# Patient Record
Sex: Female | Born: 1950
Health system: Southern US, Community
[De-identification: ages and names within clinical notes are randomized; demographics above are authoritative.]

## PROBLEM LIST (undated history)

## (undated) ENCOUNTER — Emergency Department (HOSPITAL_COMMUNITY): Payer: Medicare HMO | Source: Home / Self Care

## (undated) DIAGNOSIS — G459 Transient cerebral ischemic attack, unspecified: Secondary | ICD-10-CM

## (undated) DIAGNOSIS — D573 Sickle-cell trait: Secondary | ICD-10-CM

## (undated) DIAGNOSIS — I219 Acute myocardial infarction, unspecified: Secondary | ICD-10-CM

## (undated) DIAGNOSIS — K219 Gastro-esophageal reflux disease without esophagitis: Secondary | ICD-10-CM

## (undated) DIAGNOSIS — M1990 Unspecified osteoarthritis, unspecified site: Secondary | ICD-10-CM

## (undated) DIAGNOSIS — G629 Polyneuropathy, unspecified: Secondary | ICD-10-CM

## (undated) DIAGNOSIS — C801 Malignant (primary) neoplasm, unspecified: Secondary | ICD-10-CM

## (undated) DIAGNOSIS — I10 Essential (primary) hypertension: Secondary | ICD-10-CM

## (undated) DIAGNOSIS — G4730 Sleep apnea, unspecified: Secondary | ICD-10-CM

## (undated) DIAGNOSIS — E785 Hyperlipidemia, unspecified: Secondary | ICD-10-CM

## (undated) DIAGNOSIS — R0600 Dyspnea, unspecified: Secondary | ICD-10-CM

## (undated) HISTORY — PX: COLON SURGERY: SHX602

## (undated) HISTORY — DX: Polyneuropathy, unspecified: G62.9

## (undated) HISTORY — PX: HERNIA REPAIR: SHX51

## (undated) HISTORY — DX: Unspecified osteoarthritis, unspecified site: M19.90

## (undated) HISTORY — PX: TOTAL KNEE ARTHROPLASTY: SHX125

## (undated) HISTORY — DX: Gastro-esophageal reflux disease without esophagitis: K21.9

## (undated) HISTORY — PX: TOTAL HIP ARTHROPLASTY: SHX124

## (undated) HISTORY — PX: APPENDECTOMY: SHX54

## (undated) HISTORY — PX: SHOULDER SURGERY: SHX246

## (undated) HISTORY — DX: Hyperlipidemia, unspecified: E78.5

## (undated) HISTORY — DX: Sleep apnea, unspecified: G47.30

---

## 1998-07-29 ENCOUNTER — Encounter: Payer: Self-pay | Admitting: Internal Medicine

## 2004-04-13 ENCOUNTER — Emergency Department (HOSPITAL_COMMUNITY): Admission: EM | Admit: 2004-04-13 | Discharge: 2004-04-13 | Payer: Self-pay | Admitting: Emergency Medicine

## 2004-05-28 ENCOUNTER — Ambulatory Visit (HOSPITAL_COMMUNITY): Admission: RE | Admit: 2004-05-28 | Discharge: 2004-05-28 | Payer: Self-pay | Admitting: Internal Medicine

## 2004-06-25 ENCOUNTER — Ambulatory Visit: Payer: Self-pay | Admitting: Internal Medicine

## 2004-06-30 ENCOUNTER — Ambulatory Visit: Payer: Self-pay | Admitting: Internal Medicine

## 2004-07-08 ENCOUNTER — Emergency Department (HOSPITAL_COMMUNITY): Admission: EM | Admit: 2004-07-08 | Discharge: 2004-07-08 | Payer: Self-pay | Admitting: Emergency Medicine

## 2004-12-06 ENCOUNTER — Ambulatory Visit: Payer: Self-pay | Admitting: Internal Medicine

## 2004-12-13 ENCOUNTER — Ambulatory Visit: Payer: Self-pay | Admitting: Internal Medicine

## 2005-03-14 ENCOUNTER — Other Ambulatory Visit: Admission: RE | Admit: 2005-03-14 | Discharge: 2005-03-14 | Payer: Self-pay | Admitting: Obstetrics and Gynecology

## 2005-03-15 ENCOUNTER — Ambulatory Visit: Payer: Self-pay | Admitting: Internal Medicine

## 2005-04-01 ENCOUNTER — Ambulatory Visit (HOSPITAL_COMMUNITY): Admission: RE | Admit: 2005-04-01 | Discharge: 2005-04-01 | Payer: Self-pay | Admitting: Obstetrics and Gynecology

## 2005-04-15 ENCOUNTER — Ambulatory Visit: Payer: Self-pay | Admitting: Internal Medicine

## 2005-05-06 ENCOUNTER — Encounter: Admission: RE | Admit: 2005-05-06 | Discharge: 2005-05-06 | Payer: Self-pay | Admitting: Obstetrics and Gynecology

## 2005-06-21 ENCOUNTER — Ambulatory Visit: Payer: Self-pay | Admitting: Internal Medicine

## 2005-07-26 ENCOUNTER — Ambulatory Visit: Payer: Self-pay | Admitting: Internal Medicine

## 2005-07-26 IMAGING — CR DG CHEST 2V
2 series · 2 of 2 positions shown · non-contrast
Comparison: none

CLINICAL DATA: Short of breath, cough.
 CHEST 2 VIEW 
 No previous for comparison.

[view not recorded (1 of 2)]
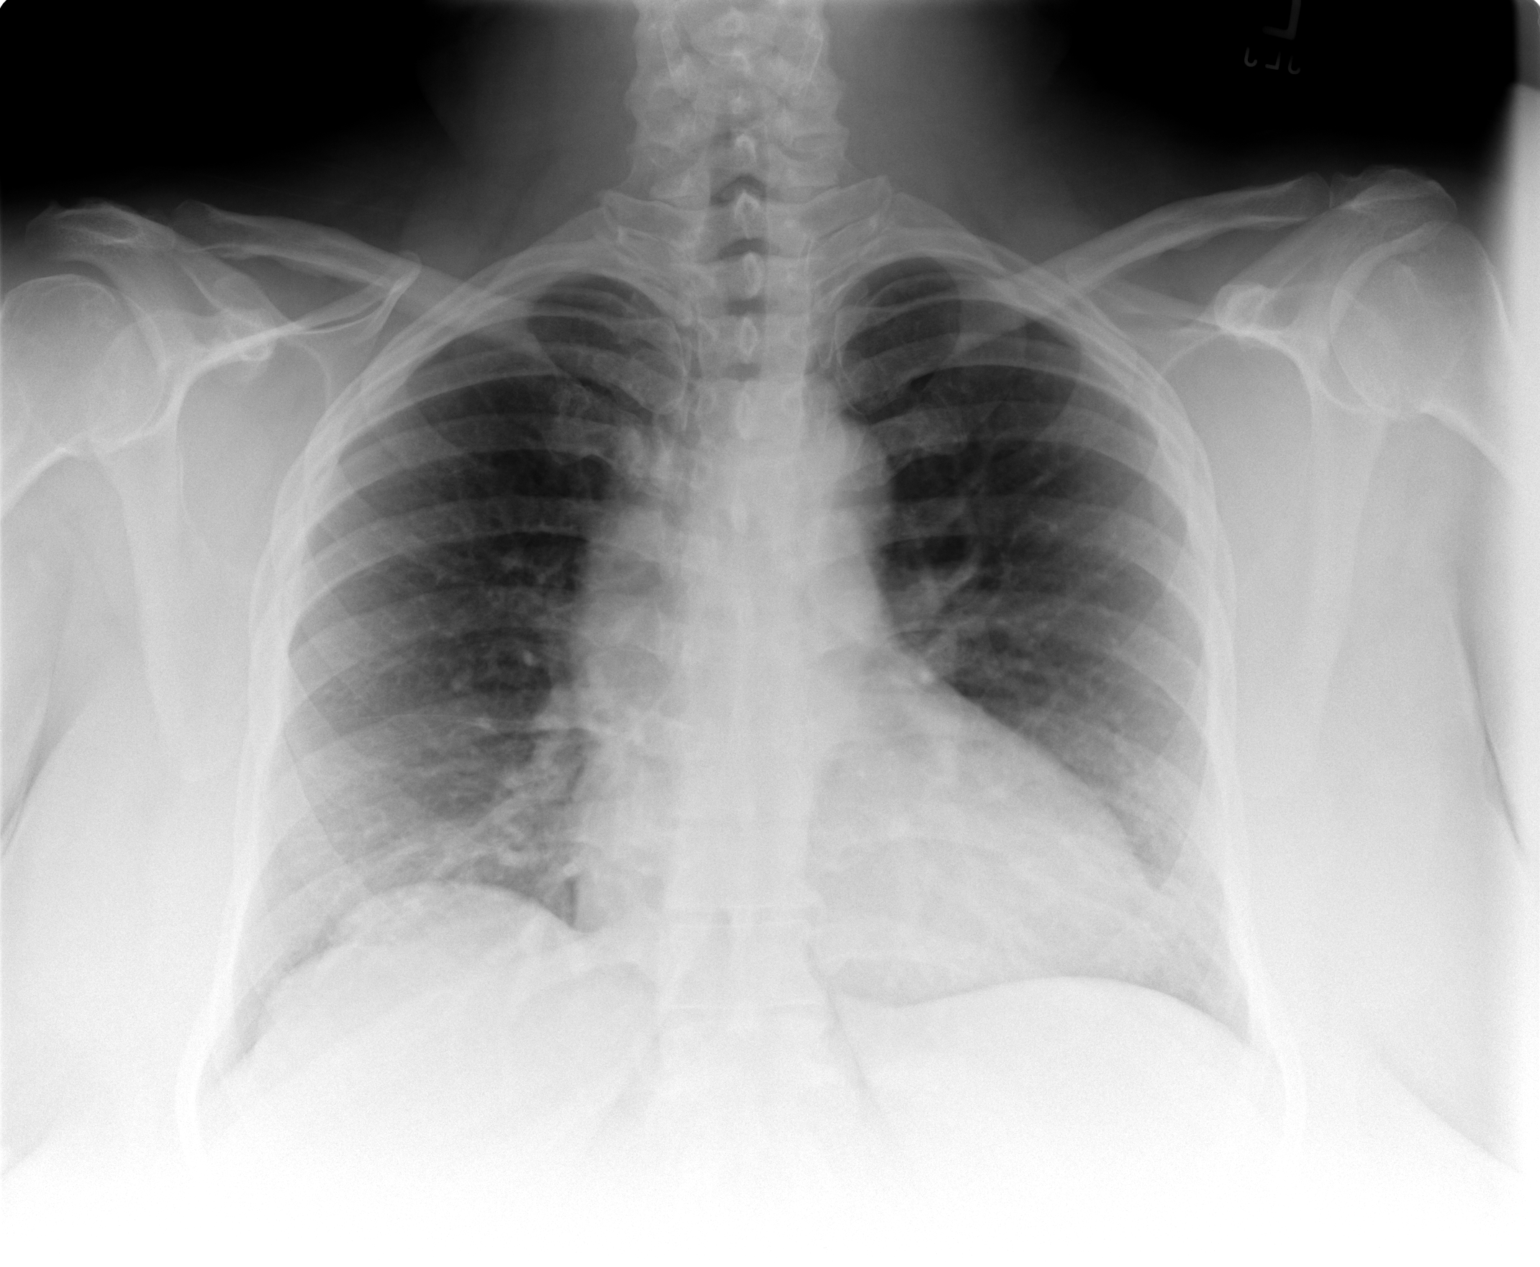

[view not recorded (2 of 2)]
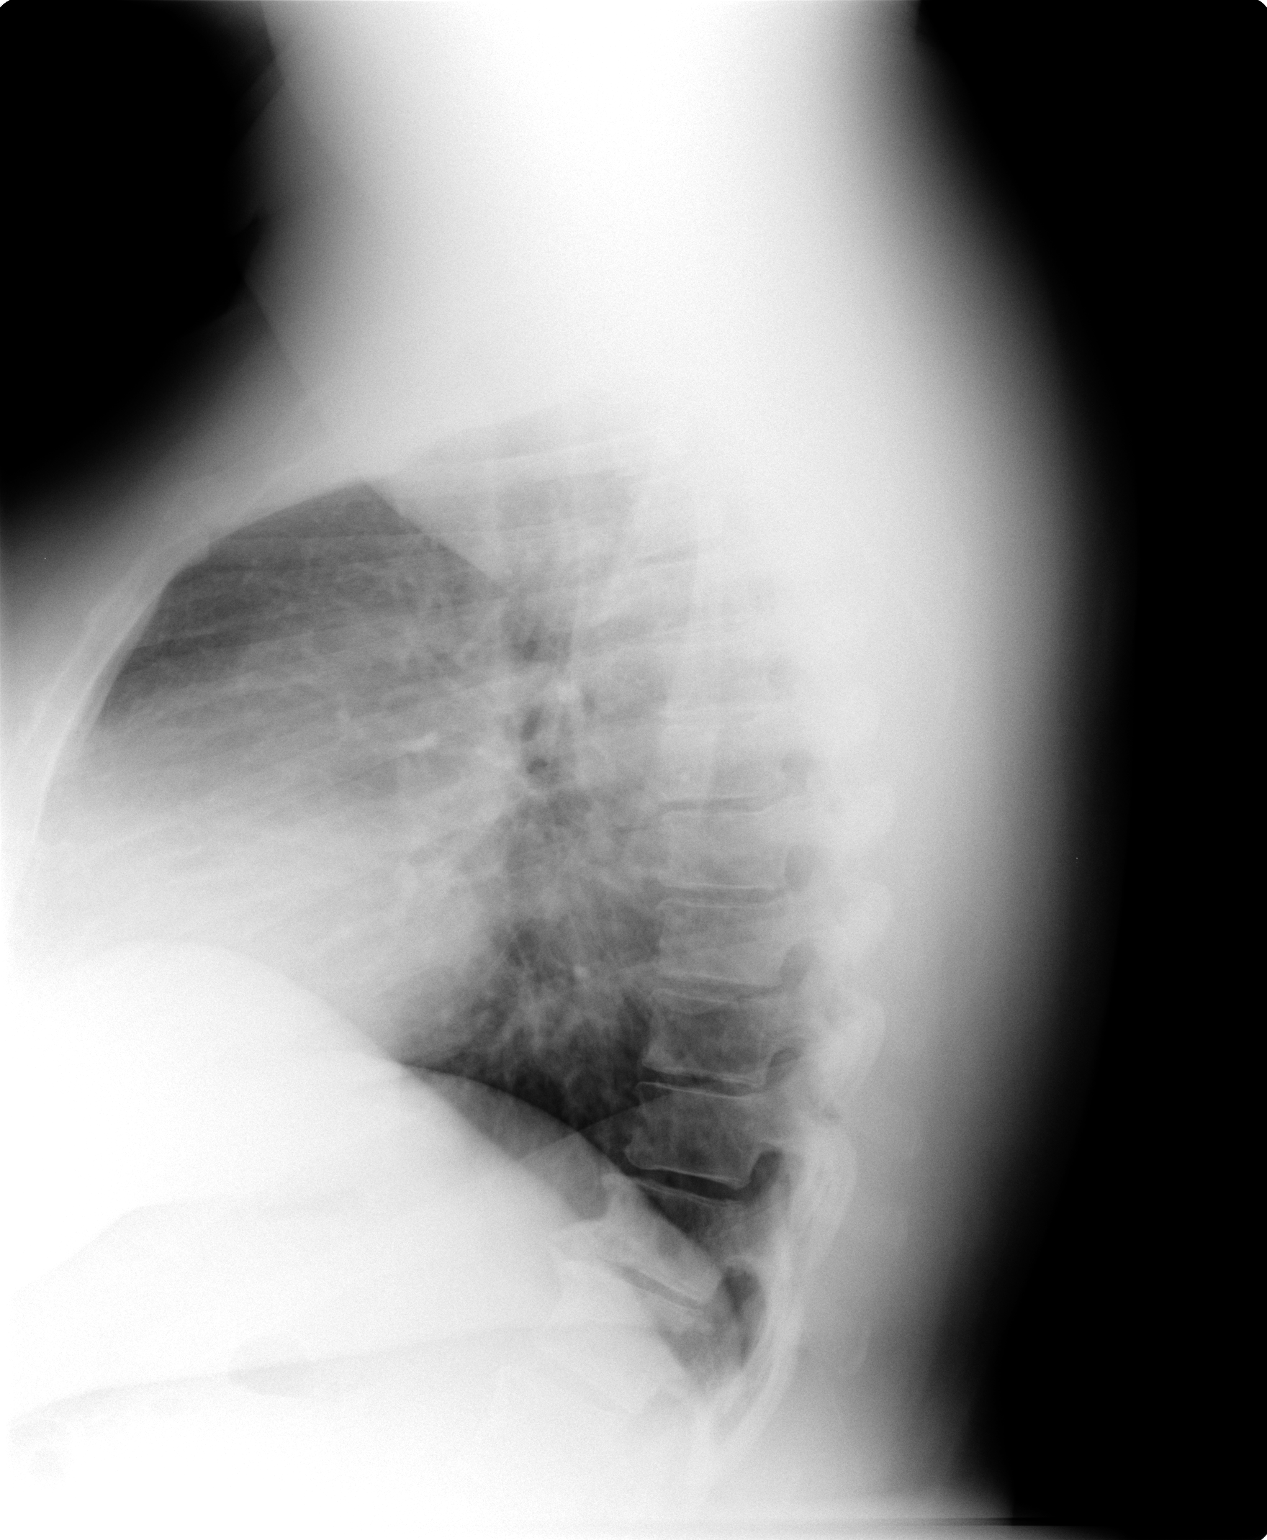

[2 of 2 positions shown; findings below may reference images not displayed]

FINDINGS: The heart size and mediastinal contours are normal.  The lungs are clear.  The visualized skeleton is unremarkable. 
 IMPRESSION
 No active disease.

## 2005-09-09 IMAGING — CR DG HIP COMPLETE 2+V*R*
3 series · 3 of 3 positions shown · non-contrast
Comparison: none

CLINICAL DATA: Right hip pain.
 THREE VIEW RIGHT HIP ? 05/28/04:

[view not recorded (1 of 3)]
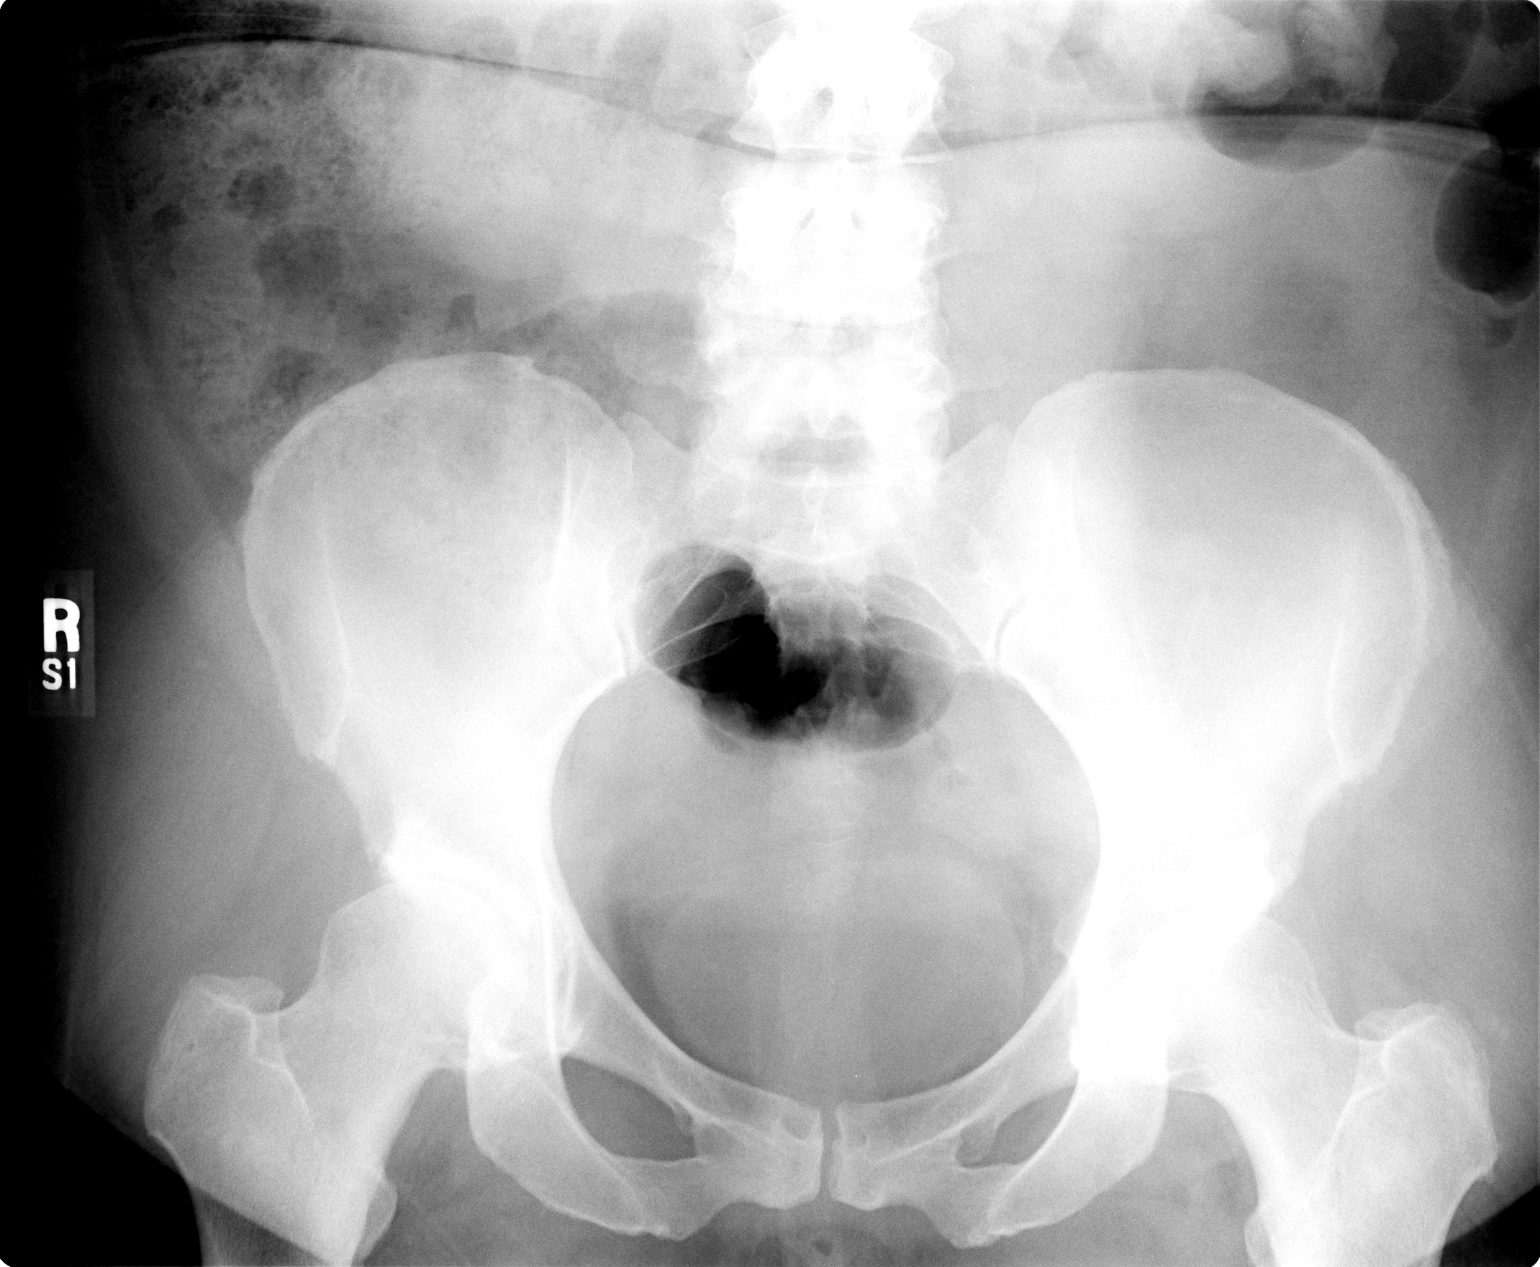

[view not recorded (2 of 3)]
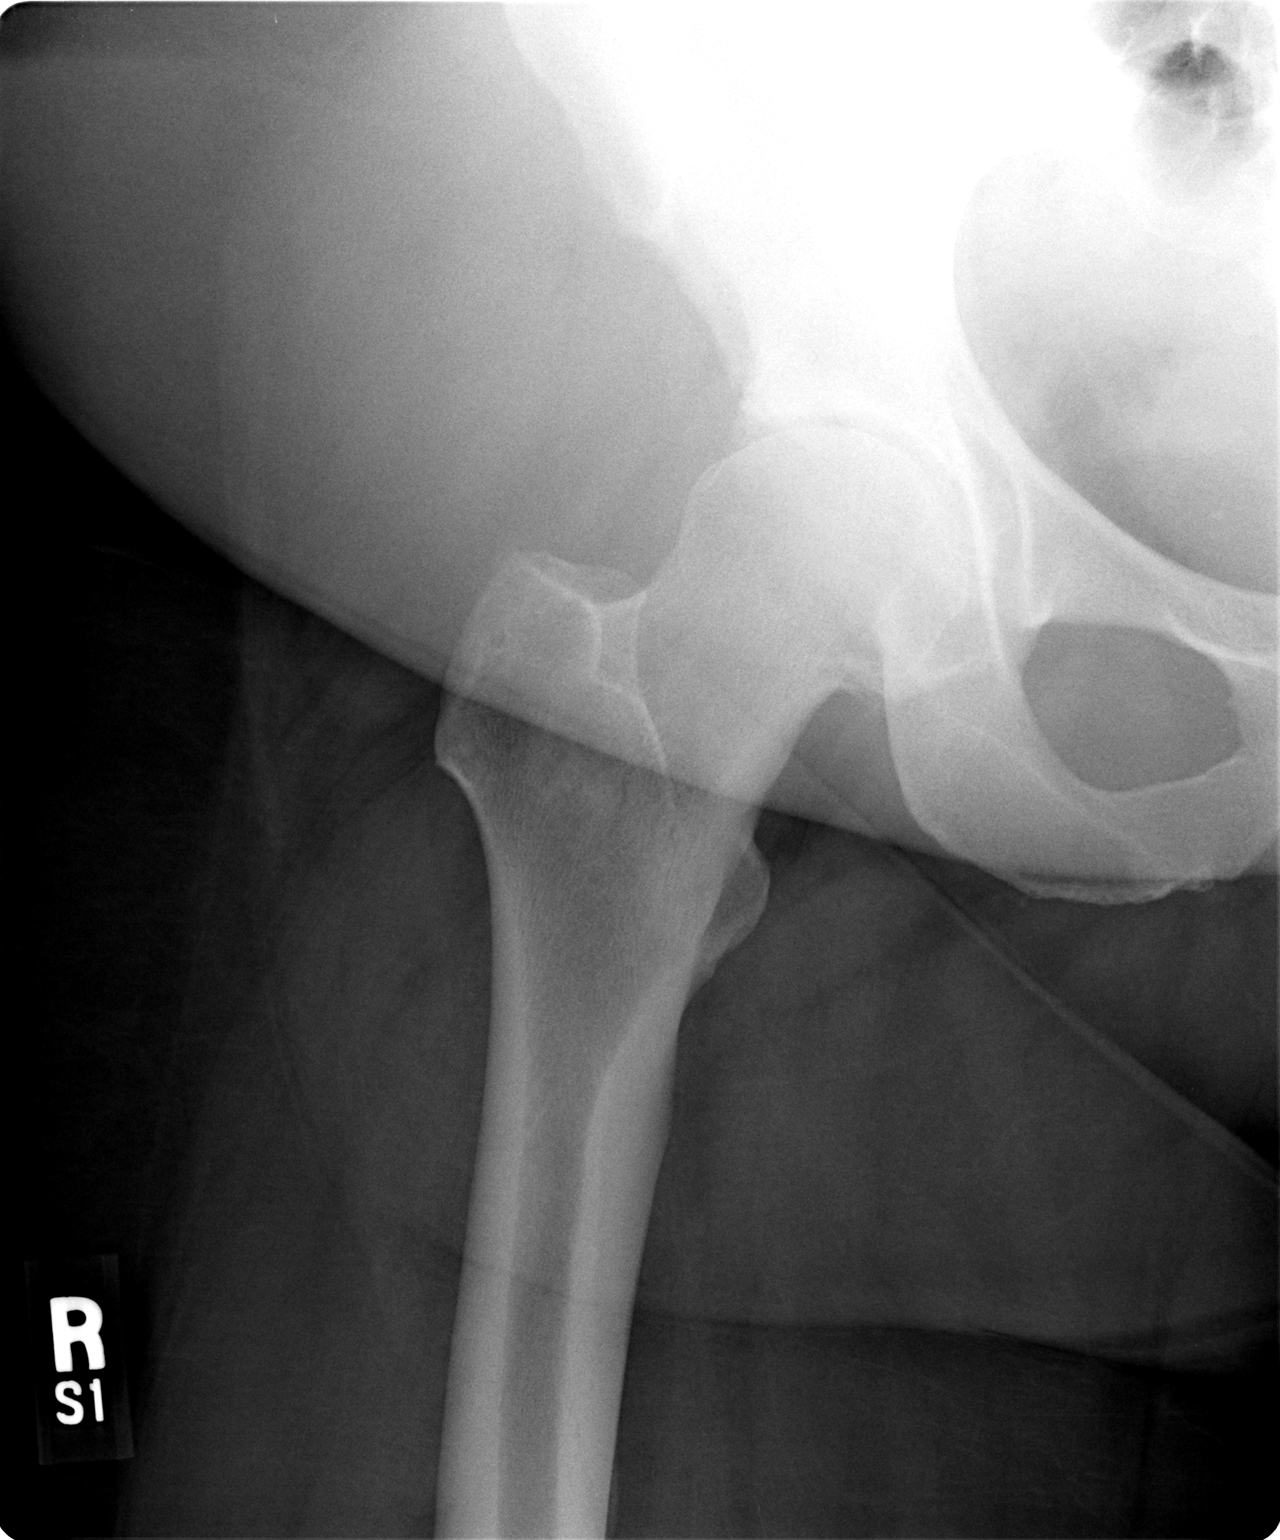

[view not recorded (3 of 3)]
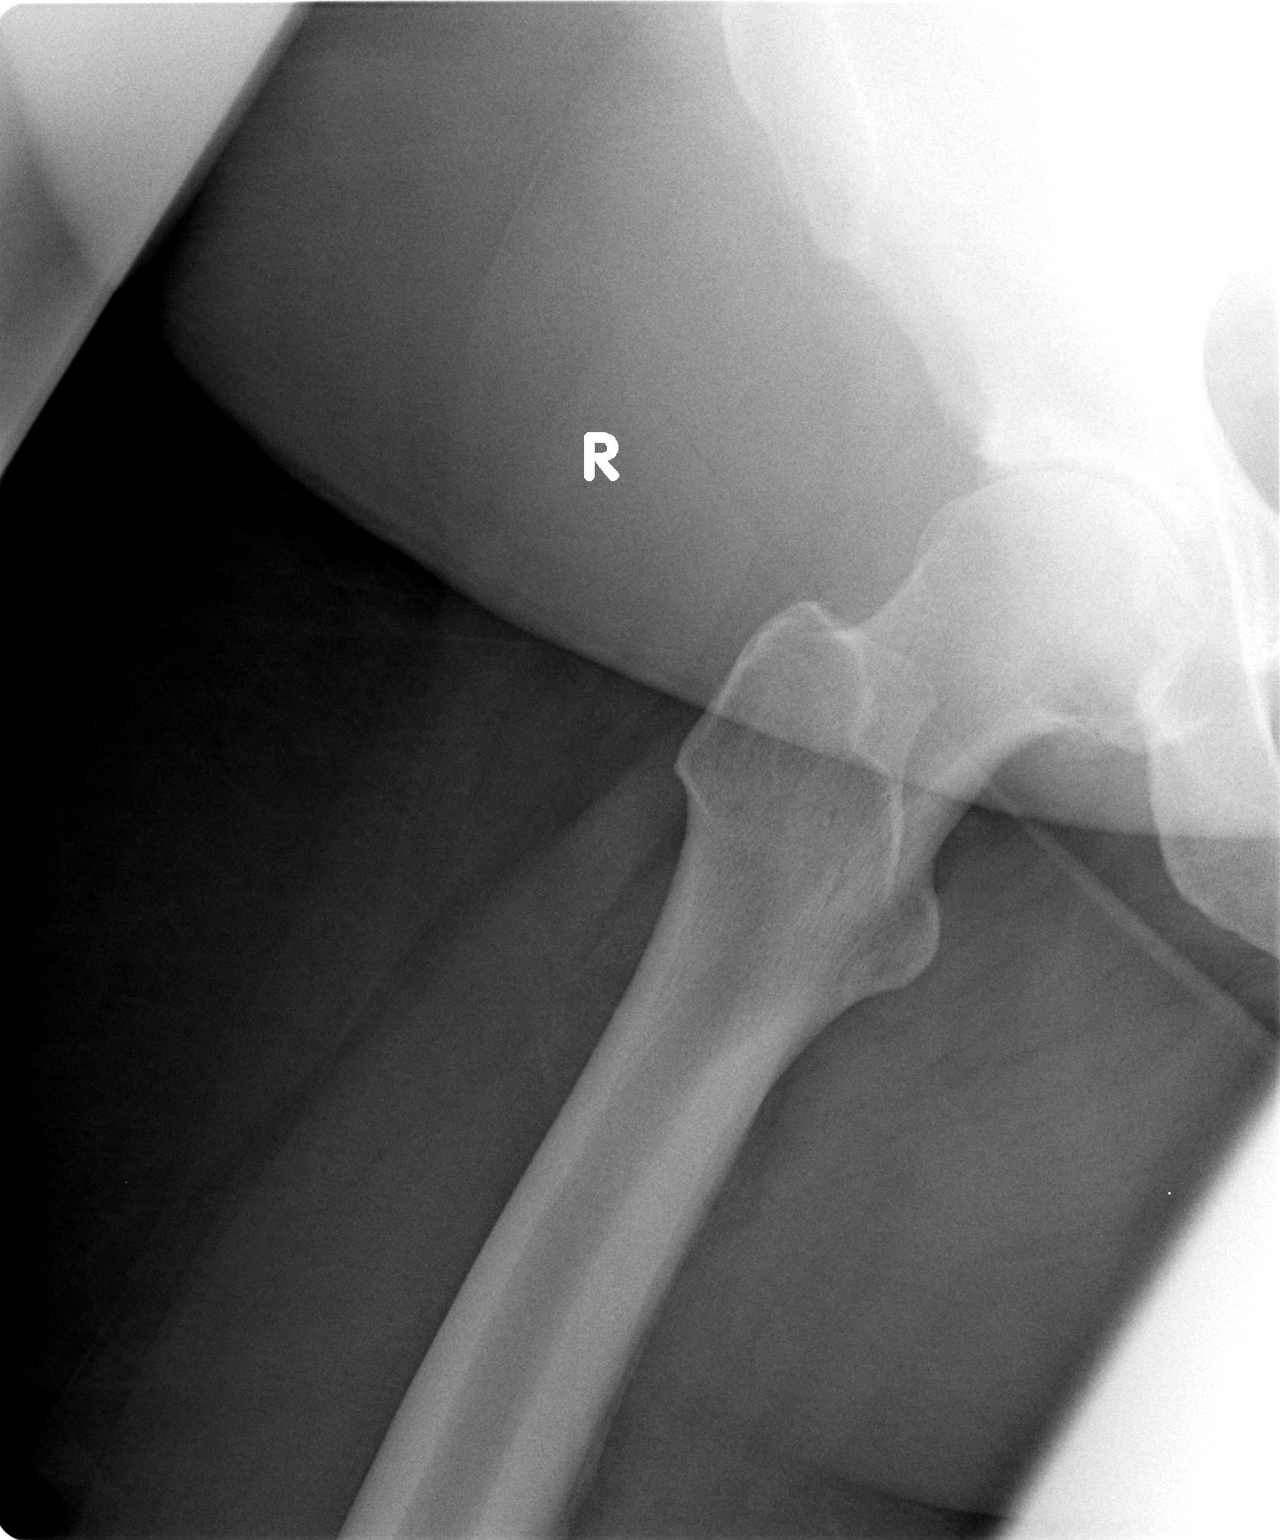

[3 of 3 positions shown; findings below may reference images not displayed]

FINDINGS: No acute fracture, subluxation, or dislocation is identified.  Moderate degenerative changes in the hips, right greater than left, are noted with joint space narrowing, osteophytosis, and juxta-articular acetabular sclerosis.  There is mild uncovering of the right femoral head.
IMPRESSION: 1.  Moderate degenerative changes in the bilateral hips, right greater than left.
 2.  No acute abnormality.

## 2005-09-13 ENCOUNTER — Encounter: Admission: RE | Admit: 2005-09-13 | Discharge: 2005-09-13 | Payer: Self-pay | Admitting: Cardiology

## 2005-10-20 IMAGING — CR DG LUMBAR SPINE COMPLETE 4+V
5 series · 5 of 5 positions shown · non-contrast
Comparison: none

CLINICAL DATA: Left-sided buttock to leg pain for two days.  
 LUMBAR SPINE ? 4 VIEWS:
 There are five lumbar type vertebral bodies in normal alignment.  The patient has advanced degenerative facet disease at L4-5 and L5-S1 and moderate facet degeneration at L3-4.  There is anterolisthesis of L4 on L5 of 2 mm.  There is mild disk space narrowing at L4-5.

[view not recorded (1 of 5)]
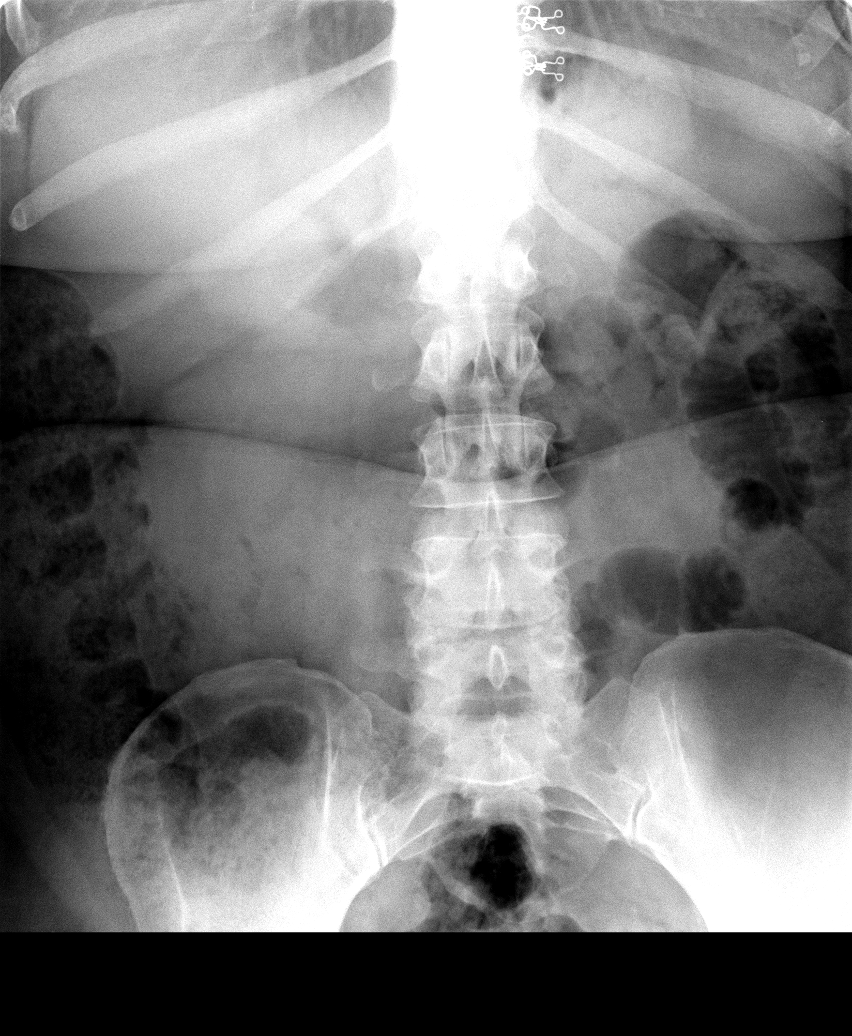

[view not recorded (2 of 5)]
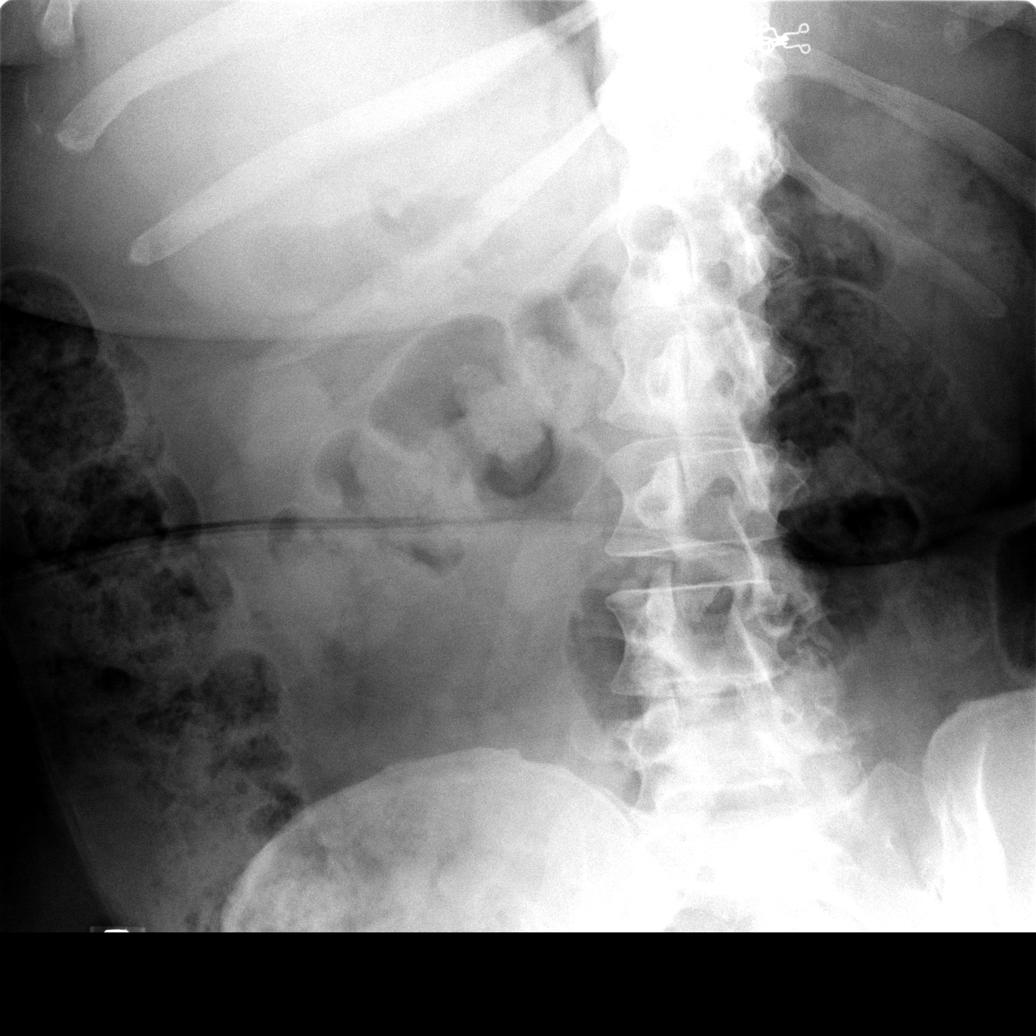

[view not recorded (3 of 5)]
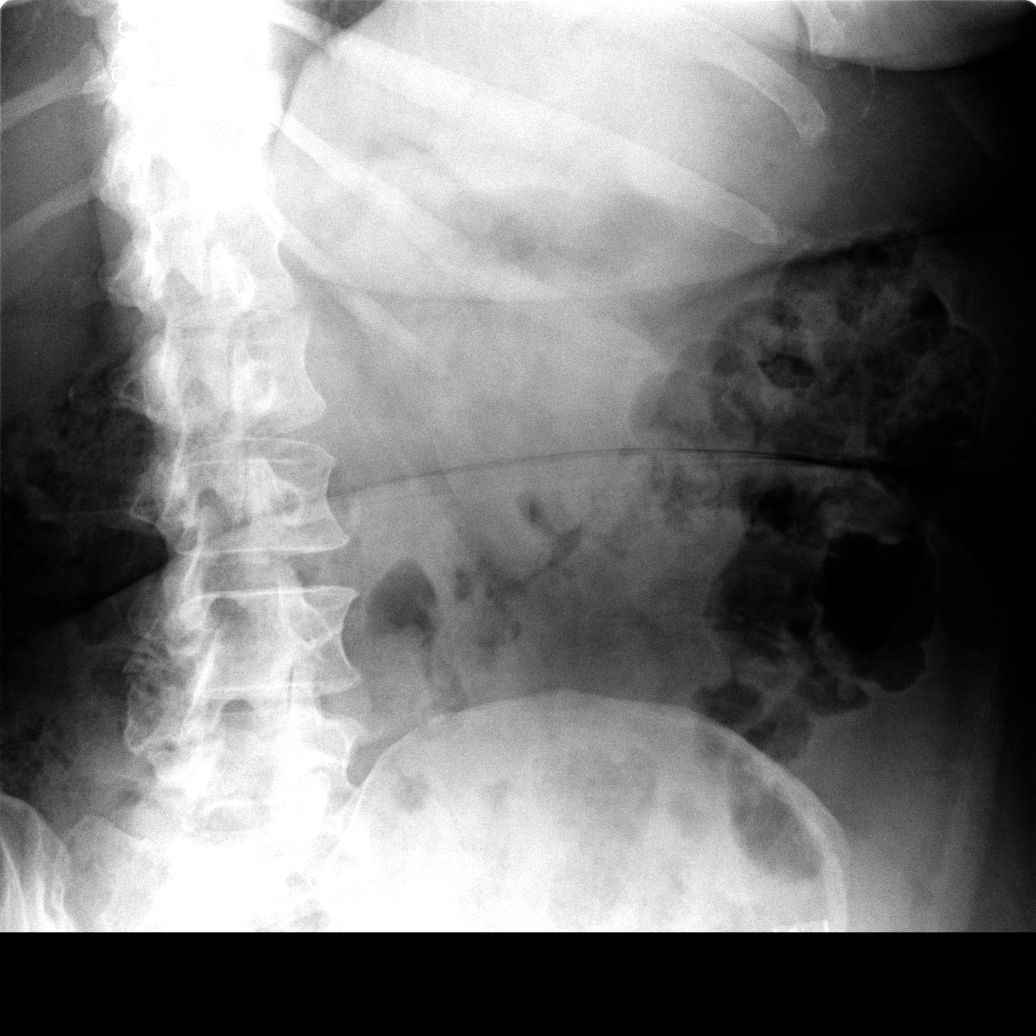

[view not recorded (4 of 5)]
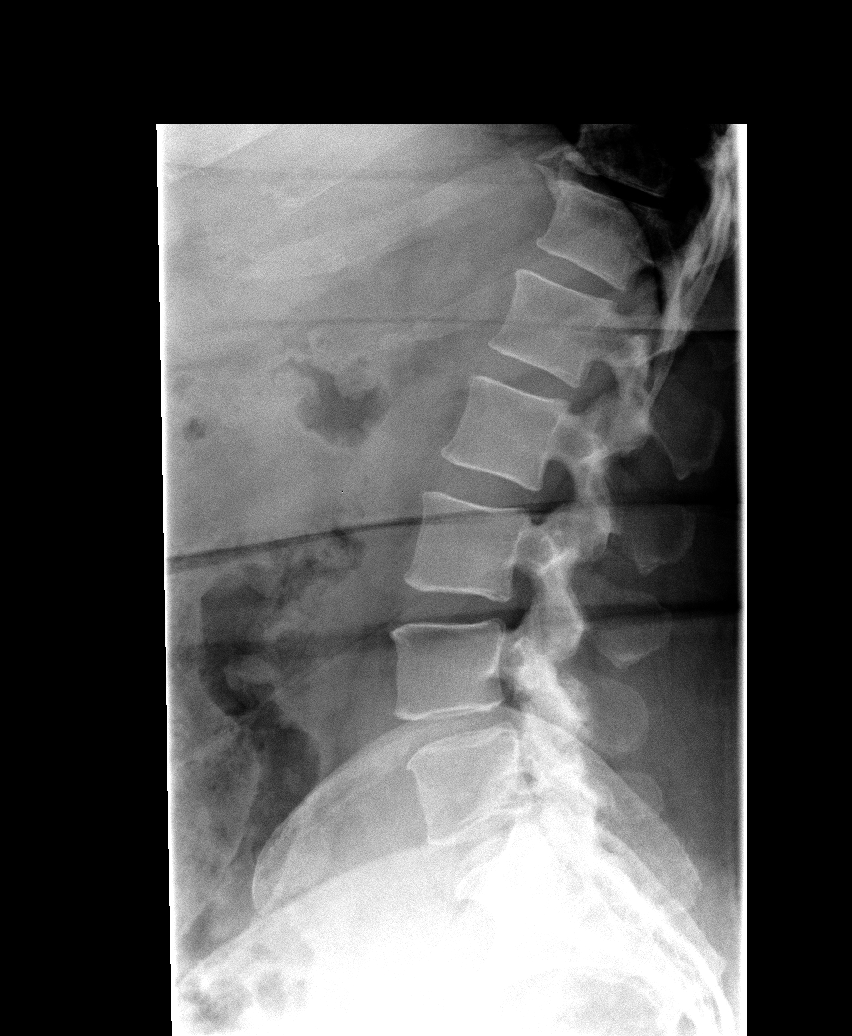

[view not recorded (5 of 5)]
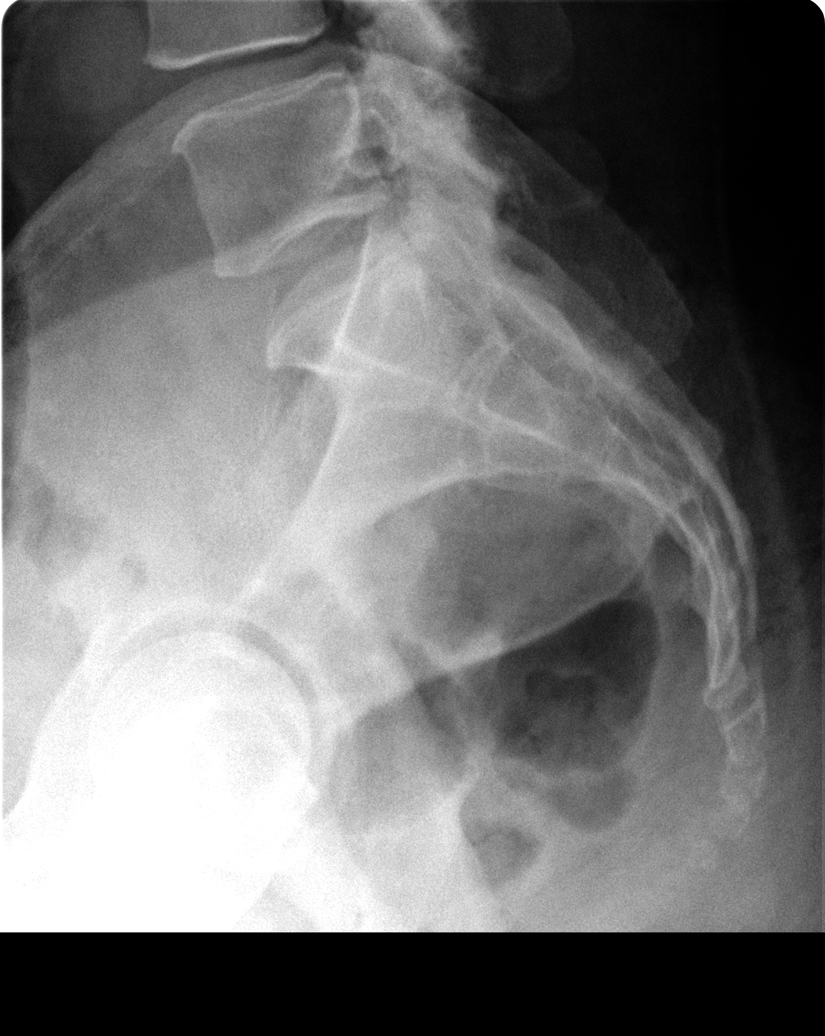

[5 of 5 positions shown; findings below may reference images not displayed]

IMPRESSION: Advanced degenerative facet disease as described above.

## 2006-03-29 ENCOUNTER — Ambulatory Visit (HOSPITAL_BASED_OUTPATIENT_CLINIC_OR_DEPARTMENT_OTHER): Admission: RE | Admit: 2006-03-29 | Discharge: 2006-03-29 | Payer: Self-pay | Admitting: Cardiology

## 2006-04-01 ENCOUNTER — Ambulatory Visit: Payer: Self-pay | Admitting: Internal Medicine

## 2006-05-16 ENCOUNTER — Encounter: Admission: RE | Admit: 2006-05-16 | Discharge: 2006-05-16 | Payer: Self-pay | Admitting: Cardiology

## 2006-10-24 ENCOUNTER — Encounter (HOSPITAL_COMMUNITY): Admission: RE | Admit: 2006-10-24 | Discharge: 2006-10-25 | Payer: Self-pay | Admitting: Cardiology

## 2006-12-26 IMAGING — CR DG CHEST 2V
2 series · 2 of 2 positions shown · non-contrast
Comparison: [REDACTED] chest x-ray, 04/13/04

CLINICAL DATA: Asthma.  Upper back pain.  Spasm.  Diabetes.  History of bronchitis. 
 DIAGNOSTIC CHEST ? TWO VIEWS:

[view not recorded (1 of 2)]
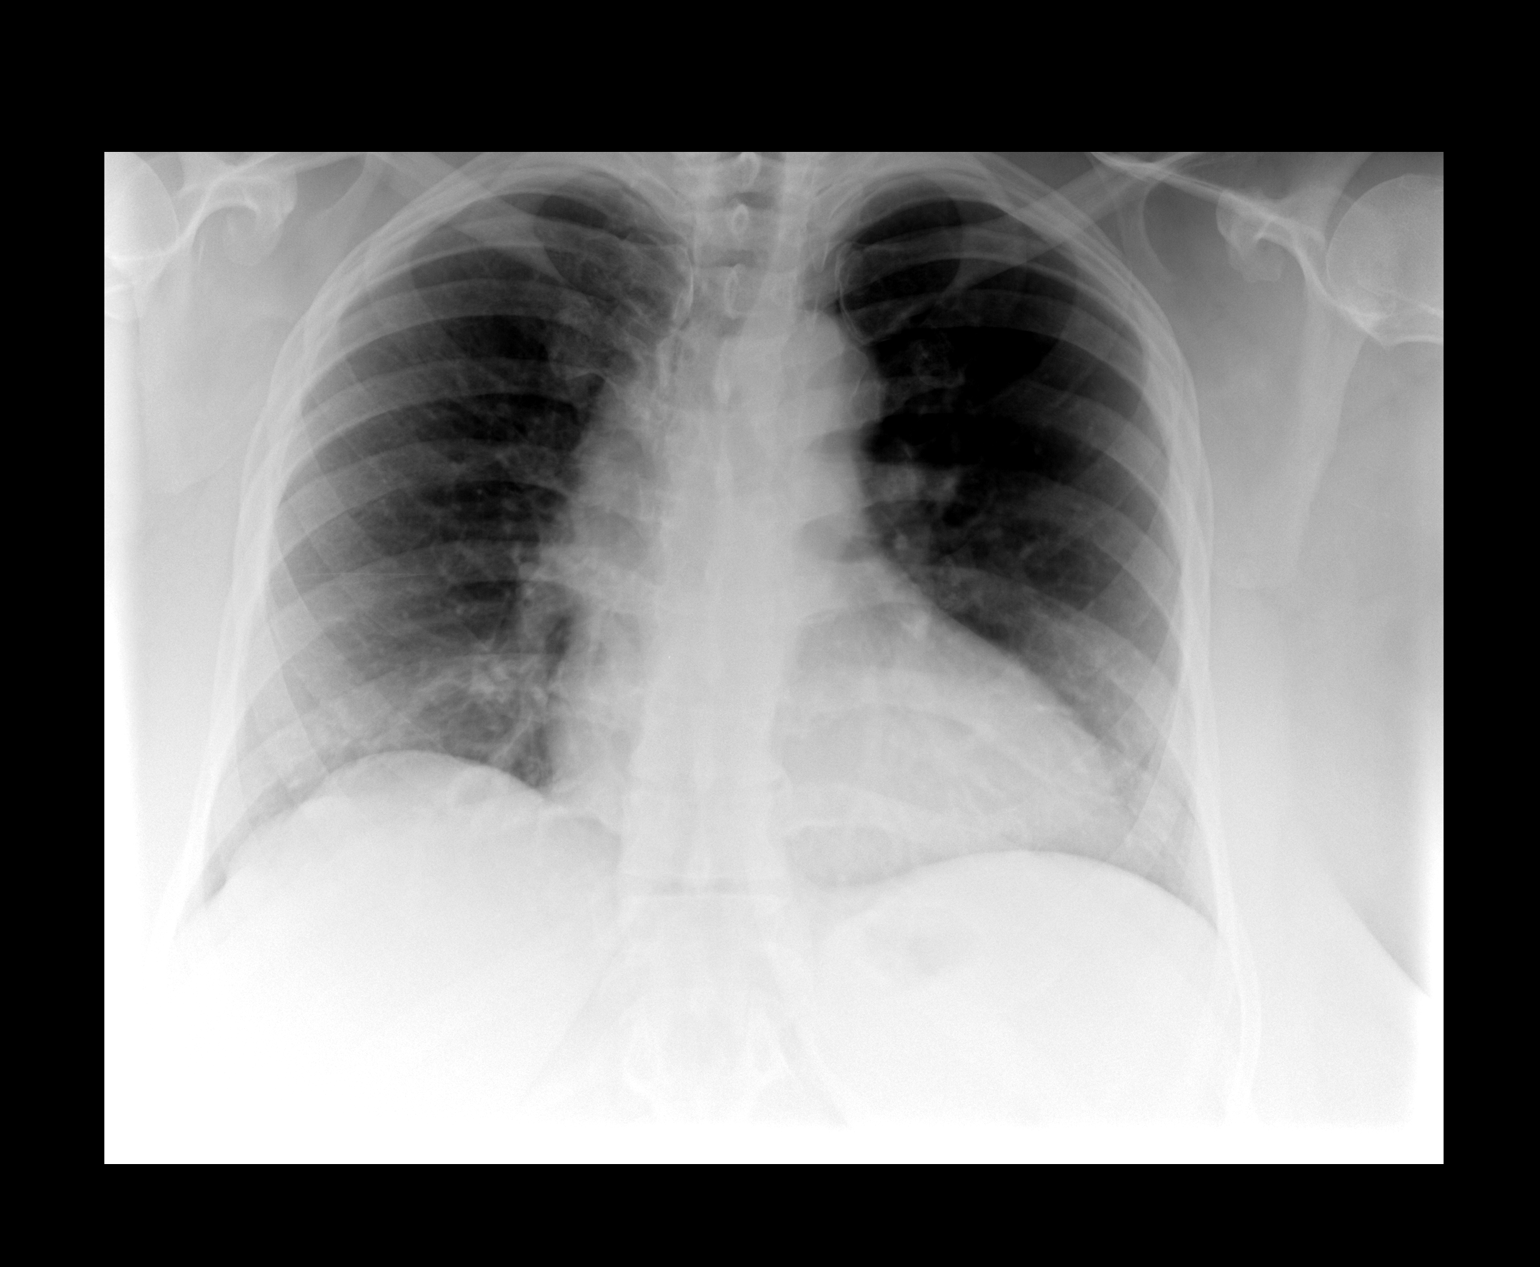

[view not recorded (2 of 2)]
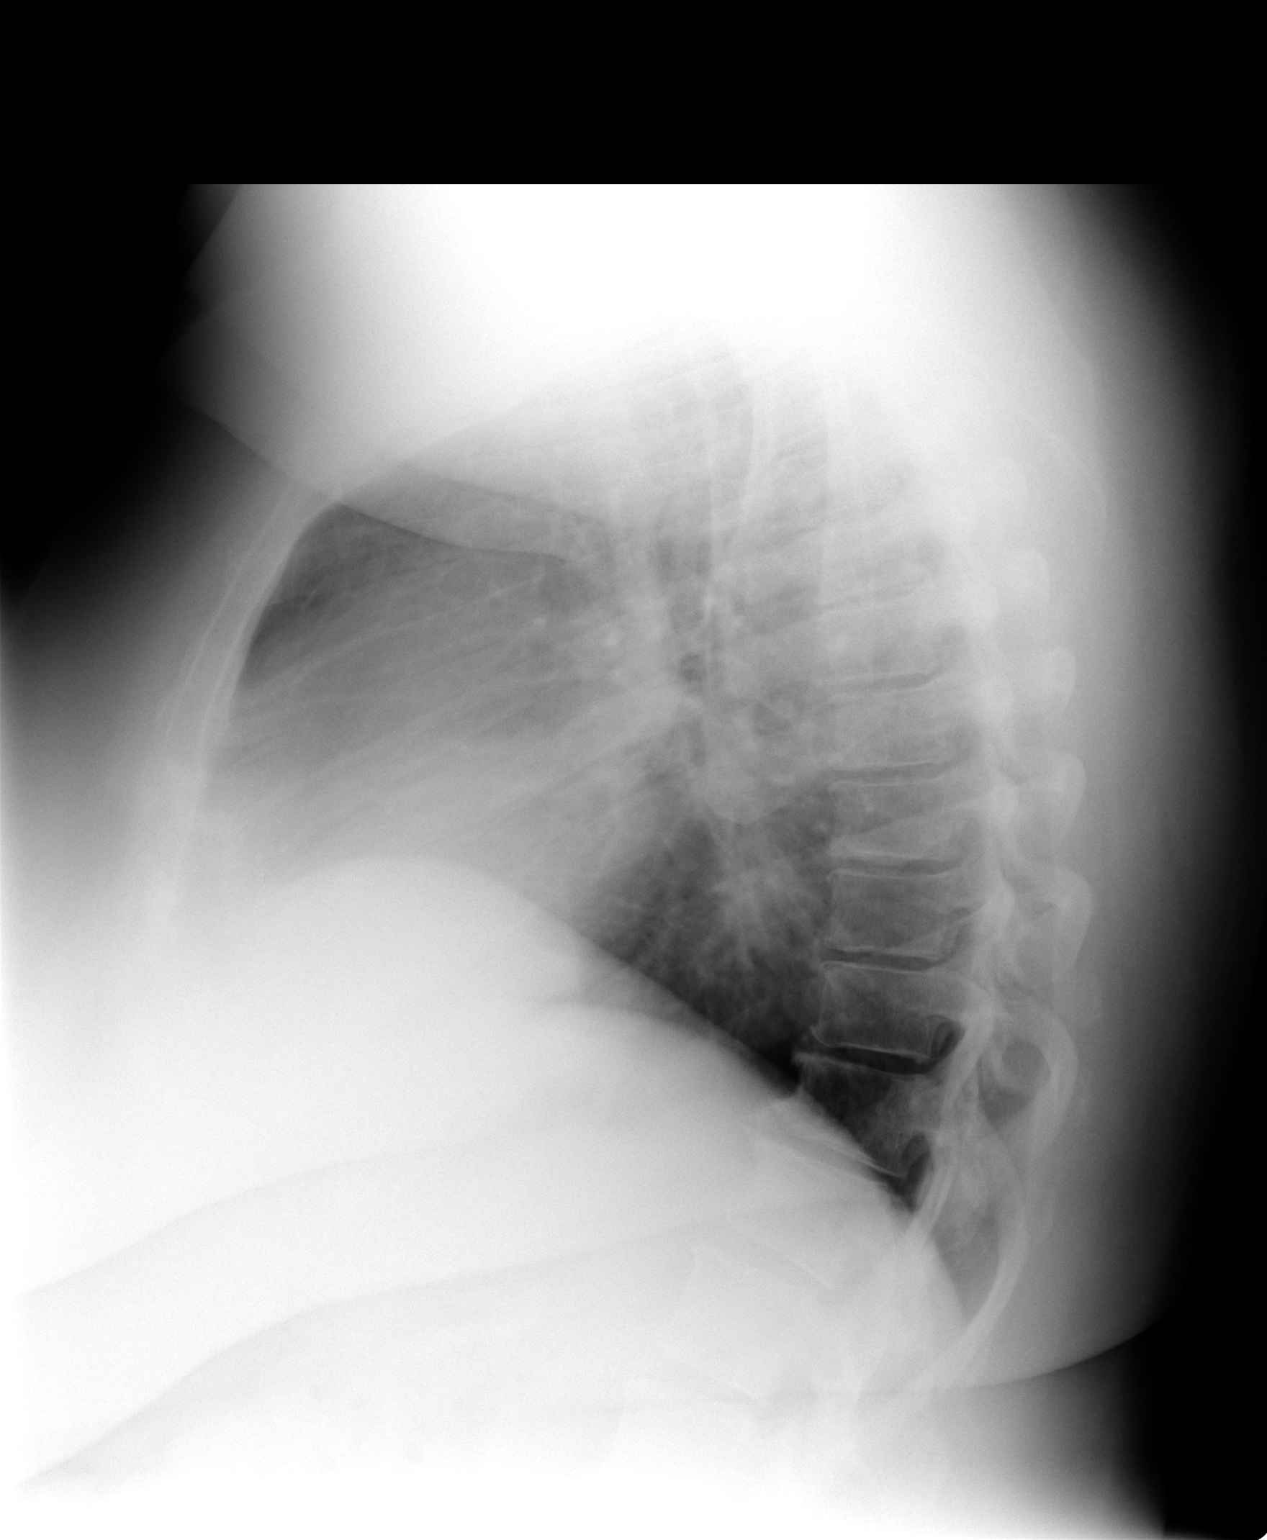

[2 of 2 positions shown; findings below may reference images not displayed]

FINDINGS: Submaximal inspiration with borderline cardiomegaly are stable with clear lungs.  No new abnormality is seen.
IMPRESSION: No active disease.

## 2007-03-21 ENCOUNTER — Encounter: Admission: RE | Admit: 2007-03-21 | Discharge: 2007-03-21 | Payer: Self-pay | Admitting: Cardiology

## 2007-05-31 ENCOUNTER — Ambulatory Visit (HOSPITAL_COMMUNITY): Admission: RE | Admit: 2007-05-31 | Discharge: 2007-05-31 | Payer: Self-pay | Admitting: Cardiology

## 2007-06-27 ENCOUNTER — Encounter: Payer: Self-pay | Admitting: Internal Medicine

## 2007-06-27 DIAGNOSIS — I10 Essential (primary) hypertension: Secondary | ICD-10-CM

## 2007-06-27 DIAGNOSIS — E785 Hyperlipidemia, unspecified: Secondary | ICD-10-CM | POA: Insufficient documentation

## 2007-06-27 DIAGNOSIS — G40301 Generalized idiopathic epilepsy and epileptic syndromes, not intractable, with status epilepticus: Secondary | ICD-10-CM | POA: Insufficient documentation

## 2007-06-27 DIAGNOSIS — M1990 Unspecified osteoarthritis, unspecified site: Secondary | ICD-10-CM | POA: Insufficient documentation

## 2007-06-27 DIAGNOSIS — G4733 Obstructive sleep apnea (adult) (pediatric): Secondary | ICD-10-CM | POA: Insufficient documentation

## 2007-06-27 DIAGNOSIS — J45909 Unspecified asthma, uncomplicated: Secondary | ICD-10-CM | POA: Insufficient documentation

## 2007-08-03 ENCOUNTER — Encounter: Admission: RE | Admit: 2007-08-03 | Discharge: 2007-08-03 | Payer: Self-pay | Admitting: Cardiology

## 2007-09-03 ENCOUNTER — Emergency Department (HOSPITAL_COMMUNITY): Admission: EM | Admit: 2007-09-03 | Discharge: 2007-09-04 | Payer: Self-pay | Admitting: Emergency Medicine

## 2007-10-14 ENCOUNTER — Inpatient Hospital Stay (HOSPITAL_COMMUNITY): Admission: EM | Admit: 2007-10-14 | Discharge: 2007-10-20 | Payer: Self-pay | Admitting: Emergency Medicine

## 2007-10-14 ENCOUNTER — Ambulatory Visit: Payer: Self-pay | Admitting: Internal Medicine

## 2007-10-17 ENCOUNTER — Encounter (INDEPENDENT_AMBULATORY_CARE_PROVIDER_SITE_OTHER): Payer: Self-pay | Admitting: Cardiology

## 2007-10-19 ENCOUNTER — Ambulatory Visit: Payer: Self-pay | Admitting: Vascular Surgery

## 2007-10-19 ENCOUNTER — Encounter (INDEPENDENT_AMBULATORY_CARE_PROVIDER_SITE_OTHER): Payer: Self-pay | Admitting: Cardiology

## 2007-11-01 ENCOUNTER — Encounter: Admission: RE | Admit: 2007-11-01 | Discharge: 2007-11-01 | Payer: Self-pay | Admitting: Cardiology

## 2007-12-07 ENCOUNTER — Ambulatory Visit: Payer: Self-pay | Admitting: Critical Care Medicine

## 2007-12-07 ENCOUNTER — Inpatient Hospital Stay (HOSPITAL_COMMUNITY): Admission: EM | Admit: 2007-12-07 | Discharge: 2007-12-11 | Payer: Self-pay | Admitting: Emergency Medicine

## 2007-12-10 ENCOUNTER — Encounter: Payer: Self-pay | Admitting: Internal Medicine

## 2008-01-03 ENCOUNTER — Ambulatory Visit: Payer: Self-pay | Admitting: Internal Medicine

## 2008-01-03 DIAGNOSIS — J9610 Chronic respiratory failure, unspecified whether with hypoxia or hypercapnia: Secondary | ICD-10-CM | POA: Insufficient documentation

## 2008-01-08 ENCOUNTER — Encounter: Payer: Self-pay | Admitting: Pulmonary Disease

## 2008-01-08 ENCOUNTER — Ambulatory Visit (HOSPITAL_BASED_OUTPATIENT_CLINIC_OR_DEPARTMENT_OTHER): Admission: RE | Admit: 2008-01-08 | Discharge: 2008-01-08 | Payer: Self-pay | Admitting: Internal Medicine

## 2008-01-15 ENCOUNTER — Encounter: Payer: Self-pay | Admitting: Internal Medicine

## 2008-01-15 ENCOUNTER — Ambulatory Visit: Payer: Self-pay | Admitting: Pulmonary Disease

## 2008-01-23 ENCOUNTER — Encounter: Payer: Self-pay | Admitting: Internal Medicine

## 2008-02-04 ENCOUNTER — Ambulatory Visit: Payer: Self-pay | Admitting: Internal Medicine

## 2008-03-21 ENCOUNTER — Telehealth (INDEPENDENT_AMBULATORY_CARE_PROVIDER_SITE_OTHER): Payer: Self-pay | Admitting: *Deleted

## 2008-04-16 ENCOUNTER — Ambulatory Visit (HOSPITAL_COMMUNITY): Admission: RE | Admit: 2008-04-16 | Discharge: 2008-04-16 | Payer: Self-pay | Admitting: Cardiology

## 2008-04-19 ENCOUNTER — Inpatient Hospital Stay (HOSPITAL_COMMUNITY): Admission: EM | Admit: 2008-04-19 | Discharge: 2008-04-23 | Payer: Self-pay | Admitting: Emergency Medicine

## 2008-07-02 IMAGING — MR MR CERVICAL SPINE W/O CM
6 of 7 series · 21 of 48 positions shown · IV contrast (agent unspecified)
Comparison: none

CLINICAL DATA: Left sided neck pain radiating to the left shoulder and arm. 
 MRI CERVICAL SPINE WITHOUT CONTRAST:
TECHNIQUE: Multiplanar and multiecho pulse sequences of the cervical spine, to include the craniocervical junction and cervicothoracic junction, were obtained according to standard protocol without IV contrast.

[Series 3: T2 · sagittal · 3.3mm · 0.43mm/px · 4 of 11 slices shown (1 of 2)]
[im 1/11]
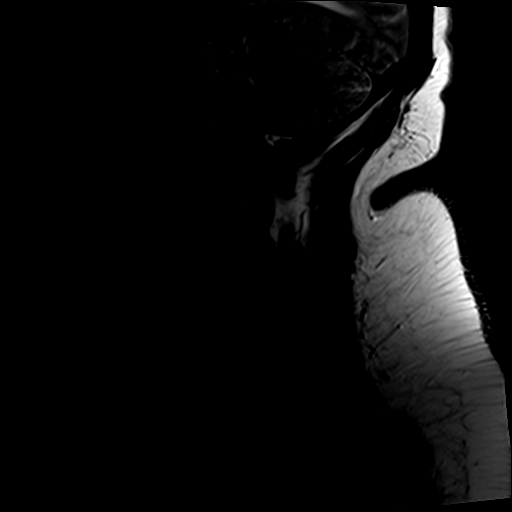
[im 4/11]
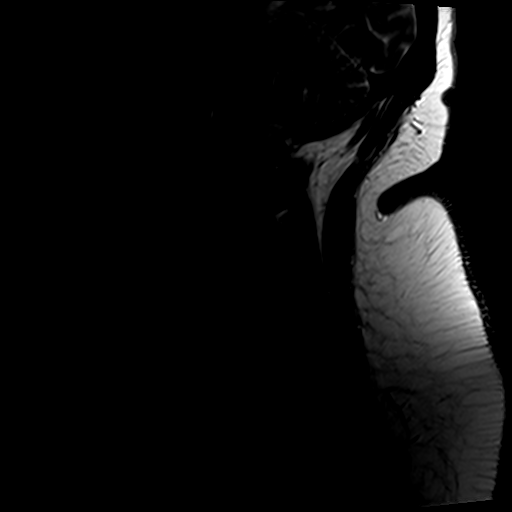
[im 7/11]
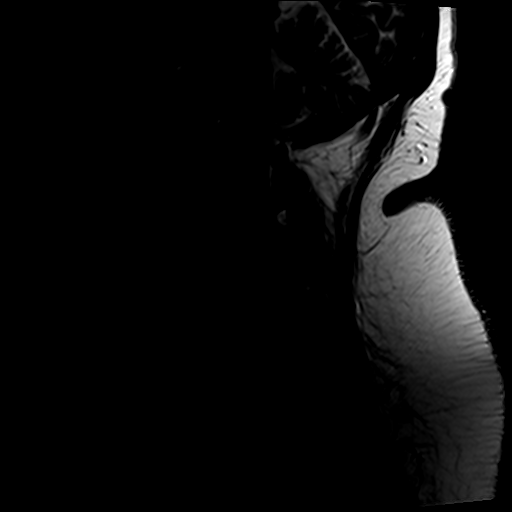
[im 11/11]
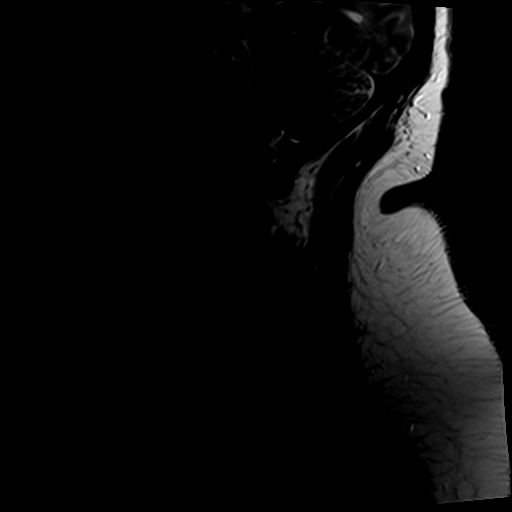

[Series 4: T1 · sagittal · 3.3mm · 0.43mm/px · 3 of 11 slices shown]
[im 1/11]
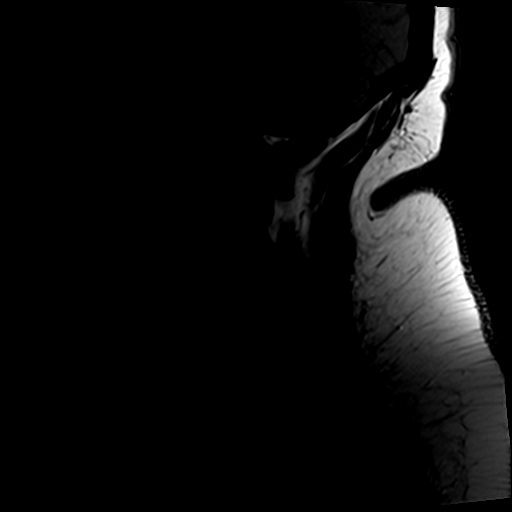
[im 6/11]
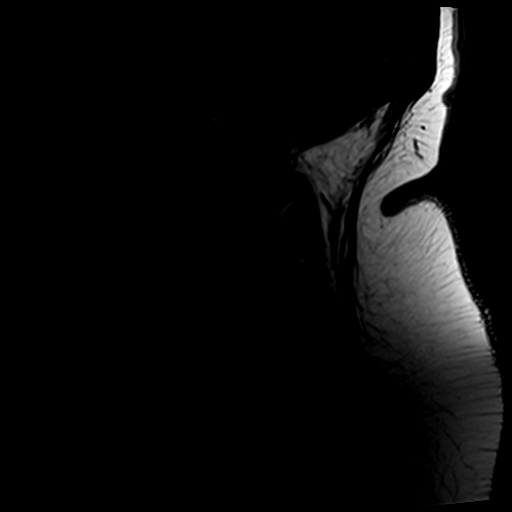
[im 11/11]
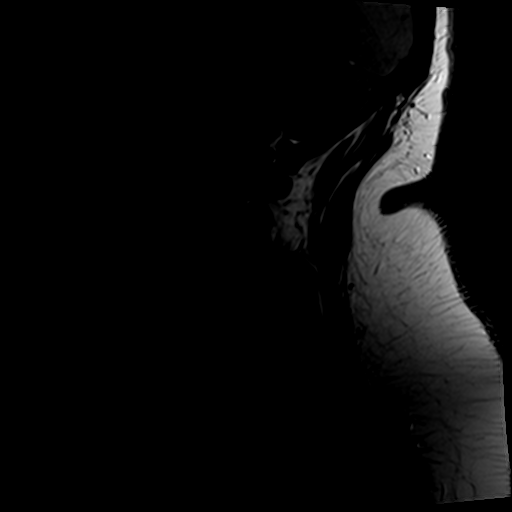

[Series 5: PD · sagittal · 3.3mm · 0.43mm/px · 3 of 11 slices shown]
[im 1/11]
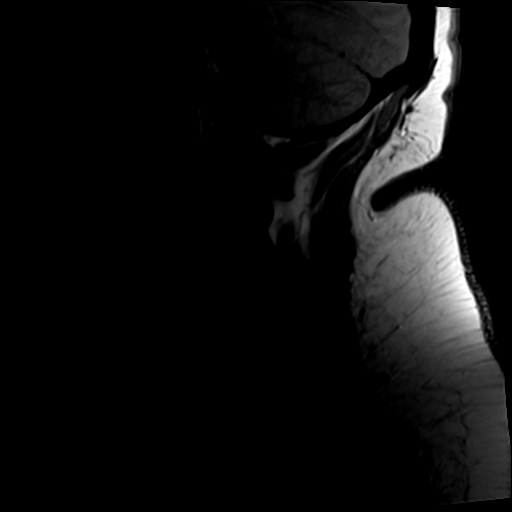
[im 6/11]
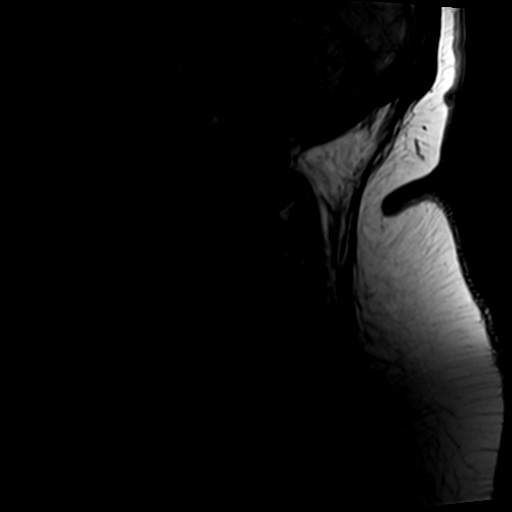
[im 11/11]
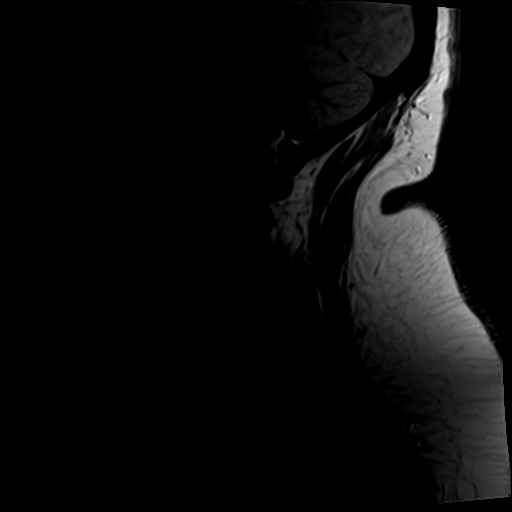

[Series 6: STIR · sagittal · 3.5mm · 0.43mm/px · 3 of 11 slices shown]
[im 1/11]
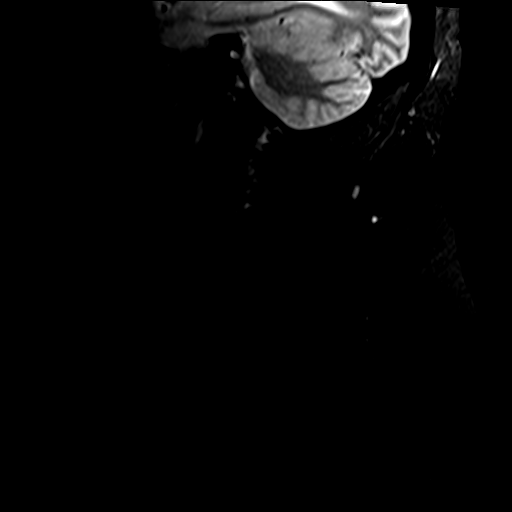
[im 6/11]
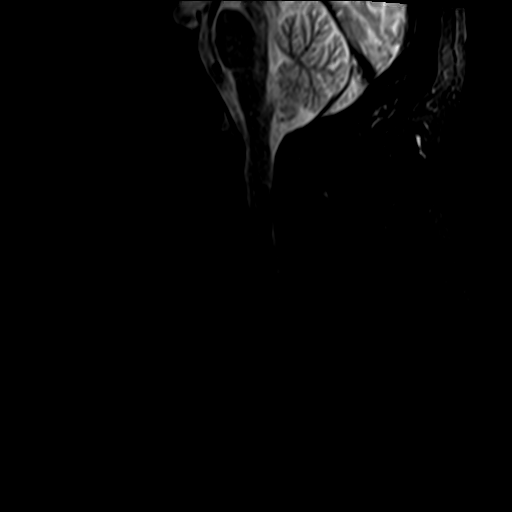
[im 11/11]
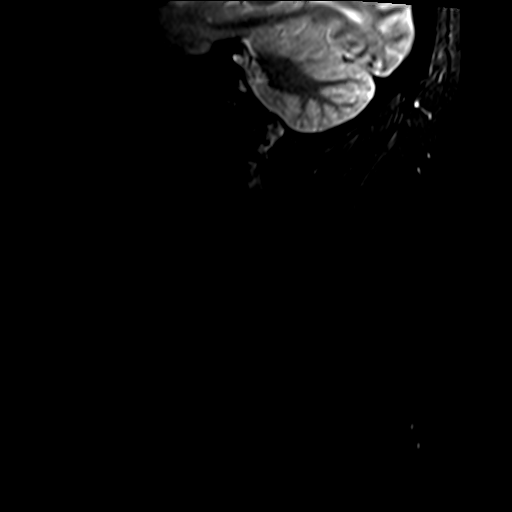

[Series 7: GRE · axial · 4.0mm · 0.45mm/px · z∈[-117,-103]mm · 2 of 18 slices shown]
[im 1/18]
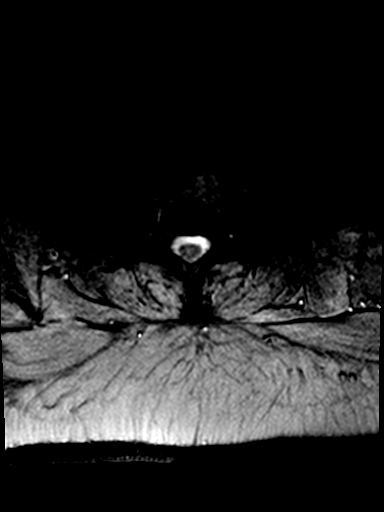
[im 4/18]
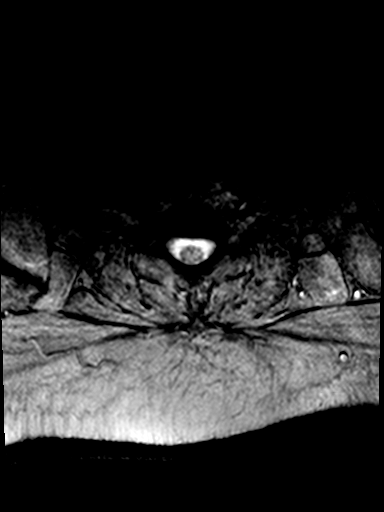

[Series 8: T2 · axial · 4.0mm · 0.43mm/px · z∈[-115,-36]mm · 6 of 18 slices shown (2 of 2)]
[im 1/18]
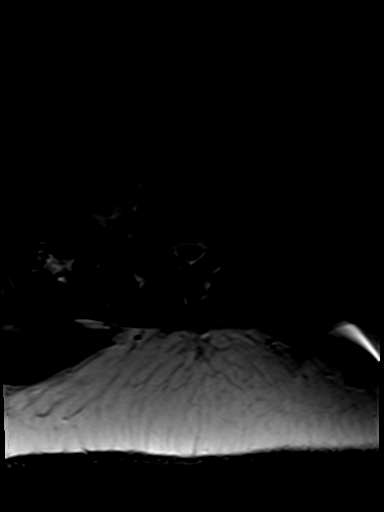
[im 4/18]
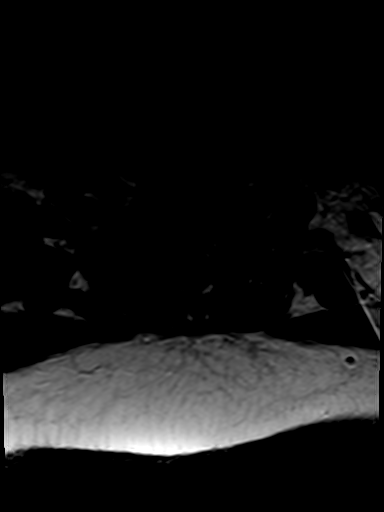
[im 7/18]
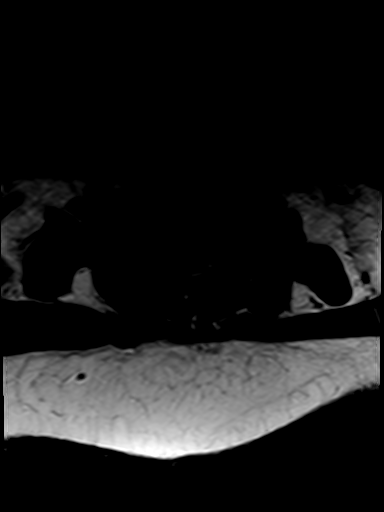
[im 11/18]
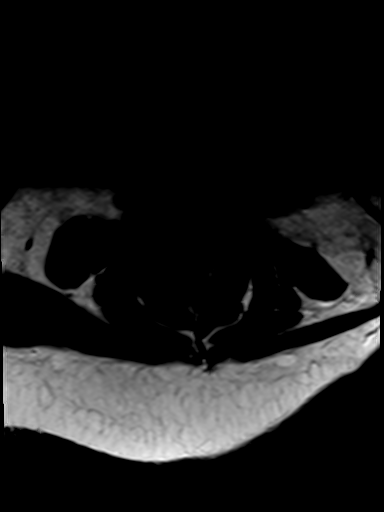
[im 14/18]
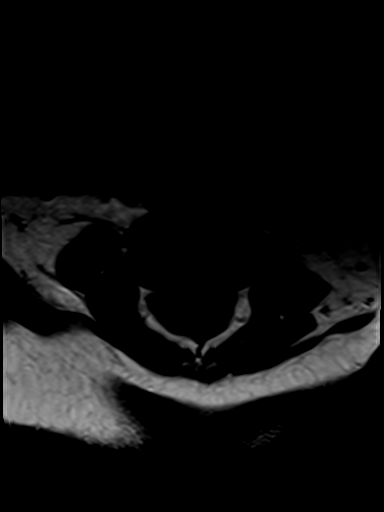
[im 18/18]
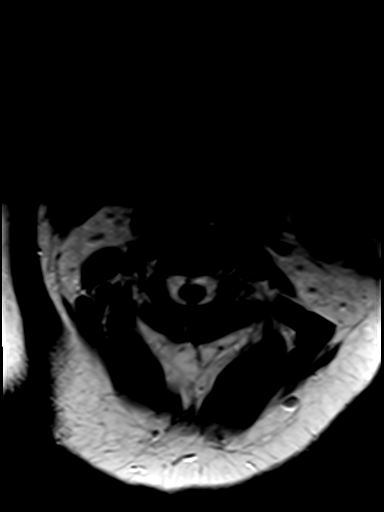

[21 of 48 positions shown; findings below may reference images not displayed]

FINDINGS: There is no abnormality at the foramen magnum, C1-2 or C2-3.  
 C3-4:  There is mild uncovertebral prominence, but no significant encroachment upon the canal or foramina.
 C4-5:  There is mild uncovertebral prominence, but no significant encroachment upon the canal or foramina.
 C5-6:  There is left sided uncovertebral hypertrophy with foraminal encroachment that could irritate the left C6 nerve root.
 C6-7:  There is no abnormality. 
 C7-T1:  There is no abnormality.
IMPRESSION: 1.  Left uncovertebral prominence at C5-6 with mild foraminal encroachment that could irritate the left C6 nerve root.
 2.  Lesser uncovertebral prominence bilaterally at C3-4 and C4-5, but no significant encroachment upon the neural spaces.

## 2008-08-10 ENCOUNTER — Emergency Department (HOSPITAL_COMMUNITY): Admission: EM | Admit: 2008-08-10 | Discharge: 2008-08-10 | Payer: Self-pay | Admitting: Family Medicine

## 2008-08-19 ENCOUNTER — Ambulatory Visit (HOSPITAL_COMMUNITY): Admission: RE | Admit: 2008-08-19 | Discharge: 2008-08-19 | Payer: Self-pay | Admitting: Cardiology

## 2008-09-30 ENCOUNTER — Encounter: Admission: RE | Admit: 2008-09-30 | Discharge: 2008-09-30 | Payer: Self-pay | Admitting: Cardiology

## 2008-10-08 ENCOUNTER — Encounter: Payer: Self-pay | Admitting: Gastroenterology

## 2008-10-15 ENCOUNTER — Ambulatory Visit (HOSPITAL_COMMUNITY): Admission: RE | Admit: 2008-10-15 | Discharge: 2008-10-15 | Payer: Self-pay | Admitting: Gastroenterology

## 2008-10-30 ENCOUNTER — Ambulatory Visit (HOSPITAL_COMMUNITY): Admission: RE | Admit: 2008-10-30 | Discharge: 2008-10-30 | Payer: Self-pay | Admitting: Gastroenterology

## 2008-11-14 IMAGING — CR DG ABDOMEN 2V
3 series · 3 of 3 positions shown · non-contrast
Comparison: Lumbar spine films of 07/08/2004

CLINICAL DATA: Right flank pain

ABDOMEN - 2 VIEW

[w abdomen upright *]
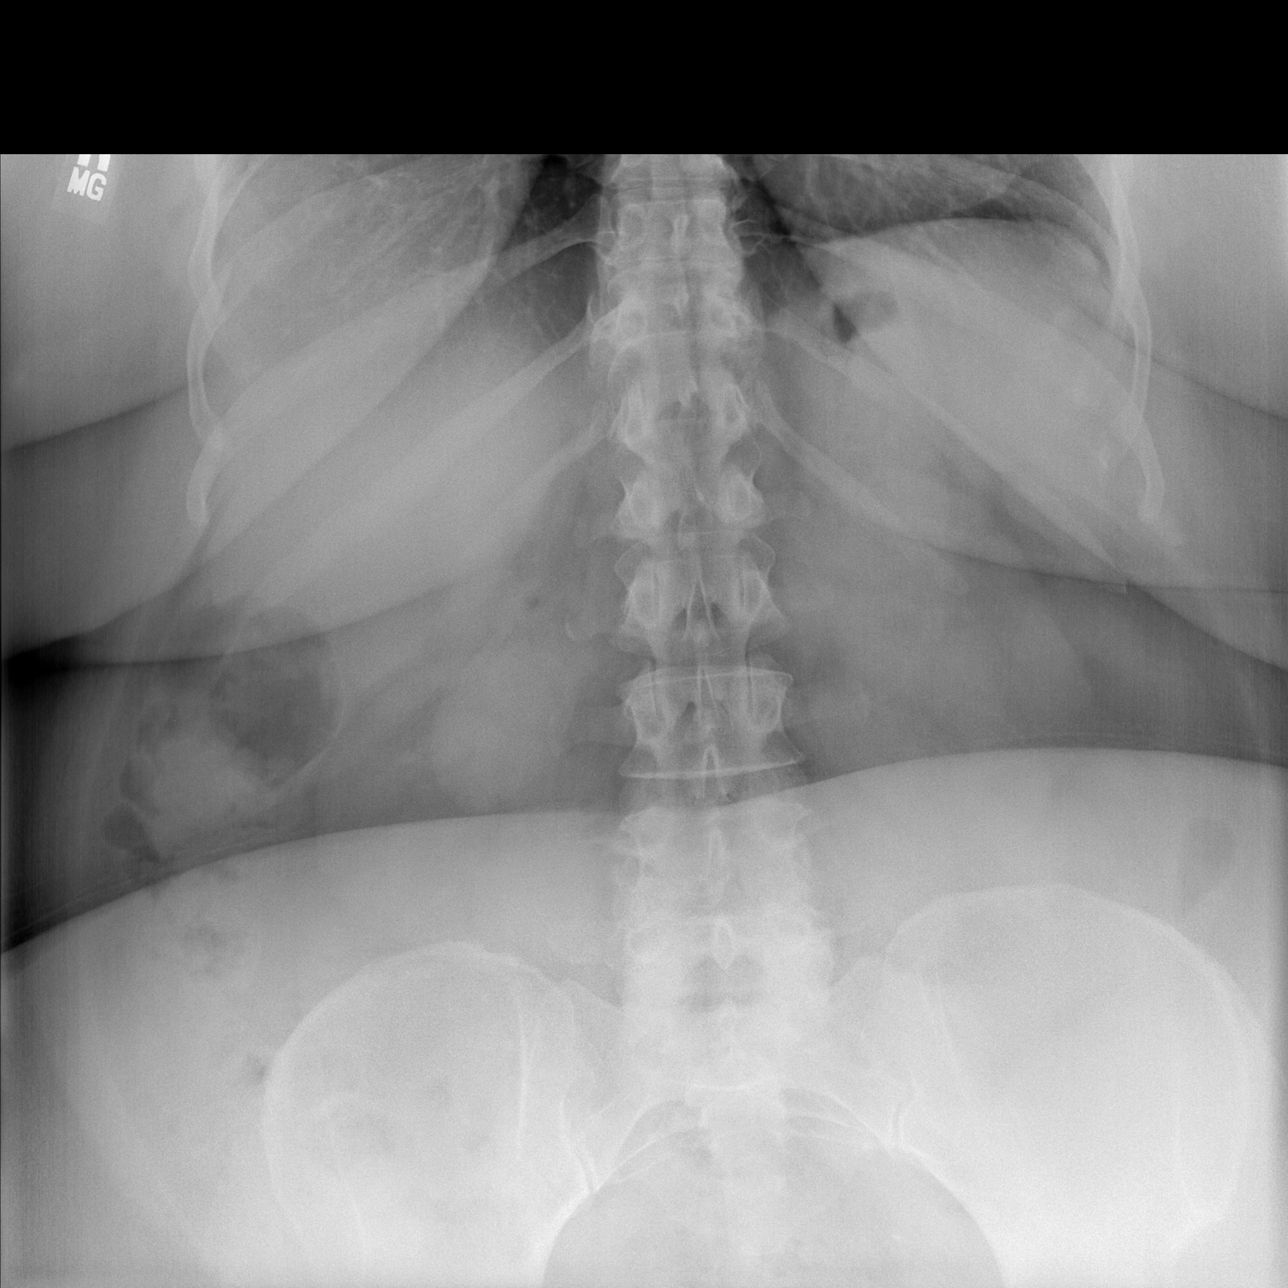

[t abdomen supine (1 of 2)]
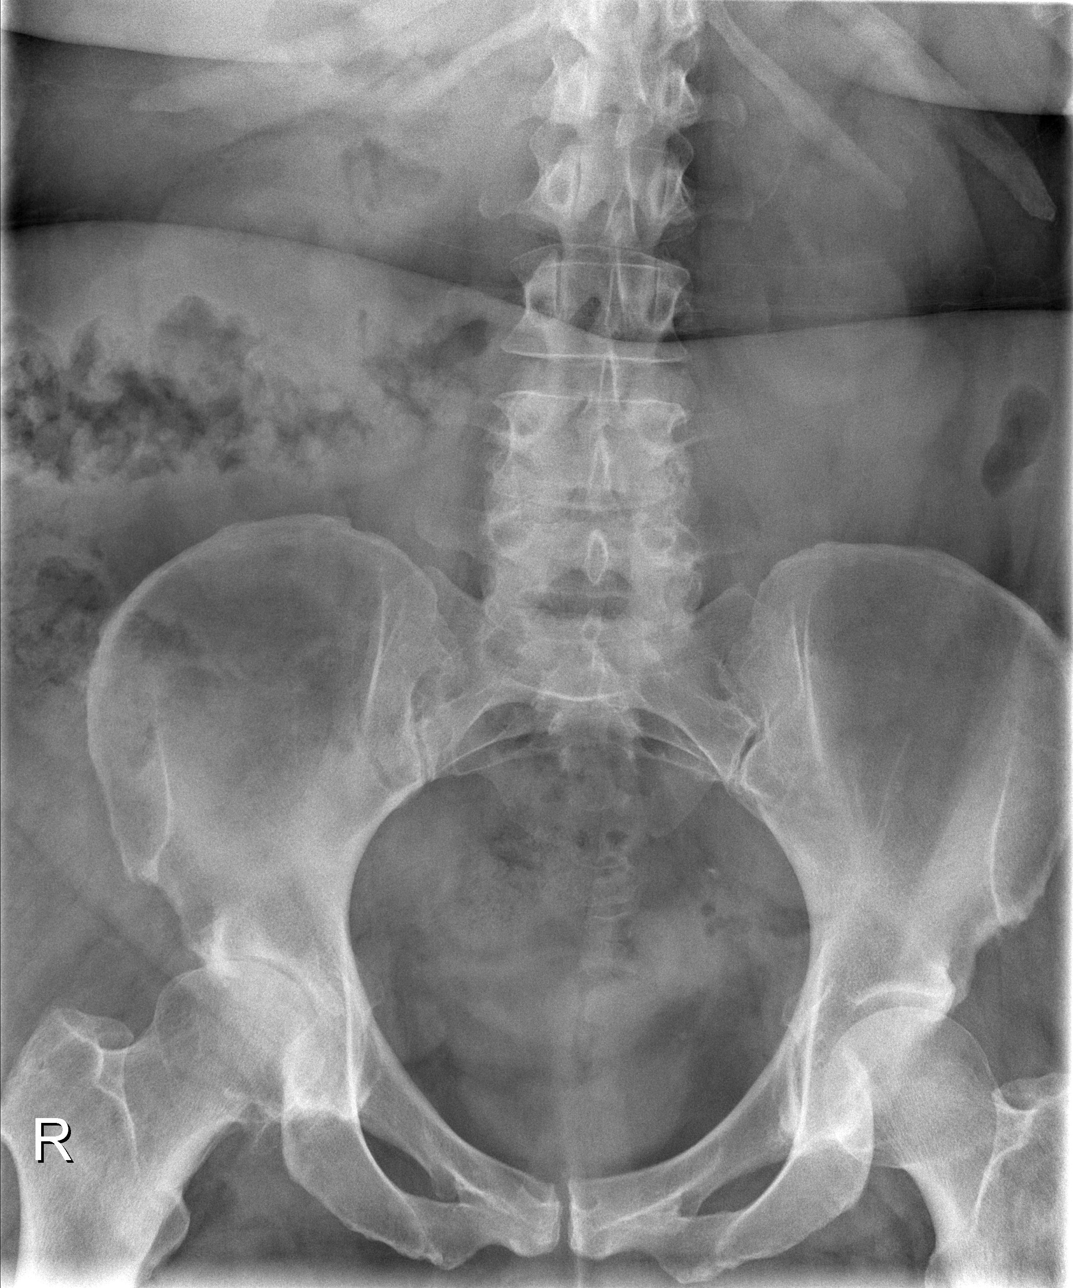

[t abdomen supine (2 of 2)]
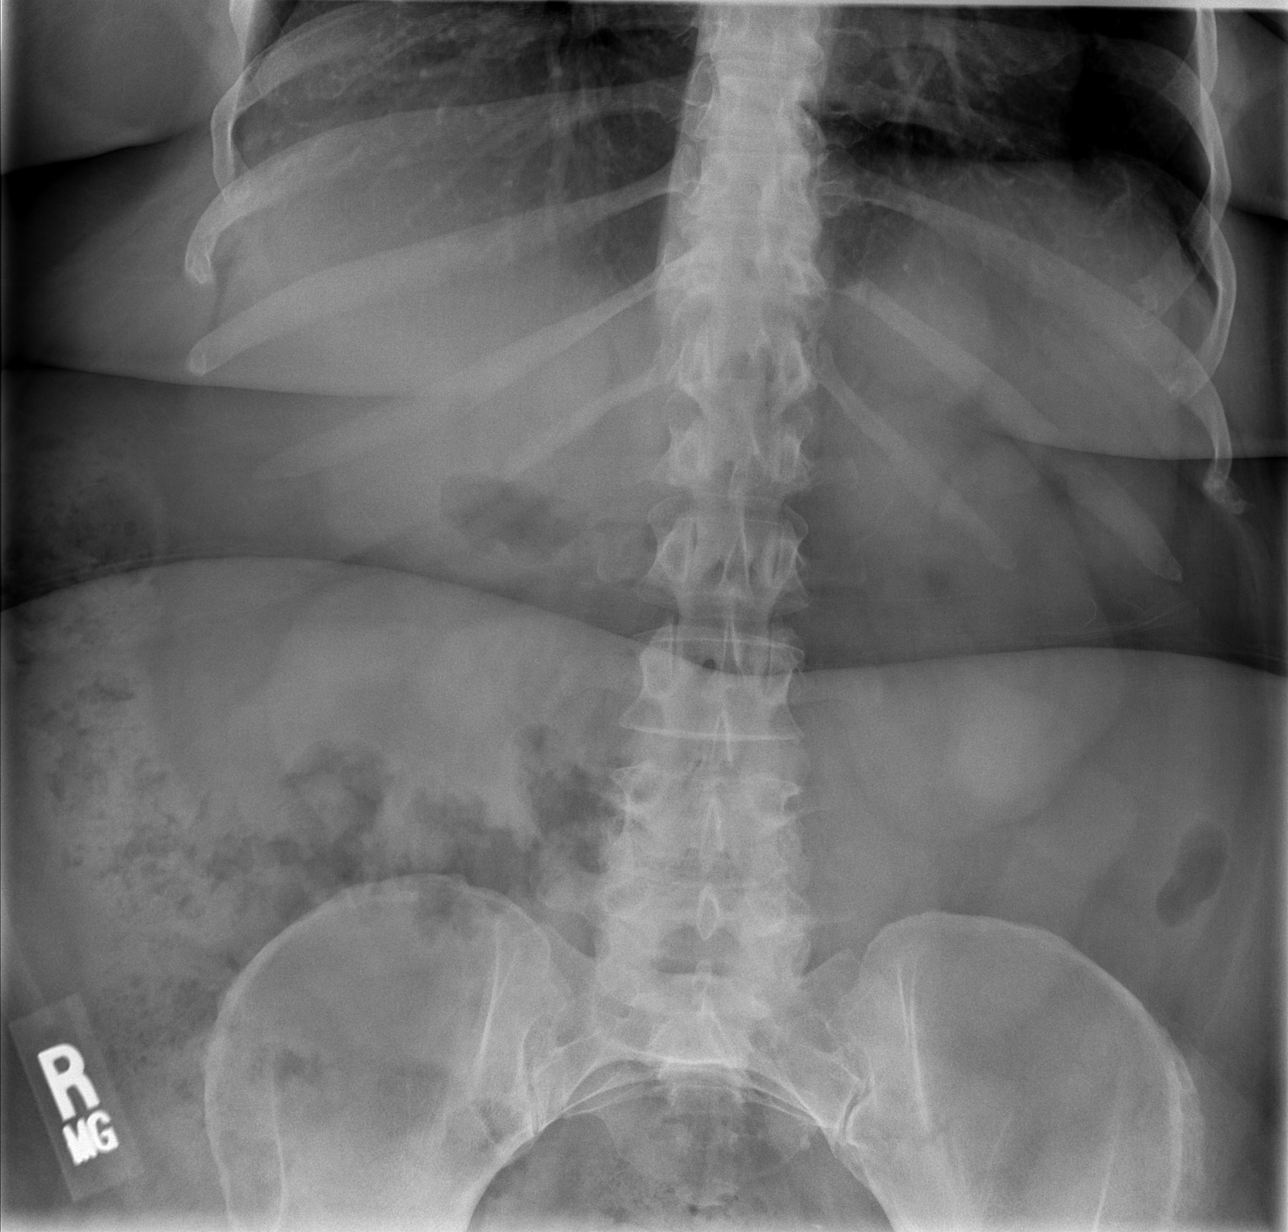

[3 of 3 positions shown; findings below may reference images not displayed]

FINDINGS: Lower thoracic spondylosis. Normal bowel gas pattern. No abnormal
calcifications over the kidneys or expected course of the ureters. Soft tissue
density projecting inferior to the right kidney is likely related to stool. No
abnormal pelvic calcifications.

Moderate degenerative change of the right hip possibly with a component of
chronic subluxation laterally.

IMPRESSION

1. No plain film evidence of urinary tract calculi. 
2. normal bowel gas pattern.
3. Degenerative changes of the right hip. Questionable component of chronic
developmental subluxation laterally. Correlate with right hip symptoms.

## 2008-11-20 ENCOUNTER — Encounter (INDEPENDENT_AMBULATORY_CARE_PROVIDER_SITE_OTHER): Payer: Self-pay | Admitting: Gastroenterology

## 2008-11-20 ENCOUNTER — Ambulatory Visit (HOSPITAL_COMMUNITY): Admission: RE | Admit: 2008-11-20 | Discharge: 2008-11-20 | Payer: Self-pay | Admitting: Gastroenterology

## 2008-11-20 ENCOUNTER — Encounter: Payer: Self-pay | Admitting: Gastroenterology

## 2008-11-24 ENCOUNTER — Encounter: Payer: Self-pay | Admitting: Gastroenterology

## 2008-11-26 ENCOUNTER — Encounter: Admission: RE | Admit: 2008-11-26 | Discharge: 2008-11-26 | Payer: Self-pay | Admitting: Gastroenterology

## 2008-11-26 ENCOUNTER — Encounter: Payer: Self-pay | Admitting: Gastroenterology

## 2008-11-26 ENCOUNTER — Encounter: Payer: Self-pay | Admitting: Internal Medicine

## 2008-12-01 ENCOUNTER — Ambulatory Visit: Payer: Self-pay | Admitting: Hematology & Oncology

## 2008-12-03 ENCOUNTER — Encounter: Payer: Self-pay | Admitting: Gastroenterology

## 2008-12-03 LAB — COMPREHENSIVE METABOLIC PANEL
AST: 24 U/L (ref 0–37)
Alkaline Phosphatase: 61 U/L (ref 39–117)
BUN: 14 mg/dL (ref 6–23)
Creatinine, Ser: 0.88 mg/dL (ref 0.40–1.20)

## 2008-12-03 LAB — CBC WITH DIFFERENTIAL (CANCER CENTER ONLY)
BASO%: 0.6 % (ref 0.0–2.0)
HCT: 39.1 % (ref 34.8–46.6)
LYMPH#: 2.7 10*3/uL (ref 0.9–3.3)
MONO#: 0.6 10*3/uL (ref 0.1–0.9)
NEUT#: 5 10*3/uL (ref 1.5–6.5)
NEUT%: 54.1 % (ref 39.6–80.0)
RDW: 15.2 % — ABNORMAL HIGH (ref 10.5–14.6)
WBC: 9.2 10*3/uL (ref 3.9–10.0)

## 2008-12-03 LAB — PREALBUMIN: Prealbumin: 26.3 mg/dL (ref 18.0–45.0)

## 2008-12-10 ENCOUNTER — Ambulatory Visit (HOSPITAL_COMMUNITY): Admission: RE | Admit: 2008-12-10 | Discharge: 2008-12-10 | Payer: Self-pay | Admitting: Hematology & Oncology

## 2008-12-10 ENCOUNTER — Telehealth: Payer: Self-pay | Admitting: Gastroenterology

## 2008-12-10 DIAGNOSIS — C169 Malignant neoplasm of stomach, unspecified: Secondary | ICD-10-CM | POA: Insufficient documentation

## 2008-12-11 ENCOUNTER — Ambulatory Visit: Payer: Self-pay | Admitting: Gastroenterology

## 2008-12-11 ENCOUNTER — Ambulatory Visit (HOSPITAL_COMMUNITY): Admission: RE | Admit: 2008-12-11 | Discharge: 2008-12-11 | Payer: Self-pay | Admitting: Gastroenterology

## 2008-12-15 IMAGING — CR DG CHEST 2V
2 series · 2 of 2 positions shown · non-contrast
Comparison: 08/03/07.

CLINICAL DATA: Chest pain and shortness of breath.  Asthma.
 CHEST ? 2 VIEW:

[w chest pa]
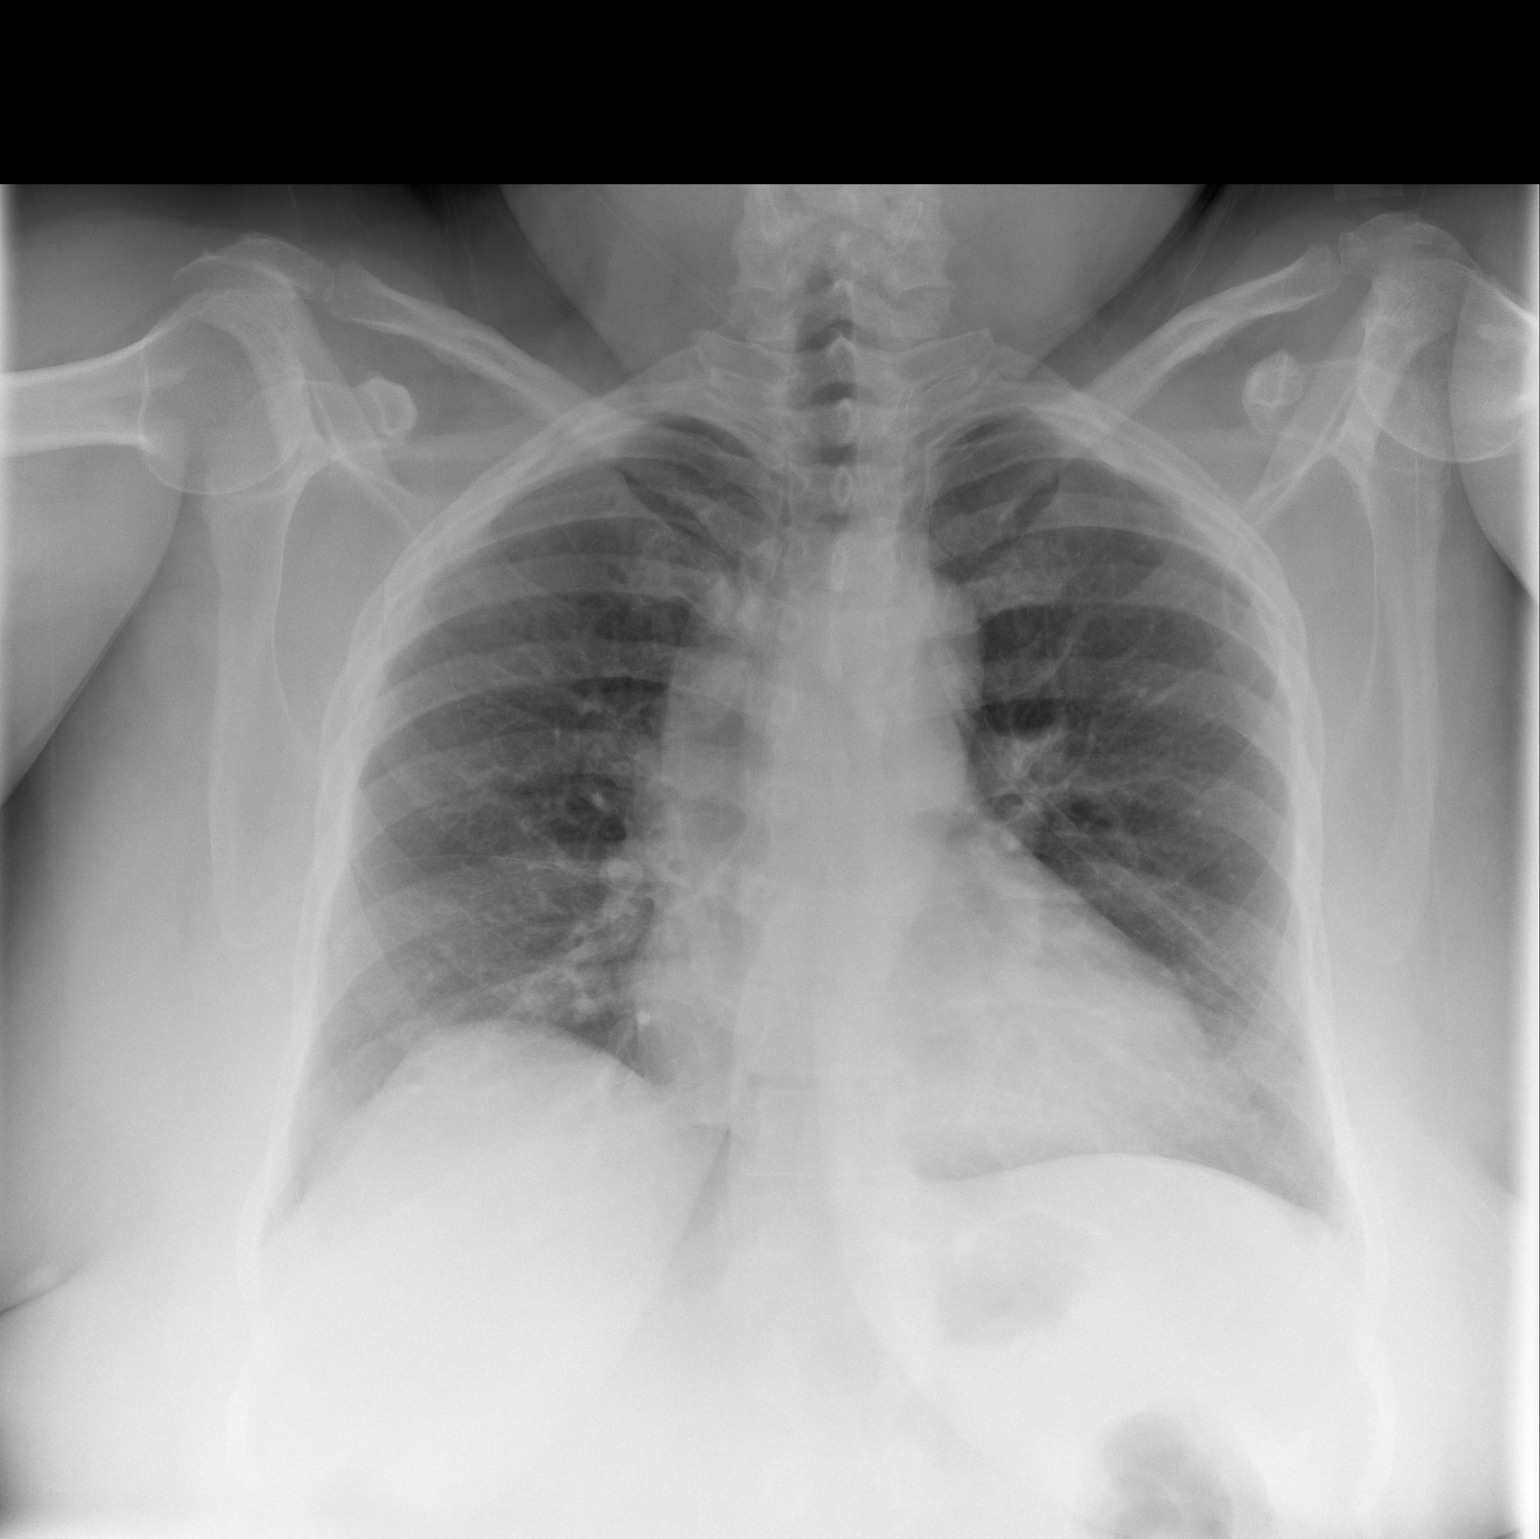

[w chest lat]
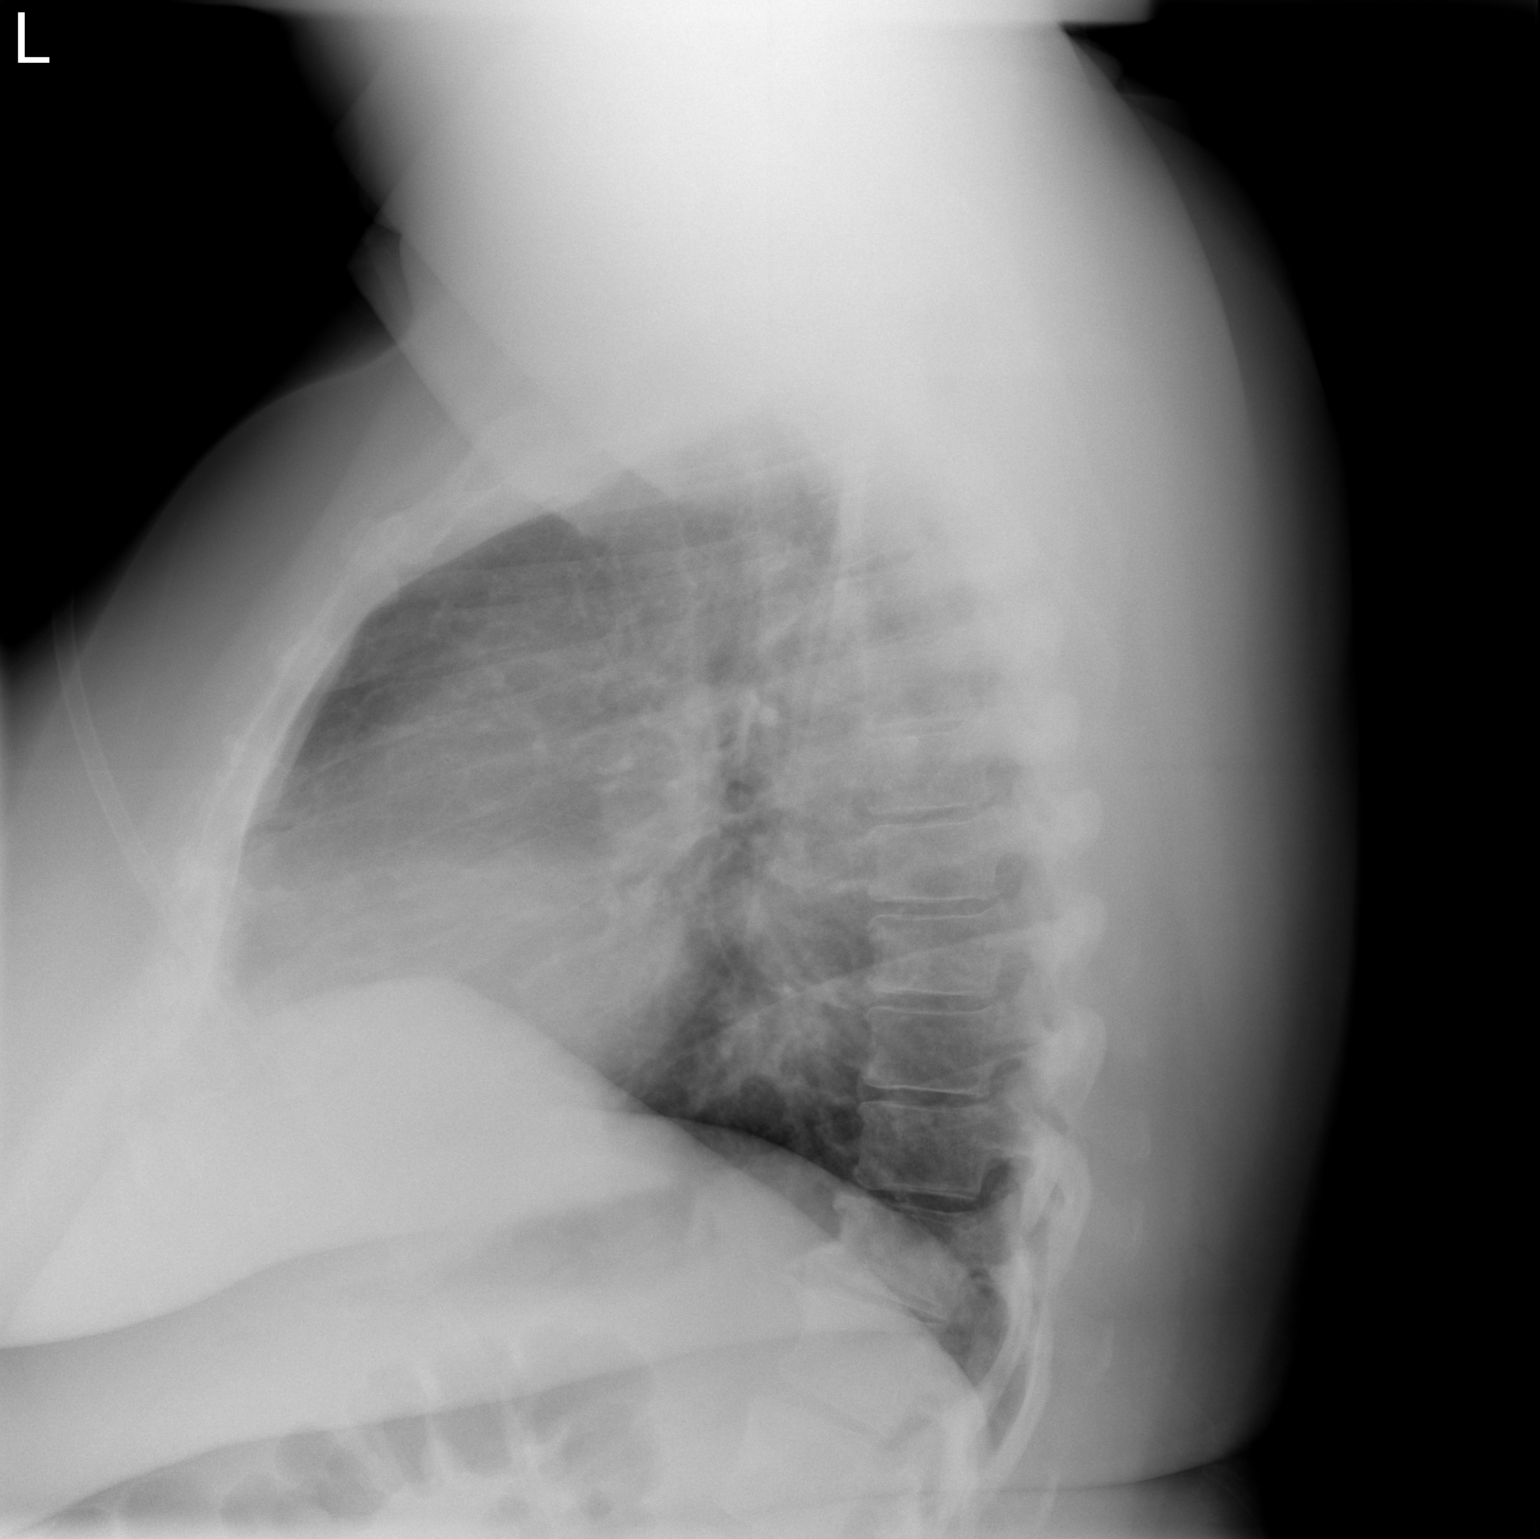

[2 of 2 positions shown; findings below may reference images not displayed]

FINDINGS: Borderline enlarged cardiac silhouette with a mild increase in size.  Stable tortuous aorta.  Clear lungs with normal vascularity.  Stable thoracic spine degenerative changes and minimal scoliosis.
IMPRESSION: Interval borderline cardiomegaly.  Otherwise, unremarkable examination.

## 2008-12-24 ENCOUNTER — Encounter: Payer: Self-pay | Admitting: Gastroenterology

## 2008-12-26 ENCOUNTER — Ambulatory Visit: Payer: Self-pay | Admitting: Internal Medicine

## 2009-01-16 ENCOUNTER — Ambulatory Visit (HOSPITAL_COMMUNITY): Admission: RE | Admit: 2009-01-16 | Discharge: 2009-01-16 | Payer: Self-pay | Admitting: Anesthesiology

## 2009-01-19 ENCOUNTER — Encounter (INDEPENDENT_AMBULATORY_CARE_PROVIDER_SITE_OTHER): Payer: Self-pay | Admitting: General Surgery

## 2009-01-19 ENCOUNTER — Inpatient Hospital Stay (HOSPITAL_COMMUNITY): Admission: RE | Admit: 2009-01-19 | Discharge: 2009-01-26 | Payer: Self-pay | Admitting: General Surgery

## 2009-01-19 ENCOUNTER — Ambulatory Visit: Payer: Self-pay | Admitting: Critical Care Medicine

## 2009-01-25 IMAGING — CT CT ANGIO CHEST
2 of 5 series · 19 of 36 positions shown · IV contrast (APPLIED)
Comparison: Chest radiograph of 10/14/07.

CLINICAL DATA: Short of breath.
CT ANGIOGRAPHY OF CHEST:
TECHNIQUE: Multidetector CT imaging of the chest was performed during bolus injection of intravenous contrast.  Multiplanar CT angiographic image reconstructions were generated to evaluate the vascular anatomy.
Contrast:  100 mL Omnipaque 350.

[Series 8: pulm embolism 1.0 b25f thins · axial · 0.62mm/px · z∈[+72,+298]mm · 16 of 254 slices shown]
[im 14/254  lung]
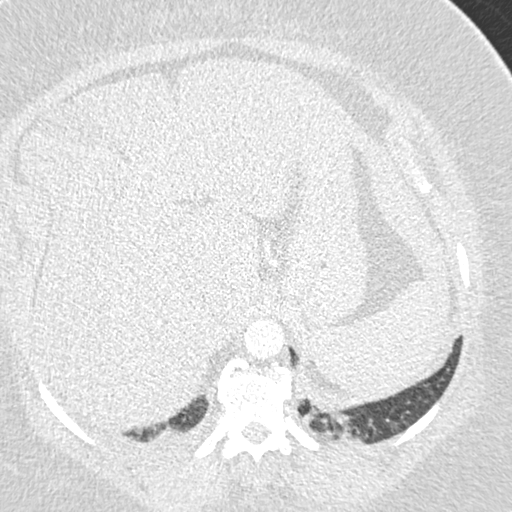
[im 27/254  mediastinal]
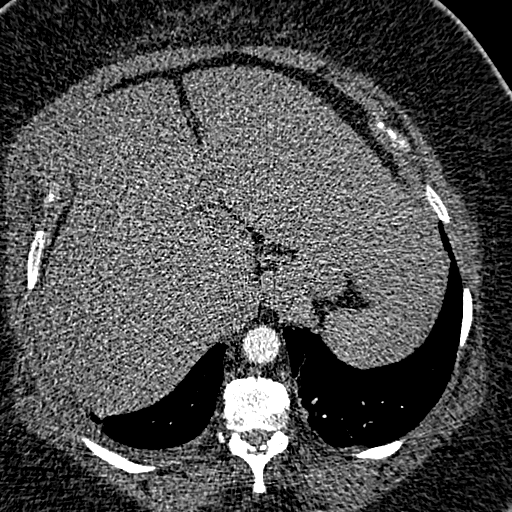
[im 40/254  lung]
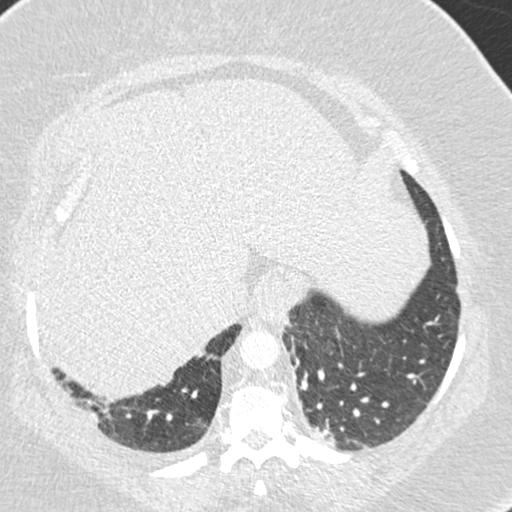
[im 54/254  mediastinal]
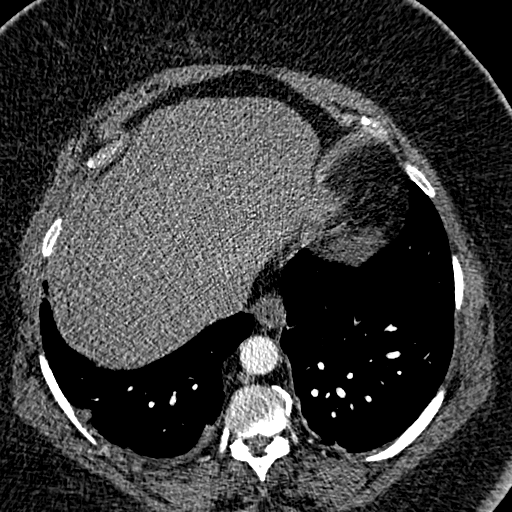
[im 80/254  lung]
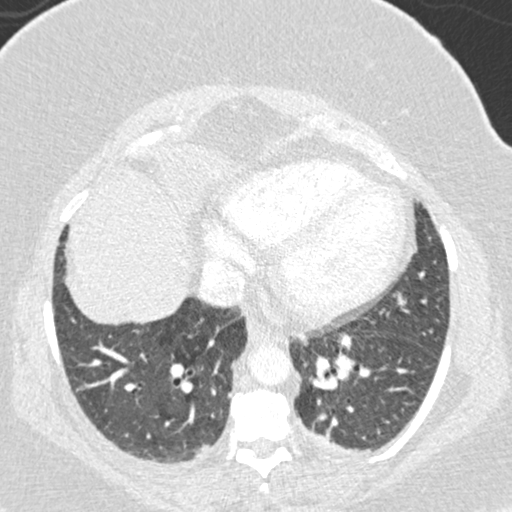
[im 94/254  mediastinal]
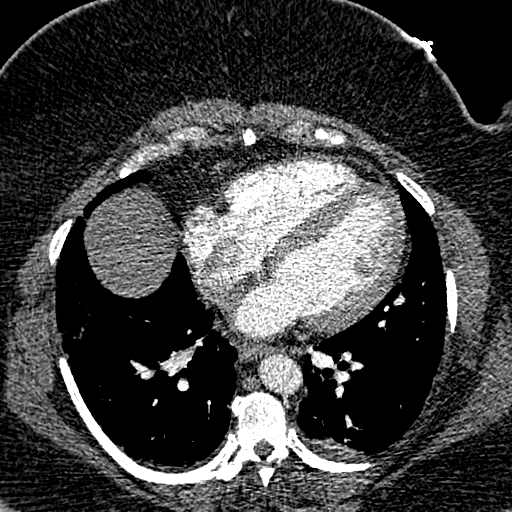
[im 107/254  lung]
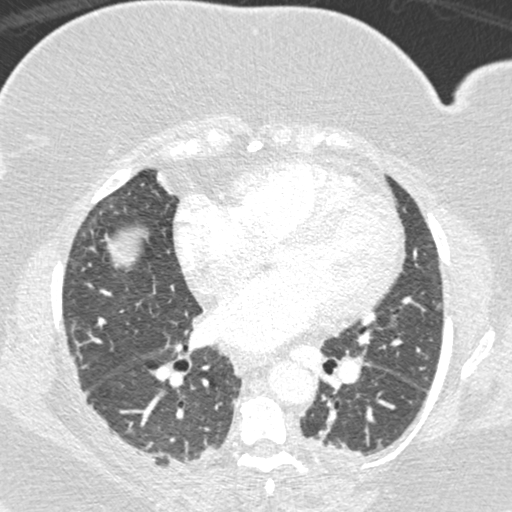
[im 120/254  mediastinal]
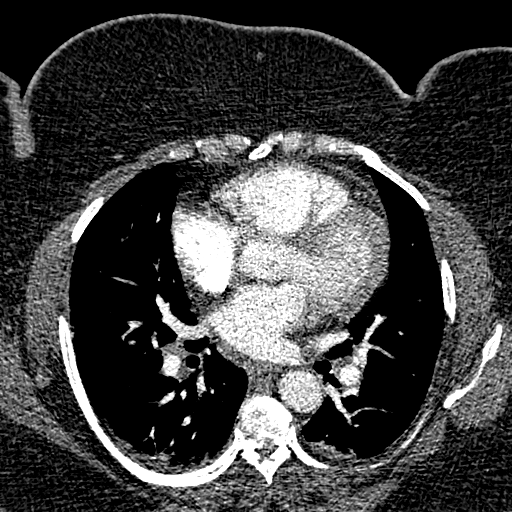
[im 134/254  lung]
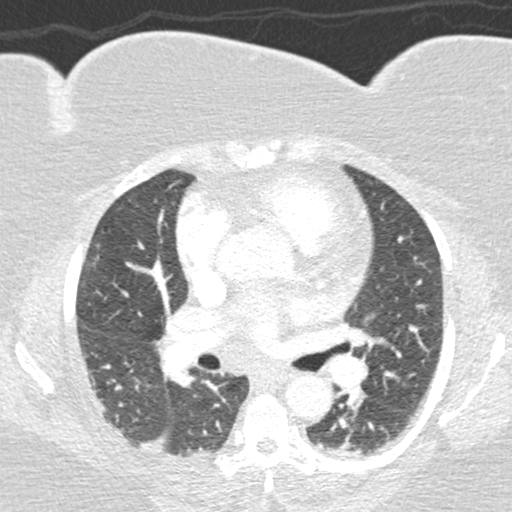
[im 147/254  mediastinal]
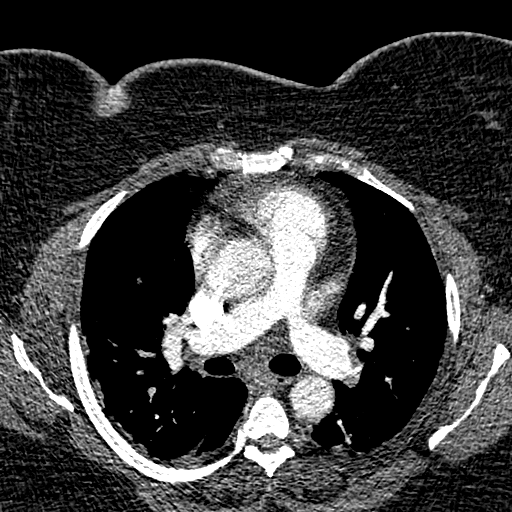
[im 160/254  lung]
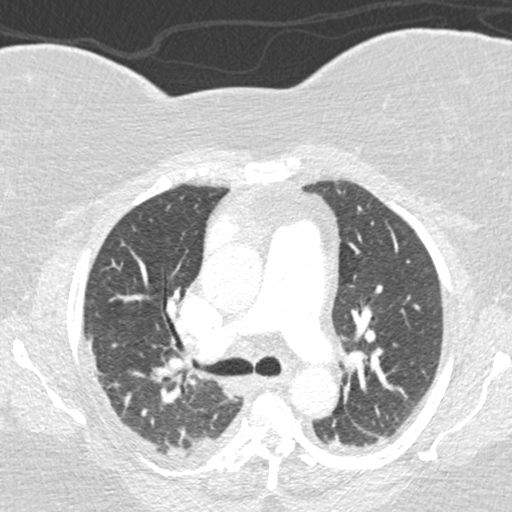
[im 174/254  mediastinal]
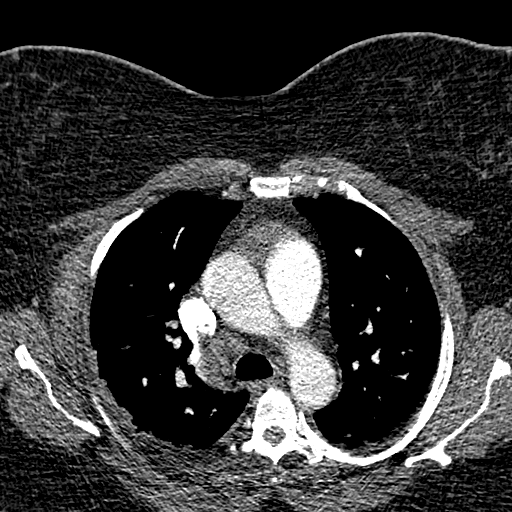
[im 200/254  lung]
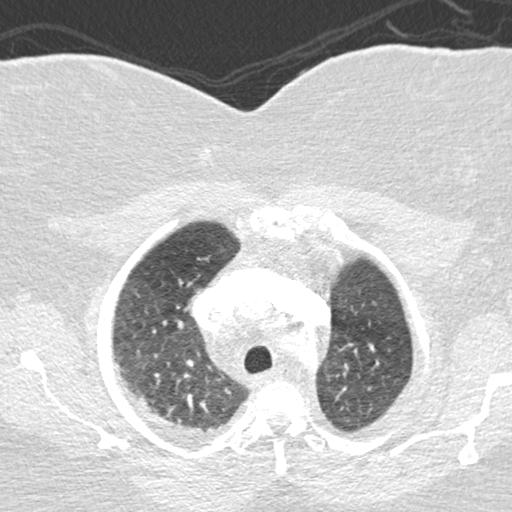
[im 214/254  mediastinal]
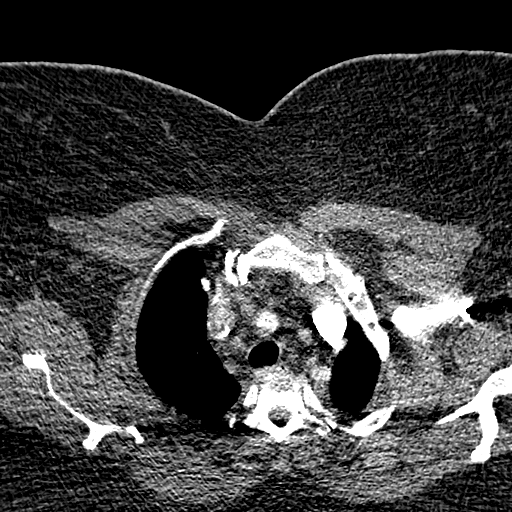
[im 227/254  lung]
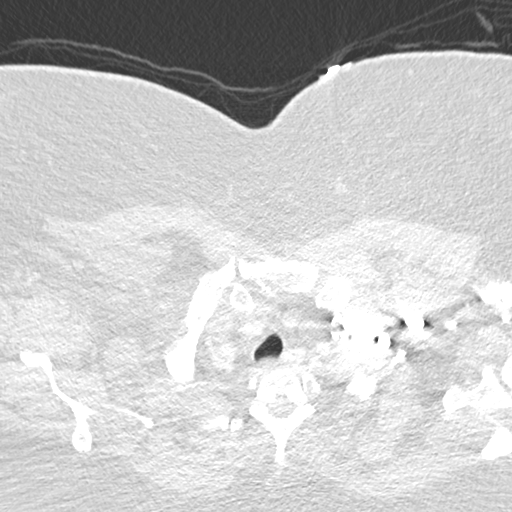
[im 240/254  mediastinal]
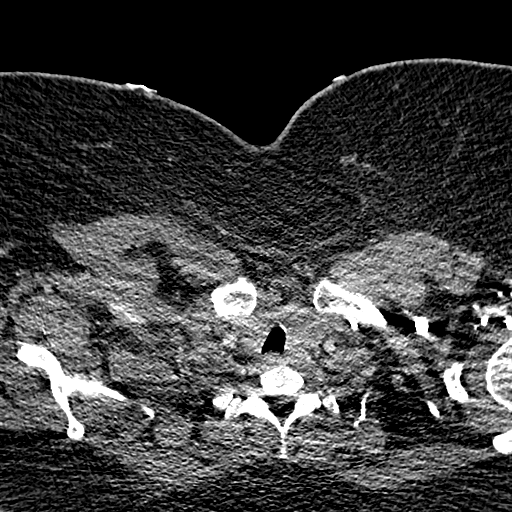

[Series 602: cor pe · coronal · 0.62mm/px · 3 of 102 slices shown]
[im 21/102  mediastinal]
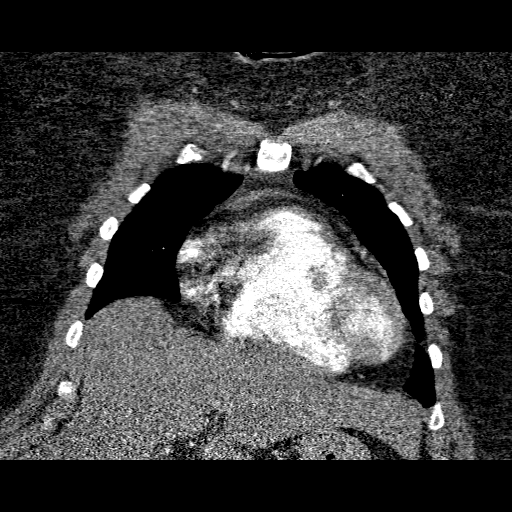
[im 41/102  mediastinal]
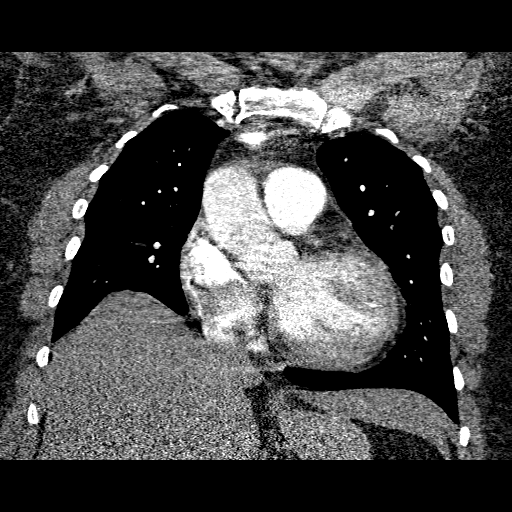
[im 61/102  mediastinal]
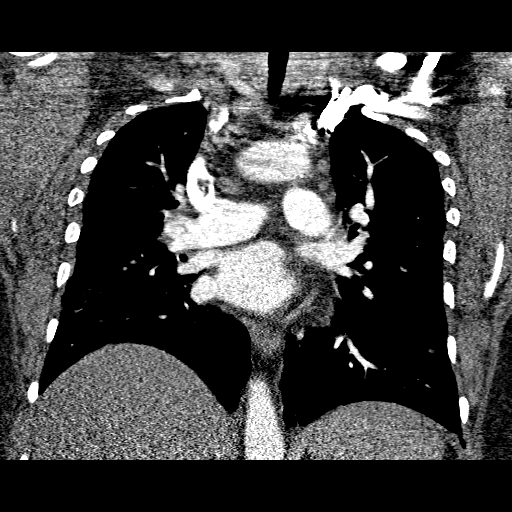

[19 of 36 positions shown; findings below may reference images not displayed]

FINDINGS: The study is technically adequate to evaluate for pulmonary embolus, and no PE is present.  The aorta appears within normal limits.  Cardiomegaly is present, mild.  Incidental imaging of the upper abdomen is negative. 
The lung windows demonstrate marked dependent atelectasis, which is nearly symmetric bilaterally.  Nodular areas of atelectasis are present in the dependent portions of the lungs.  No airspace disease is identified.  Bones appear within normal limits.
IMPRESSION: 1.  Technically adequate study without pulmonary embolus. 
2.  Mild cardiomegaly. 
3.  Significant dependent atelectasis.

## 2009-01-25 IMAGING — CR DG CHEST 2V
2 series · 2 of 2 positions shown · non-contrast
Comparison: Chest 09/03/07.

CLINICAL DATA: Short of breath. Heart palpitations. Cough.
 CHEST - 2 VIEW:

[w chest pa *]
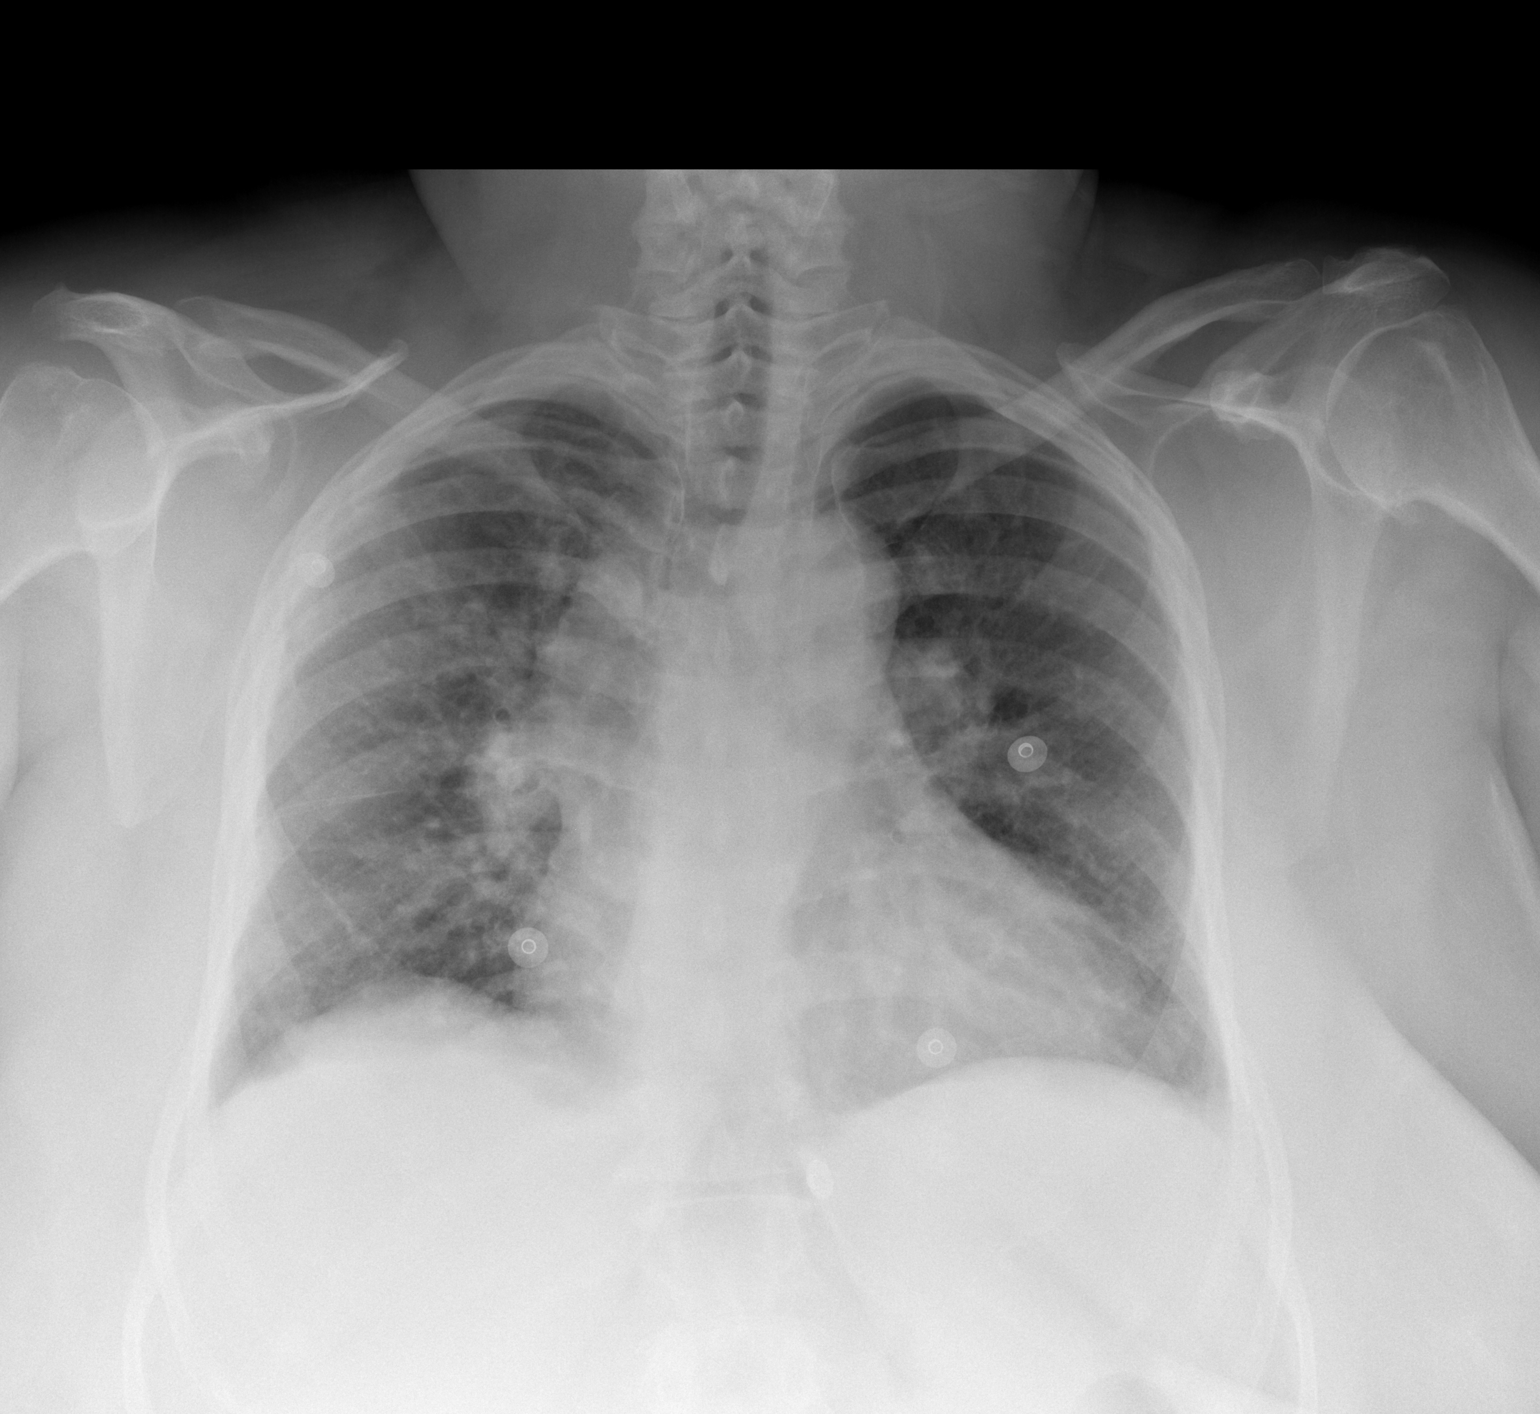

[w chest lat *]
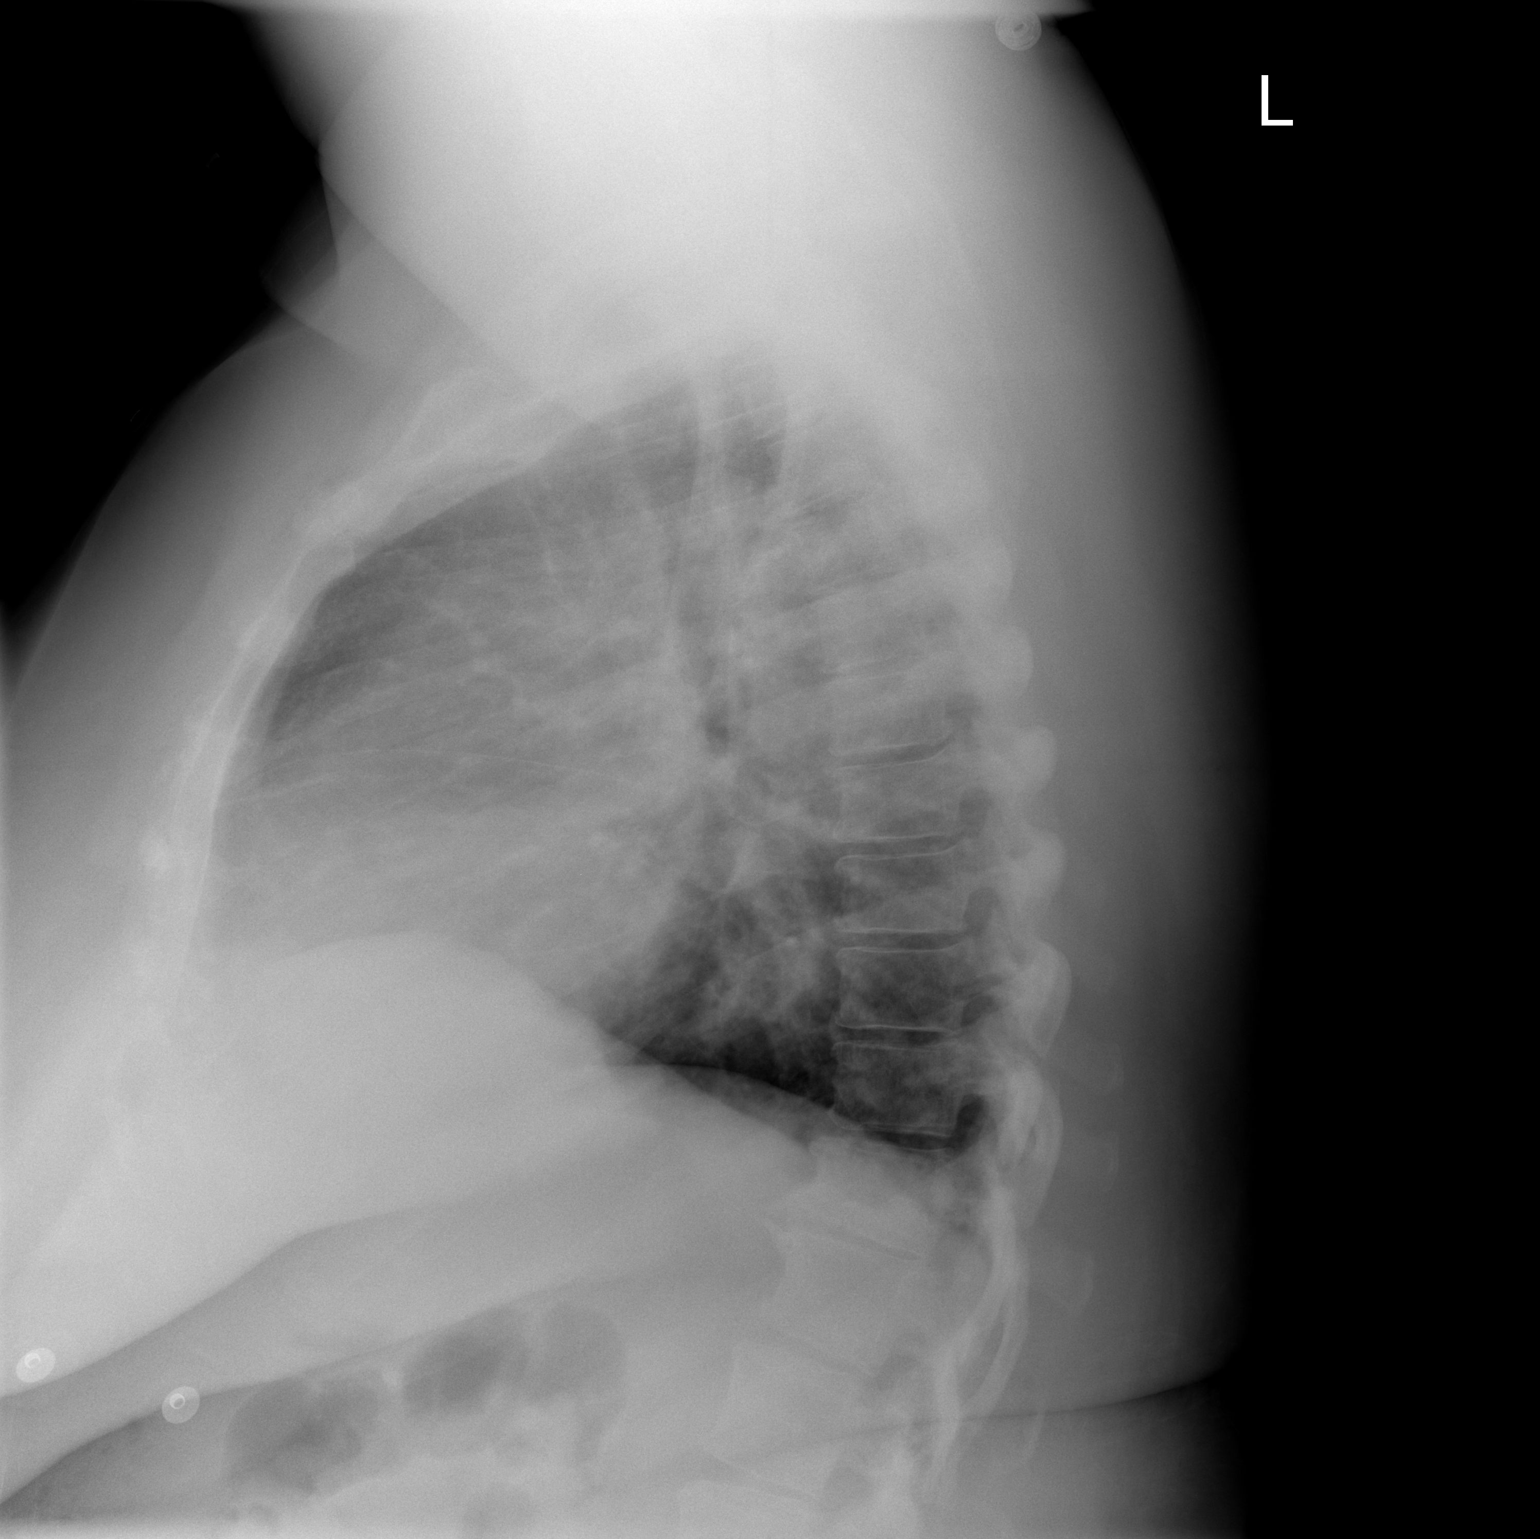

[2 of 2 positions shown; findings below may reference images not displayed]

FINDINGS: Two views of the chest show indistinctive pulmonary vascularity suggesting mild congestion. No focal infiltrate or effusion is seen. Borderline cardiomegaly is stable. No bony abnormality is noted.
IMPRESSION: Borderline cardiomegaly with questionable mild congestion. No definite infiltrate.

## 2009-02-11 ENCOUNTER — Ambulatory Visit: Payer: Self-pay | Admitting: Hematology & Oncology

## 2009-02-13 ENCOUNTER — Encounter: Payer: Self-pay | Admitting: Gastroenterology

## 2009-02-19 ENCOUNTER — Ambulatory Visit (HOSPITAL_COMMUNITY): Admission: RE | Admit: 2009-02-19 | Discharge: 2009-02-19 | Payer: Self-pay | Admitting: General Surgery

## 2009-02-23 LAB — CBC WITH DIFFERENTIAL (CANCER CENTER ONLY)
BASO#: 0.1 10*3/uL (ref 0.0–0.2)
Eosinophils Absolute: 0.5 10*3/uL (ref 0.0–0.5)
HGB: 12.8 g/dL (ref 11.6–15.9)
LYMPH%: 27.6 % (ref 14.0–48.0)
MCH: 26.8 pg (ref 26.0–34.0)
MCV: 83 fL (ref 81–101)
MONO%: 9.4 % (ref 0.0–13.0)
RBC: 4.75 10*6/uL (ref 3.70–5.32)

## 2009-02-23 LAB — COMPREHENSIVE METABOLIC PANEL
Albumin: 3.4 g/dL — ABNORMAL LOW (ref 3.5–5.2)
CO2: 23 mEq/L (ref 19–32)
Glucose, Bld: 150 mg/dL — ABNORMAL HIGH (ref 70–99)
Potassium: 3.4 mEq/L — ABNORMAL LOW (ref 3.5–5.3)
Sodium: 145 mEq/L (ref 135–145)
Total Bilirubin: 0.5 mg/dL (ref 0.3–1.2)
Total Protein: 5.9 g/dL — ABNORMAL LOW (ref 6.0–8.3)

## 2009-03-12 ENCOUNTER — Ambulatory Visit: Payer: Self-pay | Admitting: Hematology & Oncology

## 2009-03-13 LAB — COMPREHENSIVE METABOLIC PANEL
AST: 24 U/L (ref 0–37)
Albumin: 3.6 g/dL (ref 3.5–5.2)
Alkaline Phosphatase: 57 U/L (ref 39–117)
Potassium: 4.2 mEq/L (ref 3.5–5.3)
Sodium: 144 mEq/L (ref 135–145)
Total Protein: 6.5 g/dL (ref 6.0–8.3)

## 2009-03-13 LAB — CBC WITH DIFFERENTIAL (CANCER CENTER ONLY)
BASO%: 0.8 % (ref 0.0–2.0)
EOS%: 9.5 % — ABNORMAL HIGH (ref 0.0–7.0)
LYMPH#: 2.3 10*3/uL (ref 0.9–3.3)
LYMPH%: 33.4 % (ref 14.0–48.0)
MCV: 83 fL (ref 81–101)
MONO#: 0.5 10*3/uL (ref 0.1–0.9)
Platelets: 341 10*3/uL (ref 145–400)
RDW: 14.7 % — ABNORMAL HIGH (ref 10.5–14.6)
WBC: 7 10*3/uL (ref 3.9–10.0)

## 2009-03-17 ENCOUNTER — Ambulatory Visit: Payer: Self-pay | Admitting: Internal Medicine

## 2009-03-19 ENCOUNTER — Ambulatory Visit: Admission: RE | Admit: 2009-03-19 | Discharge: 2009-06-02 | Payer: Self-pay | Admitting: Radiation Oncology

## 2009-04-03 LAB — CBC WITH DIFFERENTIAL (CANCER CENTER ONLY)
BASO%: 0.5 % (ref 0.0–2.0)
LYMPH%: 34.6 % (ref 14.0–48.0)
MCH: 27.4 pg (ref 26.0–34.0)
MCV: 84 fL (ref 81–101)
MONO%: 8.2 % (ref 0.0–13.0)
Platelets: 334 10*3/uL (ref 145–400)
RDW: 14.8 % — ABNORMAL HIGH (ref 10.5–14.6)

## 2009-04-03 LAB — COMPREHENSIVE METABOLIC PANEL
ALT: 18 U/L (ref 0–35)
AST: 21 U/L (ref 0–37)
Albumin: 3.7 g/dL (ref 3.5–5.2)
Calcium: 9.4 mg/dL (ref 8.4–10.5)
Chloride: 108 mEq/L (ref 96–112)
Potassium: 4.5 mEq/L (ref 3.5–5.3)
Sodium: 147 mEq/L — ABNORMAL HIGH (ref 135–145)

## 2009-04-10 ENCOUNTER — Ambulatory Visit: Payer: Self-pay | Admitting: Hematology & Oncology

## 2009-04-13 ENCOUNTER — Encounter: Payer: Self-pay | Admitting: Internal Medicine

## 2009-04-24 LAB — CBC WITH DIFFERENTIAL (CANCER CENTER ONLY)
BASO#: 0.1 10*3/uL (ref 0.0–0.2)
Eosinophils Absolute: 0.5 10*3/uL (ref 0.0–0.5)
HCT: 36.2 % (ref 34.8–46.6)
HGB: 11.8 g/dL (ref 11.6–15.9)
LYMPH%: 33.9 % (ref 14.0–48.0)
MCH: 27.1 pg (ref 26.0–34.0)
MCV: 83 fL (ref 81–101)
MONO#: 0.7 10*3/uL (ref 0.1–0.9)
MONO%: 9.5 % (ref 0.0–13.0)
NEUT%: 49.1 % (ref 39.6–80.0)
Platelets: 483 10*3/uL — ABNORMAL HIGH (ref 145–400)
RBC: 4.36 10*6/uL (ref 3.70–5.32)

## 2009-04-24 LAB — COMPREHENSIVE METABOLIC PANEL
ALT: 18 U/L (ref 0–35)
Albumin: 3.7 g/dL (ref 3.5–5.2)
Alkaline Phosphatase: 61 U/L (ref 39–117)
CO2: 23 mEq/L (ref 19–32)
Potassium: 4.2 mEq/L (ref 3.5–5.3)
Sodium: 143 mEq/L (ref 135–145)
Total Bilirubin: 0.4 mg/dL (ref 0.3–1.2)
Total Protein: 6.5 g/dL (ref 6.0–8.3)

## 2009-04-30 ENCOUNTER — Telehealth: Payer: Self-pay | Admitting: Internal Medicine

## 2009-05-05 ENCOUNTER — Telehealth (INDEPENDENT_AMBULATORY_CARE_PROVIDER_SITE_OTHER): Payer: Self-pay | Admitting: *Deleted

## 2009-05-14 ENCOUNTER — Ambulatory Visit: Payer: Self-pay | Admitting: Hematology & Oncology

## 2009-05-14 LAB — CBC WITH DIFFERENTIAL (CANCER CENTER ONLY)
BASO%: 0.6 % (ref 0.0–2.0)
EOS%: 20.8 % — ABNORMAL HIGH (ref 0.0–7.0)
HCT: 38.2 % (ref 34.8–46.6)
LYMPH#: 0.4 10*3/uL — ABNORMAL LOW (ref 0.9–3.3)
LYMPH%: 6.4 % — ABNORMAL LOW (ref 14.0–48.0)
MCHC: 32.6 g/dL (ref 32.0–36.0)
MONO#: 0.6 10*3/uL (ref 0.1–0.9)
NEUT%: 61.6 % (ref 39.6–80.0)
Platelets: 239 10*3/uL (ref 145–400)
RDW: 15.4 % — ABNORMAL HIGH (ref 10.5–14.6)
WBC: 5.6 10*3/uL (ref 3.9–10.0)

## 2009-05-14 LAB — CMP (CANCER CENTER ONLY)
Alkaline Phosphatase: 59 U/L (ref 26–84)
BUN, Bld: 6 mg/dL — ABNORMAL LOW (ref 7–22)
Glucose, Bld: 292 mg/dL — ABNORMAL HIGH (ref 73–118)
Sodium: 142 mEq/L (ref 128–145)
Total Bilirubin: 0.5 mg/dl (ref 0.20–1.60)

## 2009-05-19 ENCOUNTER — Ambulatory Visit: Payer: Self-pay | Admitting: Hematology

## 2009-05-21 LAB — CBC WITH DIFFERENTIAL (CANCER CENTER ONLY)
BASO#: 0 10*3/uL (ref 0.0–0.2)
Eosinophils Absolute: 0.6 10*3/uL — ABNORMAL HIGH (ref 0.0–0.5)
HCT: 35.8 % (ref 34.8–46.6)
HGB: 11.9 g/dL (ref 11.6–15.9)
LYMPH#: 0.3 10*3/uL — ABNORMAL LOW (ref 0.9–3.3)
MCH: 26.9 pg (ref 26.0–34.0)
MONO%: 10.5 % (ref 0.0–13.0)
NEUT#: 4 10*3/uL (ref 1.5–6.5)
RBC: 4.44 10*6/uL (ref 3.70–5.32)

## 2009-05-21 LAB — COMPREHENSIVE METABOLIC PANEL
Albumin: 3.9 g/dL (ref 3.5–5.2)
BUN: 7 mg/dL (ref 6–23)
Calcium: 9.2 mg/dL (ref 8.4–10.5)
Chloride: 105 mEq/L (ref 96–112)
Glucose, Bld: 203 mg/dL — ABNORMAL HIGH (ref 70–99)
Potassium: 3.5 mEq/L (ref 3.5–5.3)

## 2009-06-01 ENCOUNTER — Inpatient Hospital Stay (HOSPITAL_COMMUNITY): Admission: EM | Admit: 2009-06-01 | Discharge: 2009-06-07 | Payer: Self-pay | Admitting: Hematology & Oncology

## 2009-06-01 LAB — CBC WITH DIFFERENTIAL (CANCER CENTER ONLY)
BASO#: 0 10*3/uL (ref 0.0–0.2)
Eosinophils Absolute: 0.7 10*3/uL — ABNORMAL HIGH (ref 0.0–0.5)
HGB: 13 g/dL (ref 11.6–15.9)
MCH: 27.1 pg (ref 26.0–34.0)
MONO#: 0.4 10*3/uL (ref 0.1–0.9)
MONO%: 12.3 % (ref 0.0–13.0)
NEUT#: 1.8 10*3/uL (ref 1.5–6.5)
RBC: 4.79 10*6/uL (ref 3.70–5.32)
WBC: 3.3 10*3/uL — ABNORMAL LOW (ref 3.9–10.0)

## 2009-06-01 LAB — CMP (CANCER CENTER ONLY)
AST: 54 U/L — ABNORMAL HIGH (ref 11–38)
Alkaline Phosphatase: 63 U/L (ref 26–84)
Glucose, Bld: 198 mg/dL — ABNORMAL HIGH (ref 73–118)
Sodium: 136 mEq/L (ref 128–145)
Total Bilirubin: 0.7 mg/dl (ref 0.20–1.60)
Total Protein: 6.6 g/dL (ref 6.4–8.1)

## 2009-06-03 ENCOUNTER — Ambulatory Visit: Payer: Self-pay | Admitting: Hematology & Oncology

## 2009-06-06 ENCOUNTER — Ambulatory Visit: Payer: Self-pay | Admitting: Oncology

## 2009-06-12 LAB — CBC WITH DIFFERENTIAL (CANCER CENTER ONLY)
BASO%: 0.6 % (ref 0.0–2.0)
HCT: 36.5 % (ref 34.8–46.6)
LYMPH%: 36.2 % (ref 14.0–48.0)
MCH: 26.8 pg (ref 26.0–34.0)
MCHC: 33.4 g/dL (ref 32.0–36.0)
MCV: 80 fL — ABNORMAL LOW (ref 81–101)
MONO%: 11.5 % (ref 0.0–13.0)
NEUT%: 38.8 % — ABNORMAL LOW (ref 39.6–80.0)
Platelets: 353 10*3/uL (ref 145–400)
RDW: 16.4 % — ABNORMAL HIGH (ref 10.5–14.6)

## 2009-06-12 LAB — COMPREHENSIVE METABOLIC PANEL
ALT: 15 U/L (ref 0–35)
BUN: 9 mg/dL (ref 6–23)
CO2: 26 mEq/L (ref 19–32)
Calcium: 9.3 mg/dL (ref 8.4–10.5)
Chloride: 106 mEq/L (ref 96–112)
Creatinine, Ser: 0.62 mg/dL (ref 0.40–1.20)
Glucose, Bld: 126 mg/dL — ABNORMAL HIGH (ref 70–99)

## 2009-06-23 ENCOUNTER — Ambulatory Visit: Payer: Self-pay | Admitting: Hematology & Oncology

## 2009-06-24 ENCOUNTER — Ambulatory Visit (HOSPITAL_BASED_OUTPATIENT_CLINIC_OR_DEPARTMENT_OTHER): Admission: RE | Admit: 2009-06-24 | Discharge: 2009-06-24 | Payer: Self-pay | Admitting: Hematology & Oncology

## 2009-06-24 ENCOUNTER — Ambulatory Visit: Payer: Self-pay | Admitting: Hematology & Oncology

## 2009-06-24 ENCOUNTER — Ambulatory Visit: Payer: Self-pay | Admitting: Diagnostic Radiology

## 2009-06-24 LAB — CBC WITH DIFFERENTIAL (CANCER CENTER ONLY)
BASO#: 0 10*3/uL (ref 0.0–0.2)
EOS%: 5.1 % (ref 0.0–7.0)
Eosinophils Absolute: 0.3 10*3/uL (ref 0.0–0.5)
HGB: 12 g/dL (ref 11.6–15.9)
LYMPH#: 2 10*3/uL (ref 0.9–3.3)
MCHC: 33.9 g/dL (ref 32.0–36.0)
NEUT#: 2.8 10*3/uL (ref 1.5–6.5)
RBC: 4.37 10*6/uL (ref 3.70–5.32)

## 2009-06-24 LAB — COMPREHENSIVE METABOLIC PANEL
ALT: 21 U/L (ref 0–35)
Albumin: 3.7 g/dL (ref 3.5–5.2)
CO2: 28 mEq/L (ref 19–32)
Calcium: 9.2 mg/dL (ref 8.4–10.5)
Chloride: 105 mEq/L (ref 96–112)
Glucose, Bld: 114 mg/dL — ABNORMAL HIGH (ref 70–99)
Potassium: 3.4 mEq/L — ABNORMAL LOW (ref 3.5–5.3)
Sodium: 144 mEq/L (ref 135–145)
Total Bilirubin: 0.5 mg/dL (ref 0.3–1.2)
Total Protein: 6.4 g/dL (ref 6.0–8.3)

## 2009-06-24 LAB — LACTATE DEHYDROGENASE: LDH: 173 U/L (ref 94–250)

## 2009-06-25 ENCOUNTER — Emergency Department (HOSPITAL_COMMUNITY): Admission: EM | Admit: 2009-06-25 | Discharge: 2009-06-25 | Payer: Self-pay | Admitting: Emergency Medicine

## 2009-07-13 ENCOUNTER — Ambulatory Visit (HOSPITAL_BASED_OUTPATIENT_CLINIC_OR_DEPARTMENT_OTHER): Admission: RE | Admit: 2009-07-13 | Discharge: 2009-07-13 | Payer: Self-pay | Admitting: Hematology & Oncology

## 2009-07-13 ENCOUNTER — Ambulatory Visit: Payer: Self-pay | Admitting: Diagnostic Radiology

## 2009-07-31 ENCOUNTER — Ambulatory Visit: Payer: Self-pay | Admitting: Hematology & Oncology

## 2009-08-01 IMAGING — CR DG CHEST 1V PORT
1 series · 1 of 1 positions shown · non-contrast
Comparison: 10/14/2007

CLINICAL DATA: Chest pain

PORTABLE CHEST - 1 VIEW

[AP]
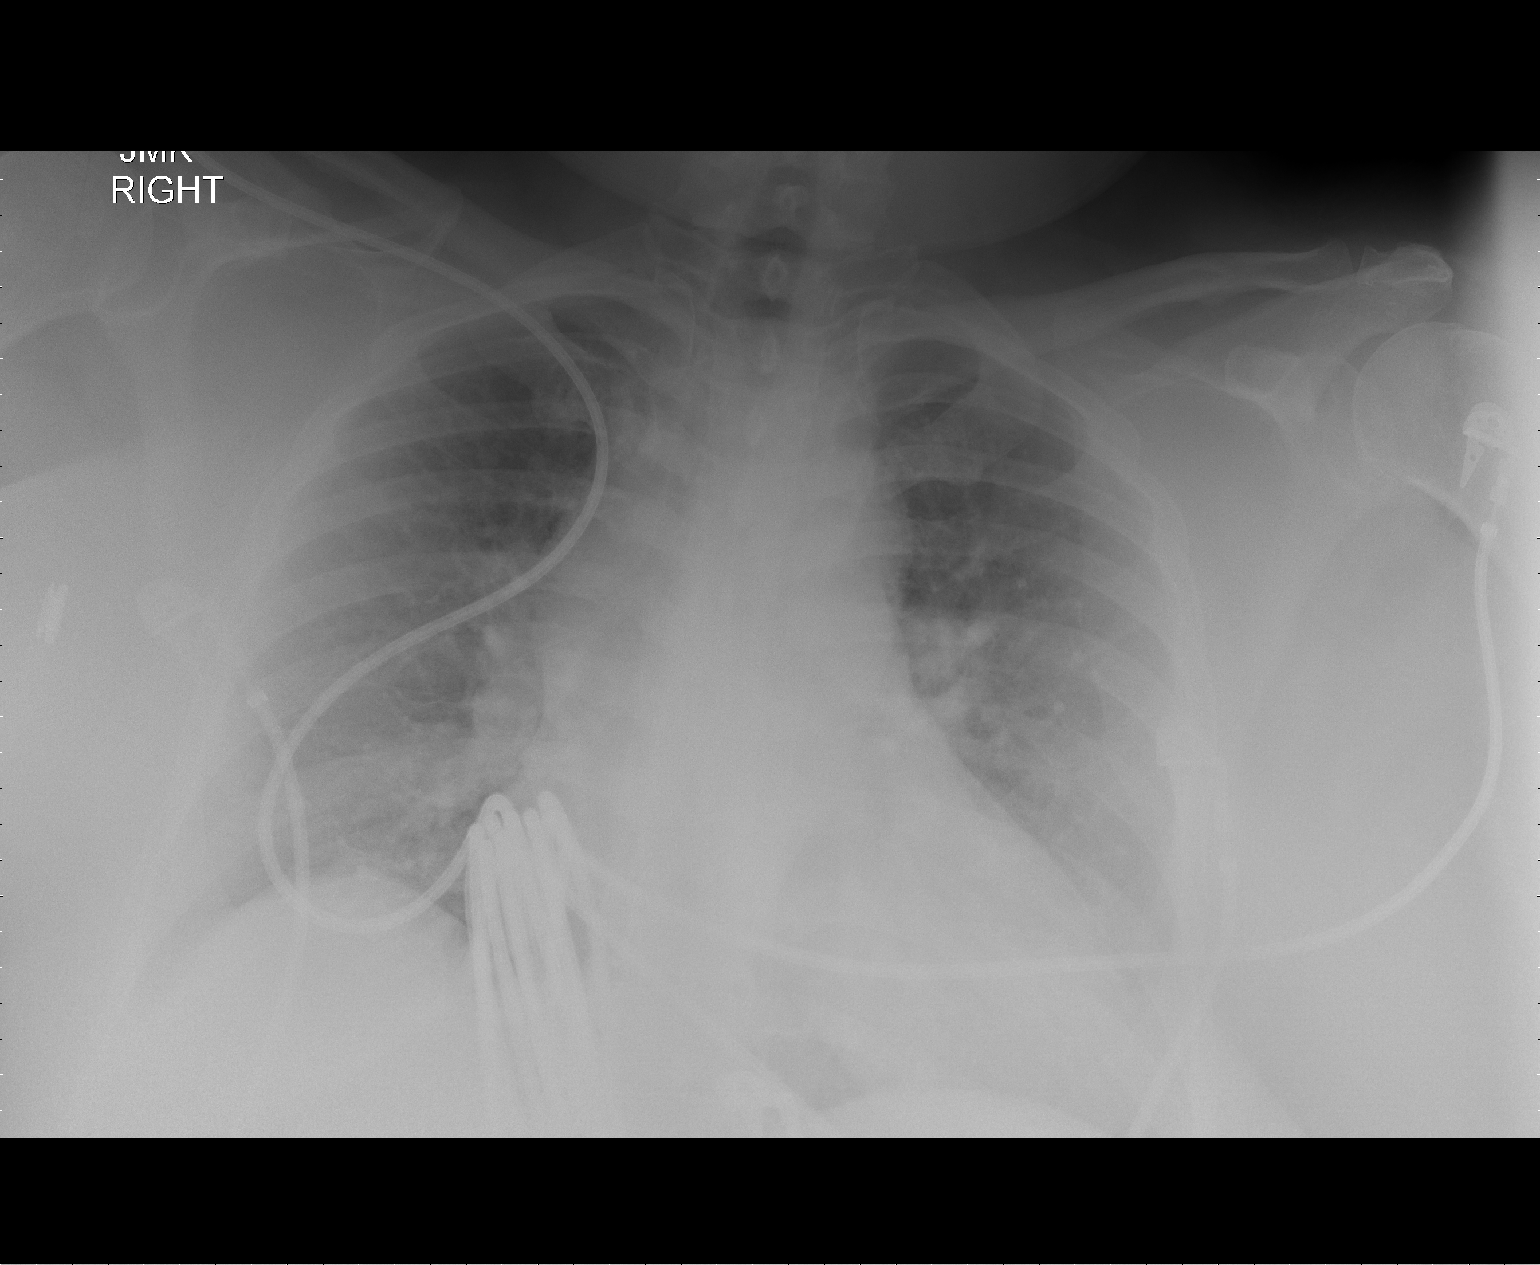

[1 of 1 positions shown; findings below may reference images not displayed]

FINDINGS: Cardiomegaly and pulmonary vascular congestion.  No
pulmonary edema.  No pleural effusions.
IMPRESSION: Cardiomegaly and pulmonary vascular congestion.

## 2009-08-01 IMAGING — CT CT ANGIO CHEST
1 of 3 series · 19 of 32 positions shown · IV contrast (APPLIED)
Comparison: CT chest 10/14/2007 and chest radiograph 04/19/2008

CLINICAL DATA: Shortness of breath and history of asthma

CT ANGIOGRAPHY CHEST
TECHNIQUE: Multidetector CT imaging of the chest using the
standard protocol during bolus administration of intravenous
contrast. Multiplanar reconstructed images obtained and reviewed to
evaluate the vascular anatomy.
Contrast: 100 ml Omnipaque 350.

[Series 8: pulm embolism 1.0 b25f thins · axial · 0.64mm/px · z∈[-352,-98]mm · 19 of 285 slices shown]
[im 15/285  lung]
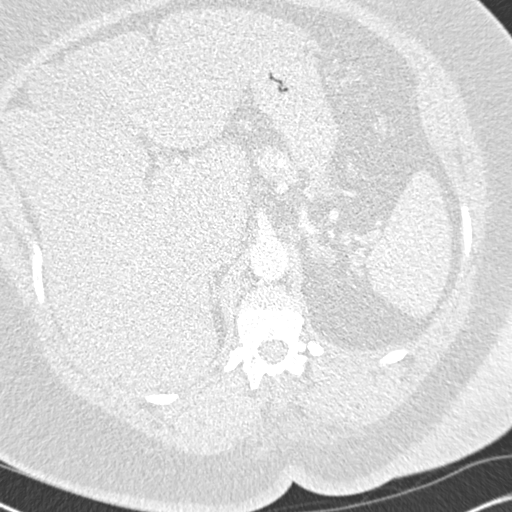
[im 29/285  mediastinal]
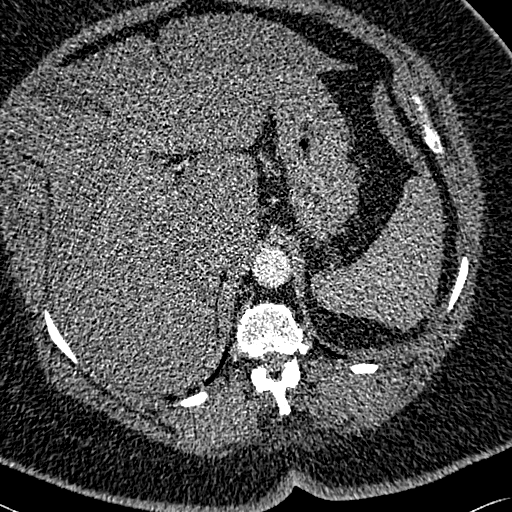
[im 43/285  lung]
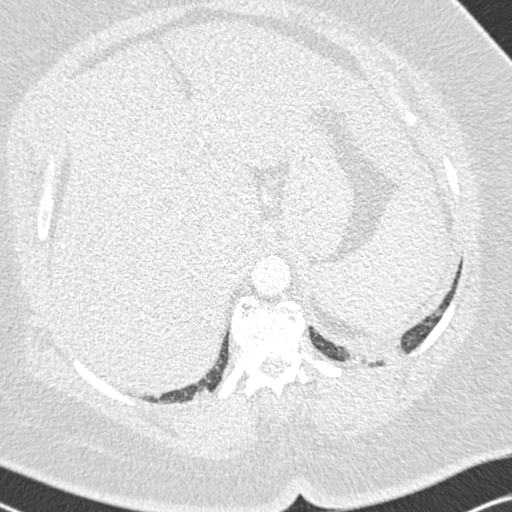
[im 72/285  mediastinal]
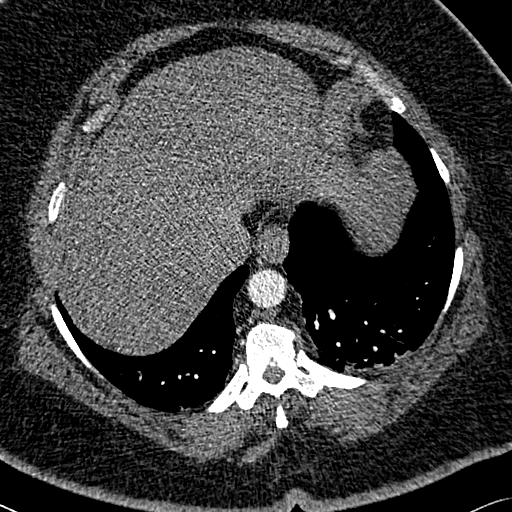
[im 86/285  lung]
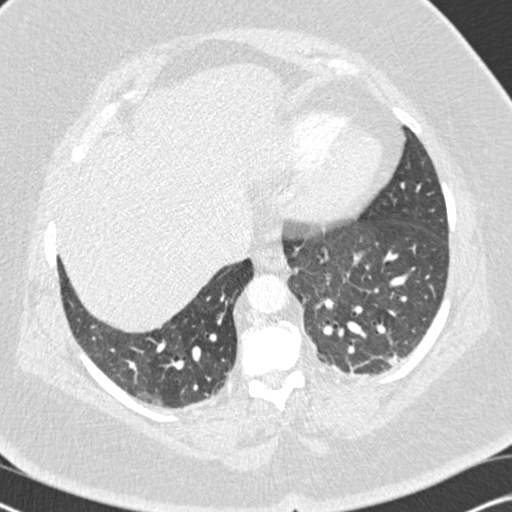
[im 95/285  mediastinal]
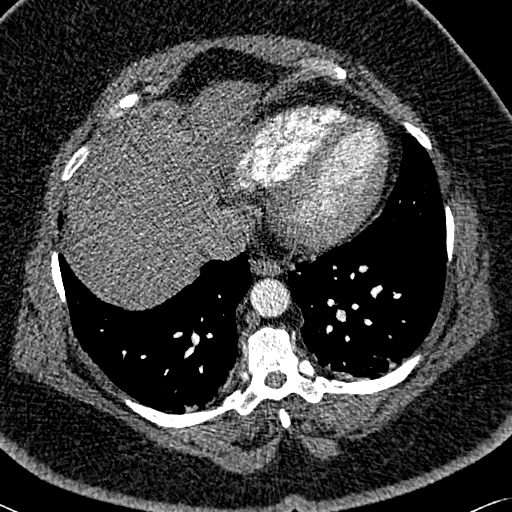
[im 100/285  lung]
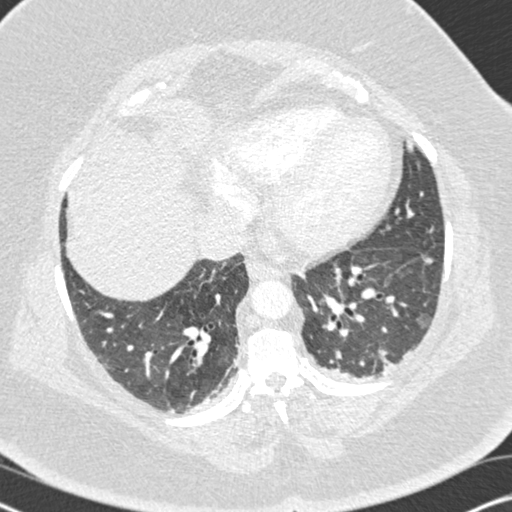
[im 114/285  mediastinal]
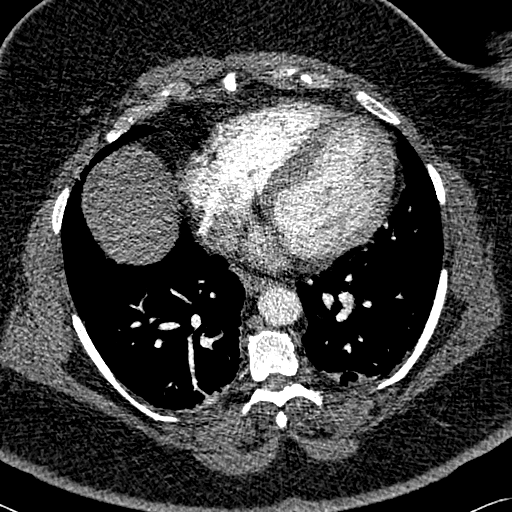
[im 128/285  lung]
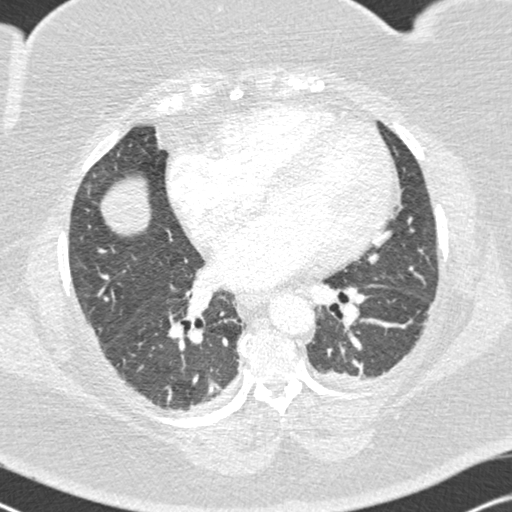
[im 143/285  mediastinal]
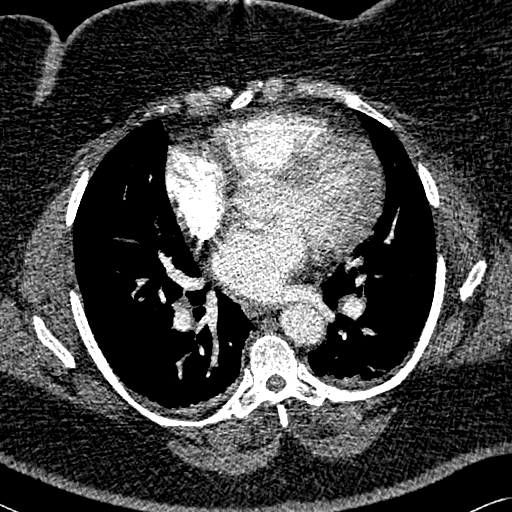
[im 157/285  lung]
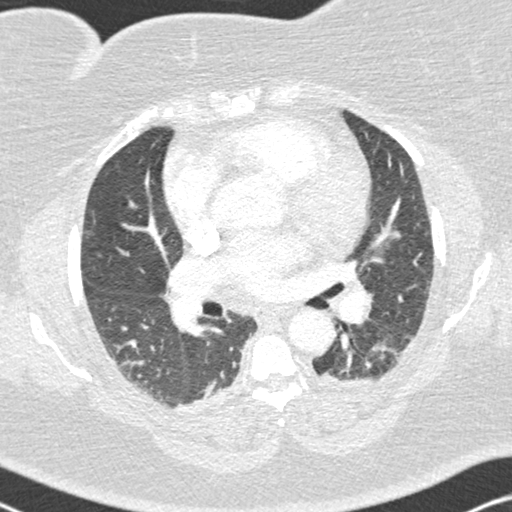
[im 171/285  mediastinal]
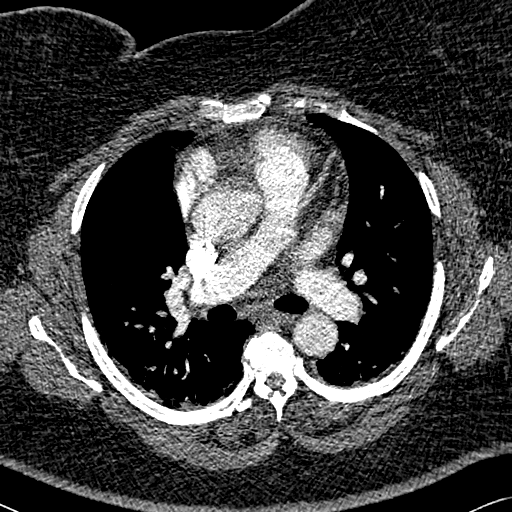
[im 185/285  lung]
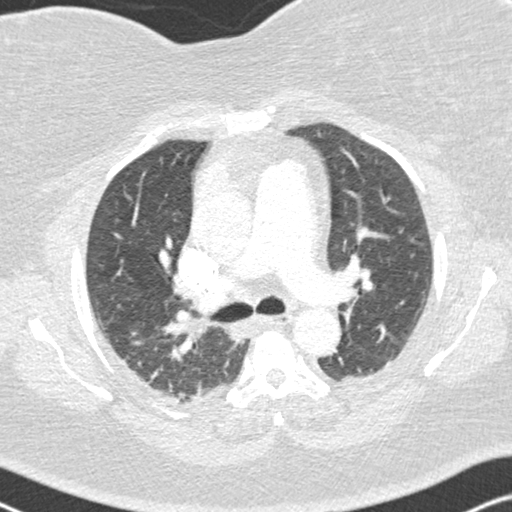
[im 190/285  mediastinal]
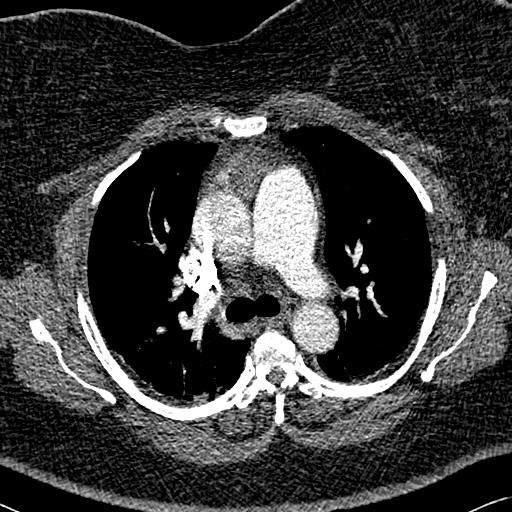
[im 199/285  lung]
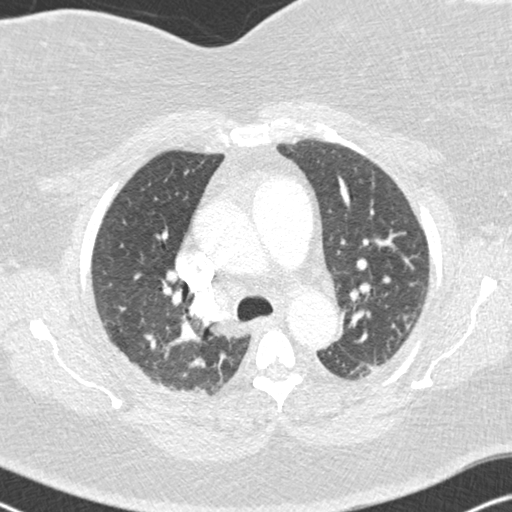
[im 214/285  mediastinal]
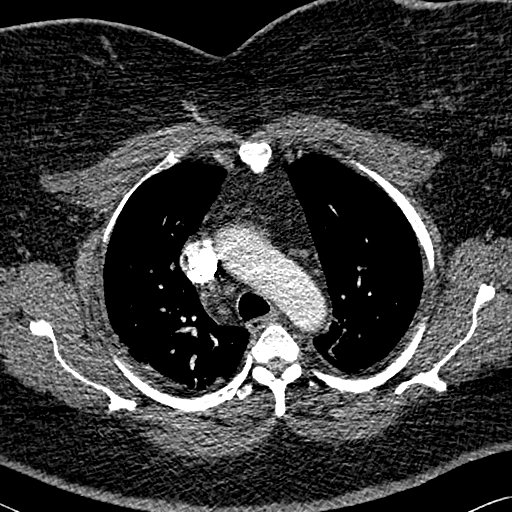
[im 242/285  lung]
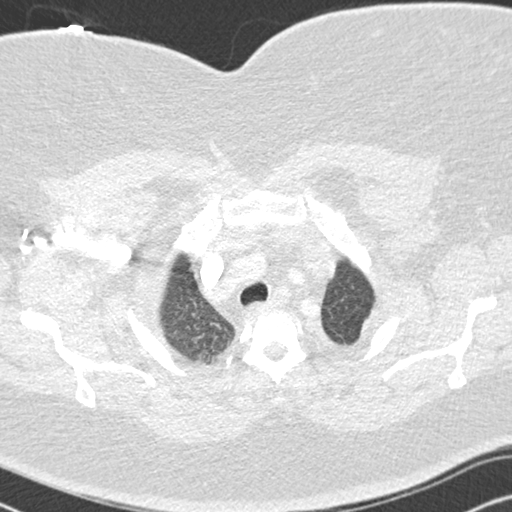
[im 256/285  mediastinal]
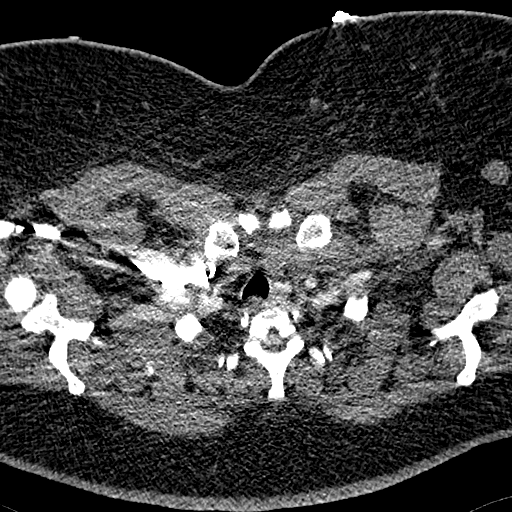
[im 270/285  lung]
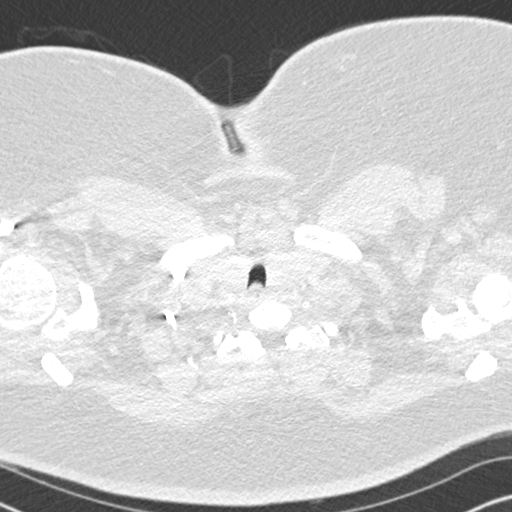

[19 of 32 positions shown; findings below may reference images not displayed]

FINDINGS: This study is technically limited; there is artifact, in
part related to patient body habitus.  There is cardiomegaly.  The
thoracic aorta is normal in caliber.  No evidence of aortic
dissection.

No definite filling defect is identified within the pulmonary
arterial tree, although opacification of the pulmonary arteries is
somewhat limited.

Scattered sub centimeter mediastinal lymph nodes.  No adenopathy in
the chest.

Negative for pleural or pericardial effusion.

There are significant areas of atelectasis in both lower lobes
posteriorly.  No focal airspace disease is identified.  The trachea
and mainstem bronchi are patent.  No acute osseous abnormality.
IMPRESSION: 1.  Technically limited evaluation of the pulmonary arterial tree.
No pulmonary embolism is identified. Small emboli could be
undetected, given the limitations.
2.  Prominent areas of atelectasis in both lower lobes.
3.  Mild cardiomegaly.

## 2009-08-14 LAB — COMPREHENSIVE METABOLIC PANEL
Albumin: 3.9 g/dL (ref 3.5–5.2)
Alkaline Phosphatase: 71 U/L (ref 39–117)
BUN: 6 mg/dL (ref 6–23)
Calcium: 9.4 mg/dL (ref 8.4–10.5)
Creatinine, Ser: 0.68 mg/dL (ref 0.40–1.20)
Glucose, Bld: 121 mg/dL — ABNORMAL HIGH (ref 70–99)
Potassium: 3.9 mEq/L (ref 3.5–5.3)

## 2009-08-14 LAB — CBC WITH DIFFERENTIAL (CANCER CENTER ONLY)
BASO#: 0 10*3/uL (ref 0.0–0.2)
Eosinophils Absolute: 0.5 10*3/uL (ref 0.0–0.5)
HCT: 36.6 % (ref 34.8–46.6)
HGB: 12 g/dL (ref 11.6–15.9)
MCH: 27.5 pg (ref 26.0–34.0)
MCHC: 32.8 g/dL (ref 32.0–36.0)
MCV: 84 fL (ref 81–101)
MONO%: 8.8 % (ref 0.0–13.0)
NEUT#: 3.2 10*3/uL (ref 1.5–6.5)
NEUT%: 57.8 % (ref 39.6–80.0)
RBC: 4.36 10*6/uL (ref 3.70–5.32)

## 2009-08-28 LAB — CBC WITH DIFFERENTIAL (CANCER CENTER ONLY)
BASO#: 0 10*3/uL (ref 0.0–0.2)
BASO%: 0.5 % (ref 0.0–2.0)
Eosinophils Absolute: 0.3 10*3/uL (ref 0.0–0.5)
HCT: 39.9 % (ref 34.8–46.6)
HGB: 13 g/dL (ref 11.6–15.9)
LYMPH#: 1 10*3/uL (ref 0.9–3.3)
LYMPH%: 22.5 % (ref 14.0–48.0)
MCV: 83 fL (ref 81–101)
MONO#: 0.4 10*3/uL (ref 0.1–0.9)
NEUT%: 63.4 % (ref 39.6–80.0)
RBC: 4.78 10*6/uL (ref 3.70–5.32)
WBC: 4.6 10*3/uL (ref 3.9–10.0)

## 2009-08-28 LAB — BASIC METABOLIC PANEL - CANCER CENTER ONLY
CO2: 29 mEq/L (ref 18–33)
Calcium: 9.9 mg/dL (ref 8.0–10.3)
Chloride: 109 mEq/L — ABNORMAL HIGH (ref 98–108)
Creat: 0.8 mg/dl (ref 0.6–1.2)
Glucose, Bld: 151 mg/dL — ABNORMAL HIGH (ref 73–118)

## 2009-09-09 ENCOUNTER — Inpatient Hospital Stay (HOSPITAL_COMMUNITY): Admission: RE | Admit: 2009-09-09 | Discharge: 2009-09-15 | Payer: Self-pay | Admitting: Orthopedic Surgery

## 2009-10-13 ENCOUNTER — Ambulatory Visit (HOSPITAL_COMMUNITY): Admission: RE | Admit: 2009-10-13 | Discharge: 2009-10-13 | Payer: Self-pay | Admitting: Internal Medicine

## 2009-10-13 ENCOUNTER — Encounter (INDEPENDENT_AMBULATORY_CARE_PROVIDER_SITE_OTHER): Payer: Self-pay | Admitting: Internal Medicine

## 2009-10-21 ENCOUNTER — Ambulatory Visit (HOSPITAL_COMMUNITY): Admission: RE | Admit: 2009-10-21 | Discharge: 2009-10-21 | Payer: Self-pay | Admitting: Hematology & Oncology

## 2009-11-10 ENCOUNTER — Ambulatory Visit: Payer: Self-pay | Admitting: Hematology & Oncology

## 2009-11-13 LAB — COMPREHENSIVE METABOLIC PANEL
ALT: 17 U/L (ref 0–35)
AST: 25 U/L (ref 0–37)
Alkaline Phosphatase: 82 U/L (ref 39–117)
BUN: 9 mg/dL (ref 6–23)
Calcium: 9.1 mg/dL (ref 8.4–10.5)
Chloride: 107 mEq/L (ref 96–112)
Creatinine, Ser: 0.57 mg/dL (ref 0.40–1.20)
Total Bilirubin: 0.4 mg/dL (ref 0.3–1.2)

## 2009-11-13 LAB — CBC WITH DIFFERENTIAL (CANCER CENTER ONLY)
BASO%: 0.5 % (ref 0.0–2.0)
EOS%: 11.3 % — ABNORMAL HIGH (ref 0.0–7.0)
HCT: 37.5 % (ref 34.8–46.6)
LYMPH#: 1.5 10*3/uL (ref 0.9–3.3)
MCH: 25.9 pg — ABNORMAL LOW (ref 26.0–34.0)
MCHC: 32 g/dL (ref 32.0–36.0)
MONO%: 7.2 % (ref 0.0–13.0)
NEUT%: 57.8 % (ref 39.6–80.0)
RDW: 15.3 % — ABNORMAL HIGH (ref 10.5–14.6)

## 2009-11-16 LAB — WOUND CULTURE

## 2009-11-22 IMAGING — CR DG CHEST 2V
2 series · 2 of 2 positions shown · non-contrast
Comparison: 04/19/2008

CLINICAL DATA: 57-year-old female asthma, shortness of breath,
productive cough

CHEST - 2 VIEW

[view not recorded (1 of 2)]
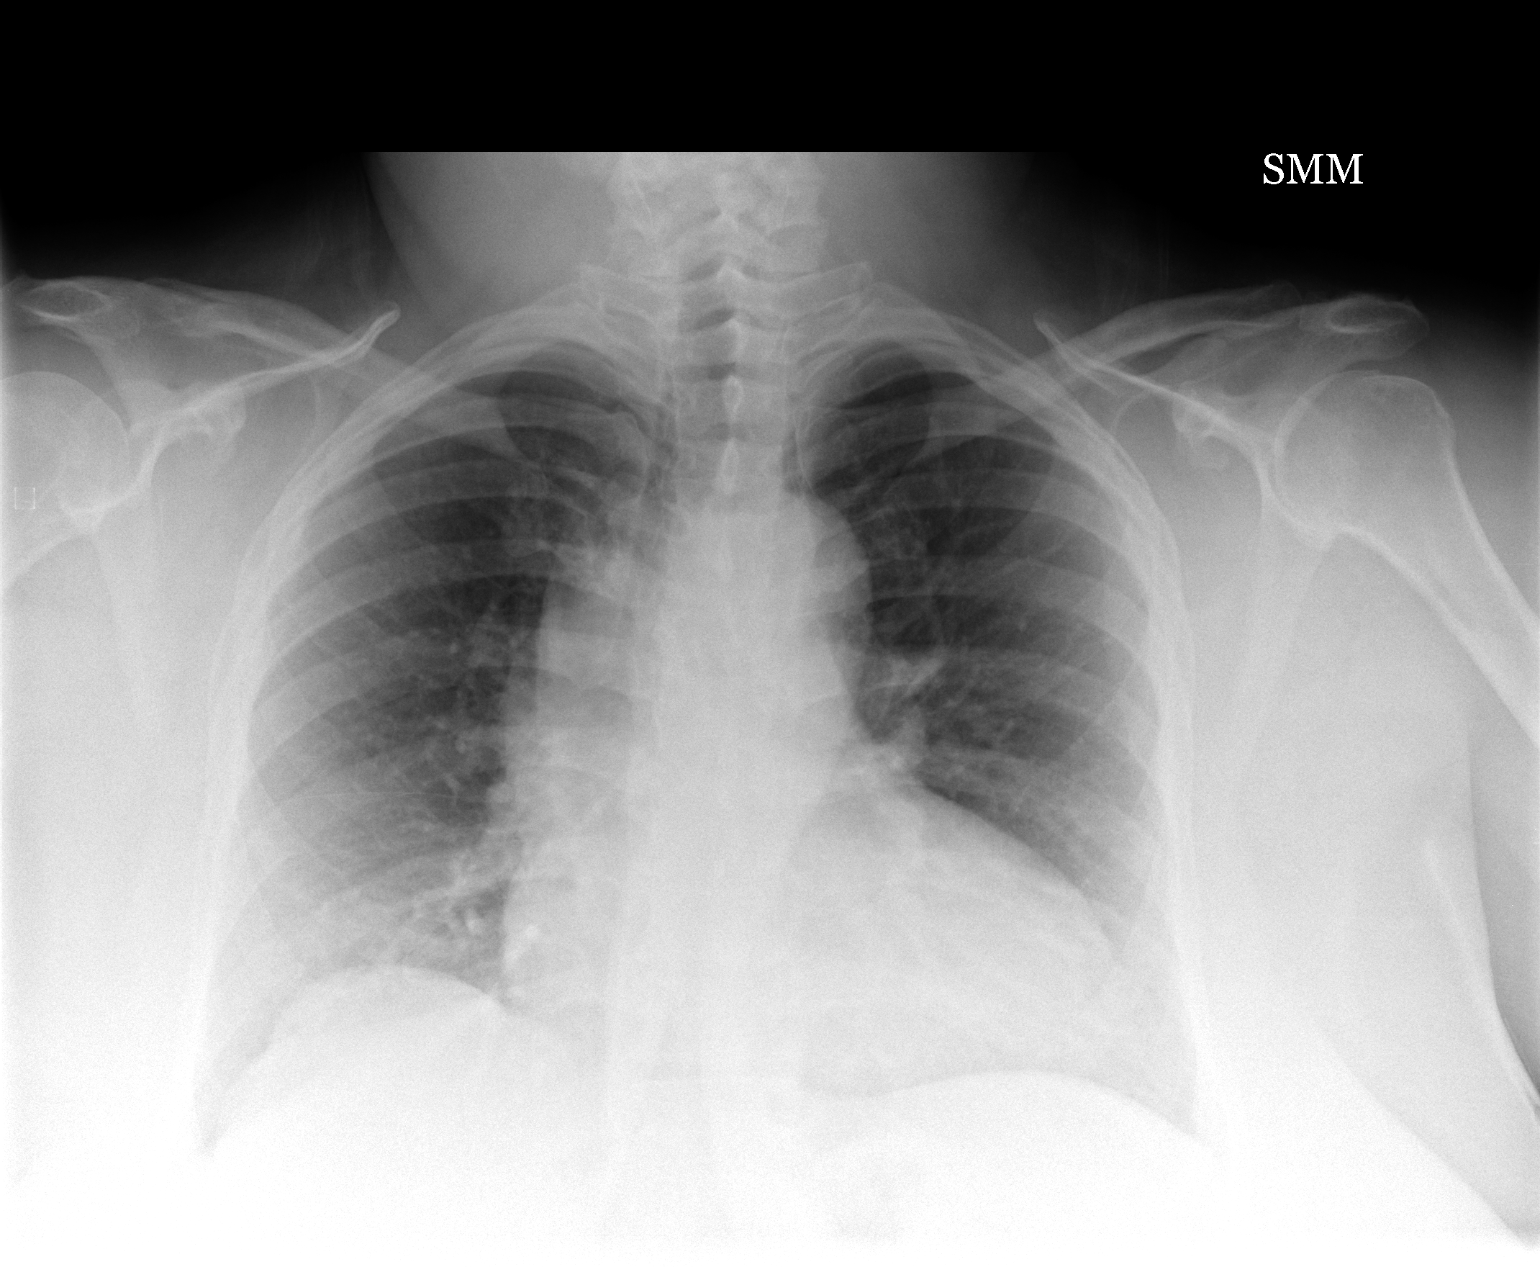

[view not recorded (2 of 2)]
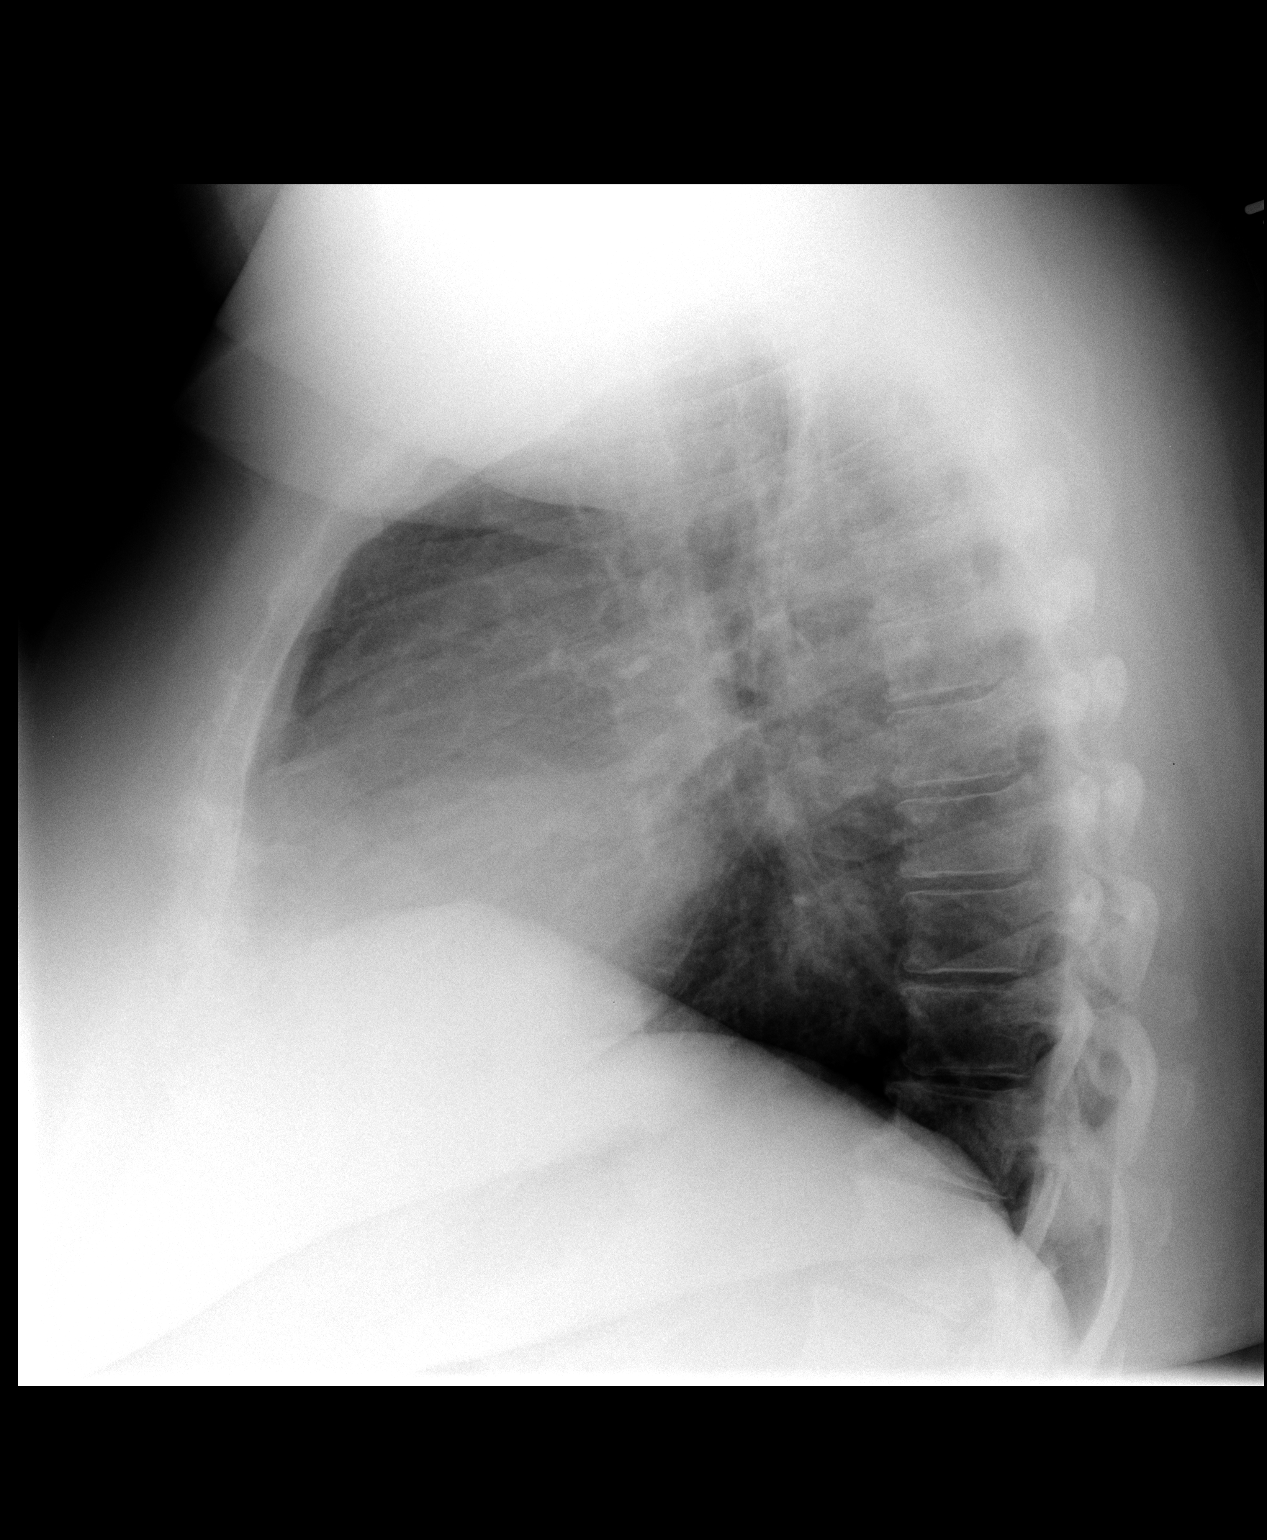

[2 of 2 positions shown; findings below may reference images not displayed]

FINDINGS: Low volume exam.  Study is limited because of patient
size.  Prominent heart size and mediastinal contours as before.  No
new superimposed pneumonia, edema, large effusion or pneumothorax.
Trachea is midline.  Exam is stable.
IMPRESSION: Cardiomegaly without CHF or pneumonia.

## 2009-11-22 IMAGING — CT CT ANGIO CHEST
2 of 7 series · 19 of 36 positions shown · IV contrast (APPLIED)
Comparison: CT thorax 04/19/2008

CLINICAL DATA: Asthma, short of breath, right upper quadrant pain,
elevated D-dimer.  Morbid obesity.

CT ANGIOGRAPHY CHEST
TECHNIQUE: Multidetector CT imaging of the chest using the
standard protocol during bolus administration of intravenous
contrast. Multiplanar reconstructed images including MIPs were
obtained and reviewed to evaluate the vascular anatomy.
Contrast: 100 ml  Omni 300

[Series 8: pulm embolism 1.0 b25f thins · axial · 0.63mm/px · z∈[-252,-48]mm · 18 of 228 slices shown]
[im 12/228  lung]
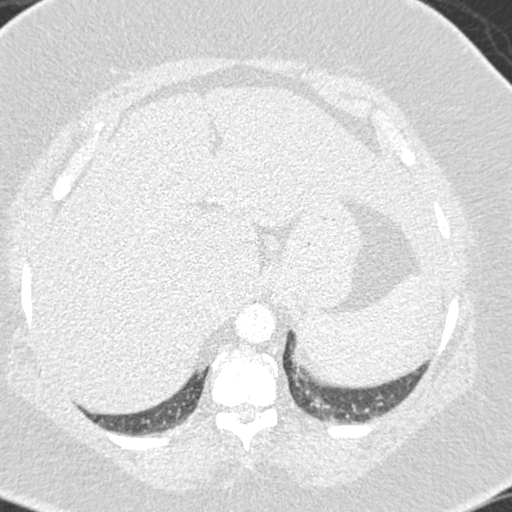
[im 24/228  mediastinal]
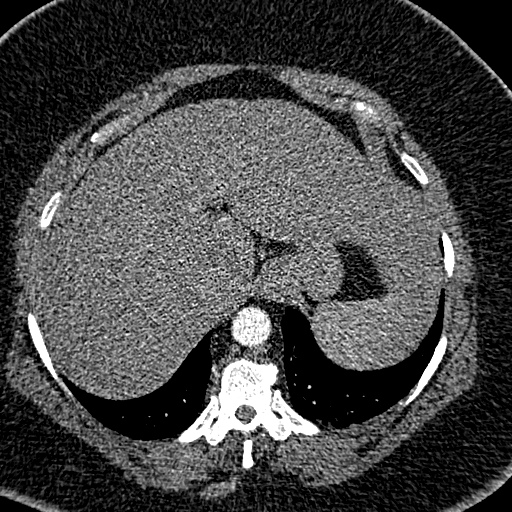
[im 36/228  lung]
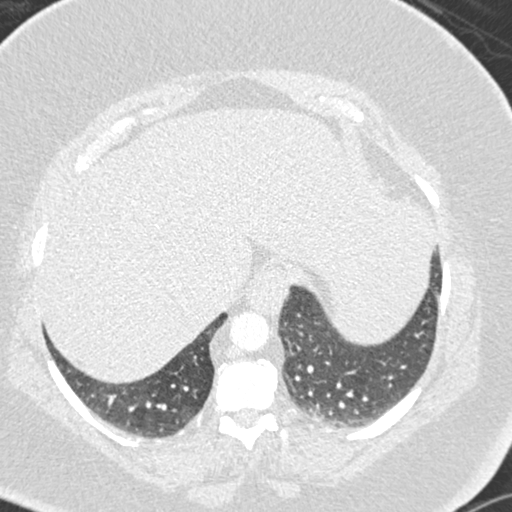
[im 48/228  mediastinal]
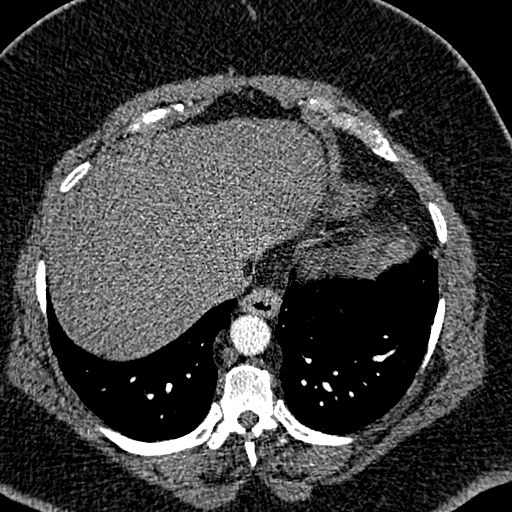
[im 60/228  lung]
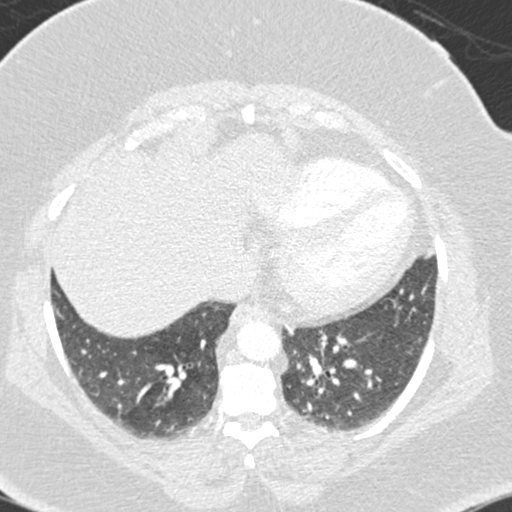
[im 72/228  mediastinal]
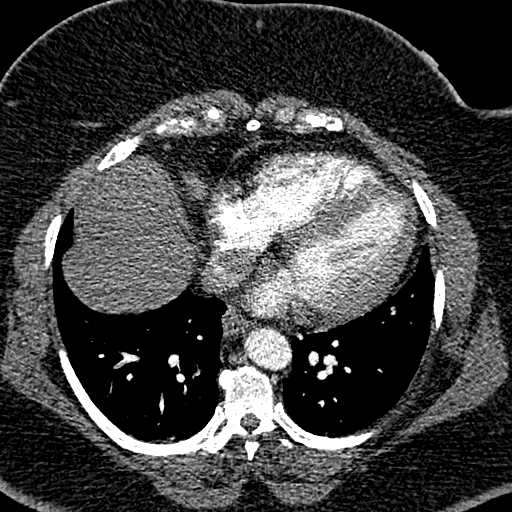
[im 84/228  lung]
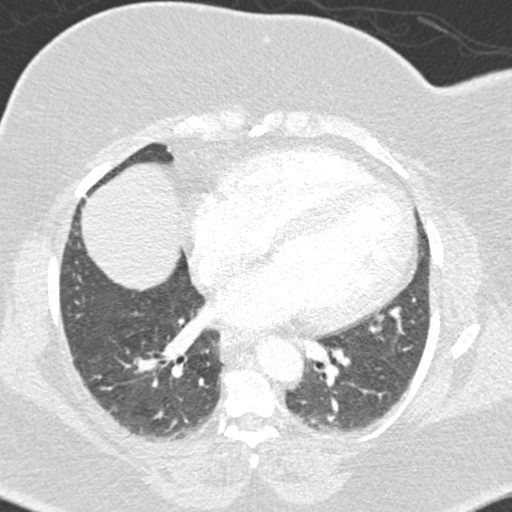
[im 96/228  mediastinal]
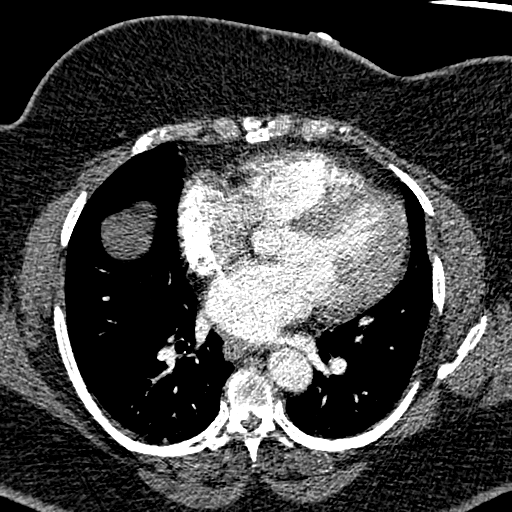
[im 108/228  lung]
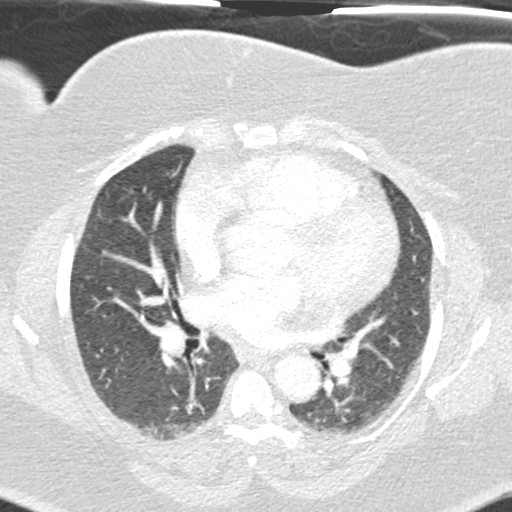
[im 120/228  mediastinal]
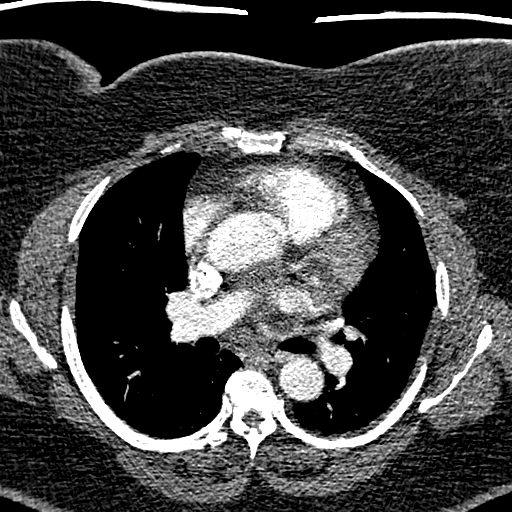
[im 132/228  lung]
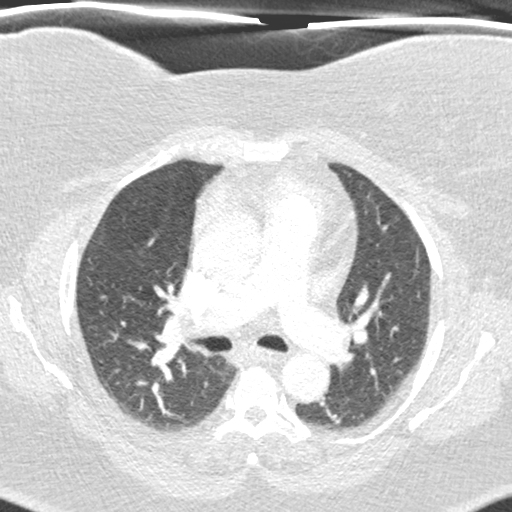
[im 144/228  mediastinal]
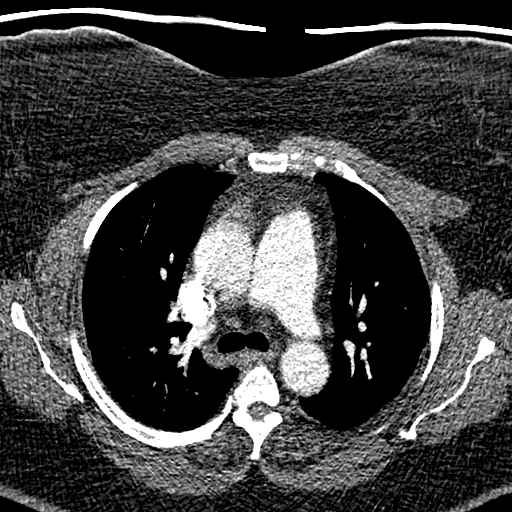
[im 156/228  lung]
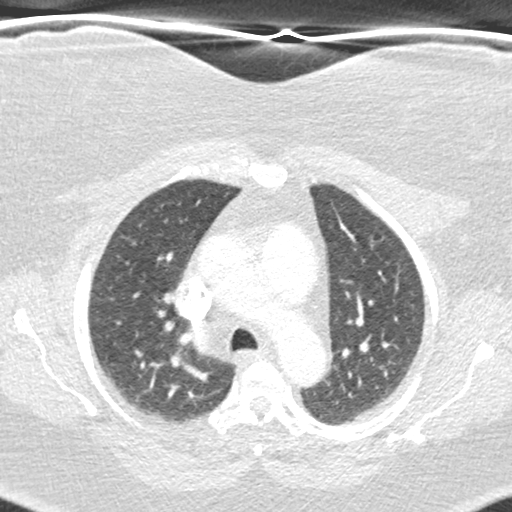
[im 168/228  mediastinal]
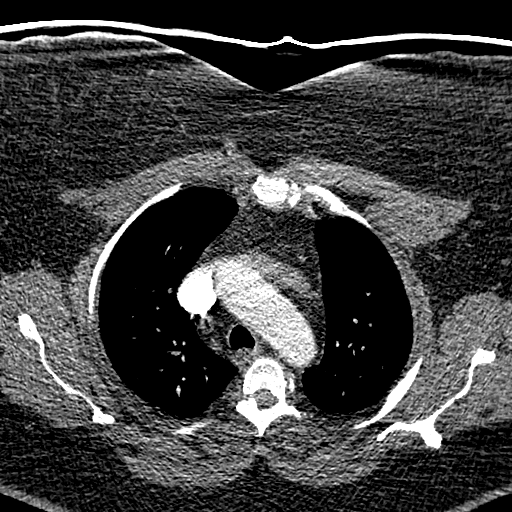
[im 180/228  lung]
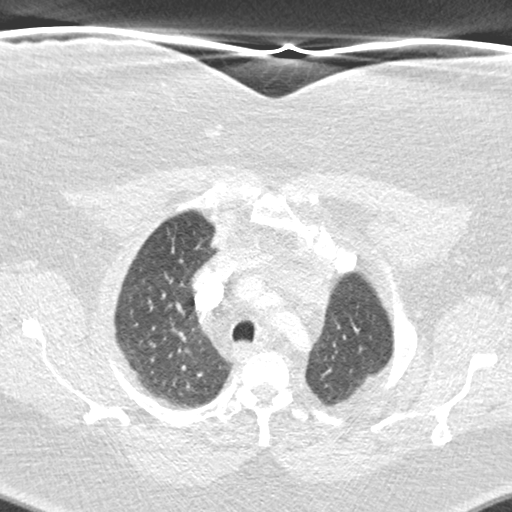
[im 192/228  mediastinal]
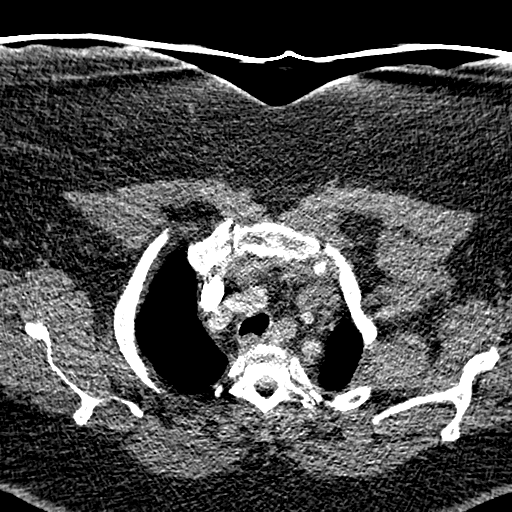
[im 204/228  lung]
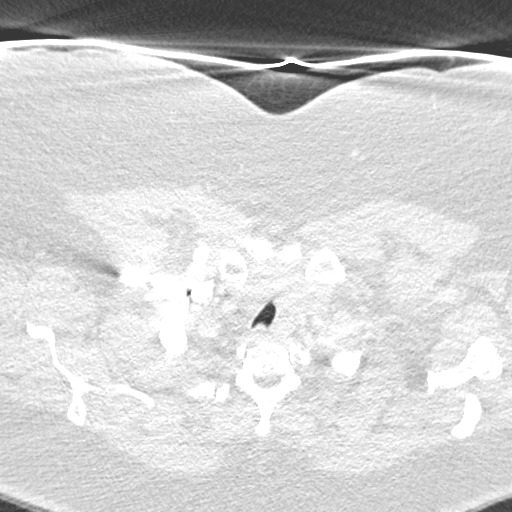
[im 216/228  mediastinal]
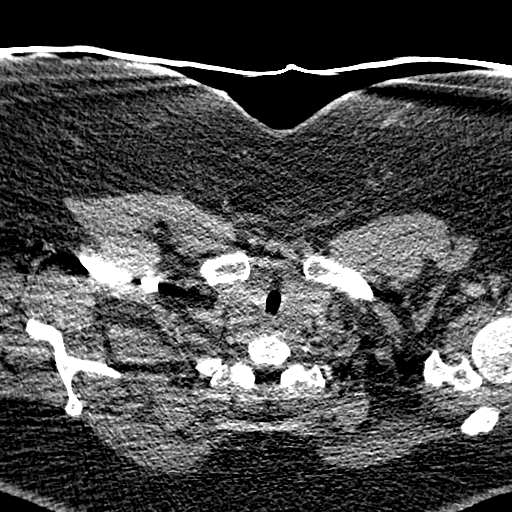

[Series 602: chest cor · coronal · 0.63mm/px · 1 of 111 slices shown]
[im 56/111  mediastinal]
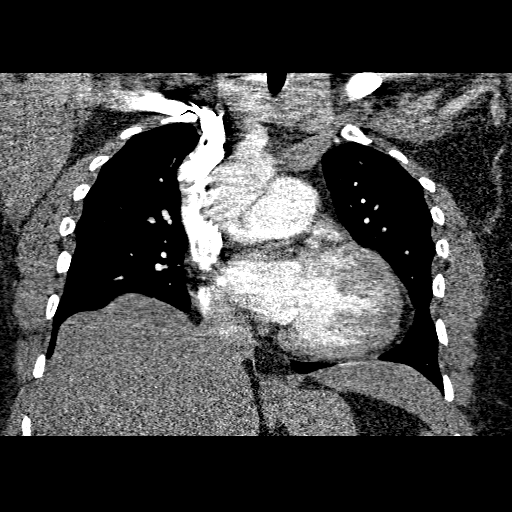

[19 of 36 positions shown; findings below may reference images not displayed]

FINDINGS: The study is technically limited secondary to patient
body habitus.  This limits evaluation of the subsegmental and
distal segmental pulmonary arteries.  No gross evidence of
pulmonary embolism in the main pulmonary arteries or proximal
segmental pulmonary arteries.  These findings are similar to prior.

No evidence of pericardial effusion.

Review of the lung parenchyma demonstrates no evidence of effusion,
infiltrate, pneumothorax, or focal consolidation.  The airway
appears normal.

Limited view of the upper abdomen is unremarkable.

Limited view of the skeleton is unremarkable.
IMPRESSION: No gross evidence of pulmonary embolism in study limited by patient
body habitus as described above.  This limitation is similar to
comparison exam of 04/19/2008.

## 2009-11-23 ENCOUNTER — Encounter (HOSPITAL_COMMUNITY): Admission: RE | Admit: 2009-11-23 | Discharge: 2010-02-21 | Payer: Self-pay | Admitting: Internal Medicine

## 2009-12-01 IMAGING — US US ABDOMEN COMPLETE
1 series · 14 of 25 positions shown · non-contrast
Comparison: None

CLINICAL DATA: Abdominal pain.  Diabetes.

ABDOMEN ULTRASOUND
TECHNIQUE: Complete abdominal ultrasound examination was performed
including evaluation of the liver, gallbladder, bile ducts,
pancreas, kidneys, spleen, IVC, and abdominal aorta.

[Series 1: unknown · 0.41mm/px · 14 of 79 slices shown]
[im 1/79]
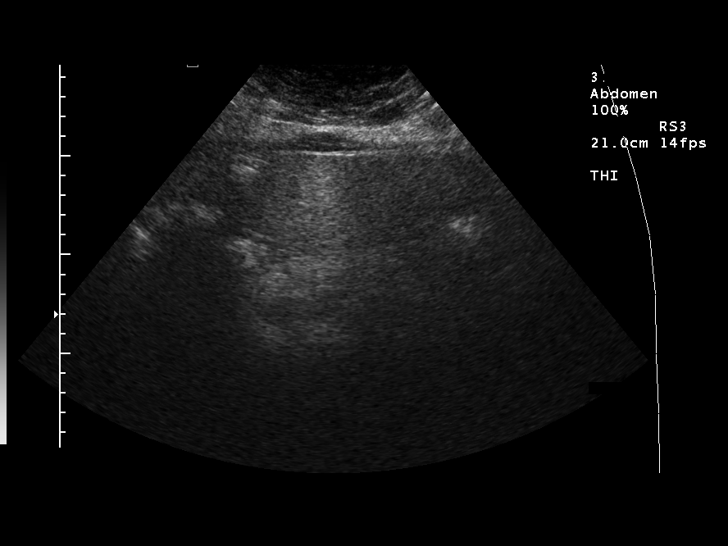
[im 7/79]
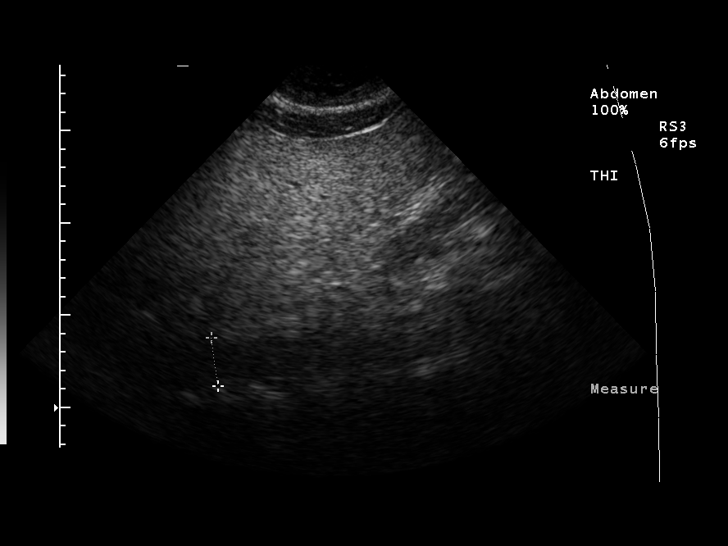
[im 14/79]
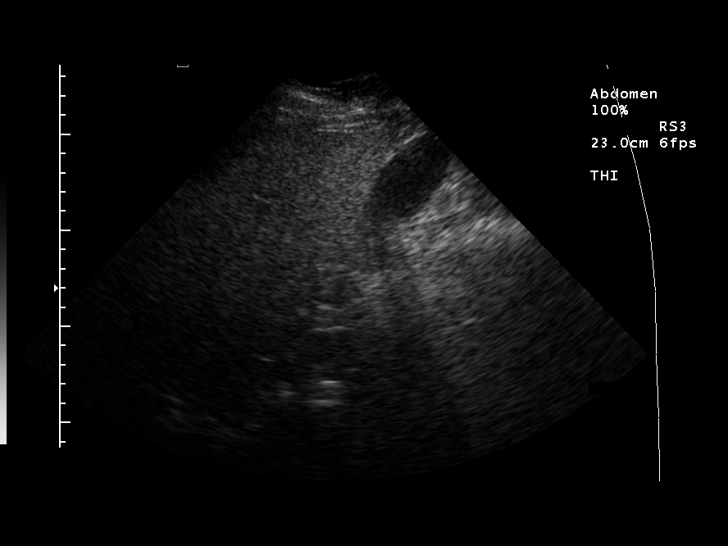
[im 20/79]
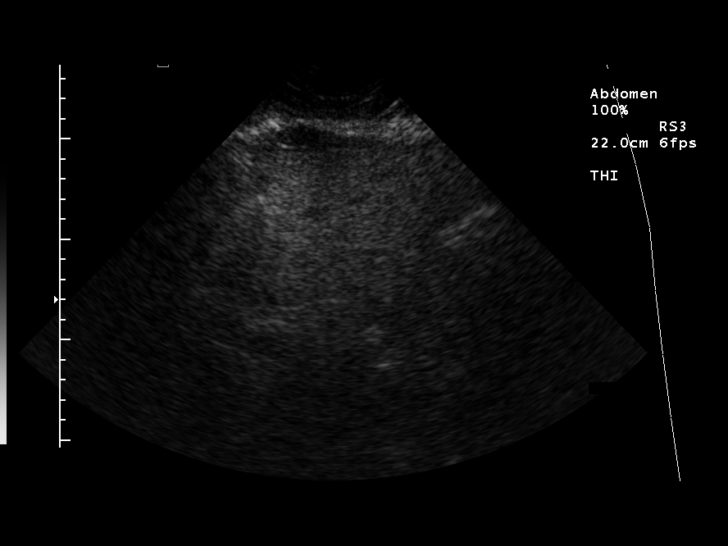
[im 27/79]
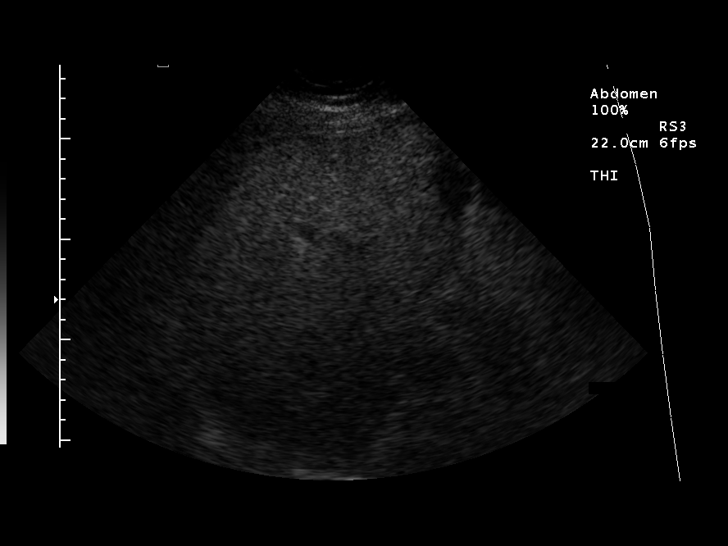
[im 30/79]
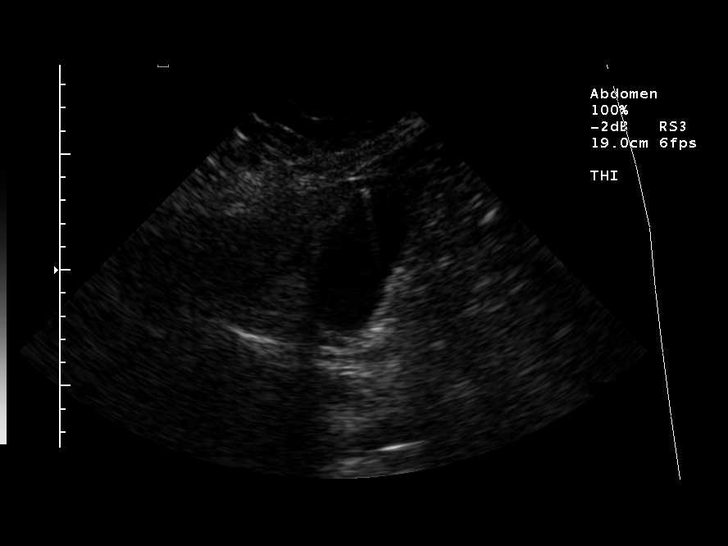
[im 36/79]
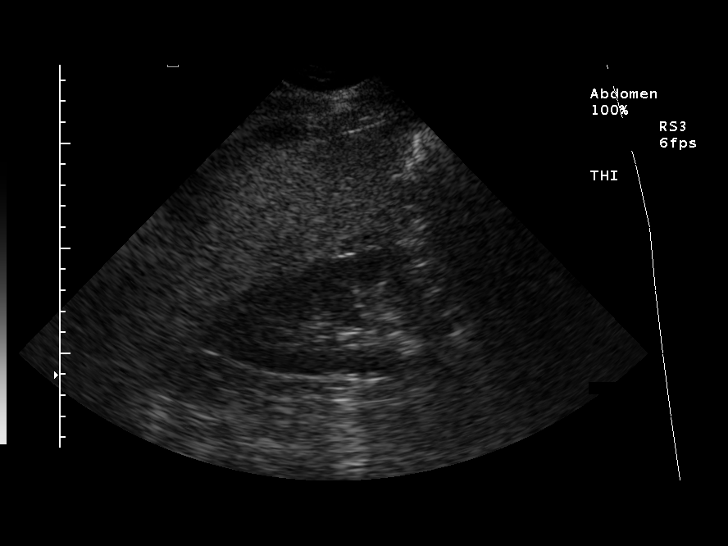
[im 43/79]
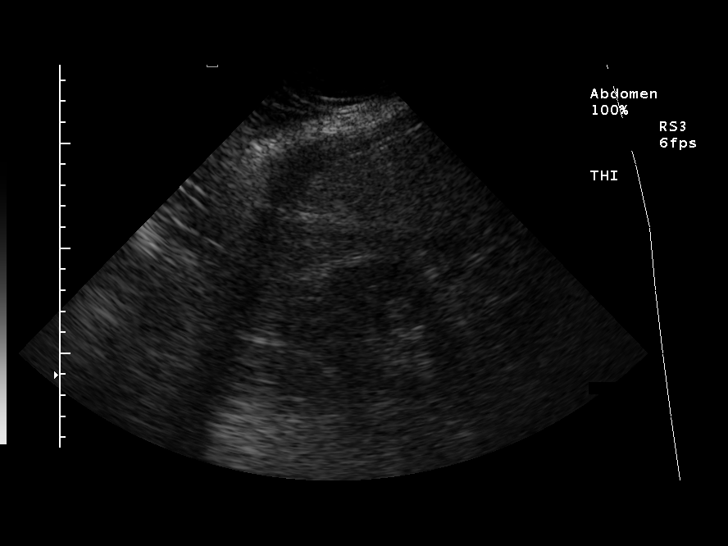
[im 49/79]
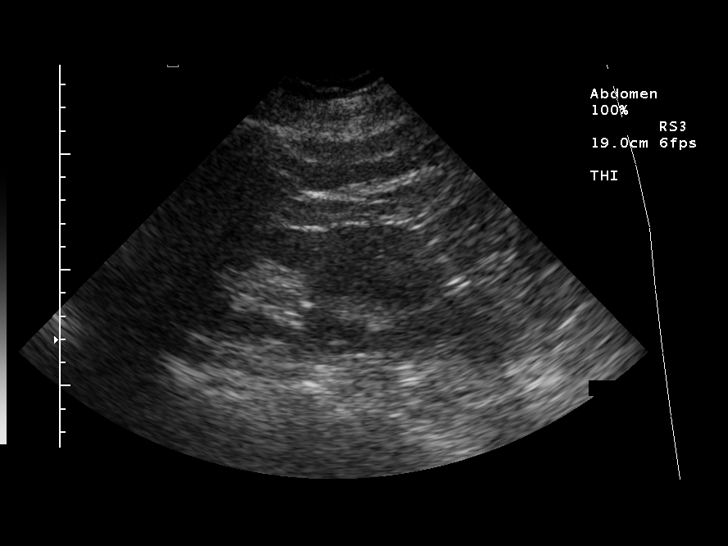
[im 53/79]
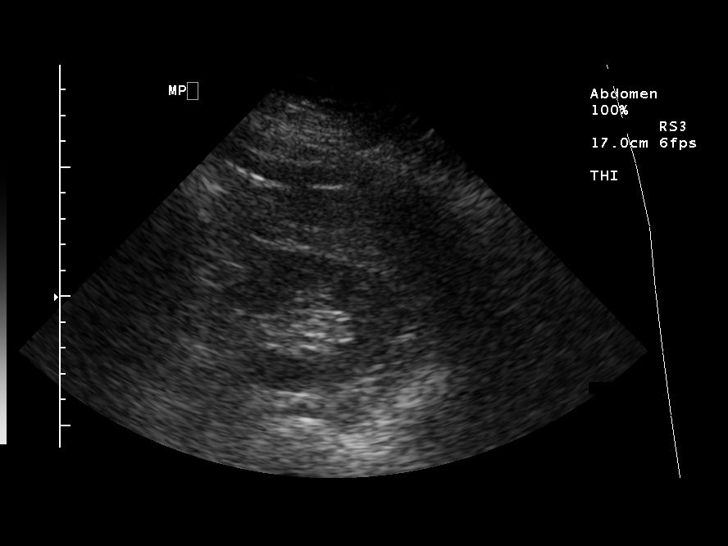
[im 59/79]
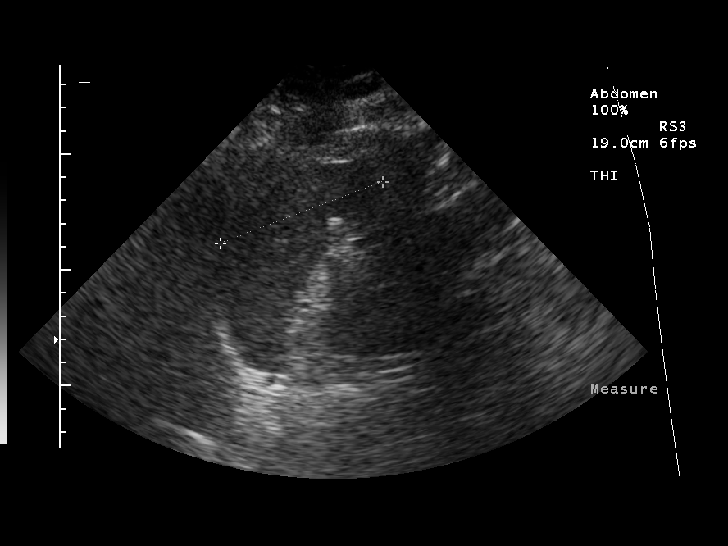
[im 66/79]
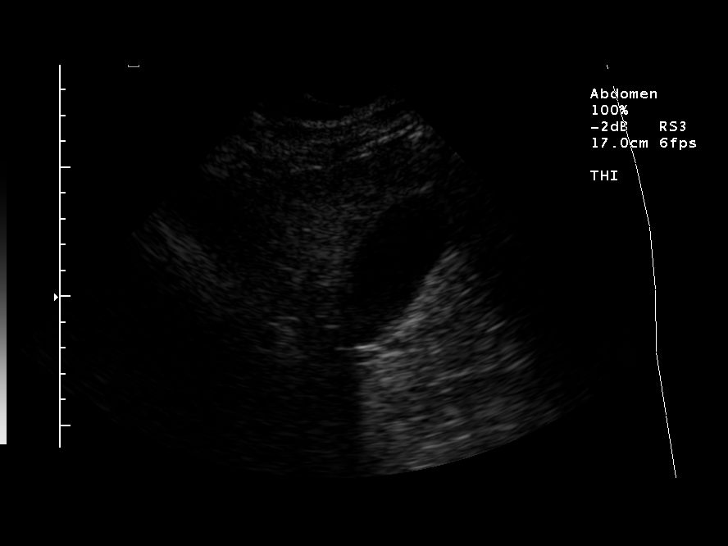
[im 72/79]
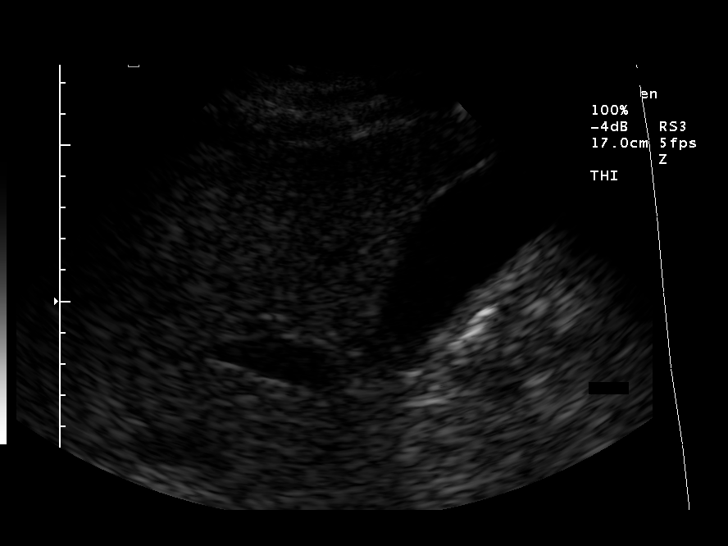
[im 79/79]
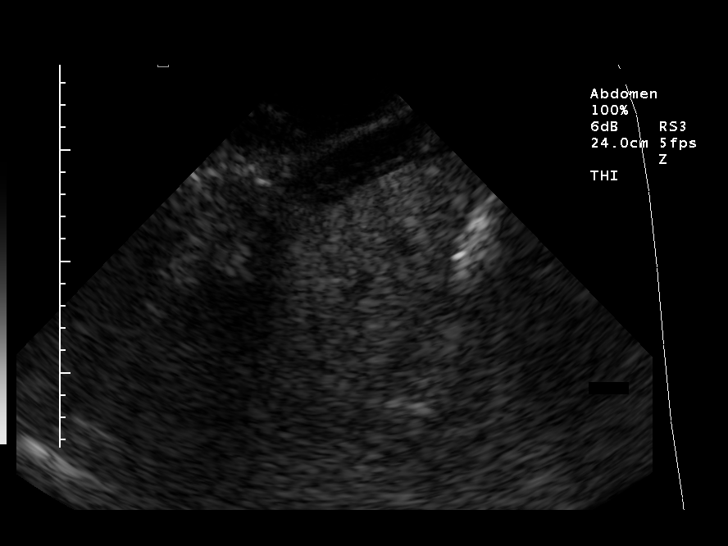

[14 of 25 positions shown; findings below may reference images not displayed]

FINDINGS: There is ring-down artifact from the anterior fundal wall
of the gallbladder.  This may be due to adenomyomatosis. No
gallstones.  Gallbladder wall thickness is 2.7 mm.  No biliary duct
dilatation.  Common duct measures 5.5 mm in diameter.  Diffuse
increase in hepatic echogenicity compatible with diffuse
hepatocellular disease, most likely fatty infiltration of the
liver.  The IVC is patent.  Pancreatic head and body appear
unremarkable.  Pancreatic tail is not well visualized.  Spleen
unremarkable, measuring 7.5 cm in length.  The right and left
kidneys measure 12.2 cm and 12.5 cm in length, respectively.  No
renal abnormality.  The maximum diameter of the abdominal aorta is
2.6 cm.
IMPRESSION: Focal adenomyomatosis of the gallbladder may be present.  No
gallstones.  No biliary ductal dilatation.  Fatty infiltration of
the liver.

## 2009-12-15 ENCOUNTER — Ambulatory Visit: Payer: Self-pay | Admitting: Hematology & Oncology

## 2009-12-22 ENCOUNTER — Encounter: Admission: RE | Admit: 2009-12-22 | Discharge: 2009-12-22 | Payer: Self-pay | Admitting: Pediatrics

## 2009-12-31 LAB — CBC WITH DIFFERENTIAL (CANCER CENTER ONLY)
BASO%: 0.6 % (ref 0.0–2.0)
EOS%: 5.9 % (ref 0.0–7.0)
LYMPH#: 1.7 10*3/uL (ref 0.9–3.3)
MCHC: 32.6 g/dL (ref 32.0–36.0)
MONO%: 8.1 % (ref 0.0–13.0)
NEUT#: 4.5 10*3/uL (ref 1.5–6.5)
Platelets: 392 10*3/uL (ref 145–400)
RDW: 15.5 % — ABNORMAL HIGH (ref 10.5–14.6)

## 2009-12-31 LAB — COMPREHENSIVE METABOLIC PANEL
ALT: 9 U/L (ref 0–35)
AST: 16 U/L (ref 0–37)
Albumin: 3.7 g/dL (ref 3.5–5.2)
Alkaline Phosphatase: 81 U/L (ref 39–117)
Calcium: 9 mg/dL (ref 8.4–10.5)
Chloride: 105 mEq/L (ref 96–112)
Potassium: 3.9 mEq/L (ref 3.5–5.3)

## 2010-01-11 ENCOUNTER — Encounter (HOSPITAL_COMMUNITY): Admission: RE | Admit: 2010-01-11 | Discharge: 2010-03-31 | Payer: Self-pay | Admitting: Internal Medicine

## 2010-01-12 IMAGING — CR DG CHEST 2V
2 series · 2 of 2 positions shown · non-contrast
Comparison: 08/10/2008

CLINICAL DATA: Shortness of breath.  History of asthma.

CHEST - 2 VIEW

[w chest pa]
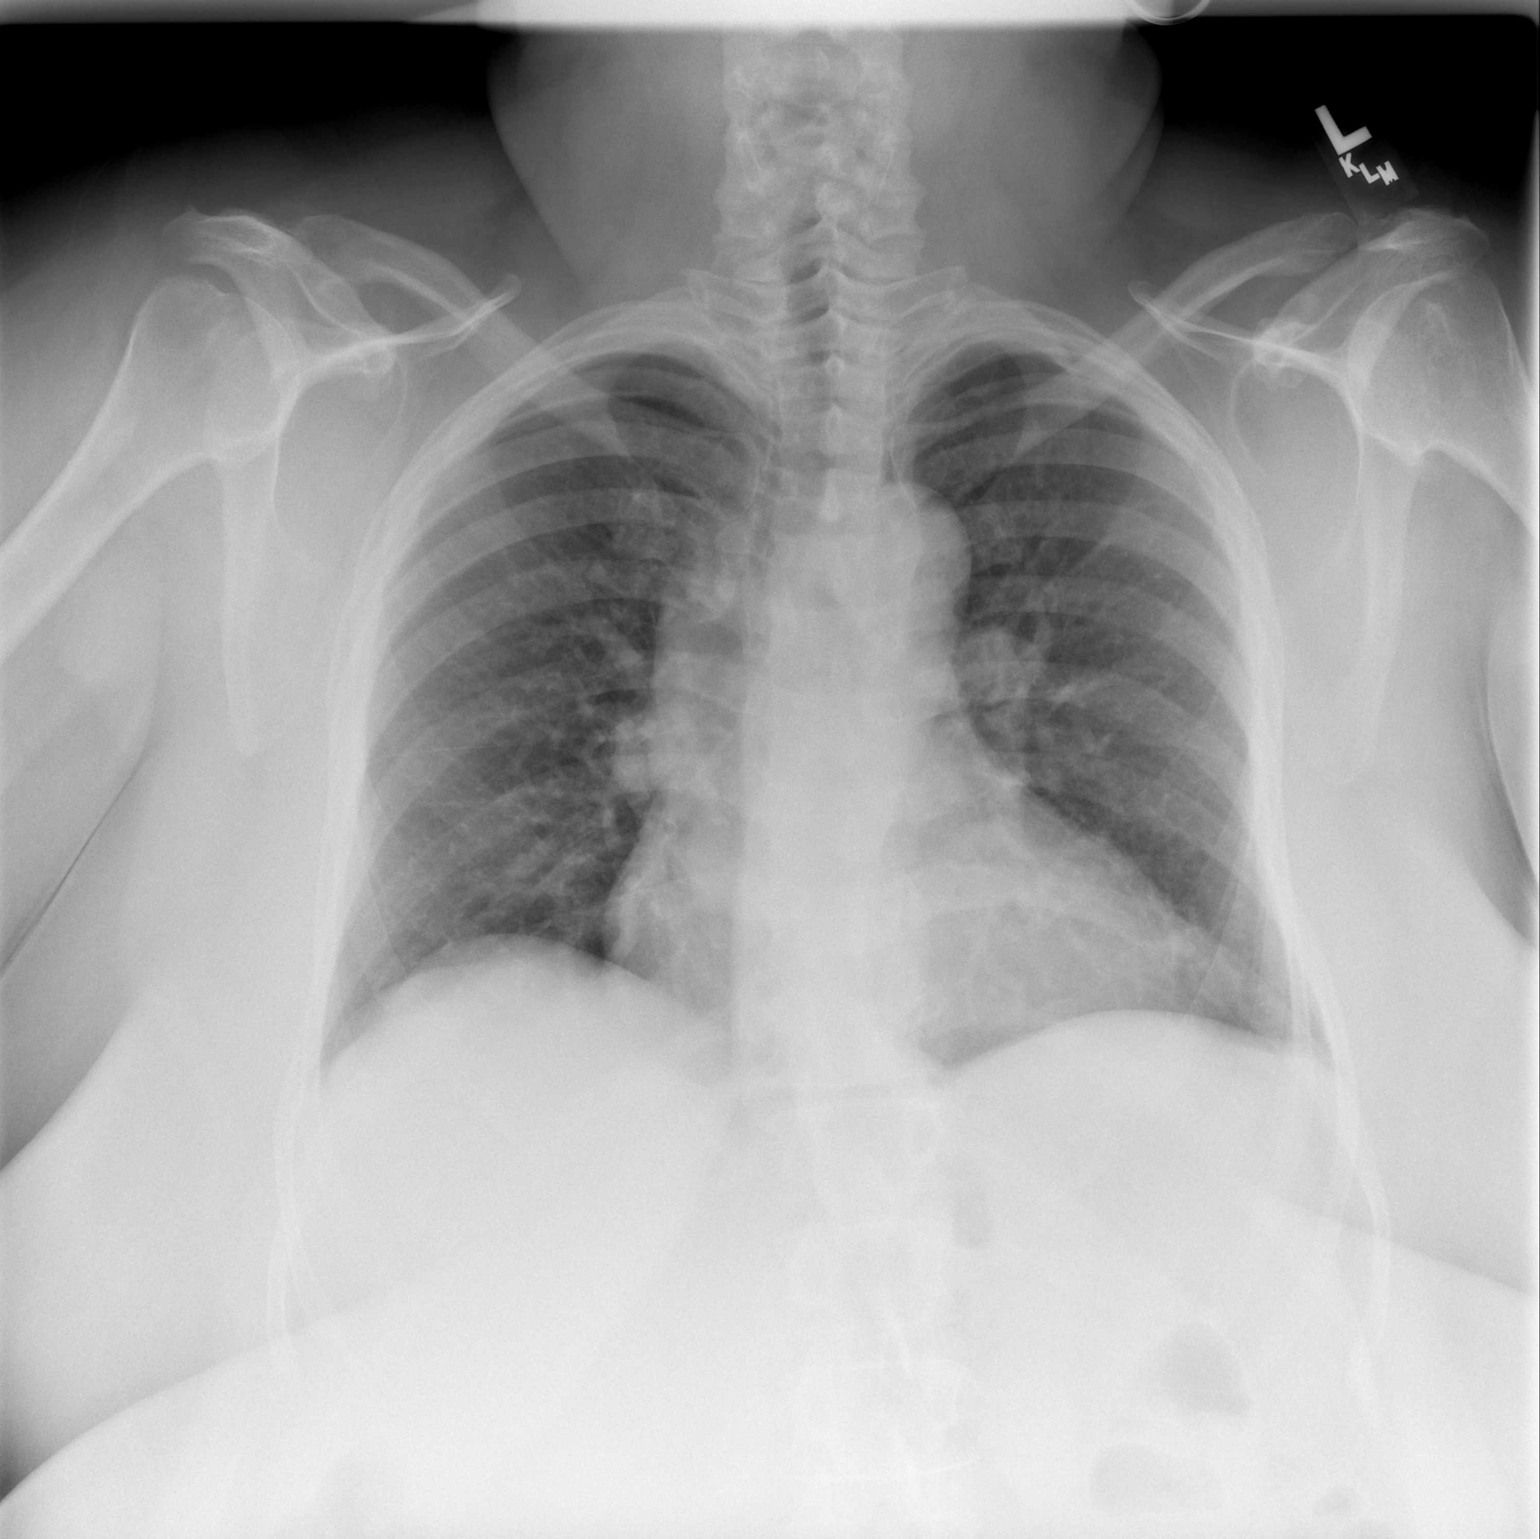

[w chest lat *]
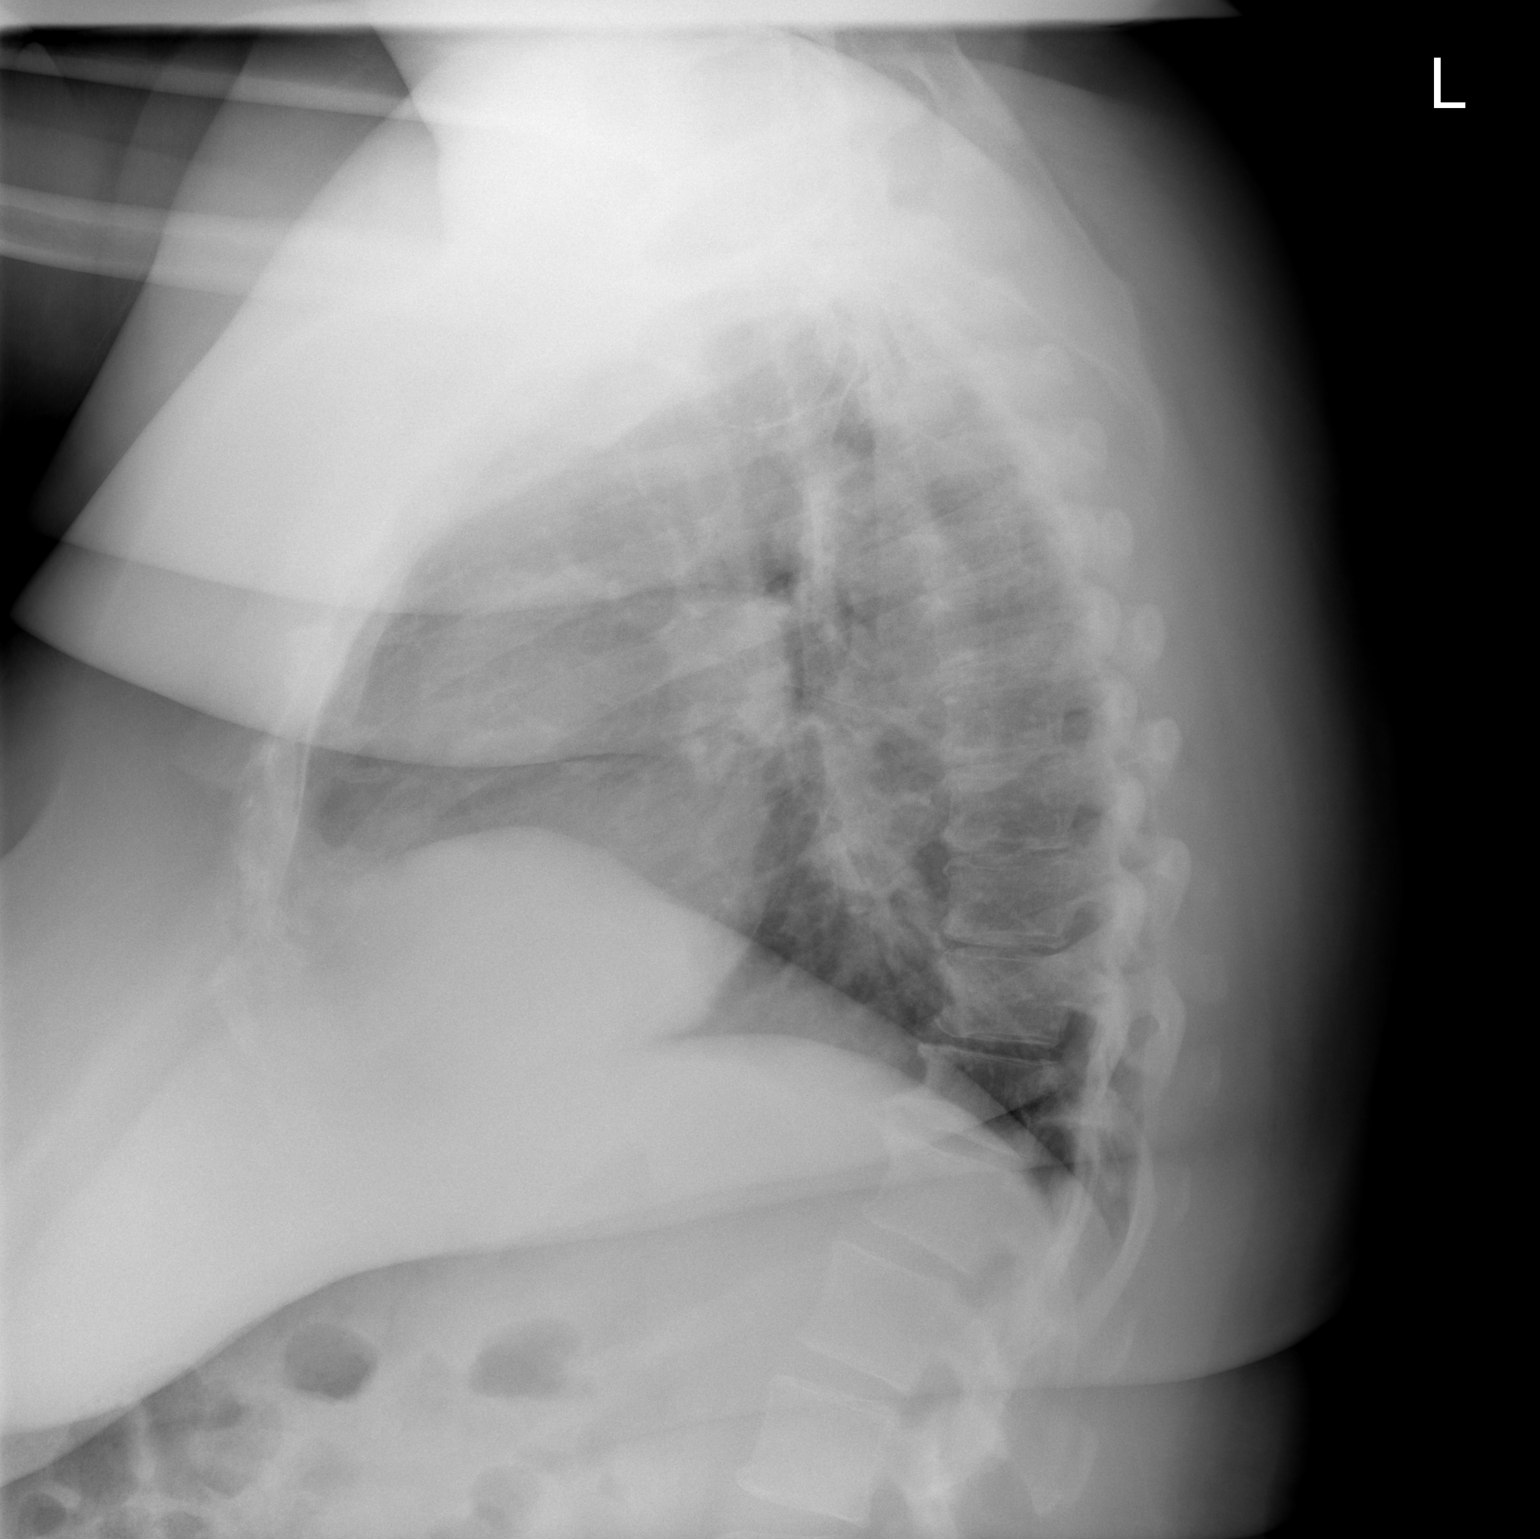

[2 of 2 positions shown; findings below may reference images not displayed]

FINDINGS: The heart is borderline enlarged but stable.  There is
tortuosity and mild ectasia of the thoracic aorta.  There are mild
chronic bronchitic type lung changes but no infiltrates, edema or
effusions.  The bony thorax is intact.  Stable degenerative
changes.
IMPRESSION: Mild chronic bronchitic type lung changes may be due to history of
asthma.  No acute pulmonary findings.

## 2010-01-27 IMAGING — NM NM HEPATO W/GB/PHARM/[PERSON_NAME]
3 series · 13 of 13 positions shown · non-contrast
Comparison: Ultrasound dated 08/19/2008

CLINICAL DATA: Right upper quadrant pain.

NUCLEAR MEDICINE HEPATOBILIARY IMAGING WITH GALLBLADDER EF
TECHNIQUE: Sequential images of the abdomen were obtained [DATE]
minutes following intravenous administration of
radiopharmaceutical.  After slow intravenous infusion of
3.14 uCg Cholecystokinin, gallbladder ejection fraction was
determined.
Radiopharmaceutical:  5.0 mCi 1c-ZZm Choletec

[he hepatobiliary · 3.22mm/px · 6 of 30 frames shown (1 of 3)]
[frame 3/30]
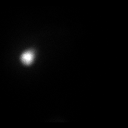
[frame 8/30]
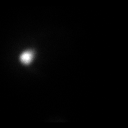
[frame 13/30]
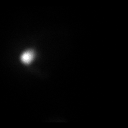
[frame 18/30]
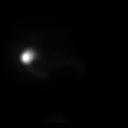
[frame 23/30]
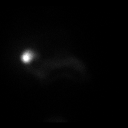
[frame 28/30]
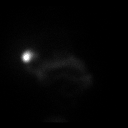

[he hepatobiliary · 1 of 1 slices shown (2 of 3)]
[im 1/1]
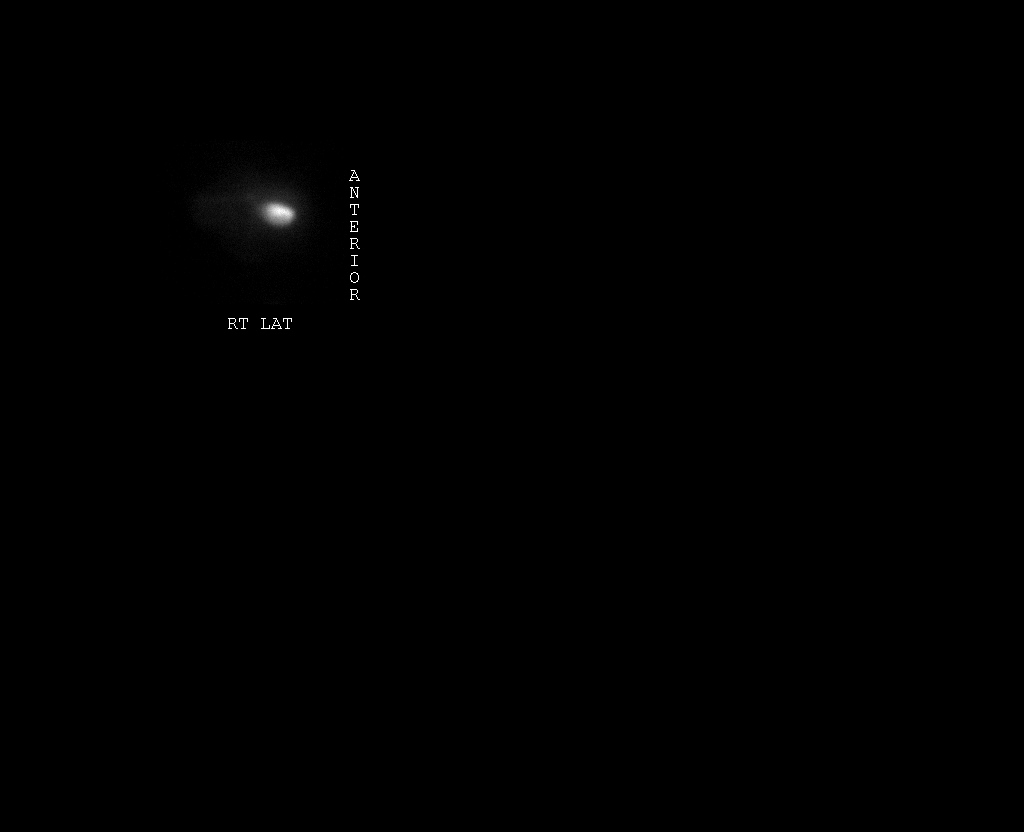

[he hepatobiliary · 3.22mm/px · 6 of 47 frames shown (3 of 3)]
[frame 4/47]
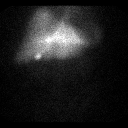
[frame 12/47]
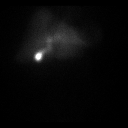
[frame 20/47]
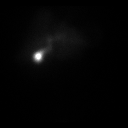
[frame 28/47]
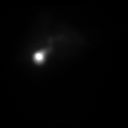
[frame 36/47]
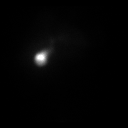
[frame 44/47]
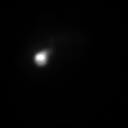

[13 of 13 positions shown; findings below may reference images not displayed]

FINDINGS: The gallbladder was visualized at 10 minutes. Activity
was seen in the bowel  5 minutes after CCK injection.  The ejection
fraction was 54.6% at 27 minutes, well within normal limits.

The patient did not experience symptoms during CCK infusion.
IMPRESSION: Normal hepatic biliary scan with CCK injection.

## 2010-02-04 ENCOUNTER — Ambulatory Visit: Payer: Self-pay | Admitting: Hematology & Oncology

## 2010-02-09 ENCOUNTER — Ambulatory Visit (HOSPITAL_COMMUNITY): Admission: RE | Admit: 2010-02-09 | Discharge: 2010-02-09 | Payer: Self-pay | Admitting: Hematology & Oncology

## 2010-02-18 LAB — CBC WITH DIFFERENTIAL (CANCER CENTER ONLY)
BASO#: 0 10*3/uL (ref 0.0–0.2)
EOS%: 7.9 % — ABNORMAL HIGH (ref 0.0–7.0)
HCT: 36.6 % (ref 34.8–46.6)
HGB: 12.1 g/dL (ref 11.6–15.9)
LYMPH#: 1.4 10*3/uL (ref 0.9–3.3)
MCHC: 33 g/dL (ref 32.0–36.0)
NEUT%: 50.2 % (ref 39.6–80.0)

## 2010-02-18 LAB — COMPREHENSIVE METABOLIC PANEL
AST: 14 U/L (ref 0–37)
BUN: 8 mg/dL (ref 6–23)
CO2: 25 mEq/L (ref 19–32)
Calcium: 9.8 mg/dL (ref 8.4–10.5)
Chloride: 108 mEq/L (ref 96–112)
Creatinine, Ser: 0.73 mg/dL (ref 0.40–1.20)
Glucose, Bld: 94 mg/dL (ref 70–99)

## 2010-02-18 LAB — LACTATE DEHYDROGENASE: LDH: 143 U/L (ref 94–250)

## 2010-02-18 LAB — TSH: TSH: 0.053 u[IU]/mL — ABNORMAL LOW (ref 0.350–4.500)

## 2010-02-18 LAB — HEMOGLOBIN A1C
Hgb A1c MFr Bld: 7.5 % — ABNORMAL HIGH (ref <5.7)
Mean Plasma Glucose: 169 mg/dL — ABNORMAL HIGH (ref <117)

## 2010-03-10 IMAGING — CT CT ABDOMEN W/ CM
2 of 5 series · 13 of 36 positions shown, 19 images · IV contrast (READICAT/WATER & [ID] OMNI 300)
Comparison: CT chest 08/10/2008

CT CHEST

CLINICAL DATA: New diagnosis of gastric carcinoma.  Right upper
quadrant pain.

CT CHEST, ABDOMEN AND PELVIS WITH CONTRAST
TECHNIQUE: Multidetector CT imaging of the chest, abdomen and
pelvis was performed following the standard protocol during bolus
administration of intravenous contrast.
Contrast: 125 ml Xmnipaque-SGG IV and oral contrast media.

[Series 601: coronal body · coronal · 1.25mm/px · 1 of 171 slices shown, 2 images]
[im 57/171  soft-tissue]
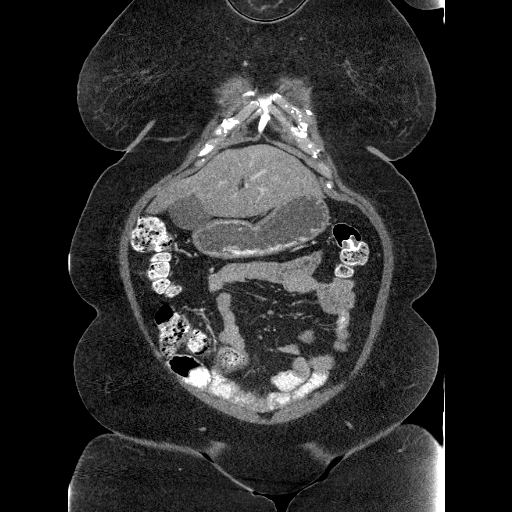
[im 57/171  bone]
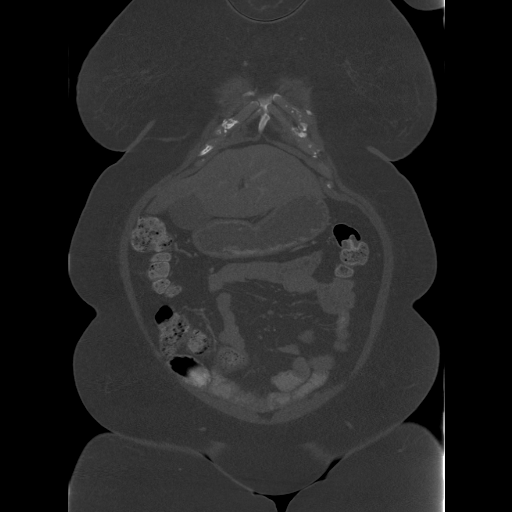

[Series 602: sagittal body · sagittal · 1.25mm/px · 12 of 200 slices shown, 17 images]
[im 15/200  soft-tissue]
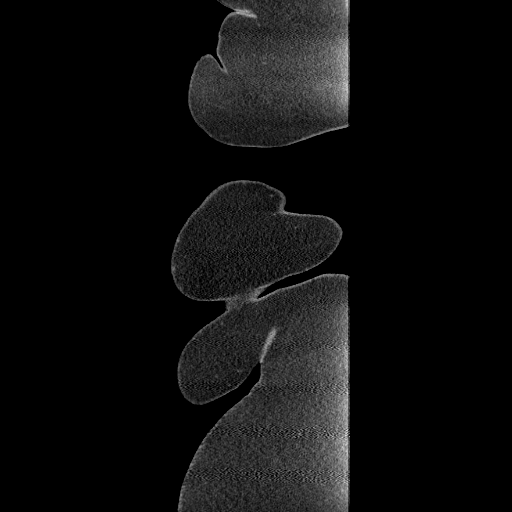
[im 15/200  lung]
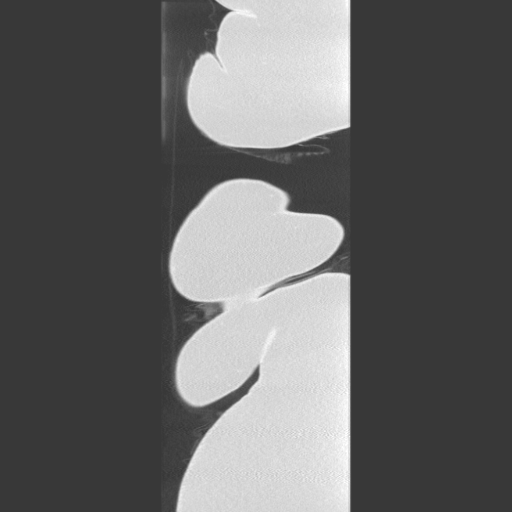
[im 15/200  bone]
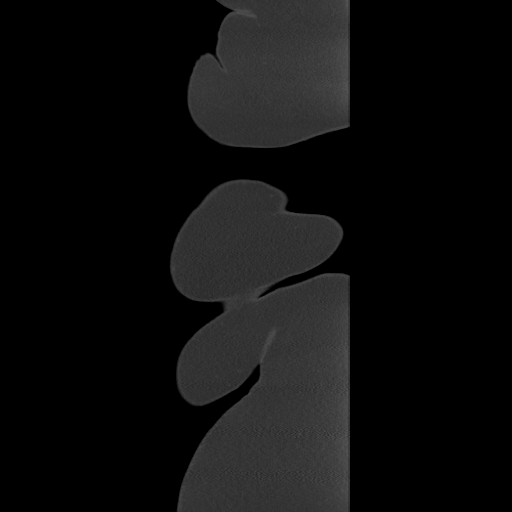
[im 29/200  soft-tissue]
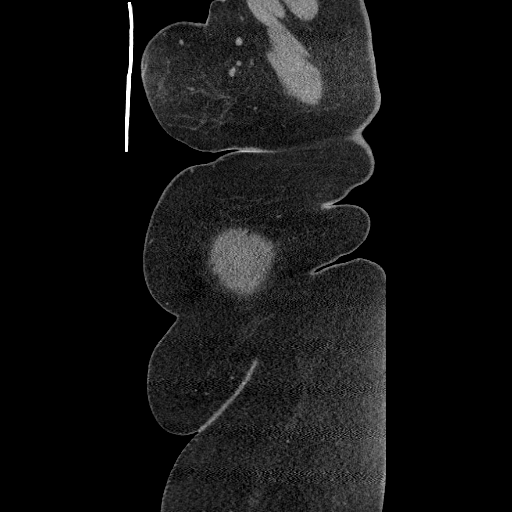
[im 29/200  lung]
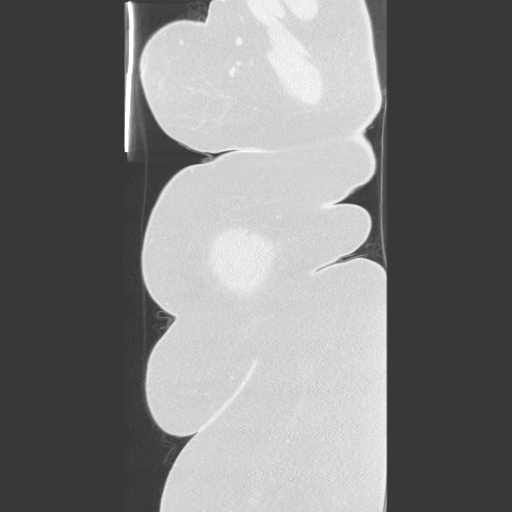
[im 43/200  soft-tissue]
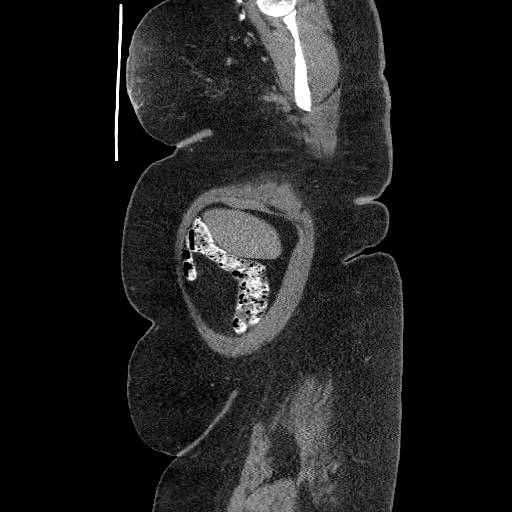
[im 43/200  lung]
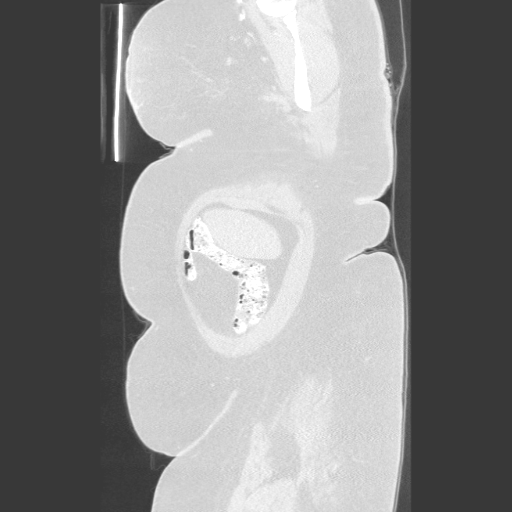
[im 57/200  lung]
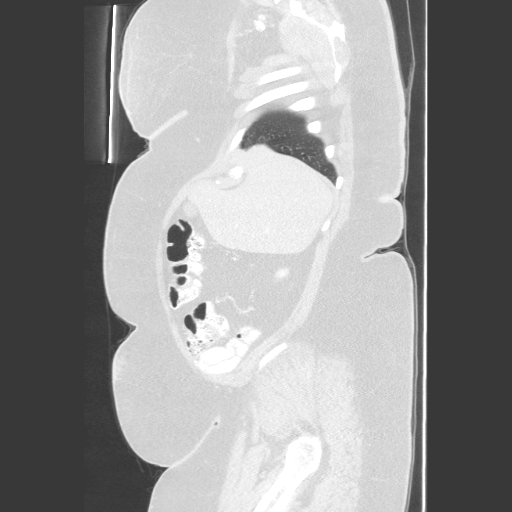
[im 72/200  soft-tissue]
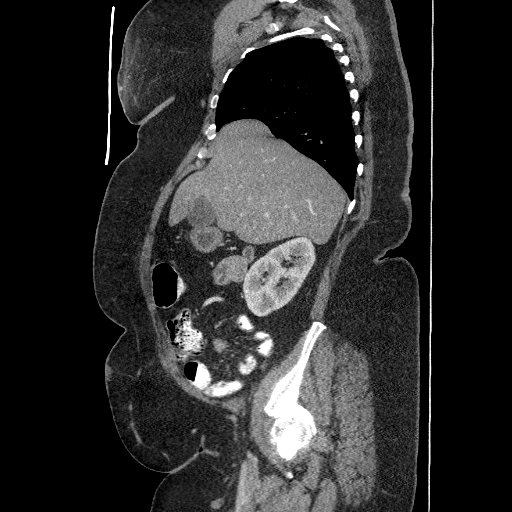
[im 86/200  soft-tissue]
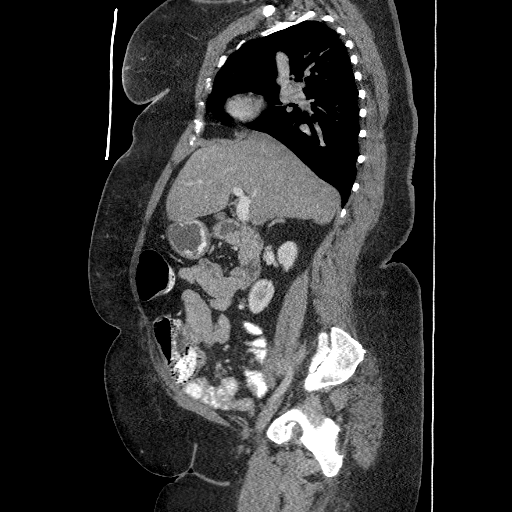
[im 100/200  soft-tissue]
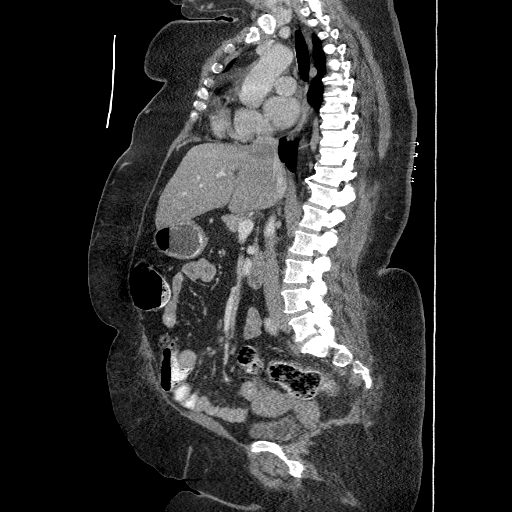
[im 114/200  soft-tissue]
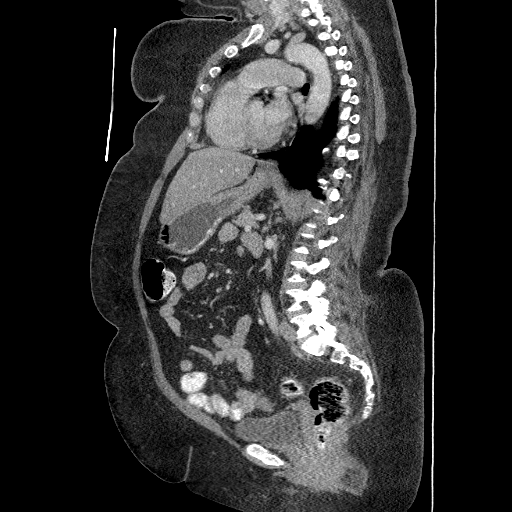
[im 128/200  soft-tissue]
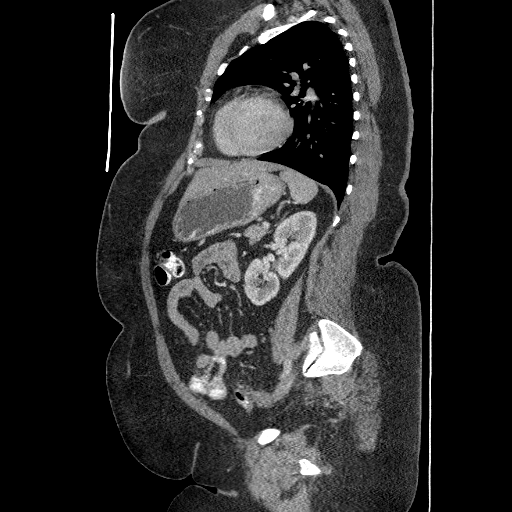
[im 157/200  soft-tissue]
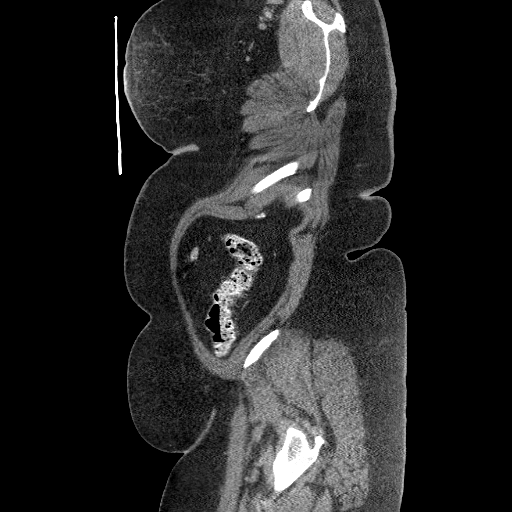
[im 157/200  bone]
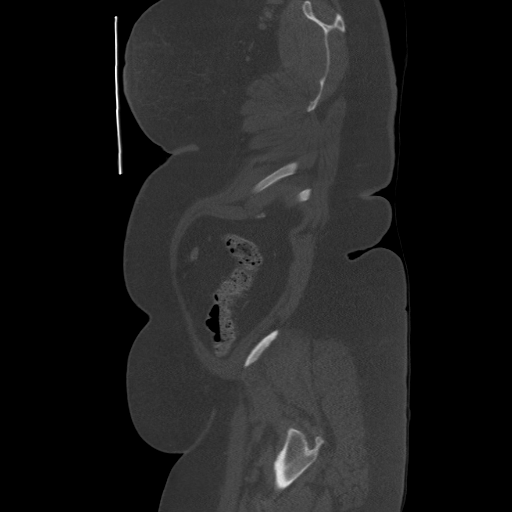
[im 171/200  soft-tissue]
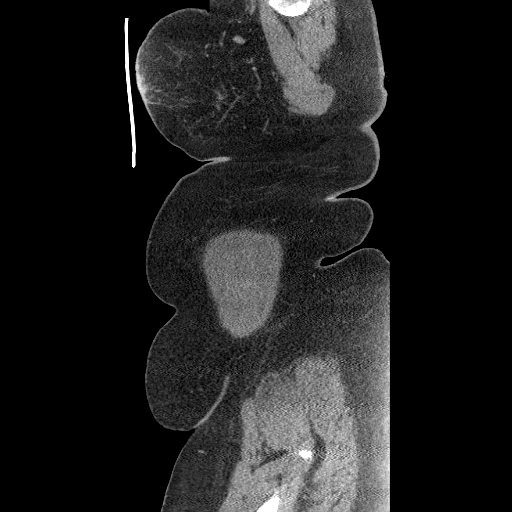
[im 185/200  soft-tissue]
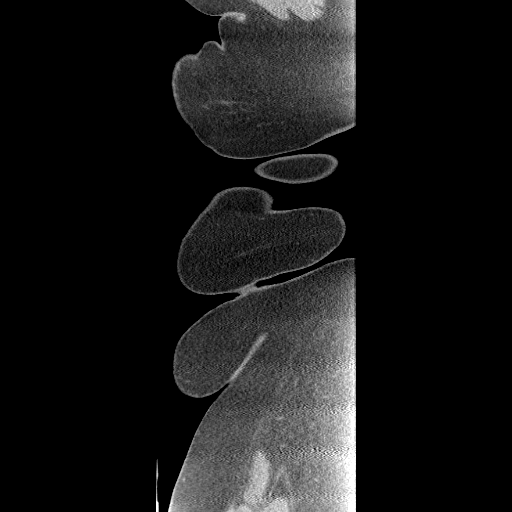

[13 of 36 positions shown; findings below may reference images not displayed]

FINDINGS: Incidentally appreciated are bilateral thyroid nodules.
There is a 1.8 x 2.3 cm low attenuation nodule in the right thyroid
lobe.  There is a 1.5 cm nodule in the left lobe.  No
pathologically enlarged mediastinal or hilar lymph nodes.  Probable
small hiatal hernia.  No pleural abnormality.  No pulmonary nodules
or masses.  Bony thorax unremarkable.
IMPRESSION: Negative for metastatic disease of the chest. Thyroid nodules -
recommend thyroid ultrasound.

CT ABDOMEN
FINDINGS: Mild fatty infiltration of the liver.  No hepatic space-
occupying lesions.  The pancreas, spleen, adrenal glands, and
kidneys appear unremarkable.  No mesenteric or retroperitoneal
abnormality.  There is some postsurgical scarring of the anterior
abdominal wall consistent with previous surgery.  No recurrent
ventral hernia.  No mesenteric or retroperitoneal abnormality.
Reportedly the patient has gastric carcinoma however a gastric mass
is not evident on this exam.  No evidence of perigastric extension
of tumor.  Bony structures unremarkable.
IMPRESSION: Negative for metastatic involvement of the abdomen.  See comments
above.

CT PELVIS
FINDINGS: No pelvic mass or abnormal fluid collection.  Uterus and
adnexal regions plus urinary bladder appear unremarkable.  Bony
pelvis is intact.  Advanced degenerative OA changes of the right
hip.
IMPRESSION: Negative for metastatic involvement of the pelvis.

## 2010-03-24 IMAGING — PT NM PET TUM IMG INITIAL (PI) SKULL BASE T - THIGH
1 of 6 series · 2 of 25 positions shown · non-contrast
Comparison: CT abdomen and pelvis 11/26/2008

CLINICAL DATA: Initial treatment strategy for gastric carcinoma.  

NUCLEAR MEDICINE PET CT INITIAL (PI) SKULL BASE TO THIGH
TECHNIQUE: 16.6 mCi F-18 FDG was injected intravenously via the
right antecubital.  Full-ring PET imaging was performed from the
skull base through the mid-thighs 50  minutes after injection.  CT
data was obtained and used for attenuation correction and anatomic
localization only.  (This was not acquired as a diagnostic CT
examination.)
Fasting Blood Glucose:  153

[Series 2: ct images · axial · 3.8mm · 0.98mm/px · z∈[-654,+0]mm · 2 of 267 slices shown]
[im 67/267  brain]
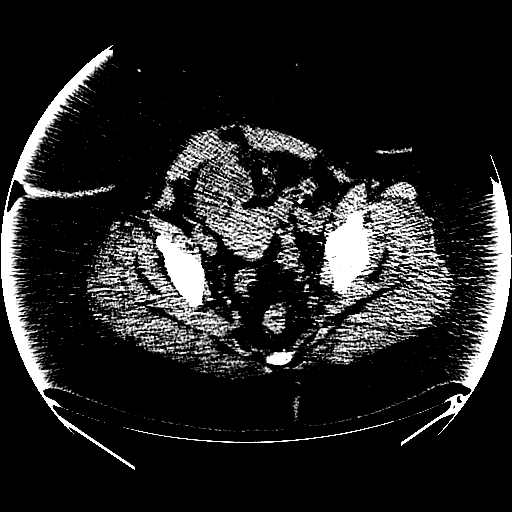
[im 267/267  brain]
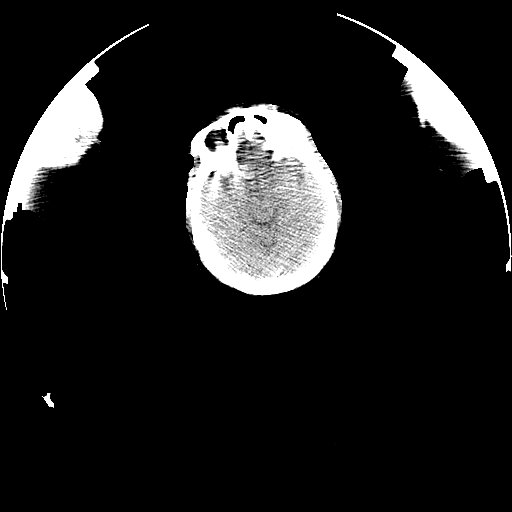

[2 of 25 positions shown; findings below may reference images not displayed]

FINDINGS: There is slight increased uptake along the left lateral
aspect of the fundal region of the stomach without obvious tumor on
the CT examination.  No adjacent adenopathy.  No areas of abnormal
FDG uptake in the neck, chest, abdomen or pelvis to suggest
metastatic disease.
IMPRESSION: 1.  Faint asymmetric increased uptake along the left lateral aspect
of the stomach in the fundal region without obvious mass on the CT
scan.
2.  No findings for metastatic disease.

## 2010-04-08 ENCOUNTER — Ambulatory Visit: Payer: Self-pay | Admitting: Hematology & Oncology

## 2010-04-09 LAB — CBC WITH DIFFERENTIAL (CANCER CENTER ONLY)
BASO#: 0 10*3/uL (ref 0.0–0.2)
EOS%: 6 % (ref 0.0–7.0)
HCT: 37 % (ref 34.8–46.6)
HGB: 11.9 g/dL (ref 11.6–15.9)
LYMPH%: 27.1 % (ref 14.0–48.0)
MCH: 26 pg (ref 26.0–34.0)
MCHC: 32.3 g/dL (ref 32.0–36.0)
MONO%: 8.4 % (ref 0.0–13.0)
NEUT%: 58.1 % (ref 39.6–80.0)

## 2010-04-09 LAB — COMPREHENSIVE METABOLIC PANEL
AST: 15 U/L (ref 0–37)
Alkaline Phosphatase: 88 U/L (ref 39–117)
BUN: 11 mg/dL (ref 6–23)
Calcium: 9.5 mg/dL (ref 8.4–10.5)
Chloride: 107 mEq/L (ref 96–112)
Creatinine, Ser: 0.72 mg/dL (ref 0.40–1.20)

## 2010-04-30 IMAGING — US IR FLUORO GUIDE CV LINE*R*
1 series · 1 of 1 positions shown · non-contrast
Comparison: none

CLINICAL DATA: Early gastric malignancy, preoperative access for
partial gastrectomy and postoperative KRANTHI, morbid obesity

UPPER EXTREMITY PICC PLACEMENT WITH ULTRASOUND AND FLUORO GUIDANCE
TECHNIQUE: The right arm was prepped with chlorhexidine, draped in
the usual sterile fashion using maximum barrier technique and
infiltrated locally with 1% Lidocaine.  Ultrasound demonstrated
patency of the right basilic vein.  Under real-time ultrasound
guidance, this vein was accessed with a 21 gauge micropuncture
needle.  Ultrasound image documentation was performed.  The needle
was exchanged over a guidewire for a peel-away sheath through which
a 5 French double lumen PICC trimmed to 46cm was advanced,
positioned with its tip at the distal SVC/right atrial junction.
Fluoroscopy during the procedure and fluoro spot radiograph
confirms appropriate catheter position.  The catheter was flushed,
secured to the skin with Prolene sutures, and covered with a
sterile dressing.  No immediate complication.
Fluoroscopy Time: 0.7 minutes.

[Series 1: ir fluoro guide cv line*right* · 1 of 1 slices shown]
[im 1/1]
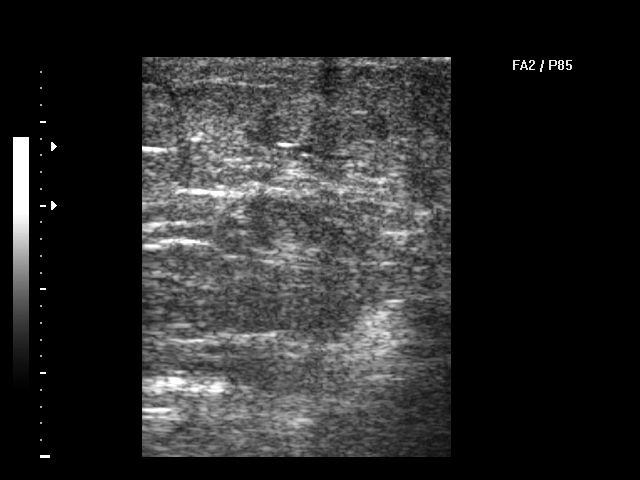

[1 of 1 positions shown; findings below may reference images not displayed]

IMPRESSION: Technically successful right arm PICC placement with ultrasound and
fluoroscopic guidance.  The catheter is ready for use.

## 2010-05-03 IMAGING — CR DG CHEST 1V PORT
1 series · 1 of 1 positions shown · non-contrast
Comparison: 09/30/2008

CLINICAL DATA: Carcinoma of the stomach.  Status post intubation
for respiratory failure.

PORTABLE CHEST - 1 VIEW

[view not recorded]
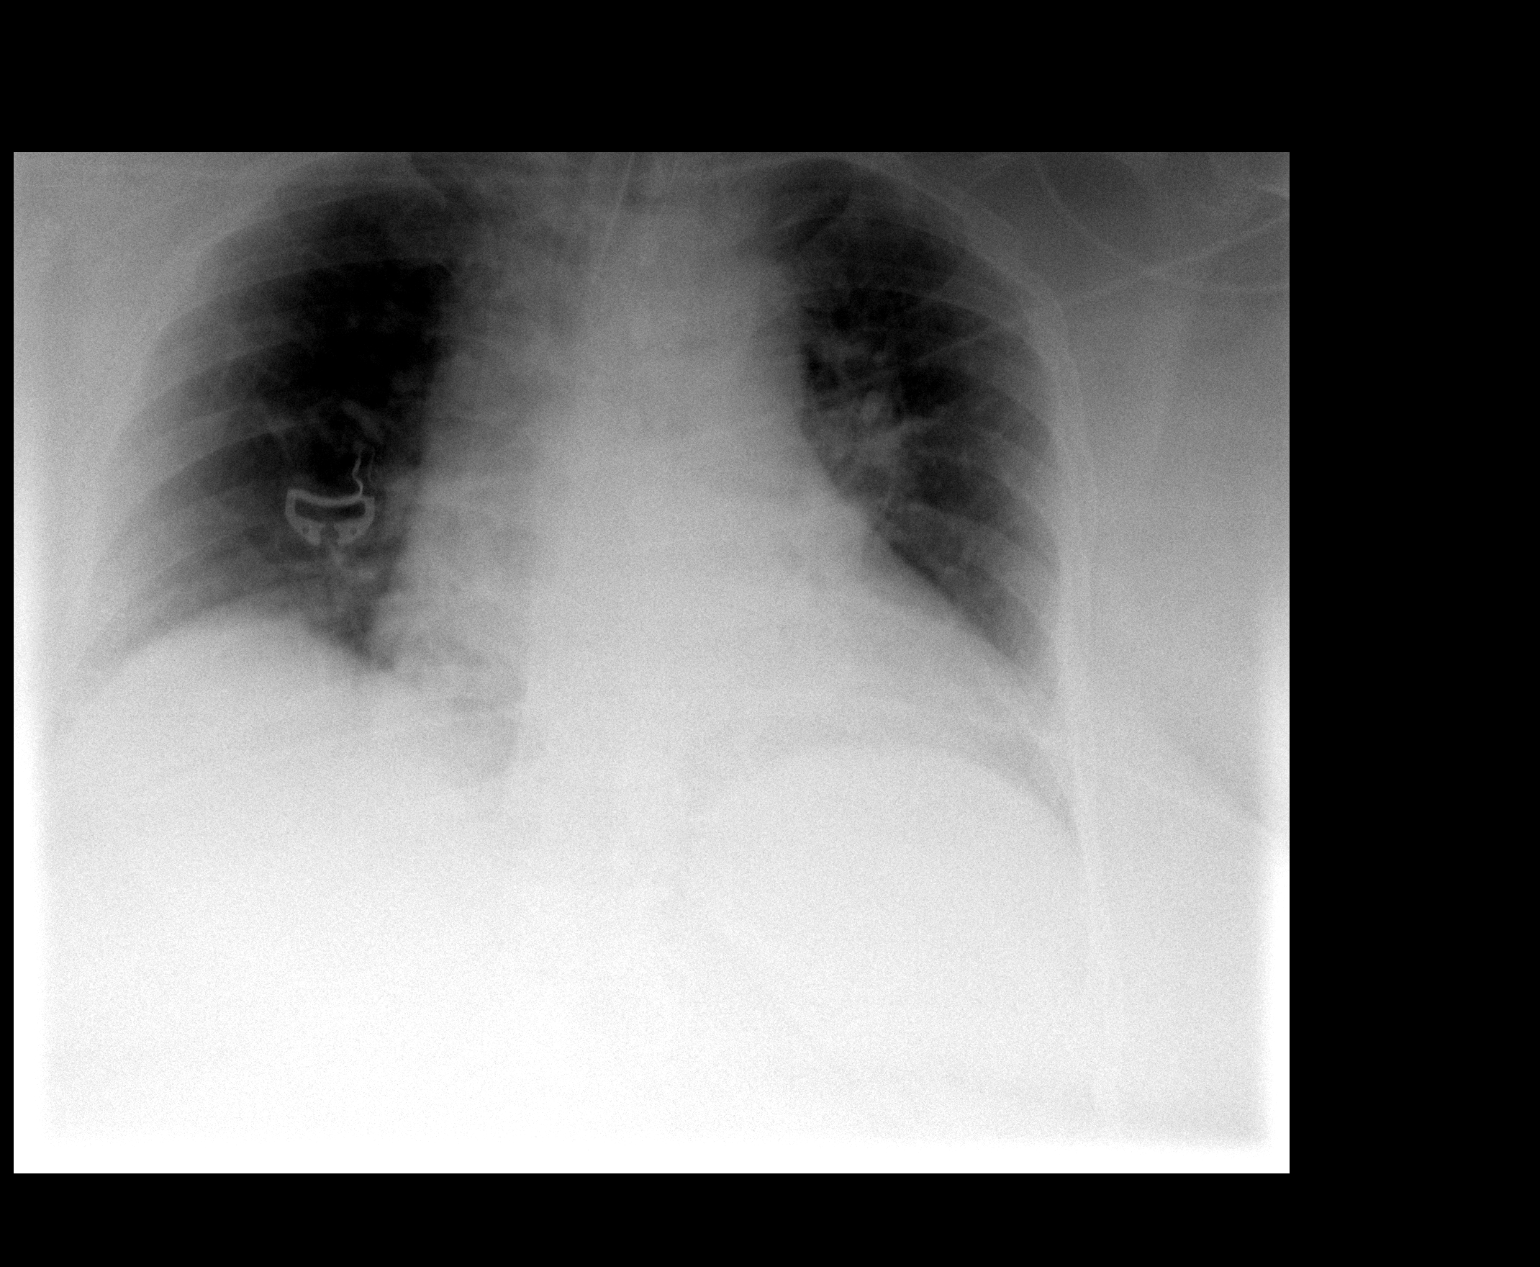

[1 of 1 positions shown; findings below may reference images not displayed]

FINDINGS: Endotracheal tube tip lies approximately 2 cm above the
carina.  There also appears to be a nasogastric tube which is only
partially visualized in the upper chest with the rest
blurred/obscured.  Lung volumes are low with bibasilar atelectasis
present.  Heart is enlarged.  No overt edema or pleural fluid.
IMPRESSION: Endotracheal tube tip lies 2 cm above the carina.  There does
appear to be a second tube representing potentially a nasogastric
tube extending into the esophagus.  This is poorly visualized.

## 2010-05-19 ENCOUNTER — Ambulatory Visit: Payer: Self-pay | Admitting: Hematology & Oncology

## 2010-06-03 IMAGING — US IR FLUORO GUIDE CV LINE*R*
1 series · 1 of 1 positions shown · non-contrast
Comparison: none

Addendum Begins

After port placement, fluoroscopic documentation was obtained.
Addendum Ends
Clinical Data/Indication: Gastric cancer
TUNNEL POWER PORT PLACEMENT WITH SUBCUTANEOUS POCKET UTILIZING
ULTRASOUND & FLOUROSCOPY
Procedure:  After written informed consent was obtained, patient
was placed in the supine position on angiographic table. The right
neck and chest was prepped and draped in a sterile fashion.
Lidocaine was utilized for local anesthesia.  The right internal
jugular vein was noted to be patent initially with ultrasound.
Under sonographic guidance, a micropuncture needle was inserted
into the right IJ vein (Ultrasound image documentation was
performed). The needle was removed over an 018 wire which was
exchanged for a Amplatz.  This was advanced into the IVC.  An 8-
French dilator was advanced over the Amplatz.
A small incision was made in the right upper chest over the
anterior right second rib.  Utilizing blunt dissection, a
subcutaneous pocket was created in the caudal direction. The pocket
was irrigated with a copious amount of sterile normal saline.  The
port catheter was tunneled from the chest incision, and out the
neck incision.  The reservoir was inserted into the subcutaneous
pocket and secured with two 3-0 Ethilon stitches.  A peel-away
sheath was advanced over the Amplatz wire.  The port catheter was
cut to measure length and inserted through the peel-away sheath.
The peel-away sheath was removed.  The chest incision was closed
with 3-0 Vicryl interrupted stitches for the subcutaneous tissue
and a running of 4-0 Vicryl subcuticular stitch for the skin.  The
neck incision was closed with a 4-0 Vicryl subcuticular stitch.
Derma-bond was applied to both surgical incisions.  The port
reservoir was flushed and instilled with heparinized saline.  No
complications.

[Series 1: sp us guide vasc access*right* · 1 of 1 slices shown]
[im 1/1]
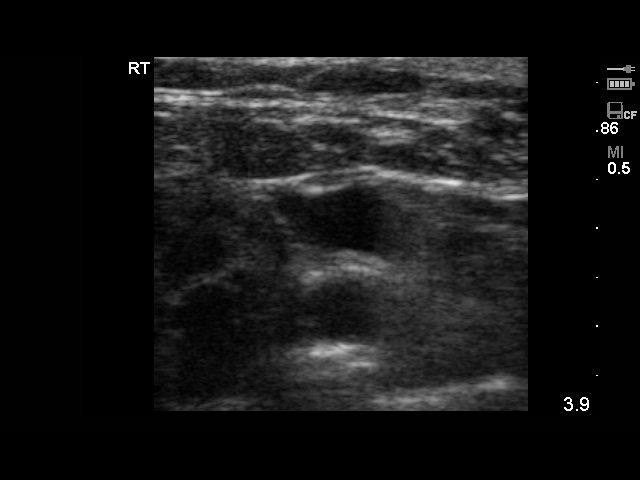

[1 of 1 positions shown; findings below may reference images not displayed]

FINDINGS: A right IJ vein Port-A-Cath is in place with its tip at
the cavoatrial junction.
IMPRESSION: Successful 8 French right internal jugular vein power port
placement with its tip at the SVC/RA junction.

## 2010-06-10 ENCOUNTER — Ambulatory Visit: Payer: Self-pay | Admitting: Oncology

## 2010-06-10 ENCOUNTER — Ambulatory Visit (HOSPITAL_COMMUNITY): Admission: RE | Admit: 2010-06-10 | Discharge: 2010-06-10 | Payer: Self-pay | Admitting: Hematology & Oncology

## 2010-06-10 LAB — COMPREHENSIVE METABOLIC PANEL
CO2: 30 mEq/L (ref 19–32)
Chloride: 107 mEq/L (ref 96–112)
Potassium: 4.1 mEq/L (ref 3.5–5.3)
Sodium: 145 mEq/L (ref 135–145)

## 2010-06-24 ENCOUNTER — Ambulatory Visit: Payer: Self-pay | Admitting: Hematology & Oncology

## 2010-06-25 LAB — CBC WITH DIFFERENTIAL (CANCER CENTER ONLY)
BASO#: 0 10*3/uL (ref 0.0–0.2)
BASO%: 0.4 % (ref 0.0–2.0)
EOS%: 6 % (ref 0.0–7.0)
HGB: 12 g/dL (ref 11.6–15.9)
LYMPH#: 1.8 10*3/uL (ref 0.9–3.3)
MCH: 26.1 pg (ref 26.0–34.0)
MCHC: 32.2 g/dL (ref 32.0–36.0)
MONO%: 9.1 % (ref 0.0–13.0)
NEUT#: 2.4 10*3/uL (ref 1.5–6.5)
NEUT%: 49.2 % (ref 39.6–80.0)
RDW: 14.7 % — ABNORMAL HIGH (ref 10.5–14.6)

## 2010-06-26 LAB — COMPREHENSIVE METABOLIC PANEL
ALT: 12 U/L (ref 0–35)
AST: 18 U/L (ref 0–37)
Alkaline Phosphatase: 94 U/L (ref 39–117)
BUN: 10 mg/dL (ref 6–23)
Creatinine, Ser: 0.77 mg/dL (ref 0.40–1.20)
Potassium: 4.3 mEq/L (ref 3.5–5.3)

## 2010-06-26 LAB — TSH: TSH: 1.925 u[IU]/mL (ref 0.350–4.500)

## 2010-06-26 LAB — HEMOGLOBIN A1C: Mean Plasma Glucose: 192 mg/dL — ABNORMAL HIGH (ref <117)

## 2010-08-13 ENCOUNTER — Other Ambulatory Visit: Payer: Self-pay | Admitting: Hematology & Oncology

## 2010-08-13 DIAGNOSIS — C169 Malignant neoplasm of stomach, unspecified: Secondary | ICD-10-CM

## 2010-08-15 ENCOUNTER — Encounter: Payer: Self-pay | Admitting: Hematology & Oncology

## 2010-08-16 ENCOUNTER — Encounter: Payer: Self-pay | Admitting: General Surgery

## 2010-08-18 ENCOUNTER — Inpatient Hospital Stay (HOSPITAL_COMMUNITY)
Admission: RE | Admit: 2010-08-18 | Discharge: 2010-08-24 | Disposition: A | Discharge status: Home / Self Care | Payer: Self-pay | Source: Home / Self Care | Attending: Orthopedic Surgery | Admitting: Orthopedic Surgery

## 2010-08-18 LAB — CBC
Hemoglobin: 11.7 g/dL — ABNORMAL LOW (ref 12.0–15.0)
Platelets: 292 10*3/uL (ref 150–400)
RBC: 4.5 MIL/uL (ref 3.87–5.11)
WBC: 7 10*3/uL (ref 4.0–10.5)

## 2010-08-18 LAB — GLUCOSE, CAPILLARY
Glucose-Capillary: 137 mg/dL — ABNORMAL HIGH (ref 70–99)
Glucose-Capillary: 173 mg/dL — ABNORMAL HIGH (ref 70–99)

## 2010-08-18 LAB — TYPE AND SCREEN
ABO/RH(D): O POS
Antibody Screen: NEGATIVE

## 2010-08-18 LAB — COMPREHENSIVE METABOLIC PANEL
AST: 16 U/L (ref 0–37)
Albumin: 3.4 g/dL — ABNORMAL LOW (ref 3.5–5.2)
Calcium: 9.4 mg/dL (ref 8.4–10.5)
Creatinine, Ser: 0.75 mg/dL (ref 0.4–1.2)
GFR calc Af Amer: >60 mL/min (ref >60)
Sodium: 145 mEq/L (ref 135–145)
Total Protein: 6.3 g/dL (ref 6.0–8.3)

## 2010-08-18 LAB — APTT: aPTT: 25 seconds (ref 24–37)

## 2010-08-18 LAB — ABO/RH: ABO/RH(D): O POS

## 2010-08-18 LAB — PROTIME-INR: Prothrombin Time: 13.3 seconds (ref 11.6–15.2)

## 2010-08-19 LAB — GLUCOSE, CAPILLARY
Glucose-Capillary: 232 mg/dL — ABNORMAL HIGH (ref 70–99)
Glucose-Capillary: 234 mg/dL — ABNORMAL HIGH (ref 70–99)
Glucose-Capillary: 205 mg/dL — ABNORMAL HIGH (ref 70–99)

## 2010-08-19 LAB — URINALYSIS, ROUTINE W REFLEX MICROSCOPIC
Bilirubin Urine: NEGATIVE
Ketones, ur: NEGATIVE mg/dL
Nitrite: NEGATIVE
Specific Gravity, Urine: 1.015 (ref 1.005–1.030)
Urobilinogen, UA: 0.2 mg/dL (ref 0.0–1.0)

## 2010-08-19 LAB — CBC
HCT: 29.1 % — ABNORMAL LOW (ref 36.0–46.0)
Hemoglobin: 9.6 g/dL — ABNORMAL LOW (ref 12.0–15.0)
MCH: 26.4 pg (ref 26.0–34.0)
MCHC: 33 g/dL (ref 30.0–36.0)
MCV: 79.9 fL (ref 78.0–100.0)

## 2010-08-19 LAB — HEMOGLOBIN A1C
Hgb A1c MFr Bld: 8.2 % — ABNORMAL HIGH (ref <5.7)
Mean Plasma Glucose: 189 mg/dL — ABNORMAL HIGH (ref <117)

## 2010-08-19 LAB — BASIC METABOLIC PANEL
BUN: 13 mg/dL (ref 6–23)
Chloride: 104 mEq/L (ref 96–112)
Potassium: 3.8 mEq/L (ref 3.5–5.1)

## 2010-08-19 LAB — PROTIME-INR: INR: 1.16 (ref 0.00–1.49)

## 2010-08-20 LAB — BASIC METABOLIC PANEL
CO2: 29 mEq/L (ref 19–32)
Calcium: 8.7 mg/dL (ref 8.4–10.5)
Creatinine, Ser: 1.1 mg/dL (ref 0.4–1.2)
GFR calc Af Amer: >60 mL/min (ref >60)
GFR calc non Af Amer: 51 mL/min — ABNORMAL LOW (ref >60)
Sodium: 140 mEq/L (ref 135–145)

## 2010-08-20 LAB — GLUCOSE, CAPILLARY
Glucose-Capillary: 184 mg/dL — ABNORMAL HIGH (ref 70–99)
Glucose-Capillary: 136 mg/dL — ABNORMAL HIGH (ref 70–99)

## 2010-08-20 LAB — CBC
HCT: 26.8 % — ABNORMAL LOW (ref 36.0–46.0)
MCV: 81 fL (ref 78.0–100.0)
Platelets: 226 10*3/uL (ref 150–400)
RBC: 3.31 MIL/uL — ABNORMAL LOW (ref 3.87–5.11)

## 2010-08-20 LAB — PROTIME-INR: INR: 1.32 (ref 0.00–1.49)

## 2010-08-21 LAB — GLUCOSE, CAPILLARY
Glucose-Capillary: 164 mg/dL — ABNORMAL HIGH (ref 70–99)
Glucose-Capillary: 152 mg/dL — ABNORMAL HIGH (ref 70–99)

## 2010-08-21 LAB — BASIC METABOLIC PANEL
BUN: 11 mg/dL (ref 6–23)
Chloride: 103 mEq/L (ref 96–112)
GFR calc Af Amer: >60 mL/min (ref >60)
GFR calc non Af Amer: >60 mL/min (ref >60)
Potassium: 4.1 mEq/L (ref 3.5–5.1)
Sodium: 140 mEq/L (ref 135–145)

## 2010-08-21 LAB — CBC
HCT: 26.7 % — ABNORMAL LOW (ref 36.0–46.0)
Hemoglobin: 8.8 g/dL — ABNORMAL LOW (ref 12.0–15.0)
RBC: 3.33 MIL/uL — ABNORMAL LOW (ref 3.87–5.11)
WBC: 11.2 10*3/uL — ABNORMAL HIGH (ref 4.0–10.5)

## 2010-08-21 LAB — PROTIME-INR
INR: 1.43 (ref 0.00–1.49)
Prothrombin Time: 17.6 seconds — ABNORMAL HIGH (ref 11.6–15.2)

## 2010-08-22 LAB — CBC
HCT: 27.4 % — ABNORMAL LOW (ref 36.0–46.0)
MCH: 26.2 pg (ref 26.0–34.0)
MCV: 80.6 fL (ref 78.0–100.0)
Platelets: 312 10*3/uL (ref 150–400)
RBC: 3.4 MIL/uL — ABNORMAL LOW (ref 3.87–5.11)
RDW: 16.4 % — ABNORMAL HIGH (ref 11.5–15.5)
WBC: 6.9 10*3/uL (ref 4.0–10.5)

## 2010-08-22 LAB — GLUCOSE, CAPILLARY
Glucose-Capillary: 111 mg/dL — ABNORMAL HIGH (ref 70–99)
Glucose-Capillary: 167 mg/dL — ABNORMAL HIGH (ref 70–99)
Glucose-Capillary: 150 mg/dL — ABNORMAL HIGH (ref 70–99)

## 2010-08-22 LAB — PROTIME-INR
INR: 1.4 (ref 0.00–1.49)
Prothrombin Time: 17.4 seconds — ABNORMAL HIGH (ref 11.6–15.2)

## 2010-08-22 LAB — URINE MICROSCOPIC-ADD ON

## 2010-08-22 LAB — URINALYSIS, ROUTINE W REFLEX MICROSCOPIC
Bilirubin Urine: NEGATIVE
Hgb urine dipstick: NEGATIVE
Ketones, ur: NEGATIVE mg/dL
Specific Gravity, Urine: 1.013 (ref 1.005–1.030)
pH: 6 (ref 5.0–8.0)

## 2010-08-23 LAB — PROTIME-INR: INR: 1.57 — ABNORMAL HIGH (ref 0.00–1.49)

## 2010-08-23 LAB — DIFFERENTIAL
Basophils Absolute: 0 10*3/uL (ref 0.0–0.1)
Lymphocytes Relative: 18 % (ref 12–46)
Lymphs Abs: 1.3 10*3/uL (ref 0.7–4.0)
Neutro Abs: 4.8 10*3/uL (ref 1.7–7.7)

## 2010-08-23 LAB — GLUCOSE, CAPILLARY: Glucose-Capillary: 102 mg/dL — ABNORMAL HIGH (ref 70–99)

## 2010-08-24 LAB — URINE CULTURE
Colony Count: >=100000
Culture  Setup Time: 201201291715

## 2010-08-24 LAB — PROTIME-INR
INR: 1.76 — ABNORMAL HIGH (ref 0.00–1.49)
Prothrombin Time: 20.7 seconds — ABNORMAL HIGH (ref 11.6–15.2)

## 2010-08-26 NOTE — Discharge Summary (Signed)
  NAMECYNETHIA, SCHINDLER                  ACCOUNT NO.:  [id number]  MEDICAL RECORD NO.:  [id number]          PATIENT TYPE:  INP  LOCATION:  5020                         FACILITY:  [facility]  PHYSICIAN:  [name], MD     DATE OF BIRTH:  [birth year]  DATE OF ADMISSION:  08/18/2010 DATE OF DISCHARGE:  08/24/2010                              DISCHARGE SUMMARY   FINAL DIAGNOSES: 1. Osteoarthritis, right hip. 2. Body mass index of 44.9. 3. Acute urinary tract infection.  SURGICAL PROCEDURES: 1. Right total hip arthroplasty with Zimmer components 2. Cipro for urinary tract infection.  Arville Go with home health physical therapy and durable medical equipment. Discharged home in stable condition.  PRESCRIPTIONS: 1. Coumadin for DVT prophylaxis. 2. Vicodin and Percocet for pain management. 3. Cipro 500 mg p.o. b.i.d. for 5 days for urinary tract infection.  PLAN:  To follow up in the office in 1 week.  Discharged home in stable condition. [name], MD     MVD/MEDQ  D:  08/24/2010  T:  08/24/2010  Job:  [id number]  Electronically Signed by Meridee Score MD on 08/26/2010 07:34:14 AM

## 2010-08-26 NOTE — Op Note (Signed)
[name], [name]                  ACCOUNT NO.:  [id number]  MEDICAL RECORD NO.:  [id number]          PATIENT TYPE:  INP  LOCATION:  5020                         FACILITY:  [facility]  PHYSICIAN:  [name], MD     DATE OF BIRTH:  [birth year]  DATE OF PROCEDURE:  08/18/2010 DATE OF DISCHARGE:                              OPERATIVE REPORT   PREOPERATIVE DIAGNOSIS:  Osteoarthritis, right hip.  POSTOPERATIVE DIAGNOSIS:  Osteoarthritis, right hip.  PROCEDURE:  Right total hip arthroplasty with Zimmer components, 52-mm acetabulum with the 0-degree poly liner, size 10 femur with a +4 offset and 4+ leg length stem and a 36-mm ceramic head.  SURGEON:  [name], MD  ANESTHESIA:  General.  ESTIMATED BLOOD LOSS:  Minimal.  ANTIBIOTICS:  Clindamycin 600 mg.  DRAINS:  None.  COMPLICATIONS:  None.  TOURNIQUET TIME:  None.  DISPOSITION:  To PACU in stable condition.  Extra time required due to the patient's BMI greater than 50.  HISTORY OF PRESENT ILLNESS:  The patient is a 60 year old woman with osteoarthritis involving her hips and knees.  She is status post a total knee replacement and presents at this time for total hip replacement. Risks and benefits were discussed including infection, neurovascular injury, persistent pain, dislocation, DVT, need for additional surgery. The patient states she understands and wish to proceed at this time. Additional time was required due to the BMI greater than 50.  DESCRIPTION OF PROCEDURE:  The patient was brought to OR room one and underwent a general anesthetic.  After adequate level of anesthesia was obtained, the patient was placed in the left lateral decubitus position with the right side up.  The right lower extremity was prepped using DuraPrep and draped into a sterile field.  Charlie Pitter was used to cover all exposed skin.  A posterolateral incision was made.  This was carried down to the tensor fascia lata, which was split.  The  piriformis, short external rotators, and capsule was incised longitudinally off the femoral neck.  This was tagged with two #2 Ethibond and was retracted. The hip was dislocated.  The femoral neck cut was made 1 cm proximal to the calcar.  Attention was first focused on the acetabular.  The acetabulum was sequentially reamed up to 52 mm.  The 52-mm acetabulum was placed at 45 degrees of abduction and 20 degrees of anteversion.  A 0-degree poly liner was placed.  The wound was irrigated with normal saline throughout the case.  Attention was then focused on the femur. Femur was sequentially broached up to a size 10 and the final size 10 stem was placed.  This was then sequentially trialed with different leg length and different anteversion and the hip was stable with a +4 offset and +4 leg length with 20 degrees of anteversion.  With this, the patient had full flexion, full adduction, internal rotation greater than 70 degrees and the hip was stable.  The hip was then dislocated.  The final neck and head were placed.  Wound was irrigated.  The hip was reduced, again placed through a full range of  motion.  The hip was stable.  The piriformis, short external rotators, and capsule was closed using #2 Ethibond.  The tensor fascia lata was closed using #1 Vicryl, subcu was closed using 0 Vicryl, the skin was closed using approximating staples.  A Mepilex dressing was applied.  The patient was extubated and taken to the PACU in stable condition.     [name], MD     MVD/MEDQ  D:  08/18/2010  T:  08/19/2010  Job:  [id number]  Electronically Signed by Meridee Score MD on 08/26/2010 07:34:10 AM

## 2010-08-27 ENCOUNTER — Other Ambulatory Visit (HOSPITAL_COMMUNITY): Payer: Self-pay

## 2010-09-13 IMAGING — CR DG CHEST 2V
2 series · 2 of 2 positions shown · non-contrast
Comparison: 01/20/2009 and earlier.

CLINICAL DATA: 58-year-old female with nausea, vomiting.  History
of gastric carcinoma.  Query pneumonia.

CHEST - 2 VIEW

[w chest pa]
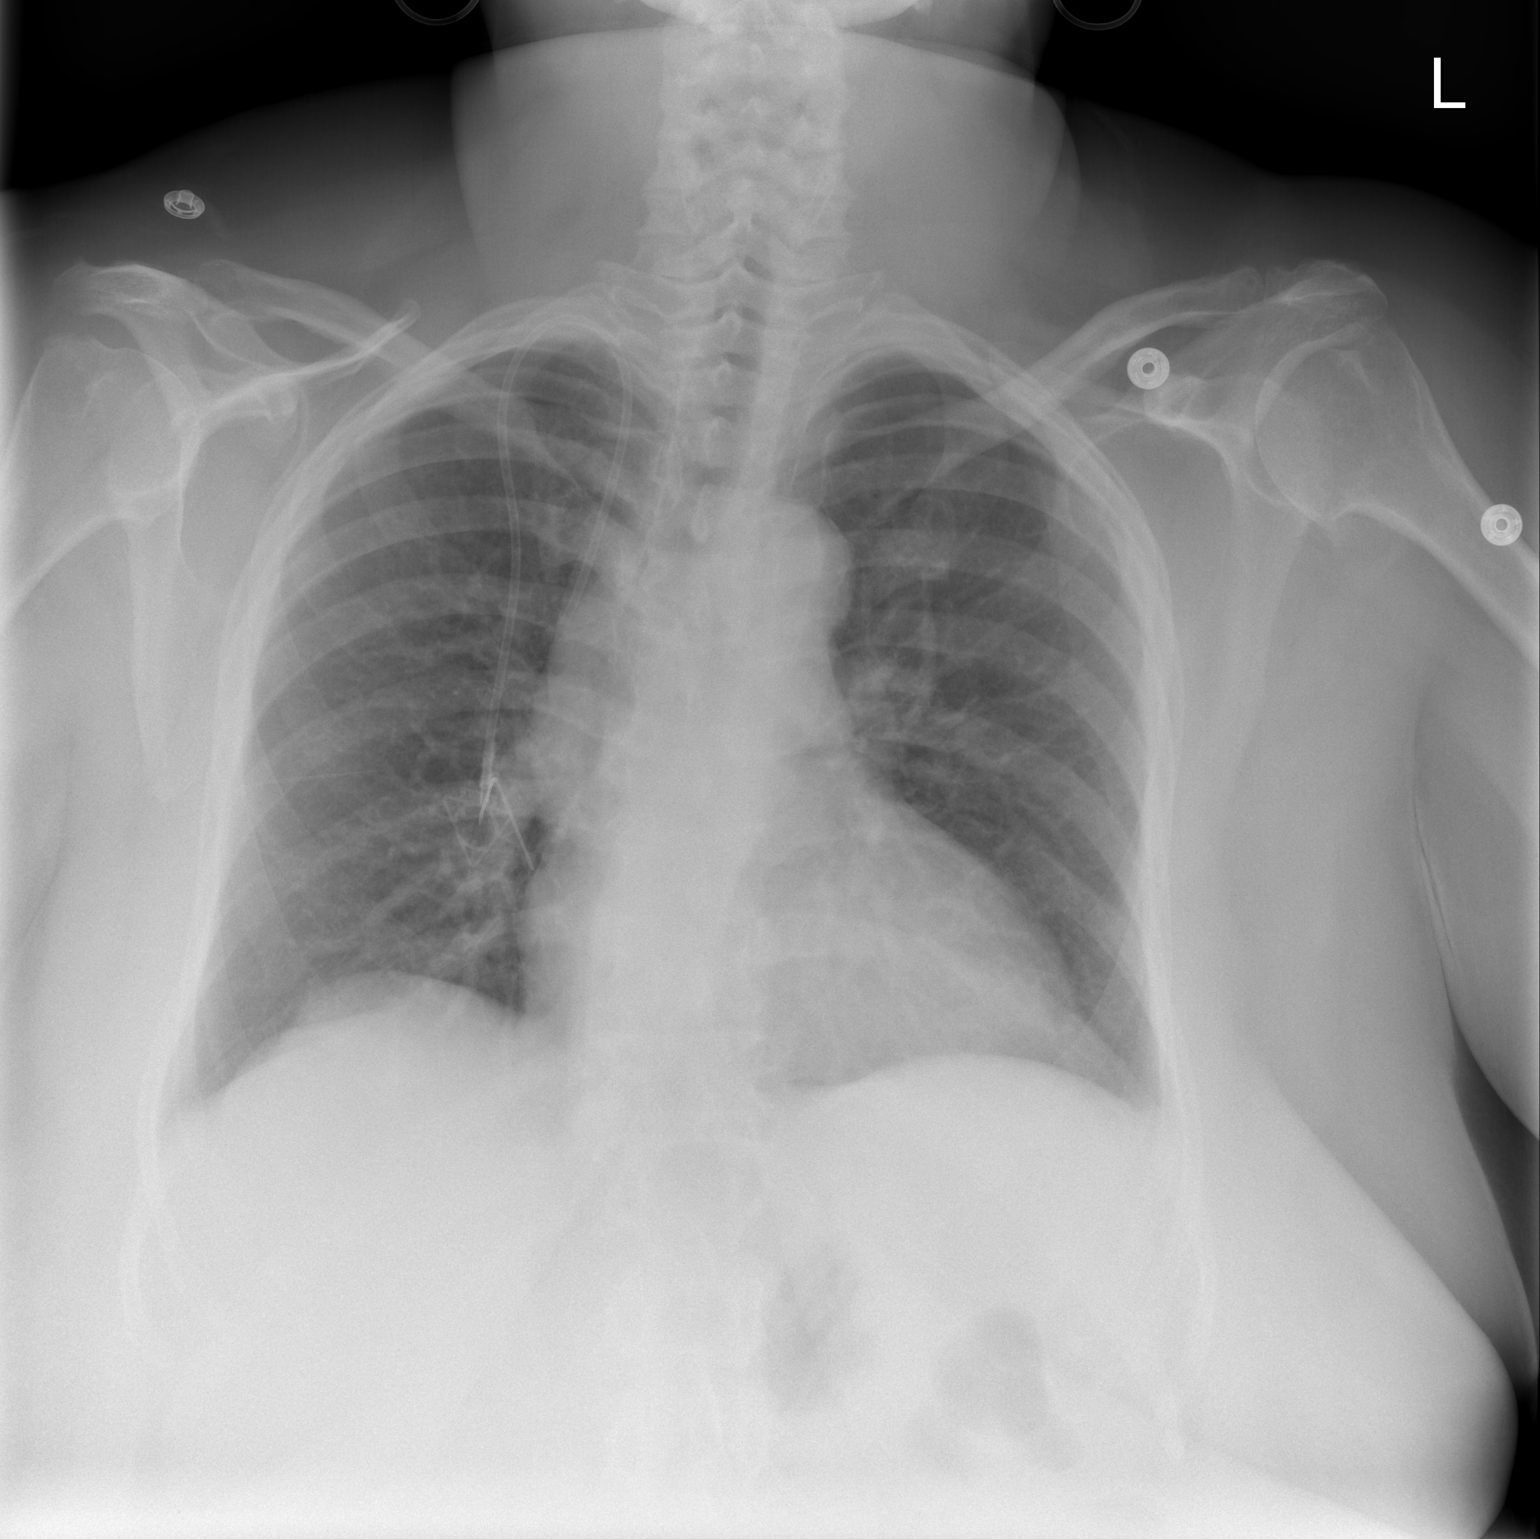

[w chest lat]
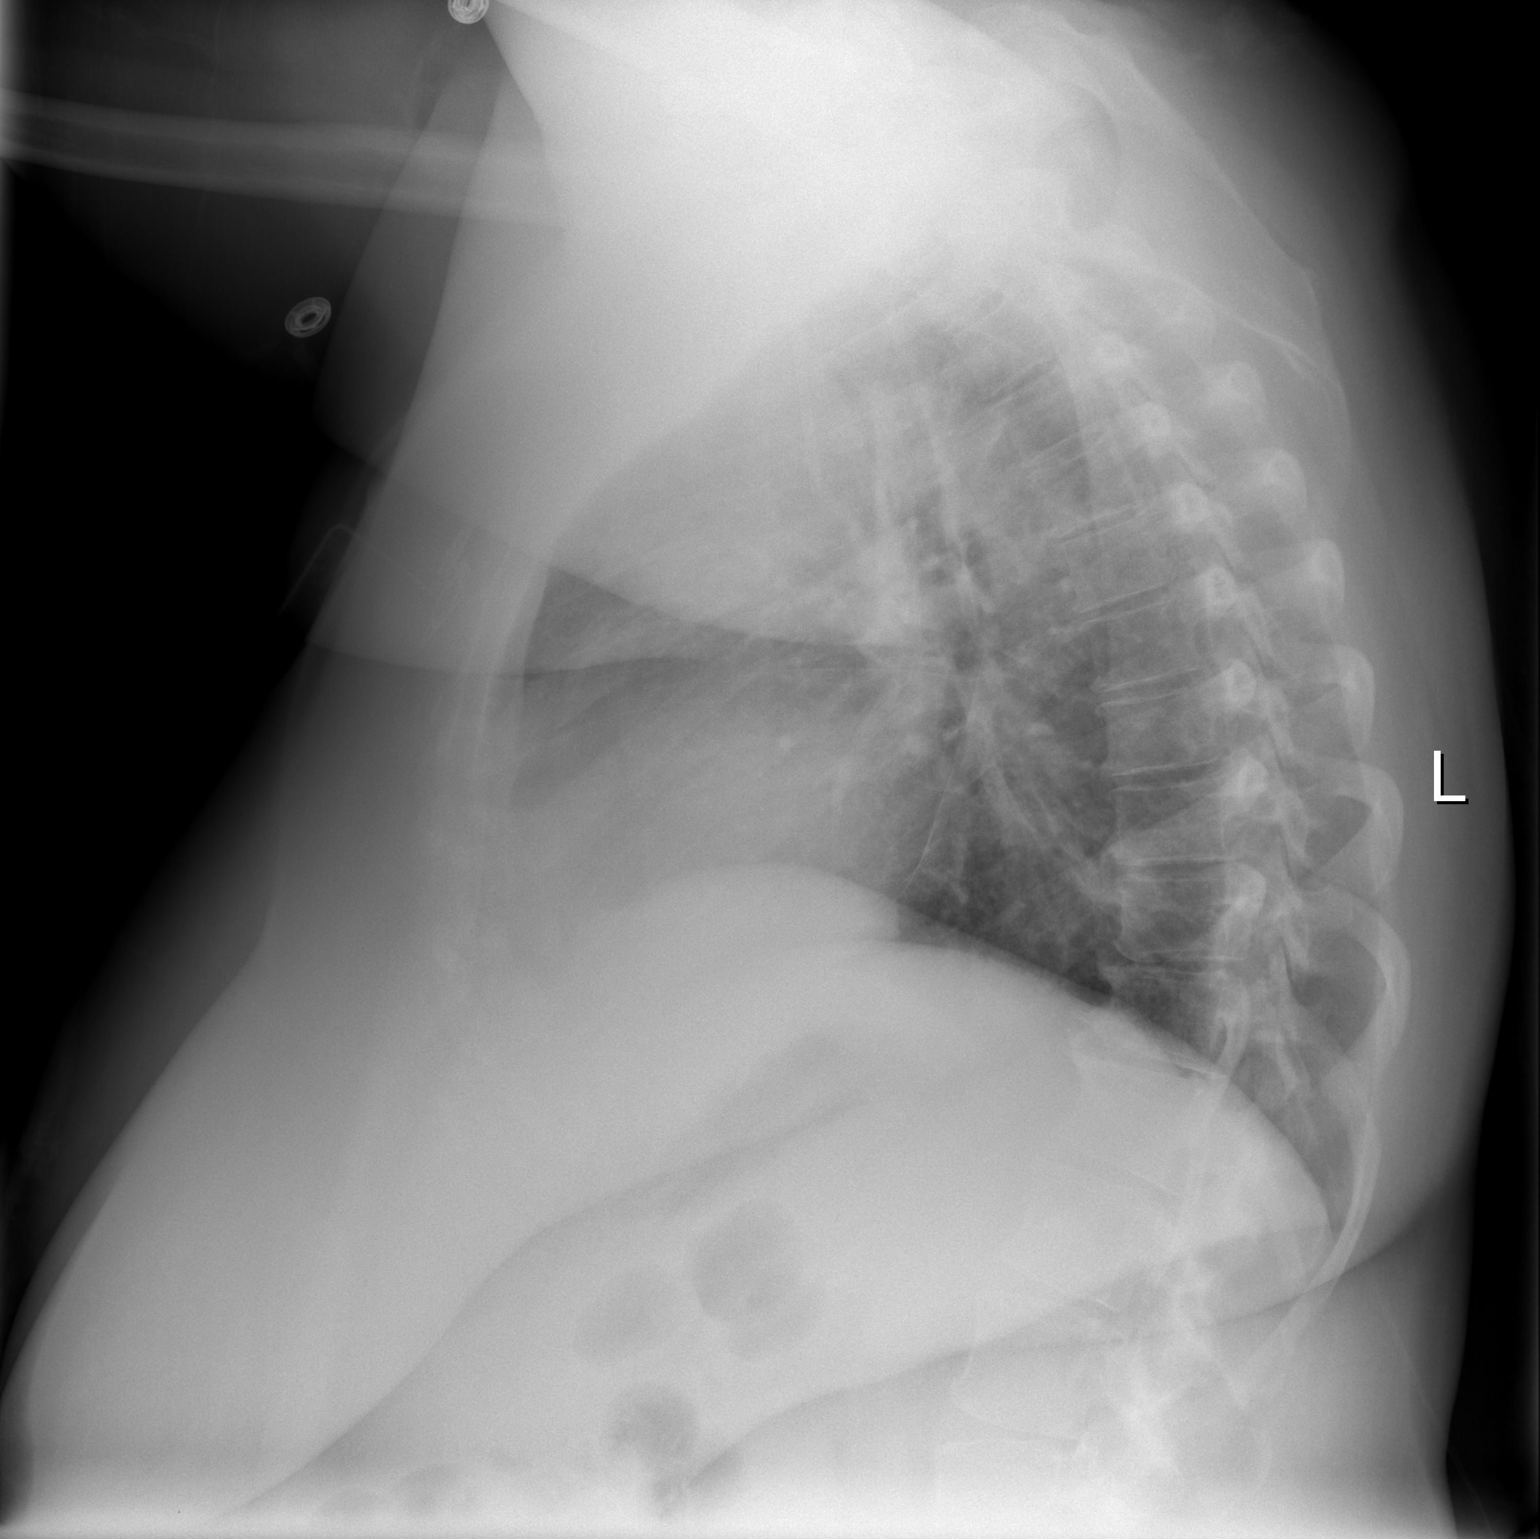

[2 of 2 positions shown; findings below may reference images not displayed]

FINDINGS: Right chest Port-A-Cath.  Shallow lung volumes.  No
pleural effusion evident, mild blunting of left costophrenic sulcus
may be due to scarring.  Cardiac size and mediastinal contours are
within normal limits.  No pneumothorax, pulmonary edema, or focal
airspace disease.  Degenerative changes in the spine. No acute
osseous abnormality identified.
IMPRESSION: No pleural effusion. Low lung volumes, otherwise no acute
cardiopulmonary abnormality.

## 2010-09-13 IMAGING — CR DG ABDOMEN 2V
3 series · 3 of 3 positions shown · non-contrast
Comparison: CT abdomen pelvis 11/26/2008.

CLINICAL DATA: 58-year-old female with nausea and vomiting.
Possible small bowel obstruction.  Shortness of breath. History of
gastric carcinoma.

ABDOMEN - 2 VIEW

[w abdomen upright *]
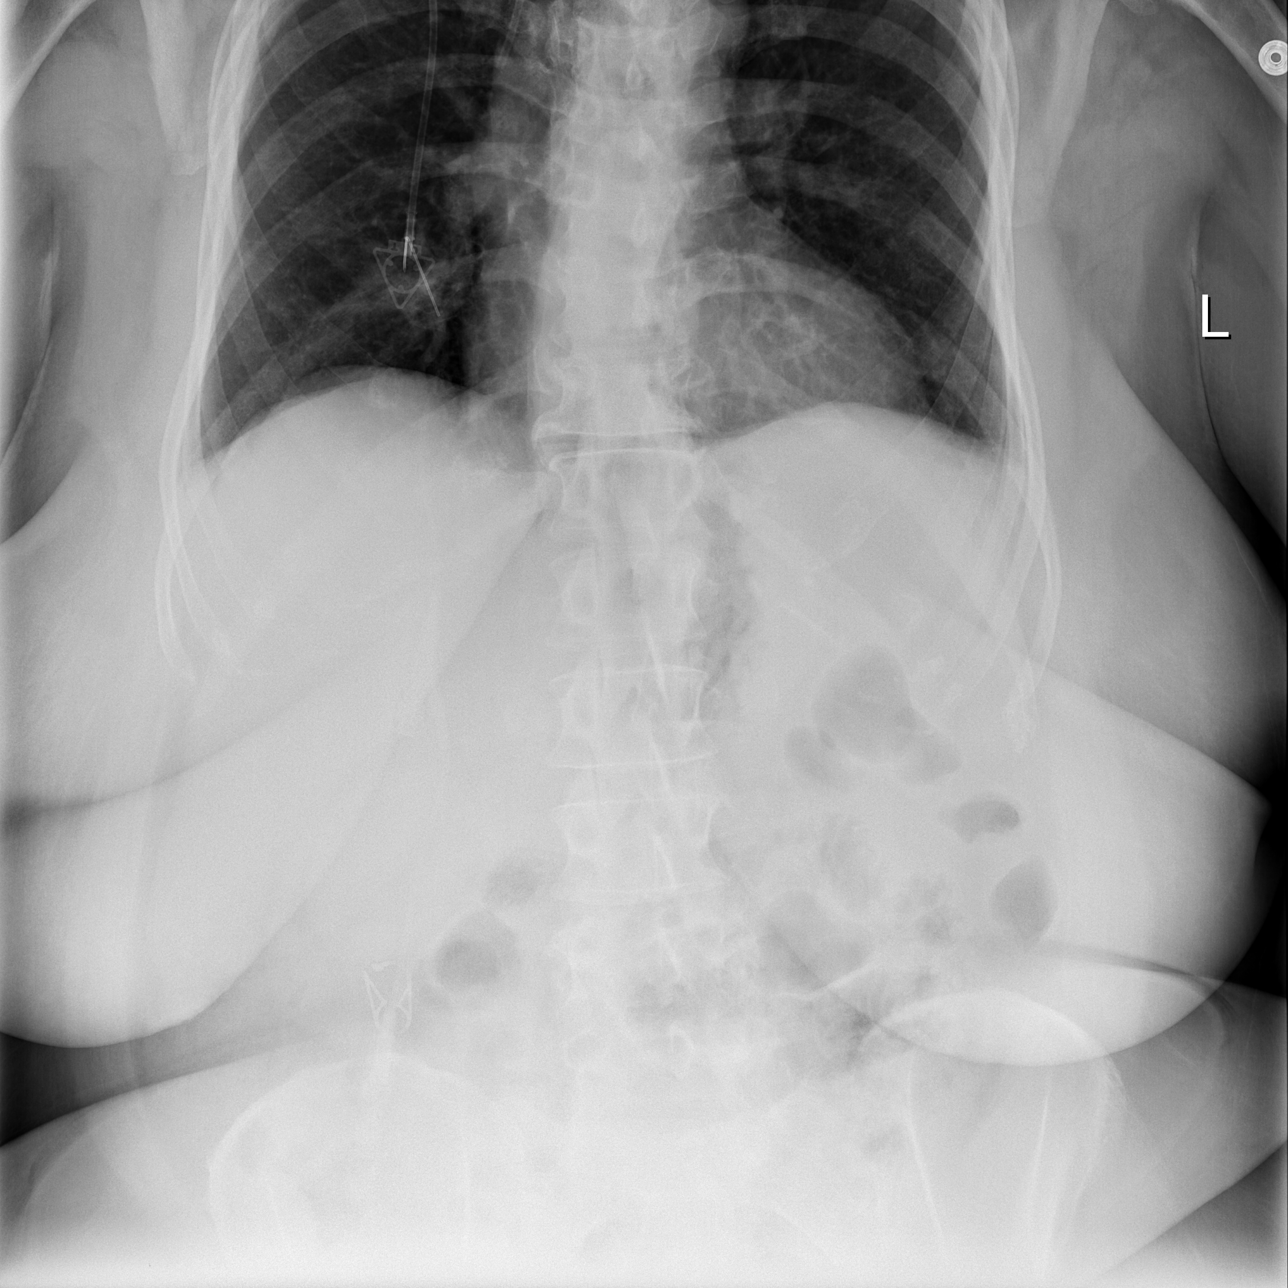

[t abdomen supine (1 of 2)]
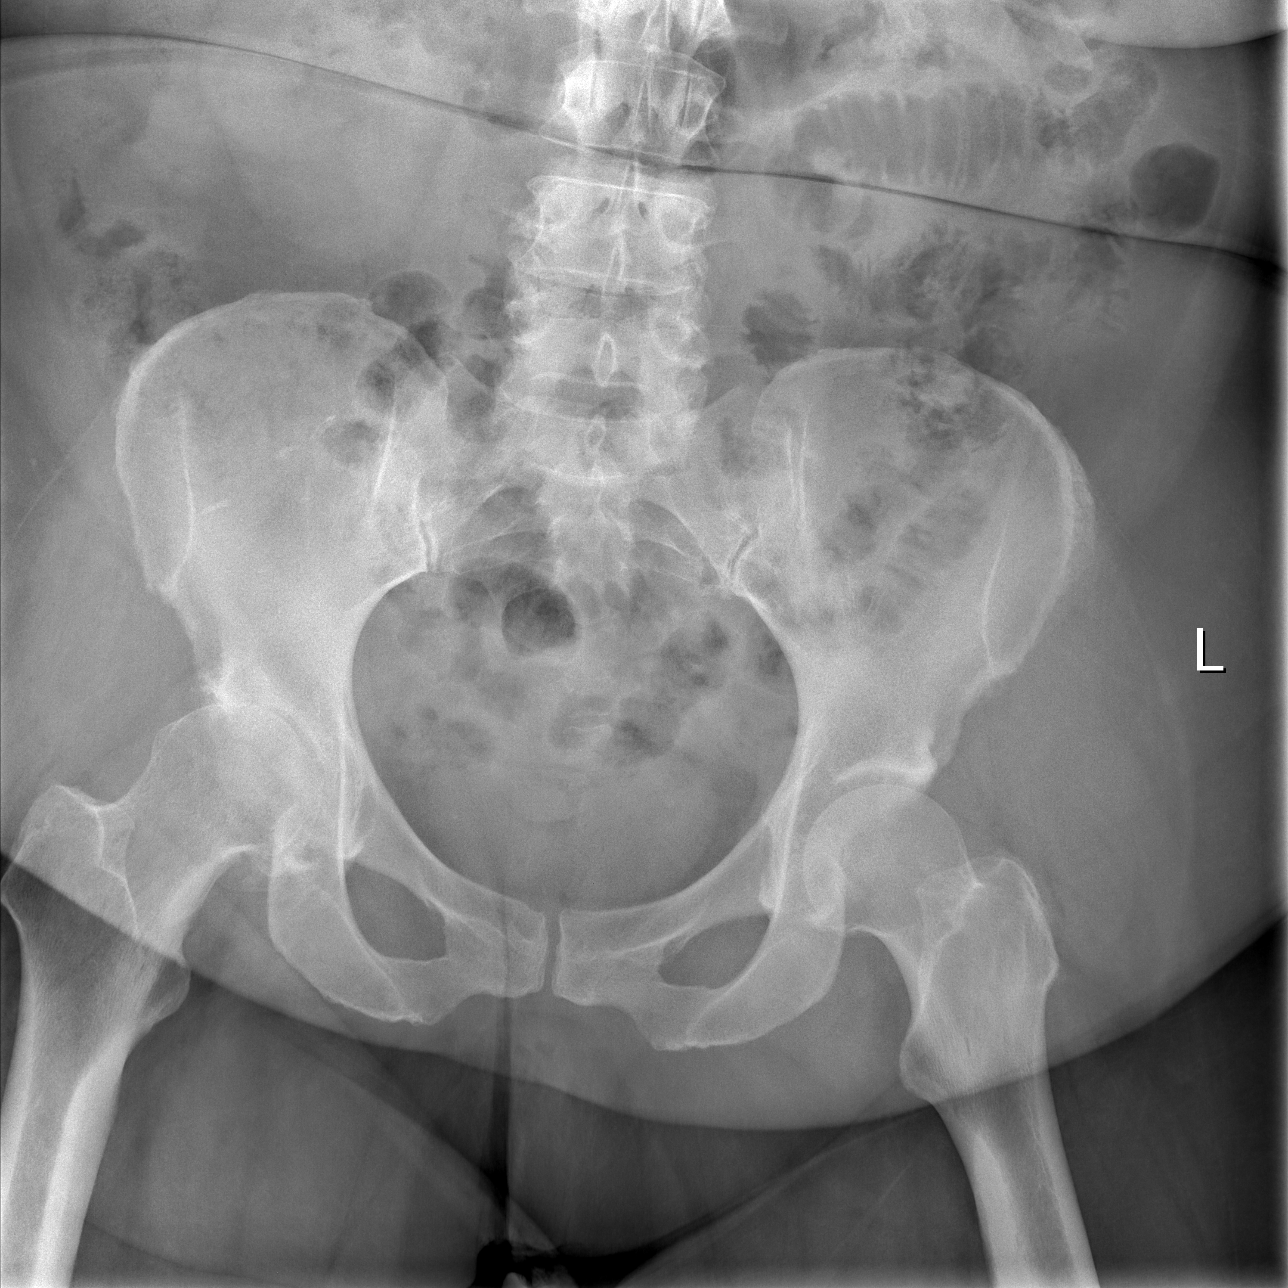

[t abdomen supine (2 of 2)]
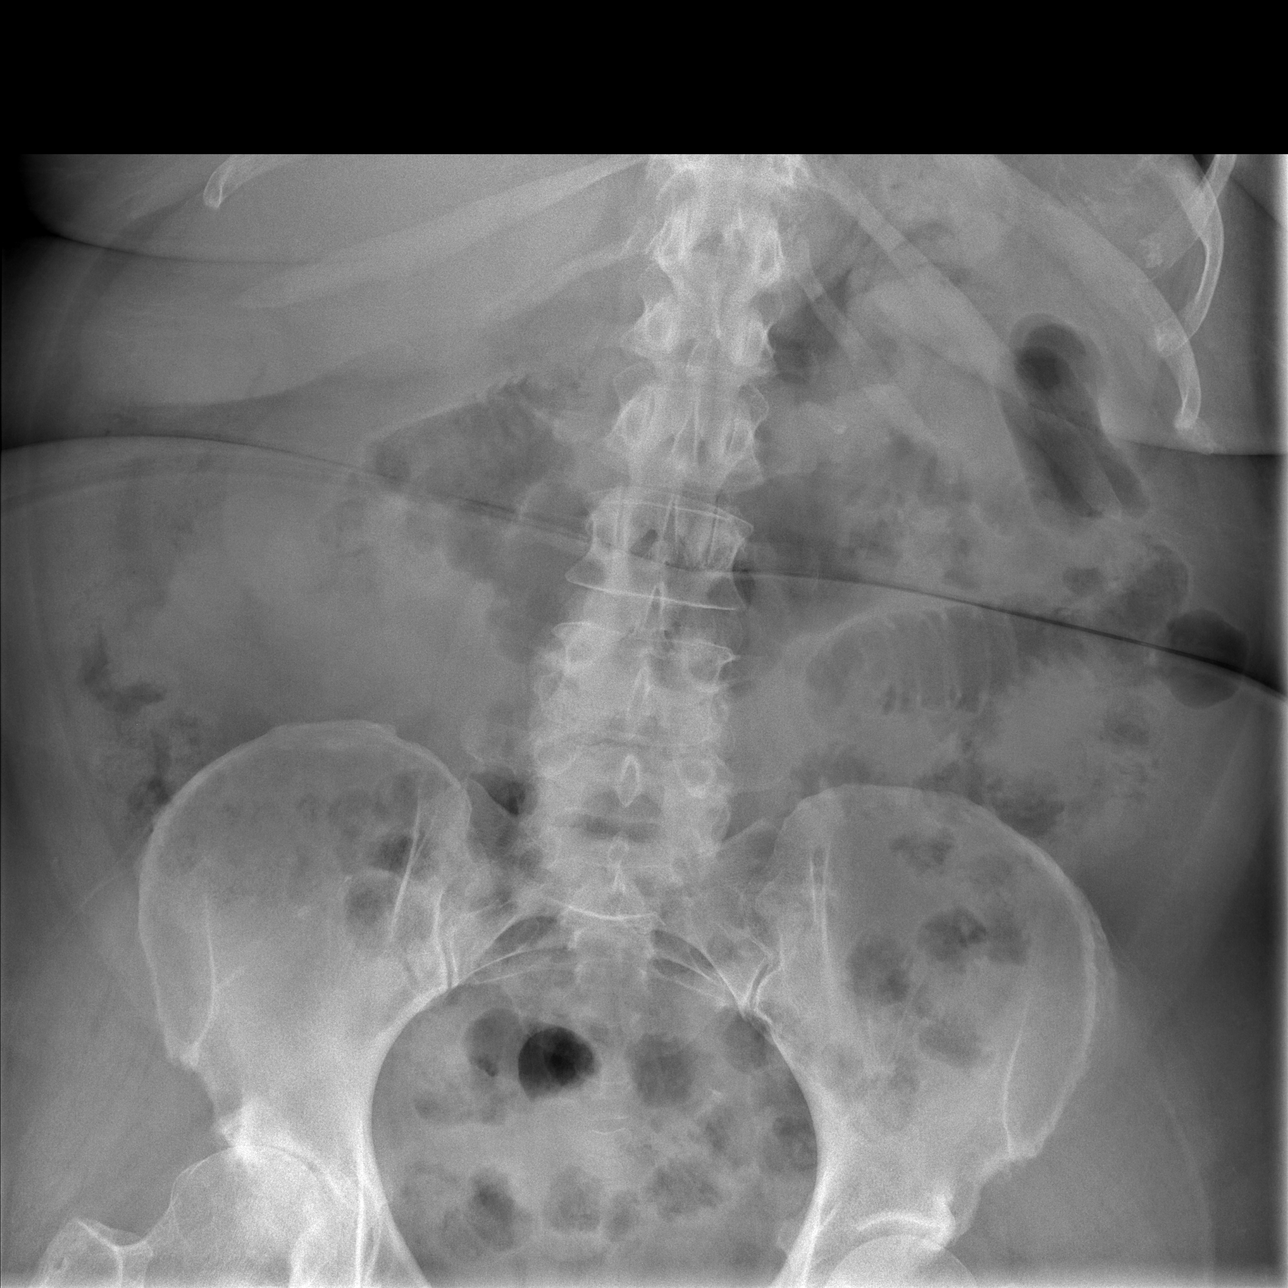

[3 of 3 positions shown; findings below may reference images not displayed]

FINDINGS: Chest partially visualized.  Right chest Port-A-Cath.  No
pneumoperitoneum.  Cannot exclude small pleural effusions.  There
is an increased amount of small bowel gas, but no abnormally
dilated loops.  There are thickened small bowel loops seen in the
left abdomen.  Visualized colonic gas is unremarkable.  Chronic
degenerative changes at the right hip. No acute osseous abnormality
identified.
IMPRESSION: 1. Nonobstructed bowel gas pattern, no free air.
2.  Suggestion of small bowel wall thickening in the left abdomen.
Inflammatory or metastatic peritoneal process cannot be excluded.
3.  Cannot exclude small pleural effusions.

## 2010-09-24 ENCOUNTER — Encounter (HOSPITAL_BASED_OUTPATIENT_CLINIC_OR_DEPARTMENT_OTHER): Payer: Medicare Other | Admitting: Hematology & Oncology

## 2010-09-24 ENCOUNTER — Other Ambulatory Visit: Payer: Self-pay | Admitting: Hematology & Oncology

## 2010-09-24 DIAGNOSIS — I10 Essential (primary) hypertension: Secondary | ICD-10-CM

## 2010-09-24 DIAGNOSIS — Z452 Encounter for adjustment and management of vascular access device: Secondary | ICD-10-CM

## 2010-09-24 DIAGNOSIS — C169 Malignant neoplasm of stomach, unspecified: Secondary | ICD-10-CM

## 2010-09-24 DIAGNOSIS — R718 Other abnormality of red blood cells: Secondary | ICD-10-CM

## 2010-09-24 DIAGNOSIS — Z5111 Encounter for antineoplastic chemotherapy: Secondary | ICD-10-CM

## 2010-09-24 DIAGNOSIS — C165 Malignant neoplasm of lesser curvature of stomach, unspecified: Secondary | ICD-10-CM

## 2010-09-24 DIAGNOSIS — E119 Type 2 diabetes mellitus without complications: Secondary | ICD-10-CM

## 2010-09-24 LAB — CBC WITH DIFFERENTIAL (CANCER CENTER ONLY)
BASO%: 0.4 % (ref 0.0–2.0)
EOS%: 3.9 % (ref 0.0–7.0)
LYMPH#: 1.9 10*3/uL (ref 0.9–3.3)
MCH: 26 pg (ref 26.0–34.0)
MCHC: 32.7 g/dL (ref 32.0–36.0)
MONO%: 10.6 % (ref 0.0–13.0)
NEUT#: 4.7 10*3/uL (ref 1.5–6.5)
Platelets: 421 10*3/uL — ABNORMAL HIGH (ref 145–400)

## 2010-09-29 LAB — COMPREHENSIVE METABOLIC PANEL
AST: 14 U/L (ref 0–37)
Albumin: 3.7 g/dL (ref 3.5–5.2)
BUN: 14 mg/dL (ref 6–23)
Calcium: 8.5 mg/dL (ref 8.4–10.5)
Chloride: 105 mEq/L (ref 96–112)
Creatinine, Ser: 0.83 mg/dL (ref 0.40–1.20)
Glucose, Bld: 151 mg/dL — ABNORMAL HIGH (ref 70–99)
Potassium: 3.7 mEq/L (ref 3.5–5.3)

## 2010-09-29 LAB — LACTATE DEHYDROGENASE: LDH: 211 U/L (ref 94–250)

## 2010-09-29 LAB — HEMOGLOBIN A1C
Hgb A1c MFr Bld: 8.1 % — ABNORMAL HIGH (ref <5.7)
Mean Plasma Glucose: 186 mg/dL — ABNORMAL HIGH (ref <117)

## 2010-09-29 LAB — IRON AND TIBC: UIBC: 287 ug/dL

## 2010-09-29 LAB — FERRITIN: Ferritin: 31 ng/mL (ref 10–291)

## 2010-10-06 IMAGING — US US EXTREM  UP VENOUS*R*
1 series · 14 of 24 positions shown · non-contrast
Comparison: None.

CLINICAL DATA: Right neck stiffness, swelling.  History of gastric
cancer.  Port-A-Cath.

RIGHT UPPER EXTREMITY VENOUS DUPLEX ULTRASOUND
TECHNIQUE: Gray-scale sonography with graded compression, as well
as color Doppler and duplex ultrasound were performed to evaluate
the right upper extremity deep venous system from the level of the
subclavian vein and including the jugular, axillary, basilic and
upper cephalic vein.  Spectral Doppler was utilized to evaluate
flow at rest and with distal augmentation maneuvers.

[Series 1: us extrem up venous*right* · 14 of 36 slices shown]
[im 1/36]
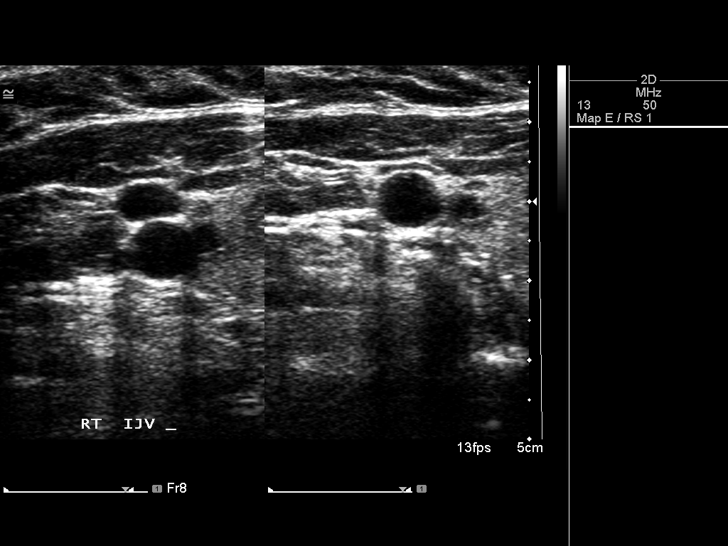
[im 4/36]
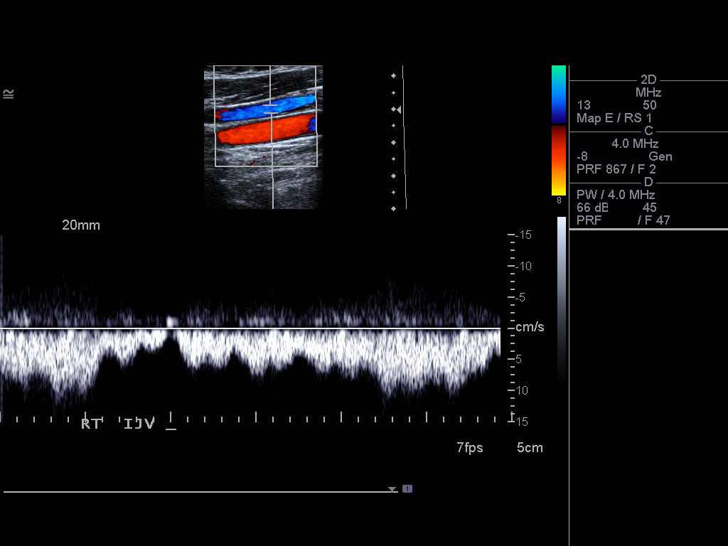
[im 7/36]
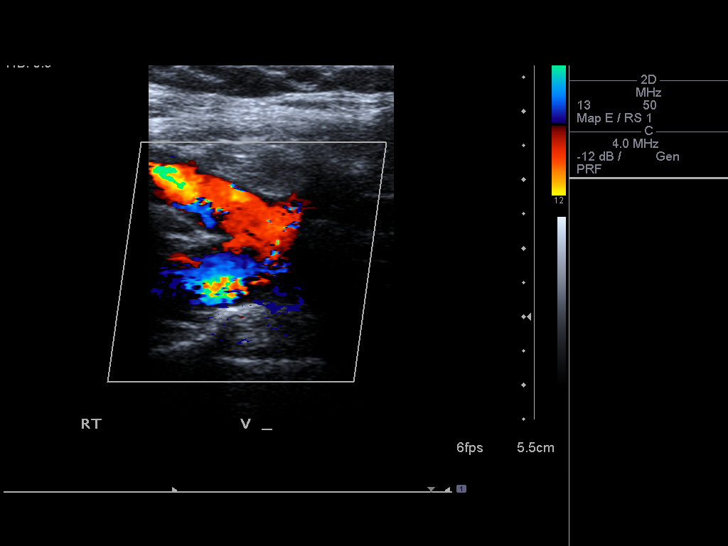
[im 10/36]
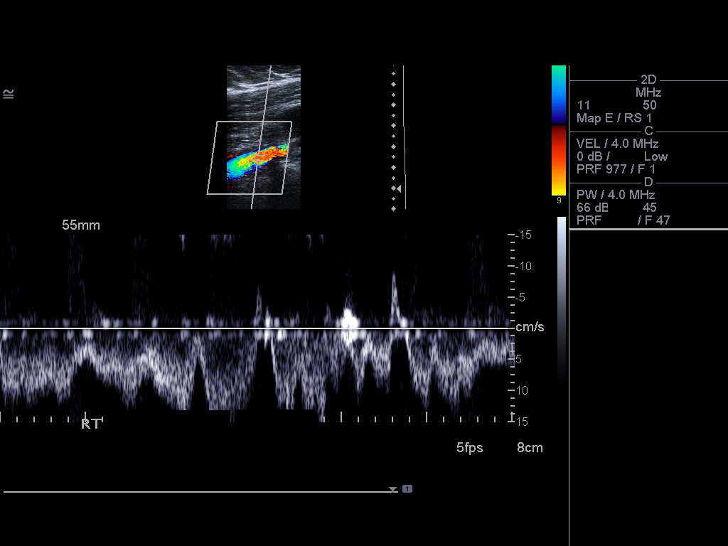
[im 11/36]
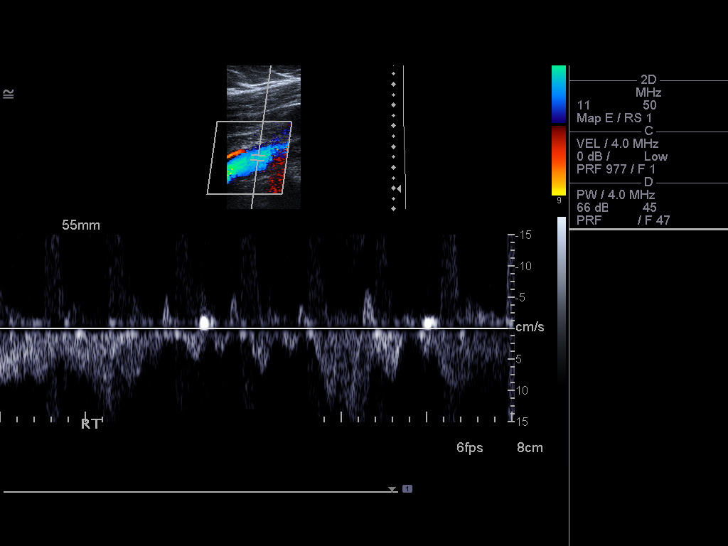
[im 14/36]
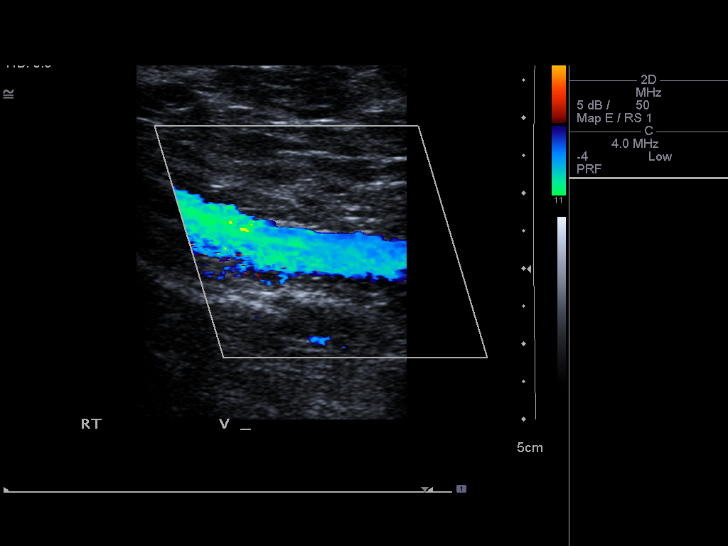
[im 17/36]
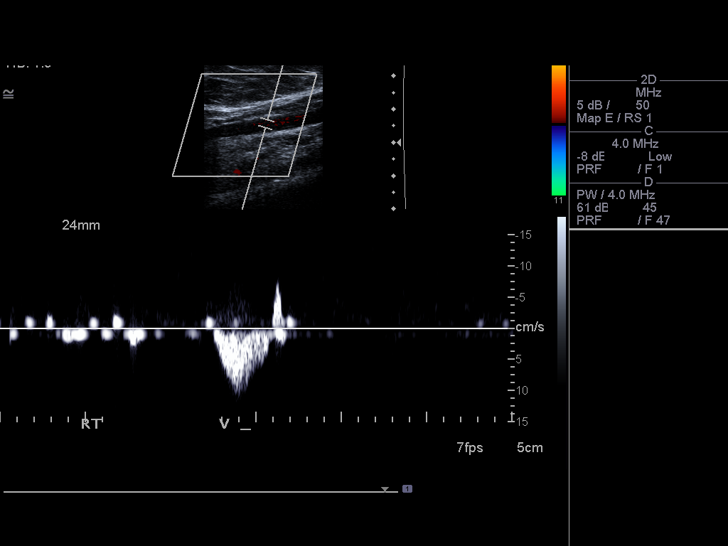
[im 19/36]
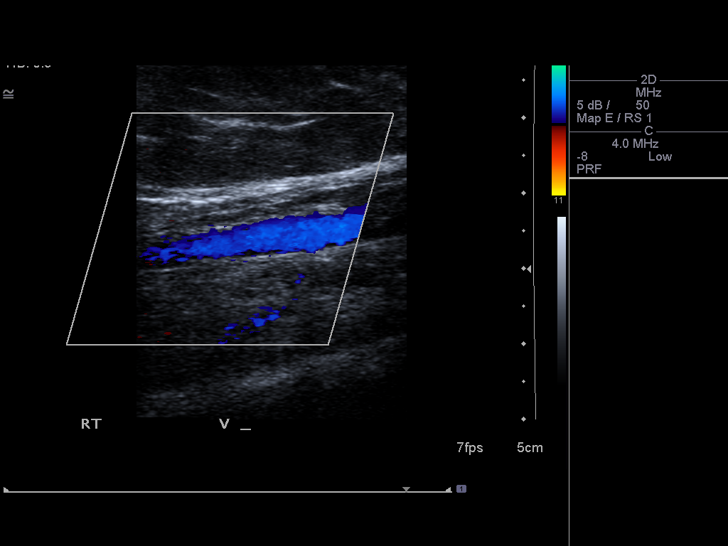
[im 22/36]
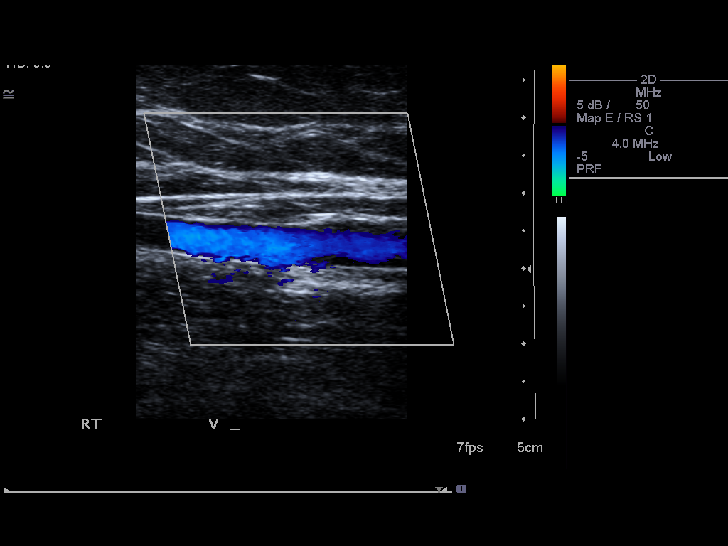
[im 25/36]
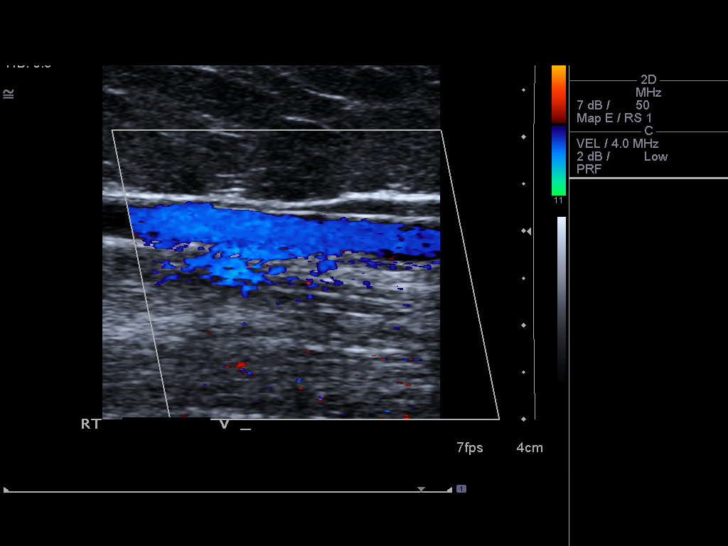
[im 28/36]
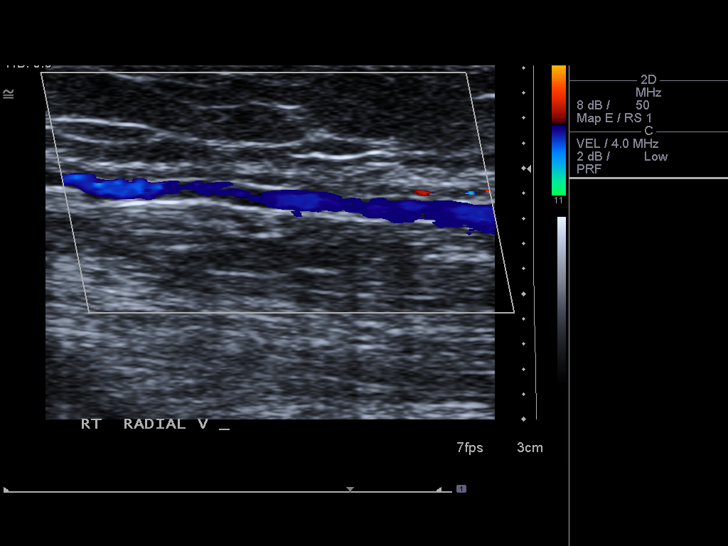
[im 29/36]
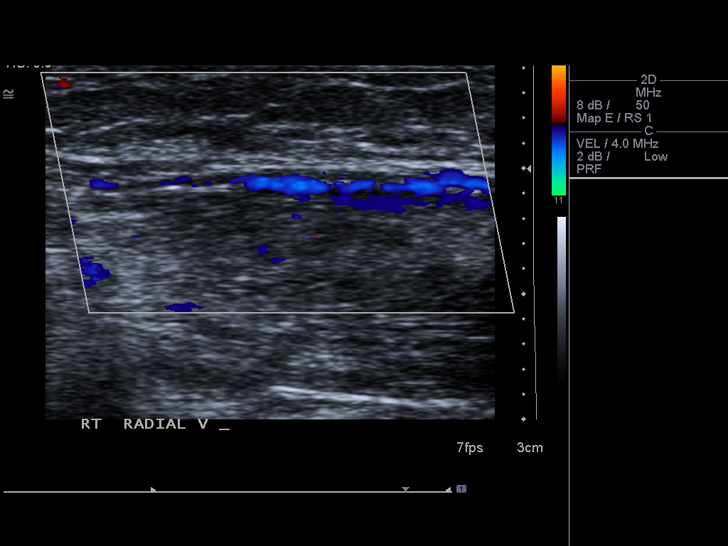
[im 32/36]
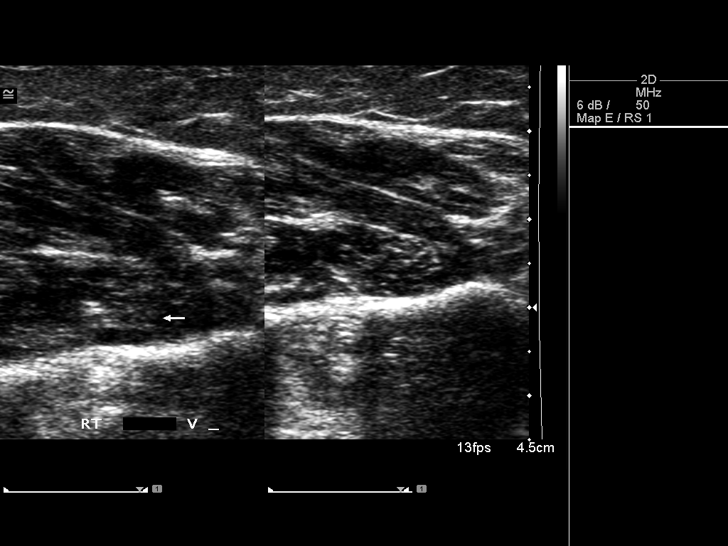
[im 36/36]
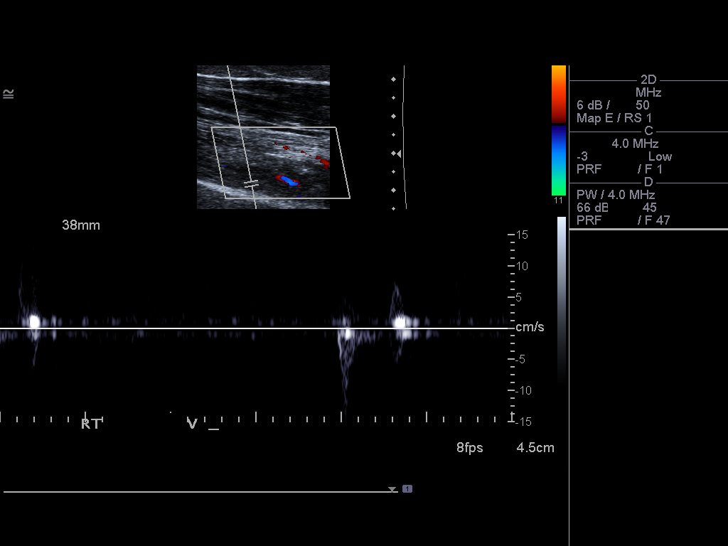

[14 of 24 positions shown; findings below may reference images not displayed]

FINDINGS: Normal compressibility of the upper extremity deep veins
is demonstrated.  No venous filling defects visualized on gray
scale or color Doppler US.  Normal direction of flow is seen
throughout the deep veins.  Spectral Doppler waveforms show normal
morphology at rest and with distal augmentation.

Incidentally noted is a 1.7 cm cystic nodule in the right thyroid
lobe.  This is only seen on a single image.
IMPRESSION: No evidence of right upper extremity deep venous thrombosis.

Cystic nodule in the right thyroid lobe incidentally noted.  This
could be fully evaluated with thyroid ultrasound.

## 2010-10-07 IMAGING — CT CT NECK W/ CM
1 series · 12 of 14 positions shown, 15 images · IV contrast (agent unspecified)
Comparison: P E T of 12/10/2008.  Chest CT of 11/26/2008.

CLINICAL DATA: Right anterior neck pain.  Dysphagia.  History of
gastric cancer.  Chemotherapy and radiation therapy complete.

CT NECK WITH CONTRAST
TECHNIQUE: Multidetector CT imaging of the neck was performed with
intravenous contrast.
Contrast: 100  ml 9mnipaque-TFF

[Series 2: neck rtn st · axial · 0.49mm/px · z∈[-265,-76]mm · 12 of 75 slices shown, 15 images]
[im 6/75  soft-tissue]
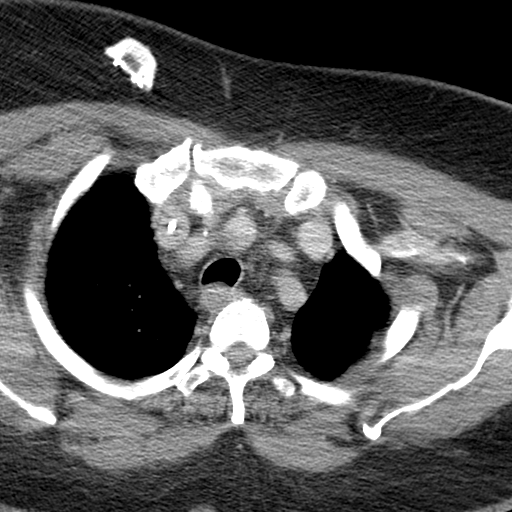
[im 6/75  bone]
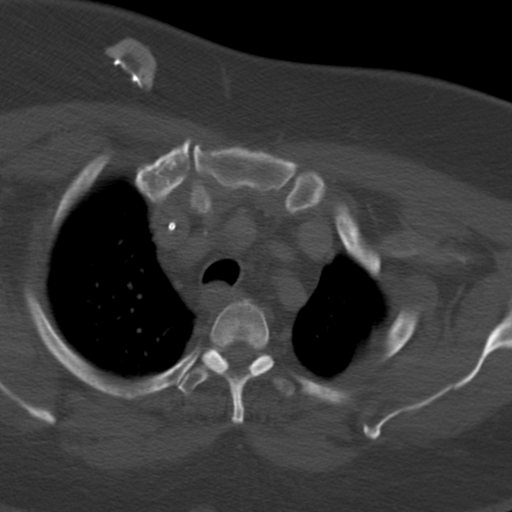
[im 12/75  bone]
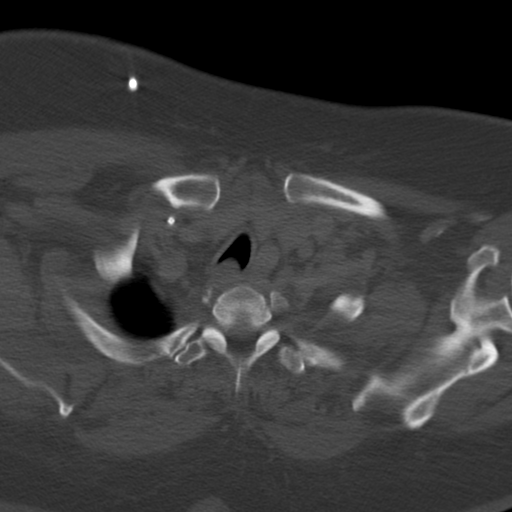
[im 18/75  bone]
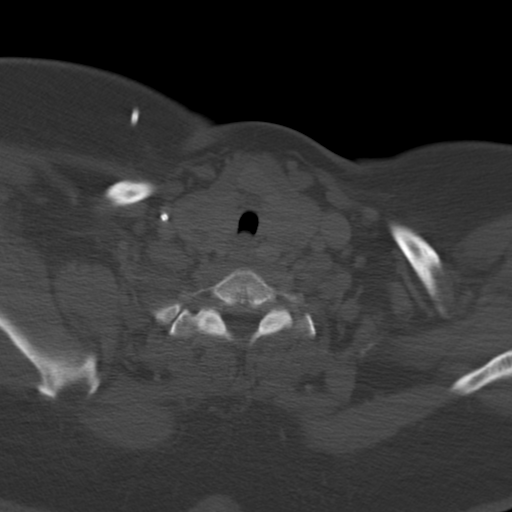
[im 23/75  bone]
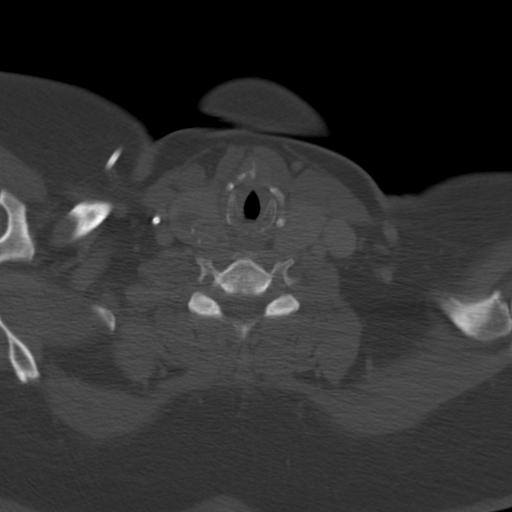
[im 29/75  soft-tissue]
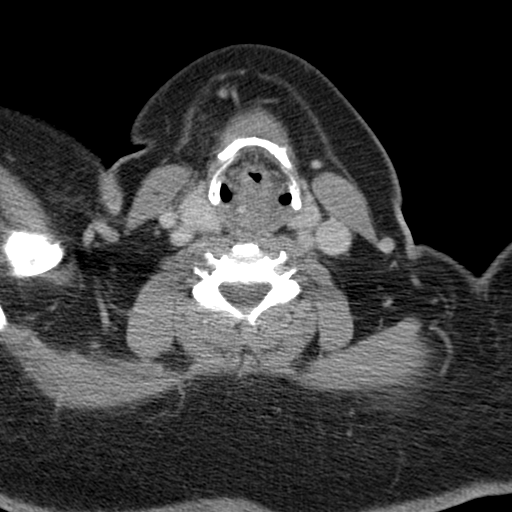
[im 29/75  bone]
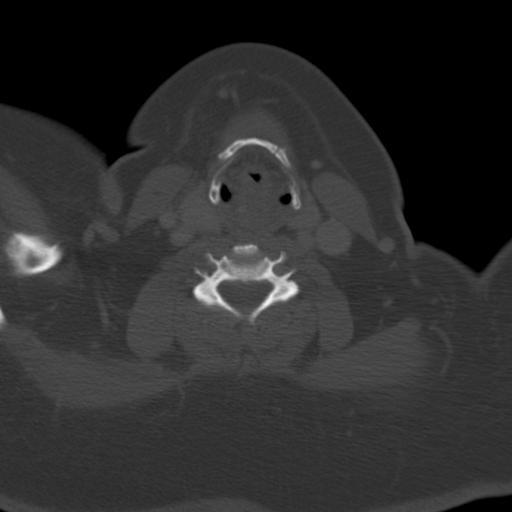
[im 35/75  bone]
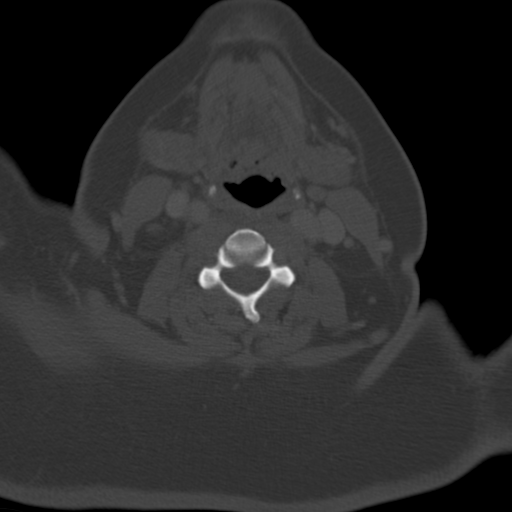
[im 40/75  bone]
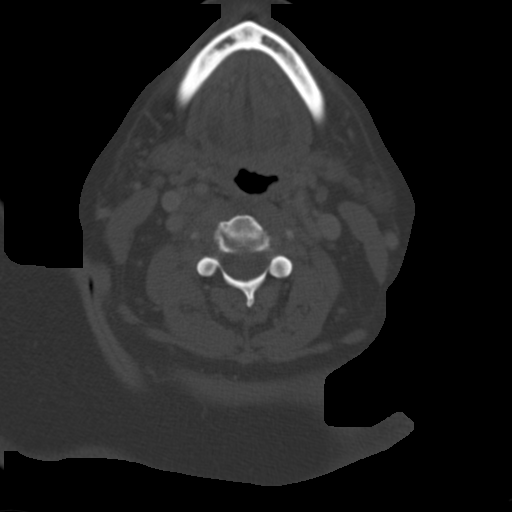
[im 46/75  bone]
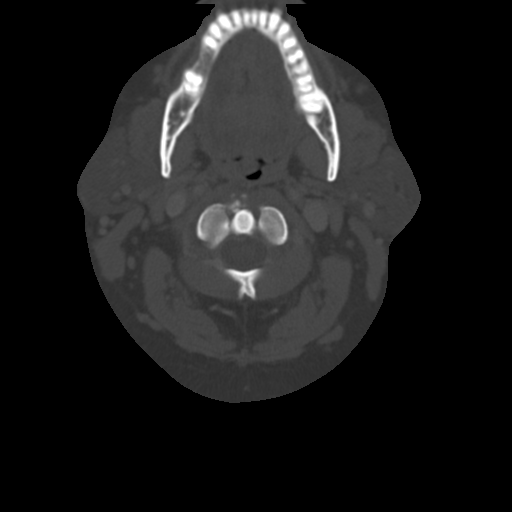
[im 52/75  soft-tissue]
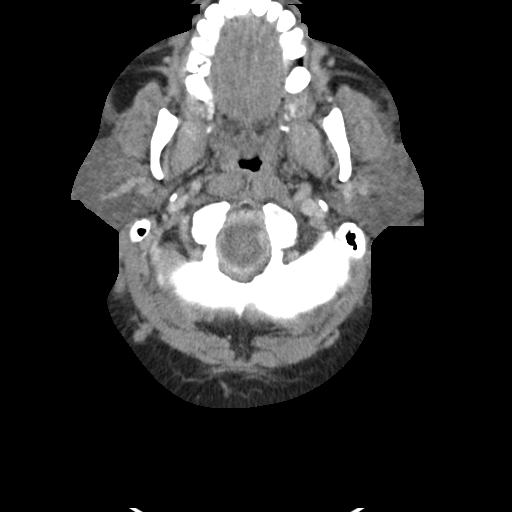
[im 52/75  bone]
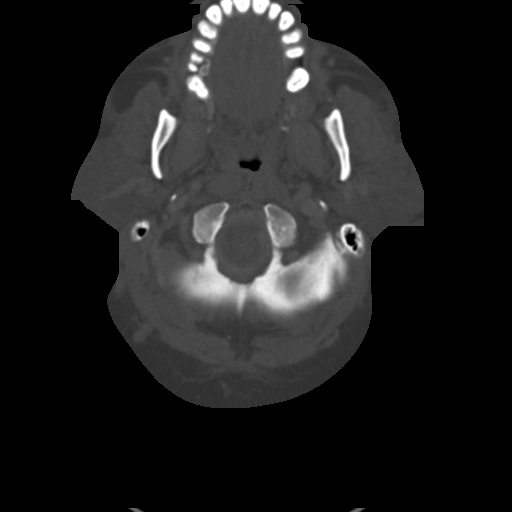
[im 57/75  bone]
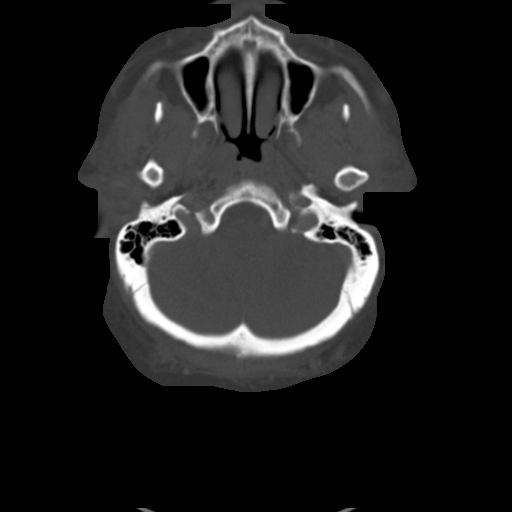
[im 63/75  bone]
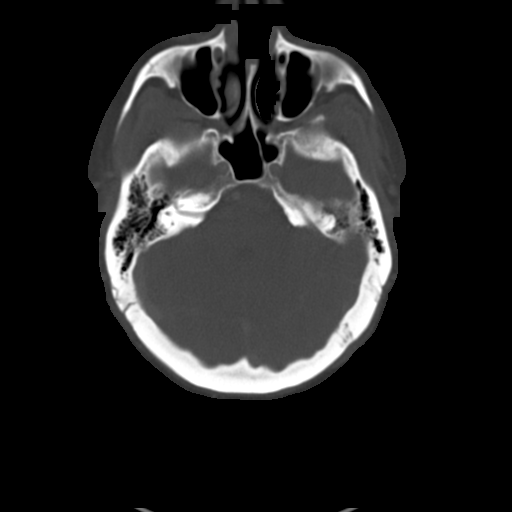
[im 69/75  bone]
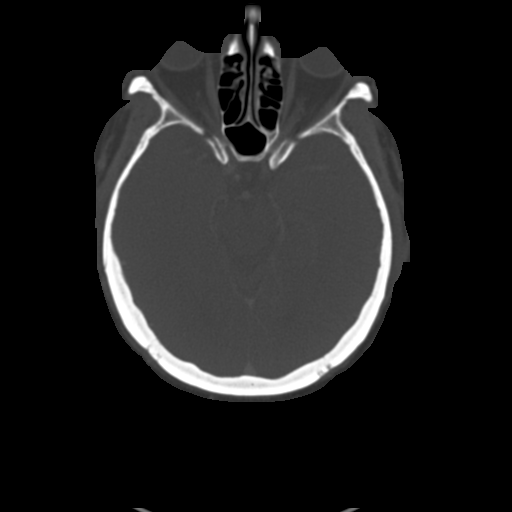

[12 of 14 positions shown; findings below may reference images not displayed]

FINDINGS: Limited intracranial imaging is within normal limits for.
Image portions of the orbits and globes unremarkable.  Normal
nasopharynx.  Artifact from dental hardware.  Mild underdistension
in the oral pharynx.  Normal larynx.

There is subtle retropharyngeal edema versus fluid.  Example image
38 of series 2.  No abnormal enhancement or well defined collection
to suggest abscess.  Sagittal image 35.

Diffuse enlargement of the thyroid with numerous hypoattenuating
nodules.  An exophytic nodule extends minimally into the thoracic
inlet at 1.3 cm on image 37.

Right-sided Port-A-Cath is incompletely imaged. Clear lung apices.

Submandibular glands and parotid glands enhance symmetrically.
Small cervical lymph nodes bilaterally.  No cervical adenopathy.

All vascular structures enhance normally. No acute osseous
abnormality. Exam is mildly degraded by patient's size.
IMPRESSION: 1.  Small amount of retropharyngeal fluid/edema, most consistent
with pharyngitis.  No evidence of well-defined collection or
abnormal enhancement to suggest abscess.
2.  Redemonstration of enlargement of the thyroid with numerous
solid and cystic lesions.  Consider further evaluation with non
emergent ultrasound.

## 2010-10-08 ENCOUNTER — Encounter (HOSPITAL_COMMUNITY): Payer: Self-pay

## 2010-10-08 ENCOUNTER — Ambulatory Visit (HOSPITAL_COMMUNITY)
Admission: RE | Admit: 2010-10-08 | Discharge: 2010-10-08 | Disposition: A | Discharge status: Home / Self Care | Payer: Medicare Other | Source: Ambulatory Visit | Attending: Hematology & Oncology | Admitting: Hematology & Oncology

## 2010-10-08 DIAGNOSIS — R05 Cough: Secondary | ICD-10-CM | POA: Insufficient documentation

## 2010-10-08 DIAGNOSIS — R059 Cough, unspecified: Secondary | ICD-10-CM | POA: Insufficient documentation

## 2010-10-08 DIAGNOSIS — K7689 Other specified diseases of liver: Secondary | ICD-10-CM | POA: Insufficient documentation

## 2010-10-08 DIAGNOSIS — I7789 Other specified disorders of arteries and arterioles: Secondary | ICD-10-CM | POA: Insufficient documentation

## 2010-10-08 DIAGNOSIS — C165 Malignant neoplasm of lesser curvature of stomach, unspecified: Secondary | ICD-10-CM | POA: Insufficient documentation

## 2010-10-08 DIAGNOSIS — I517 Cardiomegaly: Secondary | ICD-10-CM | POA: Insufficient documentation

## 2010-10-08 DIAGNOSIS — C169 Malignant neoplasm of stomach, unspecified: Secondary | ICD-10-CM

## 2010-10-08 DIAGNOSIS — K449 Diaphragmatic hernia without obstruction or gangrene: Secondary | ICD-10-CM | POA: Insufficient documentation

## 2010-10-08 HISTORY — DX: Malignant (primary) neoplasm, unspecified: C80.1

## 2010-10-08 HISTORY — DX: Essential (primary) hypertension: I10

## 2010-10-08 MED ORDER — IOHEXOL 300 MG/ML  SOLN
125.0000 mL | Freq: Once | INTRAMUSCULAR | Status: AC | PRN
  Administered 2010-10-08: 125 mL via INTRAVENOUS

## 2010-10-13 LAB — GLUCOSE, CAPILLARY
Glucose-Capillary: 101 mg/dL — ABNORMAL HIGH (ref 70–99)
Glucose-Capillary: 109 mg/dL — ABNORMAL HIGH (ref 70–99)
Glucose-Capillary: 117 mg/dL — ABNORMAL HIGH (ref 70–99)
Glucose-Capillary: 118 mg/dL — ABNORMAL HIGH (ref 70–99)
Glucose-Capillary: 120 mg/dL — ABNORMAL HIGH (ref 70–99)
Glucose-Capillary: 131 mg/dL — ABNORMAL HIGH (ref 70–99)
Glucose-Capillary: 131 mg/dL — ABNORMAL HIGH (ref 70–99)
Glucose-Capillary: 139 mg/dL — ABNORMAL HIGH (ref 70–99)
Glucose-Capillary: 148 mg/dL — ABNORMAL HIGH (ref 70–99)
Glucose-Capillary: 149 mg/dL — ABNORMAL HIGH (ref 70–99)
Glucose-Capillary: 151 mg/dL — ABNORMAL HIGH (ref 70–99)
Glucose-Capillary: 159 mg/dL — ABNORMAL HIGH (ref 70–99)
Glucose-Capillary: 162 mg/dL — ABNORMAL HIGH (ref 70–99)
Glucose-Capillary: 256 mg/dL — ABNORMAL HIGH (ref 70–99)
Glucose-Capillary: 77 mg/dL (ref 70–99)
Glucose-Capillary: 93 mg/dL (ref 70–99)

## 2010-10-13 LAB — APTT: aPTT: 28 seconds (ref 24–37)

## 2010-10-13 LAB — PROTIME-INR
INR: 1.09 (ref 0.00–1.49)
INR: 1.29 (ref 0.00–1.49)
INR: 1.45 (ref 0.00–1.49)
INR: 1.81 — ABNORMAL HIGH (ref 0.00–1.49)
INR: 2.15 — ABNORMAL HIGH (ref 0.00–1.49)
INR: 2.73 — ABNORMAL HIGH (ref 0.00–1.49)
Prothrombin Time: 14 seconds (ref 11.6–15.2)
Prothrombin Time: 20.8 seconds — ABNORMAL HIGH (ref 11.6–15.2)
Prothrombin Time: 23.8 seconds — ABNORMAL HIGH (ref 11.6–15.2)
Prothrombin Time: 33.5 seconds — ABNORMAL HIGH (ref 11.6–15.2)

## 2010-10-13 LAB — CBC
HCT: 25.2 % — ABNORMAL LOW (ref 36.0–46.0)
HCT: 28.9 % — ABNORMAL LOW (ref 36.0–46.0)
Hemoglobin: 12.4 g/dL (ref 12.0–15.0)
Hemoglobin: 8.8 g/dL — ABNORMAL LOW (ref 12.0–15.0)
Hemoglobin: 9.7 g/dL — ABNORMAL LOW (ref 12.0–15.0)
MCHC: 33.5 g/dL (ref 30.0–36.0)
MCHC: 33.6 g/dL (ref 30.0–36.0)
MCV: 85.4 fL (ref 78.0–100.0)
MCV: 85.5 fL (ref 78.0–100.0)
Platelets: 181 10*3/uL (ref 150–400)
Platelets: 235 10*3/uL (ref 150–400)
RBC: 3.06 MIL/uL — ABNORMAL LOW (ref 3.87–5.11)
RDW: 15.1 % (ref 11.5–15.5)
RDW: 15.3 % (ref 11.5–15.5)
RDW: 15.4 % (ref 11.5–15.5)
WBC: 6.7 10*3/uL (ref 4.0–10.5)
WBC: 9.5 10*3/uL (ref 4.0–10.5)

## 2010-10-13 LAB — COMPREHENSIVE METABOLIC PANEL
ALT: 16 U/L (ref 0–35)
BUN: 7 mg/dL (ref 6–23)
Calcium: 9.5 mg/dL (ref 8.4–10.5)
Creatinine, Ser: 0.69 mg/dL (ref 0.4–1.2)
Glucose, Bld: 127 mg/dL — ABNORMAL HIGH (ref 70–99)
Sodium: 140 mEq/L (ref 135–145)
Total Protein: 6.8 g/dL (ref 6.0–8.3)

## 2010-10-13 LAB — BASIC METABOLIC PANEL
BUN: 6 mg/dL (ref 6–23)
CO2: 29 mEq/L (ref 19–32)
Calcium: 8.4 mg/dL (ref 8.4–10.5)
Calcium: 8.5 mg/dL (ref 8.4–10.5)
Calcium: 8.6 mg/dL (ref 8.4–10.5)
Chloride: 103 mEq/L (ref 96–112)
Creatinine, Ser: 0.61 mg/dL (ref 0.4–1.2)
GFR calc Af Amer: >60 mL/min (ref >60)
GFR calc non Af Amer: >60 mL/min (ref >60)
GFR calc non Af Amer: >60 mL/min (ref >60)
Glucose, Bld: 135 mg/dL — ABNORMAL HIGH (ref 70–99)
Glucose, Bld: 163 mg/dL — ABNORMAL HIGH (ref 70–99)
Potassium: 3.4 mEq/L — ABNORMAL LOW (ref 3.5–5.1)
Potassium: 3.5 mEq/L (ref 3.5–5.1)
Sodium: 137 mEq/L (ref 135–145)
Sodium: 137 mEq/L (ref 135–145)

## 2010-10-25 IMAGING — CT CT ABDOMEN W/ CM
4 series · 11 of 46 positions shown, 16 images · IV contrast (APPLIED)
Comparison: Prior CTs of the chest, abdomen and pelvis 11/26/2008
and PET CT 12/10/2008.

CT CHEST

CLINICAL DATA: Restaging gastric cancer status post partial
gastrectomy, chemotherapy and radiation therapy.

CT CHEST, ABDOMEN AND PELVIS WITH CONTRAST
TECHNIQUE: Multidetector CT imaging of the chest, abdomen and
pelvis was performed following the standard protocol during bolus
administration of intravenous contrast.
Contrast: 100 ml Dmnipaque-GJJ intravenously.

[Series 2: chest/abd/pel 5.0 b31f · axial · 0.74mm/px · z∈[-626,-546]mm · 2 of 126 slices shown]
[im 11/126  soft-tissue]
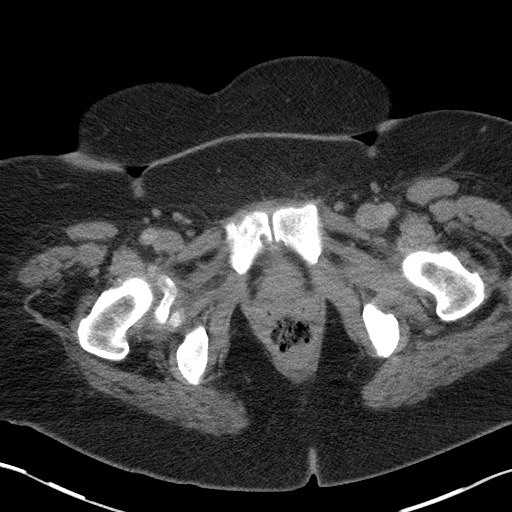
[im 27/126  soft-tissue]
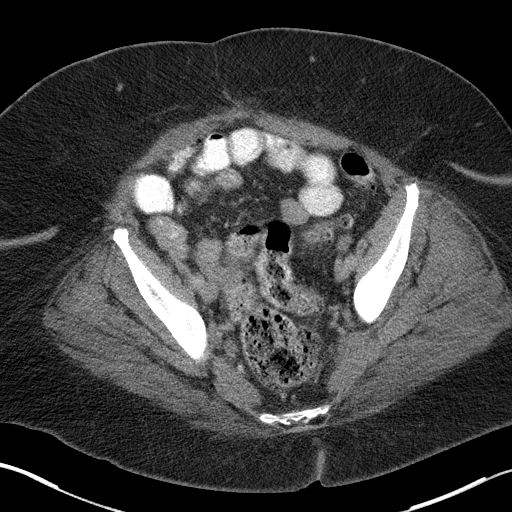

[Series 3: chest/abd/pel 3.0 coronal · coronal · 0.77mm/px · 3 of 96 slices shown]
[im 32/96  soft-tissue]
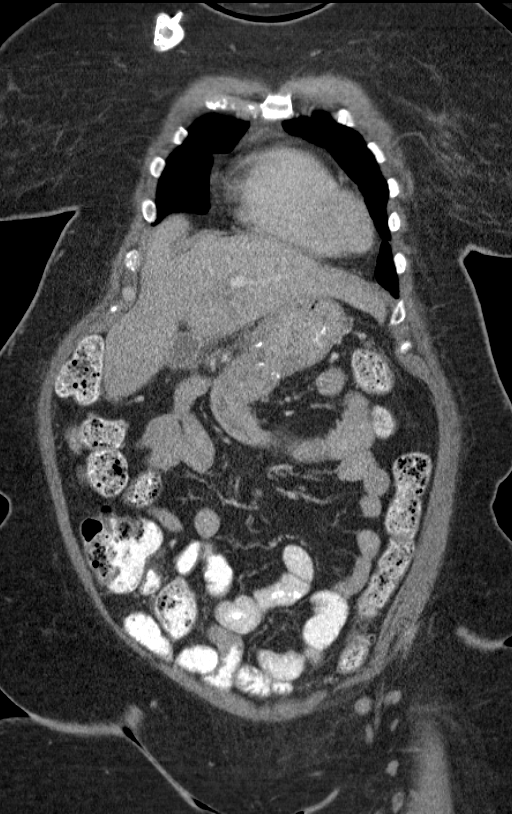
[im 43/96  soft-tissue]
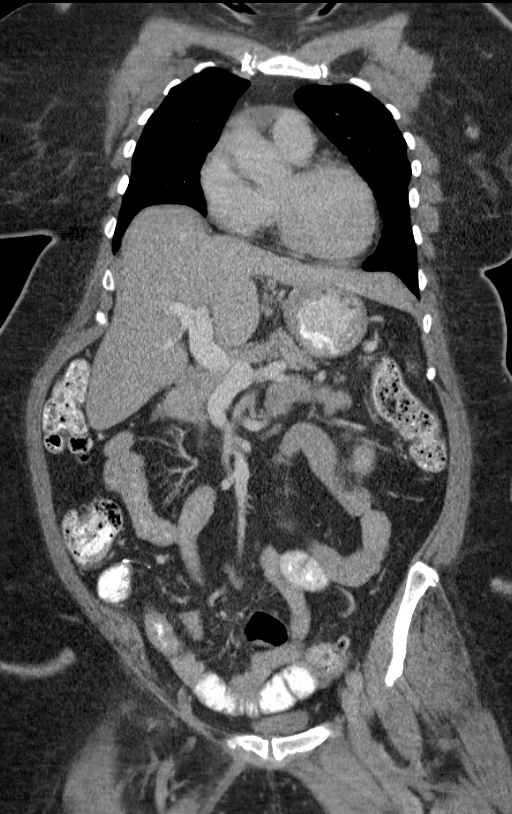
[im 53/96  soft-tissue]
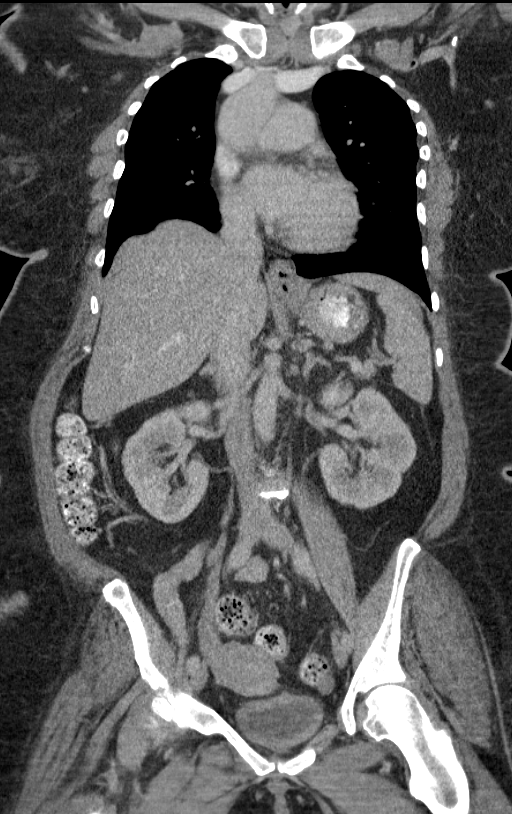

[Series 4: chest/abd/pel 3.0 sagittal · sagittal · 0.76mm/px · 1 of 121 slices shown]
[im 41/121  soft-tissue]
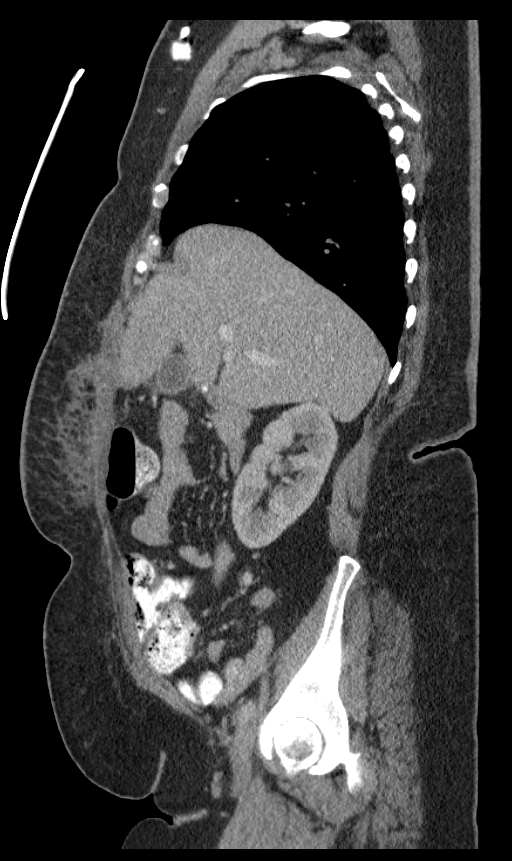

[Series 7: renal delay 5.0 b30s · axial · delayed · 0.78mm/px · z∈[-447,-337]mm · 5 of 34 slices shown, 10 images]
[im 6/34  soft-tissue]
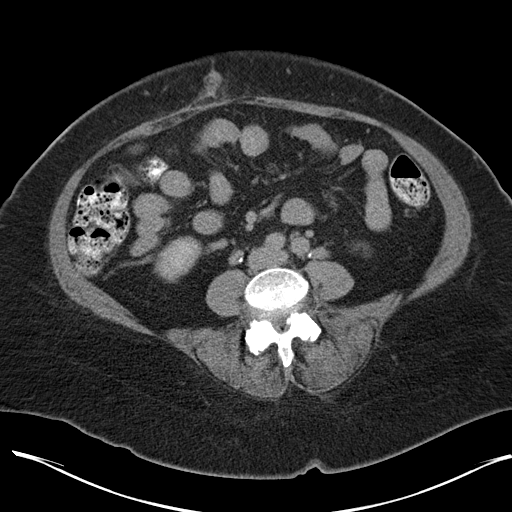
[im 6/34  bone]
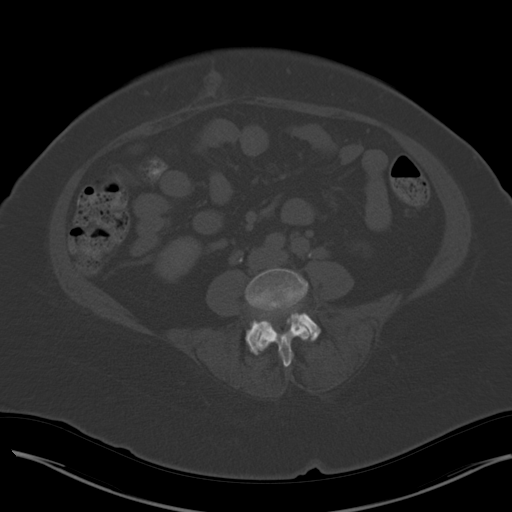
[im 12/34  soft-tissue]
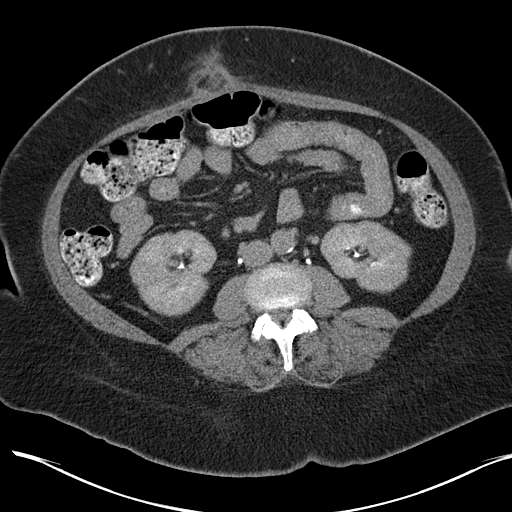
[im 12/34  lung]
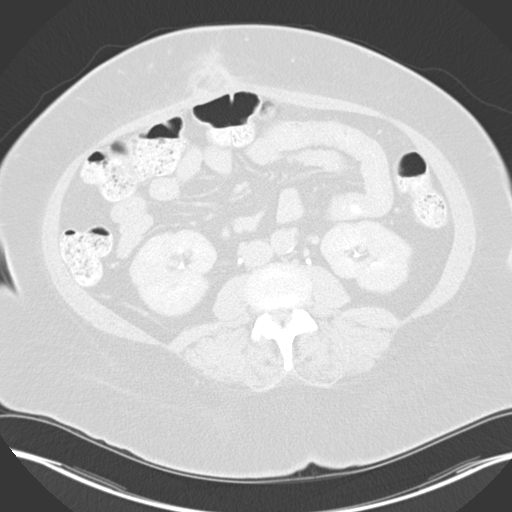
[im 17/34  soft-tissue]
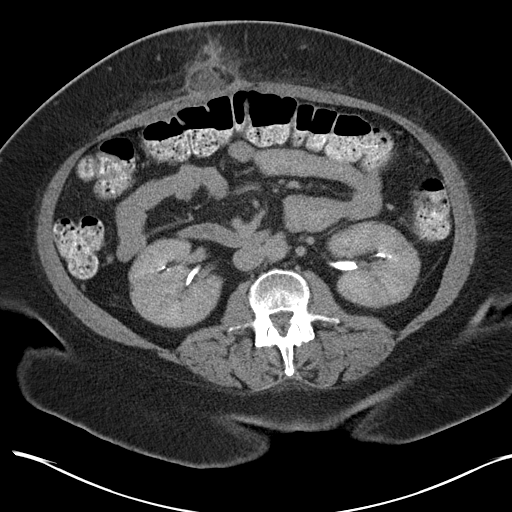
[im 17/34  lung]
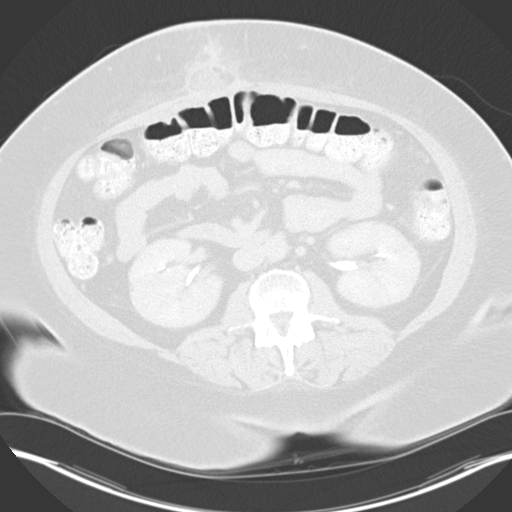
[im 23/34  soft-tissue]
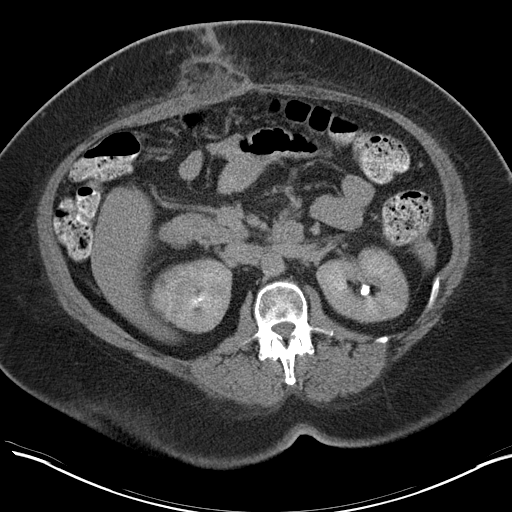
[im 23/34  lung]
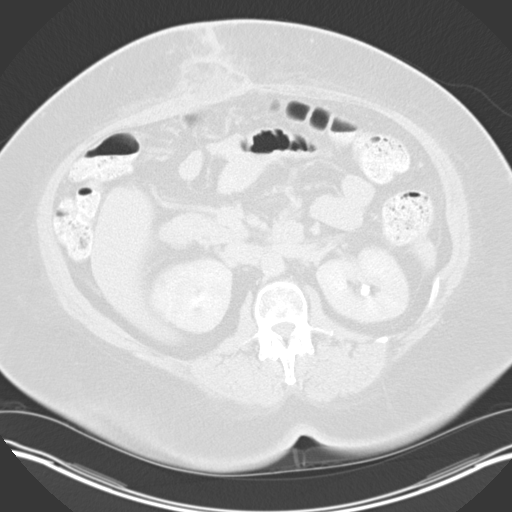
[im 28/34  soft-tissue]
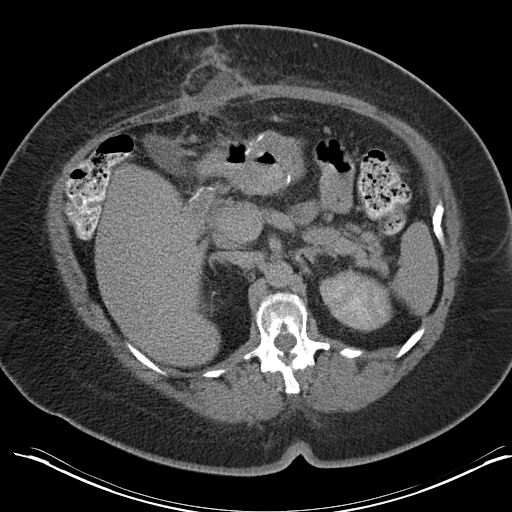
[im 28/34  lung]
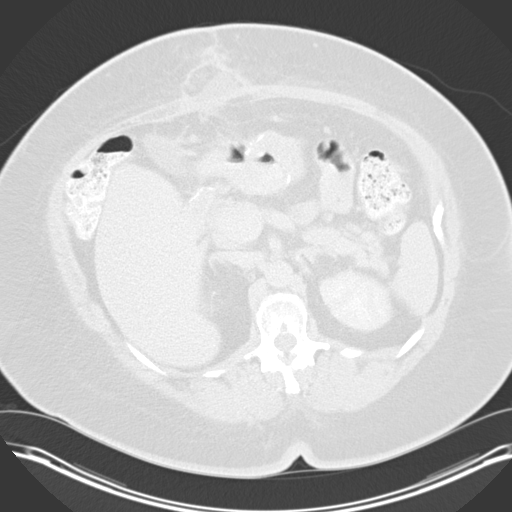

[11 of 46 positions shown; findings below may reference images not displayed]

FINDINGS: Bilateral low density thyroid nodules appear stable.
Right IJ Port-A-Cath tip is near the SVC right atrial junction.
There are no enlarged mediastinal or hilar lymph nodes.  There is
no pleural or pericardial effusion.  A small hiatal hernia appears
stable.  The lungs remain clear.
IMPRESSION: 1.  Stable examination without evidence of metastatic disease.
2.  Stable bilateral thyroid nodules.

CT ABDOMEN
FINDINGS: There are postsurgical changes status post interval
partial gastrectomy and gastrojejunostomy.  There is no evidence of
bowel obstruction or extraluminal fluid collection within the
abdomen.  There is a fluid collection within the anterior abdominal
wall, just off midline to the right.  This measures 2.8 cm
maximally transverse, but measures up to 10.7 cm in length.  There
is surrounding fat which may represent a small incisional hernia.
There is no herniated bowel.

No enlarged lymph nodes are seen within the upper abdomen.  There
is no evidence of hepatic metastatic disease.  The spleen,
gallbladder, adrenal glands, pancreas and kidneys appear normal.
Moderate facet degenerative changes of the lower lumbar spine are
present.
IMPRESSION: 1.  Interval partial gastrectomy and gastrojejunostomy.
2.  No evidence of metastatic disease.
3.  Tubular shaped fluid collection within the anterior abdominal
wall may be associated with an incisional hernia.

CT PELVIS
FINDINGS: There is no pelvic mass, fluid collection or inflammatory
process.  The uterus and ovaries appear normal.  There are no
enlarged pelvic lymph nodes.  Asymmetric degenerative changes of
the right hip are noted.
IMPRESSION: Stable appearance of the pelvis.  No evidence of metastatic
disease.

## 2010-10-26 LAB — URINALYSIS, ROUTINE W REFLEX MICROSCOPIC
Bilirubin Urine: NEGATIVE
Nitrite: NEGATIVE
Specific Gravity, Urine: 1.026 (ref 1.005–1.030)
pH: 7 (ref 5.0–8.0)

## 2010-10-26 LAB — CBC
Hemoglobin: 11.8 g/dL — ABNORMAL LOW (ref 12.0–15.0)
RBC: 4.22 MIL/uL (ref 3.87–5.11)
RDW: 18.8 % — ABNORMAL HIGH (ref 11.5–15.5)
WBC: 4.2 10*3/uL (ref 4.0–10.5)

## 2010-10-26 LAB — DIFFERENTIAL
Basophils Absolute: 0 10*3/uL (ref 0.0–0.1)
Lymphocytes Relative: 29 % (ref 12–46)
Lymphs Abs: 1.2 10*3/uL (ref 0.7–4.0)
Monocytes Absolute: 0.6 10*3/uL (ref 0.1–1.0)
Monocytes Relative: 15 % — ABNORMAL HIGH (ref 3–12)
Neutro Abs: 2.2 10*3/uL (ref 1.7–7.7)

## 2010-10-26 LAB — BASIC METABOLIC PANEL
Calcium: 9.1 mg/dL (ref 8.4–10.5)
GFR calc Af Amer: >60 mL/min (ref >60)
GFR calc non Af Amer: >60 mL/min (ref >60)
Sodium: 140 mEq/L (ref 135–145)

## 2010-10-27 LAB — URINE CULTURE: Colony Count: >=100000

## 2010-10-27 LAB — GLUCOSE, CAPILLARY
Glucose-Capillary: 100 mg/dL — ABNORMAL HIGH (ref 70–99)
Glucose-Capillary: 101 mg/dL — ABNORMAL HIGH (ref 70–99)
Glucose-Capillary: 109 mg/dL — ABNORMAL HIGH (ref 70–99)
Glucose-Capillary: 119 mg/dL — ABNORMAL HIGH (ref 70–99)
Glucose-Capillary: 120 mg/dL — ABNORMAL HIGH (ref 70–99)
Glucose-Capillary: 124 mg/dL — ABNORMAL HIGH (ref 70–99)
Glucose-Capillary: 133 mg/dL — ABNORMAL HIGH (ref 70–99)
Glucose-Capillary: 134 mg/dL — ABNORMAL HIGH (ref 70–99)
Glucose-Capillary: 177 mg/dL — ABNORMAL HIGH (ref 70–99)
Glucose-Capillary: 73 mg/dL (ref 70–99)
Glucose-Capillary: 95 mg/dL (ref 70–99)

## 2010-10-27 LAB — BASIC METABOLIC PANEL
BUN: 1 mg/dL — ABNORMAL LOW (ref 6–23)
BUN: 3 mg/dL — ABNORMAL LOW (ref 6–23)
BUN: 3 mg/dL — ABNORMAL LOW (ref 6–23)
BUN: 4 mg/dL — ABNORMAL LOW (ref 6–23)
CO2: 23 mEq/L (ref 19–32)
CO2: 28 mEq/L (ref 19–32)
CO2: 31 mEq/L (ref 19–32)
Calcium: 7.1 mg/dL — ABNORMAL LOW (ref 8.4–10.5)
Calcium: 8.2 mg/dL — ABNORMAL LOW (ref 8.4–10.5)
Calcium: 8.4 mg/dL (ref 8.4–10.5)
Calcium: 8.6 mg/dL (ref 8.4–10.5)
Chloride: 110 mEq/L (ref 96–112)
Chloride: 115 mEq/L — ABNORMAL HIGH (ref 96–112)
Creatinine, Ser: 0.62 mg/dL (ref 0.4–1.2)
Creatinine, Ser: 0.66 mg/dL (ref 0.4–1.2)
GFR calc Af Amer: >60 mL/min (ref >60)
GFR calc non Af Amer: >60 mL/min (ref >60)
GFR calc non Af Amer: >60 mL/min (ref >60)
GFR calc non Af Amer: >60 mL/min (ref >60)
Glucose, Bld: 134 mg/dL — ABNORMAL HIGH (ref 70–99)
Glucose, Bld: 195 mg/dL — ABNORMAL HIGH (ref 70–99)
Glucose, Bld: 94 mg/dL (ref 70–99)
Potassium: 2.9 mEq/L — ABNORMAL LOW (ref 3.5–5.1)
Potassium: 3.7 mEq/L (ref 3.5–5.1)
Sodium: 141 mEq/L (ref 135–145)
Sodium: 141 mEq/L (ref 135–145)

## 2010-10-27 LAB — CBC
HCT: 33.7 % — ABNORMAL LOW (ref 36.0–46.0)
Hemoglobin: 11.4 g/dL — ABNORMAL LOW (ref 12.0–15.0)
MCHC: 33.8 g/dL (ref 30.0–36.0)
Platelets: 208 10*3/uL (ref 150–400)
Platelets: 219 10*3/uL (ref 150–400)
RDW: 17.1 % — ABNORMAL HIGH (ref 11.5–15.5)
RDW: 18.3 % — ABNORMAL HIGH (ref 11.5–15.5)

## 2010-10-27 LAB — URINALYSIS, ROUTINE W REFLEX MICROSCOPIC
Nitrite: NEGATIVE
Protein, ur: NEGATIVE mg/dL
pH: 6 (ref 5.0–8.0)

## 2010-10-31 LAB — CBC
Hemoglobin: 10.2 g/dL — ABNORMAL LOW (ref 12.0–15.0)
MCHC: 32.6 g/dL (ref 30.0–36.0)
Platelets: 328 10*3/uL (ref 150–400)
RDW: 16.1 % — ABNORMAL HIGH (ref 11.5–15.5)
WBC: 6.4 10*3/uL (ref 4.0–10.5)

## 2010-10-31 LAB — GLUCOSE, CAPILLARY
Glucose-Capillary: 309 mg/dL — ABNORMAL HIGH (ref 70–99)
Glucose-Capillary: 196 mg/dL — ABNORMAL HIGH (ref 70–99)
Glucose-Capillary: 197 mg/dL — ABNORMAL HIGH (ref 70–99)
Glucose-Capillary: 205 mg/dL — ABNORMAL HIGH (ref 70–99)
Glucose-Capillary: 210 mg/dL — ABNORMAL HIGH (ref 70–99)
Glucose-Capillary: 213 mg/dL — ABNORMAL HIGH (ref 70–99)
Glucose-Capillary: 214 mg/dL — ABNORMAL HIGH (ref 70–99)
Glucose-Capillary: 228 mg/dL — ABNORMAL HIGH (ref 70–99)
Glucose-Capillary: 236 mg/dL — ABNORMAL HIGH (ref 70–99)
Glucose-Capillary: 236 mg/dL — ABNORMAL HIGH (ref 70–99)
Glucose-Capillary: 249 mg/dL — ABNORMAL HIGH (ref 70–99)
Glucose-Capillary: 250 mg/dL — ABNORMAL HIGH (ref 70–99)
Glucose-Capillary: 252 mg/dL — ABNORMAL HIGH (ref 70–99)
Glucose-Capillary: 262 mg/dL — ABNORMAL HIGH (ref 70–99)
Glucose-Capillary: 305 mg/dL — ABNORMAL HIGH (ref 70–99)
Glucose-Capillary: 319 mg/dL — ABNORMAL HIGH (ref 70–99)

## 2010-11-01 LAB — COMPREHENSIVE METABOLIC PANEL WITH GFR
ALT: 30 U/L (ref 0–35)
ALT: 36 U/L — ABNORMAL HIGH (ref 0–35)
AST: 42 U/L — ABNORMAL HIGH (ref 0–37)
AST: 50 U/L — ABNORMAL HIGH (ref 0–37)
Albumin: 3.1 g/dL — ABNORMAL LOW (ref 3.5–5.2)
Albumin: 3.3 g/dL — ABNORMAL LOW (ref 3.5–5.2)
Alkaline Phosphatase: 53 U/L (ref 39–117)
Alkaline Phosphatase: 68 U/L (ref 39–117)
BUN: 4 mg/dL — ABNORMAL LOW (ref 6–23)
BUN: 7 mg/dL (ref 6–23)
CO2: 28 meq/L (ref 19–32)
CO2: 30 meq/L (ref 19–32)
Calcium: 9.2 mg/dL (ref 8.4–10.5)
Calcium: 9.8 mg/dL (ref 8.4–10.5)
Chloride: 106 meq/L (ref 96–112)
Chloride: 98 meq/L (ref 96–112)
Creatinine, Ser: 0.7 mg/dL (ref 0.4–1.2)
Creatinine, Ser: 0.88 mg/dL (ref 0.4–1.2)
GFR calc non Af Amer: >60 mL/min
GFR calc non Af Amer: >60 mL/min
Glucose, Bld: 264 mg/dL — ABNORMAL HIGH (ref 70–99)
Glucose, Bld: 309 mg/dL — ABNORMAL HIGH (ref 70–99)
Potassium: 3.9 meq/L (ref 3.5–5.1)
Potassium: 4.1 meq/L (ref 3.5–5.1)
Sodium: 141 meq/L (ref 135–145)
Sodium: 142 meq/L (ref 135–145)
Total Bilirubin: 0.5 mg/dL (ref 0.3–1.2)
Total Bilirubin: 0.7 mg/dL (ref 0.3–1.2)
Total Protein: 6.3 g/dL (ref 6.0–8.3)
Total Protein: 6.5 g/dL (ref 6.0–8.3)

## 2010-11-01 LAB — GLUCOSE, CAPILLARY
Glucose-Capillary: 229 mg/dL — ABNORMAL HIGH (ref 70–99)
Glucose-Capillary: 235 mg/dL — ABNORMAL HIGH (ref 70–99)
Glucose-Capillary: 235 mg/dL — ABNORMAL HIGH (ref 70–99)
Glucose-Capillary: 257 mg/dL — ABNORMAL HIGH (ref 70–99)
Glucose-Capillary: 266 mg/dL — ABNORMAL HIGH (ref 70–99)
Glucose-Capillary: 267 mg/dL — ABNORMAL HIGH (ref 70–99)
Glucose-Capillary: 285 mg/dL — ABNORMAL HIGH (ref 70–99)
Glucose-Capillary: 286 mg/dL — ABNORMAL HIGH (ref 70–99)
Glucose-Capillary: 307 mg/dL — ABNORMAL HIGH (ref 70–99)

## 2010-11-01 LAB — CROSSMATCH
ABO/RH(D): O POS
Antibody Screen: NEGATIVE

## 2010-11-01 LAB — COMPREHENSIVE METABOLIC PANEL
AST: 21 U/L (ref 0–37)
Albumin: 2.7 g/dL — ABNORMAL LOW (ref 3.5–5.2)
Chloride: 105 mEq/L (ref 96–112)
Creatinine, Ser: 0.74 mg/dL (ref 0.4–1.2)
GFR calc Af Amer: >60 mL/min (ref >60)
Potassium: 4.2 mEq/L (ref 3.5–5.1)
Total Bilirubin: 0.8 mg/dL (ref 0.3–1.2)

## 2010-11-01 LAB — CBC
HCT: 32.8 % — ABNORMAL LOW (ref 36.0–46.0)
HCT: 34.7 % — ABNORMAL LOW (ref 36.0–46.0)
Hemoglobin: 10.7 g/dL — ABNORMAL LOW (ref 12.0–15.0)
MCHC: 32.7 g/dL (ref 30.0–36.0)
MCV: 85.3 fL (ref 78.0–100.0)
MCV: 85.5 fL (ref 78.0–100.0)
Platelets: 228 10*3/uL (ref 150–400)
Platelets: 245 10*3/uL (ref 150–400)
Platelets: 263 10*3/uL (ref 150–400)
RBC: 3.85 MIL/uL — ABNORMAL LOW (ref 3.87–5.11)
RBC: 4.06 MIL/uL (ref 3.87–5.11)
RDW: 16 % — ABNORMAL HIGH (ref 11.5–15.5)
WBC: 11.2 10*3/uL — ABNORMAL HIGH (ref 4.0–10.5)
WBC: 12.8 10*3/uL — ABNORMAL HIGH (ref 4.0–10.5)
WBC: 8.5 10*3/uL (ref 4.0–10.5)

## 2010-11-01 LAB — BLOOD GAS, ARTERIAL
Acid-Base Excess: 1.4 mmol/L (ref 0.0–2.0)
Bicarbonate: 25.8 meq/L — ABNORMAL HIGH (ref 20.0–24.0)
Drawn by: 129801
FIO2: 0.4 %
MECHVT: 800 mL
O2 Saturation: 97.1 %
PEEP: 5 cmH2O
Patient temperature: 98.6
RATE: 9 {breaths}/min
TCO2: 23.7 mmol/L (ref 0–100)
pCO2 arterial: 42.4 mmHg (ref 35.0–45.0)
pH, Arterial: 7.402 — ABNORMAL HIGH (ref 7.350–7.400)
pO2, Arterial: 91 mmHg (ref 80.0–100.0)

## 2010-11-01 LAB — CARDIAC PANEL(CRET KIN+CKTOT+MB+TROPI)
CK, MB: 1.1 ng/mL (ref 0.3–4.0)
Total CK: 767 U/L — ABNORMAL HIGH (ref 7–177)
Troponin I: 0.02 ng/mL (ref 0.00–0.06)

## 2010-11-01 LAB — PROTIME-INR: Prothrombin Time: 13.6 seconds (ref 11.6–15.2)

## 2010-11-01 LAB — DIFFERENTIAL
Basophils Absolute: 0 10*3/uL (ref 0.0–0.1)
Basophils Relative: 1 % (ref 0–1)
Eosinophils Relative: 15 % — ABNORMAL HIGH (ref 0–5)
Monocytes Absolute: 0.6 10*3/uL (ref 0.1–1.0)

## 2010-11-03 LAB — HEMOGLOBIN AND HEMATOCRIT, BLOOD
HCT: 37.1 % (ref 36.0–46.0)
Hemoglobin: 12.1 g/dL (ref 12.0–15.0)

## 2010-11-03 LAB — GLUCOSE, CAPILLARY
Glucose-Capillary: 246 mg/dL — ABNORMAL HIGH (ref 70–99)
Glucose-Capillary: 277 mg/dL — ABNORMAL HIGH (ref 70–99)

## 2010-11-03 LAB — BASIC METABOLIC PANEL
Calcium: 9.9 mg/dL (ref 8.4–10.5)
Chloride: 97 mEq/L (ref 96–112)
Creatinine, Ser: 0.99 mg/dL (ref 0.4–1.2)
GFR calc Af Amer: >60 mL/min (ref >60)
GFR calc non Af Amer: 58 mL/min — ABNORMAL LOW (ref >60)

## 2010-11-05 ENCOUNTER — Ambulatory Visit (HOSPITAL_COMMUNITY)
Admission: RE | Admit: 2010-11-05 | Discharge: 2010-11-06 | Disposition: A | Discharge status: Home / Self Care | Payer: Medicare Other | Source: Ambulatory Visit | Attending: Orthopedic Surgery | Admitting: Orthopedic Surgery

## 2010-11-05 DIAGNOSIS — I10 Essential (primary) hypertension: Secondary | ICD-10-CM | POA: Insufficient documentation

## 2010-11-05 DIAGNOSIS — S43439A Superior glenoid labrum lesion of unspecified shoulder, initial encounter: Secondary | ICD-10-CM | POA: Insufficient documentation

## 2010-11-05 DIAGNOSIS — M719 Bursopathy, unspecified: Secondary | ICD-10-CM | POA: Insufficient documentation

## 2010-11-05 DIAGNOSIS — E1142 Type 2 diabetes mellitus with diabetic polyneuropathy: Secondary | ICD-10-CM | POA: Insufficient documentation

## 2010-11-05 DIAGNOSIS — E1149 Type 2 diabetes mellitus with other diabetic neurological complication: Secondary | ICD-10-CM | POA: Insufficient documentation

## 2010-11-05 DIAGNOSIS — Z79899 Other long term (current) drug therapy: Secondary | ICD-10-CM | POA: Insufficient documentation

## 2010-11-05 DIAGNOSIS — M67919 Unspecified disorder of synovium and tendon, unspecified shoulder: Secondary | ICD-10-CM | POA: Insufficient documentation

## 2010-11-05 DIAGNOSIS — J45909 Unspecified asthma, uncomplicated: Secondary | ICD-10-CM | POA: Insufficient documentation

## 2010-11-05 DIAGNOSIS — X58XXXA Exposure to other specified factors, initial encounter: Secondary | ICD-10-CM | POA: Insufficient documentation

## 2010-11-05 DIAGNOSIS — E669 Obesity, unspecified: Secondary | ICD-10-CM | POA: Insufficient documentation

## 2010-11-05 DIAGNOSIS — Z01812 Encounter for preprocedural laboratory examination: Secondary | ICD-10-CM | POA: Insufficient documentation

## 2010-11-05 DIAGNOSIS — M7500 Adhesive capsulitis of unspecified shoulder: Secondary | ICD-10-CM | POA: Insufficient documentation

## 2010-11-05 DIAGNOSIS — M25819 Other specified joint disorders, unspecified shoulder: Secondary | ICD-10-CM | POA: Insufficient documentation

## 2010-11-05 LAB — PROTIME-INR
INR: 0.98 (ref 0.00–1.49)
Prothrombin Time: 13.2 seconds (ref 11.6–15.2)

## 2010-11-05 LAB — COMPREHENSIVE METABOLIC PANEL
BUN: 10 mg/dL (ref 6–23)
CO2: 27 mEq/L (ref 19–32)
Calcium: 9.1 mg/dL (ref 8.4–10.5)
GFR calc non Af Amer: >60 mL/min (ref >60)
Glucose, Bld: 163 mg/dL — ABNORMAL HIGH (ref 70–99)
Total Protein: 6.3 g/dL (ref 6.0–8.3)

## 2010-11-05 LAB — CBC
MCH: 26.2 pg (ref 26.0–34.0)
MCHC: 32 g/dL (ref 30.0–36.0)
Platelets: 305 10*3/uL (ref 150–400)
RDW: 17.5 % — ABNORMAL HIGH (ref 11.5–15.5)

## 2010-11-05 LAB — GLUCOSE, CAPILLARY
Glucose-Capillary: 158 mg/dL — ABNORMAL HIGH (ref 70–99)
Glucose-Capillary: 115 mg/dL — ABNORMAL HIGH (ref 70–99)

## 2010-11-05 LAB — SURGICAL PCR SCREEN: Staphylococcus aureus: NEGATIVE

## 2010-11-06 LAB — GLUCOSE, CAPILLARY

## 2010-11-08 LAB — POCT URINALYSIS DIP (DEVICE)
Bilirubin Urine: NEGATIVE
Glucose, UA: NEGATIVE mg/dL
Ketones, ur: NEGATIVE mg/dL
Protein, ur: NEGATIVE mg/dL
Specific Gravity, Urine: 1.01 (ref 1.005–1.030)

## 2010-11-08 LAB — URINALYSIS, MICROSCOPIC ONLY
Ketones, ur: NEGATIVE mg/dL
Leukocytes, UA: NEGATIVE
Nitrite: NEGATIVE
Specific Gravity, Urine: 1.018 (ref 1.005–1.030)
pH: 5.5 (ref 5.0–8.0)

## 2010-11-08 LAB — D-DIMER, QUANTITATIVE: D-Dimer, Quant: 0.63 ug/mL-FEU — ABNORMAL HIGH (ref 0.00–0.48)

## 2010-11-08 LAB — POCT I-STAT, CHEM 8
Calcium, Ion: 1.19 mmol/L (ref 1.12–1.32)
Chloride: 96 mEq/L (ref 96–112)
Glucose, Bld: 233 mg/dL — ABNORMAL HIGH (ref 70–99)
HCT: 42 % (ref 36.0–46.0)
Hemoglobin: 14.3 g/dL (ref 12.0–15.0)
Potassium: 4.3 mEq/L (ref 3.5–5.1)

## 2010-11-08 LAB — URINE CULTURE: Colony Count: 45000

## 2010-11-16 NOTE — Op Note (Signed)
[name], [name]                  ACCOUNT NO.:  [id number]  MEDICAL RECORD NO.:  [id number]           PATIENT TYPE:  I  LOCATION:  1660                         FACILITY:  [facility]  PHYSICIAN:  [name], MD     DATE OF BIRTH:  Jul 13, 1951  DATE OF PROCEDURE:  11/05/2010 DATE OF DISCHARGE:                              OPERATIVE REPORT   PREOPERATIVE DIAGNOSES: 1. Frozen left shoulder. 2. Chronic rotator cuff tear. 3. Impingement syndrome. 4. Subscapularis adhesions. 5. Grade 1 SLAP lesion.  POSTOPERATIVE DIAGNOSES: 1. Frozen left shoulder. 2. Chronic rotator cuff tear. 3. Impingement syndrome. 4. Subscapularis adhesions. 5. Grade 1 SLAP lesion.  PROCEDURE: 1. Left shoulder manipulation under anesthesia. 2. Debridement of rotator cuff tear, debridement of SLAP tear,     debridement of subscapularis adhesions. 3. Subacromial decompression.  SURGEON:  [name], MD  ANESTHESIA:  General.  ESTIMATED BLOOD LOSS:  Minimal.ANTIBIOTICS:  Clindamycin 600 mg IV.  DRAINS:  None.  COMPLICATIONS:  None.  DISPOSITION:  To PACU in stable condition.  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  She has failed conservative care, has pain with activities of daily, present at this time for surgical intervention.  Risks and benefits were discussed including infection, neurovascular injury, persistent pain, need for additional surgery.  The patient states she understands and wished proceed at this time.  DESCRIPTION OF PROCEDURE:  The patient was brought to OR room 4 and underwent a general anesthetic.  After adequate level of anesthesia obtained, the patient was placed in a beach-chair position and her left upper extremity was prepped using DuraPrep and draped in the sterile field.  The scope was inserted from the posterior portal and the anterior portal was established with an  outside-in technique with 18- gauge spinal.  Visualization, the patient had a massive retracted rotator cuff tear which was debrided.  She had adhesions of subscapularis.  This was manipulated under anesthesia and the adhesions were lysed.  She also a grade 1 SLAP lesion was also was debrided.  The ArthroCare wand was used for hemostasis.  There was debridement of the SLAP tear, debridement of subscapularis adhesions, debridement of rotator cuff tear.  She also had delaminated cartilage off the humeral head and glenoid.  This was also debrided with a shaver.  There was essentially bone-on-bone contact of the glenohumeral joint.  The scope and the anterior portal instruments were removed.  The scope was then inserted from posterior portal and the subacromial space, and a new lateral portal was established.  This patient had significant amount of bursitis.  This was debrided with the ArthroCare wand as well as the shaver.  An acromionizer was used for subacromial decompression.  She had a hooked type III acromion.  The instruments were removed.  Theportals were closed using 2-0 nylon.  The joint and subacromial space were injected with total of 20 mL of 0.5% Marcaine plain and 4 mg of morphine.  The wounds were covered with Adaptic, orthopedic sponges, ABD dressing, and Hypafix tape.  The patient was placed in a sling, extubated, taken to PACU in stable condition.  Plan for discharge to home.  Prescription for Tylox for pain.     [name], MD     MVD/MEDQ  D:  11/05/2010  T:  11/06/2010  Job:  [id number]  Electronically Signed by Meridee Score MD on 11/16/2010 03:00:44 PM

## 2010-12-02 ENCOUNTER — Encounter: Payer: Self-pay | Admitting: *Deleted

## 2010-12-07 NOTE — Op Note (Signed)
[name], [name]                  ACCOUNT NO.:  [id number]   MEDICAL RECORD NO.:  [id number]          PATIENT TYPE:  INP   LOCATION:  1233                         [facility]ACILITY:  [facility]   PHYSICIAN:  [name], M.D.DATE OF BIRTH:  16-Nov-1950   DATE OF PROCEDURE:  01/19/2009  DATE OF DISCHARGE:                               OPERATIVE REPORT   PREOPERATIVE DIAGNOSIS:  Gastric cancer.   POSTOPERATIVE DIAGNOSES:  Gastric cancer.   PROCEDURE:  Subtotal gastrectomy   SURGEON:  [name], M.D.   ASSISTANT:  Dickey Gave, MD.   ANESTHESIA:  General.   INDICATIONS:  This 60 year old female was having some right upper  quadrant pain and had an extensive evaluation, including an upper  endoscopy.  Endoscopy demonstrated a lesion in the mid to proximal  antrum of the stomach.  This was biopsied and positive for  adenocarcinoma.  She had an endoscopic ultrasound showing no adenopathy  in the area.  She had a CT scan demonstrating no metastatic disease.  She has significant comorbidities and lung disease.  She had seen Dr.  Christinia Gully preoperatively and now she presents for elective  partial/subtotal gastrectomy.   TECHNIQUE:  She was brought to the operating room and placed supine on  the operating table and a general anesthetic was administered.  A Foley  catheter and an NG tube were inserted.  The abdominal wall sterilely  prepped and draped.  An upper midline incision was made through the  skin, subcutaneous tissue, fascia and peritoneum entering the peritoneal  cavity.  I then explored the liver which had some fatty infiltration  changes but no metastatic disease.  There were some adhesions between  the transverse colon and the abdominal wall taken down sharply.   I then entered the lesser sac close to the transverse colon to do an R1  type lymph node dissection.  I began dividing the gastrohepatic  attachments with the Ligasure and in doing some of this  dissection the  mesocolon was divided.  This would be later used for the retrocolic  anastomosis.  A Tattoo mark was noted where that the tumor was.  I  carried the dissection along the greater curvature 6-8 cm above this.   Following this, then I went to the lesser curvature and some of the  gastrohepatic attachments were divided with the Ligasure.  I carried  this down to the level of the first portion of the duodenum.  I did this  on the greater curvature side as well.  I then used the linear cutting  stapler to divide the first portion of the duodenum, thus freeing up the  stomach.  Using the linear cutting stapler, I then divided the stomach  at its proximal third and distal two-thirds area and marked the proximal  margin with a suture and sent it for frozen section analysis of margins  which were negative.   Following this, I examined the duodenal stump and it was solid without  evidence of bleeding.  I then identified the ligament of Treitz and  brought up the  jejunum through the retrocolic mesenteric defect.  In the  remaining posterior stomach, I performed an anastomosis in a Billroth II  fashion using the GIA stapler.  The common defect was closed in two  layers.  The first layer was full-thickness running 3-0 Vicryl suture  and the second layer was interrupted 3-0 silk sutures in a Lembert type  fashion.  A crotch stitch of 3-0 silk was then placed.  The anastomosis  was patent, viable and under no tension.   I then brought it back through the mesenteric defect and closed the  mesenteric defect and anchored the stomach to this to avoid internal  hernia with interrupted 3-0 silk sutures.  NG tube was then positioned  above the anastomosis.   I then inspected the area and hemostasis was adequate.  No bleeding was  noted.  I removed all retractors and sponges.  Instrument count and  sponge count was correct.  I then closed the fascia with a running  double-stranded #1 PDS  suture.  At the end of closure, needle counts  were correct.  The subcutaneous tissues irrigated and the skin was  closed with staples.   She tolerated the procedure well without any apparent complications.  Because of her significant lung disease, she was left intubated and  taken to the intensive care unit intubated and in stable condition.      [name], M.D.  Electronically Signed     TJR/MEDQ  D:  01/19/2009  T:  01/19/2009  Job:  [id number]   cc:   Rudell Cobb. Marin Olp, M.D.  Fax: [phone]   Christena Deem. Melvyn Novas, MD, FCCP  [address]  [place] [address]   Lear Ng, MD  Fax: [phone]   Milus Banister, MD  [address]  [place],  [address]   Inverness Spruill, M.D.  Fax: [phone]

## 2010-12-07 NOTE — Discharge Summary (Signed)
[name], [name]                  ACCOUNT NO.:  [id number]   MEDICAL RECORD NO.:  [id number]          PATIENT TYPE:  INP   LOCATION:  4731                         FACILITY:  [facility]   PHYSICIAN:  Ardyth Gal. Spruill, M.D.DATE OF BIRTH:  04-Jun-1951   DATE OF ADMISSION:  10/14/2007  DATE OF DISCHARGE:  10/20/2007                               DISCHARGE SUMMARY   ADMISSION AND PROVISIONAL DIAGNOSES:  1. Asthma.  2. Dyslipidemia.  3. Morbid obesity.  4. Hypertension.  5. Diabetes mellitus.   DISCHARGE DIAGNOSES:  1. Asthma with status asthmaticus.  2. Total body mass index 40 and over.  3. Sickle cell trait.  4. Dyslipidemia.  5. Hypertension.  6. Mastodynia.  7. Diabetes mellitus.  8. Esophageal reflux.  9. Obstructive sleep apnea.   BRIEF HISTORY AND REASON FOR ADMISSION:  This is a 60 year old white  woman presented to the hospital with a 2-day history of wheezing and  progressive dyspnea.  The patient has a history of diabetes and  hypertension and dyslipidemia, which have been fairly well-controlled.  The patient recently had abdominal surgery in [place] for ruptured  appendix and presented back to hospital on this occasion with severe  dyspnea.  The patient reports that the usual measures at home did not  relieve her shortness of breath.  She was subsequently admitted to the  hospital for further evaluation and treatment for asthma as it did not  improve with conventional measures at home or in the emergency  department.   LABORATORY STUDIES:  The patient had cardiac enzymes performed on October 14, 2007, which revealed myoglobin of 42, CK-MB was less than 1,  prolactin was 16.9, testosterone level was 2.3, total testosterone level  was 25.99.  TSH was 0.172,  luteinizing hormone was 13.6, hemoglobin A1c  9.5, and  B-type natriuretic peptide was 49.   The glomerular filtration rate was greater than 60.  The serum sodium  was 141, potassium 4.1, chloride 104, CO2 content  27, glucose was 314,  BUN 12, creatinine 0.75.  At the time of discharge, the serum sodium was  145, potassium 3.8, chloride 99, CO2 content 38, and glucose 132.  White  blood cell count on admission 9,900, hemoglobin 9.7, and hematocrit  30.3.  At the time of discharge, white blood cell count was 13,200,  hemoglobin 10.2, and hematocrit 31.3.  Arterial blood gas revealed pH of  7.336 and pCO2 was 53.4.   RADIOLOGIC STUDIES:  A chest x-ray revealed borderline cardiomegaly with  mild congestion.  No definite infiltrate.  In addition to this, the  patient's CT angiography of the chest, which was technically adequate  without evidence of pulmonary embolism.  She had mild cardiomegaly.   The electrocardiogram revealed normal sinus rhythm with frequent  premature atrial complexes, left posterior fascicular block.   A 2-D echocardiogram revealed ejection fraction of 55-60% and LV  ventricular wall thickness was mildly increased.  The patient had  calcified aortic valve and it was a trivial aortic valve regurgitation.  There was no pericardial effusion.   HOSPITAL COURSE:  After  admission to the hospital, the patient had  baseline laboratory studies obtained.  The body of which given in the  chart.  She also had vital signs obtained per unit and initial orders  were done by Dr. Dixie Dials.   She was also started on Rocephin 1 gm IV daily, given prednisone 30 mg  p.o. daily, Avandia, metformin, glimepiride, Janumet, simvastatin  Singulair, and a sliding-scale insulin coverage.  In addition to this,  the patient had been started on breathing treatments and her CPAP  machine was ordered on initial presentation.   The patient responded very well to control of her diabetes and was  eventually started on Lantus insulin 50 units subcu daily.  Her dyspnea  gradually began to improve with intensive pulmonary therapy and her  breathing treatments were adjusted.  She improved with the use of   steroids for status asthmaticus.  She had a B natriuretic peptide  obtained, which did not reveal any evidence of heart failure noted.  She  did not have any evidence of heart failure prior 2-D echocardiography.  Eventually, her overall condition improved.  She was given several doses  of IV Lasix and eventually started on several court Symbicort 160/4.5  two puffs b.i.d., Ventolin 2 puffs every 4 hours and was evaluated by  pulmonologist and she appeared not to improve initially.  Eventually,  her prednisone was decreased to 10 mg p.o. daily.  An additional studies  including thyroid function studies and prolactin levels, LH hormone, and  other studies were obtained.   On October 18, 2007, the patient's IV Rocephin was discontinued.  She was  placed on Ceftin 250 mg by mouth twice daily and she began to ambulate  quite well without any severe difficulty breathing.  On March 28. 2009,  plans for home health were made and the patient was started on home O2  at 2 L per minute and she was subsequently discharged home.   DISCHARGE MEDICATIONS:  Included:  1. Janumet 50/1000 one p.o. b.i.d.  2. Simvastatin 40 mg p.o. daily.  3. Prilosec 40 mg p.o. daily.  4. Carvedilol 12.5 mg p.o. b.i.d.  5. Avandia 4 mg p.o. q.a.m.  6. Singulair 2 mg p.o. q.a.m.  7. Albuterol 2 puffs q.i.d.  8. Atrovent 2 puffs q.i.d.  9. Lantus 50 units subcu daily.  10.Symbicort 160/4.5 two puffs b.i.d.  11.Ceftin 250 mg p.o. b.i.d. x5 days.  12.Lasix 20 mg p.o. b.i.d.  13.Ultram 50 mg p.o. every 4-6 hours as needed.   DISCHARGE DIET:  Consist of 1800-calorie ADA-type diet.  She will be  seen back in the office in 2 weeks for additional followup.   In brief summary, this is a 60 year old white woman who was admitted  with status asthmaticus.  She was treated promptly after the  consultation with pulmonary was obtained.  She was released with less  dyspnea and will be seen by me in the office in 2 weeks.       Ardyth Gal. Spruill, M.D.  Electronically Signed     JOS/MEDQ  D:  11/28/2007  T:  11/29/2007  Job:  [id number]

## 2010-12-07 NOTE — Discharge Summary (Signed)
NAMEDESHANNA, [name]                  ACCOUNT NO.:  [id number]   MEDICAL RECORD NO.:  [id number]          PATIENT TYPE:  INP   LOCATION:  5511                         FACILITY:  [facility]   PHYSICIAN:  Ardyth Gal. Spruill, M.D.DATE OF BIRTH:  01/26/1951   DATE OF ADMISSION:  12/06/2007  DATE OF DISCHARGE:  12/11/2007                               DISCHARGE SUMMARY   DISCHARGE DIAGNOSES:  1. Chronic obstructive asthma with status asthmaticus.  2. Body mass index 40 or over.  3. Hypertension.  4. Morbid obesity.  5. Obstructive sleep apnea.  6. Esophageal reflux.   [name] is a 60 year old patient who presented to the [facility] on Dec 06, 2007.  It is of note that [name] has a history of  asthma and hypertension.  She was recently discharged from the hospital  due to asthma and uncontrolled hypertension as well as diabetes  mellitus.  The patient underwent an asthma attack that was not improved  by albuterol.  She was seen in the emergency department.  She continued  to have nebulizer treatments without significant improvement.  On  examination, the patient is found to be an obese female in moderate  distress with bilateral wheezes and decreased air movement.  The patient  was subsequently admitted for status asthmaticus.   HOSPITAL COURSE:  The history and physical as well as the daily hospital  visits documentation completed by Dr. Wallene Huh.   On Dec 08, 2007, the patient was placed on CPAP with some improvement.  The patient was seen by Pulmonary in [facility]. It was  their opinion that the patient should have a rapid steroid taper, strict  management of the reflux disease and allergic rhinitis, and also that  the patient should have a pulmonary function study.   The patient was also seen by the nutrition team to assist with calorie  needs.   On Dec 08, 2007, the patient continued to show some improvement.  It is  of note that the patient was  seen by pharmacy for Lovenox consultation  and DVT prophylaxis.   The patient had a pulmonary function study, which revealed moderately  severe airway obstruction present, the reduced lung volume, increased  FEV-1 and FVC ratio and diffusion defect suggested and interstitial  process such as fibrosis or interstitial inflammation.   The patient also underwent a Persantine stress test, which was negative  for acute ischemic problems.   On Dec 11, 2007, it was the opinion that the patient had received  maximum benefit from this hospitalization and could be discharged home.   DISCHARGE MEDICATIONS:  1. Omeprazole 40 mg daily.  2. Janumet 50/1000 one b.i.d.  3. Tekturna 300 mg daily.  4. Singulair 10 mg daily.  5. Aspirin 80 mg daily.  6. Benzonatate 100 mg 2 tablets 3 times a day.  7. Carvedilol 12.5 mg b.i.d.  8. Percocet 10/650 one every 4-6 hours as needed.  9. Simvastatin 40 mg daily.  10.Avandia 4 mg twice a day.  11.Zithromax 1 daily for 4 days.  12.Prednisone as directed.  13.Nebulizer treatment with albuterol as directed.  14.Home O2 at 2 liters per minute.  15.Symbicort 160/4.5 two puffs b.i.d.   The patient will be seen in the office in 1 week or sooner if any  changes, problems, or concerns.      Lily Kocher, P.A.      Ardyth Gal. Spruill, M.D.  Electronically Signed    HB/MEDQ  D:  01/08/2008  T:  01/09/2008  Job:  [id number]

## 2010-12-07 NOTE — Op Note (Signed)
NAMEALEXZIA, [name]                  ACCOUNT NO.:  [id number]   MEDICAL RECORD NO.:  [id number]          PATIENT TYPE:  AMB   LOCATION:  ENDO                         [facility]ACILITY:  [facility]   PHYSICIAN:  Lear Ng, MDDATE OF BIRTH:  July 24, 1951   DATE OF PROCEDURE:  DATE OF DISCHARGE:                               OPERATIVE REPORT   PROCEDURE:  Colonoscopy.   INDICATIONS:  History of colon polyps.   MEDICATIONS:  Propofol infusion per Anesthesia, the patient was  electively intubated prior to procedure by Anesthesia.   FINDINGS:  Rectal exam was normal.  A pediatric Pentax colonoscope was  inserted through a fair-prepped colon and advanced to the cecum, where  the ileocecal valve and appendiceal orifice were identified.  In order  to reach the cecum, abdominal pressure and repeated loop reduction was  necessary.  On careful withdrawal of the colonoscope, two small sessile  polyps were removed.  Specifically a 5-mm sessile polyp was removed from  the transverse colon with snare cautery.  A 3-mm sessile polyp was  removed with snare cautery from the sigmoid colon.  There were scattered  small diverticula seen in the sigmoid colon.  Retroflexion revealed  small internal hemorrhoids.   ASSESSMENT:  1. Colonic colon polyps x2, status post snare cautery x2.  2. Sigmoid diverticulosis.  3. Internal hemorrhoids.   PLAN:  1. Follow up on path.  2. No aspirin products x14 days.      Lear Ng, MD  Electronically Signed     VCS/MEDQ  D:  11/20/2008  T:  11/20/2008  Job:  [id number]   cc:   Ardyth Gal. Spruill, M.D.  Fax: [phone]

## 2010-12-07 NOTE — Op Note (Signed)
[name], [name]                  ACCOUNT NO.:  [id number]   MEDICAL RECORD NO.:  [id number]          PATIENT TYPE:  AMB   LOCATION:  ENDO                         FACILITY:  [facility]   PHYSICIAN:  Lear Ng, MDDATE OF BIRTH:  12-06-1950   DATE OF PROCEDURE:  11/20/2008  DATE OF DISCHARGE:                               OPERATIVE REPORT   PROCEDURE:  Upper endoscopy.   ENDOSCOPIST:  Lear Ng, M.D.   INDICATIONS:  Heartburn, abdominal pain.   MEDICATIONS:  Propofol infusion per anesthesia.  The patient was  electively intubated by anesthesia prior to the procedures.   DESCRIPTION OF PROCEDURE/FINDINGS:  The endoscope was inserted through  the oropharynx.  The esophagus was intubated, which was normal in its  entirety.  The scope was passed down to the stomach which revealed a  nodular ulcerated focal area on the lesser curvature of the stomach.  The remaining part of the stomach mucosa was normal in appearance.  Retroflexion was done which revealed a normal proximal stomach.  The  endoscope was straightened and advanced to the duodenum, bulb and second  portion of duodenum which were both normal.  The endoscope was withdrawn  back up into the stomach and biopsies were taken of this ulcerated  nodular mucosa for histologic purposes.  The endoscope was withdrawn to  confirm the above findings.   ASSESSMENT:  1. Gastric ulcer with nodular mucosa, status post biopsy to rule out      adenocarcinoma.  2. Otherwise normal upper endoscopy.   PLAN:  1. Follow up on path.  2. Maintain anti-reflux medicines and regimen.  3. Avoid NSAIDs.      Lear Ng, MD  Electronically Signed     VCS/MEDQ  D:  11/20/2008  T:  11/20/2008  Job:  [id number]   cc:   Ardyth Gal. Spruill, M.D.  Fax: [phone]

## 2010-12-07 NOTE — Discharge Summary (Signed)
[name], [name]                  ACCOUNT NO.:  [id number]   MEDICAL RECORD NO.:  [id number]          PATIENT TYPE:  INP   LOCATION:  1513                         FACILITY:  [facility]   PHYSICIAN:  [name], M.D.DATE OF BIRTH:  09-26-1950   DATE OF ADMISSION:  01/19/2009  DATE OF DISCHARGE:  01/26/2009                               DISCHARGE SUMMARY   FINAL DIAGNOSIS:  Stage III gastric cancer.   SECONDARY DIAGNOSES:  1. Morbid obesity.  2. Hypertension.  3. Insulin-dependent diabetes mellitus.  4. Asthma with restrictive lung disease.  5. Postoperative ileus.  6. Acute blood loss anemia.   PROCEDURE:  Subtotal gastrectomy January 19, 2009.   REASON FOR ADMISSION:  This 60 year old female was found to have a  gastric cancer upon upper endoscopy for workup of abdominal pain.  CT  scan did not demonstrate any evidence of metastatic disease.  She  subsequently was admitted for a partial gastrectomy.   HOSPITAL COURSE:  She went on with the subtotal gastrectomy and was  taken into the Intensive Care Unit.   CONSULTATION:  Dr. Asencion Noble of pulmonary/CC was obtained.  She was  placed on a sliding scale insulin.  She was able to be extubated.  She  started on pulmonary treatments.  NG tube was left in.  Hemoglobin did  drop to 10.2 but she remained hemodynamically stable with good urine  output.  She was transferred to step-down on her second postoperative  day.  Pathology came back as a T1, N1 gastric cancer with 2  lymph nodes  positive.  This was discussed with her.  I started her on Lantus insulin  as her blood sugars were above 200 most of time and adjusted this dose  to get them below 200.  Her NG tube, eventually, was able to be removed  and she was started on a diet and diet was advanced.  By her 7th  postoperative day, she is tolerating a diet.  Her lung disease was  stable.  Her wound was clean and she was able to discharged.   DISPOSITION:  Discharged to home  January 26, 2009.  She was in satisfactory  condition.  She was given dietary instructions and activity  restrictions.  She is also given medicine for pain and will follow up  with me in the office in 2-3 weeks.  She was also told to follow up with  Dr. Marin Olp in approximately 4 weeks.  Continue all of her pre-  hospitalization medications.      [name], M.D.  Electronically Signed     TJR/MEDQ  D:  02/04/2009  T:  02/04/2009  Job:  [id number]   cc:   Rudell Cobb. Marin Olp, M.D.  Fax: [phone]   Burnett Harry Joya Gaskins, MD, FCCP  [address]  [place]  [place] [address]   Lear Ng, MD  Fax: [phone]   Christena Deem. Melvyn Novas, MD, FCCP  [address] [address]   Daniel P. Jacobs, MD  [address]  [place], [place] [address]   Awanda Mink  Darrold Span, M.D.  Fax: [phone]

## 2010-12-10 ENCOUNTER — Other Ambulatory Visit: Payer: Self-pay | Admitting: Hematology & Oncology

## 2010-12-10 ENCOUNTER — Encounter (HOSPITAL_BASED_OUTPATIENT_CLINIC_OR_DEPARTMENT_OTHER): Payer: Medicare Other | Admitting: Hematology & Oncology

## 2010-12-10 DIAGNOSIS — I10 Essential (primary) hypertension: Secondary | ICD-10-CM

## 2010-12-10 DIAGNOSIS — Z794 Long term (current) use of insulin: Secondary | ICD-10-CM

## 2010-12-10 DIAGNOSIS — E119 Type 2 diabetes mellitus without complications: Secondary | ICD-10-CM

## 2010-12-10 DIAGNOSIS — Z452 Encounter for adjustment and management of vascular access device: Secondary | ICD-10-CM

## 2010-12-10 DIAGNOSIS — C165 Malignant neoplasm of lesser curvature of stomach, unspecified: Secondary | ICD-10-CM

## 2010-12-10 LAB — RETICULOCYTES (CHCC)
ABS Retic: 41.1 10*3/uL (ref 19.0–186.0)
Retic Ct Pct: 0.9 % (ref 0.4–3.1)

## 2010-12-10 LAB — CBC WITH DIFFERENTIAL (CANCER CENTER ONLY)
Eosinophils Absolute: 0.3 10*3/uL (ref 0.0–0.5)
HGB: 11.9 g/dL (ref 11.6–15.9)
LYMPH%: 31.5 % (ref 14.0–48.0)
MCV: 80 fL — ABNORMAL LOW (ref 81–101)
MONO#: 0.7 10*3/uL (ref 0.1–0.9)
NEUT#: 2.9 10*3/uL (ref 1.5–6.5)
Platelets: 284 10*3/uL (ref 145–400)
RBC: 4.49 10*6/uL (ref 3.70–5.32)
WBC: 5.7 10*3/uL (ref 3.9–10.0)

## 2010-12-10 LAB — COMPREHENSIVE METABOLIC PANEL
ALT: 12 U/L (ref 0–35)
Alkaline Phosphatase: 89 U/L (ref 39–117)
Sodium: 142 mEq/L (ref 135–145)
Total Bilirubin: 0.4 mg/dL (ref 0.3–1.2)
Total Protein: 6.5 g/dL (ref 6.0–8.3)

## 2010-12-10 LAB — IRON AND TIBC: %SAT: 15 % — ABNORMAL LOW (ref 20–55)

## 2010-12-10 LAB — CHCC SATELLITE - SMEAR

## 2010-12-10 NOTE — Procedures (Signed)
[name], SCHNITZLER                  ACCOUNT NO.:  [id number]   MEDICAL RECORD NO.:  [id number]          PATIENT TYPE:  OUT   LOCATION:  SLEEP CENTER                 [facility]ACILITY[facility]   PHYSICIAN:  Kathee Delton, MD,FCCPDATE OF BIRTH:  Jan 23, 1951   DATE OF STUDY:                            NOCTURNAL POLYSOMNOGRAM   REFERRING PHYSICIAN:  Legrand Como B. Wert, MD, FCCP   INDICATION FOR THE STUDY:  Hypersomnia with sleep apnea.  The patient  returns for CPAP pressure optimization.   EPWORTH SCORE:  16.   SLEEP ARCHITECTURE:  Patient had a total sleep time of 307 minutes with  no slow-wave sleep and decreased REM.  Sleep onset latency was rapid at  13 minutes and REM onset was normal at 104 minutes.  Sleep efficiency  was decreased 80%.   RESPIRATORY DATA:  The patient underwent CPAP titration with a large  Respironics Comfort Gel Full Facemask.  The pressure was increased  incrementally in order to control both snoring and obstructive events.  At a CPAP pressure of 15 cm of water, there was elimination of the  patient's obstructive events, however, the pressure was increased to a  final value of 17 cm of water in order to totally eliminate snoring.   OXYGEN DATA:  The patient was placed on 2 L per minute of oxygen through  her CPAP machine at the start of the study.  She was found to have  adequate O2 saturations on the 2 L at an optimal CPAP pressure.  There  was O2 desaturation as low as 73% prior to reaching the final CPAP  pressure.   CARDIAC DATA:  No cardiac arrhythmias were noted.   MOVEMENT-PARASOMNIA:  The patient was found to have small numbers of leg  jerks that did not appear to be clinically significant.   IMPRESSION/RECOMMENDATION:  1. Good control of previously documented obstructive sleep apnea with      a CPAP pressure of 15 to 17 cm of water delivered by a large      Respironics Comfort Gel Full Facemask.  I would start with a      treatment pressure of 16 cm and  see how the patient responds.  2. Adequate O2 saturations were achieved with 2 L of supplemental      oxygen through the CPAP machine at its optimal pressure.      Kathee Delton, MD,FCCP  [place], [place] [employer]  Electronically Signed     KMC/MEDQ  D:  01/15/2008 16:28:20  T:  01/15/2008 16:47:15  Job:  [id number]

## 2011-01-05 ENCOUNTER — Other Ambulatory Visit: Payer: Self-pay | Admitting: Hematology & Oncology

## 2011-01-05 ENCOUNTER — Encounter (HOSPITAL_BASED_OUTPATIENT_CLINIC_OR_DEPARTMENT_OTHER): Payer: Medicare Other | Admitting: Hematology & Oncology

## 2011-01-05 DIAGNOSIS — E119 Type 2 diabetes mellitus without complications: Secondary | ICD-10-CM

## 2011-01-05 DIAGNOSIS — R5381 Other malaise: Secondary | ICD-10-CM

## 2011-01-05 DIAGNOSIS — C161 Malignant neoplasm of fundus of stomach: Secondary | ICD-10-CM

## 2011-01-05 DIAGNOSIS — I10 Essential (primary) hypertension: Secondary | ICD-10-CM

## 2011-01-05 DIAGNOSIS — C165 Malignant neoplasm of lesser curvature of stomach, unspecified: Secondary | ICD-10-CM

## 2011-01-05 DIAGNOSIS — R5383 Other fatigue: Secondary | ICD-10-CM

## 2011-01-05 LAB — COMPREHENSIVE METABOLIC PANEL
ALT: 12 U/L (ref 0–35)
AST: 14 U/L (ref 0–37)
Albumin: 4.1 g/dL (ref 3.5–5.2)
BUN: 11 mg/dL (ref 6–23)
CO2: 28 mEq/L (ref 19–32)
Calcium: 9.6 mg/dL (ref 8.4–10.5)
Chloride: 105 mEq/L (ref 96–112)
Creatinine, Ser: 0.78 mg/dL (ref 0.50–1.10)
Potassium: 4.1 mEq/L (ref 3.5–5.3)

## 2011-01-05 LAB — CBC WITH DIFFERENTIAL (CANCER CENTER ONLY)
BASO#: 0 10*3/uL (ref 0.0–0.2)
BASO%: 0.3 % (ref 0.0–2.0)
HCT: 36.1 % (ref 34.8–46.6)
HGB: 12 g/dL (ref 11.6–15.9)
LYMPH#: 1.7 10*3/uL (ref 0.9–3.3)
MONO#: 0.6 10*3/uL (ref 0.1–0.9)
NEUT%: 55.9 % (ref 39.6–80.0)
RDW: 16.8 % — ABNORMAL HIGH (ref 11.1–15.7)
WBC: 5.9 10*3/uL (ref 3.9–10.0)

## 2011-01-05 LAB — IRON AND TIBC
%SAT: 20 % (ref 20–55)
Iron: 70 ug/dL (ref 42–145)
TIBC: 357 ug/dL (ref 250–470)
UIBC: 287 ug/dL

## 2011-01-05 LAB — FERRITIN: Ferritin: 30 ng/mL (ref 10–291)

## 2011-01-05 LAB — LIPID PANEL
Cholesterol: 185 mg/dL (ref 0–200)
HDL: 47 mg/dL (ref >39)
Triglycerides: 132 mg/dL (ref <150)
VLDL: 26 mg/dL (ref 0–40)

## 2011-01-05 LAB — TSH: TSH: 3.155 u[IU]/mL (ref 0.350–4.500)

## 2011-01-05 LAB — RETICULOCYTES (CHCC): Retic Ct Pct: 1.7 % (ref 0.4–3.1)

## 2011-02-02 IMAGING — CT CT CHEST W/ CM
2 of 5 series · 16 of 46 positions shown, 18 images · IV contrast (agent unspecified)
Comparison: 07/13/2009

CT CHEST

CLINICAL DATA: Gastric cancer.  Shortness of breath.

CT CHEST, ABDOMEN AND PELVIS WITH CONTRAST
TECHNIQUE: Multidetector CT imaging of the chest, abdomen and
pelvis was performed following the standard protocol during bolus
administration of intravenous contrast.
Contrast: 100 ml [id number]

[Series 2: cap xxl st · axial · 0.77mm/px · z∈[-574,-49]mm · 13 of 119 slices shown, 15 images]
[im 7/119  soft-tissue]
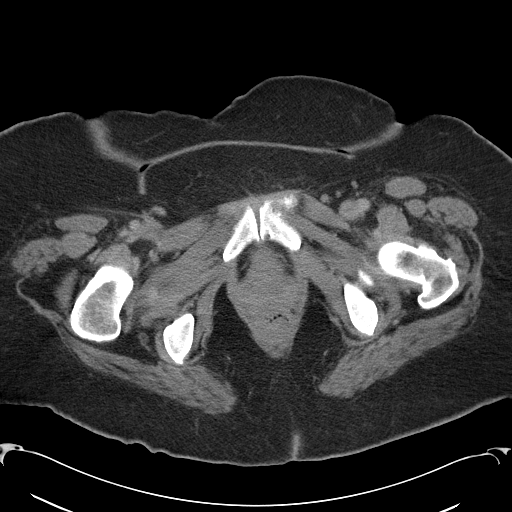
[im 7/119  bone]
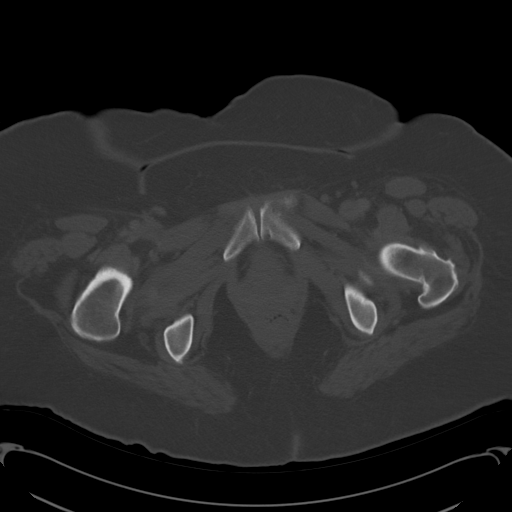
[im 14/119  soft-tissue]
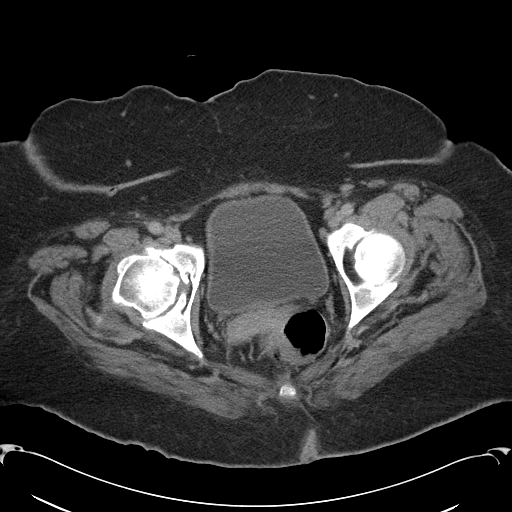
[im 28/119  soft-tissue]
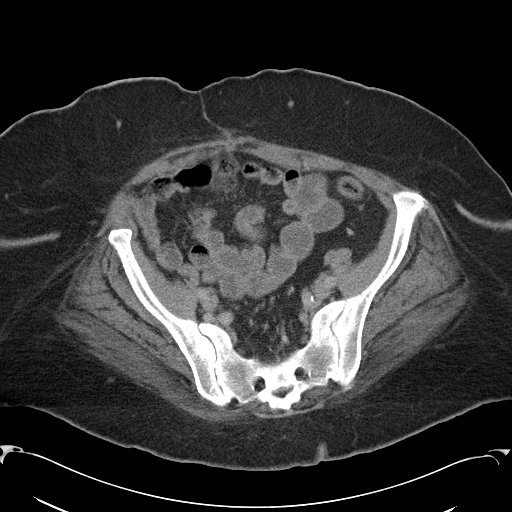
[im 35/119  soft-tissue]
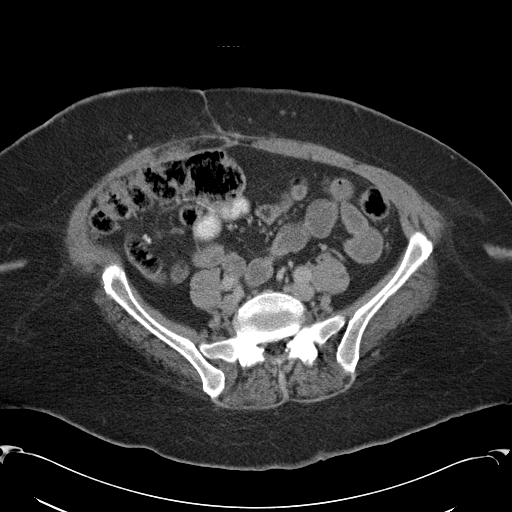
[im 42/119  soft-tissue]
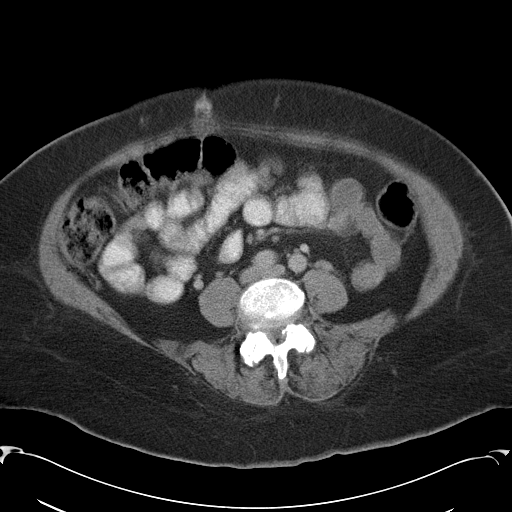
[im 49/119  soft-tissue]
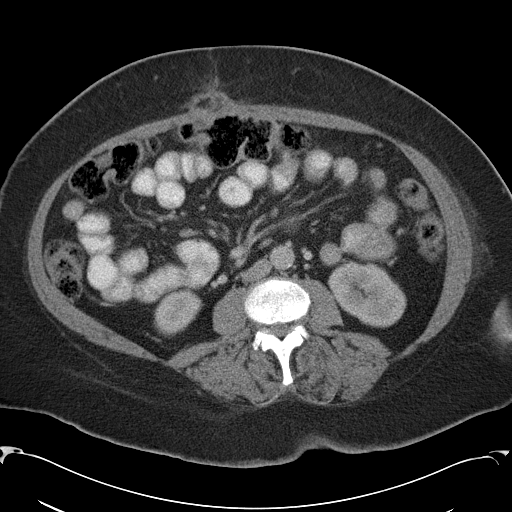
[im 63/119  soft-tissue]
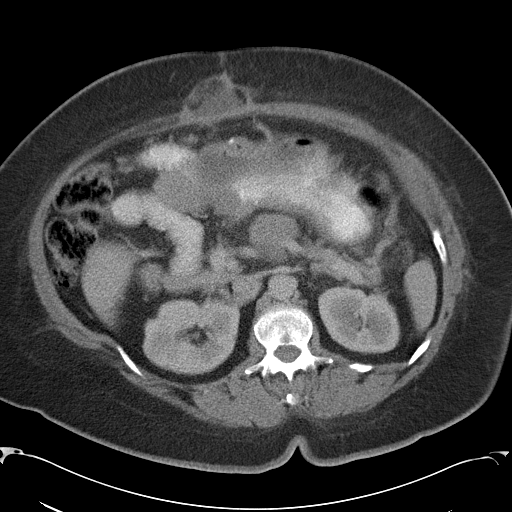
[im 70/119  soft-tissue]
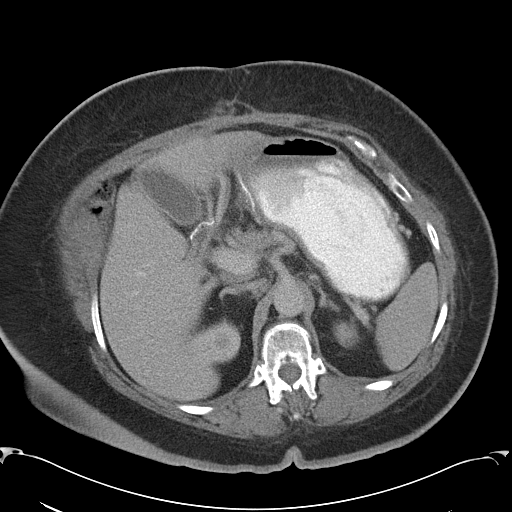
[im 77/119  soft-tissue]
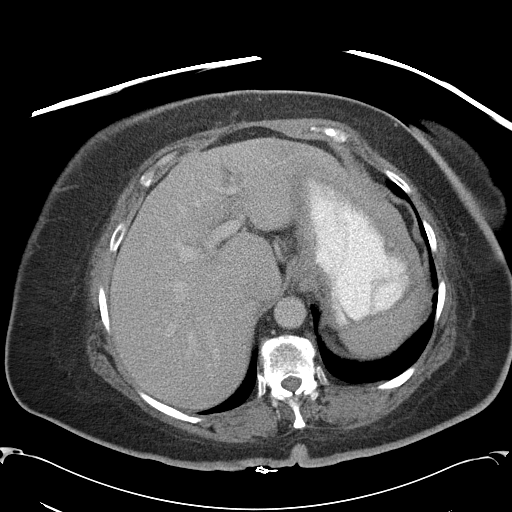
[im 77/119  bone]
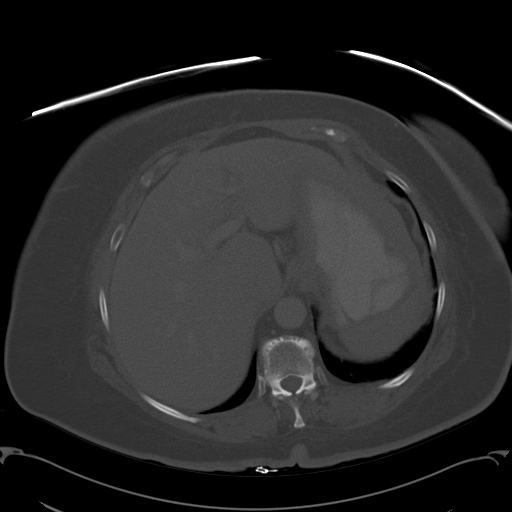
[im 84/119  soft-tissue]
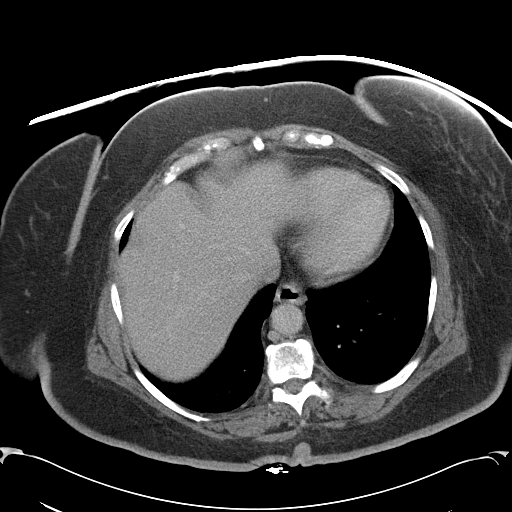
[im 91/119  soft-tissue]
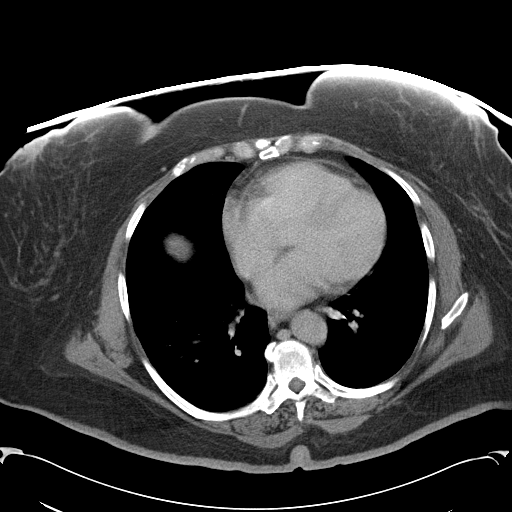
[im 105/119  soft-tissue]
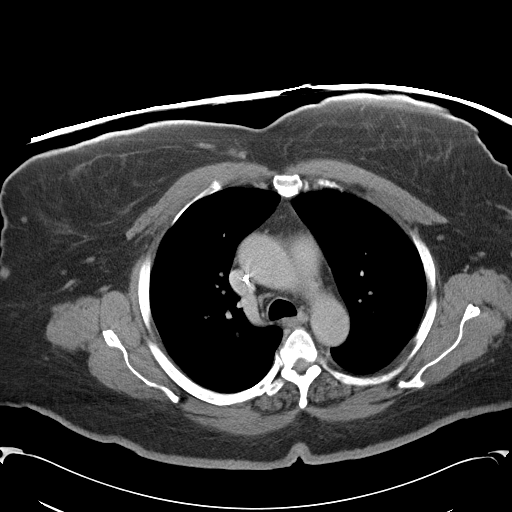
[im 112/119  soft-tissue]
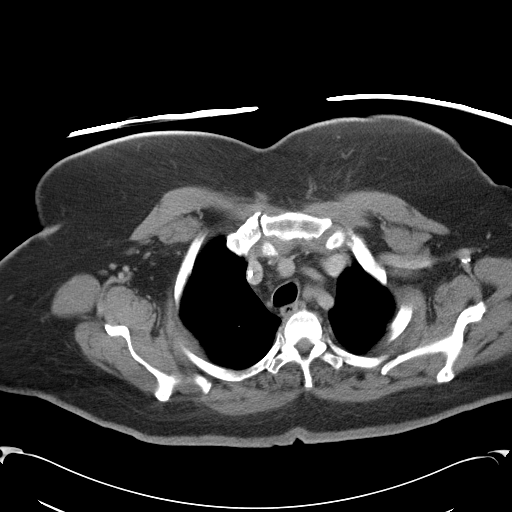

[Series 602: <mpr thick range> · coronal · 1.16mm/px · 3 of 94 slices shown]
[im 32/94  soft-tissue]
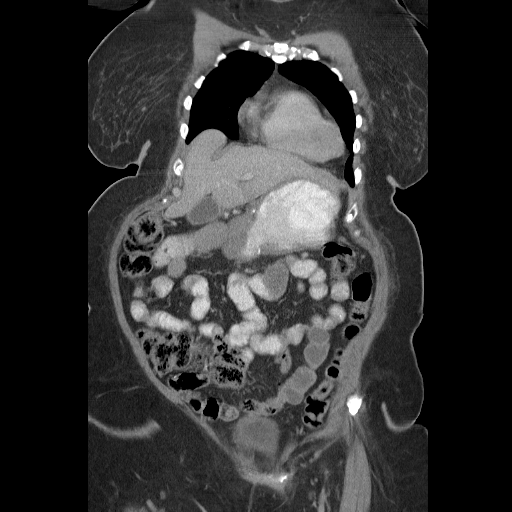
[im 42/94  soft-tissue]
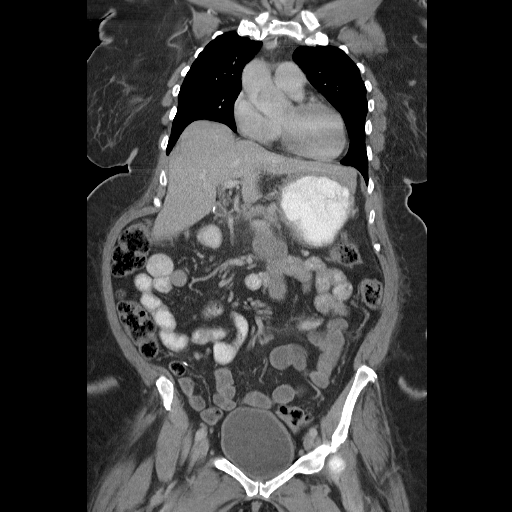
[im 52/94  soft-tissue]
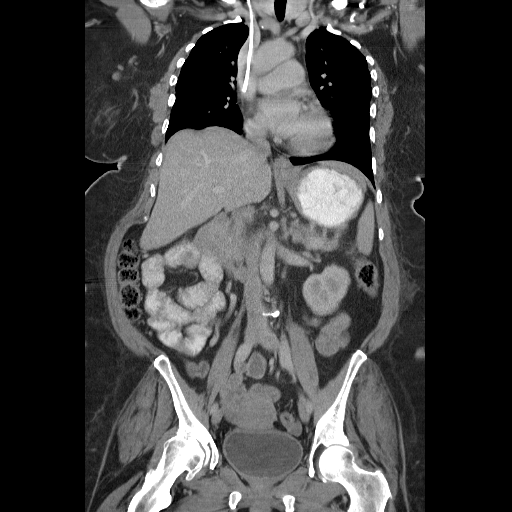

[16 of 46 positions shown; findings below may reference images not displayed]

FINDINGS: A low attenuation nodule in the right thyroid is
incompletely imaged.  There is an exophytic nodule in the left
thyroid, measuring 1.7 cm, stable.  No pathologically enlarged
mediastinal, hilar or axillary lymph nodes.  Pulmonary arteries are
enlarged.  Heart is at the upper limits of normal in size.  No
pericardial effusion.

Probable subpleural atelectasis in the posterior segment right
upper lobe, along the major fissure.  Subsegmental atelectasis in
the left lower lobe.  No pleural fluid.  Airway is unremarkable.
IMPRESSION: 1.  No evidence of metastatic disease in the chest.
2.  Pulmonary arterial hypertension.
3.  Nodular thyroid.

CT ABDOMEN AND PELVIS
FINDINGS: An 8 mm low attenuation lesion in the left hepatic lobe
is not well seen on prior studies.  Liver, gallbladder, and adrenal
glands are otherwise unremarkable.  Scattered areas of renal
cortical scarring are seen bilaterally.  Spleen and pancreas are
unremarkable.

There are postoperative changes in the distal stomach, with dilated
and fluid-filled small bowel beyond the anastomosis.  There appears
to be gradual tapering to normal caliber small bowel, with passage
of oral contrast into the majority of the distal small bowel.
Colon is unremarkable.  Uterus and ovaries are visualized.  No free
fluid. A ventral incisional hernia contains a small amount of
fluid, stable.  No pathologically enlarged lymph nodes.  No
worrisome lytic or sclerotic lesions.  Advanced degenerative
changes are seen in the right hip.
IMPRESSION: 1.  Postoperative changes of partial gastrectomy and
gastrojejunostomy.  The proximal jejunum appears mildly dilated and
fluid-filled, without discrete transition point.
2.  Small ventral incisional hernia contains fluid, stable.

## 2011-03-01 ENCOUNTER — Emergency Department (HOSPITAL_COMMUNITY)
Admission: EM | Admit: 2011-03-01 | Discharge: 2011-03-01 | Disposition: A | Discharge status: Home / Self Care | Payer: Medicare Other | Attending: Emergency Medicine | Admitting: Emergency Medicine

## 2011-03-01 ENCOUNTER — Emergency Department (HOSPITAL_COMMUNITY): Payer: Medicare Other

## 2011-03-01 DIAGNOSIS — Z79899 Other long term (current) drug therapy: Secondary | ICD-10-CM | POA: Insufficient documentation

## 2011-03-01 DIAGNOSIS — R1011 Right upper quadrant pain: Secondary | ICD-10-CM | POA: Insufficient documentation

## 2011-03-01 DIAGNOSIS — K219 Gastro-esophageal reflux disease without esophagitis: Secondary | ICD-10-CM | POA: Insufficient documentation

## 2011-03-01 DIAGNOSIS — R079 Chest pain, unspecified: Secondary | ICD-10-CM | POA: Insufficient documentation

## 2011-03-01 DIAGNOSIS — R0602 Shortness of breath: Secondary | ICD-10-CM | POA: Insufficient documentation

## 2011-03-01 DIAGNOSIS — E119 Type 2 diabetes mellitus without complications: Secondary | ICD-10-CM | POA: Insufficient documentation

## 2011-03-01 LAB — LIPASE, BLOOD: Lipase: 8 U/L — ABNORMAL LOW (ref 11–59)

## 2011-03-01 LAB — CBC
HCT: 36.7 % (ref 36.0–46.0)
Hemoglobin: 11.6 g/dL — ABNORMAL LOW (ref 12.0–15.0)
MCH: 26.1 pg (ref 26.0–34.0)
MCV: 82.5 fL (ref 78.0–100.0)
RBC: 4.45 MIL/uL (ref 3.87–5.11)
WBC: 6.6 10*3/uL (ref 4.0–10.5)

## 2011-03-01 LAB — DIFFERENTIAL
Eosinophils Absolute: 0.2 10*3/uL (ref 0.0–0.7)
Lymphocytes Relative: 45 % (ref 12–46)
Lymphs Abs: 3 10*3/uL (ref 0.7–4.0)
Monocytes Relative: 12 % (ref 3–12)
Neutro Abs: 2.6 10*3/uL (ref 1.7–7.7)
Neutrophils Relative %: 39 % — ABNORMAL LOW (ref 43–77)

## 2011-03-01 LAB — COMPREHENSIVE METABOLIC PANEL
ALT: 13 U/L (ref 0–35)
Alkaline Phosphatase: 92 U/L (ref 39–117)
BUN: 10 mg/dL (ref 6–23)
CO2: 25 mEq/L (ref 19–32)
Chloride: 102 mEq/L (ref 96–112)
GFR calc Af Amer: >60 mL/min (ref >60)
Glucose, Bld: 245 mg/dL — ABNORMAL HIGH (ref 70–99)
Potassium: 3.5 mEq/L (ref 3.5–5.1)
Sodium: 139 mEq/L (ref 135–145)
Total Bilirubin: 0.4 mg/dL (ref 0.3–1.2)
Total Protein: 7.2 g/dL (ref 6.0–8.3)

## 2011-03-01 LAB — CK TOTAL AND CKMB (NOT AT ARMC): Relative Index: INVALID (ref 0.0–2.5)

## 2011-03-04 ENCOUNTER — Ambulatory Visit (HOSPITAL_COMMUNITY)
Admission: RE | Admit: 2011-03-04 | Discharge: 2011-03-04 | Disposition: A | Discharge status: Home / Self Care | Payer: Medicare Other | Source: Ambulatory Visit | Attending: Hematology & Oncology | Admitting: Hematology & Oncology

## 2011-03-04 DIAGNOSIS — K7689 Other specified diseases of liver: Secondary | ICD-10-CM | POA: Insufficient documentation

## 2011-03-04 DIAGNOSIS — Z96649 Presence of unspecified artificial hip joint: Secondary | ICD-10-CM | POA: Insufficient documentation

## 2011-03-04 DIAGNOSIS — K5730 Diverticulosis of large intestine without perforation or abscess without bleeding: Secondary | ICD-10-CM | POA: Insufficient documentation

## 2011-03-04 DIAGNOSIS — M129 Arthropathy, unspecified: Secondary | ICD-10-CM | POA: Insufficient documentation

## 2011-03-04 DIAGNOSIS — E042 Nontoxic multinodular goiter: Secondary | ICD-10-CM | POA: Insufficient documentation

## 2011-03-04 DIAGNOSIS — C161 Malignant neoplasm of fundus of stomach: Secondary | ICD-10-CM | POA: Insufficient documentation

## 2011-03-11 ENCOUNTER — Other Ambulatory Visit: Payer: Self-pay | Admitting: Hematology & Oncology

## 2011-03-11 ENCOUNTER — Encounter (HOSPITAL_BASED_OUTPATIENT_CLINIC_OR_DEPARTMENT_OTHER): Payer: Medicare Other | Admitting: Hematology & Oncology

## 2011-03-11 DIAGNOSIS — C165 Malignant neoplasm of lesser curvature of stomach, unspecified: Secondary | ICD-10-CM

## 2011-03-11 DIAGNOSIS — Z452 Encounter for adjustment and management of vascular access device: Secondary | ICD-10-CM

## 2011-03-11 DIAGNOSIS — I10 Essential (primary) hypertension: Secondary | ICD-10-CM

## 2011-03-11 DIAGNOSIS — E119 Type 2 diabetes mellitus without complications: Secondary | ICD-10-CM

## 2011-03-11 DIAGNOSIS — E1169 Type 2 diabetes mellitus with other specified complication: Secondary | ICD-10-CM

## 2011-03-11 LAB — CBC WITH DIFFERENTIAL (CANCER CENTER ONLY)
BASO#: 0 10*3/uL (ref 0.0–0.2)
Eosinophils Absolute: 0.3 10*3/uL (ref 0.0–0.5)
HGB: 12.5 g/dL (ref 11.6–15.9)
LYMPH%: 29.4 % (ref 14.0–48.0)
MCH: 27.4 pg (ref 26.0–34.0)
MCV: 80 fL — ABNORMAL LOW (ref 81–101)
MONO#: 0.6 10*3/uL (ref 0.1–0.9)
MONO%: 10 % (ref 0.0–13.0)
NEUT#: 3.6 10*3/uL (ref 1.5–6.5)
Platelets: 318 10*3/uL (ref 145–400)
RBC: 4.57 10*6/uL (ref 3.70–5.32)
WBC: 6.4 10*3/uL (ref 3.9–10.0)

## 2011-03-11 LAB — IRON AND TIBC
Iron: 67 ug/dL (ref 42–145)
TIBC: 346 ug/dL (ref 250–470)
UIBC: 279 ug/dL

## 2011-03-11 LAB — COMPREHENSIVE METABOLIC PANEL
Albumin: 4.1 g/dL (ref 3.5–5.2)
BUN: 9 mg/dL (ref 6–23)
CO2: 27 mEq/L (ref 19–32)
Calcium: 9.6 mg/dL (ref 8.4–10.5)
Glucose, Bld: 214 mg/dL — ABNORMAL HIGH (ref 70–99)
Potassium: 4.2 mEq/L (ref 3.5–5.3)
Sodium: 141 mEq/L (ref 135–145)
Total Protein: 6.7 g/dL (ref 6.0–8.3)

## 2011-04-05 IMAGING — US US SOFT TISSUE HEAD/NECK
1 series · 13 of 25 positions shown · non-contrast
Comparison: Thyroid nuclear medicine scan of 11/24/2009 and CT neck
of 06/25/2009

CLINICAL DATA: Possible cold nodule in the right lobe on thyroid
nuclear medicine scan of 11/24/2009

THYROID ULTRASOUND
TECHNIQUE: Ultrasound examination of the thyroid gland and
adjacent soft tissues was performed.

[Series 1: us soft tissue head/neck · 0.10mm/px · 13 of 68 slices shown]
[im 1/68]
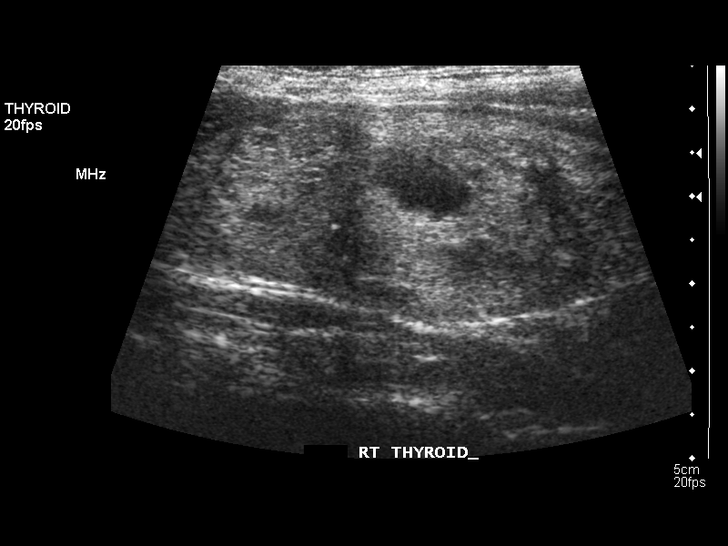
[im 6/68]
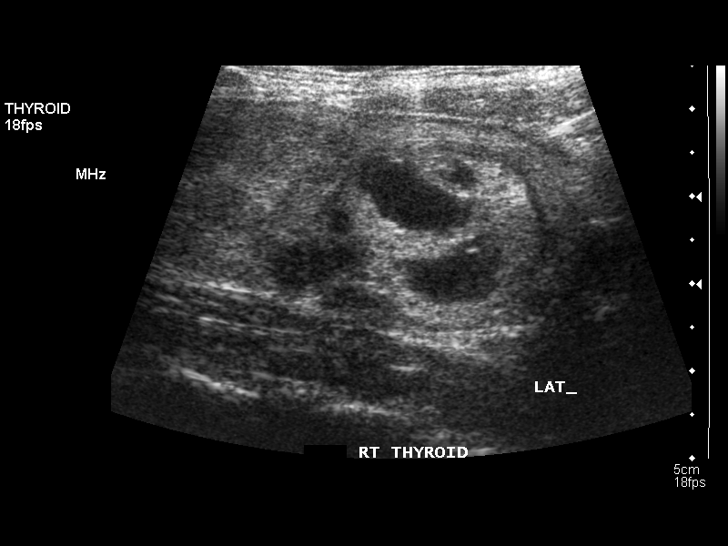
[im 12/68]
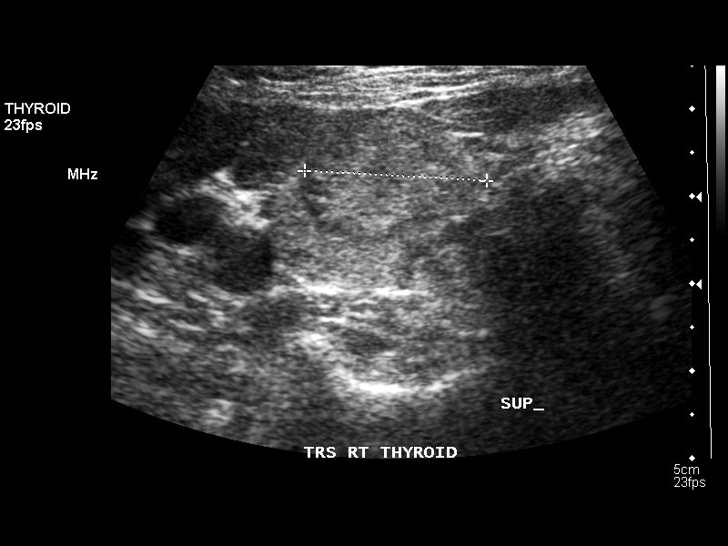
[im 17/68]
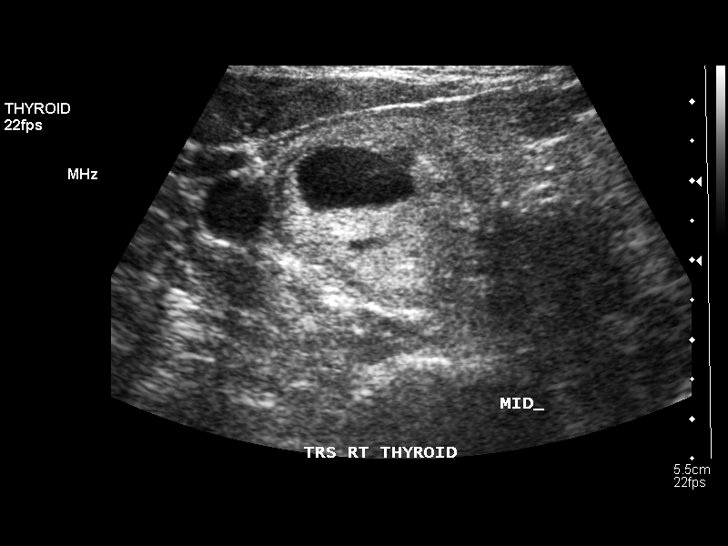
[im 23/68]
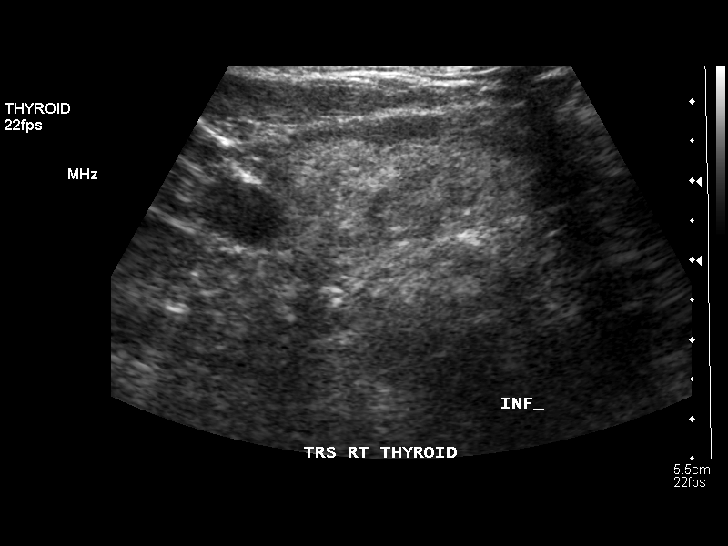
[im 28/68]
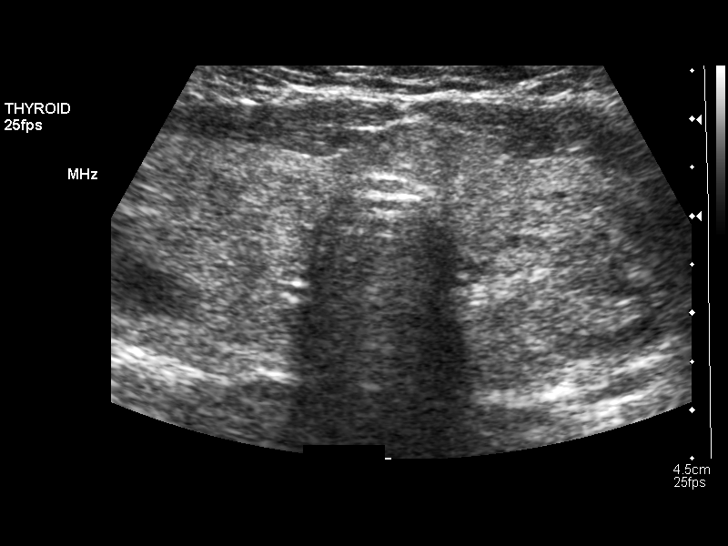
[im 34/68]
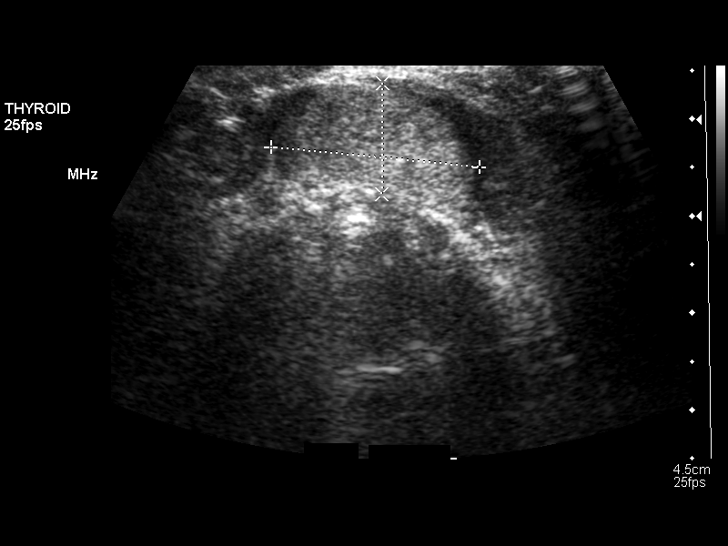
[im 40/68]
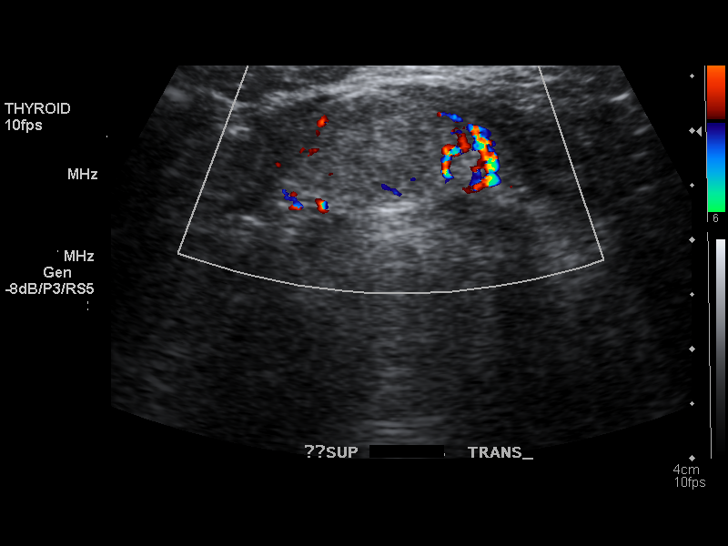
[im 45/68]
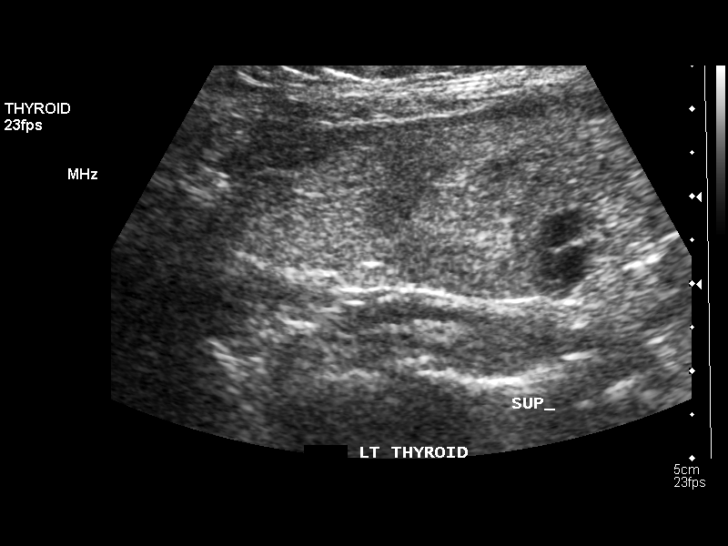
[im 51/68]
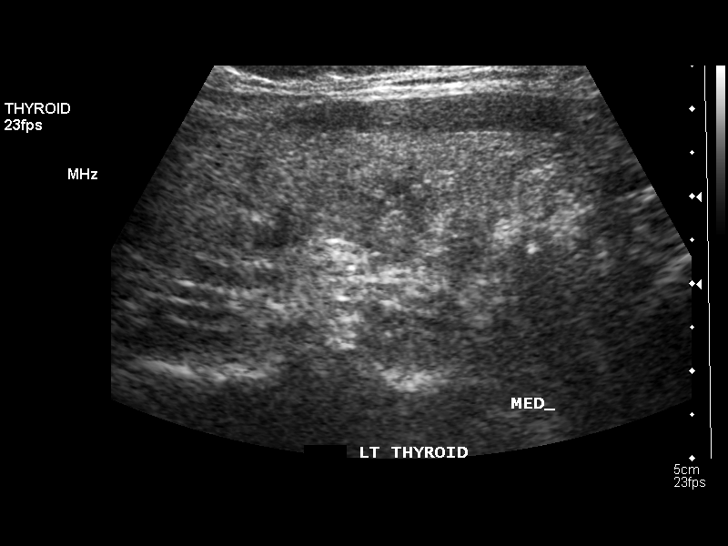
[im 56/68]
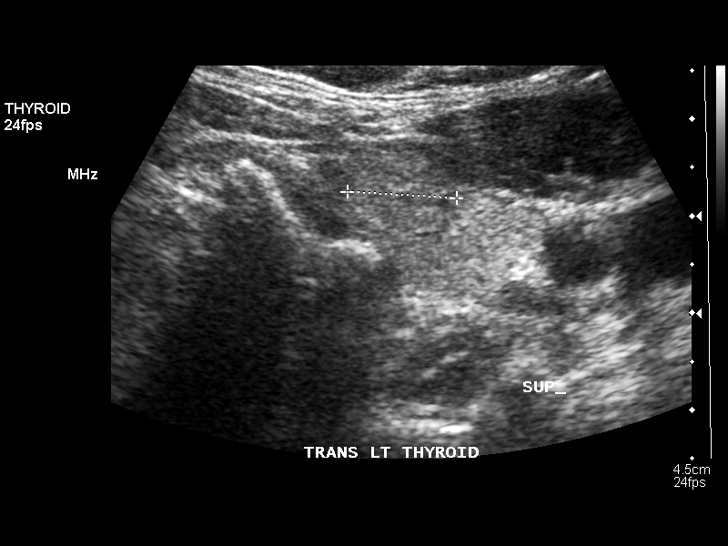
[im 62/68]
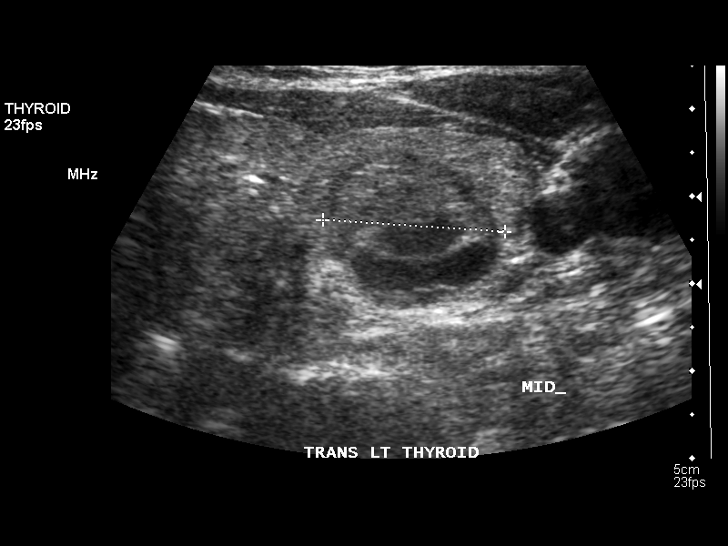
[im 68/68]
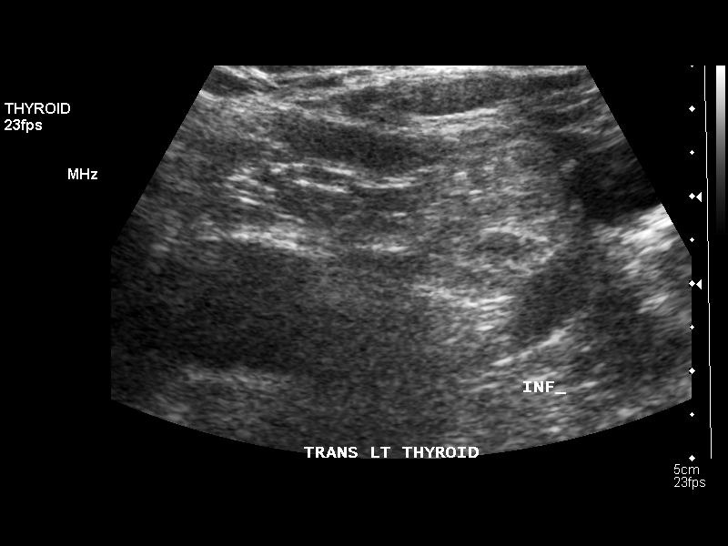

[13 of 25 positions shown; findings below may reference images not displayed]

FINDINGS: The thyroid gland is diffusely enlarged and nodular.  The
right lobe measures 5.2 cm sagittally, with a depth of 2.3 cm and
width of 3.3 cm.  The left lobe measures 5.8 x 2.2 x 2.8 cm, with
the isthmus measuring 7 mm in thickness.  The gland is diffusely
inhomogeneous.

The largest solid nodule is noted in the left mid and lower lobe
measuring 3.4 x 1.7 x 2.1 cm.  In review of the thyroid nuclear
medicine scan, this nodule may be "cold".  A solid nodule is noted
within the right upper pole measuring 2.4 x 1.4 x 2.1 cm, and this
may represent the hyperfunctioning nodule on thyroid nuclear
medicine scan.  A nodule in the mid right lobe measures 3.1 x 2.1 x
2.7 cm.  This could represent a "cold" nodule on thyroid nuclear
medicine scan.  A nodule emanating from the isthmus superiorly
measures 2.2 x 1.1 x 2.1 cm.
IMPRESSION: Multiple thyroid nodules bilaterally, with the largest nodule in
the mid lower left lobe which may be "cold" as well as a possible
"cold" nodule in the mid right lobe as described above.

## 2011-04-15 LAB — POCT CARDIAC MARKERS
CKMB, poc: <1 — ABNORMAL LOW
Myoglobin, poc: 65.6
Myoglobin, poc: 73.3
Operator id: 151321

## 2011-04-15 LAB — I-STAT 8, (EC8 V) (CONVERTED LAB)
Acid-Base Excess: 1
Chloride: 102
HCT: 43
Operator id: 151321
Potassium: 4.4
Sodium: 138
TCO2: 31
pH, Ven: 7.312 — ABNORMAL HIGH

## 2011-04-15 LAB — DIFFERENTIAL
Basophils Absolute: 0
Basophils Relative: 0
Lymphocytes Relative: 26
Neutro Abs: 5.6
Neutrophils Relative %: 62

## 2011-04-15 LAB — CBC
Hemoglobin: 12.6
MCHC: 33
Platelets: 372
RDW: 16.3 — ABNORMAL HIGH

## 2011-04-15 LAB — B-NATRIURETIC PEPTIDE (CONVERTED LAB): Pro B Natriuretic peptide (BNP): <30

## 2011-04-15 LAB — POCT I-STAT CREATININE: Operator id: 151321

## 2011-04-18 LAB — CBC
Hemoglobin: 10.2 — ABNORMAL LOW
Hemoglobin: 9.7 — ABNORMAL LOW
MCHC: 32
MCHC: 32.9
MCV: 83.3
Platelets: 507 — ABNORMAL HIGH
RBC: 3.63 — ABNORMAL LOW
RBC: 3.84 — ABNORMAL LOW
RDW: 17.4 — ABNORMAL HIGH
RDW: 17.8 — ABNORMAL HIGH
WBC: 12.2 — ABNORMAL HIGH

## 2011-04-18 LAB — DIFFERENTIAL
Basophils Absolute: 0.1
Basophils Relative: 1
Eosinophils Absolute: 0.4
Monocytes Absolute: 0.7
Monocytes Relative: 7
Neutrophils Relative %: 66

## 2011-04-18 LAB — I-STAT 8, (EC8 V) (CONVERTED LAB)
Acid-Base Excess: 2
BUN: 6
Chloride: 109
HCT: 33 — ABNORMAL LOW
Hemoglobin: 11.2 — ABNORMAL LOW
Operator id: 151321
Potassium: 4

## 2011-04-18 LAB — HEMOGLOBIN A1C
Hgb A1c MFr Bld: 9.5 — ABNORMAL HIGH
Mean Plasma Glucose: 261

## 2011-04-18 LAB — BASIC METABOLIC PANEL
BUN: 9
BUN: 9
CO2: 38 — ABNORMAL HIGH
Calcium: 9
Calcium: 9.1
Calcium: 9.1
Chloride: 102
Creatinine, Ser: 0.75
Creatinine, Ser: 0.83
Creatinine, Ser: 0.87
GFR calc Af Amer: >60
GFR calc Af Amer: >60
GFR calc Af Amer: >60
GFR calc non Af Amer: >60
GFR calc non Af Amer: >60
Glucose, Bld: 125 — ABNORMAL HIGH
Potassium: 4.2
Sodium: 140
Sodium: 140

## 2011-04-18 LAB — PHOSPHORUS
Phosphorus: 3
Phosphorus: 3.8

## 2011-04-18 LAB — MAGNESIUM
Magnesium: 1.9
Magnesium: 2

## 2011-04-18 LAB — TESTOSTERONE, FREE: Testosterone, Free: 5.9 pg/mL (ref 0.6–6.8)

## 2011-04-18 LAB — TESTOSTERONE, % FREE: Testosterone-% Free: 2.3 — ABNORMAL HIGH (ref 0.4–2.4)

## 2011-04-18 LAB — SEX HORMONE BINDING GLOBULIN: Sex Hormone Binding: 21 nmol/L (ref 18–114)

## 2011-04-18 LAB — TESTOSTERONE: Testosterone: 25.99 (ref 10–70)

## 2011-04-18 LAB — B-NATRIURETIC PEPTIDE (CONVERTED LAB): Pro B Natriuretic peptide (BNP): <30

## 2011-04-18 LAB — POCT I-STAT CREATININE: Creatinine, Ser: 0.8

## 2011-04-18 LAB — TSH: TSH: 0.172 — ABNORMAL LOW

## 2011-04-18 LAB — T4, FREE: Free T4: 1.63

## 2011-04-20 LAB — BASIC METABOLIC PANEL
BUN: 11
BUN: 15
CO2: 34 — ABNORMAL HIGH
Chloride: 101
Creatinine, Ser: 0.86
GFR calc non Af Amer: >60
Glucose, Bld: 122 — ABNORMAL HIGH
Glucose, Bld: 374 — ABNORMAL HIGH
Potassium: 3.9
Potassium: 4.4
Sodium: 144

## 2011-04-20 LAB — CBC
HCT: 32.1 — ABNORMAL LOW
Hemoglobin: 10.4 — ABNORMAL LOW
MCHC: 32.4
MCV: 84.5
Platelets: 375
RDW: 17.9 — ABNORMAL HIGH

## 2011-04-20 LAB — HEMOGLOBIN A1C: Hgb A1c MFr Bld: 8.1 — ABNORMAL HIGH

## 2011-04-25 LAB — COMPREHENSIVE METABOLIC PANEL
ALT: 29
AST: 33
Albumin: 3.5
CO2: 28
Calcium: 9.2
Chloride: 102
Creatinine, Ser: 0.76
GFR calc Af Amer: >60
Sodium: 138
Total Bilirubin: 0.7

## 2011-04-25 LAB — APTT: aPTT: 24

## 2011-04-25 LAB — DIFFERENTIAL
Eosinophils Absolute: 0
Eosinophils Relative: 0
Lymphocytes Relative: 10 — ABNORMAL LOW
Lymphs Abs: 0.9
Monocytes Absolute: 0 — ABNORMAL LOW

## 2011-04-25 LAB — BASIC METABOLIC PANEL
BUN: 5 — ABNORMAL LOW
CO2: 29
Calcium: 9.2
Calcium: 9.3
Creatinine, Ser: 0.59
Creatinine, Ser: 0.65
GFR calc Af Amer: >60
GFR calc non Af Amer: >60
Glucose, Bld: 174 — ABNORMAL HIGH
Glucose, Bld: 353 — ABNORMAL HIGH

## 2011-04-25 LAB — CBC
HCT: 34.9 — ABNORMAL LOW
MCHC: 31.8
MCV: 82.9
Platelets: 338
Platelets: 375
RBC: 4.53
RDW: 15.6 — ABNORMAL HIGH
RDW: 16.1 — ABNORMAL HIGH
WBC: 7.5
WBC: 9.6

## 2011-04-25 LAB — PROTIME-INR: INR: 1

## 2011-04-25 LAB — GLUCOSE, CAPILLARY
Glucose-Capillary: 234 — ABNORMAL HIGH
Glucose-Capillary: 287 — ABNORMAL HIGH
Glucose-Capillary: 305 — ABNORMAL HIGH
Glucose-Capillary: 332 — ABNORMAL HIGH
Glucose-Capillary: 393 — ABNORMAL HIGH
Glucose-Capillary: 405 — ABNORMAL HIGH
Glucose-Capillary: 478 — ABNORMAL HIGH

## 2011-04-25 LAB — GLUCOSE, RANDOM: Glucose, Bld: 357 — ABNORMAL HIGH

## 2011-04-25 LAB — D-DIMER, QUANTITATIVE: D-Dimer, Quant: 1.19 — ABNORMAL HIGH

## 2011-04-25 IMAGING — NM NM RAI THERAPY FOR HYPERTHYROIDISM
1 series · 1 of 1 positions shown · non-contrast
Comparison: none

CLINICAL DATA: Hyperfunctioning thyroid nodule.  Treatment with
radioactive iodine.

NUCLEAR MEDICINE RADIOACTIVE IODINE THERAPY FOR HYPERTHYROIDISM
TECHNIQUE: The risks and benefits of radioactive iodine therapy
were discussed with the patient in detail. Alternative therapies
were also mentioned. Radiation safety was discussed with the
patient, including how to protect the general public from exposure.
There were no barriers to communication.  Written consent was
obtained.  The patient then received a capsule containing the
radiopharmaceutical.  The patient will follow-up with the referring
physician.
Radiopharmaceutical: 31 mCi of G-4M4 sodium iodide capsule.

[static - general purpose · 1.60mm/px · 1 of 1 slices shown]
[im 1/1]
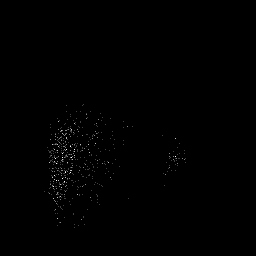

[1 of 1 positions shown; findings below may reference images not displayed]

IMPRESSION: Radioactive iodine treatment given for hyperfunctioning thyroid
nodule.

## 2011-06-06 ENCOUNTER — Telehealth: Payer: Self-pay | Admitting: *Deleted

## 2011-06-06 NOTE — Telephone Encounter (Signed)
Pt's PCP is on medical leave and wants to know if Dr Marin Olp will call her in something (i.e. antibiotic) for her hoarseness).

## 2011-06-07 ENCOUNTER — Other Ambulatory Visit: Payer: Self-pay | Admitting: *Deleted

## 2011-06-07 MED ORDER — AZITHROMYCIN 250 MG PO TABS: ORAL_TABLET | ORAL | Status: AC

## 2011-06-20 NOTE — Progress Notes (Signed)
CC:   [name], M.D. [name], M.D. [name], MD [name], M.D.  DIAGNOSES: 1. 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 2. Insulin-dependent diabetes. 3. Hypertension.  CURRENT THERAPY:  Observation.  INTERIM HISTORY:  [name] returns for followup.  She is doing quite well with respect to her stomach cancer.  It has now been 2 years since she underwent resection.  She had 2 positive lymph nodes.  She was treated with adjuvant chemo and radiation therapy.  We did do a repeat CT scan on her.  This done on 03/04/2011.  The CT scan did not show any evidence of recurrent/metastatic disease.  She has recovered from her hip surgery.  She had right hip surgery. This has made her life a whole lot better which I am certainly happy about.  She has not noted any cough.  She still worries about her diabetes. Unfortunately, her family doctor, [name], is out on medical leave. I think she is being referred to [name] to try to help manage her diabetes.  She has not had bleeding.  She has had no leg swelling.  She still has a little bit of an undefined macular-type rash which she has had for about a year so.  She dies gave asthma.  There have been no "flare-ups" despite the summer weather.  PHYSICAL EXAMINATION:  General:  Obese black female in no obvious distress.  Vital Signs:  Today, temperature 98.4, pulse 92, respiratory rate 18, blood pressure 117/85.  Weight is 284.  Head and Neck Exam: Normocephalic, atraumatic skull.  There are no ocular or oral lesions. There are no palpable cervical or supraclavicular lymph nodes.  Lungs: Clear bilaterally.  She has no rales, wheezes, or rhonchi.  Cardiac Exam:  Regular rate and rhythm with normal S1 and S2.  There are no murmurs, rubs, or bruits.  Abdominal Exam:  Obese but soft.  She has a well-healed laparotomy wound.  She has no guarding or rebound tenderness.  There is no fluid wave.  There  is no palpable hepatosplenomegaly.  Back Exam:  No tenderness over the spine, ribs, or hips.  Extremities:  No clubbing, cyanosis, or edema.  She has good range of her joints.  She has decent pulses in her feet.  Skin:  Some patchy areas of a macular-type rash on her upper extremities. Neurological Exam:  No focal neurological deficits.  LABORATORY DATA:  Her sodium is 141, potassium 4.2, BUN 9, creatinine 0.7.  Her glucose is 214.  Ferritin is 34.  TSH is 2.6.  Hemoglobin A1c is 10.3.  White cell count 6.4, hemoglobin 12.5, hematocrit 36.5, platelet count 318.  IMPRESSION:  [name] is a 60 year old African female with stage IB adenocarcinoma of stomach.  She is now 2 years out from resection.  I have to believe that her prognosis should be descent.  Her main problems clearly are nonhematologic/oncologic.  She really needs to get her diabetes under better control.  We will go ahead and plan to get her back and see [place] in about 4 months or so.  As far as doing more scans, I think this is debatable as she is 2 years out.    ______________________________ Volanda Napoleon, M.D. PRE/MEDQ  D:  03/12/2011  T:  03/12/2011  Job:  [id number]

## 2011-06-26 ENCOUNTER — Emergency Department (HOSPITAL_COMMUNITY)
Admission: EM | Admit: 2011-06-26 | Discharge: 2011-06-26 | Disposition: A | Discharge status: Home / Self Care | Payer: Medicare Other

## 2011-06-27 ENCOUNTER — Other Ambulatory Visit: Payer: Self-pay | Admitting: *Deleted

## 2011-06-27 DIAGNOSIS — I10 Essential (primary) hypertension: Secondary | ICD-10-CM

## 2011-06-27 MED ORDER — TRIAMTERENE-HCTZ 37.5-25 MG PO CAPS: 1.0000 | ORAL_CAPSULE | Freq: Every day | ORAL | Status: DC

## 2011-07-04 ENCOUNTER — Other Ambulatory Visit (HOSPITAL_COMMUNITY): Payer: Self-pay | Admitting: Cardiovascular Disease

## 2011-07-07 ENCOUNTER — Ambulatory Visit (HOSPITAL_COMMUNITY): Payer: Medicare Other

## 2011-07-08 ENCOUNTER — Ambulatory Visit (HOSPITAL_COMMUNITY): Payer: Medicare Other

## 2011-07-08 ENCOUNTER — Other Ambulatory Visit: Payer: Self-pay | Admitting: *Deleted

## 2011-07-08 ENCOUNTER — Ambulatory Visit (HOSPITAL_BASED_OUTPATIENT_CLINIC_OR_DEPARTMENT_OTHER): Payer: Medicare Other

## 2011-07-08 VITALS — BP 114/80 | HR 83 | Temp 97.1°F

## 2011-07-08 DIAGNOSIS — C169 Malignant neoplasm of stomach, unspecified: Secondary | ICD-10-CM

## 2011-07-08 DIAGNOSIS — Z452 Encounter for adjustment and management of vascular access device: Secondary | ICD-10-CM

## 2011-07-08 DIAGNOSIS — C165 Malignant neoplasm of lesser curvature of stomach, unspecified: Secondary | ICD-10-CM

## 2011-07-08 MED ORDER — LIDOCAINE-PRILOCAINE 2.5-2.5 % EX CREA: TOPICAL_CREAM | CUTANEOUS | Status: AC | PRN

## 2011-07-08 MED ORDER — SODIUM CHLORIDE 0.9 % IJ SOLN
10.0000 mL | INTRAMUSCULAR | Status: DC | PRN
  Administered 2011-07-08: 10 mL via INTRAVENOUS
  Filled 2011-07-08: qty 10

## 2011-07-08 MED ORDER — HEPARIN SOD (PORK) LOCK FLUSH 100 UNIT/ML IV SOLN
500.0000 [IU] | Freq: Once | INTRAVENOUS | Status: AC
  Administered 2011-07-08: 500 [IU] via INTRAVENOUS
  Filled 2011-07-08: qty 5

## 2011-08-10 ENCOUNTER — Encounter: Payer: Self-pay | Admitting: *Deleted

## 2011-08-10 NOTE — Progress Notes (Signed)
Pt called to see if she needed to have another CT Scan. Reviewed Dr Antonieta Pert last note from 03/12/11. Explained to her that he said that he was unsure about additional scans but they could discuss this further during her next appt (08-12-11). She stated that she wasn't going to keep that appt. Told her to make sure to call Liliane Channel in the morning to r/s so they could discuss this at a later time.

## 2011-08-11 ENCOUNTER — Telehealth: Payer: Self-pay | Admitting: Hematology & Oncology

## 2011-08-11 NOTE — Telephone Encounter (Signed)
Pt moved 1-18 to 2-14

## 2011-08-12 ENCOUNTER — Other Ambulatory Visit: Payer: Medicare Other | Admitting: Lab

## 2011-08-12 ENCOUNTER — Ambulatory Visit: Payer: Medicare Other | Admitting: Hematology & Oncology

## 2011-08-22 ENCOUNTER — Other Ambulatory Visit: Payer: Self-pay | Admitting: *Deleted

## 2011-08-22 DIAGNOSIS — I10 Essential (primary) hypertension: Secondary | ICD-10-CM

## 2011-08-22 MED ORDER — TRIAMTERENE-HCTZ 37.5-25 MG PO CAPS: 1.0000 | ORAL_CAPSULE | Freq: Every day | ORAL | Status: DC

## 2011-08-22 NOTE — Telephone Encounter (Signed)
Received refill authorization for trimterene/hctz 37.5/25 mg cap. Refilled via epresribe as this is a chronic med and Dr Marin Olp confirmed he prescribes it.

## 2011-09-07 ENCOUNTER — Ambulatory Visit (HOSPITAL_BASED_OUTPATIENT_CLINIC_OR_DEPARTMENT_OTHER): Payer: Medicare Other | Admitting: Hematology & Oncology

## 2011-09-07 ENCOUNTER — Other Ambulatory Visit (HOSPITAL_BASED_OUTPATIENT_CLINIC_OR_DEPARTMENT_OTHER): Payer: Medicare Other | Admitting: Lab

## 2011-09-07 VITALS — HR 86 | Temp 98.2°F | Ht 67.0 in | Wt 298.0 lb

## 2011-09-07 DIAGNOSIS — I10 Essential (primary) hypertension: Secondary | ICD-10-CM

## 2011-09-07 DIAGNOSIS — F32A Depression, unspecified: Secondary | ICD-10-CM

## 2011-09-07 DIAGNOSIS — E119 Type 2 diabetes mellitus without complications: Secondary | ICD-10-CM

## 2011-09-07 DIAGNOSIS — C169 Malignant neoplasm of stomach, unspecified: Secondary | ICD-10-CM

## 2011-09-07 DIAGNOSIS — C165 Malignant neoplasm of lesser curvature of stomach, unspecified: Secondary | ICD-10-CM

## 2011-09-07 DIAGNOSIS — Z1239 Encounter for other screening for malignant neoplasm of breast: Secondary | ICD-10-CM

## 2011-09-07 DIAGNOSIS — F329 Major depressive disorder, single episode, unspecified: Secondary | ICD-10-CM

## 2011-09-07 LAB — CBC WITH DIFFERENTIAL (CANCER CENTER ONLY)
BASO#: 0 10*3/uL (ref 0.0–0.2)
BASO%: 0.3 % (ref 0.0–2.0)
EOS%: 4.6 % (ref 0.0–7.0)
HCT: 35 % (ref 34.8–46.6)
HGB: 11.5 g/dL — ABNORMAL LOW (ref 11.6–15.9)
LYMPH%: 27.8 % (ref 14.0–48.0)
MCH: 26.8 pg (ref 26.0–34.0)
MCHC: 32.9 g/dL (ref 32.0–36.0)
MONO%: 10.1 % (ref 0.0–13.0)
NEUT%: 57.2 % (ref 39.6–80.0)
RDW: 15.5 % (ref 11.1–15.7)

## 2011-09-07 LAB — COMPREHENSIVE METABOLIC PANEL
ALT: 13 U/L (ref 0–35)
BUN: 17 mg/dL (ref 6–23)
CO2: 25 mEq/L (ref 19–32)
Calcium: 9.5 mg/dL (ref 8.4–10.5)
Chloride: 106 mEq/L (ref 96–112)
Creatinine, Ser: 0.96 mg/dL (ref 0.50–1.10)
Glucose, Bld: 106 mg/dL — ABNORMAL HIGH (ref 70–99)
Total Bilirubin: 0.4 mg/dL (ref 0.3–1.2)

## 2011-09-07 LAB — LACTATE DEHYDROGENASE: LDH: 173 U/L (ref 94–250)

## 2011-09-07 MED ORDER — FLUOXETINE HCL 20 MG PO CAPS: 20.0000 mg | ORAL_CAPSULE | Freq: Every day | ORAL | Status: DC

## 2011-09-07 MED ORDER — SODIUM CHLORIDE 0.9 % IJ SOLN
10.0000 mL | INTRAMUSCULAR | Status: DC | PRN
  Administered 2011-09-07: 10 mL via INTRAVENOUS
  Filled 2011-09-07: qty 10

## 2011-09-07 MED ORDER — HEPARIN SOD (PORK) LOCK FLUSH 100 UNIT/ML IV SOLN
500.0000 [IU] | Freq: Once | INTRAVENOUS | Status: AC
  Administered 2011-09-07: 500 [IU] via INTRAVENOUS
  Filled 2011-09-07: qty 5

## 2011-09-07 NOTE — Progress Notes (Signed)
Addended by: Burney Gauze R on: 09/07/2011 10:04 AM   Modules accepted: Orders

## 2011-09-07 NOTE — Progress Notes (Signed)
CC:   Ardyth Gal. Spruill, M.D. [name], M.D. [name], M.D.  DIAGNOSES: 1. 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 2. Hypertension. 3. Hypothyroidism. 4. Insulin-dependent diabetes.  CURRENT THERAPY:  Observation.  INTERIM HISTORY:  [name] comes in for followup.  We last saw her back in August.  At that point in time, CT scan was negative for any kind of recurrent disease.  It has now been 2-1/2 years since she underwent resection.  She did receive adjuvant chemo and radiation therapy.  Her main issue continues to be her diabetes.  She has been seen by [name] to try to help with the diabetes.  She is on Levemir to try to help with her blood sugar control.  She is also getting a little bit more depressed.  She says she just very sad.  She does not have a lot of energy to do activities.  The patient wishes that she just felt better.  We will go ahead and put her on something to try to help with her "frame of mind."  She is also trying to lose weight.  She is worried about her weight gain.  She is trying to exercise a little more.  PHYSICAL EXAMINATION:  General Appearance:  This is an obese, black female in no obvious distress.  Vital Signs:  Temperature 98.2.  Pulse 86.  Respiratory rate 16.  Blood pressure 114/80.  Weight is 298.  Head and Neck Exam:  A normocephalic, atraumatic skull.  There are no ocular or oral lesions.  There are no palpable cervical or supraclavicular lymph nodes.  Lungs:  Clear bilaterally.  Cardiac Exam:  Regular rate and rhythm with a normal S1 and S2.  There are no murmurs, rubs, or bruits.  Abdominal Exam:  Soft with good bowel sounds.  There is no palpable abdominal mass.  She has well-healed laparotomy wounds.  There is no guarding or rebound tenderness.  There is no palpable hepatosplenomegaly.  Back Exam:  No tenderness over the spine, ribs, or hips.  Extremities:  No clubbing, cyanosis, or edema.   Neurological Exam:  No focal neurological deficits.  LABORATORY STUDIES:  All pending.  IMPRESSION:  Ms. Bukowski is a 61 year old African American female with a history of stage IB adenocarcinoma of the stomach.  I think she had II positive lymph nodes.  She underwent postoperative chemoradiation therapy.  She has done very well with this.  She completed all of this back in, I think, November 2010.  Again, she is asymptomatic from my point of view.  I do not see that we need to put her through any additional scans.  I am going to go ahead put her on some Prozac at 20 mg daily.  We will see if this does not help with her depression.  We will get her back in 4 months for followup.    ______________________________ Volanda Napoleon, M.D. PRE/MEDQ  D:  09/07/2011  T:  09/07/2011  Job:  [id number]

## 2011-09-07 NOTE — Progress Notes (Signed)
This office note has been dictated.

## 2011-09-08 ENCOUNTER — Ambulatory Visit: Payer: Medicare Other | Admitting: Hematology & Oncology

## 2011-09-08 ENCOUNTER — Telehealth: Payer: Self-pay | Admitting: Hematology & Oncology

## 2011-09-08 ENCOUNTER — Other Ambulatory Visit: Payer: Medicare Other | Admitting: Lab

## 2011-09-08 NOTE — Telephone Encounter (Signed)
Pt aware of 2-21 mammogram at [facility]

## 2011-09-13 ENCOUNTER — Encounter: Payer: Self-pay | Admitting: *Deleted

## 2011-09-13 NOTE — Progress Notes (Signed)
Per Dr. Marin Olp, pt called and told her labs were all ok except her Hgb A1C was too high.  Pt states she has started on a new medication for this.

## 2011-09-15 ENCOUNTER — Ambulatory Visit (HOSPITAL_BASED_OUTPATIENT_CLINIC_OR_DEPARTMENT_OTHER)
Admission: RE | Admit: 2011-09-15 | Discharge: 2011-09-15 | Disposition: A | Discharge status: Home / Self Care | Payer: Medicare Other | Source: Ambulatory Visit | Attending: Hematology & Oncology | Admitting: Hematology & Oncology

## 2011-09-15 DIAGNOSIS — Z1231 Encounter for screening mammogram for malignant neoplasm of breast: Secondary | ICD-10-CM

## 2011-09-15 DIAGNOSIS — Z1239 Encounter for other screening for malignant neoplasm of breast: Secondary | ICD-10-CM

## 2011-09-22 IMAGING — CT CT CHEST W/ CM
2 of 5 series · 16 of 46 positions shown, 18 images · IV contrast (agent unspecified)
Comparison: CT examinations 02/09/2010.

CT CHEST

CLINICAL DATA: History of gastric cancer.

CT CHEST, ABDOMEN AND PELVIS WITH CONTRAST
TECHNIQUE: Multidetector CT imaging of the chest, abdomen and
pelvis was performed following the standard protocol during bolus
administration of intravenous contrast.
Contrast: 125 ml [id number].

[Series 2: cap with st · axial · 0.89mm/px · z∈[-531,-1]mm · 13 of 122 slices shown, 15 images]
[im 8/122  soft-tissue]
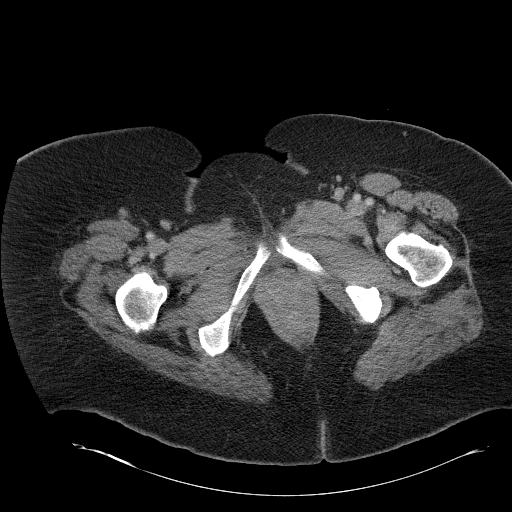
[im 8/122  bone]
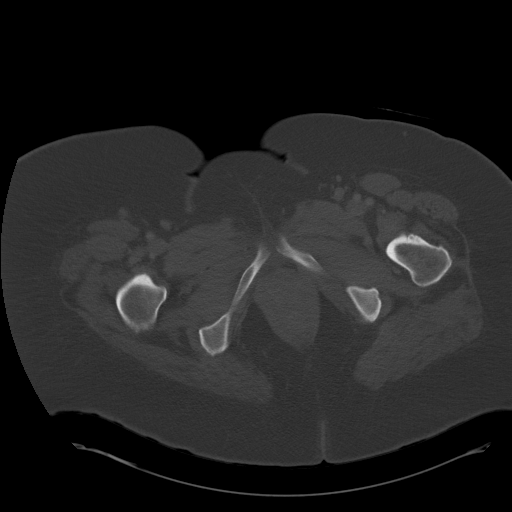
[im 15/122  soft-tissue]
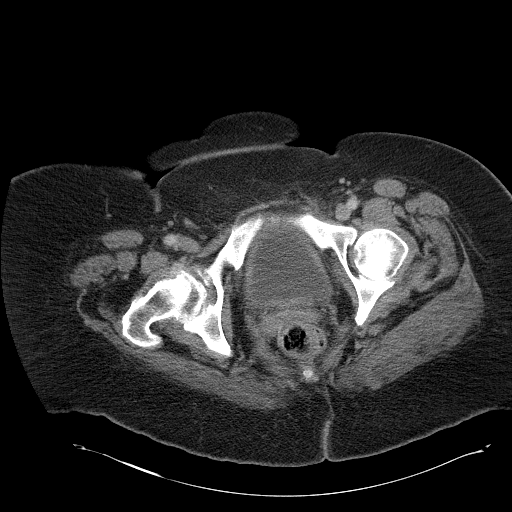
[im 29/122  soft-tissue]
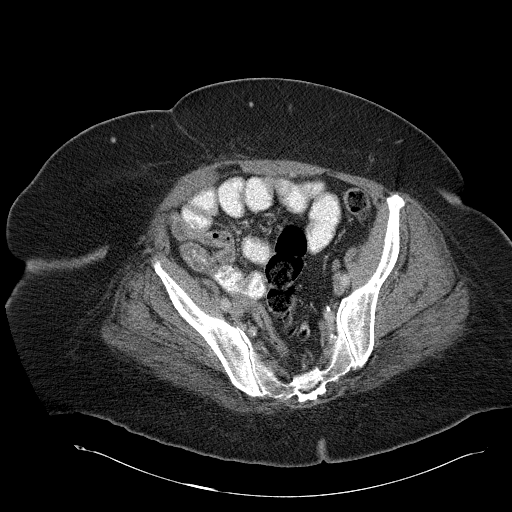
[im 36/122  soft-tissue]
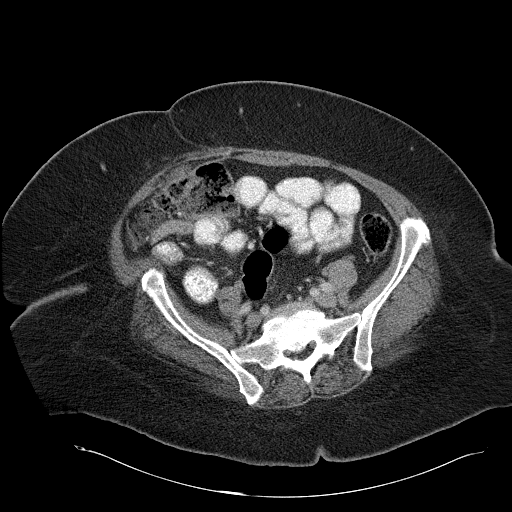
[im 43/122  soft-tissue]
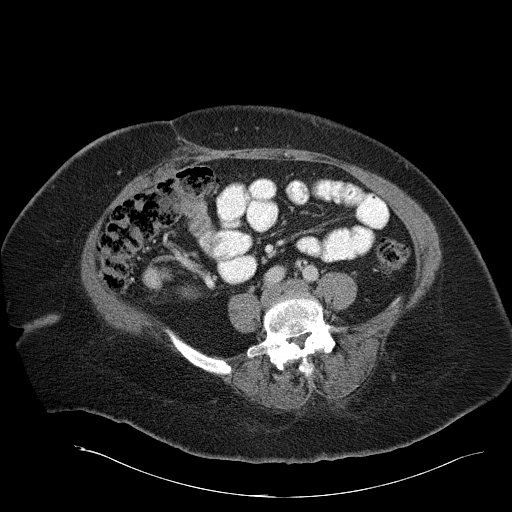
[im 50/122  soft-tissue]
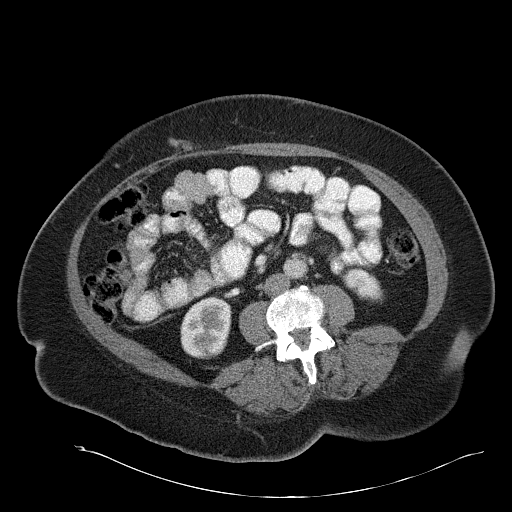
[im 65/122  soft-tissue]
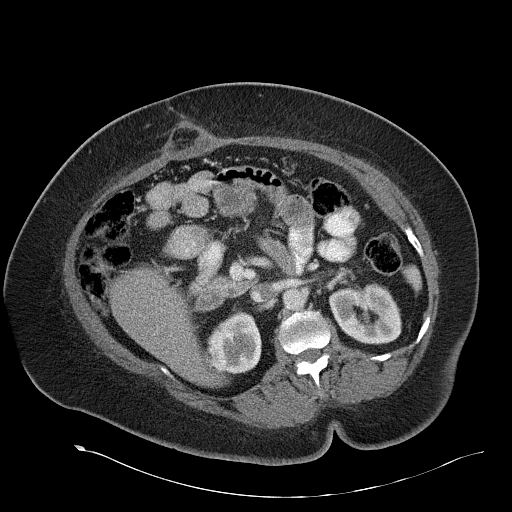
[im 72/122  soft-tissue]
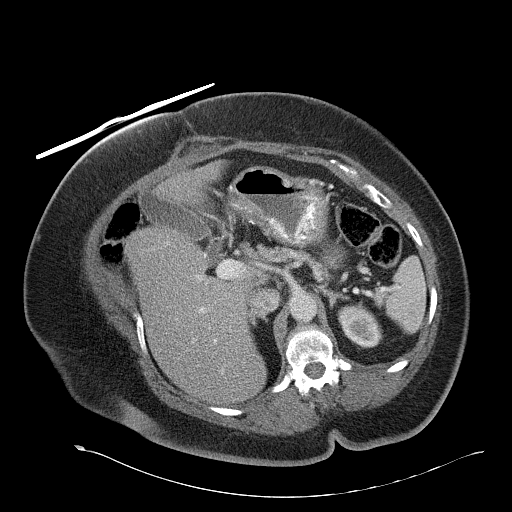
[im 79/122  soft-tissue]
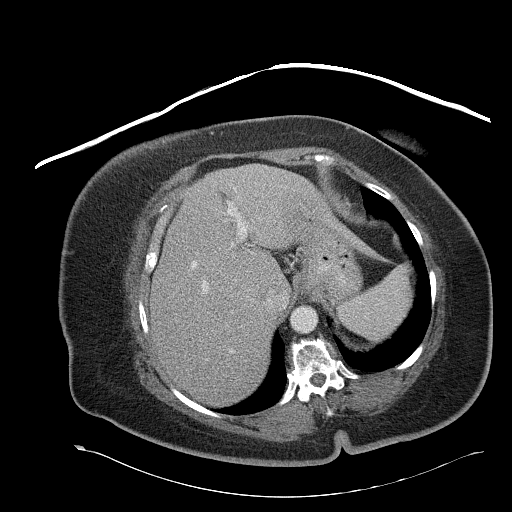
[im 79/122  bone]
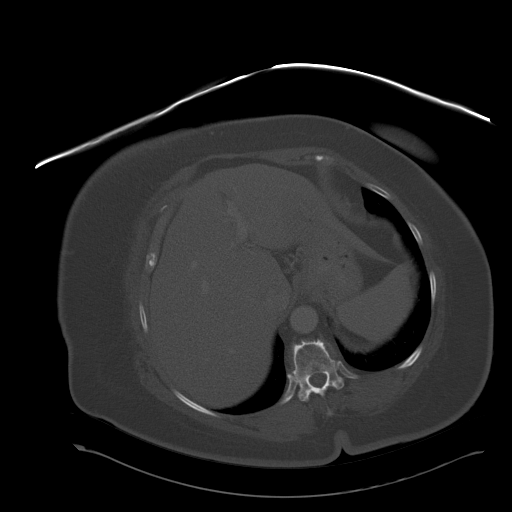
[im 86/122  soft-tissue]
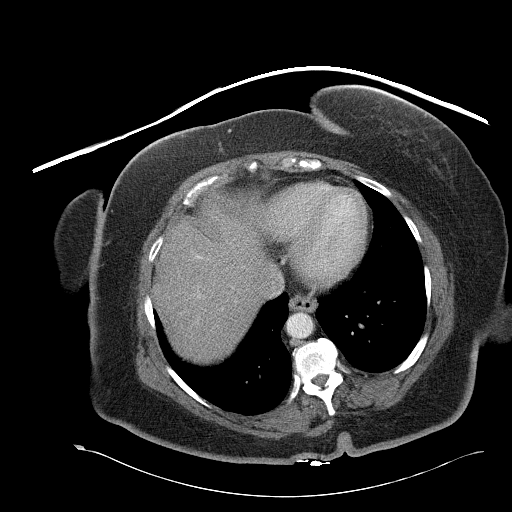
[im 93/122  soft-tissue]
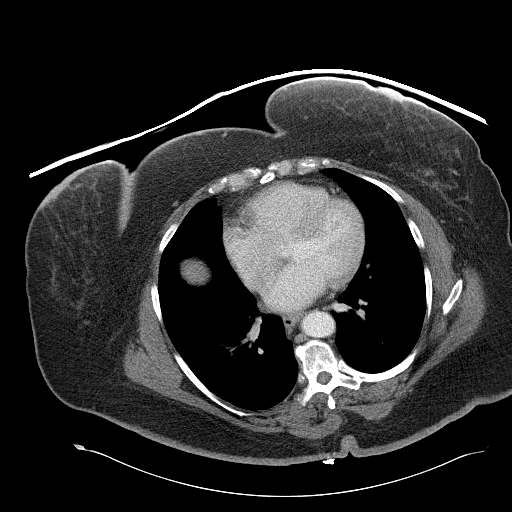
[im 107/122  soft-tissue]
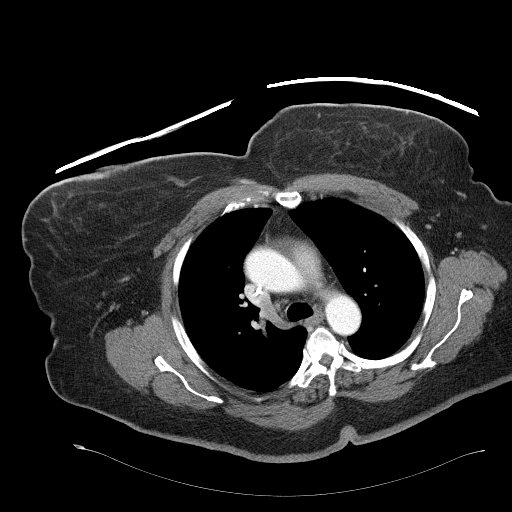
[im 114/122  soft-tissue]
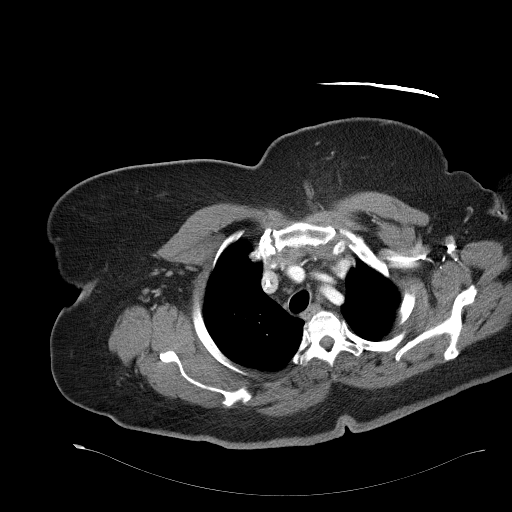

[Series 602: <mpr thick range> · coronal · 1.19mm/px · 3 of 116 slices shown]
[im 39/116  soft-tissue]
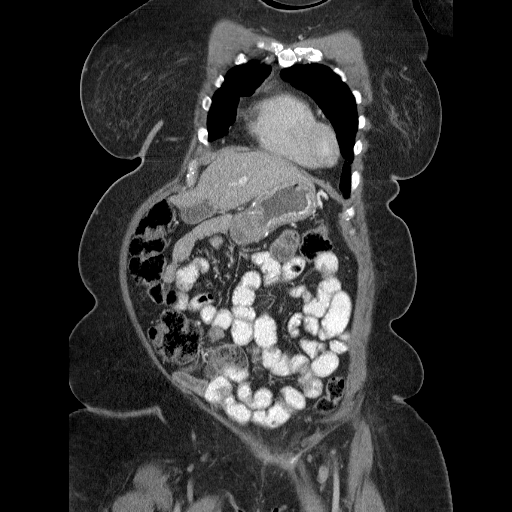
[im 52/116  soft-tissue]
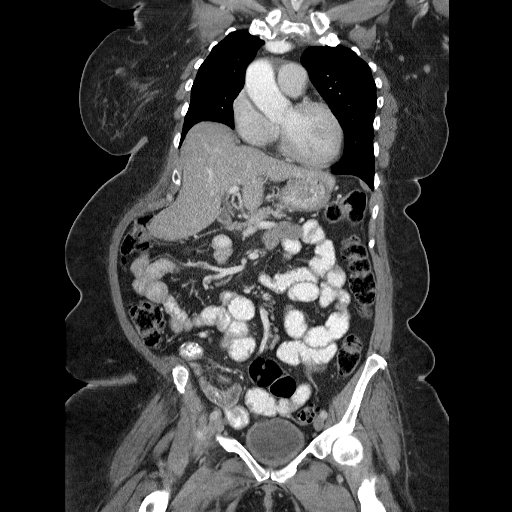
[im 64/116  soft-tissue]
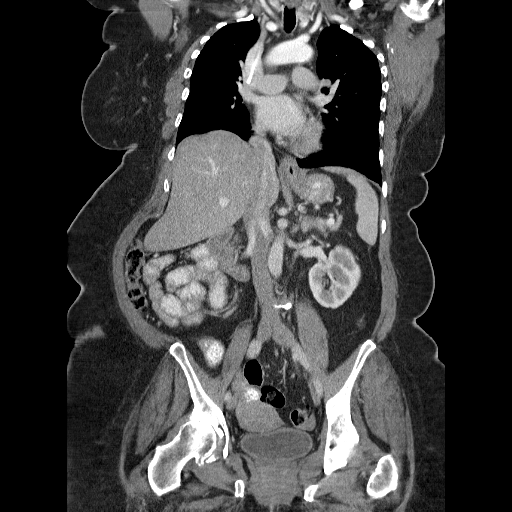

[16 of 46 positions shown; findings below may reference images not displayed]

FINDINGS: The chest wall is unremarkable and stable.  A right-
sided Port-A-Cath is noted.  No obvious breast masses.  No
supraclavicular or axillary adenopathy.  Stable multinodular
thyroid goiter.

The bony thorax is intact.

The heart is normal in size.  No pericardial effusion.  No
mediastinal or hilar adenopathy.  The aorta is normal in caliber.
No dissection.  The esophagus is grossly normal.  Mild  distal
esophageal wall thickening without obvious hiatal hernia.
Evaluation with a barium esophagram or endoscopy may be helpful for
further evaluation.

Examination of the lung parenchyma demonstrates no acute pulmonary
findings.  No pulmonary nodules or mass lesions.  No pleural
effusion.
IMPRESSION: 1.  Unremarkable and stable CT appearance of the chest.  No
findings for metastatic disease.
2.  Stable multinodular thyroid goiter.

CT ABDOMEN AND PELVIS
FINDINGS: There is diffuse fatty infiltration of the liver via no
focal hepatic lesions or biliary dilatation.  The gallbladder is
normal.  The spleen is normal in size.  No focal lesions.  The
pancreas is unremarkable.  The adrenal glands and kidneys
demonstrate no significant findings.

Stable surgical changes involving the stomach.  No findings for
recurrent tumor.  The gastroc jejunal anastomoses is stable.  As
noted on the chest part of the examination the distal esophagus
appears thickened.  This could be due to esophagitis or radiation.
Upper endoscopy or barium esophagram may be helpful for further
evaluation.

The small bowel and colon are unremarkable.  No mesenteric or
retroperitoneal masses or adenopathy.  The aorta is normal in
caliber.

Stable surgical changes involving the anterior abdominal wall.  No
significant pelvic findings.  The uterus and ovaries appear normal
no pelvic mass or adenopathy.

The bony structures are stable.  There are advanced degenerative
changes involving the right hip.
IMPRESSION: 1.  Distal esophageal wall thickening.  Recommend barium esophagram
or upper endoscopy for further evaluation.
2.  Stable appearance of the gastrojejunal anastomoses.  No
findings for recurrent tumor or metastatic disease in the abdomen.
3.  Diffuse fatty infiltration of the liver.

## 2011-09-27 ENCOUNTER — Other Ambulatory Visit: Payer: Self-pay | Admitting: *Deleted

## 2011-09-27 DIAGNOSIS — IMO0001 Reserved for inherently not codable concepts without codable children: Secondary | ICD-10-CM

## 2011-09-27 DIAGNOSIS — I10 Essential (primary) hypertension: Secondary | ICD-10-CM

## 2011-09-27 MED ORDER — ESOMEPRAZOLE MAGNESIUM 40 MG PO CPDR: 40.0000 mg | DELAYED_RELEASE_CAPSULE | Freq: Two times a day (BID) | ORAL | Status: DC

## 2011-09-27 MED ORDER — TRIAMTERENE-HCTZ 37.5-25 MG PO CAPS: 1.0000 | ORAL_CAPSULE | Freq: Every day | ORAL | Status: DC

## 2011-10-13 ENCOUNTER — Ambulatory Visit (HOSPITAL_BASED_OUTPATIENT_CLINIC_OR_DEPARTMENT_OTHER): Payer: Medicare Other

## 2011-10-18 ENCOUNTER — Telehealth: Payer: Self-pay | Admitting: Hematology & Oncology

## 2011-10-18 NOTE — Telephone Encounter (Signed)
Pt moved 3-27 to 4-12 for flush. She is aware of time between flush's

## 2011-11-04 ENCOUNTER — Other Ambulatory Visit: Payer: Self-pay | Admitting: Hematology & Oncology

## 2011-11-04 ENCOUNTER — Ambulatory Visit (HOSPITAL_BASED_OUTPATIENT_CLINIC_OR_DEPARTMENT_OTHER): Payer: Medicare Other | Admitting: Hematology & Oncology

## 2011-11-04 ENCOUNTER — Ambulatory Visit (HOSPITAL_BASED_OUTPATIENT_CLINIC_OR_DEPARTMENT_OTHER): Payer: Medicare Other

## 2011-11-04 ENCOUNTER — Ambulatory Visit (HOSPITAL_BASED_OUTPATIENT_CLINIC_OR_DEPARTMENT_OTHER)
Admission: RE | Admit: 2011-11-04 | Discharge: 2011-11-04 | Disposition: A | Discharge status: Home / Self Care | Payer: Medicare Other | Source: Ambulatory Visit | Attending: Hematology & Oncology | Admitting: Hematology & Oncology

## 2011-11-04 DIAGNOSIS — R05 Cough: Secondary | ICD-10-CM

## 2011-11-04 DIAGNOSIS — R059 Cough, unspecified: Secondary | ICD-10-CM | POA: Insufficient documentation

## 2011-11-04 DIAGNOSIS — J40 Bronchitis, not specified as acute or chronic: Secondary | ICD-10-CM

## 2011-11-04 DIAGNOSIS — R079 Chest pain, unspecified: Secondary | ICD-10-CM

## 2011-11-04 DIAGNOSIS — C169 Malignant neoplasm of stomach, unspecified: Secondary | ICD-10-CM

## 2011-11-04 MED ORDER — SODIUM CHLORIDE 0.9 % IV SOLN
INTRAVENOUS | Status: AC
  Administered 2011-11-04: 09:00:00 via INTRAVENOUS

## 2011-11-04 MED ORDER — BENZONATATE 200 MG PO CAPS: 200.0000 mg | ORAL_CAPSULE | Freq: Three times a day (TID) | ORAL | Status: AC | PRN

## 2011-11-04 MED ORDER — CEFDINIR 300 MG PO CAPS: 300.0000 mg | ORAL_CAPSULE | Freq: Two times a day (BID) | ORAL | Status: AC

## 2011-11-04 MED ORDER — ALBUTEROL SULFATE (5 MG/ML) 0.5% IN NEBU
2.5000 mg | INHALATION_SOLUTION | RESPIRATORY_TRACT | Status: DC
  Administered 2011-11-04: 2.5 mg via RESPIRATORY_TRACT
  Filled 2011-11-04: qty 0.5

## 2011-11-04 MED ORDER — IPRATROPIUM BROMIDE 0.02 % IN SOLN
0.5000 mg | RESPIRATORY_TRACT | Status: DC
  Administered 2011-11-04: 0.5 mg via RESPIRATORY_TRACT
  Filled 2011-11-04: qty 2.5

## 2011-11-04 MED ORDER — DEXTROSE 5 % IV SOLN
2.0000 g | INTRAVENOUS | Status: DC
  Administered 2011-11-04: 2 g via INTRAVENOUS
  Filled 2011-11-04: qty 2

## 2011-11-04 NOTE — Progress Notes (Signed)
This office note has been dictated.

## 2011-11-04 NOTE — Patient Instructions (Signed)

## 2011-11-05 NOTE — Progress Notes (Signed)
DIAGNOSES: 1. Stage IB (T1 N1 M0) adenocarcinoma  of the stomach. 2. Insulin-dependent diabetes. 3. Hypertension.  CURRENT THERAPY:  Observation.  INTERVAL HISTORY:  [name] comes in for an unscheduled visit.  I see her daughter every month.  She came in with her daughter.  [name] has been coughing up greenish mucus.  She really does not see her family doctor all that much.  As such, she came in hoping that I would help take care of her.  She has had no fever.  She said this cough started a day or so ago.  She is having some pain when she coughs.  We did go ahead and do a chest x-ray on her.  The chest x-ray showed no infiltrates.  There were no infusions.  No evidence of metastatic disease.  PHYSICAL EXAMINATION:  This is an obese black female in no obvious distress.  Her vital signs unfortunately were not taken.  Lungs:  Clear bilaterally.  May be some slight wheezing at the bases.  I heard no crackles.  Cardiac: Regular rate and rhythm with a normal S1 and S2. There are no murmurs, rubs or bruits.  Abdomen: Soft, obese.  She has good bowel sounds.  There is no fluid wave.  There is no palpable hepatosplenomegaly.  Extremities:  Some  stasis dermatitis changes in the lower legs.  IMPRESSION:  [name] is a 61 year old African female with a past history of stage I stomach cancer.  This really is not an issue for [place] at all.  She may have bronchitis. She is coughing up some purulent mucus.  Will go ahead and give her a dose of Rocephin in the office.  I will give her 2 grams.  She has a "penicillin allergy"  but this is only a rash.  We will give her some Omnicef to take at home.  Also will give her some Tessalon Perles to try to help with her cough.  Will keep her appointment as scheduled.  As always, I encouraged her to see her family doctor for these issues.  He, unfortunately, has been having health issues himself so it is hard for [name] to get to  see him.   ______________________________ Volanda Napoleon, M.D. PRE/MEDQ  D:  11/04/2011  T:  11/05/2011  Job:  [id number]

## 2011-12-04 IMAGING — CR DG CHEST 2V
2 series · 2 of 2 positions shown · non-contrast
Comparison: 08/17/2010

CLINICAL DATA: Fever and cough

CHEST - 2 VIEW

[w chest pa]
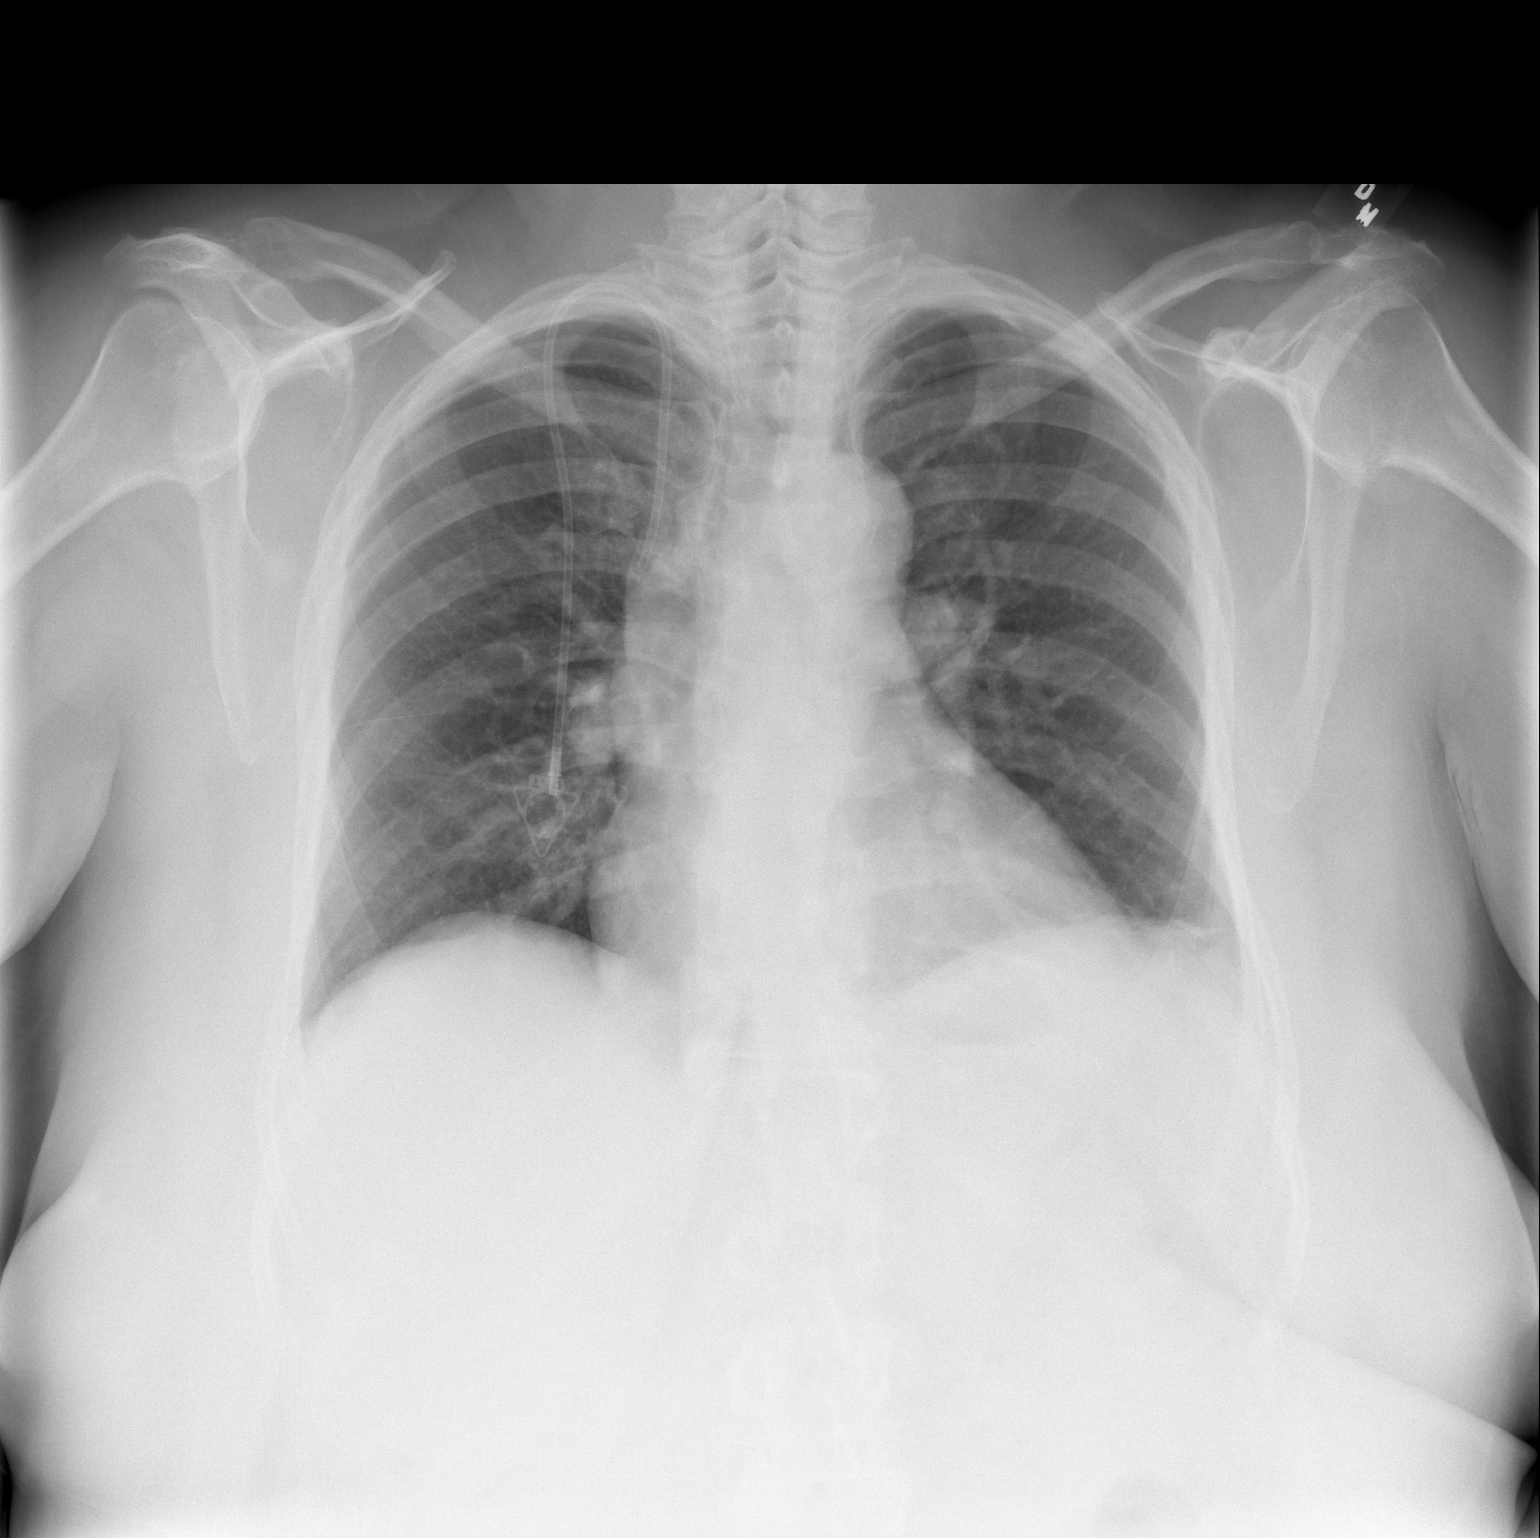

[w chest lat]
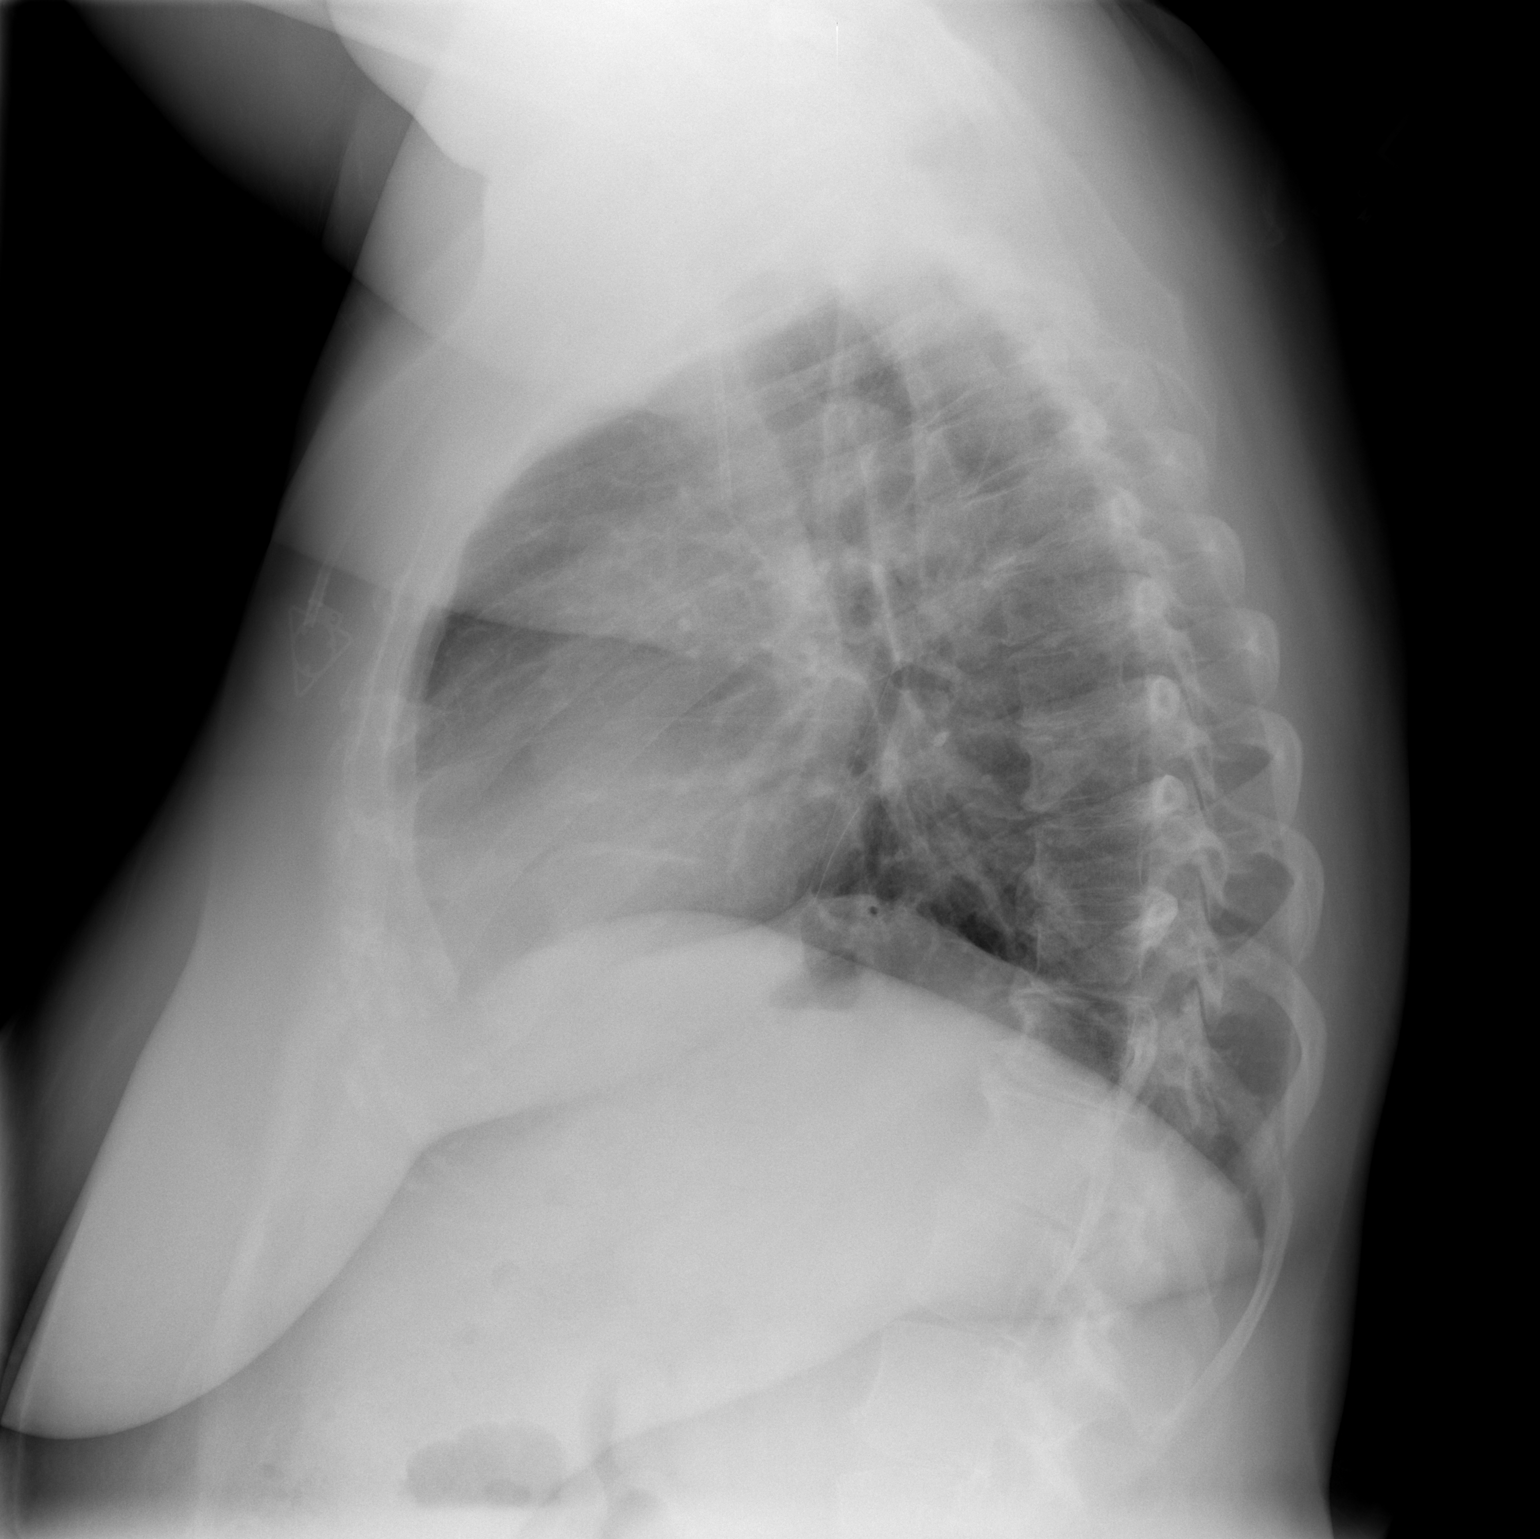

[2 of 2 positions shown; findings below may reference images not displayed]

FINDINGS: Stable Port-A-Cath.  Mild cardiomegaly.  Left basilar
linear atelectasis.  Otherwise clear lungs.  No pneumothorax or
pleural effusion.
IMPRESSION: Left basilar linear atelectasis.

## 2011-12-21 ENCOUNTER — Ambulatory Visit (HOSPITAL_BASED_OUTPATIENT_CLINIC_OR_DEPARTMENT_OTHER)
Admission: RE | Admit: 2011-12-21 | Discharge: 2011-12-21 | Disposition: A | Discharge status: Home / Self Care | Payer: Medicare Other | Source: Ambulatory Visit | Attending: Hematology & Oncology | Admitting: Hematology & Oncology

## 2011-12-21 ENCOUNTER — Other Ambulatory Visit (HOSPITAL_BASED_OUTPATIENT_CLINIC_OR_DEPARTMENT_OTHER): Payer: Medicare Other | Admitting: Lab

## 2011-12-21 ENCOUNTER — Ambulatory Visit (HOSPITAL_BASED_OUTPATIENT_CLINIC_OR_DEPARTMENT_OTHER): Payer: Medicare Other | Admitting: Hematology & Oncology

## 2011-12-21 ENCOUNTER — Ambulatory Visit (HOSPITAL_BASED_OUTPATIENT_CLINIC_OR_DEPARTMENT_OTHER): Payer: Medicare Other

## 2011-12-21 VITALS — BP 119/80 | HR 76 | Temp 97.0°F | Ht 67.0 in | Wt 282.0 lb

## 2011-12-21 DIAGNOSIS — C169 Malignant neoplasm of stomach, unspecified: Secondary | ICD-10-CM

## 2011-12-21 DIAGNOSIS — E119 Type 2 diabetes mellitus without complications: Secondary | ICD-10-CM

## 2011-12-21 DIAGNOSIS — C165 Malignant neoplasm of lesser curvature of stomach, unspecified: Secondary | ICD-10-CM

## 2011-12-21 DIAGNOSIS — Z9221 Personal history of antineoplastic chemotherapy: Secondary | ICD-10-CM | POA: Insufficient documentation

## 2011-12-21 DIAGNOSIS — R110 Nausea: Secondary | ICD-10-CM

## 2011-12-21 DIAGNOSIS — K439 Ventral hernia without obstruction or gangrene: Secondary | ICD-10-CM | POA: Insufficient documentation

## 2011-12-21 DIAGNOSIS — K5730 Diverticulosis of large intestine without perforation or abscess without bleeding: Secondary | ICD-10-CM | POA: Insufficient documentation

## 2011-12-21 DIAGNOSIS — K449 Diaphragmatic hernia without obstruction or gangrene: Secondary | ICD-10-CM | POA: Insufficient documentation

## 2011-12-21 DIAGNOSIS — Z923 Personal history of irradiation: Secondary | ICD-10-CM | POA: Insufficient documentation

## 2011-12-21 DIAGNOSIS — R1013 Epigastric pain: Secondary | ICD-10-CM | POA: Insufficient documentation

## 2011-12-21 DIAGNOSIS — R109 Unspecified abdominal pain: Secondary | ICD-10-CM

## 2011-12-21 LAB — CMP (CANCER CENTER ONLY)
Albumin: 3.4 g/dL (ref 3.3–5.5)
Alkaline Phosphatase: 75 U/L (ref 26–84)
BUN, Bld: 21 mg/dL (ref 7–22)
CO2: 28 mEq/L (ref 18–33)
Glucose, Bld: 152 mg/dL — ABNORMAL HIGH (ref 73–118)
Sodium: 140 mEq/L (ref 128–145)
Total Bilirubin: 0.6 mg/dl (ref 0.20–1.60)
Total Protein: 7.3 g/dL (ref 6.4–8.1)

## 2011-12-21 LAB — CBC WITH DIFFERENTIAL (CANCER CENTER ONLY)
BASO#: 0 10*3/uL (ref 0.0–0.2)
EOS%: 8.1 % — ABNORMAL HIGH (ref 0.0–7.0)
Eosinophils Absolute: 0.5 10*3/uL (ref 0.0–0.5)
HCT: 35.1 % (ref 34.8–46.6)
HGB: 11.7 g/dL (ref 11.6–15.9)
MCH: 27.1 pg (ref 26.0–34.0)
MCHC: 33.3 g/dL (ref 32.0–36.0)
MCV: 81 fL (ref 81–101)
MONO%: 13.9 % — ABNORMAL HIGH (ref 0.0–13.0)
NEUT#: 2.7 10*3/uL (ref 1.5–6.5)
NEUT%: 43.4 % (ref 39.6–80.0)
RBC: 4.32 10*6/uL (ref 3.70–5.32)

## 2011-12-21 MED ORDER — SODIUM CHLORIDE 0.9 % IJ SOLN
10.0000 mL | INTRAMUSCULAR | Status: DC | PRN
  Administered 2011-12-21: 10 mL via INTRAVENOUS
  Filled 2011-12-21: qty 10

## 2011-12-21 MED ORDER — HEPARIN SOD (PORK) LOCK FLUSH 100 UNIT/ML IV SOLN
500.0000 [IU] | Freq: Once | INTRAVENOUS | Status: AC
  Administered 2011-12-21: 500 [IU] via INTRAVENOUS
  Filled 2011-12-21: qty 5

## 2011-12-21 MED ORDER — IOHEXOL 300 MG/ML  SOLN
64.0000 mL | Freq: Once | INTRAMUSCULAR | Status: AC | PRN
  Administered 2011-12-21: 64 mL via INTRAVENOUS

## 2011-12-21 NOTE — Patient Instructions (Signed)
Implanted Port Instructions  An implanted port is a central line that has a round shape and is placed under the skin. It is used for long-term IV (intravenous) access for:   Medicine.   Fluids.   Liquid nutrition, such as TPN (total parenteral nutrition).   Blood samples.  Ports can be placed:   In the chest area just below the collarbone (this is the most common place.)   In the arms.   In the belly (abdomen) area.   In the legs.  PARTS OF THE PORT  A port has 2 main parts:   The reservoir. The reservoir is round, disc-shaped, and will be a small, raised area under your skin.   The reservoir is the part where a needle is inserted (accessed) to either give medicines or to draw blood.   The catheter. The catheter is a long, slender tube that extends from the reservoir. The catheter is placed into a large vein.   Medicine that is inserted into the reservoir goes into the catheter and then into the vein.  INSERTION OF THE PORT   The port is surgically placed in either an operating room or in a procedural area (interventional radiology).   Medicine may be given to help you relax during the procedure.   The skin where the port will be inserted is numbed (local anesthetic).   1 or 2 small cuts (incisions) will be made in the skin to insert the port.   The port can be used after it has been inserted.  INCISION SITE CARE   The incision site may have small adhesive strips on it. This helps keep the incision site closed. Sometimes, no adhesive strips are placed. Instead of adhesive strips, a special kind of surgical glue is used to keep the incision closed.   If adhesive strips were placed on the incision sites, do not take them off. They will fall off on their own.   The incision site may be sore for 1 to 2 days. Pain medicine can help.   Do not get the incision site wet. Bathe or shower as directed by your caregiver.   The incision site should heal in 5 to 7 days. A small scar may form after the  incision has healed.  ACCESSING THE PORT  Special steps must be taken to access the port:   Before the port is accessed, a numbing cream can be placed on the skin. This helps numb the skin over the port site.   A sterile technique is used to access the port.   The port is accessed with a needle. Only "non-coring" port needles should be used to access the port. Once the port is accessed, a blood return should be checked. This helps ensure the port is in the vein and is not clogged (clotted).   If your caregiver believes your port should remain accessed, a clear (transparent) bandage will be placed over the needle site. The bandage and needle will need to be changed every week or as directed by your caregiver.   Keep the bandage covering the needle clean and dry. Do not get it wet. Follow your caregiver's instructions on how to take a shower or bath when the port is accessed.   If your port does not need to stay accessed, no bandage is needed over the port.  FLUSHING THE PORT  Flushing the port keeps it from getting clogged. How often the port is flushed depends on:   If a   constant infusion is running. If a constant infusion is running, the port may not need to be flushed.   If intermittent medicines are given.   If the port is not being used.  For intermittent medicines:   The port will need to be flushed:   After medicines have been given.   After blood has been drawn.   As part of routine maintenance.   A port is normally flushed with:   Normal saline.   Heparin.   Follow your caregiver's advice on how often, how much, and the type of flush to use on your port.  IMPORTANT PORT INFORMATION   Tell your caregiver if you are allergic to heparin.   After your port is placed, you will get a manufacturer's information card. The card has information about your port. Keep this card with you at all times.   There are many types of ports available. Know what kind of port you have.   In case of an  emergency, it may be helpful to wear a medical alert bracelet. This can help alert health care workers that you have a port.   The port can stay in for as long as your caregiver believes it is necessary.   When it is time for the port to come out, surgery will be done to remove it. The surgery will be similar to how the port was put in.   If you are in the hospital or clinic:   Your port will be taken care of and flushed by a nurse.   If you are at home:   A home health care nurse may give medicines and take care of the port.   You or a family member can get special training and directions for giving medicine and taking care of the port at home.  SEEK IMMEDIATE MEDICAL CARE IF:    Your port does not flush or you are unable to get a blood return.   New drainage or pus is coming from the incision.   A bad smell is coming from the incision site.   You develop swelling or increased redness at the incision site.   You develop increased swelling or pain at the port site.   You develop swelling or pain in the surrounding skin near the port.   You have an oral temperature above 102 F (38.9 C), not controlled by medicine.  MAKE SURE YOU:    Understand these instructions.   Will watch your condition.   Will get help right away if you are not doing well or get worse.  Document Released: 07/11/2005 Document Revised: 06/30/2011 Document Reviewed: 10/02/2008  [employer] Patient Information 2012 [employer].

## 2011-12-21 NOTE — Progress Notes (Signed)
This office note has been dictated.

## 2011-12-22 NOTE — Progress Notes (Signed)
CC:   Ardyth Gal. Spruill, M.D. Tory Emerald Benson Norway, MD  DIAGNOSES: 1. 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 2. Insulin dependent diabetes, marginal control.  CURRENT THERAPY:  Observation.  INTERIM HISTORY:  Ms. Hodgkiss comes in for followup.  I saw her last back in April.  At that point in time, she had a little bit of bronchitis. This is all better.  The problem now is that she said she is having some more nausea and vomiting.  She said this seems to happen after she eats. She said that she can eat breakfast okay but after that, she does have a little bit of a hard time. She has a little bit more  nausea with lunch and dinner.  She has not had an upper endoscopy since I think her surgery.  That was a couple of years ago.  I did speak to Dr. Benson Norway of Gastroenterology.  I think she may need to have an upper endoscopy done.  Again, her blood sugars are marginally controlled.  [name] is doing his best to try to get them under control.  She is having no problems with diarrhea.  She is having no leg swelling. There have been no rashes.  She has had no bleeding.  She also complains of pain on the right side.  This is been on-and-off kind of pain for several months.  PHYSICAL EXAMINATION:  This is a obese, black female in no obvious distress.  Vital signs:  97, pulse 76, respiratory rate 18, blood pressure 119/80.  Weight is 282.  Head and neck:  Normocephalic, atraumatic skull.  There are no ocular or oral lesions.  No palpable cervical or supraclavicular lymph nodes.  Lungs: Clear to percussion and auscultation bilaterally.  Cardiac:  Regular rate and rhythm with a normal S1 and S2.  There are no murmurs, rubs or bruits.  Abdomen: Soft with good bowel sounds.  There is no palpable abdominal mass.  There is no palpable hepatosplenomegaly.  Back: No tenderness over the spine, ribs, or hips.  Extremities:  Some trace edema in her lower legs.  LABORATORY STUDIES:  White  cell count is 6.2, hemoglobin 11.7, hematocrit 35.1, platelet count 332.  IMPRESSION:  Ms. Fulfer is a 61 year old African American female with a history of stage IB adenocarcinoma of the stomach.  She did undergo a partial gastrectomy.  She had 2 lymph nodes positive.  She underwent adjuvant chemo and radiation therapy.  She did okay with this.  I am not sure what is going on with this abdominal issue or lower right flank/ right-sided issue.  I think we will have to get her set up with an upper endoscopy and a CT scan.  It has now been 2-1/2 years since she completed therapy.  She completed therapy back in November 2010.  Again, she did have 2 positive lymph nodes.  We will plan to get her back to see [place] in another couple of months for followup.  The patient will get her Port-A-Cath flushed today.    ______________________________ Volanda Napoleon, M.D. PRE/MEDQ  D:  12/21/2011  T:  12/22/2011  Job:  [id number]

## 2011-12-27 ENCOUNTER — Other Ambulatory Visit: Payer: Self-pay | Admitting: Cardiology

## 2011-12-27 DIAGNOSIS — R091 Pleurisy: Secondary | ICD-10-CM

## 2011-12-27 DIAGNOSIS — R079 Chest pain, unspecified: Secondary | ICD-10-CM

## 2012-01-13 ENCOUNTER — Other Ambulatory Visit: Payer: Self-pay | Admitting: *Deleted

## 2012-01-13 DIAGNOSIS — IMO0001 Reserved for inherently not codable concepts without codable children: Secondary | ICD-10-CM

## 2012-01-13 MED ORDER — ESOMEPRAZOLE MAGNESIUM 40 MG PO CPDR: 40.0000 mg | DELAYED_RELEASE_CAPSULE | Freq: Two times a day (BID) | ORAL | Status: DC

## 2012-01-20 IMAGING — CT CT CHEST W/ CM
2 of 5 series · 15 of 36 positions shown, 18 images · IV contrast (agent unspecified)
Comparison: Plain film 08/22/2010 chest.  CTs 06/10/2010.

CT CHEST

CLINICAL DATA: History gastric cancer.  Radiation therapy and
chemotherapy completed [DATE].  Cough.

CT CHEST, ABDOMEN AND PELVIS WITH CONTRAST
TECHNIQUE: Contiguous axial images of the chest abdomen and pelvis
were obtained after IV contrast administration.
Contrast: 125 ml Qmnipaque-SGG

[Series 2: cap with xxl st · axial · 0.86mm/px · z∈[-620,-65]mm · 12 of 127 slices shown, 15 images]
[im 8/127  mediastinal]
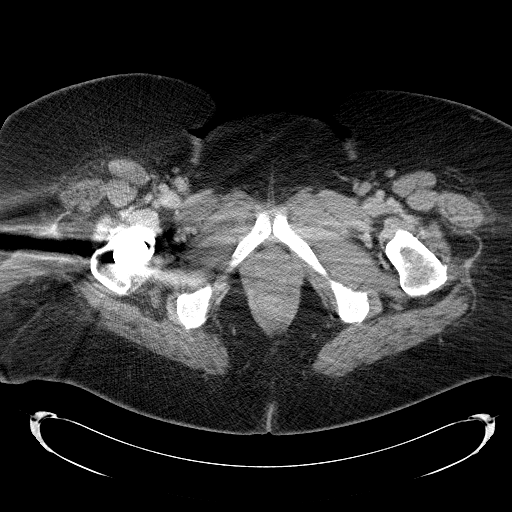
[im 8/127  lung]
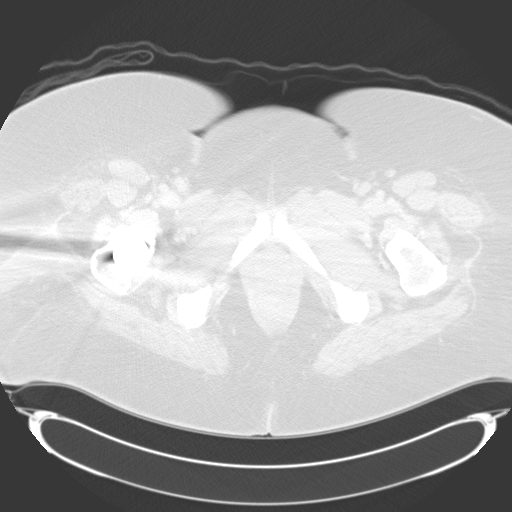
[im 16/127  lung]
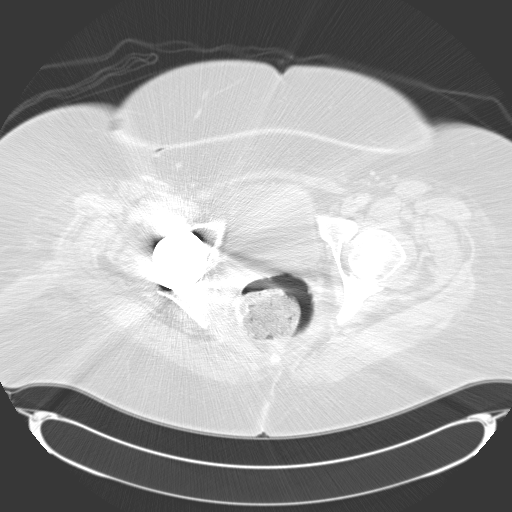
[im 32/127  lung]
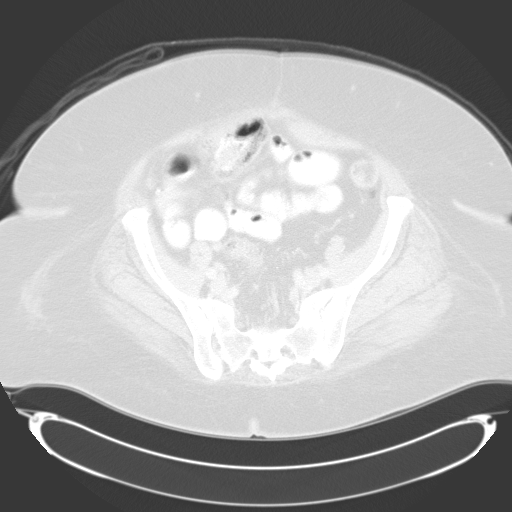
[im 40/127  lung]
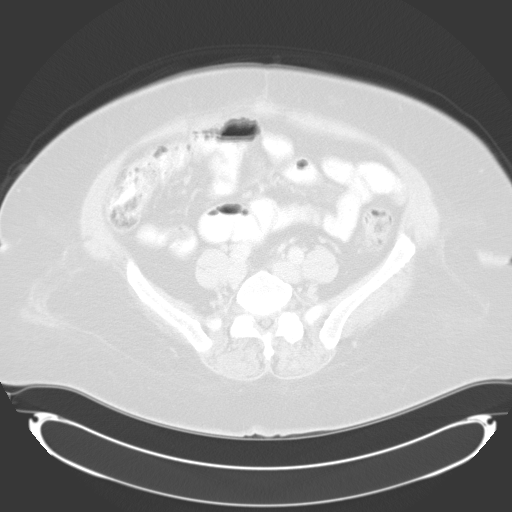
[im 48/127  mediastinal]
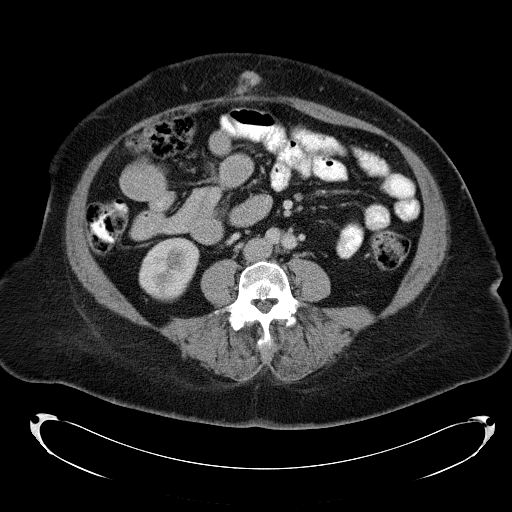
[im 48/127  lung]
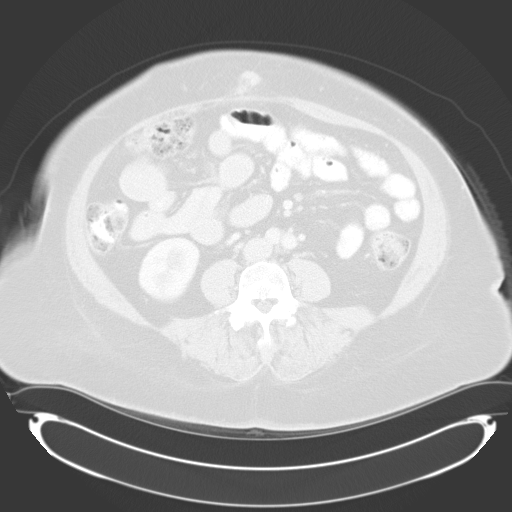
[im 56/127  lung]
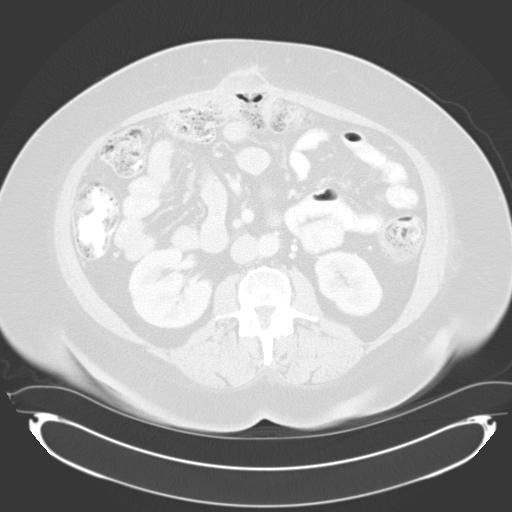
[im 71/127  lung]
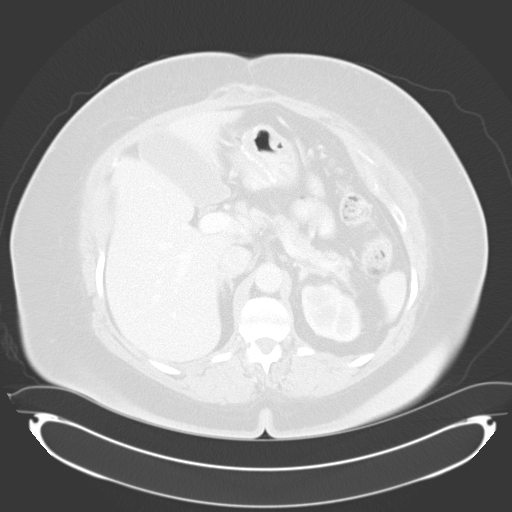
[im 79/127  lung]
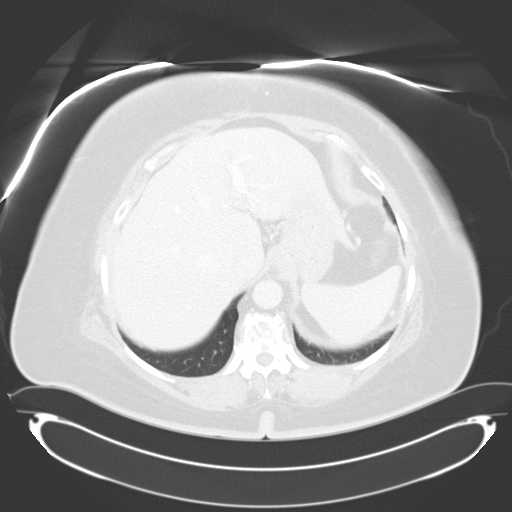
[im 87/127  mediastinal]
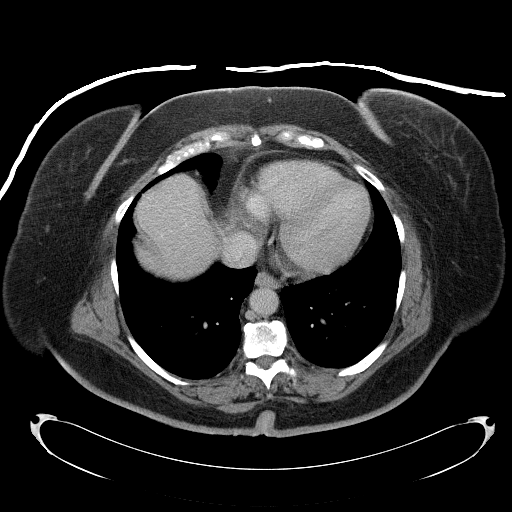
[im 87/127  lung]
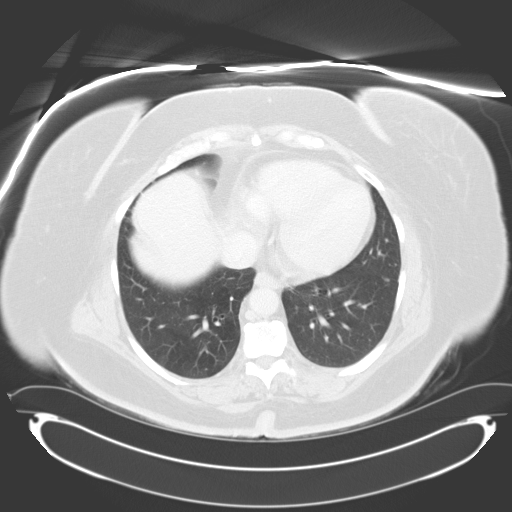
[im 95/127  lung]
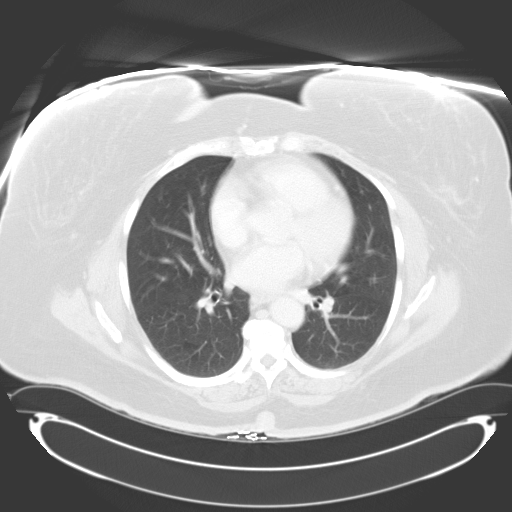
[im 111/127  lung]
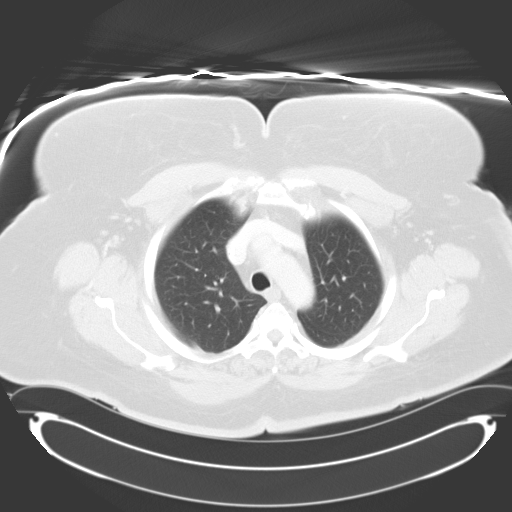
[im 119/127  lung]
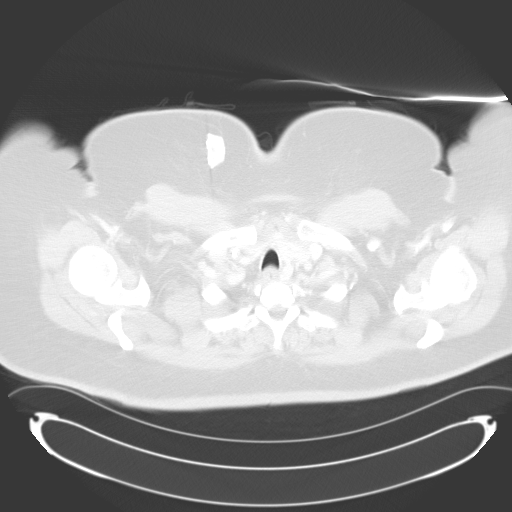

[Series 602: coronal images · coronal · 1.28mm/px · 3 of 104 slices shown]
[im 21/104  lung]
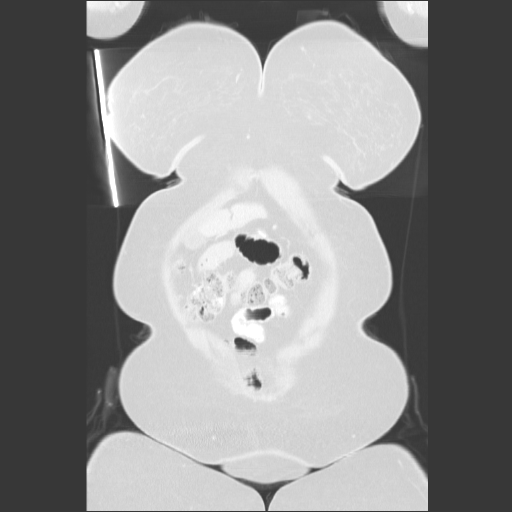
[im 42/104  lung]
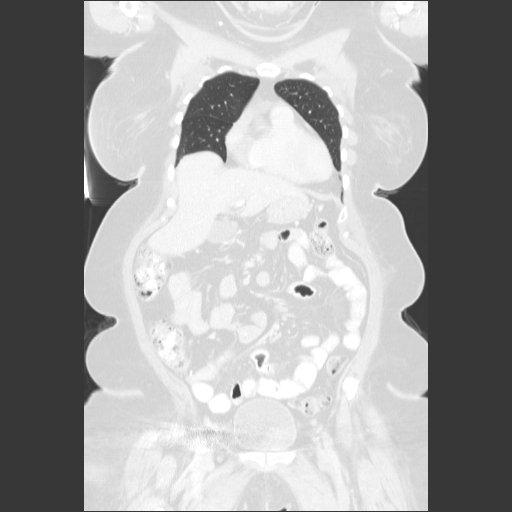
[im 62/104  lung]
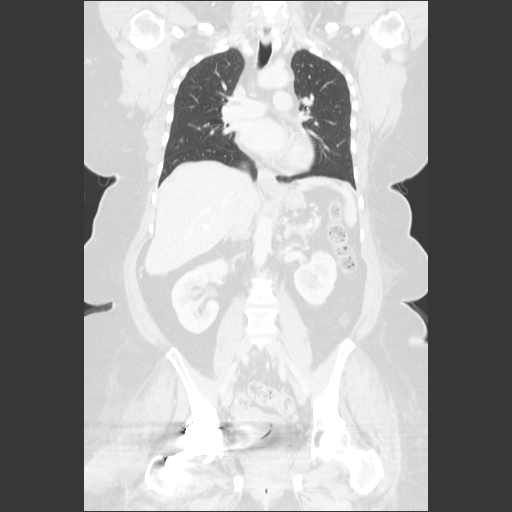

[15 of 36 positions shown; findings below may reference images not displayed]

FINDINGS: Lung windows demonstrate no nodules, airspace opacities.

Soft tissue windows demonstrate a right-sided Port-A-Cath.  This
terminates at the cavoatrial junction.  Enlarged heterogeneous
thyroid again identified.  Tortuous descending thoracic aorta.
Moderate cardiomegaly. No pericardial or pleural effusion.
Pulmonary artery enlargement at 3.7 cm (upper limits of normal
cm). No mediastinal or hilar adenopathy.  A tiny hiatal hernia
IMPRESSION: 1. No acute process or evidence of metastatic disease in the chest.
2.  Pulmonary artery enlargement suggests pulmonary arterial
hypertension.
3.  Moderate cardiomegaly.
4.  Tiny hiatal hernia.

CT ABDOMEN AND PELVIS
FINDINGS: Prominent right lobe of the liver, unchanged.  The left
lobe of the liver extends in the left upper quadrant.  2 adjacent
liver lesions on image 46 of series 2 are likely present back on
07/13/2009.  Likely tiny cysts.

No new liver lesions.  Tiny splenule.  The greater curvature
proximal stomach appears thick-walled.  This is likely due to
underdistension.  Example 2.7 cm on image 51.  No well-defined mass
identified.  Surgical sutures identified within the junction of the
gastric body and antrum, likely representing  gastrojejunostomy.

Normal gallbladder, biliary tract.  Mild pancreatic atrophy.
Normal adrenal glands and kidneys. No retroperitoneal or
retrocrural adenopathy.

Beam hardening artifact from right hip arthroplasty degrades
evaluation of the pelvis.  Sigmoid diverticulosis. Colonic stool
burden suggests constipation.

Motion degradation within the lower abdomen and upper pelvis.
Normal small bowel without abdominal ascites.

  No pelvic adenopathy.  Normal urinary bladder and uterus without
adnexal mass or definite free pelvic fluid.  Fascial thickening
within the anterior abdominal wall which is likely postoperative.
Similar in appearance to on the prior exam.  Example image 69.
Postoperative scarring also identified superficial the right hip
arthroplasty.

No acute osseous abnormality.
IMPRESSION: 1.  Surgical changes of partial gastrectomy.  No evidence of
metastatic disease.
2.  The greater curvature of the proximal stomach appears thick-
walled.  This could be due to underdistension. Depending on
clinical suspicion, endoscopy could be performed.
3.  Degraded by mild motion artifact within the lower abdomen/upper
pelvis and interval right hip arthroplasty with resultant beam
hardening artifact.
4.  Possible constipation.
5.  2 subtle left liver lobe lesions which are felt to represent
cysts and be present on prior exams. Recommend attention on follow-
up.

## 2012-02-01 ENCOUNTER — Ambulatory Visit (HOSPITAL_COMMUNITY): Admission: RE | Admit: 2012-02-01 | Payer: Medicare Other | Source: Ambulatory Visit

## 2012-02-03 ENCOUNTER — Ambulatory Visit (HOSPITAL_COMMUNITY)
Admission: RE | Admit: 2012-02-03 | Discharge: 2012-02-03 | Disposition: A | Discharge status: Home / Self Care | Payer: Medicare Other | Source: Ambulatory Visit | Attending: Cardiology | Admitting: Cardiology

## 2012-02-03 DIAGNOSIS — M79609 Pain in unspecified limb: Secondary | ICD-10-CM | POA: Insufficient documentation

## 2012-02-03 DIAGNOSIS — E785 Hyperlipidemia, unspecified: Secondary | ICD-10-CM | POA: Insufficient documentation

## 2012-02-03 DIAGNOSIS — E1149 Type 2 diabetes mellitus with other diabetic neurological complication: Secondary | ICD-10-CM | POA: Insufficient documentation

## 2012-02-03 DIAGNOSIS — R0989 Other specified symptoms and signs involving the circulatory and respiratory systems: Secondary | ICD-10-CM

## 2012-02-03 DIAGNOSIS — C169 Malignant neoplasm of stomach, unspecified: Secondary | ICD-10-CM

## 2012-02-03 DIAGNOSIS — I10 Essential (primary) hypertension: Secondary | ICD-10-CM

## 2012-02-03 DIAGNOSIS — E1142 Type 2 diabetes mellitus with diabetic polyneuropathy: Secondary | ICD-10-CM | POA: Insufficient documentation

## 2012-02-03 DIAGNOSIS — E119 Type 2 diabetes mellitus without complications: Secondary | ICD-10-CM

## 2012-02-03 LAB — COMPREHENSIVE METABOLIC PANEL
ALT: 12 U/L (ref 0–35)
Albumin: 3.5 g/dL (ref 3.5–5.2)
Alkaline Phosphatase: 82 U/L (ref 39–117)
Chloride: 105 mEq/L (ref 96–112)
Glucose, Bld: 140 mg/dL — ABNORMAL HIGH (ref 70–99)
Potassium: 4.8 mEq/L (ref 3.5–5.1)
Sodium: 141 mEq/L (ref 135–145)
Total Bilirubin: 0.2 mg/dL — ABNORMAL LOW (ref 0.3–1.2)
Total Protein: 7.7 g/dL (ref 6.0–8.3)

## 2012-02-03 LAB — URINALYSIS, ROUTINE W REFLEX MICROSCOPIC
Bilirubin Urine: NEGATIVE
Ketones, ur: NEGATIVE mg/dL
Nitrite: NEGATIVE
Protein, ur: NEGATIVE mg/dL
Urobilinogen, UA: 0.2 mg/dL (ref 0.0–1.0)

## 2012-02-03 LAB — LIPID PANEL
HDL: 46 mg/dL (ref >39)
LDL Cholesterol: 110 mg/dL — ABNORMAL HIGH (ref 0–99)
Triglycerides: 147 mg/dL (ref <150)
VLDL: 29 mg/dL (ref 0–40)

## 2012-02-03 NOTE — Progress Notes (Signed)
VASCULAR LAB PRELIMINARY  ARTERIAL  ABI completed:    RIGHT    LEFT    PRESSURE WAVEFORM  PRESSURE WAVEFORM  BRACHIAL 132 Triphasic BRACHIAL 132 Triphasic  DP 129 Triphasic DP 131 Tri[phasic         PT 152 Triphasic PT 136 Triphasic              1.15    RIGHT LEFT  ABI 1.15 1.03   ABIs and Doppler waveforms are within normal limits.  Aquanetta Schwarz, RVS 02/03/2012, 9:58 AM

## 2012-02-15 ENCOUNTER — Other Ambulatory Visit: Payer: Self-pay | Admitting: Cardiology

## 2012-02-16 ENCOUNTER — Other Ambulatory Visit: Payer: Self-pay | Admitting: Cardiology

## 2012-02-16 DIAGNOSIS — R42 Dizziness and giddiness: Secondary | ICD-10-CM

## 2012-02-17 ENCOUNTER — Ambulatory Visit
Admission: RE | Admit: 2012-02-17 | Discharge: 2012-02-17 | Disposition: A | Discharge status: Home / Self Care | Payer: Medicare Other | Source: Ambulatory Visit | Attending: Cardiology | Admitting: Cardiology

## 2012-02-17 ENCOUNTER — Other Ambulatory Visit: Payer: Self-pay | Admitting: *Deleted

## 2012-02-17 DIAGNOSIS — R42 Dizziness and giddiness: Secondary | ICD-10-CM

## 2012-02-17 DIAGNOSIS — I10 Essential (primary) hypertension: Secondary | ICD-10-CM

## 2012-02-17 MED ORDER — TRIAMTERENE-HCTZ 37.5-25 MG PO CAPS: 1.0000 | ORAL_CAPSULE | Freq: Every day | ORAL | Status: DC

## 2012-02-17 NOTE — Telephone Encounter (Signed)
Received refill authorization for trimterene/hctz 37.5/25 mg cap. Refilled via [facility] as this is a chronic med but will try to transition to [name] as he manages her other chronic conditions i.e diabetes.

## 2012-02-20 ENCOUNTER — Ambulatory Visit: Payer: Medicare Other | Admitting: Hematology & Oncology

## 2012-02-20 ENCOUNTER — Other Ambulatory Visit: Payer: Medicare Other | Admitting: Lab

## 2012-03-05 ENCOUNTER — Telehealth: Payer: Self-pay | Admitting: Hematology & Oncology

## 2012-03-05 NOTE — Telephone Encounter (Signed)
Left pt message to call and reschedule missed appointment

## 2012-03-08 ENCOUNTER — Telehealth: Payer: Self-pay | Admitting: Hematology & Oncology

## 2012-03-08 NOTE — Telephone Encounter (Signed)
Pt made 9-13 appointment. Didn't want to come next week

## 2012-04-06 ENCOUNTER — Other Ambulatory Visit (HOSPITAL_BASED_OUTPATIENT_CLINIC_OR_DEPARTMENT_OTHER): Payer: Medicare Other | Admitting: Lab

## 2012-04-06 ENCOUNTER — Ambulatory Visit (HOSPITAL_BASED_OUTPATIENT_CLINIC_OR_DEPARTMENT_OTHER): Payer: Medicare Other | Admitting: Hematology & Oncology

## 2012-04-06 ENCOUNTER — Ambulatory Visit: Payer: Medicare Other

## 2012-04-06 VITALS — BP 106/73 | HR 74 | Temp 98.2°F | Resp 20 | Ht 67.0 in | Wt 275.0 lb

## 2012-04-06 DIAGNOSIS — C169 Malignant neoplasm of stomach, unspecified: Secondary | ICD-10-CM

## 2012-04-06 DIAGNOSIS — E119 Type 2 diabetes mellitus without complications: Secondary | ICD-10-CM

## 2012-04-06 DIAGNOSIS — C165 Malignant neoplasm of lesser curvature of stomach, unspecified: Secondary | ICD-10-CM

## 2012-04-06 DIAGNOSIS — M810 Age-related osteoporosis without current pathological fracture: Secondary | ICD-10-CM

## 2012-04-06 DIAGNOSIS — C779 Secondary and unspecified malignant neoplasm of lymph node, unspecified: Secondary | ICD-10-CM

## 2012-04-06 LAB — CBC WITH DIFFERENTIAL (CANCER CENTER ONLY)
BASO#: 0 10*3/uL (ref 0.0–0.2)
BASO%: 0.4 % (ref 0.0–2.0)
EOS%: 4.2 % (ref 0.0–7.0)
MCH: 27.5 pg (ref 26.0–34.0)
MCHC: 33.8 g/dL (ref 32.0–36.0)
MONO%: 11.2 % (ref 0.0–13.0)
NEUT#: 4.2 10*3/uL (ref 1.5–6.5)
Platelets: 351 10*3/uL (ref 145–400)
RDW: 15.2 % (ref 11.1–15.7)

## 2012-04-06 LAB — COMPREHENSIVE METABOLIC PANEL
AST: 12 U/L (ref 0–37)
Albumin: 3.8 g/dL (ref 3.5–5.2)
Alkaline Phosphatase: 69 U/L (ref 39–117)
BUN: 18 mg/dL (ref 6–23)
Potassium: 4.9 mEq/L (ref 3.5–5.3)

## 2012-04-06 MED ORDER — SODIUM CHLORIDE 0.9 % IJ SOLN
10.0000 mL | INTRAMUSCULAR | Status: DC | PRN
  Administered 2012-04-06: 10 mL via INTRAVENOUS
  Filled 2012-04-06: qty 10

## 2012-04-06 MED ORDER — HEPARIN SOD (PORK) LOCK FLUSH 100 UNIT/ML IV SOLN
500.0000 [IU] | Freq: Once | INTRAVENOUS | Status: AC | PRN
  Administered 2012-04-06: 500 [IU] via INTRAVENOUS
  Filled 2012-04-06: qty 5

## 2012-04-06 NOTE — Progress Notes (Signed)
CC:   [name], M.D.  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  CURRENT THERAPY:  Observation.  INTERIM HISTORY:  [name] comes in for followup.  We see her every 3 or 4 months.  She does have a Port-A-Cath that we still have to flush.  She has done real well with respect to her stomach cancer.  She underwent a partial gastrectomy.  She had adjuvant chemo and radiation therapy.  She completed this almost 3 years ago back in November 2010.  Her diabetes has not been a real problem for her.  She actually says she does not take insulin everyday because of low blood sugars.  The patient is trying to lose weight.  She is doing a good job at this.  She has had no change in bowel or bladder habits.  There has been no cough.  She has had no fever, sweats, or chills.  Of note, her last mammogram was back in February of this year. Everything looked fine.  Her last CAT scan was done back in July.  This was a CAT scan of the head.  This was because of headaches and vertigo.  Everything looked fine without any changes to suggest metastasis.  She has some chronic microvascular ischemic changes.  PHYSICAL EXAMINATION:  General:  This is a well-developed, well nourished black female in no obvious distress.  Vital Signs: Temperature 98.2, pulse 74, respiratory rate 20, blood pressure 106/73, weight is 275.  Head and Neck Exam:  Shows a normocephalic, atraumatic skull.  There are no ocular or oral lesions.  There are no palpable cervical or supraclavicular lymph nodes.  Lungs:  Clear bilaterally. Cardiac Exam:  Regular rate and rhythm with a normal S1 and S2.  There are no murmurs, rubs, or bruits.  Abdominal Exam:  Soft with good bowel sounds.  There is no palpable abdominal mass.  There is no fluid wave. There is no palpable hepatosplenomegaly.  She has a well-healed laparotomy scar from her partial gastrectomy.  Extremities:  Show no clubbing, cyanosis, or  edema.  Skin Exam:  No rashes.  Neurological Exam:  No focal neurological deficits.  LABORATORY STUDIES:  White cell count is 7.6, hemoglobin 11.4, hematocrit 33.7, platelet count 351.  IMPRESSION:  [name] is a 61 year old African American female with a past history of resected adenocarcinoma of the stomach.  She had stage IB disease.  She had 2 positive lymph nodes.  Again, she completed adjuvant chemo and radiation therapy in November 2010.  She likes to have a Port-A-Cath in.  This really helps with respect to blood work and any kind of supportive care measures that are necessary for her.  We will go ahead and flush her Port-A-Cath today.  We will continue to get her back every 6 weeks for a Port-A-Cath flush.  We will plan to see her back ourselves in 3 more months.    ______________________________ Volanda Napoleon, M.D. PRE/MEDQ  D:  04/06/2012  T:  04/06/2012  Job:  [id number]

## 2012-04-06 NOTE — Progress Notes (Signed)
This office note has been dictated.

## 2012-05-11 ENCOUNTER — Other Ambulatory Visit: Payer: Self-pay | Admitting: *Deleted

## 2012-05-11 DIAGNOSIS — IMO0001 Reserved for inherently not codable concepts without codable children: Secondary | ICD-10-CM

## 2012-05-11 DIAGNOSIS — C169 Malignant neoplasm of stomach, unspecified: Secondary | ICD-10-CM

## 2012-05-11 MED ORDER — ESOMEPRAZOLE MAGNESIUM 40 MG PO CPDR: 40.0000 mg | DELAYED_RELEASE_CAPSULE | Freq: Two times a day (BID) | ORAL | Status: DC

## 2012-05-11 NOTE — Telephone Encounter (Signed)
Received refill authorization for Nexium from [employer]. Refilled via [facility] as this is a chronic med for s/p stomach cancer and GERD like symptoms.

## 2012-05-18 ENCOUNTER — Ambulatory Visit: Payer: Medicare Other

## 2012-05-18 NOTE — Progress Notes (Signed)
Patient did not show for appt

## 2012-05-28 ENCOUNTER — Telehealth: Payer: Self-pay | Admitting: Hematology & Oncology

## 2012-05-28 NOTE — Telephone Encounter (Signed)
Pt made 11-7 flush appointment

## 2012-05-30 ENCOUNTER — Other Ambulatory Visit: Payer: Self-pay | Admitting: Oncology

## 2012-05-31 ENCOUNTER — Ambulatory Visit (HOSPITAL_BASED_OUTPATIENT_CLINIC_OR_DEPARTMENT_OTHER): Payer: Medicare Other

## 2012-05-31 VITALS — BP 119/63 | HR 79 | Temp 98.2°F

## 2012-05-31 DIAGNOSIS — C169 Malignant neoplasm of stomach, unspecified: Secondary | ICD-10-CM

## 2012-05-31 DIAGNOSIS — C165 Malignant neoplasm of lesser curvature of stomach, unspecified: Secondary | ICD-10-CM

## 2012-05-31 DIAGNOSIS — Z452 Encounter for adjustment and management of vascular access device: Secondary | ICD-10-CM

## 2012-05-31 MED ORDER — HEPARIN SOD (PORK) LOCK FLUSH 100 UNIT/ML IV SOLN
500.0000 [IU] | Freq: Once | INTRAVENOUS | Status: AC | PRN
  Administered 2012-05-31: 500 [IU] via INTRAVENOUS
  Filled 2012-05-31: qty 5

## 2012-05-31 MED ORDER — SODIUM CHLORIDE 0.9 % IJ SOLN
10.0000 mL | INTRAMUSCULAR | Status: DC | PRN
  Administered 2012-05-31: 10 mL via INTRAVENOUS
  Filled 2012-05-31: qty 10

## 2012-06-12 IMAGING — CR DG CHEST 2V
2 series · 2 of 2 positions shown · non-contrast
Comparison: 08/22/2010

CLINICAL DATA: Chest pain.

CHEST - 2 VIEW

[w chest pa *]
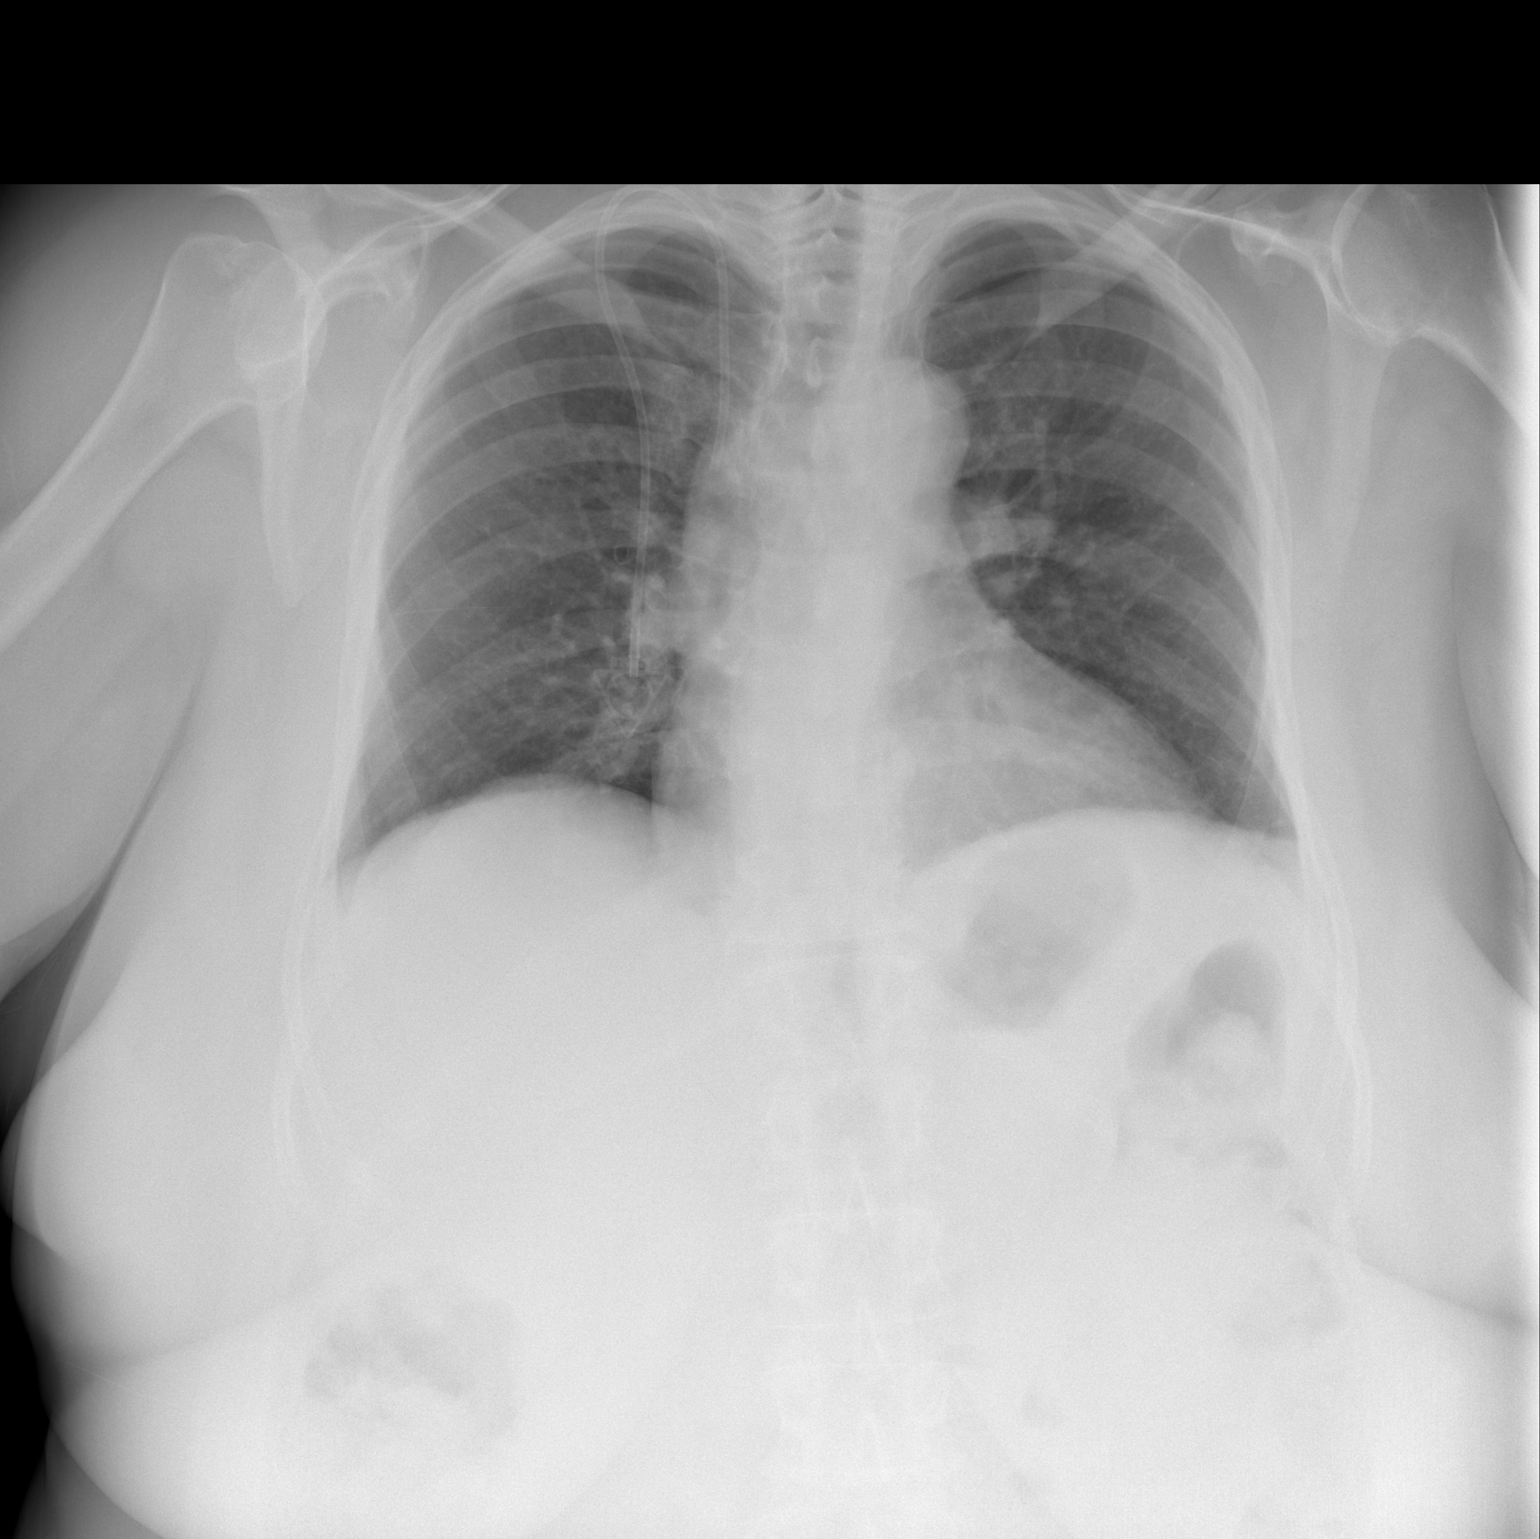

[w chest lat *]
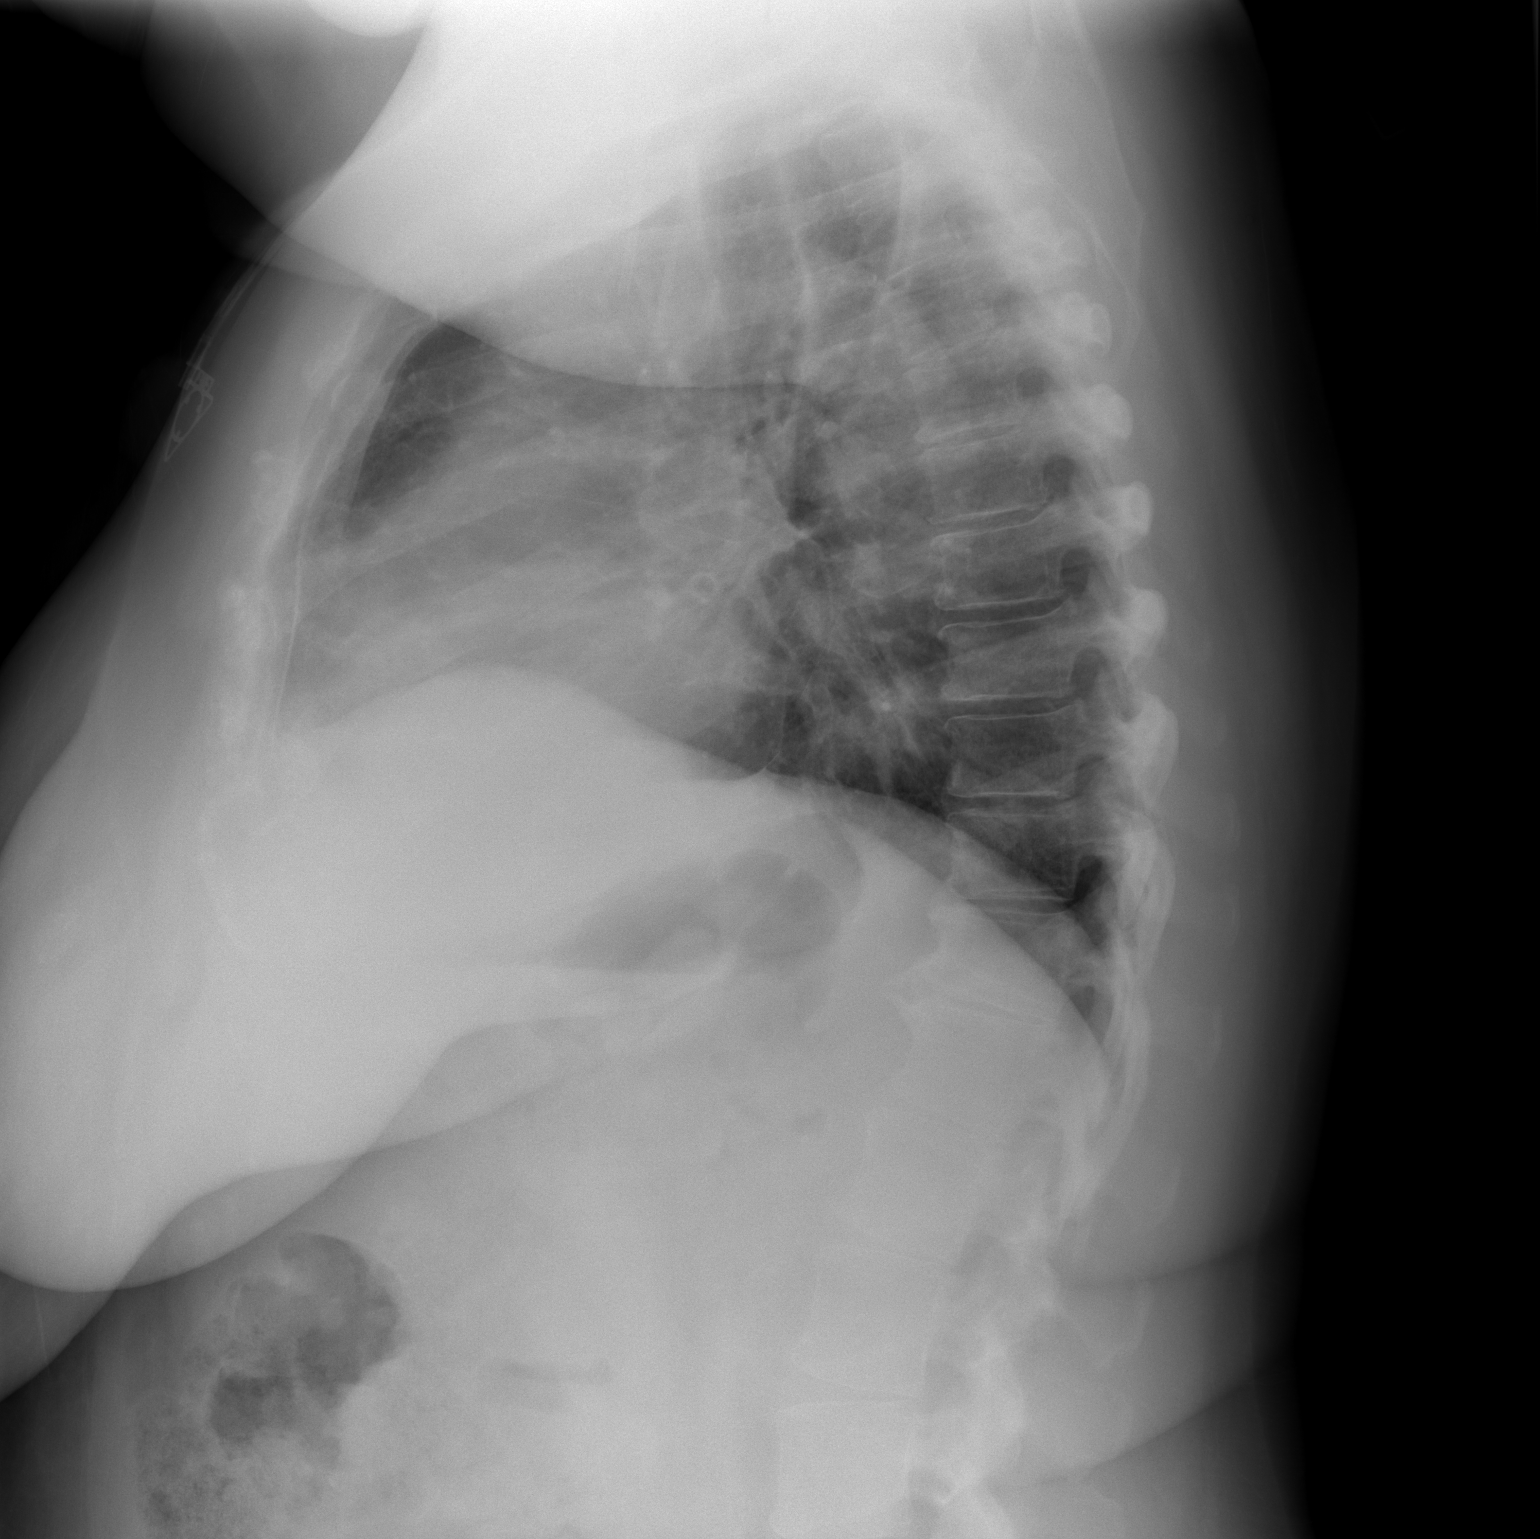

[2 of 2 positions shown; findings below may reference images not displayed]

FINDINGS: Right Port-A-Cath remains in place, unchanged. Heart and
mediastinal contours are within normal limits.  No focal opacities
or effusions.  No acute bony abnormality.
IMPRESSION: No active cardiopulmonary disease.

## 2012-06-12 IMAGING — US US ABDOMEN COMPLETE
1 series · 14 of 25 positions shown · non-contrast
Comparison: CT 02/09/2010

CLINICAL DATA: Abdominal pain

ABDOMINAL ULTRASOUND COMPLETE

[Series 1: us abdomen complete · 0.33mm/px · 14 of 71 slices shown]
[im 1/71]
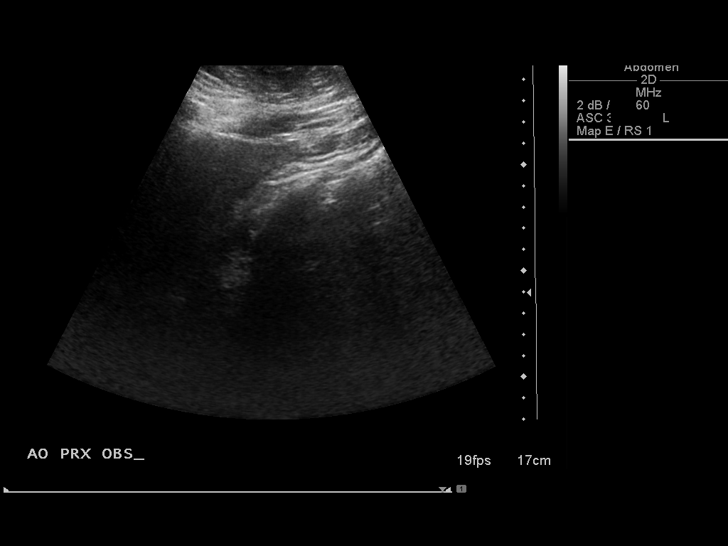
[im 6/71]
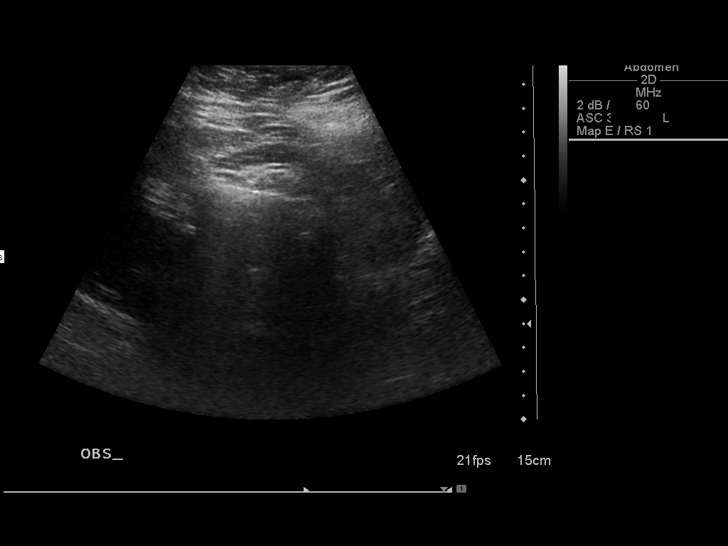
[im 12/71]
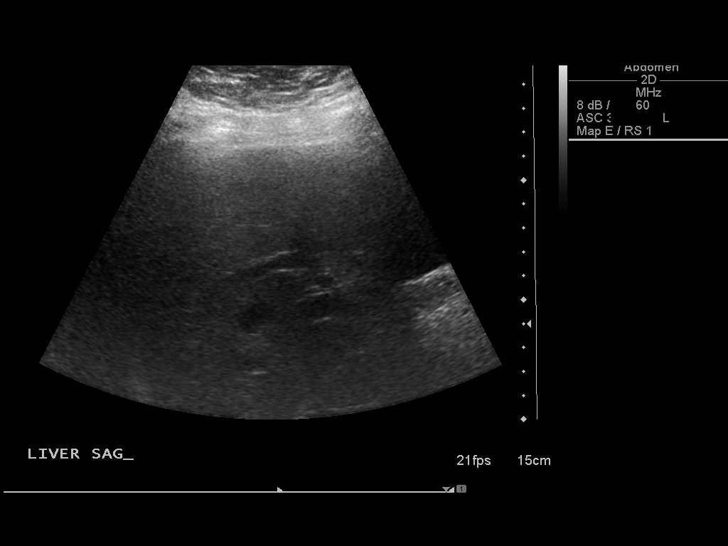
[im 18/71]
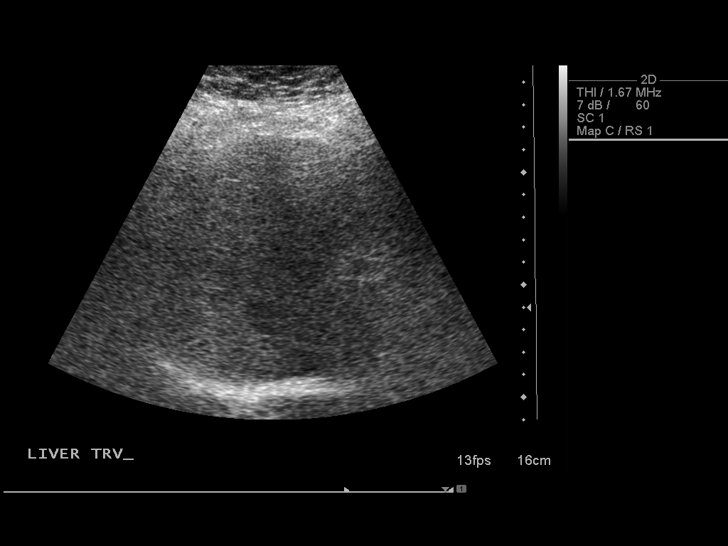
[im 24/71]
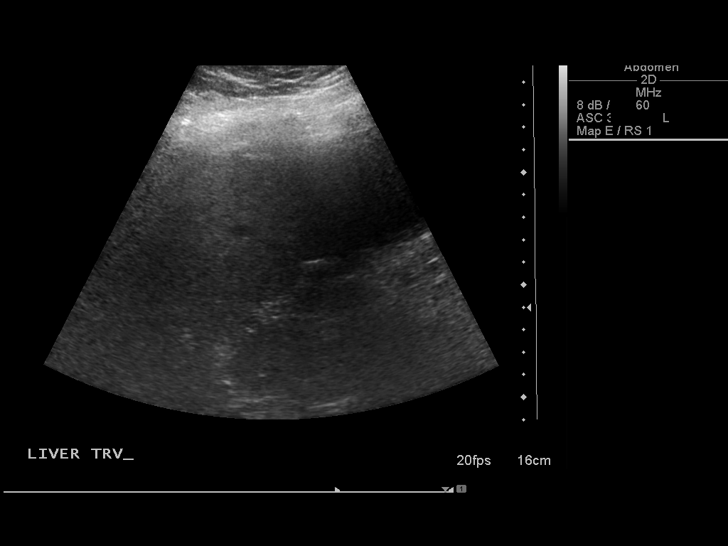
[im 27/71]
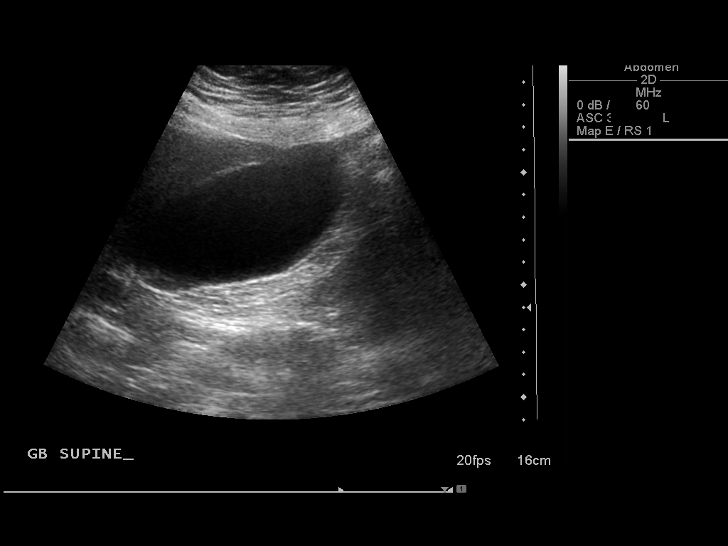
[im 33/71]
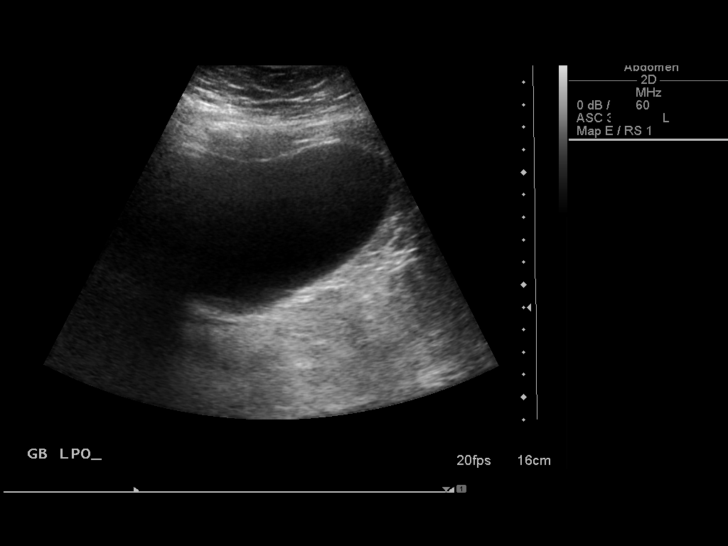
[im 38/71]
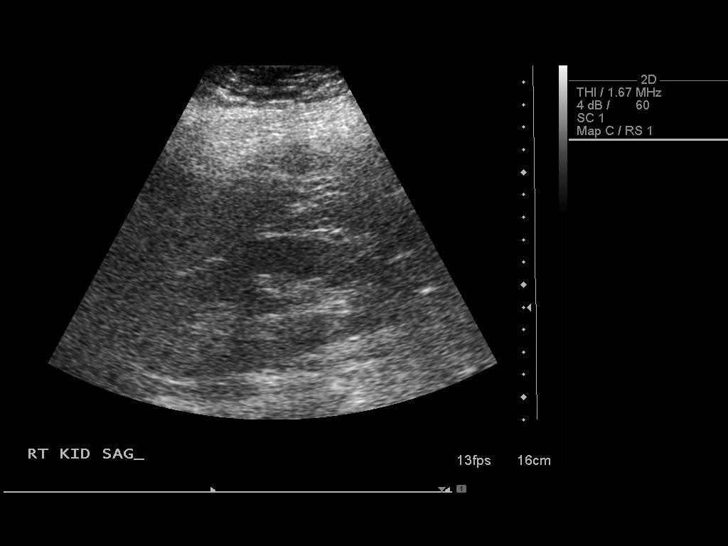
[im 44/71]
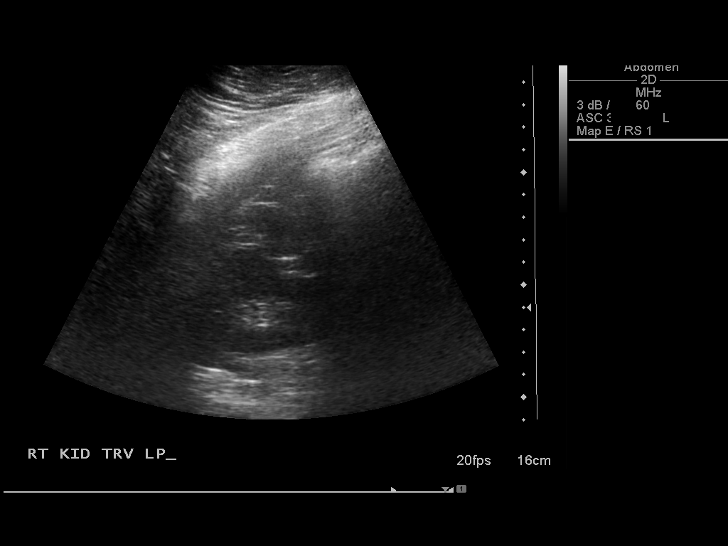
[im 47/71]
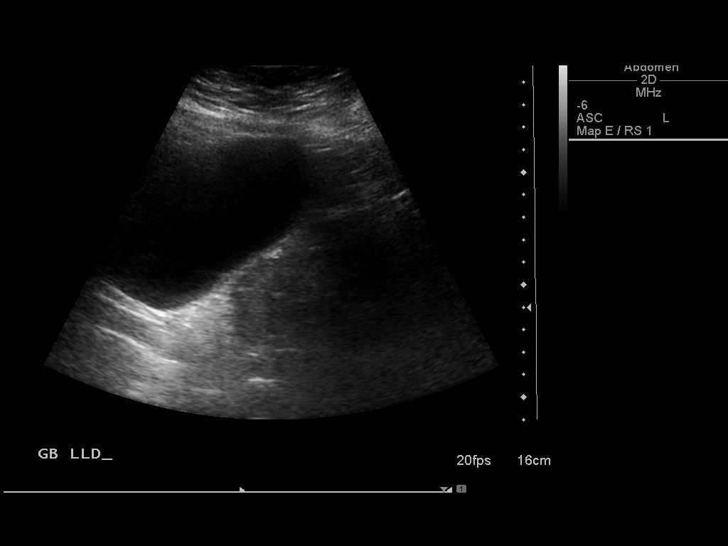
[im 53/71]
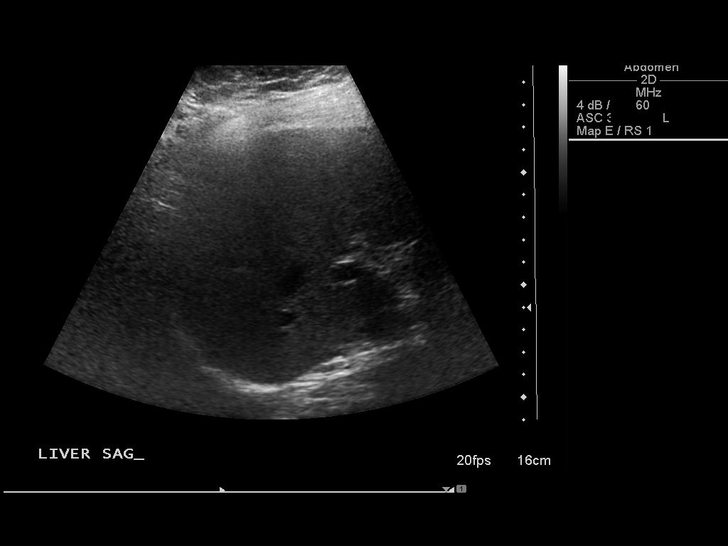
[im 59/71]
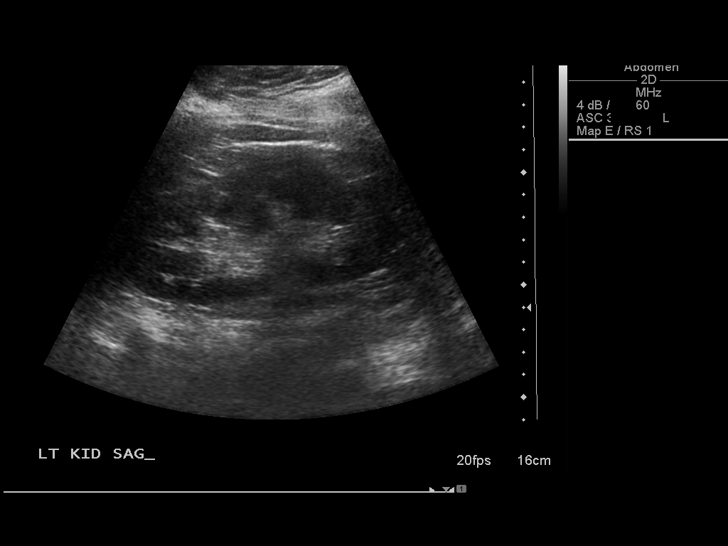
[im 65/71]
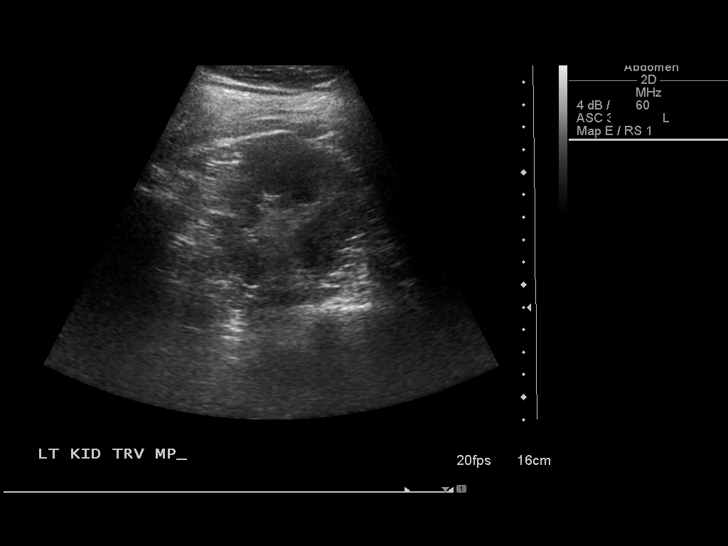
[im 71/71]
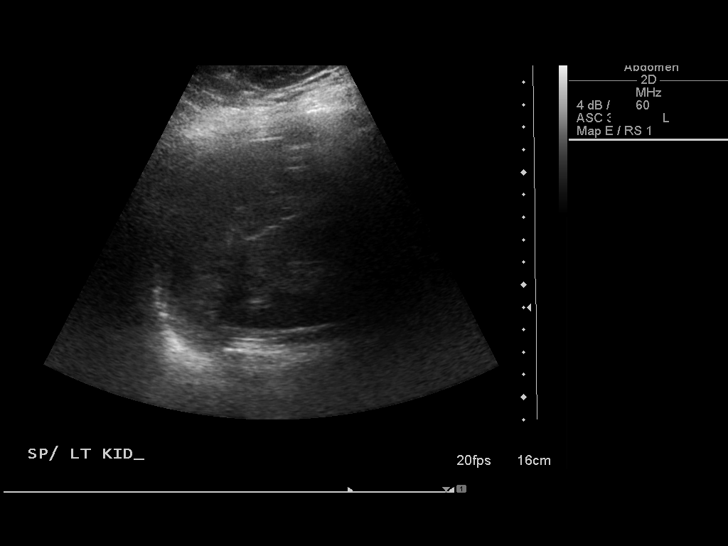

[14 of 25 positions shown; findings below may reference images not displayed]

FINDINGS: Gallbladder:  No gallstones, gallbladder wall thickening, or
pericholecystic fluid.

Common Bile Duct:  Within normal limits in caliber.

Liver: No focal mass lesion identified.  Within normal limits in
parenchymal echogenicity.

IVC:  Obscured by bowel gas

Pancreas:  Obscured by bowel gas

Spleen:  Within normal limits in size and echotexture.

Right kidney:  Normal in size and parenchymal echogenicity.  No
evidence of mass or hydronephrosis.

Left kidney:  Normal in size and parenchymal echogenicity.  No
evidence of mass or hydronephrosis.

Abdominal Aorta:  Proximal and mid abdominal aorta are
nonaneurysmal.  Distal aorta obscured by bowel gas.
IMPRESSION: Negative abdominal ultrasound.

## 2012-06-15 IMAGING — CT CT CHEST W/ CM
2 of 5 series · 16 of 46 positions shown, 18 images · IV contrast (agent unspecified)
Comparison: 10/08/2010

CT CHEST

CLINICAL DATA: Gastric fundal adenocarcinoma.

CT CHEST, ABDOMEN AND PELVIS WITH CONTRAST
TECHNIQUE: Multidetector CT imaging of the chest, abdomen and
pelvis was performed following the standard protocol during bolus
administration of intravenous contrast.
Contrast: 125 ml Amnipaque-JOO

[Series 2: cap with st · axial · 0.79mm/px · z∈[-606,-82]mm · 13 of 120 slices shown, 15 images]
[im 8/120  soft-tissue]
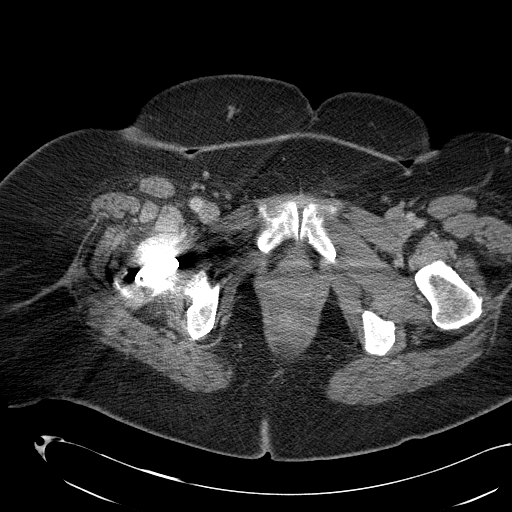
[im 8/120  bone]
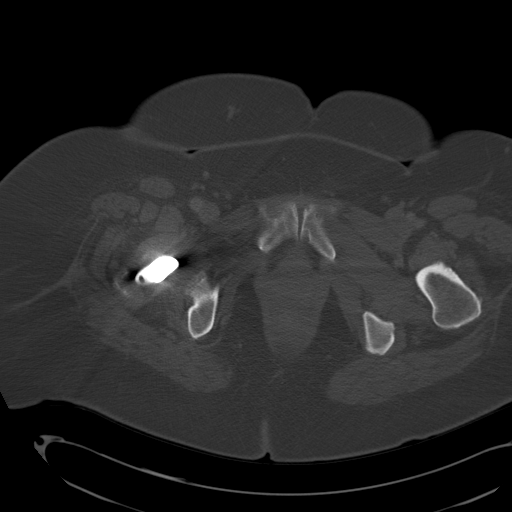
[im 15/120  soft-tissue]
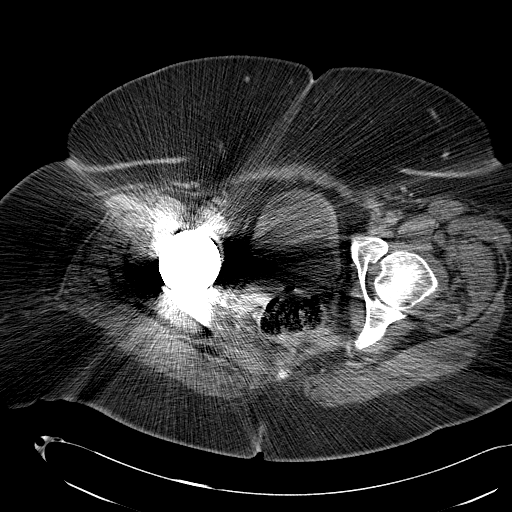
[im 29/120  soft-tissue]
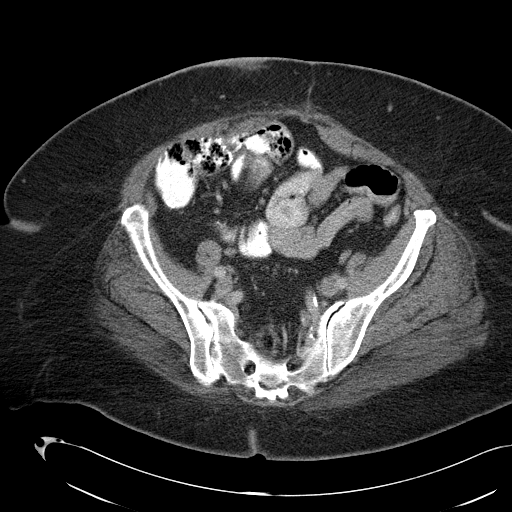
[im 36/120  soft-tissue]
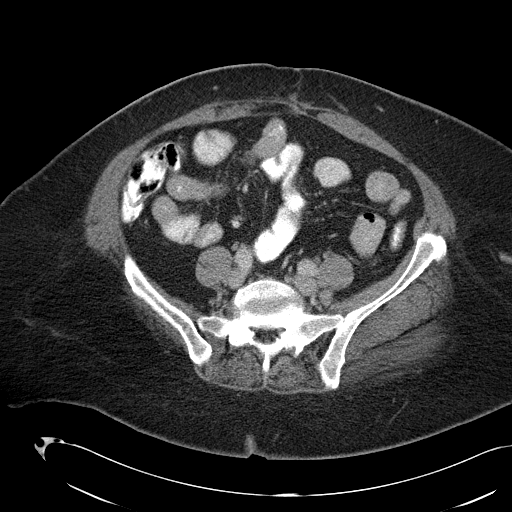
[im 43/120  soft-tissue]
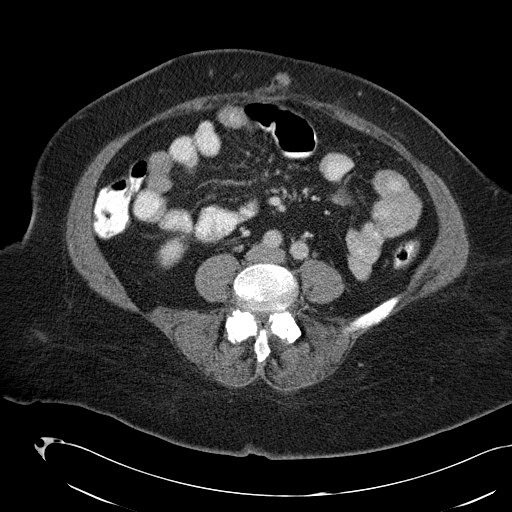
[im 50/120  soft-tissue]
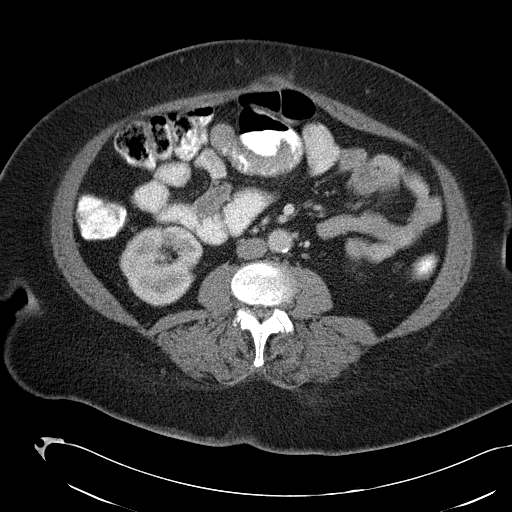
[im 64/120  soft-tissue]
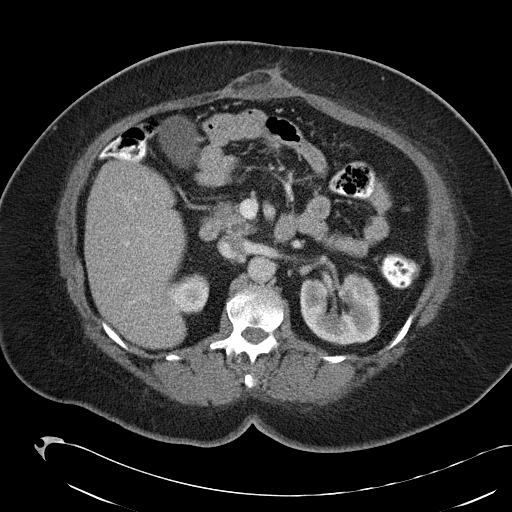
[im 71/120  soft-tissue]
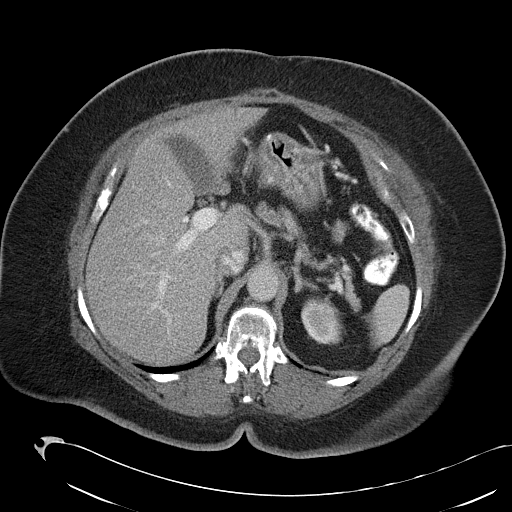
[im 78/120  soft-tissue]
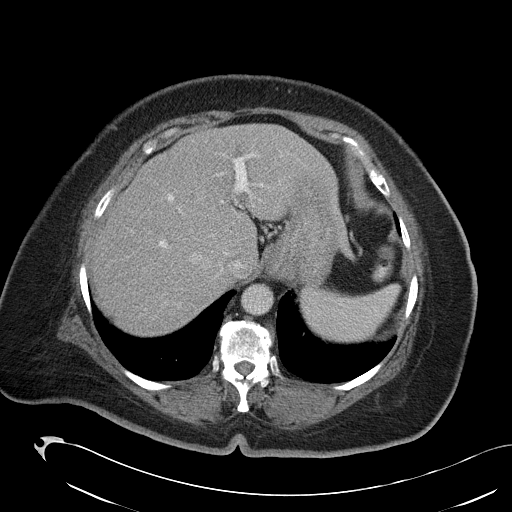
[im 78/120  bone]
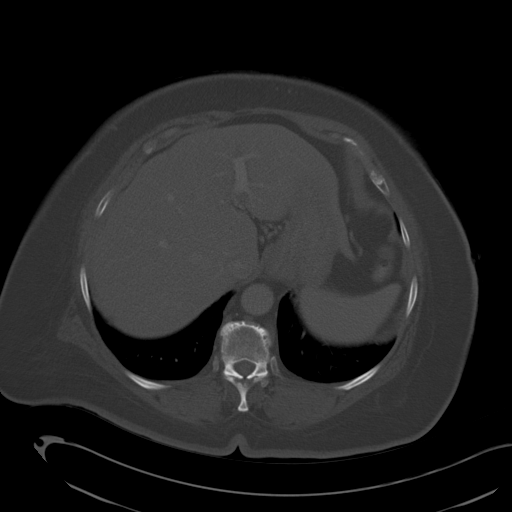
[im 85/120  soft-tissue]
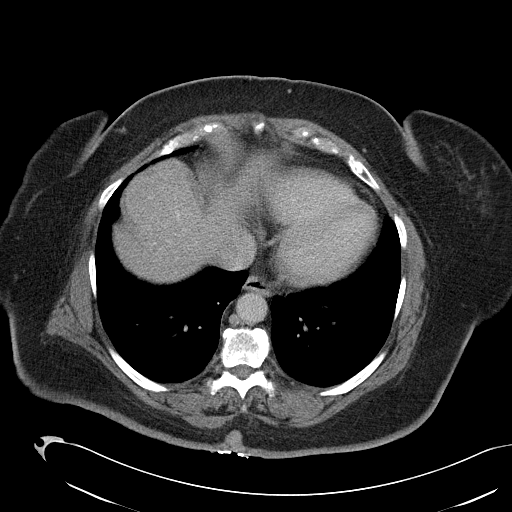
[im 92/120  soft-tissue]
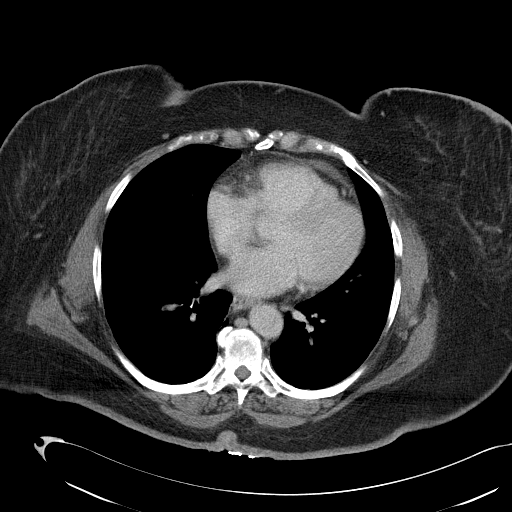
[im 106/120  soft-tissue]
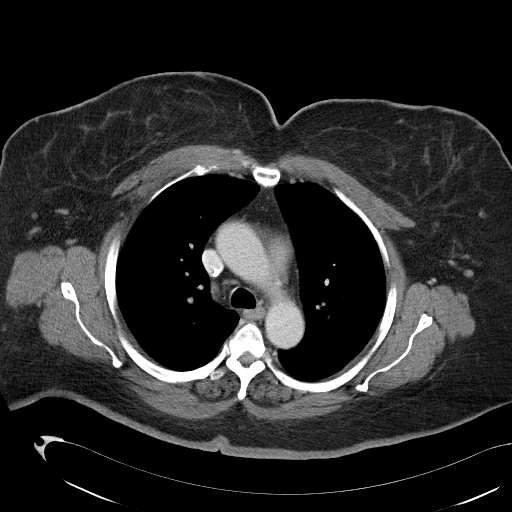
[im 113/120  soft-tissue]
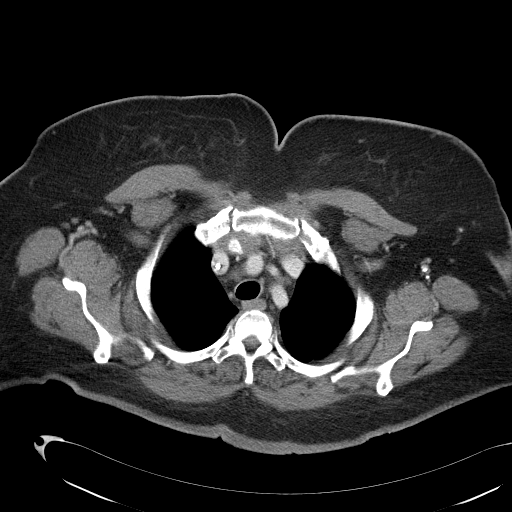

[Series 602: <mpr thick range> · coronal · 1.17mm/px · 3 of 116 slices shown]
[im 39/116  soft-tissue]
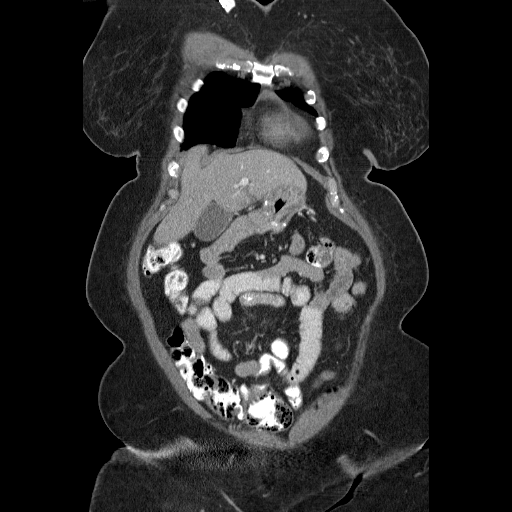
[im 52/116  soft-tissue]
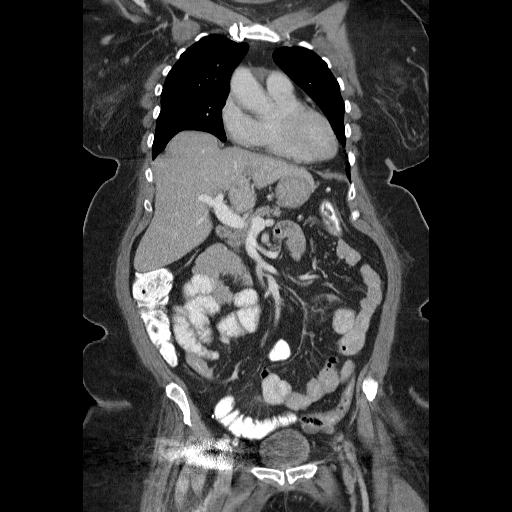
[im 64/116  soft-tissue]
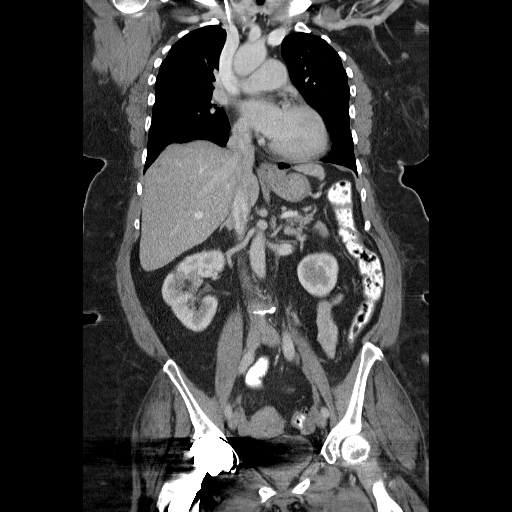

[16 of 46 positions shown; findings below may reference images not displayed]

FINDINGS: Stable multinodular appearance of the inferior thyroid
gland noted, with mild extension the left lobe into the superior
mediastinum.

Stable small axillary lymph nodes are present, the largest
measuring 1.1 cm in short axis on image 11 of series 2.

There is a small amount of anterior pericardial fluid.

No pathologic mediastinal or hilar adenopathy noted.

A Port-A-Cath terminates in the superior vena cava.

No lung mass identified.

The pulmonary artery measures 3.6 cm transverse, mildly prominent,
similar to prior.
IMPRESSION: 1.  Stable multinodular goiter.
2.  The main pulmonary artery is mildly prominent, which correlates
with pulmonary arterial hypertension.
3.  Trace pericardial effusion anteriorly.

CT ABDOMEN AND PELVIS
FINDINGS: The two tiny lateral segment left hepatic lobe lesions
on image 40 of series 2 appear stable and benign, each measuring
approximately 4 mm in diameter.

On images 45-47 of series 2, we demonstrate a low density band
extending along the posterior aspect of the lateral segment left
hepatic lobe and potentially into the medial segment left hepatic
lobe.  Vessels appear to be within this geographic small region of
low density, and given the location near the falciform ligament I
strongly favor this being a focal fatty infiltration.  No specific
lesions worrisome for metastatic malignancy are identified.

The spleen, adrenal glands, and pancreas appear unremarkable.
Stable postoperative findings in the stomach noted with patent
gastrojejunostomy

The gallbladder and biliary system appear unremarkable.

The kidneys appear unremarkable.

No pathologic retroperitoneal or porta hepatis adenopathy is
identified.

Stable postoperative appearance along the anterior abdominal wall
midline noted.

A right hip prosthesis is present.  There is a suggestion of a tiny
amount of fluid between the uterine fundus and the adjacent bowel
on image 79 of series 603.

Lower lumbar facet arthropathy noted.

Sigmoid diverticulosis noted without active diverticulitis
identified.
IMPRESSION: 1.  No compelling evidence of metastatic disease.  Geographic
infiltrative hypodensity along the falciform ligament is highly
likely to represent focal fatty infiltration.  I do recommend
attention to this region on follow-up imaging.  Although not felt
to be mandatory given the high likelihood of this representing a
focal fatty infiltration, this could be further worked up and
confirmed to represent fatty infiltration using MRI abdomen with
and without contrast.
2.  Sigmoid diverticulosis without active diverticulitis.
3.  There is a trace amount of free fluid above the uterine fundus.

## 2012-06-29 ENCOUNTER — Other Ambulatory Visit (HOSPITAL_BASED_OUTPATIENT_CLINIC_OR_DEPARTMENT_OTHER): Payer: Medicare Other | Admitting: Lab

## 2012-06-29 ENCOUNTER — Ambulatory Visit: Payer: Medicare Other

## 2012-06-29 ENCOUNTER — Ambulatory Visit (HOSPITAL_BASED_OUTPATIENT_CLINIC_OR_DEPARTMENT_OTHER): Payer: Medicare Other | Admitting: Hematology & Oncology

## 2012-06-29 VITALS — BP 121/85 | HR 92 | Temp 98.7°F | Resp 20 | Wt 278.0 lb

## 2012-06-29 DIAGNOSIS — M810 Age-related osteoporosis without current pathological fracture: Secondary | ICD-10-CM

## 2012-06-29 DIAGNOSIS — C165 Malignant neoplasm of lesser curvature of stomach, unspecified: Secondary | ICD-10-CM

## 2012-06-29 DIAGNOSIS — D649 Anemia, unspecified: Secondary | ICD-10-CM

## 2012-06-29 DIAGNOSIS — C169 Malignant neoplasm of stomach, unspecified: Secondary | ICD-10-CM

## 2012-06-29 DIAGNOSIS — R109 Unspecified abdominal pain: Secondary | ICD-10-CM

## 2012-06-29 DIAGNOSIS — E13610 Other specified diabetes mellitus with diabetic neuropathic arthropathy: Secondary | ICD-10-CM

## 2012-06-29 DIAGNOSIS — G589 Mononeuropathy, unspecified: Secondary | ICD-10-CM

## 2012-06-29 DIAGNOSIS — E119 Type 2 diabetes mellitus without complications: Secondary | ICD-10-CM

## 2012-06-29 LAB — CBC WITH DIFFERENTIAL (CANCER CENTER ONLY)
BASO%: 0.3 % (ref 0.0–2.0)
LYMPH%: 35.3 % (ref 14.0–48.0)
MCV: 81 fL (ref 81–101)
MONO#: 0.7 10*3/uL (ref 0.1–0.9)
MONO%: 10.5 % (ref 0.0–13.0)
NEUT#: 3.3 10*3/uL (ref 1.5–6.5)
Platelets: 366 10*3/uL (ref 145–400)
RBC: 4.29 10*6/uL (ref 3.70–5.32)
RDW: 15 % (ref 11.1–15.7)
WBC: 6.7 10*3/uL (ref 3.9–10.0)

## 2012-06-29 LAB — LIPID PANEL: LDL Cholesterol: 104 mg/dL — ABNORMAL HIGH (ref 0–99)

## 2012-06-29 LAB — HEMOGLOBIN A1C
Hgb A1c MFr Bld: 8.5 % — ABNORMAL HIGH (ref <5.7)
Mean Plasma Glucose: 197 mg/dL — ABNORMAL HIGH (ref <117)

## 2012-06-29 MED ORDER — HEPARIN SOD (PORK) LOCK FLUSH 100 UNIT/ML IV SOLN
500.0000 [IU] | Freq: Once | INTRAVENOUS | Status: AC
  Administered 2012-06-29: 500 [IU] via INTRAVENOUS
  Filled 2012-06-29: qty 5

## 2012-06-29 MED ORDER — DULOXETINE HCL 30 MG PO CPEP: 30.0000 mg | ORAL_CAPSULE | Freq: Every day | ORAL | Status: DC

## 2012-06-29 MED ORDER — ALTEPLASE 2 MG IJ SOLR
2.0000 mg | Freq: Once | INTRAMUSCULAR | Status: DC | PRN
  Filled 2012-06-29: qty 2

## 2012-06-29 MED ORDER — SODIUM CHLORIDE 0.9 % IJ SOLN
10.0000 mL | INTRAMUSCULAR | Status: DC | PRN
  Administered 2012-06-29: 10 mL via INTRAVENOUS
  Filled 2012-06-29: qty 10

## 2012-06-29 NOTE — Progress Notes (Signed)
This office note has been dictated.

## 2012-06-29 NOTE — Patient Instructions (Signed)

## 2012-06-30 LAB — COMPREHENSIVE METABOLIC PANEL
ALT: 10 U/L (ref 0–35)
Albumin: 4 g/dL (ref 3.5–5.2)
Alkaline Phosphatase: 75 U/L (ref 39–117)
CO2: 26 mEq/L (ref 19–32)
Potassium: 4.8 mEq/L (ref 3.5–5.3)
Sodium: 139 mEq/L (ref 135–145)
Total Bilirubin: 0.3 mg/dL (ref 0.3–1.2)
Total Protein: 6.9 g/dL (ref 6.0–8.3)

## 2012-06-30 LAB — IRON AND TIBC
Iron: 48 ug/dL (ref 42–145)
TIBC: 389 ug/dL (ref 250–470)
UIBC: 341 ug/dL (ref 125–400)

## 2012-06-30 NOTE — Progress Notes (Signed)
CC:   Ardyth Gal. Spruill, M.D. [name], M.D.  DIAGNOSES: 1. 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 2. Diabetes, marginal control.  CURRENT THERAPY:  Observation.  INTERIM HISTORY:  [name] comes in for followup.  She is complaining of abdominal pain.  This has been going on for a month.  She says that she has occasional spasms.  She has had some nausea.  She said this past Tuesday she had some emesis.  Her last scans were done back in May.  The scans did not show any evidence of recurrent disease.  She had postsurgical changes down in the abdomen.  Again, her blood sugars are not all that well-controlled.  She is not on insulin anymore.  She has neuropathy which appears to be getting worse. She is not tolerant of Lyrica or Neurontin.  I guess she could try Cymbalta.  She is tired.  Again, I think a lot of this is all from her other health issues.  PHYSICAL EXAMINATION:  General:  This is a well-developed, well- nourished black female in no obvious distress.  Vital signs:  Show a temperature of 98.7, pulse 92, respiratory rate 20, blood pressure 121/85.  Weight is 278.  Head and neck:  Shows a normocephalic, atraumatic skull.  There are no ocular or oral lesions.  There are no palpable cervical or supraclavicular lymph nodes.  Lungs:  Clear bilaterally.  Cardiac:  Regular rate and rhythm with a normal S1, S2. There are no murmurs, rubs or bruits.  Abdomen:  Soft with good bowel sounds.  She has well-healed laparotomy scar.  There is some tenderness throughout the abdomen to palpation.  Bowel sounds are active.  There is no obvious abdominal mass.  There is no palpable hepatosplenomegaly. Extremities:  Show some trace edema in her legs.  Back:  No tenderness over the spine, ribs or hips.  Neurological:  Shows no focal neurological deficits.  LABORATORY STUDIES:  White cell count is 6.7, hemoglobin 11.7, hematocrit 34.7, platelet  count 366.  IMPRESSION:  [name] is a 61 year old black female with a history of stage I adenocarcinoma of the stomach.  She is now 3 years out from completion of her adjuvant chemotherapy and radiation therapy.  I am not sure what this abdominal pain is all about.  I would have a hard time believing that this is anything related to cancer.  Because of financial issues she cannot be seen by any of the gastroenterologists as she does not have money to pay for any procedure. I wanted her to have endoscopies but she has not been able to have these.  We will see what the CT scans show.  I do not have any other ideas as to how to help her out with this abdominal issue.  We will give her Cymbalta for the neuropathy.  I told her that she really needs to take care of this with her family doctor who is managing her diabetes.  We will flush her Port-A-Cath today.  We will get her back in 6 weeks to see how she is doing.  I am sending her iron studies off today because she is feeling tired.    ______________________________ Volanda Napoleon, M.D. PRE/MEDQ  D:  06/29/2012  T:  06/30/2012  Job:  [id number]

## 2012-07-02 ENCOUNTER — Ambulatory Visit (HOSPITAL_BASED_OUTPATIENT_CLINIC_OR_DEPARTMENT_OTHER)
Admission: RE | Admit: 2012-07-02 | Discharge: 2012-07-02 | Disposition: A | Discharge status: Home / Self Care | Payer: Medicare Other | Source: Ambulatory Visit | Attending: Hematology & Oncology | Admitting: Hematology & Oncology

## 2012-07-02 DIAGNOSIS — C169 Malignant neoplasm of stomach, unspecified: Secondary | ICD-10-CM | POA: Insufficient documentation

## 2012-07-02 MED ORDER — IOHEXOL 300 MG/ML  SOLN
100.0000 mL | Freq: Once | INTRAMUSCULAR | Status: AC | PRN
  Administered 2012-07-02: 100 mL via INTRAVENOUS

## 2012-07-05 ENCOUNTER — Telehealth: Payer: Self-pay | Admitting: *Deleted

## 2012-07-05 ENCOUNTER — Other Ambulatory Visit: Payer: Self-pay | Admitting: *Deleted

## 2012-07-05 DIAGNOSIS — E611 Iron deficiency: Secondary | ICD-10-CM

## 2012-07-05 NOTE — Telephone Encounter (Signed)
Called patient to let her know that her iron levels were low and needs one dose of Feraheme 1020 mg.  Appt made with scheduler.  Also told patient that her blood sugar needs more control.  Encouraged patient to Call Dr. Wallene Huh and make an appt to discuss getting her blood sugar down.  Also informed patient that she needs hemoccult cards upon her next iron visit.

## 2012-07-05 NOTE — Telephone Encounter (Signed)
Message copied by Rico Ala on Thu Jul 05, 2012 11:47 AM ------      Message from: Volanda Napoleon      Created: Fri Jun 29, 2012  4:56 PM       Fax these to Dr Wallene Huh!!!  [facility] needs much better sugar control!!!  Laurey Arrow

## 2012-07-13 ENCOUNTER — Ambulatory Visit (HOSPITAL_BASED_OUTPATIENT_CLINIC_OR_DEPARTMENT_OTHER): Payer: Medicare Other

## 2012-07-13 VITALS — BP 100/67 | HR 83 | Temp 98.2°F | Resp 20

## 2012-07-13 DIAGNOSIS — E611 Iron deficiency: Secondary | ICD-10-CM

## 2012-07-13 DIAGNOSIS — D509 Iron deficiency anemia, unspecified: Secondary | ICD-10-CM

## 2012-07-13 DIAGNOSIS — C169 Malignant neoplasm of stomach, unspecified: Secondary | ICD-10-CM

## 2012-07-13 MED ORDER — HEPARIN SOD (PORK) LOCK FLUSH 100 UNIT/ML IV SOLN
500.0000 [IU] | Freq: Once | INTRAVENOUS | Status: AC | PRN
  Administered 2012-07-13: 500 [IU] via INTRAVENOUS
  Filled 2012-07-13: qty 5

## 2012-07-13 MED ORDER — SODIUM CHLORIDE 0.9 % IV SOLN
Freq: Once | INTRAVENOUS | Status: AC
  Administered 2012-07-13: 09:00:00 via INTRAVENOUS

## 2012-07-13 MED ORDER — SODIUM CHLORIDE 0.9 % IJ SOLN
10.0000 mL | INTRAMUSCULAR | Status: DC | PRN
  Administered 2012-07-13: 10 mL via INTRAVENOUS
  Filled 2012-07-13: qty 10

## 2012-07-13 MED ORDER — SODIUM CHLORIDE 0.9 % IV SOLN
1020.0000 mg | Freq: Once | INTRAVENOUS | Status: AC
  Administered 2012-07-13: 1020 mg via INTRAVENOUS
  Filled 2012-07-13: qty 34

## 2012-07-13 NOTE — Patient Instructions (Signed)
Ferumoxytol injection What is this medicine? FERUMOXYTOL is an iron complex. Iron is used to make healthy red blood cells, which carry oxygen and nutrients throughout the body. This medicine is used to treat iron deficiency anemia in people with chronic kidney disease. This medicine may be used for other purposes; ask your health care provider or pharmacist if you have questions. What should I tell my health care provider before I take this medicine? They need to know if you have any of these conditions: -anemia not caused by low iron levels -high levels of iron in the blood -magnetic resonance imaging (MRI) test scheduled -an unusual or allergic reaction to iron, other medicines, foods, dyes, or preservatives -pregnant or trying to get pregnant -breast-feeding How should I use this medicine? This medicine is for infusion into a vein. It is given by a health care professional in a hospital or clinic setting. Talk to your pediatrician regarding the use of this medicine in children. Special care may be needed. Overdosage: If you think you've taken too much of this medicine contact a poison control center or emergency room at once. Overdosage: If you think you have taken too much of this medicine contact a poison control center or emergency room at once. NOTE: This medicine is only for you. Do not share this medicine with others. What if I miss a dose? It is important not to miss your dose. Call your doctor or health care professional if you are unable to keep an appointment. What may interact with this medicine? This medicine may interact with the following medications: -other iron products This list may not describe all possible interactions. Give your health care provider a list of all the medicines, herbs, non-prescription drugs, or dietary supplements you use. Also tell them if you smoke, drink alcohol, or use illegal drugs. Some items may interact with your medicine. What should I watch  for while using this medicine? Visit your doctor or healthcare professional regularly. Tell your doctor or healthcare professional if your symptoms do not start to get better or if they get worse. You may need blood work done while you are taking this medicine. You may need to follow a special diet. Talk to your doctor. Foods that contain iron include: whole grains/cereals, dried fruits, beans, or peas, leafy green vegetables, and organ meats (liver, kidney). What side effects may I notice from receiving this medicine? Side effects that you should report to your doctor or health care professional as soon as possible: -allergic reactions like skin rash, itching or hives, swelling of the face, lips, or tongue -breathing problems -changes in blood pressure -feeling faint or lightheaded, falls -fever or chills -flushing, sweating, or hot feelings -swelling of the ankles or feet Side effects that usually do not require medical attention (Report these to your doctor or health care professional if they continue or are bothersome.): -diarrhea -headache -nausea, vomiting -stomach pain This list may not describe all possible side effects. Call your doctor for medical advice about side effects. You may report side effects to FDA at 1-800-FDA-1088. Where should I keep my medicine? This drug is given in a hospital or clinic and will not be stored at home. NOTE: This sheet is a summary. It may not cover all possible information. If you have questions about this medicine, talk to your doctor, pharmacist, or health care provider.  2012, [employer]. (04/02/2008 9:48:25 PM) 

## 2012-07-25 LAB — HM COLONOSCOPY

## 2012-08-10 ENCOUNTER — Ambulatory Visit: Payer: Medicare Other | Admitting: Hematology & Oncology

## 2012-08-10 ENCOUNTER — Other Ambulatory Visit: Payer: Medicare Other | Admitting: Lab

## 2012-08-17 ENCOUNTER — Encounter: Payer: Medicare Other | Admitting: Hematology & Oncology

## 2012-08-17 ENCOUNTER — Other Ambulatory Visit: Payer: Medicare Other | Admitting: Lab

## 2012-08-31 ENCOUNTER — Ambulatory Visit: Payer: Medicare Other

## 2012-08-31 ENCOUNTER — Other Ambulatory Visit (HOSPITAL_BASED_OUTPATIENT_CLINIC_OR_DEPARTMENT_OTHER): Payer: Medicare Other | Admitting: Lab

## 2012-08-31 ENCOUNTER — Ambulatory Visit (HOSPITAL_BASED_OUTPATIENT_CLINIC_OR_DEPARTMENT_OTHER): Payer: Medicare Other | Admitting: Hematology & Oncology

## 2012-08-31 VITALS — BP 114/63 | HR 76 | Temp 97.9°F | Resp 16 | Ht 67.0 in | Wt 279.0 lb

## 2012-08-31 DIAGNOSIS — C169 Malignant neoplasm of stomach, unspecified: Secondary | ICD-10-CM

## 2012-08-31 DIAGNOSIS — E1149 Type 2 diabetes mellitus with other diabetic neurological complication: Secondary | ICD-10-CM

## 2012-08-31 DIAGNOSIS — C165 Malignant neoplasm of lesser curvature of stomach, unspecified: Secondary | ICD-10-CM

## 2012-08-31 DIAGNOSIS — D509 Iron deficiency anemia, unspecified: Secondary | ICD-10-CM

## 2012-08-31 DIAGNOSIS — F32A Depression, unspecified: Secondary | ICD-10-CM

## 2012-08-31 DIAGNOSIS — E1142 Type 2 diabetes mellitus with diabetic polyneuropathy: Secondary | ICD-10-CM

## 2012-08-31 DIAGNOSIS — F329 Major depressive disorder, single episode, unspecified: Secondary | ICD-10-CM

## 2012-08-31 LAB — CBC WITH DIFFERENTIAL (CANCER CENTER ONLY)
BASO#: 0 10*3/uL (ref 0.0–0.2)
BASO%: 0.3 % (ref 0.0–2.0)
EOS%: 7 % (ref 0.0–7.0)
LYMPH#: 1.9 10*3/uL (ref 0.9–3.3)
MCH: 28.4 pg (ref 26.0–34.0)
MCHC: 33.3 g/dL (ref 32.0–36.0)
MONO%: 8.6 % (ref 0.0–13.0)
NEUT#: 3.7 10*3/uL (ref 1.5–6.5)
Platelets: 319 10*3/uL (ref 145–400)
RDW: 16.6 % — ABNORMAL HIGH (ref 11.1–15.7)

## 2012-08-31 LAB — COMPREHENSIVE METABOLIC PANEL
ALT: 11 U/L (ref 0–35)
AST: 13 U/L (ref 0–37)
Albumin: 4 g/dL (ref 3.5–5.2)
Alkaline Phosphatase: 74 U/L (ref 39–117)
BUN: 12 mg/dL (ref 6–23)
Chloride: 106 mEq/L (ref 96–112)
Potassium: 4.9 mEq/L (ref 3.5–5.3)
Sodium: 140 mEq/L (ref 135–145)

## 2012-08-31 LAB — IRON AND TIBC: TIBC: 291 ug/dL (ref 250–470)

## 2012-08-31 MED ORDER — FLUOXETINE HCL 20 MG PO CAPS: 20.0000 mg | ORAL_CAPSULE | Freq: Every day | ORAL | Status: DC

## 2012-08-31 MED ORDER — HEPARIN SOD (PORK) LOCK FLUSH 100 UNIT/ML IV SOLN
500.0000 [IU] | Freq: Once | INTRAVENOUS | Status: AC | PRN
  Administered 2012-08-31: 500 [IU] via INTRAVENOUS
  Filled 2012-08-31: qty 5

## 2012-08-31 MED ORDER — SODIUM CHLORIDE 0.9 % IJ SOLN
10.0000 mL | INTRAMUSCULAR | Status: DC | PRN
  Administered 2012-08-31: 10 mL via INTRAVENOUS
  Filled 2012-08-31: qty 10

## 2012-08-31 MED ORDER — ALTEPLASE 2 MG IJ SOLR
2.0000 mg | Freq: Once | INTRAMUSCULAR | Status: DC | PRN
  Filled 2012-08-31: qty 2

## 2012-08-31 NOTE — Patient Instructions (Signed)

## 2012-08-31 NOTE — Progress Notes (Signed)
This office note has been dictated.

## 2012-09-01 NOTE — Progress Notes (Signed)
CC:   [name], M.D.  DIAGNOSES: 1. 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 2. Insulin-dependent diabetes.  CURRENT THERAPY:  Observation.  INTERIM HISTORY:  [name] comes in for followup.  She seems to be doing pretty well.  She is not complaining of any abdominal pain.  She is having no cough or shortness of breath.  She states she has a little bit of a headache. She was on Prozac and has not taken this for a while. She has had this refilled.  She has had no change in bowel or bladder habits.  She says that her diabetes has been doing quite well.  She says her blood sugars usually are around 120.  She has had no rashes.  She did get iron, I think, back in December. She had a little bit of a rash to the iron infusion.  She does __________ diabetic neuropathy.  She cannot tolerate Lyrica, Cymbalta, or Neurontin.  PHYSICAL EXAMINATION:  This is an obese black female in no obvious distress.  Vital signs:  Temperature 97.9, pulse 76, respiratory rate 18, blood pressure 114/63.  Weight is 279.  Head/neck:  Normocephalic, atraumatic skull.  There are no ocular or oral lesions.  There are no palpable cervical or supraclavicular lymph nodes.  Lungs:  Clear bilaterally.  Cardiac:  Regular rate and rhythm with a normal S1 and S2. There are no murmurs, rubs or bruits.  Abdomen:  Soft with good bowel sounds.  There is no palpable abdominal mass.  There is no fluid wave. There is no palpable hepatosplenomegaly.  She has a well-healed laparotomy scar.  Extremities:  No clubbing, cyanosis or edema.  Skin: No rashes, ecchymosis or petechia.  LABORATORY STUDIES:  White cell count 6.7, hemoglobin 12.1, hematocrit 36.3, platelet count 319.  IMPRESSION:  [name] is a 62 year old African American female with a history of stage IB adenocarcinoma of the stomach.  She underwent resection followed by adjuvant chemo and radiation therapy.  Everything is  looking real good from my point of view with this.  We have done scans on her.  They were done back in December.  All the scans looked fine.  We will go ahead and plan to get her back in another 3 months now.  We have tried to get her to a gastroenterologist for an upper endoscopy because of past abdominal pain.  She has not been able to go see one due to financial issues.  We will continue to flush her Port-A-Cath.    ______________________________ Volanda Napoleon, M.D. PRE/MEDQ  D:  08/31/2012  T:  09/01/2012  Job:  [id number]

## 2012-09-03 ENCOUNTER — Telehealth: Payer: Self-pay | Admitting: Hematology & Oncology

## 2012-09-03 NOTE — Telephone Encounter (Addendum)
Message copied by Trevor Mace on Mon Sep 03, 2012  3:49 PM ------      Message from: [place], [place]      Created: Mon Sep 03, 2012  2:10 PM                   ----- Message -----         From: Volanda Napoleon, MD         Sent: 09/02/2012   5:37 PM           To: Adella Nissen Nurse Hp            Call - iron is much better!!!  pete ------09-03-12  Called and spoke to patient regarding above MD message and her lab studies. Roxan Diesel LPN

## 2012-09-07 ENCOUNTER — Emergency Department (HOSPITAL_COMMUNITY)
Admission: EM | Admit: 2012-09-07 | Discharge: 2012-09-08 | Disposition: A | Discharge status: Home / Self Care | Payer: Medicare Other | Attending: Emergency Medicine | Admitting: Emergency Medicine

## 2012-09-07 ENCOUNTER — Encounter (HOSPITAL_COMMUNITY): Payer: Self-pay

## 2012-09-07 ENCOUNTER — Emergency Department (HOSPITAL_COMMUNITY): Payer: Medicare Other

## 2012-09-07 DIAGNOSIS — M25562 Pain in left knee: Secondary | ICD-10-CM

## 2012-09-07 DIAGNOSIS — Y9301 Activity, walking, marching and hiking: Secondary | ICD-10-CM | POA: Insufficient documentation

## 2012-09-07 DIAGNOSIS — W010XXA Fall on same level from slipping, tripping and stumbling without subsequent striking against object, initial encounter: Secondary | ICD-10-CM | POA: Insufficient documentation

## 2012-09-07 DIAGNOSIS — Z96649 Presence of unspecified artificial hip joint: Secondary | ICD-10-CM | POA: Insufficient documentation

## 2012-09-07 DIAGNOSIS — Z85028 Personal history of other malignant neoplasm of stomach: Secondary | ICD-10-CM | POA: Insufficient documentation

## 2012-09-07 DIAGNOSIS — Z79899 Other long term (current) drug therapy: Secondary | ICD-10-CM | POA: Insufficient documentation

## 2012-09-07 DIAGNOSIS — E119 Type 2 diabetes mellitus without complications: Secondary | ICD-10-CM | POA: Insufficient documentation

## 2012-09-07 DIAGNOSIS — S99929A Unspecified injury of unspecified foot, initial encounter: Secondary | ICD-10-CM | POA: Insufficient documentation

## 2012-09-07 DIAGNOSIS — J45909 Unspecified asthma, uncomplicated: Secondary | ICD-10-CM | POA: Insufficient documentation

## 2012-09-07 DIAGNOSIS — Y929 Unspecified place or not applicable: Secondary | ICD-10-CM | POA: Insufficient documentation

## 2012-09-07 DIAGNOSIS — Z96659 Presence of unspecified artificial knee joint: Secondary | ICD-10-CM | POA: Insufficient documentation

## 2012-09-07 DIAGNOSIS — S8990XA Unspecified injury of unspecified lower leg, initial encounter: Secondary | ICD-10-CM | POA: Insufficient documentation

## 2012-09-07 DIAGNOSIS — I10 Essential (primary) hypertension: Secondary | ICD-10-CM | POA: Insufficient documentation

## 2012-09-07 MED ORDER — OXYCODONE-ACETAMINOPHEN 5-325 MG PO TABS
1.0000 | ORAL_TABLET | Freq: Once | ORAL | Status: AC
  Administered 2012-09-07: 1 via ORAL
  Filled 2012-09-07: qty 1

## 2012-09-07 NOTE — ED Notes (Signed)
Per EMS, pt states leg gave way and she fell to left knee.  Knee replacement in hx for same leg.  States pain knee and unable to bear weight to leg.  Hx hip replacement, DM, Stomach CA, Vitals:  127/65, hr 78.

## 2012-09-07 NOTE — ED Notes (Signed)
HUD:[id number]> Expected date:<BR> Expected time:<BR> Means of arrival:<BR> Comments:<BR> Medic, 61 F, Knee Pain

## 2012-09-08 ENCOUNTER — Other Ambulatory Visit: Payer: Self-pay

## 2012-09-08 MED ORDER — HYDROCODONE-ACETAMINOPHEN 5-325 MG PO TABS: 1.0000 | ORAL_TABLET | ORAL | Status: DC | PRN

## 2012-09-08 NOTE — ED Provider Notes (Signed)
History     CSN: [id number]  Arrival date & time 09/07/12  2249   First MD Initiated Contact with Patient 09/07/12 2258      Chief Complaint  Patient presents with  . Knee Pain  . Fall    The history is provided by the patient.   the patient reports she was walking up steps when she tripped in her left knee "gave out on her".  She struck her left knee on the anterior surface following forward onto her left knee.  She reports severe pain in left knee.  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  She denies numbness and tingling.  No other injuries noted.  Symptoms are mild to moderate in severity.  Her pain is worsened by movement and palpation.  Nothing improves her pain.  Past Medical History  Diagnosis Date  . stomach ca dx'd 2010    chemo/xrt comp 05/2009  . Hypertension   . Diabetes mellitus   . Asthma     History reviewed. No pertinent past surgical history.  History reviewed. No pertinent family history.  History  Substance Use Topics  . Smoking status: Not on file  . Smokeless tobacco: Not on file  . Alcohol Use: Not on file    OB History   Grav Para Term Preterm Abortions TAB SAB Ect Mult Living                  Review of Systems  All other systems reviewed and are negative.    Allergies  Codeine; Zocor; and Penicillins  Home Medications   Current Outpatient Rx  Name  Route  Sig  Dispense  Refill  . albuterol (PROVENTIL) (2.5 MG/3ML) 0.083% nebulizer solution   Mouth/Throat   Use as directed 2.5 mg in the mouth or throat every 6 (six) hours as needed.           . carvedilol (COREG) 12.5 MG tablet   Oral   Take 12.5 mg by mouth every morning.          Marland Kitchen esomeprazole (NEXIUM) 40 MG capsule   Oral   Take 1 capsule (40 mg total) by mouth 2 (two) times daily.   60 capsule   6   . FLUoxetine (PROZAC) 20 MG capsule   Oral   Take 1 capsule (20 mg total) by mouth daily.   30 capsule   6   . glimepiride (AMARYL) 4 MG tablet   Oral  Take 4 mg by mouth daily before breakfast.          . HYDROcodone-acetaminophen (NORCO/VICODIN) 5-325 MG per tablet   Oral   Take 1 tablet by mouth every 4 (four) hours as needed for pain.   8 tablet   0   . losartan (COZAAR) 50 MG tablet   Oral   Take 50 mg by mouth daily.         . montelukast (SINGULAIR) 10 MG tablet   Oral   Take 10 mg by mouth at bedtime.           . Omega-3 Fatty Acids (FISH OIL) 1000 MG CAPS   Oral   Take by mouth every morning.         . sitaGLIPtan-metformin (JANUMET) 50-1000 MG per tablet   Oral   Take 1 tablet by mouth 1 dose over 24 hours.           . traMADol (ULTRAM) 50 MG tablet   Oral  Take 50 mg by mouth every 8 (eight) hours as needed.         . triamterene-hydrochlorothiazide (DYAZIDE) 37.5-25 MG per capsule   Oral   Take 1 each (1 capsule total) by mouth daily.   30 capsule   1     Please send further refills to her PCP Dr. Awanda Mink  ...     BP 111/58  Pulse 79  Temp(Src) 98.3 F (36.8 C) (Oral)  SpO2 99%  Physical Exam  Nursing note and vitals reviewed. Constitutional: She is oriented to person, place, and time. She appears well-developed and well-nourished. No distress.  HENT:  Head: Normocephalic and atraumatic.  Eyes: EOM are normal.  Neck: Normal range of motion.  Cardiovascular: Normal rate, regular rhythm and normal heart sounds.   Pulmonary/Chest: Effort normal and breath sounds normal.  Abdominal: Soft. She exhibits no distension. There is no tenderness.  Musculoskeletal: Normal range of motion.  Full range of motion of left knee.  No significant left knee effusion.  Mild tenderness along the medial lateral joint lines of left knee.  No obvious bruising.  Normal pulses distally.  Compartment soft.  Neurological: She is alert and oriented to person, place, and time.  Skin: Skin is warm and dry.  Psychiatric: She has a normal mood and affect. Judgment normal.    ED Course  Procedures (including  critical care time)  Labs Reviewed - No data to display Dg Knee Complete 4 Views Left  09/08/2012  *RADIOLOGY REPORT*  Clinical Data: Status post fall, with left knee pain and swelling. Unable to bear weight on left knee.  LEFT KNEE - COMPLETE 4+ VIEW  Comparison: Left knee radiographs performed 09/09/2009  Findings: There is no evidence of fracture or dislocation.  The patient's left knee prosthesis is unremarkable in appearance, without evidence of loosening.  The patellar component is grossly unremarkable.  No significant joint effusion is seen; there appears to be chronic edema or scarring at Hoffa's fat pad.  The visualized soft tissues are otherwise unremarkable in appearance.  IMPRESSION:  1.  No evidence of fracture or dislocation. 2.  Left knee prosthesis appears intact, without evidence of loosening. 3.  Apparent chronic edema or scarring at Hoffa's fat pad.   Original Report Authenticated By: Santa Lighter, M.D.    I personally reviewed the imaging tests through PACS system I reviewed available ER/hospitalization records through the EMR    1. Left knee pain       MDM  Mechanical fall.  X-ray negative.  Home with pain medication and orthopedic followup.  Compartment soft.        Hoy Morn, MD 09/08/12 [phone]

## 2012-10-11 ENCOUNTER — Emergency Department (HOSPITAL_COMMUNITY): Payer: Medicare Other

## 2012-10-11 ENCOUNTER — Encounter (HOSPITAL_COMMUNITY): Payer: Self-pay | Admitting: *Deleted

## 2012-10-11 ENCOUNTER — Emergency Department (HOSPITAL_COMMUNITY)
Admission: EM | Admit: 2012-10-11 | Discharge: 2012-10-11 | Disposition: A | Discharge status: Home / Self Care | Payer: Medicare Other | Attending: Emergency Medicine | Admitting: Emergency Medicine

## 2012-10-11 DIAGNOSIS — Z79899 Other long term (current) drug therapy: Secondary | ICD-10-CM | POA: Insufficient documentation

## 2012-10-11 DIAGNOSIS — E119 Type 2 diabetes mellitus without complications: Secondary | ICD-10-CM | POA: Insufficient documentation

## 2012-10-11 DIAGNOSIS — Z9089 Acquired absence of other organs: Secondary | ICD-10-CM | POA: Insufficient documentation

## 2012-10-11 DIAGNOSIS — J45909 Unspecified asthma, uncomplicated: Secondary | ICD-10-CM | POA: Insufficient documentation

## 2012-10-11 DIAGNOSIS — Z87891 Personal history of nicotine dependence: Secondary | ICD-10-CM | POA: Insufficient documentation

## 2012-10-11 DIAGNOSIS — Z9889 Other specified postprocedural states: Secondary | ICD-10-CM | POA: Insufficient documentation

## 2012-10-11 DIAGNOSIS — R509 Fever, unspecified: Secondary | ICD-10-CM | POA: Insufficient documentation

## 2012-10-11 DIAGNOSIS — R1084 Generalized abdominal pain: Secondary | ICD-10-CM | POA: Insufficient documentation

## 2012-10-11 DIAGNOSIS — K5289 Other specified noninfective gastroenteritis and colitis: Secondary | ICD-10-CM | POA: Insufficient documentation

## 2012-10-11 DIAGNOSIS — Z85028 Personal history of other malignant neoplasm of stomach: Secondary | ICD-10-CM | POA: Insufficient documentation

## 2012-10-11 DIAGNOSIS — M545 Low back pain, unspecified: Secondary | ICD-10-CM | POA: Insufficient documentation

## 2012-10-11 DIAGNOSIS — I10 Essential (primary) hypertension: Secondary | ICD-10-CM | POA: Insufficient documentation

## 2012-10-11 DIAGNOSIS — R112 Nausea with vomiting, unspecified: Secondary | ICD-10-CM | POA: Insufficient documentation

## 2012-10-11 DIAGNOSIS — K529 Noninfective gastroenteritis and colitis, unspecified: Secondary | ICD-10-CM

## 2012-10-11 LAB — URINALYSIS, ROUTINE W REFLEX MICROSCOPIC
Bilirubin Urine: NEGATIVE
Ketones, ur: NEGATIVE mg/dL
Nitrite: NEGATIVE
Protein, ur: NEGATIVE mg/dL
Urobilinogen, UA: 0.2 mg/dL (ref 0.0–1.0)

## 2012-10-11 LAB — COMPREHENSIVE METABOLIC PANEL
ALT: 12 U/L (ref 0–35)
Alkaline Phosphatase: 87 U/L (ref 39–117)
CO2: 24 mEq/L (ref 19–32)
GFR calc Af Amer: 50 mL/min — ABNORMAL LOW (ref >90)
GFR calc non Af Amer: 43 mL/min — ABNORMAL LOW (ref >90)
Glucose, Bld: 227 mg/dL — ABNORMAL HIGH (ref 70–99)
Potassium: 4.8 mEq/L (ref 3.5–5.1)
Sodium: 139 mEq/L (ref 135–145)

## 2012-10-11 LAB — CBC WITH DIFFERENTIAL/PLATELET
Eosinophils Relative: 1 % (ref 0–5)
Hemoglobin: 12.9 g/dL (ref 12.0–15.0)
Lymphocytes Relative: 22 % (ref 12–46)
Lymphs Abs: 2 10*3/uL (ref 0.7–4.0)
MCV: 84.6 fL (ref 78.0–100.0)
Monocytes Relative: 9 % (ref 3–12)
Neutrophils Relative %: 68 % (ref 43–77)
Platelets: 341 10*3/uL (ref 150–400)
RBC: 4.62 MIL/uL (ref 3.87–5.11)
WBC: 9 10*3/uL (ref 4.0–10.5)

## 2012-10-11 LAB — URINE MICROSCOPIC-ADD ON

## 2012-10-11 MED ORDER — ONDANSETRON HCL 4 MG/2ML IJ SOLN
4.0000 mg | Freq: Once | INTRAMUSCULAR | Status: AC
  Administered 2012-10-11: 4 mg via INTRAVENOUS
  Filled 2012-10-11: qty 2

## 2012-10-11 MED ORDER — ACETAMINOPHEN 325 MG PO TABS
650.0000 mg | ORAL_TABLET | Freq: Once | ORAL | Status: AC
  Administered 2012-10-11: 650 mg via ORAL
  Filled 2012-10-11: qty 2

## 2012-10-11 MED ORDER — SODIUM CHLORIDE 0.9 % IV BOLUS (SEPSIS)
1000.0000 mL | Freq: Once | INTRAVENOUS | Status: AC
  Administered 2012-10-11: 1000 mL via INTRAVENOUS

## 2012-10-11 MED ORDER — IBUPROFEN 200 MG PO TABS
600.0000 mg | ORAL_TABLET | Freq: Once | ORAL | Status: AC
  Administered 2012-10-11: 600 mg via ORAL
  Filled 2012-10-11: qty 3

## 2012-10-11 MED ORDER — ONDANSETRON HCL 4 MG PO TABS: 4.0000 mg | ORAL_TABLET | Freq: Four times a day (QID) | ORAL | Status: DC

## 2012-10-11 NOTE — Progress Notes (Signed)
Pt refused to have her port accessed stating it was attempted twice and she won't allow anyone else to attempt.  Pt also insisted that the IV be started in her right [facility], despite telling her the [facility] is not an optimal location for IV fluids, she refused to have it attempted any other place.  [employer]ina Pizza

## 2012-10-11 NOTE — ED Notes (Signed)
Attempted to assess porta cath, unable, 2nd RN called to try

## 2012-10-11 NOTE — ED Notes (Signed)
Pt from home with reports of N/V/D, fever and lower back pain that radiates to left side that started last night around 8pm. Pt reports being exposed to grandchildren whom have been sick with same symptoms. Pt reports last dose of Tylenol at 7:30 am.

## 2012-10-11 NOTE — ED Provider Notes (Signed)
History     CSN: [id number]  Arrival date & time 10/11/12  4098   First MD Initiated Contact with Patient 10/11/12 0957      Chief Complaint  Patient presents with  . Nausea  . Emesis  . Diarrhea  . Fever  . Back Pain    Lower    (Consider location/radiation/quality/duration/timing/severity/associated sxs/prior treatment) HPI Pt presents with acute onset last night of vomiting and diarrhea with diffuse abdominal pain.  No fever/chills but feels chilled upon arrival to the ED.  Emesis nonbloody and nonbilious.  No blood or melena in stool.  Has been around her grandchildren who have had a similar illness.  No recent travel.  She states she has not been able to keep down liquids.  Denies dysuria.  No lightheadedness or fainting.  There are no other associated systemic symptoms, there are no other alleviating or modifying factors.   Past Medical History  Diagnosis Date  . stomach ca dx'd 2010    chemo/xrt comp 05/2009  . Hypertension   . Diabetes mellitus   . Asthma     Past Surgical History  Procedure Laterality Date  . Colon surgery    . Total knee arthroplasty      Left  . Total hip arthroplasty      Right  . Shoulder surgery      Left  . Appendectomy    . Cesarean section    . Hernia repair      History reviewed. No pertinent family history.  History  Substance Use Topics  . Smoking status: Former Research scientist (life sciences)  . Smokeless tobacco: Never Used  . Alcohol Use: No    OB History   Grav Para Term Preterm Abortions TAB SAB Ect Mult Living                  Review of Systems ROS reviewed and all otherwise negative except for mentioned in HPI  Allergies  Codeine; Zocor; and Penicillins  Home Medications   Current Outpatient Rx  Name  Route  Sig  Dispense  Refill  . albuterol (PROVENTIL) (2.5 MG/3ML) 0.083% nebulizer solution   Mouth/Throat   Use as directed 2.5 mg in the mouth or throat every 6 (six) hours as needed. For shortness of breath         .  carvedilol (COREG) 12.5 MG tablet   Oral   Take 12.5 mg by mouth every morning.          Marland Kitchen esomeprazole (NEXIUM) 40 MG capsule   Oral   Take 1 capsule (40 mg total) by mouth 2 (two) times daily.   60 capsule   6   . FLUoxetine (PROZAC) 20 MG capsule   Oral   Take 1 capsule (20 mg total) by mouth daily.   30 capsule   6   . glimepiride (AMARYL) 4 MG tablet   Oral   Take 4 mg by mouth daily before breakfast.          . HYDROcodone-acetaminophen (NORCO/VICODIN) 5-325 MG per tablet   Oral   Take 1 tablet by mouth every 4 (four) hours as needed for pain.   8 tablet   0   . losartan (COZAAR) 50 MG tablet   Oral   Take 50 mg by mouth daily.         . montelukast (SINGULAIR) 10 MG tablet   Oral   Take 10 mg by mouth at bedtime.           Marland Kitchen  Omega-3 Fatty Acids (FISH OIL) 1000 MG CAPS   Oral   Take by mouth every morning.         . sitaGLIPtan-metformin (JANUMET) 50-1000 MG per tablet   Oral   Take 1 tablet by mouth 1 dose over 24 hours.           . traMADol (ULTRAM) 50 MG tablet   Oral   Take 50 mg by mouth every 8 (eight) hours as needed.         . triamterene-hydrochlorothiazide (DYAZIDE) 37.5-25 MG per capsule   Oral   Take 1 each (1 capsule total) by mouth daily.   30 capsule   1     Please send further refills to her PCP Dr. Awanda Mink  ...   . ondansetron (ZOFRAN) 4 MG tablet   Oral   Take 1 tablet (4 mg total) by mouth every 6 (six) hours.   12 tablet   0     BP 93/48  Pulse 95  Temp(Src) 97.8 F (36.6 C) (Oral)  Resp 18  SpO2 98% Vitals reviewed Physical Exam Physical Examination: General appearance - alert, well appearing, and in no distress Mental status - alert, oriented to person, place, and time Eyes - no conjunctival injection, no scleral icterus Mouth - mucous membranes moist, pharynx normal without lesions Chest - clear to auscultation, no wheezes, rales or rhonchi, symmetric air entry Heart - normal rate, regular rhythm,  normal S1, S2, no murmurs, rubs, clicks or gallops Abdomen - soft, nontender, nondistended, no masses or organomegaly, nabs Extremities - peripheral pulses normal, no pedal edema, no clubbing or cyanosis Skin - normal coloration and turgor, no rashes  ED Course  Procedures (including critical care time)  Labs Reviewed  CBC WITH DIFFERENTIAL - Abnormal; Notable for the following:    RDW 16.1 (*)    All other components within normal limits  COMPREHENSIVE METABOLIC PANEL - Abnormal; Notable for the following:    Glucose, Bld 227 (*)    Creatinine, Ser 1.32 (*)    GFR calc non Af Amer 43 (*)    GFR calc Af Amer 50 (*)    All other components within normal limits  URINALYSIS, ROUTINE W REFLEX MICROSCOPIC - Abnormal; Notable for the following:    APPearance CLOUDY (*)    Hgb urine dipstick LARGE (*)    All other components within normal limits  URINE MICROSCOPIC-ADD ON - Abnormal; Notable for the following:    Squamous Epithelial / LPF FEW (*)    Bacteria, UA FEW (*)    All other components within normal limits   Dg Abd Acute W/chest  10/11/2012  *RADIOLOGY REPORT*  Clinical Data: Nausea.  Vomiting.  Abdominal pain.  History of gastric cancer.  Asthma.  ACUTE ABDOMEN SERIES (ABDOMEN 2 VIEW & CHEST 1 VIEW)  Comparison: 07/02/2012 CT scan  Findings: Right power port catheter tip projects over the SVC. Mild cardiomegaly noted with cardiothoracic index 53%.  No free peroneal gas is present beneath the hemidiaphragms.  Thoracic spondylosis noted.  Several air-fluid levels are present in nondilated loops of small bowel.  Air-fluid levels noted in the left colon.  No dilated bowel noted.  Right hip arthroplasty noted.  IMPRESSION:  1.  Air-fluid levels in the descending colon may reflect diarrheal process. 2.  Air-fluid levels in nondilated loops of small bowel are probably incidental but could reflect enteritis. 3.  Thoracic spondylosis. 4.  Mild cardiomegaly.   Original Report Authenticated By:  Van Clines,  M.D.      1. Gastroenteritis       MDM  Pt presenting with c/o nausea, vomiting and diarrhea.  Labs and urine area reasuring.  Pt feels improved after fluids and zofran.  Has been able to tolerate po fluids.  HR imporved as well.  Suspect gastroenteritis- xrays are c/w this as well.  Low suspicion for SBO.  Discharged with strict return precautions.  Pt agreeable with plan.        Threasa Beards, MD 10/12/12 0730

## 2012-10-11 NOTE — ED Notes (Signed)
Patient transported to X-ray 

## 2012-10-12 ENCOUNTER — Emergency Department (HOSPITAL_COMMUNITY): Payer: Medicare Other

## 2012-10-12 ENCOUNTER — Encounter (HOSPITAL_COMMUNITY): Payer: Self-pay | Admitting: Emergency Medicine

## 2012-10-12 ENCOUNTER — Inpatient Hospital Stay (HOSPITAL_COMMUNITY)
Admission: EM | Admit: 2012-10-12 | Discharge: 2012-10-16 | DRG: 392 | Disposition: A | Discharge status: Home / Self Care | Payer: Medicare Other | Attending: Cardiology | Admitting: Cardiology

## 2012-10-12 DIAGNOSIS — Z903 Acquired absence of stomach [part of]: Secondary | ICD-10-CM

## 2012-10-12 DIAGNOSIS — I10 Essential (primary) hypertension: Secondary | ICD-10-CM | POA: Diagnosis present

## 2012-10-12 DIAGNOSIS — R112 Nausea with vomiting, unspecified: Secondary | ICD-10-CM | POA: Diagnosis present

## 2012-10-12 DIAGNOSIS — A088 Other specified intestinal infections: Principal | ICD-10-CM | POA: Diagnosis present

## 2012-10-12 DIAGNOSIS — Z6841 Body Mass Index (BMI) 40.0 and over, adult: Secondary | ICD-10-CM

## 2012-10-12 DIAGNOSIS — R509 Fever, unspecified: Secondary | ICD-10-CM | POA: Diagnosis present

## 2012-10-12 DIAGNOSIS — R197 Diarrhea, unspecified: Secondary | ICD-10-CM

## 2012-10-12 DIAGNOSIS — Z96649 Presence of unspecified artificial hip joint: Secondary | ICD-10-CM

## 2012-10-12 DIAGNOSIS — K449 Diaphragmatic hernia without obstruction or gangrene: Secondary | ICD-10-CM | POA: Diagnosis present

## 2012-10-12 DIAGNOSIS — R110 Nausea: Secondary | ICD-10-CM | POA: Diagnosis present

## 2012-10-12 DIAGNOSIS — Z85028 Personal history of other malignant neoplasm of stomach: Secondary | ICD-10-CM

## 2012-10-12 DIAGNOSIS — Z96659 Presence of unspecified artificial knee joint: Secondary | ICD-10-CM

## 2012-10-12 DIAGNOSIS — B3781 Candidal esophagitis: Secondary | ICD-10-CM | POA: Diagnosis present

## 2012-10-12 DIAGNOSIS — E118 Type 2 diabetes mellitus with unspecified complications: Secondary | ICD-10-CM | POA: Diagnosis present

## 2012-10-12 LAB — CBC WITH DIFFERENTIAL/PLATELET
Basophils Absolute: 0 10*3/uL (ref 0.0–0.1)
Basophils Relative: 0 % (ref 0–1)
Eosinophils Absolute: 0 10*3/uL (ref 0.0–0.7)
Eosinophils Absolute: 0 10*3/uL (ref 0.0–0.7)
Eosinophils Relative: 0 % (ref 0–5)
Eosinophils Relative: 0 % (ref 0–5)
HCT: 31.1 % — ABNORMAL LOW (ref 36.0–46.0)
Hemoglobin: 10.5 g/dL — ABNORMAL LOW (ref 12.0–15.0)
MCH: 28.1 pg (ref 26.0–34.0)
MCH: 28.3 pg (ref 26.0–34.0)
MCHC: 33.8 g/dL (ref 30.0–36.0)
MCV: 83.7 fL (ref 78.0–100.0)
MCV: 83.8 fL (ref 78.0–100.0)
Monocytes Absolute: 0.7 10*3/uL (ref 0.1–1.0)
Monocytes Relative: 10 % (ref 3–12)
Neutrophils Relative %: 79 % — ABNORMAL HIGH (ref 43–77)
Platelets: 273 10*3/uL (ref 150–400)
RDW: 16 % — ABNORMAL HIGH (ref 11.5–15.5)
WBC: 8.5 10*3/uL (ref 4.0–10.5)

## 2012-10-12 LAB — COMPREHENSIVE METABOLIC PANEL
ALT: 14 U/L (ref 0–35)
ALT: 17 U/L (ref 0–35)
AST: 23 U/L (ref 0–37)
AST: 29 U/L (ref 0–37)
Albumin: 2.9 g/dL — ABNORMAL LOW (ref 3.5–5.2)
Alkaline Phosphatase: 64 U/L (ref 39–117)
Calcium: 8.3 mg/dL — ABNORMAL LOW (ref 8.4–10.5)
GFR calc Af Amer: 59 mL/min — ABNORMAL LOW (ref >90)
Glucose, Bld: 227 mg/dL — ABNORMAL HIGH (ref 70–99)
Potassium: 4 mEq/L (ref 3.5–5.1)
Sodium: 133 mEq/L — ABNORMAL LOW (ref 135–145)
Sodium: 133 mEq/L — ABNORMAL LOW (ref 135–145)
Total Protein: 7 g/dL (ref 6.0–8.3)
Total Protein: 6.2 g/dL (ref 6.0–8.3)

## 2012-10-12 LAB — GLUCOSE, CAPILLARY: Glucose-Capillary: 221 mg/dL — ABNORMAL HIGH (ref 70–99)

## 2012-10-12 LAB — URINALYSIS, ROUTINE W REFLEX MICROSCOPIC
Glucose, UA: NEGATIVE mg/dL
Hgb urine dipstick: NEGATIVE
Specific Gravity, Urine: 1.014 (ref 1.005–1.030)
Urobilinogen, UA: 0.2 mg/dL (ref 0.0–1.0)
pH: 5.5 (ref 5.0–8.0)

## 2012-10-12 MED ORDER — ACETAMINOPHEN 325 MG PO TABS
ORAL_TABLET | ORAL | Status: AC
  Filled 2012-10-12: qty 2

## 2012-10-12 MED ORDER — CIPROFLOXACIN IN D5W 400 MG/200ML IV SOLN
400.0000 mg | Freq: Once | INTRAVENOUS | Status: AC
  Administered 2012-10-12: 400 mg via INTRAVENOUS
  Filled 2012-10-12: qty 200

## 2012-10-12 MED ORDER — MONTELUKAST SODIUM 10 MG PO TABS
10.0000 mg | ORAL_TABLET | Freq: Every day | ORAL | Status: DC
  Administered 2012-10-12 – 2012-10-15 (×4): 10 mg via ORAL
  Filled 2012-10-12 (×5): qty 1

## 2012-10-12 MED ORDER — MORPHINE SULFATE 4 MG/ML IJ SOLN
4.0000 mg | Freq: Once | INTRAMUSCULAR | Status: AC
  Administered 2012-10-12: 4 mg via INTRAVENOUS
  Filled 2012-10-12: qty 1

## 2012-10-12 MED ORDER — SODIUM CHLORIDE 0.9 % IV SOLN
INTRAVENOUS | Status: DC
  Administered 2012-10-13 (×2): via INTRAVENOUS

## 2012-10-12 MED ORDER — IOHEXOL 300 MG/ML  SOLN
100.0000 mL | Freq: Once | INTRAMUSCULAR | Status: AC | PRN
  Administered 2012-10-12: 100 mL via INTRAVENOUS

## 2012-10-12 MED ORDER — SODIUM CHLORIDE 0.9 % IV BOLUS (SEPSIS)
1000.0000 mL | Freq: Once | INTRAVENOUS | Status: AC
  Administered 2012-10-12: 1000 mL via INTRAVENOUS

## 2012-10-12 MED ORDER — ACETAMINOPHEN 325 MG PO TABS
650.0000 mg | ORAL_TABLET | Freq: Once | ORAL | Status: DC
  Filled 2012-10-12: qty 2

## 2012-10-12 MED ORDER — METRONIDAZOLE IN NACL 5-0.79 MG/ML-% IV SOLN
500.0000 mg | Freq: Once | INTRAVENOUS | Status: AC
  Administered 2012-10-12: 500 mg via INTRAVENOUS
  Filled 2012-10-12: qty 100

## 2012-10-12 MED ORDER — IOHEXOL 300 MG/ML  SOLN
50.0000 mL | Freq: Once | INTRAMUSCULAR | Status: AC | PRN
  Administered 2012-10-12: 50 mL via ORAL

## 2012-10-12 MED ORDER — ACETAMINOPHEN 325 MG PO TABS
650.0000 mg | ORAL_TABLET | Freq: Four times a day (QID) | ORAL | Status: DC | PRN
  Administered 2012-10-12 – 2012-10-13 (×2): 650 mg via ORAL
  Filled 2012-10-12: qty 2

## 2012-10-12 MED ORDER — ONDANSETRON HCL 4 MG/2ML IJ SOLN
4.0000 mg | Freq: Once | INTRAMUSCULAR | Status: AC
  Administered 2012-10-12: 4 mg via INTRAVENOUS
  Filled 2012-10-12: qty 2

## 2012-10-12 MED ORDER — IBUPROFEN 800 MG PO TABS
800.0000 mg | ORAL_TABLET | Freq: Once | ORAL | Status: AC
  Administered 2012-10-12: 800 mg via ORAL
  Filled 2012-10-12: qty 1

## 2012-10-12 MED ORDER — CIPROFLOXACIN IN D5W 400 MG/200ML IV SOLN
400.0000 mg | Freq: Two times a day (BID) | INTRAVENOUS | Status: DC
  Administered 2012-10-13 – 2012-10-15 (×5): 400 mg via INTRAVENOUS
  Filled 2012-10-12 (×6): qty 200

## 2012-10-12 MED ORDER — FLUOXETINE HCL 20 MG PO CAPS
20.0000 mg | ORAL_CAPSULE | Freq: Every day | ORAL | Status: DC
  Administered 2012-10-12 – 2012-10-16 (×4): 20 mg via ORAL
  Filled 2012-10-12 (×5): qty 1

## 2012-10-12 MED ORDER — METRONIDAZOLE IN NACL 5-0.79 MG/ML-% IV SOLN
500.0000 mg | Freq: Three times a day (TID) | INTRAVENOUS | Status: DC
  Administered 2012-10-13 – 2012-10-15 (×7): 500 mg via INTRAVENOUS
  Filled 2012-10-12 (×9): qty 100

## 2012-10-12 MED ORDER — INSULIN ASPART 100 UNIT/ML ~~LOC~~ SOLN
0.0000 [IU] | Freq: Three times a day (TID) | SUBCUTANEOUS | Status: DC
  Administered 2012-10-13: 2 [IU] via SUBCUTANEOUS
  Administered 2012-10-13: 4 [IU] via SUBCUTANEOUS
  Administered 2012-10-13 – 2012-10-14 (×3): 2 [IU] via SUBCUTANEOUS
  Administered 2012-10-15: 5 [IU] via SUBCUTANEOUS
  Administered 2012-10-16: 2 [IU] via SUBCUTANEOUS

## 2012-10-12 MED ORDER — HEPARIN SODIUM (PORCINE) 5000 UNIT/ML IJ SOLN
5000.0000 [IU] | Freq: Three times a day (TID) | INTRAMUSCULAR | Status: DC
  Administered 2012-10-12 – 2012-10-16 (×10): 5000 [IU] via SUBCUTANEOUS
  Filled 2012-10-12 (×14): qty 1

## 2012-10-12 MED ORDER — CARVEDILOL 6.25 MG PO TABS
6.2500 mg | ORAL_TABLET | Freq: Two times a day (BID) | ORAL | Status: DC
  Administered 2012-10-13 – 2012-10-16 (×7): 6.25 mg via ORAL
  Filled 2012-10-12 (×9): qty 1

## 2012-10-12 MED ORDER — PANTOPRAZOLE SODIUM 40 MG IV SOLR
40.0000 mg | Freq: Every day | INTRAVENOUS | Status: DC
  Administered 2012-10-12 – 2012-10-16 (×4): 40 mg via INTRAVENOUS
  Filled 2012-10-12 (×5): qty 40

## 2012-10-12 NOTE — ED Provider Notes (Signed)
History     CSN: [id number]  Arrival date & time 10/12/12  1322   First MD Initiated Contact with Patient 10/12/12 1333      Chief Complaint  Patient presents with  . Fever  . Diarrhea    (Consider location/radiation/quality/duration/timing/severity/associated sxs/prior treatment) HPI Comments: Pt presents to the ED for fever, chills, diarrhea, and left flank pain.  Pt is febrile upon arrival to the ED.  Was seen yesterday for similar sx.  Diarrhea continues to be constant but non-bloody.  Vomiting has resolved since yesterday and was able to eat a small amount of soup and crackers last night.  Pt reports that she feels weak with intermittent episodes of light-headedness.  Denies any chest pain, SOB, palpitations, or LE edema.  The history is provided by the patient.    Past Medical History  Diagnosis Date  . stomach ca dx'd 2010    chemo/xrt comp 05/2009  . Hypertension   . Diabetes mellitus   . Asthma     Past Surgical History  Procedure Laterality Date  . Colon surgery    . Total knee arthroplasty      Left  . Total hip arthroplasty      Right  . Shoulder surgery      Left  . Appendectomy    . Cesarean section    . Hernia repair      History reviewed. No pertinent family history.  History  Substance Use Topics  . Smoking status: Former Research scientist (life sciences)  . Smokeless tobacco: Never Used  . Alcohol Use: No    OB History   Grav Para Term Preterm Abortions TAB SAB Ect Mult Living                  Review of Systems  Allergies  Codeine; Zocor; and Penicillins  Home Medications   Current Outpatient Rx  Name  Route  Sig  Dispense  Refill  . albuterol (PROVENTIL) (2.5 MG/3ML) 0.083% nebulizer solution   Mouth/Throat   Use as directed 2.5 mg in the mouth or throat every 6 (six) hours as needed for wheezing. For shortness of breath         . carvedilol (COREG) 12.5 MG tablet   Oral   Take 12.5 mg by mouth every morning.          Marland Kitchen esomeprazole (NEXIUM) 40  MG capsule   Oral   Take 40 mg by mouth 2 (two) times daily.         Marland Kitchen FLUoxetine (PROZAC) 20 MG capsule   Oral   Take 20 mg by mouth daily.         Marland Kitchen glimepiride (AMARYL) 4 MG tablet   Oral   Take 4 mg by mouth daily before breakfast.          . HYDROcodone-acetaminophen (NORCO/VICODIN) 5-325 MG per tablet   Oral   Take 1 tablet by mouth every 4 (four) hours as needed for pain.         Marland Kitchen losartan (COZAAR) 50 MG tablet   Oral   Take 50 mg by mouth daily.         . montelukast (SINGULAIR) 10 MG tablet   Oral   Take 10 mg by mouth at bedtime.           . Omega-3 Fatty Acids (FISH OIL) 1000 MG CAPS   Oral   Take 1,000 mg by mouth every morning.          Marland Kitchen  sitaGLIPtan-metformin (JANUMET) 50-1000 MG per tablet   Oral   Take 1 tablet by mouth daily.          . traMADol (ULTRAM) 50 MG tablet   Oral   Take 50 mg by mouth every 8 (eight) hours as needed.         . triamterene-hydrochlorothiazide (DYAZIDE) 37.5-25 MG per capsule   Oral   Take 1 capsule by mouth daily.           BP 133/81  Pulse 112  Temp(Src) 103 F (39.4 C) (Oral)  Resp 20  SpO2 99%  Physical Exam  Nursing note and vitals reviewed. Constitutional: She is oriented to person, place, and time. She is active.  Obese  HENT:  Head: Normocephalic and atraumatic.  Mouth/Throat: Oropharynx is clear and moist.  Eyes: Conjunctivae and EOM are normal.  Neck: Normal range of motion. Neck supple.  Cardiovascular: Normal rate, regular rhythm and normal heart sounds.   Pulmonary/Chest: Effort normal and breath sounds normal.  Abdominal: Soft. Bowel sounds are normal. There is tenderness in the left upper quadrant. There is CVA tenderness (Right). There is no tenderness at McBurney's point and negative Murphy's sign.  Musculoskeletal: Normal range of motion. She exhibits no edema.  Neurological: She is alert and oriented to person, place, and time. She has normal strength. No cranial nerve  deficit or sensory deficit.  Equal strength in BUE and BLE  Skin: Skin is warm and dry.  Psychiatric: She has a normal mood and affect. Her speech is normal.    ED Course  Procedures (including critical care time)  Labs Reviewed - No data to display Ct Abdomen Pelvis W Contrast  10/12/2012  *RADIOLOGY REPORT*  Clinical Data: Mild pain, fever and diarrhea.  CT ABDOMEN AND PELVIS WITH CONTRAST  Technique:  Multidetector CT imaging of the abdomen and pelvis was performed following the standard protocol during bolus administration of intravenous contrast.  Contrast: 52m OMNIPAQUE IOHEXOL 300 MG/ML  SOLN, 1038mOMNIPAQUE IOHEXOL 300 MG/ML  SOLN  Comparison: CT of the abdomen pelvis 07/02/2012.  Findings:  Lung Bases: Small hiatal hernia.  Otherwise, unremarkable.  Abdomen/Pelvis:  Postoperative changes of distal gastrectomy and gastrojejunostomy are again noted.  The appearance of the liver, gallbladder, spleen, bilateral adrenal glands and bilateral kidneys is unremarkable.  The pancreas is atrophic, but otherwise unremarkable in appearance.  Atherosclerosis throughout the abdominal and pelvic vasculature, without definite aneurysm.  No significant volume of ascites.  No pneumoperitoneum.  There is some borderline dilated and mildly dilated loops of small bowel measuring up to 3.2 cm in diameter, predominantly in the left side of the abdomen.  No definite pathologic lymphadenopathy identified within the abdomen or pelvis.  Assessment of the pelvis is slightly limited by extensive beam hardening artifact from the patient's right-sided total hip arthroplasty.  The uterus and ovaries are unremarkable in appearance.  Urinary bladder is also unremarkable in appearance.  Musculoskeletal: Postoperative changes are noted in the anterior abdominal wall with a small chronic postoperative fluid collection in which measures up to approximately 4.2 x 1.0 cm (image 27 of series 2), favored to represent a chronic  postoperative seroma. There are no aggressive appearing lytic or blastic lesions noted in the visualized portions of the skeleton.  IMPRESSION: 1.  Multiple borderline dilated and mildly dilated loops of small bowel.  This may be chronic in this patient, however, the possibility of an early or partial small bowel obstruction is not entirely excluded.  Clinical correlation  is recommended, with consideration for followup with routine abdominal radiographs if the patient should developed worsening symptoms. 2.  Status post distal gastrectomy and gastrojejunostomy. 3.  Small hiatal hernia. 4.  Additional postoperative findings, as above, similar to prior studies. 5.  Small hiatal hernia.   Original Report Authenticated By: Vinnie Langton, M.D.    Dg Abd Acute W/chest  10/11/2012  *RADIOLOGY REPORT*  Clinical Data: Nausea.  Vomiting.  Abdominal pain.  History of gastric cancer.  Asthma.  ACUTE ABDOMEN SERIES (ABDOMEN 2 VIEW & CHEST 1 VIEW)  Comparison: 07/02/2012 CT scan  Findings: Right power port catheter tip projects over the SVC. Mild cardiomegaly noted with cardiothoracic index 53%.  No free peroneal gas is present beneath the hemidiaphragms.  Thoracic spondylosis noted.  Several air-fluid levels are present in nondilated loops of small bowel.  Air-fluid levels noted in the left colon.  No dilated bowel noted.  Right hip arthroplasty noted.  IMPRESSION:  1.  Air-fluid levels in the descending colon may reflect diarrheal process. 2.  Air-fluid levels in nondilated loops of small bowel are probably incidental but could reflect enteritis. 3.  Thoracic spondylosis. 4.  Mild cardiomegaly.   Original Report Authenticated By: Van Clines, M.D.      1. Fever   2. Diarrhea       MDM   63 y.o. Female presenting to the ED for  fever, chills, diarrhea, and left flank pain.  Pt is febrile upon arrival to the ED.  Was seen yesterday for similar sx.  Diarrhea continues to be constant but non-bloody.  CT as  above- partial SBO cannot be excluded.  Hospitalist was consulted, pt will be admitted to internal medicine.        Larene Pickett, PA-C 10/14/12 [phone]

## 2012-10-12 NOTE — ED Notes (Signed)
Pt aware urine sample is needed

## 2012-10-12 NOTE — ED Notes (Addendum)
Patient was seen here yesterday for the same.  Patient c/o fever, chills, diarrhea, right flank pain, and headache.

## 2012-10-12 NOTE — ED Notes (Signed)
Report called to [place].

## 2012-10-12 NOTE — H&P (Signed)
[name] is an 62 y.o. female.   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 Patient was noted to be tachycardic and hypotensive received fluid challenge with improvement in her blood pressure and was started on IV Cipro and Flagyl. Patient presently states she feels better has no more vomiting today CT of the abdomen showed questionable early small bowel obstruction. Patient denies any chest pain shortness of breath. Denies any palpitation lightheadedness or syncope. Denies history of PND orthopnea leg swelling. Denies any urinary complaints.  Past Medical History  Diagnosis Date  . stomach ca dx'd 2010    chemo/xrt comp 05/2009  . Hypertension   . Diabetes mellitus   . Asthma     Past Surgical History  Procedure Laterality Date  . Colon surgery    . Total knee arthroplasty      Left  . Total hip arthroplasty      Right  . Shoulder surgery      Left  . Appendectomy    . Cesarean section    . Hernia repair      History reviewed. No pertinent family history. Social History:  reports that she has quit smoking. She has never used smokeless tobacco. She reports that she does not drink alcohol or use illicit drugs.  Allergies:  Allergies  Allergen Reactions  . Codeine Other (See Comments)    paranoid  . Zocor (Simvastatin - High Dose) Other (See Comments)     Muscle spasms  . Penicillins Rash     (Not in a hospital admission)  Results for orders placed during the hospital encounter of 10/12/12 (from the past 48 hour(s))  LACTIC ACID, PLASMA     Status: None   Collection Time    10/12/12  2:30 PM      Result Value Range   Lactic Acid, Venous 1.2  0.5 - 2.2 mmol/L  CBC WITH DIFFERENTIAL     Status: Abnormal   Collection Time    10/12/12  2:30 PM      Result Value Range   WBC 8.5  4.0 - 10.5 K/uL   RBC 4.17  3.87 - 5.11 MIL/uL   Hemoglobin 11.7 (*) 12.0 - 15.0 g/dL   HCT 34.9 (*) 36.0 - 46.0 %   MCV 83.7  78.0 - 100.0 fL   MCH 28.1  26.0 - 34.0 pg   MCHC 33.5  30.0 - 36.0 g/dL   RDW 16.0 (*) 11.5 - 15.5 %   Platelets 273  150 - 400 K/uL   Neutrophils Relative 80 (*) 43 - 77 %   Neutro Abs 6.8  1.7 - 7.7 K/uL   Lymphocytes Relative 8 (*) 12 - 46 %   Lymphs Abs 0.7  0.7 - 4.0 K/uL   Monocytes Relative 12  3 - 12 %   Monocytes Absolute 1.0  0.1 - 1.0 K/uL   Eosinophils Relative 0  0 - 5 %   Eosinophils Absolute 0.0  0.0 - 0.7  K/uL   Basophils Relative 0  0 - 1 %   Basophils Absolute 0.0  0.0 - 0.1 K/uL  COMPREHENSIVE METABOLIC PANEL     Status: Abnormal   Collection Time    10/12/12  2:30 PM      Result Value Range   Sodium 133 (*) 135 - 145 mEq/L   Potassium 4.2  3.5 - 5.1 mEq/L   Chloride 102  96 - 112 mEq/L   CO2 20  19 - 32 mEq/L   Glucose, Bld 279 (*) 70 - 99 mg/dL   BUN 16  6 - 23 mg/dL   Creatinine, Ser 1.20 (*) 0.50 - 1.10 mg/dL   Calcium 8.3 (*) 8.4 - 10.5 mg/dL   Total Protein 7.0  6.0 - 8.3 g/dL   Albumin 2.9 (*) 3.5 - 5.2 g/dL   AST 23  0 - 37 U/L   ALT 14  0 - 35 U/L   Alkaline Phosphatase 71  39 - 117 U/L   Total Bilirubin 0.3  0.3 - 1.2 mg/dL   GFR calc non Af Amer 48 (*) >90 mL/min   GFR calc Af Amer 56 (*) >90 mL/min   Comment:            The eGFR has been calculated     using the CKD EPI equation.     This calculation has not been     validated in all clinical     situations.     eGFR's  persistently     <90 mL/min signify     possible Chronic Kidney Disease.  LIPASE, BLOOD     Status: None   Collection Time    10/12/12  2:30 PM      Result Value Range   Lipase 11  11 - 59 U/L  URINALYSIS, ROUTINE W REFLEX MICROSCOPIC     Status: Abnormal   Collection Time    10/12/12  3:54 PM      Result Value Range   Color, Urine YELLOW  YELLOW   APPearance CLOUDY (*) CLEAR   Specific Gravity, Urine 1.014  1.005 - 1.030   pH 5.5  5.0 - 8.0   Glucose, UA NEGATIVE  NEGATIVE mg/dL   Hgb urine dipstick NEGATIVE  NEGATIVE   Bilirubin Urine NEGATIVE  NEGATIVE   Ketones, ur NEGATIVE  NEGATIVE mg/dL   Protein, ur NEGATIVE  NEGATIVE mg/dL   Urobilinogen, UA 0.2  0.0 - 1.0 mg/dL   Nitrite NEGATIVE  NEGATIVE   Leukocytes, UA NEGATIVE  NEGATIVE   Comment: MICROSCOPIC NOT DONE ON URINES WITH NEGATIVE PROTEIN, BLOOD, LEUKOCYTES, NITRITE, OR GLUCOSE <1000 mg/dL.   Ct Abdomen Pelvis W Contrast  10/12/2012  *RADIOLOGY REPORT*  Clinical Data: Mild pain, fever and diarrhea.  CT ABDOMEN AND PELVIS WITH CONTRAST  Technique:  Multidetector CT imaging of the abdomen and pelvis was performed following the standard protocol during bolus administration of intravenous contrast.  Contrast: 35m OMNIPAQUE IOHEXOL 300 MG/ML  SOLN, 105mOMNIPAQUE IOHEXOL 300 MG/ML  SOLN  Comparison: CT of the abdomen pelvis 07/02/2012.  Findings:  Lung Bases: Small hiatal hernia.  Otherwise, unremarkable.  Abdomen/Pelvis:  Postoperative changes of distal gastrectomy and gastrojejunostomy are again noted.  The appearance of the liver, gallbladder, spleen, bilateral adrenal glands and bilateral kidneys is unremarkable.  The pancreas is atrophic, but otherwise unremarkable in appearance.  Atherosclerosis throughout the abdominal and pelvic vasculature, without definite aneurysm.  No significant volume of ascites.  No pneumoperitoneum.  There is  some borderline dilated and mildly dilated loops of small bowel measuring up to 3.2 cm in  diameter, predominantly in the left side of the abdomen.  No definite pathologic lymphadenopathy identified within the abdomen or pelvis.  Assessment of the pelvis is slightly limited by extensive beam hardening artifact from the patient's right-sided total hip arthroplasty.  The uterus and ovaries are unremarkable in appearance.  Urinary bladder is also unremarkable in appearance.  Musculoskeletal: Postoperative changes are noted in the anterior abdominal wall with a small chronic postoperative fluid collection in which measures up to approximately 4.2 x 1.0 cm (image 27 of series 2), favored to represent a chronic postoperative seroma. There are no aggressive appearing lytic or blastic lesions noted in the visualized portions of the skeleton.  IMPRESSION: 1.  Multiple borderline dilated and mildly dilated loops of small bowel.  This may be chronic in this patient, however, the possibility of an early or partial small bowel obstruction is not entirely excluded.  Clinical correlation is recommended, with consideration for followup with routine abdominal radiographs if the patient should developed worsening symptoms. 2.  Status post distal gastrectomy and gastrojejunostomy. 3.  Small hiatal hernia. 4.  Additional postoperative findings, as above, similar to prior studies. 5.  Small hiatal hernia.   Original Report Authenticated By: Vinnie Langton, M.D.    Dg Abd Acute W/chest  10/11/2012  *RADIOLOGY REPORT*  Clinical Data: Nausea.  Vomiting.  Abdominal pain.  History of gastric cancer.  Asthma.  ACUTE ABDOMEN SERIES (ABDOMEN 2 VIEW & CHEST 1 VIEW)  Comparison: 07/02/2012 CT scan  Findings: Right power port catheter tip projects over the SVC. Mild cardiomegaly noted with cardiothoracic index 53%.  No free peroneal gas is present beneath the hemidiaphragms.  Thoracic spondylosis noted.  Several air-fluid levels are present in nondilated loops of small bowel.  Air-fluid levels noted in the left colon.  No dilated  bowel noted.  Right hip arthroplasty noted.  IMPRESSION:  1.  Air-fluid levels in the descending colon may reflect diarrheal process. 2.  Air-fluid levels in nondilated loops of small bowel are probably incidental but could reflect enteritis. 3.  Thoracic spondylosis. 4.  Mild cardiomegaly.   Original Report Authenticated By: Van Clines, M.D.     Review of Systems  Constitutional: Positive for fever and chills.  Respiratory: Negative for cough, hemoptysis, sputum production and shortness of breath.   Cardiovascular: Negative for chest pain, palpitations, orthopnea and claudication.  Gastrointestinal: Positive for nausea, vomiting, abdominal pain and diarrhea.  Genitourinary: Negative for dysuria.  Neurological: Negative for dizziness.    Blood pressure 99/61, pulse 89, temperature 99.2 F (37.3 C), temperature source Oral, resp. rate 20, SpO2 100.00%. Physical Exam  Constitutional: She is oriented to person, place, and time. She appears well-developed and well-nourished.  HENT:  Head: Normocephalic and atraumatic.  Mouth/Throat: No oropharyngeal exudate.  Eyes: Conjunctivae are normal. Pupils are equal, round, and reactive to light. Left eye exhibits no discharge. No scleral icterus.  Neck: Normal range of motion. Neck supple. No JVD present. No tracheal deviation present. No thyromegaly present.  Cardiovascular: Regular rhythm and normal heart sounds.  Exam reveals no gallop and no friction rub.   Respiratory: Effort normal and breath sounds normal. No respiratory distress. She has no wheezes. She has no rales.  GI:  Soft obese mild generalized tenderness bowel sounds present no guarding or rebound tenderness  Musculoskeletal: She exhibits no edema and no tenderness.  Neurological: She is alert and oriented to person, place,  and time.     Assessment/Plan Abdominal pain rule out colitis/gastroenteritis/early small bowel obstruction History of adenocarcinoma stomach status post  resection in the past status post chemotherapy and radiation in the past stable Hypertension Diabetes mellitus Morbid obesity History of bronchial asthma Plan As per orders Clear liquids today only if able to tolerate will advance the diet. May need to inserted G-tube to low Gomco suction if not able to tolerate by mouth or starts vomiting. Check stool cultures and C. difficile toxin Agree with Cipro and Flagyl for now pending cultures  Makiyla Linch N 10/12/2012, 7:09 PM

## 2012-10-12 NOTE — Progress Notes (Signed)
ANTIBIOTIC CONSULT NOTE - INITIAL  Pharmacy Consult for Cipro Indication: Probable colitis  Allergies  Allergen Reactions  . Codeine Other (See Comments)    paranoid  . Zocor (Simvastatin - High Dose) Other (See Comments)    Muscle spasms  . Penicillins Rash    Patient Measurements: Height: 5' 7"  (170.2 cm) Weight: 284 lb 2.8 oz (128.9 kg) IBW/kg (Calculated) : 61.6   Vital Signs: Temp: 99.2 F (37.3 C) (03/21 1905) Temp src: Oral (03/21 2001) BP: 142/72 mmHg (03/21 2001) Pulse Rate: 108 (03/21 2001) Intake/Output from previous day:   Intake/Output from this shift:    Labs:  Recent Labs  10/11/12 1105 10/12/12 1430  WBC 9.0 8.5  HGB 12.9 11.7*  PLT 341 273  CREATININE 1.32* 1.20*   Estimated Creatinine Clearance: 69.7 ml/min (by C-G formula based on Cr of 1.2). No results found for this basename: VANCOTROUGH, VANCOPEAK, VANCORANDOM, GENTTROUGH, GENTPEAK, GENTRANDOM, TOBRATROUGH, TOBRAPEAK, TOBRARND, AMIKACINPEAK, AMIKACINTROU, AMIKACIN,  in the last 72 hours   Microbiology: No results found for this or any previous visit (from the past 720 hour(s)).  Medical History: Past Medical History  Diagnosis Date  . stomach ca dx'd 2010    chemo/xrt comp 05/2009  . Hypertension   . Diabetes mellitus   . Asthma     Medications:  Scheduled:  . acetaminophen      . acetaminophen  650 mg Oral Once  . [COMPLETED] ciprofloxacin  400 mg Intravenous Once  . ciprofloxacin  400 mg Intravenous Q12H  . FLUoxetine  20 mg Oral Daily  . heparin  5,000 Units Subcutaneous Q8H  . [COMPLETED] ibuprofen  800 mg Oral Once  . [COMPLETED] metronidazole  500 mg Intravenous Once  . [START ON 10/13/2012] metronidazole  500 mg Intravenous Q8H  . montelukast  10 mg Oral QHS  . [COMPLETED] morphine  4 mg Intravenous Once  . [COMPLETED] ondansetron (ZOFRAN) IV  4 mg Intravenous Once  . pantoprazole (PROTONIX) IV  40 mg Intravenous Daily  . [COMPLETED] sodium chloride  1,000 mL  Intravenous Once  . [COMPLETED] sodium chloride  1,000 mL Intravenous Once   Infusions:  . sodium chloride     Assessment: 62 yo c/o abdominal pain associated with n/v/diarrhea and chills.  MD ordering Cipro per Rx and Flagy for colitis.  Goal of Therapy:  Treat infection  Plan:   Cipro 451m IV q12h.  F/U Scr as needed.  GDorrene German3/21/2014,8:21 PM

## 2012-10-13 ENCOUNTER — Inpatient Hospital Stay (HOSPITAL_COMMUNITY): Payer: Medicare Other

## 2012-10-13 LAB — URINALYSIS, ROUTINE W REFLEX MICROSCOPIC
Glucose, UA: NEGATIVE mg/dL
Hgb urine dipstick: NEGATIVE
Specific Gravity, Urine: 1.019 (ref 1.005–1.030)
pH: 5.5 (ref 5.0–8.0)

## 2012-10-13 LAB — CBC
Hemoglobin: 10.8 g/dL — ABNORMAL LOW (ref 12.0–15.0)
MCH: 28.9 pg (ref 26.0–34.0)
MCHC: 35 g/dL (ref 30.0–36.0)
MCV: 82.6 fL (ref 78.0–100.0)
Platelets: 242 10*3/uL (ref 150–400)

## 2012-10-13 LAB — BASIC METABOLIC PANEL
CO2: 20 mEq/L (ref 19–32)
Calcium: 8 mg/dL — ABNORMAL LOW (ref 8.4–10.5)
Creatinine, Ser: 1.19 mg/dL — ABNORMAL HIGH (ref 0.50–1.10)
GFR calc non Af Amer: 49 mL/min — ABNORMAL LOW (ref >90)
Glucose, Bld: 179 mg/dL — ABNORMAL HIGH (ref 70–99)
Sodium: 134 mEq/L — ABNORMAL LOW (ref 135–145)

## 2012-10-13 LAB — GLUCOSE, CAPILLARY: Glucose-Capillary: 173 mg/dL — ABNORMAL HIGH (ref 70–99)

## 2012-10-13 LAB — CLOSTRIDIUM DIFFICILE BY PCR: Toxigenic C. Difficile by PCR: NEGATIVE

## 2012-10-13 MED ORDER — MORPHINE SULFATE 2 MG/ML IJ SOLN
2.0000 mg | Freq: Once | INTRAMUSCULAR | Status: AC
  Administered 2012-10-13: 2 mg via INTRAVENOUS
  Filled 2012-10-13: qty 1

## 2012-10-13 MED ORDER — ACETAMINOPHEN 325 MG PO TABS
650.0000 mg | ORAL_TABLET | ORAL | Status: DC | PRN
  Administered 2012-10-13 – 2012-10-15 (×4): 650 mg via ORAL
  Filled 2012-10-13 (×3): qty 2

## 2012-10-13 MED ORDER — OXYCODONE-ACETAMINOPHEN 5-325 MG PO TABS
1.0000 | ORAL_TABLET | Freq: Four times a day (QID) | ORAL | Status: DC | PRN
  Administered 2012-10-13 – 2012-10-15 (×5): 1 via ORAL
  Filled 2012-10-13 (×5): qty 1

## 2012-10-13 MED ORDER — SACCHAROMYCES BOULARDII 250 MG PO CAPS
250.0000 mg | ORAL_CAPSULE | Freq: Two times a day (BID) | ORAL | Status: DC
  Administered 2012-10-13 – 2012-10-16 (×6): 250 mg via ORAL
  Filled 2012-10-13 (×8): qty 1

## 2012-10-13 MED ORDER — ONDANSETRON HCL 4 MG/2ML IJ SOLN
4.0000 mg | Freq: Once | INTRAMUSCULAR | Status: AC
  Administered 2012-10-13: 4 mg via INTRAVENOUS
  Filled 2012-10-13: qty 2

## 2012-10-13 NOTE — Progress Notes (Signed)
Patient arrived to floor around 2000pm via bed from ED.  Patients temperature upon arrival was 104 axillary.  Patient was given tylenol PTA at 1945pm.  MD was notified and orders received for tylenol and blood cultures x2 (Lab was only able to draw one set; patient refused the second set since she was difficult to stick and it was painful).  Patient had a few loose stools throughout the night (cultures sent) and urine cultures also sent.  Patient's temp continued to spike and then go back down throughout the night.  She was given q4 hour tylenol prn for her temperatures, a cool wash cloth for her head, and ice bags to reduce her temperature.  Pt. Reported abdominal pain this am and MD was notified for a one time order of morphine and zofran IV, which helped.  Will continue to monitor.Azzie Glatter [place]

## 2012-10-13 NOTE — Progress Notes (Signed)
Subjective:  Continues to complain of generalized abdominal pain associated with diarrhea. Vomiting resolved patient spiked to 103 last night cultures so far are pending. States feels hungry  Objective:  Vital Signs in the last 24 hours: Temp:  [99.2 F (37.3 C)-104 F (40 C)] 100 F (37.8 C) (03/22 0919) Pulse Rate:  [89-112] 101 (03/22 0520) Resp:  [20-22] 22 (03/22 0520) BP: (77-142)/(43-81) 125/65 mmHg (03/22 0520) SpO2:  [92 %-100 %] 100 % (03/22 0520) Weight:  [128.9 kg (284 lb 2.8 oz)] 128.9 kg (284 lb 2.8 oz) (03/21 2001)  Intake/Output from previous day: 03/21 0701 - 03/22 0700 In: 595 [P.O.:120; I.V.:175; IV Piggyback:300] Out: 1450 [Urine:1450] Intake/Output from this shift: Total I/O In: 1000 [I.V.:1000] Out: -   Physical Exam: Neck: no adenopathy, no carotid bruit, no JVD and supple, symmetrical, trachea midline Lungs: clear to auscultation bilaterally Heart: regular rate and rhythm, S1, S2 normal, no murmur, click, rub or gallop Abdomen: Mildly distended multiple surgical scars generalized tenderness bowel sounds present no guarding Extremities: extremities normal, atraumatic, no cyanosis or edema  Lab Results:  Recent Labs  10/12/12 2215 10/13/12 0400  WBC 7.2 7.3  HGB 10.5* 10.8*  PLT 253 242    Recent Labs  10/12/12 2215 10/13/12 0400  NA 133* 134*  K 4.0 4.0  CL 104 105  CO2 20 20  GLUCOSE 227* 179*  BUN 14 12  CREATININE 1.14* 1.19*   No results found for this basename: TROPONINI, CK, MB,  in the last 72 hours Hepatic Function Panel  Recent Labs  10/12/12 2215  PROT 6.2  ALBUMIN 2.5*  AST 29  ALT 17  ALKPHOS 64  BILITOT 0.2*   No results found for this basename: CHOL,  in the last 72 hours No results found for this basename: PROTIME,  in the last 72 hours  Imaging: Imaging results have been reviewed and Ct Abdomen Pelvis W Contrast  10/12/2012  *RADIOLOGY REPORT*  Clinical Data: Mild pain, fever and diarrhea.  CT ABDOMEN AND  PELVIS WITH CONTRAST  Technique:  Multidetector CT imaging of the abdomen and pelvis was performed following the standard protocol during bolus administration of intravenous contrast.  Contrast: 62m OMNIPAQUE IOHEXOL 300 MG/ML  SOLN, 1066mOMNIPAQUE IOHEXOL 300 MG/ML  SOLN  Comparison: CT of the abdomen pelvis 07/02/2012.  Findings:  Lung Bases: Small hiatal hernia.  Otherwise, unremarkable.  Abdomen/Pelvis:  Postoperative changes of distal gastrectomy and gastrojejunostomy are again noted.  The appearance of the liver, gallbladder, spleen, bilateral adrenal glands and bilateral kidneys is unremarkable.  The pancreas is atrophic, but otherwise unremarkable in appearance.  Atherosclerosis throughout the abdominal and pelvic vasculature, without definite aneurysm.  No significant volume of ascites.  No pneumoperitoneum.  There is some borderline dilated and mildly dilated loops of small bowel measuring up to 3.2 cm in diameter, predominantly in the left side of the abdomen.  No definite pathologic lymphadenopathy identified within the abdomen or pelvis.  Assessment of the pelvis is slightly limited by extensive beam hardening artifact from the patient's right-sided total hip arthroplasty.  The uterus and ovaries are unremarkable in appearance.  Urinary bladder is also unremarkable in appearance.  Musculoskeletal: Postoperative changes are noted in the anterior abdominal wall with a small chronic postoperative fluid collection in which measures up to approximately 4.2 x 1.0 cm (image 27 of series 2), favored to represent a chronic postoperative seroma. There are no aggressive appearing lytic or blastic lesions noted in the visualized portions of the  skeleton.  IMPRESSION: 1.  Multiple borderline dilated and mildly dilated loops of small bowel.  This may be chronic in this patient, however, the possibility of an early or partial small bowel obstruction is not entirely excluded.  Clinical correlation is recommended,  with consideration for followup with routine abdominal radiographs if the patient should developed worsening symptoms. 2.  Status post distal gastrectomy and gastrojejunostomy. 3.  Small hiatal hernia. 4.  Additional postoperative findings, as above, similar to prior studies. 5.  Small hiatal hernia.   Original Report Authenticated By: Vinnie Langton, M.D.    Acute Abdominal Series  10/13/2012  *RADIOLOGY REPORT*  Clinical Data: Abdominal pain, partial small bowel obstruction  ACUTE ABDOMEN SERIES (ABDOMEN 2 VIEW & CHEST 1 VIEW)  Comparison: CT 10/12/2012  Findings: Right IJ port catheter stable.  Relatively low lung volumes.  Heart size upper limits normal.  No focal infiltrate.  No effusion.  No free air.  The small bowel is relatively decompressed.  There is some residual oral contrast material in the colon.  Multiple fluid levels are noted primarily in the colon on the erect radiograph.  No free air.  Right hip arthroplasty hardware partially seen.  IMPRESSION:  Fluid levels in nondilated colon.  No evidence of obstruction or free air.   Original Report Authenticated By: D. Wallace Going, MD    Dg Abd Acute W/chest  10/11/2012  *RADIOLOGY REPORT*  Clinical Data: Nausea.  Vomiting.  Abdominal pain.  History of gastric cancer.  Asthma.  ACUTE ABDOMEN SERIES (ABDOMEN 2 VIEW & CHEST 1 VIEW)  Comparison: 07/02/2012 CT scan  Findings: Right power port catheter tip projects over the SVC. Mild cardiomegaly noted with cardiothoracic index 53%.  No free peroneal gas is present beneath the hemidiaphragms.  Thoracic spondylosis noted.  Several air-fluid levels are present in nondilated loops of small bowel.  Air-fluid levels noted in the left colon.  No dilated bowel noted.  Right hip arthroplasty noted.  IMPRESSION:  1.  Air-fluid levels in the descending colon may reflect diarrheal process. 2.  Air-fluid levels in nondilated loops of small bowel are probably incidental but could reflect enteritis. 3.  Thoracic  spondylosis. 4.  Mild cardiomegaly.   Original Report Authenticated By: Van Clines, M.D.     Cardiac Studies:  Assessment/Plan:  Abdominal pain rule out colitis/gastroenteritis Status post early partial small bowel obstruction status post multiple laparotomies in the past History of adenocarcinoma stomach status post resection in the past status post chemotherapy and radiation in the past stable  Hypertension  Diabetes mellitus  Morbid obesity  History of bronchial asthma  Plan Continue present management Check cultures GI consult Advance diet as per GI no procedures planned   LOS: 1 day    Emilija Bohman N 10/13/2012, 11:21 AM

## 2012-10-13 NOTE — Consult Note (Signed)
Referring Provider: Dr. Terrence Dupont Primary Care Physician:  Patricia Nettle, MD Primary Gastroenterologist:  Dr. Michail Sermon  Reason for Consultation:  Nausea and vomiting (resolved), diarrhea, chills, fever  HPI: [name] is a 62 y.o. female admitted to the hospital yesterday on behalf of her primary physician by a covering cardiologist, because of a two-day history of fevers, shaking chills, and nausea and vomiting.  The nausea and vomiting have gotten better, but she has continued to have diarrhea.   There has been some abdominal discomfort but no severe pain.   An abdominal CT on presentation showed some dilated loops of small bowel raising the question of obstruction. At this time, however, the patient has neither nausea nor vomiting, and in fact, is asking for food because she is hungry.   On the other hand, the diarrhea has persisted, roughly 5 times over the past 24 hours.   Both the emesis and the diarrhea has been consistently nonbloody and non-melenic in character.   The patient was in her usual state of health until the fairly abrupt onset of the symptoms 2 days ago. For several months, she has been having left lower quadrant pain prompted by bending over, which starts in her back and goes around to the left lower quadrant and sounds like possibly a lumbar radiculopathy.   No prodromal loss of appetite. Of note, the patient has a past history of gastric cancer diagnosed endoscopically about 5 years ago and treated with distal gastrectomy and chemotherapy. She is followed for that by Dr. Lattie Haw, whom she most recently saw several months ago. She is not known to have any recurrent or residual disease, and her current CT scan did not show any evidence of recurrent cancer.   Stool studies for C. differential and enteric pathogens are pending at this time, as is a urine culture.  Her white count has been consistently normal in the hospital. The patient denies recent exposure to  antibiotics  It should be noted that there is apparently a virus going around town with many patients reporting nausea, vomiting, and diarrhea, although the patients we have talked to on the phone or seen in the office have not had accompanying fever and chills as this patient has had.   Note that she has normal liver chemistries which would go against biliary tract origin of her fever, and her urinalysis does not support a UTI. Urine culture is pending.     Past Medical History  Diagnosis Date  . stomach ca dx'd 2010    chemo/xrt comp 05/2009  . Hypertension   . Diabetes mellitus   . Asthma     Past Surgical History  Procedure Laterality Date  . Distal gastrectomy    . Total knee arthroplasty      Left  . Total hip arthroplasty      Right  . Shoulder surgery      Left  . Appendectomy    . Cesarean section    . Hernia repair      Prior to Admission medications   Medication Sig Start Date End Date Taking? Authorizing Provider  albuterol (PROVENTIL) (2.5 MG/3ML) 0.083% nebulizer solution Use as directed 2.5 mg in the mouth or throat every 6 (six) hours as needed for wheezing. For shortness of breath   Yes Historical Provider, MD  carvedilol (COREG) 12.5 MG tablet Take 12.5 mg by mouth every morning.    Yes Historical Provider, MD  esomeprazole (NEXIUM) 40 MG capsule Take 40 mg by  mouth 2 (two) times daily. 05/11/12  Yes Volanda Napoleon, MD  FLUoxetine (PROZAC) 20 MG capsule Take 20 mg by mouth daily. 08/31/12  Yes Volanda Napoleon, MD  glimepiride (AMARYL) 4 MG tablet Take 4 mg by mouth daily before breakfast.  08/28/12  Yes Historical Provider, MD  HYDROcodone-acetaminophen (NORCO/VICODIN) 5-325 MG per tablet Take 1 tablet by mouth every 4 (four) hours as needed for pain. 09/08/12  Yes Hoy Morn, MD  losartan (COZAAR) 50 MG tablet Take 50 mg by mouth daily.   Yes Historical Provider, MD  montelukast (SINGULAIR) 10 MG tablet Take 10 mg by mouth at bedtime.     Yes Historical  Provider, MD  Omega-3 Fatty Acids (FISH OIL) 1000 MG CAPS Take 1,000 mg by mouth every morning.    Yes Historical Provider, MD  sitaGLIPtan-metformin (JANUMET) 50-1000 MG per tablet Take 1 tablet by mouth daily.    Yes Historical Provider, MD  traMADol (ULTRAM) 50 MG tablet Take 50 mg by mouth every 8 (eight) hours as needed.   Yes Historical Provider, MD  triamterene-hydrochlorothiazide (DYAZIDE) 37.5-25 MG per capsule Take 1 capsule by mouth daily. 02/17/12 02/16/13 Yes Volanda Napoleon, MD    Current Facility-Administered Medications  Medication Dose Route Frequency Provider Last Rate Last Dose  . 0.9 %  sodium chloride infusion   Intravenous Continuous Clent Demark, MD 100 mL/hr at 10/13/12 0603    . acetaminophen (TYLENOL) tablet 650 mg  650 mg Oral Once Babette Relic, MD      . acetaminophen (TYLENOL) tablet 650 mg  650 mg Oral Q4H PRN Clent Demark, MD   650 mg at 10/13/12 1006  . carvedilol (COREG) tablet 6.25 mg  6.25 mg Oral BID WC Clent Demark, MD   6.25 mg at 10/13/12 0829  . ciprofloxacin (CIPRO) IVPB 400 mg  400 mg Intravenous Q12H Dorrene German, RPH   400 mg at 10/13/12 0534  . FLUoxetine (PROZAC) capsule 20 mg  20 mg Oral Daily Clent Demark, MD   20 mg at 10/13/12 1004  . heparin injection 5,000 Units  5,000 Units Subcutaneous Q8H Clent Demark, MD   5,000 Units at 10/13/12 0533  . insulin aspart (novoLOG) injection 0-9 Units  0-9 Units Subcutaneous TID WC Clent Demark, MD   4 Units at 10/13/12 1003  . metroNIDAZOLE (FLAGYL) IVPB 500 mg  500 mg Intravenous Q8H Clent Demark, MD   500 mg at 10/13/12 0140  . montelukast (SINGULAIR) tablet 10 mg  10 mg Oral QHS Clent Demark, MD   10 mg at 10/12/12 2228  . pantoprazole (PROTONIX) injection 40 mg  40 mg Intravenous Daily Clent Demark, MD   40 mg at 10/13/12 1004    Allergies as of 10/12/2012 - Review Complete 10/12/2012  Allergen Reaction Noted  . Codeine Other (See Comments)   . Zocor (simvastatin - high  dose) Other (See Comments) 09/07/2011  . Penicillins Rash 01/03/2008    History reviewed. No pertinent family history.  History   Social History  . Marital Status: Divorced    Spouse Name: N/A    Number of Children: N/A  . Years of Education: N/A   Occupational History  . Not on file.   Social History Main Topics  . Smoking status: Former Research scientist (life sciences)  . Smokeless tobacco: Never Used  . Alcohol Use: No  . Drug Use: No  . Sexually Active: Not on file   Other Topics  Concern  . Not on file   Social History Narrative  . No narrative on file    Review of Systems: As noted above, no prodromal anorexia or, to my knowledge, weight loss or chronic GI symptoms apart from the above-mentioned pain when bending over.  The patient has had colonoscopy 4 years ago which showed only some hyperplastic polyps.    Physical Exam: Vital signs in last 24 hours: Temp:  [99.2 F (37.3 C)-104 F (40 C)] 101.4 F (38.6 C) (03/22 1030) Pulse Rate:  [89-112] 101 (03/22 0520) Resp:  [20-22] 22 (03/22 0520) BP: (77-142)/(43-81) 125/65 mmHg (03/22 0520) SpO2:  [92 %-100 %] 100 % (03/22 0520) Weight:  [128.9 kg (284 lb 2.8 oz)] 128.9 kg (284 lb 2.8 oz) (03/21 2001) Last BM Date: 10/12/12 General:   Alert,  Well-developed, severely overweight, pleasant and cooperative in NAD Head:  Normocephalic and atraumatic. Eyes:  Sclera clear, no icterus.   Conjunctiva pink. Mouth:   No ulcerations or lesions.  Oropharynx pink & moist. Neck:   No masses or thyromegaly. Lungs:  Clear throughout to auscultation.   No wheezes, crackles, or rhonchi. No evident respiratory distress. Heart:   Regular rate and rhythm; no murmurs, clicks, rubs,  or gallops. Abdomen:  There is some voluntary guarding and "touchiness" especially in the left upper quadrant, which is somewhat reproducible but not highly impressive. Overall, the abdomen is soft, nontympanitic, and nondistended. No masses, hepatosplenomegaly or ventral hernias  noted. Normal nonobstructive but active bowel sounds, without bruits, or rebound.   Msk:   Symmetrical without gross deformities. Extremities:   Without clubbing, cyanosis, or edema. Neurologic:  Alert and coherent;  grossly normal neurologically. Skin:  Intact without significant lesions or rashes. Cervical Nodes:  No significant cervical adenopathy. Psych:   Alert and cooperative. Normal mood and affect.  Intake/Output from previous day: 03/21 0701 - 03/22 0700 In: 595 [P.O.:120; I.V.:175; IV Piggyback:300] Out: 1450 [Urine:1450] Intake/Output this shift: Total I/O In: 1000 [I.V.:1000] Out: -   Lab Results:  Recent Labs  10/12/12 1430 10/12/12 2215 10/13/12 0400  WBC 8.5 7.2 7.3  HGB 11.7* 10.5* 10.8*  HCT 34.9* 31.1* 30.9*  PLT 273 253 242   BMET  Recent Labs  10/12/12 1430 10/12/12 2215 10/13/12 0400  NA 133* 133* 134*  K 4.2 4.0 4.0  CL 102 104 105  CO2 20 20 20   GLUCOSE 279* 227* 179*  BUN 16 14 12   CREATININE 1.20* 1.14* 1.19*  CALCIUM 8.3* 7.8* 8.0*   LFT  Recent Labs  10/12/12 2215  PROT 6.2  ALBUMIN 2.5*  AST 29  ALT 17  ALKPHOS 64  BILITOT 0.2*   PT/INR No results found for this basename: LABPROT, INR,  in the last 72 hours  C-Diff --pending  Studies/Results: Ct Abdomen Pelvis W Contrast  10/12/2012  *RADIOLOGY REPORT*  Clinical Data: Mild pain, fever and diarrhea.  CT ABDOMEN AND PELVIS WITH CONTRAST  Technique:  Multidetector CT imaging of the abdomen and pelvis was performed following the standard protocol during bolus administration of intravenous contrast.  Contrast: 20m OMNIPAQUE IOHEXOL 300 MG/ML  SOLN, 1073mOMNIPAQUE IOHEXOL 300 MG/ML  SOLN  Comparison: CT of the abdomen pelvis 07/02/2012.  Findings:  Lung Bases: Small hiatal hernia.  Otherwise, unremarkable.  Abdomen/Pelvis:  Postoperative changes of distal gastrectomy and gastrojejunostomy are again noted.  The appearance of the liver, gallbladder, spleen, bilateral adrenal  glands and bilateral kidneys is unremarkable.  The pancreas is atrophic, but otherwise unremarkable  in appearance.  Atherosclerosis throughout the abdominal and pelvic vasculature, without definite aneurysm.  No significant volume of ascites.  No pneumoperitoneum.  There is some borderline dilated and mildly dilated loops of small bowel measuring up to 3.2 cm in diameter, predominantly in the left side of the abdomen.  No definite pathologic lymphadenopathy identified within the abdomen or pelvis.  Assessment of the pelvis is slightly limited by extensive beam hardening artifact from the patient's right-sided total hip arthroplasty.  The uterus and ovaries are unremarkable in appearance.  Urinary bladder is also unremarkable in appearance.  Musculoskeletal: Postoperative changes are noted in the anterior abdominal wall with a small chronic postoperative fluid collection in which measures up to approximately 4.2 x 1.0 cm (image 27 of series 2), favored to represent a chronic postoperative seroma. There are no aggressive appearing lytic or blastic lesions noted in the visualized portions of the skeleton.  IMPRESSION: 1.  Multiple borderline dilated and mildly dilated loops of small bowel.  This may be chronic in this patient, however, the possibility of an early or partial small bowel obstruction is not entirely excluded.  Clinical correlation is recommended, with consideration for followup with routine abdominal radiographs if the patient should developed worsening symptoms. 2.  Status post distal gastrectomy and gastrojejunostomy. 3.  Small hiatal hernia. 4.  Additional postoperative findings, as above, similar to prior studies. 5.  Small hiatal hernia.   Original Report Authenticated By: Vinnie Langton, M.D.    Acute Abdominal Series  10/13/2012  *RADIOLOGY REPORT*  Clinical Data: Abdominal pain, partial small bowel obstruction  ACUTE ABDOMEN SERIES (ABDOMEN 2 VIEW & CHEST 1 VIEW)  Comparison: CT 10/12/2012   Findings: Right IJ port catheter stable.  Relatively low lung volumes.  Heart size upper limits normal.  No focal infiltrate.  No effusion.  No free air.  The small bowel is relatively decompressed.  There is some residual oral contrast material in the colon.  Multiple fluid levels are noted primarily in the colon on the erect radiograph.  No free air.  Right hip arthroplasty hardware partially seen.  IMPRESSION:  Fluid levels in nondilated colon.  No evidence of obstruction or free air.   Original Report Authenticated By: D. Wallace Going, MD     Impression: 1. Acute illness characterized by abrupt onset of both upper and lower GI tract symptoms (respectively, nausea/vomiting, and diarrhea) and fever. This to me is most compatible with an acute infectious illness, most likely viral, especially in view of the normal white count. The fact that the nausea and vomiting have resolved suggests that the patient may be getting better  2. Prior history of gastric adenocarcinoma 4 years ago, without clinical or radiographic evidence of recurrence  3. Up-to-date on colon cancer screening  Plan: Expectant management with supportive care as is currently being done. The patient is on Cipro and Flagyl, which I feel is reasonable given her fevers, even though of bacterial focus of infection is not identified.  I will add probiotics to the patient's regimen and check stool for occult blood.  Further management will depend on the patient's clinical evolution, but could potentially include an endoscopy to exclude local recurrence of her gastric cancer (doubt), and colonoscopy to look for evidence of colitis.   LOS: 1 day   [name] V  10/13/2012, 12:48 PM

## 2012-10-14 LAB — COMPREHENSIVE METABOLIC PANEL
ALT: 29 U/L (ref 0–35)
Alkaline Phosphatase: 62 U/L (ref 39–117)
CO2: 21 mEq/L (ref 19–32)
GFR calc Af Amer: 66 mL/min — ABNORMAL LOW (ref >90)
GFR calc non Af Amer: 57 mL/min — ABNORMAL LOW (ref >90)
Glucose, Bld: 171 mg/dL — ABNORMAL HIGH (ref 70–99)
Potassium: 3.5 mEq/L (ref 3.5–5.1)
Sodium: 135 mEq/L (ref 135–145)

## 2012-10-14 LAB — URINE CULTURE: Colony Count: >=100000

## 2012-10-14 LAB — GLUCOSE, CAPILLARY
Glucose-Capillary: 181 mg/dL — ABNORMAL HIGH (ref 70–99)
Glucose-Capillary: 195 mg/dL — ABNORMAL HIGH (ref 70–99)

## 2012-10-14 LAB — CBC WITH DIFFERENTIAL/PLATELET
Eosinophils Relative: 0 % (ref 0–5)
Hemoglobin: 9.7 g/dL — ABNORMAL LOW (ref 12.0–15.0)
Lymphocytes Relative: 21 % (ref 12–46)
Lymphs Abs: 1.4 10*3/uL (ref 0.7–4.0)
MCV: 82.2 fL (ref 78.0–100.0)
Neutrophils Relative %: 70 % (ref 43–77)
Platelets: 217 10*3/uL (ref 150–400)
RBC: 3.42 MIL/uL — ABNORMAL LOW (ref 3.87–5.11)
WBC: 6.8 10*3/uL (ref 4.0–10.5)

## 2012-10-14 MED ORDER — ALBUTEROL SULFATE (5 MG/ML) 0.5% IN NEBU: 2.5000 mg | INHALATION_SOLUTION | Freq: Four times a day (QID) | RESPIRATORY_TRACT | Status: DC | PRN

## 2012-10-14 MED ORDER — ONDANSETRON HCL 4 MG/2ML IJ SOLN
4.0000 mg | Freq: Three times a day (TID) | INTRAMUSCULAR | Status: DC | PRN
  Filled 2012-10-14: qty 2

## 2012-10-14 MED ORDER — ONDANSETRON HCL 4 MG/2ML IJ SOLN
INTRAMUSCULAR | Status: AC
  Administered 2012-10-14: 4 mg via INTRAVENOUS
  Filled 2012-10-14: qty 2

## 2012-10-14 NOTE — Progress Notes (Signed)
Subjective:  Appreciate GI consult and help. Patient denies any chest pain or shortness of breath. Abdominal pain nausea vomiting and diarrhea markedly improved. Schedule for EGD tomorrow. MAXIMUM TEMPERATURE 102.1 yesterday cultures so far are negative  Objective:  Vital Signs in the last 24 hours: Temp:  [98.7 F (37.1 C)-102.1 F (38.9 C)] 98.7 F (37.1 C) (03/23 2536) Pulse Rate:  [87-91] 87 (03/23 0608) BP: (106-120)/(54-73) 120/73 mmHg (03/23 0608) SpO2:  [98 %-100 %] 100 % (03/23 6440)  Intake/Output from previous day: 03/22 0701 - 03/23 0700 In: 1000 [I.V.:1000] Out: 1650 [Urine:1650] Intake/Output from this shift:    Physical Exam: Neck: no adenopathy, no carotid bruit, no JVD and supple, symmetrical, trachea midline Lungs: clear to auscultation bilaterally Heart: regular rate and rhythm, S1, S2 normal, no murmur, click, rub or gallop Abdomen: Mildly distended and mild generalized tenderness good bowel sounds no guarding Extremities: extremities normal, atraumatic, no cyanosis or edema  Lab Results:  Recent Labs  10/13/12 0400 10/14/12 0620  WBC 7.3 6.8  HGB 10.8* 9.7*  PLT 242 217    Recent Labs  10/13/12 0400 10/14/12 0620  NA 134* 135  K 4.0 3.5  CL 105 105  CO2 20 21  GLUCOSE 179* 171*  BUN 12 7  CREATININE 1.19* 1.05   No results found for this basename: TROPONINI, CK, MB,  in the last 72 hours Hepatic Function Panel  Recent Labs  10/14/12 0620  PROT 5.5*  ALBUMIN 2.4*  AST 40*  ALT 29  ALKPHOS 62  BILITOT 0.3   No results found for this basename: CHOL,  in the last 72 hours No results found for this basename: PROTIME,  in the last 72 hours  Imaging: Imaging results have been reviewed and Ct Abdomen Pelvis W Contrast  10/12/2012  *RADIOLOGY REPORT*  Clinical Data: Mild pain, fever and diarrhea.  CT ABDOMEN AND PELVIS WITH CONTRAST  Technique:  Multidetector CT imaging of the abdomen and pelvis was performed following the standard  protocol during bolus administration of intravenous contrast.  Contrast: 22m OMNIPAQUE IOHEXOL 300 MG/ML  SOLN, 1068mOMNIPAQUE IOHEXOL 300 MG/ML  SOLN  Comparison: CT of the abdomen pelvis 07/02/2012.  Findings:  Lung Bases: Small hiatal hernia.  Otherwise, unremarkable.  Abdomen/Pelvis:  Postoperative changes of distal gastrectomy and gastrojejunostomy are again noted.  The appearance of the liver, gallbladder, spleen, bilateral adrenal glands and bilateral kidneys is unremarkable.  The pancreas is atrophic, but otherwise unremarkable in appearance.  Atherosclerosis throughout the abdominal and pelvic vasculature, without definite aneurysm.  No significant volume of ascites.  No pneumoperitoneum.  There is some borderline dilated and mildly dilated loops of small bowel measuring up to 3.2 cm in diameter, predominantly in the left side of the abdomen.  No definite pathologic lymphadenopathy identified within the abdomen or pelvis.  Assessment of the pelvis is slightly limited by extensive beam hardening artifact from the patient's right-sided total hip arthroplasty.  The uterus and ovaries are unremarkable in appearance.  Urinary bladder is also unremarkable in appearance.  Musculoskeletal: Postoperative changes are noted in the anterior abdominal wall with a small chronic postoperative fluid collection in which measures up to approximately 4.2 x 1.0 cm (image 27 of series 2), favored to represent a chronic postoperative seroma. There are no aggressive appearing lytic or blastic lesions noted in the visualized portions of the skeleton.  IMPRESSION: 1.  Multiple borderline dilated and mildly dilated loops of small bowel.  This may be chronic in this patient,  however, the possibility of an early or partial small bowel obstruction is not entirely excluded.  Clinical correlation is recommended, with consideration for followup with routine abdominal radiographs if the patient should developed worsening symptoms. 2.   Status post distal gastrectomy and gastrojejunostomy. 3.  Small hiatal hernia. 4.  Additional postoperative findings, as above, similar to prior studies. 5.  Small hiatal hernia.   Original Report Authenticated By: Vinnie Langton, M.D.    Acute Abdominal Series  10/13/2012  *RADIOLOGY REPORT*  Clinical Data: Abdominal pain, partial small bowel obstruction  ACUTE ABDOMEN SERIES (ABDOMEN 2 VIEW & CHEST 1 VIEW)  Comparison: CT 10/12/2012  Findings: Right IJ port catheter stable.  Relatively low lung volumes.  Heart size upper limits normal.  No focal infiltrate.  No effusion.  No free air.  The small bowel is relatively decompressed.  There is some residual oral contrast material in the colon.  Multiple fluid levels are noted primarily in the colon on the erect radiograph.  No free air.  Right hip arthroplasty hardware partially seen.  IMPRESSION:  Fluid levels in nondilated colon.  No evidence of obstruction or free air.   Original Report Authenticated By: D. Wallace Going, MD     Cardiac Studies:  Assessment/Plan:  Probable resolving viral gastroenteritis Status post early partial small bowel obstruction status post multiple laparotomies in the past  History of adenocarcinoma stomach status post resection in the past status post chemotherapy and radiation in the past stable  Hypertension  Diabetes mellitus  Morbid obesity  History of bronchial asthma  Plan Continue present management Check cultures tomorrow if negative will DC all antibiotics if okay with GI Schedule for upper endoscopy tomorrow  LOS: 2 days    Eliodoro Gullett N 10/14/2012, 3:26 PM

## 2012-10-14 NOTE — ED Provider Notes (Signed)
Medical screening examination/treatment/procedure(s) were conducted as a shared visit with non-physician practitioner(s) and myself.  I personally evaluated the patient during the encounter  Repeat visit for 2 days of fever, chills, diarrhea, R flank pain.  Uncomfortable with R CVAT. Sick contacts at home. No peritoneal signs.  Ezequiel Essex, MD 10/14/12 1253

## 2012-10-14 NOTE — Progress Notes (Signed)
GASTROENTEROLOGY PROGRESS NOTE  Problem:   Fevers, nausea, diarrhea, history of gastric cancer  Subjective: Had nausea with dry heaves last night, preventing her from eating supper. No nausea at present. No significant abdominal pain, but does have upper abdominal discomfort. One or 2 loose bowel movements over the past 24 hours.  Objective: C. difficile toxin negative. MAXIMUM TEMPERATURE 102.1 since yesterday. White count remains normal, slight decrease in hemoglobin  Assessment: Ongoing upper and lower tract symptoms, with mild improvement since admission. At least she is not vomiting anymore.   Ongoing fevers, without leukocytosis, remain unexplained.   I still think that the best unifying hypothesis is that this illness is due to go a viral gastroenteritis, and that it has nothing to do with her prior history of gastric cancer.  Plan: Endoscopy tomorrow in view of ongoing upper tract symptoms. Patient is desirous of this for reassurance, in view of her past history of gastric cancer about 4 years ago.  Cleotis Nipper, M.D. 10/14/2012 12:49 PM

## 2012-10-15 ENCOUNTER — Encounter (HOSPITAL_COMMUNITY): Payer: Self-pay

## 2012-10-15 ENCOUNTER — Encounter (HOSPITAL_COMMUNITY)
Admission: EM | Disposition: A | Discharge status: Home / Self Care | Payer: Self-pay | Source: Home / Self Care | Attending: Cardiology

## 2012-10-15 DIAGNOSIS — R110 Nausea: Secondary | ICD-10-CM | POA: Diagnosis present

## 2012-10-15 HISTORY — PX: ESOPHAGOGASTRODUODENOSCOPY: SHX5428

## 2012-10-15 LAB — GLUCOSE, CAPILLARY
Glucose-Capillary: 172 mg/dL — ABNORMAL HIGH (ref 70–99)
Glucose-Capillary: 253 mg/dL — ABNORMAL HIGH (ref 70–99)

## 2012-10-15 SURGERY — EGD (ESOPHAGOGASTRODUODENOSCOPY)
Anesthesia: Moderate Sedation

## 2012-10-15 MED ORDER — DIPHENHYDRAMINE HCL 50 MG/ML IJ SOLN
INTRAMUSCULAR | Status: AC
  Filled 2012-10-15: qty 1

## 2012-10-15 MED ORDER — NYSTATIN 100000 UNIT/ML MT SUSP
5.0000 mL | Freq: Four times a day (QID) | OROMUCOSAL | Status: DC
  Administered 2012-10-15 – 2012-10-16 (×4): 500000 [IU] via OROMUCOSAL
  Filled 2012-10-15 (×7): qty 5

## 2012-10-15 MED ORDER — BUTAMBEN-TETRACAINE-BENZOCAINE 2-2-14 % EX AERO
INHALATION_SPRAY | CUTANEOUS | Status: DC | PRN
  Administered 2012-10-15: 1 via TOPICAL

## 2012-10-15 MED ORDER — FENTANYL CITRATE 0.05 MG/ML IJ SOLN
INTRAMUSCULAR | Status: AC
  Filled 2012-10-15: qty 4

## 2012-10-15 MED ORDER — MIDAZOLAM HCL 10 MG/2ML IJ SOLN
INTRAMUSCULAR | Status: DC | PRN
  Administered 2012-10-15: 2.5 mg via INTRAVENOUS

## 2012-10-15 MED ORDER — FENTANYL CITRATE 0.05 MG/ML IJ SOLN
INTRAMUSCULAR | Status: DC | PRN
  Administered 2012-10-15: 25 ug via INTRAVENOUS

## 2012-10-15 MED ORDER — SODIUM CHLORIDE 0.9 % IV SOLN: INTRAVENOUS | Status: DC

## 2012-10-15 MED ORDER — MIDAZOLAM HCL 10 MG/2ML IJ SOLN
INTRAMUSCULAR | Status: AC
  Filled 2012-10-15: qty 4

## 2012-10-15 NOTE — OR Nursing (Signed)
Upon arrival to recovery patients pressure noted to be 70/43.  IVFs increased to 100/min.  Pressure increased to 95/53 at 1137 and to 100/62 at 1143.

## 2012-10-15 NOTE — Progress Notes (Signed)
Subjective:  Patient denies any chest pain shortness of breath or dizziness. Complains of vague abdominal pain. Denies any nausea vomiting or any further diarrhea. Underwent upper endoscopy today noted to have mild Candida esophagitis  Objective:  Vital Signs in the last 24 hours: Temp:  [98.2 F (36.8 C)-99.9 F (37.7 C)] 98.7 F (37.1 C) (03/24 1013) Pulse Rate:  [72-87] 74 (03/24 1120) Resp:  [12-22] 19 (03/24 1145) BP: (70-120)/(36-86) 100/62 mmHg (03/24 1145) SpO2:  [95 %-100 %] 100 % (03/24 1145)  Intake/Output from previous day:   Intake/Output from this shift:    Physical Exam: Neck: no adenopathy, no carotid bruit, no JVD and supple, symmetrical, trachea midline Lungs: clear to auscultation bilaterally Heart: regular rate and rhythm, S1, S2 normal, no murmur, click, rub or gallop Abdomen: Soft mild generalized tenderness no guarding bowel sounds okay Extremities: extremities normal, atraumatic, no cyanosis or edema  Lab Results:  Recent Labs  10/13/12 0400 10/14/12 0620  WBC 7.3 6.8  HGB 10.8* 9.7*  PLT 242 217    Recent Labs  10/13/12 0400 10/14/12 0620  NA 134* 135  K 4.0 3.5  CL 105 105  CO2 20 21  GLUCOSE 179* 171*  BUN 12 7  CREATININE 1.19* 1.05   No results found for this basename: TROPONINI, CK, MB,  in the last 72 hours Hepatic Function Panel  Recent Labs  10/14/12 0620  PROT 5.5*  ALBUMIN 2.4*  AST 40*  ALT 29  ALKPHOS 62  BILITOT 0.3   No results found for this basename: CHOL,  in the last 72 hours No results found for this basename: PROTIME,  in the last 72 hours  Imaging: Imaging results have been reviewed and No results found.  Cardiac Studies:  Assessment/Plan:  Probable resolving viral gastroenteritis  Mild candidia esophagitis Status post early partial small bowel obstruction status post multiple laparotomies in the past  History of adenocarcinoma stomach status post resection in the past status post chemotherapy and  radiation in the past stable  Hypertension  Diabetes mellitus  Morbid obesity  History of bronchial asthma  Plan Continue present management DC Flagyl and Cipro Increase ambulation Will DC home tomorrow if stable and okay from GI point of view  LOS: 3 days    Lenee Franze N 10/15/2012, 12:12 PM

## 2012-10-15 NOTE — Interval H&P Note (Signed)
History and Physical Interval Note:  10/15/2012 10:53 AM  [name]  has presented today for surgery, with the diagnosis of nausea, diarrhea  The various methods of treatment have been discussed with the patient and family. After consideration of risks, benefits and other options for treatment, the patient has consented to  Procedure(s): ESOPHAGOGASTRODUODENOSCOPY (EGD) (N/A) as a surgical intervention .  The patient's history has been reviewed, patient examined, no change in status, stable for surgery.  I have reviewed the patient's chart and labs.  Questions were answered to the patient's satisfaction.     [place] C.

## 2012-10-15 NOTE — Op Note (Signed)
[facility] [place], [address]   ENDOSCOPY PROCEDURE REPORT  PATIENT: [name], [name]  MR#: [id number] BIRTHDATE: July 22, 1951 , 61  yrs. old GENDER: Female  ENDOSCOPIST: Wilford Corner, MD REFERRED [id number] team  PROCEDURE DATE:  10/15/2012 PROCEDURE:   EGD, diagnostic ASA CLASS:   Class III INDICATIONS:Nausea.   Vomiting. MEDICATIONS: Fentanyl 25 mcg IV, Versed-Detailed 2.5 mg IV, and Cetacaine spray x 1  TOPICAL ANESTHETIC:  DESCRIPTION OF PROCEDURE:   After the risks benefits and alternatives of the procedure were thoroughly explained, informed consent was obtained.  The     endoscope was introduced through the mouth and advanced to the second portion of the duodenum , limited by Without limitations.   The instrument was slowly withdrawn as the mucosa was fully examined.     FINDINGS: The endoscope was inserted into the oropharynx and esophagus was intubated.  The gastroesophageal junction was noted to be 42 cm from the incisors.  Endoscope was advanced into the stomach, which revealed a normal postop appearance with distal gastrectomy. Normal appearing gastric remnant and normal appearing gastroduodenal anastomosis. The endoscope was advanced into the duodenum which was unremarkable.  The endoscope was withdrawn back into the stomach and retroflexion revealed normal proximal stomach. Minimal white plaques seen in esophagus and brushing was done. Appearance consistent with esophageal candidiasis.  COMPLICATIONS: None  ENDOSCOPIC IMPRESSION: 1. Candida esophagitis (mild) - doubt source of N/V 2. Normal postop appearance of stomach   RECOMMENDATIONS: Nystatin swish and swallow; Supportive care   REPEAT EXAM: N/A  _______________________________ Wilford Corner, MD eSigned:  Wilford Corner, MD 10/15/2012 11:25 AM    CC:  PATIENT NAME:  Carlis, Burnsworth MR#: [id number]

## 2012-10-15 NOTE — H&P (View-Only) (Signed)
GASTROENTEROLOGY PROGRESS NOTE  Problem:   Fevers, nausea, diarrhea, history of gastric cancer  Subjective: Had nausea with dry heaves last night, preventing her from eating supper. No nausea at present. No significant abdominal pain, but does have upper abdominal discomfort. One or 2 loose bowel movements over the past 24 hours.  Objective: C. difficile toxin negative. MAXIMUM TEMPERATURE 102.1 since yesterday. White count remains normal, slight decrease in hemoglobin  Assessment: Ongoing upper and lower tract symptoms, with mild improvement since admission. At least she is not vomiting anymore.   Ongoing fevers, without leukocytosis, remain unexplained.   I still think that the best unifying hypothesis is that this illness is due to go a viral gastroenteritis, and that it has nothing to do with her prior history of gastric cancer.  Plan: Endoscopy tomorrow in view of ongoing upper tract symptoms. Patient is desirous of this for reassurance, in view of her past history of gastric cancer about 4 years ago.  Cleotis Nipper, M.D. 10/14/2012 12:49 PM

## 2012-10-16 ENCOUNTER — Telehealth (HOSPITAL_COMMUNITY): Payer: Self-pay | Admitting: Emergency Medicine

## 2012-10-16 ENCOUNTER — Encounter (HOSPITAL_COMMUNITY): Payer: Self-pay | Admitting: Gastroenterology

## 2012-10-16 MED ORDER — HEPARIN SOD (PORK) LOCK FLUSH 100 UNIT/ML IV SOLN
INTRAVENOUS | Status: AC
  Administered 2012-10-16: 500 [IU]
  Filled 2012-10-16: qty 5

## 2012-10-16 MED ORDER — NYSTATIN 100000 UNIT/ML MT SUSP: 5.0000 mL | Freq: Four times a day (QID) | OROMUCOSAL | Status: DC

## 2012-10-16 MED ORDER — CARVEDILOL 6.25 MG PO TABS: 6.2500 mg | ORAL_TABLET | Freq: Two times a day (BID) | ORAL | Status: DC

## 2012-10-16 NOTE — ED Notes (Signed)
Call rcvd from [facility] lab.  They have result to call to MD.  Pt was admitted to hospital.  Randell Loop provided Name of MD Dr Terrence Dupont and phone # to contact him w/results.

## 2012-10-16 NOTE — Discharge Summary (Signed)
[name], [name]                  ACCOUNT NO.:  [id number]  MEDICAL RECORD NO.:  [id number]  LOCATION:  34                         FACILITY:  [facility]  PHYSICIAN:  Ariann Khaimov N. Terrence Dupont, M.D. DATE OF BIRTH:  29-Nov-1950  DATE OF ADMISSION:  10/12/2012 DATE OF DISCHARGE:  10/16/2012                              DISCHARGE SUMMARY   ADMITTING DIAGNOSES: 1. Abdominal pain, rule out colitis, rule out gastroenteritis, rule     out early small bowel obstruction. 2. History of adenocarcinoma of stomach, status post resection in the     past, status post chemo and radiation in the past, stable. 3. Hypertension. 4. Diabetes mellitus. 5. Morbid obesity. 6. History of bronchial asthma.  DISCHARGE DIAGNOSES: 1. Status post acute gastroenteritis. 2. History of adenocarcinoma of stomach, status post resection in the     past, status post chemo and radiation in the past, stable, status     post upper endoscopy. 3. Hypertension. 4. Diabetes mellitus. 5. Morbid obesity. 6. Anemia of chronic disease. 7. History of bronchial asthma. 8. Candida esophagitis.  DISCHARGE MEDICATIONS:  Nystatin 100,000 units/mL suspension 5 mL by mouth 4 times daily, carvedilol 6.25 mg 1 tablet twice daily, albuterol inhaler every 6 hours as before, Nexium 40 mg 1 capsule twice daily as before, fish oil 1000 mg capsule daily as before, Prozac 20 mg 1 capsule daily as before, Amaryl 4 mg 1 tablet daily as before, Norco 1 tablet every 4 hours as needed for pain, losartan 50 mg 1 tab daily, singular 10 mg 1 tab daily, Janumet 50/1000 mg 1 tablet daily, tramadol 50 mg every 8 hours as needed for pain.  The patient has been advised to stop triamterene and hydrochlorothiazide.  DIET:  Low salt, low cholesterol.  Advance diet as tolerated.  FOLLOWUP:  Follow up with me in 1 week.  Follow up with Dr. Darrick Grinder as scheduled.  Follow up with GI and Oncology as scheduled.  CONDITION AT DISCHARGE:  Stable.  BRIEF HISTORY AND  HOSPITAL COURSE:  Mr. Bernath is a 62 year old female with past medical history significant for adenocarcinoma of the stomach, status post distal gastrectomy and gastrojejunostomy in the past, status post chemo and radiation in the past, non-insulin-dependent diabetes mellitus, hypertension, history of bronchial asthma, morbid obesity. She came to the ER complaining of vague abdominal pain associated with nausea, vomiting, diarrhea approximately 3-4 times per day.  She came to the ER yesterday and was discharged and came back again with similar complaints associated with high-grade fever and chills.  Patient was noted to be tachycardic and hypotensive, received fluid challenge with improvement in her blood pressure and was started on IV Cipro and Flagyl.  Patient presently states she feels better.  No more vomiting today.  CT of the abdomen showed questionable early small bowel obstruction.  Patient denies any chest pain, shortness of breath. Denies any palpitation, lightheadedness, or syncope.  Denies history of PND, orthopnea, or leg swelling.  Denies any urinary complaints.  PAST MEDICAL HISTORY:  As above.  PAST SURGICAL HISTORY:  She had colon surgery in the past.  Had total knee arthroplasty in the past and total hip arthroplasty in the  right side.  Had left shoulder surgery.  Had appendectomy.  Had C-section and also umbilical hernia repair.  PHYSICAL EXAMINATION:  GENERAL:  She was alert, awake, oriented x3. VITAL SIGNS:  Blood pressure was 99/61, pulse was 89, after fluid challenge, her temp was 102.1 which came down to 99.2. EYES:  Conjunctivae was pink. NECK:  Supple.  No JVD.  No bruit. LUNGS:  Clear to auscultation without rhonchi or rales. CARDIOVASCULAR:  S1, S2 was normal.  There was no murmur or gallop. ABDOMEN:  Soft.  Mild generalized tenderness.  Bowel sounds were present.  There was no guarding or rebound tenderness. EXTREMITIES:  There is no clubbing, cyanosis, or  edema.  LABORATORY DATA:  Sodium was 133, potassium 4.0, glucose 209, BUN 14, creatinine 1.14.  Hemoglobin was 10.5, hematocrit 31.1, white count of 7.2.  Stool for C diff toxins were negative.  Blood cultures are negative so far.  Urine culture grew multiple species.  Chest x-ray, EKG showed normal sinus rhythm with no acute ischemic changes.  CT of the abdomen and pelvis showed multiple borderline dilated and mildly dilated loops of small bowel.  This may be chronic in this patient, however the possibility of early or partial small-bowel obstruction is not entirely excluded.  Repeat obstructive series of the abdomen was negative for obstruction.  Stool cultures are negative so far.  BRIEF HOSPITAL COURSE:  The patient was admitted to telemetry unit. Pancultures were obtained.  GI consultation was obtained.  The patient was empirically started on Cipro and Flagyl which was discontinued.  The patient remained afebrile for last 24 hours.  The patient underwent upper endoscopy, which showed some questionable evidence of Candida esophagitis and was started on nystatin.  The patient is tolerating p.o. okay.  Her nausea, vomiting, and diarrhea has completely resolved and is afebrile for last 24 hours.  The patient will be discharged home and will be followed up in my office in 1 week and Dr. Darrick Grinder and Oncology and GI as outpatient as scheduled.     Allegra Lai. Terrence Dupont, M.D.     MNH/MEDQ  D:  10/16/2012  T:  10/16/2012  Job:  [id number]

## 2012-10-16 NOTE — Progress Notes (Signed)
Inpatient Diabetes Program Recommendations  AACE/ADA: New Consensus Statement on Inpatient Glycemic Control (2013)  Target Ranges:  Prepandial:   less than 140 mg/dL      Peak postprandial:   less than 180 mg/dL (1-2 hours)      Critically ill patients:  140 - 180 mg/dL   Reason for Visit: Hyperglycemia  Results for [name], [name] (MRN [id number]) as of 10/16/2012 09:41  Ref. Range 10/15/2012 07:14 10/15/2012 12:40 10/15/2012 16:42 10/15/2012 21:09 10/16/2012 07:19  Glucose-Capillary Latest Range: 70-99 mg/dL 180 (H) 172 (H) 278 (H) 253 (H) 172 (H)    Inpatient Diabetes Program Recommendations Correction (SSI): Increase Novolog to moderate tidwc Oral Agents: Restart Amaryl and Janumet at discharge HgbA1C: 8.9% - ? accuracy with H/H low Diet: When diet advanced, begin CHO mod med  Note: Will need to f/u with PCP for management of diabetes.  Thank you. Lorenda Peck, RD, LDN, CDE Inpatient Diabetes Coordinator [phone]

## 2012-10-16 NOTE — Discharge Summary (Signed)
  Discharge summary dictated on 325 one 4 dictation number is [phone]

## 2012-10-16 NOTE — Progress Notes (Signed)
Pt ready for d/c. Went over d/c instructions with pt. Instructed on change in BP medications. No change in pt condition since AM assessment. Port flushed with 67m of NS, then 548mheparin flush, deaccessed, WNL, covered with a bandaid. Pt d/c'd to home with daughter. SaNoreene LarssonN

## 2012-10-16 NOTE — Progress Notes (Signed)
Patient ID: [name], female   DOB: 09-May-1951, 62 y.o.   MRN: [id number]  Doing well. Eating well. F/U with me prn. Patient being discharged today. Should complete Nystatin course.

## 2012-10-19 LAB — CULTURE, BLOOD (ROUTINE X 2): Culture: NO GROWTH

## 2012-11-09 LAB — STOOL CULTURE

## 2012-11-30 ENCOUNTER — Ambulatory Visit (HOSPITAL_BASED_OUTPATIENT_CLINIC_OR_DEPARTMENT_OTHER): Payer: Medicare Other | Admitting: Hematology & Oncology

## 2012-11-30 ENCOUNTER — Ambulatory Visit: Payer: PRIVATE HEALTH INSURANCE

## 2012-11-30 ENCOUNTER — Other Ambulatory Visit (HOSPITAL_BASED_OUTPATIENT_CLINIC_OR_DEPARTMENT_OTHER): Payer: Medicare Other | Admitting: Lab

## 2012-11-30 VITALS — BP 126/75 | HR 81 | Temp 98.1°F | Resp 16 | Ht 67.0 in | Wt 268.0 lb

## 2012-11-30 DIAGNOSIS — C165 Malignant neoplasm of lesser curvature of stomach, unspecified: Secondary | ICD-10-CM

## 2012-11-30 DIAGNOSIS — E119 Type 2 diabetes mellitus without complications: Secondary | ICD-10-CM

## 2012-11-30 DIAGNOSIS — D509 Iron deficiency anemia, unspecified: Secondary | ICD-10-CM

## 2012-11-30 DIAGNOSIS — C169 Malignant neoplasm of stomach, unspecified: Secondary | ICD-10-CM

## 2012-11-30 LAB — LIPID PANEL
Cholesterol: 193 mg/dL (ref 0–200)
Total CHOL/HDL Ratio: 4.6 Ratio

## 2012-11-30 LAB — FERRITIN: Ferritin: 157 ng/mL (ref 10–291)

## 2012-11-30 LAB — CBC WITH DIFFERENTIAL (CANCER CENTER ONLY)
BASO%: 0.4 % (ref 0.0–2.0)
LYMPH%: 38.3 % (ref 14.0–48.0)
MCH: 28.8 pg (ref 26.0–34.0)
MCV: 87 fL (ref 81–101)
MONO%: 9.8 % (ref 0.0–13.0)
Platelets: 331 10*3/uL (ref 145–400)
RDW: 14.7 % (ref 11.1–15.7)

## 2012-11-30 LAB — COMPREHENSIVE METABOLIC PANEL
ALT: 10 U/L (ref 0–35)
Alkaline Phosphatase: 66 U/L (ref 39–117)
Glucose, Bld: 147 mg/dL — ABNORMAL HIGH (ref 70–99)
Sodium: 141 mEq/L (ref 135–145)
Total Bilirubin: 0.4 mg/dL (ref 0.3–1.2)
Total Protein: 7.1 g/dL (ref 6.0–8.3)

## 2012-11-30 LAB — HEMOGLOBIN A1C: Mean Plasma Glucose: 197 mg/dL — ABNORMAL HIGH (ref <117)

## 2012-11-30 LAB — IRON AND TIBC
%SAT: 20 % (ref 20–55)
UIBC: 241 ug/dL (ref 125–400)

## 2012-11-30 MED ORDER — HEPARIN SOD (PORK) LOCK FLUSH 100 UNIT/ML IV SOLN
500.0000 [IU] | Freq: Once | INTRAVENOUS | Status: AC | PRN
  Administered 2012-11-30: 500 [IU] via INTRAVENOUS
  Filled 2012-11-30: qty 5

## 2012-11-30 MED ORDER — SODIUM CHLORIDE 0.9 % IJ SOLN
10.0000 mL | INTRAMUSCULAR | Status: DC | PRN
  Administered 2012-11-30: 10 mL via INTRAVENOUS
  Filled 2012-11-30: qty 10

## 2012-11-30 NOTE — Progress Notes (Signed)
This office note has been dictated.

## 2012-11-30 NOTE — Patient Instructions (Signed)

## 2012-12-01 NOTE — Progress Notes (Signed)
CC:   Ardyth Gal. Spruill, M.D.  DIAGNOSES: 1. 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 2. Insulin-dependent diabetes. 3. Recent salmonella gastroenteritis.  CURRENT THERAPY:  Observation.  INTERIM HISTORY:  [name] comes in for followup.  She, unfortunately, was hospitalized back in March with salmonella poisoning.  She thinks she got this from eating out a lot.  She was diagnosed by stool cultures.  She did have an upper endoscopy while she was in the hospital. Everything looked fine with respect to stomach cancer.  She is in remission from her stomach cancer.  This, I do not feel is going to be a problem for her.  She does have diabetes.  Hopefully, this has not been an issue for her.  Her blood sugars actually have been doing fairly well.  She is not having any problems with diarrhea right now.  Again, she had the stool cultures that showed her to have salmonella.  Again, she is feeling better right now.  She has lost a little bit of weight.  Her CT scan that was done did not show any evidence of recurrent malignancy.  She had some surgical changes.  There were some borderline dilated small bowel loops.  PHYSICAL EXAMINATION:  General:  This is an obese black female in no obvious distress.  Vital signs:  Temperature of 98.1, pulse 81, respiratory rate 16, blood pressure 126/75.  Weight is 268.  Head and neck:  Normocephalic, atraumatic skull.  There are no ocular or oral lesions.  There are no palpable cervical or supraclavicular lymph nodes. Lungs:  Clear bilaterally.  Cardiac:  Regular rate and rhythm with a normal S1 and S2.  There are no murmurs, rubs, or bruits.  Abdomen: Soft with good bowel sounds.  There is no palpable abdominal mass.  She has a well-healed laparotomy scar.  There is no abdominal mass.  There is no palpable hepatosplenomegaly.  Extremities:  No clubbing, cyanosis, or edema.  SKIN:  No rashes, ecchymoses, or petechiae.  LABORATORY  STUDIES:  White cell count is 7.2, hemoglobin 12.6, hematocrit 38.1, platelet count 331.  IMPRESSION:  [name] is a very nice 62 year old African American female with a history of stage IB adenocarcinoma of the stomach.  She underwent partial gastrectomy.  She had adjuvant chemo and radiation therapy.  She is now out, I think, over 5 years.  I forgot to mention that we did give her some IV iron back in December. I think this has helped with respect to her blood count.  From my point of view, we will plan to get her back in another 3 or 4 months.  I just do not see recurrent stomach cancer as being a problem.  I really believe that she is cured from this.  Her blood sugars are probably much more of a problem for her long-term.  Addendum:  I forgot to mention that she completed all of her treatments back in November 2010.  She had 2 positive lymph nodes.    ______________________________ Volanda Napoleon, M.D. PRE/MEDQ  D:  11/30/2012  T:  12/01/2012  Job:  [id number]

## 2012-12-03 ENCOUNTER — Encounter: Payer: Self-pay | Admitting: *Deleted

## 2013-01-11 ENCOUNTER — Ambulatory Visit (HOSPITAL_BASED_OUTPATIENT_CLINIC_OR_DEPARTMENT_OTHER): Payer: Medicare Other

## 2013-01-11 VITALS — BP 120/74 | HR 80 | Temp 97.9°F | Resp 16

## 2013-01-11 DIAGNOSIS — C169 Malignant neoplasm of stomach, unspecified: Secondary | ICD-10-CM

## 2013-01-11 DIAGNOSIS — Z452 Encounter for adjustment and management of vascular access device: Secondary | ICD-10-CM

## 2013-01-11 DIAGNOSIS — C165 Malignant neoplasm of lesser curvature of stomach, unspecified: Secondary | ICD-10-CM

## 2013-01-11 MED ORDER — SODIUM CHLORIDE 0.9 % IJ SOLN
10.0000 mL | INTRAMUSCULAR | Status: DC | PRN
  Administered 2013-01-11: 10 mL via INTRAVENOUS
  Filled 2013-01-11: qty 10

## 2013-01-11 MED ORDER — HEPARIN SOD (PORK) LOCK FLUSH 100 UNIT/ML IV SOLN
500.0000 [IU] | Freq: Once | INTRAVENOUS | Status: AC
  Administered 2013-01-11: 500 [IU] via INTRAVENOUS
  Filled 2013-01-11: qty 5

## 2013-01-11 NOTE — Patient Instructions (Signed)
Call MD for problems or concerns

## 2013-02-14 ENCOUNTER — Telehealth: Payer: Self-pay | Admitting: *Deleted

## 2013-02-14 NOTE — Telephone Encounter (Signed)
Patient called stating that her stools have been black and dark for the past week.  Shared with Dr. Marin Olp.  Dr. Marin Olp said for patient to go to the emergency room. Patient talked to [name] and he told patient that Pepto Bismol can do that to stools.  He told patient that she needs to call him on Monday to update him on the situation.

## 2013-02-15 IMAGING — CR DG CHEST 2V
2 series · 2 of 2 positions shown · non-contrast
Comparison: 03/01/2011.

CLINICAL DATA: Productive cough and chest pain.

CHEST - 2 VIEW

[w chest pa]
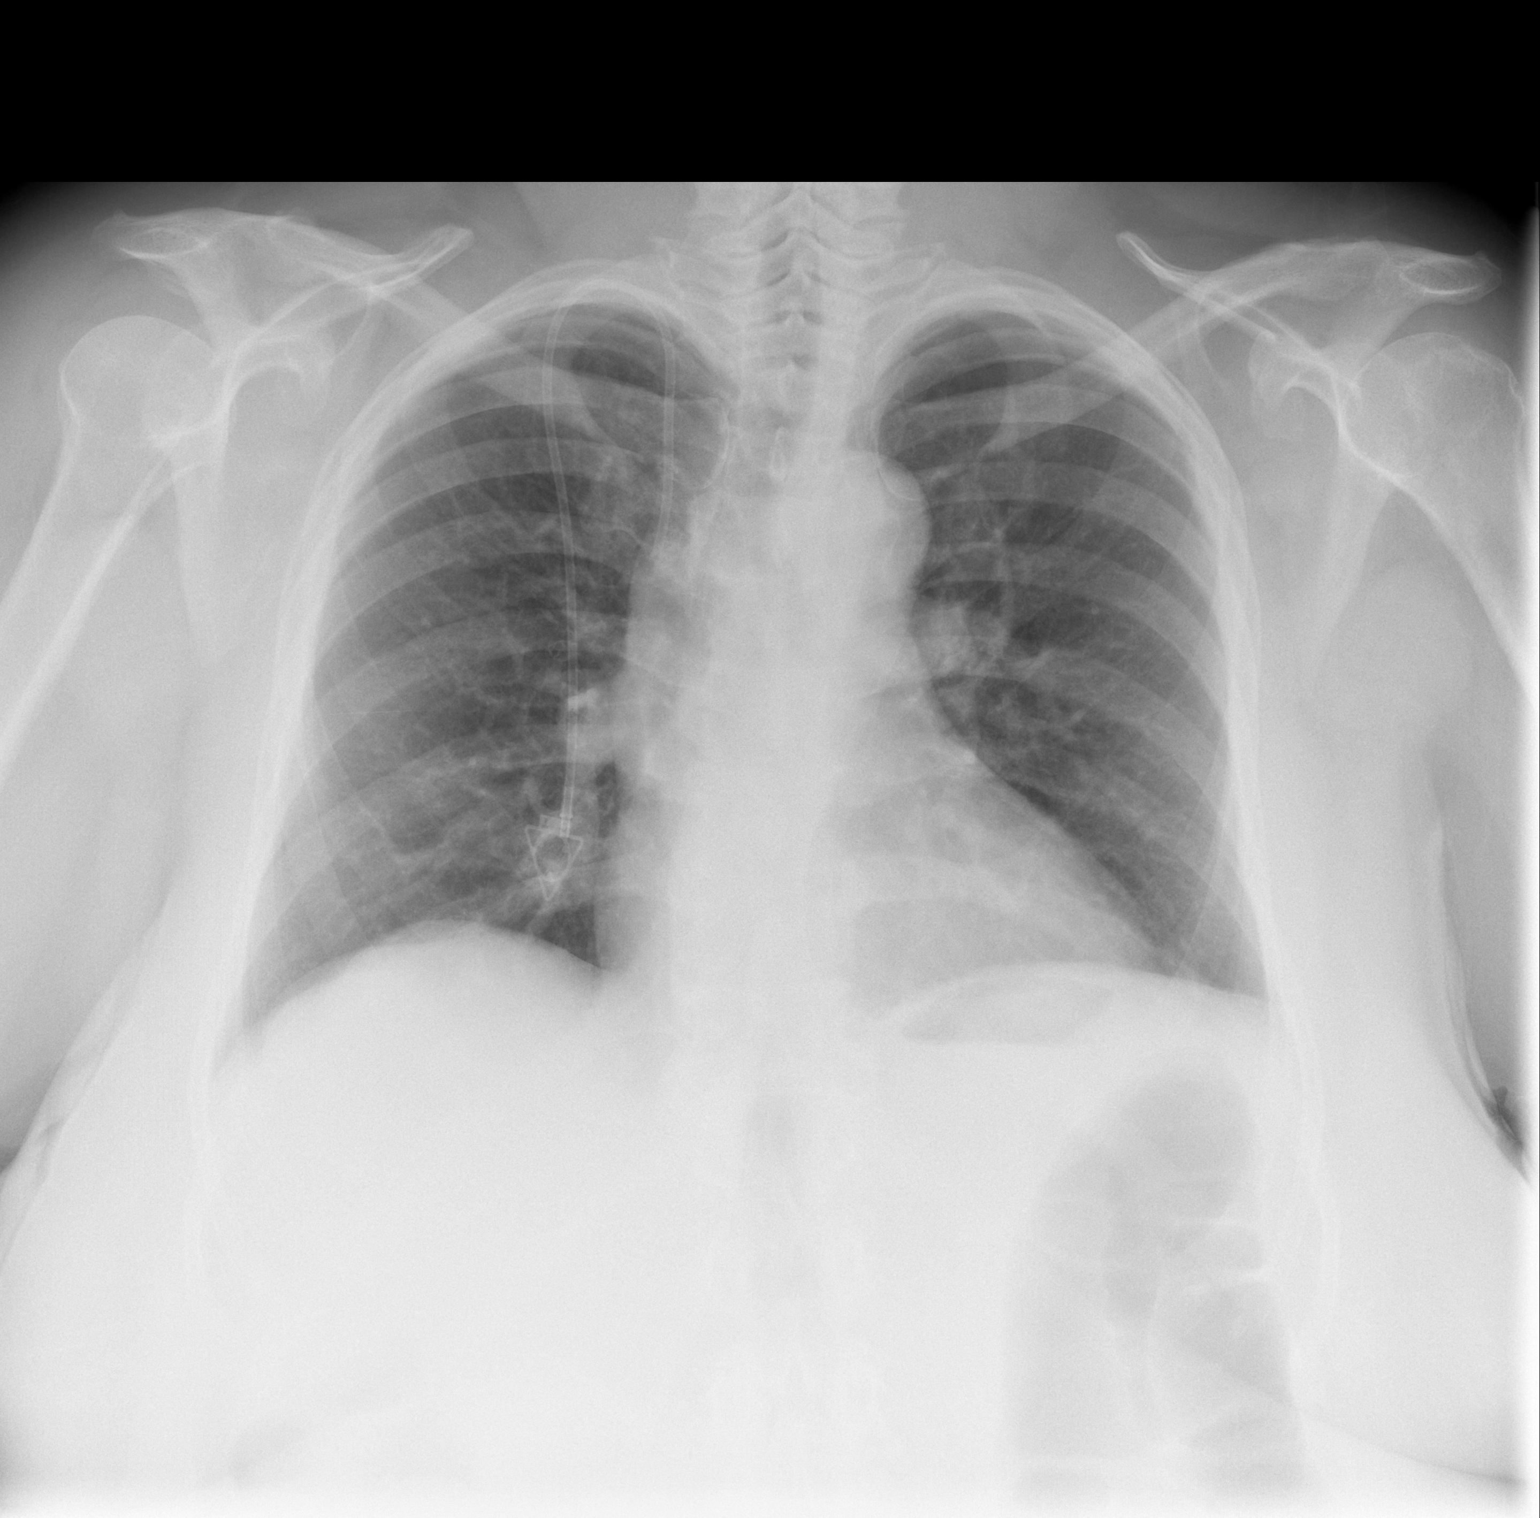

[w chest lat]
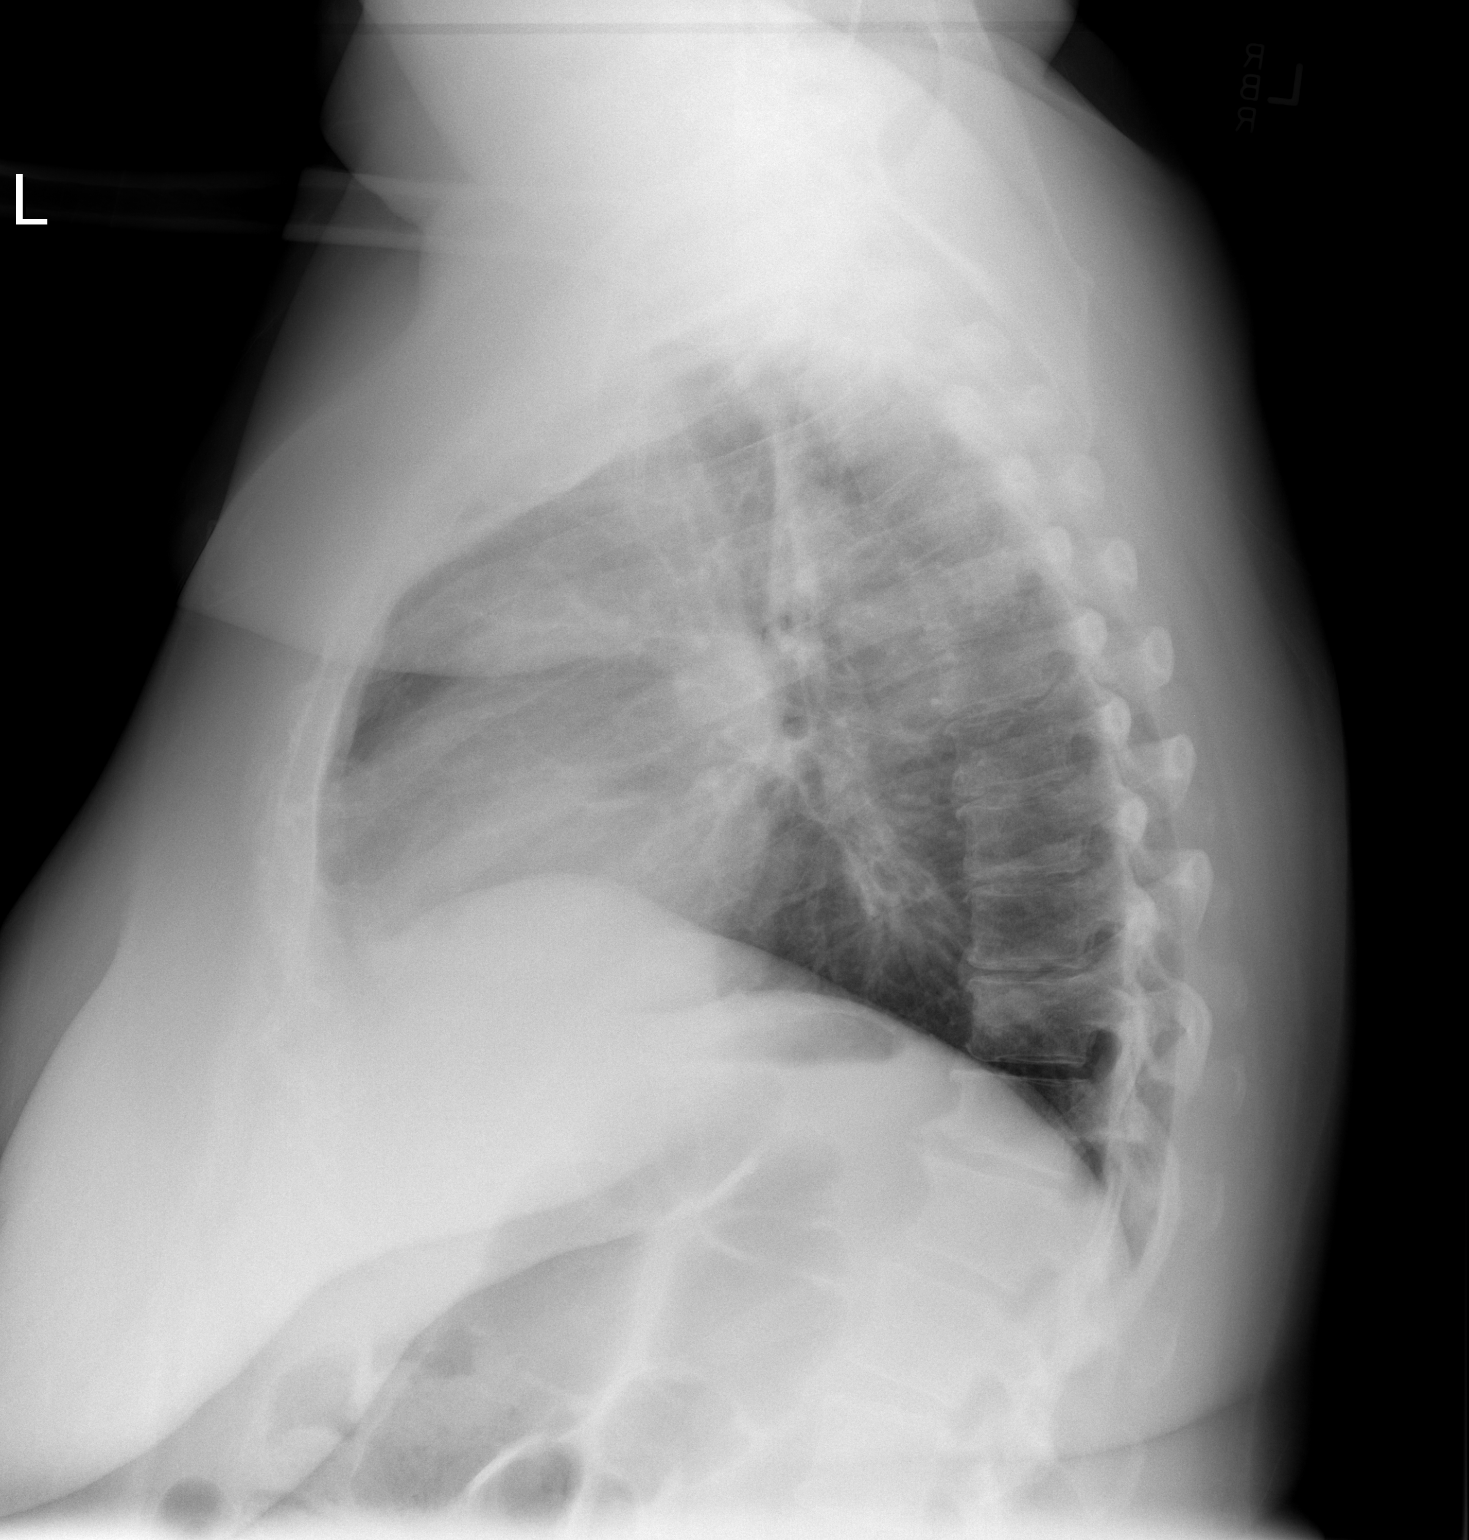

[2 of 2 positions shown; findings below may reference images not displayed]

FINDINGS: The cardiac silhouette, mediastinal and hilar contours
are stable.  The power port is stable.  The lungs are clear.  No
pleural effusion.  The bony thorax is intact.
IMPRESSION: No acute cardiopulmonary findings.

## 2013-02-19 ENCOUNTER — Other Ambulatory Visit: Payer: Self-pay | Admitting: *Deleted

## 2013-02-19 MED ORDER — ESOMEPRAZOLE MAGNESIUM 40 MG PO CPDR: 40.0000 mg | DELAYED_RELEASE_CAPSULE | Freq: Two times a day (BID) | ORAL | Status: DC

## 2013-03-01 ENCOUNTER — Ambulatory Visit (HOSPITAL_BASED_OUTPATIENT_CLINIC_OR_DEPARTMENT_OTHER): Payer: Medicare Other

## 2013-03-01 ENCOUNTER — Other Ambulatory Visit (HOSPITAL_BASED_OUTPATIENT_CLINIC_OR_DEPARTMENT_OTHER): Payer: Medicare Other | Admitting: Lab

## 2013-03-01 ENCOUNTER — Ambulatory Visit (HOSPITAL_BASED_OUTPATIENT_CLINIC_OR_DEPARTMENT_OTHER): Payer: Medicare Other | Admitting: Hematology & Oncology

## 2013-03-01 VITALS — BP 104/63 | HR 79 | Temp 98.3°F | Resp 16 | Ht 67.0 in | Wt 266.0 lb

## 2013-03-01 DIAGNOSIS — C165 Malignant neoplasm of lesser curvature of stomach, unspecified: Secondary | ICD-10-CM

## 2013-03-01 DIAGNOSIS — E119 Type 2 diabetes mellitus without complications: Secondary | ICD-10-CM

## 2013-03-01 DIAGNOSIS — C169 Malignant neoplasm of stomach, unspecified: Secondary | ICD-10-CM

## 2013-03-01 DIAGNOSIS — D509 Iron deficiency anemia, unspecified: Secondary | ICD-10-CM

## 2013-03-01 DIAGNOSIS — D510 Vitamin B12 deficiency anemia due to intrinsic factor deficiency: Secondary | ICD-10-CM

## 2013-03-01 DIAGNOSIS — E109 Type 1 diabetes mellitus without complications: Secondary | ICD-10-CM

## 2013-03-01 DIAGNOSIS — D649 Anemia, unspecified: Secondary | ICD-10-CM

## 2013-03-01 LAB — CMP (CANCER CENTER ONLY)
Albumin: 3.5 g/dL (ref 3.3–5.5)
Alkaline Phosphatase: 75 U/L (ref 26–84)
BUN, Bld: 13 mg/dL (ref 7–22)
Glucose, Bld: 161 mg/dL — ABNORMAL HIGH (ref 73–118)
Total Bilirubin: 0.6 mg/dl (ref 0.20–1.60)

## 2013-03-01 LAB — CBC WITH DIFFERENTIAL (CANCER CENTER ONLY)
BASO#: 0 10*3/uL (ref 0.0–0.2)
Eosinophils Absolute: 0.6 10*3/uL — ABNORMAL HIGH (ref 0.0–0.5)
HCT: 37.6 % (ref 34.8–46.6)
HGB: 12.5 g/dL (ref 11.6–15.9)
LYMPH#: 2.5 10*3/uL (ref 0.9–3.3)
MCHC: 33.2 g/dL (ref 32.0–36.0)
MONO#: 0.6 10*3/uL (ref 0.1–0.9)
NEUT%: 46.7 % (ref 39.6–80.0)
RBC: 4.33 10*6/uL (ref 3.70–5.32)

## 2013-03-01 LAB — IRON AND TIBC CHCC
Iron: 65 ug/dL (ref 41–142)
TIBC: 276 ug/dL (ref 236–444)

## 2013-03-01 LAB — VITAMIN B12: Vitamin B-12: 237 pg/mL (ref 211–911)

## 2013-03-01 MED ORDER — HEPARIN SOD (PORK) LOCK FLUSH 100 UNIT/ML IV SOLN
500.0000 [IU] | Freq: Once | INTRAVENOUS | Status: AC | PRN
  Administered 2013-03-01: 500 [IU] via INTRAVENOUS
  Filled 2013-03-01: qty 5

## 2013-03-01 MED ORDER — SODIUM CHLORIDE 0.9 % IJ SOLN
10.0000 mL | INTRAMUSCULAR | Status: DC | PRN
  Administered 2013-03-01: 10 mL via INTRAVENOUS
  Filled 2013-03-01: qty 10

## 2013-03-01 NOTE — Patient Instructions (Addendum)
Implanted Port Instructions  An implanted port is a central line that has a round shape and is placed under the skin. It is used for long-term IV (intravenous) access for:  · Medicine.  · Fluids.  · Liquid nutrition, such as TPN (total parenteral nutrition).  · Blood samples.  Ports can be placed:  · In the chest area just below the collarbone (this is the most common place.)  · In the arms.  · In the belly (abdomen) area.  · In the legs.  PARTS OF THE PORT  A port has 2 main parts:  · The reservoir. The reservoir is round, disc-shaped, and will be a small, raised area under your skin.  · The reservoir is the part where a needle is inserted (accessed) to either give medicines or to draw blood.  · The catheter. The catheter is a long, slender tube that extends from the reservoir. The catheter is placed into a large vein.  · Medicine that is inserted into the reservoir goes into the catheter and then into the vein.  INSERTION OF THE PORT  · The port is surgically placed in either an operating room or in a procedural area (interventional radiology).  · Medicine may be given to help you relax during the procedure.  · The skin where the port will be inserted is numbed (local anesthetic).  · 1 or 2 small cuts (incisions) will be made in the skin to insert the port.  · The port can be used after it has been inserted.  INCISION SITE CARE  · The incision site may have small adhesive strips on it. This helps keep the incision site closed. Sometimes, no adhesive strips are placed. Instead of adhesive strips, a special kind of surgical glue is used to keep the incision closed.  · If adhesive strips were placed on the incision sites, do not take them off. They will fall off on their own.  · The incision site may be sore for 1 to 2 days. Pain medicine can help.  · Do not get the incision site wet. Bathe or shower as directed by your caregiver.  · The incision site should heal in 5 to 7 days. A small scar may form after the  incision has healed.  ACCESSING THE PORT  Special steps must be taken to access the port:  · Before the port is accessed, a numbing cream can be placed on the skin. This helps numb the skin over the port site.  · A sterile technique is used to access the port.  · The port is accessed with a needle. Only "non-coring" port needles should be used to access the port. Once the port is accessed, a blood return should be checked. This helps ensure the port is in the vein and is not clogged (clotted).  · If your caregiver believes your port should remain accessed, a clear (transparent) bandage will be placed over the needle site. The bandage and needle will need to be changed every week or as directed by your caregiver.  · Keep the bandage covering the needle clean and dry. Do not get it wet. Follow your caregiver's instructions on how to take a shower or bath when the port is accessed.  · If your port does not need to stay accessed, no bandage is needed over the port.  FLUSHING THE PORT  Flushing the port keeps it from getting clogged. How often the port is flushed depends on:  · If a   constant infusion is running. If a constant infusion is running, the port may not need to be flushed.  · If intermittent medicines are given.  · If the port is not being used.  For intermittent medicines:  · The port will need to be flushed:  · After medicines have been given.  · After blood has been drawn.  · As part of routine maintenance.  · A port is normally flushed with:  · Normal saline.  · Heparin.  · Follow your caregiver's advice on how often, how much, and the type of flush to use on your port.  IMPORTANT PORT INFORMATION  · Tell your caregiver if you are allergic to heparin.  · After your port is placed, you will get a manufacturer's information card. The card has information about your port. Keep this card with you at all times.  · There are many types of ports available. Know what kind of port you have.  · In case of an  emergency, it may be helpful to wear a medical alert bracelet. This can help alert health care workers that you have a port.  · The port can stay in for as long as your caregiver believes it is necessary.  · When it is time for the port to come out, surgery will be done to remove it. The surgery will be similar to how the port was put in.  · If you are in the hospital or clinic:  · Your port will be taken care of and flushed by a nurse.  · If you are at home:  · A home health care nurse may give medicines and take care of the port.  · You or a family member can get special training and directions for giving medicine and taking care of the port at home.  SEEK IMMEDIATE MEDICAL CARE IF:   · Your port does not flush or you are unable to get a blood return.  · New drainage or pus is coming from the incision.  · A bad smell is coming from the incision site.  · You develop swelling or increased redness at the incision site.  · You develop increased swelling or pain at the port site.  · You develop swelling or pain in the surrounding skin near the port.  · You have an oral temperature above 102° F (38.9° C), not controlled by medicine.  MAKE SURE YOU:   · Understand these instructions.  · Will watch your condition.  · Will get help right away if you are not doing well or get worse.  Document Released: 07/11/2005 Document Revised: 10/03/2011 Document Reviewed: 10/02/2008  Exit[employer]® Patient Information ©2014 [employer].

## 2013-03-01 NOTE — Progress Notes (Signed)
This office note has been dictated.

## 2013-03-02 NOTE — Progress Notes (Signed)
CC:   Ardyth Gal. Spruill, M.D.  DIAGNOSES: 1. Stage IB (T1 N1 M0) gastric adenocarcinoma. 2. Insulin-dependent diabetes.  CURRENT THERAPY:  Observation.  INTERIM HISTORY:  Ms. Villalona comes in for followup.  We last saw her back in May.  Since then, she went to the emergency room.  She had melena. Thankfully, this was just from her taking Pepto-Bismol.  She does have occasional stomach discomfort.  She is trying to watch her blood sugars.  She is trying to watch what she eats.  She is off the insulin right now.  Her last scans were done back in March.  A CT of the abdomen and pelvis at that point in time did not show any evidence of recurrent malignancy.  Again, her blood sugars will be her biggest issue long term.  PHYSICAL EXAM:  General:  This is a mildly obese African American female in no obvious distress.  Vital Signs:  Show a temperature of 98.3, pulse 79, respiratory rate 16, blood pressure 104/63.  Weight is 266.  Head and Neck:  Show a normocephalic, atraumatic skull.  There are no ocular or oral lesions.  There are no palpable cervical or supraclavicular lymph nodes.  Lungs:  Clear bilaterally.  Cardiac:  Regular rate and rhythm with a normal S1, S2.  There are no murmurs, rubs, or bruits. Abdomen:  Soft.  She has good bowel sounds.  There is no fluid wave. There is no palpable hepatosplenomegaly.  She has a well-healed laparotomy scar.  Extremities:  Show no clubbing, cyanosis, or edema. Neurological:  Shows no focal neurological deficit.  LABORATORY STUDIES:  White cell count is 6.9.  Hemoglobin 12.5, hematocrit 37.6, platelet count 316.  IMPRESSION:  Ms. Lobue is a 62 year old African female with stage IB adenocarcinoma of the stomach.  She underwent a partial gastrectomy.  We gave her adjuvant chemotherapy and radiation therapy.  She is out now by, I think, 4 or 5 years.  I do not see any indication for doing studies.  I see no clinical signs of recurrence.  We  will plan to get her back in 3 more months.  We will flush her Port-A-Cath today and in 6 weeks.    ______________________________ Volanda Napoleon, M.D. PRE/MEDQ  D:  03/01/2013  T:  03/02/2013  Job:  [id number]

## 2013-03-04 ENCOUNTER — Telehealth: Payer: Self-pay | Admitting: Oncology

## 2013-03-04 NOTE — Telephone Encounter (Addendum)
Message copied by Cottie Banda on Mon Mar 04, 2013  4:18 PM ------      Message from: Volanda Napoleon      Created: Fri Mar 01, 2013  6:22 PM       Call - iron is ok!!  Laurey Arrow ------Left message on voicemail.

## 2013-03-15 LAB — HM PAP SMEAR

## 2013-03-21 ENCOUNTER — Telehealth: Payer: Self-pay | Admitting: Hematology & Oncology

## 2013-03-21 NOTE — Telephone Encounter (Signed)
[employer] APPROVED 60 of tablets/capsules/patches/ml per month of Nexium through July 24, 2013.   COPY SCANNED

## 2013-04-03 IMAGING — CT CT CHEST W/ CM
4 series · 12 of 46 positions shown, 17 images · IV contrast (APPLIED)
Comparison: CT 03/04/2011

CT CHEST

CLINICAL DATA: Epigastric pain.  History of gastric carcinoma.
Prior surgery chemotherapy, and radiation therapy.

CT CHEST, ABDOMEN AND PELVIS WITH CONTRAST
TECHNIQUE: Multidetector CT imaging of the chest, abdomen and
pelvis was performed following the standard protocol during bolus
administration of intravenous contrast.
Contrast: 64mL OMNIPAQUE IOHEXOL 300 MG/ML  SOLN,

[Series 2: chest/abd/pel 5.0 b31f · axial · 0.82mm/px · z∈[-408,-328]mm · 2 of 123 slices shown]
[im 11/123  soft-tissue]
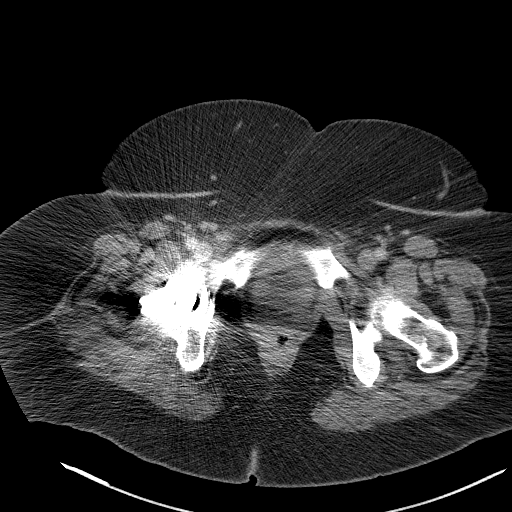
[im 27/123  soft-tissue]
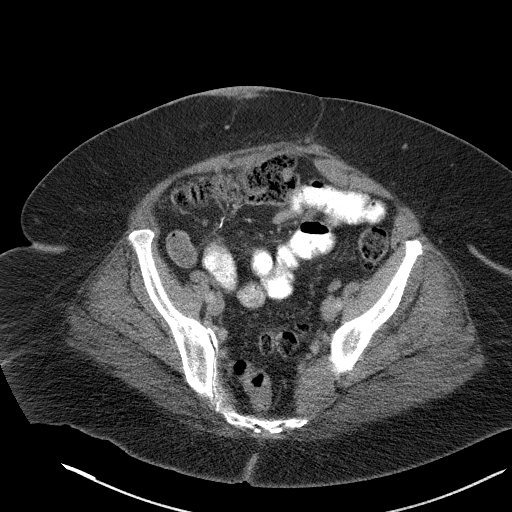

[Series 5: chest/abd/pel 3.0 coronal · coronal · 0.82mm/px · 3 of 109 slices shown]
[im 37/109  soft-tissue]
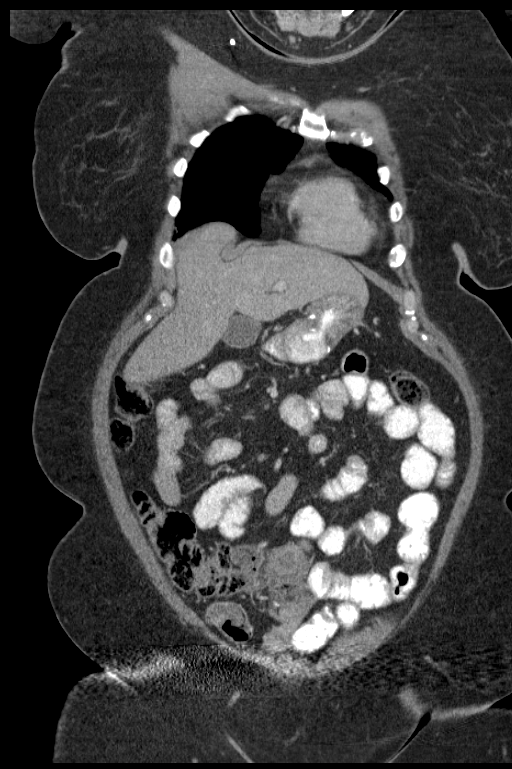
[im 49/109  soft-tissue]
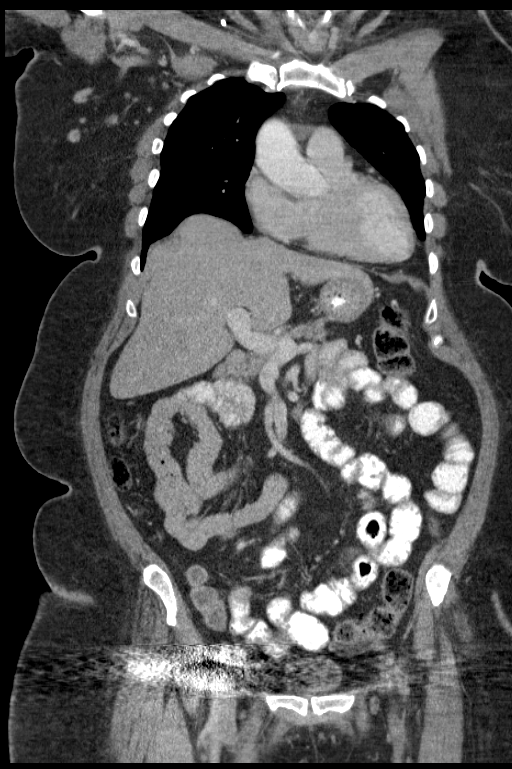
[im 61/109  soft-tissue]
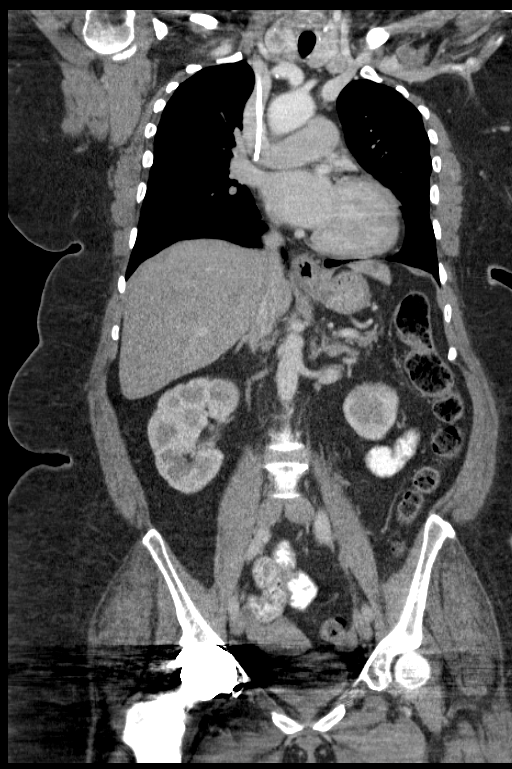

[Series 6: chest/abd/pel 3.0 sagittal · sagittal · 0.77mm/px · 1 of 145 slices shown]
[im 49/145  soft-tissue]
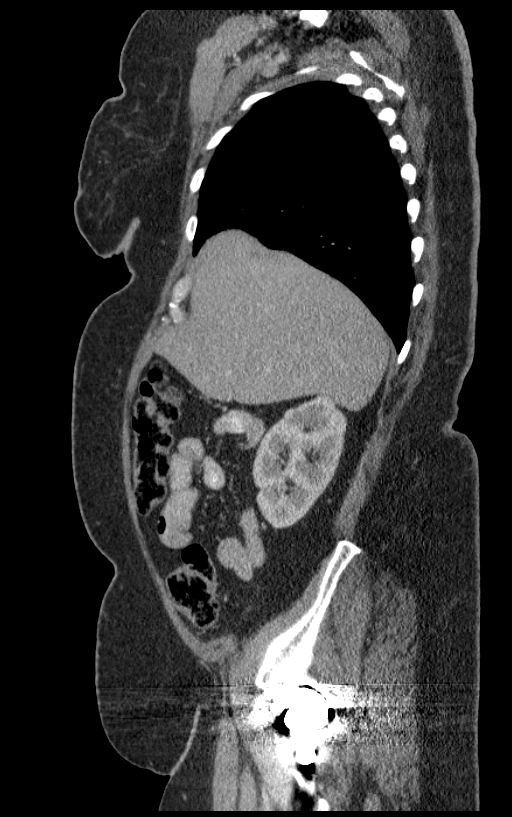

[Series 7: renal delay 5.0 b30f · axial · delayed · 0.84mm/px · z∈[-248,-98]mm · 6 of 44 slices shown, 11 images]
[im 7/44  soft-tissue]
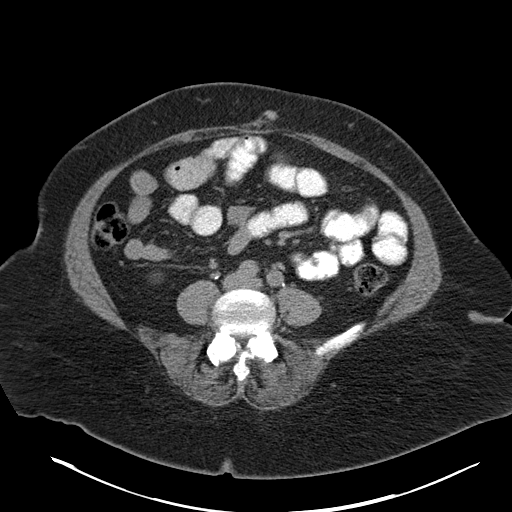
[im 7/44  bone]
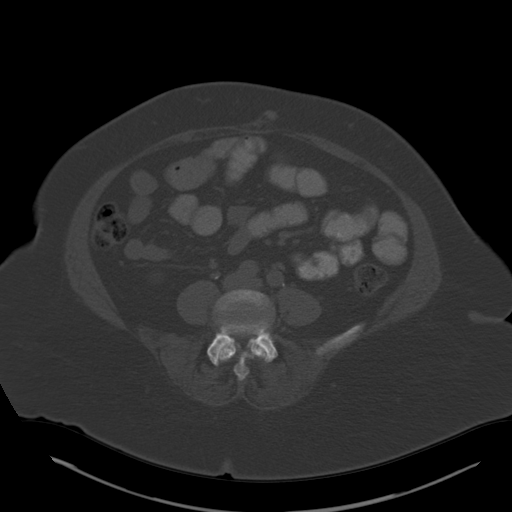
[im 13/44  soft-tissue]
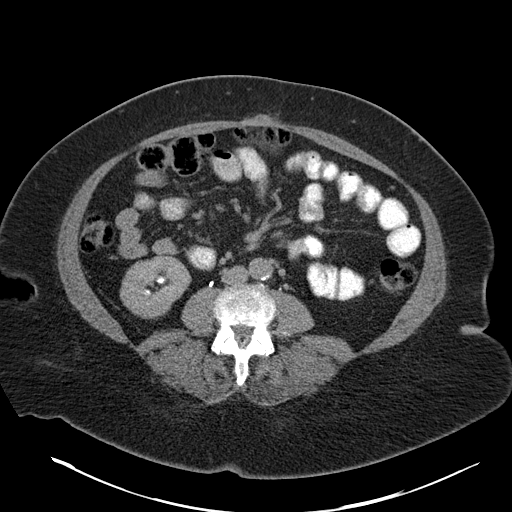
[im 19/44  soft-tissue]
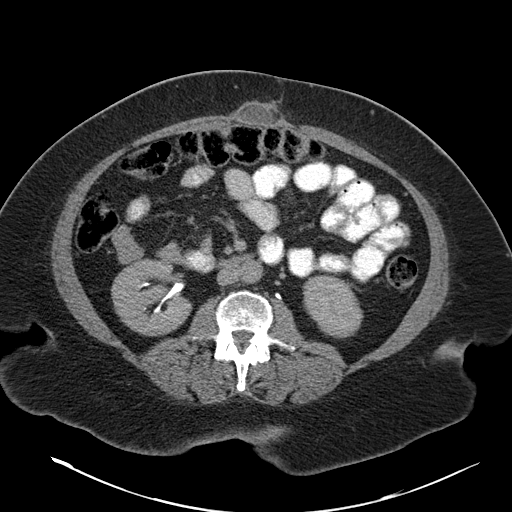
[im 19/44  lung]
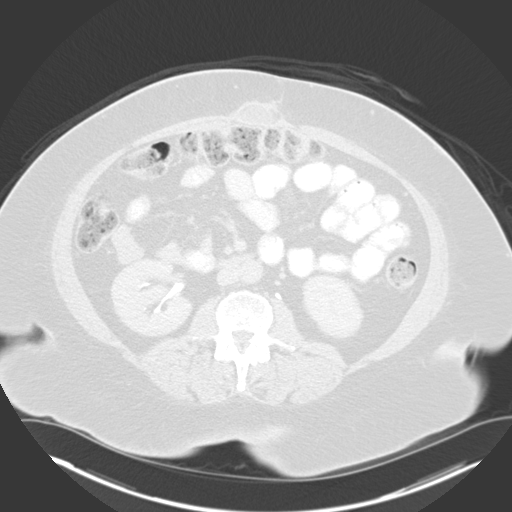
[im 25/44  soft-tissue]
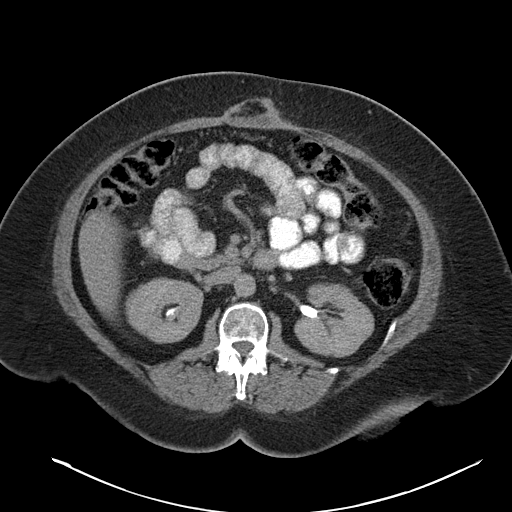
[im 25/44  lung]
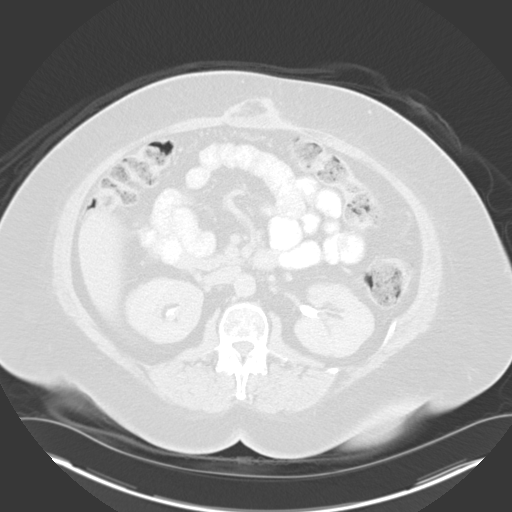
[im 31/44  soft-tissue]
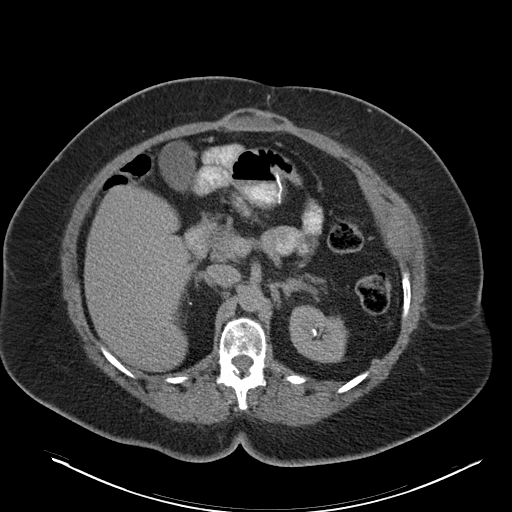
[im 31/44  lung]
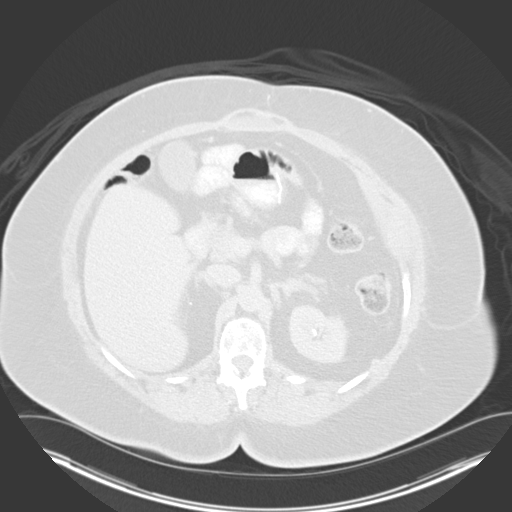
[im 37/44  soft-tissue]
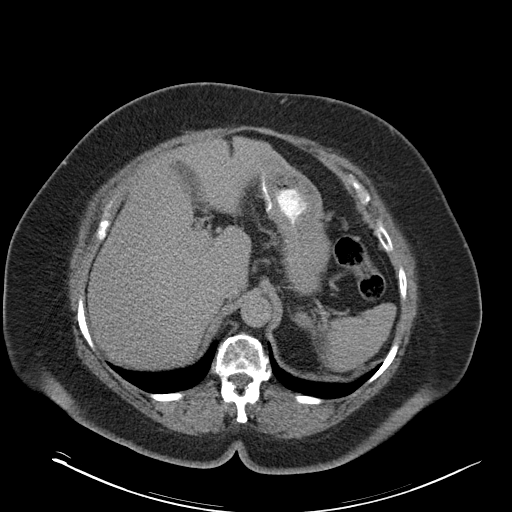
[im 37/44  lung]
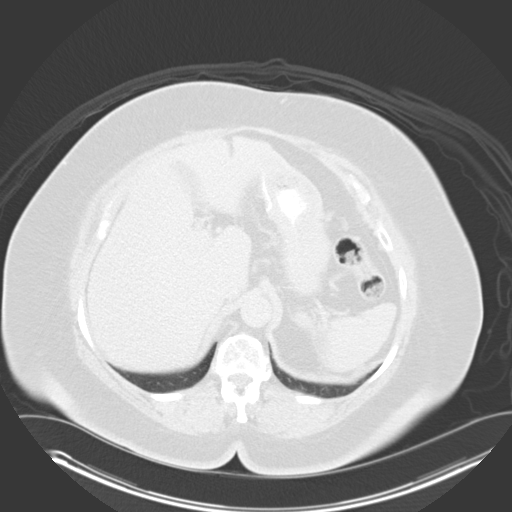

[12 of 46 positions shown; findings below may reference images not displayed]

FINDINGS: No axillary or supraclavicular lymphadenopathy. Port in
the right chest wall.  No mediastinal or hilar lymphadenopathy.  No
pericardial fluid.  Review of the lung parenchyma demonstrates no
suspicious pulmonary nodules.
IMPRESSION: No evidence of thoracic metastasis.

CT ABDOMEN AND PELVIS
FINDINGS: Postsurgical change in the gastric antrum.  No evidence
of nodularity or obstruction. Small hiatal hernia present.  Small
bowel and colon are normal.  There are diverticula of the  sigmoid
colon.  There is a small fluid filled ventral hernia which is
unchanged from prior.

Liver is normal. Focal fatty infiltration described on prior exam
is not readily evident on today's scan.  The gallbladder, pancreas,
spleen, adrenal glands, kidneys are normal.

Abdominal aorta normal caliber.  No retroperitoneal or periportal
lymphadenopathy.

No peritoneal disease.

No free fluid the pelvis.  The uterus and bladder are normal.  No
pelvic lymphadenopathy.  Right hip prosthetic noted.  No aggressive
osseous lesions.  General changes of the spine.
IMPRESSION: 1.  No evidence of gastric carcinoma recurrence.

2.  Stable postoperative change of the gastric antrum.
3.  Small hiatal hernia.

## 2013-04-22 NOTE — Progress Notes (Signed)
This encounter was created in error - please disregard.

## 2013-05-30 ENCOUNTER — Other Ambulatory Visit: Payer: Self-pay | Admitting: *Deleted

## 2013-05-30 ENCOUNTER — Other Ambulatory Visit: Payer: Self-pay

## 2013-05-30 DIAGNOSIS — C169 Malignant neoplasm of stomach, unspecified: Secondary | ICD-10-CM

## 2013-05-30 DIAGNOSIS — D509 Iron deficiency anemia, unspecified: Secondary | ICD-10-CM

## 2013-05-31 ENCOUNTER — Ambulatory Visit (HOSPITAL_BASED_OUTPATIENT_CLINIC_OR_DEPARTMENT_OTHER): Payer: Medicare Other | Admitting: Hematology & Oncology

## 2013-05-31 ENCOUNTER — Ambulatory Visit: Payer: Medicare Other

## 2013-05-31 ENCOUNTER — Other Ambulatory Visit (HOSPITAL_BASED_OUTPATIENT_CLINIC_OR_DEPARTMENT_OTHER): Payer: Medicare Other | Admitting: Lab

## 2013-05-31 ENCOUNTER — Ambulatory Visit (HOSPITAL_BASED_OUTPATIENT_CLINIC_OR_DEPARTMENT_OTHER)
Admission: RE | Admit: 2013-05-31 | Discharge: 2013-05-31 | Disposition: A | Discharge status: Home / Self Care | Payer: Medicare Other | Source: Ambulatory Visit | Attending: Hematology & Oncology | Admitting: Hematology & Oncology

## 2013-05-31 VITALS — BP 134/80 | HR 92 | Temp 98.3°F | Resp 14 | Ht 67.0 in | Wt 269.0 lb

## 2013-05-31 DIAGNOSIS — R1012 Left upper quadrant pain: Secondary | ICD-10-CM

## 2013-05-31 DIAGNOSIS — R110 Nausea: Secondary | ICD-10-CM | POA: Insufficient documentation

## 2013-05-31 DIAGNOSIS — E119 Type 2 diabetes mellitus without complications: Secondary | ICD-10-CM

## 2013-05-31 DIAGNOSIS — C169 Malignant neoplasm of stomach, unspecified: Secondary | ICD-10-CM

## 2013-05-31 DIAGNOSIS — Z85028 Personal history of other malignant neoplasm of stomach: Secondary | ICD-10-CM

## 2013-05-31 DIAGNOSIS — H6092 Unspecified otitis externa, left ear: Secondary | ICD-10-CM

## 2013-05-31 DIAGNOSIS — D509 Iron deficiency anemia, unspecified: Secondary | ICD-10-CM

## 2013-05-31 LAB — CBC WITH DIFFERENTIAL (CANCER CENTER ONLY)
BASO#: 0 10*3/uL (ref 0.0–0.2)
BASO%: 0.4 % (ref 0.0–2.0)
Eosinophils Absolute: 0.4 10*3/uL (ref 0.0–0.5)
HGB: 12.2 g/dL (ref 11.6–15.9)
LYMPH%: 17.1 % (ref 14.0–48.0)
MCH: 28.6 pg (ref 26.0–34.0)
MCV: 86 fL (ref 81–101)
MONO#: 0.7 10*3/uL (ref 0.1–0.9)
MONO%: 9.5 % (ref 0.0–13.0)
NEUT#: 4.8 10*3/uL (ref 1.5–6.5)
RBC: 4.27 10*6/uL (ref 3.70–5.32)
RDW: 14.4 % (ref 11.1–15.7)
WBC: 7.2 10*3/uL (ref 3.9–10.0)

## 2013-05-31 LAB — COMPREHENSIVE METABOLIC PANEL
Albumin: 3.7 g/dL (ref 3.5–5.2)
Alkaline Phosphatase: 99 U/L (ref 39–117)
BUN: 21 mg/dL (ref 6–23)
CO2: 23 mEq/L (ref 19–32)
Glucose, Bld: 193 mg/dL — ABNORMAL HIGH (ref 70–99)
Potassium: 4.7 mEq/L (ref 3.5–5.3)
Sodium: 138 mEq/L (ref 135–145)
Total Protein: 7.2 g/dL (ref 6.0–8.3)

## 2013-05-31 LAB — FERRITIN CHCC: Ferritin: 146 ng/ml (ref 9–269)

## 2013-05-31 LAB — IRON AND TIBC CHCC
Iron: 54 ug/dL (ref 41–142)
TIBC: 280 ug/dL (ref 236–444)
UIBC: 226 ug/dL (ref 120–384)

## 2013-05-31 LAB — HEMOGLOBIN A1C
Hgb A1c MFr Bld: 9.2 % — ABNORMAL HIGH (ref <5.7)
Mean Plasma Glucose: 217 mg/dL — ABNORMAL HIGH (ref <117)

## 2013-05-31 IMAGING — CT CT HEAD W/O CM
2 series · 16 of 30 positions shown, 20 images · non-contrast
Comparison: None.

CLINICAL DATA: Lightheaded.  Headache and vertigo.  Gastric cancer

CT HEAD WITHOUT CONTRAST
TECHNIQUE: Contiguous axial images were obtained from the base of
the skull through the vertex without contrast.

[Series 3: head bone · axial · 0.49mm/px · z∈[+20,+63]mm · 3 of 28 slices shown]
[im 2/28  bone]
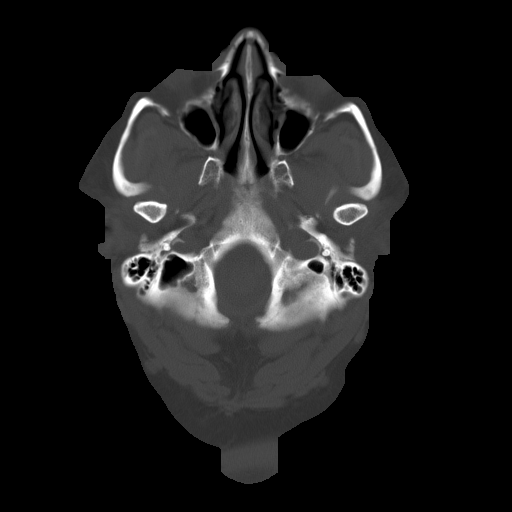
[im 6/28  bone]
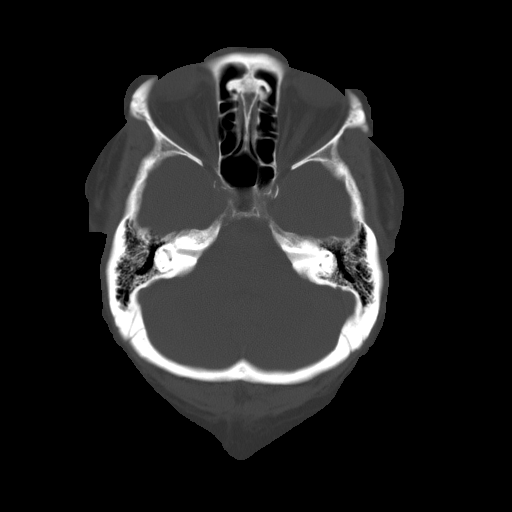
[im 10/28  bone]
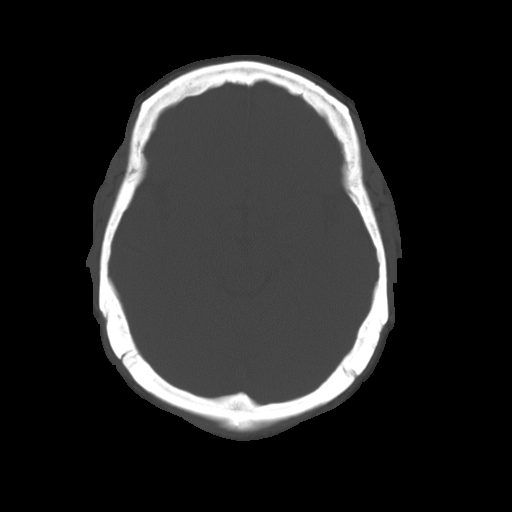

[Series 32: 3d filtered head w/o · axial · non-contrast · 0.49mm/px · z∈[+20,+150]mm · 13 of 28 slices shown, 17 images]
[im 2/28  brain]
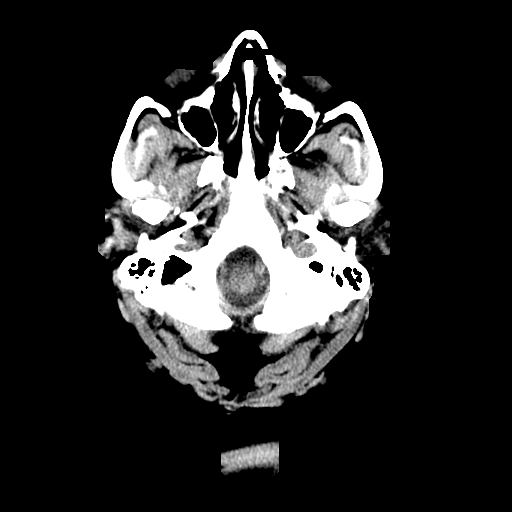
[im 2/28  bone]
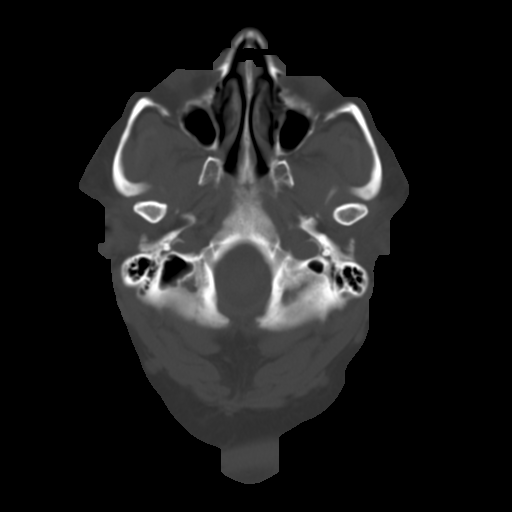
[im 4/28  brain]
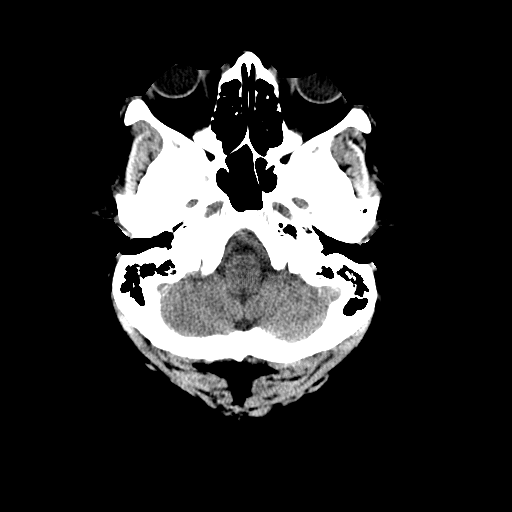
[im 6/28  brain]
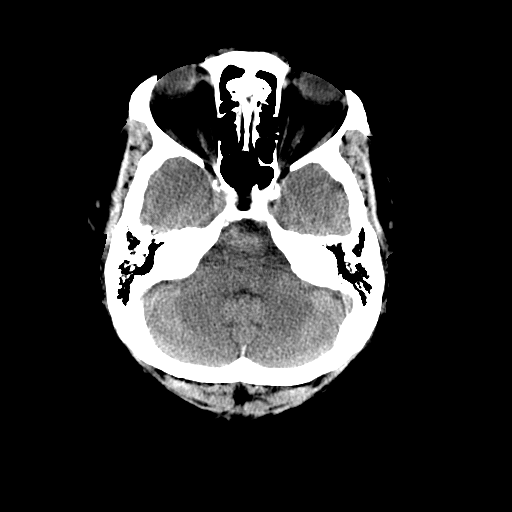
[im 8/28  brain]
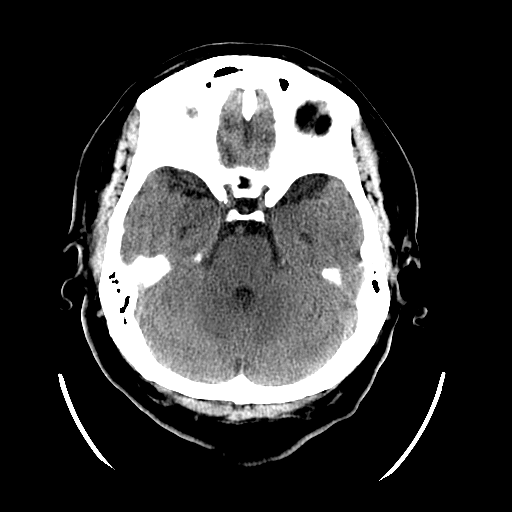
[im 10/28  brain]
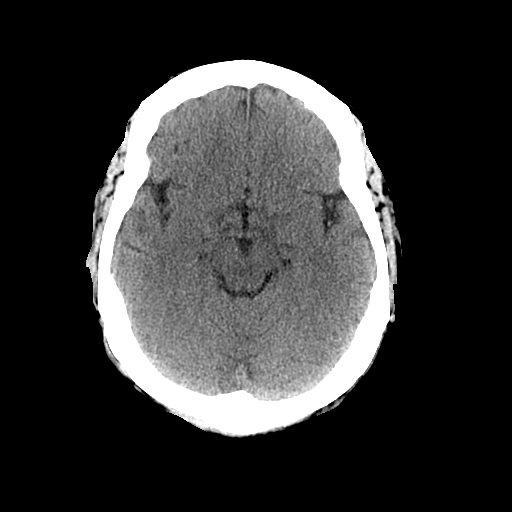
[im 10/28  bone]
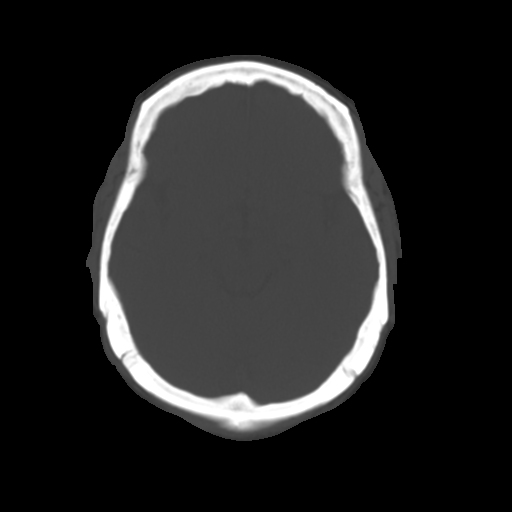
[im 12/28  brain]
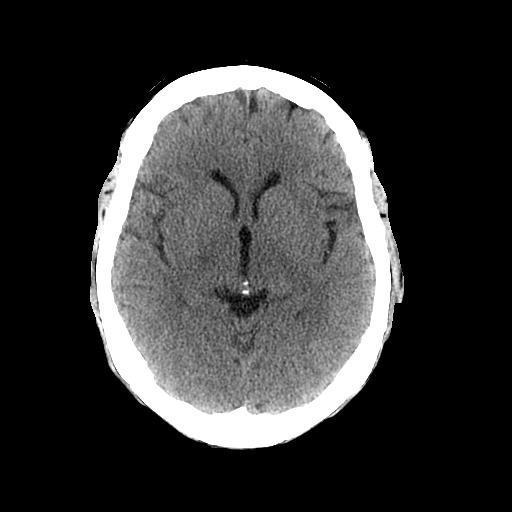
[im 14/28  brain]
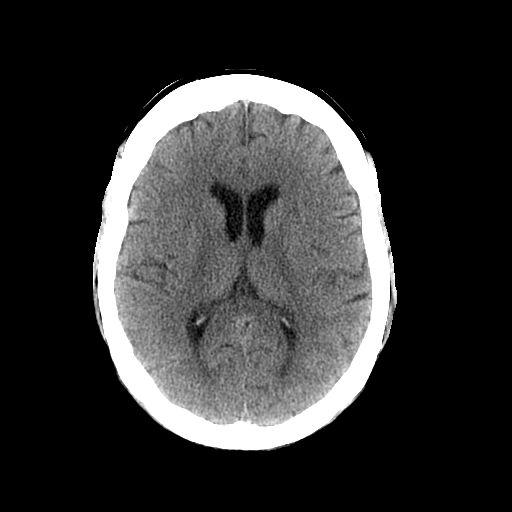
[im 16/28  brain]
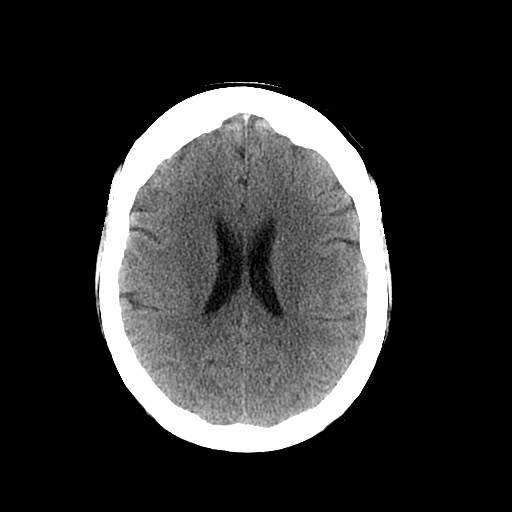
[im 18/28  brain]
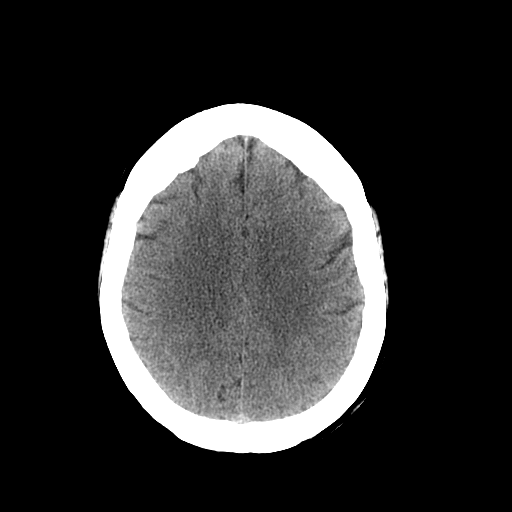
[im 18/28  bone]
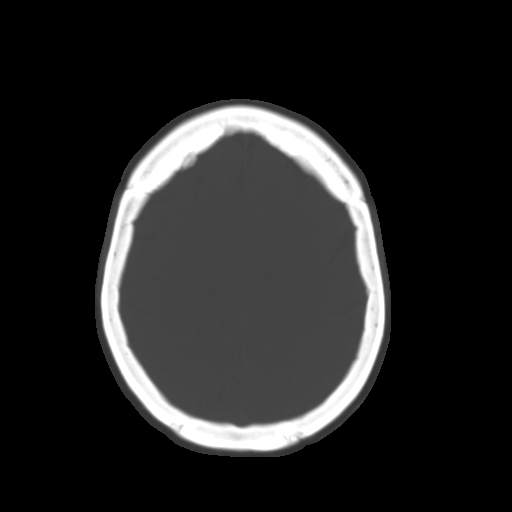
[im 20/28  brain]
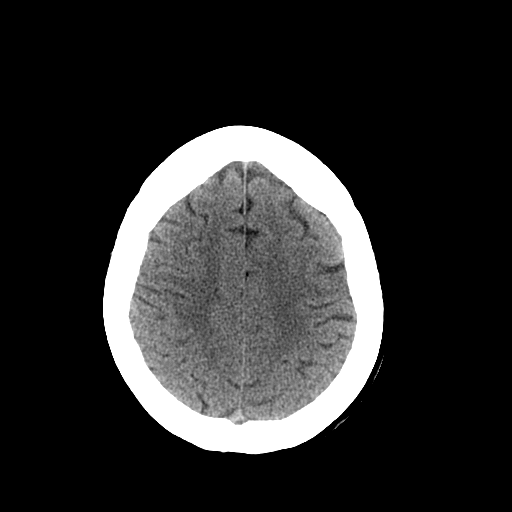
[im 22/28  brain]
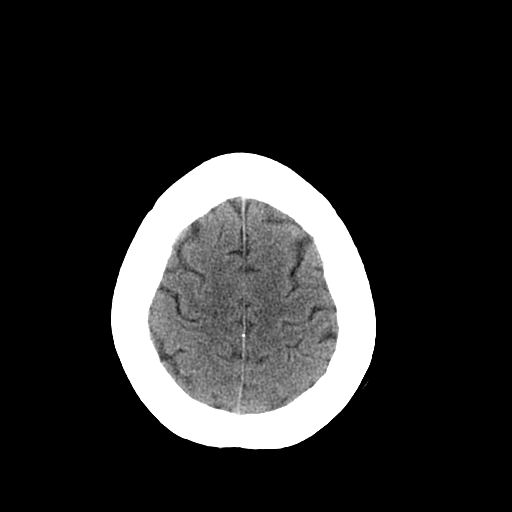
[im 24/28  brain]
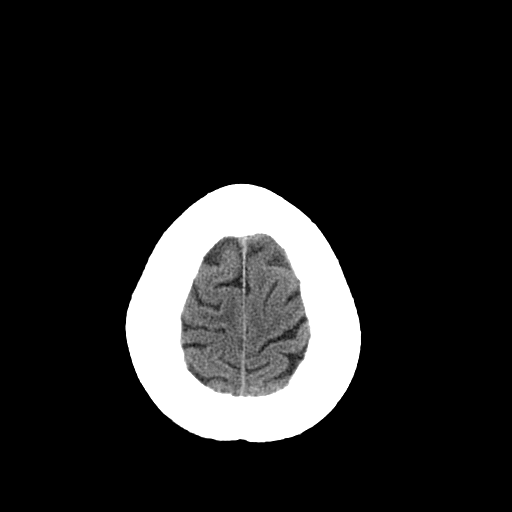
[im 26/28  brain]
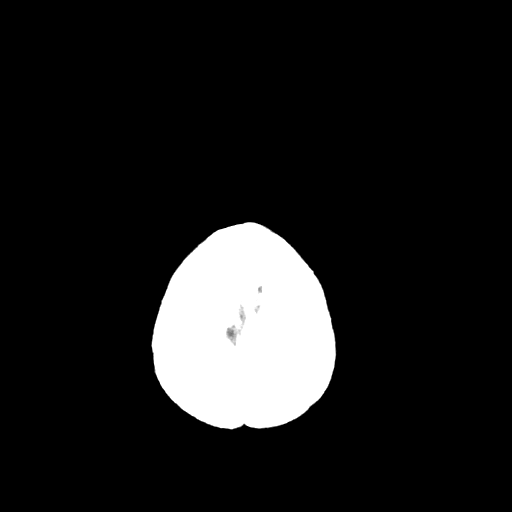
[im 26/28  bone]
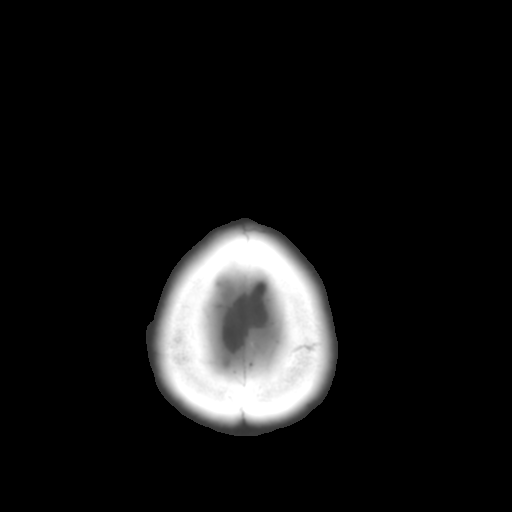

[16 of 30 positions shown; findings below may reference images not displayed]

FINDINGS: Ventricle size is normal.  Mild patchy hypodensity in the
cerebral white matter bilaterally, suggestive of chronic
microvascular ischemia.  No acute infarct.  Negative for hemorrhage
or mass.  Calvarium is intact.
IMPRESSION: Mild chronic changes in the white matter.  No acute abnormality.

## 2013-05-31 MED ORDER — ALTEPLASE 2 MG IJ SOLR
2.0000 mg | Freq: Once | INTRAMUSCULAR | Status: DC | PRN
  Filled 2013-05-31: qty 2

## 2013-05-31 MED ORDER — HEPARIN SOD (PORK) LOCK FLUSH 100 UNIT/ML IV SOLN
500.0000 [IU] | Freq: Once | INTRAVENOUS | Status: AC | PRN
  Administered 2013-05-31: 500 [IU] via INTRAVENOUS
  Filled 2013-05-31: qty 5

## 2013-05-31 MED ORDER — SODIUM CHLORIDE 0.9 % IJ SOLN
10.0000 mL | INTRAMUSCULAR | Status: DC | PRN
  Administered 2013-05-31: 10 mL via INTRAVENOUS
  Filled 2013-05-31: qty 10

## 2013-05-31 MED ORDER — NEOMYCIN-COLIST-HC-THONZONIUM 3.3-3-10-0.5 MG/ML OT SUSP: 3.0000 [drp] | Freq: Three times a day (TID) | OTIC | Status: DC

## 2013-05-31 NOTE — Addendum Note (Signed)
Addended by: Burney Gauze R on: 05/31/2013 02:47 PM   Modules accepted: Orders

## 2013-05-31 NOTE — Progress Notes (Signed)
This office note has been dictated.

## 2013-06-02 NOTE — Progress Notes (Signed)
CC:   [name], M.D.  DIAGNOSES: 1. 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 2. Non-insulin-dependent diabetes.  CURRENT THERAPY:  Observation.  INTERIM HISTORY:  [name] comes in for followup.  We see her every 4-6 months.  She has had no problems with the blood sugars before, she tells me.  I am sure we probably check her hemoglobin A1c here.  The patient is having some abdominal discomfort.  There is more in the left side of the abdomen.  It is just left of the epigastric region.  This has been going on for a few weeks.  We will go ahead and get an abdominal ultrasound on her.  She has had no fever.  There is no dysuria.  There has been no nausea or vomiting.  She has had no cough or shortness of breath.  There has been no leg swelling.  PHYSICAL EXAMINATION:  This is an obese [place] American female, in no obvious distress.  Vital signs:  Temperature of 98.3, pulse 92, respiratory rate 14, blood pressure 134/80.  Weight is 269 pounds.  Head and neck:  She has normocephalic, atraumatic skull.  She has no ocular or oral lesions.  There is no scleral icterus.  She has no mucositis. There is no adenopathy in her neck.  Lungs:  With decreased breath sounds at the bases.  This is chronic.  No wheezes are noted.  Cardiac: Regular rate and rhythm with a normal S1 and S2.  There are no murmurs, rubs, or bruits.  Abdomen:  Soft.  She has well-healed laparotomy scar. There is some tenderness in the left upper quadrant.  I really cannot palpate too deeply because of tenderness.  There is no obvious fluid. There is no obvious splenomegaly.  Extremities:  Show some mild stasis dermatitis changes in the lower legs.  She has good range motion of her joints.  Back:  No tenderness over the spine, ribs, or hips.  Skin: Shows no rashes, ecchymoses, or petechia.  LABORATORY STUDIES:  White cell count is 7.2, hemoglobin 12.2, hematocrit 36.6, platelet count 327.  MCV is  86.  IMPRESSION:  [name] is a very charming 62 year old African American female with history of resected stage IB adenocarcinoma of the stomach. She underwent adjuvant chemo and radiation therapy.  She did well with this.  I think she completed this back in November 2010.  I am not sure what is going on with this abdominal pain.  We will have to see about an ultrasound.  One also has to think about the pancreas with her.  If all looks good, we will plan to get her back in another 4-6 months.  She comes in every 2 months for Port-A-Cath flush.  I spent a good 25 minutes with her today.    ______________________________ Volanda Napoleon, M.D. PRE/MEDQ  D:  05/31/2013  T:  06/01/2013  Job:  [id number]

## 2013-06-04 ENCOUNTER — Telehealth: Payer: Self-pay | Admitting: *Deleted

## 2013-06-04 NOTE — Telephone Encounter (Addendum)
Message copied by Orlando Penner on Tue Jun 04, 2013  4:46 PM ------      Message from: Burney Gauze R      Created: Mon Jun 03, 2013  9:54 PM       Call - [place] is normal.  Hemoglobin A1C is way too high!!  MUST get better control of your blood sugars!!  Pete ------This message left on pt's home answering machine.

## 2013-08-28 ENCOUNTER — Telehealth: Payer: Self-pay | Admitting: Hematology & Oncology

## 2013-08-28 NOTE — Telephone Encounter (Signed)
Pt called thought she had missed appointment. She did miss flush on 1-2. She is aware of 3-6 with MD. She is aware her last flush was 11-7, and that she will try to get a ride and will call back to schedule. I left message on RN line.

## 2013-09-26 ENCOUNTER — Other Ambulatory Visit: Payer: Self-pay | Admitting: *Deleted

## 2013-09-26 DIAGNOSIS — C169 Malignant neoplasm of stomach, unspecified: Secondary | ICD-10-CM

## 2013-09-27 ENCOUNTER — Other Ambulatory Visit (HOSPITAL_BASED_OUTPATIENT_CLINIC_OR_DEPARTMENT_OTHER): Payer: PRIVATE HEALTH INSURANCE | Admitting: Lab

## 2013-09-27 ENCOUNTER — Ambulatory Visit (HOSPITAL_BASED_OUTPATIENT_CLINIC_OR_DEPARTMENT_OTHER): Payer: PRIVATE HEALTH INSURANCE

## 2013-09-27 ENCOUNTER — Encounter: Payer: Self-pay | Admitting: Hematology & Oncology

## 2013-09-27 ENCOUNTER — Ambulatory Visit (HOSPITAL_BASED_OUTPATIENT_CLINIC_OR_DEPARTMENT_OTHER): Payer: PRIVATE HEALTH INSURANCE | Admitting: Hematology & Oncology

## 2013-09-27 VITALS — BP 117/72 | HR 85 | Temp 98.4°F | Resp 85 | Ht 67.0 in | Wt 269.0 lb

## 2013-09-27 DIAGNOSIS — IMO0001 Reserved for inherently not codable concepts without codable children: Secondary | ICD-10-CM

## 2013-09-27 DIAGNOSIS — C50919 Malignant neoplasm of unspecified site of unspecified female breast: Secondary | ICD-10-CM

## 2013-09-27 DIAGNOSIS — D638 Anemia in other chronic diseases classified elsewhere: Secondary | ICD-10-CM

## 2013-09-27 DIAGNOSIS — D509 Iron deficiency anemia, unspecified: Secondary | ICD-10-CM

## 2013-09-27 DIAGNOSIS — C801 Malignant (primary) neoplasm, unspecified: Secondary | ICD-10-CM

## 2013-09-27 DIAGNOSIS — Z85028 Personal history of other malignant neoplasm of stomach: Secondary | ICD-10-CM

## 2013-09-27 DIAGNOSIS — E119 Type 2 diabetes mellitus without complications: Secondary | ICD-10-CM

## 2013-09-27 DIAGNOSIS — C169 Malignant neoplasm of stomach, unspecified: Secondary | ICD-10-CM

## 2013-09-27 DIAGNOSIS — Z794 Long term (current) use of insulin: Principal | ICD-10-CM

## 2013-09-27 LAB — CBC WITH DIFFERENTIAL (CANCER CENTER ONLY)
BASO#: 0 10*3/uL (ref 0.0–0.2)
BASO%: 0.5 % (ref 0.0–2.0)
EOS ABS: 0.5 10*3/uL (ref 0.0–0.5)
EOS%: 6.1 % (ref 0.0–7.0)
HEMATOCRIT: 36.7 % (ref 34.8–46.6)
HEMOGLOBIN: 12.1 g/dL (ref 11.6–15.9)
LYMPH#: 2.5 10*3/uL (ref 0.9–3.3)
LYMPH%: 29.9 % (ref 14.0–48.0)
MCH: 28.2 pg (ref 26.0–34.0)
MCHC: 33 g/dL (ref 32.0–36.0)
MCV: 86 fL (ref 81–101)
MONO#: 0.7 10*3/uL (ref 0.1–0.9)
MONO%: 8.9 % (ref 0.0–13.0)
NEUT#: 4.6 10*3/uL (ref 1.5–6.5)
NEUT%: 54.6 % (ref 39.6–80.0)
Platelets: 319 10*3/uL (ref 145–400)
RBC: 4.29 10*6/uL (ref 3.70–5.32)
RDW: 14.9 % (ref 11.1–15.7)
WBC: 8.4 10*3/uL (ref 3.9–10.0)

## 2013-09-27 LAB — COMPREHENSIVE METABOLIC PANEL
ALK PHOS: 77 U/L (ref 39–117)
ALT: 13 U/L (ref 0–35)
AST: 15 U/L (ref 0–37)
Albumin: 3.8 g/dL (ref 3.5–5.2)
BILIRUBIN TOTAL: 0.3 mg/dL (ref 0.2–1.2)
BUN: 19 mg/dL (ref 6–23)
CO2: 27 mEq/L (ref 19–32)
CREATININE: 1.19 mg/dL — AB (ref 0.50–1.10)
Calcium: 9.7 mg/dL (ref 8.4–10.5)
Chloride: 103 mEq/L (ref 96–112)
Glucose, Bld: 141 mg/dL — ABNORMAL HIGH (ref 70–99)
Potassium: 4.7 mEq/L (ref 3.5–5.3)
Sodium: 139 mEq/L (ref 135–145)
TOTAL PROTEIN: 7.3 g/dL (ref 6.0–8.3)

## 2013-09-27 LAB — IRON AND TIBC CHCC
%SAT: 19 % — AB (ref 21–57)
Iron: 57 ug/dL (ref 41–142)
TIBC: 303 ug/dL (ref 236–444)
UIBC: 245 ug/dL (ref 120–384)

## 2013-09-27 LAB — FERRITIN CHCC: FERRITIN: 128 ng/mL (ref 9–269)

## 2013-09-27 MED ORDER — HEPARIN SOD (PORK) LOCK FLUSH 100 UNIT/ML IV SOLN
500.0000 [IU] | Freq: Once | INTRAVENOUS | Status: AC
  Administered 2013-09-27: 500 [IU] via INTRAVENOUS
  Filled 2013-09-27: qty 5

## 2013-09-27 MED ORDER — SODIUM CHLORIDE 0.9 % IJ SOLN
10.0000 mL | INTRAMUSCULAR | Status: DC | PRN
  Administered 2013-09-27: 10 mL via INTRAVENOUS
  Filled 2013-09-27: qty 10

## 2013-09-27 MED ORDER — PROCHLORPERAZINE MALEATE 10 MG PO TABS: 10.0000 mg | ORAL_TABLET | Freq: Four times a day (QID) | ORAL | Status: DC | PRN

## 2013-09-27 NOTE — Patient Instructions (Signed)

## 2013-09-27 NOTE — Progress Notes (Signed)
[name] presented for Portacath access and flush. Proper placement of portacath confirmed by CXR. Portacath located in the right chest wall accessed with  H 20 needle. Clean, Dry and Intact Good blood return present. Portacath flushed with 75m NS and 500U/585mHeparin per protocol and needle removed intact. Procedure without incident. Patient tolerated procedure well.

## 2013-09-27 NOTE — Progress Notes (Signed)
  DIAGNOSIS:  Stage IB (T1, N1, M0) adenocarcinoma stalk Non-insulin-dependent diabetes   CURRENT THERAPY:  Observation   INTERIM HISTORY:  [name] comes in for followup. Looks like she may be having some gallbladder issues. She saw her family doctor. He's got new work her up for gallbladder disease. She's not sure when tests will be done. She has nausea. This happened after she eats greasy foods. She's had no diarrhea.    Her blood sugars are still on the high side.    She's had no fever. She's had no bleeding. She's had no cough.    She's not sure when her last mammogram was. We have her last mammogram in February of 2013. She needs another one.   PHYSICAL EXAMINATION:  Obese black female in no obvious distress. Vital signs show temperature of 98.4. Pulse 85. Blood pressure 117/72. Weight 169 pounds. Head and neck exam shows no ocular or oral lesions. There is no scleral icterus. There is no adenopathy in the neck. Lungs are clear. Cardiac exam regular rate rhythm. No murmurs. Abdomen soft laparotomy scar. There is no tenderness a sudden maybe in the right upper quadrant. There is no fluid wave. There is no obvious hepatomegaly. Extremities no clubbing cyanosis or edema. Skin exam no rashes.   LABORATORY STUDIES:  White cell count 8.4. Hemoglobin 12. Platelet count 219.    IMPRESSION:  [name] is a 63 year old African American female with past history of gastric cancer. She is now 5 years out from this. I don't see that this will be a problem.    If she i needs gallbladder surgery, again I don't see any issue with this.    We will go ahead and plan to get her back to see [place] in another 3 or 4 months.    We will flush her Port-A-Cath today and previous.    We did check her iron studies.   Jessica Napoleon, MD 09/27/2013

## 2013-09-30 ENCOUNTER — Other Ambulatory Visit: Payer: Self-pay | Admitting: Cardiology

## 2013-09-30 DIAGNOSIS — R109 Unspecified abdominal pain: Secondary | ICD-10-CM

## 2013-10-02 ENCOUNTER — Encounter: Payer: Self-pay | Admitting: *Deleted

## 2013-10-04 ENCOUNTER — Other Ambulatory Visit: Payer: Medicare Other

## 2013-10-08 ENCOUNTER — Ambulatory Visit
Admission: RE | Admit: 2013-10-08 | Discharge: 2013-10-08 | Disposition: A | Discharge status: Home / Self Care | Payer: Medicare Other | Source: Ambulatory Visit | Attending: Cardiology | Admitting: Cardiology

## 2013-10-08 DIAGNOSIS — R109 Unspecified abdominal pain: Secondary | ICD-10-CM

## 2013-10-09 ENCOUNTER — Other Ambulatory Visit: Payer: Self-pay | Admitting: Cardiology

## 2013-10-09 DIAGNOSIS — C169 Malignant neoplasm of stomach, unspecified: Secondary | ICD-10-CM

## 2013-10-14 IMAGING — CT CT ABD-PELV W/ CM
2 of 5 series · 17 of 46 positions shown, 19 images · IV contrast (APPLIED)
Comparison: 12/21/2011

CLINICAL DATA: Abdominal pain, history of gastric cancer status
post partial gastrectomy, prior appendectomy

CT ABDOMEN AND PELVIS WITH CONTRAST
TECHNIQUE: Multidetector CT imaging of the abdomen and pelvis was
performed following the standard protocol during bolus
administration of intravenous contrast.
Contrast: 100mL OMNIPAQUE IOHEXOL 300 MG/ML  SOLN

[Series 2: abd/pelvis 5.0 b31f · axial · 0.79mm/px · z∈[-518,-133]mm · 14 of 87 slices shown, 16 images]
[im 5/87  soft-tissue]
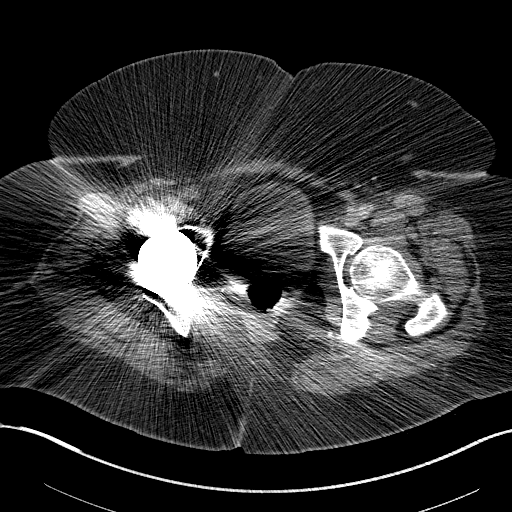
[im 5/87  bone]
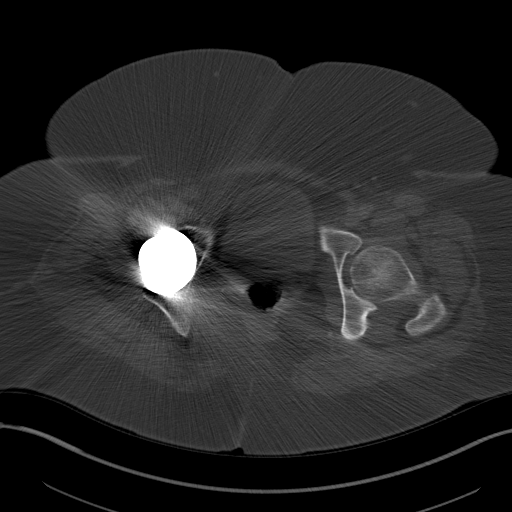
[im 10/87  soft-tissue]
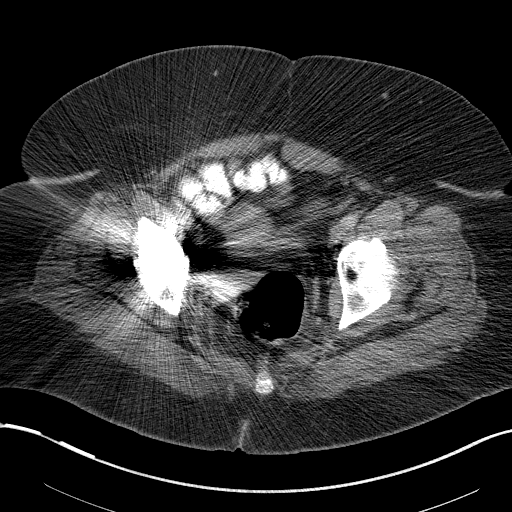
[im 20/87  soft-tissue]
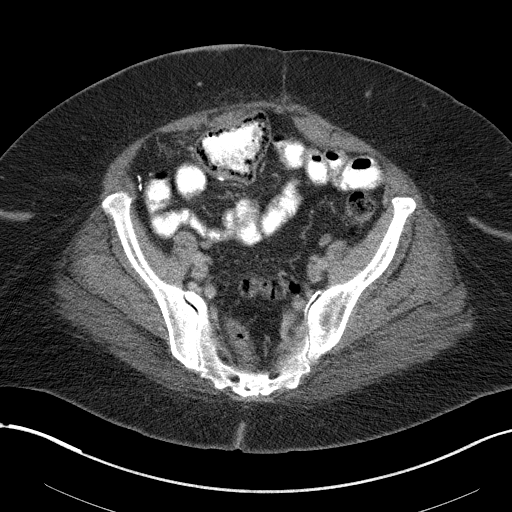
[im 24/87  soft-tissue]
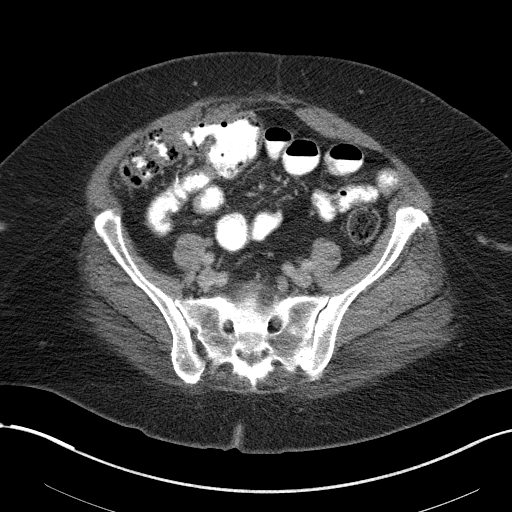
[im 29/87  soft-tissue]
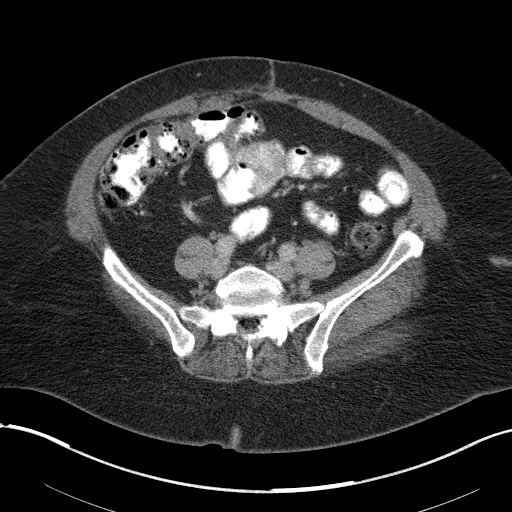
[im 34/87  soft-tissue]
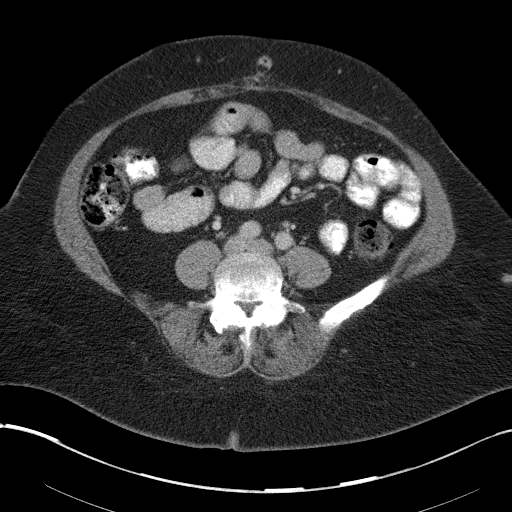
[im 39/87  soft-tissue]
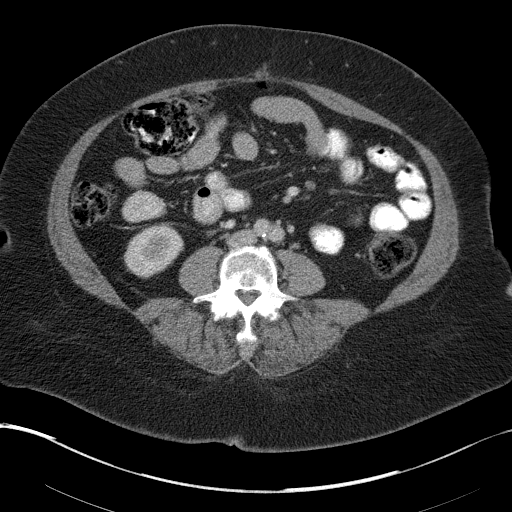
[im 48/87  soft-tissue]
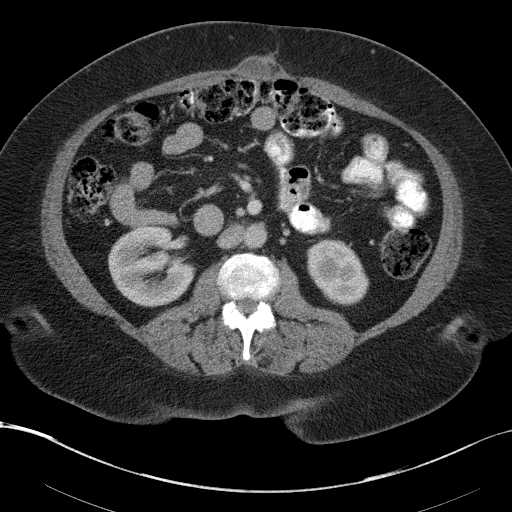
[im 53/87  soft-tissue]
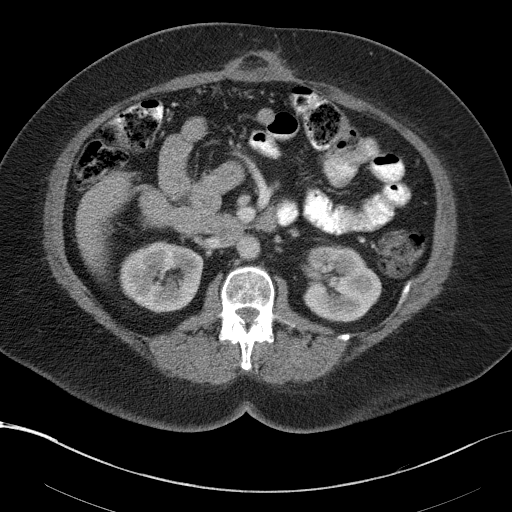
[im 53/87  bone]
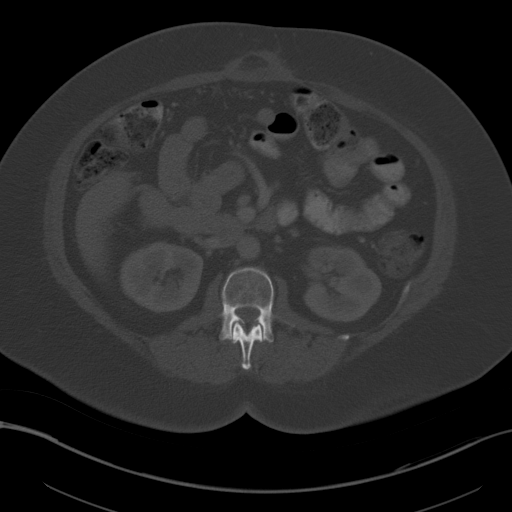
[im 58/87  soft-tissue]
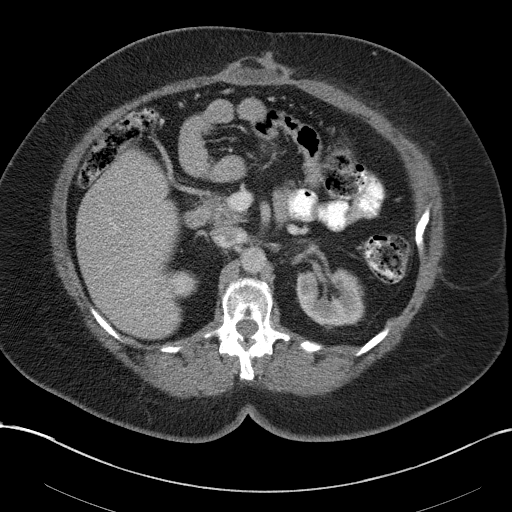
[im 63/87  soft-tissue]
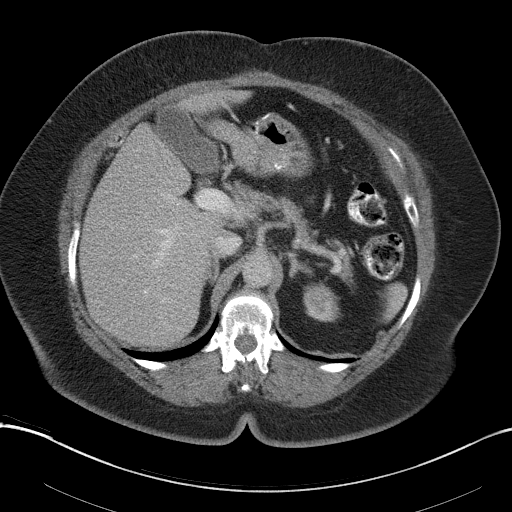
[im 67/87  soft-tissue]
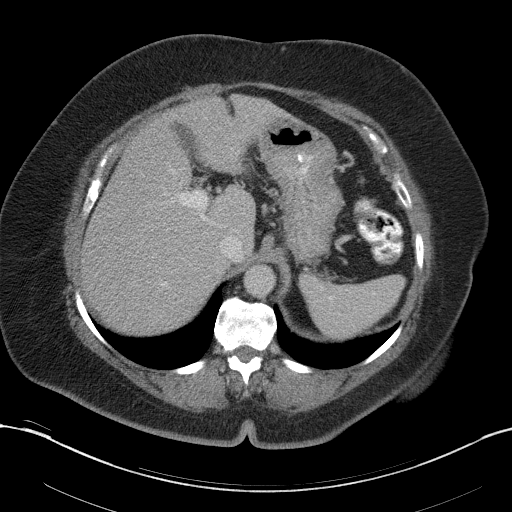
[im 77/87  soft-tissue]
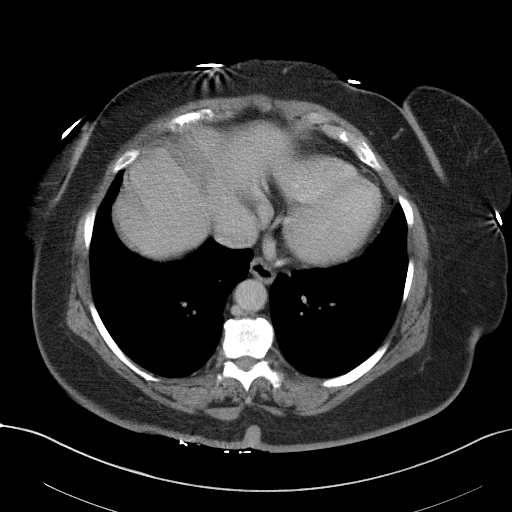
[im 82/87  soft-tissue]
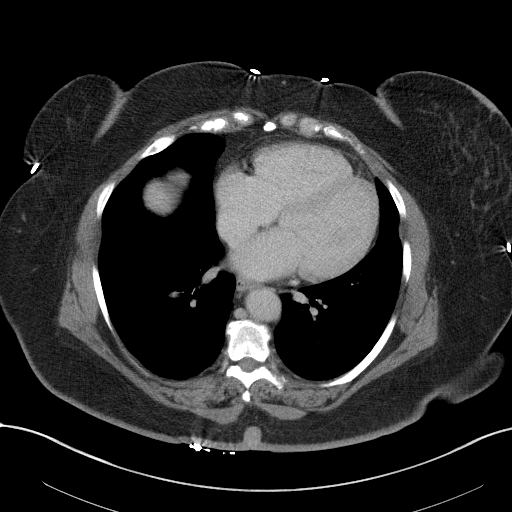

[Series 4: abd/pelvis 3.0 coronal · coronal · 0.80mm/px · 3 of 95 slices shown]
[im 32/95  soft-tissue]
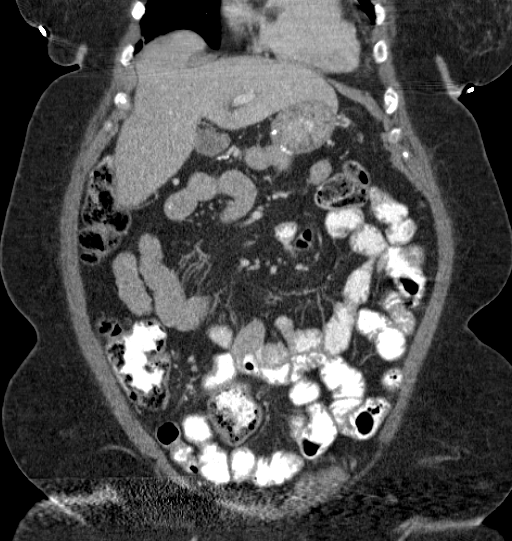
[im 42/95  soft-tissue]
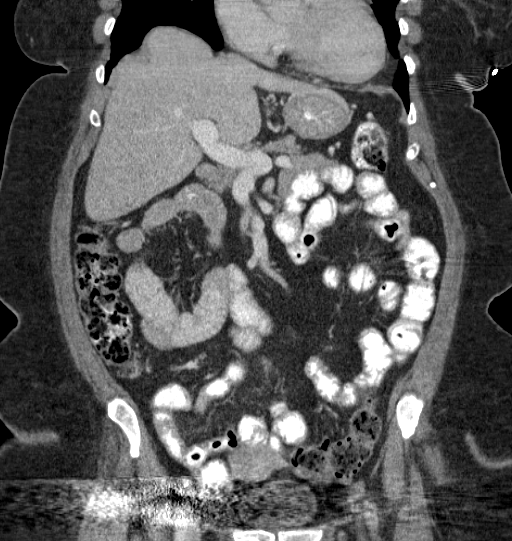
[im 53/95  soft-tissue]
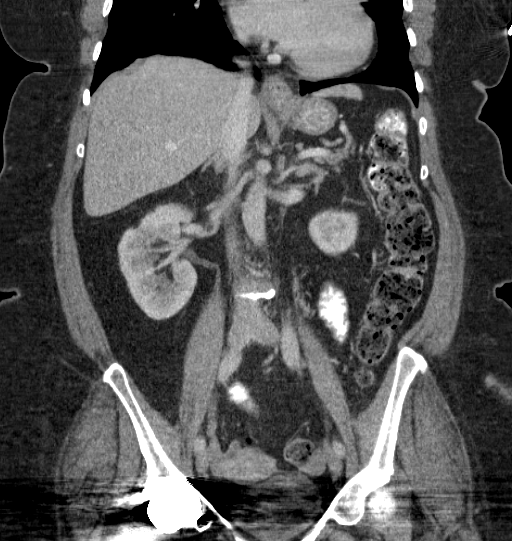

[17 of 46 positions shown; findings below may reference images not displayed]

FINDINGS: Lung bases are clear.

Small hiatal hernia.  Prior distal gastrectomy with
gastrojejunostomy.

Liver, spleen, pancreas, and adrenal glands are within normal
limits.

Gallbladder is unremarkable.  No intrahepatic or extrahepatic
ductal dilatation.

Kidneys are notable for bilateral cortical atrophy / scarring, most
prominently in the left upper kidney.  No hydronephrosis.

No evidence of bowel obstruction.  Prior appendectomy.

Atherosclerotic calcifications of the abdominal aorta and branch
vessels.

No abdominopelvic ascites.

No suspicious abdominopelvic lymphadenopathy.

Uterus and bilateral ovaries are unremarkable.

Bladder is partially obscured by streak artifact.

Postsurgical changes with stable fat/fluid in the midline anterior
abdominal wall (series 2/image 34).

Right hip arthroplasty.  Degenerative changes of the visualized
thoracolumbar spine.
IMPRESSION: Status post distal gastrectomy with gastrojejunostomy.

No evidence of metastatic disease in the abdomen/pelvis.

## 2013-10-15 ENCOUNTER — Encounter: Payer: Self-pay | Admitting: *Deleted

## 2013-10-15 ENCOUNTER — Encounter (HOSPITAL_COMMUNITY): Payer: Self-pay | Admitting: Emergency Medicine

## 2013-10-15 ENCOUNTER — Emergency Department (HOSPITAL_COMMUNITY): Payer: Medicare Other

## 2013-10-15 ENCOUNTER — Other Ambulatory Visit: Payer: Medicare Other

## 2013-10-15 ENCOUNTER — Emergency Department (HOSPITAL_COMMUNITY)
Admission: EM | Admit: 2013-10-15 | Discharge: 2013-10-15 | Disposition: A | Discharge status: Home / Self Care | Payer: Medicare Other | Attending: Emergency Medicine | Admitting: Emergency Medicine

## 2013-10-15 DIAGNOSIS — I10 Essential (primary) hypertension: Secondary | ICD-10-CM | POA: Insufficient documentation

## 2013-10-15 DIAGNOSIS — E119 Type 2 diabetes mellitus without complications: Secondary | ICD-10-CM | POA: Insufficient documentation

## 2013-10-15 DIAGNOSIS — J45909 Unspecified asthma, uncomplicated: Secondary | ICD-10-CM | POA: Insufficient documentation

## 2013-10-15 DIAGNOSIS — E669 Obesity, unspecified: Secondary | ICD-10-CM | POA: Insufficient documentation

## 2013-10-15 DIAGNOSIS — Z880 Allergy status to penicillin: Secondary | ICD-10-CM | POA: Insufficient documentation

## 2013-10-15 DIAGNOSIS — Z87891 Personal history of nicotine dependence: Secondary | ICD-10-CM | POA: Insufficient documentation

## 2013-10-15 DIAGNOSIS — R000 Tachycardia, unspecified: Secondary | ICD-10-CM | POA: Insufficient documentation

## 2013-10-15 DIAGNOSIS — Z9221 Personal history of antineoplastic chemotherapy: Secondary | ICD-10-CM | POA: Insufficient documentation

## 2013-10-15 DIAGNOSIS — K5900 Constipation, unspecified: Secondary | ICD-10-CM | POA: Insufficient documentation

## 2013-10-15 DIAGNOSIS — Z79899 Other long term (current) drug therapy: Secondary | ICD-10-CM | POA: Insufficient documentation

## 2013-10-15 DIAGNOSIS — Z85028 Personal history of other malignant neoplasm of stomach: Secondary | ICD-10-CM | POA: Insufficient documentation

## 2013-10-15 LAB — CBC WITH DIFFERENTIAL/PLATELET
BASOS PCT: 0 % (ref 0–1)
Basophils Absolute: 0 10*3/uL (ref 0.0–0.1)
EOS PCT: 1 % (ref 0–5)
Eosinophils Absolute: 0.2 10*3/uL (ref 0.0–0.7)
HEMATOCRIT: 37.4 % (ref 36.0–46.0)
Hemoglobin: 12.5 g/dL (ref 12.0–15.0)
LYMPHS PCT: 13 % (ref 12–46)
Lymphs Abs: 2.5 10*3/uL (ref 0.7–4.0)
MCH: 28.5 pg (ref 26.0–34.0)
MCHC: 33.4 g/dL (ref 30.0–36.0)
MCV: 85.2 fL (ref 78.0–100.0)
MONO ABS: 1.3 10*3/uL — AB (ref 0.1–1.0)
Monocytes Relative: 7 % (ref 3–12)
Neutro Abs: 14.9 10*3/uL — ABNORMAL HIGH (ref 1.7–7.7)
Neutrophils Relative %: 79 % — ABNORMAL HIGH (ref 43–77)
PLATELETS: 358 10*3/uL (ref 150–400)
RBC: 4.39 MIL/uL (ref 3.87–5.11)
RDW: 15.3 % (ref 11.5–15.5)
WBC: 18.9 10*3/uL — AB (ref 4.0–10.5)

## 2013-10-15 LAB — URINALYSIS, ROUTINE W REFLEX MICROSCOPIC
Bilirubin Urine: NEGATIVE
Glucose, UA: 500 mg/dL — AB
HGB URINE DIPSTICK: NEGATIVE
Ketones, ur: NEGATIVE mg/dL
LEUKOCYTES UA: NEGATIVE
Nitrite: NEGATIVE
PROTEIN: NEGATIVE mg/dL
SPECIFIC GRAVITY, URINE: 1.014 (ref 1.005–1.030)
UROBILINOGEN UA: 0.2 mg/dL (ref 0.0–1.0)
pH: 5.5 (ref 5.0–8.0)

## 2013-10-15 LAB — COMPREHENSIVE METABOLIC PANEL
ALT: 14 U/L (ref 0–35)
AST: 15 U/L (ref 0–37)
Albumin: 3.6 g/dL (ref 3.5–5.2)
Alkaline Phosphatase: 85 U/L (ref 39–117)
BUN: 21 mg/dL (ref 6–23)
CALCIUM: 9.3 mg/dL (ref 8.4–10.5)
CO2: 22 meq/L (ref 19–32)
CREATININE: 1.31 mg/dL — AB (ref 0.50–1.10)
Chloride: 101 mEq/L (ref 96–112)
GFR, EST AFRICAN AMERICAN: 50 mL/min — AB (ref >90)
GFR, EST NON AFRICAN AMERICAN: 43 mL/min — AB (ref >90)
Glucose, Bld: 200 mg/dL — ABNORMAL HIGH (ref 70–99)
Potassium: 5 mEq/L (ref 3.7–5.3)
Sodium: 138 mEq/L (ref 137–147)
Total Bilirubin: 0.3 mg/dL (ref 0.3–1.2)
Total Protein: 7.6 g/dL (ref 6.0–8.3)

## 2013-10-15 LAB — LIPASE, BLOOD: Lipase: 10 U/L — ABNORMAL LOW (ref 11–59)

## 2013-10-15 MED ORDER — MORPHINE SULFATE 4 MG/ML IJ SOLN
4.0000 mg | Freq: Once | INTRAMUSCULAR | Status: AC
  Administered 2013-10-15: 4 mg via INTRAVENOUS
  Filled 2013-10-15: qty 1

## 2013-10-15 MED ORDER — SENNOSIDES-DOCUSATE SODIUM 8.6-50 MG PO TABS: 2.0000 | ORAL_TABLET | Freq: Every day | ORAL | Status: DC

## 2013-10-15 MED ORDER — IOHEXOL 300 MG/ML  SOLN
50.0000 mL | Freq: Once | INTRAMUSCULAR | Status: AC | PRN
  Administered 2013-10-15: 50 mL via ORAL

## 2013-10-15 MED ORDER — ONDANSETRON HCL 4 MG/2ML IJ SOLN
4.0000 mg | Freq: Once | INTRAMUSCULAR | Status: AC
  Administered 2013-10-15: 4 mg via INTRAVENOUS
  Filled 2013-10-15: qty 2

## 2013-10-15 MED ORDER — SODIUM CHLORIDE 0.9 % IV BOLUS (SEPSIS)
500.0000 mL | Freq: Once | INTRAVENOUS | Status: AC
  Administered 2013-10-15: 500 mL via INTRAVENOUS

## 2013-10-15 MED ORDER — IOHEXOL 300 MG/ML  SOLN
100.0000 mL | Freq: Once | INTRAMUSCULAR | Status: AC | PRN
  Administered 2013-10-15: 100 mL via INTRAVENOUS

## 2013-10-15 NOTE — Discharge Instructions (Signed)
Please be sure to follow-up with your physicians tomorrow to ensure appropriate ongoing care.   Constipation, Adult Constipation is when a person:  Poops (bowel movement) less than 3 times a week.  Has a hard time pooping.  Has poop that is dry, hard, or bigger than normal. HOME CARE   Eat more fiber, such as fruits, vegetables, whole grains like brown rice, and beans.  Eat less fatty foods and sugar. This includes [place] fries, hamburgers, cookies, candy, and soda.  If you are not getting enough fiber from food, take products with added fiber in them (supplements).  Drink enough fluid to keep your pee (urine) clear or pale yellow.  Go to the restroom when you feel like you need to poop. Do not hold it.  Only take medicine as told by your doctor. Do not take medicines that help you poop (laxatives) without talking to your doctor first.  Exercise on a regular basis, or as told by your doctor. GET HELP RIGHT AWAY IF:   You have bright red blood in your poop (stool).  Your constipation lasts more than 4 days or gets worse.  You have belly (abdomen) or butt (rectal) pain.  You have thin poop (as thin as a pencil).  You lose weight, and it cannot be explained. MAKE SURE YOU:   Understand these instructions.  Will watch your condition.  Will get help right away if you are not doing well or get worse. Document Released: 12/28/2007 Document Revised: 10/03/2011 Document Reviewed: 04/22/2013 [facility] Patient Information 2014 [place], [place].

## 2013-10-15 NOTE — ED Provider Notes (Signed)
CSN: [id number]     Arrival date & time 10/15/13  1232 History   First MD Initiated Contact with Patient 10/15/13 1356     Chief Complaint  Patient presents with  . Constipation  . Emesis     (Consider location/radiation/quality/duration/timing/severity/associated sxs/prior Treatment) Patient is a 63 y.o. female presenting with constipation and vomiting. The history is provided by the patient.  Constipation Associated symptoms: vomiting   Associated symptoms: no abdominal pain, no back pain, no diarrhea and no nausea   Emesis Associated symptoms: no abdominal pain, no diarrhea and no headaches    patient has had upper abdominal pain for the last month. She's had difficulty with eating. She's had an ultrasound without a clear cause she's been seen by her primary care Dr. and her oncologist. She is previous stomach cancer, but is cancer free as far she knows. She did have two thirds of her stomach taken out. No diarrhea. She states her abdomen feels more swollen. She states it hurts more after eating. She's had a negative ultrasound. She states her sugars have been a little high. She states she has not had a bowel movement the last few days. She denies dysuria. She states she's had chills and some fevers. Past Medical History  Diagnosis Date  . stomach ca dx'd 2010    chemo/xrt comp 05/2009  . Hypertension   . Diabetes mellitus   . Asthma    Past Surgical History  Procedure Laterality Date  . Total knee arthroplasty      Left  . Total hip arthroplasty      Right  . Shoulder surgery      Left  . Appendectomy    . Cesarean section    . Hernia repair    . Colon surgery    . Esophagogastroduodenoscopy N/A 10/15/2012    Procedure: ESOPHAGOGASTRODUODENOSCOPY (EGD);  Surgeon: Lear Ng, MD;  Location: [facility];  Service: Endoscopy;  Laterality: N/A;   History reviewed. No pertinent family history. History  Substance Use Topics  . Smoking status: Former Smoker -- 1.00  packs/day for 15 years    Types: Cigarettes    Start date: 09/27/1973    Quit date: 10/15/1988  . Smokeless tobacco: Never Used     Comment: quit smoking 14 years ago  . Alcohol Use: No   OB History   Grav Para Term Preterm Abortions TAB SAB Ect Mult Living                 Review of Systems  Constitutional: Negative for activity change and appetite change.  Eyes: Negative for pain.  Respiratory: Negative for chest tightness and shortness of breath.   Cardiovascular: Negative for chest pain and leg swelling.  Gastrointestinal: Positive for vomiting and constipation. Negative for nausea, abdominal pain and diarrhea.  Genitourinary: Negative for flank pain.  Musculoskeletal: Negative for back pain and neck stiffness.  Skin: Negative for rash.  Neurological: Negative for weakness, numbness and headaches.  Psychiatric/Behavioral: Negative for behavioral problems.      Allergies  Codeine; Zocor; and Penicillins  Home Medications   Current Outpatient Rx  Name  Route  Sig  Dispense  Refill  . albuterol (PROVENTIL HFA;VENTOLIN HFA) 108 (90 BASE) MCG/ACT inhaler   Inhalation   Inhale 2 puffs into the lungs every 4 (four) hours as needed for wheezing or shortness of breath.         Marland Kitchen albuterol (PROVENTIL) (2.5 MG/3ML) 0.083% nebulizer solution   Mouth/Throat  Use as directed 2.5 mg in the mouth or throat every 6 (six) hours as needed for wheezing. For shortness of breath         . carvedilol (COREG) 6.25 MG tablet   Oral   Take 6.25 mg by mouth 2 (two) times daily with a meal.          . Dapagliflozin Propanediol (FARXIGA) 10 MG TABS   Oral   Take 1 tablet by mouth daily.         Marland Kitchen esomeprazole (NEXIUM) 40 MG capsule   Oral   Take 40 mg by mouth daily.         Marland Kitchen FLUoxetine (PROZAC) 20 MG capsule   Oral   Take 20 mg by mouth daily.         Marland Kitchen glimepiride (AMARYL) 4 MG tablet   Oral   Take 4 mg by mouth 2 (two) times daily.          . montelukast  (SINGULAIR) 10 MG tablet   Oral   Take 10 mg by mouth at bedtime.           . Multiple Vitamin (MULTIVITAMIN WITH MINERALS) TABS tablet   Oral   Take 1 tablet by mouth daily.         . naproxen sodium (ANAPROX) 220 MG tablet   Oral   Take 440 mg by mouth 2 (two) times daily with a meal.         . Omega-3 Fatty Acids (FISH OIL) 1000 MG CAPS   Oral   Take 1,000 mg by mouth every morning.          . prochlorperazine (COMPAZINE) 10 MG tablet   Oral   Take 1 tablet (10 mg total) by mouth every 6 (six) hours as needed for nausea or vomiting.   60 tablet   4   . sitaGLIPtan-metformin (JANUMET) 50-1000 MG per tablet   Oral   Take 1 tablet by mouth 2 (two) times daily with a meal.          . traMADol (ULTRAM) 50 MG tablet   Oral   Take 50 mg by mouth every 8 (eight) hours as needed.         . triamterene-hydrochlorothiazide (DYAZIDE) 37.5-25 MG per capsule   Oral   Take 1 capsule by mouth daily.          Marland Kitchen senna-docusate (SENOKOT-S) 8.6-50 MG per tablet   Oral   Take 2 tablets by mouth daily.   20 tablet   0    BP 101/65  Pulse 108  Temp(Src) 99 F (37.2 C) (Oral)  Resp 16  SpO2 97% Physical Exam  Nursing note and vitals reviewed. Constitutional: She is oriented to person, place, and time. She appears well-developed and well-nourished.  Patient is obese.  HENT:  Head: Normocephalic and atraumatic.  Eyes: EOM are normal. Pupils are equal, round, and reactive to light.  Neck: Normal range of motion. Neck supple.  Cardiovascular: Regular rhythm and normal heart sounds.   No murmur heard. Mild tachycardia  Pulmonary/Chest: Effort normal and breath sounds normal. No respiratory distress. She has no wheezes. She has no rales.  Abdominal: Soft. Bowel sounds are normal. She exhibits no distension. There is tenderness. There is no rebound and no guarding.  Moderate epigastric tenderness. Some guarding.   Musculoskeletal: Normal range of motion.  Neurological:  She is alert and oriented to person, place, and time. No cranial nerve deficit.  Skin:  Skin is warm and dry.  Psychiatric: She has a normal mood and affect. Her speech is normal.    ED Course  Procedures (including critical care time) Labs Review Labs Reviewed  CBC WITH DIFFERENTIAL - Abnormal; Notable for the following:    WBC 18.9 (*)    Neutrophils Relative % 79 (*)    Neutro Abs 14.9 (*)    Monocytes Absolute 1.3 (*)    All other components within normal limits  COMPREHENSIVE METABOLIC PANEL - Abnormal; Notable for the following:    Glucose, Bld 200 (*)    Creatinine, Ser 1.31 (*)    GFR calc non Af Amer 43 (*)    GFR calc Af Amer 50 (*)    All other components within normal limits  LIPASE, BLOOD - Abnormal; Notable for the following:    Lipase 10 (*)    All other components within normal limits  URINALYSIS, ROUTINE W REFLEX MICROSCOPIC - Abnormal; Notable for the following:    Glucose, UA 500 (*)    All other components within normal limits   Imaging Review No results found.   EKG Interpretation None      MDM   Final diagnoses:  Constipation   Patient with abdominal pain. CT scan shows constipation. White count is elevated. Chest x-ray done due to possible pneumonia on CT. Care signed out to Dr. Gordy Savers R. Alvino Chapel, MD 10/17/13 2207

## 2013-10-15 NOTE — ED Notes (Signed)
Pt c/o constipation and emesis x 3 days.  Pain score 8/10.  Pt reports eating prune w/o relief.  Pt would like to be evaluated for a bowel obstruction.

## 2013-10-15 NOTE — Progress Notes (Signed)
Pt called to report constipation x 1 week.  Eating prunes.  Had small BM last night and this morning she was having chills and feeling "terrible".  States she does not have thermometer.  Advised her to go to the ER.  Dr. Marin Olp made aware.

## 2013-10-15 NOTE — ED Provider Notes (Signed)
4:23 PM Patient aware of XR results.  She appears in no distress, resting, watching TV. Per Dr. Alvino Chapel, with unremarkable XR, she was d/c w Senna S, PMD F/U.   Carmin Muskrat, MD 10/15/13 [phone]

## 2013-10-23 ENCOUNTER — Ambulatory Visit: Payer: Medicare Other

## 2013-11-07 ENCOUNTER — Ambulatory Visit (HOSPITAL_BASED_OUTPATIENT_CLINIC_OR_DEPARTMENT_OTHER): Payer: Medicare HMO

## 2013-11-07 VITALS — BP 113/74 | HR 79 | Temp 98.5°F

## 2013-11-07 DIAGNOSIS — Z95828 Presence of other vascular implants and grafts: Secondary | ICD-10-CM

## 2013-11-07 DIAGNOSIS — Z452 Encounter for adjustment and management of vascular access device: Secondary | ICD-10-CM

## 2013-11-07 DIAGNOSIS — Z85028 Personal history of other malignant neoplasm of stomach: Secondary | ICD-10-CM

## 2013-11-07 MED ORDER — SODIUM CHLORIDE 0.9 % IJ SOLN
10.0000 mL | INTRAMUSCULAR | Status: DC | PRN
Start: 2013-11-07 — End: 2013-11-07
  Administered 2013-11-07: 10 mL via INTRAVENOUS
  Filled 2013-11-07: qty 10

## 2013-11-07 MED ORDER — HEPARIN SOD (PORK) LOCK FLUSH 100 UNIT/ML IV SOLN
500.0000 [IU] | Freq: Once | INTRAVENOUS | Status: AC
  Administered 2013-11-07: 500 [IU] via INTRAVENOUS
  Filled 2013-11-07: qty 5

## 2013-11-13 ENCOUNTER — Telehealth: Payer: Self-pay | Admitting: Hematology & Oncology

## 2013-11-13 NOTE — Telephone Encounter (Signed)
Pt made 4-23 lab appointment, per RN

## 2013-11-14 ENCOUNTER — Other Ambulatory Visit (HOSPITAL_BASED_OUTPATIENT_CLINIC_OR_DEPARTMENT_OTHER): Payer: Medicare HMO

## 2013-11-14 DIAGNOSIS — IMO0001 Reserved for inherently not codable concepts without codable children: Secondary | ICD-10-CM

## 2013-11-14 DIAGNOSIS — Z85028 Personal history of other malignant neoplasm of stomach: Secondary | ICD-10-CM

## 2013-11-14 DIAGNOSIS — D638 Anemia in other chronic diseases classified elsewhere: Secondary | ICD-10-CM

## 2013-11-14 DIAGNOSIS — C50919 Malignant neoplasm of unspecified site of unspecified female breast: Secondary | ICD-10-CM

## 2013-11-14 DIAGNOSIS — D509 Iron deficiency anemia, unspecified: Secondary | ICD-10-CM

## 2013-11-14 DIAGNOSIS — E119 Type 2 diabetes mellitus without complications: Principal | ICD-10-CM

## 2013-11-14 DIAGNOSIS — Z794 Long term (current) use of insulin: Principal | ICD-10-CM

## 2013-11-14 LAB — CBC & DIFF AND RETIC
BASO%: 0.6 % (ref 0.0–2.0)
Basophils Absolute: 0 10*3/uL (ref 0.0–0.1)
EOS%: 6.7 % (ref 0.0–7.0)
Eosinophils Absolute: 0.5 10*3/uL (ref 0.0–0.5)
HCT: 39.6 % (ref 34.8–46.6)
HGB: 13 g/dL (ref 11.6–15.9)
IMMATURE RETIC FRACT: 17.2 % — AB (ref 1.60–10.00)
LYMPH#: 2.5 10*3/uL (ref 0.9–3.3)
LYMPH%: 35.6 % (ref 14.0–49.7)
MCH: 28.4 pg (ref 25.1–34.0)
MCHC: 32.8 g/dL (ref 31.5–36.0)
MCV: 86.5 fL (ref 79.5–101.0)
MONO#: 0.7 10*3/uL (ref 0.1–0.9)
MONO%: 9.8 % (ref 0.0–14.0)
NEUT%: 47.3 % (ref 38.4–76.8)
NEUTROS ABS: 3.4 10*3/uL (ref 1.5–6.5)
Platelets: 360 10*3/uL (ref 145–400)
RBC: 4.58 10*6/uL (ref 3.70–5.45)
RDW: 15.5 % — AB (ref 11.2–14.5)
Retic %: 1.96 % (ref 0.70–2.10)
Retic Ct Abs: 89.77 10*3/uL (ref 33.70–90.70)
WBC: 7.1 10*3/uL (ref 3.9–10.3)

## 2013-11-14 LAB — COMPREHENSIVE METABOLIC PANEL (CC13)
ALBUMIN: 3.8 g/dL (ref 3.5–5.0)
ALT: 14 U/L (ref 0–55)
ANION GAP: 12 meq/L — AB (ref 3–11)
AST: 16 U/L (ref 5–34)
Alkaline Phosphatase: 75 U/L (ref 40–150)
BUN: 17.8 mg/dL (ref 7.0–26.0)
CO2: 24 mEq/L (ref 22–29)
Calcium: 10.4 mg/dL (ref 8.4–10.4)
Chloride: 104 mEq/L (ref 98–109)
Creatinine: 1.7 mg/dL — ABNORMAL HIGH (ref 0.6–1.1)
GLUCOSE: 157 mg/dL — AB (ref 70–140)
Potassium: 4.6 mEq/L (ref 3.5–5.1)
SODIUM: 141 meq/L (ref 136–145)
Total Bilirubin: 0.38 mg/dL (ref 0.20–1.20)
Total Protein: 8.1 g/dL (ref 6.4–8.3)

## 2013-11-14 LAB — IRON AND TIBC CHCC
%SAT: 16 % — ABNORMAL LOW (ref 21–57)
IRON: 49 ug/dL (ref 41–142)
TIBC: 313 ug/dL (ref 236–444)
UIBC: 264 ug/dL (ref 120–384)

## 2013-11-14 LAB — FERRITIN CHCC: Ferritin: 79 ng/ml (ref 9–269)

## 2013-11-14 LAB — HEMOGLOBIN A1C
Hgb A1c MFr Bld: 8.2 % — ABNORMAL HIGH (ref <5.7)
Mean Plasma Glucose: 189 mg/dL — ABNORMAL HIGH (ref <117)

## 2013-11-15 ENCOUNTER — Telehealth: Payer: Self-pay | Admitting: *Deleted

## 2013-11-15 ENCOUNTER — Telehealth: Payer: Self-pay | Admitting: Hematology & Oncology

## 2013-11-15 NOTE — Telephone Encounter (Signed)
Pt aware of 4-29 iron infusion

## 2013-11-15 NOTE — Telephone Encounter (Addendum)
Message copied by Orlando Penner on Fri Nov 15, 2013  2:29 PM ------      Message from: Volanda Napoleon      Created: Fri Nov 15, 2013  6:59 AM       Call - iron is low. Feraheme 1059m x 1 dose is needed.  Please set up.  Pete ------This message given to pt and then to scheduler to schedule iron infusion next week.

## 2013-11-20 ENCOUNTER — Ambulatory Visit (HOSPITAL_BASED_OUTPATIENT_CLINIC_OR_DEPARTMENT_OTHER): Payer: Medicare HMO

## 2013-11-20 ENCOUNTER — Other Ambulatory Visit: Payer: Self-pay | Admitting: Nurse Practitioner

## 2013-11-20 VITALS — BP 103/68 | HR 78 | Temp 97.5°F | Resp 18

## 2013-11-20 DIAGNOSIS — D649 Anemia, unspecified: Secondary | ICD-10-CM

## 2013-11-20 DIAGNOSIS — C169 Malignant neoplasm of stomach, unspecified: Secondary | ICD-10-CM

## 2013-11-20 DIAGNOSIS — D509 Iron deficiency anemia, unspecified: Secondary | ICD-10-CM

## 2013-11-20 MED ORDER — HEPARIN SOD (PORK) LOCK FLUSH 100 UNIT/ML IV SOLN
500.0000 [IU] | Freq: Once | INTRAVENOUS | Status: AC | PRN
  Administered 2013-11-20: 500 [IU] via INTRAVENOUS
  Filled 2013-11-20: qty 5

## 2013-11-20 MED ORDER — LIDOCAINE-PRILOCAINE 2.5-2.5 % EX CREA: 1.0000 "application " | TOPICAL_CREAM | CUTANEOUS | Status: DC | PRN

## 2013-11-20 MED ORDER — SODIUM CHLORIDE 0.9 % IJ SOLN
10.0000 mL | INTRAMUSCULAR | Status: DC | PRN
  Administered 2013-11-20: 10 mL via INTRAVENOUS
  Filled 2013-11-20: qty 10

## 2013-11-20 MED ORDER — SODIUM CHLORIDE 0.9 % IV SOLN
INTRAVENOUS | Status: DC
  Administered 2013-11-20: 13:00:00 via INTRAVENOUS

## 2013-11-20 MED ORDER — SODIUM CHLORIDE 0.9 % IV SOLN
1020.0000 mg | Freq: Once | INTRAVENOUS | Status: AC
  Administered 2013-11-20: 1020 mg via INTRAVENOUS
  Filled 2013-11-20: qty 34

## 2013-11-20 MED ORDER — SODIUM CHLORIDE 0.45 % IV SOLN: Freq: Once | INTRAVENOUS | Status: DC

## 2013-11-20 NOTE — Patient Instructions (Signed)

## 2013-12-09 ENCOUNTER — Encounter (HOSPITAL_BASED_OUTPATIENT_CLINIC_OR_DEPARTMENT_OTHER): Payer: Self-pay | Admitting: Emergency Medicine

## 2013-12-09 ENCOUNTER — Emergency Department (HOSPITAL_BASED_OUTPATIENT_CLINIC_OR_DEPARTMENT_OTHER)
Admission: EM | Admit: 2013-12-09 | Discharge: 2013-12-09 | Disposition: A | Discharge status: Home / Self Care | Payer: Medicare HMO | Attending: Emergency Medicine | Admitting: Emergency Medicine

## 2013-12-09 ENCOUNTER — Telehealth: Payer: Self-pay | Admitting: *Deleted

## 2013-12-09 ENCOUNTER — Telehealth: Payer: Self-pay | Admitting: Hematology & Oncology

## 2013-12-09 DIAGNOSIS — R634 Abnormal weight loss: Secondary | ICD-10-CM | POA: Insufficient documentation

## 2013-12-09 DIAGNOSIS — F3289 Other specified depressive episodes: Secondary | ICD-10-CM | POA: Insufficient documentation

## 2013-12-09 DIAGNOSIS — R630 Anorexia: Secondary | ICD-10-CM | POA: Insufficient documentation

## 2013-12-09 DIAGNOSIS — F32A Depression, unspecified: Secondary | ICD-10-CM

## 2013-12-09 DIAGNOSIS — E119 Type 2 diabetes mellitus without complications: Secondary | ICD-10-CM | POA: Insufficient documentation

## 2013-12-09 DIAGNOSIS — Z791 Long term (current) use of non-steroidal anti-inflammatories (NSAID): Secondary | ICD-10-CM | POA: Insufficient documentation

## 2013-12-09 DIAGNOSIS — R519 Headache, unspecified: Secondary | ICD-10-CM

## 2013-12-09 DIAGNOSIS — Z87891 Personal history of nicotine dependence: Secondary | ICD-10-CM | POA: Insufficient documentation

## 2013-12-09 DIAGNOSIS — F329 Major depressive disorder, single episode, unspecified: Secondary | ICD-10-CM | POA: Insufficient documentation

## 2013-12-09 DIAGNOSIS — Z85028 Personal history of other malignant neoplasm of stomach: Secondary | ICD-10-CM | POA: Insufficient documentation

## 2013-12-09 DIAGNOSIS — I10 Essential (primary) hypertension: Secondary | ICD-10-CM | POA: Insufficient documentation

## 2013-12-09 DIAGNOSIS — Z880 Allergy status to penicillin: Secondary | ICD-10-CM | POA: Insufficient documentation

## 2013-12-09 DIAGNOSIS — R51 Headache: Secondary | ICD-10-CM | POA: Insufficient documentation

## 2013-12-09 DIAGNOSIS — J45909 Unspecified asthma, uncomplicated: Secondary | ICD-10-CM | POA: Insufficient documentation

## 2013-12-09 DIAGNOSIS — Z79899 Other long term (current) drug therapy: Secondary | ICD-10-CM | POA: Insufficient documentation

## 2013-12-09 LAB — CBC WITH DIFFERENTIAL/PLATELET
BASOS ABS: 0 10*3/uL (ref 0.0–0.1)
Basophils Relative: 0 % (ref 0–1)
EOS PCT: 3 % (ref 0–5)
Eosinophils Absolute: 0.2 10*3/uL (ref 0.0–0.7)
HCT: 40.8 % (ref 36.0–46.0)
Hemoglobin: 14 g/dL (ref 12.0–15.0)
LYMPHS PCT: 32 % (ref 12–46)
Lymphs Abs: 2.2 10*3/uL (ref 0.7–4.0)
MCH: 29.8 pg (ref 26.0–34.0)
MCHC: 34.3 g/dL (ref 30.0–36.0)
MCV: 86.8 fL (ref 78.0–100.0)
Monocytes Absolute: 0.7 10*3/uL (ref 0.1–1.0)
Monocytes Relative: 10 % (ref 3–12)
Neutro Abs: 3.8 10*3/uL (ref 1.7–7.7)
Neutrophils Relative %: 55 % (ref 43–77)
PLATELETS: 319 10*3/uL (ref 150–400)
RBC: 4.7 MIL/uL (ref 3.87–5.11)
RDW: 15.4 % (ref 11.5–15.5)
WBC: 6.9 10*3/uL (ref 4.0–10.5)

## 2013-12-09 LAB — COMPREHENSIVE METABOLIC PANEL
ALT: 23 U/L (ref 0–35)
AST: 23 U/L (ref 0–37)
Albumin: 4.3 g/dL (ref 3.5–5.2)
Alkaline Phosphatase: 67 U/L (ref 39–117)
BUN: 22 mg/dL (ref 6–23)
CALCIUM: 10.5 mg/dL (ref 8.4–10.5)
CO2: 23 meq/L (ref 19–32)
Chloride: 101 mEq/L (ref 96–112)
Creatinine, Ser: 1.7 mg/dL — ABNORMAL HIGH (ref 0.50–1.10)
GFR calc Af Amer: 36 mL/min — ABNORMAL LOW (ref >90)
GFR calc non Af Amer: 31 mL/min — ABNORMAL LOW (ref >90)
Glucose, Bld: 156 mg/dL — ABNORMAL HIGH (ref 70–99)
Potassium: 4.5 mEq/L (ref 3.7–5.3)
Sodium: 140 mEq/L (ref 137–147)
TOTAL PROTEIN: 8.3 g/dL (ref 6.0–8.3)
Total Bilirubin: 0.4 mg/dL (ref 0.3–1.2)

## 2013-12-09 LAB — URINALYSIS, ROUTINE W REFLEX MICROSCOPIC
Glucose, UA: 100 mg/dL — AB
HGB URINE DIPSTICK: NEGATIVE
Ketones, ur: 15 mg/dL — AB
Leukocytes, UA: NEGATIVE
Nitrite: NEGATIVE
PH: 5 (ref 5.0–8.0)
Protein, ur: NEGATIVE mg/dL
Specific Gravity, Urine: 1.016 (ref 1.005–1.030)
Urobilinogen, UA: 1 mg/dL (ref 0.0–1.0)

## 2013-12-09 LAB — SEDIMENTATION RATE: Sed Rate: 15 mm/hr (ref 0–22)

## 2013-12-09 MED ORDER — ACETAMINOPHEN 325 MG PO TABS: 650.0000 mg | ORAL_TABLET | ORAL | Status: DC | PRN

## 2013-12-09 MED ORDER — FLUOXETINE HCL 40 MG PO CAPS: 40.0000 mg | ORAL_CAPSULE | Freq: Every day | ORAL | Status: DC

## 2013-12-09 MED ORDER — ONDANSETRON HCL 8 MG PO TABS: 4.0000 mg | ORAL_TABLET | Freq: Three times a day (TID) | ORAL | Status: DC | PRN

## 2013-12-09 NOTE — ED Notes (Signed)
Spoke with Tori at [facility] who states there are still 3 pts ahead awaiting consult, RN made aware.

## 2013-12-09 NOTE — B[facility] Counselor (Signed)
This Probation officer spoke with [employer], PA, states pt denies SI/HI/AVH, inpt admission is not needed at this time.  PA is requesting medication recommendation.  This Probation officer will Patriciaann Clan, [place] for further examination.

## 2013-12-09 NOTE — ED Notes (Addendum)
TTS in progress 

## 2013-12-09 NOTE — ED Notes (Signed)
[name] made aware pt still waiting for TTS with 3 patients in front of her

## 2013-12-09 NOTE — Telephone Encounter (Signed)
Patient called to tell us that she is on her way to the ED at [facility] due to sharp pains in her head as well as depression.

## 2013-12-09 NOTE — ED Notes (Signed)
With Tori at [facility] regarding pt's TTS consult- She states that PA will be calling shortly to speak to pt. Advised that pt's family member is being transferred and pt will have to go with him at the time

## 2013-12-09 NOTE — ED Provider Notes (Signed)
CSN: [id number]     Arrival date & time 12/09/13  1539 History   First MD Initiated Contact with Patient 12/09/13 1610     Chief Complaint  Patient presents with  . Headache     (Consider location/radiation/quality/duration/timing/severity/associated sxs/prior Treatment) HPI Comments: She also reports she has no appetite. She eats one meal daily and has to take medication for nausea when she eats. She reports a 35-pound weight loss in the last 30 days. She has a history of gastric cancer that is in full remission. She finished all cancer treatments 4 years ago. She denies that current anorexia are related to previous condition.  Patient is a 63 y.o. female presenting with headaches and mental health disorder. The history is provided by the patient. No language interpreter was used.  Headache Pain location:  R temporal Quality:  Sharp and stabbing Radiates to:  Face Associated symptoms: diarrhea   Associated symptoms: no abdominal pain, no congestion, no cough, no dizziness, no ear pain, no fever, no myalgias, no numbness, no sinus pressure, no sore throat and no vomiting   Associated symptoms comment:  Pain that is sudden onset, last a few seconds and resolves. It involves the right paranasal cheek, radiates to forehead then back to ear. The started occurring one week ago. No hearing changes. The headaches do not make her nauseous. She has bilateral blurry vision for the past 2 weeks, and is scheduled to see an ophthalmologist tomorrow. She denies having similar headaches in the past.  Mental Health Problem Presenting symptoms: depression   Presenting symptoms: no hallucinations, no homicidal ideas and no suicidal thoughts   Degree of incapacity (severity):  Mild Onset quality:  Gradual Associated symptoms: appetite change and headaches   Associated symptoms: no abdominal pain   Associated symptoms comment:  She reports symptoms of depression described as not wanting to do anything at  anytime. She is tired. She has crying episodes everyday without reason. She is currently taking Prozac prescribed by [name]. She started it one year ago and had a recent increase 2 weeks ago. No suicidal ideation, homicidal ideation or history of attempt.    Past Medical History  Diagnosis Date  . stomach ca dx'd 2010    chemo/xrt comp 05/2009  . Hypertension   . Diabetes mellitus   . Asthma    Past Surgical History  Procedure Laterality Date  . Total knee arthroplasty      Left  . Total hip arthroplasty      Right  . Shoulder surgery      Left  . Appendectomy    . Cesarean section    . Hernia repair    . Colon surgery    . Esophagogastroduodenoscopy N/A 10/15/2012    Procedure: ESOPHAGOGASTRODUODENOSCOPY (EGD);  Surgeon: Lear Ng, MD;  Location: [facility];  Service: Endoscopy;  Laterality: N/A;   History reviewed. No pertinent family history. History  Substance Use Topics  . Smoking status: Former Smoker -- 1.00 packs/day for 15 years    Types: Cigarettes    Start date: 09/27/1973    Quit date: 10/15/1988  . Smokeless tobacco: Never Used     Comment: quit smoking 14 years ago  . Alcohol Use: No   OB History   Grav Para Term Preterm Abortions TAB SAB Ect Mult Living                 Review of Systems  Constitutional: Positive for appetite change and unexpected weight change. Negative  for fever and chills.  HENT: Negative for congestion, ear pain, facial swelling, sinus pressure and sore throat.   Eyes: Positive for visual disturbance.  Respiratory: Negative.  Negative for cough and shortness of breath.   Cardiovascular: Negative.   Gastrointestinal: Positive for diarrhea. Negative for vomiting, abdominal pain and blood in stool.  Genitourinary: Negative for dysuria.  Musculoskeletal: Negative.  Negative for myalgias.  Skin: Negative.  Negative for rash.  Neurological: Positive for headaches. Negative for dizziness, speech difficulty, weakness and  numbness.  Psychiatric/Behavioral: Positive for dysphoric mood. Negative for suicidal ideas, homicidal ideas, hallucinations and confusion.      Allergies  Codeine; Zocor; and Penicillins  Home Medications   Prior to Admission medications   Medication Sig Start Date End Date Taking? Authorizing Provider  albuterol (PROVENTIL HFA;VENTOLIN HFA) 108 (90 BASE) MCG/ACT inhaler Inhale 2 puffs into the lungs every 4 (four) hours as needed for wheezing or shortness of breath.    Historical Provider, MD  albuterol (PROVENTIL) (2.5 MG/3ML) 0.083% nebulizer solution Use as directed 2.5 mg in the mouth or throat every 6 (six) hours as needed for wheezing. For shortness of breath    Historical Provider, MD  carvedilol (COREG) 6.25 MG tablet Take 6.25 mg by mouth 2 (two) times daily with a meal.  10/16/12   Clent Demark, MD  Dapagliflozin Propanediol (FARXIGA) 10 MG TABS Take 1 tablet by mouth daily.    Historical Provider, MD  esomeprazole (NEXIUM) 40 MG capsule Take 40 mg by mouth daily.    Historical Provider, MD  FLUoxetine (PROZAC) 20 MG capsule Take 20 mg by mouth daily. 08/31/12   Volanda Napoleon, MD  glimepiride (AMARYL) 4 MG tablet Take 4 mg by mouth 2 (two) times daily.  08/28/12   Historical Provider, MD  lidocaine-prilocaine (EMLA) cream Apply 1 application topically as needed. 11/20/13   Volanda Napoleon, MD  montelukast (SINGULAIR) 10 MG tablet Take 10 mg by mouth at bedtime.      Historical Provider, MD  Multiple Vitamin (MULTIVITAMIN WITH MINERALS) TABS tablet Take 1 tablet by mouth daily.    Historical Provider, MD  naproxen sodium (ANAPROX) 220 MG tablet Take 440 mg by mouth 2 (two) times daily with a meal.    Historical Provider, MD  Omega-3 Fatty Acids (FISH OIL) 1000 MG CAPS Take 1,000 mg by mouth every morning.     Historical Provider, MD  prochlorperazine (COMPAZINE) 10 MG tablet Take 1 tablet (10 mg total) by mouth every 6 (six) hours as needed for nausea or vomiting. 09/27/13   Volanda Napoleon, MD  senna-docusate (SENOKOT-S) 8.6-50 MG per tablet Take 2 tablets by mouth daily. 10/15/13   Carmin Muskrat, MD  sitaGLIPtan-metformin (JANUMET) 50-1000 MG per tablet Take 1 tablet by mouth 2 (two) times daily with a meal.     Historical Provider, MD  traMADol (ULTRAM) 50 MG tablet Take 50 mg by mouth every 8 (eight) hours as needed.    Historical Provider, MD  triamterene-hydrochlorothiazide (DYAZIDE) 37.5-25 MG per capsule Take 1 capsule by mouth daily.  02/13/13   Historical Provider, MD   BP 133/72  Pulse 95  Temp(Src) 98.8 F (37.1 C) (Oral)  Resp 16  Ht 5' 6"  (1.676 m)  Wt 245 lb (111.131 kg)  BMI 39.56 kg/m2  SpO2 97% Physical Exam  Constitutional: She is oriented to person, place, and time. She appears well-developed and well-nourished.  HENT:  Head: Normocephalic.  Eyes: Pupils are equal, round, and reactive  to light.  Neck: Normal range of motion. Neck supple.  Cardiovascular: Normal rate and regular rhythm.   Pulmonary/Chest: Effort normal and breath sounds normal.  Abdominal: Soft. Bowel sounds are normal. There is no tenderness. There is no rebound and no guarding.  Musculoskeletal: Normal range of motion.  Neurological: She is alert and oriented to person, place, and time. She has normal strength and normal reflexes. No sensory deficit. She exhibits normal muscle tone. She displays a negative Romberg sign. Coordination normal.  Skin: Skin is warm and dry. No rash noted.  Psychiatric: Her speech is normal and behavior is normal. Judgment and thought content normal. Cognition and memory are normal. She exhibits a depressed mood.    ED Course  Procedures (including critical care time) Labs Review Labs Reviewed  CBC WITH DIFFERENTIAL  COMPREHENSIVE METABOLIC PANEL  URINALYSIS, ROUTINE W REFLEX MICROSCOPIC  SEDIMENTATION RATE   Results for orders placed in visit on 11/14/13  IRON AND TIBC [facility]      Result Value Ref Range   Iron 49  41 - 142 ug/dL   TIBC  313  236 - 444 ug/dL   UIBC 264  120 - 384 ug/dL   %SAT 16 (*) 21 - 57 %  FERRITIN [facility]      Result Value Ref Range   Ferritin 79  9 - 269 ng/ml  HEMOGLOBIN A1C      Result Value Ref Range   Hemoglobin A1C 8.2 (*) <5.7 %   Mean Plasma Glucose 189 (*) <117 mg/dL  COMPREHENSIVE METABOLIC PANEL ([facility]09)      Result Value Ref Range   Sodium 141  136 - 145 mEq/L   Potassium 4.6  3.5 - 5.1 mEq/L   Chloride 104  98 - 109 mEq/L   CO2 24  22 - 29 mEq/L   Glucose 157 (*) 70 - 140 mg/dl   BUN 17.8  7.0 - 26.0 mg/dL   Creatinine 1.7 (*) 0.6 - 1.1 mg/dL   Total Bilirubin 0.38  0.20 - 1.20 mg/dL   Alkaline Phosphatase 75  40 - 150 U/L   AST 16  5 - 34 U/L   ALT 14  0 - 55 U/L   Total Protein 8.1  6.4 - 8.3 g/dL   Albumin 3.8  3.5 - 5.0 g/dL   Calcium 10.4  8.4 - 10.4 mg/dL   Anion Gap 12 (*) 3 - 11 mEq/L  CBC & DIFF AND RETIC      Result Value Ref Range   WBC 7.1  3.9 - 10.3 10e3/uL   NEUT# 3.4  1.5 - 6.5 10e3/uL   HGB 13.0  11.6 - 15.9 g/dL   HCT 39.6  34.8 - 46.6 %   Platelets 360  145 - 400 10e3/uL   MCV 86.5  79.5 - 101.0 fL   MCH 28.4  25.1 - 34.0 pg   MCHC 32.8  31.5 - 36.0 g/dL   RBC 4.58  3.70 - 5.45 10e6/uL   RDW 15.5 (*) 11.2 - 14.5 %   lymph# 2.5  0.9 - 3.3 10e3/uL   MONO# 0.7  0.1 - 0.9 10e3/uL   Eosinophils Absolute 0.5  0.0 - 0.5 10e3/uL   Basophils Absolute 0.0  0.0 - 0.1 10e3/uL   NEUT% 47.3  38.4 - 76.8 %   LYMPH% 35.6  14.0 - 49.7 %   MONO% 9.8  0.0 - 14.0 %   EOS% 6.7  0.0 - 7.0 %   BASO% 0.6  0.0 - 2.0 %   Retic % 1.96  0.70 - 2.10 %   Retic Ct Abs 89.77  33.70 - 90.70 10e3/uL   Immature Retic Fract 17.20 (*) 1.60 - 10.00 %     Imaging Review No results found.   EKG Interpretation None      MDM   Final diagnoses:  None    1. Nonspecific headache 2. Depression, stable 3. Unintentional weight loss  She is well appearing, ambulatory, NAD for duration of ED visit. Prozac refilled - per patient 40 mg daily as per recent increase. She reports she  has to leave as son is being admitted to hospital. She is stable, no SI/HI.   Discussed with [facility] who advises they were able to complete consultation prior to her discharge. Recommend no medication changes, stay on Prozac 40 mg daily and they will see her in their office this week for further management.     Dewaine Oats, PA-C 12/09/13 2137  Dewaine Oats, PA-C 12/09/13 2149

## 2013-12-09 NOTE — ED Notes (Signed)
Pt to f/u with [facility]- given rx x 1 for prozac- D/c instructions given to pt by Woodward Ku, RN

## 2013-12-09 NOTE — ED Notes (Signed)
Pt c/o h/a and depression x 2 weeks. Denies SI and HI

## 2013-12-09 NOTE — Telephone Encounter (Signed)
Pt wants to see MD this week, I don't have anything open, pt is aware to leave message on RN vm

## 2013-12-09 NOTE — Discharge Instructions (Signed)

## 2013-12-09 NOTE — ED Provider Notes (Signed)
Medical screening examination/treatment/procedure(s) were performed by non-physician practitioner and as supervising physician I was immediately available for consultation/collaboration.   EKG Interpretation None        Malvin Johns, MD 12/09/13 2307

## 2013-12-09 NOTE — ED Notes (Signed)
Pt at bedside of family member in room 11 who is also a patient in the ED. Waiting for TTS eval

## 2013-12-10 NOTE — Consult Note (Signed)
Telepsych Consultation   Reason for Consult:  Evaluation for Psychotropic Mgmt Referring Physician:  [facility] EDP [name] is an 63 y.o. female.  Assessment: AXIS I:  Mood Disorder NOS AXIS II:  No diagnosis AXIS III:   Past Medical History  Diagnosis Date  . stomach ca dx'd 2010    chemo/xrt comp 05/2009  . Hypertension   . Diabetes mellitus   . Asthma    AXIS IV:  housing problems AXIS V:  51-60 moderate symptoms  Plan:  Patient is not meeting criteria for IP admission for mgmt of mood d/o, no appearant need for crises mgmt, safety and or stabilization at this time. Case discussed with on call attending Psychiatrist Nedda Gains MD, who agrees that patient is best suited to schedule OP psychiatric appointment within the next week and possibly consider drug adjustment at that time.  Subjective:   [name] is a 63 y.o. female presenting to the [facility] ED with concerns with worsening depressive sx over the past 3- 4 week interval of time. Patient rates her depressive sx a 9/10 but is denying any SI/SA/HI/AVH , paranoia and or delusional thoughts at this present time. The patient is endorsing the looming sell of her home as a recent stressor that is likely contributing to her depressive sx exacerbation. The patient was Dx with MDD with Prozac Rx at  20 mg daily about a year ago from her PCP who is a cardiologist. The Cardiologist recently increased her Prozac to 40 mg daily 2 weeks ago without marked improvement of her depressive sx. Her depressive sx include racing thoughts, decreased concentration, crying spells, anhedonia and increased sleep. Patient is also experiencing decreased appetite with reported 35 pound weight loss over the past month. The patient denies any concurrent substance abuse or use of tobacco products. The patient is denying any hx of anxiety d/o, panic attacks, OCD or PTSD. The patient is also denying any family hx of psychiatric illnesses and or substance  abuse. Patient denies any hx of prior SA and or IP admissions due to psychiatric illness. Patient PM HX  c/w hx Gastric CA, DM2, Obesity and Htn.    Past Psychiatric History: Past Medical History  Diagnosis Date  . stomach ca dx'd 2010    chemo/xrt comp 05/2009  . Hypertension   . Diabetes mellitus   . Asthma     reports that she quit smoking about 25 years ago. Her smoking use included Cigarettes. She started smoking about 40 years ago. She has a 15 pack-year smoking history. She has never used smokeless tobacco. She reports that she does not drink alcohol or use illicit drugs. History reviewed. No pertinent family history.       Allergies:   Allergies  Allergen Reactions  . Codeine Other (See Comments)    paranoid  . Zocor [Simvastatin - High Dose] Other (See Comments)    Muscle spasms  . Penicillins Rash    ACT Assessment Complete:  No:   Past Psychiatric History: Diagnosis:  Mood d/o  Hospitalizations:  N/A  Outpatient Care:  N/A  Substance Abuse Care:  none  Self-Mutilation:  no  Suicidal Attempts:  no  Homicidal Behaviors:  no   Violent Behaviors:  no   Place of Residence:  [place] Marital Status:  divorced Employed/Unemployed:  unknown Education:  unknown Family Supports:  yes Objective: Blood pressure 139/80, pulse 82, temperature 98 F (36.7 C), temperature source Oral, resp. rate 16, height 5' 6"  (1.676  m), weight 111.131 kg (245 lb), SpO2 99.00%.Body mass index is 39.56 kg/(m^2). Results for orders placed during the hospital encounter of 12/09/13 (from the past 72 hour(s))  CBC WITH DIFFERENTIAL     Status: None   Collection Time    12/09/13  4:50 PM      Result Value Ref Range   WBC 6.9  4.0 - 10.5 K/uL   RBC 4.70  3.87 - 5.11 MIL/uL   Hemoglobin 14.0  12.0 - 15.0 g/dL   HCT 40.8  36.0 - 46.0 %   MCV 86.8  78.0 - 100.0 fL   MCH 29.8  26.0 - 34.0 pg   MCHC 34.3  30.0 - 36.0 g/dL   RDW 15.4  11.5 - 15.5 %   Platelets 319  150 - 400 K/uL    Neutrophils Relative % 55  43 - 77 %   Neutro Abs 3.8  1.7 - 7.7 K/uL   Lymphocytes Relative 32  12 - 46 %   Lymphs Abs 2.2  0.7 - 4.0 K/uL   Monocytes Relative 10  3 - 12 %   Monocytes Absolute 0.7  0.1 - 1.0 K/uL   Eosinophils Relative 3  0 - 5 %   Eosinophils Absolute 0.2  0.0 - 0.7 K/uL   Basophils Relative 0  0 - 1 %   Basophils Absolute 0.0  0.0 - 0.1 K/uL  COMPREHENSIVE METABOLIC PANEL     Status: Abnormal   Collection Time    12/09/13  4:50 PM      Result Value Ref Range   Sodium 140  137 - 147 mEq/L   Potassium 4.5  3.7 - 5.3 mEq/L   Chloride 101  96 - 112 mEq/L   CO2 23  19 - 32 mEq/L   Glucose, Bld 156 (*) 70 - 99 mg/dL   Jefferson 22  6 - 23 mg/dL   Creatinine, Ser 1.70 (*) 0.50 - 1.10 mg/dL   Calcium 10.5  8.4 - 10.5 mg/dL   Total Protein 8.3  6.0 - 8.3 g/dL   Albumin 4.3  3.5 - 5.2 g/dL   AST 23  0 - 37 U/L   ALT 23  0 - 35 U/L   Alkaline Phosphatase 67  39 - 117 U/L   Total Bilirubin 0.4  0.3 - 1.2 mg/dL   GFR calc non Af Amer 31 (*) >90 mL/min   GFR calc Af Amer 36 (*) >90 mL/min   Comment: (NOTE)     The eGFR has been calculated using the CKD EPI equation.     This calculation has not been validated in all clinical situations.     eGFR's persistently <90 mL/min signify possible Chronic Kidney     Disease.  SEDIMENTATION RATE     Status: None   Collection Time    12/09/13  4:50 PM      Result Value Ref Range   Sed Rate 15  0 - 22 mm/hr  URINALYSIS, ROUTINE W REFLEX MICROSCOPIC     Status: Abnormal   Collection Time    12/09/13  4:55 PM      Result Value Ref Range   Color, Urine YELLOW  YELLOW   APPearance CLOUDY (*) CLEAR   Specific Gravity, Urine 1.016  1.005 - 1.030   pH 5.0  5.0 - 8.0   Glucose, UA 100 (*) NEGATIVE mg/dL   Hgb urine dipstick NEGATIVE  NEGATIVE   Bilirubin Urine SMALL (*) NEGATIVE   Ketones,  ur 15 (*) NEGATIVE mg/dL   Protein, ur NEGATIVE  NEGATIVE mg/dL   Urobilinogen, UA 1.0  0.0 - 1.0 mg/dL   Nitrite NEGATIVE  NEGATIVE    Leukocytes, UA NEGATIVE  NEGATIVE   Comment: MICROSCOPIC NOT DONE ON URINES WITH NEGATIVE PROTEIN, BLOOD, LEUKOCYTES, NITRITE, OR GLUCOSE <1000 mg/dL.   Labs are reviewed and are pertinent for (No critical lab values noted).  No current facility-administered medications for this encounter.   Current Outpatient Prescriptions  Medication Sig Dispense Refill  . albuterol (PROVENTIL HFA;VENTOLIN HFA) 108 (90 BASE) MCG/ACT inhaler Inhale 2 puffs into the lungs every 4 (four) hours as needed for wheezing or shortness of breath.      Marland Kitchen albuterol (PROVENTIL) (2.5 MG/3ML) 0.083% nebulizer solution Use as directed 2.5 mg in the mouth or throat every 6 (six) hours as needed for wheezing. For shortness of breath      . carvedilol (COREG) 6.25 MG tablet Take 6.25 mg by mouth 2 (two) times daily with a meal.       . Dapagliflozin Propanediol (FARXIGA) 10 MG TABS Take 1 tablet by mouth daily.      Marland Kitchen esomeprazole (NEXIUM) 40 MG capsule Take 40 mg by mouth daily.      Marland Kitchen FLUoxetine (PROZAC) 20 MG capsule Take 20 mg by mouth daily.      Marland Kitchen FLUoxetine (PROZAC) 40 MG capsule Take 1 capsule (40 mg total) by mouth daily.  20 capsule  0  . glimepiride (AMARYL) 4 MG tablet Take 4 mg by mouth 2 (two) times daily.       Marland Kitchen lidocaine-prilocaine (EMLA) cream Apply 1 application topically as needed.  30 g  12  . montelukast (SINGULAIR) 10 MG tablet Take 10 mg by mouth at bedtime.        . Multiple Vitamin (MULTIVITAMIN WITH MINERALS) TABS tablet Take 1 tablet by mouth daily.      . naproxen sodium (ANAPROX) 220 MG tablet Take 440 mg by mouth 2 (two) times daily with a meal.      . Omega-3 Fatty Acids (FISH OIL) 1000 MG CAPS Take 1,000 mg by mouth every morning.       . prochlorperazine (COMPAZINE) 10 MG tablet Take 1 tablet (10 mg total) by mouth every 6 (six) hours as needed for nausea or vomiting.  60 tablet  4  . senna-docusate (SENOKOT-S) 8.6-50 MG per tablet Take 2 tablets by mouth daily.  20 tablet  0  .  sitaGLIPtan-metformin (JANUMET) 50-1000 MG per tablet Take 1 tablet by mouth 2 (two) times daily with a meal.       . traMADol (ULTRAM) 50 MG tablet Take 50 mg by mouth every 8 (eight) hours as needed.      . triamterene-hydrochlorothiazide (DYAZIDE) 37.5-25 MG per capsule Take 1 capsule by mouth daily.         Psychiatric Specialty Exam:     Blood pressure 139/80, pulse 82, temperature 98 F (36.7 C), temperature source Oral, resp. rate 16, height 5' 6"  (1.676 m), weight 111.131 kg (245 lb), SpO2 99.00%.Body mass index is 39.56 kg/(m^2).  General Appearance: Fairly Groomed  Engineer, water::  Good  Speech:  Clear and Coherent  Volume:  Normal  Mood:  Depressed  Affect:  Congruent  Thought Process:  Circumstantial  Orientation:  Full (Time, Place, and Person)  Thought Content:  NA  Suicidal Thoughts:  No  Homicidal Thoughts:  No  Memory:  Immediate;   Good  Judgement:  Good  Insight:  Good  Psychomotor Activity:  Negative  Concentration:  Good  Recall:  Good  Akathisia:  Negative  Handed:  Right  AIMS (if indicated):     Assets:  Desire for Improvement  Sleep:      Treatment Plan Summary: please see plan noted above  Disposition:    Laverle Hobby 12/10/2013 1:11 AM   Patient seen, evaluated and I agree with notes by Nurse Practitioner. Corena Pilgrim, MD

## 2013-12-21 IMAGING — CR DG KNEE COMPLETE 4+V*L*
4 series · 4 of 4 positions shown · non-contrast
Comparison: Left knee radiographs performed 09/09/2009

CLINICAL DATA: Status post fall, with left knee pain and swelling.
Unable to bear weight on left knee.

LEFT KNEE - COMPLETE 4+ VIEW

[x knee ap left (1 of 3)]
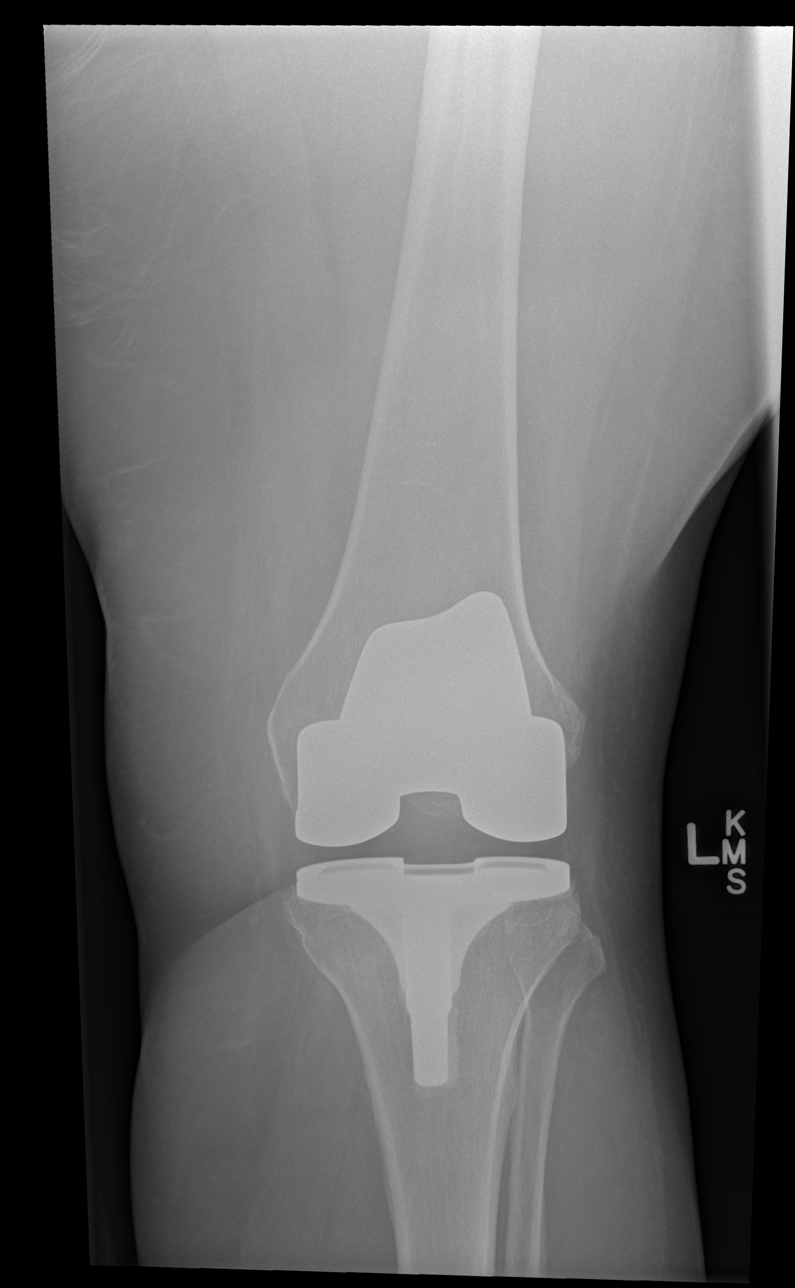

[x knee ap left (2 of 3)]
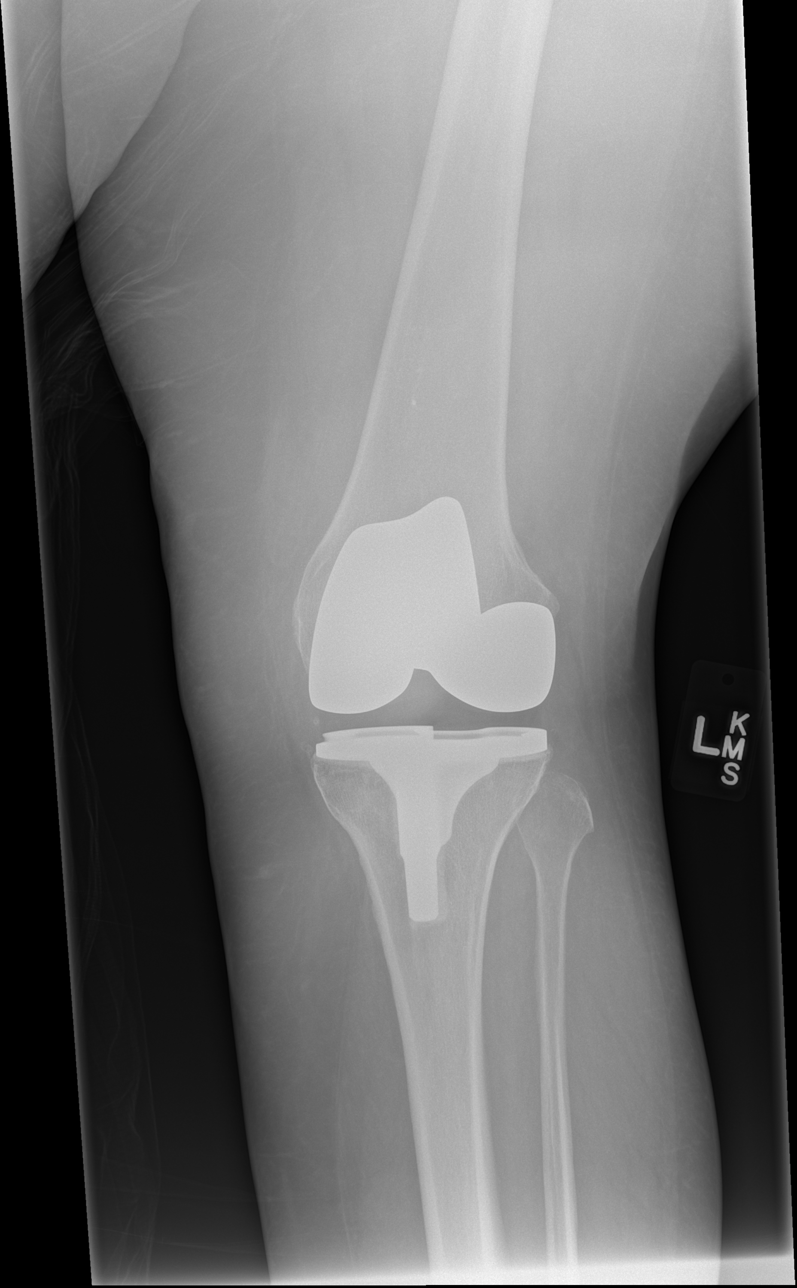

[x knee ap left (3 of 3)]
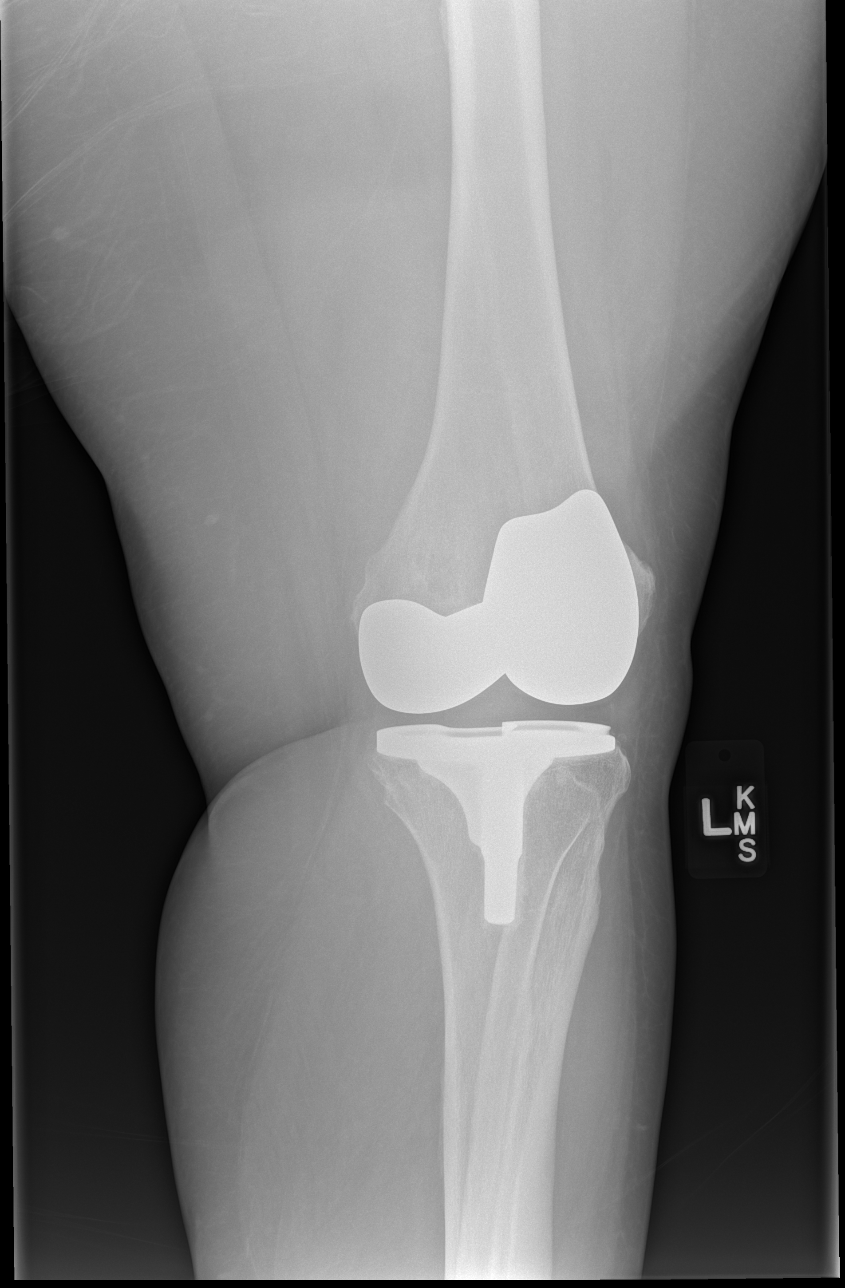

[x knee lat left]
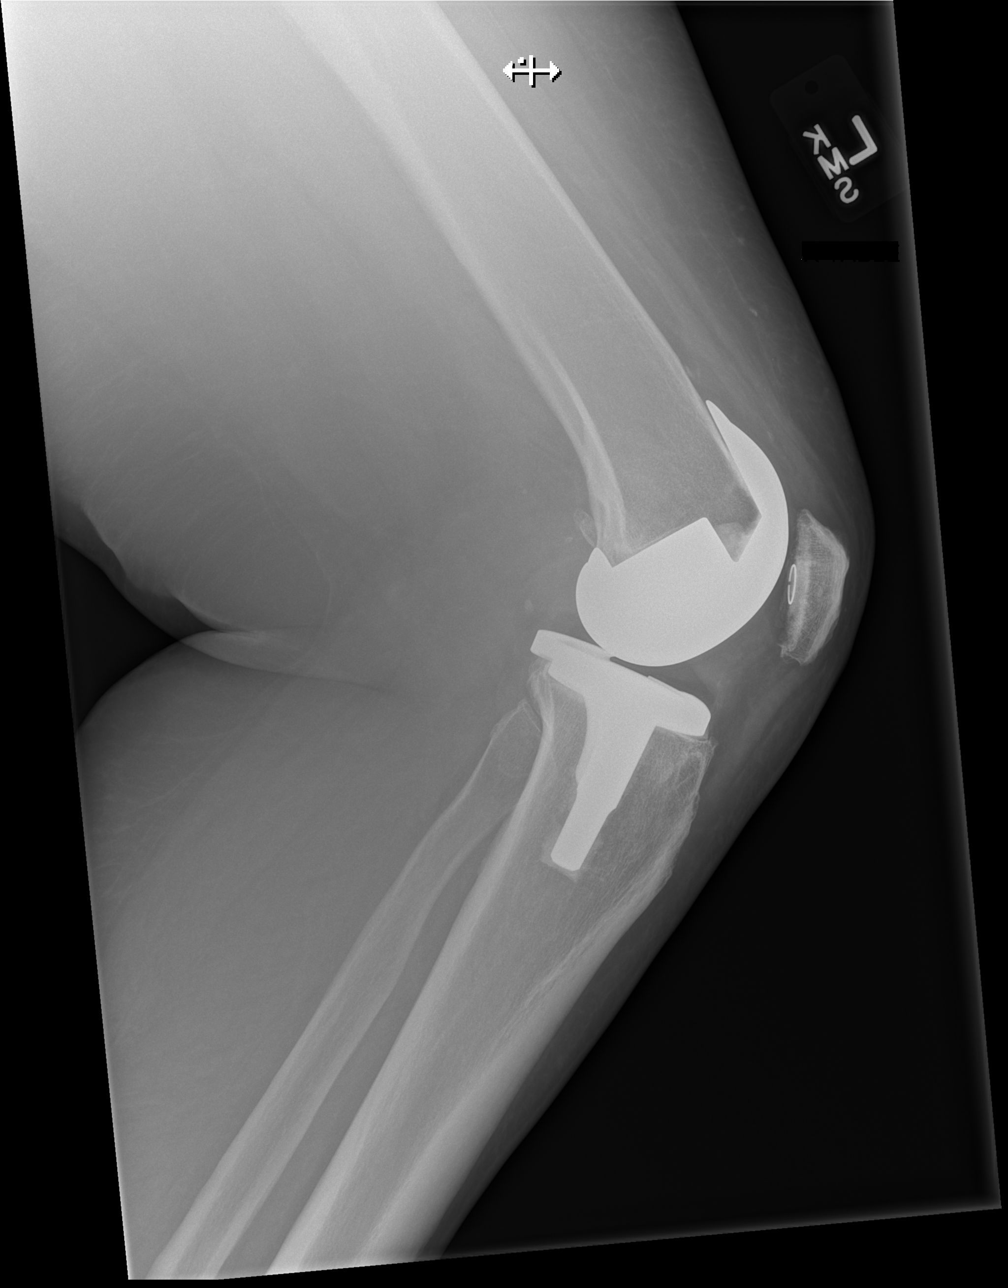

[4 of 4 positions shown; findings below may reference images not displayed]

FINDINGS: There is no evidence of fracture or dislocation.  The
patient's left knee prosthesis is unremarkable in appearance,
without evidence of loosening.  The patellar component is grossly
unremarkable.

No significant joint effusion is seen; there appears to be chronic
edema or scarring at Hoffa's fat pad.  The visualized soft tissues
are otherwise unremarkable in appearance.
IMPRESSION: 1.  No evidence of fracture or dislocation.
2.  Left knee prosthesis appears intact, without evidence of
loosening.
3.  Apparent chronic edema or scarring at Hoffa's fat pad.

## 2013-12-24 ENCOUNTER — Telehealth: Payer: Self-pay | Admitting: Hematology & Oncology

## 2013-12-24 NOTE — Telephone Encounter (Signed)
Patient called and changed 12/27/13 apt time for 8:15 to 2:30

## 2013-12-26 ENCOUNTER — Other Ambulatory Visit: Payer: Self-pay | Admitting: *Deleted

## 2013-12-26 DIAGNOSIS — C169 Malignant neoplasm of stomach, unspecified: Secondary | ICD-10-CM

## 2013-12-27 ENCOUNTER — Ambulatory Visit (HOSPITAL_BASED_OUTPATIENT_CLINIC_OR_DEPARTMENT_OTHER): Payer: Medicare HMO | Admitting: Hematology & Oncology

## 2013-12-27 ENCOUNTER — Encounter: Payer: Self-pay | Admitting: Hematology & Oncology

## 2013-12-27 ENCOUNTER — Ambulatory Visit (HOSPITAL_BASED_OUTPATIENT_CLINIC_OR_DEPARTMENT_OTHER): Payer: Medicare HMO

## 2013-12-27 ENCOUNTER — Ambulatory Visit: Payer: Medicare HMO | Admitting: Hematology & Oncology

## 2013-12-27 ENCOUNTER — Ambulatory Visit (HOSPITAL_BASED_OUTPATIENT_CLINIC_OR_DEPARTMENT_OTHER)
Admission: RE | Admit: 2013-12-27 | Discharge: 2013-12-27 | Disposition: A | Discharge status: Home / Self Care | Payer: Medicare HMO | Source: Ambulatory Visit | Attending: Hematology & Oncology | Admitting: Hematology & Oncology

## 2013-12-27 ENCOUNTER — Other Ambulatory Visit: Payer: Medicare Other | Admitting: Lab

## 2013-12-27 ENCOUNTER — Other Ambulatory Visit (HOSPITAL_BASED_OUTPATIENT_CLINIC_OR_DEPARTMENT_OTHER): Payer: Medicare HMO | Admitting: Lab

## 2013-12-27 VITALS — BP 116/69 | HR 82 | Temp 98.6°F | Resp 16 | Ht 66.0 in | Wt 252.0 lb

## 2013-12-27 DIAGNOSIS — F32A Depression, unspecified: Secondary | ICD-10-CM

## 2013-12-27 DIAGNOSIS — Z96649 Presence of unspecified artificial hip joint: Secondary | ICD-10-CM | POA: Insufficient documentation

## 2013-12-27 DIAGNOSIS — M545 Low back pain, unspecified: Secondary | ICD-10-CM

## 2013-12-27 DIAGNOSIS — E119 Type 2 diabetes mellitus without complications: Secondary | ICD-10-CM

## 2013-12-27 DIAGNOSIS — C169 Malignant neoplasm of stomach, unspecified: Secondary | ICD-10-CM

## 2013-12-27 DIAGNOSIS — Z85028 Personal history of other malignant neoplasm of stomach: Secondary | ICD-10-CM

## 2013-12-27 DIAGNOSIS — IMO0002 Reserved for concepts with insufficient information to code with codable children: Secondary | ICD-10-CM | POA: Insufficient documentation

## 2013-12-27 DIAGNOSIS — R05 Cough: Secondary | ICD-10-CM

## 2013-12-27 DIAGNOSIS — D509 Iron deficiency anemia, unspecified: Secondary | ICD-10-CM

## 2013-12-27 DIAGNOSIS — R0602 Shortness of breath: Secondary | ICD-10-CM

## 2013-12-27 DIAGNOSIS — M5137 Other intervertebral disc degeneration, lumbosacral region: Secondary | ICD-10-CM | POA: Insufficient documentation

## 2013-12-27 DIAGNOSIS — M51379 Other intervertebral disc degeneration, lumbosacral region without mention of lumbar back pain or lower extremity pain: Secondary | ICD-10-CM | POA: Insufficient documentation

## 2013-12-27 DIAGNOSIS — C801 Malignant (primary) neoplasm, unspecified: Secondary | ICD-10-CM

## 2013-12-27 DIAGNOSIS — F3289 Other specified depressive episodes: Secondary | ICD-10-CM

## 2013-12-27 DIAGNOSIS — R110 Nausea: Secondary | ICD-10-CM

## 2013-12-27 DIAGNOSIS — M539 Dorsopathy, unspecified: Secondary | ICD-10-CM

## 2013-12-27 DIAGNOSIS — R059 Cough, unspecified: Secondary | ICD-10-CM

## 2013-12-27 DIAGNOSIS — F329 Major depressive disorder, single episode, unspecified: Secondary | ICD-10-CM

## 2013-12-27 LAB — CBC WITH DIFFERENTIAL (CANCER CENTER ONLY)
BASO#: 0 10*3/uL (ref 0.0–0.2)
BASO%: 0.4 % (ref 0.0–2.0)
EOS%: 5.9 % (ref 0.0–7.0)
Eosinophils Absolute: 0.4 10*3/uL (ref 0.0–0.5)
HCT: 39.6 % (ref 34.8–46.6)
HGB: 13.4 g/dL (ref 11.6–15.9)
LYMPH#: 2 10*3/uL (ref 0.9–3.3)
LYMPH%: 28.2 % (ref 14.0–48.0)
MCH: 29.4 pg (ref 26.0–34.0)
MCHC: 33.8 g/dL (ref 32.0–36.0)
MCV: 87 fL (ref 81–101)
MONO#: 0.7 10*3/uL (ref 0.1–0.9)
MONO%: 9.5 % (ref 0.0–13.0)
NEUT%: 56 % (ref 39.6–80.0)
NEUTROS ABS: 4 10*3/uL (ref 1.5–6.5)
Platelets: 307 10*3/uL (ref 145–400)
RBC: 4.56 10*6/uL (ref 3.70–5.32)
RDW: 15 % (ref 11.1–15.7)
WBC: 7.2 10*3/uL (ref 3.9–10.0)

## 2013-12-27 LAB — COMPREHENSIVE METABOLIC PANEL
ALBUMIN: 3.9 g/dL (ref 3.5–5.2)
ALK PHOS: 63 U/L (ref 39–117)
ALT: 29 U/L (ref 0–35)
AST: 25 U/L (ref 0–37)
BILIRUBIN TOTAL: 0.4 mg/dL (ref 0.2–1.2)
BUN: 14 mg/dL (ref 6–23)
CO2: 27 mEq/L (ref 19–32)
Calcium: 9.5 mg/dL (ref 8.4–10.5)
Chloride: 103 mEq/L (ref 96–112)
Creatinine, Ser: 1.34 mg/dL — ABNORMAL HIGH (ref 0.50–1.10)
Glucose, Bld: 133 mg/dL — ABNORMAL HIGH (ref 70–99)
POTASSIUM: 4.5 meq/L (ref 3.5–5.3)
SODIUM: 142 meq/L (ref 135–145)
TOTAL PROTEIN: 7.1 g/dL (ref 6.0–8.3)

## 2013-12-27 MED ORDER — ARIPIPRAZOLE 2 MG PO TABS: 2.0000 mg | ORAL_TABLET | Freq: Every day | ORAL | Status: DC

## 2013-12-27 MED ORDER — HEPARIN SOD (PORK) LOCK FLUSH 100 UNIT/ML IV SOLN
500.0000 [IU] | Freq: Once | INTRAVENOUS | Status: AC
Start: 2013-12-27 — End: 2013-12-27
  Administered 2013-12-27: 500 [IU] via INTRAVENOUS
  Filled 2013-12-27: qty 5

## 2013-12-27 MED ORDER — METFORMIN HCL 850 MG PO TABS: 850.0000 mg | ORAL_TABLET | Freq: Two times a day (BID) | ORAL | Status: DC

## 2013-12-27 MED ORDER — SODIUM CHLORIDE 0.9 % IJ SOLN
10.0000 mL | INTRAMUSCULAR | Status: DC | PRN
  Administered 2013-12-27: 10 mL via INTRAVENOUS
  Filled 2013-12-27: qty 10

## 2013-12-29 NOTE — Progress Notes (Signed)
Hematology and Oncology Follow Up Visit  [name] [id number] [birth year] 63 y.o. 12/29/2013   Principle Diagnosis:   Stage IB (T1, N1, M0) adenocarcinoma of the stomach Non-insulin-dependent diabetes    Current Therapy:    Observation     Interim History:  Ms.  Jefferson is in for followup. She is very emotional today. She needs to find new doctor. She has a lot of problems at home. There is financial issues. She just has a lot going on.  She is depressed. She does see a psychiatrist.  His lower back issues. We did get some plain x-rays of the lumbar spine. This showed degenerative changes.  Am sure her blood sugars are not all that well controlled.  She has nausea. He has some cough. She is in chronic shortness of breath.  Again, she definitely needs to have a family doctor.  She's had no obvious bleeding. We did give her an on occasion.  She does have a Port-A-Cath in. We will flush this.  Medications: Current outpatient prescriptions:albuterol (PROVENTIL HFA;VENTOLIN HFA) 108 (90 BASE) MCG/ACT inhaler, Inhale 2 puffs into the lungs every 4 (four) hours as needed for wheezing or shortness of breath., Disp: , Rfl: ;  albuterol (PROVENTIL) (2.5 MG/3ML) 0.083% nebulizer solution, Use as directed 2.5 mg in the mouth or throat as needed for wheezing. For shortness of breath, Disp: , Rfl:  carvedilol (COREG) 12.5 MG tablet, Take 12.5 mg by mouth every morning., Disp: , Rfl: ;  Dapagliflozin Propanediol (FARXIGA) 10 MG TABS, Take 1 tablet by mouth daily., Disp: , Rfl: ;  FLUoxetine (PROZAC) 40 MG capsule, Take 1 capsule (40 mg total) by mouth daily., Disp: 20 capsule, Rfl: 0;  glimepiride (AMARYL) 4 MG tablet, Take 4 mg by mouth 2 (two) times daily. , Disp: , Rfl:  lidocaine-prilocaine (EMLA) cream, Apply 1 application topically as needed., Disp: 30 g, Rfl: 12;  montelukast (SINGULAIR) 10 MG tablet, Take 10 mg by mouth at bedtime.  , Disp: , Rfl: ;  Multiple Vitamin (MULTIVITAMIN WITH  MINERALS) TABS tablet, Take 1 tablet by mouth daily., Disp: , Rfl: ;  naproxen sodium (ANAPROX) 220 MG tablet, Take 440 mg by mouth 2 (two) times daily with a meal., Disp: , Rfl:  prochlorperazine (COMPAZINE) 10 MG tablet, Take 1 tablet (10 mg total) by mouth every 6 (six) hours as needed for nausea or vomiting., Disp: 60 tablet, Rfl: 4;  senna-docusate (SENOKOT-S) 8.6-50 MG per tablet, Take 2 tablets by mouth daily., Disp: 20 tablet, Rfl: 0;  traMADol (ULTRAM) 50 MG tablet, Take 50 mg by mouth every 8 (eight) hours as needed., Disp: , Rfl:  triamterene-hydrochlorothiazide (DYAZIDE) 37.5-25 MG per capsule, Take 1 capsule by mouth daily. , Disp: , Rfl: ;  ARIPiprazole (ABILIFY) 2 MG tablet, Take 1 tablet (2 mg total) by mouth daily., Disp: 30 tablet, Rfl: 1;  metFORMIN (GLUCOPHAGE) 850 MG tablet, Take 1 tablet (850 mg total) by mouth 2 (two) times daily with a meal., Disp: 60 tablet, Rfl: 3  Allergies:  Allergies  Allergen Reactions  . Codeine Other (See Comments)    paranoid  . Zocor [Simvastatin - High Dose] Other (See Comments)    Muscle spasms  . Penicillins Rash    Past Medical History, Surgical history, Social history, and Family History were reviewed and updated.  Review of Systems: As above  Physical Exam:  height is 5' 6"  (1.676 m) and weight is 252 lb (114.306 kg). Her oral temperature is 98.6 F (37  C). Her blood pressure is 116/69 and her pulse is 82. Her respiration is 16.   Obese after [place] female. Her head and neck exam shows no ocular or oral lesions. Shows no scleral icterus. Shows no adenopathy in the neck. Lungs are clear. Cardiac exam regular with no murmurs rubs or bruits. Abdomen is soft. Has good bowel sounds. There is no fluid wave is no palpable liver or spleen tip is a well-healed laparotomy scar. I exam no tenderness over the spine. There is some slight tenderness over the right sacroiliac joint. Extremities shows no clubbing cyanosis or edema. Has good range of  motion of her joints. Skin exam shows no rashes ecchymosis or petechia. Neurological exam is non-focal.  Lab Results  Component Value Date   WBC 7.2 12/27/2013   HGB 13.4 12/27/2013   HCT 39.6 12/27/2013   MCV 87 12/27/2013   PLT 307 12/27/2013     Chemistry      Component Value Date/Time   NA 142 12/27/2013 1440   NA 141 11/14/2013 0804   NA 143 03/01/2013 0856   K 4.5 12/27/2013 1440   K 4.6 11/14/2013 0804   K 4.8* 03/01/2013 0856   CL 103 12/27/2013 1440   CL 105 03/01/2013 0856   CO2 27 12/27/2013 1440   CO2 24 11/14/2013 0804   CO2 29 03/01/2013 0856   BUN 14 12/27/2013 1440   BUN 17.8 11/14/2013 0804   BUN 13 03/01/2013 0856   CREATININE 1.34* 12/27/2013 1440   CREATININE 1.7* 11/14/2013 0804   CREATININE 1.2 03/01/2013 0856      Component Value Date/Time   CALCIUM 9.5 12/27/2013 1440   CALCIUM 10.4 11/14/2013 0804   CALCIUM 9.7 03/01/2013 0856   ALKPHOS 63 12/27/2013 1440   ALKPHOS 75 11/14/2013 0804   ALKPHOS 75 03/01/2013 0856   AST 25 12/27/2013 1440   AST 16 11/14/2013 0804   AST 16 03/01/2013 0856   ALT 29 12/27/2013 1440   ALT 14 11/14/2013 0804   ALT 16 03/01/2013 0856   BILITOT 0.4 12/27/2013 1440   BILITOT 0.38 11/14/2013 0804   BILITOT 0.60 03/01/2013 0856         Impression and Plan: [name] is certainly no after [place] female with history of stage I stomach cancer. She underwent resection. She did have a positive lymph node. She did get adjuvant radiation chemotherapy.  She is over 5 years out from treatment. I really do not think that this is going to be a problem for her. I think recurrence would be less than 10%.  Again she is all his health issues that she really needs her family doctor for.  We'll see about refer her to one of the doctors in our office building.  We will see if she can improve with Abilify. This can be added to the Prozac. We'll give her 2 mg a day. However, she really needs to have psychiatry deal with this.  We probably don't need to give her any iron. We will see what  her iron studies show. Her hemoglobin is certainly doing fairly well.  I will plan to get her back in another 6-8 weeks.  I spent over half hour with her today trying to help her out and trying to listen to all of the issues that she has with respect to her family. I do feel that for her. I will pray hard for her.     Volanda Napoleon, MD 6/7/20151:21 PM

## 2013-12-30 ENCOUNTER — Encounter: Payer: Self-pay | Admitting: *Deleted

## 2013-12-30 LAB — IRON AND TIBC CHCC
%SAT: 41 % (ref 21–57)
Iron: 94 ug/dL (ref 41–142)
TIBC: 229 ug/dL — ABNORMAL LOW (ref 236–444)
UIBC: 134 ug/dL (ref 120–384)

## 2013-12-30 LAB — FERRITIN CHCC: Ferritin: 620 ng/ml — ABNORMAL HIGH (ref 9–269)

## 2013-12-31 ENCOUNTER — Encounter: Payer: Self-pay | Admitting: *Deleted

## 2014-01-02 ENCOUNTER — Encounter: Payer: Self-pay | Admitting: Internal Medicine

## 2014-01-02 ENCOUNTER — Ambulatory Visit (INDEPENDENT_AMBULATORY_CARE_PROVIDER_SITE_OTHER): Payer: Medicare HMO | Admitting: Internal Medicine

## 2014-01-02 VITALS — BP 109/74 | HR 85 | Temp 98.4°F | Resp 18 | Ht 67.0 in | Wt 252.0 lb

## 2014-01-02 DIAGNOSIS — J45909 Unspecified asthma, uncomplicated: Secondary | ICD-10-CM

## 2014-01-02 DIAGNOSIS — M1990 Unspecified osteoarthritis, unspecified site: Secondary | ICD-10-CM

## 2014-01-02 DIAGNOSIS — F329 Major depressive disorder, single episode, unspecified: Secondary | ICD-10-CM

## 2014-01-02 DIAGNOSIS — E119 Type 2 diabetes mellitus without complications: Secondary | ICD-10-CM

## 2014-01-02 DIAGNOSIS — F3289 Other specified depressive episodes: Secondary | ICD-10-CM

## 2014-01-02 DIAGNOSIS — C169 Malignant neoplasm of stomach, unspecified: Secondary | ICD-10-CM

## 2014-01-02 DIAGNOSIS — K219 Gastro-esophageal reflux disease without esophagitis: Secondary | ICD-10-CM

## 2014-01-02 DIAGNOSIS — E785 Hyperlipidemia, unspecified: Secondary | ICD-10-CM

## 2014-01-02 DIAGNOSIS — I10 Essential (primary) hypertension: Secondary | ICD-10-CM

## 2014-01-02 DIAGNOSIS — F32A Depression, unspecified: Secondary | ICD-10-CM

## 2014-01-02 DIAGNOSIS — G4733 Obstructive sleep apnea (adult) (pediatric): Secondary | ICD-10-CM

## 2014-01-02 NOTE — Progress Notes (Signed)
Subjective:    Patient ID: [name], female    DOB: [birth year], 63 y.o.   MRN: [id number]  HPI  [name] is a new pt here for primary care first visit.    Extensive PMH that includes  Longstanding DM (on triple therapy with max dose Metformin, Amaryl and Farxiga ),  Recent depression  ( see ER note of 5/18),  ,  HTN,  Hyperlipidemia, GERD,  OSA (not using CPAP),  Advanced DJD, and asthma .  Depression : was seen in ER 5/18 with depression Pt staes "cryng all the time".   Was evaluated by telepsych  consult and  Felt not  to be appropriate for admission.   She was advised to follow up with outpt BH but pt tells me she has never received an appt..  She recently has seen Dr. Marin Olp who added low dose Abilify to her regimen.  Pt states she her mood is better.  No S/H ideation, plan or intent.  Never been hospitalized for mental illness.  She does have lost of stresstors in her life with 3 adult children living in the home with her and grandchildren .  One adult child lives independently.   Finances are a stressor for her  Diabetes:  Complicated by retinopathy (see has recently seen Dr. Zadie Rhine)  She is on max dose triple therapy .  Last A1c above 8.2.  I am not sure she is taking [place]  She does have an endocrinologist.   Asthma  Has albuterol MDI but not used in quite some time.  Is also on singulair.    HTN  Well controlled on Coreg and Dyazide  Hyperlipidemia  Last ldl one year ago 123   Total cholesterol below 200  Advanced DJD  Two joint replacements  She uses tramadol occasionally   OSA  Hates her CPAP and does not use  Adenoca of stomach.  S/P resection with one positive node.  Now 5 years post adjuvant CTX and XRT.       Allergies  Allergen Reactions  . Codeine Other (See Comments)    paranoid  . Zocor [Simvastatin - High Dose] Other (See Comments)    Muscle spasms  . Penicillins Rash   Past Medical History  Diagnosis Date  . stomach ca dx'd 2010    chemo/xrt comp  05/2009  . Hypertension   . Diabetes mellitus   . Asthma    Past Surgical History  Procedure Laterality Date  . Total knee arthroplasty      Left  . Total hip arthroplasty      Right  . Shoulder surgery      Left  . Appendectomy    . Cesarean section    . Hernia repair    . Colon surgery    . Esophagogastroduodenoscopy N/A 10/15/2012    Procedure: ESOPHAGOGASTRODUODENOSCOPY (EGD);  Surgeon: Lear Ng, MD;  Location: [facility];  Service: Endoscopy;  Laterality: N/A;   History   Social History  . Marital Status: Divorced    Spouse Name: N/A    Number of Children: N/A  . Years of Education: N/A   Occupational History  . Not on file.   Social History Main Topics  . Smoking status: Former Smoker -- 1.00 packs/day for 15 years    Types: Cigarettes    Start date: 09/27/1973    Quit date: 10/15/1988  . Smokeless tobacco: Never Used     Comment: quit smoking 14 years ago  .  Alcohol Use: No  . Drug Use: No  . Sexual Activity: No   Other Topics Concern  . Not on file   Social History Narrative  . No narrative on file   Family History  Problem Relation Age of Onset  . Cancer Mother   . Alzheimer's disease Father   . Diabetes Brother   . Diabetes Maternal Grandmother   . Cancer Maternal Grandfather     lung   Patient Active Problem List   Diagnosis Date Noted  . Nausea 10/15/2012  . ADENOCARCINOMA, STOMACH 12/10/2008  . CHRONIC RESPIRATORY FAILURE 01/03/2008  . DIABETES MELLITUS, TYPE II 06/27/2007  . HYPERLIPIDEMIA 06/27/2007  . OBESITY, MORBID 06/27/2007  . SLEEP APNEA, OBSTRUCTIVE 06/27/2007  . HYPERTENSION 06/27/2007  . ASTHMA 06/27/2007  . OSTEOARTHRITIS 06/27/2007   Current Outpatient Prescriptions on File Prior to Visit  Medication Sig Dispense Refill  . albuterol (PROVENTIL HFA;VENTOLIN HFA) 108 (90 BASE) MCG/ACT inhaler Inhale 2 puffs into the lungs every 4 (four) hours as needed for wheezing or shortness of breath.      Marland Kitchen albuterol  (PROVENTIL) (2.5 MG/3ML) 0.083% nebulizer solution Use as directed 2.5 mg in the mouth or throat as needed for wheezing. For shortness of breath      . ARIPiprazole (ABILIFY) 2 MG tablet Take 1 tablet (2 mg total) by mouth daily.  30 tablet  1  . carvedilol (COREG) 12.5 MG tablet Take 12.5 mg by mouth every morning.      . Dapagliflozin Propanediol (FARXIGA) 10 MG TABS Take 1 tablet by mouth daily.      Marland Kitchen FLUoxetine (PROZAC) 40 MG capsule Take 1 capsule (40 mg total) by mouth daily.  20 capsule  0  . glimepiride (AMARYL) 4 MG tablet Take 4 mg by mouth 2 (two) times daily.       Marland Kitchen lidocaine-prilocaine (EMLA) cream Apply 1 application topically as needed.  30 g  12  . metFORMIN (GLUCOPHAGE) 850 MG tablet Take 1 tablet (850 mg total) by mouth 2 (two) times daily with a meal.  60 tablet  3  . montelukast (SINGULAIR) 10 MG tablet Take 10 mg by mouth at bedtime.        . Multiple Vitamin (MULTIVITAMIN WITH MINERALS) TABS tablet Take 1 tablet by mouth daily.      . naproxen sodium (ANAPROX) 220 MG tablet Take 440 mg by mouth 2 (two) times daily with a meal.      . prochlorperazine (COMPAZINE) 10 MG tablet Take 1 tablet (10 mg total) by mouth every 6 (six) hours as needed for nausea or vomiting.  60 tablet  4  . senna-docusate (SENOKOT-S) 8.6-50 MG per tablet Take 2 tablets by mouth daily.  20 tablet  0  . traMADol (ULTRAM) 50 MG tablet Take 50 mg by mouth every 8 (eight) hours as needed.      . triamterene-hydrochlorothiazide (DYAZIDE) 37.5-25 MG per capsule Take 1 capsule by mouth daily.        No current facility-administered medications on file prior to visit.      Review of Systems See HPI    Objective:   Physical Exam Physical Exam  Nursing note and vitals reviewed.  Constitutional: She is oriented to person, place, and time. She appears well-developed and well-nourished.  HENT:  Head: Normocephalic and atraumatic.  Cardiovascular: Normal rate and regular rhythm. Exam reveals no gallop  and no friction rub.  No murmur heard.  Pulmonary/Chest: Breath sounds normal. She has no wheezes. She  has no rales.  Neurological: She is alert and oriented to person, place, and time.  Skin: Skin is warm and dry.  Psychiatric: She has a normal mood and affect. Her behavior is normal.   Normal affect today smiling at times            Assessment & Plan:  HTN  Continue meds    Hyperlipidemia will check today  Diabetes she has not been under good control for quite some time.  She had seen Dr. [place] Planas .  She was hesitant to see a new endocrinologist at appt today.  Will get [facility] when she has her next blood draw.  She is already having opthalmologic complications.    I am not sure she is taking all 3 meds .  I have asked her to bring all pill bottle next visit  Depression  Will refer to [facility] behavioral health.  Continue Prozac and Ability for now  Advanced DJD  Tramadol OK for now  Asthma  Continue rescue inhaler   OSA  She will not use CPAP  AdenoCA of stomach   Labs ordered,  Bring all meds next visit.  Further management based on results.

## 2014-01-04 NOTE — Patient Instructions (Signed)
Will set up referral to behavioral health  Bring all medications bottles next visit  See me in 4-6weeks  Be sure to have labs done before then

## 2014-01-23 IMAGING — CR DG ABDOMEN ACUTE W/ 1V CHEST
4 series · 4 of 4 positions shown · non-contrast
Comparison: 07/02/2012 CT scan

CLINICAL DATA: Nausea.  Vomiting.  Abdominal pain.  History of
gastric cancer.  Asthma.

ACUTE ABDOMEN SERIES (ABDOMEN 2 VIEW & CHEST 1 VIEW)

[w chest pa]
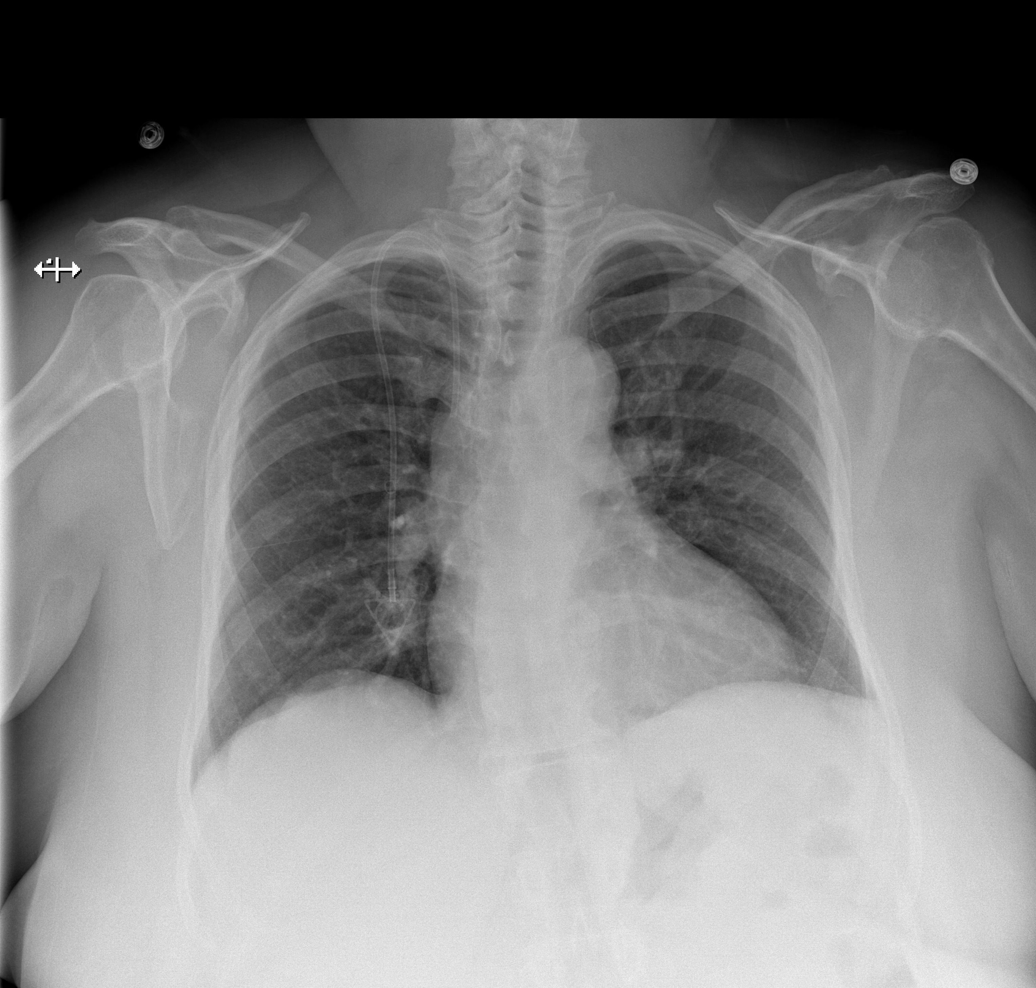

[w abdomen upright]
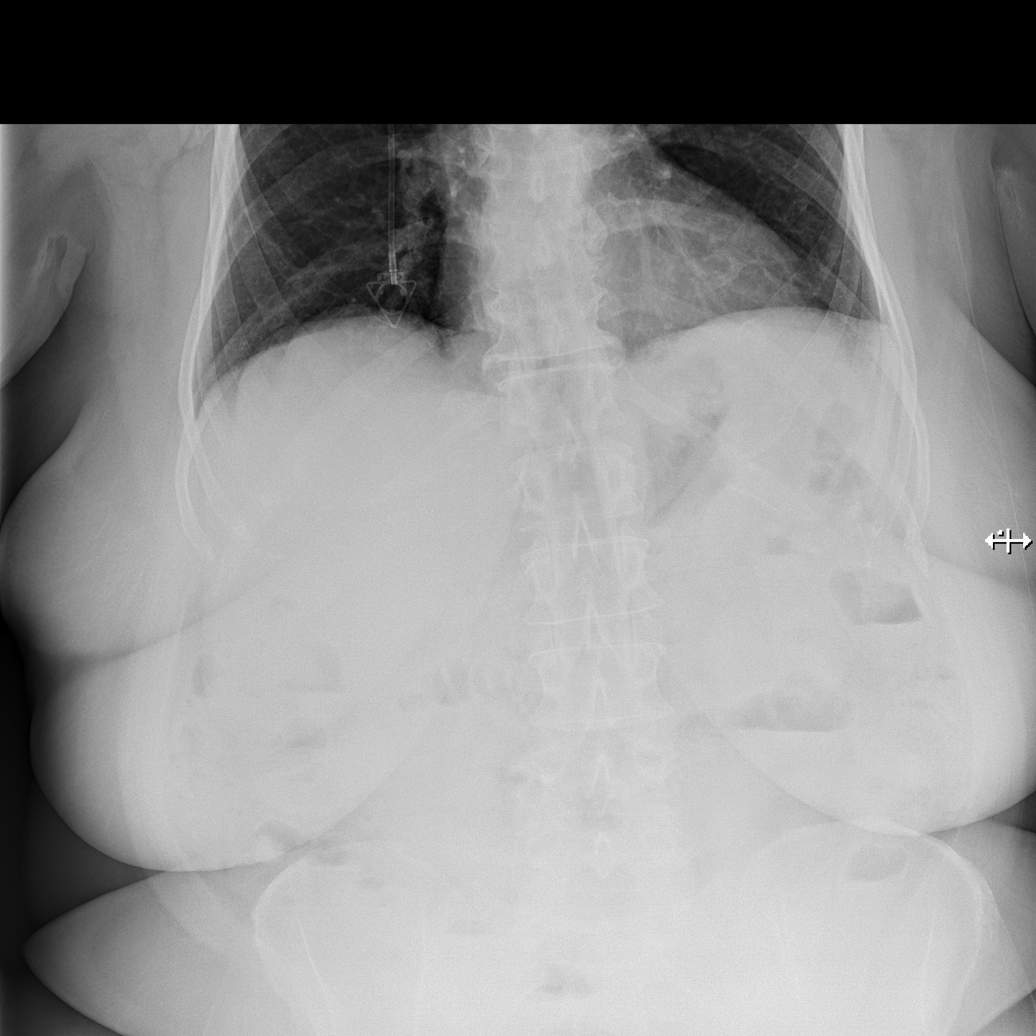

[t abdomen supine (1 of 2)]
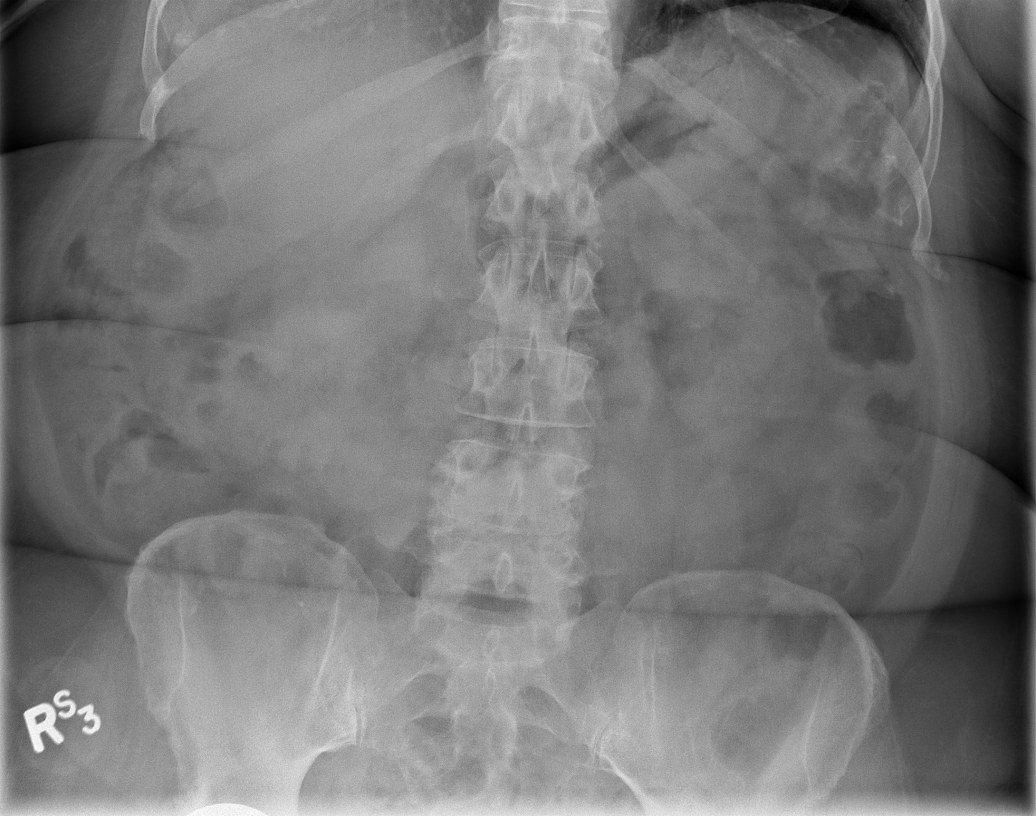

[t abdomen supine (2 of 2)]
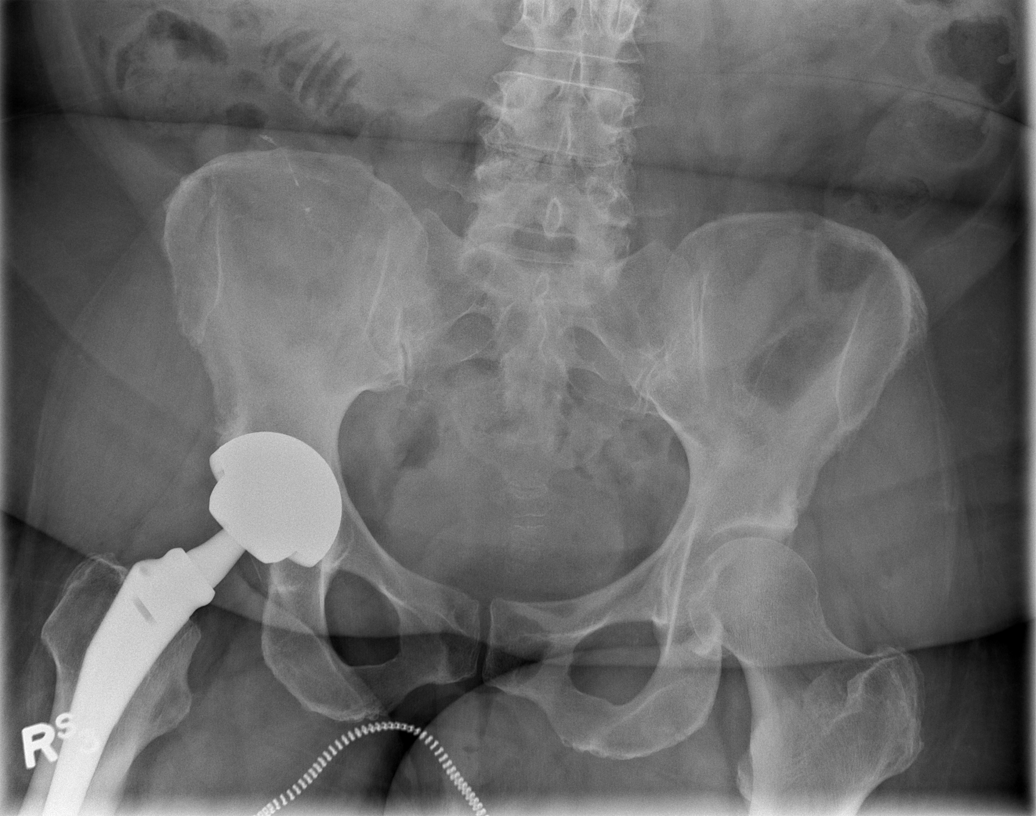

[4 of 4 positions shown; findings below may reference images not displayed]

FINDINGS: Right power port catheter tip projects over the SVC.
Mild cardiomegaly noted with cardiothoracic index 53%.  No free
peroneal gas is present beneath the hemidiaphragms.  Thoracic
spondylosis noted.

Several air-fluid levels are present in nondilated loops of small
bowel.  Air-fluid levels noted in the left colon.

No dilated bowel noted.  Right hip arthroplasty noted.
IMPRESSION: 1.  Air-fluid levels in the descending colon may reflect diarrheal
process.
2.  Air-fluid levels in nondilated loops of small bowel are
probably incidental but could reflect enteritis.
3.  Thoracic spondylosis.
4.  Mild cardiomegaly.

## 2014-01-24 IMAGING — CT CT ABD-PELV W/ CM
2 of 5 series · 16 of 46 positions shown, 18 images · IV contrast (omnipaque)
Comparison: CT of the abdomen pelvis 07/02/2012.

CLINICAL DATA: Mild pain, fever and diarrhea.

CT ABDOMEN AND PELVIS WITH CONTRAST
TECHNIQUE: Multidetector CT imaging of the abdomen and pelvis was
performed following the standard protocol during bolus
administration of intravenous contrast.
Contrast: 50mL OMNIPAQUE IOHEXOL 300 MG/ML  SOLN, 100mL OMNIPAQUE
IOHEXOL 300 MG/ML  SOLN

[Series 2: abd/pel with · axial · 0.92mm/px · z∈[+1199,+1604]mm · 13 of 91 slices shown, 15 images]
[im 5/91  soft-tissue]
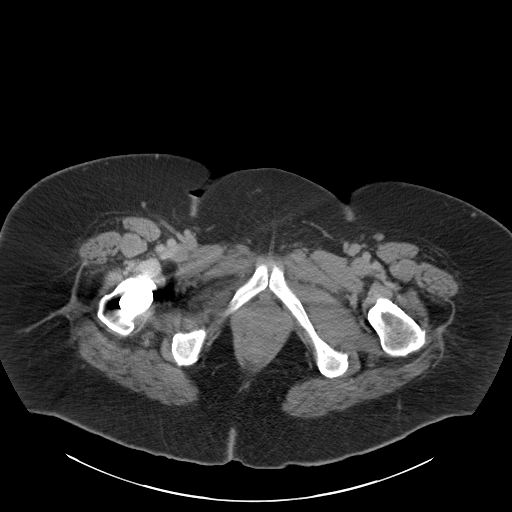
[im 5/91  bone]
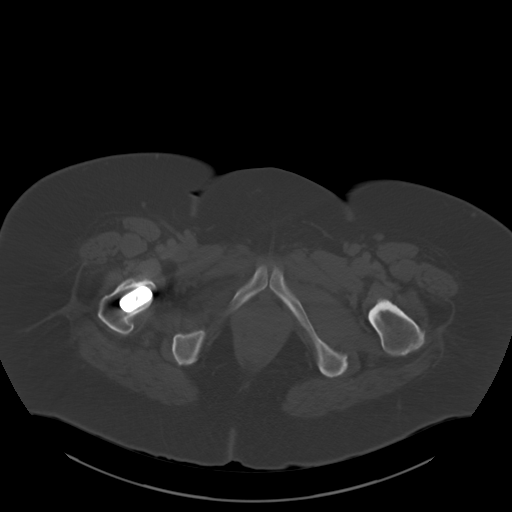
[im 15/91  soft-tissue]
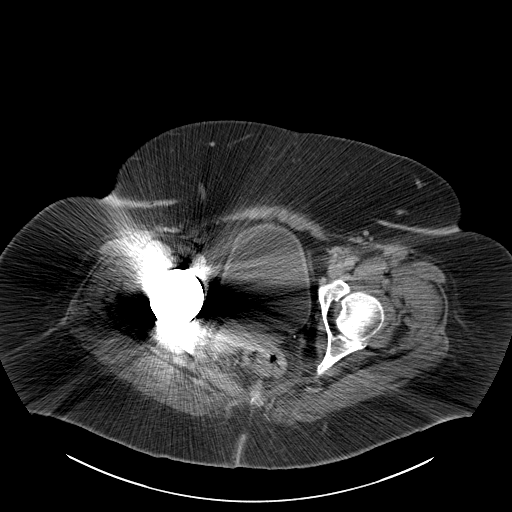
[im 19/91  soft-tissue]
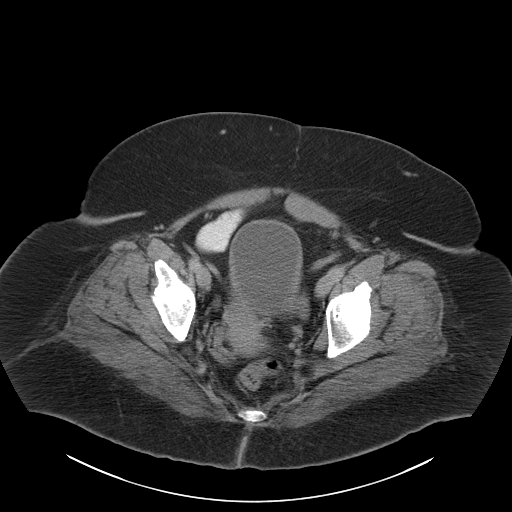
[im 24/91  soft-tissue]
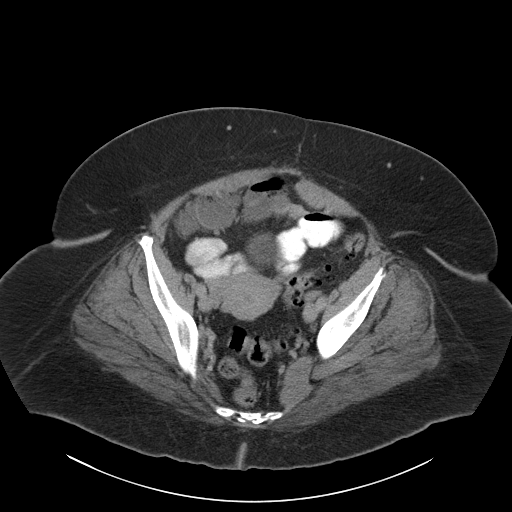
[im 34/91  soft-tissue]
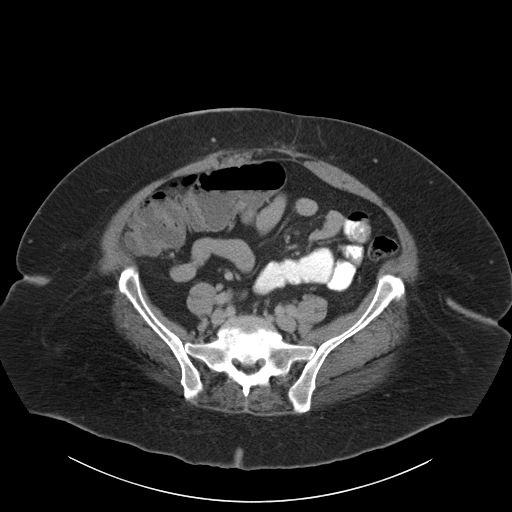
[im 38/91  soft-tissue]
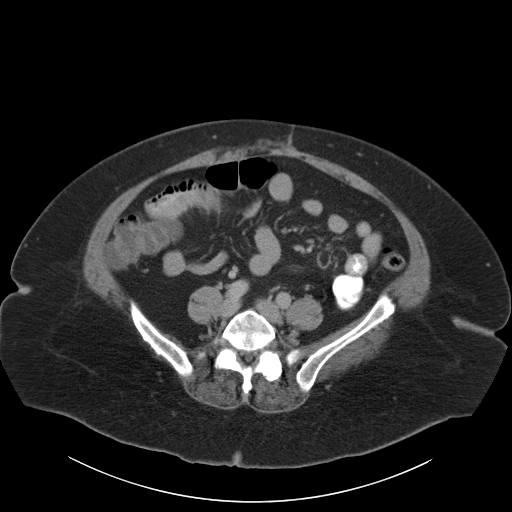
[im 48/91  soft-tissue]
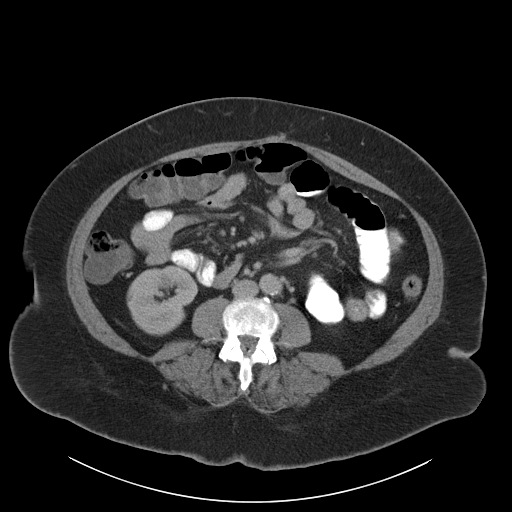
[im 53/91  soft-tissue]
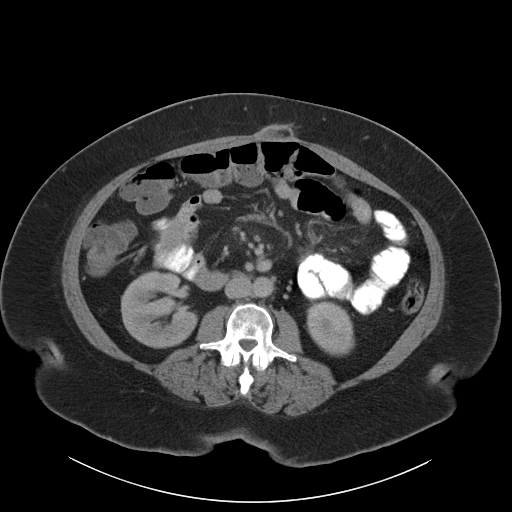
[im 57/91  soft-tissue]
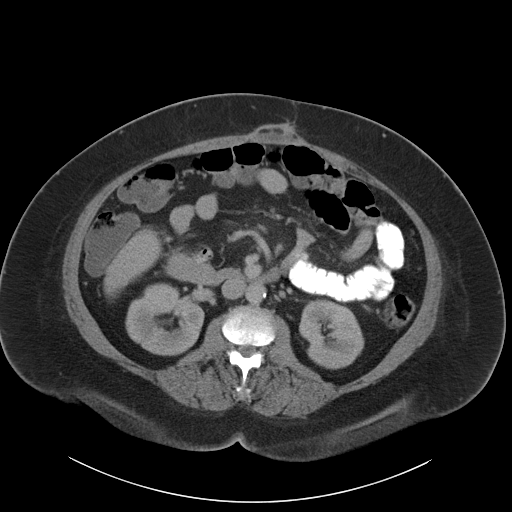
[im 57/91  bone]
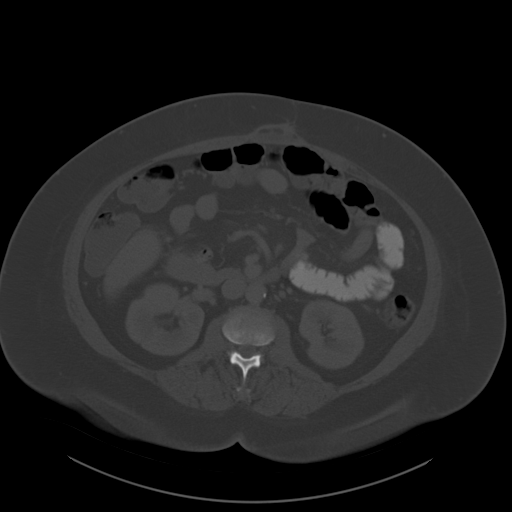
[im 67/91  soft-tissue]
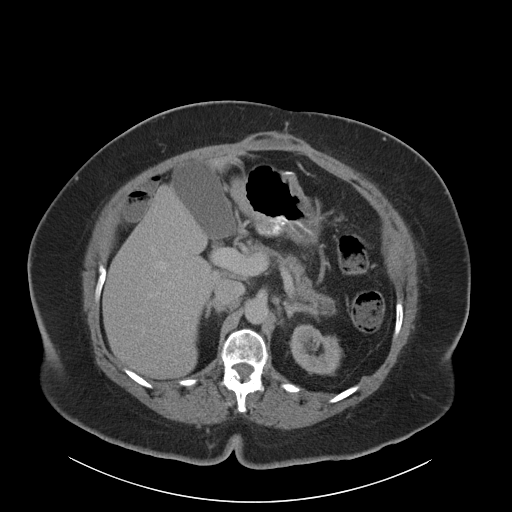
[im 72/91  soft-tissue]
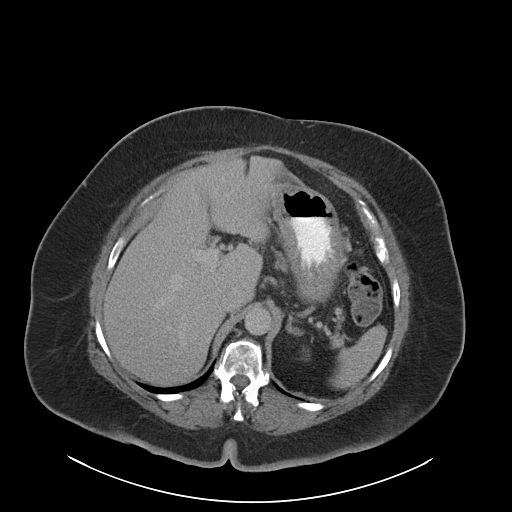
[im 76/91  soft-tissue]
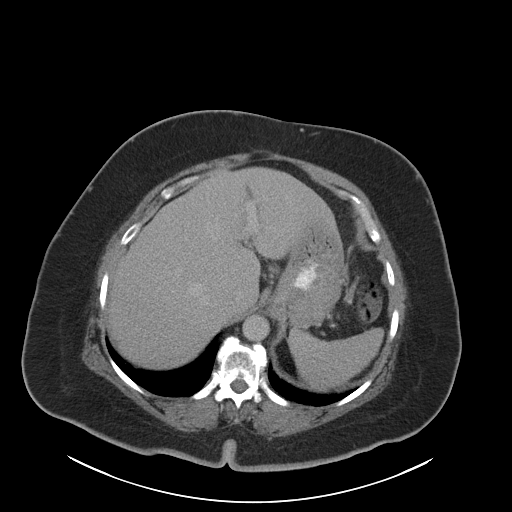
[im 86/91  soft-tissue]
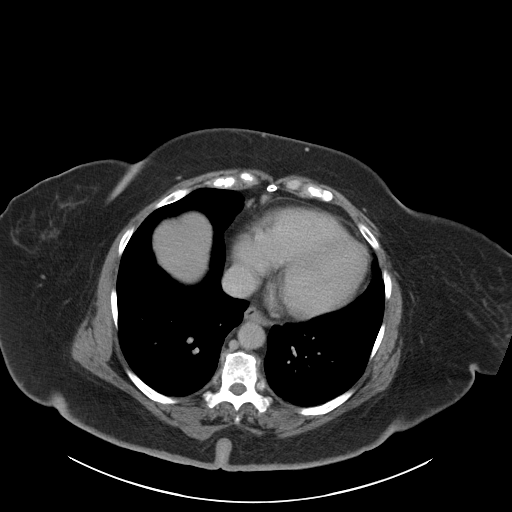

[Series 4: coronal a/|p · coronal · 0.89mm/px · 3 of 113 slices shown]
[im 38/113  soft-tissue]
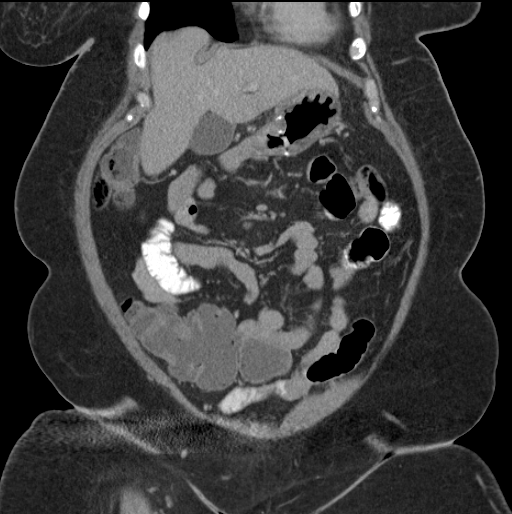
[im 50/113  soft-tissue]
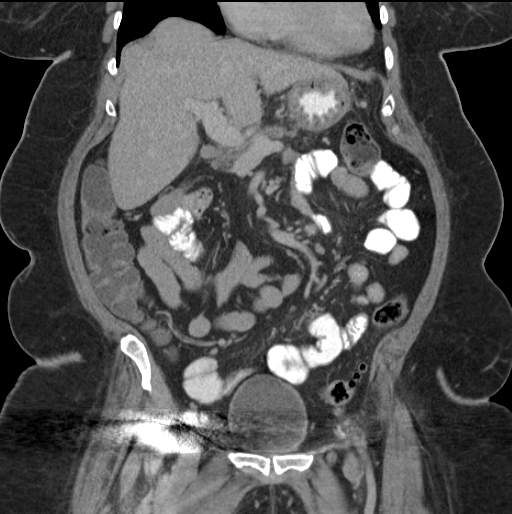
[im 63/113  soft-tissue]
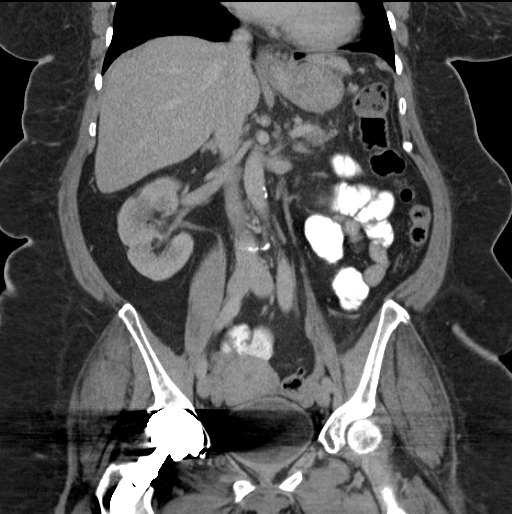

[16 of 46 positions shown; findings below may reference images not displayed]

FINDINGS: Lung Bases: Small hiatal hernia.  Otherwise, unremarkable.

Abdomen/Pelvis:  Postoperative changes of distal gastrectomy and
gastrojejunostomy are again noted.  The appearance of the liver,
gallbladder, spleen, bilateral adrenal glands and bilateral kidneys
is unremarkable.  The pancreas is atrophic, but otherwise
unremarkable in appearance.  Atherosclerosis throughout the
abdominal and pelvic vasculature, without definite aneurysm.  No
significant volume of ascites.  No pneumoperitoneum.  There is some
borderline dilated and mildly dilated loops of small bowel
measuring up to 3.2 cm in diameter, predominantly in the left side
of the abdomen.  No definite pathologic lymphadenopathy identified
within the abdomen or pelvis.  Assessment of the pelvis is slightly
limited by extensive beam hardening artifact from the patient's
right-sided total hip arthroplasty.  The uterus and ovaries are
unremarkable in appearance.  Urinary bladder is also unremarkable
in appearance.

Musculoskeletal: Postoperative changes are noted in the anterior
abdominal wall with a small chronic postoperative fluid collection
in which measures up to approximately 4.2 x 1.0 cm (image 27 of
series 2), favored to represent a chronic postoperative seroma.
There are no aggressive appearing lytic or blastic lesions noted in
the visualized portions of the skeleton.
IMPRESSION: 1.  Multiple borderline dilated and mildly dilated loops of small
bowel.  This may be chronic in this patient, however, the
possibility of an early or partial small bowel obstruction is not
entirely excluded.  Clinical correlation is recommended, with
consideration for followup with routine abdominal radiographs if
the patient should developed worsening symptoms.
2.  Status post distal gastrectomy and gastrojejunostomy.
3.  Small hiatal hernia.
4.  Additional postoperative findings, as above, similar to prior
studies.
5.  Small hiatal hernia.

## 2014-01-25 IMAGING — CR DG ABDOMEN ACUTE W/ 1V CHEST
4 series · 4 of 4 positions shown · non-contrast
Comparison: CT 10/12/2012

CLINICAL DATA: Abdominal pain, partial small bowel obstruction

ACUTE ABDOMEN SERIES (ABDOMEN 2 VIEW & CHEST 1 VIEW)

[w chest pa]
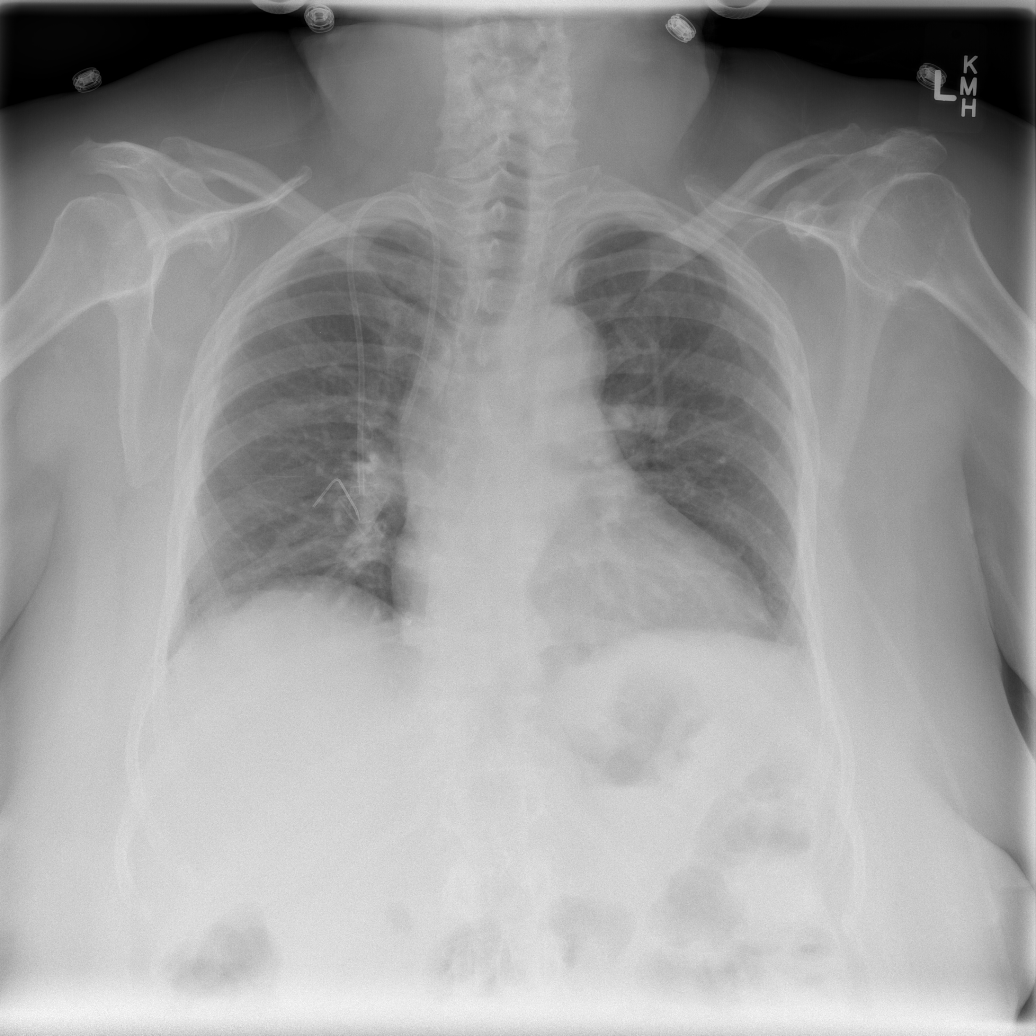

[w abdomen upright *]
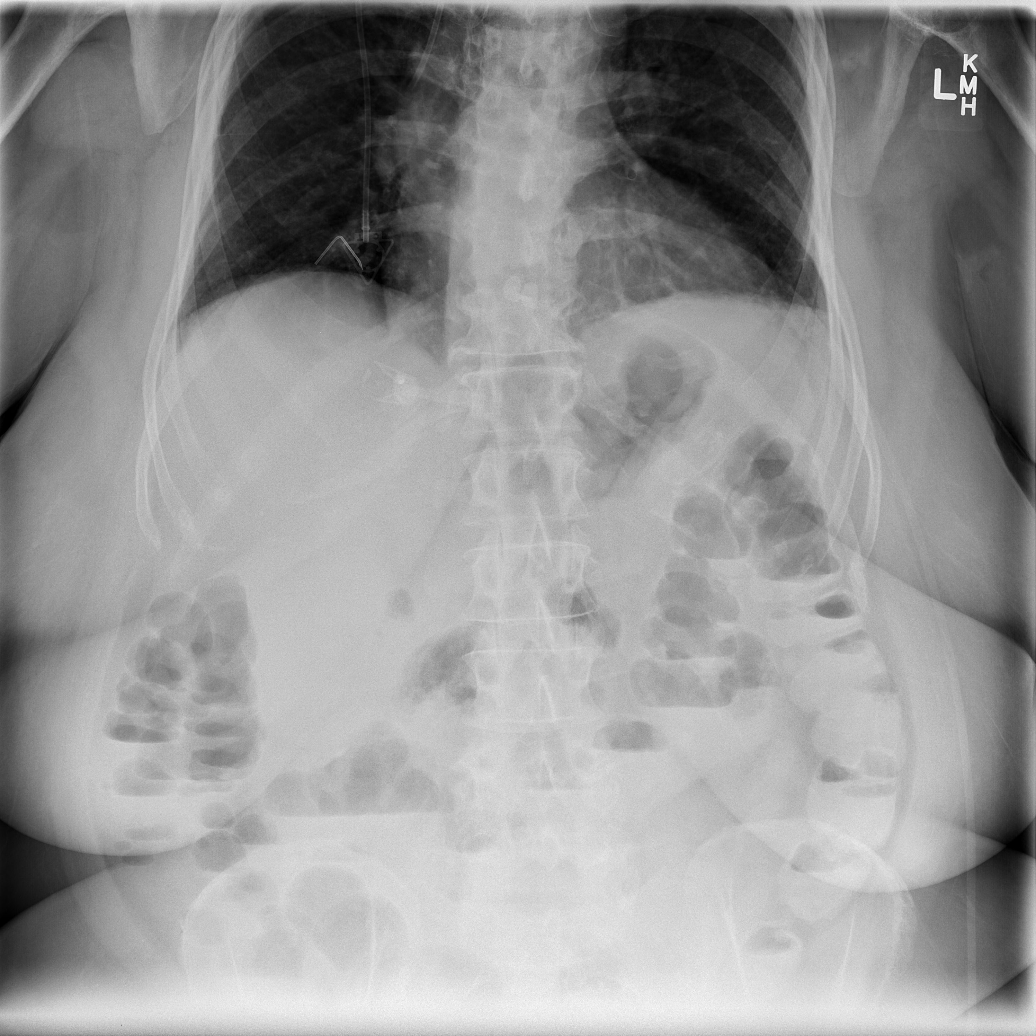

[t abdomen supine (1 of 2)]
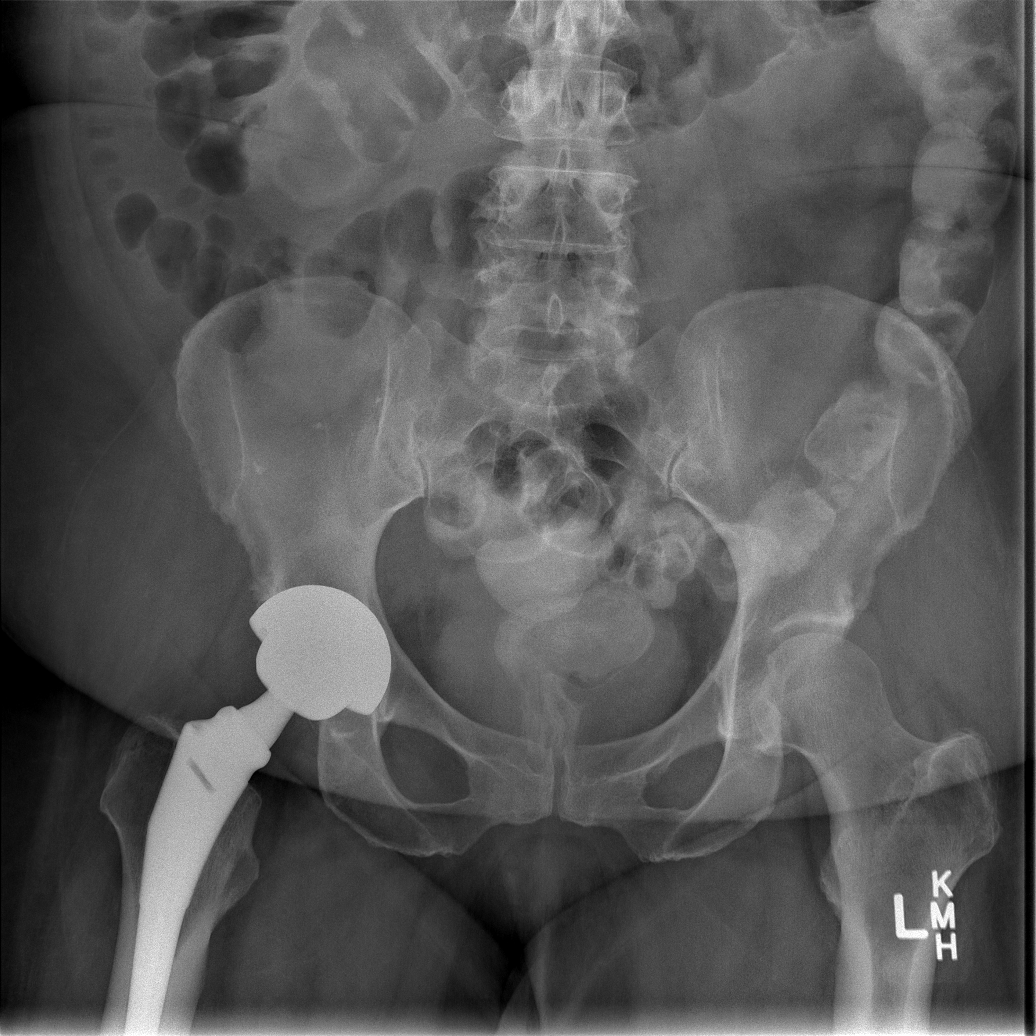

[t abdomen supine (2 of 2)]
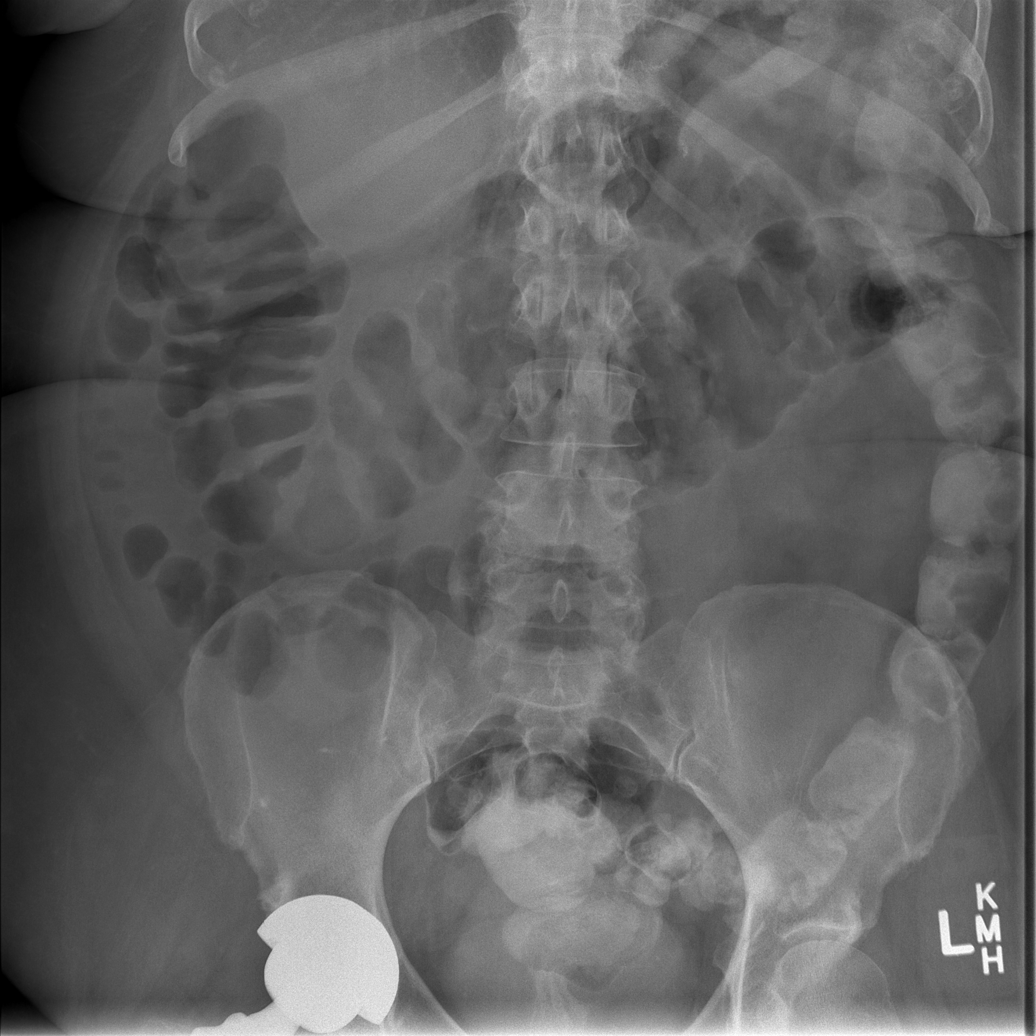

[4 of 4 positions shown; findings below may reference images not displayed]

FINDINGS: Right IJ port catheter stable.  Relatively low lung
volumes.  Heart size upper limits normal.  No focal infiltrate.  No
effusion.  No free air.  The small bowel is relatively
decompressed.  There is some residual oral contrast material in the
colon.  Multiple fluid levels are noted primarily in the colon on
the erect radiograph.  No free air.  Right hip arthroplasty
hardware partially seen.
IMPRESSION: Fluid levels in nondilated colon.  No evidence of obstruction or
free air.

## 2014-01-28 ENCOUNTER — Ambulatory Visit (INDEPENDENT_AMBULATORY_CARE_PROVIDER_SITE_OTHER): Payer: Medicare HMO | Admitting: Psychiatry

## 2014-01-28 ENCOUNTER — Encounter (HOSPITAL_COMMUNITY): Payer: Self-pay | Admitting: Psychiatry

## 2014-01-28 VITALS — BP 116/75 | HR 84 | Ht 66.0 in | Wt 247.0 lb

## 2014-01-28 DIAGNOSIS — F331 Major depressive disorder, recurrent, moderate: Secondary | ICD-10-CM | POA: Insufficient documentation

## 2014-01-28 DIAGNOSIS — F329 Major depressive disorder, single episode, unspecified: Secondary | ICD-10-CM

## 2014-01-28 MED ORDER — MIRTAZAPINE 15 MG PO TABS: 15.0000 mg | ORAL_TABLET | Freq: Every day | ORAL | Status: DC

## 2014-01-28 MED ORDER — FLUOXETINE HCL 20 MG PO CAPS: 60.0000 mg | ORAL_CAPSULE | Freq: Every day | ORAL | Status: DC

## 2014-01-28 NOTE — Progress Notes (Signed)
Psychiatric Assessment Adult  Patient Identification:  [name] Date of Evaluation:  01/28/2014 Chief Complaint: depression  History of Chief Complaint:   Chief Complaint  Patient presents with  . Depression    HPI Comments: Pt states her mind and body don't feel right. Pt is reporting daily low mood and anhedonia. Pt is endorsing frequent crying spells. Pt is having hopelessness and worthlessness. Pt reports low motivation and low energy. Appetite and concentration are poor. Sleep is broke and she is staying in bed until 3-4 pm. Symptoms have been going on for 2 months. Pt denies SI/HI. Pt has been taking Abilify for 1 month and she denies SE. Pt is not sure if the Abilify is helping.  Pt has been taking Prozac 67m for 2 months and she denies SE. Prozac helps to decrease her anxiety.  Review of Systems  Constitutional: Positive for activity change, appetite change and fatigue.  HENT: Negative.   Eyes: Negative.   Respiratory: Negative.   Cardiovascular: Negative.   Gastrointestinal: Positive for abdominal distention.  Musculoskeletal: Positive for arthralgias and joint swelling.  Skin: Negative.   Neurological: Negative.   Psychiatric/Behavioral: Positive for sleep disturbance and dysphoric mood. The patient is nervous/anxious.    Physical Exam  Psychiatric: Her speech is normal and behavior is normal. Judgment and thought content normal. Cognition and memory are normal. She exhibits a depressed mood.    Depressive Symptoms: yes see HPI  (Hypo) Manic Symptoms:   Elevated Mood:  No Irritable Mood:  No Grandiosity:  No Distractibility:  No Labiality of Mood:  No Delusions:  No Hallucinations:  No Impulsivity:  No Sexually Inappropriate Behavior:  No Financial Extravagance:  No Flight of Ideas:  No  Anxiety Symptoms: Excessive Worry:  pt only has racing thougths and anxiety when she wakes up in the middle of the night. Panic Symptoms:  No Agoraphobia:  No Obsessive  Compulsive: No  Symptoms: None, Specific Phobias:  No Social Anxiety:  No  Psychotic Symptoms:  Hallucinations: No None Delusions:  No Paranoia:  No   Ideas of Reference:  No  PTSD Symptoms: Ever had a traumatic exposure:  No Had a traumatic exposure in the last month:  No Re-experiencing: No None Hypervigilance:  No Hyperarousal: No None Avoidance: No None  Traumatic Brain Injury: No  Past Psychiatric History: Diagnosis: Depression  Hospitalizations: denies  Outpatient Care: denies  Substance Abuse Care: denies  Self-Mutilation: denies  Suicidal Attempts: denies  Violent Behaviors: denies   Past Medical History:   Past Medical History  Diagnosis Date  . stomach ca dx'd 2010    chemo/xrt comp 05/2009  . Hypertension   . Diabetes mellitus   . Asthma   . Sleep apnea   . Degenerative joint disease   . Hyperlipidemia   . GERD (gastroesophageal reflux disease)    History of Loss of Consciousness:  No Seizure History:  No Cardiac History:  No Allergies:   Allergies  Allergen Reactions  . Codeine Other (See Comments)    paranoid  . Zocor [Simvastatin - High Dose] Other (See Comments)    Muscle spasms  . Penicillins Rash   Current Medications:  Current Outpatient Prescriptions  Medication Sig Dispense Refill  . albuterol (PROVENTIL HFA;VENTOLIN HFA) 108 (90 BASE) MCG/ACT inhaler Inhale 2 puffs into the lungs every 4 (four) hours as needed for wheezing or shortness of breath.      .Marland Kitchenalbuterol (PROVENTIL) (2.5 MG/3ML) 0.083% nebulizer solution Use as directed 2.5 mg in  the mouth or throat as needed for wheezing. For shortness of breath      . ARIPiprazole (ABILIFY) 2 MG tablet Take 1 tablet (2 mg total) by mouth daily.  30 tablet  1  . carvedilol (COREG) 12.5 MG tablet Take 12.5 mg by mouth every morning.      . Dapagliflozin Propanediol (FARXIGA) 10 MG TABS Take 1 tablet by mouth daily.      Marland Kitchen FLUoxetine (PROZAC) 40 MG capsule Take 1 capsule (40 mg total) by  mouth daily.  20 capsule  0  . glimepiride (AMARYL) 4 MG tablet Take 4 mg by mouth 2 (two) times daily.       Marland Kitchen lidocaine-prilocaine (EMLA) cream Apply 1 application topically as needed.  30 g  12  . metFORMIN (GLUCOPHAGE) 850 MG tablet Take 1 tablet (850 mg total) by mouth 2 (two) times daily with a meal.  60 tablet  3  . montelukast (SINGULAIR) 10 MG tablet Take 10 mg by mouth at bedtime.        . Multiple Vitamin (MULTIVITAMIN WITH MINERALS) TABS tablet Take 1 tablet by mouth daily.      . prochlorperazine (COMPAZINE) 10 MG tablet Take 1 tablet (10 mg total) by mouth every 6 (six) hours as needed for nausea or vomiting.  60 tablet  4  . senna-docusate (SENOKOT-S) 8.6-50 MG per tablet Take 2 tablets by mouth daily.  20 tablet  0  . traMADol (ULTRAM) 50 MG tablet Take 50 mg by mouth every 8 (eight) hours as needed.      . triamterene-hydrochlorothiazide (DYAZIDE) 37.5-25 MG per capsule Take 1 capsule by mouth daily.        No current facility-administered medications for this visit.    Previous Psychotropic Medications:  Medication Dose   Prozac  73m  Abilify 218m                 Substance Abuse History in the last 12 months: Substance Age of 1st Use Last Use Amount Specific Type  Nicotine   Quit smoking in 1990 1 pack every 2-3 days   Alcohol  denies        Cannabis  denies        Opiates  denies        Cocaine  denies        Methamphetamines  denies        LSD  denies        Ecstasy  denies         Benzodiazepines  denies        Caffeine   today 1 can of soda a day    Inhalants denies        Others: denies                         Medical Consequences of Substance Abuse: denies   Legal Consequences of Substance Abuse: denies  Family Consequences of Substance Abuse: denies  Blackouts:  No DT's:  No Withdrawal Symptoms:  No None  Social History: [place] Members: grandmother  until she was [id number] her mother who pt did not get along with, 2 brothers  Marital Status:  Divorced for many, many years Children: 4  Sons: 1  Daughters: 3 Relationships: good with kids Education:  HS Graduate Educational Problems/Performance: good Religious Beliefs/Practices: [employer] History of Abuse: none Occupational  Experiences: retired, used to work as a Clinical cytogeneticist until [employer] History:  None. Legal History: denies Hobbies/Interests: reading and games  Family History:   Family History  Problem Relation Age of Onset  . Cancer Mother   . Alzheimer's disease Father   . Diabetes Brother   . Diabetes Maternal Grandmother   . Cancer Maternal Grandfather     lung    Mental Status Examination/Evaluation: Objective:  Appearance: Casual, 3 inch nails on each finger  Eye Contact::  Fair  Speech:  Clear and Coherent and Normal Rate  Volume:  Normal  Mood:  depressed  Affect:  Congruent and Tearful  Attention/Concentration: good  General fund of knowledge: average  Thought Process:  Goal Directed, Linear and Logical  Orientation:  Full (Time, Place, and Person)  Thought Content:  WDL  Suicidal Thoughts:  No  Homicidal Thoughts:  No  Judgement:  Good  Insight:  Good  Psychomotor Activity:  Normal  Akathisia:  No  Handed:  Right  AIMS (if indicated):  0 Facial and Oral Movements  Muscles of Facial Expression: None, normal  Lips and Perioral Area: None, normal  Jaw: None, normal  Tongue: None, normal Extremity Movements: Upper (arms, wrists, hands, fingers): None, normal  Lower (legs, knees, ankles, toes): None, normal,  Trunk Movements:  Neck, shoulders, hips: None, normal,  Overall Severity : Severity of abnormal movements (highest score from questions above): None, normal  Incapacitation due to abnormal movements: None, normal  Patient's awareness of abnormal movements (rate only patient's report): No Awareness, Dental Status  Current problems with teeth  and/or dentures?: No  Does patient usually wear dentures?: No     Assets:  Communication Skills Desire for Improvement Financial Resources/Insurance Housing Social Support    Laboratory/X-Ray Psychological Evaluation(s)   reviewed- creatinine and glucose elevated,   none to review   Assessment:  MDD  AXIS I MDD  AXIS II Deferred  AXIS III Past Medical History  Diagnosis Date  . stomach ca dx'd 2010    chemo/xrt comp 05/2009  . Hypertension   . Diabetes mellitus   . Asthma   . Sleep apnea   . Degenerative joint disease   . Hyperlipidemia   . GERD (gastroesophageal reflux disease)      AXIS IV economic problems and other psychosocial or environmental problems  AXIS V 51-60 moderate symptoms   Treatment Plan/Recommendations:  Plan of Care:  Medication management with supportive therapy. Risks/benefits and SE of the medication discussed. Pt verbalized understanding and verbal consent obtained for treatment.  Affirm with the patient that the medications are taken as ordered. Patient expressed understanding of how their medications were to be used.     Laboratory:  none at this time  Psychotherapy: Therapy: brief supportive therapy provided. Discussed psychosocial stressors in detail.     Medications: Increase Prozac to 29m qD for depression  Start trial of Remeron 166mpo qHS for insomnia, appetite and depression. Pt is aware of SE of increased appetite and will monitor food intake due to her DM.  D/c Abilify  Routine PRN Medications:  No  Consultations: pt declined therapy at this point in time  Safety Concerns:  Pt denies SI and is at an acute low risk for suicide.Patient told to call clinic if any problems occur. Patient advised to go to ER if they should develop SI/HI, side effects, or if symptoms worsen. Has crisis numbers to call if needed. Pt verbalized understanding.   Other:  F/up in  2 months or sooner if needed     Charlcie Cradle, MD 7/7/20154:09  PM

## 2014-02-19 ENCOUNTER — Other Ambulatory Visit: Payer: Self-pay | Admitting: Nurse Practitioner

## 2014-02-19 DIAGNOSIS — E119 Type 2 diabetes mellitus without complications: Secondary | ICD-10-CM

## 2014-02-19 DIAGNOSIS — C169 Malignant neoplasm of stomach, unspecified: Secondary | ICD-10-CM

## 2014-02-20 ENCOUNTER — Other Ambulatory Visit (HOSPITAL_BASED_OUTPATIENT_CLINIC_OR_DEPARTMENT_OTHER): Payer: Medicare HMO | Admitting: Lab

## 2014-02-20 ENCOUNTER — Encounter: Payer: Self-pay | Admitting: Internal Medicine

## 2014-02-20 ENCOUNTER — Ambulatory Visit (INDEPENDENT_AMBULATORY_CARE_PROVIDER_SITE_OTHER): Payer: Medicare HMO | Admitting: Internal Medicine

## 2014-02-20 ENCOUNTER — Encounter: Payer: Self-pay | Admitting: Hematology & Oncology

## 2014-02-20 ENCOUNTER — Ambulatory Visit (HOSPITAL_BASED_OUTPATIENT_CLINIC_OR_DEPARTMENT_OTHER): Payer: Medicare HMO | Admitting: Hematology & Oncology

## 2014-02-20 ENCOUNTER — Ambulatory Visit (HOSPITAL_BASED_OUTPATIENT_CLINIC_OR_DEPARTMENT_OTHER): Payer: Medicare HMO

## 2014-02-20 ENCOUNTER — Telehealth: Payer: Self-pay | Admitting: Hematology & Oncology

## 2014-02-20 VITALS — BP 103/70 | HR 70 | Temp 97.8°F | Resp 14 | Ht 66.0 in | Wt 245.0 lb

## 2014-02-20 VITALS — BP 130/70 | HR 68 | Temp 98.0°F | Resp 16 | Ht 67.0 in | Wt 246.0 lb

## 2014-02-20 DIAGNOSIS — C169 Malignant neoplasm of stomach, unspecified: Secondary | ICD-10-CM

## 2014-02-20 DIAGNOSIS — E1165 Type 2 diabetes mellitus with hyperglycemia: Principal | ICD-10-CM

## 2014-02-20 DIAGNOSIS — Z85028 Personal history of other malignant neoplasm of stomach: Secondary | ICD-10-CM

## 2014-02-20 DIAGNOSIS — D509 Iron deficiency anemia, unspecified: Secondary | ICD-10-CM

## 2014-02-20 DIAGNOSIS — F331 Major depressive disorder, recurrent, moderate: Secondary | ICD-10-CM

## 2014-02-20 DIAGNOSIS — IMO0001 Reserved for inherently not codable concepts without codable children: Secondary | ICD-10-CM

## 2014-02-20 DIAGNOSIS — E119 Type 2 diabetes mellitus without complications: Secondary | ICD-10-CM

## 2014-02-20 DIAGNOSIS — I10 Essential (primary) hypertension: Secondary | ICD-10-CM

## 2014-02-20 DIAGNOSIS — E785 Hyperlipidemia, unspecified: Secondary | ICD-10-CM

## 2014-02-20 LAB — CMP (CANCER CENTER ONLY)
ALBUMIN: 3.7 g/dL (ref 3.3–5.5)
ALT(SGPT): 22 U/L (ref 10–47)
AST: 22 U/L (ref 11–38)
Alkaline Phosphatase: 70 U/L (ref 26–84)
BUN: 17 mg/dL (ref 7–22)
CO2: 26 mEq/L (ref 18–33)
Calcium: 9.3 mg/dL (ref 8.0–10.3)
Chloride: 99 mEq/L (ref 98–108)
Creat: 1.3 mg/dl — ABNORMAL HIGH (ref 0.6–1.2)
Glucose, Bld: 169 mg/dL — ABNORMAL HIGH (ref 73–118)
Potassium: 4.9 mEq/L — ABNORMAL HIGH (ref 3.3–4.7)
SODIUM: 147 meq/L — AB (ref 128–145)
Total Bilirubin: 0.5 mg/dl (ref 0.20–1.60)
Total Protein: 7.8 g/dL (ref 6.4–8.1)

## 2014-02-20 LAB — CBC WITH DIFFERENTIAL (CANCER CENTER ONLY)
BASO#: 0 10*3/uL (ref 0.0–0.2)
BASO%: 0.2 % (ref 0.0–2.0)
EOS ABS: 0.4 10*3/uL (ref 0.0–0.5)
EOS%: 5.5 % (ref 0.0–7.0)
HEMATOCRIT: 40.2 % (ref 34.8–46.6)
HEMOGLOBIN: 13.8 g/dL (ref 11.6–15.9)
LYMPH#: 3 10*3/uL (ref 0.9–3.3)
LYMPH%: 37.5 % (ref 14.0–48.0)
MCH: 29.7 pg (ref 26.0–34.0)
MCHC: 34.3 g/dL (ref 32.0–36.0)
MCV: 87 fL (ref 81–101)
MONO#: 0.7 10*3/uL (ref 0.1–0.9)
MONO%: 9 % (ref 0.0–13.0)
NEUT#: 3.8 10*3/uL (ref 1.5–6.5)
NEUT%: 47.8 % (ref 39.6–80.0)
Platelets: 337 10*3/uL (ref 145–400)
RBC: 4.64 10*6/uL (ref 3.70–5.32)
RDW: 15.4 % (ref 11.1–15.7)
WBC: 8 10*3/uL (ref 3.9–10.0)

## 2014-02-20 LAB — FERRITIN CHCC: Ferritin: 492 ng/ml — ABNORMAL HIGH (ref 9–269)

## 2014-02-20 LAB — IRON AND TIBC CHCC
%SAT: 36 % (ref 21–57)
Iron: 88 ug/dL (ref 41–142)
TIBC: 246 ug/dL (ref 236–444)
UIBC: 158 ug/dL (ref 120–384)

## 2014-02-20 MED ORDER — HEPARIN SOD (PORK) LOCK FLUSH 100 UNIT/ML IV SOLN
500.0000 [IU] | Freq: Once | INTRAVENOUS | Status: AC | PRN
  Administered 2014-02-20: 500 [IU] via INTRAVENOUS
  Filled 2014-02-20: qty 5

## 2014-02-20 MED ORDER — SODIUM CHLORIDE 0.9 % IJ SOLN
10.0000 mL | INTRAMUSCULAR | Status: DC | PRN
  Administered 2014-02-20: 10 mL via INTRAVENOUS
  Filled 2014-02-20: qty 10

## 2014-02-20 NOTE — Telephone Encounter (Signed)
Pt made 10-29 follow up said would call back for flush appointment in 6 weeks

## 2014-02-20 NOTE — Patient Instructions (Signed)
Will set up referral with Dr. Cruzita Lederer    To go to xray to day to set up mammogram  Schedule CPE  Make appointment with Dr. Garwin Brothers for pap smear and GTN exam   See me in 3 months 30 mins

## 2014-02-20 NOTE — Progress Notes (Signed)
Subjective:    Patient ID: [name], female    DOB: 12/24/1950, 63 y.o.   MRN: [id number]  HPI Jazlyn is here for follow up.  She has seen Dr. Marin Olp this am in follow up and she states she had a good report  Lipid, TSH and AIC  Labs are pending.   She has been to [facility] for recurrent Major Depression  and they stopped her Abilify and placed her on Remeron.  She states her mood is brighter. and things have been better    She would like to see a new endocrinologist.  She tells me FBS at home run 90-150   HM  Has not had mm in several years.  Gyn exam and pap done Dr. Garwin Brothers   Allergies  Allergen Reactions  . Codeine Other (See Comments)    paranoid  . Zocor [Simvastatin - High Dose] Other (See Comments)    Muscle spasms  . Penicillins Rash   Past Medical History  Diagnosis Date  . stomach ca dx'd 2010    chemo/xrt comp 05/2009  . Hypertension   . Diabetes mellitus   . Asthma   . Sleep apnea   . Degenerative joint disease   . Hyperlipidemia   . GERD (gastroesophageal reflux disease)    Past Surgical History  Procedure Laterality Date  . Total knee arthroplasty      Left  . Total hip arthroplasty      Right  . Shoulder surgery      Left  . Appendectomy    . Cesarean section    . Hernia repair    . Colon surgery    . Esophagogastroduodenoscopy N/A 10/15/2012    Procedure: ESOPHAGOGASTRODUODENOSCOPY (EGD);  Surgeon: Lear Ng, MD;  Location: [facility];  Service: Endoscopy;  Laterality: N/A;   History   Social History  . Marital Status: Divorced    Spouse Name: N/A    Number of Children: N/A  . Years of Education: N/A   Occupational History  . Not on file.   Social History Main Topics  . Smoking status: Former Smoker -- 1.00 packs/day for 15 years    Types: Cigarettes    Start date: 09/27/1973    Quit date: 10/15/1988  . Smokeless tobacco: Never Used     Comment: quit smoking 14 years ago  . Alcohol Use: No  . Drug Use: No  . Sexual  Activity: No   Other Topics Concern  . Not on file   Social History Narrative  . No narrative on file   Family History  Problem Relation Age of Onset  . Cancer Mother   . Alzheimer's disease Father   . Diabetes Brother   . Diabetes Maternal Grandmother   . Cancer Maternal Grandfather     lung   Patient Active Problem List   Diagnosis Date Noted  . MDD (major depressive disorder), recurrent episode, moderate 01/28/2014  . GERD (gastroesophageal reflux disease) 01/02/2014  . Nausea 10/15/2012  . ADENOCARCINOMA, STOMACH  Stage I  S/P CTX and XRT 12/10/2008  . CHRONIC RESPIRATORY FAILURE 01/03/2008  . DIABETES MELLITUS, TYPE II 06/27/2007  . HYPERLIPIDEMIA 06/27/2007  . OBESITY, MORBID 06/27/2007  . SLEEP APNEA, OBSTRUCTIVE 06/27/2007  . HYPERTENSION 06/27/2007  . ASTHMA 06/27/2007  . OSTEOARTHRITIS  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 06/27/2007   Current Outpatient Prescriptions on File Prior to Visit  Medication Sig Dispense Refill  . albuterol (PROVENTIL HFA;VENTOLIN HFA) 108 (90 BASE)  MCG/ACT inhaler Inhale 2 puffs into the lungs every 4 (four) hours as needed for wheezing or shortness of breath.      Marland Kitchen albuterol (PROVENTIL) (2.5 MG/3ML) 0.083% nebulizer solution Use as directed 2.5 mg in the mouth or throat as needed for wheezing. For shortness of breath      . carvedilol (COREG) 12.5 MG tablet Take 12.5 mg by mouth every morning.      . Dapagliflozin Propanediol (FARXIGA) 10 MG TABS Take 1 tablet by mouth daily.      Marland Kitchen glimepiride (AMARYL) 4 MG tablet Take 4 mg by mouth 2 (two) times daily.       Marland Kitchen lidocaine-prilocaine (EMLA) cream Apply 1 application topically as needed.  30 g  12  . metFORMIN (GLUCOPHAGE) 850 MG tablet Take 1 tablet (850 mg total) by mouth 2 (two) times daily with a meal.  60 tablet  3  . mirtazapine (REMERON) 15 MG tablet Take 1 tablet (15 mg total) by mouth at bedtime.  30 tablet  1  . montelukast (SINGULAIR) 10 MG tablet Take 10 mg by mouth at bedtime.         . Multiple Vitamin (MULTIVITAMIN WITH MINERALS) TABS tablet Take 1 tablet by mouth daily.      . prochlorperazine (COMPAZINE) 10 MG tablet Take 1 tablet (10 mg total) by mouth every 6 (six) hours as needed for nausea or vomiting.  60 tablet  4  . senna-docusate (SENOKOT-S) 8.6-50 MG per tablet Take 2 tablets by mouth daily.  20 tablet  0  . traMADol (ULTRAM) 50 MG tablet Take 50 mg by mouth every 8 (eight) hours as needed.      . triamterene-hydrochlorothiazide (DYAZIDE) 37.5-25 MG per capsule Take 1 capsule by mouth daily.        No current facility-administered medications on file prior to visit.       Review of Systems See HPI    Objective:   Physical Exam  Physical Exam  Nursing note and vitals reviewed.  Constitutional: She is oriented to person, place, and time. She appears well-developed and well-nourished.  HENT:  Head: Normocephalic and atraumatic.  Cardiovascular: Normal rate and regular rhythm. Exam reveals no gallop and no friction rub.  No murmur heard.  Pulmonary/Chest: Breath sounds normal. She has no wheezes. She has no rales.  Neurological: She is alert and oriented to person, place, and time.  Skin: Skin is warm and dry.  Psychiatric: She has a normal mood and affect. Her behavior is normal.        Assessment & Plan:  HTN;  Continue meds  Hyperlipidemia  Lipids pending  DM will refer to Dr. Cruzita Lederer  Minimally  Elevated creatinine.  mulitple culprits including HTN and DM.  Ct done 3/15 shows no stone or hydro    She is not on ACE or ARB but will leave to discretion of endocrinology   Depression:   Improving  conitnue meds as per   [facility]  HM:  To xray to schedule her MM today.  Advised to call her GYN or pelvic and pap  See me in 3 months or sooner prn

## 2014-02-20 NOTE — Progress Notes (Signed)
Hematology and Oncology Follow Up Visit  [name] [id number] [birth year] 63 y.o. 02/20/2014   Principle Diagnosis:   Stage IB (T1, N1, M0) adenocarcinoma of the stomach Non-insulin-dependent diabetes   Current Therapy:   Observation     Interim History:  Ms.  Jefferson is doing much better. She is much more happy. She now is seeing me internal medicine doctor. This, I think will really help with her medical problems. She's not having any problems with abdominal pain. There's no nausea vomiting. There's no skin lesions. She's had no change in bowel or bladder habits. She's had no leg swelling. She's had no cough or shortness of breath.  Medications: Current outpatient prescriptions:albuterol (PROVENTIL HFA;VENTOLIN HFA) 108 (90 BASE) MCG/ACT inhaler, Inhale 2 puffs into the lungs every 4 (four) hours as needed for wheezing or shortness of breath., Disp: , Rfl: ;  albuterol (PROVENTIL) (2.5 MG/3ML) 0.083% nebulizer solution, Use as directed 2.5 mg in the mouth or throat as needed for wheezing. For shortness of breath, Disp: , Rfl:  carvedilol (COREG) 12.5 MG tablet, Take 12.5 mg by mouth every morning., Disp: , Rfl: ;  Dapagliflozin Propanediol (FARXIGA) 10 MG TABS, Take 1 tablet by mouth daily., Disp: , Rfl: ;  FLUoxetine (PROZAC) 20 MG capsule, Take 60 mg by mouth daily. TAKING A 40 MG AND A 20 MG  TO = 60 MG DAILY, Disp: , Rfl: ;  glimepiride (AMARYL) 4 MG tablet, Take 4 mg by mouth 2 (two) times daily. , Disp: , Rfl:  lidocaine-prilocaine (EMLA) cream, Apply 1 application topically as needed., Disp: 30 g, Rfl: 12;  metFORMIN (GLUCOPHAGE) 850 MG tablet, Take 1 tablet (850 mg total) by mouth 2 (two) times daily with a meal., Disp: 60 tablet, Rfl: 3;  mirtazapine (REMERON) 15 MG tablet, Take 1 tablet (15 mg total) by mouth at bedtime., Disp: 30 tablet, Rfl: 1;  montelukast (SINGULAIR) 10 MG tablet, Take 10 mg by mouth at bedtime.  , Disp: , Rfl:  Multiple Vitamin (MULTIVITAMIN WITH MINERALS) TABS  tablet, Take 1 tablet by mouth daily., Disp: , Rfl: ;  prochlorperazine (COMPAZINE) 10 MG tablet, Take 1 tablet (10 mg total) by mouth every 6 (six) hours as needed for nausea or vomiting., Disp: 60 tablet, Rfl: 4;  senna-docusate (SENOKOT-S) 8.6-50 MG per tablet, Take 2 tablets by mouth daily., Disp: 20 tablet, Rfl: 0 traMADol (ULTRAM) 50 MG tablet, Take 50 mg by mouth every 8 (eight) hours as needed., Disp: , Rfl: ;  triamterene-hydrochlorothiazide (DYAZIDE) 37.5-25 MG per capsule, Take 1 capsule by mouth daily. , Disp: , Rfl:   Allergies:  Allergies  Allergen Reactions  . Codeine Other (See Comments)    paranoid  . Zocor [Simvastatin - High Dose] Other (See Comments)    Muscle spasms  . Penicillins Rash    Past Medical History, Surgical history, Social history, and Family History were reviewed and updated.  Review of Systems: As above  Physical Exam:  height is 5' 6"  (1.676 m) and weight is 245 lb (111.131 kg). Her oral temperature is 97.8 F (36.6 C). Her blood pressure is 103/70 and her pulse is 70. Her respiration is 14.   Obese African American female. Head and neck exam shows no ocular or oral lesions. She has no palpable cervical or supraclavicular lymph nodes. Lungs are clear. Cardiac exam regular in rhythm. Abdomen is soft. She has well-healed laparotomy scar. There is no fluid wave. There is no palpable liver or spleen tip. Back exam shows  no tenderness over the spine ribs or hips. Extremities shows no clubbing cyanosis or edema. Skin exam no rashes. Neurological exam is nonfocal.  Lab Results  Component Value Date   WBC 8.0 02/20/2014   HGB 13.8 02/20/2014   HCT 40.2 02/20/2014   MCV 87 02/20/2014   PLT 337 02/20/2014     Chemistry      Component Value Date/Time   NA 147* 02/20/2014 0919   NA 142 12/27/2013 1440   NA 141 11/14/2013 0804   K 4.9* 02/20/2014 0919   K 4.5 12/27/2013 1440   K 4.6 11/14/2013 0804   CL 99 02/20/2014 0919   CL 103 12/27/2013 1440   CO2 26 02/20/2014  0919   CO2 27 12/27/2013 1440   CO2 24 11/14/2013 0804   BUN 17 02/20/2014 0919   BUN 14 12/27/2013 1440   BUN 17.8 11/14/2013 0804   CREATININE 1.3* 02/20/2014 0919   CREATININE 1.34* 12/27/2013 1440   CREATININE 1.7* 11/14/2013 0804      Component Value Date/Time   CALCIUM 9.3 02/20/2014 0919   CALCIUM 9.5 12/27/2013 1440   CALCIUM 10.4 11/14/2013 0804   ALKPHOS 70 02/20/2014 0919   ALKPHOS 63 12/27/2013 1440   ALKPHOS 75 11/14/2013 0804   AST 22 02/20/2014 0919   AST 25 12/27/2013 1440   AST 16 11/14/2013 0804   ALT 22 02/20/2014 0919   ALT 29 12/27/2013 1440   ALT 14 11/14/2013 0804   BILITOT 0.50 02/20/2014 0919   BILITOT 0.4 12/27/2013 1440   BILITOT 0.38 11/14/2013 0804     Ferritin is 492. Iron saturation 36%. Total iron is 88.   Impression and Plan: [name] is 63 year old African [place] female with stage IB stomach cancer. She underwent resection. She then had adjuvant radiation and chemotherapy. She completed this about 6 years ago.  I really don't think that recurrent stomach cancer will be a problem for her. She may have intermittent iron deficiency anemia that we are watching.  I think the best thing that she there was on a family doctor felt a care of her medical problems.  I will plan to get her back in 3 more months. She has a Port-A-Cath. This was flushed today. We will have her back in 6 weeks for another flush.   Volanda Napoleon, MD 7/30/201510:26 AM

## 2014-02-20 NOTE — Patient Instructions (Signed)

## 2014-02-21 ENCOUNTER — Ambulatory Visit: Payer: Medicare HMO | Admitting: Hematology & Oncology

## 2014-02-21 ENCOUNTER — Other Ambulatory Visit: Payer: Medicare HMO | Admitting: Lab

## 2014-03-07 ENCOUNTER — Encounter: Payer: Self-pay | Admitting: Endocrinology

## 2014-03-30 ENCOUNTER — Other Ambulatory Visit (HOSPITAL_COMMUNITY): Payer: Self-pay | Admitting: Psychiatry

## 2014-04-01 ENCOUNTER — Ambulatory Visit (HOSPITAL_COMMUNITY): Payer: Self-pay | Admitting: Psychiatry

## 2014-04-02 ENCOUNTER — Ambulatory Visit (HOSPITAL_BASED_OUTPATIENT_CLINIC_OR_DEPARTMENT_OTHER): Payer: Self-pay

## 2014-04-21 ENCOUNTER — Other Ambulatory Visit: Payer: Self-pay | Admitting: Nurse Practitioner

## 2014-04-21 DIAGNOSIS — E119 Type 2 diabetes mellitus without complications: Principal | ICD-10-CM

## 2014-04-21 DIAGNOSIS — IMO0001 Reserved for inherently not codable concepts without codable children: Secondary | ICD-10-CM

## 2014-04-21 DIAGNOSIS — Z794 Long term (current) use of insulin: Principal | ICD-10-CM

## 2014-04-21 MED ORDER — PROCHLORPERAZINE MALEATE 10 MG PO TABS: 10.0000 mg | ORAL_TABLET | Freq: Four times a day (QID) | ORAL | Status: DC | PRN

## 2014-04-25 ENCOUNTER — Other Ambulatory Visit: Payer: Self-pay | Admitting: *Deleted

## 2014-04-25 ENCOUNTER — Telehealth: Payer: Self-pay | Admitting: *Deleted

## 2014-04-25 DIAGNOSIS — C169 Malignant neoplasm of stomach, unspecified: Secondary | ICD-10-CM

## 2014-04-25 NOTE — Telephone Encounter (Signed)
Patient  Called stating that she has been dizzy when she gets up.  Patient states she is not eating and drinking well and that she has lost 40 pounds in past 6 months.  Talked with Dr. Marin Olp.  Patient to see sara next week.

## 2014-04-28 ENCOUNTER — Other Ambulatory Visit (HOSPITAL_BASED_OUTPATIENT_CLINIC_OR_DEPARTMENT_OTHER): Payer: Medicare HMO | Admitting: Lab

## 2014-04-28 ENCOUNTER — Encounter: Payer: Self-pay | Admitting: Family

## 2014-04-28 ENCOUNTER — Ambulatory Visit (HOSPITAL_BASED_OUTPATIENT_CLINIC_OR_DEPARTMENT_OTHER): Payer: Medicare HMO

## 2014-04-28 ENCOUNTER — Ambulatory Visit (HOSPITAL_BASED_OUTPATIENT_CLINIC_OR_DEPARTMENT_OTHER): Payer: Medicare HMO | Admitting: Family

## 2014-04-28 VITALS — BP 96/71 | HR 84 | Temp 98.0°F | Resp 16 | Ht 67.0 in | Wt 247.0 lb

## 2014-04-28 DIAGNOSIS — C169 Malignant neoplasm of stomach, unspecified: Secondary | ICD-10-CM

## 2014-04-28 DIAGNOSIS — R42 Dizziness and giddiness: Secondary | ICD-10-CM

## 2014-04-28 DIAGNOSIS — D509 Iron deficiency anemia, unspecified: Secondary | ICD-10-CM

## 2014-04-28 DIAGNOSIS — Z85028 Personal history of other malignant neoplasm of stomach: Secondary | ICD-10-CM

## 2014-04-28 LAB — IRON AND TIBC CHCC
%SAT: 30 % (ref 21–57)
Iron: 76 ug/dL (ref 41–142)
TIBC: 253 ug/dL (ref 236–444)
UIBC: 177 ug/dL (ref 120–384)

## 2014-04-28 LAB — CBC WITH DIFFERENTIAL (CANCER CENTER ONLY)
BASO#: 0 10*3/uL (ref 0.0–0.2)
BASO%: 0.4 % (ref 0.0–2.0)
EOS%: 5.4 % (ref 0.0–7.0)
Eosinophils Absolute: 0.4 10*3/uL (ref 0.0–0.5)
HCT: 35.9 % (ref 34.8–46.6)
HEMOGLOBIN: 12.3 g/dL (ref 11.6–15.9)
LYMPH#: 2.8 10*3/uL (ref 0.9–3.3)
LYMPH%: 40.3 % (ref 14.0–48.0)
MCH: 30.8 pg (ref 26.0–34.0)
MCHC: 34.3 g/dL (ref 32.0–36.0)
MCV: 90 fL (ref 81–101)
MONO#: 0.6 10*3/uL (ref 0.1–0.9)
MONO%: 8.8 % (ref 0.0–13.0)
NEUT#: 3.1 10*3/uL (ref 1.5–6.5)
NEUT%: 45.1 % (ref 39.6–80.0)
Platelets: 330 10*3/uL (ref 145–400)
RBC: 3.99 10*6/uL (ref 3.70–5.32)
RDW: 14.3 % (ref 11.1–15.7)
WBC: 6.9 10*3/uL (ref 3.9–10.0)

## 2014-04-28 LAB — RETICULOCYTES (CHCC)
ABS Retic: 59.9 10*3/uL (ref 19.0–186.0)
RBC.: 3.99 MIL/uL (ref 3.87–5.11)
Retic Ct Pct: 1.5 % (ref 0.4–2.3)

## 2014-04-28 LAB — CMP (CANCER CENTER ONLY)
ALT(SGPT): 14 U/L (ref 10–47)
AST: 20 U/L (ref 11–38)
Albumin: 3.6 g/dL (ref 3.3–5.5)
Alkaline Phosphatase: 58 U/L (ref 26–84)
BUN, Bld: 19 mg/dL (ref 7–22)
CALCIUM: 9.9 mg/dL (ref 8.0–10.3)
CHLORIDE: 104 meq/L (ref 98–108)
CO2: 25 meq/L (ref 18–33)
Creat: 1.3 mg/dl — ABNORMAL HIGH (ref 0.6–1.2)
GLUCOSE: 165 mg/dL — AB (ref 73–118)
POTASSIUM: 4 meq/L (ref 3.3–4.7)
SODIUM: 139 meq/L (ref 128–145)
TOTAL PROTEIN: 7 g/dL (ref 6.4–8.1)
Total Bilirubin: 0.5 mg/dl (ref 0.20–1.60)

## 2014-04-28 LAB — FERRITIN CHCC: Ferritin: 379 ng/ml — ABNORMAL HIGH (ref 9–269)

## 2014-04-28 MED ORDER — SODIUM CHLORIDE 0.9 % IJ SOLN
10.0000 mL | INTRAMUSCULAR | Status: DC | PRN
  Administered 2014-04-28: 10 mL via INTRAVENOUS
  Filled 2014-04-28: qty 10

## 2014-04-28 MED ORDER — HEPARIN SOD (PORK) LOCK FLUSH 100 UNIT/ML IV SOLN
500.0000 [IU] | Freq: Once | INTRAVENOUS | Status: AC
  Administered 2014-04-28: 500 [IU] via INTRAVENOUS
  Filled 2014-04-28: qty 5

## 2014-04-28 MED ORDER — TRAMADOL HCL 50 MG PO TABS: 50.0000 mg | ORAL_TABLET | Freq: Three times a day (TID) | ORAL | Status: DC | PRN

## 2014-04-28 MED ORDER — SODIUM CHLORIDE 0.9 % IV SOLN: INTRAVENOUS | Status: DC

## 2014-04-28 MED ORDER — SODIUM CHLORIDE 0.9 % IV SOLN
Freq: Once | INTRAVENOUS | Status: AC
  Administered 2014-04-28: 11:00:00 via INTRAVENOUS

## 2014-04-28 NOTE — Patient Instructions (Signed)
Dehydration, Adult Dehydration is when you lose more fluids from the body than you take in. Vital organs like the kidneys, brain, and heart cannot function without a proper amount of fluids and salt. Any loss of fluids from the body can cause dehydration.  CAUSES   Vomiting.  Diarrhea.  Excessive sweating.  Excessive urine output.  Fever. SYMPTOMS  Mild dehydration  Thirst.  Dry lips.  Slightly dry mouth. Moderate dehydration  Very dry mouth.  Sunken eyes.  Skin does not bounce back quickly when lightly pinched and released.  Dark urine and decreased urine production.  Decreased tear production.  Headache. Severe dehydration  Very dry mouth.  Extreme thirst.  Rapid, weak pulse (more than 100 beats per minute at rest).  Cold hands and feet.  Not able to sweat in spite of heat and temperature.  Rapid breathing.  Blue lips.  Confusion and lethargy.  Difficulty being awakened.  Minimal urine production.  No tears. DIAGNOSIS  Your caregiver will diagnose dehydration based on your symptoms and your exam. Blood and urine tests will help confirm the diagnosis. The diagnostic evaluation should also identify the cause of dehydration. TREATMENT  Treatment of mild or moderate dehydration can often be done at home by increasing the amount of fluids that you drink. It is best to drink small amounts of fluid more often. Drinking too much at one time can make vomiting worse. Refer to the home care instructions below. Severe dehydration needs to be treated at the hospital where you will probably be given intravenous (IV) fluids that contain water and electrolytes. HOME CARE INSTRUCTIONS   Ask your caregiver about specific rehydration instructions.  Drink enough fluids to keep your urine clear or pale yellow.  Drink small amounts frequently if you have nausea and vomiting.  Eat as you normally do.  Avoid:  Foods or drinks high in sugar.  Carbonated  drinks.  Juice.  Extremely hot or cold fluids.  Drinks with caffeine.  Fatty, greasy foods.  Alcohol.  Tobacco.  Overeating.  Gelatin desserts.  Wash your hands well to avoid spreading bacteria and viruses.  Only take over-the-counter or prescription medicines for pain, discomfort, or fever as directed by your caregiver.  Ask your caregiver if you should continue all prescribed and over-the-counter medicines.  Keep all follow-up appointments with your caregiver. SEEK MEDICAL CARE IF:  You have abdominal pain and it increases or stays in one area (localizes).  You have a rash, stiff neck, or severe headache.  You are irritable, sleepy, or difficult to awaken.  You are weak, dizzy, or extremely thirsty. SEEK IMMEDIATE MEDICAL CARE IF:   You are unable to keep fluids down or you get worse despite treatment.  You have frequent episodes of vomiting or diarrhea.  You have blood or green matter (bile) in your vomit.  You have blood in your stool or your stool looks black and tarry.  You have not urinated in 6 to 8 hours, or you have only urinated a small amount of very dark urine.  You have a fever.  You faint. MAKE SURE YOU:   Understand these instructions.  Will watch your condition.  Will get help right away if you are not doing well or get worse. Document Released: 07/11/2005 Document Revised: 10/03/2011 Document Reviewed: 02/28/2011 [facility] Patient Information 2015 [place], [place]. This information is not intended to replace advice given to you by your health care provider. Make sure you discuss any questions you have with your health care  provider.

## 2014-04-28 NOTE — Progress Notes (Signed)
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 She is having issues with dizziness and has a flat affect today. She is slow answering questions but does answer correctly. She denies taking any medications today. Her BP is low at 96/71. She has not been keeping a close eye on her blood sugars because the battery in her glucometer is dead. Her blood sugar today is 165. She denies fever, chills, n/v, cough, rash, headache, SOB, chest pain, palpitations, abdominal pain, constipation, diarrhea, blood in urine or stool. No swelling, tenderness, numbness or tingling in her extremities. No bleeding or pain. Her appetite is ok and she says she is trying to drink plenty of fluids.   CURRENT TREATMENT: Observation  REVIEW OF SYSTEMS: All other 10 point review of systems is negative except for those issues mentioned above.   PAST MEDICAL HISTORY: Past Medical History  Diagnosis Date  . stomach ca dx'd 2010    chemo/xrt comp 05/2009  . Hypertension   . Diabetes mellitus   . Asthma   . Sleep apnea   . Degenerative joint disease   . Hyperlipidemia   . GERD (gastroesophageal reflux disease)    PAST SURGICAL HISTORY: Past Surgical History  Procedure Laterality Date  . Total knee arthroplasty      Left  . Total hip arthroplasty      Right  . Shoulder surgery      Left  . Appendectomy    . Cesarean section    . Hernia repair    . Colon surgery    . Esophagogastroduodenoscopy N/A 10/15/2012    Procedure: ESOPHAGOGASTRODUODENOSCOPY (EGD);  Surgeon: Lear Ng, MD;  Location: [facility];  Service: Endoscopy;  Laterality: N/A;   FAMILY HISTORY Family History   Problem Relation Age of Onset  . Cancer Mother   . Alzheimer's disease Father   . Diabetes Brother   . Diabetes Maternal Grandmother   . Cancer Maternal Grandfather     lung   GYNECOLOGIC HISTORY:  No LMP recorded. Patient is postmenopausal.   SOCIAL HISTORY:  History   Social History  . Marital Status: Divorced    Spouse Name: N/A    Number of Children: N/A  . Years of Education: N/A   Occupational History  . Not on file.   Social History Main Topics  . Smoking status: Former Smoker -- 1.00 packs/day for 15 years    Types: Cigarettes    Start date: 09/27/1973    Quit date: 10/15/1988  . Smokeless tobacco: Never Used     Comment: quit smoking 14 years ago  . Alcohol Use: No  . Drug Use: No  . Sexual Activity: No   Other Topics Concern  . Not on file   Social History Narrative  . No narrative on file   ADVANCED DIRECTIVES: <no information>  HEALTH MAINTENANCE: History  Substance Use Topics  . Smoking status: Former Smoker -- 1.00 packs/day for 15 years    Types: Cigarettes    Start date: 09/27/1973    Quit date: 10/15/1988  . Smokeless tobacco: Never Used     Comment: quit smoking 14 years ago  . Alcohol Use: No   Colonoscopy: PAP: Bone density: Lipid panel:  Allergies  Allergen Reactions  . Codeine Other (See Comments)    paranoid  . Zocor [Simvastatin - High Dose] Other (See Comments)    Muscle spasms  . Penicillins Rash   Current Outpatient Prescriptions  Medication Sig Dispense Refill  . albuterol (PROVENTIL HFA;VENTOLIN HFA) 108 (90 BASE) MCG/ACT inhaler Inhale 2 puffs into the lungs every 4 (four) hours as needed for wheezing or shortness of breath.      Marland Kitchen albuterol (PROVENTIL) (2.5 MG/3ML) 0.083% nebulizer solution Use as directed 2.5 mg in the mouth or throat as needed for wheezing. For shortness of breath      . carvedilol (COREG) 12.5 MG tablet Take 12.5 mg by mouth every morning.      Marland Kitchen FLUoxetine (PROZAC) 20 MG capsule Take 60 mg by  mouth daily. TAKING A 40 MG AND A 20 MG  TO = 60 MG DAILY      . glimepiride (AMARYL) 4 MG tablet Take 4 mg by mouth 2 (two) times daily.       Marland Kitchen lidocaine-prilocaine (EMLA) cream Apply 1 application topically as needed.  30 g  12  . metFORMIN (GLUCOPHAGE) 850 MG tablet Take 1 tablet (850 mg total) by mouth 2 (two) times daily with a meal.  60 tablet  3  . mirtazapine (REMERON) 15 MG tablet Take 1 tablet (15 mg total) by mouth at bedtime.  30 tablet  1  . montelukast (SINGULAIR) 10 MG tablet Take 10 mg by mouth at bedtime.        . Multiple Vitamin (MULTIVITAMIN WITH MINERALS) TABS tablet Take 1 tablet by mouth daily.      Marland Kitchen senna-docusate (SENOKOT-S) 8.6-50 MG per tablet Take 2 tablets by mouth daily.  20 tablet  0  . traMADol (ULTRAM) 50 MG tablet Take 1 tablet (50 mg total) by mouth every 8 (eight) hours as needed.  30 tablet  3  . triamterene-hydrochlorothiazide (DYAZIDE) 37.5-25 MG per capsule Take 1 capsule by mouth daily.       . prochlorperazine (COMPAZINE) 10 MG tablet Take 1 tablet (10 mg total) by mouth every 6 (six) hours as needed for nausea or vomiting.  60 tablet  4   No current facility-administered medications for this visit.   Facility-Administered Medications Ordered in Other Visits  Medication Dose Route Frequency Provider Last Rate Last Dose  . 0.9 %  sodium chloride infusion   Intravenous Once Eliezer Bottom, NP      . 0.9 %  sodium chloride infusion   Intravenous Continuous [place], NP      . heparin lock flush 100 unit/mL  500 Units Intravenous Once Volanda Napoleon, MD      . sodium chloride 0.9 % injection 10 mL  10 mL Intravenous PRN Volanda Napoleon, MD       OBJECTIVE: Filed Vitals:   04/28/14 0922  BP: 96/71  Pulse: 84  Temp: 98 F (36.7 C)  Resp: 16   Body mass index is 38.68 kg/(m^2). ECOG FS:1 - Symptomatic but completely ambulatory Ocular: Sclerae unicteric, pupils equal, round and reactive to light Ear-nose-throat: Oropharynx clear,  dentition fair Lymphatic: No cervical or supraclavicular adenopathy Lungs no rales or rhonchi, good excursion bilaterally Heart regular rate and rhythm, no murmur appreciated Abd soft, nontender, positive bowel sounds MSK no focal spinal tenderness, no joint edema Neuro: non-focal, well-oriented, appropriate affect Breasts: Deferred  LAB RESULTS: CMP     Component Value Date/Time   NA 139 04/28/2014 0958  NA 142 12/27/2013 1440   NA 141 11/14/2013 0804   K 4.0 04/28/2014 0958   K 4.5 12/27/2013 1440   K 4.6 11/14/2013 0804   CL 104 04/28/2014 0958   CL 103 12/27/2013 1440   CO2 25 04/28/2014 0958   CO2 27 12/27/2013 1440   CO2 24 11/14/2013 0804   GLUCOSE 165* 04/28/2014 0958   GLUCOSE 133* 12/27/2013 1440   GLUCOSE 157* 11/14/2013 0804   BUN 19 04/28/2014 0958   BUN 14 12/27/2013 1440   BUN 17.8 11/14/2013 0804   CREATININE 1.3* 04/28/2014 0958   CREATININE 1.34* 12/27/2013 1440   CREATININE 1.7* 11/14/2013 0804   CALCIUM 9.9 04/28/2014 0958   CALCIUM 9.5 12/27/2013 1440   CALCIUM 10.4 11/14/2013 0804   PROT 7.0 04/28/2014 0958   PROT 7.1 12/27/2013 1440   PROT 8.1 11/14/2013 0804   ALBUMIN 3.9 12/27/2013 1440   ALBUMIN 3.8 11/14/2013 0804   AST 20 04/28/2014 0958   AST 25 12/27/2013 1440   AST 16 11/14/2013 0804   ALT 14 04/28/2014 0958   ALT 29 12/27/2013 1440   ALT 14 11/14/2013 0804   ALKPHOS 58 04/28/2014 0958   ALKPHOS 63 12/27/2013 1440   ALKPHOS 75 11/14/2013 0804   BILITOT 0.50 04/28/2014 0958   BILITOT 0.4 12/27/2013 1440   BILITOT 0.38 11/14/2013 0804   GFRNONAA 31* 12/09/2013 1650   GFRAA 36* 12/09/2013 1650   No results found for this basename: SPEP, UPEP,  kappa and lambda light chains   Lab Results  Component Value Date   WBC 6.9 04/28/2014   NEUTROABS 3.1 04/28/2014   HGB 12.3 04/28/2014   HCT 35.9 04/28/2014   MCV 90 04/28/2014   PLT 330 04/28/2014   No results found for this basename: LABCA2   No components found with this basename: LABCA125   No results found for this basename: INR,  in  the last 168 hours  STUDIES: No results found.  ASSESSMENT/PLAN: [name] is 63 year old African [place] female with stage IB stomach cancer. She underwent resection and then had adjuvant radiation and chemotherapy. This was completed 6 years ago.  She is having issues with dizziness and lethargy today. Her BP is low and her blood sugar is 165.  We will give her fluids today.  We will also get an MRI of the Brain with contrast and see what it shows and go from there.  She already has a follow-up appointment with Dr. Marin Olp on Oct 29th.   Eliezer Bottom, NP 04/28/2014 10:35 AM

## 2014-04-29 ENCOUNTER — Ambulatory Visit (HOSPITAL_BASED_OUTPATIENT_CLINIC_OR_DEPARTMENT_OTHER): Payer: Self-pay

## 2014-05-02 ENCOUNTER — Ambulatory Visit (HOSPITAL_BASED_OUTPATIENT_CLINIC_OR_DEPARTMENT_OTHER)
Admission: RE | Admit: 2014-05-02 | Discharge: 2014-05-02 | Disposition: A | Discharge status: Home / Self Care | Payer: Medicare HMO | Source: Ambulatory Visit | Attending: Hematology & Oncology | Admitting: Hematology & Oncology

## 2014-05-02 DIAGNOSIS — Z794 Long term (current) use of insulin: Secondary | ICD-10-CM

## 2014-05-02 DIAGNOSIS — D638 Anemia in other chronic diseases classified elsewhere: Secondary | ICD-10-CM

## 2014-05-02 DIAGNOSIS — IMO0001 Reserved for inherently not codable concepts without codable children: Secondary | ICD-10-CM

## 2014-05-02 DIAGNOSIS — E119 Type 2 diabetes mellitus without complications: Secondary | ICD-10-CM

## 2014-05-02 DIAGNOSIS — Z1231 Encounter for screening mammogram for malignant neoplasm of breast: Secondary | ICD-10-CM | POA: Insufficient documentation

## 2014-05-02 DIAGNOSIS — C50919 Malignant neoplasm of unspecified site of unspecified female breast: Secondary | ICD-10-CM

## 2014-05-03 ENCOUNTER — Ambulatory Visit (HOSPITAL_BASED_OUTPATIENT_CLINIC_OR_DEPARTMENT_OTHER)
Admission: RE | Admit: 2014-05-03 | Discharge: 2014-05-03 | Disposition: A | Discharge status: Home / Self Care | Payer: Medicare HMO | Source: Ambulatory Visit | Attending: Hematology & Oncology | Admitting: Hematology & Oncology

## 2014-05-03 DIAGNOSIS — C50919 Malignant neoplasm of unspecified site of unspecified female breast: Secondary | ICD-10-CM | POA: Diagnosis present

## 2014-05-03 DIAGNOSIS — E119 Type 2 diabetes mellitus without complications: Secondary | ICD-10-CM

## 2014-05-03 DIAGNOSIS — Z794 Long term (current) use of insulin: Secondary | ICD-10-CM

## 2014-05-03 DIAGNOSIS — D638 Anemia in other chronic diseases classified elsewhere: Secondary | ICD-10-CM

## 2014-05-03 DIAGNOSIS — R42 Dizziness and giddiness: Secondary | ICD-10-CM

## 2014-05-20 ENCOUNTER — Ambulatory Visit (INDEPENDENT_AMBULATORY_CARE_PROVIDER_SITE_OTHER): Payer: Medicare HMO | Admitting: Psychiatry

## 2014-05-20 ENCOUNTER — Encounter (HOSPITAL_COMMUNITY): Payer: Self-pay | Admitting: Psychiatry

## 2014-05-20 ENCOUNTER — Ambulatory Visit (HOSPITAL_COMMUNITY): Payer: Self-pay | Admitting: Psychiatry

## 2014-05-20 VITALS — BP 128/82 | HR 72 | Ht 67.0 in | Wt 249.2 lb

## 2014-05-20 DIAGNOSIS — F331 Major depressive disorder, recurrent, moderate: Secondary | ICD-10-CM

## 2014-05-20 DIAGNOSIS — D509 Iron deficiency anemia, unspecified: Secondary | ICD-10-CM | POA: Insufficient documentation

## 2014-05-20 DIAGNOSIS — F321 Major depressive disorder, single episode, moderate: Secondary | ICD-10-CM

## 2014-05-20 MED ORDER — FLUOXETINE HCL 20 MG PO CAPS: 60.0000 mg | ORAL_CAPSULE | Freq: Every day | ORAL | Status: DC

## 2014-05-20 MED ORDER — MIRTAZAPINE 30 MG PO TABS: 30.0000 mg | ORAL_TABLET | Freq: Every day | ORAL | Status: DC

## 2014-05-20 NOTE — Progress Notes (Signed)
[facility] Progress Note  [name] [id number] 63 y.o.  05/20/2014 9:06 AM  Chief Complaint: I am doing better  History of Present Illness: Depression is improving and she is no longer crying at all.Feels depressed for a few hours about 4-5 days a week.  Appetite is variable but most days she wants a big dinner. Denies hopelessness and worthlessness. Anhedonia is improving and denies isolation.   Remeron is causing significant fatigue. She was sleeping all thru the night ( 7 hrs) and was also sleeping all day due to fatigue. Pt ran out Remeron 2 weeks ago and since notes she has multiple night time awakenings and is wanting to nap during the day. States she was taking Remeron at a variable hour each night and it takes about 45 min to make her sleepy after taking it.   Taking Prozac as prescribed and denies SE.   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 Negative for fever.  HENT: Negative for congestion, nosebleeds and sore throat.   Eyes: Negative for blurred vision, double vision and redness.  Respiratory: Negative for cough, sputum production and shortness of breath.   Cardiovascular: Negative for chest pain, palpitations and leg swelling.  Gastrointestinal: Negative for nausea, vomiting, diarrhea and constipation.  Musculoskeletal: Positive for back pain. Negative for joint pain and neck pain.  Neurological:  Positive for dizziness and sensory change. Negative for seizures and headaches.  Psychiatric/Behavioral: Positive for depression. Negative for suicidal ideas, hallucinations and substance abuse. The patient has insomnia. The patient is not nervous/anxious.      Past Medical Family, Social History: Lives with 3 daughters.Pt is retired and on disability.   reports that she quit smoking about 25 years ago. Her smoking use included Cigarettes. She started smoking about 40 years ago. She has a 15 pack-year smoking history. She has never used smokeless tobacco. She reports that she does not drink alcohol or use illicit drugs. Family History  Problem Relation Age of Onset  . Cancer Mother   . Alzheimer's disease Father   . Diabetes Brother   . Diabetes Maternal Grandmother   . Cancer Maternal Grandfather     lung  . Suicidality Neg Hx   . Depression Neg Hx   . Dementia Neg Hx   . Anxiety disorder Neg Hx     Past Medical History  Diagnosis Date  . stomach ca dx'd 2010    chemo/xrt comp 05/2009  . Hypertension   . Diabetes mellitus   . Asthma   . Sleep apnea   . Degenerative joint disease   . Hyperlipidemia   . GERD (gastroesophageal reflux disease)     Outpatient Encounter Prescriptions as of 05/20/2014  Medication Sig  . albuterol (PROVENTIL HFA;VENTOLIN HFA) 108 (90 BASE) MCG/ACT inhaler Inhale 2 puffs into the lungs every 4 (four) hours as needed for wheezing or shortness of breath.  Marland Kitchen albuterol (PROVENTIL) (2.5 MG/3ML) 0.083% nebulizer solution Use as directed 2.5 mg in the mouth or throat as needed for  wheezing. For shortness of breath  . carvedilol (COREG) 12.5 MG tablet Take 12.5 mg by mouth every morning.  Marland Kitchen FLUoxetine (PROZAC) 20 MG capsule Take 60 mg by mouth daily. TAKING A 40 MG AND A 20 MG  TO = 60 MG DAILY  . glimepiride (AMARYL) 4 MG tablet Take 4 mg by mouth 2 (two) times daily.   Marland Kitchen lidocaine-prilocaine (EMLA) cream Apply 1 application topically as needed.  .  metFORMIN (GLUCOPHAGE) 850 MG tablet Take 1 tablet (850 mg total) by mouth 2 (two) times daily with a meal.  . mirtazapine (REMERON) 15 MG tablet Take 1 tablet (15 mg total) by mouth at bedtime.  . montelukast (SINGULAIR) 10 MG tablet Take 10 mg by mouth at bedtime.    . Multiple Vitamin (MULTIVITAMIN WITH MINERALS) TABS tablet Take 1 tablet by mouth daily.  . prochlorperazine (COMPAZINE) 10 MG tablet Take 1 tablet (10 mg total) by mouth every 6 (six) hours as needed for nausea or vomiting.  . senna-docusate (SENOKOT-S) 8.6-50 MG per tablet Take 2 tablets by mouth daily.  . traMADol (ULTRAM) 50 MG tablet Take 1 tablet (50 mg total) by mouth every 8 (eight) hours as needed.  . triamterene-hydrochlorothiazide (DYAZIDE) 37.5-25 MG per capsule Take 1 capsule by mouth daily.     Past Psychiatric History/Hospitalization(s): Anxiety: No Bipolar Disorder: No Depression: Yes Mania: No Psychosis: No Schizophrenia: No Personality Disorder: No Hospitalization for psychiatric illness: No History of Electroconvulsive Shock Therapy: No Prior Suicide Attempts: No  Physical Exam: Constitutional:  BP 128/82  Pulse 72  Ht 5' 7"  (1.702 m)  Wt 249 lb 3.2 oz (113.036 kg)  BMI 39.02 kg/m2  General Appearance: alert, oriented, no acute distress and obese  Musculoskeletal: Strength & Muscle Tone: within normal limits Gait & Station: normal Patient leans: N/A  Mental Status Examination/Evaluation: Objective: Attitude: Calm and cooperative  Appearance: Casual and long, manicured nails, appears to be stated age  Eye Contact::  Good  Speech:  Clear and Coherent and Normal Rate  Volume:  Normal  Mood:  Depressed-mild  Affect:  Constricted  Thought Process:  Linear and Logical  Orientation:  Full (Time, Place, and Person)  Thought Content:  Negative  Suicidal Thoughts:  No  Homicidal Thoughts:  No  Judgement:  Fair  Insight:  Fair  Concentration: good  Memory:  Immediate-fair Recent-fair Remote-fair  Recall: fair  Language: fair  Gait and Station: normal  [employer] of Knowledge: average  Psychomotor Activity:  Normal  Akathisia:  No  Handed:  Right  AIMS (if indicated):  n/a  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  stomach ca  dx'd 2010      chemo/xrt comp 05/2009    .  Hypertension     .  Diabetes mellitus     .  Asthma     .  Sleep apnea     .  Degenerative joint disease     .  Hyperlipidemia     .  GERD (gastroesophageal reflux disease)  AXIS IV  economic problems and other psychosocial or environmental problems   AXIS V  51-60 moderate symptoms     Treatment Plan/Recommendations:  Plan of Care:  Medication management with supportive therapy. Risks/benefits and SE of the medication discussed. Pt verbalized understanding and verbal consent obtained for treatment.  Affirm with the patient that the medications are taken as ordered. Patient expressed understanding of how their medications were to be used.  - mild improvement of symptoms but still depressed    Laboratory: reviewed 04/28/2014 labs with pt Creat 1.3, gluc 165; CBC WNL,   Psychotherapy: Therapy: brief supportive therapy provided. Discussed psychosocial stressors in detail.   Medications:  Prozac to 55m qD for depression  Increase Remeron to 341mpo qHS for insomnia, appetite and depression. Pt is aware of SE of increased appetite and will monitor food intake due to her DM.    Routine PRN  Medications: No   Consultations: pt declined therapy at this point in time   Safety Concerns: Pt denies SI and is at an acute low risk for suicide.Patient told to call clinic if any problems occur. Patient advised to go to ER if they should develop SI/HI, side effects, or if symptoms worsen. Has crisis numbers to call if needed. Pt verbalized understanding.   Other: F/up in 3 months or sooner if needed    AGCharlcie CradleMD 05/20/2014

## 2014-05-22 ENCOUNTER — Other Ambulatory Visit (HOSPITAL_BASED_OUTPATIENT_CLINIC_OR_DEPARTMENT_OTHER): Payer: Medicare HMO | Admitting: Lab

## 2014-05-22 ENCOUNTER — Ambulatory Visit (HOSPITAL_BASED_OUTPATIENT_CLINIC_OR_DEPARTMENT_OTHER): Payer: Medicare HMO | Admitting: Hematology & Oncology

## 2014-05-22 ENCOUNTER — Ambulatory Visit (HOSPITAL_BASED_OUTPATIENT_CLINIC_OR_DEPARTMENT_OTHER): Payer: Medicare HMO

## 2014-05-22 ENCOUNTER — Ambulatory Visit: Payer: Self-pay | Admitting: Internal Medicine

## 2014-05-22 VITALS — BP 102/72 | HR 67 | Temp 98.1°F | Resp 18 | Wt 227.0 lb

## 2014-05-22 DIAGNOSIS — D508 Other iron deficiency anemias: Secondary | ICD-10-CM

## 2014-05-22 DIAGNOSIS — Z85028 Personal history of other malignant neoplasm of stomach: Secondary | ICD-10-CM

## 2014-05-22 DIAGNOSIS — C169 Malignant neoplasm of stomach, unspecified: Secondary | ICD-10-CM

## 2014-05-22 DIAGNOSIS — D509 Iron deficiency anemia, unspecified: Secondary | ICD-10-CM

## 2014-05-22 DIAGNOSIS — C801 Malignant (primary) neoplasm, unspecified: Secondary | ICD-10-CM

## 2014-05-22 LAB — CMP (CANCER CENTER ONLY)
ALK PHOS: 63 U/L (ref 26–84)
ALT: 14 U/L (ref 10–47)
AST: 20 U/L (ref 11–38)
Albumin: 3.4 g/dL (ref 3.3–5.5)
BILIRUBIN TOTAL: 0.4 mg/dL (ref 0.20–1.60)
BUN, Bld: 12 mg/dL (ref 7–22)
CO2: 27 mEq/L (ref 18–33)
Calcium: 9.3 mg/dL (ref 8.0–10.3)
Chloride: 104 mEq/L (ref 98–108)
Creat: 1.2 mg/dl (ref 0.6–1.2)
Glucose, Bld: 142 mg/dL — ABNORMAL HIGH (ref 73–118)
Potassium: 3.6 mEq/L (ref 3.3–4.7)
Sodium: 145 mEq/L (ref 128–145)
Total Protein: 6.9 g/dL (ref 6.4–8.1)

## 2014-05-22 LAB — IRON AND TIBC CHCC
%SAT: 29 % (ref 21–57)
IRON: 67 ug/dL (ref 41–142)
TIBC: 233 ug/dL — AB (ref 236–444)
UIBC: 165 ug/dL (ref 120–384)

## 2014-05-22 LAB — CBC WITH DIFFERENTIAL (CANCER CENTER ONLY)
BASO#: 0 10*3/uL (ref 0.0–0.2)
BASO%: 0.5 % (ref 0.0–2.0)
EOS ABS: 0.4 10*3/uL (ref 0.0–0.5)
EOS%: 4.5 % (ref 0.0–7.0)
HEMATOCRIT: 35.3 % (ref 34.8–46.6)
HEMOGLOBIN: 11.9 g/dL (ref 11.6–15.9)
LYMPH#: 2.7 10*3/uL (ref 0.9–3.3)
LYMPH%: 34.4 % (ref 14.0–48.0)
MCH: 30.7 pg (ref 26.0–34.0)
MCHC: 33.7 g/dL (ref 32.0–36.0)
MCV: 91 fL (ref 81–101)
MONO#: 0.7 10*3/uL (ref 0.1–0.9)
MONO%: 9.5 % (ref 0.0–13.0)
NEUT%: 51.1 % (ref 39.6–80.0)
NEUTROS ABS: 4 10*3/uL (ref 1.5–6.5)
Platelets: 328 10*3/uL (ref 145–400)
RBC: 3.87 10*6/uL (ref 3.70–5.32)
RDW: 13.8 % (ref 11.1–15.7)
WBC: 7.8 10*3/uL (ref 3.9–10.0)

## 2014-05-22 LAB — FERRITIN CHCC: FERRITIN: 360 ng/mL — AB (ref 9–269)

## 2014-05-22 MED ORDER — HEPARIN SOD (PORK) LOCK FLUSH 100 UNIT/ML IV SOLN
500.0000 [IU] | Freq: Once | INTRAVENOUS | Status: AC
  Administered 2014-05-22: 500 [IU] via INTRAVENOUS
  Filled 2014-05-22: qty 5

## 2014-05-22 MED ORDER — SODIUM CHLORIDE 0.9 % IJ SOLN
10.0000 mL | INTRAMUSCULAR | Status: DC | PRN
  Administered 2014-05-22: 10 mL via INTRAVENOUS
  Filled 2014-05-22: qty 10

## 2014-05-22 NOTE — Patient Instructions (Signed)

## 2014-05-22 NOTE — Progress Notes (Signed)
Hematology and Oncology Follow Up Visit  [name] [id number] Sep 06, 1950 63 y.o. 05/22/2014   Principle Diagnosis:   Stage IB (T1, N1, M0) adenocarcinoma of the stomach Non-insulin-dependent diabetes   Current Therapy:   Observation     Interim History:  Ms.  Jefferson is doing much better. She is much more happy. She now is seeing an internal medicine doctor. This, I think really has helped with her medical problems. She's not having any problems with abdominal pain. There's no nausea or vomiting. She did have some dizziness recently. She had MRI of the brain. It showed some subacute pontine infarcts. I'm not sure what these really mean. There is no obvious metastatic disease.  She does feel better. She is really not complain much of dizziness.  Her blood sugars have been doing pretty well.  She's had no change in bowel or bladder habits. She has had no bleeding. She has had no leg swelling.   Medications: Current outpatient prescriptions:albuterol (PROVENTIL HFA;VENTOLIN HFA) 108 (90 BASE) MCG/ACT inhaler, Inhale 2 puffs into the lungs every 4 (four) hours as needed for wheezing or shortness of breath., Disp: , Rfl: ;  albuterol (PROVENTIL) (2.5 MG/3ML) 0.083% nebulizer solution, Use as directed 2.5 mg in the mouth or throat as needed for wheezing. For shortness of breath, Disp: , Rfl:  carvedilol (COREG) 12.5 MG tablet, Take 12.5 mg by mouth every morning., Disp: , Rfl: ;  FLUoxetine (PROZAC) 20 MG capsule, Take 3 capsules (60 mg total) by mouth daily., Disp: 90 capsule, Rfl: 2;  glimepiride (AMARYL) 4 MG tablet, Take 4 mg by mouth 2 (two) times daily. , Disp: , Rfl: ;  lidocaine-prilocaine (EMLA) cream, Apply 1 application topically as needed., Disp: 30 g, Rfl: 12 metFORMIN (GLUCOPHAGE) 850 MG tablet, Take 1 tablet (850 mg total) by mouth 2 (two) times daily with a meal., Disp: 60 tablet, Rfl: 3;  mirtazapine (REMERON) 30 MG tablet, Take 1 tablet (30 mg total) by mouth at bedtime., Disp: 30  tablet, Rfl: 2;  montelukast (SINGULAIR) 10 MG tablet, Take 10 mg by mouth at bedtime.  , Disp: , Rfl: ;  Multiple Vitamin (MULTIVITAMIN WITH MINERALS) TABS tablet, Take 1 tablet by mouth daily., Disp: , Rfl:  prochlorperazine (COMPAZINE) 10 MG tablet, Take 1 tablet (10 mg total) by mouth every 6 (six) hours as needed for nausea or vomiting., Disp: 60 tablet, Rfl: 4;  senna-docusate (SENOKOT-S) 8.6-50 MG per tablet, Take 2 tablets by mouth daily., Disp: 20 tablet, Rfl: 0;  traMADol (ULTRAM) 50 MG tablet, Take 1 tablet (50 mg total) by mouth every 8 (eight) hours as needed., Disp: 30 tablet, Rfl: 3 triamterene-hydrochlorothiazide (DYAZIDE) 37.5-25 MG per capsule, Take 1 capsule by mouth daily. , Disp: , Rfl:   Allergies:  Allergies  Allergen Reactions  . Codeine Other (See Comments)    paranoid  . Zocor [Simvastatin - High Dose] Other (See Comments)    Muscle spasms  . Penicillins Rash    Past Medical History, Surgical history, Social history, and Family History were reviewed and updated.  Review of Systems: As above  Physical Exam:  weight is 227 lb (102.967 kg). Her oral temperature is 98.1 F (36.7 C). Her blood pressure is 102/72 and her pulse is 67. Her respiration is 18.   Obese African American female. Head and neck exam shows no ocular or oral lesions. She has no palpable cervical or supraclavicular lymph nodes. Lungs are clear. Cardiac exam regular in rhythm. Abdomen is soft. She  has well-healed laparotomy scar. There is no fluid wave. There is no palpable liver or spleen tip. Back exam shows no tenderness over the spine ribs or hips. Extremities shows no clubbing cyanosis or edema. Skin exam no rashes. Neurological exam is nonfocal.  Lab Results  Component Value Date   WBC 7.8 05/22/2014   HGB 11.9 05/22/2014   HCT 35.3 05/22/2014   MCV 91 05/22/2014   PLT 328 05/22/2014     Chemistry      Component Value Date/Time   NA 145 05/22/2014 0833   NA 142 12/27/2013 1440   NA  141 11/14/2013 0804   K 3.6 05/22/2014 0833   K 4.5 12/27/2013 1440   K 4.6 11/14/2013 0804   CL 104 05/22/2014 0833   CL 103 12/27/2013 1440   CO2 27 05/22/2014 0833   CO2 27 12/27/2013 1440   CO2 24 11/14/2013 0804   BUN 12 05/22/2014 0833   BUN 14 12/27/2013 1440   BUN 17.8 11/14/2013 0804   CREATININE 1.2 05/22/2014 0833   CREATININE 1.34* 12/27/2013 1440   CREATININE 1.7* 11/14/2013 0804      Component Value Date/Time   CALCIUM 9.3 05/22/2014 0833   CALCIUM 9.5 12/27/2013 1440   CALCIUM 10.4 11/14/2013 0804   ALKPHOS 63 05/22/2014 0833   ALKPHOS 63 12/27/2013 1440   ALKPHOS 75 11/14/2013 0804   AST 20 05/22/2014 0833   AST 25 12/27/2013 1440   AST 16 11/14/2013 0804   ALT 14 05/22/2014 0833   ALT 29 12/27/2013 1440   ALT 14 11/14/2013 0804   BILITOT 0.40 05/22/2014 0833   BILITOT 0.4 12/27/2013 1440   BILITOT 0.38 11/14/2013 0804     Ferritin is 492. Iron saturation 36%. Total iron is 88.   Impression and Plan: [name] is 63 year old African [place] female with stage IB stomach cancer. She underwent resection. She then had adjuvant radiation and chemotherapy. She completed this about 6 years ago.  I am glad that she is doing so well. She just looks better.  We will flush her Port-A-Cath.  We will have her back in 2 months for another visit and flush. Volanda Napoleon, MD 10/29/20157:04 PM

## 2014-05-23 ENCOUNTER — Encounter: Payer: Self-pay | Admitting: *Deleted

## 2014-05-26 ENCOUNTER — Ambulatory Visit (INDEPENDENT_AMBULATORY_CARE_PROVIDER_SITE_OTHER): Payer: Medicare HMO | Admitting: Internal Medicine

## 2014-05-26 ENCOUNTER — Encounter: Payer: Self-pay | Admitting: Internal Medicine

## 2014-05-26 VITALS — BP 110/64 | Temp 98.0°F | Resp 12 | Ht 67.0 in | Wt 250.6 lb

## 2014-05-26 DIAGNOSIS — E1165 Type 2 diabetes mellitus with hyperglycemia: Secondary | ICD-10-CM

## 2014-05-26 DIAGNOSIS — IMO0002 Reserved for concepts with insufficient information to code with codable children: Secondary | ICD-10-CM

## 2014-05-26 DIAGNOSIS — E11319 Type 2 diabetes mellitus with unspecified diabetic retinopathy without macular edema: Secondary | ICD-10-CM

## 2014-05-26 LAB — HEMOGLOBIN A1C: Hgb A1c MFr Bld: 7.3 % — ABNORMAL HIGH (ref 4.6–6.5)

## 2014-05-26 LAB — LIPID PANEL
CHOLESTEROL: 217 mg/dL — AB (ref 0–200)
HDL: 46.5 mg/dL (ref >39.00)
LDL Cholesterol: 140 mg/dL — ABNORMAL HIGH (ref 0–99)
NonHDL: 170.5
TRIGLYCERIDES: 152 mg/dL — AB (ref 0.0–149.0)
Total CHOL/HDL Ratio: 5
VLDL: 30.4 mg/dL (ref 0.0–40.0)

## 2014-05-26 MED ORDER — GLIMEPIRIDE 4 MG PO TABS: 4.0000 mg | ORAL_TABLET | Freq: Two times a day (BID) | ORAL | Status: DC

## 2014-05-26 NOTE — Patient Instructions (Signed)
Please continue Metformin 850 mg 2x a day with meals. Continue Amaryl 4 mg 2x a day before meals.  Try to eat something for breakfast every day.  Please stop at the lab.  Please return in 1.5 months with your sugar log.

## 2014-05-26 NOTE — Progress Notes (Signed)
Patient ID: ARYAA BUNTING, female   DOB: May 31, 1951, 63 y.o.   MRN: [id number]  HPI: BENNY HENRIE is a 63 y.o.-year-old female, referred by her PCP, Dr. 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  Patient has been diagnosed with diabetes in ~1995; she has been on insulin at dx >> then stopped it when had gastric cancer and lost weight (per her report, ~100 lbs) >> did not need to take the insulin anymore.   Last hemoglobin A1c was: Lab Results  Component Value Date   HGBA1C 8.2* 11/14/2013   HGBA1C 9.2* 05/31/2013   HGBA1C 8.5* 11/30/2012   Pt is on a regimen of: - Metformin 850 mg po bid - Amaryl 4 mg bid  Pt checks her sugars 2x a day and they are: - am: 96-135, 165 x1 - 2h after b'fast: n/c - before lunch: n/c - 2h after lunch: n/c - before dinner: n/c - 2h after dinner: n/c - bedtime: 140-155 - nighttime: n/c No lows recently. Lowest sugar was 45 x1, >1 year ago; she has hypoglycemia awareness at 50.  Highest sugar was higher 100s.  Pt's meals are: - Breakfast: skips usually - Lunch: sandwich - Dinner: meat + vegetables+ starch - Snacks: fruit: apples - only after dinner  - + mild CKD, last BUN/creatinine:  Lab Results  Component Value Date   BUN 12 05/22/2014   CREATININE 1.2 05/22/2014  Not on ACEI.  - last set of lipids: Lab Results  Component Value Date   CHOL 193 11/30/2012   HDL 42 11/30/2012   LDLCALC 124* 11/30/2012   TRIG 133 11/30/2012   CHOLHDL 4.6 11/30/2012  She tries Zocor in the past >> joint pain. Last Lipid panel was not on Zocor.  - last eye exam was in 02/2014. + DR. Has Laser sx in both eyes.  - + numbness and tingling in her feet.  Pt has FH of DM in grandmother.  ROS: Constitutional: + weight loss, + feeling cold, no fatigue, + nocturia Eyes: no blurry vision, no xerophthalmia ENT: no sore throat, no nodules palpated in throat, no dysphagia/odynophagia, no  hoarseness Cardiovascular: no CP/SOB/palpitations/leg swelling Respiratory: no cough/SOB Gastrointestinal: no N/V/D/C Musculoskeletal: no muscle/joint aches Skin: no rashes Neurological: no tremors/numbness/tingling/dizziness Psychiatric: + depression/no anxiety  Past Medical History  Diagnosis Date  . stomach ca dx'd 2010    chemo/xrt comp 05/2009  . Hypertension   . Diabetes mellitus   . Asthma   . Sleep apnea   . Degenerative joint disease   . Hyperlipidemia   . GERD (gastroesophageal reflux disease)    Past Surgical History  Procedure Laterality Date  . Total knee arthroplasty      Left  . Total hip arthroplasty      Right  . Shoulder surgery      Left  . Appendectomy    . Cesarean section    . Hernia repair    . Colon surgery    . Esophagogastroduodenoscopy N/A 10/15/2012    Procedure: ESOPHAGOGASTRODUODENOSCOPY (EGD);  Surgeon: Lear Ng, MD;  Location: [facility];  Service: Endoscopy;  Laterality: N/A;   History   Social History  . Marital Status: Divorced    Spouse Name: N/A    Number of Children: 4   Occupational History  . retired   Social History Main Topics  . Smoking status: Former Smoker -- 1.00 packs/day for 15 years    Types: Cigarettes  Start date: 09/27/1973    Quit date: 10/15/1988  . Smokeless tobacco: Never Used     Comment: quit smoking 14 years ago  . Alcohol Use: No  . Drug Use: No   Social History Narrative   Current Outpatient Prescriptions on File Prior to Visit  Medication Sig Dispense Refill  . albuterol (PROVENTIL HFA;VENTOLIN HFA) 108 (90 BASE) MCG/ACT inhaler Inhale 2 puffs into the lungs every 4 (four) hours as needed for wheezing or shortness of breath.    Marland Kitchen albuterol (PROVENTIL) (2.5 MG/3ML) 0.083% nebulizer solution Use as directed 2.5 mg in the mouth or throat as needed for wheezing. For shortness of breath    . carvedilol (COREG) 12.5 MG tablet Take 12.5 mg by mouth every morning.    Marland Kitchen FLUoxetine  (PROZAC) 20 MG capsule Take 3 capsules (60 mg total) by mouth daily. 90 capsule 2  . glimepiride (AMARYL) 4 MG tablet Take 4 mg by mouth 2 (two) times daily.     Marland Kitchen lidocaine-prilocaine (EMLA) cream Apply 1 application topically as needed. 30 g 12  . metFORMIN (GLUCOPHAGE) 850 MG tablet Take 1 tablet (850 mg total) by mouth 2 (two) times daily with a meal. 60 tablet 3  . mirtazapine (REMERON) 30 MG tablet Take 1 tablet (30 mg total) by mouth at bedtime. 30 tablet 2  . montelukast (SINGULAIR) 10 MG tablet Take 10 mg by mouth at bedtime.      . Multiple Vitamin (MULTIVITAMIN WITH MINERALS) TABS tablet Take 1 tablet by mouth daily.    . prochlorperazine (COMPAZINE) 10 MG tablet Take 1 tablet (10 mg total) by mouth every 6 (six) hours as needed for nausea or vomiting. 60 tablet 4  . senna-docusate (SENOKOT-S) 8.6-50 MG per tablet Take 2 tablets by mouth daily. 20 tablet 0  . traMADol (ULTRAM) 50 MG tablet Take 1 tablet (50 mg total) by mouth every 8 (eight) hours as needed. 30 tablet 3  . triamterene-hydrochlorothiazide (DYAZIDE) 37.5-25 MG per capsule Take 1 capsule by mouth daily.      No current facility-administered medications on file prior to visit.   Allergies  Allergen Reactions  . Codeine Other (See Comments)    paranoid  . Zocor [Simvastatin - High Dose] Other (See Comments)    Muscle spasms  . Penicillins Rash   Family History  Problem Relation Age of Onset  . Cancer Mother   . Alzheimer's disease Father   . Diabetes Brother   . Diabetes Maternal Grandmother   . Cancer Maternal Grandfather     lung  . Suicidality Neg Hx   . Depression Neg Hx   . Dementia Neg Hx   . Anxiety disorder Neg Hx    PE: BP 110/64 mmHg  Temp(Src) 98 F (36.7 C) (Oral)  Resp 12  Ht 5' 7"  (1.702 m)  Wt 250 lb 9.6 oz (113.671 kg)  BMI 39.24 kg/m2 Wt Readings from Last 3 Encounters:  05/26/14 250 lb 9.6 oz (113.671 kg)  05/22/14 227 lb (102.967 kg)  05/20/14 249 lb 3.2 oz (113.036 kg)    Constitutional: overweight, in NAD Eyes: PERRLA, EOMI, no exophthalmos ENT: moist mucous membranes, no thyromegaly, no cervical lymphadenopathy Cardiovascular: RRR, No MRG Respiratory: CTA B Gastrointestinal: abdomen soft, NT, ND, BS+ Musculoskeletal: no deformities, strength intact in all 4 Skin: moist, warm, no rashes Neurological: no tremor with outstretched hands, DTR normal in all 4  ASSESSMENT: 1. 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 Patient with long-standing, uncontrolled diabetes, on oral antidiabetic regimen, with apparently good control per her report - no log. We do not have a recent HbA1c >> will need this checked now. - We discussed about options for treatment, and I suggested to:  Patient Instructions  Please continue Metformin 850 mg 2x a day with meals. Continue Amaryl 4 mg 2x a day before meals.  Try to eat something for breakfast every day.  Please stop at the lab.  Please return in 1.5 months with your sugar log.   - Strongly advised her to start checking sugars at different times of the day - check 2 times a day, rotating checks - given sugar log and advised how to fill it and to bring it at next appt  - given foot care handout and explained the principles  - given instructions for hypoglycemia management "15-15 rule"  - advised for yearly eye exams  - she is up to date - will check Lipid panel (she is fasting) and a HbA1c - we discussed improving her diet  - start incorporating b'fast - advised that with Amaryl, she may have hypoglycemia if she takes it and does not eat afterwards - Return to clinic in 1.5 mo with sugar log   - time spent with the patient: 45 min, of which >50% was spent in obtaining information about her diabetes history, reviewing her previous labs, evaluations, and treatments, reviewing her sugars and HbA1c levels, counseling her about her condition (please see the discussed  topics above), and developing a plan to further investigate it.   Office Visit on 05/26/2014  Component Date Value Ref Range Status  . Hgb A1c MFr Bld 05/26/2014 7.3* 4.6 - 6.5 % Final   Glycemic Control Guidelines for People with Diabetes:Non Diabetic:  <6%Goal of Therapy: <7%Additional Action Suggested:  >8%   . Cholesterol 05/26/2014 217* 0 - 200 mg/dL Final   ATP III Classification       Desirable:  < 200 mg/dL               Borderline High:  200 - 239 mg/dL          High:  > = 240 mg/dL  . Triglycerides 05/26/2014 152.0* 0.0 - 149.0 mg/dL Final   Normal:  <150 mg/dLBorderline High:  150 - 199 mg/dL  . HDL 05/26/2014 46.50  >39.00 mg/dL Final  . VLDL 05/26/2014 30.4  0.0 - 40.0 mg/dL Final  . LDL Cholesterol 05/26/2014 140* 0 - 99 mg/dL Final  . Total CHOL/HDL Ratio 05/26/2014 5   Final                  Men          Women1/2 Average Risk     3.4          3.3Average Risk          5.0          4.42X Average Risk          9.6          7.13X Average Risk          15.0          11.0                      . NonHDL 05/26/2014 170.50   Final   NOTE:  Non-HDL goal should be 30 mg/dL higher than patient's LDL goal (i.e. LDL goal  of < 70 mg/dL, would have non-HDL goal of < 100 mg/dL)   HbA1c very close to target. Will continue with the plan above. LDL high, will discuss with her about retrying a statin at next visit.

## 2014-05-27 ENCOUNTER — Ambulatory Visit (INDEPENDENT_AMBULATORY_CARE_PROVIDER_SITE_OTHER): Payer: Medicare HMO | Admitting: Internal Medicine

## 2014-05-27 ENCOUNTER — Encounter: Payer: Self-pay | Admitting: Internal Medicine

## 2014-05-27 VITALS — BP 112/70 | HR 43 | Temp 98.6°F | Resp 16 | Ht 67.0 in | Wt 250.0 lb

## 2014-05-27 DIAGNOSIS — E119 Type 2 diabetes mellitus without complications: Secondary | ICD-10-CM

## 2014-05-27 DIAGNOSIS — Z23 Encounter for immunization: Secondary | ICD-10-CM

## 2014-05-27 DIAGNOSIS — I10 Essential (primary) hypertension: Secondary | ICD-10-CM

## 2014-05-27 DIAGNOSIS — F329 Major depressive disorder, single episode, unspecified: Secondary | ICD-10-CM

## 2014-05-27 NOTE — Progress Notes (Signed)
Subjective:    Patient ID: [name], female    DOB: 08/03/1950, 63 y.o.   MRN: [id number]  HPI  05/2014  Endocrine note   PLAN:  1. Patient with long-standing, uncontrolled diabetes, on oral antidiabetic regimen, with apparently good control per her report - no log. We do not have a recent HbA1c >> will need this checked now. - We discussed about options for treatment, and I suggested to:  Patient Instructions  Please continue Metformin 850 mg 2x a day with meals. Continue Amaryl 4 mg 2x a day before meals.  Try to eat something for breakfast every day.  Please stop at the lab.  Please return in 1.5 months with your sugar log.   - Strongly advised her to start checking sugars at different times of the day - check 2 times a day, rotating checks - given sugar log and advised how to fill it and to bring it at next appt  - given foot care handout and explained the principles  - given instructions for hypoglycemia management "15-15 rule"  - advised for yearly eye exams - she is up to date - will check Lipid panel (she is fasting) and a HbA1c - we discussed improving her diet - start incorporating b'fast - advised that with Amaryl, she may have hypoglycemia if she takes it and does not eat afterwards - Return to clinic in 1.5 mo with sugar log  - time spent with the patient: 45 min, of which >50% was spent in obtaining information about her diabetes history, reviewing her previous labs, evaluations, and treatments, reviewing her sugars and HbA1c levels, counseling her about her condition (please see the discussed topics above), and developing a plan to further investigate it.   01/2014 note HTN; Continue meds  Hyperlipidemia Lipids pending  DM will refer to Dr. Cruzita Lederer  Minimally Elevated creatinine. mulitple culprits including HTN and DM. Ct done 3/15 shows no stone or hydro She is not on ACE or ARB but will leave to discretion of endocrinology    Depression: Improving conitnue meds as per [facility]  HM: To xray to schedule her MM today. Advised to call her GYN or pelvic and pap  See me in 3 months or sooner prn               Today  Pt reports she is doing well.  Had some dizziness that improved after infusion treatment from Dr. Marin Olp   See [facility]  Improving  She did see Dr. Cruzita Lederer yesterday   She needs flu vaccine  .   Allergies  Allergen Reactions  . Codeine Other (See Comments)    paranoid  . Zocor [Simvastatin - High Dose] Other (See Comments)    Muscle spasms  . Penicillins Rash   Past Medical History  Diagnosis Date  . stomach ca dx'd 2010    chemo/xrt comp 05/2009  . Hypertension   . Diabetes mellitus   . Asthma   . Sleep apnea   . Degenerative joint disease   . Hyperlipidemia   . GERD (gastroesophageal reflux disease)    Past Surgical History  Procedure Laterality Date  . Total knee arthroplasty      Left  . Total hip arthroplasty      Right  . Shoulder surgery      Left  . Appendectomy    . Cesarean section    . Hernia repair    . Colon surgery    . Esophagogastroduodenoscopy N/A  10/15/2012    Procedure: ESOPHAGOGASTRODUODENOSCOPY (EGD);  Surgeon: Lear Ng, MD;  Location: [facility];  Service: Endoscopy;  Laterality: N/A;   History   Social History  . Marital Status: Divorced    Spouse Name: N/A    Number of Children: N/A  . Years of Education: N/A   Occupational History  . Not on file.   Social History Main Topics  . Smoking status: Former Smoker -- 1.00 packs/day for 15 years    Types: Cigarettes    Start date: 09/27/1973    Quit date: 10/15/1988  . Smokeless tobacco: Never Used     Comment: quit smoking 14 years ago  . Alcohol Use: No  . Drug Use: No  . Sexual Activity: No   Other Topics Concern  . Not on file   Social History Narrative   Family History  Problem Relation Age of Onset  . Cancer Mother   . Alzheimer's disease Father   .  Diabetes Brother   . Diabetes Maternal Grandmother   . Cancer Maternal Grandfather     lung  . Suicidality Neg Hx   . Depression Neg Hx   . Dementia Neg Hx   . Anxiety disorder Neg Hx    Patient Active Problem List   Diagnosis Date Noted  . Iron deficiency anemia 05/20/2014  . Major depressive disorder, single episode, moderate 05/20/2014  . Dizziness of unknown cause 04/28/2014  . MDD (major depressive disorder), recurrent episode, moderate 01/28/2014  . GERD (gastroesophageal reflux disease) 01/02/2014  . Nausea 10/15/2012  . Stomach cancer 12/10/2008  . CHRONIC RESPIRATORY FAILURE 01/03/2008  . DIABETES MELLITUS, TYPE II 06/27/2007  . HYPERLIPIDEMIA 06/27/2007  . OBESITY, MORBID 06/27/2007  . SLEEP APNEA, OBSTRUCTIVE 06/27/2007  . HYPERTENSION 06/27/2007  . ASTHMA 06/27/2007  . OSTEOARTHRITIS  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 06/27/2007   Current Outpatient Prescriptions on File Prior to Visit  Medication Sig Dispense Refill  . albuterol (PROVENTIL HFA;VENTOLIN HFA) 108 (90 BASE) MCG/ACT inhaler Inhale 2 puffs into the lungs every 4 (four) hours as needed for wheezing or shortness of breath.    Marland Kitchen albuterol (PROVENTIL) (2.5 MG/3ML) 0.083% nebulizer solution Use as directed 2.5 mg in the mouth or throat as needed for wheezing. For shortness of breath    . carvedilol (COREG) 12.5 MG tablet Take 12.5 mg by mouth every morning.    Marland Kitchen FLUoxetine (PROZAC) 20 MG capsule Take 3 capsules (60 mg total) by mouth daily. 90 capsule 2  . glimepiride (AMARYL) 4 MG tablet Take 1 tablet (4 mg total) by mouth 2 (two) times daily. 90 tablet 1  . lidocaine-prilocaine (EMLA) cream Apply 1 application topically as needed. 30 g 12  . metFORMIN (GLUCOPHAGE) 850 MG tablet Take 1 tablet (850 mg total) by mouth 2 (two) times daily with a meal. 60 tablet 3  . mirtazapine (REMERON) 30 MG tablet Take 1 tablet (30 mg total) by mouth at bedtime. 30 tablet 2  . montelukast (SINGULAIR) 10 MG tablet Take 10 mg by  mouth at bedtime.      . Multiple Vitamin (MULTIVITAMIN WITH MINERALS) TABS tablet Take 1 tablet by mouth daily.    . prochlorperazine (COMPAZINE) 10 MG tablet Take 1 tablet (10 mg total) by mouth every 6 (six) hours as needed for nausea or vomiting. 60 tablet 4  . senna-docusate (SENOKOT-S) 8.6-50 MG per tablet Take 2 tablets by mouth daily. 20 tablet 0  . traMADol (ULTRAM) 50 MG tablet Take  1 tablet (50 mg total) by mouth every 8 (eight) hours as needed. 30 tablet 3  . triamterene-hydrochlorothiazide (DYAZIDE) 37.5-25 MG per capsule Take 1 capsule by mouth daily.      No current facility-administered medications on file prior to visit.       Review of Systems    see HPI Objective:   Physical Exam  Physical Exam  Nursing note and vitals reviewed.   Repeat HR  65 Constitutional: She is oriented to person, place, and time. She appears well-developed and well-nourished.  HENT:  Head: Normocephalic and atraumatic.  Cardiovascular: Normal rate and regular rhythm. Exam reveals no gallop and no friction rub.  No murmur heard.  Pulmonary/Chest: Breath sounds normal. She has no wheezes. She has no rales.  Neurological: She is alert and oriented to person, place, and time.  Skin: Skin is warm and dry.  Psychiatric: She has a normal mood and affect. Her behavior is normal.       Assessment & Plan:  DM  See endocrine note    HTN  Continue meds  Depression  Managed BH  Flu vaccine today   See me at CPE already scheduled

## 2014-06-21 ENCOUNTER — Other Ambulatory Visit: Payer: Self-pay | Admitting: Hematology & Oncology

## 2014-07-08 ENCOUNTER — Ambulatory Visit: Payer: Self-pay | Admitting: Internal Medicine

## 2014-07-31 ENCOUNTER — Encounter: Payer: Self-pay | Admitting: Internal Medicine

## 2014-07-31 ENCOUNTER — Ambulatory Visit (INDEPENDENT_AMBULATORY_CARE_PROVIDER_SITE_OTHER): Payer: Medicare HMO | Admitting: Internal Medicine

## 2014-07-31 VITALS — BP 114/71 | HR 78 | Resp 16 | Ht 67.0 in | Wt 258.0 lb

## 2014-07-31 DIAGNOSIS — IMO0002 Reserved for concepts with insufficient information to code with codable children: Secondary | ICD-10-CM

## 2014-07-31 DIAGNOSIS — F331 Major depressive disorder, recurrent, moderate: Secondary | ICD-10-CM

## 2014-07-31 DIAGNOSIS — E11319 Type 2 diabetes mellitus with unspecified diabetic retinopathy without macular edema: Secondary | ICD-10-CM

## 2014-07-31 DIAGNOSIS — R42 Dizziness and giddiness: Secondary | ICD-10-CM

## 2014-07-31 DIAGNOSIS — R930 Abnormal findings on diagnostic imaging of skull and head, not elsewhere classified: Secondary | ICD-10-CM

## 2014-07-31 DIAGNOSIS — E1165 Type 2 diabetes mellitus with hyperglycemia: Secondary | ICD-10-CM

## 2014-07-31 MED ORDER — ALBUTEROL SULFATE HFA 108 (90 BASE) MCG/ACT IN AERS: INHALATION_SPRAY | RESPIRATORY_TRACT | Status: DC

## 2014-07-31 MED ORDER — MECLIZINE HCL 25 MG PO TABS: 25.0000 mg | ORAL_TABLET | Freq: Three times a day (TID) | ORAL | Status: DC | PRN

## 2014-07-31 NOTE — Progress Notes (Addendum)
   Subjective:    Patient ID: [name], female    DOB: [birth year], 64 y.o.   MRN: [id number]  HPI [name] is here for acute visit  She reports dizziness for the past several months described as a spinning sensation.     See MRI of 05/03/2014   Suggestion of subacute infarcts.  She denies LOC , visual or  Speech changes, new numbness (she has chronic neuropathy of feet)  or motor weakness.   No chest pain or palpitations.    She has seen endocrinology who is managing her diabetes   AIC is below 8%  She has not had repeat MRI.    She denies history of GI bleed    Review of Systems See HPI    Objective:   Physical Exam Physical Exam  Nursing note and vitals reviewed.  Constitutional: She is oriented to person, place, and time. She appears well-developed and well-nourished.  HENT:  Head: Normocephalic and atraumatic.  Cardiovascular: Normal rate and regular rhythm. Exam reveals no gallop and no friction rub.  No murmur heard.  Pulmonary/Chest: Breath sounds normal. She has no wheezes. She has no rales.  Neurological: She is alert and oriented to person, place, and time.  CNII-XII intact Motor no pronator drift no tremor 5/5 Sensory decreased sensation  LE decreased microfilament  REexes  Decreased patellar bilaterally Cebrebellar  Slow  Skin: Skin is warm and dry.  Psychiatric: She has a normal mood and affect. Her behavior is normal.         Assessment & Plan:  Dizziness /abnormal MRI with question of subacute pontine infarct.  Will get follow up MRI  Advised to take baby ASA one daily .   Further management based on results .  Will also refer to neurology    Subjective vertigo  Will give antivert.  DM managed by endocrinology  Stomach CA    MDD    Addendum 1/20  Follow up MRI suggestive of prior infarct . Will get carotid dopplers and 2D echo prior to neurology visit

## 2014-07-31 NOTE — Patient Instructions (Addendum)
To lab today   Will schedule MRI

## 2014-08-01 LAB — CBC WITH DIFFERENTIAL/PLATELET
BASOS ABS: 0.1 10*3/uL (ref 0.0–0.1)
Basophils Relative: 1 % (ref 0–1)
EOS PCT: 5 % (ref 0–5)
Eosinophils Absolute: 0.3 10*3/uL (ref 0.0–0.7)
HEMATOCRIT: 38.1 % (ref 36.0–46.0)
Hemoglobin: 12.3 g/dL (ref 12.0–15.0)
LYMPHS ABS: 2.5 10*3/uL (ref 0.7–4.0)
LYMPHS PCT: 37 % (ref 12–46)
MCH: 28.7 pg (ref 26.0–34.0)
MCHC: 32.3 g/dL (ref 30.0–36.0)
MCV: 88.8 fL (ref 78.0–100.0)
MONO ABS: 0.5 10*3/uL (ref 0.1–1.0)
MPV: 10.7 fL (ref 8.6–12.4)
Monocytes Relative: 8 % (ref 3–12)
Neutro Abs: 3.3 10*3/uL (ref 1.7–7.7)
Neutrophils Relative %: 49 % (ref 43–77)
Platelets: 369 10*3/uL (ref 150–400)
RBC: 4.29 MIL/uL (ref 3.87–5.11)
RDW: 13.9 % (ref 11.5–15.5)
WBC: 6.8 10*3/uL (ref 4.0–10.5)

## 2014-08-01 LAB — COMPREHENSIVE METABOLIC PANEL
ALBUMIN: 3.8 g/dL (ref 3.5–5.2)
ALT: 12 U/L (ref 0–35)
AST: 15 U/L (ref 0–37)
Alkaline Phosphatase: 81 U/L (ref 39–117)
BUN: 12 mg/dL (ref 6–23)
CO2: 20 mEq/L (ref 19–32)
Calcium: 9.7 mg/dL (ref 8.4–10.5)
Chloride: 103 mEq/L (ref 96–112)
Creat: 1.05 mg/dL (ref 0.50–1.10)
Glucose, Bld: 196 mg/dL — ABNORMAL HIGH (ref 70–99)
POTASSIUM: 4.3 meq/L (ref 3.5–5.3)
Sodium: 141 mEq/L (ref 135–145)
Total Bilirubin: 0.4 mg/dL (ref 0.2–1.2)
Total Protein: 7 g/dL (ref 6.0–8.3)

## 2014-08-01 LAB — TSH: TSH: 2.435 u[IU]/mL (ref 0.350–4.500)

## 2014-08-05 NOTE — Progress Notes (Signed)
Labs mailed to patient -eh

## 2014-08-09 ENCOUNTER — Ambulatory Visit (HOSPITAL_BASED_OUTPATIENT_CLINIC_OR_DEPARTMENT_OTHER)
Admission: RE | Admit: 2014-08-09 | Discharge: 2014-08-09 | Disposition: A | Discharge status: Home / Self Care | Payer: Medicare HMO | Source: Ambulatory Visit | Attending: Internal Medicine | Admitting: Internal Medicine

## 2014-08-09 DIAGNOSIS — R9089 Other abnormal findings on diagnostic imaging of central nervous system: Secondary | ICD-10-CM | POA: Insufficient documentation

## 2014-08-09 DIAGNOSIS — R42 Dizziness and giddiness: Secondary | ICD-10-CM | POA: Diagnosis present

## 2014-08-11 ENCOUNTER — Telehealth: Payer: Self-pay | Admitting: Internal Medicine

## 2014-08-11 DIAGNOSIS — Z8673 Personal history of transient ischemic attack (TIA), and cerebral infarction without residual deficits: Secondary | ICD-10-CM | POA: Insufficient documentation

## 2014-08-11 DIAGNOSIS — I639 Cerebral infarction, unspecified: Secondary | ICD-10-CM

## 2014-08-11 NOTE — Telephone Encounter (Signed)
[name] is aware that she has an appointment with Dr. Tomi Likens on 09/03/14 @ 3pm-eh

## 2014-08-11 NOTE — Telephone Encounter (Signed)
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  Will refer to neurology and advised ot continue baby ASA daily.  If any furrther dizziness or symptoms just mentioned develop she is to go to ER  She voices understanding

## 2014-08-11 NOTE — Progress Notes (Signed)
A copy of the labs were mailed to patient -eh

## 2014-08-13 ENCOUNTER — Telehealth: Payer: Self-pay | Admitting: Internal Medicine

## 2014-08-13 DIAGNOSIS — I639 Cerebral infarction, unspecified: Secondary | ICD-10-CM

## 2014-08-13 NOTE — Addendum Note (Signed)
Addended by: Emi Belfast D on: 08/13/2014 08:39 AM   Modules accepted: Level of Service

## 2014-08-13 NOTE — Telephone Encounter (Signed)
Spoke with pt and will get carotid doppler and 2-D ECHO prior to neurology visit

## 2014-08-13 NOTE — Telephone Encounter (Signed)
Called pt to adv 2-d echo and doppler has been scheduled 08/20/14 at 9:00am at [facility]. Pt expressed understanding.

## 2014-08-20 ENCOUNTER — Ambulatory Visit (HOSPITAL_BASED_OUTPATIENT_CLINIC_OR_DEPARTMENT_OTHER)
Admission: RE | Admit: 2014-08-20 | Discharge: 2014-08-20 | Disposition: A | Discharge status: Home / Self Care | Payer: Medicare HMO | Source: Ambulatory Visit | Attending: Internal Medicine | Admitting: Internal Medicine

## 2014-08-20 ENCOUNTER — Ambulatory Visit (HOSPITAL_BASED_OUTPATIENT_CLINIC_OR_DEPARTMENT_OTHER): Admission: RE | Admit: 2014-08-20 | Payer: Medicare HMO | Source: Ambulatory Visit

## 2014-08-20 DIAGNOSIS — I639 Cerebral infarction, unspecified: Secondary | ICD-10-CM | POA: Diagnosis present

## 2014-08-20 DIAGNOSIS — R42 Dizziness and giddiness: Secondary | ICD-10-CM | POA: Diagnosis not present

## 2014-08-20 DIAGNOSIS — E042 Nontoxic multinodular goiter: Secondary | ICD-10-CM | POA: Insufficient documentation

## 2014-08-21 ENCOUNTER — Ambulatory Visit (HOSPITAL_COMMUNITY): Payer: Self-pay | Admitting: Psychiatry

## 2014-08-25 ENCOUNTER — Telehealth: Payer: Self-pay | Admitting: *Deleted

## 2014-08-25 NOTE — Telephone Encounter (Signed)
-----   Message from Lanice Shirts, MD sent at 08/24/2014 11:18 AM EST ----- Call pt and let her know the her blood flow to her brain is good - no blockage seen.  She does have nodules in her thryroid that we need to discuss at her next office visit.    Advise her to be sure to keep appt with me on 2/9

## 2014-08-25 NOTE — Telephone Encounter (Signed)
I spoke with spoke in regards to her results and advising that she keeps her next appointment with Dr, Coralyn Mark

## 2014-09-02 ENCOUNTER — Encounter: Payer: Self-pay | Admitting: Internal Medicine

## 2014-09-03 ENCOUNTER — Ambulatory Visit (INDEPENDENT_AMBULATORY_CARE_PROVIDER_SITE_OTHER): Payer: Medicare HMO | Admitting: Neurology

## 2014-09-03 ENCOUNTER — Encounter: Payer: Self-pay | Admitting: Neurology

## 2014-09-03 VITALS — BP 140/78 | HR 68 | Temp 98.3°F | Resp 18 | Ht 67.0 in | Wt 257.5 lb

## 2014-09-03 DIAGNOSIS — E785 Hyperlipidemia, unspecified: Secondary | ICD-10-CM

## 2014-09-03 DIAGNOSIS — I639 Cerebral infarction, unspecified: Secondary | ICD-10-CM

## 2014-09-03 DIAGNOSIS — R42 Dizziness and giddiness: Secondary | ICD-10-CM

## 2014-09-03 DIAGNOSIS — E1110 Type 2 diabetes mellitus with ketoacidosis without coma: Secondary | ICD-10-CM

## 2014-09-03 DIAGNOSIS — E1310 Other specified diabetes mellitus with ketoacidosis without coma: Secondary | ICD-10-CM

## 2014-09-03 NOTE — Patient Instructions (Addendum)
1.  Continue aspirin 78m daily 2.  I do recommend discussing with Dr. SCoralyn Markabout starting another statin because we need to lower your cholesterol 3.  Mediterranean diet 4.  Moderate exercise (walking) daily for 20-30 minutes 5.  [facility]D echocardiogram  [facility] 6.  [facility] 7:45 am  7.  Follow up in 3 months.

## 2014-09-03 NOTE — Progress Notes (Signed)
NEUROLOGY CONSULTATION NOTE  [name] MRN: [id number] DOB: 1951-07-15  Referring provider: Dr. Coralyn Mark Primary care provider: Dr. Coralyn Mark  Reason for consult:  Stroke  HISTORY OF PRESENT ILLNESS: [name] is a 65 year old right-handed woman with type II diabetes mellitus with neuropathy, major depressive disorder, hypertension, and stomach cancer who presents for dizziness.  Records, labs and MRI of brain reviewed.  For several months, she has had recurrent episodes of spinning spells.  It would occur for just a couple of seconds, however it would happen for 2 or three months at a time and then she would be symptom-free for 2 or 3 months.  It is sometimes associated with nausea.  She denied facial droop, slurred speech, lightheadedness or focal numbness and weakness.  It would occur spontaneously and she mostly notices it while laying in bed.  One time, she fell on her face because of the dizziness.  She had an MRI of the brain with and without contrast performed on 05/03/14, which showed subacute infarcts in the pons but no metastatic disease.  Symptoms persisted, so she had another MRI of the brain with and without contrast performed on 08/09/14, which revealed no new infarcts.  Carotid doppler performed on 08/20/14 which revealed no hemodynamically significant ICA or vertebral artery stenosis.  Last Hgb A1c from November was 7.3 and trending down.  Cholesterol was 217, triglycerides 152, HDL 46.50 and LDL of 140.  She was started on ASA 58m daily.  She previously was on simvastatin but was unable to tolerate it.  PAST MEDICAL HISTORY: Past Medical History  Diagnosis Date  . stomach ca dx'd 2010    chemo/xrt comp 05/2009  . Hypertension   . Diabetes mellitus   . Asthma   . Sleep apnea   . Degenerative joint disease   . Hyperlipidemia   . GERD (gastroesophageal reflux disease)     PAST SURGICAL HISTORY: Past Surgical History  Procedure Laterality Date  . Total knee  arthroplasty      Left  . Total hip arthroplasty      Right  . Shoulder surgery      Left  . Appendectomy    . Cesarean section    . Hernia repair    . Colon surgery    . Esophagogastroduodenoscopy N/A 10/15/2012    Procedure: ESOPHAGOGASTRODUODENOSCOPY (EGD);  Surgeon: VLear Ng MD;  [facility];  Service: Endoscopy;  Laterality: N/A;    MEDICATIONS: Current Outpatient Prescriptions on File Prior to Visit  Medication Sig Dispense Refill  . albuterol (PROVENTIL HFA;VENTOLIN HFA) 108 (90 BASE) MCG/ACT inhaler Inhale 2 puffs into lungs every 8 hours prn wheezing 1 Inhaler 0  . carvedilol (COREG) 12.5 MG tablet Take 12.5 mg by mouth every morning.    .Marland KitchenFLUoxetine (PROZAC) 20 MG capsule Take 3 capsules (60 mg total) by mouth daily. 90 capsule 2  . glimepiride (AMARYL) 4 MG tablet Take 1 tablet (4 mg total) by mouth 2 (two) times daily. 90 tablet 1  . lidocaine-prilocaine (EMLA) cream Apply 1 application topically as needed. 30 g 12  . meclizine (ANTIVERT) 25 MG tablet Take 1 tablet (25 mg total) by mouth 3 (three) times daily as needed for dizziness. 30 tablet 0  . metFORMIN (GLUCOPHAGE) 850 MG tablet TAKE 1 TABLET BY MOUTH TWICE DAILY 60 tablet 3  . mirtazapine (REMERON) 30 MG tablet Take 1 tablet (30 mg total) by mouth at bedtime. 30 tablet 2  . montelukast (SINGULAIR) 10  MG tablet Take 10 mg by mouth at bedtime.      . Multiple Vitamin (MULTIVITAMIN WITH MINERALS) TABS tablet Take 1 tablet by mouth daily.    . prochlorperazine (COMPAZINE) 10 MG tablet Take 1 tablet (10 mg total) by mouth every 6 (six) hours as needed for nausea or vomiting. 60 tablet 4  . senna-docusate (SENOKOT-S) 8.6-50 MG per tablet Take 2 tablets by mouth daily. 20 tablet 0  . traMADol (ULTRAM) 50 MG tablet Take 1 tablet (50 mg total) by mouth every 8 (eight) hours as needed. 30 tablet 3  . triamterene-hydrochlorothiazide (DYAZIDE) 37.5-25 MG per capsule Take 1 capsule by mouth daily.      No  current facility-administered medications on file prior to visit.    ALLERGIES: Allergies  Allergen Reactions  . Codeine Other (See Comments)    paranoid  . Zocor [Simvastatin - High Dose] Other (See Comments)    Muscle spasms  . Penicillins Rash    FAMILY HISTORY: Family History  Problem Relation Age of Onset  . Cancer Mother   . Alzheimer's disease Father   . Diabetes Brother   . Diabetes Maternal Grandmother   . Cancer Maternal Grandfather     lung  . Suicidality Neg Hx   . Depression Neg Hx   . Dementia Neg Hx   . Anxiety disorder Neg Hx     SOCIAL HISTORY: History   Social History  . Marital Status: Divorced    Spouse Name: N/A  . Number of Children: N/A  . Years of Education: N/A   Occupational History  . Not on file.   Social History Main Topics  . Smoking status: Former Smoker -- 1.00 packs/day for 15 years    Types: Cigarettes    Start date: 09/27/1973    Quit date: 10/15/1988  . Smokeless tobacco: Never Used     Comment: quit smoking 14 years ago  . Alcohol Use: No  . Drug Use: No  . Sexual Activity: No   Other Topics Concern  . Not on file   Social History Narrative    REVIEW OF SYSTEMS: Constitutional: No fevers, chills, or sweat Eyes: No visual changes, double vision, eye pain Ear, nose and throat: No hearing loss, ear pain, nasal congestion, sore throat Cardiovascular: No chest pain, palpitations Respiratory:  No shortness of breath at rest or with exertion, wheezes GastrointestinaI: No nausea, vomiting, diarrhea, abdominal pain, fecal incontinence Genitourinary:  No dysuria, urinary retention or frequency Musculoskeletal:  No neck pain, back pain Integumentary: No rash, pruritus, skin lesions Neurological: as above Psychiatric: depressed Endocrine: Decreased appetite Hematologic/Lymphatic:  No anemia, purpura, petechiae. Allergic/Immunologic: no itchy/runny eyes, nasal congestion, recent allergic reactions, rashes  PHYSICAL  EXAM: Filed Vitals:   09/03/14 1528  BP: 140/78  Pulse: 68  Temp: 98.3 F (36.8 C)  Resp: 18   General: No acute distress Head:  Normocephalic/atraumatic Eyes:  fundi unremarkable, without vessel changes, exudates, hemorrhages or papilledema. Neck: supple, no paraspinal tenderness, full range of motion Back: No paraspinal tenderness Heart: regular rate and rhythm Lungs: Clear to auscultation bilaterally. Vascular: No carotid bruits. Neurological Exam: Mental status: alert and oriented to person, place, and time, recent and remote memory intact, fund of knowledge intact, attention and concentration intact, speech fluent and not dysarthric, language intact. Cranial nerves: CN I: not tested CN II: pupils equal, round and reactive to light, visual fields intact, fundi unremarkable, without vessel changes, exudates, hemorrhages or papilledema. CN III, IV, VI:  full range  of motion, no nystagmus, no ptosis CN V: facial sensation intact CN VII: upper and lower face symmetric CN VIII: hearing intact CN IX, X: gag intact, uvula midline CN XI: sternocleidomastoid and trapezius muscles intact CN XII: tongue midline Bulk & Tone: normal, no fasciculations. Motor:  5/5 throughout Sensation:  Reduced vibration in toes.  Pinprick sensation intact. Deep Tendon Reflexes:  1+ in upper extremities, absent in lower extremities.  Toes downgoing. Finger to nose testing:  No dysmetria Heel to shin:  No dysmetria Gait:  Right limp.  No ataxia.  Able to turn.  Unable to walk in tandem. Romberg negative.  IMPRESSION: Vertigo Posterior circulation stroke Hyperlipidemia Type II diabetes mellitus Morbid obesity  PLAN: Continue ASA 79m daily Optimize blood pressure control Continue optimizing glucose control Discuss pros and cons of statin therapy.  For secondary stroke prevention, LDL goal should be less than 70. MRA of head to evaluate for any focal vertebrobasilar stenosis. 2D  echo Mediterranean diet and weight loss Moderate exercise  Use meclizine sparingly Follow up in 3 months.  Thank you for allowing me to take part in the care of this patient.  [name]  CC:  DEmi Belfast MD

## 2014-09-05 ENCOUNTER — Other Ambulatory Visit (HOSPITAL_COMMUNITY): Payer: Self-pay | Admitting: Psychiatry

## 2014-09-05 ENCOUNTER — Ambulatory Visit (HOSPITAL_COMMUNITY): Payer: Medicare HMO | Attending: Neurology

## 2014-09-08 NOTE — Telephone Encounter (Signed)
Patient last evaluated 05/20/14 and given orders with 2 refills at that time.  Patient canceled appointment 08/21/14.  No new appointment currently scheduled but requesting refills of Prozac and Remeron.

## 2014-09-12 IMAGING — US US ABDOMEN COMPLETE
1 series · 14 of 25 positions shown · non-contrast
Comparison: CT abdomen and pelvis 10/12/2012.

CLINICAL DATA: Left upper quadrant pain. Postprandial nausea.
History of stomach cancer.

EXAM:
ULTRASOUND ABDOMEN COMPLETE

[Series 1: us abdomen complete · 0.35mm/px · 14 of 62 slices shown]
[im 1/62]
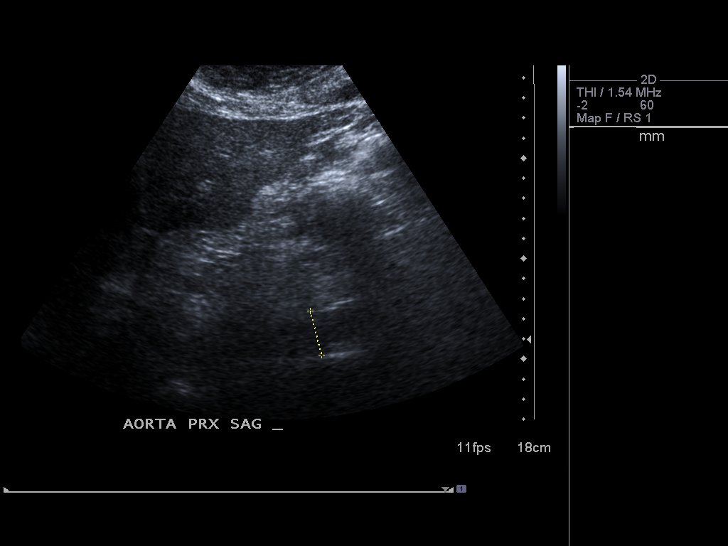
[im 6/62]
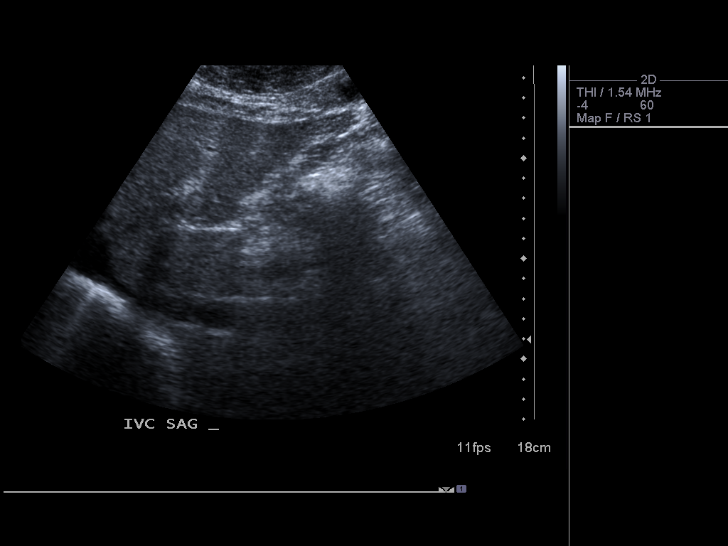
[im 11/62]
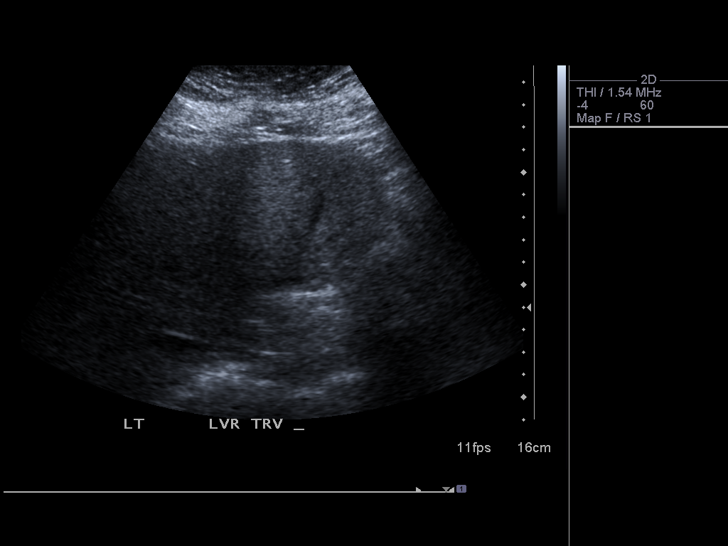
[im 16/62]
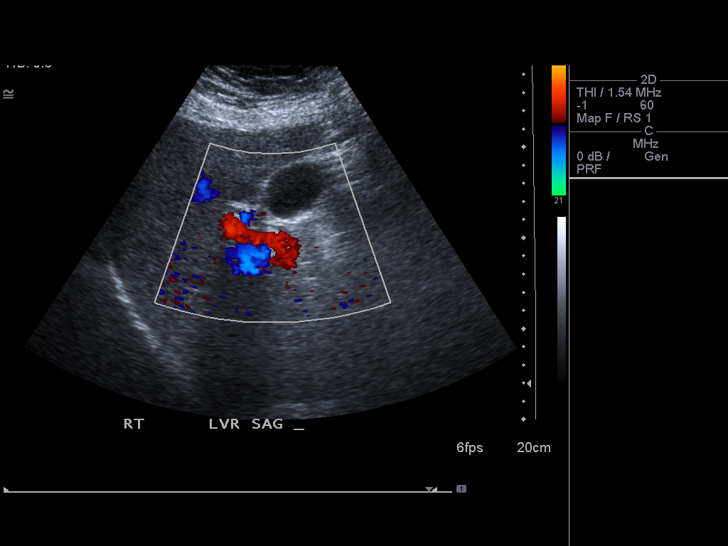
[im 21/62]
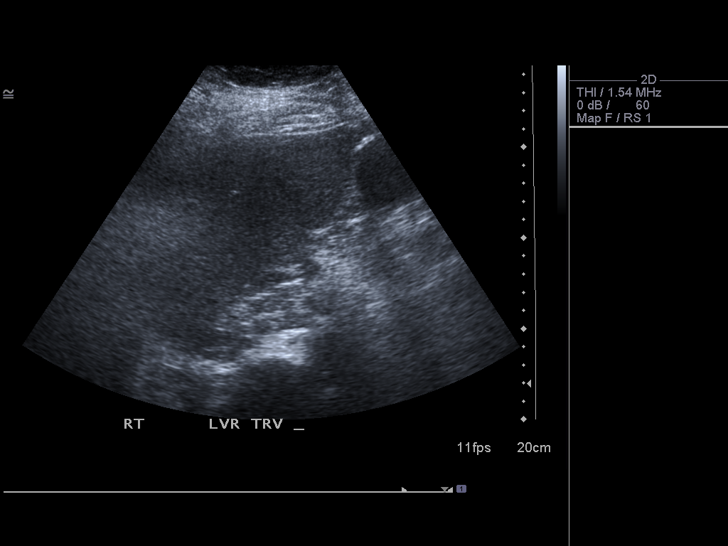
[im 23/62]
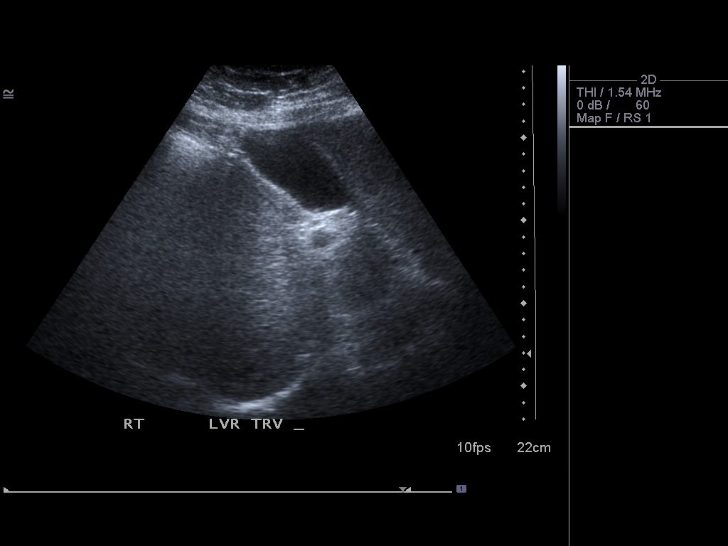
[im 28/62]
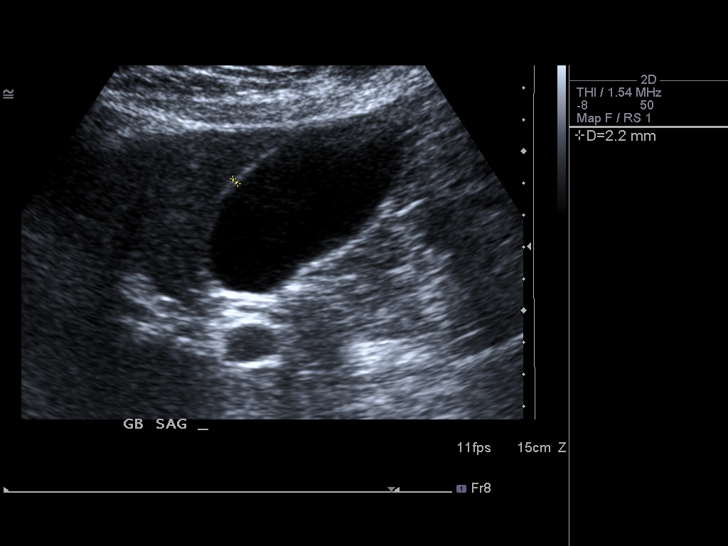
[im 34/62]
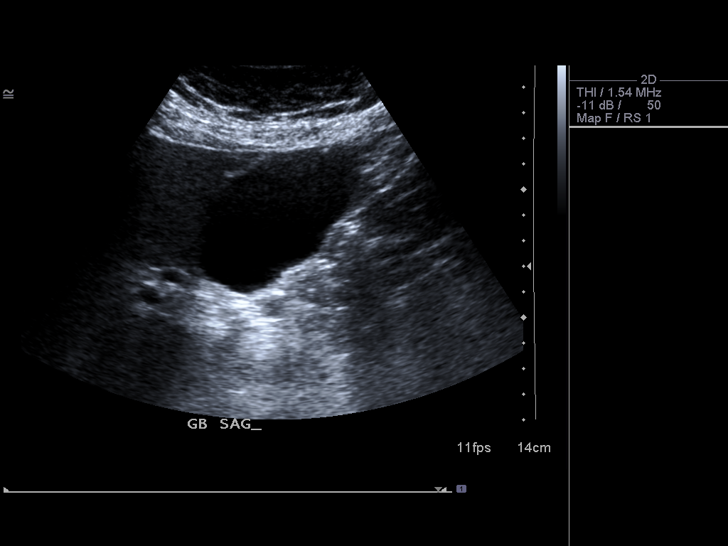
[im 39/62]
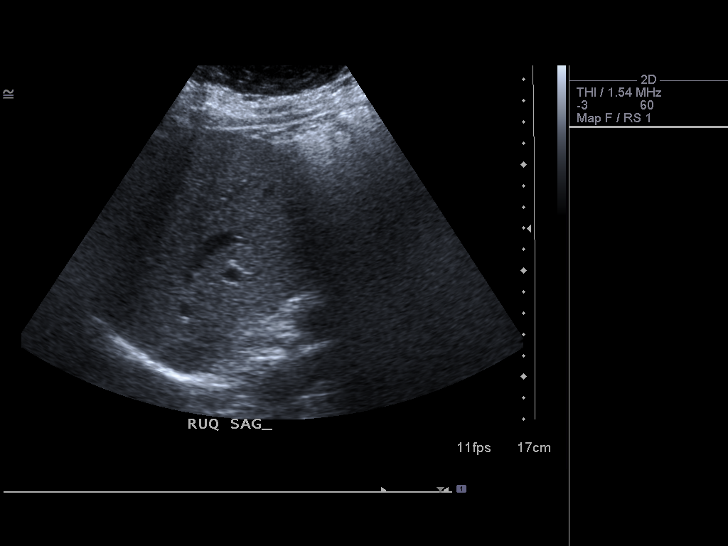
[im 41/62]
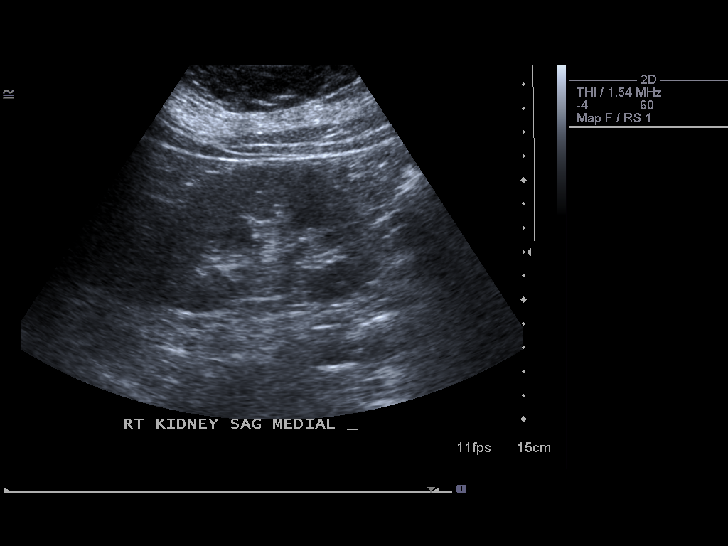
[im 46/62]
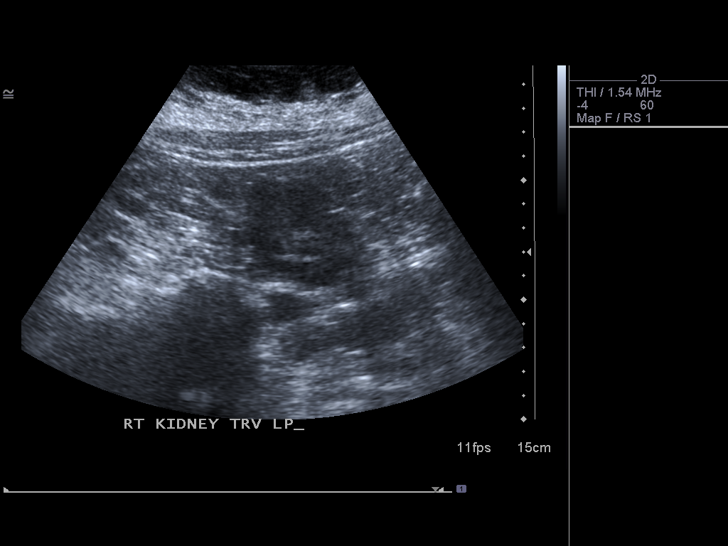
[im 51/62]
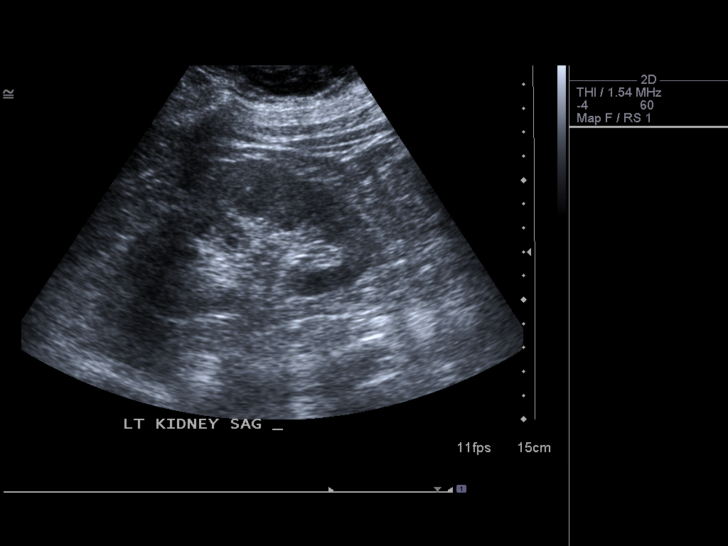
[im 56/62]
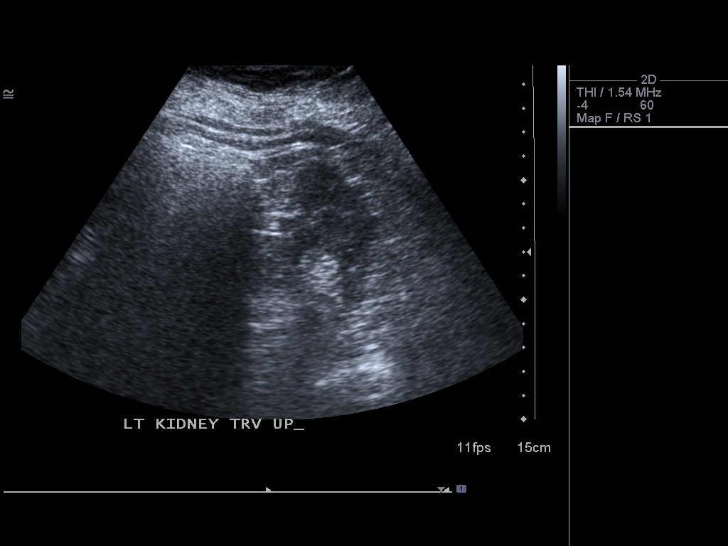
[im 62/62]
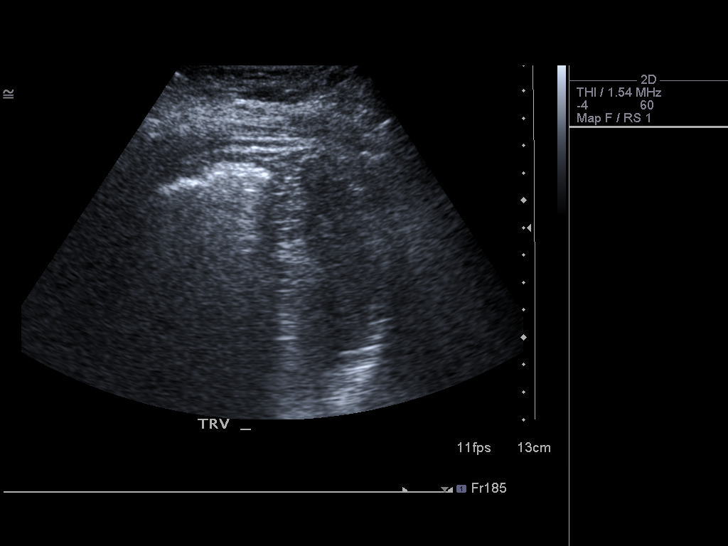

[14 of 25 positions shown; findings below may reference images not displayed]

FINDINGS: Gallbladder

No gallstones or wall thickening visualized. No sonographic Murphy
sign noted.

Common bile duct

Diameter: 4 mm

Liver

No focal lesion identified. Within normal limits in parenchymal
echogenicity.

IVC

No abnormality visualized.

Pancreas

The pancreas is suboptimally visualized secondary to overlying bowel
gas.

Spleen

Size and appearance within normal limits.

Right Kidney

Length: 11.4 cm. Echogenicity within normal limits. No mass or
hydronephrosis visualized.

Left Kidney

Length: 10.6 cm. Echogenicity within normal limits. No mass or
hydronephrosis visualized.

Abdominal aorta

No aneurysm visualized.
IMPRESSION: No cholelithiasis or sonographic evidence of acute cholecystitis.

## 2014-09-18 ENCOUNTER — Other Ambulatory Visit: Payer: Self-pay | Admitting: Internal Medicine

## 2014-09-18 ENCOUNTER — Encounter: Payer: Self-pay | Admitting: Internal Medicine

## 2014-09-18 ENCOUNTER — Ambulatory Visit (INDEPENDENT_AMBULATORY_CARE_PROVIDER_SITE_OTHER): Payer: Medicare HMO | Admitting: Internal Medicine

## 2014-09-18 VITALS — BP 113/76 | HR 78 | Resp 16 | Ht 67.0 in | Wt 257.0 lb

## 2014-09-18 DIAGNOSIS — F331 Major depressive disorder, recurrent, moderate: Secondary | ICD-10-CM

## 2014-09-18 DIAGNOSIS — C169 Malignant neoplasm of stomach, unspecified: Secondary | ICD-10-CM

## 2014-09-18 DIAGNOSIS — E041 Nontoxic single thyroid nodule: Secondary | ICD-10-CM

## 2014-09-18 DIAGNOSIS — I10 Essential (primary) hypertension: Secondary | ICD-10-CM

## 2014-09-18 DIAGNOSIS — Z0000 Encounter for general adult medical examination without abnormal findings: Secondary | ICD-10-CM

## 2014-09-18 LAB — POCT URINALYSIS DIPSTICK
Bilirubin, UA: NEGATIVE
Glucose, UA: NEGATIVE
Ketones, UA: NEGATIVE
Leukocytes, UA: NEGATIVE
NITRITE UA: NEGATIVE
PH UA: 6.5
Protein, UA: NEGATIVE
RBC UA: NEGATIVE
Spec Grav, UA: 1.01
Urobilinogen, UA: NEGATIVE

## 2014-09-18 MED ORDER — TRAMADOL HCL 50 MG PO TABS: 50.0000 mg | ORAL_TABLET | Freq: Three times a day (TID) | ORAL | Status: DC | PRN

## 2014-09-18 MED ORDER — MONTELUKAST SODIUM 10 MG PO TABS: 10.0000 mg | ORAL_TABLET | Freq: Every day | ORAL | Status: DC

## 2014-09-18 NOTE — Progress Notes (Signed)
Subjective:    Patient ID: [name], female    DOB: 07-19-1951, 64 y.o.   MRN: [id number]  HPI  08/2014 neurology note IMPRESSION: Vertigo Posterior circulation stroke Hyperlipidemia Type II diabetes mellitus Morbid obesity  PLAN: Continue ASA 38m daily Optimize blood pressure control Continue optimizing glucose control Discuss pros and cons of statin therapy. For secondary stroke prevention, LDL goal should be less than 70. MRA of head to evaluate for any focal vertebrobasilar stenosis. 2D echo Mediterranean diet and weight loss Moderate exercise  Use meclizine sparingly Follow up in 3 months.  Thank you for allowing me to take part in the care of this patient.  [name]  07/31/2014 my note Assessment & Plan:  Dizziness /abnormal MRI with question of subacute pontine infarct. Will get follow up MRI Advised to take baby ASA one daily . Further management based on results . Will also refer to neurology   Subjective vertigo Will give antivert.  DM managed by endocrinology  Stomach CA   MDD   Addendum 1/20 Follow up MRI suggestive of prior infarct . Will get carotid dopplers and 2D echo prior to neurology visit          Revision History        04/2014 [facility] Plan of Care: Medication management with supportive therapy. Risks/benefits and SE of the medication discussed. Pt verbalized understanding and verbal consent obtained for treatment. Affirm with the patient that the medications are taken as ordered. Patient expressed understanding of how their medications were to be used.  - mild improvement of symptoms but still depressed    Laboratory: reviewed 04/28/2014 labs with pt Creat 1.3, gluc 165; CBC WNL,   Psychotherapy: Therapy: brief supportive therapy provided. Discussed psychosocial stressors in detail.   Medications: Prozac to 669mqD for depression  Increase Remeron to 3046mo qHS for insomnia, appetite and depression.  Pt is aware of SE of increased appetite and will monitor food intake due to her DM.    Routine PRN Medications: No   Consultations: pt declined therapy at this point in time   Safety Concerns: Pt denies SI and is at an acute low risk for suicide.Patient told to call clinic if any problems occur. Patient advised to go to ER if they should develop SI/HI, side effects, or if symptoms worsen. Has crisis numbers to call if needed. Pt verbalized understanding.   Other: F/up in 3 months or sooner if needed           TODAY  [name] here for CPE  HM:  She declines vaccine due to finances see scanned sheet pap done by GYN  DM  Managed by endocrinology  Posterior circulation stroke  Undergoing assessment by neurology  She is on 81 mg ASA  Hyperlipidemia see below   Incidental thyroid nodules found on MRI:  Pt had U/S done in 2011 by former endocrinologist.  She does not recall this or if any furhter management was done   Gastric CA  MDD  [facility] Pro[facility]ac [facility] remeron [facility] mood has been good  No S/H ideation    Review of Systems  Respiratory: Negative for cough, chest tightness, shortness of breath and wheezing.   Cardiovascular: Negative for chest pain, palpitations and leg swelling.       Objective:   Physical Exam Physical Exam  Nursing note and vitals reviewed.  Constitutional: She is oriented to person, place, and time. She appears well-developed and well-nourished.  HENT:  Head: Normocephalic and atraumatic.  Right Ear: Tympanic membrane and ear canal normal. No drainage. Tympanic membrane is not injected and not erythematous.  Left Ear: Tympanic membrane and ear canal normal. No drainage. Tympanic membrane is not injected and not erythematous.  Nose: Nose normal. Right sinus exhibits no maxillary sinus tenderness and no frontal sinus tenderness. Left sinus exhibits no maxillary sinus tenderness and no frontal sinus tenderness.  Mouth/Throat: Oropharynx is clear and  moist. No oral lesions. No oropharyngeal exudate.  Eyes: Conjunctivae and EOM are normal. Pupils are equal, round, and reactive to light.  Neck: Normal range of motion. Neck supple. No JVD present. Carotid bruit is not present. No mass and no thyromegaly present.  Cardiovascular: Normal rate, regular rhythm, S1 normal, S2 normal and intact distal pulses. Exam reveals no gallop and no friction rub.  No murmur heard.  Pulses:  Carotid pulses are 2+ on the right side, and 2+ on the left side.  Dorsalis pedis pulses are 2+ on the right side, and 2+ on the left side.  No carotid bruit. No LE edema  Pulmonary/Chest: Breath sounds normal. She has no wheezes. She has no rales. She exhibits no tenderness.  Abdominal: Soft. Bowel sounds are normal. She exhibits no distension and no mass. There is no hepatosplenomegaly. There is no tenderness. There is no CVA tenderness.  Musculoskeletal: Normal range of motion.  No active synovitis to joints.  Lymphadenopathy:  She has no cervical adenopathy.  She has no axillary adenopathy.  Right: No inguinal and no supraclavicular adenopathy present.  Left: No inguinal and no supraclavicular adenopathy present.  Neurological: She is alert and oriented to person, place, and time. She has normal strength and normal reflexes. She displays no tremor. No cranial nerve deficit or sensory deficit. Coordination and gait normal.  Skin: Skin is warm and dry. No rash noted. No cyanosis. Nails show no clubbing.  Psychiatric: She has a normal mood and affect. Her speech is normal and behavior is normal. Cognition and memory are normal.          Assessment & Plan:  HM declines vaccine wishes to check with insurance as she is is very concerened about finances now   < 15 pack year smoker,  Pap per gyn,  UTD with colonoscopy declines DEXA  HTN :  Continue meds  Asthma  Stable continue meds  Thyroid nodules pt cannot recall management of prior nodules,  Will get thyroid U/S   2011 US done by former endocrinologist swx  Posterior circulation CVA  Undergoing assessment by neurology  Continue ASA  DM managed by endocrine  Hyperlipidemia  Will defer to endocrine opinion regarding statin as she is concerned about finances now and does not wish a new med at this time   Gastric CA manage by oncology   MDD  See above

## 2014-09-19 ENCOUNTER — Encounter: Payer: Self-pay | Admitting: *Deleted

## 2014-09-20 ENCOUNTER — Ambulatory Visit (HOSPITAL_BASED_OUTPATIENT_CLINIC_OR_DEPARTMENT_OTHER): Payer: Medicare HMO

## 2014-09-21 ENCOUNTER — Other Ambulatory Visit (HOSPITAL_COMMUNITY): Payer: Self-pay | Admitting: Psychiatry

## 2014-09-23 NOTE — Telephone Encounter (Signed)
Pt last seen in Oct. She needs to schedule a follow up appt get any refills

## 2014-09-24 ENCOUNTER — Telehealth (HOSPITAL_COMMUNITY): Payer: Self-pay | Admitting: Unknown Physician Specialty

## 2014-09-24 ENCOUNTER — Ambulatory Visit (HOSPITAL_COMMUNITY): Admission: RE | Admit: 2014-09-24 | Payer: Medicare HMO | Source: Ambulatory Visit

## 2014-09-25 ENCOUNTER — Other Ambulatory Visit (HOSPITAL_BASED_OUTPATIENT_CLINIC_OR_DEPARTMENT_OTHER): Payer: Medicare HMO | Admitting: Lab

## 2014-09-25 ENCOUNTER — Ambulatory Visit (HOSPITAL_BASED_OUTPATIENT_CLINIC_OR_DEPARTMENT_OTHER): Payer: Medicare HMO | Admitting: Hematology & Oncology

## 2014-09-25 ENCOUNTER — Ambulatory Visit (HOSPITAL_BASED_OUTPATIENT_CLINIC_OR_DEPARTMENT_OTHER): Payer: Medicare HMO

## 2014-09-25 ENCOUNTER — Other Ambulatory Visit: Payer: Self-pay | Admitting: *Deleted

## 2014-09-25 ENCOUNTER — Encounter: Payer: Self-pay | Admitting: Hematology & Oncology

## 2014-09-25 VITALS — BP 107/69 | HR 74 | Temp 98.4°F | Resp 16 | Ht 67.0 in | Wt 258.0 lb

## 2014-09-25 DIAGNOSIS — C169 Malignant neoplasm of stomach, unspecified: Secondary | ICD-10-CM

## 2014-09-25 DIAGNOSIS — M62830 Muscle spasm of back: Secondary | ICD-10-CM

## 2014-09-25 DIAGNOSIS — Z85028 Personal history of other malignant neoplasm of stomach: Secondary | ICD-10-CM

## 2014-09-25 DIAGNOSIS — E119 Type 2 diabetes mellitus without complications: Secondary | ICD-10-CM

## 2014-09-25 LAB — CMP (CANCER CENTER ONLY)
ALBUMIN: 3.4 g/dL (ref 3.3–5.5)
ALT(SGPT): 16 U/L (ref 10–47)
AST: 20 U/L (ref 11–38)
Alkaline Phosphatase: 82 U/L (ref 26–84)
BUN, Bld: 15 mg/dL (ref 7–22)
CALCIUM: 9.5 mg/dL (ref 8.0–10.3)
CO2: 30 meq/L (ref 18–33)
CREATININE: 1.1 mg/dL (ref 0.6–1.2)
Chloride: 102 mEq/L (ref 98–108)
Glucose, Bld: 157 mg/dL — ABNORMAL HIGH (ref 73–118)
Potassium: 5.1 mEq/L — ABNORMAL HIGH (ref 3.3–4.7)
SODIUM: 146 meq/L — AB (ref 128–145)
Total Bilirubin: 0.5 mg/dl (ref 0.20–1.60)
Total Protein: 7.8 g/dL (ref 6.4–8.1)

## 2014-09-25 LAB — CBC WITH DIFFERENTIAL (CANCER CENTER ONLY)
BASO#: 0 10*3/uL (ref 0.0–0.2)
BASO%: 0.4 % (ref 0.0–2.0)
EOS%: 5.7 % (ref 0.0–7.0)
Eosinophils Absolute: 0.4 10*3/uL (ref 0.0–0.5)
HEMATOCRIT: 37 % (ref 34.8–46.6)
HGB: 12.2 g/dL (ref 11.6–15.9)
LYMPH#: 2.6 10*3/uL (ref 0.9–3.3)
LYMPH%: 38 % (ref 14.0–48.0)
MCH: 29.1 pg (ref 26.0–34.0)
MCHC: 33 g/dL (ref 32.0–36.0)
MCV: 88 fL (ref 81–101)
MONO#: 0.6 10*3/uL (ref 0.1–0.9)
MONO%: 9.2 % (ref 0.0–13.0)
NEUT#: 3.2 10*3/uL (ref 1.5–6.5)
NEUT%: 46.7 % (ref 39.6–80.0)
Platelets: 342 10*3/uL (ref 145–400)
RBC: 4.19 10*6/uL (ref 3.70–5.32)
RDW: 14.5 % (ref 11.1–15.7)
WBC: 6.9 10*3/uL (ref 3.9–10.0)

## 2014-09-25 LAB — IRON AND TIBC CHCC
%SAT: 25 % (ref 21–57)
Iron: 62 ug/dL (ref 41–142)
TIBC: 249 ug/dL (ref 236–444)
UIBC: 187 ug/dL (ref 120–384)

## 2014-09-25 LAB — FERRITIN CHCC: FERRITIN: 306 ng/mL — AB (ref 9–269)

## 2014-09-25 MED ORDER — HEPARIN SOD (PORK) LOCK FLUSH 100 UNIT/ML IV SOLN
500.0000 [IU] | Freq: Once | INTRAVENOUS | Status: AC
  Administered 2014-09-25: 500 [IU] via INTRAVENOUS
  Filled 2014-09-25: qty 5

## 2014-09-25 MED ORDER — METAXALONE 800 MG PO TABS
800.0000 mg | ORAL_TABLET | Freq: Three times a day (TID) | ORAL | Status: DC
Start: 2014-09-25 — End: 2014-09-25

## 2014-09-25 MED ORDER — TIZANIDINE HCL 4 MG PO TABS: 4.0000 mg | ORAL_TABLET | Freq: Four times a day (QID) | ORAL | Status: DC | PRN

## 2014-09-25 MED ORDER — SODIUM CHLORIDE 0.9 % IJ SOLN
10.0000 mL | INTRAMUSCULAR | Status: DC | PRN
  Administered 2014-09-25: 10 mL via INTRAVENOUS
  Filled 2014-09-25: qty 10

## 2014-09-25 NOTE — Progress Notes (Signed)
Hematology and Oncology Follow Up Visit  [name] [id number] [birth year] 64 y.o. 09/25/2014   Principle Diagnosis:   Stage IB (T1, N1, M0) adenocarcinoma of the stomach Non-insulin-dependent diabetes   Current Therapy:   Observation     Interim History:  Ms.  Jefferson is doing much better. She is much more happy. She now is seeing an internal medicine doctor. This, I think really has helped with her medical problems. She's not having any problems with abdominal pain. There's no nausea or vomiting.   She is complaining of some back spasms. This is in the lower back. I think her weight probably somebody with this. I went ahead and gave her prescription for Zanaflex (4 mg by mouth every 6 hours when necessary).   Her blood sugars have been doing pretty well. She is trying to watch what she eats.  She's had no change in bowel or bladder habits. She has had no bleeding. She has had no leg swelling.  There's been no cough. She's had no shortness of breath.  There's been no rashes.   Medications:  Current outpatient prescriptions:  .  albuterol (PROVENTIL HFA;VENTOLIN HFA) 108 (90 BASE) MCG/ACT inhaler, Inhale 2 puffs into lungs every 8 hours prn wheezing, Disp: 1 Inhaler, Rfl: 0 .  carvedilol (COREG) 12.5 MG tablet, Take 12.5 mg by mouth every morning., Disp: , Rfl:  .  FLUoxetine (PROZAC) 20 MG capsule, TAKE 3 CAPSULES (60 MG TOTAL) BY MOUTH DAILY., Disp: 90 capsule, Rfl: 0 .  glimepiride (AMARYL) 4 MG tablet, TAKE 1 TABLET (4 MG TOTAL) BY MOUTH 2 (TWO) TIMES DAILY., Disp: 90 tablet, Rfl: 1 .  lidocaine-prilocaine (EMLA) cream, Apply 1 application topically as needed., Disp: 30 g, Rfl: 12 .  meclizine (ANTIVERT) 25 MG tablet, Take 1 tablet (25 mg total) by mouth 3 (three) times daily as needed for dizziness., Disp: 30 tablet, Rfl: 0 .  metFORMIN (GLUCOPHAGE) 850 MG tablet, TAKE 1 TABLET BY MOUTH TWICE DAILY, Disp: 60 tablet, Rfl: 3 .  mirtazapine (REMERON) 30 MG tablet, TAKE 1 TABLET (30  MG TOTAL) BY MOUTH AT BEDTIME., Disp: 30 tablet, Rfl: 0 .  montelukast (SINGULAIR) 10 MG tablet, Take 1 tablet (10 mg total) by mouth at bedtime., Disp: 90 tablet, Rfl: 1 .  Multiple Vitamin (MULTIVITAMIN WITH MINERALS) TABS tablet, Take 1 tablet by mouth daily., Disp: , Rfl:  .  prochlorperazine (COMPAZINE) 10 MG tablet, Take 1 tablet (10 mg total) by mouth every 6 (six) hours as needed for nausea or vomiting., Disp: 60 tablet, Rfl: 4 .  senna-docusate (SENOKOT-S) 8.6-50 MG per tablet, Take 2 tablets by mouth daily., Disp: 20 tablet, Rfl: 0 .  traMADol (ULTRAM) 50 MG tablet, Take 1 tablet (50 mg total) by mouth every 8 (eight) hours as needed., Disp: 30 tablet, Rfl: 3 .  triamterene-hydrochlorothiazide (DYAZIDE) 37.5-25 MG per capsule, Take 1 capsule by mouth daily. , Disp: , Rfl:  .  tiZANidine (ZANAFLEX) 4 MG tablet, Take 1 tablet (4 mg total) by mouth every 6 (six) hours as needed for muscle spasms., Disp: 90 tablet, Rfl: 2 No current facility-administered medications for this visit.  Facility-Administered Medications Ordered in Other Visits:  .  sodium chloride 0.9 % injection 10 mL, 10 mL, Intravenous, PRN, Jessica Napoleon, MD, 10 mL at 09/25/14 1050  Allergies:  Allergies  Allergen Reactions  . Codeine Other (See Comments)    paranoid  . Zocor [Simvastatin - High Dose] Other (See Comments)    Muscle  spasms  . Penicillins Rash    Past Medical History, Surgical history, Social history, and Family History were reviewed and updated.  Review of Systems: As above  Physical Exam:  height is 5' 7"  (1.702 m) and weight is 258 lb (117.028 kg). Her oral temperature is 98.4 F (36.9 C). Her blood pressure is 107/69 and her pulse is 74. Her respiration is 16.   Obese African American female. Head and neck exam shows no ocular or oral lesions. She has no palpable cervical or supraclavicular lymph nodes. Lungs are clear. Cardiac exam regular rate and rhythm with no murmurs, rubs or bruits.  Abdomen is soft. She is moderately obese. She has a well-healed laparotomy scar. There is no fluid wave. There is no palpable liver or spleen tip. Back exam shows no tenderness over the spine ribs or hips. Extremities shows no clubbing cyanosis or edema. Skin exam no rashes. Neurological exam is nonfocal.  Lab Results  Component Value Date   WBC 6.9 09/25/2014   HGB 12.2 09/25/2014   HCT 37.0 09/25/2014   MCV 88 09/25/2014   PLT 342 09/25/2014     Chemistry      Component Value Date/Time   NA 146* 09/25/2014 0933   NA 141 07/31/2014 1334   NA 141 11/14/2013 0804   K 5.1* 09/25/2014 0933   K 4.3 07/31/2014 1334   K 4.6 11/14/2013 0804   CL 102 09/25/2014 0933   CL 103 07/31/2014 1334   CO2 30 09/25/2014 0933   CO2 20 07/31/2014 1334   CO2 24 11/14/2013 0804   BUN 15 09/25/2014 0933   BUN 12 07/31/2014 1334   BUN 17.8 11/14/2013 0804   CREATININE 1.1 09/25/2014 0933   CREATININE 1.34* 12/27/2013 1440   CREATININE 1.7* 11/14/2013 0804      Component Value Date/Time   CALCIUM 9.5 09/25/2014 0933   CALCIUM 9.7 07/31/2014 1334   CALCIUM 10.4 11/14/2013 0804   ALKPHOS 82 09/25/2014 0933   ALKPHOS 81 07/31/2014 1334   ALKPHOS 75 11/14/2013 0804   AST 20 09/25/2014 0933   AST 15 07/31/2014 1334   AST 16 11/14/2013 0804   ALT 16 09/25/2014 0933   ALT 12 07/31/2014 1334   ALT 14 11/14/2013 0804   BILITOT 0.50 09/25/2014 0933   BILITOT 0.4 07/31/2014 1334   BILITOT 0.38 11/14/2013 0804     .   Impression and Plan: [name] is 64 year old African [place] female with stage IB stomach cancer. She underwent resection. She then had adjuvant radiation and chemotherapy. She completed this about 7 years ago.  I am glad that she is doing so well. She just looks better. I think it will help immensely that she now has a family doctor that she can see for all of her other health issues.  We will see what her iron studies show. We do give her IV iron on occasion.  We will flush her  Port-A-Cath.  We will have her back in 4 months for another visit and flush. Jessica Napoleon, MD 3/3/20161:29 PM

## 2014-09-25 NOTE — Patient Instructions (Signed)

## 2014-09-27 ENCOUNTER — Ambulatory Visit (HOSPITAL_BASED_OUTPATIENT_CLINIC_OR_DEPARTMENT_OTHER)
Admission: RE | Admit: 2014-09-27 | Discharge: 2014-09-27 | Disposition: A | Discharge status: Home / Self Care | Payer: Medicare HMO | Source: Ambulatory Visit | Attending: Internal Medicine | Admitting: Internal Medicine

## 2014-09-27 DIAGNOSIS — E042 Nontoxic multinodular goiter: Secondary | ICD-10-CM | POA: Diagnosis present

## 2014-09-27 DIAGNOSIS — E041 Nontoxic single thyroid nodule: Secondary | ICD-10-CM

## 2014-10-01 ENCOUNTER — Telehealth: Payer: Self-pay | Admitting: Internal Medicine

## 2014-10-01 DIAGNOSIS — E049 Nontoxic goiter, unspecified: Secondary | ICD-10-CM

## 2014-10-01 NOTE — Telephone Encounter (Signed)
Appt set for 10/16/14 at 1415 - pt notified

## 2014-10-01 NOTE — Telephone Encounter (Signed)
Spoke with pt and Dr. Cruzita Lederer regarding thyroid ultasound results   Counseled pt that Dr. Cruzita Lederer would like to see her in her office to review ultrasound   Pt is in agreement  Will schedule with Dr. Cruzita Lederer

## 2014-10-06 ENCOUNTER — Ambulatory Visit (HOSPITAL_BASED_OUTPATIENT_CLINIC_OR_DEPARTMENT_OTHER): Payer: Medicare HMO

## 2014-10-08 ENCOUNTER — Ambulatory Visit (HOSPITAL_BASED_OUTPATIENT_CLINIC_OR_DEPARTMENT_OTHER)
Admission: RE | Admit: 2014-10-08 | Discharge: 2014-10-08 | Disposition: A | Discharge status: Home / Self Care | Payer: Medicare HMO | Source: Ambulatory Visit | Attending: Neurology | Admitting: Neurology

## 2014-10-08 DIAGNOSIS — I639 Cerebral infarction, unspecified: Secondary | ICD-10-CM

## 2014-10-08 DIAGNOSIS — E1110 Type 2 diabetes mellitus with ketoacidosis without coma: Secondary | ICD-10-CM

## 2014-10-08 DIAGNOSIS — Z87891 Personal history of nicotine dependence: Secondary | ICD-10-CM | POA: Insufficient documentation

## 2014-10-08 DIAGNOSIS — I10 Essential (primary) hypertension: Secondary | ICD-10-CM | POA: Diagnosis not present

## 2014-10-08 DIAGNOSIS — G459 Transient cerebral ischemic attack, unspecified: Secondary | ICD-10-CM | POA: Insufficient documentation

## 2014-10-08 DIAGNOSIS — E119 Type 2 diabetes mellitus without complications: Secondary | ICD-10-CM | POA: Diagnosis not present

## 2014-10-08 DIAGNOSIS — E785 Hyperlipidemia, unspecified: Secondary | ICD-10-CM | POA: Diagnosis not present

## 2014-10-08 NOTE — Progress Notes (Signed)
  Echocardiogram 2D Echocardiogram has been performed.  [name] 10/08/2014, 9:55 AM

## 2014-10-09 ENCOUNTER — Telehealth: Payer: Self-pay | Admitting: *Deleted

## 2014-10-09 NOTE — Telephone Encounter (Signed)
-----   Message from Pieter Partridge, DO sent at 10/08/2014  4:39 PM EDT ----- Echo is okay ----- Message -----    From: Lab in [facility] Interface    Sent: 10/08/2014   2:32 PM      To: Pieter Partridge, DO

## 2014-10-09 NOTE — Telephone Encounter (Signed)
Patient is aware of  Echo results

## 2014-10-11 ENCOUNTER — Ambulatory Visit (HOSPITAL_BASED_OUTPATIENT_CLINIC_OR_DEPARTMENT_OTHER)
Admission: RE | Admit: 2014-10-11 | Discharge: 2014-10-11 | Disposition: A | Discharge status: Home / Self Care | Payer: Medicare HMO | Source: Ambulatory Visit | Attending: Neurology | Admitting: Neurology

## 2014-10-11 DIAGNOSIS — R42 Dizziness and giddiness: Secondary | ICD-10-CM | POA: Insufficient documentation

## 2014-10-11 DIAGNOSIS — E785 Hyperlipidemia, unspecified: Secondary | ICD-10-CM | POA: Diagnosis not present

## 2014-10-11 DIAGNOSIS — I639 Cerebral infarction, unspecified: Secondary | ICD-10-CM

## 2014-10-11 DIAGNOSIS — E1310 Other specified diabetes mellitus with ketoacidosis without coma: Secondary | ICD-10-CM | POA: Diagnosis not present

## 2014-10-11 DIAGNOSIS — Z85028 Personal history of other malignant neoplasm of stomach: Secondary | ICD-10-CM | POA: Diagnosis not present

## 2014-10-11 DIAGNOSIS — E1110 Type 2 diabetes mellitus with ketoacidosis without coma: Secondary | ICD-10-CM

## 2014-10-13 ENCOUNTER — Telehealth: Payer: Self-pay | Admitting: *Deleted

## 2014-10-13 NOTE — Telephone Encounter (Signed)
Patient is aware of normal MRA of head

## 2014-10-13 NOTE — Telephone Encounter (Signed)
-----   Message from Pieter Partridge, DO sent at 10/13/2014  7:03 AM EDT ----- MRA of head is okay. ----- Message -----    From: Rad Results In Interface    Sent: 10/11/2014   4:39 PM      To: Pieter Partridge, DO

## 2014-10-16 ENCOUNTER — Encounter: Payer: Self-pay | Admitting: Internal Medicine

## 2014-10-16 ENCOUNTER — Ambulatory Visit (INDEPENDENT_AMBULATORY_CARE_PROVIDER_SITE_OTHER): Payer: Medicare HMO | Admitting: Internal Medicine

## 2014-10-16 VITALS — BP 112/62 | HR 96 | Temp 98.6°F | Resp 14 | Wt 257.0 lb

## 2014-10-16 DIAGNOSIS — E042 Nontoxic multinodular goiter: Secondary | ICD-10-CM | POA: Diagnosis not present

## 2014-10-16 MED ORDER — METFORMIN HCL 850 MG PO TABS: 850.0000 mg | ORAL_TABLET | Freq: Two times a day (BID) | ORAL | Status: DC

## 2014-10-16 NOTE — Patient Instructions (Signed)
Please let me know what you decided regarding the thyroid nodules.  I would advise you to go ahead with the 3 thyroid biopsies as discussed, but we can also follow the nodules by ultrasound or even, last resort, refer you to surgery for thyroid removal.    Thyroid Biopsy The thyroid gland is a butterfly-shaped gland situated in the front of the neck. It produces hormones which affect metabolism, growth and development, and body temperature. A thyroid biopsy is a procedure in which small samples of tissue or fluid are removed from the thyroid gland or mass and examined under a microscope. This test is done to determine the cause of thyroid problems, such as infection, cancer, or other thyroid problems. There are 2 ways to obtain samples: 1. Fine needle biopsy. Samples are removed using a thin needle inserted through the skin and into the thyroid gland or mass. 2. Open biopsy. Samples are removed after a cut (incision) is made through the skin. LET YOUR CAREGIVER KNOW ABOUT:   Allergies.  Medications taken including herbs, eye drops, over-the-counter medications, and creams.  Use of steroids (by mouth or creams).  Previous problems with anesthetics or numbing medicine.  Possibility of pregnancy, if this applies.  History of blood clots (thrombophlebitis).  History of bleeding or blood problems.  Previous surgery.  Other health problems. RISKS AND COMPLICATIONS  Bleeding from the site. The risk of bleeding is higher if you have a bleeding disorder or are taking any blood thinning medications (anticoagulants).  Infection.  Injury to structures near the thyroid gland. BEFORE THE PROCEDURE  This is a procedure that can be done as an outpatient. Confirm the time that you need to arrive for your procedure. Confirm whether there is a need to fast or withhold any medications. A blood sample may be done to determine your blood clotting time. Medicine may be given to help you relax  (sedative). PROCEDURE Fine needle biopsy. You will be awake during the procedure. You may be asked to lie on your back with your head tipped backward to extend your neck. Let your caregiver know if you cannot tolerate the positioning. An area on your neck will be cleansed. A needle is inserted through the skin of your neck. You may feel a mild discomfort during this procedure. You may be asked to avoid coughing, talking, swallowing, or making sounds during some portions of the procedure. The needle is withdrawn once tissue or fluid samples have been removed. Pressure may be applied to the neck to reduce swelling and ensure that bleeding has stopped. The samples will be sent for examination.  Open biopsy. You will be given general anesthesia. You will be asleep during the procedure. An incision is made in your neck. A sample of thyroid tissue or the mass is removed. The tissue sample or mass will be sent for examination. The sample or mass may be examined during the biopsy. If the sample or mass contains cancer cells, some or all of the thyroid gland may be removed. The incision is closed with stitches. AFTER THE PROCEDURE  Your recovery will be assessed and monitored. If there are no problems, as an outpatient, you should be able to go home shortly after the procedure. If you had a fine needle biopsy:  You may have soreness at the biopsy site for 1 to 2 days. If you had an open biopsy:   You may have soreness at the biopsy site for 3 to 4 days.  You may have a  hoarse voice or sore throat for 1 to 2 days. Obtaining the Test Results It is your responsibility to obtain your test results. Do not assume everything is normal if you have not heard from your caregiver or the medical facility. It is important for you to follow up on all of your test results. HOME CARE INSTRUCTIONS   Keeping your head raised on a pillow when you are lying down may ease biopsy site discomfort.  Supporting the back of your  head and neck with both hands as you sit up from a lying position may ease biopsy site discomfort.  Only take over-the-counter or prescription medicines for pain, discomfort, or fever as directed by your caregiver.  Throat lozenges or gargling with warm salt water may help to soothe a sore throat. SEEK IMMEDIATE MEDICAL CARE IF:   You have severe bleeding from the biopsy site.  You have difficulty swallowing.  You have a fever.  You have increased pain, swelling, redness, or warmth at the biopsy site.  You notice pus coming from the biopsy site.  You have swollen glands (lymph nodes) in your neck. Document Released: 05/08/2007 Document Revised: 11/05/2012 Document Reviewed: 10/03/2013 [facility] Patient Information 2015 [place], [place]. This information is not intended to replace advice given to you by your health care provider. Make sure you discuss any questions you have with your health care provider.

## 2014-10-16 NOTE — Progress Notes (Signed)
Patient ID: [name], female   DOB: 07-20-1951, 64 y.o.   MRN: [id number]   HPI  [name] is a 64 y.o.-year-old female, referred by her PCP, Dr. Ralene Bathe, for evaluation and management of MNG.   Pt has had a h/o MNG and had RAI tx for an overactive thyroid nodule in 2011.  Latest Thyroid U/S (09/28/2014): 6 larger nodules, 2 of them of 3 cm   I reviewed pt's thyroid tests: Lab Results  Component Value Date   TSH 2.435 07/31/2014   TSH 1.348 10/12/2012   TSH 2.559 03/11/2011   TSH 3.155 01/05/2011   TSH 1.823 09/24/2010   TSH 1.925 06/25/2010   11/24/2009: 1. Normal (25%) 24 hour radioiodine uptake by the thyroid gland. 2. Autonomous nodule superiorly in the right lobe with relative suppression of activity elsewhere in the gland. There is a possible cold nodule in the mid right lobe, corresponding with the dominant nodule seen on prior CT.  01/11/2010: RAI Tx for the R autonomous thyroid nodule  Pt denies feeling nodules in neck, hoarseness, dysphagia/odynophagia, SOB with lying down.  Pt denies: - heat intolerance/cold intolerance - tremors - palpitations - anxiety/depression - hyperdefecation/constipation - weight loss - weight gain - dry skin - hair falling - problems with concentration  Pt does not have a FH of thyroid ds. No FH of thyroid cancer. No h/o radiation tx to head or neck.  No seaweed or kelp, no recent contrast studies. No steroid use. No herbal supplements.   I reviewed her chart and she also has a history of stomach cancer - sx 2012, DM2.   ROS: Constitutional: no weight gain/loss, no fatigue, no subjective hyperthermia/hypothermia Eyes: no blurry vision, no xerophthalmia ENT: no sore throat, no nodules palpated in throat, no dysphagia/odynophagia, no hoarseness Cardiovascular: no CP/SOB/palpitations/leg swelling Respiratory: no cough/SOB Gastrointestinal: no N/V/D/C Musculoskeletal: no muscle/joint aches Skin: no  rashes Neurological: no tremors/numbness/tingling/dizziness Psychiatric: no depression/anxiety  Past Medical History  Diagnosis Date  . stomach ca dx'd 2010    chemo/xrt comp 05/2009  . Hypertension   . Diabetes mellitus   . Asthma   . Sleep apnea   . Degenerative joint disease   . Hyperlipidemia   . GERD (gastroesophageal reflux disease)    Past Surgical History  Procedure Laterality Date  . Total knee arthroplasty      Left  . Total hip arthroplasty      Right  . Shoulder surgery      Left  . Appendectomy    . Cesarean section    . Hernia repair    . Colon surgery    . Esophagogastroduodenoscopy N/A 10/15/2012    Procedure: ESOPHAGOGASTRODUODENOSCOPY (EGD);  Surgeon: Lear Ng, MD;  Location: [facility];  Service: Endoscopy;  Laterality: N/A;   History   Social History  . Marital Status: Divorced    Spouse Name: N/A  . Number of Children: N/A   Occupational History  . Not on file.   Social History Main Topics  . Smoking status: Former Smoker -- 1.00 packs/day for 15 years    Types: Cigarettes    Start date: 09/27/1973    Quit date: 10/15/1988  . Smokeless tobacco: Never Used     Comment: quit smoking 14 years ago  . Alcohol Use: No  . Drug Use: No   Current Outpatient Prescriptions on File Prior to Visit  Medication Sig Dispense Refill  . albuterol (PROVENTIL HFA;VENTOLIN HFA) 108 (90 BASE) MCG/ACT inhaler Inhale 2  puffs into lungs every 8 hours prn wheezing 1 Inhaler 0  . glimepiride (AMARYL) 4 MG tablet TAKE 1 TABLET (4 MG TOTAL) BY MOUTH 2 (TWO) TIMES DAILY. 90 tablet 1  . lidocaine-prilocaine (EMLA) cream Apply 1 application topically as needed. 30 g 12  . metFORMIN (GLUCOPHAGE) 850 MG tablet TAKE 1 TABLET BY MOUTH TWICE DAILY 60 tablet 3  . montelukast (SINGULAIR) 10 MG tablet Take 1 tablet (10 mg total) by mouth at bedtime. 90 tablet 1  . Multiple Vitamin (MULTIVITAMIN WITH MINERALS) TABS tablet Take 1 tablet by mouth daily.    Marland Kitchen  senna-docusate (SENOKOT-S) 8.6-50 MG per tablet Take 2 tablets by mouth daily. 20 tablet 0  . tiZANidine (ZANAFLEX) 4 MG tablet Take 1 tablet (4 mg total) by mouth every 6 (six) hours as needed for muscle spasms. 90 tablet 2  . traMADol (ULTRAM) 50 MG tablet Take 1 tablet (50 mg total) by mouth every 8 (eight) hours as needed. 30 tablet 3  . carvedilol (COREG) 12.5 MG tablet Take 12.5 mg by mouth every morning.    Marland Kitchen FLUoxetine (PROZAC) 20 MG capsule TAKE 3 CAPSULES (60 MG TOTAL) BY MOUTH DAILY. (Patient not taking: Reported on 10/16/2014) 90 capsule 0  . meclizine (ANTIVERT) 25 MG tablet Take 1 tablet (25 mg total) by mouth 3 (three) times daily as needed for dizziness. (Patient not taking: Reported on 10/16/2014) 30 tablet 0  . mirtazapine (REMERON) 30 MG tablet TAKE 1 TABLET (30 MG TOTAL) BY MOUTH AT BEDTIME. (Patient not taking: Reported on 10/16/2014) 30 tablet 0  . prochlorperazine (COMPAZINE) 10 MG tablet Take 1 tablet (10 mg total) by mouth every 6 (six) hours as needed for nausea or vomiting. (Patient not taking: Reported on 10/16/2014) 60 tablet 4  . triamterene-hydrochlorothiazide (DYAZIDE) 37.5-25 MG per capsule Take 1 capsule by mouth daily.      No current facility-administered medications on file prior to visit.   Allergies  Allergen Reactions  . Codeine Other (See Comments)    paranoid  . Zocor [Simvastatin - High Dose] Other (See Comments)    Muscle spasms  . Penicillins Rash   Family History  Problem Relation Age of Onset  . Cancer Mother   . Alzheimer's disease Father   . Diabetes Brother   . Diabetes Maternal Grandmother   . Cancer Maternal Grandfather     lung  . Suicidality Neg Hx   . Depression Neg Hx   . Dementia Neg Hx   . Anxiety disorder Neg Hx    PE: BP 112/62 mmHg  Pulse 96  Temp(Src) 98.6 F (37 C) (Oral)  Resp 14  Wt 257 lb (116.574 kg) Wt Readings from Last 3 Encounters:  10/16/14 257 lb (116.574 kg)  09/25/14 258 lb (117.028 kg)  09/18/14 257 lb  (116.574 kg)   Constitutional: overweight, in NAD Eyes: PERRLA, EOMI, no exophthalmos ENT: moist mucous membranes, no thyromegaly, no cervical lymphadenopathy Cardiovascular: RRR, No MRG Respiratory: CTA B Gastrointestinal: abdomen soft, NT, ND, BS+ Musculoskeletal: no deformities, strength intact in all 4;  Skin: moist, warm, no rashes Neurological: no tremor with outstretched hands, DTR normal in all 4  ASSESSMENT: 1. MNG - thyroid U/S (09/27/2014): Right thyroid lobe: 54 x 22 x 25 mm. Upper pole nodule measures 2 x 1 x 1.5 cm. This appears relatively inhomogeneous and solid. Midpole nodule measures 3.5 x 2.2 x 2.3 cm and shows numerous areas of cystic and solid components. It is also somewhat hypervascular. In the lower  pole there is a 14 x 12 x 8 mm homogeneous echogenic nodule.  Left thyroid lobe: 62 x 17 x 21 mm. There is a 7 x 7 x 10 mm upper pole nodule which appears complex cystic. In the midpole there is a 3 x 1.5 x 2 cm nodule that appears heterogeneous and mostly solid. In the lower pole there is a 2 x 1.5 x 2 cm solid mildly heterogeneous nodule.  Isthmus Thickness: 3 mm. 7 mm nodule in the isthmus.  Lymphadenopathy - None visualized.   PLAN: 1. MNG  - I reviewed the images of her thyroid ultrasound along with the patient. I pointed out that the dominant nodules are large, this being a risk factor for cancer. Otherwise, some of the nodules are hyperechoic, some isoechoic. They do not have increased blood flow. No calcifications.  Pt does not have a thyroid cancer family history or a personal history of RxTx to head/neck. All these would favor benignity.   - the only way that we can tell exactly if it is cancer or not is by doing a thyroid biopsy (FNA). I explained what the test entails. I would suggest to Bx the R middle, the L middle and L lower nodules. The R lower nodule is hyperechoic, with a lower risk of cancer, while the R upper nodule was originally hot,  also with a lower risk of cancer.  - Since she is very uncomfortable with the thyroid Bx's, we discussed about other options, to wait for another 1-2 years and see if the nodule grows, and only intervene at that time. It is reassuring that the nodules appearance is not much changed from 2011. There is also an option for just following them by U/S or to have thyroidectomy. - pt will think about the options and let me know what she decides.  - I'll see her back in a year for they thyroid pb but she will need to schedule a new appt to address her DM  - time spent with the patient: 40 min, of which >50% was spent in obtaining information about her thyroid nodules, reviewing her previous labs and evaluations, counseling her about her condition (please see the discussed topics above), and developing a plan to further investigate it; she had a number of questions which I addressed.

## 2014-10-22 ENCOUNTER — Other Ambulatory Visit: Payer: Self-pay | Admitting: *Deleted

## 2014-10-22 ENCOUNTER — Encounter: Payer: Self-pay | Admitting: Internal Medicine

## 2014-10-22 ENCOUNTER — Ambulatory Visit (INDEPENDENT_AMBULATORY_CARE_PROVIDER_SITE_OTHER): Payer: Medicare HMO | Admitting: Internal Medicine

## 2014-10-22 VITALS — BP 115/75 | HR 80 | Resp 16 | Ht 67.0 in | Wt 257.0 lb

## 2014-10-22 DIAGNOSIS — E042 Nontoxic multinodular goiter: Secondary | ICD-10-CM | POA: Diagnosis not present

## 2014-10-22 DIAGNOSIS — E118 Type 2 diabetes mellitus with unspecified complications: Secondary | ICD-10-CM

## 2014-10-22 DIAGNOSIS — G629 Polyneuropathy, unspecified: Secondary | ICD-10-CM | POA: Diagnosis not present

## 2014-10-22 LAB — BASIC METABOLIC PANEL
BUN: 21 mg/dL (ref 6–23)
CO2: 27 mEq/L (ref 19–32)
CREATININE: 1.1 mg/dL (ref 0.50–1.10)
Calcium: 10.4 mg/dL (ref 8.4–10.5)
Chloride: 102 mEq/L (ref 96–112)
Glucose, Bld: 124 mg/dL — ABNORMAL HIGH (ref 70–99)
Potassium: 5.1 mEq/L (ref 3.5–5.3)
Sodium: 138 mEq/L (ref 135–145)

## 2014-10-22 LAB — HEMOGLOBIN A1C
Hgb A1c MFr Bld: 8.6 % — ABNORMAL HIGH (ref <5.7)
Mean Plasma Glucose: 200 mg/dL — ABNORMAL HIGH (ref <117)

## 2014-10-22 MED ORDER — HYDROCHLOROTHIAZIDE 25 MG PO TABS: 25.0000 mg | ORAL_TABLET | Freq: Every day | ORAL | Status: DC

## 2014-10-22 MED ORDER — CARVEDILOL 12.5 MG PO TABS: 12.5000 mg | ORAL_TABLET | Freq: Every morning | ORAL | Status: DC

## 2014-10-22 MED ORDER — GABAPENTIN 100 MG PO CAPS: ORAL_CAPSULE | ORAL | Status: DC

## 2014-10-22 NOTE — Progress Notes (Signed)
Subjective:    Patient ID: [name], female    DOB: 08/08/1950, 64 y.o.   MRN: [id number]  HPI  10/16/2014 endocrine note PLAN: 1. MNG  - I reviewed the images of her thyroid ultrasound along with the patient. I pointed out that the dominant nodules are large, this being a risk factor for cancer. Otherwise, some of the nodules are hyperechoic, some isoechoic. They do not have increased blood flow. No calcifications. Pt does not have a thyroid cancer family history or a personal history of RxTx to head/neck. All these would favor benignity.  - the only way that we can tell exactly if it is cancer or not is by doing a thyroid biopsy (FNA). I explained what the test entails. I would suggest to Bx the R middle, the L middle and L lower nodules. The R lower nodule is hyperechoic, with a lower risk of cancer, while the R upper nodule was originally hot, also with a lower risk of cancer.  - Since she is very uncomfortable with the thyroid Bx's, we discussed about other options, to wait for another 1-2 years and see if the nodule grows, and only intervene at that time. It is reassuring that the nodules appearance is not much changed from 2011. There is also an option for just following them by U/S or to have thyroidectomy. - pt will think about the options and let me know what she decides.  - I'll see her back in a year for they thyroid pb but she will need to schedule a new appt to address her DM  - time spent with the patient: 40 min, of which >50% was spent in obtaining information about her thyroid nodules, reviewing her previous labs and evaluations, counseling her about her condition (please see the discussed topics above), and developing a plan to further investigate it; she had a number of questions which I addressed.  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  Constitutional: She is oriented to person, place, and time. She appears well-developed and well-nourished.  HENT:  Head: Normocephalic and atraumatic.  Cardiovascular: Normal rate and regular rhythm. Exam reveals no gallop and no friction rub.  No murmur heard.  Pulmonary/Chest: Breath sounds normal. She has no wheezes. She has no rales.  Neurological: She is alert and oriented to person, place, and time.  Skin: Skin is warm and dry.  EXt   Decreased bil pedal pulses.  2nd digit right foot nail thickened discolored   Psychiatric: She has a normal mood and affect. Her behavior is normal.  Assessment & Plan:  Peripheral neuropathy  She was intolerant to Lyrica .  Will start Neurontin 100 mg hs one week taper to 300 mg at hs.  Will also check for PAD with bilateral arterial doppler   Dystrophic nail   Pt denies injury  With slightly decreased pulses will rule out PAD  Further management based on results  DM  Poor control in the past  Will check AIC  MNG  Pt considering options for BX, versus excison vs waiting   See me in 3 weeks

## 2014-10-22 NOTE — Telephone Encounter (Signed)
Refill request

## 2014-10-27 ENCOUNTER — Ambulatory Visit (HOSPITAL_COMMUNITY)
Admission: RE | Admit: 2014-10-27 | Discharge: 2014-10-27 | Disposition: A | Discharge status: Home / Self Care | Payer: Medicare HMO | Source: Ambulatory Visit | Attending: Internal Medicine | Admitting: Internal Medicine

## 2014-10-27 DIAGNOSIS — I10 Essential (primary) hypertension: Secondary | ICD-10-CM

## 2014-10-27 DIAGNOSIS — E118 Type 2 diabetes mellitus with unspecified complications: Secondary | ICD-10-CM

## 2014-10-27 NOTE — Progress Notes (Signed)
VASCULAR LAB PRELIMINARY  ARTERIAL  ABI completed:    RIGHT    LEFT    PRESSURE WAVEFORM  PRESSURE WAVEFORM  BRACHIAL 124 Triphasic BRACHIAL 122 Triphasic  DP 111 Triphasic DP 121 Triphasic  PT 113 Triphasic PT 130 Triphasic    RIGHT LEFT  ABI 1.06 1.05   Post exercise ABIs were Right - 1.13, Left - 1.02  Results indicate normal arterial flow bilaterally pre and post exercise. Results are not consistent with claudication.  Myrikal Messmer, RVS 10/27/2014, 10:22 AM

## 2014-10-29 ENCOUNTER — Telehealth: Payer: Self-pay | Admitting: Internal Medicine

## 2014-10-29 DIAGNOSIS — L609 Nail disorder, unspecified: Secondary | ICD-10-CM

## 2014-10-29 NOTE — Telephone Encounter (Signed)
Appt Tuesday 11/04/14 9:00am - Pt notified

## 2014-10-29 NOTE — Telephone Encounter (Signed)
Spoke with pt and informed of arterial doppler results  Will refer to podiatry for dystrophic nail of left foot

## 2014-11-04 ENCOUNTER — Ambulatory Visit (INDEPENDENT_AMBULATORY_CARE_PROVIDER_SITE_OTHER): Payer: Medicare HMO | Admitting: Podiatry

## 2014-11-04 ENCOUNTER — Encounter: Payer: Self-pay | Admitting: Podiatry

## 2014-11-04 VITALS — BP 138/96 | HR 83 | Ht 67.0 in | Wt 257.0 lb

## 2014-11-04 DIAGNOSIS — M79606 Pain in leg, unspecified: Secondary | ICD-10-CM

## 2014-11-04 DIAGNOSIS — B351 Tinea unguium: Secondary | ICD-10-CM | POA: Diagnosis not present

## 2014-11-04 NOTE — Progress Notes (Signed)
Subjective: 64 year old female presents for diabetic foot care. Stated that she needs her nails cut. Stated that her blood sugar runs between 110-125. Been diabetic 15 years.  Has numbness on feet and gets cramps at night. Taking Neurontin, which is helping. Has had chemo and radiation therapy for stomach cancer 4 years ago.   Review of Systems - General ROS: negative for - chills, fatigue, fever, hot flashes, night sweats or sleep disturbance Ophthalmic ROS: negative ENT ROS: negative Allergy and Immunology ROS: negative Hematological and Lymphatic ROS: negative Endocrine ROS: Thyroid nodules and under medical care.  Breast ROS: negative for breast lumps Respiratory ROS: no cough, shortness of breath, or wheezing Cardiovascular ROS: no chest pain or dyspnea on exertion Gastrointestinal ROS: no abdominal pain, change in bowel habits, or black or bloody stools Genito-Urinary ROS: no dysuria, trouble voiding, or hematuria Musculoskeletal ROS: Arthritic pain in back. Neurological ROS: Suffering from nerve pain in hands and feet. Dermatological ROS: negative  Objective: Dermatologic: Thick discolored and deformed nail 2nd left. Hypertrophic nails x 10. Vascular: All pedal pulses are palpable. No edema or erythema noted. Neurologic: Positive response to Monofilament sensory testing on both feet. Orthopedic: Severe contracted 2nd digit with plantar flexed distal end and deformed nail left.   Assessment: Onychomycosis x 10. Diabetic neuropathy. Painful feet. 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  Plan: Reviewed findings. All nails debrided.

## 2014-11-04 NOTE — Patient Instructions (Signed)
Seen for diabetic foot care. All nails debrided. Return in 3 months or as needed.

## 2014-11-12 ENCOUNTER — Ambulatory Visit: Payer: Self-pay | Admitting: Internal Medicine

## 2014-11-20 ENCOUNTER — Ambulatory Visit (HOSPITAL_BASED_OUTPATIENT_CLINIC_OR_DEPARTMENT_OTHER): Payer: Medicare HMO

## 2014-11-20 DIAGNOSIS — C169 Malignant neoplasm of stomach, unspecified: Secondary | ICD-10-CM

## 2014-11-20 DIAGNOSIS — Z85028 Personal history of other malignant neoplasm of stomach: Secondary | ICD-10-CM | POA: Diagnosis not present

## 2014-11-20 DIAGNOSIS — Z452 Encounter for adjustment and management of vascular access device: Secondary | ICD-10-CM | POA: Diagnosis not present

## 2014-11-20 MED ORDER — HEPARIN SOD (PORK) LOCK FLUSH 100 UNIT/ML IV SOLN
500.0000 [IU] | Freq: Once | INTRAVENOUS | Status: AC | PRN
  Administered 2014-11-20: 500 [IU] via INTRAVENOUS
  Filled 2014-11-20: qty 5

## 2014-11-20 MED ORDER — SODIUM CHLORIDE 0.9 % IJ SOLN
10.0000 mL | INTRAMUSCULAR | Status: DC | PRN
  Administered 2014-11-20: 10 mL via INTRAVENOUS
  Filled 2014-11-20: qty 10

## 2014-11-27 ENCOUNTER — Other Ambulatory Visit (HOSPITAL_COMMUNITY): Payer: Self-pay | Admitting: Psychiatry

## 2014-12-04 ENCOUNTER — Ambulatory Visit: Payer: Self-pay | Admitting: Neurology

## 2014-12-10 ENCOUNTER — Ambulatory Visit: Payer: Self-pay | Admitting: Neurology

## 2014-12-25 ENCOUNTER — Other Ambulatory Visit: Payer: Self-pay | Admitting: Internal Medicine

## 2014-12-25 ENCOUNTER — Telehealth: Payer: Self-pay | Admitting: *Deleted

## 2014-12-25 NOTE — Telephone Encounter (Signed)
12/25/2014 Patient called and said Dr. Frederich Balding  closed her office and will Dr. Caffie Pinto fill her [place] 100MG. She is almost out, She goes to [facility] @ [phone]. I told her I would call her back after I talked with Dr. Caffie Pinto tomorrow.

## 2014-12-26 MED ORDER — GABAPENTIN 100 MG PO CAPS: ORAL_CAPSULE | ORAL | Status: DC

## 2015-01-02 ENCOUNTER — Telehealth: Payer: Self-pay | Admitting: Internal Medicine

## 2015-01-02 ENCOUNTER — Encounter: Payer: Self-pay | Admitting: Physician Assistant

## 2015-01-02 ENCOUNTER — Ambulatory Visit (INDEPENDENT_AMBULATORY_CARE_PROVIDER_SITE_OTHER): Payer: Medicare HMO | Admitting: Physician Assistant

## 2015-01-02 VITALS — BP 112/81 | HR 86 | Temp 98.7°F | Resp 16 | Ht 67.0 in | Wt 265.0 lb

## 2015-01-02 DIAGNOSIS — E1142 Type 2 diabetes mellitus with diabetic polyneuropathy: Secondary | ICD-10-CM | POA: Diagnosis not present

## 2015-01-02 DIAGNOSIS — J019 Acute sinusitis, unspecified: Secondary | ICD-10-CM

## 2015-01-02 DIAGNOSIS — R200 Anesthesia of skin: Secondary | ICD-10-CM | POA: Diagnosis not present

## 2015-01-02 DIAGNOSIS — C169 Malignant neoplasm of stomach, unspecified: Secondary | ICD-10-CM

## 2015-01-02 DIAGNOSIS — B9689 Other specified bacterial agents as the cause of diseases classified elsewhere: Secondary | ICD-10-CM

## 2015-01-02 LAB — VITAMIN B12: VITAMIN B 12: 349 pg/mL (ref 211–911)

## 2015-01-02 MED ORDER — DOXYCYCLINE HYCLATE 100 MG PO CAPS: 100.0000 mg | ORAL_CAPSULE | Freq: Two times a day (BID) | ORAL | Status: DC

## 2015-01-02 MED ORDER — GABAPENTIN 300 MG PO CAPS: ORAL_CAPSULE | ORAL | Status: DC

## 2015-01-02 MED ORDER — TRAMADOL HCL 50 MG PO TABS: 50.0000 mg | ORAL_TABLET | Freq: Two times a day (BID) | ORAL | Status: DC | PRN

## 2015-01-02 NOTE — Telephone Encounter (Signed)
Ok to phone refill in to pharmacy -- see Rx Tramadol above.

## 2015-01-02 NOTE — Patient Instructions (Signed)
Please start taking the new Gabapentin prescription as directed. Continue your diabetes Medication. Go to the lab for blood work. I will call you with your results.  Please take antibiotic as directed.  Increase fluid intake.  Use Saline nasal spray.  Take a daily multivitamin.  Place a humidifier in the bedroom.  Please call or return clinic if symptoms are not improving.  Sinusitis Sinusitis is redness, soreness, and swelling (inflammation) of the paranasal sinuses. Paranasal sinuses are air pockets within the bones of your face (beneath the eyes, the middle of the forehead, or above the eyes). In healthy paranasal sinuses, mucus is able to drain out, and air is able to circulate through them by way of your nose. However, when your paranasal sinuses are inflamed, mucus and air can become trapped. This can allow bacteria and other germs to grow and cause infection. Sinusitis can develop quickly and last only a short time (acute) or continue over a long period (chronic). Sinusitis that lasts for more than 12 weeks is considered chronic.  CAUSES  Causes of sinusitis include:  Allergies.  Structural abnormalities, such as displacement of the cartilage that separates your nostrils (deviated septum), which can decrease the air flow through your nose and sinuses and affect sinus drainage.  Functional abnormalities, such as when the small hairs (cilia) that line your sinuses and help remove mucus do not work properly or are not present. SYMPTOMS  Symptoms of acute and chronic sinusitis are the same. The primary symptoms are pain and pressure around the affected sinuses. Other symptoms include:  Upper toothache.  Earache.  Headache.  Bad breath.  Decreased sense of smell and taste.  A cough, which worsens when you are lying flat.  Fatigue.  Fever.  Thick drainage from your nose, which often is green and may contain pus (purulent).  Swelling and warmth over the affected  sinuses. DIAGNOSIS  Your caregiver will perform a physical exam. During the exam, your caregiver may:  Look in your nose for signs of abnormal growths in your nostrils (nasal polyps).  Tap over the affected sinus to check for signs of infection.  View the inside of your sinuses (endoscopy) with a special imaging device with a light attached (endoscope), which is inserted into your sinuses. If your caregiver suspects that you have chronic sinusitis, one or more of the following tests may be recommended:  Allergy tests.  Nasal culture A sample of mucus is taken from your nose and sent to a lab and screened for bacteria.  Nasal cytology A sample of mucus is taken from your nose and examined by your caregiver to determine if your sinusitis is related to an allergy. TREATMENT  Most cases of acute sinusitis are related to a viral infection and will resolve on their own within 10 days. Sometimes medicines are prescribed to help relieve symptoms (pain medicine, decongestants, nasal steroid sprays, or saline sprays).  However, for sinusitis related to a bacterial infection, your caregiver will prescribe antibiotic medicines. These are medicines that will help kill the bacteria causing the infection.  Rarely, sinusitis is caused by a fungal infection. In theses cases, your caregiver will prescribe antifungal medicine. For some cases of chronic sinusitis, surgery is needed. Generally, these are cases in which sinusitis recurs more than 3 times per year, despite other treatments. HOME CARE INSTRUCTIONS   Drink plenty of water. Water helps thin the mucus so your sinuses can drain more easily.  Use a humidifier.  Inhale steam 3 to 4  times a day (for example, sit in the bathroom with the shower running).  Apply a warm, moist washcloth to your face 3 to 4 times a day, or as directed by your caregiver.  Use saline nasal sprays to help moisten and clean your sinuses.  Take over-the-counter or  prescription medicines for pain, discomfort, or fever only as directed by your caregiver. SEEK IMMEDIATE MEDICAL CARE IF:  You have increasing pain or severe headaches.  You have nausea, vomiting, or drowsiness.  You have swelling around your face.  You have vision problems.  You have a stiff neck.  You have difficulty breathing. MAKE SURE YOU:   Understand these instructions.  Will watch your condition.  Will get help right away if you are not doing well or get worse. Document Released: 07/11/2005 Document Revised: 10/03/2011 Document Reviewed: 07/26/2011 [facility] Patient Information 2014 [place], [place].

## 2015-01-02 NOTE — Telephone Encounter (Signed)
Medication called to [facility] on [address]- patient notified.

## 2015-01-02 NOTE — Telephone Encounter (Signed)
Notified patient of recommendations and she stated understanding.  She states she would like to try the Tramadol again.

## 2015-01-02 NOTE — Progress Notes (Signed)
Patient presents to clinic today c/o worsening burning, tingling and numbness of bilateral upper and lower extremities.  Has history of controlled diabetes and polyneuropathy.  Is currently on gabapentin 300 mg QHS.   Patient also complains of 1.5 weeks of sinus pressure, sinus pain, nasal congestion and PND.  Endorses low-grade fever and facial pain.  Denies chest pain or SOB. Denies recent travel or sick contact.  Past Medical History  Diagnosis Date  . stomach ca dx'd 2010    chemo/xrt comp 05/2009  . Hypertension   . Diabetes mellitus   . Asthma   . Sleep apnea   . Degenerative joint disease   . Hyperlipidemia   . GERD (gastroesophageal reflux disease)     Current Outpatient Prescriptions on File Prior to Visit  Medication Sig Dispense Refill  . albuterol (PROVENTIL HFA;VENTOLIN HFA) 108 (90 BASE) MCG/ACT inhaler Inhale 2 puffs into lungs every 8 hours prn wheezing 1 Inhaler 0  . carvedilol (COREG) 12.5 MG tablet Take 1 tablet (12.5 mg total) by mouth every morning. 90 tablet 1  . FLUoxetine (PROZAC) 20 MG capsule TAKE 3 CAPSULES (60 MG TOTAL) BY MOUTH DAILY. 90 capsule 0  . glimepiride (AMARYL) 4 MG tablet TAKE 1 TABLET (4 MG TOTAL) BY MOUTH 2 (TWO) TIMES DAILY. 90 tablet 1  . hydrochlorothiazide (HYDRODIURIL) 25 MG tablet Take 1 tablet (25 mg total) by mouth daily. 90 tablet 1  . lidocaine-prilocaine (EMLA) cream Apply 1 application topically as needed. 30 g 12  . metFORMIN (GLUCOPHAGE) 850 MG tablet Take 1 tablet (850 mg total) by mouth 2 (two) times daily. 60 tablet 3  . mirtazapine (REMERON) 30 MG tablet TAKE 1 TABLET (30 MG TOTAL) BY MOUTH AT BEDTIME. 30 tablet 0  . montelukast (SINGULAIR) 10 MG tablet Take 1 tablet (10 mg total) by mouth at bedtime. 90 tablet 1  . Multiple Vitamin (MULTIVITAMIN WITH MINERALS) TABS tablet Take 1 tablet by mouth daily.    . prochlorperazine (COMPAZINE) 10 MG tablet Take 1 tablet (10 mg total) by mouth every 6 (six) hours as needed for nausea  or vomiting. 60 tablet 4  . senna-docusate (SENOKOT-S) 8.6-50 MG per tablet Take 2 tablets by mouth daily. 20 tablet 0  . tiZANidine (ZANAFLEX) 4 MG tablet Take 1 tablet (4 mg total) by mouth every 6 (six) hours as needed for muscle spasms. 90 tablet 2   No current facility-administered medications on file prior to visit.    Allergies  Allergen Reactions  . Adhesive [Tape] Other (See Comments)    Burn Skin  . Codeine Other (See Comments)    paranoid  . Zocor [Simvastatin - High Dose] Other (See Comments)    Muscle spasms  . Penicillins Rash    Family History  Problem Relation Age of Onset  . Cancer Mother   . Alzheimer's disease Father   . Diabetes Brother   . Diabetes Maternal Grandmother   . Cancer Maternal Grandfather     lung  . Suicidality Neg Hx   . Depression Neg Hx   . Dementia Neg Hx   . Anxiety disorder Neg Hx     History   Social History  . Marital Status: Divorced    Spouse Name: N/A  . Number of Children: N/A  . Years of Education: N/A   Social History Main Topics  . Smoking status: Former Smoker -- 1.00 packs/day for 15 years    Types: Cigarettes    Start date: 09/27/1973  Quit date: 10/15/1988  . Smokeless tobacco: Never Used     Comment: quit smoking 14 years ago  . Alcohol Use: No  . Drug Use: No  . Sexual Activity: No   Other Topics Concern  . None   Social History Narrative   Review of Systems - See HPI.  All other ROS are negative.  BP 112/81 mmHg  Pulse 86  Temp(Src) 98.7 F (37.1 C) (Oral)  Resp 16  Ht 5' 7"  (1.702 m)  Wt 265 lb (120.203 kg)  BMI 41.50 kg/m2  Physical Exam  Constitutional: She is oriented to person, place, and time and well-developed, well-nourished, and in no distress.  HENT:  Head: Normocephalic and atraumatic.  Right Ear: Tympanic membrane, external ear and ear canal normal.  Left Ear: Tympanic membrane, external ear and ear canal normal.  Nose: Right sinus exhibits maxillary sinus tenderness. Left  sinus exhibits maxillary sinus tenderness.  Mouth/Throat: Uvula is midline, oropharynx is clear and moist and mucous membranes are normal.  Eyes: Conjunctivae are normal. Pupils are equal, round, and reactive to light.  Neck: Neck supple.  Cardiovascular: Normal rate, regular rhythm, normal heart sounds and intact distal pulses.   Pulmonary/Chest: Effort normal and breath sounds normal. No respiratory distress. She has no wheezes. She has no rales. She exhibits no tenderness.  Lymphadenopathy:    She has no cervical adenopathy.  Neurological: She is alert and oriented to person, place, and time.  Skin: Skin is warm and dry. No rash noted.  Psychiatric: Affect normal.  Vitals reviewed.   Recent Results (from the past 2160 hour(s))  Hemoglobin A1c     Status: Abnormal   Collection Time: 10/22/14 11:17 AM  Result Value Ref Range   Hgb A1c MFr Bld 8.6 (H) <5.7 %    Comment:                                                                        According to the ADA Clinical Practice Recommendations for 2011, when HbA1c is used as a screening test:     >=6.5%   Diagnostic of Diabetes Mellitus            (if abnormal result is confirmed)   5.7-6.4%   Increased risk of developing Diabetes Mellitus   References:Diagnosis and Classification of Diabetes Mellitus,Diabetes BJSE,8315,17(OHYWV 1):S62-S69 and Standards of Medical Care in         Diabetes - 2011,Diabetes Care,2011,34 (Suppl 1):S11-S61.      Mean Plasma Glucose 200 (H) <117 mg/dL  Basic Metabolic Panel     Status: Abnormal   Collection Time: 10/22/14 11:17 AM  Result Value Ref Range   Sodium 138 135 - 145 mEq/L   Potassium 5.1 3.5 - 5.3 mEq/L   Chloride 102 96 - 112 mEq/L   CO2 27 19 - 32 mEq/L   Glucose, Bld 124 (H) 70 - 99 mg/dL   BUN 21 6 - 23 mg/dL   Creat 1.10 0.50 - 1.10 mg/dL   Calcium 10.4 8.4 - 10.5 mg/dL  B12     Status: None   Collection Time: 01/02/15  7:50 AM  Result Value Ref Range   Vitamin B-12 349 211 -  911 pg/mL  Assessment/Plan: Acute bacterial sinusitis Rx Doxycycline.  Increase fluids.  Rest.  Saline nasal spray.  Probiotic.  Mucinex as directed.  Humidifier in bedroom.  Call or return to clinic if symptoms are not improving.   Diabetic polyneuropathy associated with type 2 diabetes mellitus Will recheck B12 level.  Will increase Gabapentin to 300 mg BID. Pain medication discussed.  Supportive measures reviewed.  Follow-up with PCP as scheduled.

## 2015-01-02 NOTE — Assessment & Plan Note (Signed)
Will recheck B12 level.  Will increase Gabapentin to 300 mg BID. Pain medication discussed.  Supportive measures reviewed.  Follow-up with PCP as scheduled.

## 2015-01-02 NOTE — Telephone Encounter (Signed)
Caller name: Martisha Relation to pt:  Call back number: [phone] Pharmacy:  Reason for call:   Patient states that she is in pain from neuropathy. Would like to know what she should do? She did take meds from this morning

## 2015-01-02 NOTE — Telephone Encounter (Signed)
Just saw her this morning. Take the medication as directed.  Take 2 ES tylenol as directed.  Unfortunately she is allergic to codeine.  She previously endorsed Tramadol subtherapeutic -- can reattempt with new dose of Tramadol if she likes.

## 2015-01-02 NOTE — Progress Notes (Signed)
Pre visit review using our clinic review tool, if applicable. No additional management support is needed unless otherwise documented below in the visit note/SLS  

## 2015-01-02 NOTE — Assessment & Plan Note (Signed)
Rx Doxycycline.  Increase fluids.  Rest.  Saline nasal spray.  Probiotic.  Mucinex as directed.  Humidifier in bedroom.  Call or return to clinic if symptoms are not improving.

## 2015-01-08 ENCOUNTER — Telehealth: Payer: Self-pay | Admitting: Hematology & Oncology

## 2015-01-08 NOTE — Telephone Encounter (Signed)
SPOKE WITH PT REGARDING RESCHEDING 6/23 APPT. PT IS TO CALL BACK AND CONFIRM

## 2015-01-09 ENCOUNTER — Other Ambulatory Visit: Payer: Self-pay | Admitting: Internal Medicine

## 2015-01-09 ENCOUNTER — Encounter: Payer: Self-pay | Admitting: Internal Medicine

## 2015-01-09 ENCOUNTER — Ambulatory Visit (INDEPENDENT_AMBULATORY_CARE_PROVIDER_SITE_OTHER): Payer: Medicare HMO | Admitting: Internal Medicine

## 2015-01-09 VITALS — BP 128/64 | HR 68 | Temp 98.4°F | Resp 20 | Ht 66.0 in | Wt 261.0 lb

## 2015-01-09 DIAGNOSIS — J0110 Acute frontal sinusitis, unspecified: Secondary | ICD-10-CM

## 2015-01-09 MED ORDER — AZELASTINE HCL 0.1 % NA SOLN: 2.0000 | Freq: Every evening | NASAL | Status: DC | PRN

## 2015-01-09 MED ORDER — AZITHROMYCIN 250 MG PO TABS: ORAL_TABLET | ORAL | Status: DC

## 2015-01-09 NOTE — Progress Notes (Signed)
Subjective:    Patient ID: [name], female    DOB: August 09, 1950, 64 y.o.   MRN: [id number]  DOS:  01/09/2015 Type of visit - description :  Interval history: Was seen 01/02/2015 with sinus congestion, prescribed doxycycline, symptoms initially decreased however this morning she got a lot worse: ears congested, facial congestion, runny eyes, dry mouth.    Review of Systems No fever or chills + Yellow nasal discharge No nausea, vomiting, diarrhea. No actual cough or chest congestion  Past Medical History  Diagnosis Date  . stomach ca dx'd 2010    chemo/xrt comp 05/2009  . Hypertension   . Diabetes mellitus   . Asthma   . Sleep apnea   . Degenerative joint disease   . Hyperlipidemia   . GERD (gastroesophageal reflux disease)     Past Surgical History  Procedure Laterality Date  . Total knee arthroplasty      Left  . Total hip arthroplasty      Right  . Shoulder surgery      Left  . Appendectomy    . Cesarean section    . Hernia repair    . Colon surgery    . Esophagogastroduodenoscopy N/A 10/15/2012    Procedure: ESOPHAGOGASTRODUODENOSCOPY (EGD);  Surgeon: Lear Ng, MD;  Location: [facility];  Service: Endoscopy;  Laterality: N/A;    History   Social History  . Marital Status: Divorced    Spouse Name: N/A  . Number of Children: N/A  . Years of Education: N/A   Occupational History  . Not on file.   Social History Main Topics  . Smoking status: Former Smoker -- 1.00 packs/day for 15 years    Types: Cigarettes    Start date: 09/27/1973    Quit date: 10/15/1988  . Smokeless tobacco: Never Used     Comment: quit smoking 14 years ago  . Alcohol Use: No  . Drug Use: No  . Sexual Activity: No   Other Topics Concern  . Not on file   Social History Narrative        Medication List       This list is accurate as of: 01/09/15 11:59 PM.  Always use your most recent med list.               albuterol 108 (90 BASE) MCG/ACT inhaler    Commonly known as:  PROVENTIL HFA;VENTOLIN HFA  Inhale 2 puffs into lungs every 8 hours prn wheezing     azelastine 0.1 % nasal spray  Commonly known as:  ASTELIN  Place 2 sprays into both nostrils at bedtime as needed for rhinitis. Use in each nostril as directed     azithromycin 250 MG tablet  Commonly known as:  ZITHROMAX Z-PAK  2 tabs a day the first day, then 1 tab a day x 4 days     carvedilol 12.5 MG tablet  Commonly known as:  COREG  Take 1 tablet (12.5 mg total) by mouth every morning.     FLUoxetine 20 MG capsule  Commonly known as:  PROZAC  TAKE 3 CAPSULES (60 MG TOTAL) BY MOUTH DAILY.     gabapentin 300 MG capsule  Commonly known as:  NEURONTIN  Take 1 tablet by mouth each morning and HS.     glimepiride 4 MG tablet  Commonly known as:  AMARYL  TAKE 1 TABLET (4 MG TOTAL) BY MOUTH 2 (TWO) TIMES DAILY.     hydrochlorothiazide 25 MG tablet  Commonly known as:  HYDRODIURIL  Take 1 tablet (25 mg total) by mouth daily.     lidocaine-prilocaine cream  Commonly known as:  EMLA  Apply 1 application topically as needed.     metFORMIN 850 MG tablet  Commonly known as:  GLUCOPHAGE  Take 1 tablet (850 mg total) by mouth 2 (two) times daily.     mirtazapine 30 MG tablet  Commonly known as:  REMERON  TAKE 1 TABLET (30 MG TOTAL) BY MOUTH AT BEDTIME.     montelukast 10 MG tablet  Commonly known as:  SINGULAIR  Take 1 tablet (10 mg total) by mouth at bedtime.     multivitamin with minerals Tabs tablet  Take 1 tablet by mouth daily.     prochlorperazine 10 MG tablet  Commonly known as:  COMPAZINE  Take 1 tablet (10 mg total) by mouth every 6 (six) hours as needed for nausea or vomiting.     senna-docusate 8.6-50 MG per tablet  Commonly known as:  Senokot-S  Take 2 tablets by mouth daily.     tiZANidine 4 MG tablet  Commonly known as:  ZANAFLEX  Take 1 tablet (4 mg total) by mouth every 6 (six) hours as needed for muscle spasms.     traMADol 50 MG tablet   Commonly known as:  ULTRAM  Take 1 tablet (50 mg total) by mouth every 12 (twelve) hours as needed.           Objective:   Physical Exam BP 128/64 mmHg  Pulse 68  Temp(Src) 98.4 F (36.9 C) (Oral)  Resp 20  Ht 5' 6"  (1.676 m)  Wt 261 lb (118.389 kg)  BMI 42.15 kg/m2 General:   Well developed, well nourished . NAD.  HEENT:  Normocephalic . Face symmetric, atraumatic TMs retracted, no red Throat symmetric no red Nose extremely congested, sinus with mild TTP throughout Lungs:  CTA B Normal respiratory effort, no intercostal retractions, no accessory muscle use. Heart: RRR,  no murmur.  No pretibial edema bilaterally  Skin: Not pale. Not jaundice Neurologic:  alert & oriented X3.  Speech normal, gait appropriate for age and unassisted Psych--  Cognition and judgment appear intact.  Cooperative with normal attention span and concentration.  Behavior appropriate. No anxious or depressed appearing.        Assessment & Plan:    Sinusitis, Symptoms consistent with sinusitis, she is allergic to penicillin, not responding to doxycycline. Plan: Z-Pak, Flonase, Astelin. She has a follow-up with PCP next week, will have to be reassessed at the time.

## 2015-01-09 NOTE — Progress Notes (Signed)
Pre visit review using our clinic review tool, if applicable. No additional management support is needed unless otherwise documented below in the visit note. 

## 2015-01-09 NOTE — Patient Instructions (Signed)
  Rest, fluids , tylenol  For congestion: take Mucinex   twice a day as needed for congestion OTC Nasocort or Flonase : 2 nasal sprays on each side of the nose daily until you feel better Also take Astelin 2 sprays in each side of the nose every night  Stop doxycycline, start the new anti-metallic Zithromax    Call if not gradually better over the next  10 days Call anytime if the symptoms are severe

## 2015-01-13 ENCOUNTER — Ambulatory Visit: Payer: Medicare HMO

## 2015-01-13 ENCOUNTER — Encounter: Payer: Self-pay | Admitting: Family Medicine

## 2015-01-13 ENCOUNTER — Ambulatory Visit (INDEPENDENT_AMBULATORY_CARE_PROVIDER_SITE_OTHER): Payer: Medicare HMO | Admitting: Family Medicine

## 2015-01-13 ENCOUNTER — Other Ambulatory Visit (HOSPITAL_BASED_OUTPATIENT_CLINIC_OR_DEPARTMENT_OTHER): Payer: Medicare HMO

## 2015-01-13 ENCOUNTER — Ambulatory Visit (HOSPITAL_BASED_OUTPATIENT_CLINIC_OR_DEPARTMENT_OTHER): Payer: Medicare HMO | Admitting: Family

## 2015-01-13 VITALS — BP 118/84 | HR 86 | Temp 98.6°F | Resp 18 | Ht 66.5 in | Wt 259.0 lb

## 2015-01-13 VITALS — BP 126/76 | HR 80 | Temp 98.0°F | Wt 260.0 lb

## 2015-01-13 DIAGNOSIS — Z85028 Personal history of other malignant neoplasm of stomach: Secondary | ICD-10-CM | POA: Diagnosis not present

## 2015-01-13 DIAGNOSIS — E1142 Type 2 diabetes mellitus with diabetic polyneuropathy: Secondary | ICD-10-CM

## 2015-01-13 DIAGNOSIS — E1149 Type 2 diabetes mellitus with other diabetic neurological complication: Secondary | ICD-10-CM

## 2015-01-13 DIAGNOSIS — E1165 Type 2 diabetes mellitus with hyperglycemia: Secondary | ICD-10-CM

## 2015-01-13 DIAGNOSIS — E1141 Type 2 diabetes mellitus with diabetic mononeuropathy: Secondary | ICD-10-CM | POA: Diagnosis not present

## 2015-01-13 DIAGNOSIS — I10 Essential (primary) hypertension: Secondary | ICD-10-CM | POA: Diagnosis not present

## 2015-01-13 DIAGNOSIS — G629 Polyneuropathy, unspecified: Secondary | ICD-10-CM | POA: Diagnosis not present

## 2015-01-13 DIAGNOSIS — IMO0002 Reserved for concepts with insufficient information to code with codable children: Secondary | ICD-10-CM

## 2015-01-13 DIAGNOSIS — C169 Malignant neoplasm of stomach, unspecified: Secondary | ICD-10-CM

## 2015-01-13 DIAGNOSIS — E041 Nontoxic single thyroid nodule: Secondary | ICD-10-CM | POA: Diagnosis not present

## 2015-01-13 DIAGNOSIS — J0110 Acute frontal sinusitis, unspecified: Secondary | ICD-10-CM

## 2015-01-13 LAB — CBC WITH DIFFERENTIAL (CANCER CENTER ONLY)
BASO#: 0.1 10*3/uL (ref 0.0–0.2)
BASO%: 0.6 % (ref 0.0–2.0)
EOS%: 9.6 % — ABNORMAL HIGH (ref 0.0–7.0)
Eosinophils Absolute: 0.8 10*3/uL — ABNORMAL HIGH (ref 0.0–0.5)
HCT: 35.2 % (ref 34.8–46.6)
HGB: 12.2 g/dL (ref 11.6–15.9)
LYMPH#: 3.1 10*3/uL (ref 0.9–3.3)
LYMPH%: 38.8 % (ref 14.0–48.0)
MCH: 30.4 pg (ref 26.0–34.0)
MCHC: 34.7 g/dL (ref 32.0–36.0)
MCV: 88 fL (ref 81–101)
MONO#: 0.7 10*3/uL (ref 0.1–0.9)
MONO%: 9 % (ref 0.0–13.0)
NEUT%: 42 % (ref 39.6–80.0)
NEUTROS ABS: 3.3 10*3/uL (ref 1.5–6.5)
PLATELETS: 368 10*3/uL (ref 145–400)
RBC: 4.01 10*6/uL (ref 3.70–5.32)
RDW: 14 % (ref 11.1–15.7)
WBC: 7.9 10*3/uL (ref 3.9–10.0)

## 2015-01-13 LAB — COMPREHENSIVE METABOLIC PANEL (CC13)
ALT: 17 U/L (ref 0–55)
AST: 16 U/L (ref 5–34)
Albumin: 3.5 g/dL (ref 3.5–5.0)
Alkaline Phosphatase: 70 U/L (ref 40–150)
Anion Gap: 9 mEq/L (ref 3–11)
BUN: 18.7 mg/dL (ref 7.0–26.0)
CALCIUM: 9.7 mg/dL (ref 8.4–10.4)
CHLORIDE: 104 meq/L (ref 98–109)
CO2: 28 meq/L (ref 22–29)
Creatinine: 1.2 mg/dL — ABNORMAL HIGH (ref 0.6–1.1)
EGFR: 54 mL/min/{1.73_m2} — AB (ref >90)
GLUCOSE: 133 mg/dL (ref 70–140)
Potassium: 4.9 mEq/L (ref 3.5–5.1)
Sodium: 141 mEq/L (ref 136–145)
Total Bilirubin: 0.35 mg/dL (ref 0.20–1.20)
Total Protein: 7.3 g/dL (ref 6.4–8.3)

## 2015-01-13 MED ORDER — LEVOFLOXACIN 500 MG PO TABS: 500.0000 mg | ORAL_TABLET | Freq: Every day | ORAL | Status: DC

## 2015-01-13 MED ORDER — HEPARIN SOD (PORK) LOCK FLUSH 100 UNIT/ML IV SOLN
500.0000 [IU] | Freq: Once | INTRAVENOUS | Status: AC | PRN
  Administered 2015-01-13: 500 [IU] via INTRAVENOUS
  Filled 2015-01-13: qty 5

## 2015-01-13 MED ORDER — AZELASTINE HCL 0.1 % NA SOLN: 1.0000 | Freq: Two times a day (BID) | NASAL | Status: DC

## 2015-01-13 MED ORDER — SODIUM CHLORIDE 0.9 % IJ SOLN
10.0000 mL | INTRAMUSCULAR | Status: DC | PRN
  Administered 2015-01-13: 10 mL via INTRAVENOUS
  Filled 2015-01-13: qty 10

## 2015-01-13 MED ORDER — NONFORMULARY OR COMPOUNDED ITEM: Status: DC

## 2015-01-13 MED ORDER — GABAPENTIN 300 MG PO CAPS: 300.0000 mg | ORAL_CAPSULE | Freq: Three times a day (TID) | ORAL | Status: DC

## 2015-01-13 NOTE — Patient Instructions (Signed)

## 2015-01-13 NOTE — Progress Notes (Signed)
Hematology and Oncology Follow Up Visit  [name] [id number] 1950-11-26 64 y.o. 01/13/2015   Principle Diagnosis:  Stage IB (T1, N1, M0) adenocarcinoma of the stomach  Current Therapy:   Observation    Interim History:  [name] is here today for a follow-up. 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 Her Neurontin was increased to 300 mg BID two weeks ago and the neuropathy is still increasing. We will increase this to TID and see if that helps. She has burned her hand and didn't feel it. The area is healing and does not appear infected.  She states that her blood sugars are staying around 120-125.  She has had no problem with infections. No fever, chills, n/v, cough, rash, dizziness, SOB, chest pain, palpitations, diarrhea, blood in urine or stool. She has had some gas and tenderness in her left side that comes and goes occasionally. She takes Senakot for constipation dn this is effective.  She is eating well and staying hydrated. Her weight is stable.  She has an [place] or her thyroid in March which showed several nodules ranging in size from 2cm by 1.5 cm to 3 by 2 cm. Her neck is tender with palpation. Her PCP recommended she have biopsy but she is not sure if she wants that done at this time. She is thinking about it.  She has had no swelling or tenderness, in her extremities. No new aches or pains.   Medications:    Medication List       This list is accurate as of: 01/13/15  9:30 AM.  Always use your most recent med list.               albuterol 108 (90 BASE) MCG/ACT inhaler  Commonly known as:  PROVENTIL HFA;VENTOLIN HFA  Inhale 2 puffs into lungs every 8 hours prn wheezing     azelastine 0.1 % nasal spray  Commonly known as:  ASTELIN  Place 2 sprays into both nostrils at bedtime as needed for rhinitis. Use in each nostril as directed     azithromycin 250 MG tablet  Commonly known as:  ZITHROMAX Z-PAK  2 tabs a day the first day, then 1 tab a day x 4  days     carvedilol 12.5 MG tablet  Commonly known as:  COREG  Take 1 tablet (12.5 mg total) by mouth every morning.     FLUoxetine 20 MG capsule  Commonly known as:  PROZAC  TAKE 3 CAPSULES (60 MG TOTAL) BY MOUTH DAILY.     gabapentin 300 MG capsule  Commonly known as:  NEURONTIN  Take 1 tablet by mouth each morning and HS.     glimepiride 4 MG tablet  Commonly known as:  AMARYL  TAKE 1 TABLET (4 MG TOTAL) BY MOUTH 2 (TWO) TIMES DAILY.     hydrochlorothiazide 25 MG tablet  Commonly known as:  HYDRODIURIL  Take 1 tablet (25 mg total) by mouth daily.     lidocaine-prilocaine cream  Commonly known as:  EMLA  Apply 1 application topically as needed.     metFORMIN 850 MG tablet  Commonly known as:  GLUCOPHAGE  Take 1 tablet (850 mg total) by mouth 2 (two) times daily.     mirtazapine 30 MG tablet  Commonly known as:  REMERON  TAKE 1 TABLET (30 MG TOTAL) BY MOUTH AT BEDTIME.     montelukast 10 MG tablet  Commonly known as:  SINGULAIR  Take 1  tablet (10 mg total) by mouth at bedtime.     multivitamin with minerals Tabs tablet  Take 1 tablet by mouth daily.     prochlorperazine 10 MG tablet  Commonly known as:  COMPAZINE  Take 1 tablet (10 mg total) by mouth every 6 (six) hours as needed for nausea or vomiting.     senna-docusate 8.6-50 MG per tablet  Commonly known as:  Senokot-S  Take 2 tablets by mouth daily.     tiZANidine 4 MG tablet  Commonly known as:  ZANAFLEX  Take 1 tablet (4 mg total) by mouth every 6 (six) hours as needed for muscle spasms.     traMADol 50 MG tablet  Commonly known as:  ULTRAM  Take 1 tablet (50 mg total) by mouth every 12 (twelve) hours as needed.        Allergies:  Allergies  Allergen Reactions  . Adhesive [Tape] Other (See Comments)    Burn Skin  . Codeine Other (See Comments)    paranoid  . Zocor [Simvastatin - High Dose] Other (See Comments)    Muscle spasms  . Penicillins Rash    Past Medical History, Surgical  history, Social history, and Family History were reviewed and updated.  Review of Systems: All other 10 point review of systems is negative.   Physical Exam:  vitals were not taken for this visit.  Wt Readings from Last 3 Encounters:  01/09/15 261 lb (118.389 kg)  01/02/15 265 lb (120.203 kg)  11/04/14 257 lb (116.574 kg)    Ocular: Sclerae unicteric, pupils equal, round and reactive to light Ear-nose-throat: Oropharynx clear, dentition fair Lymphatic: No cervical or supraclavicular adenopathy Lungs no rales or rhonchi, good excursion bilaterally Heart regular rate and rhythm, no murmur appreciated Abd soft, nontender, positive bowel sounds MSK no focal spinal tenderness, no joint edema Neuro: non-focal, well-oriented, appropriate affect Breasts: Deferred  Lab Results  Component Value Date   WBC 6.9 09/25/2014   HGB 12.2 09/25/2014   HCT 37.0 09/25/2014   MCV 88 09/25/2014   PLT 342 09/25/2014   Lab Results  Component Value Date   FERRITIN 306* 09/25/2014   IRON 62 09/25/2014   TIBC 249 09/25/2014   UIBC 187 09/25/2014   IRONPCTSAT 25 09/25/2014   Lab Results  Component Value Date   RETICCTPCT 1.5 04/28/2014   RBC 4.19 09/25/2014   RETICCTABS 59.9 04/28/2014   No results found for: KPAFRELGTCHN, LAMBDASER, KAPLAMBRATIO No results found for: IGGSERUM, IGA, IGMSERUM No results found for: Odetta Pink, SPEI   Chemistry      Component Value Date/Time   NA 138 10/22/2014 1117   NA 146* 09/25/2014 0933   NA 141 11/14/2013 0804   K 5.1 10/22/2014 1117   K 5.1* 09/25/2014 0933   K 4.6 11/14/2013 0804   CL 102 10/22/2014 1117   CL 102 09/25/2014 0933   CO2 27 10/22/2014 1117   CO2 30 09/25/2014 0933   CO2 24 11/14/2013 0804   BUN 21 10/22/2014 1117   BUN 15 09/25/2014 0933   BUN 17.8 11/14/2013 0804   CREATININE 1.10 10/22/2014 1117   CREATININE 1.34* 12/27/2013 1440   CREATININE 1.7* 11/14/2013 0804       Component Value Date/Time   CALCIUM 10.4 10/22/2014 1117   CALCIUM 9.5 09/25/2014 0933   CALCIUM 10.4 11/14/2013 0804   ALKPHOS 82 09/25/2014 0933   ALKPHOS 81 07/31/2014 1334   ALKPHOS 75 11/14/2013 0804  AST 20 09/25/2014 0933   AST 15 07/31/2014 1334   AST 16 11/14/2013 0804   ALT 16 09/25/2014 0933   ALT 12 07/31/2014 1334   ALT 14 11/14/2013 0804   BILITOT 0.50 09/25/2014 0933   BILITOT 0.4 07/31/2014 1334   BILITOT 0.38 11/14/2013 0804     Impression and Plan: [name] is a 64 year old African [place] female with stage IB stomach cancer. She underwent resection and completed adjuvant radiation and chemotherapy 7 years ago. Her blood work today looks good. She is having some issues with worsening neuropathy. I'm not sure how close an eye she is keeping on her blood sugars.  We will increase her Neurontin to 300 mg TID.   She is still considering having her thyroid nodule biopsied. She had her port a cath flushed today. We will continue to do this every 6 weeks.  We will plan to see her back in 3 months for labs and follow-up.  She knows to contact us with any questions or concerns. We can certainly see her sooner if need be.   Eliezer Bottom, NP 6/21/20169:30 AM

## 2015-01-13 NOTE — Progress Notes (Signed)
Patient ID: [name], female    DOB: 05-01-1951  Age: 64 y.o. MRN: [id number]    Subjective:  Subjective HPI [name] presents for f/u sinusitis---feels no better.  astelin dose help at night.   Review of Systems  Constitutional: Negative for fever and chills.  HENT: Positive for congestion, postnasal drip, rhinorrhea and sinus pressure. Negative for sore throat.   Respiratory: Negative for cough, chest tightness, shortness of breath and wheezing.   Cardiovascular: Negative for chest pain, palpitations and leg swelling.  Allergic/Immunologic: Negative for environmental allergies.  Psychiatric/Behavioral: Negative for dysphoric mood, decreased concentration and agitation. The patient is not nervous/anxious and is not hyperactive.     History Past Medical History  Diagnosis Date  . stomach ca dx'd 2010    chemo/xrt comp 05/2009  . Hypertension   . Diabetes mellitus   . Asthma   . Sleep apnea   . Degenerative joint disease   . Hyperlipidemia   . GERD (gastroesophageal reflux disease)     She has past surgical history that includes Total knee arthroplasty; Total hip arthroplasty; Shoulder surgery; Appendectomy; Cesarean section; Hernia repair; Colon surgery; and Esophagogastroduodenoscopy (N/A, 10/15/2012).   Her family history includes Alzheimer's disease in her father; Cancer in her maternal grandfather and mother; Diabetes in her brother and maternal grandmother. There is no history of Suicidality, Depression, Dementia, or Anxiety disorder.She reports that she quit smoking about 26 years ago. Her smoking use included Cigarettes. She started smoking about 41 years ago. She has a 15 pack-year smoking history. She has never used smokeless tobacco. She reports that she does not drink alcohol or use illicit drugs.  Current Outpatient Prescriptions on File Prior to Visit  Medication Sig Dispense Refill  . albuterol (PROVENTIL HFA;VENTOLIN HFA) 108 (90 BASE) MCG/ACT inhaler Inhale  2 puffs into lungs every 8 hours prn wheezing 1 Inhaler 0  . azelastine (ASTELIN) 0.1 % nasal spray Place 2 sprays into both nostrils at bedtime as needed for rhinitis. Use in each nostril as directed 30 mL 3  . carvedilol (COREG) 12.5 MG tablet Take 1 tablet (12.5 mg total) by mouth every morning. 90 tablet 1  . FLUoxetine (PROZAC) 20 MG capsule TAKE 3 CAPSULES (60 MG TOTAL) BY MOUTH DAILY. 90 capsule 0  . glimepiride (AMARYL) 4 MG tablet TAKE 1 TABLET (4 MG TOTAL) BY MOUTH 2 (TWO) TIMES DAILY. 90 tablet 1  . hydrochlorothiazide (HYDRODIURIL) 25 MG tablet Take 1 tablet (25 mg total) by mouth daily. 90 tablet 1  . lidocaine-prilocaine (EMLA) cream Apply 1 application topically as needed. 30 g 12  . metFORMIN (GLUCOPHAGE) 850 MG tablet Take 1 tablet (850 mg total) by mouth 2 (two) times daily. 60 tablet 3  . mirtazapine (REMERON) 30 MG tablet TAKE 1 TABLET (30 MG TOTAL) BY MOUTH AT BEDTIME. 30 tablet 0  . montelukast (SINGULAIR) 10 MG tablet Take 1 tablet (10 mg total) by mouth at bedtime. 90 tablet 1  . Multiple Vitamin (MULTIVITAMIN WITH MINERALS) TABS tablet Take 1 tablet by mouth daily.    . prochlorperazine (COMPAZINE) 10 MG tablet Take 1 tablet (10 mg total) by mouth every 6 (six) hours as needed for nausea or vomiting. 60 tablet 4  . senna-docusate (SENOKOT-S) 8.6-50 MG per tablet Take 2 tablets by mouth daily. 20 tablet 0  . tiZANidine (ZANAFLEX) 4 MG tablet Take 1 tablet (4 mg total) by mouth every 6 (six) hours as needed for muscle spasms. 90 tablet 2  . traMADol (  ULTRAM) 50 MG tablet Take 1 tablet (50 mg total) by mouth every 12 (twelve) hours as needed. 60 tablet 0   No current facility-administered medications on file prior to visit.     Objective:  Objective Physical Exam  Constitutional: She is oriented to person, place, and time. She appears well-developed and well-nourished.  HENT:  Right Ear: External ear normal.  Left Ear: External ear normal.  Nose: Right sinus exhibits  maxillary sinus tenderness and frontal sinus tenderness. Left sinus exhibits maxillary sinus tenderness and frontal sinus tenderness.  Mouth/Throat: Posterior oropharyngeal erythema present. No posterior oropharyngeal edema.  + PND + errythema  Eyes: Conjunctivae are normal. Right eye exhibits no discharge. Left eye exhibits no discharge.  Cardiovascular: Normal rate, regular rhythm and normal heart sounds.   No murmur heard. Pulmonary/Chest: Effort normal and breath sounds normal. No respiratory distress. She has no wheezes. She has no rales. She exhibits no tenderness.  Musculoskeletal: She exhibits no edema.  Lymphadenopathy:    She has cervical adenopathy.  Neurological: She is alert and oriented to person, place, and time.   BP 118/84 mmHg  Pulse 86  Temp(Src) 98.6 F (37 C) (Oral)  Resp 18  Ht 5' 6.5" (1.689 m)  Wt 259 lb (117.482 kg)  BMI 41.18 kg/m2 Wt Readings from Last 3 Encounters:  01/13/15 259 lb (117.482 kg)  01/13/15 260 lb (117.935 kg)  01/09/15 261 lb (118.389 kg)     Lab Results  Component Value Date   WBC 7.9 01/13/2015   HGB 12.2 01/13/2015   HCT 35.2 01/13/2015   PLT 368 01/13/2015   GLUCOSE 133 01/13/2015   CHOL 217* 05/26/2014   TRIG 152.0* 05/26/2014   HDL 46.50 05/26/2014   LDLCALC 140* 05/26/2014   ALT 17 01/13/2015   AST 16 01/13/2015   NA 141 01/13/2015   K 4.9 01/13/2015   CL 102 10/22/2014   CREATININE 1.2* 01/13/2015   BUN 18.7 01/13/2015   CO2 28 01/13/2015   TSH 2.435 07/31/2014   INR 0.98 11/05/2010   HGBA1C 8.6* 10/22/2014    No results found.   Assessment & Plan:  Plan I have discontinued Ms. Galer's azithromycin. I am also having her start on levofloxacin, azelastine, and NONFORMULARY OR COMPOUNDED ITEM. Additionally, I am having her maintain her multivitamin with minerals, senna-docusate, lidocaine-prilocaine, prochlorperazine, albuterol, FLUoxetine, mirtazapine, montelukast, tiZANidine, metFORMIN, carvedilol,  hydrochlorothiazide, traMADol, glimepiride, azelastine, and gabapentin.  Meds ordered this encounter  Medications  . levofloxacin (LEVAQUIN) 500 MG tablet    Sig: Take 1 tablet (500 mg total) by mouth daily.    Dispense:  10 tablet    Refill:  0  . azelastine (ASTELIN) 0.1 % nasal spray    Sig: Place 1 spray into both nostrils 2 (two) times daily. Use in each nostril as directed    Dispense:  30 mL    Refill:  12  . NONFORMULARY OR COMPOUNDED ITEM    Sig: Bmp, hgba1c, hep, lipid, microalbumin, ua----dx DM II uncontrolled with neuropathy and htn    Dispense:  1 each    Refill:  0    Problem List Items Addressed This Visit    Essential hypertension   Relevant Medications   NONFORMULARY OR COMPOUNDED ITEM    Other Visit Diagnoses    Acute frontal sinusitis, recurrence not specified    -  Primary    Relevant Medications    levofloxacin (LEVAQUIN) 500 MG tablet    azelastine (ASTELIN) 0.1 % nasal spray  NONFORMULARY OR COMPOUNDED ITEM    Type II diabetes mellitus with neurological manifestations, uncontrolled        Relevant Medications    NONFORMULARY OR COMPOUNDED ITEM       Follow-up: Return in about 3 months (around 04/15/2015), or if symptoms worsen or fail to improve, for hypertension, hyperlipidemia, diabetes II, fasting.  Garnet Koyanagi, DO

## 2015-01-13 NOTE — Patient Instructions (Signed)

## 2015-01-14 ENCOUNTER — Telehealth: Payer: Self-pay | Admitting: Family Medicine

## 2015-01-14 ENCOUNTER — Encounter: Payer: Self-pay | Admitting: *Deleted

## 2015-01-14 ENCOUNTER — Emergency Department (HOSPITAL_BASED_OUTPATIENT_CLINIC_OR_DEPARTMENT_OTHER)
Admission: EM | Admit: 2015-01-14 | Discharge: 2015-01-14 | Disposition: A | Discharge status: Home / Self Care | Payer: Medicare HMO | Attending: Emergency Medicine | Admitting: Emergency Medicine

## 2015-01-14 ENCOUNTER — Emergency Department (HOSPITAL_BASED_OUTPATIENT_CLINIC_OR_DEPARTMENT_OTHER): Payer: Medicare HMO

## 2015-01-14 ENCOUNTER — Encounter (HOSPITAL_BASED_OUTPATIENT_CLINIC_OR_DEPARTMENT_OTHER): Payer: Self-pay

## 2015-01-14 ENCOUNTER — Other Ambulatory Visit: Payer: Self-pay | Admitting: *Deleted

## 2015-01-14 DIAGNOSIS — Z8739 Personal history of other diseases of the musculoskeletal system and connective tissue: Secondary | ICD-10-CM | POA: Insufficient documentation

## 2015-01-14 DIAGNOSIS — Z79899 Other long term (current) drug therapy: Secondary | ICD-10-CM | POA: Diagnosis not present

## 2015-01-14 DIAGNOSIS — E119 Type 2 diabetes mellitus without complications: Secondary | ICD-10-CM | POA: Diagnosis not present

## 2015-01-14 DIAGNOSIS — Z880 Allergy status to penicillin: Secondary | ICD-10-CM | POA: Diagnosis not present

## 2015-01-14 DIAGNOSIS — Z87891 Personal history of nicotine dependence: Secondary | ICD-10-CM | POA: Diagnosis not present

## 2015-01-14 DIAGNOSIS — J45909 Unspecified asthma, uncomplicated: Secondary | ICD-10-CM | POA: Insufficient documentation

## 2015-01-14 DIAGNOSIS — Z792 Long term (current) use of antibiotics: Secondary | ICD-10-CM | POA: Diagnosis not present

## 2015-01-14 DIAGNOSIS — Z8719 Personal history of other diseases of the digestive system: Secondary | ICD-10-CM | POA: Diagnosis not present

## 2015-01-14 DIAGNOSIS — Y998 Other external cause status: Secondary | ICD-10-CM | POA: Insufficient documentation

## 2015-01-14 DIAGNOSIS — S29012A Strain of muscle and tendon of back wall of thorax, initial encounter: Secondary | ICD-10-CM | POA: Diagnosis not present

## 2015-01-14 DIAGNOSIS — W19XXXA Unspecified fall, initial encounter: Secondary | ICD-10-CM

## 2015-01-14 DIAGNOSIS — Z85028 Personal history of other malignant neoplasm of stomach: Secondary | ICD-10-CM | POA: Insufficient documentation

## 2015-01-14 DIAGNOSIS — Z8669 Personal history of other diseases of the nervous system and sense organs: Secondary | ICD-10-CM | POA: Insufficient documentation

## 2015-01-14 DIAGNOSIS — W010XXA Fall on same level from slipping, tripping and stumbling without subsequent striking against object, initial encounter: Secondary | ICD-10-CM | POA: Diagnosis not present

## 2015-01-14 DIAGNOSIS — S3992XA Unspecified injury of lower back, initial encounter: Secondary | ICD-10-CM | POA: Diagnosis present

## 2015-01-14 DIAGNOSIS — S8992XA Unspecified injury of left lower leg, initial encounter: Secondary | ICD-10-CM | POA: Diagnosis not present

## 2015-01-14 DIAGNOSIS — I10 Essential (primary) hypertension: Secondary | ICD-10-CM | POA: Insufficient documentation

## 2015-01-14 DIAGNOSIS — Y9389 Activity, other specified: Secondary | ICD-10-CM | POA: Insufficient documentation

## 2015-01-14 DIAGNOSIS — S39012A Strain of muscle, fascia and tendon of lower back, initial encounter: Secondary | ICD-10-CM

## 2015-01-14 DIAGNOSIS — M25562 Pain in left knee: Secondary | ICD-10-CM

## 2015-01-14 DIAGNOSIS — Y92009 Unspecified place in unspecified non-institutional (private) residence as the place of occurrence of the external cause: Secondary | ICD-10-CM | POA: Insufficient documentation

## 2015-01-14 DIAGNOSIS — C169 Malignant neoplasm of stomach, unspecified: Secondary | ICD-10-CM

## 2015-01-14 LAB — CBG MONITORING, ED: GLUCOSE-CAPILLARY: 282 mg/dL — AB (ref 65–99)

## 2015-01-14 MED ORDER — DIAZEPAM 5 MG PO TABS
5.0000 mg | ORAL_TABLET | Freq: Once | ORAL | Status: AC
  Administered 2015-01-14: 5 mg via ORAL
  Filled 2015-01-14: qty 1

## 2015-01-14 MED ORDER — NAPROXEN 250 MG PO TABS
500.0000 mg | ORAL_TABLET | Freq: Once | ORAL | Status: AC
  Administered 2015-01-14: 500 mg via ORAL
  Filled 2015-01-14: qty 2

## 2015-01-14 NOTE — ED Notes (Signed)
Tripped/fell at home approx 1 hour PTA-pain to upper back and left knee-denies head/neck injury and LOC-steady gait to traige

## 2015-01-14 NOTE — Progress Notes (Signed)
Patient requesting that labs ordered by PCP be completed at her next appointment with Dr Marin Olp. Checked with Dr Marin Olp and he is okay with this. Lab orders entered as requested by Dr Etter Sjogren and approved by Dr Marin Olp.

## 2015-01-14 NOTE — ED Notes (Signed)
Patient transported to X-ray 

## 2015-01-14 NOTE — ED Notes (Signed)
Family at bedside. 

## 2015-01-14 NOTE — ED Notes (Signed)
MD at bedside. 

## 2015-01-14 NOTE — Telephone Encounter (Signed)
Pt in ed

## 2015-01-14 NOTE — Discharge Instructions (Signed)
Rest, apply ice intermittently for the next 24 hours followed by heat. Avoid heavy lifting or hard physical activity.  Back Pain, Adult Low back pain is very common. About 1 in 5 people have back pain.The cause of low back pain is rarely dangerous. The pain often gets better over time.About half of people with a sudden onset of back pain feel better in just 2 weeks. About 8 in 10 people feel better by 6 weeks.  CAUSES Some common causes of back pain include:  Strain of the muscles or ligaments supporting the spine.  Wear and tear (degeneration) of the spinal discs.  Arthritis.  Direct injury to the back. DIAGNOSIS Most of the time, the direct cause of low back pain is not known.However, back pain can be treated effectively even when the exact cause of the pain is unknown.Answering your caregiver's questions about your overall health and symptoms is one of the most accurate ways to make sure the cause of your pain is not dangerous. If your caregiver needs more information, he or she may order lab work or imaging tests (X-rays or MRIs).However, even if imaging tests show changes in your back, this usually does not require surgery. HOME CARE INSTRUCTIONS For many people, back pain returns.Since low back pain is rarely dangerous, it is often a condition that people can learn to [facility] their own.   Remain active. It is stressful on the back to sit or stand in one place. Do not sit, drive, or stand in one place for more than 30 minutes at a time. Take short walks on level surfaces as soon as pain allows.Try to increase the length of time you walk each day.  Do not stay in bed.Resting more than 1 or 2 days can delay your recovery.  Do not avoid exercise or work.Your body is made to move.It is not dangerous to be active, even though your back may hurt.Your back will likely heal faster if you return to being active before your pain is gone.  Pay attention to your body when you bend and  lift. Many people have less discomfortwhen lifting if they bend their knees, keep the load close to their bodies,and avoid twisting. Often, the most comfortable positions are those that put less stress on your recovering back.  Find a comfortable position to sleep. Use a firm mattress and lie on your side with your knees slightly bent. If you lie on your back, put a pillow under your knees.  Only take over-the-counter or prescription medicines as directed by your caregiver. Over-the-counter medicines to reduce pain and inflammation are often the most helpful.Your caregiver may prescribe muscle relaxant drugs.These medicines help dull your pain so you can more quickly return to your normal activities and healthy exercise.  Put ice on the injured area.  Put ice in a plastic bag.  Place a towel between your skin and the bag.  Leave the ice on for 15-20 minutes, 03-04 times a day for the first 2 to 3 days. After that, ice and heat may be alternated to reduce pain and spasms.  Ask your caregiver about trying back exercises and gentle massage. This may be of some benefit.  Avoid feeling anxious or stressed.Stress increases muscle tension and can worsen back pain.It is important to recognize when you are anxious or stressed and learn ways to manage it.Exercise is a great option. SEEK MEDICAL CARE IF:  You have pain that is not relieved with rest or medicine.  You have pain  that does not improve in 1 week.  You have new symptoms.  You are generally not feeling well. SEEK IMMEDIATE MEDICAL CARE IF:   You have pain that radiates from your back into your legs.  You develop new bowel or bladder control problems.  You have unusual weakness or numbness in your arms or legs.  You develop nausea or vomiting.  You develop abdominal pain.  You feel faint. Document Released: 07/11/2005 Document Revised: 01/10/2012 Document Reviewed: 11/12/2013 [facility] Patient Information 2015 [place],  [place]. This information is not intended to replace advice given to you by your health care provider. Make sure you discuss any questions you have with your health care provider.  Muscle Strain A muscle strain is an injury that occurs when a muscle is stretched beyond its normal length. Usually a small number of muscle fibers are torn when this happens. Muscle strain is rated in degrees. First-degree strains have the least amount of muscle fiber tearing and pain. Second-degree and third-degree strains have increasingly more tearing and pain.  Usually, recovery from muscle strain takes 1-2 weeks. Complete healing takes 5-6 weeks.  CAUSES  Muscle strain happens when a sudden, violent force placed on a muscle stretches it too far. This may occur with lifting, sports, or a fall.  RISK FACTORS Muscle strain is especially common in athletes.  SIGNS AND SYMPTOMS At the site of the muscle strain, there may be:  Pain.  Bruising.  Swelling.  Difficulty using the muscle due to pain or lack of normal function. DIAGNOSIS  Your health care provider will perform a physical exam and ask about your medical history. TREATMENT  Often, the best treatment for a muscle strain is resting, icing, and applying cold compresses to the injured area.  HOME CARE INSTRUCTIONS   Use the PRICE method of treatment to promote muscle healing during the first 2-3 days after your injury. The PRICE method involves:  Protecting the muscle from being injured again.  Restricting your activity and resting the injured body part.  Icing your injury. To do this, put ice in a plastic bag. Place a towel between your skin and the bag. Then, apply the ice and leave it on from 15-20 minutes each hour. After the third day, switch to moist heat packs.  Apply compression to the injured area with a splint or elastic bandage. Be careful not to wrap it too tightly. This may interfere with blood circulation or increase swelling.  Elevate  the injured body part above the level of your heart as often as you can.  Only take over-the-counter or prescription medicines for pain, discomfort, or fever as directed by your health care provider.  Warming up prior to exercise helps to prevent future muscle strains. SEEK MEDICAL CARE IF:   You have increasing pain or swelling in the injured area.  You have numbness, tingling, or a significant loss of strength in the injured area. MAKE SURE YOU:   Understand these instructions.  Will watch your condition.  Will get help right away if you are not doing well or get worse. Document Released: 07/11/2005 Document Revised: 05/01/2013 Document Reviewed: 02/07/2013 [facility] Patient Information 2015 [place], [place]. This information is not intended to replace advice given to you by your health care provider. Make sure you discuss any questions you have with your health care provider.

## 2015-01-14 NOTE — Telephone Encounter (Signed)
Noted.  Message routed to [name].

## 2015-01-14 NOTE — Telephone Encounter (Signed)
Patient states that she had a fall and her sugars are 348. Call transferred to [facility].

## 2015-01-14 NOTE — ED Provider Notes (Signed)
CSN: [id number]     Arrival date & time 01/14/15  1435 History   First MD Initiated Contact with Patient 01/14/15 1501     Chief Complaint  Patient presents with  . Fall     (Consider location/radiation/quality/duration/timing/severity/associated sxs/prior Treatment) HPI Comments: 64 y/o F c/o back pain and L knee pain after tripping over her granddaughter and landed on her right side onto carpet around 2 PM. No head injury. Pain begins in her low back and radiates toward her shoulders, worse with certain movements, rated 6/10. Knee pain described as sore, worse with walking. No alleviating factors tried. Denies numbness or tingling down extremities, loss of control of bowels/bladder or saddle anesthesia. EMS were on scene, checked her CBG and states it was greater than 300. On arrival here, CBG 282. States she did take her diabetes medication this morning and takes it twice daily.  Patient is a 64 y.o. female presenting with fall. The history is provided by the patient.  Fall    Past Medical History  Diagnosis Date  . stomach ca dx'd 2010    chemo/xrt comp 05/2009  . Hypertension   . Diabetes mellitus   . Asthma   . Sleep apnea   . Degenerative joint disease   . Hyperlipidemia   . GERD (gastroesophageal reflux disease)    Past Surgical History  Procedure Laterality Date  . Total knee arthroplasty      Left  . Total hip arthroplasty      Right  . Shoulder surgery      Left  . Appendectomy    . Cesarean section    . Hernia repair    . Colon surgery    . Esophagogastroduodenoscopy N/A 10/15/2012    Procedure: ESOPHAGOGASTRODUODENOSCOPY (EGD);  Surgeon: Lear Ng, MD;  Location: [facility];  Service: Endoscopy;  Laterality: N/A;   Family History  Problem Relation Age of Onset  . Cancer Mother   . Alzheimer's disease Father   . Diabetes Brother   . Diabetes Maternal Grandmother   . Cancer Maternal Grandfather     lung  . Suicidality Neg Hx   . Depression  Neg Hx   . Dementia Neg Hx   . Anxiety disorder Neg Hx    History  Substance Use Topics  . Smoking status: Former Smoker -- 1.00 packs/day for 15 years    Types: Cigarettes    Start date: 09/27/1973    Quit date: 10/15/1988  . Smokeless tobacco: Never Used     Comment: quit smoking 14 years ago  . Alcohol Use: No   OB History    Gravida Para Term Preterm AB TAB SAB Ectopic Multiple Living   4 4        4      Review of Systems  Musculoskeletal: Positive for back pain.       _ L knee pain.  All other systems reviewed and are negative.     Allergies  Adhesive; Codeine; Zocor; and Penicillins  Home Medications   Prior to Admission medications   Medication Sig Start Date End Date Taking? Authorizing Provider  albuterol (PROVENTIL HFA;VENTOLIN HFA) 108 (90 BASE) MCG/ACT inhaler Inhale 2 puffs into lungs every 8 hours prn wheezing 07/31/14   Lanice Shirts, MD  azelastine (ASTELIN) 0.1 % nasal spray Place 2 sprays into both nostrils at bedtime as needed for rhinitis. Use in each nostril as directed 01/09/15   Colon Branch, MD  azelastine (ASTELIN) 0.1 % nasal spray  Place 1 spray into both nostrils 2 (two) times daily. Use in each nostril as directed 01/13/15   Rosalita Chessman, DO  carvedilol (COREG) 12.5 MG tablet Take 1 tablet (12.5 mg total) by mouth every morning. 10/22/14   Lanice Shirts, MD  FLUoxetine (PROZAC) 20 MG capsule TAKE 3 CAPSULES (60 MG TOTAL) BY MOUTH DAILY. 09/09/14   Charlcie Cradle, MD  gabapentin (NEURONTIN) 300 MG capsule Take 1 capsule (300 mg total) by mouth 3 (three) times daily. 01/13/15   Eliezer Bottom, NP  glimepiride (AMARYL) 4 MG tablet TAKE 1 TABLET (4 MG TOTAL) BY MOUTH 2 (TWO) TIMES DAILY. 01/09/15   Philemon Kingdom, MD  hydrochlorothiazide (HYDRODIURIL) 25 MG tablet Take 1 tablet (25 mg total) by mouth daily. 10/22/14   Lanice Shirts, MD  levofloxacin (LEVAQUIN) 500 MG tablet Take 1 tablet (500 mg total) by mouth daily. 01/13/15   Rosalita Chessman, DO  lidocaine-prilocaine (EMLA) cream Apply 1 application topically as needed. 11/20/13   Volanda Napoleon, MD  metFORMIN (GLUCOPHAGE) 850 MG tablet Take 1 tablet (850 mg total) by mouth 2 (two) times daily. 10/16/14   Philemon Kingdom, MD  mirtazapine (REMERON) 30 MG tablet TAKE 1 TABLET (30 MG TOTAL) BY MOUTH AT BEDTIME. 09/09/14   Charlcie Cradle, MD  montelukast (SINGULAIR) 10 MG tablet Take 1 tablet (10 mg total) by mouth at bedtime. 09/18/14   Lanice Shirts, MD  Multiple Vitamin (MULTIVITAMIN WITH MINERALS) TABS tablet Take 1 tablet by mouth daily.    Historical Provider, MD  NONFORMULARY OR COMPOUNDED ITEM Bmp, hgba1c, hep, lipid, microalbumin, ua----dx DM II uncontrolled with neuropathy and htn 01/13/15   Rosalita Chessman, DO  prochlorperazine (COMPAZINE) 10 MG tablet Take 1 tablet (10 mg total) by mouth every 6 (six) hours as needed for nausea or vomiting. 04/21/14   Volanda Napoleon, MD  senna-docusate (SENOKOT-S) 8.6-50 MG per tablet Take 2 tablets by mouth daily. 10/15/13   Carmin Muskrat, MD  tiZANidine (ZANAFLEX) 4 MG tablet Take 1 tablet (4 mg total) by mouth every 6 (six) hours as needed for muscle spasms. 09/25/14   Volanda Napoleon, MD  traMADol (ULTRAM) 50 MG tablet Take 1 tablet (50 mg total) by mouth every 12 (twelve) hours as needed. 01/02/15   Brunetta Jeans, PA-C   BP 96/60 mmHg  Pulse 72  Temp(Src) 98.5 F (36.9 C) (Oral)  Resp 16  Ht 5' 6"  (1.676 m)  Wt 259 lb (117.482 kg)  BMI 41.82 kg/m2  SpO2 96% Physical Exam  Constitutional: She is oriented to person, place, and time. She appears well-developed and well-nourished. No distress.  HENT:  Head: Normocephalic and atraumatic.  Mouth/Throat: Oropharynx is clear and moist.  Eyes: Conjunctivae are normal.  Neck: Normal range of motion. Neck supple. No spinous process tenderness and no muscular tenderness present.  Cardiovascular: Normal rate, regular rhythm and normal heart sounds.   Pulmonary/Chest: Effort  normal and breath sounds normal. No respiratory distress.  Musculoskeletal: She exhibits no edema.  TTP lumbar spine and thoracic paraspinal muscles. No T-spine tenderness. No edema or step-off. L knee TTP infero-lateral to patella. No swelling or deformity. FROM without pain.  Neurological: She is alert and oriented to person, place, and time. She has normal strength.  Strength lower extremities 5/5 and equal bilateral. Sensation intact.  Skin: Skin is warm and dry. No rash noted. She is not diaphoretic.  No bruising or signs of trauma.  Psychiatric: She has a  normal mood and affect. Her behavior is normal.  Nursing note and vitals reviewed.   ED Course  Procedures (including critical care time) Labs Review Labs Reviewed  CBG MONITORING, ED - Abnormal; Notable for the following:    Glucose-Capillary 282 (*)    All other components within normal limits    Imaging Review Dg Lumbar Spine Complete  01/14/2015   CLINICAL DATA:  Lumbago following fall earlier today  EXAM: LUMBAR SPINE - COMPLETE 4+ VIEW  COMPARISON:  December 27, 2013  FINDINGS: Frontal, lateral, spot lumbosacral lateral, and bilateral oblique views were obtained. There are 5 non-rib-bearing lumbar type vertebral bodies. There is no fracture or spondylolisthesis. There is stable disc space narrowing at T11-12. There is no appreciable lumbar disc space narrowing; lumbar discs appear stable. There is facet osteoarthritic change at L3-4, L4-5, and L5-S1 on the left at L4-5 and L5-S1 on the right, stable.  IMPRESSION: Areas of osteoarthritic change, essentially stable. No acute fracture or spondylolisthesis.   Electronically Signed   By: Lowella Grip III M.D.   On: 01/14/2015 16:05   Dg Knee Complete 4 Views Left  01/14/2015   CLINICAL DATA:  Fall  EXAM: LEFT KNEE - COMPLETE 4+ VIEW  COMPARISON:  07/08/2013  FINDINGS: Total knee replacement in satisfactory position and alignment. No loosening  Negative for fracture  IMPRESSION:  Negative for fracture.   Electronically Signed   By: Franchot Gallo M.D.   On: 01/14/2015 16:23     EKG Interpretation None      MDM   Final diagnoses:  Fall  Fall from slip, trip, or stumble, initial encounter  Low back strain, initial encounter  Upper back strain, initial encounter  Left knee pain   NAD. AF the assess. Neurovascularly intact. No red flags concerning patient's back pain. No s/s of central cord compression or cauda equina. Lower extremities are neurovascularly intact and patient is ambulating without difficulty. X-rays without acute finding. Advised her to take the Ultram and Zanaflex that she has at home, rest, ice/heat and follow-up with PCP. Stable for discharge. Return precautions given. Patient states understanding of treatment care plan and is agreeable.  Carman Ching, PA-C 01/14/15 [place], MD 01/14/15 [phone]

## 2015-01-14 NOTE — Telephone Encounter (Signed)
Patient Name: [name]  DOB: 11-Sep-1950    Initial Comment caller states she fell and BS is 348   Nurse Assessment      Guidelines    Guideline Title Affirmed Question Affirmed Notes       Final Disposition User   Send To Clinical Follow Up Jeral Fruit    Comments  I have been unable to reach this patient. I did check EPIC which shows she is currently in the ED. Will fax note to office.

## 2015-01-14 NOTE — Telephone Encounter (Signed)
Received call from Dr. Antonieta Pert office that pt called in about blood sugar being 348. [name] said that pt was asking to talk to a nurse. Called and left msg for pt to call us back.

## 2015-01-14 NOTE — ED Notes (Addendum)
presnts to ED today after a fall, approx 1 hours ago, states tripped, carpeted area. Pt states the EMS ck CBG at scene as states it was >317m/dl, will reck

## 2015-01-15 ENCOUNTER — Ambulatory Visit: Payer: Self-pay | Admitting: Hematology & Oncology

## 2015-01-15 ENCOUNTER — Other Ambulatory Visit: Payer: Self-pay

## 2015-01-16 ENCOUNTER — Ambulatory Visit (INDEPENDENT_AMBULATORY_CARE_PROVIDER_SITE_OTHER): Payer: Medicare HMO | Admitting: Family Medicine

## 2015-01-16 ENCOUNTER — Encounter: Payer: Self-pay | Admitting: Family Medicine

## 2015-01-16 VITALS — BP 136/84 | HR 76 | Temp 98.2°F | Resp 20 | Ht 66.0 in | Wt 259.0 lb

## 2015-01-16 DIAGNOSIS — E1142 Type 2 diabetes mellitus with diabetic polyneuropathy: Secondary | ICD-10-CM | POA: Diagnosis not present

## 2015-01-16 MED ORDER — KETOROLAC TROMETHAMINE 60 MG/2ML IM SOLN
60.0000 mg | Freq: Once | INTRAMUSCULAR | Status: AC
  Administered 2015-01-16: 60 mg via INTRAMUSCULAR

## 2015-01-16 MED ORDER — KETOROLAC TROMETHAMINE 60 MG/2ML IJ SOLN: 60.0000 mg | Freq: Once | INTRAMUSCULAR | Status: DC

## 2015-01-16 MED ORDER — HYDROCODONE-ACETAMINOPHEN 5-325 MG PO TABS: 1.0000 | ORAL_TABLET | Freq: Four times a day (QID) | ORAL | Status: DC | PRN

## 2015-01-16 MED ORDER — DULOXETINE HCL 30 MG PO CPEP: ORAL_CAPSULE | ORAL | Status: DC

## 2015-01-16 MED ORDER — GABAPENTIN 300 MG PO CAPS: ORAL_CAPSULE | ORAL | Status: DC

## 2015-01-16 NOTE — Progress Notes (Signed)
Patient ID: [name], female    DOB: 1951-02-07  Age: 64 y.o. 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  Her knee had been bother her but that is better and there is some pain L rib but it is improving. Pt more pressing issue is the PN.  She is in a lot of pain-- ultram is not helping.     Review of Systems  Constitutional: Negative for activity change, appetite change, fatigue and unexpected weight change.  Respiratory: Negative for cough and shortness of breath.   Cardiovascular: Negative for chest pain and palpitations.  Neurological: Positive for numbness. Negative for dizziness and headaches.  Psychiatric/Behavioral: Negative for behavioral problems and dysphoric mood. The patient is not nervous/anxious.     History Past Medical History  Diagnosis Date  . stomach ca dx'd 2010    chemo/xrt comp 05/2009  . Hypertension   . Diabetes mellitus   . Asthma   . Sleep apnea   . Degenerative joint disease   . Hyperlipidemia   . GERD (gastroesophageal reflux disease)     She has past surgical history that includes Total knee arthroplasty; Total hip arthroplasty; Shoulder surgery; Appendectomy; Cesarean section; Hernia repair; Colon surgery; and Esophagogastroduodenoscopy (N/A, 10/15/2012).   Her family history includes Alzheimer's disease in her father; Cancer in her maternal grandfather and mother; Diabetes in her brother and maternal grandmother. There is no history of Suicidality, Depression, Dementia, or Anxiety disorder.She reports that she quit smoking about 26 years ago. Her smoking use included Cigarettes. She started smoking about 41 years ago. She has a 15 pack-year smoking history. She has never used smokeless tobacco. She reports that she does not drink alcohol or use illicit drugs.  Current Outpatient Prescriptions on File Prior to Visit  Medication Sig Dispense Refill    . albuterol (PROVENTIL HFA;VENTOLIN HFA) 108 (90 BASE) MCG/ACT inhaler Inhale 2 puffs into lungs every 8 hours prn wheezing 1 Inhaler 0  . azelastine (ASTELIN) 0.1 % nasal spray Place 2 sprays into both nostrils at bedtime as needed for rhinitis. Use in each nostril as directed 30 mL 3  . azelastine (ASTELIN) 0.1 % nasal spray Place 1 spray into both nostrils 2 (two) times daily. Use in each nostril as directed 30 mL 12  . carvedilol (COREG) 12.5 MG tablet Take 1 tablet (12.5 mg total) by mouth every morning. 90 tablet 1  . glimepiride (AMARYL) 4 MG tablet TAKE 1 TABLET (4 MG TOTAL) BY MOUTH 2 (TWO) TIMES DAILY. 90 tablet 1  . hydrochlorothiazide (HYDRODIURIL) 25 MG tablet Take 1 tablet (25 mg total) by mouth daily. 90 tablet 1  . levofloxacin (LEVAQUIN) 500 MG tablet Take 1 tablet (500 mg total) by mouth daily. 10 tablet 0  . lidocaine-prilocaine (EMLA) cream Apply 1 application topically as needed. 30 g 12  . metFORMIN (GLUCOPHAGE) 850 MG tablet Take 1 tablet (850 mg total) by mouth 2 (two) times daily. 60 tablet 3  . montelukast (SINGULAIR) 10 MG tablet Take 1 tablet (10 mg total) by mouth at bedtime. 90 tablet 1  . Multiple Vitamin (MULTIVITAMIN WITH MINERALS) TABS tablet Take 1 tablet by mouth daily.    . NONFORMULARY OR COMPOUNDED ITEM Bmp, hgba1c, hep, lipid, microalbumin, ua----dx DM II uncontrolled with neuropathy and htn 1 each 0  . prochlorperazine (COMPAZINE) 10 MG tablet Take 1 tablet (10 mg total) by mouth every 6 (  six) hours as needed for nausea or vomiting. 60 tablet 4  . senna-docusate (SENOKOT-S) 8.6-50 MG per tablet Take 2 tablets by mouth daily. 20 tablet 0  . tiZANidine (ZANAFLEX) 4 MG tablet Take 1 tablet (4 mg total) by mouth every 6 (six) hours as needed for muscle spasms. 90 tablet 2   No current facility-administered medications on file prior to visit.     Objective:  Objective Physical Exam  Constitutional: She is oriented to person, place, and time. She appears  well-developed and well-nourished.  HENT:  Head: Normocephalic and atraumatic.  Eyes: Conjunctivae and EOM are normal.  Neck: Normal range of motion. Neck supple. No JVD present. Carotid bruit is not present. No thyromegaly present.  Cardiovascular: Normal rate, regular rhythm and normal heart sounds.   No murmur heard. Pulmonary/Chest: Effort normal and breath sounds normal. No respiratory distress. She has no wheezes. She has no rales. She exhibits no tenderness.  Musculoskeletal: She exhibits no edema.  Neurological: She is alert and oriented to person, place, and time.  Decreased sensation both feet and numbness and burning pain in both hands and feet  Psychiatric: She has a normal mood and affect. Her behavior is normal.   BP 136/84 mmHg  Pulse 76  Temp(Src) 98.2 F (36.8 C) (Oral)  Resp 20  Ht 5' 6"  (1.676 m)  Wt 259 lb (117.482 kg)  BMI 41.82 kg/m2 Wt Readings from Last 3 Encounters:  01/16/15 259 lb (117.482 kg)  01/14/15 259 lb (117.482 kg)  01/13/15 259 lb (117.482 kg)     Lab Results  Component Value Date   WBC 7.9 01/13/2015   HGB 12.2 01/13/2015   HCT 35.2 01/13/2015   PLT 368 01/13/2015   GLUCOSE 133 01/13/2015   CHOL 217* 05/26/2014   TRIG 152.0* 05/26/2014   HDL 46.50 05/26/2014   LDLCALC 140* 05/26/2014   ALT 17 01/13/2015   AST 16 01/13/2015   NA 141 01/13/2015   K 4.9 01/13/2015   CL 102 10/22/2014   CREATININE 1.2* 01/13/2015   BUN 18.7 01/13/2015   CO2 28 01/13/2015   TSH 2.435 07/31/2014   INR 0.98 11/05/2010   HGBA1C 8.6* 10/22/2014    Dg Lumbar Spine Complete  01/14/2015   CLINICAL DATA:  Lumbago following fall earlier today  EXAM: LUMBAR SPINE - COMPLETE 4+ VIEW  COMPARISON:  December 27, 2013  FINDINGS: Frontal, lateral, spot lumbosacral lateral, and bilateral oblique views were obtained. There are 5 non-rib-bearing lumbar type vertebral bodies. There is no fracture or spondylolisthesis. There is stable disc space narrowing at T11-12. There is  no appreciable lumbar disc space narrowing; lumbar discs appear stable. There is facet osteoarthritic change at L3-4, L4-5, and L5-S1 on the left at L4-5 and L5-S1 on the right, stable.  IMPRESSION: Areas of osteoarthritic change, essentially stable. No acute fracture or spondylolisthesis.   Electronically Signed   By: Lowella Grip III M.D.   On: 01/14/2015 16:05   Dg Knee Complete 4 Views Left  01/14/2015   CLINICAL DATA:  Fall  EXAM: LEFT KNEE - COMPLETE 4+ VIEW  COMPARISON:  07/08/2013  FINDINGS: Total knee replacement in satisfactory position and alignment. No loosening  Negative for fracture  IMPRESSION: Negative for fracture.   Electronically Signed   By: Franchot Gallo M.D.   On: 01/14/2015 16:23     Assessment & Plan:  Plan I have discontinued Ms. Wagoner's FLUoxetine, mirtazapine, and traMADol. I have also changed her gabapentin. Additionally, I am having  her start on HYDROcodone-acetaminophen and DULoxetine. Lastly, I am having her maintain her multivitamin with minerals, senna-docusate, lidocaine-prilocaine, prochlorperazine, albuterol, montelukast, tiZANidine, metFORMIN, carvedilol, hydrochlorothiazide, glimepiride, azelastine, levofloxacin, azelastine, and NONFORMULARY OR COMPOUNDED ITEM. We administered ketorolac.  Meds ordered this encounter  Medications  . gabapentin (NEURONTIN) 300 MG capsule    Sig: 2 po tid    Dispense:  180 capsule    Refill:  3  . HYDROcodone-acetaminophen (NORCO/VICODIN) 5-325 MG per tablet    Sig: Take 1 tablet by mouth every 6 (six) hours as needed for moderate pain.    Dispense:  30 tablet    Refill:  0  . DULoxetine (CYMBALTA) 30 MG capsule    Sig: 1 po qd x 1 week then 2 po qd    Dispense:  60 capsule    Refill:  0  . DISCONTD: ketorolac (TORADOL) injection 60 mg    Sig:   . ketorolac (TORADOL) injection 60 mg    Sig:     Problem List Items Addressed This Visit    Diabetic polyneuropathy associated with type 2 diabetes mellitus - Primary    Relevant Medications   gabapentin (NEURONTIN) 300 MG capsule   HYDROcodone-acetaminophen (NORCO/VICODIN) 5-325 MG per tablet   DULoxetine (CYMBALTA) 30 MG capsule   ketorolac (TORADOL) injection 60 mg (Completed)      Follow-up: Return in about 4 weeks (around 02/13/2015), or if symptoms worsen or fail to improve, for f/u pain and depression.  Garnet Koyanagi, DO

## 2015-01-16 NOTE — Progress Notes (Signed)
Pre visit review using our clinic review tool, if applicable. No additional management support is needed unless otherwise documented below in the visit note. 

## 2015-01-16 NOTE — Patient Instructions (Signed)

## 2015-01-19 ENCOUNTER — Other Ambulatory Visit: Payer: Self-pay

## 2015-01-20 IMAGING — US US ABDOMEN COMPLETE
1 series · 14 of 25 positions shown · non-contrast
Comparison: 05/31/2013

CLINICAL DATA: Abdominal pain

EXAM:
ULTRASOUND ABDOMEN COMPLETE

[Series 1: us abdomen complete · 0.28mm/px · 14 of 72 slices shown]
[im 1/72]
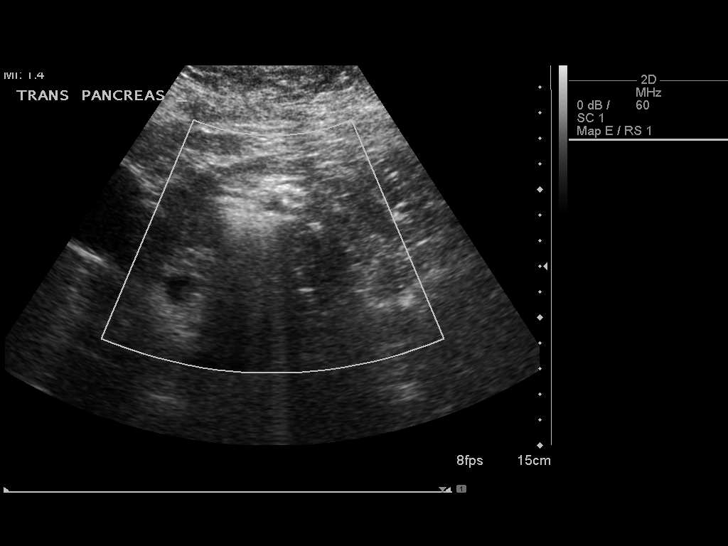
[im 6/72]
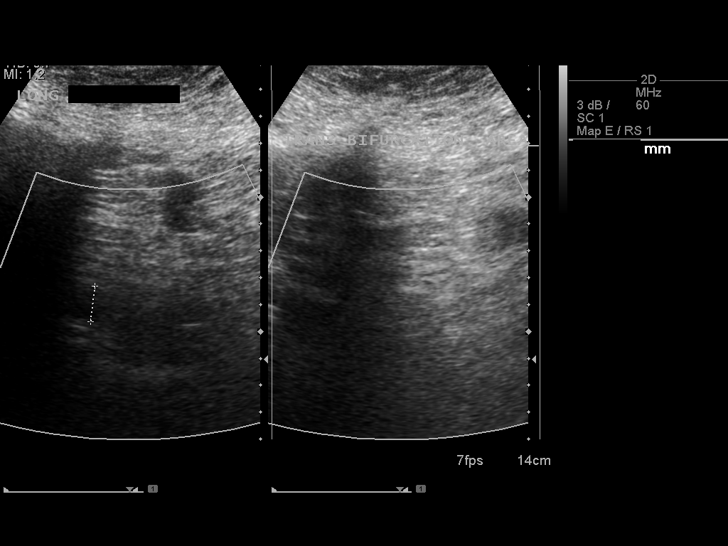
[im 12/72]
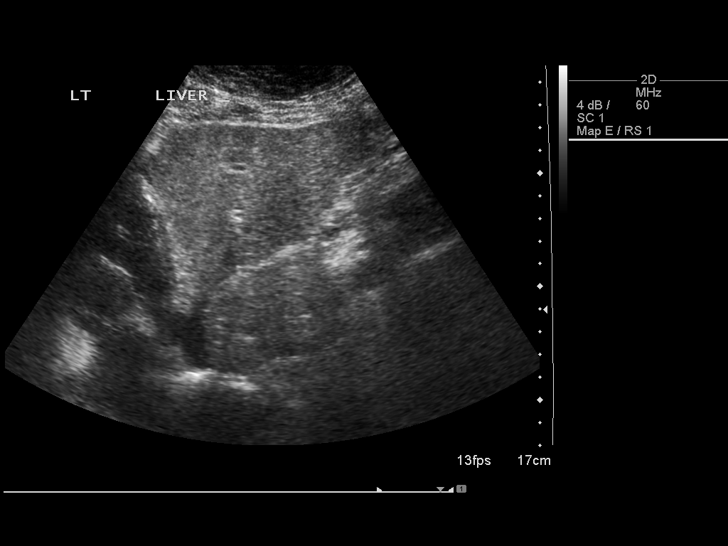
[im 18/72]
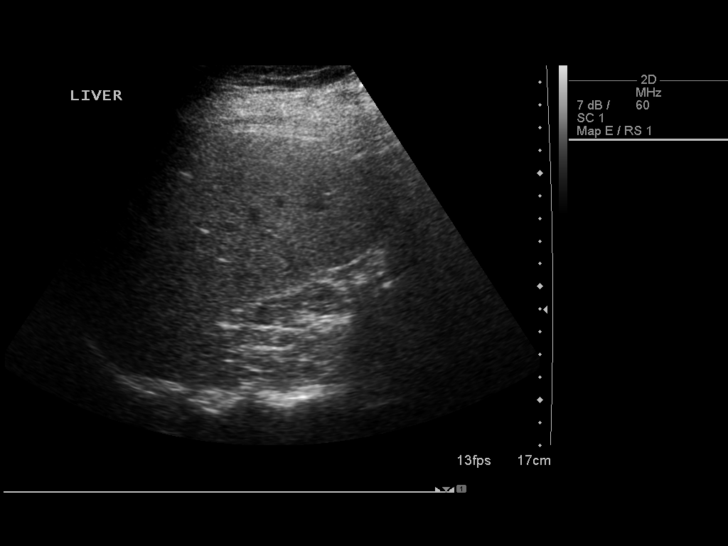
[im 24/72]
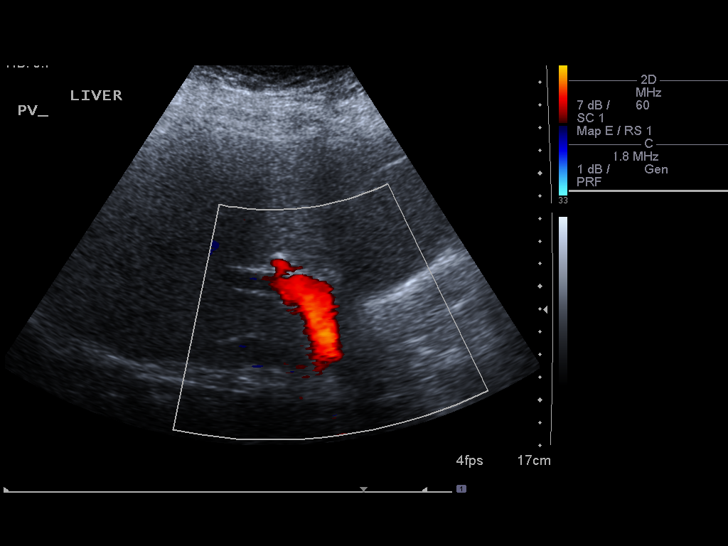
[im 27/72]
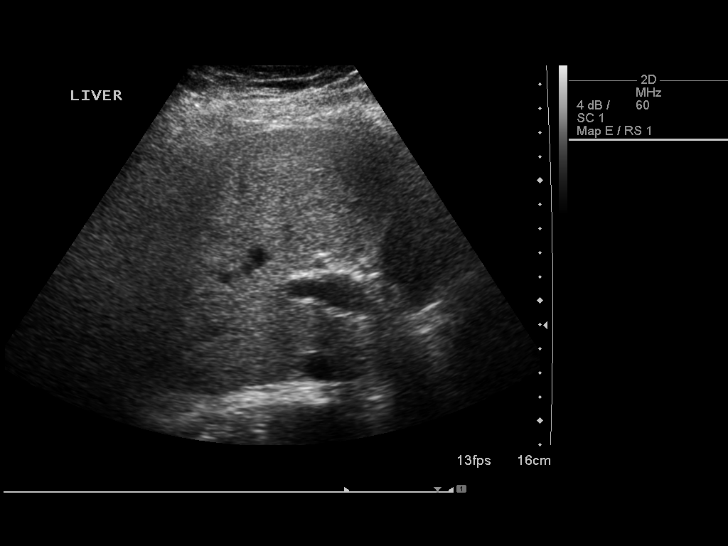
[im 33/72]
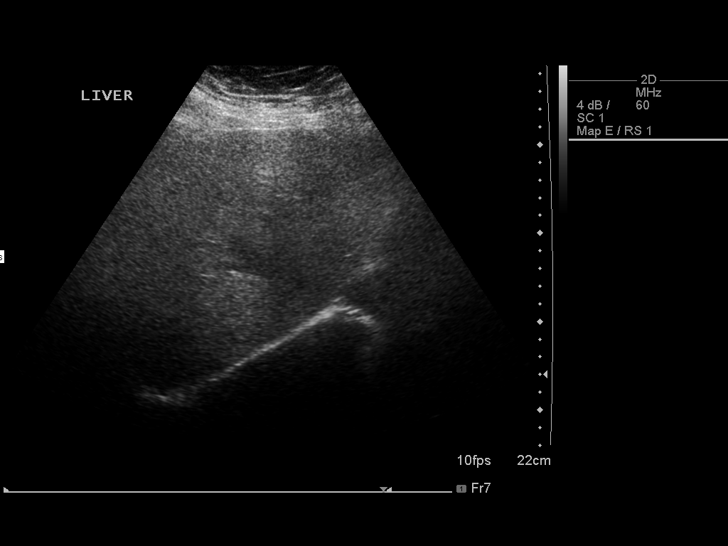
[im 39/72]
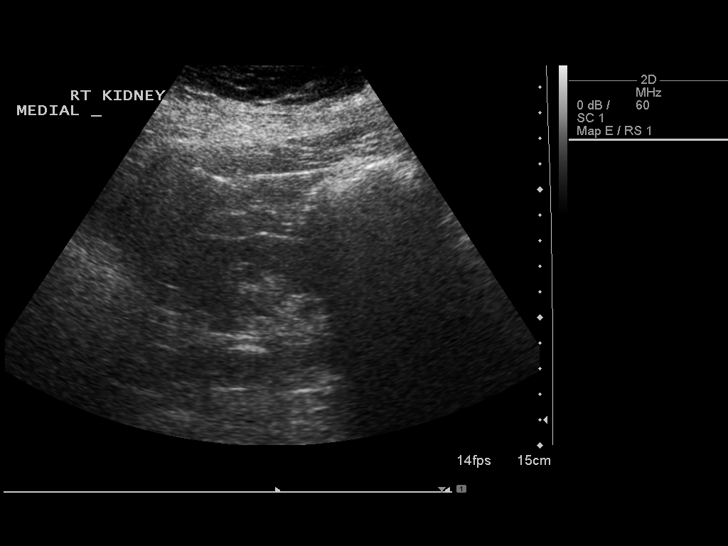
[im 45/72]
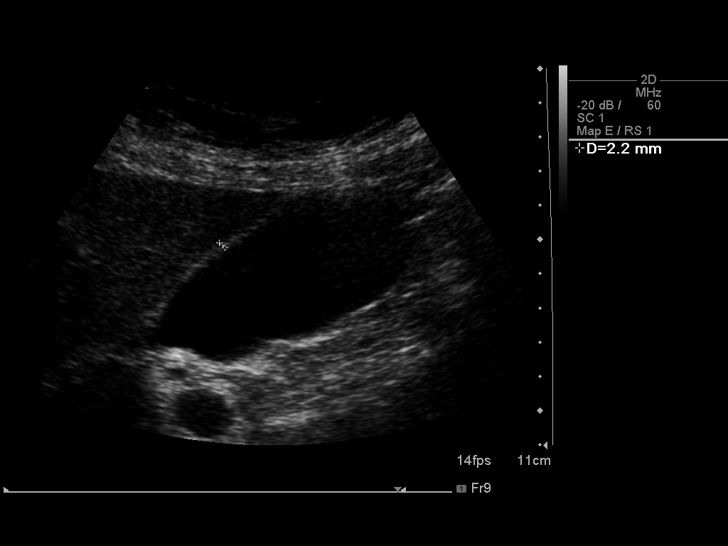
[im 48/72]
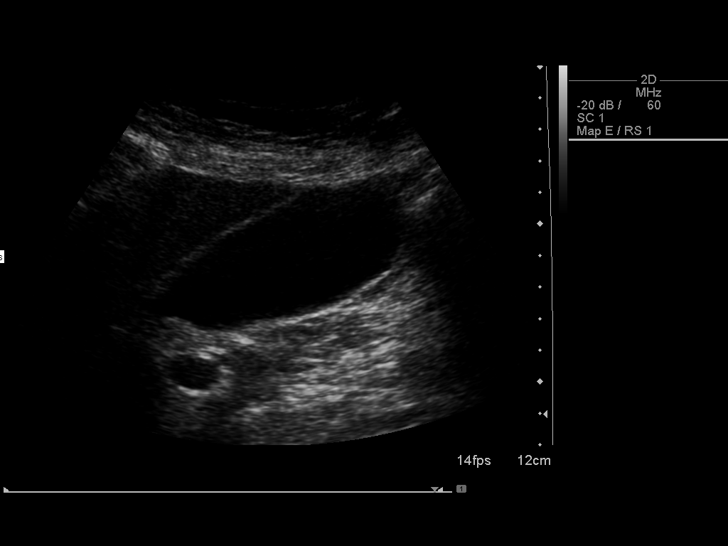
[im 54/72]
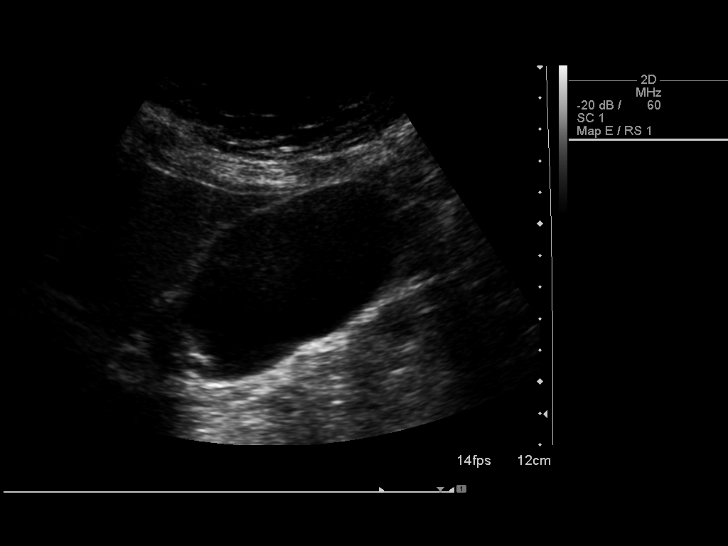
[im 60/72]
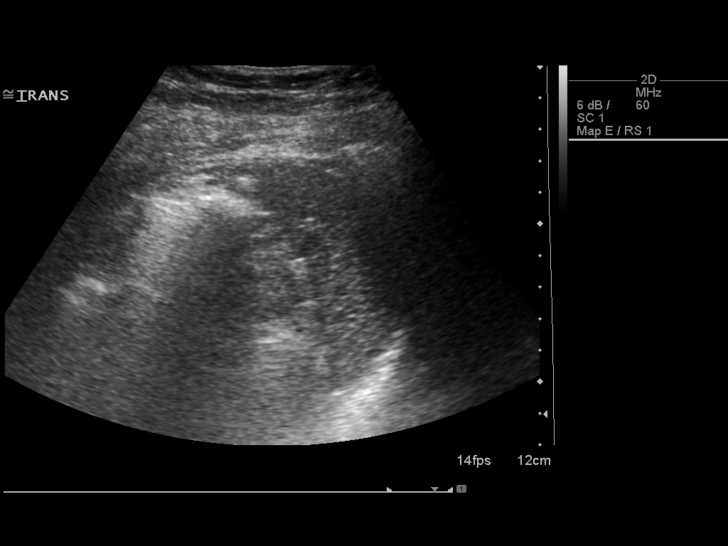
[im 66/72]
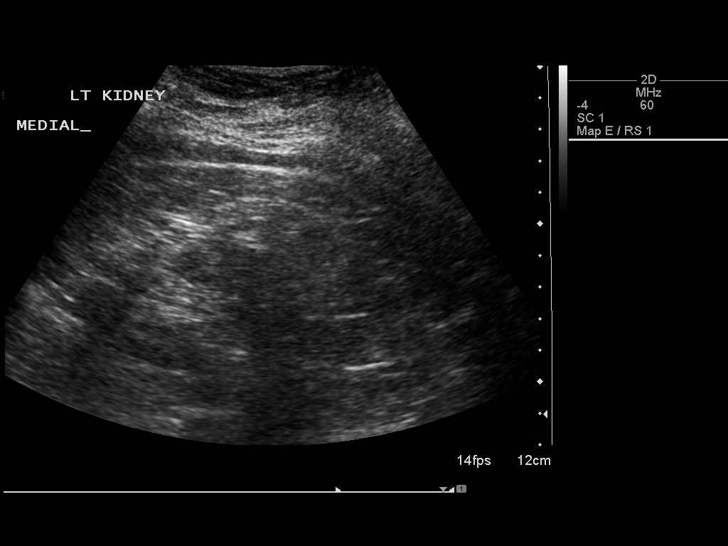
[im 72/72]
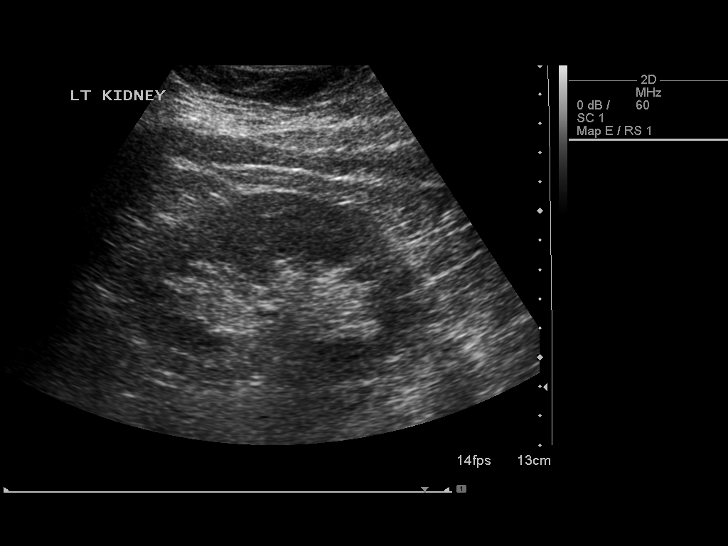

[14 of 25 positions shown; findings below may reference images not displayed]

FINDINGS: Gallbladder:

No gallstones, gallbladder wall thickening, or pericholecystic
fluid. Negative sonographic Murphy's sign.

Common bile duct:

Diameter: 5 mm.

Liver:

Hyperechoic hepatic parenchyma, suggesting hepatic steatosis. No
focal hepatic lesion is seen.

IVC:

Poorly visualized due to overlying bowel gas.

Pancreas:

Not visualized due to overlying bowel gas.

Spleen:

Measures 4.3 cm.

Right Kidney:

Length: 12.1 cm.  No mass or hydronephrosis.

Left Kidney:

Length: 11.0 cm.  No mass or hydronephrosis.

Abdominal aorta:

No aneurysm visualized.

Other findings:

None.
IMPRESSION: Suspected hepatic steatosis.

Otherwise negative abdominal ultrasound.

## 2015-01-27 IMAGING — CR DG CHEST 2V
2 series · 2 of 2 positions shown · non-contrast
Comparison: PA and lateral chest x-ray November 04, 2011.

CLINICAL DATA: Chest pain and cough with history of hypertension
and previous tobacco use

EXAM:
CHEST  2 VIEW

[w chest pa]
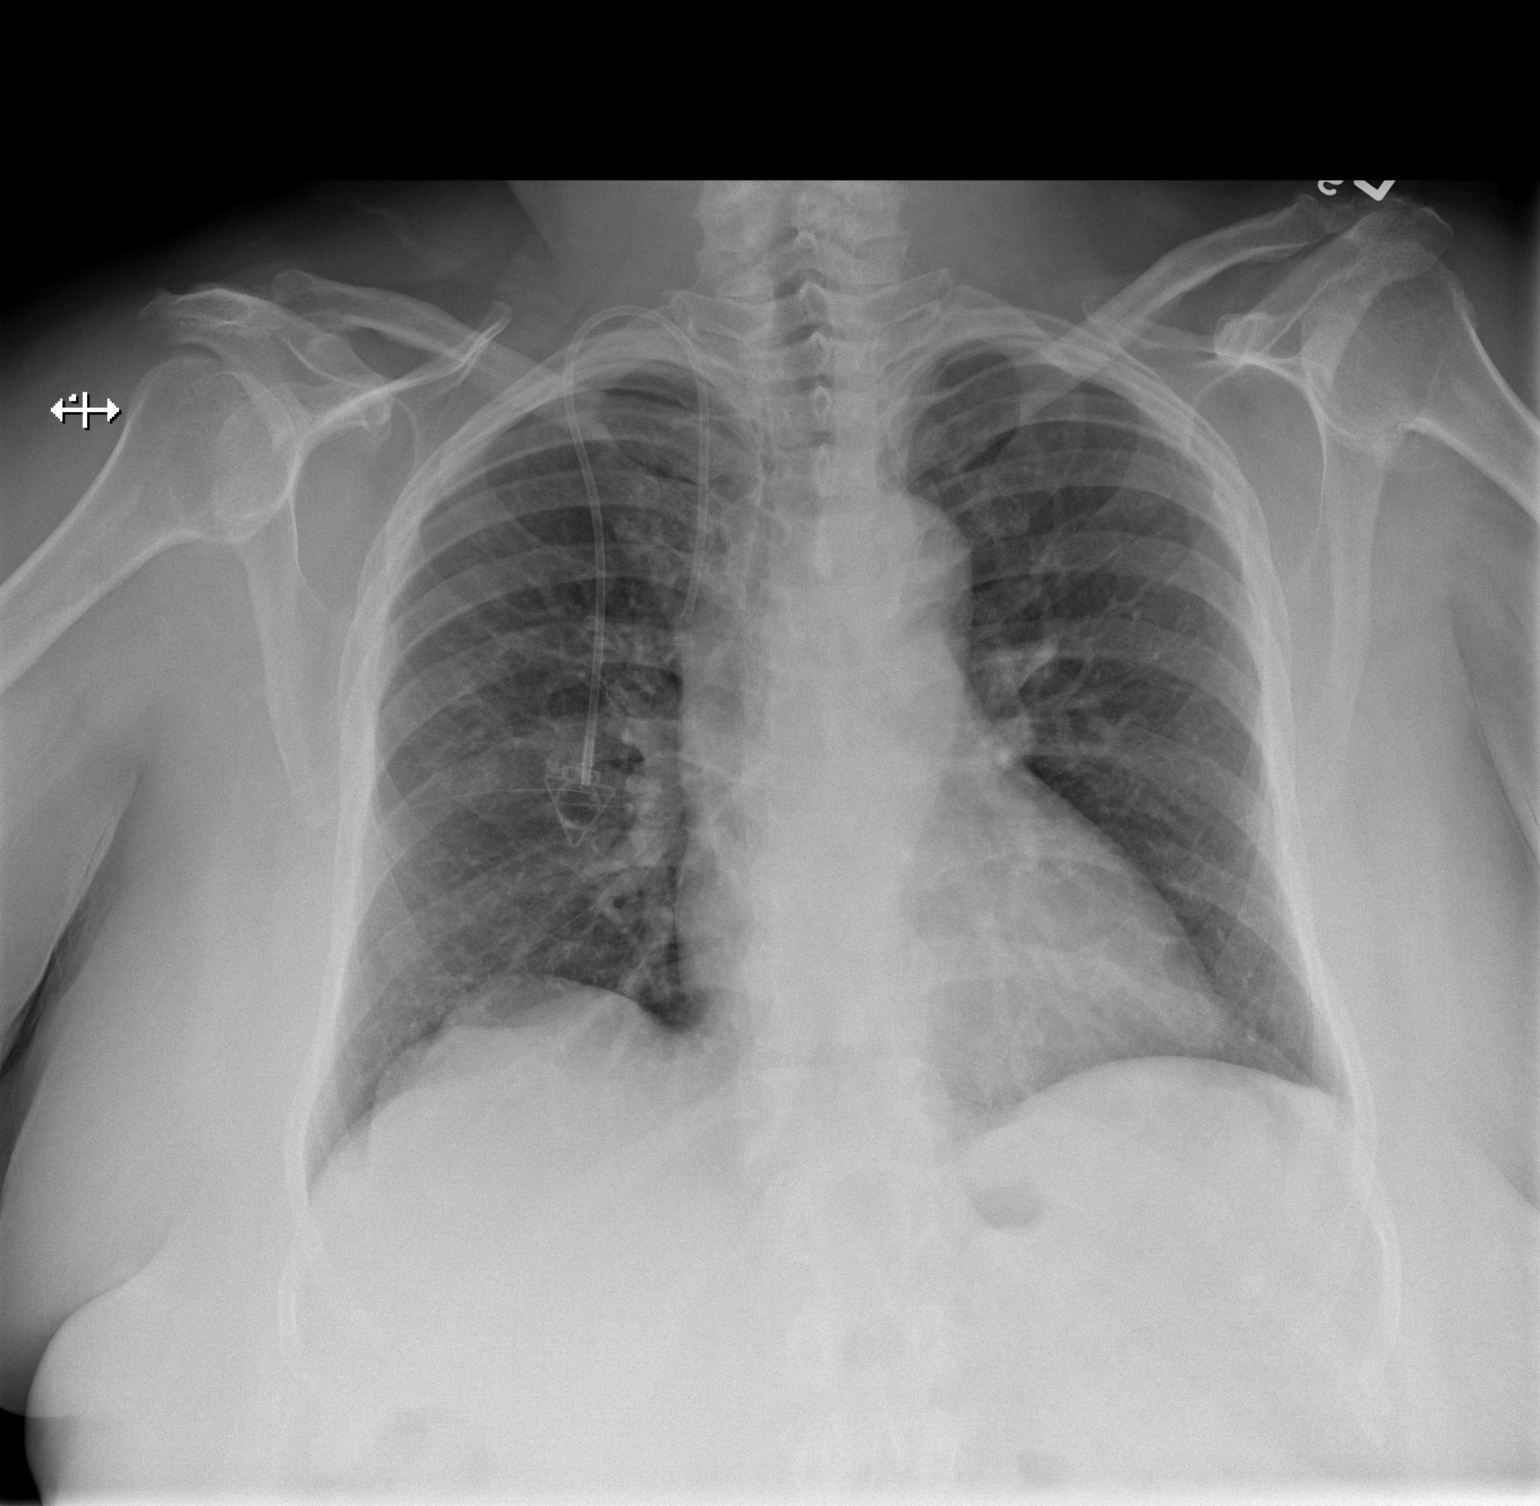

[w chest lat]
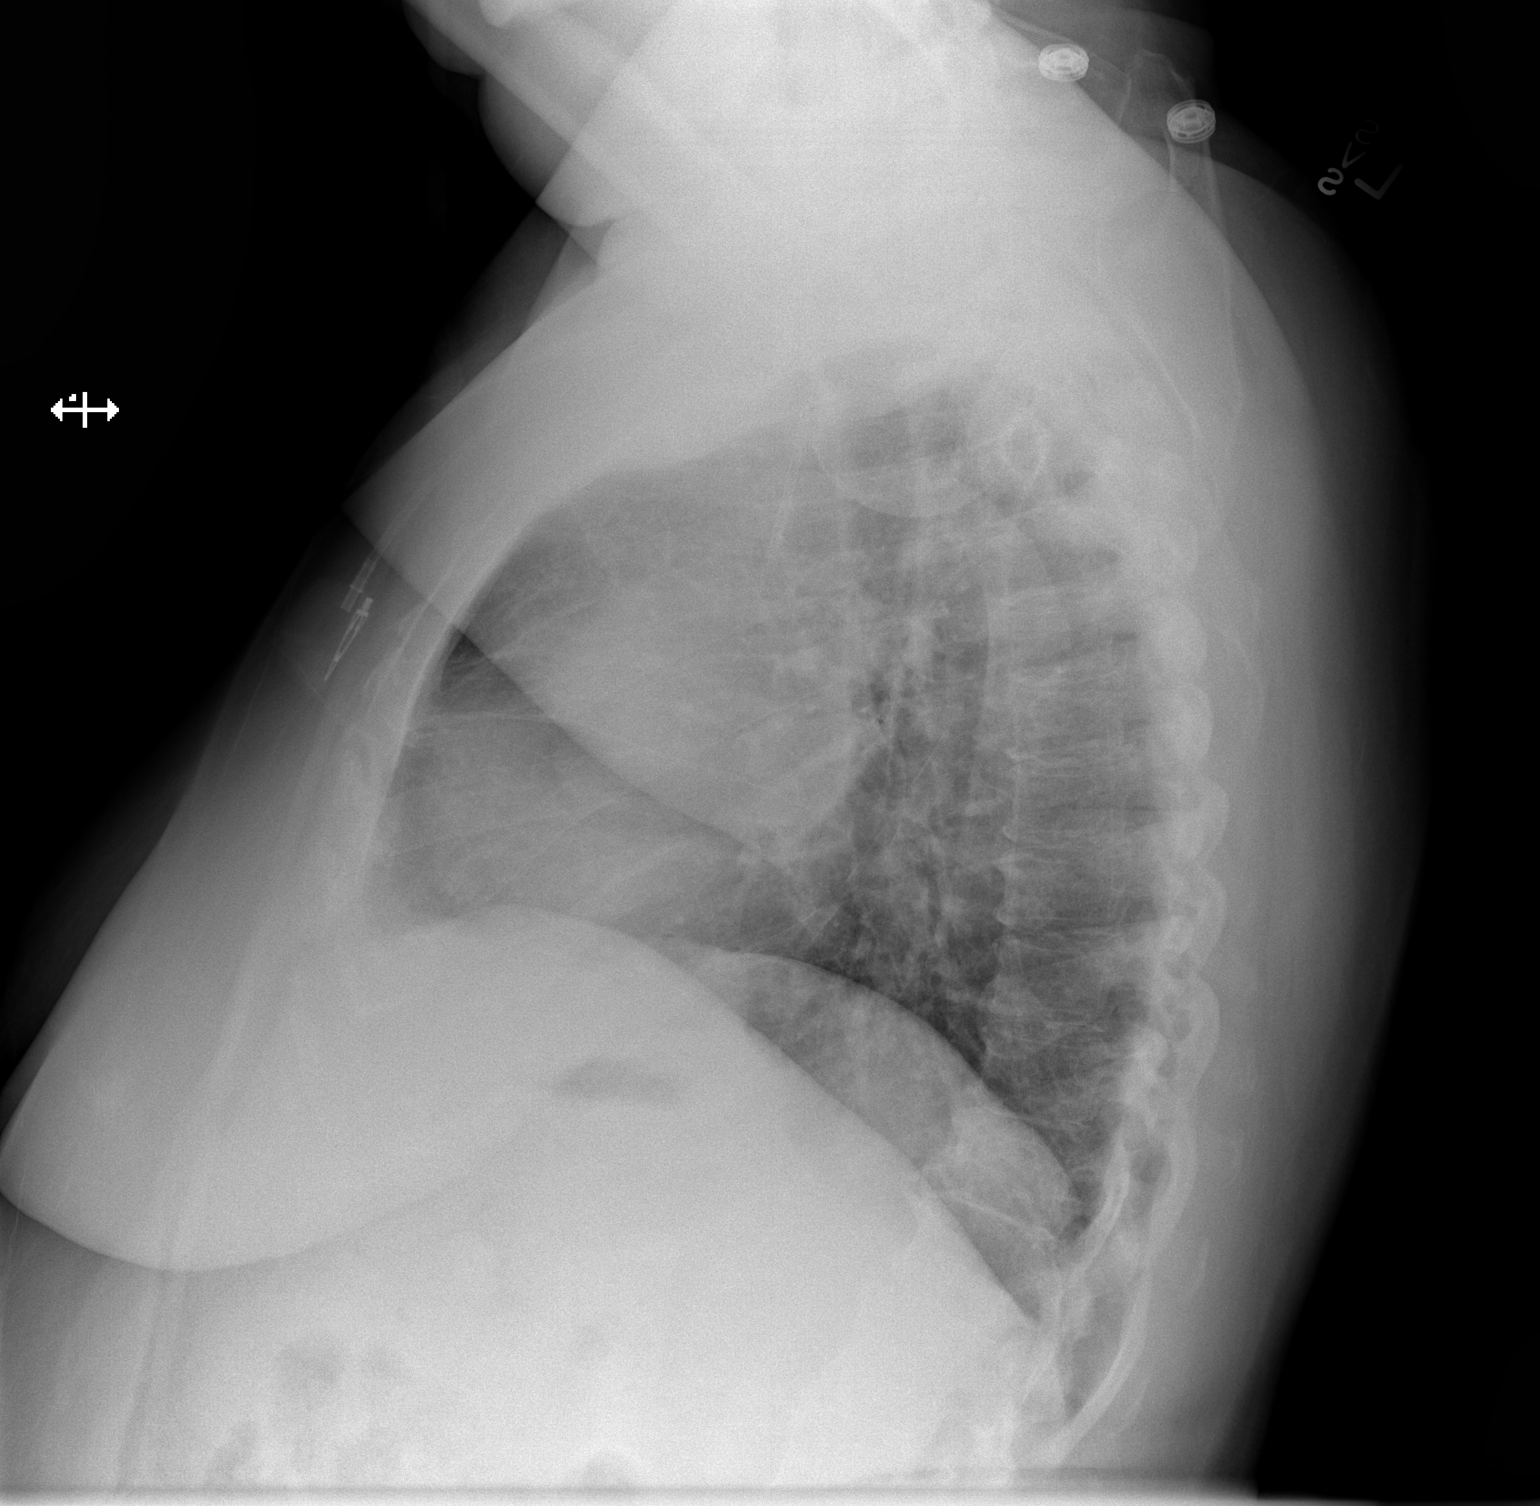

[2 of 2 positions shown; findings below may reference images not displayed]

FINDINGS: The lungs are adequately inflated. There is no focal infiltrate.
There is no pleural effusion or pneumothorax or pneumomediastinum.
The cardiac silhouette is top-normal in size. The pulmonary
vascularity is not engorged. There is tortuosity of the descending
thoracic aorta. A Port-A-Cath appliance is in place. The observed
portions of the bony thorax appear normal.
IMPRESSION: There is no evidence of pneumonia nor CHF or other acute
cardiopulmonary disease. The Port-A-Cath appliance appears unchanged
in appearance.

## 2015-01-28 ENCOUNTER — Telehealth: Payer: Self-pay | Admitting: Hematology & Oncology

## 2015-01-28 NOTE — Telephone Encounter (Signed)
Faxed medical records to:   [facility] Attn: Chart Retrieval Outreach ID: [id number] Site ID: [id number] [place]  [address] F: [phone] P: [phone] Sidonie Dickens   Chart: [id number]

## 2015-02-03 ENCOUNTER — Ambulatory Visit: Payer: Medicare HMO | Admitting: Podiatry

## 2015-02-04 ENCOUNTER — Ambulatory Visit (INDEPENDENT_AMBULATORY_CARE_PROVIDER_SITE_OTHER): Payer: Medicare HMO | Admitting: Podiatry

## 2015-02-04 ENCOUNTER — Encounter: Payer: Self-pay | Admitting: Podiatry

## 2015-02-04 VITALS — BP 125/90 | HR 92

## 2015-02-04 DIAGNOSIS — M79606 Pain in leg, unspecified: Secondary | ICD-10-CM

## 2015-02-04 DIAGNOSIS — M79673 Pain in unspecified foot: Secondary | ICD-10-CM | POA: Diagnosis not present

## 2015-02-04 DIAGNOSIS — B351 Tinea unguium: Secondary | ICD-10-CM | POA: Diagnosis not present

## 2015-02-04 NOTE — Progress Notes (Signed)
Subjective: 64 year old female presents requesting her nails cut. Stated that her blood sugar runs between 110-125. Been diabetic 15 years.  Has numbness on feet and hands with more pain and cramp at night. Taking Neurontin, which is helping. Also take Vicodin if pain is excess.  Has had chemo and radiation therapy for stomach cancer 4 years ago.   Objective: Dermatologic: Thick discolored and deformed nail 2nd left. Hypertrophic nails x 10. Vascular: All pedal pulses are palpable. No edema or erythema noted. Neurologic: Positive response to Monofilament sensory testing on both feet. Orthopedic: Severe contracted 2nd digit with plantar flexed distal end and deformed nail left.   Assessment: Onychomycosis x 10. Diabetic neuropathy. Painful feet. 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  Plan: Reviewed findings. All nails debrided.

## 2015-02-04 NOTE — Patient Instructions (Signed)
Seen for hypertrophic nails. All nails debrided. Return in 3 months or as needed.  

## 2015-02-13 ENCOUNTER — Ambulatory Visit: Payer: Self-pay | Admitting: Family Medicine

## 2015-02-17 ENCOUNTER — Telehealth: Payer: Self-pay | Admitting: Hematology & Oncology

## 2015-02-17 ENCOUNTER — Ambulatory Visit (INDEPENDENT_AMBULATORY_CARE_PROVIDER_SITE_OTHER): Payer: Medicare HMO | Admitting: Family Medicine

## 2015-02-17 ENCOUNTER — Encounter: Payer: Self-pay | Admitting: Family Medicine

## 2015-02-17 VITALS — BP 112/70 | HR 95 | Temp 99.9°F | Resp 18 | Ht 66.0 in | Wt 256.8 lb

## 2015-02-17 DIAGNOSIS — S80861A Insect bite (nonvenomous), right lower leg, initial encounter: Secondary | ICD-10-CM | POA: Diagnosis not present

## 2015-02-17 DIAGNOSIS — F329 Major depressive disorder, single episode, unspecified: Secondary | ICD-10-CM

## 2015-02-17 DIAGNOSIS — Z23 Encounter for immunization: Secondary | ICD-10-CM

## 2015-02-17 DIAGNOSIS — L089 Local infection of the skin and subcutaneous tissue, unspecified: Secondary | ICD-10-CM | POA: Diagnosis not present

## 2015-02-17 DIAGNOSIS — E1142 Type 2 diabetes mellitus with diabetic polyneuropathy: Secondary | ICD-10-CM

## 2015-02-17 DIAGNOSIS — F32A Depression, unspecified: Secondary | ICD-10-CM

## 2015-02-17 DIAGNOSIS — W57XXXA Bitten or stung by nonvenomous insect and other nonvenomous arthropods, initial encounter: Secondary | ICD-10-CM

## 2015-02-17 MED ORDER — METFORMIN HCL 850 MG PO TABS: 850.0000 mg | ORAL_TABLET | Freq: Two times a day (BID) | ORAL | Status: DC

## 2015-02-17 MED ORDER — ERYTHROMYCIN BASE 500 MG PO TABS: 500.0000 mg | ORAL_TABLET | Freq: Two times a day (BID) | ORAL | Status: DC

## 2015-02-17 MED ORDER — HYDROCODONE-ACETAMINOPHEN 5-325 MG PO TABS: 1.0000 | ORAL_TABLET | Freq: Four times a day (QID) | ORAL | Status: DC | PRN

## 2015-02-17 MED ORDER — DULOXETINE HCL 60 MG PO CPEP: 60.0000 mg | ORAL_CAPSULE | Freq: Every day | ORAL | Status: DC

## 2015-02-17 NOTE — Progress Notes (Signed)
Pre visit review using our clinic review tool, if applicable. No additional management support is needed unless otherwise documented below in the visit note. 

## 2015-02-17 NOTE — Telephone Encounter (Signed)
Faxed medical records to [facility] on behalf of [facility].  Outreach ID: [id number] Site ID: [id number]  Chart ID: [id number]  P: [phone] Sidonie Dickens F: [phone]     COPY SCANNED

## 2015-02-17 NOTE — Patient Instructions (Addendum)
Diabetic Neuropathy Diabetic neuropathy is a nerve disease or nerve damage that is caused by diabetes mellitus. About half of all people with diabetes mellitus have some form of nerve damage. Nerve damage is more common in those who have had diabetes mellitus for many years and who generally have not had good control of their blood sugar (glucose) level. Diabetic neuropathy is a common complication of diabetes mellitus. There are three more common types of diabetic neuropathy and a fourth type that is less common and less understood:   Peripheral neuropathy--This is the most common type of diabetic neuropathy. It causes damage to the nerves of the feet and legs first and then eventually the hands and arms.The damage affects the ability to sense touch.  Autonomic neuropathy--This type causes damage to the autonomic nervous system, which controls the following functions:  Heartbeat.  Body temperature.  Blood pressure.  Urination.  Digestion.  Sweating.  Sexual function.  Focal neuropathy--Focal neuropathy can be painful and unpredictable and occurs most often in older adults with diabetes mellitus. It involves a specific nerve or one area and often comes on suddenly. It usually does not cause long-term problems.  Radiculoplexus neuropathy-- Sometimes called lumbosacral radiculoplexus neuropathy, radiculoplexus neuropathy affects the nerves of the thighs, hips, buttocks, or legs. It is more common in people with type 2 diabetes mellitus and in older men. It is characterized by debilitating pain, weakness, and atrophy, usually in the thigh muscles. CAUSES  The cause of peripheral, autonomic, and focal neuropathies is diabetes mellitus that is uncontrolled and high glucose levels. The cause of radiculoplexus neuropathy is unknown. However, it is thought to be caused by inflammation related to uncontrolled glucose levels. SIGNS AND SYMPTOMS  Peripheral Neuropathy Peripheral neuropathy develops  slowly over time. When the nerves of the feet and legs no longer work there may be:   Burning, stabbing, or aching pain in the legs or feet.  Inability to feel pressure or pain in your feet. This can lead to:  Thick calluses over pressure areas.  Pressure sores.  Ulcers.  Foot deformities.  Reduced ability to feel temperature changes.  Muscle weakness. Autonomic Neuropathy The symptoms of autonomic neuropathy vary depending on which nerves are affected. Symptoms may include:  Problems with digestion, such as:  Feeling sick to your stomach (nausea).  Vomiting.  Bloating.  Constipation.  Diarrhea.  Abdominal pain.  Difficulty with urination. This occurs if you lose your ability to sense when your bladder is full. Problems include:  Urine leakage (incontinence).  Inability to empty your bladder completely (retention).  Rapid or irregular heartbeat (palpitations).  Blood pressure drops when you stand up (orthostatic hypotension). When you stand up you may feel:  Dizzy.  Weak.  Faint.  In men, inability to attain and maintain an erection.  In women, vaginal dryness and problems with decreased sexual desire and arousal.  Problems with body temperature regulation.  Increased or decreased sweating. Focal Neuropathy  Abnormal eye movements or abnormal alignment of both eyes.  Weakness in the wrist.  Foot drop. This results in an inability to lift the foot properly and abnormal walking or foot movement.  Paralysis on one side of your face (Bell palsy).  Chest or abdominal pain. Radiculoplexus Neuropathy  Sudden, severe pain in your hip, thigh, or buttocks.  Weakness and wasting of thigh muscles.  Difficulty rising from a seated position.  Abdominal swelling.  Unexplained weight loss (usually more than 10 lb [4.5 kg]). DIAGNOSIS  Peripheral Neuropathy Your senses may  be tested. Sensory function testing can be done with:  A light touch using a  monofilament.  A vibration with tuning fork.  A sharp sensation with a pin prick. Other tests that can help diagnose neuropathy are:  Nerve conduction velocity. This test checks the transmission of an electrical current through a nerve.  Electromyography. This shows how muscles respond to electrical signals transmitted by nearby nerves.  Quantitative sensory testing. This is used to assess how your nerves respond to vibrations and changes in temperature. Autonomic Neuropathy Diagnosis is often based on reported symptoms. Tell your health care provider if you experience:   Dizziness.   Constipation.   Diarrhea.   Inappropriate urination or inability to urinate.   Inability to get or maintain an erection.  Tests that may be done include:   Electrocardiography or Holter monitor. These are tests that can help show problems with the heart rate or heart rhythm.   An X-ray exam may be done. Focal Neuropathy Diagnosis is made based on your symptoms and what your health care provider finds during your exam. Other tests may be done. They may include:  Nerve conduction velocities. This checks the transmission of electrical current through a nerve.  Electromyography. This shows how muscles respond to electrical signals transmitted by nearby nerves.  Quantitative sensory testing. This test is used to assess how your nerves respond to vibration and changes in temperature. Radiculoplexus Neuropathy  Often the first thing is to eliminate any other issue or problems that might be the cause, as there is no stick test for diagnosis.  X-ray exam of your spine and lumbar region.  Spinal tap to rule out cancer.  MRI to rule out other lesions. TREATMENT  Once nerve damage occurs, it cannot be reversed. The goal of treatment is to keep the disease or nerve damage from getting worse and affecting more nerve fibers. Controlling your blood glucose level is the key. Most people with  radiculoplexus neuropathy see at least a partial improvement over time. You will need to keep your blood glucose and HbA1c levels in the target range determined by your health care provider. Things that help control blood glucose levels include:   Blood glucose monitoring.   Meal planning.   Physical activity.   Diabetes medicine.  Over time, maintaining lower blood glucose levels helps lessen symptoms. Sometimes, prescription pain medicine is needed. HOME CARE INSTRUCTIONS:  Do not smoke.  Keep your blood glucose level in the range that you and your health care provider have determined acceptable for you.  Keep your blood pressure level in the range that you and your health care provider have determined acceptable for you.  Eat a well-balanced diet.  Be active every day.  Check your feet every day. SEEK MEDICAL CARE IF:   You have burning, stabbing, or aching pain in the legs or feet.  You are unable to feel pressure or pain in your feet.  You develop problems with digestion such as:  Nausea.  Vomiting.  Bloating.  Constipation.  Diarrhea.  Abdominal pain.  You have difficulty with urination, such as:  Incontinence.  Retention.  You have palpitations.  You develop orthostatic hypotension. When you stand up you may feel:  Dizzy.  Weak.  Faint.  You cannot attain and maintain an erection (in men).  You have vaginal dryness and problems with decreased sexual desire and arousal (in women).  You have severe pain in your thighs, legs, or buttocks.  You have unexplained weight loss.  Document Released: 09/19/2001 Document Revised: 05/01/2013 Document Reviewed: 12/20/2012 [facility] Patient Information 2015 [place], [place]. This information is not intended to replace advice given to you by your health care provider. Make sure you discuss any questions you have with your health care provider.

## 2015-02-18 NOTE — Progress Notes (Signed)
Patient ID: [name], female    DOB: 1951/03/25  Age: 64 y.o. MRN: [id number]    Subjective:  Subjective HPI [name] presents for f/u dm neuropathy and depression.  Both have improved.  Pt is happy with the results.    Review of Systems  Constitutional: Negative for diaphoresis, appetite change, fatigue and unexpected weight change.  Eyes: Negative for pain, redness and visual disturbance.  Respiratory: Negative for cough, chest tightness, shortness of breath and wheezing.   Cardiovascular: Negative for chest pain, palpitations and leg swelling.  Endocrine: Negative for cold intolerance, heat intolerance, polydipsia, polyphagia and polyuria.  Genitourinary: Negative for dysuria, frequency and difficulty urinating.  Neurological: Negative for dizziness, light-headedness, numbness and headaches.    History Past Medical History  Diagnosis Date  . stomach ca dx'd 2010    chemo/xrt comp 05/2009  . Hypertension   . Diabetes mellitus   . Asthma   . Sleep apnea   . Degenerative joint disease   . Hyperlipidemia   . GERD (gastroesophageal reflux disease)     She has past surgical history that includes Total knee arthroplasty; Total hip arthroplasty; Shoulder surgery; Appendectomy; Cesarean section; Hernia repair; Colon surgery; and Esophagogastroduodenoscopy (N/A, 10/15/2012).   Her family history includes Alzheimer's disease in her father; Cancer in her maternal grandfather and mother; Diabetes in her brother and maternal grandmother. There is no history of Suicidality, Depression, Dementia, or Anxiety disorder.She reports that she quit smoking about 26 years ago. Her smoking use included Cigarettes. She started smoking about 41 years ago. She has a 15 pack-year smoking history. She has never used smokeless tobacco. She reports that she does not drink alcohol or use illicit drugs.  Current Outpatient Prescriptions on File Prior to Visit  Medication Sig Dispense Refill  .  carvedilol (COREG) 12.5 MG tablet Take 1 tablet (12.5 mg total) by mouth every morning. 90 tablet 1  . gabapentin (NEURONTIN) 300 MG capsule 2 po tid 180 capsule 3  . glimepiride (AMARYL) 4 MG tablet TAKE 1 TABLET (4 MG TOTAL) BY MOUTH 2 (TWO) TIMES DAILY. 90 tablet 1  . hydrochlorothiazide (HYDRODIURIL) 25 MG tablet Take 1 tablet (25 mg total) by mouth daily. 90 tablet 1  . lidocaine-prilocaine (EMLA) cream Apply 1 application topically as needed. 30 g 12  . montelukast (SINGULAIR) 10 MG tablet Take 1 tablet (10 mg total) by mouth at bedtime. 90 tablet 1  . Multiple Vitamin (MULTIVITAMIN WITH MINERALS) TABS tablet Take 1 tablet by mouth daily.    . NONFORMULARY OR COMPOUNDED ITEM Bmp, hgba1c, hep, lipid, microalbumin, ua----dx DM II uncontrolled with neuropathy and htn 1 each 0  . prochlorperazine (COMPAZINE) 10 MG tablet Take 1 tablet (10 mg total) by mouth every 6 (six) hours as needed for nausea or vomiting. 60 tablet 4  . senna-docusate (SENOKOT-S) 8.6-50 MG per tablet Take 2 tablets by mouth daily. 20 tablet 0  . tiZANidine (ZANAFLEX) 4 MG tablet Take 1 tablet (4 mg total) by mouth every 6 (six) hours as needed for muscle spasms. 90 tablet 2  . albuterol (PROVENTIL HFA;VENTOLIN HFA) 108 (90 BASE) MCG/ACT inhaler Inhale 2 puffs into lungs every 8 hours prn wheezing (Patient not taking: Reported on 02/17/2015) 1 Inhaler 0  . azelastine (ASTELIN) 0.1 % nasal spray Place 2 sprays into both nostrils at bedtime as needed for rhinitis. Use in each nostril as directed (Patient not taking: Reported on 02/17/2015) 30 mL 3  . azelastine (ASTELIN) 0.1 % nasal spray  Place 1 spray into both nostrils 2 (two) times daily. Use in each nostril as directed (Patient not taking: Reported on 02/17/2015) 30 mL 12   No current facility-administered medications on file prior to visit.     Objective:  Objective Physical Exam  Constitutional: She is oriented to person, place, and time. She appears well-developed and  well-nourished.  HENT:  Head: Normocephalic and atraumatic.  Eyes: Conjunctivae and EOM are normal.  Neck: Normal range of motion. Neck supple. No JVD present. Carotid bruit is not present. No thyromegaly present.  Cardiovascular: Normal rate, regular rhythm and normal heart sounds.   No murmur heard. Pulmonary/Chest: Effort normal and breath sounds normal. No respiratory distress. She has no wheezes. She has no rales. She exhibits no tenderness.  Musculoskeletal: She exhibits no edema.  Neurological: She is alert and oriented to person, place, and time.  Psychiatric: She has a normal mood and affect. Her behavior is normal.   BP 112/70 mmHg  Pulse 95  Temp(Src) 99.9 F (37.7 C) (Oral)  Resp 18  Ht 5' 6"  (1.676 m)  Wt 256 lb 12.8 oz (116.484 kg)  BMI 41.47 kg/m2  SpO2 97% Wt Readings from Last 3 Encounters:  02/17/15 256 lb 12.8 oz (116.484 kg)  01/16/15 259 lb (117.482 kg)  01/14/15 259 lb (117.482 kg)     Lab Results  Component Value Date   WBC 7.9 01/13/2015   HGB 12.2 01/13/2015   HCT 35.2 01/13/2015   PLT 368 01/13/2015   GLUCOSE 133 01/13/2015   CHOL 217* 05/26/2014   TRIG 152.0* 05/26/2014   HDL 46.50 05/26/2014   LDLCALC 140* 05/26/2014   ALT 17 01/13/2015   AST 16 01/13/2015   NA 141 01/13/2015   K 4.9 01/13/2015   CL 102 10/22/2014   CREATININE 1.2* 01/13/2015   BUN 18.7 01/13/2015   CO2 28 01/13/2015   TSH 2.435 07/31/2014   INR 0.98 11/05/2010   HGBA1C 8.6* 10/22/2014    Dg Lumbar Spine Complete  01/14/2015   CLINICAL DATA:  Lumbago following fall earlier today  EXAM: LUMBAR SPINE - COMPLETE 4+ VIEW  COMPARISON:  December 27, 2013  FINDINGS: Frontal, lateral, spot lumbosacral lateral, and bilateral oblique views were obtained. There are 5 non-rib-bearing lumbar type vertebral bodies. There is no fracture or spondylolisthesis. There is stable disc space narrowing at T11-12. There is no appreciable lumbar disc space narrowing; lumbar discs appear stable.  There is facet osteoarthritic change at L3-4, L4-5, and L5-S1 on the left at L4-5 and L5-S1 on the right, stable.  IMPRESSION: Areas of osteoarthritic change, essentially stable. No acute fracture or spondylolisthesis.   Electronically Signed   By: Lowella Grip III M.D.   On: 01/14/2015 16:05   Dg Knee Complete 4 Views Left  01/14/2015   CLINICAL DATA:  Fall  EXAM: LEFT KNEE - COMPLETE 4+ VIEW  COMPARISON:  07/08/2013  FINDINGS: Total knee replacement in satisfactory position and alignment. No loosening  Negative for fracture  IMPRESSION: Negative for fracture.   Electronically Signed   By: Franchot Gallo M.D.   On: 01/14/2015 16:23     Assessment & Plan:  Plan I have discontinued Ms. Minshall's levofloxacin and DULoxetine. I am also having her start on DULoxetine and erythromycin base. Additionally, I am having her maintain her multivitamin with minerals, senna-docusate, lidocaine-prilocaine, prochlorperazine, albuterol, montelukast, tiZANidine, carvedilol, hydrochlorothiazide, glimepiride, azelastine, azelastine, NONFORMULARY OR COMPOUNDED ITEM, gabapentin, HYDROcodone-acetaminophen, and metFORMIN.  Meds ordered this encounter  Medications  .  HYDROcodone-acetaminophen (NORCO/VICODIN) 5-325 MG per tablet    Sig: Take 1 tablet by mouth every 6 (six) hours as needed for moderate pain.    Dispense:  30 tablet    Refill:  0  . metFORMIN (GLUCOPHAGE) 850 MG tablet    Sig: Take 1 tablet (850 mg total) by mouth 2 (two) times daily.    Dispense:  60 tablet    Refill:  3  . DULoxetine (CYMBALTA) 60 MG capsule    Sig: Take 1 capsule (60 mg total) by mouth daily.    Dispense:  90 capsule    Refill:  3  . erythromycin base (E-MYCIN) 500 MG tablet    Sig: Take 1 tablet (500 mg total) by mouth 2 (two) times daily.    Dispense:  20 tablet    Refill:  0    Problem List Items Addressed This Visit      Unprioritized   Diabetic polyneuropathy associated with type 2 diabetes mellitus - Primary    Relevant Medications   HYDROcodone-acetaminophen (NORCO/VICODIN) 5-325 MG per tablet   metFORMIN (GLUCOPHAGE) 850 MG tablet   DULoxetine (CYMBALTA) 60 MG capsule    Other Visit Diagnoses    Depression        Relevant Medications    DULoxetine (CYMBALTA) 60 MG capsule    Infected insect bite of leg, right, initial encounter        Relevant Medications    erythromycin base (E-MYCIN) 500 MG tablet    Other Relevant Orders    Tdap vaccine greater than or equal to 7yo IM (Completed)    Need for diphtheria, tetanus, acellular pertussis, poliovirus and Haemophilus influenzae vaccine        Relevant Orders    Tdap vaccine greater than or equal to 7yo IM (Completed)       Follow-up: Return in about 3 months (around 05/20/2015), or if symptoms worsen or fail to improve, for hypertension, hyperlipidemia, diabetes II, fasting.  Garnet Koyanagi, DO

## 2015-03-11 ENCOUNTER — Ambulatory Visit: Payer: Self-pay | Admitting: Neurology

## 2015-03-16 ENCOUNTER — Other Ambulatory Visit: Payer: Self-pay | Admitting: Family Medicine

## 2015-03-16 DIAGNOSIS — E1142 Type 2 diabetes mellitus with diabetic polyneuropathy: Secondary | ICD-10-CM

## 2015-03-16 MED ORDER — HYDROCODONE-ACETAMINOPHEN 5-325 MG PO TABS: 1.0000 | ORAL_TABLET | Freq: Four times a day (QID) | ORAL | Status: DC | PRN

## 2015-03-16 NOTE — Telephone Encounter (Signed)
Refill x1 

## 2015-03-16 NOTE — Telephone Encounter (Signed)
Called patient and she denies any swelling in hands or feet.  She complains of neuropathic pain that has been ongoing issue.  She is still taking the gabapentin and requests refill of Vicodin:  LOV: 02/17/15 Last refill: 02/17/15 for 30 with 0 CSC- 01/16/15, no UDS found  Please advise.

## 2015-03-16 NOTE — Telephone Encounter (Signed)
VM left advising the Rx is ready for pick up.     KP

## 2015-03-16 NOTE — Addendum Note (Signed)
Addended by: Ewing Schlein on: 03/16/2015 05:28 PM   Modules accepted: Orders

## 2015-03-16 NOTE — Telephone Encounter (Signed)
°  Relation to OR:TQSY Call back number:[phone] or [phone] Pharmacy:  Reason for call: pt  Would like for you to give her a call states she has questions regarding her hands and feet swelling , and she feels she need to change her medication

## 2015-03-16 NOTE — Telephone Encounter (Signed)
Please Triage this patient for Edema.     KP

## 2015-03-17 ENCOUNTER — Ambulatory Visit (INDEPENDENT_AMBULATORY_CARE_PROVIDER_SITE_OTHER): Payer: Medicare HMO

## 2015-03-17 VITALS — BP 124/80 | HR 78 | Temp 98.6°F | Ht 67.0 in | Wt 258.2 lb

## 2015-03-17 DIAGNOSIS — Z0000 Encounter for general adult medical examination without abnormal findings: Secondary | ICD-10-CM | POA: Diagnosis not present

## 2015-03-17 NOTE — Progress Notes (Signed)
Subjective:   [name] is a 64 y.o. female who presents for Medicare Annual (Subsequent) preventive examination.  Review of Systems:   Sleep patterns:   Sleeps 10 hours per night/gets up twice during the night Home Safety/Smoke Alarms:  Lives on the first floor of an apt with family.  Feels safe.  Smoke detectors present.   Firearm Safety:  No firearms.   Seat Belt Safety/Bike Helmet:  Always wears seat belt.    Counseling:   Eye Exam- 07/2014 per patient.   Dental-  Pt has a strong fear of the dentist and therefore does not go.  Female:  Pap-Dr. Cheri Fowler years ago    Mammo-UTD       Dexa scan- Last exam 2006     CCS- Pt reports within the last 2 years.  States she received one while in the hospital.       Objective:   Vitals: BP 124/80 mmHg  Pulse 78  Temp(Src) 98.6 F (37 C) (Oral)  Ht 5' 7"  (1.702 m)  Wt 258 lb 3.2 oz (117.119 kg)  BMI 40.43 kg/m2  SpO2   Tobacco History  Smoking status  . Former Smoker -- 1.00 packs/day for 15 years  . Types: Cigarettes  . Start date: 09/27/1973  . Quit date: 10/15/1988  Smokeless tobacco  . Never Used    Comment: quit smoking 14 years ago     Counseling given: Yes   Past Medical History  Diagnosis Date  . stomach ca dx'd 2010    chemo/xrt comp 05/2009  . Hypertension   . Diabetes mellitus   . Asthma   . Sleep apnea   . Degenerative joint disease   . Hyperlipidemia   . GERD (gastroesophageal reflux disease)   . Neuropathy     bilateral hands and feet   Past Surgical History  Procedure Laterality Date  . Total knee arthroplasty      Left  . Total hip arthroplasty      Right  . Shoulder surgery      Left  . Appendectomy    . Cesarean section    . Hernia repair    . Colon surgery    . Esophagogastroduodenoscopy N/A 10/15/2012    Procedure: ESOPHAGOGASTRODUODENOSCOPY (EGD);  Surgeon: Lear Ng, MD;  Location: [facility];  Service: Endoscopy;  Laterality: N/A;   Family History  Problem  Relation Age of Onset  . Cancer Mother   . Alzheimer's disease Father   . Diabetes Brother   . Diabetes Maternal Grandmother   . Cancer Maternal Grandfather     lung  . Suicidality Neg Hx   . Depression Neg Hx   . Dementia Neg Hx   . Anxiety disorder Neg Hx    History  Sexual Activity  . Sexual Activity: No    Outpatient Encounter Prescriptions as of 03/17/2015  Medication Sig  . aspirin 81 MG tablet Take 81 mg by mouth at bedtime.  . carvedilol (COREG) 12.5 MG tablet Take 1 tablet (12.5 mg total) by mouth every morning.  . DULoxetine (CYMBALTA) 60 MG capsule Take 1 capsule (60 mg total) by mouth daily.  Marland Kitchen gabapentin (NEURONTIN) 300 MG capsule 2 po tid  . glimepiride (AMARYL) 4 MG tablet TAKE 1 TABLET (4 MG TOTAL) BY MOUTH 2 (TWO) TIMES DAILY.  . hydrochlorothiazide (HYDRODIURIL) 25 MG tablet Take 1 tablet (25 mg total) by mouth daily.  Marland Kitchen HYDROcodone-acetaminophen (NORCO/VICODIN) 5-325 MG per tablet Take 1 tablet by mouth every  6 (six) hours as needed for moderate pain.  Marland Kitchen lidocaine-prilocaine (EMLA) cream Apply 1 application topically as needed.  . metFORMIN (GLUCOPHAGE) 850 MG tablet Take 1 tablet (850 mg total) by mouth 2 (two) times daily.  . montelukast (SINGULAIR) 10 MG tablet Take 1 tablet (10 mg total) by mouth at bedtime.  . Multiple Vitamin (MULTIVITAMIN WITH MINERALS) TABS tablet Take 1 tablet by mouth daily.  . NONFORMULARY OR COMPOUNDED ITEM Bmp, hgba1c, hep, lipid, microalbumin, ua----dx DM II uncontrolled with neuropathy and htn  . tiZANidine (ZANAFLEX) 4 MG tablet Take 1 tablet (4 mg total) by mouth every 6 (six) hours as needed for muscle spasms.  Marland Kitchen albuterol (PROVENTIL HFA;VENTOLIN HFA) 108 (90 BASE) MCG/ACT inhaler Inhale 2 puffs into lungs every 8 hours prn wheezing (Patient not taking: Reported on 02/17/2015)  . azelastine (ASTELIN) 0.1 % nasal spray Place 2 sprays into both nostrils at bedtime as needed for rhinitis. Use in each nostril as directed (Patient not  taking: Reported on 02/17/2015)  . azelastine (ASTELIN) 0.1 % nasal spray Place 1 spray into both nostrils 2 (two) times daily. Use in each nostril as directed (Patient not taking: Reported on 02/17/2015)  . prochlorperazine (COMPAZINE) 10 MG tablet Take 1 tablet (10 mg total) by mouth every 6 (six) hours as needed for nausea or vomiting. (Patient not taking: Reported on 03/17/2015)  . senna-docusate (SENOKOT-S) 8.6-50 MG per tablet Take 2 tablets by mouth daily. (Patient not taking: Reported on 03/17/2015)  . [DISCONTINUED] erythromycin base (E-MYCIN) 500 MG tablet Take 1 tablet (500 mg total) by mouth 2 (two) times daily. (Patient not taking: Reported on 03/17/2015)   No facility-administered encounter medications on file as of 03/17/2015.    Activities of Daily Living In your present state of health, do you have any difficulty performing the following activities: 03/17/2015 01/13/2015  Hearing? N N  Vision? N N  Difficulty concentrating or making decisions? N N  Walking or climbing stairs? Y N  Dressing or bathing? N N  Doing errands, shopping? N N  Preparing Food and eating ? N -  Using the Toilet? N -  In the past six months, have you accidently leaked urine? N -  Do you have problems with loss of bowel control? N -  Managing your Medications? N -  Managing your Finances? N -  Housekeeping or managing your Housekeeping? N -    Patient Care Team: Rosalita Chessman, DO as PCP - General (Family Medicine) Philemon Kingdom, MD as Consulting Physician (Internal Medicine) Pieter Partridge, DO as Consulting Physician (Neurology) Wilford Corner, MD as Consulting Physician (Gastroenterology) Volanda Napoleon, MD as Consulting Physician (Oncology) Myeong Roxine Caddy, DPM as Consulting Physician (Podiatry)    Assessment:  Diabetes- On glimepiride and metformin.  States taking them as prescribed.  Last Hgb A1C: 8.6, Blood Glucose: 124.  Pt has not been consistently monitoring blood sugars.  Discussed  monitoring blood sugars at least daily before meal.  Glucometer given for patient use.  Following with Dr. Cruzita Lederer.    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  No sudden changes noted.  New Rx of hydrocodone due for pick up at front desk.  Pt advised to pick up script.   Hypertension- BP stable today.  Taking carvedilol and HCTZ as prescribed.   Hyperlipidemia- Not currently on lipid lowering agent.  Heart healthy diet and exercise encouraged.    Morbid Obesity- Pt encouraged to eat healthy meals and  snacks, rich in fruits, vegetables, whole grains and lean protein.  Advised to avoid/limit fried foods, processed meats, and simple carbs, and encouraged to monitor portion sizes.  Increased exercise also encouraged.  CVA- On daily ASA.  No residual symptoms noted.  Denies headache, one-sided weakness, slurred speech, confusion or changes in vision.   MDD- On Cymbalta.  Denies symptoms of depression.  No suicidal or homicidal ideations.    Exercise Activities and Dietary recommendations  BMI- 40.43 kg/m2  Current Exercise Habits:: The patient does not participate in regular exercise at present   Diet:   Breakfast- bowl of cereal; bacon and egg sandwich; does not eat a large breakfast most of the time.    Lunch- mostly skips lunch  Dinner-  Heaviest meal--- 3 pieces fish and fries, Smothered chicken, rice, and green beans.  Pt admits to eating a lot of fried foods and carbs.  She does stay hydrated by drinking "a lot of water."   Goals    . Increase physical activity    . Reduce fat intake       Fall Risk Fall Risk  03/17/2015 01/13/2015 09/25/2014 05/22/2014 04/28/2014  Falls in the past year? Yes Yes Yes No No  Number falls in past yr: 2 or more 1 2 or more - -  Injury with Fall? Yes No No - -  Risk Factor Category  High Fall Risk - - - -  Risk for fall due to : Medication side effect;Impaired balance/gait - - - -  Risk for fall  due to (comments): Pt home is clutter-free.  No slip rug.  Uses shower chair.   - - - -  Follow up Education provided - - - -   Depression Screen PHQ 2/9 Scores 03/17/2015 01/13/2015 09/27/2013  PHQ - 2 Score 0 0 0     Cognitive Testing MMSE - Mini Mental State Exam 03/17/2015  Orientation to time 5  Orientation to Place 5  Registration 3  Attention/ Calculation 5  Recall 1  Language- name 2 objects 2  Language- repeat 1  Language- follow 3 step command 3  Language- read & follow direction 1  Write a sentence 1  Copy design 1  Total score 28    Immunization History  Administered Date(s) Administered  . Influenza,inj,Quad PF,36+ Mos 05/27/2014  . Tdap 02/17/2015   Screening Tests Health Maintenance  Topic Date Due  . URINE MICROALBUMIN  03/22/1962  . INFLUENZA VACCINE  02/23/2015  . PNEUMOCOCCAL POLYSACCHARIDE VACCINE (1) 03/31/2015 (Originally 03/22/1953)  . ZOSTAVAX  03/31/2015 (Originally 03/22/2012)  . Hepatitis C Screening  03/31/2015 (Originally August 21, 1950)  . HIV Screening  01/13/2016 (Originally 03/23/1967)  . HEMOGLOBIN A1C  04/24/2015  . FOOT EXAM  05/28/2015  . PAP SMEAR  05/28/2015  . OPHTHALMOLOGY EXAM  05/28/2015  . MAMMOGRAM  05/02/2016  . COLONOSCOPY  03/17/2023  . TETANUS/TDAP  02/16/2025    Plan:  Schedule Pap and Mammogram by the end of the year.    Receive flu shot in the Fall.  Avoid saturated fats (fried foods), limit simple carbs, monitor portion sizes.    Increase physical activity (Youtube-chair exercise and Raj Jan[employer]- Walk Away the Pounds).    Follow up with [name]  as scheduled.      During the course of the visit the patient was educated and counseled about the following appropriate screening and preventive services:   Vaccines to include Pneumoccal, Influenza, Hepatitis B, Td, Zostavax, HCV  Electrocardiogram  Cardiovascular Disease  Colorectal cancer screening  Bone density screening  Diabetes screening  Glaucoma  screening  Mammography/PAP  Nutrition counseling   Patient Instructions (the written plan) was given to the patient.   Rudene Anda, RN  03/18/2015

## 2015-03-17 NOTE — Patient Instructions (Addendum)
Schedule Pap and Mammogram by the end of the year.   Receive flu shot in the Fall.  Avoid saturated fats (fried foods), limit simple carbs, monitor portion sizes.    Increase physical activity (Youtube-chair exercise and [employer]- Walk Away the Pounds).    Follow with [name] as scheduled.    Fat and Cholesterol Control Diet Fat and cholesterol levels in your blood and organs are influenced by your diet. High levels of fat and cholesterol may lead to diseases of the heart, small and large blood vessels, gallbladder, liver, and pancreas. CONTROLLING FAT AND CHOLESTEROL WITH DIET Although exercise and lifestyle factors are important, your diet is key. That is because certain foods are known to raise cholesterol and others to lower it. The goal is to balance foods for their effect on cholesterol and more importantly, to replace saturated and trans fat with other types of fat, such as monounsaturated fat, polyunsaturated fat, and omega-3 fatty acids. On average, a person should consume no more than 15 to 17 g of saturated fat daily. Saturated and trans fats are considered "bad" fats, and they will raise LDL cholesterol. Saturated fats are primarily found in animal products such as meats, butter, and cream. However, that does not mean you need to give up all your favorite foods. Today, there are good tasting, low-fat, low-cholesterol substitutes for most of the things you like to eat. Choose low-fat or nonfat alternatives. Choose round or loin cuts of red meat. These types of cuts are lowest in fat and cholesterol. Chicken (without the skin), fish, veal, and ground [place] breast are great choices. Eliminate fatty meats, such as hot dogs and salami. Even shellfish have little or no saturated fat. Have a 3 oz (85 g) portion when you eat lean meat, poultry, or fish. Trans fats are also called "partially hydrogenated oils." They are oils that have been scientifically manipulated so that they are solid  at room temperature resulting in a longer shelf life and improved taste and texture of foods in which they are added. Trans fats are found in stick margarine, some tub margarines, cookies, crackers, and baked goods.  When baking and cooking, oils are a great substitute for butter. The monounsaturated oils are especially beneficial since it is believed they lower LDL and raise HDL. The oils you should avoid entirely are saturated tropical oils, such as coconut and palm.  Remember to eat a lot from food groups that are naturally free of saturated and trans fat, including fish, fruit, vegetables, beans, grains (barley, rice, couscous, bulgur wheat), and pasta (without cream sauces).  IDENTIFYING FOODS THAT LOWER FAT AND CHOLESTEROL  Soluble fiber may lower your cholesterol. This type of fiber is found in fruits such as apples, vegetables such as broccoli, potatoes, and carrots, legumes such as beans, peas, and lentils, and grains such as barley. Foods fortified with plant sterols (phytosterol) may also lower cholesterol. You should eat at least 2 g per day of these foods for a cholesterol lowering effect.  Read package labels to identify low-saturated fats, trans fat free, and low-fat foods at the supermarket. Select cheeses that have only 2 to 3 g saturated fat per ounce. Use a heart-healthy tub margarine that is free of trans fats or partially hydrogenated oil. When buying baked goods (cookies, crackers), avoid partially hydrogenated oils. Breads and muffins should be made from whole grains (whole-wheat or whole oat flour, instead of "flour" or "enriched flour"). Buy non-creamy canned soups with reduced salt and no  added fats.  FOOD PREPARATION TECHNIQUES  Never deep-fry. If you must fry, either stir-fry, which uses very little fat, or use non-stick cooking sprays. When possible, broil, bake, or roast meats, and steam vegetables. Instead of putting butter or margarine on vegetables, use lemon and herbs,  applesauce, and cinnamon (for squash and sweet potatoes). Use nonfat yogurt, salsa, and low-fat dressings for salads.  LOW-SATURATED FAT / LOW-FAT FOOD SUBSTITUTES Meats / Saturated Fat (g)  Avoid: Steak, marbled (3 oz/85 g) / 11 g  Choose: Steak, lean (3 oz/85 g) / 4 g  Avoid: Hamburger (3 oz/85 g) / 7 g  Choose: Hamburger, lean (3 oz/85 g) / 5 g  Avoid: Ham (3 oz/85 g) / 6 g  Choose: Ham, lean cut (3 oz/85 g) / 2.4 g  Avoid: Chicken, with skin, dark meat (3 oz/85 g) / 4 g  Choose: Chicken, skin removed, dark meat (3 oz/85 g) / 2 g  Avoid: Chicken, with skin, light meat (3 oz/85 g) / 2.5 g  Choose: Chicken, skin removed, light meat (3 oz/85 g) / 1 g Dairy / Saturated Fat (g)  Avoid: Whole milk (1 cup) / 5 g  Choose: Low-fat milk, 2% (1 cup) / 3 g  Choose: Low-fat milk, 1% (1 cup) / 1.5 g  Choose: Skim milk (1 cup) / 0.3 g  Avoid: Hard cheese (1 oz/28 g) / 6 g  Choose: Skim milk cheese (1 oz/28 g) / 2 to 3 g  Avoid: Cottage cheese, 4% fat (1 cup) / 6.5 g  Choose: Low-fat cottage cheese, 1% fat (1 cup) / 1.5 g  Avoid: Ice cream (1 cup) / 9 g  Choose: Sherbet (1 cup) / 2.5 g  Choose: Nonfat frozen yogurt (1 cup) / 0.3 g  Choose: Frozen fruit bar / trace  Avoid: Whipped cream (1 tbs) / 3.5 g  Choose: Nondairy whipped topping (1 tbs) / 1 g Condiments / Saturated Fat (g)  Avoid: Mayonnaise (1 tbs) / 2 g  Choose: Low-fat mayonnaise (1 tbs) / 1 g  Avoid: Butter (1 tbs) / 7 g  Choose: Extra light margarine (1 tbs) / 1 g  Avoid: Coconut oil (1 tbs) / 11.8 g  Choose: Olive oil (1 tbs) / 1.8 g  Choose: Corn oil (1 tbs) / 1.7 g  Choose: Safflower oil (1 tbs) / 1.2 g  Choose: Sunflower oil (1 tbs) / 1.4 g  Choose: Soybean oil (1 tbs) / 2.4 g  Choose: Canola oil (1 tbs) / 1 g Document Released: 07/11/2005 Document Revised: 11/05/2012 Document Reviewed: 10/09/2013 [employer] Patient Information 2015 [place], [place]. This information is not intended to  replace advice given to you by your health care provider. Make sure you discuss any questions you have with your health care provider.  Fat and Cholesterol Control Diet Your diet has an affect on your fat and cholesterol levels in your blood and organs. Too much fat and cholesterol in your blood can affect your:  Heart.  Blood vessels (arteries, veins).  Gallbladder.  Liver.  Pancreas. CONTROL FAT AND CHOLESTEROL WITH DIET Certain foods raise cholesterol and others lower it. It is important to replace bad fats with other types of fat.  Do not eat:  Fatty meats, such as hot dogs and salami.  Stick margarine and some tub margarines that have "partially hydrogenated oils" in them.  Baked goods, such as cookies and crackers that have "partially hydrogenated oils" in them.  Saturated tropical oils, such as coconut and  palm oil. Eat the following foods:  Round or loin cuts of red meat.  Chicken (without skin).  Fish.  Veal.  Ground [place] breast.  Shellfish.  Fruit, such as apples.  Vegetables, such as broccoli, potatoes, and carrots.  Beans, peas, and lentils (legumes).  Grains, such as barley, rice, couscous, and bulgar wheat.  Pasta (without cream sauces). Look for foods that are nonfat, low in fat, and low in cholesterol.  FIND FOODS THAT ARE LOWER IN FAT AND CHOLESTEROL  Find foods with soluble fiber and plant sterols (phytosterol). You should eat 2 grams a day of these foods. These foods include:  Fruits.  Vegetables.  Whole grains.  Dried beans and peas.  Nuts and seeds.  Read package labels. Look for low-saturated fats, trans fat free, low-fat foods.  Choose cheese that have only 2 to 3 grams of saturated fat per ounce.  Use heart-healthy tub margarine that is free of trans fat or partially hydrogenated oil.  Avoid buying baked goods that have partially hydrogenated oils in them. Instead, buy baked goods made with whole grains (whole-wheat or  whole oat flour). Avoid baked goods labeled with "flour" or "enriched flour."  Buy non-creamy canned soups with reduced salt and no added fats. PREPARING YOUR FOOD  Broil, bake, steam, or roast foods. Do not fry food.  Use non-stick cooking sprays.  Use lemon or herbs to flavor food instead of using butter or stick margarine.  Use nonfat yogurt, salsa, or low-fat dressings for salads. LOW-SATURATED FAT / LOW-FAT FOOD SUBSTITUTES  Meats / Saturated Fat (g)  Avoid: Steak, marbled (3 oz/85 g) / 11 g.  Choose: Steak, lean (3 oz/85 g) / 4 g.  Avoid: Hamburger (3 oz/85 g) / 7 g.  Choose: Hamburger, lean (3 oz/85 g) / 5 g.  Avoid: Ham (3 oz/85 g) / 6 g.  Choose: Ham, lean cut (3 oz/85 g) / 2.4 g.  Avoid: Chicken, with skin, dark meat (3 oz/85 g) / 4 g.  Choose: Chicken, skin removed, dark meat (3 oz/85 g) / 2 g.  Avoid: Chicken, with skin, light meat (3 oz/85 g) / 2.5 g.  Choose: Chicken, skin removed, light meat (3 oz/85 g) / 1 g. Dairy / Saturated Fat (g)  Avoid: Whole milk (1 cup) / 5 g.  Choose: Low-fat milk, 2% (1 cup) / 3 g.  Choose: Low-fat milk, 1% (1 cup) / 1.5 g.  Choose: Skim milk (1 cup) / 0.3 g.  Avoid: Hard cheese (1 oz/28 g) / 6 g.  Choose: Skim milk cheese (1 oz/28 g) / 2 to 3 g.  Avoid: Cottage cheese, 4% fat (1 cup) / 6.5 g.  Choose: Low-fat cottage cheese, 1% fat (1 cup) / 1.5 g.  Avoid: Ice cream (1 cup) / 9 g.  Choose: Sherbet (1 cup) / 2.5 g.  Choose: Nonfat frozen yogurt (1 cup) / 0.3 g.  Choose: Frozen fruit bar / trace.  Avoid: Whipped cream (1 tbs) / 3.5 g.  Choose: Nondairy whipped topping (1 tbs) / 1 g. Condiments / Saturated Fat (g)  Avoid: Mayonnaise (1 tbs) / 2 g.  Choose: Low-fat mayonnaise (1 tbs) / 1 g.  Avoid: Butter (1 tbs) / 7 g.  Choose: Extra light margarine (1 tbs) / 1 g.  Avoid: Coconut oil (1 tbs) / 11.8 g.  Choose: Olive oil (1 tbs) / 1.8 g.  Choose: Corn oil (1 tbs) / 1.7 g.  Choose: Safflower oil (1  tbs) / 1.2 g.  Choose: Sunflower oil (1 tbs) / 1.4 g.  Choose: Soybean oil (1 tbs) / 2.4 g .  Choose: Canola oil (1 tbs) / 1 g. Document Released: 01/10/2012 Document Revised: 03/13/2013 Document Reviewed: 10/10/2013 [employer] Patient Information 2015 [place], [place]. This information is not intended to replace advice given to you by your health care provider. Make sure you discuss any questions you have with your health care provider.  Fall Prevention and Home Safety Falls cause injuries and can affect all age groups. It is possible to prevent falls.  HOW TO PREVENT FALLS  Wear shoes with rubber soles that do not have an opening for your toes.  Keep the inside and outside of your house well lit.  Use night lights throughout your home.  Remove clutter from floors.  Clean up floor spills.  Remove throw rugs or fasten them to the floor with carpet tape.  Do not place electrical cords across pathways.  Put grab bars by your tub, shower, and toilet. Do not use towel bars as grab bars.  Put handrails on both sides of the stairway. Fix loose handrails.  Do not climb on stools or stepladders, if possible.  Do not wax your floors.  Repair uneven or unsafe sidewalks, walkways, or stairs.  Keep items you use a lot within reach.  Be aware of pets.  Keep emergency numbers next to the telephone.  Put smoke detectors in your home and near bedrooms. Ask your doctor what other things you can do to prevent falls. Document Released: 05/07/2009 Document Revised: 01/10/2012 Document Reviewed: 10/11/2011 [facility] Patient Information 2015 [place], [place]. This information is not intended to replace advice given to you by your health care provider. Make sure you discuss any questions you have with your health care provider.  Health Maintenance Adopting a healthy lifestyle and getting preventive care can go a long way to promote health and wellness. Talk with your health care provider about  what schedule of regular examinations is right for you. This is a good chance for you to check in with your provider about disease prevention and staying healthy. In between checkups, there are plenty of things you can do on your own. Experts have done a lot of research about which lifestyle changes and preventive measures are most likely to keep you healthy. Ask your health care provider for more information. WEIGHT AND DIET  Eat a healthy diet  Be sure to include plenty of vegetables, fruits, low-fat dairy products, and lean protein.  Do not eat a lot of foods high in solid fats, added sugars, or salt.  Get regular exercise. This is one of the most important things you can do for your health.  Most adults should exercise for at least 150 minutes each week. The exercise should increase your heart rate and make you sweat (moderate-intensity exercise).  Most adults should also do strengthening exercises at least twice a week. This is in addition to the moderate-intensity exercise.  Maintain a healthy weight  Body mass index (BMI) is a measurement that can be used to identify possible weight problems. It estimates body fat based on height and weight. Your health care provider can help determine your BMI and help you achieve or maintain a healthy weight.  For females 78 years of age and older:   A BMI below 18.5 is considered underweight.  A BMI of 18.5 to 24.9 is normal.  A BMI of 25 to 29.9 is considered overweight.  A BMI of 30 and above  is considered obese.  Watch levels of cholesterol and blood lipids  You should start having your blood tested for lipids and cholesterol at 63 years of age, then have this test every 5 years.  You may need to have your cholesterol levels checked more often if:  Your lipid or cholesterol levels are high.  You are older than 63 years of age.  You are at high risk for heart disease.  CANCER SCREENING   Lung Cancer  Lung cancer screening is  recommended for adults 38-9 years old who are at high risk for lung cancer because of a history of smoking.  A yearly low-dose CT scan of the lungs is recommended for people who:  Currently smoke.  Have quit within the past 15 years.  Have at least a 30-pack-year history of smoking. A pack year is smoking an average of one pack of cigarettes a day for 1 year.  Yearly screening should continue until it has been 15 years since you quit.  Yearly screening should stop if you develop a health problem that would prevent you from having lung cancer treatment.  Breast Cancer  Practice breast self-awareness. This means understanding how your breasts normally appear and feel.  It also means doing regular breast self-exams. Let your health care provider know about any changes, no matter how small.  If you are in your 20s or 30s, you should have a clinical breast exam (CBE) by a health care provider every 1-3 years as part of a regular health exam.  If you are 42 or older, have a CBE every year. Also consider having a breast X-ray (mammogram) every year.  If you have a family history of breast cancer, talk to your health care provider about genetic screening.  If you are at high risk for breast cancer, talk to your health care provider about having an MRI and a mammogram every year.  Breast cancer gene (BRCA) assessment is recommended for women who have family members with BRCA-related cancers. BRCA-related cancers include:  Breast.  Ovarian.  Tubal.  Peritoneal cancers.  Results of the assessment will determine the need for genetic counseling and BRCA1 and BRCA2 testing. Cervical Cancer Routine pelvic examinations to screen for cervical cancer are no longer recommended for nonpregnant women who are considered low risk for cancer of the pelvic organs (ovaries, uterus, and vagina) and who do not have symptoms. A pelvic examination may be necessary if you have symptoms including those  associated with pelvic infections. Ask your health care provider if a screening pelvic exam is right for you.   The Pap test is the screening test for cervical cancer for women who are considered at risk.  If you had a hysterectomy for a problem that was not cancer or a condition that could lead to cancer, then you no longer need Pap tests.  If you are older than 65 years, and you have had normal Pap tests for the past 10 years, you no longer need to have Pap tests.  If you have had past treatment for cervical cancer or a condition that could lead to cancer, you need Pap tests and screening for cancer for at least 20 years after your treatment.  If you no longer get a Pap test, assess your risk factors if they change (such as having a new sexual partner). This can affect whether you should start being screened again.  Some women have medical problems that increase their chance of getting cervical cancer. If this  is the case for you, your health care provider may recommend more frequent screening and Pap tests.  The human papillomavirus (HPV) test is another test that may be used for cervical cancer screening. The HPV test looks for the virus that can cause cell changes in the cervix. The cells collected during the Pap test can be tested for HPV.  The HPV test can be used to screen women 31 years of age and older. Getting tested for HPV can extend the interval between normal Pap tests from three to five years.  An HPV test also should be used to screen women of any age who have unclear Pap test results.  After 63 years of age, women should have HPV testing as often as Pap tests.  Colorectal Cancer  This type of cancer can be detected and often prevented.  Routine colorectal cancer screening usually begins at 63 years of age and continues through 63 years of age.  Your health care provider may recommend screening at an earlier age if you have risk factors for colon cancer.  Your health  care provider may also recommend using home test kits to check for hidden blood in the stool.  A small camera at the end of a tube can be used to examine your colon directly (sigmoidoscopy or colonoscopy). This is done to check for the earliest forms of colorectal cancer.  Routine screening usually begins at age 63.  Direct examination of the colon should be repeated every 5-10 years through 63 years of age. However, you may need to be screened more often if early forms of precancerous polyps or small growths are found. Skin Cancer  Check your skin from head to toe regularly.  Tell your health care provider about any new moles or changes in moles, especially if there is a change in a mole's shape or color.  Also tell your health care provider if you have a mole that is larger than the size of a pencil eraser.  Always use sunscreen. Apply sunscreen liberally and repeatedly throughout the day.  Protect yourself by wearing long sleeves, pants, a wide-brimmed hat, and sunglasses whenever you are outside. HEART DISEASE, DIABETES, AND HIGH BLOOD PRESSURE   Have your blood pressure checked at least every 1-2 years. High blood pressure causes heart disease and increases the risk of stroke.  If you are between 44 years and 54 years old, ask your health care provider if you should take aspirin to prevent strokes.  Have regular diabetes screenings. This involves taking a blood sample to check your fasting blood sugar level.  If you are at a normal weight and have a low risk for diabetes, have this test once every three years after 63 years of age.  If you are overweight and have a high risk for diabetes, consider being tested at a younger age or more often. PREVENTING INFECTION  Hepatitis B  If you have a higher risk for hepatitis B, you should be screened for this virus. You are considered at high risk for hepatitis B if:  You were born in a country where hepatitis B is common. Ask your  health care provider which countries are considered high risk.  Your parents were born in a high-risk country, and you have not been immunized against hepatitis B (hepatitis B vaccine).  You have HIV or AIDS.  You use needles to inject street drugs.  You live with someone who has hepatitis B.  You have had sex with someone who  has hepatitis B.  You get hemodialysis treatment.  You take certain medicines for conditions, including cancer, organ transplantation, and autoimmune conditions. Hepatitis C  Blood testing is recommended for:  Everyone born from [birth year] through 1964.  Anyone with known risk factors for hepatitis C. Sexually transmitted infections (STIs)  You should be screened for sexually transmitted infections (STIs) including gonorrhea and chlamydia if:  You are sexually active and are younger than 63 years of age.  You are older than 63 years of age and your health care provider tells you that you are at risk for this type of infection.  Your sexual activity has changed since you were last screened and you are at an increased risk for chlamydia or gonorrhea. Ask your health care provider if you are at risk.  If you do not have HIV, but are at risk, it may be recommended that you take a prescription medicine daily to prevent HIV infection. This is called pre-exposure prophylaxis (PrEP). You are considered at risk if:  You are sexually active and do not regularly use condoms or know the HIV status of your partner(s).  You take drugs by injection.  You are sexually active with a partner who has HIV. Talk with your health care provider about whether you are at high risk of being infected with HIV. If you choose to begin PrEP, you should first be tested for HIV. You should then be tested every 3 months for as long as you are taking PrEP.  PREGNANCY   If you are premenopausal and you may become pregnant, ask your health care provider about preconception counseling.  If  you may become pregnant, take 400 to 800 micrograms (mcg) of folic acid every day.  If you want to prevent pregnancy, talk to your health care provider about birth control (contraception). OSTEOPOROSIS AND MENOPAUSE   Osteoporosis is a disease in which the bones lose minerals and strength with aging. This can result in serious bone fractures. Your risk for osteoporosis can be identified using a bone density scan.  If you are 34 years of age or older, or if you are at risk for osteoporosis and fractures, ask your health care provider if you should be screened.  Ask your health care provider whether you should take a calcium or vitamin D supplement to lower your risk for osteoporosis.  Menopause may have certain physical symptoms and risks.  Hormone replacement therapy may reduce some of these symptoms and risks. Talk to your health care provider about whether hormone replacement therapy is right for you.  HOME CARE INSTRUCTIONS   Schedule regular health, dental, and eye exams.  Stay current with your immunizations.   Do not use any tobacco products including cigarettes, chewing tobacco, or electronic cigarettes.  If you are pregnant, do not drink alcohol.  If you are breastfeeding, limit how much and how often you drink alcohol.  Limit alcohol intake to no more than 1 drink per day for nonpregnant women. One drink equals 12 ounces of beer, 5 ounces of wine, or 1 ounces of hard liquor.  Do not use street drugs.  Do not share needles.  Ask your health care provider for help if you need support or information about quitting drugs.  Tell your health care provider if you often feel depressed.  Tell your health care provider if you have ever been abused or do not feel safe at home. Document Released: 01/24/2011 Document Revised: 11/25/2013 Document Reviewed: 06/12/2013 [employer] Patient Information 2015 [place]  [place]. This information is not intended to replace advice given to you  by your health care provider. Make sure you discuss any questions you have with your health care provider.

## 2015-04-06 ENCOUNTER — Encounter: Payer: Self-pay | Admitting: Family Medicine

## 2015-04-06 ENCOUNTER — Ambulatory Visit (INDEPENDENT_AMBULATORY_CARE_PROVIDER_SITE_OTHER): Payer: Medicare HMO | Admitting: Family Medicine

## 2015-04-06 ENCOUNTER — Encounter: Payer: Self-pay | Admitting: Hematology & Oncology

## 2015-04-06 ENCOUNTER — Other Ambulatory Visit: Payer: Self-pay | Admitting: Family Medicine

## 2015-04-06 ENCOUNTER — Ambulatory Visit (HOSPITAL_BASED_OUTPATIENT_CLINIC_OR_DEPARTMENT_OTHER): Payer: Medicare HMO

## 2015-04-06 ENCOUNTER — Other Ambulatory Visit (HOSPITAL_BASED_OUTPATIENT_CLINIC_OR_DEPARTMENT_OTHER): Payer: Medicare HMO

## 2015-04-06 ENCOUNTER — Ambulatory Visit (HOSPITAL_BASED_OUTPATIENT_CLINIC_OR_DEPARTMENT_OTHER): Payer: Medicare HMO | Admitting: Family

## 2015-04-06 VITALS — BP 114/75 | HR 79 | Temp 98.4°F | Resp 16 | Ht 67.0 in

## 2015-04-06 VITALS — BP 112/72 | HR 79 | Temp 98.8°F | Ht 67.0 in | Wt 259.0 lb

## 2015-04-06 DIAGNOSIS — E119 Type 2 diabetes mellitus without complications: Secondary | ICD-10-CM

## 2015-04-06 DIAGNOSIS — M62838 Other muscle spasm: Secondary | ICD-10-CM

## 2015-04-06 DIAGNOSIS — Z452 Encounter for adjustment and management of vascular access device: Secondary | ICD-10-CM | POA: Diagnosis not present

## 2015-04-06 DIAGNOSIS — E1142 Type 2 diabetes mellitus with diabetic polyneuropathy: Secondary | ICD-10-CM

## 2015-04-06 DIAGNOSIS — C169 Malignant neoplasm of stomach, unspecified: Secondary | ICD-10-CM

## 2015-04-06 DIAGNOSIS — Z23 Encounter for immunization: Secondary | ICD-10-CM | POA: Diagnosis not present

## 2015-04-06 LAB — CBC WITH DIFFERENTIAL (CANCER CENTER ONLY)
BASO#: 0 10*3/uL (ref 0.0–0.2)
BASO%: 0.3 % (ref 0.0–2.0)
EOS%: 6.4 % (ref 0.0–7.0)
Eosinophils Absolute: 0.5 10*3/uL (ref 0.0–0.5)
HEMATOCRIT: 36.7 % (ref 34.8–46.6)
HGB: 12.2 g/dL (ref 11.6–15.9)
LYMPH#: 2.7 10*3/uL (ref 0.9–3.3)
LYMPH%: 36.2 % (ref 14.0–48.0)
MCH: 29.4 pg (ref 26.0–34.0)
MCHC: 33.2 g/dL (ref 32.0–36.0)
MCV: 88 fL (ref 81–101)
MONO#: 0.7 10*3/uL (ref 0.1–0.9)
MONO%: 9.7 % (ref 0.0–13.0)
NEUT#: 3.5 10*3/uL (ref 1.5–6.5)
NEUT%: 47.4 % (ref 39.6–80.0)
Platelets: 376 10*3/uL (ref 145–400)
RBC: 4.15 10*6/uL (ref 3.70–5.32)
RDW: 14.8 % (ref 11.1–15.7)
WBC: 7.5 10*3/uL (ref 3.9–10.0)

## 2015-04-06 LAB — URINALYSIS, MICROSCOPIC (CHCC SATELLITE)
Bilirubin (Urine): NEGATIVE
Blood: NEGATIVE
Glucose: NEGATIVE mg/dL
KETONES: NEGATIVE mg/dL
LEUKOCYTE ESTERASE: NEGATIVE
Nitrite: NEGATIVE
PROTEIN: NEGATIVE mg/dL
Specific Gravity, Urine: 1.015 (ref 1.003–1.035)
Urobilinogen, UR: 0.2 mg/dL (ref 0.2–1)
pH: 6 (ref 4.60–8.00)

## 2015-04-06 LAB — LIPID PANEL
CHOLESTEROL: 194 mg/dL (ref 125–200)
HDL: 48 mg/dL (ref >=46)
LDL Cholesterol: 112 mg/dL (ref <130)
Total CHOL/HDL Ratio: 4 Ratio (ref <=5.0)
Triglycerides: 170 mg/dL — ABNORMAL HIGH (ref <150)
VLDL: 34 mg/dL — ABNORMAL HIGH (ref <30)

## 2015-04-06 LAB — HEPATIC FUNCTION PANEL
ALBUMIN: 4 g/dL (ref 3.6–5.1)
ALK PHOS: 60 U/L (ref 33–130)
ALT: 15 U/L (ref 6–29)
AST: 15 U/L (ref 10–35)
BILIRUBIN TOTAL: 0.3 mg/dL (ref 0.2–1.2)
Bilirubin, Direct: <0.1 mg/dL (ref <=0.2)
Total Protein: 7 g/dL (ref 6.1–8.1)

## 2015-04-06 LAB — COMPREHENSIVE METABOLIC PANEL
ALK PHOS: 60 U/L (ref 33–130)
ALT: 16 U/L (ref 6–29)
AST: 16 U/L (ref 10–35)
Albumin: 4 g/dL (ref 3.6–5.1)
BUN: 24 mg/dL (ref 7–25)
CALCIUM: 9.6 mg/dL (ref 8.6–10.4)
CO2: 22 mmol/L (ref 20–31)
Chloride: 100 mmol/L (ref 98–110)
Creatinine, Ser: 1.22 mg/dL — ABNORMAL HIGH (ref 0.50–0.99)
Glucose, Bld: 179 mg/dL — ABNORMAL HIGH (ref 65–99)
POTASSIUM: 5.2 mmol/L (ref 3.5–5.3)
Sodium: 138 mmol/L (ref 135–146)
TOTAL PROTEIN: 7.1 g/dL (ref 6.1–8.1)
Total Bilirubin: 0.3 mg/dL (ref 0.2–1.2)

## 2015-04-06 LAB — MICROALBUMIN, URINE: MICROALB UR: 0.3 mg/dL (ref <2.0)

## 2015-04-06 LAB — HEMOGLOBIN A1C
HEMOGLOBIN A1C: 9.3 % — AB (ref <5.7)
Mean Plasma Glucose: 220 mg/dL — ABNORMAL HIGH (ref <117)

## 2015-04-06 MED ORDER — HEPARIN SOD (PORK) LOCK FLUSH 100 UNIT/ML IV SOLN
500.0000 [IU] | Freq: Once | INTRAVENOUS | Status: AC
Start: 2015-04-06 — End: 2015-04-06
  Administered 2015-04-06: 500 [IU] via INTRAVENOUS
  Filled 2015-04-06: qty 5

## 2015-04-06 MED ORDER — SODIUM CHLORIDE 0.9 % IJ SOLN
10.0000 mL | INTRAMUSCULAR | Status: DC | PRN
  Administered 2015-04-06: 10 mL via INTRAVENOUS
  Filled 2015-04-06: qty 10

## 2015-04-06 MED ORDER — HYDROCODONE-ACETAMINOPHEN 5-325 MG PO TABS: 1.0000 | ORAL_TABLET | Freq: Four times a day (QID) | ORAL | Status: DC | PRN

## 2015-04-06 MED ORDER — METFORMIN HCL 850 MG PO TABS: 850.0000 mg | ORAL_TABLET | Freq: Two times a day (BID) | ORAL | Status: DC

## 2015-04-06 MED ORDER — METAXALONE 800 MG PO TABS: 800.0000 mg | ORAL_TABLET | Freq: Four times a day (QID) | ORAL | Status: DC

## 2015-04-06 NOTE — Progress Notes (Signed)
Patient ID: [name], female    DOB: [birth year]  Age: 64 y.o. MRN: [id number]    Subjective:  Subjective HPI DANIKAH BUDZIK presents for f/u neuropathy and pain. She doesn't f/u with endo until next year.  The neurontin is helping.    Review of Systems  Constitutional: Negative for diaphoresis, appetite change, fatigue and unexpected weight change.  Eyes: Negative for pain, redness and visual disturbance.  Respiratory: Negative for cough, chest tightness, shortness of breath and wheezing.   Cardiovascular: Negative for chest pain, palpitations and leg swelling.  Endocrine: Negative for cold intolerance, heat intolerance, polydipsia, polyphagia and polyuria.  Genitourinary: Negative for dysuria, frequency and difficulty urinating.  Musculoskeletal: Positive for myalgias and arthralgias.  Neurological: Positive for numbness. Negative for dizziness, light-headedness and headaches.  Psychiatric/Behavioral: Negative for dysphoric mood and decreased concentration. The patient is not nervous/anxious.     History Past Medical History  Diagnosis Date  . stomach ca dx'd 2010    chemo/xrt comp 05/2009  . Hypertension   . Diabetes mellitus   . Asthma   . Sleep apnea   . Degenerative joint disease   . Hyperlipidemia   . GERD (gastroesophageal reflux disease)   . Neuropathy     bilateral hands and feet    She has past surgical history that includes Total knee arthroplasty; Total hip arthroplasty; Shoulder surgery; Appendectomy; Cesarean section; Hernia repair; Colon surgery; and Esophagogastroduodenoscopy (N/A, 10/15/2012).   Her family history includes Alzheimer's disease in her father; Cancer in her maternal grandfather and mother; Diabetes in her brother and maternal grandmother. There is no history of Suicidality, Depression, Dementia, or Anxiety disorder.She reports that she quit smoking about 26 years ago. Her smoking use included Cigarettes. She started smoking about 41 years ago. She  has a 15 pack-year smoking history. She has never used smokeless tobacco. She reports that she does not drink alcohol or use illicit drugs.  Current Outpatient Prescriptions on File Prior to Visit  Medication Sig Dispense Refill  . albuterol (PROVENTIL HFA;VENTOLIN HFA) 108 (90 BASE) MCG/ACT inhaler Inhale 2 puffs into lungs every 8 hours prn wheezing 1 Inhaler 0  . aspirin 81 MG tablet Take 81 mg by mouth at bedtime.    Marland Kitchen azelastine (ASTELIN) 0.1 % nasal spray Place 2 sprays into both nostrils at bedtime as needed for rhinitis. Use in each nostril as directed 30 mL 3  . azelastine (ASTELIN) 0.1 % nasal spray Place 1 spray into both nostrils 2 (two) times daily. Use in each nostril as directed 30 mL 12  . carvedilol (COREG) 12.5 MG tablet Take 1 tablet (12.5 mg total) by mouth every morning. 90 tablet 1  . DULoxetine (CYMBALTA) 60 MG capsule Take 1 capsule (60 mg total) by mouth daily. 90 capsule 3  . erythromycin base (E-MYCIN) 500 MG tablet     . gabapentin (NEURONTIN) 300 MG capsule 2 po tid 180 capsule 3  . glimepiride (AMARYL) 4 MG tablet TAKE 1 TABLET (4 MG TOTAL) BY MOUTH 2 (TWO) TIMES DAILY. 90 tablet 1  . hydrochlorothiazide (HYDRODIURIL) 25 MG tablet Take 1 tablet (25 mg total) by mouth daily. 90 tablet 1  . lidocaine-prilocaine (EMLA) cream Apply 1 application topically as needed. 30 g 12  . montelukast (SINGULAIR) 10 MG tablet Take 1 tablet (10 mg total) by mouth at bedtime. 90 tablet 1  . Multiple Vitamin (MULTIVITAMIN WITH MINERALS) TABS tablet Take 1 tablet by mouth daily.    . NONFORMULARY OR COMPOUNDED ITEM  Bmp, hgba1c, hep, lipid, microalbumin, ua----dx DM II uncontrolled with neuropathy and htn 1 each 0  . prochlorperazine (COMPAZINE) 10 MG tablet Take 1 tablet (10 mg total) by mouth every 6 (six) hours as needed for nausea or vomiting. 60 tablet 4  . senna-docusate (SENOKOT-S) 8.6-50 MG per tablet Take 2 tablets by mouth daily. 20 tablet 0   No current facility-administered  medications on file prior to visit.     Objective:  Objective Physical Exam  Constitutional: She is oriented to person, place, and time. She appears well-developed and well-nourished.  HENT:  Head: Normocephalic and atraumatic.  Eyes: Conjunctivae and EOM are normal.  Neck: Normal range of motion. Neck supple. No JVD present. Carotid bruit is not present. No thyromegaly present.  Cardiovascular: Normal rate, regular rhythm and normal heart sounds.   No murmur heard. Pulmonary/Chest: Effort normal and breath sounds normal. No respiratory distress. She has no wheezes. She has no rales. She exhibits no tenderness.  Musculoskeletal: She exhibits no edema.  Neurological: She is alert and oriented to person, place, and time.  Sensory exam of the foot is normal, tested with the monofilament. Good pulses, no lesions or ulcers, good peripheral pulses.   Psychiatric: She has a normal mood and affect.   BP 112/72 mmHg  Pulse 79  Temp(Src) 98.8 F (37.1 C) (Oral)  Ht 5' 7"  (1.702 m)  Wt 259 lb (117.482 kg)  BMI 40.56 kg/m2 Wt Readings from Last 3 Encounters:  04/06/15 259 lb (117.482 kg)  03/17/15 258 lb 3.2 oz (117.119 kg)  02/17/15 256 lb 12.8 oz (116.484 kg)     Lab Results  Component Value Date   WBC 7.5 04/06/2015   HGB 12.2 04/06/2015   HCT 36.7 04/06/2015   PLT 376 04/06/2015   GLUCOSE 179* 04/06/2015   CHOL 194 04/06/2015   TRIG 170* 04/06/2015   HDL 48 04/06/2015   LDLCALC 112 04/06/2015   ALT 16 04/06/2015   AST 16 04/06/2015   NA 138 04/06/2015   K 5.2 04/06/2015   CL 100 04/06/2015   CREATININE 1.22* 04/06/2015   BUN 24 04/06/2015   CO2 22 04/06/2015   TSH 2.435 07/31/2014   INR 0.98 11/05/2010   HGBA1C 9.3* 04/06/2015   MICROALBUR 0.3 04/06/2015    Dg Lumbar Spine Complete  01/14/2015   CLINICAL DATA:  Lumbago following fall earlier today  EXAM: LUMBAR SPINE - COMPLETE 4+ VIEW  COMPARISON:  December 27, 2013  FINDINGS: Frontal, lateral, spot lumbosacral  lateral, and bilateral oblique views were obtained. There are 5 non-rib-bearing lumbar type vertebral bodies. There is no fracture or spondylolisthesis. There is stable disc space narrowing at T11-12. There is no appreciable lumbar disc space narrowing; lumbar discs appear stable. There is facet osteoarthritic change at L3-4, L4-5, and L5-S1 on the left at L4-5 and L5-S1 on the right, stable.  IMPRESSION: Areas of osteoarthritic change, essentially stable. No acute fracture or spondylolisthesis.   Electronically Signed   By: Lowella Grip III M.D.   On: 01/14/2015 16:05   Dg Knee Complete 4 Views Left  01/14/2015   CLINICAL DATA:  Fall  EXAM: LEFT KNEE - COMPLETE 4+ VIEW  COMPARISON:  07/08/2013  FINDINGS: Total knee replacement in satisfactory position and alignment. No loosening  Negative for fracture  IMPRESSION: Negative for fracture.   Electronically Signed   By: Franchot Gallo M.D.   On: 01/14/2015 16:23     Assessment & Plan:  Plan I have discontinued Ms.  Poulter's tiZANidine. I am also having her start on metaxalone. Additionally, I am having her maintain her multivitamin with minerals, senna-docusate, lidocaine-prilocaine, prochlorperazine, albuterol, montelukast, carvedilol, hydrochlorothiazide, glimepiride, azelastine, azelastine, NONFORMULARY OR COMPOUNDED ITEM, gabapentin, DULoxetine, aspirin, erythromycin base, HYDROcodone-acetaminophen, and metFORMIN.  Meds ordered this encounter  Medications  . HYDROcodone-acetaminophen (NORCO/VICODIN) 5-325 MG per tablet    Sig: Take 1 tablet by mouth every 6 (six) hours as needed for moderate pain.    Dispense:  30 tablet    Refill:  0  . metFORMIN (GLUCOPHAGE) 850 MG tablet    Sig: Take 1 tablet (850 mg total) by mouth 2 (two) times daily.    Dispense:  60 tablet    Refill:  3  . metaxalone (SKELAXIN) 800 MG tablet    Sig: Take 1 tablet (800 mg total) by mouth 4 (four) times daily.    Dispense:  30 tablet    Refill:  0    Problem List  Items Addressed This Visit      Unprioritized   Diabetic polyneuropathy associated with type 2 diabetes mellitus - Primary    con't neurontin 300 mg tid  She has 16m as well -- she will add 100 tid slowly to see if she has improvement of pain We caused possiblility of referral if pain does not improve      Relevant Medications   HYDROcodone-acetaminophen (NORCO/VICODIN) 5-325 MG per tablet   metFORMIN (GLUCOPHAGE) 850 MG tablet   Other Relevant Orders   CBC with Differential/Platelet   Comp Met (CMET)   Lipid panel   Microalbumin / creatinine urine ratio   POCT urinalysis dipstick   Hemoglobin A1c    Other Visit Diagnoses    Muscle spasm        Relevant Medications    metaxalone (SKELAXIN) 800 MG tablet    Need for prophylactic vaccination and inoculation against influenza        Relevant Orders    Flu Vaccine QUAD 36+ mos PF IM (Fluarix & Fluzone Quad PF) (Completed)       Follow-up: Return in about 3 months (around 07/06/2015), or if symptoms worsen or fail to improve, for hypertension, hyperlipidemia, diabetes II.  YGarnet Koyanagi DO

## 2015-04-06 NOTE — Progress Notes (Signed)
Hematology and Oncology Follow Up Visit  [name] [id number] [birth year] 64 y.o. 04/06/2015   Principle Diagnosis:  Stage IB (T1, N1, M0) adenocarcinoma of the stomach  Current Therapy:   Observation    Interim History:  [name] is here today for a follow-up. She is still having worsening neuropathy in her hands and feet. She plant to discuss this with [name] today at her appointment and see about having another adjustment made in her gabapentin dose.  She states that her blood sugars have not been well controlled. She has been making lots of cakes with her grandchildren and has not been eating like she should. We discussed the importance of following the right diet and taking her medication as prescribed. We also discussed it's effects on her worsening neuropathy. She verbalized understanding.  She has had no fever, chills, n/v, cough, rash, dizziness, SOB, chest pain, changes in bowel or bladder habits. She has had no blood in her urine or stool. She is making sure to stay well hydrated and her weight is stable.  She has had no swelling or tenderness in her extremities. She has an occasional muscle spasm in her left side. She denies pain at this time.   Medications:    Medication List       This list is accurate as of: 04/06/15 11:38 AM.  Always use your most recent med list.               albuterol 108 (90 BASE) MCG/ACT inhaler  Commonly known as:  PROVENTIL HFA;VENTOLIN HFA  Inhale 2 puffs into lungs every 8 hours prn wheezing     aspirin 81 MG tablet  Take 81 mg by mouth at bedtime.     azelastine 0.1 % nasal spray  Commonly known as:  ASTELIN  Place 2 sprays into both nostrils at bedtime as needed for rhinitis. Use in each nostril as directed     azelastine 0.1 % nasal spray  Commonly known as:  ASTELIN  Place 1 spray into both nostrils 2 (two) times daily. Use in each nostril as directed     carvedilol 12.5 MG tablet  Commonly known as:  COREG  Take 1  tablet (12.5 mg total) by mouth every morning.     DULoxetine 60 MG capsule  Commonly known as:  CYMBALTA  Take 1 capsule (60 mg total) by mouth daily.     erythromycin base 500 MG tablet  Commonly known as:  E-MYCIN     gabapentin 300 MG capsule  Commonly known as:  NEURONTIN  2 po tid     glimepiride 4 MG tablet  Commonly known as:  AMARYL  TAKE 1 TABLET (4 MG TOTAL) BY MOUTH 2 (TWO) TIMES DAILY.     hydrochlorothiazide 25 MG tablet  Commonly known as:  HYDRODIURIL  Take 1 tablet (25 mg total) by mouth daily.     HYDROcodone-acetaminophen 5-325 MG per tablet  Commonly known as:  NORCO/VICODIN  Take 1 tablet by mouth every 6 (six) hours as needed for moderate pain.     lidocaine-prilocaine cream  Commonly known as:  EMLA  Apply 1 application topically as needed.     metFORMIN 850 MG tablet  Commonly known as:  GLUCOPHAGE  Take 1 tablet (850 mg total) by mouth 2 (two) times daily.     montelukast 10 MG tablet  Commonly known as:  SINGULAIR  Take 1 tablet (10 mg total) by mouth at bedtime.  multivitamin with minerals Tabs tablet  Take 1 tablet by mouth daily.     NONFORMULARY OR COMPOUNDED ITEM  Bmp, hgba1c, hep, lipid, microalbumin, ua----dx DM II uncontrolled with neuropathy and htn     prochlorperazine 10 MG tablet  Commonly known as:  COMPAZINE  Take 1 tablet (10 mg total) by mouth every 6 (six) hours as needed for nausea or vomiting.     senna-docusate 8.6-50 MG per tablet  Commonly known as:  Senokot-S  Take 2 tablets by mouth daily.     tiZANidine 4 MG tablet  Commonly known as:  ZANAFLEX  Take 1 tablet (4 mg total) by mouth every 6 (six) hours as needed for muscle spasms.        Allergies:  Allergies  Allergen Reactions  . Adhesive [Tape] Other (See Comments)    Burn Skin  . Codeine Other (See Comments)    paranoid  . Zocor [Simvastatin - High Dose] Other (See Comments)    Muscle spasms  . Penicillins Rash    Past Medical History,  Surgical history, Social history, and Family History were reviewed and updated.  Review of Systems: All other 10 point review of systems is negative.   Physical Exam:  height is 5' 7"  (1.702 m). Her oral temperature is 98.4 F (36.9 C). Her blood pressure is 114/75 and her pulse is 79. Her respiration is 16.   Wt Readings from Last 3 Encounters:  04/06/15 259 lb (117.482 kg)  03/17/15 258 lb 3.2 oz (117.119 kg)  02/17/15 256 lb 12.8 oz (116.484 kg)    Ocular: Sclerae unicteric, pupils equal, round and reactive to light Ear-nose-throat: Oropharynx clear, dentition fair Lymphatic: No cervical or supraclavicular adenopathy Lungs no rales or rhonchi, good excursion bilaterally Heart regular rate and rhythm, no murmur appreciated Abd soft, nontender, positive bowel sounds MSK no focal spinal tenderness, no joint edema Neuro: non-focal, well-oriented, appropriate affect Breasts: Deferred  Lab Results  Component Value Date   WBC 7.5 04/06/2015   HGB 12.2 04/06/2015   HCT 36.7 04/06/2015   MCV 88 04/06/2015   PLT 376 04/06/2015   Lab Results  Component Value Date   FERRITIN 306* 09/25/2014   IRON 62 09/25/2014   TIBC 249 09/25/2014   UIBC 187 09/25/2014   IRONPCTSAT 25 09/25/2014   Lab Results  Component Value Date   RETICCTPCT 1.5 04/28/2014   RBC 4.15 04/06/2015   RETICCTABS 59.9 04/28/2014   No results found for: KPAFRELGTCHN, LAMBDASER, KAPLAMBRATIO No results found for: Kandis Cocking, IGMSERUM No results found for: Odetta Pink, SPEI   Chemistry      Component Value Date/Time   NA 141 01/13/2015 1030   NA 138 10/22/2014 1117   NA 146* 09/25/2014 0933   K 4.9 01/13/2015 1030   K 5.1 10/22/2014 1117   K 5.1* 09/25/2014 0933   CL 102 10/22/2014 1117   CL 102 09/25/2014 0933   CO2 28 01/13/2015 1030   CO2 27 10/22/2014 1117   CO2 30 09/25/2014 0933   BUN 18.7 01/13/2015 1030   BUN 21 10/22/2014 1117   BUN  15 09/25/2014 0933   CREATININE 1.2* 01/13/2015 1030   CREATININE 1.10 10/22/2014 1117   CREATININE 1.34* 12/27/2013 1440      Component Value Date/Time   CALCIUM 9.7 01/13/2015 1030   CALCIUM 10.4 10/22/2014 1117   CALCIUM 9.5 09/25/2014 0933   ALKPHOS 70 01/13/2015 1030   ALKPHOS 82 09/25/2014 0933  ALKPHOS 81 07/31/2014 1334   AST 16 01/13/2015 1030   AST 20 09/25/2014 0933   AST 15 07/31/2014 1334   ALT 17 01/13/2015 1030   ALT 16 09/25/2014 0933   ALT 12 07/31/2014 1334   BILITOT 0.35 01/13/2015 1030   BILITOT 0.50 09/25/2014 0933   BILITOT 0.4 07/31/2014 1334     Impression and Plan: [name] is a 64 year old African [place] female with stage IB stomach cancer. She underwent resection and completed adjuvant radiation and chemotherapy 7 years ago. From our standpoint she is doing well. There has been no evidence of recurrence.  She had her labs drawn from her port a cath and then had it flushed today. We will continue to flush her cath every 6 weeks.  We will plan to see her back in 3 months for labs and follow-up.  She knows to contact us with any questions or concerns. We can certainly see her sooner if need be.   Eliezer Bottom, NP 9/12/201611:38 AM

## 2015-04-06 NOTE — Patient Instructions (Signed)
Implanted Port Insertion, Care After Refer to this sheet in the next few weeks. These instructions provide you with information on caring for yourself after your procedure. Your health care provider may also give you more specific instructions. Your treatment has been planned according to current medical practices, but problems sometimes occur. Call your health care provider if you have any problems or questions after your procedure. WHAT TO EXPECT AFTER THE PROCEDURE After your procedure, it is typical to have the following:   Discomfort at the port insertion site. Ice packs to the area will help.  Bruising on the skin over the port. This will subside in 3-4 days. HOME CARE INSTRUCTIONS  After your port is placed, you will get a manufacturer's information card. The card has information about your port. Keep this card with you at all times.   Know what kind of port you have. There are many types of ports available.   Wear a medical alert bracelet in case of an emergency. This can help alert health care workers that you have a port.   The port can stay in for as long as your health care provider believes it is necessary.   A home health care nurse may give medicines and take care of the port.   You or a family member can get special training and directions for giving medicine and taking care of the port at home.  SEEK MEDICAL CARE IF:   Your port does not flush or you are unable to get a blood return.   You have a fever or chills. SEEK IMMEDIATE MEDICAL CARE IF:  You have new fluid or pus coming from your incision.   You notice a bad smell coming from your incision site.   You have swelling, pain, or more redness at the incision or port site.   You have chest pain or shortness of breath. Document Released: 05/01/2013 Document Revised: 07/16/2013 Document Reviewed: 05/01/2013 [facility] Patient Information 2015 [place], [place]. This information is not intended to replace  advice given to you by your health care provider. Make sure you discuss any questions you have with your health care provider.

## 2015-04-06 NOTE — Progress Notes (Signed)
Pre visit review using our clinic review tool, if applicable. No additional management support is needed unless otherwise documented below in the visit note. 

## 2015-04-06 NOTE — Progress Notes (Signed)
Patient is getting flu vaccine at PCP office.

## 2015-04-06 NOTE — Assessment & Plan Note (Signed)
con't neurontin 300 mg tid  She has 131m as well -- she will add 100 tid slowly to see if she has improvement of pain We caused possiblility of referral if pain does not improve

## 2015-04-06 NOTE — Patient Instructions (Signed)

## 2015-04-07 ENCOUNTER — Telehealth: Payer: Self-pay | Admitting: Family Medicine

## 2015-04-07 NOTE — Telephone Encounter (Signed)
°  Relation to pt: self  Call back [phone] Pharmacy: [id number] [phone]Phone) [phone](Fax)         Reason for call:  Patient states metaxalone (SKELAXIN) 800 MG tablet is to expensive requesting an alternate

## 2015-04-08 NOTE — Telephone Encounter (Signed)
Patient unavailable at this time; spoke with the patient's daughter regarding the below. Provided her with instructions for the patient to contact her insurance company and request to be given an alternative list of medications that will be affordable for her and then give the office a call back with those that were recommended.

## 2015-04-10 IMAGING — CR DG LUMBAR SPINE COMPLETE 4+V
5 series · 5 of 5 positions shown · non-contrast
Comparison: CT of the abdomen and pelvis 10/15/2013.

CLINICAL DATA: Low back pain.  Right-sided.

EXAM:
LUMBAR SPINE - COMPLETE 4+ VIEW

[t l-spine a.p.]
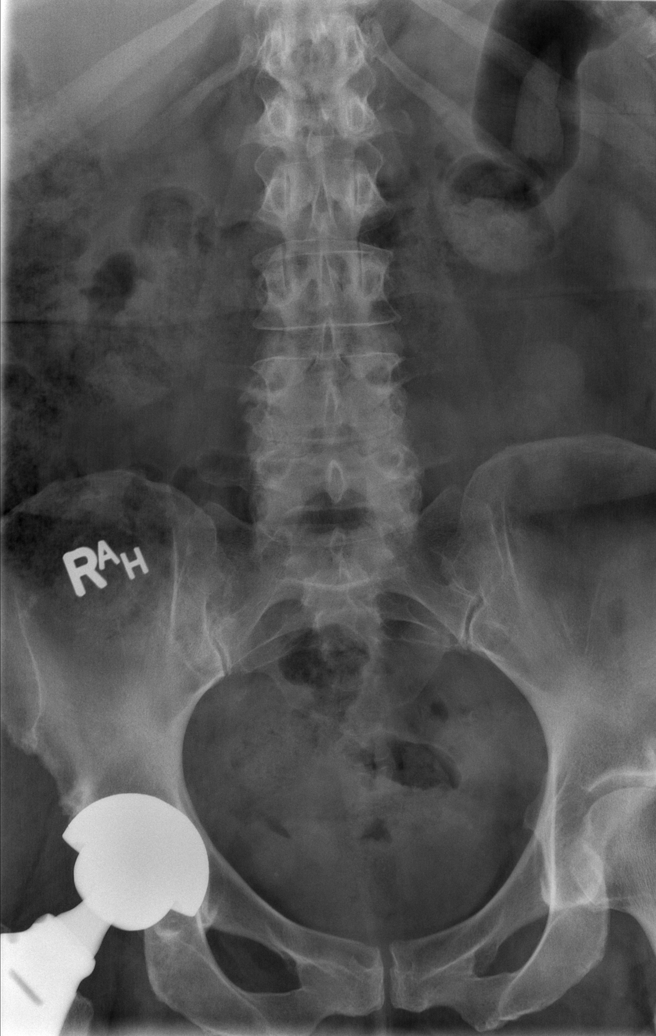

[t l-spine oblique exposure (1 of 2)]
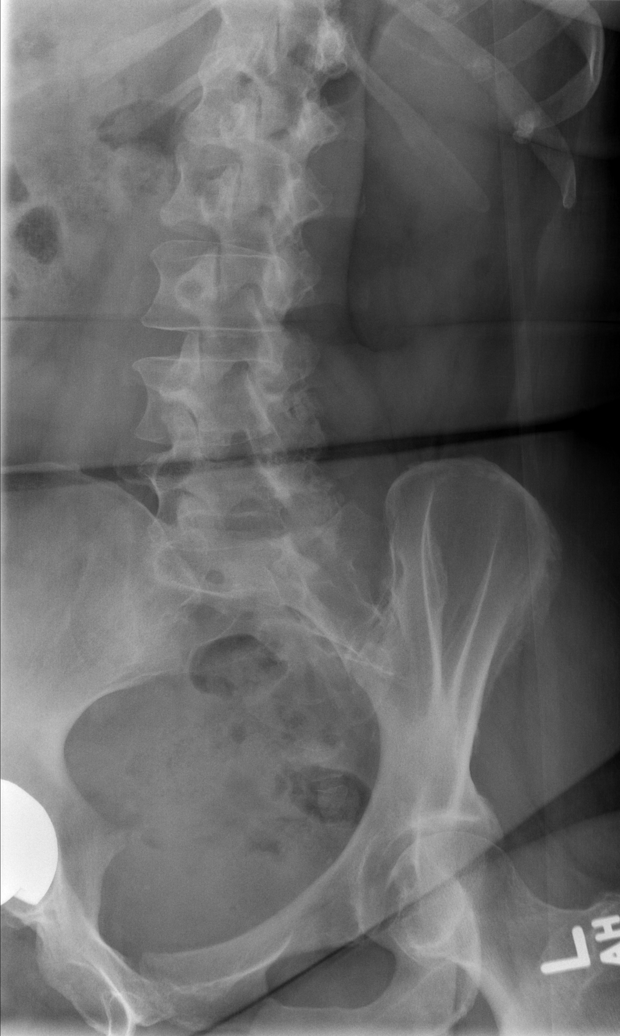

[t l-spine oblique exposure (2 of 2)]
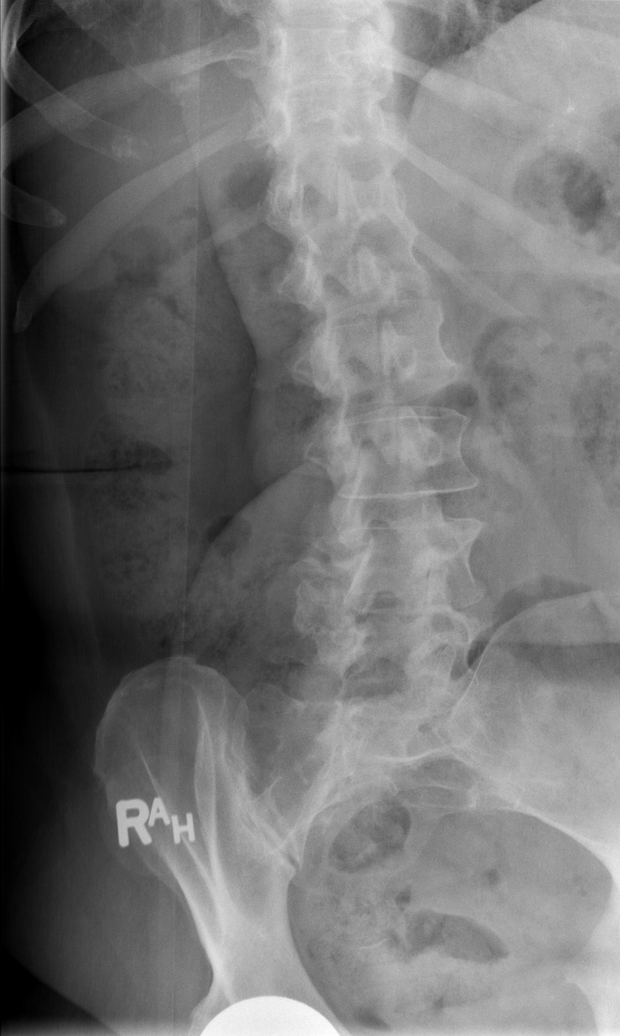

[t l-spine lat]
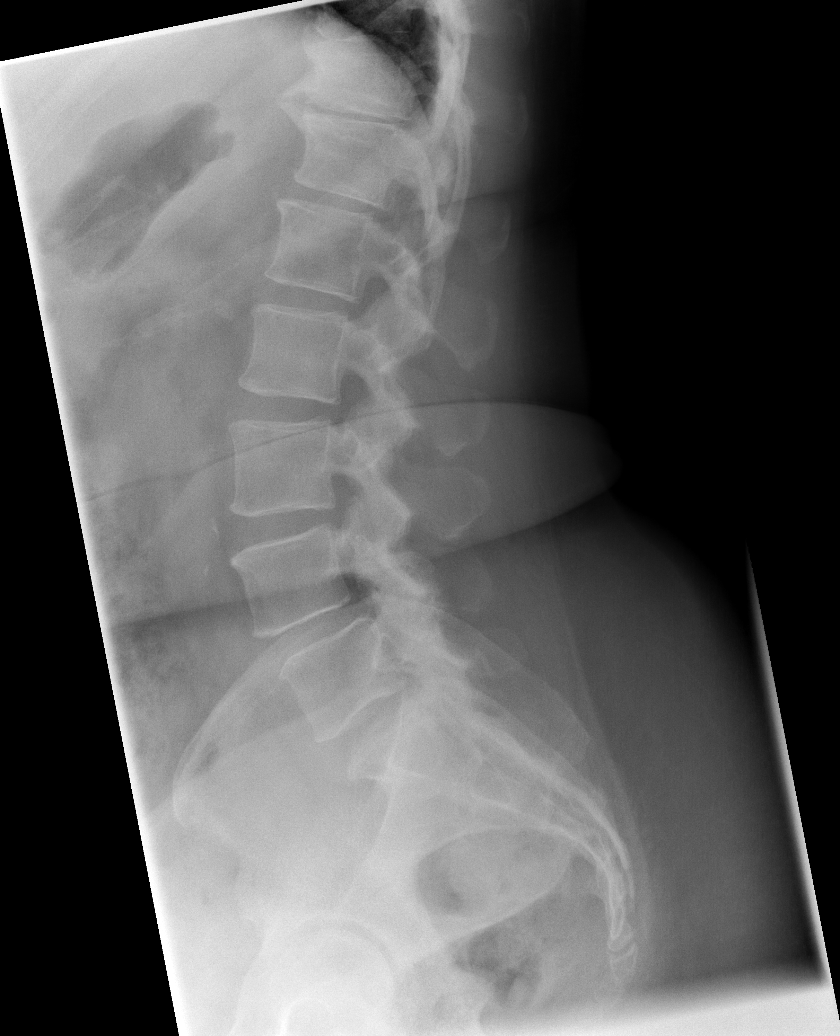

[t l-spine l5-s1 spot]
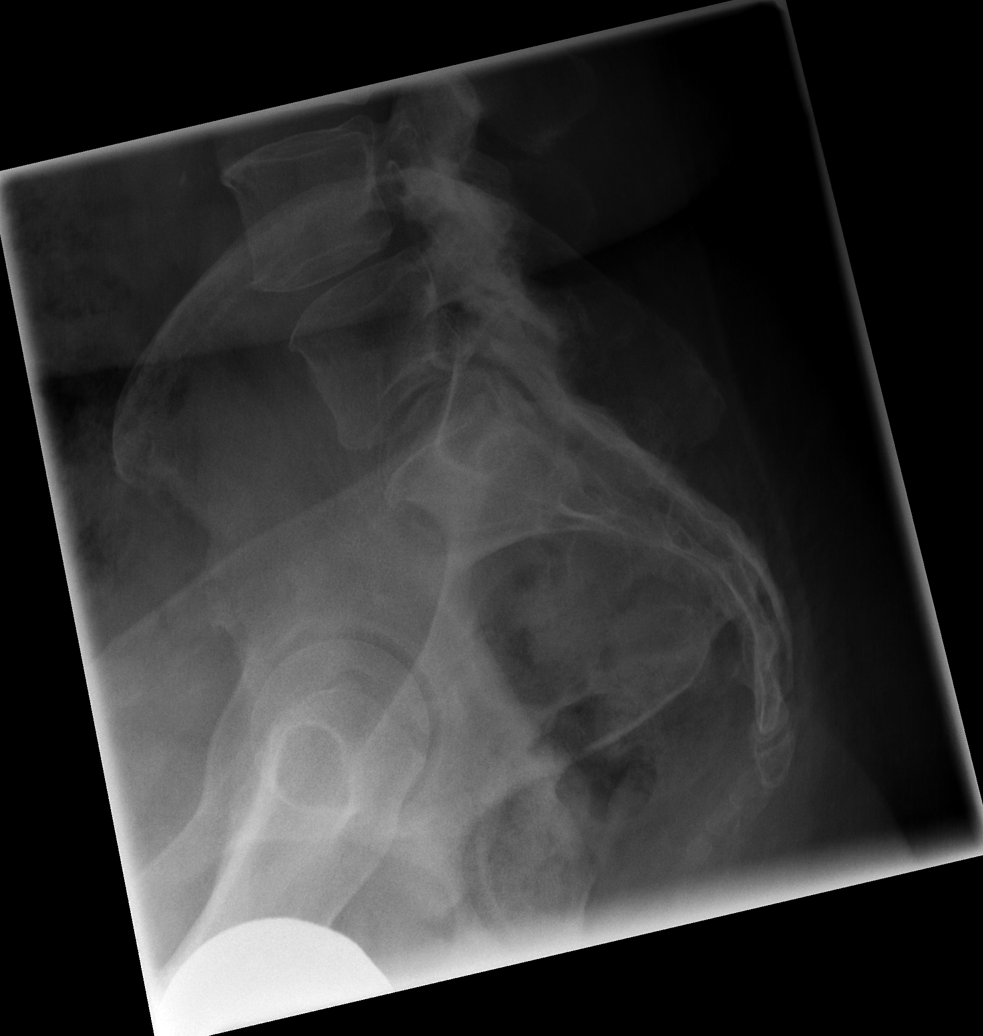

[5 of 5 positions shown; findings below may reference images not displayed]

FINDINGS: No acute displaced fracture or compression type fracture in the
lumbar spine. There is an old compression fracture of T11, which is
unchanged with approximately 20% loss of anterior vertebral body
height. Mild multilevel degenerative disc disease, most severe at
T11-T12. Multilevel facet arthropathy, most severe at L4-L5 and
L5-S1. Alignment is anatomic. No defects of the pars
interarticularis. Incidental note is made of a right hip
arthroplasty.
IMPRESSION: 1. No acute abnormality of the lumbar spine to account for the
patient's symptoms.
2. Multilevel degenerative disc disease and the rectal lumbar
spondylosis, as above.
3. Old T11 compression fracture again noted.

## 2015-04-16 ENCOUNTER — Other Ambulatory Visit: Payer: Self-pay | Admitting: Family Medicine

## 2015-04-16 ENCOUNTER — Other Ambulatory Visit: Payer: Self-pay | Admitting: Internal Medicine

## 2015-04-17 ENCOUNTER — Other Ambulatory Visit: Payer: Self-pay

## 2015-04-17 MED ORDER — LIDOCAINE-PRILOCAINE 2.5-2.5 % EX CREA: 1.0000 "application " | TOPICAL_CREAM | CUTANEOUS | Status: DC | PRN

## 2015-04-20 ENCOUNTER — Telehealth: Payer: Self-pay | Admitting: Family Medicine

## 2015-04-20 NOTE — Telephone Encounter (Signed)
Please advise 

## 2015-04-20 NOTE — Telephone Encounter (Signed)
Is it the nerve pain in her hands that is bothering her?  If yes -- refer to neuro

## 2015-04-20 NOTE — Telephone Encounter (Signed)
°  Relation to pt: self  Call back [phone]   Reason for call:  Patient states hand pain is not improving requesting referral.

## 2015-04-21 ENCOUNTER — Other Ambulatory Visit: Payer: Self-pay | Admitting: Family Medicine

## 2015-04-21 DIAGNOSIS — E1142 Type 2 diabetes mellitus with diabetic polyneuropathy: Secondary | ICD-10-CM

## 2015-04-21 MED ORDER — HYDROCODONE-ACETAMINOPHEN 5-325 MG PO TABS: 1.0000 | ORAL_TABLET | Freq: Four times a day (QID) | ORAL | Status: DC | PRN

## 2015-04-21 NOTE — Telephone Encounter (Signed)
Pt states she's in a lot of pain.  She's having nerve pain and her hands throb.  She has been taking HYDROcodone-acetaminophen (NORCO/VICODIN) 5-325 MG per tablet  2-3 times per day.  She is also taking 700 mg of gabapentin in the morning, 400 mg in evening and 700 mg at night.  She says medications have been helping, but she would like to see someone at this point.  She says that she already has an appointment with Dr. Tomi Likens on October 11th at 7:45 am.  At this time is requesting a refill on her Hydrocodone, stating this medication has been helping pain the most.  HYDROcodone-acetaminophen (NORCO/VICODIN) 5-325 MG per tablet  Last filled: 04/06/15 Amt: 49, 0 Last OV: 04/06/15 Contract on file UDS: LOW risk  Please advise.

## 2015-04-21 NOTE — Telephone Encounter (Signed)
Refill x1--- f/u neuro

## 2015-04-24 NOTE — Progress Notes (Signed)
reviewed

## 2015-04-29 ENCOUNTER — Other Ambulatory Visit: Payer: Self-pay

## 2015-04-29 MED ORDER — MONTELUKAST SODIUM 10 MG PO TABS: 10.0000 mg | ORAL_TABLET | Freq: Every day | ORAL | Status: DC

## 2015-05-05 ENCOUNTER — Other Ambulatory Visit: Payer: Self-pay | Admitting: Family Medicine

## 2015-05-05 ENCOUNTER — Encounter: Payer: Self-pay | Admitting: Neurology

## 2015-05-05 ENCOUNTER — Ambulatory Visit (INDEPENDENT_AMBULATORY_CARE_PROVIDER_SITE_OTHER): Payer: Medicare HMO | Admitting: Neurology

## 2015-05-05 ENCOUNTER — Telehealth: Payer: Self-pay | Admitting: Family Medicine

## 2015-05-05 VITALS — BP 110/80 | HR 68 | Ht 67.0 in | Wt 262.1 lb

## 2015-05-05 DIAGNOSIS — E1142 Type 2 diabetes mellitus with diabetic polyneuropathy: Secondary | ICD-10-CM

## 2015-05-05 DIAGNOSIS — I639 Cerebral infarction, unspecified: Secondary | ICD-10-CM | POA: Diagnosis not present

## 2015-05-05 DIAGNOSIS — I10 Essential (primary) hypertension: Secondary | ICD-10-CM

## 2015-05-05 DIAGNOSIS — E785 Hyperlipidemia, unspecified: Secondary | ICD-10-CM | POA: Diagnosis not present

## 2015-05-05 MED ORDER — GABAPENTIN 300 MG PO CAPS: 900.0000 mg | ORAL_CAPSULE | Freq: Three times a day (TID) | ORAL | Status: DC

## 2015-05-05 MED ORDER — HYDROCODONE-ACETAMINOPHEN 5-325 MG PO TABS: 1.0000 | ORAL_TABLET | Freq: Four times a day (QID) | ORAL | Status: DC | PRN

## 2015-05-05 NOTE — Progress Notes (Signed)
NEUROLOGY FOLLOW UP OFFICE NOTE  MAUDELL STANBROUGH [id number]  HISTORY OF PRESENT ILLNESS: Lili Harts is a 64 year old right-handed woman with type II diabetes mellitus with neuropathy, major depressive disorder, hypertension, and stomach cancer who follows up for brainstem ischemic infarct and painful diabetic polyneuropathy.  UPDATE: 2D echo from 10/08/14 showed EF 55-60% and grade 1 diastolic dysfunction but no cardiac source of emboli.  MRA of head from 10/11/14 showed no vertebro-basilar stenosis.   She takes ASA 65m daily.  She was unable to tolerate statin due to joint pain.    She is no longer dizzy.  She has had worsening painful diabetic nerve pain.  It involves the hands and feet.  She is taking gabapentin 7045mthree times daily. She noted improvement as the dose was gradually increased.  She also takes Cymbalta 6049mnd Vicodin.  She is unable to take Lyrica due to her heart.  Her recent Hgb A1c was 9.3.  HISTORY: For several months, she had recurrent episodes of spinning spells.  It would occur for just a couple of seconds, however it would happen for 2 or three months at a time and then she would be symptom-free for 2 or 3 months.  It is sometimes associated with nausea.  She denied facial droop, slurred speech, lightheadedness or focal numbness and weakness.  It would occur spontaneously and she mostly notices it while lying in bed.  One time, she fell on her face because of the dizziness.  She had an MRI of the brain with and without contrast performed on 05/03/14, which showed subacute infarcts in the pons but no metastatic disease.  Symptoms persisted, so she had another MRI of the brain with and without contrast performed on 08/09/14, which revealed no new infarcts.  Carotid doppler performed on 08/20/14 which revealed no hemodynamically significant ICA or vertebral artery stenosis.  Last Hgb A1c from November was 7.3 and trending down.  Cholesterol was 217, triglycerides 152, HDL 46.50  and LDL of 140.  She was started on ASA 26m44mily.  She previously was on simvastatin but was unable to tolerate it.  PAST MEDICAL HISTORY: Past Medical History  Diagnosis Date  . stomach ca dx'd 2010    chemo/xrt comp 05/2009  . Hypertension   . Diabetes mellitus   . Asthma   . Sleep apnea   . Degenerative joint disease   . Hyperlipidemia   . GERD (gastroesophageal reflux disease)   . Neuropathy (HCC)[place]  bilateral hands and feet    MEDICATIONS: Current Outpatient Prescriptions on File Prior to Visit  Medication Sig Dispense Refill  . albuterol (PROVENTIL HFA;VENTOLIN HFA) 108 (90 BASE) MCG/ACT inhaler Inhale 2 puffs into lungs every 8 hours prn wheezing 1 Inhaler 0  . aspirin 81 MG tablet Take 81 mg by mouth at bedtime.    . azMarland Kitchenlastine (ASTELIN) 0.1 % nasal spray Place 2 sprays into both nostrils at bedtime as needed for rhinitis. Use in each nostril as directed 30 mL 3  . azelastine (ASTELIN) 0.1 % nasal spray Place 1 spray into both nostrils 2 (two) times daily. Use in each nostril as directed 30 mL 12  . carvedilol (COREG) 12.5 MG tablet TAKE 1 TABLET BY MOUTH BY EVERY MORNING 90 tablet 1  . DULoxetine (CYMBALTA) 60 MG capsule Take 1 capsule (60 mg total) by mouth daily. 90 capsule 3  . erythromycin base (E-MYCIN) 500 MG tablet     . glimepiride (AMARYL) 4  MG tablet Take 1 tablet (4 mg total) by mouth 2 (two) times daily. **PT NEEDS FOLLOW UP APPT FOR FURTHER REFILLS*8 90 tablet 0  . hydrochlorothiazide (HYDRODIURIL) 25 MG tablet TAKE 1 TABLET (25 MG TOTAL) BY MOUTH DAILY. 90 tablet 1  . HYDROcodone-acetaminophen (NORCO/VICODIN) 5-325 MG per tablet Take 1 tablet by mouth every 6 (six) hours as needed for moderate pain. 30 tablet 0  . lidocaine-prilocaine (EMLA) cream Apply 1 application topically as needed. 30 g 5  . metaxalone (SKELAXIN) 800 MG tablet Take 1 tablet (800 mg total) by mouth 4 (four) times daily. 30 tablet 0  . metFORMIN (GLUCOPHAGE) 850 MG tablet Take 1 tablet  (850 mg total) by mouth 2 (two) times daily. 60 tablet 3  . montelukast (SINGULAIR) 10 MG tablet Take 1 tablet (10 mg total) by mouth at bedtime. 90 tablet 3  . Multiple Vitamin (MULTIVITAMIN WITH MINERALS) TABS tablet Take 1 tablet by mouth daily.    . NONFORMULARY OR COMPOUNDED ITEM Bmp, hgba1c, hep, lipid, microalbumin, ua----dx DM II uncontrolled with neuropathy and htn 1 each 0  . prochlorperazine (COMPAZINE) 10 MG tablet Take 1 tablet (10 mg total) by mouth every 6 (six) hours as needed for nausea or vomiting. 60 tablet 4  . senna-docusate (SENOKOT-S) 8.6-50 MG per tablet Take 2 tablets by mouth daily. 20 tablet 0   No current facility-administered medications on file prior to visit.    ALLERGIES: Allergies  Allergen Reactions  . Adhesive [Tape] Other (See Comments)    Burn Skin  . Codeine Other (See Comments)    paranoid  . Zocor [Simvastatin - High Dose] Other (See Comments)    Muscle spasms  . Penicillins Rash    FAMILY HISTORY: Family History  Problem Relation Age of Onset  . Cancer Mother   . Alzheimer's disease Father   . Diabetes Brother   . Diabetes Maternal Grandmother   . Cancer Maternal Grandfather     lung  . Suicidality Neg Hx   . Depression Neg Hx   . Dementia Neg Hx   . Anxiety disorder Neg Hx     SOCIAL HISTORY: Social History   Social History  . Marital Status: Divorced    Spouse Name: N/A  . Number of Children: N/A  . Years of Education: N/A   Occupational History  . Not on file.   Social History Main Topics  . Smoking status: Former Smoker -- 1.00 packs/day for 15 years    Types: Cigarettes    Start date: 09/27/1973    Quit date: 10/15/1988  . Smokeless tobacco: Never Used     Comment: quit smoking 14 years ago  . Alcohol Use: No  . Drug Use: No  . Sexual Activity: No   Other Topics Concern  . Not on file   Social History Narrative    REVIEW OF SYSTEMS: Constitutional: No fevers, chills, or sweats, no generalized fatigue,  change in appetite Eyes: No visual changes, double vision, eye pain Ear, nose and throat: No hearing loss, ear pain, nasal congestion, sore throat Cardiovascular: No chest pain, palpitations Respiratory:  No shortness of breath at rest or with exertion, wheezes GastrointestinaI: No nausea, vomiting, diarrhea, abdominal pain, fecal incontinence Genitourinary:  No dysuria, urinary retention or frequency Musculoskeletal:  No neck pain, back pain Integumentary: No rash, pruritus, skin lesions Neurological: as above Psychiatric: No depression, insomnia, anxiety Endocrine: No palpitations, fatigue, diaphoresis, mood swings, change in appetite, change in weight, increased thirst Hematologic/Lymphatic:  No anemia,  purpura, petechiae. Allergic/Immunologic: no itchy/runny eyes, nasal congestion, recent allergic reactions, rashes  PHYSICAL EXAM: Filed Vitals:   05/05/15 0738  BP: 110/80  Pulse: 68   General: No acute distress.  Patient appears well-groomed.   Head:  Normocephalic/atraumatic Eyes:  Fundoscopic exam unremarkable without vessel changes, exudates, hemorrhages or papilledema. Neck: supple, no paraspinal tenderness, full range of motion Heart:  Regular rate and rhythm Lungs:  Clear to auscultation bilaterally Back: No paraspinal tenderness Neurological Exam: alert and oriented to person, place, and time. Attention span and concentration intact, recent and remote memory intact, fund of knowledge intact.  Speech fluent and not dysarthric, language intact.  CN II-XII intact. Fundoscopic exam unremarkable without vessel changes, exudates, hemorrhages or papilledema.  Bulk and tone normal, muscle strength 5/5 throughout.  Sensation to pinprick and vibration was reduced in the feet.  Deep tendon reflexes 1+ in upper extremities, absent in lower extremities, toes downgoing.  Finger to nose and heel to shin testing intact.  Gait normal, Romberg negative.  IMPRESSION: Painful diabetic  neuropathy with uncontrolled type 2 diabetes Brainstem stroke Hyperlipidemia Morbid obesity  PLAN: 1.  Increase gabapentin to 982m three times daily. May consider increasing Cymbalta to 934mdaily 2.  ASA 8149maily for secondary stroke prevention 3.  Must optimize glycemic control, which is the primary reason for her worsening neuropathy. 4.  Blood pressure control 5.  Mediterranean diet and weight loss 6.  Ideally she should be on a statin.  LDL goal is less than 70. 7.  Follow up in 3 months.  AdaMetta ClinesO  CC:  YvoGarnet KoyanagiO

## 2015-05-05 NOTE — Telephone Encounter (Signed)
Duplicate request, already handled through my-chart.     KP

## 2015-05-05 NOTE — Telephone Encounter (Signed)
Relation to pt: self  Call back number:[phone]   Reason for call:  Patient requesting a refill HYDROcodone-acetaminophen (NORCO/VICODIN) 5-325 MG per tablet

## 2015-05-05 NOTE — Patient Instructions (Signed)
1.  Increase gabapentin to 916m three times daily 2.  Must get diabetes under control 3.  Aspirin 864maily 4.  Mediterranean diet and weight loss 5.  Follow up in 3 months.

## 2015-05-05 NOTE — Telephone Encounter (Signed)
Last seen 04/06/15 and filled 04/21/15 #30 No contract and No UDS   Please advise     KP

## 2015-05-08 ENCOUNTER — Encounter: Payer: Self-pay | Admitting: Neurology

## 2015-05-08 ENCOUNTER — Other Ambulatory Visit: Payer: Self-pay | Admitting: *Deleted

## 2015-05-08 ENCOUNTER — Ambulatory Visit: Payer: Medicare HMO | Admitting: Podiatry

## 2015-05-08 MED ORDER — GABAPENTIN 300 MG PO CAPS: 900.0000 mg | ORAL_CAPSULE | Freq: Three times a day (TID) | ORAL | Status: DC

## 2015-05-08 NOTE — Telephone Encounter (Signed)
Pt called concerning a refill of Gabapentin 333m x 3 qd/ [facility] [address]Pt call back # [phone]

## 2015-05-13 ENCOUNTER — Telehealth: Payer: Self-pay | Admitting: Family Medicine

## 2015-05-13 ENCOUNTER — Other Ambulatory Visit: Payer: Self-pay | Admitting: Family Medicine

## 2015-05-13 DIAGNOSIS — G894 Chronic pain syndrome: Secondary | ICD-10-CM

## 2015-05-13 NOTE — Telephone Encounter (Signed)
Please advise      KP 

## 2015-05-13 NOTE — Telephone Encounter (Signed)
New information routed to [name] for review and consideration.

## 2015-05-13 NOTE — Telephone Encounter (Signed)
Pt said neurology Dr. Tomi Likens increased gabapentin to 962m 3xday and taking vicodin but they are not helping. Pt wants referral to pain. I advised her Dr. LEtter Sjogrenis out of office til 05/14/15. Please f/u as needed with pt.

## 2015-05-13 NOTE — Telephone Encounter (Signed)
Caller name: Cherice Glennie   Relationship to patient: Self   Can be reached: [phone]   Reason for call: Pt called in because she want to know if she can have a referral to pain management. She says that she would prefer the location on [address] if possible.

## 2015-05-14 NOTE — Telephone Encounter (Addendum)
Patient requesting referral be placed at [facility] [address] [place], [place] [address] [phone] best [phone]

## 2015-05-14 NOTE — Telephone Encounter (Signed)
Referral is in.

## 2015-05-15 NOTE — Telephone Encounter (Signed)
Referral faxed to [facility]/awaiting appt

## 2015-05-16 ENCOUNTER — Emergency Department (HOSPITAL_BASED_OUTPATIENT_CLINIC_OR_DEPARTMENT_OTHER)
Admission: EM | Admit: 2015-05-16 | Discharge: 2015-05-16 | Disposition: A | Discharge status: Home / Self Care | Payer: Medicare HMO | Attending: Emergency Medicine | Admitting: Emergency Medicine

## 2015-05-16 ENCOUNTER — Emergency Department (HOSPITAL_BASED_OUTPATIENT_CLINIC_OR_DEPARTMENT_OTHER): Payer: Medicare HMO

## 2015-05-16 ENCOUNTER — Encounter (HOSPITAL_BASED_OUTPATIENT_CLINIC_OR_DEPARTMENT_OTHER): Payer: Self-pay | Admitting: Emergency Medicine

## 2015-05-16 DIAGNOSIS — M1990 Unspecified osteoarthritis, unspecified site: Secondary | ICD-10-CM | POA: Insufficient documentation

## 2015-05-16 DIAGNOSIS — Z87891 Personal history of nicotine dependence: Secondary | ICD-10-CM | POA: Insufficient documentation

## 2015-05-16 DIAGNOSIS — J45901 Unspecified asthma with (acute) exacerbation: Secondary | ICD-10-CM | POA: Diagnosis not present

## 2015-05-16 DIAGNOSIS — Z7982 Long term (current) use of aspirin: Secondary | ICD-10-CM | POA: Insufficient documentation

## 2015-05-16 DIAGNOSIS — Z79899 Other long term (current) drug therapy: Secondary | ICD-10-CM | POA: Insufficient documentation

## 2015-05-16 DIAGNOSIS — Z880 Allergy status to penicillin: Secondary | ICD-10-CM | POA: Diagnosis not present

## 2015-05-16 DIAGNOSIS — K219 Gastro-esophageal reflux disease without esophagitis: Secondary | ICD-10-CM | POA: Diagnosis not present

## 2015-05-16 DIAGNOSIS — R079 Chest pain, unspecified: Secondary | ICD-10-CM

## 2015-05-16 DIAGNOSIS — G629 Polyneuropathy, unspecified: Secondary | ICD-10-CM | POA: Diagnosis not present

## 2015-05-16 DIAGNOSIS — I10 Essential (primary) hypertension: Secondary | ICD-10-CM | POA: Diagnosis not present

## 2015-05-16 DIAGNOSIS — E119 Type 2 diabetes mellitus without complications: Secondary | ICD-10-CM | POA: Diagnosis not present

## 2015-05-16 DIAGNOSIS — Z7984 Long term (current) use of oral hypoglycemic drugs: Secondary | ICD-10-CM | POA: Diagnosis not present

## 2015-05-16 DIAGNOSIS — Z85028 Personal history of other malignant neoplasm of stomach: Secondary | ICD-10-CM | POA: Diagnosis not present

## 2015-05-16 LAB — CBC WITH DIFFERENTIAL/PLATELET
BASOS ABS: 0 10*3/uL (ref 0.0–0.1)
Basophils Relative: 0 %
EOS PCT: 5 %
Eosinophils Absolute: 0.4 10*3/uL (ref 0.0–0.7)
HCT: 35.2 % — ABNORMAL LOW (ref 36.0–46.0)
Hemoglobin: 11.6 g/dL — ABNORMAL LOW (ref 12.0–15.0)
LYMPHS ABS: 2.8 10*3/uL (ref 0.7–4.0)
LYMPHS PCT: 35 %
MCH: 29.3 pg (ref 26.0–34.0)
MCHC: 33 g/dL (ref 30.0–36.0)
MCV: 88.9 fL (ref 78.0–100.0)
MONO ABS: 0.8 10*3/uL (ref 0.1–1.0)
MONOS PCT: 10 %
Neutro Abs: 4 10*3/uL (ref 1.7–7.7)
Neutrophils Relative %: 50 %
PLATELETS: 370 10*3/uL (ref 150–400)
RBC: 3.96 MIL/uL (ref 3.87–5.11)
RDW: 14.5 % (ref 11.5–15.5)
WBC: 7.9 10*3/uL (ref 4.0–10.5)

## 2015-05-16 LAB — COMPREHENSIVE METABOLIC PANEL
ALT: 14 U/L (ref 14–54)
ANION GAP: 5 (ref 5–15)
AST: 20 U/L (ref 15–41)
Albumin: 3.6 g/dL (ref 3.5–5.0)
Alkaline Phosphatase: 54 U/L (ref 38–126)
BUN: 18 mg/dL (ref 6–20)
CHLORIDE: 104 mmol/L (ref 101–111)
CO2: 29 mmol/L (ref 22–32)
Calcium: 8.9 mg/dL (ref 8.9–10.3)
Creatinine, Ser: 1.03 mg/dL — ABNORMAL HIGH (ref 0.44–1.00)
GFR, EST NON AFRICAN AMERICAN: 57 mL/min — AB (ref >60)
Glucose, Bld: 217 mg/dL — ABNORMAL HIGH (ref 65–99)
POTASSIUM: 4.6 mmol/L (ref 3.5–5.1)
Sodium: 138 mmol/L (ref 135–145)
TOTAL PROTEIN: 7 g/dL (ref 6.5–8.1)
Total Bilirubin: 0.4 mg/dL (ref 0.3–1.2)

## 2015-05-16 LAB — TROPONIN I

## 2015-05-16 LAB — LIPASE, BLOOD: LIPASE: 18 U/L (ref 11–51)

## 2015-05-16 MED ORDER — ASPIRIN 81 MG PO CHEW
324.0000 mg | CHEWABLE_TABLET | Freq: Once | ORAL | Status: AC
  Administered 2015-05-16: 324 mg via ORAL
  Filled 2015-05-16: qty 4

## 2015-05-16 MED ORDER — GABAPENTIN 300 MG PO CAPS
300.0000 mg | ORAL_CAPSULE | Freq: Once | ORAL | Status: AC
  Administered 2015-05-16: 300 mg via ORAL
  Filled 2015-05-16: qty 1

## 2015-05-16 MED ORDER — PANTOPRAZOLE SODIUM 40 MG IV SOLR
40.0000 mg | Freq: Once | INTRAVENOUS | Status: AC
Start: 2015-05-16 — End: 2015-05-16
  Administered 2015-05-16: 40 mg via INTRAVENOUS
  Filled 2015-05-16: qty 40

## 2015-05-16 MED ORDER — HYDROCODONE-ACETAMINOPHEN 5-325 MG PO TABS
1.0000 | ORAL_TABLET | Freq: Once | ORAL | Status: AC
  Administered 2015-05-16: 1 via ORAL
  Filled 2015-05-16: qty 1

## 2015-05-16 MED ORDER — NITROGLYCERIN 0.4 MG SL SUBL: 0.4000 mg | SUBLINGUAL_TABLET | SUBLINGUAL | Status: DC | PRN

## 2015-05-16 MED ORDER — FAMOTIDINE 20 MG PO TABS: 20.0000 mg | ORAL_TABLET | Freq: Two times a day (BID) | ORAL | Status: DC

## 2015-05-16 MED ORDER — SODIUM CHLORIDE 0.9 % IV SOLN
INTRAVENOUS | Status: DC
  Administered 2015-05-16: 21:00:00 via INTRAVENOUS

## 2015-05-16 NOTE — ED Notes (Addendum)
C/o bilateral hand pain, (denies: CP, sob, cough, congestion, cold sx, fever, nvd or dizziness). Admits to vague sx of indigestion. And recent vague throat sx.

## 2015-05-16 NOTE — ED Notes (Signed)
Pt states that she feels like her food is not going through her digestive tract, reports severe indigestion, reports history of stomach ca

## 2015-05-16 NOTE — ED Provider Notes (Signed)
CSN: [id number]     Arrival date & time 05/16/15  1903 History  By signing my name below, I, [name], attest that this documentation has been prepared under the direction and in the presence of Fredia Sorrow, MD. Electronically Signed: Stephania Jefferson, ED Scribe. 05/16/2015. 9:45 PM.   Chief Complaint  Patient presents with  . Abdominal Pain   Patient is a 64 y.o. female presenting with chest pain. The history is provided by the patient. No language interpreter was used.  Chest Pain Pain location:  L chest Pain quality: aching   Pain radiates to:  Mid back Pain radiates to the back: yes   Pain severity:  Severe Onset quality:  Gradual Duration:  2 days Timing:  Constant Progression:  Unchanged Chronicity:  New Relieved by:  None tried Worsened by:  Nothing tried Ineffective treatments:  None tried Associated symptoms: back pain (radiating from chest), nausea and shortness of breath   Associated symptoms: no abdominal pain, no cough, no fever, no headache and not vomiting   Risk factors: diabetes mellitus, high cholesterol and hypertension   Risk factors: not female     HPI Comments: [name] is a 64 y.o. female with a history of HTN, HLD, DM, and GERD, who presents to the Emergency Department complaining of the sensation that her food is stuck in her central chest, as well as 8/10 pain in her left chest radiating to her mid back, with onset 2 days ago. Patient complains of associated nausea, SOB, and headaches. She states liquids are able to go down. She denies abdominal pain, vomiting, diarrhea, fevers, chills, visual changes, cough, rhinorrhea, sore throat, dysuria, leg swelling, bleeding easily, rash, or headache.  Patient notes a history of neuropathy in her hands and states she missed taking her gabapentin as scheduled 40 minutes ago.   Past Medical History  Diagnosis Date  . stomach ca dx'd 2010    chemo/xrt comp 05/2009  . Hypertension   . Diabetes mellitus   . Asthma    . Sleep apnea   . Degenerative joint disease   . Hyperlipidemia   . GERD (gastroesophageal reflux disease)   . Neuropathy ([place])     bilateral hands and feet   Past Surgical History  Procedure Laterality Date  . Total knee arthroplasty      Left  . Total hip arthroplasty      Right  . Shoulder surgery      Left  . Appendectomy    . Cesarean section    . Hernia repair    . Colon surgery    . Esophagogastroduodenoscopy N/A 10/15/2012    Procedure: ESOPHAGOGASTRODUODENOSCOPY (EGD);  Surgeon: Lear Ng, MD;  Location: [facility];  Service: Endoscopy;  Laterality: N/A;   Family History  Problem Relation Age of Onset  . Cancer Mother   . Alzheimer's disease Father   . Diabetes Brother   . Diabetes Maternal Grandmother   . Cancer Maternal Grandfather     lung  . Suicidality Neg Hx   . Depression Neg Hx   . Dementia Neg Hx   . Anxiety disorder Neg Hx    Social History  Substance Use Topics  . Smoking status: Former Smoker -- 1.00 packs/day for 15 years    Types: Cigarettes    Start date: 09/27/1973    Quit date: 10/15/1988  . Smokeless tobacco: Never Used     Comment: quit smoking 14 years ago  . Alcohol Use: No  OB History    Gravida Para Term Preterm AB TAB SAB Ectopic Multiple Living   4 4        4      Review of Systems  Constitutional: Negative for fever and chills.  HENT: Negative for rhinorrhea and sore throat.   Eyes: Negative for visual disturbance.  Respiratory: Positive for shortness of breath. Negative for cough.   Cardiovascular: Positive for chest pain. Negative for leg swelling.  Gastrointestinal: Positive for nausea. Negative for vomiting, abdominal pain and diarrhea.  Genitourinary: Negative for dysuria.  Musculoskeletal: Positive for back pain (radiating from chest).  Skin: Negative for rash.  Neurological: Negative for headaches.  Hematological: Does not bruise/bleed easily.   Allergies  Adhesive; Codeine; Zocor; and  Penicillins  Home Medications   Prior to Admission medications   Medication Sig Start Date End Date Taking? Authorizing Provider  aspirin 81 MG tablet Take 81 mg by mouth at bedtime.   Yes Historical Provider, MD  DULoxetine (CYMBALTA) 60 MG capsule Take 1 capsule (60 mg total) by mouth daily. 02/17/15  Yes Yvonne R Lowne, DO  gabapentin (NEURONTIN) 300 MG capsule Take 3 capsules (900 mg total) by mouth 3 (three) times daily. 05/08/15  Yes Adam Telford Nab, DO  glimepiride (AMARYL) 4 MG tablet Take 1 tablet (4 mg total) by mouth 2 (two) times daily. **PT NEEDS FOLLOW UP APPT FOR FURTHER REFILLS*8 04/16/15  Yes Philemon Kingdom, MD  hydrochlorothiazide (HYDRODIURIL) 25 MG tablet TAKE 1 TABLET (25 MG TOTAL) BY MOUTH DAILY. 04/16/15  Yes Rosalita Chessman, DO  HYDROcodone-acetaminophen (NORCO/VICODIN) 5-325 MG tablet Take 1 tablet by mouth every 6 (six) hours as needed for moderate pain. 05/05/15  Yes Alferd Apa Lowne, DO  lidocaine-prilocaine (EMLA) cream Apply 1 application topically as needed. 04/17/15  Yes Volanda Napoleon, MD  metFORMIN (GLUCOPHAGE) 850 MG tablet Take 1 tablet (850 mg total) by mouth 2 (two) times daily. 04/06/15  Yes Yvonne R Lowne, DO  montelukast (SINGULAIR) 10 MG tablet Take 1 tablet (10 mg total) by mouth at bedtime. 04/29/15  Yes Yvonne R Lowne, DO  azelastine (ASTELIN) 0.1 % nasal spray Place 1 spray into both nostrils 2 (two) times daily. Use in each nostril as directed 01/13/15   Rosalita Chessman, DO  erythromycin base (E-MYCIN) 500 MG tablet  02/17/15   Historical Provider, MD  famotidine (PEPCID) 20 MG tablet Take 1 tablet (20 mg total) by mouth 2 (two) times daily. 05/16/15   Fredia Sorrow, MD  metaxalone (SKELAXIN) 800 MG tablet Take 1 tablet (800 mg total) by mouth 4 (four) times daily. 04/06/15   Rosalita Chessman, DO  Multiple Vitamin (MULTIVITAMIN WITH MINERALS) TABS tablet Take 1 tablet by mouth daily.    Historical Provider, MD   BP 123/62 mmHg  Pulse 68  Temp(Src) 98.3 F  (36.8 C) (Oral)  Resp 11  Ht 5' 7"  (1.702 m)  Wt 250 lb (113.399 kg)  BMI 39.15 kg/m2  SpO2 99% Physical Exam  Constitutional: She is oriented to person, place, and time. She appears well-developed and well-nourished. No distress.  HENT:  Head: Normocephalic and atraumatic.  Mouth/Throat: Oropharynx is clear and moist.  Mucous membranes are moist.   Eyes: Conjunctivae and EOM are normal. Pupils are equal, round, and reactive to light. No scleral icterus.  Pupils are normal. Sclera is clear. Eyes track normal.  Neck: Neck supple. No tracheal deviation present.  Cardiovascular: Normal rate, regular rhythm and normal heart sounds.   Pulmonary/Chest: Effort  normal and breath sounds normal. No respiratory distress. She has no wheezes. She has no rales. She exhibits no tenderness.  Lungs are clear to auscultation bilaterally.   Abdominal: Soft. Bowel sounds are normal. There is no tenderness.  Musculoskeletal: Normal range of motion. She exhibits no edema.  No BLE swelling.  Neurological: She is alert and oriented to person, place, and time.  Skin: Skin is warm and dry.  Psychiatric: She has a normal mood and affect. Her behavior is normal.  Nursing note and vitals reviewed.   ED Course  Procedures (including critical care time)  DIAGNOSTIC STUDIES: Oxygen Saturation is 97% on RA, normal by my interpretation.    COORDINATION OF CARE: 8:45 PM - Discussed treatment plan with pt at bedside which includes diagnostic testing. Pt verbalized understanding and agreed to plan.   Labs Review Labs Reviewed  CBC WITH DIFFERENTIAL/PLATELET - Abnormal; Notable for the following:    Hemoglobin 11.6 (*)    HCT 35.2 (*)    All other components within normal limits  COMPREHENSIVE METABOLIC PANEL - Abnormal; Notable for the following:    Glucose, Bld 217 (*)    Creatinine, Ser 1.03 (*)    GFR calc non Af Amer 57 (*)    All other components within normal limits  LIPASE, BLOOD  TROPONIN I    Results for orders placed or performed during the hospital encounter of 05/16/15  CBC with Differential  Result Value Ref Range   WBC 7.9 4.0 - 10.5 K/uL   RBC 3.96 3.87 - 5.11 MIL/uL   Hemoglobin 11.6 (L) 12.0 - 15.0 g/dL   HCT 35.2 (L) 36.0 - 46.0 %   MCV 88.9 78.0 - 100.0 fL   MCH 29.3 26.0 - 34.0 pg   MCHC 33.0 30.0 - 36.0 g/dL   RDW 14.5 11.5 - 15.5 %   Platelets 370 150 - 400 K/uL   Neutrophils Relative % 50 %   Neutro Abs 4.0 1.7 - 7.7 K/uL   Lymphocytes Relative 35 %   Lymphs Abs 2.8 0.7 - 4.0 K/uL   Monocytes Relative 10 %   Monocytes Absolute 0.8 0.1 - 1.0 K/uL   Eosinophils Relative 5 %   Eosinophils Absolute 0.4 0.0 - 0.7 K/uL   Basophils Relative 0 %   Basophils Absolute 0.0 0.0 - 0.1 K/uL  Comprehensive metabolic panel  Result Value Ref Range   Sodium 138 135 - 145 mmol/L   Potassium 4.6 3.5 - 5.1 mmol/L   Chloride 104 101 - 111 mmol/L   CO2 29 22 - 32 mmol/L   Glucose, Bld 217 (H) 65 - 99 mg/dL   BUN 18 6 - 20 mg/dL   Creatinine, Ser 1.03 (H) 0.44 - 1.00 mg/dL   Calcium 8.9 8.9 - 10.3 mg/dL   Total Protein 7.0 6.5 - 8.1 g/dL   Albumin 3.6 3.5 - 5.0 g/dL   AST 20 15 - 41 U/L   ALT 14 14 - 54 U/L   Alkaline Phosphatase 54 38 - 126 U/L   Total Bilirubin 0.4 0.3 - 1.2 mg/dL   GFR calc non Af Amer 57 (L) >60 mL/min   GFR calc Af Amer >60 >60 mL/min   Anion gap 5 5 - 15  Lipase, blood  Result Value Ref Range   Lipase 18 11 - 51 U/L  Troponin I  Result Value Ref Range   Troponin I <0.03 <0.031 ng/mL     Imaging Review Dg Chest 2 View  05/16/2015  CLINICAL DATA:  64 year old female with acute left chest pain for 2 days with nausea, shortness of breath and headaches. EXAM: CHEST  2 VIEW COMPARISON:  10/15/2013 and prior radiographs FINDINGS: The cardiomediastinal silhouette is unremarkable. A right Port-A-Cath is again identified. There is no evidence of focal airspace disease, pulmonary edema, suspicious pulmonary nodule/mass, pleural effusion, or  pneumothorax. No acute bony abnormalities are identified. IMPRESSION: No active cardiopulmonary disease. Electronically Signed   By: Margarette Canada M.D.   On: 05/16/2015 21:51   I have personally reviewed and evaluated these images and lab results as part of my medical decision-making.   EKG Interpretation   Date/Time:  Saturday May 16 2015 20:55:23 EDT Ventricular Rate:  67 PR Interval:  160 QRS Duration: 86 QT Interval:  382 QTC Calculation: 403 R Axis:     Text Interpretation:  Normal sinus rhythm Normal ECG Confirmed by  Aanya Haynes  MD, Nickey Kloepfer ([id number]) on 05/16/2015 9:02:56 PM      MDM   Final diagnoses:  Chest pain, unspecified chest pain type  Gastroesophageal reflux disease, esophagitis presence not specified   Patient with a history of left-sided substernal chest pain nonstop for 2 days. Patient's had some feelings of reflux and some burning sensation in the mouth. Patient thought that there was food stuck and not getting down but today she's been able to eat and drink liquids fine. Symptoms seem to be more consistent with reflux. Cardiac workup without any acute findings. EKG without any acute changes chest x-rays negative for pneumonia pneumothorax or pulmonary edema. Troponins also negative. Liver function test are negative. Patient received Protonix here with some improvement. Patient will be continued with Pepcid. Patient also given aspirin and nitroglycerin without any significant changes. Based on the duration of the chest pain now if this was cardiac in nature troponin should be elevated. There's been no significant worsening of the pain of late.  I, Kaydee Magel, personally performed the services described in this documentation. All medical record entries made by the scribe were at my direction and in my presence.  I have reviewed the chart and discharge instructions and agree that the record reflects my personal performance and is accurate and complete. Dafna Romo.   05/16/2015. 11:11 PM.       Fredia Sorrow, MD 05/16/15 [phone]

## 2015-05-16 NOTE — ED Notes (Signed)
Pt reclining supine HOB 30 degrees, alert, NAD, calm, interactive, no dyspnea noted, EDP at [facility].

## 2015-05-16 NOTE — Discharge Instructions (Signed)
Return for any new or worse symptoms. Take the Pepcid as directed. Make an appointment to follow-up with your primary care doctor.

## 2015-05-16 NOTE — ED Notes (Signed)
Pt talking on phone, remains alert, NAD, calm, interactive, pt updated, EDP to see/aware, pending re-eval.

## 2015-05-16 NOTE — ED Notes (Signed)
Bothered by chewable ASA, frequent clearing of throat and sighing, states, ASA burns throat, remains calm, NAD, no dyspnea or dysphagia noted.

## 2015-05-16 NOTE — ED Notes (Signed)
EDP in to speak with pt at time of d/c.

## 2015-05-22 ENCOUNTER — Telehealth: Payer: Self-pay | Admitting: Family Medicine

## 2015-05-22 DIAGNOSIS — E1142 Type 2 diabetes mellitus with diabetic polyneuropathy: Secondary | ICD-10-CM

## 2015-05-22 MED ORDER — HYDROCODONE-ACETAMINOPHEN 5-325 MG PO TABS: 1.0000 | ORAL_TABLET | Freq: Four times a day (QID) | ORAL | Status: DC | PRN

## 2015-05-22 NOTE — Telephone Encounter (Signed)
Pt calling for refill on hydrocodone needed for neuropathy of hands and feet. Pt is out. Call when Rx is ready [phone].

## 2015-05-22 NOTE — Telephone Encounter (Signed)
Refill x1 

## 2015-05-22 NOTE — Telephone Encounter (Signed)
VM left advising the Rx is ready for pick up.      KP

## 2015-05-22 NOTE — Telephone Encounter (Signed)
Last seen 04/06/15 and filled 10/11/6 #30 UDS 03/17/15 low risk  Please advise    KP

## 2015-05-28 ENCOUNTER — Ambulatory Visit: Payer: Medicare HMO | Admitting: Podiatry

## 2015-06-01 ENCOUNTER — Ambulatory Visit: Payer: Medicare HMO | Admitting: Podiatry

## 2015-06-06 ENCOUNTER — Other Ambulatory Visit: Payer: Self-pay | Admitting: Internal Medicine

## 2015-06-08 ENCOUNTER — Telehealth: Payer: Self-pay | Admitting: *Deleted

## 2015-06-08 ENCOUNTER — Telehealth: Payer: Self-pay | Admitting: Family Medicine

## 2015-06-08 NOTE — Telephone Encounter (Signed)
Patient would like Dr Marin Olp to excuse her from [employer]. Spoke to Dr Marin Olp who states he will only excuse patients from [facility] who are under active treatment. Notified patient. She understood.

## 2015-06-08 NOTE — Telephone Encounter (Signed)
We need the letter that was sent

## 2015-06-08 NOTE — Telephone Encounter (Signed)
Caller name: [name]  Relationship to patient: Self   Can be reached: [phone]  Reason for call: Pt says that she received a notice for jury duty.  Pt says that she is suffering from Neuropathy and would like to know if she can get a note to be excused from jury duty.

## 2015-06-08 NOTE — Telephone Encounter (Signed)
Please advise      KP 

## 2015-06-08 NOTE — Telephone Encounter (Signed)
Detailed message left advising to drop of the juror summons letter, so we can complete the request do delay jury duty.    KP

## 2015-06-09 ENCOUNTER — Ambulatory Visit (INDEPENDENT_AMBULATORY_CARE_PROVIDER_SITE_OTHER): Payer: Medicare HMO | Admitting: Family Medicine

## 2015-06-09 ENCOUNTER — Encounter: Payer: Self-pay | Admitting: Family Medicine

## 2015-06-09 VITALS — BP 124/80 | Temp 98.1°F | Resp 16 | Wt 262.8 lb

## 2015-06-09 DIAGNOSIS — F329 Major depressive disorder, single episode, unspecified: Secondary | ICD-10-CM

## 2015-06-09 DIAGNOSIS — E1142 Type 2 diabetes mellitus with diabetic polyneuropathy: Secondary | ICD-10-CM | POA: Diagnosis not present

## 2015-06-09 DIAGNOSIS — F32A Depression, unspecified: Secondary | ICD-10-CM

## 2015-06-09 DIAGNOSIS — M79661 Pain in right lower leg: Secondary | ICD-10-CM

## 2015-06-09 MED ORDER — HYDROCODONE-ACETAMINOPHEN 7.5-300 MG PO TABS: ORAL_TABLET | ORAL | Status: DC

## 2015-06-09 MED ORDER — DULOXETINE HCL 60 MG PO CPEP: 60.0000 mg | ORAL_CAPSULE | Freq: Two times a day (BID) | ORAL | Status: DC

## 2015-06-09 NOTE — Progress Notes (Signed)
Pre visit review using our clinic review tool, if applicable. No additional management support is needed unless otherwise documented below in the visit note. 

## 2015-06-09 NOTE — Progress Notes (Signed)
Patient ID: [name], female    DOB: 1951/05/15  Age: 64 y.o. MRN: [id number]    Subjective:  Subjective HPI [name] presents for worsening pain --- neuropathy.  Pt has an appointment with pain management next month but her pain meds now are not working.   Pt also c/o pain behind r knee and low calf.  No swelling.   Review of Systems  Constitutional: Negative for diaphoresis, appetite change, fatigue and unexpected weight change.  Eyes: Negative for pain, redness and visual disturbance.  Respiratory: Negative for cough, chest tightness, shortness of breath and wheezing.   Cardiovascular: Negative for chest pain, palpitations and leg swelling.  Endocrine: Negative for cold intolerance, heat intolerance, polydipsia, polyphagia and polyuria.  Genitourinary: Negative for dysuria, frequency and difficulty urinating.  Neurological: Positive for numbness. Negative for dizziness, light-headedness and headaches.    History Past Medical History  Diagnosis Date  . stomach ca dx'd 2010    chemo/xrt comp 05/2009  . Hypertension   . Diabetes mellitus   . Asthma   . Sleep apnea   . Degenerative joint disease   . Hyperlipidemia   . GERD (gastroesophageal reflux disease)   . Neuropathy ([place])     bilateral hands and feet    She has past surgical history that includes Total knee arthroplasty; Total hip arthroplasty; Shoulder surgery; Appendectomy; Cesarean section; Hernia repair; Colon surgery; and Esophagogastroduodenoscopy (N/A, 10/15/2012).   Her family history includes Alzheimer's disease in her father; Cancer in her maternal grandfather and mother; Diabetes in her brother and maternal grandmother. There is no history of Suicidality, Depression, Dementia, or Anxiety disorder.She reports that she quit smoking about 26 years ago. Her smoking use included Cigarettes. She started smoking about 41 years ago. She has a 15 pack-year smoking history. She has never used smokeless tobacco. She  reports that she does not drink alcohol or use illicit drugs.  Current Outpatient Prescriptions on File Prior to Visit  Medication Sig Dispense Refill  . aspirin 81 MG tablet Take 81 mg by mouth at bedtime.    Marland Kitchen azelastine (ASTELIN) 0.1 % nasal spray Place 1 spray into both nostrils 2 (two) times daily. Use in each nostril as directed 30 mL 12  . erythromycin base (E-MYCIN) 500 MG tablet     . famotidine (PEPCID) 20 MG tablet Take 1 tablet (20 mg total) by mouth 2 (two) times daily. 30 tablet 0  . gabapentin (NEURONTIN) 300 MG capsule Take 3 capsules (900 mg total) by mouth 3 (three) times daily. 270 capsule 5  . glimepiride (AMARYL) 4 MG tablet Take 1 tablet (4 mg total) by mouth 2 (two) times daily. **PT NEEDS FOLLOW UP APPT WITH DR GHERGHE** 90 tablet 0  . hydrochlorothiazide (HYDRODIURIL) 25 MG tablet TAKE 1 TABLET (25 MG TOTAL) BY MOUTH DAILY. 90 tablet 1  . lidocaine-prilocaine (EMLA) cream Apply 1 application topically as needed. 30 g 5  . metaxalone (SKELAXIN) 800 MG tablet Take 1 tablet (800 mg total) by mouth 4 (four) times daily. 30 tablet 0  . metFORMIN (GLUCOPHAGE) 850 MG tablet Take 1 tablet (850 mg total) by mouth 2 (two) times daily. 60 tablet 3  . montelukast (SINGULAIR) 10 MG tablet Take 1 tablet (10 mg total) by mouth at bedtime. 90 tablet 3  . Multiple Vitamin (MULTIVITAMIN WITH MINERALS) TABS tablet Take 1 tablet by mouth daily.     No current facility-administered medications on file prior to visit.     Objective:  Objective Physical Exam  Constitutional: She is oriented to person, place, and time. She appears well-developed and well-nourished.  HENT:  Head: Normocephalic and atraumatic.  Eyes: Conjunctivae and EOM are normal.  Neck: Normal range of motion. Neck supple. No JVD present. Carotid bruit is not present. No thyromegaly present.  Cardiovascular: Normal rate, regular rhythm and normal heart sounds.   No murmur heard. Pulmonary/Chest: Effort normal and  breath sounds normal. No respiratory distress. She has no wheezes. She has no rales. She exhibits no tenderness.  Musculoskeletal: She exhibits tenderness. She exhibits no edema.       Right lower leg: She exhibits tenderness. She exhibits no swelling and no edema.       Legs: Neurological: She is alert and oriented to person, place, and time. She has normal strength. A sensory deficit is present.  Psychiatric: She has a normal mood and affect.  Nursing note and vitals reviewed.  BP 124/80 mmHg  Temp(Src) 98.1 F (36.7 C) (Oral)  Resp 16  Wt 262 lb 12.8 oz (119.205 kg) Wt Readings from Last 3 Encounters:  06/09/15 262 lb 12.8 oz (119.205 kg)  05/16/15 250 lb (113.399 kg)  05/05/15 262 lb 2 oz (118.899 kg)     Lab Results  Component Value Date   WBC 7.9 05/16/2015   HGB 11.6* 05/16/2015   HCT 35.2* 05/16/2015   PLT 370 05/16/2015   GLUCOSE 217* 05/16/2015   CHOL 194 04/06/2015   TRIG 170* 04/06/2015   HDL 48 04/06/2015   LDLCALC 112 04/06/2015   ALT 14 05/16/2015   AST 20 05/16/2015   NA 138 05/16/2015   K 4.6 05/16/2015   CL 104 05/16/2015   CREATININE 1.03* 05/16/2015   BUN 18 05/16/2015   CO2 29 05/16/2015   TSH 2.435 07/31/2014   INR 0.98 11/05/2010   HGBA1C 9.3* 04/06/2015   MICROALBUR 0.3 04/06/2015    Dg Chest 2 View  05/16/2015  CLINICAL DATA:  64 year old female with acute left chest pain for 2 days with nausea, shortness of breath and headaches. EXAM: CHEST  2 VIEW COMPARISON:  10/15/2013 and prior radiographs FINDINGS: The cardiomediastinal silhouette is unremarkable. A right Port-A-Cath is again identified. There is no evidence of focal airspace disease, pulmonary edema, suspicious pulmonary nodule/mass, pleural effusion, or pneumothorax. No acute bony abnormalities are identified. IMPRESSION: No active cardiopulmonary disease. Electronically Signed   By: Margarette Canada M.D.   On: 05/16/2015 21:51     Assessment & Plan:  Plan I have discontinued Ms. Mcclish's  HYDROcodone-acetaminophen. I have also changed her DULoxetine. Additionally, I am having her start on Hydrocodone-Acetaminophen. Lastly, I am having her maintain her multivitamin with minerals, azelastine, aspirin, erythromycin base, metFORMIN, metaxalone, hydrochlorothiazide, lidocaine-prilocaine, montelukast, gabapentin, famotidine, and glimepiride.  Meds ordered this encounter  Medications  . DULoxetine (CYMBALTA) 60 MG capsule    Sig: Take 1 capsule (60 mg total) by mouth 2 (two) times daily.    Dispense:  180 capsule    Refill:  3  . Hydrocodone-Acetaminophen 7.5-300 MG TABS    Sig: 1 po q6h prn pain    Dispense:  120 each    Refill:  0    Problem List Items Addressed This Visit    Diabetic polyneuropathy associated with type 2 diabetes mellitus ([place]) - Primary   Relevant Medications   DULoxetine (CYMBALTA) 60 MG capsule   Hydrocodone-Acetaminophen 7.5-300 MG TABS    Other Visit Diagnoses    Depression  Relevant Medications    DULoxetine (CYMBALTA) 60 MG capsule    Right calf pain        Relevant Orders    [place] Extrem Low Right Comp       Follow-up: Return if symptoms worsen or fail to improve.  Garnet Koyanagi, DO

## 2015-06-09 NOTE — Patient Instructions (Signed)
Neuropathic Pain Neuropathic pain is pain caused by damage to the nerves that are responsible for certain sensations in your body (sensory nerves). The pain can be caused by damage to:   The sensory nerves that send signals to your spinal cord and brain (peripheral nervous system).  The sensory nerves in your brain or spinal cord (central nervous system). Neuropathic pain can make you more sensitive to pain. What would be a minor sensation for most people may feel very painful if you have neuropathic pain. This is usually a long-term condition that can be difficult to treat. The type of pain can differ from person to person. It may start suddenly (acute), or it may develop slowly and last for a long time (chronic). Neuropathic pain may come and go as damaged nerves heal or may stay at the same level for years. It often causes emotional distress, loss of sleep, and a lower quality of life. CAUSES  The most common cause of damage to a sensory nerve is diabetes. Many other diseases and conditions can also cause neuropathic pain. Causes of neuropathic pain can be classified as:  Toxic. Many drugs and chemicals can cause toxic damage. The most common cause of toxic neuropathic pain is damage from drug treatment for cancer (chemotherapy).  Metabolic. This type of pain can happen when a disease causes imbalances that damage nerves. Diabetes is the most common of these diseases. Vitamin B deficiency caused by long-term alcohol abuse is another common cause.  Traumatic. Any injury that cuts, crushes, or stretches a nerve can cause damage and pain. A common example is feeling pain after losing an arm or leg (phantom limb pain).  Compression-related. If a sensory nerve gets trapped or compressed for a long period of time, the blood supply to the nerve can be cut off.  Vascular. Many blood vessel diseases can cause neuropathic pain by decreasing blood supply and oxygen to nerves.  Autoimmune. This type of  pain results from diseases in which the body's defense system mistakenly attacks sensory nerves. Examples of autoimmune diseases that can cause neuropathic pain include lupus and multiple sclerosis.  Infectious. Many types of viral infections can damage sensory nerves and cause pain. Shingles infection is a common cause of this type of pain.  Inherited. Neuropathic pain can be a symptom of many diseases that are passed down through families (genetic). SIGNS AND SYMPTOMS  The main symptom is pain. Neuropathic pain is often described as:  Burning.  Shock-like.  Stinging.  Hot or cold.  Itching. DIAGNOSIS  No single test can diagnose neuropathic pain. Your health care provider will do a physical exam and ask you about your pain. You may use a pain scale to describe how bad your pain is. You may also have tests to see if you have a high sensitivity to pain and to help find the cause and location of any sensory nerve damage. These tests may include:  Imaging studies, such as:  X-rays.  CT scan.  MRI.  Nerve conduction studies to test how well nerve signals travel through your sensory nerves (electrodiagnostic testing).  Stimulating your sensory nerves through electrodes on your skin and measuring the response in your spinal cord and brain (somatosensory evoked potentials). TREATMENT  Treatment for neuropathic pain may change over time. You may need to try different treatment options or a combination of treatments. Some options include:  Over-the-counter pain relievers.  Prescription medicines. Some medicines used to treat other conditions may also help neuropathic pain. These  include medicines to:  Control seizures (anticonvulsants).  Relieve depression (antidepressants).  Prescription-strength pain relievers (narcotics). These are usually used when other pain relievers do not help.  Transcutaneous nerve stimulation (TENS). This uses electrical currents to block painful nerve  signals. The treatment is painless.  Topical and local anesthetics. These are medicines that numb the nerves. They can be injected as a nerve block or applied to the skin.  Alternative treatments, such as:  Acupuncture.  Meditation.  Massage.  Physical therapy.  Pain management programs.  Counseling. HOME CARE INSTRUCTIONS  Learn as much as you can about your condition.  Take medicines only as directed by your health care provider.  Work closely with all your health care providers to find what works best for you.  Have a good support system at home.  Consider joining a chronic pain support group. SEEK MEDICAL CARE IF:  Your pain treatments are not helping.  You are having side effects from your medicines.  You are struggling with fatigue, mood changes, depression, or anxiety.   This information is not intended to replace advice given to you by your health care provider. Make sure you discuss any questions you have with your health care provider.   Document Released: 04/07/2004 Document Revised: 08/01/2014 Document Reviewed: 12/19/2013 Elsevier Interactive Patient Education [employer].

## 2015-06-11 ENCOUNTER — Ambulatory Visit (HOSPITAL_BASED_OUTPATIENT_CLINIC_OR_DEPARTMENT_OTHER)
Admission: RE | Admit: 2015-06-11 | Discharge: 2015-06-11 | Disposition: A | Discharge status: Home / Self Care | Payer: Medicare HMO | Source: Ambulatory Visit | Attending: Family Medicine | Admitting: Family Medicine

## 2015-06-11 DIAGNOSIS — M79604 Pain in right leg: Secondary | ICD-10-CM | POA: Insufficient documentation

## 2015-06-11 DIAGNOSIS — M79661 Pain in right lower leg: Secondary | ICD-10-CM

## 2015-06-15 ENCOUNTER — Other Ambulatory Visit: Payer: Self-pay | Admitting: *Deleted

## 2015-06-15 ENCOUNTER — Other Ambulatory Visit: Payer: Self-pay

## 2015-06-15 DIAGNOSIS — M79661 Pain in right lower leg: Secondary | ICD-10-CM

## 2015-06-15 DIAGNOSIS — Z95828 Presence of other vascular implants and grafts: Secondary | ICD-10-CM

## 2015-06-15 MED ORDER — LIDOCAINE-PRILOCAINE 2.5-2.5 % EX CREA: 1.0000 "application " | TOPICAL_CREAM | CUTANEOUS | Status: DC | PRN

## 2015-06-17 ENCOUNTER — Other Ambulatory Visit: Payer: Self-pay

## 2015-06-17 DIAGNOSIS — E1142 Type 2 diabetes mellitus with diabetic polyneuropathy: Secondary | ICD-10-CM

## 2015-06-17 MED ORDER — FAMOTIDINE 20 MG PO TABS: 20.0000 mg | ORAL_TABLET | Freq: Two times a day (BID) | ORAL | Status: DC

## 2015-06-17 MED ORDER — HYDROCHLOROTHIAZIDE 25 MG PO TABS: ORAL_TABLET | ORAL | Status: DC

## 2015-06-17 MED ORDER — METFORMIN HCL 850 MG PO TABS: 850.0000 mg | ORAL_TABLET | Freq: Two times a day (BID) | ORAL | Status: DC

## 2015-06-22 ENCOUNTER — Ambulatory Visit: Payer: Self-pay | Admitting: Family Medicine

## 2015-06-29 ENCOUNTER — Ambulatory Visit: Payer: Medicare HMO | Admitting: Podiatry

## 2015-06-29 ENCOUNTER — Other Ambulatory Visit: Payer: Self-pay | Admitting: *Deleted

## 2015-06-29 DIAGNOSIS — Z95828 Presence of other vascular implants and grafts: Secondary | ICD-10-CM

## 2015-06-29 MED ORDER — LIDOCAINE-PRILOCAINE 2.5-2.5 % EX CREA: 1.0000 "application " | TOPICAL_CREAM | CUTANEOUS | Status: DC | PRN

## 2015-06-30 ENCOUNTER — Telehealth: Payer: Self-pay

## 2015-06-30 NOTE — Telephone Encounter (Signed)
Opened in error

## 2015-07-01 ENCOUNTER — Ambulatory Visit (INDEPENDENT_AMBULATORY_CARE_PROVIDER_SITE_OTHER): Payer: Medicare HMO | Admitting: Podiatry

## 2015-07-01 ENCOUNTER — Encounter: Payer: Self-pay | Admitting: Podiatry

## 2015-07-01 VITALS — BP 123/83 | HR 81

## 2015-07-01 DIAGNOSIS — B351 Tinea unguium: Secondary | ICD-10-CM | POA: Diagnosis not present

## 2015-07-01 DIAGNOSIS — M79606 Pain in leg, unspecified: Secondary | ICD-10-CM | POA: Diagnosis not present

## 2015-07-01 NOTE — Progress Notes (Signed)
Subjective: 64 year old female presents requesting her nails cut. Feet are numb and pain shooting through hands and feet. Been to pain clinic on Monday and was prescribed with Percocet.  Pain starts from toe, ball or top of foot started many years. Been diabetic 25 years. Blood sugar is under control. Taking 900 mg Gabapentin now that help some. Also take Vicodin if pain is excess.  S/P Chemo and radiation therapy for stomach cancer 4 years ago.   Objective: Dermatologic: Thick discolored and deformed nail 2nd left. Hypertrophic nails x 10. Vascular: All pedal pulses are palpable. No edema or erythema noted. Neurologic: Positive response to Monofilament sensory testing on both feet. Orthopedic: Severe contracted 2nd digit with plantar flexed distal end and deformed nail left.   Assessment: Onychomycosis x 10. Diabetic neuropathy. Painful feet. 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  Plan: Reviewed findings. All nails debrided.

## 2015-07-01 NOTE — Patient Instructions (Signed)
Seen for hypertrophic nails. All nails debrided. Return in 3 months or as needed.  

## 2015-07-06 ENCOUNTER — Other Ambulatory Visit: Payer: Self-pay | Admitting: Internal Medicine

## 2015-07-07 ENCOUNTER — Ambulatory Visit (HOSPITAL_BASED_OUTPATIENT_CLINIC_OR_DEPARTMENT_OTHER): Payer: Medicare HMO

## 2015-07-07 ENCOUNTER — Ambulatory Visit: Payer: Self-pay | Admitting: Family Medicine

## 2015-07-07 ENCOUNTER — Other Ambulatory Visit (HOSPITAL_BASED_OUTPATIENT_CLINIC_OR_DEPARTMENT_OTHER): Payer: Medicare HMO

## 2015-07-07 ENCOUNTER — Ambulatory Visit (INDEPENDENT_AMBULATORY_CARE_PROVIDER_SITE_OTHER): Payer: Medicare HMO | Admitting: Family Medicine

## 2015-07-07 ENCOUNTER — Encounter: Payer: Self-pay | Admitting: Family

## 2015-07-07 ENCOUNTER — Ambulatory Visit (HOSPITAL_BASED_OUTPATIENT_CLINIC_OR_DEPARTMENT_OTHER): Payer: Medicare HMO | Admitting: Family

## 2015-07-07 ENCOUNTER — Encounter: Payer: Self-pay | Admitting: Family Medicine

## 2015-07-07 VITALS — BP 135/76 | HR 88 | Temp 98.2°F | Resp 16 | Ht 67.0 in | Wt 258.0 lb

## 2015-07-07 VITALS — BP 135/76 | HR 88 | Temp 98.2°F | Ht 67.0 in | Wt 256.8 lb

## 2015-07-07 DIAGNOSIS — C169 Malignant neoplasm of stomach, unspecified: Secondary | ICD-10-CM

## 2015-07-07 DIAGNOSIS — E1143 Type 2 diabetes mellitus with diabetic autonomic (poly)neuropathy: Secondary | ICD-10-CM | POA: Diagnosis not present

## 2015-07-07 DIAGNOSIS — E119 Type 2 diabetes mellitus without complications: Secondary | ICD-10-CM

## 2015-07-07 DIAGNOSIS — E1142 Type 2 diabetes mellitus with diabetic polyneuropathy: Secondary | ICD-10-CM | POA: Diagnosis not present

## 2015-07-07 DIAGNOSIS — Z85028 Personal history of other malignant neoplasm of stomach: Secondary | ICD-10-CM

## 2015-07-07 DIAGNOSIS — Z23 Encounter for immunization: Secondary | ICD-10-CM

## 2015-07-07 DIAGNOSIS — E785 Hyperlipidemia, unspecified: Secondary | ICD-10-CM | POA: Diagnosis not present

## 2015-07-07 LAB — CBC WITH DIFFERENTIAL (CANCER CENTER ONLY)
BASO#: 0 10*3/uL (ref 0.0–0.2)
BASO%: 0.1 % (ref 0.0–2.0)
EOS%: 4 % (ref 0.0–7.0)
Eosinophils Absolute: 0.3 10*3/uL (ref 0.0–0.5)
HEMATOCRIT: 36.5 % (ref 34.8–46.6)
HEMOGLOBIN: 12.1 g/dL (ref 11.6–15.9)
LYMPH#: 2.4 10*3/uL (ref 0.9–3.3)
LYMPH%: 34.7 % (ref 14.0–48.0)
MCH: 29.1 pg (ref 26.0–34.0)
MCHC: 33.2 g/dL (ref 32.0–36.0)
MCV: 88 fL (ref 81–101)
MONO#: 0.6 10*3/uL (ref 0.1–0.9)
MONO%: 9.4 % (ref 0.0–13.0)
NEUT%: 51.8 % (ref 39.6–80.0)
NEUTROS ABS: 3.5 10*3/uL (ref 1.5–6.5)
Platelets: 353 10*3/uL (ref 145–400)
RBC: 4.16 10*6/uL (ref 3.70–5.32)
RDW: 13.6 % (ref 11.1–15.7)
WBC: 6.8 10*3/uL (ref 3.9–10.0)

## 2015-07-07 MED ORDER — SODIUM CHLORIDE 0.9 % IJ SOLN
10.0000 mL | INTRAMUSCULAR | Status: DC | PRN
  Administered 2015-07-07: 10 mL via INTRAVENOUS
  Filled 2015-07-07: qty 10

## 2015-07-07 MED ORDER — HEPARIN SOD (PORK) LOCK FLUSH 100 UNIT/ML IV SOLN
500.0000 [IU] | Freq: Once | INTRAVENOUS | Status: AC | PRN
  Administered 2015-07-07: 500 [IU] via INTRAVENOUS
  Filled 2015-07-07: qty 5

## 2015-07-07 MED ORDER — METFORMIN HCL 850 MG PO TABS
850.0000 mg | ORAL_TABLET | Freq: Two times a day (BID) | ORAL | Status: DC
Start: 2015-07-07 — End: 2015-12-30

## 2015-07-07 MED ORDER — GLIMEPIRIDE 4 MG PO TABS: 4.0000 mg | ORAL_TABLET | Freq: Two times a day (BID) | ORAL | Status: DC

## 2015-07-07 NOTE — Progress Notes (Signed)
[name] presented for Portacath access and flush. Proper placement of portacath confirmed by CXR. Portacath located in the right chest wall accessed with  H 20 needle. Clean, Dry and Intact Good blood return present. Portacath flushed with 75m NS and 500U/553mHeparin per protocol and needle removed intact. Procedure without incident. Patient tolerated procedure well.

## 2015-07-07 NOTE — Assessment & Plan Note (Signed)
Check labs  Con' t amaryl and metformin

## 2015-07-07 NOTE — Progress Notes (Signed)
Patient ID: [name], female    DOB: 1951-06-02  Age: 65 y.o. MRN: [id number]    Subjective:  Subjective HPI [name] presents for f/u dm and htn.  Labs done upstairs earlier today.  bs running 95-135.    Review of Systems  Constitutional: Negative for diaphoresis, appetite change, fatigue and unexpected weight change.  Eyes: Negative for pain, redness and visual disturbance.  Respiratory: Negative for cough, chest tightness, shortness of breath and wheezing.   Cardiovascular: Negative for chest pain, palpitations and leg swelling.  Endocrine: Negative for cold intolerance, heat intolerance, polydipsia, polyphagia and polyuria.  Genitourinary: Negative for dysuria, frequency and difficulty urinating.  Neurological: Negative for dizziness, light-headedness, numbness and headaches.    History Past Medical History  Diagnosis Date  . stomach ca dx'd 2010    chemo/xrt comp 05/2009  . Hypertension   . Diabetes mellitus   . Asthma   . Sleep apnea   . Degenerative joint disease   . Hyperlipidemia   . GERD (gastroesophageal reflux disease)   . Neuropathy ([place])     bilateral hands and feet    She has past surgical history that includes Total knee arthroplasty; Total hip arthroplasty; Shoulder surgery; Appendectomy; Cesarean section; Hernia repair; Colon surgery; and Esophagogastroduodenoscopy (N/A, 10/15/2012).   Her family history includes Alzheimer's disease in her father; Cancer in her maternal grandfather and mother; Diabetes in her brother and maternal grandmother. There is no history of Suicidality, Depression, Dementia, or Anxiety disorder.She reports that she quit smoking about 26 years ago. Her smoking use included Cigarettes. She started smoking about 41 years ago. She has a 15 pack-year smoking history. She has never used smokeless tobacco. She reports that she does not drink alcohol or use illicit drugs.  Current Outpatient Prescriptions on File Prior to Visit    Medication Sig Dispense Refill  . aspirin 81 MG tablet Take 81 mg by mouth at bedtime.    Marland Kitchen azelastine (ASTELIN) 0.1 % nasal spray Place 1 spray into both nostrils 2 (two) times daily. Use in each nostril as directed 30 mL 12  . DULoxetine (CYMBALTA) 60 MG capsule Take 1 capsule (60 mg total) by mouth 2 (two) times daily. 180 capsule 3  . erythromycin base (E-MYCIN) 500 MG tablet     . famotidine (PEPCID) 20 MG tablet Take 1 tablet (20 mg total) by mouth 2 (two) times daily. 180 tablet 1  . gabapentin (NEURONTIN) 300 MG capsule Take 3 capsules (900 mg total) by mouth 3 (three) times daily. 270 capsule 5  . hydrochlorothiazide (HYDRODIURIL) 25 MG tablet TAKE 1 TABLET (25 MG TOTAL) BY MOUTH DAILY. 90 tablet 1  . lidocaine-prilocaine (EMLA) cream Apply 1 application topically as needed. 30 g 5  . metaxalone (SKELAXIN) 800 MG tablet Take 1 tablet (800 mg total) by mouth 4 (four) times daily. 30 tablet 0  . montelukast (SINGULAIR) 10 MG tablet Take 1 tablet (10 mg total) by mouth at bedtime. 90 tablet 3  . Multiple Vitamin (MULTIVITAMIN WITH MINERALS) TABS tablet Take 1 tablet by mouth daily.     No current facility-administered medications on file prior to visit.     Objective:  Objective Physical Exam  Constitutional: She is oriented to person, place, and time. She appears well-developed and well-nourished.  HENT:  Head: Normocephalic and atraumatic.  Eyes: Conjunctivae and EOM are normal.  Neck: Normal range of motion. Neck supple. No JVD present. Carotid bruit is not present. No thyromegaly present.  Cardiovascular:  Normal rate, regular rhythm and normal heart sounds.   No murmur heard. Pulmonary/Chest: Effort normal and breath sounds normal. No respiratory distress. She has no wheezes. She has no rales. She exhibits no tenderness.  Musculoskeletal: She exhibits no edema.  Neurological: She is alert and oriented to person, place, and time.  Numbness and pain in hands and feet    Psychiatric: She has a normal mood and affect. Her behavior is normal.  Nursing note and vitals reviewed.  BP 135/76 mmHg  Pulse 88  Temp(Src) 98.2 F (36.8 C) (Oral)  Ht 5' 7"  (1.702 m)  Wt 256 lb 12.8 oz (116.484 kg)  BMI 40.21 kg/m2 Wt Readings from Last 3 Encounters:  07/07/15 256 lb 12.8 oz (116.484 kg)  07/07/15 258 lb (117.028 kg)  06/09/15 262 lb 12.8 oz (119.205 kg)     Lab Results  Component Value Date   WBC 6.8 07/07/2015   HGB 12.1 07/07/2015   HCT 36.5 07/07/2015   PLT 353 07/07/2015   GLUCOSE 217* 05/16/2015   CHOL 194 04/06/2015   TRIG 170* 04/06/2015   HDL 48 04/06/2015   LDLCALC 112 04/06/2015   ALT 14 05/16/2015   AST 20 05/16/2015   NA 138 05/16/2015   K 4.6 05/16/2015   CL 104 05/16/2015   CREATININE 1.03* 05/16/2015   BUN 18 05/16/2015   CO2 29 05/16/2015   TSH 2.435 07/31/2014   INR 0.98 11/05/2010   HGBA1C 9.3* 04/06/2015   MICROALBUR 0.3 04/06/2015    US Venous Img Lower Unilateral Right  06/11/2015  CLINICAL DATA:  Right popliteal fossa pain for 1 month. EXAM: RIGHT LOWER EXTREMITY VENOUS DOPPLER ULTRASOUND TECHNIQUE: Gray-scale sonography with graded compression, as well as color Doppler and duplex ultrasound were performed to evaluate the lower extremity deep venous systems from the level of the common femoral vein and including the common femoral, femoral, profunda femoral, popliteal and calf veins including the posterior tibial, peroneal and gastrocnemius veins when visible. The superficial great saphenous vein was also interrogated. Spectral Doppler was utilized to evaluate flow at rest and with distal augmentation maneuvers in the common femoral, femoral and popliteal veins. COMPARISON:  None. FINDINGS: Contralateral Common Femoral Vein: Respiratory phasicity is normal and symmetric with the symptomatic side. No evidence of thrombus. Normal compressibility. Common Femoral Vein: No evidence of thrombus. Normal compressibility, respiratory  phasicity and response to augmentation. Saphenofemoral Junction: No evidence of thrombus. Normal compressibility and flow on color Doppler imaging. Profunda Femoral Vein: No evidence of thrombus. Normal compressibility and flow on color Doppler imaging. Femoral Vein: No evidence of thrombus. Normal compressibility, respiratory phasicity and response to augmentation. Popliteal Vein: No evidence of thrombus. Normal compressibility, respiratory phasicity and response to augmentation. Calf Veins: No evidence of thrombus. Normal compressibility and flow on color Doppler imaging. Superficial Great Saphenous Vein: No evidence of thrombus. Normal compressibility and flow on color Doppler imaging. Venous Reflux:  None. Other Findings:  Negative for a Baker's cyst. IMPRESSION: No evidence of deep venous thrombosis. Electronically Signed   By: Jerilynn Mages.  Shick M.D.   On: 06/11/2015 17:01     Assessment & Plan:  Plan I have discontinued Ms. Aki's Hydrocodone-Acetaminophen. I am also having her maintain her multivitamin with minerals, azelastine, aspirin, erythromycin base, metaxalone, montelukast, gabapentin, DULoxetine, famotidine, hydrochlorothiazide, lidocaine-prilocaine, oxyCODONE-acetaminophen, glimepiride, and metFORMIN.  Meds ordered this encounter  Medications  . oxyCODONE-acetaminophen (PERCOCET) 10-325 MG tablet    Sig: Take 1 tablet by mouth 3 (three) times daily.   Marland Kitchen  glimepiride (AMARYL) 4 MG tablet    Sig: Take 1 tablet (4 mg total) by mouth 2 (two) times daily. **LAST REFILL**PT NEEDS APPT WITH DR Cruzita Lederer**    Dispense:  90 tablet    Refill:  0  . metFORMIN (GLUCOPHAGE) 850 MG tablet    Sig: Take 1 tablet (850 mg total) by mouth 2 (two) times daily. Repeat labs are due now    Dispense:  180 tablet    Refill:  0    Problem List Items Addressed This Visit    Hyperlipidemia    Check labs  On no meds Lab Results  Component Value Date   CHOL 194 04/06/2015   HDL 48 04/06/2015   LDLCALC 112  04/06/2015   TRIG 170* 04/06/2015   CHOLHDL 4.0 04/06/2015         Diabetic polyneuropathy associated with type 2 diabetes mellitus ([facility])    Check labs  Con' t amaryl and metformin      Relevant Medications   glimepiride (AMARYL) 4 MG tablet   metFORMIN (GLUCOPHAGE) 850 MG tablet    Other Visit Diagnoses    Diabetic autonomic neuropathy associated with type 2 diabetes mellitus ([place])    -  Primary    Relevant Medications    glimepiride (AMARYL) 4 MG tablet    metFORMIN (GLUCOPHAGE) 850 MG tablet    Need for 23-polyvalent pneumococcal polysaccharide vaccine        Relevant Orders    Pneumococcal polysaccharide vaccine 23-valent greater than or equal to 2yo subcutaneous/IM (Completed)       Follow-up: Return in about 6 months (around 01/05/2016), or if symptoms worsen or fail to improve, for hypertension, diabetes II.  Garnet Koyanagi, DO

## 2015-07-07 NOTE — Progress Notes (Signed)
Hematology and Oncology Follow Up Visit  [name] [id number] August 04, 1950 64 y.o. 07/07/2015   Principle Diagnosis:  Stage IB (T1, N1, M0) adenocarcinoma of the stomach  Current Therapy:   Observation    Interim History:  [name] is here today for a follow-up. She recently had an appointment at the pain clinic and she was switched to Percocet from Vicodin for neuropathy in her hands and feet. She is still on Neurontin as well.  She has occasional bloating and abdominal discomfort that comes and goes. Her blood sugars are fairly well controlled. She is on glimepiride and metformin.  She is staying well hydrated. She states that her appetite is down and she usually eats one meal a day. Her weight is stable.  No fever, chills, n/v, cough, rash, dizziness, headache, vision changes, SOB, chest pain or changes in bowel or bladder habits. She has had no swelling or tenderness in her extremities. No c/o joint or "boney" pain.   Medications:    Medication List       This list is accurate as of: 07/07/15  2:17 PM.  Always use your most recent med list.               aspirin 81 MG tablet  Take 81 mg by mouth at bedtime.     azelastine 0.1 % nasal spray  Commonly known as:  ASTELIN  Place 1 spray into both nostrils 2 (two) times daily. Use in each nostril as directed     DULoxetine 60 MG capsule  Commonly known as:  CYMBALTA  Take 1 capsule (60 mg total) by mouth 2 (two) times daily.     erythromycin base 500 MG tablet  Commonly known as:  E-MYCIN     famotidine 20 MG tablet  Commonly known as:  PEPCID  Take 1 tablet (20 mg total) by mouth 2 (two) times daily.     gabapentin 300 MG capsule  Commonly known as:  NEURONTIN  Take 3 capsules (900 mg total) by mouth 3 (three) times daily.     glimepiride 4 MG tablet  Commonly known as:  AMARYL  Take 1 tablet (4 mg total) by mouth 2 (two) times daily. **LAST REFILL**PT NEEDS APPT WITH DR Cruzita Lederer**     hydrochlorothiazide 25  MG tablet  Commonly known as:  HYDRODIURIL  TAKE 1 TABLET (25 MG TOTAL) BY MOUTH DAILY.     Hydrocodone-Acetaminophen 7.5-300 MG Tabs  1 po q6h prn pain     lidocaine-prilocaine cream  Commonly known as:  EMLA  Apply 1 application topically as needed.     metaxalone 800 MG tablet  Commonly known as:  SKELAXIN  Take 1 tablet (800 mg total) by mouth 4 (four) times daily.     metFORMIN 850 MG tablet  Commonly known as:  GLUCOPHAGE  Take 1 tablet (850 mg total) by mouth 2 (two) times daily. Repeat labs are due now     montelukast 10 MG tablet  Commonly known as:  SINGULAIR  Take 1 tablet (10 mg total) by mouth at bedtime.     multivitamin with minerals Tabs tablet  Take 1 tablet by mouth daily.        Allergies:  Allergies  Allergen Reactions  . Adhesive [Tape] Other (See Comments)    Burn Skin  . Codeine Other (See Comments)    paranoid  . Zocor [Simvastatin - High Dose] Other (See Comments)    Muscle spasms  . Penicillins Rash  Past Medical History, Surgical history, Social history, and Family History were reviewed and updated.  Review of Systems: All other 10 point review of systems is negative.   Physical Exam:  height is 5' 7"  (1.702 m) and weight is 258 lb (117.028 kg). Her oral temperature is 98.2 F (36.8 C). Her blood pressure is 135/76 and her pulse is 88. Her respiration is 16.   Wt Readings from Last 3 Encounters:  07/07/15 258 lb (117.028 kg)  06/09/15 262 lb 12.8 oz (119.205 kg)  05/16/15 250 lb (113.399 kg)    Ocular: Sclerae unicteric, pupils equal, round and reactive to light Ear-nose-throat: Oropharynx clear, dentition fair Lymphatic: No cervical or supraclavicular adenopathy Lungs no rales or rhonchi, good excursion bilaterally Heart regular rate and rhythm, no murmur appreciated Abd soft, nontender, positive bowel sounds MSK no focal spinal tenderness, no joint edema Neuro: non-focal, well-oriented, appropriate affect Breasts:  Deferred  Lab Results  Component Value Date   WBC 7.9 05/16/2015   HGB 11.6* 05/16/2015   HCT 35.2* 05/16/2015   MCV 88.9 05/16/2015   PLT 370 05/16/2015   Lab Results  Component Value Date   FERRITIN 306* 09/25/2014   IRON 62 09/25/2014   TIBC 249 09/25/2014   UIBC 187 09/25/2014   IRONPCTSAT 25 09/25/2014   Lab Results  Component Value Date   RETICCTPCT 1.5 04/28/2014   RBC 3.96 05/16/2015   RETICCTABS 59.9 04/28/2014   No results found for: KPAFRELGTCHN, LAMBDASER, KAPLAMBRATIO No results found for: IGGSERUM, IGA, IGMSERUM No results found for: Odetta Pink, SPEI   Chemistry      Component Value Date/Time   NA 138 05/16/2015 2104   NA 141 01/13/2015 1030   NA 146* 09/25/2014 0933   K 4.6 05/16/2015 2104   K 4.9 01/13/2015 1030   K 5.1* 09/25/2014 0933   CL 104 05/16/2015 2104   CL 102 09/25/2014 0933   CO2 29 05/16/2015 2104   CO2 28 01/13/2015 1030   CO2 30 09/25/2014 0933   BUN 18 05/16/2015 2104   BUN 18.7 01/13/2015 1030   BUN 15 09/25/2014 0933   CREATININE 1.03* 05/16/2015 2104   CREATININE 1.2* 01/13/2015 1030   CREATININE 1.10 10/22/2014 1117      Component Value Date/Time   CALCIUM 8.9 05/16/2015 2104   CALCIUM 9.7 01/13/2015 1030   CALCIUM 9.5 09/25/2014 0933   ALKPHOS 54 05/16/2015 2104   ALKPHOS 70 01/13/2015 1030   ALKPHOS 82 09/25/2014 0933   AST 20 05/16/2015 2104   AST 16 01/13/2015 1030   AST 20 09/25/2014 0933   ALT 14 05/16/2015 2104   ALT 17 01/13/2015 1030   ALT 16 09/25/2014 0933   BILITOT 0.4 05/16/2015 2104   BILITOT 0.35 01/13/2015 1030   BILITOT 0.50 09/25/2014 0933     Impression and Plan: [name] is a 64 year old African [place] female with stage IB stomach cancer. She underwent resection and completed adjuvant radiation and chemotherapy over 7 years ago. So far she has done well and there has been no evidence of recurrence.  Her CBC today is normal. We added a Hgb  A1c to her labs per her request.  She had her port flushed today and will continue to have it flushed ever 8 weeks.  We will plan to see her back in 3 months for labs and follow-up.  She will contact us with any questions or concerns. We can certainly see her sooner if need  be.   Eliezer Bottom, NP 12/13/20162:17 PM

## 2015-07-07 NOTE — Progress Notes (Signed)
Pre visit review using our clinic review tool, if applicable. No additional management support is needed unless otherwise documented below in the visit note. 

## 2015-07-07 NOTE — Patient Instructions (Signed)

## 2015-07-07 NOTE — Patient Instructions (Signed)

## 2015-07-07 NOTE — Assessment & Plan Note (Signed)
Check labs  On no meds Lab Results  Component Value Date   CHOL 194 04/06/2015   HDL 48 04/06/2015   LDLCALC 112 04/06/2015   TRIG 170* 04/06/2015   CHOLHDL 4.0 04/06/2015

## 2015-07-08 ENCOUNTER — Other Ambulatory Visit: Payer: Self-pay

## 2015-07-08 ENCOUNTER — Telehealth: Payer: Self-pay

## 2015-07-08 LAB — LIPID PANEL
CHOL/HDL RATIO: 3.2 ratio (ref <=5.0)
Cholesterol: 185 mg/dL (ref 125–200)
HDL: 58 mg/dL (ref >=46)
LDL CALC: 98 mg/dL (ref <130)
TRIGLYCERIDES: 144 mg/dL (ref <150)
VLDL: 29 mg/dL (ref <30)

## 2015-07-08 LAB — COMPREHENSIVE METABOLIC PANEL (CC13)
ALK PHOS: 62 U/L (ref 33–130)
ALT: 15 U/L (ref 6–29)
AST: 17 U/L (ref 10–35)
Albumin: 3.8 g/dL (ref 3.6–5.1)
BILIRUBIN TOTAL: 0.3 mg/dL (ref 0.2–1.2)
BUN: 21 mg/dL (ref 7–25)
CALCIUM: 9.4 mg/dL (ref 8.6–10.4)
CO2: 26 mmol/L (ref 20–31)
CREATININE: 1.28 mg/dL — AB (ref 0.50–0.99)
Chloride: 100 mmol/L (ref 98–110)
GLUCOSE: 203 mg/dL — AB (ref 65–99)
Potassium: 4.6 mmol/L (ref 3.5–5.3)
SODIUM: 137 mmol/L (ref 135–146)
Total Protein: 7.1 g/dL (ref 6.1–8.1)

## 2015-07-08 LAB — IRON AND TIBC
%SAT: 19 % — ABNORMAL LOW (ref 21–57)
Iron: 51 ug/dL (ref 41–142)
TIBC: 265 ug/dL (ref 236–444)
UIBC: 215 ug/dL (ref 120–384)

## 2015-07-08 LAB — HEMOGLOBIN A1C
HEMOGLOBIN A1C: 10.9 % — AB (ref <5.7)
Mean Plasma Glucose: 266 mg/dL — ABNORMAL HIGH (ref <117)

## 2015-07-08 LAB — FERRITIN: Ferritin: 197 ng/ml (ref 9–269)

## 2015-07-08 NOTE — Telephone Encounter (Signed)
Spoke with Shanon Brow in the lab and he will call and have it added.     KP

## 2015-07-08 NOTE — Telephone Encounter (Signed)
-----   Message from Rosalita Chessman, DO sent at 07/07/2015  3:10 PM EST ----- Can lipid be added to labs drawn upstairs?

## 2015-07-09 ENCOUNTER — Other Ambulatory Visit: Payer: Self-pay | Admitting: *Deleted

## 2015-07-09 ENCOUNTER — Telehealth: Payer: Self-pay | Admitting: *Deleted

## 2015-07-09 DIAGNOSIS — D509 Iron deficiency anemia, unspecified: Secondary | ICD-10-CM

## 2015-07-09 NOTE — Telephone Encounter (Addendum)
Patient aware of results. Appointment made  ----- Message from Eliezer Bottom, NP sent at 07/08/2015  2:35 PM EST ----- Regarding: iron Iron saturation 19% will need 1 dose of Feraheme this week. Thank you!!!   ----- Message -----    From: [facility] Interface    Sent: 07/07/2015   2:50 PM      To: Eliezer Bottom, NP

## 2015-07-15 ENCOUNTER — Ambulatory Visit (HOSPITAL_BASED_OUTPATIENT_CLINIC_OR_DEPARTMENT_OTHER): Payer: Medicare HMO

## 2015-07-15 VITALS — BP 104/66 | HR 79 | Temp 98.0°F | Resp 16

## 2015-07-15 DIAGNOSIS — C169 Malignant neoplasm of stomach, unspecified: Secondary | ICD-10-CM

## 2015-07-15 DIAGNOSIS — D509 Iron deficiency anemia, unspecified: Secondary | ICD-10-CM | POA: Diagnosis not present

## 2015-07-15 MED ORDER — HEPARIN SOD (PORK) LOCK FLUSH 100 UNIT/ML IV SOLN
500.0000 [IU] | Freq: Once | INTRAVENOUS | Status: AC | PRN
  Administered 2015-07-15: 500 [IU] via INTRAVENOUS
  Filled 2015-07-15: qty 5

## 2015-07-15 MED ORDER — SODIUM CHLORIDE 0.9 % IV SOLN
510.0000 mg | Freq: Once | INTRAVENOUS | Status: AC
  Administered 2015-07-15: 510 mg via INTRAVENOUS
  Filled 2015-07-15: qty 17

## 2015-07-15 MED ORDER — SODIUM CHLORIDE 0.9 % IV SOLN
INTRAVENOUS | Status: DC
  Administered 2015-07-15: 09:00:00 via INTRAVENOUS

## 2015-07-15 MED ORDER — SODIUM CHLORIDE 0.9 % IJ SOLN
10.0000 mL | INTRAMUSCULAR | Status: DC | PRN
  Administered 2015-07-15: 10 mL via INTRAVENOUS
  Filled 2015-07-15: qty 10

## 2015-07-15 NOTE — Patient Instructions (Signed)

## 2015-07-26 DIAGNOSIS — G459 Transient cerebral ischemic attack, unspecified: Secondary | ICD-10-CM

## 2015-07-26 HISTORY — DX: Transient cerebral ischemic attack, unspecified: G45.9

## 2015-08-14 IMAGING — MG MM DIGITAL SCREENING BILATERAL
4 series · 4 of 4 positions shown · non-contrast
Comparison: Previous exam(s).

CLINICAL DATA: Screening.

EXAM:
DIGITAL SCREENING BILATERAL MAMMOGRAM WITH CAD

[R CC]
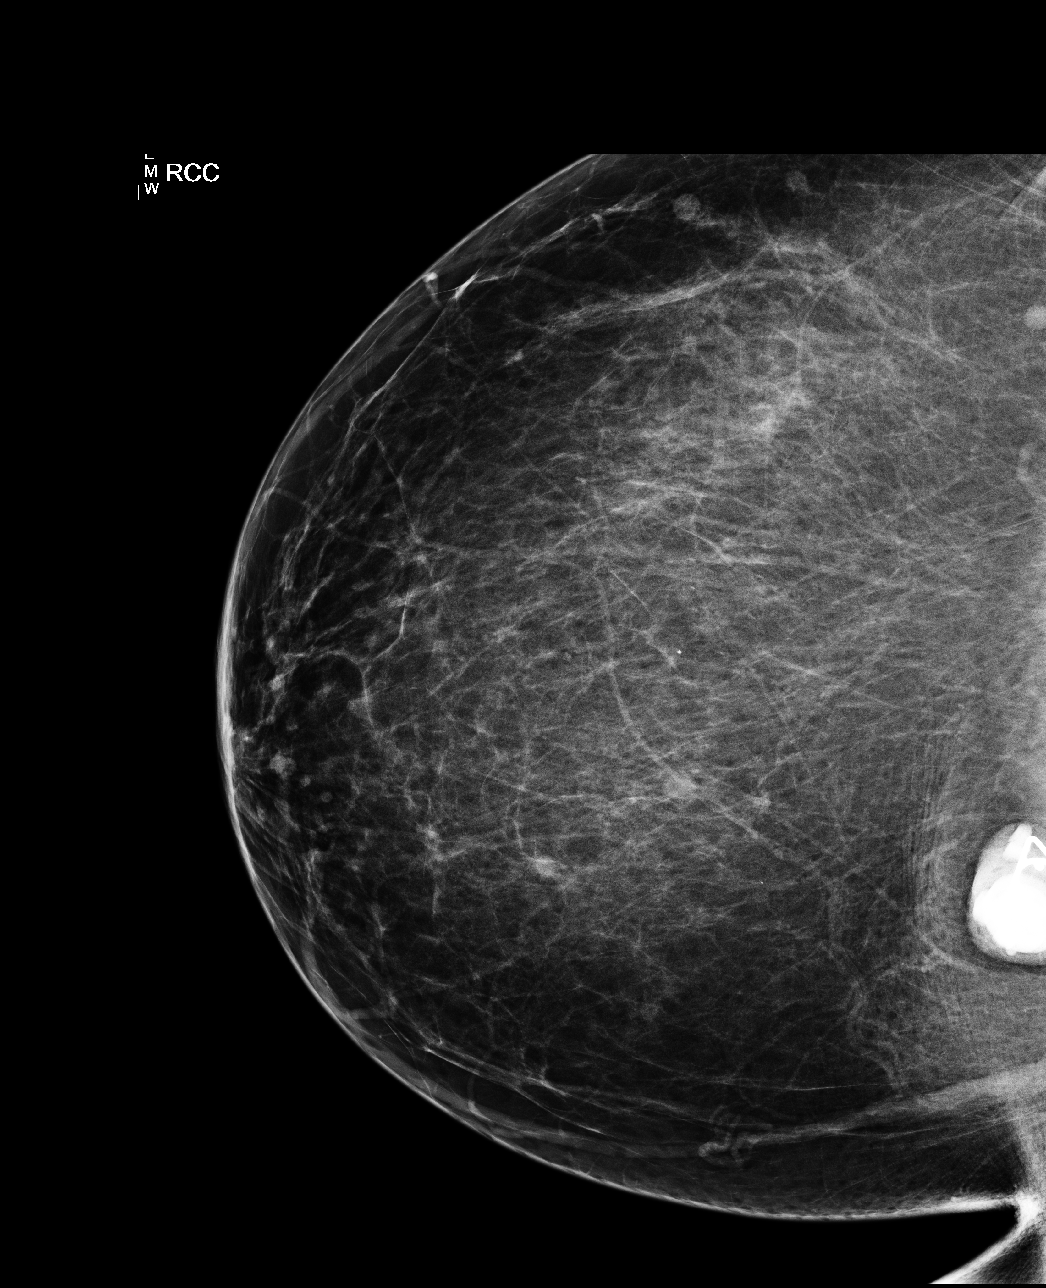

[L CC]
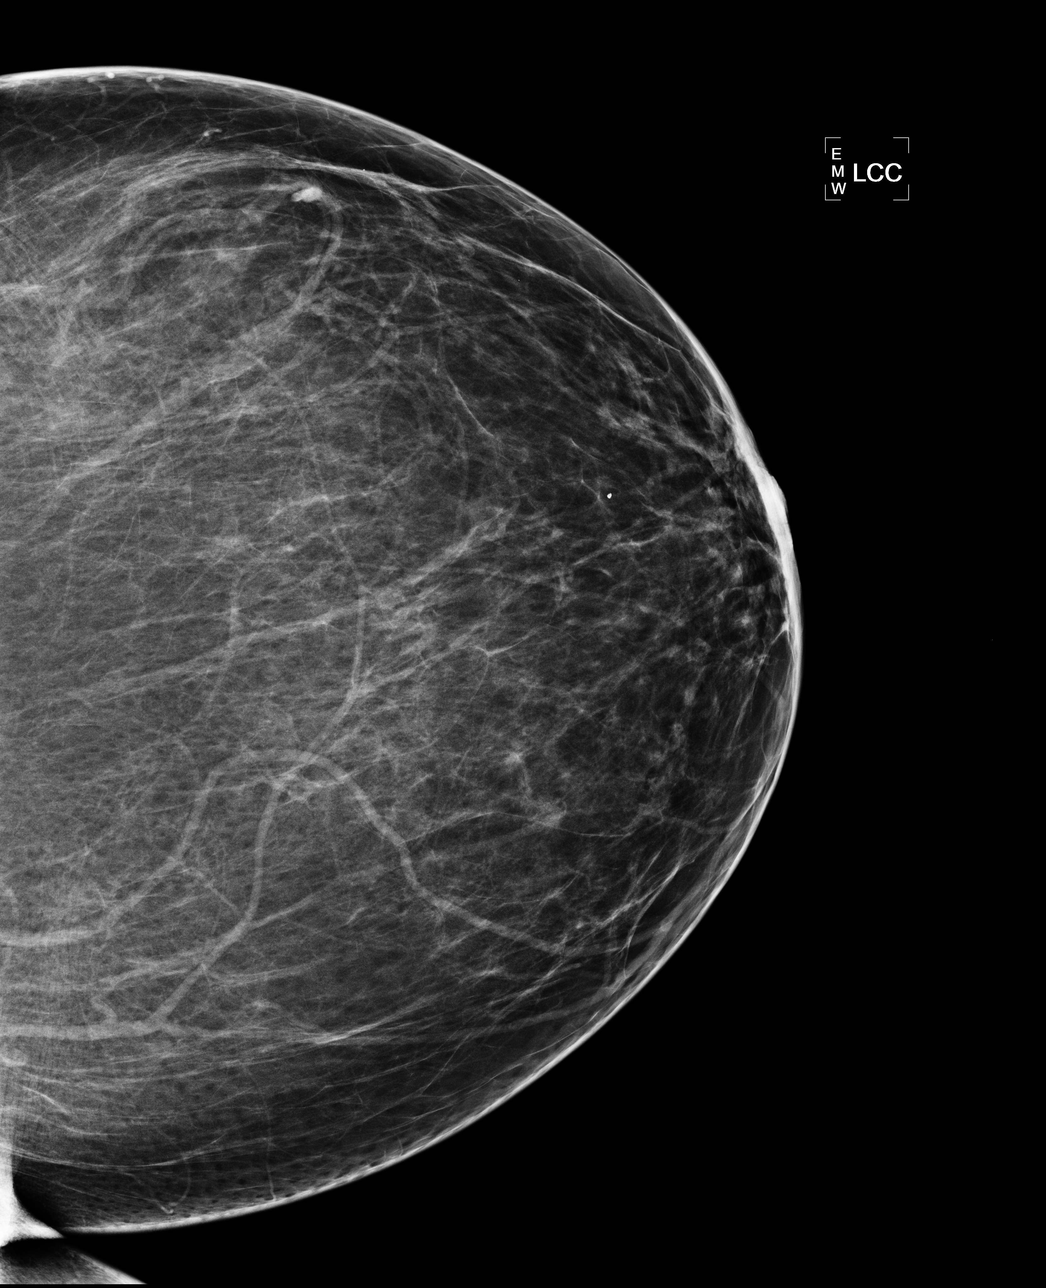

[L MLO]
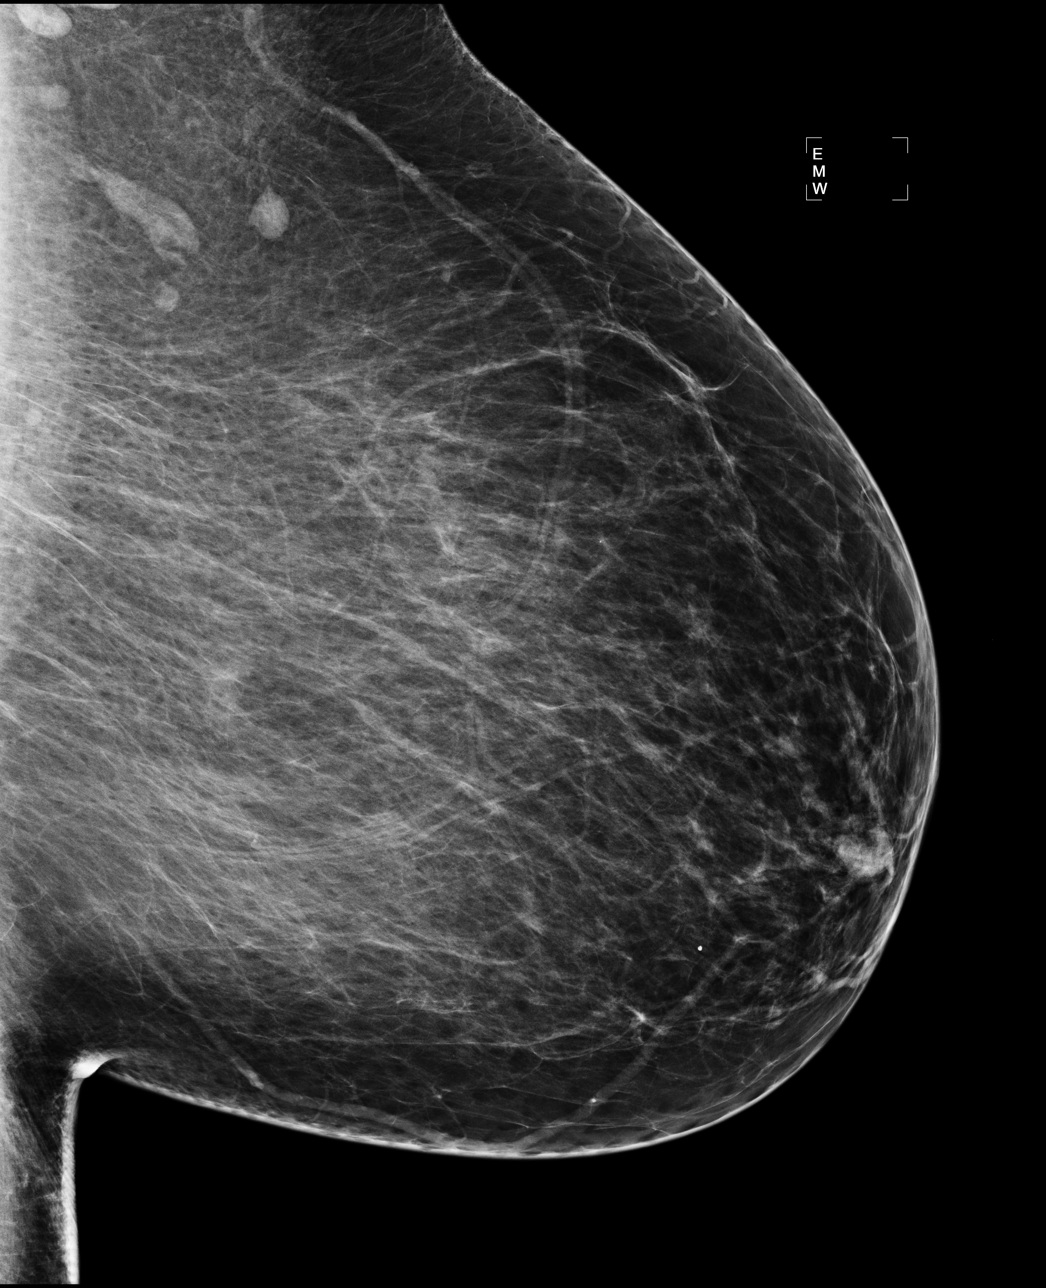

[R MLO]
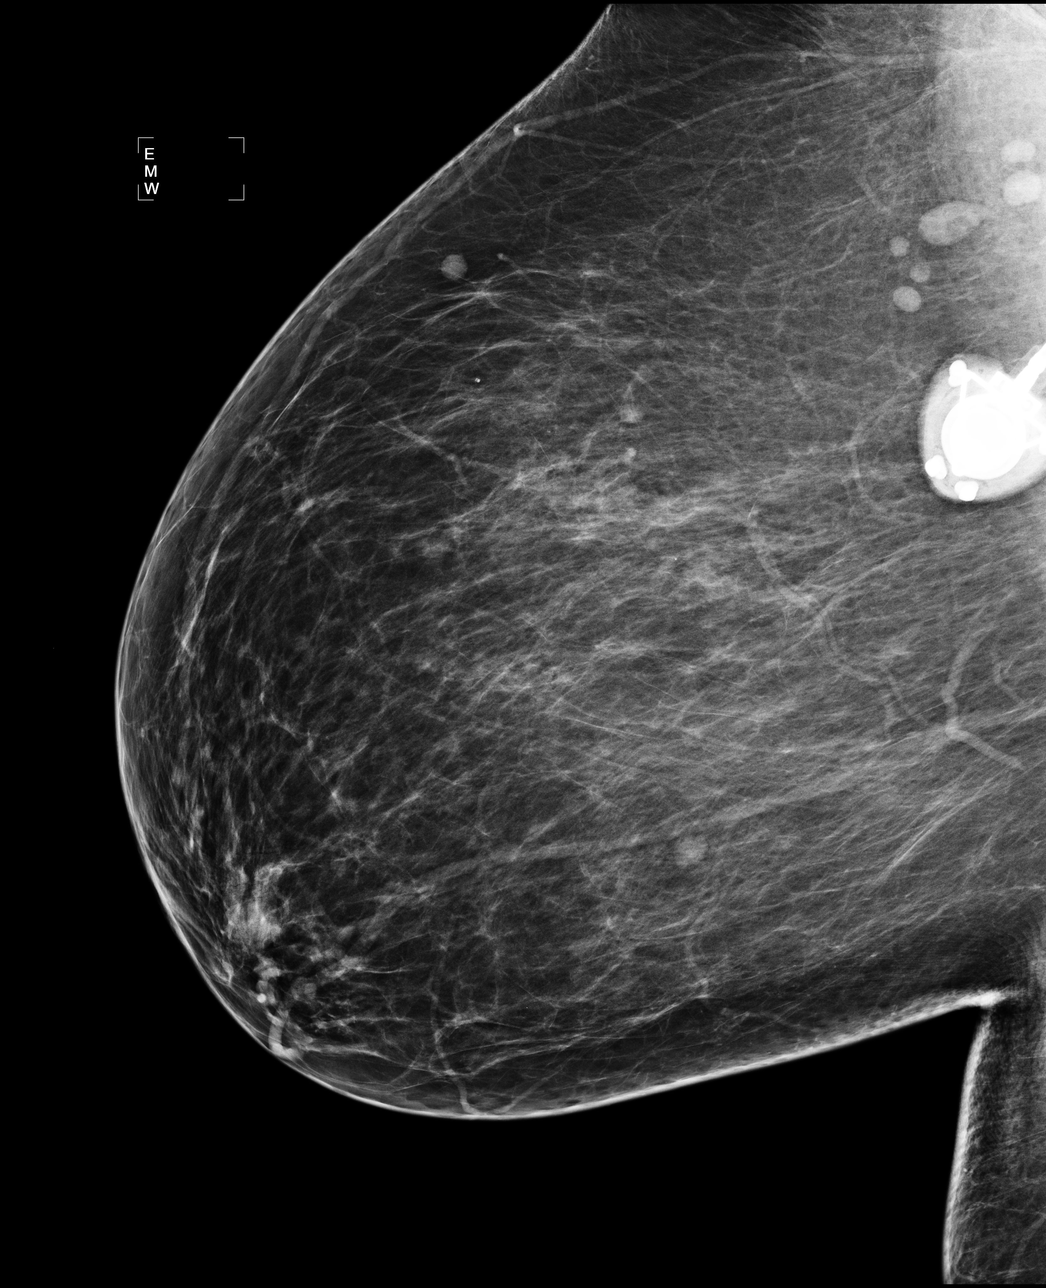

[4 of 4 positions shown; findings below may reference images not displayed]

ACR Breast Density Category b: There are scattered areas of
fibroglandular density.
FINDINGS: There are no findings suspicious for malignancy. Images were
processed with CAD.
IMPRESSION: No mammographic evidence of malignancy. A result letter of this
screening mammogram will be mailed directly to the patient.

RECOMMENDATION:
Screening mammogram in one year. (Code:AS-G-LCT)

BI-RADS CATEGORY  1: Negative.

## 2015-08-17 ENCOUNTER — Ambulatory Visit: Payer: Self-pay | Admitting: Neurology

## 2015-08-20 ENCOUNTER — Telehealth: Payer: Self-pay | Admitting: Family Medicine

## 2015-08-20 NOTE — Telephone Encounter (Signed)
Patient calling back checking on the status of below message

## 2015-08-20 NOTE — Telephone Encounter (Signed)
Caller name: Self  Can be reached: [phone]   Reason for call: Patient request a call back to discuss a rx for sleeping medicine. States she can not sleep and wants to explain the reason to [name]. Offered an appt to come in but patient declined.

## 2015-08-20 NOTE — Telephone Encounter (Signed)
Ativan 0.5 mg #30  1 po qhs prn

## 2015-08-20 NOTE — Telephone Encounter (Signed)
Spoke with the patient and she said her friend who bought her to the office at her last visit passed away and it has her feeling bad and she is having trouble sleeping. She has never taken in the past to her sleep, she said when she gets she gets nauseated, she said she does not feel depressed, she just had a new grandchild 3 days ago but she just can not sleep.     Please advise    KP

## 2015-08-21 MED ORDER — LORAZEPAM 0.5 MG PO TABS: 0.5000 mg | ORAL_TABLET | Freq: Every evening | ORAL | Status: DC | PRN

## 2015-08-21 NOTE — Telephone Encounter (Signed)
Patient has been made aware that the medication is being faxed.    KP

## 2015-09-01 ENCOUNTER — Encounter: Payer: Self-pay | Admitting: Family

## 2015-09-01 ENCOUNTER — Ambulatory Visit (HOSPITAL_BASED_OUTPATIENT_CLINIC_OR_DEPARTMENT_OTHER)
Admission: RE | Admit: 2015-09-01 | Discharge: 2015-09-01 | Disposition: A | Discharge status: Home / Self Care | Payer: Medicare HMO | Source: Ambulatory Visit | Attending: Family | Admitting: Family

## 2015-09-01 ENCOUNTER — Ambulatory Visit (INDEPENDENT_AMBULATORY_CARE_PROVIDER_SITE_OTHER): Payer: Medicare HMO | Admitting: Family

## 2015-09-01 VITALS — BP 110/71 | HR 89 | Temp 99.4°F | Resp 16 | Ht 67.0 in | Wt 257.8 lb

## 2015-09-01 DIAGNOSIS — B349 Viral infection, unspecified: Secondary | ICD-10-CM | POA: Diagnosis not present

## 2015-09-01 DIAGNOSIS — R05 Cough: Secondary | ICD-10-CM

## 2015-09-01 DIAGNOSIS — R0602 Shortness of breath: Secondary | ICD-10-CM | POA: Diagnosis not present

## 2015-09-01 DIAGNOSIS — R059 Cough, unspecified: Secondary | ICD-10-CM

## 2015-09-01 DIAGNOSIS — R509 Fever, unspecified: Secondary | ICD-10-CM | POA: Insufficient documentation

## 2015-09-01 DIAGNOSIS — J988 Other specified respiratory disorders: Secondary | ICD-10-CM

## 2015-09-01 DIAGNOSIS — R062 Wheezing: Secondary | ICD-10-CM | POA: Insufficient documentation

## 2015-09-01 DIAGNOSIS — B9789 Other viral agents as the cause of diseases classified elsewhere: Secondary | ICD-10-CM

## 2015-09-01 LAB — POCT INFLUENZA A/B
INFLUENZA A, POC: NEGATIVE
INFLUENZA B, POC: NEGATIVE

## 2015-09-01 NOTE — Patient Instructions (Signed)
Please complete chest x ray on the first floor. Call if you develop fever >101, if symptoms worsen, or if you are not improved in 3 days.

## 2015-09-01 NOTE — Progress Notes (Signed)
Pre visit review using our clinic review tool, if applicable. No additional management support is needed unless otherwise documented below in the visit note. 

## 2015-09-01 NOTE — Progress Notes (Signed)
Subjective:    Patient ID: [name], female    DOB: May 29, 1951, 65 y.o.   MRN: [id number]  HPI   [name] is a 65 yr old female who presents today with report of productive cough. Reports associated weakness/sluggishness. Also feels short of breath. Cough began at 3AM last night. Sputum is light green in color.  Had HA and nausea last night. Cough better this AM.  Worse with laying down.  Reports some gerd symptoms as well.  Reports some aching, denies chills.  + anorexia.  Denies nasal drainage, congestion, or otalgia. Notes mild sob with exertion.    Denies current HA.  Denies sick contacts. She reports pain behind her right knee for months. Had a negative doppler to evaluate this complaint back in November.    Review of Systems See HPI  Past Medical History  Diagnosis Date  . stomach ca dx'd 2010    chemo/xrt comp 05/2009  . Hypertension   . Diabetes mellitus   . Asthma   . Sleep apnea   . Degenerative joint disease   . Hyperlipidemia   . GERD (gastroesophageal reflux disease)   . Neuropathy ([place])     bilateral hands and feet    Social History   Social History  . Marital Status: Divorced    Spouse Name: N/A  . Number of Children: N/A  . Years of Education: N/A   Occupational History  . Not on file.   Social History Main Topics  . Smoking status: Former Smoker -- 1.00 packs/day for 15 years    Types: Cigarettes    Start date: 09/27/1973    Quit date: 10/15/1988  . Smokeless tobacco: Never Used     Comment: quit smoking 14 years ago  . Alcohol Use: No  . Drug Use: No  . Sexual Activity: No   Other Topics Concern  . Not on file   Social History Narrative    Past Surgical History  Procedure Laterality Date  . Total knee arthroplasty      Left  . Total hip arthroplasty      Right  . Shoulder surgery      Left  . Appendectomy    . Cesarean section    . Hernia repair    . Colon surgery    . Esophagogastroduodenoscopy N/A 10/15/2012    Procedure:  ESOPHAGOGASTRODUODENOSCOPY (EGD);  Surgeon: Lear Ng, MD;  Location: [facility];  Service: Endoscopy;  Laterality: N/A;    Family History  Problem Relation Age of Onset  . Cancer Mother   . Alzheimer's disease Father   . Diabetes Brother   . Diabetes Maternal Grandmother   . Cancer Maternal Grandfather     lung  . Suicidality Neg Hx   . Depression Neg Hx   . Dementia Neg Hx   . Anxiety disorder Neg Hx     Allergies  Allergen Reactions  . Adhesive [Tape] Other (See Comments)    Burn Skin  . Codeine Other (See Comments)    paranoid  . Zocor [Simvastatin - High Dose] Other (See Comments)    Muscle spasms  . Penicillins Rash    Current Outpatient Prescriptions on File Prior to Visit  Medication Sig Dispense Refill  . aspirin 81 MG tablet Take 81 mg by mouth at bedtime.    Marland Kitchen azelastine (ASTELIN) 0.1 % nasal spray Place 1 spray into both nostrils 2 (two) times daily. Use in each nostril as directed 30 mL 12  .  DULoxetine (CYMBALTA) 60 MG capsule Take 1 capsule (60 mg total) by mouth 2 (two) times daily. 180 capsule 3  . gabapentin (NEURONTIN) 300 MG capsule Take 3 capsules (900 mg total) by mouth 3 (three) times daily. 270 capsule 5  . glimepiride (AMARYL) 4 MG tablet Take 1 tablet (4 mg total) by mouth 2 (two) times daily. **LAST REFILL**PT NEEDS APPT WITH DR GHERGHE** 90 tablet 0  . hydrochlorothiazide (HYDRODIURIL) 25 MG tablet TAKE 1 TABLET (25 MG TOTAL) BY MOUTH DAILY. 90 tablet 1  . lidocaine-prilocaine (EMLA) cream Apply 1 application topically as needed. 30 g 5  . LORazepam (ATIVAN) 0.5 MG tablet Take 1 tablet (0.5 mg total) by mouth at bedtime as needed for anxiety. 30 tablet 0  . metaxalone (SKELAXIN) 800 MG tablet Take 1 tablet (800 mg total) by mouth 4 (four) times daily. (Patient taking differently: Take 800 mg by mouth 4 (four) times daily as needed. ) 30 tablet 0  . metFORMIN (GLUCOPHAGE) 850 MG tablet Take 1 tablet (850 mg total) by mouth 2 (two) times  daily. Repeat labs are due now 180 tablet 0  . montelukast (SINGULAIR) 10 MG tablet Take 1 tablet (10 mg total) by mouth at bedtime. 90 tablet 3  . Multiple Vitamin (MULTIVITAMIN WITH MINERALS) TABS tablet Take 1 tablet by mouth daily.    Marland Kitchen oxyCODONE-acetaminophen (PERCOCET) 10-325 MG tablet Take 1 tablet by mouth 3 (three) times daily.      No current facility-administered medications on file prior to visit.    BP 110/71 mmHg  Pulse 89  Temp(Src) 99.4 F (37.4 C) (Oral)  Resp 16  Ht 5' 7"  (1.702 m)  Wt 257 lb 12.8 oz (116.937 kg)  BMI 40.37 kg/m2         Objective:   Physical Exam  Constitutional: She is oriented to person, place, and time. She appears well-developed and well-nourished.  HENT:  Head: Normocephalic and atraumatic.  Right Ear: Tympanic membrane and ear canal normal.  Left Ear: Tympanic membrane and ear canal normal.  Cardiovascular: Normal rate, regular rhythm and normal heart sounds.   No murmur heard. Pulmonary/Chest: Effort normal and breath sounds normal. No respiratory distress. She has no wheezes.  Musculoskeletal: She exhibits no edema.  Neurological: She is alert and oriented to person, place, and time.  Skin: Skin is warm and dry.  Psychiatric: She has a normal mood and affect. Her behavior is normal. Judgment and thought content normal.          Assessment & Plan:  Viral respiratory illness- rapid flu swab negative. cxr negative for pneumonia.  Advised pt on supportive measures and as follows:  Call if you develop fever >101, if symptoms worsen, or if you are not improved in 3 days.

## 2015-09-02 ENCOUNTER — Telehealth: Payer: Self-pay | Admitting: Family Medicine

## 2015-09-02 NOTE — Telephone Encounter (Signed)
Caller name: Beautiful   Relationship to patient: Self   Can be reached: [phone]   Reason for call: pt called in to see if we have her results back. Please advise and assist further.    Thanks.

## 2015-09-02 NOTE — Telephone Encounter (Signed)
Notified pt per CXR result note.

## 2015-09-11 ENCOUNTER — Telehealth: Payer: Self-pay | Admitting: Family Medicine

## 2015-09-11 DIAGNOSIS — E041 Nontoxic single thyroid nodule: Secondary | ICD-10-CM

## 2015-09-11 NOTE — Telephone Encounter (Signed)
Pt says that the pain clinic is going to be faxing over a form for her in regards to her on going pain. She says that they are suggesting that her PCP increase her dosage on pain med.     CB: [phone]

## 2015-09-15 NOTE — Telephone Encounter (Signed)
Patient checking on the status if prescription request received, requested patient to call pain management and have them re fax Rx to [phone] Attention Kim. Maudie Mercury is aware and awaiting form.

## 2015-09-17 ENCOUNTER — Other Ambulatory Visit: Payer: Self-pay | Admitting: Family Medicine

## 2015-09-17 NOTE — Telephone Encounter (Signed)
Patient goes to [facility] pain clinic. Message left for medical records to fax the last office note.      KP

## 2015-09-17 NOTE — Telephone Encounter (Signed)
Patient has been made aware and verbalized understanding, she wanted to know what to about repeating her [place] for her thyroid nodule. Ok per [employer]. Order in,    [place]

## 2015-09-17 NOTE — Telephone Encounter (Signed)
Spoke with patient and she said pain management has suggested that she increases the Gabapentin due to the increased joint pain, she is already on 300 mg TID and sometimes has to take it a forth time during the day. Dr.Jaffe prescribed the Rx but she will not be going back to see him because he does not communicate with her, she said he keeps telling her it's the diabetes and she also wanted to know if there was a test she can have done for the pain in her hands and feet.     KP

## 2015-09-17 NOTE — Telephone Encounter (Signed)
She is on a high dose already-- she can try to take a 4th at night for a while and see if that helps Neurology would be the one to do the testing on hands and feet if needed

## 2015-09-18 ENCOUNTER — Other Ambulatory Visit: Payer: Self-pay | Admitting: Family Medicine

## 2015-09-19 ENCOUNTER — Emergency Department (HOSPITAL_BASED_OUTPATIENT_CLINIC_OR_DEPARTMENT_OTHER)
Admission: EM | Admit: 2015-09-19 | Discharge: 2015-09-19 | Disposition: A | Discharge status: Home / Self Care | Payer: Medicare HMO | Attending: Emergency Medicine | Admitting: Emergency Medicine

## 2015-09-19 ENCOUNTER — Encounter (HOSPITAL_BASED_OUTPATIENT_CLINIC_OR_DEPARTMENT_OTHER): Payer: Self-pay

## 2015-09-19 ENCOUNTER — Emergency Department (HOSPITAL_BASED_OUTPATIENT_CLINIC_OR_DEPARTMENT_OTHER): Payer: Medicare HMO

## 2015-09-19 DIAGNOSIS — G629 Polyneuropathy, unspecified: Secondary | ICD-10-CM | POA: Diagnosis not present

## 2015-09-19 DIAGNOSIS — Z79899 Other long term (current) drug therapy: Secondary | ICD-10-CM | POA: Insufficient documentation

## 2015-09-19 DIAGNOSIS — R109 Unspecified abdominal pain: Secondary | ICD-10-CM | POA: Diagnosis present

## 2015-09-19 DIAGNOSIS — Z7982 Long term (current) use of aspirin: Secondary | ICD-10-CM | POA: Diagnosis not present

## 2015-09-19 DIAGNOSIS — Z85028 Personal history of other malignant neoplasm of stomach: Secondary | ICD-10-CM | POA: Diagnosis not present

## 2015-09-19 DIAGNOSIS — K219 Gastro-esophageal reflux disease without esophagitis: Secondary | ICD-10-CM | POA: Diagnosis not present

## 2015-09-19 DIAGNOSIS — Z87891 Personal history of nicotine dependence: Secondary | ICD-10-CM | POA: Diagnosis not present

## 2015-09-19 DIAGNOSIS — Z7984 Long term (current) use of oral hypoglycemic drugs: Secondary | ICD-10-CM | POA: Diagnosis not present

## 2015-09-19 DIAGNOSIS — R1011 Right upper quadrant pain: Secondary | ICD-10-CM | POA: Diagnosis not present

## 2015-09-19 DIAGNOSIS — Z880 Allergy status to penicillin: Secondary | ICD-10-CM | POA: Insufficient documentation

## 2015-09-19 DIAGNOSIS — E119 Type 2 diabetes mellitus without complications: Secondary | ICD-10-CM | POA: Insufficient documentation

## 2015-09-19 DIAGNOSIS — J45909 Unspecified asthma, uncomplicated: Secondary | ICD-10-CM | POA: Insufficient documentation

## 2015-09-19 DIAGNOSIS — K5900 Constipation, unspecified: Secondary | ICD-10-CM | POA: Diagnosis not present

## 2015-09-19 DIAGNOSIS — I10 Essential (primary) hypertension: Secondary | ICD-10-CM | POA: Insufficient documentation

## 2015-09-19 DIAGNOSIS — R112 Nausea with vomiting, unspecified: Secondary | ICD-10-CM | POA: Insufficient documentation

## 2015-09-19 LAB — COMPREHENSIVE METABOLIC PANEL
ALK PHOS: 69 U/L (ref 38–126)
ALT: 20 U/L (ref 14–54)
AST: 22 U/L (ref 15–41)
Albumin: 3.5 g/dL (ref 3.5–5.0)
Anion gap: 10 (ref 5–15)
BILIRUBIN TOTAL: 0.4 mg/dL (ref 0.3–1.2)
BUN: 21 mg/dL — AB (ref 6–20)
CALCIUM: 8.8 mg/dL — AB (ref 8.9–10.3)
CHLORIDE: 100 mmol/L — AB (ref 101–111)
CO2: 27 mmol/L (ref 22–32)
CREATININE: 1.18 mg/dL — AB (ref 0.44–1.00)
GFR, EST AFRICAN AMERICAN: 56 mL/min — AB (ref >60)
GFR, EST NON AFRICAN AMERICAN: 48 mL/min — AB (ref >60)
Glucose, Bld: 202 mg/dL — ABNORMAL HIGH (ref 65–99)
Potassium: 5.1 mmol/L (ref 3.5–5.1)
Sodium: 137 mmol/L (ref 135–145)
TOTAL PROTEIN: 7.3 g/dL (ref 6.5–8.1)

## 2015-09-19 LAB — CBC WITH DIFFERENTIAL/PLATELET
BASOS ABS: 0 10*3/uL (ref 0.0–0.1)
Basophils Relative: 0 %
EOS PCT: 5 %
Eosinophils Absolute: 0.4 10*3/uL (ref 0.0–0.7)
HEMATOCRIT: 37.7 % (ref 36.0–46.0)
HEMOGLOBIN: 12.7 g/dL (ref 12.0–15.0)
LYMPHS ABS: 2.7 10*3/uL (ref 0.7–4.0)
LYMPHS PCT: 34 %
MCH: 29.6 pg (ref 26.0–34.0)
MCHC: 33.7 g/dL (ref 30.0–36.0)
MCV: 87.9 fL (ref 78.0–100.0)
Monocytes Absolute: 0.7 10*3/uL (ref 0.1–1.0)
Monocytes Relative: 9 %
NEUTROS ABS: 4 10*3/uL (ref 1.7–7.7)
Neutrophils Relative %: 52 %
Platelets: 375 10*3/uL (ref 150–400)
RBC: 4.29 MIL/uL (ref 3.87–5.11)
RDW: 14.4 % (ref 11.5–15.5)
WBC: 7.7 10*3/uL (ref 4.0–10.5)

## 2015-09-19 LAB — URINALYSIS, ROUTINE W REFLEX MICROSCOPIC
BILIRUBIN URINE: NEGATIVE
Glucose, UA: NEGATIVE mg/dL
Hgb urine dipstick: NEGATIVE
KETONES UR: NEGATIVE mg/dL
LEUKOCYTES UA: NEGATIVE
NITRITE: NEGATIVE
PH: 7 (ref 5.0–8.0)
PROTEIN: NEGATIVE mg/dL
Specific Gravity, Urine: 1.014 (ref 1.005–1.030)

## 2015-09-19 LAB — LIPASE, BLOOD: LIPASE: 14 U/L (ref 11–51)

## 2015-09-19 MED ORDER — SODIUM CHLORIDE 0.9 % IV BOLUS (SEPSIS)
500.0000 mL | Freq: Once | INTRAVENOUS | Status: AC
  Administered 2015-09-19: 500 mL via INTRAVENOUS

## 2015-09-19 MED ORDER — MORPHINE SULFATE (PF) 4 MG/ML IV SOLN
4.0000 mg | Freq: Once | INTRAVENOUS | Status: AC
  Administered 2015-09-19: 4 mg via INTRAVENOUS
  Filled 2015-09-19: qty 1

## 2015-09-19 MED ORDER — ONDANSETRON HCL 4 MG/2ML IJ SOLN
4.0000 mg | Freq: Once | INTRAMUSCULAR | Status: AC
  Administered 2015-09-19: 4 mg via INTRAVENOUS
  Filled 2015-09-19: qty 2

## 2015-09-19 MED ORDER — OMEPRAZOLE 20 MG PO CPDR: 20.0000 mg | DELAYED_RELEASE_CAPSULE | Freq: Every day | ORAL | Status: DC

## 2015-09-19 NOTE — ED Notes (Signed)
MD at bedside discussing results with patient and family.

## 2015-09-19 NOTE — Discharge Instructions (Signed)

## 2015-09-19 NOTE — ED Notes (Signed)
Patient transported to CT 

## 2015-09-19 NOTE — ED Notes (Signed)
Pt transported to CT at this time.

## 2015-09-19 NOTE — ED Notes (Signed)
Patient here with right sided abdominal pain and left flank and left side pain since am, reports 1 episode of vomiting, denies diarrhea

## 2015-09-19 NOTE — ED Notes (Signed)
MD at bedside. 

## 2015-09-19 NOTE — ED Provider Notes (Signed)
CSN: [id number]     Arrival date & time 09/19/15  1353 History  By signing my name below, I, Altamease Oiler, attest that this documentation has been prepared under the direction and in the presence of Quintella Reichert, MD. Electronically Signed: Altamease Oiler, ED Scribe. 09/19/2015.4:34 PM   Chief Complaint  Patient presents with  . Abdominal Pain  . Flank Pain    The history is provided by the patient. No language interpreter was used.   [name] is a 65 y.o. female with history of gastric cancer s/p colon surgery, chemotherapy, and radiation who presents to the Emergency Department complaining of new, intermittent, "pushing", 8/10 in severity, right sided abdominal pain with onset last night. She also complains of sharp, intermittent,  left flank pain that radiates up the back. Associated symptoms include nausea, 1 episode of emesis, and constipation. Pt denies fever and dysuria. Past surgical history includes appendectomy, and hernia repair.   Past Medical History  Diagnosis Date  . stomach ca dx'd 2010    chemo/xrt comp 05/2009  . Hypertension   . Diabetes mellitus   . Asthma   . Sleep apnea   . Degenerative joint disease   . Hyperlipidemia   . GERD (gastroesophageal reflux disease)   . Neuropathy ([place])     bilateral hands and feet   Past Surgical History  Procedure Laterality Date  . Total knee arthroplasty      Left  . Total hip arthroplasty      Right  . Shoulder surgery      Left  . Appendectomy    . Cesarean section    . Hernia repair    . Colon surgery    . Esophagogastroduodenoscopy N/A 10/15/2012    Procedure: ESOPHAGOGASTRODUODENOSCOPY (EGD);  Surgeon: Lear Ng, MD;  Location: [facility];  Service: Endoscopy;  Laterality: N/A;   Family History  Problem Relation Age of Onset  . Cancer Mother   . Alzheimer's disease Father   . Diabetes Brother   . Diabetes Maternal Grandmother   . Cancer Maternal Grandfather     lung  . Suicidality Neg  Hx   . Depression Neg Hx   . Dementia Neg Hx   . Anxiety disorder Neg Hx    Social History  Substance Use Topics  . Smoking status: Former Smoker -- 1.00 packs/day for 15 years    Types: Cigarettes    Start date: 09/27/1973    Quit date: 10/15/1988  . Smokeless tobacco: Never Used     Comment: quit smoking 14 years ago  . Alcohol Use: No   OB History    Gravida Para Term Preterm AB TAB SAB Ectopic Multiple Living   4 4        4      Review of Systems  Constitutional: Negative for fever.  Cardiovascular: Negative for chest pain.  Gastrointestinal: Positive for nausea, vomiting, abdominal pain and constipation. Negative for diarrhea.  Genitourinary: Positive for flank pain. Negative for dysuria and hematuria.  All other systems reviewed and are negative.     Allergies  Adhesive; Codeine; Zocor; and Penicillins  Home Medications   Prior to Admission medications   Medication Sig Start Date End Date Taking? Authorizing Provider  aspirin 81 MG tablet Take 81 mg by mouth at bedtime.    Historical Provider, MD  azelastine (ASTELIN) 0.1 % nasal spray Place 1 spray into both nostrils 2 (two) times daily. Use in each nostril as directed 01/13/15   Kendrick Fries  R Lowne, DO  carvedilol (COREG) 12.5 MG tablet Take 12.5 mg by mouth every morning. 07/20/15   Historical Provider, MD  DULoxetine (CYMBALTA) 60 MG capsule Take 1 capsule (60 mg total) by mouth 2 (two) times daily. 06/09/15   Rosalita Chessman, DO  gabapentin (NEURONTIN) 300 MG capsule Take 3 capsules (900 mg total) by mouth 3 (three) times daily. 05/08/15   Pieter Partridge, DO  glimepiride (AMARYL) 4 MG tablet TAKE 1 TABLET BY MOUTH TWICE A DAY 09/17/15   Rosalita Chessman, DO  hydrochlorothiazide (HYDRODIURIL) 25 MG tablet TAKE 1 TABLET (25 MG TOTAL) BY MOUTH DAILY. 06/17/15   Rosalita Chessman, DO  lidocaine-prilocaine (EMLA) cream Apply 1 application topically as needed. 06/29/15   Volanda Napoleon, MD  LORazepam (ATIVAN) 0.5 MG tablet Take 1  tablet (0.5 mg total) by mouth at bedtime as needed for anxiety. 08/21/15   Rosalita Chessman, DO  metaxalone (SKELAXIN) 800 MG tablet Take 1 tablet (800 mg total) by mouth 4 (four) times daily. Patient taking differently: Take 800 mg by mouth 4 (four) times daily as needed.  04/06/15   Rosalita Chessman, DO  metFORMIN (GLUCOPHAGE) 850 MG tablet Take 1 tablet (850 mg total) by mouth 2 (two) times daily. Repeat labs are due now 07/07/15   Rosalita Chessman, DO  montelukast (SINGULAIR) 10 MG tablet Take 1 tablet (10 mg total) by mouth at bedtime. 04/29/15   Rosalita Chessman, DO  Multiple Vitamin (MULTIVITAMIN WITH MINERALS) TABS tablet Take 1 tablet by mouth daily.    Historical Provider, MD  omeprazole (PRILOSEC) 20 MG capsule Take 1 capsule (20 mg total) by mouth daily. 09/19/15   Quintella Reichert, MD  oxyCODONE-acetaminophen (PERCOCET) 10-325 MG tablet Take 1 tablet by mouth 3 (three) times daily.     Historical Provider, MD   BP 135/104 mmHg  Pulse 94  Temp(Src) 98.6 F (37 C) (Oral)  Resp 20  Ht 5' 7"  (1.702 m)  Wt 257 lb (116.574 kg)  BMI 40.24 kg/m2  SpO2 98% Physical Exam  Constitutional: She is oriented to person, place, and time. She appears well-developed and well-nourished.  HENT:  Head: Normocephalic and atraumatic.  Cardiovascular: Normal rate and regular rhythm.   No murmur heard. Pulmonary/Chest: Effort normal and breath sounds normal. No respiratory distress.  Abdominal: Soft. There is tenderness. There is no rebound and no guarding.  Mild lower abdominal tenderness Moderate right upper abdominal tenderness No CVA tenderness  Musculoskeletal: She exhibits no edema or tenderness.  Neurological: She is alert and oriented to person, place, and time.  Skin: Skin is warm and dry.  Psychiatric: She has a normal mood and affect. Her behavior is normal.  Nursing note and vitals reviewed.   ED Course  Procedures (including critical care time) DIAGNOSTIC STUDIES: Oxygen Saturation is 98%  on RA,  normal by my interpretation.    COORDINATION OF CARE: 3:32 PM Discussed treatment plan which includes lab work, EKG, CT renal stone study, Zofran, morphine, and IVF with pt at bedside and pt agreed to plan.  4:32 PM I re-evaluated the patient and provided an update on the results of her lab work and CT scan.    Labs Review Labs Reviewed  COMPREHENSIVE METABOLIC PANEL - Abnormal; Notable for the following:    Chloride 100 (*)    Glucose, Bld 202 (*)    BUN 21 (*)    Creatinine, Ser 1.18 (*)    Calcium 8.8 (*)  GFR calc non Af Amer 48 (*)    GFR calc Af Amer 56 (*)    All other components within normal limits  URINALYSIS, ROUTINE W REFLEX MICROSCOPIC (NOT AT [facility])  CBC WITH DIFFERENTIAL/PLATELET  LIPASE, BLOOD    Imaging Review Ct Renal Stone Study  09/19/2015  CLINICAL DATA:  65 year old presenting with 1 week history of right upper quadrant and left upper quadrant abdominal pain and left flank pain associated with nausea and vomiting. Patient states her urine color has been dark. Surgical history includes partial gastrectomy for gastric cancer with gastrojejunostomy, appendectomy and hernia repair. EXAM: CT ABDOMEN AND PELVIS WITHOUT CONTRAST TECHNIQUE: Multidetector CT imaging of the abdomen and pelvis was performed following the standard protocol without IV contrast. COMPARISON:  10/15/2013 and earlier. FINDINGS: Beam hardening streak artifact from the patient's right hip prosthesis obscures portions of the lower pelvis. Lower chest:  Heart size normal.  Visualized lung bases clear. Hepatobiliary: Normal unenhanced appearance of the liver. Borderline gallbladder distention without evidence of cholelithiasis or acute cholecystitis. Pancreas: Marked atrophy without focal parenchymal abnormality or inflammation. Spleen: Normal unenhanced appearance. Adrenals/Urinary Tract: Normal appearing adrenal glands. Scarring involving the upper pole of the mildly atrophic left kidney,  unchanged. No evidence of urinary tract calculi or obstruction on either side. Allowing for the unenhanced technique, no focal parenchymal abnormality involving either kidney. Urinary bladder unremarkable. Stomach/Bowel: Prior distal gastrectomy and gastrojejunostomy without complication. Small hiatal hernia. Circumferential thickening of the wall of the distal esophagus. Normal-appearing small bowel. Mobile cecum extending to the midline of the upper pelvis. Moderate diffuse colonic stool burden. Sigmoid colon diverticulosis without evidence of acute diverticulitis. Surgically absent appendix. Vascular/Lymphatic: Mild aortoiliofemoral atherosclerosis without aneurysm. No pathologic lymphadenopathy. Reproductive: Normal-appearing uterus and ovaries without evidence of adnexal mass. Other: Prior anterior abdominal wall hernia repair without recurrence. Chronic scarring involving the right rectus muscle above the umbilicus at the site of the prior surgical scar. Musculoskeletal: Prior right hip arthroplasty with anatomic alignment. Degenerative disc disease and spondylosis involving the lower thoracic spine. Severe facet degenerative changes at L4-5 and L5-S1. No acute abnormalities. IMPRESSION: 1. No acute abnormalities involving the abdomen or pelvis. 2. No evidence of urinary tract calculi or obstruction. 3. Small hiatal hernia. Circumferential wall thickening involving the visualized distal esophagus likely indicates chronic GE reflux disease. 4. Prior distal gastrectomy and gastrojejunostomy without complications. 5. Sigmoid colon diverticulosis without evidence of acute diverticulitis. 6. Mild left renal atrophy with scarring involving the upper pole, unchanged. Electronically Signed   By: Evangeline Dakin M.D.   On: 09/19/2015 16:16   US Abdomen Limited Ruq  09/19/2015  CLINICAL DATA:  Right upper quadrant pain radiating to the right flank. Nausea and vomiting. EXAM: US ABDOMEN LIMITED - RIGHT UPPER QUADRANT  COMPARISON:  CT abdomen pelvis earlier same date FINDINGS: Gallbladder: The gallbladder is mildly distended. No gallbladder wall thickening or pericholecystic fluid. No cholelithiasis. Negative sonographic Murphy's sign. Common bile duct: Diameter: 4.5 mm Liver: Diffusely increased in echogenicity.  No focal lesion identified. IMPRESSION: No sonographic evidence to suggest acute cholecystitis. Hepatic steatosis. Electronically Signed   By: Lovey Newcomer M.D.   On: 09/19/2015 17:16   I personally reviewed and evaluated these images and lab results as a part of my medical decision-making.  ED ECG REPORT   Date: 09/19/2015  Rate: 104  Rhythm: sinus tachycardia  QRS Axis: normal  Intervals: normal  ST/T Wave abnormalities: normal  Conduction Disutrbances:none  Narrative Interpretation:   Old EKG Reviewed: none available  I have personally reviewed the EKG tracing and agree with the computerized printout as noted.   MDM   Final diagnoses:  RUQ pain  Left flank pain   Patient here for evaluation of abdominal pain that is on the right and left side of her abdomen. Examination she has focal right-sided tenderness. CT scan negative for any acute findings. Ultrasound obtained given her dilated gallbladder on ultrasound. And her ultrasound is negative for acute disease. BMP demonstrates stable renal insufficiency. Discussed with patient unclear source of her symptoms. Recommend outpatient follow-up. Home care and return precautions were discussed. Treating for possible gastritis with omeprazole.  I personally performed the services described in this documentation, which was scribed in my presence. The recorded information has been reviewed and is accurate.    Quintella Reichert, MD 09/20/15 [phone]

## 2015-09-21 ENCOUNTER — Ambulatory Visit (HOSPITAL_BASED_OUTPATIENT_CLINIC_OR_DEPARTMENT_OTHER)
Admission: RE | Admit: 2015-09-21 | Discharge: 2015-09-21 | Disposition: A | Discharge status: Home / Self Care | Payer: Medicare HMO | Source: Ambulatory Visit | Attending: Family Medicine | Admitting: Family Medicine

## 2015-09-21 ENCOUNTER — Telehealth: Payer: Self-pay | Admitting: Family Medicine

## 2015-09-21 DIAGNOSIS — E041 Nontoxic single thyroid nodule: Secondary | ICD-10-CM | POA: Diagnosis present

## 2015-09-21 DIAGNOSIS — E042 Nontoxic multinodular goiter: Secondary | ICD-10-CM | POA: Insufficient documentation

## 2015-09-21 NOTE — Telephone Encounter (Signed)
Error

## 2015-09-29 ENCOUNTER — Ambulatory Visit: Payer: Medicare HMO | Admitting: Podiatry

## 2015-10-08 ENCOUNTER — Ambulatory Visit: Payer: Self-pay | Admitting: Hematology & Oncology

## 2015-10-08 ENCOUNTER — Other Ambulatory Visit: Payer: Medicare HMO

## 2015-10-13 ENCOUNTER — Telehealth: Payer: Self-pay | Admitting: Family Medicine

## 2015-10-13 NOTE — Telephone Encounter (Signed)
Please advise      KP 

## 2015-10-13 NOTE — Telephone Encounter (Signed)
She needs to be seen first

## 2015-10-13 NOTE — Telephone Encounter (Signed)
Relation to QX:IHWT Call back number: [phone]   Reason for call:  Patient was adamant about seeing PCP only, PCP is completley booked next available is not until Monday. Patient would like an order for U/S for abdominal discomfort, bloating, and feeling heavy.  patient did state if PCP suggest an appointment she will see another MD in the office. Please advise

## 2015-10-14 NOTE — Telephone Encounter (Signed)
You can put her in tomorrow .    KP

## 2015-10-14 NOTE — Telephone Encounter (Signed)
Patient scheduled for 10/15/2015 at 2pm

## 2015-10-15 ENCOUNTER — Encounter: Payer: Self-pay | Admitting: Family Medicine

## 2015-10-15 ENCOUNTER — Encounter: Payer: Self-pay | Admitting: Internal Medicine

## 2015-10-15 ENCOUNTER — Ambulatory Visit (INDEPENDENT_AMBULATORY_CARE_PROVIDER_SITE_OTHER): Payer: Medicare HMO | Admitting: Family Medicine

## 2015-10-15 VITALS — BP 113/73 | HR 101 | Temp 98.6°F | Resp 16 | Wt 254.0 lb

## 2015-10-15 DIAGNOSIS — Z1159 Encounter for screening for other viral diseases: Secondary | ICD-10-CM

## 2015-10-15 DIAGNOSIS — D509 Iron deficiency anemia, unspecified: Secondary | ICD-10-CM

## 2015-10-15 DIAGNOSIS — R1011 Right upper quadrant pain: Secondary | ICD-10-CM | POA: Diagnosis not present

## 2015-10-15 MED ORDER — PANTOPRAZOLE SODIUM 40 MG PO TBEC: 40.0000 mg | DELAYED_RELEASE_TABLET | Freq: Every day | ORAL | Status: DC

## 2015-10-15 NOTE — Progress Notes (Signed)
Pre visit review using our clinic review tool, if applicable. No additional management support is needed unless otherwise documented below in the visit note. 

## 2015-10-15 NOTE — Patient Instructions (Signed)

## 2015-10-15 NOTE — Progress Notes (Signed)
Patient ID: JAZZMYN FILION, female    DOB: 1951/05/01  Age: 65 y.o. MRN: [id number]    Subjective:  Subjective HPI HASNA STEFANIK presents for abd bloating and pain x 1 week.  No NVD.    Review of Systems  Constitutional: Negative for diaphoresis, appetite change, fatigue and unexpected weight change.  Eyes: Negative for pain, redness and visual disturbance.  Respiratory: Negative for cough, chest tightness, shortness of breath and wheezing.   Cardiovascular: Negative for chest pain, palpitations and leg swelling.  Endocrine: Negative for cold intolerance, heat intolerance, polydipsia, polyphagia and polyuria.  Genitourinary: Negative for dysuria, frequency and difficulty urinating.  Neurological: Negative for dizziness, light-headedness, numbness and headaches.    History Past Medical History  Diagnosis Date  . stomach ca dx'd 2010    chemo/xrt comp 05/2009  . Hypertension   . Diabetes mellitus   . Asthma   . Sleep apnea   . Degenerative joint disease   . Hyperlipidemia   . GERD (gastroesophageal reflux disease)   . Neuropathy ([place])     bilateral hands and feet    She has past surgical history that includes Total knee arthroplasty; Total hip arthroplasty; Shoulder surgery; Appendectomy; Cesarean section; Hernia repair; Colon surgery; and Esophagogastroduodenoscopy (N/A, 10/15/2012).   Her family history includes Alzheimer's disease in her father; Cancer in her maternal grandfather and mother; Diabetes in her brother and maternal grandmother. There is no history of Suicidality, Depression, Dementia, or Anxiety disorder.She reports that she quit smoking about 27 years ago. Her smoking use included Cigarettes. She started smoking about 42 years ago. She has a 15 pack-year smoking history. She has never used smokeless tobacco. She reports that she does not drink alcohol or use illicit drugs.  Current Outpatient Prescriptions on File Prior to Visit  Medication Sig Dispense Refill  .  aspirin 81 MG tablet Take 81 mg by mouth at bedtime.    Marland Kitchen azelastine (ASTELIN) 0.1 % nasal spray Place 1 spray into both nostrils 2 (two) times daily. Use in each nostril as directed 30 mL 12  . carvedilol (COREG) 12.5 MG tablet Take 12.5 mg by mouth every morning.  1  . DULoxetine (CYMBALTA) 60 MG capsule Take 1 capsule (60 mg total) by mouth 2 (two) times daily. 180 capsule 3  . gabapentin (NEURONTIN) 300 MG capsule Take 3 capsules (900 mg total) by mouth 3 (three) times daily. 270 capsule 5  . glimepiride (AMARYL) 4 MG tablet TAKE 1 TABLET BY MOUTH TWICE A DAY 180 tablet 1  . hydrochlorothiazide (HYDRODIURIL) 25 MG tablet TAKE 1 TABLET (25 MG TOTAL) BY MOUTH DAILY. 90 tablet 1  . lidocaine-prilocaine (EMLA) cream Apply 1 application topically as needed. 30 g 5  . LORazepam (ATIVAN) 0.5 MG tablet Take 1 tablet (0.5 mg total) by mouth at bedtime as needed for anxiety. 30 tablet 0  . metaxalone (SKELAXIN) 800 MG tablet Take 1 tablet (800 mg total) by mouth 4 (four) times daily. (Patient taking differently: Take 800 mg by mouth 4 (four) times daily as needed. ) 30 tablet 0  . metFORMIN (GLUCOPHAGE) 850 MG tablet Take 1 tablet (850 mg total) by mouth 2 (two) times daily. Repeat labs are due now 180 tablet 0  . montelukast (SINGULAIR) 10 MG tablet Take 1 tablet (10 mg total) by mouth at bedtime. 90 tablet 3  . Multiple Vitamin (MULTIVITAMIN WITH MINERALS) TABS tablet Take 1 tablet by mouth daily.    Marland Kitchen oxyCODONE-acetaminophen (PERCOCET) 10-325 MG tablet Take  1 tablet by mouth 3 (three) times daily.      No current facility-administered medications on file prior to visit.     Objective:  Objective Physical Exam  Constitutional: She is oriented to person, place, and time. She appears well-developed and well-nourished.  HENT:  Head: Normocephalic and atraumatic.  Eyes: Conjunctivae and EOM are normal.  Neck: Normal range of motion. Neck supple. No JVD present. Carotid bruit is not present. No  thyromegaly present.  Cardiovascular: Normal rate, regular rhythm and normal heart sounds.   No murmur heard. Pulmonary/Chest: Effort normal and breath sounds normal. No respiratory distress. She has no wheezes. She has no rales. She exhibits no tenderness.  Musculoskeletal: She exhibits no edema.  Neurological: She is alert and oriented to person, place, and time.  Psychiatric: She has a normal mood and affect.  Nursing note and vitals reviewed.  BP 113/73 mmHg  Pulse 101  Temp(Src) 98.6 F (37 C) (Oral)  Resp 16  Wt 254 lb (115.214 kg) Wt Readings from Last 3 Encounters:  10/16/15 252 lb (114.306 kg)  10/15/15 254 lb (115.214 kg)  09/19/15 257 lb (116.574 kg)     Lab Results  Component Value Date   WBC 9.7 10/15/2015   HGB 12.5 10/15/2015   HCT 39.0 10/15/2015   PLT 333.0 10/15/2015   GLUCOSE 243* 10/15/2015   CHOL 185 07/07/2015   TRIG 144 07/07/2015   HDL 58 07/07/2015   LDLCALC 98 07/07/2015   ALT 21 10/15/2015   AST 19 10/15/2015   NA 141 10/15/2015   K 5.4* 10/15/2015   CL 103 10/15/2015   CREATININE 1.46* 10/15/2015   BUN 19 10/15/2015   CO2 30 10/15/2015   TSH 2.435 07/31/2014   INR 0.98 11/05/2010   HGBA1C 10.9* 07/07/2015   MICROALBUR 0.3 04/06/2015    US Soft Tissue Head/neck  09/21/2015  CLINICAL DATA:  Follow-up thyroid nodules EXAM: THYROID ULTRASOUND TECHNIQUE: Ultrasound examination of the thyroid gland and adjacent soft tissues was performed. COMPARISON:  09/27/2014 FINDINGS: Right thyroid lobe Measurements: 4.4 x 2.5 x 2.5 cm. 12 x 13 x 10 mm upper pole hypoechoic nodule with calcification. Previously, this measured up to 2.0 cm. Complex mid lobe nodule measures 25 x 22 x 18 mm and previously measured up to 35 mm. Smaller lower pole hypoechoic nodule measures 10 mm. Left thyroid lobe Measurements: 5.2 x 1.6 x 2.0 cm. Mid lobe nodule measures 29 x 17 x 15 mm. Previously, it measured up to 30 mm. 8 mm upper pole complex nodule. Lower pole hypoechoic  nodule with calcification measures 18 x 15 x 14 mm. Previously, it measured up to 20 mm. Isthmus Thickness: 2 mm.  5 mm nodule. Lymphadenopathy None visualized. IMPRESSION: Multiple bilateral nodules are smaller than on prior studies. Stability or regression over 2 years supports benign etiology. Electronically Signed   By: Marybelle Killings M.D.   On: 09/21/2015 12:53     Assessment & Plan:  Plan I have discontinued Ms. Scarola's omeprazole. I am also having her start on pantoprazole. Additionally, I am having her maintain her multivitamin with minerals, azelastine, aspirin, metaxalone, montelukast, gabapentin, DULoxetine, hydrochlorothiazide, lidocaine-prilocaine, oxyCODONE-acetaminophen, metFORMIN, LORazepam, carvedilol, and glimepiride.  Meds ordered this encounter  Medications  . pantoprazole (PROTONIX) 40 MG tablet    Sig: Take 1 tablet (40 mg total) by mouth daily.    Dispense:  30 tablet    Refill:  3    Problem List Items Addressed This Visit  None    Visit Diagnoses    RUQ pain    -  Primary    Relevant Medications    pantoprazole (PROTONIX) 40 MG tablet    Other Relevant Orders    NM HEPATOBILIARY INCLUDING GB    CT Abdomen Pelvis W Contrast (Completed)    Right upper quadrant pain        Relevant Medications    pantoprazole (PROTONIX) 40 MG tablet    Other Relevant Orders    CBC with Differential/Platelet (Completed)    Comprehensive metabolic panel (Completed)    Amylase (Completed)    Lipase (Completed)    H. pylori antibody, IgG (Completed)    IBC panel (Completed)    Ferritin (Completed)    CT Abdomen Pelvis W Contrast (Completed)    Anemia, iron deficiency        Relevant Medications    pantoprazole (PROTONIX) 40 MG tablet    Other Relevant Orders    CBC with Differential/Platelet (Completed)    IBC panel (Completed)    Ferritin (Completed)    Need for hepatitis C screening test        Relevant Orders    Hepatitis C antibody (Completed)       Follow-up:  Return in about 3 months (around 01/15/2016), or if symptoms worsen or fail to improve.  Garnet Koyanagi, DO

## 2015-10-16 ENCOUNTER — Ambulatory Visit (HOSPITAL_BASED_OUTPATIENT_CLINIC_OR_DEPARTMENT_OTHER)
Admission: RE | Admit: 2015-10-16 | Discharge: 2015-10-16 | Disposition: A | Discharge status: Home / Self Care | Payer: Medicare HMO | Source: Ambulatory Visit | Attending: Family Medicine | Admitting: Family Medicine

## 2015-10-16 ENCOUNTER — Other Ambulatory Visit: Payer: Self-pay | Admitting: Family Medicine

## 2015-10-16 ENCOUNTER — Encounter: Payer: Self-pay | Admitting: Hematology & Oncology

## 2015-10-16 ENCOUNTER — Encounter (HOSPITAL_BASED_OUTPATIENT_CLINIC_OR_DEPARTMENT_OTHER): Payer: Self-pay

## 2015-10-16 ENCOUNTER — Ambulatory Visit (HOSPITAL_BASED_OUTPATIENT_CLINIC_OR_DEPARTMENT_OTHER): Payer: Medicare HMO | Admitting: Hematology & Oncology

## 2015-10-16 ENCOUNTER — Other Ambulatory Visit: Payer: Medicare HMO

## 2015-10-16 VITALS — BP 123/68 | HR 100 | Temp 98.8°F | Resp 16 | Ht 67.0 in | Wt 252.0 lb

## 2015-10-16 DIAGNOSIS — Z85028 Personal history of other malignant neoplasm of stomach: Secondary | ICD-10-CM | POA: Diagnosis not present

## 2015-10-16 DIAGNOSIS — R1011 Right upper quadrant pain: Secondary | ICD-10-CM

## 2015-10-16 DIAGNOSIS — R109 Unspecified abdominal pain: Secondary | ICD-10-CM | POA: Diagnosis not present

## 2015-10-16 DIAGNOSIS — K760 Fatty (change of) liver, not elsewhere classified: Secondary | ICD-10-CM | POA: Insufficient documentation

## 2015-10-16 DIAGNOSIS — Z794 Long term (current) use of insulin: Secondary | ICD-10-CM

## 2015-10-16 DIAGNOSIS — C169 Malignant neoplasm of stomach, unspecified: Secondary | ICD-10-CM

## 2015-10-16 DIAGNOSIS — C168 Malignant neoplasm of overlapping sites of stomach: Secondary | ICD-10-CM

## 2015-10-16 DIAGNOSIS — E0811 Diabetes mellitus due to underlying condition with ketoacidosis with coma: Secondary | ICD-10-CM

## 2015-10-16 LAB — CBC WITH DIFFERENTIAL/PLATELET
BASOS ABS: 0.1 10*3/uL (ref 0.0–0.1)
BASOS PCT: 0.5 % (ref 0.0–3.0)
EOS ABS: 0.2 10*3/uL (ref 0.0–0.7)
Eosinophils Relative: 2.5 % (ref 0.0–5.0)
HEMATOCRIT: 39 % (ref 36.0–46.0)
Hemoglobin: 12.5 g/dL (ref 12.0–15.0)
LYMPHS ABS: 2.3 10*3/uL (ref 0.7–4.0)
LYMPHS PCT: 23.8 % (ref 12.0–46.0)
MCHC: 32.1 g/dL (ref 30.0–36.0)
MCV: 90.3 fl (ref 78.0–100.0)
MONO ABS: 0.5 10*3/uL (ref 0.1–1.0)
Monocytes Relative: 5.6 % (ref 3.0–12.0)
NEUTROS ABS: 6.6 10*3/uL (ref 1.4–7.7)
Neutrophils Relative %: 67.6 % (ref 43.0–77.0)
PLATELETS: 333 10*3/uL (ref 150.0–400.0)
RBC: 4.32 Mil/uL (ref 3.87–5.11)
RDW: 15.3 % (ref 11.5–15.5)
WBC: 9.7 10*3/uL (ref 4.0–10.5)

## 2015-10-16 LAB — IBC PANEL
Iron: 47 ug/dL (ref 42–145)
SATURATION RATIOS: 19.6 % — AB (ref 20.0–50.0)
Transferrin: 171 mg/dL — ABNORMAL LOW (ref 212.0–360.0)

## 2015-10-16 LAB — COMPREHENSIVE METABOLIC PANEL
ALT: 21 U/L (ref 0–35)
AST: 19 U/L (ref 0–37)
Albumin: 3.4 g/dL — ABNORMAL LOW (ref 3.5–5.2)
Alkaline Phosphatase: 73 U/L (ref 39–117)
BILIRUBIN TOTAL: 0.3 mg/dL (ref 0.2–1.2)
BUN: 19 mg/dL (ref 6–23)
CALCIUM: 9.5 mg/dL (ref 8.4–10.5)
CO2: 30 meq/L (ref 19–32)
Chloride: 103 mEq/L (ref 96–112)
Creatinine, Ser: 1.46 mg/dL — ABNORMAL HIGH (ref 0.40–1.20)
GFR: 46.45 mL/min — AB (ref >60.00)
Glucose, Bld: 243 mg/dL — ABNORMAL HIGH (ref 70–99)
Potassium: 5.4 mEq/L — ABNORMAL HIGH (ref 3.5–5.1)
Sodium: 141 mEq/L (ref 135–145)
Total Protein: 7.1 g/dL (ref 6.0–8.3)

## 2015-10-16 LAB — H. PYLORI ANTIBODY, IGG: H PYLORI IGG: NEGATIVE

## 2015-10-16 LAB — LIPASE: LIPASE: 1 U/L — AB (ref 11.0–59.0)

## 2015-10-16 LAB — HEPATITIS C ANTIBODY: HCV Ab: NEGATIVE

## 2015-10-16 LAB — AMYLASE: Amylase: 61 U/L (ref 27–131)

## 2015-10-16 LAB — FERRITIN: Ferritin: 292.5 ng/mL — ABNORMAL HIGH (ref 10.0–291.0)

## 2015-10-16 MED ORDER — IOPAMIDOL (ISOVUE-300) INJECTION 61%
100.0000 mL | Freq: Once | INTRAVENOUS | Status: AC | PRN
  Administered 2015-10-16: 100 mL via INTRAVENOUS

## 2015-10-16 NOTE — Progress Notes (Signed)
Hematology and Oncology Follow Up Visit  [name] [id number] 08-Aug-1950 65 y.o. 10/16/2015   Principle Diagnosis:   Stage IB (T1, N1, M0) adenocarcinoma of the stomach Non-insulin-dependent diabetes   Current Therapy:   Observation     Interim History:  Ms.  Mendosa is doing a lot worse. She's been having this abdominal pain. It has been going on for a couple months. She has not lost any weight. There is no vomiting. She's had no obvious change in bowel or bladder habits. She's had no fever.  There's been no cough or shortness of breath. She's had no rashes. She's had no leg swelling.   She does have diabetes. She's been doing pre-well with this.  She is on quite a few medications.  Her iron studies today show a ferritin of 292. Her iron saturation is 20%. Her total iron is 47.   Overall, performance status is ECOG 2   Medications:  Current outpatient prescriptions:  .  aspirin 81 MG tablet, Take 81 mg by mouth at bedtime., Disp: , Rfl:  .  azelastine (ASTELIN) 0.1 % nasal spray, Place 1 spray into both nostrils 2 (two) times daily. Use in each nostril as directed, Disp: 30 mL, Rfl: 12 .  carvedilol (COREG) 12.5 MG tablet, Take 12.5 mg by mouth every morning., Disp: , Rfl: 1 .  DULoxetine (CYMBALTA) 60 MG capsule, Take 1 capsule (60 mg total) by mouth 2 (two) times daily., Disp: 180 capsule, Rfl: 3 .  gabapentin (NEURONTIN) 300 MG capsule, Take 3 capsules (900 mg total) by mouth 3 (three) times daily., Disp: 270 capsule, Rfl: 5 .  glimepiride (AMARYL) 4 MG tablet, TAKE 1 TABLET BY MOUTH TWICE A DAY, Disp: 180 tablet, Rfl: 1 .  hydrochlorothiazide (HYDRODIURIL) 25 MG tablet, TAKE 1 TABLET (25 MG TOTAL) BY MOUTH DAILY., Disp: 90 tablet, Rfl: 1 .  lidocaine-prilocaine (EMLA) cream, Apply 1 application topically as needed., Disp: 30 g, Rfl: 5 .  LORazepam (ATIVAN) 0.5 MG tablet, Take 1 tablet (0.5 mg total) by mouth at bedtime as needed for anxiety., Disp: 30 tablet, Rfl: 0 .   metaxalone (SKELAXIN) 800 MG tablet, Take 1 tablet (800 mg total) by mouth 4 (four) times daily. (Patient taking differently: Take 800 mg by mouth 4 (four) times daily as needed. ), Disp: 30 tablet, Rfl: 0 .  metFORMIN (GLUCOPHAGE) 850 MG tablet, Take 1 tablet (850 mg total) by mouth 2 (two) times daily. Repeat labs are due now, Disp: 180 tablet, Rfl: 0 .  montelukast (SINGULAIR) 10 MG tablet, Take 1 tablet (10 mg total) by mouth at bedtime., Disp: 90 tablet, Rfl: 3 .  Multiple Vitamin (MULTIVITAMIN WITH MINERALS) TABS tablet, Take 1 tablet by mouth daily., Disp: , Rfl:  .  oxyCODONE-acetaminophen (PERCOCET) 10-325 MG tablet, Take 1 tablet by mouth 3 (three) times daily. , Disp: , Rfl:  .  pantoprazole (PROTONIX) 40 MG tablet, Take 1 tablet (40 mg total) by mouth daily., Disp: 30 tablet, Rfl: 3 .  carvedilol (COREG) 12.5 MG tablet, TAKE 1 TABLET BY MOUTH BY EVERY MORNING, Disp: 90 tablet, Rfl: 0 .  hydrochlorothiazide (HYDRODIURIL) 25 MG tablet, TAKE 1 TABLET (25 MG TOTAL) BY MOUTH DAILY., Disp: 90 tablet, Rfl: 0  Allergies:  Allergies  Allergen Reactions  . Adhesive [Tape] Other (See Comments)    Burn Skin  . Codeine Other (See Comments)    paranoid  . Zocor [Simvastatin - High Dose] Other (See Comments)    Muscle spasms  .  Penicillins Rash    Past Medical History, Surgical history, Social history, and Family History were reviewed and updated.  Review of Systems: As above  Physical Exam:  height is 5' 7"  (1.702 m) and weight is 252 lb (114.306 kg). Her oral temperature is 98.8 F (37.1 C). Her blood pressure is 123/68 and her pulse is 100. Her respiration is 16.   Obese African American female. Head and neck exam shows no ocular or oral lesions. She has no palpable cervical or supraclavicular lymph nodes. Lungs are clear. Cardiac exam regular rate and rhythm with no murmurs, rubs or bruits. Abdomen is soft. She is moderately obese. She has a well-healed laparotomy scar. There is no  fluid wave. There is no palpable liver or spleen tip. Back exam shows no tenderness over the spine ribs or hips. Extremities shows no clubbing cyanosis or edema. Skin exam no rashes. Neurological exam is nonfocal.  Lab Results  Component Value Date   WBC 9.7 10/15/2015   HGB 12.5 10/15/2015   HCT 39.0 10/15/2015   MCV 90.3 10/15/2015   PLT 333.0 10/15/2015     Chemistry      Component Value Date/Time   NA 141 10/15/2015 1506   NA 141 01/13/2015 1030   NA 146* 09/25/2014 0933   K 5.4* 10/15/2015 1506   K 4.9 01/13/2015 1030   K 5.1* 09/25/2014 0933   CL 103 10/15/2015 1506   CL 102 09/25/2014 0933   CO2 30 10/15/2015 1506   CO2 28 01/13/2015 1030   CO2 30 09/25/2014 0933   BUN 19 10/15/2015 1506   BUN 18.7 01/13/2015 1030   BUN 15 09/25/2014 0933   CREATININE 1.46* 10/15/2015 1506   CREATININE 1.2* 01/13/2015 1030   CREATININE 1.10 10/22/2014 1117      Component Value Date/Time   CALCIUM 9.5 10/15/2015 1506   CALCIUM 9.7 01/13/2015 1030   CALCIUM 9.5 09/25/2014 0933   ALKPHOS 73 10/15/2015 1506   ALKPHOS 70 01/13/2015 1030   ALKPHOS 82 09/25/2014 0933   AST 19 10/15/2015 1506   AST 16 01/13/2015 1030   AST 20 09/25/2014 0933   ALT 21 10/15/2015 1506   ALT 17 01/13/2015 1030   ALT 16 09/25/2014 0933   BILITOT 0.3 10/15/2015 1506   BILITOT 0.35 01/13/2015 1030   BILITOT 0.50 09/25/2014 0933     .   Impression and Plan: [name] is 65 year old African [place] female with stage IB stomach cancer. She underwent resection. She then had adjuvant radiation and chemotherapy. She completed this about 7 years ago.  The CT scan that she had done today is somewhat ambiguous. I have no clue what the radiologist means by "2 zones of more prominent low-attenuation". Of course, he recommended a liver MRI.  I really think that she needs an upper endoscopy. I think that this probably would be the best way to try to evaluate her abdominal issues. It almost sounds like she may have  Helicobacter.  We will flush her Port-A-Cath.  We will have her back in 4 months for another visit and flush. Volanda Napoleon, MD 3/24/20172:40 PM

## 2015-10-16 NOTE — Progress Notes (Signed)
Patient refused port access for flush. Patient states she lab work on 10/15/15 which are currently pending. RLB

## 2015-10-20 ENCOUNTER — Encounter: Payer: Self-pay | Admitting: Family Medicine

## 2015-10-23 ENCOUNTER — Telehealth: Payer: Self-pay | Admitting: Family Medicine

## 2015-10-23 NOTE — Telephone Encounter (Signed)
Pt says that she is having a test done on 10/28/15. She would like a call back to discuss further.    CB: [phone]

## 2015-10-23 NOTE — Telephone Encounter (Signed)
Patient had questions about the HIDA scan, we discussed the procedure, and she verbalized understanding. She is ok with having it done, I reviewed the time and date and she agreed.    KP

## 2015-10-28 ENCOUNTER — Encounter (HOSPITAL_COMMUNITY)
Admission: RE | Admit: 2015-10-28 | Discharge: 2015-10-28 | Disposition: A | Discharge status: Home / Self Care | Payer: Medicare HMO | Source: Ambulatory Visit | Attending: Family Medicine | Admitting: Family Medicine

## 2015-10-28 ENCOUNTER — Other Ambulatory Visit: Payer: Self-pay | Admitting: Family Medicine

## 2015-10-28 DIAGNOSIS — R1011 Right upper quadrant pain: Secondary | ICD-10-CM | POA: Diagnosis present

## 2015-10-28 MED ORDER — TECHNETIUM TC 99M MEBROFENIN IV KIT
5.1000 | PACK | Freq: Once | INTRAVENOUS | Status: AC | PRN
  Administered 2015-10-28: 5 via INTRAVENOUS

## 2015-10-28 NOTE — Telephone Encounter (Addendum)
Spoke w/ pt, she states that she had no issues after receiving the IV medication during her HIDA scan, but then they made her drink a "milkshake" and that made her feel nauseous. She has had nausea from these types of shakes in the past, and was seen by [name] 10/15/15 for nausea/abd pain. States, "I knew I shouldn't have drank it because I knew it would make me feel sick." She also reports she has not eaten anything today because she was NPO for procedure, and has been too nauseous since to eat. Encouraged pt to drink fluids as able and to try to eat something light/bland such as plain crackers, toast, or soup. She has been drinking water and stated she might try some ginger ale. Informed pt that results from her study will be available once [name] returns to office and has a chance to review. Encouraged her to call back if symptoms worsen or persist w/ management as listed above.

## 2015-10-28 NOTE — Telephone Encounter (Signed)
noted 

## 2015-10-28 NOTE — Telephone Encounter (Signed)
Can be reached: [phone]   Reason for call: Pt had HIDA test today. She completed the test but she has been nauseous since. She is requesting nurse call.

## 2015-10-30 ENCOUNTER — Ambulatory Visit: Payer: Self-pay | Admitting: Internal Medicine

## 2015-10-30 NOTE — Telephone Encounter (Signed)
Spoke with the patient and she said she feels a knot in her abdomen and when she moves it feels like something grabs her and it is on both sides. She is wanting to go to the next step with the MRI, she said the pain is there and her history of cancer has her concerned. She will need something for anxiety when she gets the MRI, she said she think she is becoming immune to the oain med's and she does not know what to do.  Please advise     KP

## 2015-10-30 NOTE — Telephone Encounter (Signed)
Can be reached: [phone]  Pt requesting call from [facility] regarding same issues with nausea

## 2015-10-31 ENCOUNTER — Other Ambulatory Visit: Payer: Self-pay | Admitting: Family Medicine

## 2015-10-31 DIAGNOSIS — R1011 Right upper quadrant pain: Secondary | ICD-10-CM

## 2015-10-31 NOTE — Telephone Encounter (Signed)
Order for mri put in-----  Xanax 0.5 mg 1 po 30 min prior to procedure and pt should take bottle with her to [facility]

## 2015-10-31 NOTE — Telephone Encounter (Signed)
Mri ordered Xanax 0.5 mg 1 po 30 min prior to procedure #10 and take bottle with her to MRI

## 2015-11-02 ENCOUNTER — Other Ambulatory Visit: Payer: Self-pay | Admitting: Family Medicine

## 2015-11-02 DIAGNOSIS — R935 Abnormal findings on diagnostic imaging of other abdominal regions, including retroperitoneum: Secondary | ICD-10-CM

## 2015-11-02 MED ORDER — ALPRAZOLAM 0.5 MG PO TABS: ORAL_TABLET | ORAL | Status: DC

## 2015-11-02 NOTE — Addendum Note (Signed)
Addended by: Ewing Schlein on: 11/02/2015 09:02 AM   Modules accepted: Orders

## 2015-11-03 NOTE — Telephone Encounter (Signed)
Informed patient that order has been placed for MRI and medication sent to pharmacy for her to take 30 minutes prior to procedure. Advised she will receive call with appointment time and date

## 2015-11-05 ENCOUNTER — Other Ambulatory Visit: Payer: Self-pay | Admitting: Medical

## 2015-11-17 ENCOUNTER — Other Ambulatory Visit: Payer: Self-pay

## 2015-11-17 DIAGNOSIS — R1011 Right upper quadrant pain: Secondary | ICD-10-CM

## 2015-11-17 DIAGNOSIS — D509 Iron deficiency anemia, unspecified: Secondary | ICD-10-CM

## 2015-11-17 MED ORDER — PANTOPRAZOLE SODIUM 40 MG PO TBEC: 40.0000 mg | DELAYED_RELEASE_TABLET | Freq: Every day | ORAL | Status: DC

## 2015-11-18 ENCOUNTER — Telehealth: Payer: Self-pay | Admitting: Family Medicine

## 2015-11-18 NOTE — Telephone Encounter (Signed)
Caller name:Marci Relationship to patient:self Can be reached:[phone] Pharmacy:[facility]   Reason for call:patient has pain in her left shoulder and would like an x ray

## 2015-11-18 NOTE — Telephone Encounter (Signed)
Scheduled appointment for Monday with [name]. Patient declined earlier appointment because of transportation.

## 2015-11-18 NOTE — Telephone Encounter (Signed)
Dr.Lowne is out of the office and the patient would need to be seen for an evaluation.     KP

## 2015-11-21 ENCOUNTER — Ambulatory Visit (HOSPITAL_BASED_OUTPATIENT_CLINIC_OR_DEPARTMENT_OTHER)
Admission: RE | Admit: 2015-11-21 | Discharge: 2015-11-21 | Disposition: A | Discharge status: Home / Self Care | Payer: Medicare HMO | Source: Ambulatory Visit | Attending: Family Medicine | Admitting: Family Medicine

## 2015-11-21 DIAGNOSIS — K8689 Other specified diseases of pancreas: Secondary | ICD-10-CM | POA: Insufficient documentation

## 2015-11-21 DIAGNOSIS — R935 Abnormal findings on diagnostic imaging of other abdominal regions, including retroperitoneum: Secondary | ICD-10-CM

## 2015-11-21 MED ORDER — GADOBENATE DIMEGLUMINE 529 MG/ML IV SOLN
10.0000 mL | Freq: Once | INTRAVENOUS | Status: AC | PRN
  Administered 2015-11-21: 10 mL via INTRAVENOUS

## 2015-11-23 ENCOUNTER — Other Ambulatory Visit: Payer: Self-pay | Admitting: Family Medicine

## 2015-11-23 ENCOUNTER — Encounter: Payer: Self-pay | Admitting: Medical

## 2015-11-23 ENCOUNTER — Telehealth: Payer: Self-pay | Admitting: Family Medicine

## 2015-11-23 ENCOUNTER — Ambulatory Visit (HOSPITAL_BASED_OUTPATIENT_CLINIC_OR_DEPARTMENT_OTHER)
Admission: RE | Admit: 2015-11-23 | Discharge: 2015-11-23 | Disposition: A | Discharge status: Home / Self Care | Payer: Medicare HMO | Source: Ambulatory Visit | Attending: Medical | Admitting: Medical

## 2015-11-23 ENCOUNTER — Encounter: Payer: Medicare HMO | Admitting: Medical

## 2015-11-23 ENCOUNTER — Ambulatory Visit (INDEPENDENT_AMBULATORY_CARE_PROVIDER_SITE_OTHER): Payer: Medicare HMO | Admitting: Medical

## 2015-11-23 VITALS — BP 118/76 | HR 96 | Temp 98.1°F | Ht 67.0 in | Wt 250.0 lb

## 2015-11-23 DIAGNOSIS — M25512 Pain in left shoulder: Secondary | ICD-10-CM | POA: Diagnosis not present

## 2015-11-23 DIAGNOSIS — M19012 Primary osteoarthritis, left shoulder: Secondary | ICD-10-CM | POA: Insufficient documentation

## 2015-11-23 DIAGNOSIS — M79661 Pain in right lower leg: Secondary | ICD-10-CM | POA: Diagnosis not present

## 2015-11-23 DIAGNOSIS — S46812A Strain of other muscles, fascia and tendons at shoulder and upper arm level, left arm, initial encounter: Secondary | ICD-10-CM | POA: Diagnosis not present

## 2015-11-23 DIAGNOSIS — R0781 Pleurodynia: Secondary | ICD-10-CM | POA: Diagnosis not present

## 2015-11-23 NOTE — Progress Notes (Signed)
This encounter was created in error - please disregard.

## 2015-11-23 NOTE — Progress Notes (Signed)
Subjective:    Patient ID: [name], female    DOB: March 13, 1951, 65 y.o.   MRN: [id number]  HPI  Pt in with some left shoulder pain. Pt states she was having some pain in left side of her neck posterior shoulder pain and left side of arm pain.She states this pain is new. Pain started when she moved her neck. Pt has limited abduction of left shoulder but this has been the case for years. She states history of shoulder surgery in the past. Pt described arthroscopic type surgery.   Pt fell saturday night getting out of bed. She landed on her left rib are. Then after fall she noticed rt calf pain.   Pain in shoulder before she fell.  When she walks her ribs hurt and when walks calf pain/pull sensation.  Pt uses percocet for hands and fee pain. She also is on cymbalta and skelexin. She see pain management.   Review of Systems  Constitutional: Negative for fever, chills and fatigue.  Respiratory: Negative for cough, shortness of breath and wheezing.   Cardiovascular: Negative for chest pain and palpitations.  Musculoskeletal: Negative for back pain and gait problem.       Lt shoulder pain.   Lt rib pain.  Skin: Positive for rash.  Neurological: Negative for dizziness, seizures, syncope, weakness, numbness and headaches.  Hematological: Negative for adenopathy. Does not bruise/bleed easily.  Psychiatric/Behavioral: Negative for behavioral problems and confusion.    Past Medical History  Diagnosis Date  . stomach ca dx'd 2010    chemo/xrt comp 05/2009  . Hypertension   . Diabetes mellitus   . Asthma   . Sleep apnea   . Degenerative joint disease   . Hyperlipidemia   . GERD (gastroesophageal reflux disease)   . Neuropathy ([place])     bilateral hands and feet     Social History   Social History  . Marital Status: Divorced    Spouse Name: N/A  . Number of Children: N/A  . Years of Education: N/A   Occupational History  . Not on file.   Social History Main Topics  .  Smoking status: Former Smoker -- 1.00 packs/day for 15 years    Types: Cigarettes    Start date: 09/27/1973    Quit date: 10/15/1988  . Smokeless tobacco: Never Used     Comment: quit smoking 14 years ago  . Alcohol Use: No  . Drug Use: No  . Sexual Activity: No   Other Topics Concern  . Not on file   Social History Narrative    Past Surgical History  Procedure Laterality Date  . Total knee arthroplasty      Left  . Total hip arthroplasty      Right  . Shoulder surgery      Left  . Appendectomy    . Cesarean section    . Hernia repair    . Colon surgery    . Esophagogastroduodenoscopy N/A 10/15/2012    Procedure: ESOPHAGOGASTRODUODENOSCOPY (EGD);  Surgeon: Lear Ng, MD;  Location: [facility];  Service: Endoscopy;  Laterality: N/A;    Family History  Problem Relation Age of Onset  . Cancer Mother   . Alzheimer's disease Father   . Diabetes Brother   . Diabetes Maternal Grandmother   . Cancer Maternal Grandfather     lung  . Suicidality Neg Hx   . Depression Neg Hx   . Dementia Neg Hx   . Anxiety disorder Neg Hx  Allergies  Allergen Reactions  . Adhesive [Tape] Other (See Comments)    Burn Skin  . Codeine Other (See Comments)    paranoid  . Zocor [Simvastatin - High Dose] Other (See Comments)    Muscle spasms  . Penicillins Rash    Current Outpatient Prescriptions on File Prior to Visit  Medication Sig Dispense Refill  . ALPRAZolam (XANAX) 0.5 MG tablet Take 1 tablet 30 minutes prior to your procedure and take the bottle with you to your MRI 10 tablet 0  . aspirin 81 MG tablet Take 81 mg by mouth at bedtime.    Marland Kitchen azelastine (ASTELIN) 0.1 % nasal spray Place 1 spray into both nostrils 2 (two) times daily. Use in each nostril as directed 30 mL 12  . carvedilol (COREG) 12.5 MG tablet Take 12.5 mg by mouth every morning.  1  . carvedilol (COREG) 12.5 MG tablet TAKE 1 TABLET BY MOUTH BY EVERY MORNING 90 tablet 0  . DULoxetine (CYMBALTA) 60 MG  capsule Take 1 capsule (60 mg total) by mouth 2 (two) times daily. 180 capsule 3  . gabapentin (NEURONTIN) 300 MG capsule Take 3 capsules (900 mg total) by mouth 3 (three) times daily. 270 capsule 5  . glimepiride (AMARYL) 4 MG tablet TAKE 1 TABLET BY MOUTH TWICE A DAY 180 tablet 1  . hydrochlorothiazide (HYDRODIURIL) 25 MG tablet TAKE 1 TABLET (25 MG TOTAL) BY MOUTH DAILY. 90 tablet 1  . hydrochlorothiazide (HYDRODIURIL) 25 MG tablet TAKE 1 TABLET (25 MG TOTAL) BY MOUTH DAILY. 90 tablet 0  . lidocaine-prilocaine (EMLA) cream Apply 1 application topically as needed. 30 g 5  . LORazepam (ATIVAN) 0.5 MG tablet Take 1 tablet (0.5 mg total) by mouth at bedtime as needed for anxiety. 30 tablet 0  . metaxalone (SKELAXIN) 800 MG tablet Take 1 tablet (800 mg total) by mouth 4 (four) times daily. (Patient taking differently: Take 800 mg by mouth 4 (four) times daily as needed. ) 30 tablet 0  . metFORMIN (GLUCOPHAGE) 850 MG tablet Take 1 tablet (850 mg total) by mouth 2 (two) times daily. Repeat labs are due now 180 tablet 0  . montelukast (SINGULAIR) 10 MG tablet Take 1 tablet (10 mg total) by mouth at bedtime. 90 tablet 3  . Multiple Vitamin (MULTIVITAMIN WITH MINERALS) TABS tablet Take 1 tablet by mouth daily.    Marland Kitchen oxyCODONE-acetaminophen (PERCOCET) 10-325 MG tablet Take 1 tablet by mouth 3 (three) times daily.     . pantoprazole (PROTONIX) 40 MG tablet Take 1 tablet (40 mg total) by mouth daily. 90 tablet 1   No current facility-administered medications on file prior to visit.    BP 118/76 mmHg  Pulse 96  Temp(Src) 98.1 F (36.7 C) (Oral)  Ht 5' 7"  (1.702 m)  Wt 250 lb (113.399 kg)  BMI 39.15 kg/m2  SpO2 97%       Objective:   Physical Exam  General Mental Status- Alert. General Appearance- Not in acute distress.   Neck- left side trapezius tenderness to palpation. No mid cpsine tenderness  Skin General: Color- Normal Color. Moisture- Normal Moisture.  Neck Carotid Arteries- Normal  color. Moisture- Normal Moisture. No carotid bruits. No JVD.  Chest and Lung Exam Auscultation: Breath Sounds:-Normal.  Cardiovascular Auscultation:Rythm- Regular, Rate and Rhythm. Murmurs & Other Heart Sounds:Auscultation of the heart reveals- No Murmurs.  Abdomen Inspection:-Inspeection Normal. Palpation/Percussion:Note:No mass. Palpation and Percussion of the abdomen reveal- Non Tender, Non Distended + BS, no rebound or guarding.  Neurologic Cranial Nerve exam:- CN III-XII intact(No nystagmus), symmetric smile. Strength:- 5/5 equal and symmetric strength both upper and lower extremities.  Lt shoulder- pain on abduction and limited abduction(for years). No crepitus.   Anterior thorax- on inspection of pt lt lower rib area lower rib region. Faint tiny small bruise present.   Rt lower ext- calf has mid faint tenderness to palpation. Calf is not swollen. No bruising. Negative homans sign. Calf is symmetric compared to left.        Assessment & Plan:  For your left shoulder pain and left trapezius pain will  recommend using your cymbalta, muscle relaxant and oxycodone. You could use low dose alleve for next 3 days or low dose ibuprofen to help with pain. But would limit for only 3 days due to your kidney function being low.  Will get xray of chest, and left ribs.  Your calf pain is likely from the strain of the muscle. But if you get any pain behind the knee, any increasing calf pain or any calf swelling will get a [place] of your calf. Please don't hesitate to call us if such were to occur.  Follow up in 5-7 days or as needed.  Loyce Klasen, Percell Miller, PA-C

## 2015-11-23 NOTE — Progress Notes (Signed)
Pre visit review using our clinic review tool, if applicable. No additional management support is needed unless otherwise documented below in the visit note. 

## 2015-11-23 NOTE — Telephone Encounter (Signed)
No charge per pcp since she rescheduled this afternoon.

## 2015-11-23 NOTE — Telephone Encounter (Signed)
Pt called in at [facility] stating that she need to cancel her appt for this morning. Pt wanted to schedule appt for this afternoon instead. Scheduled pt.    Should pt be charged for missed appt?

## 2015-11-23 NOTE — Patient Instructions (Addendum)
For your left shoulder pain and left trapezius pain will  recommend using your cymbalta, muscle relaxant and oxycodone. You could use low dose alleve for next 3 days or low dose ibuprofen to help with pain. But would limit for only 3 days due to your kidney function being low.   Will get xray of chest and left ribs.  Your calf pain is likely from the strain of the muscle. But if you get any pain behind the knee, any increasing calf pain or any calf swelling will get a [place] of your calf. Please don't hesitate to call us if such were to occur.  Follow up in 5-7 days or as needed.

## 2015-11-24 NOTE — Progress Notes (Signed)
Quick Note:  Pt has seen results on MyChart and message also sent for patient to call back if any questions. ______ 

## 2015-11-26 ENCOUNTER — Telehealth: Payer: Self-pay | Admitting: Family Medicine

## 2015-11-26 DIAGNOSIS — G894 Chronic pain syndrome: Secondary | ICD-10-CM

## 2015-11-26 NOTE — Telephone Encounter (Signed)
Can be reached: [phone]   Reason for call: Pt would like referral to new pain mgmt. She is with [facility] now but wants to change to [facility], [address], Ph# [phone], fax# [phone]

## 2015-11-26 NOTE — Telephone Encounter (Signed)
Ok per [employer]. New referral placed.    KP

## 2015-12-01 NOTE — Telephone Encounter (Signed)
Pt called in to follow up on referral. Informed pt of the below. Pt says that she was informed by pain management to have PCP request medical records otherwise it will take 30 days to received due to them being unable to fax.    CB: [phone]

## 2015-12-01 NOTE — Telephone Encounter (Signed)
She will have to complete a release form here in the office     KP

## 2015-12-02 IMAGING — US US CAROTID DUPLEX BILAT
1 series · 13 of 24 positions shown · non-contrast
Comparison: Brain MRI 08/09/2014; thyroid ultrasound 12/22/2009

CLINICAL DATA: 63-year-old female with prior cerebral vascular
accident and dizziness

EXAM:
BILATERAL CAROTID DUPLEX ULTRASOUND
TECHNIQUE: Gray scale imaging, color Doppler and duplex ultrasound were
performed of bilateral carotid and vertebral arteries in the neck.

[Series 1: us carotid duplex bilat · 0.07mm/px · 13 of 80 slices shown]
[im 1/80]
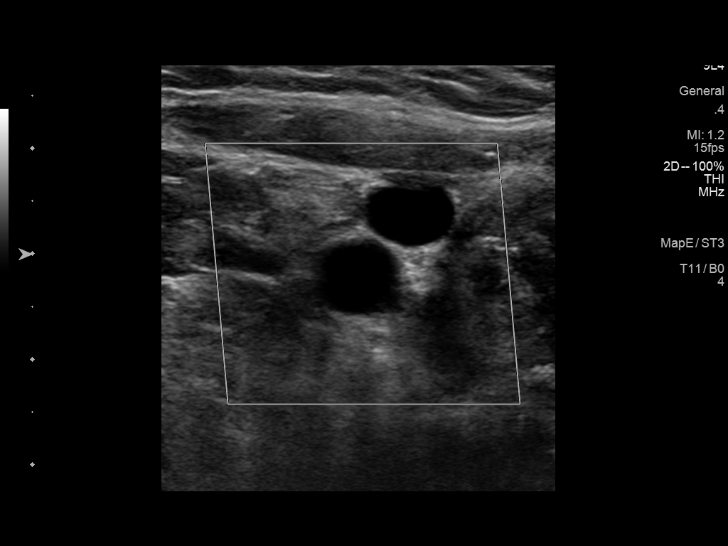
[im 7/80]
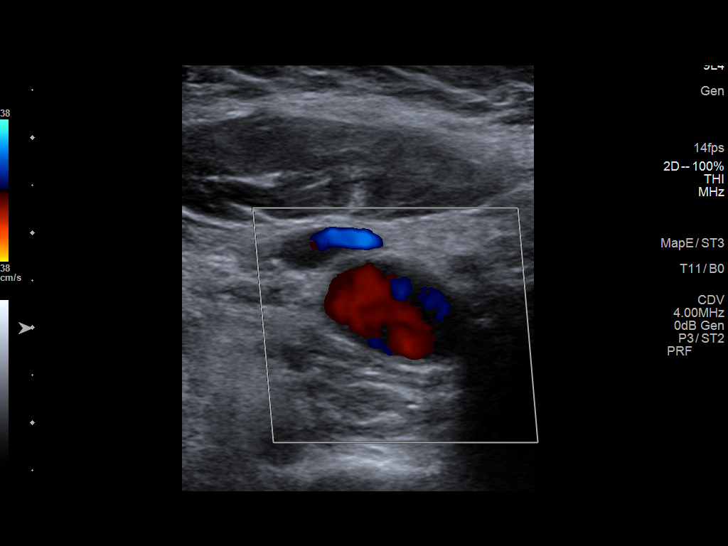
[im 14/80]
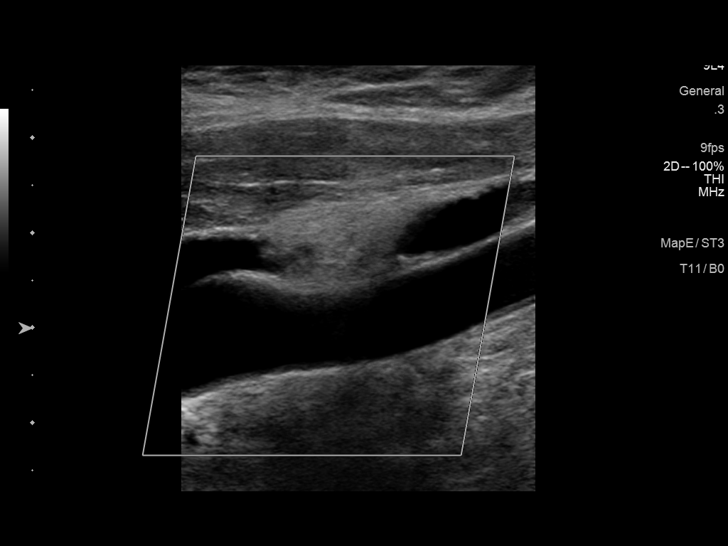
[im 21/80]
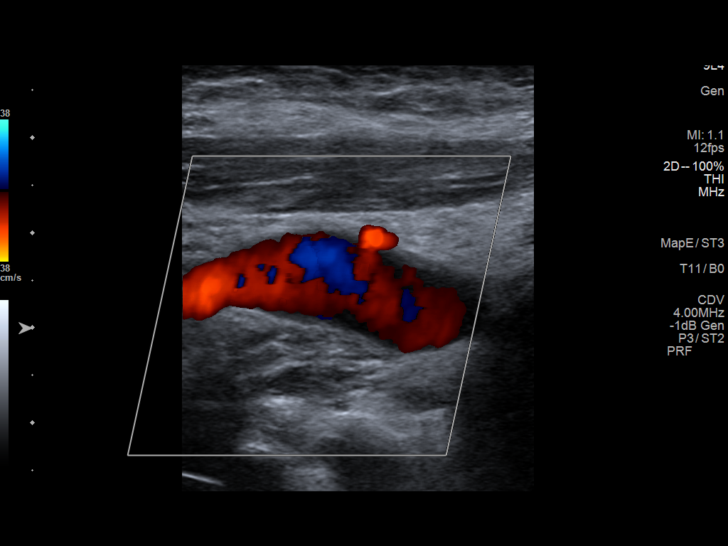
[im 28/80]
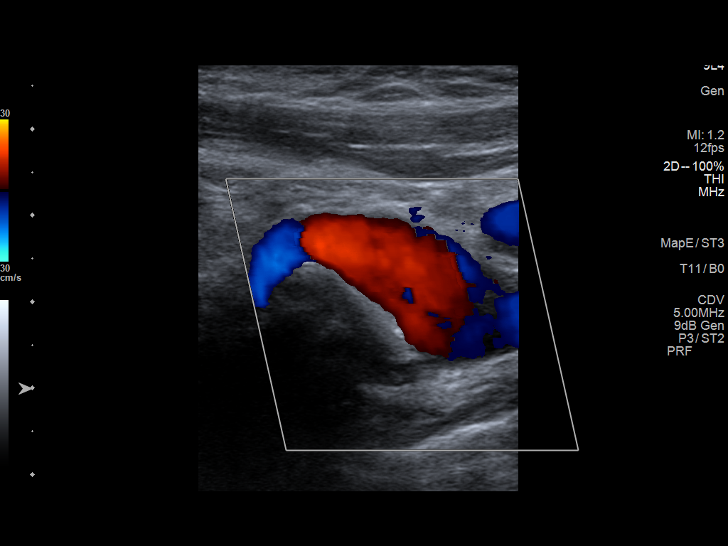
[im 35/80]
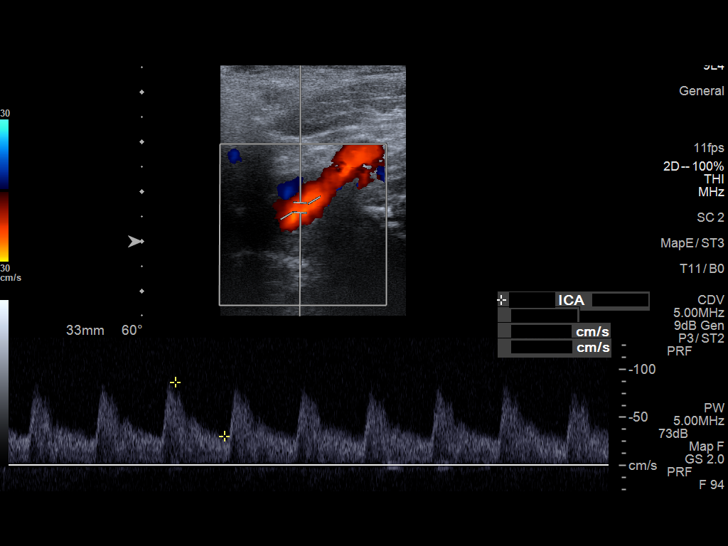
[im 42/80]
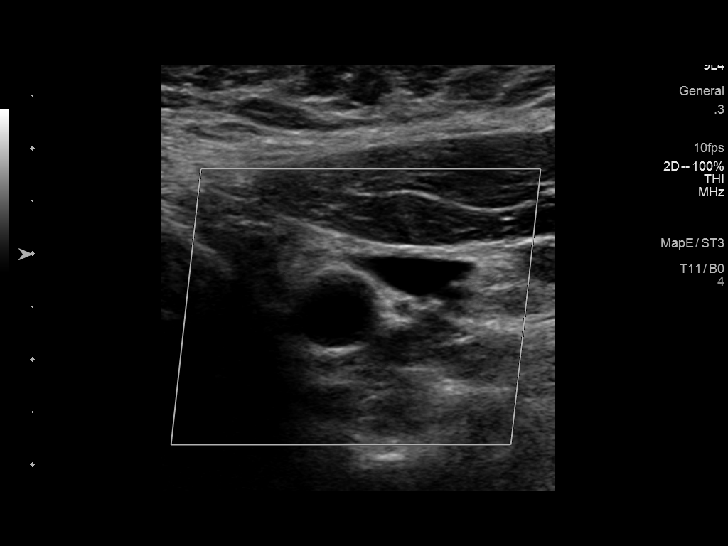
[im 45/80]
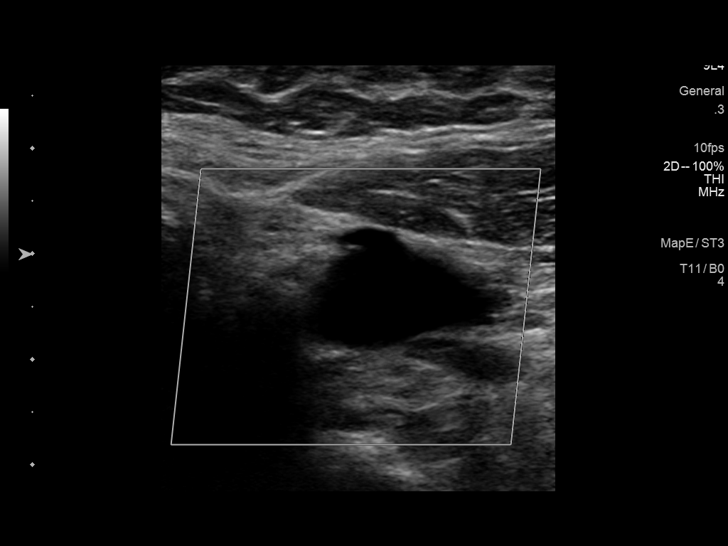
[im 52/80]
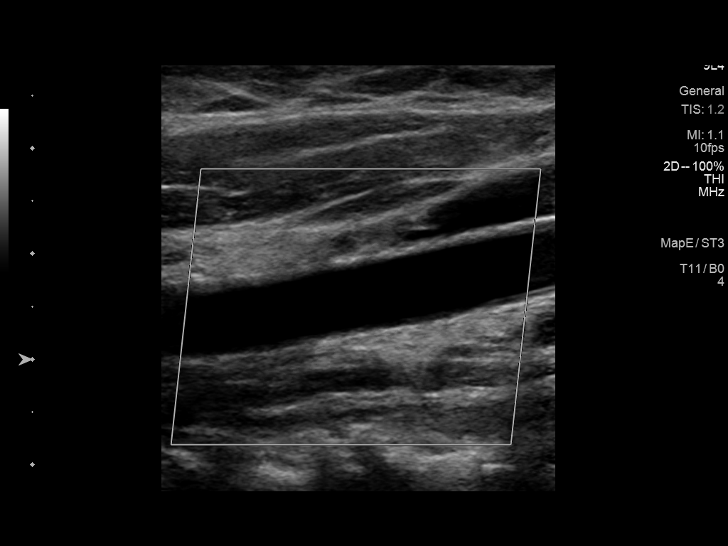
[im 59/80]
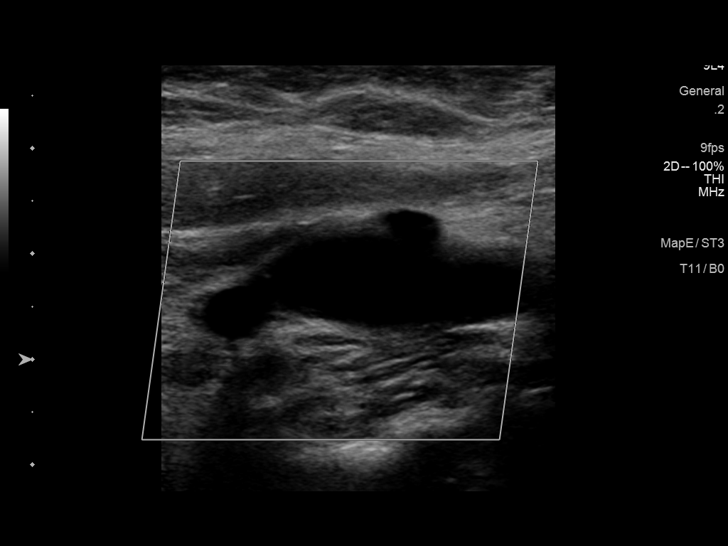
[im 66/80]
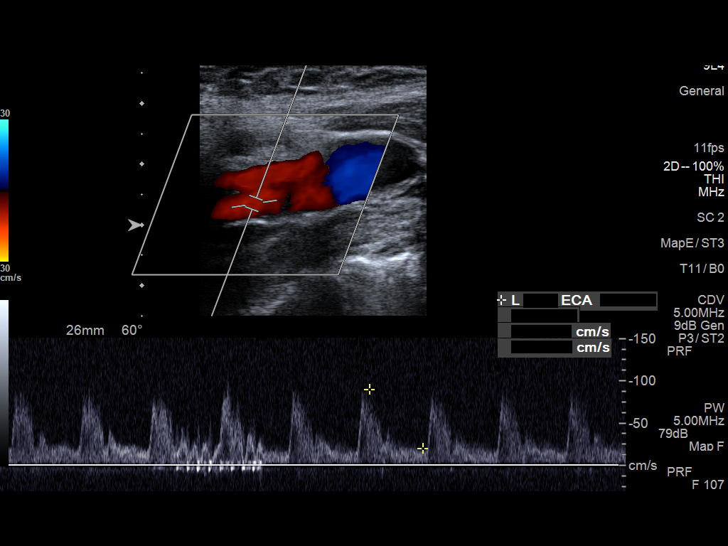
[im 73/80]
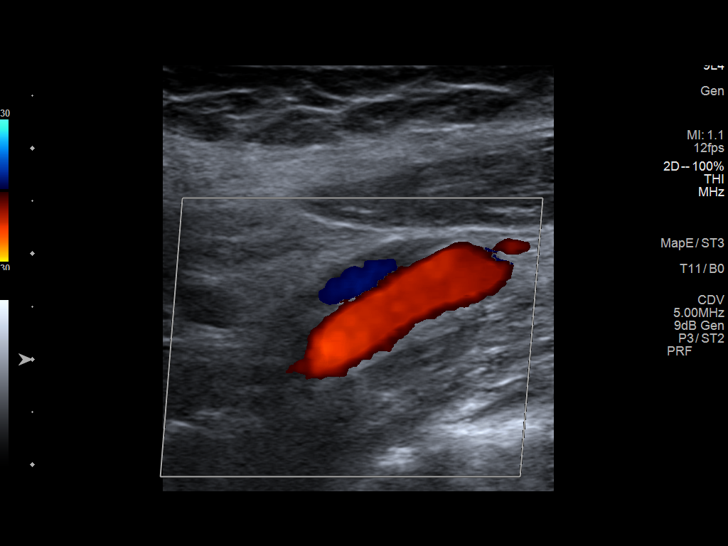
[im 80/80]
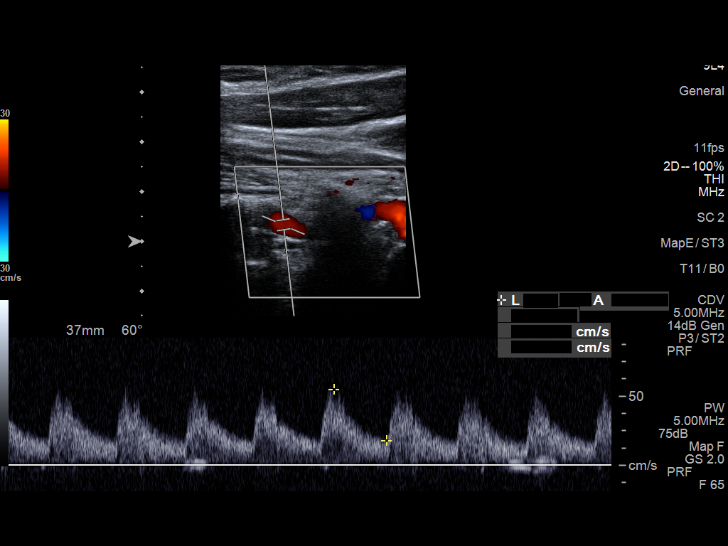

[13 of 24 positions shown; findings below may reference images not displayed]

FINDINGS: Criteria: Quantification of carotid stenosis is based on velocity
parameters that correlate the residual internal carotid diameter
with NASCET-based stenosis levels, using the diameter of the distal
internal carotid lumen as the denominator for stenosis measurement.

The following velocity measurements were obtained:

RIGHT

ICA:  87/30 cm/sec

CCA:  110/29 cm/sec

SYSTOLIC ICA/CCA RATIO:

DIASTOLIC ICA/CCA RATIO:

ECA:  81 cm/sec

LEFT

ICA:  87/18 cm/sec

CCA:  110/30 cm/sec

SYSTOLIC ICA/CCA RATIO:

DIASTOLIC ICA/CCA RATIO:

ECA:  90 cm/sec

RIGHT CAROTID ARTERY: No significant atherosclerotic plaque or
evidence of stenosis. The internal carotid artery is tortuous in the
mid segment.

RIGHT VERTEBRAL ARTERY:  Patent with normal antegrade flow.

LEFT CAROTID ARTERY: No significant atherosclerotic plaque or
evidence of stenosis. The mid segment is mildly tortuous.

LEFT VERTEBRAL ARTERY:  Patent with normal antegrade flow.

Incidental note is made of bilateral thyroid nodules.
IMPRESSION: 1. Negative for evidence of atherosclerotic plaque or stenosis.
2. Vertebral arteries remain patent with normal antegrade flow.
3. Mild internal carotid artery tortuosity bilaterally.
4. Incidental note is made of bilateral thyroid nodules. Recommend
correlation with the prior thyroid ultrasound performed 12/22/2009.

## 2015-12-18 ENCOUNTER — Telehealth: Payer: Self-pay | Admitting: Family Medicine

## 2015-12-18 NOTE — Telephone Encounter (Signed)
She needs to call them and let them know.     KP

## 2015-12-18 NOTE — Telephone Encounter (Signed)
Caller name: Self  Can be reached: [phone]   Pharmacy:  [id number] [place] [phone](Phone) [phone](Fax)       Reason for call: WMartin Majesticto the pain clinic today and her medication. Took her off Oxycodone and put her on Morphine. Pharmacy will not fill rx for Morphine unless it is PA. Saw Dr, MLegrand ComoRoche @ the pain clinic.

## 2015-12-18 NOTE — Telephone Encounter (Signed)
Informed patient of below. Patient understood

## 2015-12-22 ENCOUNTER — Telehealth: Payer: Self-pay | Admitting: Internal Medicine

## 2015-12-30 ENCOUNTER — Encounter: Payer: Self-pay | Admitting: Internal Medicine

## 2015-12-30 ENCOUNTER — Other Ambulatory Visit: Payer: Medicare HMO

## 2015-12-30 ENCOUNTER — Ambulatory Visit (INDEPENDENT_AMBULATORY_CARE_PROVIDER_SITE_OTHER): Payer: Medicare HMO | Admitting: Internal Medicine

## 2015-12-30 VITALS — BP 114/80 | Temp 97.9°F | Resp 14 | Wt 235.0 lb

## 2015-12-30 DIAGNOSIS — E1165 Type 2 diabetes mellitus with hyperglycemia: Secondary | ICD-10-CM | POA: Diagnosis not present

## 2015-12-30 DIAGNOSIS — IMO0002 Reserved for concepts with insufficient information to code with codable children: Secondary | ICD-10-CM

## 2015-12-30 DIAGNOSIS — E042 Nontoxic multinodular goiter: Secondary | ICD-10-CM

## 2015-12-30 DIAGNOSIS — E1142 Type 2 diabetes mellitus with diabetic polyneuropathy: Secondary | ICD-10-CM

## 2015-12-30 DIAGNOSIS — E1121 Type 2 diabetes mellitus with diabetic nephropathy: Secondary | ICD-10-CM | POA: Insufficient documentation

## 2015-12-30 LAB — HEMOGLOBIN A1C
HEMOGLOBIN A1C: 11.2 % — AB (ref <5.7)
MEAN PLASMA GLUCOSE: 275 mg/dL

## 2015-12-30 LAB — TSH: TSH: 2.15 u[IU]/mL (ref 0.35–4.50)

## 2015-12-30 LAB — T3, FREE: T3 FREE: 3.5 pg/mL (ref 2.3–4.2)

## 2015-12-30 LAB — T4, FREE: FREE T4: 1 ng/dL (ref 0.60–1.60)

## 2015-12-30 MED ORDER — METFORMIN HCL 850 MG PO TABS: 850.0000 mg | ORAL_TABLET | Freq: Two times a day (BID) | ORAL | Status: DC

## 2015-12-30 NOTE — Progress Notes (Signed)
Patient ID: [name], female   DOB: 1951-02-25, 65 y.o.   MRN: [id number]   HPI  [name] is a 65 y.o.-year-old female, initially referred by her PCP, Dr. Ralene Bathe, for evaluation and management of MNG. She also wants me to manage her DM2, non-insulin dependent. Last visit 1 year ago.  PCP: [name].  Pt has had a h/o MNG and had RAI tx for an overactive thyroid nodule in 2011.  Latest Thyroid U/S (09/28/2014): 6 larger nodules, 2 of them of 3 cm  I suggested a Bx of the dominant 3 nodules at last visit but she refused, in favor of just following the nodules by U/S and clinically.  I reviewed pt's thyroid tests: Lab Results  Component Value Date   TSH 2.435 07/31/2014   TSH 1.348 10/12/2012   TSH 2.559 03/11/2011   TSH 3.155 01/05/2011   TSH 1.823 09/24/2010   TSH 1.925 06/25/2010   11/24/2009: 1. Normal (25%) 24 hour radioiodine uptake by the thyroid gland. 2. Autonomous nodule superiorly in the right lobe with relative suppression of activity elsewhere in the gland. There is a possible cold nodule in the mid right lobe, corresponding with the dominant nodule seen on prior CT.  01/11/2010: RAI Tx for the R autonomous thyroid nodule  Pt denies feeling nodules in neck, but has hoarseness,no dysphagia/odynophagia, SOB with lying down.  Pt denies: - no heat intolerance/cold intolerance - tremors - palpitations - hyperdefecation/constipation - dry skin - hair loss  She c/o lack of appetite and weight loss, and fatigue.  Pt does not have a FH of thyroid ds. No FH of thyroid cancer. No h/o radiation tx to head or neck.  No seaweed or kelp, no recent contrast studies. No steroid use. No herbal supplements.   She also has a history of stomach cancer - sx 2012, DM2.   DM2 - I noticed that pt's HbA1c was high at last check and at the end of the appt she tells me that she would want me to also manage this...  Last HbA1c: Lab Results  Component Value Date   HGBA1C 10.9* 07/07/2015   HGBA1C 9.3* 04/06/2015   HGBA1C 8.6* 10/22/2014   She is on: - Amaryl 4 mg 2x a day - Metformin 850 mg 2x a day. She was out of Metformin x 1 mo.  She checks CBGs 1x a day - am: - 2h after b'fast: 120s - lunch: n/c - 2h after lunch: n/c - dinner: n/c - 2h after dinner: n/c - bedtime: n/c   ROS: Constitutional: + see HPI Eyes: no blurry vision, no xerophthalmia ENT: no sore throat, no nodules palpated in throat, no dysphagia/odynophagia, + hoarseness Cardiovascular: no CP/palpitations/+ leg swelling Respiratory: no cough/+ SOB Gastrointestinal: + N/no V/D/C Musculoskeletal: no muscle/joint aches Skin: no rashes Neurological: no tremors/numbness/tingling/dizziness  I reviewed pt's medications, allergies, PMH, social hx, family hx, and changes were documented in the history of present illness. Otherwise, unchanged from my initial visit note.  Past Medical History  Diagnosis Date  . stomach ca dx'd 2010    chemo/xrt comp 05/2009  . Hypertension   . Diabetes mellitus   . Asthma   . Sleep apnea   . Degenerative joint disease   . Hyperlipidemia   . GERD (gastroesophageal reflux disease)   . Neuropathy ([place])     bilateral hands and feet   Past Surgical History  Procedure Laterality Date  . Total knee arthroplasty      Left  .  Total hip arthroplasty      Right  . Shoulder surgery      Left  . Appendectomy    . Cesarean section    . Hernia repair    . Colon surgery    . Esophagogastroduodenoscopy N/A 10/15/2012    Procedure: ESOPHAGOGASTRODUODENOSCOPY (EGD);  Surgeon: Lear Ng, MD;  Location: [facility];  Service: Endoscopy;  Laterality: N/A;   History   Social History  . Marital Status: Divorced    Spouse Name: N/A  . Number of Children: N/A   Occupational History  . Not on file.   Social History Main Topics  . Smoking status: Former Smoker -- 1.00 packs/day for 15 years    Types: Cigarettes    Start date:  09/27/1973    Quit date: 10/15/1988  . Smokeless tobacco: Never Used     Comment: quit smoking 14 years ago  . Alcohol Use: No  . Drug Use: No   Current Outpatient Prescriptions on File Prior to Visit  Medication Sig Dispense Refill  . ALPRAZolam (XANAX) 0.5 MG tablet Take 1 tablet 30 minutes prior to your procedure and take the bottle with you to your MRI 10 tablet 0  . aspirin 81 MG tablet Take 81 mg by mouth at bedtime.    Marland Kitchen azelastine (ASTELIN) 0.1 % nasal spray Place 1 spray into both nostrils 2 (two) times daily. Use in each nostril as directed 30 mL 12  . carvedilol (COREG) 12.5 MG tablet Take 12.5 mg by mouth every morning.  1  . carvedilol (COREG) 12.5 MG tablet TAKE 1 TABLET BY MOUTH BY EVERY MORNING 90 tablet 0  . DULoxetine (CYMBALTA) 60 MG capsule Take 1 capsule (60 mg total) by mouth 2 (two) times daily. 180 capsule 3  . glimepiride (AMARYL) 4 MG tablet TAKE 1 TABLET BY MOUTH TWICE A DAY 180 tablet 1  . hydrochlorothiazide (HYDRODIURIL) 25 MG tablet TAKE 1 TABLET (25 MG TOTAL) BY MOUTH DAILY. 90 tablet 0  . lidocaine-prilocaine (EMLA) cream Apply 1 application topically as needed. 30 g 5  . LORazepam (ATIVAN) 0.5 MG tablet Take 1 tablet (0.5 mg total) by mouth at bedtime as needed for anxiety. 30 tablet 0  . metaxalone (SKELAXIN) 800 MG tablet Take 1 tablet (800 mg total) by mouth 4 (four) times daily. (Patient taking differently: Take 800 mg by mouth 4 (four) times daily as needed. ) 30 tablet 0  . metFORMIN (GLUCOPHAGE) 850 MG tablet Take 1 tablet (850 mg total) by mouth 2 (two) times daily. Repeat labs are due now 180 tablet 0  . montelukast (SINGULAIR) 10 MG tablet Take 1 tablet (10 mg total) by mouth at bedtime. 90 tablet 3  . Multiple Vitamin (MULTIVITAMIN WITH MINERALS) TABS tablet Take 1 tablet by mouth daily.    . pantoprazole (PROTONIX) 40 MG tablet Take 1 tablet (40 mg total) by mouth daily. 90 tablet 1   No current facility-administered medications on file prior to  visit.   Allergies  Allergen Reactions  . Adhesive [Tape] Other (See Comments)    Burn Skin  . Codeine Other (See Comments)    paranoid  . Zocor [Simvastatin - High Dose] Other (See Comments)    Muscle spasms  . Penicillins Rash   Family History  Problem Relation Age of Onset  . Cancer Mother   . Alzheimer's disease Father   . Diabetes Brother   . Diabetes Maternal Grandmother   . Cancer Maternal Grandfather  lung  . Suicidality Neg Hx   . Depression Neg Hx   . Dementia Neg Hx   . Anxiety disorder Neg Hx    PE: BP 114/80 mmHg  Temp(Src) 97.9 F (36.6 C) (Oral)  Resp 14  Wt 235 lb (106.595 kg) Wt Readings from Last 3 Encounters:  12/30/15 235 lb (106.595 kg)  11/23/15 250 lb (113.399 kg)  10/16/15 252 lb (114.306 kg)   Constitutional: overweight, in NAD Eyes: PERRLA, EOMI, no exophthalmos ENT: moist mucous membranes, no thyromegaly, no cervical lymphadenopathy Cardiovascular: RRR, No MRG Respiratory: CTA B Gastrointestinal: abdomen soft, NT, ND, BS+ Musculoskeletal: no deformities, strength intact in all 4;  Skin: moist, warm, no rashes Neurological: no tremor with outstretched hands, DTR normal in all 4  ASSESSMENT: 1. MNG - thyroid U/S (09/27/2014): Right thyroid lobe: 54 x 22 x 25 mm. Upper pole nodule measures 2 x 1 x 1.5 cm. This appears relatively inhomogeneous and solid. Midpole nodule measures 3.5 x 2.2 x 2.3 cm and shows numerous areas of cystic and solid components. It is also somewhat hypervascular. In the lower pole there is a 14 x 12 x 8 mm homogeneous echogenic nodule.  Left thyroid lobe: 62 x 17 x 21 mm. There is a 7 x 7 x 10 mm upper pole nodule which appears complex cystic. In the midpole there is a 3 x 1.5 x 2 cm nodule that appears heterogeneous and mostly solid. In the lower pole there is a 2 x 1.5 x 2 cm solid mildly heterogeneous nodule.  Isthmus Thickness: 3 mm. 7 mm nodule in the isthmus.  Lymphadenopathy - None  visualized.   2. DM2, insulin-independent, uncontrolled, with complications - PN  PLAN: 1. MNG  - I reviewed the images of her thyroid ultrasound. I again suggested a Bx of the 3 dominant nodules (the R middle, the L middle and L lower nodules - The R lower nodule is hyperechoic, with a lower risk of cancer, while the R upper nodule was originally hot, also with a lower risk of cancer) and she agrees with this now. Will need a new U/S first. - we discussed about possible outcomes of the Bx and tx plan for each situation - will also check the TFTs as last ones were last year  2. DM2, insulin-independent, uncontrolled - we did not have enough time to address this, but for now, I refilled her Metformin and advised her: Patient Instructions  Please continue: - Amaryl 4 mg 2x a day  Please restart: - Metformin 850 mg 2x a day  Please return in 1.5 months.  - will check a HbA1c today - in am, sugars are at goal - given a sugar log and advised to check sugars 2x a day, adding checks later in the day - Return in about 6 weeks (around 02/10/2016).  Lab on 12/30/2015  Component Date Value Ref Range Status  . Hgb A1c MFr Bld 12/30/2015 11.2* <5.7 % Final   Comment:   For someone without known diabetes, a hemoglobin A1c value of 6.5% or greater indicates that they may have diabetes and this should be confirmed with a follow-up test.   For someone with known diabetes, a value <7% indicates that their diabetes is well controlled and a value greater than or equal to 7% indicates suboptimal control. A1c targets should be individualized based on duration of diabetes, age, comorbid conditions, and other considerations.   Currently, no consensus exists for use of hemoglobin A1c for diagnosis of  diabetes for children.     . Mean Plasma Glucose 12/30/2015 275   Final  Office Visit on 12/30/2015  Component Date Value Ref Range Status  . Free T4 12/30/2015 1.00  0.60 - 1.60 ng/dL Final  .  T3, Free 12/30/2015 3.5  2.3 - 4.2 pg/mL Final  . TSH 12/30/2015 2.15  0.35 - 4.50 uIU/mL Final   HbA1c very high >> will restart Metformin but will likely need intensification of tx at next visit. TFTs normal.

## 2015-12-30 NOTE — Patient Instructions (Addendum)
Please stop at the lab.  We will schedule the thyroid ultrasound and call you with the date and time.  Please continue: - Amaryl 4 mg 2x a day  Please restart: - Metformin 850 mg 2x a day  Please return in 1.5 months.

## 2016-01-01 ENCOUNTER — Ambulatory Visit: Payer: Medicare HMO | Admitting: Family Medicine

## 2016-01-04 ENCOUNTER — Ambulatory Visit: Payer: Self-pay | Admitting: Family Medicine

## 2016-01-04 ENCOUNTER — Ambulatory Visit (HOSPITAL_BASED_OUTPATIENT_CLINIC_OR_DEPARTMENT_OTHER)
Admission: RE | Admit: 2016-01-04 | Discharge: 2016-01-04 | Disposition: A | Discharge status: Home / Self Care | Payer: Medicare HMO | Source: Ambulatory Visit | Attending: Family Medicine | Admitting: Family Medicine

## 2016-01-04 ENCOUNTER — Encounter: Payer: Self-pay | Admitting: Family Medicine

## 2016-01-04 ENCOUNTER — Ambulatory Visit (INDEPENDENT_AMBULATORY_CARE_PROVIDER_SITE_OTHER): Payer: Medicare HMO | Admitting: Family Medicine

## 2016-01-04 ENCOUNTER — Ambulatory Visit: Payer: Medicare HMO | Admitting: Family Medicine

## 2016-01-04 VITALS — BP 118/74 | Temp 98.7°F | Resp 18 | Ht 67.0 in | Wt 233.4 lb

## 2016-01-04 DIAGNOSIS — H109 Unspecified conjunctivitis: Secondary | ICD-10-CM | POA: Diagnosis not present

## 2016-01-04 DIAGNOSIS — K760 Fatty (change of) liver, not elsewhere classified: Secondary | ICD-10-CM | POA: Insufficient documentation

## 2016-01-04 DIAGNOSIS — R112 Nausea with vomiting, unspecified: Secondary | ICD-10-CM

## 2016-01-04 DIAGNOSIS — R1012 Left upper quadrant pain: Secondary | ICD-10-CM

## 2016-01-04 DIAGNOSIS — K5790 Diverticulosis of intestine, part unspecified, without perforation or abscess without bleeding: Secondary | ICD-10-CM | POA: Insufficient documentation

## 2016-01-04 LAB — CBC WITH DIFFERENTIAL/PLATELET
BASOS ABS: 0 10*3/uL (ref 0.0–0.1)
Basophils Relative: 0.3 % (ref 0.0–3.0)
Eosinophils Absolute: 0.2 10*3/uL (ref 0.0–0.7)
Eosinophils Relative: 2.1 % (ref 0.0–5.0)
HCT: 43.3 % (ref 36.0–46.0)
Hemoglobin: 14.3 g/dL (ref 12.0–15.0)
LYMPHS ABS: 2.6 10*3/uL (ref 0.7–4.0)
Lymphocytes Relative: 33.1 % (ref 12.0–46.0)
MCHC: 33 g/dL (ref 30.0–36.0)
MCV: 89.3 fl (ref 78.0–100.0)
MONO ABS: 0.7 10*3/uL (ref 0.1–1.0)
Monocytes Relative: 9.3 % (ref 3.0–12.0)
NEUTROS PCT: 55.2 % (ref 43.0–77.0)
Neutro Abs: 4.3 10*3/uL (ref 1.4–7.7)
Platelets: 336 10*3/uL (ref 150.0–400.0)
RBC: 4.85 Mil/uL (ref 3.87–5.11)
RDW: 14.7 % (ref 11.5–15.5)
WBC: 7.7 10*3/uL (ref 4.0–10.5)

## 2016-01-04 LAB — COMPREHENSIVE METABOLIC PANEL
ALT: 54 U/L — ABNORMAL HIGH (ref 0–35)
AST: 55 U/L — AB (ref 0–37)
Albumin: 3.1 g/dL — ABNORMAL LOW (ref 3.5–5.2)
Alkaline Phosphatase: 90 U/L (ref 39–117)
BUN: 18 mg/dL (ref 6–23)
CHLORIDE: 98 meq/L (ref 96–112)
CO2: 29 mEq/L (ref 19–32)
CREATININE: 1.47 mg/dL — AB (ref 0.40–1.20)
Calcium: 9.1 mg/dL (ref 8.4–10.5)
GFR: 46.05 mL/min — ABNORMAL LOW (ref >60.00)
GLUCOSE: 298 mg/dL — AB (ref 70–99)
POTASSIUM: 4.3 meq/L (ref 3.5–5.1)
SODIUM: 138 meq/L (ref 135–145)
Total Bilirubin: 0.5 mg/dL (ref 0.2–1.2)
Total Protein: 6.8 g/dL (ref 6.0–8.3)

## 2016-01-04 LAB — AMYLASE: Amylase: 35 U/L (ref 27–131)

## 2016-01-04 LAB — POCT URINALYSIS DIPSTICK
Glucose, UA: NEGATIVE
Leukocytes, UA: NEGATIVE
Nitrite, UA: NEGATIVE
PH UA: 5.5
RBC UA: NEGATIVE
Spec Grav, UA: >=1.03
Urobilinogen, UA: 1

## 2016-01-04 LAB — LIPASE: Lipase: 3 U/L — ABNORMAL LOW (ref 11.0–59.0)

## 2016-01-04 LAB — POCT URINE PREGNANCY: PREG TEST UR: NEGATIVE

## 2016-01-04 LAB — H. PYLORI ANTIBODY, IGG: H PYLORI IGG: NEGATIVE

## 2016-01-04 MED ORDER — ONDANSETRON HCL 4 MG PO TABS: 4.0000 mg | ORAL_TABLET | Freq: Three times a day (TID) | ORAL | Status: DC | PRN

## 2016-01-04 MED ORDER — MOXIFLOXACIN HCL 0.5 % OP SOLN: 1.0000 [drp] | Freq: Three times a day (TID) | OPHTHALMIC | Status: DC

## 2016-01-04 MED ORDER — IOPAMIDOL (ISOVUE-300) INJECTION 61%
80.0000 mL | Freq: Once | INTRAVENOUS | Status: AC | PRN
  Administered 2016-01-04: 80 mL via INTRAVENOUS

## 2016-01-04 NOTE — Progress Notes (Signed)
Subjective:    Patient ID: [name], female    DOB: Dec 26, 1950, 65 y.o.   MRN: [id number]  HPI  Patient here for c/o abd pain , nausea, vomiting and constipation x few weeks.  Pt with a hx of gastric carcinoma.    Past Medical History  Diagnosis Date  . stomach ca dx'd 2010    chemo/xrt comp 05/2009  . Hypertension   . Diabetes mellitus   . Asthma   . Sleep apnea   . Degenerative joint disease   . Hyperlipidemia   . GERD (gastroesophageal reflux disease)   . Neuropathy ([place])     bilateral hands and feet    Review of Systems  Constitutional: Negative for diaphoresis, appetite change, fatigue and unexpected weight change.  Eyes: Negative for pain, redness and visual disturbance.  Respiratory: Negative for cough, chest tightness, shortness of breath and wheezing.   Cardiovascular: Negative for chest pain, palpitations and leg swelling.  Gastrointestinal: Positive for nausea, vomiting, abdominal pain and constipation. Negative for diarrhea.  Endocrine: Negative for cold intolerance, heat intolerance, polydipsia, polyphagia and polyuria.  Genitourinary: Negative for dysuria, frequency and difficulty urinating.  Neurological: Negative for dizziness, light-headedness, numbness and headaches.       Objective:    Physical Exam  Constitutional: She is oriented to person, place, and time. She appears well-developed and well-nourished.  HENT:  Head: Normocephalic and atraumatic.  Eyes: Conjunctivae and EOM are normal.  Neck: Normal range of motion. Neck supple. No JVD present. Carotid bruit is not present. No thyromegaly present.  Cardiovascular: Normal rate, regular rhythm and normal heart sounds.   No murmur heard. Pulmonary/Chest: Effort normal and breath sounds normal. No respiratory distress. She has no wheezes. She has no rales. She exhibits no tenderness.  Abdominal: Soft. Bowel sounds are normal. There is tenderness. There is guarding.  Musculoskeletal: She exhibits  no edema.  Neurological: She is alert and oriented to person, place, and time.  Psychiatric: She has a normal mood and affect.  Nursing note and vitals reviewed.   BP 118/74 mmHg  Temp(Src) 98.7 F (37.1 C) (Oral)  Resp 18  Ht 5' 7"  (1.702 m)  Wt 233 lb 6.4 oz (105.87 kg)  BMI 36.55 kg/m2 Wt Readings from Last 3 Encounters:  01/04/16 233 lb 6.4 oz (105.87 kg)  12/30/15 235 lb (106.595 kg)  11/23/15 250 lb (113.399 kg)     Lab Results  Component Value Date   WBC 7.7 01/04/2016   HGB 14.3 01/04/2016   HCT 43.3 01/04/2016   PLT 336.0 01/04/2016   GLUCOSE 298* 01/04/2016   CHOL 185 07/07/2015   TRIG 144 07/07/2015   HDL 58 07/07/2015   LDLCALC 98 07/07/2015   ALT 54* 01/04/2016   AST 55* 01/04/2016   NA 138 01/04/2016   K 4.3 01/04/2016   CL 98 01/04/2016   CREATININE 1.47* 01/04/2016   BUN 18 01/04/2016   CO2 29 01/04/2016   TSH 2.15 12/30/2015   INR 0.98 11/05/2010   HGBA1C 11.2* 12/30/2015   MICROALBUR 0.3 04/06/2015    Dg Ribs Unilateral W/chest Left  11/23/2015  CLINICAL DATA:  Fall yesterday with persistent left chest pain, initial encounter EXAM: LEFT RIBS AND CHEST - 3+ VIEW COMPARISON:  09/01/2015 FINDINGS: Cardiac shadow is stable. Right chest wall port is again seen. Lungs are well-aerated without focal infiltrate, effusion or pneumothorax. No definitive rib fracture is noted. IMPRESSION: No acute rib fracture. Electronically Signed   By: Inez Catalina  M.D.   On: 11/23/2015 16:33   Dg Shoulder Left  11/23/2015  CLINICAL DATA:  Initial encounter for Pt fell Sunday and is having left lateral rib pain and left shoulder pain EXAM: LEFT SHOULDER - 2+ VIEW COMPARISON:  None. FINDINGS: Visualized portion of the left hemithorax is normal. Osseous irregularity about the glenoid and medial aspect of the left humeral head is favored to be degenerative. There is also mild degenerative change about the acromioclavicular undersurface. No convincing evidence of acute fracture or  dislocation. 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 No convincing evidence of acute superimposed fracture. Electronically Signed   By: Abigail Miyamoto M.D.   On: 11/23/2015 16:32       Assessment & Plan:   Problem List Items Addressed This Visit    None    Visit Diagnoses    Abdominal pain, left upper quadrant    -  Primary    Relevant Orders    CBC with Differential/Platelet (Completed)    H. pylori antibody, IgG (Completed)    Amylase (Completed)    Lipase (Completed)    POCT urinalysis dipstick (Completed)    POCT urine pregnancy (Completed)    CT Abdomen Pelvis W Contrast (Completed)    Comprehensive metabolic panel (Completed)    Non-intractable vomiting with nausea, unspecified vomiting type        Relevant Medications    ondansetron (ZOFRAN) 4 MG tablet    Other Relevant Orders    POCT urinalysis dipstick (Completed)    POCT urine pregnancy (Completed)    Bilateral conjunctivitis        Relevant Medications    moxifloxacin (VIGAMOX) 0.5 % ophthalmic solution      Pt instructed to go to ER if pain worsens tonight Check labs  con't po fluids  Ann Held, DO

## 2016-01-04 NOTE — Patient Instructions (Signed)

## 2016-01-04 NOTE — Progress Notes (Signed)
Pre visit review using our clinic review tool, if applicable. No additional management support is needed unless otherwise documented below in the visit note. 

## 2016-01-05 ENCOUNTER — Ambulatory Visit: Payer: Self-pay | Admitting: Family Medicine

## 2016-01-05 ENCOUNTER — Telehealth: Payer: Self-pay | Admitting: Family Medicine

## 2016-01-05 NOTE — Telephone Encounter (Signed)
Message left to call the office.    KP 

## 2016-01-05 NOTE — Telephone Encounter (Signed)
Relation to PN:[id number] Call back [phone] Pharmacy:  Reason for call:  Patient inquiring abut imaging and U/S results. Please advise

## 2016-01-06 ENCOUNTER — Other Ambulatory Visit: Payer: Self-pay

## 2016-01-06 ENCOUNTER — Encounter: Payer: Self-pay | Admitting: Gastroenterology

## 2016-01-06 DIAGNOSIS — R1012 Left upper quadrant pain: Secondary | ICD-10-CM

## 2016-01-06 NOTE — Telephone Encounter (Signed)
Patient has been made aware of her results, and a referral has been placed for GI. She said she is still coughing up green stuff, no fever, no SOB, No wheezing, she said she was going out of town on Friday and wanted to get something called in. Please advise    KP

## 2016-01-06 NOTE — Telephone Encounter (Signed)
She did not c/o of coughing at ov z pack x 1 f/u next week if no better

## 2016-01-07 MED ORDER — AZITHROMYCIN 250 MG PO TABS: ORAL_TABLET | ORAL | Status: DC

## 2016-01-07 NOTE — Telephone Encounter (Signed)
Rx sent, notified pt.

## 2016-01-07 NOTE — Telephone Encounter (Signed)
error 

## 2016-01-09 IMAGING — US US SOFT TISSUE HEAD/NECK
1 series · 13 of 25 positions shown · non-contrast
Comparison: 12/22/2009, 01/11/2010, 08/20/2014

CLINICAL DATA: Initial evaluation for clinical thyromegaly common
carotid ultrasound performed July 2014 showing thyroid nodules,
personal history of gastric carcinoma

EXAM:
THYROID ULTRASOUND
TECHNIQUE: Ultrasound examination of the thyroid gland and adjacent soft
tissues was performed.

[Series 1: us soft tissue head/neck · 0.07mm/px · 77 acquisitions, 13 frames shown]
[im 1/77]
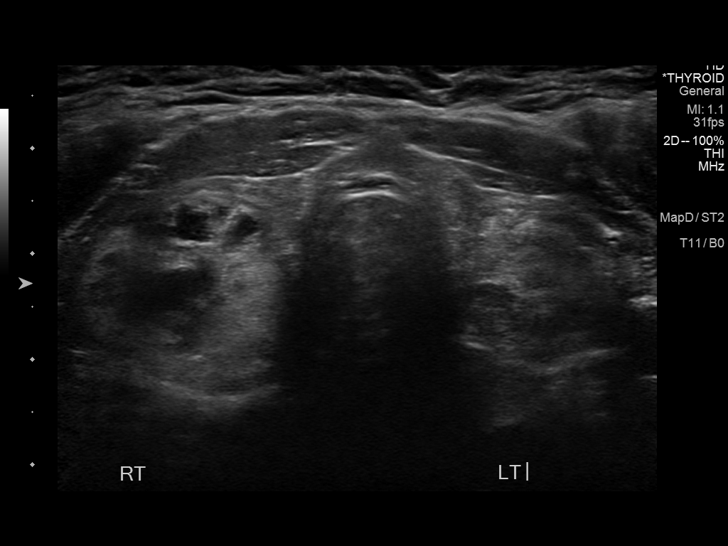
[im 7/77]
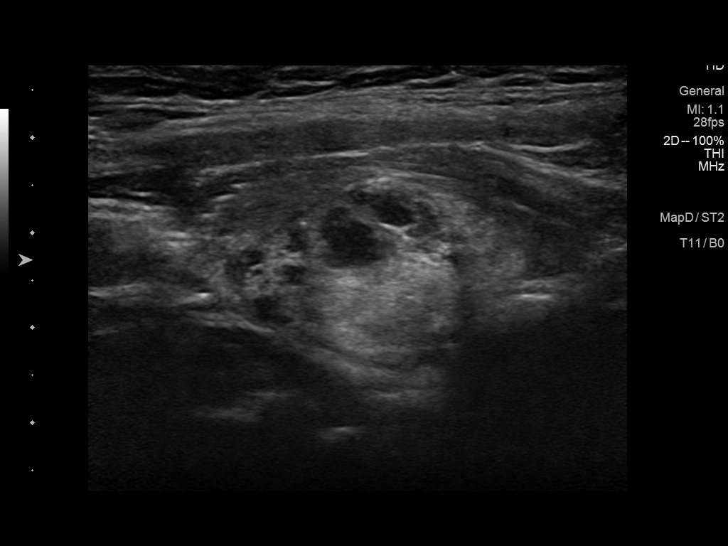
[im 13/77]
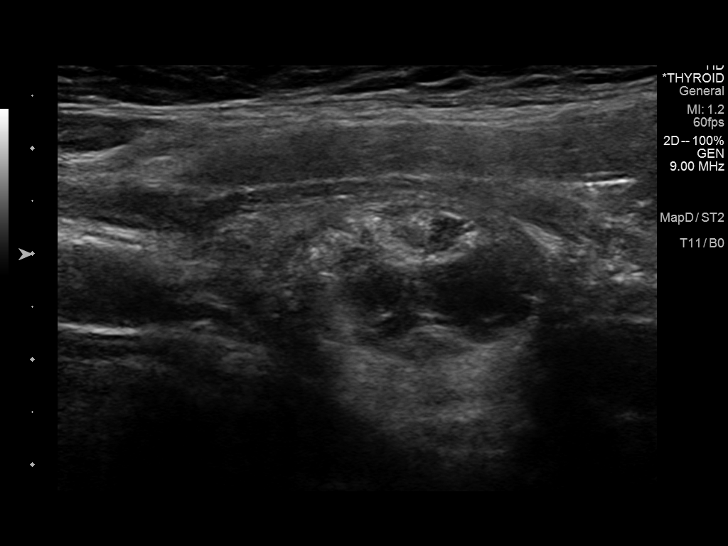
[im 20/77]
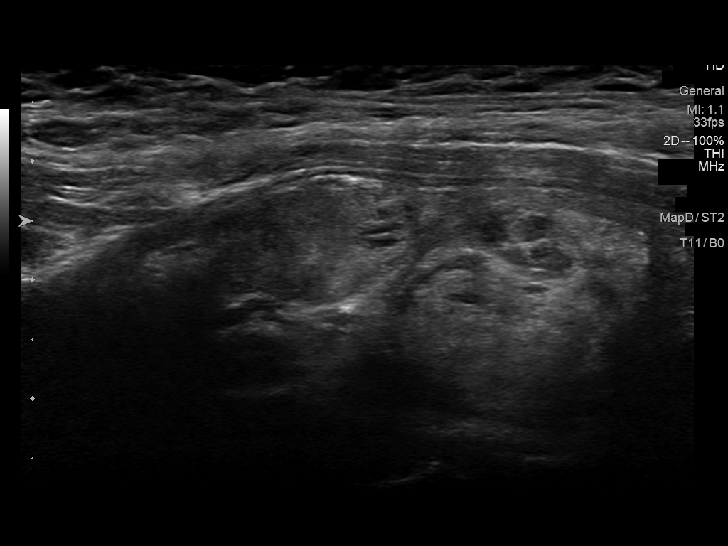
[im 26/77]
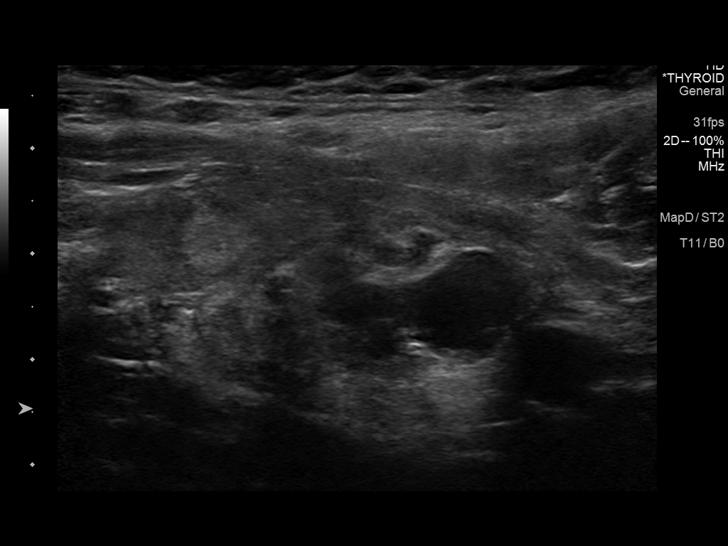
[im 32/77]
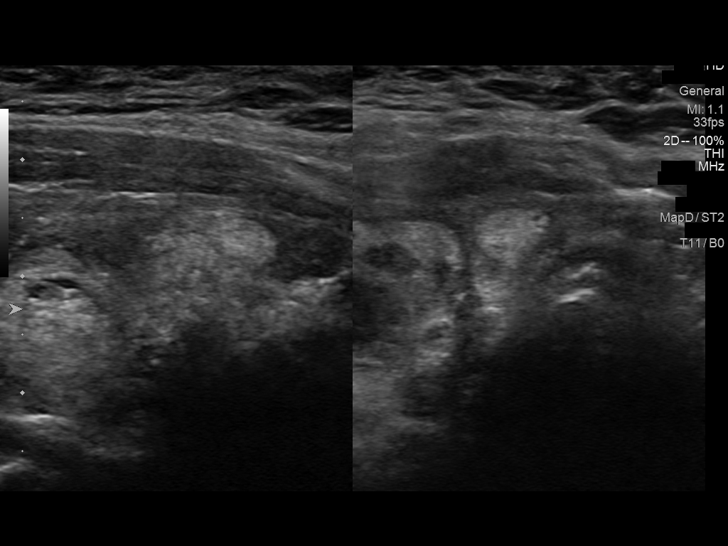
[im 39/77]
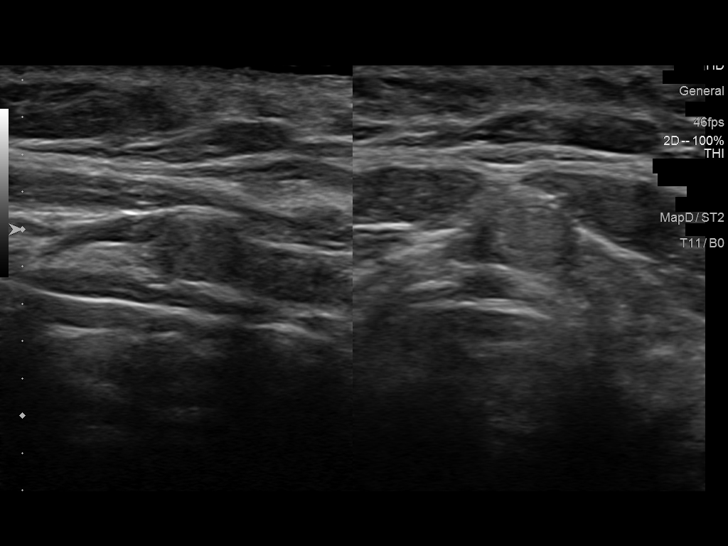
[im 45/77]
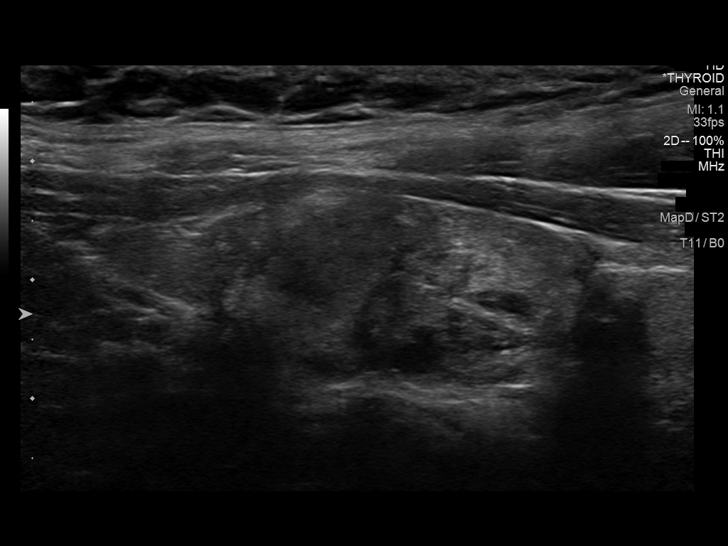
[im 51/77]
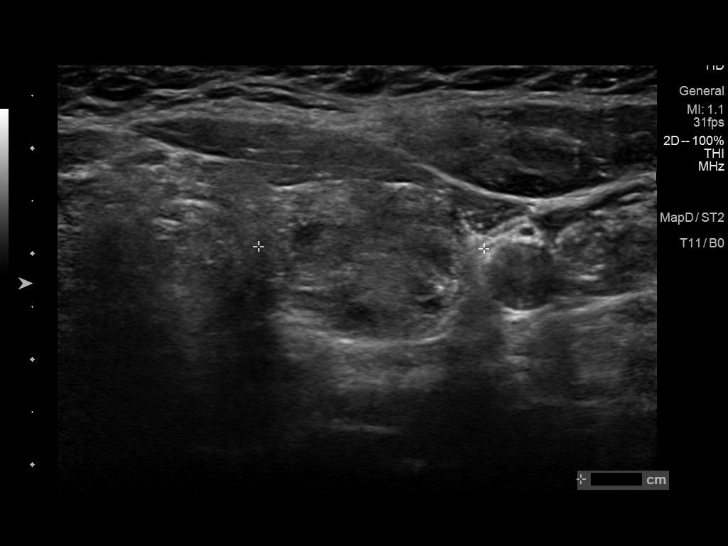
[im 58/77]
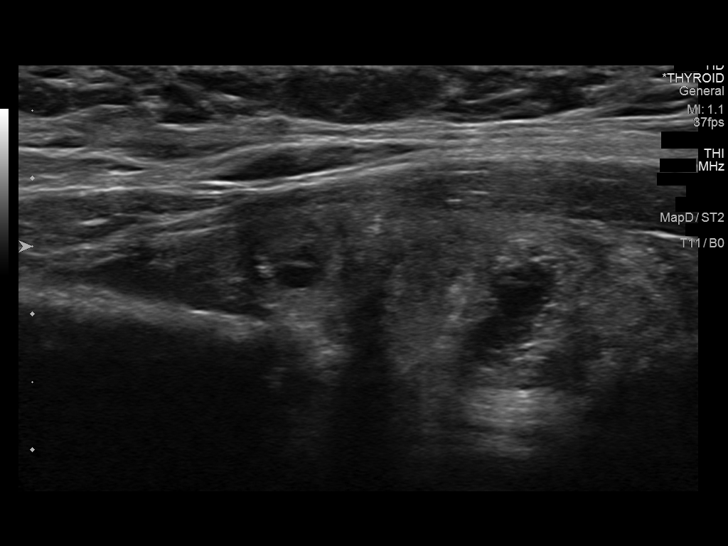
[im 64/77]
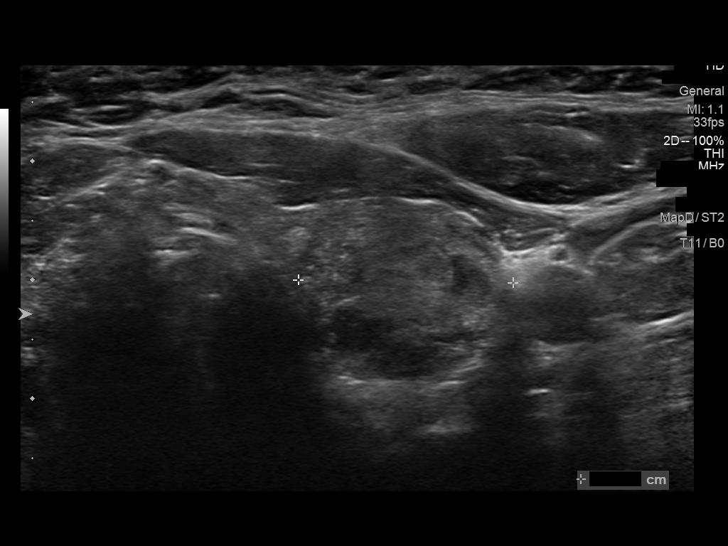
[im 70/77]
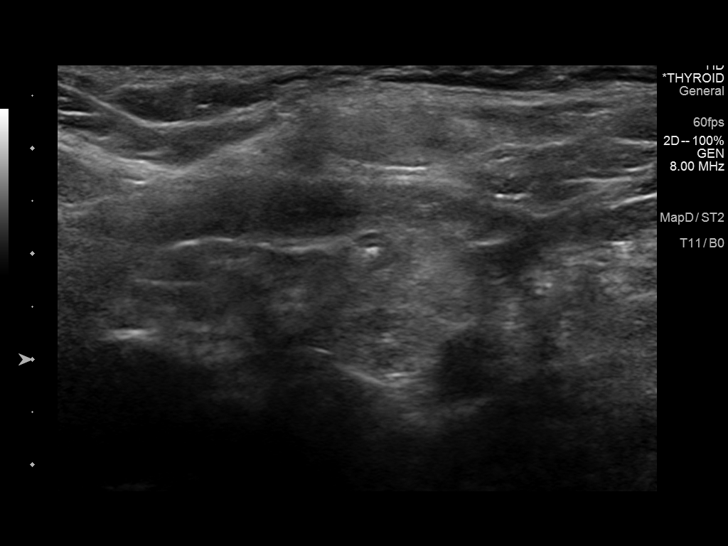
[im 77/77]
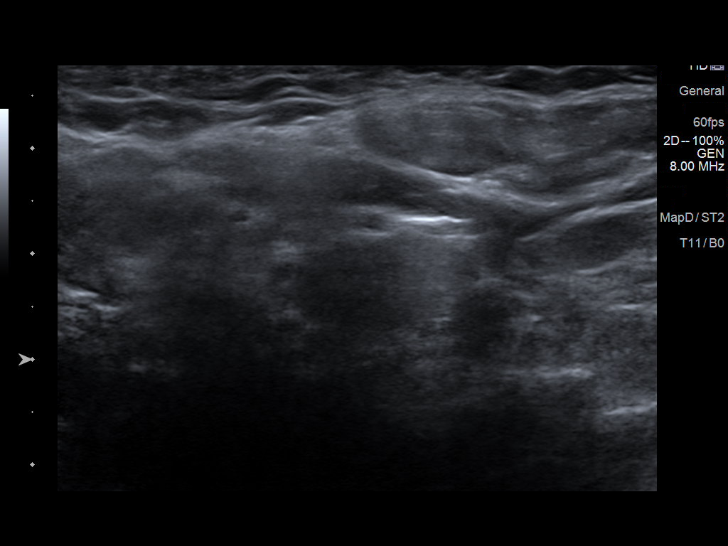

[13 of 25 positions shown; findings below may reference images not displayed]

FINDINGS: Right thyroid lobe

Measurements: 54 x 22 x 25 mm. Upper pole nodule measures 2 x 1 x
1.5 cm. This appears relatively inhomogeneous and solid. Midpole
nodule measures 3.5 x 2.2 x 2.3 cm and shows numerous areas of
cystic and solid components. It is also somewhat hypervascular. In
the lower pole there is a 14 x 12 x 8 mm homogeneous echogenic
nodule.

Left thyroid lobe

Measurements: 62 x 17 x 21 mm. There is a 7 x 7 x 10 mm upper pole
nodule which appears complex cystic. In the midpole there is a 3 x
1.5 x 2 cm nodule that appears heterogeneous and mostly solid. In
the lower pole there is a 2 x 1.5 x 2 cm solid mildly heterogeneous
nodule.

Isthmus

Thickness: 3 mm.  7 mm nodule in the isthmus.

Lymphadenopathy

None visualized.
IMPRESSION: Multinodular corridor. Numerous bilateral nodules meet sonographic
criteria for fine needle aspiration although it is noted that a
similar appearance was observed on prior ultrasound in [id number].

## 2016-01-12 ENCOUNTER — Emergency Department (HOSPITAL_BASED_OUTPATIENT_CLINIC_OR_DEPARTMENT_OTHER)
Admission: EM | Admit: 2016-01-12 | Discharge: 2016-01-13 | Disposition: A | Discharge status: Home / Self Care | Payer: Medicare HMO | Source: Home / Self Care | Attending: Emergency Medicine | Admitting: Emergency Medicine

## 2016-01-12 ENCOUNTER — Emergency Department (HOSPITAL_BASED_OUTPATIENT_CLINIC_OR_DEPARTMENT_OTHER): Payer: Medicare HMO

## 2016-01-12 ENCOUNTER — Encounter (HOSPITAL_BASED_OUTPATIENT_CLINIC_OR_DEPARTMENT_OTHER): Payer: Self-pay | Admitting: *Deleted

## 2016-01-12 DIAGNOSIS — Z85028 Personal history of other malignant neoplasm of stomach: Secondary | ICD-10-CM | POA: Insufficient documentation

## 2016-01-12 DIAGNOSIS — I10 Essential (primary) hypertension: Secondary | ICD-10-CM | POA: Diagnosis not present

## 2016-01-12 DIAGNOSIS — Z87891 Personal history of nicotine dependence: Secondary | ICD-10-CM | POA: Insufficient documentation

## 2016-01-12 DIAGNOSIS — E119 Type 2 diabetes mellitus without complications: Secondary | ICD-10-CM

## 2016-01-12 DIAGNOSIS — Z79899 Other long term (current) drug therapy: Secondary | ICD-10-CM | POA: Diagnosis not present

## 2016-01-12 DIAGNOSIS — R1084 Generalized abdominal pain: Secondary | ICD-10-CM

## 2016-01-12 DIAGNOSIS — J45909 Unspecified asthma, uncomplicated: Secondary | ICD-10-CM

## 2016-01-12 DIAGNOSIS — R1013 Epigastric pain: Secondary | ICD-10-CM | POA: Diagnosis not present

## 2016-01-12 DIAGNOSIS — Z7982 Long term (current) use of aspirin: Secondary | ICD-10-CM | POA: Insufficient documentation

## 2016-01-12 DIAGNOSIS — E785 Hyperlipidemia, unspecified: Secondary | ICD-10-CM | POA: Insufficient documentation

## 2016-01-12 DIAGNOSIS — Z7984 Long term (current) use of oral hypoglycemic drugs: Secondary | ICD-10-CM | POA: Insufficient documentation

## 2016-01-12 DIAGNOSIS — M1990 Unspecified osteoarthritis, unspecified site: Secondary | ICD-10-CM | POA: Diagnosis not present

## 2016-01-12 LAB — COMPREHENSIVE METABOLIC PANEL
ALT: 54 U/L (ref 14–54)
AST: 53 U/L — ABNORMAL HIGH (ref 15–41)
Albumin: 2.8 g/dL — ABNORMAL LOW (ref 3.5–5.0)
Alkaline Phosphatase: 93 U/L (ref 38–126)
Anion gap: 13 (ref 5–15)
BUN: 13 mg/dL (ref 6–20)
CHLORIDE: 100 mmol/L — AB (ref 101–111)
CO2: 26 mmol/L (ref 22–32)
CREATININE: 1.35 mg/dL — AB (ref 0.44–1.00)
Calcium: 8.7 mg/dL — ABNORMAL LOW (ref 8.9–10.3)
GFR calc non Af Amer: 41 mL/min — ABNORMAL LOW (ref >60)
GFR, EST AFRICAN AMERICAN: 47 mL/min — AB (ref >60)
Glucose, Bld: 199 mg/dL — ABNORMAL HIGH (ref 65–99)
Potassium: 3.9 mmol/L (ref 3.5–5.1)
SODIUM: 139 mmol/L (ref 135–145)
Total Bilirubin: 1 mg/dL (ref 0.3–1.2)
Total Protein: 6.8 g/dL (ref 6.5–8.1)

## 2016-01-12 LAB — LIPASE, BLOOD

## 2016-01-12 LAB — CBC
HEMATOCRIT: 39.9 % (ref 36.0–46.0)
HEMOGLOBIN: 14 g/dL (ref 12.0–15.0)
MCH: 29.6 pg (ref 26.0–34.0)
MCHC: 35.1 g/dL (ref 30.0–36.0)
MCV: 84.4 fL (ref 78.0–100.0)
PLATELETS: 361 10*3/uL (ref 150–400)
RBC: 4.73 MIL/uL (ref 3.87–5.11)
RDW: 14.7 % (ref 11.5–15.5)
WBC: 8.4 10*3/uL (ref 4.0–10.5)

## 2016-01-12 LAB — OCCULT BLOOD X 1 CARD TO LAB, STOOL: Fecal Occult Bld: NEGATIVE

## 2016-01-12 MED ORDER — SODIUM CHLORIDE 0.9 % IV BOLUS (SEPSIS)
1000.0000 mL | Freq: Once | INTRAVENOUS | Status: AC
  Administered 2016-01-12: 1000 mL via INTRAVENOUS

## 2016-01-12 MED ORDER — HYDROMORPHONE HCL 1 MG/ML IJ SOLN
1.0000 mg | Freq: Once | INTRAMUSCULAR | Status: AC
  Administered 2016-01-12: 1 mg via INTRAVENOUS
  Filled 2016-01-12: qty 1

## 2016-01-12 MED ORDER — ONDANSETRON HCL 4 MG/2ML IJ SOLN
4.0000 mg | Freq: Once | INTRAMUSCULAR | Status: AC | PRN
  Administered 2016-01-12: 4 mg via INTRAVENOUS
  Filled 2016-01-12: qty 2

## 2016-01-12 NOTE — ED Notes (Signed)
Abdominal pain since last Thursday. She was seen for the pain and had a negative exam. Diarrhea. No vomiting.

## 2016-01-12 NOTE — ED Provider Notes (Signed)
TIME SEEN: 11:00 PM  CHIEF COMPLAINT: abd pain, nausea  HPI: Pt is a 65 y.o. F with h/o gastric cancer in 2010 s/p chemo, radiation, ?partial gastrectomy who presents to the emergency department with abdominal pain. History is very limited as patient is a very poor historian. She initially states the pain has been present since last Thursday but it appears she had a CT scan with her primary care physician on June 12. States that this pain is the same and has not worsened. Unable to describe the pain, states "it just hurts". No aggravating or relieving factors. Has had nausea. No vomiting or diarrhea. Has had chills. No fever. Reports black and tarry stools. No bloody stool. No dysuria, hematuria, vaginal bleeding or discharge. No sick contacts or recent travel. PCP is [name].  States her PCP recommended she follow-up with her gastroenterologist Dr. Ardis Hughs. She has an appointment in August.  ROS: See HPI Constitutional: no fever  Eyes: no drainage  ENT: no runny nose   Cardiovascular:  no chest pain  Resp: no SOB  GI: no vomiting GU: no dysuria Integumentary: no rash  Allergy: no hives  Musculoskeletal: no leg swelling  Neurological: no slurred speech ROS otherwise negative  PAST MEDICAL HISTORY/PAST SURGICAL HISTORY:  Past Medical History  Diagnosis Date  . stomach ca dx'd 2010    chemo/xrt comp 05/2009  . Hypertension   . Diabetes mellitus   . Asthma   . Sleep apnea   . Degenerative joint disease   . Hyperlipidemia   . GERD (gastroesophageal reflux disease)   . Neuropathy ([place])     bilateral hands and feet    MEDICATIONS:  Prior to Admission medications   Medication Sig Start Date End Date Taking? Authorizing Provider  ALPRAZolam Duanne Moron) 0.5 MG tablet Take 1 tablet 30 minutes prior to your procedure and take the bottle with you to your MRI 11/02/15   Rosalita Chessman Chase, DO  aspirin 81 MG tablet Take 81 mg by mouth at bedtime.    Historical Provider, MD  azelastine  (ASTELIN) 0.1 % nasal spray Place 1 spray into both nostrils 2 (two) times daily. Use in each nostril as directed 01/13/15   Rosalita Chessman Chase, DO  azithromycin (ZITHROMAX) 250 MG tablet Take per package instructions. 01/07/16   Rosalita Chessman Chase, DO  carvedilol (COREG) 12.5 MG tablet Take 12.5 mg by mouth every morning. 07/20/15   Historical Provider, MD  carvedilol (COREG) 12.5 MG tablet TAKE 1 TABLET BY MOUTH BY EVERY MORNING 10/16/15   Alferd Apa Lowne Chase, DO  DULoxetine (CYMBALTA) 60 MG capsule Take 1 capsule (60 mg total) by mouth 2 (two) times daily. 06/09/15   Yvonne R Lowne Chase, DO  EMBEDA 30-1.2 MG CPCR TK ONE C PO ONCE D 12/24/15   Historical Provider, MD  Gabapentin Enacarbil (HORIZANT) 600 MG TBCR Take 1 tablet by mouth 2 (two) times daily.    Historical Provider, MD  glimepiride (AMARYL) 4 MG tablet TAKE 1 TABLET BY MOUTH TWICE A DAY 09/17/15   Yvonne R Lowne Chase, DO  hydrochlorothiazide (HYDRODIURIL) 25 MG tablet TAKE 1 TABLET (25 MG TOTAL) BY MOUTH DAILY. 10/16/15   Rosalita Chessman Chase, DO  lidocaine-prilocaine (EMLA) cream Apply 1 application topically as needed. 06/29/15   Volanda Napoleon, MD  LORazepam (ATIVAN) 0.5 MG tablet Take 1 tablet (0.5 mg total) by mouth at bedtime as needed for anxiety. 08/21/15   [place], DO  metaxalone (  SKELAXIN) 800 MG tablet Take 1 tablet (800 mg total) by mouth 4 (four) times daily. Patient taking differently: Take 800 mg by mouth 4 (four) times daily as needed.  04/06/15   Rosalita Chessman Chase, DO  metFORMIN (GLUCOPHAGE) 850 MG tablet Take 1 tablet (850 mg total) by mouth 2 (two) times daily. Repeat labs are due now 12/30/15   Philemon Kingdom, MD  montelukast (SINGULAIR) 10 MG tablet Take 1 tablet (10 mg total) by mouth at bedtime. 04/29/15   Rosalita Chessman Chase, DO  morphine (MSIR) 15 MG tablet TK 1 T PO TID PRN 12/18/15   Historical Provider, MD  moxifloxacin (VIGAMOX) 0.5 % ophthalmic solution Place 1 drop into both eyes 3 (three)  times daily. 01/04/16   Rosalita Chessman Chase, DO  Multiple Vitamin (MULTIVITAMIN WITH MINERALS) TABS tablet Take 1 tablet by mouth daily.    Historical Provider, MD  ondansetron (ZOFRAN) 4 MG tablet Take 1 tablet (4 mg total) by mouth every 8 (eight) hours as needed for nausea or vomiting. 01/04/16   Rosalita Chessman Chase, DO  pantoprazole (PROTONIX) 40 MG tablet Take 1 tablet (40 mg total) by mouth daily. 11/17/15   Rosalita Chessman Chase, DO    ALLERGIES:  Allergies  Allergen Reactions  . Adhesive [Tape] Other (See Comments)    Burn Skin  . Codeine Other (See Comments)    paranoid  . Zocor [Simvastatin - High Dose] Other (See Comments)    Muscle spasms  . Penicillins Rash    SOCIAL HISTORY:  Social History  Substance Use Topics  . Smoking status: Former Smoker -- 1.00 packs/day for 15 years    Types: Cigarettes    Start date: 09/27/1973    Quit date: 10/15/1988  . Smokeless tobacco: Never Used     Comment: quit smoking 14 years ago  . Alcohol Use: No    FAMILY HISTORY: Family History  Problem Relation Age of Onset  . Cancer Mother   . Alzheimer's disease Father   . Diabetes Brother   . Diabetes Maternal Grandmother   . Cancer Maternal Grandfather     lung  . Suicidality Neg Hx   . Depression Neg Hx   . Dementia Neg Hx   . Anxiety disorder Neg Hx     EXAM: BP 106/65 mmHg  Pulse 107  Temp(Src) 99.3 F (37.4 C) (Oral)  Resp 20  Ht 5' 7"  (1.702 m)  Wt 233 lb (105.688 kg)  BMI 36.48 kg/m2  SpO2 100% CONSTITUTIONAL: Alert and oriented and responds appropriately to questions. Chronically ill-appearing, moaning in pain, poorly cooperative with exam and history HEAD: Normocephalic EYES: Conjunctivae clear, PERRL ENT: normal nose; no rhinorrhea; moist mucous membranes NECK: Supple, no meningismus, no LAD  CARD: RRR; S1 and S2 appreciated; no murmurs, no clicks, no rubs, no gallops RESP: Normal chest excursion without splinting or tachypnea; breath sounds clear and equal  bilaterally; no wheezes, no rhonchi, no rales, no hypoxia or respiratory distress, speaking full sentences ABD/GI: Normal bowel sounds; non-distended; soft, tender to palpation diffusely throughout the abdomen, no rebound, no guarding, no peritoneal signs, large central vertical surgical scar RECTAL:  Normal rectal tone, no gross blood or melena, guaiac negative, no hemorrhoids appreciated, nontender rectal exam, no fecal impaction BACK:  The back appears normal and is non-tender to palpation, there is no CVA tenderness EXT: Normal ROM in all joints; non-tender to palpation; no edema; normal capillary refill; no cyanosis, no calf tenderness or swelling  SKIN: Normal color for age and race; warm; no rash NEURO: Moves all extremities equally, sensation to light touch intact diffusely, cranial nerves II through XII intact PSYCH: The patient's mood and manner are appropriate. Grooming and personal hygiene are appropriate.  MEDICAL DECISION MAKING: Patient here with abdominal pain. Has history of gastric cancer that was treated in 2010. She had a CT scan of her abdomen and pelvis on June 12. At that time showed a fatty liver, diverticulosis without diverticulitis. No other acute abnormality. States the pain feels the same and has not worsened at all. Exam and history very limited as patient is poorly cooperative and her daughters cannot add much information. We'll obtain labs, urine and an x-ray of her abdomen and pelvis. Given she has had the same pain for several days and reports is unchanged and had a recent negative CT scan, I would hate to CT her again to fill risk of radiation exposure outweighs any benefits. She is on chronic pain medication for her "hands". Discussed with her that this could be constipation causing some of her symptoms. She does report she had a slightly looser than normal bowel movement last night. She has been passing gas.  ED PROGRESS: Patient's labs are unremarkable other than  mild elevation of creatinine which appears to be baseline. She is Hemoccult negative. X-ray shows no bowel obstruction or other abnormality.  Urine shows no obvious sign of infection. She appears much more comfortable on exam. Have recommended close outpatient follow-up with her gastroenterologist. She is already on chronic pain medication which I have recommended she continue as prescribed. Do not feel she needs to be discharged with further narcotics. Discussed return cautions with patient and her daughter. She verbalized understanding and is comfortable with this plan.    At this time, I do not feel there is any life-threatening condition present. I have reviewed and discussed all results (EKG, imaging, lab, urine as appropriate), exam findings with patient. I have reviewed nursing notes and appropriate previous records.  I feel the patient is safe to be discharged home without further emergent workup. Discussed usual and customary return precautions. Patient and family (if present) verbalize understanding and are comfortable with this plan.  Patient will follow-up with their primary care provider. If they do not have a primary care provider, information for follow-up has been provided to them. All questions have been answered.    [place], DO 01/13/16 0201

## 2016-01-13 ENCOUNTER — Emergency Department (HOSPITAL_COMMUNITY): Payer: Medicare HMO

## 2016-01-13 ENCOUNTER — Telehealth: Payer: Self-pay | Admitting: Family Medicine

## 2016-01-13 ENCOUNTER — Telehealth: Payer: Self-pay | Admitting: *Deleted

## 2016-01-13 ENCOUNTER — Ambulatory Visit: Payer: Self-pay | Admitting: Family Medicine

## 2016-01-13 ENCOUNTER — Emergency Department (HOSPITAL_COMMUNITY)
Admission: EM | Admit: 2016-01-13 | Discharge: 2016-01-13 | Disposition: A | Discharge status: Home / Self Care | Payer: Medicare HMO | Attending: Emergency Medicine | Admitting: Emergency Medicine

## 2016-01-13 ENCOUNTER — Encounter (HOSPITAL_COMMUNITY): Payer: Self-pay | Admitting: Emergency Medicine

## 2016-01-13 DIAGNOSIS — M1990 Unspecified osteoarthritis, unspecified site: Secondary | ICD-10-CM | POA: Insufficient documentation

## 2016-01-13 DIAGNOSIS — Z79899 Other long term (current) drug therapy: Secondary | ICD-10-CM | POA: Insufficient documentation

## 2016-01-13 DIAGNOSIS — Z7984 Long term (current) use of oral hypoglycemic drugs: Secondary | ICD-10-CM | POA: Insufficient documentation

## 2016-01-13 DIAGNOSIS — R1013 Epigastric pain: Secondary | ICD-10-CM | POA: Insufficient documentation

## 2016-01-13 DIAGNOSIS — J45909 Unspecified asthma, uncomplicated: Secondary | ICD-10-CM | POA: Insufficient documentation

## 2016-01-13 DIAGNOSIS — Z7982 Long term (current) use of aspirin: Secondary | ICD-10-CM | POA: Insufficient documentation

## 2016-01-13 DIAGNOSIS — I10 Essential (primary) hypertension: Secondary | ICD-10-CM | POA: Insufficient documentation

## 2016-01-13 DIAGNOSIS — E119 Type 2 diabetes mellitus without complications: Secondary | ICD-10-CM | POA: Insufficient documentation

## 2016-01-13 DIAGNOSIS — Z87891 Personal history of nicotine dependence: Secondary | ICD-10-CM | POA: Insufficient documentation

## 2016-01-13 DIAGNOSIS — E785 Hyperlipidemia, unspecified: Secondary | ICD-10-CM | POA: Insufficient documentation

## 2016-01-13 LAB — CBC WITH DIFFERENTIAL/PLATELET
Basophils Absolute: 0 10*3/uL (ref 0.0–0.1)
Basophils Relative: 0 %
Eosinophils Absolute: 0 10*3/uL (ref 0.0–0.7)
Eosinophils Relative: 1 %
HCT: 37.3 % (ref 36.0–46.0)
Hemoglobin: 13 g/dL (ref 12.0–15.0)
Lymphocytes Relative: 30 %
Lymphs Abs: 2.6 10*3/uL (ref 0.7–4.0)
MCH: 29.6 pg (ref 26.0–34.0)
MCHC: 34.9 g/dL (ref 30.0–36.0)
MCV: 85 fL (ref 78.0–100.0)
Monocytes Absolute: 0.8 10*3/uL (ref 0.1–1.0)
Monocytes Relative: 9 %
Neutro Abs: 5.3 10*3/uL (ref 1.7–7.7)
Neutrophils Relative %: 60 %
Platelets: 388 10*3/uL (ref 150–400)
RBC: 4.39 MIL/uL (ref 3.87–5.11)
RDW: 14.6 % (ref 11.5–15.5)
WBC: 8.6 10*3/uL (ref 4.0–10.5)

## 2016-01-13 LAB — COMPREHENSIVE METABOLIC PANEL
ALT: 47 U/L (ref 14–54)
AST: 62 U/L — ABNORMAL HIGH (ref 15–41)
Albumin: 2.5 g/dL — ABNORMAL LOW (ref 3.5–5.0)
Alkaline Phosphatase: 72 U/L (ref 38–126)
Anion gap: 13 (ref 5–15)
BUN: 11 mg/dL (ref 6–20)
CO2: 23 mmol/L (ref 22–32)
Calcium: 8.2 mg/dL — ABNORMAL LOW (ref 8.9–10.3)
Chloride: 103 mmol/L (ref 101–111)
Creatinine, Ser: 1.43 mg/dL — ABNORMAL HIGH (ref 0.44–1.00)
GFR calc Af Amer: 44 mL/min — ABNORMAL LOW (ref >60)
GFR calc non Af Amer: 38 mL/min — ABNORMAL LOW (ref >60)
Glucose, Bld: 212 mg/dL — ABNORMAL HIGH (ref 65–99)
Potassium: 4.9 mmol/L (ref 3.5–5.1)
Sodium: 139 mmol/L (ref 135–145)
Total Bilirubin: 1.2 mg/dL (ref 0.3–1.2)
Total Protein: 5.9 g/dL — ABNORMAL LOW (ref 6.5–8.1)

## 2016-01-13 LAB — URINALYSIS, ROUTINE W REFLEX MICROSCOPIC
Glucose, UA: NEGATIVE mg/dL
HGB URINE DIPSTICK: NEGATIVE
KETONES UR: 40 mg/dL — AB
Nitrite: NEGATIVE
PROTEIN: NEGATIVE mg/dL
Specific Gravity, Urine: 1.014 (ref 1.005–1.030)
pH: 6 (ref 5.0–8.0)

## 2016-01-13 LAB — I-STAT CG4 LACTIC ACID, ED
Lactic Acid, Venous: 1.92 mmol/L (ref 0.5–2.0)
Lactic Acid, Venous: 1.47 mmol/L (ref 0.5–2.0)

## 2016-01-13 LAB — LIPASE, BLOOD: Lipase: 12 U/L (ref 11–51)

## 2016-01-13 LAB — URINE MICROSCOPIC-ADD ON: RBC / HPF: NONE SEEN RBC/hpf (ref 0–5)

## 2016-01-13 MED ORDER — HEPARIN SOD (PORK) LOCK FLUSH 100 UNIT/ML IV SOLN
500.0000 [IU] | INTRAVENOUS | Status: DC | PRN
  Administered 2016-01-13: 500 [IU]

## 2016-01-13 MED ORDER — GI COCKTAIL ~~LOC~~
30.0000 mL | Freq: Once | ORAL | Status: AC
  Administered 2016-01-13: 30 mL via ORAL
  Filled 2016-01-13: qty 30

## 2016-01-13 MED ORDER — HYDROMORPHONE HCL 1 MG/ML IJ SOLN
1.0000 mg | Freq: Once | INTRAMUSCULAR | Status: AC
  Administered 2016-01-13: 1 mg via INTRAVENOUS
  Filled 2016-01-13: qty 1

## 2016-01-13 MED ORDER — IOPAMIDOL (ISOVUE-370) INJECTION 76%
100.0000 mL | Freq: Once | INTRAVENOUS | Status: AC | PRN
  Administered 2016-01-13: 80 mL via INTRAVENOUS

## 2016-01-13 MED ORDER — HEPARIN SOD (PORK) LOCK FLUSH 100 UNIT/ML IV SOLN
500.0000 [IU] | INTRAVENOUS | Status: DC
Start: 2016-01-13 — End: 2016-01-14

## 2016-01-13 MED ORDER — SODIUM CHLORIDE 0.9 % IV BOLUS (SEPSIS)
1000.0000 mL | Freq: Once | INTRAVENOUS | Status: AC
Start: 2016-01-13 — End: 2016-01-13
  Administered 2016-01-13: 1000 mL via INTRAVENOUS

## 2016-01-13 MED ORDER — ALTEPLASE 2 MG IJ SOLR
2.0000 mg | Freq: Once | INTRAMUSCULAR | Status: AC
  Administered 2016-01-13: 2 mg
  Filled 2016-01-13: qty 2

## 2016-01-13 MED ORDER — PANTOPRAZOLE SODIUM 40 MG PO TBEC: 40.0000 mg | DELAYED_RELEASE_TABLET | Freq: Every day | ORAL | Status: DC

## 2016-01-13 MED ORDER — ONDANSETRON 4 MG PO TBDP: 4.0000 mg | ORAL_TABLET | Freq: Three times a day (TID) | ORAL | Status: DC | PRN

## 2016-01-13 MED ORDER — FAMOTIDINE IN NACL 20-0.9 MG/50ML-% IV SOLN: 20.0000 mg | Freq: Once | INTRAVENOUS | Status: DC

## 2016-01-13 MED ORDER — PANTOPRAZOLE SODIUM 40 MG PO TBEC
40.0000 mg | DELAYED_RELEASE_TABLET | Freq: Every day | ORAL | Status: DC
  Administered 2016-01-13: 40 mg via ORAL
  Filled 2016-01-13: qty 1

## 2016-01-13 MED ORDER — FAMOTIDINE IN NACL 20-0.9 MG/50ML-% IV SOLN
20.0000 mg | Freq: Once | INTRAVENOUS | Status: AC
  Administered 2016-01-13: 20 mg via INTRAVENOUS
  Filled 2016-01-13: qty 50

## 2016-01-13 NOTE — ED Notes (Signed)
Patient transported to CT 

## 2016-01-13 NOTE — ED Provider Notes (Signed)
CSN: [id number]     Arrival date & time 01/13/16  1405 History   First MD Initiated Contact with Patient 01/13/16 1737     Chief Complaint  Patient presents with  . Abdominal Pain     (Consider location/radiation/quality/duration/timing/severity/associated sxs/prior Treatment) HPI   65 year old female with abdominal pain. Unfortunately, she is not a very good historian. She reports gradual onset of abdominal pain which began over a week and a half ago. It is been persistent and progressive. She has a difficult time describing the character of the pain. Is diffuse. She has not noticed anything that seems to make it any better or any worse. She has had less of an appetite. Nauseated. No vomiting. No diarrhea but she has noticed that her stools have been very dark in color. No gross blood. She does have a past history of stomach cancer status post treatment including a partial gastrectomy. Per her report, it sounds like that this is felt to be in remission. No known history of peptic ulcer disease. Her medication list does include Protonix but she reports that she is not currently taking it. No fevers or chills. No urinary complaints. Denies significant NSAID usage.  Past Medical History  Diagnosis Date  . stomach ca dx'd 2010    chemo/xrt comp 05/2009  . Hypertension   . Diabetes mellitus   . Asthma   . Sleep apnea   . Degenerative joint disease   . Hyperlipidemia   . GERD (gastroesophageal reflux disease)   . Neuropathy ([place])     bilateral hands and feet   Past Surgical History  Procedure Laterality Date  . Total knee arthroplasty      Left  . Total hip arthroplasty      Right  . Shoulder surgery      Left  . Appendectomy    . Cesarean section    . Hernia repair    . Colon surgery    . Esophagogastroduodenoscopy N/A 10/15/2012    Procedure: ESOPHAGOGASTRODUODENOSCOPY (EGD);  Surgeon: Lear Ng, MD;  Location: [facility];  Service: Endoscopy;  Laterality: N/A;    Family History  Problem Relation Age of Onset  . Cancer Mother   . Alzheimer's disease Father   . Diabetes Brother   . Diabetes Maternal Grandmother   . Cancer Maternal Grandfather     lung  . Suicidality Neg Hx   . Depression Neg Hx   . Dementia Neg Hx   . Anxiety disorder Neg Hx    Social History  Substance Use Topics  . Smoking status: Former Smoker -- 1.00 packs/day for 15 years    Types: Cigarettes    Start date: 09/27/1973    Quit date: 10/15/1988  . Smokeless tobacco: Never Used     Comment: quit smoking 14 years ago  . Alcohol Use: No   OB History    Gravida Para Term Preterm AB TAB SAB Ectopic Multiple Living   4 4        4      Review of Systems  All systems reviewed and negative, other than as noted in HPI.   Allergies  Adhesive; Codeine; Zocor; and Penicillins  Home Medications   Prior to Admission medications   Medication Sig Start Date End Date Taking? Authorizing Provider  aspirin 81 MG tablet Take 81 mg by mouth at bedtime.   Yes Historical Provider, MD  carvedilol (COREG) 12.5 MG tablet Take 12.5 mg by mouth every morning. 07/20/15  Yes Historical Provider,  MD  carvedilol (COREG) 12.5 MG tablet TAKE 1 TABLET BY MOUTH BY EVERY MORNING 10/16/15  Yes Yvonne R Lowne Chase, DO  EMBEDA 30-1.2 MG CPCR TK ONE C PO ONCE D 12/24/15  Yes Historical Provider, MD  Gabapentin Enacarbil (HORIZANT) 600 MG TBCR Take 1 tablet by mouth 2 (two) times daily.   Yes Historical Provider, MD  LORazepam (ATIVAN) 0.5 MG tablet Take 1 tablet (0.5 mg total) by mouth at bedtime as needed for anxiety. 08/21/15  Yes Yvonne R Lowne Chase, DO  metaxalone (SKELAXIN) 800 MG tablet Take 1 tablet (800 mg total) by mouth 4 (four) times daily. Patient taking differently: Take 800 mg by mouth 4 (four) times daily as needed for muscle spasms.  04/06/15  Yes Yvonne R Lowne Chase, DO  metFORMIN (GLUCOPHAGE) 850 MG tablet Take 1 tablet (850 mg total) by mouth 2 (two) times daily. Repeat labs are  due now 12/30/15  Yes Philemon Kingdom, MD  morphine (MSIR) 15 MG tablet TK 1 T PO TID PRN 12/18/15  Yes Historical Provider, MD  Multiple Vitamin (MULTIVITAMIN WITH MINERALS) TABS tablet Take 1 tablet by mouth daily.   Yes Historical Provider, MD  ondansetron (ZOFRAN ODT) 4 MG disintegrating tablet Take 1 tablet (4 mg total) by mouth every 8 (eight) hours as needed for nausea or vomiting. 01/13/16  Yes Kristen N [place], DO  pantoprazole (PROTONIX) 40 MG tablet Take 1 tablet (40 mg total) by mouth daily. 11/17/15  Yes Yvonne R Lowne Chase, DO  ALPRAZolam Duanne Moron) 0.5 MG tablet Take 1 tablet 30 minutes prior to your procedure and take the bottle with you to your MRI Patient not taking: Reported on 01/13/2016 11/02/15   Alferd Apa Lowne Chase, DO  azelastine (ASTELIN) 0.1 % nasal spray Place 1 spray into both nostrils 2 (two) times daily. Use in each nostril as directed Patient not taking: Reported on 01/13/2016 01/13/15   Alferd Apa Lowne Chase, DO  azithromycin (ZITHROMAX) 250 MG tablet Take per package instructions. Patient not taking: Reported on 01/13/2016 01/07/16   Rosalita Chessman Chase, DO  DULoxetine (CYMBALTA) 60 MG capsule Take 1 capsule (60 mg total) by mouth 2 (two) times daily. 06/09/15   Rosalita Chessman Chase, DO  glimepiride (AMARYL) 4 MG tablet TAKE 1 TABLET BY MOUTH TWICE A DAY 09/17/15   Yvonne R Lowne Chase, DO  hydrochlorothiazide (HYDRODIURIL) 25 MG tablet TAKE 1 TABLET (25 MG TOTAL) BY MOUTH DAILY. 10/16/15   Rosalita Chessman Chase, DO  lidocaine-prilocaine (EMLA) cream Apply 1 application topically as needed. 06/29/15   Volanda Napoleon, MD  montelukast (SINGULAIR) 10 MG tablet Take 1 tablet (10 mg total) by mouth at bedtime. Patient not taking: Reported on 01/13/2016 04/29/15   Rosalita Chessman Chase, DO  moxifloxacin (VIGAMOX) 0.5 % ophthalmic solution Place 1 drop into both eyes 3 (three) times daily. Patient not taking: Reported on 01/13/2016 01/04/16   Alferd Apa Lowne Chase, DO  ondansetron (ZOFRAN) 4  MG tablet Take 1 tablet (4 mg total) by mouth every 8 (eight) hours as needed for nausea or vomiting. Patient not taking: Reported on 01/13/2016 01/04/16   Alferd Apa Lowne Chase, DO   BP 97/66 mmHg  Pulse 87  Temp(Src) 98.5 F (36.9 C) (Oral)  Resp 18  Ht 5' 7"  (1.702 m)  Wt 233 lb (105.688 kg)  BMI 36.48 kg/m2  SpO2 100% Physical Exam  Constitutional: She appears well-developed and well-nourished. No distress.  HENT:  Head: Normocephalic and atraumatic.  Eyes: Conjunctivae are normal. Right eye exhibits no discharge. Left eye exhibits no discharge.  Neck: Neck supple.  Cardiovascular: Normal rate, regular rhythm and normal heart sounds.  Exam reveals no gallop and no friction rub.   No murmur heard. Pulmonary/Chest: Effort normal and breath sounds normal. No respiratory distress.  Abdominal: Soft. She exhibits no distension. There is tenderness.  Mild, diffuse abdominal tenderness without rebound or guarding. No distention.  Musculoskeletal: She exhibits no edema or tenderness.  Neurological: She is alert.  Skin: Skin is warm and dry.  Psychiatric: She has a normal mood and affect. Her behavior is normal. Thought content normal.  Nursing note and vitals reviewed.   ED Course  Procedures (including critical care time) Labs Review Labs Reviewed  COMPREHENSIVE METABOLIC PANEL  LIPASE, BLOOD  CBC WITH DIFFERENTIAL/PLATELET  I-STAT CG4 LACTIC ACID, ED    Imaging Review Dg Abd Acute W/chest  01/13/2016  CLINICAL DATA:  Acute onset of diarrhea and generalized abdominal pain. Initial encounter. EXAM: DG ABDOMEN ACUTE W/ 1V CHEST COMPARISON:  Chest radiograph performed 11/23/2015, and CT of the abdomen and pelvis from 01/04/2016 FINDINGS: The lungs are well-aerated and clear. There is no evidence of focal opacification, pleural effusion or pneumothorax. The cardiomediastinal silhouette is within normal limits. A right-sided chest port is noted ending about the mid SVC. The visualized  bowel gas pattern is unremarkable. Scattered stool and air are seen within the colon; there is no evidence of small bowel dilatation to suggest obstruction. No free intra-abdominal air is identified on the provided upright view. No acute osseous abnormalities are seen; the sacroiliac joints are unremarkable in appearance. A right-sided hip arthroplasty is grossly unremarkable in appearance, without evidence of loosening, though incompletely imaged. IMPRESSION: 1. Unremarkable bowel gas pattern; no free intra-abdominal air seen. 2. No acute cardiopulmonary process seen. Electronically Signed   By: Garald Balding M.D.   On: 01/13/2016 00:25   I have personally reviewed and evaluated these images and lab results as part of my medical decision-making.   EKG Interpretation None      MDM   Final diagnoses:  Epigastric pain    65 year old female with abdominal pain. Ongoing for over a week. Vague historian. Her pain does seem out of proportion of her exam. She has CT abdomen pelvis just recently which was fairly unrevealing. This may potentially be mesenteric ischemia although she does not report a clear change in symptoms postprandially. Pain is concerning enough that will obtain CT angiography today. Will check lactic acid in addition to other labs. Will treat symptoms and reassess.  Workup fairly unrevealing. Patient continues to have pain although improved with medicines received in the emergency room. I do not have an exact etiology of her symptoms, but have a low suspicion for emergent process. Reassurance was provided. Return precautions were discussed. Outpatient follow-up otherwise.   Virgel Manifold, MD 01/19/16 1128

## 2016-01-13 NOTE — ED Notes (Signed)
IV team at bedside 

## 2016-01-13 NOTE — ED Notes (Addendum)
Pt reports generalized abd pain for the past week. Had one episode of diarrhea last night. No nv. Was seen at [facility] last night for same. Reports she is not feeling better

## 2016-01-13 NOTE — Telephone Encounter (Signed)
Patient calling office c/o abdominal pain. She is moaning loudly and speaking incoherently at times. Reviewed chart and patient was seen in the hospital last night, and has an appointment with her PCP today for the same complaint. Based on the severity of the reported pain, and the patient's vocal expressions of pain (moaning, some crying, and some screaming/shreiking) she was advised to be seen in the ED. Explained that doctor's offices are not equipped to assess and treat abdominal pain to her level of reporting and she needed more acute, timely workup. She would not comment on whether or not she should go to the ED, however she did again confirm that she had an appointment with her PCP. It was again suggested that she go back to the ED but again she wouldn't confirm whether or not this was her intention. She said thank you for the advice and ended the call.   Spoke to Dr Marin Olp who agrees with the recommendation to go to the ED.

## 2016-01-13 NOTE — ED Notes (Signed)
IV team called in regards to removing tPA from port

## 2016-01-13 NOTE — ED Notes (Signed)
MD at bedside. 

## 2016-01-13 NOTE — ED Notes (Signed)
I attempted to collect labs and was unsuccessful. 

## 2016-01-13 NOTE — Telephone Encounter (Addendum)
Patient is going to urgent care cancelling her 2:30pm appointment. Charge or no charge

## 2016-01-13 NOTE — ED Notes (Signed)
Pt ambulatory and independent at discharge.  Wheeled patient to lobby and pt left with her daughter.

## 2016-01-13 NOTE — ED Notes (Signed)
Patient ambulatory to the restroom moaning all the way there and back. Patient with minimal assistance. =

## 2016-01-13 NOTE — Discharge Instructions (Signed)

## 2016-01-13 NOTE — Telephone Encounter (Signed)
No charge. 

## 2016-01-13 NOTE — ED Notes (Signed)
This RN went to start an IV on patient.  Pt refused to allow this RN to access port, stating "I only let the cancer center do it, and I didn't bring my cream".  Pt would also only let this RN attempt in the antecubital space.  Pt refused to attempt access on her hands or forearms.  Both attempts on AC unsuccessful.  Will put in consult for IV team.

## 2016-01-14 ENCOUNTER — Encounter (HOSPITAL_COMMUNITY): Payer: Self-pay

## 2016-01-14 ENCOUNTER — Telehealth: Payer: Self-pay | Admitting: Family Medicine

## 2016-01-14 ENCOUNTER — Telehealth: Payer: Self-pay | Admitting: *Deleted

## 2016-01-14 ENCOUNTER — Inpatient Hospital Stay (HOSPITAL_COMMUNITY)
Admission: EM | Admit: 2016-01-14 | Discharge: 2016-01-20 | DRG: 392 | Disposition: A | Discharge status: Home / Self Care | Payer: Medicare HMO | Attending: Internal Medicine | Admitting: Internal Medicine

## 2016-01-14 ENCOUNTER — Telehealth: Payer: Self-pay

## 2016-01-14 DIAGNOSIS — K30 Functional dyspepsia: Principal | ICD-10-CM | POA: Diagnosis present

## 2016-01-14 DIAGNOSIS — Z9221 Personal history of antineoplastic chemotherapy: Secondary | ICD-10-CM

## 2016-01-14 DIAGNOSIS — R110 Nausea: Secondary | ICD-10-CM | POA: Diagnosis present

## 2016-01-14 DIAGNOSIS — R740 Nonspecific elevation of levels of transaminase and lactic acid dehydrogenase [LDH]: Secondary | ICD-10-CM

## 2016-01-14 DIAGNOSIS — E1121 Type 2 diabetes mellitus with diabetic nephropathy: Secondary | ICD-10-CM | POA: Diagnosis present

## 2016-01-14 DIAGNOSIS — K9289 Other specified diseases of the digestive system: Secondary | ICD-10-CM | POA: Diagnosis present

## 2016-01-14 DIAGNOSIS — Z96641 Presence of right artificial hip joint: Secondary | ICD-10-CM | POA: Diagnosis present

## 2016-01-14 DIAGNOSIS — Z820 Family history of epilepsy and other diseases of the nervous system: Secondary | ICD-10-CM

## 2016-01-14 DIAGNOSIS — G894 Chronic pain syndrome: Secondary | ICD-10-CM | POA: Diagnosis not present

## 2016-01-14 DIAGNOSIS — Z833 Family history of diabetes mellitus: Secondary | ICD-10-CM

## 2016-01-14 DIAGNOSIS — C168 Malignant neoplasm of overlapping sites of stomach: Secondary | ICD-10-CM

## 2016-01-14 DIAGNOSIS — R197 Diarrhea, unspecified: Secondary | ICD-10-CM | POA: Diagnosis not present

## 2016-01-14 DIAGNOSIS — R1084 Generalized abdominal pain: Secondary | ICD-10-CM | POA: Diagnosis not present

## 2016-01-14 DIAGNOSIS — Z903 Acquired absence of stomach [part of]: Secondary | ICD-10-CM

## 2016-01-14 DIAGNOSIS — C169 Malignant neoplasm of stomach, unspecified: Secondary | ICD-10-CM | POA: Diagnosis present

## 2016-01-14 DIAGNOSIS — Z7982 Long term (current) use of aspirin: Secondary | ICD-10-CM

## 2016-01-14 DIAGNOSIS — E1165 Type 2 diabetes mellitus with hyperglycemia: Secondary | ICD-10-CM | POA: Diagnosis present

## 2016-01-14 DIAGNOSIS — Z934 Other artificial openings of gastrointestinal tract status: Secondary | ICD-10-CM

## 2016-01-14 DIAGNOSIS — L02415 Cutaneous abscess of right lower limb: Secondary | ICD-10-CM | POA: Diagnosis present

## 2016-01-14 DIAGNOSIS — R1013 Epigastric pain: Secondary | ICD-10-CM

## 2016-01-14 DIAGNOSIS — E1142 Type 2 diabetes mellitus with diabetic polyneuropathy: Secondary | ICD-10-CM | POA: Diagnosis present

## 2016-01-14 DIAGNOSIS — Z79891 Long term (current) use of opiate analgesic: Secondary | ICD-10-CM

## 2016-01-14 DIAGNOSIS — N183 Chronic kidney disease, stage 3 unspecified: Secondary | ICD-10-CM | POA: Diagnosis present

## 2016-01-14 DIAGNOSIS — I10 Essential (primary) hypertension: Secondary | ICD-10-CM | POA: Diagnosis present

## 2016-01-14 DIAGNOSIS — E1122 Type 2 diabetes mellitus with diabetic chronic kidney disease: Secondary | ICD-10-CM | POA: Diagnosis present

## 2016-01-14 DIAGNOSIS — G4730 Sleep apnea, unspecified: Secondary | ICD-10-CM | POA: Diagnosis present

## 2016-01-14 DIAGNOSIS — Z87891 Personal history of nicotine dependence: Secondary | ICD-10-CM

## 2016-01-14 DIAGNOSIS — Z96652 Presence of left artificial knee joint: Secondary | ICD-10-CM | POA: Diagnosis present

## 2016-01-14 DIAGNOSIS — K219 Gastro-esophageal reflux disease without esophagitis: Secondary | ICD-10-CM | POA: Diagnosis present

## 2016-01-14 DIAGNOSIS — K449 Diaphragmatic hernia without obstruction or gangrene: Secondary | ICD-10-CM | POA: Diagnosis present

## 2016-01-14 DIAGNOSIS — Z85028 Personal history of other malignant neoplasm of stomach: Secondary | ICD-10-CM

## 2016-01-14 DIAGNOSIS — Z6836 Body mass index (BMI) 36.0-36.9, adult: Secondary | ICD-10-CM

## 2016-01-14 DIAGNOSIS — Z923 Personal history of irradiation: Secondary | ICD-10-CM

## 2016-01-14 DIAGNOSIS — E785 Hyperlipidemia, unspecified: Secondary | ICD-10-CM | POA: Diagnosis present

## 2016-01-14 DIAGNOSIS — R7401 Elevation of levels of liver transaminase levels: Secondary | ICD-10-CM

## 2016-01-14 DIAGNOSIS — E876 Hypokalemia: Secondary | ICD-10-CM | POA: Diagnosis present

## 2016-01-14 DIAGNOSIS — R109 Unspecified abdominal pain: Secondary | ICD-10-CM

## 2016-01-14 DIAGNOSIS — Z79899 Other long term (current) drug therapy: Secondary | ICD-10-CM

## 2016-01-14 DIAGNOSIS — N182 Chronic kidney disease, stage 2 (mild): Secondary | ICD-10-CM | POA: Diagnosis present

## 2016-01-14 DIAGNOSIS — D573 Sickle-cell trait: Secondary | ICD-10-CM | POA: Diagnosis present

## 2016-01-14 HISTORY — DX: Sickle-cell trait: D57.3

## 2016-01-14 LAB — DIFFERENTIAL
BASOS ABS: 0 10*3/uL (ref 0.0–0.1)
Basophils Relative: 0 %
EOS ABS: 0.2 10*3/uL (ref 0.0–0.7)
Eosinophils Relative: 2 %
LYMPHS ABS: 3.4 10*3/uL (ref 0.7–4.0)
LYMPHS PCT: 36 %
Monocytes Absolute: 1.1 10*3/uL — ABNORMAL HIGH (ref 0.1–1.0)
Monocytes Relative: 11 %
NEUTROS PCT: 51 %
Neutro Abs: 4.8 10*3/uL (ref 1.7–7.7)

## 2016-01-14 LAB — COMPREHENSIVE METABOLIC PANEL
ALBUMIN: 2.6 g/dL — AB (ref 3.5–5.0)
ALT: 46 U/L (ref 14–54)
AST: 58 U/L — AB (ref 15–41)
Alkaline Phosphatase: 72 U/L (ref 38–126)
Anion gap: 13 (ref 5–15)
BUN: 8 mg/dL (ref 6–20)
CHLORIDE: 104 mmol/L (ref 101–111)
CO2: 23 mmol/L (ref 22–32)
Calcium: 8.3 mg/dL — ABNORMAL LOW (ref 8.9–10.3)
Creatinine, Ser: 1.37 mg/dL — ABNORMAL HIGH (ref 0.44–1.00)
GFR calc Af Amer: 46 mL/min — ABNORMAL LOW (ref >60)
GFR calc non Af Amer: 40 mL/min — ABNORMAL LOW (ref >60)
GLUCOSE: 177 mg/dL — AB (ref 65–99)
Potassium: 3.5 mmol/L (ref 3.5–5.1)
Sodium: 140 mmol/L (ref 135–145)
Total Bilirubin: 1.3 mg/dL — ABNORMAL HIGH (ref 0.3–1.2)
Total Protein: 6 g/dL — ABNORMAL LOW (ref 6.5–8.1)

## 2016-01-14 LAB — I-STAT CG4 LACTIC ACID, ED: LACTIC ACID, VENOUS: 1.34 mmol/L (ref 0.5–2.0)

## 2016-01-14 LAB — URINALYSIS, ROUTINE W REFLEX MICROSCOPIC
Glucose, UA: NEGATIVE mg/dL
Hgb urine dipstick: NEGATIVE
KETONES UR: 40 mg/dL — AB
NITRITE: NEGATIVE
PH: 6 (ref 5.0–8.0)
Protein, ur: NEGATIVE mg/dL
Specific Gravity, Urine: 1.025 (ref 1.005–1.030)

## 2016-01-14 LAB — CBC
HEMATOCRIT: 33.1 % — AB (ref 36.0–46.0)
Hemoglobin: 12 g/dL (ref 12.0–15.0)
MCH: 30.1 pg (ref 26.0–34.0)
MCHC: 36.3 g/dL — ABNORMAL HIGH (ref 30.0–36.0)
MCV: 83 fL (ref 78.0–100.0)
Platelets: 298 10*3/uL (ref 150–400)
RBC: 3.99 MIL/uL (ref 3.87–5.11)
RDW: 14.6 % (ref 11.5–15.5)
WBC: 9.4 10*3/uL (ref 4.0–10.5)

## 2016-01-14 LAB — LIPASE, BLOOD: LIPASE: 14 U/L (ref 11–51)

## 2016-01-14 LAB — URINE MICROSCOPIC-ADD ON

## 2016-01-14 MED ORDER — CHLORHEXIDINE GLUCONATE 0.12 % MT SOLN
15.0000 mL | Freq: Two times a day (BID) | OROMUCOSAL | Status: DC
  Administered 2016-01-15 – 2016-01-20 (×5): 15 mL via OROMUCOSAL
  Filled 2016-01-14 (×8): qty 15

## 2016-01-14 MED ORDER — GABAPENTIN ENACARBIL ER 600 MG PO TBCR: 1.0000 | EXTENDED_RELEASE_TABLET | Freq: Two times a day (BID) | ORAL | Status: DC

## 2016-01-14 MED ORDER — SODIUM CHLORIDE 0.9 % IV SOLN
1000.0000 mL | Freq: Once | INTRAVENOUS | Status: AC
  Administered 2016-01-14: 1000 mL via INTRAVENOUS

## 2016-01-14 MED ORDER — INSULIN ASPART 100 UNIT/ML ~~LOC~~ SOLN
0.0000 [IU] | SUBCUTANEOUS | Status: DC
  Administered 2016-01-15 (×2): 2 [IU] via SUBCUTANEOUS
  Administered 2016-01-15 – 2016-01-16 (×2): 3 [IU] via SUBCUTANEOUS
  Administered 2016-01-16: 5 [IU] via SUBCUTANEOUS
  Administered 2016-01-16: 3 [IU] via SUBCUTANEOUS
  Administered 2016-01-16: 2 [IU] via SUBCUTANEOUS
  Administered 2016-01-17: 3 [IU] via SUBCUTANEOUS
  Administered 2016-01-17: 5 [IU] via SUBCUTANEOUS
  Administered 2016-01-18: 8 [IU] via SUBCUTANEOUS
  Administered 2016-01-18: 3 [IU] via SUBCUTANEOUS
  Administered 2016-01-19 (×2): 0 [IU] via SUBCUTANEOUS
  Administered 2016-01-20: 2 [IU] via SUBCUTANEOUS

## 2016-01-14 MED ORDER — ACETAMINOPHEN 325 MG PO TABS: 650.0000 mg | ORAL_TABLET | Freq: Four times a day (QID) | ORAL | Status: DC | PRN

## 2016-01-14 MED ORDER — CARVEDILOL 6.25 MG PO TABS
6.2500 mg | ORAL_TABLET | Freq: Two times a day (BID) | ORAL | Status: DC
  Administered 2016-01-15 – 2016-01-20 (×10): 6.25 mg via ORAL
  Filled 2016-01-14 (×10): qty 1

## 2016-01-14 MED ORDER — SODIUM CHLORIDE 0.9 % IV SOLN
1000.0000 mL | INTRAVENOUS | Status: DC
  Administered 2016-01-14 – 2016-01-17 (×2): 1000 mL via INTRAVENOUS

## 2016-01-14 MED ORDER — METAXALONE 800 MG PO TABS
800.0000 mg | ORAL_TABLET | Freq: Four times a day (QID) | ORAL | Status: DC | PRN
Start: 2016-01-14 — End: 2016-01-20
  Filled 2016-01-14: qty 1

## 2016-01-14 MED ORDER — MORPHINE SULFATE (PF) 4 MG/ML IV SOLN
4.0000 mg | Freq: Once | INTRAVENOUS | Status: AC
  Administered 2016-01-14: 4 mg via INTRAVENOUS
  Filled 2016-01-14: qty 1

## 2016-01-14 MED ORDER — HYDROCHLOROTHIAZIDE 25 MG PO TABS
25.0000 mg | ORAL_TABLET | Freq: Every day | ORAL | Status: DC
  Administered 2016-01-15 – 2016-01-20 (×6): 25 mg via ORAL
  Filled 2016-01-14 (×6): qty 1

## 2016-01-14 MED ORDER — DULOXETINE HCL 60 MG PO CPEP
60.0000 mg | ORAL_CAPSULE | Freq: Two times a day (BID) | ORAL | Status: DC
  Administered 2016-01-15 – 2016-01-20 (×12): 60 mg via ORAL
  Filled 2016-01-14 (×12): qty 1

## 2016-01-14 MED ORDER — INSULIN GLARGINE 100 UNIT/ML ~~LOC~~ SOLN
5.0000 [IU] | Freq: Every day | SUBCUTANEOUS | Status: DC
  Administered 2016-01-15 – 2016-01-19 (×6): 5 [IU] via SUBCUTANEOUS
  Filled 2016-01-14 (×8): qty 0.05

## 2016-01-14 MED ORDER — CETYLPYRIDINIUM CHLORIDE 0.05 % MT LIQD
7.0000 mL | Freq: Two times a day (BID) | OROMUCOSAL | Status: DC
  Administered 2016-01-15 – 2016-01-19 (×2): 7 mL via OROMUCOSAL

## 2016-01-14 MED ORDER — PANTOPRAZOLE SODIUM 40 MG PO TBEC
40.0000 mg | DELAYED_RELEASE_TABLET | Freq: Every day | ORAL | Status: DC
  Administered 2016-01-15 – 2016-01-20 (×6): 40 mg via ORAL
  Filled 2016-01-14 (×6): qty 1

## 2016-01-14 MED ORDER — MORPHINE SULFATE (PF) 4 MG/ML IV SOLN
4.0000 mg | INTRAVENOUS | Status: DC | PRN
  Administered 2016-01-15 – 2016-01-19 (×14): 4 mg via INTRAVENOUS
  Filled 2016-01-14 (×15): qty 1

## 2016-01-14 MED ORDER — ONDANSETRON HCL 4 MG PO TABS
4.0000 mg | ORAL_TABLET | Freq: Four times a day (QID) | ORAL | Status: DC | PRN
  Administered 2016-01-17 – 2016-01-19 (×2): 4 mg via ORAL

## 2016-01-14 MED ORDER — ONDANSETRON HCL 4 MG/2ML IJ SOLN
4.0000 mg | Freq: Four times a day (QID) | INTRAMUSCULAR | Status: DC | PRN
  Administered 2016-01-15 – 2016-01-18 (×10): 4 mg via INTRAVENOUS
  Filled 2016-01-14 (×12): qty 2

## 2016-01-14 MED ORDER — POTASSIUM CHLORIDE 2 MEQ/ML IV SOLN
INTRAVENOUS | Status: AC
  Administered 2016-01-15 – 2016-01-16 (×6): via INTRAVENOUS
  Filled 2016-01-14 (×11): qty 1000

## 2016-01-14 MED ORDER — ASPIRIN EC 81 MG PO TBEC
81.0000 mg | DELAYED_RELEASE_TABLET | Freq: Every day | ORAL | Status: DC
  Administered 2016-01-15 – 2016-01-19 (×6): 81 mg via ORAL
  Filled 2016-01-14 (×6): qty 1

## 2016-01-14 MED ORDER — ENOXAPARIN SODIUM 40 MG/0.4ML ~~LOC~~ SOLN
40.0000 mg | Freq: Every day | SUBCUTANEOUS | Status: DC
  Administered 2016-01-15 – 2016-01-19 (×6): 40 mg via SUBCUTANEOUS
  Filled 2016-01-14 (×6): qty 0.4

## 2016-01-14 MED ORDER — ACETAMINOPHEN 650 MG RE SUPP: 650.0000 mg | Freq: Four times a day (QID) | RECTAL | Status: DC | PRN

## 2016-01-14 MED ORDER — ONDANSETRON HCL 4 MG/2ML IJ SOLN
4.0000 mg | Freq: Once | INTRAMUSCULAR | Status: AC
  Administered 2016-01-14: 4 mg via INTRAVENOUS
  Filled 2016-01-14: qty 2

## 2016-01-14 NOTE — Telephone Encounter (Signed)
Yes---  If that is who she is established with get her in with DR Michail Sermon

## 2016-01-14 NOTE — H&P (Signed)
History and Physical    [name] EYC:[id number] DOB: 12/25/1950 DOA: 01/14/2016  PCP: Ann Held, DO Patient coming from: home  Chief Complaint: abdominal pain  HPI: [name] is a 65 y.o. female with medical history significant of DM, GERD, and stomach cancer in 2010 presening with abdominal pain.  Pain has been going on for over 1 week.   Has not eaten in 4-5 days.   When pain started, had been eating chicken noodle soup constantly with crackers. Was seen by [facility] June 12 and they were concerned about unexplained weight loss - was 250 and now in 230 range over the last month; she did have TTP in LLQ at that visit.  Pain started acutely a few days later in the same location. Since onset, pain is constant.  Feels like a nagging sensation with occasional bouts of more severe pain.  Has intermittent vomiting.  Stool looks dark green and loose.  Denies recent antibiotics.  +diarrhea - 3 stools today, small.  +chills, no fevers.  No sick contacts.  No camping.  Pain is now throughout lower abdomen.   Able to keep water down but unable to keep food down (ate 1 cracker today and vomited).  Did have stomach cancer in 2010, had 2/3 of stomach removed.  Also had chemo/radiation.  Sees Dr. Marin Olp, last saw ?Marland Kitchen  Presented at the time of stomach cancer diagnosis with R-sided back pain.  She is currently due for EGD, scheduled to be seen by GI in August.  Has not had an EGD in several years.    Does have chronic neuropathy of hands/feet and is followed by pain clinic.  Takes morphine chronically which helps hands - but has not been taking for abdominal pain due to vomiting.     ED Course: multiple ER visits with generally unremarkable labs/studies including CT and CTA  Review of Systems: As per HPI otherwise 10 point review of systems reviewed and negative.   Ambulatory Status: ambulates without assistance  Past Medical History  Diagnosis Date  . stomach ca dx'd 2010    chemo/xrt comp  05/2009  . Hypertension     actually taking for DM renal protection, not DM  . Diabetes mellitus   . Asthma   . Sleep apnea   . Degenerative joint disease   . Hyperlipidemia     no longer on medication for this  . GERD (gastroesophageal reflux disease)   . Neuropathy ([place])     bilateral hands and feet  . Sickle cell trait [facility])     Past Surgical History  Procedure Laterality Date  . Total knee arthroplasty      Left  . Total hip arthroplasty      Right  . Shoulder surgery      Left  . Appendectomy    . Cesarean section    . Hernia repair    . Colon surgery    . Esophagogastroduodenoscopy N/A 10/15/2012    Procedure: ESOPHAGOGASTRODUODENOSCOPY (EGD);  Surgeon: Lear Ng, MD;  Location: [facility];  Service: Endoscopy;  Laterality: N/A;    Social History   Social History  . Marital Status: Divorced    Spouse Name: N/A  . Number of Children: N/A  . Years of Education: N/A   Occupational History  . Not on file.   Social History Main Topics  . Smoking status: Former Smoker -- 1.00 packs/day for 15 years    Types: Cigarettes    Start  date: 09/27/1973    Quit date: 10/15/1988  . Smokeless tobacco: Never Used     Comment: quit smoking 14 years ago  . Alcohol Use: No  . Drug Use: No  . Sexual Activity: No   Other Topics Concern  . Not on file   Social History Narrative    Allergies  Allergen Reactions  . Adhesive [Tape] Other (See Comments)    Burn Skin  . Codeine Other (See Comments)    paranoid  . Zocor [Simvastatin - High Dose] Other (See Comments)    Muscle spasms  . Penicillins Rash    Has patient had a PCN reaction causing immediate rash, facial/tongue/throat swelling, SOB or lightheadedness with hypotension: Yes Has patient had a PCN reaction causing severe rash involving mucus membranes or skin necrosis: No Has patient had a PCN reaction that required hospitalization does not remember.  Has patient had a PCN reaction occurring within  the last 10 years: No If all of the above answers are "NO", then may proceed with Cephalosporin use.     Family History  Problem Relation Age of Onset  . Cancer Mother 77  . Alzheimer's disease Father 70  . Diabetes Brother   . Diabetes Maternal Grandmother   . Cancer Maternal Grandfather     lung  . Suicidality Neg Hx   . Depression Neg Hx   . Dementia Neg Hx   . Anxiety disorder Neg Hx     Prior to Admission medications   Medication Sig Start Date End Date Taking? Authorizing Provider  aspirin 81 MG tablet Take 81 mg by mouth at bedtime.   Yes Historical Provider, MD  carvedilol (COREG) 12.5 MG tablet TAKE 1 TABLET BY MOUTH BY EVERY MORNING 10/16/15  Yes Yvonne R Lowne Chase, DO  DULoxetine (CYMBALTA) 60 MG capsule Take 1 capsule (60 mg total) by mouth 2 (two) times daily. 06/09/15  Yes Yvonne R Lowne Chase, DO  EMBEDA 30-1.2 MG CPCR TK ONE C PO ONCE D 12/24/15  Yes Historical Provider, MD  Gabapentin Enacarbil (HORIZANT) 600 MG TBCR Take 1 tablet by mouth 2 (two) times daily.   Yes Historical Provider, MD  glimepiride (AMARYL) 4 MG tablet TAKE 1 TABLET BY MOUTH TWICE A DAY 09/17/15  Yes Yvonne R Lowne Chase, DO  hydrochlorothiazide (HYDRODIURIL) 25 MG tablet TAKE 1 TABLET (25 MG TOTAL) BY MOUTH DAILY. 10/16/15  Yes Yvonne R Lowne Chase, DO  lidocaine-prilocaine (EMLA) cream Apply 1 application topically as needed. 06/29/15  Yes Volanda Napoleon, MD  LORazepam (ATIVAN) 0.5 MG tablet Take 1 tablet (0.5 mg total) by mouth at bedtime as needed for anxiety. 08/21/15  Yes Yvonne R Lowne Chase, DO  metaxalone (SKELAXIN) 800 MG tablet Take 1 tablet (800 mg total) by mouth 4 (four) times daily. Patient taking differently: Take 800 mg by mouth 4 (four) times daily as needed for muscle spasms.  04/06/15  Yes Yvonne R Lowne Chase, DO  metFORMIN (GLUCOPHAGE) 850 MG tablet Take 1 tablet (850 mg total) by mouth 2 (two) times daily. Repeat labs are due now 12/30/15  Yes Philemon Kingdom, MD  morphine (MSIR)  15 MG tablet TK 1 T PO TID PRN 12/18/15  Yes Historical Provider, MD  Multiple Vitamin (MULTIVITAMIN WITH MINERALS) TABS tablet Take 1 tablet by mouth daily.   Yes Historical Provider, MD  ondansetron (ZOFRAN ODT) 4 MG disintegrating tablet Take 1 tablet (4 mg total) by mouth every 8 (eight) hours as needed for nausea or vomiting. 01/13/16  Yes Kristen N Ward, DO  pantoprazole (PROTONIX) 40 MG tablet Take 1 tablet (40 mg total) by mouth daily. 01/13/16  Yes Virgel Manifold, MD  ALPRAZolam Duanne Moron) 0.5 MG tablet Take 1 tablet 30 minutes prior to your procedure and take the bottle with you to your MRI Patient not taking: Reported on 01/13/2016 11/02/15   Alferd Apa Lowne Chase, DO  azithromycin (ZITHROMAX) 250 MG tablet Take per package instructions. Patient not taking: Reported on 01/13/2016 01/07/16   Alferd Apa Lowne Chase, DO  montelukast (SINGULAIR) 10 MG tablet Take 1 tablet (10 mg total) by mouth at bedtime. Patient not taking: Reported on 01/13/2016 04/29/15   Rosalita Chessman Chase, DO  moxifloxacin (VIGAMOX) 0.5 % ophthalmic solution Place 1 drop into both eyes 3 (three) times daily. Patient not taking: Reported on 01/13/2016 01/04/16   Alferd Apa Lowne Chase, DO  ondansetron (ZOFRAN) 4 MG tablet Take 1 tablet (4 mg total) by mouth every 8 (eight) hours as needed for nausea or vomiting. Patient not taking: Reported on 01/13/2016 01/04/16   Ann Held, DO    Physical Exam: Filed Vitals:   01/14/16 1710 01/14/16 1931 01/14/16 2124 01/14/16 2333  BP: 127/77 103/64 112/66 127/81  Pulse: 117 72 73 70  Temp:    98.3 F (36.8 C)  TempSrc:    Oral  Resp: 18 18 16 18   Height:    5' 7"  (1.702 m)  Weight:    107.5 kg (236 lb 15.9 oz)  SpO2: 98% 100% 100% 92%     General: Appears calm and comfortable with occasional apparent bouts of discomfort Eyes:  PERRL, EOMI, normal lids, iris ENT:  grossly normal hearing, lips & tongue, mmm Neck:  no LAD, masses or thyromegaly Cardiovascular:  RRR, no m/r/g.  No LE edema.  Respiratory:  CTA bilaterally, no w/r/r. Normal respiratory effort. Abdomen: NABS, possibly mildly hypoactive; diffusely TTP but primarily in LUQ and LLQ; no guarding/rebound Skin:  no rash or induration seen on limited exam Musculoskeletal:  grossly normal tone BUE/BLE, good ROM, no bony abnormality Psychiatric:  grossly normal mood and affect, speech fluent and appropriate, AOx3 Neurologic:  CN 2-12 grossly intact, moves all extremities in coordinated fashion, sensation intact  Labs on Admission: I have personally reviewed following labs and imaging studies  CBC:  Recent Labs Lab 01/12/16 2300 01/13/16 1927 01/14/16 2009  WBC 8.4 8.6 9.4  NEUTROABS  --  5.3 4.8  HGB 14.0 13.0 12.0  HCT 39.9 37.3 33.1*  MCV 84.4 85.0 83.0  PLT 361 388 836   Basic Metabolic Panel:  Recent Labs Lab 01/12/16 2300 01/13/16 1940 01/14/16 2009  NA 139 139 140  K 3.9 4.9 3.5  CL 100* 103 104  CO2 26 23 23   GLUCOSE 199* 212* 177*  BUN 13 11 8   CREATININE 1.35* 1.43* 1.37*  CALCIUM 8.7* 8.2* 8.3*   GFR: Estimated Creatinine Clearance: 53.1 mL/min (by C-G formula based on Cr of 1.37). Liver Function Tests:  Recent Labs Lab 01/12/16 2300 01/13/16 1940 01/14/16 2009  AST 53* 62* 58*  ALT 54 47 46  ALKPHOS 93 72 72  BILITOT 1.0 1.2 1.3*  PROT 6.8 5.9* 6.0*  ALBUMIN 2.8* 2.5* 2.6*    Recent Labs Lab 01/12/16 2300 01/13/16 1940 01/14/16 2009  LIPASE <10* 12 14   No results for input(s): AMMONIA in the last 168 hours. Coagulation Profile: No results for input(s): INR, PROTIME in the last 168 hours. Cardiac Enzymes: No results for  input(s): CKTOTAL, CKMB, CKMBINDEX, TROPONINI in the last 168 hours. BNP (last 3 results) No results for input(s): PROBNP in the last 8760 hours. HbA1C: No results for input(s): HGBA1C in the last 72 hours. CBG:  Recent Labs Lab 01/15/16 0007  GLUCAP 158*   Lipid Profile: No results for input(s): CHOL, HDL, LDLCALC, TRIG,  CHOLHDL, LDLDIRECT in the last 72 hours. Thyroid Function Tests: No results for input(s): TSH, T4TOTAL, FREET4, T3FREE, THYROIDAB in the last 72 hours. Anemia Panel: No results for input(s): VITAMINB12, FOLATE, FERRITIN, TIBC, IRON, RETICCTPCT in the last 72 hours. Urine analysis:    Component Value Date/Time   COLORURINE YELLOW 01/14/2016 2023   APPEARANCEUR CLOUDY* 01/14/2016 2023   LABSPEC 1.025 01/14/2016 2023   LABSPEC 1.015 04/06/2015 0939   PHURINE 6.0 01/14/2016 2023   GLUCOSEU NEGATIVE 01/14/2016 2023   HGBUR NEGATIVE 01/14/2016 2023   BILIRUBINUR MODERATE* 01/14/2016 2023   BILIRUBINUR 2+ 01/04/2016 1619   KETONESUR 40* 01/14/2016 2023   PROTEINUR NEGATIVE 01/14/2016 2023   PROTEINUR trace 01/04/2016 1619   UROBILINOGEN 1.0 01/04/2016 1619   UROBILINOGEN 0.2 04/06/2015 0939   UROBILINOGEN 1.0 12/09/2013 1655   NITRITE NEGATIVE 01/14/2016 2023   NITRITE neg 01/04/2016 1619   LEUKOCYTESUR SMALL* 01/14/2016 2023    Creatinine Clearance: Estimated Creatinine Clearance: 53.1 mL/min (by C-G formula based on Cr of 1.37).  Sepsis Labs: @LABRCNTIP (procalcitonin:4,lacticidven:4) )No results found for this or any previous visit (from the past 240 hour(s)).   Radiological Exams on Admission: Ct Angio Abdomen W/cm &/or Wo Contrast  01/13/2016  CLINICAL DATA:  Generalized abdominal pain for the past week. One episode of diarrhea last night. Her history of gastric cancer diagnosed in 2010. Hypertension. Diabetes. Hyperlipidemia. EXAM: CT ANGIOGRAPHY ABDOMEN TECHNIQUE: Multidetector CT imaging of the abdomen was performed using the standard protocol during bolus administration of intravenous contrast. Multiplanar reconstructed images including MIPs were obtained and reviewed to evaluate the vascular anatomy. CONTRAST:  80 cc of Isovue 370 COMPARISON:  Plain films of 1 day prior.  CT of 01/04/2016. FINDINGS: Lower chest: Clear lung bases. Tiny hiatal hernia. Mild cardiomegaly, without  pericardial or pleural effusion. Hepatobiliary: Marked hepatic steatosis with relatively mild hepatomegaly, 18.3 cm craniocaudal. Mild sparing in the posterior aspect of segment 4. Normal gallbladder, without biliary ductal dilatation. Pancreas: Moderate pancreatic atrophy, without duct dilatation or dominant mass. Spleen: Normal in size, without focal abnormality. Adrenals/Urinary Tract: Normal adrenal glands. Renal scarring, worse on the left. Decreased perfusion and function involving the upper pole left kidney. No hydronephrosis. Degraded evaluation of the pelvis, secondary to beam hardening artifact from right hip arthroplasty. Urinary bladder not well evaluated. Stomach/Bowel: Surgical changes of partial gastrectomy. The proximal stomach is underdistended. No dominant gastric mass identified. Suspect prior gastrojejunostomy. Scattered colonic diverticula. Muscular hypertrophy involving the sigmoid. Underdistention throughout the majority of the colon. Normal small bowel. Vascular/Lymphatic: Aorta is normal in caliber. Atherosclerosis. celiac is widely patent. The superior mesenteric artery is within normal limits. The inferior mesenteric artery is patent proximally. No significant stenosis involving the renal arteries. The pelvic vasculature is not well opacified, but no aneurysm is seen. No abdominal and no gross pelvic adenopathy. Reproductive: Normal imaged uterus. Other: No gross free pelvic fluid. Suspect prior ventral abdominal wall hernia repair, including on image 47/series 4. Musculoskeletal: Trace L4-5 anterolisthesis. Thoracolumbar spondylosis. Review of the MIP images confirms the above findings. IMPRESSION: 1. Atherosclerosis, without mesenteric stenosis or thrombosis. 2.  No acute process in the abdomen or pelvis. 3. Marked hepatic steatosis.  4. Small hiatal hernia. Partial gastrectomy with probable gastrojejunostomy. No evidence of metastatic disease. 5. Scarred kidneys, worse on the left. 6.  Degraded evaluation of the pelvis, secondary to beam hardening artifact from right hip arthroplasty. Electronically Signed   By: Abigail Miyamoto M.D.   On: 01/13/2016 21:22    EKG: None  Assessment/Plan Principal Problem:   Generalized abdominal pain Active Problems:   Stomach cancer (HCC)   Nausea   Uncontrolled type 2 diabetes mellitus with diabetic polyneuropathy, without long-term current use of insulin (HCC)   Chronic pain syndrome   Diarrhea    1. Abd pain - Patient with recurrent ER visits for the same problem.  Nonspecific pain, generally unremarkable evaluation.  However, patient does have h/o gastric cancer and recurrence always remains a concern.  Other differential includes infection (will order GI panel of stool and C. Diff); morphine withdrawal (has been unable to tolerate chronic pain meds due to persistent n/v); gastroparesis (unsual presentation); fatty liver (noted on CT, unusual to have such an acute presentation).  Will plan to admit to observation status and encourage day team to request GI consultation in AM for possible EGD (and colonoscopy?).  She is NPO for now and receiving IVF.  2. DM - Poor control by last A1c and so uncontrolled DM may also be a source of abdominal pain.  Will initiate Lantus and SSI and follow.  She is on several oral agents and may need consideration of Lantus based on poor control with current regimen.  Nutrition consult placed, as this would also likely be beneficial for patient.  3. H/o gastric cancer - would suggest onc consultation as patient reports good relationship with Dr. Marin Olp and he may have additional input/suggestions.  4. Chronic pain syndrome - Neuropathic pain in hands and feet, generally controlled/improved with short- and long-term morphine.  Has been unable to tolerate due to persistent n/v.  Will provide IV morphine prn q2h for now.   DVT prophylaxis: Lovenox  Code Status: Full  Family Communication: family present  throughout H&P and in agreement with plan  Disposition Plan: home when clinically improved   Consults called: suggest GI and onc in AM; nutrition ordered  Admission status: observation    Karmen Bongo MD [facility]  If 7PM-7AM, please contact night-coverage [email or web address] Password [email or web address]  01/15/2016, 1:48 AM

## 2016-01-14 NOTE — Telephone Encounter (Signed)
Daughter calling office stating that patient is in continued abdominal pain. Patient was seen in the ED again yesterday and had a negative workup including CT angio of the abdomen and xrays. She was discharged from the ED.  Patient has spoken to the PCP and states that they will place a consult to GI but that the wait is too much for her mom. She wants to know if Dr Marin Olp will admit patient to the hospital.  Spoke to Dr Marin Olp. He reviewed all ED notes and results. He is unable at this time to admit patient as she doesn't have any symptoms related to her cancer history. He is concerned about her pain and discomfort, but he states this is a PCP matter.  Spoke to [place] and she understands the doctor's message. Patient's chart was reviewed and it appears that she has been seen by Dr Michail Sermon. It was was recommended that she call that office to see if patient was still considered 'active' and if they would provide any advice or assistance. She stated she would call.

## 2016-01-14 NOTE — Telephone Encounter (Signed)
[name] this is a pt of [facility] she has procedures from 2017 with Dr Michail Sermon she can try and get in sooner with [facility].

## 2016-01-14 NOTE — Telephone Encounter (Signed)
Patient Name: [facility]  DOB: 05/19/1951    Initial Comment Caller states she's having severe abdominal pain. Was seen in the ER- They couldn't find anything, but may be a ulcer. She was in the ER on Tuesday and Wed.She had stomach cancer a couple of years ago, Cat Scan was clear on the cancer.   Nurse Assessment  Nurse: Raphael Gibney, RN, Vanita Ingles Date/Time (Eastern Time): 01/14/2016 11:42:47 AM  Confirm and document reason for call. If symptomatic, describe symptoms. You must click the next button to save text entered. ---Caller states mother is in severe abd pain. She is doubled over in pain. CT scan was negative but they thought she might have an ulcer. she is not with her mother. She has been to ER on Tuesday and Wednesday and was sent home. Daughter has called the oncologist who thought she might have bowel ischemia and thought she needed to be admitted to the hospital. Daughter states pt can not talk on the phone due to pain. Can not eat. Has vomited off and on.  Has the patient traveled out of the country within the last 30 days? ---Not Applicable  Does the patient have any new or worsening symptoms? ---Yes  Will a triage be completed? ---No  Select reason for no triage. ---Other  Please document clinical information provided and list any resource used. ---advised daughter that pt needs to go back to the ER because of severe abd pain but pt can not be called as she can not talk on the phone due to the pain. her oncologist told the daughter that pt needs to be admitted to the hospital. Advised caller that someone in Dr. Nonda Lou office will be notified regarding pain and someone will call her back. Phone numbers: [phone] secondary number: [phone]     Guidelines    Guideline Title Affirmed Question Affirmed Notes       Final Disposition User   Clinical Call Raphael Gibney, RN, Vera    Comments  Called Dr. Nonda Lou office and spoke to [place] and gave her report that pt is having severe abd pain  and has been to the ER Tuesday and wednesday and was sent home. She was not ordered anything for pain. Oncologist thinks she needs to be admitted to the hospital. Langley Gauss states that [name] is working on a GI referral and [name] can not admit to the hospital so pt would have to go back to the ER. Daughter notified and verbalized understanding.

## 2016-01-14 NOTE — ED Notes (Signed)
Pt states pain is getting better; pain level is at a 6 out of 10 on the 0 to 10 pain scale

## 2016-01-14 NOTE — ED Notes (Signed)
Pt daughter stated her mom is to dehydrated to give a urine sample. Pt is aware one is needed.

## 2016-01-14 NOTE — Telephone Encounter (Signed)
-----   Message from Irven Baltimore sent at 01/14/2016  1:08 PM EDT ----- Chong Sicilian,   Please advise as to urgency of scheduling. Dr. Ardis Hughs patient.  ----- Message -----    From: Synthia Innocent    Sent: 01/14/2016  11:57 AM      To: Irven Baltimore  Good Morning! Dr Etter Sjogren is requesting this patient be seen soon. Is scheduled to see Dr Ardis Hughs in July. Is this possible?

## 2016-01-14 NOTE — Telephone Encounter (Signed)
Duplicate request: reference [facility] telephone note: 01/14/16

## 2016-01-14 NOTE — Telephone Encounter (Signed)
Kim please see below, does Dr Etter Sjogren want me to send patient back to Dr Michail Sermon?

## 2016-01-14 NOTE — Telephone Encounter (Signed)
Please advise      KP 

## 2016-01-14 NOTE — ED Notes (Addendum)
Per EMS, Pt, from home, c/o abdominal pain x 8 days.  Pain score 10/10.  Pt was seen at [facility] x 2 days ago and [facility] x 1 day ago for same.  Pt reports that she has not taken her prescribed medication.  Pt sts she attempted to make an appointment w/ GI referral, but cannot be seen until August.   Pt reports that she has not taken prescribed medications and sts "I didn't know which ones to take."

## 2016-01-14 NOTE — ED Provider Notes (Signed)
CSN: [id number]     Arrival date & time 01/14/16  1407 History   First MD Initiated Contact with Patient 01/14/16 1845     Chief Complaint  Patient presents with  . Abdominal Pain     (Consider location/radiation/quality/duration/timing/severity/associated sxs/prior Treatment) Patient is a 65 y.o. female presenting with abdominal pain. The history is provided by the patient.  Abdominal Pain She has been complaining of generalized abdominal pain for at least a week. Pain is worse after eating. Also, after eating, she has nausea and vomiting. Pain does not improve after vomiting. She's been having diarrhea for about the last week. Pain is not improved after having a bowel movement. She denies fever or chills. There has been no dysuria and, in fact, she has had decreased urine output today. She had been seen in emergency and her doctor's office and was referred to gastroenterology, but is unable to get an appointment until mid-August.  Past Medical History  Diagnosis Date  . stomach ca dx'd 2010    chemo/xrt comp 05/2009  . Hypertension   . Diabetes mellitus   . Asthma   . Sleep apnea   . Degenerative joint disease   . Hyperlipidemia   . GERD (gastroesophageal reflux disease)   . Neuropathy ([place])     bilateral hands and feet   Past Surgical History  Procedure Laterality Date  . Total knee arthroplasty      Left  . Total hip arthroplasty      Right  . Shoulder surgery      Left  . Appendectomy    . Cesarean section    . Hernia repair    . Colon surgery    . Esophagogastroduodenoscopy N/A 10/15/2012    Procedure: ESOPHAGOGASTRODUODENOSCOPY (EGD);  Surgeon: Lear Ng, MD;  Location: [facility];  Service: Endoscopy;  Laterality: N/A;   Family History  Problem Relation Age of Onset  . Cancer Mother   . Alzheimer's disease Father   . Diabetes Brother   . Diabetes Maternal Grandmother   . Cancer Maternal Grandfather     lung  . Suicidality Neg Hx   . Depression  Neg Hx   . Dementia Neg Hx   . Anxiety disorder Neg Hx    Social History  Substance Use Topics  . Smoking status: Former Smoker -- 1.00 packs/day for 15 years    Types: Cigarettes    Start date: 09/27/1973    Quit date: 10/15/1988  . Smokeless tobacco: Never Used     Comment: quit smoking 14 years ago  . Alcohol Use: No   OB History    Gravida Para Term Preterm AB TAB SAB Ectopic Multiple Living   4 4        4      Review of Systems  Gastrointestinal: Positive for abdominal pain.  All other systems reviewed and are negative.     Allergies  Adhesive; Codeine; Zocor; and Penicillins  Home Medications   Prior to Admission medications   Medication Sig Start Date End Date Taking? Authorizing Provider  ALPRAZolam Duanne Moron) 0.5 MG tablet Take 1 tablet 30 minutes prior to your procedure and take the bottle with you to your MRI Patient not taking: Reported on 01/13/2016 11/02/15   Alferd Apa Lowne Chase, DO  aspirin 81 MG tablet Take 81 mg by mouth at bedtime.    Historical Provider, MD  azelastine (ASTELIN) 0.1 % nasal spray Place 1 spray into both nostrils 2 (two) times daily. Use in each  nostril as directed Patient not taking: Reported on 01/13/2016 01/13/15   Rosalita Chessman Chase, DO  azithromycin (ZITHROMAX) 250 MG tablet Take per package instructions. Patient not taking: Reported on 01/13/2016 01/07/16   Rosalita Chessman Chase, DO  carvedilol (COREG) 12.5 MG tablet Take 12.5 mg by mouth every morning. 07/20/15   Historical Provider, MD  carvedilol (COREG) 12.5 MG tablet TAKE 1 TABLET BY MOUTH BY EVERY MORNING 10/16/15   Alferd Apa Lowne Chase, DO  DULoxetine (CYMBALTA) 60 MG capsule Take 1 capsule (60 mg total) by mouth 2 (two) times daily. 06/09/15   Yvonne R Lowne Chase, DO  EMBEDA 30-1.2 MG CPCR TK ONE C PO ONCE D 12/24/15   Historical Provider, MD  Gabapentin Enacarbil (HORIZANT) 600 MG TBCR Take 1 tablet by mouth 2 (two) times daily.    Historical Provider, MD  glimepiride (AMARYL) 4 MG  tablet TAKE 1 TABLET BY MOUTH TWICE A DAY 09/17/15   Yvonne R Lowne Chase, DO  hydrochlorothiazide (HYDRODIURIL) 25 MG tablet TAKE 1 TABLET (25 MG TOTAL) BY MOUTH DAILY. 10/16/15   Rosalita Chessman Chase, DO  lidocaine-prilocaine (EMLA) cream Apply 1 application topically as needed. 06/29/15   Volanda Napoleon, MD  LORazepam (ATIVAN) 0.5 MG tablet Take 1 tablet (0.5 mg total) by mouth at bedtime as needed for anxiety. 08/21/15   Alferd Apa Lowne Chase, DO  metaxalone (SKELAXIN) 800 MG tablet Take 1 tablet (800 mg total) by mouth 4 (four) times daily. Patient taking differently: Take 800 mg by mouth 4 (four) times daily as needed for muscle spasms.  04/06/15   Rosalita Chessman Chase, DO  metFORMIN (GLUCOPHAGE) 850 MG tablet Take 1 tablet (850 mg total) by mouth 2 (two) times daily. Repeat labs are due now 12/30/15   Philemon Kingdom, MD  montelukast (SINGULAIR) 10 MG tablet Take 1 tablet (10 mg total) by mouth at bedtime. Patient not taking: Reported on 01/13/2016 04/29/15   Rosalita Chessman Chase, DO  morphine (MSIR) 15 MG tablet TK 1 T PO TID PRN 12/18/15   Historical Provider, MD  moxifloxacin (VIGAMOX) 0.5 % ophthalmic solution Place 1 drop into both eyes 3 (three) times daily. Patient not taking: Reported on 01/13/2016 01/04/16   Ann Held, DO  Multiple Vitamin (MULTIVITAMIN WITH MINERALS) TABS tablet Take 1 tablet by mouth daily.    Historical Provider, MD  ondansetron (ZOFRAN ODT) 4 MG disintegrating tablet Take 1 tablet (4 mg total) by mouth every 8 (eight) hours as needed for nausea or vomiting. 01/13/16   Kristen N Ward, DO  ondansetron (ZOFRAN) 4 MG tablet Take 1 tablet (4 mg total) by mouth every 8 (eight) hours as needed for nausea or vomiting. Patient not taking: Reported on 01/13/2016 01/04/16   Alferd Apa Lowne Chase, DO  pantoprazole (PROTONIX) 40 MG tablet Take 1 tablet (40 mg total) by mouth daily. 11/17/15   Alferd Apa Lowne Chase, DO  pantoprazole (PROTONIX) 40 MG tablet Take 1 tablet (40 mg  total) by mouth daily. 01/13/16   Virgel Manifold, MD   BP 127/77 mmHg  Pulse 117  Temp(Src) 98.5 F (36.9 C) (Oral)  Resp 18  SpO2 98% Physical Exam  Nursing note and vitals reviewed.  65 year old female, resting comfortably and in no acute distress. Vital signs are significant for tachycardia. Oxygen saturation is 98%, which is normal. Head is normocephalic and atraumatic. PERRLA, EOMI. Oropharynx is clear. Neck is nontender and supple without adenopathy or JVD. Back  is nontender and there is no CVA tenderness. Lungs are clear without rales, wheezes, or rhonchi. Chest is nontender. Heart has regular rate and rhythm without murmur. Abdomen is soft, flat, with moderate tenderness in the right upper quadrant, left upper quadrant, left lower quadrant. There is no rebound or guarding. There are no masses or hepatosplenomegaly and peristalsis is hypoactive. Extremities have no cyanosis or edema, full range of motion is present. Skin is warm and dry without rash. Neurologic: Mental status is normal, cranial nerves are intact, there are no motor or sensory deficits.  ED Course  Procedures (including critical care time) Labs Review Results for orders placed or performed during the hospital encounter of 01/14/16  Lipase, blood  Result Value Ref Range   Lipase 14 11 - 51 U/L  Comprehensive metabolic panel  Result Value Ref Range   Sodium 140 135 - 145 mmol/L   Potassium 3.5 3.5 - 5.1 mmol/L   Chloride 104 101 - 111 mmol/L   CO2 23 22 - 32 mmol/L   Glucose, Bld 177 (H) 65 - 99 mg/dL   BUN 8 6 - 20 mg/dL   Creatinine, Ser 1.37 (H) 0.44 - 1.00 mg/dL   Calcium 8.3 (L) 8.9 - 10.3 mg/dL   Total Protein 6.0 (L) 6.5 - 8.1 g/dL   Albumin 2.6 (L) 3.5 - 5.0 g/dL   AST 58 (H) 15 - 41 U/L   ALT 46 14 - 54 U/L   Alkaline Phosphatase 72 38 - 126 U/L   Total Bilirubin 1.3 (H) 0.3 - 1.2 mg/dL   GFR calc non Af Amer 40 (L) >60 mL/min   GFR calc Af Amer 46 (L) >60 mL/min   Anion gap 13 5 - 15  CBC   Result Value Ref Range   WBC 9.4 4.0 - 10.5 K/uL   RBC 3.99 3.87 - 5.11 MIL/uL   Hemoglobin 12.0 12.0 - 15.0 g/dL   HCT 33.1 (L) 36.0 - 46.0 %   MCV 83.0 78.0 - 100.0 fL   MCH 30.1 26.0 - 34.0 pg   MCHC 36.3 (H) 30.0 - 36.0 g/dL   RDW 14.6 11.5 - 15.5 %   Platelets 298 150 - 400 K/uL  Urinalysis, Routine w reflex microscopic  Result Value Ref Range   Color, Urine YELLOW YELLOW   APPearance CLOUDY (A) CLEAR   Specific Gravity, Urine 1.025 1.005 - 1.030   pH 6.0 5.0 - 8.0   Glucose, UA NEGATIVE NEGATIVE mg/dL   Hgb urine dipstick NEGATIVE NEGATIVE   Bilirubin Urine MODERATE (A) NEGATIVE   Ketones, ur 40 (A) NEGATIVE mg/dL   Protein, ur NEGATIVE NEGATIVE mg/dL   Nitrite NEGATIVE NEGATIVE   Leukocytes, UA SMALL (A) NEGATIVE  Differential  Result Value Ref Range   Neutrophils Relative % 51 %   Neutro Abs 4.8 1.7 - 7.7 K/uL   Lymphocytes Relative 36 %   Lymphs Abs 3.4 0.7 - 4.0 K/uL   Monocytes Relative 11 %   Monocytes Absolute 1.1 (H) 0.1 - 1.0 K/uL   Eosinophils Relative 2 %   Eosinophils Absolute 0.2 0.0 - 0.7 K/uL   Basophils Relative 0 %   Basophils Absolute 0.0 0.0 - 0.1 K/uL  Urine microscopic-add on  Result Value Ref Range   Squamous Epithelial / LPF 6-30 (A) NONE SEEN   WBC, UA 0-5 0 - 5 WBC/hpf   RBC / HPF 0-5 0 - 5 RBC/hpf   Bacteria, UA FEW (A) NONE SEEN  I-Stat CG4 Lactic  Acid, ED  Result Value Ref Range   Lactic Acid, Venous 1.34 0.5 - 2.0 mmol/L   I have personally reviewed and evaluated these lab results as part of my medical decision-making.    MDM   Final diagnoses:  Generalized abdominal pain  Elevated transaminase level    Abdominal pain of uncertain cause. Old records are reviewed, and she had seen her PCP on June 12 at which time a CT of abdomen and pelvis was obtained which was unremarkable. She had been seen in the ED yesterday and 3 days ago. Yesterday she had a CT angiogram of the abdomen and pelvis which was unremarkable. Laboratory  workup has been unremarkable. Within the past 6 months, she has had negative ultrasound of the abdomen, negative HIDA scan, and negative MRI of the liver and gallbladder. At this point, I do not have any other imaging studies that are likely to be helpful. Labs will be repeated. She likely will need admission for management of intractable pain.  She had good relief of pain from intravenous morphine. Laboratory workup is unremarkable. She has mild renal insufficiency which is unchanged from baseline and mild elevation of AST which is unchanged from recent values. She has been taking morphine at home as part of chronic pain management and this had not been giving her relief. Case is discussed with Dr. Lorin Mercy of [facility] who agrees to admit the patient under observation status.  Delora Fuel, MD 81/84/03 [id number]

## 2016-01-14 NOTE — ED Notes (Addendum)
Lab delay - pt doesn't want to be stuck a lot.  Wants to wait for IV.  Already spoke to triage RN.

## 2016-01-14 NOTE — Telephone Encounter (Signed)
Relation to [id number]  Call back number:[phone]   Reason for call:  Patient daughter called to inform PCP she's currently in the ED being seen for stomach concerns. Daughter would like to know if PCP can make referral to gastro stat do to the next available being 03/11/16. Please advise

## 2016-01-14 NOTE — ED Notes (Signed)
Patient's family wants a GI consult

## 2016-01-14 NOTE — Telephone Encounter (Signed)
Anderson Malta,   Please see what Patty RN states. Thanks.

## 2016-01-14 NOTE — Telephone Encounter (Signed)
Relation to EN:IDPO  Call back number:[phone]   Reason for call:  Patient daughter called to inform PCP she's currently in the ED being seen for stomach concerns. Daughter would like to know if PCP can make referral to gastro stat do to the next available being 03/11/16. Please advise

## 2016-01-15 ENCOUNTER — Encounter (HOSPITAL_COMMUNITY)
Admission: EM | Disposition: A | Discharge status: Home / Self Care | Payer: Self-pay | Source: Home / Self Care | Attending: Family Medicine

## 2016-01-15 ENCOUNTER — Observation Stay (HOSPITAL_COMMUNITY): Payer: Medicare HMO

## 2016-01-15 ENCOUNTER — Encounter (HOSPITAL_COMMUNITY): Payer: Self-pay | Admitting: *Deleted

## 2016-01-15 DIAGNOSIS — G894 Chronic pain syndrome: Secondary | ICD-10-CM | POA: Diagnosis present

## 2016-01-15 DIAGNOSIS — R197 Diarrhea, unspecified: Secondary | ICD-10-CM | POA: Diagnosis present

## 2016-01-15 DIAGNOSIS — R1084 Generalized abdominal pain: Secondary | ICD-10-CM | POA: Diagnosis not present

## 2016-01-15 HISTORY — PX: ESOPHAGOGASTRODUODENOSCOPY: SHX5428

## 2016-01-15 LAB — BASIC METABOLIC PANEL
ANION GAP: 7 (ref 5–15)
BUN: 6 mg/dL (ref 6–20)
CHLORIDE: 109 mmol/L (ref 101–111)
CO2: 26 mmol/L (ref 22–32)
CREATININE: 1.15 mg/dL — AB (ref 0.44–1.00)
Calcium: 7.8 mg/dL — ABNORMAL LOW (ref 8.9–10.3)
GFR calc non Af Amer: 50 mL/min — ABNORMAL LOW (ref >60)
GFR, EST AFRICAN AMERICAN: 57 mL/min — AB (ref >60)
Glucose, Bld: 88 mg/dL (ref 65–99)
Potassium: 3.1 mmol/L — ABNORMAL LOW (ref 3.5–5.1)
SODIUM: 142 mmol/L (ref 135–145)

## 2016-01-15 LAB — CBC
HCT: 31.5 % — ABNORMAL LOW (ref 36.0–46.0)
HEMOGLOBIN: 11.1 g/dL — AB (ref 12.0–15.0)
MCH: 29.8 pg (ref 26.0–34.0)
MCHC: 35.2 g/dL (ref 30.0–36.0)
MCV: 84.5 fL (ref 78.0–100.0)
PLATELETS: 296 10*3/uL (ref 150–400)
RBC: 3.73 MIL/uL — AB (ref 3.87–5.11)
RDW: 15 % (ref 11.5–15.5)
WBC: 10.1 10*3/uL (ref 4.0–10.5)

## 2016-01-15 LAB — GLUCOSE, CAPILLARY
GLUCOSE-CAPILLARY: 130 mg/dL — AB (ref 65–99)
GLUCOSE-CAPILLARY: 158 mg/dL — AB (ref 65–99)
Glucose-Capillary: 92 mg/dL (ref 65–99)
Glucose-Capillary: 131 mg/dL — ABNORMAL HIGH (ref 65–99)
Glucose-Capillary: 110 mg/dL — ABNORMAL HIGH (ref 65–99)
Glucose-Capillary: 98 mg/dL (ref 65–99)

## 2016-01-15 LAB — PREALBUMIN: PREALBUMIN: 10.3 mg/dL — AB (ref 18–38)

## 2016-01-15 LAB — TSH: TSH: 4.112 u[IU]/mL (ref 0.350–4.500)

## 2016-01-15 SURGERY — EGD (ESOPHAGOGASTRODUODENOSCOPY)
Anesthesia: Moderate Sedation

## 2016-01-15 MED ORDER — SODIUM CHLORIDE 0.9% FLUSH
10.0000 mL | INTRAVENOUS | Status: DC | PRN
  Administered 2016-01-15: 20 mL

## 2016-01-15 MED ORDER — FENTANYL CITRATE (PF) 100 MCG/2ML IJ SOLN
INTRAMUSCULAR | Status: DC | PRN
  Administered 2016-01-15: 25 ug via INTRAVENOUS

## 2016-01-15 MED ORDER — SODIUM CHLORIDE 0.9 % IV SOLN: INTRAVENOUS | Status: DC

## 2016-01-15 MED ORDER — BUTAMBEN-TETRACAINE-BENZOCAINE 2-2-14 % EX AERO
INHALATION_SPRAY | CUTANEOUS | Status: DC | PRN
  Administered 2016-01-15: 1 via TOPICAL

## 2016-01-15 MED ORDER — MIDAZOLAM HCL 5 MG/ML IJ SOLN
INTRAMUSCULAR | Status: AC
  Filled 2016-01-15: qty 2

## 2016-01-15 MED ORDER — MIDAZOLAM HCL 10 MG/2ML IJ SOLN
INTRAMUSCULAR | Status: DC | PRN
  Administered 2016-01-15: 2 mg via INTRAVENOUS

## 2016-01-15 MED ORDER — FENTANYL CITRATE (PF) 100 MCG/2ML IJ SOLN
INTRAMUSCULAR | Status: AC
  Filled 2016-01-15: qty 2

## 2016-01-15 NOTE — Telephone Encounter (Signed)
Patient no longer wants to see Dr Michail Sermon. Please advise

## 2016-01-15 NOTE — Progress Notes (Signed)
01/15/16  1015  Per Radiology, patient will not be able to go for the scan today due to she was not prepped for scan. Patient needs to be NPO and no narcotic within 6hrs for scan to be done.

## 2016-01-15 NOTE — Progress Notes (Signed)
Pt placed on enteric precautions per orders and explained this to pt and the fact that we need to send a stool specimen to the lab.Pt verbalizes understanding.wbb

## 2016-01-15 NOTE — Progress Notes (Signed)
Endoscopy negative for source of pain, showing just normal postoperative changes.  We will order abdominal ultrasound, and start clear liquid diet thereafter.  Dr. Paulita Fujita will see patient for [place] tomorrow.  Cleotis Nipper, M.D. Pager [phone] If no answer or after 5 PM call [phone]

## 2016-01-15 NOTE — Care Management Obs Status (Signed)
[place] NOTIFICATION   Patient Details  Name: [name] MRN: [id number] Date of Birth: 24-Oct-1950   Medicare Observation Status Notification Given:  Yes    Lynnell Catalan, RN 01/15/2016, 3:36 PM

## 2016-01-15 NOTE — Telephone Encounter (Signed)
Please let the pt know that we have no sooner appt's and she should try and see Dr Michail Sermon, since she is an established pt they may be able to get her in sooner.

## 2016-01-15 NOTE — Consult Note (Signed)
Referring Provider:   Dr. Hillary Bow Primary Care Physician:  Ann Held, DO Primary Gastroenterologist:  Dr. Cannon Kettle  Reason for Consultation:  Abdominal pain  HPI: [name] is a 65 y.o. female status post surgery and chemotherapy for gastric cancer in 2010, who now presents with a roughly 1 week history of continuous upper abdominal nagging pain, in the left upper quadrant primarily, associated with some nausea but no vomiting, and significant anorexia. She's had chills, but no fevers. Her most recent endoscopy was in 2014 and was negative. The patient has been on pantoprazole prior to admission, and I don't believe there has been any exposure to ulcerogenic medications.  The patient was in the emergency room the night before last, and underwent a CT scan which was negative for any source of pain and in particular was negative for any evidence of recurrent gastric cancer.  Blood work is showing normal lipase level, normal white count, and stable chronic mild elevation of AST consistent with known fatty infiltration of the liver based on CT findings.    Past Medical History  Diagnosis Date  . stomach ca dx'd 2010    chemo/xrt comp 05/2009  . Hypertension     actually taking for DM renal protection, not DM  . Diabetes mellitus   . Asthma   . Sleep apnea   . Degenerative joint disease   . Hyperlipidemia     no longer on medication for this  . GERD (gastroesophageal reflux disease)   . Neuropathy ([place])     bilateral hands and feet  . Sickle cell trait [facility])     Past Surgical History  Procedure Laterality Date  . Total knee arthroplasty      Left  . Total hip arthroplasty      Right  . Shoulder surgery      Left  . Appendectomy    . Cesarean section    . Hernia repair    . Colon surgery    . Esophagogastroduodenoscopy N/A 10/15/2012    Procedure: ESOPHAGOGASTRODUODENOSCOPY (EGD);  Surgeon: Lear Ng, MD;  Location: [facility];  Service:  Endoscopy;  Laterality: N/A;    Prior to Admission medications   Medication Sig Start Date End Date Taking? Authorizing Provider  aspirin 81 MG tablet Take 81 mg by mouth at bedtime.   Yes Historical Provider, MD  carvedilol (COREG) 12.5 MG tablet TAKE 1 TABLET BY MOUTH BY EVERY MORNING 10/16/15  Yes Yvonne R Lowne Chase, DO  DULoxetine (CYMBALTA) 60 MG capsule Take 1 capsule (60 mg total) by mouth 2 (two) times daily. 06/09/15  Yes Yvonne R Lowne Chase, DO  EMBEDA 30-1.2 MG CPCR TK ONE C PO ONCE D 12/24/15  Yes Historical Provider, MD  glimepiride (AMARYL) 4 MG tablet TAKE 1 TABLET BY MOUTH TWICE A DAY 09/17/15  Yes Yvonne R Lowne Chase, DO  hydrochlorothiazide (HYDRODIURIL) 25 MG tablet TAKE 1 TABLET (25 MG TOTAL) BY MOUTH DAILY. 10/16/15  Yes Yvonne R Lowne Chase, DO  lidocaine-prilocaine (EMLA) cream Apply 1 application topically as needed. 06/29/15  Yes Volanda Napoleon, MD  LORazepam (ATIVAN) 0.5 MG tablet Take 1 tablet (0.5 mg total) by mouth at bedtime as needed for anxiety. 08/21/15  Yes Yvonne R Lowne Chase, DO  metaxalone (SKELAXIN) 800 MG tablet Take 1 tablet (800 mg total) by mouth 4 (four) times daily. Patient taking differently: Take 800 mg by mouth 4 (four) times daily as needed for muscle spasms.  04/06/15  Yes Rosalita Chessman Chase, DO  metFORMIN (GLUCOPHAGE) 850 MG tablet Take 1 tablet (850 mg total) by mouth 2 (two) times daily. Repeat labs are due now 12/30/15  Yes Philemon Kingdom, MD  morphine (MSIR) 15 MG tablet TK 1 T PO TID PRN 12/18/15  Yes Historical Provider, MD  Multiple Vitamin (MULTIVITAMIN WITH MINERALS) TABS tablet Take 1 tablet by mouth daily.   Yes Historical Provider, MD  ondansetron (ZOFRAN ODT) 4 MG disintegrating tablet Take 1 tablet (4 mg total) by mouth every 8 (eight) hours as needed for nausea or vomiting. 01/13/16  Yes Kristen N Ward, DO  pantoprazole (PROTONIX) 40 MG tablet Take 1 tablet (40 mg total) by mouth daily. 01/13/16  Yes Virgel Manifold, MD    Current  Facility-Administered Medications  Medication Dose Route Frequency Provider Last Rate Last Dose  . 0.9 %  sodium chloride infusion  1,000 mL Intravenous Continuous Delora Fuel, MD 315 mL/hr at 01/14/16 2029 1,000 mL at 01/14/16 2029  . acetaminophen (TYLENOL) tablet 650 mg  650 mg Oral Q6H PRN Karmen Bongo, MD       Or  . acetaminophen (TYLENOL) suppository 650 mg  650 mg Rectal Q6H PRN Karmen Bongo, MD      . antiseptic oral rinse (CPC / CETYLPYRIDINIUM CHLORIDE 0.05%) solution 7 mL  7 mL Mouth Rinse q12n4p Karmen Bongo, MD   7 mL at 01/15/16 1225  . aspirin EC tablet 81 mg  81 mg Oral QHS Karmen Bongo, MD   81 mg at 01/15/16 0140  . carvedilol (COREG) tablet 6.25 mg  6.25 mg Oral BID WC Karmen Bongo, MD   6.25 mg at 01/15/16 0840  . chlorhexidine (PERIDEX) 0.12 % solution 15 mL  15 mL Mouth Rinse BID Karmen Bongo, MD   15 mL at 01/15/16 0939  . DULoxetine (CYMBALTA) DR capsule 60 mg  60 mg Oral BID Karmen Bongo, MD   60 mg at 01/15/16 0939  . enoxaparin (LOVENOX) injection 40 mg  40 mg Subcutaneous QHS Karmen Bongo, MD   40 mg at 01/15/16 0140  . hydrochlorothiazide (HYDRODIURIL) tablet 25 mg  25 mg Oral Daily Karmen Bongo, MD   25 mg at 01/15/16 0939  . insulin aspart (novoLOG) injection 0-15 Units  0-15 Units Subcutaneous Q4H Karmen Bongo, MD   2 Units at 01/15/16 1223  . insulin glargine (LANTUS) injection 5 Units  5 Units Subcutaneous QHS Karmen Bongo, MD   5 Units at 01/15/16 0026  . lactated ringers 1,000 mL with potassium chloride 20 mEq infusion   Intravenous Continuous Karmen Bongo, MD 125 mL/hr at 01/15/16 [phone]    . metaxalone (SKELAXIN) tablet 800 mg  800 mg Oral QID PRN Karmen Bongo, MD      . morphine 4 MG/ML injection 4 mg  4 mg Intravenous Q2H PRN Karmen Bongo, MD   4 mg at 01/15/16 1222  . ondansetron (ZOFRAN) tablet 4 mg  4 mg Oral Q6H PRN Karmen Bongo, MD       Or  . ondansetron [facility]) injection 4 mg  4 mg Intravenous Q6H PRN Karmen Bongo, MD    4 mg at 01/15/16 1222  . pantoprazole (PROTONIX) EC tablet 40 mg  40 mg Oral Daily Karmen Bongo, MD   40 mg at 01/15/16 0939  . sodium chloride flush (NS) 0.9 % injection 10-40 mL  10-40 mL Intracatheter PRN Velvet Bathe, MD   20 mL at 01/15/16 0628    Allergies as of 01/14/2016 - Review  Complete 01/14/2016  Allergen Reaction Noted  . Adhesive [tape] Other (See Comments) 01/02/2015  . Codeine Other (See Comments)   . Zocor [simvastatin - high dose] Other (See Comments) 09/07/2011  . Penicillins Rash 01/03/2008    Family History  Problem Relation Age of Onset  . Cancer Mother 48  . Alzheimer's disease Father 59  . Diabetes Brother   . Diabetes Maternal Grandmother   . Cancer Maternal Grandfather     lung  . Suicidality Neg Hx   . Depression Neg Hx   . Dementia Neg Hx   . Anxiety disorder Neg Hx     Social History   Social History  . Marital Status: Divorced    Spouse Name: N/A  . Number of Children: N/A  . Years of Education: N/A   Occupational History  . Not on file.   Social History Main Topics  . Smoking status: Former Smoker -- 1.00 packs/day for 15 years    Types: Cigarettes    Start date: 09/27/1973    Quit date: 10/15/1988  . Smokeless tobacco: Never Used     Comment: quit smoking 14 years ago  . Alcohol Use: No  . Drug Use: No  . Sexual Activity: No   Other Topics Concern  . Not on file   Social History Narrative    Review of Systems:  Negative for fevers, skin rashes, arthralgias, chest pain, breathing difficulties, or urinary symptoms. Positive for anorexia as noted in the history of present illness.  Physical Exam: Vital signs in last 24 hours: Temp:  [97.5 F (36.4 C)-98.5 F (36.9 C)] 97.5 F (36.4 C) (06/23 0646) Pulse Rate:  [70-117] 75 (06/23 0837) Resp:  [16-20] 20 (06/23 0837) BP: (103-127)/(64-81) 122/66 mmHg (06/23 0837) SpO2:  [92 %-100 %] 99 % (06/23 0837) Weight:  [107.5 kg (236 lb 15.9 oz)] 107.5 kg (236 lb 15.9 oz) (06/22  2333) Last BM Date: 01/14/16 General:  Severely overweight. Alert,  Well-developed, well-nourished, pleasant and cooperative in mild intermittent distress from abdominal pain. Head:  Normocephalic and atraumatic. Eyes:  Sclera clear, no icterus.   Conjunctiva pink. Mouth:   No ulcerations or lesions.  Oropharynx pink & moist. Neck:   No masses or thyromegaly. Lungs:  Clear throughout to auscultation.   No wheezes, crackles, or rhonchi. No evident respiratory distress. Heart:   Regular rate and rhythm; no murmurs, clicks, rubs,  or gallops. Abdomen:  Obese. No hepatosplenomegaly. No guarding, mass effect, or consistent tenderness to palpation. Rectal:  Not performed, was heme-negative in the ER 3 nights ago  Msk:   Symmetrical without gross deformities. Extremities:   Without clubbing, cyanosis, or edema. Neurologic:  Alert and coherent;  grossly normal neurologically. Skin:  Intact without significant lesions or rashes. Cervical Nodes:  No significant cervical adenopathy. Psych:   Alert and cooperative. Normal mood and affect.  Intake/Output from previous day:   Intake/Output this shift:    Lab Results:  Recent Labs  01/13/16 1927 01/14/16 2009 01/15/16 0630  WBC 8.6 9.4 10.1  HGB 13.0 12.0 11.1*  HCT 37.3 33.1* 31.5*  PLT 388 298 296   BMET  Recent Labs  01/13/16 1940 01/14/16 2009 01/15/16 0630  NA 139 140 142  K 4.9 3.5 3.1*  CL 103 104 109  CO2 23 23 26   GLUCOSE 212* 177* 88  BUN 11 8 6   CREATININE 1.43* 1.37* 1.15*  CALCIUM 8.2* 8.3* 7.8*   LFT  Recent Labs  01/14/16 2009  PROT 6.0*  ALBUMIN 2.6*  AST 58*  ALT 46  ALKPHOS 72  BILITOT 1.3*   PT/INR No results for input(s): LABPROT, INR in the last 72 hours.  Studies/Results: Ct Angio Abdomen W/cm &/or Wo Contrast  01/13/2016  CLINICAL DATA:  Generalized abdominal pain for the past week. One episode of diarrhea last night. Her history of gastric cancer diagnosed in 2010. Hypertension. Diabetes.  Hyperlipidemia. EXAM: CT ANGIOGRAPHY ABDOMEN TECHNIQUE: Multidetector CT imaging of the abdomen was performed using the standard protocol during bolus administration of intravenous contrast. Multiplanar reconstructed images including MIPs were obtained and reviewed to evaluate the vascular anatomy. CONTRAST:  80 cc of Isovue 370 COMPARISON:  Plain films of 1 day prior.  CT of 01/04/2016. FINDINGS: Lower chest: Clear lung bases. Tiny hiatal hernia. Mild cardiomegaly, without pericardial or pleural effusion. Hepatobiliary: Marked hepatic steatosis with relatively mild hepatomegaly, 18.3 cm craniocaudal. Mild sparing in the posterior aspect of segment 4. Normal gallbladder, without biliary ductal dilatation. Pancreas: Moderate pancreatic atrophy, without duct dilatation or dominant mass. Spleen: Normal in size, without focal abnormality. Adrenals/Urinary Tract: Normal adrenal glands. Renal scarring, worse on the left. Decreased perfusion and function involving the upper pole left kidney. No hydronephrosis. Degraded evaluation of the pelvis, secondary to beam hardening artifact from right hip arthroplasty. Urinary bladder not well evaluated. Stomach/Bowel: Surgical changes of partial gastrectomy. The proximal stomach is underdistended. No dominant gastric mass identified. Suspect prior gastrojejunostomy. Scattered colonic diverticula. Muscular hypertrophy involving the sigmoid. Underdistention throughout the majority of the colon. Normal small bowel. Vascular/Lymphatic: Aorta is normal in caliber. Atherosclerosis. celiac is widely patent. The superior mesenteric artery is within normal limits. The inferior mesenteric artery is patent proximally. No significant stenosis involving the renal arteries. The pelvic vasculature is not well opacified, but no aneurysm is seen. No abdominal and no gross pelvic adenopathy. Reproductive: Normal imaged uterus. Other: No gross free pelvic fluid. Suspect prior ventral abdominal wall  hernia repair, including on image 47/series 4. Musculoskeletal: Trace L4-5 anterolisthesis. Thoracolumbar spondylosis. Review of the MIP images confirms the above findings. IMPRESSION: 1. Atherosclerosis, without mesenteric stenosis or thrombosis. 2.  No acute process in the abdomen or pelvis. 3. Marked hepatic steatosis. 4. Small hiatal hernia. Partial gastrectomy with probable gastrojejunostomy. No evidence of metastatic disease. 5. Scarred kidneys, worse on the left. 6. Degraded evaluation of the pelvis, secondary to beam hardening artifact from right hip arthroplasty. Electronically Signed   By: Abigail Miyamoto M.D.   On: 01/13/2016 21:22    Impression: Nonspecific abdominal pain. The anorexia and persistent character of the symptoms makes me worry about recurrent gastric neoplasia, although obviously no overt mass was seen on CT scan. Another thought would be biliary tract disease, keeping in mind that although the gallbladder was normal on her CT scan, most gallstones are noncalcified and might be missed on such a study.  Plan: Proceed to endoscopic evaluation this afternoon. Petra Kuba, purpose, and risks were reviewed with the patient who is familiar with it from prior examinations. Depending on those findings, an ultrasound of the gallbladder might the an appropriate next step.     Gibson V  01/15/2016, 12:34 PM   Pager [phone] If no answer or after 5 PM call [phone]

## 2016-01-15 NOTE — Telephone Encounter (Signed)
Patty,   I spoke to [place] at [facility]. She states that patient was last seen in [place] 11-2008. Last procedure was in 2014. Please advise if we can get patient in any sooner. Thanks.

## 2016-01-15 NOTE — Progress Notes (Signed)
Nursing Note: Called to look at [facility] raised area to r inner thigh.Pt says area a little sore.A; Will leave note to aks Md to see in am.Area is smaller than a penny.Pt says area a little tender.Pt had not noticed before.wbb

## 2016-01-15 NOTE — Telephone Encounter (Signed)
spoke with [name] and I made her aware we are trying to get her mother in sooner with GI and she verbalized understanding. [name] was admitted for the Abdominal pain.   KP

## 2016-01-15 NOTE — Progress Notes (Signed)
Nursing Note: Pt arrived via stretcher. Pt able to stand and get in bed.Pt is awake,alert and oriented x4.Complains of L abd pain and abd is tender to touch.Pt states she has not eaten in 4 days due to abd pain.Pt states she has had loose stools that are green in color.R chest port has IVF infusing.T-98.3 P-70 R-18 BP-127 81,PO2 92  On r/a.Will check cbg as pt is diabetic.Pt oriented to room and commuication board.MAE but c/o that her legs are weak."I just feel weak since I have not been eating".She states she has a h/o stomach cancer and says 2/3 of her stomach was removed in the past.Resting quietly in bed.wbb

## 2016-01-15 NOTE — Telephone Encounter (Signed)
Left message for patient to return my call.  Delsa Sale,  Please see Patty's message from below. Thanks.

## 2016-01-15 NOTE — Progress Notes (Signed)
Initial Nutrition Assessment  DOCUMENTATION CODES:   Obesity unspecified  INTERVENTION:  - Diet advancement as medically feasible. - RD will follow-up 6/26 and monitor for ability for diet advancement versus need for nutrition support.  NUTRITION DIAGNOSIS:   Inadequate oral intake related to inability to eat as evidenced by NPO status.  GOAL:   Patient will meet greater than or equal to 90% of their needs  MONITOR:   Diet advancement, Weight trends, Labs, Skin, I & O's  REASON FOR ASSESSMENT:   Consult Poor PO  ASSESSMENT:   65 y.o. female with medical history significant of DM, GERD, and stomach cancer in 2010 presening with abdominal pain. Pain has been going on for over 1 week. Has not eaten in 4-5 days. When pain started, had been eating chicken noodle soup constantly with crackers. Was seen by [facility] June 12 and they were concerned about unexplained weight loss - was 250 and now in 230 range over the last month; she did have TTP in LL[place] at that visit. Pain started acutely a few days later in the same location. Since onset, pain is constant. Feels like a nagging sensation with occasional bouts of more severe pain. Has intermittent vomiting. Stool looks dark green and loose. Denies recent antibiotics. +diarrhea - 3 stools today, small. +chills, no fevers. No sick contacts. No camping. Pain is now throughout lower abdomen. Able to keep water down but unable to keep food down (ate 1 cracker today and vomited).  Pt seen for consult. BMI indicates obesity. Pt has been NPO since admission and unable to meet needs; will monitor for diet advancement versus need for nutrition support pending EGD findings. Unable to talk with pt x2 attempted visits as pt out of the room for procedures.   All information obtained from notes in the chart and RD will follow-up 6/26. Pt with hx of gastric cancer s/p chemo and radiation. CT of abdomen and pelvis on 01/04/16 were unremarkable (no  cancerous findings at that time). Per RN note, pt had reported partial gastrectomy with 2/3 of stomach removed. Pt had indicated that she was experiencing N/V/D and severe abdominal pain for >/= 1 week PTA. Abdominal pain was worsened by eating and she would often have N/V after eating; will need to ask pt questions about this at follow-up to obtain further detail. Having a BM did not resolve abdominal pain. Per notes, pt had not been able to eat x4 days PTA due to above symptoms. Today would be day 5 without adequate PO intakes.   Unable to complete physical assessment and will do so at follow-up. Per chart review, pt has lost 14 lbs (4% body weight) in the past 1.5 months which is not significant for time frame. Unable to state malnutrition at this time.   Medications reviewed. IVF: NS @ 125 mL/hr. Labs reviewed; CBGs: 92-158 mg/dL today, K: 3.1 mmol/L, creatinine elevated but trending down, Ca: 7.8 mg/dL, AST elevated, GFR: 50 mL/min.     Diet Order:  Diet NPO time specified  Skin:  Reviewed, no issues  Last BM:  6/22  Height:   Ht Readings from Last 1 Encounters:  01/14/16 5' 7"  (1.702 m)    Weight:   Wt Readings from Last 1 Encounters:  01/14/16 236 lb 15.9 oz (107.5 kg)    Ideal Body Weight:  61.36 kg (kg)  BMI:  Body mass index is 37.11 kg/(m^2).  Estimated Nutritional Needs:   Kcal:  1700-1900  Protein:  85-95 grams  Fluid:  2 L/day  EDUCATION NEEDS:   No education needs identified at this time     Jarome Matin, MS, RD, LDN Inpatient Clinical Dietitian Pager # [phone] After hours/weekend pager # [phone]

## 2016-01-15 NOTE — Progress Notes (Signed)
PROGRESS NOTE    [name]  UDJ:[id number] DOB: 17-Sep-1950 DOA: 01/14/2016 PCP: Ann Held, DO    Brief Narrative:  65 y.o. female with medical history significant of DM, GERD, and stomach cancer in 2010 presening with abdominal pain. Pain has been going on for over 1 week. Has not eaten in 4-5 days. When pain started, had been eating chicken noodle soup constantly with crackers. Was seen by [facility] June 12 and they were concerned about unexplained weight loss.  Assessment & Plan:   1. Abd pain - - history reviewed and consulted GI this am. - Will continue supportive therapy  2. DM - - continue diabetic diet once able to eat - SSI   3. H/o gastric cancer  - Patient can follow-up with oncologist after hospital discharge unless GI workup reveals reason for patient to be evaluated sooner.  4. Chronic pain syndrome  - Continue current pain medication regimen  DVT prophylaxis: Lovenox Code Status: full Family Communication: None at bedside Disposition Plan: Pending improvement in condition   Consultants:   Gastroenterology   Procedures: pending   Antimicrobials: none   Subjective: Pt has no new complaints today.  Objective: Filed Vitals:   01/15/16 0646 01/15/16 0837 01/15/16 1245 01/15/16 1250  BP: 117/78 122/66  120/63  Pulse: 72 75 63   Temp: 97.5 F (36.4 C)  97.7 F (36.5 C)   TempSrc: Oral  Oral   Resp: 18 20 13    Height:      Weight:      SpO2: 95% 99% 99%     Intake/Output Summary (Last 24 hours) at 01/15/16 1401 Last data filed at 01/15/16 0923  Gross per 24 hour  Intake      0 ml  Output      0 ml  Net      0 ml   Filed Weights   01/14/16 2333  Weight: 107.5 kg (236 lb 15.9 oz)    Examination:  General exam: Appears calm and comfortable  Respiratory system: Clear to auscultation. Respiratory effort normal. Cardiovascular system: S1 & S2 heard, RRR.  Gastrointestinal system: Abdomen is nondistended, soft and nontender. No  organomegaly or masses felt.  Central nervous system: Alert and oriented. No focal neurological deficits. Extremities: Symmetric 5 x 5 power. Skin: No rashes, lesions or ulcers on limited exam Psychiatry:  Mood & affect appropriate.     Data Reviewed: I have personally reviewed following labs and imaging studies  CBC:  Recent Labs Lab 01/12/16 2300 01/13/16 1927 01/14/16 2009 01/15/16 0630  WBC 8.4 8.6 9.4 10.1  NEUTROABS  --  5.3 4.8  --   HGB 14.0 13.0 12.0 11.1*  HCT 39.9 37.3 33.1* 31.5*  MCV 84.4 85.0 83.0 84.5  PLT 361 388 298 588   Basic Metabolic Panel:  Recent Labs Lab 01/12/16 2300 01/13/16 1940 01/14/16 2009 01/15/16 0630  NA 139 139 140 142  K 3.9 4.9 3.5 3.1*  CL 100* 103 104 109  CO2 26 23 23 26   GLUCOSE 199* 212* 177* 88  BUN 13 11 8 6   CREATININE 1.35* 1.43* 1.37* 1.15*  CALCIUM 8.7* 8.2* 8.3* 7.8*   GFR: Estimated Creatinine Clearance: 63.2 mL/min (by C-G formula based on Cr of 1.15). Liver Function Tests:  Recent Labs Lab 01/12/16 2300 01/13/16 1940 01/14/16 2009  AST 53* 62* 58*  ALT 54 47 46  ALKPHOS 93 72 72  BILITOT 1.0 1.2 1.3*  PROT 6.8 5.9* 6.0*  ALBUMIN  2.8* 2.5* 2.6*    Recent Labs Lab 01/12/16 2300 01/13/16 1940 01/14/16 2009  LIPASE <10* 12 14   No results for input(s): AMMONIA in the last 168 hours. Coagulation Profile: No results for input(s): INR, PROTIME in the last 168 hours. Cardiac Enzymes: No results for input(s): CKTOTAL, CKMB, CKMBINDEX, TROPONINI in the last 168 hours. BNP (last 3 results) No results for input(s): PROBNP in the last 8760 hours. HbA1C: No results for input(s): HGBA1C in the last 72 hours. CBG:  Recent Labs Lab 01/15/16 0007 01/15/16 0406 01/15/16 0756 01/15/16 1144  GLUCAP 158* 130* 92 131*   Lipid Profile: No results for input(s): CHOL, HDL, LDLCALC, TRIG, CHOLHDL, LDLDIRECT in the last 72 hours. Thyroid Function Tests:  Recent Labs  01/15/16 0845  TSH 4.112   Anemia  Panel: No results for input(s): VITAMINB12, FOLATE, FERRITIN, TIBC, IRON, RETICCTPCT in the last 72 hours. Sepsis Labs:  Recent Labs Lab 01/13/16 1937 01/13/16 2124 01/14/16 2010  LATICACIDVEN 1.92 1.47 1.34    No results found for this or any previous visit (from the past 240 hour(s)).       Radiology Studies: Ct Angio Abdomen W/cm &/or Wo Contrast  01/13/2016  CLINICAL DATA:  Generalized abdominal pain for the past week. One episode of diarrhea last night. Her history of gastric cancer diagnosed in 2010. Hypertension. Diabetes. Hyperlipidemia. EXAM: CT ANGIOGRAPHY ABDOMEN TECHNIQUE: Multidetector CT imaging of the abdomen was performed using the standard protocol during bolus administration of intravenous contrast. Multiplanar reconstructed images including MIPs were obtained and reviewed to evaluate the vascular anatomy. CONTRAST:  80 cc of Isovue 370 COMPARISON:  Plain films of 1 day prior.  CT of 01/04/2016. FINDINGS: Lower chest: Clear lung bases. Tiny hiatal hernia. Mild cardiomegaly, without pericardial or pleural effusion. Hepatobiliary: Marked hepatic steatosis with relatively mild hepatomegaly, 18.3 cm craniocaudal. Mild sparing in the posterior aspect of segment 4. Normal gallbladder, without biliary ductal dilatation. Pancreas: Moderate pancreatic atrophy, without duct dilatation or dominant mass. Spleen: Normal in size, without focal abnormality. Adrenals/Urinary Tract: Normal adrenal glands. Renal scarring, worse on the left. Decreased perfusion and function involving the upper pole left kidney. No hydronephrosis. Degraded evaluation of the pelvis, secondary to beam hardening artifact from right hip arthroplasty. Urinary bladder not well evaluated. Stomach/Bowel: Surgical changes of partial gastrectomy. The proximal stomach is underdistended. No dominant gastric mass identified. Suspect prior gastrojejunostomy. Scattered colonic diverticula. Muscular hypertrophy involving the  sigmoid. Underdistention throughout the majority of the colon. Normal small bowel. Vascular/Lymphatic: Aorta is normal in caliber. Atherosclerosis. celiac is widely patent. The superior mesenteric artery is within normal limits. The inferior mesenteric artery is patent proximally. No significant stenosis involving the renal arteries. The pelvic vasculature is not well opacified, but no aneurysm is seen. No abdominal and no gross pelvic adenopathy. Reproductive: Normal imaged uterus. Other: No gross free pelvic fluid. Suspect prior ventral abdominal wall hernia repair, including on image 47/series 4. Musculoskeletal: Trace L4-5 anterolisthesis. Thoracolumbar spondylosis. Review of the MIP images confirms the above findings. IMPRESSION: 1. Atherosclerosis, without mesenteric stenosis or thrombosis. 2.  No acute process in the abdomen or pelvis. 3. Marked hepatic steatosis. 4. Small hiatal hernia. Partial gastrectomy with probable gastrojejunostomy. No evidence of metastatic disease. 5. Scarred kidneys, worse on the left. 6. Degraded evaluation of the pelvis, secondary to beam hardening artifact from right hip arthroplasty. Electronically Signed   By: Abigail Miyamoto M.D.   On: 01/13/2016 21:22        Scheduled Meds: . [  MAR Hold] antiseptic oral rinse  7 mL Mouth Rinse q12n4p  . [MAR Hold] aspirin EC  81 mg Oral QHS  . [MAR Hold] carvedilol  6.25 mg Oral BID WC  . [MAR Hold] chlorhexidine  15 mL Mouth Rinse BID  . [MAR Hold] DULoxetine  60 mg Oral BID  . [MAR Hold] enoxaparin (LOVENOX) injection  40 mg Subcutaneous QHS  . [MAR Hold] hydrochlorothiazide  25 mg Oral Daily  . [MAR Hold] insulin aspart  0-15 Units Subcutaneous Q4H  . [MAR Hold] insulin glargine  5 Units Subcutaneous QHS  . [MAR Hold] pantoprazole  40 mg Oral Daily   Continuous Infusions: . sodium chloride 1,000 mL (01/14/16 2029)  . sodium chloride    . lactated ringers with kcl 125 mL/hr at 01/15/16 0939    Time spent: > 35  minutes  Velvet Bathe, MD [facility] Pager [phone]  If 7PM-7AM, please contact night-coverage [email or web address] Password [email or web address] 01/15/2016, 2:01 PM

## 2016-01-15 NOTE — Op Note (Signed)
[facility] Patient Name: [name] Procedure Date: 01/15/2016 MRN: [id number] Attending MD: Ronald Lobo , MD Date of Birth: 07/19/1951 CSN: [id number] Age: 65 Admit Type: Inpatient Procedure:                Upper GI endoscopy Indications:              Epigastric abdominal pain, Abdominal pain in the                            left upper quadrant in a patient 7 years s/p                            gastrectomy and chemotherapy for gastric cancer. Providers:                Ronald Lobo, MD, Laverta Baltimore, RN, Zenon Mayo, RN, [facility], Technician Referring MD:              Medicines:                Fentanyl 25 micrograms IV, Midazolam 2 mg IV Complications:            No immediate complications. Estimated Blood Loss:     Estimated blood loss: none. Procedure:                Pre-Anesthesia Assessment:                           - Prior to the procedure, a History and Physical                            was performed, and patient medications and                            allergies were reviewed. The patient's tolerance of                            previous anesthesia was also reviewed. The risks                            and benefits of the procedure and the sedation                            options and risks were discussed with the patient.                            All questions were answered, and informed consent                            was obtained. Prior Anticoagulants: The patient has                            taken no previous anticoagulant or antiplatelet  agents. ASA Grade Assessment: III - A patient with                            severe systemic disease. After reviewing the risks                            and benefits, the patient was deemed in                            satisfactory condition to undergo the procedure.                           After obtaining informed consent, the  endoscope was                            passed under direct vision. Throughout the                            procedure, the patient's blood pressure, pulse, and                            oxygen saturations were monitored continuously. The                            [id number]-[id number] ([id number]) scope was introduced through the                            mouth, and advanced to the afferent and efferent                            jejunal loops. The upper GI endoscopy was                            accomplished without difficulty. The patient                            tolerated the procedure well. Scope In: Scope Out: Findings:      The examined esophagus was normal.      A 1 cm hiatal hernia was present.      Evidence of a distal antrectomy with patent Billroth II       gastrojejunostomy was found. The gastrojejunal anastomosis was       characterized by healthy appearing mucosa. This was traversed. The       efferent limb was examined 6 cm from anastomosis and was characterized       by healthy appearing mucosa. The afferent limb was examined 6 cm from       anastomosis and was characterized by healthy appearing mucosa.      The cardia and gastric fundus were normal on retroflexion.      The examined jejunum was normal. Impression:               - Normal esophagus.                           - 1 cm hiatal  hernia.                           - Patent Billroth II gastrojejunostomy was found,                            characterized by healthy appearing mucosa.                           - Normal examined jejunum.                           - No specimens collected.                           - No evidence of intra-luminal recurrence of                            gastric cancer.                           - No source of patient's upper abdominal pain                            endoscopically evident. Moderate Sedation:      Moderate (conscious) sedation was administered by the endoscopy nurse        and supervised by the endoscopist. The following parameters were       monitored: oxygen saturation, heart rate, blood pressure, and response       to care. Total physician intraservice time was 7 minutes. Recommendation:           - Continue present medications.                           - Abdominal ultrasound to check for gallstones.                           - NPO today. Procedure Code(s):        --- Professional ---                           [phone], Esophagogastroduodenoscopy, flexible,                            transoral; diagnostic, including collection of                            specimen(s) by brushing or washing, when performed                            (separate procedure) Diagnosis Code(s):        --- Professional ---                           R10.13, Epigastric pain CPT copyright 2016 [employer] Medical Association. All rights reserved. The codes documented in this report are preliminary and upon coder review may  be revised to meet current compliance requirements. Ronald Lobo, MD  01/15/2016 2:42:10 PM This report has been signed electronically. Number of Addenda: 0

## 2016-01-16 DIAGNOSIS — R1084 Generalized abdominal pain: Secondary | ICD-10-CM | POA: Diagnosis not present

## 2016-01-16 LAB — GLUCOSE, CAPILLARY
GLUCOSE-CAPILLARY: 163 mg/dL — AB (ref 65–99)
GLUCOSE-CAPILLARY: 243 mg/dL — AB (ref 65–99)
GLUCOSE-CAPILLARY: 55 mg/dL — AB (ref 65–99)
GLUCOSE-CAPILLARY: 79 mg/dL (ref 65–99)
GLUCOSE-CAPILLARY: 93 mg/dL (ref 65–99)
Glucose-Capillary: 142 mg/dL — ABNORMAL HIGH (ref 65–99)
Glucose-Capillary: 171 mg/dL — ABNORMAL HIGH (ref 65–99)

## 2016-01-16 MED ORDER — POLYVINYL ALCOHOL 1.4 % OP SOLN
1.0000 [drp] | OPHTHALMIC | Status: DC | PRN
  Filled 2016-01-16: qty 15

## 2016-01-16 MED ORDER — LIDOCAINE HCL 1 % IJ SOLN
20.0000 mL | Freq: Once | INTRAMUSCULAR | Status: AC
  Administered 2016-01-16: 20 mL
  Filled 2016-01-16: qty 20

## 2016-01-16 MED ORDER — HYOSCYAMINE SULFATE 0.125 MG SL SUBL
0.1250 mg | SUBLINGUAL_TABLET | Freq: Four times a day (QID) | SUBLINGUAL | Status: DC | PRN
  Administered 2016-01-16 – 2016-01-17 (×2): 0.125 mg via SUBLINGUAL
  Filled 2016-01-16 (×3): qty 1

## 2016-01-16 MED ORDER — SULFAMETHOXAZOLE-TRIMETHOPRIM 800-160 MG PO TABS
1.0000 | ORAL_TABLET | Freq: Two times a day (BID) | ORAL | Status: DC
  Administered 2016-01-16 – 2016-01-20 (×8): 1 via ORAL
  Filled 2016-01-16 (×8): qty 1

## 2016-01-16 NOTE — Consult Note (Signed)
Expand All Collapse All   Reason for Consult: abscess Referring Physician: Demika Langenderfer is an 65 y.o. female.  HPI: 65 yo female With h/o gastric cancer s/p gastrectomy with Billroth II reconstruction admitted for diarrhea of unknown origin on further examination found to have right thigh abscess. She has not had previous abscesses. She has a history of diabetes with sugars usually below 200 on 2 oral agents. She felt the abscess lastnight and it has been painful since.  Past Medical History  Diagnosis Date  . stomach ca dx'd 2010    chemo/xrt comp 05/2009  . Hypertension     actually taking for DM renal protection, not DM  . Diabetes mellitus   . Asthma   . Sleep apnea   . Degenerative joint disease   . Hyperlipidemia     no longer on medication for this  . GERD (gastroesophageal reflux disease)   . Neuropathy ([place])     bilateral hands and feet  . Sickle cell trait [facility])     Past Surgical History  Procedure Laterality Date  . Total knee arthroplasty      Left  . Total hip arthroplasty      Right  . Shoulder surgery      Left  . Appendectomy    . Cesarean section    . Hernia repair    . Colon surgery    . Esophagogastroduodenoscopy N/A 10/15/2012    Procedure: ESOPHAGOGASTRODUODENOSCOPY (EGD); Surgeon: Lear Ng, MD; Location: [facility]; Service: Endoscopy; Laterality: N/A;    Family History  Problem Relation Age of Onset  . Cancer Mother 71  . Alzheimer's disease Father 34  . Diabetes Brother   . Diabetes Maternal Grandmother   . Cancer Maternal Grandfather     lung  . Suicidality Neg Hx   . Depression Neg Hx   . Dementia Neg Hx   . Anxiety disorder Neg Hx     Social History:  reports that she quit smoking about 27 years ago. Her smoking use included Cigarettes. She started smoking about 42 years  ago. She has a 15 pack-year smoking history. She has never used smokeless tobacco. She reports that she does not drink alcohol or use illicit drugs.  Allergies:  Allergies  Allergen Reactions  . Adhesive [Tape] Other (See Comments)    Burn Skin  . Codeine Other (See Comments)    paranoid  . Zocor [Simvastatin - High Dose] Other (See Comments)    Muscle spasms  . Penicillins Rash    Has patient had a PCN reaction causing immediate rash, facial/tongue/throat swelling, SOB or lightheadedness with hypotension: Yes Has patient had a PCN reaction causing severe rash involving mucus membranes or skin necrosis: No Has patient had a PCN reaction that required hospitalization does not remember.  Has patient had a PCN reaction occurring within the last 10 years: No If all of the above answers are "NO", then may proceed with Cephalosporin use.     Medications: I have reviewed the patient's current medications.   Lab Results Last 48 Hours    No results found for this or any previous visit (from the past 48 hour(s)).     Imaging Results (Last 48 hours)    US Abdomen Complete  01/15/2016 CLINICAL DATA: Abdominal pain x2 weeks. Stomach carcinoma. EXAM: COMPLETE ABDOMINAL ULTRASOUND COMPARISON: CT 01/13/2016 FINDINGS: Gallbladder: Physiologically distended without stones, wall thickening, or pericholecystic fluid. Sonographer reports no sonographic Murphy's sign. Common bile duct: Normal in  caliber, 5.65m diameter. Liver: Coarse echotexture without focal lesion or intrahepatic biliary ductal dilatation. IVC: Negative Pancreas: Visualized segments unremarkable, portions obscured by overlying bowel gas. Spleen: No focal lesion, craniocaudal 3.0cm in length. Right Kidney: No mass or hydronephrosis, 11.6cm in length. Left Kidney: No lesion or hydronephrosis, 10.1cm in length. Abdominal aorta: Negative IMPRESSION: Negative. Normal gallbladder. Electronically Signed By:  DLucrezia EuropeM.D. On: 01/15/2016 17:06     Review of Systems  Constitutional: Negative for fever and chills.  HENT: Negative for hearing loss.  Eyes: Negative for blurred vision and double vision.  Respiratory: Negative for cough and hemoptysis.  Cardiovascular: Negative for chest pain and palpitations.  Gastrointestinal: Positive for diarrhea. Negative for nausea, vomiting and abdominal pain.  Genitourinary: Negative for dysuria and urgency.  Musculoskeletal: Negative for myalgias and neck pain.  Skin: Negative for itching and rash.  Neurological: Negative for dizziness, tingling and headaches.  Endo/Heme/Allergies: Does not bruise/bleed easily.  Psychiatric/Behavioral: Negative for depression and suicidal ideas.   Blood pressure 102/78, pulse 86, temperature 98.7 F (37.1 C), temperature source Oral, resp. rate 19, height 5' 7"  (1.702 m), weight 105.688 kg (233 lb), SpO2 100 %. Physical Exam  Vitals reviewed. Constitutional: She is oriented to person, place, and time. She appears well-developed and well-nourished.  HENT:  Head: Normocephalic and atraumatic.  Eyes: Conjunctivae and EOM are normal. Pupils are equal, round, and reactive to light.  Neck: Normal range of motion. Neck supple.  Cardiovascular: Normal rate and regular rhythm.  Respiratory: Effort normal and breath sounds normal.  GI: Soft. Bowel sounds are normal. She exhibits no distension. There is no tenderness.  Midline abdominal scar  Musculoskeletal: Normal range of motion.  Neurological: She is alert and oriented to person, place, and time.  Skin: Skin is warm and dry.  Right proximal medial thigh with 2cm raised abscess  Psychiatric: She has a normal mood and affect. Her behavior is normal.    Assessment/Plan: 65yo female with DM, history of gastric cancer s/p billroth II, with right proximal thigh abscess. -I+D at bedside  [place]6/24/2017, 5:20 PM

## 2016-01-16 NOTE — Progress Notes (Signed)
PROGRESS NOTE    [name]  TMA:[id number] DOB: 22-Aug-1950 DOA: 01/14/2016 PCP: Ann Held, DO    Brief Narrative:  65 y.o. female with medical history significant of DM, GERD, and stomach cancer in 2010 presening with abdominal pain. Pain has been going on for over 1 week. Has not eaten in 4-5 days. When pain started, had been eating chicken noodle soup constantly with crackers. Was seen by [facility] June 12 and they were concerned about unexplained weight loss.  Assessment & Plan:   1. Abd pain - - endoscopic evaluation did not reveal any source. Patient reports recent diarrhea. Gi to try antispasmodics - Will continue supportive therapy   2. DM - - continue diabetic diet once able to eat - SSI   3. H/o gastric cancer  - Patient can follow-up with oncologist after hospital discharge unless GI workup reveals reason for patient to be evaluated sooner.  4. Chronic pain syndrome  - Continue current pain medication regimen  5. Thigh abscess - will consult General surgery to assess - Place on bactrim.  DVT prophylaxis: Lovenox Code Status: full Family Communication: None at bedside Disposition Plan: Pending improvement in condition   Consultants:   Gastroenterology   Procedures: pending   Antimicrobials: none   Subjective: Pt complaining of some discomfort at right thigh near groin.  Objective: Filed Vitals:   01/15/16 1430 01/15/16 2121 01/16/16 0625 01/16/16 1353  BP:  120/74 124/77 125/77  Pulse: 86 65 72 68  Temp:  98.3 F (36.8 C) 98.3 F (36.8 C) 98.3 F (36.8 C)  TempSrc:  Oral Oral Oral  Resp: 20 18 19 16   Height:      Weight:   107.276 kg (236 lb 8 oz)   SpO2: 98% 92% 95% 98%    Intake/Output Summary (Last 24 hours) at 01/16/16 1534 Last data filed at 01/16/16 0757  Gross per 24 hour  Intake   1875 ml  Output      0 ml  Net   1875 ml   Filed Weights   01/14/16 2333 01/16/16 0625  Weight: 107.5 kg (236 lb 15.9 oz) 107.276 kg  (236 lb 8 oz)    Examination:  General exam: Appears calm and comfortable  Respiratory system: Clear to auscultation. Respiratory effort normal. Cardiovascular system: S1 & S2 heard, RRR.  Gastrointestinal system: Abdomen is nondistended, soft and nontender. No organomegaly or masses felt.  Central nervous system: Alert and oriented. No focal neurological deficits. Extremities: Symmetric 5 x 5 power. Skin: right 1 cm abscess at right thigh anterior aspect near groin Psychiatry:  Mood & affect appropriate.     Data Reviewed: I have personally reviewed following labs and imaging studies  CBC:  Recent Labs Lab 01/12/16 2300 01/13/16 1927 01/14/16 2009 01/15/16 0630  WBC 8.4 8.6 9.4 10.1  NEUTROABS  --  5.3 4.8  --   HGB 14.0 13.0 12.0 11.1*  HCT 39.9 37.3 33.1* 31.5*  MCV 84.4 85.0 83.0 84.5  PLT 361 388 298 256   Basic Metabolic Panel:  Recent Labs Lab 01/12/16 2300 01/13/16 1940 01/14/16 2009 01/15/16 0630  NA 139 139 140 142  K 3.9 4.9 3.5 3.1*  CL 100* 103 104 109  CO2 26 23 23 26   GLUCOSE 199* 212* 177* 88  BUN 13 11 8 6   CREATININE 1.35* 1.43* 1.37* 1.15*  CALCIUM 8.7* 8.2* 8.3* 7.8*   GFR: Estimated Creatinine Clearance: 63.2 mL/min (by C-G formula based on Cr of  1.15). Liver Function Tests:  Recent Labs Lab 01/12/16 2300 01/13/16 1940 01/14/16 2009  AST 53* 62* 58*  ALT 54 47 46  ALKPHOS 93 72 72  BILITOT 1.0 1.2 1.3*  PROT 6.8 5.9* 6.0*  ALBUMIN 2.8* 2.5* 2.6*    Recent Labs Lab 01/12/16 2300 01/13/16 1940 01/14/16 2009  LIPASE <10* 12 14   No results for input(s): AMMONIA in the last 168 hours. Coagulation Profile: No results for input(s): INR, PROTIME in the last 168 hours. Cardiac Enzymes: No results for input(s): CKTOTAL, CKMB, CKMBINDEX, TROPONINI in the last 168 hours. BNP (last 3 results) No results for input(s): PROBNP in the last 8760 hours. HbA1C: No results for input(s): HGBA1C in the last 72 hours. CBG:  Recent  Labs Lab 01/15/16 2001 01/16/16 0011 01/16/16 0400 01/16/16 0746 01/16/16 1127  GLUCAP 98 171* 93 79 142*   Lipid Profile: No results for input(s): CHOL, HDL, LDLCALC, TRIG, CHOLHDL, LDLDIRECT in the last 72 hours. Thyroid Function Tests:  Recent Labs  01/15/16 0845  TSH 4.112   Anemia Panel: No results for input(s): VITAMINB12, FOLATE, FERRITIN, TIBC, IRON, RETICCTPCT in the last 72 hours. Sepsis Labs:  Recent Labs Lab 01/13/16 1937 01/13/16 2124 01/14/16 2010  LATICACIDVEN 1.92 1.47 1.34    No results found for this or any previous visit (from the past 240 hour(s)).       Radiology Studies: US Abdomen Complete  01/15/2016  CLINICAL DATA:  Abdominal pain x2 weeks.  Stomach carcinoma. EXAM: COMPLETE ABDOMINAL ULTRASOUND COMPARISON:  CT 01/13/2016 FINDINGS: Gallbladder: Physiologically distended without stones, wall thickening, or pericholecystic fluid. Sonographer reports no sonographic Murphy's sign. Common bile duct:  Normal in caliber, 5.30m diameter. Liver: Coarse echotexture without focal lesion or intrahepatic biliary ductal dilatation. IVC:  Negative Pancreas: Visualized segments unremarkable, portions obscured by overlying bowel gas. Spleen:  No focal lesion, craniocaudal 3.0cm in length. Right Kidney:  No mass or hydronephrosis, 11.6cm in length. Left Kidney:  No lesion or hydronephrosis, 10.1cm in length. Abdominal aorta:  Negative IMPRESSION: Negative.  Normal gallbladder. Electronically Signed   By: DLucrezia EuropeM.D.   On: 01/15/2016 17:06        Scheduled Meds: . antiseptic oral rinse  7 mL Mouth Rinse q12n4p  . aspirin EC  81 mg Oral QHS  . carvedilol  6.25 mg Oral BID WC  . chlorhexidine  15 mL Mouth Rinse BID  . DULoxetine  60 mg Oral BID  . enoxaparin (LOVENOX) injection  40 mg Subcutaneous QHS  . hydrochlorothiazide  25 mg Oral Daily  . insulin aspart  0-15 Units Subcutaneous Q4H  . insulin glargine  5 Units Subcutaneous QHS  . pantoprazole  40  mg Oral Daily   Continuous Infusions: . sodium chloride 1,000 mL (01/14/16 2029)  . lactated ringers with kcl 125 mL/hr at 01/16/16 0646    Time spent: > 35 minutes  VVelvet Bathe MD [facility] Pager [phone] If 7PM-7AM, please contact night-coverage [email or web address] Password [email or web address]6/24/2017, 3:34 PM

## 2016-01-16 NOTE — Progress Notes (Signed)
Subjective: Abdominal pain slowly improving (with narcotics).  Pain worse with movement, is lower abdominal, and no exacerbation of pain with eating. No blood in stool. Tolerating clear liquids. Pain from boil in right leg  Objective: Vital signs in last 24 hours: Temp:  [97.7 F (36.5 C)-98.3 F (36.8 C)] 98.3 F (36.8 C) (06/24 0625) Pulse Rate:  [63-90] 72 (06/24 0625) Resp:  [12-20] 19 (06/24 0625) BP: (120-187)/(63-100) 124/77 mmHg (06/24 0625) SpO2:  [92 %-100 %] 95 % (06/24 0625) Weight:  [107.276 kg (236 lb 8 oz)] 107.276 kg (236 lb 8 oz) (06/24 0625) Weight change: -0.224 kg (-7.9 oz) Last BM Date: 01/14/16  PE: GEN:  Obese, older-appearing than stated age ABD:  Obese, protuberant, mild lower abdominal (right and left) tenderness, some voluntary guarding, worse with flexion and rotation of torso, no peritonitis, active bowel sounds EXT (With RN chaperone):  Tender fluctuant 1cm boil right medial thigh near intertriginous fold  Lab Results: CBC    Component Value Date/Time   WBC 10.1 01/15/2016 0630   WBC 6.8 07/07/2015 1348   WBC 7.1 11/14/2013 0806   RBC 3.73* 01/15/2016 0630   RBC 4.16 07/07/2015 1348   RBC 3.99 04/28/2014 0912   RBC 4.58 11/14/2013 0806   HGB 11.1* 01/15/2016 0630   HGB 12.1 07/07/2015 1348   HGB 13.0 11/14/2013 0806   HCT 31.5* 01/15/2016 0630   HCT 36.5 07/07/2015 1348   HCT 39.6 11/14/2013 0806   PLT 296 01/15/2016 0630   PLT 353 07/07/2015 1348   PLT 360 11/14/2013 0806   MCV 84.5 01/15/2016 0630   MCV 88 07/07/2015 1348   MCV 86.5 11/14/2013 0806   MCH 29.8 01/15/2016 0630   MCH 29.1 07/07/2015 1348   MCH 28.4 11/14/2013 0806   MCHC 35.2 01/15/2016 0630   MCHC 33.2 07/07/2015 1348   MCHC 32.8 11/14/2013 0806   RDW 15.0 01/15/2016 0630   RDW 13.6 07/07/2015 1348   RDW 15.5* 11/14/2013 0806   LYMPHSABS 3.4 01/14/2016 2009   LYMPHSABS 2.4 07/07/2015 1348   LYMPHSABS 2.5 11/14/2013 0806   MONOABS 1.1* 01/14/2016 2009   MONOABS  0.7 11/14/2013 0806   EOSABS 0.2 01/14/2016 2009   EOSABS 0.3 07/07/2015 1348   EOSABS 0.5 11/14/2013 0806   BASOSABS 0.0 01/14/2016 2009   BASOSABS 0.0 07/07/2015 1348   BASOSABS 0.0 11/14/2013 0806   CMP     Component Value Date/Time   NA 142 01/15/2016 0630   NA 141 01/13/2015 1030   NA 146* 09/25/2014 0933   K 3.1* 01/15/2016 0630   K 4.9 01/13/2015 1030   K 5.1* 09/25/2014 0933   CL 109 01/15/2016 0630   CL 102 09/25/2014 0933   CO2 26 01/15/2016 0630   CO2 28 01/13/2015 1030   CO2 30 09/25/2014 0933   GLUCOSE 88 01/15/2016 0630   GLUCOSE 133 01/13/2015 1030   GLUCOSE 157* 09/25/2014 0933   BUN 6 01/15/2016 0630   BUN 18.7 01/13/2015 1030   BUN 15 09/25/2014 0933   CREATININE 1.15* 01/15/2016 0630   CREATININE 1.2* 01/13/2015 1030   CREATININE 1.10 10/22/2014 1117   CALCIUM 7.8* 01/15/2016 0630   CALCIUM 9.7 01/13/2015 1030   CALCIUM 9.5 09/25/2014 0933   PROT 6.0* 01/14/2016 2009   PROT 7.3 01/13/2015 1030   PROT 7.8 09/25/2014 0933   ALBUMIN 2.6* 01/14/2016 2009   ALBUMIN 3.5 01/13/2015 1030   ALBUMIN 3.4 09/25/2014 0933   AST 58* 01/14/2016 2009   AST  16 01/13/2015 1030   AST 20 09/25/2014 0933   ALT 46 01/14/2016 2009   ALT 17 01/13/2015 1030   ALT 16 09/25/2014 0933   ALKPHOS 72 01/14/2016 2009   ALKPHOS 70 01/13/2015 1030   ALKPHOS 82 09/25/2014 0933   BILITOT 1.3* 01/14/2016 2009   BILITOT 0.35 01/13/2015 1030   BILITOT 0.50 09/25/2014 0933   GFRNONAA 50* 01/15/2016 0630   GFRAA 57* 01/15/2016 0630   Assessment:  1.  History gastric cancer.  No recurrence or metastasis on recent CT and endoscopy. 2.  Abdominal pain.  Sounds most consistent with musculoskeletal etiology.  Could also potentially have diverticular spasm.  No gallstones on recent ultrasound.  No explanation otherwise for patient's symptoms based on labs, CT scan, endoscopy. 3.  Right thigh nodule, most consistent with small abscess.  Plan:  1.  Advance diet to full liquids. 2.   Minimize narcotics. 3.  Discontinue HIDA scan; her pain is not consistent with gallbladder etiology, and even if she has biliary dyskinesia on her HIDA scan, her pain is clinically not consistent with gallbladder disease and I can't imagine surgery entertaining cholecystectomy based solely on those results, given her history quite discordant with typical gallbladder pain. 4.  Trial of antispasmodics. 5.  Management of small thigh abscess per hospitalist team. 6.  Suspect will proceed with supportive care alone and doubt need for further invasive diagnostic testing or interventions. 7.  Will follow.   Landry Dyke 01/16/2016, 12:02 PM   Pager [phone] If no answer or after 5 PM call [phone]

## 2016-01-16 NOTE — Procedures (Deleted)
Preop: right thigh abscess  Postop: same  Procedure: incision and drainage of right thigh abscess  Details: the area was cleaned with betadine. The surrounding skin was anesthetized with 32m 1% lidocaine. Once appropriate anesthesia was established, a cruciate incision was made over the most fluctuant area. Small amount of purulence was expressed. The area was probed to ensure complete drainage. The wound was then packed with a gauze and covered with dressing. The patient tolerated the procedure well  Findings: abscess  Complications: none  Implant: gauze packed  LGurney Maxin M.D. [facility] Pg: [phone]

## 2016-01-16 NOTE — Progress Notes (Signed)
Pharmacy Antibiotic Note  [name] is a 65 y.o. female admitted on 01/14/2016 with abdominal pain.  Patient noted to have a small thigh abscess and Pharmacy has been consulted for Bactrim dosing.  Plan:  Bactrim DS 1 tab PO BID Follow up renal function & adjust if needed  Height: 5' 7"  (170.2 cm) Weight: 236 lb 8 oz (107.276 kg) IBW/kg (Calculated) : 61.6  Temp (24hrs), Avg:98.3 F (36.8 C), Min:98.3 F (36.8 C), Max:98.3 F (36.8 C)   Recent Labs Lab 01/12/16 2300 01/13/16 1927 01/13/16 1937 01/13/16 1940 01/13/16 2124 01/14/16 2009 01/14/16 2010 01/15/16 0630  WBC 8.4 8.6  --   --   --  9.4  --  10.1  CREATININE 1.35*  --   --  1.43*  --  1.37*  --  1.15*  LATICACIDVEN  --   --  1.92  --  1.47  --  1.34  --     Estimated Creatinine Clearance: 63.2 mL/min (by C-G formula based on Cr of 1.15).    Allergies  Allergen Reactions  . Adhesive [Tape] Other (See Comments)    Burn Skin  . Codeine Other (See Comments)    paranoid  . Zocor [Simvastatin - High Dose] Other (See Comments)    Muscle spasms  . Penicillins Rash    Has patient had a PCN reaction causing immediate rash, facial/tongue/throat swelling, SOB or lightheadedness with hypotension: Yes Has patient had a PCN reaction causing severe rash involving mucus membranes or skin necrosis: No Has patient had a PCN reaction that required hospitalization does not remember.  Has patient had a PCN reaction occurring within the last 10 years: No If all of the above answers are "NO", then may proceed with Cephalosporin use.     Antimicrobials this admission: 6/24 Bactrim >>  Dose adjustments this admission: ---  Microbiology results: 6/24 C.diff: 6/24 GI panel:  Thank you for allowing pharmacy to be a part of this patient's care.  Peggyann Juba, PharmD, BCPS Pager: [phone] 01/16/2016 3:50 PM

## 2016-01-16 NOTE — Procedures (Signed)
Preop: right thigh abscess  Postop: same  Procedure: incision and drainage of right thigh abscess  Details: the area was cleaned with betadine. The surrounding skin was anesthetized with 31m 1% lidocaine. Once appropriate anesthesia was established, a cruciate incision was made over the most fluctuant area. Small amount of purulence was expressed. The area was probed to ensure complete drainage. The wound was then packed with a gauze and covered with dressing. The patient tolerated the procedure well  Findings: abscess  Complications: none  Implant: gauze packed  LGurney Maxin M.D. [facility] Pg: [phone]

## 2016-01-16 NOTE — Consult Note (Deleted)
Reason for Consult: abscess Referring Physician: [name] is an 65 y.o. female.  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 She has not had previous abscesses. She has a history of diabetes with sugars usually below 200 on 2 oral agents. She felt the abscess lastnight and it has been painful since.  Past Medical History  Diagnosis Date  . stomach ca dx'd 2010    chemo/xrt comp 05/2009  . Hypertension     actually taking for DM renal protection, not DM  . Diabetes mellitus   . Asthma   . Sleep apnea   . Degenerative joint disease   . Hyperlipidemia     no longer on medication for this  . GERD (gastroesophageal reflux disease)   . Neuropathy ([place])     bilateral hands and feet  . Sickle cell trait [facility])     Past Surgical History  Procedure Laterality Date  . Total knee arthroplasty      Left  . Total hip arthroplasty      Right  . Shoulder surgery      Left  . Appendectomy    . Cesarean section    . Hernia repair    . Colon surgery    . Esophagogastroduodenoscopy N/A 10/15/2012    Procedure: ESOPHAGOGASTRODUODENOSCOPY (EGD);  Surgeon: Lear Ng, MD;  Location: [facility];  Service: Endoscopy;  Laterality: N/A;    Family History  Problem Relation Age of Onset  . Cancer Mother 34  . Alzheimer's disease Father 86  . Diabetes Brother   . Diabetes Maternal Grandmother   . Cancer Maternal Grandfather     lung  . Suicidality Neg Hx   . Depression Neg Hx   . Dementia Neg Hx   . Anxiety disorder Neg Hx     Social History:  reports that she quit smoking about 27 years ago. Her smoking use included Cigarettes. She started smoking about 42 years ago. She has a 15 pack-year smoking history. She has never used smokeless tobacco. She reports that she does not drink alcohol or use illicit drugs.  Allergies:   Allergies  Allergen Reactions  . Adhesive [Tape] Other (See Comments)    Burn Skin  . Codeine Other (See Comments)    paranoid  . Zocor [Simvastatin - High Dose] Other (See Comments)    Muscle spasms  . Penicillins Rash    Has patient had a PCN reaction causing immediate rash, facial/tongue/throat swelling, SOB or lightheadedness with hypotension: Yes Has patient had a PCN reaction causing severe rash involving mucus membranes or skin necrosis: No Has patient had a PCN reaction that required hospitalization does not remember.  Has patient had a PCN reaction occurring within the last 10 years: No If all of the above answers are "NO", then may proceed with Cephalosporin use.     Medications: I have reviewed the patient's current medications.  No results found for this or any previous visit (from the past 48 hour(s)).  US Abdomen Complete  01/15/2016  CLINICAL DATA:  Abdominal pain x2 weeks.  Stomach carcinoma. EXAM: COMPLETE ABDOMINAL ULTRASOUND COMPARISON:  CT 01/13/2016 FINDINGS: Gallbladder: Physiologically distended without stones, wall thickening, or pericholecystic fluid. Sonographer reports no sonographic Murphy's sign. Common bile duct:  Normal in caliber, 5.85m diameter. Liver: Coarse echotexture without focal lesion or intrahepatic biliary ductal dilatation. IVC:  Negative Pancreas: Visualized segments unremarkable, portions obscured by overlying bowel gas. Spleen:  No focal lesion, craniocaudal 3.0cm in length. Right Kidney:  No mass or hydronephrosis, 11.6cm in length. Left Kidney:  No lesion or hydronephrosis, 10.1cm in length. Abdominal aorta:  Negative IMPRESSION: Negative.  Normal gallbladder. Electronically Signed   By: Lucrezia Europe M.D.   On: 01/15/2016 17:06    Review of Systems  Constitutional: Negative for fever and chills.  HENT: Negative for hearing loss.   Eyes: Negative for blurred vision and double vision.  Respiratory: Negative for cough and hemoptysis.    Cardiovascular: Negative for chest pain and palpitations.  Gastrointestinal: Positive for diarrhea. Negative for nausea, vomiting and abdominal pain.  Genitourinary: Negative for dysuria and urgency.  Musculoskeletal: Negative for myalgias and neck pain.  Skin: Negative for itching and rash.  Neurological: Negative for dizziness, tingling and headaches.  Endo/Heme/Allergies: Does not bruise/bleed easily.  Psychiatric/Behavioral: Negative for depression and suicidal ideas.   Blood pressure 102/78, pulse 86, temperature 98.7 F (37.1 C), temperature source Oral, resp. rate 19, height 5' 7"  (1.702 m), weight 105.688 kg (233 lb), SpO2 100 %. Physical Exam  Vitals reviewed. Constitutional: She is oriented to person, place, and time. She appears well-developed and well-nourished.  HENT:  Head: Normocephalic and atraumatic.  Eyes: Conjunctivae and EOM are normal. Pupils are equal, round, and reactive to light.  Neck: Normal range of motion. Neck supple.  Cardiovascular: Normal rate and regular rhythm.   Respiratory: Effort normal and breath sounds normal.  GI: Soft. Bowel sounds are normal. She exhibits no distension. There is no tenderness.  Midline abdominal scar  Musculoskeletal: Normal range of motion.  Neurological: She is alert and oriented to person, place, and time.  Skin: Skin is warm and dry.  Right proximal medial thigh with 2cm raised abscess  Psychiatric: She has a normal mood and affect. Her behavior is normal.    Assessment/Plan: 65 yo female with DM, history of gastric cancer s/p billroth II, with right proximal thigh abscess. -I+D at bedside  [place] 01/16/2016, 5:20 PM

## 2016-01-17 DIAGNOSIS — R1084 Generalized abdominal pain: Secondary | ICD-10-CM | POA: Diagnosis not present

## 2016-01-17 LAB — GLUCOSE, CAPILLARY
GLUCOSE-CAPILLARY: 76 mg/dL (ref 65–99)
Glucose-Capillary: 104 mg/dL — ABNORMAL HIGH (ref 65–99)
Glucose-Capillary: 106 mg/dL — ABNORMAL HIGH (ref 65–99)
Glucose-Capillary: 113 mg/dL — ABNORMAL HIGH (ref 65–99)
Glucose-Capillary: 173 mg/dL — ABNORMAL HIGH (ref 65–99)
Glucose-Capillary: 203 mg/dL — ABNORMAL HIGH (ref 65–99)
Glucose-Capillary: 99 mg/dL (ref 65–99)

## 2016-01-17 LAB — BASIC METABOLIC PANEL
Anion gap: 3 — ABNORMAL LOW (ref 5–15)
CHLORIDE: 108 mmol/L (ref 101–111)
CO2: 28 mmol/L (ref 22–32)
CREATININE: 1.25 mg/dL — AB (ref 0.44–1.00)
Calcium: 8.2 mg/dL — ABNORMAL LOW (ref 8.9–10.3)
GFR calc Af Amer: 52 mL/min — ABNORMAL LOW (ref >60)
GFR calc non Af Amer: 45 mL/min — ABNORMAL LOW (ref >60)
Glucose, Bld: 112 mg/dL — ABNORMAL HIGH (ref 65–99)
Potassium: 4.1 mmol/L (ref 3.5–5.1)
SODIUM: 139 mmol/L (ref 135–145)

## 2016-01-17 MED ORDER — SODIUM CHLORIDE 0.9 % IV SOLN
1000.0000 mL | INTRAVENOUS | Status: DC
  Administered 2016-01-19: 1000 mL via INTRAVENOUS

## 2016-01-17 MED ORDER — POTASSIUM CHLORIDE CRYS ER 20 MEQ PO TBCR
40.0000 meq | EXTENDED_RELEASE_TABLET | Freq: Once | ORAL | Status: AC
  Administered 2016-01-17: 40 meq via ORAL
  Filled 2016-01-17: qty 2

## 2016-01-17 NOTE — Progress Notes (Signed)
Hypoglycemic Event  CBG: 55 @ 2350 pm  Treatment: 1 4 oz juice  Symptoms: None  Follow-up CBG: Time:0020 CBG Result:76  Possible Reasons for Event: Patient only on full liquid diet.  Comments/MD notified: N/A, hypoglycemia protocol followed.    Azzie Glatter [place]

## 2016-01-17 NOTE — Progress Notes (Signed)
Progress Note: General Surgery Service   Subjective: No issues overnight, thigh feels better, nauseated this mornign  Objective: Vital signs in last 24 hours: Temp:  [98.2 F (36.8 C)-99 F (37.2 C)] 98.2 F (36.8 C) (06/25 0425) Pulse Rate:  [68-103] 103 (06/25 0425) Resp:  [16] 16 (06/25 0425) BP: (95-125)/(62-77) 101/65 mmHg (06/25 0425) SpO2:  [98 %-99 %] 98 % (06/25 0425) Weight:  [111.5 kg (245 lb 13 oz)] 111.5 kg (245 lb 13 oz) (06/25 0635) Last BM Date: 01/14/16  Intake/Output from previous day: 06/24 0701 - 06/25 0700 In: 4974.2 [P.O.:457.5; I.V.:4516.7] Out: -  Intake/Output this shift:    Lungs: CTAB  Cardiovascular: tachycardic  Abd: soft, NT  Extremities: right thigh wound clean base, no bleeding  Neuro: AOx4  Lab Results: CBC   Recent Labs  01/14/16 2009 01/15/16 0630  WBC 9.4 10.1  HGB 12.0 11.1*  HCT 33.1* 31.5*  PLT 298 296   BMET  Recent Labs  01/14/16 2009 01/15/16 0630  NA 140 142  K 3.5 3.1*  CL 104 109  CO2 23 26  GLUCOSE 177* 88  BUN 8 6  CREATININE 1.37* 1.15*  CALCIUM 8.3* 7.8*   PT/INR No results for input(s): LABPROT, INR in the last 72 hours. ABG No results for input(s): PHART, HCO3 in the last 72 hours.  Invalid input(s): PCO2, PO2  Studies/Results:  Anti-infectives: Anti-infectives    Start     Dose/Rate Route Frequency Ordered Stop   01/16/16 1800  sulfamethoxazole-trimethoprim (BACTRIM DS,SEPTRA DS) 800-160 MG per tablet 1 tablet     1 tablet Oral Every 12 hours 01/16/16 1554        Medications: Scheduled Meds: . antiseptic oral rinse  7 mL Mouth Rinse q12n4p  . aspirin EC  81 mg Oral QHS  . carvedilol  6.25 mg Oral BID WC  . chlorhexidine  15 mL Mouth Rinse BID  . DULoxetine  60 mg Oral BID  . enoxaparin (LOVENOX) injection  40 mg Subcutaneous QHS  . hydrochlorothiazide  25 mg Oral Daily  . insulin aspart  0-15 Units Subcutaneous Q4H  . insulin glargine  5 Units Subcutaneous QHS  .  pantoprazole  40 mg Oral Daily  . sulfamethoxazole-trimethoprim  1 tablet Oral Q12H   Continuous Infusions: . sodium chloride 1,000 mL (01/17/16 0446)   PRN Meds:.acetaminophen **OR** acetaminophen, hyoscyamine, metaxalone, morphine injection, ondansetron **OR** ondansetron (ZOFRAN) IV, polyvinyl alcohol, sodium chloride flush  Assessment/Plan: Patient Active Problem List   Diagnosis Date Noted  . Chronic pain syndrome 01/15/2016  . Diarrhea 01/15/2016  . Generalized abdominal pain 01/14/2016  . Uncontrolled type 2 diabetes mellitus with diabetic polyneuropathy, without long-term current use of insulin ([place]) 12/30/2015  . Morbid obesity due to excess calories ([place]) 05/05/2015  . Diabetic polyneuropathy associated with type 2 diabetes mellitus ([place]) 01/02/2015  . Acute bacterial sinusitis 01/02/2015  . Onychomycosis 11/04/2014  . Pain in lower limb 11/04/2014  . Multinodular goiter (nontoxic) 10/16/2014  . CVA (cerebral infarction) 08/11/2014  . Iron deficiency anemia 05/20/2014  . Major depressive disorder, single episode, moderate ([place]) 05/20/2014  . Dizziness of unknown cause 04/28/2014  . MDD (major depressive disorder), recurrent episode, moderate ([place]) 01/28/2014  . GERD (gastroesophageal reflux disease) 01/02/2014  . Nausea 10/15/2012  . Stomach cancer ([place]) 12/10/2008  . CHRONIC RESPIRATORY FAILURE 01/03/2008  . Hyperlipidemia 06/27/2007  . OBESITY, MORBID 06/27/2007  . SLEEP APNEA, OBSTRUCTIVE 06/27/2007  . Essential hypertension 06/27/2007  . ASTHMA 06/27/2007  . OSTEOARTHRITIS  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 06/27/2007   s/p Procedure(s): ESOPHAGOGASTRODUODENOSCOPY (EGD) 01/15/2016 POD 1 right thigh I+D -continue dry bandage to area -continue bactrim for 10 days total -f/u with [facility] [place] [facility] in 3 week -please call for any new issues     Mickeal Skinner, MD Pg# [phone] [facility]

## 2016-01-17 NOTE — Progress Notes (Signed)
Subjective: Intermittent nausea and abdominal pain, but overall gradually improving. Right thigh abscess drained by surgery; this area is feeling better.  Objective: Vital signs in last 24 hours: Temp:  [98.2 F (36.8 C)-99 F (37.2 C)] 98.2 F (36.8 C) (06/25 0425) Pulse Rate:  [68-103] 103 (06/25 0425) Resp:  [16] 16 (06/25 0425) BP: (95-125)/(62-77) 101/65 mmHg (06/25 0425) SpO2:  [98 %-99 %] 98 % (06/25 0425) Weight:  [111.5 kg (245 lb 13 oz)] 111.5 kg (245 lb 13 oz) (06/25 0635) Weight change: 4.224 kg (9 lb 5 oz) Last BM Date: 01/14/16  PE: GEN:  Obese, NAD ABD:  Protuberant, soft, mild upper abdominal tenderness without peritonitis  Lab Results: CBC    Component Value Date/Time   WBC 10.1 01/15/2016 0630   WBC 6.8 07/07/2015 1348   WBC 7.1 11/14/2013 0806   RBC 3.73* 01/15/2016 0630   RBC 4.16 07/07/2015 1348   RBC 3.99 04/28/2014 0912   RBC 4.58 11/14/2013 0806   HGB 11.1* 01/15/2016 0630   HGB 12.1 07/07/2015 1348   HGB 13.0 11/14/2013 0806   HCT 31.5* 01/15/2016 0630   HCT 36.5 07/07/2015 1348   HCT 39.6 11/14/2013 0806   PLT 296 01/15/2016 0630   PLT 353 07/07/2015 1348   PLT 360 11/14/2013 0806   MCV 84.5 01/15/2016 0630   MCV 88 07/07/2015 1348   MCV 86.5 11/14/2013 0806   MCH 29.8 01/15/2016 0630   MCH 29.1 07/07/2015 1348   MCH 28.4 11/14/2013 0806   MCHC 35.2 01/15/2016 0630   MCHC 33.2 07/07/2015 1348   MCHC 32.8 11/14/2013 0806   RDW 15.0 01/15/2016 0630   RDW 13.6 07/07/2015 1348   RDW 15.5* 11/14/2013 0806   LYMPHSABS 3.4 01/14/2016 2009   LYMPHSABS 2.4 07/07/2015 1348   LYMPHSABS 2.5 11/14/2013 0806   MONOABS 1.1* 01/14/2016 2009   MONOABS 0.7 11/14/2013 0806   EOSABS 0.2 01/14/2016 2009   EOSABS 0.3 07/07/2015 1348   EOSABS 0.5 11/14/2013 0806   BASOSABS 0.0 01/14/2016 2009   BASOSABS 0.0 07/07/2015 1348   BASOSABS 0.0 11/14/2013 0806   CMP     Component Value Date/Time   NA 139 01/17/2016 1120   NA 141 01/13/2015 1030   NA  146* 09/25/2014 0933   K 4.1 01/17/2016 1120   K 4.9 01/13/2015 1030   K 5.1* 09/25/2014 0933   CL 108 01/17/2016 1120   CL 102 09/25/2014 0933   CO2 28 01/17/2016 1120   CO2 28 01/13/2015 1030   CO2 30 09/25/2014 0933   GLUCOSE 112* 01/17/2016 1120   GLUCOSE 133 01/13/2015 1030   GLUCOSE 157* 09/25/2014 0933   BUN <5* 01/17/2016 1120   BUN 18.7 01/13/2015 1030   BUN 15 09/25/2014 0933   CREATININE 1.25* 01/17/2016 1120   CREATININE 1.2* 01/13/2015 1030   CREATININE 1.10 10/22/2014 1117   CALCIUM 8.2* 01/17/2016 1120   CALCIUM 9.7 01/13/2015 1030   CALCIUM 9.5 09/25/2014 0933   PROT 6.0* 01/14/2016 2009   PROT 7.3 01/13/2015 1030   PROT 7.8 09/25/2014 0933   ALBUMIN 2.6* 01/14/2016 2009   ALBUMIN 3.5 01/13/2015 1030   ALBUMIN 3.4 09/25/2014 0933   AST 58* 01/14/2016 2009   AST 16 01/13/2015 1030   AST 20 09/25/2014 0933   ALT 46 01/14/2016 2009   ALT 17 01/13/2015 1030   ALT 16 09/25/2014 0933   ALKPHOS 72 01/14/2016 2009   ALKPHOS 70 01/13/2015 1030   ALKPHOS 82 09/25/2014 0933  BILITOT 1.3* 01/14/2016 2009   BILITOT 0.35 01/13/2015 1030   BILITOT 0.50 09/25/2014 0933   GFRNONAA 45* 01/17/2016 1120   GFRAA 52* 01/17/2016 1120   Assessment:   1. History gastric cancer. No recurrence or metastasis on recent CT and endoscopy. 2. Abdominal pain. Sounds most consistent with musculoskeletal etiology. Could also potentially have diverticular spasm. No gallstones on recent ultrasound. No explanation otherwise for patient's symptoms based on labs, CT scan, endoscopy. 3. Nausea. 4.  Right thigh nodule, most consistent with small abscess, drained yesterday by surgery.  Plan:  1.  Slowly advance diet. 2.  Antiemetics as-needed. 3.  Pantoprazole 40 mg po qd. 4.  Hyoscyamine prn first-line for abdominal pain; minimize narcotics. 5.  Doubt any utility in further imaging or GI work-up for this abdominal pain. 6.  I don't see any reason why patient can't be  discharged home tomorrow with outpatient GI follow-up. 7.  Will sign-off; please call with questions; thanks for the consultation. 8.  I will arrange outpatient follow-up with [facility] ([phone]).   Landry Dyke 01/17/2016, 12:15 PM   Pager [phone] If no answer or after 5 PM call [phone]

## 2016-01-17 NOTE — Progress Notes (Signed)
PROGRESS NOTE    [name]  OVZ:[id number] DOB: [birth year] DOA: 01/14/2016 PCP: Ann Held, DO    Brief Narrative:  65 y.o. female with medical history significant of DM, GERD, and stomach cancer in 2010 presening with abdominal pain. Pain has been going on for over 1 week. Has not eaten in 4-5 days. When pain started, had been eating chicken noodle soup constantly with crackers. Was seen by [facility] June 12 and they were concerned about unexplained weight loss.  Assessment & Plan:   1. Abd pain - - endoscopic evaluation did not reveal any source. Patient reports recent diarrhea. Gi to try antispasmodics - Will continue supportive therapy - advancing diet slowly, agree with avoiding opiods.   2. DM  - SSI   3. H/o gastric cancer  - Patient can follow-up with oncologist after hospital discharge unless GI workup reveals reason for patient to be evaluated sooner.  4. Chronic pain syndrome  - Continue current pain medication regimen  5. Thigh abscess - s/p I/D - Continue on bactrim.  DVT prophylaxis: Lovenox Code Status: full Family Communication: None at bedside Disposition Plan: Pending improvement in condition   Consultants:   Gastroenterology   Procedures: pending   Antimicrobials: none   Subjective: Pt feels better today. Still having some trouble eating and earlier this AM complained of nausea.   Objective: Filed Vitals:   01/16/16 1353 01/16/16 1954 01/17/16 0425 01/17/16 0635  BP: 125/77 95/62 101/65   Pulse: 68 71 103   Temp: 98.3 F (36.8 C) 99 F (37.2 C) 98.2 F (36.8 C)   TempSrc: Oral Oral Oral   Resp: 16 16 16    Height:      Weight:    111.5 kg (245 lb 13 oz)  SpO2: 98% 99% 98%     Intake/Output Summary (Last 24 hours) at 01/17/16 1253 Last data filed at 01/17/16 0446  Gross per 24 hour  Intake 3234.17 ml  Output      0 ml  Net 3234.17 ml   Filed Weights   01/14/16 2333 01/16/16 0625 01/17/16 0635  Weight: 107.5 kg  (236 lb 15.9 oz) 107.276 kg (236 lb 8 oz) 111.5 kg (245 lb 13 oz)    Examination:  General exam: Appears calm and comfortable  Respiratory system: Clear to auscultation. Respiratory effort normal. Cardiovascular system: S1 & S2 heard, RRR.  Gastrointestinal system: Abdomen is nondistended, soft and nontender. No organomegaly or masses felt.  Central nervous system: Alert and oriented. No focal neurological deficits. Extremities: Symmetric 5 x 5 power. Skin: no new rashes Psychiatry:  Mood & affect appropriate.     Data Reviewed: I have personally reviewed following labs and imaging studies  CBC:  Recent Labs Lab 01/12/16 2300 01/13/16 1927 01/14/16 2009 01/15/16 0630  WBC 8.4 8.6 9.4 10.1  NEUTROABS  --  5.3 4.8  --   HGB 14.0 13.0 12.0 11.1*  HCT 39.9 37.3 33.1* 31.5*  MCV 84.4 85.0 83.0 84.5  PLT 361 388 298 412   Basic Metabolic Panel:  Recent Labs Lab 01/12/16 2300 01/13/16 1940 01/14/16 2009 01/15/16 0630 01/17/16 1120  NA 139 139 140 142 139  K 3.9 4.9 3.5 3.1* 4.1  CL 100* 103 104 109 108  CO2 26 23 23 26 28   GLUCOSE 199* 212* 177* 88 112*  BUN 13 11 8 6  <5*  CREATININE 1.35* 1.43* 1.37* 1.15* 1.25*  CALCIUM 8.7* 8.2* 8.3* 7.8* 8.2*   GFR: Estimated Creatinine Clearance:  59.3 mL/min (by C-G formula based on Cr of 1.25). Liver Function Tests:  Recent Labs Lab 01/12/16 2300 01/13/16 1940 01/14/16 2009  AST 53* 62* 58*  ALT 54 47 46  ALKPHOS 93 72 72  BILITOT 1.0 1.2 1.3*  PROT 6.8 5.9* 6.0*  ALBUMIN 2.8* 2.5* 2.6*    Recent Labs Lab 01/12/16 2300 01/13/16 1940 01/14/16 2009  LIPASE <10* 12 14   No results for input(s): AMMONIA in the last 168 hours. Coagulation Profile: No results for input(s): INR, PROTIME in the last 168 hours. Cardiac Enzymes: No results for input(s): CKTOTAL, CKMB, CKMBINDEX, TROPONINI in the last 168 hours. BNP (last 3 results) No results for input(s): PROBNP in the last 8760 hours. HbA1C: No results for  input(s): HGBA1C in the last 72 hours. CBG:  Recent Labs Lab 01/16/16 2351 01/17/16 0019 01/17/16 0405 01/17/16 0744 01/17/16 1212  GLUCAP 55* 76 113* 106* 99   Lipid Profile: No results for input(s): CHOL, HDL, LDLCALC, TRIG, CHOLHDL, LDLDIRECT in the last 72 hours. Thyroid Function Tests:  Recent Labs  01/15/16 0845  TSH 4.112   Anemia Panel: No results for input(s): VITAMINB12, FOLATE, FERRITIN, TIBC, IRON, RETICCTPCT in the last 72 hours. Sepsis Labs:  Recent Labs Lab 01/13/16 1937 01/13/16 2124 01/14/16 2010  LATICACIDVEN 1.92 1.47 1.34    No results found for this or any previous visit (from the past 240 hour(s)).       Radiology Studies: US Abdomen Complete  01/15/2016  CLINICAL DATA:  Abdominal pain x2 weeks.  Stomach carcinoma. EXAM: COMPLETE ABDOMINAL ULTRASOUND COMPARISON:  CT 01/13/2016 FINDINGS: Gallbladder: Physiologically distended without stones, wall thickening, or pericholecystic fluid. Sonographer reports no sonographic Murphy's sign. Common bile duct:  Normal in caliber, 5.67m diameter. Liver: Coarse echotexture without focal lesion or intrahepatic biliary ductal dilatation. IVC:  Negative Pancreas: Visualized segments unremarkable, portions obscured by overlying bowel gas. Spleen:  No focal lesion, craniocaudal 3.0cm in length. Right Kidney:  No mass or hydronephrosis, 11.6cm in length. Left Kidney:  No lesion or hydronephrosis, 10.1cm in length. Abdominal aorta:  Negative IMPRESSION: Negative.  Normal gallbladder. Electronically Signed   By: DLucrezia EuropeM.D.   On: 01/15/2016 17:06        Scheduled Meds: . antiseptic oral rinse  7 mL Mouth Rinse q12n4p  . aspirin EC  81 mg Oral QHS  . carvedilol  6.25 mg Oral BID WC  . chlorhexidine  15 mL Mouth Rinse BID  . DULoxetine  60 mg Oral BID  . enoxaparin (LOVENOX) injection  40 mg Subcutaneous QHS  . hydrochlorothiazide  25 mg Oral Daily  . insulin aspart  0-15 Units Subcutaneous Q4H  . insulin  glargine  5 Units Subcutaneous QHS  . pantoprazole  40 mg Oral Daily  . sulfamethoxazole-trimethoprim  1 tablet Oral Q12H   Continuous Infusions: . sodium chloride 1,000 mL (01/17/16 1116)    Time spent: > 35 minutes  VVelvet Bathe MD [facility] Pager [phone] If 7PM-7AM, please contact night-coverage [email or web address] Password [facility]01/17/2016, 12:53 PM

## 2016-01-18 ENCOUNTER — Ambulatory Visit: Payer: Medicare HMO | Admitting: Family Medicine

## 2016-01-18 ENCOUNTER — Encounter: Payer: Self-pay | Admitting: Family Medicine

## 2016-01-18 ENCOUNTER — Encounter (HOSPITAL_COMMUNITY): Payer: Self-pay | Admitting: Gastroenterology

## 2016-01-18 DIAGNOSIS — G894 Chronic pain syndrome: Secondary | ICD-10-CM | POA: Diagnosis present

## 2016-01-18 DIAGNOSIS — Z9221 Personal history of antineoplastic chemotherapy: Secondary | ICD-10-CM | POA: Diagnosis not present

## 2016-01-18 DIAGNOSIS — K219 Gastro-esophageal reflux disease without esophagitis: Secondary | ICD-10-CM | POA: Diagnosis present

## 2016-01-18 DIAGNOSIS — Z923 Personal history of irradiation: Secondary | ICD-10-CM | POA: Diagnosis not present

## 2016-01-18 DIAGNOSIS — Z7982 Long term (current) use of aspirin: Secondary | ICD-10-CM | POA: Diagnosis not present

## 2016-01-18 DIAGNOSIS — Z6836 Body mass index (BMI) 36.0-36.9, adult: Secondary | ICD-10-CM | POA: Diagnosis not present

## 2016-01-18 DIAGNOSIS — Z96652 Presence of left artificial knee joint: Secondary | ICD-10-CM | POA: Diagnosis present

## 2016-01-18 DIAGNOSIS — L02415 Cutaneous abscess of right lower limb: Secondary | ICD-10-CM | POA: Diagnosis present

## 2016-01-18 DIAGNOSIS — Z96641 Presence of right artificial hip joint: Secondary | ICD-10-CM | POA: Diagnosis present

## 2016-01-18 DIAGNOSIS — Z79899 Other long term (current) drug therapy: Secondary | ICD-10-CM | POA: Diagnosis not present

## 2016-01-18 DIAGNOSIS — E1142 Type 2 diabetes mellitus with diabetic polyneuropathy: Secondary | ICD-10-CM | POA: Diagnosis present

## 2016-01-18 DIAGNOSIS — D573 Sickle-cell trait: Secondary | ICD-10-CM | POA: Diagnosis present

## 2016-01-18 DIAGNOSIS — Z934 Other artificial openings of gastrointestinal tract status: Secondary | ICD-10-CM | POA: Diagnosis not present

## 2016-01-18 DIAGNOSIS — R1084 Generalized abdominal pain: Secondary | ICD-10-CM | POA: Diagnosis present

## 2016-01-18 DIAGNOSIS — G4730 Sleep apnea, unspecified: Secondary | ICD-10-CM | POA: Diagnosis present

## 2016-01-18 DIAGNOSIS — Z833 Family history of diabetes mellitus: Secondary | ICD-10-CM | POA: Diagnosis not present

## 2016-01-18 DIAGNOSIS — K30 Functional dyspepsia: Secondary | ICD-10-CM | POA: Diagnosis present

## 2016-01-18 DIAGNOSIS — E785 Hyperlipidemia, unspecified: Secondary | ICD-10-CM | POA: Diagnosis present

## 2016-01-18 DIAGNOSIS — N182 Chronic kidney disease, stage 2 (mild): Secondary | ICD-10-CM | POA: Diagnosis present

## 2016-01-18 DIAGNOSIS — E876 Hypokalemia: Secondary | ICD-10-CM | POA: Diagnosis present

## 2016-01-18 DIAGNOSIS — Z79891 Long term (current) use of opiate analgesic: Secondary | ICD-10-CM | POA: Diagnosis not present

## 2016-01-18 DIAGNOSIS — Z903 Acquired absence of stomach [part of]: Secondary | ICD-10-CM | POA: Diagnosis not present

## 2016-01-18 DIAGNOSIS — E1122 Type 2 diabetes mellitus with diabetic chronic kidney disease: Secondary | ICD-10-CM | POA: Diagnosis present

## 2016-01-18 DIAGNOSIS — Z85028 Personal history of other malignant neoplasm of stomach: Secondary | ICD-10-CM | POA: Diagnosis not present

## 2016-01-18 DIAGNOSIS — Z87891 Personal history of nicotine dependence: Secondary | ICD-10-CM | POA: Diagnosis not present

## 2016-01-18 DIAGNOSIS — E1165 Type 2 diabetes mellitus with hyperglycemia: Secondary | ICD-10-CM | POA: Diagnosis present

## 2016-01-18 DIAGNOSIS — I10 Essential (primary) hypertension: Secondary | ICD-10-CM | POA: Diagnosis present

## 2016-01-18 DIAGNOSIS — K449 Diaphragmatic hernia without obstruction or gangrene: Secondary | ICD-10-CM | POA: Diagnosis present

## 2016-01-18 DIAGNOSIS — Z820 Family history of epilepsy and other diseases of the nervous system: Secondary | ICD-10-CM | POA: Diagnosis not present

## 2016-01-18 LAB — GLUCOSE, CAPILLARY
GLUCOSE-CAPILLARY: 105 mg/dL — AB (ref 65–99)
GLUCOSE-CAPILLARY: 87 mg/dL (ref 65–99)
GLUCOSE-CAPILLARY: 116 mg/dL — AB (ref 65–99)
GLUCOSE-CAPILLARY: 186 mg/dL — AB (ref 65–99)
GLUCOSE-CAPILLARY: 267 mg/dL — AB (ref 65–99)

## 2016-01-18 MED ORDER — KCL IN DEXTROSE-NACL 40-5-0.45 MEQ/L-%-% IV SOLN
INTRAVENOUS | Status: DC
  Filled 2016-01-18: qty 1000

## 2016-01-18 NOTE — Progress Notes (Signed)
PROGRESS NOTE    [name]  YIF:[id number] DOB: 01-05-1951 DOA: 01/14/2016 PCP: Ann Held, DO    Brief Narrative:  65 y.o. female with medical history significant of DM, GERD, and stomach cancer in 2010 presening with abdominal pain. Pain has been going on for over 1 week. Has not eaten in 4-5 days. When pain started, had been eating chicken noodle soup constantly with crackers. Was seen by [facility] June 12 and they were concerned about unexplained weight loss.  Assessment & Plan:   1. Abd pain - - endoscopic evaluation did not reveal any source. Patient reports recent diarrhea. GI recommended antispasmodics - Will continue supportive therapy - advancing diet slowly, agree with avoiding opiods. Still poor oral intake.   2. DM  - SSI   3. H/o gastric cancer  - Patient can follow-up with oncologist after hospital discharge unless GI workup reveals reason for patient to be evaluated sooner.  4. Chronic pain syndrome  - Continue current pain medication regimen  5. Thigh abscess - s/p I/D - Continue on bactrim.  DVT prophylaxis: Lovenox Code Status: full Family Communication: None at bedside Disposition Plan: Pending improvement in condition   Consultants:   Gastroenterology   Procedures: pending   Antimicrobials: none   Subjective: Pt still complaining of nausea  Objective: Filed Vitals:   01/17/16 1407 01/17/16 2004 01/18/16 0255 01/18/16 0430  BP: 110/56 121/80  96/59  Pulse: 68 64  60  Temp: 97.9 F (36.6 C) 98.9 F (37.2 C)  98.5 F (36.9 C)  TempSrc: Oral Oral  Oral  Resp: 16 16  16   Height:      Weight:   107.9 kg (237 lb 14 oz)   SpO2: 93% 100%  93%    Intake/Output Summary (Last 24 hours) at 01/18/16 1356 Last data filed at 01/18/16 0559  Gross per 24 hour  Intake 1415.83 ml  Output      0 ml  Net 1415.83 ml   Filed Weights   01/16/16 0625 01/17/16 0635 01/18/16 0255  Weight: 107.276 kg (236 lb 8 oz) 111.5 kg (245 lb 13 oz)  107.9 kg (237 lb 14 oz)    Examination:  General exam: Appears calm and comfortable  Respiratory system: Clear to auscultation. Respiratory effort normal. Cardiovascular system: S1 & S2 heard, RRR.  Gastrointestinal system: Abdomen is nondistended, soft and nontender. No organomegaly or masses felt.  Central nervous system: Alert and oriented. No focal neurological deficits. Extremities: Symmetric 5 x 5 power. Skin: no new rashes Psychiatry:  Mood & affect appropriate.     Data Reviewed: I have personally reviewed following labs and imaging studies  CBC:  Recent Labs Lab 01/12/16 2300 01/13/16 1927 01/14/16 2009 01/15/16 0630  WBC 8.4 8.6 9.4 10.1  NEUTROABS  --  5.3 4.8  --   HGB 14.0 13.0 12.0 11.1*  HCT 39.9 37.3 33.1* 31.5*  MCV 84.4 85.0 83.0 84.5  PLT 361 388 298 867   Basic Metabolic Panel:  Recent Labs Lab 01/12/16 2300 01/13/16 1940 01/14/16 2009 01/15/16 0630 01/17/16 1120  NA 139 139 140 142 139  K 3.9 4.9 3.5 3.1* 4.1  CL 100* 103 104 109 108  CO2 26 23 23 26 28   GLUCOSE 199* 212* 177* 88 112*  BUN 13 11 8 6  <5*  CREATININE 1.35* 1.43* 1.37* 1.15* 1.25*  CALCIUM 8.7* 8.2* 8.3* 7.8* 8.2*   GFR: Estimated Creatinine Clearance: 58.3 mL/min (by C-G formula based on Cr of  1.25). Liver Function Tests:  Recent Labs Lab 01/12/16 2300 01/13/16 1940 01/14/16 2009  AST 53* 62* 58*  ALT 54 47 46  ALKPHOS 93 72 72  BILITOT 1.0 1.2 1.3*  PROT 6.8 5.9* 6.0*  ALBUMIN 2.8* 2.5* 2.6*    Recent Labs Lab 01/12/16 2300 01/13/16 1940 01/14/16 2009  LIPASE <10* 12 14   No results for input(s): AMMONIA in the last 168 hours. Coagulation Profile: No results for input(s): INR, PROTIME in the last 168 hours. Cardiac Enzymes: No results for input(s): CKTOTAL, CKMB, CKMBINDEX, TROPONINI in the last 168 hours. BNP (last 3 results) No results for input(s): PROBNP in the last 8760 hours. HbA1C: No results for input(s): HGBA1C in the last 72  hours. CBG:  Recent Labs Lab 01/17/16 2007 01/17/16 2350 01/18/16 0427 01/18/16 0756 01/18/16 1152  GLUCAP 173* 104* 105* 87 116*   Lipid Profile: No results for input(s): CHOL, HDL, LDLCALC, TRIG, CHOLHDL, LDLDIRECT in the last 72 hours. Thyroid Function Tests: No results for input(s): TSH, T4TOTAL, FREET4, T3FREE, THYROIDAB in the last 72 hours. Anemia Panel: No results for input(s): VITAMINB12, FOLATE, FERRITIN, TIBC, IRON, RETICCTPCT in the last 72 hours. Sepsis Labs:  Recent Labs Lab 01/13/16 1937 01/13/16 2124 01/14/16 2010  LATICACIDVEN 1.92 1.47 1.34    No results found for this or any previous visit (from the past 240 hour(s)).       Radiology Studies: No results found.      Scheduled Meds: . antiseptic oral rinse  7 mL Mouth Rinse q12n4p  . aspirin EC  81 mg Oral QHS  . carvedilol  6.25 mg Oral BID WC  . chlorhexidine  15 mL Mouth Rinse BID  . DULoxetine  60 mg Oral BID  . enoxaparin (LOVENOX) injection  40 mg Subcutaneous QHS  . hydrochlorothiazide  25 mg Oral Daily  . insulin aspart  0-15 Units Subcutaneous Q4H  . insulin glargine  5 Units Subcutaneous QHS  . pantoprazole  40 mg Oral Daily  . sulfamethoxazole-trimethoprim  1 tablet Oral Q12H   Continuous Infusions: . sodium chloride 1,000 mL (01/17/16 1116)  . dextrose 5 % and 0.45 % NaCl with KCl 40 mEq/L      Time spent: > 35 minutes  Velvet Bathe, MD [facility] Pager [phone]  If 7PM-7AM, please contact night-coverage [email or web address] Password [email or web address] 01/18/2016, 1:56 PM

## 2016-01-19 ENCOUNTER — Telehealth: Payer: Self-pay | Admitting: Family Medicine

## 2016-01-19 LAB — GLUCOSE, CAPILLARY
GLUCOSE-CAPILLARY: 92 mg/dL (ref 65–99)
GLUCOSE-CAPILLARY: 114 mg/dL — AB (ref 65–99)
GLUCOSE-CAPILLARY: 122 mg/dL — AB (ref 65–99)
Glucose-Capillary: 102 mg/dL — ABNORMAL HIGH (ref 65–99)
Glucose-Capillary: 131 mg/dL — ABNORMAL HIGH (ref 65–99)
Glucose-Capillary: 73 mg/dL (ref 65–99)

## 2016-01-19 MED ORDER — METOCLOPRAMIDE HCL 5 MG/ML IJ SOLN
10.0000 mg | Freq: Four times a day (QID) | INTRAMUSCULAR | Status: DC
  Administered 2016-01-19 – 2016-01-20 (×5): 10 mg via INTRAVENOUS
  Filled 2016-01-19 (×5): qty 2

## 2016-01-19 NOTE — Care Management Note (Signed)
Case Management Note  Patient Details  Name: [name] MRN: [id number] Date of Birth: [birth year]  Subjective/Objective:        65 yo admitted with abdominal pain.            Action/Plan: From home with family.  Chart reviewed and CM following for DC needs.  Expected Discharge Date:                  Expected Discharge Plan:  Home/Self Care  In-House Referral:     Discharge planning Services  CM Consult  Post Acute Care Choice:    Choice offered to:     DME Arranged:    DME Agency:     HH Arranged:    HH Agency:     Status of Service:  In process, will continue to follow  If discussed at Long Length of Stay Meetings, dates discussed:    Additional CommentsLynnell Catalan, RN 01/19/2016, 12:41 PM  [phone]

## 2016-01-19 NOTE — Telephone Encounter (Signed)
Patient was No Show on 6/26. No Show on Dr. Lorelei Pont 6/21 and No Show on Edward 5/1. Charge or No Charge?

## 2016-01-19 NOTE — Progress Notes (Signed)
PROGRESS NOTE    [name]  EUM:[id number] DOB: May 05, 1951 DOA: 01/14/2016 PCP: Ann Held, DO    Brief Narrative:  65 y.o. female with medical history significant of DM, GERD, and stomach cancer in 2010 presening with abdominal pain. Pain has been going on for over 1 week. Has not eaten in 4-5 days. When pain started, had been eating chicken noodle soup constantly with crackers. Was seen by [facility] June 12 and they were concerned about unexplained weight loss.  Assessment & Plan:   1. Abd pain - - endoscopic evaluation did not reveal any source. Patient reports recent diarrhea. GI recommended antispasmodics - Will continue supportive therapy - advancing diet slowly, agree with avoiding opiods. Still poor oral intake. - Not much improvement in condition despite GI recommendations as such will start trial of Reglan.   2. DM  - SSI   3. H/o gastric cancer  - Patient can follow-up with oncologist after hospital discharge unless GI workup reveals reason for patient to be evaluated sooner.  4. Chronic pain syndrome  - Continue current pain medication regimen  5. Thigh abscess - s/p I/D - Continue on bactrim.  DVT prophylaxis: Lovenox Code Status: full Family Communication: None at bedside Disposition Plan: Pending improvement in condition   Consultants:   Gastroenterology   Procedures: pending   Antimicrobials: none   Subjective: Pt still complaining of nausea and poor oral intake  Objective: Filed Vitals:   01/18/16 1359 01/18/16 2023 01/19/16 0557 01/19/16 1320  BP: 121/96 97/69 136/83 111/85  Pulse: 61 76 66 73  Temp: 98.6 F (37 C) 98.8 F (37.1 C) 98.2 F (36.8 C) 99 F (37.2 C)  TempSrc: Oral Oral Oral Oral  Resp: 16 17 17 16   Height:      Weight:   106.6 kg (235 lb 0.2 oz)   SpO2: 96% 98% 100% 100%    Intake/Output Summary (Last 24 hours) at 01/19/16 1608 Last data filed at 01/19/16 1321  Gross per 24 hour  Intake    240 ml    Output   2175 ml  Net  -1935 ml   Filed Weights   01/17/16 0635 01/18/16 0255 01/19/16 0557  Weight: 111.5 kg (245 lb 13 oz) 107.9 kg (237 lb 14 oz) 106.6 kg (235 lb 0.2 oz)    Examination:  General exam: Appears calm and comfortable  Respiratory system: Clear to auscultation. Respiratory effort normal. Cardiovascular system: S1 & S2 heard, RRR.  Gastrointestinal system: Abdomen is nondistended, soft and nontender. No organomegaly or masses felt.  Central nervous system: Alert and oriented. No focal neurological deficits. Extremities: Symmetric 5 x 5 power. Skin: no new rashes Psychiatry:  Mood & affect appropriate.     Data Reviewed: I have personally reviewed following labs and imaging studies  CBC:  Recent Labs Lab 01/12/16 2300 01/13/16 1927 01/14/16 2009 01/15/16 0630  WBC 8.4 8.6 9.4 10.1  NEUTROABS  --  5.3 4.8  --   HGB 14.0 13.0 12.0 11.1*  HCT 39.9 37.3 33.1* 31.5*  MCV 84.4 85.0 83.0 84.5  PLT 361 388 298 540   Basic Metabolic Panel:  Recent Labs Lab 01/12/16 2300 01/13/16 1940 01/14/16 2009 01/15/16 0630 01/17/16 1120  NA 139 139 140 142 139  K 3.9 4.9 3.5 3.1* 4.1  CL 100* 103 104 109 108  CO2 26 23 23 26 28   GLUCOSE 199* 212* 177* 88 112*  BUN 13 11 8 6  <5*  CREATININE 1.35* 1.43*  1.37* 1.15* 1.25*  CALCIUM 8.7* 8.2* 8.3* 7.8* 8.2*   GFR: Estimated Creatinine Clearance: 57.9 mL/min (by C-G formula based on Cr of 1.25). Liver Function Tests:  Recent Labs Lab 01/12/16 2300 01/13/16 1940 01/14/16 2009  AST 53* 62* 58*  ALT 54 47 46  ALKPHOS 93 72 72  BILITOT 1.0 1.2 1.3*  PROT 6.8 5.9* 6.0*  ALBUMIN 2.8* 2.5* 2.6*    Recent Labs Lab 01/12/16 2300 01/13/16 1940 01/14/16 2009  LIPASE <10* 12 14   No results for input(s): AMMONIA in the last 168 hours. Coagulation Profile: No results for input(s): INR, PROTIME in the last 168 hours. Cardiac Enzymes: No results for input(s): CKTOTAL, CKMB, CKMBINDEX, TROPONINI in the last  168 hours. BNP (last 3 results) No results for input(s): PROBNP in the last 8760 hours. HbA1C: No results for input(s): HGBA1C in the last 72 hours. CBG:  Recent Labs Lab 01/18/16 2020 01/19/16 0002 01/19/16 0406 01/19/16 0744 01/19/16 1205  GLUCAP 267* 73 92 102* 114*   Lipid Profile: No results for input(s): CHOL, HDL, LDLCALC, TRIG, CHOLHDL, LDLDIRECT in the last 72 hours. Thyroid Function Tests: No results for input(s): TSH, T4TOTAL, FREET4, T3FREE, THYROIDAB in the last 72 hours. Anemia Panel: No results for input(s): VITAMINB12, FOLATE, FERRITIN, TIBC, IRON, RETICCTPCT in the last 72 hours. Sepsis Labs:  Recent Labs Lab 01/13/16 1937 01/13/16 2124 01/14/16 2010  LATICACIDVEN 1.92 1.47 1.34    No results found for this or any previous visit (from the past 240 hour(s)).       Radiology Studies: No results found.      Scheduled Meds: . antiseptic oral rinse  7 mL Mouth Rinse q12n4p  . aspirin EC  81 mg Oral QHS  . carvedilol  6.25 mg Oral BID WC  . chlorhexidine  15 mL Mouth Rinse BID  . DULoxetine  60 mg Oral BID  . enoxaparin (LOVENOX) injection  40 mg Subcutaneous QHS  . hydrochlorothiazide  25 mg Oral Daily  . insulin aspart  0-15 Units Subcutaneous Q4H  . insulin glargine  5 Units Subcutaneous QHS  . metoCLOPramide (REGLAN) injection  10 mg Intravenous Q6H  . pantoprazole  40 mg Oral Daily  . sulfamethoxazole-trimethoprim  1 tablet Oral Q12H   Continuous Infusions: . sodium chloride 1,000 mL (01/17/16 1116)    Time spent: > 35 minutes  Velvet Bathe, MD [facility] Pager [phone]  If 7PM-7AM, please contact night-coverage [email or web address] Password [facility] 01/19/2016, 4:08 PM

## 2016-01-19 NOTE — Progress Notes (Signed)
Nutrition Follow-up  DOCUMENTATION CODES:   Obesity unspecified  INTERVENTION:   If patient unable to consume/tolerate PO intake, may need to consider nutrition support.   Encourage PO intake RD to follow-up for supplemental needs  NUTRITION DIAGNOSIS:   Inadequate oral intake related to inability to eat as evidenced by NPO status.  Now on soft diet. Now related to nausea as evidenced by pt report.  GOAL:   Patient will meet greater than or equal to 90% of their needs  Not meeting.  MONITOR:   PO intake, Labs, Weight trends, Skin, I & O's  ASSESSMENT:   65 y.o. female with medical history significant of DM, GERD, and stomach cancer in 2010 presening with abdominal pain. Pain has been going on for over 1 week. Has not eaten in 4-5 days. When pain started, had been eating chicken noodle soup constantly with crackers. Was seen by [facility] June 12 and they were concerned about unexplained weight loss - was 250 and now in 230 range over the last month; she did have TTP in LLQ at that visit. Pain started acutely a few days later in the same location. Since onset, pain is constant. Feels like a nagging sensation with occasional bouts of more severe pain. Has intermittent vomiting. Stool looks dark green and loose. Denies recent antibiotics. +diarrhea - 3 stools today, small. +chills, no fevers. No sick contacts. No camping. Pain is now throughout lower abdomen. Able to keep water down but unable to keep food down (ate 1 cracker today and vomited).  Pt in room c/o pain and nausea. Pt states she has not had anything to eat. She states she ate eggs and potatoes yesterday for lunch and soup last night. She has no desire to eat. Pt tried to drink some ginger ale but didn't want more than a sip. She declines trying any nutritional supplements at this time.  If patient unable to consume PO intake, may need to consider nutrition support.  Will monitor for improvements in PO intake and  nausea.   Medications: Reglan IV every 6 hours, Zofran tablet PRN Labs reviewed: CBGs: 92-114  Diet Order:  DIET SOFT Room service appropriate?: Yes; Fluid consistency:: Thin  Skin:  Reviewed, no issues  Last BM:  6/22  Height:   Ht Readings from Last 1 Encounters:  01/14/16 5' 7"  (1.702 m)    Weight:   Wt Readings from Last 1 Encounters:  01/19/16 235 lb 0.2 oz (106.6 kg)    Ideal Body Weight:  61.36 kg (kg)  BMI:  Body mass index is 36.8 kg/(m^2).  Estimated Nutritional Needs:   Kcal:  1700-1900  Protein:  85-95 grams  Fluid:  2 L/day  EDUCATION NEEDS:   Education needs addressed  Clayton Bibles, MS, RD, LDN Pager: [phone] After Hours Pager: [phone]

## 2016-01-20 DIAGNOSIS — K9289 Other specified diseases of the digestive system: Secondary | ICD-10-CM | POA: Diagnosis present

## 2016-01-20 DIAGNOSIS — N182 Chronic kidney disease, stage 2 (mild): Secondary | ICD-10-CM

## 2016-01-20 DIAGNOSIS — E1142 Type 2 diabetes mellitus with diabetic polyneuropathy: Secondary | ICD-10-CM

## 2016-01-20 DIAGNOSIS — G894 Chronic pain syndrome: Secondary | ICD-10-CM

## 2016-01-20 DIAGNOSIS — N183 Chronic kidney disease, stage 3 unspecified: Secondary | ICD-10-CM | POA: Diagnosis present

## 2016-01-20 DIAGNOSIS — E1165 Type 2 diabetes mellitus with hyperglycemia: Secondary | ICD-10-CM

## 2016-01-20 DIAGNOSIS — L02415 Cutaneous abscess of right lower limb: Secondary | ICD-10-CM | POA: Diagnosis present

## 2016-01-20 DIAGNOSIS — E1122 Type 2 diabetes mellitus with diabetic chronic kidney disease: Secondary | ICD-10-CM

## 2016-01-20 DIAGNOSIS — R1084 Generalized abdominal pain: Secondary | ICD-10-CM

## 2016-01-20 DIAGNOSIS — R197 Diarrhea, unspecified: Secondary | ICD-10-CM

## 2016-01-20 LAB — BASIC METABOLIC PANEL
Anion gap: 7 (ref 5–15)
BUN: <5 mg/dL — ABNORMAL LOW (ref 6–20)
CALCIUM: 8.6 mg/dL — AB (ref 8.9–10.3)
CO2: 27 mmol/L (ref 22–32)
CREATININE: 1.35 mg/dL — AB (ref 0.44–1.00)
Chloride: 106 mmol/L (ref 101–111)
GFR, EST AFRICAN AMERICAN: 47 mL/min — AB (ref >60)
GFR, EST NON AFRICAN AMERICAN: 41 mL/min — AB (ref >60)
Glucose, Bld: 113 mg/dL — ABNORMAL HIGH (ref 65–99)
Potassium: 3.5 mmol/L (ref 3.5–5.1)
SODIUM: 140 mmol/L (ref 135–145)

## 2016-01-20 LAB — GLUCOSE, CAPILLARY
GLUCOSE-CAPILLARY: 115 mg/dL — AB (ref 65–99)
GLUCOSE-CAPILLARY: 104 mg/dL — AB (ref 65–99)
GLUCOSE-CAPILLARY: 144 mg/dL — AB (ref 65–99)
Glucose-Capillary: 171 mg/dL — ABNORMAL HIGH (ref 65–99)

## 2016-01-20 MED ORDER — METOCLOPRAMIDE HCL 5 MG PO TABS: 5.0000 mg | ORAL_TABLET | Freq: Three times a day (TID) | ORAL | Status: DC | PRN

## 2016-01-20 MED ORDER — HYOSCYAMINE SULFATE 0.125 MG SL SUBL: 0.1250 mg | SUBLINGUAL_TABLET | Freq: Four times a day (QID) | SUBLINGUAL | Status: DC | PRN

## 2016-01-20 MED ORDER — SULFAMETHOXAZOLE-TRIMETHOPRIM 800-160 MG PO TABS: 1.0000 | ORAL_TABLET | Freq: Two times a day (BID) | ORAL | Status: AC

## 2016-01-20 MED ORDER — HEPARIN SOD (PORK) LOCK FLUSH 100 UNIT/ML IV SOLN
500.0000 [IU] | Freq: Once | INTRAVENOUS | Status: AC
  Administered 2016-01-20: 500 [IU] via INTRAVENOUS
  Filled 2016-01-20: qty 5

## 2016-01-20 NOTE — Telephone Encounter (Signed)
Pt has been in hospital----  D/c today

## 2016-01-20 NOTE — Discharge Summary (Signed)
Physician Discharge Summary  [name] HGD:[id number] DOB: 02-Mar-1951 DOA: 01/14/2016  PCP: Jessica Held, DO  Admit date: 01/14/2016 Discharge date: 01/20/2016  Admitted From: Home Disposition:  Home  Recommendations for Outpatient Follow-up:  1. Follow up with PCP in 1-2 weeks. Completes antibiotics on 01/24/2016.  Home Health: None Equipment/Devices: None  Discharge Condition: Stable CODE STATUS: Full code Diet recommendation: Soft with thin liquid   Discharge Diagnoses:  Principal Problem:   Generalized abdominal pain   Active Problems:   Chronic pain syndrome   Stomach cancer (HCC)   Nausea   Uncontrolled type 2 diabetes mellitus with diabetic polyneuropathy, without long-term current use of insulin (HCC)   Diarrhea   Gastrointestinal dysmotility   CKD stage 2 due to type 2 diabetes mellitus (HCC)   Abscess of right thigh  Brief narrative/history of present illness 65 year old female with history of diabetes mellitus/GERD and history of stomach cancer in 2010 (status post partial gastrectomy with chemotherapy and radiation), hypertension, GERD who presented with one week duration of abdominal pain about 1 week prior to admission. She reports unable to eat for 4-5 days prior to admission. He was seen 2 weeks prior to admission by PCP and was concerned for an extended weight loss (20 pound weight loss). She denies recent antibiotic use reported diarrhea with 3 stools daily and some chills. ET of the abdomen and pelvis done without significant findings. Admitted for further management. [facility] consulted.  Hospital course  Principal problem Abdominal pain Abdominal pain appears nonspecific with possible gastric dysmotility. Stool for C. difficile and GI pathogen panel negative. Seen by [name] and underwent EGD showing normal esophagus, grade 1 hiatal hernia and patent gastrojejunostomy. Improved with IV hydration and slowly advancing diet. Added antispasmodics  and Reglan with clinical improvement. Stable to be discharged home with outpatient PCP follow-up and GI follow-up as needed.  Right thigh abscess Seen on admission. Status post I&D by surgery. Placed on oral Bactrim and will be discharged on it to complete 7 day course.  Type 2 diabetes mellitus  Stable. resume home medications  Chronic pain syndrome Resume home pain medication  History of gastric cancer S/p partial resection of right chemotherapy and radiation in 2010. Sees [name].  Hypokalemia Replenished  Discharge Instructions     Medication List    TAKE these medications        aspirin 81 MG tablet  Take 81 mg by mouth at bedtime.     carvedilol 12.5 MG tablet  Commonly known as:  COREG  TAKE 1 TABLET BY MOUTH BY EVERY MORNING     DULoxetine 60 MG capsule  Commonly known as:  CYMBALTA  Take 1 capsule (60 mg total) by mouth 2 (two) times daily.     EMBEDA 30-1.2 MG Cpcr  Generic drug:  Morphine-Naltrexone  TK ONE C PO ONCE D     glimepiride 4 MG tablet  Commonly known as:  AMARYL  TAKE 1 TABLET BY MOUTH TWICE A DAY     hydrochlorothiazide 25 MG tablet  Commonly known as:  HYDRODIURIL  TAKE 1 TABLET (25 MG TOTAL) BY MOUTH DAILY.     hyoscyamine 0.125 MG SL tablet  Commonly known as:  LEVSIN SL  Place 1 tablet (0.125 mg total) under the tongue every 6 (six) hours as needed (abdominal pain).     lidocaine-prilocaine cream  Commonly known as:  EMLA  Apply 1 application topically as needed.     LORazepam 0.5 MG tablet  Commonly known as:  ATIVAN  Take 1 tablet (0.5 mg total) by mouth at bedtime as needed for anxiety.     metaxalone 800 MG tablet  Commonly known as:  SKELAXIN  Take 1 tablet (800 mg total) by mouth 4 (four) times daily.     metFORMIN 850 MG tablet  Commonly known as:  GLUCOPHAGE  Take 1 tablet (850 mg total) by mouth 2 (two) times daily. Repeat labs are due now     metoCLOPramide 5 MG tablet  Commonly known as:  REGLAN  Take 1  tablet (5 mg total) by mouth every 8 (eight) hours as needed for nausea or vomiting.     morphine 15 MG tablet  Commonly known as:  MSIR  TK 1 T PO TID PRN     multivitamin with minerals Tabs tablet  Take 1 tablet by mouth daily.     ondansetron 4 MG disintegrating tablet  Commonly known as:  ZOFRAN ODT  Take 1 tablet (4 mg total) by mouth every 8 (eight) hours as needed for nausea or vomiting.     pantoprazole 40 MG tablet  Commonly known as:  PROTONIX  Take 1 tablet (40 mg total) by mouth daily.     sulfamethoxazole-trimethoprim 800-160 MG tablet  Commonly known as:  BACTRIM DS,SEPTRA DS  Take 1 tablet by mouth every 12 (twelve) hours.           Follow-up Information    Follow up with [facility] In 3 weeks.   Specialty:  General Surgery   Contact information:   [address] [place] [address] [phone]       Follow up with Jessica Held, DO. Schedule an appointment as soon as possible for a visit in 1 week.   Specialty:  Family Medicine   Contact information:   [address] [place] [address] [phone]      Allergies  Allergen Reactions  . Adhesive [Tape] Other (See Comments)    Burn Skin  . Codeine Other (See Comments)    paranoid  . Zocor [Simvastatin - High Dose] Other (See Comments)    Muscle spasms  . Penicillins Rash    Has patient had a PCN reaction causing immediate rash, facial/tongue/throat swelling, SOB or lightheadedness with hypotension: Yes Has patient had a PCN reaction causing severe rash involving mucus membranes or skin necrosis: No Has patient had a PCN reaction that required hospitalization does not remember.  Has patient had a PCN reaction occurring within the last 10 years: No If all of the above answers are "NO", then may proceed with Cephalosporin use.     Consultations:  [facility]   Procedures/Studies: Ct Angio Abdomen W/cm &/or Wo Contrast  01/13/2016  CLINICAL  DATA:  Generalized abdominal pain for the past week. One episode of diarrhea last night. Her history of gastric cancer diagnosed in 2010. Hypertension. Diabetes. Hyperlipidemia. EXAM: CT ANGIOGRAPHY ABDOMEN TECHNIQUE: Multidetector CT imaging of the abdomen was performed using the standard protocol during bolus administration of intravenous contrast. Multiplanar reconstructed images including MIPs were obtained and reviewed to evaluate the vascular anatomy. CONTRAST:  80 cc of Isovue 370 COMPARISON:  Plain films of 1 day prior.  CT of 01/04/2016. FINDINGS: Lower chest: Clear lung bases. Tiny hiatal hernia. Mild cardiomegaly, without pericardial or pleural effusion. Hepatobiliary: Marked hepatic steatosis with relatively mild hepatomegaly, 18.3 cm craniocaudal. Mild sparing in the posterior aspect of segment 4. Normal gallbladder,  without biliary ductal dilatation. Pancreas: Moderate pancreatic atrophy, without duct dilatation or dominant mass. Spleen: Normal in size, without focal abnormality. Adrenals/Urinary Tract: Normal adrenal glands. Renal scarring, worse on the left. Decreased perfusion and function involving the upper pole left kidney. No hydronephrosis. Degraded evaluation of the pelvis, secondary to beam hardening artifact from right hip arthroplasty. Urinary bladder not well evaluated. Stomach/Bowel: Surgical changes of partial gastrectomy. The proximal stomach is underdistended. No dominant gastric mass identified. Suspect prior gastrojejunostomy. Scattered colonic diverticula. Muscular hypertrophy involving the sigmoid. Underdistention throughout the majority of the colon. Normal small bowel. Vascular/Lymphatic: Aorta is normal in caliber. Atherosclerosis. celiac is widely patent. The superior mesenteric artery is within normal limits. The inferior mesenteric artery is patent proximally. No significant stenosis involving the renal arteries. The pelvic vasculature is not well opacified, but no aneurysm  is seen. No abdominal and no gross pelvic adenopathy. Reproductive: Normal imaged uterus. Other: No gross free pelvic fluid. Suspect prior ventral abdominal wall hernia repair, including on image 47/series 4. Musculoskeletal: Trace L4-5 anterolisthesis. Thoracolumbar spondylosis. Review of the MIP images confirms the above findings. IMPRESSION: 1. Atherosclerosis, without mesenteric stenosis or thrombosis. 2.  No acute process in the abdomen or pelvis. 3. Marked hepatic steatosis. 4. Small hiatal hernia. Partial gastrectomy with probable gastrojejunostomy. No evidence of metastatic disease. 5. Scarred kidneys, worse on the left. 6. Degraded evaluation of the pelvis, secondary to beam hardening artifact from right hip arthroplasty. Electronically Signed   By: Abigail Miyamoto M.D.   On: 01/13/2016 21:22   US Abdomen Complete  01/15/2016  CLINICAL DATA:  Abdominal pain x2 weeks.  Stomach carcinoma. EXAM: COMPLETE ABDOMINAL ULTRASOUND COMPARISON:  CT 01/13/2016 FINDINGS: Gallbladder: Physiologically distended without stones, wall thickening, or pericholecystic fluid. Sonographer reports no sonographic Murphy's sign. Common bile duct:  Normal in caliber, 5.75m diameter. Liver: Coarse echotexture without focal lesion or intrahepatic biliary ductal dilatation. IVC:  Negative Pancreas: Visualized segments unremarkable, portions obscured by overlying bowel gas. Spleen:  No focal lesion, craniocaudal 3.0cm in length. Right Kidney:  No mass or hydronephrosis, 11.6cm in length. Left Kidney:  No lesion or hydronephrosis, 10.1cm in length. Abdominal aorta:  Negative IMPRESSION: Negative.  Normal gallbladder. Electronically Signed   By: DLucrezia EuropeM.D.   On: 01/15/2016 17:06   Ct Abdomen Pelvis W Contrast  01/04/2016  CLINICAL DATA:  Periumbilical pain for 1 week, history of gastric cancer EXAM: CT ABDOMEN AND PELVIS WITH CONTRAST TECHNIQUE: Multidetector CT imaging of the abdomen and pelvis was performed using the standard  protocol following bolus administration of intravenous contrast. CONTRAST:  857mISOVUE-300 IOPAMIDOL (ISOVUE-300) INJECTION 61% COMPARISON:  10/16/2015 FINDINGS: Lung bases are free of acute infiltrate or sizable effusion. Diffuse fatty infiltration of the liver is seen. The gallbladder is well distended. The spleen, adrenal glands and pancreas are within normal limits. Thickening of the distal esophagus is noted which may be related to underlying reflux. Kidneys are well visualized bilaterally and again demonstrate some mild scarring. No obstructive changes are noted. Aortoiliac calcifications are seen. The appendix has been surgically removed. Diverticular changes noted within the colon without evidence of diverticulitis. No free pelvic fluid is seen. The bladder is partially distended. The uterus is within normal limits. Postsurgical changes are noted consistent with prior hip replacement on the right. Degenerative changes of lumbar spine are seen as well. Stable subcutaneous fluid collection is noted related to prior surgery. IMPRESSION: Diffuse fatty infiltration stable from the prior exam. Diverticulosis without diverticulitis. No acute abnormality is  identified Electronically Signed   By: Inez Catalina M.D.   On: 01/04/2016 21:04   Dg Abd Acute W/chest  01/13/2016  CLINICAL DATA:  Acute onset of diarrhea and generalized abdominal pain. Initial encounter. EXAM: DG ABDOMEN ACUTE W/ 1V CHEST COMPARISON:  Chest radiograph performed 11/23/2015, and CT of the abdomen and pelvis from 01/04/2016 FINDINGS: The lungs are well-aerated and clear. There is no evidence of focal opacification, pleural effusion or pneumothorax. The cardiomediastinal silhouette is within normal limits. A right-sided chest port is noted ending about the mid SVC. The visualized bowel gas pattern is unremarkable. Scattered stool and air are seen within the colon; there is no evidence of small bowel dilatation to suggest obstruction. No free  intra-abdominal air is identified on the provided upright view. No acute osseous abnormalities are seen; the sacroiliac joints are unremarkable in appearance. A right-sided hip arthroplasty is grossly unremarkable in appearance, without evidence of loosening, though incompletely imaged. IMPRESSION: 1. Unremarkable bowel gas pattern; no free intra-abdominal air seen. 2. No acute cardiopulmonary process seen. Electronically Signed   By: Garald Balding M.D.   On: 01/13/2016 00:25    EGD on 6/23 Normal esophagus. - 1 cm hiatal hernia. - Patent Billroth II gastrojejunostomy was found, characterized by healthy appearing mucosa.    Subjective: Patient denies further abdominal pain, nausea or vomiting. Tolerating diet.  Discharge Exam: Filed Vitals:   01/19/16 2045 01/20/16 0615  BP: 120/78 107/68  Pulse: 67 75  Temp: 99.1 F (37.3 C) 99.1 F (37.3 C)  Resp: 17 17   Filed Vitals:   01/19/16 0557 01/19/16 1320 01/19/16 2045 01/20/16 0615  BP: 136/83 111/85 120/78 107/68  Pulse: 66 73 67 75  Temp: 98.2 F (36.8 C) 99 F (37.2 C) 99.1 F (37.3 C) 99.1 F (37.3 C)  TempSrc: Oral Oral Oral Oral  Resp: 17 16 17 17   Height:      Weight: 106.6 kg (235 lb 0.2 oz)   105.4 kg (232 lb 5.8 oz)  SpO2: 100% 100% 92% 100%    General: not in acute distress HEENT: No pallor, moist mucosa, supple neck Cardiovascular: RRR, S1/S2 +, no rubs, no gallops Respiratory: CTA bilaterally, no wheezing, no rhonchi Abdominal: Soft, NT, ND, bowel sounds + Extremities: no edema, no cyanosis     The results of significant diagnostics from this hospitalization (including imaging, microbiology, ancillary and laboratory) are listed below for reference.     Microbiology: No results found for this or any previous visit (from the past 240 hour(s)).   Labs: BNP (last 3 results) No results for input(s): BNP in the last 8760 hours. Basic Metabolic Panel:  Recent Labs Lab 01/13/16 1940 01/14/16 2009  01/15/16 0630 01/17/16 1120 01/20/16 0810  NA 139 140 142 139 140  K 4.9 3.5 3.1* 4.1 3.5  CL 103 104 109 108 106  CO2 23 23 26 28 27   GLUCOSE 212* 177* 88 112* 113*  BUN 11 8 6  <5* <5*  CREATININE 1.43* 1.37* 1.15* 1.25* 1.35*  CALCIUM 8.2* 8.3* 7.8* 8.2* 8.6*   Liver Function Tests:  Recent Labs Lab 01/13/16 1940 01/14/16 2009  AST 62* 58*  ALT 47 46  ALKPHOS 72 72  BILITOT 1.2 1.3*  PROT 5.9* 6.0*  ALBUMIN 2.5* 2.6*    Recent Labs Lab 01/13/16 1940 01/14/16 2009  LIPASE 12 14   No results for input(s): AMMONIA in the last 168 hours. CBC:  Recent Labs Lab 01/13/16 1927 01/14/16 2009 01/15/16 0630  WBC 8.6 9.4 10.1  NEUTROABS 5.3 4.8  --   HGB 13.0 12.0 11.1*  HCT 37.3 33.1* 31.5*  MCV 85.0 83.0 84.5  PLT 388 298 296   Cardiac Enzymes: No results for input(s): CKTOTAL, CKMB, CKMBINDEX, TROPONINI in the last 168 hours. BNP: Invalid input(s): POCBNP CBG:  Recent Labs Lab 01/19/16 2012 01/20/16 0008 01/20/16 0404 01/20/16 0734 01/20/16 1256  GLUCAP 131* 171* 115* 104* 144*   D-Dimer No results for input(s): DDIMER in the last 72 hours. Hgb A1c No results for input(s): HGBA1C in the last 72 hours. Lipid Profile No results for input(s): CHOL, HDL, LDLCALC, TRIG, CHOLHDL, LDLDIRECT in the last 72 hours. Thyroid function studies No results for input(s): TSH, T4TOTAL, T3FREE, THYROIDAB in the last 72 hours.  Invalid input(s): FREET3 Anemia work up No results for input(s): VITAMINB12, FOLATE, FERRITIN, TIBC, IRON, RETICCTPCT in the last 72 hours. Urinalysis    Component Value Date/Time   COLORURINE YELLOW 01/14/2016 2023   APPEARANCEUR CLOUDY* 01/14/2016 2023   LABSPEC 1.025 01/14/2016 2023   LABSPEC 1.015 04/06/2015 0939   PHURINE 6.0 01/14/2016 2023   GLUCOSEU NEGATIVE 01/14/2016 2023   HGBUR NEGATIVE 01/14/2016 2023   BILIRUBINUR MODERATE* 01/14/2016 2023   BILIRUBINUR 2+ 01/04/2016 1619   KETONESUR 40* 01/14/2016 2023   PROTEINUR  NEGATIVE 01/14/2016 2023   PROTEINUR trace 01/04/2016 1619   UROBILINOGEN 1.0 01/04/2016 1619   UROBILINOGEN 0.2 04/06/2015 0939   UROBILINOGEN 1.0 12/09/2013 1655   NITRITE NEGATIVE 01/14/2016 2023   NITRITE neg 01/04/2016 1619   LEUKOCYTESUR SMALL* 01/14/2016 2023   Sepsis Labs Invalid input(s): PROCALCITONIN,  WBC,  LACTICIDVEN Microbiology No results found for this or any previous visit (from the past 240 hour(s)).   Time coordinating discharge: Over 30 minutes  SIGNED:   Louellen Molder, MD  [facility] 01/20/2016, 1:36 PM Pager   If 7PM-7AM, please contact night-coverage [email or web address] Password [email or web address]

## 2016-01-21 ENCOUNTER — Telehealth: Payer: Self-pay

## 2016-01-21 NOTE — Telephone Encounter (Signed)
Transition Care Management Follow-up Telephone Call  ADMISSION DATE: 01/14/16  DISCHARGE DATE: 01/20/16    How have you been since you were released from the hospital? Patient states she feels tired but has not had pain. States she has SOB if up and about.     Do you understand why you were in the hospital? Yes per patient.   Do you understand the discharge instrcutions? Yes per patient.  Items Reviewed:  Medications reviewed: Patient refused to review medications. States she didn't feel up to going over that many medications.   Allergies reviewed: Reviewed medication allergies with patient.  Dietary changes reviewed:Yes, Soft with thin liquid  Referrals reviewed: Attempted to schedule hospital follow up visit with patient. States she will have to talk with her daughter to see when she can bring her in for appointment. Agreed to call back to schedule.   Functional Questionnaire:   Activities of Daily Living (ADLs):  Patient states she can perform ADL's with assistance. Any transportation issues/concerns?: Patient dependent on daughter for transportation.    Any patient concerns? Patient states she is concerned with getting better and feeling better.   Confirmed importance and date/time of follow-up visits scheduled: Yes                                                                                                 Confirmed with patient if condition begins to worsen call PCP or go to the ER. Yes, patient agreed.    Patient was given the Call-a-Nurse line [phone]: Yes patient states she does have this information in her paperwork.

## 2016-01-21 NOTE — Telephone Encounter (Signed)
error 

## 2016-01-23 IMAGING — MR MR MRA HEAD W/O CM
1 series · 19 of 48 positions shown · non-contrast
Comparison: MR brain 05/03/2014.  MR brain 08/09/2014.

CLINICAL DATA: Previous brainstem stroke April 2014. Continued
dizziness, with symptoms for 6 months. History of gastric cancer.
History of diabetes. Initial encounter.

EXAM:
MRA HEAD WITHOUT CONTRAST
TECHNIQUE: Angiographic images of the Circle of Willis were obtained using MRA
technique without intravenous contrast.

[Series 2: tof_3d_multi-slab · axial · 0.5mm · 0.35mm/px · z∈[-62,+29]mm · 19 of 191 slices shown]
[im 1/191]
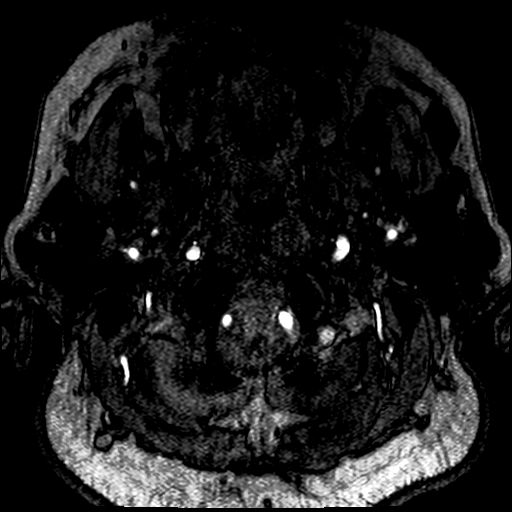
[im 5/191]
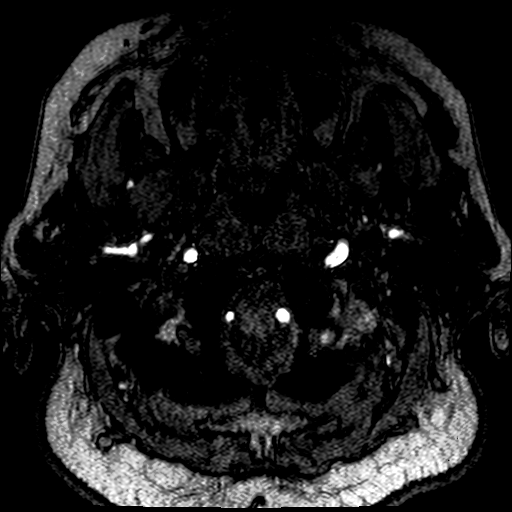
[im 9/191]
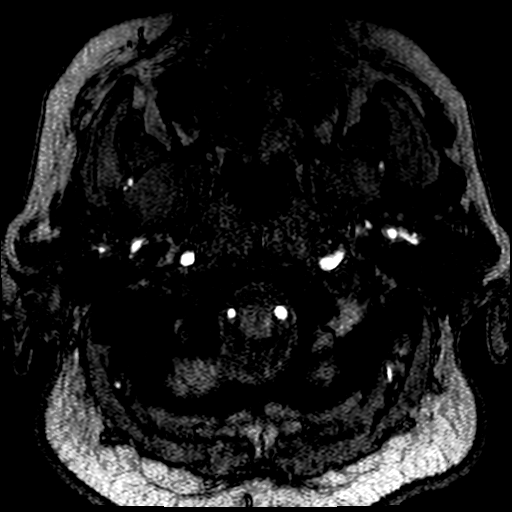
[im 13/191]
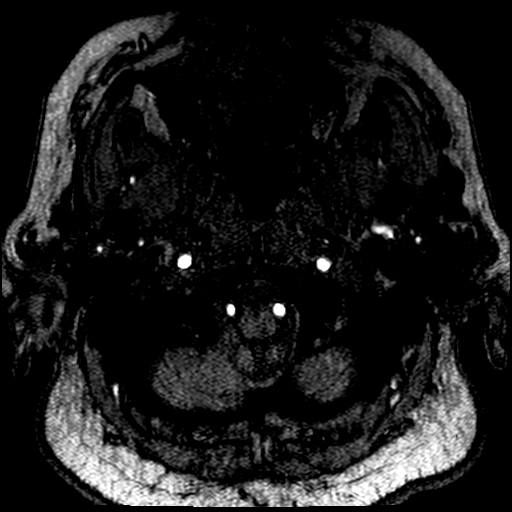
[im 17/191]
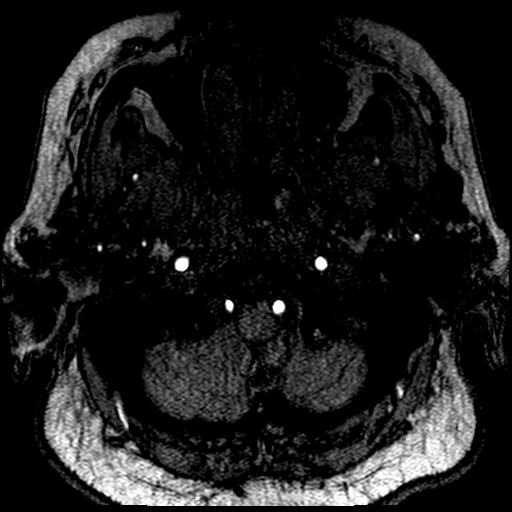
[im 21/191]
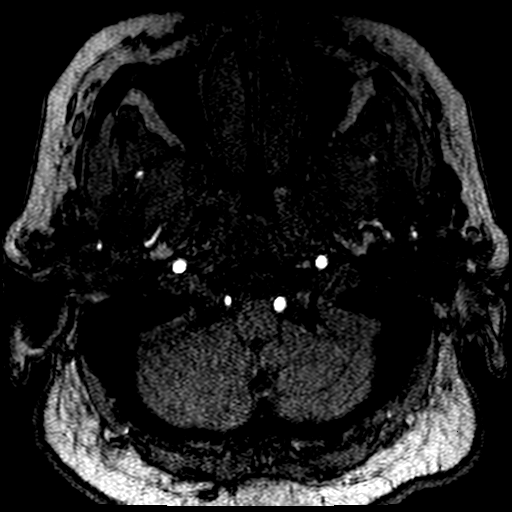
[im 25/191]
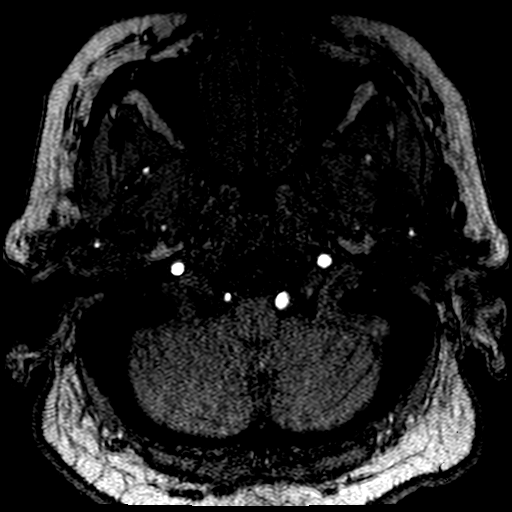
[im 29/191]
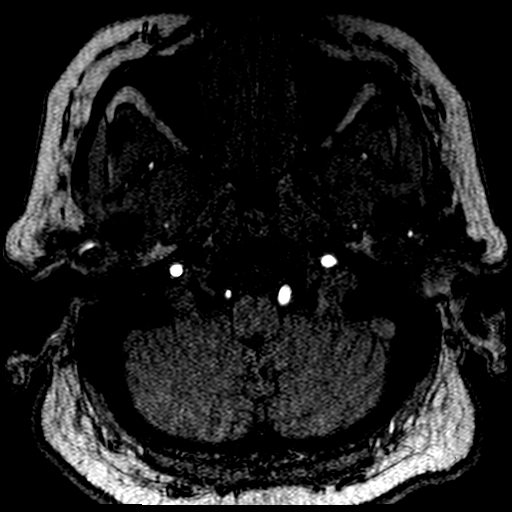
[im 33/191]
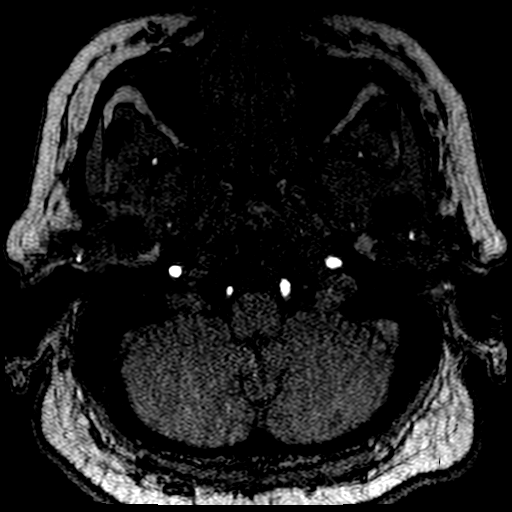
[im 37/191]
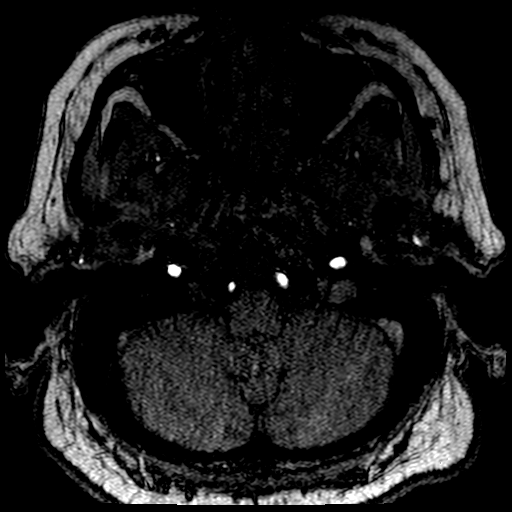
[im 41/191]
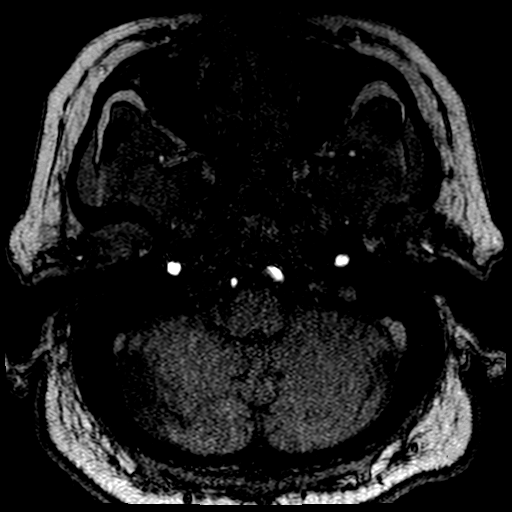
[im 61/191]
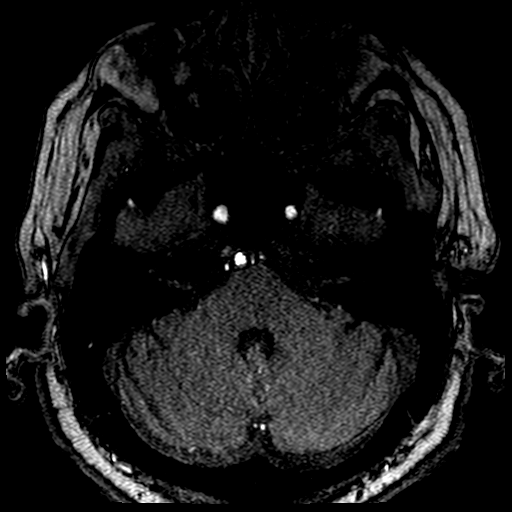
[im 85/191]
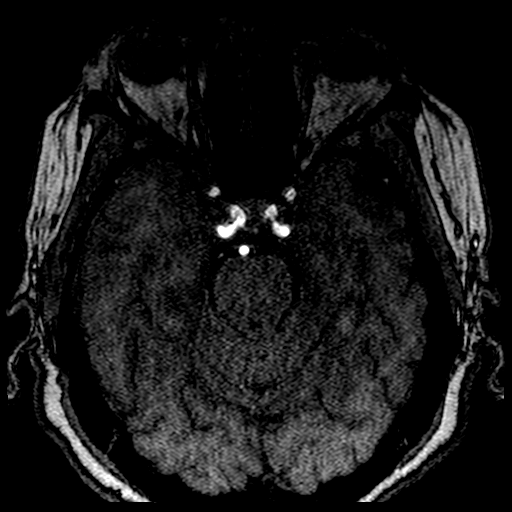
[im 98/191]
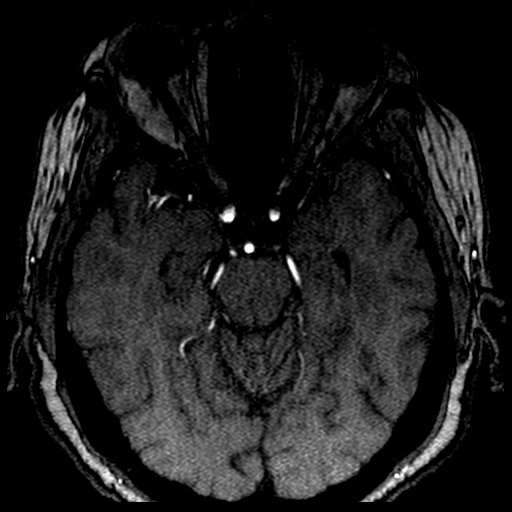
[im 110/191]
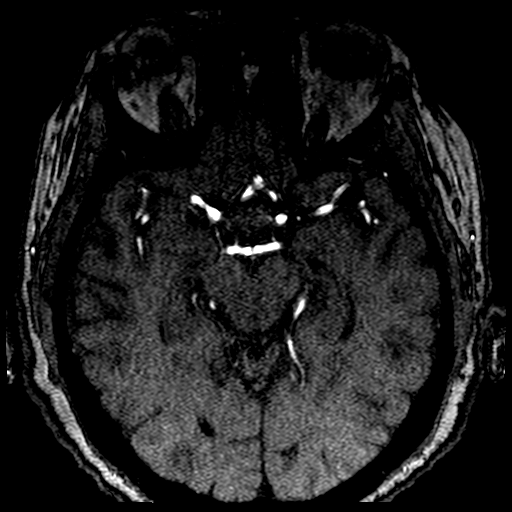
[im 134/191]
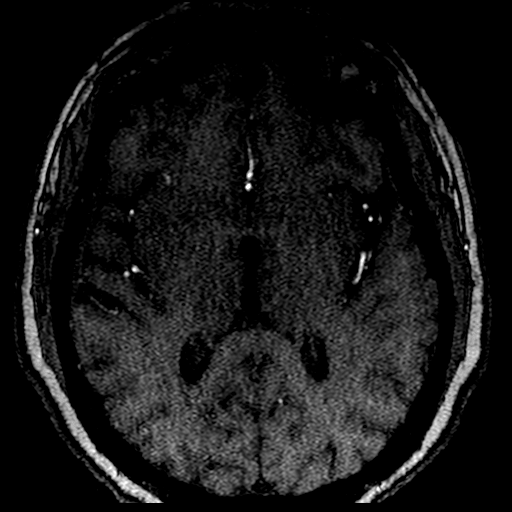
[im 158/191]
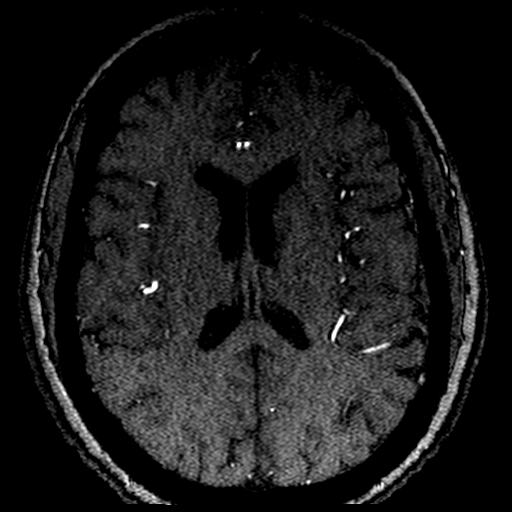
[im 162/191]
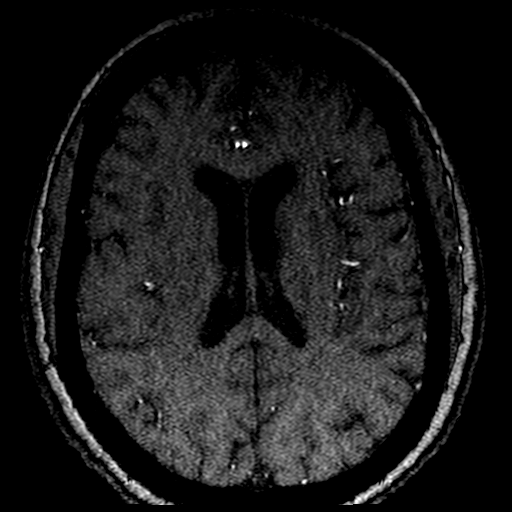
[im 182/191]
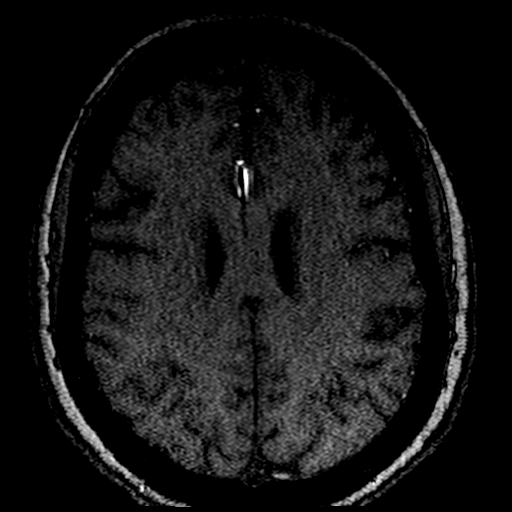

[19 of 48 positions shown; findings below may reference images not displayed]

FINDINGS: Dolichoectatic internal carotid arteries siphons display artifactual
signal loss due to tortuosity. No internal carotid artery stenosis
or dissection.

Basilar artery widely patent. Both vertebrals contribute with LEFT
vertebral dominant.

No focal stenosis of the anterior, middle, or posterior cerebral
arteries. No cerebellar branch occlusion. No intracranial aneurysm.

Please note, no conventional MR imaging was performed as part of the
magnetic resonance angiogram technique. If concern for new posterior
circulation insult, MRI brain without contrast recommended.
IMPRESSION: MR angiography of the intracranial circulation is within normal
limits. Specifically, the basilar artery is widely patent, and no
significant vertebral stenosis.

The previously observed brainstem infarction was likely related to
diabetic small vessel disease.

## 2016-01-25 ENCOUNTER — Encounter: Payer: Self-pay | Admitting: Medical

## 2016-01-25 ENCOUNTER — Ambulatory Visit (INDEPENDENT_AMBULATORY_CARE_PROVIDER_SITE_OTHER): Payer: Medicare HMO | Admitting: Medical

## 2016-01-25 VITALS — BP 101/80 | HR 113 | Temp 98.8°F | Ht 67.0 in | Wt 225.2 lb

## 2016-01-25 DIAGNOSIS — R1084 Generalized abdominal pain: Secondary | ICD-10-CM | POA: Diagnosis not present

## 2016-01-25 DIAGNOSIS — R112 Nausea with vomiting, unspecified: Secondary | ICD-10-CM

## 2016-01-25 LAB — CBC WITH DIFFERENTIAL/PLATELET
BASOS PCT: 0 %
Basophils Absolute: 0 cells/uL (ref 0–200)
Eosinophils Absolute: 423 cells/uL (ref 15–500)
Eosinophils Relative: 3 %
HEMATOCRIT: 40.3 % (ref 35.0–45.0)
Hemoglobin: 13.2 g/dL (ref 11.7–15.5)
LYMPHS ABS: 2538 {cells}/uL (ref 850–3900)
Lymphocytes Relative: 18 %
MCH: 30.1 pg (ref 27.0–33.0)
MCHC: 32.8 g/dL (ref 32.0–36.0)
MCV: 91.8 fL (ref 80.0–100.0)
MONO ABS: 1128 {cells}/uL — AB (ref 200–950)
MPV: 10.5 fL (ref 7.5–12.5)
Monocytes Relative: 8 %
NEUTROS ABS: 10011 {cells}/uL — AB (ref 1500–7800)
NEUTROS PCT: 71 %
Platelets: 357 10*3/uL (ref 140–400)
RBC: 4.39 MIL/uL (ref 3.80–5.10)
RDW: 15.1 % — ABNORMAL HIGH (ref 11.0–15.0)
WBC: 14.1 10*3/uL — AB (ref 3.8–10.8)

## 2016-01-25 LAB — COMPREHENSIVE METABOLIC PANEL
ALBUMIN: 3 g/dL — AB (ref 3.6–5.1)
ALK PHOS: 98 U/L (ref 33–130)
ALT: 44 U/L — AB (ref 6–29)
AST: 43 U/L — AB (ref 10–35)
BUN: 18 mg/dL (ref 7–25)
CALCIUM: 9.1 mg/dL (ref 8.6–10.4)
CHLORIDE: 103 mmol/L (ref 98–110)
CO2: 24 mmol/L (ref 20–31)
Creat: 1.57 mg/dL — ABNORMAL HIGH (ref 0.50–0.99)
Glucose, Bld: 281 mg/dL — ABNORMAL HIGH (ref 65–99)
POTASSIUM: 4.7 mmol/L (ref 3.5–5.3)
SODIUM: 140 mmol/L (ref 135–146)
Total Bilirubin: 1.1 mg/dL (ref 0.2–1.2)
Total Protein: 6.2 g/dL (ref 6.1–8.1)

## 2016-01-25 LAB — AMYLASE: Amylase: 30 U/L (ref 0–105)

## 2016-01-25 LAB — LIPASE: Lipase: 5 U/L — ABNORMAL LOW (ref 7–60)

## 2016-01-25 MED ORDER — ONDANSETRON 8 MG PO TBDP: 8.0000 mg | ORAL_TABLET | Freq: Three times a day (TID) | ORAL | Status: DC | PRN

## 2016-01-25 MED ORDER — METOCLOPRAMIDE HCL 5 MG PO TABS: 5.0000 mg | ORAL_TABLET | Freq: Three times a day (TID) | ORAL | Status: DC | PRN

## 2016-01-25 NOTE — Progress Notes (Signed)
Pre visit review using our clinic review tool, if applicable. No additional management support is needed unless otherwise documented below in the visit note. 

## 2016-01-25 NOTE — Patient Instructions (Addendum)
For your abdomen pain will rx reglan. This helped per your report so I am refilling.  For your nausea and vomiting prescribing the zofran.  We will update you on labs when those are in. If you get worsening left lower quadrant pain then ED. If the prior pain you had in abdomen not improving then may consider getting ct abdomen/pelvis on wed.  See GI in august as scheduled.   I check your upper thigh abscess. Infection today and that area looks resolved now.  Follow up in 10 days or as needed

## 2016-01-25 NOTE — Progress Notes (Signed)
Subjective:    Patient ID: [name], female    DOB: 1951/05/17, 65 y.o.   MRN: [id number]  HPI   Pt in for hospital follow up. She was initially evaluated for abdomen pain. Pt not aware of her diagnosis. Seen by [facility]. She was given some Reglan after hospitalization. She feels like it did help. She ran out of the Reglan(pain returned when ran out of med). Describes nausea sensation constant. When she was on reglan most hospital she felt better. Pt ran out of reglan about 2 days ago(vomited twice since dc from hospital)  Pt also had abscess been on bactrim for thigh abscess. She finished bactrim and are looks resolved on her rt thigh.   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  Pt on discharge from hosptial told to follow up with General surgeon. Pt has Billroth II gastrojejunostomy   Pt states some pain in left lower quadrant since last visit bith [name]. Pt had some scattered diverticula but no diverticulitis reported. See 01-13-2016 report. Pt states appointment with Dr. Ardis Hughs on 16th of August. Pt thinks last colonoscopy was on 16th.  Pt has diabetes for 25 years. Pt may have gastroparesis.(she states told slow digestive system).     Review of Systems  Constitutional: Negative for fever, chills and fatigue.  Respiratory: Negative for cough and chest tightness.   Cardiovascular: Negative for chest pain and palpitations.  Gastrointestinal: Positive for nausea, vomiting and abdominal pain. Negative for diarrhea, constipation, blood in stool and abdominal distention.       Vomited twice since last Friday.  Musculoskeletal: Negative for back pain.  Skin: Negative for rash.  Neurological: Negative for dizziness and headaches.  Hematological: Negative for adenopathy. Does not bruise/bleed easily.    Past Medical History  Diagnosis Date  . stomach ca dx'd 2010    chemo/xrt comp 05/2009  . Hypertension     actually taking for DM renal protection, not DM  .  Diabetes mellitus   . Asthma   . Sleep apnea   . Degenerative joint disease   . Hyperlipidemia     no longer on medication for this  . GERD (gastroesophageal reflux disease)   . Neuropathy ([place])     bilateral hands and feet  . Sickle cell trait [facility])      Social History   Social History  . Marital Status: Divorced    Spouse Name: N/A  . Number of Children: N/A  . Years of Education: N/A   Occupational History  . Not on file.   Social History Main Topics  . Smoking status: Former Smoker -- 1.00 packs/day for 15 years    Types: Cigarettes    Start date: 09/27/1973    Quit date: 10/15/1988  . Smokeless tobacco: Never Used     Comment: quit smoking 14 years ago  . Alcohol Use: No  . Drug Use: No  . Sexual Activity: No   Other Topics Concern  . Not on file   Social History Narrative    Past Surgical History  Procedure Laterality Date  . Total knee arthroplasty      Left  . Total hip arthroplasty      Right  . Shoulder surgery      Left  . Appendectomy    . Cesarean section    . Hernia repair    . Colon surgery    . Esophagogastroduodenoscopy N/A 10/15/2012    Procedure: ESOPHAGOGASTRODUODENOSCOPY (EGD);  Surgeon:  Lear Ng, MD;  Location: [facility];  Service: Endoscopy;  Laterality: N/A;  . Esophagogastroduodenoscopy N/A 01/15/2016    Procedure: ESOPHAGOGASTRODUODENOSCOPY (EGD);  Surgeon: Ronald Lobo, MD;  Location: [facility];  Service: Endoscopy;  Laterality: N/A;    Family History  Problem Relation Age of Onset  . Cancer Mother 3  . Alzheimer's disease Father 32  . Diabetes Brother   . Diabetes Maternal Grandmother   . Cancer Maternal Grandfather     lung  . Suicidality Neg Hx   . Depression Neg Hx   . Dementia Neg Hx   . Anxiety disorder Neg Hx     Allergies  Allergen Reactions  . Adhesive [Tape] Other (See Comments)    Burn Skin  . Codeine Other (See Comments)    paranoid  . Zocor [Simvastatin - High Dose] Other (See  Comments)    Muscle spasms  . Penicillins Rash    Has patient had a PCN reaction causing immediate rash, facial/tongue/throat swelling, SOB or lightheadedness with hypotension: Yes Has patient had a PCN reaction causing severe rash involving mucus membranes or skin necrosis: No Has patient had a PCN reaction that required hospitalization does not remember.  Has patient had a PCN reaction occurring within the last 10 years: No If all of the above answers are "NO", then may proceed with Cephalosporin use.     Current Outpatient Prescriptions on File Prior to Visit  Medication Sig Dispense Refill  . aspirin 81 MG tablet Take 81 mg by mouth at bedtime.    . carvedilol (COREG) 12.5 MG tablet TAKE 1 TABLET BY MOUTH BY EVERY MORNING 90 tablet 0  . DULoxetine (CYMBALTA) 60 MG capsule Take 1 capsule (60 mg total) by mouth 2 (two) times daily. 180 capsule 3  . EMBEDA 30-1.2 MG CPCR TK ONE C PO ONCE D  0  . glimepiride (AMARYL) 4 MG tablet TAKE 1 TABLET BY MOUTH TWICE A DAY 180 tablet 1  . hydrochlorothiazide (HYDRODIURIL) 25 MG tablet TAKE 1 TABLET (25 MG TOTAL) BY MOUTH DAILY. 90 tablet 0  . hyoscyamine (LEVSIN SL) 0.125 MG SL tablet Place 1 tablet (0.125 mg total) under the tongue every 6 (six) hours as needed (abdominal pain). 30 tablet 0  . lidocaine-prilocaine (EMLA) cream Apply 1 application topically as needed. 30 g 5  . LORazepam (ATIVAN) 0.5 MG tablet Take 1 tablet (0.5 mg total) by mouth at bedtime as needed for anxiety. 30 tablet 0  . metaxalone (SKELAXIN) 800 MG tablet Take 1 tablet (800 mg total) by mouth 4 (four) times daily. (Patient taking differently: Take 800 mg by mouth 4 (four) times daily as needed for muscle spasms. ) 30 tablet 0  . metFORMIN (GLUCOPHAGE) 850 MG tablet Take 1 tablet (850 mg total) by mouth 2 (two) times daily. Repeat labs are due now 180 tablet 1  . metoCLOPramide (REGLAN) 5 MG tablet Take 1 tablet (5 mg total) by mouth every 8 (eight) hours as needed for nausea  or vomiting. 15 tablet 0  . morphine (MSIR) 15 MG tablet TK 1 T PO TID PRN  0  . Multiple Vitamin (MULTIVITAMIN WITH MINERALS) TABS tablet Take 1 tablet by mouth daily.    . ondansetron (ZOFRAN ODT) 4 MG disintegrating tablet Take 1 tablet (4 mg total) by mouth every 8 (eight) hours as needed for nausea or vomiting. 20 tablet 0  . pantoprazole (PROTONIX) 40 MG tablet Take 1 tablet (40 mg total) by mouth daily. 30 tablet 0  No current facility-administered medications on file prior to visit.    BP 101/80 mmHg  Pulse 113  Temp(Src) 98.8 F (37.1 C) (Oral)  Ht 5' 7"  (1.702 m)  Wt 225 lb 3.2 oz (102.15 kg)  BMI 35.26 kg/m2  SpO2 100%       Objective:   Physical Exam   General Appearance- Not in acute distress.  HEENT Eyes- Scleraeral/Conjuntiva-bilat- Not Yellow. Mouth & Throat- Normal.  Chest and Lung Exam Auscultation: Breath sounds:-Normal. Adventitious sounds:- No Adventitious sounds.  Cardiovascular Auscultation:Rythm - Regular. Heart Sounds -Normal heart sounds.  Abdomen Inspection:-Inspection Normal.  Palpation/Perucssion: Palpation and Percussion of the abdomen reveal- faint epigastric and left lower  Tender, No Rebound tenderness, No rigidity(Guarding) and No Palpable abdominal masses.  Liver:-Normal.  Spleen:- Normal.   Rt upper thigh- prior infection looks cleared now.Only scar present.       Assessment & Plan:  For your abdomen pain will rx reglan. This helped per your report so I am refilling.  For your nausea and vomiting prescribing the zofran.  We will update you on labs when those are in. If you get worsening left lower quadrant pain then ED. If the prior pain you had in abdomen not improving then may consider getting ct abdomen/pelvis on wed.  See GI in august as scheduled.   I check your upper thigh abscess. Infection today and that area looks resolved now.  Follow up in 10 days or as needed   Rare side effect rx advisement given on  reglan discussed.

## 2016-01-26 MED ORDER — CIPROFLOXACIN HCL 500 MG PO TABS: 500.0000 mg | ORAL_TABLET | Freq: Two times a day (BID) | ORAL | Status: DC

## 2016-01-26 MED ORDER — METRONIDAZOLE 500 MG PO TABS: 500.0000 mg | ORAL_TABLET | Freq: Three times a day (TID) | ORAL | Status: DC

## 2016-01-26 NOTE — Telephone Encounter (Signed)
Let pt know I sent in cipro and flagyl for her abdomen pain. Again if left lower quadrant region pain present/worse then will order ct abdomen and pelvis. So please let me know what she says. If pt is on any other antibiotic have her stop. I think while she was in hospital she may have been on clindamycin.

## 2016-01-27 NOTE — Telephone Encounter (Signed)
Called and left a message for call back  

## 2016-01-28 NOTE — Addendum Note (Signed)
Addended by: Tasia Catchings on: 01/28/2016 07:59 PM   Modules accepted: Miquel Dunn

## 2016-01-29 ENCOUNTER — Telehealth: Payer: Self-pay

## 2016-01-29 NOTE — Telephone Encounter (Signed)
Patient is on the list for [employer] 2017 and may be a good candidate for an AWV in 2017. Please let me know if/when appt is scheduled.   Note: due around August.

## 2016-02-02 ENCOUNTER — Other Ambulatory Visit: Payer: Self-pay

## 2016-02-02 NOTE — Telephone Encounter (Signed)
Called to follow up with patient.  Pt states she feeling somewhat better, but is still experiencing abdominal pain and nausea/vomiting.  States pain located in the middle of her stomach and "it just aches."  Last episode of vomiting was yesterday morning.   Last BM:  2 days ago (typically goes every other day).  No changes to stool color, odor or consistency noted.  She's eating very little, but is drinking fluids.  She has appt with GI in August.  She would like to follow up with Percell Miller.  Appt scheduled with Percell Miller for tomorrow (02/03/16) at 3 pm.

## 2016-02-03 ENCOUNTER — Encounter (INDEPENDENT_AMBULATORY_CARE_PROVIDER_SITE_OTHER): Payer: Medicare HMO | Admitting: Medical

## 2016-02-03 ENCOUNTER — Encounter (HOSPITAL_BASED_OUTPATIENT_CLINIC_OR_DEPARTMENT_OTHER): Payer: Self-pay

## 2016-02-03 ENCOUNTER — Observation Stay (HOSPITAL_BASED_OUTPATIENT_CLINIC_OR_DEPARTMENT_OTHER)
Admission: EM | Admit: 2016-02-03 | Discharge: 2016-02-04 | Disposition: A | Discharge status: Home / Self Care | Payer: Medicare HMO | Attending: Internal Medicine | Admitting: Internal Medicine

## 2016-02-03 ENCOUNTER — Emergency Department (HOSPITAL_BASED_OUTPATIENT_CLINIC_OR_DEPARTMENT_OTHER): Payer: Medicare HMO

## 2016-02-03 DIAGNOSIS — I959 Hypotension, unspecified: Secondary | ICD-10-CM | POA: Diagnosis present

## 2016-02-03 DIAGNOSIS — Z7982 Long term (current) use of aspirin: Secondary | ICD-10-CM | POA: Insufficient documentation

## 2016-02-03 DIAGNOSIS — K219 Gastro-esophageal reflux disease without esophagitis: Secondary | ICD-10-CM | POA: Diagnosis present

## 2016-02-03 DIAGNOSIS — N179 Acute kidney failure, unspecified: Secondary | ICD-10-CM | POA: Diagnosis present

## 2016-02-03 DIAGNOSIS — Z79899 Other long term (current) drug therapy: Secondary | ICD-10-CM | POA: Insufficient documentation

## 2016-02-03 DIAGNOSIS — Z85028 Personal history of other malignant neoplasm of stomach: Secondary | ICD-10-CM | POA: Diagnosis not present

## 2016-02-03 DIAGNOSIS — E1122 Type 2 diabetes mellitus with diabetic chronic kidney disease: Secondary | ICD-10-CM | POA: Diagnosis not present

## 2016-02-03 DIAGNOSIS — N183 Acute kidney failure, unspecified: Secondary | ICD-10-CM | POA: Diagnosis present

## 2016-02-03 DIAGNOSIS — Z7984 Long term (current) use of oral hypoglycemic drugs: Secondary | ICD-10-CM | POA: Diagnosis not present

## 2016-02-03 DIAGNOSIS — E1140 Type 2 diabetes mellitus with diabetic neuropathy, unspecified: Secondary | ICD-10-CM | POA: Diagnosis not present

## 2016-02-03 DIAGNOSIS — F331 Major depressive disorder, recurrent, moderate: Secondary | ICD-10-CM | POA: Diagnosis present

## 2016-02-03 DIAGNOSIS — R55 Syncope and collapse: Secondary | ICD-10-CM | POA: Diagnosis not present

## 2016-02-03 DIAGNOSIS — N189 Chronic kidney disease, unspecified: Secondary | ICD-10-CM | POA: Diagnosis not present

## 2016-02-03 DIAGNOSIS — J45909 Unspecified asthma, uncomplicated: Secondary | ICD-10-CM | POA: Diagnosis not present

## 2016-02-03 DIAGNOSIS — Z6835 Body mass index (BMI) 35.0-35.9, adult: Secondary | ICD-10-CM | POA: Diagnosis not present

## 2016-02-03 DIAGNOSIS — I10 Essential (primary) hypertension: Secondary | ICD-10-CM

## 2016-02-03 DIAGNOSIS — G40301 Generalized idiopathic epilepsy and epileptic syndromes, not intractable, with status epilepticus: Secondary | ICD-10-CM | POA: Diagnosis present

## 2016-02-03 DIAGNOSIS — E785 Hyperlipidemia, unspecified: Secondary | ICD-10-CM | POA: Diagnosis present

## 2016-02-03 DIAGNOSIS — Z96641 Presence of right artificial hip joint: Secondary | ICD-10-CM | POA: Insufficient documentation

## 2016-02-03 DIAGNOSIS — Z96652 Presence of left artificial knee joint: Secondary | ICD-10-CM | POA: Insufficient documentation

## 2016-02-03 DIAGNOSIS — Z87891 Personal history of nicotine dependence: Secondary | ICD-10-CM | POA: Insufficient documentation

## 2016-02-03 DIAGNOSIS — Z903 Acquired absence of stomach [part of]: Secondary | ICD-10-CM | POA: Insufficient documentation

## 2016-02-03 DIAGNOSIS — Z9221 Personal history of antineoplastic chemotherapy: Secondary | ICD-10-CM | POA: Insufficient documentation

## 2016-02-03 DIAGNOSIS — D573 Sickle-cell trait: Secondary | ICD-10-CM | POA: Insufficient documentation

## 2016-02-03 DIAGNOSIS — G4733 Obstructive sleep apnea (adult) (pediatric): Secondary | ICD-10-CM | POA: Insufficient documentation

## 2016-02-03 DIAGNOSIS — I129 Hypertensive chronic kidney disease with stage 1 through stage 4 chronic kidney disease, or unspecified chronic kidney disease: Secondary | ICD-10-CM | POA: Diagnosis not present

## 2016-02-03 LAB — CBC
HCT: 35.6 % — ABNORMAL LOW (ref 36.0–46.0)
HEMOGLOBIN: 12.4 g/dL (ref 12.0–15.0)
MCH: 30.5 pg (ref 26.0–34.0)
MCHC: 34.8 g/dL (ref 30.0–36.0)
MCV: 87.5 fL (ref 78.0–100.0)
Platelets: 332 10*3/uL (ref 150–400)
RBC: 4.07 MIL/uL (ref 3.87–5.11)
RDW: 15.9 % — AB (ref 11.5–15.5)
WBC: 7.6 10*3/uL (ref 4.0–10.5)

## 2016-02-03 LAB — HEPATIC FUNCTION PANEL
ALBUMIN: 2.8 g/dL — AB (ref 3.5–5.0)
ALK PHOS: 77 U/L (ref 38–126)
ALT: 43 U/L (ref 14–54)
AST: 63 U/L — AB (ref 15–41)
BILIRUBIN TOTAL: 0.6 mg/dL (ref 0.3–1.2)
Bilirubin, Direct: 0.2 mg/dL (ref 0.1–0.5)
Indirect Bilirubin: 0.4 mg/dL (ref 0.3–0.9)
Total Protein: 6.1 g/dL — ABNORMAL LOW (ref 6.5–8.1)

## 2016-02-03 LAB — BASIC METABOLIC PANEL
ANION GAP: 8 (ref 5–15)
BUN: 11 mg/dL (ref 6–20)
CALCIUM: 8.5 mg/dL — AB (ref 8.9–10.3)
CO2: 28 mmol/L (ref 22–32)
Chloride: 102 mmol/L (ref 101–111)
Creatinine, Ser: 1.96 mg/dL — ABNORMAL HIGH (ref 0.44–1.00)
GFR, EST AFRICAN AMERICAN: 30 mL/min — AB (ref >60)
GFR, EST NON AFRICAN AMERICAN: 26 mL/min — AB (ref >60)
Glucose, Bld: 241 mg/dL — ABNORMAL HIGH (ref 65–99)
Potassium: 4.3 mmol/L (ref 3.5–5.1)
SODIUM: 138 mmol/L (ref 135–145)

## 2016-02-03 LAB — D-DIMER, QUANTITATIVE: D-Dimer, Quant: 0.85 ug/mL-FEU — ABNORMAL HIGH (ref 0.00–0.50)

## 2016-02-03 LAB — TROPONIN I: Troponin I: <0.03 ng/mL (ref <0.03)

## 2016-02-03 LAB — PHOSPHORUS: Phosphorus: 3.6 mg/dL (ref 2.5–4.6)

## 2016-02-03 LAB — MAGNESIUM: Magnesium: 1.2 mg/dL — ABNORMAL LOW (ref 1.7–2.4)

## 2016-02-03 LAB — LIPASE, BLOOD

## 2016-02-03 MED ORDER — SODIUM CHLORIDE 0.9 % IV SOLN
INTRAVENOUS | Status: DC
  Administered 2016-02-04 (×3): via INTRAVENOUS

## 2016-02-03 MED ORDER — SODIUM CHLORIDE 0.9% FLUSH
3.0000 mL | Freq: Two times a day (BID) | INTRAVENOUS | Status: DC
  Administered 2016-02-03: 3 mL via INTRAVENOUS

## 2016-02-03 MED ORDER — MAGNESIUM SULFATE 4 GM/100ML IV SOLN
4.0000 g | Freq: Once | INTRAVENOUS | Status: AC
  Administered 2016-02-04: 4 g via INTRAVENOUS
  Filled 2016-02-03: qty 100

## 2016-02-03 MED ORDER — METOCLOPRAMIDE HCL 5 MG/ML IJ SOLN
10.0000 mg | Freq: Four times a day (QID) | INTRAMUSCULAR | Status: DC
  Administered 2016-02-04: 10 mg via INTRAVENOUS
  Filled 2016-02-03: qty 2

## 2016-02-03 MED ORDER — MORPHINE SULFATE (PF) 4 MG/ML IV SOLN: 4.0000 mg | INTRAVENOUS | Status: DC | PRN

## 2016-02-03 MED ORDER — FAMOTIDINE IN NACL 20-0.9 MG/50ML-% IV SOLN
20.0000 mg | Freq: Two times a day (BID) | INTRAVENOUS | Status: DC
  Administered 2016-02-04 (×2): 20 mg via INTRAVENOUS
  Filled 2016-02-03 (×3): qty 50

## 2016-02-03 MED ORDER — SODIUM CHLORIDE 0.9 % IV BOLUS (SEPSIS)
1000.0000 mL | Freq: Once | INTRAVENOUS | Status: AC
  Administered 2016-02-03: 1000 mL via INTRAVENOUS

## 2016-02-03 MED ORDER — METOCLOPRAMIDE HCL 5 MG/ML IJ SOLN
10.0000 mg | Freq: Once | INTRAMUSCULAR | Status: AC
  Administered 2016-02-03: 10 mg via INTRAVENOUS
  Filled 2016-02-03: qty 2

## 2016-02-03 MED ORDER — SODIUM CHLORIDE 0.9% FLUSH: 3.0000 mL | Freq: Two times a day (BID) | INTRAVENOUS | Status: DC

## 2016-02-03 NOTE — ED Notes (Signed)
Pt states she "blacked out" in PCP office in the building-hit back of head on floor-was to be seen for abd pain, n/v x 1 month-pt is pale/skin warm dry

## 2016-02-03 NOTE — Telephone Encounter (Signed)
Dizzy, nauseu and fell doing vitals. Assessed. No head trauma. Questionable syncope. She was alert and oriented. We sent her to ED. Staff took her in wheel chair and gave report to ED MD.

## 2016-02-03 NOTE — Progress Notes (Signed)
Error

## 2016-02-03 NOTE — ED Notes (Signed)
Pt tried to give [place] urine sample, however she was not able to produce one for [place] at this time.

## 2016-02-03 NOTE — ED Notes (Signed)
Called report to unit, RN states will call back to confirm pt is appropriate for unit. [facility] has arrived to transport the pt.

## 2016-02-03 NOTE — H&P (Signed)
History and Physical    [name] BJS:[id number] DOB: 1950/09/17 DOA: 02/03/2016  PCP: Ann Held, DO   Patient coming from: [facility].  Chief Complaint: Syncopal episode.  HPI: [name] is a 65 y.o. female with medical history significant of stomach cancer on remission, S/P partial gastrectomy and chemotherapy, hypertension, type 2 diabetes, diabetic neuropathy, CKD, morbid obesity, sleep apnea not on CPAP, asthma, hyperlipidemia, GERD who was referred from [place] due to having a syncopal episode at her doctor's office at the same facility. She denies having chest pain, palpitations, dizziness, diaphoresis or any other symptoms prior to losing consciousness.  She states that she has been following with her doctor for frequent nausea with occasional vomiting. An EGD and CT scan of the abdomen done last month did not show any acute abnormalities. She states that she last had an episode of emesis yesterday. She has been taking metronidazole and ciprofloxacin apparently for presumptive diverticulitis. She currently denies abdominal pain, nausea or diarrhea. She denies melena or hematochezia. She states that she is very hungry at the moment and would like to eat something.  ED Course: The patient receive IV fluids and metoclopramide. She states she feels much better and denies dizziness, palpitations, dyspnea, diaphoresis or nausea at this time.   Review of Systems: As per HPI otherwise 10 point review of systems negative.   Past Medical History  Diagnosis Date  . stomach ca dx'd 2010    chemo/xrt comp 05/2009  . Hypertension     actually taking for DM renal protection, not DM  . Diabetes mellitus   . Asthma   . Sleep apnea   . Degenerative joint disease   . Hyperlipidemia     no longer on medication for this  . GERD (gastroesophageal reflux disease)   . Neuropathy ([place])     bilateral hands and feet  . Sickle cell trait [facility])     Past  Surgical History  Procedure Laterality Date  . Total knee arthroplasty      Left  . Total hip arthroplasty      Right  . Shoulder surgery      Left  . Appendectomy    . Cesarean section    . Hernia repair    . Colon surgery    . Esophagogastroduodenoscopy N/A 10/15/2012    Procedure: ESOPHAGOGASTRODUODENOSCOPY (EGD);  Surgeon: Lear Ng, MD;  Location: [facility];  Service: Endoscopy;  Laterality: N/A;  . Esophagogastroduodenoscopy N/A 01/15/2016    Procedure: ESOPHAGOGASTRODUODENOSCOPY (EGD);  Surgeon: Ronald Lobo, MD;  Location: [facility];  Service: Endoscopy;  Laterality: N/A;     reports that she quit smoking about 27 years ago. Her smoking use included Cigarettes. She started smoking about 42 years ago. She has a 15 pack-year smoking history. She has never used smokeless tobacco. She reports that she does not drink alcohol or use illicit drugs.  Allergies  Allergen Reactions  . Adhesive [Tape] Other (See Comments)    Burn Skin  . Codeine Other (See Comments)    paranoid  . Zocor [Simvastatin - High Dose] Other (See Comments)    Muscle spasms  . Penicillins Rash    Has patient had a PCN reaction causing immediate rash, facial/tongue/throat swelling, SOB or lightheadedness with hypotension: Yes Has patient had a PCN reaction causing severe rash involving mucus membranes or skin necrosis: No Has patient had a PCN reaction that required hospitalization does not remember.  Has patient had a PCN reaction occurring within the last 10 years: No If all of the above answers are "NO", then may proceed with Cephalosporin use.     Family History  Problem Relation Age of Onset  . Cancer Mother 61  . Alzheimer's disease Father 77  . Diabetes Brother   . Diabetes Maternal Grandmother   . Cancer Maternal Grandfather     lung  . Suicidality Neg Hx   . Depression Neg Hx   . Dementia Neg Hx   . Anxiety disorder Neg Hx      Prior to Admission medications     Medication Sig Start Date End Date Taking? Authorizing Provider  aspirin 81 MG tablet Take 81 mg by mouth at bedtime.    Historical Provider, MD  carvedilol (COREG) 12.5 MG tablet TAKE 1 TABLET BY MOUTH BY EVERY MORNING 10/16/15   Alferd Apa Lowne Chase, DO  ciprofloxacin (CIPRO) 500 MG tablet Take 1 tablet (500 mg total) by mouth 2 (two) times daily. 01/26/16   Percell Miller Saguier, PA-C  DULoxetine (CYMBALTA) 60 MG capsule Take 1 capsule (60 mg total) by mouth 2 (two) times daily. 06/09/15   Yvonne R Lowne Chase, DO  EMBEDA 30-1.2 MG CPCR TK ONE C PO ONCE D 12/24/15   Historical Provider, MD  glimepiride (AMARYL) 4 MG tablet TAKE 1 TABLET BY MOUTH TWICE A DAY 09/17/15   Yvonne R Lowne Chase, DO  hydrochlorothiazide (HYDRODIURIL) 25 MG tablet TAKE 1 TABLET (25 MG TOTAL) BY MOUTH DAILY. 10/16/15   Alferd Apa Lowne Chase, DO  hyoscyamine (LEVSIN SL) 0.125 MG SL tablet Place 1 tablet (0.125 mg total) under the tongue every 6 (six) hours as needed (abdominal pain). 01/20/16   Nishant Dhungel, MD  lidocaine-prilocaine (EMLA) cream Apply 1 application topically as needed. 06/29/15   Volanda Napoleon, MD  LORazepam (ATIVAN) 0.5 MG tablet Take 1 tablet (0.5 mg total) by mouth at bedtime as needed for anxiety. 08/21/15   Alferd Apa Lowne Chase, DO  metaxalone (SKELAXIN) 800 MG tablet Take 1 tablet (800 mg total) by mouth 4 (four) times daily. Patient taking differently: Take 800 mg by mouth 4 (four) times daily as needed for muscle spasms.  04/06/15   Rosalita Chessman Chase, DO  metFORMIN (GLUCOPHAGE) 850 MG tablet Take 1 tablet (850 mg total) by mouth 2 (two) times daily. Repeat labs are due now 12/30/15   Philemon Kingdom, MD  metoCLOPramide (REGLAN) 5 MG tablet Take 1 tablet (5 mg total) by mouth every 8 (eight) hours as needed for nausea or vomiting. 01/25/16   Percell Miller Saguier, PA-C  metroNIDAZOLE (FLAGYL) 500 MG tablet Take 1 tablet (500 mg total) by mouth 3 (three) times daily. 01/26/16   Percell Miller Saguier, PA-C  morphine (MSIR) 15 MG  tablet TK 1 T PO TID PRN 12/18/15   Historical Provider, MD  Multiple Vitamin (MULTIVITAMIN WITH MINERALS) TABS tablet Take 1 tablet by mouth daily.    Historical Provider, MD  ondansetron (ZOFRAN ODT) 8 MG disintegrating tablet Take 1 tablet (8 mg total) by mouth every 8 (eight) hours as needed for nausea or vomiting. 01/25/16   Percell Miller Saguier, PA-C  pantoprazole (PROTONIX) 40 MG tablet Take 1 tablet (40 mg total) by mouth daily. 01/13/16   Virgel Manifold, MD    Physical Exam: Filed Vitals:   02/03/16 2130 02/03/16 2200 02/03/16 2208 02/03/16 2302  BP: 106/66 106/65 103/72 135/96  Pulse: 69 70 72 61  Temp:    97.7 F (  36.5 C)  TempSrc:    Oral  Resp: 17 16 19 19   Height:      Weight:      SpO2: 95% 94% 96% 98%      Constitutional: NAD, calm, comfortable Filed Vitals:   02/03/16 2130 02/03/16 2200 02/03/16 2208 02/03/16 2302  BP: 106/66 106/65 103/72 135/96  Pulse: 69 70 72 61  Temp:    97.7 F (36.5 C)  TempSrc:    Oral  Resp: 17 16 19 19   Height:      Weight:      SpO2: 95% 94% 96% 98%   Eyes: PERRL, lids and conjunctivae normal ENMT: Mucous membranes are moist. Posterior pharynx clear of any exudate or lesions. Poor state of repair of dentition.  Neck: normal, supple, no masses, no thyromegaly Respiratory: clear to auscultation bilaterally, no wheezing, no crackles. Normal respiratory effort.  Cardiovascular: Regular rate and rhythm, no murmurs / rubs / gallops. No extremity edema. 2+ pedal pulses. No carotid bruits.  Abdomen: Bowel sounds positive. Soft, no tenderness,no masses palpated.No hepatosplenomegaly.  Musculoskeletal: no clubbing / cyanosis. No joint deformity upper and lower extremities. Good ROM, no contractures. Normal muscle tone.  Skin: no rashes, lesions, ulcers. No induration Neurologic: CN 2-12 grossly intact. Sensation intact, DTR normal. Strength 5/5 in all 4.  Psychiatric: Normal judgment and insight. Alert and oriented x 4. Normal mood.     Labs on  Admission: I have personally reviewed following labs and imaging studies  CBC:  Recent Labs Lab 02/03/16 1540  WBC 7.6  HGB 12.4  HCT 35.6*  MCV 87.5  PLT 782   Basic Metabolic Panel:  Recent Labs Lab 02/03/16 1540  NA 138  K 4.3  CL 102  CO2 28  GLUCOSE 241*  BUN 11  CREATININE 1.96*  CALCIUM 8.5*  MG 1.2*  PHOS 3.6   GFR: Estimated Creatinine Clearance: 36.1 mL/min (by C-G formula based on Cr of 1.96). Liver Function Tests:  Recent Labs Lab 02/03/16 1540  AST 63*  ALT 43  ALKPHOS 77  BILITOT 0.6  PROT 6.1*  ALBUMIN 2.8*    Recent Labs Lab 02/03/16 1540  LIPASE <10*   Cardiac Enzymes:  Recent Labs Lab 02/03/16 1540  TROPONINI <0.03   Urine analysis:    Component Value Date/Time   COLORURINE YELLOW 01/14/2016 2023   APPEARANCEUR CLOUDY* 01/14/2016 2023   LABSPEC 1.025 01/14/2016 2023   LABSPEC 1.015 04/06/2015 0939   PHURINE 6.0 01/14/2016 2023   GLUCOSEU NEGATIVE 01/14/2016 2023   HGBUR NEGATIVE 01/14/2016 2023   BILIRUBINUR MODERATE* 01/14/2016 2023   BILIRUBINUR 2+ 01/04/2016 1619   KETONESUR 40* 01/14/2016 2023   PROTEINUR NEGATIVE 01/14/2016 2023   PROTEINUR trace 01/04/2016 1619   UROBILINOGEN 1.0 01/04/2016 1619   UROBILINOGEN 0.2 04/06/2015 0939   UROBILINOGEN 1.0 12/09/2013 1655   NITRITE NEGATIVE 01/14/2016 2023   NITRITE neg 01/04/2016 1619   LEUKOCYTESUR SMALL* 01/14/2016 2023    Radiological Exams on Admission: Dg Chest 2 View  02/03/2016  CLINICAL DATA:  Syncope today.  Not feeling well EXAM: CHEST  2 VIEW COMPARISON:  01/12/2016 FINDINGS: There is a right-sided Port-A-Cath with the tip projecting over the SVC. There is no focal parenchymal opacity. There is no pleural effusion or pneumothorax. The heart and mediastinal contours are unremarkable. The osseous structures are unremarkable. IMPRESSION: No active cardiopulmonary disease. Electronically Signed   By: Kathreen Devoid   On: 02/03/2016 19:28   Ct Head Wo  Contrast  02/03/2016  CLINICAL DATA:  65 year old female with syncope, fall and head and neck injury. Headache and cervical spine pain. EXAM: CT HEAD WITHOUT CONTRAST CT CERVICAL SPINE WITHOUT CONTRAST TECHNIQUE: Multidetector CT imaging of the head and cervical spine was performed following the standard protocol without intravenous contrast. Multiplanar CT image reconstructions of the cervical spine were also generated. COMPARISON:  02/17/2012 head CT and prior exams FINDINGS: CT HEAD FINDINGS No intracranial abnormalities are identified, including mass lesion or mass effect, hydrocephalus, extra-axial fluid collection, midline shift, hemorrhage, or acute infarction. The visualized bony calvarium is unremarkable. CT CERVICAL SPINE FINDINGS There is no evidence of fracture, subluxation or prevertebral soft tissue swelling. Mild multilevel degenerative disc disease and spondylosis noted. No bony lesions are present. The soft tissues are unremarkable. IMPRESSION: Unremarkable noncontrast head CT. No static evidence of acute injury to the cervical spine. Electronically Signed   By: Margarette Canada M.D.   On: 02/03/2016 17:29   Ct Cervical Spine Wo Contrast  02/03/2016  CLINICAL DATA:  65 year old female with syncope, fall and head and neck injury. Headache and cervical spine pain. EXAM: CT HEAD WITHOUT CONTRAST CT CERVICAL SPINE WITHOUT CONTRAST TECHNIQUE: Multidetector CT imaging of the head and cervical spine was performed following the standard protocol without intravenous contrast. Multiplanar CT image reconstructions of the cervical spine were also generated. COMPARISON:  02/17/2012 head CT and prior exams FINDINGS: CT HEAD FINDINGS No intracranial abnormalities are identified, including mass lesion or mass effect, hydrocephalus, extra-axial fluid collection, midline shift, hemorrhage, or acute infarction. The visualized bony calvarium is unremarkable. CT CERVICAL SPINE FINDINGS There is no evidence of fracture,  subluxation or prevertebral soft tissue swelling. Mild multilevel degenerative disc disease and spondylosis noted. No bony lesions are present. The soft tissues are unremarkable. IMPRESSION: Unremarkable noncontrast head CT. No static evidence of acute injury to the cervical spine. Electronically Signed   By: Margarette Canada M.D.   On: 02/03/2016 17:29  Echocardiogram 10/08/2014  ------------------------------------------------------------------- LV EF: 55% -  60%  ------------------------------------------------------------------- Indications:   TIA 435.9.  ------------------------------------------------------------------- History:  PMH: Former smoker. Risk factors: Hypertension. Diabetes mellitus. Dyslipidemia.  ------------------------------------------------------------------- Study Conclusions  - Left ventricle: The cavity size was normal. There was mild concentric hypertrophy. Systolic function was normal. The estimated ejection fraction was in the range of 55% to 60%. Wall motion was normal; there were no regional wall motion abnormalities. Doppler parameters are consistent with abnormal left ventricular relaxation (grade 1 diastolic dysfunction). Doppler parameters are consistent with elevated mean left atrial filling pressure. - Right ventricle: Systolic function was mildly reduced.   EKG: Independently reviewed. Vent. rate 84 BPM PR interval * ms QRS duration 87 ms QT/QTc 355/420 ms P-R-T axes 39 -5 45 Sinus rhythm Baseline wander in lead(s) III  Assessment/Plan Principal Problem:   Syncope Telemetry/observation. Likely due to volume depletion. Continue gentle IV hydration. Trend troponin levels. Check echocardiogram in the morning. Check carotid Doppler in a.m.  Active Problems:   Hypotension Resolved after fluid boluses. Hold hydrochlorothiazide for now. Switch carvedilol from 12.5 mg by mouth daily to 6.25 mg by mouth twice a  day. Monitor blood pressure.    Essential hypertension As above.    Hypomagnesemia Replaced with magnesium sulfate.    AKI (acute kidney injury) ([place]) Likely secondary to volume depletion. Continue fluid challenge and recheck renal function in the morning. Hold hydrochlorothiazide and metformin.    Hyperlipidemia Sensitive to statins. Continue lifestyle modifications.      Asthma Albuterol inhaler as needed.  GERD (gastroesophageal reflux disease) Pantoprazole 40 mg by mouth daily.   MDD (major depressive disorder), recurrent episode, moderate (HCC) Continue Duloxetine 60 mg by mouth twice a day   DVT prophylaxis: SCDs. Code Status: Full. Family Communication:  Disposition Plan: Admit overnight for syncope workup. Consults called:  Admission status: Observation/telemetry   Reubin Milan MD [facility] Pager [phone].  If 7PM-7AM, please contact night-coverage [email or web address] Password [facility]  02/03/2016, 11:19 PM

## 2016-02-03 NOTE — ED Notes (Signed)
Pt placed on auto vitals Q30. Patient placed on cardiac monitor.

## 2016-02-03 NOTE — ED Provider Notes (Signed)
CSN: [id number]     Arrival date & time 02/03/16  1518 History   First MD Initiated Contact with Patient 02/03/16 1624     Chief Complaint  Patient presents with  . Loss of Consciousness     (Consider location/radiation/quality/duration/timing/severity/associated sxs/prior Treatment) HPI Comments: 65 year old female with history of stomach cancer, hypertension, diabetes, OSA who presents with syncope. The patient was hospitalized a few weeks ago for her abdominal pain and vomiting symptoms. Her workup was negative and she was discharged home after several days. She followed up with her PCP today for routine reevaluation. While she was in the office, she got up and walked to the scale to be weighed and felt lightheaded and then had an episode of "blacking out." She does report striking the back of her head on the floor. She reports one other episode of syncope that occurred at home which occurred wall trying to walk to the bathroom. She denies any chest pain or shortness of breath surrounding these events. She reports that she has had some ongoing vomiting, last episode was yesterday. She took medication at home earlier today for nausea. No history of blood clots or recent travel.   Patient is a 65 y.o. female presenting with syncope. The history is provided by the patient.  Loss of Consciousness   Past Medical History  Diagnosis Date  . stomach ca dx'd 2010    chemo/xrt comp 05/2009  . Hypertension     actually taking for DM renal protection, not DM  . Diabetes mellitus   . Asthma   . Sleep apnea   . Degenerative joint disease   . Hyperlipidemia     no longer on medication for this  . GERD (gastroesophageal reflux disease)   . Neuropathy ([place])     bilateral hands and feet  . Sickle cell trait [facility])    Past Surgical History  Procedure Laterality Date  . Total knee arthroplasty      Left  . Total hip arthroplasty      Right  . Shoulder surgery      Left  . Appendectomy    .  Cesarean section    . Hernia repair    . Colon surgery    . Esophagogastroduodenoscopy N/A 10/15/2012    Procedure: ESOPHAGOGASTRODUODENOSCOPY (EGD);  Surgeon: Lear Ng, MD;  Location: [facility];  Service: Endoscopy;  Laterality: N/A;  . Esophagogastroduodenoscopy N/A 01/15/2016    Procedure: ESOPHAGOGASTRODUODENOSCOPY (EGD);  Surgeon: Ronald Lobo, MD;  Location: [facility];  Service: Endoscopy;  Laterality: N/A;   Family History  Problem Relation Age of Onset  . Cancer Mother 48  . Alzheimer's disease Father 41  . Diabetes Brother   . Diabetes Maternal Grandmother   . Cancer Maternal Grandfather     lung  . Suicidality Neg Hx   . Depression Neg Hx   . Dementia Neg Hx   . Anxiety disorder Neg Hx    Social History  Substance Use Topics  . Smoking status: Former Smoker -- 1.00 packs/day for 15 years    Types: Cigarettes    Start date: 09/27/1973    Quit date: 10/15/1988  . Smokeless tobacco: Never Used     Comment: quit smoking 14 years ago  . Alcohol Use: No   OB History    Gravida Para Term Preterm AB TAB SAB Ectopic Multiple Living   4 4        4      Review of Systems  Cardiovascular: Positive  for syncope.   10 Systems reviewed and are negative for acute change except as noted in the HPI.    Allergies  Adhesive; Codeine; Zocor; and Penicillins  Home Medications   Prior to Admission medications   Medication Sig Start Date End Date Taking? Authorizing Provider  aspirin 81 MG tablet Take 81 mg by mouth at bedtime.    Historical Provider, MD  carvedilol (COREG) 12.5 MG tablet TAKE 1 TABLET BY MOUTH BY EVERY MORNING 10/16/15   Alferd Apa Lowne Chase, DO  ciprofloxacin (CIPRO) 500 MG tablet Take 1 tablet (500 mg total) by mouth 2 (two) times daily. 01/26/16   Percell Miller Saguier, PA-C  DULoxetine (CYMBALTA) 60 MG capsule Take 1 capsule (60 mg total) by mouth 2 (two) times daily. 06/09/15   Yvonne R Lowne Chase, DO  EMBEDA 30-1.2 MG CPCR TK ONE C PO ONCE D  12/24/15   Historical Provider, MD  glimepiride (AMARYL) 4 MG tablet TAKE 1 TABLET BY MOUTH TWICE A DAY 09/17/15   Yvonne R Lowne Chase, DO  hydrochlorothiazide (HYDRODIURIL) 25 MG tablet TAKE 1 TABLET (25 MG TOTAL) BY MOUTH DAILY. 10/16/15   Alferd Apa Lowne Chase, DO  hyoscyamine (LEVSIN SL) 0.125 MG SL tablet Place 1 tablet (0.125 mg total) under the tongue every 6 (six) hours as needed (abdominal pain). 01/20/16   Nishant Dhungel, MD  lidocaine-prilocaine (EMLA) cream Apply 1 application topically as needed. 06/29/15   Volanda Napoleon, MD  LORazepam (ATIVAN) 0.5 MG tablet Take 1 tablet (0.5 mg total) by mouth at bedtime as needed for anxiety. 08/21/15   Alferd Apa Lowne Chase, DO  metaxalone (SKELAXIN) 800 MG tablet Take 1 tablet (800 mg total) by mouth 4 (four) times daily. Patient taking differently: Take 800 mg by mouth 4 (four) times daily as needed for muscle spasms.  04/06/15   Rosalita Chessman Chase, DO  metFORMIN (GLUCOPHAGE) 850 MG tablet Take 1 tablet (850 mg total) by mouth 2 (two) times daily. Repeat labs are due now 12/30/15   Philemon Kingdom, MD  metoCLOPramide (REGLAN) 5 MG tablet Take 1 tablet (5 mg total) by mouth every 8 (eight) hours as needed for nausea or vomiting. 01/25/16   Percell Miller Saguier, PA-C  metroNIDAZOLE (FLAGYL) 500 MG tablet Take 1 tablet (500 mg total) by mouth 3 (three) times daily. 01/26/16   Percell Miller Saguier, PA-C  morphine (MSIR) 15 MG tablet TK 1 T PO TID PRN 12/18/15   Historical Provider, MD  Multiple Vitamin (MULTIVITAMIN WITH MINERALS) TABS tablet Take 1 tablet by mouth daily.    Historical Provider, MD  ondansetron (ZOFRAN ODT) 8 MG disintegrating tablet Take 1 tablet (8 mg total) by mouth every 8 (eight) hours as needed for nausea or vomiting. 01/25/16   Percell Miller Saguier, PA-C  pantoprazole (PROTONIX) 40 MG tablet Take 1 tablet (40 mg total) by mouth daily. 01/13/16   Virgel Manifold, MD   BP 91/60 mmHg  Pulse 81  Temp(Src) 98.1 F (36.7 C) (Oral)  Resp 10  Ht 5' 7"  (1.702 m)   Wt 225 lb (102.059 kg)  BMI 35.23 kg/m2  SpO2 93% Physical Exam  Constitutional: She is oriented to person, place, and time. She appears well-developed and well-nourished. No distress.  Awake, alert  HENT:  Head: Normocephalic and atraumatic.  Eyes: Conjunctivae and EOM are normal. Pupils are equal, round, and reactive to light.  Neck: Neck supple.  Cardiovascular: Normal rate, regular rhythm and normal heart sounds.   No murmur heard. Pulmonary/Chest: Effort normal  and breath sounds normal. No respiratory distress.  Abdominal: Soft. Bowel sounds are normal. She exhibits no distension.  Musculoskeletal: She exhibits no edema.  Neurological: She is alert and oriented to person, place, and time. No cranial nerve deficit. She exhibits normal muscle tone.  Fluent speech 5/5 strength and normal sensation x all 4 extremities  Skin: Skin is warm and dry.  Psychiatric: She has a normal mood and affect. Judgment and thought content normal.  Nursing note and vitals reviewed.   ED Course  .Critical Care Performed by: Sharlett Iles Authorized by: Sharlett Iles Total critical care time: 35 minutes Critical care time was exclusive of separately billable procedures and treating other patients. Critical care was necessary to treat or prevent imminent or life-threatening deterioration of the following conditions: dehydration (hypotension). Critical care was time spent personally by me on the following activities: development of treatment plan with patient or surrogate, evaluation of patient's response to treatment, examination of patient, obtaining history from patient or surrogate, ordering and performing treatments and interventions, ordering and review of laboratory studies, ordering and review of radiographic studies, re-evaluation of patient's condition and review of old charts.   (including critical care time) Labs Review Labs Reviewed  BASIC METABOLIC PANEL - Abnormal;  Notable for the following:    Glucose, Bld 241 (*)    Creatinine, Ser 1.96 (*)    Calcium 8.5 (*)    GFR calc non Af Amer 26 (*)    GFR calc Af Amer 30 (*)    All other components within normal limits  CBC - Abnormal; Notable for the following:    HCT 35.6 (*)    RDW 15.9 (*)    All other components within normal limits  HEPATIC FUNCTION PANEL - Abnormal; Notable for the following:    Total Protein 6.1 (*)    Albumin 2.8 (*)    AST 63 (*)    All other components within normal limits  LIPASE, BLOOD - Abnormal; Notable for the following:    Lipase <10 (*)    All other components within normal limits  D-DIMER, QUANTITATIVE (NOT AT [facility]) - Abnormal; Notable for the following:    D-Dimer, Quant 0.85 (*)    All other components within normal limits  MAGNESIUM - Abnormal; Notable for the following:    Magnesium 1.2 (*)    All other components within normal limits  TROPONIN I  PHOSPHORUS  URINALYSIS, ROUTINE W REFLEX MICROSCOPIC (NOT AT [facility])  TROPONIN I  TROPONIN I  CBC WITH DIFFERENTIAL/PLATELET  COMPREHENSIVE METABOLIC PANEL    Imaging Review Dg Chest 2 View  02/03/2016  CLINICAL DATA:  Syncope today.  Not feeling well EXAM: CHEST  2 VIEW COMPARISON:  01/12/2016 FINDINGS: There is a right-sided Port-A-Cath with the tip projecting over the SVC. There is no focal parenchymal opacity. There is no pleural effusion or pneumothorax. The heart and mediastinal contours are unremarkable. The osseous structures are unremarkable. IMPRESSION: No active cardiopulmonary disease. Electronically Signed   By: Kathreen Devoid   On: 02/03/2016 19:28   Ct Head Wo Contrast  02/03/2016  CLINICAL DATA:  65 year old female with syncope, fall and head and neck injury. Headache and cervical spine pain. EXAM: CT HEAD WITHOUT CONTRAST CT CERVICAL SPINE WITHOUT CONTRAST TECHNIQUE: Multidetector CT imaging of the head and cervical spine was performed following the standard protocol without intravenous contrast.  Multiplanar CT image reconstructions of the cervical spine were also generated. COMPARISON:  02/17/2012 head CT and prior exams  FINDINGS: CT HEAD FINDINGS No intracranial abnormalities are identified, including mass lesion or mass effect, hydrocephalus, extra-axial fluid collection, midline shift, hemorrhage, or acute infarction. The visualized bony calvarium is unremarkable. CT CERVICAL SPINE FINDINGS There is no evidence of fracture, subluxation or prevertebral soft tissue swelling. Mild multilevel degenerative disc disease and spondylosis noted. No bony lesions are present. The soft tissues are unremarkable. IMPRESSION: Unremarkable noncontrast head CT. No static evidence of acute injury to the cervical spine. Electronically Signed   By: Margarette Canada M.D.   On: 02/03/2016 17:29   Ct Cervical Spine Wo Contrast  02/03/2016  CLINICAL DATA:  65 year old female with syncope, fall and head and neck injury. Headache and cervical spine pain. EXAM: CT HEAD WITHOUT CONTRAST CT CERVICAL SPINE WITHOUT CONTRAST TECHNIQUE: Multidetector CT imaging of the head and cervical spine was performed following the standard protocol without intravenous contrast. Multiplanar CT image reconstructions of the cervical spine were also generated. COMPARISON:  02/17/2012 head CT and prior exams FINDINGS: CT HEAD FINDINGS No intracranial abnormalities are identified, including mass lesion or mass effect, hydrocephalus, extra-axial fluid collection, midline shift, hemorrhage, or acute infarction. The visualized bony calvarium is unremarkable. CT CERVICAL SPINE FINDINGS There is no evidence of fracture, subluxation or prevertebral soft tissue swelling. Mild multilevel degenerative disc disease and spondylosis noted. No bony lesions are present. The soft tissues are unremarkable. IMPRESSION: Unremarkable noncontrast head CT. No static evidence of acute injury to the cervical spine. Electronically Signed   By: Margarette Canada M.D.   On: 02/03/2016  17:29   I have personally reviewed and evaluated these lab results as part of my medical decision-making.   EKG Interpretation   Date/Time:  Wednesday February 03 2016 15:32:41 EDT Ventricular Rate:  84 PR Interval:    QRS Duration: 87 QT Interval:  355 QTC Calculation: 420 R Axis:   -5 Text Interpretation:  Sinus rhythm Baseline wander in lead(s) III Since  previous tracing tachycardia has improved Confirmed by LITTLE MD, RACHEL  [phone]) on 02/03/2016 4:26:54 PM     Medications  sodium chloride 0.9 % bolus 1,000 mL (1,000 mLs Intravenous New Bag/Given 02/03/16 1629)    MDM   Final diagnoses:  Syncope and collapse  Hypotension, unspecified hypotension type  AKI (acute kidney injury) ([place])   Pt w/ 1 mo hx of abdominal pain, nausea and vomiting of unclear etiology after inpatient work up presents after a syncopal episode that occurred At her doctor's office when she got up and walked forward. On arrival here, she was hypotensive at 85/55, heart rate 89; VS quickly improved to 91/60. She was awake and alert, normal neurologic exam. EKG showed sinus rhythm, no significant change from previous. Gave the patient an IV fluid bolus and obtained above lab work.  Chest x-ray negative and CT of head and C-spine negative for acute injury.  Labs show acute on chronic kidney injury with creatinine of 1.96. Hemoglobin 12.4. Because of the patient's recent hospitalization and syncopal episode, differential includes PE. Obtained d-dimer which was elevated. The patient will require a VQ scan because of her kidney function. The patient remained severely orthostatic after 1 L of fluids and was given a second liter. Repeat blood pressures remain low in the 35T systolic. Her MAPs are 65 or greater but I reviewed her chart which shows multiple previous visits with normal blood pressure. I am concerned that this hypotension is new. The patient has mentating appropriately and has never been tachycardic here.  Because of the patient's  AK I, ongoing hypotension, and syncopal episode, I discussed admission with [facility] hospitalist, Dr. Darnell Level. Pt transferred to [facility] for admission.   Sharlett Iles, MD 02/04/16 [phone]

## 2016-02-03 NOTE — Progress Notes (Signed)
Patient ID: [name], female   DOB: 07-24-1951, 65 y.o.   MRN: [id number] Accepted to telemetry bed for observation. (Syncope and volume depletion) Please call the floor manager at extension [phone] for admitting physician assignment when the patient arrives to the floor at [facility].  Chief Complaint  Patient presents with  . Loss of Consciousness     (Consider location/radiation/quality/duration/timing/severity/associated sxs/prior Treatment) HPI Comments: 65 year old female with history of stomach cancer, hypertension, diabetes, OSA who presents with syncope. The patient was hospitalized a few weeks ago for her abdominal pain and vomiting symptoms. Her workup was negative and she was discharged home after several days. She followed up with her PCP today for routine reevaluation. While she was in the office, she got up and walked to the scale to be weighed and felt lightheaded and then had an episode of "blacking out." She does report striking the back of her head on the floor. She reports one other episode of syncope that occurred at home which occurred wall trying to walk to the bathroom. She denies any chest pain or shortness of breath surrounding these events. She reports that she has had some ongoing vomiting, last episode was yesterday. She took medication at home earlier today for nausea. No history of blood clots or recent travel.   Patient is a 65 y.o. female presenting with syncope. The history is provided by the patient.      EKG 02/03/2016  Vent. rate 84 BPM PR interval * ms QRS duration 87 ms QT/QTc 355/420 ms P-R-T axes 39 -5 45 Sinus rhythm Baseline wander in lead(s) III  D-dimer, quantitative [[id number]] (Abnormal) Collected: 02/03/16 1540    Updated: 02/03/16 1847    Specimen Type: Blood    Specimen Source: Vein     D-Dimer, Quant 0.85 (H) ug/mL-FEU   Hepatic function panel [[id number]] (Abnormal) Collected: 02/03/16 1540   Updated: 02/03/16  1734    Specimen Type: Blood    Specimen Source: Vein     Total Protein 6.1 (L) g/dL    Albumin 2.8 (L) g/dL    AST 63 (H) U/L    ALT 43 U/L    Alkaline Phosphatase 77 U/L    Total Bilirubin 0.6 mg/dL    Bilirubin, Direct 0.2 mg/dL    Indirect Bilirubin 0.4 mg/dL   Lipase, blood [[id number]] (Abnormal) Collected: 02/03/16 1540   Updated: 02/03/16 1734    Specimen Type: Blood    Specimen Source: Vein     Lipase <10 (L) U/L   Troponin I [[id number]] Collected: 02/03/16 1540   Updated: 02/03/16 1722    Specimen Type: Blood    Specimen Source: Vein     Troponin I <0.03 ng/mL   Basic metabolic panel [[id number]] (Abnormal) Collected: 02/03/16 1540   Updated: 02/03/16 1606    Specimen Type: Blood    Specimen Source: Vein     Sodium 138 mmol/L    Potassium 4.3 mmol/L    Chloride 102 mmol/L    CO2 28 mmol/L    Glucose, Bld 241 (H) mg/dL    BUN 11 mg/dL    Creatinine, Ser 1.96 (H) mg/dL    Calcium 8.5 (L) mg/dL    GFR calc non Af Amer 26 (L) mL/min    GFR calc Af Amer 30 (L) mL/min    Anion gap 8   CBC [[id number]] (Abnormal) Collected: 02/03/16 1540   Updated: 02/03/16 1549    Specimen Type: Blood    Specimen Source: Vein  WBC 7.6 K/uL    RBC 4.07 MIL/uL    Hemoglobin 12.4 g/dL    HCT 35.6 (L) %    MCV 87.5 fL    MCH 30.5 pg    MCHC 34.8 g/dL    RDW 15.9 (H) %    Platelets 332 K/uL     Chest radiographs, CT of the head and C-spine without contrast were unremarkable.  Tennis Must M.D. [phone].

## 2016-02-03 NOTE — Progress Notes (Signed)
This encounter was created in error - please disregard.

## 2016-02-04 ENCOUNTER — Encounter: Payer: Self-pay | Admitting: Family Medicine

## 2016-02-04 ENCOUNTER — Observation Stay (HOSPITAL_COMMUNITY): Payer: Medicare HMO

## 2016-02-04 ENCOUNTER — Observation Stay (HOSPITAL_BASED_OUTPATIENT_CLINIC_OR_DEPARTMENT_OTHER): Payer: Medicare HMO

## 2016-02-04 DIAGNOSIS — R55 Syncope and collapse: Secondary | ICD-10-CM

## 2016-02-04 DIAGNOSIS — N179 Acute kidney failure, unspecified: Secondary | ICD-10-CM

## 2016-02-04 DIAGNOSIS — I10 Essential (primary) hypertension: Secondary | ICD-10-CM | POA: Diagnosis not present

## 2016-02-04 DIAGNOSIS — I129 Hypertensive chronic kidney disease with stage 1 through stage 4 chronic kidney disease, or unspecified chronic kidney disease: Secondary | ICD-10-CM | POA: Diagnosis not present

## 2016-02-04 DIAGNOSIS — I959 Hypotension, unspecified: Secondary | ICD-10-CM | POA: Diagnosis not present

## 2016-02-04 DIAGNOSIS — E1122 Type 2 diabetes mellitus with diabetic chronic kidney disease: Secondary | ICD-10-CM | POA: Diagnosis not present

## 2016-02-04 LAB — CBC WITH DIFFERENTIAL/PLATELET
Basophils Absolute: 0 10*3/uL (ref 0.0–0.1)
Basophils Relative: 0 %
EOS ABS: 0.6 10*3/uL (ref 0.0–0.7)
Eosinophils Relative: 9 %
HEMATOCRIT: 32.9 % — AB (ref 36.0–46.0)
HEMOGLOBIN: 11.3 g/dL — AB (ref 12.0–15.0)
LYMPHS ABS: 3.5 10*3/uL (ref 0.7–4.0)
Lymphocytes Relative: 49 %
MCH: 30.3 pg (ref 26.0–34.0)
MCHC: 34.3 g/dL (ref 30.0–36.0)
MCV: 88.2 fL (ref 78.0–100.0)
MONO ABS: 0.7 10*3/uL (ref 0.1–1.0)
MONOS PCT: 10 %
NEUTROS PCT: 32 %
Neutro Abs: 2.3 10*3/uL (ref 1.7–7.7)
Platelets: 323 10*3/uL (ref 150–400)
RBC: 3.73 MIL/uL — ABNORMAL LOW (ref 3.87–5.11)
RDW: 16.1 % — AB (ref 11.5–15.5)
WBC: 7.1 10*3/uL (ref 4.0–10.5)

## 2016-02-04 LAB — COMPREHENSIVE METABOLIC PANEL
ALK PHOS: 65 U/L (ref 38–126)
ALT: 37 U/L (ref 14–54)
ANION GAP: 6 (ref 5–15)
AST: 56 U/L — ABNORMAL HIGH (ref 15–41)
Albumin: 2.5 g/dL — ABNORMAL LOW (ref 3.5–5.0)
BILIRUBIN TOTAL: 0.7 mg/dL (ref 0.3–1.2)
BUN: 10 mg/dL (ref 6–20)
CALCIUM: 7.8 mg/dL — AB (ref 8.9–10.3)
CO2: 25 mmol/L (ref 22–32)
Chloride: 110 mmol/L (ref 101–111)
Creatinine, Ser: 1.63 mg/dL — ABNORMAL HIGH (ref 0.44–1.00)
GFR calc non Af Amer: 33 mL/min — ABNORMAL LOW (ref >60)
GFR, EST AFRICAN AMERICAN: 38 mL/min — AB (ref >60)
Glucose, Bld: 131 mg/dL — ABNORMAL HIGH (ref 65–99)
Potassium: 3.8 mmol/L (ref 3.5–5.1)
SODIUM: 141 mmol/L (ref 135–145)
TOTAL PROTEIN: 5.4 g/dL — AB (ref 6.5–8.1)

## 2016-02-04 LAB — GLUCOSE, CAPILLARY
GLUCOSE-CAPILLARY: 296 mg/dL — AB (ref 65–99)
Glucose-Capillary: 122 mg/dL — ABNORMAL HIGH (ref 65–99)
Glucose-Capillary: 198 mg/dL — ABNORMAL HIGH (ref 65–99)

## 2016-02-04 LAB — URINALYSIS, ROUTINE W REFLEX MICROSCOPIC
BILIRUBIN URINE: NEGATIVE
Glucose, UA: NEGATIVE mg/dL
Hgb urine dipstick: NEGATIVE
Ketones, ur: NEGATIVE mg/dL
Leukocytes, UA: NEGATIVE
NITRITE: NEGATIVE
PROTEIN: NEGATIVE mg/dL
SPECIFIC GRAVITY, URINE: 1.009 (ref 1.005–1.030)
pH: 5 (ref 5.0–8.0)

## 2016-02-04 LAB — TROPONIN I: Troponin I: <0.03 ng/mL (ref <0.03)

## 2016-02-04 LAB — ECHOCARDIOGRAM COMPLETE
HEIGHTINCHES: 67 in
Weight: 3735.47 oz

## 2016-02-04 MED ORDER — FAMOTIDINE 20 MG PO TABS: 20.0000 mg | ORAL_TABLET | Freq: Every day | ORAL | Status: DC

## 2016-02-04 MED ORDER — ASPIRIN EC 81 MG PO TBEC
81.0000 mg | DELAYED_RELEASE_TABLET | Freq: Every day | ORAL | Status: DC
Start: 2016-02-04 — End: 2016-02-04

## 2016-02-04 MED ORDER — CIPROFLOXACIN HCL 500 MG PO TABS
500.0000 mg | ORAL_TABLET | Freq: Two times a day (BID) | ORAL | Status: DC
  Administered 2016-02-04: 500 mg via ORAL
  Filled 2016-02-04: qty 1

## 2016-02-04 MED ORDER — ONDANSETRON HCL 4 MG/2ML IJ SOLN: 4.0000 mg | Freq: Four times a day (QID) | INTRAMUSCULAR | Status: DC | PRN

## 2016-02-04 MED ORDER — GLIMEPIRIDE 4 MG PO TABS
4.0000 mg | ORAL_TABLET | Freq: Two times a day (BID) | ORAL | Status: DC
  Administered 2016-02-04 (×2): 4 mg via ORAL
  Filled 2016-02-04 (×2): qty 1

## 2016-02-04 MED ORDER — CARVEDILOL 12.5 MG PO TABS: 6.2500 mg | ORAL_TABLET | Freq: Two times a day (BID) | ORAL | Status: DC

## 2016-02-04 MED ORDER — PANTOPRAZOLE SODIUM 40 MG PO TBEC
40.0000 mg | DELAYED_RELEASE_TABLET | Freq: Every day | ORAL | Status: DC
  Filled 2016-02-04: qty 1

## 2016-02-04 MED ORDER — METAXALONE 800 MG PO TABS
800.0000 mg | ORAL_TABLET | Freq: Four times a day (QID) | ORAL | Status: DC | PRN
  Filled 2016-02-04: qty 1

## 2016-02-04 MED ORDER — METFORMIN HCL 850 MG PO TABS
850.0000 mg | ORAL_TABLET | Freq: Two times a day (BID) | ORAL | Status: DC
  Filled 2016-02-04: qty 1

## 2016-02-04 MED ORDER — CARVEDILOL 6.25 MG PO TABS
6.2500 mg | ORAL_TABLET | Freq: Two times a day (BID) | ORAL | Status: DC
  Administered 2016-02-04 (×2): 6.25 mg via ORAL
  Filled 2016-02-04 (×2): qty 1

## 2016-02-04 MED ORDER — METFORMIN HCL 850 MG PO TABS
850.0000 mg | ORAL_TABLET | Freq: Two times a day (BID) | ORAL | Status: DC
Start: 2016-02-04 — End: 2016-02-04
  Administered 2016-02-04: 850 mg via ORAL
  Filled 2016-02-04: qty 1

## 2016-02-04 MED ORDER — DULOXETINE HCL 30 MG PO CPEP
60.0000 mg | ORAL_CAPSULE | Freq: Two times a day (BID) | ORAL | Status: DC
  Administered 2016-02-04: 60 mg via ORAL
  Filled 2016-02-04: qty 2

## 2016-02-04 MED ORDER — LORAZEPAM 1 MG PO TABS: 1.0000 mg | ORAL_TABLET | Freq: Every evening | ORAL | Status: DC | PRN

## 2016-02-04 MED ORDER — METOCLOPRAMIDE HCL 10 MG PO TABS
10.0000 mg | ORAL_TABLET | Freq: Three times a day (TID) | ORAL | Status: DC
  Administered 2016-02-04 (×3): 10 mg via ORAL
  Filled 2016-02-04 (×3): qty 1

## 2016-02-04 MED ORDER — METRONIDAZOLE 500 MG PO TABS
500.0000 mg | ORAL_TABLET | Freq: Three times a day (TID) | ORAL | Status: DC
  Administered 2016-02-04 (×2): 500 mg via ORAL
  Filled 2016-02-04 (×2): qty 1

## 2016-02-04 NOTE — Care Management Obs Status (Signed)
[facility] NOTIFICATION   Patient Details  Name: [name] MRN: [id number] Date of Birth: 16-Sep-1950   Medicare Observation Status Notification Given:  Yes    Guadalupe Maple, RN 02/04/2016, 4:04 PM

## 2016-02-04 NOTE — Progress Notes (Signed)
Spoke with patient at bedside. Patient states she lives at home with her daughter, daughter provides transportation and assistance with meals. Patient manages own medications, no issues getting medications. Patient does not use any assistive devices for ambulation, has a shower chair, able to manage ADL's. Has PCP that she sees, follows up with GI MD as well. Patient awaiting orthostatics and will ambulate with staff to see how she feels. She is concerned about recent dizziness but declines need for assistive device.

## 2016-02-04 NOTE — Progress Notes (Signed)
  Echocardiogram 2D Echocardiogram has been performed.  Darlina Sicilian M 02/04/2016, 1:27 PM

## 2016-02-04 NOTE — Progress Notes (Signed)
VASCULAR LAB PRELIMINARY  PRELIMINARY  PRELIMINARY  PRELIMINARY  Carotid duplex has been completed.     Bilateral- No evidence of stenosis in ICA's. Vertebral arteries are antegrade.   Janifer Adie, RVT, RDMS 02/04/2016, 9:37 AM

## 2016-02-05 LAB — VAS US CAROTID
LCCAPDIAS: 16 cm/s
LCCAPSYS: 119 cm/s
LEFT ECA DIAS: -9 cm/s
LEFT VERTEBRAL DIAS: 17 cm/s
LICAPDIAS: -27 cm/s
Left CCA dist dias: -17 cm/s
Left CCA dist sys: -98 cm/s
Left ICA dist dias: -26 cm/s
Left ICA dist sys: -84 cm/s
Left ICA prox sys: -90 cm/s
RCCADSYS: -74 cm/s
RCCAPDIAS: -17 cm/s
RIGHT ECA DIAS: -13 cm/s
RIGHT VERTEBRAL DIAS: 19 cm/s
Right CCA prox sys: -100 cm/s

## 2016-02-05 NOTE — Discharge Summary (Signed)
Physician Discharge Summary  [name] OMB:[id number] DOB: [birth year] DOA: 02/03/2016  PCP: Jessica Held, DO  Admit date: 02/03/2016 Discharge date: 02/05/2016  Admitted From: home  Disposition:  Home   Recommendations for Outpatient Follow-up:  1. Follow up with PCP in 1-2 weeks  Discharge Condition: stable CODE STATUS: Full Diet recommendation: regular  HPI: [name] is a 65 y.o. female with medical history significant of stomach cancer on remission, S/P partial gastrectomy and chemotherapy, hypertension, type 2 diabetes, diabetic neuropathy, CKD, morbid obesity, sleep apnea not on CPAP, asthma, hyperlipidemia, GERD who was referred from [place] due to having a syncopal episode at her doctor's office at the same facility. She denies having chest pain, palpitations, dizziness, diaphoresis or any other symptoms prior to losing consciousness.  Hospital Course: Discharge Diagnoses:  Principal Problem:   Syncope Active Problems:   Hyperlipidemia   Essential hypertension   Asthma   GERD (gastroesophageal reflux disease)   MDD (major depressive disorder), recurrent episode, moderate (HCC)   Hypotension   Hypomagnesemia   AKI (acute kidney injury) ([place])  Syncope - Patient was admitted to the hospital with brief syncopal episode. She was hypotensive on admission and started on IVF. She was monitored on telemetry without significant events. She underwent carotid doppler study without evidence of stenosis, and a 2D echo which showed normal EF and grade 1 DD (full report below). She improved with IVF and she was able to ambulate without further lightheadedness or dizziness. Given likely dehydration contributing to her syncopal episode, I discontinued her HCTZ and advised patient to weigh herself daily to monitor if she would get fluid overloaded. Her Coreg was decreased in half. Recommended close PCP follow up for re-evaluation. HTN - as above her home  medications adjusted AKI (acute kidney injury) ([place]) - Likely secondary to volume depletion, improved with IVF Hyperlipidemia  Asthma GERD (gastroesophageal reflux disease) - Pantoprazole 40 mg by mouth daily. MDD (major depressive disorder) - Continue Duloxetine 60 mg by mouth twice a day   Discharge Instructions     Medication List    STOP taking these medications        hydrochlorothiazide 25 MG tablet  Commonly known as:  HYDRODIURIL      TAKE these medications        aspirin 81 MG tablet  Take 81 mg by mouth at bedtime.     carvedilol 12.5 MG tablet  Commonly known as:  COREG  Take 0.5 tablets (6.25 mg total) by mouth 2 (two) times daily with a meal.     ciprofloxacin 500 MG tablet  Commonly known as:  CIPRO  Take 1 tablet (500 mg total) by mouth 2 (two) times daily.     DULoxetine 60 MG capsule  Commonly known as:  CYMBALTA  Take 1 capsule (60 mg total) by mouth 2 (two) times daily.     EMBEDA 30-1.2 MG Cpcr  Generic drug:  Morphine-Naltrexone  Take 1 tablet by mouth at bedtime     glimepiride 4 MG tablet  Commonly known as:  AMARYL  TAKE 1 TABLET BY MOUTH TWICE A DAY     hyoscyamine 0.125 MG SL tablet  Commonly known as:  LEVSIN SL  Place 1 tablet (0.125 mg total) under the tongue every 6 (six) hours as needed (abdominal pain).     lidocaine-prilocaine cream  Commonly known as:  EMLA  Apply 1 application topically as needed.     LORazepam 0.5 MG  tablet  Commonly known as:  ATIVAN  Take 1 tablet (0.5 mg total) by mouth at bedtime as needed for anxiety.     metaxalone 800 MG tablet  Commonly known as:  SKELAXIN  Take 1 tablet (800 mg total) by mouth 4 (four) times daily.     metFORMIN 850 MG tablet  Commonly known as:  GLUCOPHAGE  Take 1 tablet (850 mg total) by mouth 2 (two) times daily. Repeat labs are due now     metoCLOPramide 5 MG tablet  Commonly known as:  REGLAN  Take 1 tablet (5 mg total) by mouth every 8 (eight) hours as needed for  nausea or vomiting.     metroNIDAZOLE 500 MG tablet  Commonly known as:  FLAGYL  Take 1 tablet (500 mg total) by mouth 3 (three) times daily.     morphine 15 MG tablet  Commonly known as:  MSIR  Take 15 mg three times daily as needed for pain     multivitamin with minerals Tabs tablet  Take 1 tablet by mouth daily.     ondansetron 8 MG disintegrating tablet  Commonly known as:  ZOFRAN ODT  Take 1 tablet (8 mg total) by mouth every 8 (eight) hours as needed for nausea or vomiting.     pantoprazole 40 MG tablet  Commonly known as:  PROTONIX  Take 1 tablet (40 mg total) by mouth daily.           Follow-up Information    Follow up with Jessica Held, DO. Schedule an appointment as soon as possible for a visit in 1 week.   Specialty:  Family Medicine   Contact information:   [address] [place] [address] [phone]      Allergies  Allergen Reactions  . Adhesive [Tape] Other (See Comments)    Burn Skin  . Codeine Other (See Comments)    paranoid  . Zocor [Simvastatin - High Dose] Other (See Comments)    Muscle spasms  . Penicillins Rash    Has patient had a PCN reaction causing immediate rash, facial/tongue/throat swelling, SOB or lightheadedness with hypotension: Yes Has patient had a PCN reaction causing severe rash involving mucus membranes or skin necrosis: No Has patient had a PCN reaction that required hospitalization does not remember.  Has patient had a PCN reaction occurring within the last 10 years: No If all of the above answers are "NO", then may proceed with Cephalosporin use.     Consultations:  none  Procedures/Studies:  2D echo  Study Conclusions - Left ventricle: The cavity size was normal. Wall thickness was increased in a pattern of moderate LVH. Systolic function was normal. The estimated ejection fraction was in the range of 50% to 55%. Wall motion was normal; there were no regional wall motion abnormalities.  Doppler parameters are consistent with abnormal left ventricular relaxation (grade 1 diastolic dysfunction).  Dg Chest 2 View  02/03/2016  CLINICAL DATA:  Syncope today.  Not feeling well EXAM: CHEST  2 VIEW COMPARISON:  01/12/2016 FINDINGS: There is a right-sided Port-A-Cath with the tip projecting over the SVC. There is no focal parenchymal opacity. There is no pleural effusion or pneumothorax. The heart and mediastinal contours are unremarkable. The osseous structures are unremarkable. IMPRESSION: No active cardiopulmonary disease. Electronically Signed   By: Kathreen Devoid   On: 02/03/2016 19:28   Ct Head Wo Contrast  02/03/2016  CLINICAL DATA:  65 year old female with syncope, fall and  head and neck injury. Headache and cervical spine pain. EXAM: CT HEAD WITHOUT CONTRAST CT CERVICAL SPINE WITHOUT CONTRAST TECHNIQUE: Multidetector CT imaging of the head and cervical spine was performed following the standard protocol without intravenous contrast. Multiplanar CT image reconstructions of the cervical spine were also generated. COMPARISON:  02/17/2012 head CT and prior exams FINDINGS: CT HEAD FINDINGS No intracranial abnormalities are identified, including mass lesion or mass effect, hydrocephalus, extra-axial fluid collection, midline shift, hemorrhage, or acute infarction. The visualized bony calvarium is unremarkable. CT CERVICAL SPINE FINDINGS There is no evidence of fracture, subluxation or prevertebral soft tissue swelling. Mild multilevel degenerative disc disease and spondylosis noted. No bony lesions are present. The soft tissues are unremarkable. IMPRESSION: Unremarkable noncontrast head CT. No static evidence of acute injury to the cervical spine. Electronically Signed   By: Margarette Canada M.D.   On: 02/03/2016 17:29   Ct Cervical Spine Wo Contrast  02/03/2016  CLINICAL DATA:  65 year old female with syncope, fall and head and neck injury. Headache and cervical spine pain. EXAM: CT HEAD WITHOUT  CONTRAST CT CERVICAL SPINE WITHOUT CONTRAST TECHNIQUE: Multidetector CT imaging of the head and cervical spine was performed following the standard protocol without intravenous contrast. Multiplanar CT image reconstructions of the cervical spine were also generated. COMPARISON:  02/17/2012 head CT and prior exams FINDINGS: CT HEAD FINDINGS No intracranial abnormalities are identified, including mass lesion or mass effect, hydrocephalus, extra-axial fluid collection, midline shift, hemorrhage, or acute infarction. The visualized bony calvarium is unremarkable. CT CERVICAL SPINE FINDINGS There is no evidence of fracture, subluxation or prevertebral soft tissue swelling. Mild multilevel degenerative disc disease and spondylosis noted. No bony lesions are present. The soft tissues are unremarkable. IMPRESSION: Unremarkable noncontrast head CT. No static evidence of acute injury to the cervical spine. Electronically Signed   By: Margarette Canada M.D.   On: 02/03/2016 17:29   Ct Angio Abdomen W/cm &/or Wo Contrast  01/13/2016  CLINICAL DATA:  Generalized abdominal pain for the past week. One episode of diarrhea last night. Her history of gastric cancer diagnosed in 2010. Hypertension. Diabetes. Hyperlipidemia. EXAM: CT ANGIOGRAPHY ABDOMEN TECHNIQUE: Multidetector CT imaging of the abdomen was performed using the standard protocol during bolus administration of intravenous contrast. Multiplanar reconstructed images including MIPs were obtained and reviewed to evaluate the vascular anatomy. CONTRAST:  80 cc of Isovue 370 COMPARISON:  Plain films of 1 day prior.  CT of 01/04/2016. FINDINGS: Lower chest: Clear lung bases. Tiny hiatal hernia. Mild cardiomegaly, without pericardial or pleural effusion. Hepatobiliary: Marked hepatic steatosis with relatively mild hepatomegaly, 18.3 cm craniocaudal. Mild sparing in the posterior aspect of segment 4. Normal gallbladder, without biliary ductal dilatation. Pancreas: Moderate  pancreatic atrophy, without duct dilatation or dominant mass. Spleen: Normal in size, without focal abnormality. Adrenals/Urinary Tract: Normal adrenal glands. Renal scarring, worse on the left. Decreased perfusion and function involving the upper pole left kidney. No hydronephrosis. Degraded evaluation of the pelvis, secondary to beam hardening artifact from right hip arthroplasty. Urinary bladder not well evaluated. Stomach/Bowel: Surgical changes of partial gastrectomy. The proximal stomach is underdistended. No dominant gastric mass identified. Suspect prior gastrojejunostomy. Scattered colonic diverticula. Muscular hypertrophy involving the sigmoid. Underdistention throughout the majority of the colon. Normal small bowel. Vascular/Lymphatic: Aorta is normal in caliber. Atherosclerosis. celiac is widely patent. The superior mesenteric artery is within normal limits. The inferior mesenteric artery is patent proximally. No significant stenosis involving the renal arteries. The pelvic vasculature is not well opacified, but no aneurysm is  seen. No abdominal and no gross pelvic adenopathy. Reproductive: Normal imaged uterus. Other: No gross free pelvic fluid. Suspect prior ventral abdominal wall hernia repair, including on image 47/series 4. Musculoskeletal: Trace L4-5 anterolisthesis. Thoracolumbar spondylosis. Review of the MIP images confirms the above findings. IMPRESSION: 1. Atherosclerosis, without mesenteric stenosis or thrombosis. 2.  No acute process in the abdomen or pelvis. 3. Marked hepatic steatosis. 4. Small hiatal hernia. Partial gastrectomy with probable gastrojejunostomy. No evidence of metastatic disease. 5. Scarred kidneys, worse on the left. 6. Degraded evaluation of the pelvis, secondary to beam hardening artifact from right hip arthroplasty. Electronically Signed   By: Abigail Miyamoto M.D.   On: 01/13/2016 21:22   US Abdomen Complete  01/15/2016  CLINICAL DATA:  Abdominal pain x2 weeks.  Stomach  carcinoma. EXAM: COMPLETE ABDOMINAL ULTRASOUND COMPARISON:  CT 01/13/2016 FINDINGS: Gallbladder: Physiologically distended without stones, wall thickening, or pericholecystic fluid. Sonographer reports no sonographic Murphy's sign. Common bile duct:  Normal in caliber, 5.33m diameter. Liver: Coarse echotexture without focal lesion or intrahepatic biliary ductal dilatation. IVC:  Negative Pancreas: Visualized segments unremarkable, portions obscured by overlying bowel gas. Spleen:  No focal lesion, craniocaudal 3.0cm in length. Right Kidney:  No mass or hydronephrosis, 11.6cm in length. Left Kidney:  No lesion or hydronephrosis, 10.1cm in length. Abdominal aorta:  Negative IMPRESSION: Negative.  Normal gallbladder. Electronically Signed   By: DLucrezia EuropeM.D.   On: 01/15/2016 17:06   Dg Abd Acute W/chest  01/13/2016  CLINICAL DATA:  Acute onset of diarrhea and generalized abdominal pain. Initial encounter. EXAM: DG ABDOMEN ACUTE W/ 1V CHEST COMPARISON:  Chest radiograph performed 11/23/2015, and CT of the abdomen and pelvis from 01/04/2016 FINDINGS: The lungs are well-aerated and clear. There is no evidence of focal opacification, pleural effusion or pneumothorax. The cardiomediastinal silhouette is within normal limits. A right-sided chest port is noted ending about the mid SVC. The visualized bowel gas pattern is unremarkable. Scattered stool and air are seen within the colon; there is no evidence of small bowel dilatation to suggest obstruction. No free intra-abdominal air is identified on the provided upright view. No acute osseous abnormalities are seen; the sacroiliac joints are unremarkable in appearance. A right-sided hip arthroplasty is grossly unremarkable in appearance, without evidence of loosening, though incompletely imaged. IMPRESSION: 1. Unremarkable bowel gas pattern; no free intra-abdominal air seen. 2. No acute cardiopulmonary process seen. Electronically Signed   By: JGarald BaldingM.D.   On:  01/13/2016 00:25      Subjective: - no chest pain, shortness of breath, no abdominal pain, nausea or vomiting.    Discharge Exam: Filed Vitals:   02/04/16 0918 02/04/16 1508  BP: 106/58 97/63  Pulse: 78 77  Temp: 98 F (36.7 C) 98.4 F (36.9 C)  Resp: 18 18   Filed Vitals:   02/04/16 0504 02/04/16 0806 02/04/16 0918 02/04/16 1508  BP: 119/66  106/58 97/63  Pulse: 65  78 77  Temp: 97.8 F (36.6 C)  98 F (36.7 C) 98.4 F (36.9 C)  TempSrc: Oral  Oral Oral  Resp: 18  18 18   Height:      Weight:  105.9 kg (233 lb 7.5 oz)    SpO2: 99%  96% 95%    General: Pt is alert, awake, not in acute distress Cardiovascular: RRR, S1/S2 +, no rubs, no gallops Respiratory: CTA bilaterally, no wheezing, no rhonchi Abdominal: Soft, NT, ND, bowel sounds +    The results of significant diagnostics from this hospitalization (  including imaging, microbiology, ancillary and laboratory) are listed below for reference.     Microbiology: No results found for this or any previous visit (from the past 240 hour(s)).   Labs: BNP (last 3 results) No results for input(s): BNP in the last 8760 hours. Basic Metabolic Panel:  Recent Labs Lab 02/03/16 1540 02/04/16 0513  NA 138 141  K 4.3 3.8  CL 102 110  CO2 28 25  GLUCOSE 241* 131*  BUN 11 10  CREATININE 1.96* 1.63*  CALCIUM 8.5* 7.8*  MG 1.2*  --   PHOS 3.6  --    Liver Function Tests:  Recent Labs Lab 02/03/16 1540 02/04/16 0513  AST 63* 56*  ALT 43 37  ALKPHOS 77 65  BILITOT 0.6 0.7  PROT 6.1* 5.4*  ALBUMIN 2.8* 2.5*    Recent Labs Lab 02/03/16 1540  LIPASE <10*   No results for input(s): AMMONIA in the last 168 hours. CBC:  Recent Labs Lab 02/03/16 1540 02/04/16 0513  WBC 7.6 7.1  NEUTROABS  --  2.3  HGB 12.4 11.3*  HCT 35.6* 32.9*  MCV 87.5 88.2  PLT 332 323   Cardiac Enzymes:  Recent Labs Lab 02/03/16 1540 02/03/16 2342 02/04/16 0513  TROPONINI <0.03 <0.03 <0.03   BNP: Invalid input(s):  POCBNP CBG:  Recent Labs Lab 02/04/16 0719 02/04/16 1144 02/04/16 1657  GLUCAP 122* 198* 296*   D-Dimer  Recent Labs  02/03/16 1540  DDIMER 0.85*   Hgb A1c No results for input(s): HGBA1C in the last 72 hours. Lipid Profile No results for input(s): CHOL, HDL, LDLCALC, TRIG, CHOLHDL, LDLDIRECT in the last 72 hours. Thyroid function studies No results for input(s): TSH, T4TOTAL, T3FREE, THYROIDAB in the last 72 hours.  Invalid input(s): FREET3 Anemia work up No results for input(s): VITAMINB12, FOLATE, FERRITIN, TIBC, IRON, RETICCTPCT in the last 72 hours. Urinalysis    Component Value Date/Time   COLORURINE YELLOW 02/04/2016 0511   APPEARANCEUR CLOUDY* 02/04/2016 0511   LABSPEC 1.009 02/04/2016 0511   LABSPEC 1.015 04/06/2015 0939   PHURINE 5.0 02/04/2016 0511   GLUCOSEU NEGATIVE 02/04/2016 0511   HGBUR NEGATIVE 02/04/2016 0511   BILIRUBINUR NEGATIVE 02/04/2016 0511   BILIRUBINUR 2+ 01/04/2016 1619   KETONESUR NEGATIVE 02/04/2016 0511   PROTEINUR NEGATIVE 02/04/2016 0511   PROTEINUR trace 01/04/2016 1619   UROBILINOGEN 1.0 01/04/2016 1619   UROBILINOGEN 0.2 04/06/2015 0939   UROBILINOGEN 1.0 12/09/2013 1655   NITRITE NEGATIVE 02/04/2016 0511   NITRITE neg 01/04/2016 1619   LEUKOCYTESUR NEGATIVE 02/04/2016 0511   Sepsis Labs Invalid input(s): PROCALCITONIN,  WBC,  LACTICIDVEN Microbiology No results found for this or any previous visit (from the past 240 hour(s)).   Time coordinating discharge: Over 30 minutes  SIGNED:  Marzetta Board, MD  [facility] 02/05/2016, 11:31 AM Pager [phone]  If 7PM-7AM, please contact night-coverage [email or web address] Password [email or web address]

## 2016-02-08 ENCOUNTER — Telehealth: Payer: Self-pay | Admitting: Behavioral Health

## 2016-02-08 NOTE — Telephone Encounter (Signed)
Attempted to reach patient for TCM/Hospital Follow-up call. Left message for patient to return call when available.    

## 2016-02-09 ENCOUNTER — Encounter: Payer: Self-pay | Admitting: Family Medicine

## 2016-02-09 ENCOUNTER — Ambulatory Visit (INDEPENDENT_AMBULATORY_CARE_PROVIDER_SITE_OTHER): Payer: Medicare HMO | Admitting: Family Medicine

## 2016-02-09 VITALS — BP 108/62 | HR 73 | Temp 98.4°F | Ht 67.0 in | Wt 227.8 lb

## 2016-02-09 DIAGNOSIS — E1142 Type 2 diabetes mellitus with diabetic polyneuropathy: Secondary | ICD-10-CM | POA: Diagnosis not present

## 2016-02-09 DIAGNOSIS — E1165 Type 2 diabetes mellitus with hyperglycemia: Secondary | ICD-10-CM

## 2016-02-09 DIAGNOSIS — I10 Essential (primary) hypertension: Secondary | ICD-10-CM | POA: Diagnosis not present

## 2016-02-09 DIAGNOSIS — E042 Nontoxic multinodular goiter: Secondary | ICD-10-CM | POA: Diagnosis not present

## 2016-02-09 DIAGNOSIS — E1151 Type 2 diabetes mellitus with diabetic peripheral angiopathy without gangrene: Secondary | ICD-10-CM | POA: Diagnosis not present

## 2016-02-09 DIAGNOSIS — IMO0002 Reserved for concepts with insufficient information to code with codable children: Secondary | ICD-10-CM

## 2016-02-09 MED ORDER — CARVEDILOL 6.25 MG PO TABS: 6.2500 mg | ORAL_TABLET | Freq: Two times a day (BID) | ORAL | Status: DC

## 2016-02-09 MED ORDER — GLIMEPIRIDE 4 MG PO TABS: 4.0000 mg | ORAL_TABLET | Freq: Two times a day (BID) | ORAL | Status: DC

## 2016-02-09 NOTE — Telephone Encounter (Signed)
Left message x 2 for TCM/Hospital Follow-up call.

## 2016-02-09 NOTE — Progress Notes (Signed)
Pre visit review using our clinic review tool, if applicable. No additional management support is needed unless otherwise documented below in the visit note. 

## 2016-02-09 NOTE — Patient Instructions (Signed)

## 2016-02-10 ENCOUNTER — Ambulatory Visit: Payer: Self-pay | Admitting: Medical

## 2016-02-10 ENCOUNTER — Telehealth: Payer: Self-pay | Admitting: Family Medicine

## 2016-02-10 NOTE — Telephone Encounter (Signed)
Patient will schedule at a later date

## 2016-02-10 NOTE — Telephone Encounter (Signed)
Please reverse the No Show fee on 02/03/2016. Patient was in the office but was ill and had to be sent to the ER.

## 2016-02-10 NOTE — Assessment & Plan Note (Signed)
hctz was stopped and coreg dose was cut in half bp was low She never cut the coreg dose-- but she will start

## 2016-02-10 NOTE — Assessment & Plan Note (Signed)
F/u endo

## 2016-02-10 NOTE — Progress Notes (Signed)
Patient ID: [name], female    DOB: [birth year]  Age: 65 y.o. MRN: [id number]    Subjective:  Subjective HPI [name] presents for f/u from hospital for syncope. Pt was in hosp 7/12-7/14.  She was found to be dehydrated-- hctz was stopped and coreg was cut in half.    Review of Systems  Constitutional: Negative for diaphoresis, appetite change, fatigue and unexpected weight change.  Eyes: Negative for pain, redness and visual disturbance.  Respiratory: Negative for cough, chest tightness, shortness of breath and wheezing.   Cardiovascular: Negative for chest pain, palpitations and leg swelling.  Endocrine: Negative for cold intolerance, heat intolerance, polydipsia, polyphagia and polyuria.  Genitourinary: Negative for dysuria, frequency and difficulty urinating.  Neurological: Negative for dizziness, light-headedness, numbness and headaches.    History Past Medical History  Diagnosis Date  . stomach ca dx'd 2010    chemo/xrt comp 05/2009  . Hypertension     actually taking for DM renal protection, not DM  . Diabetes mellitus   . Asthma   . Sleep apnea   . Degenerative joint disease   . Hyperlipidemia     no longer on medication for this  . GERD (gastroesophageal reflux disease)   . Neuropathy ([place])     bilateral hands and feet  . Sickle cell trait ([place])     She has past surgical history that includes Total knee arthroplasty; Total hip arthroplasty; Shoulder surgery; Appendectomy; Cesarean section; Hernia repair; Colon surgery; Esophagogastroduodenoscopy (N/A, 10/15/2012); and Esophagogastroduodenoscopy (N/A, 01/15/2016).   Her family history includes Alzheimer's disease (age of onset: 62) in her father; Cancer in her maternal grandfather; Cancer (age of onset: 38) in her mother; Diabetes in her brother and maternal grandmother. There is no history of Suicidality, Depression, Dementia, or Anxiety disorder.She reports that she quit smoking about 27 years ago. Her smoking  use included Cigarettes. She started smoking about 42 years ago. She has a 15 pack-year smoking history. She has never used smokeless tobacco. She reports that she does not drink alcohol or use illicit drugs.  Current Outpatient Prescriptions on File Prior to Visit  Medication Sig Dispense Refill  . aspirin 81 MG tablet Take 81 mg by mouth at bedtime.    . DULoxetine (CYMBALTA) 60 MG capsule Take 1 capsule (60 mg total) by mouth 2 (two) times daily. 180 capsule 3  . EMBEDA 30-1.2 MG CPCR Take 1 tablet by mouth at bedtime  0  . hyoscyamine (LEVSIN SL) 0.125 MG SL tablet Place 1 tablet (0.125 mg total) under the tongue every 6 (six) hours as needed (abdominal pain). 30 tablet 0  . lidocaine-prilocaine (EMLA) cream Apply 1 application topically as needed. 30 g 5  . LORazepam (ATIVAN) 0.5 MG tablet Take 1 tablet (0.5 mg total) by mouth at bedtime as needed for anxiety. 30 tablet 0  . metaxalone (SKELAXIN) 800 MG tablet Take 1 tablet (800 mg total) by mouth 4 (four) times daily. (Patient taking differently: Take 800 mg by mouth 4 (four) times daily as needed for muscle spasms. ) 30 tablet 0  . metFORMIN (GLUCOPHAGE) 850 MG tablet Take 1 tablet (850 mg total) by mouth 2 (two) times daily. Repeat labs are due now 180 tablet 1  . metoCLOPramide (REGLAN) 5 MG tablet Take 1 tablet (5 mg total) by mouth every 8 (eight) hours as needed for nausea or vomiting. 30 tablet 0  . morphine (MSIR) 15 MG tablet Take 15 mg three times daily as needed for  pain  0  . Multiple Vitamin (MULTIVITAMIN WITH MINERALS) TABS tablet Take 1 tablet by mouth daily.    . ondansetron (ZOFRAN ODT) 8 MG disintegrating tablet Take 1 tablet (8 mg total) by mouth every 8 (eight) hours as needed for nausea or vomiting. 20 tablet 0  . pantoprazole (PROTONIX) 40 MG tablet Take 1 tablet (40 mg total) by mouth daily. 30 tablet 0   No current facility-administered medications on file prior to visit.     Objective:  Objective Physical Exam    Constitutional: She is oriented to person, place, and time. She appears well-developed and well-nourished.  HENT:  Head: Normocephalic and atraumatic.  Eyes: Conjunctivae and EOM are normal.  Neck: Normal range of motion. Neck supple. No JVD present. Carotid bruit is not present. No thyromegaly present.  Cardiovascular: Normal rate, regular rhythm and normal heart sounds.   No murmur heard. Pulmonary/Chest: Effort normal and breath sounds normal. No respiratory distress. She has no wheezes. She has no rales. She exhibits no tenderness.  Musculoskeletal: She exhibits no edema.  Neurological: She is alert and oriented to person, place, and time.  Psychiatric: She has a normal mood and affect.  Nursing note and vitals reviewed.  BP 108/62 mmHg  Pulse 73  Temp(Src) 98.4 F (36.9 C) (Oral)  Ht 5' 7"  (1.702 m)  Wt 227 lb 12.8 oz (103.329 kg)  BMI 35.67 kg/m2 Wt Readings from Last 3 Encounters:  02/09/16 227 lb 12.8 oz (103.329 kg)  02/04/16 233 lb 7.5 oz (105.9 kg)  01/25/16 225 lb 3.2 oz (102.15 kg)     Lab Results  Component Value Date   WBC 7.1 02/04/2016   HGB 11.3* 02/04/2016   HCT 32.9* 02/04/2016   PLT 323 02/04/2016   GLUCOSE 131* 02/04/2016   CHOL 185 07/07/2015   TRIG 144 07/07/2015   HDL 58 07/07/2015   LDLCALC 98 07/07/2015   ALT 37 02/04/2016   AST 56* 02/04/2016   NA 141 02/04/2016   K 3.8 02/04/2016   CL 110 02/04/2016   CREATININE 1.63* 02/04/2016   BUN 10 02/04/2016   CO2 25 02/04/2016   TSH 4.112 01/15/2016   INR 0.98 11/05/2010   HGBA1C 11.2* 12/30/2015   MICROALBUR 0.3 04/06/2015    Dg Chest 2 View  02/03/2016  CLINICAL DATA:  Syncope today.  Not feeling well EXAM: CHEST  2 VIEW COMPARISON:  01/12/2016 FINDINGS: There is a right-sided Port-A-Cath with the tip projecting over the SVC. There is no focal parenchymal opacity. There is no pleural effusion or pneumothorax. The heart and mediastinal contours are unremarkable. The osseous structures are  unremarkable. IMPRESSION: No active cardiopulmonary disease. Electronically Signed   By: Kathreen Devoid   On: 02/03/2016 19:28   Ct Head Wo Contrast  02/03/2016  CLINICAL DATA:  65 year old female with syncope, fall and head and neck injury. Headache and cervical spine pain. EXAM: CT HEAD WITHOUT CONTRAST CT CERVICAL SPINE WITHOUT CONTRAST TECHNIQUE: Multidetector CT imaging of the head and cervical spine was performed following the standard protocol without intravenous contrast. Multiplanar CT image reconstructions of the cervical spine were also generated. COMPARISON:  02/17/2012 head CT and prior exams FINDINGS: CT HEAD FINDINGS No intracranial abnormalities are identified, including mass lesion or mass effect, hydrocephalus, extra-axial fluid collection, midline shift, hemorrhage, or acute infarction. The visualized bony calvarium is unremarkable. CT CERVICAL SPINE FINDINGS There is no evidence of fracture, subluxation or prevertebral soft tissue swelling. Mild multilevel degenerative disc disease and spondylosis noted.  No bony lesions are present. The soft tissues are unremarkable. IMPRESSION: Unremarkable noncontrast head CT. No static evidence of acute injury to the cervical spine. Electronically Signed   By: Margarette Canada M.D.   On: 02/03/2016 17:29   Ct Cervical Spine Wo Contrast  02/03/2016  CLINICAL DATA:  65 year old female with syncope, fall and head and neck injury. Headache and cervical spine pain. EXAM: CT HEAD WITHOUT CONTRAST CT CERVICAL SPINE WITHOUT CONTRAST TECHNIQUE: Multidetector CT imaging of the head and cervical spine was performed following the standard protocol without intravenous contrast. Multiplanar CT image reconstructions of the cervical spine were also generated. COMPARISON:  02/17/2012 head CT and prior exams FINDINGS: CT HEAD FINDINGS No intracranial abnormalities are identified, including mass lesion or mass effect, hydrocephalus, extra-axial fluid collection, midline shift,  hemorrhage, or acute infarction. The visualized bony calvarium is unremarkable. CT CERVICAL SPINE FINDINGS There is no evidence of fracture, subluxation or prevertebral soft tissue swelling. Mild multilevel degenerative disc disease and spondylosis noted. No bony lesions are present. The soft tissues are unremarkable. IMPRESSION: Unremarkable noncontrast head CT. No static evidence of acute injury to the cervical spine. Electronically Signed   By: Margarette Canada M.D.   On: 02/03/2016 17:29     Assessment & Plan:  Plan I have discontinued Ms. Kempa's ciprofloxacin, metroNIDAZOLE, and carvedilol. I am also having her start on carvedilol. Additionally, I am having her maintain her multivitamin with minerals, aspirin, metaxalone, DULoxetine, lidocaine-prilocaine, LORazepam, morphine, EMBEDA, metFORMIN, pantoprazole, hyoscyamine, metoCLOPramide, ondansetron, and glimepiride.  Meds ordered this encounter  Medications  . DISCONTD: glimepiride (AMARYL) 4 MG tablet    Sig: Take 1 tablet (4 mg total) by mouth 2 (two) times daily.    Dispense:  180 tablet    Refill:  1  . carvedilol (COREG) 6.25 MG tablet    Sig: Take 1 tablet (6.25 mg total) by mouth 2 (two) times daily with a meal.    Dispense:  180 tablet    Refill:  1  . glimepiride (AMARYL) 4 MG tablet    Sig: Take 1 tablet (4 mg total) by mouth 2 (two) times daily.    Dispense:  60 tablet    Refill:  0    Problem List Items Addressed This Visit      Unprioritized   Essential hypertension    hctz was stopped and coreg dose was cut in half bp was low She never cut the coreg dose-- but she will start      Relevant Medications   carvedilol (COREG) 6.25 MG tablet   Multinodular goiter (nontoxic) (Chronic)    F/u endo      Relevant Medications   carvedilol (COREG) 6.25 MG tablet   Uncontrolled type 2 diabetes mellitus with diabetic polyneuropathy, without long-term current use of insulin (HCC)    F/u endo       Relevant Medications    glimepiride (AMARYL) 4 MG tablet    Other Visit Diagnoses    DM (diabetes mellitus) type II uncontrolled, periph vascular disorder ([place])    -  Primary    Relevant Medications    carvedilol (COREG) 6.25 MG tablet    glimepiride (AMARYL) 4 MG tablet       Follow-up: Return in about 3 months (around 05/11/2016), or if symptoms worsen or fail to improve, for hypertension, hyperlipidemia.  Ann Held, DO

## 2016-02-12 ENCOUNTER — Other Ambulatory Visit: Payer: Self-pay

## 2016-02-12 ENCOUNTER — Ambulatory Visit: Payer: Medicare HMO

## 2016-02-12 ENCOUNTER — Other Ambulatory Visit (HOSPITAL_BASED_OUTPATIENT_CLINIC_OR_DEPARTMENT_OTHER): Payer: Medicare HMO

## 2016-02-12 ENCOUNTER — Ambulatory Visit (HOSPITAL_BASED_OUTPATIENT_CLINIC_OR_DEPARTMENT_OTHER): Payer: Medicare HMO | Admitting: Hematology & Oncology

## 2016-02-12 VITALS — BP 97/62 | HR 102 | Temp 97.5°F | Resp 20 | Wt 230.8 lb

## 2016-02-12 DIAGNOSIS — Z85028 Personal history of other malignant neoplasm of stomach: Secondary | ICD-10-CM

## 2016-02-12 DIAGNOSIS — R109 Unspecified abdominal pain: Secondary | ICD-10-CM | POA: Diagnosis not present

## 2016-02-12 DIAGNOSIS — D509 Iron deficiency anemia, unspecified: Secondary | ICD-10-CM

## 2016-02-12 DIAGNOSIS — K219 Gastro-esophageal reflux disease without esophagitis: Secondary | ICD-10-CM

## 2016-02-12 DIAGNOSIS — K589 Irritable bowel syndrome without diarrhea: Secondary | ICD-10-CM

## 2016-02-12 DIAGNOSIS — E042 Nontoxic multinodular goiter: Secondary | ICD-10-CM

## 2016-02-12 DIAGNOSIS — C168 Malignant neoplasm of overlapping sites of stomach: Secondary | ICD-10-CM

## 2016-02-12 DIAGNOSIS — C162 Malignant neoplasm of body of stomach: Secondary | ICD-10-CM

## 2016-02-12 LAB — COMPREHENSIVE METABOLIC PANEL
ALT: 21 U/L (ref 0–55)
AST: 31 U/L (ref 5–34)
Albumin: 2.8 g/dL — ABNORMAL LOW (ref 3.5–5.0)
Alkaline Phosphatase: 71 U/L (ref 40–150)
Anion Gap: 11 mEq/L (ref 3–11)
BUN: 20.8 mg/dL (ref 7.0–26.0)
CALCIUM: 8.6 mg/dL (ref 8.4–10.4)
CHLORIDE: 107 meq/L (ref 98–109)
CO2: 24 meq/L (ref 22–29)
Creatinine: 1.6 mg/dL — ABNORMAL HIGH (ref 0.6–1.1)
EGFR: 40 mL/min/{1.73_m2} — ABNORMAL LOW (ref >90)
Glucose: 97 mg/dl (ref 70–140)
POTASSIUM: 4.5 meq/L (ref 3.5–5.1)
SODIUM: 142 meq/L (ref 136–145)
Total Bilirubin: 0.49 mg/dL (ref 0.20–1.20)
Total Protein: 6.3 g/dL — ABNORMAL LOW (ref 6.4–8.3)

## 2016-02-12 LAB — CBC WITH DIFFERENTIAL (CANCER CENTER ONLY)
BASO#: 0 10*3/uL (ref 0.0–0.2)
BASO%: 0.5 % (ref 0.0–2.0)
EOS%: 6.8 % (ref 0.0–7.0)
Eosinophils Absolute: 0.5 10*3/uL (ref 0.0–0.5)
HCT: 34.8 % (ref 34.8–46.6)
HGB: 12.1 g/dL (ref 11.6–15.9)
LYMPH#: 3.1 10*3/uL (ref 0.9–3.3)
LYMPH%: 40.5 % (ref 14.0–48.0)
MCH: 30.6 pg (ref 26.0–34.0)
MCHC: 34.8 g/dL (ref 32.0–36.0)
MCV: 88 fL (ref 81–101)
MONO#: 0.9 10*3/uL (ref 0.1–0.9)
MONO%: 11.8 % (ref 0.0–13.0)
NEUT#: 3.1 10*3/uL (ref 1.5–6.5)
NEUT%: 40.4 % (ref 39.6–80.0)
PLATELETS: 287 10*3/uL (ref 145–400)
RBC: 3.95 10*6/uL (ref 3.70–5.32)
RDW: 15.8 % — AB (ref 11.1–15.7)
WBC: 7.6 10*3/uL (ref 3.9–10.0)

## 2016-02-12 LAB — IRON AND TIBC
%SAT: 59 % — ABNORMAL HIGH (ref 21–57)
IRON: 78 ug/dL (ref 41–142)
TIBC: 131 ug/dL — AB (ref 236–444)
UIBC: 53 ug/dL — ABNORMAL LOW (ref 120–384)

## 2016-02-12 LAB — FERRITIN: FERRITIN: 528 ng/mL — AB (ref 9–269)

## 2016-02-12 MED ORDER — PB-HYOSCY-ATROPINE-SCOPOLAMINE 16.2 MG PO TABS: 1.0000 | ORAL_TABLET | Freq: Three times a day (TID) | ORAL | Status: DC | PRN

## 2016-02-12 NOTE — Progress Notes (Signed)
Hematology and Oncology Follow Up Visit  [name] [id number] Dec 07, 1950 65 y.o. 02/12/2016   Principle Diagnosis:   Stage IB (T1, N1, M0) adenocarcinoma of the stomach Non-insulin-dependent diabetes   Current Therapy:   Observation     Interim History:  Ms.  [name] is having a little bit of a tough time. She was hospitalized recently. This is for abdominal pain. She had a thorough workup. She had a lot of studies done. I think she even underwent endoscopy. She had scans done. Everything came back pretty much normal.   She has lost 20 pounds since her last saw her. She just does not eat much.   I will know she has gastric emptying issues. I don't know if she has reflux. I think she sees the gastroenterologist next month.   She really hates being on all these medicines. She actually passed out once. Her blood pressure was on the low side. Her medicines have been adjusted.   Again, thankfully, is no obvious issues with her having recurrent cancer. I would think that after 8 years, this would not be a problem. Then again, she may have issues with respect to scar tissue from past surgery and radiation.   Overall, performance status is ECOG 2   Medications:  Current outpatient prescriptions:  .  aspirin 81 MG tablet, Take 81 mg by mouth at bedtime., Disp: , Rfl:  .  [place] alk-PHENObarbital (DONNATAL) 16.2 MG tablet, Take 1 tablet by mouth every 8 (eight) hours as needed., Disp: 60 tablet, Rfl: 1 .  carvedilol (COREG) 6.25 MG tablet, Take 1 tablet (6.25 mg total) by mouth 2 (two) times daily with a meal., Disp: 180 tablet, Rfl: 1 .  DULoxetine (CYMBALTA) 60 MG capsule, Take 1 capsule (60 mg total) by mouth 2 (two) times daily., Disp: 180 capsule, Rfl: 3 .  EMBEDA 30-1.2 MG CPCR, Take 1 tablet by mouth at bedtime, Disp: , Rfl: 0 .  glimepiride (AMARYL) 4 MG tablet, Take 1 tablet (4 mg total) by mouth 2 (two) times daily., Disp: 60 tablet, Rfl: 0 .  hyoscyamine (LEVSIN SL) 0.125 MG SL  tablet, Place 1 tablet (0.125 mg total) under the tongue every 6 (six) hours as needed (abdominal pain)., Disp: 30 tablet, Rfl: 0 .  lidocaine-prilocaine (EMLA) cream, Apply 1 application topically as needed., Disp: 30 g, Rfl: 5 .  LORazepam (ATIVAN) 0.5 MG tablet, Take 1 tablet (0.5 mg total) by mouth at bedtime as needed for anxiety., Disp: 30 tablet, Rfl: 0 .  metaxalone (SKELAXIN) 800 MG tablet, Take 1 tablet (800 mg total) by mouth 4 (four) times daily. (Patient taking differently: Take 800 mg by mouth 4 (four) times daily as needed for muscle spasms. ), Disp: 30 tablet, Rfl: 0 .  metFORMIN (GLUCOPHAGE) 850 MG tablet, Take 1 tablet (850 mg total) by mouth 2 (two) times daily. Repeat labs are due now, Disp: 180 tablet, Rfl: 1 .  metoCLOPramide (REGLAN) 5 MG tablet, Take 1 tablet (5 mg total) by mouth every 8 (eight) hours as needed for nausea or vomiting., Disp: 30 tablet, Rfl: 0 .  morphine (MSIR) 15 MG tablet, Take 15 mg three times daily as needed for pain, Disp: , Rfl: 0 .  Multiple Vitamin (MULTIVITAMIN WITH MINERALS) TABS tablet, Take 1 tablet by mouth daily., Disp: , Rfl:  .  ondansetron (ZOFRAN ODT) 8 MG disintegrating tablet, Take 1 tablet (8 mg total) by mouth every 8 (eight) hours as needed for nausea or vomiting., Disp:  20 tablet, Rfl: 0 .  pantoprazole (PROTONIX) 40 MG tablet, Take 1 tablet (40 mg total) by mouth daily., Disp: 30 tablet, Rfl: 0  Allergies:  Allergies  Allergen Reactions  . Adhesive [Tape] Other (See Comments)    Burn Skin  . Codeine Other (See Comments)    paranoid  . Zocor [Simvastatin - High Dose] Other (See Comments)    Muscle spasms  . Penicillins Rash    Has patient had a PCN reaction causing immediate rash, facial/tongue/throat swelling, SOB or lightheadedness with hypotension: Yes Has patient had a PCN reaction causing severe rash involving mucus membranes or skin necrosis: No Has patient had a PCN reaction that required hospitalization does not  remember.  Has patient had a PCN reaction occurring within the last 10 years: No If all of the above answers are "NO", then may proceed with Cephalosporin use.     Past Medical History, Surgical history, Social history, and Family History were reviewed and updated.  Review of Systems: As above  Physical Exam:  weight is 230 lb 12.8 oz (104.69 kg). Her oral temperature is 97.5 F (36.4 C). Her blood pressure is 97/62 and her pulse is 102. Her respiration is 20 and oxygen saturation is 97%.   Obese African American female. Head and neck exam shows no ocular or oral lesions. She has no palpable cervical or supraclavicular lymph nodes. Lungs are clear. Cardiac exam regular rate and rhythm with no murmurs, rubs or bruits. Abdomen is soft. She is moderately obese. She has a well-healed laparotomy scar. There is no fluid wave. There is no palpable liver or spleen tip. Back exam shows no tenderness over the spine ribs or hips. Extremities shows no clubbing cyanosis or edema. Skin exam no rashes. Neurological exam is nonfocal.  Lab Results  Component Value Date   WBC 7.6 02/12/2016   HGB 12.1 02/12/2016   HCT 34.8 02/12/2016   MCV 88 02/12/2016   PLT 287 02/12/2016     Chemistry      Component Value Date/Time   NA 141 02/04/2016 0513   NA 141 01/13/2015 1030   NA 146* 09/25/2014 0933   K 3.8 02/04/2016 0513   K 4.9 01/13/2015 1030   K 5.1* 09/25/2014 0933   CL 110 02/04/2016 0513   CL 102 09/25/2014 0933   CO2 25 02/04/2016 0513   CO2 28 01/13/2015 1030   CO2 30 09/25/2014 0933   BUN 10 02/04/2016 0513   BUN 18.7 01/13/2015 1030   BUN 15 09/25/2014 0933   CREATININE 1.63* 02/04/2016 0513   CREATININE 1.57* 01/25/2016 1621   CREATININE 1.2* 01/13/2015 1030      Component Value Date/Time   CALCIUM 7.8* 02/04/2016 0513   CALCIUM 9.7 01/13/2015 1030   CALCIUM 9.5 09/25/2014 0933   ALKPHOS 65 02/04/2016 0513   ALKPHOS 70 01/13/2015 1030   ALKPHOS 82 09/25/2014 0933   AST 56*  02/04/2016 0513   AST 16 01/13/2015 1030   AST 20 09/25/2014 0933   ALT 37 02/04/2016 0513   ALT 17 01/13/2015 1030   ALT 16 09/25/2014 0933   BILITOT 0.7 02/04/2016 0513   BILITOT 0.35 01/13/2015 1030   BILITOT 0.50 09/25/2014 0933     .   Impression and Plan: [name] is 65 year old African [place] female with stage IB stomach cancer. She underwent resection. She then had adjuvant radiation and chemotherapy. She completed this about 8 years ago.  I'm not sure as was going on with her  abdominal issues. She was hospitalized recently. She had a full workup. She had endoscopy. She had a hepatobiliary scan. She had an MRI of the liver recently. She had CAT scans. Everything turned up pretty much negative.  I do not know if she has irritable bowel syndrome. I spoke she might. I suppose she may have scar tissue from where she had surgery and radiation several years ago. No way to know this would be to do a laparotomy on her. I still think this would ever happen.  I'll try some Donnatal on her. We'll see if this helps.Volanda Napoleon, MD 7/21/201710:43 AM

## 2016-02-12 NOTE — Patient Instructions (Signed)

## 2016-02-14 ENCOUNTER — Encounter (HOSPITAL_BASED_OUTPATIENT_CLINIC_OR_DEPARTMENT_OTHER): Payer: Self-pay | Admitting: *Deleted

## 2016-02-14 ENCOUNTER — Other Ambulatory Visit: Payer: Self-pay

## 2016-02-14 ENCOUNTER — Emergency Department (HOSPITAL_BASED_OUTPATIENT_CLINIC_OR_DEPARTMENT_OTHER)
Admission: EM | Admit: 2016-02-14 | Discharge: 2016-02-14 | Disposition: A | Discharge status: Home / Self Care | Payer: Medicare HMO | Attending: Emergency Medicine | Admitting: Emergency Medicine

## 2016-02-14 DIAGNOSIS — Z7984 Long term (current) use of oral hypoglycemic drugs: Secondary | ICD-10-CM | POA: Diagnosis not present

## 2016-02-14 DIAGNOSIS — I129 Hypertensive chronic kidney disease with stage 1 through stage 4 chronic kidney disease, or unspecified chronic kidney disease: Secondary | ICD-10-CM | POA: Diagnosis not present

## 2016-02-14 DIAGNOSIS — F329 Major depressive disorder, single episode, unspecified: Secondary | ICD-10-CM | POA: Insufficient documentation

## 2016-02-14 DIAGNOSIS — E1122 Type 2 diabetes mellitus with diabetic chronic kidney disease: Secondary | ICD-10-CM | POA: Diagnosis not present

## 2016-02-14 DIAGNOSIS — Z7982 Long term (current) use of aspirin: Secondary | ICD-10-CM | POA: Diagnosis not present

## 2016-02-14 DIAGNOSIS — Z8673 Personal history of transient ischemic attack (TIA), and cerebral infarction without residual deficits: Secondary | ICD-10-CM | POA: Diagnosis not present

## 2016-02-14 DIAGNOSIS — E785 Hyperlipidemia, unspecified: Secondary | ICD-10-CM | POA: Insufficient documentation

## 2016-02-14 DIAGNOSIS — N182 Chronic kidney disease, stage 2 (mild): Secondary | ICD-10-CM | POA: Diagnosis not present

## 2016-02-14 DIAGNOSIS — Z79899 Other long term (current) drug therapy: Secondary | ICD-10-CM | POA: Insufficient documentation

## 2016-02-14 DIAGNOSIS — J45909 Unspecified asthma, uncomplicated: Secondary | ICD-10-CM | POA: Diagnosis not present

## 2016-02-14 DIAGNOSIS — R1084 Generalized abdominal pain: Secondary | ICD-10-CM | POA: Diagnosis not present

## 2016-02-14 DIAGNOSIS — E1140 Type 2 diabetes mellitus with diabetic neuropathy, unspecified: Secondary | ICD-10-CM | POA: Insufficient documentation

## 2016-02-14 DIAGNOSIS — Z87891 Personal history of nicotine dependence: Secondary | ICD-10-CM | POA: Diagnosis not present

## 2016-02-14 DIAGNOSIS — R1310 Dysphagia, unspecified: Secondary | ICD-10-CM | POA: Diagnosis present

## 2016-02-14 LAB — CBC WITH DIFFERENTIAL/PLATELET
BASOS ABS: 0 10*3/uL (ref 0.0–0.1)
BASOS PCT: 0 %
Eosinophils Absolute: 0.4 10*3/uL (ref 0.0–0.7)
Eosinophils Relative: 4 %
HEMATOCRIT: 34.1 % — AB (ref 36.0–46.0)
HEMOGLOBIN: 11.9 g/dL — AB (ref 12.0–15.0)
Lymphocytes Relative: 21 %
Lymphs Abs: 1.8 10*3/uL (ref 0.7–4.0)
MCH: 30.1 pg (ref 26.0–34.0)
MCHC: 34.9 g/dL (ref 30.0–36.0)
MCV: 86.1 fL (ref 78.0–100.0)
Monocytes Absolute: 0.9 10*3/uL (ref 0.1–1.0)
Monocytes Relative: 11 %
NEUTROS ABS: 5.4 10*3/uL (ref 1.7–7.7)
NEUTROS PCT: 64 %
Platelets: 271 10*3/uL (ref 150–400)
RBC: 3.96 MIL/uL (ref 3.87–5.11)
RDW: 15.6 % — ABNORMAL HIGH (ref 11.5–15.5)
WBC: 8.5 10*3/uL (ref 4.0–10.5)

## 2016-02-14 LAB — BASIC METABOLIC PANEL
ANION GAP: 8 (ref 5–15)
BUN: 18 mg/dL (ref 6–20)
CHLORIDE: 102 mmol/L (ref 101–111)
CO2: 30 mmol/L (ref 22–32)
Calcium: 8.2 mg/dL — ABNORMAL LOW (ref 8.9–10.3)
Creatinine, Ser: 1.32 mg/dL — ABNORMAL HIGH (ref 0.44–1.00)
GFR calc non Af Amer: 42 mL/min — ABNORMAL LOW (ref >60)
GFR, EST AFRICAN AMERICAN: 49 mL/min — AB (ref >60)
Glucose, Bld: 110 mg/dL — ABNORMAL HIGH (ref 65–99)
POTASSIUM: 4.6 mmol/L (ref 3.5–5.1)
Sodium: 140 mmol/L (ref 135–145)

## 2016-02-14 MED ORDER — SUCRALFATE 1 G PO TABS
1.0000 g | ORAL_TABLET | Freq: Once | ORAL | Status: AC
  Administered 2016-02-14: 1 g via ORAL
  Filled 2016-02-14: qty 1

## 2016-02-14 MED ORDER — PB-HYOSCY-ATROPINE-SCOPOLAMINE 16.2 MG PO TABS
1.0000 | ORAL_TABLET | Freq: Four times a day (QID) | ORAL | Status: DC | PRN
  Filled 2016-02-14: qty 1

## 2016-02-14 MED ORDER — RANITIDINE HCL 150 MG/10ML PO SYRP
150.0000 mg | ORAL_SOLUTION | Freq: Once | ORAL | Status: AC
  Administered 2016-02-14: 150 mg via ORAL
  Filled 2016-02-14: qty 10

## 2016-02-14 MED ORDER — METOCLOPRAMIDE HCL 10 MG PO TABS
5.0000 mg | ORAL_TABLET | Freq: Once | ORAL | Status: AC
  Administered 2016-02-14: 5 mg via ORAL
  Filled 2016-02-14: qty 1

## 2016-02-14 MED ORDER — SODIUM CHLORIDE 0.9 % IV BOLUS (SEPSIS)
1000.0000 mL | Freq: Once | INTRAVENOUS | Status: AC
  Administered 2016-02-14: 1000 mL via INTRAVENOUS

## 2016-02-14 MED ORDER — GI COCKTAIL ~~LOC~~
30.0000 mL | Freq: Once | ORAL | Status: AC
  Administered 2016-02-14: 30 mL via ORAL
  Filled 2016-02-14: qty 30

## 2016-02-14 NOTE — ED Triage Notes (Signed)
Patient states she has a several week history of abdominal pain due to her hiatal hernia.  States she has been seen at [facility], [facility] and by Dr. Marin Olp two days ago.  States she was prescribed a new medication by Dr. Marin Olp two days ago and can not get it filled without authorization.  Here today due to the continued pain.

## 2016-02-14 NOTE — ED Notes (Addendum)
Pt reports difficulty swallowing for 1 week.  Saw her oncologist on Friday and was given a RX for Donnatal but couldn't get it filled due to authorization issues at the pharmacy, per pt.  Pt states "i always have abdominal pains" but today pt is concerned with difficulty in swallowing and states she is unable to get food or her pills down.  "they get stuck halfway and come back up."

## 2016-02-14 NOTE — ED Provider Notes (Signed)
[facility]-EMERGENCY DEPT Provider Note   CSN: [id number] Arrival date & time: 02/14/16  1021  First Provider Contact:  None       History   Chief Complaint Chief Complaint  Patient presents with  . Dysphagia    HPI [name] is a 65 y.o. female.  65-year-old female with a history of diabetes, hiatal hernia, hypertension who presents to the emergency department with dysphagia. Patient states that she's been doing this for quite a while but seems to get worse. Her oncologist prescribes medication didn't seem to help. On my evaluation, the patient also states that she has nausea at times. No worsening abdominal pain, back pain, urinary symptoms. This is slightly worse than her normal period other than that she does makes it better or makes it worse. No other associated symptoms.       Past Medical History:  Diagnosis Date  . Asthma   . Degenerative joint disease   . Diabetes mellitus   . GERD (gastroesophageal reflux disease)   . Hyperlipidemia    no longer on medication for this  . Hypertension    actually taking for DM renal protection, not DM  . Neuropathy (HCC)    bilateral hands and feet  . Sickle cell trait (HCC)   . Sleep apnea   . stomach ca dx'd 2010   chemo/xrt comp 05/2009    Patient Active Problem List   Diagnosis Date Noted  . Hypotension 02/03/2016  . Syncope 02/03/2016  . Hypomagnesemia 02/03/2016  . AKI (acute kidney injury) (HCC) 02/03/2016  . Gastrointestinal dysmotility 01/20/2016  . CKD stage 2 due to type 2 diabetes mellitus (HCC) 01/20/2016  . Abscess of right thigh 01/20/2016  . Chronic pain syndrome 01/15/2016  . Diarrhea 01/15/2016  . Generalized abdominal pain 01/14/2016  . Uncontrolled type 2 diabetes mellitus with diabetic polyneuropathy, without long-term current use of insulin (HCC) 12/30/2015  . Morbid obesity due to excess calories (HCC) 05/05/2015  . Diabetic polyneuropathy associated with type 2 diabetes mellitus (HCC) 01/02/2015    . Acute bacterial sinusitis 01/02/2015  . Onychomycosis 11/04/2014  . Pain in lower limb 11/04/2014  . Multinodular goiter (nontoxic) 10/16/2014  . CVA (cerebral infarction) 08/11/2014  . Iron deficiency anemia 05/20/2014  . Major depressive disorder, single episode, moderate (HCC) 05/20/2014  . Dizziness of unknown cause 04/28/2014  . MDD (major depressive disorder), recurrent episode, moderate (HCC) 01/28/2014  . GERD (gastroesophageal reflux disease) 01/02/2014  . Nausea 10/15/2012  . Stomach cancer (HCC) 12/10/2008  . CHRONIC RESPIRATORY FAILURE 01/03/2008  . Hyperlipidemia 06/27/2007  . OBESITY, MORBID 06/27/2007  . SLEEP APNEA, OBSTRUCTIVE 06/27/2007  . Essential hypertension 06/27/2007  . Asthma 06/27/2007  . OSTEOARTHRITIS  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 06/27/2007    Past Surgical History:  Procedure Laterality Date  . APPENDECTOMY    . CESAREAN SECTION    . COLON SURGERY    . ESOPHAGOGASTRODUODENOSCOPY N/A 10/15/2012   Procedure: ESOPHAGOGASTRODUODENOSCOPY (EGD);  Surgeon: Vincent C. Schooler, MD;  Location: [facility];  Service: Endoscopy;  Laterality: N/A;  . ESOPHAGOGASTRODUODENOSCOPY N/A 01/15/2016   Procedure: ESOPHAGOGASTRODUODENOSCOPY (EGD);  Surgeon: Robert Buccini, MD;  Location: [facility];  Service: Endoscopy;  Laterality: N/A;  . HERNIA REPAIR    . SHOULDER SURGERY     Left  . TOTAL HIP ARTHROPLASTY     Right  . TOTAL KNEE ARTHROPLASTY     Left    OB History    Gravida Para Term Preterm AB Living     4 4       4   SAB TAB Ectopic Multiple Live Births                   Home Medications    Prior to Admission medications   Medication Sig Start Date End Date Taking? Authorizing Provider  aspirin 81 MG tablet Take 81 mg by mouth at bedtime.    Historical Provider, MD  belladona alk-PHENObarbital (DONNATAL) 16.2 MG tablet Take 1 tablet by mouth every 8 (eight) hours as needed. 02/12/16   Peter R Ennever, MD  carvedilol (COREG) 6.25 MG tablet Take  1 tablet (6.25 mg total) by mouth 2 (two) times daily with a meal. 02/09/16   Yvonne R Lowne Chase, DO  DULoxetine (CYMBALTA) 60 MG capsule Take 1 capsule (60 mg total) by mouth 2 (two) times daily. 06/09/15   Yvonne R Lowne Chase, DO  EMBEDA 30-1.2 MG CPCR Take 1 tablet by mouth at bedtime 12/24/15   Historical Provider, MD  glimepiride (AMARYL) 4 MG tablet Take 1 tablet (4 mg total) by mouth 2 (two) times daily. 02/09/16   Yvonne R Lowne Chase, DO  hyoscyamine (LEVSIN SL) 0.125 MG SL tablet Place 1 tablet (0.125 mg total) under the tongue every 6 (six) hours as needed (abdominal pain). 01/20/16   Nishant Dhungel, MD  lidocaine-prilocaine (EMLA) cream Apply 1 application topically as needed. 06/29/15   Peter R Ennever, MD  LORazepam (ATIVAN) 0.5 MG tablet Take 1 tablet (0.5 mg total) by mouth at bedtime as needed for anxiety. 08/21/15   Yvonne R Lowne Chase, DO  metaxalone (SKELAXIN) 800 MG tablet Take 1 tablet (800 mg total) by mouth 4 (four) times daily. Patient taking differently: Take 800 mg by mouth 4 (four) times daily as needed for muscle spasms.  04/06/15   Yvonne R Lowne Chase, DO  metFORMIN (GLUCOPHAGE) 850 MG tablet Take 1 tablet (850 mg total) by mouth 2 (two) times daily. Repeat labs are due now 12/30/15   Cristina Gherghe, MD  metoCLOPramide (REGLAN) 5 MG tablet Take 1 tablet (5 mg total) by mouth every 8 (eight) hours as needed for nausea or vomiting. 01/25/16   Edward Saguier, PA-C  morphine (MSIR) 15 MG tablet Take 15 mg three times daily as needed for pain 12/18/15   Historical Provider, MD  Multiple Vitamin (MULTIVITAMIN WITH MINERALS) TABS tablet Take 1 tablet by mouth daily.    Historical Provider, MD  ondansetron (ZOFRAN ODT) 8 MG disintegrating tablet Take 1 tablet (8 mg total) by mouth every 8 (eight) hours as needed for nausea or vomiting. 01/25/16   Edward Saguier, PA-C  pantoprazole (PROTONIX) 40 MG tablet Take 1 tablet (40 mg total) by mouth daily. 01/13/16   Stephen Kohut, MD    Family  History Family History  Problem Relation Age of Onset  . Diabetes Brother   . Alzheimer's disease Father 94  . Cancer Mother 74  . Diabetes Maternal Grandmother   . Cancer Maternal Grandfather     lung  . Suicidality Neg Hx   . Depression Neg Hx   . Dementia Neg Hx   . Anxiety disorder Neg Hx     Social History Social History  Substance Use Topics  . Smoking status: Former Smoker    Packs/day: 1.00    Years: 15.00    Types: Cigarettes    Start date: 09/27/1973    Quit date: 10/15/1988  . Smokeless tobacco: Never Used     Comment: quit smoking   14 years ago  . Alcohol use No     Allergies   Adhesive [tape]; Codeine; Zocor [simvastatin - high dose]; and Penicillins   Review of Systems Review of Systems  Eyes: Negative for pain.  Gastrointestinal: Positive for nausea. Negative for abdominal pain and vomiting.  All other systems reviewed and are negative.    Physical Exam Updated Vital Signs BP 108/88 (BP Location: Left Arm)   Pulse 82   Temp 99.9 F (37.7 C) (Oral)   Resp 16   Ht 5' 7" (1.702 m)   Wt 225 lb (102.1 kg)   SpO2 100%   BMI 35.24 kg/m   Physical Exam  Constitutional: She appears well-developed and well-nourished.  HENT:  Head: Normocephalic and atraumatic.  Neck: Normal range of motion.  Cardiovascular: Normal rate, regular rhythm and normal heart sounds.  Exam reveals no gallop and no friction rub.   No murmur heard. Pulmonary/Chest: No stridor. No respiratory distress.  Abdominal: Soft. She exhibits no distension. There is no tenderness.  Neurological: She is alert.  Skin: Skin is warm and dry.  Nursing note and vitals reviewed.    ED Treatments / Results  Labs (all labs ordered are listed, but only abnormal results are displayed) Labs Reviewed  CBC WITH DIFFERENTIAL/PLATELET - Abnormal; Notable for the following:       Result Value   Hemoglobin 11.9 (*)    HCT 34.1 (*)    RDW 15.6 (*)    All other components within normal limits    BASIC METABOLIC PANEL - Abnormal; Notable for the following:    Glucose, Bld 110 (*)    Creatinine, Ser 1.32 (*)    Calcium 8.2 (*)    GFR calc non Af Amer 42 (*)    GFR calc Af Amer 49 (*)    All other components within normal limits    EKG  EKG Interpretation  Date/Time:  Sunday February 14 2016 12:43:21 EDT Ventricular Rate:  70 PR Interval:    QRS Duration: 84 QT Interval:  367 QTC Calculation: 396 R Axis:   31 Text Interpretation:  Sinus rhythm Normal sinus rhythm Confirmed by MACKUEN, COURTNEY ([id number]) on 02/15/2016 12:32:06 PM       Radiology No results found.  Procedures Procedures (including critical care time)  Medications Ordered in ED Medications  sucralfate (CARAFATE) tablet 1 g (1 g Oral Given 02/14/16 1152)  ranitidine (ZANTAC) 150 MG/10ML syrup 150 mg (150 mg Oral Given 02/14/16 1152)  metoCLOPramide (REGLAN) tablet 5 mg (5 mg Oral Given 02/14/16 1153)  sodium chloride 0.9 % bolus 1,000 mL (0 mLs Intravenous Stopped 02/14/16 1401)  gi cocktail (Maalox,Lidocaine,Donnatal) (30 mLs Oral Given 02/14/16 1234)     Initial Impression / Assessment and Plan / ED Course  I have reviewed the triage vital signs and the nursing notes.  Pertinent labs & imaging results that were available during my care of the patient were reviewed by me and considered in my medical decision making (see chart for details).  Clinical Course    Dysphagia improved in ED. D/w pharmacy and will try bentyl for the time being. Labs ok without AKI or electrolyte abnormality. Plan for continued follow up with oncologist and GI as needed.   Final Clinical Impressions(s) / ED Diagnoses   Final diagnoses:  Generalized abdominal pain    New Prescriptions Discharge Medication List as of 02/14/2016  2:58 PM        , MD 02/15/16 1728  

## 2016-02-15 ENCOUNTER — Other Ambulatory Visit: Payer: Self-pay | Admitting: Nurse Practitioner

## 2016-02-15 ENCOUNTER — Telehealth: Payer: Self-pay | Admitting: Family Medicine

## 2016-02-15 MED ORDER — DICYCLOMINE HCL 20 MG PO TABS: 20.0000 mg | ORAL_TABLET | Freq: Three times a day (TID) | ORAL | 3 refills | Status: DC

## 2016-02-15 NOTE — Telephone Encounter (Signed)
Pt called in to make PCP aware that she was admitted and discharged from the hospital yesterday. Pt would like a call back from [facility] when possible.

## 2016-02-15 NOTE — Telephone Encounter (Signed)
Spoke with patient and she is still having the abdominal pain, she said she went to the ED and was given Reglan. She was given a script for Donnatal but it needs a PA. She has also been having Diarrhea x's 2 days , No blood in the stool, no temp. She did not have anymore scans done but they gave her fluids. Her follow up is tomorrow at 10, she wanted to know if you had any suggestion on abdominal pain, she also has issues swallowing the pills.  Please advise   KP

## 2016-02-15 NOTE — Telephone Encounter (Signed)
She definitely needs to see GI---- hopefully Dr schooler can get her in [place] an try another GI but  I know they all have a waiit

## 2016-02-15 NOTE — Telephone Encounter (Signed)
Patient has been advised to continue her current therapy and keep her apt in the morning. She verbalized understanding and has agreed.    KP

## 2016-02-16 ENCOUNTER — Encounter: Payer: Self-pay | Admitting: Family Medicine

## 2016-02-16 ENCOUNTER — Ambulatory Visit (INDEPENDENT_AMBULATORY_CARE_PROVIDER_SITE_OTHER): Payer: Medicare HMO | Admitting: Family Medicine

## 2016-02-16 VITALS — BP 111/78 | HR 85 | Temp 99.9°F | Wt 227.2 lb

## 2016-02-16 DIAGNOSIS — I10 Essential (primary) hypertension: Secondary | ICD-10-CM

## 2016-02-16 DIAGNOSIS — R1013 Epigastric pain: Secondary | ICD-10-CM

## 2016-02-16 LAB — CBC WITH DIFFERENTIAL/PLATELET
BASOS ABS: 0 10*3/uL (ref 0.0–0.1)
Basophils Relative: 0.1 % (ref 0.0–3.0)
Eosinophils Absolute: 0.4 10*3/uL (ref 0.0–0.7)
Eosinophils Relative: 5.4 % — ABNORMAL HIGH (ref 0.0–5.0)
HCT: 36.9 % (ref 36.0–46.0)
Hemoglobin: 12.2 g/dL (ref 12.0–15.0)
LYMPHS ABS: 2.8 10*3/uL (ref 0.7–4.0)
LYMPHS PCT: 34.3 % (ref 12.0–46.0)
MCHC: 33 g/dL (ref 30.0–36.0)
MCV: 90.2 fl (ref 78.0–100.0)
MONOS PCT: 10 % (ref 3.0–12.0)
Monocytes Absolute: 0.8 10*3/uL (ref 0.1–1.0)
NEUTROS PCT: 50.2 % (ref 43.0–77.0)
Neutro Abs: 4.1 10*3/uL (ref 1.4–7.7)
Platelets: 275 10*3/uL (ref 150.0–400.0)
RBC: 4.09 Mil/uL (ref 3.87–5.11)
RDW: 16.7 % — ABNORMAL HIGH (ref 11.5–15.5)
WBC: 8.1 10*3/uL (ref 4.0–10.5)

## 2016-02-16 LAB — COMPREHENSIVE METABOLIC PANEL
ALBUMIN: 3.1 g/dL — AB (ref 3.5–5.2)
ALK PHOS: 72 U/L (ref 39–117)
ALT: 31 U/L (ref 0–35)
AST: 101 U/L — ABNORMAL HIGH (ref 0–37)
BILIRUBIN TOTAL: 0.5 mg/dL (ref 0.2–1.2)
BUN: 9 mg/dL (ref 6–23)
CALCIUM: 8.3 mg/dL — AB (ref 8.4–10.5)
CO2: 29 mEq/L (ref 19–32)
Chloride: 104 mEq/L (ref 96–112)
Creatinine, Ser: 1.32 mg/dL — ABNORMAL HIGH (ref 0.40–1.20)
GFR: 52.12 mL/min — AB (ref >60.00)
GLUCOSE: 244 mg/dL — AB (ref 70–99)
Potassium: 4.5 mEq/L (ref 3.5–5.1)
Sodium: 140 mEq/L (ref 135–145)
TOTAL PROTEIN: 6.3 g/dL (ref 6.0–8.3)

## 2016-02-16 MED ORDER — CARVEDILOL 3.125 MG PO TABS: 3.1250 mg | ORAL_TABLET | Freq: Two times a day (BID) | ORAL | 3 refills | Status: DC

## 2016-02-16 NOTE — Patient Instructions (Signed)

## 2016-02-16 NOTE — Assessment & Plan Note (Signed)
Running low Dec coreg Pt has f/u with

## 2016-02-16 NOTE — Progress Notes (Signed)
Patient ID: [name], female    DOB: 24-May-1951  Age: 65 y.o. MRN: [id number]    Subjective:  Subjective  HPI [name] presents for hosp f/u.  She was admitted after being seen here --- she fell while on the scale and hit her head.   Er w/u normal.  Pt admitted for abd pain and syncope.   Pt drinks 3-4 20 oz bottles of water at home.  Today she has had 2 so far.  Her appetite is better.   She is still waiting on Dr Joette Catching office to call her.    Review of Systems  Constitutional: Negative for appetite change, diaphoresis, fatigue and unexpected weight change.  Eyes: Negative for pain, redness and visual disturbance.  Respiratory: Negative for cough, chest tightness, shortness of breath and wheezing.   Cardiovascular: Negative for chest pain, palpitations and leg swelling.  Endocrine: Negative for cold intolerance, heat intolerance, polydipsia, polyphagia and polyuria.  Genitourinary: Negative for difficulty urinating, dysuria and frequency.  Neurological: Negative for dizziness, light-headedness, numbness and headaches.    History Past Medical History:  Diagnosis Date  . Asthma   . Degenerative joint disease   . Diabetes mellitus   . GERD (gastroesophageal reflux disease)   . Hyperlipidemia    no longer on medication for this  . Hypertension    actually taking for DM renal protection, not DM  . Neuropathy ([place])    bilateral hands and feet  . Sickle cell trait ([place])   . Sleep apnea   . stomach ca dx'd 2010   chemo/xrt comp 05/2009    She has a past surgical history that includes Total knee arthroplasty; Total hip arthroplasty; Shoulder surgery; Appendectomy; Cesarean section; Hernia repair; Colon surgery; Esophagogastroduodenoscopy (N/A, 10/15/2012); and Esophagogastroduodenoscopy (N/A, 01/15/2016).   Her family history includes Alzheimer's disease (age of onset: 32) in her father; Cancer in her maternal grandfather; Cancer (age of onset: 34) in her mother; Diabetes in  her brother and maternal grandmother.She reports that she quit smoking about 27 years ago. Her smoking use included Cigarettes. She started smoking about 42 years ago. She has a 15.00 pack-year smoking history. She has never used smokeless tobacco. She reports that she does not drink alcohol or use drugs.  Current Outpatient Prescriptions on File Prior to Visit  Medication Sig Dispense Refill  . aspirin 81 MG tablet Take 81 mg by mouth at bedtime.    . dicyclomine (BENTYL) 20 MG tablet Take 1 tablet (20 mg total) by mouth 4 (four) times daily -  before meals and at bedtime. 120 tablet 3  . DULoxetine (CYMBALTA) 60 MG capsule Take 1 capsule (60 mg total) by mouth 2 (two) times daily. 180 capsule 3  . glimepiride (AMARYL) 4 MG tablet Take 1 tablet (4 mg total) by mouth 2 (two) times daily. 60 tablet 0  . hyoscyamine (LEVSIN SL) 0.125 MG SL tablet Place 1 tablet (0.125 mg total) under the tongue every 6 (six) hours as needed (abdominal pain). 30 tablet 0  . lidocaine-prilocaine (EMLA) cream Apply 1 application topically as needed. 30 g 5  . LORazepam (ATIVAN) 0.5 MG tablet Take 1 tablet (0.5 mg total) by mouth at bedtime as needed for anxiety. 30 tablet 0  . metaxalone (SKELAXIN) 800 MG tablet Take 1 tablet (800 mg total) by mouth 4 (four) times daily. (Patient taking differently: Take 800 mg by mouth 4 (four) times daily as needed for muscle spasms. ) 30 tablet 0  . metFORMIN (  GLUCOPHAGE) 850 MG tablet Take 1 tablet (850 mg total) by mouth 2 (two) times daily. Repeat labs are due now 180 tablet 1  . metoCLOPramide (REGLAN) 5 MG tablet Take 1 tablet (5 mg total) by mouth every 8 (eight) hours as needed for nausea or vomiting. 30 tablet 0  . morphine (MSIR) 15 MG tablet Take 15 mg three times daily as needed for pain  0  . Multiple Vitamin (MULTIVITAMIN WITH MINERALS) TABS tablet Take 1 tablet by mouth daily.    . ondansetron (ZOFRAN ODT) 8 MG disintegrating tablet Take 1 tablet (8 mg total) by mouth  every 8 (eight) hours as needed for nausea or vomiting. 20 tablet 0  . pantoprazole (PROTONIX) 40 MG tablet Take 1 tablet (40 mg total) by mouth daily. 30 tablet 0   No current facility-administered medications on file prior to visit.      Objective:  Objective  Physical Exam  Constitutional: She is oriented to person, place, and time. She appears well-developed and well-nourished.  HENT:  Head: Normocephalic and atraumatic.  Eyes: Conjunctivae and EOM are normal.  Neck: Normal range of motion. Neck supple. No JVD present. Carotid bruit is not present. No thyromegaly present.  Cardiovascular: Normal rate, regular rhythm and normal heart sounds.   No murmur heard. Pulmonary/Chest: Effort normal and breath sounds normal. No respiratory distress. She has no wheezes. She has no rales. She exhibits no tenderness.  Musculoskeletal: She exhibits no edema.  Neurological: She is alert and oriented to person, place, and time.  Psychiatric: She has a normal mood and affect. Her behavior is normal. Judgment and thought content normal.  Nursing note and vitals reviewed.  BP 111/78 (BP Location: Right Arm, Patient Position: Sitting, Cuff Size: Large)   Pulse 85   Temp 99.9 F (37.7 C) (Oral)   Wt 227 lb 3.2 oz (103.1 kg)   BMI 35.58 kg/m  Wt Readings from Last 3 Encounters:  02/16/16 227 lb 3.2 oz (103.1 kg)  02/14/16 225 lb (102.1 kg)  02/12/16 230 lb 12.8 oz (104.7 kg)     Lab Results  Component Value Date   WBC 8.1 02/16/2016   HGB 12.2 02/16/2016   HCT 36.9 02/16/2016   PLT 275.0 02/16/2016   GLUCOSE 244 (H) 02/16/2016   CHOL 185 07/07/2015   TRIG 144 07/07/2015   HDL 58 07/07/2015   LDLCALC 98 07/07/2015   ALT 31 02/16/2016   AST 101 (H) 02/16/2016   NA 140 02/16/2016   K 4.5 02/16/2016   CL 104 02/16/2016   CREATININE 1.32 (H) 02/16/2016   BUN 9 02/16/2016   CO2 29 02/16/2016   TSH 4.112 01/15/2016   INR 0.98 11/05/2010   HGBA1C 11.2 (H) 12/30/2015   MICROALBUR 0.3  04/06/2015    No results found.   Assessment & Plan:  Plan  I have discontinued Ms. Valenti's carvedilol and belladona alk-PHENObarbital. I am also having her start on carvedilol. Additionally, I am having her maintain her multivitamin with minerals, aspirin, metaxalone, DULoxetine, lidocaine-prilocaine, LORazepam, morphine, metFORMIN, pantoprazole, hyoscyamine, metoCLOPramide, ondansetron, glimepiride, dicyclomine, and EMBEDA.  Meds ordered this encounter  Medications  . EMBEDA 50-2 MG CPCR    Sig: Take 1 capsule by mouth daily.  . carvedilol (COREG) 3.125 MG tablet    Sig: Take 1 tablet (3.125 mg total) by mouth 2 (two) times daily with a meal.    Dispense:  60 tablet    Refill:  3    Problem List Items Addressed   This Visit      Unprioritized   Essential hypertension - Primary    Running low Dec coreg Pt has f/u with      Relevant Medications   carvedilol (COREG) 3.125 MG tablet    Other Visit Diagnoses    Epigastric pain       Relevant Orders   Comprehensive metabolic panel (Completed)   CBC with Differential/Platelet (Completed)    improved today-- waiting for GI con't bentyl   Follow-up: Return as scheduled.  Yvonne R Lowne Chase, DO     

## 2016-02-17 ENCOUNTER — Telehealth: Payer: Self-pay | Admitting: Family Medicine

## 2016-02-17 NOTE — Telephone Encounter (Signed)
C Relation to QO:HCOB Call back number:[phone] Pharmacy: [facility], [place] - [address] AT [address]  Reason for call:  Patient inquiring about getting a  blood pressure machine. Please advise

## 2016-02-18 MED ORDER — BD ASSURE BPM/MANUAL ARM CUFF MISC: 0 refills | Status: DC

## 2016-02-18 NOTE — Telephone Encounter (Signed)
Patient checked with pharmacy as per pharmacy Rx never received.

## 2016-02-18 NOTE — Telephone Encounter (Signed)
Refaxed   KP 

## 2016-02-18 NOTE — Telephone Encounter (Signed)
Patient can pick one up from the pharmacy.  KP

## 2016-02-18 NOTE — Addendum Note (Signed)
Addended by: Ewing Schlein on: 02/18/2016 05:43 PM   Modules accepted: Orders

## 2016-02-18 NOTE — Telephone Encounter (Signed)
Patient informed. 

## 2016-02-19 ENCOUNTER — Other Ambulatory Visit: Payer: Self-pay | Admitting: Family Medicine

## 2016-02-19 ENCOUNTER — Other Ambulatory Visit: Payer: Self-pay

## 2016-02-19 DIAGNOSIS — R059 Cough, unspecified: Secondary | ICD-10-CM

## 2016-02-19 DIAGNOSIS — IMO0002 Reserved for concepts with insufficient information to code with codable children: Secondary | ICD-10-CM

## 2016-02-19 DIAGNOSIS — E1165 Type 2 diabetes mellitus with hyperglycemia: Principal | ICD-10-CM

## 2016-02-19 DIAGNOSIS — E1151 Type 2 diabetes mellitus with diabetic peripheral angiopathy without gangrene: Secondary | ICD-10-CM

## 2016-02-19 DIAGNOSIS — R05 Cough: Secondary | ICD-10-CM

## 2016-02-19 MED ORDER — ONDANSETRON 8 MG PO TBDP
8.0000 mg | ORAL_TABLET | Freq: Three times a day (TID) | ORAL | 0 refills | Status: DC | PRN
Start: 2016-02-19 — End: 2016-02-25

## 2016-02-19 MED ORDER — GLIMEPIRIDE 4 MG PO TABS: 4.0000 mg | ORAL_TABLET | Freq: Two times a day (BID) | ORAL | 1 refills | Status: DC

## 2016-02-19 NOTE — Telephone Encounter (Signed)
Message from [facility] and they stating that they do not stock the diabetic medication and they the Rx would nee dot be sent to the local pharmacy. Rx faxed.   KP

## 2016-02-22 ENCOUNTER — Telehealth: Payer: Self-pay | Admitting: Family Medicine

## 2016-02-22 ENCOUNTER — Ambulatory Visit (HOSPITAL_BASED_OUTPATIENT_CLINIC_OR_DEPARTMENT_OTHER)
Admission: RE | Admit: 2016-02-22 | Discharge: 2016-02-22 | Disposition: A | Discharge status: Home / Self Care | Payer: Medicare HMO | Source: Ambulatory Visit | Attending: Family Medicine | Admitting: Family Medicine

## 2016-02-22 DIAGNOSIS — R05 Cough: Secondary | ICD-10-CM | POA: Insufficient documentation

## 2016-02-22 DIAGNOSIS — R059 Cough, unspecified: Secondary | ICD-10-CM

## 2016-02-22 NOTE — Telephone Encounter (Signed)
Pt called in to speak back with CMA. Pt says that she called GI but was told that they couldn't see her until 08/29. Pt would like to know if cma or pcp could advise her further.

## 2016-02-22 NOTE — Telephone Encounter (Signed)
She has bentyl qid, protonix qd, reglan and zofran----- she is on max of bentyl We can increase protonix to bid  If pain is worse -- she would need to go to ER or come back in here

## 2016-02-22 NOTE — Telephone Encounter (Signed)
Relation to TK:TCCE Call back number:[phone]   Reason for call:  Patient would like PCP to review all medication patient is currently on due to the fact she states she's getting sick frequently. Patient she will be unavailable after to 2pm due to her having a imaging appointment gave permission to leave a detail message on [phone]

## 2016-02-22 NOTE — Telephone Encounter (Signed)
Patient came in office to have current medications reviewed due to her concerns of feeling like she may be taking too much medication & GI issues. Patient brought in a bag of medications from home. Genevieve Norlander., CMA checked medications against the patient's current list and spoke with PCP to make her aware and advise for further recommendation.  Informed patient of the provider's recommendation. She verbalized understanding, however patient would like to be seen in office for medication follow-up. Appointment scheduled for 02/25/16 at 9:00 AM w/ Dr. Carollee Herter.

## 2016-02-22 NOTE — Telephone Encounter (Signed)
Spoke with patient and she is having abdominal pain and GI will not see her until 8/29. She wants to know what she should do.    KP

## 2016-02-22 NOTE — Telephone Encounter (Signed)
Reviewed medication list with the patient and Edd Arbour, she is still taking the Bentyl 10, carvedilol 6.25 mg, and some other medications that are not listed. Per Dr.Lowne the patient needs to change stop the Carvedilol 6.25 bid, stop fish oil until she see's GI, stop dicyclomine 10 mg,  Start taking the Carvedilol 3.125 bid, Bentyl 20 mg 4 times daily prn, take Regan as needed and to check with pain management for dose on Embeda. She is also taking Horizant ENA ER bid, sennosides 8.6 mg prn, Singulair 10. I will updated her medication list  And she is scheduled for follow up on 02/25/16 with Dr.Lowne.      KP

## 2016-02-25 ENCOUNTER — Encounter: Payer: Self-pay | Admitting: Family Medicine

## 2016-02-25 ENCOUNTER — Ambulatory Visit (INDEPENDENT_AMBULATORY_CARE_PROVIDER_SITE_OTHER): Payer: Medicare HMO | Admitting: Family Medicine

## 2016-02-25 VITALS — BP 92/70 | Temp 98.4°F | Ht 67.0 in | Wt 217.6 lb

## 2016-02-25 DIAGNOSIS — N390 Urinary tract infection, site not specified: Secondary | ICD-10-CM | POA: Diagnosis not present

## 2016-02-25 DIAGNOSIS — R1010 Upper abdominal pain, unspecified: Secondary | ICD-10-CM | POA: Diagnosis not present

## 2016-02-25 DIAGNOSIS — R1110 Vomiting, unspecified: Secondary | ICD-10-CM

## 2016-02-25 DIAGNOSIS — R82998 Other abnormal findings in urine: Secondary | ICD-10-CM

## 2016-02-25 LAB — CBC WITH DIFFERENTIAL/PLATELET
BASOS ABS: 0 10*3/uL (ref 0.0–0.1)
Basophils Relative: 0.4 % (ref 0.0–3.0)
EOS PCT: 5.8 % — AB (ref 0.0–5.0)
Eosinophils Absolute: 0.5 10*3/uL (ref 0.0–0.7)
HCT: 41.4 % (ref 36.0–46.0)
HEMOGLOBIN: 13.6 g/dL (ref 12.0–15.0)
LYMPHS ABS: 2.6 10*3/uL (ref 0.7–4.0)
Lymphocytes Relative: 29.8 % (ref 12.0–46.0)
MCHC: 32.9 g/dL (ref 30.0–36.0)
MCV: 91.3 fl (ref 78.0–100.0)
MONO ABS: 0.6 10*3/uL (ref 0.1–1.0)
Monocytes Relative: 6.8 % (ref 3.0–12.0)
NEUTROS PCT: 57.2 % (ref 43.0–77.0)
Neutro Abs: 5 10*3/uL (ref 1.4–7.7)
Platelets: 292 10*3/uL (ref 150.0–400.0)
RBC: 4.53 Mil/uL (ref 3.87–5.11)
RDW: 15.9 % — ABNORMAL HIGH (ref 11.5–15.5)
WBC: 8.7 10*3/uL (ref 4.0–10.5)

## 2016-02-25 LAB — POCT URINALYSIS DIPSTICK
Blood, UA: NEGATIVE
GLUCOSE UA: NEGATIVE
Ketones, UA: NEGATIVE
PH UA: 5.5
PROTEIN UA: NEGATIVE
UROBILINOGEN UA: 1

## 2016-02-25 LAB — COMPREHENSIVE METABOLIC PANEL
ALBUMIN: 3.3 g/dL — AB (ref 3.5–5.2)
ALK PHOS: 86 U/L (ref 39–117)
ALT: 31 U/L (ref 0–35)
AST: 50 U/L — AB (ref 0–37)
BILIRUBIN TOTAL: 0.8 mg/dL (ref 0.2–1.2)
BUN: 16 mg/dL (ref 6–23)
CO2: 28 mEq/L (ref 19–32)
CREATININE: 1.49 mg/dL — AB (ref 0.40–1.20)
Calcium: 9 mg/dL (ref 8.4–10.5)
Chloride: 104 mEq/L (ref 96–112)
GFR: 45.32 mL/min — ABNORMAL LOW (ref >60.00)
GLUCOSE: 150 mg/dL — AB (ref 70–99)
Potassium: 4.3 mEq/L (ref 3.5–5.1)
SODIUM: 143 meq/L (ref 135–145)
TOTAL PROTEIN: 7.2 g/dL (ref 6.0–8.3)

## 2016-02-25 MED ORDER — PROMETHAZINE HCL 25 MG/ML IJ SOLN
50.0000 mg | Freq: Once | INTRAMUSCULAR | Status: AC
  Administered 2016-02-25: 50 mg via INTRAMUSCULAR

## 2016-02-25 MED ORDER — DICYCLOMINE HCL 20 MG PO TABS: 20.0000 mg | ORAL_TABLET | Freq: Three times a day (TID) | ORAL | Status: DC

## 2016-02-25 MED ORDER — ONDANSETRON 8 MG PO TBDP: 8.0000 mg | ORAL_TABLET | Freq: Three times a day (TID) | ORAL | 0 refills | Status: DC | PRN

## 2016-02-25 MED ORDER — METOCLOPRAMIDE HCL 5 MG PO TABS: 10.0000 mg | ORAL_TABLET | Freq: Three times a day (TID) | ORAL | Status: DC

## 2016-02-25 NOTE — Patient Instructions (Signed)

## 2016-02-25 NOTE — Progress Notes (Signed)
Patient ID: [name], female    DOB: 05/29/1951  Age: 65 y.o. MRN: [id number]    Subjective:  Subjective  HPI [name] presents for f/u abd pain -- nausea and vomiting .  Pt has been in ER mult times with symptoms and has been waiting for GI appointment--- now at the end of the month but she is on a cancellation list.    Review of Systems  Constitutional: Positive for fatigue. Negative for activity change, appetite change and unexpected weight change.  Respiratory: Negative for cough and shortness of breath.   Cardiovascular: Negative for chest pain and palpitations.  Gastrointestinal: Positive for abdominal pain, constipation, nausea and vomiting. Negative for diarrhea.  Psychiatric/Behavioral: Negative for behavioral problems and dysphoric mood. The patient is not nervous/anxious.     History Past Medical History:  Diagnosis Date  . Asthma   . Degenerative joint disease   . Diabetes mellitus   . GERD (gastroesophageal reflux disease)   . Hyperlipidemia    no longer on medication for this  . Hypertension    actually taking for DM renal protection, not DM  . Neuropathy ([place])    bilateral hands and feet  . Sickle cell trait ([place])   . Sleep apnea   . stomach ca dx'd 2010   chemo/xrt comp 05/2009    She has a past surgical history that includes Total knee arthroplasty; Total hip arthroplasty; Shoulder surgery; Appendectomy; Cesarean section; Hernia repair; Colon surgery; Esophagogastroduodenoscopy (N/A, 10/15/2012); and Esophagogastroduodenoscopy (N/A, 01/15/2016).   Her family history includes Alzheimer's disease (age of onset: 84) in her father; Cancer in her maternal grandfather; Cancer (age of onset: 56) in her mother; Diabetes in her brother and maternal grandmother.She reports that she quit smoking about 27 years ago. Her smoking use included Cigarettes. She started smoking about 42 years ago. She has a 15.00 pack-year smoking history. She has never used smokeless  tobacco. She reports that she does not drink alcohol or use drugs.  Current Outpatient Prescriptions on File Prior to Visit  Medication Sig Dispense Refill  . aspirin 81 MG tablet Take 81 mg by mouth at bedtime.    . Blood Pressure Monitoring (B-D ASSURE BPM/MANUAL ARM CUFF) MISC Check blood pressure daily. 1 each 0  . carvedilol (COREG) 3.125 MG tablet Take 1 tablet (3.125 mg total) by mouth 2 (two) times daily with a meal. 60 tablet 3  . dicyclomine (BENTYL) 20 MG tablet Take 1 tablet (20 mg total) by mouth 4 (four) times daily -  before meals and at bedtime. 120 tablet 3  . DULoxetine (CYMBALTA) 60 MG capsule Take 1 capsule (60 mg total) by mouth 2 (two) times daily. 180 capsule 3  . EMBEDA 50-2 MG CPCR Take 1 capsule by mouth daily.    Marland Kitchen glimepiride (AMARYL) 4 MG tablet Take 1 tablet (4 mg total) by mouth 2 (two) times daily. 180 tablet 1  . lidocaine-prilocaine (EMLA) cream Apply 1 application topically as needed. 30 g 5  . LORazepam (ATIVAN) 0.5 MG tablet Take 1 tablet (0.5 mg total) by mouth at bedtime as needed for anxiety. 30 tablet 0  . metaxalone (SKELAXIN) 800 MG tablet Take 1 tablet (800 mg total) by mouth 4 (four) times daily. (Patient taking differently: Take 800 mg by mouth 4 (four) times daily as needed for muscle spasms. ) 30 tablet 0  . metFORMIN (GLUCOPHAGE) 850 MG tablet Take 1 tablet (850 mg total) by mouth 2 (two) times daily. Repeat labs  are due now 180 tablet 1  . morphine (MSIR) 15 MG tablet Take 15 mg three times daily as needed for pain  0  . Multiple Vitamin (MULTIVITAMIN WITH MINERALS) TABS tablet Take 1 tablet by mouth daily.    . pantoprazole (PROTONIX) 40 MG tablet Take 1 tablet (40 mg total) by mouth daily. 30 tablet 0   No current facility-administered medications on file prior to visit.      Objective:  Objective  Physical Exam  Constitutional: She is oriented to person, place, and time. She appears well-developed and well-nourished.  HENT:  Head:  Normocephalic and atraumatic.  Eyes: Conjunctivae and EOM are normal.  Neck: Normal range of motion. Neck supple. No JVD present. Carotid bruit is not present. No thyromegaly present.  Cardiovascular: Normal rate, regular rhythm and normal heart sounds.   No murmur heard. Pulmonary/Chest: Effort normal and breath sounds normal. No respiratory distress. She has no wheezes. She has no rales. She exhibits no tenderness.  Musculoskeletal: She exhibits no edema.  Neurological: She is alert and oriented to person, place, and time.  Psychiatric: She has a normal mood and affect. Her behavior is normal.   BP 92/70   Temp 98.4 F (36.9 C) (Oral)   Ht 5' 7"  (1.702 m)   Wt 217 lb 9.6 oz (98.7 kg)   BMI 34.08 kg/m  Wt Readings from Last 3 Encounters:  02/25/16 217 lb 9.6 oz (98.7 kg)  02/16/16 227 lb 3.2 oz (103.1 kg)  02/14/16 225 lb (102.1 kg)     Lab Results  Component Value Date   WBC 8.7 02/25/2016   HGB 13.6 02/25/2016   HCT 41.4 02/25/2016   PLT 292.0 02/25/2016   GLUCOSE 150 (H) 02/25/2016   CHOL 185 07/07/2015   TRIG 144 07/07/2015   HDL 58 07/07/2015   LDLCALC 98 07/07/2015   ALT 31 02/25/2016   AST 50 (H) 02/25/2016   NA 143 02/25/2016   K 4.3 02/25/2016   CL 104 02/25/2016   CREATININE 1.49 (H) 02/25/2016   BUN 16 02/25/2016   CO2 28 02/25/2016   TSH 4.112 01/15/2016   INR 0.98 11/05/2010   HGBA1C 11.2 (H) 12/30/2015   MICROALBUR 0.3 04/06/2015    Dg Chest 2 View  Result Date: 02/22/2016 CLINICAL DATA:  Cough and left-sided shoulder pain for the past month, former smoker. EXAM: CHEST  2 VIEW COMPARISON:  PA and lateral chest x-ray of February 03, 2016 FINDINGS: The lungs are adequately inflated. There is no focal infiltrate. There is no pleural effusion. The heart and pulmonary vascularity are normal. The mediastinum is normal in width. The power port appliance catheter tip projects over the junction of the proximal and midportions of the SVC. The bony thorax exhibits  no acute abnormality. IMPRESSION: There is no active cardiopulmonary disease. Electronically Signed   By: David  Martinique M.D.   On: 02/22/2016 14:35     Assessment & Plan:  Plan  I have discontinued [name]'s hyoscyamine, metoCLOPramide, and ondansetron. I am also having her start on ondansetron. Additionally, I am having her maintain her multivitamin with minerals, aspirin, metaxalone, DULoxetine, lidocaine-prilocaine, LORazepam, morphine, metFORMIN, pantoprazole, dicyclomine, EMBEDA, carvedilol, B-D ASSURE BPM/MANUAL ARM CUFF, and glimepiride. We administered promethazine. We will continue to administer metoCLOPramide and dicyclomine.  Meds ordered this encounter  Medications  . ondansetron (ZOFRAN-ODT) 8 MG disintegrating tablet    Sig: Take 1 tablet (8 mg total) by mouth every 8 (eight) hours as needed for nausea or  vomiting.    Dispense:  20 tablet    Refill:  0  . metoCLOPramide (REGLAN) tablet 10 mg  . dicyclomine (BENTYL) tablet 20 mg  . promethazine (PHENERGAN) injection 50 mg    Problem List Items Addressed This Visit    None    Visit Diagnoses    Intractable vomiting with nausea, vomiting of unspecified type    -  Primary   Relevant Medications   ondansetron (ZOFRAN-ODT) 8 MG disintegrating tablet   metoCLOPramide (REGLAN) tablet 10 mg   dicyclomine (BENTYL) tablet 20 mg   promethazine (PHENERGAN) injection 50 mg (Completed)   Other Relevant Orders   POCT urinalysis dipstick (Completed)   CBC with Differential/Platelet (Completed)   Comprehensive metabolic panel (Completed)   Pain of upper abdomen       Relevant Medications   ondansetron (ZOFRAN-ODT) 8 MG disintegrating tablet   metoCLOPramide (REGLAN) tablet 10 mg   dicyclomine (BENTYL) tablet 20 mg   Other Relevant Orders   POCT urinalysis dipstick (Completed)   CBC with Differential/Platelet (Completed)   Comprehensive metabolic panel (Completed)   Leukocytes in urine       Relevant Orders   Urine culture      gi pending meds adjusted Check labs-- consider IVF F/u with pt in am Follow-up: Return in about 3 months (around 05/27/2016), or if symptoms worsen or fail to improve.  Ann Held, DO

## 2016-02-25 NOTE — Progress Notes (Signed)
Pre visit review using our clinic review tool, if applicable. No additional management support is needed unless otherwise documented below in the visit note. 

## 2016-02-26 LAB — URINE CULTURE: ORGANISM ID, BACTERIA: NO GROWTH

## 2016-02-27 ENCOUNTER — Emergency Department (HOSPITAL_BASED_OUTPATIENT_CLINIC_OR_DEPARTMENT_OTHER): Payer: Medicare HMO

## 2016-02-27 ENCOUNTER — Emergency Department (HOSPITAL_BASED_OUTPATIENT_CLINIC_OR_DEPARTMENT_OTHER)
Admission: EM | Admit: 2016-02-27 | Discharge: 2016-02-27 | Disposition: A | Discharge status: Home / Self Care | Payer: Medicare HMO | Attending: Emergency Medicine | Admitting: Emergency Medicine

## 2016-02-27 ENCOUNTER — Encounter (HOSPITAL_BASED_OUTPATIENT_CLINIC_OR_DEPARTMENT_OTHER): Payer: Self-pay

## 2016-02-27 DIAGNOSIS — I10 Essential (primary) hypertension: Secondary | ICD-10-CM | POA: Diagnosis not present

## 2016-02-27 DIAGNOSIS — R1084 Generalized abdominal pain: Secondary | ICD-10-CM | POA: Diagnosis not present

## 2016-02-27 DIAGNOSIS — E119 Type 2 diabetes mellitus without complications: Secondary | ICD-10-CM | POA: Insufficient documentation

## 2016-02-27 DIAGNOSIS — Z79899 Other long term (current) drug therapy: Secondary | ICD-10-CM | POA: Diagnosis not present

## 2016-02-27 DIAGNOSIS — Z7984 Long term (current) use of oral hypoglycemic drugs: Secondary | ICD-10-CM | POA: Diagnosis not present

## 2016-02-27 DIAGNOSIS — R6883 Chills (without fever): Secondary | ICD-10-CM | POA: Insufficient documentation

## 2016-02-27 DIAGNOSIS — Z7982 Long term (current) use of aspirin: Secondary | ICD-10-CM | POA: Diagnosis not present

## 2016-02-27 DIAGNOSIS — R110 Nausea: Secondary | ICD-10-CM | POA: Diagnosis not present

## 2016-02-27 DIAGNOSIS — Z87891 Personal history of nicotine dependence: Secondary | ICD-10-CM | POA: Insufficient documentation

## 2016-02-27 DIAGNOSIS — J45909 Unspecified asthma, uncomplicated: Secondary | ICD-10-CM | POA: Diagnosis not present

## 2016-02-27 DIAGNOSIS — R109 Unspecified abdominal pain: Secondary | ICD-10-CM | POA: Diagnosis present

## 2016-02-27 LAB — CBC
HEMATOCRIT: 39.1 % (ref 36.0–46.0)
Hemoglobin: 14 g/dL (ref 12.0–15.0)
MCH: 30.2 pg (ref 26.0–34.0)
MCHC: 35.8 g/dL (ref 30.0–36.0)
MCV: 84.3 fL (ref 78.0–100.0)
PLATELETS: 310 10*3/uL (ref 150–400)
RBC: 4.64 MIL/uL (ref 3.87–5.11)
RDW: 15.5 % (ref 11.5–15.5)
WBC: 8.2 10*3/uL (ref 4.0–10.5)

## 2016-02-27 LAB — URINALYSIS, ROUTINE W REFLEX MICROSCOPIC
GLUCOSE, UA: NEGATIVE mg/dL
HGB URINE DIPSTICK: NEGATIVE
Nitrite: NEGATIVE
PH: 6 (ref 5.0–8.0)
Protein, ur: 30 mg/dL — AB
Specific Gravity, Urine: 1.017 (ref 1.005–1.030)

## 2016-02-27 LAB — OCCULT BLOOD X 1 CARD TO LAB, STOOL: FECAL OCCULT BLD: NEGATIVE

## 2016-02-27 LAB — COMPREHENSIVE METABOLIC PANEL
ALBUMIN: 3 g/dL — AB (ref 3.5–5.0)
ALT: 33 U/L (ref 14–54)
AST: 84 U/L — AB (ref 15–41)
Alkaline Phosphatase: 70 U/L (ref 38–126)
Anion gap: 15 (ref 5–15)
BUN: 9 mg/dL (ref 6–20)
CHLORIDE: 104 mmol/L (ref 101–111)
CO2: 21 mmol/L — AB (ref 22–32)
CREATININE: 1.4 mg/dL — AB (ref 0.44–1.00)
Calcium: 8.9 mg/dL (ref 8.9–10.3)
GFR calc Af Amer: 45 mL/min — ABNORMAL LOW (ref >60)
GFR, EST NON AFRICAN AMERICAN: 39 mL/min — AB (ref >60)
GLUCOSE: 186 mg/dL — AB (ref 65–99)
POTASSIUM: 4.3 mmol/L (ref 3.5–5.1)
Sodium: 140 mmol/L (ref 135–145)
Total Bilirubin: 1.2 mg/dL (ref 0.3–1.2)
Total Protein: 7 g/dL (ref 6.5–8.1)

## 2016-02-27 LAB — URINE MICROSCOPIC-ADD ON: RBC / HPF: NONE SEEN RBC/hpf (ref 0–5)

## 2016-02-27 MED ORDER — PROMETHAZINE HCL 25 MG/ML IJ SOLN
12.5000 mg | Freq: Once | INTRAMUSCULAR | Status: AC
  Administered 2016-02-27: 12.5 mg via INTRAVENOUS
  Filled 2016-02-27: qty 1

## 2016-02-27 MED ORDER — ONDANSETRON 4 MG PO TBDP
4.0000 mg | ORAL_TABLET | Freq: Once | ORAL | Status: AC | PRN
  Administered 2016-02-27: 4 mg via ORAL
  Filled 2016-02-27: qty 1

## 2016-02-27 MED ORDER — HYDROCODONE-ACETAMINOPHEN 5-325 MG PO TABS: 1.0000 | ORAL_TABLET | Freq: Four times a day (QID) | ORAL | 0 refills | Status: DC | PRN

## 2016-02-27 MED ORDER — SODIUM CHLORIDE 0.9 % IV BOLUS (SEPSIS)
1000.0000 mL | Freq: Once | INTRAVENOUS | Status: AC
  Administered 2016-02-27: 1000 mL via INTRAVENOUS

## 2016-02-27 MED ORDER — IOPAMIDOL (ISOVUE-300) INJECTION 61%
80.0000 mL | Freq: Once | INTRAVENOUS | Status: AC | PRN
  Administered 2016-02-27: 80 mL via INTRAVENOUS

## 2016-02-27 MED ORDER — HEPARIN SOD (PORK) LOCK FLUSH 100 UNIT/ML IV SOLN
500.0000 [IU] | Freq: Once | INTRAVENOUS | Status: AC
  Administered 2016-02-27: 500 [IU]
  Filled 2016-02-27: qty 5

## 2016-02-27 MED ORDER — METOCLOPRAMIDE HCL 5 MG/ML IJ SOLN
10.0000 mg | Freq: Once | INTRAMUSCULAR | Status: AC
  Administered 2016-02-27: 10 mg via INTRAVENOUS
  Filled 2016-02-27: qty 2

## 2016-02-27 NOTE — ED Notes (Signed)
RN in room.

## 2016-02-27 NOTE — ED Provider Notes (Signed)
[facility] DEPT [facility] Provider Note   CSN: [id number] Arrival date & time: 02/27/16  1322  First Provider Contact:  First MD Initiated Contact with Patient 02/27/16 1604     By signing my name below, I, Ephriam Jenkins, attest that this documentation has been prepared under the direction and in the presence of [employer].  Electronically Signed: Ephriam Jenkins, ED Scribe. 02/27/16. 4:47 PM.   History   Chief Complaint Chief Complaint  Patient presents with  . Abdominal Pain    HPI HPI Comments: [name] is a 65 y.o. female with a PMHx of kidney injury, generalized abdominal pain, GERD, Stomach cancer, Sickle Cell trait, who presents to the Emergency Department complaining of constant, unchanged, generalized aching abdominal pain with associated nausea that has been persistent for the past three days. Pt reports that she has been dealing with similar pain to her abdomen for the past month; states that her current pain feels similar. Pt also reports constant nausea that has been persistent throughout the day today.  Pt states she was seen by her PCP on 02/25/16 and was given Zofran. Pt has follow up with GI doctor 03/22/16.  Pt also states she was given a new medication following her PCP appointment but has not filled the prescription. Pt denies dysuria.  She reports dark red diarrhea yesterday, but no bloody stools today.   The history is provided by the patient. No language interpreter was used.    Past Medical History:  Diagnosis Date  . Asthma   . Degenerative joint disease   . Diabetes mellitus   . GERD (gastroesophageal reflux disease)   . Hyperlipidemia    no longer on medication for this  . Hypertension    actually taking for DM renal protection, not DM  . Neuropathy ([place])    bilateral hands and feet  . Sickle cell trait ([place])   . Sleep apnea   . stomach ca dx'd 2010   chemo/xrt comp 05/2009    Patient Active Problem List   Diagnosis Date Noted  . Hypotension  02/03/2016  . Syncope 02/03/2016  . Hypomagnesemia 02/03/2016  . AKI (acute kidney injury) ([place]) 02/03/2016  . Gastrointestinal dysmotility 01/20/2016  . CKD stage 2 due to type 2 diabetes mellitus ([place]) 01/20/2016  . Abscess of right thigh 01/20/2016  . Chronic pain syndrome 01/15/2016  . Diarrhea 01/15/2016  . Generalized abdominal pain 01/14/2016  . Uncontrolled type 2 diabetes mellitus with diabetic polyneuropathy, without long-term current use of insulin ([place]) 12/30/2015  . Morbid obesity due to excess calories ([place]) 05/05/2015  . Diabetic polyneuropathy associated with type 2 diabetes mellitus ([place]) 01/02/2015  . Acute bacterial sinusitis 01/02/2015  . Onychomycosis 11/04/2014  . Pain in lower limb 11/04/2014  . Multinodular goiter (nontoxic) 10/16/2014  . CVA (cerebral infarction) 08/11/2014  . Iron deficiency anemia 05/20/2014  . Major depressive disorder, single episode, moderate ([place]) 05/20/2014  . Dizziness of unknown cause 04/28/2014  . MDD (major depressive disorder), recurrent episode, moderate ([place]) 01/28/2014  . GERD (gastroesophageal reflux disease) 01/02/2014  . Nausea 10/15/2012  . Stomach cancer ([place]) 12/10/2008  . CHRONIC RESPIRATORY FAILURE 01/03/2008  . Hyperlipidemia 06/27/2007  . OBESITY, MORBID 06/27/2007  . SLEEP APNEA, OBSTRUCTIVE 06/27/2007  . Essential hypertension 06/27/2007  . Asthma 06/27/2007  . OSTEOARTHRITIS  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 06/27/2007    Past Surgical History:  Procedure Laterality Date  . APPENDECTOMY    . CESAREAN SECTION    .  COLON SURGERY    . ESOPHAGOGASTRODUODENOSCOPY N/A 10/15/2012   Procedure: ESOPHAGOGASTRODUODENOSCOPY (EGD);  Surgeon: Lear Ng, MD;  Location: [facility];  Service: Endoscopy;  Laterality: N/A;  . ESOPHAGOGASTRODUODENOSCOPY N/A 01/15/2016   Procedure: ESOPHAGOGASTRODUODENOSCOPY (EGD);  Surgeon: Ronald Lobo, MD;  Location: [facility];  Service: Endoscopy;  Laterality: N/A;  . HERNIA  REPAIR    . SHOULDER SURGERY     Left  . TOTAL HIP ARTHROPLASTY     Right  . TOTAL KNEE ARTHROPLASTY     Left    OB History    Gravida Para Term Preterm AB Living   4 4       4    SAB TAB Ectopic Multiple Live Births                   Home Medications    Prior to Admission medications   Medication Sig Start Date End Date Taking? Authorizing Provider  aspirin 81 MG tablet Take 81 mg by mouth at bedtime.    Historical Provider, MD  Blood Pressure Monitoring (B-D ASSURE BPM/MANUAL ARM CUFF) MISC Check blood pressure daily. 02/18/16   Rosalita Chessman Chase, DO  carvedilol (COREG) 3.125 MG tablet Take 1 tablet (3.125 mg total) by mouth 2 (two) times daily with a meal. 02/16/16   Rosalita Chessman Chase, DO  dicyclomine (BENTYL) 20 MG tablet Take 1 tablet (20 mg total) by mouth 4 (four) times daily -  before meals and at bedtime. 02/15/16   Volanda Napoleon, MD  DULoxetine (CYMBALTA) 60 MG capsule Take 1 capsule (60 mg total) by mouth 2 (two) times daily. 06/09/15   Yvonne R Lowne Chase, DO  EMBEDA 50-2 MG CPCR Take 1 capsule by mouth daily. 02/11/16   Historical Provider, MD  glimepiride (AMARYL) 4 MG tablet Take 1 tablet (4 mg total) by mouth 2 (two) times daily. 02/19/16   Rosalita Chessman Chase, DO  HYDROcodone-acetaminophen (NORCO/VICODIN) 5-325 MG tablet Take 1 tablet by mouth every 6 (six) hours as needed. 02/27/16   Gloriann Loan, PA-C  lidocaine-prilocaine (EMLA) cream Apply 1 application topically as needed. 06/29/15   Volanda Napoleon, MD  LORazepam (ATIVAN) 0.5 MG tablet Take 1 tablet (0.5 mg total) by mouth at bedtime as needed for anxiety. 08/21/15   Alferd Apa Lowne Chase, DO  metaxalone (SKELAXIN) 800 MG tablet Take 1 tablet (800 mg total) by mouth 4 (four) times daily. Patient taking differently: Take 800 mg by mouth 4 (four) times daily as needed for muscle spasms.  04/06/15   Rosalita Chessman Chase, DO  metFORMIN (GLUCOPHAGE) 850 MG tablet Take 1 tablet (850 mg total) by mouth 2 (two) times  daily. Repeat labs are due now 12/30/15   Philemon Kingdom, MD  morphine (MSIR) 15 MG tablet Take 15 mg three times daily as needed for pain 12/18/15   Historical Provider, MD  Multiple Vitamin (MULTIVITAMIN WITH MINERALS) TABS tablet Take 1 tablet by mouth daily.    Historical Provider, MD  ondansetron (ZOFRAN-ODT) 8 MG disintegrating tablet Take 1 tablet (8 mg total) by mouth every 8 (eight) hours as needed for nausea or vomiting. 02/25/16   Alferd Apa Lowne Chase, DO  pantoprazole (PROTONIX) 40 MG tablet Take 1 tablet (40 mg total) by mouth daily. 01/13/16   Virgel Manifold, MD    Family History Family History  Problem Relation Age of Onset  . Diabetes Brother   . Alzheimer's disease Father 4  . Cancer Mother 70  .  Diabetes Maternal Grandmother   . Cancer Maternal Grandfather     lung  . Suicidality Neg Hx   . Depression Neg Hx   . Dementia Neg Hx   . Anxiety disorder Neg Hx     Social History Social History  Substance Use Topics  . Smoking status: Former Smoker    Packs/day: 1.00    Years: 15.00    Types: Cigarettes    Start date: 09/27/1973    Quit date: 10/15/1988  . Smokeless tobacco: Never Used     Comment: quit smoking 14 years ago  . Alcohol use No     Allergies   Adhesive [tape]; Codeine; Zocor [simvastatin - high dose]; and Penicillins   Review of Systems Review of Systems  Constitutional: Positive for chills.  Gastrointestinal: Positive for abdominal pain (generalized) and nausea.  Genitourinary: Negative for dysuria.  All other systems reviewed and are negative.    Physical Exam Updated Vital Signs BP 113/67   Pulse 82   Temp 98.4 F (36.9 C) (Oral)   Resp 18   Ht 5' 7"  (1.702 m)   Wt 98.4 kg   SpO2 98%   BMI 33.99 kg/m   Physical Exam  Constitutional: She is oriented to person, place, and time. She appears well-developed and well-nourished.  Non-toxic appearance. She does not have a sickly appearance. She does not appear ill.  HENT:  Head:  Normocephalic and atraumatic.  Mouth/Throat: Oropharynx is clear and moist.  Eyes: Conjunctivae are normal. Pupils are equal, round, and reactive to light.  Neck: Normal range of motion. Neck supple.  Cardiovascular: Normal rate and regular rhythm.   Pulmonary/Chest: Effort normal and breath sounds normal. No accessory muscle usage or stridor. No respiratory distress. She has no wheezes. She has no rhonchi. She has no rales.  Abdominal: Soft. Bowel sounds are normal. She exhibits no distension and no mass. There is generalized tenderness. There is guarding. There is no rebound.  Genitourinary:  Genitourinary Comments: No gross blood with DRE. No hemorrhoids, no palpable stool in rectum.  Musculoskeletal: Normal range of motion.  Lymphadenopathy:    She has no cervical adenopathy.  Neurological: She is alert and oriented to person, place, and time.  Speech clear without dysarthria.  Skin: Skin is warm and dry.  Psychiatric: She has a normal mood and affect. Her behavior is normal.     ED Treatments / Results  DIAGNOSTIC STUDIES: Oxygen Saturation is 100% on RA, normal by my interpretation.  COORDINATION OF CARE: 4:25 PM-Will order Zofran. Discussed treatment plan with pt at bedside and pt agreed to plan.   Labs (all labs ordered are listed, but only abnormal results are displayed) Labs Reviewed  COMPREHENSIVE METABOLIC PANEL - Abnormal; Notable for the following:       Result Value   CO2 21 (*)    Glucose, Bld 186 (*)    Creatinine, Ser 1.40 (*)    Albumin 3.0 (*)    AST 84 (*)    GFR calc non Af Amer 39 (*)    GFR calc Af Amer 45 (*)    All other components within normal limits  URINALYSIS, ROUTINE W REFLEX MICROSCOPIC (NOT AT [facility]) - Abnormal; Notable for the following:    Color, Urine AMBER (*)    APPearance CLOUDY (*)    Bilirubin Urine LARGE (*)    Ketones, ur >80 (*)    Protein, ur 30 (*)    Leukocytes, UA MODERATE (*)    All other  components within normal limits    URINE MICROSCOPIC-ADD ON - Abnormal; Notable for the following:    Squamous Epithelial / LPF 6-30 (*)    Bacteria, UA FEW (*)    Casts HYALINE CASTS (*)    All other components within normal limits  URINE CULTURE  CBC  OCCULT BLOOD X 1 CARD TO LAB, STOOL    EKG  EKG Interpretation None       Radiology Ct Abdomen Pelvis W Contrast  Result Date: 02/27/2016 CLINICAL DATA:  Generalized diffuse abdominal pain. Nausea. Symptoms for 3 days. EXAM: CT ABDOMEN AND PELVIS WITH CONTRAST TECHNIQUE: Multidetector CT imaging of the abdomen and pelvis was performed using the standard protocol following bolus administration of intravenous contrast. CONTRAST:  109m ISOVUE-300 IOPAMIDOL (ISOVUE-300) INJECTION 61% COMPARISON:  Most recent CT comparison 01/13/2016 FINDINGS: Lower chest: Chronic scarring medial left lower lobe. No pleural fluid. Liver: Severe steatosis.  No evidence of focal lesion. Hepatobiliary: Gallbladder physiologically distended, no calcified stone. No biliary dilatation. Pancreas: Pancreatic atrophy.  No ductal dilatation or inflammation. Spleen: Normal. Adrenal glands: No nodule. Kidneys: Left upper pole renal scarring with decreased profusion is unchanged from prior. There is no hydronephrosis or perinephric edema. Ureters are decompressed. Stomach/Bowel: Small hiatal hernia. Stomach physiologically distended. Postsurgical change post partial gastrectomy. No obvious gastric mass by CT. Questioning gastrojejunostomy. There are no dilated or thickened small bowel loops. No bowel obstruction, enteric contrast reaches the descending colon. Short segment of colonic wall thickening involving the mid transverse colon with minimal adjacent edema. Diverticulosis of the descending colon without acute diverticulitis. Post appendectomy. Vascular/Lymphatic: No retroperitoneal adenopathy. Abdominal aorta is normal in caliber. Atherosclerosis without aneurysm. Mesenteric vessels are patent. Reproductive:  Uterus and adnexa are normal for age. Lower most pelvis partially obscured by streak artifact from right hip arthroplasty. Bladder: Obscured by streak artifact from right hip arthroplasty. Other: No free air, free fluid, or intra-abdominal fluid collection. Postsurgical change of the ventral upper abdominal wall, stable in appearance from prior. Musculoskeletal: There are no acute or suspicious osseous abnormalities. Stable degenerative change in the lumbar spine with primarily facet arthropathy. Minimal anterolisthesis of L4 on L5 is again seen. Right hip arthroplasty. IMPRESSION: 1. Mild wall thickening adjacent edema of the mid transverse colon. This may be infectious or inflammatory. Neoplastic etiology is not excluded, and would be better assessed with direct visualization with colonoscopy. 2. Otherwise no acute abnormality in the abdomen/pelvis. Stable findings of severe hepatic steatosis, small hiatal hernia, distal colonic diverticulosis, partial gastrectomy and left renal scarring. Electronically Signed   By: MJeb LeveringM.D.   On: 02/27/2016 21:54    Procedures Procedures (including critical care time)  Medications Ordered in ED Medications  ondansetron (ZOFRAN-ODT) disintegrating tablet 4 mg (4 mg Oral Given 02/27/16 1524)  metoCLOPramide (REGLAN) injection 10 mg (10 mg Intravenous Given 02/27/16 1700)  sodium chloride 0.9 % bolus 1,000 mL (0 mLs Intravenous Stopped 02/27/16 1951)  promethazine (PHENERGAN) injection 12.5 mg (12.5 mg Intravenous Given 02/27/16 1843)  sodium chloride 0.9 % bolus 1,000 mL (0 mLs Intravenous Stopped 02/27/16 2237)  iopamidol (ISOVUE-300) 61 % injection 80 mL (80 mLs Intravenous Contrast Given 02/27/16 2124)  heparin lock flush 100 unit/mL (500 Units Intracatheter Given 02/27/16 2259)     Initial Impression / Assessment and Plan / ED Course  I have reviewed the triage vital signs and the nursing notes.  Pertinent labs & imaging results that were available during my  care of the patient were reviewed by me  and considered in my medical decision making (see chart for details).  Clinical Course   Patient presents with 1 month history of intermittent abdominal pain. She is being followed by her primary care and equal GI for this. She has had an extensive workup with multiple CTs, liver MR, nuclear medicine to biliary study, and recent EGD. All of these were largely unremarkable for explanation of her symptoms. She presents today with ongoing pain and nausea and vomiting. She reports she did have some dark red diarrhea yesterday. But her stools have returned to normal today. She saw her primary care physician 2 days ago and was prescribed Zofran, however she has not been able to pick that up yet. On arrival she is tachycardic. She is afebrile and slightly hypotensive. She appears uncomfortable. Abdominal exam with diffuse tenderness. No gross blood on DRE. It's guaiac-negative. She has no urinary symptoms. He appears contaminated. Renal function appears to be baseline. Otherwise, labs without acute abnormalities. CT abdomen pelvis was obtained and showed mild wall thickening adjacent edema of the mid transverse colon. Neoplastic etiology not excluded. Spoke with gastroenterology on-call for [facility] who recommends pain control. No indication for antibiotics at this time. Patient's symptoms improved with IV fluids, Reglan, and Phenergan. Patient takes morphine daily. Will not prescribe narcotics. Per chart review, patient was recently taken off multiple medications. Therefore, will hold from starting new medications. Instructed patient to pick up his Zofran prescription. Patient will call PCP on Monday. Return precautions discussed. Patient agrees and acknowledges the above plan for discharge.  Case has been discussed with and seen by Dr. Lita Mains who agrees with the above plan for discharge.   Final Clinical Impressions(s) / ED Diagnoses   Final diagnoses:  Nausea    Generalized abdominal pain    New Prescriptions Discharge Medication List as of 02/27/2016 11:26 PM    START taking these medications   Details  HYDROcodone-acetaminophen (NORCO/VICODIN) 5-325 MG tablet Take 1 tablet by mouth every 6 (six) hours as needed., Starting Sat 02/27/2016, Print       I personally performed the services described in this documentation, which was scribed in my presence. The recorded information has been reviewed and is accurate.     Gloriann Loan, PA-C 02/27/16 2346    Julianne Rice, MD 03/01/16 2237

## 2016-02-27 NOTE — ED Notes (Signed)
Patient transported to CT 

## 2016-02-27 NOTE — ED Triage Notes (Addendum)
Patient presents to ED with a month history of intermittent abdominal pain. Patient has been to [facility], [facility] and urgent care all for the same and has had no diagnosis for her pain. Patient reports blood in stool last night but no blood today.

## 2016-02-27 NOTE — ED Notes (Signed)
Power port accessed, 20ga, 1 inch Huber needle, sterile techniques used, good blood return, flows to gravity, Site WNL

## 2016-02-29 ENCOUNTER — Telehealth: Payer: Self-pay

## 2016-02-29 LAB — URINE CULTURE

## 2016-02-29 NOTE — Telephone Encounter (Signed)
Patient is scheduled for an ed f/u patient wants to talk to you briefly regarding visit [phone]

## 2016-02-29 NOTE — Telephone Encounter (Signed)
Ok to get home health Please send CT to GI--- [facility] ---see it that will help get her in sooner

## 2016-02-29 NOTE — Telephone Encounter (Signed)
CT reported: 1. Mild wall thickening adjacent edema of the mid transverse colon.This may be infectious or inflammatory. Neoplastic etiology is notexcluded, and would be better assessed with direct visualization with colonoscopy.  Patient wanted you to know the above. She said the hospital did not change anything, she is currently taking the medication as directed and not having any pain at this time but she did have blood in her stool. She said she would like to get a nurse to come out and help with med's because she does not want to overtake anything.   KP

## 2016-03-01 ENCOUNTER — Ambulatory Visit: Payer: Self-pay | Admitting: Internal Medicine

## 2016-03-01 NOTE — Telephone Encounter (Signed)
Dr.Schooler is aware, he was called from the ED. He had not changed her apt.    KP

## 2016-03-01 NOTE — Telephone Encounter (Signed)
Patient has been made aware of the results and recommendations and voiced understanding, she is feeling better and is taking the medication as prescribed and has agreed to follow up with specialist. she also has an ED follow up here on Thursday.   KP

## 2016-03-01 NOTE — Telephone Encounter (Signed)
Take the meds as directed  Dr Marin Olp and dr schooler are aware and pt needs colonoscopy

## 2016-03-03 ENCOUNTER — Telehealth: Payer: Self-pay | Admitting: Family Medicine

## 2016-03-03 ENCOUNTER — Ambulatory Visit: Payer: Self-pay | Admitting: Family Medicine

## 2016-03-03 NOTE — Telephone Encounter (Signed)
Patient called at 10:50 a.m. To say that she will not be at her 1:30 appointment because she does not have a ride. Charge or No Charge?

## 2016-03-03 NOTE — Telephone Encounter (Signed)
Called patient to follow-up per request from PCP. Pt states, "I'm doing a lot better than I was." She reports no new or worsening symptoms, no acute concerns. She was appreciative of the call. Appt rescheduled for 03/04/16.

## 2016-03-03 NOTE — Telephone Encounter (Signed)
No charge but reschedule

## 2016-03-04 ENCOUNTER — Encounter: Payer: Self-pay | Admitting: Family Medicine

## 2016-03-04 ENCOUNTER — Ambulatory Visit (INDEPENDENT_AMBULATORY_CARE_PROVIDER_SITE_OTHER): Payer: Medicare HMO | Admitting: Family Medicine

## 2016-03-04 VITALS — BP 124/78 | HR 67 | Temp 98.1°F | Wt 216.6 lb

## 2016-03-04 DIAGNOSIS — E1151 Type 2 diabetes mellitus with diabetic peripheral angiopathy without gangrene: Secondary | ICD-10-CM | POA: Diagnosis not present

## 2016-03-04 DIAGNOSIS — G43A1 Cyclical vomiting, intractable: Secondary | ICD-10-CM

## 2016-03-04 DIAGNOSIS — H9202 Otalgia, left ear: Secondary | ICD-10-CM

## 2016-03-04 DIAGNOSIS — R1084 Generalized abdominal pain: Secondary | ICD-10-CM | POA: Diagnosis not present

## 2016-03-04 DIAGNOSIS — R1115 Cyclical vomiting syndrome unrelated to migraine: Secondary | ICD-10-CM

## 2016-03-04 MED ORDER — ACETIC ACID 2 % OT SOLN: 4.0000 [drp] | Freq: Three times a day (TID) | OTIC | 0 refills | Status: DC

## 2016-03-04 NOTE — Progress Notes (Signed)
Patient ID: [name], female    DOB: May 17, 1951  Age: 65 y.o. MRN: [id number]    Subjective:  Subjective  HPI [name] presents for f/u er for nv and abd pain.  She got the zofran and took it just before she came.  She is feeling better  Review of Systems  Constitutional: Positive for fatigue. Negative for activity change, appetite change and unexpected weight change.  Respiratory: Negative for cough and shortness of breath.   Cardiovascular: Negative for chest pain and palpitations.  Gastrointestinal: Positive for abdominal pain, nausea and vomiting.  Psychiatric/Behavioral: Negative for behavioral problems and dysphoric mood. The patient is not nervous/anxious.     History Past Medical History:  Diagnosis Date  . Asthma   . Degenerative joint disease   . Diabetes mellitus   . GERD (gastroesophageal reflux disease)   . Hyperlipidemia    no longer on medication for this  . Hypertension    actually taking for DM renal protection, not DM  . Neuropathy ([place])    bilateral hands and feet  . Sickle cell trait ([place])   . Sleep apnea   . stomach ca dx'd 2010   chemo/xrt comp 05/2009    She has a past surgical history that includes Total knee arthroplasty; Total hip arthroplasty; Shoulder surgery; Appendectomy; Cesarean section; Hernia repair; Colon surgery; Esophagogastroduodenoscopy (N/A, 10/15/2012); and Esophagogastroduodenoscopy (N/A, 01/15/2016).   Her family history includes Alzheimer's disease (age of onset: 46) in her father; Cancer in her maternal grandfather; Cancer (age of onset: 26) in her mother; Diabetes in her brother and maternal grandmother.She reports that she quit smoking about 27 years ago. Her smoking use included Cigarettes. She started smoking about 42 years ago. She has a 15.00 pack-year smoking history. She has never used smokeless tobacco. She reports that she does not drink alcohol or use drugs.  Current Outpatient Prescriptions on File Prior to Visit    Medication Sig Dispense Refill  . aspirin 81 MG tablet Take 81 mg by mouth at bedtime.    . Blood Pressure Monitoring (B-D ASSURE BPM/MANUAL ARM CUFF) MISC Check blood pressure daily. 1 each 0  . carvedilol (COREG) 3.125 MG tablet Take 1 tablet (3.125 mg total) by mouth 2 (two) times daily with a meal. 60 tablet 3  . dicyclomine (BENTYL) 20 MG tablet Take 1 tablet (20 mg total) by mouth 4 (four) times daily -  before meals and at bedtime. 120 tablet 3  . DULoxetine (CYMBALTA) 60 MG capsule Take 1 capsule (60 mg total) by mouth 2 (two) times daily. 180 capsule 3  . EMBEDA 50-2 MG CPCR Take 1 capsule by mouth daily.    Marland Kitchen glimepiride (AMARYL) 4 MG tablet Take 1 tablet (4 mg total) by mouth 2 (two) times daily. 180 tablet 1  . HYDROcodone-acetaminophen (NORCO/VICODIN) 5-325 MG tablet Take 1 tablet by mouth every 6 (six) hours as needed. 15 tablet 0  . lidocaine-prilocaine (EMLA) cream Apply 1 application topically as needed. 30 g 5  . LORazepam (ATIVAN) 0.5 MG tablet Take 1 tablet (0.5 mg total) by mouth at bedtime as needed for anxiety. 30 tablet 0  . metaxalone (SKELAXIN) 800 MG tablet Take 1 tablet (800 mg total) by mouth 4 (four) times daily. (Patient taking differently: Take 800 mg by mouth 4 (four) times daily as needed for muscle spasms. ) 30 tablet 0  . metFORMIN (GLUCOPHAGE) 850 MG tablet Take 1 tablet (850 mg total) by mouth 2 (two) times daily. Repeat  labs are due now 180 tablet 1  . morphine (MSIR) 15 MG tablet Take 15 mg three times daily as needed for pain  0  . Multiple Vitamin (MULTIVITAMIN WITH MINERALS) TABS tablet Take 1 tablet by mouth daily.    . ondansetron (ZOFRAN-ODT) 8 MG disintegrating tablet Take 1 tablet (8 mg total) by mouth every 8 (eight) hours as needed for nausea or vomiting. 20 tablet 0  . pantoprazole (PROTONIX) 40 MG tablet Take 1 tablet (40 mg total) by mouth daily. 30 tablet 0   Current Facility-Administered Medications on File Prior to Visit  Medication Dose  Route Frequency Provider Last Rate Last Dose  . metoCLOPramide (REGLAN) tablet 10 mg  10 mg Oral TID AC & HS Yvonne R Lowne Chase, DO         Objective:  Objective  Physical Exam  Constitutional: She is oriented to person, place, and time. She appears well-developed and well-nourished.  HENT:  Head: Normocephalic and atraumatic.  Eyes: Conjunctivae and EOM are normal.  Neck: Normal range of motion. Neck supple. No JVD present. Carotid bruit is not present. No thyromegaly present.  Cardiovascular: Normal rate, regular rhythm and normal heart sounds.   No murmur heard. Pulmonary/Chest: Effort normal and breath sounds normal. No respiratory distress. She has no wheezes. She has no rales. She exhibits no tenderness.  Abdominal: She exhibits no distension. There is tenderness. There is no rebound and no guarding.  Musculoskeletal: She exhibits no edema.  Neurological: She is alert and oriented to person, place, and time.  Psychiatric: She has a normal mood and affect. Her behavior is normal. Judgment and thought content normal.  Nursing note and vitals reviewed.  BP 124/78 (BP Location: Left Arm, Patient Position: Sitting, Cuff Size: Normal)   Pulse 67   Temp 98.1 F (36.7 C) (Oral)   Wt 216 lb 9.6 oz (98.2 kg)   SpO2 99%   BMI 33.92 kg/m  Wt Readings from Last 3 Encounters:  03/04/16 216 lb 9.6 oz (98.2 kg)  02/27/16 217 lb (98.4 kg)  02/25/16 217 lb 9.6 oz (98.7 kg)     Lab Results  Component Value Date   WBC 8.2 02/27/2016   HGB 14.0 02/27/2016   HCT 39.1 02/27/2016   PLT 310 02/27/2016   GLUCOSE 186 (H) 02/27/2016   CHOL 185 07/07/2015   TRIG 144 07/07/2015   HDL 58 07/07/2015   LDLCALC 98 07/07/2015   ALT 33 02/27/2016   AST 84 (H) 02/27/2016   NA 140 02/27/2016   K 4.3 02/27/2016   CL 104 02/27/2016   CREATININE 1.40 (H) 02/27/2016   BUN 9 02/27/2016   CO2 21 (L) 02/27/2016   TSH 4.112 01/15/2016   INR 0.98 11/05/2010   HGBA1C 11.2 (H) 12/30/2015    MICROALBUR 0.3 04/06/2015    Ct Abdomen Pelvis W Contrast  Result Date: 02/27/2016 CLINICAL DATA:  Generalized diffuse abdominal pain. Nausea. Symptoms for 3 days. EXAM: CT ABDOMEN AND PELVIS WITH CONTRAST TECHNIQUE: Multidetector CT imaging of the abdomen and pelvis was performed using the standard protocol following bolus administration of intravenous contrast. CONTRAST:  51m ISOVUE-300 IOPAMIDOL (ISOVUE-300) INJECTION 61% COMPARISON:  Most recent CT comparison 01/13/2016 FINDINGS: Lower chest: Chronic scarring medial left lower lobe. No pleural fluid. Liver: Severe steatosis.  No evidence of focal lesion. Hepatobiliary: Gallbladder physiologically distended, no calcified stone. No biliary dilatation. Pancreas: Pancreatic atrophy.  No ductal dilatation or inflammation. Spleen: Normal. Adrenal glands: No nodule. Kidneys: Left upper pole renal  scarring with decreased profusion is unchanged from prior. There is no hydronephrosis or perinephric edema. Ureters are decompressed. Stomach/Bowel: Small hiatal hernia. Stomach physiologically distended. Postsurgical change post partial gastrectomy. No obvious gastric mass by CT. Questioning gastrojejunostomy. There are no dilated or thickened small bowel loops. No bowel obstruction, enteric contrast reaches the descending colon. Short segment of colonic wall thickening involving the mid transverse colon with minimal adjacent edema. Diverticulosis of the descending colon without acute diverticulitis. Post appendectomy. Vascular/Lymphatic: No retroperitoneal adenopathy. Abdominal aorta is normal in caliber. Atherosclerosis without aneurysm. Mesenteric vessels are patent. Reproductive: Uterus and adnexa are normal for age. Lower most pelvis partially obscured by streak artifact from right hip arthroplasty. Bladder: Obscured by streak artifact from right hip arthroplasty. Other: No free air, free fluid, or intra-abdominal fluid collection. Postsurgical change of the ventral  upper abdominal wall, stable in appearance from prior. Musculoskeletal: There are no acute or suspicious osseous abnormalities. Stable degenerative change in the lumbar spine with primarily facet arthropathy. Minimal anterolisthesis of L4 on L5 is again seen. Right hip arthroplasty. IMPRESSION: 1. Mild wall thickening adjacent edema of the mid transverse colon. This may be infectious or inflammatory. Neoplastic etiology is not excluded, and would be better assessed with direct visualization with colonoscopy. 2. Otherwise no acute abnormality in the abdomen/pelvis. Stable findings of severe hepatic steatosis, small hiatal hernia, distal colonic diverticulosis, partial gastrectomy and left renal scarring. Electronically Signed   By: Jeb Levering M.D.   On: 02/27/2016 21:54     Assessment & Plan:  Plan  I am having Ms. Harmening start on acetic acid. I am also having her maintain her multivitamin with minerals, aspirin, metaxalone, DULoxetine, lidocaine-prilocaine, LORazepam, morphine, metFORMIN, pantoprazole, dicyclomine, EMBEDA, carvedilol, B-D ASSURE BPM/MANUAL ARM CUFF, glimepiride, ondansetron, HYDROcodone-acetaminophen, OT ULTRA/FASTTK CNTRL SOLN, ONE TOUCH ULTRA 2, ONE TOUCH ULTRA TEST, and PHARMACIST CHOICE LANCETS. We will stop administering dicyclomine. Additionally, we will continue to administer metoCLOPramide.  Meds ordered this encounter  Medications  . Blood Glucose Calibration (OT ULTRA/FASTTK CNTRL SOLN) SOLN  . Blood Glucose Monitoring Suppl (ONE TOUCH ULTRA 2) w/Device KIT  . ONE TOUCH ULTRA TEST test strip  . PHARMACIST CHOICE LANCETS MISC  . acetic acid (VOSOL) 2 % otic solution    Sig: Place 4 drops into both ears 3 (three) times daily.    Dispense:  15 mL    Refill:  0    Problem List Items Addressed This Visit      Unprioritized   Generalized abdominal pain    abd pain with nausea and vomiting con't all meds'= She is feeling better She ate lunch today and held it  down ER spoke to Dr Joette Catching office If pain returns/ worsens -- go to ER       Other Visit Diagnoses    Intractable cyclical vomiting with nausea    -  Primary   Relevant Orders   AMB Referral to [facility]   DM (diabetes mellitus) type II controlled peripheral vascular disorder ([place])       Relevant Orders   AMB Referral to [facility]   Otalgia, left       Relevant Medications   acetic acid (VOSOL) 2 % otic solution      Follow-up: No Follow-up on file.  Ann Held, DO

## 2016-03-04 NOTE — Patient Instructions (Signed)

## 2016-03-04 NOTE — Assessment & Plan Note (Addendum)
abd pain with nausea and vomiting con't all meds'= She is feeling better She ate lunch today and held it down ER spoke to Dr Joette Catching office If pain returns/ worsens -- go to ER

## 2016-03-04 NOTE — Progress Notes (Signed)
Pre visit review using our clinic review tool, if applicable. No additional management support is needed unless otherwise documented below in the visit note. 

## 2016-03-08 ENCOUNTER — Telehealth: Payer: Self-pay | Admitting: Family Medicine

## 2016-03-08 NOTE — Telephone Encounter (Signed)
Ok to send--- does she have to sign a release?

## 2016-03-08 NOTE — Telephone Encounter (Signed)
Pt called in because she says that Pain Management would like to know why she has missed appt's, she would like to CMA and PCP to make them aware that it's due to her being in and out of the hospital. She says that they are requesting documentation.

## 2016-03-08 NOTE — Telephone Encounter (Signed)
Message   Let pt know that [facility] can not help either    ----- Message -----  From: [facility]  Sent: 03/07/2016  9:44 AM  To: Ann Held, DO, Synthia Innocent  Subject: Referral                       Thank you for this referral.  This patient is Not eligible for [facility].   Not a beneficiary currently attributed to one of the [place]. Membership roster used to verify non- eligible status.    Unfortunately we don't have a contract with [employer].     Thank you,  [place] [facility] Management Assistant

## 2016-03-08 NOTE — Telephone Encounter (Signed)
Spoke with patient and her pain management wants documentation advising her hospital and office visits. She missed 2 appointments and she is being charged $200. She missed June 23 and August 9th. [facility] Dr. Geanie Logan phone number [phone] and fax number is [phone].  Please advise if it ok to send notes.    KP

## 2016-03-10 NOTE — Telephone Encounter (Signed)
I called [facility] and they wanted information from the 23-28 th of June. Notes faxed.    KP

## 2016-03-11 ENCOUNTER — Ambulatory Visit: Payer: Self-pay | Admitting: Gastroenterology

## 2016-03-14 ENCOUNTER — Telehealth: Payer: Self-pay | Admitting: Family Medicine

## 2016-03-14 NOTE — Telephone Encounter (Signed)
[facility]  Patient Name: [name]  DOB: December 27, 1950    Initial Comment Caller states she has been having problems with vomiting and stomach ache.   Nurse Assessment  Nurse: Harlow Mares, RN, Suanne Marker Date/Time (Eastern Time): 03/14/2016 5:54:10 PM  Confirm and document reason for call. If symptomatic, describe symptoms. You must click the next button to save text entered. ---Caller states she has been having problems with vomiting and stomach ache. Reports that she has been having this issue for a couple of months now. Reports that if she eats one day (white rice, chicken and broccli) and today she had vomited the meal. Reports abd pain in the center of her abd, above her belly button. Reports that her last BM was 2 days ago and she was constipated. Denies fever.  Has the patient traveled out of the country within the last 30 days? ---Not Applicable  Does the patient have any new or worsening symptoms? ---Yes  Will a triage be completed? ---Yes  Related visit to physician within the last 2 weeks? ---Yes  Does the PT have any chronic conditions? (i.e. diabetes, asthma, etc.) ---Yes  List chronic conditions. ---diabetes;  Is this a behavioral health or substance abuse call? ---No     Guidelines    Guideline Title Affirmed Question Affirmed Notes  Abdominal Pain - Upper [1] MODERATE pain (e.g., interferes with normal activities) AND [2] comes and goes (cramps) AND [3] present > 24 hours (Exception: pain with Vomiting or Diarrhea - see that Guideline)    Final Disposition User   See Physician within [place], RN, Suanne Marker    Comments  Caller reports that she doesn't have transportation for a 24 hour appt, but has an appt this Friday on 03/18/16. States that if she needs assistance prior to this date, she will go to the ED.   Referrals  GO TO FACILITY REFUSED   Disagree/Comply: Disagree  Disagree/Comply Reason: Wait and see

## 2016-03-15 ENCOUNTER — Emergency Department (HOSPITAL_BASED_OUTPATIENT_CLINIC_OR_DEPARTMENT_OTHER)
Admission: EM | Admit: 2016-03-15 | Discharge: 2016-03-15 | Disposition: A | Discharge status: Home / Self Care | Payer: Medicare HMO | Attending: Physician Assistant | Admitting: Physician Assistant

## 2016-03-15 ENCOUNTER — Encounter (HOSPITAL_BASED_OUTPATIENT_CLINIC_OR_DEPARTMENT_OTHER): Payer: Self-pay

## 2016-03-15 DIAGNOSIS — Z79899 Other long term (current) drug therapy: Secondary | ICD-10-CM | POA: Diagnosis not present

## 2016-03-15 DIAGNOSIS — Z85028 Personal history of other malignant neoplasm of stomach: Secondary | ICD-10-CM | POA: Insufficient documentation

## 2016-03-15 DIAGNOSIS — Z87891 Personal history of nicotine dependence: Secondary | ICD-10-CM | POA: Insufficient documentation

## 2016-03-15 DIAGNOSIS — N182 Chronic kidney disease, stage 2 (mild): Secondary | ICD-10-CM | POA: Insufficient documentation

## 2016-03-15 DIAGNOSIS — I129 Hypertensive chronic kidney disease with stage 1 through stage 4 chronic kidney disease, or unspecified chronic kidney disease: Secondary | ICD-10-CM | POA: Diagnosis not present

## 2016-03-15 DIAGNOSIS — E119 Type 2 diabetes mellitus without complications: Secondary | ICD-10-CM | POA: Diagnosis not present

## 2016-03-15 DIAGNOSIS — R300 Dysuria: Secondary | ICD-10-CM | POA: Insufficient documentation

## 2016-03-15 DIAGNOSIS — R6883 Chills (without fever): Secondary | ICD-10-CM | POA: Insufficient documentation

## 2016-03-15 DIAGNOSIS — R1084 Generalized abdominal pain: Secondary | ICD-10-CM | POA: Diagnosis not present

## 2016-03-15 DIAGNOSIS — Z7984 Long term (current) use of oral hypoglycemic drugs: Secondary | ICD-10-CM | POA: Diagnosis not present

## 2016-03-15 DIAGNOSIS — R112 Nausea with vomiting, unspecified: Secondary | ICD-10-CM | POA: Diagnosis not present

## 2016-03-15 DIAGNOSIS — E1122 Type 2 diabetes mellitus with diabetic chronic kidney disease: Secondary | ICD-10-CM | POA: Insufficient documentation

## 2016-03-15 DIAGNOSIS — J45909 Unspecified asthma, uncomplicated: Secondary | ICD-10-CM | POA: Insufficient documentation

## 2016-03-15 DIAGNOSIS — Z7982 Long term (current) use of aspirin: Secondary | ICD-10-CM | POA: Diagnosis not present

## 2016-03-15 LAB — URINALYSIS, ROUTINE W REFLEX MICROSCOPIC
Bilirubin Urine: NEGATIVE
Glucose, UA: NEGATIVE mg/dL
Hgb urine dipstick: NEGATIVE
Ketones, ur: 15 mg/dL — AB
Nitrite: NEGATIVE
Protein, ur: NEGATIVE mg/dL
Specific Gravity, Urine: 1.013 (ref 1.005–1.030)
pH: 5.5 (ref 5.0–8.0)

## 2016-03-15 LAB — CBC WITH DIFFERENTIAL/PLATELET
Basophils Absolute: 0 10*3/uL (ref 0.0–0.1)
Basophils Relative: 0 %
Eosinophils Absolute: 0.2 10*3/uL (ref 0.0–0.7)
Eosinophils Relative: 3 %
HCT: 35.3 % — ABNORMAL LOW (ref 36.0–46.0)
Hemoglobin: 12.7 g/dL (ref 12.0–15.0)
Lymphocytes Relative: 19 %
Lymphs Abs: 1.4 10*3/uL (ref 0.7–4.0)
MCH: 30.8 pg (ref 26.0–34.0)
MCHC: 36 g/dL (ref 30.0–36.0)
MCV: 85.7 fL (ref 78.0–100.0)
Monocytes Absolute: 0.6 10*3/uL (ref 0.1–1.0)
Monocytes Relative: 8 %
Neutro Abs: 5.1 10*3/uL (ref 1.7–7.7)
Neutrophils Relative %: 70 %
Platelets: 325 10*3/uL (ref 150–400)
RBC: 4.12 MIL/uL (ref 3.87–5.11)
RDW: 15.4 % (ref 11.5–15.5)
WBC: 7.2 10*3/uL (ref 4.0–10.5)

## 2016-03-15 LAB — URINE MICROSCOPIC-ADD ON

## 2016-03-15 LAB — COMPREHENSIVE METABOLIC PANEL
ALT: 30 U/L (ref 14–54)
AST: 51 U/L — ABNORMAL HIGH (ref 15–41)
Albumin: 2.6 g/dL — ABNORMAL LOW (ref 3.5–5.0)
Alkaline Phosphatase: 58 U/L (ref 38–126)
Anion gap: 9 (ref 5–15)
BUN: 21 mg/dL — ABNORMAL HIGH (ref 6–20)
CO2: 24 mmol/L (ref 22–32)
Calcium: 8.3 mg/dL — ABNORMAL LOW (ref 8.9–10.3)
Chloride: 107 mmol/L (ref 101–111)
Creatinine, Ser: 1.8 mg/dL — ABNORMAL HIGH (ref 0.44–1.00)
GFR calc Af Amer: 33 mL/min — ABNORMAL LOW (ref >60)
GFR calc non Af Amer: 29 mL/min — ABNORMAL LOW (ref >60)
Glucose, Bld: 158 mg/dL — ABNORMAL HIGH (ref 65–99)
Potassium: 4.7 mmol/L (ref 3.5–5.1)
Sodium: 140 mmol/L (ref 135–145)
Total Bilirubin: 0.6 mg/dL (ref 0.3–1.2)
Total Protein: 6 g/dL — ABNORMAL LOW (ref 6.5–8.1)

## 2016-03-15 MED ORDER — HEPARIN SOD (PORK) LOCK FLUSH 100 UNIT/ML IV SOLN
INTRAVENOUS | Status: AC
  Administered 2016-03-15: 500 [IU]
  Filled 2016-03-15: qty 5

## 2016-03-15 MED ORDER — TRAMADOL HCL 50 MG PO TABS: 50.0000 mg | ORAL_TABLET | Freq: Four times a day (QID) | ORAL | 0 refills | Status: DC | PRN

## 2016-03-15 MED ORDER — MORPHINE SULFATE (PF) 4 MG/ML IV SOLN
4.0000 mg | Freq: Once | INTRAVENOUS | Status: AC
  Administered 2016-03-15: 4 mg via INTRAVENOUS
  Filled 2016-03-15: qty 1

## 2016-03-15 MED ORDER — SODIUM CHLORIDE 0.9 % IV BOLUS (SEPSIS)
1000.0000 mL | Freq: Once | INTRAVENOUS | Status: AC
  Administered 2016-03-15: 1000 mL via INTRAVENOUS

## 2016-03-15 NOTE — ED Triage Notes (Signed)
C/o abd pain n/v/d x 2 months-states she has been seen by PCP "at least 4 times-they told ne to come get fluids today"-NAD-steady gait

## 2016-03-15 NOTE — Telephone Encounter (Signed)
Patient has a pending apt on 03/18/16.    KP

## 2016-03-15 NOTE — Discharge Instructions (Signed)
Return here as needed.  Follow-up with your primary doctor and GI doctor

## 2016-03-15 NOTE — Telephone Encounter (Signed)
Patient wanted to inform PCP she's currently being seen in the [facility] ED

## 2016-03-15 NOTE — Telephone Encounter (Signed)
Pt needs appointment sooner or go to ER

## 2016-03-15 NOTE — Telephone Encounter (Signed)
Pt states she has been feeling nauseated with vomiting since Saturday.  Unable to keep anything down other than water.  She says that she feel weak and slightly dizzy especially when she moves around.  She says she has been resting all day because she was afraid to get up by herself.  Says her daughter should be home soon.  Once she gets home, pt states she will go to the ER.

## 2016-03-15 NOTE — Telephone Encounter (Signed)
Sounds like she needs IVF----anyway she can be seen sooner

## 2016-03-15 NOTE — ED Provider Notes (Signed)
[facility] Provider Note   CSN: [id number] Arrival date & time: 03/15/16  1619  By signing my name below, I, [name], attest that this documentation has been prepared under the direction and in the presence of non-physician practitioner, Irena Cords, PA-C. Electronically Signed: Jeanell Jefferson, Scribe. 03/15/2016. 5:05 PM.  History   Chief Complaint Chief Complaint  Patient presents with  . Abdominal Pain   The history is provided by the patient. No language interpreter was used.   HPI Comments: [name] is a 65 y.o. female with a hx of stomach CA (in 2011) who presents to the Emergency Department complaining of constant moderate generalized abdominal pain that started 2 months ago. She states that she has seen her PCP 4 times for the pain with associated nausea and vomiting, but her symptoms still persist. She reports also having chills, dysuria, and appetite loss. She reports that she also saw her gastroenterologist (Dr. Michail Sermon), but could not find a diagnosis, and so she will return in a week for more diagnostic tests. She states that her treatment for her stomach CA in 2011 included 2/3 gastrectomy, radiation, and chemotherapy. She reports having a hx of an appendectomy and DM. She denies any hx of cardiac complications. She also denies any fever.      PCP: Ann Held, DO  Past Medical History:  Diagnosis Date  . Asthma   . Degenerative joint disease   . Diabetes mellitus   . GERD (gastroesophageal reflux disease)   . Hyperlipidemia    no longer on medication for this  . Hypertension    actually taking for DM renal protection, not DM  . Neuropathy ([place])    bilateral hands and feet  . Sickle cell trait ([place])   . Sleep apnea   . stomach ca dx'd 2010   chemo/xrt comp 05/2009    Patient Active Problem List   Diagnosis Date Noted  . Hypotension 02/03/2016  . Syncope 02/03/2016  . Hypomagnesemia 02/03/2016  . AKI (acute kidney injury)  ([place]) 02/03/2016  . Gastrointestinal dysmotility 01/20/2016  . CKD stage 2 due to type 2 diabetes mellitus ([place]) 01/20/2016  . Abscess of right thigh 01/20/2016  . Chronic pain syndrome 01/15/2016  . Diarrhea 01/15/2016  . Generalized abdominal pain 01/14/2016  . Uncontrolled type 2 diabetes mellitus with diabetic polyneuropathy, without long-term current use of insulin ([place]) 12/30/2015  . Morbid obesity due to excess calories ([place]) 05/05/2015  . Diabetic polyneuropathy associated with type 2 diabetes mellitus ([place]) 01/02/2015  . Acute bacterial sinusitis 01/02/2015  . Onychomycosis 11/04/2014  . Pain in lower limb 11/04/2014  . Multinodular goiter (nontoxic) 10/16/2014  . CVA (cerebral infarction) 08/11/2014  . Iron deficiency anemia 05/20/2014  . Major depressive disorder, single episode, moderate ([place]) 05/20/2014  . Dizziness of unknown cause 04/28/2014  . MDD (major depressive disorder), recurrent episode, moderate ([place]) 01/28/2014  . GERD (gastroesophageal reflux disease) 01/02/2014  . Nausea 10/15/2012  . Stomach cancer ([place]) 12/10/2008  . CHRONIC RESPIRATORY FAILURE 01/03/2008  . Hyperlipidemia 06/27/2007  . OBESITY, MORBID 06/27/2007  . SLEEP APNEA, OBSTRUCTIVE 06/27/2007  . Essential hypertension 06/27/2007  . Asthma 06/27/2007  . OSTEOARTHRITIS  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 06/27/2007    Past Surgical History:  Procedure Laterality Date  . APPENDECTOMY    . CESAREAN SECTION    . COLON SURGERY    . ESOPHAGOGASTRODUODENOSCOPY N/A 10/15/2012   Procedure: ESOPHAGOGASTRODUODENOSCOPY (EGD);  Surgeon: Lear Ng, MD;  Location: [facility] END[facility]COPY;  Service: Endoscopy;  Laterality: N/A;  . ESOPHAGOGASTRODUODENOSCOPY N/A 01/15/2016   Procedure: ESOPHAGOGASTRODUODENOSCOPY (EGD);  Surgeon: Ronald Lobo, MD;  Location: [facility];  Service: Endoscopy;  Laterality: N/A;  . HERNIA REPAIR    . SHOULDER SURGERY     Left  . TOTAL HIP ARTHROPLASTY     Right  . TOTAL KNEE  ARTHROPLASTY     Left    OB History    Gravida Para Term Preterm AB Living   4 4       4    SAB TAB Ectopic Multiple Live Births                   Home Medications    Prior to Admission medications   Medication Sig Start Date End Date Taking? Authorizing Provider  acetic acid (VOSOL) 2 % otic solution Place 4 drops into both ears 3 (three) times daily. 03/04/16   Rosalita Chessman Chase, DO  aspirin 81 MG tablet Take 81 mg by mouth at bedtime.    Historical Provider, MD  Blood Glucose Calibration (OT ULTRA/FASTTK CNTRL SOLN) SOLN  02/17/16   Historical Provider, MD  Blood Glucose Monitoring Suppl (ONE TOUCH ULTRA 2) w/Device KIT  02/17/16   Historical Provider, MD  Blood Pressure Monitoring (B-D ASSURE BPM/MANUAL ARM CUFF) MISC Check blood pressure daily. 02/18/16   Rosalita Chessman Chase, DO  carvedilol (COREG) 3.125 MG tablet Take 1 tablet (3.125 mg total) by mouth 2 (two) times daily with a meal. 02/16/16   Rosalita Chessman Chase, DO  dicyclomine (BENTYL) 20 MG tablet Take 1 tablet (20 mg total) by mouth 4 (four) times daily -  before meals and at bedtime. 02/15/16   Volanda Napoleon, MD  DULoxetine (CYMBALTA) 60 MG capsule Take 1 capsule (60 mg total) by mouth 2 (two) times daily. 06/09/15   Yvonne R Lowne Chase, DO  EMBEDA 50-2 MG CPCR Take 1 capsule by mouth daily. 02/11/16   Historical Provider, MD  glimepiride (AMARYL) 4 MG tablet Take 1 tablet (4 mg total) by mouth 2 (two) times daily. 02/19/16   Rosalita Chessman Chase, DO  HYDROcodone-acetaminophen (NORCO/VICODIN) 5-325 MG tablet Take 1 tablet by mouth every 6 (six) hours as needed. 02/27/16   Gloriann Loan, PA-C  lidocaine-prilocaine (EMLA) cream Apply 1 application topically as needed. 06/29/15   Volanda Napoleon, MD  LORazepam (ATIVAN) 0.5 MG tablet Take 1 tablet (0.5 mg total) by mouth at bedtime as needed for anxiety. 08/21/15   Alferd Apa Lowne Chase, DO  metaxalone (SKELAXIN) 800 MG tablet Take 1 tablet (800 mg total) by mouth 4 (four) times  daily. Patient taking differently: Take 800 mg by mouth 4 (four) times daily as needed for muscle spasms.  04/06/15   Rosalita Chessman Chase, DO  metFORMIN (GLUCOPHAGE) 850 MG tablet Take 1 tablet (850 mg total) by mouth 2 (two) times daily. Repeat labs are due now 12/30/15   Philemon Kingdom, MD  morphine (MSIR) 15 MG tablet Take 15 mg three times daily as needed for pain 12/18/15   Historical Provider, MD  Multiple Vitamin (MULTIVITAMIN WITH MINERALS) TABS tablet Take 1 tablet by mouth daily.    Historical Provider, MD  ondansetron (ZOFRAN-ODT) 8 MG disintegrating tablet Take 1 tablet (8 mg total) by mouth every 8 (eight) hours as needed for nausea or vomiting. 02/25/16   Rosalita Chessman Chase, DO  ONE TOUCH ULTRA TEST test strip  02/17/16   Historical  Provider, MD  pantoprazole (PROTONIX) 40 MG tablet Take 1 tablet (40 mg total) by mouth daily. 01/13/16   Virgel Manifold, MD  PHARMACIST CHOICE LANCETS [place]  02/17/16   Historical Provider, MD    Family History Family History  Problem Relation Age of Onset  . Diabetes Brother   . Alzheimer's disease Father 61  . Cancer Mother 21  . Diabetes Maternal Grandmother   . Cancer Maternal Grandfather     lung  . Suicidality Neg Hx   . Depression Neg Hx   . Dementia Neg Hx   . Anxiety disorder Neg Hx     Social History Social History  Substance Use Topics  . Smoking status: Former Smoker    Packs/day: 1.00    Years: 15.00    Types: Cigarettes    Start date: 09/27/1973    Quit date: 10/15/1988  . Smokeless tobacco: Never Used     Comment: quit smoking 14 years ago  . Alcohol use No     Allergies   Adhesive [tape]; Codeine; Zocor [simvastatin - high dose]; and Penicillins   Review of Systems Review of Systems  Constitutional: Positive for chills. Negative for fever.  Gastrointestinal: Positive for abdominal pain (generalized), nausea and vomiting.  Genitourinary: Positive for dysuria.    Physical Exam Updated Vital Signs BP 113/78 (BP  Location: Left Arm)   Pulse 87   Temp 98.3 F (36.8 C) (Oral)   Resp 20   Ht 5' 7"  (1.702 m)   Wt 210 lb (95.3 kg)   SpO2 97%   BMI 32.89 kg/m   Physical Exam  Constitutional: She appears well-developed and well-nourished. No distress.  HENT:  Head: Normocephalic and atraumatic.  Eyes: Conjunctivae are normal.  Neck: Neck supple.  Cardiovascular: Normal rate and regular rhythm.   No murmur heard. Pulmonary/Chest: Effort normal and breath sounds normal. No respiratory distress.  Abdominal: Soft. Bowel sounds are normal. There is tenderness (diffuse). There is guarding (diffuse).  Diffuse guarding and TTP in the abdomen. Good bowel sounds.   Musculoskeletal: She exhibits no edema.  Neurological: She is alert.  Skin: Skin is warm and dry.  Psychiatric: She has a normal mood and affect.  Nursing note and vitals reviewed.    ED Treatments / Results  DIAGNOSTIC STUDIES: Oxygen Saturation is 97% on RA, normal by my interpretation.    COORDINATION OF CARE: 5:10 PM- Pt advised of plan for treatment, which includes labs, and pt agrees.  Labs (all labs ordered are listed, but only abnormal results are displayed) Labs Reviewed  COMPREHENSIVE METABOLIC PANEL  CBC WITH DIFFERENTIAL/PLATELET  URINALYSIS, ROUTINE W REFLEX MICROSCOPIC (NOT AT [facility])    EKG  EKG Interpretation None       Radiology No results found.  Procedures Procedures (including critical care time)  Medications Ordered in ED Medications - No data to display   Initial Impression / Assessment and Plan / ED Course  I have reviewed the triage vital signs and the nursing notes.  Pertinent labs & imaging results that were available during my care of the patient were reviewed by me and considered in my medical decision making (see chart for details).  Clinical Course     Patient is nontoxic, nonseptic appearing, in no apparent distress.  Patient's pain and other symptoms adequately managed in emergency  department. Labs, imaging and vitals reviewed.  Patient does not meet the SIRS or Sepsis criteria.  On repeat exam patient does not have a surgical abdomin and there  are no peritoneal signs.  No indication of appendicitis, bowel obstruction, bowel perforation, cholecystitis, diverticulitis.  Patient discharged home with symptomatic treatment and given strict instructions for follow-up with their primary care physician.  I have also discussed reasons to return immediately to the ER.  Patient expresses understanding and agrees with plan.  Doren Custard the patient is having chronic abdominal pain.  She does have an appointment coming up with her GI doctor advised her to return here as needed.  Patient agrees the plan and all questions were answered   Final Clinical Impressions(s) / ED Diagnoses   Final diagnoses:  None    New Prescriptions New Prescriptions   No medications on file      Dalia Heading, PA-C 03/15/16 2131    [place], MD 03/15/16 2319

## 2016-03-18 ENCOUNTER — Encounter: Payer: Self-pay | Admitting: Family Medicine

## 2016-03-18 ENCOUNTER — Ambulatory Visit (INDEPENDENT_AMBULATORY_CARE_PROVIDER_SITE_OTHER): Payer: Medicare HMO | Admitting: Family Medicine

## 2016-03-18 VITALS — BP 118/66 | Temp 98.6°F | Wt 207.2 lb

## 2016-03-18 DIAGNOSIS — R1110 Vomiting, unspecified: Secondary | ICD-10-CM | POA: Diagnosis not present

## 2016-03-18 MED ORDER — NONFORMULARY OR COMPOUNDED ITEM
0 refills | Status: DC
Start: 2016-03-18 — End: 2016-06-27

## 2016-03-18 MED ORDER — PROMETHAZINE HCL 25 MG/ML IJ SOLN
50.0000 mg | Freq: Once | INTRAMUSCULAR | Status: AC
  Administered 2016-03-18: 50 mg via INTRAMUSCULAR

## 2016-03-18 MED ORDER — KETOROLAC TROMETHAMINE 60 MG/2ML IM SOLN
60.0000 mg | Freq: Once | INTRAMUSCULAR | Status: AC
  Administered 2016-03-18: 60 mg via INTRAMUSCULAR

## 2016-03-18 NOTE — Progress Notes (Signed)
Patient ID: [name], female    DOB: [birth year]  Age: 65 y.o. MRN: [id number]    Subjective:  Subjective  HPI [name] presents for f/u er for abd pain and n/v.  Pt has been to er several times.  Pt has GI appointment on Tuesday.  Pt has been taking zofran, bentyl, pain meds and protonix with little relief.  She states the toradol given in er helps some but doesn't last long.  She is out of her morphine but has not been able to get to pain management because she has been in the hospital so much.    Review of Systems  Constitutional: Positive for appetite change, fatigue and unexpected weight change. Negative for diaphoresis.  Eyes: Negative for pain, redness and visual disturbance.  Respiratory: Negative for cough, chest tightness, shortness of breath and wheezing.   Cardiovascular: Negative for chest pain, palpitations and leg swelling.  Gastrointestinal: Positive for abdominal pain, nausea and vomiting.  Endocrine: Negative for cold intolerance, heat intolerance, polydipsia, polyphagia and polyuria.  Genitourinary: Negative for difficulty urinating, dysuria and frequency.  Neurological: Negative for dizziness, light-headedness, numbness and headaches.    History Past Medical History:  Diagnosis Date  . Asthma   . Degenerative joint disease   . Diabetes mellitus   . GERD (gastroesophageal reflux disease)   . Hyperlipidemia    no longer on medication for this  . Hypertension    actually taking for DM renal protection, not DM  . Neuropathy ([place])    bilateral hands and feet  . Sickle cell trait ([place])   . Sleep apnea   . stomach ca dx'd 2010   chemo/xrt comp 05/2009    She has a past surgical history that includes Total knee arthroplasty; Total hip arthroplasty; Shoulder surgery; Appendectomy; Cesarean section; Hernia repair; Colon surgery; Esophagogastroduodenoscopy (N/A, 10/15/2012); and Esophagogastroduodenoscopy (N/A, 01/15/2016).   Her family history includes  Alzheimer's disease (age of onset: 49) in her father; Cancer in her maternal grandfather; Cancer (age of onset: 45) in her mother; Diabetes in her brother and maternal grandmother.She reports that she quit smoking about 27 years ago. Her smoking use included Cigarettes. She started smoking about 42 years ago. She has a 15.00 pack-year smoking history. She has never used smokeless tobacco. She reports that she does not drink alcohol or use drugs.  Current Outpatient Prescriptions on File Prior to Visit  Medication Sig Dispense Refill  . acetic acid (VOSOL) 2 % otic solution Place 4 drops into both ears 3 (three) times daily. 15 mL 0  . aspirin 81 MG tablet Take 81 mg by mouth at bedtime.    . Blood Glucose Calibration (OT ULTRA/FASTTK CNTRL SOLN) SOLN     . Blood Glucose Monitoring Suppl (ONE TOUCH ULTRA 2) w/Device KIT     . Blood Pressure Monitoring (B-D ASSURE BPM/MANUAL ARM CUFF) MISC Check blood pressure daily. 1 each 0  . carvedilol (COREG) 3.125 MG tablet Take 1 tablet (3.125 mg total) by mouth 2 (two) times daily with a meal. 60 tablet 3  . dicyclomine (BENTYL) 20 MG tablet Take 1 tablet (20 mg total) by mouth 4 (four) times daily -  before meals and at bedtime. 120 tablet 3  . DULoxetine (CYMBALTA) 60 MG capsule Take 1 capsule (60 mg total) by mouth 2 (two) times daily. 180 capsule 3  . EMBEDA 50-2 MG CPCR Take 1 capsule by mouth daily.    Marland Kitchen glimepiride (AMARYL) 4 MG tablet Take 1 tablet (  4 mg total) by mouth 2 (two) times daily. 180 tablet 1  . HYDROcodone-acetaminophen (NORCO/VICODIN) 5-325 MG tablet Take 1 tablet by mouth every 6 (six) hours as needed. 15 tablet 0  . lidocaine-prilocaine (EMLA) cream Apply 1 application topically as needed. 30 g 5  . LORazepam (ATIVAN) 0.5 MG tablet Take 1 tablet (0.5 mg total) by mouth at bedtime as needed for anxiety. 30 tablet 0  . metaxalone (SKELAXIN) 800 MG tablet Take 1 tablet (800 mg total) by mouth 4 (four) times daily. (Patient taking  differently: Take 800 mg by mouth 4 (four) times daily as needed for muscle spasms. ) 30 tablet 0  . metFORMIN (GLUCOPHAGE) 850 MG tablet Take 1 tablet (850 mg total) by mouth 2 (two) times daily. Repeat labs are due now 180 tablet 1  . morphine (MSIR) 15 MG tablet Take 15 mg three times daily as needed for pain  0  . Multiple Vitamin (MULTIVITAMIN WITH MINERALS) TABS tablet Take 1 tablet by mouth daily.    . ondansetron (ZOFRAN-ODT) 8 MG disintegrating tablet Take 1 tablet (8 mg total) by mouth every 8 (eight) hours as needed for nausea or vomiting. 20 tablet 0  . ONE TOUCH ULTRA TEST test strip     . pantoprazole (PROTONIX) 40 MG tablet Take 1 tablet (40 mg total) by mouth daily. 30 tablet 0  . PHARMACIST CHOICE LANCETS MISC     . traMADol (ULTRAM) 50 MG tablet Take 1 tablet (50 mg total) by mouth every 6 (six) hours as needed for severe pain. 15 tablet 0   Current Facility-Administered Medications on File Prior to Visit  Medication Dose Route Frequency Provider Last Rate Last Dose  . metoCLOPramide (REGLAN) tablet 10 mg  10 mg Oral TID AC & HS Ysidro Ramsay R Lowne Chase, DO         Objective:  Objective  Physical Exam  Constitutional: She is oriented to person, place, and time. She appears well-developed and well-nourished.  HENT:  Head: Normocephalic and atraumatic.  Eyes: Conjunctivae and EOM are normal.  Neck: Normal range of motion. Neck supple. No JVD present. Carotid bruit is not present. No thyromegaly present.  Cardiovascular: Normal rate, regular rhythm and normal heart sounds.   No murmur heard. Pulmonary/Chest: Effort normal and breath sounds normal. No respiratory distress. She has no wheezes. She has no rales. She exhibits no tenderness.  Abdominal: Bowel sounds are normal. There is tenderness. There is guarding.  Musculoskeletal: She exhibits no edema.  Neurological: She is alert and oriented to person, place, and time.  Psychiatric: She has a normal mood and affect.  Nursing  note and vitals reviewed.  BP 118/66 (BP Location: Left Arm, Patient Position: Sitting, Cuff Size: Normal)   Temp 98.6 F (37 C) (Oral)   Wt 207 lb 3.2 oz (94 kg)   BMI 32.45 kg/m  Wt Readings from Last 3 Encounters:  03/18/16 207 lb 3.2 oz (94 kg)  03/15/16 210 lb (95.3 kg)  03/04/16 216 lb 9.6 oz (98.2 kg)     Lab Results  Component Value Date   WBC 7.2 03/15/2016   HGB 12.7 03/15/2016   HCT 35.3 (L) 03/15/2016   PLT 325 03/15/2016   GLUCOSE 158 (H) 03/15/2016   CHOL 185 07/07/2015   TRIG 144 07/07/2015   HDL 58 07/07/2015   LDLCALC 98 07/07/2015   ALT 30 03/15/2016   AST 51 (H) 03/15/2016   NA 140 03/15/2016   K 4.7 03/15/2016   CL  107 03/15/2016   CREATININE 1.80 (H) 03/15/2016   BUN 21 (H) 03/15/2016   CO2 24 03/15/2016   TSH 4.112 01/15/2016   INR 0.98 11/05/2010   HGBA1C 11.2 (H) 12/30/2015   MICROALBUR 0.3 04/06/2015    No results found.   Assessment & Plan:  Plan  I am having [name] start on NONFORMULARY OR COMPOUNDED ITEM. I am also having her maintain her multivitamin with minerals, aspirin, metaxalone, DULoxetine, lidocaine-prilocaine, LORazepam, morphine, metFORMIN, pantoprazole, dicyclomine, EMBEDA, carvedilol, B-D ASSURE BPM/MANUAL ARM CUFF, glimepiride, ondansetron, HYDROcodone-acetaminophen, OT ULTRA/FASTTK CNTRL SOLN, ONE TOUCH ULTRA 2, ONE TOUCH ULTRA TEST, PHARMACIST CHOICE LANCETS, acetic acid, and traMADol. We administered ketorolac and promethazine. We will continue to administer metoCLOPramide.  Meds ordered this encounter  Medications  . NONFORMULARY OR COMPOUNDED ITEM    Sig: IVF NSS 1 Liter over 1 hour    Dispense:  1 each    Refill:  0  . ketorolac (TORADOL) injection 60 mg  . promethazine (PHENERGAN) injection 50 mg    Problem List Items Addressed This Visit    None    Visit Diagnoses    Intractable vomiting with nausea, vomiting of unspecified type    -  Primary   Relevant Medications   NONFORMULARY OR COMPOUNDED ITEM    ketorolac (TORADOL) injection 60 mg (Completed)   promethazine (PHENERGAN) injection 50 mg (Completed)    will arrange for ivf x 1 liter at home  F/u GI next week If symptoms worsen-- go to ER  Follow-up: No Follow-up on file.  Ann Held, DO

## 2016-03-18 NOTE — Patient Instructions (Signed)

## 2016-03-18 NOTE — Progress Notes (Signed)
Pre visit review using our clinic review tool, if applicable. No additional management support is needed unless otherwise documented below in the visit note. 

## 2016-03-22 ENCOUNTER — Emergency Department (HOSPITAL_COMMUNITY): Payer: Medicare HMO

## 2016-03-22 ENCOUNTER — Encounter (HOSPITAL_COMMUNITY): Payer: Self-pay

## 2016-03-22 ENCOUNTER — Emergency Department (HOSPITAL_COMMUNITY)
Admission: EM | Admit: 2016-03-22 | Discharge: 2016-03-22 | Disposition: A | Discharge status: Home / Self Care | Payer: Medicare HMO | Attending: Emergency Medicine | Admitting: Emergency Medicine

## 2016-03-22 DIAGNOSIS — E1122 Type 2 diabetes mellitus with diabetic chronic kidney disease: Secondary | ICD-10-CM | POA: Insufficient documentation

## 2016-03-22 DIAGNOSIS — R1032 Left lower quadrant pain: Secondary | ICD-10-CM

## 2016-03-22 DIAGNOSIS — R109 Unspecified abdominal pain: Secondary | ICD-10-CM | POA: Diagnosis present

## 2016-03-22 DIAGNOSIS — R531 Weakness: Secondary | ICD-10-CM

## 2016-03-22 DIAGNOSIS — Z85028 Personal history of other malignant neoplasm of stomach: Secondary | ICD-10-CM | POA: Diagnosis not present

## 2016-03-22 DIAGNOSIS — Z7984 Long term (current) use of oral hypoglycemic drugs: Secondary | ICD-10-CM | POA: Diagnosis not present

## 2016-03-22 DIAGNOSIS — Z7982 Long term (current) use of aspirin: Secondary | ICD-10-CM | POA: Diagnosis not present

## 2016-03-22 DIAGNOSIS — R634 Abnormal weight loss: Secondary | ICD-10-CM | POA: Diagnosis present

## 2016-03-22 DIAGNOSIS — J45909 Unspecified asthma, uncomplicated: Secondary | ICD-10-CM | POA: Diagnosis not present

## 2016-03-22 DIAGNOSIS — Z96643 Presence of artificial hip joint, bilateral: Secondary | ICD-10-CM | POA: Diagnosis not present

## 2016-03-22 DIAGNOSIS — N182 Chronic kidney disease, stage 2 (mild): Secondary | ICD-10-CM | POA: Diagnosis not present

## 2016-03-22 DIAGNOSIS — E1140 Type 2 diabetes mellitus with diabetic neuropathy, unspecified: Secondary | ICD-10-CM | POA: Diagnosis not present

## 2016-03-22 DIAGNOSIS — R1084 Generalized abdominal pain: Secondary | ICD-10-CM | POA: Diagnosis present

## 2016-03-22 DIAGNOSIS — Z87891 Personal history of nicotine dependence: Secondary | ICD-10-CM | POA: Diagnosis not present

## 2016-03-22 DIAGNOSIS — K9289 Other specified diseases of the digestive system: Secondary | ICD-10-CM | POA: Diagnosis present

## 2016-03-22 DIAGNOSIS — I129 Hypertensive chronic kidney disease with stage 1 through stage 4 chronic kidney disease, or unspecified chronic kidney disease: Secondary | ICD-10-CM | POA: Diagnosis not present

## 2016-03-22 LAB — URINALYSIS, ROUTINE W REFLEX MICROSCOPIC
Glucose, UA: NEGATIVE mg/dL
HGB URINE DIPSTICK: NEGATIVE
Ketones, ur: 15 mg/dL — AB
Nitrite: NEGATIVE
Protein, ur: NEGATIVE mg/dL
Specific Gravity, Urine: 1.015 (ref 1.005–1.030)
pH: 5.5 (ref 5.0–8.0)

## 2016-03-22 LAB — COMPREHENSIVE METABOLIC PANEL
ALBUMIN: 2.8 g/dL — AB (ref 3.5–5.0)
ALK PHOS: 91 U/L (ref 38–126)
ALT: 36 U/L (ref 14–54)
ANION GAP: 13 (ref 5–15)
AST: 50 U/L — AB (ref 15–41)
BUN: 15 mg/dL (ref 6–20)
CALCIUM: 8.7 mg/dL — AB (ref 8.9–10.3)
CO2: 20 mmol/L — AB (ref 22–32)
CREATININE: 1.65 mg/dL — AB (ref 0.44–1.00)
Chloride: 108 mmol/L (ref 101–111)
GFR calc Af Amer: 37 mL/min — ABNORMAL LOW (ref >60)
GFR calc non Af Amer: 32 mL/min — ABNORMAL LOW (ref >60)
GLUCOSE: 190 mg/dL — AB (ref 65–99)
Potassium: 4.5 mmol/L (ref 3.5–5.1)
SODIUM: 141 mmol/L (ref 135–145)
Total Bilirubin: 1.2 mg/dL (ref 0.3–1.2)
Total Protein: 6.7 g/dL (ref 6.5–8.1)

## 2016-03-22 LAB — URINE MICROSCOPIC-ADD ON: RBC / HPF: NONE SEEN RBC/hpf (ref 0–5)

## 2016-03-22 LAB — LIPASE, BLOOD: Lipase: 12 U/L (ref 11–51)

## 2016-03-22 LAB — CBC
HCT: 40 % (ref 36.0–46.0)
HEMOGLOBIN: 13.6 g/dL (ref 12.0–15.0)
MCH: 30.2 pg (ref 26.0–34.0)
MCHC: 34 g/dL (ref 30.0–36.0)
MCV: 88.7 fL (ref 78.0–100.0)
Platelets: 309 10*3/uL (ref 150–400)
RBC: 4.51 MIL/uL (ref 3.87–5.11)
RDW: 15.2 % (ref 11.5–15.5)
WBC: 8.3 10*3/uL (ref 4.0–10.5)

## 2016-03-22 LAB — I-STAT CG4 LACTIC ACID, ED
Lactic Acid, Venous: 3.29 mmol/L (ref 0.5–1.9)
Lactic Acid, Venous: 1.64 mmol/L (ref 0.5–1.9)
Lactic Acid, Venous: 1.3 mmol/L (ref 0.5–1.9)

## 2016-03-22 MED ORDER — IOPAMIDOL (ISOVUE-300) INJECTION 61%
INTRAVENOUS | Status: AC
  Filled 2016-03-22: qty 100

## 2016-03-22 MED ORDER — SODIUM CHLORIDE 0.9 % IV BOLUS (SEPSIS)
1000.0000 mL | Freq: Once | INTRAVENOUS | Status: AC
  Administered 2016-03-22: 1000 mL via INTRAVENOUS

## 2016-03-22 MED ORDER — MORPHINE SULFATE (PF) 4 MG/ML IV SOLN
4.0000 mg | Freq: Once | INTRAVENOUS | Status: AC
  Administered 2016-03-22: 4 mg via INTRAVENOUS
  Filled 2016-03-22: qty 1

## 2016-03-22 MED ORDER — HEPARIN SOD (PORK) LOCK FLUSH 100 UNIT/ML IV SOLN
500.0000 [IU] | Freq: Once | INTRAVENOUS | Status: AC
  Administered 2016-03-22: 500 [IU]
  Filled 2016-03-22: qty 5

## 2016-03-22 NOTE — ED Notes (Signed)
Pt unable to urinate at this time. Pt will try again later.

## 2016-03-22 NOTE — ED Notes (Signed)
Patient transported to CT 

## 2016-03-22 NOTE — ED Notes (Signed)
REpaged [facility]/OUTLAW to [employer]

## 2016-03-22 NOTE — ED Notes (Signed)
Fuller Song, MD and Jarrett Soho, RN of [facility].

## 2016-03-22 NOTE — ED Triage Notes (Signed)
Patient here with generalized recurrent abdominal pain with nausea and vomiting. States she has had the pain x 2 months with multiple ED visits for same. States that the pain started again Friday and had IV fluids this past Sunday with no relief. Pale on arrival, elevated HR

## 2016-03-22 NOTE — Discharge Instructions (Signed)
Followup with your doctor and pain management doctor. Return to the ED if you develop new or worsening symptoms.

## 2016-03-22 NOTE — ED Notes (Signed)
Pt's Port Access covered in Lidocaine. Waiting for MD to assess

## 2016-03-22 NOTE — H&P (Signed)
History and Physical    ADMIRE BUNNELL TML:[id number] DOB: 1951-03-05 DOA: 03/22/2016  PCP: Ann Held, DO Consultants:  [facility] - GI;  Patient coming from: home - lives alone   Chief Complaint: abdominal pain  HPI: [name] is a 65 y.o. female  with medical history significant of DM, GERD, and stomach cancer in 2010 presening with abdominal pain.  Since our last visit (admitted 6/22) - negative EGD, suggested to have colonoscopy.  Waited for appt with Dr. Michail Sermon, saw him today but she had severe pain from a spasm that he sent her to the ER for evaluation and to r/o SBO.  He told her that she would get IVF and pain management.  She had blood drawn and then waited in triage and was brought back to room where she sat from 1300-1700 without an IV, no pain medication given.  After that, they came in and rushed her and "still came up with what?".  BP was dropping.  Symptoms: yearning to eat ham, eggs, cheese on toasted bread.  Eating it every day.  The next day, she is having n/v.  Feels like it comes "from the pit of my stomach all the way up."  Feels like it lays in her mid-epigastric region and she has a sour taste.  Has been placed on a number of medicines and uncertain whether she can take them together.  Now list is narrowed down to about 11 medicines and it isn't helping.     ED Course:  Per Dr. Wyvonnia Dusky: Care assumed from Dr. Kathrynn Humble.  CT scan shows no acute change. Stable postsurgical pathology. An initial elevated lactate has normalized.  Patient's abdomen is soft. She is tolerating by mouth. Patient is unable to ambulate without  Assistance  D/w Dr. Amedeo Plenty of [facility] who is not familiar with patient but will let Dr. Michail Sermon know CT results.  D/w Dr. Lorin Mercy who does not feel patient meets admission criteria.  Patient's family frustrated with ongoing pain without etiology and persistent nausea and vomiting. States patient has been losing weight and cannot  eat or drink at home. They are concerned about taking her home.  Advised we can ask hospitalist to evaluate for possible admission but admission cannot be promised. She needs to follow up with her pain management specialist and PCP. Dr. Lorin Mercy has seen and evaluated patient and discussed with patient and family. She has arranged for outpatient  gastric emptying study. case manager involved. Patient family satisfied with plan established. Dr. Lorin Mercy does not feel that patient needs admission.  Review of Systems: As per HPI; otherwise 10 point review of systems reviewed and negative.   Ambulatory Status:  ambulatory but weaker than before  Past Medical History:  Diagnosis Date  . Asthma   . Degenerative joint disease   . Diabetes mellitus   . GERD (gastroesophageal reflux disease)   . Hyperlipidemia    no longer on medication for this  . Hypertension    actually taking for DM renal protection, not DM  . Neuropathy ([place])    bilateral hands and feet  . Sickle cell trait ([place])   . Sleep apnea   . stomach ca dx'd 2010   chemo/xrt comp 05/2009    Past Surgical History:  Procedure Laterality Date  . APPENDECTOMY    . CESAREAN SECTION    . COLON SURGERY    . ESOPHAGOGASTRODUODENOSCOPY N/A 10/15/2012   Procedure: ESOPHAGOGASTRODUODENOSCOPY (EGD);  Surgeon: Loanne Drilling.  Michail Sermon, MD;  Location: [facility];  Service: Endoscopy;  Laterality: N/A;  . ESOPHAGOGASTRODUODENOSCOPY N/A 01/15/2016   Procedure: ESOPHAGOGASTRODUODENOSCOPY (EGD);  Surgeon: Ronald Lobo, MD;  Location: [facility];  Service: Endoscopy;  Laterality: N/A;  . HERNIA REPAIR    . SHOULDER SURGERY     Left  . TOTAL HIP ARTHROPLASTY     Right  . TOTAL KNEE ARTHROPLASTY     Left    Social History   Social History  . Marital status: Divorced    Spouse name: N/A  . Number of children: N/A  . Years of education: N/A   Occupational History  . Not on file.   Social History Main Topics  . Smoking status: Former  Smoker    Packs/day: 1.00    Years: 15.00    Types: Cigarettes    Start date: 09/27/1973    Quit date: 10/15/1988  . Smokeless tobacco: Never Used     Comment: quit smoking 14 years ago  . Alcohol use No  . Drug use: No  . Sexual activity: No   Other Topics Concern  . Not on file   Social History Narrative  . No narrative on file    Allergies  Allergen Reactions  . Adhesive [Tape] Other (See Comments)    Burn Skin  . Codeine Other (See Comments)    paranoid  . Zocor [Simvastatin - High Dose] Other (See Comments)    Muscle spasms  . Penicillins Rash    Has patient had a PCN reaction causing immediate rash, facial/tongue/throat swelling, SOB or lightheadedness with hypotension: Yes Has patient had a PCN reaction causing severe rash involving mucus membranes or skin necrosis: No Has patient had a PCN reaction that required hospitalization does not remember.  Has patient had a PCN reaction occurring within the last 10 years: No If all of the above answers are "NO", then may proceed with Cephalosporin use.     Family History  Problem Relation Age of Onset  . Diabetes Brother   . Alzheimer's disease Father 44  . Cancer Mother 43  . Diabetes Maternal Grandmother   . Cancer Maternal Grandfather     lung  . Suicidality Neg Hx   . Depression Neg Hx   . Dementia Neg Hx   . Anxiety disorder Neg Hx     Prior to Admission medications   Medication Sig Start Date End Date Taking? Authorizing Provider  acetic acid (VOSOL) 2 % otic solution Place 4 drops into both ears 3 (three) times daily. 03/04/16  Yes Yvonne R Lowne Chase, DO  aspirin 81 MG tablet Take 81 mg by mouth at bedtime.   Yes Historical Provider, MD  Blood Glucose Calibration (OT ULTRA/FASTTK CNTRL SOLN) SOLN  02/17/16  Yes Historical Provider, MD  Blood Glucose Monitoring Suppl (ONE TOUCH ULTRA 2) w/Device KIT  02/17/16  Yes Historical Provider, MD  Blood Pressure Monitoring (B-D ASSURE BPM/MANUAL ARM CUFF) MISC Check  blood pressure daily. 02/18/16  Yes Yvonne R Lowne Chase, DO  carvedilol (COREG) 3.125 MG tablet Take 1 tablet (3.125 mg total) by mouth 2 (two) times daily with a meal. 02/16/16  Yes Yvonne R Lowne Chase, DO  dicyclomine (BENTYL) 20 MG tablet Take 1 tablet (20 mg total) by mouth 4 (four) times daily -  before meals and at bedtime. 02/15/16  Yes Volanda Napoleon, MD  EMBEDA 50-2 MG CPCR Take 1 capsule by mouth daily. 02/11/16  Yes Historical Provider, MD  glimepiride (AMARYL) 4 MG  tablet Take 1 tablet (4 mg total) by mouth 2 (two) times daily. 02/19/16  Yes Yvonne R Lowne Chase, DO  HYDROcodone-acetaminophen (NORCO/VICODIN) 5-325 MG tablet Take 1 tablet by mouth every 6 (six) hours as needed. Patient taking differently: Take 1 tablet by mouth every 6 (six) hours as needed for moderate pain.  02/27/16  Yes Gloriann Loan, PA-C  metFORMIN (GLUCOPHAGE) 850 MG tablet Take 1 tablet (850 mg total) by mouth 2 (two) times daily. Repeat labs are due now 12/30/15  Yes Philemon Kingdom, MD  morphine (MSIR) 15 MG tablet Take 15 mg three times daily as needed for pain 12/18/15  Yes Historical Provider, MD  Multiple Vitamin (MULTIVITAMIN WITH MINERALS) TABS tablet Take 1 tablet by mouth daily.   Yes Historical Provider, MD  NONFORMULARY OR COMPOUNDED ITEM IVF NSS 1 Liter over 1 hour 03/18/16  Yes Yvonne R Lowne Chase, DO  ondansetron (ZOFRAN-ODT) 8 MG disintegrating tablet Take 1 tablet (8 mg total) by mouth every 8 (eight) hours as needed for nausea or vomiting. 02/25/16  Yes Yvonne R Lowne Chase, DO  ONE TOUCH ULTRA TEST test strip  02/17/16  Yes Historical Provider, MD  traMADol (ULTRAM) 50 MG tablet Take 1 tablet (50 mg total) by mouth every 6 (six) hours as needed for severe pain. 03/15/16  Yes Christopher Lawyer, PA-C  DULoxetine (CYMBALTA) 60 MG capsule Take 1 capsule (60 mg total) by mouth 2 (two) times daily. Patient not taking: Reported on 03/22/2016 06/09/15   Rosalita Chessman Chase, DO  lidocaine-prilocaine (EMLA) cream  Apply 1 application topically as needed. Patient not taking: Reported on 03/22/2016 06/29/15   Volanda Napoleon, MD  LORazepam (ATIVAN) 0.5 MG tablet Take 1 tablet (0.5 mg total) by mouth at bedtime as needed for anxiety. Patient not taking: Reported on 03/22/2016 08/21/15   Alferd Apa Lowne Chase, DO  metaxalone (SKELAXIN) 800 MG tablet Take 1 tablet (800 mg total) by mouth 4 (four) times daily. Patient not taking: Reported on 03/22/2016 04/06/15   Alferd Apa Lowne Chase, DO  pantoprazole (PROTONIX) 40 MG tablet Take 1 tablet (40 mg total) by mouth daily. Patient not taking: Reported on 03/22/2016 01/13/16   Virgel Manifold, MD    Physical Exam: Vitals:   03/22/16 2200 03/22/16 2215 03/22/16 2230 03/22/16 2307  BP: 110/77 112/69 118/77 121/76  Pulse: 88 88 85 87  Resp: 15 20 17 20   Temp:      TempSrc:      SpO2: 100% 100% 100% 100%     General: Appears calm and comfortable and is NAD Eyes:  PERRL, EOMI, normal lids, iris ENT:  grossly normal hearing, lips & tongue, mmm Neck:  no LAD, masses or thyromegaly Cardiovascular:  RRR, no m/r/g. No LE edema.  Respiratory:  CTA bilaterally, no w/r/r. Normal respiratory effort. Abdomen:  soft, diffusely tender, nd, NABS Skin:  no rash or induration seen on limited exam Musculoskeletal:  grossly normal tone BUE/BLE, good ROM, no bony abnormality Psychiatric:  grossly normal mood and affect, speech fluent and appropriate, AOx3 Neurologic:  CN 2-12 grossly intact, moves all extremities in coordinated fashion, sensation intact  Labs on Admission: I have personally reviewed following labs and imaging studies  CBC:  Recent Labs Lab 03/22/16 1217  WBC 8.3  HGB 13.6  HCT 40.0  MCV 88.7  PLT 409   Basic Metabolic Panel:  Recent Labs Lab 03/22/16 1217  NA 141  K 4.5  CL 108  CO2 20*  GLUCOSE 190*  BUN 15  CREATININE 1.65*  CALCIUM 8.7*   GFR: Estimated Creatinine Clearance: 40.6 mL/min (by C-G formula based on SCr of 1.65 mg/dL). Liver  Function Tests:  Recent Labs Lab 03/22/16 1217  AST 50*  ALT 36  ALKPHOS 91  BILITOT 1.2  PROT 6.7  ALBUMIN 2.8*    Recent Labs Lab 03/22/16 1217  LIPASE 12   No results for input(s): AMMONIA in the last 168 hours. Coagulation Profile: No results for input(s): INR, PROTIME in the last 168 hours. Cardiac Enzymes: No results for input(s): CKTOTAL, CKMB, CKMBINDEX, TROPONINI in the last 168 hours. BNP (last 3 results) No results for input(s): PROBNP in the last 8760 hours. HbA1C: No results for input(s): HGBA1C in the last 72 hours. CBG: No results for input(s): GLUCAP in the last 168 hours. Lipid Profile: No results for input(s): CHOL, HDL, LDLCALC, TRIG, CHOLHDL, LDLDIRECT in the last 72 hours. Thyroid Function Tests: No results for input(s): TSH, T4TOTAL, FREET4, T3FREE, THYROIDAB in the last 72 hours. Anemia Panel: No results for input(s): VITAMINB12, FOLATE, FERRITIN, TIBC, IRON, RETICCTPCT in the last 72 hours. Urine analysis:    Component Value Date/Time   COLORURINE AMBER (A) 03/22/2016 1829   APPEARANCEUR HAZY (A) 03/22/2016 1829   LABSPEC 1.015 03/22/2016 1829   LABSPEC 1.015 04/06/2015 0939   PHURINE 5.5 03/22/2016 1829   GLUCOSEU NEGATIVE 03/22/2016 1829   HGBUR NEGATIVE 03/22/2016 1829   BILIRUBINUR SMALL (A) 03/22/2016 1829   BILIRUBINUR 1+ 02/25/2016 1057   KETONESUR 15 (A) 03/22/2016 1829   PROTEINUR NEGATIVE 03/22/2016 1829   UROBILINOGEN 1.0 02/25/2016 1057   UROBILINOGEN 0.2 04/06/2015 0939   NITRITE NEGATIVE 03/22/2016 1829   LEUKOCYTESUR LARGE (A) 03/22/2016 1829    Creatinine Clearance: Estimated Creatinine Clearance: 40.6 mL/min (by C-G formula based on SCr of 1.65 mg/dL).  Sepsis Labs: @LABRCNTIP (procalcitonin:4,lacticidven:4) )No results found for this or any previous visit (from the past 240 hour(s)).   Radiological Exams on Admission: Ct Abdomen Pelvis Wo Contrast  Result Date: 03/22/2016 CLINICAL DATA:  Left-sided abdominal  pain. EXAM: CT ABDOMEN AND PELVIS WITHOUT CONTRAST TECHNIQUE: Multidetector CT imaging of the abdomen and pelvis was performed following the standard protocol without IV contrast. COMPARISON:  CT abdomen is dated 02/27/2016, 01/13/2016 and 01/04/2016. FINDINGS: Lower chest:  No acute findings. Hepatobiliary: Liver is diffusely low in density consistent with fatty infiltration. Gallbladder is unremarkable. Pancreas: Atrophic. Spleen: Within normal limits in size. Adrenals/Urinary Tract: Adrenal glands appear normal. Kidneys are unremarkable without mass, stone or hydronephrosis. No perinephric inflammation. No ureteral or bladder calculi identified. Bladder appears normal, although partially obscured by metallic artifact emanating from patient's right hip prosthesis. Stomach/Bowel: Surgical changes of partial gastrectomy and gastrojejunostomy. No obstruction, fluid collection or other surgical complicating feature in this area. No small or large bowel dilatation. No bowel wall thickening or evidence of bowel wall inflammation seen. Appendix is not seen, presumed appendectomy. Mild diverticulosis of the sigmoid colon without evidence of acute diverticulitis. Vascular/Lymphatic: Scattered atherosclerotic changes of the normal caliber abdominal aorta. No pathologic appearing lymph nodes. Reproductive: No mass or other significant abnormality. Other: No free fluid or abscess collection identified. No free intraperitoneal air. Musculoskeletal: Right hip arthroplasty hardware appears intact and appropriately positioned. Degenerative changes seen throughout the thoracolumbar spine, mild to moderate in degree. No acute or suspicious osseous finding. Superficial soft tissues are unremarkable. IMPRESSION: 1. No acute findings in the abdomen or pelvis. 2. Postsurgical changes of partial gastrectomy and gastrojejunostomy. No evidence of surgical complicating feature.  No bowel obstruction or evidence of bowel wall inflammation.  3. Severe hepatic steatosis, stable. 4. Mild colonic diverticulosis without evidence of acute diverticulitis. 5. Aortic atherosclerosis. 6. Additional chronic/incidental findings detailed above. Electronically Signed   By: Franki Cabot M.D.   On: 03/22/2016 18:20    EKG: Independently reviewed.  Sinus tachycardia with rate 114; no evidence of acute ischemia  Assessment/Plan Principal Problem:   Generalized abdominal pain Active Problems:   Gastrointestinal dysmotility   Weakness   Unexplained weight loss   Abdominal pain -Patient with recurrent ER visits and admission(s) for this issue -Negative evaluation to date with through work up -Has outpatient GI and plan for c-scope -Will order gastric emptying study as gastroparesis may be contributing -Encourage outpatient collaboration to try to minimize ER visits  DM -Poor control by last A1c and so uncontrolled DM may also be a source of abdominal pain.  -She is on several oral agents and may need consideration of Lantus based on poor control with current regimen.  Weakness -HHN and HHPT evaluations requested  Weight loss -F/u with PCP and Dr. Marin Olp. -Documented 40 pounds since March -May be related to gastroparesis  Okay for discharge to home with outpatient f/u    Karmen Bongo MD [facility]  If 7PM-7AM, please contact night-coverage [email or web address] Password [email or web address]  03/23/2016, 3:22 AM

## 2016-03-22 NOTE — ED Notes (Signed)
Pt was unable to ambulate independently. With the assist of one staff, pt was able to ambulate approx. 8 feet from bedside. Pt complained of lightheaded and feeling "tired". Pt was unable to ambulate back to the bedside, and needed to sit down. Pt was assisted back to bed with a wheelchair. Rn notified.

## 2016-03-22 NOTE — Care Management (Addendum)
ED CM and Dr. Inda Merlin  met with patient and daughter at bedside to discuss transitional care plan.  Explained patient not meeting guidelines for  Hospitalization, patient verbalized understanding. Discussed recommendations for home health services RN, PT, patient is agreeable with care transition plan. Offered patient choice, patient selected [facility]. CM verified contact information Referral faxed to [phone] received fax confirmation.  Explained to patient the [facility] nurse will contact her by phone 24-48 hours post discharge to arrange initial visit. Patient verbalized understanding and teach back done. Patient denies any further questions or concerns at this time. CM provided contact information should any further questions should arise. No further ED CM needs identified.

## 2016-03-22 NOTE — ED Provider Notes (Signed)
Care assumed from Dr. Kathrynn Humble.  CT scan shows no acute change. Stable postsurgical pathology. An initial elevated lactate has normalized.  Patient's abdomen is soft. She is tolerating by mouth. Patient is unable to ambulate without  Assistance  D/w Dr. Amedeo Plenty of [facility] who is not familiar with patient but will let Dr. Michail Sermon know CT results.  D/w Dr. Lorin Mercy who does not feel patient meets admission criteria.  Patient's family frustrated with ongoing pain without etiology and persistent nausea and vomiting. States patient has been losing weight and cannot eat or drink at home. They are concerned about taking her home.  Advised we can ask hospitalist to evaluate for possible admission but admission cannot be promised. She needs to follow up with her pain management specialist and PCP. Dr. Lorin Mercy has seen and evaluated patient and discussed with patient and family. She has arranged for outpatient  gastric emptying study. case manager involved. Patient family satisfied with plan established. Dr. Lorin Mercy does not feel that patient needs admission.  BP 121/76 (BP Location: Right Arm)   Pulse 87   Temp 98.3 F (36.8 C) (Oral)   Resp 20   SpO2 100%     Ezequiel Essex, MD 03/23/16 0008

## 2016-03-22 NOTE — ED Provider Notes (Signed)
[facility] DEPT Provider Note   CSN: [id number] Arrival date & time: 03/22/16  1207     History   Chief Complaint Chief Complaint  Patient presents with  . Abdominal Pain    HPI [name] is a 65 y.o. female.  HPI Pt is a 65 y/o female with a PMHx of DM, peripheral neuropathy, and stomach cancer in remission s/p resection who takes metformin, glipizide, and morphine and presents with several months of diffuse abdominal pain and new-onset left sided abdominal spasms. Pt reports no inciting or aggravating factors and only her pain medications as alleviating factors. She has nausea with daily vomiting (1-2x per day) associated with the pain. She has only had one bowel movement this week, down from a normal one every 2 days. She denies blood in her BM.   Pt had a CT scan done earlier in the month of the abd. GI f.u recommended. Pt reports that she was sent here by GI team. I called [facility], and it appears that Dr. Michail Sermon sent patient to the ER with concerns for possible SBO given the localized tenderness.   Past Medical History:  Diagnosis Date  . Asthma   . Degenerative joint disease   . Diabetes mellitus   . GERD (gastroesophageal reflux disease)   . Hyperlipidemia    no longer on medication for this  . Hypertension    actually taking for DM renal protection, not DM  . Neuropathy ([place])    bilateral hands and feet  . Sickle cell trait ([place])   . Sleep apnea   . stomach ca dx'd 2010   chemo/xrt comp 05/2009    Patient Active Problem List   Diagnosis Date Noted  . Hypotension 02/03/2016  . Syncope 02/03/2016  . Hypomagnesemia 02/03/2016  . AKI (acute kidney injury) ([place]) 02/03/2016  . Gastrointestinal dysmotility 01/20/2016  . CKD stage 2 due to type 2 diabetes mellitus ([place]) 01/20/2016  . Abscess of right thigh 01/20/2016  . Chronic pain syndrome 01/15/2016  . Diarrhea 01/15/2016  . Generalized abdominal pain 01/14/2016  . Uncontrolled type 2 diabetes  mellitus with diabetic polyneuropathy, without long-term current use of insulin ([place]) 12/30/2015  . Morbid obesity due to excess calories ([place]) 05/05/2015  . Diabetic polyneuropathy associated with type 2 diabetes mellitus ([place]) 01/02/2015  . Acute bacterial sinusitis 01/02/2015  . Onychomycosis 11/04/2014  . Pain in lower limb 11/04/2014  . Multinodular goiter (nontoxic) 10/16/2014  . CVA (cerebral infarction) 08/11/2014  . Iron deficiency anemia 05/20/2014  . Major depressive disorder, single episode, moderate ([place]) 05/20/2014  . Dizziness of unknown cause 04/28/2014  . MDD (major depressive disorder), recurrent episode, moderate ([place]) 01/28/2014  . GERD (gastroesophageal reflux disease) 01/02/2014  . Nausea 10/15/2012  . Stomach cancer ([place]) 12/10/2008  . CHRONIC RESPIRATORY FAILURE 01/03/2008  . Hyperlipidemia 06/27/2007  . OBESITY, MORBID 06/27/2007  . SLEEP APNEA, OBSTRUCTIVE 06/27/2007  . Essential hypertension 06/27/2007  . Asthma 06/27/2007  . OSTEOARTHRITIS  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 06/27/2007    Past Surgical History:  Procedure Laterality Date  . APPENDECTOMY    . CESAREAN SECTION    . COLON SURGERY    . ESOPHAGOGASTRODUODENOSCOPY N/A 10/15/2012   Procedure: ESOPHAGOGASTRODUODENOSCOPY (EGD);  Surgeon: Lear Ng, MD;  Location: [facility];  Service: Endoscopy;  Laterality: N/A;  . ESOPHAGOGASTRODUODENOSCOPY N/A 01/15/2016   Procedure: ESOPHAGOGASTRODUODENOSCOPY (EGD);  Surgeon: Ronald Lobo, MD;  Location: [facility];  Service: Endoscopy;  Laterality: N/A;  . HERNIA  REPAIR    . SHOULDER SURGERY     Left  . TOTAL HIP ARTHROPLASTY     Right  . TOTAL KNEE ARTHROPLASTY     Left    OB History    Gravida Para Term Preterm AB Living   4 4       4    SAB TAB Ectopic Multiple Live Births                   Home Medications    Prior to Admission medications   Medication Sig Start Date End Date Taking? Authorizing Provider  acetic acid (VOSOL) 2  % otic solution Place 4 drops into both ears 3 (three) times daily. 03/04/16  Yes Yvonne R Lowne Chase, DO  aspirin 81 MG tablet Take 81 mg by mouth at bedtime.   Yes Historical Provider, MD  Blood Glucose Calibration (OT ULTRA/FASTTK CNTRL SOLN) SOLN  02/17/16  Yes Historical Provider, MD  Blood Glucose Monitoring Suppl (ONE TOUCH ULTRA 2) w/Device KIT  02/17/16  Yes Historical Provider, MD  Blood Pressure Monitoring (B-D ASSURE BPM/MANUAL ARM CUFF) MISC Check blood pressure daily. 02/18/16  Yes Yvonne R Lowne Chase, DO  carvedilol (COREG) 3.125 MG tablet Take 1 tablet (3.125 mg total) by mouth 2 (two) times daily with a meal. 02/16/16  Yes Yvonne R Lowne Chase, DO  dicyclomine (BENTYL) 20 MG tablet Take 1 tablet (20 mg total) by mouth 4 (four) times daily -  before meals and at bedtime. 02/15/16  Yes Volanda Napoleon, MD  EMBEDA 50-2 MG CPCR Take 1 capsule by mouth daily. 02/11/16  Yes Historical Provider, MD  glimepiride (AMARYL) 4 MG tablet Take 1 tablet (4 mg total) by mouth 2 (two) times daily. 02/19/16  Yes Yvonne R Lowne Chase, DO  HYDROcodone-acetaminophen (NORCO/VICODIN) 5-325 MG tablet Take 1 tablet by mouth every 6 (six) hours as needed. Patient taking differently: Take 1 tablet by mouth every 6 (six) hours as needed for moderate pain.  02/27/16  Yes Gloriann Loan, PA-C  metFORMIN (GLUCOPHAGE) 850 MG tablet Take 1 tablet (850 mg total) by mouth 2 (two) times daily. Repeat labs are due now 12/30/15  Yes Philemon Kingdom, MD  morphine (MSIR) 15 MG tablet Take 15 mg three times daily as needed for pain 12/18/15  Yes Historical Provider, MD  Multiple Vitamin (MULTIVITAMIN WITH MINERALS) TABS tablet Take 1 tablet by mouth daily.   Yes Historical Provider, MD  NONFORMULARY OR COMPOUNDED ITEM IVF NSS 1 Liter over 1 hour 03/18/16  Yes Yvonne R Lowne Chase, DO  ondansetron (ZOFRAN-ODT) 8 MG disintegrating tablet Take 1 tablet (8 mg total) by mouth every 8 (eight) hours as needed for nausea or vomiting. 02/25/16  Yes  Yvonne R Lowne Chase, DO  ONE TOUCH ULTRA TEST test strip  02/17/16  Yes Historical Provider, MD  traMADol (ULTRAM) 50 MG tablet Take 1 tablet (50 mg total) by mouth every 6 (six) hours as needed for severe pain. 03/15/16  Yes Christopher Lawyer, PA-C  DULoxetine (CYMBALTA) 60 MG capsule Take 1 capsule (60 mg total) by mouth 2 (two) times daily. Patient not taking: Reported on 03/22/2016 06/09/15   Rosalita Chessman Chase, DO  lidocaine-prilocaine (EMLA) cream Apply 1 application topically as needed. Patient not taking: Reported on 03/22/2016 06/29/15   Volanda Napoleon, MD  LORazepam (ATIVAN) 0.5 MG tablet Take 1 tablet (0.5 mg total) by mouth at bedtime as needed for anxiety. Patient not taking: Reported on 03/22/2016 08/21/15  Alferd Apa Lowne Chase, DO  metaxalone (SKELAXIN) 800 MG tablet Take 1 tablet (800 mg total) by mouth 4 (four) times daily. Patient not taking: Reported on 03/22/2016 04/06/15   Alferd Apa Lowne Chase, DO  pantoprazole (PROTONIX) 40 MG tablet Take 1 tablet (40 mg total) by mouth daily. Patient not taking: Reported on 03/22/2016 01/13/16   Virgel Manifold, MD    Family History Family History  Problem Relation Age of Onset  . Diabetes Brother   . Alzheimer's disease Father 72  . Cancer Mother 76  . Diabetes Maternal Grandmother   . Cancer Maternal Grandfather     lung  . Suicidality Neg Hx   . Depression Neg Hx   . Dementia Neg Hx   . Anxiety disorder Neg Hx     Social History Social History  Substance Use Topics  . Smoking status: Former Smoker    Packs/day: 1.00    Years: 15.00    Types: Cigarettes    Start date: 09/27/1973    Quit date: 10/15/1988  . Smokeless tobacco: Never Used     Comment: quit smoking 14 years ago  . Alcohol use No     Allergies   Adhesive [tape]; Codeine; Zocor [simvastatin - high dose]; and Penicillins   Review of Systems Review of Systems  ROS 10 Systems reviewed and are negative for acute change except as noted in the  HPI.     Physical Exam Updated Vital Signs BP 100/74   Pulse 96   Temp 98.5 F (36.9 C) (Oral)   Resp 24   SpO2 100%   Physical Exam  Constitutional: She is oriented to person, place, and time. She appears well-developed.  HENT:  Head: Normocephalic and atraumatic.  Eyes: EOM are normal.  Neck: Normal range of motion. Neck supple.  Cardiovascular: Normal rate.   Pulmonary/Chest: Effort normal.  Abdominal: Soft. There is tenderness. There is guarding.  Neurological: She is alert and oriented to person, place, and time.  Skin: Skin is warm and dry.  Nursing note and vitals reviewed.    ED Treatments / Results  Labs (all labs ordered are listed, but only abnormal results are displayed) Labs Reviewed  COMPREHENSIVE METABOLIC PANEL - Abnormal; Notable for the following:       Result Value   CO2 20 (*)    Glucose, Bld 190 (*)    Creatinine, Ser 1.65 (*)    Calcium 8.7 (*)    Albumin 2.8 (*)    AST 50 (*)    GFR calc non Af Amer 32 (*)    GFR calc Af Amer 37 (*)    All other components within normal limits  I-STAT CG4 LACTIC ACID, ED - Abnormal; Notable for the following:    Lactic Acid, Venous 3.29 (*)    All other components within normal limits  LIPASE, BLOOD  CBC  URINALYSIS, ROUTINE W REFLEX MICROSCOPIC (NOT AT [facility])  I-STAT CG4 LACTIC ACID, ED    EKG  EKG Interpretation  Date/Time:  Tuesday March 22 2016 12:54:48 EDT Ventricular Rate:  114 PR Interval:    QRS Duration: 72 QT Interval:  302 QTC Calculation: 416 R Axis:   19 Text Interpretation:  Sinus tachycardia No acute changes Confirmed by Kathrynn Humble, MD, Thelma Comp ([id number]) on 03/22/2016 2:26:31 PM       Radiology No results found.  Procedures Procedures (including critical care time)  Medications Ordered in ED Medications - No data to display   Initial Impression / Assessment and  Plan / ED Course  I have reviewed the triage vital signs and the nursing notes.  Pertinent labs & imaging results  that were available during my care of the patient were reviewed by me and considered in my medical decision making (see chart for details).  Clinical Course   Pt comes in with cc of abd pain. Abd pain is chronic, she has L sided focal tenderness with guarding on exam. PT had a CT scan earlier in the month that showed some inflammation, with a broad differential. PT reports 60 lb weight loss. Seen by GI today, sent here for CT scan. Clinical concerns for L sided tenderness with change in BM - Ileus/SBO, tumor, diverticular dz. Spoke with GI team - and we discussed the recent CT scan results and the EGD  From June. I was informed that they would likely want CT scan now, and proceed with colonoscopy if the CT is neg. As an outpatient. Dr. Wyvonnia Dusky to f/u on results.   Final Clinical Impressions(s) / ED Diagnoses   Final diagnoses:  Left lower quadrant pain    New Prescriptions New Prescriptions   No medications on file     Varney Biles, MD 03/22/16 1630

## 2016-03-22 NOTE — ED Notes (Signed)
Dr. Wyvonnia Dusky notified on pt.'s family request to speak with him .

## 2016-03-22 NOTE — ED Notes (Signed)
RN case manager explained discharge plan to pt. and family .

## 2016-03-22 NOTE — ED Notes (Signed)
Medical Student at the bedside

## 2016-03-22 NOTE — ED Notes (Signed)
Admitting MD at bedside.

## 2016-03-23 ENCOUNTER — Telehealth: Payer: Self-pay | Admitting: *Deleted

## 2016-03-23 ENCOUNTER — Observation Stay (HOSPITAL_BASED_OUTPATIENT_CLINIC_OR_DEPARTMENT_OTHER)
Admission: EM | Admit: 2016-03-23 | Discharge: 2016-03-25 | Disposition: A | Discharge status: Home / Self Care | Payer: Medicare HMO | Attending: Internal Medicine | Admitting: Internal Medicine

## 2016-03-23 ENCOUNTER — Encounter (HOSPITAL_BASED_OUTPATIENT_CLINIC_OR_DEPARTMENT_OTHER): Payer: Self-pay | Admitting: Emergency Medicine

## 2016-03-23 DIAGNOSIS — R109 Unspecified abdominal pain: Secondary | ICD-10-CM | POA: Diagnosis not present

## 2016-03-23 DIAGNOSIS — Z87891 Personal history of nicotine dependence: Secondary | ICD-10-CM | POA: Diagnosis not present

## 2016-03-23 DIAGNOSIS — Z923 Personal history of irradiation: Secondary | ICD-10-CM | POA: Insufficient documentation

## 2016-03-23 DIAGNOSIS — D573 Sickle-cell trait: Secondary | ICD-10-CM | POA: Diagnosis not present

## 2016-03-23 DIAGNOSIS — N183 Chronic kidney disease, stage 3 unspecified: Secondary | ICD-10-CM | POA: Diagnosis present

## 2016-03-23 DIAGNOSIS — G40301 Generalized idiopathic epilepsy and epileptic syndromes, not intractable, with status epilepticus: Secondary | ICD-10-CM | POA: Diagnosis present

## 2016-03-23 DIAGNOSIS — K921 Melena: Secondary | ICD-10-CM | POA: Diagnosis present

## 2016-03-23 DIAGNOSIS — Z7984 Long term (current) use of oral hypoglycemic drugs: Secondary | ICD-10-CM | POA: Insufficient documentation

## 2016-03-23 DIAGNOSIS — I129 Hypertensive chronic kidney disease with stage 1 through stage 4 chronic kidney disease, or unspecified chronic kidney disease: Secondary | ICD-10-CM | POA: Diagnosis not present

## 2016-03-23 DIAGNOSIS — Z96652 Presence of left artificial knee joint: Secondary | ICD-10-CM | POA: Insufficient documentation

## 2016-03-23 DIAGNOSIS — Z85028 Personal history of other malignant neoplasm of stomach: Secondary | ICD-10-CM | POA: Diagnosis not present

## 2016-03-23 DIAGNOSIS — E1122 Type 2 diabetes mellitus with diabetic chronic kidney disease: Secondary | ICD-10-CM | POA: Diagnosis not present

## 2016-03-23 DIAGNOSIS — Z79899 Other long term (current) drug therapy: Secondary | ICD-10-CM | POA: Diagnosis not present

## 2016-03-23 DIAGNOSIS — R55 Syncope and collapse: Secondary | ICD-10-CM | POA: Diagnosis not present

## 2016-03-23 DIAGNOSIS — Z7982 Long term (current) use of aspirin: Secondary | ICD-10-CM | POA: Insufficient documentation

## 2016-03-23 DIAGNOSIS — Z79891 Long term (current) use of opiate analgesic: Secondary | ICD-10-CM | POA: Diagnosis not present

## 2016-03-23 DIAGNOSIS — Z96641 Presence of right artificial hip joint: Secondary | ICD-10-CM | POA: Insufficient documentation

## 2016-03-23 DIAGNOSIS — G894 Chronic pain syndrome: Secondary | ICD-10-CM | POA: Diagnosis not present

## 2016-03-23 DIAGNOSIS — R112 Nausea with vomiting, unspecified: Secondary | ICD-10-CM | POA: Insufficient documentation

## 2016-03-23 DIAGNOSIS — Z9049 Acquired absence of other specified parts of digestive tract: Secondary | ICD-10-CM | POA: Insufficient documentation

## 2016-03-23 DIAGNOSIS — F329 Major depressive disorder, single episode, unspecified: Secondary | ICD-10-CM | POA: Insufficient documentation

## 2016-03-23 DIAGNOSIS — Z6833 Body mass index (BMI) 33.0-33.9, adult: Secondary | ICD-10-CM | POA: Insufficient documentation

## 2016-03-23 DIAGNOSIS — G4733 Obstructive sleep apnea (adult) (pediatric): Secondary | ICD-10-CM | POA: Diagnosis not present

## 2016-03-23 DIAGNOSIS — I10 Essential (primary) hypertension: Secondary | ICD-10-CM

## 2016-03-23 DIAGNOSIS — E1142 Type 2 diabetes mellitus with diabetic polyneuropathy: Secondary | ICD-10-CM | POA: Insufficient documentation

## 2016-03-23 DIAGNOSIS — R1110 Vomiting, unspecified: Secondary | ICD-10-CM

## 2016-03-23 DIAGNOSIS — E1121 Type 2 diabetes mellitus with diabetic nephropathy: Secondary | ICD-10-CM | POA: Diagnosis present

## 2016-03-23 DIAGNOSIS — Z9221 Personal history of antineoplastic chemotherapy: Secondary | ICD-10-CM | POA: Insufficient documentation

## 2016-03-23 DIAGNOSIS — R1010 Upper abdominal pain, unspecified: Secondary | ICD-10-CM

## 2016-03-23 DIAGNOSIS — K760 Fatty (change of) liver, not elsewhere classified: Secondary | ICD-10-CM | POA: Insufficient documentation

## 2016-03-23 DIAGNOSIS — R531 Weakness: Secondary | ICD-10-CM

## 2016-03-23 DIAGNOSIS — K922 Gastrointestinal hemorrhage, unspecified: Secondary | ICD-10-CM

## 2016-03-23 DIAGNOSIS — Z903 Acquired absence of stomach [part of]: Secondary | ICD-10-CM | POA: Diagnosis not present

## 2016-03-23 DIAGNOSIS — R634 Abnormal weight loss: Secondary | ICD-10-CM | POA: Diagnosis present

## 2016-03-23 LAB — COMPREHENSIVE METABOLIC PANEL
ALT: 40 U/L (ref 14–54)
AST: 79 U/L — AB (ref 15–41)
Albumin: 2.7 g/dL — ABNORMAL LOW (ref 3.5–5.0)
Alkaline Phosphatase: 68 U/L (ref 38–126)
Anion gap: 11 (ref 5–15)
BILIRUBIN TOTAL: 1 mg/dL (ref 0.3–1.2)
BUN: 11 mg/dL (ref 6–20)
CO2: 21 mmol/L — ABNORMAL LOW (ref 22–32)
CREATININE: 1.5 mg/dL — AB (ref 0.44–1.00)
Calcium: 8.6 mg/dL — ABNORMAL LOW (ref 8.9–10.3)
Chloride: 110 mmol/L (ref 101–111)
GFR, EST AFRICAN AMERICAN: 41 mL/min — AB (ref >60)
GFR, EST NON AFRICAN AMERICAN: 36 mL/min — AB (ref >60)
Glucose, Bld: 127 mg/dL — ABNORMAL HIGH (ref 65–99)
Potassium: 3.8 mmol/L (ref 3.5–5.1)
Sodium: 142 mmol/L (ref 135–145)
TOTAL PROTEIN: 6.5 g/dL (ref 6.5–8.1)

## 2016-03-23 LAB — CBC
HEMATOCRIT: 36 % (ref 36.0–46.0)
Hemoglobin: 13.2 g/dL (ref 12.0–15.0)
MCH: 30.8 pg (ref 26.0–34.0)
MCHC: 36.7 g/dL — ABNORMAL HIGH (ref 30.0–36.0)
MCV: 84.1 fL (ref 78.0–100.0)
PLATELETS: 309 10*3/uL (ref 150–400)
RBC: 4.28 MIL/uL (ref 3.87–5.11)
RDW: 15.2 % (ref 11.5–15.5)
WBC: 6.7 10*3/uL (ref 4.0–10.5)

## 2016-03-23 LAB — OCCULT BLOOD X 1 CARD TO LAB, STOOL: Fecal Occult Bld: POSITIVE — AB

## 2016-03-23 MED ORDER — SODIUM CHLORIDE 0.9 % IV BOLUS (SEPSIS)
1000.0000 mL | Freq: Once | INTRAVENOUS | Status: AC
  Administered 2016-03-23: 1000 mL via INTRAVENOUS

## 2016-03-23 MED ORDER — SODIUM CHLORIDE 0.9 % IV BOLUS (SEPSIS): 1000.0000 mL | Freq: Once | INTRAVENOUS | Status: DC

## 2016-03-23 MED ORDER — MORPHINE SULFATE (PF) 4 MG/ML IV SOLN
INTRAVENOUS | Status: AC
  Administered 2016-03-23: 22:00:00
  Filled 2016-03-23: qty 1

## 2016-03-23 NOTE — Telephone Encounter (Signed)
[name] of [facility] called to say that [facility] referral was faxed last night but [facility] does not accept pt insurance at this time.  EDCM contacted pt to offer choice.  Pt stated she wanted "whoever insurance will pay for."  [facility] informed her that [facility] is paid for by her insurance and she accepted.  EDCM contacted [name] to start [facility] services ASAP.

## 2016-03-23 NOTE — ED Triage Notes (Signed)
States has been having a GI problem x 2 months  Was treated at [facility] ER yesterday.  Continues to have diarrhea today and feels weak

## 2016-03-23 NOTE — ED Provider Notes (Signed)
[facility] Provider Note   CSN: [id number] Arrival date & time: 03/23/16  1830  By signing my name below, I, [name], attest that this documentation has been prepared under the direction and in the presence of Shary Decamp, PA-C. Electronically signed by: [name], ED Scribe. 03/23/16. 7:22 PM.   History   Chief Complaint No chief complaint on file.  The history is provided by the patient and a relative. No language interpreter was used.   HPI Comments:  [name] is a 65 y.o. female with a history of stomach cancer and DM who presents to the Emergency Department complaining of chronic left-sided abdominal pain, which started two months ago. Pt reports that she was seen at GI yesterday and was sent to [facility] due to level of pain. She has been seen in ED 3 times his month. Pt had a CT scan earlier in the month that showed some inflammation, with a broad differential. CT scan yesterday showed stable hepatic steatosis and mild clonic diverticulosis w/o evidence of acute diverticulitis; no other acute findings. Pt contacted GI today due to "black" stools and was prompted to return to ED. Associated symptoms include diarrhea, nausea, decreased appetite, frequent BM, chills, fatigue and malaise. Denies fever.  Pt also reports that she injured shoulder status post fall, which occurred prior to arrival.     Past Medical History:  Diagnosis Date  . Asthma   . Degenerative joint disease   . Diabetes mellitus   . GERD (gastroesophageal reflux disease)   . Hyperlipidemia    no longer on medication for this  . Hypertension    actually taking for DM renal protection, not DM  . Neuropathy ([place])    bilateral hands and feet  . Sickle cell trait ([place])   . Sleep apnea   . stomach ca dx'd 2010   chemo/xrt comp 05/2009    Patient Active Problem List   Diagnosis Date Noted  . Weakness 03/23/2016  . Unexplained weight loss 03/23/2016  . Hypotension 02/03/2016  . Syncope  02/03/2016  . Hypomagnesemia 02/03/2016  . AKI (acute kidney injury) ([place]) 02/03/2016  . Gastrointestinal dysmotility 01/20/2016  . CKD stage 2 due to type 2 diabetes mellitus ([place]) 01/20/2016  . Abscess of right thigh 01/20/2016  . Chronic pain syndrome 01/15/2016  . Diarrhea 01/15/2016  . Generalized abdominal pain 01/14/2016  . Uncontrolled type 2 diabetes mellitus with diabetic polyneuropathy, without long-term current use of insulin ([place]) 12/30/2015  . Morbid obesity due to excess calories ([place]) 05/05/2015  . Diabetic polyneuropathy associated with type 2 diabetes mellitus ([place]) 01/02/2015  . Acute bacterial sinusitis 01/02/2015  . Onychomycosis 11/04/2014  . Pain in lower limb 11/04/2014  . Multinodular goiter (nontoxic) 10/16/2014  . CVA (cerebral infarction) 08/11/2014  . Iron deficiency anemia 05/20/2014  . Major depressive disorder, single episode, moderate ([place]) 05/20/2014  . Dizziness of unknown cause 04/28/2014  . MDD (major depressive disorder), recurrent episode, moderate ([place]) 01/28/2014  . GERD (gastroesophageal reflux disease) 01/02/2014  . Nausea 10/15/2012  . Stomach cancer ([place]) 12/10/2008  . CHRONIC RESPIRATORY FAILURE 01/03/2008  . Hyperlipidemia 06/27/2007  . OBESITY, MORBID 06/27/2007  . SLEEP APNEA, OBSTRUCTIVE 06/27/2007  . Essential hypertension 06/27/2007  . Asthma 06/27/2007  . OSTEOARTHRITIS  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 06/27/2007    Past Surgical History:  Procedure Laterality Date  . APPENDECTOMY    . CESAREAN SECTION    . COLON SURGERY    .  ESOPHAGOGASTRODUODENOSCOPY N/A 10/15/2012   Procedure: ESOPHAGOGASTRODUODENOSCOPY (EGD);  Surgeon: Lear Ng, MD;  Location: [facility];  Service: Endoscopy;  Laterality: N/A;  . ESOPHAGOGASTRODUODENOSCOPY N/A 01/15/2016   Procedure: ESOPHAGOGASTRODUODENOSCOPY (EGD);  Surgeon: Ronald Lobo, MD;  Location: [facility];  Service: Endoscopy;  Laterality: N/A;  . HERNIA REPAIR    . SHOULDER  SURGERY     Left  . TOTAL HIP ARTHROPLASTY     Right  . TOTAL KNEE ARTHROPLASTY     Left    OB History    Gravida Para Term Preterm AB Living   _0 SAB TAB Ectopic Multiple Live Births                   Home Medications    Prior to Admission medications   Medication Sig Start Date End Date Taking? Authorizing Provider  acetic acid (VOSOL) 2 % otic solution Place 4 drops into both ears 3 (three) times daily. 03/04/16   Rosalita Chessman Chase, DO  aspirin 81 MG tablet Take 81 mg by mouth at bedtime.    Historical Provider, MD  Blood Glucose Calibration (OT ULTRA/FASTTK CNTRL SOLN) SOLN  02/17/16   Historical Provider, MD  Blood Glucose Monitoring Suppl (ONE TOUCH ULTRA 2) w/Device KIT  02/17/16   Historical Provider, MD  Blood Pressure Monitoring (B-D ASSURE BPM/MANUAL ARM CUFF) MISC Check blood pressure daily. 02/18/16   Rosalita Chessman Chase, DO  carvedilol (COREG) 3.125 MG tablet Take 1 tablet (3.125 mg total) by mouth 2 (two) times daily with a meal. 02/16/16   Rosalita Chessman Chase, DO  dicyclomine (BENTYL) 20 MG tablet Take 1 tablet (20 mg total) by mouth 4 (four) times daily -  before meals and at bedtime. 02/15/16   Volanda Napoleon, MD  DULoxetine (CYMBALTA) 60 MG capsule Take 1 capsule (60 mg total) by mouth 2 (two) times daily. Patient not taking: Reported on 03/22/2016 06/09/15   Alferd Apa Lowne Chase, DO  EMBEDA 50-2 MG CPCR Take 1 capsule by mouth daily. 02/11/16   Historical Provider, MD  glimepiride (AMARYL) 4 MG tablet Take 1 tablet (4 mg total) by mouth 2 (two) times daily. 02/19/16   Rosalita Chessman Chase, DO  HYDROcodone-acetaminophen (NORCO/VICODIN) 5-325 MG tablet Take 1 tablet by mouth every 6 (six) hours as needed. Patient taking differently: Take 1 tablet by mouth every 6 (six) hours as needed for moderate pain.  02/27/16   Gloriann Loan, PA-C  lidocaine-prilocaine (EMLA) cream Apply 1 application topically as needed. Patient not taking: Reported on 03/22/2016 06/29/15    Volanda Napoleon, MD  LORazepam (ATIVAN) 0.5 MG tablet Take 1 tablet (0.5 mg total) by mouth at bedtime as needed for anxiety. Patient not taking: Reported on 03/22/2016 08/21/15   Alferd Apa Lowne Chase, DO  metaxalone (SKELAXIN) 800 MG tablet Take 1 tablet (800 mg total) by mouth 4 (four) times daily. Patient not taking: Reported on 03/22/2016 04/06/15   Rosalita Chessman Chase, DO  metFORMIN (GLUCOPHAGE) 850 MG tablet Take 1 tablet (850 mg total) by mouth 2 (two) times daily. Repeat labs are due now 12/30/15   Philemon Kingdom, MD  morphine (MSIR) 15 MG tablet Take 15 mg three times daily as needed for pain 12/18/15   Historical Provider, MD  Multiple Vitamin (MULTIVITAMIN WITH MINERALS) TABS tablet Take 1 tablet by mouth daily.    Historical Provider, MD  NONFORMULARY OR COMPOUNDED ITEM IVF  NSS 1 Liter over 1 hour 03/18/16   Rosalita Chessman Chase, DO  ondansetron (ZOFRAN-ODT) 8 MG disintegrating tablet Take 1 tablet (8 mg total) by mouth every 8 (eight) hours as needed for nausea or vomiting. 02/25/16   Rosalita Chessman Chase, DO  ONE TOUCH ULTRA TEST test strip  02/17/16   Historical Provider, MD  pantoprazole (PROTONIX) 40 MG tablet Take 1 tablet (40 mg total) by mouth daily. Patient not taking: Reported on 03/22/2016 01/13/16   Virgel Manifold, MD  traMADol (ULTRAM) 50 MG tablet Take 1 tablet (50 mg total) by mouth every 6 (six) hours as needed for severe pain. 03/15/16   Dalia Heading, PA-C    Family History Family History  Problem Relation Age of Onset  . Diabetes Brother   . Alzheimer's disease Father 54  . Cancer Mother 4  . Diabetes Maternal Grandmother   . Cancer Maternal Grandfather     lung  . Suicidality Neg Hx   . Depression Neg Hx   . Dementia Neg Hx   . Anxiety disorder Neg Hx     Social History Social History  Substance Use Topics  . Smoking status: Former Smoker    Packs/day: 1.00    Years: 15.00    Types: Cigarettes    Start date: 09/27/1973    Quit date: 10/15/1988  .  Smokeless tobacco: Never Used     Comment: quit smoking 14 years ago  . Alcohol use No     Allergies   Adhesive [tape]; Codeine; Zocor [simvastatin - high dose]; and Penicillins   Review of Systems Review of Systems  Constitutional: Positive for appetite change, chills and fatigue. Negative for fever.  Gastrointestinal: Positive for abdominal pain, blood in stool and nausea.  Musculoskeletal: Positive for arthralgias ( shoulder pain).  All other systems reviewed and are negative.    Physical Exam Updated Vital Signs BP 114/76 (BP Location: Left Arm)   Pulse 106   Temp 99.8 F (37.7 C) (Oral)   Resp 20   Ht 5' 7" (1.702 m)   Wt 210 lb (95.3 kg)   SpO2 100%   BMI 32.89 kg/m   Physical Exam  Constitutional: She is oriented to person, place, and time. Vital signs are normal. She appears well-developed and well-nourished.  HENT:  Head: Normocephalic and atraumatic.  Right Ear: Hearing normal.  Left Ear: Hearing normal.  Mouth/Throat: Oropharynx is clear and moist.  Eyes: Conjunctivae and EOM are normal. Pupils are equal, round, and reactive to light.  Neck: Normal range of motion.  Cardiovascular: Normal rate, regular rhythm, normal heart sounds and intact distal pulses.   Pulmonary/Chest: Effort normal and breath sounds normal.  Abdominal: Soft. Normal appearance and bowel sounds are normal. There is tenderness in the epigastric area and left upper quadrant.  Musculoskeletal: Normal range of motion.       Cervical back: Normal.       Thoracic back: Normal.       Lumbar back: Normal.  Neurological: She is alert and oriented to person, place, and time. She has normal strength. No cranial nerve deficit or sensory deficit.  Cranial Nerves:  II: Pupils equal, round, reactive to light III,IV, VI: ptosis not present, extra-ocular motions intact bilaterally  V,VII: smile symmetric, facial light touch sensation equal VIII: hearing grossly normal bilaterally  IX,X: midline  uvula rise  XI: bilateral shoulder shrug equal and strong XII: midline tongue extension  Skin: Skin is warm and dry.  Psychiatric: She has  a normal mood and affect. Her speech is normal and behavior is normal. Thought content normal.  Nursing note and vitals reviewed.  ED Treatments / Results  DIAGNOSTIC STUDIES:  Oxygen Saturation is 100% on room air, normal by my interpretation.    COORDINATION OF CARE:  7:00 PM Discussed treatment plan with pt at bedside and pt agreed to plan.  Labs (all labs ordered are listed, but only abnormal results are displayed) Labs Reviewed  OCCULT BLOOD X 1 CARD TO LAB, STOOL - Abnormal; Notable for the following:       Result Value   Fecal Occult Bld POSITIVE (*)    All other components within normal limits  CBC - Abnormal; Notable for the following:    MCHC 36.7 (*)    All other components within normal limits  COMPREHENSIVE METABOLIC PANEL - Abnormal; Notable for the following:    CO2 21 (*)    Glucose, Bld 127 (*)    Creatinine, Ser 1.50 (*)    Calcium 8.6 (*)    Albumin 2.7 (*)    AST 79 (*)    GFR calc non Af Amer 36 (*)    GFR calc Af Amer 41 (*)    All other components within normal limits    EKG  EKG Interpretation None       Radiology Ct Abdomen Pelvis Wo Contrast  Result Date: 03/22/2016 CLINICAL DATA:  Left-sided abdominal pain. EXAM: CT ABDOMEN AND PELVIS WITHOUT CONTRAST TECHNIQUE: Multidetector CT imaging of the abdomen and pelvis was performed following the standard protocol without IV contrast. COMPARISON:  CT abdomen is dated 02/27/2016, 01/13/2016 and 01/04/2016. FINDINGS: Lower chest:  No acute findings. Hepatobiliary: Liver is diffusely low in density consistent with fatty infiltration. Gallbladder is unremarkable. Pancreas: Atrophic. Spleen: Within normal limits in size. Adrenals/Urinary Tract: Adrenal glands appear normal. Kidneys are unremarkable without mass, stone or hydronephrosis. No perinephric inflammation. No  ureteral or bladder calculi identified. Bladder appears normal, although partially obscured by metallic artifact emanating from patient's right hip prosthesis. Stomach/Bowel: Surgical changes of partial gastrectomy and gastrojejunostomy. No obstruction, fluid collection or other surgical complicating feature in this area. No small or large bowel dilatation. No bowel wall thickening or evidence of bowel wall inflammation seen. Appendix is not seen, presumed appendectomy. Mild diverticulosis of the sigmoid colon without evidence of acute diverticulitis. Vascular/Lymphatic: Scattered atherosclerotic changes of the normal caliber abdominal aorta. No pathologic appearing lymph nodes. Reproductive: No mass or other significant abnormality. Other: No free fluid or abscess collection identified. No free intraperitoneal air. Musculoskeletal: Right hip arthroplasty hardware appears intact and appropriately positioned. Degenerative changes seen throughout the thoracolumbar spine, mild to moderate in degree. No acute or suspicious osseous finding. Superficial soft tissues are unremarkable. IMPRESSION: 1. No acute findings in the abdomen or pelvis. 2. Postsurgical changes of partial gastrectomy and gastrojejunostomy. No evidence of surgical complicating feature. No bowel obstruction or evidence of bowel wall inflammation. 3. Severe hepatic steatosis, stable. 4. Mild colonic diverticulosis without evidence of acute diverticulitis. 5. Aortic atherosclerosis. 6. Additional chronic/incidental findings detailed above. Electronically Signed   By: Franki Cabot M.D.   On: 03/22/2016 18:20    Procedures Procedures (including critical care time)  Medications Ordered in ED Medications - No data to display   Initial Impression / Assessment and Plan / ED Course  I have reviewed the triage vital signs and the nursing notes.  Pertinent labs & imaging results that were available during my care of the patient were reviewed  by me  and considered in my medical decision making (see chart for details).  Clinical Course    Final Clinical Impressions(s) / ED Diagnoses  I have reviewed and evaluated the relevant laboratory valuesI have reviewed and evaluated the relevant imaging studies. I have reviewed the relevant previous healthcare records.I obtained HPI from historian. Patient discussed with supervising physician  ED Course:  Assessment: Pt is a 83yF with hx stomach cancer and DM  who presents with LUQ abdominal pain x 2 months. Pt also presents with concern due to black stool. CAlled GI office and told to come to ED for eval.. Seen in [facility] yesterday for same. Unremarkable work up. Neg CT. No indications for admission. Presents today due to continued symptoms. Fell in grass at home. No head trauma. No LOC. Pt not on blood thinners. Pt does not use cane/walker/wheelchair for ambulation. Notes increase weakness due to decrease PO intake for the past few days. Has gastric emptying study planned with Dr. Michail Sermon (GI). On exam, pt in NAD. Nontoxic/nonseptic appearing. VSS. Afebrile. Lungs CTA. Heart RRR. Abdomen TTP LUQ. CN evaluated and unremarakble. CBC/CMP unremarkable. No drop in Hgb. POC Guiaic positive. No gross hematochezia on exam. Consult to GI (Dr. Alessandra Bevels) will admit to medicine and GI will see in AM. Plan is to [facility]. GI will see in AM.     Disposition/Plan:  Admit Pt acknowledges and agrees with plan  Supervising Physician Sherwood Gambler, MD   Final diagnoses:  Gastrointestinal hemorrhage, unspecified gastritis, unspecified gastrointestinal hemorrhage type  Abdominal pain, unspecified abdominal location    New Prescriptions New Prescriptions   No medications on file    I personally performed the services described in this documentation, which was scribed in my presence. The recorded information has been reviewed and is accurate.     Shary Decamp, PA-C 03/23/16 [id number]    Sherwood Gambler, MD 03/26/16  [phone]

## 2016-03-23 NOTE — Progress Notes (Signed)
65 yo F hx of stomach CA in remission, DM who presents again with abdominal pain, N/V.  Per notes, work-up by [facility] GI as outpatient has been in process, but was sent to ER from clinic yesterday because of abdominal pain, concern for SBO. There was none by CT, she was discharged to home with plans for gastric emptying study to eval for gastroparesis.    Today, she had "black stool". Called GI office, who rec'd she be evaluated in ER. BP 114/76 (BP Location: Left Arm)   Pulse 106   Temp 99.8 F (37.7 C) (Oral)   Resp 20   Ht 5' 7"  (1.702 m)   Wt 95.3 kg (210 lb)   SpO2 100%   BMI 32.89 kg/m   WBC normal, Cr 1.5 (baseline), sodium and potassium normal.  On rectal exam, no melena or blood, but FOBT+.  Was discussed with Dr. Alessandra Bevels from [facility] who rec'd observation for expedited GI eval.  No PPI recommended, no imaging recommended.  No endoscopy (upper or lower) is planned, so no need to hold diet per EDP.  To med surg, OBS status.

## 2016-03-23 NOTE — ED Notes (Signed)
[name] [phone] daughter request notified with disposition or status change.

## 2016-03-24 ENCOUNTER — Encounter (HOSPITAL_COMMUNITY): Payer: Self-pay | Admitting: Family Medicine

## 2016-03-24 ENCOUNTER — Telehealth: Payer: Self-pay | Admitting: Family Medicine

## 2016-03-24 DIAGNOSIS — I10 Essential (primary) hypertension: Secondary | ICD-10-CM

## 2016-03-24 DIAGNOSIS — K760 Fatty (change of) liver, not elsewhere classified: Secondary | ICD-10-CM

## 2016-03-24 DIAGNOSIS — G894 Chronic pain syndrome: Secondary | ICD-10-CM

## 2016-03-24 DIAGNOSIS — E1165 Type 2 diabetes mellitus with hyperglycemia: Secondary | ICD-10-CM

## 2016-03-24 DIAGNOSIS — E1142 Type 2 diabetes mellitus with diabetic polyneuropathy: Secondary | ICD-10-CM

## 2016-03-24 DIAGNOSIS — R55 Syncope and collapse: Secondary | ICD-10-CM

## 2016-03-24 DIAGNOSIS — N183 Chronic kidney disease, stage 3 (moderate): Secondary | ICD-10-CM

## 2016-03-24 LAB — COMPREHENSIVE METABOLIC PANEL
ALT: 34 U/L (ref 14–54)
ANION GAP: 7 (ref 5–15)
AST: 68 U/L — ABNORMAL HIGH (ref 15–41)
Albumin: 2.4 g/dL — ABNORMAL LOW (ref 3.5–5.0)
Alkaline Phosphatase: 62 U/L (ref 38–126)
BUN: 10 mg/dL (ref 6–20)
CHLORIDE: 115 mmol/L — AB (ref 101–111)
CO2: 23 mmol/L (ref 22–32)
CREATININE: 1.38 mg/dL — AB (ref 0.44–1.00)
Calcium: 8.3 mg/dL — ABNORMAL LOW (ref 8.9–10.3)
GFR, EST AFRICAN AMERICAN: 46 mL/min — AB (ref >60)
GFR, EST NON AFRICAN AMERICAN: 39 mL/min — AB (ref >60)
Glucose, Bld: 94 mg/dL (ref 65–99)
POTASSIUM: 3.7 mmol/L (ref 3.5–5.1)
SODIUM: 145 mmol/L (ref 135–145)
Total Bilirubin: 1 mg/dL (ref 0.3–1.2)
Total Protein: 5.7 g/dL — ABNORMAL LOW (ref 6.5–8.1)

## 2016-03-24 LAB — CBC
HCT: 33 % — ABNORMAL LOW (ref 36.0–46.0)
Hemoglobin: 11.6 g/dL — ABNORMAL LOW (ref 12.0–15.0)
MCH: 30.2 pg (ref 26.0–34.0)
MCHC: 35.2 g/dL (ref 30.0–36.0)
MCV: 85.9 fL (ref 78.0–100.0)
PLATELETS: 290 10*3/uL (ref 150–400)
RBC: 3.84 MIL/uL — ABNORMAL LOW (ref 3.87–5.11)
RDW: 15.3 % (ref 11.5–15.5)
WBC: 8.6 10*3/uL (ref 4.0–10.5)

## 2016-03-24 LAB — GLUCOSE, CAPILLARY
GLUCOSE-CAPILLARY: 102 mg/dL — AB (ref 65–99)
GLUCOSE-CAPILLARY: 171 mg/dL — AB (ref 65–99)
GLUCOSE-CAPILLARY: 183 mg/dL — AB (ref 65–99)
GLUCOSE-CAPILLARY: 188 mg/dL — AB (ref 65–99)
GLUCOSE-CAPILLARY: 81 mg/dL (ref 65–99)
GLUCOSE-CAPILLARY: 87 mg/dL (ref 65–99)

## 2016-03-24 MED ORDER — MORPHINE SULFATE (PF) 4 MG/ML IV SOLN
4.0000 mg | Freq: Once | INTRAVENOUS | Status: DC
  Administered 2016-03-24: 4 mg via INTRAVENOUS
  Filled 2016-03-24: qty 1

## 2016-03-24 MED ORDER — INSULIN ASPART 100 UNIT/ML ~~LOC~~ SOLN
0.0000 [IU] | SUBCUTANEOUS | Status: DC
  Administered 2016-03-24 – 2016-03-25 (×3): 3 [IU] via SUBCUTANEOUS

## 2016-03-24 MED ORDER — ENOXAPARIN SODIUM 40 MG/0.4ML ~~LOC~~ SOLN: 40.0000 mg | SUBCUTANEOUS | Status: DC

## 2016-03-24 MED ORDER — METOCLOPRAMIDE HCL 10 MG PO TABS
10.0000 mg | ORAL_TABLET | Freq: Three times a day (TID) | ORAL | Status: DC
  Administered 2016-03-24 – 2016-03-25 (×5): 10 mg via ORAL
  Filled 2016-03-24 (×5): qty 1

## 2016-03-24 MED ORDER — ACETAMINOPHEN 325 MG PO TABS: 650.0000 mg | ORAL_TABLET | Freq: Four times a day (QID) | ORAL | Status: DC | PRN

## 2016-03-24 MED ORDER — CARVEDILOL 3.125 MG PO TABS
3.1250 mg | ORAL_TABLET | Freq: Two times a day (BID) | ORAL | Status: DC
  Administered 2016-03-24 (×2): 3.125 mg via ORAL
  Filled 2016-03-24 (×2): qty 1

## 2016-03-24 MED ORDER — DICYCLOMINE HCL 20 MG PO TABS
20.0000 mg | ORAL_TABLET | Freq: Three times a day (TID) | ORAL | Status: DC
  Administered 2016-03-24 (×3): 20 mg via ORAL
  Filled 2016-03-24 (×7): qty 1

## 2016-03-24 MED ORDER — SODIUM CHLORIDE 0.9 % IV SOLN
INTRAVENOUS | Status: DC
  Administered 2016-03-24: 125 mL/h via INTRAVENOUS

## 2016-03-24 MED ORDER — MORPHINE-NALTREXONE 50-2 MG PO CPCR: 1.0000 | ORAL_CAPSULE | Freq: Every day | ORAL | Status: DC

## 2016-03-24 MED ORDER — MORPHINE SULFATE ER 30 MG PO TBCR: 30.0000 mg | EXTENDED_RELEASE_TABLET | Freq: Every day | ORAL | Status: DC

## 2016-03-24 MED ORDER — ASPIRIN EC 81 MG PO TBEC
81.0000 mg | DELAYED_RELEASE_TABLET | Freq: Every day | ORAL | Status: DC
  Administered 2016-03-24: 81 mg via ORAL
  Filled 2016-03-24: qty 1

## 2016-03-24 MED ORDER — MORPHINE SULFATE ER 15 MG PO TBCR: 15.0000 mg | EXTENDED_RELEASE_TABLET | Freq: Every day | ORAL | Status: DC

## 2016-03-24 MED ORDER — MORPHINE SULFATE 15 MG PO TABS
15.0000 mg | ORAL_TABLET | Freq: Three times a day (TID) | ORAL | Status: DC | PRN
Start: 2016-03-24 — End: 2016-03-25
  Administered 2016-03-24: 15 mg via ORAL
  Filled 2016-03-24: qty 1

## 2016-03-24 MED ORDER — ONDANSETRON HCL 4 MG PO TABS: 4.0000 mg | ORAL_TABLET | Freq: Four times a day (QID) | ORAL | Status: DC | PRN

## 2016-03-24 MED ORDER — MORPHINE SULFATE ER 30 MG PO TBCR
30.0000 mg | EXTENDED_RELEASE_TABLET | Freq: Every day | ORAL | Status: DC
  Administered 2016-03-24 – 2016-03-25 (×2): 30 mg via ORAL
  Filled 2016-03-24 (×2): qty 1

## 2016-03-24 MED ORDER — ACETAMINOPHEN 650 MG RE SUPP: 650.0000 mg | Freq: Four times a day (QID) | RECTAL | Status: DC | PRN

## 2016-03-24 MED ORDER — INSULIN ASPART 100 UNIT/ML ~~LOC~~ SOLN: 0.0000 [IU] | Freq: Every day | SUBCUTANEOUS | Status: DC

## 2016-03-24 MED ORDER — FAMOTIDINE IN NACL 20-0.9 MG/50ML-% IV SOLN
20.0000 mg | Freq: Two times a day (BID) | INTRAVENOUS | Status: DC
  Administered 2016-03-24 (×2): 20 mg via INTRAVENOUS
  Filled 2016-03-24 (×2): qty 50

## 2016-03-24 MED ORDER — SUCRALFATE 1 GM/10ML PO SUSP
1.0000 g | Freq: Three times a day (TID) | ORAL | Status: DC
  Administered 2016-03-24 – 2016-03-25 (×4): 1 g via ORAL
  Filled 2016-03-24 (×4): qty 10

## 2016-03-24 MED ORDER — INSULIN ASPART 100 UNIT/ML ~~LOC~~ SOLN: 0.0000 [IU] | Freq: Three times a day (TID) | SUBCUTANEOUS | Status: DC

## 2016-03-24 MED ORDER — ONDANSETRON HCL 4 MG/2ML IJ SOLN
4.0000 mg | Freq: Four times a day (QID) | INTRAMUSCULAR | Status: DC | PRN
  Administered 2016-03-24: 4 mg via INTRAVENOUS
  Filled 2016-03-24: qty 2

## 2016-03-24 MED ORDER — MORPHINE SULFATE ER 15 MG PO TBCR
15.0000 mg | EXTENDED_RELEASE_TABLET | Freq: Every day | ORAL | Status: DC
  Administered 2016-03-24: 15 mg via ORAL
  Filled 2016-03-24: qty 1

## 2016-03-24 NOTE — Telephone Encounter (Signed)
Noted, PCP will return to office next week and we will follow-up for TCM upon discharge.

## 2016-03-24 NOTE — H&P (Signed)
History and Physical  Patient Name: [name]     KGM:[id number]    DOB: August 28, 1950    DOA: 03/23/2016 PCP: Ann Held, DO   Patient coming from: Home  Chief Complaint: Melena  HPI: [name] is a 66 y.o. female with a past medical history significant for chronic pain on daily oral morphine, CKD III, NIDDM with neuropathy, HTN, NASH, hx of stomach cancer s/p partial gastrectomy, depression and recent chronic abdominal pain and vomiting who presents with melena.  The patient's recent history (which is familiar to Dr. Michail Sermon) is summarized below.  Over the last 24 hours: -The patient was seen at Dr. Kathline Magic office yesterday (Tuesday) for colonoscopy follow up from her hospitalization in June for abdominal pain (I believe).   -Had a severe spasm of abdominal pain at that point and he was concerned for SBO and sent to ER.  -In ER, CT of the abdomen and pelvis with contrast showed nothing. -Case was discussed with on-call GI and hospitalist, who arranged for outpatient gastric emptying study.  After coming home from ER this morning, the patient had several bowel movements, three of which were black "not all black, but partly black".  She had no emesis or hematemesis or coffee ground emesis.  She had no hematochezia.  She called Dr. Kathline Magic office, who recommended she be evaluated in the ER.  ED course: -Temp 99.85F, heart rate 100s --> 80s, respirations, blood pressure, and pulse oximetry normal -Na 142, K 3.8, BUN normal, Cr 1.5 (baseline 1.5-1.8), WBC 6.7 K, Hgb 13.2, unchanged from previous, AST 79 -On rectal exam she had no melena or blood, but FOBT testing was positive -The case was discussed with equal GI on call who recommended evaluation by Dr. Michail Sermon in the morning  She does not take a blood thinner.  She takes no NSAIDs (because "I've been told not to take ibuprofen and Aleve").  She has been given ketorolac IV once by her PCP in the last week, per records.  She  does not use alcohol.  She has never had a GI bleed before, she thinks.  She has never had an ulcer before and I see no record of either.  She was admitted in June for abdominal pain, nausea, intractable vomiting, and weight loss.  EGD at that time by Dr. Cristina Gong showed no gastric outlet obstruction, normal appearing stomach and esophageal mucosa, no evidence of cancer, and only a 1cm hiatal hernia.  CT angiogram of the abdomen was unremarkable.  US of the abdomen was unremarkable.  Antispasmodics and metoclopramide were added at that time.  In the interim, she was also admitted once for syncope in July, while at her PCP's office.  Echocardiogram, CT head, ECG and telemetry at that time were normal and her HCTZ was stopped and she improved with hydration.         ROS: Review of Systems  Constitutional: Positive for diaphoresis, malaise/fatigue and weight loss (over months). Negative for chills and fever.  Gastrointestinal: Positive for abdominal pain, melena (Image she shows is not of melena however), nausea and vomiting. Negative for blood in stool, constipation and diarrhea (Endorses diarrhea, by which she means 1 loose stool every 4-5 days).       Upset stomach, feeling of food sticking in her lower esophagus  Neurological: Positive for loss of consciousness.  All other systems reviewed and are negative.         Past Medical History:  Diagnosis Date  .  Asthma   . Degenerative joint disease   . Diabetes mellitus   . GERD (gastroesophageal reflux disease)   . Hyperlipidemia    no longer on medication for this  . Hypertension    actually taking for DM renal protection, not DM  . Neuropathy ([place])    bilateral hands and feet  . Sickle cell trait ([place])   . Sleep apnea   . stomach ca dx'd 2010   chemo/xrt comp 05/2009    Past Surgical History:  Procedure Laterality Date  . APPENDECTOMY    . CESAREAN SECTION    . COLON SURGERY    . ESOPHAGOGASTRODUODENOSCOPY N/A 10/15/2012    Procedure: ESOPHAGOGASTRODUODENOSCOPY (EGD);  Surgeon: Lear Ng, MD;  Location: [facility];  Service: Endoscopy;  Laterality: N/A;  . ESOPHAGOGASTRODUODENOSCOPY N/A 01/15/2016   Procedure: ESOPHAGOGASTRODUODENOSCOPY (EGD);  Surgeon: Ronald Lobo, MD;  Location: [facility];  Service: Endoscopy;  Laterality: N/A;  . HERNIA REPAIR    . SHOULDER SURGERY     Left  . TOTAL HIP ARTHROPLASTY     Right  . TOTAL KNEE ARTHROPLASTY     Left    Social History: Patient lives alone.  The patient walks unassisted.  Former smoker.    Allergies  Allergen Reactions  . Adhesive [Tape] Other (See Comments)    Burn Skin  . Codeine Other (See Comments)    paranoid  . Zocor [Simvastatin - High Dose] Other (See Comments)    Muscle spasms  . Penicillins Rash    Has patient had a PCN reaction causing immediate rash, facial/tongue/throat swelling, SOB or lightheadedness with hypotension: Yes Has patient had a PCN reaction causing severe rash involving mucus membranes or skin necrosis: No Has patient had a PCN reaction that required hospitalization does not remember.  Has patient had a PCN reaction occurring within the last 10 years: No If all of the above answers are "NO", then may proceed with Cephalosporin use.     Family history: family history includes Alzheimer's disease (age of onset: 48) in her father; Cancer in her maternal grandfather; Cancer (age of onset: 81) in her mother; Diabetes in her brother and maternal grandmother.  Prior to Admission medications   Medication Sig Start Date End Date Taking? Authorizing Provider  acetic acid (VOSOL) 2 % otic solution Place 4 drops into both ears 3 (three) times daily. 03/04/16  Yes Yvonne R Lowne Chase, DO  aspirin 81 MG tablet Take 81 mg by mouth at bedtime.   Yes Historical Provider, MD  carvedilol (COREG) 3.125 MG tablet Take 1 tablet (3.125 mg total) by mouth 2 (two) times daily with a meal. 02/16/16  Yes Yvonne R Lowne Chase, DO    dicyclomine (BENTYL) 20 MG tablet Take 1 tablet (20 mg total) by mouth 4 (four) times daily -  before meals and at bedtime. 02/15/16  Yes Volanda Napoleon, MD  EMBEDA 50-2 MG CPCR Take 1 capsule by mouth daily. 02/11/16  Yes Historical Provider, MD  glimepiride (AMARYL) 4 MG tablet Take 1 tablet (4 mg total) by mouth 2 (two) times daily. 02/19/16  Yes Yvonne R Lowne Chase, DO  HYDROcodone-acetaminophen (NORCO/VICODIN) 5-325 MG tablet Take 1 tablet by mouth every 6 (six) hours as needed. Patient taking differently: Take 1 tablet by mouth every 6 (six) hours as needed for moderate pain.  02/27/16  Yes Gloriann Loan, PA-C  metFORMIN (GLUCOPHAGE) 850 MG tablet Take 1 tablet (850 mg total) by mouth 2 (two) times daily. Repeat labs are  due now 12/30/15  Yes Philemon Kingdom, MD  morphine (MSIR) 15 MG tablet Take 15 mg three times daily as needed for pain 12/18/15  Yes Historical Provider, MD  Multiple Vitamin (MULTIVITAMIN WITH MINERALS) TABS tablet Take 1 tablet by mouth daily.   Yes Historical Provider, MD  NONFORMULARY OR COMPOUNDED ITEM IVF NSS 1 Liter over 1 hour 03/18/16  Yes Yvonne R Lowne Chase, DO  ondansetron (ZOFRAN-ODT) 8 MG disintegrating tablet Take 1 tablet (8 mg total) by mouth every 8 (eight) hours as needed for nausea or vomiting. 02/25/16  Yes Yvonne R Lowne Chase, DO  traMADol (ULTRAM) 50 MG tablet Take 1 tablet (50 mg total) by mouth every 6 (six) hours as needed for severe pain. 03/15/16  Yes Christopher Lawyer, PA-C  Blood Glucose Calibration (OT ULTRA/FASTTK CNTRL SOLN) SOLN  02/17/16   Historical Provider, MD  Blood Glucose Monitoring Suppl (ONE TOUCH ULTRA 2) w/Device KIT  02/17/16   Historical Provider, MD  Blood Pressure Monitoring (B-D ASSURE BPM/MANUAL ARM CUFF) MISC Check blood pressure daily. 02/18/16   Rosalita Chessman Chase, DO  DULoxetine (CYMBALTA) 60 MG capsule Take 1 capsule (60 mg total) by mouth 2 (two) times daily. Patient not taking: Reported on 03/22/2016 06/09/15   Rosalita Chessman  Chase, DO  lidocaine-prilocaine (EMLA) cream Apply 1 application topically as needed. Patient not taking: Reported on 03/22/2016 06/29/15   Volanda Napoleon, MD  LORazepam (ATIVAN) 0.5 MG tablet Take 1 tablet (0.5 mg total) by mouth at bedtime as needed for anxiety. Patient not taking: Reported on 03/22/2016 08/21/15   Alferd Apa Lowne Chase, DO  metaxalone (SKELAXIN) 800 MG tablet Take 1 tablet (800 mg total) by mouth 4 (four) times daily. Patient not taking: Reported on 03/22/2016 04/06/15   Rosalita Chessman Chase, DO  ONE TOUCH ULTRA TEST test strip  02/17/16   Historical Provider, MD  pantoprazole (PROTONIX) 40 MG tablet Take 1 tablet (40 mg total) by mouth daily. Patient not taking: Reported on 03/22/2016 01/13/16   Virgel Manifold, MD       Physical Exam: BP (!) 141/66 (BP Location: Right Arm)   Pulse 77   Temp 99.3 F (37.4 C) (Oral)   Resp 18   Ht 5' 7"  (1.702 m)   Wt 95.3 kg (210 lb)   SpO2 100%   BMI 32.89 kg/m  General appearance: Well-developed, obese elderly adult female, alert and in moderate distress from pain, groaning.   Eyes: Anicteric, conjunctiva pink, lids and lashes normal.     ENT: No nasal deformity, discharge, or epistaxis.  OP moist without lesions.   Lymph: No cervical or supraclavicular lymphadenopathy. Skin: Warm and dry.  No jaundice.  No suspicious rashes or lesions. Cardiac: RRR, nl S1-S2, no murmurs appreciated.  Capillary refill is brisk.  JVP not visible.  No LE edema.  Radial and DP pulses 2+ and symmetric. Respiratory: Normal respiratory rate and rhythm.  CTAB without rales or wheezes. GI: Abdomen soft without rigidity.  Moderate epigastric TTP without guarding or rebound. No ascites, distension, hepatosplenomegaly.   MSK: No deformities or effusions.  No clubbing/cyanosis. Neuro: Cranial nerves normal. Sensorium intact and responding to questions, attention normal.  Speech is fluent.  Moves all extremities equally and with normal coordination.    Psych:  Affect anxious.  Judgment and insight appear poor.       Labs on Admission:  I have personally reviewed following labs and imaging studies: CBC:  Recent Labs Lab 03/22/16 1217 03/23/16  2025  WBC 8.3 6.7  HGB 13.6 13.2  HCT 40.0 36.0  MCV 88.7 84.1  PLT 309 563   Basic Metabolic Panel:  Recent Labs Lab 03/22/16 1217 03/23/16 2025  NA 141 142  K 4.5 3.8  CL 108 110  CO2 20* 21*  GLUCOSE 190* 127*  BUN 15 11  CREATININE 1.65* 1.50*  CALCIUM 8.7* 8.6*   GFR: Estimated Creatinine Clearance: 44.9 mL/min (by C-G formula based on SCr of 1.5 mg/dL).  Liver Function Tests:  Recent Labs Lab 03/22/16 1217 03/23/16 2025  AST 50* 79*  ALT 36 40  ALKPHOS 91 68  BILITOT 1.2 1.0  PROT 6.7 6.5  ALBUMIN 2.8* 2.7*    Recent Labs Lab 03/22/16 1217  LIPASE 12   CBG:  Recent Labs Lab 03/24/16 0040  GLUCAP 102*   Hgb A1c June 2017: 11%          Radiological Exams on Admission: Personally reviewed: Ct Abdomen Pelvis Wo Contrast  Result Date: 03/22/2016 CLINICAL DATA:  Left-sided abdominal pain. EXAM: CT ABDOMEN AND PELVIS WITHOUT CONTRAST TECHNIQUE: Multidetector CT imaging of the abdomen and pelvis was performed following the standard protocol without IV contrast. COMPARISON:  CT abdomen is dated 02/27/2016, 01/13/2016 and 01/04/2016. FINDINGS: Lower chest:  No acute findings. Hepatobiliary: Liver is diffusely low in density consistent with fatty infiltration. Gallbladder is unremarkable. Pancreas: Atrophic. Spleen: Within normal limits in size. Adrenals/Urinary Tract: Adrenal glands appear normal. Kidneys are unremarkable without mass, stone or hydronephrosis. No perinephric inflammation. No ureteral or bladder calculi identified. Bladder appears normal, although partially obscured by metallic artifact emanating from patient's right hip prosthesis. Stomach/Bowel: Surgical changes of partial gastrectomy and gastrojejunostomy. No obstruction, fluid collection or other  surgical complicating feature in this area. No small or large bowel dilatation. No bowel wall thickening or evidence of bowel wall inflammation seen. Appendix is not seen, presumed appendectomy. Mild diverticulosis of the sigmoid colon without evidence of acute diverticulitis. Vascular/Lymphatic: Scattered atherosclerotic changes of the normal caliber abdominal aorta. No pathologic appearing lymph nodes. Reproductive: No mass or other significant abnormality. Other: No free fluid or abscess collection identified. No free intraperitoneal air. Musculoskeletal: Right hip arthroplasty hardware appears intact and appropriately positioned. Degenerative changes seen throughout the thoracolumbar spine, mild to moderate in degree. No acute or suspicious osseous finding. Superficial soft tissues are unremarkable. IMPRESSION: 1. No acute findings in the abdomen or pelvis. 2. Postsurgical changes of partial gastrectomy and gastrojejunostomy. No evidence of surgical complicating feature. No bowel obstruction or evidence of bowel wall inflammation. 3. Severe hepatic steatosis, stable. 4. Mild colonic diverticulosis without evidence of acute diverticulitis. 5. Aortic atherosclerosis. 6. Additional chronic/incidental findings detailed above. Electronically Signed   By: Franki Cabot M.D.   On: 03/22/2016 18:20    EKG from yesterday: Independently reviewed. Rate 114, QTc 416, no ischemic changes, sinus rhythm.  Echocardiogram July 2017: EF 87-56% Grade I diastolic dysfunction No significant valvular idsease  Carotid doppler July 2017: Normal    Endoscopy June 2017: - Normal esophagus. - 1 cm hiatal hernia. - Patent Billroth II gastrojejunostomy was found, characterized by healthy appearing Mucosa. - Normal examined jejunum. - No specimens collected. - No evidence of intra-luminal recurrence of gastric cancer. - No source of patient's upper abdominal pain endoscopically evident.       Assessment/Plan 1.  Melena:  The patient reports melena and is FOBT + on exam without evidence of drop in Hgb or evidence of melena on rectal exam.  In addition,  she shows a picture of her reportedly black stool which is orange and formed in appearance, not frankly melenic.    She had a normal endoscopy back in June and has had minimal NSAID exposure since then.   -NPO until seen by GI -MIVF -Famotidine IV for now -Consult to GI, appreciate cares   2. Syncope:  No previous history of heart disease or myocardial infarction.  Current episode occurring under stress and poor PO intake.  Recently normal echocardiogram and normal ECG. -Fluids -No further workup indicated  3. NIDDM:  -Hold orals -Continue aspirin -SSI q4hrs while NPO  4. Chronic pain from neuropathy:  -Continue Embeda -Continue morphine IR   5. CKD III:  Stable  6. HTN:  Normotensive. -Continue carvedilol  7. Elevated transaminase:  From hepatic steatosis, presumed NASH.  8. Chronic abdominal pain and nausea/vomiting: -Continue Bentyl -Continue metoclopramide -Consult to GI, appreciate cares  9. Depression: She has a history of depressive episodes per her chart.  She is currently not on therapy.     DVT prophylaxis: SCDs  Code Status: FULL  Family Communication: None present  Disposition Plan: Anticipate GI consultation in AM and disposition per attending. Consults called: GI, Dr. Alessandra Bevels Admission status: OBS, med surg    Medical decision making: Paged re: patient at 2:00AM.  Patient seen at 2:05 AM on 03/24/2016.  The patient was discussed with Shary Decamp, PA-C. What exists of the patient's chart was reviewed in depth.  Clinical condition: stable.        Edwin Dada [facility] Pager [phone]

## 2016-03-24 NOTE — Telephone Encounter (Signed)
Relation to JW:TGRM Call back number:mobile # [phone] / [facility] room 1534  Pharmacy:  Reason for call:  Dr. Lear Ng, MD visited patient a few days but couldn't assess patient due to the pain she was in.   Patient fell while she was trying to get into her daughter car.  Patient wanted to inform PCP she has been admitted to [facility] room 1534.    Advised patient PCP is out of the office. Please advise

## 2016-03-24 NOTE — Consult Note (Signed)
[facility]astroenterology Consultation Note  Referring Provider: Dr. Phillips Climes [facility]) Primary Care Physician:  Ann Held, DO Primary Gastroenterologist:  Dr. Wilford Corner  Reason for Consultation:  Abdominal pain  HPI: [name] is a 65 y.o. female whom we've been asked to see for abdominal pain.  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  She has history of gastric cancer with partial gastrectomy.  For the past 2-3 months, she has had constant sharp and searing pain in epigastric and left upper quadrant.  Pain does not improve or worsen with eating.  Pain does not improve or worsen after bowel movements.  She has loose stools every few days, since on narcotics, no improvement or worsening of pain afterwards.  She has not had improvement with antisecretory-type therapies.  She reports losing over 40 lbs over the past few months due to this pain.  Some dark stools, hemoccult-positive, over the past few days.   Endoscopy two months ago for abdominal pain showed post-operative changes otherwise no clear source of pain seen, no recurrent gastric cancer seen.  Last colonoscopy 4-5 years ago per patient recollection.  Multiple labs and CT scans, and multiple ED visits have not shown any explanation for patient's symptoms.  Patient was admitted due to recurrent ED visits for ongoing abdominal pain.   Past Medical History:  Diagnosis Date  . Asthma   . Degenerative joint disease   . Diabetes mellitus   . GERD (gastroesophageal reflux disease)   . Hyperlipidemia    no longer on medication for this  . Hypertension    actually taking for DM renal protection, not DM  . Neuropathy ([place])    bilateral hands and feet  . Sickle cell trait ([place])   . Sleep apnea   . stomach ca dx'd 2010   chemo/xrt comp 05/2009    Past Surgical History:  Procedure Laterality Date  . APPENDECTOMY    . CESAREAN SECTION    . COLON SURGERY    . ESOPHAGOGASTRODUODENOSCOPY N/A  10/15/2012   Procedure: ESOPHAGOGASTRODUODENOSCOPY (EGD);  Surgeon: Lear Ng, MD;  Location: [facility];  Service: Endoscopy;  Laterality: N/A;  . ESOPHAGOGASTRODUODENOSCOPY N/A 01/15/2016   Procedure: ESOPHAGOGASTRODUODENOSCOPY (EGD);  Surgeon: Ronald Lobo, MD;  Location: [facility];  Service: Endoscopy;  Laterality: N/A;  . HERNIA REPAIR    . SHOULDER SURGERY     Left  . TOTAL HIP ARTHROPLASTY     Right  . TOTAL KNEE ARTHROPLASTY     Left    Prior to Admission medications   Medication Sig Start Date End Date Taking? Authorizing Provider  acetic acid (VOSOL) 2 % otic solution Place 4 drops into both ears 3 (three) times daily. 03/04/16  Yes Yvonne R Lowne Chase, DO  aspirin 81 MG tablet Take 81 mg by mouth at bedtime.   Yes Historical Provider, MD  carvedilol (COREG) 3.125 MG tablet Take 1 tablet (3.125 mg total) by mouth 2 (two) times daily with a meal. 02/16/16  Yes Yvonne R Lowne Chase, DO  dicyclomine (BENTYL) 20 MG tablet Take 1 tablet (20 mg total) by mouth 4 (four) times daily -  before meals and at bedtime. 02/15/16  Yes Volanda Napoleon, MD  EMBEDA 50-2 MG CPCR Take 1 capsule by mouth daily. 02/11/16  Yes Historical Provider, MD  glimepiride (AMARYL) 4 MG tablet Take 1 tablet (4 mg total) by mouth 2 (two) times daily. 02/19/16  Yes Ann Held, DO  HYDROcodone-acetaminophen (NORCO/VICODIN) 5-325 MG tablet Take 1 tablet by mouth every 6 (six) hours as needed. Patient taking differently: Take 1 tablet by mouth every 6 (six) hours as needed for moderate pain.  02/27/16  Yes Gloriann Loan, PA-C  metFORMIN (GLUCOPHAGE) 850 MG tablet Take 1 tablet (850 mg total) by mouth 2 (two) times daily. Repeat labs are due now 12/30/15  Yes Philemon Kingdom, MD  morphine (MSIR) 15 MG tablet Take 15 mg three times daily as needed for pain 12/18/15  Yes Historical Provider, MD  Multiple Vitamin (MULTIVITAMIN WITH MINERALS) TABS tablet Take 1 tablet by mouth daily.   Yes Historical Provider,  MD  NONFORMULARY OR COMPOUNDED ITEM IVF NSS 1 Liter over 1 hour 03/18/16  Yes Yvonne R Lowne Chase, DO  ondansetron (ZOFRAN-ODT) 8 MG disintegrating tablet Take 1 tablet (8 mg total) by mouth every 8 (eight) hours as needed for nausea or vomiting. 02/25/16  Yes Yvonne R Lowne Chase, DO  traMADol (ULTRAM) 50 MG tablet Take 1 tablet (50 mg total) by mouth every 6 (six) hours as needed for severe pain. 03/15/16  Yes Christopher Lawyer, PA-C  Blood Glucose Calibration (OT ULTRA/FASTTK CNTRL SOLN) SOLN  02/17/16   Historical Provider, MD  Blood Glucose Monitoring Suppl (ONE TOUCH ULTRA 2) w/Device KIT  02/17/16   Historical Provider, MD  Blood Pressure Monitoring (B-D ASSURE BPM/MANUAL ARM CUFF) MISC Check blood pressure daily. 02/18/16   Rosalita Chessman Chase, DO  ONE TOUCH ULTRA TEST test strip  02/17/16   Historical Provider, MD    Current Facility-Administered Medications  Medication Dose Route Frequency Provider Last Rate Last Dose  . 0.9 %  sodium chloride infusion   Intravenous Continuous Edwin Dada, MD 125 mL/hr at 03/24/16 0300 125 mL/hr at 03/24/16 0300  . acetaminophen (TYLENOL) tablet 650 mg  650 mg Oral Q6H PRN Edwin Dada, MD       Or  . acetaminophen (TYLENOL) suppository 650 mg  650 mg Rectal Q6H PRN Edwin Dada, MD      . aspirin EC tablet 81 mg  81 mg Oral QHS Edwin Dada, MD      . carvedilol (COREG) tablet 3.125 mg  3.125 mg Oral BID WC Edwin Dada, MD   3.125 mg at 03/24/16 0908  . dicyclomine (BENTYL) tablet 20 mg  20 mg Oral TID AC & HS Edwin Dada, MD   20 mg at 03/24/16 0940  . famotidine (PEPCID) IVPB 20 mg premix  20 mg Intravenous Q12H Edwin Dada, MD   20 mg at 03/24/16 0907  . insulin aspart (novoLOG) injection 0-15 Units  0-15 Units Subcutaneous Q4H Edwin Dada, MD      . metoCLOPramide (REGLAN) tablet 10 mg  10 mg Oral TID AC & HS Edwin Dada, MD   10 mg at 03/24/16 0908  . morphine (MS  CONTIN) 12 hr tablet 15 mg  15 mg Oral QHS Dawood S Elgergawy, MD      . morphine (MS CONTIN) 12 hr tablet 30 mg  30 mg Oral Daily Albertine Patricia, MD   30 mg at 03/24/16 0907  . morphine (MSIR) tablet 15 mg  15 mg Oral TID PRN Edwin Dada, MD      . ondansetron (ZOFRAN) tablet 4 mg  4 mg Oral Q6H PRN Edwin Dada, MD       Or  . ondansetron (ZOFRAN) injection 4 mg  4 mg Intravenous Q6H PRN  Edwin Dada, MD        Allergies as of 03/23/2016 - Review Complete 03/23/2016  Allergen Reaction Noted  . Adhesive [tape] Other (See Comments) 01/02/2015  . Codeine Other (See Comments)   . Zocor [simvastatin - high dose] Other (See Comments) 09/07/2011  . Penicillins Rash 01/03/2008    Family History  Problem Relation Age of Onset  . Diabetes Brother   . Alzheimer's disease Father 49  . Cancer Mother 44  . Diabetes Maternal Grandmother   . Cancer Maternal Grandfather     lung  . Suicidality Neg Hx   . Depression Neg Hx   . Dementia Neg Hx   . Anxiety disorder Neg Hx     Social History   Social History  . Marital status: Divorced    Spouse name: N/A  . Number of children: N/A  . Years of education: N/A   Occupational History  . Not on file.   Social History Main Topics  . Smoking status: Former Smoker    Packs/day: 1.00    Years: 15.00    Types: Cigarettes    Start date: 09/27/1973    Quit date: 10/15/1988  . Smokeless tobacco: Never Used     Comment: quit smoking 14 years ago  . Alcohol use No  . Drug use: No  . Sexual activity: No   Other Topics Concern  . Not on file   Social History Narrative  . No narrative on file    Review of Systems: Positive = bold Gen: Denies any fever, chills, rigors, night sweats, anorexia, fatigue, weakness, malaise, involuntary weight loss, and sleep disorder CV: Denies chest pain, angina, palpitations, syncope, orthopnea, PND, peripheral edema, and claudication. Resp: Denies dyspnea, cough, sputum,  wheezing, coughing up blood. GI: Described in detail in HPI.    GU : Denies urinary burning, blood in urine, urinary frequency, urinary hesitancy, nocturnal urination, and urinary incontinence. MS: Denies joint pain or swelling.  Denies muscle weakness, cramps, atrophy.  Derm: Denies rash, itching, oral ulcerations, hives, unhealing ulcers.  Psych: Denies depression, anxiety, memory loss, suicidal ideation, hallucinations,  and confusion. Heme: Denies bruising, bleeding, and enlarged lymph nodes. Neuro:  Denies any headaches, dizziness, paresthesias. Endo:  Denies any problems with DM, thyroid, adrenal function.  Physical Exam: Vital signs in last 24 hours: Temp:  [98 F (36.7 C)-99.8 F (37.7 C)] 98 F (36.7 C) (08/31 1028) Pulse Rate:  [42-127] 77 (08/31 1028) Resp:  [16-20] 18 (08/31 1028) BP: (102-141)/(57-86) 108/73 (08/31 1028) SpO2:  [93 %-100 %] 100 % (08/30 2200) Weight:  [95.3 kg (210 lb)] 95.3 kg (210 lb) (08/30 1835) Last BM Date: 03/23/16 General:   Alert,  Obese, older-appearing than stated age, NAD Head:  Normocephalic and atraumatic. Eyes:  Sclera clear, no icterus.   Conjunctiva pink. Ears:  Normal auditory acuity. Nose:  No deformity, discharge,  or lesions. Mouth:  No deformity or lesions.  Oropharynx pink & moist. Neck:  Supple; no masses or thyromegaly. Lungs:  Clear throughout to auscultation.   No wheezes, crackles, or rhonchi. No acute distress. Heart:  Regular rate and rhythm; no murmurs, clicks, rubs,  or gallops. Abdomen:  Soft, epigastric and left upper quadrant tenderness with voluntary guarding (decreases upon distraction maneuvers).  No evidence of peritonitis. No masses, hepatosplenomegaly or hernias noted. Normal bowel sounds, without guarding, and without rebound.     Msk:  Symmetrical without gross deformities. Normal posture. Pulses:  Normal pulses noted. Extremities:  Without clubbing or  edema. Neurologic:  Alert and  oriented x4;  Diffusely  weak, otherwise grossly normal neurologically. Skin:  Intact without significant lesions or rashes. Cervical Nodes:  No significant cervical adenopathy. Psych:  Alert and cooperative. Normal mood and affect.   Lab Results:  Recent Labs  03/22/16 1217 03/23/16 2025 03/24/16 0502  WBC 8.3 6.7 8.6  HGB 13.6 13.2 11.6*  HCT 40.0 36.0 33.0*  PLT 309 309 290   BMET  Recent Labs  03/22/16 1217 03/23/16 2025 03/24/16 0502  NA 141 142 145  K 4.5 3.8 3.7  CL 108 110 115*  CO2 20* 21* 23  GLUCOSE 190* 127* 94  BUN 15 11 10   CREATININE 1.65* 1.50* 1.38*  CALCIUM 8.7* 8.6* 8.3*   LFT  Recent Labs  03/24/16 0502  PROT 5.7*  ALBUMIN 2.4*  AST 68*  ALT 34  ALKPHOS 62  BILITOT 1.0   PT/INR No results for input(s): LABPROT, INR in the last 72 hours.  Studies/Results: Ct Abdomen Pelvis Wo Contrast  Result Date: 03/22/2016 CLINICAL DATA:  Left-sided abdominal pain. EXAM: CT ABDOMEN AND PELVIS WITHOUT CONTRAST TECHNIQUE: Multidetector CT imaging of the abdomen and pelvis was performed following the standard protocol without IV contrast. COMPARISON:  CT abdomen is dated 02/27/2016, 01/13/2016 and 01/04/2016. FINDINGS: Lower chest:  No acute findings. Hepatobiliary: Liver is diffusely low in density consistent with fatty infiltration. Gallbladder is unremarkable. Pancreas: Atrophic. Spleen: Within normal limits in size. Adrenals/Urinary Tract: Adrenal glands appear normal. Kidneys are unremarkable without mass, stone or hydronephrosis. No perinephric inflammation. No ureteral or bladder calculi identified. Bladder appears normal, although partially obscured by metallic artifact emanating from patient's right hip prosthesis. Stomach/Bowel: Surgical changes of partial gastrectomy and gastrojejunostomy. No obstruction, fluid collection or other surgical complicating feature in this area. No small or large bowel dilatation. No bowel wall thickening or evidence of bowel wall inflammation  seen. Appendix is not seen, presumed appendectomy. Mild diverticulosis of the sigmoid colon without evidence of acute diverticulitis. Vascular/Lymphatic: Scattered atherosclerotic changes of the normal caliber abdominal aorta. No pathologic appearing lymph nodes. Reproductive: No mass or other significant abnormality. Other: No free fluid or abscess collection identified. No free intraperitoneal air. Musculoskeletal: Right hip arthroplasty hardware appears intact and appropriately positioned. Degenerative changes seen throughout the thoracolumbar spine, mild to moderate in degree. No acute or suspicious osseous finding. Superficial soft tissues are unremarkable. IMPRESSION: 1. No acute findings in the abdomen or pelvis. 2. Postsurgical changes of partial gastrectomy and gastrojejunostomy. No evidence of surgical complicating feature. No bowel obstruction or evidence of bowel wall inflammation. 3. Severe hepatic steatosis, stable. 4. Mild colonic diverticulosis without evidence of acute diverticulitis. 5. Aortic atherosclerosis. 6. Additional chronic/incidental findings detailed above. Electronically Signed   By: Franki Cabot M.D.   On: 03/22/2016 18:20   Impression:  1.  Abdominal pain, left-sided, ongoing for few months.  Negative endoscopy 2 months ago, negative CT scans, negative labs.  I am not convinced this is GI tract in origin, as it is constant, no abnormalities on labs/imaging, and no exacerbation of symptoms with eating, no improvement with defecation.  Might be musculoskeletal or neuropathic pain. 2.  Dark stools, hemoccult-positive.  No evidence of ongoing overt GI bleeding.  Normal BUN. 3.  Anemia, mild, likely dilutional, doubt related to GI bleeding. 4.  Remote history of gastric cancer with subtotal gastrectomy.  No evidence of recurrence on recent endoscopy.  Plan:  1.  UGI series to assess for possible ulcer or trouble at  her gastrojejunal anastomosis. 2.  PPI and sucralfate. 3.   Advance diet as tolerated. 4.  Trial of antispasmodics. 5.  Minimize use of narcotics. 6.  Expedited outpatient colonoscopy. 7.  Patient ok to be discharged home from GI perspective if UGI series is negative. 8.  Case discussed with Dr. Waldron Labs.   LOS: 0 days   Reneka Nebergall M  03/24/2016, 11:10 AM  Pager [phone] If no answer or after 5 PM call [phone]

## 2016-03-24 NOTE — Care Management Obs Status (Signed)
[facility] NOTIFICATION   Patient Details  Name: CUMA POLYAKOV MRN: [id number] Date of Birth: [birth year]   Medicare Observation Status Notification Given:  Yes    Guadalupe Maple, RN 03/24/2016, 11:09 AM

## 2016-03-24 NOTE — Progress Notes (Signed)
PROGRESS NOTE                                                                                                                                                                                                             Patient Demographics:    [name], is a 65 y.o. female, DOB - Sep 10, 1950, OMV:[id number]  Admit date - 03/23/2016   Admitting Physician Edwin Dada, MD  Outpatient Primary MD for the patient is Ann Held, DO  LOS - 0  Outpatient Specialists: Dr schooler  No chief complaint on file.      Brief Narrative   65 year old female with past medical history of chronic pain, multiple hospital visits due to abdominal pain, followed by Dr. Michail Sermon as an outpatient, recent endoscopy with no acute findings, she presents with melena, seen by GI.   Subjective:    [name] today has, No headache, No chest pain, No Nausea, Complaints of abdominal pain, chronic, baseline.  Assessment  & Plan :    Principal Problem:   Melena Active Problems:   Essential hypertension   Morbid obesity due to excess calories ([place])   Uncontrolled type 2 diabetes mellitus with diabetic polyneuropathy, without long-term current use of insulin (HCC)   Chronic pain syndrome   CKD (chronic kidney disease), stage III   Syncope   Fatty liver   Melena - Patient is Hemoccult positive, GI input greatly appreciated, no evidence of significant GI bleed, recent endoscopy with no acute findings, mild drop in hemoglobin most likely delusional, plan for upper GI to evaluate for gastrojejunal anastomosis, possible ulcer. - Continue a soft diet  Syncope No previous history of heart disease or myocardial infarction.  Current episode occurring under stress and poor PO intake.  Recently normal echocardiogram and normal ECG. - Fluids - No further workup indicated  NIDDM - Hold orals - Continue aspirin - SSI q4hrs while NPO  Chronic pain from  neuropathy:  - on Embeda, nonformulary, converted to equivalent dose of MS morphine - Continue morphine IR   CKD III -Stable   HTN Normotensive. 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  Chronic abdominal pain and nausea/vomiting: -Continue Bentyl -Continue metoclopramide -Consult to GI, appreciate cares  Depression: She has a history of depressive episodes per her chart.  She  is currently not on therapy.     Code Status : Full  Family Communication  : None at bedside  Disposition Plan  : Home  Consults  :  GI  Procedures  : None  DVT Prophylaxis  :  SCDs   Lab Results  Component Value Date   PLT 290 03/24/2016    Antibiotics  :  Anti-infectives    None        Objective:   Vitals:   03/24/16 0600 03/24/16 0605 03/24/16 0907 03/24/16 1028  BP: 105/78 102/70  108/73  Pulse: 93 (!) 127 90 77  Resp:  18  18  Temp:    98 F (36.7 C)  TempSrc:    Axillary  SpO2:      Weight:      Height:        Wt Readings from Last 3 Encounters:  03/23/16 95.3 kg (210 lb)  03/18/16 94 kg (207 lb 3.2 oz)  03/15/16 95.3 kg (210 lb)     Intake/Output Summary (Last 24 hours) at 03/24/16 1329 Last data filed at 03/24/16 1000  Gross per 24 hour  Intake              925 ml  Output                0 ml  Net              925 ml     Physical Exam  Awake Alert, Oriented X 3 Supple Neck,No JVD,  Symmetrical Chest wall movement, Good air movement bilaterally, CTAB RRR,No Gallops,Rubs or new Murmurs, No Parasternal Heave +ve B.Sounds, Abd Soft, No tenderness, No rebound - guarding or rigidity. No Cyanosis, Clubbing or edema, No new Rash or bruise     Data Review:    CBC  Recent Labs Lab 03/22/16 1217 03/23/16 2025 03/24/16 0502  WBC 8.3 6.7 8.6  HGB 13.6 13.2 11.6*  HCT 40.0 36.0 33.0*  PLT 309 309 290  MCV 88.7 84.1 85.9  MCH 30.2 30.8 30.2  MCHC 34.0 36.7* 35.2  RDW 15.2 15.2 15.3     Chemistries   Recent Labs Lab 03/22/16 1217 03/23/16 2025 03/24/16 0502  NA 141 142 145  K 4.5 3.8 3.7  CL 108 110 115*  CO2 20* 21* 23  GLUCOSE 190* 127* 94  BUN 15 11 10   CREATININE 1.65* 1.50* 1.38*  CALCIUM 8.7* 8.6* 8.3*  AST 50* 79* 68*  ALT 36 40 34  ALKPHOS 91 68 62  BILITOT 1.2 1.0 1.0   ------------------------------------------------------------------------------------------------------------------ No results for input(s): CHOL, HDL, LDLCALC, TRIG, CHOLHDL, LDLDIRECT in the last 72 hours.  Lab Results  Component Value Date   HGBA1C 11.2 (H) 12/30/2015   ------------------------------------------------------------------------------------------------------------------ No results for input(s): TSH, T4TOTAL, T3FREE, THYROIDAB in the last 72 hours.  Invalid input(s): FREET3 ------------------------------------------------------------------------------------------------------------------ No results for input(s): VITAMINB12, FOLATE, FERRITIN, TIBC, IRON, RETICCTPCT in the last 72 hours.  Coagulation profile No results for input(s): INR, PROTIME in the last 168 hours.  No results for input(s): DDIMER in the last 72 hours.  Cardiac Enzymes No results for input(s): CKMB, TROPONINI, MYOGLOBIN in the last 168 hours.  Invalid input(s): CK ------------------------------------------------------------------------------------------------------------------ No results found for: BNP  Inpatient Medications  Scheduled Meds: . aspirin EC  81 mg Oral QHS  . carvedilol  3.125 mg Oral BID WC  . dicyclomine  20 mg Oral TID AC & HS  . insulin aspart  0-15 Units Subcutaneous Q4H  .  metoCLOPramide  10 mg Oral TID AC & HS  . morphine  15 mg Oral QHS  . morphine  30 mg Oral Daily  . sucralfate  1 g Oral TID WC & HS   Continuous Infusions: . sodium chloride 125 mL/hr (03/24/16 0300)   PRN Meds:.acetaminophen **OR** acetaminophen, morphine, ondansetron **OR** ondansetron  (ZOFRAN) IV  Micro Results No results found for this or any previous visit (from the past 240 hour(s)).  Radiology Reports Ct Abdomen Pelvis Wo Contrast  Result Date: 03/22/2016 CLINICAL DATA:  Left-sided abdominal pain. EXAM: CT ABDOMEN AND PELVIS WITHOUT CONTRAST TECHNIQUE: Multidetector CT imaging of the abdomen and pelvis was performed following the standard protocol without IV contrast. COMPARISON:  CT abdomen is dated 02/27/2016, 01/13/2016 and 01/04/2016. FINDINGS: Lower chest:  No acute findings. Hepatobiliary: Liver is diffusely low in density consistent with fatty infiltration. Gallbladder is unremarkable. Pancreas: Atrophic. Spleen: Within normal limits in size. Adrenals/Urinary Tract: Adrenal glands appear normal. Kidneys are unremarkable without mass, stone or hydronephrosis. No perinephric inflammation. No ureteral or bladder calculi identified. Bladder appears normal, although partially obscured by metallic artifact emanating from patient's right hip prosthesis. Stomach/Bowel: Surgical changes of partial gastrectomy and gastrojejunostomy. No obstruction, fluid collection or other surgical complicating feature in this area. No small or large bowel dilatation. No bowel wall thickening or evidence of bowel wall inflammation seen. Appendix is not seen, presumed appendectomy. Mild diverticulosis of the sigmoid colon without evidence of acute diverticulitis. Vascular/Lymphatic: Scattered atherosclerotic changes of the normal caliber abdominal aorta. No pathologic appearing lymph nodes. Reproductive: No mass or other significant abnormality. Other: No free fluid or abscess collection identified. No free intraperitoneal air. Musculoskeletal: Right hip arthroplasty hardware appears intact and appropriately positioned. Degenerative changes seen throughout the thoracolumbar spine, mild to moderate in degree. No acute or suspicious osseous finding. Superficial soft tissues are unremarkable. IMPRESSION: 1.  No acute findings in the abdomen or pelvis. 2. Postsurgical changes of partial gastrectomy and gastrojejunostomy. No evidence of surgical complicating feature. No bowel obstruction or evidence of bowel wall inflammation. 3. Severe hepatic steatosis, stable. 4. Mild colonic diverticulosis without evidence of acute diverticulitis. 5. Aortic atherosclerosis. 6. Additional chronic/incidental findings detailed above. Electronically Signed   By: Franki Cabot M.D.   On: 03/22/2016 18:20   Ct Abdomen Pelvis W Contrast  Result Date: 02/27/2016 CLINICAL DATA:  Generalized diffuse abdominal pain. Nausea. Symptoms for 3 days. EXAM: CT ABDOMEN AND PELVIS WITH CONTRAST TECHNIQUE: Multidetector CT imaging of the abdomen and pelvis was performed using the standard protocol following bolus administration of intravenous contrast. CONTRAST:  95m ISOVUE-300 IOPAMIDOL (ISOVUE-300) INJECTION 61% COMPARISON:  Most recent CT comparison 01/13/2016 FINDINGS: Lower chest: Chronic scarring medial left lower lobe. No pleural fluid. Liver: Severe steatosis.  No evidence of focal lesion. Hepatobiliary: Gallbladder physiologically distended, no calcified stone. No biliary dilatation. Pancreas: Pancreatic atrophy.  No ductal dilatation or inflammation. Spleen: Normal. Adrenal glands: No nodule. Kidneys: Left upper pole renal scarring with decreased profusion is unchanged from prior. There is no hydronephrosis or perinephric edema. Ureters are decompressed. Stomach/Bowel: Small hiatal hernia. Stomach physiologically distended. Postsurgical change post partial gastrectomy. No obvious gastric mass by CT. Questioning gastrojejunostomy. There are no dilated or thickened small bowel loops. No bowel obstruction, enteric contrast reaches the descending colon. Short segment of colonic wall thickening involving the mid transverse colon with minimal adjacent edema. Diverticulosis of the descending colon without acute diverticulitis. Post appendectomy.  Vascular/Lymphatic: No retroperitoneal adenopathy. Abdominal aorta is normal in caliber. Atherosclerosis without  aneurysm. Mesenteric vessels are patent. Reproductive: Uterus and adnexa are normal for age. Lower most pelvis partially obscured by streak artifact from right hip arthroplasty. Bladder: Obscured by streak artifact from right hip arthroplasty. Other: No free air, free fluid, or intra-abdominal fluid collection. Postsurgical change of the ventral upper abdominal wall, stable in appearance from prior. Musculoskeletal: There are no acute or suspicious osseous abnormalities. Stable degenerative change in the lumbar spine with primarily facet arthropathy. Minimal anterolisthesis of L4 on L5 is again seen. Right hip arthroplasty. IMPRESSION: 1. Mild wall thickening adjacent edema of the mid transverse colon. This may be infectious or inflammatory. Neoplastic etiology is not excluded, and would be better assessed with direct visualization with colonoscopy. 2. Otherwise no acute abnormality in the abdomen/pelvis. Stable findings of severe hepatic steatosis, small hiatal hernia, distal colonic diverticulosis, partial gastrectomy and left renal scarring. Electronically Signed   By: Jeb Levering M.D.   On: 02/27/2016 21:54    Erynn Vaca M.D on 03/24/2016 at 1:29 PM  Between 7am to 7pm - Pager - [phone]  After 7pm go to [email or web address] - password [facility]  [facility] -  Office  [phone]

## 2016-03-24 NOTE — Progress Notes (Signed)
Discharge planning, spoke with patient at beside. Was arranged previously with [facility] for med management d/t insurance issues. Preferred [facility], now has [facility] Medicare and asking to go with [facility] now. Contacted [facility] for referral. Provided them with the new insurance and they will follow for d/c needs. [phone]

## 2016-03-25 ENCOUNTER — Observation Stay (HOSPITAL_COMMUNITY): Payer: Medicare HMO

## 2016-03-25 DIAGNOSIS — K921 Melena: Principal | ICD-10-CM

## 2016-03-25 DIAGNOSIS — C169 Malignant neoplasm of stomach, unspecified: Secondary | ICD-10-CM | POA: Diagnosis not present

## 2016-03-25 LAB — CBC
HCT: 31.6 % — ABNORMAL LOW (ref 36.0–46.0)
Hemoglobin: 11.3 g/dL — ABNORMAL LOW (ref 12.0–15.0)
MCH: 30.4 pg (ref 26.0–34.0)
MCHC: 35.8 g/dL (ref 30.0–36.0)
MCV: 84.9 fL (ref 78.0–100.0)
PLATELETS: 259 10*3/uL (ref 150–400)
RBC: 3.72 MIL/uL — AB (ref 3.87–5.11)
RDW: 15.2 % (ref 11.5–15.5)
WBC: 8 10*3/uL (ref 4.0–10.5)

## 2016-03-25 LAB — GLUCOSE, CAPILLARY
GLUCOSE-CAPILLARY: 118 mg/dL — AB (ref 65–99)
Glucose-Capillary: 106 mg/dL — ABNORMAL HIGH (ref 65–99)

## 2016-03-25 MED ORDER — SUCRALFATE 1 G PO TABS: 1.0000 g | ORAL_TABLET | Freq: Three times a day (TID) | ORAL | 0 refills | Status: DC

## 2016-03-25 NOTE — Discharge Instructions (Signed)
Follow with Primary MD Ann Held, DO in 7 days   Get CBC, CMP,checked  by Primary MD next visit.    Activity: As tolerated with Full fall precautions use walker/cane & assistance as needed   Disposition Home    Diet: Heart Healthy  , with feeding assistance and aspiration precautions.  For Heart failure patients - Check your Weight same time everyday, if you gain over 2 pounds, or you develop in leg swelling, experience more shortness of breath or chest pain, call your Primary MD immediately. Follow Cardiac Low Salt Diet and 1.5 lit/day fluid restriction.   On your next visit with your primary care physician please Get Medicines reviewed and adjusted.   Please request your Prim.MD to go over all Hospital Tests and Procedure/Radiological results at the follow up, please get all Hospital records sent to your Prim MD by signing hospital release before you go home.   If you experience worsening of your admission symptoms, develop shortness of breath, life threatening emergency, suicidal or homicidal thoughts you must seek medical attention immediately by calling 911 or calling your MD immediately  if symptoms less severe.  You Must read complete instructions/literature along with all the possible adverse reactions/side effects for all the Medicines you take and that have been prescribed to you. Take any new Medicines after you have completely understood and accpet all the possible adverse reactions/side effects.   Do not drive, operating heavy machinery, perform activities at heights, swimming or participation in water activities or provide baby sitting services if your were admitted for syncope or siezures until you have seen by Primary MD or a Neurologist and advised to do so again.  Do not drive when taking Pain medications.    Do not take more than prescribed Pain, Sleep and Anxiety Medications  Special Instructions: If you have smoked or chewed Tobacco  in the last 2 yrs  please stop smoking, stop any regular Alcohol  and or any Recreational drug use.  Wear Seat belts while driving.   Please note  You were cared for by a hospitalist during your hospital stay. If you have any questions about your discharge medications or the care you received while you were in the hospital after you are discharged, you can call the unit and asked to speak with the hospitalist on call if the hospitalist that took care of you is not available. Once you are discharged, your primary care physician will handle any further medical issues. Please note that NO REFILLS for any discharge medications will be authorized once you are discharged, as it is imperative that you return to your primary care physician (or establish a relationship with a primary care physician if you do not have one) for your aftercare needs so that they can reassess your need for medications and monitor your lab values.

## 2016-03-25 NOTE — Progress Notes (Signed)
Discharge instructions discussed with patient, verbalized agreement and understanding 

## 2016-03-25 NOTE — Discharge Summary (Signed)
[name], is a 65 y.o. female  DOB May 16, 1951  MRN [id number].  Admission date:  03/23/2016  Admitting Physician  Edwin Dada, MD  Discharge Date:  03/25/2016   Primary MD  Ann Held, DO  Recommendations for primary care physician for things to follow:  - Please CBC, BMP during next visit - He is follow with GI Dr. Michail Sermon regarding outpatient colonoscopy  Admission Diagnosis  Abdominal pain, unspecified abdominal location [R10.9] Gastrointestinal hemorrhage, unspecified gastritis, unspecified gastrointestinal hemorrhage type [K92.2]   Discharge Diagnosis  Abdominal pain, unspecified abdominal location [R10.9] Gastrointestinal hemorrhage, unspecified gastritis, unspecified gastrointestinal hemorrhage type [K92.2]    Principal Problem:   Melena Active Problems:   Essential hypertension   Morbid obesity due to excess calories ([facility])   Uncontrolled type 2 diabetes mellitus with diabetic polyneuropathy, without long-term current use of insulin ([facility])   Chronic pain syndrome   CKD (chronic kidney disease), stage III   Syncope   Fatty liver      Past Medical History:  Diagnosis Date  . Asthma   . Degenerative joint disease   . Diabetes mellitus   . GERD (gastroesophageal reflux disease)   . Hyperlipidemia    no longer on medication for this  . Hypertension    actually taking for DM renal protection, not DM  . Neuropathy ([place])    bilateral hands and feet  . Sickle cell trait ([facility])   . Sleep apnea   . stomach ca dx'd 2010   chemo/xrt comp 05/2009    Past Surgical History:  Procedure Laterality Date  . APPENDECTOMY    . CESAREAN SECTION    . COLON SURGERY    . ESOPHAGOGASTRODUODENOSCOPY N/A 10/15/2012   Procedure: ESOPHAGOGASTRODUODENOSCOPY (EGD);  Surgeon: Lear Ng, MD;  Location: [facility];  Service: Endoscopy;  Laterality: N/A;  .  ESOPHAGOGASTRODUODENOSCOPY N/A 01/15/2016   Procedure: ESOPHAGOGASTRODUODENOSCOPY (EGD);  Surgeon: Ronald Lobo, MD;  Location: [facility];  Service: Endoscopy;  Laterality: N/A;  . HERNIA REPAIR    . SHOULDER SURGERY     Left  . TOTAL HIP ARTHROPLASTY     Right  . TOTAL KNEE ARTHROPLASTY     Left       History of present illness and  Hospital Course:     Kindly see H&P for history of present illness and admission details, please review complete Labs, Consult reports and Test reports for all details in brief  HPI  from the history and physical done on the day of admission 03/24/2016 HPI: [name] is a 65 y.o. female with a past medical history significant for chronic pain on daily oral morphine, CKD III, NIDDM with neuropathy, HTN, NASH, hx of stomach cancer s/p partial gastrectomy, depression and recent chronic abdominal pain and vomiting who presents with melena.  The patient's recent history (which is familiar to Dr. Michail Sermon) is summarized below.  Over the last 24 hours: -The patient was seen at Dr. Kathline Magic office yesterday (Tuesday) for colonoscopy follow up from  her hospitalization in June for abdominal pain (I believe).   -Had a severe spasm of abdominal pain at that point and he was concerned for SBO and sent to ER.  -In ER, CT of the abdomen and pelvis with contrast showed nothing. -Case was discussed with on-call GI and hospitalist, who arranged for outpatient gastric emptying study.  After coming home from ER this morning, the patient had several bowel movements, three of which were black "not all black, but partly black".  She had no emesis or hematemesis or coffee ground emesis.  She had no hematochezia.  She called Dr. Kathline Magic office, who recommended she be evaluated in the ER.  ED course: -Temp 99.65F, heart rate 100s --> 80s, respirations, blood pressure, and pulse oximetry normal -Na 142, K 3.8, BUN normal, Cr 1.5 (baseline 1.5-1.8), WBC 6.7 K, Hgb 13.2,  unchanged from previous, AST 79 -On rectal exam she had no melena or blood, but FOBT testing was positive -The case was discussed with equal GI on call who recommended evaluation by Dr. Michail Sermon in the morning  She does not take a blood thinner.  She takes no NSAIDs (because "I've been told not to take ibuprofen and Aleve").  She has been given ketorolac IV once by her PCP in the last week, per records.  She does not use alcohol.  She has never had a GI bleed before, she thinks.  She has never had an ulcer before and I see no record of either.  She was admitted in June for abdominal pain, nausea, intractable vomiting, and weight loss.  EGD at that time by Dr. Cristina Gong showed no gastric outlet obstruction, normal appearing stomach and esophageal mucosa, no evidence of cancer, and only a 1cm hiatal hernia.  CT angiogram of the abdomen was unremarkable.  US of the abdomen was unremarkable.  Antispasmodics and metoclopramide were added at that time.  In the interim, she was also admitted once for syncope in July, while at her PCP's office.  Echocardiogram, CT head, ECG and telemetry at that time were normal and her HCTZ was stopped and she improved with hydration.    Hospital Course  65 year old female with past medical history of chronic pain, multiple hospital visits due to abdominal pain, followed by Dr. Michail Sermon as an outpatient, recent endoscopy with no acute findings, she presents with Hemoccult-positive stool, seen by GI.  Dark stool /Hemoccult-positive stool - Patient is Hemoccult positive, GI input greatly appreciated, no evidence of significant GI bleed, recent endoscopy with no acute findings, mild drop in hemoglobin most likely delusional, agent had upper GI series, with no acute abnormalities, no evidence of ulcers, creatinine remained stable during hospital stay, rating soft diet, workup can be done as an outpatient including colonoscopy.  Syncope No previous history of heart disease  or myocardial infarction. Current episode occurring under stress and poor PO intake. Recently normal echocardiogram and normal ECG. - Fluids - No further workup indicated  NIDDM - Resume home medication  Chronic pain from neuropathy: - With pain medicine physician as an outpatient, continue with home meds on discharge  CKD III -Stable   HTN Normotensive. - Continue carvedilol   Elevated transaminase: - From hepatic steatosis, presumed NASH.  Chronic abdominal pain and nausea/vomiting: -Continue Bentyl, noted on Carafate  Depression: She has a history of depressive episodes per her chart. She is currently not on therapy.    Discharge Condition:  Stable    Follow UP  Follow-up Information    [facility] .   Why:  medication management Contact information: [place] [id number] [phone]        Yvonne R Lowne Chase, DO .   Specialty:  Family Medicine Contact information: [address] [place] [address] [phone]             Discharge Instructions  and  Discharge Medications     Discharge Instructions    Discharge instructions    Complete by:  As directed   Follow with Primary MD Ann Held, DO in 7 days   Get CBC, CMP,checked  by Primary MD next visit.    Activity: As tolerated with Full fall precautions use walker/cane & assistance as needed   Disposition Home    Diet: Heart Healthy  , with feeding assistance and aspiration precautions.  For Heart failure patients - Check your Weight same time everyday, if you gain over 2 pounds, or you develop in leg swelling, experience more shortness of breath or chest pain, call your Primary MD immediately. Follow Cardiac Low Salt Diet and 1.5 lit/day fluid restriction.   On your next visit with your primary care physician please Get Medicines reviewed and adjusted.   Please request your Prim.MD to go over all  Hospital Tests and Procedure/Radiological results at the follow up, please get all Hospital records sent to your Prim MD by signing hospital release before you go home.   If you experience worsening of your admission symptoms, develop shortness of breath, life threatening emergency, suicidal or homicidal thoughts you must seek medical attention immediately by calling 911 or calling your MD immediately  if symptoms less severe.  You Must read complete instructions/literature along with all the possible adverse reactions/side effects for all the Medicines you take and that have been prescribed to you. Take any new Medicines after you have completely understood and accpet all the possible adverse reactions/side effects.   Do not drive, operating heavy machinery, perform activities at heights, swimming or participation in water activities or provide baby sitting services if your were admitted for syncope or siezures until you have seen by Primary MD or a Neurologist and advised to do so again.  Do not drive when taking Pain medications.    Do not take more than prescribed Pain, Sleep and Anxiety Medications  Special Instructions: If you have smoked or chewed Tobacco  in the last 2 yrs please stop smoking, stop any regular Alcohol  and or any Recreational drug use.  Wear Seat belts while driving.   Please note  You were cared for by a hospitalist during your hospital stay. If you have any questions about your discharge medications or the care you received while you were in the hospital after you are discharged, you can call the unit and asked to speak with the hospitalist on call if the hospitalist that took care of you is not available. Once you are discharged, your primary care physician will handle any further medical issues. Please note that NO REFILLS for any discharge medications will be authorized once you are discharged, as it is imperative that you return to your primary care physician (or  establish a relationship with a primary care physician if you do not have one) for your aftercare needs so that they can reassess your need for medications and monitor your lab values.   Increase activity slowly    Complete by:  As directed       Medication  List    TAKE these medications   acetic acid 2 % otic solution Commonly known as:  VOSOL Place 4 drops into both ears 3 (three) times daily.   aspirin 81 MG tablet Take 81 mg by mouth at bedtime.   B-D ASSURE BPM/MANUAL ARM CUFF Misc Check blood pressure daily.   carvedilol 3.125 MG tablet Commonly known as:  COREG Take 1 tablet (3.125 mg total) by mouth 2 (two) times daily with a meal.   dicyclomine 20 MG tablet Commonly known as:  BENTYL Take 1 tablet (20 mg total) by mouth 4 (four) times daily -  before meals and at bedtime.   EMBEDA 50-2 MG Cpcr Generic drug:  Morphine-Naltrexone Take 1 capsule by mouth daily.   glimepiride 4 MG tablet Commonly known as:  AMARYL Take 1 tablet (4 mg total) by mouth 2 (two) times daily.   HYDROcodone-acetaminophen 5-325 MG tablet Commonly known as:  NORCO/VICODIN Take 1 tablet by mouth every 6 (six) hours as needed. What changed:  reasons to take this   metFORMIN 850 MG tablet Commonly known as:  GLUCOPHAGE Take 1 tablet (850 mg total) by mouth 2 (two) times daily. Repeat labs are due now   morphine 15 MG tablet Commonly known as:  MSIR Take 15 mg three times daily as needed for pain   multivitamin with minerals Tabs tablet Take 1 tablet by mouth daily.   NONFORMULARY OR COMPOUNDED ITEM IVF NSS 1 Liter over 1 hour   ondansetron 8 MG disintegrating tablet Commonly known as:  ZOFRAN-ODT Take 1 tablet (8 mg total) by mouth every 8 (eight) hours as needed for nausea or vomiting.   ONE TOUCH ULTRA 2 w/Device Kit   ONE TOUCH ULTRA TEST test strip Generic drug:  glucose blood   OT ULTRA/FASTTK CNTRL SOLN Soln   sucralfate 1 g tablet Commonly known as:  CARAFATE Take 1  tablet (1 g total) by mouth 4 (four) times daily -  with meals and at bedtime.   traMADol 50 MG tablet Commonly known as:  ULTRAM Take 1 tablet (50 mg total) by mouth every 6 (six) hours as needed for severe pain.         Diet and Activity recommendation: See Discharge Instructions above   Consults obtained -  GI   Major procedures and Radiology Reports - PLEASE review detailed and final reports for all details, in brief -    Ct Abdomen Pelvis Wo Contrast  Result Date: 03/22/2016 CLINICAL DATA:  Left-sided abdominal pain. EXAM: CT ABDOMEN AND PELVIS WITHOUT CONTRAST TECHNIQUE: Multidetector CT imaging of the abdomen and pelvis was performed following the standard protocol without IV contrast. COMPARISON:  CT abdomen is dated 02/27/2016, 01/13/2016 and 01/04/2016. FINDINGS: Lower chest:  No acute findings. Hepatobiliary: Liver is diffusely low in density consistent with fatty infiltration. Gallbladder is unremarkable. Pancreas: Atrophic. Spleen: Within normal limits in size. Adrenals/Urinary Tract: Adrenal glands appear normal. Kidneys are unremarkable without mass, stone or hydronephrosis. No perinephric inflammation. No ureteral or bladder calculi identified. Bladder appears normal, although partially obscured by metallic artifact emanating from patient's right hip prosthesis. Stomach/Bowel: Surgical changes of partial gastrectomy and gastrojejunostomy. No obstruction, fluid collection or other surgical complicating feature in this area. No small or large bowel dilatation. No bowel wall thickening or evidence of bowel wall inflammation seen. Appendix is not seen, presumed appendectomy. Mild diverticulosis of the sigmoid colon without evidence of acute diverticulitis. Vascular/Lymphatic: Scattered atherosclerotic changes of the normal caliber abdominal aorta. No pathologic  appearing lymph nodes. Reproductive: No mass or other significant abnormality. Other: No free fluid or abscess collection  identified. No free intraperitoneal air. Musculoskeletal: Right hip arthroplasty hardware appears intact and appropriately positioned. Degenerative changes seen throughout the thoracolumbar spine, mild to moderate in degree. No acute or suspicious osseous finding. Superficial soft tissues are unremarkable. IMPRESSION: 1. No acute findings in the abdomen or pelvis. 2. Postsurgical changes of partial gastrectomy and gastrojejunostomy. No evidence of surgical complicating feature. No bowel obstruction or evidence of bowel wall inflammation. 3. Severe hepatic steatosis, stable. 4. Mild colonic diverticulosis without evidence of acute diverticulitis. 5. Aortic atherosclerosis. 6. Additional chronic/incidental findings detailed above. Electronically Signed   By: Franki Cabot M.D.   On: 03/22/2016 18:20   Ct Abdomen Pelvis W Contrast  Result Date: 02/27/2016 CLINICAL DATA:  Generalized diffuse abdominal pain. Nausea. Symptoms for 3 days. EXAM: CT ABDOMEN AND PELVIS WITH CONTRAST TECHNIQUE: Multidetector CT imaging of the abdomen and pelvis was performed using the standard protocol following bolus administration of intravenous contrast. CONTRAST:  25m ISOVUE-300 IOPAMIDOL (ISOVUE-300) INJECTION 61% COMPARISON:  Most recent CT comparison 01/13/2016 FINDINGS: Lower chest: Chronic scarring medial left lower lobe. No pleural fluid. Liver: Severe steatosis.  No evidence of focal lesion. Hepatobiliary: Gallbladder physiologically distended, no calcified stone. No biliary dilatation. Pancreas: Pancreatic atrophy.  No ductal dilatation or inflammation. Spleen: Normal. Adrenal glands: No nodule. Kidneys: Left upper pole renal scarring with decreased profusion is unchanged from prior. There is no hydronephrosis or perinephric edema. Ureters are decompressed. Stomach/Bowel: Small hiatal hernia. Stomach physiologically distended. Postsurgical change post partial gastrectomy. No obvious gastric mass by CT. Questioning  gastrojejunostomy. There are no dilated or thickened small bowel loops. No bowel obstruction, enteric contrast reaches the descending colon. Short segment of colonic wall thickening involving the mid transverse colon with minimal adjacent edema. Diverticulosis of the descending colon without acute diverticulitis. Post appendectomy. Vascular/Lymphatic: No retroperitoneal adenopathy. Abdominal aorta is normal in caliber. Atherosclerosis without aneurysm. Mesenteric vessels are patent. Reproductive: Uterus and adnexa are normal for age. Lower most pelvis partially obscured by streak artifact from right hip arthroplasty. Bladder: Obscured by streak artifact from right hip arthroplasty. Other: No free air, free fluid, or intra-abdominal fluid collection. Postsurgical change of the ventral upper abdominal wall, stable in appearance from prior. Musculoskeletal: There are no acute or suspicious osseous abnormalities. Stable degenerative change in the lumbar spine with primarily facet arthropathy. Minimal anterolisthesis of L4 on L5 is again seen. Right hip arthroplasty. IMPRESSION: 1. Mild wall thickening adjacent edema of the mid transverse colon. This may be infectious or inflammatory. Neoplastic etiology is not excluded, and would be better assessed with direct visualization with colonoscopy. 2. Otherwise no acute abnormality in the abdomen/pelvis. Stable findings of severe hepatic steatosis, small hiatal hernia, distal colonic diverticulosis, partial gastrectomy and left renal scarring. Electronically Signed   By: MJeb LeveringM.D.   On: 02/27/2016 21:54   Dg Ugi W/high Density W/kub  Result Date: 03/25/2016 CLINICAL DATA:  Post partial gastrectomy for gastric carcinoma. Persistent LEFT-sided pain with nausea and diarrhea EXAM: UPPER GI SERIES WITH KUB TECHNIQUE: After obtaining a scout radiograph a routine upper GI series was performed using density with an and high density. FLUOROSCOPY TIME:  Fluoroscopy Time:   1 minutes 38 seconds Radiation Exposure Index (if provided by the fluoroscopic device): 93.0 mGy Number of Acquired Spot Images: 19 COMPARISON:  CT 03/22/2016 FINDINGS: Initial KUB demonstrates no bowel obstruction. Distal esophagus is normal. No mass, stricture, or obstruction  in the stomach. Normal mucosal pattern. Post partial gastrectomy with gastroenteric anastomosis. No obstruction or inflammation at anastomosis. The proximal duodenum is normal. IMPRESSION: No mucosal irregularity, stricture or mass within the esophagus, stomach, duodenum. 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 Electronically Signed   By: Suzy Bouchard M.D.   On: 03/25/2016 12:33    Micro Results   No results found for this or any previous visit (from the past 240 hour(s)).     Today   Subjective:   Oveta Idris today has no headache,no chest  pain,no new weakness tingling or numbness,Reports her abdominal pain persisted, chronic at baseline for last few month  Objective:   Blood pressure 90/61, pulse 72, temperature 98 F (36.7 C), temperature source Oral, resp. rate 16, height _0  (1.702 m), weight 95.3 kg (210 lb), SpO2 98 %.   Intake/Output Summary (Last 24 hours) at 03/25/16 1304 Last data filed at 03/24/16 2100  Gross per 24 hour  Intake             1100 ml  Output                0 ml  Net             1100 ml    Exam Awake Alert, Oriented x 3 Supple Neck,No JVD,  Symmetrical Chest wall movement, Good air movement bilaterally, CTAB RRR,No Gallops,Rubs or new Murmurs, No Parasternal Heave +ve B.Sounds, Abd Soft, Non tender,  No rebound -guarding or rigidity. No Cyanosis, Clubbing or edema, No new Rash or bruise  Data Review   CBC w Diff: Lab Results  Component Value Date   WBC 8.0 03/25/2016   HGB 11.3 (L) 03/25/2016   HGB 12.1 02/12/2016   HGB 13.0 11/14/2013   HCT 31.6 (L) 03/25/2016   HCT 34.8 02/12/2016   HCT 39.6 11/14/2013   PLT 259 03/25/2016   PLT 287 02/12/2016    PLT 360 11/14/2013   LYMPHOPCT 19 03/15/2016   LYMPHOPCT 40.5 02/12/2016   LYMPHOPCT 35.6 11/14/2013   MONOPCT 8 03/15/2016   MONOPCT 11.8 02/12/2016   MONOPCT 9.8 11/14/2013   EOSPCT 3 03/15/2016   EOSPCT 6.8 02/12/2016   EOSPCT 6.7 11/14/2013   BASOPCT 0 03/15/2016   BASOPCT 0.5 02/12/2016   BASOPCT 0.6 11/14/2013    CMP: Lab Results  Component Value Date   NA 145 03/24/2016   NA 142 02/12/2016   K 3.7 03/24/2016   K 4.5 02/12/2016   CL 115 (H) 03/24/2016   CL 102 09/25/2014   CO2 23 03/24/2016   CO2 24 02/12/2016   BUN 10 03/24/2016   BUN 20.8 02/12/2016   CREATININE 1.38 (H) 03/24/2016   CREATININE 1.6 (H) 02/12/2016   PROT 5.7 (L) 03/24/2016   PROT 6.3 (L) 02/12/2016   ALBUMIN 2.4 (L) 03/24/2016   ALBUMIN 2.8 (L) 02/12/2016   BILITOT 1.0 03/24/2016   BILITOT 0.49 02/12/2016   ALKPHOS 62 03/24/2016   ALKPHOS 71 02/12/2016   AST 68 (H) 03/24/2016   AST 31 02/12/2016   ALT 34 03/24/2016   ALT 21 02/12/2016  .   Total Time in preparing paper work, data evaluation and todays exam - 35 minutes  Alexavier Tsutsui M.D on 03/25/2016 at 1:04 PM  [facility]   Office  [phone]

## 2016-03-25 NOTE — Progress Notes (Signed)
Subjective: Abdominal pain persists.  Objective: Vital signs in last 24 hours: Temp:  [98 F (36.7 C)-98.9 F (37.2 C)] 98 F (36.7 C) (09/01 0522) Pulse Rate:  [72-90] 72 (09/01 0522) Resp:  [16-18] 16 (09/01 0522) BP: (90-108)/(61-73) 90/61 (09/01 0522) SpO2:  [96 %-98 %] 98 % (09/01 0522) Weight change:  Last BM Date: 03/23/16  PE: GEN:  Overweight, NAD  Lab Results: CBC    Component Value Date/Time   WBC 8.0 03/25/2016 0436   RBC 3.72 (L) 03/25/2016 0436   HGB 11.3 (L) 03/25/2016 0436   HGB 12.1 02/12/2016 0928   HGB 13.0 11/14/2013 0806   HCT 31.6 (L) 03/25/2016 0436   HCT 34.8 02/12/2016 0928   HCT 39.6 11/14/2013 0806   PLT 259 03/25/2016 0436   PLT 287 02/12/2016 0928   PLT 360 11/14/2013 0806   MCV 84.9 03/25/2016 0436   MCV 88 02/12/2016 0928   MCV 86.5 11/14/2013 0806   MCH 30.4 03/25/2016 0436   MCHC 35.8 03/25/2016 0436   RDW 15.2 03/25/2016 0436   RDW 15.8 (H) 02/12/2016 0928   RDW 15.5 (H) 11/14/2013 0806   LYMPHSABS 1.4 03/15/2016 1735   LYMPHSABS 3.1 02/12/2016 0928   LYMPHSABS 2.5 11/14/2013 0806   MONOABS 0.6 03/15/2016 1735   MONOABS 0.7 11/14/2013 0806   EOSABS 0.2 03/15/2016 1735   EOSABS 0.5 02/12/2016 0928   BASOSABS 0.0 03/15/2016 1735   BASOSABS 0.0 02/12/2016 0928   BASOSABS 0.0 11/14/2013 0806   CMP     Component Value Date/Time   NA 145 03/24/2016 0502   NA 142 02/12/2016 0928   K 3.7 03/24/2016 0502   K 4.5 02/12/2016 0928   CL 115 (H) 03/24/2016 0502   CL 102 09/25/2014 0933   CO2 23 03/24/2016 0502   CO2 24 02/12/2016 0928   GLUCOSE 94 03/24/2016 0502   GLUCOSE 97 02/12/2016 0928   GLUCOSE 157 (H) 09/25/2014 0933   BUN 10 03/24/2016 0502   BUN 20.8 02/12/2016 0928   CREATININE 1.38 (H) 03/24/2016 0502   CREATININE 1.6 (H) 02/12/2016 0928   CALCIUM 8.3 (L) 03/24/2016 0502   CALCIUM 8.6 02/12/2016 0928   PROT 5.7 (L) 03/24/2016 0502   PROT 6.3 (L) 02/12/2016 0928   ALBUMIN 2.4 (L) 03/24/2016 0502   ALBUMIN 2.8  (L) 02/12/2016 0928   AST 68 (H) 03/24/2016 0502   AST 31 02/12/2016 0928   ALT 34 03/24/2016 0502   ALT 21 02/12/2016 0928   ALKPHOS 62 03/24/2016 0502   ALKPHOS 71 02/12/2016 0928   BILITOT 1.0 03/24/2016 0502   BILITOT 0.49 02/12/2016 0928   GFRNONAA 39 (L) 03/24/2016 0502   GFRAA 46 (L) 03/24/2016 0502   Assessment:  1.  Abdominal pain, left-sided, ongoing for few months.  Negative endoscopy 2 months ago, negative CT scans, negative labs.  I am not convinced this is GI tract in origin, as it is constant, no abnormalities on labs/imaging, and no exacerbation of symptoms with eating, no improvement with defecation.  Might be musculoskeletal or neuropathic pain. 2.  Dark stools, hemoccult-positive.  No evidence of ongoing overt GI bleeding.  Normal BUN. 3.  Anemia, mild, likely dilutional, doubt related to GI bleeding. 4.  Remote history of gastric cancer with subtotal gastrectomy.  No evidence of recurrence on recent endoscopy.  Plan:  1.  Continue sucralfate and dicyclomine. 2.  UGI today (didn't have yesterday due to consuming a couple ice chips per nursing). 3.  If UGI  series is unrevealing, next step would be colonoscopy, inpatient versus outpatient. 4.  If UGI and colonoscopy are unrevealing, I doubt there is much more to do from GI perspective, and would at that point suspect non GI tract source of abdominal pain. 5.  Will follow.   Landry Dyke 03/25/2016, 8:24 AM   Pager [phone] If no answer or after 5 PM call [phone]

## 2016-03-26 DIAGNOSIS — Z7984 Long term (current) use of oral hypoglycemic drugs: Secondary | ICD-10-CM | POA: Diagnosis not present

## 2016-03-26 DIAGNOSIS — K760 Fatty (change of) liver, not elsewhere classified: Secondary | ICD-10-CM | POA: Diagnosis not present

## 2016-03-26 DIAGNOSIS — K922 Gastrointestinal hemorrhage, unspecified: Secondary | ICD-10-CM | POA: Diagnosis not present

## 2016-03-26 DIAGNOSIS — N183 Chronic kidney disease, stage 3 (moderate): Secondary | ICD-10-CM | POA: Diagnosis not present

## 2016-03-26 DIAGNOSIS — G894 Chronic pain syndrome: Secondary | ICD-10-CM | POA: Diagnosis not present

## 2016-03-26 DIAGNOSIS — E1142 Type 2 diabetes mellitus with diabetic polyneuropathy: Secondary | ICD-10-CM | POA: Diagnosis not present

## 2016-03-26 DIAGNOSIS — I129 Hypertensive chronic kidney disease with stage 1 through stage 4 chronic kidney disease, or unspecified chronic kidney disease: Secondary | ICD-10-CM | POA: Diagnosis not present

## 2016-03-26 DIAGNOSIS — G4730 Sleep apnea, unspecified: Secondary | ICD-10-CM | POA: Diagnosis not present

## 2016-03-26 DIAGNOSIS — K219 Gastro-esophageal reflux disease without esophagitis: Secondary | ICD-10-CM | POA: Diagnosis not present

## 2016-03-26 DIAGNOSIS — J45909 Unspecified asthma, uncomplicated: Secondary | ICD-10-CM | POA: Diagnosis not present

## 2016-03-26 DIAGNOSIS — E1122 Type 2 diabetes mellitus with diabetic chronic kidney disease: Secondary | ICD-10-CM | POA: Diagnosis not present

## 2016-03-30 DIAGNOSIS — J45909 Unspecified asthma, uncomplicated: Secondary | ICD-10-CM | POA: Diagnosis not present

## 2016-03-30 DIAGNOSIS — K760 Fatty (change of) liver, not elsewhere classified: Secondary | ICD-10-CM | POA: Diagnosis not present

## 2016-03-30 DIAGNOSIS — N183 Chronic kidney disease, stage 3 (moderate): Secondary | ICD-10-CM | POA: Diagnosis not present

## 2016-03-30 DIAGNOSIS — Z7984 Long term (current) use of oral hypoglycemic drugs: Secondary | ICD-10-CM | POA: Diagnosis not present

## 2016-03-30 DIAGNOSIS — E1142 Type 2 diabetes mellitus with diabetic polyneuropathy: Secondary | ICD-10-CM | POA: Diagnosis not present

## 2016-03-30 DIAGNOSIS — G894 Chronic pain syndrome: Secondary | ICD-10-CM | POA: Diagnosis not present

## 2016-03-30 DIAGNOSIS — K219 Gastro-esophageal reflux disease without esophagitis: Secondary | ICD-10-CM | POA: Diagnosis not present

## 2016-03-30 DIAGNOSIS — I129 Hypertensive chronic kidney disease with stage 1 through stage 4 chronic kidney disease, or unspecified chronic kidney disease: Secondary | ICD-10-CM | POA: Diagnosis not present

## 2016-03-30 DIAGNOSIS — G4730 Sleep apnea, unspecified: Secondary | ICD-10-CM | POA: Diagnosis not present

## 2016-03-30 DIAGNOSIS — K922 Gastrointestinal hemorrhage, unspecified: Secondary | ICD-10-CM | POA: Diagnosis not present

## 2016-03-30 DIAGNOSIS — E1122 Type 2 diabetes mellitus with diabetic chronic kidney disease: Secondary | ICD-10-CM | POA: Diagnosis not present

## 2016-04-01 ENCOUNTER — Ambulatory Visit (HOSPITAL_BASED_OUTPATIENT_CLINIC_OR_DEPARTMENT_OTHER): Payer: Medicare Other

## 2016-04-01 ENCOUNTER — Other Ambulatory Visit: Payer: Self-pay

## 2016-04-01 ENCOUNTER — Other Ambulatory Visit (HOSPITAL_BASED_OUTPATIENT_CLINIC_OR_DEPARTMENT_OTHER): Payer: Medicare Other

## 2016-04-01 ENCOUNTER — Ambulatory Visit (HOSPITAL_BASED_OUTPATIENT_CLINIC_OR_DEPARTMENT_OTHER): Payer: Medicare Other | Admitting: Hematology & Oncology

## 2016-04-01 ENCOUNTER — Ambulatory Visit: Payer: Medicare Other

## 2016-04-01 ENCOUNTER — Inpatient Hospital Stay: Payer: Self-pay | Admitting: Family Medicine

## 2016-04-01 VITALS — BP 117/100 | HR 89 | Temp 97.6°F | Resp 20 | Wt 204.0 lb

## 2016-04-01 DIAGNOSIS — G894 Chronic pain syndrome: Secondary | ICD-10-CM | POA: Diagnosis not present

## 2016-04-01 DIAGNOSIS — I950 Idiopathic hypotension: Secondary | ICD-10-CM

## 2016-04-01 DIAGNOSIS — K219 Gastro-esophageal reflux disease without esophagitis: Secondary | ICD-10-CM

## 2016-04-01 DIAGNOSIS — R634 Abnormal weight loss: Secondary | ICD-10-CM

## 2016-04-01 DIAGNOSIS — E042 Nontoxic multinodular goiter: Secondary | ICD-10-CM

## 2016-04-01 DIAGNOSIS — D509 Iron deficiency anemia, unspecified: Secondary | ICD-10-CM

## 2016-04-01 DIAGNOSIS — Z85028 Personal history of other malignant neoplasm of stomach: Secondary | ICD-10-CM

## 2016-04-01 DIAGNOSIS — C169 Malignant neoplasm of stomach, unspecified: Secondary | ICD-10-CM

## 2016-04-01 DIAGNOSIS — K589 Irritable bowel syndrome without diarrhea: Secondary | ICD-10-CM

## 2016-04-01 DIAGNOSIS — R109 Unspecified abdominal pain: Secondary | ICD-10-CM

## 2016-04-01 DIAGNOSIS — C162 Malignant neoplasm of body of stomach: Secondary | ICD-10-CM

## 2016-04-01 DIAGNOSIS — E785 Hyperlipidemia, unspecified: Secondary | ICD-10-CM

## 2016-04-01 LAB — IRON AND TIBC
%SAT: 68 % — ABNORMAL HIGH (ref 21–57)
IRON: 75 ug/dL (ref 41–142)
TIBC: 110 ug/dL — AB (ref 236–444)
UIBC: 35 ug/dL — ABNORMAL LOW (ref 120–384)

## 2016-04-01 LAB — CBC WITH DIFFERENTIAL (CANCER CENTER ONLY)
BASO#: 0 10*3/uL (ref 0.0–0.2)
BASO%: 0.1 % (ref 0.0–2.0)
EOS%: 2.3 % (ref 0.0–7.0)
Eosinophils Absolute: 0.2 10*3/uL (ref 0.0–0.5)
HEMATOCRIT: 36.9 % (ref 34.8–46.6)
HGB: 13.4 g/dL (ref 11.6–15.9)
LYMPH#: 2 10*3/uL (ref 0.9–3.3)
LYMPH%: 27.9 % (ref 14.0–48.0)
MCH: 30.9 pg (ref 26.0–34.0)
MCHC: 36.3 g/dL — ABNORMAL HIGH (ref 32.0–36.0)
MCV: 85 fL (ref 81–101)
MONO#: 0.6 10*3/uL (ref 0.1–0.9)
MONO%: 7.8 % (ref 0.0–13.0)
NEUT%: 61.9 % (ref 39.6–80.0)
NEUTROS ABS: 4.5 10*3/uL (ref 1.5–6.5)
PLATELETS: 275 10*3/uL (ref 145–400)
RBC: 4.33 10*6/uL (ref 3.70–5.32)
RDW: 15.2 % (ref 11.1–15.7)
WBC: 7.3 10*3/uL (ref 3.9–10.0)

## 2016-04-01 LAB — FERRITIN: FERRITIN: 873 ng/mL — AB (ref 9–269)

## 2016-04-01 LAB — COMPREHENSIVE METABOLIC PANEL
ALBUMIN: 2.5 g/dL — AB (ref 3.5–5.0)
ALK PHOS: 87 U/L (ref 40–150)
ALT: 33 U/L (ref 0–55)
ANION GAP: 14 meq/L — AB (ref 3–11)
AST: 58 U/L — ABNORMAL HIGH (ref 5–34)
BILIRUBIN TOTAL: 0.67 mg/dL (ref 0.20–1.20)
BUN: 7.5 mg/dL (ref 7.0–26.0)
CALCIUM: 9.1 mg/dL (ref 8.4–10.4)
CO2: 23 mEq/L (ref 22–29)
Chloride: 108 mEq/L (ref 98–109)
Creatinine: 1.3 mg/dL — ABNORMAL HIGH (ref 0.6–1.1)
EGFR: 53 mL/min/{1.73_m2} — AB (ref >90)
Glucose: 56 mg/dl — ABNORMAL LOW (ref 70–140)
Potassium: 3.9 mEq/L (ref 3.5–5.1)
Sodium: 144 mEq/L (ref 136–145)
TOTAL PROTEIN: 6.6 g/dL (ref 6.4–8.3)

## 2016-04-01 LAB — TSH: TSH: 1.241 m[IU]/L (ref 0.308–3.960)

## 2016-04-01 MED ORDER — FAMOTIDINE IN NACL 20-0.9 MG/50ML-% IV SOLN
INTRAVENOUS | Status: AC
  Filled 2016-04-01: qty 100

## 2016-04-01 MED ORDER — TRAMADOL HCL 50 MG PO TABS: 50.0000 mg | ORAL_TABLET | Freq: Four times a day (QID) | ORAL | 0 refills | Status: DC | PRN

## 2016-04-01 MED ORDER — SODIUM CHLORIDE 0.9 % IJ SOLN
10.0000 mL | INTRAMUSCULAR | Status: DC | PRN
  Administered 2016-04-01: 10 mL via INTRAVENOUS
  Filled 2016-04-01: qty 10

## 2016-04-01 MED ORDER — SODIUM CHLORIDE 0.9 % IJ SOLN
10.0000 mL | INTRAMUSCULAR | Status: DC | PRN
  Filled 2016-04-01: qty 10

## 2016-04-01 MED ORDER — HEPARIN SOD (PORK) LOCK FLUSH 100 UNIT/ML IV SOLN
500.0000 [IU] | Freq: Once | INTRAVENOUS | Status: DC | PRN
  Filled 2016-04-01: qty 5

## 2016-04-01 MED ORDER — ALTEPLASE 2 MG IJ SOLR
2.0000 mg | Freq: Once | INTRAMUSCULAR | Status: DC | PRN
  Filled 2016-04-01: qty 2

## 2016-04-01 MED ORDER — SODIUM CHLORIDE 0.9 % IV SOLN
1000.0000 mL | INTRAVENOUS | Status: AC
  Administered 2016-04-01: 11:00:00 via INTRAVENOUS

## 2016-04-01 MED ORDER — FAMOTIDINE IN NACL 20-0.9 MG/50ML-% IV SOLN
40.0000 mg | Freq: Once | INTRAVENOUS | Status: AC
  Administered 2016-04-01: 40 mg via INTRAVENOUS

## 2016-04-01 MED ORDER — HEPARIN SOD (PORK) LOCK FLUSH 100 UNIT/ML IV SOLN
500.0000 [IU] | Freq: Once | INTRAVENOUS | Status: AC | PRN
  Administered 2016-04-01: 500 [IU] via INTRAVENOUS
  Filled 2016-04-01: qty 5

## 2016-04-01 NOTE — Patient Instructions (Signed)

## 2016-04-02 LAB — LIPASE: Lipase: 7 U/L (ref 0–59)

## 2016-04-02 LAB — AMYLASE: Amylase, Serum: 70 U/L (ref 31–124)

## 2016-04-04 DIAGNOSIS — E1122 Type 2 diabetes mellitus with diabetic chronic kidney disease: Secondary | ICD-10-CM | POA: Diagnosis not present

## 2016-04-04 DIAGNOSIS — G4730 Sleep apnea, unspecified: Secondary | ICD-10-CM | POA: Diagnosis not present

## 2016-04-04 DIAGNOSIS — E1142 Type 2 diabetes mellitus with diabetic polyneuropathy: Secondary | ICD-10-CM | POA: Diagnosis not present

## 2016-04-04 DIAGNOSIS — K219 Gastro-esophageal reflux disease without esophagitis: Secondary | ICD-10-CM | POA: Diagnosis not present

## 2016-04-04 DIAGNOSIS — N183 Chronic kidney disease, stage 3 (moderate): Secondary | ICD-10-CM | POA: Diagnosis not present

## 2016-04-04 DIAGNOSIS — I129 Hypertensive chronic kidney disease with stage 1 through stage 4 chronic kidney disease, or unspecified chronic kidney disease: Secondary | ICD-10-CM | POA: Diagnosis not present

## 2016-04-04 DIAGNOSIS — K760 Fatty (change of) liver, not elsewhere classified: Secondary | ICD-10-CM | POA: Diagnosis not present

## 2016-04-04 DIAGNOSIS — Z7984 Long term (current) use of oral hypoglycemic drugs: Secondary | ICD-10-CM | POA: Diagnosis not present

## 2016-04-04 DIAGNOSIS — J45909 Unspecified asthma, uncomplicated: Secondary | ICD-10-CM | POA: Diagnosis not present

## 2016-04-04 DIAGNOSIS — G894 Chronic pain syndrome: Secondary | ICD-10-CM | POA: Diagnosis not present

## 2016-04-04 DIAGNOSIS — K922 Gastrointestinal hemorrhage, unspecified: Secondary | ICD-10-CM | POA: Diagnosis not present

## 2016-04-04 NOTE — Progress Notes (Signed)
Hematology and Oncology Follow Up Visit  [name] [id number] 02-Aug-1950 65 y.o. 04/04/2016   Principle Diagnosis:   Stage IB (T1, N1, M0) adenocarcinoma of the stomach Non-insulin-dependent diabetes   Current Therapy:   Observation     Interim History:  [name] is having a little bit of a tough time. She was hospitalized recently. This is for abdominal pain. She had a thorough workup. She had a lot of studies done.so far, nothing can really be found.  She has lost quite a bit overweight.  We have not found any evidence of recurrent malignancy. It has now been over 8 or 9 years that she had the stomach cancer.  She does have quite a few other health issues. I know that her family doctor is trying to help out as much as possible.  She comes in a wheelchair today. She looks dehydrated.  We are checking her iron studies. They have been okay. Today, her ferritin is 873. Her iron saturation is 68%.  We are also checking her thyroid. Her TSH is 1.2. Para for blood sugar is on the low side. It is only 56.  She did have a CT of the abdomen of pelvis done on August 29. Nothing was specific. She has severe hepatic steatosis. She has some diverticulosis. There is no adenopathy.  She just is not all that hungry. She's had some nausea but no vomiting. There's been no diarrhea.   Overall, performance status is ECOG 2   Medications:  Current Outpatient Prescriptions:  .  acetic acid (VOSOL) 2 % otic solution, Place 4 drops into both ears 3 (three) times daily., Disp: 15 mL, Rfl: 0 .  aspirin 81 MG tablet, Take 81 mg by mouth at bedtime., Disp: , Rfl:  .  Blood Glucose Calibration (OT ULTRA/FASTTK CNTRL SOLN) SOLN, , Disp: , Rfl:  .  Blood Glucose Monitoring Suppl (ONE TOUCH ULTRA 2) w/Device KIT, , Disp: , Rfl:  .  Blood Pressure Monitoring (B-D ASSURE BPM/MANUAL ARM CUFF) MISC, Check blood pressure daily., Disp: 1 each, Rfl: 0 .  carvedilol (COREG) 3.125 MG tablet, Take 1 tablet (3.125  mg total) by mouth 2 (two) times daily with a meal., Disp: 60 tablet, Rfl: 3 .  dicyclomine (BENTYL) 20 MG tablet, Take 1 tablet (20 mg total) by mouth 4 (four) times daily -  before meals and at bedtime., Disp: 120 tablet, Rfl: 3 .  EMBEDA 50-2 MG CPCR, Take 1 capsule by mouth daily., Disp: , Rfl:  .  glimepiride (AMARYL) 4 MG tablet, Take 1 tablet (4 mg total) by mouth 2 (two) times daily., Disp: 180 tablet, Rfl: 1 .  HYDROcodone-acetaminophen (NORCO/VICODIN) 5-325 MG tablet, Take 1 tablet by mouth every 6 (six) hours as needed. (Patient taking differently: Take 1 tablet by mouth every 6 (six) hours as needed for moderate pain. ), Disp: 15 tablet, Rfl: 0 .  metFORMIN (GLUCOPHAGE) 850 MG tablet, Take 1 tablet (850 mg total) by mouth 2 (two) times daily. Repeat labs are due now, Disp: 180 tablet, Rfl: 1 .  Multiple Vitamin (MULTIVITAMIN WITH MINERALS) TABS tablet, Take 1 tablet by mouth daily., Disp: , Rfl:  .  NONFORMULARY OR COMPOUNDED ITEM, IVF NSS 1 Liter over 1 hour, Disp: 1 each, Rfl: 0 .  ondansetron (ZOFRAN-ODT) 8 MG disintegrating tablet, Take 1 tablet (8 mg total) by mouth every 8 (eight) hours as needed for nausea or vomiting., Disp: 20 tablet, Rfl: 0 .  ONE TOUCH ULTRA TEST  test strip, , Disp: , Rfl:  .  sucralfate (CARAFATE) 1 g tablet, Take 1 tablet (1 g total) by mouth 4 (four) times daily -  with meals and at bedtime., Disp: 120 tablet, Rfl: 0 .  traMADol (ULTRAM) 50 MG tablet, Take 1 tablet (50 mg total) by mouth every 6 (six) hours as needed for severe pain., Disp: 60 tablet, Rfl: 0  Allergies:  Allergies  Allergen Reactions  . Adhesive [Tape] Other (See Comments)    Burn Skin  . Codeine Other (See Comments)    paranoid  . Zocor [Simvastatin - High Dose] Other (See Comments)    Muscle spasms  . Penicillins Rash    Has patient had a PCN reaction causing immediate rash, facial/tongue/throat swelling, SOB or lightheadedness with hypotension: Yes Has patient had a PCN reaction  causing severe rash involving mucus membranes or skin necrosis: No Has patient had a PCN reaction that required hospitalization does not remember.  Has patient had a PCN reaction occurring within the last 10 years: No If all of the above answers are "NO", then may proceed with Cephalosporin use.     Past Medical History, Surgical history, Social history, and Family History were reviewed and updated.  Review of Systems: As above  Physical Exam:  weight is 204 lb (92.5 kg). Her oral temperature is 97.6 F (36.4 C). Her blood pressure is 117/100 (abnormal) and her pulse is 89. Her respiration is 20.   Obese African American female. Head and neck exam shows no ocular or oral lesions. She has no palpable cervical or supraclavicular lymph nodes. Lungs are clear. Cardiac exam regular rate and rhythm with no murmurs, rubs or bruits. Abdomen is soft. She is moderately obese. She has a well-healed laparotomy scar. There is no fluid wave. There is no palpable liver or spleen tip. Back exam shows no tenderness over the spine ribs or hips. Extremities shows no clubbing cyanosis or edema. Skin exam no rashes. Neurological exam is nonfocal.  Lab Results  Component Value Date   WBC 7.3 04/01/2016   HGB 13.4 04/01/2016   HCT 36.9 04/01/2016   MCV 85 04/01/2016   PLT 275 04/01/2016     Chemistry      Component Value Date/Time   NA 144 04/01/2016 0839   K 3.9 04/01/2016 0839   CL 115 (H) 03/24/2016 0502   CL 102 09/25/2014 0933   CO2 23 04/01/2016 0839   BUN 7.5 04/01/2016 0839   CREATININE 1.3 (H) 04/01/2016 0839      Component Value Date/Time   CALCIUM 9.1 04/01/2016 0839   ALKPHOS 87 04/01/2016 0839   AST 58 (H) 04/01/2016 0839   ALT 33 04/01/2016 0839   BILITOT 0.67 04/01/2016 0839     .   Impression and Plan: [name] is 65 year old African [place] female with stage IB stomach cancer. She underwent resection. She then had adjuvant radiation and chemotherapy. She completed this  about 8 years ago.  I'm not sure as was going on with her abdominal issues. She was hospitalized recently. She had a full workup.   For now, we will go ahead and give her some IV fluids. I'll make sure she gets a little bit of extra glucose.  Hopefully, this will get better. I just think that this is all related to her non-oncologic issues. She does have quite a few problems going on.   I'll plan to get her back in 6 weeks.   Volanda Napoleon, MD 9/11/20177:39 AM

## 2016-04-07 DIAGNOSIS — G4730 Sleep apnea, unspecified: Secondary | ICD-10-CM | POA: Diagnosis not present

## 2016-04-07 DIAGNOSIS — K219 Gastro-esophageal reflux disease without esophagitis: Secondary | ICD-10-CM | POA: Diagnosis not present

## 2016-04-07 DIAGNOSIS — Z7984 Long term (current) use of oral hypoglycemic drugs: Secondary | ICD-10-CM | POA: Diagnosis not present

## 2016-04-07 DIAGNOSIS — J45909 Unspecified asthma, uncomplicated: Secondary | ICD-10-CM | POA: Diagnosis not present

## 2016-04-07 DIAGNOSIS — I129 Hypertensive chronic kidney disease with stage 1 through stage 4 chronic kidney disease, or unspecified chronic kidney disease: Secondary | ICD-10-CM | POA: Diagnosis not present

## 2016-04-07 DIAGNOSIS — K922 Gastrointestinal hemorrhage, unspecified: Secondary | ICD-10-CM | POA: Diagnosis not present

## 2016-04-07 DIAGNOSIS — E1122 Type 2 diabetes mellitus with diabetic chronic kidney disease: Secondary | ICD-10-CM | POA: Diagnosis not present

## 2016-04-07 DIAGNOSIS — E1142 Type 2 diabetes mellitus with diabetic polyneuropathy: Secondary | ICD-10-CM | POA: Diagnosis not present

## 2016-04-07 DIAGNOSIS — G894 Chronic pain syndrome: Secondary | ICD-10-CM | POA: Diagnosis not present

## 2016-04-07 DIAGNOSIS — N183 Chronic kidney disease, stage 3 (moderate): Secondary | ICD-10-CM | POA: Diagnosis not present

## 2016-04-07 DIAGNOSIS — K760 Fatty (change of) liver, not elsewhere classified: Secondary | ICD-10-CM | POA: Diagnosis not present

## 2016-04-09 ENCOUNTER — Other Ambulatory Visit: Payer: Self-pay | Admitting: Family Medicine

## 2016-04-09 DIAGNOSIS — E1151 Type 2 diabetes mellitus with diabetic peripheral angiopathy without gangrene: Secondary | ICD-10-CM

## 2016-04-09 DIAGNOSIS — IMO0002 Reserved for concepts with insufficient information to code with codable children: Secondary | ICD-10-CM

## 2016-04-09 DIAGNOSIS — E1165 Type 2 diabetes mellitus with hyperglycemia: Principal | ICD-10-CM

## 2016-04-12 ENCOUNTER — Telehealth: Payer: Self-pay | Admitting: Family Medicine

## 2016-04-12 DIAGNOSIS — K219 Gastro-esophageal reflux disease without esophagitis: Secondary | ICD-10-CM | POA: Diagnosis not present

## 2016-04-12 DIAGNOSIS — N183 Chronic kidney disease, stage 3 (moderate): Secondary | ICD-10-CM | POA: Diagnosis not present

## 2016-04-12 DIAGNOSIS — Z7984 Long term (current) use of oral hypoglycemic drugs: Secondary | ICD-10-CM | POA: Diagnosis not present

## 2016-04-12 DIAGNOSIS — K922 Gastrointestinal hemorrhage, unspecified: Secondary | ICD-10-CM | POA: Diagnosis not present

## 2016-04-12 DIAGNOSIS — J45909 Unspecified asthma, uncomplicated: Secondary | ICD-10-CM | POA: Diagnosis not present

## 2016-04-12 DIAGNOSIS — I129 Hypertensive chronic kidney disease with stage 1 through stage 4 chronic kidney disease, or unspecified chronic kidney disease: Secondary | ICD-10-CM | POA: Diagnosis not present

## 2016-04-12 DIAGNOSIS — E1122 Type 2 diabetes mellitus with diabetic chronic kidney disease: Secondary | ICD-10-CM | POA: Diagnosis not present

## 2016-04-12 DIAGNOSIS — G4730 Sleep apnea, unspecified: Secondary | ICD-10-CM | POA: Diagnosis not present

## 2016-04-12 DIAGNOSIS — K760 Fatty (change of) liver, not elsewhere classified: Secondary | ICD-10-CM | POA: Diagnosis not present

## 2016-04-12 DIAGNOSIS — E1142 Type 2 diabetes mellitus with diabetic polyneuropathy: Secondary | ICD-10-CM | POA: Diagnosis not present

## 2016-04-12 DIAGNOSIS — G894 Chronic pain syndrome: Secondary | ICD-10-CM | POA: Diagnosis not present

## 2016-04-12 NOTE — Telephone Encounter (Signed)
How low was bp--- consider stopping coreg

## 2016-04-12 NOTE — Telephone Encounter (Signed)
Caller name:Meghan  Relation to pt: RN from [facility] Call back number: [phone]   Reason for call:  Wanted to inform PCP patient had a few falls on Saturday, decrease BP. complaining about nausea and hand and feet pain.

## 2016-04-12 NOTE — Telephone Encounter (Signed)
To MD to review.     KP

## 2016-04-13 NOTE — Telephone Encounter (Signed)
Message left on Megan's VM for get more details on Low BP.    KP

## 2016-04-14 ENCOUNTER — Telehealth: Payer: Self-pay | Admitting: Family Medicine

## 2016-04-14 DIAGNOSIS — E1142 Type 2 diabetes mellitus with diabetic polyneuropathy: Secondary | ICD-10-CM | POA: Diagnosis not present

## 2016-04-14 DIAGNOSIS — Z7984 Long term (current) use of oral hypoglycemic drugs: Secondary | ICD-10-CM | POA: Diagnosis not present

## 2016-04-14 DIAGNOSIS — K219 Gastro-esophageal reflux disease without esophagitis: Secondary | ICD-10-CM | POA: Diagnosis not present

## 2016-04-14 DIAGNOSIS — E1122 Type 2 diabetes mellitus with diabetic chronic kidney disease: Secondary | ICD-10-CM | POA: Diagnosis not present

## 2016-04-14 DIAGNOSIS — I129 Hypertensive chronic kidney disease with stage 1 through stage 4 chronic kidney disease, or unspecified chronic kidney disease: Secondary | ICD-10-CM | POA: Diagnosis not present

## 2016-04-14 DIAGNOSIS — K922 Gastrointestinal hemorrhage, unspecified: Secondary | ICD-10-CM | POA: Diagnosis not present

## 2016-04-14 DIAGNOSIS — N183 Chronic kidney disease, stage 3 (moderate): Secondary | ICD-10-CM | POA: Diagnosis not present

## 2016-04-14 DIAGNOSIS — J45909 Unspecified asthma, uncomplicated: Secondary | ICD-10-CM | POA: Diagnosis not present

## 2016-04-14 DIAGNOSIS — G894 Chronic pain syndrome: Secondary | ICD-10-CM | POA: Diagnosis not present

## 2016-04-14 DIAGNOSIS — G4730 Sleep apnea, unspecified: Secondary | ICD-10-CM | POA: Diagnosis not present

## 2016-04-14 DIAGNOSIS — K760 Fatty (change of) liver, not elsewhere classified: Secondary | ICD-10-CM | POA: Diagnosis not present

## 2016-04-14 NOTE — Telephone Encounter (Signed)
See phone for today 04/14/16 with BP results.    KP

## 2016-04-14 NOTE — Telephone Encounter (Signed)
Caller name: Jinny Blossom Relationship to patient: [place] Can be reached: [phone]  Reason for call: FYI: With patient and patient's BP 99/66 sitting. Patient has appt with provider tomorrow but wanted to report BP today.

## 2016-04-14 NOTE — Telephone Encounter (Signed)
Was coreg stopped

## 2016-04-15 ENCOUNTER — Inpatient Hospital Stay: Payer: Self-pay | Admitting: Family Medicine

## 2016-04-15 ENCOUNTER — Ambulatory Visit (INDEPENDENT_AMBULATORY_CARE_PROVIDER_SITE_OTHER): Payer: Medicare Other | Admitting: Family Medicine

## 2016-04-15 ENCOUNTER — Encounter: Payer: Self-pay | Admitting: Family Medicine

## 2016-04-15 VITALS — BP 90/64 | Temp 98.8°F | Resp 16 | Ht 67.0 in | Wt 201.0 lb

## 2016-04-15 DIAGNOSIS — F321 Major depressive disorder, single episode, moderate: Secondary | ICD-10-CM

## 2016-04-15 DIAGNOSIS — K922 Gastrointestinal hemorrhage, unspecified: Secondary | ICD-10-CM

## 2016-04-15 DIAGNOSIS — G894 Chronic pain syndrome: Secondary | ICD-10-CM | POA: Diagnosis not present

## 2016-04-15 DIAGNOSIS — R1084 Generalized abdominal pain: Secondary | ICD-10-CM

## 2016-04-15 DIAGNOSIS — F32A Depression, unspecified: Secondary | ICD-10-CM

## 2016-04-15 DIAGNOSIS — F329 Major depressive disorder, single episode, unspecified: Secondary | ICD-10-CM

## 2016-04-15 DIAGNOSIS — R109 Unspecified abdominal pain: Secondary | ICD-10-CM | POA: Diagnosis not present

## 2016-04-15 DIAGNOSIS — I10 Essential (primary) hypertension: Secondary | ICD-10-CM

## 2016-04-15 LAB — COMPREHENSIVE METABOLIC PANEL
ALK PHOS: 74 U/L (ref 39–117)
ALT: 29 U/L (ref 0–35)
AST: 47 U/L — ABNORMAL HIGH (ref 0–37)
Albumin: 2.8 g/dL — ABNORMAL LOW (ref 3.5–5.2)
BUN: 11 mg/dL (ref 6–23)
CO2: 25 mEq/L (ref 19–32)
Calcium: 8.8 mg/dL (ref 8.4–10.5)
Chloride: 105 mEq/L (ref 96–112)
Creatinine, Ser: 1.55 mg/dL — ABNORMAL HIGH (ref 0.40–1.20)
GFR: 43.28 mL/min — AB (ref >60.00)
GLUCOSE: 257 mg/dL — AB (ref 70–99)
POTASSIUM: 4.1 meq/L (ref 3.5–5.1)
Sodium: 141 mEq/L (ref 135–145)
TOTAL PROTEIN: 6.7 g/dL (ref 6.0–8.3)
Total Bilirubin: 0.8 mg/dL (ref 0.2–1.2)

## 2016-04-15 LAB — CBC WITH DIFFERENTIAL/PLATELET
BASOS PCT: 0.5 % (ref 0.0–3.0)
Basophils Absolute: 0 10*3/uL (ref 0.0–0.1)
EOS PCT: 3.5 % (ref 0.0–5.0)
Eosinophils Absolute: 0.3 10*3/uL (ref 0.0–0.7)
HCT: 38 % (ref 36.0–46.0)
Hemoglobin: 12.9 g/dL (ref 12.0–15.0)
LYMPHS ABS: 2.8 10*3/uL (ref 0.7–4.0)
Lymphocytes Relative: 34.5 % (ref 12.0–46.0)
MCHC: 33.8 g/dL (ref 30.0–36.0)
MCV: 91.5 fl (ref 78.0–100.0)
MONOS PCT: 8.3 % (ref 3.0–12.0)
Monocytes Absolute: 0.7 10*3/uL (ref 0.1–1.0)
NEUTROS ABS: 4.3 10*3/uL (ref 1.4–7.7)
NEUTROS PCT: 53.2 % (ref 43.0–77.0)
PLATELETS: 308 10*3/uL (ref 150.0–400.0)
RBC: 4.16 Mil/uL (ref 3.87–5.11)
RDW: 15.9 % — ABNORMAL HIGH (ref 11.5–15.5)
WBC: 8.1 10*3/uL (ref 4.0–10.5)

## 2016-04-15 MED ORDER — DULOXETINE HCL 60 MG PO CPEP: 60.0000 mg | ORAL_CAPSULE | Freq: Two times a day (BID) | ORAL | 1 refills | Status: DC

## 2016-04-15 NOTE — Assessment & Plan Note (Addendum)
Better but still with no appetite D/w pt getting protein powder or something that she can make herself since she can not have the boost et due to milk Upsetting her stomach F/u GI  con't bentyl

## 2016-04-15 NOTE — Assessment & Plan Note (Signed)
cymbalta restarted-- pt does not know why it was stopped in hospital

## 2016-04-15 NOTE — Progress Notes (Signed)
Pre visit review using our clinic review tool, if applicable. No additional management support is needed unless otherwise documented below in the visit note. 

## 2016-04-15 NOTE — Assessment & Plan Note (Signed)
Now running low Stop coreg con't to follow with Home health

## 2016-04-15 NOTE — Patient Instructions (Addendum)
Please call Dr Kathline Magic office today    Gastrointestinal Bleeding Gastrointestinal (GI) bleeding means there is bleeding somewhere along the digestive tract, between the mouth and anus. CAUSES  There are many different problems that can cause GI bleeding. Possible causes include:  Esophagitis. This is inflammation, irritation, or swelling of the esophagus.  Hemorrhoids.These are veins that are full of blood (engorged) in the rectum. They cause pain, inflammation, and may bleed.  Anal fissures.These are areas of painful tearing which may bleed. They are often caused by passing hard stool.  Diverticulosis.These are pouches that form on the colon over time, with age, and may bleed significantly.  Diverticulitis.This is inflammation in areas with diverticulosis. It can cause pain, fever, and bloody stools, although bleeding is rare.  Polyps and cancer. Colon cancer often starts out as precancerous polyps.  Gastritis and ulcers.Bleeding from the upper gastrointestinal tract (near the stomach) may travel through the intestines and produce black, sometimes tarry, often bad smelling stools. In certain cases, if the bleeding is fast enough, the stools may not be black, but red. This condition may be life-threatening. SYMPTOMS   Vomiting bright red blood or material that looks like coffee grounds.  Bloody, black, or tarry stools. DIAGNOSIS  Your caregiver may diagnose your condition by taking your history and performing a physical exam. More tests may be needed, including:  X-rays and other imaging tests.  Esophagogastroduodenoscopy (EGD). This test uses a flexible, lighted tube to look at your esophagus, stomach, and small intestine.  Colonoscopy. This test uses a flexible, lighted tube to look at your colon. TREATMENT  Treatment depends on the cause of your bleeding.   For bleeding from the esophagus, stomach, small intestine, or colon, the caregiver doing your EGD or colonoscopy  may be able to stop the bleeding as part of the procedure.  Inflammation or infection of the colon can be treated with medicines.  Many rectal problems can be treated with creams, suppositories, or warm baths.  Surgery is sometimes needed.  Blood transfusions are sometimes needed if you have lost a lot of blood. If bleeding is slow, you may be allowed to go home. If there is a lot of bleeding, you will need to stay in the hospital for observation. HOME CARE INSTRUCTIONS   Take any medicines exactly as prescribed.  Keep your stools soft by eating foods that are high in fiber. These foods include whole grains, legumes, fruits, and vegetables. Prunes (1 to 3 a day) work well for many people.  Drink enough fluids to keep your urine clear or pale yellow. SEEK IMMEDIATE MEDICAL CARE IF:   Your bleeding increases.  You feel lightheaded, weak, or you faint.  You have severe cramps in your back or abdomen.  You pass large blood clots in your stool.  Your problems are getting worse. MAKE SURE YOU:   Understand these instructions.  Will watch your condition.  Will get help right away if you are not doing well or get worse.   This information is not intended to replace advice given to you by your health care provider. Make sure you discuss any questions you have with your health care provider.   Document Released: 07/08/2000 Document Revised: 06/27/2012 Document Reviewed: 12/29/2014 Elsevier Interactive Patient Education [employer].

## 2016-04-15 NOTE — Telephone Encounter (Signed)
Seen today.   KP

## 2016-04-15 NOTE — Progress Notes (Signed)
Patient ID: [name], female    DOB: 10-19-1950  Age: 65 y.o. MRN: [id number]    Subjective:  Subjective  HPI JUSTEEN HEHR presents for hosp f/u .  Pt with no more abdominal pain but has no appetite and has lost about 40 lbs.  bp has been low-- bp meds held. Pt iis having severe back pain  She was in the hosp 8/30-9/1 and d/c to have GI w/u as out patient. Pt has had mult admissions and ER visits due to abd pain. This last time she had guiac + stool.  Pt had been going to pain management but because of all her hospital admissions and er visits she missed to OV and was charged $200 per pt-- they will not see her until it is paid.  She states she told them she was in the hosp but it did not change things.  She has been unable to go back and she is in terrible pain.  She has been without pain meds for a month.    Review of Systems  Constitutional: Positive for appetite change and fatigue. Negative for activity change and unexpected weight change.  Respiratory: Negative for cough and shortness of breath.   Cardiovascular: Negative for chest pain and palpitations.  Musculoskeletal: Positive for back pain.  Psychiatric/Behavioral: Negative for behavioral problems and dysphoric mood. The patient is not nervous/anxious.     History Past Medical History:  Diagnosis Date  . Asthma   . Degenerative joint disease   . Diabetes mellitus   . GERD (gastroesophageal reflux disease)   . Hyperlipidemia    no longer on medication for this  . Hypertension    actually taking for DM renal protection, not DM  . Neuropathy ([place])    bilateral hands and feet  . Sickle cell trait ([place])   . Sleep apnea   . stomach ca dx'd 2010   chemo/xrt comp 05/2009    She has a past surgical history that includes Total knee arthroplasty; Total hip arthroplasty; Shoulder surgery; Appendectomy; Cesarean section; Hernia repair; Colon surgery; Esophagogastroduodenoscopy (N/A, 10/15/2012); and Esophagogastroduodenoscopy  (N/A, 01/15/2016).   Her family history includes Alzheimer's disease (age of onset: 42) in her father; Cancer in her maternal grandfather; Cancer (age of onset: 56) in her mother; Diabetes in her brother and maternal grandmother.She reports that she quit smoking about 27 years ago. Her smoking use included Cigarettes. She started smoking about 42 years ago. She has a 15.00 pack-year smoking history. She has never used smokeless tobacco. She reports that she does not drink alcohol or use drugs.  Current Outpatient Prescriptions on File Prior to Visit  Medication Sig Dispense Refill  . acetic acid (VOSOL) 2 % otic solution Place 4 drops into both ears 3 (three) times daily. 15 mL 0  . aspirin 81 MG tablet Take 81 mg by mouth at bedtime.    . Blood Glucose Calibration (OT ULTRA/FASTTK CNTRL SOLN) SOLN     . Blood Glucose Monitoring Suppl (ONE TOUCH ULTRA 2) w/Device KIT     . Blood Pressure Monitoring (B-D ASSURE BPM/MANUAL ARM CUFF) MISC Check blood pressure daily. 1 each 0  . dicyclomine (BENTYL) 20 MG tablet Take 1 tablet (20 mg total) by mouth 4 (four) times daily -  before meals and at bedtime. 120 tablet 3  . EMBEDA 50-2 MG CPCR Take 1 capsule by mouth daily.    Marland Kitchen glimepiride (AMARYL) 4 MG tablet TAKE 1 TABLET(4 MG) BY MOUTH TWICE DAILY  60 tablet 0  . HYDROcodone-acetaminophen (NORCO/VICODIN) 5-325 MG tablet Take 1 tablet by mouth every 6 (six) hours as needed. (Patient taking differently: Take 1 tablet by mouth every 6 (six) hours as needed for moderate pain. ) 15 tablet 0  . metFORMIN (GLUCOPHAGE) 850 MG tablet Take 1 tablet (850 mg total) by mouth 2 (two) times daily. Repeat labs are due now 180 tablet 1  . Multiple Vitamin (MULTIVITAMIN WITH MINERALS) TABS tablet Take 1 tablet by mouth daily.    . NONFORMULARY OR COMPOUNDED ITEM IVF NSS 1 Liter over 1 hour 1 each 0  . ondansetron (ZOFRAN-ODT) 8 MG disintegrating tablet Take 1 tablet (8 mg total) by mouth every 8 (eight) hours as needed for  nausea or vomiting. 20 tablet 0  . ONE TOUCH ULTRA TEST test strip     . sucralfate (CARAFATE) 1 g tablet Take 1 tablet (1 g total) by mouth 4 (four) times daily -  with meals and at bedtime. 120 tablet 0  . traMADol (ULTRAM) 50 MG tablet Take 1 tablet (50 mg total) by mouth every 6 (six) hours as needed for severe pain. 60 tablet 0   No current facility-administered medications on file prior to visit.      Objective:  Objective  Physical Exam  Constitutional: She is oriented to person, place, and time. She appears well-developed and well-nourished.  HENT:  Head: Normocephalic and atraumatic.  Eyes: Conjunctivae and EOM are normal.  Neck: Normal range of motion. Neck supple. No JVD present. Carotid bruit is not present. No thyromegaly present.  Cardiovascular: Normal rate, regular rhythm and normal heart sounds.   No murmur heard. Pulmonary/Chest: Effort normal and breath sounds normal. No respiratory distress. She has no wheezes. She has no rales. She exhibits no tenderness.  Musculoskeletal: She exhibits no edema.  Neurological: She is alert and oriented to person, place, and time.  Psychiatric: She has a normal mood and affect. Her behavior is normal. Judgment and thought content normal.  Nursing note and vitals reviewed.  BP 90/64 (BP Location: Left Arm, Patient Position: Sitting, Cuff Size: Normal)   Temp 98.8 F (37.1 C) (Oral)   Resp 16   Ht 5' 7"  (1.702 m)   Wt 201 lb (91.2 kg)   BMI 31.48 kg/m  Wt Readings from Last 3 Encounters:  04/15/16 201 lb (91.2 kg)  04/01/16 204 lb (92.5 kg)  03/23/16 210 lb (95.3 kg)     Lab Results  Component Value Date   WBC 7.3 04/01/2016   HGB 13.4 04/01/2016   HCT 36.9 04/01/2016   PLT 275 04/01/2016   GLUCOSE 56 (L) 04/01/2016   CHOL 185 07/07/2015   TRIG 144 07/07/2015   HDL 58 07/07/2015   LDLCALC 98 07/07/2015   ALT 33 04/01/2016   AST 58 (H) 04/01/2016   NA 144 04/01/2016   K 3.9 04/01/2016   CL 115 (H) 03/24/2016    CREATININE 1.3 (H) 04/01/2016   BUN 7.5 04/01/2016   CO2 23 04/01/2016   TSH 1.241 04/01/2016   INR 0.98 11/05/2010   HGBA1C 11.2 (H) 12/30/2015   MICROALBUR 0.3 04/06/2015    No results found.   Assessment & Plan:  Plan  I have discontinued Ms. Vanriper's carvedilol. I have also changed her DULoxetine. Additionally, I am having her maintain her multivitamin with minerals, aspirin, metFORMIN, dicyclomine, EMBEDA, B-D ASSURE BPM/MANUAL ARM CUFF, ondansetron, HYDROcodone-acetaminophen, OT ULTRA/FASTTK CNTRL SOLN, ONE TOUCH ULTRA 2, ONE TOUCH ULTRA TEST, acetic acid, NONFORMULARY  OR COMPOUNDED ITEM, sucralfate, traMADol, and glimepiride.  Meds ordered this encounter  Medications  . DISCONTD: DULoxetine (CYMBALTA) 60 MG capsule    Sig: Take 60 mg by mouth 2 (two) times daily.    Refill:  1  . DULoxetine (CYMBALTA) 60 MG capsule    Sig: Take 1 capsule (60 mg total) by mouth 2 (two) times daily.    Dispense:  60 capsule    Refill:  1    Problem List Items Addressed This Visit      Unprioritized   Chronic pain syndrome (Chronic)    Per pain management  Will have RN call [facility] to explain pt situation       Essential hypertension    Now running low Stop coreg con't to follow with Home health      Generalized abdominal pain    Better but still with no appetite D/w pt getting protein powder or something that she can make herself since she can not have the boost et due to milk Upsetting her stomach F/u GI  con't bentyl       Major depressive disorder, single episode, moderate ([place])    cymbalta restarted-- pt does not know why it was stopped in hospital       Relevant Medications   DULoxetine (CYMBALTA) 60 MG capsule    Other Visit Diagnoses    Depression    -  Primary   Relevant Medications   DULoxetine (CYMBALTA) 60 MG capsule   Abdominal pain, unspecified abdominal location       Relevant Orders   CBC with Differential/Platelet   Comprehensive  metabolic panel   Gastrointestinal hemorrhage, unspecified gastritis, unspecified gastrointestinal hemorrhage type       Relevant Orders   CBC with Differential/Platelet   Comprehensive metabolic panel      Follow-up: Return in about 3 months (around 07/15/2016) for hypertension, hyperlipidemia.  Ann Held, DO

## 2016-04-15 NOTE — Assessment & Plan Note (Signed)
Per pain management  Will have RN call [facility] to explain pt situation

## 2016-04-18 ENCOUNTER — Telehealth: Payer: Self-pay | Admitting: Family Medicine

## 2016-04-18 NOTE — Telephone Encounter (Signed)
To MD, I have spoke with them and faxed her records previously.    KP

## 2016-04-18 NOTE — Telephone Encounter (Signed)
[name] sent all her records to pain management and spoke with the women in the office over there.  Pt was waiting to hear about the $200 fee--- she was in the hospital during both visits she missed and was in the ED several times and has been struggling with severe abd pain --- she is under the care of GI.   I'm happy to speak to the physician if he needs to --- pt desperately needs her pain meds and can't afford the $200 and does not want to break her contract by getting somewhere else.

## 2016-04-18 NOTE — Telephone Encounter (Signed)
Relation to MV:[facility]  Call back number:[phone] / [phone]  Reason for call:  Patient states PCP was going to call [facility] Dr. Legrand Como office on Friday concerning her $200 no show fees, patient states she was hospitalized and when she tried to inform the specialist they told her she would have to pay.  Patient inquiring about lab results. Please advise

## 2016-04-19 ENCOUNTER — Other Ambulatory Visit: Payer: Self-pay

## 2016-04-19 DIAGNOSIS — G894 Chronic pain syndrome: Secondary | ICD-10-CM | POA: Diagnosis not present

## 2016-04-19 DIAGNOSIS — K219 Gastro-esophageal reflux disease without esophagitis: Secondary | ICD-10-CM | POA: Diagnosis not present

## 2016-04-19 DIAGNOSIS — K922 Gastrointestinal hemorrhage, unspecified: Secondary | ICD-10-CM | POA: Diagnosis not present

## 2016-04-19 DIAGNOSIS — K760 Fatty (change of) liver, not elsewhere classified: Secondary | ICD-10-CM | POA: Diagnosis not present

## 2016-04-19 DIAGNOSIS — J45909 Unspecified asthma, uncomplicated: Secondary | ICD-10-CM | POA: Diagnosis not present

## 2016-04-19 DIAGNOSIS — I129 Hypertensive chronic kidney disease with stage 1 through stage 4 chronic kidney disease, or unspecified chronic kidney disease: Secondary | ICD-10-CM | POA: Diagnosis not present

## 2016-04-19 DIAGNOSIS — N183 Chronic kidney disease, stage 3 (moderate): Secondary | ICD-10-CM | POA: Diagnosis not present

## 2016-04-19 DIAGNOSIS — Z7984 Long term (current) use of oral hypoglycemic drugs: Secondary | ICD-10-CM | POA: Diagnosis not present

## 2016-04-19 DIAGNOSIS — E1122 Type 2 diabetes mellitus with diabetic chronic kidney disease: Secondary | ICD-10-CM | POA: Diagnosis not present

## 2016-04-19 DIAGNOSIS — G4730 Sleep apnea, unspecified: Secondary | ICD-10-CM | POA: Diagnosis not present

## 2016-04-19 DIAGNOSIS — E1142 Type 2 diabetes mellitus with diabetic polyneuropathy: Secondary | ICD-10-CM | POA: Diagnosis not present

## 2016-04-19 DIAGNOSIS — R7989 Other specified abnormal findings of blood chemistry: Secondary | ICD-10-CM

## 2016-04-19 NOTE — Telephone Encounter (Signed)
Patient called to inform provider that the dates [facility] is holding against her is August 9. Patient states she can not return to Pain Management until the fee is paid.

## 2016-04-19 NOTE — Telephone Encounter (Signed)
Called [facility] [phone]) and spoke with Inez Catalina, Supervisor who transferred me to [place], Insurance risk surveyor. I left a message for Theadora Rama to return my call, also left her Charlena Cross Martin's information incase she called while I was out of the office.   Copying Ebony on message so she is aware of the situation, we are trying to see if we can send the Hospital records for the 2 OV the patient missed with Pain management so they will waive the fee.

## 2016-04-19 NOTE — Telephone Encounter (Signed)
Patient has been made aware of the results and recommendations and voiced understanding, the ref has been placed, she said she has not been eating much due to her stomach and her sugars are low and she wants to stop her medication. I made her aware she had an elevated blood sugar at her visit and her A1c in June was 11, I advised she needs to continue the medication until Dr.Lowne says otherwise, but she really needs to make sure she is eating before her sugar bottoms out. She said she would but sometimes she just doesn't feel like eating, I advised she needs to make sure she eats a little something in those situations, she said she will eat some pudding.       KP

## 2016-04-19 NOTE — Telephone Encounter (Signed)
Thanks for helping

## 2016-04-19 NOTE — Telephone Encounter (Signed)
-----   Message from Ann Held, DO sent at 04/17/2016  2:55 PM EDT ----- Kidney function-- Cr is elevated---- refer to nephrology

## 2016-04-19 NOTE — Telephone Encounter (Signed)
Spoke with [employer], Counselling psychologist. Gave her information for June 23rd visit, once she receives the records they will review and look at adjusting the fee for the patient, once they make the decision on the fee they will reach out to the patient to inform her.   Informed patient of the above, she will wait to hear from their office.

## 2016-04-21 DIAGNOSIS — K219 Gastro-esophageal reflux disease without esophagitis: Secondary | ICD-10-CM | POA: Diagnosis not present

## 2016-04-21 DIAGNOSIS — N183 Chronic kidney disease, stage 3 (moderate): Secondary | ICD-10-CM | POA: Diagnosis not present

## 2016-04-21 DIAGNOSIS — K760 Fatty (change of) liver, not elsewhere classified: Secondary | ICD-10-CM | POA: Diagnosis not present

## 2016-04-21 DIAGNOSIS — I129 Hypertensive chronic kidney disease with stage 1 through stage 4 chronic kidney disease, or unspecified chronic kidney disease: Secondary | ICD-10-CM | POA: Diagnosis not present

## 2016-04-21 DIAGNOSIS — E1122 Type 2 diabetes mellitus with diabetic chronic kidney disease: Secondary | ICD-10-CM | POA: Diagnosis not present

## 2016-04-21 DIAGNOSIS — Z7984 Long term (current) use of oral hypoglycemic drugs: Secondary | ICD-10-CM | POA: Diagnosis not present

## 2016-04-21 DIAGNOSIS — G4730 Sleep apnea, unspecified: Secondary | ICD-10-CM | POA: Diagnosis not present

## 2016-04-21 DIAGNOSIS — K922 Gastrointestinal hemorrhage, unspecified: Secondary | ICD-10-CM | POA: Diagnosis not present

## 2016-04-21 DIAGNOSIS — E1142 Type 2 diabetes mellitus with diabetic polyneuropathy: Secondary | ICD-10-CM | POA: Diagnosis not present

## 2016-04-21 DIAGNOSIS — G894 Chronic pain syndrome: Secondary | ICD-10-CM | POA: Diagnosis not present

## 2016-04-21 DIAGNOSIS — J45909 Unspecified asthma, uncomplicated: Secondary | ICD-10-CM | POA: Diagnosis not present

## 2016-04-26 ENCOUNTER — Telehealth: Payer: Self-pay | Admitting: Family Medicine

## 2016-04-26 DIAGNOSIS — G894 Chronic pain syndrome: Secondary | ICD-10-CM | POA: Diagnosis not present

## 2016-04-26 DIAGNOSIS — R269 Unspecified abnormalities of gait and mobility: Secondary | ICD-10-CM | POA: Diagnosis not present

## 2016-04-26 DIAGNOSIS — I129 Hypertensive chronic kidney disease with stage 1 through stage 4 chronic kidney disease, or unspecified chronic kidney disease: Secondary | ICD-10-CM | POA: Diagnosis not present

## 2016-04-26 DIAGNOSIS — K760 Fatty (change of) liver, not elsewhere classified: Secondary | ICD-10-CM | POA: Diagnosis not present

## 2016-04-26 DIAGNOSIS — I10 Essential (primary) hypertension: Secondary | ICD-10-CM | POA: Diagnosis not present

## 2016-04-26 DIAGNOSIS — K219 Gastro-esophageal reflux disease without esophagitis: Secondary | ICD-10-CM | POA: Diagnosis not present

## 2016-04-26 DIAGNOSIS — Z7984 Long term (current) use of oral hypoglycemic drugs: Secondary | ICD-10-CM | POA: Diagnosis not present

## 2016-04-26 DIAGNOSIS — G4730 Sleep apnea, unspecified: Secondary | ICD-10-CM | POA: Diagnosis not present

## 2016-04-26 DIAGNOSIS — E1122 Type 2 diabetes mellitus with diabetic chronic kidney disease: Secondary | ICD-10-CM | POA: Diagnosis not present

## 2016-04-26 DIAGNOSIS — L89609 Pressure ulcer of unspecified heel, unspecified stage: Secondary | ICD-10-CM | POA: Diagnosis not present

## 2016-04-26 DIAGNOSIS — J45909 Unspecified asthma, uncomplicated: Secondary | ICD-10-CM | POA: Diagnosis not present

## 2016-04-26 DIAGNOSIS — K922 Gastrointestinal hemorrhage, unspecified: Secondary | ICD-10-CM | POA: Diagnosis not present

## 2016-04-26 DIAGNOSIS — N183 Chronic kidney disease, stage 3 (moderate): Secondary | ICD-10-CM | POA: Diagnosis not present

## 2016-04-26 DIAGNOSIS — E1142 Type 2 diabetes mellitus with diabetic polyneuropathy: Secondary | ICD-10-CM | POA: Diagnosis not present

## 2016-04-26 NOTE — Telephone Encounter (Signed)
Ok to wait until tomorrow--- if symptoms worsen over night --- go to ER

## 2016-04-26 NOTE — Telephone Encounter (Signed)
Make sure she is not taking her bp meds-- -she was still taking her coreg last time she was here Is she eating and drinking?  ----  She may need er for IV

## 2016-04-26 NOTE — Telephone Encounter (Signed)
Spoke with patient she said she is feeling ok, SOB with exertion only.She is still drinking and eating 3 meals per day and not taking any med's. She is schedule to see GI tomorrow.     KP

## 2016-04-26 NOTE — Telephone Encounter (Signed)
Caller name: Jinny Blossom at [facility] Can be reached: [phone]   Reason for call: Pts BP low 94/62 and sligh temp of 99.1. All else normal. Please call with recommendations.

## 2016-04-26 NOTE — Telephone Encounter (Signed)
Please advise      KP 

## 2016-04-27 ENCOUNTER — Telehealth: Payer: Self-pay | Admitting: Behavioral Health

## 2016-04-27 IMAGING — CR DG LUMBAR SPINE COMPLETE 4+V
5 series · 5 of 5 positions shown · non-contrast
Comparison: December 27, 2013

CLINICAL DATA: Lumbago following fall earlier today

EXAM:
LUMBAR SPINE - COMPLETE 4+ VIEW

[t l-spine a.p.]
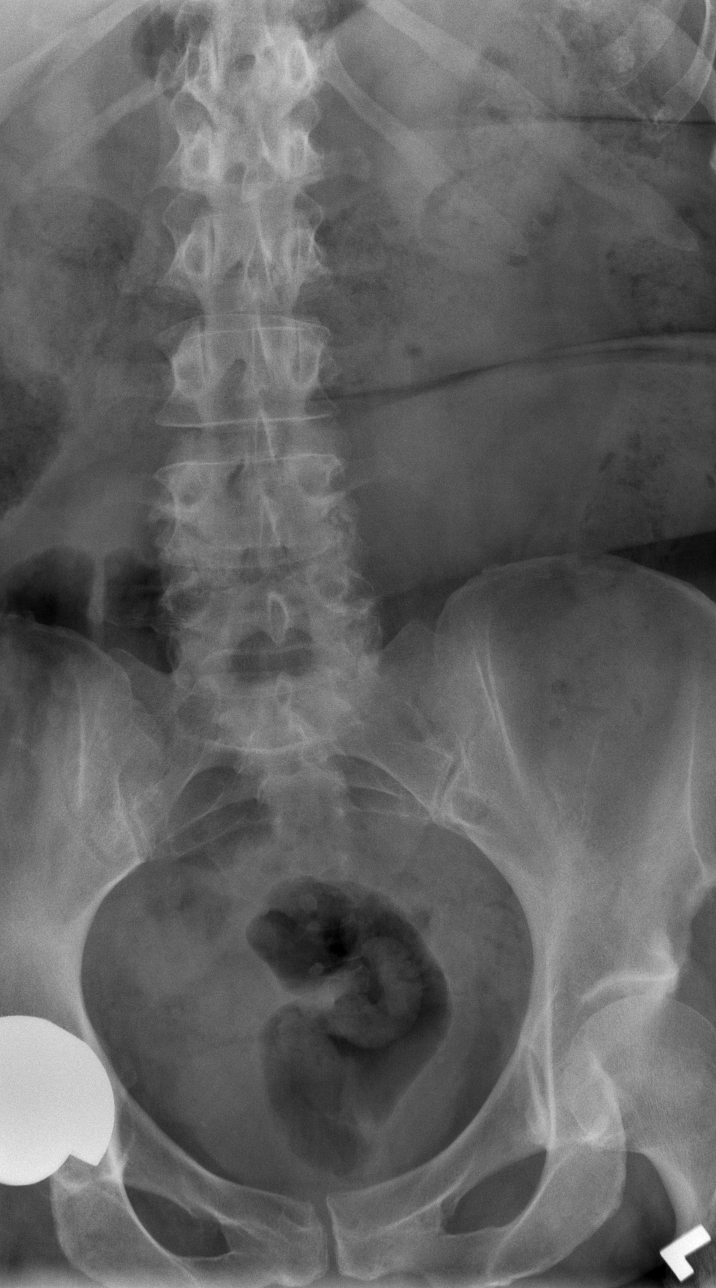

[t l-spine oblique exposure (1 of 2)]
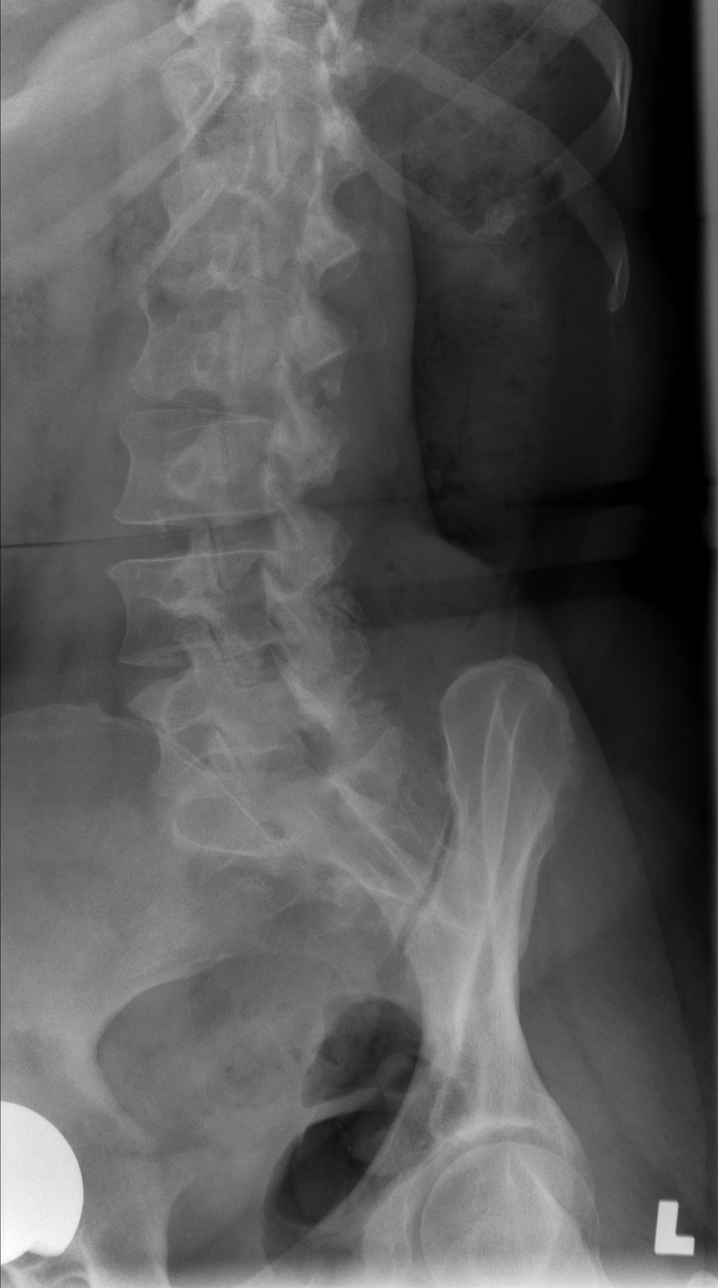

[t l-spine oblique exposure (2 of 2)]
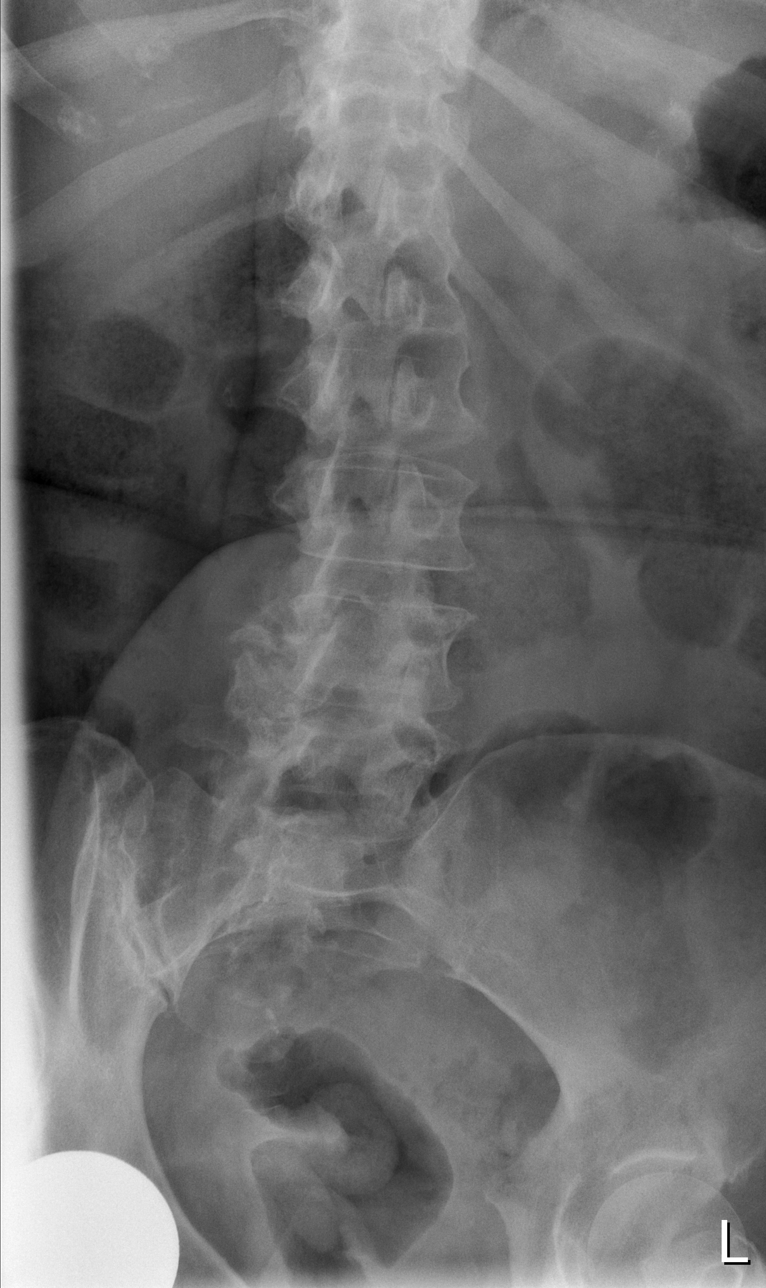

[t l-spine lat]
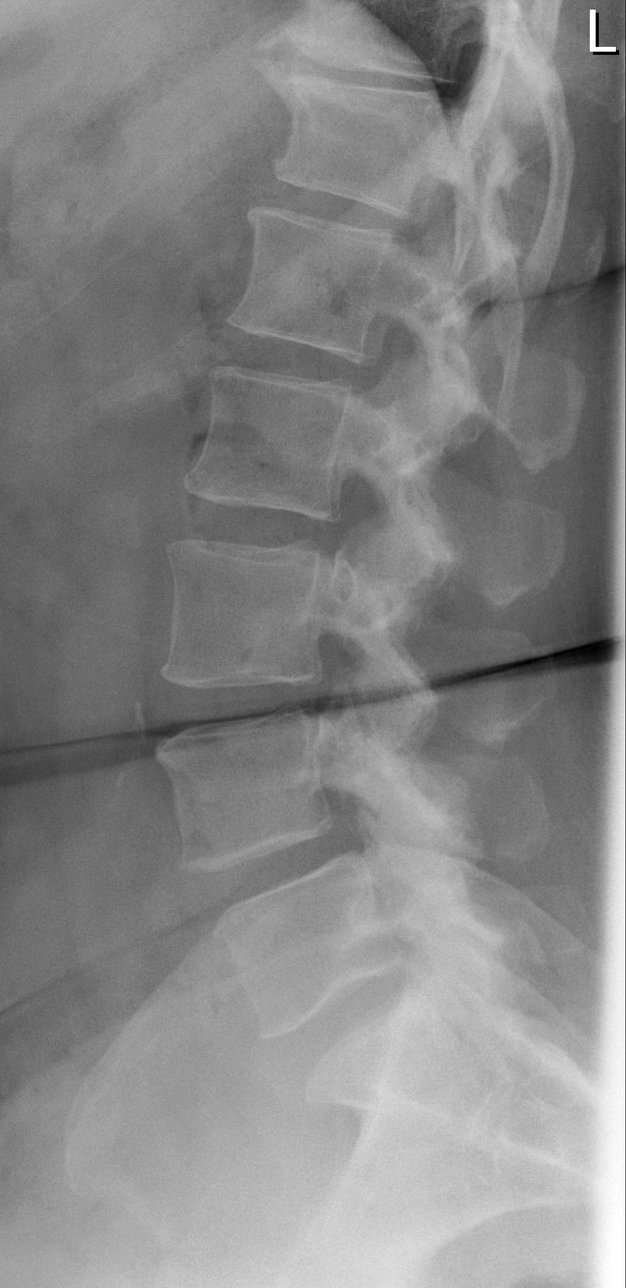

[t l-spine l5-s1 spot]
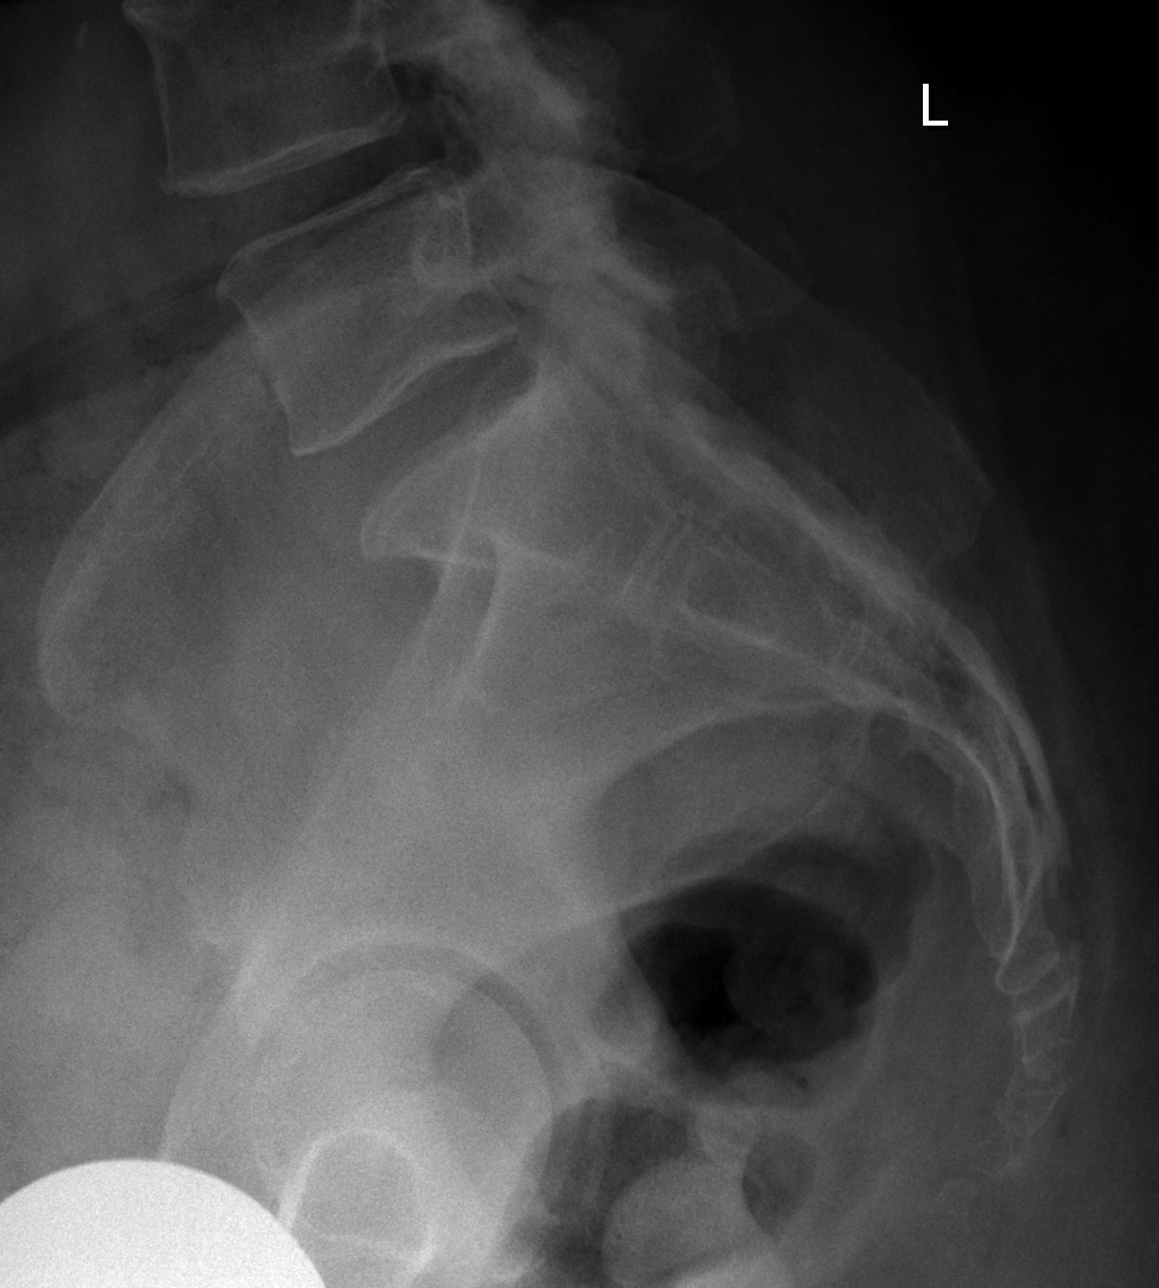

[5 of 5 positions shown; findings below may reference images not displayed]

FINDINGS: Frontal, lateral, spot lumbosacral lateral, and bilateral oblique
views were obtained. There are 5 non-rib-bearing lumbar type
vertebral bodies. There is no fracture or spondylolisthesis. There
is stable disc space narrowing at T11-12. There is no appreciable
lumbar disc space narrowing; lumbar discs appear stable. There is
facet osteoarthritic change at L3-4, L4-5, and L5-S1 on the left at
L4-5 and L5-S1 on the right, stable.
IMPRESSION: Areas of osteoarthritic change, essentially stable. No acute
fracture or spondylolisthesis.

## 2016-04-27 IMAGING — CR DG KNEE COMPLETE 4+V*L*
4 series · 4 of 4 positions shown · non-contrast
Comparison: 07/08/2013

CLINICAL DATA: Fall

EXAM:
LEFT KNEE - COMPLETE 4+ VIEW

[t knee lat left]
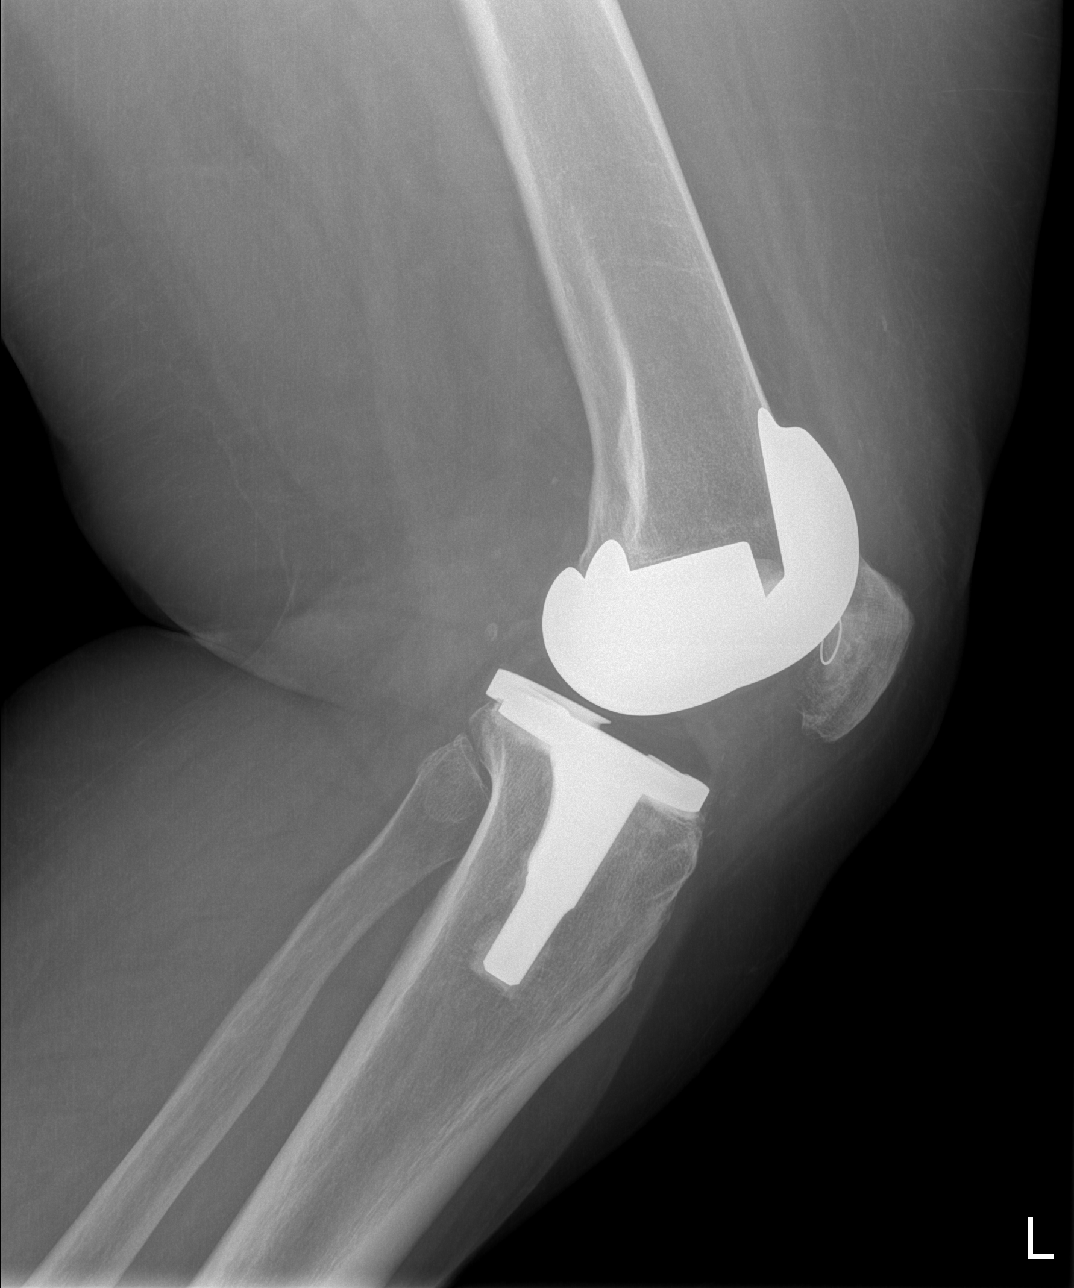

[t knee ap left]
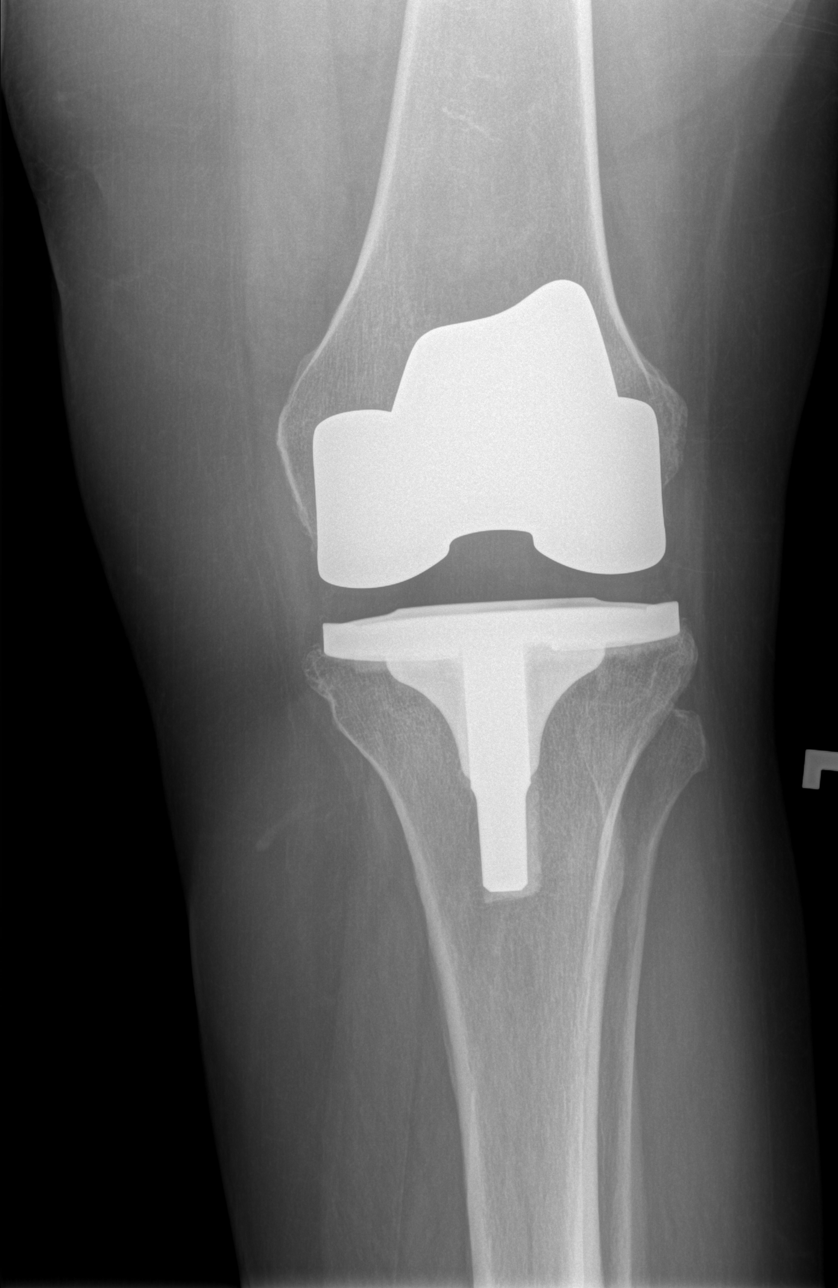

[t knee oblique left (1 of 2)]
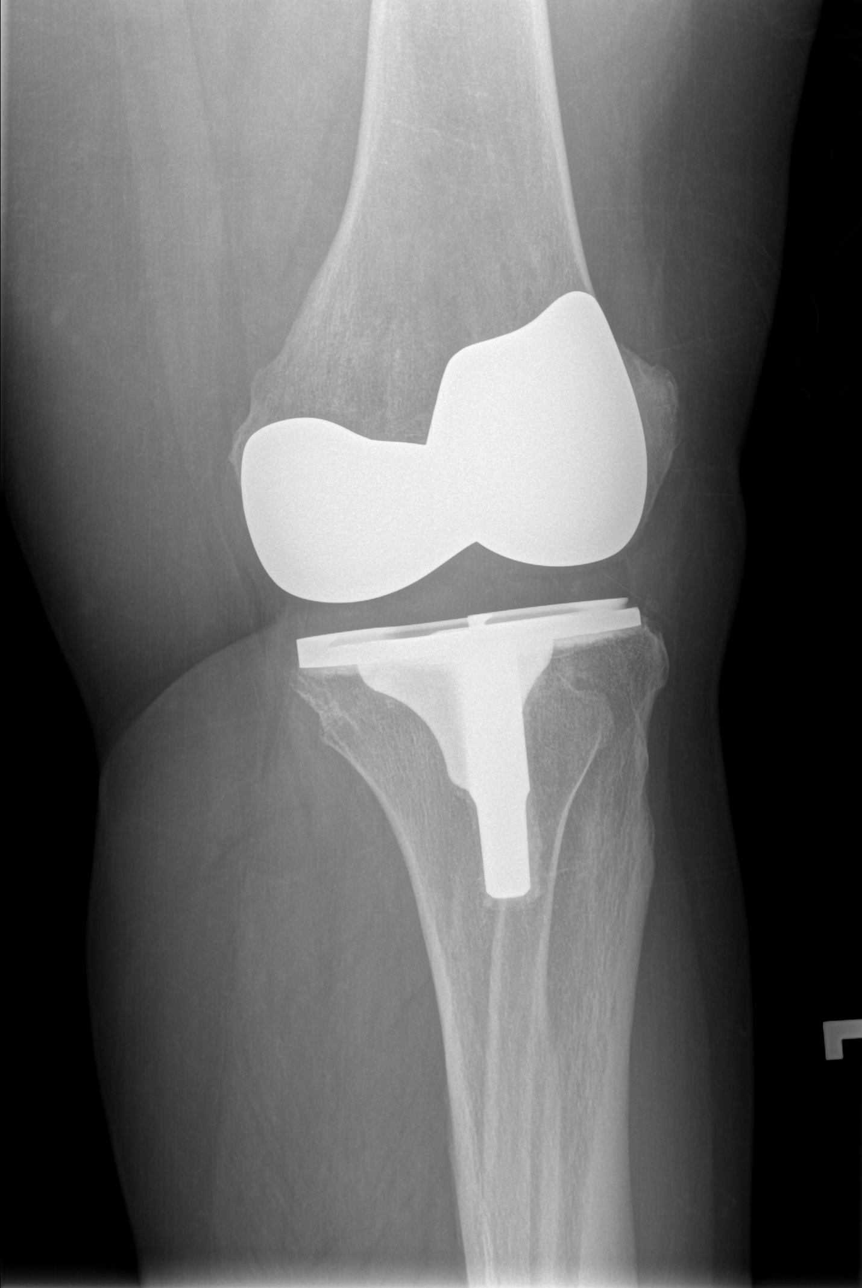

[t knee oblique left (2 of 2)]
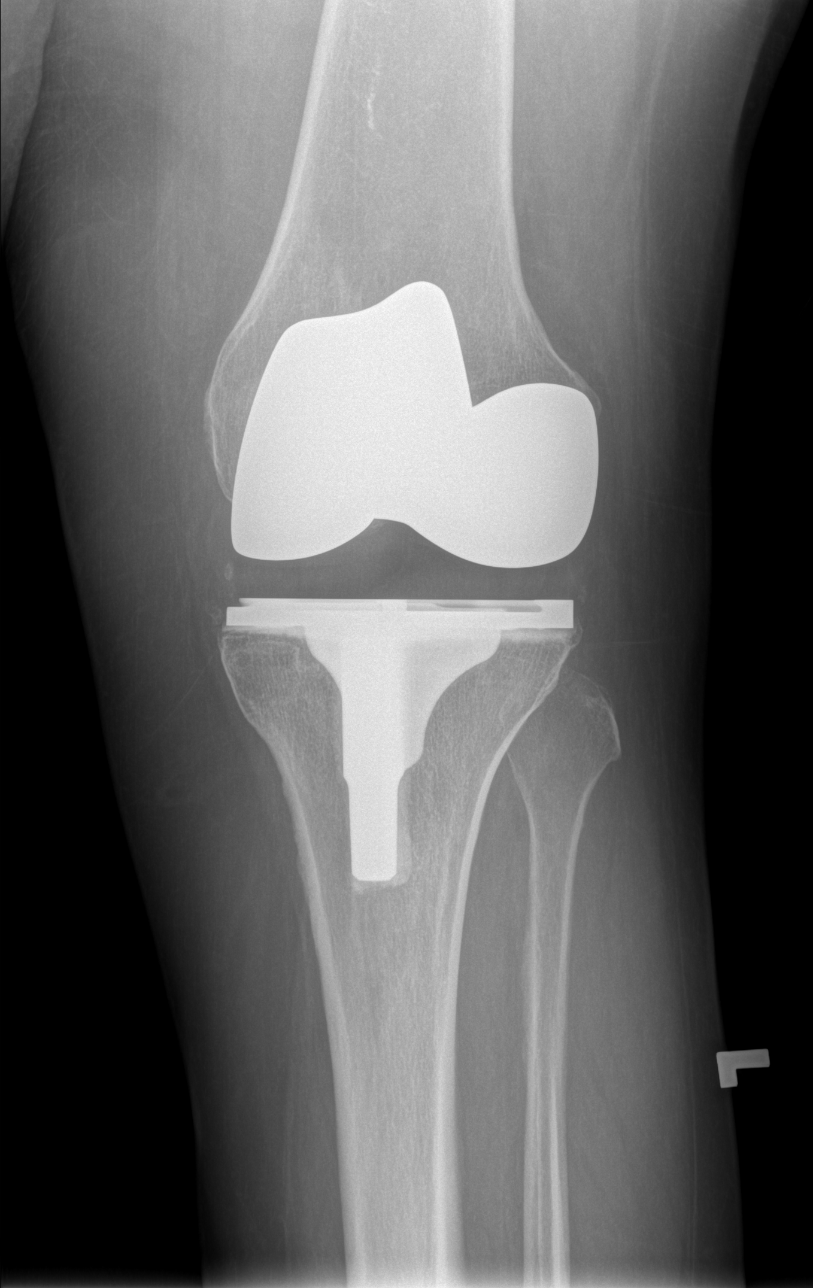

[4 of 4 positions shown; findings below may reference images not displayed]

FINDINGS: Total knee replacement in satisfactory position and alignment. No
loosening

Negative for fracture
IMPRESSION: Negative for fracture.

## 2016-04-27 NOTE — Telephone Encounter (Signed)
Patient voiced that she checked her blood pressure today and it's elevated, reading at 151/72. She denies headache, dizziness, lightheadedness, increased heart rate or any other symptoms. Patient stated, "that it's normally lower and currently does not take any medication". Offered to schedule the patient an appointment to follow-up with her PCP, but she opted to wait a few days and see if there's any change. Advised patient to call the office or seek the nearest ED if symptoms occur and blood pressure continues to increase. She verbalized understanding and did not have any further concerns prior to call ending.

## 2016-04-27 NOTE — Telephone Encounter (Signed)
I would tend to agree---  Her bp up until today has been running very low Maybe f/u sometime next week with ov

## 2016-04-27 NOTE — Telephone Encounter (Signed)
Patient has been made aware and she verbalized understanding, she mixed her days up and the apt is actually tomorrow, she said if she felt worst she would got to the ER. She also has an apt next week with the nephrologist.    KP

## 2016-04-28 DIAGNOSIS — R1084 Generalized abdominal pain: Secondary | ICD-10-CM | POA: Diagnosis not present

## 2016-04-28 DIAGNOSIS — D509 Iron deficiency anemia, unspecified: Secondary | ICD-10-CM | POA: Diagnosis not present

## 2016-04-28 NOTE — Telephone Encounter (Signed)
noted 

## 2016-04-28 NOTE — Telephone Encounter (Signed)
Informed patient of the provider's recommendation. She voiced understanding and agreed to follow-up with PCP for an office visit. Patient stated, that she "will call the office back later to make the appointment". Also, during the call the patient addressed that she has [name] that comes out to her house each week and checks her blood pressure, but if things get worse she will go to the ER. Message routed to PCP for review.

## 2016-04-28 NOTE — Telephone Encounter (Signed)
Message left to call the office.    KP 

## 2016-05-04 DIAGNOSIS — R55 Syncope and collapse: Secondary | ICD-10-CM | POA: Diagnosis not present

## 2016-05-04 DIAGNOSIS — N390 Urinary tract infection, site not specified: Secondary | ICD-10-CM | POA: Diagnosis not present

## 2016-05-04 DIAGNOSIS — N183 Chronic kidney disease, stage 3 (moderate): Secondary | ICD-10-CM | POA: Diagnosis not present

## 2016-05-04 DIAGNOSIS — I129 Hypertensive chronic kidney disease with stage 1 through stage 4 chronic kidney disease, or unspecified chronic kidney disease: Secondary | ICD-10-CM | POA: Diagnosis not present

## 2016-05-04 DIAGNOSIS — R7989 Other specified abnormal findings of blood chemistry: Secondary | ICD-10-CM | POA: Diagnosis not present

## 2016-05-05 ENCOUNTER — Telehealth: Payer: Self-pay | Admitting: Family Medicine

## 2016-05-05 DIAGNOSIS — K760 Fatty (change of) liver, not elsewhere classified: Secondary | ICD-10-CM | POA: Diagnosis not present

## 2016-05-05 DIAGNOSIS — J45909 Unspecified asthma, uncomplicated: Secondary | ICD-10-CM | POA: Diagnosis not present

## 2016-05-05 DIAGNOSIS — G894 Chronic pain syndrome: Secondary | ICD-10-CM | POA: Diagnosis not present

## 2016-05-05 DIAGNOSIS — I129 Hypertensive chronic kidney disease with stage 1 through stage 4 chronic kidney disease, or unspecified chronic kidney disease: Secondary | ICD-10-CM | POA: Diagnosis not present

## 2016-05-05 DIAGNOSIS — N390 Urinary tract infection, site not specified: Secondary | ICD-10-CM | POA: Diagnosis not present

## 2016-05-05 DIAGNOSIS — N183 Chronic kidney disease, stage 3 (moderate): Secondary | ICD-10-CM | POA: Diagnosis not present

## 2016-05-05 DIAGNOSIS — K219 Gastro-esophageal reflux disease without esophagitis: Secondary | ICD-10-CM | POA: Diagnosis not present

## 2016-05-05 DIAGNOSIS — Z7984 Long term (current) use of oral hypoglycemic drugs: Secondary | ICD-10-CM | POA: Diagnosis not present

## 2016-05-05 DIAGNOSIS — E1142 Type 2 diabetes mellitus with diabetic polyneuropathy: Secondary | ICD-10-CM | POA: Diagnosis not present

## 2016-05-05 DIAGNOSIS — G4730 Sleep apnea, unspecified: Secondary | ICD-10-CM | POA: Diagnosis not present

## 2016-05-05 DIAGNOSIS — E1122 Type 2 diabetes mellitus with diabetic chronic kidney disease: Secondary | ICD-10-CM | POA: Diagnosis not present

## 2016-05-05 DIAGNOSIS — K922 Gastrointestinal hemorrhage, unspecified: Secondary | ICD-10-CM | POA: Diagnosis not present

## 2016-05-05 NOTE — Telephone Encounter (Signed)
Caller name: Relationship to patient: Self Can be reached: [phone]  Pharmacy:  Reason for call: Request referral to [facility].

## 2016-05-06 ENCOUNTER — Telehealth: Payer: Self-pay | Admitting: Family Medicine

## 2016-05-06 NOTE — Telephone Encounter (Signed)
Please advise      KP 

## 2016-05-06 NOTE — Telephone Encounter (Signed)
ok 

## 2016-05-06 NOTE — Telephone Encounter (Signed)
Left message on voicemail to confirm that patient had received message on 9/21 that Dr. Carollee Herter will be out of the office 10/20 and her appointment had been cancelled. Asked patient to call the office to reschedule her Follow up.

## 2016-05-06 NOTE — Telephone Encounter (Signed)
Ref placed.      KP 

## 2016-05-11 ENCOUNTER — Telehealth: Payer: Self-pay | Admitting: Family Medicine

## 2016-05-11 DIAGNOSIS — K922 Gastrointestinal hemorrhage, unspecified: Secondary | ICD-10-CM | POA: Diagnosis not present

## 2016-05-11 DIAGNOSIS — K760 Fatty (change of) liver, not elsewhere classified: Secondary | ICD-10-CM | POA: Diagnosis not present

## 2016-05-11 DIAGNOSIS — J45909 Unspecified asthma, uncomplicated: Secondary | ICD-10-CM | POA: Diagnosis not present

## 2016-05-11 DIAGNOSIS — E1142 Type 2 diabetes mellitus with diabetic polyneuropathy: Secondary | ICD-10-CM | POA: Diagnosis not present

## 2016-05-11 DIAGNOSIS — G4730 Sleep apnea, unspecified: Secondary | ICD-10-CM | POA: Diagnosis not present

## 2016-05-11 DIAGNOSIS — Z7984 Long term (current) use of oral hypoglycemic drugs: Secondary | ICD-10-CM | POA: Diagnosis not present

## 2016-05-11 DIAGNOSIS — E1122 Type 2 diabetes mellitus with diabetic chronic kidney disease: Secondary | ICD-10-CM | POA: Diagnosis not present

## 2016-05-11 DIAGNOSIS — K219 Gastro-esophageal reflux disease without esophagitis: Secondary | ICD-10-CM | POA: Diagnosis not present

## 2016-05-11 DIAGNOSIS — I129 Hypertensive chronic kidney disease with stage 1 through stage 4 chronic kidney disease, or unspecified chronic kidney disease: Secondary | ICD-10-CM | POA: Diagnosis not present

## 2016-05-11 DIAGNOSIS — N183 Chronic kidney disease, stage 3 (moderate): Secondary | ICD-10-CM | POA: Diagnosis not present

## 2016-05-11 DIAGNOSIS — G894 Chronic pain syndrome: Secondary | ICD-10-CM | POA: Diagnosis not present

## 2016-05-11 NOTE — Telephone Encounter (Signed)
Janett Billow - Nurse - [phone]   She called in to make PCP aware that pt had a fall. She says that she feels okay and not hurting any where. She says that she is experiencing some nausea.

## 2016-05-12 NOTE — Telephone Encounter (Signed)
FYI-- Discussed with patient and she is doing ok after her fall. She is feeling better today. No problems or concerns at this time.    KP

## 2016-05-13 ENCOUNTER — Other Ambulatory Visit (HOSPITAL_BASED_OUTPATIENT_CLINIC_OR_DEPARTMENT_OTHER): Payer: Medicare Other

## 2016-05-13 ENCOUNTER — Other Ambulatory Visit: Payer: Self-pay | Admitting: Family

## 2016-05-13 ENCOUNTER — Other Ambulatory Visit: Payer: Self-pay

## 2016-05-13 ENCOUNTER — Encounter: Payer: Self-pay | Admitting: Family

## 2016-05-13 ENCOUNTER — Ambulatory Visit (HOSPITAL_BASED_OUTPATIENT_CLINIC_OR_DEPARTMENT_OTHER): Payer: Medicare Other | Admitting: Family

## 2016-05-13 ENCOUNTER — Ambulatory Visit: Payer: Medicare Other

## 2016-05-13 ENCOUNTER — Ambulatory Visit: Payer: Self-pay | Admitting: Family Medicine

## 2016-05-13 ENCOUNTER — Ambulatory Visit (HOSPITAL_BASED_OUTPATIENT_CLINIC_OR_DEPARTMENT_OTHER): Payer: Medicare Other

## 2016-05-13 VITALS — BP 103/83 | HR 108 | Temp 98.0°F | Resp 18 | Ht 67.0 in | Wt 195.0 lb

## 2016-05-13 DIAGNOSIS — I950 Idiopathic hypotension: Secondary | ICD-10-CM

## 2016-05-13 DIAGNOSIS — R634 Abnormal weight loss: Secondary | ICD-10-CM

## 2016-05-13 DIAGNOSIS — E860 Dehydration: Secondary | ICD-10-CM | POA: Diagnosis not present

## 2016-05-13 DIAGNOSIS — G894 Chronic pain syndrome: Secondary | ICD-10-CM

## 2016-05-13 DIAGNOSIS — Z85028 Personal history of other malignant neoplasm of stomach: Secondary | ICD-10-CM

## 2016-05-13 DIAGNOSIS — C162 Malignant neoplasm of body of stomach: Secondary | ICD-10-CM

## 2016-05-13 DIAGNOSIS — C169 Malignant neoplasm of stomach, unspecified: Secondary | ICD-10-CM

## 2016-05-13 DIAGNOSIS — E785 Hyperlipidemia, unspecified: Secondary | ICD-10-CM

## 2016-05-13 DIAGNOSIS — D509 Iron deficiency anemia, unspecified: Secondary | ICD-10-CM

## 2016-05-13 DIAGNOSIS — D508 Other iron deficiency anemias: Secondary | ICD-10-CM

## 2016-05-13 LAB — COMPREHENSIVE METABOLIC PANEL
ALT: 25 U/L (ref 0–55)
ANION GAP: 15 meq/L — AB (ref 3–11)
AST: 49 U/L — ABNORMAL HIGH (ref 5–34)
Albumin: 2.5 g/dL — ABNORMAL LOW (ref 3.5–5.0)
Alkaline Phosphatase: 86 U/L (ref 40–150)
BUN: 12.5 mg/dL (ref 7.0–26.0)
CHLORIDE: 110 meq/L — AB (ref 98–109)
CO2: 18 meq/L — AB (ref 22–29)
Calcium: 8.8 mg/dL (ref 8.4–10.4)
Creatinine: 1.6 mg/dL — ABNORMAL HIGH (ref 0.6–1.1)
EGFR: 40 mL/min/{1.73_m2} — AB (ref >90)
GLUCOSE: 197 mg/dL — AB (ref 70–140)
Potassium: 4.1 mEq/L (ref 3.5–5.1)
SODIUM: 143 meq/L (ref 136–145)
Total Bilirubin: 0.84 mg/dL (ref 0.20–1.20)
Total Protein: 6.3 g/dL — ABNORMAL LOW (ref 6.4–8.3)

## 2016-05-13 LAB — CBC WITH DIFFERENTIAL (CANCER CENTER ONLY)
BASO#: 0 10*3/uL (ref 0.0–0.2)
BASO%: 0.2 % (ref 0.0–2.0)
EOS ABS: 0.2 10*3/uL (ref 0.0–0.5)
EOS%: 1.7 % (ref 0.0–7.0)
HEMATOCRIT: 32.5 % — AB (ref 34.8–46.6)
HGB: 11.6 g/dL (ref 11.6–15.9)
LYMPH#: 2.6 10*3/uL (ref 0.9–3.3)
LYMPH%: 26.8 % (ref 14.0–48.0)
MCH: 31.2 pg (ref 26.0–34.0)
MCHC: 35.7 g/dL (ref 32.0–36.0)
MCV: 87 fL (ref 81–101)
MONO#: 0.9 10*3/uL (ref 0.1–0.9)
MONO%: 9.5 % (ref 0.0–13.0)
NEUT#: 5.9 10*3/uL (ref 1.5–6.5)
NEUT%: 61.8 % (ref 39.6–80.0)
PLATELETS: 237 10*3/uL (ref 145–400)
RBC: 3.72 10*6/uL (ref 3.70–5.32)
RDW: 15.3 % (ref 11.1–15.7)
WBC: 9.5 10*3/uL (ref 3.9–10.0)

## 2016-05-13 LAB — IRON AND TIBC
IRON: 98 ug/dL (ref 41–142)
TIBC: 90 ug/dL — ABNORMAL LOW (ref 236–444)

## 2016-05-13 LAB — LACTATE DEHYDROGENASE: LDH: 251 U/L — ABNORMAL HIGH (ref 125–245)

## 2016-05-13 LAB — FERRITIN: FERRITIN: 917 ng/mL — AB (ref 9–269)

## 2016-05-13 LAB — TSH: TSH: 3.061 m(IU)/L (ref 0.308–3.960)

## 2016-05-13 MED ORDER — SODIUM CHLORIDE 0.9 % IJ SOLN
10.0000 mL | INTRAMUSCULAR | Status: DC | PRN
  Administered 2016-05-13: 10 mL via INTRAVENOUS
  Filled 2016-05-13: qty 10

## 2016-05-13 MED ORDER — TRAMADOL HCL 50 MG PO TABS: 50.0000 mg | ORAL_TABLET | Freq: Four times a day (QID) | ORAL | 0 refills | Status: DC | PRN

## 2016-05-13 MED ORDER — HEPARIN SOD (PORK) LOCK FLUSH 100 UNIT/ML IV SOLN
500.0000 [IU] | Freq: Once | INTRAVENOUS | Status: AC | PRN
  Administered 2016-05-13: 500 [IU] via INTRAVENOUS
  Filled 2016-05-13: qty 5

## 2016-05-13 MED ORDER — SODIUM CHLORIDE 0.9 % IV SOLN
INTRAVENOUS | Status: DC
  Administered 2016-05-13: 10:00:00 via INTRAVENOUS

## 2016-05-13 NOTE — Patient Instructions (Signed)

## 2016-05-13 NOTE — Progress Notes (Signed)
Hematology and Oncology Follow Up Visit  [name] [id number] 04/04/1951 65 y.o. 05/13/2016   Principle Diagnosis:  Stage IB (T1, N1, M0) adenocarcinoma of the stomach Iron deficiency anemia  Current Therapy:   Observation IV iron as indicated    Interim History:  [name] is here today for a follow-up. She is feeling fatigued and having SOB with exertion. She states that she has had dark stools and is now set up to have a colonoscopy on November 8th. Hgb today is 11.6 with an MCV of 86. Iron studies are pending.  She has had a few episodes of palpitations. No chest pain.  No fever, chills, n/v, cough, rash, dizziness, headache, vision changes, chest pain, abdominal pain or changes in bowel or bladder habits. Her blood sugars are not well controlled. Her appetite comes and goes. She states that she feels dehydrated at this time. Her weight is down 9 lbs since her last visit.  Neuropathy in feet is unchanged. She is in the process of looking for a new pain clinic.  No swelling or tenderness in her extremities. No c/o joint or bone pain.   Medications:    Medication List       Accurate as of 05/13/16 10:08 AM. Always use your most recent med list.          acetic acid 2 % otic solution Commonly known as:  VOSOL Place 4 drops into both ears 3 (three) times daily.   aspirin 81 MG tablet Take 81 mg by mouth at bedtime.   B-D ASSURE BPM/MANUAL ARM CUFF Misc Check blood pressure daily.   dicyclomine 20 MG tablet Commonly known as:  BENTYL Take 1 tablet (20 mg total) by mouth 4 (four) times daily -  before meals and at bedtime.   DULoxetine 60 MG capsule Commonly known as:  CYMBALTA Take 1 capsule (60 mg total) by mouth 2 (two) times daily.   EMBEDA 50-2 MG Cpcr Generic drug:  Morphine-Naltrexone Take 1 capsule by mouth daily.   glimepiride 4 MG tablet Commonly known as:  AMARYL TAKE 1 TABLET(4 MG) BY MOUTH TWICE DAILY   HYDROcodone-acetaminophen 5-325 MG  tablet Commonly known as:  NORCO/VICODIN Take 1 tablet by mouth every 6 (six) hours as needed.   metFORMIN 850 MG tablet Commonly known as:  GLUCOPHAGE Take 1 tablet (850 mg total) by mouth 2 (two) times daily. Repeat labs are due now   multivitamin with minerals Tabs tablet Take 1 tablet by mouth daily.   NONFORMULARY OR COMPOUNDED ITEM IVF NSS 1 Liter over 1 hour   ondansetron 8 MG disintegrating tablet Commonly known as:  ZOFRAN-ODT Take 1 tablet (8 mg total) by mouth every 8 (eight) hours as needed for nausea or vomiting.   ONE TOUCH ULTRA 2 w/Device Kit   ONE TOUCH ULTRA TEST test strip Generic drug:  glucose blood   OT ULTRA/FASTTK CNTRL SOLN Soln   sucralfate 1 g tablet Commonly known as:  CARAFATE Take 1 tablet (1 g total) by mouth 4 (four) times daily -  with meals and at bedtime.   traMADol 50 MG tablet Commonly known as:  ULTRAM Take 1 tablet (50 mg total) by mouth every 6 (six) hours as needed for severe pain.       Allergies:  Allergies  Allergen Reactions  . Adhesive [Tape] Other (See Comments)    Burn Skin  . Codeine Other (See Comments)    paranoid  . Zocor [Simvastatin - High Dose] Other (  See Comments)    Muscle spasms  . Penicillins Rash    Has patient had a PCN reaction causing immediate rash, facial/tongue/throat swelling, SOB or lightheadedness with hypotension: Yes Has patient had a PCN reaction causing severe rash involving mucus membranes or skin necrosis: No Has patient had a PCN reaction that required hospitalization does not remember.  Has patient had a PCN reaction occurring within the last 10 years: No If all of the above answers are "NO", then may proceed with Cephalosporin use.     Past Medical History, Surgical history, Social history, and Family History were reviewed and updated.  Review of Systems: All other 10 point review of systems is negative.   Physical Exam:  height is 5' 7"  (1.702 m) and weight is 195 lb (88.5 kg).  Her oral temperature is 98 F (36.7 C). Her blood pressure is 103/83 and her pulse is 108 (abnormal). Her respiration is 18.   Wt Readings from Last 3 Encounters:  05/13/16 195 lb (88.5 kg)  04/15/16 201 lb (91.2 kg)  04/01/16 204 lb (92.5 kg)    Ocular: Sclerae unicteric, pupils equal, round and reactive to light Ear-nose-throat: Oropharynx clear, dentition fair Lymphatic: No cervical supraclavicular or axillary adenopathy Lungs no rales or rhonchi, good excursion bilaterally Heart regular rate and rhythm, no murmur appreciated Abd soft, nontender, positive bowel sounds, no liver or spleen tip palpated on exam, no fluid wave MSK no focal spinal tenderness, no joint edema Neuro: non-focal, well-oriented, appropriate affect Breasts: Deferred  Lab Results  Component Value Date   WBC 9.5 05/13/2016   HGB 11.6 05/13/2016   HCT 32.5 (L) 05/13/2016   MCV 87 05/13/2016   PLT 237 05/13/2016   Lab Results  Component Value Date   FERRITIN 873 (H) 04/01/2016   IRON 75 04/01/2016   TIBC 110 (L) 04/01/2016   UIBC 35 (L) 04/01/2016   IRONPCTSAT 68 (H) 04/01/2016   Lab Results  Component Value Date   RETICCTPCT 1.5 04/28/2014   RBC 3.72 05/13/2016   RETICCTABS 59.9 04/28/2014   No results found for: KPAFRELGTCHN, LAMBDASER, KAPLAMBRATIO No results found for: IGGSERUM, IGA, IGMSERUM No results found for: Odetta Pink, SPEI   Chemistry      Component Value Date/Time   NA 141 04/15/2016 1128   NA 144 04/01/2016 0839   K 4.1 04/15/2016 1128   K 3.9 04/01/2016 0839   CL 105 04/15/2016 1128   CL 102 09/25/2014 0933   CO2 25 04/15/2016 1128   CO2 23 04/01/2016 0839   BUN 11 04/15/2016 1128   BUN 7.5 04/01/2016 0839   CREATININE 1.55 (H) 04/15/2016 1128   CREATININE 1.3 (H) 04/01/2016 0839      Component Value Date/Time   CALCIUM 8.8 04/15/2016 1128   CALCIUM 9.1 04/01/2016 0839   ALKPHOS 74 04/15/2016 1128   ALKPHOS 87  04/01/2016 0839   AST 47 (H) 04/15/2016 1128   AST 58 (H) 04/01/2016 0839   ALT 29 04/15/2016 1128   ALT 33 04/01/2016 0839   BILITOT 0.8 04/15/2016 1128   BILITOT 0.67 04/01/2016 0839     Impression and Plan: [name] is a 65 yo African [place] female with multiple health issues including history of stage IB stomach cancer. She underwent resection and completed adjuvant radiation and chemotherapy 8 years ago. So far, she has done well and there has been no evidence of recurrence.  She is still feeling fatigued and having some mild  SOB with exertion. She has had dark tarry stools and is now scheduled for a colonoscopy on 11/8. Her CBC is stable. We will see what her iron studies show and bring her back in next week for an infusion if needed. We are giving her fluids today for dehydration.  We will plan to see her back in 6 weeks for labs and follow-up.  She will contact us with any questions or concerns. We can certainly see her sooner if need be.   Eliezer Bottom, NP 10/20/201710:08 AM

## 2016-05-13 NOTE — Patient Instructions (Signed)

## 2016-05-17 NOTE — Progress Notes (Deleted)
Electrophysiology Office Note   Date:  05/17/2016   ID:  Mattison, Golay September 19, 1950, MRN [id number]  PCP:  Ann Held, DO  Cardiologist:  *** Primary Electrophysiologist: *** Kyrese Gartman Meredith Leeds, MD    No chief complaint on file.    History of Present Illness: [name] is a 65 y.o. female who presents today for electrophysiology evaluation.   Hx DM, HLD, HTN, stage 1B stomach cancer.   Today, she denies*** symptoms of palpitations, chest pain, shortness of breath, orthopnea, PND, lower extremity edema, claudication, dizziness, presyncope, syncope, bleeding, or neurologic sequela. The patient is tolerating medications without difficulties and is otherwise without complaint today.    Past Medical History:  Diagnosis Date  . Asthma   . Degenerative joint disease   . Diabetes mellitus   . GERD (gastroesophageal reflux disease)   . Hyperlipidemia    no longer on medication for this  . Hypertension    actually taking for DM renal protection, not DM  . Neuropathy ([place])    bilateral hands and feet  . Sickle cell trait ([place])   . Sleep apnea   . stomach ca dx'd 2010   chemo/xrt comp 05/2009   Past Surgical History:  Procedure Laterality Date  . APPENDECTOMY    . CESAREAN SECTION    . COLON SURGERY    . ESOPHAGOGASTRODUODENOSCOPY N/A 10/15/2012   Procedure: ESOPHAGOGASTRODUODENOSCOPY (EGD);  Surgeon: Lear Ng, MD;  Location: [facility];  Service: Endoscopy;  Laterality: N/A;  . ESOPHAGOGASTRODUODENOSCOPY N/A 01/15/2016   Procedure: ESOPHAGOGASTRODUODENOSCOPY (EGD);  Surgeon: Ronald Lobo, MD;  Location: [facility];  Service: Endoscopy;  Laterality: N/A;  . HERNIA REPAIR    . SHOULDER SURGERY     Left  . TOTAL HIP ARTHROPLASTY     Right  . TOTAL KNEE ARTHROPLASTY     Left     Current Outpatient Prescriptions  Medication Sig Dispense Refill  . acetic acid (VOSOL) 2 % otic solution Place 4 drops into both ears 3 (three) times daily. 15 mL  0  . aspirin 81 MG tablet Take 81 mg by mouth at bedtime.    . Blood Glucose Calibration (OT ULTRA/FASTTK CNTRL SOLN) SOLN     . Blood Glucose Monitoring Suppl (ONE TOUCH ULTRA 2) w/Device KIT     . Blood Pressure Monitoring (B-D ASSURE BPM/MANUAL ARM CUFF) MISC Check blood pressure daily. 1 each 0  . dicyclomine (BENTYL) 20 MG tablet Take 1 tablet (20 mg total) by mouth 4 (four) times daily -  before meals and at bedtime. 120 tablet 3  . DULoxetine (CYMBALTA) 60 MG capsule Take 1 capsule (60 mg total) by mouth 2 (two) times daily. 60 capsule 1  . EMBEDA 50-2 MG CPCR Take 1 capsule by mouth daily.    Marland Kitchen glimepiride (AMARYL) 4 MG tablet TAKE 1 TABLET(4 MG) BY MOUTH TWICE DAILY 60 tablet 0  . HYDROcodone-acetaminophen (NORCO/VICODIN) 5-325 MG tablet Take 1 tablet by mouth every 6 (six) hours as needed. (Patient taking differently: Take 1 tablet by mouth every 6 (six) hours as needed for moderate pain. ) 15 tablet 0  . metFORMIN (GLUCOPHAGE) 850 MG tablet Take 1 tablet (850 mg total) by mouth 2 (two) times daily. Repeat labs are due now 180 tablet 1  . Multiple Vitamin (MULTIVITAMIN WITH MINERALS) TABS tablet Take 1 tablet by mouth daily.    . NONFORMULARY OR COMPOUNDED ITEM IVF NSS 1 Liter over 1 hour 1 each 0  .  ondansetron (ZOFRAN-ODT) 8 MG disintegrating tablet Take 1 tablet (8 mg total) by mouth every 8 (eight) hours as needed for nausea or vomiting. 20 tablet 0  . ONE TOUCH ULTRA TEST test strip     . sucralfate (CARAFATE) 1 g tablet Take 1 tablet (1 g total) by mouth 4 (four) times daily -  with meals and at bedtime. 120 tablet 0  . traMADol (ULTRAM) 50 MG tablet Take 1 tablet (50 mg total) by mouth every 6 (six) hours as needed for severe pain. 60 tablet 0   No current facility-administered medications for this visit.     Allergies:   Adhesive [tape]; Codeine; Zocor [simvastatin - high dose]; and Penicillins   Social History:  The patient  reports that she quit smoking about 27 years ago.  Her smoking use included Cigarettes. She started smoking about 42 years ago. She has a 15.00 pack-year smoking history. She has never used smokeless tobacco. She reports that she does not drink alcohol or use drugs.   Family History:  The patient's family history includes Alzheimer's disease (age of onset: 55) in her father; Cancer in her maternal grandfather; Cancer (age of onset: 64) in her mother; Diabetes in her brother and maternal grandmother.    ROS:  Please see the history of present illness.   Otherwise, review of systems is positive for ***.   All other systems are reviewed and negative.    PHYSICAL EXAM: VS:  There were no vitals taken for this visit. , BMI There is no height or weight on file to calculate BMI. GEN: Well nourished, well developed, in no acute distress  HEENT: normal  Neck: no JVD, carotid bruits, or masses Cardiac: ***RRR; no murmurs, rubs, or gallops,no edema  Respiratory:  clear to auscultation bilaterally, normal work of breathing GI: soft, nontender, nondistended, + BS MS: no deformity or atrophy  Skin: warm and dry Neuro:  Strength and sensation are intact Psych: euthymic mood, full affect  EKG:  EKG {ACTION; IS/IS [id number]} ordered today. Personal review of the ekg ordered shows ***  Recent Labs: 02/03/2016: Magnesium 1.2 05/13/2016: ALT 25; BUN 12.5; Creatinine 1.6; HGB 11.6; Platelets 237; Potassium 4.1; Sodium 143; TSH 3.061    Lipid Panel     Component Value Date/Time   CHOL 185 07/07/2015 1447   TRIG 144 07/07/2015 1447   HDL 58 07/07/2015 1447   CHOLHDL 3.2 07/07/2015 1447   VLDL 29 07/07/2015 1447   LDLCALC 98 07/07/2015 1447     Wt Readings from Last 3 Encounters:  05/13/16 195 lb (88.5 kg)  04/15/16 201 lb (91.2 kg)  04/01/16 204 lb (92.5 kg)      Other studies Reviewed: Additional studies/ records that were reviewed today include: TTE 02/04/16  Review of the above records today demonstrates:  - Left ventricle: The cavity  size was normal. Wall thickness was   increased in a pattern of moderate LVH. Systolic function was   normal. The estimated ejection fraction was in the range of 50%   to 55%. Wall motion was normal; there were no regional wall   motion abnormalities. Doppler parameters are consistent with   abnormal left ventricular relaxation (grade 1 diastolic   dysfunction).   ASSESSMENT AND PLAN:  1.  ***    Current medicines are reviewed at length with the patient today.   The patient {ACTIONS; HAS/DOES NOT HAVE:19233} concerns regarding her medicines.  The following changes were made today:  {NONE DEFAULTED:18576::"none"}  Labs/ tests  ordered today include: *** No orders of the defined types were placed in this encounter.    Disposition:   FU with *** {gen number [id number]} {Days to years:10300}  Signed, Kanaan Kagawa Meredith Leeds, MD  05/17/2016 9:18 PM     [address] [place] [address] [phone] (office) [phone] (fax)

## 2016-05-18 ENCOUNTER — Institutional Professional Consult (permissible substitution): Payer: Self-pay | Admitting: Cardiology

## 2016-05-18 DIAGNOSIS — K760 Fatty (change of) liver, not elsewhere classified: Secondary | ICD-10-CM | POA: Diagnosis not present

## 2016-05-18 DIAGNOSIS — J45909 Unspecified asthma, uncomplicated: Secondary | ICD-10-CM | POA: Diagnosis not present

## 2016-05-18 DIAGNOSIS — N183 Chronic kidney disease, stage 3 (moderate): Secondary | ICD-10-CM | POA: Diagnosis not present

## 2016-05-18 DIAGNOSIS — E1122 Type 2 diabetes mellitus with diabetic chronic kidney disease: Secondary | ICD-10-CM | POA: Diagnosis not present

## 2016-05-18 DIAGNOSIS — K219 Gastro-esophageal reflux disease without esophagitis: Secondary | ICD-10-CM | POA: Diagnosis not present

## 2016-05-18 DIAGNOSIS — G4730 Sleep apnea, unspecified: Secondary | ICD-10-CM | POA: Diagnosis not present

## 2016-05-18 DIAGNOSIS — K922 Gastrointestinal hemorrhage, unspecified: Secondary | ICD-10-CM | POA: Diagnosis not present

## 2016-05-18 DIAGNOSIS — Z7984 Long term (current) use of oral hypoglycemic drugs: Secondary | ICD-10-CM | POA: Diagnosis not present

## 2016-05-18 DIAGNOSIS — G894 Chronic pain syndrome: Secondary | ICD-10-CM | POA: Diagnosis not present

## 2016-05-18 DIAGNOSIS — E1142 Type 2 diabetes mellitus with diabetic polyneuropathy: Secondary | ICD-10-CM | POA: Diagnosis not present

## 2016-05-18 DIAGNOSIS — I129 Hypertensive chronic kidney disease with stage 1 through stage 4 chronic kidney disease, or unspecified chronic kidney disease: Secondary | ICD-10-CM | POA: Diagnosis not present

## 2016-05-20 ENCOUNTER — Telehealth: Payer: Self-pay | Admitting: Family Medicine

## 2016-05-20 NOTE — Telephone Encounter (Signed)
Patient wanted to wish me well.    KP

## 2016-05-20 NOTE — Telephone Encounter (Signed)
Patient requested that [name] call her back. She would not indicate the reason. Please advise.    Patient phone: [phone]

## 2016-05-23 ENCOUNTER — Telehealth: Payer: Self-pay | Admitting: Family Medicine

## 2016-05-23 ENCOUNTER — Ambulatory Visit: Payer: Self-pay | Admitting: Family Medicine

## 2016-05-23 NOTE — Telephone Encounter (Signed)
No charge. 

## 2016-05-23 NOTE — Telephone Encounter (Signed)
Pt called stating she does not have a ride and will not be here today for 11:00am appt. Charge or no charge?

## 2016-05-31 ENCOUNTER — Encounter (HOSPITAL_COMMUNITY): Payer: Self-pay | Admitting: *Deleted

## 2016-05-31 ENCOUNTER — Other Ambulatory Visit: Payer: Self-pay | Admitting: Gastroenterology

## 2016-06-01 ENCOUNTER — Ambulatory Visit (HOSPITAL_COMMUNITY): Payer: Medicare Other | Admitting: Certified Registered Nurse Anesthetist

## 2016-06-01 ENCOUNTER — Ambulatory Visit (HOSPITAL_COMMUNITY)
Admission: RE | Admit: 2016-06-01 | Discharge: 2016-06-01 | Disposition: A | Discharge status: Home / Self Care | Payer: Medicare Other | Source: Ambulatory Visit | Attending: Gastroenterology | Admitting: Gastroenterology

## 2016-06-01 ENCOUNTER — Encounter (HOSPITAL_COMMUNITY): Payer: Self-pay | Admitting: Certified Registered Nurse Anesthetist

## 2016-06-01 ENCOUNTER — Encounter (HOSPITAL_COMMUNITY)
Admission: RE | Disposition: A | Discharge status: Home / Self Care | Payer: Self-pay | Source: Ambulatory Visit | Attending: Gastroenterology

## 2016-06-01 DIAGNOSIS — F418 Other specified anxiety disorders: Secondary | ICD-10-CM | POA: Insufficient documentation

## 2016-06-01 DIAGNOSIS — Z8673 Personal history of transient ischemic attack (TIA), and cerebral infarction without residual deficits: Secondary | ICD-10-CM | POA: Diagnosis not present

## 2016-06-01 DIAGNOSIS — K640 First degree hemorrhoids: Secondary | ICD-10-CM | POA: Diagnosis not present

## 2016-06-01 DIAGNOSIS — Z820 Family history of epilepsy and other diseases of the nervous system: Secondary | ICD-10-CM | POA: Insufficient documentation

## 2016-06-01 DIAGNOSIS — Z9049 Acquired absence of other specified parts of digestive tract: Secondary | ICD-10-CM | POA: Insufficient documentation

## 2016-06-01 DIAGNOSIS — Z7984 Long term (current) use of oral hypoglycemic drugs: Secondary | ICD-10-CM | POA: Insufficient documentation

## 2016-06-01 DIAGNOSIS — E1122 Type 2 diabetes mellitus with diabetic chronic kidney disease: Secondary | ICD-10-CM | POA: Diagnosis not present

## 2016-06-01 DIAGNOSIS — K5730 Diverticulosis of large intestine without perforation or abscess without bleeding: Secondary | ICD-10-CM | POA: Insufficient documentation

## 2016-06-01 DIAGNOSIS — Z9889 Other specified postprocedural states: Secondary | ICD-10-CM | POA: Diagnosis not present

## 2016-06-01 DIAGNOSIS — K921 Melena: Secondary | ICD-10-CM | POA: Insufficient documentation

## 2016-06-01 DIAGNOSIS — Z9221 Personal history of antineoplastic chemotherapy: Secondary | ICD-10-CM | POA: Insufficient documentation

## 2016-06-01 DIAGNOSIS — D649 Anemia, unspecified: Secondary | ICD-10-CM | POA: Diagnosis not present

## 2016-06-01 DIAGNOSIS — Z7982 Long term (current) use of aspirin: Secondary | ICD-10-CM | POA: Insufficient documentation

## 2016-06-01 DIAGNOSIS — K760 Fatty (change of) liver, not elsewhere classified: Secondary | ICD-10-CM | POA: Insufficient documentation

## 2016-06-01 DIAGNOSIS — Z800 Family history of malignant neoplasm of digestive organs: Secondary | ICD-10-CM | POA: Insufficient documentation

## 2016-06-01 DIAGNOSIS — R1084 Generalized abdominal pain: Secondary | ICD-10-CM | POA: Diagnosis not present

## 2016-06-01 DIAGNOSIS — I129 Hypertensive chronic kidney disease with stage 1 through stage 4 chronic kidney disease, or unspecified chronic kidney disease: Secondary | ICD-10-CM | POA: Diagnosis not present

## 2016-06-01 DIAGNOSIS — Z85028 Personal history of other malignant neoplasm of stomach: Secondary | ICD-10-CM | POA: Diagnosis not present

## 2016-06-01 DIAGNOSIS — M1990 Unspecified osteoarthritis, unspecified site: Secondary | ICD-10-CM | POA: Insufficient documentation

## 2016-06-01 DIAGNOSIS — N183 Chronic kidney disease, stage 3 (moderate): Secondary | ICD-10-CM | POA: Diagnosis not present

## 2016-06-01 DIAGNOSIS — G894 Chronic pain syndrome: Secondary | ICD-10-CM | POA: Insufficient documentation

## 2016-06-01 DIAGNOSIS — D573 Sickle-cell trait: Secondary | ICD-10-CM | POA: Diagnosis not present

## 2016-06-01 DIAGNOSIS — G4733 Obstructive sleep apnea (adult) (pediatric): Secondary | ICD-10-CM | POA: Insufficient documentation

## 2016-06-01 DIAGNOSIS — Z79899 Other long term (current) drug therapy: Secondary | ICD-10-CM | POA: Insufficient documentation

## 2016-06-01 DIAGNOSIS — Z96641 Presence of right artificial hip joint: Secondary | ICD-10-CM | POA: Insufficient documentation

## 2016-06-01 DIAGNOSIS — E785 Hyperlipidemia, unspecified: Secondary | ICD-10-CM | POA: Insufficient documentation

## 2016-06-01 DIAGNOSIS — Z87891 Personal history of nicotine dependence: Secondary | ICD-10-CM | POA: Insufficient documentation

## 2016-06-01 DIAGNOSIS — Z801 Family history of malignant neoplasm of trachea, bronchus and lung: Secondary | ICD-10-CM | POA: Insufficient documentation

## 2016-06-01 DIAGNOSIS — J45909 Unspecified asthma, uncomplicated: Secondary | ICD-10-CM | POA: Insufficient documentation

## 2016-06-01 DIAGNOSIS — Z923 Personal history of irradiation: Secondary | ICD-10-CM | POA: Diagnosis not present

## 2016-06-01 DIAGNOSIS — K219 Gastro-esophageal reflux disease without esophagitis: Secondary | ICD-10-CM | POA: Diagnosis not present

## 2016-06-01 DIAGNOSIS — J9610 Chronic respiratory failure, unspecified whether with hypoxia or hypercapnia: Secondary | ICD-10-CM | POA: Insufficient documentation

## 2016-06-01 DIAGNOSIS — E1142 Type 2 diabetes mellitus with diabetic polyneuropathy: Secondary | ICD-10-CM | POA: Diagnosis not present

## 2016-06-01 DIAGNOSIS — Z833 Family history of diabetes mellitus: Secondary | ICD-10-CM | POA: Insufficient documentation

## 2016-06-01 DIAGNOSIS — Z96652 Presence of left artificial knee joint: Secondary | ICD-10-CM | POA: Insufficient documentation

## 2016-06-01 HISTORY — DX: Dyspnea, unspecified: R06.00

## 2016-06-01 HISTORY — DX: Transient cerebral ischemic attack, unspecified: G45.9

## 2016-06-01 HISTORY — PX: COLONOSCOPY WITH PROPOFOL: SHX5780

## 2016-06-01 LAB — GLUCOSE, CAPILLARY: Glucose-Capillary: 178 mg/dL — ABNORMAL HIGH (ref 65–99)

## 2016-06-01 SURGERY — COLONOSCOPY WITH PROPOFOL
Anesthesia: Monitor Anesthesia Care

## 2016-06-01 MED ORDER — PROPOFOL 500 MG/50ML IV EMUL
INTRAVENOUS | Status: DC | PRN
  Administered 2016-06-01: 75 ug/kg/min via INTRAVENOUS

## 2016-06-01 MED ORDER — SODIUM CHLORIDE 0.9 % IV SOLN: INTRAVENOUS | Status: DC

## 2016-06-01 MED ORDER — LACTATED RINGERS IV SOLN
INTRAVENOUS | Status: DC | PRN
  Administered 2016-06-01: 08:00:00 via INTRAVENOUS

## 2016-06-01 MED ORDER — PHENYLEPHRINE HCL 10 MG/ML IJ SOLN
INTRAMUSCULAR | Status: DC | PRN
  Administered 2016-06-01: 80 ug via INTRAVENOUS

## 2016-06-01 MED ORDER — LACTATED RINGERS IV SOLN
INTRAVENOUS | Status: DC
  Administered 2016-06-01: 08:00:00 via INTRAVENOUS

## 2016-06-01 NOTE — Anesthesia Procedure Notes (Addendum)
Procedure Name: MAC Date/Time: 06/01/2016 9:17 AM Performed by: Carney Living Pre-anesthesia Checklist: Patient identified, Emergency Drugs available, Suction available, Patient being monitored and Timeout performed Patient Re-evaluated:Patient Re-evaluated prior to inductionOxygen Delivery Method: Simple face mask

## 2016-06-01 NOTE — Op Note (Signed)
[facility] Patient Name: [name] Procedure Date : 06/01/2016 MRN: [id number] Attending MD: Lear Ng , MD Date of Birth: 1950/09/24 CSN: [id number] Age: 65 Admit Type: Outpatient Procedure:                Colonoscopy Indications:              Last colonoscopy: April 2010, Generalized abdominal                            pain, Family history of colon cancer in a                            first-degree relative Providers:                Lear Ng, MD, Cleda Daub, RN, Elspeth Cho Tech., Technician, Kerrie Pleasure, CRNA Referring MD:              Medicines:                Propofol per Anesthesia, Monitored Anesthesia Care Complications:            No immediate complications. Estimated Blood Loss:     Estimated blood loss: none. Procedure:                Pre-Anesthesia Assessment:                           - Prior to the procedure, a History and Physical                            was performed, and patient medications and                            allergies were reviewed. The patient's tolerance of                            previous anesthesia was also reviewed. The risks                            and benefits of the procedure and the sedation                            options and risks were discussed with the patient.                            All questions were answered, and informed consent                            was obtained. Prior Anticoagulants: The patient has                            taken no previous anticoagulant or antiplatelet  agents. ASA Grade Assessment: III - A patient with                            severe systemic disease. After reviewing the risks                            and benefits, the patient was deemed in                            satisfactory condition to undergo the procedure.                           After obtaining informed consent, the colonoscope                             was passed under direct vision. Throughout the                            procedure, the patient's blood pressure, pulse, and                            oxygen saturations were monitored continuously. The                            EC-[id number] ([id number]) scope was introduced through                            the anus and advanced to the the cecum, identified                            by appendiceal orifice and ileocecal valve. The                            colonoscopy was somewhat difficult due to a                            tortuous colon and fair prep. Successful completion                            of the procedure was aided by straightening and                            shortening the scope to obtain bowel loop reduction                            and lavage. The patient tolerated the procedure                            well. The quality of the bowel preparation was fair                            and fair but repeated irrigation led to a good and  adequate prep. The ileocecal valve, appendiceal                            orifice, and rectum were photographed. Scope In: 9:30:41 AM Scope Out: 9:41:56 AM Scope Withdrawal Time: 0 hours 5 minutes 6 seconds  Total Procedure Duration: 0 hours 11 minutes 15 seconds  Findings:      The perianal and digital rectal examinations were normal.      Internal hemorrhoids were found during retroflexion. The hemorrhoids       were small and Grade I (internal hemorrhoids that do not prolapse).      A few small-mouthed diverticula were found in the sigmoid colon.      The exam was otherwise normal throughout the examined colon. Impression:               - Preparation of the colon was fair.                           - Internal hemorrhoids.                           - Diverticulosis in the sigmoid colon.                           - No specimens collected. Moderate Sedation:      N/A - MAC  procedure Recommendation:           - Patient has a contact number available for                            emergencies. The signs and symptoms of potential                            delayed complications were discussed with the                            patient. Return to normal activities tomorrow.                            Written discharge instructions were provided to the                            patient.                           - High fiber diet.                           - Repeat colonoscopy in 5 years for screening                            purposes.                           - Continue present medications.                           - Post procedure medication orders were given.  Procedure Code(s):        --- Professional ---                           [phone], Colonoscopy, flexible; diagnostic, including                            collection of specimen(s) by brushing or washing,                            when performed (separate procedure) Diagnosis Code(s):        --- Professional ---                           R10.84, Generalized abdominal pain                           Z80.0, Family history of malignant neoplasm of                            digestive organs                           K64.0, First degree hemorrhoids                           K57.30, Diverticulosis of large intestine without                            perforation or abscess without bleeding CPT copyright 2016 [employer]. All rights reserved. The codes documented in this report are preliminary and upon coder review may  be revised to meet current compliance requirements. Lear Ng, MD 06/01/2016 9:53:36 AM This report has been signed electronically. Number of Addenda: 0

## 2016-06-01 NOTE — Anesthesia Preprocedure Evaluation (Addendum)
Anesthesia Evaluation  Patient identified by MRN, date of birth, ID band Patient awake    Reviewed: Allergy & Precautions, NPO status , Patient's Chart, lab work & pertinent test results  Airway Mallampati: II  TM Distance: >3 FB Neck ROM: Full    Dental  (+) Teeth Intact, Poor Dentition, Dental Advisory Given   Pulmonary shortness of breath and with exertion, asthma , sleep apnea , former smoker,    breath sounds clear to auscultation       Cardiovascular hypertension,  Rhythm:Regular Rate:Normal     Neuro/Psych Anxiety Depression TIA Neuromuscular disease    GI/Hepatic GERD  Medicated and Controlled,  Endo/Other  diabetes, Type 2, Oral Hypoglycemic Agents  Renal/GU Renal disease     Musculoskeletal  (+) Arthritis ,   Abdominal   Peds  Hematology  (+) anemia ,   Anesthesia Other Findings 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  No CPAP use  Reproductive/Obstetrics                            Anesthesia Physical Anesthesia Plan  ASA: III  Anesthesia Plan: MAC   Post-op Pain Management:    Induction: Intravenous  Airway Management Planned: Natural Airway and Simple Face Mask  Additional Equipment:   Intra-op Plan:   Post-operative Plan:   Informed Consent: I have reviewed the patients History and Physical, chart, labs and discussed the procedure including the risks, benefits and alternatives for the proposed anesthesia with the patient or authorized representative who has indicated his/her understanding and acceptance.   Dental advisory given  Plan Discussed with: CRNA, Anesthesiologist and Surgeon  Anesthesia Plan Comments:        Anesthesia Quick Evaluation

## 2016-06-01 NOTE — Transfer of Care (Signed)
Immediate Anesthesia Transfer of Care Note  Patient: [name]  Procedure(s) Performed: Procedure(s): COLONOSCOPY WITH PROPOFOL (N/A)  Patient Location: Endoscopy Unit  Anesthesia Type:MAC  Level of Consciousness: awake, alert , oriented and patient cooperative  Airway & Oxygen Therapy: Patient Spontanous Breathing and Patient connected to nasal cannula oxygen  Post-op Assessment: Report given to RN, Post -op Vital signs reviewed and stable and Patient moving all extremities X 4  Post vital signs: Reviewed and stable  Last Vitals:  Vitals:   06/01/16 0805 06/01/16 0952  BP: 106/71 93/64  Pulse: (!) 105 (!) 104  Resp: 15 15  Temp: 36.7 C     Last Pain:  Vitals:   06/01/16 0805  TempSrc: Oral         Complications: No apparent anesthesia complications

## 2016-06-01 NOTE — Discharge Instructions (Signed)

## 2016-06-01 NOTE — Anesthesia Postprocedure Evaluation (Signed)
Anesthesia Post Note  Patient: [name]  Procedure(s) Performed: Procedure(s) (LRB): COLONOSCOPY WITH PROPOFOL (N/A)  Patient location during evaluation: Endoscopy Anesthesia Type: MAC Level of consciousness: awake and alert Pain management: pain level controlled Vital Signs Assessment: post-procedure vital signs reviewed and stable Respiratory status: spontaneous breathing, nonlabored ventilation, respiratory function stable and patient connected to nasal cannula oxygen Cardiovascular status: stable and blood pressure returned to baseline Anesthetic complications: no    Last Vitals:  Vitals:   06/01/16 0952 06/01/16 1012  BP: 93/64 117/73  Pulse: (!) 104 82  Resp: 15 (!) 22  Temp:      Last Pain:  Vitals:   06/01/16 0805  TempSrc: Oral                 Earlene Bjelland,JAMES TERRILL

## 2016-06-01 NOTE — H&P (Signed)
Date of Initial H&P: 05/31/16  History reviewed, patient examined, no change in status, stable for surgery.

## 2016-06-01 NOTE — Anesthesia Postprocedure Evaluation (Deleted)
Anesthesia Post Note  Patient: [name]  Procedure(s) Performed: Procedure(s) (LRB): COLONOSCOPY WITH PROPOFOL (N/A)  Patient location during evaluation: PACU Anesthesia Type: General Level of consciousness: awake and alert Pain management: pain level controlled Vital Signs Assessment: post-procedure vital signs reviewed and stable Respiratory status: spontaneous breathing, nonlabored ventilation, respiratory function stable and patient connected to nasal cannula oxygen Cardiovascular status: blood pressure returned to baseline and stable Postop Assessment: no signs of nausea or vomiting Anesthetic complications: no    Last Vitals:  Vitals:   06/01/16 0952 06/01/16 1012  BP: 93/64 117/73  Pulse: (!) 104 82  Resp: 15 (!) 22  Temp:      Last Pain:  Vitals:   06/01/16 0805  TempSrc: Oral                 Ayven Pheasant,JAMES TERRILL

## 2016-06-01 NOTE — Interval H&P Note (Signed)
History and Physical Interval Note:  06/01/2016 9:05 AM  [name]  has presented today for surgery, with the diagnosis of anemia  The various methods of treatment have been discussed with the patient and family. After consideration of risks, benefits and other options for treatment, the patient has consented to  Procedure(s): COLONOSCOPY WITH PROPOFOL (N/A) as a surgical intervention .  The patient's history has been reviewed, patient examined, no change in status, stable for surgery.  I have reviewed the patient's chart and labs.  Questions were answered to the patient's satisfaction.     [place] C.

## 2016-06-02 ENCOUNTER — Encounter (HOSPITAL_COMMUNITY): Payer: Self-pay | Admitting: Gastroenterology

## 2016-06-20 ENCOUNTER — Other Ambulatory Visit: Payer: Self-pay | Admitting: Family Medicine

## 2016-06-20 DIAGNOSIS — F329 Major depressive disorder, single episode, unspecified: Secondary | ICD-10-CM

## 2016-06-20 DIAGNOSIS — F32A Depression, unspecified: Secondary | ICD-10-CM

## 2016-06-21 ENCOUNTER — Encounter: Payer: Self-pay | Admitting: Family Medicine

## 2016-06-21 ENCOUNTER — Ambulatory Visit (INDEPENDENT_AMBULATORY_CARE_PROVIDER_SITE_OTHER): Payer: Medicare Other | Admitting: Family Medicine

## 2016-06-21 VITALS — BP 102/80 | HR 64 | Temp 98.1°F | Resp 16 | Ht 67.0 in | Wt 200.0 lb

## 2016-06-21 DIAGNOSIS — I10 Essential (primary) hypertension: Secondary | ICD-10-CM

## 2016-06-21 DIAGNOSIS — Z0000 Encounter for general adult medical examination without abnormal findings: Secondary | ICD-10-CM

## 2016-06-21 DIAGNOSIS — Z23 Encounter for immunization: Secondary | ICD-10-CM

## 2016-06-21 DIAGNOSIS — G894 Chronic pain syndrome: Secondary | ICD-10-CM

## 2016-06-21 DIAGNOSIS — R82998 Other abnormal findings in urine: Secondary | ICD-10-CM

## 2016-06-21 DIAGNOSIS — N183 Chronic kidney disease, stage 3 (moderate): Secondary | ICD-10-CM | POA: Diagnosis not present

## 2016-06-21 DIAGNOSIS — R8299 Other abnormal findings in urine: Secondary | ICD-10-CM | POA: Diagnosis not present

## 2016-06-21 DIAGNOSIS — E785 Hyperlipidemia, unspecified: Secondary | ICD-10-CM | POA: Diagnosis not present

## 2016-06-21 DIAGNOSIS — E1122 Type 2 diabetes mellitus with diabetic chronic kidney disease: Secondary | ICD-10-CM | POA: Diagnosis not present

## 2016-06-21 DIAGNOSIS — R8290 Unspecified abnormal findings in urine: Secondary | ICD-10-CM | POA: Diagnosis not present

## 2016-06-21 LAB — POCT URINALYSIS DIPSTICK
BILIRUBIN UA: NEGATIVE
Blood, UA: NEGATIVE
Glucose, UA: NEGATIVE
Ketones, UA: NEGATIVE
NITRITE UA: NEGATIVE
PH UA: 6
Spec Grav, UA: 1.02
Urobilinogen, UA: 0.2

## 2016-06-21 MED ORDER — TRAMADOL HCL 50 MG PO TABS: 50.0000 mg | ORAL_TABLET | Freq: Two times a day (BID) | ORAL | 2 refills | Status: DC

## 2016-06-21 MED ORDER — NONFORMULARY OR COMPOUNDED ITEM
0 refills | Status: DC
Start: 2016-06-21 — End: 2016-06-27

## 2016-06-21 NOTE — Patient Instructions (Signed)

## 2016-06-21 NOTE — Progress Notes (Signed)
Subjective:   [name] is a 65 y.o. female who presents for Medicare Annual (Subsequent) preventive examination.  HPI HYPERTENSION   Blood pressure range-not checking  Chest pain- no      Dyspnea- no Lightheadedness- no   Edema- no  Other side effects - no   Medication compliance: good Low salt diet- yes    DIABETES    Blood Sugar ranges-95-125  Polyuria- bi New Visual problems- no  Hypoglycemic symptoms- no  Other side effects-no Medication compliance - good Last eye exam- due Foot exam- podiatry   HYPERLIPIDEMIA  Medication compliance- good RUQ pain- no  Muscle aches- no Other side effects-no  Review of Systems:  Review of Systems  Constitutional: Negative for activity change, appetite change and fatigue.  HENT: Negative for hearing loss, congestion, tinnitus and ear discharge.  dentist q49mEyes: Negative for visual disturbance (see optho q1y -- vision corrected to 20/20 with glasses).  Respiratory: Negative for cough, chest tightness and shortness of breath.   Cardiovascular: Negative for chest pain, palpitations and leg swelling.  Gastrointestinal: Negative for abdominal pain, diarrhea, constipation and abdominal distention.  Genitourinary: Negative for urgency, frequency, decreased urine volume and difficulty urinating.  Musculoskeletal: Negative for back pain, arthralgias and gait problem.  Skin: Negative for color change, pallor and rash.  Neurological: Negative for dizziness, light-headedness, numbness and headaches.  Hematological: Negative for adenopathy. Does not bruise/bleed easily.  Psychiatric/Behavioral: Negative for suicidal ideas, confusion, sleep disturbance, self-injury, dysphoric mood, decreased concentration and agitation.            Objective:     Vitals: BP 102/80 (BP Location: Right Arm, Patient Position: Sitting, Cuff Size: Normal)   Pulse 64   Temp 98.1 F (36.7 C) (Oral)   Resp 16   Ht _0  (1.702 m)   Wt 200 lb (90.7 kg)    BMI 31.32 kg/m   Body mass index is 31.32 kg/m. BP 102/80 (BP Location: Right Arm, Patient Position: Sitting, Cuff Size: Normal)   Pulse 64   Temp 98.1 F (36.7 C) (Oral)   Resp 16   Ht _1  (1.702 m)   Wt 200 lb (90.7 kg)   BMI 31.32 kg/m  General appearance: alert, cooperative, appears stated age and no distress Head: Normocephalic, without obvious abnormality, atraumatic Eyes: conjunctivae/corneas clear. PERRL, EOM's intact. Fundi benign. Ears: normal TM's and external ear canals both ears Nose: Nares normal. Septum midline. Mucosa normal. No drainage or sinus tenderness. Throat: lips, mucosa, and tongue normal; teeth and gums normal Neck: no adenopathy, no carotid bruit, no JVD, supple, symmetrical, trachea midline and thyroid not enlarged, symmetric, no tenderness/mass/nodules Back: symmetric, no curvature. ROM normal. No CVA tenderness. Lungs: clear to auscultation bilaterally Breasts: gyn Heart: regular rate and rhythm, S1, S2 normal, no murmur, click, rub or gallop Abdomen: soft, non-tender; bowel sounds normal; no masses,  no organomegaly Pelvic: deferred--gyn Extremities: extremities normal, atraumatic, no cyanosis or edema Pulses: 2+ and symmetric Skin: Skin color, texture, turgor normal. No rashes or lesions Lymph nodes: Cervical, supraclavicular, and axillary nodes normal. Neurologic: Alert and oriented X 3, normal strength and tone. Normal symmetric reflexes. Normal coordination and gait   Tobacco History  Smoking Status  . Former Smoker  . Packs/day: 1.00  . Years: 15.00  . Types: Cigarettes  . Start date: 09/27/1973  . Quit date: 10/15/1988  Smokeless Tobacco  . Never Used    Comment: quit smoking 14 years ago     Counseling given: Not  Answered   Past Medical History:  Diagnosis Date  . Asthma   . Degenerative joint disease   . Diabetes mellitus   . Dyspnea   . GERD (gastroesophageal reflux disease)   . Hyperlipidemia    no longer on medication  for this  . Hypertension    patient denies  . 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 Sickle cell trait ([place])   . Sleep apnea   . stomach ca dx'd 2010   chemo/xrt comp 05/2009  . TIA (transient ischemic attack) 07/2015   Past Surgical History:  Procedure Laterality Date  . APPENDECTOMY    . CESAREAN SECTION    . COLON SURGERY     colonscopy  . COLONOSCOPY WITH PROPOFOL N/A 06/01/2016   Procedure: COLONOSCOPY WITH PROPOFOL;  Surgeon: Wilford Corner, MD;  Location: [facility] ENDOSCOPY;  Service: Endoscopy;  Laterality: N/A;  . ESOPHAGOGASTRODUODENOSCOPY N/A 10/15/2012   Procedure: ESOPHAGOGASTRODUODENOSCOPY (EGD);  Surgeon: Lear Ng, MD;  Location: [facility] ENDOSCOPY;  Service: Endoscopy;  Laterality: N/A;  . ESOPHAGOGASTRODUODENOSCOPY N/A 01/15/2016   Procedure: ESOPHAGOGASTRODUODENOSCOPY (EGD);  Surgeon: Ronald Lobo, MD;  Location: [facility] ENDOSCOPY;  Service: Endoscopy;  Laterality: N/A;  . HERNIA REPAIR    . SHOULDER SURGERY     Left  . TOTAL HIP ARTHROPLASTY     Right  . TOTAL KNEE ARTHROPLASTY     Left   Family History  Problem Relation Age of Onset  . Diabetes Brother   . Alzheimer's disease Father 20  . Cancer Mother 52  . Diabetes Maternal Grandmother   . Cancer Maternal Grandfather     lung  . Suicidality Neg Hx   . Depression Neg Hx   . Dementia Neg Hx   . Anxiety disorder Neg Hx    History  Sexual Activity  . Sexual activity: No    Outpatient Encounter Prescriptions as of 06/21/2016  Medication Sig  . acetic acid (VOSOL) 2 % otic solution Place 4 drops into both ears 3 (three) times daily.  Marland Kitchen aspirin 81 MG tablet Take 81 mg by mouth at bedtime.  . Blood Glucose Calibration (OT ULTRA/FASTTK CNTRL SOLN) SOLN   . Blood Glucose Monitoring Suppl (ONE TOUCH ULTRA 2) w/Device KIT   . Blood Pressure Monitoring (B-D ASSURE BPM/MANUAL ARM CUFF) MISC Check blood pressure daily.  Marland Kitchen dicyclomine (BENTYL) 20 MG tablet Take 1 tablet (20 mg total) by mouth 4 (four)  times daily -  before meals and at bedtime. (Patient taking differently: Take 20 mg by mouth 4 (four) times daily -  before meals and at bedtime. )  . DULoxetine (CYMBALTA) 60 MG capsule TAKE 1 CAPSULE (60 MG TOTAL) BY MOUTH 2 (TWO) TIMES DAILY.  . EMBEDA 50-2 MG CPCR Take 1 capsule by mouth daily.  Marland Kitchen glimepiride (AMARYL) 4 MG tablet TAKE 1 TABLET(4 MG) BY MOUTH TWICE DAILY  . Multiple Vitamin (MULTIVITAMIN WITH MINERALS) TABS tablet Take 1 tablet by mouth daily.  . NONFORMULARY OR COMPOUNDED ITEM IVF NSS 1 Liter over 1 hour  . ONE TOUCH ULTRA TEST test strip   . traMADol (ULTRAM) 50 MG tablet Take 1 tablet (50 mg total) by mouth 2 (two) times daily.  . [DISCONTINUED] traMADol (ULTRAM) 50 MG tablet Take 50 mg by mouth 2 (two) times daily.  Marland Kitchen HYDROcodone-acetaminophen (NORCO/VICODIN) 5-325 MG tablet Take 1 tablet by mouth every 6 (six) hours as needed. (Patient not taking: Reported on 06/21/2016)  . metFORMIN (GLUCOPHAGE) 850 MG tablet Take 1  tablet (850 mg total) by mouth 2 (two) times daily. Repeat labs are due now (Patient not taking: Reported on 06/21/2016)  . NONFORMULARY OR COMPOUNDED ITEM Cmp, hgba1c, lipid,--dx DM II, hyperlipidemia, htn  . ondansetron (ZOFRAN-ODT) 8 MG disintegrating tablet Take 1 tablet (8 mg total) by mouth every 8 (eight) hours as needed for nausea or vomiting. (Patient not taking: Reported on 06/21/2016)  . sucralfate (CARAFATE) 1 g tablet Take 1 tablet (1 g total) by mouth 4 (four) times daily -  with meals and at bedtime. (Patient not taking: Reported on 06/21/2016)   No facility-administered encounter medications on file as of 06/21/2016.     Activities of Daily Living In your present state of health, do you have any difficulty performing the following activities: 06/21/2016 03/24/2016  Hearing? N -  Vision? N -  Difficulty concentrating or making decisions? N -  Walking or climbing stairs? Y -  Dressing or bathing? N -  Doing errands, shopping? N N  Some  recent data might be hidden    Patient Care Team: Ann Held, DO as PCP - General (Family Medicine) Philemon Kingdom, MD as Consulting Physician (Internal Medicine) Wilford Corner, MD as Consulting Physician (Gastroenterology) Volanda Napoleon, MD as Consulting Physician (Oncology) Myeong Roxine Caddy, DPM as Consulting Physician (Podiatry) Servando Salina, MD as Consulting Physician (Obstetrics and Gynecology)    Assessment:    cpe Exercise Activities and Dietary recommendations Current Exercise Habits: Home exercise routine, Type of exercise: walking, Intensity: Mild, Exercise limited by: respiratory conditions(s)  Goals    . Increase physical activity    . Reduce fat intake       Fall Risk Fall Risk  06/21/2016 06/21/2016 05/13/2016 01/25/2016 10/16/2015  Falls in the past year? Yes Yes Yes Yes No  Number falls in past yr: 2 or more 2 or more 1 1 -  Injury with Fall? No No No No -  Risk Factor Category  High Fall Risk - - - -  Risk for fall due to : History of fall(s);Impaired balance/gait - - - -  Risk for fall due to (comments): - - - - -  Follow up - - - - -   Depression Screen PHQ 2/9 Scores 06/21/2016 06/21/2016 01/25/2016 03/17/2015  PHQ - 2 Score 0 0 0 0     Cognitive Function MMSE - Mini Mental State Exam 03/17/2015  Orientation to time 5  Orientation to Place 5  Registration 3  Attention/ Calculation 5  Recall 1  Language- name 2 objects 2  Language- repeat 1  Language- follow 3 step command 3  Language- read & follow direction 1  Write a sentence 1  Copy design 1  Total score 28        Immunization History  Administered Date(s) Administered  . Influenza,inj,Quad PF,36+ Mos 05/27/2014, 04/06/2015, 06/21/2016  . Pneumococcal Polysaccharide-23 07/07/2015  . Tdap 02/17/2015   Screening Tests Health Maintenance  Topic Date Due  . INFLUENZA VACCINE  02/23/2016  . URINE MICROALBUMIN  04/05/2016  . MAMMOGRAM  05/02/2016  . ZOSTAVAX   07/06/2016 (Originally 03/22/2012)  . HIV Screening  03/04/2017 (Originally 03/23/1966)  . OPHTHALMOLOGY EXAM  06/21/2017 (Originally 05/28/2015)  . FOOT EXAM  06/28/2016  . HEMOGLOBIN A1C  06/30/2016  . PAP SMEAR  05/27/2017  . TETANUS/TDAP  02/16/2025  . COLONOSCOPY  06/01/2026  . Hepatitis C Screening  Completed      Plan:    see AVS During the course of the  visit the patient was educated and counseled about the following appropriate screening and preventive services:   Vaccines to include Pneumoccal, Influenza, Hepatitis B, Td, Zostavax, HCV  Electrocardiogram  Cardiovascular Disease  Colorectal cancer screening  Bone density screening  Diabetes screening  Glaucoma screening  Mammography/PAP  Nutrition counseling   Patient Instructions (the written plan) was given to the patient.  1. Controlled type 2 diabetes mellitus with stage 3 chronic kidney disease, without long-term current use of insulin (HCC)  - NONFORMULARY OR COMPOUNDED ITEM; Cmp, hgba1c, lipid,--dx DM II, hyperlipidemia, htn  Dispense: 1 each; Refill: 0 - POCT urinalysis dipstick - Microalbumin / creatinine urine ratio  2. Hyperlipidemia, unspecified hyperlipidemia type  - NONFORMULARY OR COMPOUNDED ITEM; Cmp, hgba1c, lipid,--dx DM II, hyperlipidemia, htn  Dispense: 1 each; Refill: 0  3. Essential hypertension  - NONFORMULARY OR COMPOUNDED ITEM; Cmp, hgba1c, lipid,--dx DM II, hyperlipidemia, htn  Dispense: 1 each; Refill: 0 - POCT urinalysis dipstick - Microalbumin / creatinine urine ratio  4. Chronic pain syndrome  - traMADol (ULTRAM) 50 MG tablet; Take 1 tablet (50 mg total) by mouth 2 (two) times daily.  Dispense: 60 tablet; Refill: 2  5. Encounter for Medicare annual wellness exam   6. Abnormal urine  - Urine Culture  7. Urine leukocytes  - Urine Culture  8. Encounter for immunization  - Flu Vaccine QUAD 36+ mos IM  Ann Held, DO  06/21/2016

## 2016-06-21 NOTE — Progress Notes (Signed)
Pre visit review using our clinic review tool, if applicable. No additional management support is needed unless otherwise documented below in the visit note. 

## 2016-06-22 LAB — MICROALBUMIN / CREATININE URINE RATIO
Creatinine,U: 140 mg/dL
MICROALB UR: 0.7 mg/dL (ref 0.0–1.9)
MICROALB/CREAT RATIO: 0.5 mg/g (ref 0.0–30.0)

## 2016-06-23 LAB — URINE CULTURE: Organism ID, Bacteria: NO GROWTH

## 2016-06-27 ENCOUNTER — Ambulatory Visit: Payer: Medicare Other

## 2016-06-27 ENCOUNTER — Ambulatory Visit (HOSPITAL_BASED_OUTPATIENT_CLINIC_OR_DEPARTMENT_OTHER): Payer: Medicare Other | Admitting: Hematology & Oncology

## 2016-06-27 ENCOUNTER — Encounter: Payer: Self-pay | Admitting: Hematology & Oncology

## 2016-06-27 ENCOUNTER — Other Ambulatory Visit: Payer: Self-pay | Admitting: *Deleted

## 2016-06-27 ENCOUNTER — Other Ambulatory Visit: Payer: Self-pay | Admitting: Family Medicine

## 2016-06-27 ENCOUNTER — Other Ambulatory Visit (HOSPITAL_BASED_OUTPATIENT_CLINIC_OR_DEPARTMENT_OTHER): Payer: Medicare Other

## 2016-06-27 ENCOUNTER — Ambulatory Visit (HOSPITAL_BASED_OUTPATIENT_CLINIC_OR_DEPARTMENT_OTHER): Payer: Medicare Other

## 2016-06-27 VITALS — BP 137/77 | HR 72 | Temp 98.1°F | Wt 199.0 lb

## 2016-06-27 DIAGNOSIS — Z1231 Encounter for screening mammogram for malignant neoplasm of breast: Secondary | ICD-10-CM

## 2016-06-27 DIAGNOSIS — Z85028 Personal history of other malignant neoplasm of stomach: Secondary | ICD-10-CM

## 2016-06-27 DIAGNOSIS — E1142 Type 2 diabetes mellitus with diabetic polyneuropathy: Secondary | ICD-10-CM | POA: Diagnosis not present

## 2016-06-27 DIAGNOSIS — M79604 Pain in right leg: Secondary | ICD-10-CM

## 2016-06-27 DIAGNOSIS — C162 Malignant neoplasm of body of stomach: Secondary | ICD-10-CM

## 2016-06-27 DIAGNOSIS — D508 Other iron deficiency anemias: Secondary | ICD-10-CM

## 2016-06-27 DIAGNOSIS — E782 Mixed hyperlipidemia: Secondary | ICD-10-CM

## 2016-06-27 DIAGNOSIS — C169 Malignant neoplasm of stomach, unspecified: Secondary | ICD-10-CM

## 2016-06-27 LAB — CMP (CANCER CENTER ONLY)
ALT: 16 U/L (ref 10–47)
AST: 30 U/L (ref 11–38)
Albumin: 3 g/dL — ABNORMAL LOW (ref 3.3–5.5)
Alkaline Phosphatase: 77 U/L (ref 26–84)
BUN: 9 mg/dL (ref 7–22)
CHLORIDE: 107 meq/L (ref 98–108)
CO2: 28 mEq/L (ref 18–33)
Calcium: 9.3 mg/dL (ref 8.0–10.3)
Creat: 1 mg/dl (ref 0.6–1.2)
GLUCOSE: 156 mg/dL — AB (ref 73–118)
POTASSIUM: 3.2 meq/L — AB (ref 3.3–4.7)
SODIUM: 143 meq/L (ref 128–145)
Total Bilirubin: 0.6 mg/dl (ref 0.20–1.60)
Total Protein: 6.4 g/dL (ref 6.4–8.1)

## 2016-06-27 LAB — CBC WITH DIFFERENTIAL (CANCER CENTER ONLY)
BASO#: 0 10*3/uL (ref 0.0–0.2)
BASO%: 0.5 % (ref 0.0–2.0)
EOS%: 5 % (ref 0.0–7.0)
Eosinophils Absolute: 0.3 10*3/uL (ref 0.0–0.5)
HEMATOCRIT: 32.1 % — AB (ref 34.8–46.6)
HEMOGLOBIN: 11.1 g/dL — AB (ref 11.6–15.9)
LYMPH#: 2.5 10*3/uL (ref 0.9–3.3)
LYMPH%: 38 % (ref 14.0–48.0)
MCH: 31.9 pg (ref 26.0–34.0)
MCHC: 34.6 g/dL (ref 32.0–36.0)
MCV: 92 fL (ref 81–101)
MONO#: 0.6 10*3/uL (ref 0.1–0.9)
MONO%: 9 % (ref 0.0–13.0)
NEUT%: 47.5 % (ref 39.6–80.0)
NEUTROS ABS: 3.2 10*3/uL (ref 1.5–6.5)
Platelets: 387 10*3/uL (ref 145–400)
RBC: 3.48 10*6/uL — ABNORMAL LOW (ref 3.70–5.32)
RDW: 14.3 % (ref 11.1–15.7)
WBC: 6.6 10*3/uL (ref 3.9–10.0)

## 2016-06-27 LAB — IRON AND TIBC
%SAT: 37 % (ref 21–57)
IRON: 64 ug/dL (ref 41–142)
TIBC: 174 ug/dL — AB (ref 236–444)
UIBC: 110 ug/dL — ABNORMAL LOW (ref 120–384)

## 2016-06-27 LAB — HEMOGLOBIN A1C
Est. average glucose Bld gHb Est-mCnc: 157 mg/dL
Hemoglobin A1c: 7.1 % — ABNORMAL HIGH (ref 4.8–5.6)

## 2016-06-27 LAB — LACTATE DEHYDROGENASE: LDH: 241 U/L (ref 125–245)

## 2016-06-27 LAB — FERRITIN: Ferritin: 684 ng/ml — ABNORMAL HIGH (ref 9–269)

## 2016-06-27 MED ORDER — HEPARIN SOD (PORK) LOCK FLUSH 100 UNIT/ML IV SOLN
500.0000 [IU] | Freq: Once | INTRAVENOUS | Status: AC | PRN
  Administered 2016-06-27: 500 [IU] via INTRAVENOUS
  Filled 2016-06-27: qty 5

## 2016-06-27 MED ORDER — SODIUM CHLORIDE 0.9 % IJ SOLN
10.0000 mL | INTRAMUSCULAR | Status: DC | PRN
  Administered 2016-06-27: 10 mL via INTRAVENOUS
  Filled 2016-06-27: qty 10

## 2016-06-27 NOTE — Patient Instructions (Signed)
[employer], Adult Introduction A central line is a soft, flexible tube (catheter) that can be used to collect blood for testing or to give medicine or nutrition through a vein. The tip of the central line ends in a large vein just above the heart called the vena cava. A central line may be placed because:  You need to get medicines or fluids through an IV tube for a long period of time.  You need nutrition but cannot eat or absorb nutrients.  The veins in your hands or arms are hard to access.  You need to have blood taken often for blood tests.  You need a blood transfusion  You need chemotherapy or dialysis. There are many types of central lines:  Peripherally inserted central catheter (PICC) line. This type is used for intermediate access to long-term access of one week or more. It can be used to draw blood and give fluids or medicines. A PICC looks like an IV tube, but it goes up the arm to the heart. It is usually inserted in the upper arm and taped in place on the arm.  Tunneled central line. This type is used for long-term therapy and dialysis. It is placed in a large vein in the neck, chest, or groin. A tunneled central line is inserted through a small incision made over the vein and is advanced into the heart. It is tunneled beneath the skin and brought out through a second incision.  Non-tunneled central line. This type is used for short-term access, usually of a maximum of 7 days. It is often used in the emergency department. A non-tunneled central line is inserted in the neck, chest, or groin.  Implanted port. This type is used for long-term therapy. It can stay in place longer than other types of central lines. An implanted port is normally inserted in the upper chest but can also be placed in the upper arm or in the abdomen. It is inserted and removed with surgery, and it is accessed using a special needle. The type of central line that you receive depends on how long you  will need it, your medical condition, and the condition of your veins. What are the risks? Using any type of central line has risks that you should be aware of, including:  Infection.  A blood clot that blocks the central line or forms in the vein and travels to the heart.  Bleeding from the place where the central line was put in.  Developing a hole or crack within the central line. If this happens, the central line will need to be replaced.  Developing an abnormal heart rhythm (arrhythmia). This is rare.  Central line failure. Follow these instructions at home: Flushing and cleaning the central line  Follow instructions from the health care provider about flushing and cleaning the central line.  Wear a mask when flushing or cleaning the central line.  Before you flush or clean the central line:  Wash your hands with soap and water.  Clean the central line hub with rubbing alcohol. Insertion site care  Keep the insertion site of your central line clean and dry at all times.  Check your incision or central line site every day for signs of infection. Check for:  More redness, swelling, or pain.  More fluid or blood.  Warmth.  Pus or a bad smell. General instructions  Follow instructions from your health care provider for the type of device that you have.  If the central  line accidentally gets pulled on, make sure:  The bandage (dressing) is okay.  There is no bleeding.  The line has not been pulled out.  Return to your normal activities as told by your health care provider. Ask your health care provider what activities are safe for you. You may be restricted from lifting or making repetitive arm movements on the side with the catheter.  Do not swim or bathe unless your health care provider approves.  Keep your dressing dry. Your health care provider can instruct you about how to keep your specific type of dressing from getting wet.  Keep all follow-up visits  as told by your health care provider. This is important. Contact a health care provider if:  You have more redness, swelling, or pain around your incision.  You have more fluid or blood coming from your incision.  Your incision feels warm to the touch.  You have pus or a bad smell coming from your incision. Get help right away if:  You have:  Chills.  A fever.  Shortness of breath.  Trouble breathing.  Chest pain.  Swelling in your neck, face, chest, or arm on the side of your central line.  You are coughing.  You feel your heart beating rapidly or skipping beats.  You feel dizzy or you faint.  Your incision or central line site has red streaks spreading away from the area.  Your incision or central line site is bleeding and does not stop.  Your central line is difficult to flush or will not flush.  You do not get a blood return from the central line.  Your central line gets loose or comes out.  Your central line gets damaged.  Your catheter leaks when flushed or when fluids are infused into it. This information is not intended to replace advice given to you by your health care provider. Make sure you discuss any questions you have with your health care provider. Document Released: 09/01/2005 Document Revised: 03/09/2016 Document Reviewed: 02/17/2016  2017 [employer]

## 2016-06-27 NOTE — Progress Notes (Signed)
Hematology and Oncology Follow Up Visit  [name] [id number] 06/28/1951 65 y.o. 06/27/2016   Principle Diagnosis:   Stage IB (T1, N1, M0) adenocarcinoma of the stomach Non-insulin-dependent diabetes   Current Therapy:   Observation     Interim History:  Ms.  Jefferson is back for follow-up. She has gone off quite a few of her medicines. She is very happy about this.  She is not having any abdominal pain. I'm not sure exactly why she is having all this abdominal pain back during the summertime.  She is complaining of some pain in the right leg. This is behind the right knee. There's been no swelling. She also has some pain in the right thigh. I think it probably would be a good idea to get a Doppler.  Her last iron studies back in October showed a ferritin of 917 with iron saturation greater than 100% .  She has had no fever. She has had no change in bowel or bladder habits. She has had no cough.   Overall, performance status is ECOG 2   Medications:  Current Outpatient Prescriptions:  .  aspirin 81 MG tablet, Take 81 mg by mouth at bedtime., Disp: , Rfl:  .  Blood Glucose Calibration (OT ULTRA/FASTTK CNTRL SOLN) SOLN, , Disp: , Rfl:  .  Blood Glucose Monitoring Suppl (ONE TOUCH ULTRA 2) w/Device KIT, , Disp: , Rfl:  .  Blood Pressure Monitoring (B-D ASSURE BPM/MANUAL ARM CUFF) MISC, Check blood pressure daily., Disp: 1 each, Rfl: 0 .  dicyclomine (BENTYL) 20 MG tablet, Take 1 tablet (20 mg total) by mouth 4 (four) times daily -  before meals and at bedtime. (Patient taking differently: Take 20 mg by mouth 4 (four) times daily -  before meals and at bedtime. ), Disp: 120 tablet, Rfl: 3 .  DULoxetine (CYMBALTA) 60 MG capsule, TAKE 1 CAPSULE (60 MG TOTAL) BY MOUTH 2 (TWO) TIMES DAILY., Disp: 180 capsule, Rfl: 0 .  glimepiride (AMARYL) 4 MG tablet, TAKE 1 TABLET(4 MG) BY MOUTH TWICE DAILY, Disp: 60 tablet, Rfl: 0 .  Multiple Vitamin (MULTIVITAMIN WITH MINERALS) TABS tablet, Take 1 tablet  by mouth daily., Disp: , Rfl:  .  ONE TOUCH ULTRA TEST test strip, , Disp: , Rfl:  .  traMADol (ULTRAM) 50 MG tablet, Take 1 tablet (50 mg total) by mouth 2 (two) times daily., Disp: 60 tablet, Rfl: 2 .  acetic acid (VOSOL) 2 % otic solution, Place 4 drops into both ears 3 (three) times daily. (Patient not taking: Reported on 06/27/2016), Disp: 15 mL, Rfl: 0  Allergies:  Allergies  Allergen Reactions  . Adhesive [Tape] Other (See Comments)    Burn Skin  . Codeine Other (See Comments)    paranoid  . Zocor [Simvastatin - High Dose] Other (See Comments)    Muscle spasms  . Penicillins Rash    Has patient had a PCN reaction causing immediate rash, facial/tongue/throat swelling, SOB or lightheadedness with hypotension: Yes Has patient had a PCN reaction causing severe rash involving mucus membranes or skin necrosis: No Has patient had a PCN reaction that required hospitalization does not remember.  Has patient had a PCN reaction occurring within the last 10 years: No If all of the above answers are "NO", then may proceed with Cephalosporin use.     Past Medical History, Surgical history, Social history, and Family History were reviewed and updated.  Review of Systems: As above  Physical Exam:  weight is 199 lb (  90.3 kg). Her oral temperature is 98.1 F (36.7 C). Her blood pressure is 137/77 and her pulse is 72.   Obese African American female. Head and neck exam shows no ocular or oral lesions. She has no palpable cervical or supraclavicular lymph nodes. Lungs are clear. Cardiac exam regular rate and rhythm with no murmurs, rubs or bruits. Abdomen is soft. She is moderately obese. She has a well-healed laparotomy scar. There is no fluid wave. There is no palpable liver or spleen tip. Back exam shows no tenderness over the spine ribs or hips. Extremities shows no clubbing cyanosis or edema. Skin exam no rashes. Neurological exam is nonfocal.  Lab Results  Component Value Date   WBC 6.6  06/27/2016   HGB 11.1 (L) 06/27/2016   HCT 32.1 (L) 06/27/2016   MCV 92 06/27/2016   PLT 387 06/27/2016     Chemistry      Component Value Date/Time   NA 143 06/27/2016 0933   NA 143 05/13/2016 0851   K 3.2 (L) 06/27/2016 0933   K 4.1 05/13/2016 0851   CL 107 06/27/2016 0933   CO2 28 06/27/2016 0933   CO2 18 (L) 05/13/2016 0851   BUN 9 06/27/2016 0933   BUN 12.5 05/13/2016 0851   CREATININE 1.0 06/27/2016 0933   CREATININE 1.6 (H) 05/13/2016 0851      Component Value Date/Time   CALCIUM 9.3 06/27/2016 0933   CALCIUM 8.8 05/13/2016 0851   ALKPHOS 77 06/27/2016 0933   ALKPHOS 86 05/13/2016 0851   AST 30 06/27/2016 0933   AST 49 (H) 05/13/2016 0851   ALT 16 06/27/2016 0933   ALT 25 05/13/2016 0851   BILITOT 0.60 06/27/2016 0933   BILITOT 0.84 05/13/2016 0851     .   Impression and Plan: [name] is 65 year old African [place] female with stage IB stomach cancer. She underwent resection. She then had adjuvant radiation and chemotherapy. She completed this about 8 years ago.  I'm so glad that her abdominal issues are gone now.  I'm little worried about this pain in the right leg. I cannot palpate any obvious venous cord. She is a negative Homans sign. However, I really think that we have to do a Doppler of her right leg. She is at higher risk for thromboembolic disease.  Of note, she is on aspirin. We will continue her on the aspirin until we get the Doppler result. I'll try to get the Doppler tomorrow.   I will like to see her back in 4 weeks. I want to make sure that we follow up with her closely.   I did go ahead and order a hemoglobin A1c on her.   Volanda Napoleon, MD 12/4/201711:36 AM

## 2016-06-27 NOTE — Addendum Note (Signed)
Addended by: Rico Ala on: 06/27/2016 11:43 AM   Modules accepted: Orders

## 2016-06-28 ENCOUNTER — Encounter: Payer: Self-pay | Admitting: *Deleted

## 2016-06-28 ENCOUNTER — Encounter: Payer: Self-pay | Admitting: Family

## 2016-06-30 ENCOUNTER — Ambulatory Visit (HOSPITAL_BASED_OUTPATIENT_CLINIC_OR_DEPARTMENT_OTHER)
Admission: RE | Admit: 2016-06-30 | Discharge: 2016-06-30 | Disposition: A | Discharge status: Home / Self Care | Payer: Medicare Other | Source: Ambulatory Visit | Attending: Family Medicine | Admitting: Family Medicine

## 2016-06-30 ENCOUNTER — Telehealth: Payer: Self-pay | Admitting: Internal Medicine

## 2016-06-30 ENCOUNTER — Ambulatory Visit (HOSPITAL_BASED_OUTPATIENT_CLINIC_OR_DEPARTMENT_OTHER)
Admission: RE | Admit: 2016-06-30 | Discharge: 2016-06-30 | Disposition: A | Discharge status: Home / Self Care | Payer: Medicare Other | Source: Ambulatory Visit | Attending: Hematology & Oncology | Admitting: Hematology & Oncology

## 2016-06-30 ENCOUNTER — Inpatient Hospital Stay (HOSPITAL_BASED_OUTPATIENT_CLINIC_OR_DEPARTMENT_OTHER): Admission: RE | Admit: 2016-06-30 | Payer: Self-pay | Source: Ambulatory Visit

## 2016-06-30 DIAGNOSIS — Z1231 Encounter for screening mammogram for malignant neoplasm of breast: Secondary | ICD-10-CM | POA: Diagnosis not present

## 2016-06-30 DIAGNOSIS — M79604 Pain in right leg: Secondary | ICD-10-CM | POA: Diagnosis present

## 2016-06-30 DIAGNOSIS — M7121 Synovial cyst of popliteal space [Baker], right knee: Secondary | ICD-10-CM | POA: Diagnosis not present

## 2016-06-30 DIAGNOSIS — E782 Mixed hyperlipidemia: Secondary | ICD-10-CM

## 2016-06-30 DIAGNOSIS — C162 Malignant neoplasm of body of stomach: Secondary | ICD-10-CM

## 2016-06-30 NOTE — Telephone Encounter (Signed)
[facility] called and said that the paper work that was sent over was incomplete and that section four with the testing frequencies needs to be filled in and sent back to them.

## 2016-07-01 ENCOUNTER — Telehealth: Payer: Self-pay | Admitting: *Deleted

## 2016-07-01 LAB — HEMOCHROMATOSIS DNA-PCR(C282Y,H63D)

## 2016-07-01 NOTE — Telephone Encounter (Addendum)
Patient aware of results  ----- Message from Volanda Napoleon, MD sent at 06/30/2016  6:18 PM EST ----- Call - NO blood clot in the leg.  You do have a cyst behind the right knee.  This is NOT cancer.  If this causes a lot of pain. We can get orthopedic surgery to drain it!!  pete

## 2016-07-27 ENCOUNTER — Other Ambulatory Visit: Payer: Medicare Other

## 2016-07-27 ENCOUNTER — Ambulatory Visit: Payer: Medicare Other | Admitting: Hematology & Oncology

## 2016-08-02 ENCOUNTER — Ambulatory Visit: Payer: Medicare Other | Admitting: Family

## 2016-08-02 ENCOUNTER — Ambulatory Visit (HOSPITAL_BASED_OUTPATIENT_CLINIC_OR_DEPARTMENT_OTHER): Payer: Medicare HMO | Admitting: Family

## 2016-08-02 ENCOUNTER — Other Ambulatory Visit (HOSPITAL_BASED_OUTPATIENT_CLINIC_OR_DEPARTMENT_OTHER): Payer: Medicare HMO

## 2016-08-02 ENCOUNTER — Other Ambulatory Visit: Payer: Medicare Other

## 2016-08-02 ENCOUNTER — Ambulatory Visit: Payer: Medicare HMO

## 2016-08-02 DIAGNOSIS — E782 Mixed hyperlipidemia: Secondary | ICD-10-CM

## 2016-08-02 DIAGNOSIS — G629 Polyneuropathy, unspecified: Secondary | ICD-10-CM | POA: Diagnosis not present

## 2016-08-02 DIAGNOSIS — D508 Other iron deficiency anemias: Secondary | ICD-10-CM | POA: Diagnosis not present

## 2016-08-02 DIAGNOSIS — Z862 Personal history of diseases of the blood and blood-forming organs and certain disorders involving the immune mechanism: Secondary | ICD-10-CM

## 2016-08-02 DIAGNOSIS — C162 Malignant neoplasm of body of stomach: Secondary | ICD-10-CM | POA: Diagnosis not present

## 2016-08-02 DIAGNOSIS — M79604 Pain in right leg: Secondary | ICD-10-CM

## 2016-08-02 DIAGNOSIS — Z85028 Personal history of other malignant neoplasm of stomach: Secondary | ICD-10-CM

## 2016-08-02 LAB — CMP (CANCER CENTER ONLY)
ALBUMIN: 3.4 g/dL (ref 3.3–5.5)
ALT: 19 U/L (ref 10–47)
AST: 25 U/L (ref 11–38)
Alkaline Phosphatase: 71 U/L (ref 26–84)
BILIRUBIN TOTAL: 0.6 mg/dL (ref 0.20–1.60)
BUN, Bld: 11 mg/dL (ref 7–22)
CO2: 27 meq/L (ref 18–33)
Calcium: 9.5 mg/dL (ref 8.0–10.3)
Chloride: 101 mEq/L (ref 98–108)
Creat: 1.2 mg/dl (ref 0.6–1.2)
GLUCOSE: 157 mg/dL — AB (ref 73–118)
Potassium: 3.8 mEq/L (ref 3.3–4.7)
SODIUM: 140 meq/L (ref 128–145)
Total Protein: 7.4 g/dL (ref 6.4–8.1)

## 2016-08-02 LAB — CBC WITH DIFFERENTIAL (CANCER CENTER ONLY)
BASO#: 0 10*3/uL (ref 0.0–0.2)
BASO%: 0.4 % (ref 0.0–2.0)
EOS%: 5.1 % (ref 0.0–7.0)
Eosinophils Absolute: 0.4 10*3/uL (ref 0.0–0.5)
HCT: 35.9 % (ref 34.8–46.6)
HEMOGLOBIN: 12.2 g/dL (ref 11.6–15.9)
LYMPH#: 2.6 10*3/uL (ref 0.9–3.3)
LYMPH%: 36.3 % (ref 14.0–48.0)
MCH: 31.2 pg (ref 26.0–34.0)
MCHC: 34 g/dL (ref 32.0–36.0)
MCV: 92 fL (ref 81–101)
MONO#: 0.7 10*3/uL (ref 0.1–0.9)
MONO%: 9.9 % (ref 0.0–13.0)
NEUT%: 48.3 % (ref 39.6–80.0)
NEUTROS ABS: 3.4 10*3/uL (ref 1.5–6.5)
Platelets: 341 10*3/uL (ref 145–400)
RBC: 3.91 10*6/uL (ref 3.70–5.32)
RDW: 12.7 % (ref 11.1–15.7)
WBC: 7.1 10*3/uL (ref 3.9–10.0)

## 2016-08-02 NOTE — Progress Notes (Signed)
Hematology and Oncology Follow Up Visit  [name] [id number] [birth year] 66 y.o. 08/02/2016   Principle Diagnosis:  Stage IB (T1, N1, M0) adenocarcinoma of the stomach Iron deficiency anemia  Current Therapy:   Observation IV iron as indicated - last received in December 2016     Interim History:  [name] is here today for a follow-up. She is doing quite well and in good spirits. Her only complaint at this time is the same neuropathy pain in both lower extremities. She has stopped her gabapentin and is taking Ultram.  No fever, chills, n/v, cough, rash, dizziness, headache, vision changes, SOB, chest pain, palpitations, abdominal pain or changes in bowel or bladder habits. No swelling or tenderness in her extremities. No c/o joint or bone pain.  She has maintained a good appetite and is staying well hydrated. Her weight is stable.   Medications:  Allergies as of 08/02/2016      Reactions   Adhesive [tape] Other (See Comments)   Burn Skin   Codeine Other (See Comments)   paranoid   Zocor [simvastatin - High Dose] Other (See Comments)   Muscle spasms   Penicillins Rash   Has patient had a PCN reaction causing immediate rash, facial/tongue/throat swelling, SOB or lightheadedness with hypotension: Yes Has patient had a PCN reaction causing severe rash involving mucus membranes or skin necrosis: No Has patient had a PCN reaction that required hospitalization does not remember.  Has patient had a PCN reaction occurring within the last 10 years: No If all of the above answers are "NO", then may proceed with Cephalosporin use.      Medication List       Accurate as of 08/02/16  1:46 PM. Always use your most recent med list.          acetic acid 2 % otic solution Commonly known as:  VOSOL Place 4 drops into both ears 3 (three) times daily.   aspirin 81 MG tablet Take 81 mg by mouth at bedtime.   B-D ASSURE BPM/MANUAL ARM CUFF Misc Check blood pressure daily.   dicyclomine  20 MG tablet Commonly known as:  BENTYL Take 1 tablet (20 mg total) by mouth 4 (four) times daily -  before meals and at bedtime.   DULoxetine 60 MG capsule Commonly known as:  CYMBALTA TAKE 1 CAPSULE (60 MG TOTAL) BY MOUTH 2 (TWO) TIMES DAILY.   glimepiride 4 MG tablet Commonly known as:  AMARYL TAKE 1 TABLET(4 MG) BY MOUTH TWICE DAILY   multivitamin with minerals Tabs tablet Take 1 tablet by mouth daily.   ONE TOUCH ULTRA 2 w/Device Kit   ONE TOUCH ULTRA TEST test strip Generic drug:  glucose blood   OT ULTRA/FASTTK CNTRL SOLN Soln   traMADol 50 MG tablet Commonly known as:  ULTRAM Take 1 tablet (50 mg total) by mouth 2 (two) times daily.       Allergies:  Allergies  Allergen Reactions  . Adhesive [Tape] Other (See Comments)    Burn Skin  . Codeine Other (See Comments)    paranoid  . Zocor [Simvastatin - High Dose] Other (See Comments)    Muscle spasms  . Penicillins Rash    Has patient had a PCN reaction causing immediate rash, facial/tongue/throat swelling, SOB or lightheadedness with hypotension: Yes Has patient had a PCN reaction causing severe rash involving mucus membranes or skin necrosis: No Has patient had a PCN reaction that required hospitalization does not remember.  Has patient had  a PCN reaction occurring within the last 10 years: No If all of the above answers are "NO", then may proceed with Cephalosporin use.     Past Medical History, Surgical history, Social history, and Family History were reviewed and updated.  Review of Systems: All other 10 point review of systems is negative.   Physical Exam:  vitals were not taken for this visit.  Wt Readings from Last 3 Encounters:  08/02/16 208 lb (94.3 kg)  06/27/16 199 lb (90.3 kg)  06/21/16 200 lb (90.7 kg)   Ocular: Sclerae unicteric, pupils equal, round and reactive to light Ear-nose-throat: Oropharynx clear, dentition fair Lymphatic: No cervical supraclavicular or axillary  adenopathy Lungs no rales or rhonchi, good excursion bilaterally Heart regular rate and rhythm, no murmur appreciated Abd soft, nontender, positive bowel sounds, no liver or spleen tip palpated on exam, no fluid wave MSK no focal spinal tenderness, no joint edema Neuro: non-focal, well-oriented, appropriate affect Breasts: Deferred  Lab Results  Component Value Date   WBC 6.6 06/27/2016   HGB 11.1 (L) 06/27/2016   HCT 32.1 (L) 06/27/2016   MCV 92 06/27/2016   PLT 387 06/27/2016   Lab Results  Component Value Date   FERRITIN 684 (H) 06/27/2016   IRON 64 06/27/2016   TIBC 174 (L) 06/27/2016   UIBC 110 (L) 06/27/2016   IRONPCTSAT 37 06/27/2016   Lab Results  Component Value Date   RETICCTPCT 1.5 04/28/2014   RBC 3.48 (L) 06/27/2016   RETICCTABS 59.9 04/28/2014   No results found for: KPAFRELGTCHN, LAMBDASER, KAPLAMBRATIO No results found for: IGGSERUM, IGA, IGMSERUM No results found for: Odetta Pink, SPEI   Chemistry      Component Value Date/Time   NA 143 06/27/2016 0933   NA 143 05/13/2016 0851   K 3.2 (L) 06/27/2016 0933   K 4.1 05/13/2016 0851   CL 107 06/27/2016 0933   CO2 28 06/27/2016 0933   CO2 18 (L) 05/13/2016 0851   BUN 9 06/27/2016 0933   BUN 12.5 05/13/2016 0851   CREATININE 1.0 06/27/2016 0933   CREATININE 1.6 (H) 05/13/2016 0851      Component Value Date/Time   CALCIUM 9.3 06/27/2016 0933   CALCIUM 8.8 05/13/2016 0851   ALKPHOS 77 06/27/2016 0933   ALKPHOS 86 05/13/2016 0851   AST 30 06/27/2016 0933   AST 49 (H) 05/13/2016 0851   ALT 16 06/27/2016 0933   ALT 25 05/13/2016 0851   BILITOT 0.60 06/27/2016 0933   BILITOT 0.84 05/13/2016 0851     Impression and Plan: [name] is a 66 yo African [place] female with multiple health issues including history of stage IB stomach cancer. She underwent resection and completed adjuvant radiation and chemotherapy over 8 years ago. So far, she has done  well and there has been no evidence of recurrence.  She has had iron deficiency in the past but this seems to have stabilized. He last infusion was in December 2016. We will see what her iron studies today show.  I will discuss possibly starting her back on Neurontin with [name] in the am.  We will plan to see her back in 6 weeks for labs and follow-up.  She will contact us with any questions or concerns. We can certainly see her sooner if need be.   Eliezer Bottom, NP 1/9/20181:46 PM

## 2016-08-03 ENCOUNTER — Other Ambulatory Visit: Payer: Self-pay | Admitting: Family

## 2016-08-03 DIAGNOSIS — G894 Chronic pain syndrome: Secondary | ICD-10-CM

## 2016-08-03 DIAGNOSIS — G5793 Unspecified mononeuropathy of bilateral lower limbs: Secondary | ICD-10-CM

## 2016-08-03 LAB — FERRITIN: Ferritin: 422 ng/ml — ABNORMAL HIGH (ref 9–269)

## 2016-08-03 LAB — IRON AND TIBC
%SAT: 25 % (ref 21–57)
IRON: 64 ug/dL (ref 41–142)
TIBC: 260 ug/dL (ref 236–444)
UIBC: 196 ug/dL (ref 120–384)

## 2016-08-03 LAB — LIPID PANEL
CHOLESTEROL TOTAL: 196 mg/dL (ref 100–199)
Chol/HDL Ratio: 3.3 ratio units (ref 0.0–4.4)
HDL: 60 mg/dL (ref >39)
LDL CALC: 102 mg/dL — AB (ref 0–99)
TRIGLYCERIDES: 169 mg/dL — AB (ref 0–149)
VLDL Cholesterol Cal: 34 mg/dL (ref 5–40)

## 2016-08-03 MED ORDER — GABAPENTIN 400 MG PO CAPS: 800.0000 mg | ORAL_CAPSULE | Freq: Three times a day (TID) | ORAL | 2 refills | Status: DC

## 2016-08-03 NOTE — Progress Notes (Signed)
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 Patient aware and will pick up later today.

## 2016-08-15 ENCOUNTER — Telehealth: Payer: Self-pay | Admitting: Family Medicine

## 2016-08-15 ENCOUNTER — Other Ambulatory Visit: Payer: Self-pay | Admitting: Family Medicine

## 2016-08-15 DIAGNOSIS — G894 Chronic pain syndrome: Secondary | ICD-10-CM

## 2016-08-15 MED ORDER — TRAMADOL HCL 50 MG PO TABS: 50.0000 mg | ORAL_TABLET | Freq: Four times a day (QID) | ORAL | 0 refills | Status: DC

## 2016-08-15 NOTE — Telephone Encounter (Signed)
Can increase tramadol to qid

## 2016-08-15 NOTE — Telephone Encounter (Signed)
Spoke to the patient and she called pain management today and left a message for Thurmond Butts to call her back , but he is out of the office today.  Patient states if they called they have not left a message.

## 2016-08-15 NOTE — Telephone Encounter (Signed)
°  Relation to BN:[facility] Call back number: [phone] Pharmacy: [facility], [address] [place] [phone] (Phone) [phone] (Fax)     Reason for call:  Patent experiencing hand and joint pain and states traMADol (ULTRAM) 50 MG tablet and gabapentin (NEURONTIN) 400 MG capsule are not working, please advise

## 2016-08-15 NOTE — Telephone Encounter (Signed)
Pt needs pain management--- they tried calling and person who answered phone hung up on them  Pt told me she never heard from them but they did call--- did pt ever call them and reschedule?

## 2016-08-15 NOTE — Telephone Encounter (Signed)
Called the patient informed of PCP instructions. Updated medication list/printed new tramadol/PCP signed/faxed hardcopy to [facility]

## 2016-08-16 ENCOUNTER — Other Ambulatory Visit: Payer: Self-pay | Admitting: Family Medicine

## 2016-08-16 DIAGNOSIS — G894 Chronic pain syndrome: Secondary | ICD-10-CM

## 2016-08-16 NOTE — Telephone Encounter (Signed)
New referral put in

## 2016-08-16 NOTE — Telephone Encounter (Signed)
Patient states pain management no longer has referral and requesting notes, please advise

## 2016-08-27 IMAGING — CR DG CHEST 2V
2 series · 2 of 2 positions shown · non-contrast
Comparison: 10/15/2013 and prior radiographs

CLINICAL DATA: 64-year-old female with acute left chest pain for 2
days with nausea, shortness of breath and headaches.

EXAM:
CHEST  2 VIEW

[w chest pa]
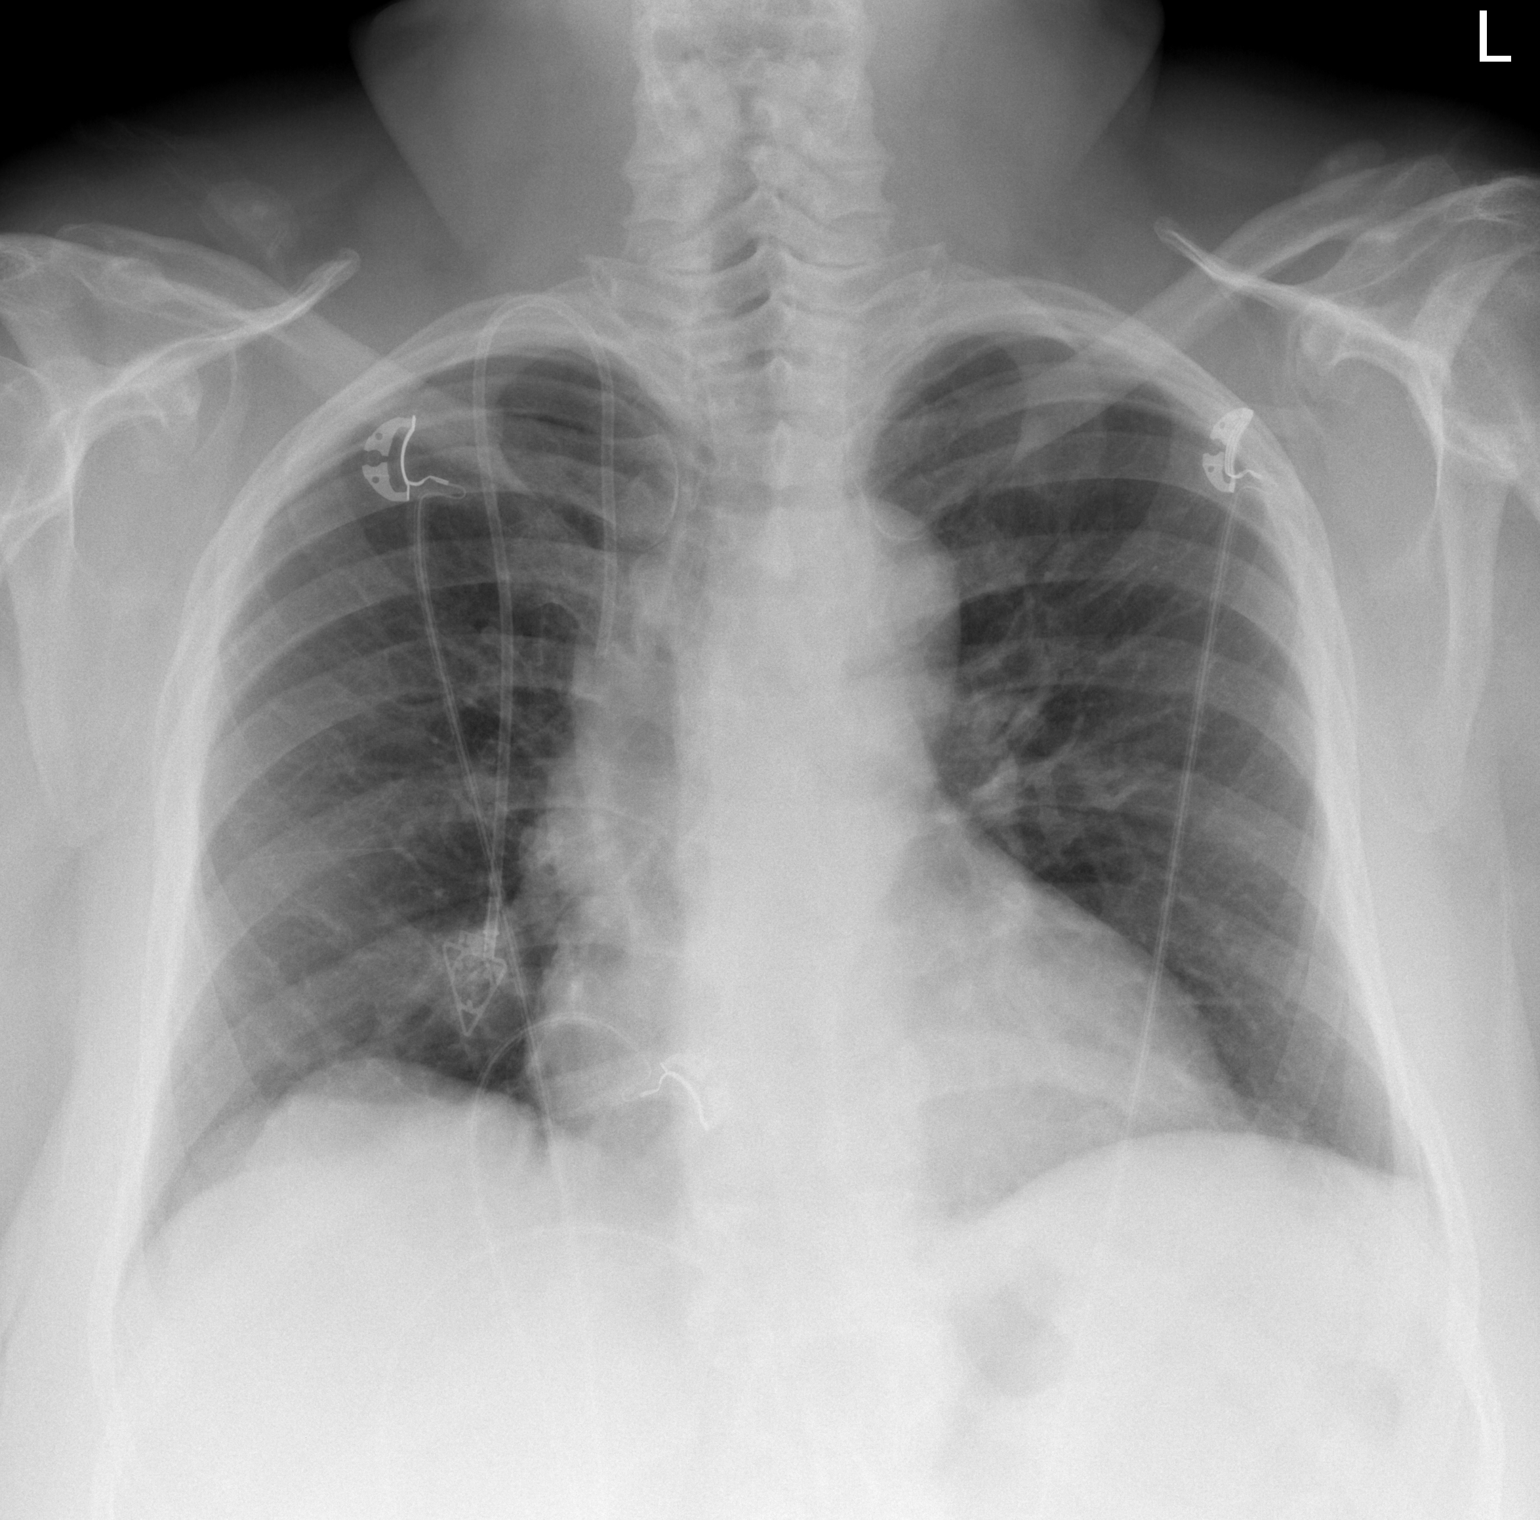

[w chest lat]
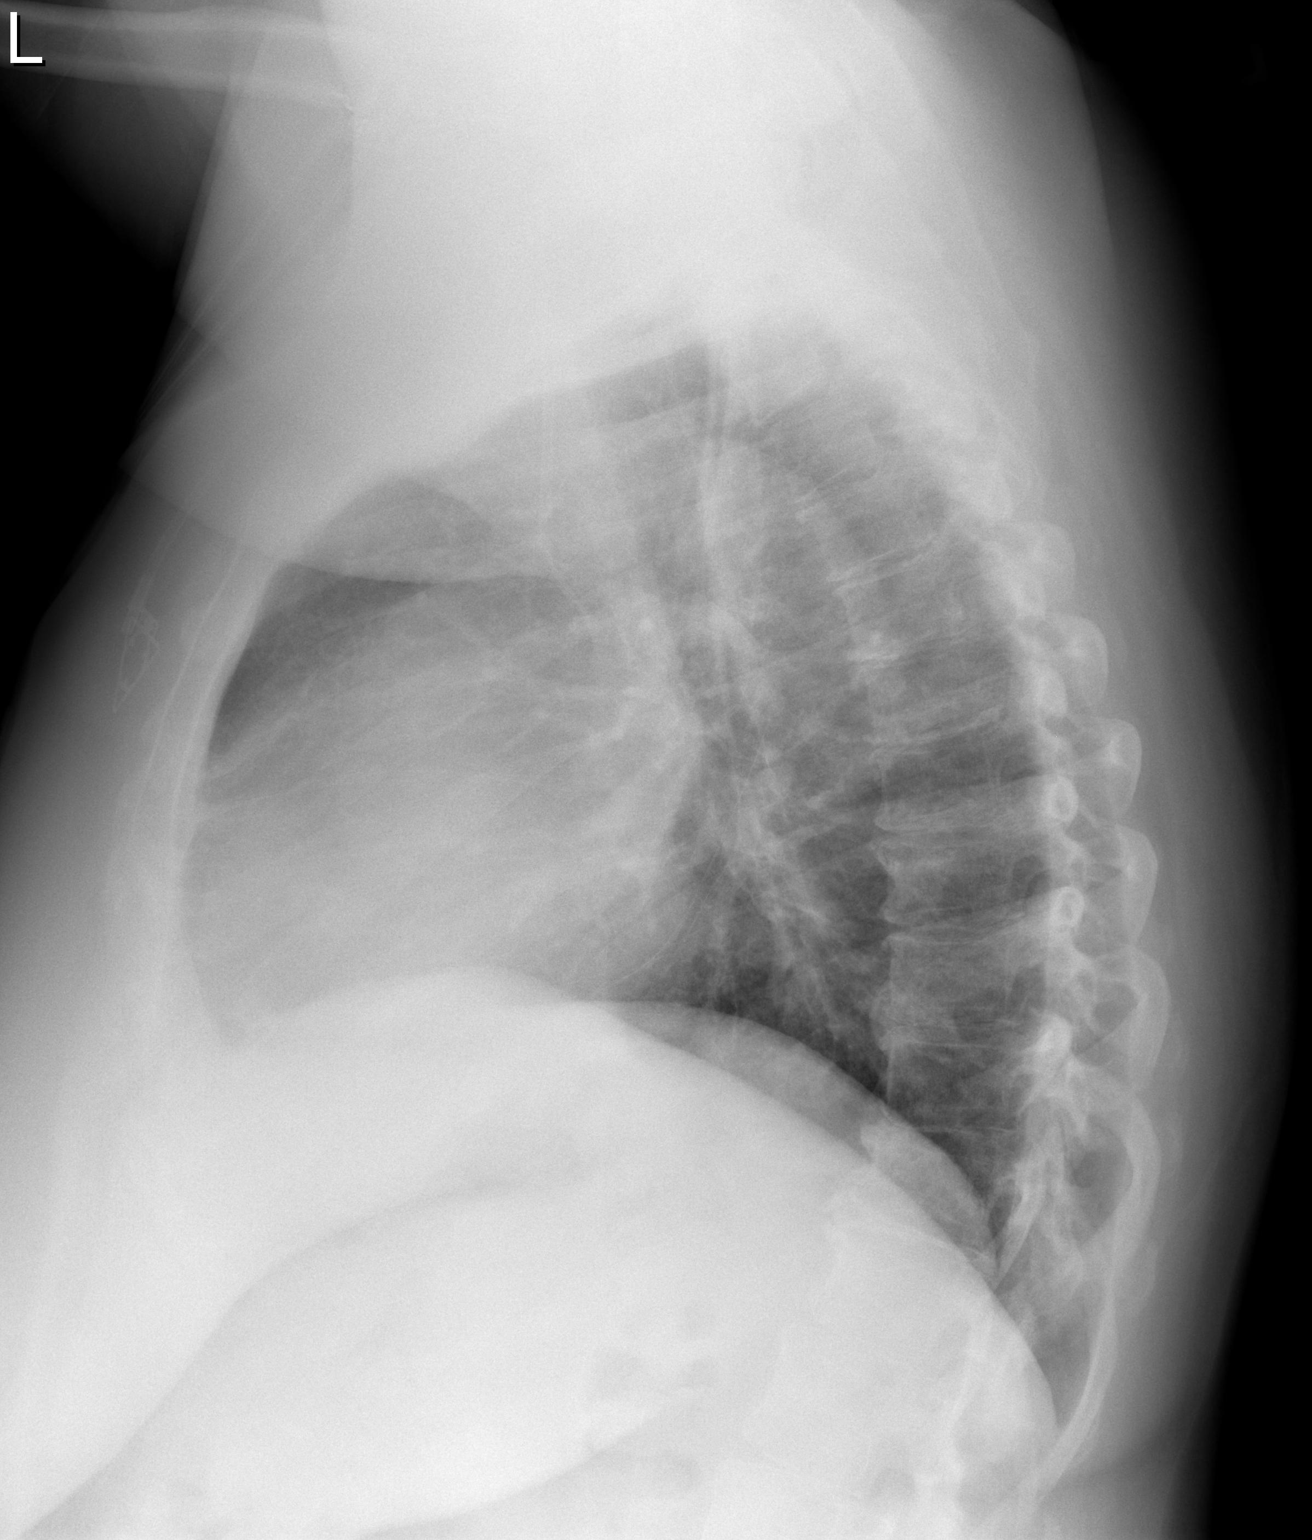

[2 of 2 positions shown; findings below may reference images not displayed]

FINDINGS: The cardiomediastinal silhouette is unremarkable.

A right Port-A-Cath is again identified.

There is no evidence of focal airspace disease, pulmonary edema,
suspicious pulmonary nodule/mass, pleural effusion, or pneumothorax.
No acute bony abnormalities are identified.
IMPRESSION: No active cardiopulmonary disease.

## 2016-09-13 ENCOUNTER — Ambulatory Visit (HOSPITAL_BASED_OUTPATIENT_CLINIC_OR_DEPARTMENT_OTHER): Payer: Medicare HMO | Admitting: Family

## 2016-09-13 ENCOUNTER — Ambulatory Visit (HOSPITAL_BASED_OUTPATIENT_CLINIC_OR_DEPARTMENT_OTHER): Payer: Medicare HMO

## 2016-09-13 ENCOUNTER — Other Ambulatory Visit (HOSPITAL_BASED_OUTPATIENT_CLINIC_OR_DEPARTMENT_OTHER): Payer: Medicare HMO

## 2016-09-13 VITALS — BP 122/71 | HR 87 | Temp 98.5°F | Resp 16 | Wt 210.0 lb

## 2016-09-13 DIAGNOSIS — C162 Malignant neoplasm of body of stomach: Secondary | ICD-10-CM

## 2016-09-13 DIAGNOSIS — Z85028 Personal history of other malignant neoplasm of stomach: Secondary | ICD-10-CM

## 2016-09-13 DIAGNOSIS — Z95828 Presence of other vascular implants and grafts: Secondary | ICD-10-CM

## 2016-09-13 DIAGNOSIS — Z862 Personal history of diseases of the blood and blood-forming organs and certain disorders involving the immune mechanism: Secondary | ICD-10-CM | POA: Diagnosis not present

## 2016-09-13 DIAGNOSIS — R1012 Left upper quadrant pain: Secondary | ICD-10-CM | POA: Diagnosis not present

## 2016-09-13 DIAGNOSIS — D508 Other iron deficiency anemias: Secondary | ICD-10-CM

## 2016-09-13 LAB — COMPREHENSIVE METABOLIC PANEL
ALBUMIN: 3.2 g/dL — AB (ref 3.5–5.0)
ALK PHOS: 103 U/L (ref 40–150)
ALT: 14 U/L (ref 0–55)
ANION GAP: 10 meq/L (ref 3–11)
AST: 15 U/L (ref 5–34)
BILIRUBIN TOTAL: 0.22 mg/dL (ref 0.20–1.20)
BUN: 26.3 mg/dL — ABNORMAL HIGH (ref 7.0–26.0)
CO2: 20 meq/L — AB (ref 22–29)
CREATININE: 1.3 mg/dL — AB (ref 0.6–1.1)
Calcium: 9.3 mg/dL (ref 8.4–10.4)
Chloride: 107 mEq/L (ref 98–109)
EGFR: 49 mL/min/{1.73_m2} — AB (ref >90)
GLUCOSE: 406 mg/dL — AB (ref 70–140)
Potassium: 4.7 mEq/L (ref 3.5–5.1)
Sodium: 138 mEq/L (ref 136–145)
TOTAL PROTEIN: 7 g/dL (ref 6.4–8.3)

## 2016-09-13 LAB — CBC WITH DIFFERENTIAL (CANCER CENTER ONLY)
BASO#: 0 10*3/uL (ref 0.0–0.2)
BASO%: 0.5 % (ref 0.0–2.0)
EOS ABS: 0.4 10*3/uL (ref 0.0–0.5)
EOS%: 6.3 % (ref 0.0–7.0)
HCT: 36.6 % (ref 34.8–46.6)
HEMOGLOBIN: 12.3 g/dL (ref 11.6–15.9)
LYMPH#: 2.2 10*3/uL (ref 0.9–3.3)
LYMPH%: 33.6 % (ref 14.0–48.0)
MCH: 30.3 pg (ref 26.0–34.0)
MCHC: 33.6 g/dL (ref 32.0–36.0)
MCV: 90 fL (ref 81–101)
MONO#: 0.6 10*3/uL (ref 0.1–0.9)
MONO%: 8.9 % (ref 0.0–13.0)
NEUT%: 50.7 % (ref 39.6–80.0)
NEUTROS ABS: 3.4 10*3/uL (ref 1.5–6.5)
PLATELETS: 297 10*3/uL (ref 145–400)
RBC: 4.06 10*6/uL (ref 3.70–5.32)
RDW: 12.3 % (ref 11.1–15.7)
WBC: 6.6 10*3/uL (ref 3.9–10.0)

## 2016-09-13 MED ORDER — SODIUM CHLORIDE 0.9% FLUSH
10.0000 mL | INTRAVENOUS | Status: AC | PRN
  Administered 2016-09-13: 10 mL via INTRAVENOUS
  Filled 2016-09-13: qty 10

## 2016-09-13 MED ORDER — HEPARIN SOD (PORK) LOCK FLUSH 100 UNIT/ML IV SOLN
500.0000 [IU] | Freq: Once | INTRAVENOUS | Status: AC
  Administered 2016-09-13: 500 [IU] via INTRAVENOUS
  Filled 2016-09-13: qty 5

## 2016-09-13 NOTE — Progress Notes (Signed)
Hematology and Oncology Follow Up Visit  [name] [id number] Nov 13, 1950 66 y.o. 09/13/2016   Principle Diagnosis:  Stage IB (T1, N1, M0) adenocarcinoma of the stomach Iron deficiency anemia  Current Therapy:   Observation IV iron as indicated - last received in December 2016     Interim History:  [name] is here today for a follow-up. She is c/o mid to left upper quadrant pain on palpation.  She has generalized aches and pains due to arthritis that comes and go. She is scheduled to see her doctor at the pain clinic on March 7th. She also plans to talk to him about the increase in her back and left shoulder pain.  No swelling or tenderness in her extremities. No fever, chills, n/v, cough, rash, dizziness, vision changes, SOB, chest pain, palpitations or changes in bowel or bladder habits. She has maintained a great appetite and is staying well hydrated. Her weight is stable.   Medications:  Allergies as of 09/13/2016      Reactions   Adhesive [tape] Other (See Comments)   Burn Skin   Codeine Other (See Comments)   paranoid   Zocor [simvastatin - High Dose] Other (See Comments)   Muscle spasms   Penicillins Rash   Has patient had a PCN reaction causing immediate rash, facial/tongue/throat swelling, SOB or lightheadedness with hypotension: Yes Has patient had a PCN reaction causing severe rash involving mucus membranes or skin necrosis: No Has patient had a PCN reaction that required hospitalization does not remember.  Has patient had a PCN reaction occurring within the last 10 years: No If all of the above answers are "NO", then may proceed with Cephalosporin use.      Medication List       Accurate as of 09/13/16 10:56 AM. Always use your most recent med list.          acetic acid 2 % otic solution Commonly known as:  VOSOL Place 4 drops into both ears 3 (three) times daily.   aspirin 81 MG tablet Take 81 mg by mouth at bedtime.   B-D ASSURE BPM/MANUAL ARM CUFF  Misc Check blood pressure daily.   dicyclomine 20 MG tablet Commonly known as:  BENTYL Take 1 tablet (20 mg total) by mouth 4 (four) times daily -  before meals and at bedtime.   DULoxetine 60 MG capsule Commonly known as:  CYMBALTA TAKE 1 CAPSULE (60 MG TOTAL) BY MOUTH 2 (TWO) TIMES DAILY.   gabapentin 400 MG capsule Commonly known as:  NEURONTIN Take 2 capsules (800 mg total) by mouth 3 (three) times daily.   glimepiride 4 MG tablet Commonly known as:  AMARYL TAKE 1 TABLET(4 MG) BY MOUTH TWICE DAILY   multivitamin with minerals Tabs tablet Take 1 tablet by mouth daily.   ONE TOUCH ULTRA 2 w/Device Kit   ONE TOUCH ULTRA TEST test strip Generic drug:  glucose blood   OT ULTRA/FASTTK CNTRL SOLN Soln   traMADol 50 MG tablet Commonly known as:  ULTRAM Take 1 tablet (50 mg total) by mouth 4 (four) times daily.       Allergies:  Allergies  Allergen Reactions  . Adhesive [Tape] Other (See Comments)    Burn Skin  . Codeine Other (See Comments)    paranoid  . Zocor [Simvastatin - High Dose] Other (See Comments)    Muscle spasms  . Penicillins Rash    Has patient had a PCN reaction causing immediate rash, facial/tongue/throat swelling, SOB or lightheadedness  with hypotension: Yes Has patient had a PCN reaction causing severe rash involving mucus membranes or skin necrosis: No Has patient had a PCN reaction that required hospitalization does not remember.  Has patient had a PCN reaction occurring within the last 10 years: No If all of the above answers are "NO", then may proceed with Cephalosporin use.     Past Medical History, Surgical history, Social history, and Family History were reviewed and updated.  Review of Systems: All other 10 point review of systems is negative.   Physical Exam:  vitals were not taken for this visit.  Wt Readings from Last 3 Encounters:  08/02/16 208 lb (94.3 kg)  06/27/16 199 lb (90.3 kg)  06/21/16 200 lb (90.7 kg)   Ocular:  Sclerae unicteric, pupils equal, round and reactive to light Ear-nose-throat: Oropharynx clear, dentition fair Lymphatic: No cervical supraclavicular or axillary adenopathy Lungs no rales or rhonchi, good excursion bilaterally Heart regular rate and rhythm, no murmur appreciated Abd soft, nontender, positive bowel sounds, no liver or spleen tip palpated on exam, no fluid wave MSK no focal spinal tenderness, no joint edema Neuro: non-focal, well-oriented, appropriate affect Breasts: Deferred  Lab Results  Component Value Date   WBC 6.6 09/13/2016   HGB 12.3 09/13/2016   HCT 36.6 09/13/2016   MCV 90 09/13/2016   PLT 297 09/13/2016   Lab Results  Component Value Date   FERRITIN 422 (H) 08/02/2016   IRON 64 08/02/2016   TIBC 260 08/02/2016   UIBC 196 08/02/2016   IRONPCTSAT 25 08/02/2016   Lab Results  Component Value Date   RETICCTPCT 1.5 04/28/2014   RBC 4.06 09/13/2016   RETICCTABS 59.9 04/28/2014   No results found for: KPAFRELGTCHN, LAMBDASER, KAPLAMBRATIO No results found for: IGGSERUM, IGA, IGMSERUM No results found for: [name], SPEI   Chemistry      Component Value Date/Time   NA 140 08/02/2016 1351   NA 143 05/13/2016 0851   K 3.8 08/02/2016 1351   K 4.1 05/13/2016 0851   CL 101 08/02/2016 1351   CO2 27 08/02/2016 1351   CO2 18 (L) 05/13/2016 0851   BUN 11 08/02/2016 1351   BUN 12.5 05/13/2016 0851   CREATININE 1.2 08/02/2016 1351   CREATININE 1.6 (H) 05/13/2016 0851      Component Value Date/Time   CALCIUM 9.5 08/02/2016 1351   CALCIUM 8.8 05/13/2016 0851   ALKPHOS 71 08/02/2016 1351   ALKPHOS 86 05/13/2016 0851   AST 25 08/02/2016 1351   AST 49 (H) 05/13/2016 0851   ALT 19 08/02/2016 1351   ALT 25 05/13/2016 0851   BILITOT 0.60 08/02/2016 1351   BILITOT 0.84 05/13/2016 0851     Impression and Plan: [name] is a 66 yo African [place] female with multiple health issues including history of  stage IB stomach cancer. She underwent resection and completed adjuvant radiation and chemotherapy over 8 years ago. So far, she has done well and there has been no evidence of recurrence.  Her iron studies have also been stable and she has not required an infusion in quite a while.  She is c/o abdominal pain in the mid abdomen and left upper quadrant. We will get and abdominal US to better assess for cause.  We will go ahead and plan to see her back in 2 months for repeat lab work and follow-up.  She will contact us with any questions or concerns. We can certainly see her  sooner if need be.   Eliezer Bottom, NP 2/20/201810:56 AM

## 2016-09-13 NOTE — Addendum Note (Signed)
Addended by: Smiley Houseman F on: 09/13/2016 11:52 AM   Modules accepted: Orders, SmartSet

## 2016-09-14 ENCOUNTER — Ambulatory Visit (HOSPITAL_BASED_OUTPATIENT_CLINIC_OR_DEPARTMENT_OTHER)
Admission: RE | Admit: 2016-09-14 | Discharge: 2016-09-14 | Disposition: A | Discharge status: Home / Self Care | Payer: Medicare HMO | Source: Ambulatory Visit | Attending: Family | Admitting: Family

## 2016-09-14 DIAGNOSIS — C162 Malignant neoplasm of body of stomach: Secondary | ICD-10-CM

## 2016-09-14 DIAGNOSIS — Z85028 Personal history of other malignant neoplasm of stomach: Secondary | ICD-10-CM | POA: Diagnosis not present

## 2016-09-14 DIAGNOSIS — R1012 Left upper quadrant pain: Secondary | ICD-10-CM | POA: Insufficient documentation

## 2016-09-15 ENCOUNTER — Other Ambulatory Visit: Payer: Self-pay | Admitting: Family

## 2016-09-15 DIAGNOSIS — R1012 Left upper quadrant pain: Secondary | ICD-10-CM

## 2016-09-15 DIAGNOSIS — C162 Malignant neoplasm of body of stomach: Secondary | ICD-10-CM

## 2016-09-16 ENCOUNTER — Other Ambulatory Visit: Payer: Self-pay | Admitting: Family

## 2016-09-16 DIAGNOSIS — R1012 Left upper quadrant pain: Secondary | ICD-10-CM

## 2016-09-16 DIAGNOSIS — C162 Malignant neoplasm of body of stomach: Secondary | ICD-10-CM

## 2016-09-24 ENCOUNTER — Encounter (HOSPITAL_BASED_OUTPATIENT_CLINIC_OR_DEPARTMENT_OTHER): Payer: Self-pay

## 2016-09-24 ENCOUNTER — Ambulatory Visit (HOSPITAL_BASED_OUTPATIENT_CLINIC_OR_DEPARTMENT_OTHER)
Admission: RE | Admit: 2016-09-24 | Discharge: 2016-09-24 | Disposition: A | Discharge status: Home / Self Care | Payer: Medicare HMO | Source: Ambulatory Visit | Attending: Family | Admitting: Family

## 2016-09-24 DIAGNOSIS — R1012 Left upper quadrant pain: Secondary | ICD-10-CM

## 2016-09-24 DIAGNOSIS — I700 Atherosclerosis of aorta: Secondary | ICD-10-CM | POA: Insufficient documentation

## 2016-09-24 DIAGNOSIS — R109 Unspecified abdominal pain: Secondary | ICD-10-CM | POA: Diagnosis not present

## 2016-09-24 DIAGNOSIS — Z96641 Presence of right artificial hip joint: Secondary | ICD-10-CM | POA: Insufficient documentation

## 2016-09-24 DIAGNOSIS — C162 Malignant neoplasm of body of stomach: Secondary | ICD-10-CM | POA: Insufficient documentation

## 2016-09-24 DIAGNOSIS — Z9049 Acquired absence of other specified parts of digestive tract: Secondary | ICD-10-CM | POA: Insufficient documentation

## 2016-09-24 MED ORDER — IOPAMIDOL (ISOVUE-300) INJECTION 61%
100.0000 mL | Freq: Once | INTRAVENOUS | Status: AC | PRN
  Administered 2016-09-24: 80 mL via INTRAVENOUS

## 2016-09-26 ENCOUNTER — Telehealth: Payer: Self-pay | Admitting: Family

## 2016-09-26 NOTE — Telephone Encounter (Signed)
Went over scan results with [name]. She is still having some intermittent abdominal discomfort but describes it as mild and tolerable. All questions were answered and she will contact our office if anything changes.

## 2016-09-28 DIAGNOSIS — E1142 Type 2 diabetes mellitus with diabetic polyneuropathy: Secondary | ICD-10-CM | POA: Diagnosis not present

## 2016-09-28 DIAGNOSIS — G620 Drug-induced polyneuropathy: Secondary | ICD-10-CM | POA: Diagnosis not present

## 2016-09-28 DIAGNOSIS — G894 Chronic pain syndrome: Secondary | ICD-10-CM | POA: Diagnosis not present

## 2016-09-28 DIAGNOSIS — Z79891 Long term (current) use of opiate analgesic: Secondary | ICD-10-CM | POA: Diagnosis not present

## 2016-09-28 DIAGNOSIS — R69 Illness, unspecified: Secondary | ICD-10-CM | POA: Diagnosis not present

## 2016-09-29 ENCOUNTER — Other Ambulatory Visit: Payer: Self-pay | Admitting: Family Medicine

## 2016-09-29 DIAGNOSIS — F32A Depression, unspecified: Secondary | ICD-10-CM

## 2016-09-29 DIAGNOSIS — F329 Major depressive disorder, single episode, unspecified: Secondary | ICD-10-CM

## 2016-09-29 DIAGNOSIS — E1165 Type 2 diabetes mellitus with hyperglycemia: Principal | ICD-10-CM

## 2016-09-29 DIAGNOSIS — E1151 Type 2 diabetes mellitus with diabetic peripheral angiopathy without gangrene: Secondary | ICD-10-CM

## 2016-09-29 DIAGNOSIS — G894 Chronic pain syndrome: Secondary | ICD-10-CM

## 2016-09-29 DIAGNOSIS — IMO0002 Reserved for concepts with insufficient information to code with codable children: Secondary | ICD-10-CM

## 2016-09-29 MED ORDER — DULOXETINE HCL 60 MG PO CPEP: 60.0000 mg | ORAL_CAPSULE | Freq: Two times a day (BID) | ORAL | 0 refills | Status: DC

## 2016-09-29 MED ORDER — GLIMEPIRIDE 4 MG PO TABS: 4.0000 mg | ORAL_TABLET | Freq: Two times a day (BID) | ORAL | 2 refills | Status: DC

## 2016-09-29 MED ORDER — TRAMADOL HCL 50 MG PO TABS: 50.0000 mg | ORAL_TABLET | Freq: Four times a day (QID) | ORAL | 0 refills | Status: DC

## 2016-09-29 NOTE — Telephone Encounter (Signed)
Faxed hardcopy for Tramadol to [facility]

## 2016-09-29 NOTE — Telephone Encounter (Signed)
Last office visit 04/20/2016--no upcoming appts. Last refill 08/15/2016  #120 with 0 refills No UDS/No contract

## 2016-10-04 ENCOUNTER — Telehealth: Payer: Self-pay | Admitting: Family Medicine

## 2016-10-04 NOTE — Telephone Encounter (Signed)
Patient informed of PCP instructions. 

## 2016-10-04 NOTE — Telephone Encounter (Signed)
Pt called in because she is experiencing a cold, cough, sore throat. Pt says that she does not have a fever. Pt would like to know if provider would call her something in to pharmacy? OR will she have to come in to be seen first?   CB: [place], [place] - [place] AT [place]

## 2016-10-04 NOTE — Telephone Encounter (Signed)
Can use mucinex or mucinex dm,  xyzal otc Ov if she thinks she needs abx

## 2016-10-05 ENCOUNTER — Encounter: Payer: Self-pay | Admitting: Medical

## 2016-10-05 ENCOUNTER — Telehealth: Payer: Self-pay | Admitting: Medical

## 2016-10-05 ENCOUNTER — Ambulatory Visit (INDEPENDENT_AMBULATORY_CARE_PROVIDER_SITE_OTHER): Payer: Medicare HMO | Admitting: Medical

## 2016-10-05 VITALS — BP 122/85 | HR 107 | Temp 98.9°F | Resp 16 | Ht 67.0 in | Wt 216.5 lb

## 2016-10-05 DIAGNOSIS — M791 Myalgia, unspecified site: Secondary | ICD-10-CM

## 2016-10-05 DIAGNOSIS — J029 Acute pharyngitis, unspecified: Secondary | ICD-10-CM

## 2016-10-05 DIAGNOSIS — H6690 Otitis media, unspecified, unspecified ear: Secondary | ICD-10-CM | POA: Diagnosis not present

## 2016-10-05 DIAGNOSIS — J40 Bronchitis, not specified as acute or chronic: Secondary | ICD-10-CM

## 2016-10-05 LAB — POCT RAPID STREP A (OFFICE): Rapid Strep A Screen: POSITIVE — AB

## 2016-10-05 MED ORDER — BENZONATATE 100 MG PO CAPS: 100.0000 mg | ORAL_CAPSULE | Freq: Three times a day (TID) | ORAL | 0 refills | Status: DC | PRN

## 2016-10-05 MED ORDER — AZITHROMYCIN 250 MG PO TABS: ORAL_TABLET | ORAL | 0 refills | Status: DC

## 2016-10-05 MED ORDER — FLUTICASONE PROPIONATE 50 MCG/ACT NA SUSP: 2.0000 | Freq: Every day | NASAL | 1 refills | Status: DC

## 2016-10-05 NOTE — Progress Notes (Signed)
Pre visit review using our clinic review tool, if applicable. No additional management support is needed unless otherwise documented below in the visit note/SLS  

## 2016-10-05 NOTE — Progress Notes (Signed)
Subjective:    Patient ID: [name], female    DOB: [birth year], 66 y.o.   MRN: [id number]  HPI  Pt in with some recent st. One week of moderate st. Pain not improving. Also ear pressure and nasal congestion for one week. Sinus pressure as well. Pt at time feels chills and sweats at night. Some bodyaches.  Pt has grandchildren. She has strep 3 weeks ago.  Left ear pain moderate.      Review of Systems  Constitutional: Negative for chills and fatigue.  HENT: Positive for congestion, ear pain and sinus pressure. Negative for mouth sores and sinus pain.   Respiratory: Positive for cough. Negative for chest tightness, shortness of breath and wheezing.        Mild productive at times.  Cardiovascular: Negative for chest pain and palpitations.  Gastrointestinal: Negative for abdominal pain.  Musculoskeletal: Negative for back pain.  Skin: Negative for rash.  Neurological: Negative for dizziness, numbness and headaches.  Hematological: Negative for adenopathy. Does not bruise/bleed easily.  Psychiatric/Behavioral: Negative for behavioral problems and confusion.    Past Medical History:  Diagnosis Date  . Asthma   . Degenerative joint disease   . Diabetes mellitus   . Dyspnea   . GERD (gastroesophageal reflux disease)   . Hyperlipidemia    no longer on medication for this  . Hypertension    patient denies  . Neuropathy ([place])    bilateral hands and feet  . Sickle cell trait ([place])   . Sleep apnea   . stomach ca dx'd 2010   chemo/xrt comp 05/2009  . TIA (transient ischemic attack) 07/2015     Social History   Social History  . Marital status: Divorced    Spouse name: N/A  . Number of children: N/A  . Years of education: N/A   Occupational History  . Not on file.   Social History Main Topics  . Smoking status: Former Smoker    Packs/day: 1.00    Years: 15.00    Types: Cigarettes    Start date: 09/27/1973    Quit date: 10/15/1988  . Smokeless tobacco: Never  Used     Comment: quit smoking 14 years ago  . Alcohol use No  . Drug use: No  . Sexual activity: No   Other Topics Concern  . Not on file   Social History Narrative  . No narrative on file    Past Surgical History:  Procedure Laterality Date  . APPENDECTOMY    . CESAREAN SECTION    . COLON SURGERY     colonscopy  . COLONOSCOPY WITH PROPOFOL N/A 06/01/2016   Procedure: COLONOSCOPY WITH PROPOFOL;  Surgeon: Wilford Corner, MD;  Location[facility] ENDOSCOPY;  Service: Endoscopy;  Laterality: N/A;  . ESOPHAGOGASTRODUODENOSCOPY N/A 10/15/2012   Procedure: ESOPHAGOGASTRODUODENOSCOPY (EGD);  Surgeon: Lear Ng, MD;  Location: [facility] ENDOSCOPY;  Service: Endoscopy;  Laterality: N/A;  . ESOPHAGOGASTRODUODENOSCOPY N/A 01/15/2016   Procedure: ESOPHAGOGASTRODUODENOSCOPY (EGD);  Surgeon: Ronald Lobo, MD;  Location: [facility] ENDOSCOPY;  Service: Endoscopy;  Laterality: N/A;  . HERNIA REPAIR    . SHOULDER SURGERY     Left  . TOTAL HIP ARTHROPLASTY     Right  . TOTAL KNEE ARTHROPLASTY     Left    Family History  Problem Relation Age of Onset  . Diabetes Brother   . Alzheimer's disease Father 72  . Cancer Mother 47  . Diabetes Maternal Grandmother   . Cancer Maternal Grandfather  lung  . Suicidality Neg Hx   . Depression Neg Hx   . Dementia Neg Hx   . Anxiety disorder Neg Hx     Allergies  Allergen Reactions  . Adhesive [Tape] Other (See Comments)    Burn Skin  . Codeine Other (See Comments)    paranoid  . Zocor [Simvastatin - High Dose] Other (See Comments)    Muscle spasms  . Penicillins Rash    Has patient had a PCN reaction causing immediate rash, facial/tongue/throat swelling, SOB or lightheadedness with hypotension: Yes Has patient had a PCN reaction causing severe rash involving mucus membranes or skin necrosis: No Has patient had a PCN reaction that required hospitalization does not remember.  Has patient had a PCN reaction occurring within the last 10 years: No If  all of the above answers are "NO", then may proceed with Cephalosporin use.     Current Outpatient Prescriptions on File Prior to Visit  Medication Sig Dispense Refill  . aspirin 81 MG tablet Take 81 mg by mouth at bedtime.    . Blood Glucose Calibration (OT ULTRA/FASTTK CNTRL SOLN) SOLN     . Blood Glucose Monitoring Suppl (ONE TOUCH ULTRA 2) w/Device KIT     . Blood Pressure Monitoring (B-D ASSURE BPM/MANUAL ARM CUFF) MISC Check blood pressure daily. 1 each 0  . DULoxetine (CYMBALTA) 60 MG capsule Take 1 capsule (60 mg total) by mouth 2 (two) times daily. 180 capsule 0  . gabapentin (NEURONTIN) 400 MG capsule Take 2 capsules (800 mg total) by mouth 3 (three) times daily. (Patient taking differently: Take 1,600 mg by mouth 3 (three) times daily. ) 180 capsule 2  . glimepiride (AMARYL) 4 MG tablet Take 1 tablet (4 mg total) by mouth 2 (two) times daily. 60 tablet 2  . Multiple Vitamin (MULTIVITAMIN WITH MINERALS) TABS tablet Take 1 tablet by mouth daily.    . ONE TOUCH ULTRA TEST test strip     . traMADol (ULTRAM) 50 MG tablet Take 1 tablet (50 mg total) by mouth 4 (four) times daily. 120 tablet 0   Current Facility-Administered Medications on File Prior to Visit  Medication Dose Route Frequency Provider Last Rate Last Dose  . sodium chloride 0.9 % injection 10 mL  10 mL Intravenous PRN Volanda Napoleon, MD   10 mL at 06/27/16 1138  . sodium chloride flush (NS) 0.9 % injection 10 mL  10 mL Intravenous PRN Eliezer Bottom, NP   10 mL at 09/13/16 1146    BP 122/85 (BP Location: Left Arm, Patient Position: Sitting, Cuff Size: Large)   Pulse (!) 107   Temp 98.9 F (37.2 C) (Oral)   Resp 16   Ht 5' 7" (1.702 m)   Wt 216 lb 8 oz (98.2 kg)   SpO2 100%   BMI 33.91 kg/m      Objective:   Physical Exam  General  Mental Status - Alert. General Appearance - Well groomed. Not in acute distress.  Skin Rashes- No Rashes.  HEENT Head- Normal. Ear Auditory Canal - Left- Normal. Right  - Normal.Tympanic Membrane- Left- moderate red and dull  Right- Normal. Eye Sclera/Conjunctiva- Left- Normal. Right- Normal. Nose & Sinuses Nasal Mucosa- Left-  Boggy and Congested. Right-  Boggy and  Congested.Bilateral no  maxillary and no  frontal sinus pressure. Mouth & Throat Lips: Upper Lip- Normal: no dryness, cracking, pallor, cyanosis, or vesicular eruption. Lower Lip-Normal: no dryness, cracking, pallor, cyanosis or vesicular eruption. Buccal Mucosa- Bilateral-  No Aphthous ulcers. Oropharynx- No Discharge or Erythema. Tonsils: Characteristics- Bilateral-  Erythema moderate. Size/Enlargement- Bilateral- No enlargement. Discharge- bilateral-None.  Neck Neck- Supple. No Masses.    Chest and Lung Exam Auscultation: Breath Sounds:-Clear even and unlabored.  Cardiovascular Auscultation:Rythm- Regular, rate and rhythm. Murmurs & Other Heart Sounds:Ausculatation of the heart reveal- No Murmurs.  Lymphatic Head & Neck General Head & Neck Lymphatics: Bilateral: Description- No Localized lymphadenopathy.  Past Medical History:  Diagnosis Date  . Asthma   . Degenerative joint disease   . Diabetes mellitus   . Dyspnea   . GERD (gastroesophageal reflux disease)   . Hyperlipidemia    no longer on medication for this  . Hypertension    patient denies  . Neuropathy ([place])    bilateral hands and feet  . Sickle cell trait ([place])   . Sleep apnea   . stomach ca dx'd 2010   chemo/xrt comp 05/2009  . TIA (transient ischemic attack) 07/2015     Social History   Social History  . Marital status: Divorced    Spouse name: N/A  . Number of children: N/A  . Years of education: N/A   Occupational History  . Not on file.   Social History Main Topics  . Smoking status: Former Smoker    Packs/day: 1.00    Years: 15.00    Types: Cigarettes    Start date: 09/27/1973    Quit date: 10/15/1988  . Smokeless tobacco: Never Used     Comment: quit smoking 14 years ago  . Alcohol use No    . Drug use: No  . Sexual activity: No   Other Topics Concern  . Not on file   Social History Narrative  . No narrative on file    Past Surgical History:  Procedure Laterality Date  . APPENDECTOMY    . CESAREAN SECTION    . COLON SURGERY     colonscopy  . COLONOSCOPY WITH PROPOFOL N/A 06/01/2016   Procedure: COLONOSCOPY WITH PROPOFOL;  Surgeon: Wilford Corner, MD;  Location: [facility] ENDOSCOPY;  Service: Endoscopy;  Laterality: N/A;  . ESOPHAGOGASTRODUODENOSCOPY N/A 10/15/2012   Procedure: ESOPHAGOGASTRODUODENOSCOPY (EGD);  Surgeon: Lear Ng, MD;  Location: [facility] ENDOSCOPY;  Service: Endoscopy;  Laterality: N/A;  . ESOPHAGOGASTRODUODENOSCOPY N/A 01/15/2016   Procedure: ESOPHAGOGASTRODUODENOSCOPY (EGD);  Surgeon: Ronald Lobo, MD;  Location: [facility] ENDOSCOPY;  Service: Endoscopy;  Laterality: N/A;  . HERNIA REPAIR    . SHOULDER SURGERY     Left  . TOTAL HIP ARTHROPLASTY     Right  . TOTAL KNEE ARTHROPLASTY     Left    Family History  Problem Relation Age of Onset  . Diabetes Brother   . Alzheimer's disease Father 31  . Cancer Mother 44  . Diabetes Maternal Grandmother   . Cancer Maternal Grandfather     lung  . Suicidality Neg Hx   . Depression Neg Hx   . Dementia Neg Hx   . Anxiety disorder Neg Hx     Allergies  Allergen Reactions  . Adhesive [Tape] Other (See Comments)    Burn Skin  . Codeine Other (See Comments)    paranoid  . Zocor [Simvastatin - High Dose] Other (See Comments)    Muscle spasms  . Penicillins Rash    Has patient had a PCN reaction causing immediate rash, facial/tongue/throat swelling, SOB or lightheadedness with hypotension: Yes Has patient had a PCN reaction causing severe rash involving mucus membranes or skin necrosis: No Has patient had  a PCN reaction that required hospitalization does not remember.  Has patient had a PCN reaction occurring within the last 10 years: No If all of the above answers are "NO", then may proceed with  Cephalosporin use.     Current Outpatient Prescriptions on File Prior to Visit  Medication Sig Dispense Refill  . aspirin 81 MG tablet Take 81 mg by mouth at bedtime.    . Blood Glucose Calibration (OT ULTRA/FASTTK CNTRL SOLN) SOLN     . Blood Glucose Monitoring Suppl (ONE TOUCH ULTRA 2) w/Device KIT     . Blood Pressure Monitoring (B-D ASSURE BPM/MANUAL ARM CUFF) MISC Check blood pressure daily. 1 each 0  . DULoxetine (CYMBALTA) 60 MG capsule Take 1 capsule (60 mg total) by mouth 2 (two) times daily. 180 capsule 0  . gabapentin (NEURONTIN) 400 MG capsule Take 2 capsules (800 mg total) by mouth 3 (three) times daily. (Patient taking differently: Take 1,600 mg by mouth 3 (three) times daily. ) 180 capsule 2  . glimepiride (AMARYL) 4 MG tablet Take 1 tablet (4 mg total) by mouth 2 (two) times daily. 60 tablet 2  . Multiple Vitamin (MULTIVITAMIN WITH MINERALS) TABS tablet Take 1 tablet by mouth daily.    . ONE TOUCH ULTRA TEST test strip     . traMADol (ULTRAM) 50 MG tablet Take 1 tablet (50 mg total) by mouth 4 (four) times daily. 120 tablet 0   Current Facility-Administered Medications on File Prior to Visit  Medication Dose Route Frequency Provider Last Rate Last Dose  . sodium chloride 0.9 % injection 10 mL  10 mL Intravenous PRN Volanda Napoleon, MD   10 mL at 06/27/16 1138  . sodium chloride flush (NS) 0.9 % injection 10 mL  10 mL Intravenous PRN Eliezer Bottom, NP   10 mL at 09/13/16 1146    BP 122/85 (BP Location: Left Arm, Patient Position: Sitting, Cuff Size: Large)   Pulse (!) 107   Temp 98.9 F (37.2 C) (Oral)   Resp 16   Ht 5' 7" (1.702 m)   Wt 216 lb 8 oz (98.2 kg)   SpO2 100%   BMI 33.91 kg/m        Assessment & Plan:  Your strep test was positive and your left ear appears infected as well. Rx azithromycin since allergic to pcn. If either of these area perist or worsen let us know.  Some symptoms of bronchitis and azithromycin has good lung coverage as  well.  For nasal congestion rx flonase.  For cough rx benzonatate.  If you cough worsens or chest congestion become predominant symptom then let us know and will get cxr  Follow up in 7 days or as needed  , Percell Miller, [employer]

## 2016-10-05 NOTE — Telephone Encounter (Signed)
Would you result strep and flu. I canceled order attached to my note and reordered apart so I could close. The strep was positive. Flu was negative.

## 2016-10-05 NOTE — Patient Instructions (Addendum)
Your strep test was positive and your left ear appears infected as well. Rx azithromycin since allergic to pcn. If either of these area perist or worsen let us know.  Some symptoms of bronchitis and azithromycin has good lung coverage as well.  For nasal congestion rx flonase.  For cough rx benzonatate.  If you cough worsens or chest congestion become predominant symptom then let us know and will get cxr  Follow up in 7 days or as needed

## 2016-10-06 LAB — POC INFLUENZA A&B (BINAX/QUICKVUE)
Influenza A, POC: NEGATIVE
Influenza B, POC: NEGATIVE

## 2016-10-06 NOTE — Addendum Note (Signed)
Addended by: Rockwell Germany on: 10/06/2016 12:26 PM   Modules accepted: Orders

## 2016-10-26 DIAGNOSIS — E1142 Type 2 diabetes mellitus with diabetic polyneuropathy: Secondary | ICD-10-CM | POA: Diagnosis not present

## 2016-10-26 DIAGNOSIS — R69 Illness, unspecified: Secondary | ICD-10-CM | POA: Diagnosis not present

## 2016-10-26 DIAGNOSIS — G620 Drug-induced polyneuropathy: Secondary | ICD-10-CM | POA: Diagnosis not present

## 2016-10-26 DIAGNOSIS — G894 Chronic pain syndrome: Secondary | ICD-10-CM | POA: Diagnosis not present

## 2016-11-04 ENCOUNTER — Other Ambulatory Visit: Payer: Self-pay | Admitting: Family Medicine

## 2016-11-04 ENCOUNTER — Other Ambulatory Visit: Payer: Self-pay | Admitting: *Deleted

## 2016-11-04 ENCOUNTER — Ambulatory Visit (HOSPITAL_BASED_OUTPATIENT_CLINIC_OR_DEPARTMENT_OTHER): Payer: Medicare HMO | Admitting: Family

## 2016-11-04 ENCOUNTER — Ambulatory Visit (HOSPITAL_BASED_OUTPATIENT_CLINIC_OR_DEPARTMENT_OTHER): Payer: Medicare HMO

## 2016-11-04 ENCOUNTER — Other Ambulatory Visit (HOSPITAL_BASED_OUTPATIENT_CLINIC_OR_DEPARTMENT_OTHER): Payer: Medicare HMO

## 2016-11-04 VITALS — BP 109/70 | HR 98 | Temp 98.8°F | Resp 18 | Wt 224.0 lb

## 2016-11-04 VITALS — BP 109/70 | HR 98 | Temp 98.8°F | Resp 18

## 2016-11-04 DIAGNOSIS — D508 Other iron deficiency anemias: Secondary | ICD-10-CM

## 2016-11-04 DIAGNOSIS — Z95828 Presence of other vascular implants and grafts: Secondary | ICD-10-CM

## 2016-11-04 DIAGNOSIS — G894 Chronic pain syndrome: Secondary | ICD-10-CM

## 2016-11-04 DIAGNOSIS — Z862 Personal history of diseases of the blood and blood-forming organs and certain disorders involving the immune mechanism: Secondary | ICD-10-CM | POA: Diagnosis not present

## 2016-11-04 DIAGNOSIS — Z85028 Personal history of other malignant neoplasm of stomach: Secondary | ICD-10-CM | POA: Diagnosis not present

## 2016-11-04 DIAGNOSIS — R1012 Left upper quadrant pain: Secondary | ICD-10-CM

## 2016-11-04 DIAGNOSIS — C162 Malignant neoplasm of body of stomach: Secondary | ICD-10-CM | POA: Diagnosis not present

## 2016-11-04 LAB — CBC WITH DIFFERENTIAL (CANCER CENTER ONLY)
BASO#: 0 10*3/uL (ref 0.0–0.2)
BASO%: 0.4 % (ref 0.0–2.0)
EOS%: 10.1 % — ABNORMAL HIGH (ref 0.0–7.0)
Eosinophils Absolute: 0.7 10*3/uL — ABNORMAL HIGH (ref 0.0–0.5)
HEMATOCRIT: 36.4 % (ref 34.8–46.6)
HGB: 12.2 g/dL (ref 11.6–15.9)
LYMPH#: 2.3 10*3/uL (ref 0.9–3.3)
LYMPH%: 32.5 % (ref 14.0–48.0)
MCH: 29.5 pg (ref 26.0–34.0)
MCHC: 33.5 g/dL (ref 32.0–36.0)
MCV: 88 fL (ref 81–101)
MONO#: 0.7 10*3/uL (ref 0.1–0.9)
MONO%: 10.4 % (ref 0.0–13.0)
NEUT#: 3.2 10*3/uL (ref 1.5–6.5)
NEUT%: 46.6 % (ref 39.6–80.0)
Platelets: 302 10*3/uL (ref 145–400)
RBC: 4.13 10*6/uL (ref 3.70–5.32)
RDW: 13.4 % (ref 11.1–15.7)
WBC: 7 10*3/uL (ref 3.9–10.0)

## 2016-11-04 LAB — COMPREHENSIVE METABOLIC PANEL
ALBUMIN: 3.4 g/dL — AB (ref 3.5–5.0)
ALK PHOS: 114 U/L (ref 40–150)
ALT: 15 U/L (ref 0–55)
ANION GAP: 9 meq/L (ref 3–11)
AST: 16 U/L (ref 5–34)
BILIRUBIN TOTAL: 0.38 mg/dL (ref 0.20–1.20)
BUN: 18.7 mg/dL (ref 7.0–26.0)
CALCIUM: 9.3 mg/dL (ref 8.4–10.4)
CHLORIDE: 106 meq/L (ref 98–109)
CO2: 25 mEq/L (ref 22–29)
CREATININE: 1.2 mg/dL — AB (ref 0.6–1.1)
EGFR: 53 mL/min/{1.73_m2} — ABNORMAL LOW (ref >90)
Glucose: 192 mg/dl — ABNORMAL HIGH (ref 70–140)
Potassium: 4.8 mEq/L (ref 3.5–5.1)
Sodium: 139 mEq/L (ref 136–145)
TOTAL PROTEIN: 7.3 g/dL (ref 6.4–8.3)

## 2016-11-04 MED ORDER — SODIUM CHLORIDE 0.9% FLUSH
10.0000 mL | INTRAVENOUS | Status: DC | PRN
  Administered 2016-11-04: 10 mL via INTRAVENOUS
  Filled 2016-11-04: qty 10

## 2016-11-04 MED ORDER — HEPARIN SOD (PORK) LOCK FLUSH 100 UNIT/ML IV SOLN
500.0000 [IU] | Freq: Once | INTRAVENOUS | Status: AC
  Administered 2016-11-04: 500 [IU] via INTRAVENOUS
  Filled 2016-11-04: qty 5

## 2016-11-04 MED ORDER — TRAMADOL HCL 50 MG PO TABS: 50.0000 mg | ORAL_TABLET | Freq: Four times a day (QID) | ORAL | 0 refills | Status: DC

## 2016-11-04 NOTE — Progress Notes (Signed)
Hematology and Oncology Follow Up Visit  [name] [id number] June 29, 1951 66 y.o. 11/04/2016   Principle Diagnosis:  Stage IB (T1, N1, M0) adenocarcinoma of the stomach Iron deficiency anemia  Current Therapy:   Observation IV iron as indicated - last received in December 2016    Interim History:  [name] is for a follow-up. She is doing well but still has the intermittent twinges or pain in bilateral upper quadrants. This comes and goes and has been a longstanding issue for her. CT scan last month showed no evidence recurrent or metastatic disease.  She has generalized aches and pains due to arthritis. She is seen by the local pain clinic and they are working on relieving the arthritic pain in her left shoulder and her neuropathy of the hands and feet. They recently started her on Embeda and she feels that this is helping.  No swelling or tenderness in her extremities. No c/o fatigue. No fever, chills, n/v, cough, rash, dizziness, vision changes, SOB, chest pain, palpitations or changes in bowel or bladder habits. She has worked hard to lose weight and is eating healthy. She has a good appetite and is staying well hydrated. Her weight is stable.   ECOG Performance Status: 0 - Asymptomatic  Medications:  Allergies as of 11/04/2016      Reactions   Adhesive [tape] Other (See Comments)   Burn Skin   Codeine Other (See Comments)   paranoid   Zocor [simvastatin - High Dose] Other (See Comments)   Muscle spasms   Penicillins Rash   Has patient had a PCN reaction causing immediate rash, facial/tongue/throat swelling, SOB or lightheadedness with hypotension: Yes Has patient had a PCN reaction causing severe rash involving mucus membranes or skin necrosis: No Has patient had a PCN reaction that required hospitalization does not remember.  Has patient had a PCN reaction occurring within the last 10 years: No If all of the above answers are "NO", then may proceed with Cephalosporin use.        Medication List       Accurate as of 11/04/16  8:49 AM. Always use your most recent med list.          aspirin 81 MG tablet Take 81 mg by mouth at bedtime.   azithromycin 250 MG tablet Commonly known as:  ZITHROMAX Take 2 tablets by mouth on day 1, followed by 1 tablet by mouth daily for 4 days.   B-D ASSURE BPM/MANUAL ARM CUFF Misc Check blood pressure daily.   benzonatate 100 MG capsule Commonly known as:  TESSALON Take 1 capsule (100 mg total) by mouth 3 (three) times daily as needed for cough.   DULoxetine 60 MG capsule Commonly known as:  CYMBALTA Take 1 capsule (60 mg total) by mouth 2 (two) times daily.   EMBEDA 20-0.8 MG Cpcr Generic drug:  Morphine-Naltrexone TK 1 C PO QD   fluticasone 50 MCG/ACT nasal spray Commonly known as:  FLONASE Place 2 sprays into both nostrils daily.   gabapentin 400 MG capsule Commonly known as:  NEURONTIN Take 2 capsules (800 mg total) by mouth 3 (three) times daily.   glimepiride 4 MG tablet Commonly known as:  AMARYL Take 1 tablet (4 mg total) by mouth 2 (two) times daily.   multivitamin with minerals Tabs tablet Take 1 tablet by mouth daily.   ONE TOUCH ULTRA 2 w/Device Kit   ONE TOUCH ULTRA TEST test strip Generic drug:  glucose blood   OT ULTRA/FASTTK CNTRL  SOLN Soln   traMADol 50 MG tablet Commonly known as:  ULTRAM Take 1 tablet (50 mg total) by mouth 4 (four) times daily.       Allergies:  Allergies  Allergen Reactions  . Adhesive [Tape] Other (See Comments)    Burn Skin  . Codeine Other (See Comments)    paranoid  . Zocor [Simvastatin - High Dose] Other (See Comments)    Muscle spasms  . Penicillins Rash    Has patient had a PCN reaction causing immediate rash, facial/tongue/throat swelling, SOB or lightheadedness with hypotension: Yes Has patient had a PCN reaction causing severe rash involving mucus membranes or skin necrosis: No Has patient had a PCN reaction that required hospitalization  does not remember.  Has patient had a PCN reaction occurring within the last 10 years: No If all of the above answers are "NO", then may proceed with Cephalosporin use.     Past Medical History, Surgical history, Social history, and Family History were reviewed and updated.  Review of Systems: All other 10 point review of systems is negative.   Physical Exam:  vitals were not taken for this visit.  Wt Readings from Last 3 Encounters:  10/05/16 216 lb 8 oz (98.2 kg)  09/13/16 210 lb (95.3 kg)  08/02/16 208 lb (94.3 kg)    Ocular: Sclerae unicteric, pupils equal, round and reactive to light Ear-nose-throat: Oropharynx clear, dentition fair Lymphatic: No cervical, supraclavicular or axillary adenopathy Lungs no rales or rhonchi, good excursion bilaterally Heart regular rate and rhythm, no murmur appreciated Abd soft, nontender, positive bowel sounds, no liver or spleen tip palpated on exam, no fluid wave MSK no focal spinal tenderness, no joint edema Neuro: non-focal, well-oriented, appropriate affect Breasts: Deferred  Lab Results  Component Value Date   WBC 7.0 11/04/2016   HGB 12.2 11/04/2016   HCT 36.4 11/04/2016   MCV 88 11/04/2016   PLT 302 11/04/2016   Lab Results  Component Value Date   FERRITIN 422 (H) 08/02/2016   IRON 64 08/02/2016   TIBC 260 08/02/2016   UIBC 196 08/02/2016   IRONPCTSAT 25 08/02/2016   Lab Results  Component Value Date   RETICCTPCT 1.5 04/28/2014   RBC 4.13 11/04/2016   RETICCTABS 59.9 04/28/2014   No results found for: KPAFRELGTCHN, LAMBDASER, KAPLAMBRATIO No results found for: IGGSERUM, IGA, IGMSERUM No results found for: Odetta Pink, SPEI   Chemistry      Component Value Date/Time   NA 138 09/13/2016 0959   K 4.7 09/13/2016 0959   CL 101 08/02/2016 1351   CO2 20 (L) 09/13/2016 0959   BUN 26.3 (H) 09/13/2016 0959   CREATININE 1.3 (H) 09/13/2016 0959      Component Value  Date/Time   CALCIUM 9.3 09/13/2016 0959   ALKPHOS 103 09/13/2016 0959   AST 15 09/13/2016 0959   ALT 14 09/13/2016 0959   BILITOT 0.22 09/13/2016 0959     Impression and Plan: [name] is a 66 yo African [place] female with multiple health issues including history of stage IB stomach cancer. She underwent resection and completed adjuvant radiation and chemotherapy over 8 years ago. So far, she has done well and there has been no evidence of recurrence. She has had no new issues and continues to do well.  She sees pain management regularly and recently started treatment with Embeda for arthritis and neuropathic pain.  CMP is stable. CMP pending.  We will go ahead and plan  to see her back in 4 months for repeat lab work and follow-up.  I spent 15 minutes face to face counseling the patient.  She will contact us with any questions or concerns. We can certainly see her sooner if need be.   Eliezer Bottom, NP 4/13/20188:49 AM

## 2016-11-07 ENCOUNTER — Other Ambulatory Visit: Payer: Self-pay | Admitting: Family

## 2016-11-10 ENCOUNTER — Other Ambulatory Visit: Payer: Self-pay | Admitting: *Deleted

## 2016-11-10 DIAGNOSIS — G5793 Unspecified mononeuropathy of bilateral lower limbs: Secondary | ICD-10-CM

## 2016-11-10 DIAGNOSIS — G894 Chronic pain syndrome: Secondary | ICD-10-CM

## 2016-11-10 MED ORDER — TRAMADOL HCL 50 MG PO TABS: 50.0000 mg | ORAL_TABLET | Freq: Four times a day (QID) | ORAL | 0 refills | Status: DC

## 2016-11-10 MED ORDER — GABAPENTIN 400 MG PO CAPS: 800.0000 mg | ORAL_CAPSULE | Freq: Three times a day (TID) | ORAL | 2 refills | Status: DC

## 2016-11-11 ENCOUNTER — Ambulatory Visit: Payer: Self-pay | Admitting: Family

## 2016-11-11 ENCOUNTER — Other Ambulatory Visit: Payer: Self-pay

## 2016-11-15 ENCOUNTER — Telehealth: Payer: Self-pay

## 2016-11-15 DIAGNOSIS — G894 Chronic pain syndrome: Secondary | ICD-10-CM

## 2016-11-15 DIAGNOSIS — E1165 Type 2 diabetes mellitus with hyperglycemia: Secondary | ICD-10-CM

## 2016-11-15 DIAGNOSIS — IMO0002 Reserved for concepts with insufficient information to code with codable children: Secondary | ICD-10-CM

## 2016-11-15 DIAGNOSIS — E1151 Type 2 diabetes mellitus with diabetic peripheral angiopathy without gangrene: Secondary | ICD-10-CM

## 2016-11-15 DIAGNOSIS — G5793 Unspecified mononeuropathy of bilateral lower limbs: Secondary | ICD-10-CM

## 2016-11-15 MED ORDER — GABAPENTIN 400 MG PO CAPS: 800.0000 mg | ORAL_CAPSULE | Freq: Three times a day (TID) | ORAL | 8 refills | Status: DC

## 2016-11-15 MED ORDER — TRAMADOL HCL 50 MG PO TABS: 50.0000 mg | ORAL_TABLET | Freq: Four times a day (QID) | ORAL | 1 refills | Status: DC

## 2016-11-15 MED ORDER — DULOXETINE HCL 60 MG PO CPEP: 60.0000 mg | ORAL_CAPSULE | Freq: Two times a day (BID) | ORAL | 4 refills | Status: DC

## 2016-11-15 MED ORDER — GLIMEPIRIDE 4 MG PO TABS: 4.0000 mg | ORAL_TABLET | Freq: Two times a day (BID) | ORAL | 5 refills | Status: DC

## 2016-11-15 NOTE — Telephone Encounter (Signed)
Rx sent to pharmacy., per provider request.  LB

## 2016-11-25 DIAGNOSIS — G894 Chronic pain syndrome: Secondary | ICD-10-CM | POA: Diagnosis not present

## 2016-11-25 DIAGNOSIS — R69 Illness, unspecified: Secondary | ICD-10-CM | POA: Diagnosis not present

## 2016-11-25 DIAGNOSIS — E1142 Type 2 diabetes mellitus with diabetic polyneuropathy: Secondary | ICD-10-CM | POA: Diagnosis not present

## 2016-11-25 DIAGNOSIS — G620 Drug-induced polyneuropathy: Secondary | ICD-10-CM | POA: Diagnosis not present

## 2016-12-05 DIAGNOSIS — Z7984 Long term (current) use of oral hypoglycemic drugs: Secondary | ICD-10-CM | POA: Diagnosis not present

## 2016-12-05 DIAGNOSIS — Z79899 Other long term (current) drug therapy: Secondary | ICD-10-CM | POA: Diagnosis not present

## 2016-12-05 DIAGNOSIS — Z0000 Encounter for general adult medical examination without abnormal findings: Secondary | ICD-10-CM | POA: Diagnosis not present

## 2016-12-05 DIAGNOSIS — Z87891 Personal history of nicotine dependence: Secondary | ICD-10-CM | POA: Diagnosis not present

## 2016-12-05 DIAGNOSIS — R3915 Urgency of urination: Secondary | ICD-10-CM | POA: Diagnosis not present

## 2016-12-05 DIAGNOSIS — C169 Malignant neoplasm of stomach, unspecified: Secondary | ICD-10-CM | POA: Diagnosis not present

## 2016-12-05 DIAGNOSIS — R69 Illness, unspecified: Secondary | ICD-10-CM | POA: Diagnosis not present

## 2016-12-05 DIAGNOSIS — M7592 Shoulder lesion, unspecified, left shoulder: Secondary | ICD-10-CM | POA: Diagnosis not present

## 2016-12-05 DIAGNOSIS — K08409 Partial loss of teeth, unspecified cause, unspecified class: Secondary | ICD-10-CM | POA: Diagnosis not present

## 2016-12-05 DIAGNOSIS — Z79891 Long term (current) use of opiate analgesic: Secondary | ICD-10-CM | POA: Diagnosis not present

## 2016-12-05 DIAGNOSIS — E1142 Type 2 diabetes mellitus with diabetic polyneuropathy: Secondary | ICD-10-CM | POA: Diagnosis not present

## 2016-12-05 DIAGNOSIS — K219 Gastro-esophageal reflux disease without esophagitis: Secondary | ICD-10-CM | POA: Diagnosis not present

## 2016-12-05 DIAGNOSIS — R42 Dizziness and giddiness: Secondary | ICD-10-CM | POA: Diagnosis not present

## 2016-12-08 ENCOUNTER — Telehealth: Payer: Self-pay | Admitting: Family Medicine

## 2016-12-08 MED ORDER — ONDANSETRON 4 MG PO TBDP: 4.0000 mg | ORAL_TABLET | Freq: Three times a day (TID) | ORAL | 0 refills | Status: DC | PRN

## 2016-12-08 NOTE — Addendum Note (Signed)
Addended by: Sharon Seller B on: 12/08/2016 02:25 PM   Modules accepted: Orders

## 2016-12-08 NOTE — Telephone Encounter (Signed)
Ok to fill ondansetron--has not been filled since 2017

## 2016-12-08 NOTE — Telephone Encounter (Signed)
Called to inform this patient that we sent her last refill in on 11/15/2016 to [facility] for #120 with 1 refill and that she has one refill left on that prescription. Patient agreed to call [facility] to confirm

## 2016-12-08 NOTE — Telephone Encounter (Signed)
Sent in

## 2016-12-08 NOTE — Telephone Encounter (Signed)
Yes x 1 

## 2016-12-08 NOTE — Addendum Note (Signed)
Addended by: Sharon Seller B on: 12/08/2016 01:54 PM   Modules accepted: Orders

## 2016-12-08 NOTE — Telephone Encounter (Signed)
Relation to pt: self  Call back number:[phone] Pharmacy: [facility], [place] [phone] (Phone) [phone] (Fax)     Reason for call:  Patient requesting a refill traMADol (ULTRAM) 50 MG tablet

## 2016-12-08 NOTE — Addendum Note (Signed)
Addended by: Sharon Seller B on: 12/08/2016 02:23 PM   Modules accepted: Orders

## 2016-12-13 IMAGING — DX DG CHEST 2V
2 series · 2 of 2 positions shown · non-contrast
Comparison: May 16, 2015

CLINICAL DATA: Shortness of breath with cough and wheezing for 1
day. Low-grade fever.

EXAM:
CHEST  2 VIEW

[chest pa]
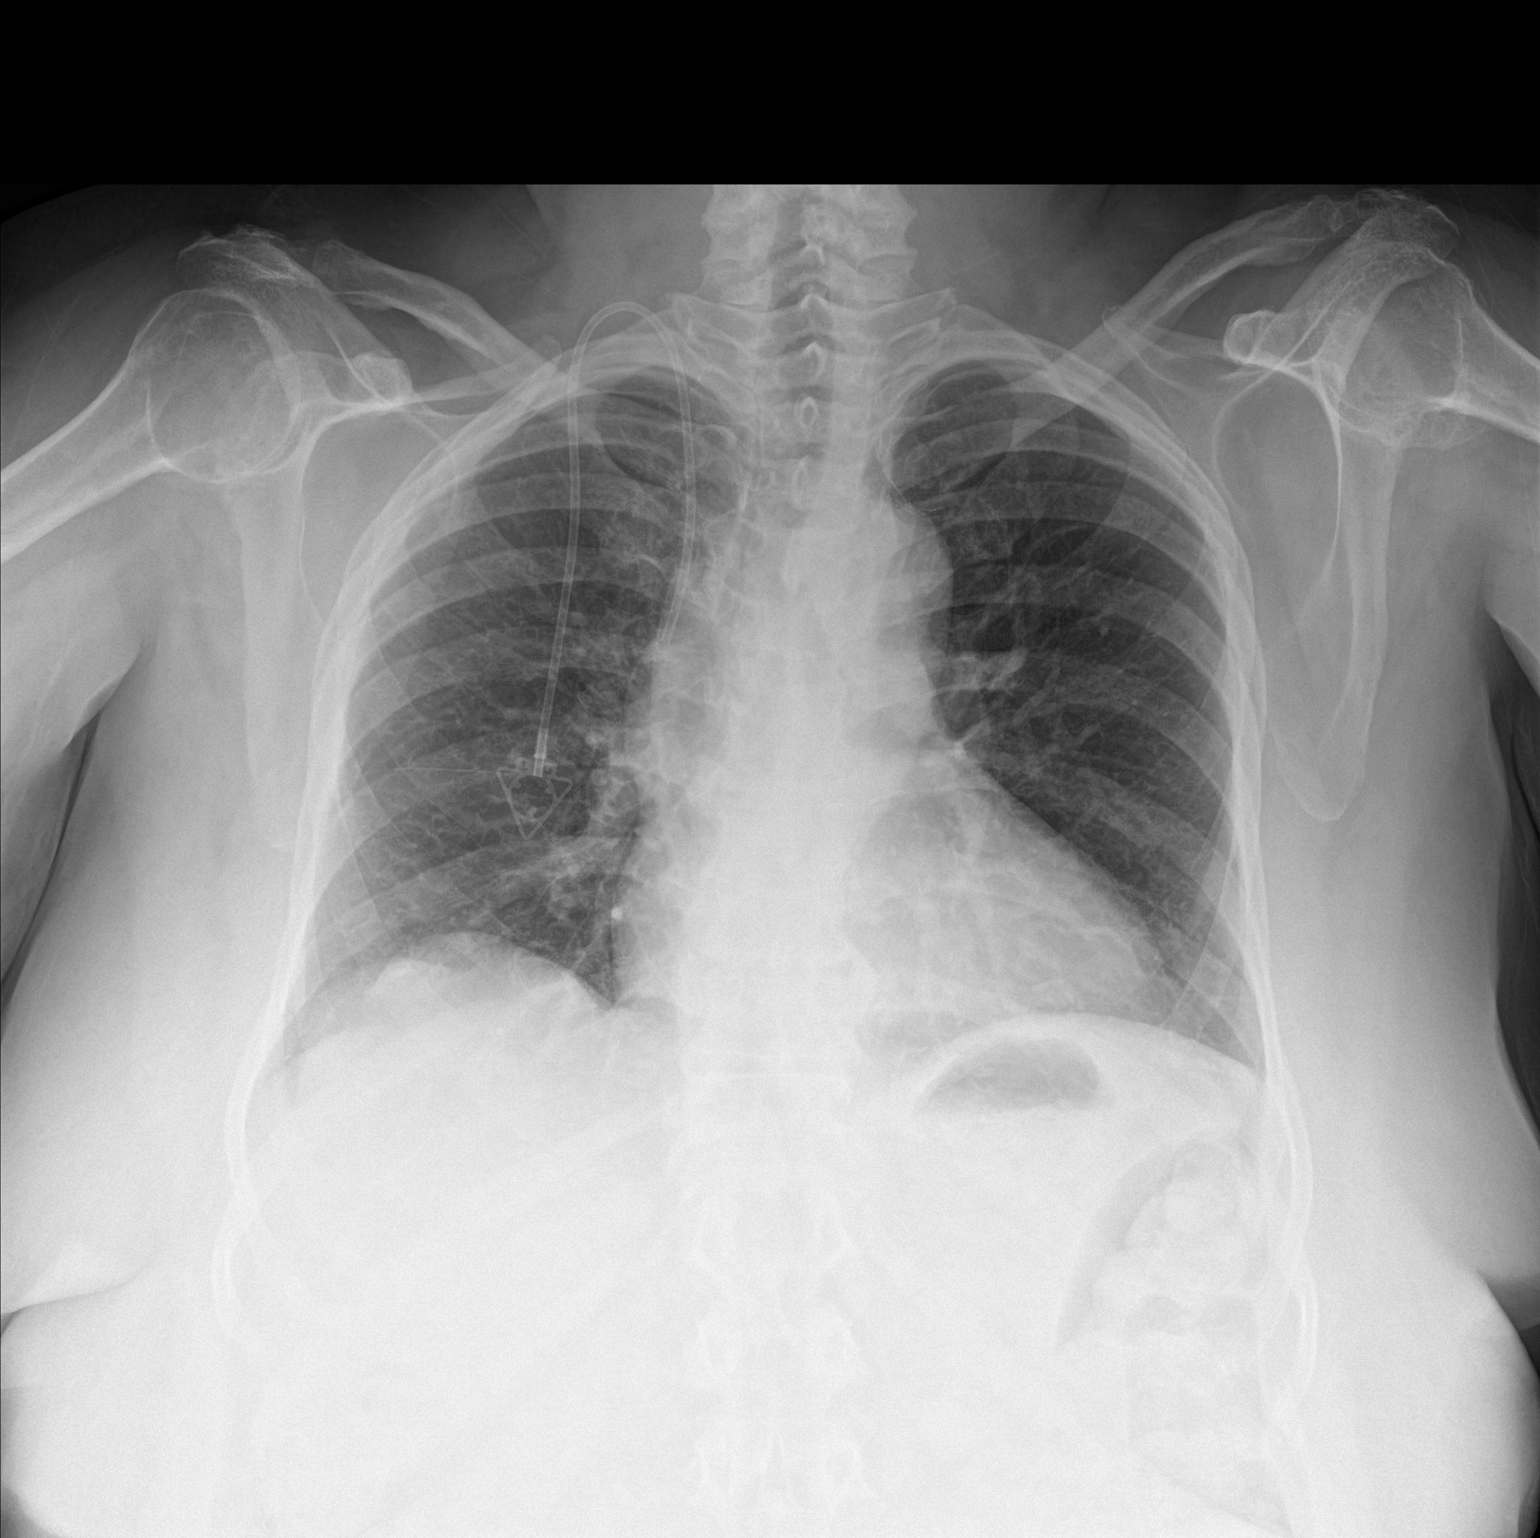

[chest lat]
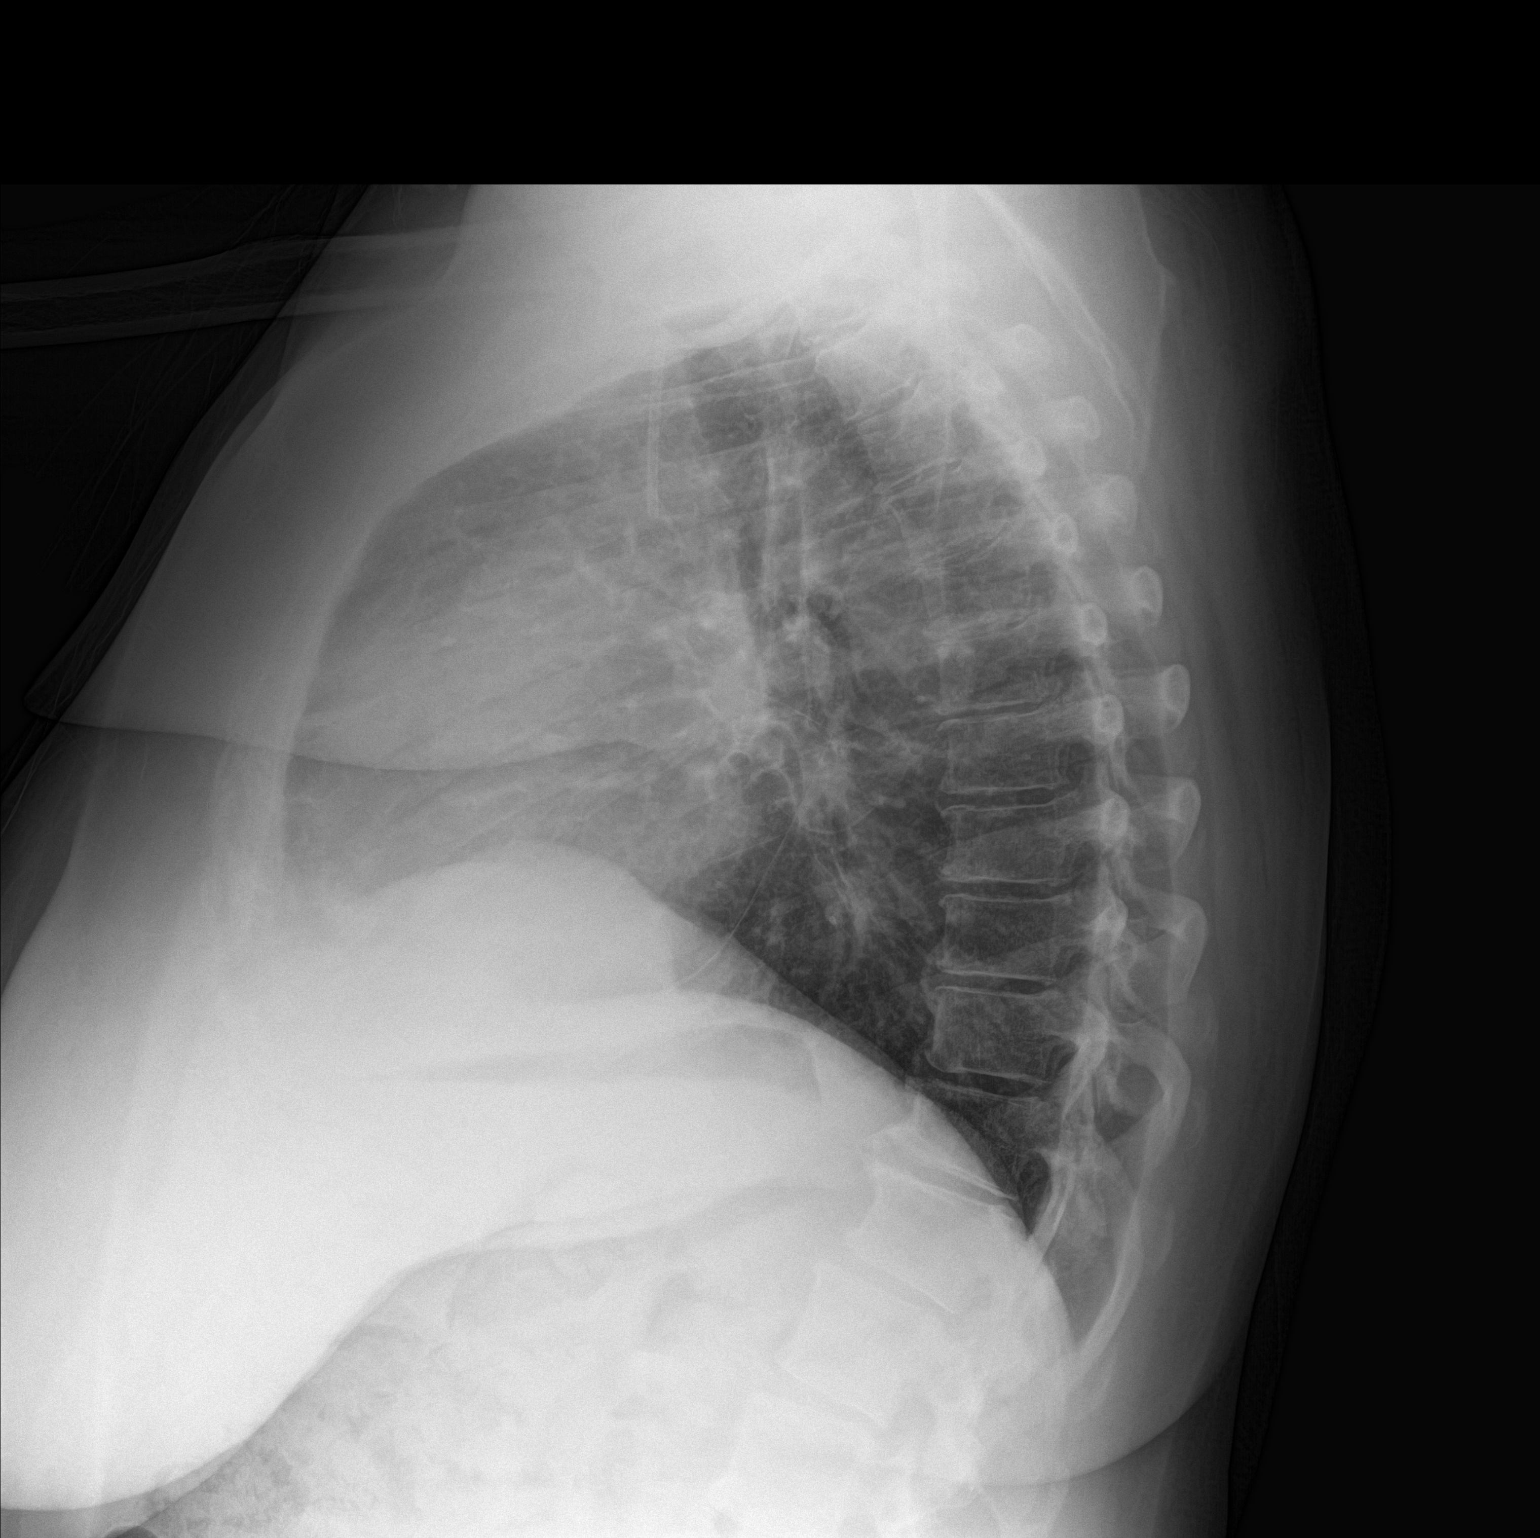

[2 of 2 positions shown; findings below may reference images not displayed]

FINDINGS: There is no edema or consolidation. Heart size and pulmonary
vascularity are normal. No adenopathy. Port-A-Cath tip is in the
superior vena cava. No pneumothorax. There is degenerative change in
each shoulder and in the thoracic spine.
IMPRESSION: No edema or consolidation.

## 2016-12-17 ENCOUNTER — Emergency Department (HOSPITAL_COMMUNITY): Payer: Medicare HMO

## 2016-12-17 ENCOUNTER — Other Ambulatory Visit: Payer: Self-pay

## 2016-12-17 ENCOUNTER — Encounter (HOSPITAL_COMMUNITY): Payer: Self-pay | Admitting: Emergency Medicine

## 2016-12-17 ENCOUNTER — Emergency Department (HOSPITAL_COMMUNITY)
Admission: EM | Admit: 2016-12-17 | Discharge: 2016-12-17 | Disposition: A | Discharge status: Home / Self Care | Payer: Medicare HMO | Attending: Emergency Medicine | Admitting: Emergency Medicine

## 2016-12-17 DIAGNOSIS — K2100 Gastro-esophageal reflux disease with esophagitis, without bleeding: Secondary | ICD-10-CM

## 2016-12-17 DIAGNOSIS — R1013 Epigastric pain: Secondary | ICD-10-CM | POA: Insufficient documentation

## 2016-12-17 DIAGNOSIS — R079 Chest pain, unspecified: Secondary | ICD-10-CM | POA: Diagnosis not present

## 2016-12-17 DIAGNOSIS — Z79899 Other long term (current) drug therapy: Secondary | ICD-10-CM | POA: Diagnosis not present

## 2016-12-17 DIAGNOSIS — I129 Hypertensive chronic kidney disease with stage 1 through stage 4 chronic kidney disease, or unspecified chronic kidney disease: Secondary | ICD-10-CM | POA: Insufficient documentation

## 2016-12-17 DIAGNOSIS — Z7982 Long term (current) use of aspirin: Secondary | ICD-10-CM | POA: Insufficient documentation

## 2016-12-17 DIAGNOSIS — R0789 Other chest pain: Secondary | ICD-10-CM | POA: Insufficient documentation

## 2016-12-17 DIAGNOSIS — Z8673 Personal history of transient ischemic attack (TIA), and cerebral infarction without residual deficits: Secondary | ICD-10-CM | POA: Insufficient documentation

## 2016-12-17 DIAGNOSIS — Z87891 Personal history of nicotine dependence: Secondary | ICD-10-CM | POA: Insufficient documentation

## 2016-12-17 DIAGNOSIS — N183 Chronic kidney disease, stage 3 (moderate): Secondary | ICD-10-CM | POA: Diagnosis not present

## 2016-12-17 DIAGNOSIS — J45909 Unspecified asthma, uncomplicated: Secondary | ICD-10-CM | POA: Diagnosis not present

## 2016-12-17 DIAGNOSIS — K449 Diaphragmatic hernia without obstruction or gangrene: Secondary | ICD-10-CM | POA: Diagnosis not present

## 2016-12-17 DIAGNOSIS — E1122 Type 2 diabetes mellitus with diabetic chronic kidney disease: Secondary | ICD-10-CM | POA: Insufficient documentation

## 2016-12-17 LAB — I-STAT CHEM 8, ED
BUN: 15 mg/dL (ref 6–20)
CALCIUM ION: 1.14 mmol/L — AB (ref 1.15–1.40)
Chloride: 104 mmol/L (ref 101–111)
Creatinine, Ser: 1.2 mg/dL — ABNORMAL HIGH (ref 0.44–1.00)
GLUCOSE: 126 mg/dL — AB (ref 65–99)
HCT: 34 % — ABNORMAL LOW (ref 36.0–46.0)
Hemoglobin: 11.6 g/dL — ABNORMAL LOW (ref 12.0–15.0)
Potassium: 4.6 mmol/L (ref 3.5–5.1)
Sodium: 141 mmol/L (ref 135–145)
TCO2: 28 mmol/L (ref 0–100)

## 2016-12-17 LAB — CBC
HEMATOCRIT: 36.5 % (ref 36.0–46.0)
Hemoglobin: 12 g/dL (ref 12.0–15.0)
MCH: 28.6 pg (ref 26.0–34.0)
MCHC: 32.9 g/dL (ref 30.0–36.0)
MCV: 87.1 fL (ref 78.0–100.0)
PLATELETS: 305 10*3/uL (ref 150–400)
RBC: 4.19 MIL/uL (ref 3.87–5.11)
RDW: 14.3 % (ref 11.5–15.5)
WBC: 6.7 10*3/uL (ref 4.0–10.5)

## 2016-12-17 LAB — POCT I-STAT TROPONIN I: TROPONIN I, POC: 0 ng/mL (ref 0.00–0.08)

## 2016-12-17 LAB — HEPATIC FUNCTION PANEL
ALBUMIN: 3.3 g/dL — AB (ref 3.5–5.0)
ALK PHOS: 95 U/L (ref 38–126)
ALT: 20 U/L (ref 14–54)
AST: 26 U/L (ref 15–41)
BILIRUBIN DIRECT: 0.1 mg/dL (ref 0.1–0.5)
BILIRUBIN TOTAL: 0.3 mg/dL (ref 0.3–1.2)
Indirect Bilirubin: 0.2 mg/dL — ABNORMAL LOW (ref 0.3–0.9)
Total Protein: 7.1 g/dL (ref 6.5–8.1)

## 2016-12-17 LAB — I-STAT TROPONIN, ED: Troponin i, poc: 0 ng/mL (ref 0.00–0.08)

## 2016-12-17 LAB — BASIC METABOLIC PANEL
Anion gap: 7 (ref 5–15)
BUN: 15 mg/dL (ref 6–20)
CALCIUM: 8.8 mg/dL — AB (ref 8.9–10.3)
CO2: 28 mmol/L (ref 22–32)
CREATININE: 1.3 mg/dL — AB (ref 0.44–1.00)
Chloride: 105 mmol/L (ref 101–111)
GFR calc Af Amer: 49 mL/min — ABNORMAL LOW (ref >60)
GFR, EST NON AFRICAN AMERICAN: 42 mL/min — AB (ref >60)
GLUCOSE: 208 mg/dL — AB (ref 65–99)
POTASSIUM: 5.2 mmol/L — AB (ref 3.5–5.1)
SODIUM: 140 mmol/L (ref 135–145)

## 2016-12-17 LAB — LIPASE, BLOOD: LIPASE: 14 U/L (ref 11–51)

## 2016-12-17 MED ORDER — DICYCLOMINE HCL 10 MG PO CAPS
10.0000 mg | ORAL_CAPSULE | Freq: Once | ORAL | Status: AC
  Administered 2016-12-17: 10 mg via ORAL
  Filled 2016-12-17: qty 1

## 2016-12-17 MED ORDER — FENTANYL CITRATE (PF) 100 MCG/2ML IJ SOLN
50.0000 ug | Freq: Once | INTRAMUSCULAR | Status: AC
  Administered 2016-12-17: 50 ug via INTRAVENOUS
  Filled 2016-12-17: qty 2

## 2016-12-17 MED ORDER — MORPHINE SULFATE (PF) 2 MG/ML IV SOLN: 4.0000 mg | Freq: Once | INTRAVENOUS | Status: DC

## 2016-12-17 MED ORDER — DIPHENHYDRAMINE HCL 50 MG/ML IJ SOLN
25.0000 mg | Freq: Once | INTRAMUSCULAR | Status: AC
  Administered 2016-12-17: 25 mg via INTRAVENOUS
  Filled 2016-12-17: qty 1

## 2016-12-17 MED ORDER — IOPAMIDOL (ISOVUE-300) INJECTION 61%
INTRAVENOUS | Status: AC
  Administered 2016-12-17: 75 mL
  Filled 2016-12-17: qty 75

## 2016-12-17 MED ORDER — OMEPRAZOLE 20 MG PO CPDR
20.0000 mg | DELAYED_RELEASE_CAPSULE | Freq: Every day | ORAL | 0 refills | Status: DC
Start: 2016-12-17 — End: 2017-02-21

## 2016-12-17 MED ORDER — HYDROMORPHONE HCL 1 MG/ML IJ SOLN
1.0000 mg | Freq: Once | INTRAMUSCULAR | Status: AC
  Administered 2016-12-17: 1 mg via INTRAVENOUS
  Filled 2016-12-17: qty 1

## 2016-12-17 MED ORDER — ONDANSETRON HCL 4 MG/2ML IJ SOLN
4.0000 mg | Freq: Once | INTRAMUSCULAR | Status: AC
  Administered 2016-12-17: 4 mg via INTRAVENOUS
  Filled 2016-12-17: qty 2

## 2016-12-17 MED ORDER — METOCLOPRAMIDE HCL 5 MG/ML IJ SOLN
10.0000 mg | Freq: Once | INTRAMUSCULAR | Status: AC
  Administered 2016-12-17: 10 mg via INTRAVENOUS
  Filled 2016-12-17: qty 2

## 2016-12-17 MED ORDER — SUCRALFATE 1 GM/10ML PO SUSP: 1.0000 g | Freq: Three times a day (TID) | ORAL | 0 refills | Status: DC

## 2016-12-17 MED ORDER — GI COCKTAIL ~~LOC~~
30.0000 mL | Freq: Once | ORAL | Status: AC
  Administered 2016-12-17: 30 mL via ORAL
  Filled 2016-12-17: qty 30

## 2016-12-17 MED ORDER — HEPARIN SOD (PORK) LOCK FLUSH 100 UNIT/ML IV SOLN
500.0000 [IU] | Freq: Once | INTRAVENOUS | Status: AC
  Administered 2016-12-17: 500 [IU]
  Filled 2016-12-17: qty 5

## 2016-12-17 MED ORDER — SODIUM CHLORIDE 0.9 % IV BOLUS (SEPSIS)
1000.0000 mL | Freq: Once | INTRAVENOUS | Status: AC
  Administered 2016-12-17: 1000 mL via INTRAVENOUS

## 2016-12-17 MED ORDER — POLYETHYLENE GLYCOL 3350 17 G PO PACK: 17.0000 g | PACK | Freq: Two times a day (BID) | ORAL | 0 refills | Status: AC

## 2016-12-17 NOTE — ED Notes (Signed)
Pt ambulatory and independent at discharge.  Verbalized understanding of discharge instructions 

## 2016-12-17 NOTE — ED Provider Notes (Signed)
[facility] DEPT Provider Note   CSN: [id number] Arrival date & time: 12/17/16  1155     History   Chief Complaint Chief Complaint  Patient presents with  . Chest Pain    HPI [name] is a 66 y.o. female.  HPI   66 year old female with past medical history as below who presents with epigastric pain. The patient has an extensive history of previous chronic GI pain secondary to gastric cancer status post resection. She states that over the last several days, she has developed an aching, dull, burning epigastric pain that is worse when she lies flat. She has associated nausea but no vomiting. She also endorses associated constipation and has had painful bowel movements and decreased bowel movement frequency over the last several days. She denies any fevers. Denies any dysuria or frequency. She has no history of coronary disease and states her pain does feel similar to her previous episodes of stomach related pain.  Past Medical History:  Diagnosis Date  . Asthma   . Degenerative joint disease   . Diabetes mellitus   . Dyspnea   . GERD (gastroesophageal reflux disease)   . Hyperlipidemia    no longer on medication for this  . Hypertension    patient denies  . Neuropathy    bilateral hands and feet  . Sickle cell trait ([place])   . Sleep apnea   . stomach ca dx'd 2010   chemo/xrt comp 05/2009  . TIA (transient ischemic attack) 07/2015    Patient Active Problem List   Diagnosis Date Noted  . Fatty liver 03/24/2016  . Weakness 03/23/2016  . Unexplained weight loss 03/23/2016  . Melena 03/23/2016  . Hypotension 02/03/2016  . Syncope 02/03/2016  . Hypomagnesemia 02/03/2016  . AKI (acute kidney injury) ([place]) 02/03/2016  . Gastrointestinal dysmotility 01/20/2016  . CKD (chronic kidney disease), stage III 01/20/2016  . Abscess of right thigh 01/20/2016  . Chronic pain syndrome 01/15/2016  . Diarrhea 01/15/2016  . Generalized abdominal pain 01/14/2016  . Uncontrolled  type 2 diabetes mellitus with diabetic polyneuropathy, without long-term current use of insulin ([place]) 12/30/2015  . Morbid obesity due to excess calories ([place]) 05/05/2015  . Diabetic polyneuropathy associated with type 2 diabetes mellitus ([place]) 01/02/2015  . Acute bacterial sinusitis 01/02/2015  . Onychomycosis 11/04/2014  . Pain in lower limb 11/04/2014  . Multinodular goiter (nontoxic) 10/16/2014  . CVA (cerebral infarction) 08/11/2014  . Iron deficiency anemia 05/20/2014  . Major depressive disorder, single episode, moderate ([place]) 05/20/2014  . Dizziness of unknown cause 04/28/2014  . MDD (major depressive disorder), recurrent episode, moderate ([place]) 01/28/2014  . GERD (gastroesophageal reflux disease) 01/02/2014  . Nausea 10/15/2012  . Stomach cancer ([place]) 12/10/2008  . CHRONIC RESPIRATORY FAILURE 01/03/2008  . Hyperlipidemia 06/27/2007  . OBESITY, MORBID 06/27/2007  . SLEEP APNEA, OBSTRUCTIVE 06/27/2007  . Essential hypertension 06/27/2007  . Asthma 06/27/2007  . OSTEOARTHRITIS  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 06/27/2007    Past Surgical History:  Procedure Laterality Date  . APPENDECTOMY    . CESAREAN SECTION    . COLON SURGERY     colonscopy  . COLONOSCOPY WITH PROPOFOL N/A 06/01/2016   Procedure: COLONOSCOPY WITH PROPOFOL;  Surgeon: Wilford Corner, MD;  Location: [facility] ENDOSCOPY;  Service: Endoscopy;  Laterality: N/A;  . ESOPHAGOGASTRODUODENOSCOPY N/A 10/15/2012   Procedure: ESOPHAGOGASTRODUODENOSCOPY (EGD);  Surgeon: Lear Ng, MD;  Location: [facility] ENDOSCOPY;  Service: Endoscopy;  Laterality: N/A;  . ESOPHAGOGASTRODUODENOSCOPY N/A 01/15/2016  Procedure: ESOPHAGOGASTRODUODENOSCOPY (EGD);  Surgeon: Ronald Lobo, MD;  Location: [facility] ENDOSCOPY;  Service: Endoscopy;  Laterality: N/A;  . HERNIA REPAIR    . SHOULDER SURGERY     Left  . TOTAL HIP ARTHROPLASTY     Right  . TOTAL KNEE ARTHROPLASTY     Left    OB History    Gravida Para Term Preterm AB Living   _0 SAB TAB Ectopic Multiple Live Births                   Home Medications    Prior to Admission medications   Medication Sig Start Date End Date Taking? Authorizing Provider  aspirin 81 MG tablet Take 81 mg by mouth at bedtime.   Yes [provider]  Blood Glucose Calibration (OT ULTRA/FASTTK CNTRL SOLN) SOLN  02/17/16  Yes [provider]  Blood Glucose Monitoring Suppl (ONE TOUCH ULTRA 2) w/Device KIT  02/17/16  Yes [provider]  Blood Pressure Monitoring (B-D ASSURE BPM/MANUAL ARM CUFF) MISC Check blood pressure daily. 02/18/16  Yes Roma Schanz R, DO  DULoxetine (CYMBALTA) 60 MG capsule Take 1 capsule (60 mg total) by mouth 2 (two) times daily. 11/15/16  Yes Lowne Chase, Yvonne R, DO  EMBEDA 30-1.2 MG CPCR Take 1 tablet by mouth 2 (two) times daily.  10/28/16  Yes [provider]  gabapentin (NEURONTIN) 400 MG capsule Take 2 capsules (800 mg total) by mouth 3 (three) times daily. 11/15/16  Yes Ann Held, DO  Multiple Vitamin (MULTIVITAMIN WITH MINERALS) TABS tablet Take 1 tablet by mouth daily.   Yes [provider]  ONE TOUCH ULTRA TEST test strip  02/17/16  Yes [provider]  traMADol (ULTRAM) 50 MG tablet Take 1 tablet (50 mg total) by mouth 4 (four) times daily. 11/15/16  Yes Roma Schanz R, DO  fluticasone (FLONASE) 50 MCG/ACT nasal spray Place 2 sprays into both nostrils daily. Patient not taking: Reported on 12/17/2016 10/05/16   Saguier, Percell Miller, PA-C  glimepiride (AMARYL) 4 MG tablet Take 1 tablet (4 mg total) by mouth 2 (two) times daily. Patient not taking: Reported on 12/17/2016 11/15/16   Carollee Herter, Alferd Apa, DO  omeprazole (PRILOSEC) 20 MG capsule Take 1 capsule (20 mg total) by mouth daily. 12/17/16   Duffy Bruce, MD  ondansetron (ZOFRAN ODT) 4 MG disintegrating tablet Take 1 tablet (4 mg total) by mouth every 8 (eight) hours as needed for nausea or vomiting. Patient not taking: Reported on  12/17/2016 12/08/16   Carollee Herter, Kendrick Fries R, DO  polyethylene glycol [facility] / GLYCOLAX) packet Take 17 g by mouth 2 (two) times daily. 12/17/16 12/20/16  Duffy Bruce, MD  sucralfate (CARAFATE) 1 GM/10ML suspension Take 10 mLs (1 g total) by mouth 4 (four) times daily -  with meals and at bedtime. 12/17/16   Duffy Bruce, MD    Family History Family History  Problem Relation Age of Onset  . Diabetes Brother   . Alzheimer's disease Father 100  . Cancer Mother 38  . Diabetes Maternal Grandmother   . Cancer Maternal Grandfather        lung  . Suicidality Neg Hx   . Depression Neg Hx   . Dementia Neg Hx   . Anxiety disorder Neg Hx     Social History Social History  Substance Use Topics  . Smoking status: Former Smoker    Packs/day: 1.00  Years: 15.00    Types: Cigarettes    Start date: 09/27/1973    Quit date: 10/15/1988  . Smokeless tobacco: Never Used     Comment: quit smoking 14 years ago  . Alcohol use No     Allergies   Adhesive [tape]; Codeine; Zocor [simvastatin - high dose]; and Penicillins   Review of Systems Review of Systems  Constitutional: Positive for fatigue.  Respiratory: Positive for chest tightness.   Gastrointestinal: Positive for abdominal pain and nausea.  All other systems reviewed and are negative.    Physical Exam Updated Vital Signs BP 118/76 (BP Location: Right Arm)   Pulse 80   Temp 98.6 F (37 C)   Resp 16   Ht 5' 7" (1.702 m)   Wt 97.5 kg (215 lb)   SpO2 100%   BMI 33.67 kg/m   Physical Exam  Constitutional: She is oriented to person, place, and time. She appears well-developed and well-nourished. No distress.  HENT:  Head: Normocephalic and atraumatic.  Eyes: Conjunctivae are normal.  Neck: Neck supple.  Cardiovascular: Normal rate, regular rhythm and normal heart sounds.  Exam reveals no friction rub.   No murmur heard. Pulmonary/Chest: Effort normal and breath sounds normal. No respiratory distress. She has no wheezes.  She has no rales.  Abdominal: Soft. Bowel sounds are normal. She exhibits no distension. There is tenderness (moderate, epigastric). There is no rebound and no guarding.  Musculoskeletal: She exhibits no edema.  Neurological: She is alert and oriented to person, place, and time. She exhibits normal muscle tone.  Skin: Skin is warm. Capillary refill takes less than 2 seconds.  Psychiatric: She has a normal mood and affect.  Nursing note and vitals reviewed.    ED Treatments / Results  Labs (all labs ordered are listed, but only abnormal results are displayed) Labs Reviewed  BASIC METABOLIC PANEL - Abnormal; Notable for the following:       Result Value   Potassium 5.2 (*)    Glucose, Bld 208 (*)    Creatinine, Ser 1.30 (*)    Calcium 8.8 (*)    GFR calc non Af Amer 42 (*)    GFR calc Af Amer 49 (*)    All other components within normal limits  HEPATIC FUNCTION PANEL - Abnormal; Notable for the following:    Albumin 3.3 (*)    Indirect Bilirubin 0.2 (*)    All other components within normal limits  I-STAT CHEM 8, ED - Abnormal; Notable for the following:    Creatinine, Ser 1.20 (*)    Glucose, Bld 126 (*)    Calcium, Ion 1.14 (*)    Hemoglobin 11.6 (*)    HCT 34.0 (*)    All other components within normal limits  CBC  LIPASE, BLOOD  I-STAT TROPOININ, ED  POCT I-STAT TROPONIN I  I-STAT TROPOININ, ED    EKG  EKG Interpretation  Date/Time:  Saturday Dec 17 2016 14:37:14 EDT Ventricular Rate:  75 PR Interval:    QRS Duration: 85 QT Interval:  370 QTC Calculation: 414 R Axis:     Text Interpretation:  Sinus rhythm Low voltage, precordial leads No significant change since last tracing Confirmed by Duffy Bruce [phone]) on 12/17/2016 3:17:52 PM       Radiology Dg Chest 2 View  Result Date: 12/17/2016 CLINICAL DATA:  Mid chest pain today, history diabetes mellitus, asthma, hypertension, sickle trait EXAM: CHEST  2 VIEW COMPARISON:  02/22/2016 FINDINGS: RIGHT jugular  Port-A-Cath with tip  projecting over SVC. Upper normal heart size. Tortuous aorta. Mediastinal contours and pulmonary vascularity normal. Lungs clear. No pleural effusion or pneumothorax. No acute osseous findings. IMPRESSION: No acute abnormalities. Electronically Signed   By: Lavonia Dana M.D.   On: 12/17/2016 12:46   Ct Abdomen Pelvis W Contrast  Result Date: 12/17/2016 CLINICAL DATA:  67 year old female with a history of chest tightness, nausea. Patient has a history of prior gastric cancer treated with surgery EXAM: CT ABDOMEN AND PELVIS WITH CONTRAST TECHNIQUE: Multidetector CT imaging of the abdomen and pelvis was performed using the standard protocol following bolus administration of intravenous contrast. CONTRAST:  <See Chart> ISOVUE-300 IOPAMIDOL (ISOVUE-300) INJECTION 61% COMPARISON:  09/24/2016, 03/22/2016 FINDINGS: Lower chest: No acute abnormality. Hiatal hernia Hepatobiliary: Unremarkable appearance of liver. Unremarkable gallbladder. Pancreas: Atrophic pancreas with no inflammatory changes or calcifications. Spleen: Unremarkable spleen Adrenals/Urinary Tract: Unremarkable bilateral adrenal glands. No hydronephrosis or nephrolithiasis. Unremarkable course the bilateral ureters. Unremarkable urinary bladder. Stomach/Bowel: Surgical changes of the stomach compatible with gastrojejunostomy and a distal antrectomy. Patent anastomosis at the gastrojejunostomy. Partial fecalization of small bowel loops within the mid abdomen and the left abdomen. No transition point identified. No dilated small bowel. No inflammatory changes. Surgical changes of appendectomy. Moderate stool burden with formed stool within the right colon and transverse colon. Colonic diverticula. No associated inflammatory changes. Vascular/Lymphatic: Scattered calcifications of the abdominal aorta. No aneurysm. Reproductive: Unremarkable appearance of pelvic organs. Other: Surgical changes along the midline abdomen. Soft tissue  thickening along the midline abdomen, likely postoperative changes. No abdominal wall hernia. Musculoskeletal: Surgical changes of right hip arthroplasty. No displaced fracture. Degenerative changes of the lumbar spine. IMPRESSION: No acute finding of the abdomen/pelvis CT. No evidence of bowel obstruction, however, there is partial fecalization of short-segment bowel loops in the central abdomen and left abdomen. This finding can be seen with slow transit of material, potentially related to chronic pain medication usage. Correlation with a history of constipation and the narcotic pain medicines may be useful. Surgical changes of prior partial gastrectomy and gastrojejunostomy with no evidence of associated inflammatory changes. Diverticular disease without evidence of acute diverticulitis. Pancreatic atrophy, typically seen with longstanding diabetes. Hiatal hernia Electronically Signed   By: Corrie Mckusick D.O.   On: 12/17/2016 14:58    Procedures Procedures (including critical care time)  Medications Ordered in ED Medications  gi cocktail (Maalox,Lidocaine,Donnatal) (30 mLs Oral Given 12/17/16 1314)  ondansetron (ZOFRAN) injection 4 mg (4 mg Intravenous Given 12/17/16 1409)  fentaNYL (SUBLIMAZE) injection 50 mcg (50 mcg Intravenous Given 12/17/16 1409)  iopamidol (ISOVUE-300) 61 % injection (75 mLs  Contrast Given 12/17/16 1422)  HYDROmorphone (DILAUDID) injection 1 mg (1 mg Intravenous Given 12/17/16 1502)  sodium chloride 0.9 % bolus 1,000 mL (0 mLs Intravenous Stopped 12/17/16 1630)  dicyclomine (BENTYL) capsule 10 mg (10 mg Oral Given 12/17/16 1530)  metoCLOPramide (REGLAN) injection 10 mg (10 mg Intravenous Given 12/17/16 1530)  diphenhydrAMINE (BENADRYL) injection 25 mg (25 mg Intravenous Given 12/17/16 1530)  heparin lock flush 100 unit/mL (500 Units Intracatheter Given 12/17/16 1630)     Initial Impression / Assessment and Plan / ED Course  I have reviewed the triage vital signs and the nursing  notes.  Pertinent labs & imaging results that were available during my care of the patient were reviewed by me and considered in my medical decision making (see chart for details).     66 yo F with h/o gastric CA, recurrent epigastric/GI pain here with nausea, burning epigastric and  chest pain. On arrival, VSS. Hx and exam is most c/w recurrent gastritis/GERD. EKG non-ischemic, CXR is clear, trop neg despite constant sx >12 hours, doubt ACS, PE, or dissection. Her CT scan is without acute abnormality beyond fecalization of bowel loops, which is likely contrbuting to her pain and sx of GERD/gastritis. She does admit to increased constipation. Labs o/w reassuring. Sx improved with GI cocktail in ED. Will treat with carafate, bowel regimen, and refer for outpt follow-up. Pt in agreement. Tolerating PO without difficulty.  Final Clinical Impressions(s) / ED Diagnoses   Final diagnoses:  Atypical chest pain  Epigastric abdominal pain  Gastroesophageal reflux disease with esophagitis    New Prescriptions Discharge Medication List as of 12/17/2016  4:09 PM    START taking these medications   Details  omeprazole (PRILOSEC) 20 MG capsule Take 1 capsule (20 mg total) by mouth daily., Starting Sat 12/17/2016, Print    polyethylene glycol (MIRALAX / GLYCOLAX) packet Take 17 g by mouth 2 (two) times daily., Starting Sat 12/17/2016, Until Tue 12/20/2016, Print    sucralfate (CARAFATE) 1 GM/10ML suspension Take 10 mLs (1 g total) by mouth 4 (four) times daily -  with meals and at bedtime., Starting Sat 12/17/2016, Print         Duffy Bruce, MD 12/17/16 2044

## 2016-12-17 NOTE — ED Notes (Signed)
Bed: WTR5 Expected date:  Expected time:  Means of arrival:  Comments: 

## 2016-12-17 NOTE — ED Triage Notes (Signed)
Patient c/o central chest tightness radiating to the throat with nausea x1 hour. Denies SOB, dizziness, weakness, and abdominal pain.

## 2016-12-17 NOTE — ED Notes (Addendum)
Patient prefers port to be accessed for blood work.

## 2016-12-17 NOTE — ED Triage Notes (Signed)
She is c/o central chest "pressure" which began about an hour p.t.a. With "throat spasms--then it moved into my chest". EKG performed at triage. She is in no distress.

## 2016-12-20 ENCOUNTER — Ambulatory Visit: Payer: Medicare HMO | Admitting: Family Medicine

## 2016-12-20 ENCOUNTER — Telehealth: Payer: Self-pay | Admitting: Family Medicine

## 2016-12-20 NOTE — Telephone Encounter (Signed)
No charge. 
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
## 2016-12-26 DIAGNOSIS — E1142 Type 2 diabetes mellitus with diabetic polyneuropathy: Secondary | ICD-10-CM | POA: Diagnosis not present

## 2016-12-26 DIAGNOSIS — G894 Chronic pain syndrome: Secondary | ICD-10-CM | POA: Diagnosis not present

## 2016-12-26 DIAGNOSIS — R69 Illness, unspecified: Secondary | ICD-10-CM | POA: Diagnosis not present

## 2016-12-26 DIAGNOSIS — G620 Drug-induced polyneuropathy: Secondary | ICD-10-CM | POA: Diagnosis not present

## 2016-12-27 ENCOUNTER — Ambulatory Visit (INDEPENDENT_AMBULATORY_CARE_PROVIDER_SITE_OTHER): Payer: Medicare HMO | Admitting: Family Medicine

## 2016-12-27 ENCOUNTER — Encounter: Payer: Self-pay | Admitting: Family Medicine

## 2016-12-27 VITALS — BP 120/70 | HR 94 | Temp 98.2°F | Resp 16 | Ht 67.0 in | Wt 225.0 lb

## 2016-12-27 DIAGNOSIS — E1151 Type 2 diabetes mellitus with diabetic peripheral angiopathy without gangrene: Secondary | ICD-10-CM

## 2016-12-27 DIAGNOSIS — E042 Nontoxic multinodular goiter: Secondary | ICD-10-CM | POA: Diagnosis not present

## 2016-12-27 DIAGNOSIS — IMO0002 Reserved for concepts with insufficient information to code with codable children: Secondary | ICD-10-CM

## 2016-12-27 DIAGNOSIS — I10 Essential (primary) hypertension: Secondary | ICD-10-CM | POA: Diagnosis not present

## 2016-12-27 DIAGNOSIS — E785 Hyperlipidemia, unspecified: Secondary | ICD-10-CM | POA: Diagnosis not present

## 2016-12-27 DIAGNOSIS — E1165 Type 2 diabetes mellitus with hyperglycemia: Secondary | ICD-10-CM

## 2016-12-27 LAB — COMPREHENSIVE METABOLIC PANEL
ALBUMIN: 3.5 g/dL (ref 3.5–5.2)
ALK PHOS: 84 U/L (ref 39–117)
ALT: 18 U/L (ref 0–35)
AST: 19 U/L (ref 0–37)
BUN: 20 mg/dL (ref 6–23)
CALCIUM: 9 mg/dL (ref 8.4–10.5)
CO2: 26 mEq/L (ref 19–32)
CREATININE: 1.41 mg/dL — AB (ref 0.40–1.20)
Chloride: 104 mEq/L (ref 96–112)
GFR: 48.17 mL/min — ABNORMAL LOW (ref >60.00)
Glucose, Bld: 353 mg/dL — ABNORMAL HIGH (ref 70–99)
POTASSIUM: 5.1 meq/L (ref 3.5–5.1)
SODIUM: 137 meq/L (ref 135–145)
Total Bilirubin: 0.3 mg/dL (ref 0.2–1.2)
Total Protein: 6.9 g/dL (ref 6.0–8.3)

## 2016-12-27 LAB — LIPID PANEL
CHOLESTEROL: 149 mg/dL (ref 0–200)
HDL: 56.8 mg/dL (ref >39.00)
LDL Cholesterol: 73 mg/dL (ref 0–99)
NonHDL: 91.98
TRIGLYCERIDES: 93 mg/dL (ref 0.0–149.0)
Total CHOL/HDL Ratio: 3
VLDL: 18.6 mg/dL (ref 0.0–40.0)

## 2016-12-27 LAB — HEMOGLOBIN A1C: HEMOGLOBIN A1C: 8.8 % — AB (ref 4.6–6.5)

## 2016-12-27 NOTE — Progress Notes (Signed)
0Patient ID: [name], female   DOB: April 08, 1951, 66 y.o.   MRN: [id number]     Subjective:  I acted as a Education administrator for Dr. Carollee Herter.  [name], [name]   Patient ID: [name], female    DOB: June 15, 1951, 66 y.o.   MRN: [id number]  Chief Complaint  Patient presents with  . Hypertension  . Diabetes  . Hyperlipidemia    HPI  Patient is in today for follow up blood pressure, diabetes, and cholesterol.  Has not checked sugars at home because of neuropathy in her hands, it hurts so bad.     Patient Care Team: Carollee Herter, Alferd Apa, DO as PCP - General (Family Medicine) Philemon Kingdom, MD as Consulting Physician (Internal Medicine) Wilford Corner, MD as Consulting Physician (Gastroenterology) Marin Olp Rudell Cobb, MD as Consulting Physician (Oncology) Camelia Phenes, DPM as Consulting Physician (Podiatry) Servando Salina, MD as Consulting Physician (Obstetrics and Gynecology)   Past Medical History:  Diagnosis Date  . Asthma   . Degenerative joint disease   . Diabetes mellitus   . Dyspnea   . GERD (gastroesophageal reflux disease)   . Hyperlipidemia    no longer on medication for this  . Hypertension    patient denies  . Neuropathy    bilateral hands and feet  . Sickle cell trait ([place])   . Sleep apnea   . stomach ca dx'd 2010   chemo/xrt comp 05/2009  . TIA (transient ischemic attack) 07/2015    Past Surgical History:  Procedure Laterality Date  . APPENDECTOMY    . CESAREAN SECTION    . COLON SURGERY     colonscopy  . COLONOSCOPY WITH PROPOFOL N/A 06/01/2016   Procedure: COLONOSCOPY WITH PROPOFOL;  Surgeon: Wilford Corner, MD;  Location: [facility] ENDOSCOPY;  Service: Endoscopy;  Laterality: N/A;  . ESOPHAGOGASTRODUODENOSCOPY N/A 10/15/2012   Procedure: ESOPHAGOGASTRODUODENOSCOPY (EGD);  Surgeon: Lear Ng, MD;  Location: [facility] ENDOSCOPY;  Service: Endoscopy;  Laterality: N/A;  . ESOPHAGOGASTRODUODENOSCOPY N/A 01/15/2016   Procedure:  ESOPHAGOGASTRODUODENOSCOPY (EGD);  Surgeon: Ronald Lobo, MD;  Location: [facility] ENDOSCOPY;  Service: Endoscopy;  Laterality: N/A;  . HERNIA REPAIR    . SHOULDER SURGERY     Left  . TOTAL HIP ARTHROPLASTY     Right  . TOTAL KNEE ARTHROPLASTY     Left    Family History  Problem Relation Age of Onset  . Diabetes Brother   . Alzheimer's disease Father 34  . Cancer Mother 68  . Diabetes Maternal Grandmother   . Cancer Maternal Grandfather        lung  . Suicidality Neg Hx   . Depression Neg Hx   . Dementia Neg Hx   . Anxiety disorder Neg Hx     Social History   Social History  . Marital status: Divorced    Spouse name: N/A  . Number of children: N/A  . Years of education: N/A   Occupational History  . Not on file.   Social History Main Topics  . Smoking status: Former Smoker    Packs/day: 1.00    Years: 15.00    Types: Cigarettes    Start date: 09/27/1973    Quit date: 10/15/1988  . Smokeless tobacco: Never Used     Comment: quit smoking 14 years ago  . Alcohol use No  . Drug use: No  . Sexual activity: No   Other Topics Concern  . Not on file   Social History Narrative  . No narrative  on file    Outpatient Medications Prior to Visit  Medication Sig Dispense Refill  . aspirin 81 MG tablet Take 81 mg by mouth at bedtime.    . Blood Glucose Calibration (OT ULTRA/FASTTK CNTRL SOLN) SOLN     . Blood Glucose Monitoring Suppl (ONE TOUCH ULTRA 2) w/Device KIT     . Blood Pressure Monitoring (B-D ASSURE BPM/MANUAL ARM CUFF) MISC Check blood pressure daily. 1 each 0  . DULoxetine (CYMBALTA) 60 MG capsule Take 1 capsule (60 mg total) by mouth 2 (two) times daily. 180 capsule 4  . gabapentin (NEURONTIN) 400 MG capsule Take 2 capsules (800 mg total) by mouth 3 (three) times daily. 180 capsule 8  . glimepiride (AMARYL) 4 MG tablet Take 1 tablet (4 mg total) by mouth 2 (two) times daily. 60 tablet 5  . Multiple Vitamin (MULTIVITAMIN WITH MINERALS) TABS tablet Take 1 tablet by  mouth daily.    Marland Kitchen omeprazole (PRILOSEC) 20 MG capsule Take 1 capsule (20 mg total) by mouth daily. 14 capsule 0  . ONE TOUCH ULTRA TEST test strip     . sucralfate (CARAFATE) 1 GM/10ML suspension Take 10 mLs (1 g total) by mouth 4 (four) times daily -  with meals and at bedtime. 420 mL 0  . traMADol (ULTRAM) 50 MG tablet Take 1 tablet (50 mg total) by mouth 4 (four) times daily. 120 tablet 1  . ondansetron (ZOFRAN ODT) 4 MG disintegrating tablet Take 1 tablet (4 mg total) by mouth every 8 (eight) hours as needed for nausea or vomiting. 20 tablet 0  . EMBEDA 30-1.2 MG CPCR Take 1 tablet by mouth 2 (two) times daily.     . fluticasone (FLONASE) 50 MCG/ACT nasal spray Place 2 sprays into both nostrils daily. 16 g 1   Facility-Administered Medications Prior to Visit  Medication Dose Route Frequency Provider Last Rate Last Dose  . sodium chloride 0.9 % injection 10 mL  10 mL Intravenous PRN Volanda Napoleon, MD   10 mL at 06/27/16 1138  . sodium chloride flush (NS) 0.9 % injection 10 mL  10 mL Intravenous PRN [place], Holli Humbles, NP   10 mL at 09/13/16 1146    Allergies  Allergen Reactions  . Adhesive [Tape] Other (See Comments)    Burn Skin  . Codeine Other (See Comments)    paranoid  . Zocor [Simvastatin - High Dose] Other (See Comments)    Muscle spasms  . Penicillins Rash    Has patient had a PCN reaction causing immediate rash, facial/tongue/throat swelling, SOB or lightheadedness with hypotension: Yes Has patient had a PCN reaction causing severe rash involving mucus membranes or skin necrosis: No Has patient had a PCN reaction that required hospitalization does not remember.  Has patient had a PCN reaction occurring within the last 10 years: No If all of the above answers are "NO", then may proceed with Cephalosporin use.     Review of Systems  Constitutional: Negative for fever and malaise/fatigue.  HENT: Negative for congestion.   Eyes: Negative for blurred vision.    Respiratory: Negative for cough and shortness of breath.   Cardiovascular: Negative for chest pain, palpitations and leg swelling.  Gastrointestinal: Negative for vomiting.  Musculoskeletal: Negative for back pain.  Skin: Negative for rash.  Neurological: Negative for loss of consciousness and headaches.       Objective:    Physical Exam  Constitutional: She is oriented to person, place, and time. She appears well-developed and well-nourished.  No distress.  HENT:  Head: Normocephalic and atraumatic.  Eyes: Conjunctivae and EOM are normal.  Neck: Normal range of motion. Neck supple. No JVD present. Carotid bruit is not present. No thyromegaly present.  Cardiovascular: Normal rate, regular rhythm and normal heart sounds.   No murmur heard. Pulmonary/Chest: Effort normal and breath sounds normal. No respiratory distress. She has no wheezes. She has no rales. She exhibits no tenderness.  Abdominal: Soft. Bowel sounds are normal. There is no tenderness.  Musculoskeletal: Normal range of motion. She exhibits no edema or deformity.  Neurological: She is alert and oriented to person, place, and time.  Skin: Skin is warm and dry. She is not diaphoretic.  Psychiatric: She has a normal mood and affect. Her behavior is normal. Judgment and thought content normal.  Nursing note and vitals reviewed. Sensory exam of the foot is normal, tested with the monofilament. Good pulses, no lesions or ulcers, good peripheral pulses.   BP 120/70 (BP Location: Right Arm, Cuff Size: Large)   Pulse 94   Temp 98.2 F (36.8 C) (Oral)   Resp 16   Ht 5' 7" (1.702 m)   Wt 225 lb (102.1 kg)   SpO2 97%   BMI 35.24 kg/m  Wt Readings from Last 3 Encounters:  12/30/16 220 lb (99.8 kg)  12/27/16 225 lb (102.1 kg)  12/17/16 215 lb (97.5 kg)   BP Readings from Last 3 Encounters:  12/30/16 130/83  12/27/16 120/70  12/17/16 118/76     Immunization History  Administered Date(s) Administered  .  Influenza,inj,Quad PF,36+ Mos 05/27/2014, 04/06/2015, 06/21/2016  . Pneumococcal Polysaccharide-23 07/07/2015  . Tdap 02/17/2015    Health Maintenance  Topic Date Due  . HIV Screening  03/04/2017 (Originally 03/23/1966)  . OPHTHALMOLOGY EXAM  06/21/2017 (Originally 05/28/2015)  . INFLUENZA VACCINE  02/22/2017  . PAP SMEAR  05/27/2017  . URINE MICROALBUMIN  06/21/2017  . HEMOGLOBIN A1C  06/28/2017  . FOOT EXAM  12/27/2017  . MAMMOGRAM  06/30/2018  . TETANUS/TDAP  02/16/2025  . COLONOSCOPY  06/01/2026  . Hepatitis C Screening  Completed    Lab Results  Component Value Date   WBC 8.3 12/30/2016   HGB 13.1 12/30/2016   HCT 38.3 12/30/2016   PLT 344 12/30/2016   GLUCOSE 201 (H) 12/30/2016   CHOL 149 12/27/2016   TRIG 93.0 12/27/2016   HDL 56.80 12/27/2016   LDLCALC 73 12/27/2016   ALT 21 12/30/2016   AST 23 12/30/2016   NA 142 12/30/2016   K 3.7 12/30/2016   CL 109 12/30/2016   CREATININE 1.31 (H) 12/30/2016   BUN 16 12/30/2016   CO2 22 12/30/2016   TSH 3.061 05/13/2016   INR 0.98 11/05/2010   HGBA1C 8.8 (H) 12/27/2016   MICROALBUR 0.7 06/21/2016    Lab Results  Component Value Date   TSH 3.061 05/13/2016   Lab Results  Component Value Date   WBC 8.3 12/30/2016   HGB 13.1 12/30/2016   HCT 38.3 12/30/2016   MCV 85.3 12/30/2016   PLT 344 12/30/2016   Lab Results  Component Value Date   NA 142 12/30/2016   K 3.7 12/30/2016   CHLORIDE 106 11/04/2016   CO2 22 12/30/2016   GLUCOSE 201 (H) 12/30/2016   BUN 16 12/30/2016   CREATININE 1.31 (H) 12/30/2016   BILITOT 0.9 12/30/2016   ALKPHOS 84 12/30/2016   AST 23 12/30/2016   ALT 21 12/30/2016   PROT 7.4 12/30/2016   ALBUMIN 3.6 12/30/2016  CALCIUM 9.2 12/30/2016   ANIONGAP 11 12/30/2016   EGFR 53 (L) 11/04/2016   GFR 48.17 (L) 12/27/2016   Lab Results  Component Value Date   CHOL 149 12/27/2016   Lab Results  Component Value Date   HDL 56.80 12/27/2016   Lab Results  Component Value Date   LDLCALC  73 12/27/2016   Lab Results  Component Value Date   TRIG 93.0 12/27/2016   Lab Results  Component Value Date   CHOLHDL 3 12/27/2016   Lab Results  Component Value Date   HGBA1C 8.8 (H) 12/27/2016         Assessment & Plan:   Problem List Items Addressed This Visit      Unprioritized   Essential hypertension   Relevant Orders   Comprehensive metabolic panel (Completed)    Other Visit Diagnoses    Multinodular goiter    -  Primary   Relevant Orders   US SOFT TISSUE HEAD AND NECK   DM (diabetes mellitus) type II uncontrolled, periph vascular disorder (HCC)       Relevant Orders   Comprehensive metabolic panel (Completed)   Hemoglobin A1c (Completed)   Hyperlipidemia LDL goal <70       Relevant Orders   Comprehensive metabolic panel (Completed)   Lipid panel (Completed)      I have discontinued Ms. Parveen's fluticasone. I am also having her maintain her multivitamin with minerals, aspirin, B-D ASSURE BPM/MANUAL ARM CUFF, OT ULTRA/FASTTK CNTRL SOLN, ONE TOUCH ULTRA 2, ONE TOUCH ULTRA TEST, traMADol, gabapentin, glimepiride, DULoxetine, sucralfate, omeprazole, and EMBEDA.  Meds ordered this encounter  Medications  . EMBEDA 50-2 MG CPCR    Sig: Take 1 capsule by mouth daily.    CMA served as Education administrator during this visit. History, Physical and Plan performed by medical provider. Documentation and orders reviewed and attested to.  Ann Held, DO

## 2016-12-27 NOTE — Patient Instructions (Signed)

## 2016-12-30 ENCOUNTER — Telehealth: Payer: Self-pay | Admitting: Family Medicine

## 2016-12-30 ENCOUNTER — Encounter (HOSPITAL_COMMUNITY): Payer: Self-pay

## 2016-12-30 ENCOUNTER — Emergency Department (HOSPITAL_COMMUNITY)
Admission: EM | Admit: 2016-12-30 | Discharge: 2016-12-30 | Disposition: A | Discharge status: Home / Self Care | Payer: Medicare HMO | Attending: Emergency Medicine | Admitting: Emergency Medicine

## 2016-12-30 DIAGNOSIS — Z96652 Presence of left artificial knee joint: Secondary | ICD-10-CM | POA: Diagnosis not present

## 2016-12-30 DIAGNOSIS — Z96641 Presence of right artificial hip joint: Secondary | ICD-10-CM | POA: Insufficient documentation

## 2016-12-30 DIAGNOSIS — R110 Nausea: Secondary | ICD-10-CM | POA: Diagnosis not present

## 2016-12-30 DIAGNOSIS — E119 Type 2 diabetes mellitus without complications: Secondary | ICD-10-CM | POA: Diagnosis not present

## 2016-12-30 DIAGNOSIS — K591 Functional diarrhea: Secondary | ICD-10-CM | POA: Diagnosis not present

## 2016-12-30 DIAGNOSIS — J45909 Unspecified asthma, uncomplicated: Secondary | ICD-10-CM | POA: Insufficient documentation

## 2016-12-30 DIAGNOSIS — Z7982 Long term (current) use of aspirin: Secondary | ICD-10-CM | POA: Insufficient documentation

## 2016-12-30 DIAGNOSIS — E1122 Type 2 diabetes mellitus with diabetic chronic kidney disease: Secondary | ICD-10-CM | POA: Diagnosis not present

## 2016-12-30 DIAGNOSIS — R630 Anorexia: Secondary | ICD-10-CM | POA: Diagnosis not present

## 2016-12-30 DIAGNOSIS — N183 Chronic kidney disease, stage 3 (moderate): Secondary | ICD-10-CM | POA: Insufficient documentation

## 2016-12-30 DIAGNOSIS — I129 Hypertensive chronic kidney disease with stage 1 through stage 4 chronic kidney disease, or unspecified chronic kidney disease: Secondary | ICD-10-CM | POA: Insufficient documentation

## 2016-12-30 DIAGNOSIS — Z85028 Personal history of other malignant neoplasm of stomach: Secondary | ICD-10-CM | POA: Insufficient documentation

## 2016-12-30 DIAGNOSIS — R1013 Epigastric pain: Secondary | ICD-10-CM | POA: Diagnosis present

## 2016-12-30 DIAGNOSIS — Z79899 Other long term (current) drug therapy: Secondary | ICD-10-CM | POA: Diagnosis not present

## 2016-12-30 LAB — URINALYSIS, ROUTINE W REFLEX MICROSCOPIC
BILIRUBIN URINE: NEGATIVE
Glucose, UA: NEGATIVE mg/dL
HGB URINE DIPSTICK: NEGATIVE
KETONES UR: 20 mg/dL — AB
Nitrite: NEGATIVE
PROTEIN: NEGATIVE mg/dL
Specific Gravity, Urine: 1.014 (ref 1.005–1.030)
pH: 5 (ref 5.0–8.0)

## 2016-12-30 LAB — CBC
HEMATOCRIT: 38.3 % (ref 36.0–46.0)
HEMOGLOBIN: 13.1 g/dL (ref 12.0–15.0)
MCH: 29.2 pg (ref 26.0–34.0)
MCHC: 34.2 g/dL (ref 30.0–36.0)
MCV: 85.3 fL (ref 78.0–100.0)
Platelets: 344 10*3/uL (ref 150–400)
RBC: 4.49 MIL/uL (ref 3.87–5.11)
RDW: 14.7 % (ref 11.5–15.5)
WBC: 8.3 10*3/uL (ref 4.0–10.5)

## 2016-12-30 LAB — COMPREHENSIVE METABOLIC PANEL
ALT: 21 U/L (ref 14–54)
ANION GAP: 11 (ref 5–15)
AST: 23 U/L (ref 15–41)
Albumin: 3.6 g/dL (ref 3.5–5.0)
Alkaline Phosphatase: 84 U/L (ref 38–126)
BILIRUBIN TOTAL: 0.9 mg/dL (ref 0.3–1.2)
BUN: 16 mg/dL (ref 6–20)
CO2: 22 mmol/L (ref 22–32)
Calcium: 9.2 mg/dL (ref 8.9–10.3)
Chloride: 109 mmol/L (ref 101–111)
Creatinine, Ser: 1.31 mg/dL — ABNORMAL HIGH (ref 0.44–1.00)
GFR calc non Af Amer: 42 mL/min — ABNORMAL LOW (ref >60)
GFR, EST AFRICAN AMERICAN: 49 mL/min — AB (ref >60)
GLUCOSE: 201 mg/dL — AB (ref 65–99)
POTASSIUM: 3.7 mmol/L (ref 3.5–5.1)
Sodium: 142 mmol/L (ref 135–145)
TOTAL PROTEIN: 7.4 g/dL (ref 6.5–8.1)

## 2016-12-30 LAB — LIPASE, BLOOD: Lipase: 15 U/L (ref 11–51)

## 2016-12-30 MED ORDER — ONDANSETRON 4 MG PO TBDP: 4.0000 mg | ORAL_TABLET | Freq: Three times a day (TID) | ORAL | 0 refills | Status: DC | PRN

## 2016-12-30 MED ORDER — SODIUM CHLORIDE 0.9 % IV BOLUS (SEPSIS)
1000.0000 mL | Freq: Once | INTRAVENOUS | Status: AC
  Administered 2016-12-30: 1000 mL via INTRAVENOUS

## 2016-12-30 MED ORDER — HEPARIN SOD (PORK) LOCK FLUSH 100 UNIT/ML IV SOLN
500.0000 [IU] | Freq: Once | INTRAVENOUS | Status: AC
Start: 2016-12-30 — End: 2016-12-30
  Administered 2016-12-30: 500 [IU]
  Filled 2016-12-30: qty 5

## 2016-12-30 NOTE — Telephone Encounter (Signed)
Patient called stating she is feeling very bad. States she has severe diarrhea and nausea. Transferred to [facility]

## 2016-12-30 NOTE — ED Provider Notes (Addendum)
[facility] DEPT Provider Note   CSN: [id number] Arrival date & time: 12/30/16  1425     History   Chief Complaint Chief Complaint  Patient presents with  . Abdominal Pain  . Nausea    HPI [name] is a 67 y.o. female.  HPI  CC: loss of appetite  Onset/Duration: several weeks Timing: constant Severity: moderate Modifying Factors:  Improved by: nothing  Worsened by: nothing Associated Signs/Symptoms:  Pertinent (+): abd cramping, diarrhea  Pertinent (-): fever, chills, nausea, vomiting, chest pain, sob, dysuria Context: Patient has a history of stomach cancer status post chemotherapy/radiation therapy to his been in remission. Patient was seen here on 5/26 for epigastric abdominal pain and obtained a CT scan which revealed utilization of the small bowel. Patient was instructed to initiate MiraLAX regimen. Patient also given GERD/gastritis medicine. She reports that she's been compliant with this. Patient also reports that since she's been to a chronic pain medicine clinic who is put her on narcotic pain medicine and instructed her to continue the [place].  Past Medical History:  Diagnosis Date  . Asthma   . Degenerative joint disease   . Diabetes mellitus   . Dyspnea   . GERD (gastroesophageal reflux disease)   . Hyperlipidemia    no longer on medication for this  . Hypertension    patient denies  . Neuropathy    bilateral hands and feet  . Sickle cell trait ([place])   . Sleep apnea   . stomach ca dx'd 2010   chemo/xrt comp 05/2009  . TIA (transient ischemic attack) 07/2015    Patient Active Problem List   Diagnosis Date Noted  . Fatty liver 03/24/2016  . Weakness 03/23/2016  . Unexplained weight loss 03/23/2016  . Melena 03/23/2016  . Hypotension 02/03/2016  . Syncope 02/03/2016  . Hypomagnesemia 02/03/2016  . AKI (acute kidney injury) ([place]) 02/03/2016  . Gastrointestinal dysmotility 01/20/2016  . CKD (chronic kidney disease), stage III 01/20/2016    . Abscess of right thigh 01/20/2016  . Chronic pain syndrome 01/15/2016  . Diarrhea 01/15/2016  . Generalized abdominal pain 01/14/2016  . Uncontrolled type 2 diabetes mellitus with diabetic polyneuropathy, without long-term current use of insulin ([place]) 12/30/2015  . Morbid obesity due to excess calories ([place]) 05/05/2015  . Diabetic polyneuropathy associated with type 2 diabetes mellitus ([place]) 01/02/2015  . Acute bacterial sinusitis 01/02/2015  . Onychomycosis 11/04/2014  . Pain in lower limb 11/04/2014  . Multinodular goiter (nontoxic) 10/16/2014  . CVA (cerebral infarction) 08/11/2014  . Iron deficiency anemia 05/20/2014  . Major depressive disorder, single episode, moderate ([place]) 05/20/2014  . Dizziness of unknown cause 04/28/2014  . MDD (major depressive disorder), recurrent episode, moderate ([place]) 01/28/2014  . GERD (gastroesophageal reflux disease) 01/02/2014  . Nausea 10/15/2012  . Stomach cancer ([place]) 12/10/2008  . CHRONIC RESPIRATORY FAILURE 01/03/2008  . Hyperlipidemia 06/27/2007  . OBESITY, MORBID 06/27/2007  . SLEEP APNEA, OBSTRUCTIVE 06/27/2007  . Essential hypertension 06/27/2007  . Asthma 06/27/2007  . OSTEOARTHRITIS  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 06/27/2007    Past Surgical History:  Procedure Laterality Date  . APPENDECTOMY    . CESAREAN SECTION    . COLON SURGERY     colonscopy  . COLONOSCOPY WITH PROPOFOL N/A 06/01/2016   Procedure: COLONOSCOPY WITH PROPOFOL;  Surgeon: Wilford Corner, MD;  Location: [facility] ENDOSCOPY;  Service: Endoscopy;  Laterality: N/A;  . ESOPHAGOGASTRODUODENOSCOPY N/A 10/15/2012   Procedure: ESOPHAGOGASTRODUODENOSCOPY (EGD);  Surgeon: Lear Ng, MD;  Location: [facility] ENDOSCOPY;  Service: Endoscopy;  Laterality: N/A;  . ESOPHAGOGASTRODUODENOSCOPY N/A 01/15/2016   Procedure: ESOPHAGOGASTRODUODENOSCOPY (EGD);  Surgeon: Ronald Lobo, MD;  Location: [facility] ENDOSCOPY;  Service: Endoscopy;  Laterality: N/A;  . HERNIA REPAIR    . SHOULDER  SURGERY     Left  . TOTAL HIP ARTHROPLASTY     Right  . TOTAL KNEE ARTHROPLASTY     Left    OB History    Gravida Para Term Preterm AB Living   4 4       4    SAB TAB Ectopic Multiple Live Births                   Home Medications    Prior to Admission medications   Medication Sig Start Date End Date Taking? Authorizing Provider  aspirin 81 MG tablet Take 81 mg by mouth at bedtime.   Yes [provider]  DULoxetine (CYMBALTA) 60 MG capsule Take 1 capsule (60 mg total) by mouth 2 (two) times daily. 11/15/16  Yes Lowne Chase, Yvonne R, DO  EMBEDA 50-2 MG CPCR Take 1 capsule by mouth daily. 12/26/16  Yes [provider]  gabapentin (NEURONTIN) 400 MG capsule Take 2 capsules (800 mg total) by mouth 3 (three) times daily. 11/15/16  Yes Roma Schanz R, DO  glimepiride (AMARYL) 4 MG tablet Take 1 tablet (4 mg total) by mouth 2 (two) times daily. 11/15/16  Yes Ann Held, DO  Multiple Vitamin (MULTIVITAMIN WITH MINERALS) TABS tablet Take 1 tablet by mouth daily.   Yes [provider]  omeprazole (PRILOSEC) 20 MG capsule Take 1 capsule (20 mg total) by mouth daily. 12/17/16  Yes Duffy Bruce, MD  ondansetron (ZOFRAN ODT) 4 MG disintegrating tablet Take 1 tablet (4 mg total) by mouth every 8 (eight) hours as needed for nausea or vomiting. 12/08/16  Yes Carollee Herter, Yvonne R, DO  sucralfate (CARAFATE) 1 GM/10ML suspension Take 10 mLs (1 g total) by mouth 4 (four) times daily -  with meals and at bedtime. 12/17/16  Yes Duffy Bruce, MD  traMADol (ULTRAM) 50 MG tablet Take 1 tablet (50 mg total) by mouth 4 (four) times daily. 11/15/16  Yes Roma Schanz R, DO  Blood Glucose Calibration (OT ULTRA/FASTTK CNTRL SOLN) SOLN  02/17/16   [provider]  Blood Glucose Monitoring Suppl (ONE TOUCH ULTRA 2) w/Device KIT  02/17/16   [provider]  Blood Pressure Monitoring (B-D ASSURE BPM/MANUAL ARM CUFF) MISC Check blood pressure daily.  02/18/16   Ann Held, DO  ONE TOUCH ULTRA TEST test strip  02/17/16   [provider]    Family History Family History  Problem Relation Age of Onset  . Diabetes Brother   . Alzheimer's disease Father 44  . Cancer Mother 63  . Diabetes Maternal Grandmother   . Cancer Maternal Grandfather        lung  . Suicidality Neg Hx   . Depression Neg Hx   . Dementia Neg Hx   . Anxiety disorder Neg Hx     Social History Social History  Substance Use Topics  . Smoking status: Former Smoker    Packs/day: 1.00    Years: 15.00    Types: Cigarettes    Start date: 09/27/1973    Quit date: 10/15/1988  . Smokeless tobacco: Never Used     Comment: quit smoking 14 years ago  . Alcohol use No     Allergies  Adhesive [tape]; Codeine; Zocor [simvastatin - high dose]; and Penicillins   Review of Systems Review of Systems All other systems are reviewed and are negative for acute change except as noted in the HPI   Physical Exam Updated Vital Signs BP (!) 106/55 (BP Location: Left Arm)   Pulse 96   Temp 98.1 F (36.7 C) (Oral)   Resp 18   Ht 5' 7"  (1.702 m)   Wt 99.8 kg (220 lb)   SpO2 100%   BMI 34.46 kg/m   Physical Exam  Constitutional: She is oriented to person, place, and time. She appears well-developed and well-nourished. No distress.  HENT:  Head: Normocephalic and atraumatic.  Nose: Nose normal.  Eyes: Conjunctivae and EOM are normal. Pupils are equal, round, and reactive to light. Right eye exhibits no discharge. Left eye exhibits no discharge. No scleral icterus.  Neck: Normal range of motion. Neck supple.  Cardiovascular: Normal rate and regular rhythm.  Exam reveals no gallop and no friction rub.   No murmur heard. Pulmonary/Chest: Effort normal and breath sounds normal. No stridor. No respiratory distress. She has no rales.  Abdominal: Soft. She exhibits no distension. There is no tenderness. There is no rigidity, no rebound, no guarding, no CVA  tenderness, no tenderness at McBurney's point and negative Murphy's sign.  Musculoskeletal: She exhibits no edema or tenderness.  Neurological: She is alert and oriented to person, place, and time.  Skin: Skin is warm and dry. No rash noted. She is not diaphoretic. No erythema.  Psychiatric: She has a normal mood and affect.  Vitals reviewed.    ED Treatments / Results  Labs (all labs ordered are listed, but only abnormal results are displayed) Labs Reviewed  COMPREHENSIVE METABOLIC PANEL - Abnormal; Notable for the following:       Result Value   Glucose, Bld 201 (*)    Creatinine, Ser 1.31 (*)    GFR calc non Af Amer 42 (*)    GFR calc Af Amer 49 (*)    All other components within normal limits  URINALYSIS, ROUTINE W REFLEX MICROSCOPIC - Abnormal; Notable for the following:    APPearance HAZY (*)    Ketones, ur 20 (*)    Leukocytes, UA LARGE (*)    Bacteria, UA RARE (*)    Squamous Epithelial / LPF 6-30 (*)    All other components within normal limits  LIPASE, BLOOD  CBC    EKG  EKG Interpretation None       Radiology No results found.  Procedures Procedures (including critical care time)  Medications Ordered in ED Medications  sodium chloride 0.9 % bolus 1,000 mL (0 mLs Intravenous Stopped 12/30/16 1834)  heparin lock flush 100 unit/mL (500 Units Intracatheter Given 12/30/16 1931)     Initial Impression / Assessment and Plan / ED Course  I have reviewed the triage vital signs and the nursing notes.  Pertinent labs & imaging results that were available during my care of the patient were reviewed by me and considered in my medical decision making (see chart for details).     Patient is afebrile with stable vital signs, nontoxic, well-hydrated. Abdomen benign. Labs grossly reassuring. Patient provided with IV hydration and nausea medicine. Able to tolerate by mouth.  Given her benign abdomen and recent CT scan revealing [place] a call her from her prior cancer, I  don't feel that a repeat scan is warranted at this time.  Low suspicion for serious intra-abdominal inflammatory/infectious process.  The  patient is safe for discharge with strict return precautions.   Final Clinical Impressions(s) / ED Diagnoses   Final diagnoses:  Functional diarrhea  Loss of appetite   Disposition: Discharge  Condition: Good  I have discussed the results, Dx and Tx plan with the patient and daughter who expressed understanding and agree(s) with the plan. Discharge instructions discussed at great length. The patient and daughter were given strict return precautions who verbalized understanding of the instructions. No further questions at time of discharge.    New Prescriptions   No medications on file    Follow Up: Ann Held, DO [address] [place] [address] [phone]   in 3-5 days, If symptoms do not improve or  worsen        [place], Grayce Sessions, MD 12/30/16 [phone]

## 2016-12-30 NOTE — ED Triage Notes (Signed)
Patient states she had constipation 2 weeks ago. Yesterday she states she had 7 semi-formed stools and today she had 1 diarrheal stool. Patient also c/o nausea and states she vomited x 1 yesterday.

## 2016-12-30 NOTE — Telephone Encounter (Signed)
Patient Name: [name] DOB: [birth year] Initial Comment Caller says she has been sick for two days. has not taken any meds, does want to eat, and has had a lot of BMS that are soft, 5 yesterday, 3 today. Not diarrhea. Nurse Assessment Nurse: Yolanda Bonine, RN, Erin Date/Time (Eastern Time): 12/30/2016 12:47:27 PM Confirm and document reason for call. If symptomatic, describe symptoms. ---Caller says she has been sick for two days. Vomited x1. has not taken any meds, does want to eat or drink, and has had a lot of BMS that are soft, 5 yesterday, 3 today. Not diarrhea. Pt has DM. saw Dr Cheri Rous on Tuesday for f/u from ER visit 2 weeks ago due to constipation. Has not checked BS, never checks it. Does the patient have any new or worsening symptoms? ---Yes Will a triage be completed? ---Yes Related visit to physician within the last 2 weeks? ---Yes Does the PT have any chronic conditions? (i.e. diabetes, asthma, etc.) ---Yes List chronic conditions. ---DM GERD NEUROPATHY Is this a behavioral health or substance abuse call? ---No Guidelines Guideline Title Affirmed Question Affirmed Notes Nausea [1] Drinking very little AND [2] dehydration suspected (e.g., no urine > 12 hours, very dry mouth, very lightheaded) Final Disposition User Go to ED Now (or PCP triage) Yolanda Bonine, RN, Erin Comments EVERY TIME "I GO TO THE BR i have a loose stool." attempted to call office for appointment. "Office is open, appt scheduled vs sending pt to the ED" Sent pt to ED called pt back. Told her she needed to go on to the ED. Asked if I could call her daughter for her, she said she was there and would ask if she would take her. Referrals GO TO FACILITY UNDECIDED GO TO FACILITY UNDECIDED Disagree/Comply: Comply

## 2016-12-30 NOTE — ED Notes (Addendum)
Pt refusing to have her port accessed at this time until it is numbed from emla cream

## 2016-12-31 ENCOUNTER — Ambulatory Visit (HOSPITAL_BASED_OUTPATIENT_CLINIC_OR_DEPARTMENT_OTHER): Payer: Medicare HMO

## 2016-12-31 IMAGING — CT CT RENAL STONE PROTOCOL
2 of 4 series · 14 of 46 positions shown, 16 images · non-contrast
Comparison: 10/15/2013 and earlier.

CLINICAL DATA: 64-year-old presenting with 1 week history of right
upper quadrant and left upper quadrant abdominal pain and left flank
pain associated with nausea and vomiting. Patient states her urine
color has been dark. Surgical history includes partial gastrectomy
for gastric cancer with gastrojejunostomy, appendectomy and hernia
repair.

EXAM:
CT ABDOMEN AND PELVIS WITHOUT CONTRAST
TECHNIQUE: Multidetector CT imaging of the abdomen and pelvis was performed
following the standard protocol without IV contrast.

[Series 2: axial st · axial · 0.98mm/px · z∈[-388,+12]mm · 11 of 97 slices shown, 13 images]
[im 9/97  soft-tissue]
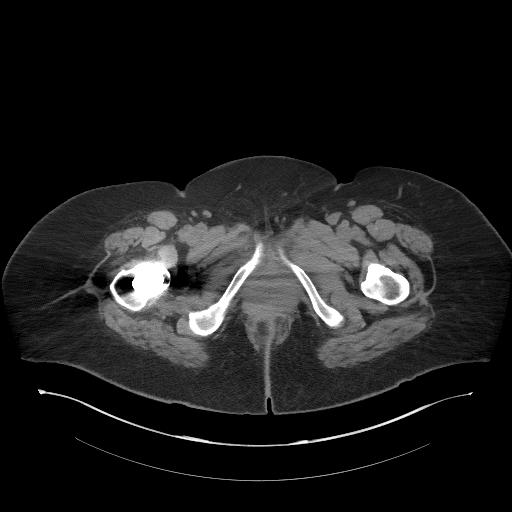
[im 9/97  bone]
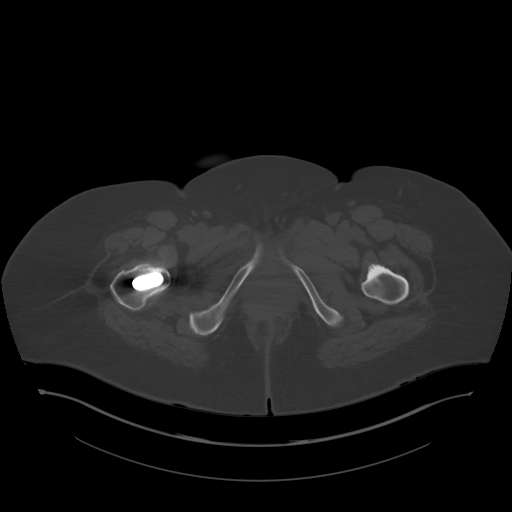
[im 17/97  soft-tissue]
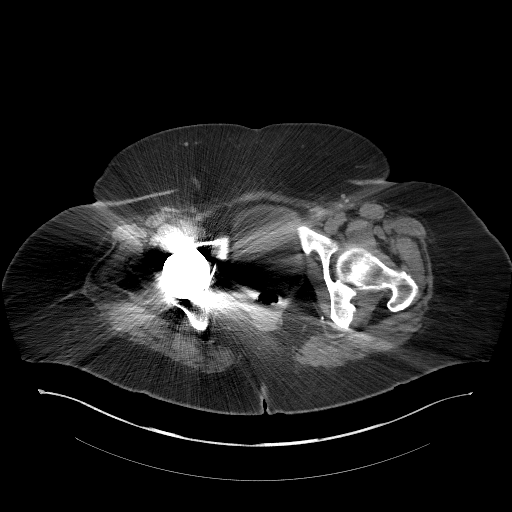
[im 25/97  soft-tissue]
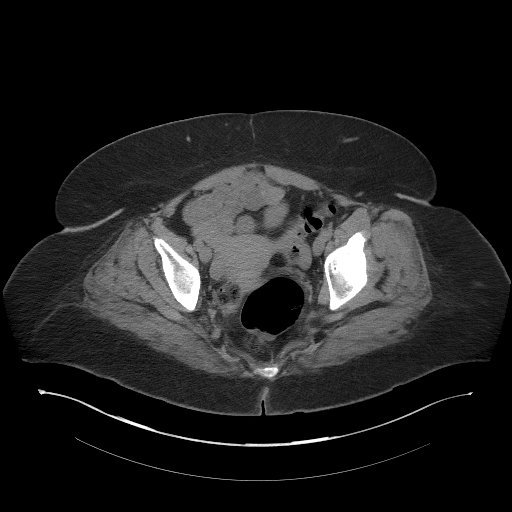
[im 33/97  soft-tissue]
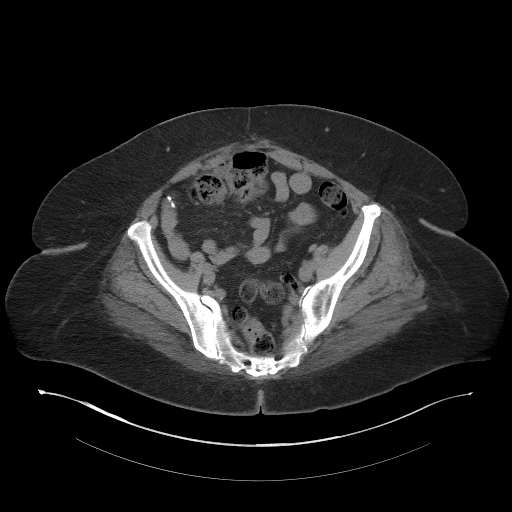
[im 41/97  soft-tissue]
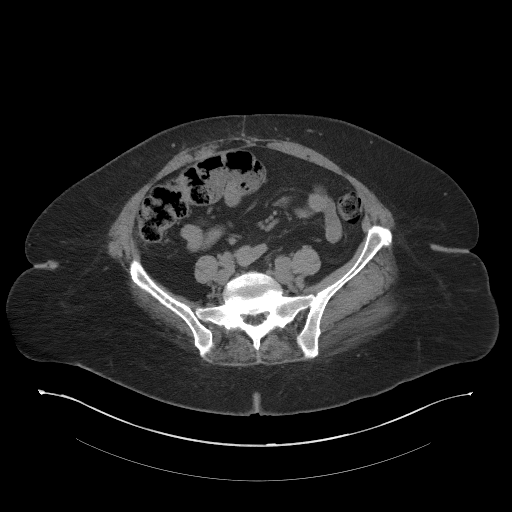
[im 49/97  soft-tissue]
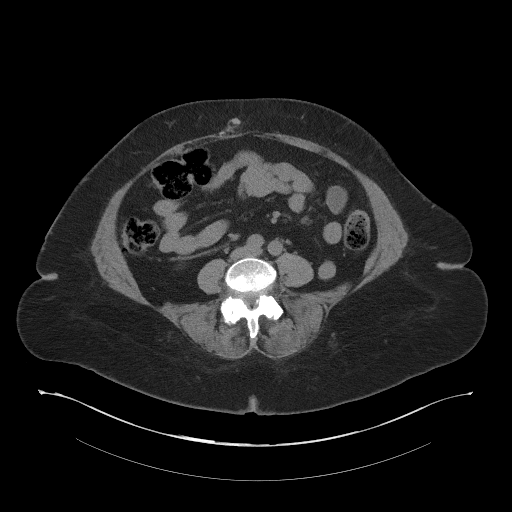
[im 57/97  soft-tissue]
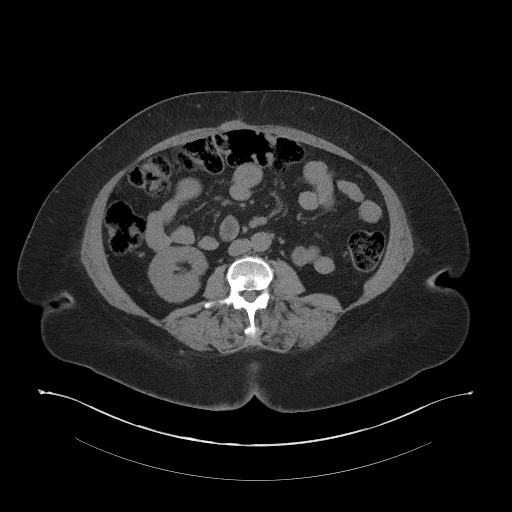
[im 65/97  soft-tissue]
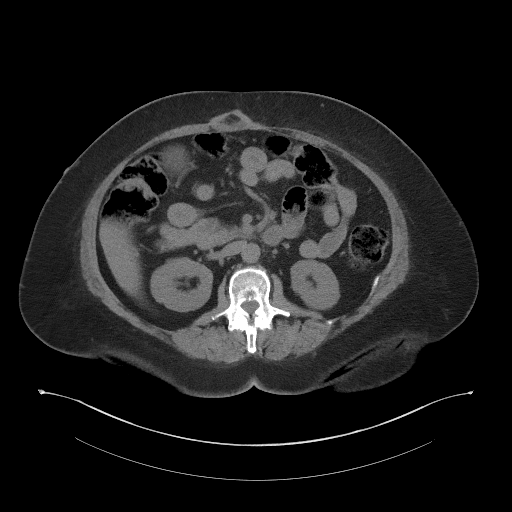
[im 73/97  soft-tissue]
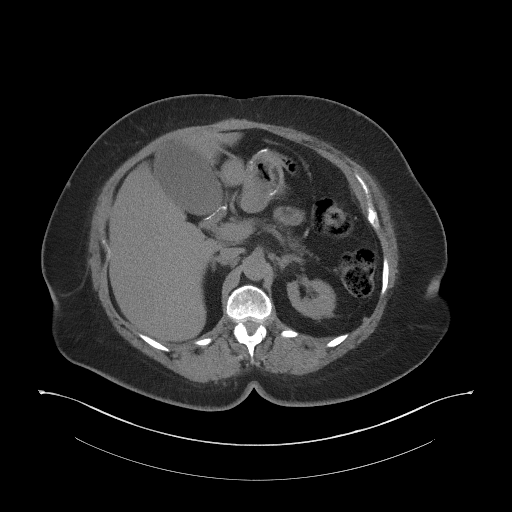
[im 73/97  bone]
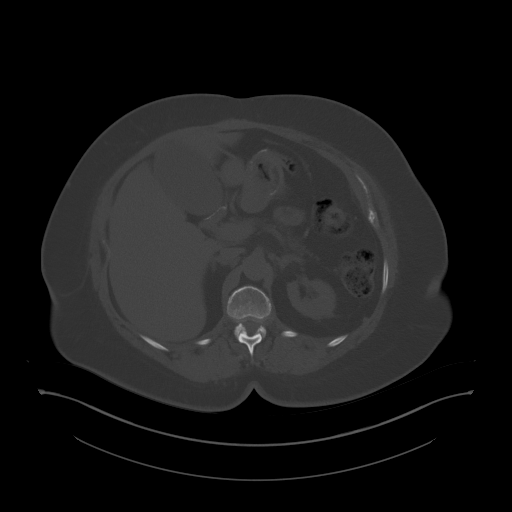
[im 81/97  soft-tissue]
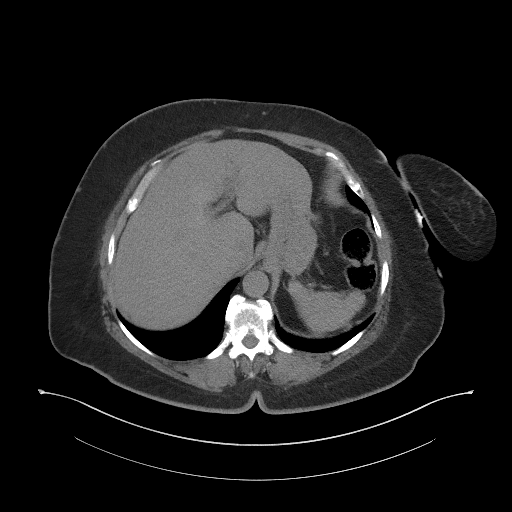
[im 89/97  soft-tissue]
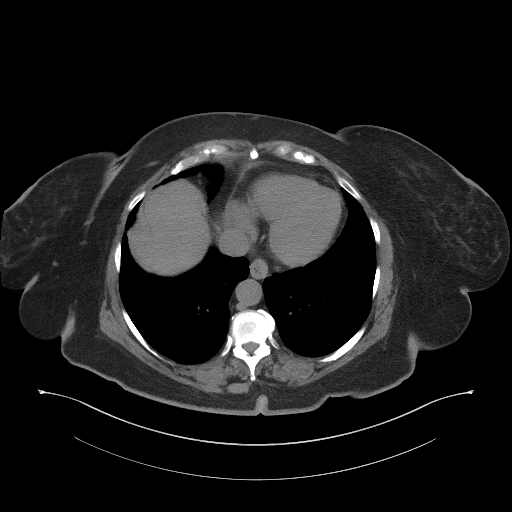

[Series 5: coronal st · coronal · 0.94mm/px · 3 of 101 slices shown]
[im 34/101  soft-tissue]
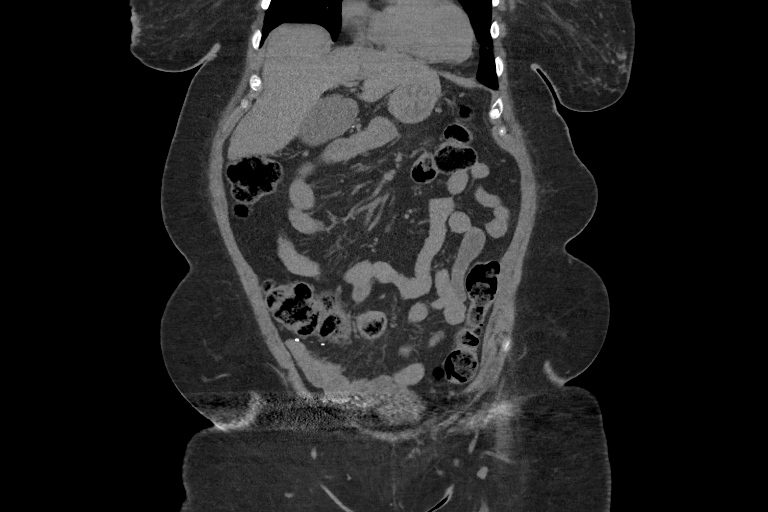
[im 45/101  soft-tissue]
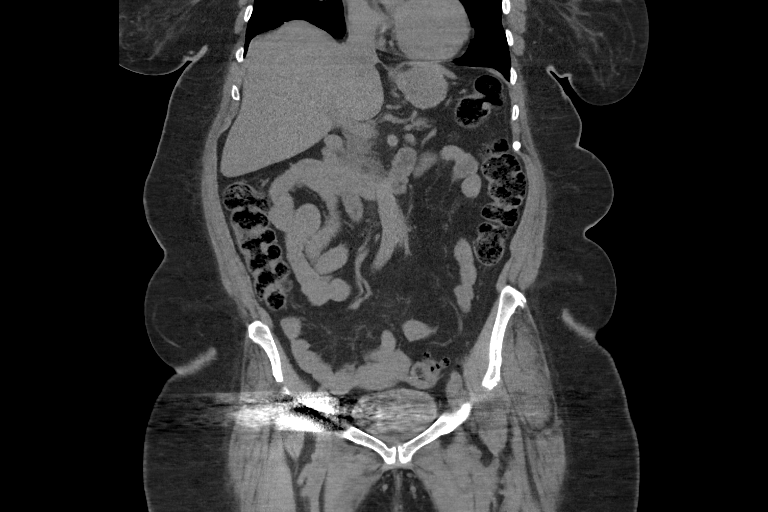
[im 56/101  soft-tissue]
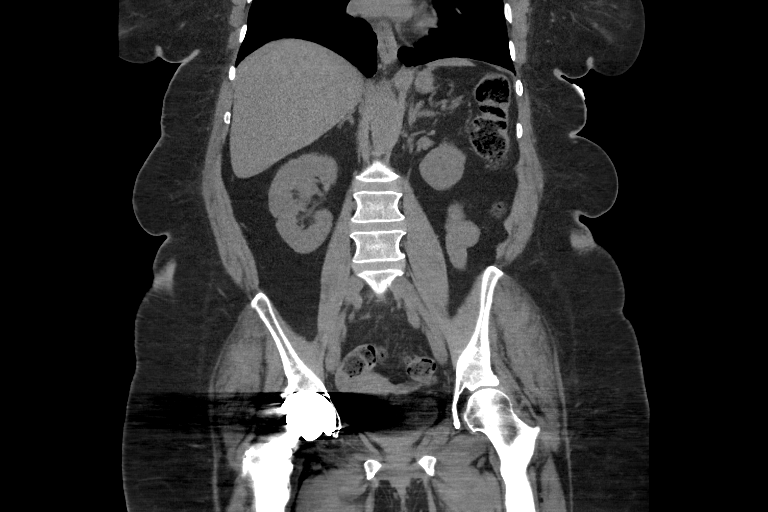

[14 of 46 positions shown; findings below may reference images not displayed]

FINDINGS: Beam hardening streak artifact from the patient's right hip
prosthesis obscures portions of the lower pelvis.

Lower chest:  Heart size normal.  Visualized lung bases clear.

Hepatobiliary: Normal unenhanced appearance of the liver. Borderline
gallbladder distention without evidence of cholelithiasis or acute
cholecystitis.

Pancreas: Marked atrophy without focal parenchymal abnormality or
inflammation.

Spleen: Normal unenhanced appearance.

Adrenals/Urinary Tract: Normal appearing adrenal glands. Scarring
involving the upper pole of the mildly atrophic left kidney,
unchanged. No evidence of urinary tract calculi or obstruction on
either side. Allowing for the unenhanced technique, no focal
parenchymal abnormality involving either kidney. Urinary bladder
unremarkable.

Stomach/Bowel: Prior distal gastrectomy and gastrojejunostomy
without complication. Small hiatal hernia. Circumferential
thickening of the wall of the distal esophagus. Normal-appearing
small bowel. Mobile cecum extending to the midline of the upper
pelvis. Moderate diffuse colonic stool burden. Sigmoid colon
diverticulosis without evidence of acute diverticulitis. Surgically
absent appendix.

Vascular/Lymphatic: Mild aortoiliofemoral atherosclerosis without
aneurysm. No pathologic lymphadenopathy.

Reproductive: Normal-appearing uterus and ovaries without evidence
of adnexal mass.

Other: Prior anterior abdominal wall hernia repair without
recurrence. Chronic scarring involving the right rectus muscle above
the umbilicus at the site of the prior surgical scar.

Musculoskeletal: Prior right hip arthroplasty with anatomic
alignment. Degenerative disc disease and spondylosis involving the
lower thoracic spine. Severe facet degenerative changes at L4-5 and
L5-S1. No acute abnormalities.
IMPRESSION: 1. No acute abnormalities involving the abdomen or pelvis.
2. No evidence of urinary tract calculi or obstruction.
3. Small hiatal hernia. Circumferential wall thickening involving
the visualized distal esophagus likely indicates chronic GE reflux
disease.
4. Prior distal gastrectomy and gastrojejunostomy without
complications.
5. Sigmoid colon diverticulosis without evidence of acute
diverticulitis.
6. Mild left renal atrophy with scarring involving the upper pole,
unchanged.

## 2017-01-02 ENCOUNTER — Other Ambulatory Visit: Payer: Self-pay | Admitting: Family Medicine

## 2017-01-02 DIAGNOSIS — E119 Type 2 diabetes mellitus without complications: Secondary | ICD-10-CM

## 2017-01-19 IMAGING — US US EXTREM LOW VENOUS*R*
1 series · 13 of 24 positions shown · non-contrast
Comparison: None.

CLINICAL DATA: Right popliteal fossa pain for 1 month.



[Series 1: us extrem low venous*right* · 0.09mm/px · 28 acquisitions, 13 frames shown]
[im 1/28]
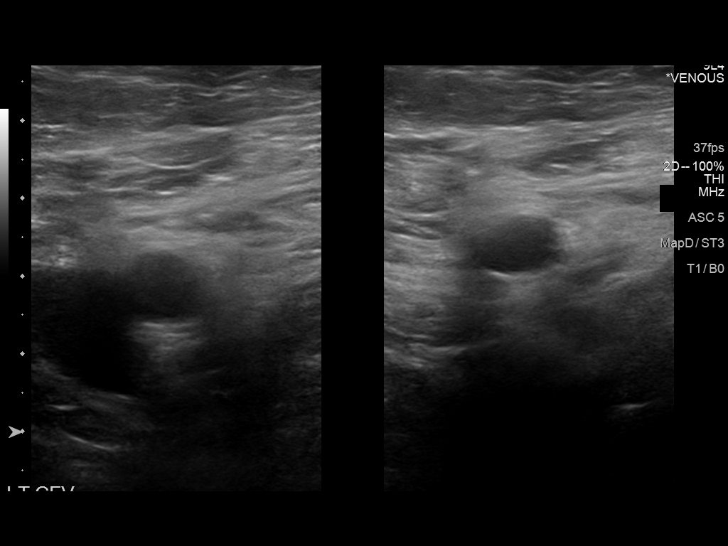
[im 3/28]
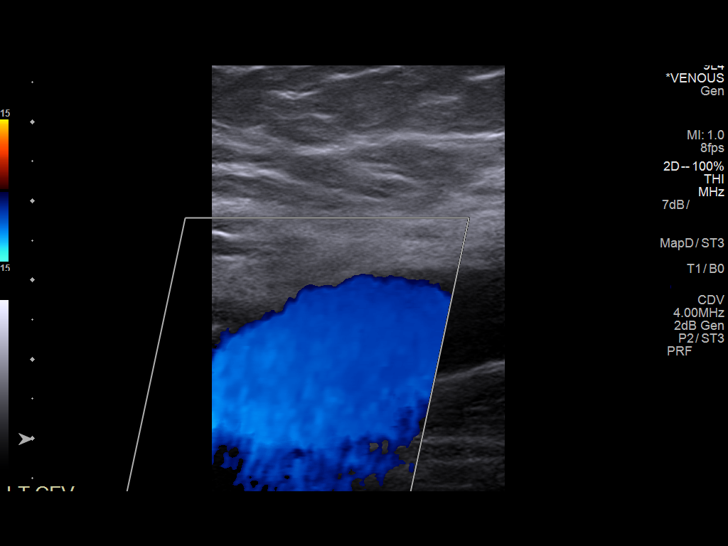
[im 5/28]
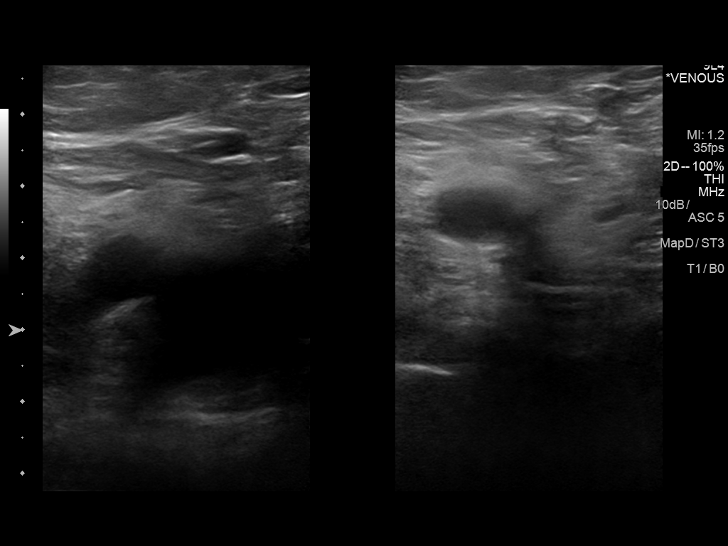
[im 8/28]
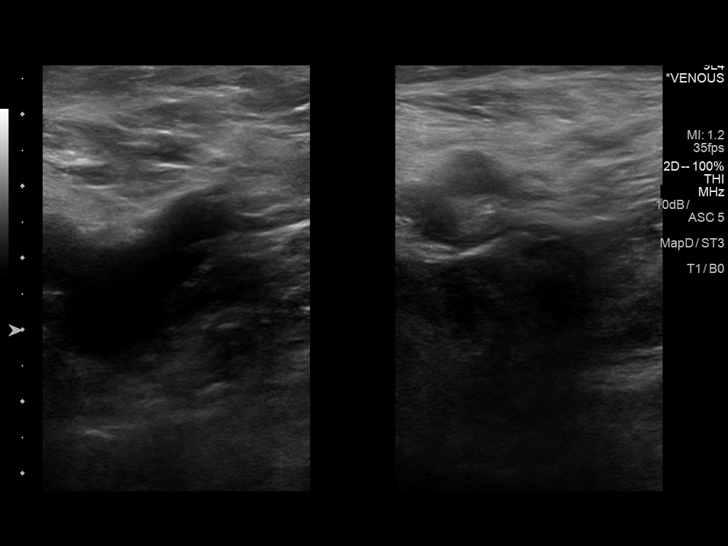
[im 10/28]
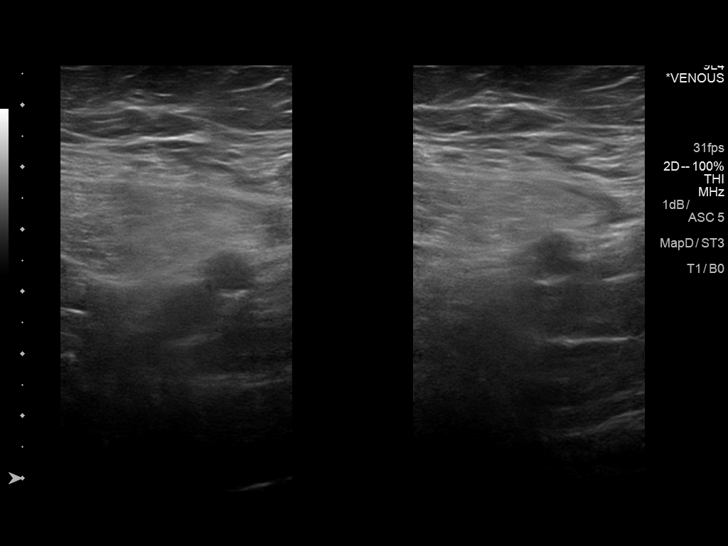
[im 12/28]
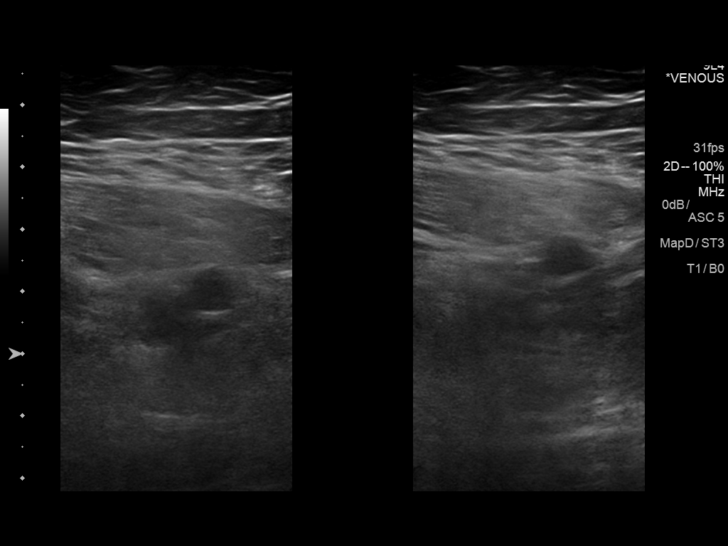
[im 16/28]
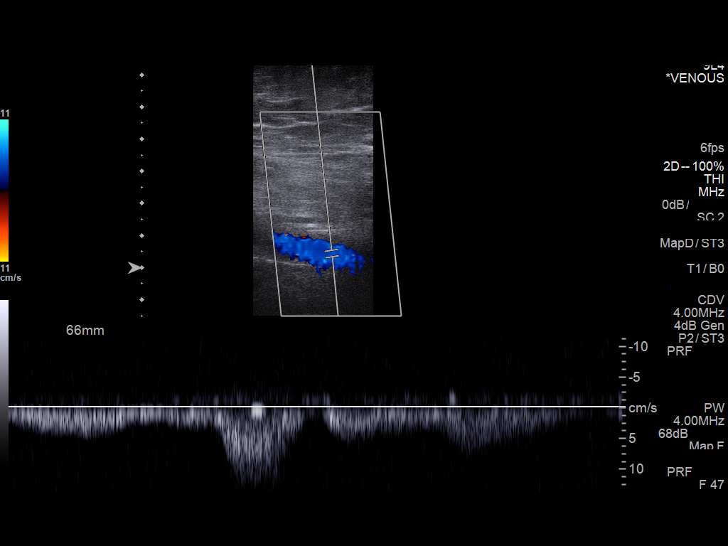
[im 17/28]
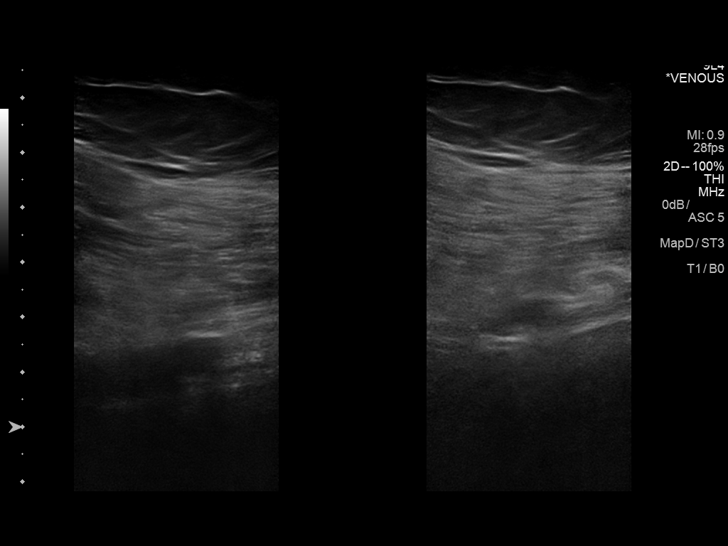
[im 19/28]
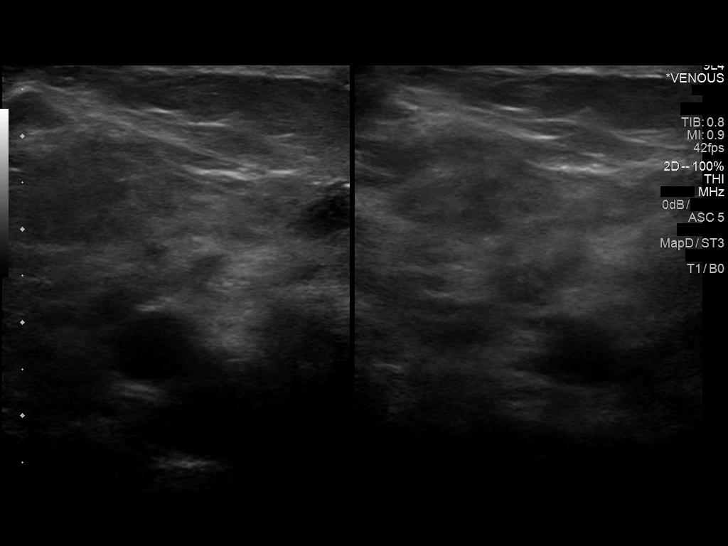
[im 22/28]
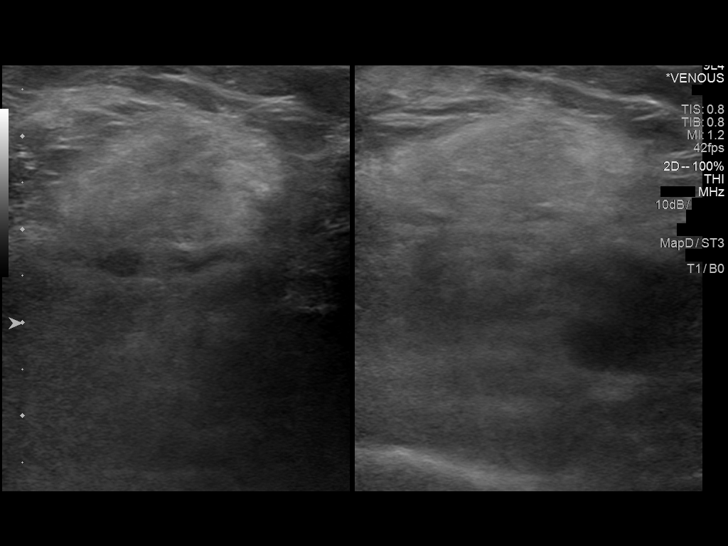
[im 24/28]
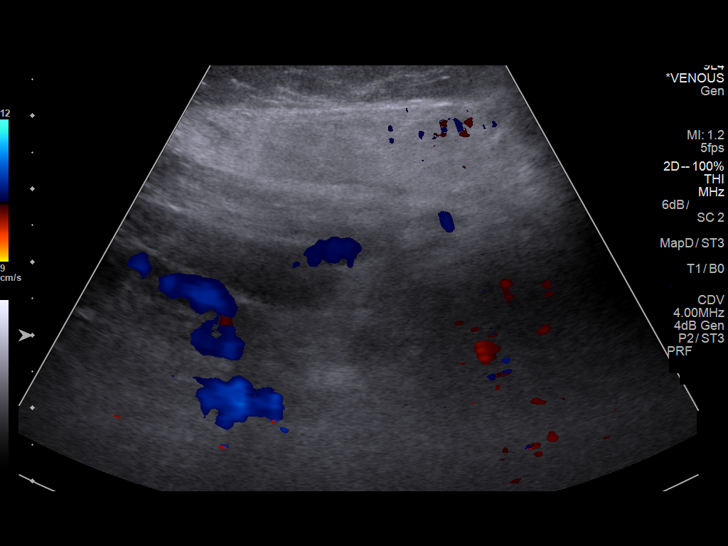
[im 25/28]
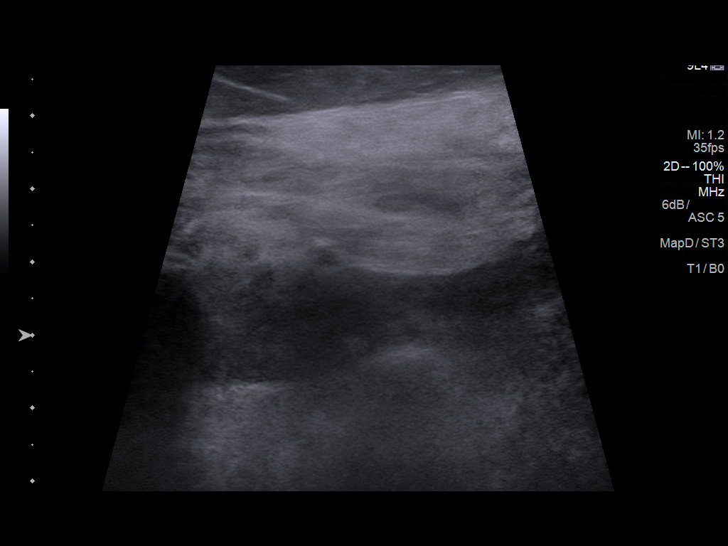
[im 28/28]
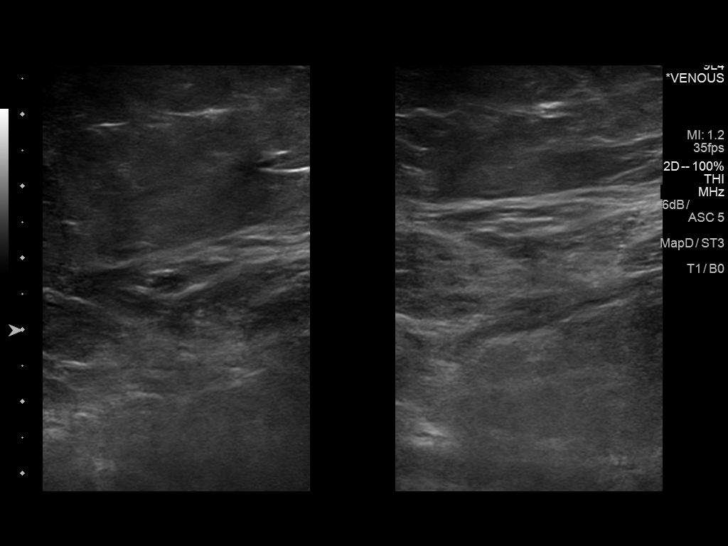

[13 of 24 positions shown; findings below may reference images not displayed]

FINDINGS: Contralateral Common Femoral Vein: Respiratory phasicity is normal
and symmetric with the symptomatic side. No evidence of thrombus.
Normal compressibility.

Common Femoral Vein: No evidence of thrombus. Normal
compressibility, respiratory phasicity and response to augmentation.

Saphenofemoral Junction: No evidence of thrombus. Normal
compressibility and flow on color Doppler imaging.

Profunda Femoral Vein: No evidence of thrombus. Normal
compressibility and flow on color Doppler imaging.

Femoral Vein: No evidence of thrombus. Normal compressibility,
respiratory phasicity and response to augmentation.

Popliteal Vein: No evidence of thrombus. Normal compressibility,
respiratory phasicity and response to augmentation.

Calf Veins: No evidence of thrombus. Normal compressibility and flow
on color Doppler imaging.

Superficial Great Saphenous Vein: No evidence of thrombus. Normal
compressibility and flow on color Doppler imaging.

Venous Reflux:  None.

Other Findings:  Negative for a Baker's cyst.
IMPRESSION: No evidence of deep venous thrombosis.

## 2017-01-26 DIAGNOSIS — C169 Malignant neoplasm of stomach, unspecified: Secondary | ICD-10-CM | POA: Diagnosis not present

## 2017-01-26 DIAGNOSIS — Z6834 Body mass index (BMI) 34.0-34.9, adult: Secondary | ICD-10-CM | POA: Diagnosis not present

## 2017-01-26 DIAGNOSIS — N183 Chronic kidney disease, stage 3 (moderate): Secondary | ICD-10-CM | POA: Diagnosis not present

## 2017-01-26 DIAGNOSIS — E119 Type 2 diabetes mellitus without complications: Secondary | ICD-10-CM | POA: Diagnosis not present

## 2017-01-27 IMAGING — CT CT ABD-PELV W/ CM
2 of 6 series · 16 of 46 positions shown, 18 images · IV contrast (APPLIED)
Comparison: 09/19/2015, 10/15/2013

CLINICAL DATA: Upper abdominal pain and nausea for 1 week, right
upper quadrant pain

EXAM:
CT ABDOMEN AND PELVIS WITH CONTRAST
TECHNIQUE: Multidetector CT imaging of the abdomen and pelvis was performed
using the standard protocol following bolus administration of
intravenous contrast.
CONTRAST:  100mL [id number] IOPAMIDOL ([id number]) INJECTION 61%

[Series 2: axial st · axial · 0.98mm/px · z∈[+782,+1242]mm · 13 of 108 slices shown, 15 images]
[im 8/108  soft-tissue]
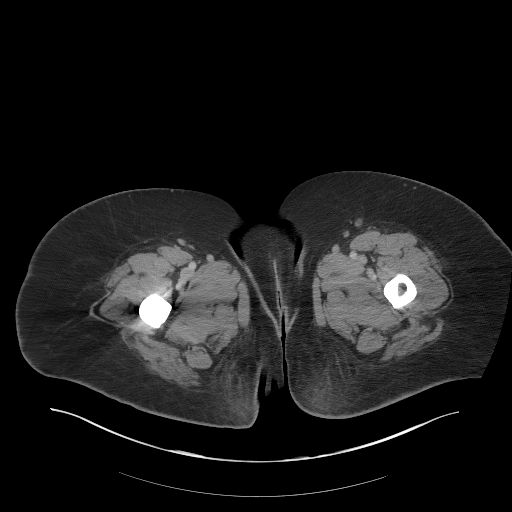
[im 8/108  bone]
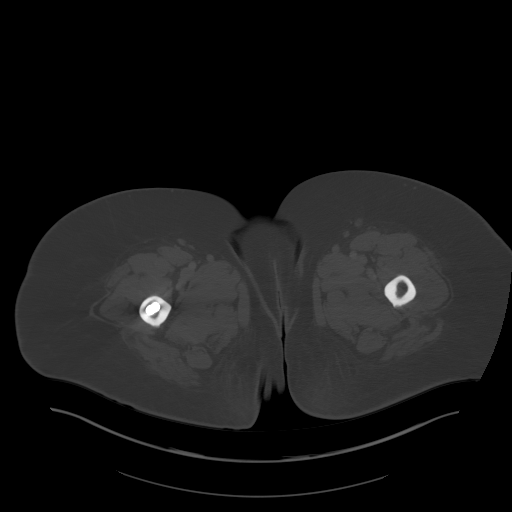
[im 15/108  soft-tissue]
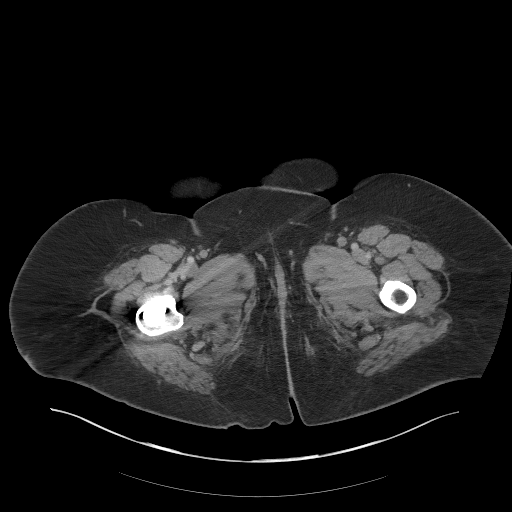
[im 22/108  soft-tissue]
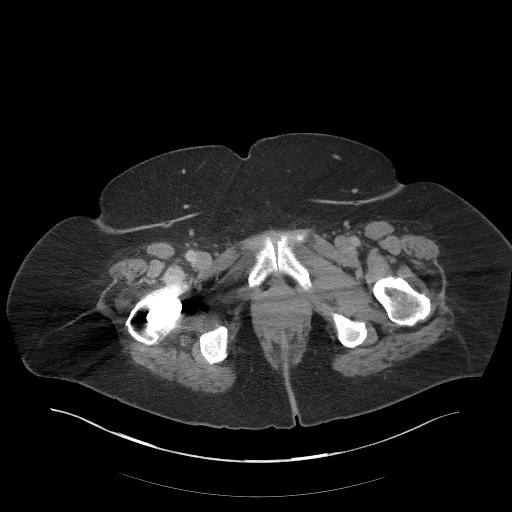
[im 29/108  soft-tissue]
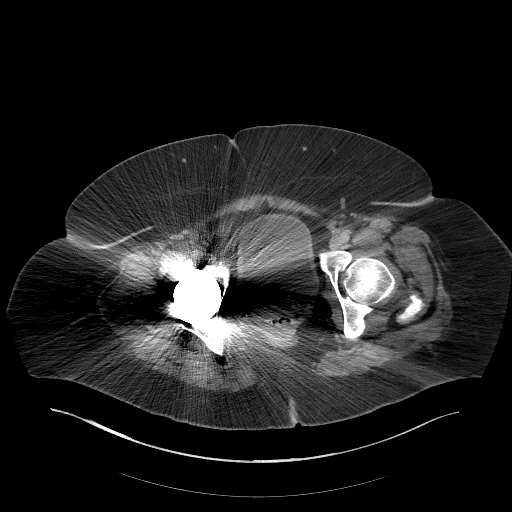
[im 36/108  soft-tissue]
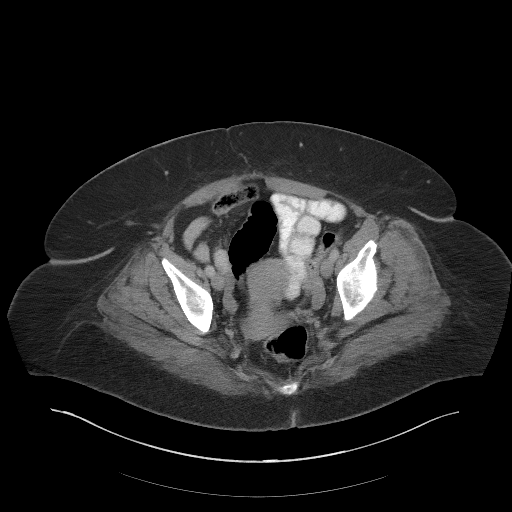
[im 43/108  soft-tissue]
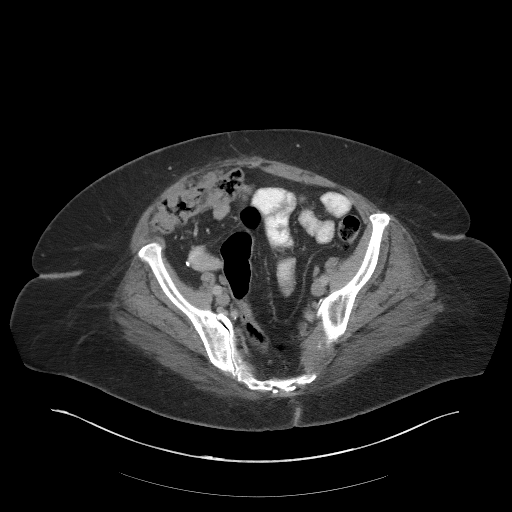
[im 58/108  soft-tissue]
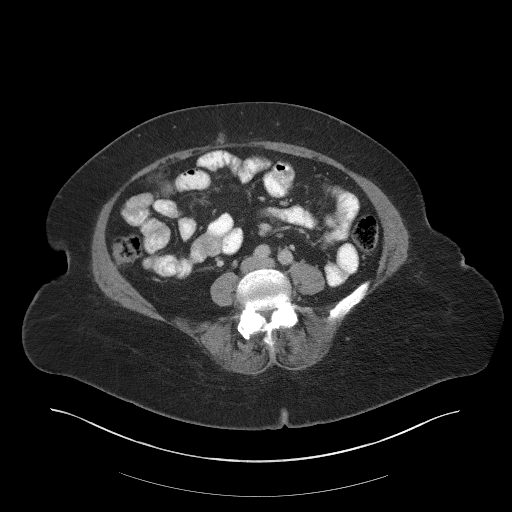
[im 65/108  soft-tissue]
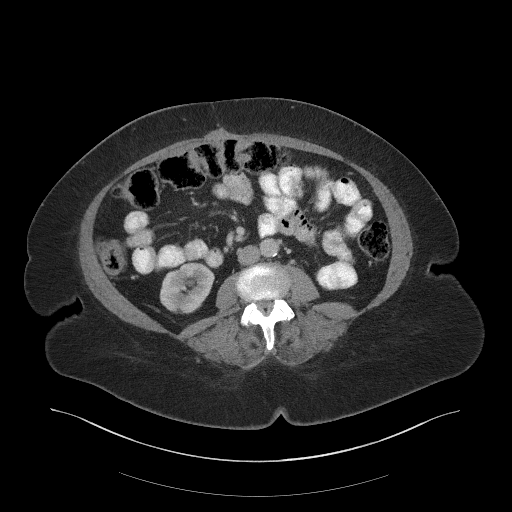
[im 72/108  soft-tissue]
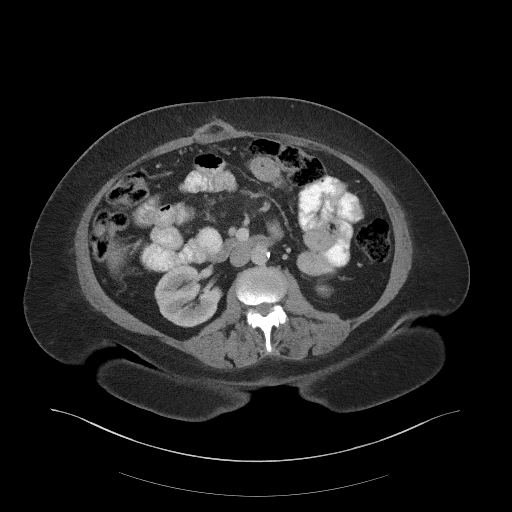
[im 72/108  bone]
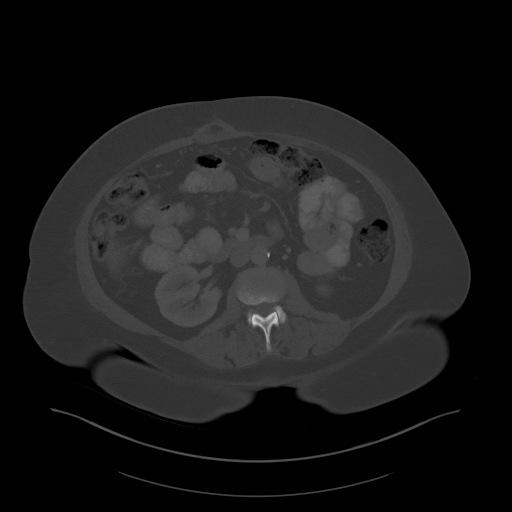
[im 79/108  soft-tissue]
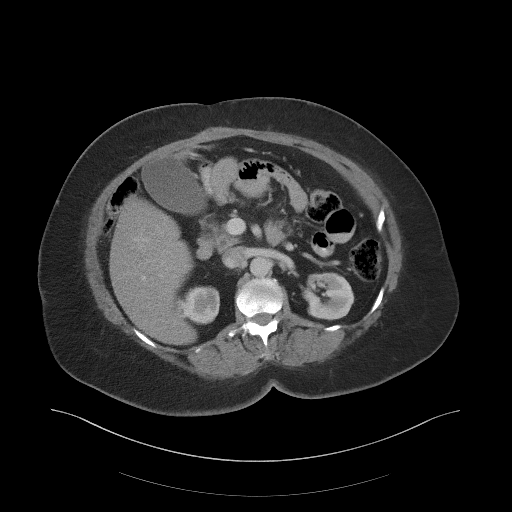
[im 86/108  soft-tissue]
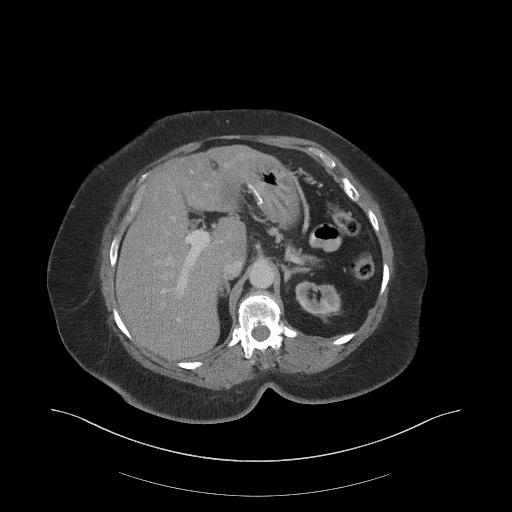
[im 93/108  soft-tissue]
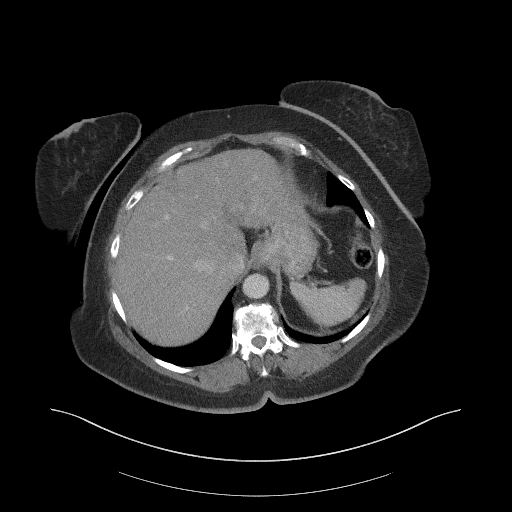
[im 100/108  soft-tissue]
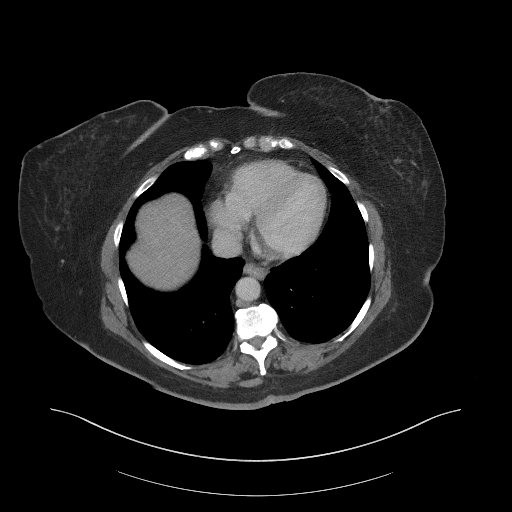

[Series 5: coronal st · coronal · 1.01mm/px · 3 of 125 slices shown]
[im 42/125  soft-tissue]
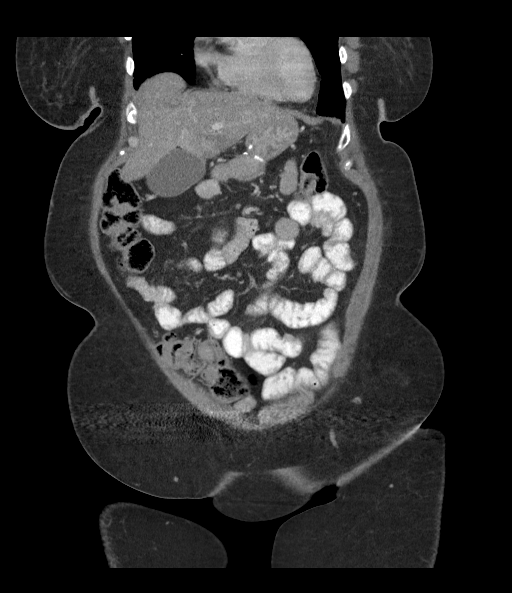
[im 56/125  soft-tissue]
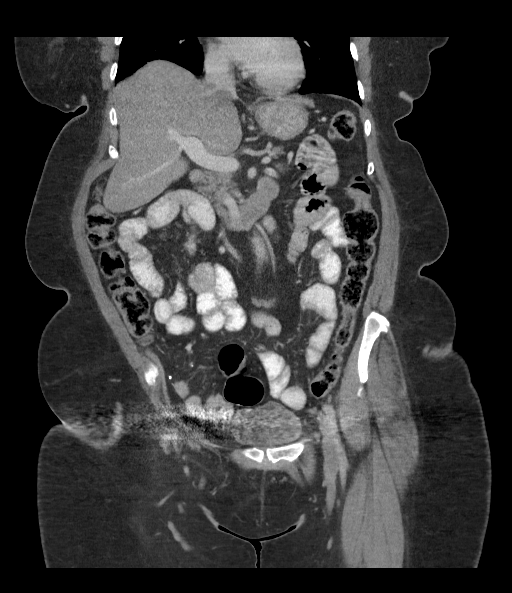
[im 69/125  soft-tissue]
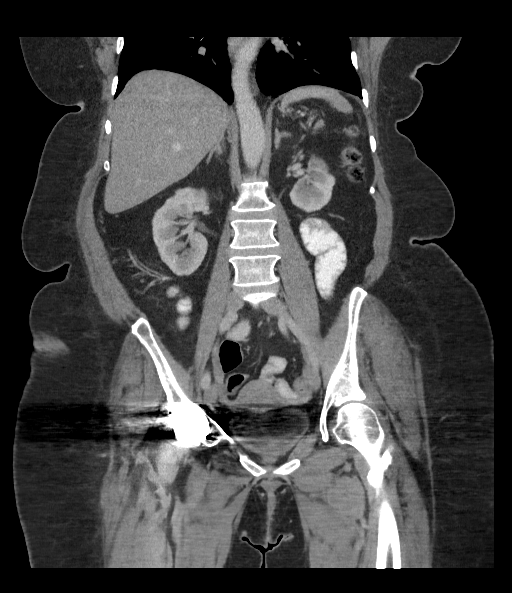

[16 of 46 positions shown; findings below may reference images not displayed]

FINDINGS: Lower chest:  Normal

Hepatobiliary: Moderate hepatic steatosis. More focal
low-attenuation measuring about 2.5 cm at the dome of the liver
anterior to the vena cava, with another similar zone in the lateral
left lobe inferiorly on image number 22. These focal zones were not
previously present. Gallbladder is normal.

Pancreas: Normal

Spleen: Normal

Adrenals/Urinary Tract: Adrenal glands are normal. There is
bilateral renal atrophy, with more pronounced cortical atrophy on
the left as compared to the right. Mild upper pole scarring
bilaterally. No hydronephrosis. Bladder partially obscured by beam
attenuation artifact related to right hip replacement but otherwise
negative.

Stomach/Bowel: Diverticulosis of the sigmoid colon with no evidence
of diverticulitis. Nonobstructive bowel gas pattern. Appendix not
identified. Small bowel normal. Prior partial gastrectomy and
gastrojejunostomy.

Vascular/Lymphatic: Mild aortic calcification. No significant
adenopathy in the abdomen or pelvis.

Reproductive: Partially obscured by beam hardening artifact but
otherwise negative

Other: No ascites identified. Stable deformity right rectus
abdominal musculature measuring about 5 cm just to the right of
midline anteriorly, likely representing postoperative change. This
is stable from prior studies.

Musculoskeletal: There are no acute osseous abnormalities.
IMPRESSION: Superimposed on a background of diffuse hepatic steatosis, there are
2 zones of more prominent low attenuation as described above. These
are new from prior studies. These do not demonstrate evidence of
mass effect and do not disturbed vessels running through the regions
and likely represent areas of more pronounced fatty infiltration. It
is difficult to completely exclude mass and liver MRI would be
helpful to evaluate further.

No other significant interval change when compared to prior studies.

## 2017-02-01 ENCOUNTER — Ambulatory Visit (HOSPITAL_BASED_OUTPATIENT_CLINIC_OR_DEPARTMENT_OTHER): Payer: Medicare HMO

## 2017-02-02 ENCOUNTER — Ambulatory Visit (HOSPITAL_BASED_OUTPATIENT_CLINIC_OR_DEPARTMENT_OTHER): Payer: Medicare HMO

## 2017-02-08 IMAGING — NM NM HEPATO W/GB/PHARM/[PERSON_NAME]
1 series · 12 of 12 positions shown · non-contrast
Comparison: None.

CLINICAL DATA: One month history of right upper quadrant pain with
nausea and vomiting

EXAM:
NUCLEAR MEDICINE HEPATOBILIARY IMAGING WITH GALLBLADDER EF
Views: Anterior right upper quadrant
RADIOPHARMACEUTICALS:  5.1 mCi Dc-VVm  Choletec IV

[Series 1: hepato · 4.46mm/px · 2 acquisitions, 12 frames shown]
[im 1/2]
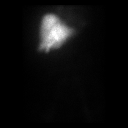
[im 1/2]
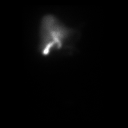
[im 1/2]
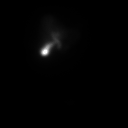
[im 1/2]
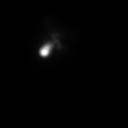
[im 1/2]
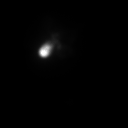
[im 1/2]
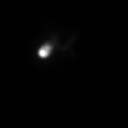
[im 2/2]
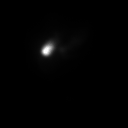
[im 2/2]
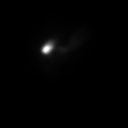
[im 2/2]
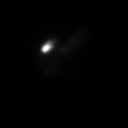
[im 2/2]
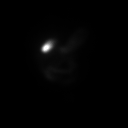
[im 2/2]
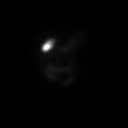
[im 2/2]
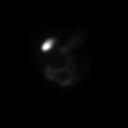

[12 of 12 positions shown; findings below may reference images not displayed]

FINDINGS: Liver uptake of radiotracer is homogeneous and prompt. There is
prompt visualization of gallbladder and small bowel, indicating
patency of the cystic and common bile ducts. The patient consumed 8
ounces of Ensure orally with calculation of the computer generated
ejection fraction of radiotracer the gallbladder. The patient did
not report clinical symptoms with the Ensure consumption. The
computer generated ejection fraction of radiotracer from the
gallbladder is normal at 50.2%, normal greater than 33% using the
oral agent.
IMPRESSION: Study within normal limits.

## 2017-02-21 ENCOUNTER — Ambulatory Visit (HOSPITAL_BASED_OUTPATIENT_CLINIC_OR_DEPARTMENT_OTHER)
Admission: RE | Admit: 2017-02-21 | Discharge: 2017-02-21 | Disposition: A | Discharge status: Home / Self Care | Payer: Medicare HMO | Source: Ambulatory Visit | Attending: Family Medicine | Admitting: Family Medicine

## 2017-02-21 ENCOUNTER — Other Ambulatory Visit: Payer: Self-pay

## 2017-02-21 DIAGNOSIS — E042 Nontoxic multinodular goiter: Secondary | ICD-10-CM | POA: Diagnosis not present

## 2017-02-21 MED ORDER — OMEPRAZOLE 20 MG PO CPDR: 20.0000 mg | DELAYED_RELEASE_CAPSULE | Freq: Every day | ORAL | 0 refills | Status: DC

## 2017-02-21 MED ORDER — SUCRALFATE 1 GM/10ML PO SUSP: 1.0000 g | Freq: Three times a day (TID) | ORAL | 0 refills | Status: DC

## 2017-02-21 MED ORDER — ONDANSETRON 4 MG PO TBDP: 4.0000 mg | ORAL_TABLET | Freq: Three times a day (TID) | ORAL | 0 refills | Status: DC | PRN

## 2017-02-21 MED FILL — CARAFATE 1 GM/10 ML SUSP: 1 | 11 days supply | Qty: 420 | Fill #0

## 2017-02-21 MED FILL — OMEPRAZOLE DR 20 MG CAPSULE: 20 | 14 days supply | Qty: 14 | Fill #0

## 2017-02-22 ENCOUNTER — Ambulatory Visit (INDEPENDENT_AMBULATORY_CARE_PROVIDER_SITE_OTHER): Payer: Medicare HMO | Admitting: Internal Medicine

## 2017-02-22 ENCOUNTER — Encounter: Payer: Self-pay | Admitting: Internal Medicine

## 2017-02-22 VITALS — BP 130/88 | HR 74 | Ht 66.0 in | Wt 216.0 lb

## 2017-02-22 DIAGNOSIS — E042 Nontoxic multinodular goiter: Secondary | ICD-10-CM

## 2017-02-22 DIAGNOSIS — E1159 Type 2 diabetes mellitus with other circulatory complications: Secondary | ICD-10-CM

## 2017-02-22 MED ORDER — METFORMIN HCL 500 MG PO TABS: 500.0000 mg | ORAL_TABLET | Freq: Two times a day (BID) | ORAL | 3 refills | Status: DC

## 2017-02-22 NOTE — Patient Instructions (Signed)
Please continue: - Amaryl 4 mg 2x a day  Please restart: - Metformin 500 mg 2x a day  Please return in 1.5 months.
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
## 2017-02-23 DIAGNOSIS — G620 Drug-induced polyneuropathy: Secondary | ICD-10-CM | POA: Diagnosis not present

## 2017-02-23 DIAGNOSIS — G894 Chronic pain syndrome: Secondary | ICD-10-CM | POA: Diagnosis not present

## 2017-02-23 DIAGNOSIS — E1142 Type 2 diabetes mellitus with diabetic polyneuropathy: Secondary | ICD-10-CM | POA: Diagnosis not present

## 2017-02-23 DIAGNOSIS — R69 Illness, unspecified: Secondary | ICD-10-CM | POA: Diagnosis not present

## 2017-03-04 IMAGING — MR MR ABDOMEN WO/W CM
7 of 19 series · 12 of 48 positions shown · IV contrast (multihance)
Comparison: CT of the abdomen pelvis 10/16/2015. No prior abdominal
MRI.

CLINICAL DATA: 64-year-old female with intermittent abdominal pain
for 1 month starting in the right side of the abdomen and radiating
into the left side with some associated low back pain. No nausea.

EXAM:
MRI ABDOMEN WITHOUT AND WITH CONTRAST
TECHNIQUE: Multiplanar multisequence MR imaging of the abdomen was performed
both before and after the administration of intravenous contrast.
CONTRAST:  10mL MULTIHANCE GADOBENATE DIMEGLUMINE 529 MG/ML IV SOLN

[Series 3: T2 · coronal · 7.0mm · 1.56mm/px · 1 of 36 slices shown (1 of 2)]
[im 1/36]
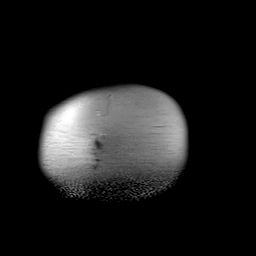

[Series 4: T2 fat-sat · axial · 6.0mm · 1.48mm/px · 1 of 30 slices shown]
[im 1/30]
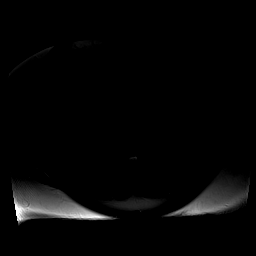

[Series 5: acr-in + out · axial · 6.0mm · 0.74mm/px · z∈[-181,+52]mm · 2 of 64 slices shown]
[im 1/64]
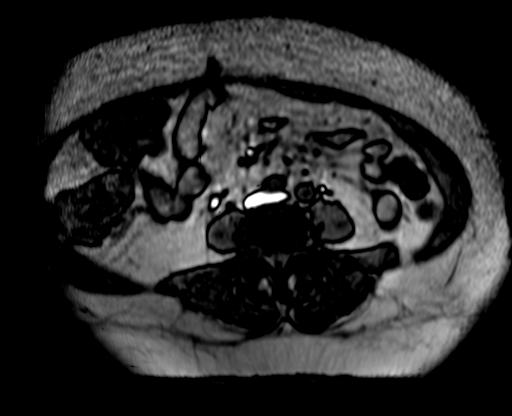
[im 64/64]
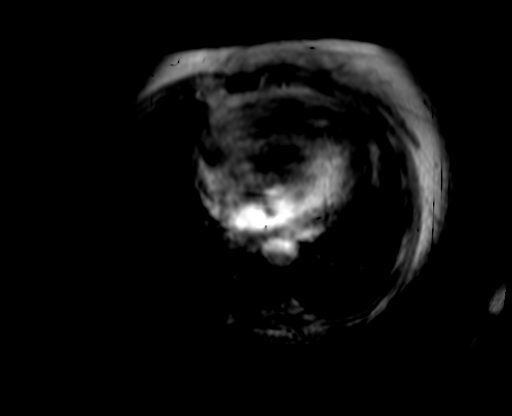

[Series 6: DWI · axial · 6.0mm · 0.74mm/px · z∈[-203,+74]mm · 2 of 38 slices shown]
[im 1/38]
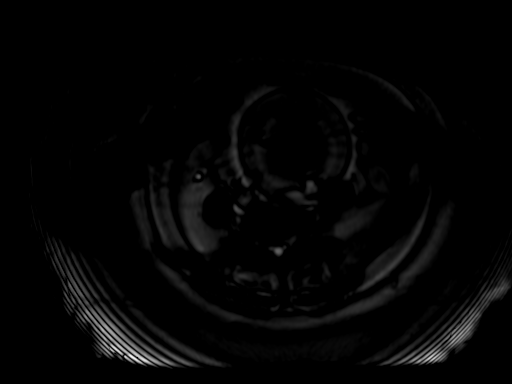
[im 38/38]
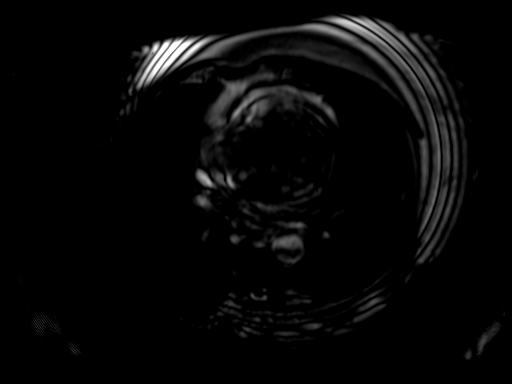

[Series 7: T2 · axial · 6.0mm · 1.48mm/px · 1 of 30 slices shown (2 of 2)]
[im 1/30]
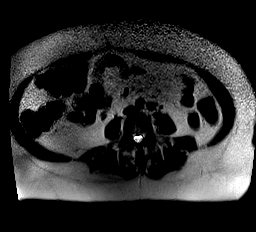

[Series 8: [id number]_500_800 free breathing · axial · 6.0mm · 1.98mm/px · z∈[-126,+91]mm · 4 of 90 slices shown]
[im 1/90]
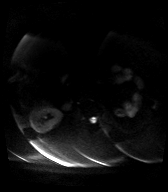
[im 30/90]
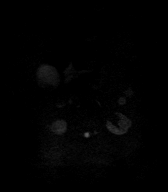
[im 60/90]
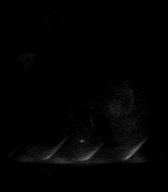
[im 90/90]
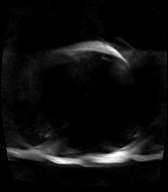

[Series 9: [id number]_800 free breathing_adc · axial · 6.0mm · 1.98mm/px · 1 of 30 slices shown]
[im 1/30]
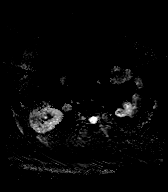

[12 of 48 positions shown; findings below may reference images not displayed]

FINDINGS: Comment: Study is slightly limited by considerable patient
respiratory motion.

Lower chest:  Visualized portions are unremarkable.

Hepatobiliary: There is diffuse low signal intensity throughout the
liver on T2 weighted sequences. Additionally, there is diffuse loss
of signal intensity throughout the liver on in phase dual echo
images, suggesting significant iron deposition in the liver.
Relatively low signal intensity is also noted throughout the liver
on out of phase dual echo images, which is suggestive of concurrent
hepatic steatosis. No suspicious cystic or solid hepatic lesions. No
intra or extrahepatic biliary ductal dilatation on MRCP images.
Common bile duct measures up to 5 mm in the porta hepatis.
Gallbladder is normal in appearance.

Pancreas: Severe pancreatic atrophy. No pancreatic mass. No
pancreatic ductal dilatation. No pancreatic or peripancreatic fluid
or inflammatory changes.

Spleen: Loss of signal intensity in the spleen on in phase dual echo
images and low signal intensity in the spleen on T2 weighted images,
suggestive of splenic iron deposition. Spleen is normal in size.

Adrenals/Urinary Tract: Bilateral adrenal glands and bilateral
kidneys are normal in appearance. No hydroureteronephrosis in the
visualized abdomen.

Stomach/Bowel: Visualized portions are unremarkable.

Vascular/Lymphatic: No aneurysm identified in the visualized
abdomen. No lymphadenopathy noted in the abdomen.

Other: No significant volume of ascites in the visualized peritoneal
cavity.

Musculoskeletal: No aggressive osseous lesions are noted in the
visualized portions of the skeleton.
IMPRESSION: 1. There appears to be a combination of iron deposition and
steatosis in the liver. No suspicious hepatic lesions are noted.
2. Mild iron deposition also noted in the spleen.
3. Pancreatic atrophy.

## 2017-03-06 IMAGING — DX DG SHOULDER 2+V*L*
3 series · 3 of 3 positions shown · non-contrast
Comparison: None.

CLINICAL DATA: Initial encounter for Pt fell [REDACTED] and is having
left lateral rib pain and left shoulder pain

EXAM:
LEFT SHOULDER - 2+ VIEW

[shoulder grashey]
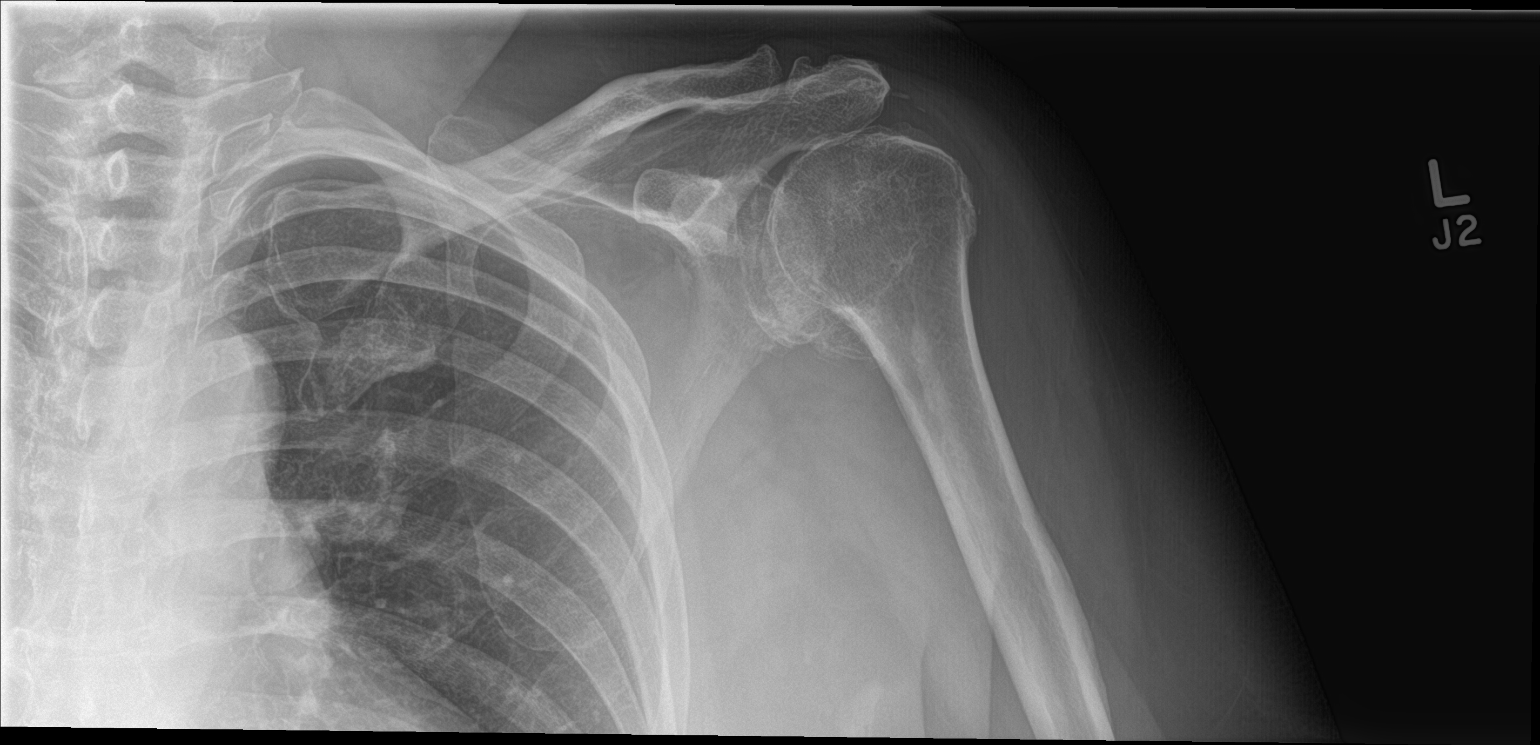

[shoulder y view]
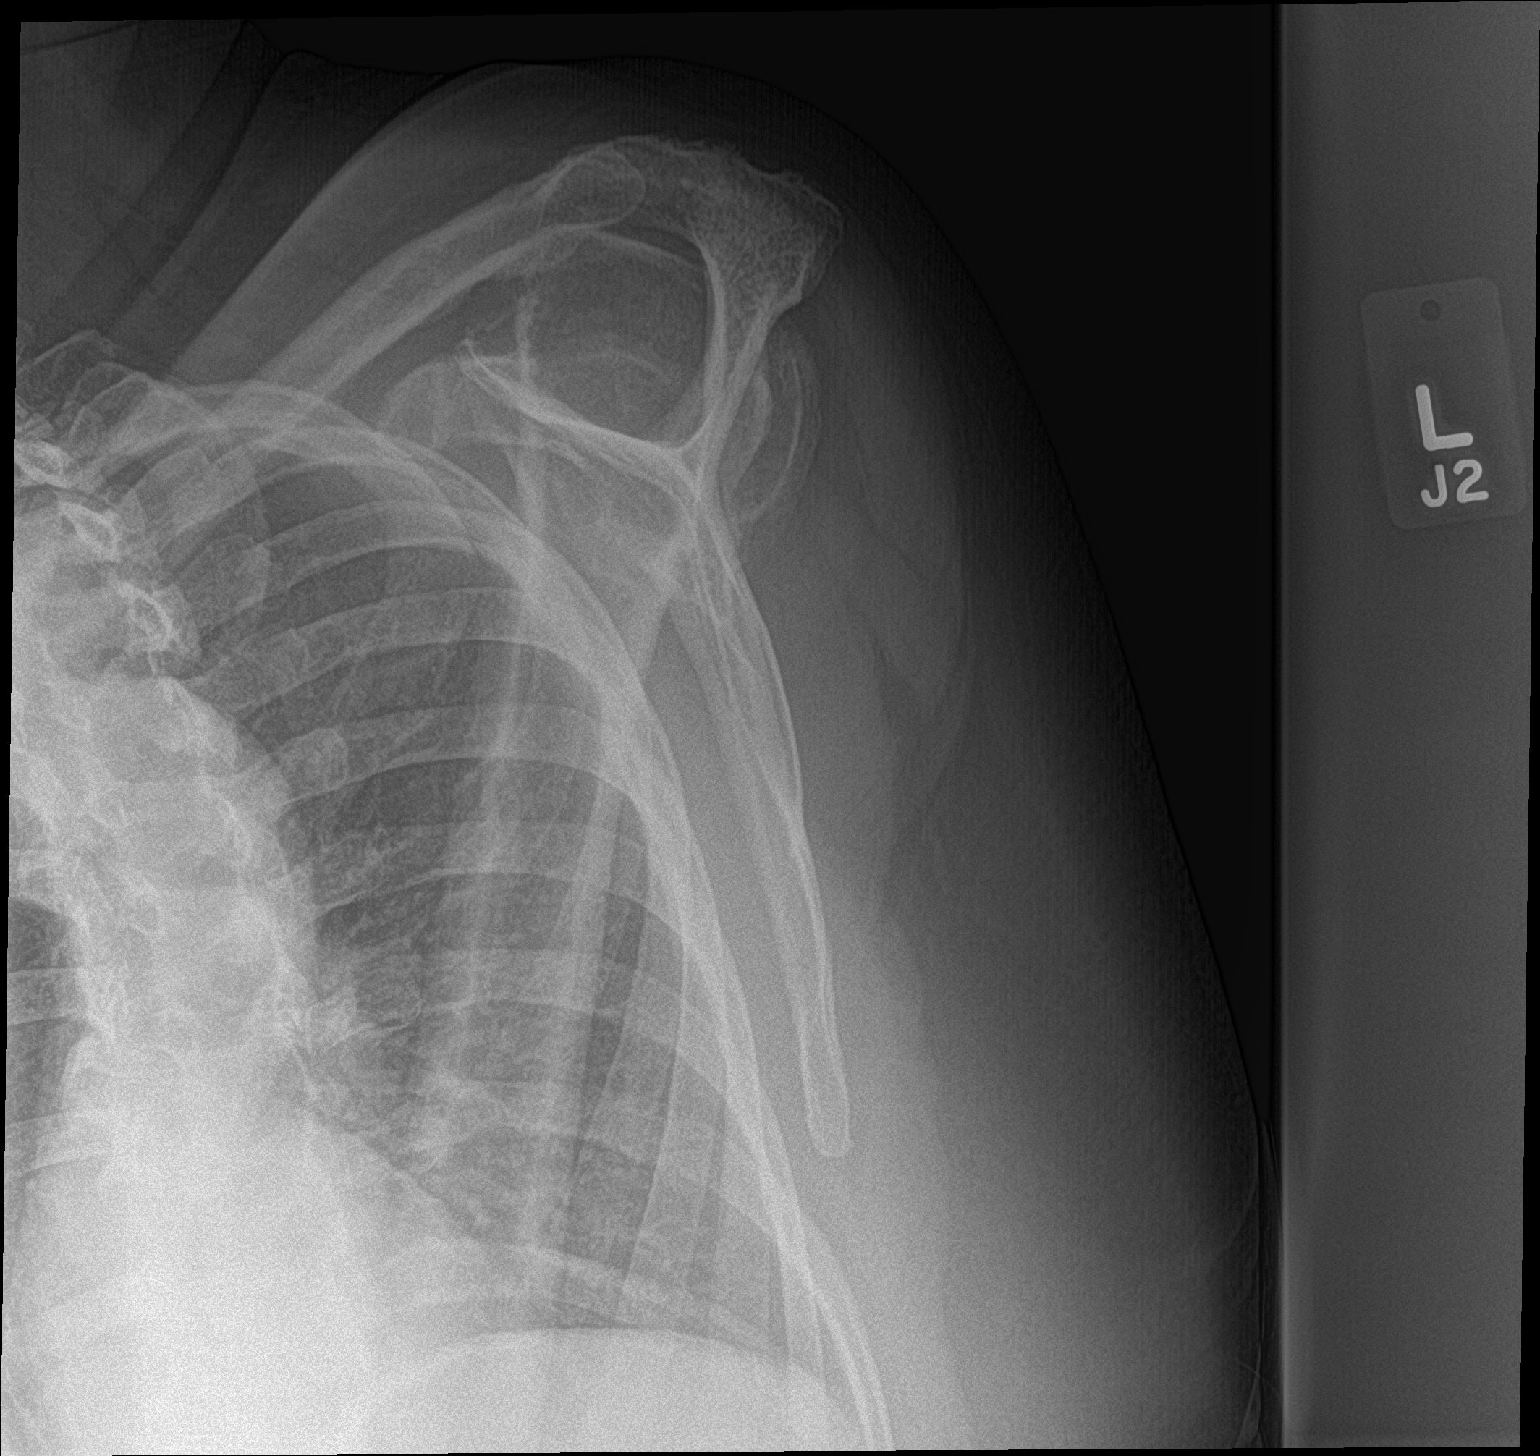

[shoulder axillary]
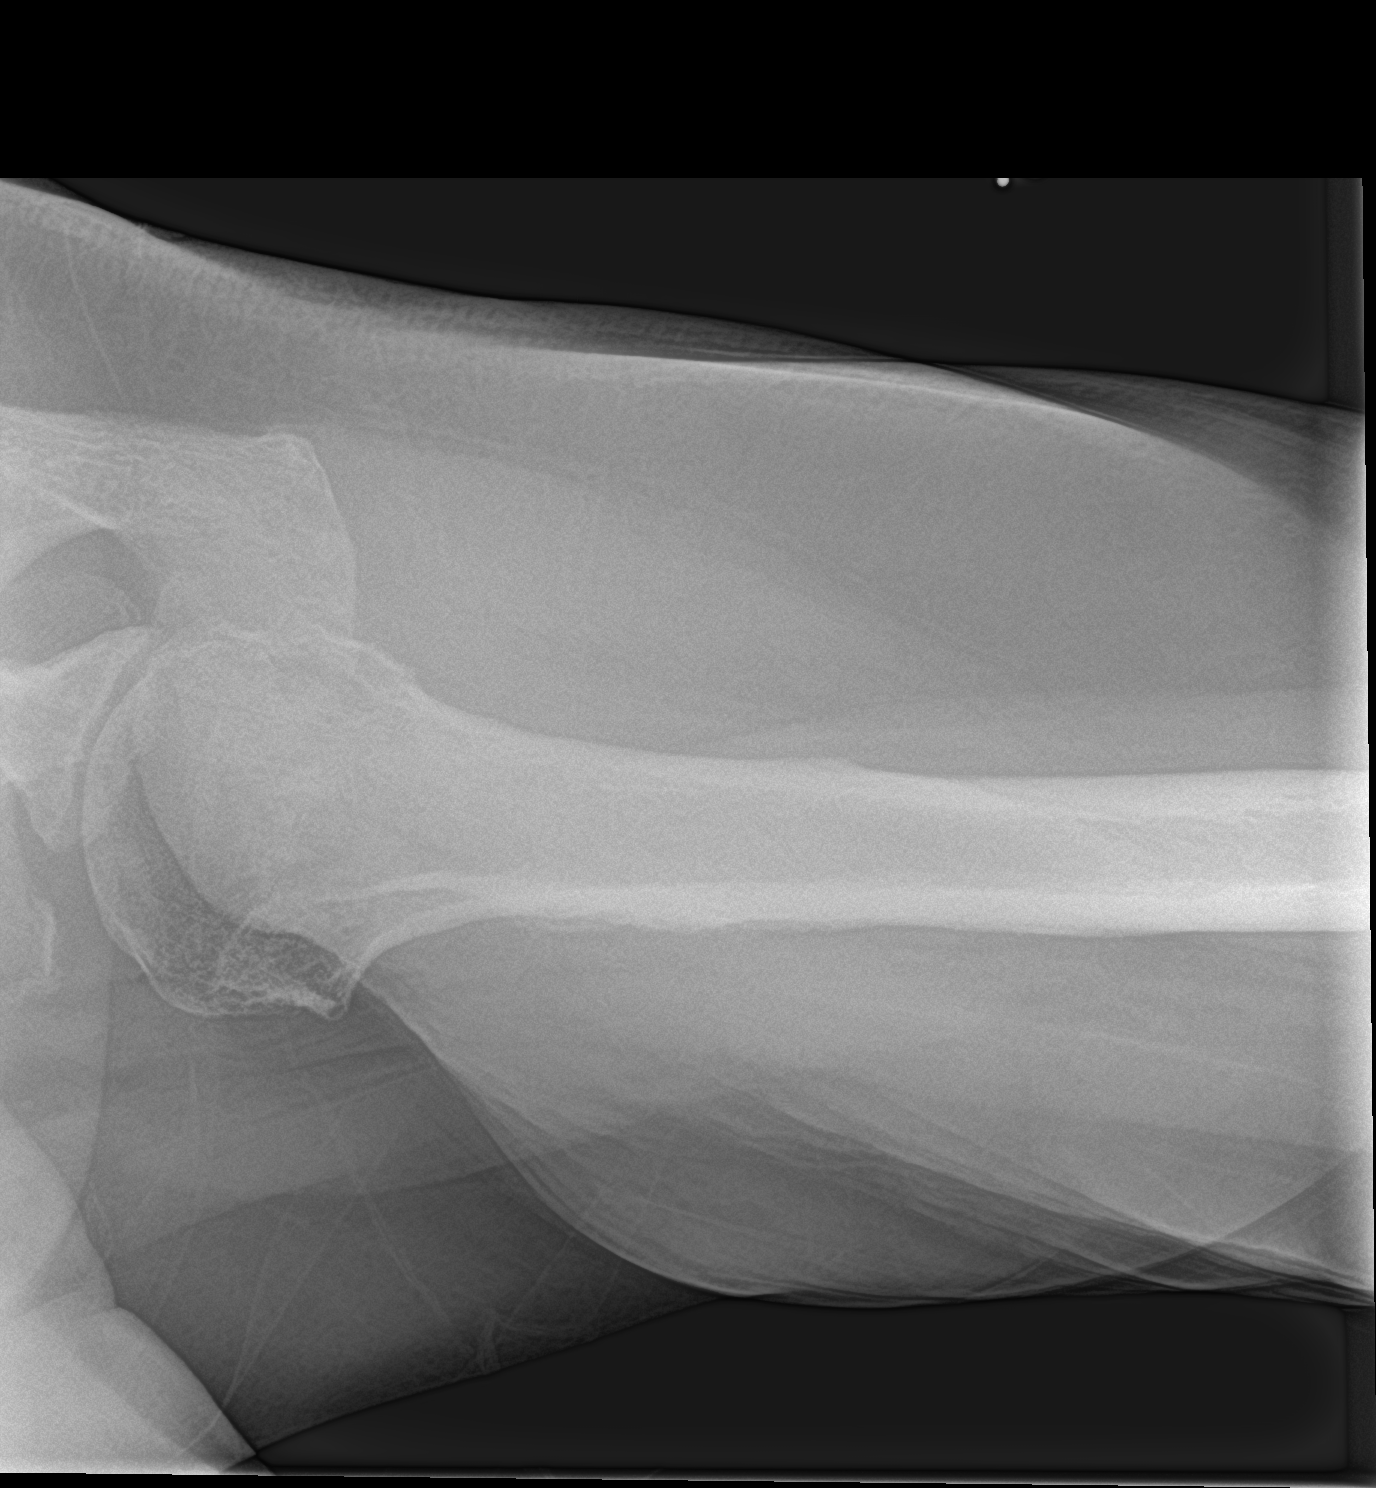

[3 of 3 positions shown; findings below may reference images not displayed]

FINDINGS: Visualized portion of the left hemithorax is normal. Osseous
irregularity about the glenoid and medial aspect of the left humeral
head is favored to be degenerative. There is also mild degenerative
change about the acromioclavicular undersurface. No convincing
evidence of acute fracture or dislocation.
IMPRESSION: Moderate glenohumeral and mild acromioclavicular joint
osteoarthritis. No convincing evidence of acute superimposed
fracture.

## 2017-03-06 IMAGING — DX DG RIBS W/ CHEST 3+V*L*
3 series · 3 of 3 positions shown · non-contrast
Comparison: 09/01/2015

CLINICAL DATA: Fall yesterday with persistent left chest pain,
initial encounter

EXAM:
LEFT RIBS AND CHEST - 3+ VIEW

[chest pa]
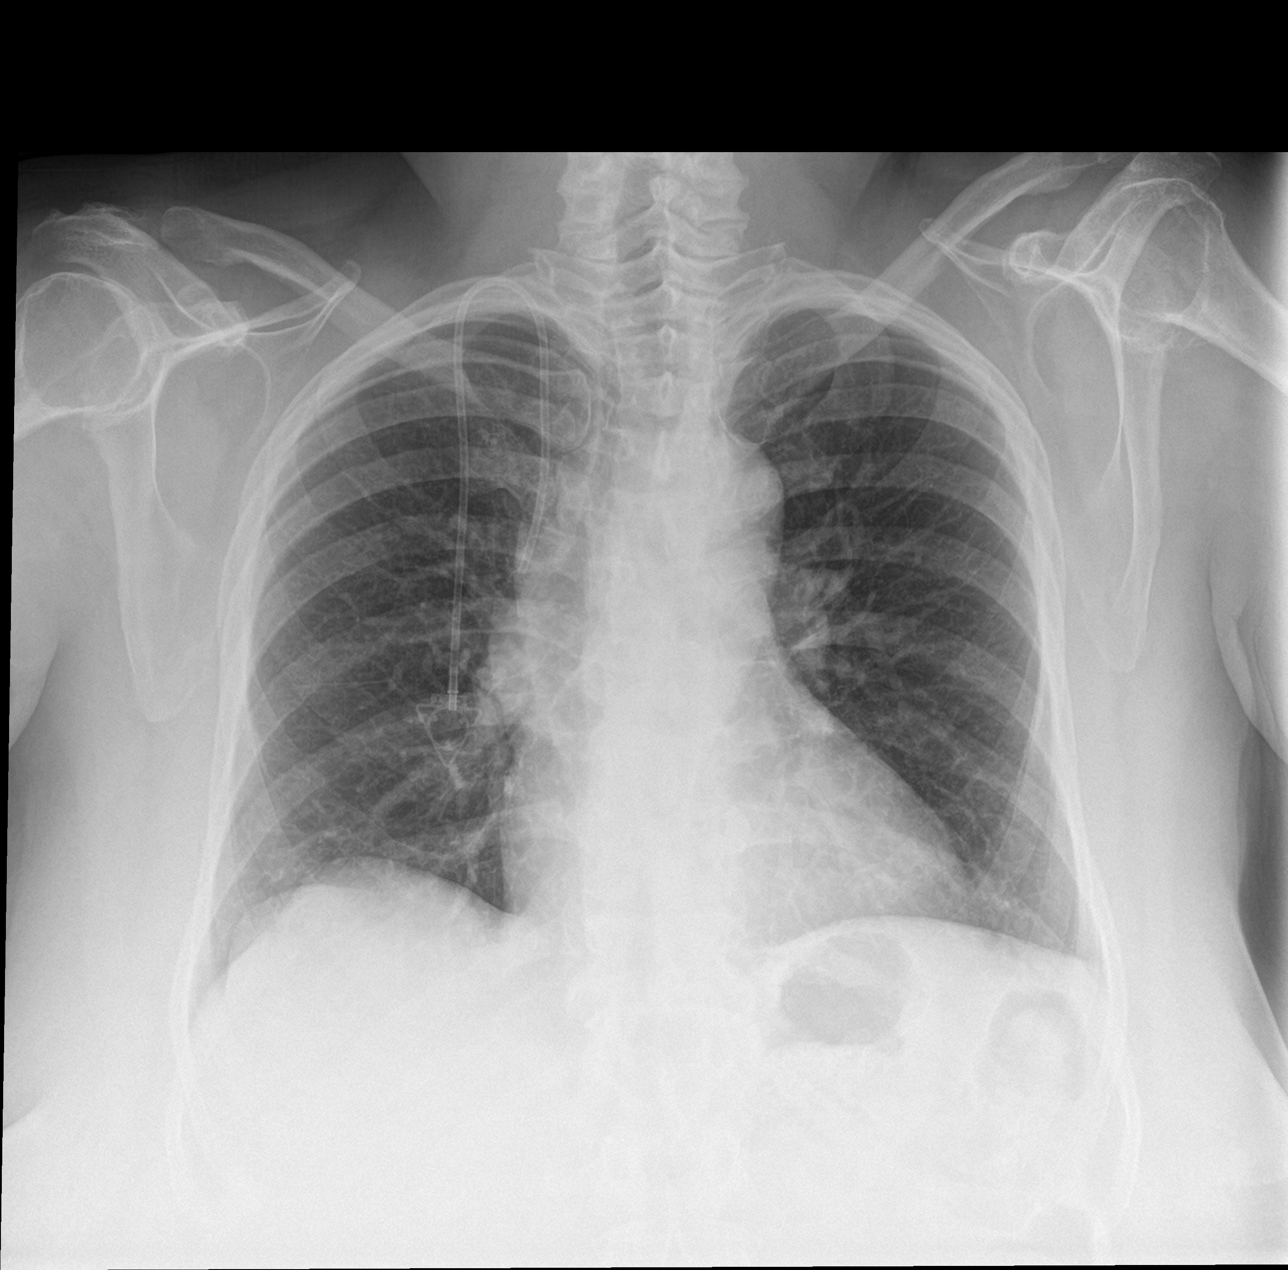

[rib pa obl]
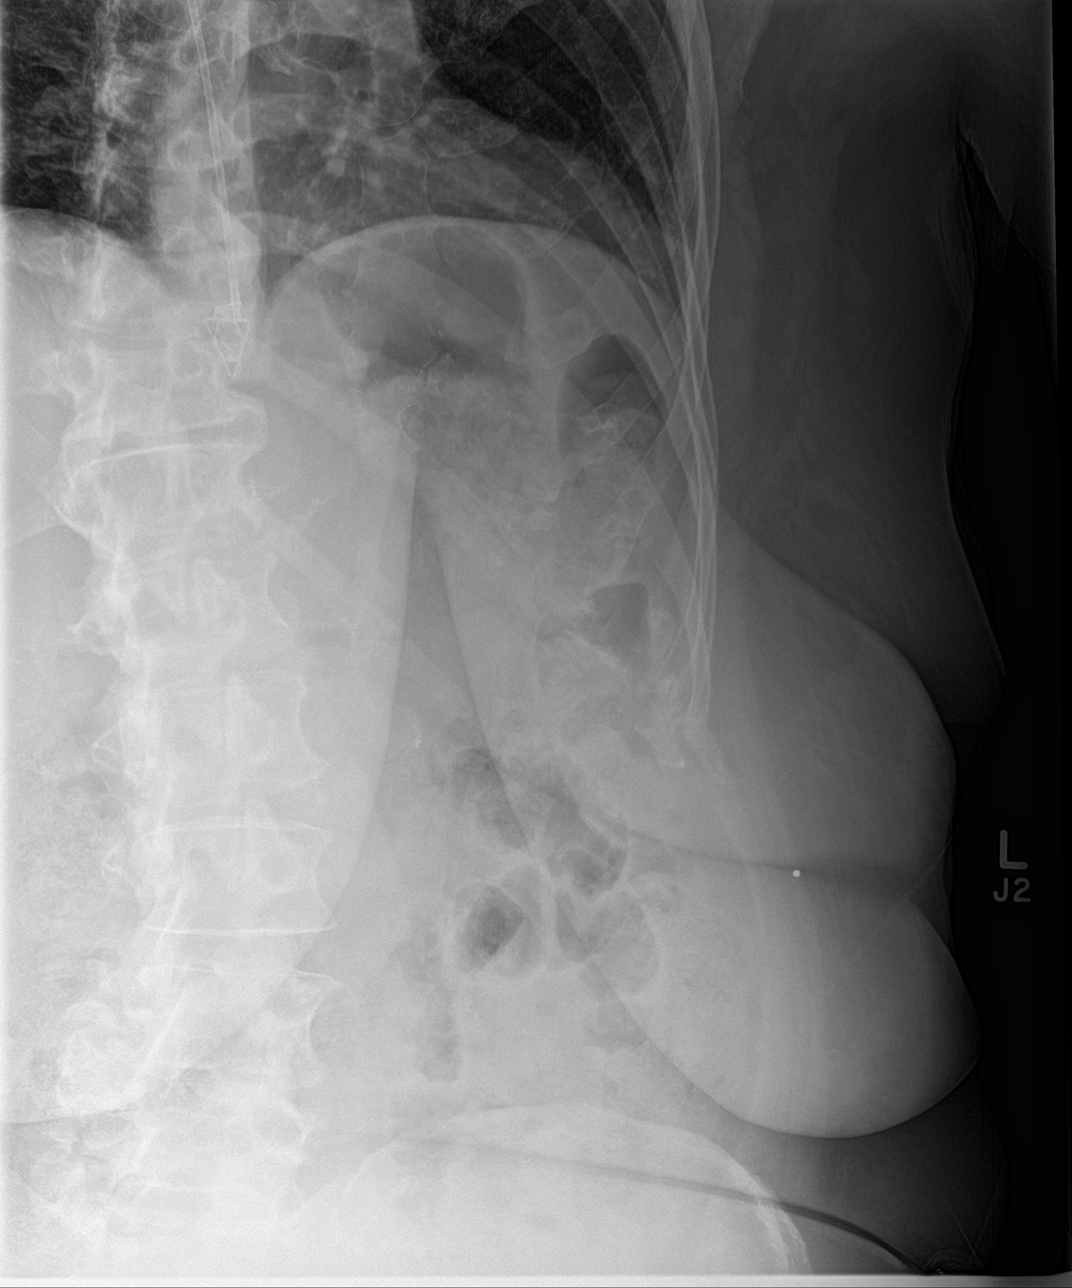

[rib pa]
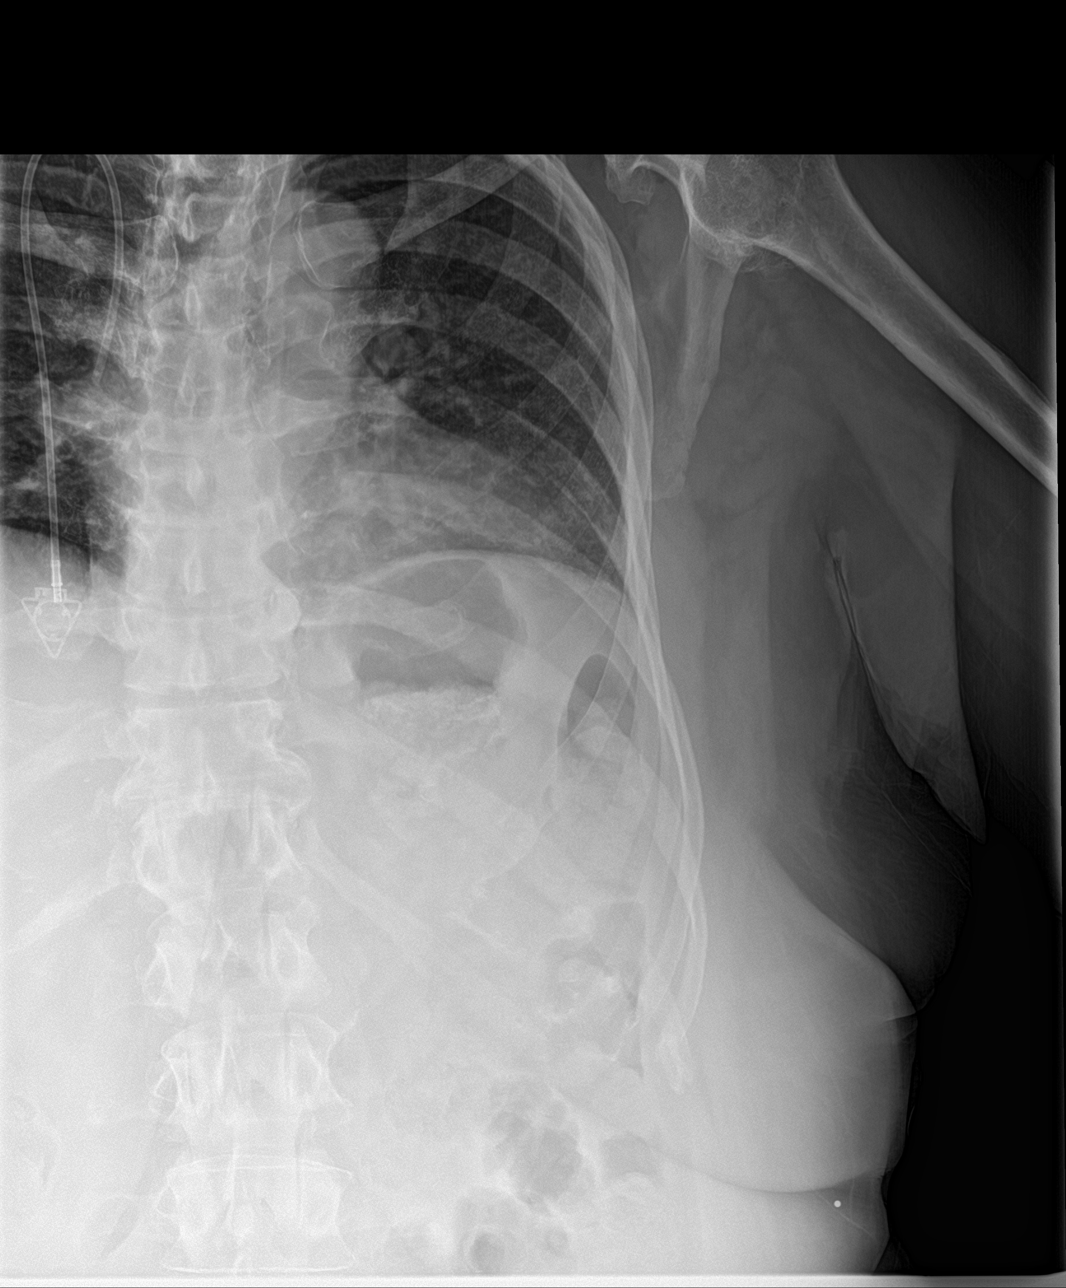

[3 of 3 positions shown; findings below may reference images not displayed]

FINDINGS: Cardiac shadow is stable. Right chest wall port is again seen. Lungs
are well-aerated without focal infiltrate, effusion or pneumothorax.
No definitive rib fracture is noted.
IMPRESSION: No acute rib fracture.

## 2017-03-07 ENCOUNTER — Ambulatory Visit (INDEPENDENT_AMBULATORY_CARE_PROVIDER_SITE_OTHER): Payer: Medicare HMO | Admitting: Internal Medicine

## 2017-03-07 ENCOUNTER — Encounter: Payer: Self-pay | Admitting: Internal Medicine

## 2017-03-07 VITALS — BP 126/84 | HR 107 | Ht 66.0 in | Wt 219.0 lb

## 2017-03-07 DIAGNOSIS — M545 Low back pain, unspecified: Secondary | ICD-10-CM | POA: Insufficient documentation

## 2017-03-07 DIAGNOSIS — M25511 Pain in right shoulder: Secondary | ICD-10-CM | POA: Insufficient documentation

## 2017-03-07 DIAGNOSIS — G8929 Other chronic pain: Secondary | ICD-10-CM

## 2017-03-07 MED ORDER — LIDOCAINE 5 % EX PTCH: 1.0000 | MEDICATED_PATCH | CUTANEOUS | 1 refills | Status: DC

## 2017-03-07 MED ORDER — KETOROLAC TROMETHAMINE 30 MG/ML IJ SOLN
30.0000 mg | Freq: Once | INTRAMUSCULAR | Status: AC
  Administered 2017-03-07: 30 mg via INTRAMUSCULAR

## 2017-03-07 MED ORDER — CYCLOBENZAPRINE HCL 5 MG PO TABS: 5.0000 mg | ORAL_TABLET | Freq: Three times a day (TID) | ORAL | 1 refills | Status: DC | PRN

## 2017-03-07 NOTE — Assessment & Plan Note (Signed)
Suspect msk contusion though no bruising noted, ok for film, for muscle relaxer prn,  to f/u any worsening symptoms or concerns

## 2017-03-07 NOTE — Progress Notes (Signed)
Subjective:    Patient ID: [name], female    DOB: [birth year], 66 y.o.   MRN: [id number]  HPI here with 1 day onset pain to across the right post shoulder and upper arm, and lower back and buttocks with sitting down hard and hit a window sill hard.  States severe, constant, worse to move the sholder or bend at waist.  Sees pain clinic with chronic narcotic use/morphine in addition to gabapentin for neuropathic pain.  Pt denies chest pain, increased sob or doe, wheezing, orthopnea, PND, increased LE swelling, palpitations, dizziness or syncope.   Pt denies polydipsia, polyuria, Pt denies new neurological symptoms such as new headache, or facial or extremity weakness or numbness  No neck pain Past Medical History:  Diagnosis Date  . Asthma   . Degenerative joint disease   . Diabetes mellitus   . Dyspnea   . GERD (gastroesophageal reflux disease)   . Hyperlipidemia    no longer on medication for this  . Hypertension    patient denies  . Neuropathy    bilateral hands and feet  . Sickle cell trait ([place])   . Sleep apnea   . stomach ca dx'd 2010   chemo/xrt comp 05/2009  . TIA (transient ischemic attack) 07/2015   Past Surgical History:  Procedure Laterality Date  . APPENDECTOMY    . CESAREAN SECTION    . COLON SURGERY     colonscopy  . COLONOSCOPY WITH PROPOFOL N/A 06/01/2016   Procedure: COLONOSCOPY WITH PROPOFOL;  Surgeon: Wilford Corner, MD;  Location: [facility] ENDOSCOPY;  Service: Endoscopy;  Laterality: N/A;  . ESOPHAGOGASTRODUODENOSCOPY N/A 10/15/2012   Procedure: ESOPHAGOGASTRODUODENOSCOPY (EGD);  Surgeon: Lear Ng, MD;  Location: [facility] ENDOSCOPY;  Service: Endoscopy;  Laterality: N/A;  . ESOPHAGOGASTRODUODENOSCOPY N/A 01/15/2016   Procedure: ESOPHAGOGASTRODUODENOSCOPY (EGD);  Surgeon: Ronald Lobo, MD;  Location: [facility] ENDOSCOPY;  Service: Endoscopy;  Laterality: N/A;  . HERNIA REPAIR    . SHOULDER SURGERY     Left  . TOTAL HIP ARTHROPLASTY     Right  . TOTAL KNEE  ARTHROPLASTY     Left    reports that she quit smoking about 28 years ago. Her smoking use included Cigarettes. She started smoking about 43 years ago. She has a 15.00 pack-year smoking history. She has never used smokeless tobacco. She reports that she does not drink alcohol or use drugs. family history includes Alzheimer's disease (age of onset: 29) in her father; Cancer in her maternal grandfather; Cancer (age of onset: 34) in her mother; Diabetes in her brother and maternal grandmother. Allergies  Allergen Reactions  . Adhesive [Tape] Other (See Comments)    Burn Skin  . Codeine Other (See Comments)    paranoid  . Zocor [Simvastatin - High Dose] Other (See Comments)    Muscle spasms  . Penicillins Rash    Has patient had a PCN reaction causing immediate rash, facial/tongue/throat swelling, SOB or lightheadedness with hypotension: Yes Has patient had a PCN reaction causing severe rash involving mucus membranes or skin necrosis: No Has patient had a PCN reaction that required hospitalization does not remember.  Has patient had a PCN reaction occurring within the last 10 years: No If all of the above answers are "NO", then may proceed with Cephalosporin use.    Current Outpatient Prescriptions on File Prior to Visit  Medication Sig Dispense Refill  . aspirin 81 MG tablet Take 81 mg by mouth at bedtime.    . Blood Glucose Calibration (  OT ULTRA/FASTTK CNTRL SOLN) SOLN     . Blood Glucose Monitoring Suppl (ONE TOUCH ULTRA 2) w/Device KIT     . Blood Pressure Monitoring (B-D ASSURE BPM/MANUAL ARM CUFF) MISC Check blood pressure daily. 1 each 0  . DULoxetine (CYMBALTA) 60 MG capsule Take 1 capsule (60 mg total) by mouth 2 (two) times daily. 180 capsule 4  . EMBEDA 50-2 MG CPCR Take 1 capsule by mouth daily.    Marland Kitchen gabapentin (NEURONTIN) 400 MG capsule Take 2 capsules (800 mg total) by mouth 3 (three) times daily. 180 capsule 8  . glimepiride (AMARYL) 4 MG tablet Take 1 tablet (4 mg total) by  mouth 2 (two) times daily. 60 tablet 5  . metFORMIN (GLUCOPHAGE) 500 MG tablet Take 1 tablet (500 mg total) by mouth 2 (two) times daily with a meal. 180 tablet 3  . Multiple Vitamin (MULTIVITAMIN WITH MINERALS) TABS tablet Take 1 tablet by mouth daily.    Marland Kitchen omeprazole (PRILOSEC) 20 MG capsule Take 1 capsule (20 mg total) by mouth daily. 14 capsule 0  . ondansetron (ZOFRAN ODT) 4 MG disintegrating tablet Take 1 tablet (4 mg total) by mouth every 8 (eight) hours as needed for nausea or vomiting. 20 tablet 0  . ONE TOUCH ULTRA TEST test strip     . sucralfate (CARAFATE) 1 GM/10ML suspension Take 10 mLs (1 g total) by mouth 4 (four) times daily -  with meals and at bedtime. 420 mL 0  . traMADol (ULTRAM) 50 MG tablet Take 1 tablet (50 mg total) by mouth 4 (four) times daily. 120 tablet 1   Current Facility-Administered Medications on File Prior to Visit  Medication Dose Route Frequency Provider Last Rate Last Dose  . sodium chloride flush (NS) 0.9 % injection 10 mL  10 mL Intravenous PRN [place], Sarah M, NP   10 mL at 09/13/16 1146   Review of Systems  Constitutional: Negative for other unusual diaphoresis or sweats HENT: Negative for ear discharge or swelling Eyes: Negative for other worsening visual disturbances Respiratory: Negative for stridor or other swelling  Gastrointestinal: Negative for worsening distension or other blood Genitourinary: Negative for retention or other urinary change Musculoskeletal: Negative for other MSK pain or swelling Skin: Negative for color change or other new lesions Neurological: Negative for worsening tremors and other numbness  Psychiatric/Behavioral: Negative for worsening agitation or other fatigue All other system neg per pt    Objective:   Physical Exam BP 126/84   Pulse (!) 107   Ht 5' 6"  (1.676 m)   Wt 219 lb (99.3 kg)   SpO2 98%   BMI 35.35 kg/m  VS noted, obese, walking stiffly and standing up slowly from sitting Constitutional: Pt  appears in NAD HENT: Head: NCAT.  Right Ear: External ear normal.  Left Ear: External ear normal.  Eyes: . Pupils are equal, round, and reactive to light. Conjunctivae and EOM are normal Nose: without d/c or deformity Neck: Neck supple. Gross normal ROM Cardiovascular: Normal rate and regular rhythm.   Pulmonary/Chest: Effort normal and breath sounds without rales or wheezing.  Abd:  Soft, NT, ND, + BS, no organomegaly Spine tender midline low lumbar and sacral areas without appreciable swelling or rash Right shoulder with tender post aspect and reduced ROM Neurological: Pt is alert. At baseline orientation, motor grossly intact Skin: Skin is warm. No rashes, other new lesions, no LE edema Psychiatric: Pt behavior is normal without agitation  No other exam findings Lab Results  Component  Value Date   WBC 8.3 12/30/2016   HGB 13.1 12/30/2016   HCT 38.3 12/30/2016   PLT 344 12/30/2016   GLUCOSE 201 (H) 12/30/2016   CHOL 149 12/27/2016   TRIG 93.0 12/27/2016   HDL 56.80 12/27/2016   LDLCALC 73 12/27/2016   ALT 21 12/30/2016   AST 23 12/30/2016   NA 142 12/30/2016   K 3.7 12/30/2016   CL 109 12/30/2016   CREATININE 1.31 (H) 12/30/2016   BUN 16 12/30/2016   CO2 22 12/30/2016   TSH 3.061 05/13/2016   INR 0.98 11/05/2010   HGBA1C 8.8 (H) 12/27/2016   MICROALBUR 0.7 06/21/2016          Assessment & Plan:

## 2017-03-07 NOTE — Assessment & Plan Note (Signed)
No narcotic given today, to f/u pain management as she has planned

## 2017-03-07 NOTE — Patient Instructions (Addendum)
You had the anti-inflammatory shot today (toradol)  Please take all new medication as prescribed - the muscle relaxer as needed, and the lidoderm patch for pain  Please continue all other medications as before, and refills have been done if requested.  Please have the pharmacy call with any other refills you may need.  Please continue your efforts at being more active, low cholesterol diet, and weight control.  You are otherwise up to date with prevention measures today.  Please keep your appointments with your specialists as you may have planned  Please go to the XRAY Department at [facility] for the x-ray testing at your convenience

## 2017-03-07 NOTE — Assessment & Plan Note (Signed)
Suspect MSK contusion though no swelling or bruising; has signifcicant tender, for toradol 30 IM x 1, and lidoderm asd

## 2017-03-23 DIAGNOSIS — R69 Illness, unspecified: Secondary | ICD-10-CM | POA: Diagnosis not present

## 2017-03-23 DIAGNOSIS — E1142 Type 2 diabetes mellitus with diabetic polyneuropathy: Secondary | ICD-10-CM | POA: Diagnosis not present

## 2017-03-23 DIAGNOSIS — G894 Chronic pain syndrome: Secondary | ICD-10-CM | POA: Diagnosis not present

## 2017-03-23 DIAGNOSIS — G620 Drug-induced polyneuropathy: Secondary | ICD-10-CM | POA: Diagnosis not present

## 2017-04-07 ENCOUNTER — Telehealth: Payer: Self-pay | Admitting: Family Medicine

## 2017-04-07 DIAGNOSIS — G620 Drug-induced polyneuropathy: Secondary | ICD-10-CM | POA: Diagnosis not present

## 2017-04-07 DIAGNOSIS — E1142 Type 2 diabetes mellitus with diabetic polyneuropathy: Secondary | ICD-10-CM | POA: Diagnosis not present

## 2017-04-07 DIAGNOSIS — G894 Chronic pain syndrome: Secondary | ICD-10-CM | POA: Diagnosis not present

## 2017-04-07 DIAGNOSIS — R69 Illness, unspecified: Secondary | ICD-10-CM | POA: Diagnosis not present

## 2017-04-07 NOTE — Telephone Encounter (Signed)
Self.   Refill for inhaler  Ph[facility]: [facility] [address]

## 2017-04-10 NOTE — Telephone Encounter (Signed)
There is no inhaler on her med list will need to call and verify

## 2017-04-11 MED ORDER — ALBUTEROL SULFATE HFA 108 (90 BASE) MCG/ACT IN AERS: 2.0000 | INHALATION_SPRAY | Freq: Four times a day (QID) | RESPIRATORY_TRACT | 2 refills | Status: DC | PRN

## 2017-04-11 NOTE — Telephone Encounter (Signed)
Dr. Lesia Hausen with pt and informed her that there is not an inhaler currently on her med list. She states she has a rescue inhaler that was originally prescribed by Dr Montez Morita but she states she no longer sees this physician as to why she wanted to know if you could fill it. She states she is going out of town for a month and does not want to go without. Looked back in her medication history, I see albuterol(proventil) from 2016 from Dr. Coralyn Mark

## 2017-04-11 NOTE — Telephone Encounter (Signed)
Ok to send a albuterol inhaler 1 inhaler 2 puffs q6h prn  2 refills

## 2017-04-11 NOTE — Telephone Encounter (Signed)
Rx sent. Will call pt

## 2017-04-14 NOTE — Telephone Encounter (Signed)
Attempted to reach pt unable to leave a vm

## 2017-04-14 NOTE — Telephone Encounter (Signed)
Received fax from pharmacy stating proventil is not covered and the covered alternatives are Ventolin hfa or xopenex. Showed paper for [name] and she circled for Ventolin and verbal to keep same instructions and qty and refills. Form was faxed back to pharmacy

## 2017-04-17 IMAGING — CT CT ABD-PELV W/ CM
2 of 7 series · 16 of 46 positions shown, 18 images · IV contrast (APPLIED)
Comparison: 10/16/2015

CLINICAL DATA: Periumbilical pain for 1 week, history of gastric
cancer

EXAM:
CT ABDOMEN AND PELVIS WITH CONTRAST
TECHNIQUE: Multidetector CT imaging of the abdomen and pelvis was performed
using the standard protocol following bolus administration of
intravenous contrast.
CONTRAST:  80mL N9FVGZ-X00 IOPAMIDOL (N9FVGZ-X00) INJECTION 61%

[Series 4: thins · axial · 0.98mm/px · z∈[-565,-77]mm · 13 of 1059 slices shown, 15 images]
[im 41/1059  soft-tissue]
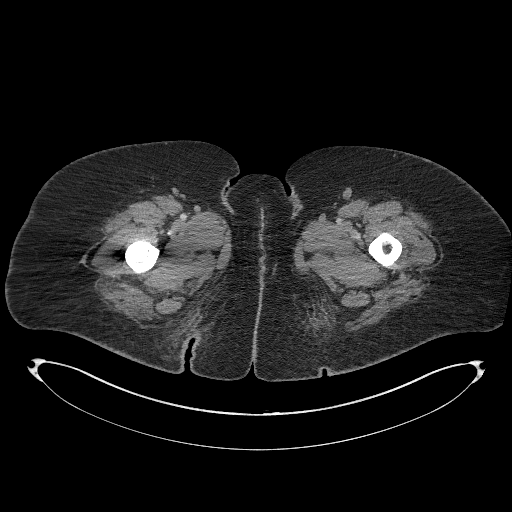
[im 41/1059  bone]
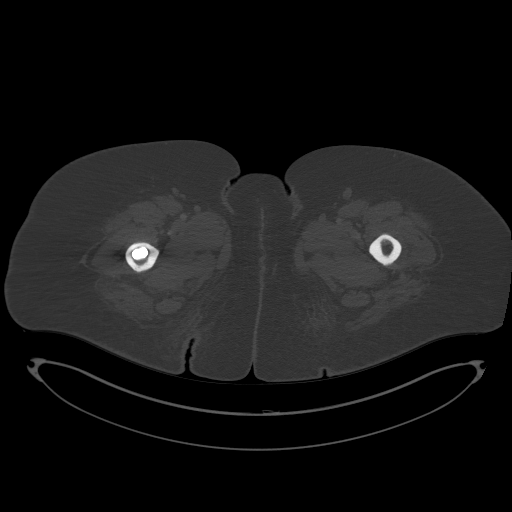
[im 123/1059  soft-tissue]
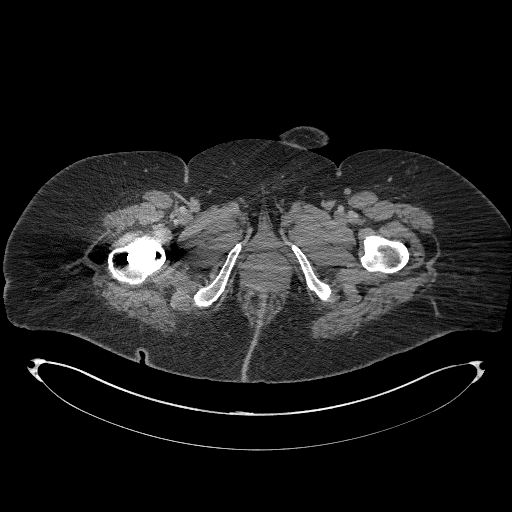
[im 204/1059  soft-tissue]
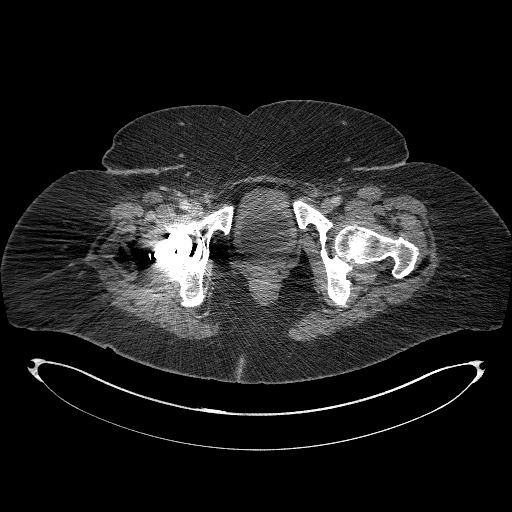
[im 285/1059  soft-tissue]
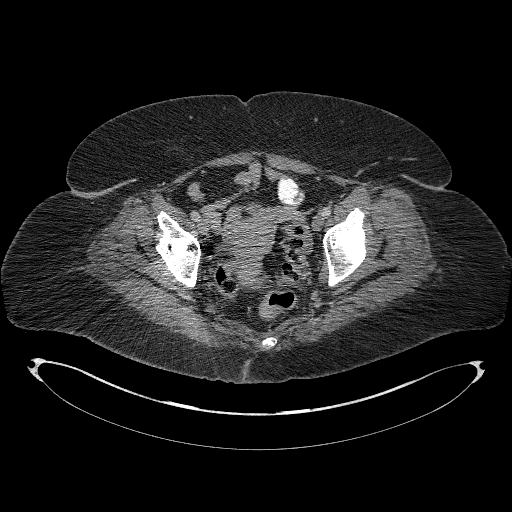
[im 367/1059  soft-tissue]
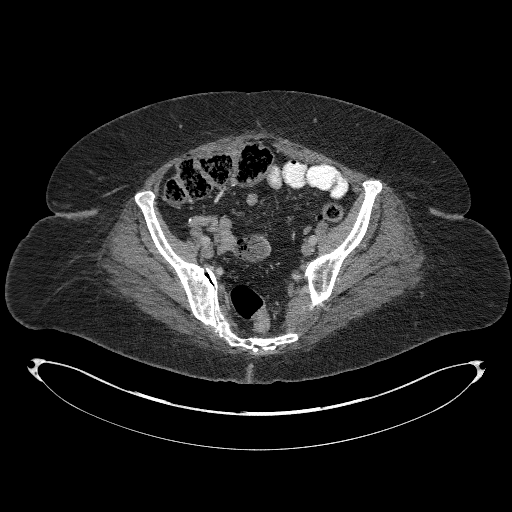
[im 448/1059  soft-tissue]
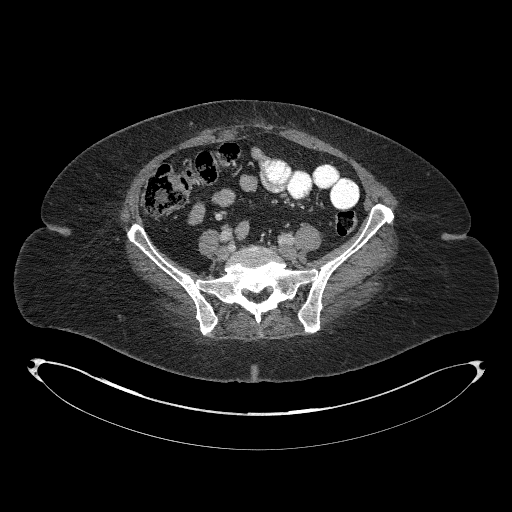
[im 530/1059  soft-tissue]
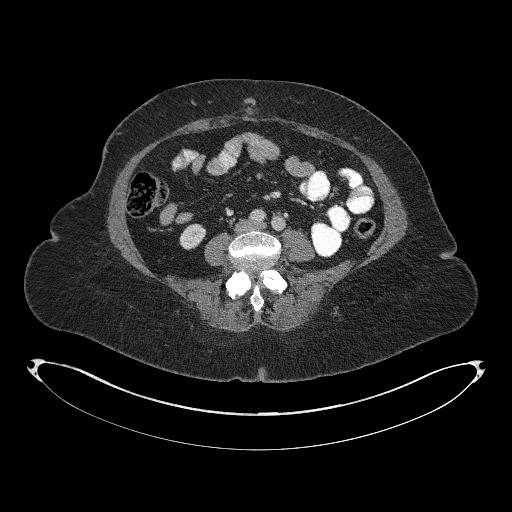
[im 611/1059  soft-tissue]
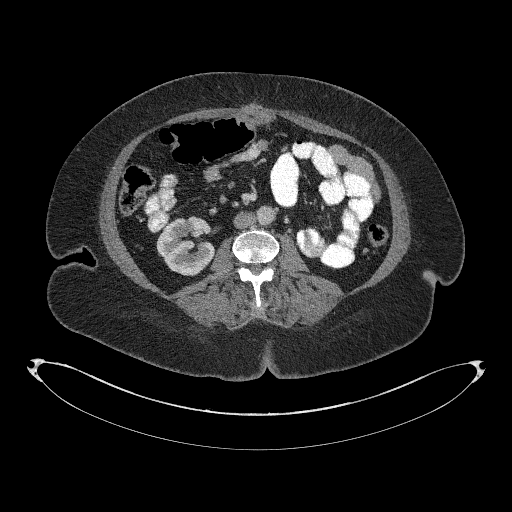
[im 692/1059  soft-tissue]
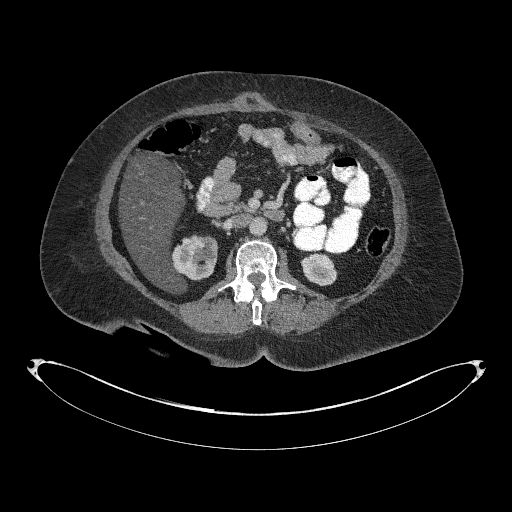
[im 692/1059  bone]
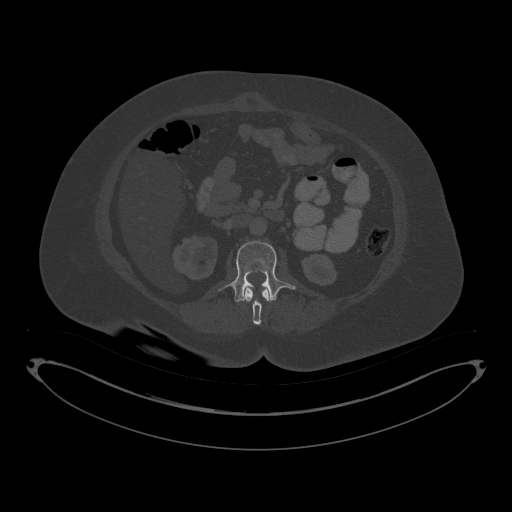
[im 774/1059  soft-tissue]
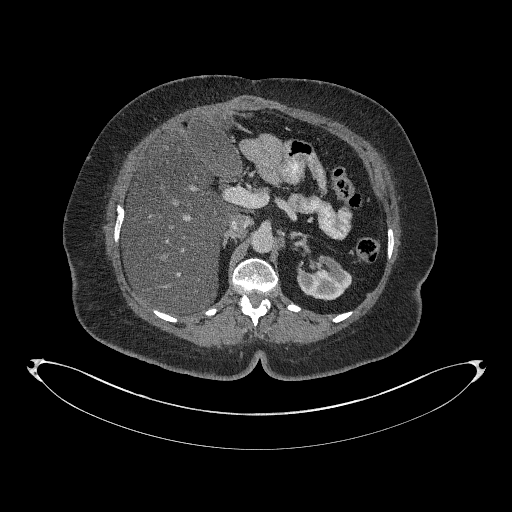
[im 855/1059  soft-tissue]
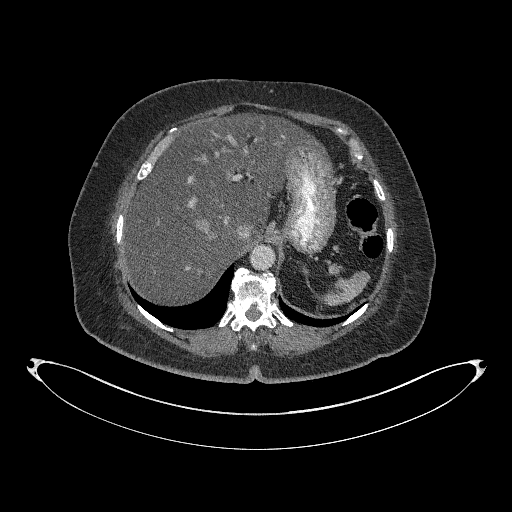
[im 936/1059  soft-tissue]
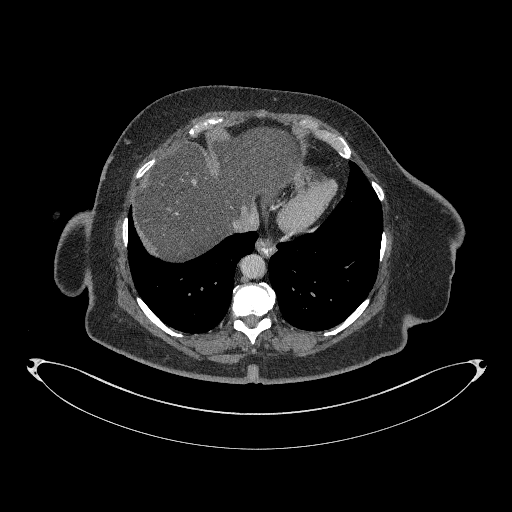
[im 1018/1059  soft-tissue]
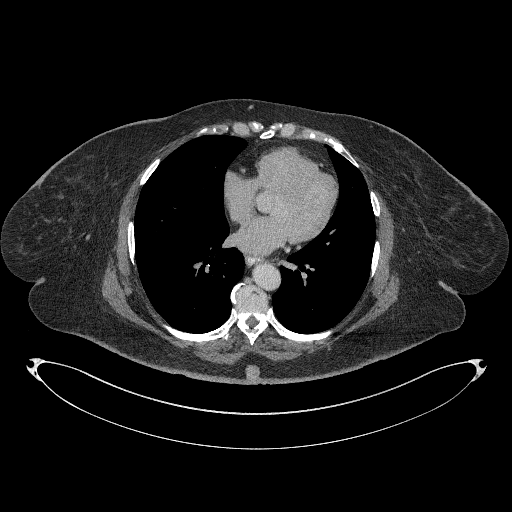

[Series 5: coronal st · coronal · 0.86mm/px · 3 of 96 slices shown]
[im 32/96  soft-tissue]
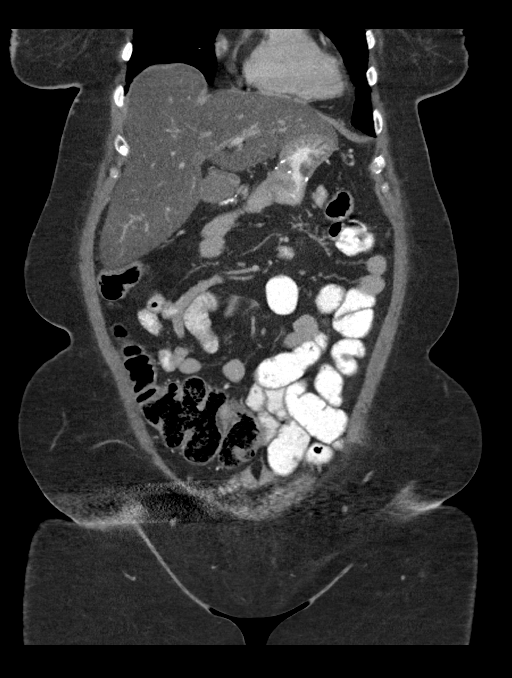
[im 43/96  soft-tissue]
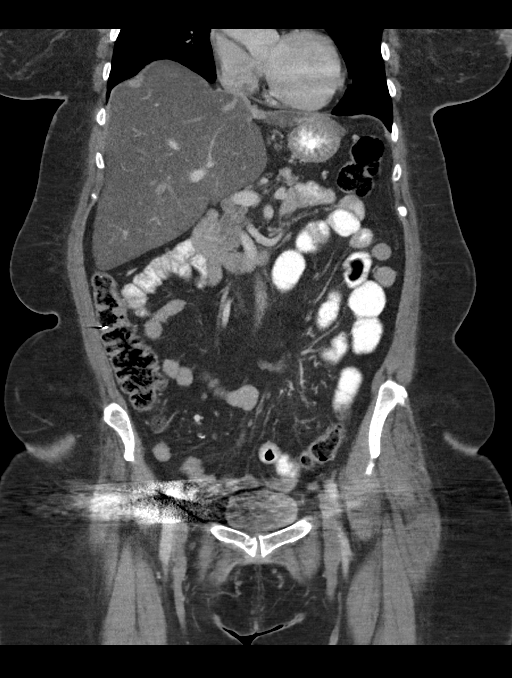
[im 53/96  soft-tissue]
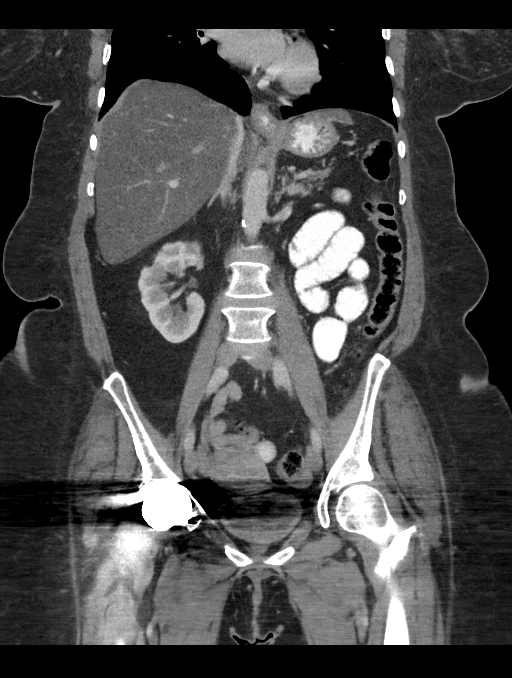

[16 of 46 positions shown; findings below may reference images not displayed]

FINDINGS: Lung bases are free of acute infiltrate or sizable effusion.

Diffuse fatty infiltration of the liver is seen. The gallbladder is
well distended. The spleen, adrenal glands and pancreas are within
normal limits. Thickening of the distal esophagus is noted which may
be related to underlying reflux. Kidneys are well visualized
bilaterally and again demonstrate some mild scarring. No obstructive
changes are noted.

Aortoiliac calcifications are seen. The appendix has been surgically
removed. Diverticular changes noted within the colon without
evidence of diverticulitis. No free pelvic fluid is seen. The
bladder is partially distended. The uterus is within normal limits.
Postsurgical changes are noted consistent with prior hip replacement
on the right. Degenerative changes of lumbar spine are seen as well.
Stable subcutaneous fluid collection is noted related to prior
surgery.
IMPRESSION: Diffuse fatty infiltration stable from the prior exam.

Diverticulosis without diverticulitis.

No acute abnormality is identified

## 2017-04-18 ENCOUNTER — Other Ambulatory Visit: Payer: Self-pay | Admitting: Family Medicine

## 2017-04-18 DIAGNOSIS — E1151 Type 2 diabetes mellitus with diabetic peripheral angiopathy without gangrene: Secondary | ICD-10-CM

## 2017-04-18 DIAGNOSIS — IMO0002 Reserved for concepts with insufficient information to code with codable children: Secondary | ICD-10-CM

## 2017-04-18 DIAGNOSIS — E1165 Type 2 diabetes mellitus with hyperglycemia: Principal | ICD-10-CM

## 2017-04-25 IMAGING — CR DG ABDOMEN ACUTE W/ 1V CHEST
4 series · 4 of 4 positions shown · non-contrast
Comparison: Chest radiograph performed 11/23/2015, and CT of the
abdomen and pelvis from 01/04/2016

CLINICAL DATA: Acute onset of diarrhea and generalized abdominal
pain. Initial encounter.

EXAM:
DG ABDOMEN ACUTE W/ 1V CHEST

[w chest pa]
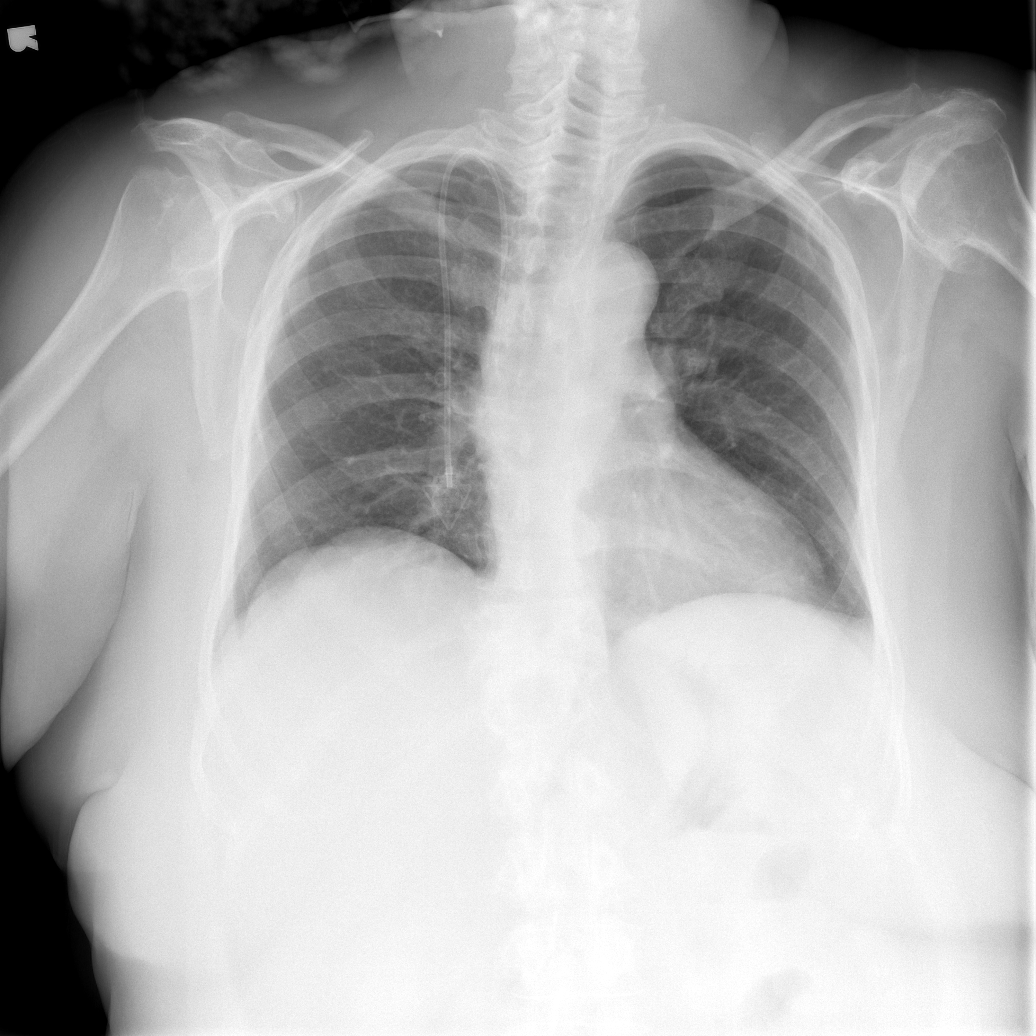

[w abdomen upright]
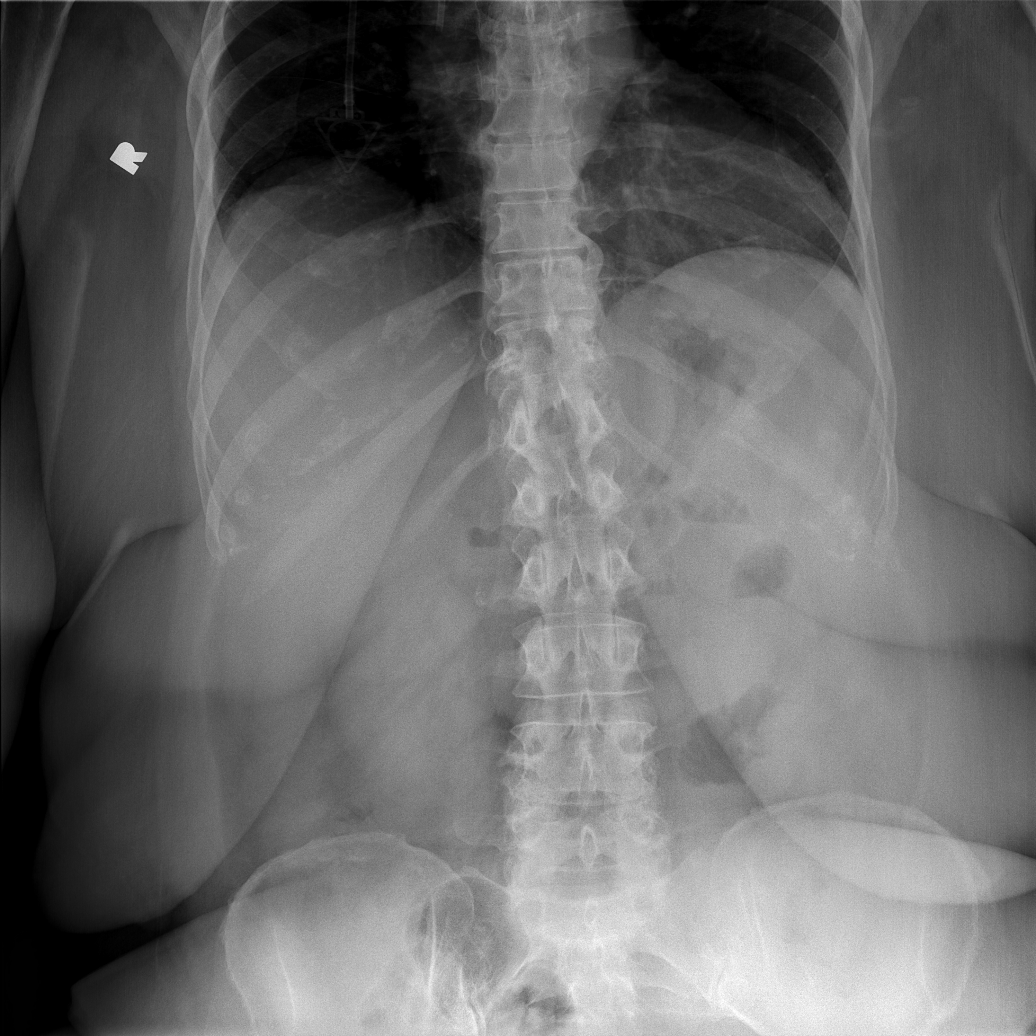

[t abdomen supine (1 of 2)]
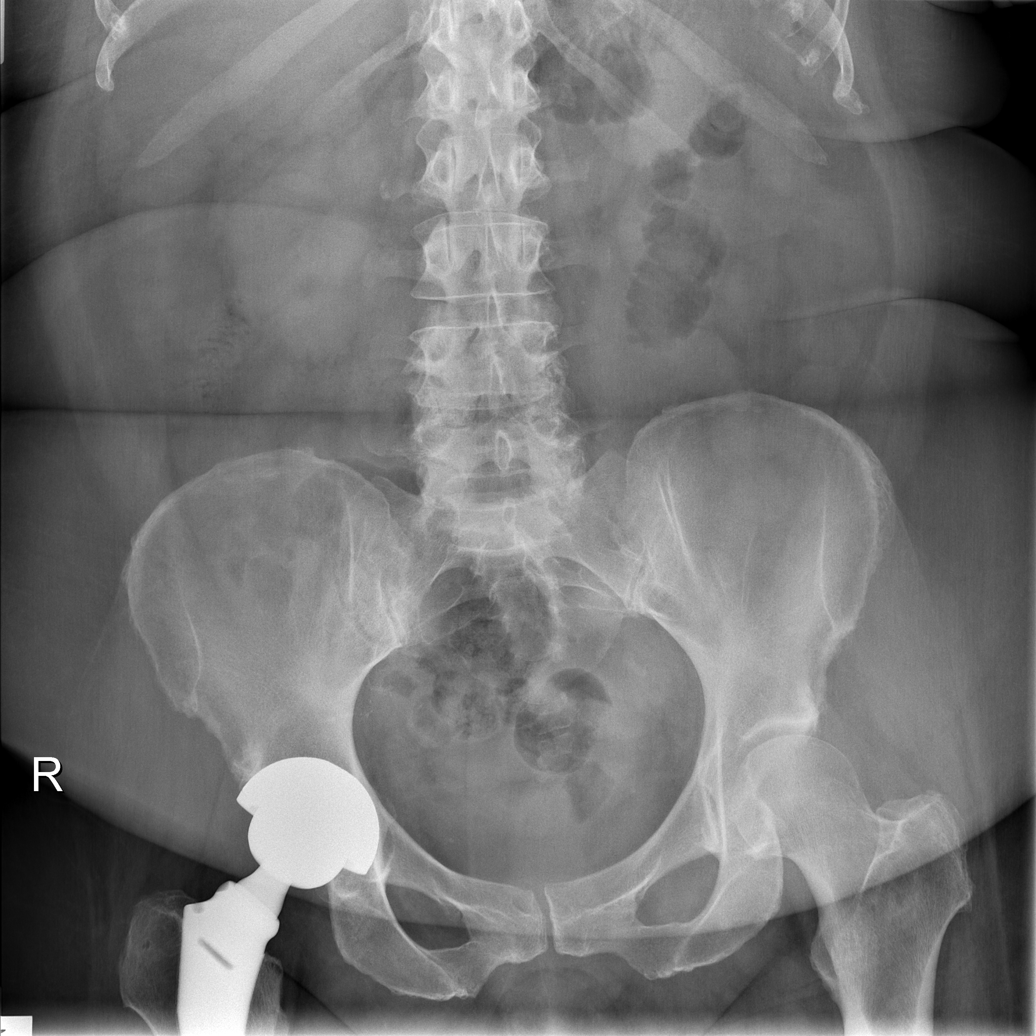

[t abdomen supine (2 of 2)]
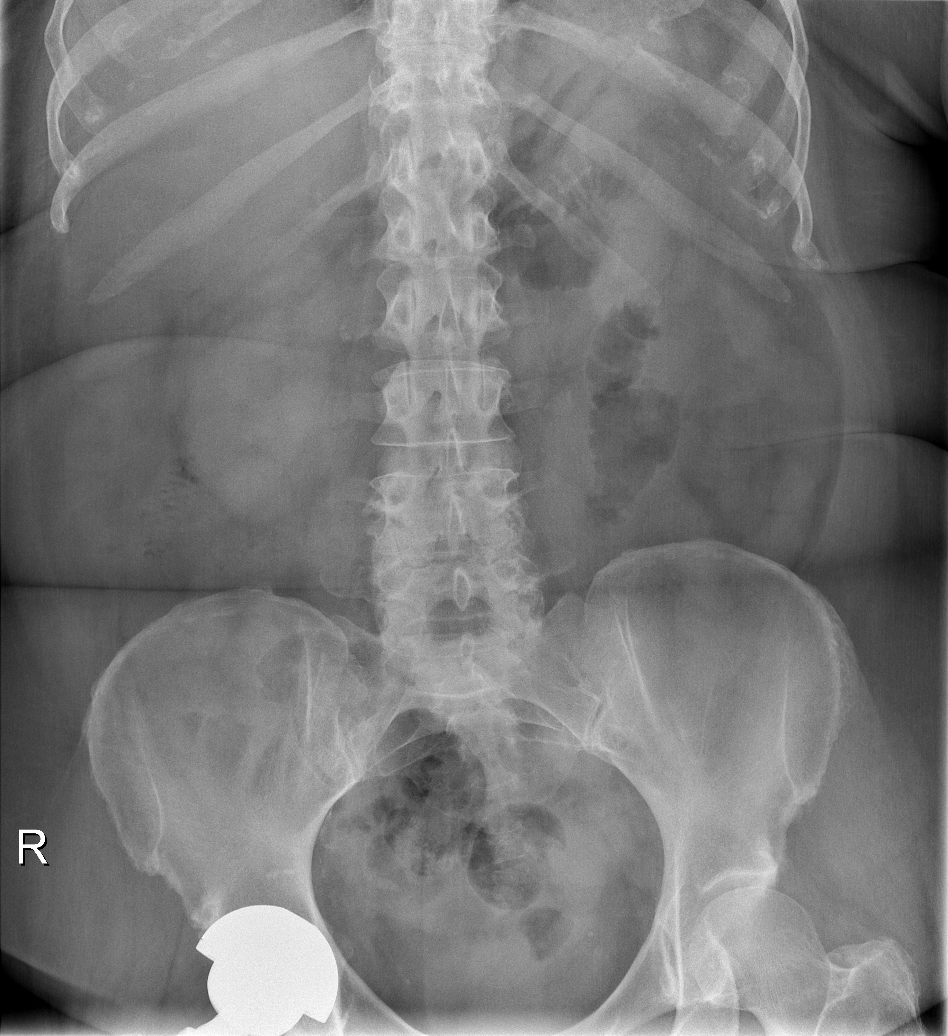

[4 of 4 positions shown; findings below may reference images not displayed]

FINDINGS: The lungs are well-aerated and clear. There is no evidence of focal
opacification, pleural effusion or pneumothorax. The
cardiomediastinal silhouette is within normal limits. A right-sided
chest port is noted ending about the mid SVC.

The visualized bowel gas pattern is unremarkable. Scattered stool
and air are seen within the colon; there is no evidence of small
bowel dilatation to suggest obstruction. No free intra-abdominal air
is identified on the provided upright view.

No acute osseous abnormalities are seen; the sacroiliac joints are
unremarkable in appearance. A right-sided hip arthroplasty is
grossly unremarkable in appearance, without evidence of loosening,
though incompletely imaged.
IMPRESSION: 1. Unremarkable bowel gas pattern; no free intra-abdominal air seen.
2. No acute cardiopulmonary process seen.

## 2017-04-26 IMAGING — CT CT ANGIO ABDOMEN
3 of 11 series · 11 of 46 positions shown, 17 images · IV contrast (ISOVUE 370)
Comparison: Plain films of 1 day prior.  CT of 01/04/2016.

CLINICAL DATA: Generalized abdominal pain for the past week. One
episode of diarrhea last night. Her history of gastric cancer
diagnosed in 8474. Hypertension. Diabetes. Hyperlipidemia.

EXAM:
CT ANGIOGRAPHY ABDOMEN
TECHNIQUE: Multidetector CT imaging of the abdomen was performed using the
standard protocol during bolus administration of intravenous
contrast. Multiplanar reconstructed images including MIPs were
obtained and reviewed to evaluate the vascular anatomy.
CONTRAST:  80 cc of Isovue 370

[Series 5: venous 5.0 b30f · axial · portal-venous · 0.76mm/px · z∈[+913,+1293]mm · 8 of 98 slices shown, 13 images]
[im 11/98  soft-tissue]
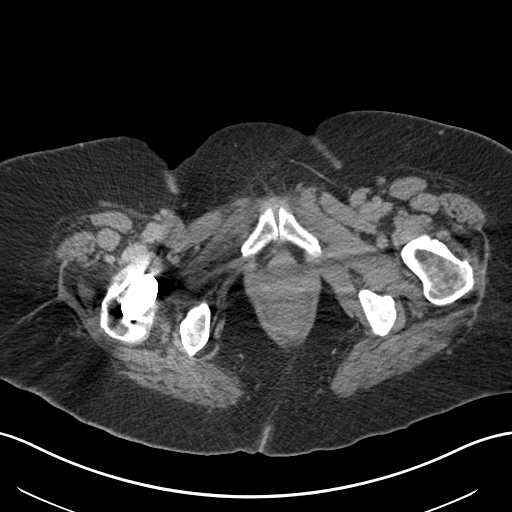
[im 11/98  bone]
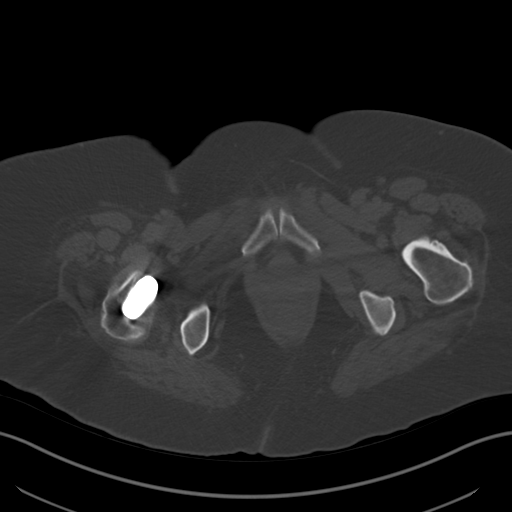
[im 22/98  soft-tissue]
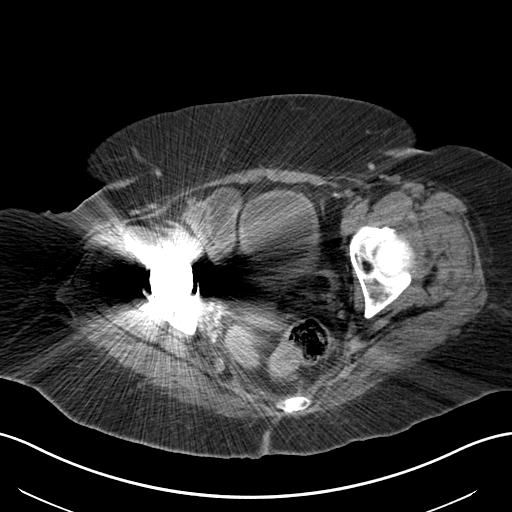
[im 33/98  soft-tissue]
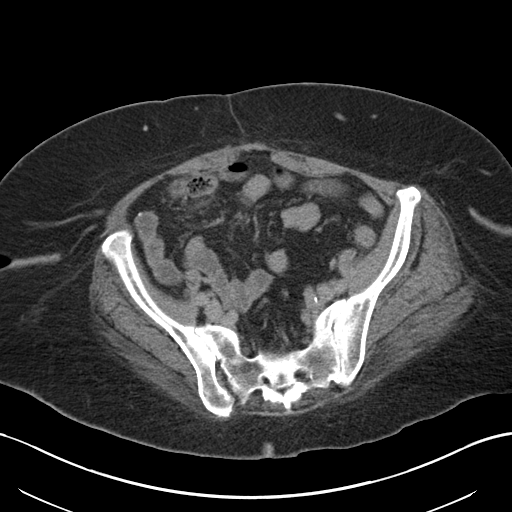
[im 44/98  soft-tissue]
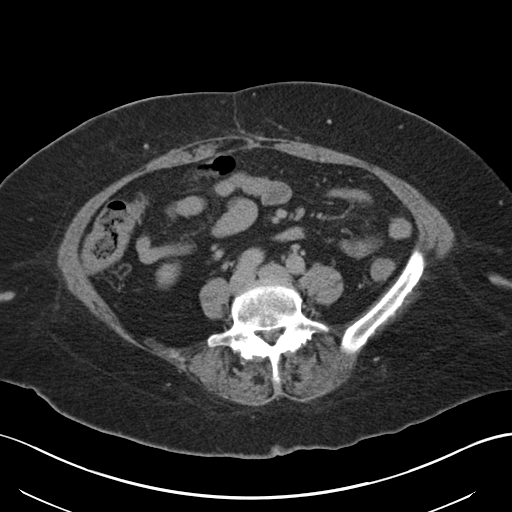
[im 54/98  soft-tissue]
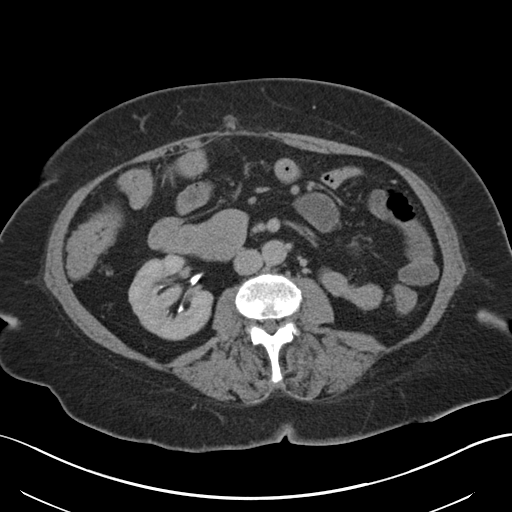
[im 54/98  lung]
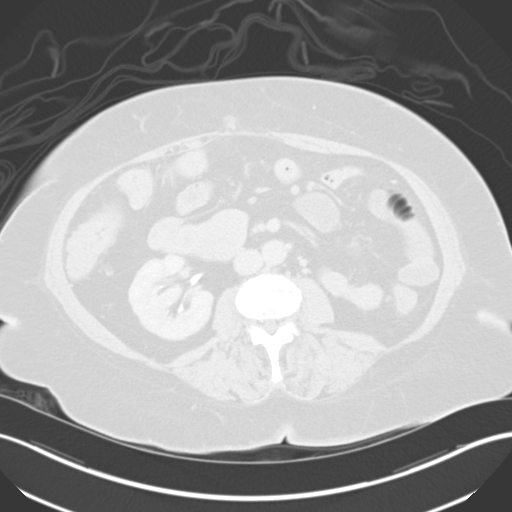
[im 65/98  soft-tissue]
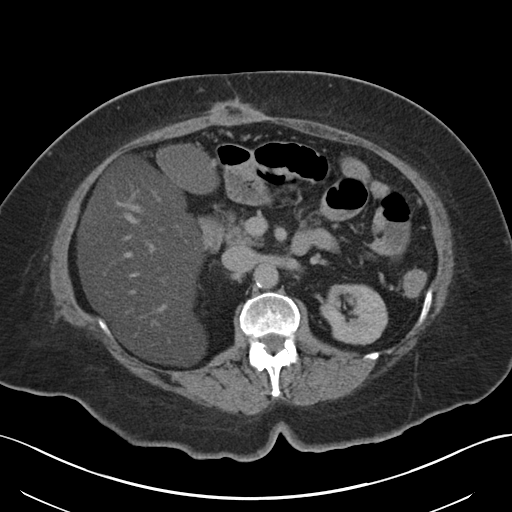
[im 65/98  lung]
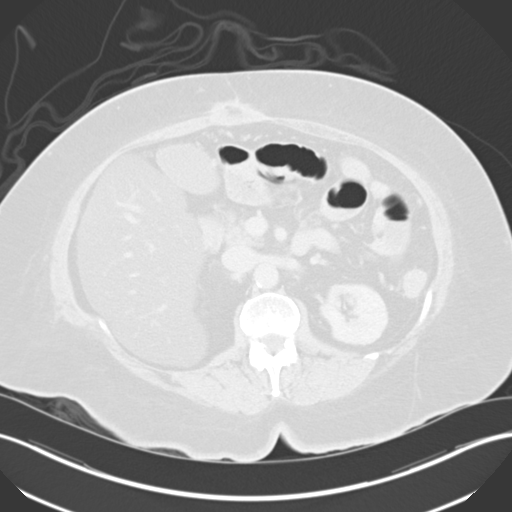
[im 76/98  soft-tissue]
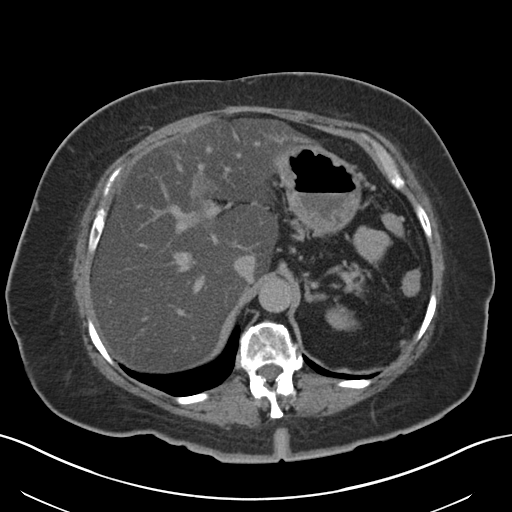
[im 76/98  lung]
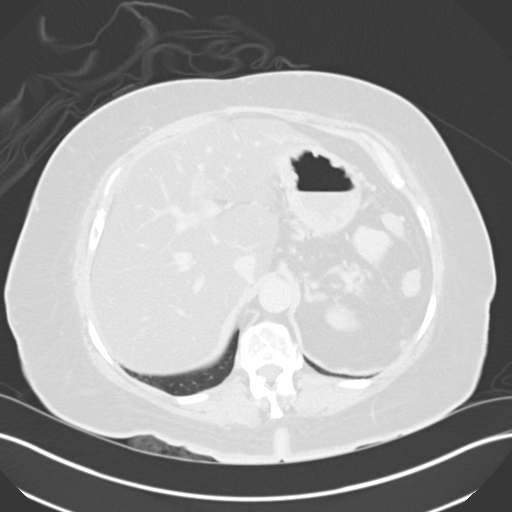
[im 87/98  soft-tissue]
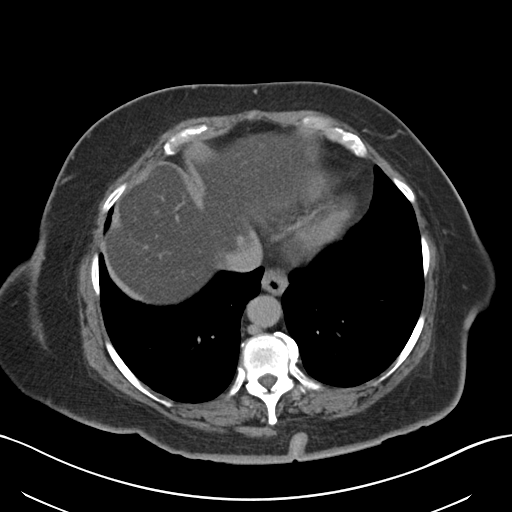
[im 87/98  lung]
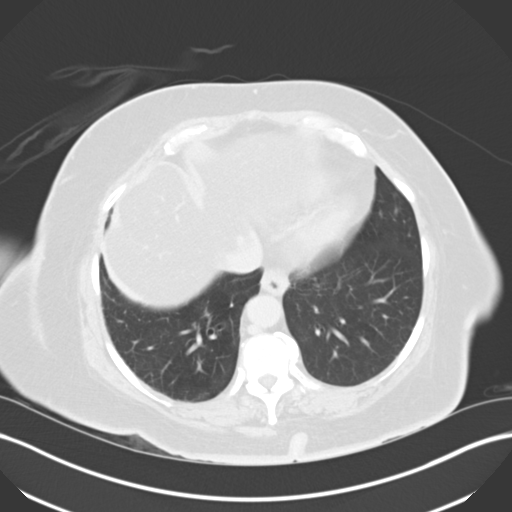

[Series 8: coronal · coronal · 0.72mm/px · 1 of 144 slices shown, 2 images]
[im 72/144  soft-tissue]
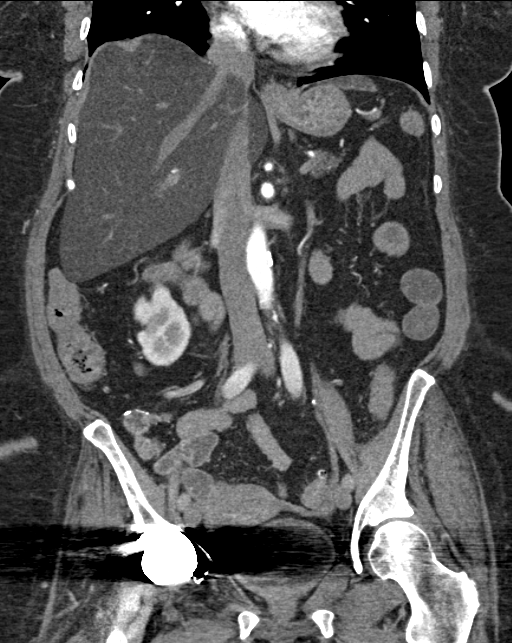
[im 72/144  bone]
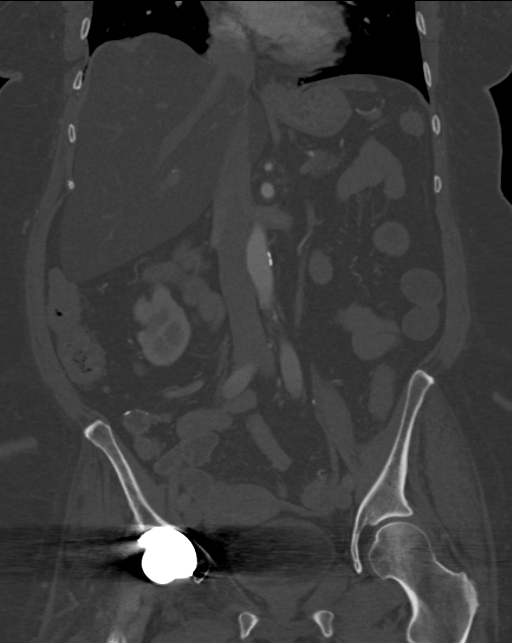

[Series 12: lung · axial · 0.77mm/px · z∈[+1276,+1300]mm · 2 of 49 slices shown]
[im 13/49  bone]
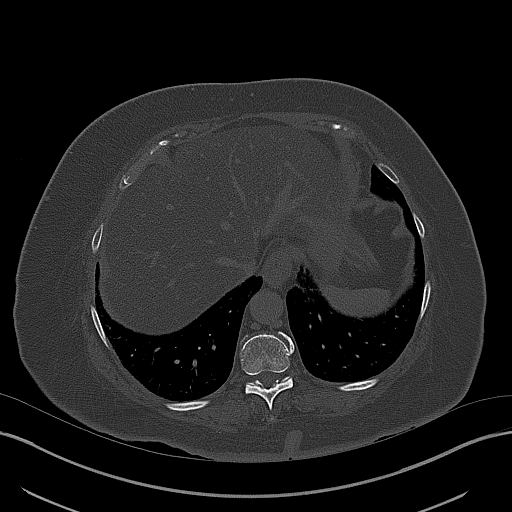
[im 25/49  bone]
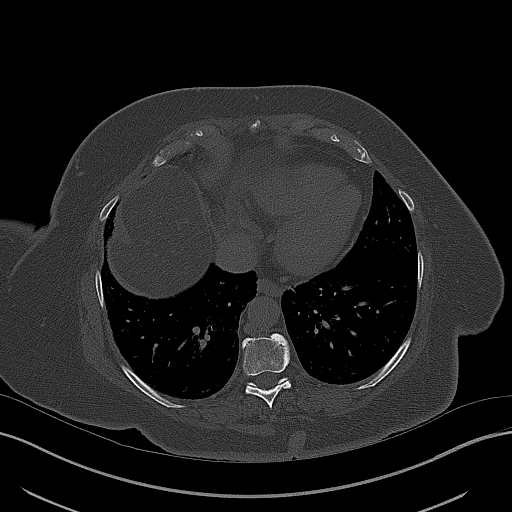

[11 of 46 positions shown; findings below may reference images not displayed]

FINDINGS: Lower chest: Clear lung bases. Tiny hiatal hernia. Mild
cardiomegaly, without pericardial or pleural effusion.

Hepatobiliary: Marked hepatic steatosis with relatively mild
hepatomegaly, 18.3 cm craniocaudal. Mild sparing in the posterior
aspect of segment 4. Normal gallbladder, without biliary ductal
dilatation.

Pancreas: Moderate pancreatic atrophy, without duct dilatation or
dominant mass.

Spleen: Normal in size, without focal abnormality.

Adrenals/Urinary Tract: Normal adrenal glands. Renal scarring, worse
on the left. Decreased perfusion and function involving the upper
pole left kidney. No hydronephrosis. Degraded evaluation of the
pelvis, secondary to beam hardening artifact from right hip
arthroplasty. Urinary bladder not well evaluated.

Stomach/Bowel: Surgical changes of partial gastrectomy. The proximal
stomach is underdistended. No dominant gastric mass identified.
Suspect prior gastrojejunostomy.

Scattered colonic diverticula. Muscular hypertrophy involving the
sigmoid. Underdistention throughout the majority of the colon.
Normal small bowel.

Vascular/Lymphatic: Aorta is normal in caliber. Atherosclerosis.
celiac is widely patent. The superior mesenteric artery is within
normal limits. The inferior mesenteric artery is patent proximally.
No significant stenosis involving the renal arteries.

The pelvic vasculature is not well opacified, but no aneurysm is
seen.

No abdominal and no gross pelvic adenopathy.

Reproductive: Normal imaged uterus.

Other: No gross free pelvic fluid.

Suspect prior ventral abdominal wall hernia repair, including on
image 47/series 4.

Musculoskeletal: Trace L4-5 anterolisthesis. Thoracolumbar
spondylosis.

Review of the MIP images confirms the above findings.
IMPRESSION: 1. Atherosclerosis, without mesenteric stenosis or thrombosis.
2.  No acute process in the abdomen or pelvis.
3. Marked hepatic steatosis.
4. Small hiatal hernia. Partial gastrectomy with probable
gastrojejunostomy. No evidence of metastatic disease.
5. Scarred kidneys, worse on the left.
6. Degraded evaluation of the pelvis, secondary to beam hardening
artifact from right hip arthroplasty.

## 2017-04-29 IMAGING — US US ABDOMEN LIMITED
1 series · 14 of 25 positions shown · non-contrast
Comparison: CT abdomen pelvis earlier same date

CLINICAL DATA: Right upper quadrant pain radiating to the right
flank. Nausea and vomiting.

EXAM:
US ABDOMEN LIMITED - RIGHT UPPER QUADRANT

[Series 1: us abdomen limited · 0.24mm/px · 14 of 55 slices shown]
[im 1/55]
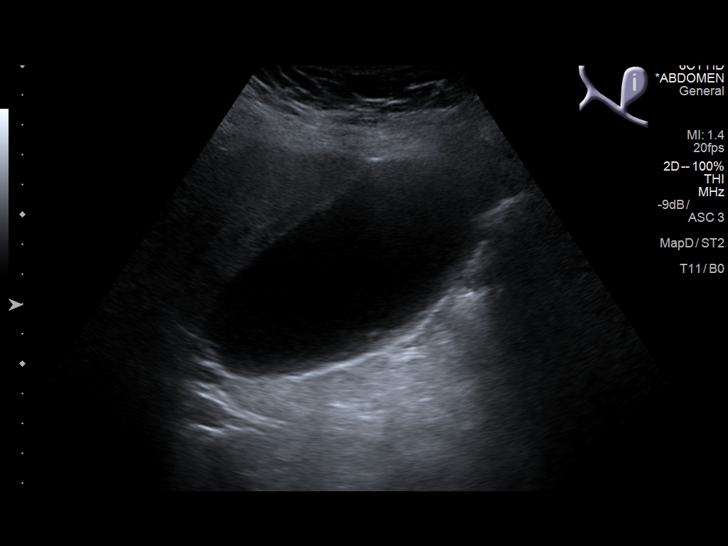
[im 5/55]
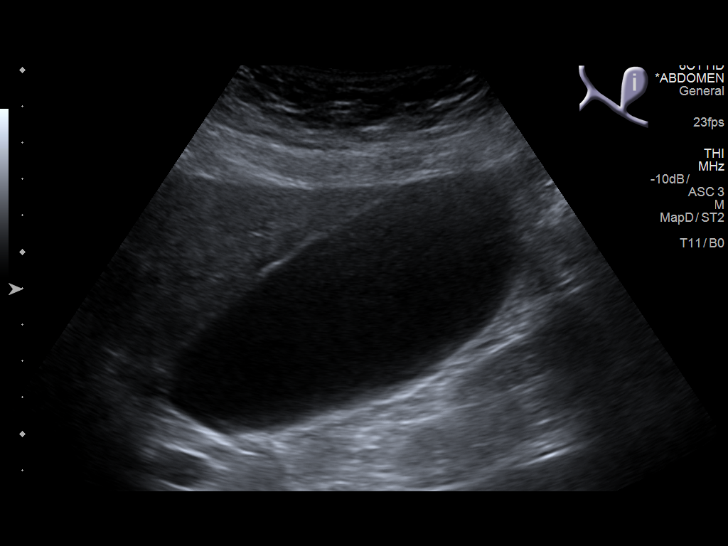
[im 10/55]
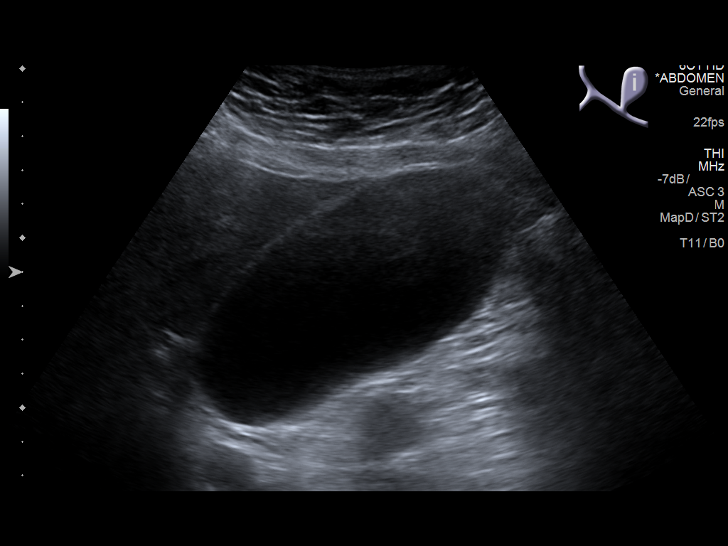
[im 14/55]
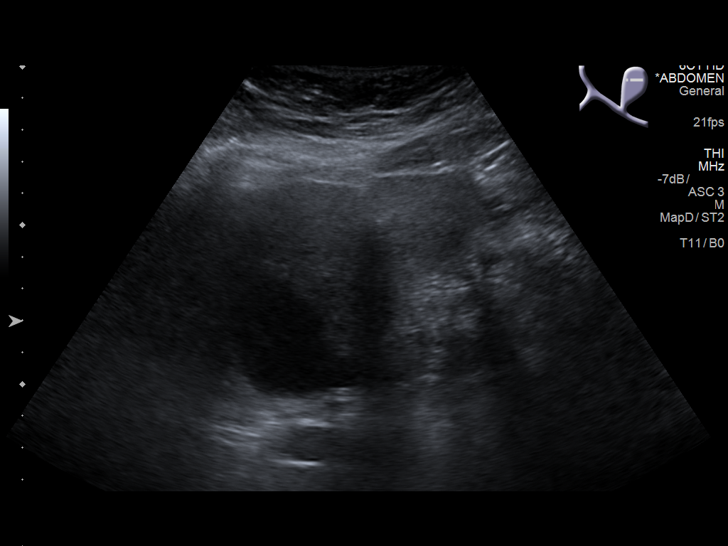
[im 19/55]
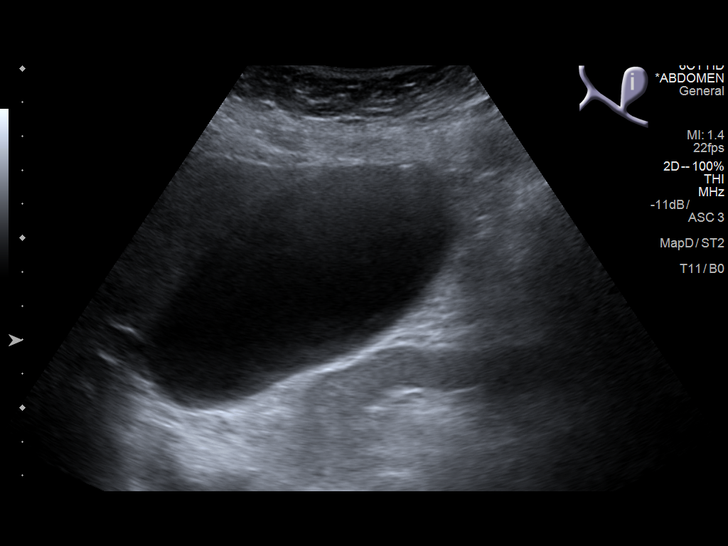
[im 21/55]
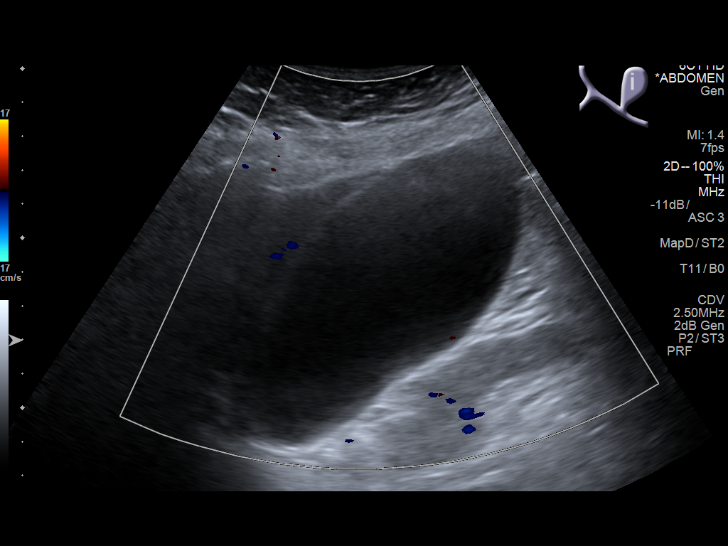
[im 25/55]
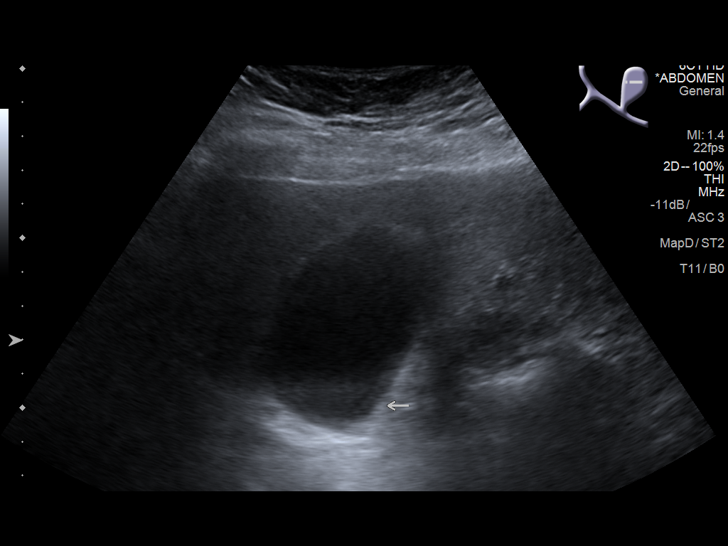
[im 30/55]
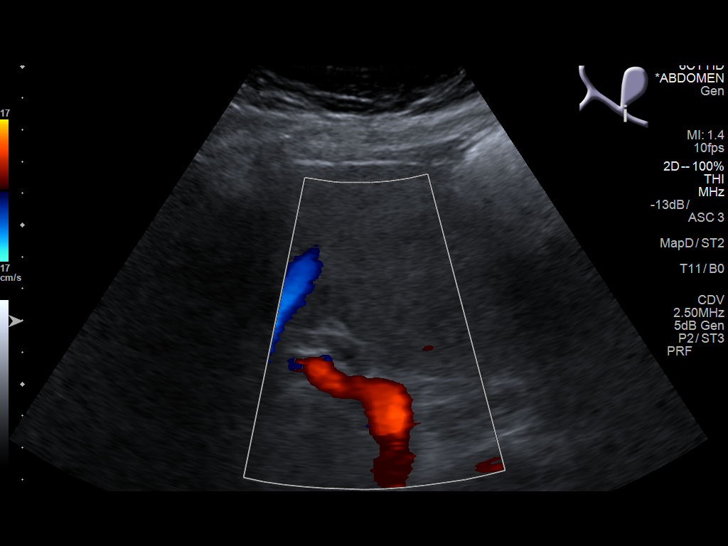
[im 34/55]
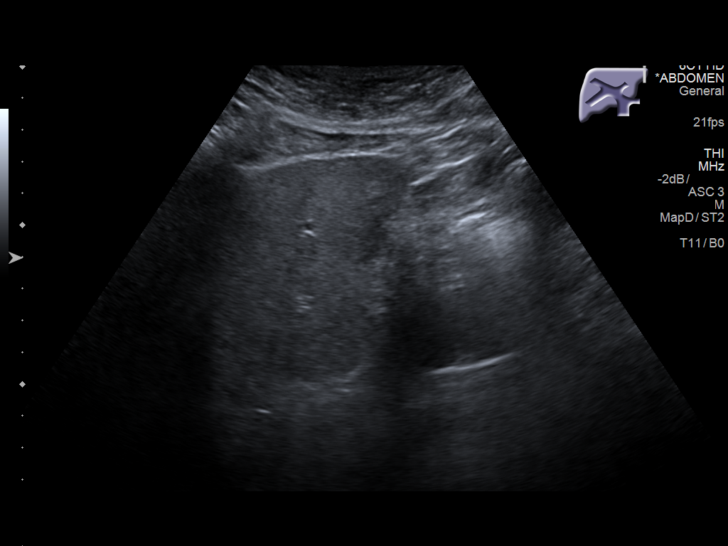
[im 37/55]
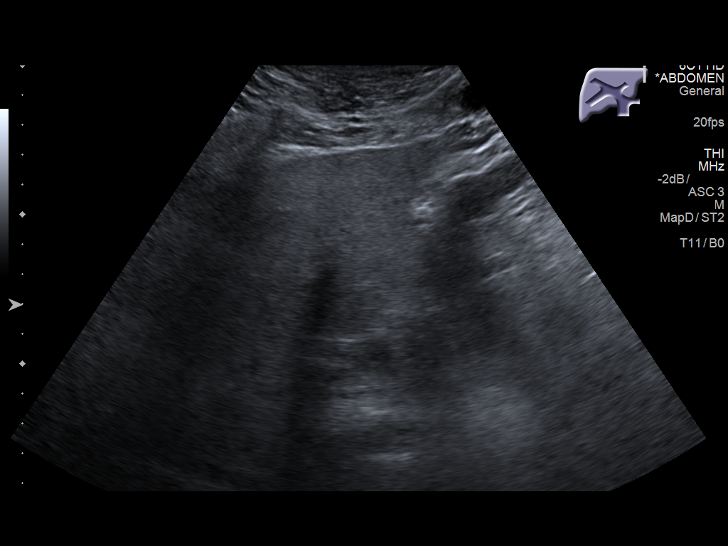
[im 41/55]
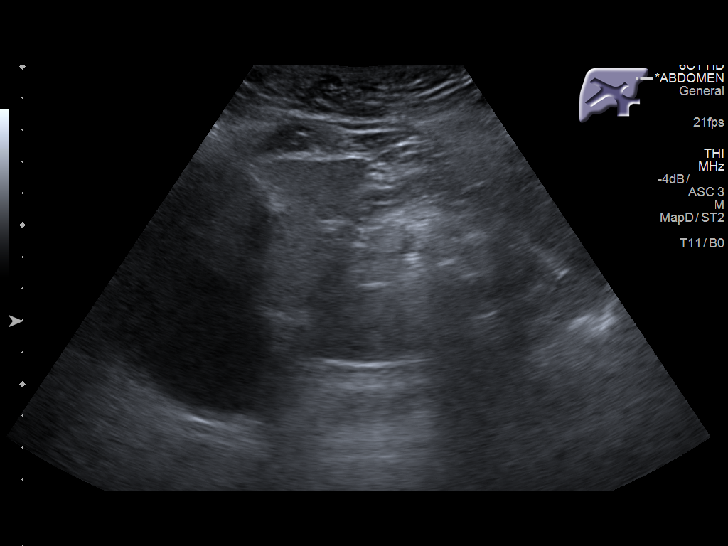
[im 46/55]
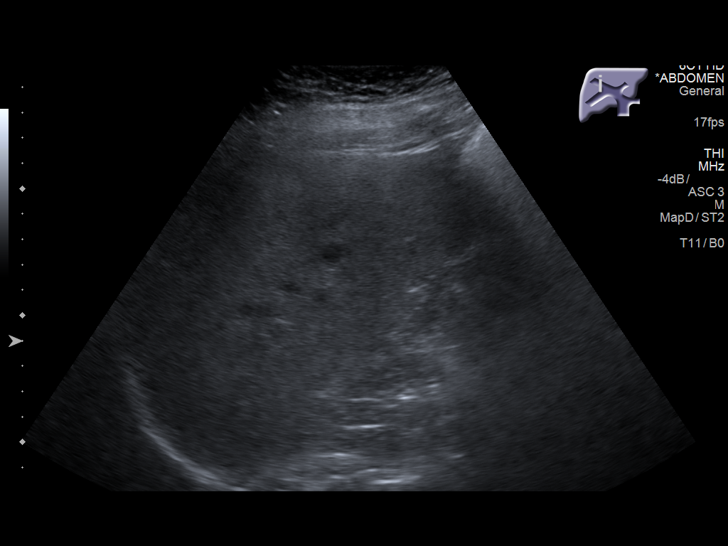
[im 50/55]
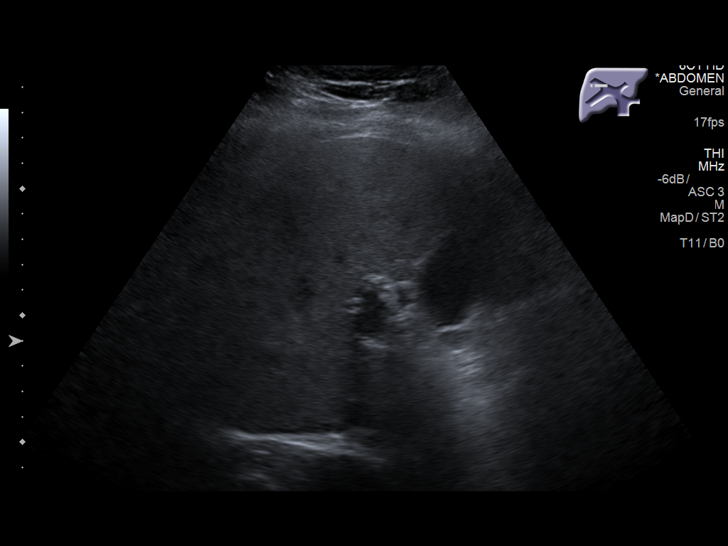
[im 55/55]
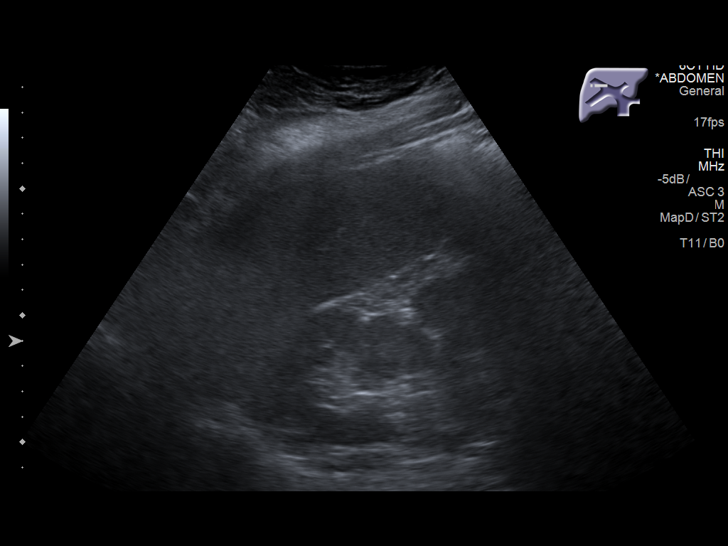

[14 of 25 positions shown; findings below may reference images not displayed]

FINDINGS: Gallbladder:

The gallbladder is mildly distended. No gallbladder wall thickening
or pericholecystic fluid. No cholelithiasis. Negative sonographic
Murphy's sign.

Common bile duct:

Diameter: 4.5 mm

Liver:

Diffusely increased in echogenicity.  No focal lesion identified.
IMPRESSION: No sonographic evidence to suggest acute cholecystitis.

Hepatic steatosis.

## 2017-05-01 IMAGING — US US SOFT TISSUE HEAD/NECK
1 series · 14 of 25 positions shown · non-contrast
Comparison: 09/27/2014

CLINICAL DATA: Follow-up thyroid nodules

EXAM:
THYROID ULTRASOUND
TECHNIQUE: Ultrasound examination of the thyroid gland and adjacent soft
tissues was performed.

[Series 1: us soft tissue head/neck · 0.07mm/px · 14 of 70 slices shown]
[im 1/70]
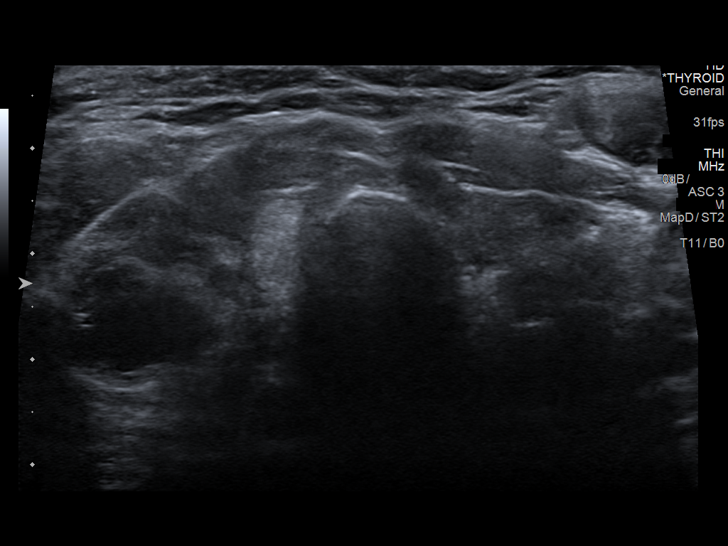
[im 6/70]
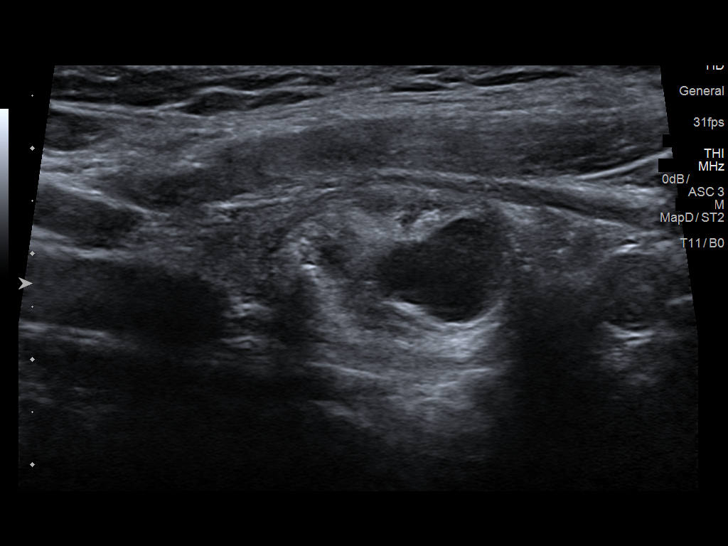
[im 12/70]
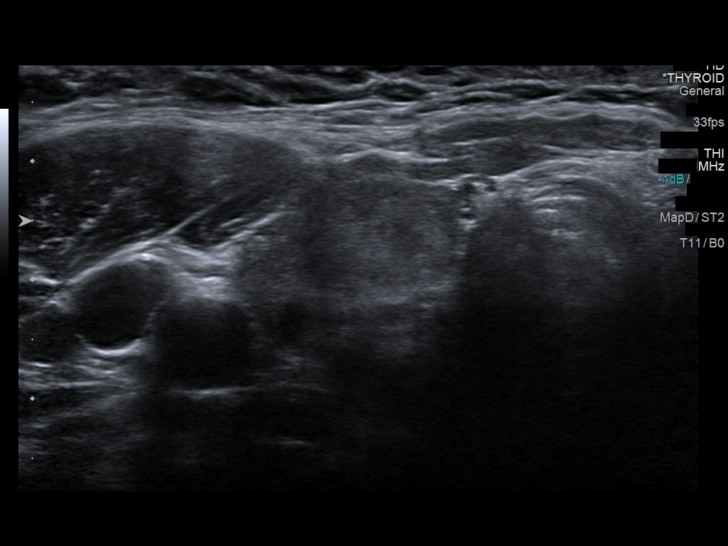
[im 18/70]
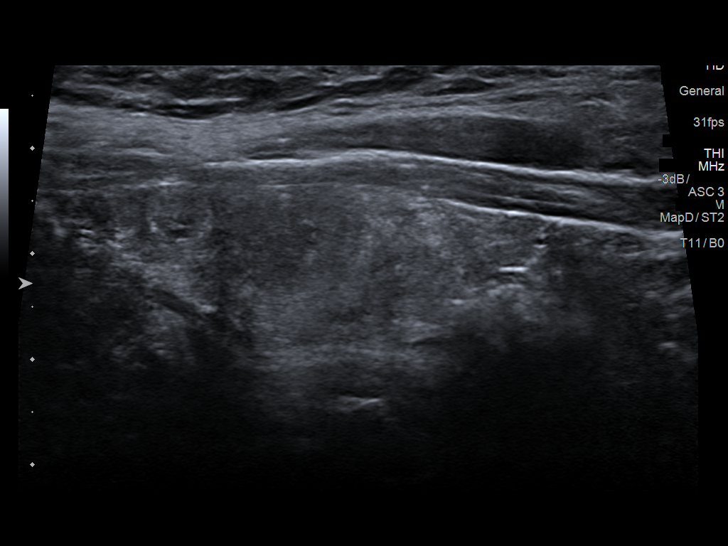
[im 24/70]
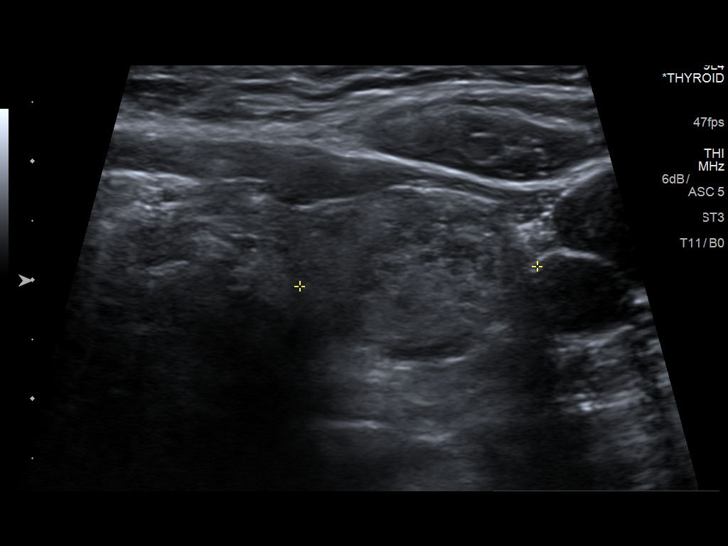
[im 26/70]
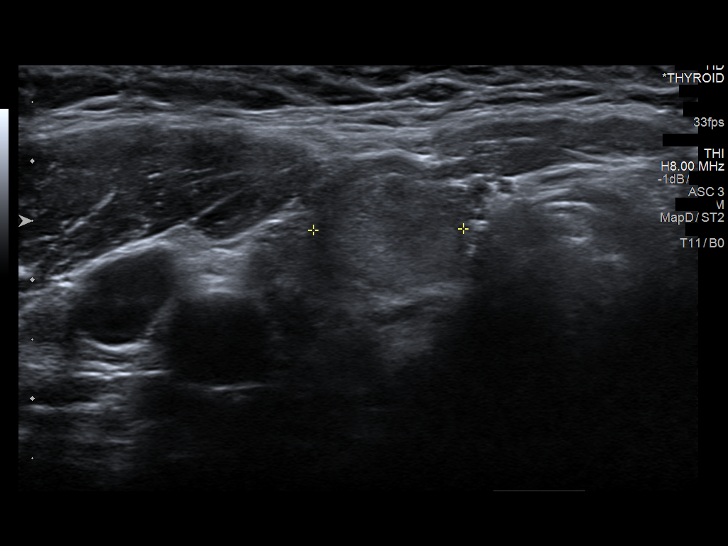
[im 32/70]
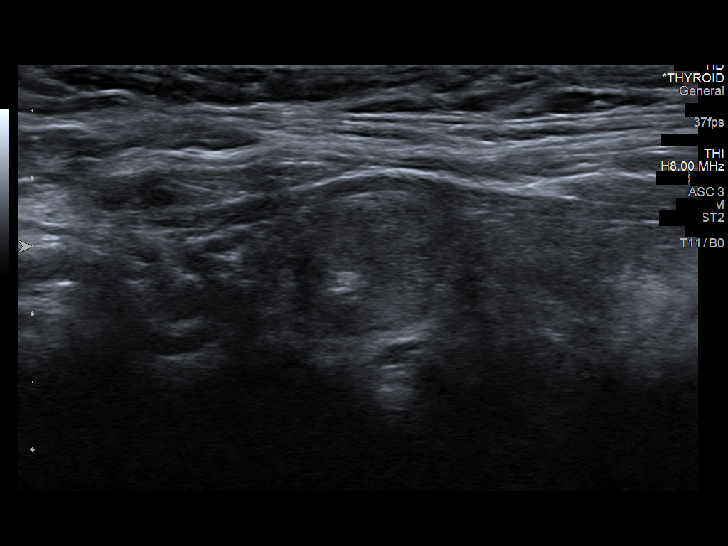
[im 38/70]
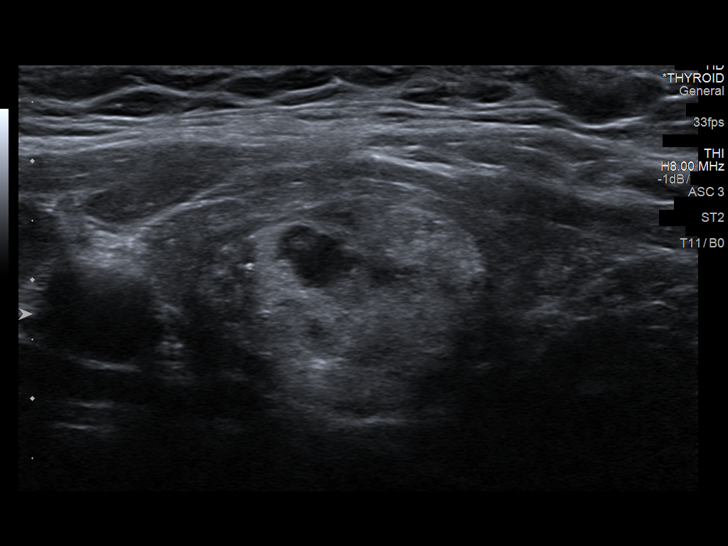
[im 44/70]
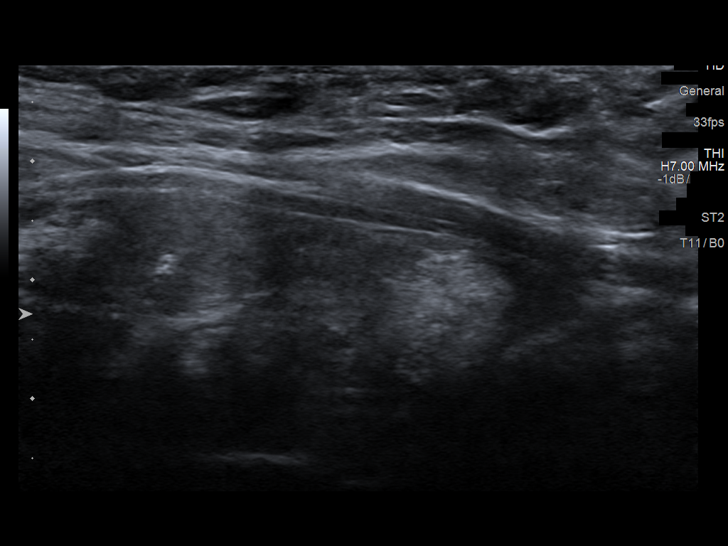
[im 47/70]
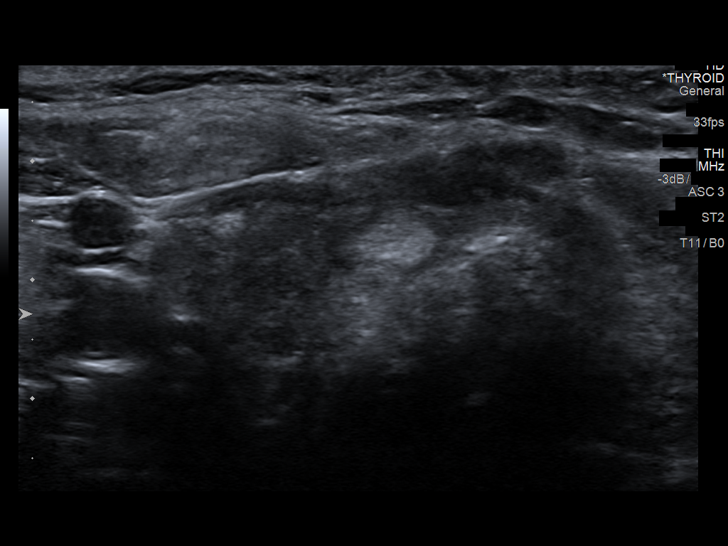
[im 52/70]
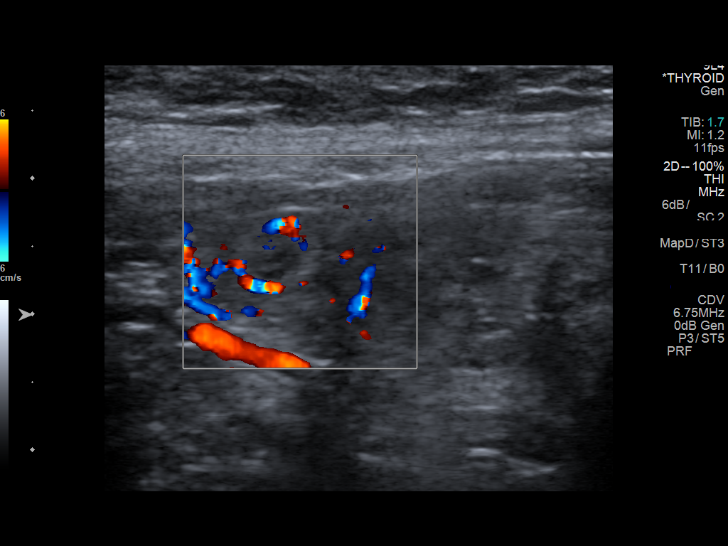
[im 58/70]
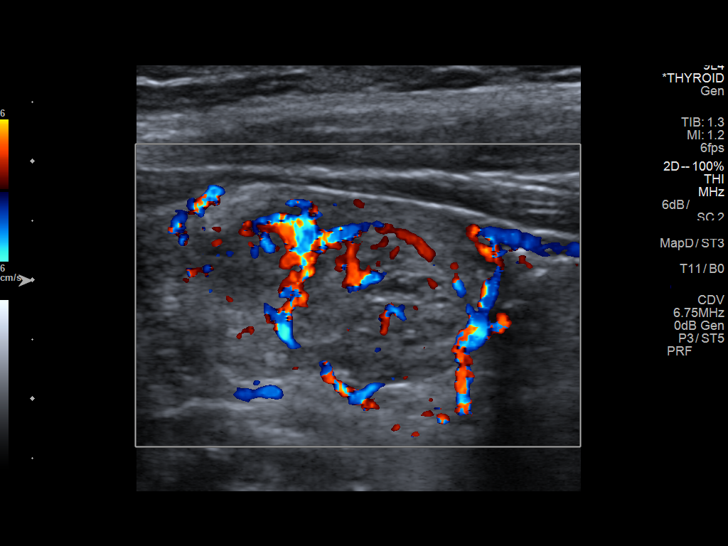
[im 64/70]
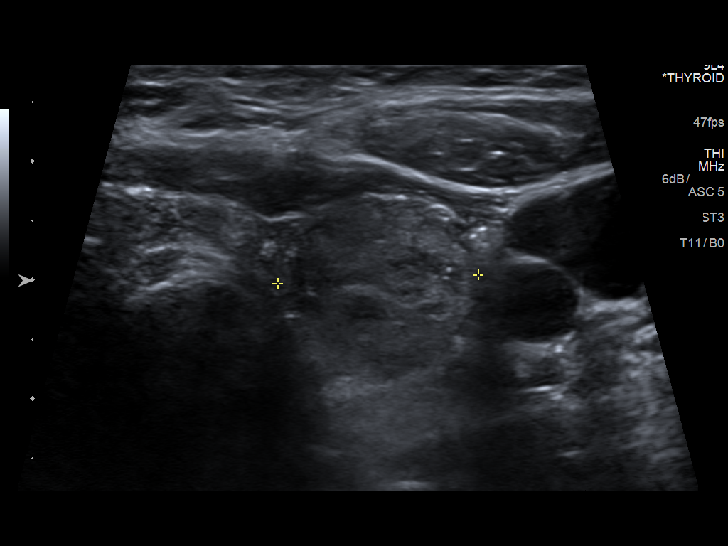
[im 70/70]
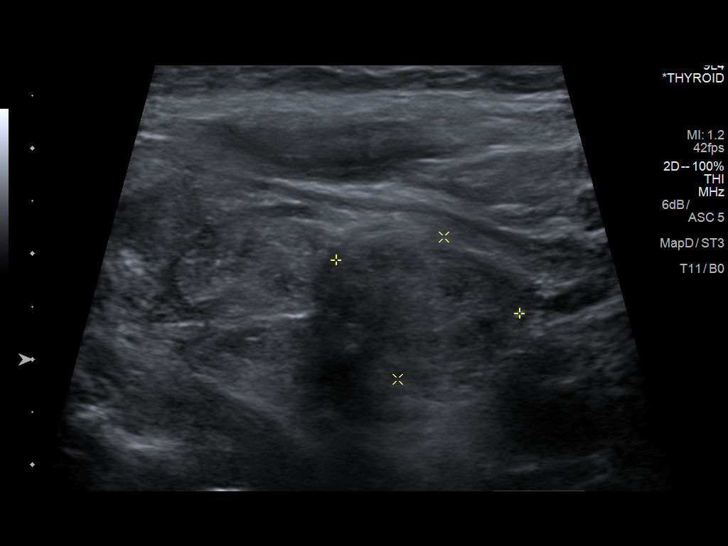

[14 of 25 positions shown; findings below may reference images not displayed]

FINDINGS: Right thyroid lobe

Measurements: 4.4 x 2.5 x 2.5 cm. 12 x 13 x 10 mm upper pole
hypoechoic nodule with calcification. Previously, this measured up
to 2.0 cm. Complex mid lobe nodule measures 25 x 22 x 18 mm and
previously measured up to 35 mm. Smaller lower pole hypoechoic
nodule measures 10 mm.

Left thyroid lobe

Measurements: 5.2 x 1.6 x 2.0 cm. Mid lobe nodule measures 29 x 17 x
15 mm. Previously, it measured up to 30 mm. 8 mm upper pole complex
nodule. Lower pole hypoechoic nodule with calcification measures 18
x 15 x 14 mm. Previously, it measured up to 20 mm.

Isthmus

Thickness: 2 mm.  5 mm nodule.

Lymphadenopathy

None visualized.
IMPRESSION: Multiple bilateral nodules are smaller than on prior studies.
Stability or regression over 2 years supports benign etiology.

## 2017-05-17 IMAGING — CT CT CERVICAL SPINE W/O CM
4 of 7 series · 15 of 33 positions shown, 16 images · non-contrast
Comparison: 02/17/2012 head CT and prior exams

CLINICAL DATA: 64-year-old female with syncope, fall and head and
neck injury. Headache and cervical spine pain.

EXAM:
CT HEAD WITHOUT CONTRAST
CT CERVICAL SPINE WITHOUT CONTRAST
TECHNIQUE: Multidetector CT imaging of the head and cervical spine was
performed following the standard protocol without intravenous
contrast. Multiplanar CT image reconstructions of the cervical spine
were also generated.

[Series 4: head 3.0 mpr cor · coronal · 0.29mm/px · 2 of 66 slices shown]
[im 22/66  bone]
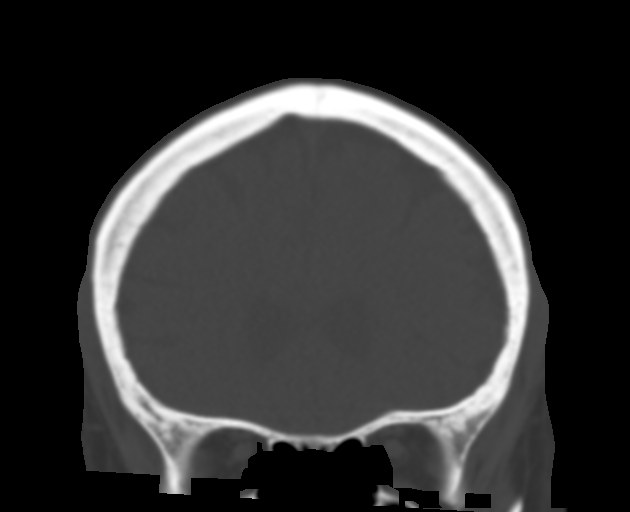
[im 44/66  bone]
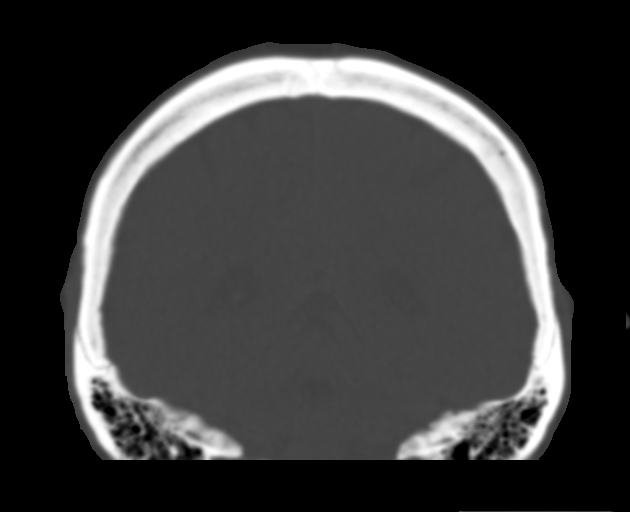

[Series 7: c_spine 2.0 i30s 3 · axial · 0.38mm/px · z∈[-250,-148]mm · 4 of 87 slices shown, 5 images]
[im 18/87  soft-tissue]
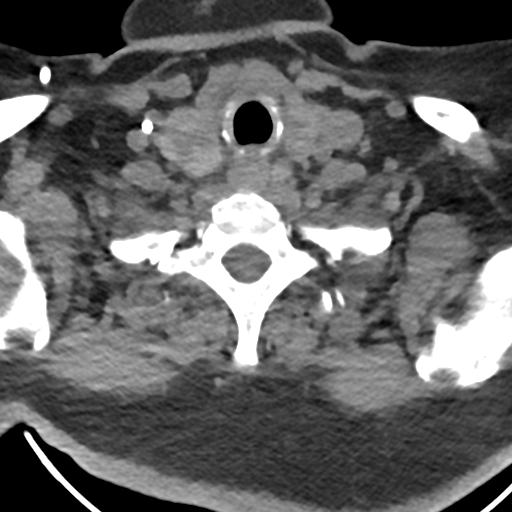
[im 18/87  bone]
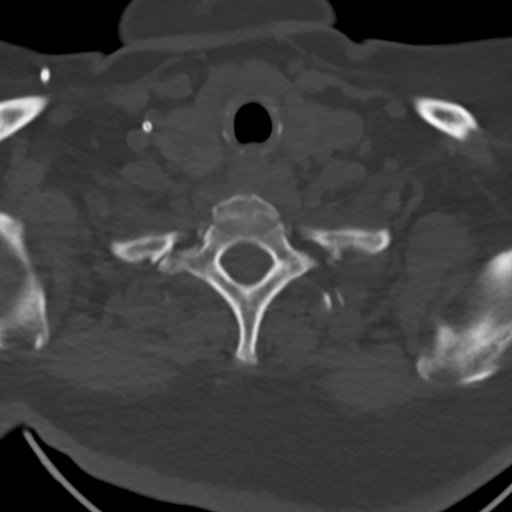
[im 35/87  bone]
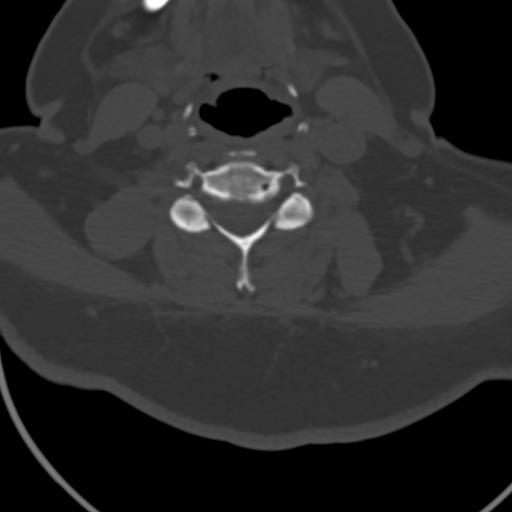
[im 52/87  bone]
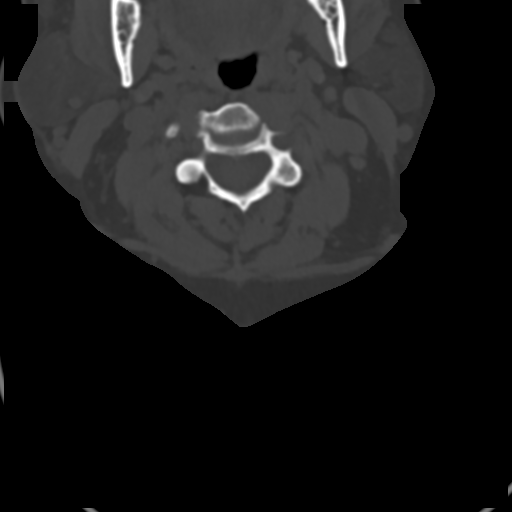
[im 69/87  bone]
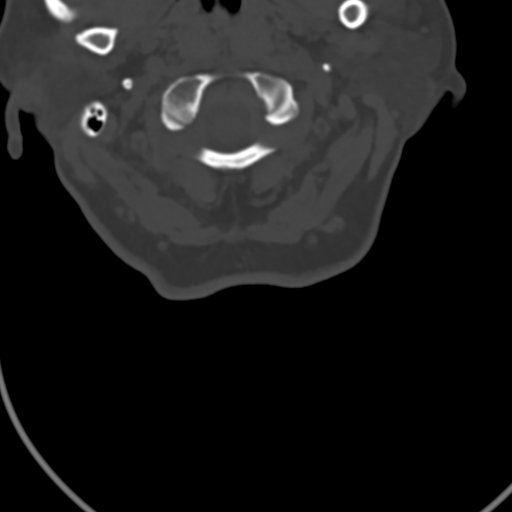

[Series 10: sagittals · sagittal · 0.31mm/px · 5 of 82 slices shown]
[im 12/82  bone]
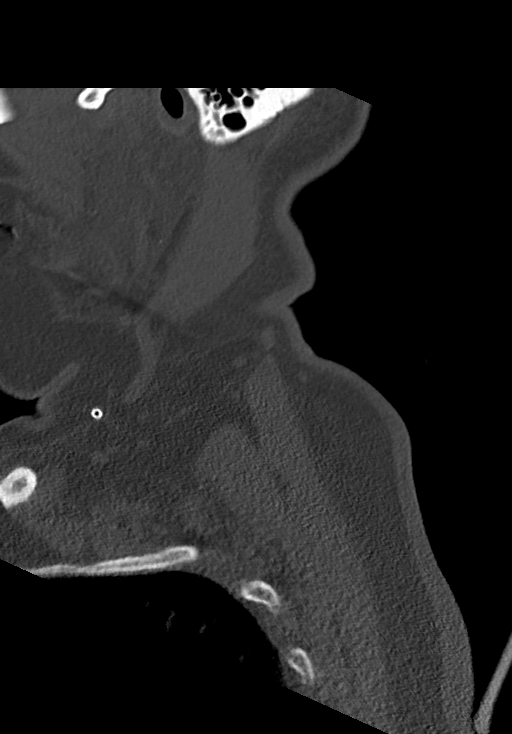
[im 24/82  bone]
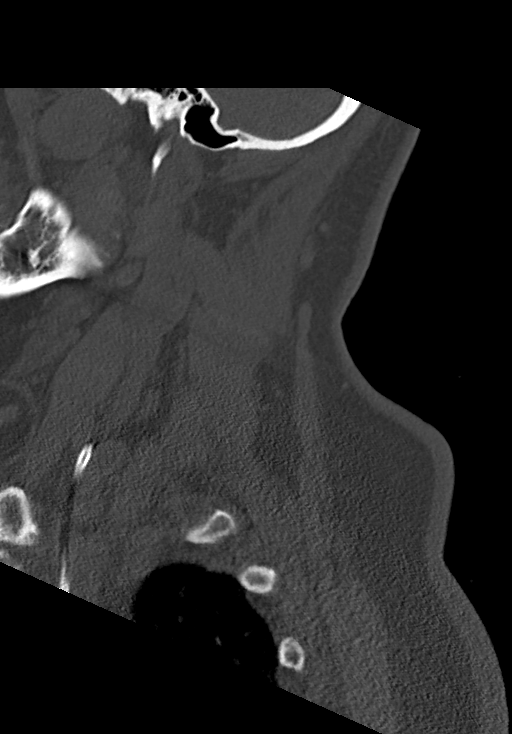
[im 35/82  bone]
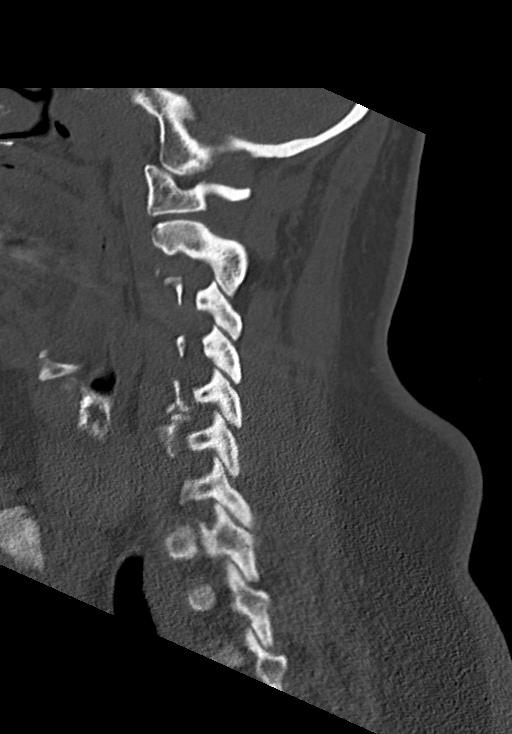
[im 47/82  bone]
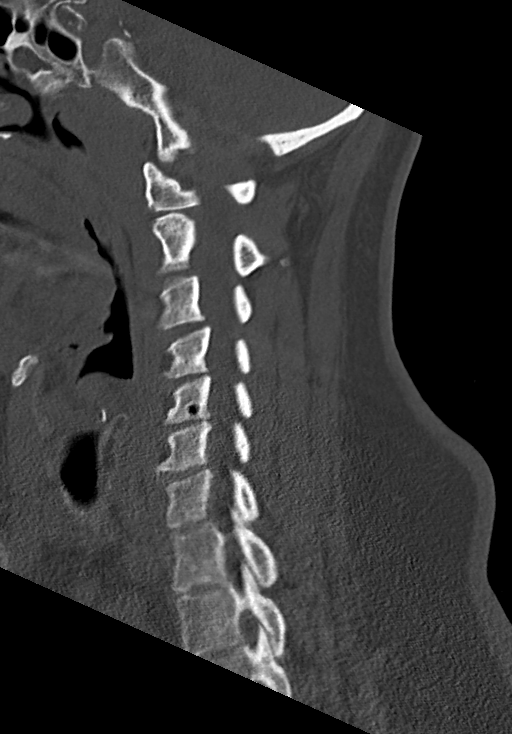
[im 58/82  bone]
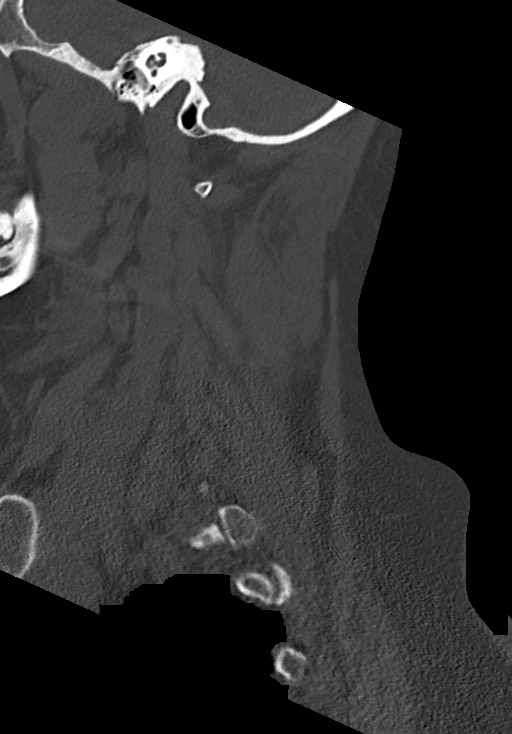

[Series 11: orthogonals · axial · 0.32mm/px · z∈[-301,-192]mm · 4 of 100 slices shown]
[im 20/100  bone]
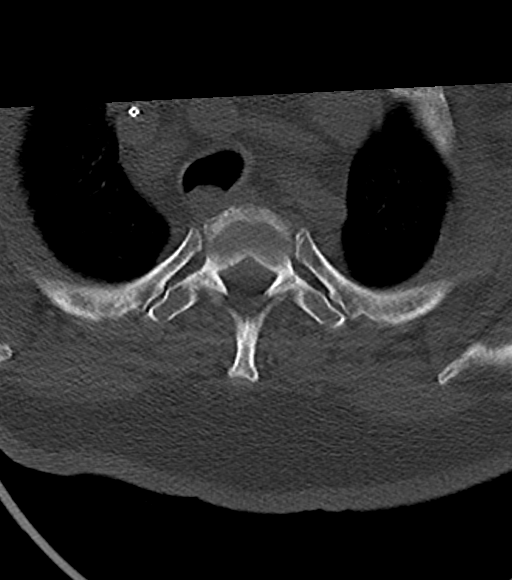
[im 40/100  bone]
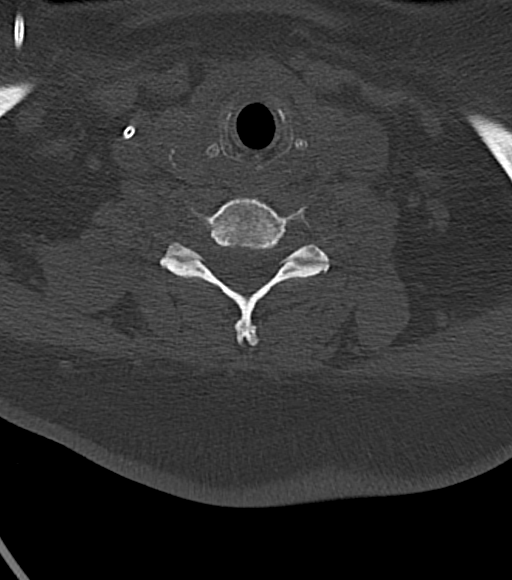
[im 60/100  bone]
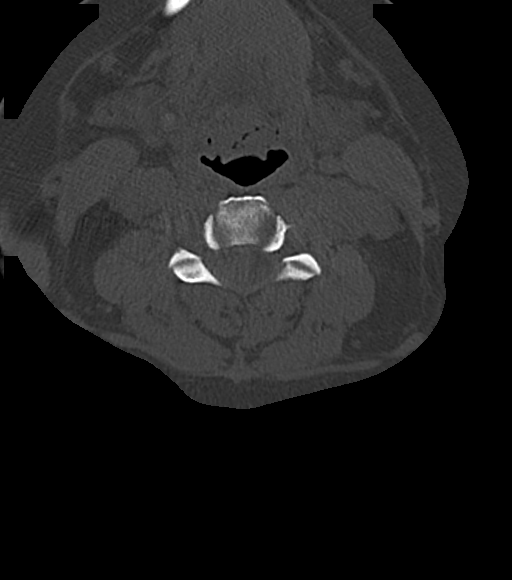
[im 80/100  bone]
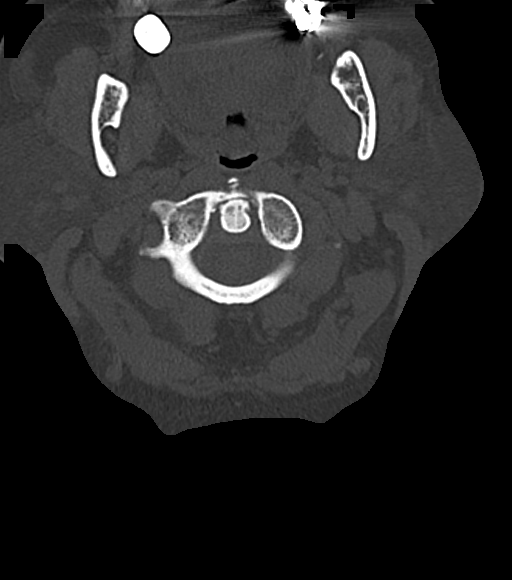

[15 of 33 positions shown; findings below may reference images not displayed]

FINDINGS: CT HEAD FINDINGS

No intracranial abnormalities are identified, including mass lesion
or mass effect, hydrocephalus, extra-axial fluid collection, midline
shift, hemorrhage, or acute infarction. The visualized bony
calvarium is unremarkable.

CT CERVICAL SPINE FINDINGS

There is no evidence of fracture, subluxation or prevertebral soft
tissue swelling.

Mild multilevel degenerative disc disease and spondylosis noted.

No bony lesions are present.

The soft tissues are unremarkable.
IMPRESSION: Unremarkable noncontrast head CT.

No static evidence of acute injury to the cervical spine.

## 2017-05-17 IMAGING — CR DG CHEST 2V
2 series · 2 of 2 positions shown · non-contrast
Comparison: 01/12/2016

CLINICAL DATA: Syncope today.  Not feeling well

EXAM:
CHEST  2 VIEW

[w chest pa]
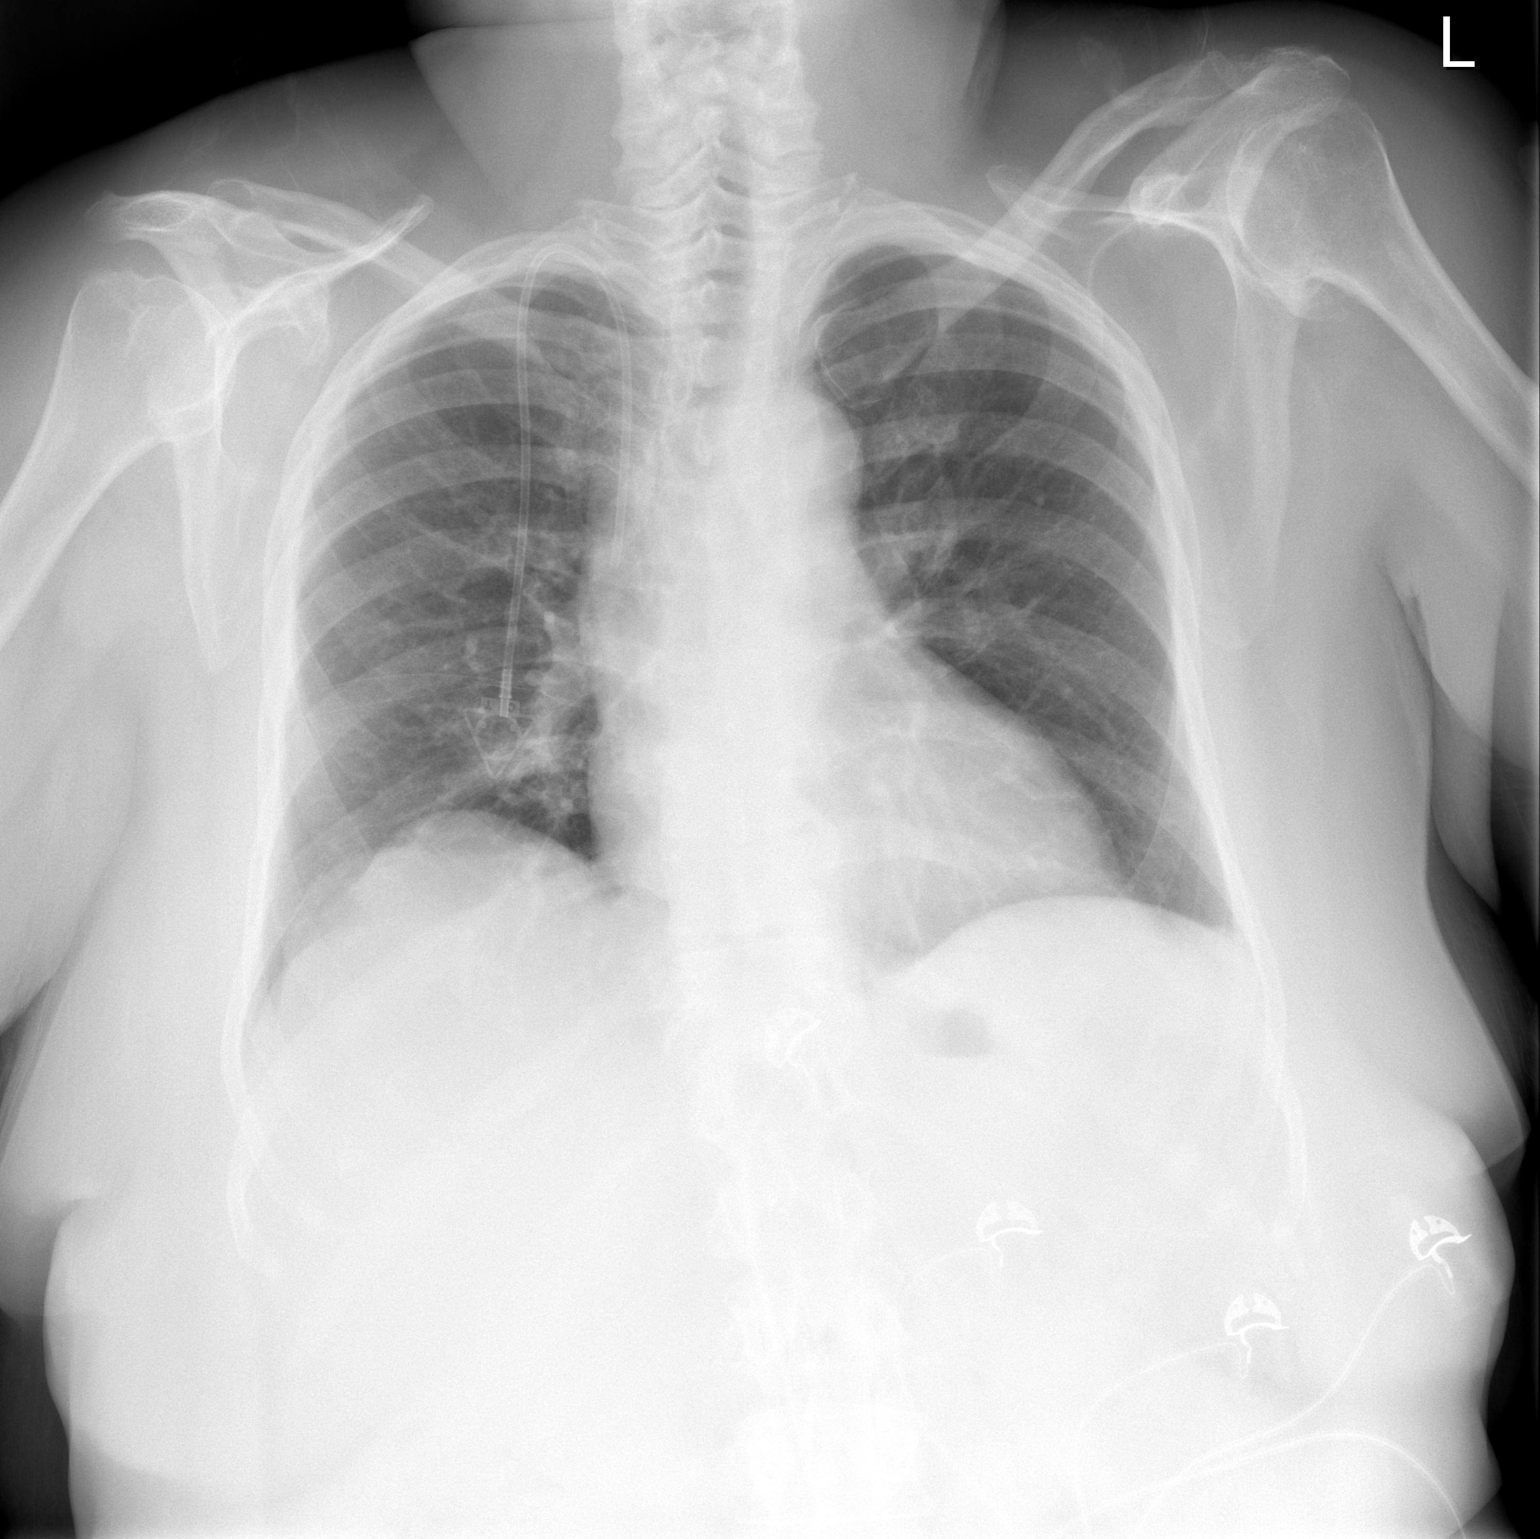

[w chest lat]
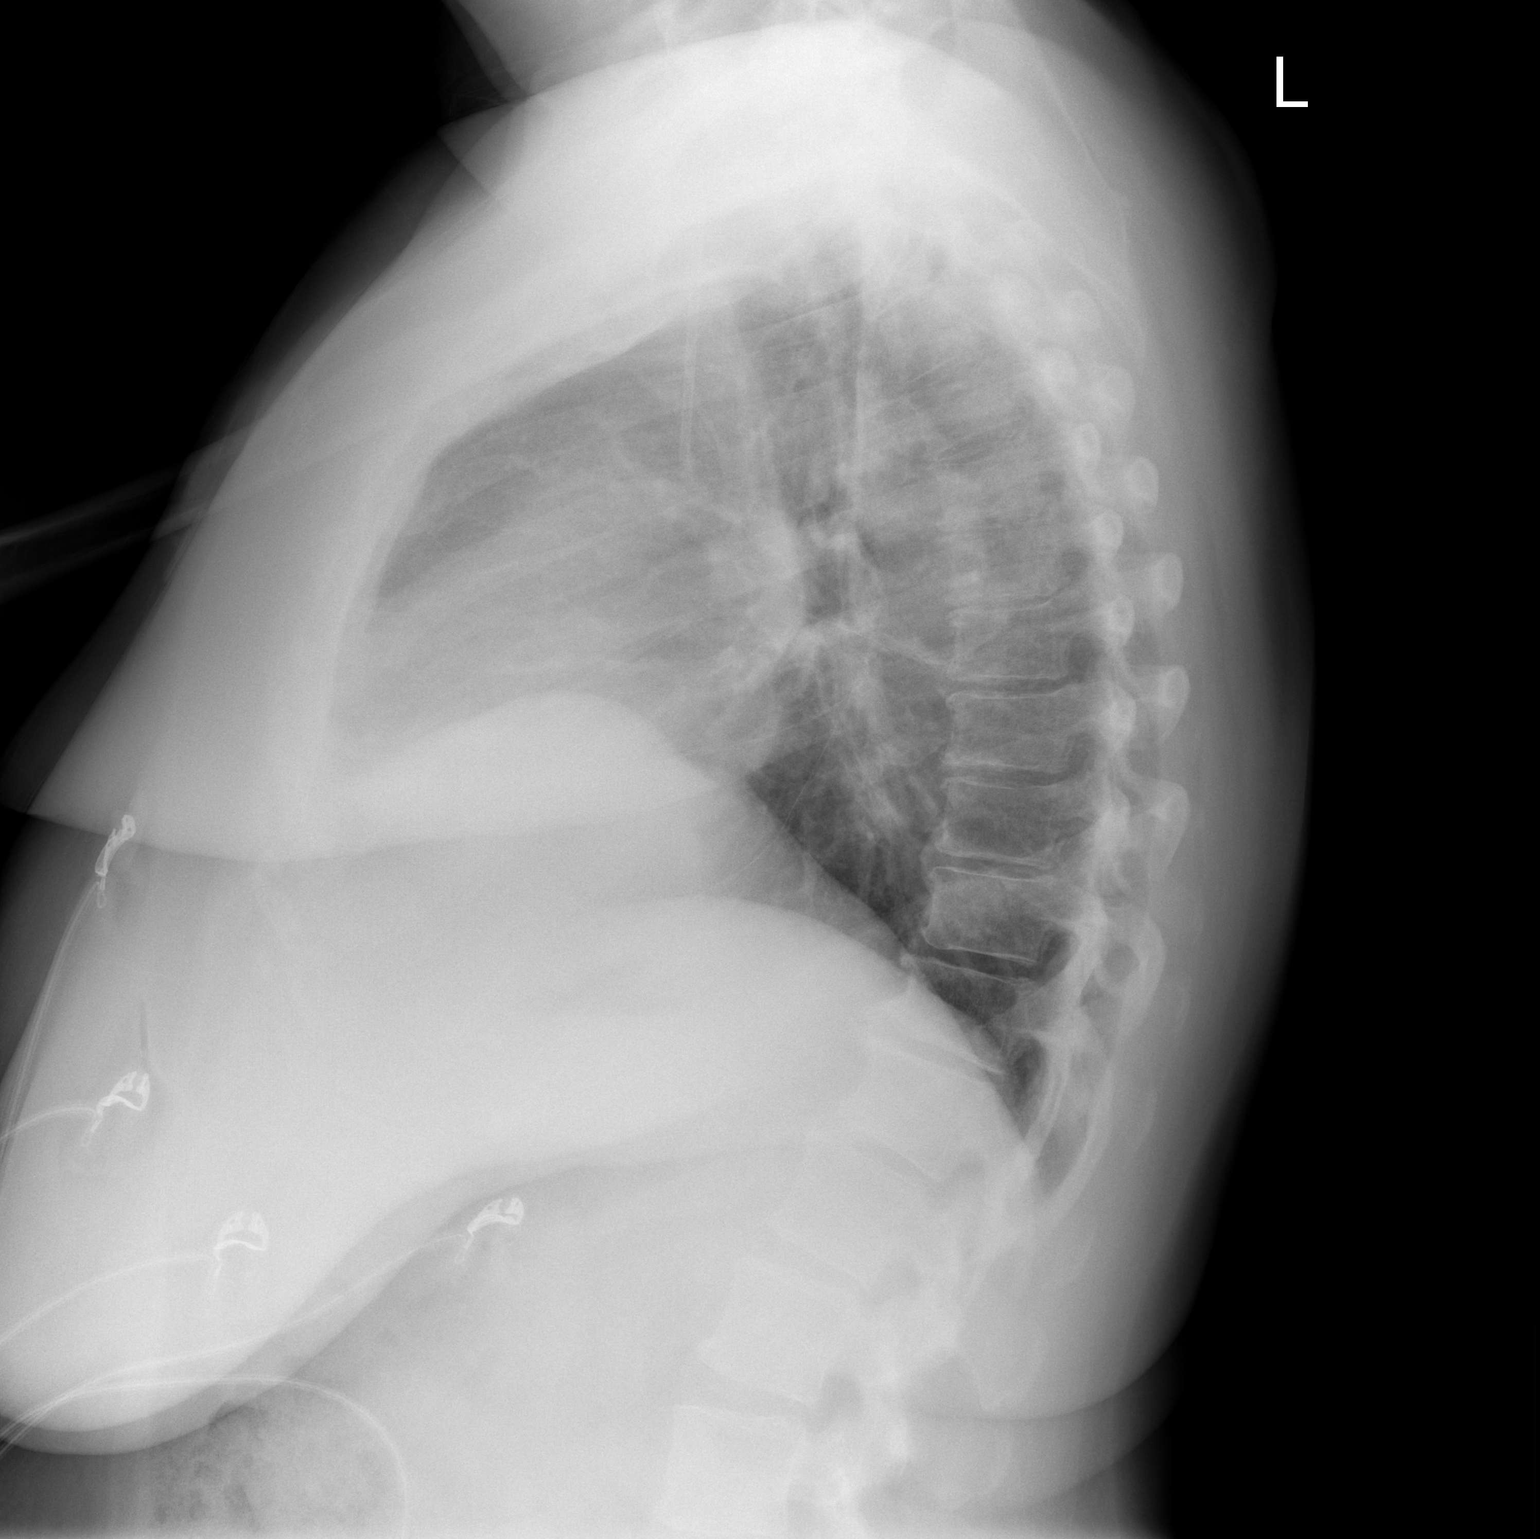

[2 of 2 positions shown; findings below may reference images not displayed]

FINDINGS: There is a right-sided Port-A-Cath with the tip projecting over the
SVC. There is no focal parenchymal opacity. There is no pleural
effusion or pneumothorax. The heart and mediastinal contours are
unremarkable.

The osseous structures are unremarkable.
IMPRESSION: No active cardiopulmonary disease.

## 2017-05-19 ENCOUNTER — Telehealth (INDEPENDENT_AMBULATORY_CARE_PROVIDER_SITE_OTHER): Payer: Self-pay | Admitting: Orthopedic Surgery

## 2017-05-19 NOTE — Telephone Encounter (Signed)
Advised patient that per MD, antibiotics not needed for prophylaxis dental procedure. Patient surgery was numerous years ago. If required by dentist we are more than happy to send in but from orthopedic standpoint not required.

## 2017-05-19 NOTE — Telephone Encounter (Signed)
Blasdell,[name] 1951/06/04 [phone]   [facility] [address]       Pt will see the dentist on Nov 13th the dentistry is requesting antibiotic. The dentistry need the antibiotic because of her knee and hip repayment.

## 2017-05-26 ENCOUNTER — Ambulatory Visit (INDEPENDENT_AMBULATORY_CARE_PROVIDER_SITE_OTHER): Payer: Medicare HMO | Admitting: Family Medicine

## 2017-05-26 ENCOUNTER — Encounter: Payer: Self-pay | Admitting: Family Medicine

## 2017-05-26 VITALS — BP 125/81 | HR 124 | Temp 100.0°F | Resp 16 | Ht 66.0 in | Wt 209.2 lb

## 2017-05-26 DIAGNOSIS — J101 Influenza due to other identified influenza virus with other respiratory manifestations: Secondary | ICD-10-CM | POA: Insufficient documentation

## 2017-05-26 DIAGNOSIS — E1151 Type 2 diabetes mellitus with diabetic peripheral angiopathy without gangrene: Secondary | ICD-10-CM | POA: Diagnosis not present

## 2017-05-26 DIAGNOSIS — R69 Illness, unspecified: Secondary | ICD-10-CM | POA: Diagnosis not present

## 2017-05-26 DIAGNOSIS — R509 Fever, unspecified: Secondary | ICD-10-CM | POA: Diagnosis not present

## 2017-05-26 DIAGNOSIS — G894 Chronic pain syndrome: Secondary | ICD-10-CM | POA: Diagnosis not present

## 2017-05-26 DIAGNOSIS — J4541 Moderate persistent asthma with (acute) exacerbation: Secondary | ICD-10-CM | POA: Insufficient documentation

## 2017-05-26 DIAGNOSIS — E1165 Type 2 diabetes mellitus with hyperglycemia: Secondary | ICD-10-CM | POA: Diagnosis not present

## 2017-05-26 DIAGNOSIS — R05 Cough: Secondary | ICD-10-CM

## 2017-05-26 DIAGNOSIS — R059 Cough, unspecified: Secondary | ICD-10-CM

## 2017-05-26 DIAGNOSIS — IMO0002 Reserved for concepts with insufficient information to code with codable children: Secondary | ICD-10-CM

## 2017-05-26 LAB — POCT INFLUENZA A/B
Influenza A, POC: POSITIVE — AB
Influenza B, POC: NEGATIVE

## 2017-05-26 MED ORDER — IPRATROPIUM-ALBUTEROL 0.5-2.5 (3) MG/3ML IN SOLN: 3.0000 mL | Freq: Four times a day (QID) | RESPIRATORY_TRACT | 2 refills | Status: DC | PRN

## 2017-05-26 MED ORDER — IPRATROPIUM-ALBUTEROL 0.5-2.5 (3) MG/3ML IN SOLN
3.0000 mL | Freq: Once | RESPIRATORY_TRACT | Status: AC
  Administered 2017-05-26: 3 mL via RESPIRATORY_TRACT

## 2017-05-26 MED ORDER — TRAMADOL HCL 50 MG PO TABS: 50.0000 mg | ORAL_TABLET | Freq: Four times a day (QID) | ORAL | 1 refills | Status: DC

## 2017-05-26 MED ORDER — OSELTAMIVIR PHOSPHATE 75 MG PO CAPS: 75.0000 mg | ORAL_CAPSULE | Freq: Two times a day (BID) | ORAL | 0 refills | Status: DC

## 2017-05-26 MED ORDER — IPRATROPIUM-ALBUTEROL 0.5-2.5 (3) MG/3ML IN SOLN: 3.0000 mL | Freq: Four times a day (QID) | RESPIRATORY_TRACT | Status: DC

## 2017-05-26 MED ORDER — FREESTYLE LIBRE READER DEVI: 1.0000 | Freq: Three times a day (TID) | 0 refills | Status: DC

## 2017-05-26 MED ORDER — METHYLPREDNISOLONE ACETATE 80 MG/ML IJ SUSP
80.0000 mg | Freq: Once | INTRAMUSCULAR | Status: AC
  Administered 2017-05-26: 80 mg via INTRAMUSCULAR

## 2017-05-26 MED ORDER — PREDNISONE 10 MG PO TABS: ORAL_TABLET | ORAL | 0 refills | Status: DC

## 2017-05-26 MED ORDER — FREESTYLE LIBRE SENSOR SYSTEM MISC: 5 refills | Status: DC

## 2017-05-26 NOTE — Progress Notes (Signed)
Patient ID: [name], female    DOB: 08-26-1950  Age: 66 y.o. MRN: [id number]    Subjective:  Subjective  HPI [name] presents for complaint of cough times several days with fever.  The cough is productive.  The patient is not taking any over-the-counter medication.  She also has not had her flu shot yet.  Review of Systems  Constitutional: Positive for chills and fever. Negative for appetite change, diaphoresis, fatigue and unexpected weight change.  Eyes: Negative for pain, redness and visual disturbance.  Respiratory: Positive for cough, shortness of breath and wheezing. Negative for chest tightness.   Cardiovascular: Negative for chest pain, palpitations and leg swelling.  Endocrine: Negative for cold intolerance, heat intolerance, polydipsia, polyphagia and polyuria.  Genitourinary: Negative for difficulty urinating, dysuria and frequency.  Musculoskeletal: Negative for arthralgias and myalgias.  Neurological: Negative for dizziness, light-headedness, numbness and headaches.    History Past Medical History:  Diagnosis Date  . Asthma   . Degenerative joint disease   . Diabetes mellitus   . Dyspnea   . GERD (gastroesophageal reflux disease)   . Hyperlipidemia    no longer on medication for this  . Hypertension    patient denies  . Neuropathy    bilateral hands and feet  . Sickle cell trait ([place])   . Sleep apnea   . stomach ca dx'd 2010   chemo/xrt comp 05/2009  . TIA (transient ischemic attack) 07/2015    She has a past surgical history that includes Total knee arthroplasty; Total hip arthroplasty; Shoulder surgery; Appendectomy; Cesarean section; Hernia repair; Esophagogastroduodenoscopy (N/A, 10/15/2012); Esophagogastroduodenoscopy (N/A, 01/15/2016); Colon surgery; and Colonoscopy with propofol (N/A, 06/01/2016).   Her family history includes Alzheimer's disease (age of onset: 70) in her father; Cancer in her maternal grandfather; Cancer (age of onset: 61) in her  mother; Diabetes in her brother and maternal grandmother.She reports that she quit smoking about 28 years ago. Her smoking use included Cigarettes. She started smoking about 43 years ago. She has a 15.00 pack-year smoking history. She has never used smokeless tobacco. She reports that she does not drink alcohol or use drugs.  Current Outpatient Prescriptions on File Prior to Visit  Medication Sig Dispense Refill  . albuterol (PROVENTIL HFA;VENTOLIN HFA) 108 (90 Base) MCG/ACT inhaler Inhale 2 puffs into the lungs every 6 (six) hours as needed for wheezing or shortness of breath. 1 Inhaler 2  . aspirin 81 MG tablet Take 81 mg by mouth at bedtime.    . Blood Glucose Calibration (OT ULTRA/FASTTK CNTRL SOLN) SOLN     . Blood Glucose Monitoring Suppl (ONE TOUCH ULTRA 2) w/Device KIT     . Blood Pressure Monitoring (B-D ASSURE BPM/MANUAL ARM CUFF) MISC Check blood pressure daily. 1 each 0  . DULoxetine (CYMBALTA) 60 MG capsule Take 1 capsule (60 mg total) by mouth 2 (two) times daily. 180 capsule 4  . EMBEDA 50-2 MG CPCR Take 1 capsule by mouth daily.    Marland Kitchen gabapentin (NEURONTIN) 400 MG capsule Take 2 capsules (800 mg total) by mouth 3 (three) times daily. 180 capsule 8  . glimepiride (AMARYL) 4 MG tablet Take 1 tablet (4 mg total) by mouth 2 (two) times daily. 60 tablet 4  . lidocaine (LIDODERM) 5 % Place 1 patch onto the skin daily. Remove & Discard patch within 12 hours or as directed by MD 30 patch 1  . metFORMIN (GLUCOPHAGE) 500 MG tablet Take 1 tablet (500 mg total) by mouth 2 (  two) times daily with a meal. 180 tablet 3  . Multiple Vitamin (MULTIVITAMIN WITH MINERALS) TABS tablet Take 1 tablet by mouth daily.    Marland Kitchen omeprazole (PRILOSEC) 20 MG capsule Take 1 capsule (20 mg total) by mouth daily. 14 capsule 0  . ondansetron (ZOFRAN ODT) 4 MG disintegrating tablet Take 1 tablet (4 mg total) by mouth every 8 (eight) hours as needed for nausea or vomiting. 20 tablet 0  . ONE TOUCH ULTRA TEST test strip       . sucralfate (CARAFATE) 1 GM/10ML suspension Take 10 mLs (1 g total) by mouth 4 (four) times daily -  with meals and at bedtime. 420 mL 0  . cyclobenzaprine (FLEXERIL) 5 MG tablet Take 1 tablet (5 mg total) by mouth 3 (three) times daily as needed for muscle spasms. (Patient not taking: Reported on 05/26/2017) 30 tablet 1   Current Facility-Administered Medications on File Prior to Visit  Medication Dose Route Frequency Provider Last Rate Last Dose  . sodium chloride flush (NS) 0.9 % injection 10 mL  10 mL Intravenous PRN [place], Holli Humbles, NP   10 mL at 09/13/16 1146     Objective:  Objective  Physical Exam  Constitutional: She is oriented to person, place, and time. She appears well-developed and well-nourished.  HENT:  Right Ear: External ear normal.  Left Ear: External ear normal.  Eyes: Conjunctivae are normal. Right eye exhibits no discharge. Left eye exhibits no discharge.  Cardiovascular: Normal rate, regular rhythm and normal heart sounds.   No murmur heard. Pulmonary/Chest: Effort normal. No respiratory distress. She has wheezes. She has no rales. She exhibits no tenderness.  Positive for expiratory wheezes bilaterally.  Nebulizer was given with DuoNeb with improvement of airflow the patientis  still having wheezing  Musculoskeletal: She exhibits no edema.  Lymphadenopathy:    She has no cervical adenopathy.  Neurological: She is alert and oriented to person, place, and time.  Nursing note and vitals reviewed.  BP 125/81 (BP Location: Right Arm, Cuff Size: Normal)   Pulse (!) 124   Temp 100 F (37.8 C) (Oral)   Resp 16   Ht 5' 6"  (1.676 m)   Wt 209 lb 3.2 oz (94.9 kg)   SpO2 93%   BMI 33.77 kg/m  Wt Readings from Last 3 Encounters:  05/26/17 209 lb 3.2 oz (94.9 kg)  03/07/17 219 lb (99.3 kg)  02/22/17 216 lb (98 kg)     Lab Results  Component Value Date   WBC 8.3 12/30/2016   HGB 13.1 12/30/2016   HCT 38.3 12/30/2016   PLT 344 12/30/2016   GLUCOSE 201 (H)  12/30/2016   CHOL 149 12/27/2016   TRIG 93.0 12/27/2016   HDL 56.80 12/27/2016   LDLCALC 73 12/27/2016   ALT 21 12/30/2016   AST 23 12/30/2016   NA 142 12/30/2016   K 3.7 12/30/2016   CL 109 12/30/2016   CREATININE 1.31 (H) 12/30/2016   BUN 16 12/30/2016   CO2 22 12/30/2016   TSH 3.061 05/13/2016   INR 0.98 11/05/2010   HGBA1C 8.8 (H) 12/27/2016   MICROALBUR 0.7 06/21/2016    US Soft Tissue Head And Neck  Result Date: 02/21/2017 CLINICAL DATA:  Prior ultrasound follow-up. Follow-up multinodular goiter. EXAM: THYROID ULTRASOUND TECHNIQUE: Ultrasound examination of the thyroid gland and adjacent soft tissues was performed. COMPARISON:  09/21/2015 FINDINGS: Parenchymal Echotexture: Moderately heterogenous Isthmus: 0.2 cm, previously 0.2 cm Right lobe: 4.0 x 2.1 x 2.4 cm, previously 4.4 x  2.5 x 2.5 cm Left lobe: 5.8 x 2.2 x 2.0 cm, previously 5.2 x 1.6 x 2.0 cm _________________________________________________________ Estimated total number of nodules >/= 1 cm: 6-10 Number of spongiform nodules >/=  2 cm not described below (TR1): 0 Number of mixed cystic and solid nodules >/= 1.5 cm not described below ([place]): 0 _________________________________________________________ Nodule # 1: Prior biopsy: No Location: Right; Superior Maximum size: 1.2 cm; Other 2 dimensions: 1.1 x 1.0 cm, previously, 1.3 x 1.2 x 1.0 cm Composition: solid/almost completely solid (2) Echogenicity: hypoechoic (2) Shape: not taller-than-wide (0) Margins: smooth (0) Echogenic foci: macrocalcifications (1) ACR TI-RADS total points: 5. ACR TI-RADS risk category:  TR4 (4-6 points). Significant change in size (>/= 20% in two dimensions and minimal increase of 2 mm): No Change in features: No Change in ACR TI-RADS risk category: No ACR TI-RADS recommendations: *Given size (>/= 1 - 1.4 cm) and appearance, a follow-up ultrasound in 1 year should be considered based on TI-RADS criteria.  _________________________________________________________ Nodule # 2: Prior biopsy: No Location: Right; Mid Maximum size: 2.6 cm; Other 2 dimensions: 2.2 x 1.8 cm, previously, 2.5 x 1.8 x 2.2 cm Composition: solid/almost completely solid (2) Echogenicity: hypoechoic (2) Shape: not taller-than-wide (0) Margins: smooth (0) Echogenic foci: none (0) ACR TI-RADS total points: 4. ACR TI-RADS risk category:  TR4 (4-6 points). Significant change in size (>/= 20% in two dimensions and minimal increase of 2 mm): No Change in features: No Change in ACR TI-RADS risk category: No ACR TI-RADS recommendations: **Given size (>/= 1.5 cm) and appearance, fine needle aspiration of this moderately suspicious nodule should be considered based on TI-RADS criteria. _________________________________________________________ Nodule # 3: Prior biopsy: No Location: Right; Inferior Maximum size: 1.2 cm; Other 2 dimensions: 0.6 x 0.9 cm, previously, 1.1 x 1.0 x 0.9 cm Composition: solid/almost completely solid (2) Echogenicity: hyperechoic (1) Shape: not taller-than-wide (0) Margins: ill-defined (0) Echogenic foci: none (0) ACR TI-RADS total points: 3. ACR TI-RADS risk category:  TR3 (3 points). Significant change in size (>/= 20% in two dimensions and minimal increase of 2 mm): No Change in features: No Change in ACR TI-RADS risk category: No ACR TI-RADS recommendations: Given size (<1.4 cm) and appearance, this nodule does NOT meet TI-RADS criteria for biopsy or dedicated follow-up. _________________________________________________________ Nodule # 4: Prior biopsy: No Location: Left; Mid Maximum size: 2.7 cm; Other 2 dimensions: 1.4 x 1.9 cm, previously, 2.9 x 1.7 x 1.5 cm Composition: solid/almost completely solid (2) Echogenicity: hypoechoic (2) Shape: not taller-than-wide (0) Margins: smooth (0) Echogenic foci: none (0) ACR TI-RADS total points: 4. ACR TI-RADS risk category:  TR4 (4-6 points). Significant change in size (>/= 20% in two  dimensions and minimal increase of 2 mm): No Change in features: No Change in ACR TI-RADS risk category: No ACR TI-RADS recommendations: **Given size (>/= 1.5 cm) and appearance, fine needle aspiration of this moderately suspicious nodule should be considered based on TI-RADS criteria. _________________________________________________________ Nodule # 5: Prior biopsy: No Location: Left; Inferior Maximum size: 1.7 cm; Other 2 dimensions: 1.3 x 1.3 cm, previously, 1.8 x 1.4 x 1.5 cm Composition: solid/almost completely solid (2) Echogenicity: hypoechoic (2) Shape: not taller-than-wide (0) Margins: smooth (0) Echogenic foci: none (0) ACR TI-RADS total points: 4. ACR TI-RADS risk category:  TR4 (4-6 points). Significant change in size (>/= 20% in two dimensions and minimal increase of 2 mm): No Change in features: No Change in ACR TI-RADS risk category: No ACR TI-RADS recommendations: **Given size (>/= 1.5 cm) and appearance,  fine needle aspiration of this moderately suspicious nodule should be considered based on TI-RADS criteria. _________________________________________________________ IMPRESSION: Multiple bilateral nodules are all stable in appearance and size. Nodules 2, 4, and 5 continue to meet criteria for biopsy. Nodule 1 meets criteria for annual follow-up. The above is in keeping with the ACR TI-RADS recommendations - J Am Coll Radiol 2017;14:587-595. Electronically Signed   By: Marybelle Killings M.D.   On: 02/21/2017 13:18     Assessment & Plan:  Plan  I am having Ms. Dickens start on oseltamivir, ipratropium-albuterol, predniSONE, FREESTYLE LIBRE READER, and [place]. I am also having her maintain her multivitamin with minerals, aspirin, B-D ASSURE BPM/MANUAL ARM CUFF, OT ULTRA/FASTTK CNTRL SOLN, ONE TOUCH ULTRA 2, ONE TOUCH ULTRA TEST, gabapentin, DULoxetine, EMBEDA, sucralfate, omeprazole, ondansetron, metFORMIN, cyclobenzaprine, lidocaine, albuterol, glimepiride, and traMADol. We  administered ipratropium-albuterol and methylPREDNISolone acetate.  Meds ordered this encounter  Medications  . DISCONTD: ipratropium-albuterol (DUONEB) 0.5-2.5 (3) MG/3ML nebulizer solution 3 mL  . ipratropium-albuterol (DUONEB) 0.5-2.5 (3) MG/3ML nebulizer solution 3 mL  . oseltamivir (TAMIFLU) 75 MG capsule    Sig: Take 1 capsule (75 mg total) by mouth 2 (two) times daily.    Dispense:  10 capsule    Refill:  0  . ipratropium-albuterol (DUONEB) 0.5-2.5 (3) MG/3ML SOLN    Sig: Take 3 mLs by nebulization every 6 (six) hours as needed.    Dispense:  360 mL    Refill:  2  . predniSONE (DELTASONE) 10 MG tablet    Sig: TAKE 3 TABLETS PO QD FOR 3 DAYS THEN TAKE 2 TABLETS PO QD FOR 3 DAYS THEN TAKE 1 TABLET PO QD FOR 3 DAYS THEN TAKE 1/2 TAB PO QD FOR 3 DAYS    Dispense:  20 tablet    Refill:  0  . methylPREDNISolone acetate (DEPO-MEDROL) injection 80 mg  . traMADol (ULTRAM) 50 MG tablet    Sig: Take 1 tablet (50 mg total) by mouth 4 (four) times daily.    Dispense:  120 tablet    Refill:  1  . Continuous Blood Gluc Receiver (FREESTYLE LIBRE READER) DEVI    Sig: 1 Device by Does not apply route 3 (three) times daily.    Dispense:  1 Device    Refill:  0  . Continuous Blood Gluc Sensor (FREESTYLE LIBRE SENSOR SYSTEM) MISC    Sig: 1 sensor every 10 days    Dispense:  4 each    Refill:  5    Problem List Items Addressed This Visit      Unprioritized   Chronic pain   Relevant Medications   predniSONE (DELTASONE) 10 MG tablet   methylPREDNISolone acetate (DEPO-MEDROL) injection 80 mg (Completed)   traMADol (ULTRAM) 50 MG tablet   Influenza A - Primary    tamiflu rx Pt is contagious until fever free 24 hours rto prn       Relevant Medications   oseltamivir (TAMIFLU) 75 MG capsule   Moderate persistent asthma with acute exacerbation    Neb given in office Med for neb rx depomedrol im  pred taper rto prn       Relevant Medications   ipratropium-albuterol (DUONEB) 0.5-2.5  (3) MG/3ML nebulizer solution 3 mL (Completed)   ipratropium-albuterol (DUONEB) 0.5-2.5 (3) MG/3ML SOLN   predniSONE (DELTASONE) 10 MG tablet   methylPREDNISolone acetate (DEPO-MEDROL) injection 80 mg (Completed)   Other Relevant Orders   DG Chest 2 View    Other Visit Diagnoses  Fever, unspecified fever cause       Relevant Orders   POCT Influenza A/B (Completed)   Cough       Relevant Medications   ipratropium-albuterol (DUONEB) 0.5-2.5 (3) MG/3ML nebulizer solution 3 mL (Completed)   DM (diabetes mellitus) type II uncontrolled, periph vascular disorder (HCC)       Relevant Medications   Continuous Blood Gluc Receiver (FREESTYLE LIBRE READER) DEVI   Continuous Blood Gluc Sensor (FREESTYLE LIBRE SENSOR SYSTEM) MISC      Follow-up: Return in about 2 weeks (around 06/09/2017), or if symptoms worsen or fail to improve.  Ann Held, DO

## 2017-05-26 NOTE — Patient Instructions (Addendum)
Sliding scale 200-250     2u 251-300  4u  301-350  6u 351-400  8u  >400  10 u -- call dr office    Acute Bronchitis, Adult Acute bronchitis is when air tubes (bronchi) in the lungs suddenly get swollen. The condition can make it hard to breathe. It can also cause these symptoms:  A cough.  Coughing up clear, yellow, or green mucus.  Wheezing.  Chest congestion.  Shortness of breath.  A fever.  Body aches.  Chills.  A sore throat.  Follow these instructions at home: Medicines  Take over-the-counter and prescription medicines only as told by your doctor.  If you were prescribed an antibiotic medicine, take it as told by your doctor. Do not stop taking the antibiotic even if you start to feel better. General instructions  Rest.  Drink enough fluids to keep your pee (urine) clear or pale yellow.  Avoid smoking and secondhand smoke. If you smoke and you need help quitting, ask your doctor. Quitting will help your lungs heal faster.  Use an inhaler, cool mist vaporizer, or humidifier as told by your doctor.  Keep all follow-up visits as told by your doctor. This is important. How is this prevented? To lower your risk of getting this condition again:  Wash your hands often with soap and water. If you cannot use soap and water, use hand sanitizer.  Avoid contact with people who have cold symptoms.  Try not to touch your hands to your mouth, nose, or eyes.  Make sure to get the flu shot every year.  Contact a doctor if:  Your symptoms do not get better in 2 weeks. Get help right away if:  You cough up blood.  You have chest pain.  You have very bad shortness of breath.  You become dehydrated.  You faint (pass out) or keep feeling like you are going to pass out.  You keep throwing up (vomiting).  You have a very bad headache.  Your fever or chills gets worse. This information is not intended to replace advice given to you by your health care  provider. Make sure you discuss any questions you have with your health care provider. Document Released: 12/28/2007 Document Revised: 02/17/2016 Document Reviewed: 12/30/2015 Elsevier Interactive Patient Education  2017 [place].  Influenza, Adult Influenza, more commonly known as "the flu," is a viral infection that primarily affects the respiratory tract. The respiratory tract includes organs that help you breathe, such as the lungs, nose, and throat. The flu causes many common cold symptoms, as well as a high fever and body aches. The flu spreads easily from person to person (is contagious). Getting a flu shot (influenza vaccination) every year is the best way to prevent influenza. What are the causes? Influenza is caused by a virus. You can catch the virus by:  Breathing in droplets from an infected person's cough or sneeze.  Touching something that was recently contaminated with the virus and then touching your mouth, nose, or eyes.  What increases the risk? The following factors may make you more likely to get the flu:  Not cleaning your hands frequently with soap and water or alcohol-based hand sanitizer.  Having close contact with many people during cold and flu season.  Touching your mouth, eyes, or nose without washing or sanitizing your hands first.  Not drinking enough fluids or not eating a healthy diet.  Not getting enough sleep or exercise.  Being under a high amount of stress.  Not getting a yearly (annual) flu shot.  You may be at a higher risk of complications from the flu, such as a severe lung infection (pneumonia), if you:  Are over the age of 72.  Are pregnant.  Have a weakened disease-fighting system (immune system). You may have a weakened immune system if you: ? Have HIV or AIDS. ? Are undergoing chemotherapy. ? Aretaking medicines that reduce the activity of (suppress) the immune system.  Have a long-term (chronic) illness, such as heart  disease, kidney disease, diabetes, or lung disease.  Have a liver disorder.  Are obese.  Have anemia.  What are the signs or symptoms? Symptoms of this condition typically last 4-10 days and may include:  Fever.  Chills.  Headache, body aches, or muscle aches.  Sore throat.  Cough.  Runny or congested nose.  Chest discomfort and cough.  Poor appetite.  Weakness or tiredness (fatigue).  Dizziness.  Nausea or vomiting.  How is this diagnosed? This condition may be diagnosed based on your medical history and a physical exam. Your health care provider may do a nose or throat swab test to confirm the diagnosis. How is this treated? If influenza is detected early, you can be treated with antiviral medicine that can reduce the length of your illness and the severity of your symptoms. This medicine may be given by mouth (orally) or through an IV tube that is inserted in one of your veins. The goal of treatment is to relieve symptoms by taking care of yourself at home. This may include taking over-the-counter medicines, drinking plenty of fluids, and adding humidity to the air in your home. In some cases, influenza goes away on its own. Severe influenza or complications from influenza may be treated in a hospital. Follow these instructions at home:  Take over-the-counter and prescription medicines only as told by your health care provider.  Use a cool mist humidifier to add humidity to the air in your home. This can make breathing easier.  Rest as needed.  Drink enough fluid to keep your urine clear or pale yellow.  Cover your mouth and nose when you cough or sneeze.  Wash your hands with soap and water often, especially after you cough or sneeze. If soap and water are not available, use hand sanitizer.  Stay home from work or school as told by your health care provider. Unless you are visiting your health care provider, try to avoid leaving home until your fever has been  gone for 24 hours without the use of medicine.  Keep all follow-up visits as told by your health care provider. This is important. How is this prevented?  Getting an annual flu shot is the best way to avoid getting the flu. You may get the flu shot in late summer, fall, or winter. Ask your health care provider when you should get your flu shot.  Wash your hands often or use hand sanitizer often.  Avoid contact with people who are sick during cold and flu season.  Eat a healthy diet, drink plenty of fluids, get enough sleep, and exercise regularly. Contact a health care provider if:  You develop new symptoms.  You have: ? Chest pain. ? Diarrhea. ? A fever.  Your cough gets worse.  You produce more mucus.  You feel nauseous or you vomit. Get help right away if:  You develop shortness of breath or difficulty breathing.  Your skin or nails turn a bluish color.  You have severe pain  or stiffness in your neck.  You develop a sudden headache or sudden pain in your face or ear.  You cannot stop vomiting. This information is not intended to replace advice given to you by your health care provider. Make sure you discuss any questions you have with your health care provider. Document Released: 07/08/2000 Document Revised: 12/17/2015 Document Reviewed: 05/05/2015 Elsevier Interactive Patient Education  2017 [employer].

## 2017-05-26 NOTE — Assessment & Plan Note (Signed)
Neb given in office Med for neb rx depomedrol im  pred taper rto prn

## 2017-05-26 NOTE — Assessment & Plan Note (Signed)
tamiflu rx Pt is contagious until fever free 24 hours rto prn

## 2017-05-30 ENCOUNTER — Ambulatory Visit (HOSPITAL_BASED_OUTPATIENT_CLINIC_OR_DEPARTMENT_OTHER)
Admission: RE | Admit: 2017-05-30 | Discharge: 2017-05-30 | Disposition: A | Discharge status: Home / Self Care | Payer: Medicare HMO | Source: Ambulatory Visit | Attending: Internal Medicine | Admitting: Internal Medicine

## 2017-05-30 ENCOUNTER — Ambulatory Visit (HOSPITAL_BASED_OUTPATIENT_CLINIC_OR_DEPARTMENT_OTHER)
Admission: RE | Admit: 2017-05-30 | Discharge: 2017-05-30 | Disposition: A | Discharge status: Home / Self Care | Payer: Medicare HMO | Source: Ambulatory Visit | Attending: Family Medicine | Admitting: Family Medicine

## 2017-05-30 DIAGNOSIS — J4541 Moderate persistent asthma with (acute) exacerbation: Secondary | ICD-10-CM | POA: Diagnosis present

## 2017-05-30 DIAGNOSIS — M25511 Pain in right shoulder: Secondary | ICD-10-CM | POA: Diagnosis not present

## 2017-05-30 DIAGNOSIS — R05 Cough: Secondary | ICD-10-CM | POA: Diagnosis not present

## 2017-06-02 ENCOUNTER — Other Ambulatory Visit: Payer: Self-pay | Admitting: Family Medicine

## 2017-06-02 DIAGNOSIS — Z1231 Encounter for screening mammogram for malignant neoplasm of breast: Secondary | ICD-10-CM

## 2017-06-05 IMAGING — CR DG CHEST 2V
2 series · 2 of 2 positions shown · non-contrast
Comparison: PA and lateral chest x-ray February 03, 2016

CLINICAL DATA: Cough and left-sided shoulder pain for the past
month, former smoker.

EXAM:
CHEST  2 VIEW

[w chest pa]
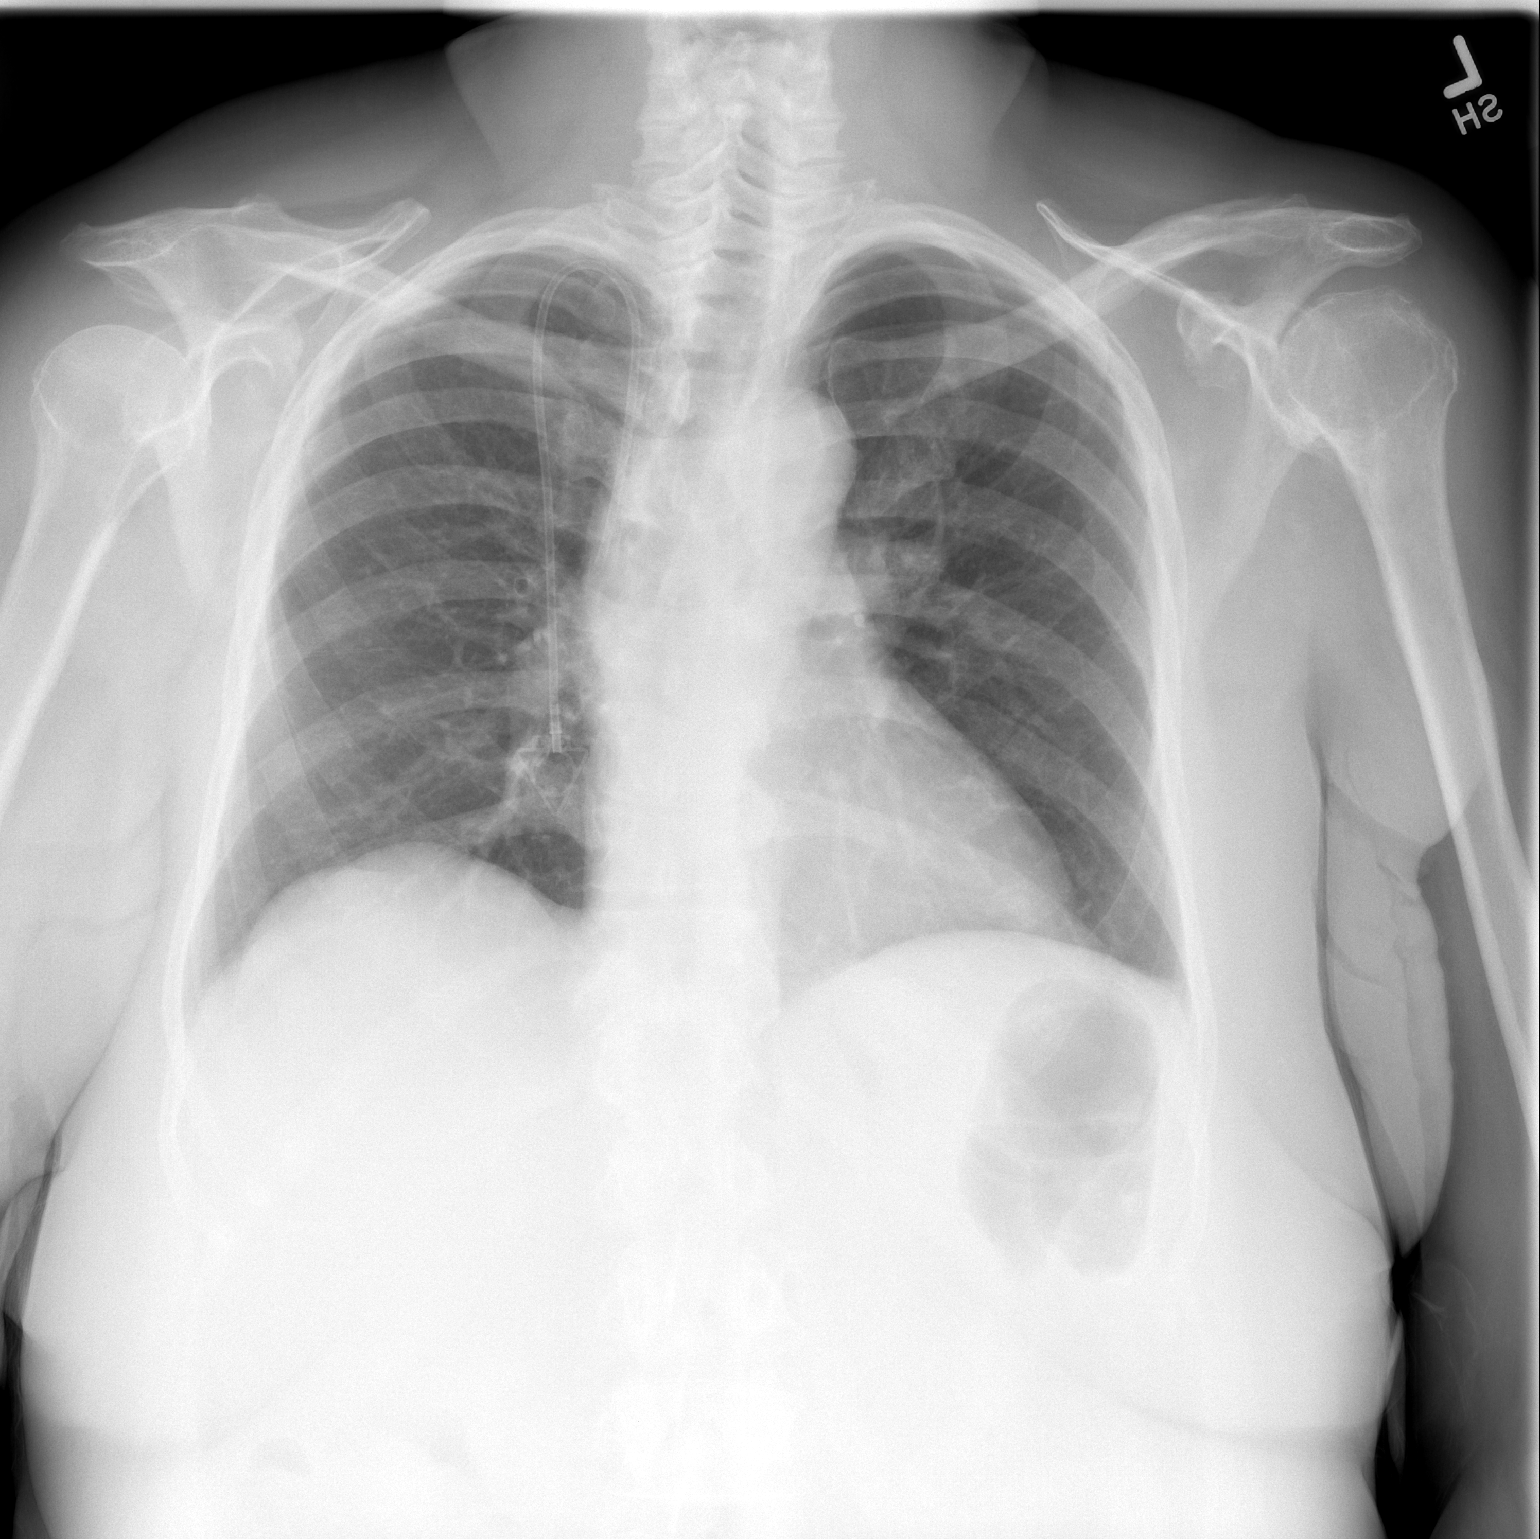

[w chest lat]
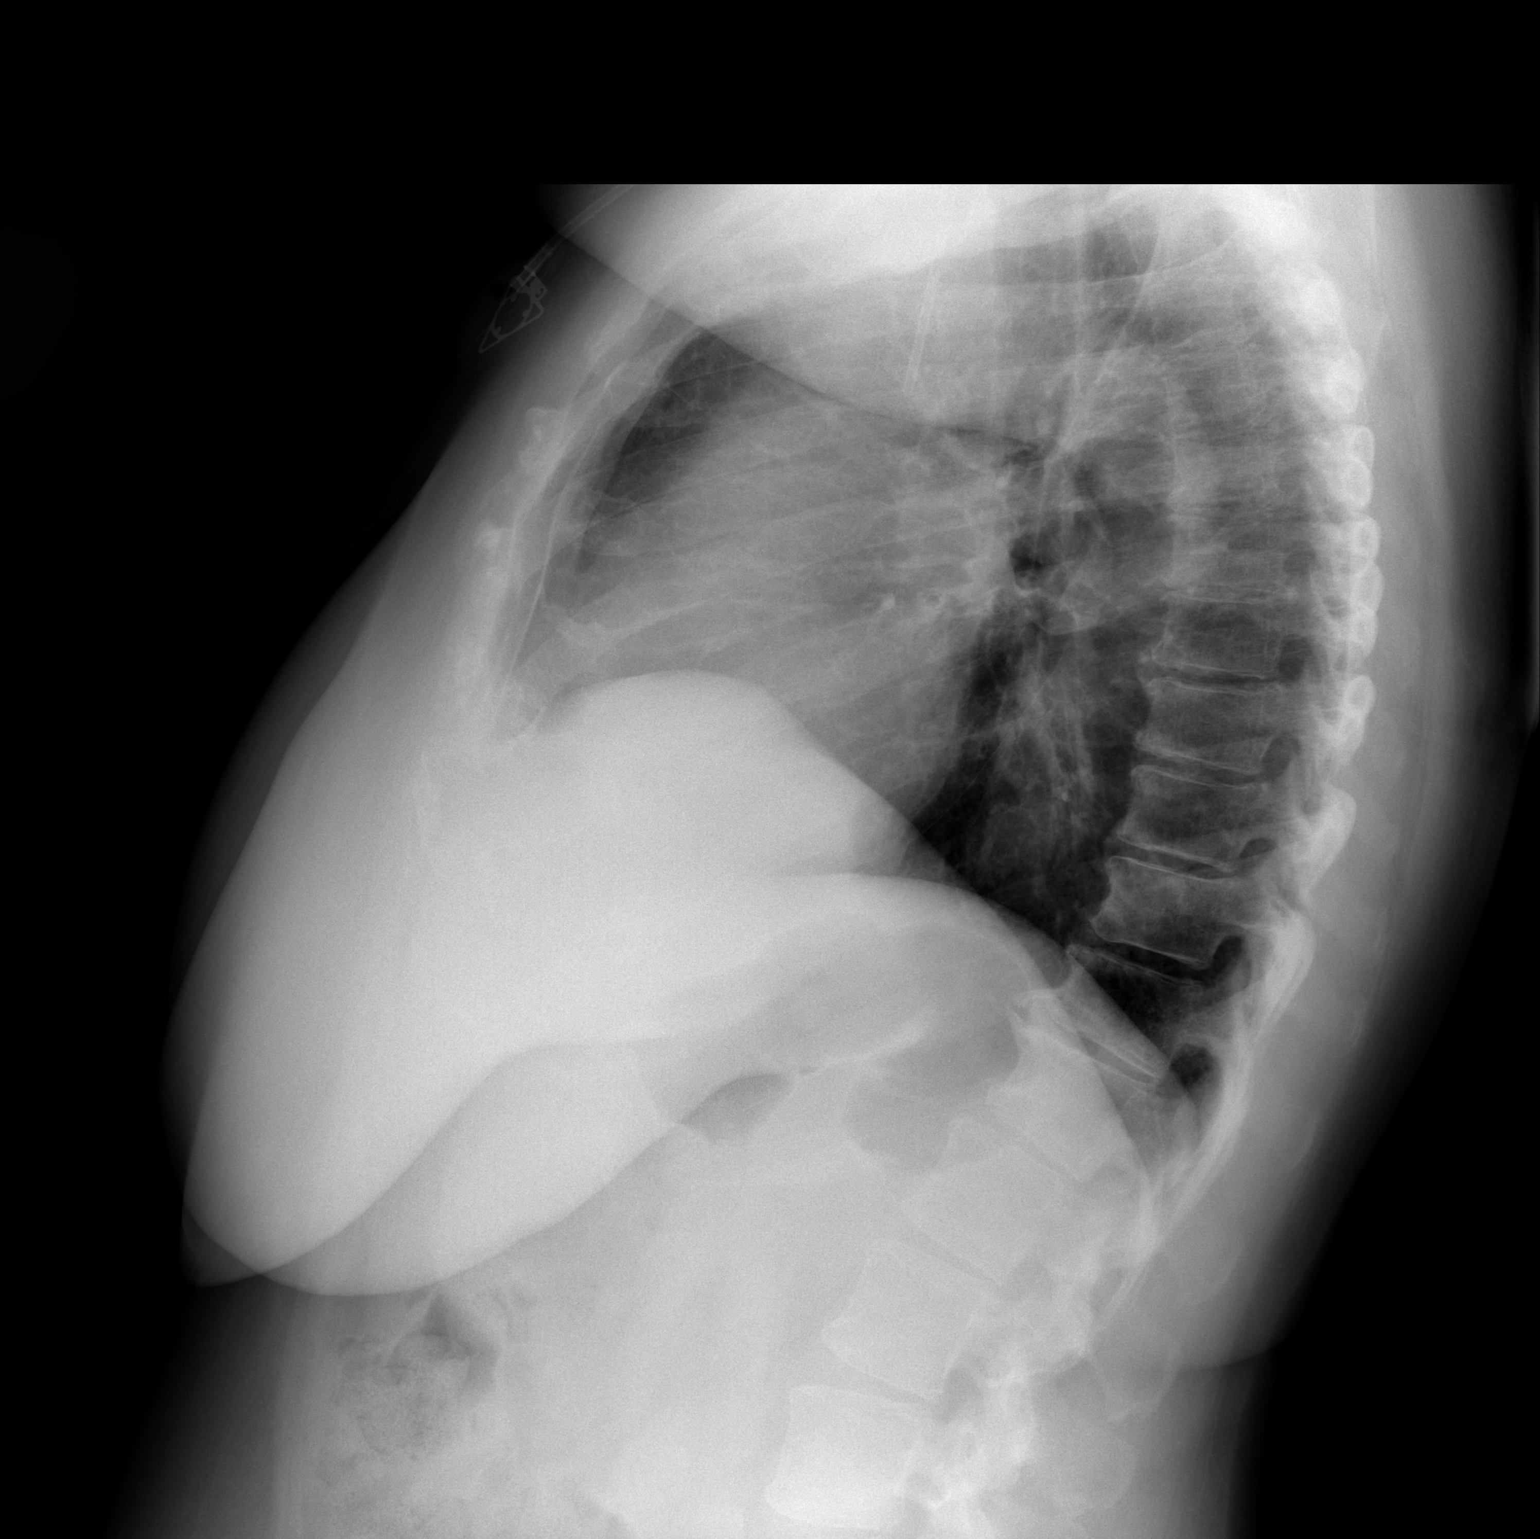

[2 of 2 positions shown; findings below may reference images not displayed]

FINDINGS: The lungs are adequately inflated. There is no focal infiltrate.
There is no pleural effusion. The heart and pulmonary vascularity
are normal. The mediastinum is normal in width. The power port
appliance catheter tip projects over the junction of the proximal
and midportions of the SVC. The bony thorax exhibits no acute
abnormality.
IMPRESSION: There is no active cardiopulmonary disease.

## 2017-06-09 ENCOUNTER — Ambulatory Visit (INDEPENDENT_AMBULATORY_CARE_PROVIDER_SITE_OTHER): Payer: Medicare HMO | Admitting: Family Medicine

## 2017-06-09 ENCOUNTER — Encounter: Payer: Self-pay | Admitting: Family Medicine

## 2017-06-09 VITALS — BP 122/78 | HR 108 | Temp 98.6°F | Wt 208.4 lb

## 2017-06-09 DIAGNOSIS — K047 Periapical abscess without sinus: Secondary | ICD-10-CM

## 2017-06-09 DIAGNOSIS — J4540 Moderate persistent asthma, uncomplicated: Secondary | ICD-10-CM | POA: Diagnosis not present

## 2017-06-09 DIAGNOSIS — J101 Influenza due to other identified influenza virus with other respiratory manifestations: Secondary | ICD-10-CM

## 2017-06-09 MED ORDER — CLINDAMYCIN HCL 300 MG PO CAPS: 300.0000 mg | ORAL_CAPSULE | Freq: Three times a day (TID) | ORAL | 0 refills | Status: DC

## 2017-06-09 NOTE — Progress Notes (Signed)
Patient ID: [name], female    DOB: 1950-12-08  Age: 66 y.o. MRN: [id number]    Subjective:  Subjective  HPI [name] presents for f/u asthma-- she is doing better but now has problems with a tooth.  She is waiting to get an appointment with a dentist.    Review of Systems  Constitutional: Negative for appetite change, diaphoresis, fatigue and unexpected weight change.  HENT: Positive for dental problem. Negative for congestion, ear pain and facial swelling.   Eyes: Negative for pain, redness and visual disturbance.  Respiratory: Negative for cough, chest tightness, shortness of breath and wheezing.   Cardiovascular: Negative for chest pain, palpitations and leg swelling.  Endocrine: Negative for cold intolerance, heat intolerance, polydipsia, polyphagia and polyuria.  Genitourinary: Negative for difficulty urinating, dysuria and frequency.  Neurological: Negative for dizziness, light-headedness, numbness and headaches.    History Past Medical History:  Diagnosis Date  . Asthma   . Degenerative joint disease   . Diabetes mellitus   . Dyspnea   . GERD (gastroesophageal reflux disease)   . Hyperlipidemia    no longer on medication for this  . Hypertension    patient denies  . Neuropathy    bilateral hands and feet  . Sickle cell trait ([place])   . Sleep apnea   . stomach ca dx'd 2010   chemo/xrt comp 05/2009  . TIA (transient ischemic attack) 07/2015    She has a past surgical history that includes Total knee arthroplasty; Total hip arthroplasty; Shoulder surgery; Appendectomy; Cesarean section; Hernia repair; Esophagogastroduodenoscopy (N/A, 10/15/2012); Esophagogastroduodenoscopy (N/A, 01/15/2016); Colon surgery; and Colonoscopy with propofol (N/A, 06/01/2016).   Her family history includes Alzheimer's disease (age of onset: 16) in her father; Cancer in her maternal grandfather; Cancer (age of onset: 69) in her mother; Diabetes in her brother and maternal grandmother.She  reports that she quit smoking about 28 years ago. Her smoking use included cigarettes. She started smoking about 43 years ago. She has a 15.00 pack-year smoking history. she has never used smokeless tobacco. She reports that she does not drink alcohol or use drugs.  Current Outpatient Medications on File Prior to Visit  Medication Sig Dispense Refill  . aspirin 81 MG tablet Take 81 mg by mouth at bedtime.    . Blood Glucose Calibration (OT ULTRA/FASTTK CNTRL SOLN) SOLN     . Blood Glucose Monitoring Suppl (ONE TOUCH ULTRA 2) w/Device KIT     . Blood Pressure Monitoring (B-D ASSURE BPM/MANUAL ARM CUFF) MISC Check blood pressure daily. 1 each 0  . Continuous Blood Gluc Receiver (FREESTYLE LIBRE READER) DEVI 1 Device by Does not apply route 3 (three) times daily. 1 Device 0  . Continuous Blood Gluc Sensor (FREESTYLE LIBRE SENSOR SYSTEM) MISC 1 sensor every 10 days 4 each 5  . cyclobenzaprine (FLEXERIL) 5 MG tablet Take 1 tablet (5 mg total) by mouth 3 (three) times daily as needed for muscle spasms. 30 tablet 1  . DULoxetine (CYMBALTA) 60 MG capsule Take 1 capsule (60 mg total) by mouth 2 (two) times daily. 180 capsule 4  . EMBEDA 50-2 MG CPCR Take 1 capsule by mouth daily.    Marland Kitchen gabapentin (NEURONTIN) 400 MG capsule Take 2 capsules (800 mg total) by mouth 3 (three) times daily. 180 capsule 8  . glimepiride (AMARYL) 4 MG tablet Take 1 tablet (4 mg total) by mouth 2 (two) times daily. 60 tablet 4  . ipratropium-albuterol (DUONEB) 0.5-2.5 (3) MG/3ML SOLN Take 3 mLs  by nebulization every 6 (six) hours as needed. 360 mL 2  . lidocaine (LIDODERM) 5 % Place 1 patch onto the skin daily. Remove & Discard patch within 12 hours or as directed by MD 30 patch 1  . metFORMIN (GLUCOPHAGE) 500 MG tablet Take 1 tablet (500 mg total) by mouth 2 (two) times daily with a meal. 180 tablet 3  . Multiple Vitamin (MULTIVITAMIN WITH MINERALS) TABS tablet Take 1 tablet by mouth daily.    Marland Kitchen omeprazole (PRILOSEC) 20 MG capsule  Take 1 capsule (20 mg total) by mouth daily. 14 capsule 0  . ondansetron (ZOFRAN ODT) 4 MG disintegrating tablet Take 1 tablet (4 mg total) by mouth every 8 (eight) hours as needed for nausea or vomiting. 20 tablet 0  . ONE TOUCH ULTRA TEST test strip     . sucralfate (CARAFATE) 1 GM/10ML suspension Take 10 mLs (1 g total) by mouth 4 (four) times daily -  with meals and at bedtime. 420 mL 0  . traMADol (ULTRAM) 50 MG tablet Take 1 tablet (50 mg total) by mouth 4 (four) times daily. 120 tablet 1   Current Facility-Administered Medications on File Prior to Visit  Medication Dose Route Frequency Provider Last Rate Last Dose  . sodium chloride flush (NS) 0.9 % injection 10 mL  10 mL Intravenous PRN [place], Holli Humbles, NP   10 mL at 09/13/16 1146     Objective:  Objective  Physical Exam  Constitutional: She is oriented to person, place, and time. She appears well-developed and well-nourished.  HENT:  Head: Normocephalic and atraumatic.  Mouth/Throat: Abnormal dentition. Dental caries present.  Eyes: Conjunctivae and EOM are normal.  Neck: Normal range of motion. Neck supple. No JVD present. Carotid bruit is not present. No thyromegaly present.  Cardiovascular: Normal rate, regular rhythm and normal heart sounds.  No murmur heard. Pulmonary/Chest: Effort normal and breath sounds normal. No respiratory distress. She has no wheezes. She has no rales. She exhibits no tenderness.  Musculoskeletal: She exhibits no edema.  Neurological: She is alert and oriented to person, place, and time.  Psychiatric: She has a normal mood and affect.  Nursing note and vitals reviewed.  BP 122/78 (BP Location: Left Arm, Patient Position: Sitting, Cuff Size: Large)   Pulse (!) 108   Temp 98.6 F (37 C) (Oral)   Wt 208 lb 6 oz (94.5 kg)   SpO2 96%   BMI 33.63 kg/m  Wt Readings from Last 3 Encounters:  06/09/17 208 lb 6 oz (94.5 kg)  05/26/17 209 lb 3.2 oz (94.9 kg)  03/07/17 219 lb (99.3 kg)     Lab  Results  Component Value Date   WBC 8.3 12/30/2016   HGB 13.1 12/30/2016   HCT 38.3 12/30/2016   PLT 344 12/30/2016   GLUCOSE 201 (H) 12/30/2016   CHOL 149 12/27/2016   TRIG 93.0 12/27/2016   HDL 56.80 12/27/2016   LDLCALC 73 12/27/2016   ALT 21 12/30/2016   AST 23 12/30/2016   NA 142 12/30/2016   K 3.7 12/30/2016   CL 109 12/30/2016   CREATININE 1.31 (H) 12/30/2016   BUN 16 12/30/2016   CO2 22 12/30/2016   TSH 3.061 05/13/2016   INR 0.98 11/05/2010   HGBA1C 8.8 (H) 12/27/2016   MICROALBUR 0.7 06/21/2016    Dg Chest 2 View  Result Date: 05/30/2017 CLINICAL DATA:  Asthma, flu, cough EXAM: CHEST  2 VIEW COMPARISON:  12/17/2016 FINDINGS: Lungs are clear.  No pleural effusion or  pneumothorax. The heart is normal in size. Right chest power port terminates in the lower SVC. Degenerative changes of the visualized thoracolumbar spine. IMPRESSION: No evidence of acute cardiopulmonary disease. Electronically Signed   By: Julian Hy M.D.   On: 05/30/2017 19:22   Dg Shoulder Right  Result Date: 05/30/2017 CLINICAL DATA:  66 year old female with right shoulder pain after injury. Initial encounter. EXAM: RIGHT SHOULDER - 2+ VIEW COMPARISON:  None. FINDINGS: No fracture or dislocation. Acromioclavicular joint and glenohumeral joint degenerative changes. No abnormal soft tissue calcifications. Right central line in place.  Visualized lungs clear. IMPRESSION: No fracture or dislocation. Acromioclavicular joint and glenohumeral joint degenerative changes. Electronically Signed   By: Genia Del M.D.   On: 05/30/2017 19:09     Assessment & Plan:  Plan  I have discontinued [place] Scheidegger. [place]'s albuterol, oseltamivir, and predniSONE. I am also having her start on clindamycin. Additionally, I am having her maintain her multivitamin with minerals, aspirin, B-D ASSURE BPM/MANUAL ARM CUFF, OT ULTRA/FASTTK CNTRL SOLN, ONE TOUCH ULTRA 2, ONE TOUCH ULTRA TEST, gabapentin, DULoxetine, EMBEDA, sucralfate,  omeprazole, ondansetron, metFORMIN, cyclobenzaprine, lidocaine, glimepiride, ipratropium-albuterol, traMADol, FREESTYLE LIBRE READER, and [place].  Meds ordered this encounter  Medications  . clindamycin (CLEOCIN) 300 MG capsule    Sig: Take 1 capsule (300 mg total) 3 (three) times daily by mouth.    Dispense:  10 capsule    Refill:  0    Problem List Items Addressed This Visit      Unprioritized   Asthma    Stable Acute symptoms resolved       Influenza A    Symptoms resolved      Relevant Medications   clindamycin (CLEOCIN) 300 MG capsule    Other Visit Diagnoses    Dental abscess    -  Primary   Relevant Medications   clindamycin (CLEOCIN) 300 MG capsule    f/u dentist Follow-up: Return if symptoms worsen or fail to improve.  Ann Held, DO

## 2017-06-09 NOTE — Patient Instructions (Signed)
Dental Abscess A dental abscess is a collection of pus in or around a tooth. What are the causes? This condition is caused by a bacterial infection around the root of the tooth that involves the inner part of the tooth (pulp). It may result from:  Severe tooth decay.  Trauma to the tooth that allows bacteria to enter into the pulp, such as a broken or chipped tooth.  Severe gum disease around a tooth.  What are the signs or symptoms? Symptoms of this condition include:  Severe pain in and around the infected tooth.  Swelling and redness around the infected tooth, in the mouth, or in the face.  Tenderness.  Pus drainage.  Bad breath.  Bitter taste in the mouth.  Difficulty swallowing.  Difficulty opening the mouth.  Nausea.  Vomiting.  Chills.  Swollen neck glands.  Fever.  How is this diagnosed? This condition is diagnosed with examination of the infected tooth. During the exam, your dentist may tap on the infected tooth. Your dentist will also ask about your medical and dental history and may order X-rays. How is this treated? This condition is treated by eliminating the infection. This may be done with:  Antibiotic medicine.  A root canal. This may be performed to save the tooth.  Pulling (extracting) the tooth. This may also involve draining the abscess. This is done if the tooth cannot be saved.  Follow these instructions at home:  Take medicines only as directed by your dentist.  If you were prescribed antibiotic medicine, finish all of it even if you start to feel better.  Rinse your mouth (gargle) often with salt water to relieve pain or swelling.  Do not drive or operate heavy machinery while taking pain medicine.  Do not apply heat to the outside of your mouth.  Keep all follow-up visits as directed by your dentist. This is important. Contact a health care provider if:  Your pain is worse and is not helped by medicine. Get help right away  if:  You have a fever or chills.  Your symptoms suddenly get worse.  You have a very bad headache.  You have problems breathing or swallowing.  You have trouble opening your mouth.  You have swelling in your neck or around your eye. This information is not intended to replace advice given to you by your health care provider. Make sure you discuss any questions you have with your health care provider. Document Released: 07/11/2005 Document Revised: 11/19/2015 Document Reviewed: 07/08/2014 Elsevier Interactive Patient Education  2017 [employer].

## 2017-06-10 IMAGING — CT CT ABD-PELV W/ CM
2 of 5 series · 16 of 46 positions shown, 18 images · IV contrast (APPLIED)
Comparison: Most recent CT comparison 01/13/2016

CLINICAL DATA: Generalized diffuse abdominal pain. Nausea. Symptoms
for 3 days.

EXAM:
CT ABDOMEN AND PELVIS WITH CONTRAST
TECHNIQUE: Multidetector CT imaging of the abdomen and pelvis was performed
using the standard protocol following bolus administration of
intravenous contrast.
CONTRAST:  80mL BUYNF7-CNN IOPAMIDOL (BUYNF7-CNN) INJECTION 61%

[Series 2: axial st · axial · 0.97mm/px · z∈[-472,-36]mm · 13 of 97 slices shown, 15 images]
[im 5/97  soft-tissue]
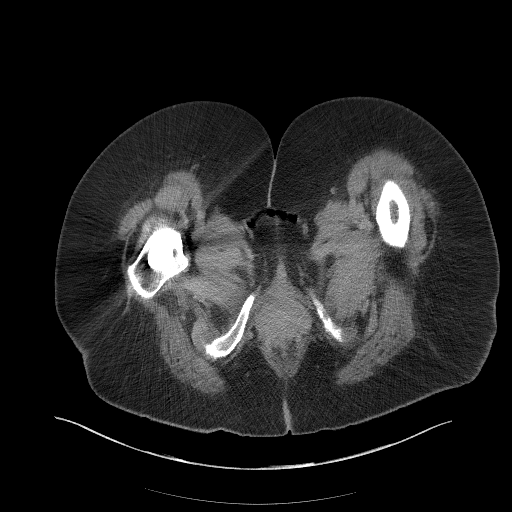
[im 5/97  bone]
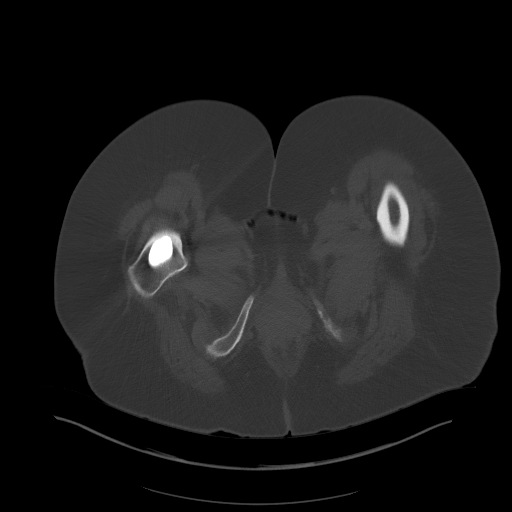
[im 14/97  soft-tissue]
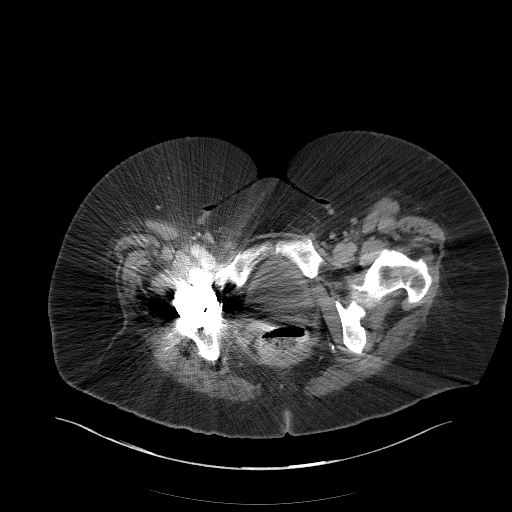
[im 22/97  soft-tissue]
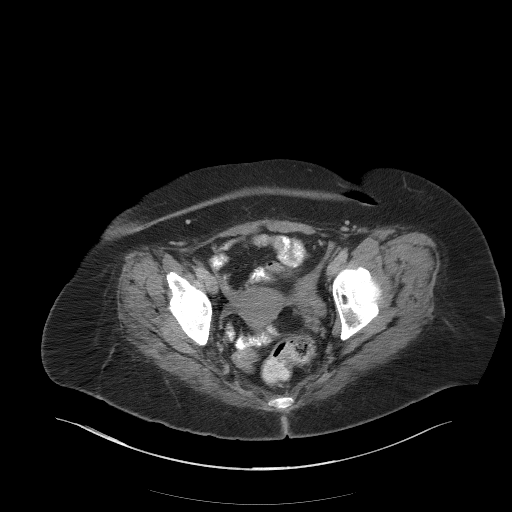
[im 27/97  soft-tissue]
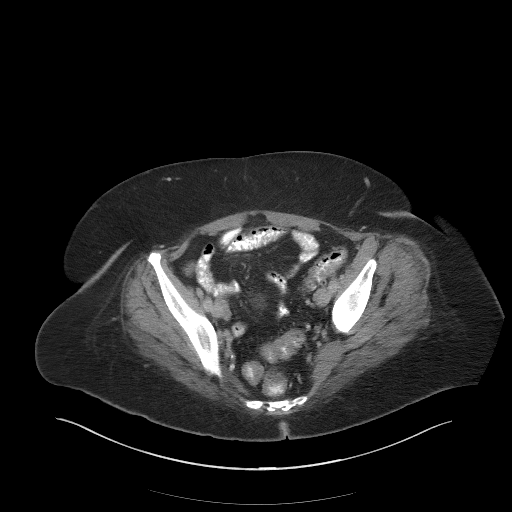
[im 35/97  soft-tissue]
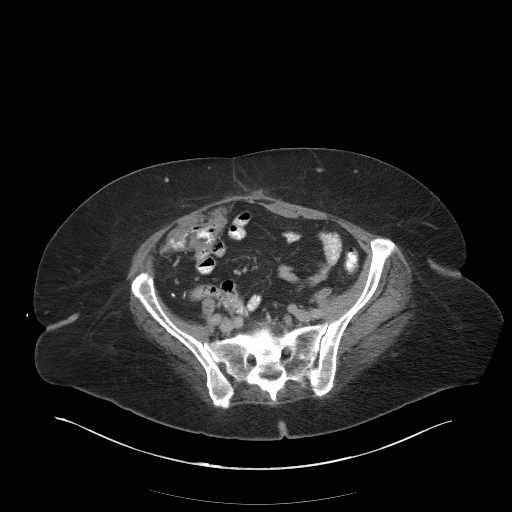
[im 40/97  soft-tissue]
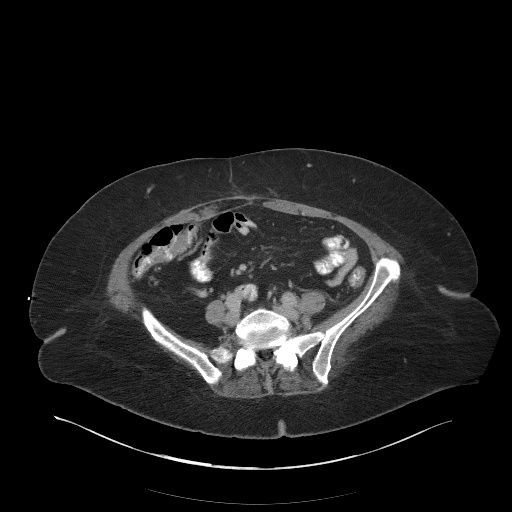
[im 49/97  soft-tissue]
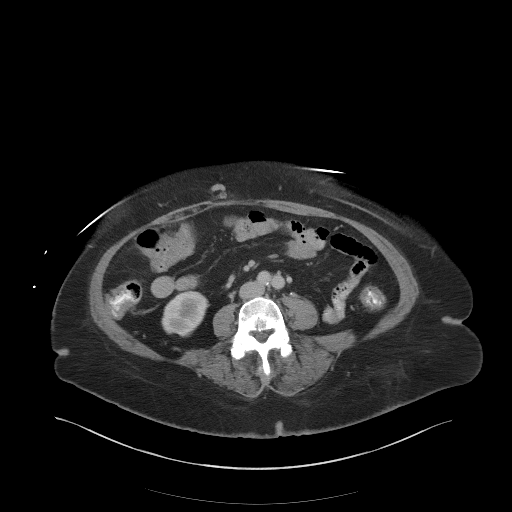
[im 57/97  soft-tissue]
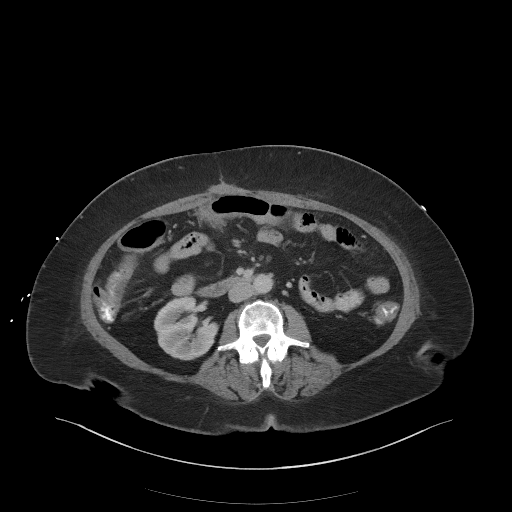
[im 62/97  soft-tissue]
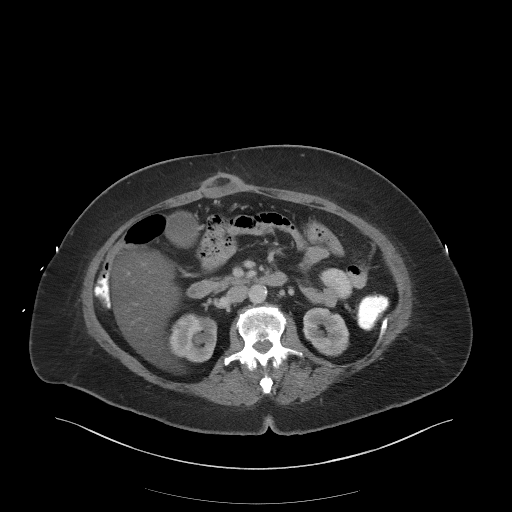
[im 62/97  bone]
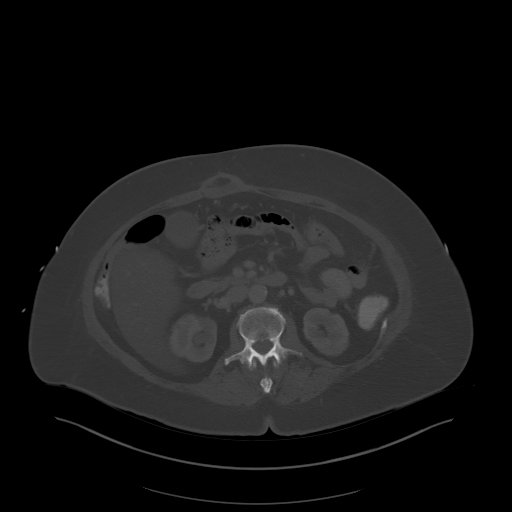
[im 70/97  soft-tissue]
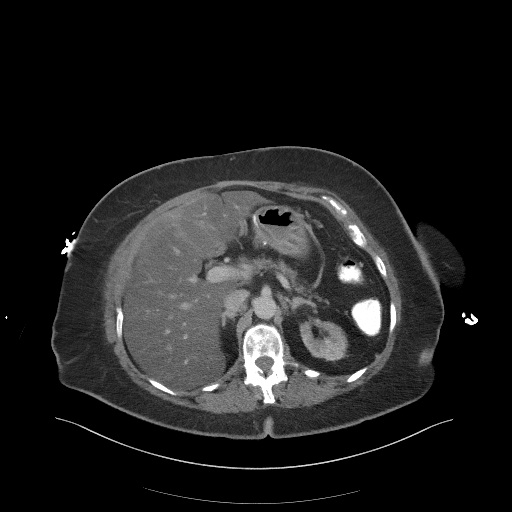
[im 75/97  soft-tissue]
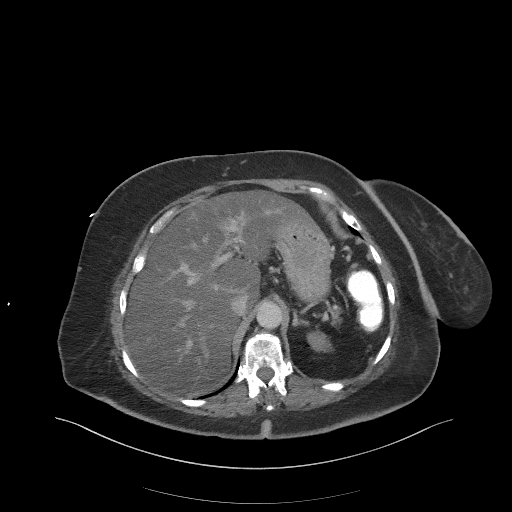
[im 83/97  soft-tissue]
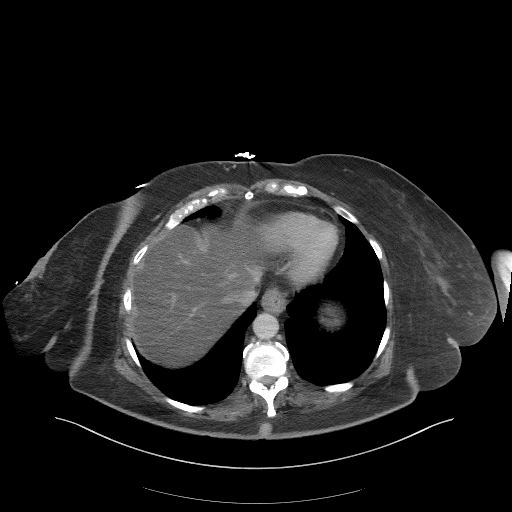
[im 92/97  soft-tissue]
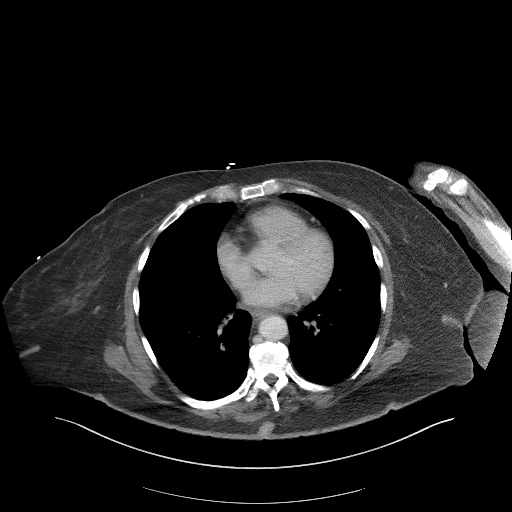

[Series 6: coronal st · coronal · 1.07mm/px · 3 of 92 slices shown]
[im 31/92  soft-tissue]
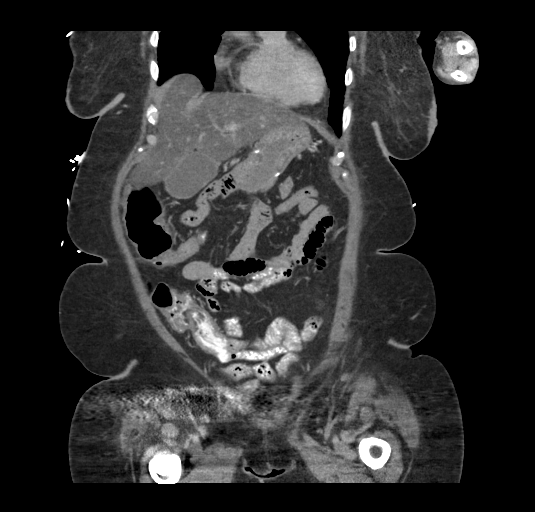
[im 41/92  soft-tissue]
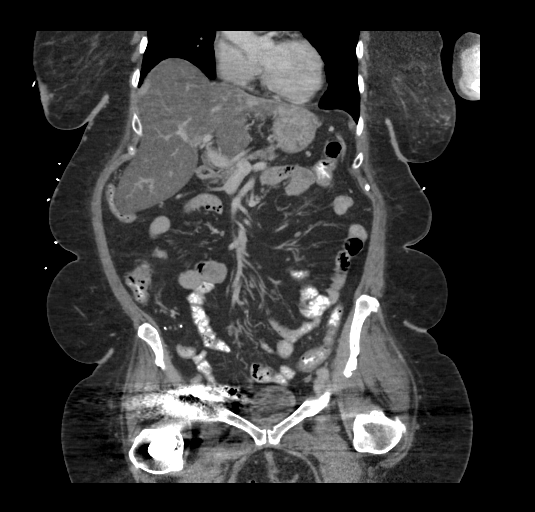
[im 51/92  soft-tissue]
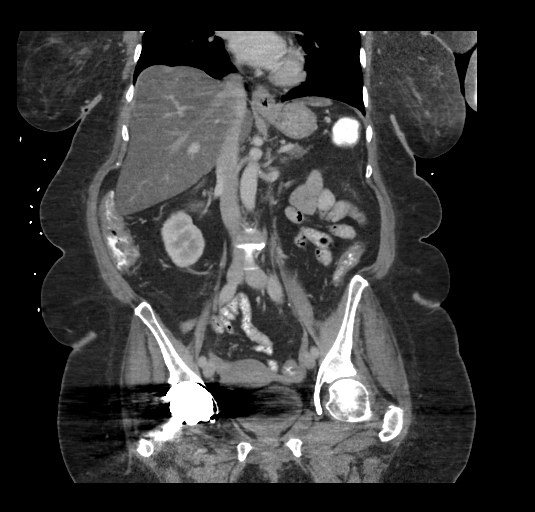

[16 of 46 positions shown; findings below may reference images not displayed]

FINDINGS: Lower chest: Chronic scarring medial left lower lobe. No pleural
fluid.

Liver: Severe steatosis.  No evidence of focal lesion.

Hepatobiliary: Gallbladder physiologically distended, no calcified
stone. No biliary dilatation.

Pancreas: Pancreatic atrophy.  No ductal dilatation or inflammation.

Spleen: Normal.

Adrenal glands: No nodule.

Kidneys: Left upper pole renal scarring with decreased profusion is
unchanged from prior. There is no hydronephrosis or perinephric
edema. Ureters are decompressed.

Stomach/Bowel: Small hiatal hernia. Stomach physiologically
distended. Postsurgical change post partial gastrectomy. No obvious
gastric mass by CT. Questioning gastrojejunostomy. There are no
dilated or thickened small bowel loops. No bowel obstruction,
enteric contrast reaches the descending colon. Short segment of
colonic wall thickening involving the mid transverse colon with
minimal adjacent edema. Diverticulosis of the descending colon
without acute diverticulitis. Post appendectomy.

Vascular/Lymphatic: No retroperitoneal adenopathy. Abdominal aorta
is normal in caliber. Atherosclerosis without aneurysm. Mesenteric
vessels are patent.

Reproductive: Uterus and adnexa are normal for age. Lower most
pelvis partially obscured by streak artifact from right hip
arthroplasty.

Bladder: Obscured by streak artifact from right hip arthroplasty.

Other: No free air, free fluid, or intra-abdominal fluid collection.
Postsurgical change of the ventral upper abdominal wall, stable in
appearance from prior.

Musculoskeletal: There are no acute or suspicious osseous
abnormalities. Stable degenerative change in the lumbar spine with
primarily facet arthropathy. Minimal anterolisthesis of L4 on L5 is
again seen. Right hip arthroplasty.
IMPRESSION: 1. Mild wall thickening adjacent edema of the mid transverse colon.
This may be infectious or inflammatory. Neoplastic etiology is not
excluded, and would be better assessed with direct visualization
with colonoscopy.
2. Otherwise no acute abnormality in the abdomen/pelvis. Stable
findings of severe hepatic steatosis, small hiatal hernia, distal
colonic diverticulosis, partial gastrectomy and left renal scarring.

## 2017-06-11 NOTE — Assessment & Plan Note (Signed)
Symptoms resolved

## 2017-06-11 NOTE — Assessment & Plan Note (Signed)
Stable Acute symptoms resolved

## 2017-06-12 DIAGNOSIS — G620 Drug-induced polyneuropathy: Secondary | ICD-10-CM | POA: Diagnosis not present

## 2017-06-12 DIAGNOSIS — G894 Chronic pain syndrome: Secondary | ICD-10-CM | POA: Diagnosis not present

## 2017-06-12 DIAGNOSIS — E1142 Type 2 diabetes mellitus with diabetic polyneuropathy: Secondary | ICD-10-CM | POA: Diagnosis not present

## 2017-06-12 DIAGNOSIS — R69 Illness, unspecified: Secondary | ICD-10-CM | POA: Diagnosis not present

## 2017-06-27 NOTE — Progress Notes (Deleted)
Subjective:   [name] is a 66 y.o. female who presents for Medicare Annual (Subsequent) preventive examination.  Review of Systems:  No ROS.  Medicare Wellness Visit. Additional risk factors are reflected in the social history.    Sleep patterns:  Home Safety/Smoke Alarms: Feels safe in home. Smoke alarms in place.   Female:   Pap-       Mammo- scheduled 07/01/17      Dexa scan-        CCS- last 06/01/16: recall 5 yrs     Objective:     Vitals: There were no vitals taken for this visit.  There is no height or weight on file to calculate BMI.  Advanced Directives 12/30/2016 12/17/2016 12/17/2016 11/04/2016 08/02/2016 06/27/2016 06/21/2016  Does Patient Have a Medical Advance Directive? _0  No No  Would patient like information on creating a medical advance directive? No - Patient declined No - Patient declined - - - No - Patient declined No - Patient declined    Tobacco Social History   Tobacco Use  Smoking Status Former Smoker  . Packs/day: 1.00  . Years: 15.00  . Pack years: 15.00  . Types: Cigarettes  . Start date: 09/27/1973  . Last attempt to quit: 10/15/1988  . Years since quitting: 28.7  Smokeless Tobacco Never Used  Tobacco Comment   quit smoking 14 years ago     Counseling given: Not Answered Comment: quit smoking 14 years ago   Clinical Intake:                                Past Medical History:  Diagnosis Date  . Asthma   . Degenerative joint disease   . Diabetes mellitus   . Dyspnea   . GERD (gastroesophageal reflux disease)   . Hyperlipidemia    no longer on medication for this  . Hypertension    patient denies  . Neuropathy    bilateral hands and feet  . Sickle cell trait ([place])   . Sleep apnea   . stomach ca dx'd 2010   chemo/xrt comp 05/2009  . TIA (transient ischemic attack) 07/2015   Past Surgical History:  Procedure Laterality Date  . APPENDECTOMY    . CESAREAN SECTION    . COLON SURGERY     colonscopy  . COLONOSCOPY WITH PROPOFOL N/A 06/01/2016   Procedure: COLONOSCOPY WITH PROPOFOL;  Surgeon: Wilford Corner, MD;  Location: [facility] ENDOSCOPY;  Service: Endoscopy;  Laterality: N/A;  . ESOPHAGOGASTRODUODENOSCOPY N/A 10/15/2012   Procedure: ESOPHAGOGASTRODUODENOSCOPY (EGD);  Surgeon: Lear Ng, MD;  Location: [facility] ENDOSCOPY;  Service: Endoscopy;  Laterality: N/A;  . ESOPHAGOGASTRODUODENOSCOPY N/A 01/15/2016   Procedure: ESOPHAGOGASTRODUODENOSCOPY (EGD);  Surgeon: Ronald Lobo, MD;  Location: [facility] ENDOSCOPY;  Service: Endoscopy;  Laterality: N/A;  . HERNIA REPAIR    . SHOULDER SURGERY     Left  . TOTAL HIP ARTHROPLASTY     Right  . TOTAL KNEE ARTHROPLASTY     Left   Family History  Problem Relation Age of Onset  . Diabetes Brother   . Alzheimer's disease Father 53  . Cancer Mother 39  . Diabetes Maternal Grandmother   . Cancer Maternal Grandfather        lung  . Suicidality Neg Hx   . Depression Neg Hx   . Dementia Neg Hx   . Anxiety disorder Neg Hx    Social History  Socioeconomic History  . Marital status: Divorced    Spouse name: Not on file  . Number of children: Not on file  . Years of education: Not on file  . Highest education level: Not on file  Social Needs  . Financial resource strain: Not on file  . Food insecurity - worry: Not on file  . Food insecurity - inability: Not on file  . Transportation needs - medical: Not on file  . Transportation needs - non-medical: Not on file  Occupational History  . Not on file  Tobacco Use  . Smoking status: Former Smoker    Packs/day: 1.00    Years: 15.00    Pack years: 15.00    Types: Cigarettes    Start date: 09/27/1973    Last attempt to quit: 10/15/1988    Years since quitting: 28.7  . Smokeless tobacco: Never Used  . Tobacco comment: quit smoking 14 years ago  Substance and Sexual Activity  . Alcohol use: No    Alcohol/week: 0.0 oz  . Drug use: No  . Sexual activity: No  Other Topics Concern  .  Not on file  Social History Narrative  . Not on file    Outpatient Encounter Medications as of 06/29/2017  Medication Sig  . aspirin 81 MG tablet Take 81 mg by mouth at bedtime.  . Blood Glucose Calibration (OT ULTRA/FASTTK CNTRL SOLN) SOLN   . Blood Glucose Monitoring Suppl (ONE TOUCH ULTRA 2) w/Device KIT   . Blood Pressure Monitoring (B-D ASSURE BPM/MANUAL ARM CUFF) MISC Check blood pressure daily.  . clindamycin (CLEOCIN) 300 MG capsule Take 1 capsule (300 mg total) 3 (three) times daily by mouth.  . Continuous Blood Gluc Receiver (FREESTYLE LIBRE READER) DEVI 1 Device by Does not apply route 3 (three) times daily.  . Continuous Blood Gluc Sensor (FREESTYLE LIBRE SENSOR SYSTEM) MISC 1 sensor every 10 days  . cyclobenzaprine (FLEXERIL) 5 MG tablet Take 1 tablet (5 mg total) by mouth 3 (three) times daily as needed for muscle spasms.  . DULoxetine (CYMBALTA) 60 MG capsule Take 1 capsule (60 mg total) by mouth 2 (two) times daily.  . EMBEDA 50-2 MG CPCR Take 1 capsule by mouth daily.  Marland Kitchen gabapentin (NEURONTIN) 400 MG capsule Take 2 capsules (800 mg total) by mouth 3 (three) times daily.  Marland Kitchen glimepiride (AMARYL) 4 MG tablet Take 1 tablet (4 mg total) by mouth 2 (two) times daily.  Marland Kitchen ipratropium-albuterol (DUONEB) 0.5-2.5 (3) MG/3ML SOLN Take 3 mLs by nebulization every 6 (six) hours as needed.  . lidocaine (LIDODERM) 5 % Place 1 patch onto the skin daily. Remove & Discard patch within 12 hours or as directed by MD  . metFORMIN (GLUCOPHAGE) 500 MG tablet Take 1 tablet (500 mg total) by mouth 2 (two) times daily with a meal.  . Multiple Vitamin (MULTIVITAMIN WITH MINERALS) TABS tablet Take 1 tablet by mouth daily.  Marland Kitchen omeprazole (PRILOSEC) 20 MG capsule Take 1 capsule (20 mg total) by mouth daily.  . ondansetron (ZOFRAN ODT) 4 MG disintegrating tablet Take 1 tablet (4 mg total) by mouth every 8 (eight) hours as needed for nausea or vomiting.  . ONE TOUCH ULTRA TEST test strip   . sucralfate  (CARAFATE) 1 GM/10ML suspension Take 10 mLs (1 g total) by mouth 4 (four) times daily -  with meals and at bedtime.  . traMADol (ULTRAM) 50 MG tablet Take 1 tablet (50 mg total) by mouth 4 (four) times daily.   Facility-Administered  Encounter Medications as of 06/29/2017  Medication  . sodium chloride flush (NS) 0.9 % injection 10 mL    Activities of Daily Living No flowsheet data found.    Patient Care Team: Carollee Herter, Alferd Apa, DO as PCP - General (Family Medicine) Philemon Kingdom, MD as Consulting Physician (Internal Medicine) Wilford Corner, MD as Consulting Physician (Gastroenterology) Marin Olp Rudell Cobb, MD as Consulting Physician (Oncology) Camelia Phenes, DPM as Consulting Physician (Podiatry) Servando Salina, MD as Consulting Physician (Obstetrics and Gynecology)    Assessment:    Physical assessment deferred to PCP.  Exercise Activities and Dietary recommendations   Diet (meal preparation, eat out, water intake, caffeinated beverages, dairy products, fruits and vegetables): {Desc; diets:16563} Breakfast: Lunch:  Dinner:      Goals    . Increase physical activity    . Reduce fat intake       Fall Risk Fall Risk  09/13/2016 06/21/2016 06/21/2016 05/13/2016 01/25/2016  Falls in the past year? _0   Number falls in past yr: 2 or more 2 or more 2 or more 1 1  Comment - - pt report falling due to low b/p - -  Injury with Fall? _1   Comment - - - - shoulder pain  Risk Factor Category  High Fall Risk High Fall Risk - - -  Risk for fall due to : - History of fall(s);Impaired balance/gait - - -  Risk for fall due to: Comment - - - - -  Follow up - - - - -   Is the patient's home free of loose throw rugs in walkways, pet beds, electrical cords, etc?   {Blank single:19197::"yes","no"}      Grab bars in the bathroom? {Blank single:19197::"yes","no"}      Handrails on the stairs?   {Blank single:19197::"yes","no"}      Adequate  lighting?   {Blank single:19197::"yes","no"}  Depression Screen PHQ 2/9 Scores 06/21/2016 06/21/2016 01/25/2016 03/17/2015  PHQ - 2 Score 0 0 0 0     Cognitive Function MMSE - Mini Mental State Exam 03/17/2015  Orientation to time 5  Orientation to Place 5  Registration 3  Attention/ Calculation 5  Recall 1  Language- name 2 objects 2  Language- repeat 1  Language- follow 3 step command 3  Language- read & follow direction 1  Write a sentence 1  Copy design 1  Total score 28        Immunization History  Administered Date(s) Administered  . Influenza,inj,Quad PF,6+ Mos 05/27/2014, 04/06/2015, 06/21/2016  . Pneumococcal Polysaccharide-23 07/07/2015  . Tdap 02/17/2015   Screening Tests Health Maintenance  Topic Date Due  . HIV Screening  03/23/1967  . OPHTHALMOLOGY EXAM  05/28/2015  . INFLUENZA VACCINE  02/22/2017  . DEXA SCAN  03/22/2017  . PNA vac Low Risk Adult (1 of 2 - PCV13) 03/22/2017  . PAP SMEAR  05/27/2017  . URINE MICROALBUMIN  06/21/2017  . HEMOGLOBIN A1C  06/28/2017  . FOOT EXAM  12/27/2017  . MAMMOGRAM  06/30/2018  . TETANUS/TDAP  02/16/2025  . COLONOSCOPY  06/01/2026  . Hepatitis C Screening  Completed   Cancer Screenings: Lung: *** Low Dose CT Chest recommended if Age 89-80 years, 30 pack-year currently smoking OR have quit w/in 15years. Patient {DOES NOT does:27190::"does not"} qualify. Breast: *** Up to date on Mammogram? {Yes/No:30480221}  Up to date of Bone Density/Dexa? {Yes/No:30480221} Colorectal: ***  Additional Screenings: *** Hepatitis B/HIV/Syphillis: Hepatitis C Screening:  Plan:   ***   I have personally reviewed and noted the following in the patient's chart:   . Medical and social history . Use of alcohol, tobacco or illicit drugs  . Current medications and supplements . Functional ability and status . Nutritional status . Physical activity . Advanced directives . List of other physicians . Hospitalizations, surgeries,  and ER visits in previous 12 months . Vitals . Screenings to include cognitive, depression, and falls . Referrals and appointments  In addition, I have reviewed and discussed with patient certain preventive protocols, quality metrics, and best practice recommendations. A written personalized care plan for preventive services as well as general preventive health recommendations were provided to patient.     Shela Nevin, [place]  06/27/2017

## 2017-06-29 ENCOUNTER — Encounter: Payer: Self-pay | Admitting: Family Medicine

## 2017-07-01 ENCOUNTER — Ambulatory Visit (HOSPITAL_BASED_OUTPATIENT_CLINIC_OR_DEPARTMENT_OTHER): Payer: Self-pay

## 2017-07-04 IMAGING — CT CT ABD-PELV W/O CM
2 of 4 series · 16 of 46 positions shown, 18 images · non-contrast
Comparison: CT abdomen is dated 02/27/2016, 01/13/2016 and
01/04/2016.

CLINICAL DATA: Left-sided abdominal pain.

EXAM:
CT ABDOMEN AND PELVIS WITHOUT CONTRAST
TECHNIQUE: Multidetector CT imaging of the abdomen and pelvis was performed
following the standard protocol without IV contrast.

[Series 2: abd/ pelvis 5.0 i30f 1 · axial · 0.84mm/px · z∈[-428,+12]mm · 13 of 96 slices shown, 15 images]
[im 4/96  soft-tissue]
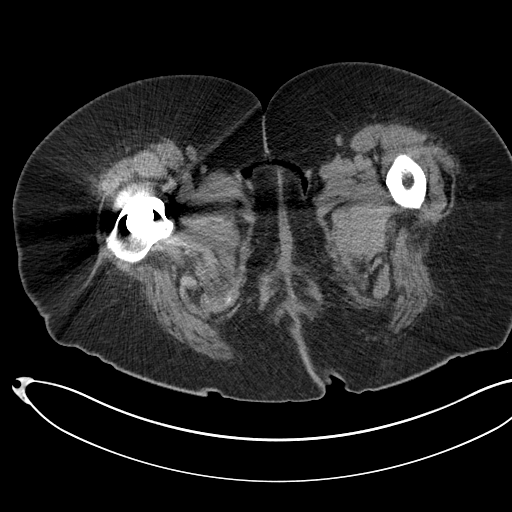
[im 4/96  bone]
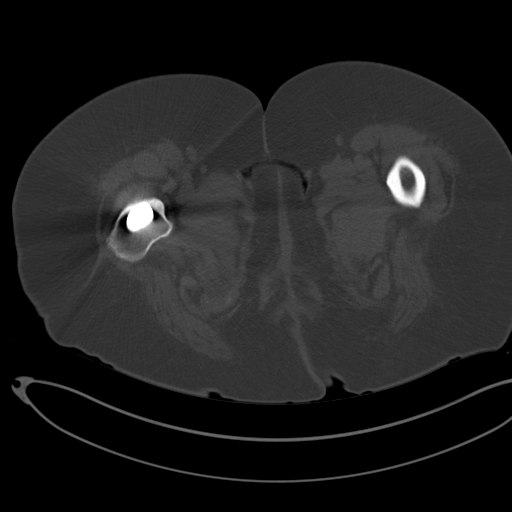
[im 12/96  soft-tissue]
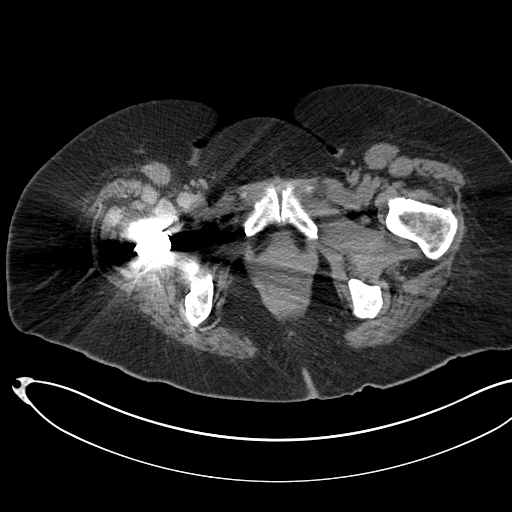
[im 24/96  soft-tissue]
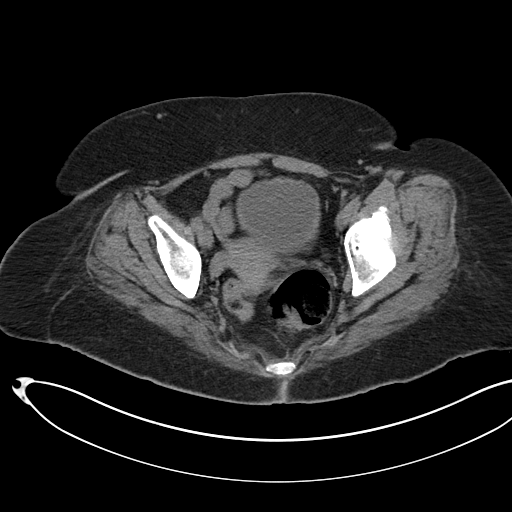
[im 28/96  soft-tissue]
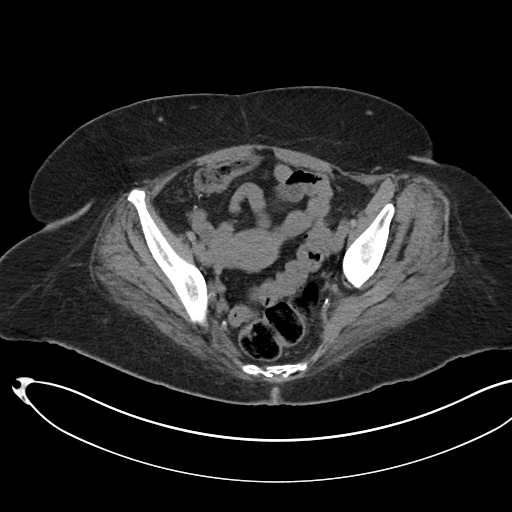
[im 36/96  soft-tissue]
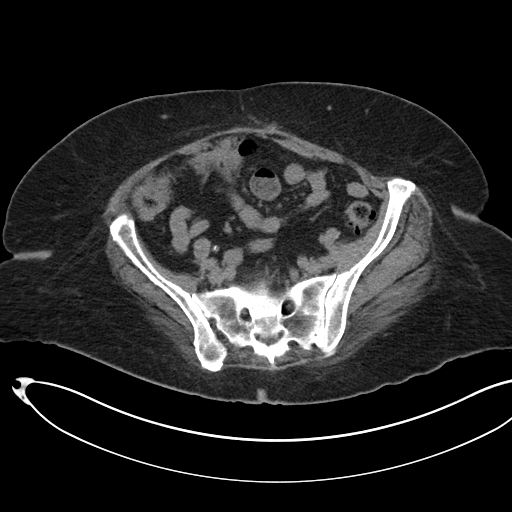
[im 44/96  soft-tissue]
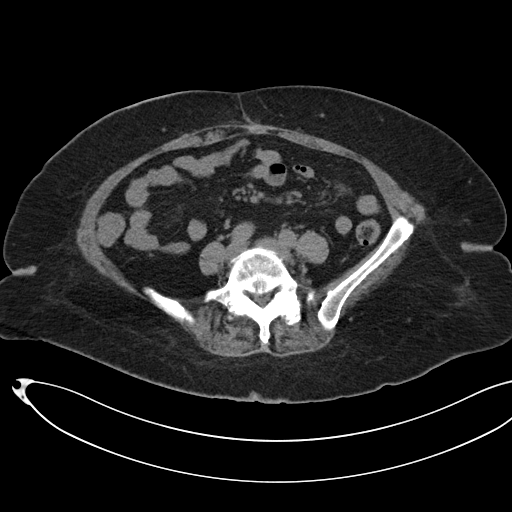
[im 52/96  soft-tissue]
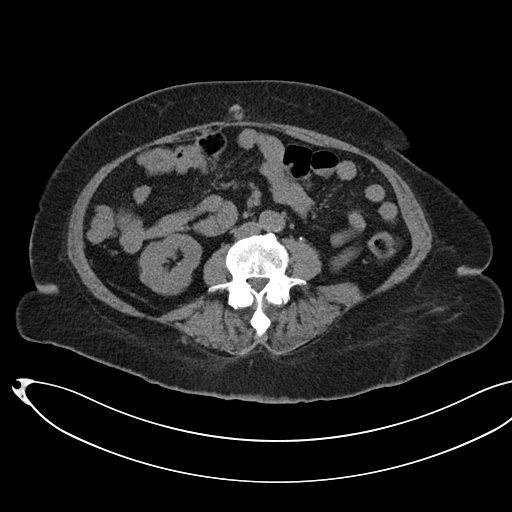
[im 56/96  soft-tissue]
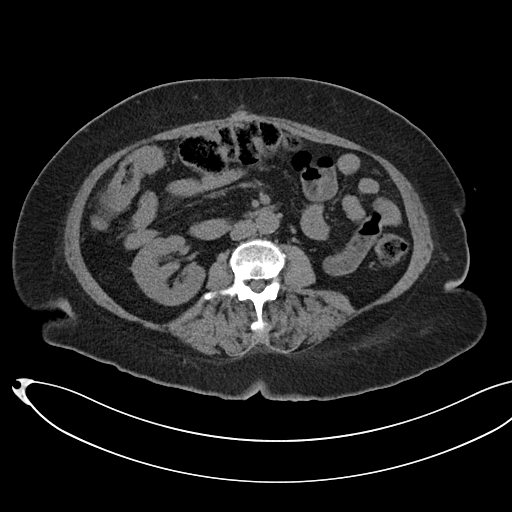
[im 64/96  soft-tissue]
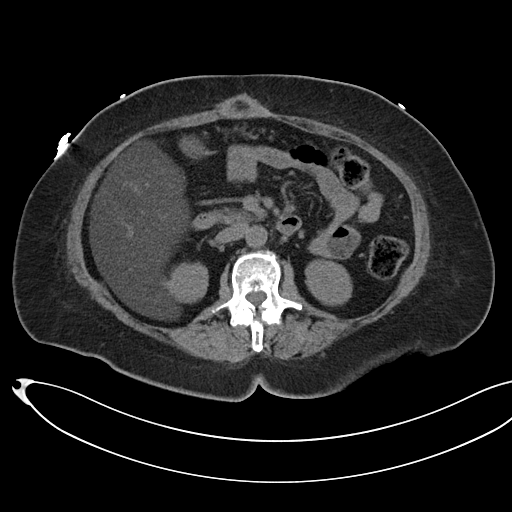
[im 64/96  bone]
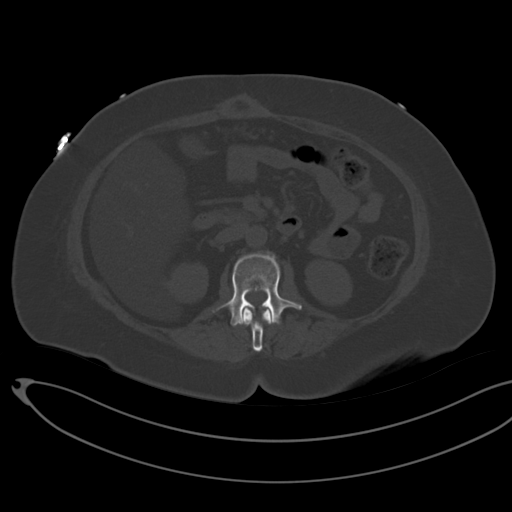
[im 72/96  soft-tissue]
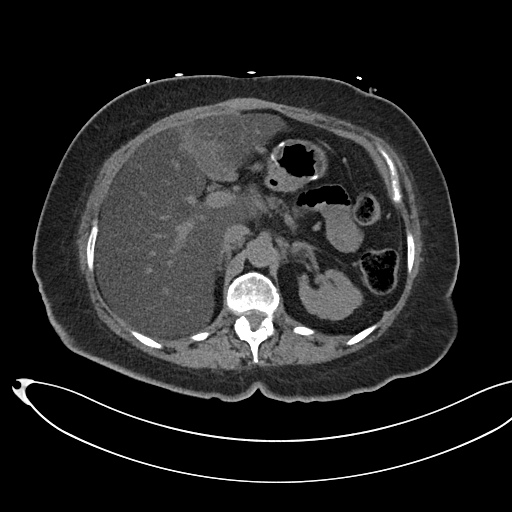
[im 76/96  soft-tissue]
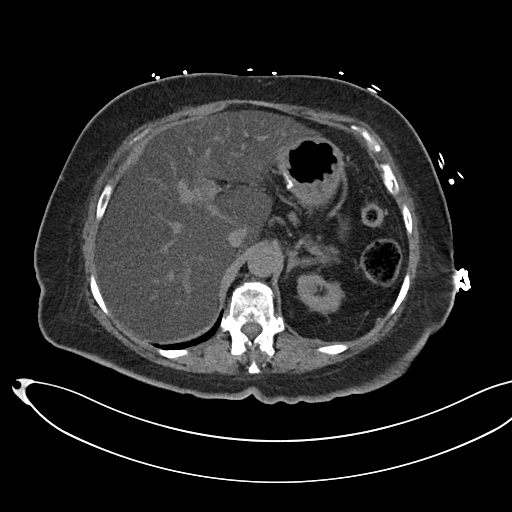
[im 84/96  soft-tissue]
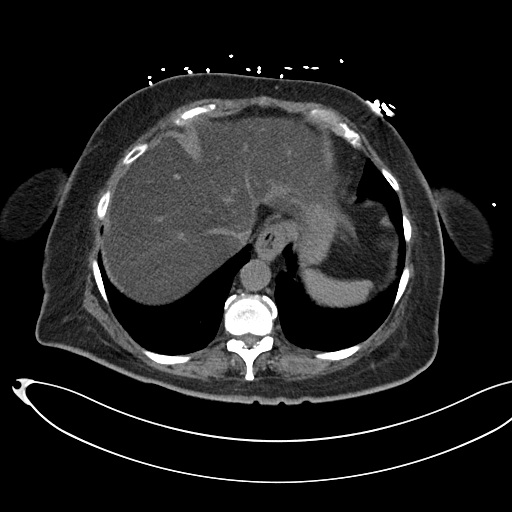
[im 92/96  soft-tissue]
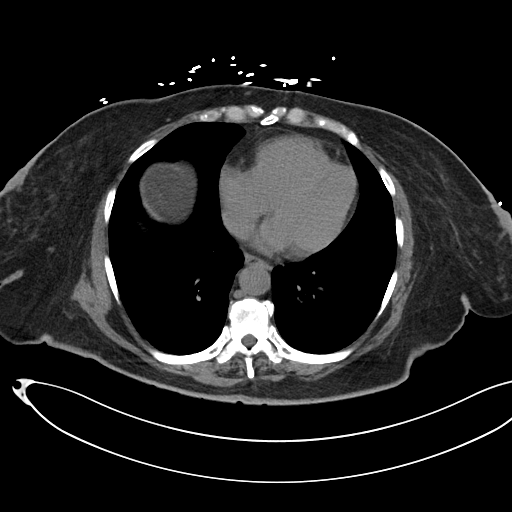

[Series 5: cor st · coronal · 0.96mm/px · 3 of 91 slices shown]
[im 31/91  soft-tissue]
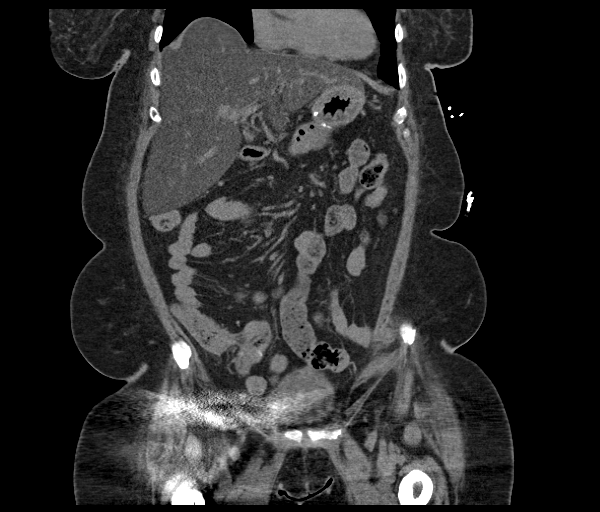
[im 41/91  soft-tissue]
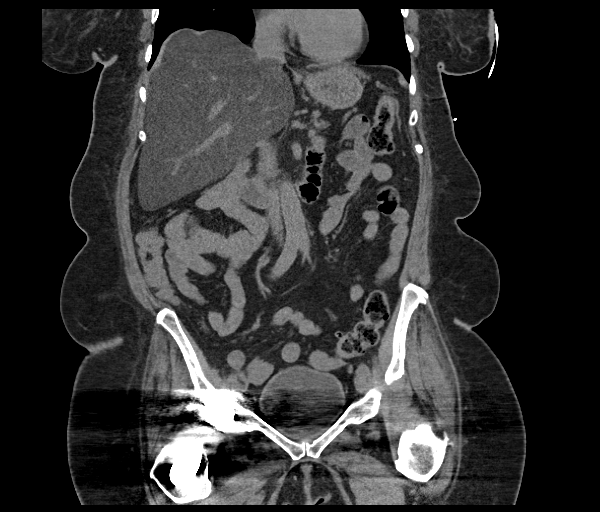
[im 51/91  soft-tissue]
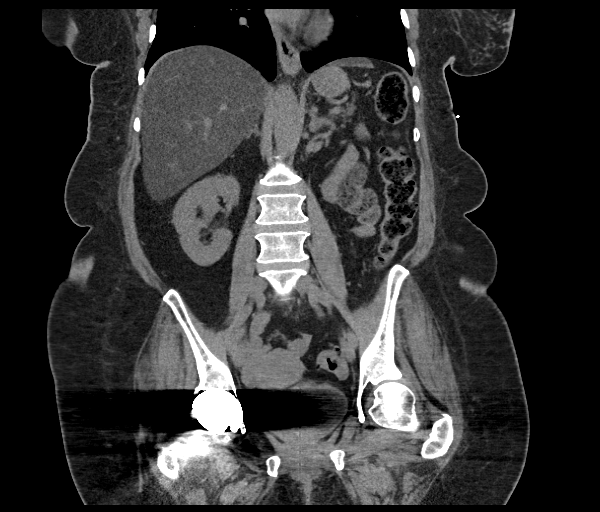

[16 of 46 positions shown; findings below may reference images not displayed]

FINDINGS: Lower chest:  No acute findings.

Hepatobiliary: Liver is diffusely low in density consistent with
fatty infiltration. Gallbladder is unremarkable.

Pancreas: Atrophic.

Spleen: Within normal limits in size.

Adrenals/Urinary Tract: Adrenal glands appear normal. Kidneys are
unremarkable without mass, stone or hydronephrosis. No perinephric
inflammation. No ureteral or bladder calculi identified. Bladder
appears normal, although partially obscured by metallic artifact
emanating from patient's right hip prosthesis.

Stomach/Bowel: Surgical changes of partial gastrectomy and
gastrojejunostomy. No obstruction, fluid collection or other
surgical complicating feature in this area. No small or large bowel
dilatation. No bowel wall thickening or evidence of bowel wall
inflammation seen. Appendix is not seen, presumed appendectomy. Mild
diverticulosis of the sigmoid colon without evidence of acute
diverticulitis.

Vascular/Lymphatic: Scattered atherosclerotic changes of the normal
caliber abdominal aorta. No pathologic appearing lymph nodes.

Reproductive: No mass or other significant abnormality.

Other: No free fluid or abscess collection identified. No free
intraperitoneal air.

Musculoskeletal: Right hip arthroplasty hardware appears intact and
appropriately positioned. Degenerative changes seen throughout the
thoracolumbar spine, mild to moderate in degree. No acute or
suspicious osseous finding. Superficial soft tissues are
unremarkable.
IMPRESSION: 1. No acute findings in the abdomen or pelvis.
2. Postsurgical changes of partial gastrectomy and
gastrojejunostomy. No evidence of surgical complicating feature. No
bowel obstruction or evidence of bowel wall inflammation.
3. Severe hepatic steatosis, stable.
4. Mild colonic diverticulosis without evidence of acute
diverticulitis.
5. Aortic atherosclerosis.
6. Additional chronic/incidental findings detailed above.

## 2017-07-06 ENCOUNTER — Ambulatory Visit (HOSPITAL_BASED_OUTPATIENT_CLINIC_OR_DEPARTMENT_OTHER): Payer: Medicare HMO

## 2017-07-06 ENCOUNTER — Other Ambulatory Visit (HOSPITAL_BASED_OUTPATIENT_CLINIC_OR_DEPARTMENT_OTHER): Payer: Medicare HMO

## 2017-07-06 ENCOUNTER — Ambulatory Visit (HOSPITAL_BASED_OUTPATIENT_CLINIC_OR_DEPARTMENT_OTHER): Payer: Medicare HMO | Admitting: Family

## 2017-07-06 ENCOUNTER — Encounter: Payer: Self-pay | Admitting: Family

## 2017-07-06 ENCOUNTER — Other Ambulatory Visit: Payer: Self-pay

## 2017-07-06 VITALS — BP 113/70 | HR 94 | Temp 99.4°F | Resp 18 | Wt 208.2 lb

## 2017-07-06 DIAGNOSIS — Z85028 Personal history of other malignant neoplasm of stomach: Secondary | ICD-10-CM

## 2017-07-06 DIAGNOSIS — Z923 Personal history of irradiation: Secondary | ICD-10-CM | POA: Diagnosis not present

## 2017-07-06 DIAGNOSIS — Z862 Personal history of diseases of the blood and blood-forming organs and certain disorders involving the immune mechanism: Secondary | ICD-10-CM | POA: Diagnosis not present

## 2017-07-06 DIAGNOSIS — C162 Malignant neoplasm of body of stomach: Secondary | ICD-10-CM

## 2017-07-06 DIAGNOSIS — G620 Drug-induced polyneuropathy: Secondary | ICD-10-CM | POA: Diagnosis not present

## 2017-07-06 DIAGNOSIS — R5383 Other fatigue: Secondary | ICD-10-CM | POA: Diagnosis not present

## 2017-07-06 DIAGNOSIS — R1012 Left upper quadrant pain: Secondary | ICD-10-CM | POA: Diagnosis not present

## 2017-07-06 DIAGNOSIS — D508 Other iron deficiency anemias: Secondary | ICD-10-CM

## 2017-07-06 DIAGNOSIS — C169 Malignant neoplasm of stomach, unspecified: Secondary | ICD-10-CM

## 2017-07-06 LAB — CBC WITH DIFFERENTIAL (CANCER CENTER ONLY)
BASO#: 0 10*3/uL (ref 0.0–0.2)
BASO%: 0.2 % (ref 0.0–2.0)
EOS ABS: 0.6 10*3/uL — AB (ref 0.0–0.5)
EOS%: 6.2 % (ref 0.0–7.0)
HEMATOCRIT: 37.2 % (ref 34.8–46.6)
HEMOGLOBIN: 12.5 g/dL (ref 11.6–15.9)
LYMPH#: 3 10*3/uL (ref 0.9–3.3)
LYMPH%: 32.1 % (ref 14.0–48.0)
MCH: 30 pg (ref 26.0–34.0)
MCHC: 33.6 g/dL (ref 32.0–36.0)
MCV: 89 fL (ref 81–101)
MONO#: 0.8 10*3/uL (ref 0.1–0.9)
MONO%: 8.3 % (ref 0.0–13.0)
NEUT%: 53.2 % (ref 39.6–80.0)
NEUTROS ABS: 5 10*3/uL (ref 1.5–6.5)
Platelets: 312 10*3/uL (ref 145–400)
RBC: 4.16 10*6/uL (ref 3.70–5.32)
RDW: 14.7 % (ref 11.1–15.7)
WBC: 9.4 10*3/uL (ref 3.9–10.0)

## 2017-07-06 LAB — COMPREHENSIVE METABOLIC PANEL
ALBUMIN: 3.1 g/dL — AB (ref 3.5–5.0)
ALT: 14 U/L (ref 0–55)
ANION GAP: 12 meq/L — AB (ref 3–11)
AST: 18 U/L (ref 5–34)
Alkaline Phosphatase: 115 U/L (ref 40–150)
BUN: 19.7 mg/dL (ref 7.0–26.0)
CALCIUM: 9.3 mg/dL (ref 8.4–10.4)
CO2: 22 meq/L (ref 22–29)
Chloride: 104 mEq/L (ref 98–109)
Creatinine: 1.3 mg/dL — ABNORMAL HIGH (ref 0.6–1.1)
EGFR: 51 mL/min/{1.73_m2} — AB (ref >60)
GLUCOSE: 149 mg/dL — AB (ref 70–140)
Potassium: 4.4 mEq/L (ref 3.5–5.1)
Sodium: 138 mEq/L (ref 136–145)
TOTAL PROTEIN: 7.4 g/dL (ref 6.4–8.3)
Total Bilirubin: 0.3 mg/dL (ref 0.20–1.20)

## 2017-07-06 LAB — IRON AND TIBC
%SAT: 19 % — ABNORMAL LOW (ref 21–57)
Iron: 44 ug/dL (ref 41–142)
TIBC: 234 ug/dL — AB (ref 236–444)
UIBC: 190 ug/dL (ref 120–384)

## 2017-07-06 LAB — SEDIMENTATION RATE: Sedimentation Rate-Westergren: 57 mm/hr — ABNORMAL HIGH (ref 0–40)

## 2017-07-06 LAB — FERRITIN: Ferritin: 253 ng/ml (ref 9–269)

## 2017-07-06 MED ORDER — HEPARIN SOD (PORK) LOCK FLUSH 100 UNIT/ML IV SOLN
500.0000 [IU] | Freq: Once | INTRAVENOUS | Status: AC | PRN
  Administered 2017-07-06: 500 [IU] via INTRAVENOUS
  Filled 2017-07-06: qty 5

## 2017-07-06 MED ORDER — SODIUM CHLORIDE 0.9 % IJ SOLN
10.0000 mL | INTRAMUSCULAR | Status: DC | PRN
  Administered 2017-07-06: 10 mL via INTRAVENOUS
  Filled 2017-07-06: qty 10

## 2017-07-06 NOTE — Progress Notes (Signed)
Hematology and Oncology Follow Up Visit  [name] [id number] 1950/10/01 66 y.o. 07/06/2017   Principle Diagnosis:  Stage IB (T1, N1, M0) adenocarcinoma of the stomach Iron deficiency anemia  Current Therapy:   Observation IV iron as indicated - last received in December 2016    Interim History:  [name] is here today for follow-up. She is feeling fatigued and shaky. She states that she has not been checking her blood sugars.  No falls or syncopal episodes.  No fever, chills, n/v, cough, rash, dizziness, SOB, chest pain, palpitations, abdominal pain or changes in bowel or bladder habits. She is still having urinary urgency at times.  Her neuropathy in the hands and feet is unchanged. No swelling in her extremities.  She has a good appetite and is staying hydrated.  She continues to walk for exercise and is down another 8 lbs since we saw her in April.    ECOG Performance Status: 1 - Symptomatic but completely ambulatory  Medications:  Allergies as of 07/06/2017      Reactions   Adhesive [tape] Other (See Comments)   Burn Skin   Codeine Other (See Comments)   paranoid   Zocor [simvastatin - High Dose] Other (See Comments)   Muscle spasms   Penicillins Rash   Has patient had a PCN reaction causing immediate rash, facial/tongue/throat swelling, SOB or lightheadedness with hypotension: Yes Has patient had a PCN reaction causing severe rash involving mucus membranes or skin necrosis: No Has patient had a PCN reaction that required hospitalization does not remember.  Has patient had a PCN reaction occurring within the last 10 years: No If all of the above answers are "NO", then may proceed with Cephalosporin use.      Medication List        Accurate as of 07/06/17  8:52 AM. Always use your most recent med list.          aspirin 81 MG tablet Take 81 mg by mouth at bedtime.   B-D ASSURE BPM/MANUAL ARM CUFF Misc Check blood pressure daily.   clindamycin 300 MG  capsule Commonly known as:  CLEOCIN Take 1 capsule (300 mg total) 3 (three) times daily by mouth.   cyclobenzaprine 5 MG tablet Commonly known as:  FLEXERIL Take 1 tablet (5 mg total) by mouth 3 (three) times daily as needed for muscle spasms.   DULoxetine 60 MG capsule Commonly known as:  CYMBALTA Take 1 capsule (60 mg total) by mouth 2 (two) times daily.   EMBEDA 50-2 MG Cpcr Generic drug:  Morphine-Naltrexone Take 1 capsule by mouth daily.   FREESTYLE LIBRE READER Devi 1 Device by Does not apply route 3 (three) times daily.   FREESTYLE LIBRE SENSOR SYSTEM Misc 1 sensor every 10 days   gabapentin 400 MG capsule Commonly known as:  NEURONTIN Take 2 capsules (800 mg total) by mouth 3 (three) times daily.   glimepiride 4 MG tablet Commonly known as:  AMARYL Take 1 tablet (4 mg total) by mouth 2 (two) times daily.   ipratropium-albuterol 0.5-2.5 (3) MG/3ML Soln Commonly known as:  DUONEB Take 3 mLs by nebulization every 6 (six) hours as needed.   lidocaine 5 % Commonly known as:  LIDODERM Place 1 patch onto the skin daily. Remove & Discard patch within 12 hours or as directed by MD   metFORMIN 500 MG tablet Commonly known as:  GLUCOPHAGE Take 1 tablet (500 mg total) by mouth 2 (two) times daily with a meal.  multivitamin with minerals Tabs tablet Take 1 tablet by mouth daily.   omeprazole 20 MG capsule Commonly known as:  PRILOSEC Take 1 capsule (20 mg total) by mouth daily.   ondansetron 4 MG disintegrating tablet Commonly known as:  ZOFRAN ODT Take 1 tablet (4 mg total) by mouth every 8 (eight) hours as needed for nausea or vomiting.   ONE TOUCH ULTRA 2 w/Device Kit   ONE TOUCH ULTRA TEST test strip Generic drug:  glucose blood   OT ULTRA/FASTTK CNTRL SOLN Soln   sucralfate 1 GM/10ML suspension Commonly known as:  CARAFATE Take 10 mLs (1 g total) by mouth 4 (four) times daily -  with meals and at bedtime.   traMADol 50 MG tablet Commonly known as:   ULTRAM Take 1 tablet (50 mg total) by mouth 4 (four) times daily.       Allergies:  Allergies  Allergen Reactions  . Adhesive [Tape] Other (See Comments)    Burn Skin  . Codeine Other (See Comments)    paranoid  . Zocor [Simvastatin - High Dose] Other (See Comments)    Muscle spasms  . Penicillins Rash    Has patient had a PCN reaction causing immediate rash, facial/tongue/throat swelling, SOB or lightheadedness with hypotension: Yes Has patient had a PCN reaction causing severe rash involving mucus membranes or skin necrosis: No Has patient had a PCN reaction that required hospitalization does not remember.  Has patient had a PCN reaction occurring within the last 10 years: No If all of the above answers are "NO", then may proceed with Cephalosporin use.     Past Medical History, Surgical history, Social history, and Family History were reviewed and updated.  Review of Systems: All other 10 point review of systems is negative.   Physical Exam:  weight is 208 lb 4 oz (94.5 kg). Her oral temperature is 99.4 F (37.4 C). Her blood pressure is 113/70 and her pulse is 94. Her respiration is 18 and oxygen saturation is 99%.   Wt Readings from Last 3 Encounters:  07/06/17 208 lb 4 oz (94.5 kg)  06/09/17 208 lb 6 oz (94.5 kg)  05/26/17 209 lb 3.2 oz (94.9 kg)    Ocular: Sclerae unicteric, pupils equal, round and reactive to light Ear-nose-throat: Oropharynx clear, dentition fair Lymphatic: No cervical, supraclavicular or axillary adenopathy Lungs no rales or rhonchi, good excursion bilaterally Heart regular rate and rhythm, no murmur appreciated Abd soft, nontender, positive bowel sounds, no liver or spleen tip palpated on exam, no fluid wave  MSK no focal spinal tenderness, no joint edema Neuro: non-focal, well-oriented, appropriate affect Breasts: Deferred   Lab Results  Component Value Date   WBC 9.4 07/06/2017   HGB 12.5 07/06/2017   HCT 37.2 07/06/2017   MCV 89  07/06/2017   PLT 312 07/06/2017   Lab Results  Component Value Date   FERRITIN 422 (H) 08/02/2016   IRON 64 08/02/2016   TIBC 260 08/02/2016   UIBC 196 08/02/2016   IRONPCTSAT 25 08/02/2016   Lab Results  Component Value Date   RETICCTPCT 1.5 04/28/2014   RBC 4.16 07/06/2017   RETICCTABS 59.9 04/28/2014   No results found for: KPAFRELGTCHN, LAMBDASER, KAPLAMBRATIO No results found for: IGGSERUM, IGA, IGMSERUM No results found for: Kathrynn Ducking, MSPIKE, SPEI   Chemistry      Component Value Date/Time   NA 142 12/30/2016 1526   NA 139 11/04/2016 0756   K 3.7 12/30/2016 1526  K 4.8 11/04/2016 0756   CL 109 12/30/2016 1526   CL 101 08/02/2016 1351   CO2 22 12/30/2016 1526   CO2 25 11/04/2016 0756   BUN 16 12/30/2016 1526   BUN 18.7 11/04/2016 0756   CREATININE 1.31 (H) 12/30/2016 1526   CREATININE 1.2 (H) 11/04/2016 0756      Component Value Date/Time   CALCIUM 9.2 12/30/2016 1526   CALCIUM 9.3 11/04/2016 0756   ALKPHOS 84 12/30/2016 1526   ALKPHOS 114 11/04/2016 0756   AST 23 12/30/2016 1526   AST 16 11/04/2016 0756   ALT 21 12/30/2016 1526   ALT 15 11/04/2016 0756   BILITOT 0.9 12/30/2016 1526   BILITOT 0.38 11/04/2016 0756      Impression and Plan: Ms. Westergaard is a pleasant 66 yo African American female with history of stage IB stomach cancer. She had the tumor resected and completed adjuvant radiation and chemotherapy in 2010.  She has had iron deficiency in the past. Her last dose of IV iron was in 2016.  She is symptomatic with fatigued and feeling shakey at times. She admits that she has not been check her blood sugars recently and is unaware if this might be the underlying issue. She plans to call [facility] today to check on supplies and a new machine.  We will see what her iron studies show and bring her back in next week for an infusion if needed.  We will see her back in another 4 months for follow-up.  She  promises to contact our office with any questions or concerns. We can certainly see her sooner if need be.   Laverna Peace, NP 12/13/20188:52 AM

## 2017-07-06 NOTE — Patient Instructions (Signed)
Implanted Port Home Guide An implanted port is a type of central line that is placed under the skin. Central lines are used to provide IV access when treatment or nutrition needs to be given through a person's veins. Implanted ports are used for long-term IV access. An implanted port may be placed because:  You need IV medicine that would be irritating to the small veins in your hands or arms.  You need long-term IV medicines, such as antibiotics.  You need IV nutrition for a long period.  You need frequent blood draws for lab tests.  You need dialysis.  Implanted ports are usually placed in the chest area, but they can also be placed in the upper arm, the abdomen, or the leg. An implanted port has two main parts:  Reservoir. The reservoir is round and will appear as a small, raised area under your skin. The reservoir is the part where a needle is inserted to give medicines or draw blood.  Catheter. The catheter is a thin, flexible tube that extends from the reservoir. The catheter is placed into a large vein. Medicine that is inserted into the reservoir goes into the catheter and then into the vein.  How will I care for my incision site? Do not get the incision site wet. Bathe or shower as directed by your health care provider. How is my port accessed? Special steps must be taken to access the port:  Before the port is accessed, a numbing cream can be placed on the skin. This helps numb the skin over the port site.  Your health care provider uses a sterile technique to access the port. ? Your health care provider must put on a mask and sterile gloves. ? The skin over your port is cleaned carefully with an antiseptic and allowed to dry. ? The port is gently pinched between sterile gloves, and a needle is inserted into the port.  Only "non-coring" port needles should be used to access the port. Once the port is accessed, a blood return should be checked. This helps ensure that the port  is in the vein and is not clogged.  If your port needs to remain accessed for a constant infusion, a clear (transparent) bandage will be placed over the needle site. The bandage and needle will need to be changed every week, or as directed by your health care provider.  Keep the bandage covering the needle clean and dry. Do not get it wet. Follow your health care provider's instructions on how to take a shower or bath while the port is accessed.  If your port does not need to stay accessed, no bandage is needed over the port.  What is flushing? Flushing helps keep the port from getting clogged. Follow your health care provider's instructions on how and when to flush the port. Ports are usually flushed with saline solution or a medicine called heparin. The need for flushing will depend on how the port is used.  If the port is used for intermittent medicines or blood draws, the port will need to be flushed: ? After medicines have been given. ? After blood has been drawn. ? As part of routine maintenance.  If a constant infusion is running, the port may not need to be flushed.  How long will my port stay implanted? The port can stay in for as long as your health care provider thinks it is needed. When it is time for the port to come out, surgery will be   done to remove it. The procedure is similar to the one performed when the port was put in. When should I seek immediate medical care? When you have an implanted port, you should seek immediate medical care if:  You notice a bad smell coming from the incision site.  You have swelling, redness, or drainage at the incision site.  You have more swelling or pain at the port site or the surrounding area.  You have a fever that is not controlled with medicine.  This information is not intended to replace advice given to you by your health care provider. Make sure you discuss any questions you have with your health care provider. Document  Released: 07/11/2005 Document Revised: 12/17/2015 Document Reviewed: 03/18/2013 Elsevier Interactive Patient Education  2017 [employer]  

## 2017-07-07 ENCOUNTER — Ambulatory Visit: Payer: Self-pay | Admitting: Family

## 2017-07-07 ENCOUNTER — Other Ambulatory Visit: Payer: Self-pay

## 2017-07-07 LAB — C-REACTIVE PROTEIN: CRP: 2.8 mg/L (ref 0.0–4.9)

## 2017-07-07 LAB — RHEUMATOID FACTOR

## 2017-07-07 IMAGING — RF DG UGI W/ HIGH DENSITY W/KUB
14 of 19 series · 14 of 19 positions shown · non-contrast
Comparison: CT 03/22/2016

CLINICAL DATA: Post partial gastrectomy for gastric carcinoma.
Persistent LEFT-sided pain with nausea and diarrhea

EXAM:
UPPER GI SERIES WITH KUB
TECHNIQUE: After obtaining a scout radiograph a routine upper GI series was
performed using density with an and high density.
FLUOROSCOPY TIME:  Fluoroscopy Time:  1 minutes 38 seconds
Radiation Exposure Index (if provided by the fluoroscopic device):
93.0 mGy
Number of Acquired Spot Images: 19

[Series 1: run · 1 of 1 slices shown (1 of 14)]
[im 1/1]
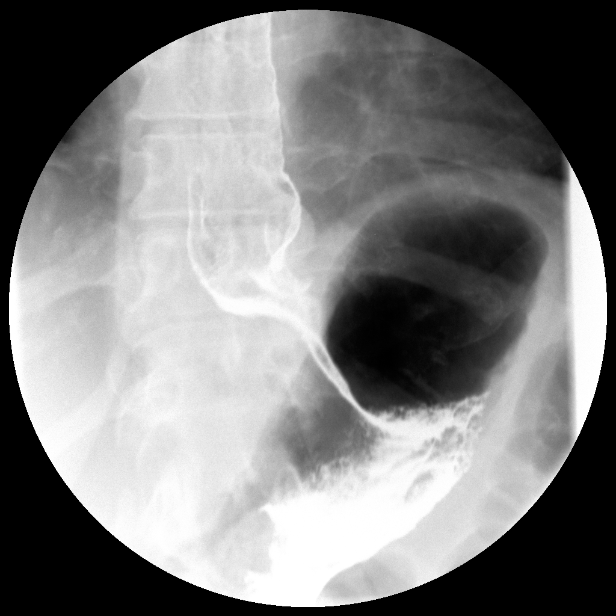

[Series 3: run · 1 of 1 slices shown (2 of 14)]
[im 1/1]
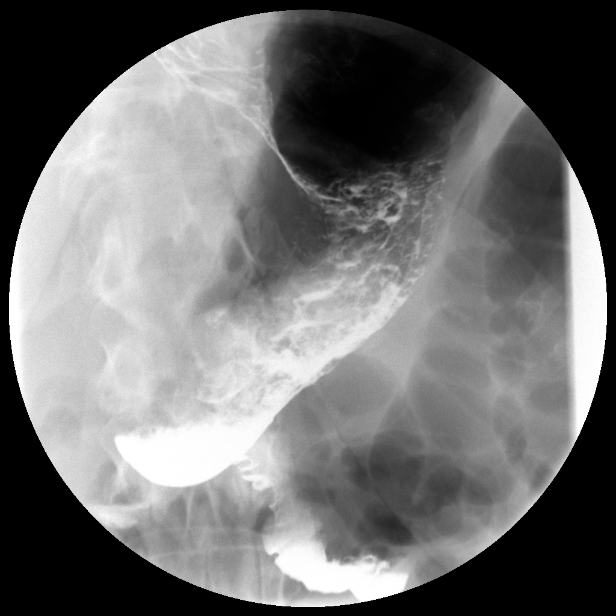

[Series 4: run · 1 of 1 slices shown (3 of 14)]
[im 1/1]
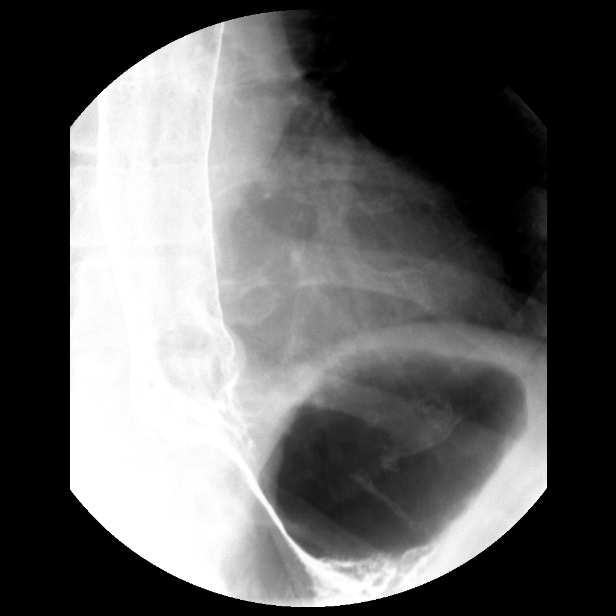

[Series 5: run · 1 of 1 slices shown (4 of 14)]
[im 1/1]
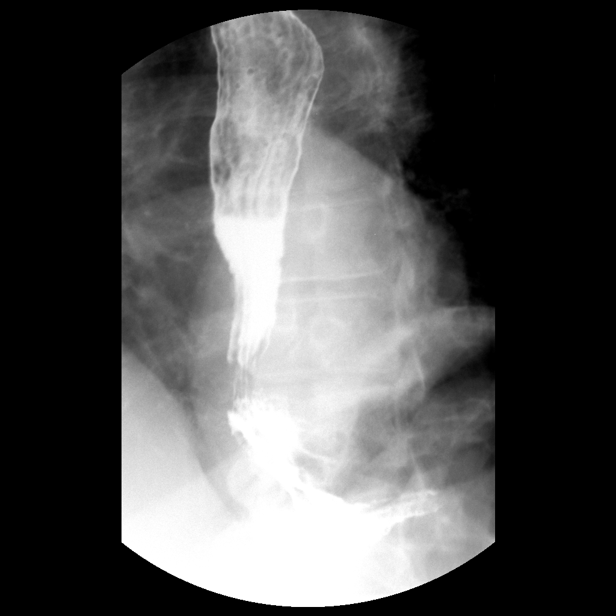

[Series 7: run · 1 of 1 slices shown (5 of 14)]
[im 1/1]
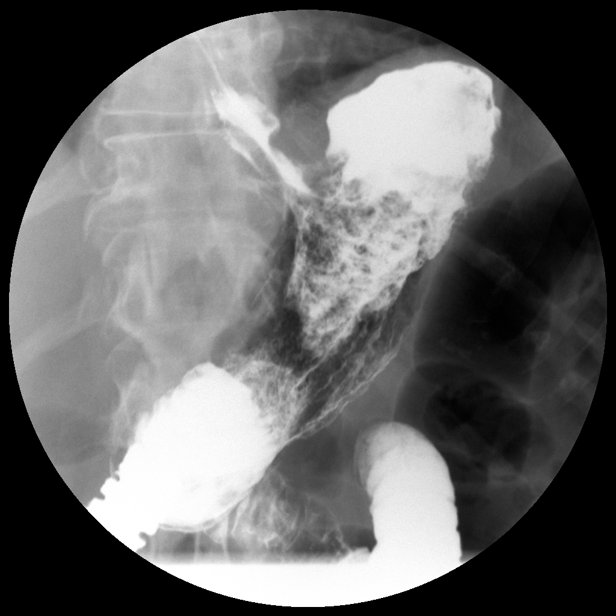

[Series 8: run · 1 of 1 slices shown (6 of 14)]
[im 1/1]
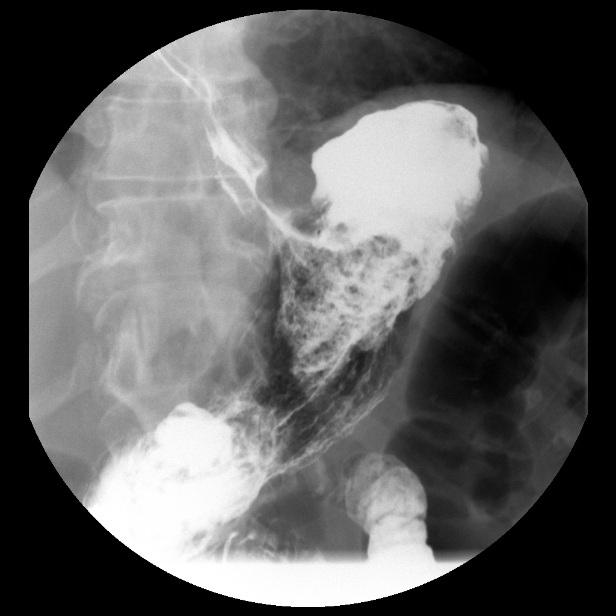

[Series 9: run · 1 of 1 slices shown (7 of 14)]
[im 1/1]
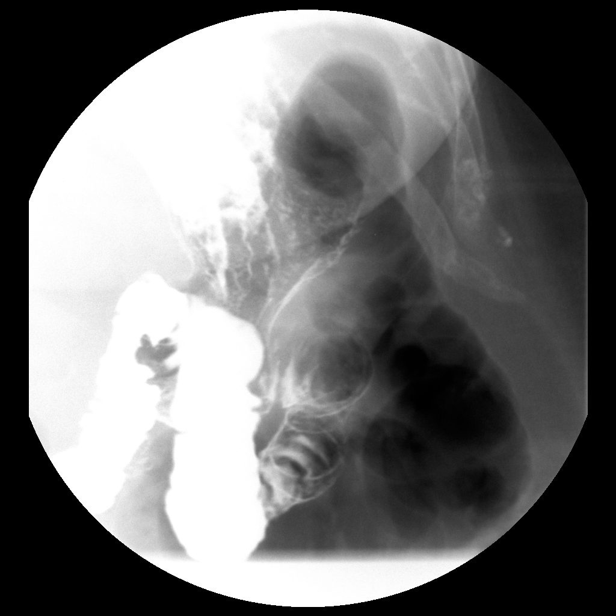

[Series 11: run · 1 of 1 slices shown (8 of 14)]
[im 1/1]
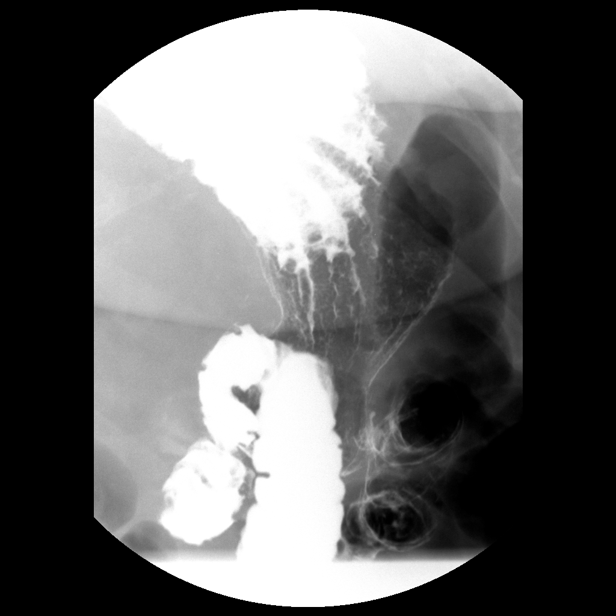

[Series 12: run · 1 of 1 slices shown (9 of 14)]
[im 1/1]
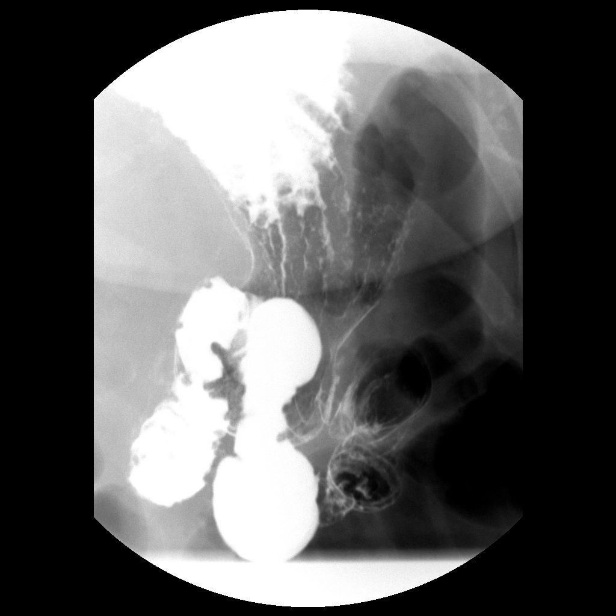

[Series 13: run · 1 of 1 slices shown (10 of 14)]
[im 1/1]
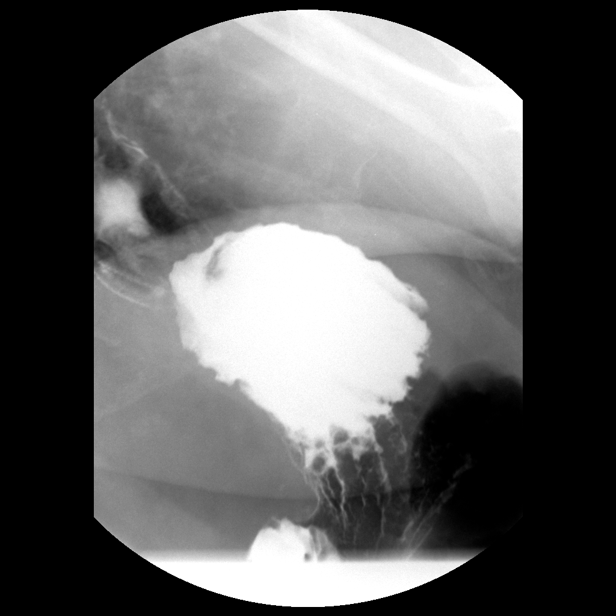

[Series 15: run · 1 of 1 slices shown (11 of 14)]
[im 1/1]
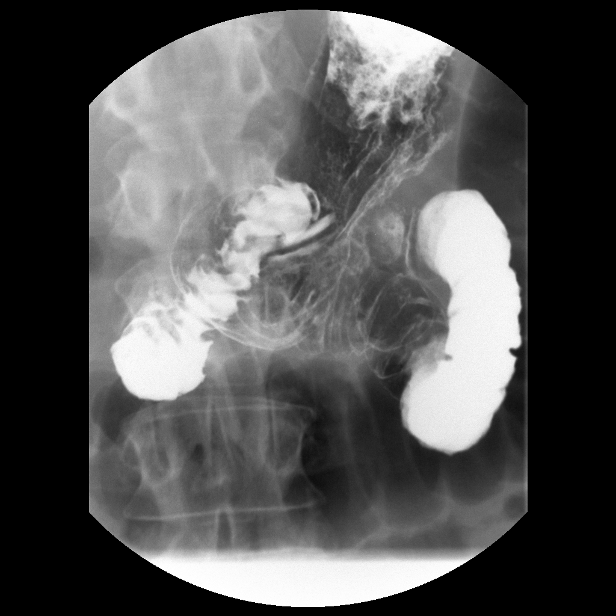

[Series 16: run · 1 of 1 slices shown (12 of 14)]
[im 1/1]
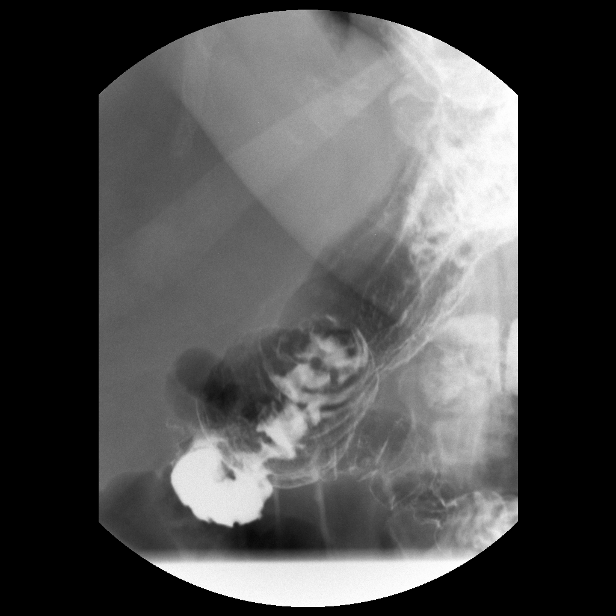

[Series 17: run · 1 of 1 slices shown (13 of 14)]
[im 1/1]
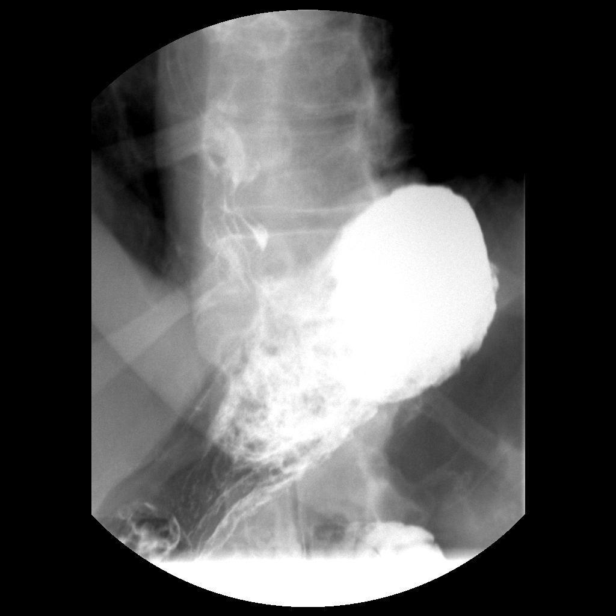

[Series 19: run · 1 of 1 slices shown (14 of 14)]
[im 1/1]
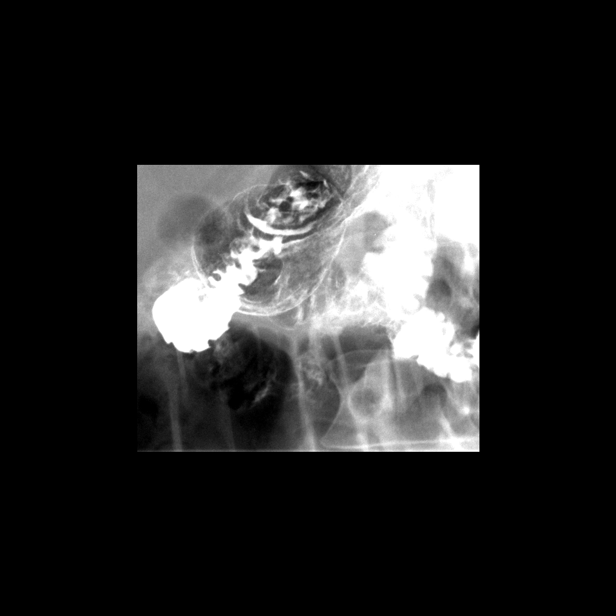

[14 of 19 positions shown; findings below may reference images not displayed]

FINDINGS: Initial KUB demonstrates no bowel obstruction.

Distal esophagus is normal. No mass, stricture, or obstruction in
the stomach. Normal mucosal pattern. Post partial gastrectomy with
gastroenteric anastomosis. No obstruction or inflammation at
anastomosis. The proximal duodenum is normal.
IMPRESSION: No mucosal irregularity, stricture or mass within the esophagus,
stomach, duodenum.

Post partial gas nephrectomy without complication.

## 2017-07-08 ENCOUNTER — Ambulatory Visit (HOSPITAL_BASED_OUTPATIENT_CLINIC_OR_DEPARTMENT_OTHER)
Admission: RE | Admit: 2017-07-08 | Discharge: 2017-07-08 | Disposition: A | Discharge status: Home / Self Care | Payer: Medicare HMO | Source: Ambulatory Visit | Attending: Family Medicine | Admitting: Family Medicine

## 2017-07-08 DIAGNOSIS — Z1231 Encounter for screening mammogram for malignant neoplasm of breast: Secondary | ICD-10-CM | POA: Diagnosis not present

## 2017-07-09 LAB — ANTINUCLEAR ANTIBODIES, IFA: ANTINUCLEAR ANTIBODIES, IFA: NEGATIVE

## 2017-07-10 ENCOUNTER — Other Ambulatory Visit: Payer: Self-pay

## 2017-07-10 ENCOUNTER — Ambulatory Visit: Payer: Self-pay | Admitting: Hematology & Oncology

## 2017-07-11 DIAGNOSIS — G894 Chronic pain syndrome: Secondary | ICD-10-CM | POA: Diagnosis not present

## 2017-07-11 DIAGNOSIS — E1142 Type 2 diabetes mellitus with diabetic polyneuropathy: Secondary | ICD-10-CM | POA: Diagnosis not present

## 2017-07-11 DIAGNOSIS — R69 Illness, unspecified: Secondary | ICD-10-CM | POA: Diagnosis not present

## 2017-07-11 DIAGNOSIS — G620 Drug-induced polyneuropathy: Secondary | ICD-10-CM | POA: Diagnosis not present

## 2017-07-11 DIAGNOSIS — Z79891 Long term (current) use of opiate analgesic: Secondary | ICD-10-CM | POA: Diagnosis not present

## 2017-07-13 ENCOUNTER — Ambulatory Visit (HOSPITAL_BASED_OUTPATIENT_CLINIC_OR_DEPARTMENT_OTHER): Payer: Medicare HMO

## 2017-07-13 VITALS — BP 128/83 | HR 92 | Temp 99.1°F | Resp 18

## 2017-07-13 DIAGNOSIS — C162 Malignant neoplasm of body of stomach: Secondary | ICD-10-CM

## 2017-07-13 DIAGNOSIS — D500 Iron deficiency anemia secondary to blood loss (chronic): Secondary | ICD-10-CM | POA: Diagnosis not present

## 2017-07-13 DIAGNOSIS — I950 Idiopathic hypotension: Secondary | ICD-10-CM

## 2017-07-13 MED ORDER — SODIUM CHLORIDE 0.9% FLUSH
10.0000 mL | INTRAVENOUS | Status: DC | PRN
Start: 2017-07-13 — End: 2017-07-13
  Administered 2017-07-13: 10 mL via INTRAVENOUS
  Filled 2017-07-13: qty 10

## 2017-07-13 MED ORDER — SODIUM CHLORIDE 0.9 % IV SOLN
Freq: Once | INTRAVENOUS | Status: AC
  Administered 2017-07-13: 08:00:00 via INTRAVENOUS

## 2017-07-13 MED ORDER — HEPARIN SOD (PORK) LOCK FLUSH 100 UNIT/ML IV SOLN
500.0000 [IU] | Freq: Once | INTRAVENOUS | Status: AC
  Administered 2017-07-13: 500 [IU] via INTRAVENOUS
  Filled 2017-07-13: qty 5

## 2017-07-13 MED ORDER — FERUMOXYTOL INJECTION 510 MG/17 ML
510.0000 mg | Freq: Once | INTRAVENOUS | Status: AC
  Administered 2017-07-13: 510 mg via INTRAVENOUS
  Filled 2017-07-13: qty 17

## 2017-07-13 NOTE — Patient Instructions (Signed)

## 2017-07-17 ENCOUNTER — Ambulatory Visit: Payer: Self-pay | Admitting: Hematology & Oncology

## 2017-07-17 ENCOUNTER — Other Ambulatory Visit: Payer: Self-pay

## 2017-08-16 ENCOUNTER — Other Ambulatory Visit: Payer: Self-pay | Admitting: Family Medicine

## 2017-08-16 DIAGNOSIS — G5793 Unspecified mononeuropathy of bilateral lower limbs: Secondary | ICD-10-CM

## 2017-08-16 DIAGNOSIS — G894 Chronic pain syndrome: Secondary | ICD-10-CM

## 2017-08-17 ENCOUNTER — Other Ambulatory Visit: Payer: Self-pay

## 2017-08-21 ENCOUNTER — Inpatient Hospital Stay: Payer: Medicare HMO | Attending: Hematology & Oncology

## 2017-08-21 ENCOUNTER — Other Ambulatory Visit: Payer: Self-pay

## 2017-08-21 VITALS — BP 140/81 | HR 104 | Temp 99.0°F | Resp 18

## 2017-08-21 DIAGNOSIS — Z85028 Personal history of other malignant neoplasm of stomach: Secondary | ICD-10-CM | POA: Diagnosis not present

## 2017-08-21 DIAGNOSIS — Z452 Encounter for adjustment and management of vascular access device: Secondary | ICD-10-CM | POA: Insufficient documentation

## 2017-08-21 DIAGNOSIS — Z95828 Presence of other vascular implants and grafts: Secondary | ICD-10-CM

## 2017-08-21 MED ORDER — SUCRALFATE 1 GM/10ML PO SUSP: 1.0000 g | Freq: Three times a day (TID) | ORAL | 4 refills | Status: DC

## 2017-08-21 MED ORDER — HEPARIN SOD (PORK) LOCK FLUSH 100 UNIT/ML IV SOLN
500.0000 [IU] | Freq: Once | INTRAVENOUS | Status: AC
  Administered 2017-08-21: 500 [IU] via INTRAVENOUS
  Filled 2017-08-21: qty 5

## 2017-08-21 MED ORDER — SODIUM CHLORIDE 0.9% FLUSH
10.0000 mL | INTRAVENOUS | Status: DC | PRN
  Administered 2017-08-21: 10 mL via INTRAVENOUS
  Filled 2017-08-21: qty 10

## 2017-08-25 DIAGNOSIS — Z79899 Other long term (current) drug therapy: Secondary | ICD-10-CM | POA: Diagnosis not present

## 2017-08-25 DIAGNOSIS — M545 Low back pain: Secondary | ICD-10-CM | POA: Diagnosis not present

## 2017-08-25 DIAGNOSIS — M129 Arthropathy, unspecified: Secondary | ICD-10-CM | POA: Diagnosis not present

## 2017-08-25 DIAGNOSIS — G620 Drug-induced polyneuropathy: Secondary | ICD-10-CM | POA: Diagnosis not present

## 2017-08-25 IMAGING — US US ABDOMEN COMPLETE
1 series · 14 of 25 positions shown · non-contrast
Comparison: CT 01/13/2016

CLINICAL DATA: Abdominal pain x2 weeks.  Stomach carcinoma.

EXAM:
COMPLETE ABDOMINAL ULTRASOUND

[Series 1: us abdomen complete · 0.21mm/px · 14 of 100 slices shown]
[im 1/100]
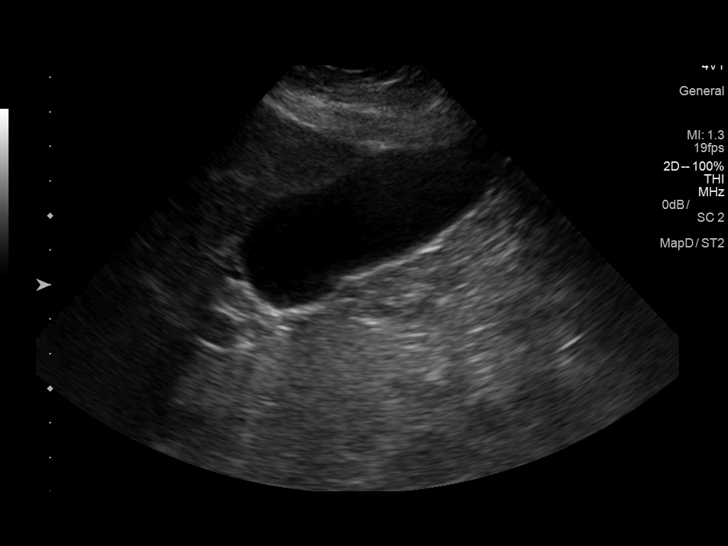
[im 9/100]
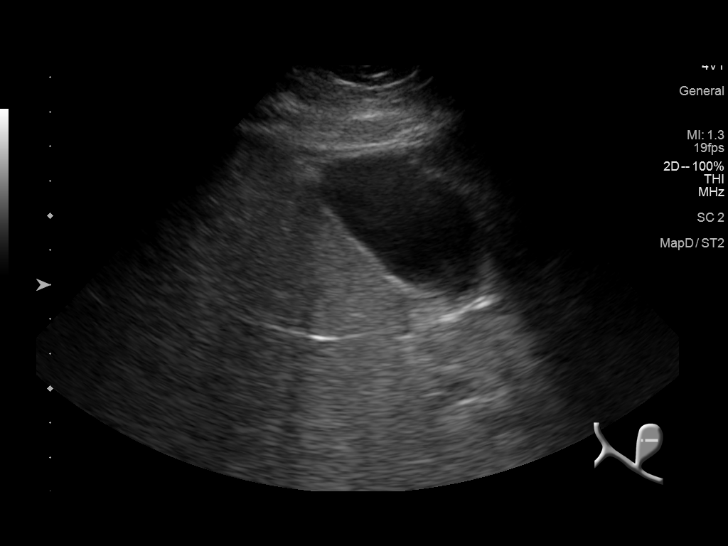
[im 17/100]
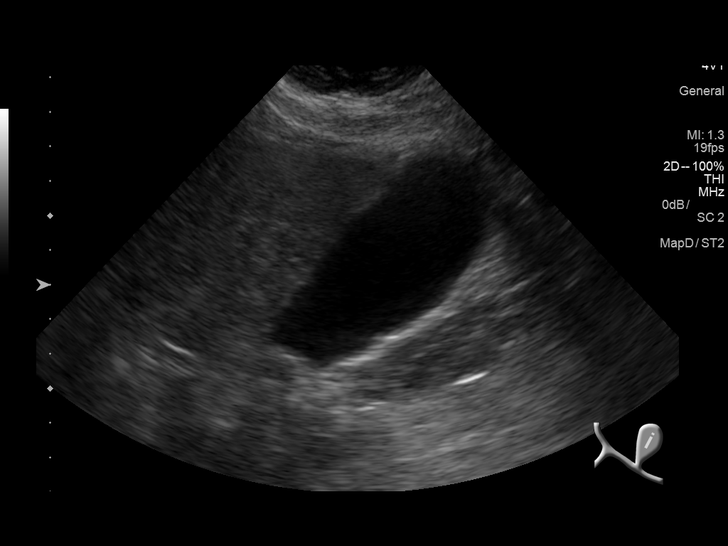
[im 25/100]
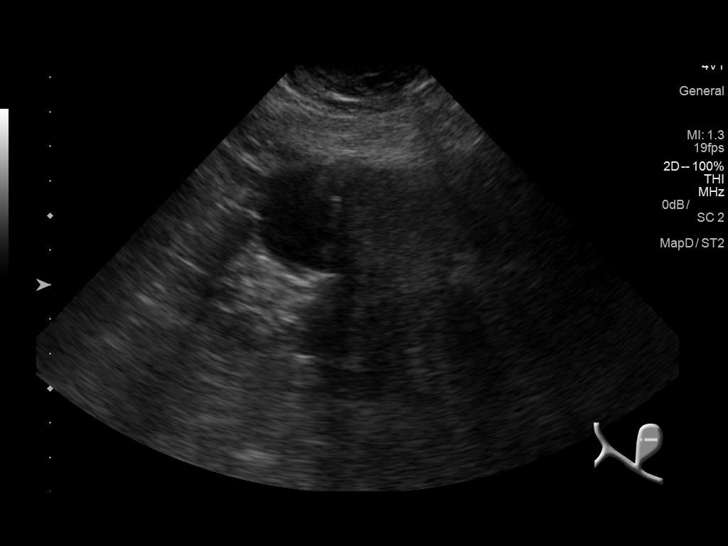
[im 34/100]
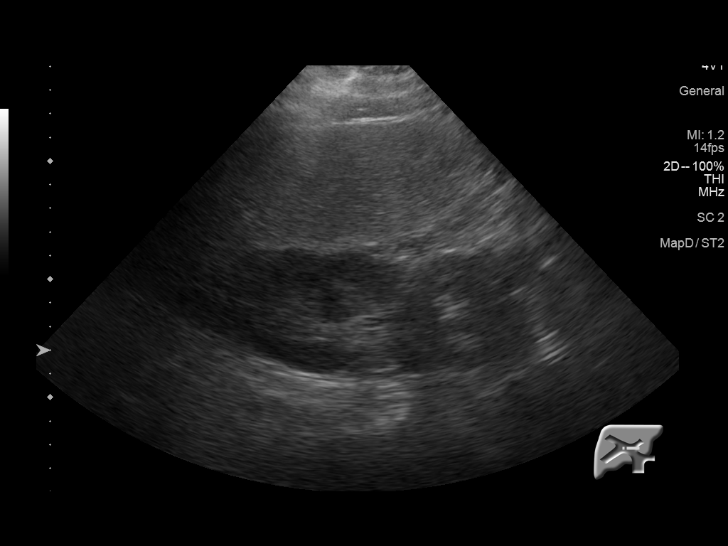
[im 38/100]
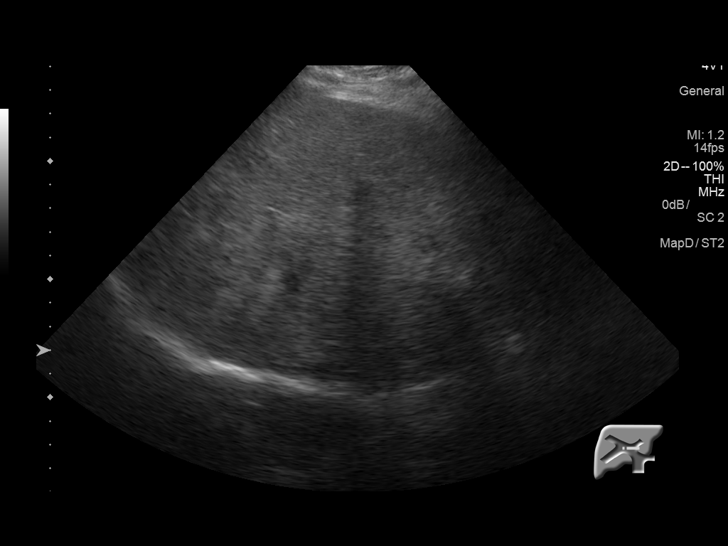
[im 46/100]
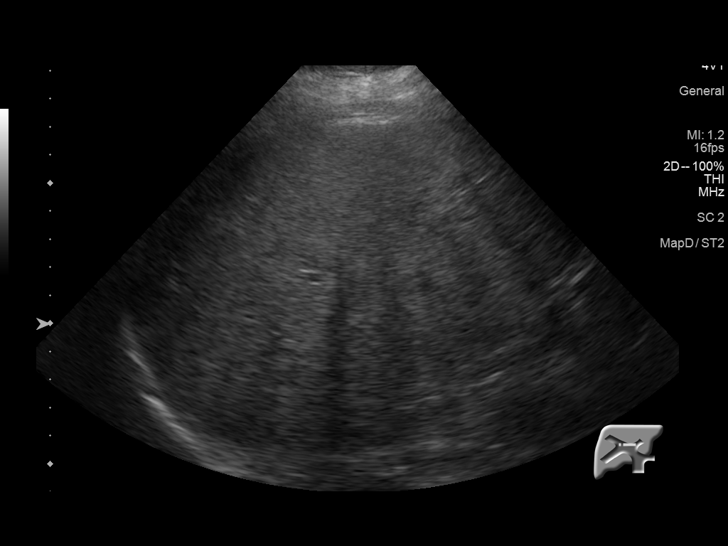
[im 54/100]
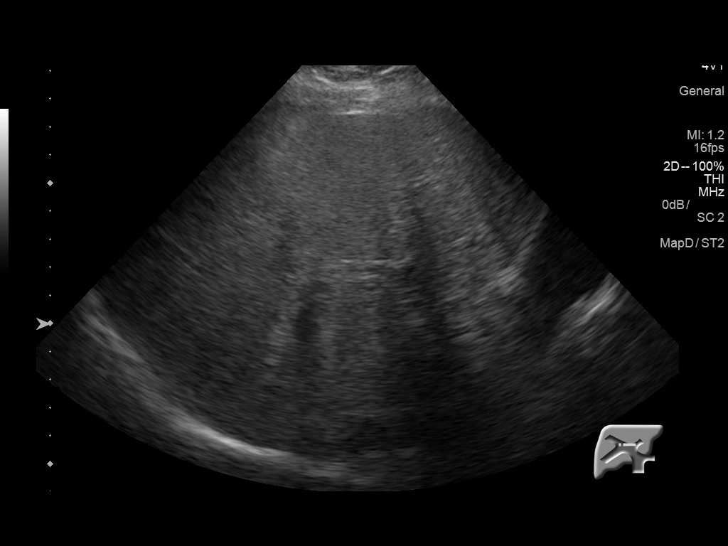
[im 62/100]
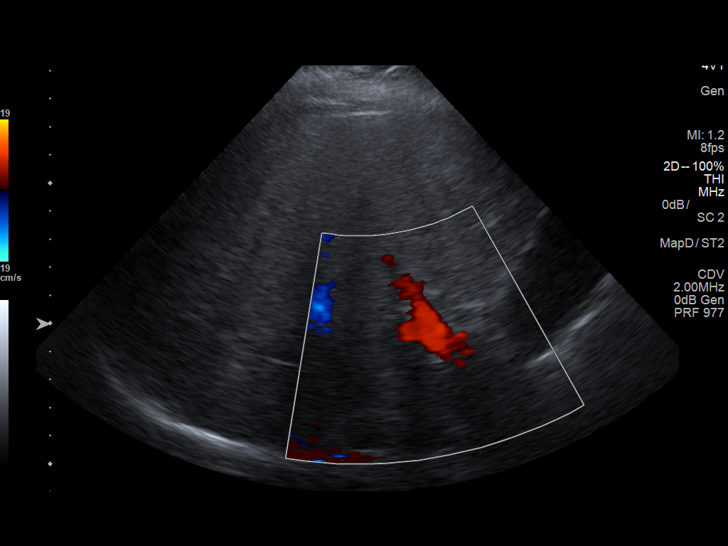
[im 67/100]
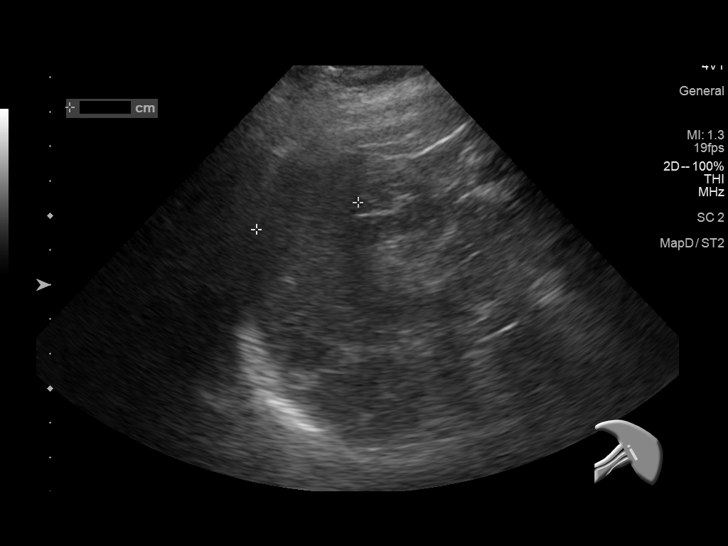
[im 75/100]
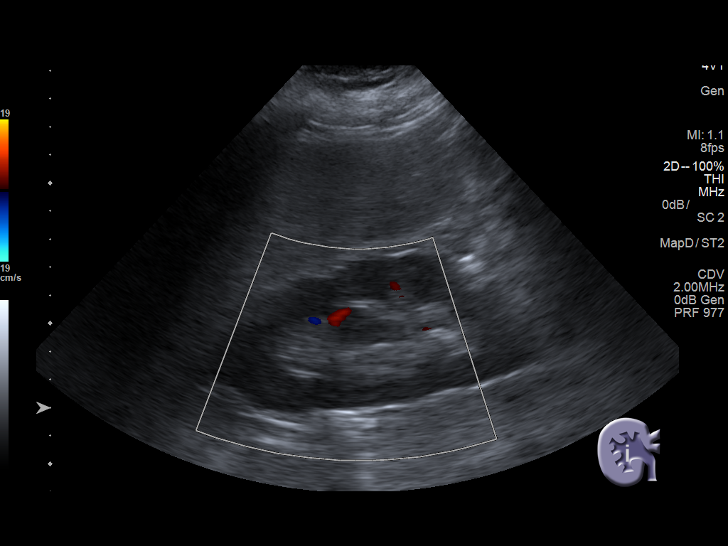
[im 83/100]
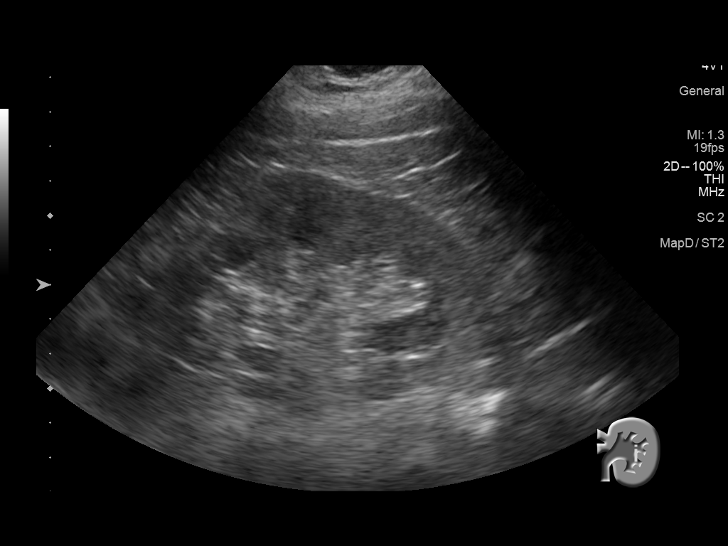
[im 91/100]
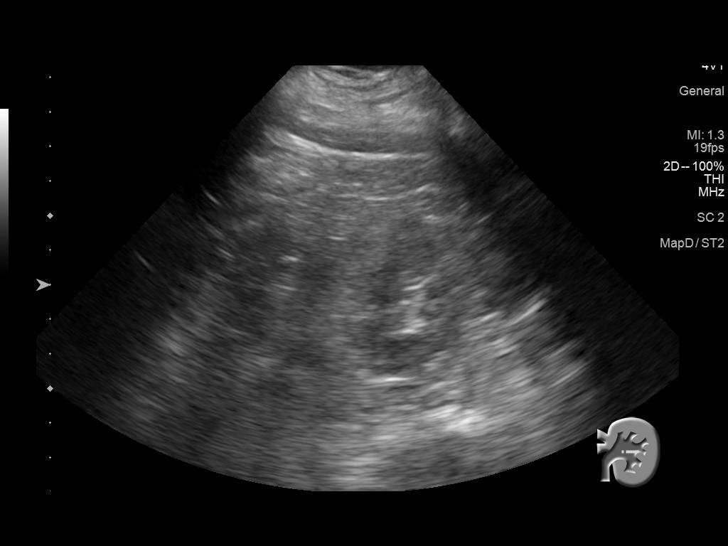
[im 100/100]
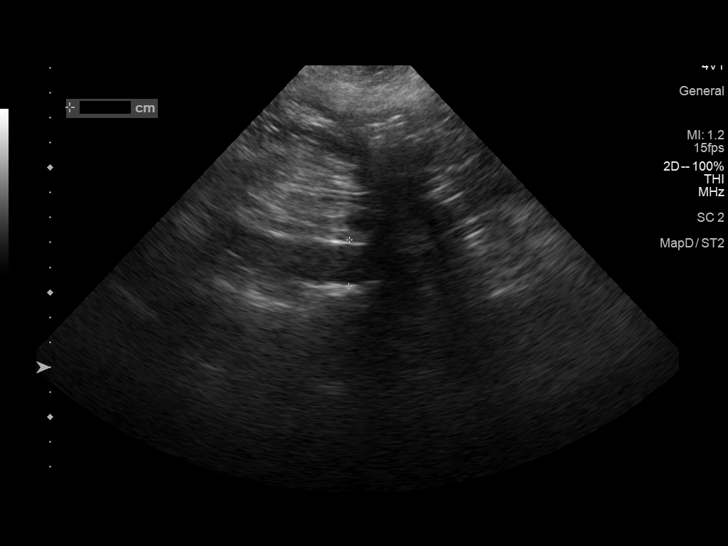

[14 of 25 positions shown; findings below may reference images not displayed]

FINDINGS: Gallbladder: Physiologically distended without stones, wall
thickening, or pericholecystic fluid. Sonographer reports no
sonographic Murphy's sign.

Common bile duct:  Normal in caliber, 5.1mm diameter.

Liver: Coarse echotexture without focal lesion or intrahepatic
biliary ductal dilatation.

IVC:  Negative

Pancreas: Visualized segments unremarkable, portions obscured by
overlying bowel gas.

Spleen:  No focal lesion, craniocaudal 3.0cm in length.

Right Kidney:  No mass or hydronephrosis, 11.6cm in length.

Left Kidney:  No lesion or hydronephrosis, 10.1cm in length.

Abdominal aorta:  Negative
IMPRESSION: Negative.  Normal gallbladder.

## 2017-08-26 DIAGNOSIS — M545 Low back pain: Secondary | ICD-10-CM | POA: Diagnosis not present

## 2017-08-29 ENCOUNTER — Telehealth: Payer: Self-pay | Admitting: Hematology & Oncology

## 2017-08-29 NOTE — Telephone Encounter (Signed)
Faxed medical records to: [facility] P: [phone] F: [phone]         COPY SCANNED

## 2017-09-01 DIAGNOSIS — M129 Arthropathy, unspecified: Secondary | ICD-10-CM | POA: Diagnosis not present

## 2017-09-01 DIAGNOSIS — M545 Low back pain: Secondary | ICD-10-CM | POA: Diagnosis not present

## 2017-09-01 DIAGNOSIS — G620 Drug-induced polyneuropathy: Secondary | ICD-10-CM | POA: Diagnosis not present

## 2017-09-01 DIAGNOSIS — N183 Chronic kidney disease, stage 3 (moderate): Secondary | ICD-10-CM | POA: Diagnosis not present

## 2017-09-15 ENCOUNTER — Other Ambulatory Visit: Payer: Self-pay | Admitting: Family Medicine

## 2017-09-15 DIAGNOSIS — E1151 Type 2 diabetes mellitus with diabetic peripheral angiopathy without gangrene: Secondary | ICD-10-CM

## 2017-09-15 DIAGNOSIS — IMO0002 Reserved for concepts with insufficient information to code with codable children: Secondary | ICD-10-CM

## 2017-09-15 DIAGNOSIS — E1165 Type 2 diabetes mellitus with hyperglycemia: Principal | ICD-10-CM

## 2017-09-28 ENCOUNTER — Other Ambulatory Visit: Payer: Self-pay

## 2017-09-29 DIAGNOSIS — M545 Low back pain: Secondary | ICD-10-CM | POA: Diagnosis not present

## 2017-09-29 DIAGNOSIS — J209 Acute bronchitis, unspecified: Secondary | ICD-10-CM | POA: Diagnosis not present

## 2017-09-29 DIAGNOSIS — G629 Polyneuropathy, unspecified: Secondary | ICD-10-CM | POA: Diagnosis not present

## 2017-09-29 DIAGNOSIS — Z79899 Other long term (current) drug therapy: Secondary | ICD-10-CM | POA: Diagnosis not present

## 2017-10-06 DIAGNOSIS — J40 Bronchitis, not specified as acute or chronic: Secondary | ICD-10-CM | POA: Diagnosis not present

## 2017-10-06 DIAGNOSIS — J029 Acute pharyngitis, unspecified: Secondary | ICD-10-CM | POA: Diagnosis not present

## 2017-10-13 DIAGNOSIS — J209 Acute bronchitis, unspecified: Secondary | ICD-10-CM | POA: Diagnosis not present

## 2017-10-13 DIAGNOSIS — J189 Pneumonia, unspecified organism: Secondary | ICD-10-CM | POA: Diagnosis not present

## 2017-10-13 DIAGNOSIS — R0981 Nasal congestion: Secondary | ICD-10-CM | POA: Diagnosis not present

## 2017-10-13 DIAGNOSIS — B373 Candidiasis of vulva and vagina: Secondary | ICD-10-CM | POA: Diagnosis not present

## 2017-10-15 ENCOUNTER — Other Ambulatory Visit: Payer: Self-pay | Admitting: Family Medicine

## 2017-10-15 DIAGNOSIS — IMO0002 Reserved for concepts with insufficient information to code with codable children: Secondary | ICD-10-CM

## 2017-10-15 DIAGNOSIS — E1151 Type 2 diabetes mellitus with diabetic peripheral angiopathy without gangrene: Secondary | ICD-10-CM

## 2017-10-15 DIAGNOSIS — E1165 Type 2 diabetes mellitus with hyperglycemia: Principal | ICD-10-CM

## 2017-11-02 ENCOUNTER — Inpatient Hospital Stay: Payer: Self-pay

## 2017-11-02 ENCOUNTER — Emergency Department (HOSPITAL_BASED_OUTPATIENT_CLINIC_OR_DEPARTMENT_OTHER): Payer: Medicare HMO

## 2017-11-02 ENCOUNTER — Inpatient Hospital Stay: Payer: Self-pay | Admitting: Family

## 2017-11-02 ENCOUNTER — Other Ambulatory Visit: Payer: Self-pay

## 2017-11-02 ENCOUNTER — Encounter: Payer: Self-pay | Admitting: *Deleted

## 2017-11-02 ENCOUNTER — Encounter (HOSPITAL_BASED_OUTPATIENT_CLINIC_OR_DEPARTMENT_OTHER): Payer: Self-pay | Admitting: Emergency Medicine

## 2017-11-02 ENCOUNTER — Telehealth: Payer: Self-pay

## 2017-11-02 ENCOUNTER — Inpatient Hospital Stay (HOSPITAL_BASED_OUTPATIENT_CLINIC_OR_DEPARTMENT_OTHER)
Admission: EM | Admit: 2017-11-02 | Discharge: 2017-11-05 | DRG: 871 | Disposition: A | Discharge status: Home / Self Care | Payer: Medicare HMO | Attending: Family Medicine | Admitting: Family Medicine

## 2017-11-02 DIAGNOSIS — R0602 Shortness of breath: Secondary | ICD-10-CM | POA: Diagnosis present

## 2017-11-02 DIAGNOSIS — A419 Sepsis, unspecified organism: Secondary | ICD-10-CM | POA: Diagnosis not present

## 2017-11-02 DIAGNOSIS — Z9221 Personal history of antineoplastic chemotherapy: Secondary | ICD-10-CM | POA: Diagnosis not present

## 2017-11-02 DIAGNOSIS — R000 Tachycardia, unspecified: Secondary | ICD-10-CM | POA: Diagnosis not present

## 2017-11-02 DIAGNOSIS — Z96641 Presence of right artificial hip joint: Secondary | ICD-10-CM | POA: Diagnosis present

## 2017-11-02 DIAGNOSIS — E1121 Type 2 diabetes mellitus with diabetic nephropathy: Secondary | ICD-10-CM | POA: Diagnosis present

## 2017-11-02 DIAGNOSIS — J9610 Chronic respiratory failure, unspecified whether with hypoxia or hypercapnia: Secondary | ICD-10-CM | POA: Diagnosis present

## 2017-11-02 DIAGNOSIS — N183 Chronic kidney disease, stage 3 unspecified: Secondary | ICD-10-CM | POA: Diagnosis present

## 2017-11-02 DIAGNOSIS — K219 Gastro-esophageal reflux disease without esophagitis: Secondary | ICD-10-CM | POA: Diagnosis present

## 2017-11-02 DIAGNOSIS — J9611 Chronic respiratory failure with hypoxia: Secondary | ICD-10-CM

## 2017-11-02 DIAGNOSIS — D72829 Elevated white blood cell count, unspecified: Secondary | ICD-10-CM | POA: Diagnosis present

## 2017-11-02 DIAGNOSIS — R231 Pallor: Secondary | ICD-10-CM | POA: Diagnosis not present

## 2017-11-02 DIAGNOSIS — Z923 Personal history of irradiation: Secondary | ICD-10-CM

## 2017-11-02 DIAGNOSIS — Z79899 Other long term (current) drug therapy: Secondary | ICD-10-CM | POA: Diagnosis not present

## 2017-11-02 DIAGNOSIS — J180 Bronchopneumonia, unspecified organism: Secondary | ICD-10-CM | POA: Diagnosis present

## 2017-11-02 DIAGNOSIS — Z888 Allergy status to other drugs, medicaments and biological substances status: Secondary | ICD-10-CM | POA: Diagnosis not present

## 2017-11-02 DIAGNOSIS — J45909 Unspecified asthma, uncomplicated: Secondary | ICD-10-CM | POA: Diagnosis present

## 2017-11-02 DIAGNOSIS — N179 Acute kidney failure, unspecified: Secondary | ICD-10-CM | POA: Diagnosis present

## 2017-11-02 DIAGNOSIS — D573 Sickle-cell trait: Secondary | ICD-10-CM | POA: Diagnosis present

## 2017-11-02 DIAGNOSIS — E1122 Type 2 diabetes mellitus with diabetic chronic kidney disease: Secondary | ICD-10-CM | POA: Diagnosis present

## 2017-11-02 DIAGNOSIS — Z885 Allergy status to narcotic agent status: Secondary | ICD-10-CM | POA: Diagnosis not present

## 2017-11-02 DIAGNOSIS — K210 Gastro-esophageal reflux disease with esophagitis: Secondary | ICD-10-CM

## 2017-11-02 DIAGNOSIS — Z7982 Long term (current) use of aspirin: Secondary | ICD-10-CM | POA: Diagnosis not present

## 2017-11-02 DIAGNOSIS — J40 Bronchitis, not specified as acute or chronic: Secondary | ICD-10-CM | POA: Diagnosis present

## 2017-11-02 DIAGNOSIS — Z91048 Other nonmedicinal substance allergy status: Secondary | ICD-10-CM

## 2017-11-02 DIAGNOSIS — E1159 Type 2 diabetes mellitus with other circulatory complications: Secondary | ICD-10-CM

## 2017-11-02 DIAGNOSIS — Z880 Allergy status to penicillin: Secondary | ICD-10-CM | POA: Diagnosis not present

## 2017-11-02 DIAGNOSIS — Z7984 Long term (current) use of oral hypoglycemic drugs: Secondary | ICD-10-CM | POA: Diagnosis not present

## 2017-11-02 DIAGNOSIS — Z96659 Presence of unspecified artificial knee joint: Secondary | ICD-10-CM | POA: Diagnosis present

## 2017-11-02 DIAGNOSIS — J189 Pneumonia, unspecified organism: Secondary | ICD-10-CM | POA: Diagnosis not present

## 2017-11-02 DIAGNOSIS — Z8673 Personal history of transient ischemic attack (TIA), and cerebral infarction without residual deficits: Secondary | ICD-10-CM | POA: Diagnosis not present

## 2017-11-02 DIAGNOSIS — J181 Lobar pneumonia, unspecified organism: Secondary | ICD-10-CM

## 2017-11-02 DIAGNOSIS — E1140 Type 2 diabetes mellitus with diabetic neuropathy, unspecified: Secondary | ICD-10-CM | POA: Diagnosis present

## 2017-11-02 DIAGNOSIS — Z85028 Personal history of other malignant neoplasm of stomach: Secondary | ICD-10-CM | POA: Diagnosis not present

## 2017-11-02 DIAGNOSIS — K760 Fatty (change of) liver, not elsewhere classified: Secondary | ICD-10-CM | POA: Diagnosis not present

## 2017-11-02 LAB — CBC WITH DIFFERENTIAL/PLATELET
Basophils Absolute: 0 10*3/uL (ref 0.0–0.1)
Basophils Relative: 0 %
EOS PCT: 0 %
Eosinophils Absolute: 0.1 10*3/uL (ref 0.0–0.7)
HCT: 37 % (ref 36.0–46.0)
Hemoglobin: 12.5 g/dL (ref 12.0–15.0)
LYMPHS PCT: 8 %
Lymphs Abs: 1.2 10*3/uL (ref 0.7–4.0)
MCH: 30 pg (ref 26.0–34.0)
MCHC: 33.8 g/dL (ref 30.0–36.0)
MCV: 88.9 fL (ref 78.0–100.0)
MONO ABS: 1.2 10*3/uL — AB (ref 0.1–1.0)
Monocytes Relative: 8 %
Neutro Abs: 12.3 10*3/uL — ABNORMAL HIGH (ref 1.7–7.7)
Neutrophils Relative %: 84 %
PLATELETS: 313 10*3/uL (ref 150–400)
RBC: 4.16 MIL/uL (ref 3.87–5.11)
RDW: 14.5 % (ref 11.5–15.5)
WBC: 14.7 10*3/uL — ABNORMAL HIGH (ref 4.0–10.5)

## 2017-11-02 LAB — COMPREHENSIVE METABOLIC PANEL
ALBUMIN: 2.9 g/dL — AB (ref 3.5–5.0)
ALK PHOS: 109 U/L (ref 38–126)
ALT: 47 U/L (ref 14–54)
AST: 60 U/L — AB (ref 15–41)
Anion gap: 11 (ref 5–15)
BILIRUBIN TOTAL: 0.7 mg/dL (ref 0.3–1.2)
BUN: 24 mg/dL — AB (ref 6–20)
CALCIUM: 8.8 mg/dL — AB (ref 8.9–10.3)
CO2: 21 mmol/L — ABNORMAL LOW (ref 22–32)
Chloride: 108 mmol/L (ref 101–111)
Creatinine, Ser: 1.3 mg/dL — ABNORMAL HIGH (ref 0.44–1.00)
GFR calc Af Amer: 49 mL/min — ABNORMAL LOW (ref >60)
GFR calc non Af Amer: 42 mL/min — ABNORMAL LOW (ref >60)
GLUCOSE: 87 mg/dL (ref 65–99)
POTASSIUM: 5.1 mmol/L (ref 3.5–5.1)
Sodium: 140 mmol/L (ref 135–145)
Total Protein: 6.6 g/dL (ref 6.5–8.1)

## 2017-11-02 LAB — URINALYSIS, ROUTINE W REFLEX MICROSCOPIC
Bilirubin Urine: NEGATIVE
Glucose, UA: NEGATIVE mg/dL
Hgb urine dipstick: NEGATIVE
KETONES UR: NEGATIVE mg/dL
LEUKOCYTES UA: NEGATIVE
NITRITE: NEGATIVE
PH: 6 (ref 5.0–8.0)
PROTEIN: NEGATIVE mg/dL
Specific Gravity, Urine: 1.01 (ref 1.005–1.030)

## 2017-11-02 LAB — I-STAT CG4 LACTIC ACID, ED
Lactic Acid, Venous: 3 mmol/L (ref 0.5–1.9)
Lactic Acid, Venous: 2.86 mmol/L (ref 0.5–1.9)

## 2017-11-02 LAB — MRSA PCR SCREENING: MRSA by PCR: NEGATIVE

## 2017-11-02 LAB — TROPONIN I

## 2017-11-02 LAB — BRAIN NATRIURETIC PEPTIDE: B NATRIURETIC PEPTIDE 5: 41.6 pg/mL (ref 0.0–100.0)

## 2017-11-02 LAB — INFLUENZA PANEL BY PCR (TYPE A & B)
Influenza A By PCR: NEGATIVE
Influenza B By PCR: NEGATIVE

## 2017-11-02 LAB — GLUCOSE, CAPILLARY: Glucose-Capillary: 83 mg/dL (ref 65–99)

## 2017-11-02 LAB — LIPASE, BLOOD: LIPASE: 16 U/L (ref 11–51)

## 2017-11-02 MED ORDER — MORPHINE SULFATE ER 30 MG PO TBCR
30.0000 mg | EXTENDED_RELEASE_TABLET | Freq: Two times a day (BID) | ORAL | Status: DC
  Administered 2017-11-02 – 2017-11-05 (×6): 30 mg via ORAL
  Filled 2017-11-02 (×6): qty 1

## 2017-11-02 MED ORDER — SODIUM CHLORIDE 0.9 % IV SOLN
1.0000 g | Freq: Three times a day (TID) | INTRAVENOUS | Status: DC
  Administered 2017-11-02 – 2017-11-03 (×3): 1 g via INTRAVENOUS
  Filled 2017-11-02 (×4): qty 1

## 2017-11-02 MED ORDER — MORPHINE-NALTREXONE 50-2 MG PO CPCR: 1.0000 | ORAL_CAPSULE | Freq: Every day | ORAL | Status: DC

## 2017-11-02 MED ORDER — IBUPROFEN 400 MG PO TABS
400.0000 mg | ORAL_TABLET | Freq: Once | ORAL | Status: AC
  Administered 2017-11-02: 400 mg via ORAL
  Filled 2017-11-02: qty 1

## 2017-11-02 MED ORDER — ACETAMINOPHEN 325 MG PO TABS
650.0000 mg | ORAL_TABLET | Freq: Once | ORAL | Status: AC
  Administered 2017-11-02: 650 mg via ORAL
  Filled 2017-11-02: qty 2

## 2017-11-02 MED ORDER — PIPERACILLIN-TAZOBACTAM 3.375 G IVPB
3.3750 g | Freq: Three times a day (TID) | INTRAVENOUS | Status: DC
  Administered 2017-11-02: 3.375 g via INTRAVENOUS
  Filled 2017-11-02: qty 50

## 2017-11-02 MED ORDER — PIPERACILLIN-TAZOBACTAM 3.375 G IVPB 30 MIN
3.3750 g | Freq: Once | INTRAVENOUS | Status: AC
  Administered 2017-11-02: 3.375 g via INTRAVENOUS
  Filled 2017-11-02 (×2): qty 50

## 2017-11-02 MED ORDER — IPRATROPIUM-ALBUTEROL 0.5-2.5 (3) MG/3ML IN SOLN: 3.0000 mL | Freq: Four times a day (QID) | RESPIRATORY_TRACT | Status: DC | PRN

## 2017-11-02 MED ORDER — SODIUM CHLORIDE 0.9 % IV BOLUS (SEPSIS)
1000.0000 mL | Freq: Once | INTRAVENOUS | Status: AC
  Administered 2017-11-02: 1000 mL via INTRAVENOUS

## 2017-11-02 MED ORDER — VANCOMYCIN HCL IN DEXTROSE 1-5 GM/200ML-% IV SOLN
1000.0000 mg | INTRAVENOUS | Status: DC
  Administered 2017-11-02: 1000 mg via INTRAVENOUS
  Filled 2017-11-02: qty 200

## 2017-11-02 MED ORDER — MORPHINE SULFATE (PF) 4 MG/ML IV SOLN
4.0000 mg | Freq: Once | INTRAVENOUS | Status: AC
  Administered 2017-11-02: 4 mg via INTRAVENOUS
  Filled 2017-11-02: qty 1

## 2017-11-02 MED ORDER — ADULT MULTIVITAMIN W/MINERALS CH
1.0000 | ORAL_TABLET | Freq: Every day | ORAL | Status: DC
  Administered 2017-11-03 – 2017-11-05 (×3): 1 via ORAL
  Filled 2017-11-02 (×3): qty 1

## 2017-11-02 MED ORDER — SUCRALFATE 1 GM/10ML PO SUSP
1.0000 g | Freq: Three times a day (TID) | ORAL | Status: DC
  Administered 2017-11-02 – 2017-11-05 (×10): 1 g via ORAL
  Filled 2017-11-02 (×10): qty 10

## 2017-11-02 MED ORDER — DULOXETINE HCL 60 MG PO CPEP
60.0000 mg | ORAL_CAPSULE | Freq: Two times a day (BID) | ORAL | Status: DC
  Administered 2017-11-02 – 2017-11-05 (×6): 60 mg via ORAL
  Filled 2017-11-02 (×2): qty 1
  Filled 2017-11-02: qty 2
  Filled 2017-11-02 (×2): qty 1
  Filled 2017-11-02: qty 2

## 2017-11-02 MED ORDER — VANCOMYCIN HCL IN DEXTROSE 1-5 GM/200ML-% IV SOLN
1000.0000 mg | Freq: Once | INTRAVENOUS | Status: AC
  Administered 2017-11-02: 1000 mg via INTRAVENOUS
  Filled 2017-11-02: qty 200

## 2017-11-02 MED ORDER — SODIUM CHLORIDE 0.9 % IV SOLN
INTRAVENOUS | Status: DC
  Administered 2017-11-02 – 2017-11-05 (×3): via INTRAVENOUS

## 2017-11-02 MED ORDER — PANTOPRAZOLE SODIUM 40 MG PO TBEC: 40.0000 mg | DELAYED_RELEASE_TABLET | Freq: Every day | ORAL | Status: DC

## 2017-11-02 MED ORDER — GLIMEPIRIDE 4 MG PO TABS
4.0000 mg | ORAL_TABLET | Freq: Two times a day (BID) | ORAL | Status: DC
  Administered 2017-11-03 – 2017-11-04 (×4): 4 mg via ORAL
  Filled 2017-11-02 (×5): qty 1

## 2017-11-02 MED ORDER — VANCOMYCIN HCL IN DEXTROSE 750-5 MG/150ML-% IV SOLN
750.0000 mg | Freq: Two times a day (BID) | INTRAVENOUS | Status: DC
  Filled 2017-11-02: qty 150

## 2017-11-02 MED ORDER — ASPIRIN EC 81 MG PO TBEC
81.0000 mg | DELAYED_RELEASE_TABLET | Freq: Every day | ORAL | Status: DC
  Administered 2017-11-02 – 2017-11-04 (×3): 81 mg via ORAL
  Filled 2017-11-02 (×3): qty 1

## 2017-11-02 MED ORDER — CYCLOBENZAPRINE HCL 5 MG PO TABS: 5.0000 mg | ORAL_TABLET | Freq: Three times a day (TID) | ORAL | Status: DC | PRN

## 2017-11-02 MED ORDER — GABAPENTIN 400 MG PO CAPS
800.0000 mg | ORAL_CAPSULE | Freq: Three times a day (TID) | ORAL | Status: DC
  Administered 2017-11-02 – 2017-11-05 (×8): 800 mg via ORAL
  Filled 2017-11-02 (×8): qty 2

## 2017-11-02 MED ORDER — INSULIN ASPART 100 UNIT/ML ~~LOC~~ SOLN
0.0000 [IU] | Freq: Three times a day (TID) | SUBCUTANEOUS | Status: DC
  Administered 2017-11-03: 3 [IU] via SUBCUTANEOUS
  Administered 2017-11-04: 5 [IU] via SUBCUTANEOUS
  Administered 2017-11-04: 3 [IU] via SUBCUTANEOUS

## 2017-11-02 MED ORDER — HEPARIN SODIUM (PORCINE) 5000 UNIT/ML IJ SOLN
5000.0000 [IU] | Freq: Three times a day (TID) | INTRAMUSCULAR | Status: DC
  Administered 2017-11-02 – 2017-11-05 (×8): 5000 [IU] via SUBCUTANEOUS
  Filled 2017-11-02 (×8): qty 1

## 2017-11-02 MED ORDER — ONDANSETRON HCL 4 MG/2ML IJ SOLN
4.0000 mg | Freq: Once | INTRAMUSCULAR | Status: AC
  Administered 2017-11-02: 4 mg via INTRAVENOUS
  Filled 2017-11-02: qty 2

## 2017-11-02 MED ORDER — ACETAMINOPHEN 325 MG PO TABS: 650.0000 mg | ORAL_TABLET | Freq: Once | ORAL | Status: DC

## 2017-11-02 MED ORDER — TRAMADOL HCL 50 MG PO TABS
50.0000 mg | ORAL_TABLET | Freq: Three times a day (TID) | ORAL | Status: DC | PRN
  Administered 2017-11-04 (×2): 50 mg via ORAL
  Filled 2017-11-02 (×2): qty 1

## 2017-11-02 MED ORDER — IOPAMIDOL (ISOVUE-300) INJECTION 61%
100.0000 mL | Freq: Once | INTRAVENOUS | Status: AC | PRN
  Administered 2017-11-02: 100 mL via INTRAVENOUS

## 2017-11-02 MED ORDER — LEVOFLOXACIN IN D5W 750 MG/150ML IV SOLN: 750.0000 mg | INTRAVENOUS | Status: DC

## 2017-11-02 NOTE — Progress Notes (Signed)
Pharmacy Antibiotic Note  [name] is a 67 y.o. female admitted on 11/02/2017 with sepsis.  Pharmacy has been consulted for vancomycin and Zosyn dosing.  LLQ pain and vomiting x4 days with fever x1 day. Fever of 102.3 at [facility], tachy and BP up. Vancomycin 1g and Zosyn 3.375g ordered by EDP  Plan: Vancomycin 740m IV q12h Zosyn 3.375g IV q8h EI Follow c/s, clinical progression, renal function, admission plans  Height: 5' 7"  (170.2 cm) Weight: 208 lb (94.3 kg) IBW/kg (Calculated) : 61.6  Temp (24hrs), Avg:102.3 F (39.1 C), Min:102.3 F (39.1 C), Max:102.3 F (39.1 C)  No results for input(s): WBC, CREATININE, LATICACIDVEN, VANCOTROUGH, VANCOPEAK, VANCORANDOM, GENTTROUGH, GENTPEAK, GENTRANDOM, TOBRATROUGH, TOBRAPEAK, TOBRARND, AMIKACINPEAK, AMIKACINTROU, AMIKACIN in the last 168 hours.  CrCl cannot be calculated (Patient's most recent lab result is older than the maximum 21 days allowed.).    Allergies  Allergen Reactions  . Adhesive [Tape] Other (See Comments)    Burn Skin  . Codeine Other (See Comments)    paranoid  . Zocor [Simvastatin - High Dose] Other (See Comments)    Muscle spasms  . Penicillins Rash    Has patient had a PCN reaction causing immediate rash, facial/tongue/throat swelling, SOB or lightheadedness with hypotension: Yes Has patient had a PCN reaction causing severe rash involving mucus membranes or skin necrosis: No Has patient had a PCN reaction that required hospitalization does not remember.  Has patient had a PCN reaction occurring within the last 10 years: No If all of the above answers are "NO", then may proceed with Cephalosporin use.     Antimicrobials this admission: Vancomycin 4/11 >>  Zosyn 4/11 >>   Dose adjustments this admission: n/a  Microbiology results: 4/11 BCx:  4/11 UCx:     Thank you for allowing pharmacy to be a part of this patient's care.  Damira Kem D. Kenzly Rogoff, PharmD, BCPS Clinical Pharmacist Clinical Phone for  11/02/2017 until 3:30pm: [phone] after 3:30pm, please call main pharmacy at [phone] 11/02/2017 1:02 PM

## 2017-11-02 NOTE — H&P (Signed)
History and Physical    [name] RZN:[id number] DOB: 1950-12-04 DOA: 11/02/2017  Referring MD/NP/PA: Dr Johnney Killian  PCP: Carollee Herter, Alferd Apa, DO   Outpatient Specialists: Dr Burney Gauze  Patient coming from: [facility]  Chief Complaint: Shortness of Breath  HPI: [name] is a 67 y.o. female with medical history significant of stomach ca s/p chemo/xrt in 2009, DM, HTN, TIA presenting to ED with malaise and abdominal discomfort.  Found to have fever, tachycardia, elevated lactate.  Pneumonia on CT.  Started on broad spectrum abx.  S/p 3 L NS bolus. Patient has no sick contacts. Denied any cold symptoms prior to onset. She's had nonproductive cough for 2 days prior to now. She has some white sputum at the moment. She reported his chest wall pain at 5 out of 10 with the symptoms. She has some shortness of breath which has getting better now    ED Course: patient was seen and evaluated in the ER. Lactic acid was found to be 2.86, ABG is within normal potassium of 5.1 and 1.30 which is her baseline, white count is 14.7with normal differentials, Temperature was 102.3 with a rate of 108 and blood pressure 150 461. CT chest shows left upper lobe bronchopneumonia  Review of Systems: As per HPI otherwise 10 point review of systems negative.    Past Medical History:  Diagnosis Date  . Asthma   . Degenerative joint disease   . Diabetes mellitus   . Dyspnea   . GERD (gastroesophageal reflux disease)   . Hyperlipidemia    no longer on medication for this  . Hypertension    patient denies  . Neuropathy    bilateral hands and feet  . Sickle cell trait ([place])   . Sleep apnea   . stomach ca dx'd 2010   chemo/xrt comp 05/2009  . TIA (transient ischemic attack) 07/2015    Past Surgical History:  Procedure Laterality Date  . APPENDECTOMY    . CESAREAN SECTION    . COLON SURGERY     colonscopy  . COLONOSCOPY WITH PROPOFOL N/A 06/01/2016   Procedure: COLONOSCOPY WITH PROPOFOL;  Surgeon:  Wilford Corner, MD;  Location: [facility] ENDOSCOPY;  Service: Endoscopy;  Laterality: N/A;  . ESOPHAGOGASTRODUODENOSCOPY N/A 10/15/2012   Procedure: ESOPHAGOGASTRODUODENOSCOPY (EGD);  Surgeon: Lear Ng, MD;  Location: [facility] ENDOSCOPY;  Service: Endoscopy;  Laterality: N/A;  . ESOPHAGOGASTRODUODENOSCOPY N/A 01/15/2016   Procedure: ESOPHAGOGASTRODUODENOSCOPY (EGD);  Surgeon: Ronald Lobo, MD;  Location: [facility] ENDOSCOPY;  Service: Endoscopy;  Laterality: N/A;  . HERNIA REPAIR    . SHOULDER SURGERY     Left  . TOTAL HIP ARTHROPLASTY     Right  . TOTAL KNEE ARTHROPLASTY     Left     reports that she quit smoking about 29 years ago. Her smoking use included cigarettes. She started smoking about 44 years ago. She has a 15.00 pack-year smoking history. She has never used smokeless tobacco. She reports that she does not drink alcohol or use drugs.  Allergies  Allergen Reactions  . Adhesive [Tape] Other (See Comments)    Burn Skin  . Codeine Other (See Comments)    paranoid  . Zocor [Simvastatin - High Dose] Other (See Comments)    Muscle spasms  . Penicillins Rash    Has patient had a PCN reaction causing immediate rash, facial/tongue/throat swelling, SOB or lightheadedness with hypotension: Yes Has patient had a PCN reaction causing severe rash involving mucus membranes or skin necrosis: No Has  patient had a PCN reaction that required hospitalization does not remember.  Has patient had a PCN reaction occurring within the last 10 years: No If all of the above answers are "NO", then may proceed with Cephalosporin use.     Family History  Problem Relation Age of Onset  . Diabetes Brother   . Alzheimer's disease Father 85  . Cancer Mother 3  . Diabetes Maternal Grandmother   . Cancer Maternal Grandfather        lung  . Suicidality Neg Hx   . Depression Neg Hx   . Dementia Neg Hx   . Anxiety disorder Neg Hx     Prior to Admission medications   Medication Sig Start Date End Date  Taking? Authorizing Provider  aspirin 81 MG tablet Take 81 mg by mouth at bedtime.   Yes [provider]  cyclobenzaprine (FLEXERIL) 5 MG tablet Take 1 tablet (5 mg total) by mouth 3 (three) times daily as needed for muscle spasms. 03/07/17  Yes Biagio Borg, MD  DULoxetine (CYMBALTA) 60 MG capsule Take 1 capsule (60 mg total) by mouth 2 (two) times daily. 10/16/17  Yes Lowne Chase, Yvonne R, DO  EMBEDA 50-2 MG CPCR Take 1 capsule by mouth daily. 12/26/16  Yes [provider]  gabapentin (NEURONTIN) 400 MG capsule Take 2 capsules (800 mg total) by mouth 3 (three) times daily. 08/16/17  Yes Roma Schanz R, DO  glimepiride (AMARYL) 4 MG tablet Take 1 tablet (4 mg total) by mouth 2 (two) times daily. 10/16/17  Yes Roma Schanz R, DO  ipratropium-albuterol (DUONEB) 0.5-2.5 (3) MG/3ML SOLN Take 3 mLs by nebulization every 6 (six) hours as needed. 05/26/17  Yes Roma Schanz R, DO  metFORMIN (GLUCOPHAGE) 500 MG tablet Take 1 tablet (500 mg total) by mouth 2 (two) times daily with a meal. 02/22/17  Yes Philemon Kingdom, MD  Multiple Vitamin (MULTIVITAMIN WITH MINERALS) TABS tablet Take 1 tablet by mouth daily.   Yes [provider]  sucralfate (CARAFATE) 1 GM/10ML suspension Take 10 mLs (1 g total) by mouth 4 (four) times daily -  with meals and at bedtime. 08/21/17  Yes Ennever, Rudell Cobb, MD  traMADol (ULTRAM) 50 MG tablet Take 1 tablet (50 mg total) by mouth 4 (four) times daily. Patient taking differently: Take 50 mg by mouth 3 (three) times daily as needed for moderate pain.  05/26/17  Yes Roma Schanz R, DO  azithromycin (ZITHROMAX) 250 MG tablet Take 250 mg by mouth daily. 09/29/17   [provider]  Blood Glucose Calibration (OT ULTRA/FASTTK CNTRL SOLN) SOLN  02/17/16   [provider]  Blood Glucose Monitoring Suppl (ONE TOUCH ULTRA 2) w/Device KIT  02/17/16   [provider]  Blood Pressure Monitoring (B-D ASSURE BPM/MANUAL ARM  CUFF) MISC Check blood pressure daily. 02/18/16   Ann Held, DO  clindamycin (CLEOCIN) 300 MG capsule Take 1 capsule (300 mg total) 3 (three) times daily by mouth. 06/09/17   Carollee Herter, Alferd Apa, DO  doxycycline (VIBRAMYCIN) 100 MG capsule Take 100 mg by mouth 2 (two) times daily. 10/06/17   [provider]  omeprazole (PRILOSEC) 20 MG capsule Take 1 capsule (20 mg total) by mouth daily. Patient not taking: Reported on 11/02/2017 02/21/17   Volanda Napoleon, MD  ONE [facility] ULTRA TEST test strip  02/17/16   [provider]    Physical Exam: Vitals:   11/02/17 1830 11/02/17 1831 11/02/17 1832  11/02/17 1833  BP:  (!) 154/61    Pulse:    (!) 108  Resp:  18 16 (!) 26  Temp: 99.1 F (37.3 C)     TempSrc: Oral     SpO2:    98%  Weight:      Height:          Constitutional: NAD, calm, comfortable Vitals:   11/02/17 1830 11/02/17 1831 11/02/17 1832 11/02/17 1833  BP:  (!) 154/61    Pulse:    (!) 108  Resp:  18 16 (!) 26  Temp: 99.1 F (37.3 C)     TempSrc: Oral     SpO2:    98%  Weight:      Height:       Eyes: PERRL, lids and conjunctivae normal ENMT: Mucous membranes are moist. Posterior pharynx clear of any exudate or lesions.Normal dentition.  Neck: normal, supple, no masses, no thyromegaly Respiratory: clear to auscultation bilaterally, no wheezing, no crackles. Normal respiratory effort. No accessory muscle use.  Cardiovascular: Regular rate and rhythm, no murmurs / rubs / gallops. No extremity edema. 2+ pedal pulses. No carotid bruits.  Abdomen: no tenderness, no masses palpated. No hepatosplenomegaly. Bowel sounds positive.  Musculoskeletal: no clubbing / cyanosis. No joint deformity upper and lower extremities. Good ROM, no contractures. Normal muscle tone.  Skin: no rashes, lesions, ulcers. No induration Neurologic: CN 2-12 grossly intact. Sensation intact, DTR normal. Strength 5/5 in all 4.  Psychiatric: Normal judgment and insight. Alert and  oriented x 3. Normal mood.   Labs on Admission: I have personally reviewed following labs and imaging studies  CBC: Recent Labs  Lab 11/02/17 1115  WBC 14.7*  NEUTROABS 12.3*  HGB 12.5  HCT 37.0  MCV 88.9  PLT 093   Basic Metabolic Panel: Recent Labs  Lab 11/02/17 1115  NA 140  K 5.1  CL 108  CO2 21*  GLUCOSE 87  BUN 24*  CREATININE 1.30*  CALCIUM 8.8*   GFR: Estimated Creatinine Clearance: 50.9 mL/min (A) (by C-G formula based on SCr of 1.3 mg/dL (H)). Liver Function Tests: Recent Labs  Lab 11/02/17 1115  AST 60*  ALT 47  ALKPHOS 109  BILITOT 0.7  PROT 6.6  ALBUMIN 2.9*   Recent Labs  Lab 11/02/17 1115  LIPASE 16   No results for input(s): AMMONIA in the last 168 hours. Coagulation Profile: No results for input(s): INR, PROTIME in the last 168 hours. Cardiac Enzymes: Recent Labs  Lab 11/02/17 1115  TROPONINI <0.03   BNP (last 3 results) No results for input(s): PROBNP in the last 8760 hours. HbA1C: No results for input(s): HGBA1C in the last 72 hours. CBG: No results for input(s): GLUCAP in the last 168 hours. Lipid Profile: No results for input(s): CHOL, HDL, LDLCALC, TRIG, CHOLHDL, LDLDIRECT in the last 72 hours. Thyroid Function Tests: No results for input(s): TSH, T4TOTAL, FREET4, T3FREE, THYROIDAB in the last 72 hours. Anemia Panel: No results for input(s): VITAMINB12, FOLATE, FERRITIN, TIBC, IRON, RETICCTPCT in the last 72 hours. Urine analysis:    Component Value Date/Time   COLORURINE YELLOW 11/02/2017 [place] 11/02/2017 1222   LABSPEC 1.010 11/02/2017 1222   LABSPEC 1.015 04/06/2015 0939   PHURINE 6.0 11/02/2017 1222   GLUCOSEU NEGATIVE 11/02/2017 1222   HGBUR NEGATIVE 11/02/2017 1222   BILIRUBINUR NEGATIVE 11/02/2017 1222   BILIRUBINUR neg 06/21/2016 1726   KETONESUR NEGATIVE 11/02/2017 1222   PROTEINUR NEGATIVE 11/02/2017 1222   UROBILINOGEN 0.2  06/21/2016 1726   UROBILINOGEN 0.2 04/06/2015 0939   NITRITE  NEGATIVE 11/02/2017 1222   LEUKOCYTESUR NEGATIVE 11/02/2017 1222   Sepsis Labs: @LABRCNTIP (procalcitonin:4,lacticidven:4) ) Recent Results (from the past 240 hour(s))  Blood Culture (routine x 2)     Status: None (Preliminary result)   Collection Time: 11/02/17 11:15 AM  Result Value Ref Range Status   Specimen Description   Final    BLOOD LEFT ANTECUBITAL Performed at [facility], [place] [address] [place], [place] [address]    Special Requests   Final    BLOOD Blood Culture adequate volume Performed at [facility], [address] [place], [place] [address]    Culture PENDING  Incomplete   Report Status PENDING  Incomplete  Blood Culture (routine x 2)     Status: None (Preliminary result)   Collection Time: 11/02/17 11:55 AM  Result Value Ref Range Status   Specimen Description   Final    BLOOD RIGHT ANTECUBITAL Performed at [facility], [place] [address], [place], [place] [address]    Special Requests   Final    BLOOD Blood Culture results may not be optimal due to an inadequate volume of blood received in culture bottles Performed at [facility], [address] [place], [place] [address]    Culture PENDING  Incomplete   Report Status PENDING  Incomplete     Radiological Exams on Admission: Ct Chest W Contrast  Result Date: 11/02/2017 CLINICAL DATA:  Shortness of breath and abdominal pain. History of gastric cancer. EXAM: CT CHEST, ABDOMEN, AND PELVIS WITH CONTRAST TECHNIQUE: Multidetector CT imaging of the chest, abdomen and pelvis was performed following the standard protocol during bolus administration of intravenous contrast. CONTRAST:  124m ISOVUE-300 IOPAMIDOL (ISOVUE-300) INJECTION 61% COMPARISON:  CT abdomen pelvis dated Dec 17, 2016. CT chest dated Dec 21, 2011. FINDINGS: CT CHEST FINDINGS Cardiovascular: Normal heart size. No pericardial effusion. Normal caliber thoracic aorta. Coronary, aortic arch, and branch vessel  atherosclerotic vascular disease. Right chest wall port catheter with the tip in the distal SVC. Mediastinum/Nodes: No enlarged mediastinal, hilar, or axillary lymph nodes. There is a 1.3 cm hypodense nodule within the right thyroid lobe with small surrounding punctate calcifications. The trachea and esophagus demonstrate no significant abnormalities. Small hiatal hernia, unchanged. Lungs/Pleura: Patchy consolidation within the left upper lobe. No pleural effusion or pneumothorax. No suspicious pulmonary nodule. Musculoskeletal: No chest wall abnormality. No acute or significant osseous findings. CT ABDOMEN PELVIS FINDINGS Hepatobiliary: Diffuse hepatic steatosis. Mild gallbladder distention without wall thickening or gallstones. No biliary dilatation. Pancreas: Atrophic. No pancreatic ductal dilatation or surrounding inflammatory changes. Spleen: Normal in size without focal abnormality. Adrenals/Urinary Tract: Adrenal glands are unremarkable. Kidneys are normal, without renal calculi, focal lesion, or hydronephrosis. Bladder is unremarkable. Stomach/Bowel: Postsurgical changes related to prior distal antrectomy and gastrojejunostomy with patent anastomosis. No bowel wall thickening, distention, or surrounding inflammatory changes. Mild sigmoid diverticulosis. Moderate colonic stool burden. Prior appendectomy. Vascular/Lymphatic: Aortic atherosclerosis. No enlarged abdominal or pelvic lymph nodes. Reproductive: Status post hysterectomy. No adnexal masses. Other: No free fluid or pneumoperitoneum. Postsurgical changes along the anterior abdominal wall. Musculoskeletal: Prior right total hip arthroplasty. No acute or significant osseous findings. IMPRESSION: 1. Left upper lobe bronchopneumonia. 2.  No acute intra-abdominal process. 3. Diffuse hepatic steatosis. 4.  Prominent stool throughout the colon favors constipation. 5.  Aortic atherosclerosis (ICD10-I70.0). Electronically Signed   By: WTitus DubinM.D.   On:  11/02/2017 13:48  Ct Abdomen Pelvis W Contrast  Result Date: 11/02/2017 CLINICAL DATA:  Shortness of breath and abdominal pain. History of gastric cancer. EXAM: CT CHEST, ABDOMEN, AND PELVIS WITH CONTRAST TECHNIQUE: Multidetector CT imaging of the chest, abdomen and pelvis was performed following the standard protocol during bolus administration of intravenous contrast. CONTRAST:  162m ISOVUE-300 IOPAMIDOL (ISOVUE-300) INJECTION 61% COMPARISON:  CT abdomen pelvis dated Dec 17, 2016. CT chest dated Dec 21, 2011. FINDINGS: CT CHEST FINDINGS Cardiovascular: Normal heart size. No pericardial effusion. Normal caliber thoracic aorta. Coronary, aortic arch, and branch vessel atherosclerotic vascular disease. Right chest wall port catheter with the tip in the distal SVC. Mediastinum/Nodes: No enlarged mediastinal, hilar, or axillary lymph nodes. There is a 1.3 cm hypodense nodule within the right thyroid lobe with small surrounding punctate calcifications. The trachea and esophagus demonstrate no significant abnormalities. Small hiatal hernia, unchanged. Lungs/Pleura: Patchy consolidation within the left upper lobe. No pleural effusion or pneumothorax. No suspicious pulmonary nodule. Musculoskeletal: No chest wall abnormality. No acute or significant osseous findings. CT ABDOMEN PELVIS FINDINGS Hepatobiliary: Diffuse hepatic steatosis. Mild gallbladder distention without wall thickening or gallstones. No biliary dilatation. Pancreas: Atrophic. No pancreatic ductal dilatation or surrounding inflammatory changes. Spleen: Normal in size without focal abnormality. Adrenals/Urinary Tract: Adrenal glands are unremarkable. Kidneys are normal, without renal calculi, focal lesion, or hydronephrosis. Bladder is unremarkable. Stomach/Bowel: Postsurgical changes related to prior distal antrectomy and gastrojejunostomy with patent anastomosis. No bowel wall thickening, distention, or surrounding inflammatory changes. Mild sigmoid  diverticulosis. Moderate colonic stool burden. Prior appendectomy. Vascular/Lymphatic: Aortic atherosclerosis. No enlarged abdominal or pelvic lymph nodes. Reproductive: Status post hysterectomy. No adnexal masses. Other: No free fluid or pneumoperitoneum. Postsurgical changes along the anterior abdominal wall. Musculoskeletal: Prior right total hip arthroplasty. No acute or significant osseous findings. IMPRESSION: 1. Left upper lobe bronchopneumonia. 2.  No acute intra-abdominal process. 3. Diffuse hepatic steatosis. 4.  Prominent stool throughout the colon favors constipation. 5.  Aortic atherosclerosis (ICD10-I70.0). Electronically Signed   By: WTitus DubinM.D.   On: 11/02/2017 13:48    EKG: Independently reviewed. Shows sinus tachycardia ith rate of 120. No significant ST changes  Assessment/Plan Principal Problem:   Sepsis ([place] Active Problems:   CHRONIC RESPIRATORY FAILURE   GERD (gastroesophageal reflux disease)   Diabetes mellitus with circulatory complication, without long-term current use of insulin (HCC)   CKD (chronic kidney disease), stage III (HCC)   AKI (acute kidney injury) ([place]   Pneumonia   Leucocytosis    #1 sepsis: Secondary to pneumonia. Patient is relatively immunocompromised with a history of cancer although she has completed chemotherapy and radiation years ago. She'll be initiated on vancomycin and cefepime. Await results of culture and sensitivities. Oxygen as needed.  #2 pneumonia: Most likely community-acquired pneumonia but with her ICU admission as well as sepsis in the setting of relative immunocompromise status, patient will be treated for healthcare associated pneumonia.  #3 chronic kidney disease stage III: Appears to be at baseline. Continue monitoring  #4 diabetes: Blood sugar is controlled continue her long-acting insulin with sliding scale insulin. Hold metformin  #5 GERD: Continue PPIs  #6 history of malignancy: Patient is following up with  oncologist as needed. Last visit was last year.   DVT prophylaxis: Lovenox  Code Status: Full  Family Communication: No one on bedside  Disposition Plan: Home  Consults called: None Admission status: Inpatient  Severity of Illness: The appropriate patient status for this patient is INPATIENT. Inpatient status is judged to be reasonable and  necessary in order to provide the required intensity of service to ensure the patient's safety. The patient's presenting symptoms, physical exam findings, and initial radiographic and laboratory data in the context of their chronic comorbidities is felt to place them at high risk for further clinical deterioration. Furthermore, it is not anticipated that the patient will be medically stable for discharge from the hospital within 2 midnights of admission. The following factors support the patient status of inpatient.   " The patient's presenting symptoms include fever and  chills. " The worrisome physical exam findings include temperature 103 with shortness of breath. " The initial radiographic and laboratory data are worrisome because of chest x-ray showing pneumonia. " The chronic co-morbidities include diabetes was previous history of cancer.   * I certify that at the point of admission it is my clinical judgment that the patient will require inpatient hospital care spanning beyond 2 midnights from the point of admission due to high intensity of service, high risk for further deterioration and high frequency of surveillance required.Barbette Merino MD [facility] Pager [phone]  If 7PM-7AM, please contact night-coverage [email or web address] Password [facility]  11/02/2017, 7:15 PM

## 2017-11-02 NOTE — ED Provider Notes (Signed)
[facility] EMERGENCY DEPARTMENT Provider Note   CSN: [id number] Arrival date & time: 11/02/17  1033     History   Chief Complaint Chief Complaint  Patient presents with  . Abdominal Pain  . Fever    HPI [name] is a 67 y.o. female.  HPI She reports she has been sick the past 4 days.  Patient reports that she has been having abdominal pain.  It is difficult for her to localize.  Pain seems to be lower.  Patient was somewhat unreliable on her report of vomiting and diarrhea.  She reported diarrhea to me with no vomiting but had reported vomiting to nursing staff without diarrhea.  She reports that his heart for her to remember which she is really had.  She reports she feels generally weak and unwell.  She reports her fever started yesterday.  Denies she is been having any chest pain, shortness of breath or cough.  Denies pain or burning with urination.  Denies headache, nasal congestion, sore throat.  She went for a scheduled follow-up appointment at Dr. Antonieta Pert office today.  She presented today with fever and tachycardia in the office and they referred her to the emergency department.  She had history of stomach cancer but had completed treatment and was considered cured.  She has had no recent chemotherapy or radiation therapy. Past Medical History:  Diagnosis Date  . Asthma   . Degenerative joint disease   . Diabetes mellitus   . Dyspnea   . GERD (gastroesophageal reflux disease)   . Hyperlipidemia    no longer on medication for this  . Hypertension    patient denies  . Neuropathy    bilateral hands and feet  . Sickle cell trait ([place])   . Sleep apnea   . stomach ca dx'd 2010   chemo/xrt comp 05/2009  . TIA (transient ischemic attack) 07/2015    Patient Active Problem List   Diagnosis Date Noted  . Moderate persistent asthma with acute exacerbation 05/26/2017  . Influenza A 05/26/2017  . Acute pain of right shoulder 03/07/2017  . Acute bilateral low  back pain without sciatica 03/07/2017  . Chronic pain 03/07/2017  . Fatty liver 03/24/2016  . Weakness 03/23/2016  . Unexplained weight loss 03/23/2016  . Melena 03/23/2016  . Hypotension 02/03/2016  . Syncope 02/03/2016  . Hypomagnesemia 02/03/2016  . AKI (acute kidney injury) ([place]) 02/03/2016  . Gastrointestinal dysmotility 01/20/2016  . CKD (chronic kidney disease), stage III ([place]) 01/20/2016  . Abscess of right thigh 01/20/2016  . Chronic pain syndrome 01/15/2016  . Diarrhea 01/15/2016  . Generalized abdominal pain 01/14/2016  . Diabetes mellitus with circulatory complication, without long-term current use of insulin ([place]) 12/30/2015  . Morbid obesity due to excess calories ([place]) 05/05/2015  . Diabetic polyneuropathy associated with type 2 diabetes mellitus ([place]) 01/02/2015  . Acute bacterial sinusitis 01/02/2015  . Onychomycosis 11/04/2014  . Pain in lower limb 11/04/2014  . Multinodular goiter (nontoxic) 10/16/2014  . CVA (cerebral infarction) 08/11/2014  . Iron deficiency anemia 05/20/2014  . Major depressive disorder, single episode, moderate ([place]) 05/20/2014  . Dizziness of unknown cause 04/28/2014  . MDD (major depressive disorder), recurrent episode, moderate ([place]) 01/28/2014  . GERD (gastroesophageal reflux disease) 01/02/2014  . Nausea 10/15/2012  . Stomach cancer ([place]) 12/10/2008  . CHRONIC RESPIRATORY FAILURE 01/03/2008  . Hyperlipidemia 06/27/2007  . OBESITY, MORBID 06/27/2007  . SLEEP APNEA, OBSTRUCTIVE 06/27/2007  . Essential hypertension 06/27/2007  . Asthma 06/27/2007  .  OSTEOARTHRITIS  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 06/27/2007    Past Surgical History:  Procedure Laterality Date  . APPENDECTOMY    . CESAREAN SECTION    . COLON SURGERY     colonscopy  . COLONOSCOPY WITH PROPOFOL N/A 06/01/2016   Procedure: COLONOSCOPY WITH PROPOFOL;  Surgeon: Wilford Corner, MD;  Location: [facility] ENDOSCOPY;  Service: Endoscopy;  Laterality: N/A;  .  ESOPHAGOGASTRODUODENOSCOPY N/A 10/15/2012   Procedure: ESOPHAGOGASTRODUODENOSCOPY (EGD);  Surgeon: Lear Ng, MD;  Location: [facility] ENDOSCOPY;  Service: Endoscopy;  Laterality: N/A;  . ESOPHAGOGASTRODUODENOSCOPY N/A 01/15/2016   Procedure: ESOPHAGOGASTRODUODENOSCOPY (EGD);  Surgeon: Ronald Lobo, MD;  Location: [facility] ENDOSCOPY;  Service: Endoscopy;  Laterality: N/A;  . HERNIA REPAIR    . SHOULDER SURGERY     Left  . TOTAL HIP ARTHROPLASTY     Right  . TOTAL KNEE ARTHROPLASTY     Left     OB History    Gravida  4   Para  4   Term      Preterm      AB      Living  4     SAB      TAB      Ectopic      Multiple      Live Births               Home Medications    Prior to Admission medications   Medication Sig Start Date End Date Taking? Authorizing Provider  aspirin 81 MG tablet Take 81 mg by mouth at bedtime.    [provider]  Blood Glucose Calibration (OT ULTRA/FASTTK CNTRL SOLN) SOLN  02/17/16   [provider]  Blood Glucose Monitoring Suppl (ONE TOUCH ULTRA 2) w/Device KIT  02/17/16   [provider]  Blood Pressure Monitoring (B-D ASSURE BPM/MANUAL ARM CUFF) MISC Check blood pressure daily. 02/18/16   Ann Held, DO  clindamycin (CLEOCIN) 300 MG capsule Take 1 capsule (300 mg total) 3 (three) times daily by mouth. 06/09/17   Carollee Herter, Alferd Apa, DO  Continuous Blood Gluc Receiver (FREESTYLE LIBRE READER) DEVI 1 Device by Does not apply route 3 (three) times daily. 05/26/17   Ann Held, DO  Continuous Blood Gluc Sensor ([place]) MISC 1 sensor every 10 days 05/26/17   Carollee Herter, Alferd Apa, DO  cyclobenzaprine (FLEXERIL) 5 MG tablet Take 1 tablet (5 mg total) by mouth 3 (three) times daily as needed for muscle spasms. 03/07/17   Biagio Borg, MD  DULoxetine (CYMBALTA) 60 MG capsule Take 1 capsule (60 mg total) by mouth 2 (two) times daily. 10/16/17   Lowne Chase, Yvonne R, DO  EMBEDA 50-2  MG CPCR Take 1 capsule by mouth daily. 12/26/16   [provider]  gabapentin (NEURONTIN) 400 MG capsule Take 2 capsules (800 mg total) by mouth 3 (three) times daily. 08/16/17   Ann Held, DO  glimepiride (AMARYL) 4 MG tablet Take 1 tablet (4 mg total) by mouth 2 (two) times daily. 10/16/17   Roma Schanz R, DO  ipratropium-albuterol (DUONEB) 0.5-2.5 (3) MG/3ML SOLN Take 3 mLs by nebulization every 6 (six) hours as needed. 05/26/17   Roma Schanz R, DO  lidocaine (LIDODERM) 5 % Place 1 patch onto the skin daily. Remove & Discard patch within 12 hours or as directed by MD 03/07/17   Biagio Borg, MD  metFORMIN (GLUCOPHAGE) 500 MG tablet  Take 1 tablet (500 mg total) by mouth 2 (two) times daily with a meal. 02/22/17   Philemon Kingdom, MD  Multiple Vitamin (MULTIVITAMIN WITH MINERALS) TABS tablet Take 1 tablet by mouth daily.    [provider]  omeprazole (PRILOSEC) 20 MG capsule Take 1 capsule (20 mg total) by mouth daily. 02/21/17   Volanda Napoleon, MD  ondansetron (ZOFRAN ODT) 4 MG disintegrating tablet Take 1 tablet (4 mg total) by mouth every 8 (eight) hours as needed for nausea or vomiting. 02/21/17   Volanda Napoleon, MD  ONE TOUCH ULTRA TEST test strip  02/17/16   [provider]  sucralfate (CARAFATE) 1 GM/10ML suspension Take 10 mLs (1 g total) by mouth 4 (four) times daily -  with meals and at bedtime. 08/21/17   Volanda Napoleon, MD  traMADol (ULTRAM) 50 MG tablet Take 1 tablet (50 mg total) by mouth 4 (four) times daily. 05/26/17   Ann Held, DO    Family History Family History  Problem Relation Age of Onset  . Diabetes Brother   . Alzheimer's disease Father 38  . Cancer Mother 63  . Diabetes Maternal Grandmother   . Cancer Maternal Grandfather        lung  . Suicidality Neg Hx   . Depression Neg Hx   . Dementia Neg Hx   . Anxiety disorder Neg Hx     Social History Social History   Tobacco Use  . Smoking status:  Former Smoker    Packs/day: 1.00    Years: 15.00    Pack years: 15.00    Types: Cigarettes    Start date: 09/27/1973    Last attempt to quit: 10/15/1988    Years since quitting: 29.0  . Smokeless tobacco: Never Used  . Tobacco comment: quit smoking 14 years ago  Substance Use Topics  . Alcohol use: No    Alcohol/week: 0.0 oz  . Drug use: No     Allergies   Adhesive [tape]; Codeine; Zocor [simvastatin - high dose]; and Penicillins Reports that as a kid penicillin made her throw up.  She denies any rash or difficulty breathing.  Review of Systems Review of Systems 10 Systems reviewed and are negative for acute change except as noted in the HPI.   Physical Exam Updated Vital Signs BP (!) 143/77   Pulse (!) 114   Temp (!) 101.8 F (38.8 C) (Oral)   Resp 17   Ht _0  (1.702 m)   Wt 94.3 kg (208 lb)   SpO2 95%   BMI 32.58 kg/m   Physical Exam  Constitutional:  Patient is alert.  She does not have respiratory distress.  She appears slightly pale.  Patient has ill appearance.  She appears uncomfortable.  HENT:  Head: Normocephalic and atraumatic.  Nose: Nose normal.  Mouth/Throat: Oropharynx is clear and moist.  Eyes: Pupils are equal, round, and reactive to light. EOM are normal.  No scleral icterus.  Neck: Neck supple.  Cardiovascular:  Tachycardia.  No gross rub murmur gallop.  Pulmonary/Chest:  Lungs are clear.  No wheeze rhonchi or rale.  Mild tachypnea.  Abdominal:  Abdomen is tender.  This is diffuse in the lower abdomen.  Patient has difficulty localizing pain.  Lower abdominal obesity.  Difficult to discern whether she has some firm lower abdominal mass.  Beneath pannus fold on both sides there is small amount of candidal rash.  Not extensive at this time.  Visual inspection of the  external genitalia and the groin folds does not show any rash or soft tissue swellings.  Visual inspection of the buttocks and perianal region without rash or soft tissue anomaly.    Musculoskeletal:  No peripheral edema.  Calves soft and nontender.  Patient does not have rashes, wounds or lesions to the feet or legs.  Neurological: She is alert. No cranial nerve deficit. She exhibits normal muscle tone. Coordination normal.  Patient is alert and appropriately interactive.  She does have ill appearance but she follows commands appropriately her speech and responses are situationally appropriate.  Skin: Skin is warm and dry. There is pallor.  Psychiatric: She has a normal mood and affect.     ED Treatments / Results  Labs (all labs ordered are listed, but only abnormal results are displayed) Labs Reviewed  COMPREHENSIVE METABOLIC PANEL - Abnormal; Notable for the following components:      Result Value   CO2 21 (*)    BUN 24 (*)    Creatinine, Ser 1.30 (*)    Calcium 8.8 (*)    Albumin 2.9 (*)    AST 60 (*)    GFR calc non Af Amer 42 (*)    GFR calc Af Amer 49 (*)    All other components within normal limits  CBC WITH DIFFERENTIAL/PLATELET - Abnormal; Notable for the following components:   WBC 14.7 (*)    Neutro Abs 12.3 (*)    Monocytes Absolute 1.2 (*)    All other components within normal limits  I-STAT CG4 LACTIC ACID, ED - Abnormal; Notable for the following components:   Lactic Acid, Venous 3.00 (*)    All other components within normal limits  I-STAT CG4 LACTIC ACID, ED - Abnormal; Notable for the following components:   Lactic Acid, Venous 2.86 (*)    All other components within normal limits  CULTURE, BLOOD (ROUTINE X 2)  CULTURE, BLOOD (ROUTINE X 2)  URINE CULTURE  URINALYSIS, ROUTINE W REFLEX MICROSCOPIC  LIPASE, BLOOD  BRAIN NATRIURETIC PEPTIDE  TROPONIN I  INFLUENZA PANEL BY PCR (TYPE A & B)    EKG EKG Interpretation  Date/Time:  Thursday November 02 2017 11:04:57 EDT Ventricular Rate:  120 PR Interval:    QRS Duration: 76 QT Interval:  276 QTC Calculation: 390 R Axis:   -34 Text Interpretation:  Sinus tachycardia Left axis  deviation no change from previous except rate Confirmed by Charlesetta Shanks [phone]) on 11/02/2017 12:15:47 PM   Radiology Ct Chest W Contrast  Result Date: 11/02/2017 CLINICAL DATA:  Shortness of breath and abdominal pain. History of gastric cancer. EXAM: CT CHEST, ABDOMEN, AND PELVIS WITH CONTRAST TECHNIQUE: Multidetector CT imaging of the chest, abdomen and pelvis was performed following the standard protocol during bolus administration of intravenous contrast. CONTRAST:  146m ISOVUE-300 IOPAMIDOL (ISOVUE-300) INJECTION 61% COMPARISON:  CT abdomen pelvis dated Dec 17, 2016. CT chest dated Dec 21, 2011. FINDINGS: CT CHEST FINDINGS Cardiovascular: Normal heart size. No pericardial effusion. Normal caliber thoracic aorta. Coronary, aortic arch, and branch vessel atherosclerotic vascular disease. Right chest wall port catheter with the tip in the distal SVC. Mediastinum/Nodes: No enlarged mediastinal, hilar, or axillary lymph nodes. There is a 1.3 cm hypodense nodule within the right thyroid lobe with small surrounding punctate calcifications. The trachea and esophagus demonstrate no significant abnormalities. Small hiatal hernia, unchanged. Lungs/Pleura: Patchy consolidation within the left upper lobe. No pleural effusion or pneumothorax. No suspicious pulmonary nodule. Musculoskeletal: No chest wall abnormality. No acute or significant osseous  findings. CT ABDOMEN PELVIS FINDINGS Hepatobiliary: Diffuse hepatic steatosis. Mild gallbladder distention without wall thickening or gallstones. No biliary dilatation. Pancreas: Atrophic. No pancreatic ductal dilatation or surrounding inflammatory changes. Spleen: Normal in size without focal abnormality. Adrenals/Urinary Tract: Adrenal glands are unremarkable. Kidneys are normal, without renal calculi, focal lesion, or hydronephrosis. Bladder is unremarkable. Stomach/Bowel: Postsurgical changes related to prior distal antrectomy and gastrojejunostomy with patent  anastomosis. No bowel wall thickening, distention, or surrounding inflammatory changes. Mild sigmoid diverticulosis. Moderate colonic stool burden. Prior appendectomy. Vascular/Lymphatic: Aortic atherosclerosis. No enlarged abdominal or pelvic lymph nodes. Reproductive: Status post hysterectomy. No adnexal masses. Other: No free fluid or pneumoperitoneum. Postsurgical changes along the anterior abdominal wall. Musculoskeletal: Prior right total hip arthroplasty. No acute or significant osseous findings. IMPRESSION: 1. Left upper lobe bronchopneumonia. 2.  No acute intra-abdominal process. 3. Diffuse hepatic steatosis. 4.  Prominent stool throughout the colon favors constipation. 5.  Aortic atherosclerosis (ICD10-I70.0). Electronically Signed   By: Titus Dubin M.D.   On: 11/02/2017 13:48   Ct Abdomen Pelvis W Contrast  Result Date: 11/02/2017 CLINICAL DATA:  Shortness of breath and abdominal pain. History of gastric cancer. EXAM: CT CHEST, ABDOMEN, AND PELVIS WITH CONTRAST TECHNIQUE: Multidetector CT imaging of the chest, abdomen and pelvis was performed following the standard protocol during bolus administration of intravenous contrast. CONTRAST:  177m ISOVUE-300 IOPAMIDOL (ISOVUE-300) INJECTION 61% COMPARISON:  CT abdomen pelvis dated Dec 17, 2016. CT chest dated Dec 21, 2011. FINDINGS: CT CHEST FINDINGS Cardiovascular: Normal heart size. No pericardial effusion. Normal caliber thoracic aorta. Coronary, aortic arch, and branch vessel atherosclerotic vascular disease. Right chest wall port catheter with the tip in the distal SVC. Mediastinum/Nodes: No enlarged mediastinal, hilar, or axillary lymph nodes. There is a 1.3 cm hypodense nodule within the right thyroid lobe with small surrounding punctate calcifications. The trachea and esophagus demonstrate no significant abnormalities. Small hiatal hernia, unchanged. Lungs/Pleura: Patchy consolidation within the left upper lobe. No pleural effusion or  pneumothorax. No suspicious pulmonary nodule. Musculoskeletal: No chest wall abnormality. No acute or significant osseous findings. CT ABDOMEN PELVIS FINDINGS Hepatobiliary: Diffuse hepatic steatosis. Mild gallbladder distention without wall thickening or gallstones. No biliary dilatation. Pancreas: Atrophic. No pancreatic ductal dilatation or surrounding inflammatory changes. Spleen: Normal in size without focal abnormality. Adrenals/Urinary Tract: Adrenal glands are unremarkable. Kidneys are normal, without renal calculi, focal lesion, or hydronephrosis. Bladder is unremarkable. Stomach/Bowel: Postsurgical changes related to prior distal antrectomy and gastrojejunostomy with patent anastomosis. No bowel wall thickening, distention, or surrounding inflammatory changes. Mild sigmoid diverticulosis. Moderate colonic stool burden. Prior appendectomy. Vascular/Lymphatic: Aortic atherosclerosis. No enlarged abdominal or pelvic lymph nodes. Reproductive: Status post hysterectomy. No adnexal masses. Other: No free fluid or pneumoperitoneum. Postsurgical changes along the anterior abdominal wall. Musculoskeletal: Prior right total hip arthroplasty. No acute or significant osseous findings. IMPRESSION: 1. Left upper lobe bronchopneumonia. 2.  No acute intra-abdominal process. 3. Diffuse hepatic steatosis. 4.  Prominent stool throughout the colon favors constipation. 5.  Aortic atherosclerosis (ICD10-I70.0). Electronically Signed   By: WTitus DubinM.D.   On: 11/02/2017 13:48    Procedures Procedures (including critical care time) CRITICAL CARE Performed by: MSi Gaul  Total critical care time: 30 minutes  Critical care time was exclusive of separately billable procedures and treating other patients.  Critical care was necessary to treat or prevent imminent or life-threatening deterioration.  Critical care was time spent personally by me on the following activities: development of treatment plan with  patient and/or surrogate as well  as nursing, discussions with consultants, evaluation of patient's response to treatment, examination of patient, obtaining history from patient or surrogate, ordering and performing treatments and interventions, ordering and review of laboratory studies, ordering and review of radiographic studies, pulse oximetry and re-evaluation of patient's condition. Medications Ordered in ED Medications  vancomycin (VANCOCIN) IVPB 750 mg/150 ml premix (has no administration in time range)  piperacillin-tazobactam (ZOSYN) IVPB 3.375 g (has no administration in time range)  acetaminophen (TYLENOL) tablet 650 mg (650 mg Oral Given 11/02/17 1139)  sodium chloride 0.9 % bolus 1,000 mL (0 mLs Intravenous Stopped 11/02/17 1306)    And  sodium chloride 0.9 % bolus 1,000 mL (0 mLs Intravenous Stopped 11/02/17 1358)    And  sodium chloride 0.9 % bolus 1,000 mL (1,000 mLs Intravenous New Bag/Given 11/02/17 1334)  piperacillin-tazobactam (ZOSYN) IVPB 3.375 g (0 g Intravenous Stopped 11/02/17 1246)  vancomycin (VANCOCIN) IVPB 1000 mg/200 mL premix (0 mg Intravenous Stopped 11/02/17 1334)  morphine 4 MG/ML injection 4 mg (4 mg Intravenous Given 11/02/17 1213)  ondansetron (ZOFRAN) injection 4 mg (4 mg Intravenous Given 11/02/17 1212)  iopamidol (ISOVUE-300) 61 % injection 100 mL (100 mLs Intravenous Contrast Given 11/02/17 1259)     Initial Impression / Assessment and Plan / ED Course  I have reviewed the triage vital signs and the nursing notes.  Pertinent labs & imaging results that were available during my care of the patient were reviewed by me and considered in my medical decision making (see chart for details).      Final Clinical Impressions(s) / ED Diagnoses   Final diagnoses:  Community acquired pneumonia of left lower lobe of lung ([place])  Sepsis, due to unspecified organism [facility])   Patient presents with 4 days of feeling very poorly.  He is sent from an outpatient appointment.   She is febrile and tachycardic.  CT identifies pneumonia.  Patient has shown improvement with fluid hydration.  She is now alert and appropriate.  No respiratory distress at rest.  She remains mildly tachycardic but blood pressures are stable.  She does not require oxygen.  Plan to admit. ED Discharge Orders    None       Charlesetta Shanks, MD 11/02/17 1431

## 2017-11-02 NOTE — Progress Notes (Addendum)
Vancomycin per Pharmacy - Brief Note Full consult completed earlier today by pharmacist at [facility]  Patient transferred from [facility]  - h/o PCN, given zosyn x 2 doses today.  D/w [facility] admitting MD and will change orders to start levofloxacin and zosyn to cefepime as has tolerated cephalosporins in past Vancomycin dosed 752m q12h.  [facility] - monitor SCr - check levels if remains on vancomycin  DDoreene Eland PharmD, BCPS.   Pager: [phone]/05/2018 7:22 PM

## 2017-11-02 NOTE — ED Notes (Signed)
DISREGARD BP 89/75 - pt was asymptomatic and arm with BP cuff was moving; BP was rechecked and was 125/84

## 2017-11-02 NOTE — Plan of Care (Signed)

## 2017-11-02 NOTE — Progress Notes (Signed)
Lab came up to draw labs on patient and patient refusing for blood to be drawn at this time.

## 2017-11-02 NOTE — ED Triage Notes (Signed)
Pt c/o LLQ pain and vomiting x 4d; fever started yesterday

## 2017-11-02 NOTE — Progress Notes (Signed)
67 yo with hx of stomach ca s/p chemo/xrt, DM, HTN, TIA presenting to ED with malaise and abdominal discomfort.  Found to have fever, tachycardia, elevated lactate.  Pneumonia on CT.  Started on broad spectrum abx.  S/p 3 L NS bolus.  Pt on room air.  Still tachy, but improved per EDP.  Looking better.  Admit to stepdown.

## 2017-11-02 NOTE — ED Notes (Signed)
Patient transported to CT 

## 2017-11-02 NOTE — ED Notes (Signed)
Report to Geni Bers, Therapist, sports at [employer].

## 2017-11-02 NOTE — Telephone Encounter (Signed)
Patient presented to clinic today with hard chills she stated started this am.  Nurse practitioner saw patient and instructed nurse to have her seen in the ER downstairs.  Patient was transported to ER via wheelchair by Lorriane Shire, RN after report called to ER Physician by Burman Freestone.

## 2017-11-03 ENCOUNTER — Telehealth: Payer: Self-pay

## 2017-11-03 DIAGNOSIS — A419 Sepsis, unspecified organism: Principal | ICD-10-CM

## 2017-11-03 LAB — COMPREHENSIVE METABOLIC PANEL
ALBUMIN: 2.1 g/dL — AB (ref 3.5–5.0)
ALT: 37 U/L (ref 14–54)
AST: 38 U/L (ref 15–41)
Alkaline Phosphatase: 90 U/L (ref 38–126)
Anion gap: 9 (ref 5–15)
BUN: 20 mg/dL (ref 6–20)
CO2: 21 mmol/L — AB (ref 22–32)
CREATININE: 1.12 mg/dL — AB (ref 0.44–1.00)
Calcium: 7.7 mg/dL — ABNORMAL LOW (ref 8.9–10.3)
Chloride: 112 mmol/L — ABNORMAL HIGH (ref 101–111)
GFR calc Af Amer: 58 mL/min — ABNORMAL LOW (ref >60)
GFR, EST NON AFRICAN AMERICAN: 50 mL/min — AB (ref >60)
GLUCOSE: 152 mg/dL — AB (ref 65–99)
Potassium: 4 mmol/L (ref 3.5–5.1)
SODIUM: 142 mmol/L (ref 135–145)
Total Bilirubin: 0.6 mg/dL (ref 0.3–1.2)
Total Protein: 5 g/dL — ABNORMAL LOW (ref 6.5–8.1)

## 2017-11-03 LAB — CBC WITH DIFFERENTIAL/PLATELET
Basophils Absolute: 0.1 10*3/uL (ref 0.0–0.1)
Basophils Relative: 0 %
EOS ABS: 0.7 10*3/uL (ref 0.0–0.7)
EOS PCT: 4 %
HCT: 30.8 % — ABNORMAL LOW (ref 36.0–46.0)
Hemoglobin: 10.4 g/dL — ABNORMAL LOW (ref 12.0–15.0)
LYMPHS ABS: 1.9 10*3/uL (ref 0.7–4.0)
Lymphocytes Relative: 10 %
MCH: 30.6 pg (ref 26.0–34.0)
MCHC: 33.8 g/dL (ref 30.0–36.0)
MCV: 90.6 fL (ref 78.0–100.0)
MONO ABS: 1.1 10*3/uL — AB (ref 0.1–1.0)
MONOS PCT: 6 %
Neutro Abs: 14.6 10*3/uL — ABNORMAL HIGH (ref 1.7–7.7)
Neutrophils Relative %: 80 %
PLATELETS: 263 10*3/uL (ref 150–400)
RBC: 3.4 MIL/uL — ABNORMAL LOW (ref 3.87–5.11)
RDW: 14.7 % (ref 11.5–15.5)
WBC: 18.2 10*3/uL — ABNORMAL HIGH (ref 4.0–10.5)

## 2017-11-03 LAB — GLUCOSE, CAPILLARY
GLUCOSE-CAPILLARY: 161 mg/dL — AB (ref 65–99)
Glucose-Capillary: 113 mg/dL — ABNORMAL HIGH (ref 65–99)
Glucose-Capillary: 183 mg/dL — ABNORMAL HIGH (ref 65–99)
Glucose-Capillary: 98 mg/dL (ref 65–99)

## 2017-11-03 LAB — URINE CULTURE

## 2017-11-03 LAB — HEMOGLOBIN A1C
HEMOGLOBIN A1C: 6.8 % — AB (ref 4.8–5.6)
Mean Plasma Glucose: 148.46 mg/dL

## 2017-11-03 MED ORDER — SODIUM CHLORIDE 0.9% FLUSH: 10.0000 mL | INTRAVENOUS | Status: DC | PRN

## 2017-11-03 MED ORDER — SODIUM CHLORIDE 0.9 % IV SOLN
500.0000 mg | INTRAVENOUS | Status: DC
  Administered 2017-11-03: 500 mg via INTRAVENOUS
  Filled 2017-11-03 (×2): qty 500

## 2017-11-03 MED ORDER — SODIUM CHLORIDE 0.9% FLUSH
10.0000 mL | Freq: Two times a day (BID) | INTRAVENOUS | Status: DC
  Administered 2017-11-03: 10 mL

## 2017-11-03 MED ORDER — LIDOCAINE-PRILOCAINE 2.5-2.5 % EX CREA
TOPICAL_CREAM | CUTANEOUS | Status: DC | PRN
  Administered 2017-11-03: 1 via TOPICAL
  Filled 2017-11-03: qty 5

## 2017-11-03 MED ORDER — CHLORHEXIDINE GLUCONATE CLOTH 2 % EX PADS
6.0000 | MEDICATED_PAD | Freq: Every day | CUTANEOUS | Status: DC
  Administered 2017-11-03 – 2017-11-04 (×2): 6 via TOPICAL

## 2017-11-03 MED ORDER — SODIUM CHLORIDE 0.9 % IV SOLN
1.0000 g | INTRAVENOUS | Status: DC
  Administered 2017-11-03 – 2017-11-04 (×2): 1 g via INTRAVENOUS
  Filled 2017-11-03: qty 10
  Filled 2017-11-03 (×2): qty 1

## 2017-11-03 NOTE — Progress Notes (Signed)
Patient Demographics:    [name], is a 67 y.o. female, DOB - October 29, 1950, ZOX:[id number]  Admit date - 11/02/2017   Admitting Physician A Melven Sartorius., MD  Outpatient Primary MD for the patient is Carollee Herter, Alferd Apa, DO  LOS - 1   Chief Complaint  Patient presents with  . Abdominal Pain  . Fever        Subjective:    [name] today has no fevers, no emesis,  No chest pain, cough and shortness of breath is much better,  Assessment  & Plan :    Principal Problem:   Sepsis ([place]) Active Problems:   CHRONIC RESPIRATORY FAILURE   GERD (gastroesophageal reflux disease)   Diabetes mellitus with circulatory complication, without long-term current use of insulin (HCC)   CKD (chronic kidney disease), stage III (HCC)   AKI (acute kidney injury) (HCC)   Pneumonia   Leucocytosis   #1 sepsis: Secondary to pneumonia.  MRSA PCR negative, patient is improving, okay to de-escalate antibiotics, stop vancomycin, stop cefepime, treat empirically with Rocephin and azithromycin  #2 pneumonia: Most likely community-acquired pneumonia b antibiotics as above #1, continue bronchodilatation mucolytics as ordered  #3 chronic kidney disease stage III: Appears to be at baseline. Continue monitoring  #4 diabetes: Blood sugar is controlled continue her long-acting insulin with sliding scale insulin. Hold metformin  #5 GERD: Continue PPIs  #6 history of stomach ca s/p chemo/xrt : Patient is following up with oncologist Dr Martha Clan as needed. Last visit was last year.    Code Status : full   Disposition Plan  : home     DVT Prophylaxis  :    Heparin    Lab Results  Component Value Date   PLT 263 11/03/2017    Inpatient Medications  Scheduled Meds: . aspirin EC  81 mg Oral QHS  . Chlorhexidine Gluconate Cloth  6 each Topical Daily  . DULoxetine  60 mg Oral BID  . gabapentin  800 mg Oral  TID  . glimepiride  4 mg Oral BID AC  . heparin  5,000 Units Subcutaneous Q8H  . insulin aspart  0-15 Units Subcutaneous TID WC  . morphine  30 mg Oral Q12H  . multivitamin with minerals  1 tablet Oral Daily  . sodium chloride flush  10-40 mL Intracatheter Q12H  . sucralfate  1 g Oral TID WC & HS   Continuous Infusions: . sodium chloride 100 mL/hr at 11/02/17 2017  . azithromycin    . cefTRIAXone (ROCEPHIN)  IV     PRN Meds:.ipratropium-albuterol, lidocaine-prilocaine, sodium chloride flush, traMADol    Anti-infectives (From admission, onward)   Start     Dose/Rate Route Frequency Ordered Stop   11/03/17 1900  cefTRIAXone (ROCEPHIN) 1 g in sodium chloride 0.9 % 100 mL IVPB     1 g 200 mL/hr over 30 Minutes Intravenous Every 24 hours 11/03/17 1856     11/03/17 1900  azithromycin (ZITHROMAX) 500 mg in sodium chloride 0.9 % 250 mL IVPB     500 mg 250 mL/hr over 60 Minutes Intravenous Every 24 hours 11/03/17 1856     11/02/17 2300  vancomycin (VANCOCIN) IVPB 750 mg/150 ml premix  Status:  Discontinued     750  mg 150 mL/hr over 60 Minutes Intravenous Every 12 hours 11/02/17 1303 11/02/17 1923   11/02/17 2200  levofloxacin (LEVAQUIN) IVPB 750 mg  Status:  Discontinued     750 mg 100 mL/hr over 90 Minutes Intravenous Every 24 hours 11/02/17 1911 11/02/17 1932   11/02/17 2200  vancomycin (VANCOCIN) IVPB 1000 mg/200 mL premix  Status:  Discontinued     1,000 mg 200 mL/hr over 60 Minutes Intravenous Every 24 hours 11/02/17 1923 11/03/17 1856   11/02/17 2200  ceFEPIme (MAXIPIME) 1 g in sodium chloride 0.9 % 100 mL IVPB  Status:  Discontinued     1 g 200 mL/hr over 30 Minutes Intravenous Every 8 hours 11/02/17 1945 11/03/17 1856   11/02/17 1400  piperacillin-tazobactam (ZOSYN) IVPB 3.375 g  Status:  Discontinued     3.375 g 12.5 mL/hr over 240 Minutes Intravenous Every 8 hours 11/02/17 1303 11/02/17 1932   11/02/17 1145  piperacillin-tazobactam (ZOSYN) IVPB 3.375 g     3.375 g 100  mL/hr over 30 Minutes Intravenous  Once 11/02/17 1143 11/02/17 1246   11/02/17 1145  vancomycin (VANCOCIN) IVPB 1000 mg/200 mL premix     1,000 mg 200 mL/hr over 60 Minutes Intravenous  Once 11/02/17 1143 11/02/17 1334        Objective:   Vitals:   11/03/17 0800 11/03/17 1200 11/03/17 1600 11/03/17 1703  BP:   119/66 123/67  Pulse:   89   Resp:   20 18  Temp: 98.5 F (36.9 C) 98.5 F (36.9 C)  98 F (36.7 C)  TempSrc: Oral Oral  Oral  SpO2:   100% 100%  Weight:      Height:        Wt Readings from Last 3 Encounters:  11/03/17 98.9 kg (218 lb 0.6 oz)  11/02/17 95.7 kg (211 lb)  07/06/17 94.5 kg (208 lb 4 oz)     Intake/Output Summary (Last 24 hours) at 11/03/2017 1856 Last data filed at 11/03/2017 1849 Gross per 24 hour  Intake 3241.67 ml  Output 3650 ml  Net -408.33 ml     Physical Exam  Gen:- Awake Alert, able to speak in sentences HEENT:- [place].AT, No sclera icterus Neck-Supple Neck,No JVD,.  Lungs-few scattered rhonchi , air movement is fair  CV- S1, S2 normal Abd-  +ve B.Sounds, Abd Soft, No tenderness,    Extremity/Skin:- No  edema,   negative [place] Psych-affect is appropriate, oriented x3 Neuro-no new focal deficits, no tremors   Data Review:   Micro Results Recent Results (from the past 240 hour(s))  Blood Culture (routine x 2)     Status: None (Preliminary result)   Collection Time: 11/02/17 11:15 AM  Result Value Ref Range Status   Specimen Description   Final    BLOOD LEFT ANTECUBITAL Performed at [facility], [place] [address], [place], [place] [address]    Special Requests   Final    BLOOD Blood Culture adequate volume Performed at [facility], [address] [place], [place] [address]    Culture   Final    NO GROWTH < 24 HOURS Performed at [facility], [place] [address] [place], [place] [address]    Report Status PENDING  Incomplete  Blood Culture (routine x 2)     Status: None (Preliminary result)   Collection  Time: 11/02/17 11:55 AM  Result Value Ref Range Status   Specimen Description   Final    BLOOD RIGHT ANTECUBITAL Performed  at [facility], [place] [address], [place], [place] [address]    Special Requests   Final    BLOOD Blood Culture results may not be optimal due to an inadequate volume of blood received in culture bottles Performed at [facility], [address] [place], [place] [address]    Culture   Final    NO GROWTH < 24 HOURS Performed at [facility], [place] [address] [place], [place] [address]    Report Status PENDING  Incomplete  Urine culture     Status: Abnormal   Collection Time: 11/02/17 12:22 PM  Result Value Ref Range Status   Specimen Description   Final    URINE, CATHETERIZED Performed at [facility], [address] [place], [place] [address]    Special Requests   Final    NONE Performed at [facility], [address] [place], [place] [address]    Culture MULTIPLE SPECIES PRESENT, SUGGEST RECOLLECTION (A)  Final   Report Status 11/03/2017 FINAL  Final  MRSA PCR Screening     Status: None   Collection Time: 11/02/17  6:42 PM  Result Value Ref Range Status   MRSA by PCR NEGATIVE NEGATIVE Final    Comment:        The GeneXpert MRSA Assay (FDA approved for NASAL specimens only), is one component of a comprehensive MRSA colonization surveillance program. It is not intended to diagnose MRSA infection nor to guide or monitor treatment for MRSA infections. Performed at [facility], [place] [address], [place], [place] [address]     Radiology Reports Ct Chest W Contrast  Result Date: 11/02/2017 CLINICAL DATA:  Shortness of breath and abdominal pain. History of gastric cancer. EXAM: CT CHEST, ABDOMEN, AND PELVIS WITH CONTRAST TECHNIQUE: Multidetector CT imaging of the chest, abdomen and pelvis was performed following the standard protocol during bolus administration of intravenous contrast.  CONTRAST:  1100m ISOVUE-300 IOPAMIDOL (ISOVUE-300) INJECTION 61% COMPARISON:  CT abdomen pelvis dated Dec 17, 2016. CT chest dated Dec 21, 2011. FINDINGS: CT CHEST FINDINGS Cardiovascular: Normal heart size. No pericardial effusion. Normal caliber thoracic aorta. Coronary, aortic arch, and branch vessel atherosclerotic vascular disease. Right chest wall port catheter with the tip in the distal SVC. Mediastinum/Nodes: No enlarged mediastinal, hilar, or axillary lymph nodes. There is a 1.3 cm hypodense nodule within the right thyroid lobe with small surrounding punctate calcifications. The trachea and esophagus demonstrate no significant abnormalities. Small hiatal hernia, unchanged. Lungs/Pleura: Patchy consolidation within the left upper lobe. No pleural effusion or pneumothorax. No suspicious pulmonary nodule. Musculoskeletal: No chest wall abnormality. No acute or significant osseous findings. CT ABDOMEN PELVIS FINDINGS Hepatobiliary: Diffuse hepatic steatosis. Mild gallbladder distention without wall thickening or gallstones. No biliary dilatation. Pancreas: Atrophic. No pancreatic ductal dilatation or surrounding inflammatory changes. Spleen: Normal in size without focal abnormality. Adrenals/Urinary Tract: Adrenal glands are unremarkable. Kidneys are normal, without renal calculi, focal lesion, or hydronephrosis. Bladder is unremarkable. Stomach/Bowel: Postsurgical changes related to prior distal antrectomy and gastrojejunostomy with patent anastomosis. No bowel wall thickening, distention, or surrounding inflammatory changes. Mild sigmoid diverticulosis. Moderate colonic stool burden. Prior appendectomy. Vascular/Lymphatic: Aortic atherosclerosis. No enlarged abdominal or pelvic lymph nodes. Reproductive: Status post hysterectomy. No adnexal masses. Other: No free fluid or pneumoperitoneum. Postsurgical changes along the anterior abdominal wall. Musculoskeletal: Prior right total hip arthroplasty. No acute or  significant osseous findings. IMPRESSION: 1. Left upper lobe bronchopneumonia. 2.  No  acute intra-abdominal process. 3. Diffuse hepatic steatosis. 4.  Prominent stool throughout the colon favors constipation. 5.  Aortic atherosclerosis (ICD10-I70.0). Electronically Signed   By: Titus Dubin M.D.   On: 11/02/2017 13:48   Ct Abdomen Pelvis W Contrast  Result Date: 11/02/2017 CLINICAL DATA:  Shortness of breath and abdominal pain. History of gastric cancer. EXAM: CT CHEST, ABDOMEN, AND PELVIS WITH CONTRAST TECHNIQUE: Multidetector CT imaging of the chest, abdomen and pelvis was performed following the standard protocol during bolus administration of intravenous contrast. CONTRAST:  171m ISOVUE-300 IOPAMIDOL (ISOVUE-300) INJECTION 61% COMPARISON:  CT abdomen pelvis dated Dec 17, 2016. CT chest dated Dec 21, 2011. FINDINGS: CT CHEST FINDINGS Cardiovascular: Normal heart size. No pericardial effusion. Normal caliber thoracic aorta. Coronary, aortic arch, and branch vessel atherosclerotic vascular disease. Right chest wall port catheter with the tip in the distal SVC. Mediastinum/Nodes: No enlarged mediastinal, hilar, or axillary lymph nodes. There is a 1.3 cm hypodense nodule within the right thyroid lobe with small surrounding punctate calcifications. The trachea and esophagus demonstrate no significant abnormalities. Small hiatal hernia, unchanged. Lungs/Pleura: Patchy consolidation within the left upper lobe. No pleural effusion or pneumothorax. No suspicious pulmonary nodule. Musculoskeletal: No chest wall abnormality. No acute or significant osseous findings. CT ABDOMEN PELVIS FINDINGS Hepatobiliary: Diffuse hepatic steatosis. Mild gallbladder distention without wall thickening or gallstones. No biliary dilatation. Pancreas: Atrophic. No pancreatic ductal dilatation or surrounding inflammatory changes. Spleen: Normal in size without focal abnormality. Adrenals/Urinary Tract: Adrenal glands are unremarkable.  Kidneys are normal, without renal calculi, focal lesion, or hydronephrosis. Bladder is unremarkable. Stomach/Bowel: Postsurgical changes related to prior distal antrectomy and gastrojejunostomy with patent anastomosis. No bowel wall thickening, distention, or surrounding inflammatory changes. Mild sigmoid diverticulosis. Moderate colonic stool burden. Prior appendectomy. Vascular/Lymphatic: Aortic atherosclerosis. No enlarged abdominal or pelvic lymph nodes. Reproductive: Status post hysterectomy. No adnexal masses. Other: No free fluid or pneumoperitoneum. Postsurgical changes along the anterior abdominal wall. Musculoskeletal: Prior right total hip arthroplasty. No acute or significant osseous findings. IMPRESSION: 1. Left upper lobe bronchopneumonia. 2.  No acute intra-abdominal process. 3. Diffuse hepatic steatosis. 4.  Prominent stool throughout the colon favors constipation. 5.  Aortic atherosclerosis (ICD10-I70.0). Electronically Signed   By: WTitus DubinM.D.   On: 11/02/2017 13:48     CBC Recent Labs  Lab 11/02/17 1115 11/03/17 0704  WBC 14.7* 18.2*  HGB 12.5 10.4*  HCT 37.0 30.8*  PLT 313 263  MCV 88.9 90.6  MCH 30.0 30.6  MCHC 33.8 33.8  RDW 14.5 14.7  LYMPHSABS 1.2 1.9  MONOABS 1.2* 1.1*  EOSABS 0.1 0.7  BASOSABS 0.0 0.1    Chemistries  Recent Labs  Lab 11/02/17 1115 11/03/17 0704  NA 140 142  K 5.1 4.0  CL 108 112*  CO2 21* 21*  GLUCOSE 87 152*  BUN 24* 20  CREATININE 1.30* 1.12*  CALCIUM 8.8* 7.7*  AST 60* 38  ALT 47 37  ALKPHOS 109 90  BILITOT 0.7 0.6   ------------------------------------------------------------------------------------------------------------------ No results for input(s): CHOL, HDL, LDLCALC, TRIG, CHOLHDL, LDLDIRECT in the last 72 hours.  Lab Results  Component Value Date   HGBA1C 6.8 (H) 11/03/2017   ------------------------------------------------------------------------------------------------------------------ No results for  input(s): TSH, T4TOTAL, T3FREE, THYROIDAB in the last 72 hours.  Invalid input(s): FREET3 ------------------------------------------------------------------------------------------------------------------ No results for input(s): VITAMINB12, FOLATE, FERRITIN, TIBC, IRON, RETICCTPCT in the last 72 hours.  Coagulation profile No results for input(s): INR, PROTIME in the last 168 hours.  No results for input(s): DDIMER in the last  72 hours.  Cardiac Enzymes Recent Labs  Lab 11/02/17 1115  TROPONINI <0.03   ------------------------------------------------------------------------------------------------------------------    Component Value Date/Time   BNP 41.6 11/02/2017 1115     Kline Bulthuis M.D on 11/03/2017 at 6:56 PM  Between 7am to 7pm - Pager - [phone]  After 7pm go to [email or web address] - password [email or web address]  [facility] -  Office  [phone]   Voice Recognition Viviann Spare dictation system was used to create this note, attempts have been made to correct errors. Please contact the author with questions and/or clarifications.

## 2017-11-03 NOTE — Progress Notes (Signed)
Paged doctor as patient is requesting numbing cream for port to be accessed by IV team. Awaiting orders.

## 2017-11-03 NOTE — Progress Notes (Signed)
Patient refusing for lab to draw blood at this time.

## 2017-11-03 NOTE — Telephone Encounter (Signed)
Received call from pt wishing to update Dr Marin Olp on her hospitalization. [name] is aware MD is out of the office and will be notified upon his return on Monday. dph

## 2017-11-04 LAB — CBC
HEMATOCRIT: 29.9 % — AB (ref 36.0–46.0)
Hemoglobin: 9.9 g/dL — ABNORMAL LOW (ref 12.0–15.0)
MCH: 30.3 pg (ref 26.0–34.0)
MCHC: 33.1 g/dL (ref 30.0–36.0)
MCV: 91.4 fL (ref 78.0–100.0)
Platelets: 269 10*3/uL (ref 150–400)
RBC: 3.27 MIL/uL — ABNORMAL LOW (ref 3.87–5.11)
RDW: 14.8 % (ref 11.5–15.5)
WBC: 10 10*3/uL (ref 4.0–10.5)

## 2017-11-04 LAB — GLUCOSE, CAPILLARY
GLUCOSE-CAPILLARY: 58 mg/dL — AB (ref 65–99)
GLUCOSE-CAPILLARY: 132 mg/dL — AB (ref 65–99)
GLUCOSE-CAPILLARY: 160 mg/dL — AB (ref 65–99)
GLUCOSE-CAPILLARY: 106 mg/dL — AB (ref 65–99)
Glucose-Capillary: 60 mg/dL — ABNORMAL LOW (ref 65–99)
Glucose-Capillary: 207 mg/dL — ABNORMAL HIGH (ref 65–99)
Glucose-Capillary: 38 mg/dL — CL (ref 65–99)
Glucose-Capillary: 37 mg/dL — CL (ref 65–99)
Glucose-Capillary: 47 mg/dL — ABNORMAL LOW (ref 65–99)
Glucose-Capillary: 58 mg/dL — ABNORMAL LOW (ref 65–99)

## 2017-11-04 LAB — BASIC METABOLIC PANEL
Anion gap: 7 (ref 5–15)
BUN: 17 mg/dL (ref 6–20)
CHLORIDE: 112 mmol/L — AB (ref 101–111)
CO2: 24 mmol/L (ref 22–32)
CREATININE: 1.31 mg/dL — AB (ref 0.44–1.00)
Calcium: 7.8 mg/dL — ABNORMAL LOW (ref 8.9–10.3)
GFR calc non Af Amer: 42 mL/min — ABNORMAL LOW (ref >60)
GFR, EST AFRICAN AMERICAN: 48 mL/min — AB (ref >60)
GLUCOSE: 94 mg/dL (ref 65–99)
Potassium: 4.1 mmol/L (ref 3.5–5.1)
Sodium: 143 mmol/L (ref 135–145)

## 2017-11-04 MED ORDER — DEXTROSE 50 % IV SOLN
INTRAVENOUS | Status: AC
  Filled 2017-11-04: qty 50

## 2017-11-04 MED ORDER — GLUCOSE 40 % PO GEL
1.0000 | Freq: Once | ORAL | Status: AC
  Administered 2017-11-04: 37.5 g via ORAL
  Filled 2017-11-04: qty 1

## 2017-11-04 MED ORDER — AZITHROMYCIN 250 MG PO TABS
500.0000 mg | ORAL_TABLET | Freq: Every day | ORAL | Status: DC
Start: 2017-11-04 — End: 2017-11-05
  Administered 2017-11-04: 500 mg via ORAL
  Filled 2017-11-04: qty 2

## 2017-11-04 MED ORDER — GLUCOSE 40 % PO GEL
ORAL | Status: AC
  Administered 2017-11-04: 37.5 g
  Filled 2017-11-04: qty 1

## 2017-11-04 MED ORDER — DEXTROSE 50 % IV SOLN
25.0000 mL | Freq: Once | INTRAVENOUS | Status: AC
  Administered 2017-11-04: 25 mL via INTRAVENOUS

## 2017-11-04 MED ORDER — ALUM & MAG HYDROXIDE-SIMETH 200-200-20 MG/5ML PO SUSP
30.0000 mL | Freq: Once | ORAL | Status: DC
  Filled 2017-11-04: qty 30

## 2017-11-04 MED ORDER — GLUCOSE 40 % PO GEL: 1.0000 | Freq: Once | ORAL | Status: DC

## 2017-11-04 MED ORDER — MAGIC MOUTHWASH
5.0000 mL | Freq: Four times a day (QID) | ORAL | Status: DC | PRN
  Administered 2017-11-04: 5 mL via ORAL
  Filled 2017-11-04 (×2): qty 5

## 2017-11-04 NOTE — Progress Notes (Signed)
PHARMACIST - PHYSICIAN COMMUNICATION DR:   Denton Brick CONCERNING: Antibiotic IV to Oral Route Change Policy  RECOMMENDATION: This patient is receiving Zithromax by the intravenous route.  Based on criteria approved by the Pharmacy and Therapeutics Committee, the antibiotic(s) is/are being converted to the equivalent oral dose form(s).   DESCRIPTION: These criteria include:  Patient being treated for a respiratory tract infection, urinary tract infection, cellulitis or clostridium difficile associated diarrhea if on metronidazole  The patient is not neutropenic and does not exhibit a GI malabsorption state  The patient is eating (either orally or via tube) and/or has been taking other orally administered medications for a least 24 hours  The patient is improving clinically and has a Tmax < 100.5  If you have questions about this conversion, please contact the Pharmacy Department  []   [phone] )  [facility] []   [phone] )  [facility] []   [phone] )  [facility] []   [phone] )  [facility] [x]   [phone] )  [place], PharmD, BCPS 11/04/2017@12 :15 PM

## 2017-11-04 NOTE — Progress Notes (Signed)
Hypoglycemic Event  CBG: 60- at 0720   Treatment: 15 GM carbohydrate snack  Symptoms: None  Follow-up CBG: Time:0820 CBG Result:58  Possible Reasons for Event: Unknown  Comments/MD notified:    [name]

## 2017-11-04 NOTE — Progress Notes (Signed)
Patient Demographics:    [name], is a 67 y.o. female, DOB - 19-Mar-1951, PJA:[id number]  Admit date - 11/02/2017   Admitting Physician A Melven Sartorius., MD  Outpatient Primary MD for the patient is Carollee Herter, Alferd Apa, DO  LOS - 2   Chief Complaint  Patient presents with  . Abdominal Pain  . Fever        Subjective:    [name] today has no fevers, no emesis,  No chest pain, feels better, has BM, cough persist  Assessment  & Plan :    Principal Problem:   Sepsis ([place]) Active Problems:   CHRONIC RESPIRATORY FAILURE   GERD (gastroesophageal reflux disease)   Diabetes mellitus with circulatory complication, without long-term current use of insulin (HCC)   CKD (chronic kidney disease), stage III (HCC)   AKI (acute kidney injury) ([name])   Pneumonia   Leucocytosis   #1 Sepsis: Secondary to pneumonia.  MRSA PCR negative, patient is improving,  de-escalated antibiotics, stopped vancomycin, stopped cefepime,  C/n Rocephin and azithromycin , leukocytosis is resolved, afebrile  #2 pneumonia:  clinically improving, shortness of breath is better, cough persists, most likely community-acquired pneumonia  antibiotics as above #1, continue bronchodilatation mucolytics as ordered  #3 chronic kidney disease stage III: Appears to be at baseline. Continue monitoring  #4 diabetes: Blood sugar is controlled continue her long-acting insulin with sliding scale insulin. Hold metformin  #5 GERD: Continue PPIs  #6 history of stomach ca s/p chemo/xrt : Patient is following up with oncologist Dr Martha Clan as needed. Last visit was last year.    Code Status : full   Disposition Plan  : home 11/05/17 if continues to improve   DVT Prophylaxis  :    Heparin    Lab Results  Component Value Date   PLT 269 11/04/2017    Inpatient Medications  Scheduled Meds: . alum & mag hydroxide-simeth  30 mL  Oral Once  . aspirin EC  81 mg Oral QHS  . azithromycin  500 mg Oral QHS  . Chlorhexidine Gluconate Cloth  6 each Topical Daily  . DULoxetine  60 mg Oral BID  . gabapentin  800 mg Oral TID  . glimepiride  4 mg Oral BID AC  . heparin  5,000 Units Subcutaneous Q8H  . insulin aspart  0-15 Units Subcutaneous TID WC  . morphine  30 mg Oral Q12H  . multivitamin with minerals  1 tablet Oral Daily  . sodium chloride flush  10-40 mL Intracatheter Q12H  . sucralfate  1 g Oral TID WC & HS   Continuous Infusions: . sodium chloride 100 mL/hr at 11/04/17 1045  . cefTRIAXone (ROCEPHIN)  IV Stopped (11/04/17 0019)   PRN Meds:.ipratropium-albuterol, lidocaine-prilocaine, sodium chloride flush, traMADol    Anti-infectives (From admission, onward)   Start     Dose/Rate Route Frequency Ordered Stop   11/04/17 2200  azithromycin (ZITHROMAX) tablet 500 mg     500 mg Oral Daily at bedtime 11/04/17 1216     11/03/17 2200  cefTRIAXone (ROCEPHIN) 1 g in sodium chloride 0.9 % 100 mL IVPB     1 g 200 mL/hr over 30 Minutes Intravenous Every 24 hours 11/03/17 1856     11/03/17 2000  azithromycin (ZITHROMAX) 500 mg in sodium chloride 0.9 % 250 mL IVPB  Status:  Discontinued     500 mg 250 mL/hr over 60 Minutes Intravenous Every 24 hours 11/03/17 1856 11/04/17 1216   11/02/17 2300  vancomycin (VANCOCIN) IVPB 750 mg/150 ml premix  Status:  Discontinued     750 mg 150 mL/hr over 60 Minutes Intravenous Every 12 hours 11/02/17 1303 11/02/17 1923   11/02/17 2200  levofloxacin (LEVAQUIN) IVPB 750 mg  Status:  Discontinued     750 mg 100 mL/hr over 90 Minutes Intravenous Every 24 hours 11/02/17 1911 11/02/17 1932   11/02/17 2200  vancomycin (VANCOCIN) IVPB 1000 mg/200 mL premix  Status:  Discontinued     1,000 mg 200 mL/hr over 60 Minutes Intravenous Every 24 hours 11/02/17 1923 11/03/17 1856   11/02/17 2200  ceFEPIme (MAXIPIME) 1 g in sodium chloride 0.9 % 100 mL IVPB  Status:  Discontinued     1 g 200 mL/hr  over 30 Minutes Intravenous Every 8 hours 11/02/17 1945 11/03/17 1856   11/02/17 1400  piperacillin-tazobactam (ZOSYN) IVPB 3.375 g  Status:  Discontinued     3.375 g 12.5 mL/hr over 240 Minutes Intravenous Every 8 hours 11/02/17 1303 11/02/17 1932   11/02/17 1145  piperacillin-tazobactam (ZOSYN) IVPB 3.375 g     3.375 g 100 mL/hr over 30 Minutes Intravenous  Once 11/02/17 1143 11/02/17 1246   11/02/17 1145  vancomycin (VANCOCIN) IVPB 1000 mg/200 mL premix     1,000 mg 200 mL/hr over 60 Minutes Intravenous  Once 11/02/17 1143 11/02/17 1334        Objective:   Vitals:   11/03/17 1703 11/03/17 2100 11/04/17 0531 11/04/17 1341  BP: 123/67 124/69 106/72 119/77  Pulse:  99 92 97  Resp: 18 16 18 16   Temp: 98 F (36.7 C) 98.5 F (36.9 C) 98.6 F (37 C) 98.9 F (37.2 C)  TempSrc: Oral Oral Oral Oral  SpO2: 100% 100% 97% 98%  Weight:      Height:        Wt Readings from Last 3 Encounters:  11/03/17 98.9 kg (218 lb 0.6 oz)  11/02/17 95.7 kg (211 lb)  07/06/17 94.5 kg (208 lb 4 oz)     Intake/Output Summary (Last 24 hours) at 11/04/2017 1744 Last data filed at 11/04/2017 0848 Gross per 24 hour  Intake 2340 ml  Output 1150 ml  Net 1190 ml     Physical Exam  Gen:- Awake Alert, able to speak in sentences HEENT:- [place].AT, No sclera icterus Neck-Supple Neck,No JVD,.  Lungs-few scattered rhonchi , improving air movement, no wheezing CV- S1, S2 normal, right chest Port-A-Cath site is clean dry and intact Abd-  +ve B.Sounds, Abd Soft, No tenderness,    Extremity/Skin:- trace edema,   negative Homans Psych-affect is appropriate, oriented x3 Neuro-no new focal deficits, no tremors   Data Review:   Micro Results Recent Results (from the past 240 hour(s))  Blood Culture (routine x 2)     Status: None (Preliminary result)   Collection Time: 11/02/17 11:15 AM  Result Value Ref Range Status   Specimen Description BLOOD LEFT ANTECUBITAL  Final   Special Requests   Final     BOTTLES DRAWN AEROBIC AND ANAEROBIC Blood Culture adequate volume   Culture   Final    NO GROWTH 2 DAYS Performed at [facility], [place] [address] [place], [place] [address]    Report Status PENDING  Incomplete  Blood Culture (routine  x 2)     Status: None (Preliminary result)   Collection Time: 11/02/17 11:55 AM  Result Value Ref Range Status   Specimen Description BLOOD RIGHT ANTECUBITAL  Final   Special Requests   Final    BOTTLES DRAWN AEROBIC AND ANAEROBIC Blood Culture results may not be optimal due to an inadequate volume of blood received in culture bottles   Culture   Final    NO GROWTH 2 DAYS Performed at [facility] [address] [place], [place] [address]    Report Status PENDING  Incomplete  Urine culture     Status: Abnormal   Collection Time: 11/02/17 12:22 PM  Result Value Ref Range Status   Specimen Description   Final    URINE, CATHETERIZED Performed at [facility], [address] [place], [place] [address]    Special Requests   Final    NONE Performed at [facility], [address] [place], [place] [address]    Culture MULTIPLE SPECIES PRESENT, SUGGEST RECOLLECTION (A)  Final   Report Status 11/03/2017 FINAL  Final  MRSA PCR Screening     Status: None   Collection Time: 11/02/17  6:42 PM  Result Value Ref Range Status   MRSA by PCR NEGATIVE NEGATIVE Final    Comment:        The GeneXpert MRSA Assay (FDA approved for NASAL specimens only), is one component of a comprehensive MRSA colonization surveillance program. It is not intended to diagnose MRSA infection nor to guide or monitor treatment for MRSA infections. Performed at [facility], [place] [address] [place], [place] [address]     Radiology Reports Ct Chest W Contrast  Result Date: 11/02/2017 CLINICAL DATA:  Shortness of breath and abdominal pain. History of gastric cancer. EXAM: CT CHEST, ABDOMEN, AND PELVIS WITH CONTRAST TECHNIQUE:  Multidetector CT imaging of the chest, abdomen and pelvis was performed following the standard protocol during bolus administration of intravenous contrast. CONTRAST:  170m ISOVUE-300 IOPAMIDOL (ISOVUE-300) INJECTION 61% COMPARISON:  CT abdomen pelvis dated Dec 17, 2016. CT chest dated Dec 21, 2011. FINDINGS: CT CHEST FINDINGS Cardiovascular: Normal heart size. No pericardial effusion. Normal caliber thoracic aorta. Coronary, aortic arch, and branch vessel atherosclerotic vascular disease. Right chest wall port catheter with the tip in the distal SVC. Mediastinum/Nodes: No enlarged mediastinal, hilar, or axillary lymph nodes. There is a 1.3 cm hypodense nodule within the right thyroid lobe with small surrounding punctate calcifications. The trachea and esophagus demonstrate no significant abnormalities. Small hiatal hernia, unchanged. Lungs/Pleura: Patchy consolidation within the left upper lobe. No pleural effusion or pneumothorax. No suspicious pulmonary nodule. Musculoskeletal: No chest wall abnormality. No acute or significant osseous findings. CT ABDOMEN PELVIS FINDINGS Hepatobiliary: Diffuse hepatic steatosis. Mild gallbladder distention without wall thickening or gallstones. No biliary dilatation. Pancreas: Atrophic. No pancreatic ductal dilatation or surrounding inflammatory changes. Spleen: Normal in size without focal abnormality. Adrenals/Urinary Tract: Adrenal glands are unremarkable. Kidneys are normal, without renal calculi, focal lesion, or hydronephrosis. Bladder is unremarkable. Stomach/Bowel: Postsurgical changes related to prior distal antrectomy and gastrojejunostomy with patent anastomosis. No bowel wall thickening, distention, or surrounding inflammatory changes. Mild sigmoid diverticulosis. Moderate colonic stool burden. Prior appendectomy. Vascular/Lymphatic: Aortic atherosclerosis. No enlarged abdominal or pelvic lymph nodes. Reproductive: Status post hysterectomy. No adnexal masses. Other:  No free fluid or pneumoperitoneum. Postsurgical changes along the anterior abdominal wall. Musculoskeletal: Prior right total hip arthroplasty. No acute or significant osseous findings. IMPRESSION: 1.  Left upper lobe bronchopneumonia. 2.  No acute intra-abdominal process. 3. Diffuse hepatic steatosis. 4.  Prominent stool throughout the colon favors constipation. 5.  Aortic atherosclerosis (ICD10-I70.0). Electronically Signed   By: Titus Dubin M.D.   On: 11/02/2017 13:48   Ct Abdomen Pelvis W Contrast  Result Date: 11/02/2017 CLINICAL DATA:  Shortness of breath and abdominal pain. History of gastric cancer. EXAM: CT CHEST, ABDOMEN, AND PELVIS WITH CONTRAST TECHNIQUE: Multidetector CT imaging of the chest, abdomen and pelvis was performed following the standard protocol during bolus administration of intravenous contrast. CONTRAST:  149m ISOVUE-300 IOPAMIDOL (ISOVUE-300) INJECTION 61% COMPARISON:  CT abdomen pelvis dated Dec 17, 2016. CT chest dated Dec 21, 2011. FINDINGS: CT CHEST FINDINGS Cardiovascular: Normal heart size. No pericardial effusion. Normal caliber thoracic aorta. Coronary, aortic arch, and branch vessel atherosclerotic vascular disease. Right chest wall port catheter with the tip in the distal SVC. Mediastinum/Nodes: No enlarged mediastinal, hilar, or axillary lymph nodes. There is a 1.3 cm hypodense nodule within the right thyroid lobe with small surrounding punctate calcifications. The trachea and esophagus demonstrate no significant abnormalities. Small hiatal hernia, unchanged. Lungs/Pleura: Patchy consolidation within the left upper lobe. No pleural effusion or pneumothorax. No suspicious pulmonary nodule. Musculoskeletal: No chest wall abnormality. No acute or significant osseous findings. CT ABDOMEN PELVIS FINDINGS Hepatobiliary: Diffuse hepatic steatosis. Mild gallbladder distention without wall thickening or gallstones. No biliary dilatation. Pancreas: Atrophic. No pancreatic ductal  dilatation or surrounding inflammatory changes. Spleen: Normal in size without focal abnormality. Adrenals/Urinary Tract: Adrenal glands are unremarkable. Kidneys are normal, without renal calculi, focal lesion, or hydronephrosis. Bladder is unremarkable. Stomach/Bowel: Postsurgical changes related to prior distal antrectomy and gastrojejunostomy with patent anastomosis. No bowel wall thickening, distention, or surrounding inflammatory changes. Mild sigmoid diverticulosis. Moderate colonic stool burden. Prior appendectomy. Vascular/Lymphatic: Aortic atherosclerosis. No enlarged abdominal or pelvic lymph nodes. Reproductive: Status post hysterectomy. No adnexal masses. Other: No free fluid or pneumoperitoneum. Postsurgical changes along the anterior abdominal wall. Musculoskeletal: Prior right total hip arthroplasty. No acute or significant osseous findings. IMPRESSION: 1. Left upper lobe bronchopneumonia. 2.  No acute intra-abdominal process. 3. Diffuse hepatic steatosis. 4.  Prominent stool throughout the colon favors constipation. 5.  Aortic atherosclerosis (ICD10-I70.0). Electronically Signed   By: WTitus DubinM.D.   On: 11/02/2017 13:48     CBC Recent Labs  Lab 11/02/17 1115 11/03/17 0704 11/04/17 0400  WBC 14.7* 18.2* 10.0  HGB 12.5 10.4* 9.9*  HCT 37.0 30.8* 29.9*  PLT 313 263 269  MCV 88.9 90.6 91.4  MCH 30.0 30.6 30.3  MCHC 33.8 33.8 33.1  RDW 14.5 14.7 14.8  LYMPHSABS 1.2 1.9  --   MONOABS 1.2* 1.1*  --   EOSABS 0.1 0.7  --   BASOSABS 0.0 0.1  --     Chemistries  Recent Labs  Lab 11/02/17 1115 11/03/17 0704 11/04/17 0400  NA 140 142 143  K 5.1 4.0 4.1  CL 108 112* 112*  CO2 21* 21* 24  GLUCOSE 87 152* 94  BUN 24* 20 17  CREATININE 1.30* 1.12* 1.31*  CALCIUM 8.8* 7.7* 7.8*  AST 60* 38  --   ALT 47 37  --   ALKPHOS 109 90  --   BILITOT 0.7 0.6  --     ------------------------------------------------------------------------------------------------------------------ No results for input(s): CHOL, HDL, LDLCALC, TRIG, CHOLHDL, LDLDIRECT in the last 72 hours.  Lab Results  Component Value Date   HGBA1C 6.8 (H) 11/03/2017   ------------------------------------------------------------------------------------------------------------------ No results for input(s): TSH,  T4TOTAL, T3FREE, THYROIDAB in the last 72 hours.  Invalid input(s): FREET3 ------------------------------------------------------------------------------------------------------------------ No results for input(s): VITAMINB12, FOLATE, FERRITIN, TIBC, IRON, RETICCTPCT in the last 72 hours.  Coagulation profile No results for input(s): INR, PROTIME in the last 168 hours.  No results for input(s): DDIMER in the last 72 hours.  Cardiac Enzymes Recent Labs  Lab 11/02/17 1115  TROPONINI <0.03   ------------------------------------------------------------------------------------------------------------------    Component Value Date/Time   BNP 41.6 11/02/2017 1115     Saadia Dewitt M.D on 11/04/2017 at 5:44 PM  Between 7am to 7pm - Pager - [phone]  After 7pm go to [email or web address] - password [email or web address]  [facility] -  Office  [phone]   Voice Recognition Viviann Spare dictation system was used to create this note, attempts have been made to correct errors. Please contact the author with questions and/or clarifications.

## 2017-11-04 NOTE — Progress Notes (Signed)
Hypoglycemic Event  CBG: 58  Treatment: D50 IV 25 mL  Symptoms: None  Follow-up CBG: Time: 0852 CBG Result:132  Possible Reasons for Event: Unknown  Comments/MD notified:    Stacey Drain

## 2017-11-05 LAB — BASIC METABOLIC PANEL
Anion gap: 5 (ref 5–15)
BUN: 12 mg/dL (ref 6–20)
CHLORIDE: 113 mmol/L — AB (ref 101–111)
CO2: 23 mmol/L (ref 22–32)
Calcium: 7.6 mg/dL — ABNORMAL LOW (ref 8.9–10.3)
Creatinine, Ser: 0.99 mg/dL (ref 0.44–1.00)
GFR calc Af Amer: >60 mL/min (ref >60)
GFR calc non Af Amer: 59 mL/min — ABNORMAL LOW (ref >60)
GLUCOSE: 129 mg/dL — AB (ref 65–99)
Potassium: 4.4 mmol/L (ref 3.5–5.1)
SODIUM: 141 mmol/L (ref 135–145)

## 2017-11-05 LAB — GLUCOSE, CAPILLARY
GLUCOSE-CAPILLARY: 119 mg/dL — AB (ref 65–99)
GLUCOSE-CAPILLARY: 53 mg/dL — AB (ref 65–99)
GLUCOSE-CAPILLARY: 74 mg/dL (ref 65–99)
GLUCOSE-CAPILLARY: 280 mg/dL — AB (ref 65–99)

## 2017-11-05 MED ORDER — GLUCOSE 40 % PO GEL
ORAL | Status: AC
  Filled 2017-11-05: qty 1

## 2017-11-05 MED ORDER — ALBUTEROL SULFATE (2.5 MG/3ML) 0.083% IN NEBU: 2.5000 mg | INHALATION_SOLUTION | Freq: Four times a day (QID) | RESPIRATORY_TRACT | 12 refills | Status: AC | PRN

## 2017-11-05 MED ORDER — PREDNISONE 20 MG PO TABS: 20.0000 mg | ORAL_TABLET | Freq: Every day | ORAL | 0 refills | Status: DC

## 2017-11-05 MED ORDER — AZITHROMYCIN 500 MG PO TABS: 500.0000 mg | ORAL_TABLET | Freq: Every day | ORAL | 0 refills | Status: AC

## 2017-11-05 MED ORDER — ALBUTEROL SULFATE HFA 108 (90 BASE) MCG/ACT IN AERS: 2.0000 | INHALATION_SPRAY | Freq: Four times a day (QID) | RESPIRATORY_TRACT | 2 refills | Status: AC | PRN

## 2017-11-05 MED ORDER — HEPARIN SOD (PORK) LOCK FLUSH 100 UNIT/ML IV SOLN
500.0000 [IU] | INTRAVENOUS | Status: AC | PRN
  Administered 2017-11-05: 500 [IU]

## 2017-11-05 MED ORDER — CEFDINIR 300 MG PO CAPS: 300.0000 mg | ORAL_CAPSULE | Freq: Two times a day (BID) | ORAL | 0 refills | Status: AC

## 2017-11-05 MED ORDER — GUAIFENESIN ER 600 MG PO TB12: 600.0000 mg | ORAL_TABLET | Freq: Two times a day (BID) | ORAL | 0 refills | Status: AC

## 2017-11-05 NOTE — Progress Notes (Signed)
Pt had hypoglycemic event, CBG 38. One tube of oral dextrose gel given. 77mn after administration CBG was rechecked and was found to be 47. Second tube of oral dextrose gel given. CBG 248m later was found to be 106. NP on call notified. Will continue to monitor pt.

## 2017-11-05 NOTE — Discharge Summary (Signed)
[name], is a 67 y.o. female  DOB May 27, 1951  MRN [id number].  Admission date:  11/02/2017  Admitting Physician  A Melven Sartorius., MD  Discharge Date:  11/05/2017   Primary MD  Ann Held, DO  Recommendations for primary care physician for things to follow:   1) avoid smoke exposure 2) take medications including antibiotics-  Azithromycin 500 mg daily for 5 days  and Omnicef 300 mg bid x 7 days  3) take prednisone as prescribed with food 4) follow-up with your regular doctor in 1-2 weeks for recheck   Admission Diagnosis  Sepsis, due to unspecified organism ([place]) [A41.9] Community acquired pneumonia of left lower lobe of lung ([place]) [J18.1]   Discharge Diagnosis  Sepsis, due to unspecified organism ([place]) [A41.9] Community acquired pneumonia of left lower lobe of lung ([place]) [J18.1]    Principal Problem:   Sepsis ([place]) Active Problems:   CHRONIC RESPIRATORY FAILURE   GERD (gastroesophageal reflux disease)   Diabetes mellitus with circulatory complication, without long-term current use of insulin ([place])   CKD (chronic kidney disease), stage III ([place])   AKI (acute kidney injury) ([place])   Pneumonia   Leucocytosis      Past Medical History:  Diagnosis Date  . Asthma   . Degenerative joint disease   . Diabetes mellitus   . Dyspnea   . GERD (gastroesophageal reflux disease)   . Hyperlipidemia    no longer on medication for this  . Hypertension    patient denies  . Neuropathy    bilateral hands and feet  . Sickle cell trait ([place])   . Sleep apnea   . stomach ca dx'd 2010   chemo/xrt comp 05/2009  . TIA (transient ischemic attack) 07/2015    Past Surgical History:  Procedure Laterality Date  . APPENDECTOMY    . CESAREAN SECTION    . COLON SURGERY     colonscopy  . COLONOSCOPY WITH PROPOFOL N/A 06/01/2016   Procedure: COLONOSCOPY WITH PROPOFOL;  Surgeon: Wilford Corner, MD;  Location: [facility]COPY;  Service: Endoscopy;  Laterality: N/A;  . ESOPHAGOGASTRODUODENOSCOPY N/A 10/15/2012   Procedure: ESOPHAGOGASTRODUODENOSCOPY (EGD);  Surgeon: Lear Ng, MD;  Location: [facility] ENDOSCOPY;  Service: Endoscopy;  Laterality: N/A;  . ESOPHAGOGASTRODUODENOSCOPY N/A 01/15/2016   Procedure: ESOPHAGOGASTRODUODENOSCOPY (EGD);  Surgeon: Ronald Lobo, MD;  Location: [facility] ENDOSCOPY;  Service: Endoscopy;  Laterality: N/A;  . HERNIA REPAIR    . SHOULDER SURGERY     Left  . TOTAL HIP ARTHROPLASTY     Right  . TOTAL KNEE ARTHROPLASTY     Left       HPI  from the history and physical done on the day of admission:    Chief Complaint: Shortness of Breath  HPI: [name] is a 67 y.o. female with medical history significant of stomach ca s/p chemo/xrt in 2009, DM, HTN, TIA presenting to ED with malaise and abdominal discomfort. Found to have fever, tachycardia, elevated lactate. Pneumonia on CT. Started on broad  spectrum abx. S/p 3 L NS bolus. Patient has no sick contacts. Denied any cold symptoms prior to onset. She's had nonproductive cough for 2 days prior to now. She has some white sputum at the moment. She reported his chest wall pain at 5 out of 10 with the symptoms. She has some shortness of breath which has getting better now   ED Course: patient was seen and evaluated in the ER. Lactic acid was found to be 2.86, ABG is within normal potassium of 5.1 and 1.30 which is her baseline, white count is 14.7with normal differentials, Temperature was 102.3 with a rate of 108 and blood pressure 150 461. CT chest shows left upper lobe bronchopneumonia    Hospital Course:    #1 Sepsis:Secondary to pneumonia.  MRSA PCR negative,  Clinically much improved, stopped vancomycin, stopped cefepime,   patient was also treated with Rocephin and azithromycin , leukocytosis is resolved, afebrile,  With discharge home on Omnicef and azithromycin  #2 Pneumonia:  clinically much improved, shortness of breath at rest, cough is improving shortness of breath is better, cough persists, most likely community-acquired pneumonia  antibiotics as above #1, continue bronchodilatators mucolytics .  Please see 1. Above  #3 chronic kidney disease stage BUL:AGTXMIW to be at baseline.   #4 diabetes: Okay to continue home regimen  #5 GERD:Continue PPIs  #6 history of stomach ca s/p chemo/xrt :Patient is following up with oncologist Dr Martha Clan as needed. Last visit was last year.    Code Status : full  Discharge Condition: stable  Follow UP- oncologist Dr. Martha Clan   Diet and Activity recommendation:  As advised  Discharge Instructions     Discharge Instructions    Call MD for:  difficulty breathing, headache or visual disturbances   Complete by:  As directed    Call MD for:  persistant dizziness or light-headedness   Complete by:  As directed    Call MD for:  persistant nausea and vomiting   Complete by:  As directed    Call MD for:  severe uncontrolled pain   Complete by:  As directed    Call MD for:  temperature >100.4   Complete by:  As directed    Diet - low sodium heart healthy   Complete by:  As directed    Diet Carb Modified   Complete by:  As directed    Discharge instructions   Complete by:  As directed    1) avoid smoke exposure 2) take medications including antibiotics-  Azithromycin 500 mg daily for 5 days  and Omnicef 300 mg bid x 7 days  3) take prednisone as prescribed with food 4) follow-up with your regular doctor in 1-2 weeks for recheck   Increase activity slowly   Complete by:  As directed         Discharge Medications     Allergies as of 11/05/2017      Reactions   Adhesive [tape] Other (See Comments)   Burn Skin   Codeine Other (See Comments)   paranoid   Zocor [simvastatin - High Dose] Other (See Comments)   Muscle spasms   Penicillins Rash   Has patient had a PCN reaction causing immediate rash,  facial/tongue/throat swelling, SOB or lightheadedness with hypotension: Yes Has patient had a PCN reaction causing severe rash involving mucus membranes or skin necrosis: No Has patient had a PCN reaction that required hospitalization does not remember.  Has patient had a PCN reaction occurring within the last  10 years: No If all of the above answers are "NO", then may proceed with Cephalosporin use.      Medication List    STOP taking these medications   clindamycin 300 MG capsule Commonly known as:  CLEOCIN   doxycycline 100 MG capsule Commonly known as:  VIBRAMYCIN     TAKE these medications   albuterol (2.5 MG/3ML) 0.083% nebulizer solution Commonly known as:  PROVENTIL Take 3 mLs (2.5 mg total) by nebulization every 6 (six) hours as needed for wheezing or shortness of breath.   albuterol 108 (90 Base) MCG/ACT inhaler Commonly known as:  PROVENTIL HFA;VENTOLIN HFA Inhale 2 puffs into the lungs every 6 (six) hours as needed for wheezing or shortness of breath.   aspirin 81 MG tablet Take 81 mg by mouth at bedtime.   azithromycin 500 MG tablet Commonly known as:  ZITHROMAX Take 1 tablet (500 mg total) by mouth daily for 5 days. What changed:    medication strength  how much to take   B-D ASSURE BPM/MANUAL ARM CUFF Misc Check blood pressure daily.   cefdinir 300 MG capsule Commonly known as:  OMNICEF Take 1 capsule (300 mg total) by mouth 2 (two) times daily for 7 days.   cyclobenzaprine 5 MG tablet Commonly known as:  FLEXERIL Take 1 tablet (5 mg total) by mouth 3 (three) times daily as needed for muscle spasms.   DULoxetine 60 MG capsule Commonly known as:  CYMBALTA Take 1 capsule (60 mg total) by mouth 2 (two) times daily.   EMBEDA 50-2 MG Cpcr Generic drug:  Morphine-Naltrexone Take 1 capsule by mouth daily.   gabapentin 400 MG capsule Commonly known as:  NEURONTIN Take 2 capsules (800 mg total) by mouth 3 (three) times daily.   glimepiride 4 MG  tablet Commonly known as:  AMARYL Take 1 tablet (4 mg total) by mouth 2 (two) times daily.   guaiFENesin 600 MG 12 hr tablet Commonly known as:  MUCINEX Take 1 tablet (600 mg total) by mouth 2 (two) times daily for 10 days.   ipratropium-albuterol 0.5-2.5 (3) MG/3ML Soln Commonly known as:  DUONEB Take 3 mLs by nebulization every 6 (six) hours as needed.   metFORMIN 500 MG tablet Commonly known as:  GLUCOPHAGE Take 1 tablet (500 mg total) by mouth 2 (two) times daily with a meal.   multivitamin with minerals Tabs tablet Take 1 tablet by mouth daily.   omeprazole 20 MG capsule Commonly known as:  PRILOSEC Take 1 capsule (20 mg total) by mouth daily.   ONE TOUCH ULTRA 2 w/Device Kit   ONE TOUCH ULTRA TEST test strip Generic drug:  glucose blood   OT ULTRA/FASTTK CNTRL SOLN Soln   predniSONE 20 MG tablet Commonly known as:  DELTASONE Take 1 tablet (20 mg total) by mouth daily with breakfast.   sucralfate 1 GM/10ML suspension Commonly known as:  CARAFATE Take 10 mLs (1 g total) by mouth 4 (four) times daily -  with meals and at bedtime.   traMADol 50 MG tablet Commonly known as:  ULTRAM Take 1 tablet (50 mg total) by mouth 4 (four) times daily. What changed:    when to take this  reasons to take this       Major procedures and Radiology Reports - PLEASE review detailed and final reports for all details, in brief -   Ct Chest W Contrast  Result Date: 11/02/2017 CLINICAL DATA:  Shortness of breath and abdominal pain. History of gastric cancer.  EXAM: CT CHEST, ABDOMEN, AND PELVIS WITH CONTRAST TECHNIQUE: Multidetector CT imaging of the chest, abdomen and pelvis was performed following the standard protocol during bolus administration of intravenous contrast. CONTRAST:  151m ISOVUE-300 IOPAMIDOL (ISOVUE-300) INJECTION 61% COMPARISON:  CT abdomen pelvis dated Dec 17, 2016. CT chest dated Dec 21, 2011. FINDINGS: CT CHEST FINDINGS Cardiovascular: Normal heart size. No  pericardial effusion. Normal caliber thoracic aorta. Coronary, aortic arch, and branch vessel atherosclerotic vascular disease. Right chest wall port catheter with the tip in the distal SVC. Mediastinum/Nodes: No enlarged mediastinal, hilar, or axillary lymph nodes. There is a 1.3 cm hypodense nodule within the right thyroid lobe with small surrounding punctate calcifications. The trachea and esophagus demonstrate no significant abnormalities. Small hiatal hernia, unchanged. Lungs/Pleura: Patchy consolidation within the left upper lobe. No pleural effusion or pneumothorax. No suspicious pulmonary nodule. Musculoskeletal: No chest wall abnormality. No acute or significant osseous findings. CT ABDOMEN PELVIS FINDINGS Hepatobiliary: Diffuse hepatic steatosis. Mild gallbladder distention without wall thickening or gallstones. No biliary dilatation. Pancreas: Atrophic. No pancreatic ductal dilatation or surrounding inflammatory changes. Spleen: Normal in size without focal abnormality. Adrenals/Urinary Tract: Adrenal glands are unremarkable. Kidneys are normal, without renal calculi, focal lesion, or hydronephrosis. Bladder is unremarkable. Stomach/Bowel: Postsurgical changes related to prior distal antrectomy and gastrojejunostomy with patent anastomosis. No bowel wall thickening, distention, or surrounding inflammatory changes. Mild sigmoid diverticulosis. Moderate colonic stool burden. Prior appendectomy. Vascular/Lymphatic: Aortic atherosclerosis. No enlarged abdominal or pelvic lymph nodes. Reproductive: Status post hysterectomy. No adnexal masses. Other: No free fluid or pneumoperitoneum. Postsurgical changes along the anterior abdominal wall. Musculoskeletal: Prior right total hip arthroplasty. No acute or significant osseous findings. IMPRESSION: 1. Left upper lobe bronchopneumonia. 2.  No acute intra-abdominal process. 3. Diffuse hepatic steatosis. 4.  Prominent stool throughout the colon favors constipation. 5.   Aortic atherosclerosis (ICD10-I70.0). Electronically Signed   By: WTitus DubinM.D.   On: 11/02/2017 13:48   Ct Abdomen Pelvis W Contrast  Result Date: 11/02/2017 CLINICAL DATA:  Shortness of breath and abdominal pain. History of gastric cancer. EXAM: CT CHEST, ABDOMEN, AND PELVIS WITH CONTRAST TECHNIQUE: Multidetector CT imaging of the chest, abdomen and pelvis was performed following the standard protocol during bolus administration of intravenous contrast. CONTRAST:  1021mISOVUE-300 IOPAMIDOL (ISOVUE-300) INJECTION 61% COMPARISON:  CT abdomen pelvis dated Dec 17, 2016. CT chest dated Dec 21, 2011. FINDINGS: CT CHEST FINDINGS Cardiovascular: Normal heart size. No pericardial effusion. Normal caliber thoracic aorta. Coronary, aortic arch, and branch vessel atherosclerotic vascular disease. Right chest wall port catheter with the tip in the distal SVC. Mediastinum/Nodes: No enlarged mediastinal, hilar, or axillary lymph nodes. There is a 1.3 cm hypodense nodule within the right thyroid lobe with small surrounding punctate calcifications. The trachea and esophagus demonstrate no significant abnormalities. Small hiatal hernia, unchanged. Lungs/Pleura: Patchy consolidation within the left upper lobe. No pleural effusion or pneumothorax. No suspicious pulmonary nodule. Musculoskeletal: No chest wall abnormality. No acute or significant osseous findings. CT ABDOMEN PELVIS FINDINGS Hepatobiliary: Diffuse hepatic steatosis. Mild gallbladder distention without wall thickening or gallstones. No biliary dilatation. Pancreas: Atrophic. No pancreatic ductal dilatation or surrounding inflammatory changes. Spleen: Normal in size without focal abnormality. Adrenals/Urinary Tract: Adrenal glands are unremarkable. Kidneys are normal, without renal calculi, focal lesion, or hydronephrosis. Bladder is unremarkable. Stomach/Bowel: Postsurgical changes related to prior distal antrectomy and gastrojejunostomy with patent  anastomosis. No bowel wall thickening, distention, or surrounding inflammatory changes. Mild sigmoid diverticulosis. Moderate colonic stool burden. Prior appendectomy. Vascular/Lymphatic: Aortic atherosclerosis. No enlarged  abdominal or pelvic lymph nodes. Reproductive: Status post hysterectomy. No adnexal masses. Other: No free fluid or pneumoperitoneum. Postsurgical changes along the anterior abdominal wall. Musculoskeletal: Prior right total hip arthroplasty. No acute or significant osseous findings. IMPRESSION: 1. Left upper lobe bronchopneumonia. 2.  No acute intra-abdominal process. 3. Diffuse hepatic steatosis. 4.  Prominent stool throughout the colon favors constipation. 5.  Aortic atherosclerosis (ICD10-I70.0). Electronically Signed   By: Titus Dubin M.D.   On: 11/02/2017 13:48    Micro Results    Recent Results (from the past 240 hour(s))  Blood Culture (routine x 2)     Status: None (Preliminary result)   Collection Time: 11/02/17 11:15 AM  Result Value Ref Range Status   Specimen Description BLOOD LEFT ANTECUBITAL  Final   Special Requests   Final    BOTTLES DRAWN AEROBIC AND ANAEROBIC Blood Culture adequate volume   Culture   Final    NO GROWTH 3 DAYS Performed at [facility], [address], [place], [place] [address]    Report Status PENDING  Incomplete  Blood Culture (routine x 2)     Status: None (Preliminary result)   Collection Time: 11/02/17 11:55 AM  Result Value Ref Range Status   Specimen Description BLOOD RIGHT ANTECUBITAL  Final   Special Requests   Final    BOTTLES DRAWN AEROBIC AND ANAEROBIC Blood Culture results may not be optimal due to an inadequate volume of blood received in culture bottles   Culture   Final    NO GROWTH 3 DAYS Performed at [facility], [place] [address], [place], [place] [address]    Report Status PENDING  Incomplete  Urine culture     Status: Abnormal   Collection Time: 11/02/17 12:22 PM  Result Value Ref Range Status    Specimen Description   Final    URINE, CATHETERIZED Performed at [facility], [address] [place], [place] [address]    Special Requests   Final    NONE Performed at [facility], [address] [place], [place] [address]    Culture MULTIPLE SPECIES PRESENT, SUGGEST RECOLLECTION (A)  Final   Report Status 11/03/2017 FINAL  Final  MRSA PCR Screening     Status: None   Collection Time: 11/02/17  6:42 PM  Result Value Ref Range Status   MRSA by PCR NEGATIVE NEGATIVE Final    Comment:        The GeneXpert MRSA Assay (FDA approved for NASAL specimens only), is one component of a comprehensive MRSA colonization surveillance program. It is not intended to diagnose MRSA infection nor to guide or monitor treatment for MRSA infections. Performed at [facility], [place] [address], [place], [place] [address]        Today   Subjective    [name] today has no new complaints,  No fever, shortness of breath is resolved at rest, cough is better          Patient has been seen and examined prior to discharge   Objective   Blood pressure 120/86, pulse 92, temperature 98.2 F (36.8 C), temperature source Oral, resp. rate 16, height 5' 7" (1.702 m), weight 98.9 kg (218 lb 0.6 oz), SpO2 93 %.   Intake/Output Summary (Last 24 hours) at 11/05/2017 1321 Last data filed at 11/05/2017 0600 Gross per 24 hour  Intake 2740 ml  Output 1550 ml  Net 1190 ml    Exam  Gen:- Awake Alert, able to speak in sentences HEENT:- [place].AT, No sclera icterus Neck-Supple Neck,No JVD,.  Lungs- improved air movement, no wheezing no rales CV- S1, S2 normal, right chest Port-A-Cath site is clean dry and intact Abd-  +ve B.Sounds, Abd Soft, No tenderness,    Extremity/Skin:- trace edema,   negative Homans Psych-affect is appropriate, oriented x3 Neuro-no new focal deficits, no tremors     Data Review   CBC w Diff:  Lab Results  Component Value Date   WBC  10.0 11/04/2017   HGB 9.9 (L) 11/04/2017   HGB 12.5 07/06/2017   HGB 13.0 11/14/2013   HCT 29.9 (L) 11/04/2017   HCT 37.2 07/06/2017   HCT 39.6 11/14/2013   PLT 269 11/04/2017   PLT 312 07/06/2017   PLT 360 11/14/2013   LYMPHOPCT 10 11/03/2017   LYMPHOPCT 32.1 07/06/2017   LYMPHOPCT 35.6 11/14/2013   MONOPCT 6 11/03/2017   MONOPCT 8.3 07/06/2017   MONOPCT 9.8 11/14/2013   EOSPCT 4 11/03/2017   EOSPCT 6.2 07/06/2017   EOSPCT 6.7 11/14/2013   BASOPCT 0 11/03/2017   BASOPCT 0.2 07/06/2017   BASOPCT 0.6 11/14/2013    CMP:  Lab Results  Component Value Date   NA 141 11/05/2017   NA 138 07/06/2017   K 4.4 11/05/2017   K 4.4 07/06/2017   CL 113 (H) 11/05/2017   CL 101 08/02/2016   CO2 23 11/05/2017   CO2 22 07/06/2017   BUN 12 11/05/2017   BUN 19.7 07/06/2017   CREATININE 0.99 11/05/2017   CREATININE 1.3 (H) 07/06/2017   PROT 5.0 (L) 11/03/2017   PROT 7.4 07/06/2017   ALBUMIN 2.1 (L) 11/03/2017   ALBUMIN 3.1 (L) 07/06/2017   BILITOT 0.6 11/03/2017   BILITOT 0.30 07/06/2017   ALKPHOS 90 11/03/2017   ALKPHOS 115 07/06/2017   AST 38 11/03/2017   AST 18 07/06/2017   ALT 37 11/03/2017   ALT 14 07/06/2017  .   Total Discharge time is about 33 minutes  Roxan Hockey M.D on 11/05/2017 at 1:21 PM  [facility]   Office  [phone]  Voice Recognition Viviann Spare dictation system was used to create this note, attempts have been made to correct errors. Please contact the author with questions and/or clarifications.

## 2017-11-05 NOTE — Progress Notes (Signed)
Hypoglycemic Event  CBG: 57  Treatment: 15 GM carbohydrate snack  Symptoms: None  Follow-up CBG: [id number] CBG Result:74  Possible Reasons for Event: Unknown  Comments/MD notified:yes    Roe Rutherford

## 2017-11-05 NOTE — Discharge Instructions (Signed)
1) avoid smoke exposure 2) take medications including antibiotics-  Azithromycin 500 mg daily for 5 days  and Omnicef 300 mg bid x 7 days  3) take prednisone as prescribed with food 4) follow-up with your regular doctor in 1-2 weeks for recheck

## 2017-11-06 ENCOUNTER — Telehealth: Payer: Self-pay

## 2017-11-06 NOTE — Telephone Encounter (Signed)
Attempted to reach pt to complete TCM and schedule hospital f/u, no VM available.

## 2017-11-07 ENCOUNTER — Encounter (HOSPITAL_COMMUNITY): Payer: Self-pay | Admitting: Emergency Medicine

## 2017-11-07 ENCOUNTER — Emergency Department (HOSPITAL_COMMUNITY)
Admission: EM | Admit: 2017-11-07 | Discharge: 2017-11-07 | Disposition: A | Discharge status: Home / Self Care | Payer: Medicare HMO | Attending: Emergency Medicine | Admitting: Emergency Medicine

## 2017-11-07 ENCOUNTER — Telehealth: Payer: Self-pay | Admitting: *Deleted

## 2017-11-07 DIAGNOSIS — R1012 Left upper quadrant pain: Secondary | ICD-10-CM

## 2017-11-07 DIAGNOSIS — Z96653 Presence of artificial knee joint, bilateral: Secondary | ICD-10-CM | POA: Diagnosis not present

## 2017-11-07 DIAGNOSIS — Z79899 Other long term (current) drug therapy: Secondary | ICD-10-CM | POA: Insufficient documentation

## 2017-11-07 DIAGNOSIS — J45909 Unspecified asthma, uncomplicated: Secondary | ICD-10-CM | POA: Insufficient documentation

## 2017-11-07 DIAGNOSIS — R197 Diarrhea, unspecified: Secondary | ICD-10-CM | POA: Diagnosis not present

## 2017-11-07 DIAGNOSIS — Z7982 Long term (current) use of aspirin: Secondary | ICD-10-CM | POA: Diagnosis not present

## 2017-11-07 DIAGNOSIS — E1122 Type 2 diabetes mellitus with diabetic chronic kidney disease: Secondary | ICD-10-CM | POA: Insufficient documentation

## 2017-11-07 DIAGNOSIS — I129 Hypertensive chronic kidney disease with stage 1 through stage 4 chronic kidney disease, or unspecified chronic kidney disease: Secondary | ICD-10-CM | POA: Diagnosis not present

## 2017-11-07 DIAGNOSIS — Z7984 Long term (current) use of oral hypoglycemic drugs: Secondary | ICD-10-CM | POA: Diagnosis not present

## 2017-11-07 DIAGNOSIS — N183 Chronic kidney disease, stage 3 (moderate): Secondary | ICD-10-CM | POA: Insufficient documentation

## 2017-11-07 DIAGNOSIS — E119 Type 2 diabetes mellitus without complications: Secondary | ICD-10-CM | POA: Insufficient documentation

## 2017-11-07 DIAGNOSIS — D002 Carcinoma in situ of stomach: Secondary | ICD-10-CM | POA: Insufficient documentation

## 2017-11-07 DIAGNOSIS — Z87891 Personal history of nicotine dependence: Secondary | ICD-10-CM | POA: Diagnosis not present

## 2017-11-07 LAB — LIPASE, BLOOD: LIPASE: 16 U/L (ref 11–51)

## 2017-11-07 LAB — COMPREHENSIVE METABOLIC PANEL
ALBUMIN: 2.5 g/dL — AB (ref 3.5–5.0)
ALT: 32 U/L (ref 14–54)
ANION GAP: 12 (ref 5–15)
AST: 32 U/L (ref 15–41)
Alkaline Phosphatase: 100 U/L (ref 38–126)
BUN: 8 mg/dL (ref 6–20)
CHLORIDE: 108 mmol/L (ref 101–111)
CO2: 24 mmol/L (ref 22–32)
Calcium: 8.9 mg/dL (ref 8.9–10.3)
Creatinine, Ser: 1.03 mg/dL — ABNORMAL HIGH (ref 0.44–1.00)
GFR calc Af Amer: >60 mL/min (ref >60)
GFR calc non Af Amer: 56 mL/min — ABNORMAL LOW (ref >60)
GLUCOSE: 113 mg/dL — AB (ref 65–99)
POTASSIUM: 3.8 mmol/L (ref 3.5–5.1)
SODIUM: 144 mmol/L (ref 135–145)
Total Bilirubin: 0.9 mg/dL (ref 0.3–1.2)
Total Protein: 6.4 g/dL — ABNORMAL LOW (ref 6.5–8.1)

## 2017-11-07 LAB — CULTURE, BLOOD (ROUTINE X 2)
CULTURE: NO GROWTH
Culture: NO GROWTH
Special Requests: ADEQUATE

## 2017-11-07 LAB — CBC
HCT: 35.4 % — ABNORMAL LOW (ref 36.0–46.0)
HEMOGLOBIN: 12 g/dL (ref 12.0–15.0)
MCH: 30.5 pg (ref 26.0–34.0)
MCHC: 33.9 g/dL (ref 30.0–36.0)
MCV: 90.1 fL (ref 78.0–100.0)
Platelets: 330 10*3/uL (ref 150–400)
RBC: 3.93 MIL/uL (ref 3.87–5.11)
RDW: 14.5 % (ref 11.5–15.5)
WBC: 9.8 10*3/uL (ref 4.0–10.5)

## 2017-11-07 LAB — POC OCCULT BLOOD, ED: Fecal Occult Bld: POSITIVE — AB

## 2017-11-07 MED ORDER — FAMOTIDINE 20 MG PO TABS
20.0000 mg | ORAL_TABLET | Freq: Once | ORAL | Status: AC
  Administered 2017-11-07: 20 mg via ORAL
  Filled 2017-11-07: qty 1

## 2017-11-07 MED ORDER — FAMOTIDINE 20 MG PO TABS: 20.0000 mg | ORAL_TABLET | Freq: Two times a day (BID) | ORAL | 0 refills | Status: DC

## 2017-11-07 MED ORDER — PANTOPRAZOLE SODIUM 40 MG PO TBEC: 40.0000 mg | DELAYED_RELEASE_TABLET | Freq: Every day | ORAL | 0 refills | Status: DC

## 2017-11-07 MED ORDER — ACETAMINOPHEN 325 MG PO TABS
650.0000 mg | ORAL_TABLET | Freq: Once | ORAL | Status: AC
  Administered 2017-11-07: 650 mg via ORAL
  Filled 2017-11-07: qty 2

## 2017-11-07 NOTE — ED Notes (Signed)
Pt stated that he was diagnosed with pneumonia and was released from the hospital on 4/11. She stated that since that time she has not had a appetite and has had diarrhea. Still currently on antibiotics for pneumonia.

## 2017-11-07 NOTE — ED Notes (Signed)
Pt urgently needed to have bowl movement and is in a wheelchair in lobby. Pt was taken to bathroom with 1 person assist. Pt due to urgency was unable to obtain urine sample, will try again later.

## 2017-11-07 NOTE — ED Provider Notes (Signed)
[facility] DEPT Provider Note   CSN: [id number] Arrival date & time: 11/07/17  1737     History   Chief Complaint Chief Complaint  Patient presents with  . Nausea  . Abdominal Pain    HPI [name] is a 67 y.o. female.  HPI  67 year old female presents with a chief complaint of left-sided abdominal pain.  She has been having this pain since being diagnosed with pneumonia about 5 days ago.  She states that she was discharged 2 days ago and has been on antibiotics.  She has noticed diarrhea for the last 2 days including about 3 or 4 stools per day.  She describes them as having stool but with some red in the stool.  She states the abdominal pain is stable and the same as when she was first diagnosed when she had a CT chest/abdomen/pelvis.  It is an aching sensation that is an 8/10.  She has not taken anything for the pain.  No vomiting but has felt on and off nausea.  She has been drinking fluids but states she has no appetite and is not eating food.  When she was in the hospital, Carafate was helpful and made her feel better.  She has tried this at home with some partial relief.  Past Medical History:  Diagnosis Date  . Asthma   . Degenerative joint disease   . Diabetes mellitus   . Dyspnea   . GERD (gastroesophageal reflux disease)   . Hyperlipidemia    no longer on medication for this  . Hypertension    patient denies  . Neuropathy    bilateral hands and feet  . Sickle cell trait ([place])   . Sleep apnea   . stomach ca dx'd 2010   chemo/xrt comp 05/2009  . TIA (transient ischemic attack) 07/2015    Patient Active Problem List   Diagnosis Date Noted  . Pneumonia 11/02/2017  . Sepsis ([place]) 11/02/2017  . Leucocytosis 11/02/2017  . Moderate persistent asthma with acute exacerbation 05/26/2017  . Influenza A 05/26/2017  . Acute pain of right shoulder 03/07/2017  . Acute bilateral low back pain without sciatica 03/07/2017  . Chronic pain  03/07/2017  . Fatty liver 03/24/2016  . Weakness 03/23/2016  . Unexplained weight loss 03/23/2016  . Melena 03/23/2016  . Hypotension 02/03/2016  . Syncope 02/03/2016  . Hypomagnesemia 02/03/2016  . AKI (acute kidney injury) ([place]) 02/03/2016  . Gastrointestinal dysmotility 01/20/2016  . CKD (chronic kidney disease), stage III ([place]) 01/20/2016  . Abscess of right thigh 01/20/2016  . Chronic pain syndrome 01/15/2016  . Diarrhea 01/15/2016  . Generalized abdominal pain 01/14/2016  . Diabetes mellitus with circulatory complication, without long-term current use of insulin ([place]) 12/30/2015  . Morbid obesity due to excess calories ([place]) 05/05/2015  . Diabetic polyneuropathy associated with type 2 diabetes mellitus ([place]) 01/02/2015  . Acute bacterial sinusitis 01/02/2015  . Onychomycosis 11/04/2014  . Pain in lower limb 11/04/2014  . Multinodular goiter (nontoxic) 10/16/2014  . CVA (cerebral infarction) 08/11/2014  . Iron deficiency anemia 05/20/2014  . Major depressive disorder, single episode, moderate ([place]) 05/20/2014  . Dizziness of unknown cause 04/28/2014  . MDD (major depressive disorder), recurrent episode, moderate ([place]) 01/28/2014  . GERD (gastroesophageal reflux disease) 01/02/2014  . Nausea 10/15/2012  . Stomach cancer ([place]) 12/10/2008  . CHRONIC RESPIRATORY FAILURE 01/03/2008  . Hyperlipidemia 06/27/2007  . OBESITY, MORBID 06/27/2007  . SLEEP APNEA, OBSTRUCTIVE 06/27/2007  . Essential hypertension 06/27/2007  .  Asthma 06/27/2007  . OSTEOARTHRITIS  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 06/27/2007    Past Surgical History:  Procedure Laterality Date  . APPENDECTOMY    . CESAREAN SECTION    . COLON SURGERY     colonscopy  . COLONOSCOPY WITH PROPOFOL N/A 06/01/2016   Procedure: COLONOSCOPY WITH PROPOFOL;  Surgeon: Wilford Corner, MD;  Location: [facility] ENDOSCOPY;  Service: Endoscopy;  Laterality: N/A;  . ESOPHAGOGASTRODUODENOSCOPY N/A 10/15/2012   Procedure:  ESOPHAGOGASTRODUODENOSCOPY (EGD);  Surgeon: Lear Ng, MD;  Location: [facility] ENDOSCOPY;  Service: Endoscopy;  Laterality: N/A;  . ESOPHAGOGASTRODUODENOSCOPY N/A 01/15/2016   Procedure: ESOPHAGOGASTRODUODENOSCOPY (EGD);  Surgeon: Ronald Lobo, MD;  Location: [facility] ENDOSCOPY;  Service: Endoscopy;  Laterality: N/A;  . HERNIA REPAIR    . SHOULDER SURGERY     Left  . TOTAL HIP ARTHROPLASTY     Right  . TOTAL KNEE ARTHROPLASTY     Left     OB History    Gravida  4   Para  4   Term      Preterm      AB      Living  4     SAB      TAB      Ectopic      Multiple      Live Births               Home Medications    Prior to Admission medications   Medication Sig Start Date End Date Taking? Authorizing Provider  azithromycin (ZITHROMAX) 500 MG tablet Take 1 tablet (500 mg total) by mouth daily for 5 days. 11/05/17 11/10/17 Yes Emokpae, Courage, MD  cefdinir (OMNICEF) 300 MG capsule Take 1 capsule (300 mg total) by mouth 2 (two) times daily for 7 days. 11/05/17 11/12/17 Yes Emokpae, Courage, MD  DULoxetine (CYMBALTA) 60 MG capsule Take 1 capsule (60 mg total) by mouth 2 (two) times daily. 10/16/17  Yes Roma Schanz R, DO  gabapentin (NEURONTIN) 400 MG capsule Take 2 capsules (800 mg total) by mouth 3 (three) times daily. 08/16/17  Yes Roma Schanz R, DO  glimepiride (AMARYL) 4 MG tablet Take 1 tablet (4 mg total) by mouth 2 (two) times daily. 10/16/17  Yes Ann Held, DO  guaiFENesin (MUCINEX) 600 MG 12 hr tablet Take 1 tablet (600 mg total) by mouth 2 (two) times daily for 10 days. 11/05/17 11/15/17 Yes Emokpae, Courage, MD  albuterol (PROVENTIL HFA;VENTOLIN HFA) 108 (90 Base) MCG/ACT inhaler Inhale 2 puffs into the lungs every 6 (six) hours as needed for wheezing or shortness of breath. 11/05/17   Roxan Hockey, MD  albuterol (PROVENTIL) (2.5 MG/3ML) 0.083% nebulizer solution Take 3 mLs (2.5 mg total) by nebulization every 6 (six) hours as needed for  wheezing or shortness of breath. 11/05/17   Roxan Hockey, MD  aspirin 81 MG tablet Take 81 mg by mouth at bedtime.    [provider]  Blood Glucose Calibration (OT ULTRA/FASTTK CNTRL SOLN) SOLN  02/17/16   [provider]  Blood Glucose Monitoring Suppl (ONE TOUCH ULTRA 2) w/Device KIT  02/17/16   [provider]  Blood Pressure Monitoring (B-D ASSURE BPM/MANUAL ARM CUFF) MISC Check blood pressure daily. 02/18/16   Ann Held, DO  cyclobenzaprine (FLEXERIL) 5 MG tablet Take 1 tablet (5 mg total) by mouth 3 (three) times daily as needed for muscle spasms. 03/07/17   Biagio Borg, MD  EMBEDA 50-2 MG CPCR  Take 1 capsule by mouth daily. 12/26/16   [provider]  famotidine (PEPCID) 20 MG tablet Take 1 tablet (20 mg total) by mouth 2 (two) times daily. 11/07/17   Sherwood Gambler, MD  ipratropium-albuterol (DUONEB) 0.5-2.5 (3) MG/3ML SOLN Take 3 mLs by nebulization every 6 (six) hours as needed. Patient taking differently: Take 3 mLs by nebulization every 6 (six) hours as needed (sob and wheezing).  05/26/17   Ann Held, DO  metFORMIN (GLUCOPHAGE) 500 MG tablet Take 1 tablet (500 mg total) by mouth 2 (two) times daily with a meal. 02/22/17   Philemon Kingdom, MD  Multiple Vitamin (MULTIVITAMIN WITH MINERALS) TABS tablet Take 1 tablet by mouth daily.    [provider]  ONE TOUCH ULTRA TEST test strip  02/17/16   [provider]  pantoprazole (PROTONIX) 40 MG tablet Take 1 tablet (40 mg total) by mouth daily. 11/07/17   Sherwood Gambler, MD  predniSONE (DELTASONE) 20 MG tablet Take 1 tablet (20 mg total) by mouth daily with breakfast. 11/05/17   Roxan Hockey, MD  sucralfate (CARAFATE) 1 GM/10ML suspension Take 10 mLs (1 g total) by mouth 4 (four) times daily -  with meals and at bedtime. 08/21/17   Volanda Napoleon, MD  traMADol (ULTRAM) 50 MG tablet Take 1 tablet (50 mg total) by mouth 4 (four) times daily. Patient taking  differently: Take 50 mg by mouth 3 (three) times daily as needed for moderate pain.  05/26/17   Ann Held, DO    Family History Family History  Problem Relation Age of Onset  . Diabetes Brother   . Alzheimer's disease Father 58  . Cancer Mother 22  . Diabetes Maternal Grandmother   . Cancer Maternal Grandfather        lung  . Suicidality Neg Hx   . Depression Neg Hx   . Dementia Neg Hx   . Anxiety disorder Neg Hx     Social History Social History   Tobacco Use  . Smoking status: Former Smoker    Packs/day: 1.00    Years: 15.00    Pack years: 15.00    Types: Cigarettes    Start date: 09/27/1973    Last attempt to quit: 10/15/1988    Years since quitting: 29.0  . Smokeless tobacco: Never Used  . Tobacco comment: quit smoking 14 years ago  Substance Use Topics  . Alcohol use: No    Alcohol/week: 0.0 oz  . Drug use: No     Allergies   Adhesive [tape]; Codeine; Zocor [simvastatin - high dose]; and Penicillins   Review of Systems Review of Systems  Constitutional: Negative for fever.  Respiratory: Negative for cough and shortness of breath.   Cardiovascular: Negative for chest pain.  Gastrointestinal: Positive for abdominal pain and diarrhea. Negative for nausea and vomiting.  Genitourinary: Negative for dysuria.  All other systems reviewed and are negative.    Physical Exam Updated Vital Signs BP 115/64   Pulse 72   Temp 98.7 F (37.1 C) (Oral)   Resp 18   Ht 5' 7"  (1.702 m)   SpO2 100%   BMI 34.15 kg/m   Physical Exam  Constitutional: She is oriented to person, place, and time. She appears well-developed and well-nourished.  Non-toxic appearance. She does not appear ill. No distress.  obese  HENT:  Head: Normocephalic and atraumatic.  Right Ear: External ear normal.  Left Ear: External ear normal.  Nose: Nose normal.  Eyes: Right  eye exhibits no discharge. Left eye exhibits no discharge.  Cardiovascular: Normal rate, regular rhythm and  normal heart sounds.  Pulmonary/Chest: Effort normal and breath sounds normal.  Abdominal: Soft. There is tenderness in the left upper quadrant.  Genitourinary:  Genitourinary Comments: Light brown stool, no gross blood  Neurological: She is alert and oriented to person, place, and time.  Skin: Skin is warm and dry.  Nursing note and vitals reviewed.    ED Treatments / Results  Labs (all labs ordered are listed, but only abnormal results are displayed) Labs Reviewed  COMPREHENSIVE METABOLIC PANEL - Abnormal; Notable for the following components:      Result Value   Glucose, Bld 113 (*)    Creatinine, Ser 1.03 (*)    Total Protein 6.4 (*)    Albumin 2.5 (*)    GFR calc non Af Amer 56 (*)    All other components within normal limits  CBC - Abnormal; Notable for the following components:   HCT 35.4 (*)    All other components within normal limits  POC OCCULT BLOOD, ED - Abnormal; Notable for the following components:   Fecal Occult Bld POSITIVE (*)    All other components within normal limits  LIPASE, BLOOD  URINALYSIS, ROUTINE W REFLEX MICROSCOPIC    EKG None  Radiology No results found.  Procedures Procedures (including critical care time)  Medications Ordered in ED Medications  acetaminophen (TYLENOL) tablet 650 mg (650 mg Oral Given 11/07/17 2053)  famotidine (PEPCID) tablet 20 mg (20 mg Oral Given 11/07/17 2053)     Initial Impression / Assessment and Plan / ED Course  I have reviewed the triage vital signs and the nursing notes.  Pertinent labs & imaging results that were available during my care of the patient were reviewed by me and considered in my medical decision making (see chart for details).     Presents with the same upper abdominal pain that she had a CT scan for less than a week ago.  Given there is no acute intra-abdominal pathology at that time, I highly doubt that she has developed a new abdominal emergency.  Given the Carafate was very helpful  during the hospitalization, I will treat her with PPI and H2 blocker.  She notes she has been seeing red in her stools but on my exam her stool is light brown with no red or melena.  I offered her options such as treatment with fluids and pain control but she declines any type of IV and wants to go home.  She is not hypoxic, has clear lungs, and appears to be improving from her hospitalization.  This point she appears stable for discharge home.  Return precautions.  Final Clinical Impressions(s) / ED Diagnoses   Final diagnoses:  Abdominal pain, LUQ (left upper quadrant)  Diarrhea, unspecified type    ED Discharge Orders        Ordered    pantoprazole (PROTONIX) 40 MG tablet  Daily     11/07/17 2105    famotidine (PEPCID) 20 MG tablet  2 times daily     11/07/17 2105       Sherwood Gambler, MD 11/08/17 [phone]

## 2017-11-07 NOTE — ED Triage Notes (Signed)
Patient c/o nausea and abdominal pain since d/c from hospital with pneumonia x5 days ago. Denies V/D. Reports taking abx as prescribed. Denies cough. Denies chest pain.

## 2017-11-07 NOTE — Telephone Encounter (Signed)
Patient called and left a voice mail with just her name on the TRIAGE RN line.   Attempted to call patient back. One number had been changed. The second number had no response.

## 2017-11-23 ENCOUNTER — Telehealth: Payer: Self-pay | Admitting: *Deleted

## 2017-11-23 NOTE — Telephone Encounter (Signed)
Pt called back, pt states she is available now, call pt back when possible

## 2017-11-23 NOTE — Telephone Encounter (Signed)
No answer. No vm option.

## 2017-11-23 NOTE — Telephone Encounter (Signed)
Patient called back

## 2017-11-24 NOTE — Telephone Encounter (Signed)
Called pt to schedule hospital follow up appointment within 2 weeks. No answer

## 2017-11-24 NOTE — Telephone Encounter (Signed)
No answer

## 2017-11-27 NOTE — Telephone Encounter (Signed)
Transition Care Management Follow-up Telephone Call   How have you been since you were released from the hospital? Doing good.   Do you understand why you were in the hospital? yes   Do you understand the discharge instructions? yes   Where were you discharged to? Home   Items Reviewed:  Medications reviewed: no  Allergies reviewed: yes  Dietary changes reviewed: yes  Referrals reviewed: yes   Functional Questionnaire:   Activities of Daily Living (ADLs):   She states they are independent in the following: ambulation, bathing and hygiene, feeding, continence, grooming, toileting and dressing States they require assistance with the following: uses walker   Any transportation issues/concerns?: no   Any patient concerns? no   Confirmed importance and date/time of follow-up visits scheduled yes  Provider Appointment booked with Dr.Lowne  Confirmed with patient if condition begins to worsen call PCP or go to the ER.  Patient was given the office number and encouraged to call back with question or concerns.  : yes

## 2017-12-05 ENCOUNTER — Inpatient Hospital Stay: Payer: Self-pay | Admitting: Family Medicine

## 2017-12-05 ENCOUNTER — Ambulatory Visit (HOSPITAL_BASED_OUTPATIENT_CLINIC_OR_DEPARTMENT_OTHER)
Admission: RE | Admit: 2017-12-05 | Discharge: 2017-12-05 | Disposition: A | Discharge status: Home / Self Care | Payer: Medicare HMO | Source: Ambulatory Visit | Attending: Family Medicine | Admitting: Family Medicine

## 2017-12-05 ENCOUNTER — Ambulatory Visit (INDEPENDENT_AMBULATORY_CARE_PROVIDER_SITE_OTHER): Payer: Medicare HMO | Admitting: Family Medicine

## 2017-12-05 ENCOUNTER — Encounter: Payer: Self-pay | Admitting: Family Medicine

## 2017-12-05 VITALS — BP 138/78 | HR 96 | Temp 98.1°F | Resp 16 | Ht 67.0 in | Wt 199.8 lb

## 2017-12-05 DIAGNOSIS — J180 Bronchopneumonia, unspecified organism: Secondary | ICD-10-CM

## 2017-12-05 DIAGNOSIS — Z79899 Other long term (current) drug therapy: Secondary | ICD-10-CM

## 2017-12-05 DIAGNOSIS — J189 Pneumonia, unspecified organism: Secondary | ICD-10-CM | POA: Diagnosis not present

## 2017-12-05 DIAGNOSIS — I10 Essential (primary) hypertension: Secondary | ICD-10-CM

## 2017-12-05 DIAGNOSIS — R109 Unspecified abdominal pain: Secondary | ICD-10-CM

## 2017-12-05 DIAGNOSIS — G894 Chronic pain syndrome: Secondary | ICD-10-CM | POA: Diagnosis not present

## 2017-12-05 DIAGNOSIS — E785 Hyperlipidemia, unspecified: Secondary | ICD-10-CM

## 2017-12-05 DIAGNOSIS — E1159 Type 2 diabetes mellitus with other circulatory complications: Secondary | ICD-10-CM

## 2017-12-05 DIAGNOSIS — N183 Chronic kidney disease, stage 3 unspecified: Secondary | ICD-10-CM

## 2017-12-05 LAB — POC URINALSYSI DIPSTICK (AUTOMATED)
BILIRUBIN UA: NEGATIVE
GLUCOSE UA: NEGATIVE
Ketones, UA: NEGATIVE
Leukocytes, UA: NEGATIVE
Nitrite, UA: NEGATIVE
RBC UA: NEGATIVE
Spec Grav, UA: 1.02 (ref 1.010–1.025)
UROBILINOGEN UA: 0.2 U/dL
pH, UA: 6 (ref 5.0–8.0)

## 2017-12-05 LAB — CBC WITH DIFFERENTIAL/PLATELET
Basophils Absolute: 0.1 K/uL (ref 0.0–0.1)
Basophils Relative: 0.6 % (ref 0.0–3.0)
Eosinophils Absolute: 0.2 K/uL (ref 0.0–0.7)
Eosinophils Relative: 2.7 % (ref 0.0–5.0)
HCT: 37.8 % (ref 36.0–46.0)
Hemoglobin: 12.5 g/dL (ref 12.0–15.0)
Lymphocytes Relative: 38.1 % (ref 12.0–46.0)
Lymphs Abs: 3.1 K/uL (ref 0.7–4.0)
MCHC: 33.1 g/dL (ref 30.0–36.0)
MCV: 94.5 fl (ref 78.0–100.0)
Monocytes Absolute: 0.7 K/uL (ref 0.1–1.0)
Monocytes Relative: 8.4 % (ref 3.0–12.0)
Neutro Abs: 4 K/uL (ref 1.4–7.7)
Neutrophils Relative %: 50.2 % (ref 43.0–77.0)
Platelets: 355 K/uL (ref 150.0–400.0)
RBC: 4 Mil/uL (ref 3.87–5.11)
RDW: 14.6 % (ref 11.5–15.5)
WBC: 8.1 K/uL (ref 4.0–10.5)

## 2017-12-05 LAB — LIPID PANEL
CHOL/HDL RATIO: 2
Cholesterol: 170 mg/dL (ref 0–200)
HDL: 73.1 mg/dL (ref >39.00)
LDL CALC: 69 mg/dL (ref 0–99)
NONHDL: 96.82
Triglycerides: 137 mg/dL (ref 0.0–149.0)
VLDL: 27.4 mg/dL (ref 0.0–40.0)

## 2017-12-05 MED ORDER — DULOXETINE HCL 60 MG PO CPEP: 60.0000 mg | ORAL_CAPSULE | Freq: Two times a day (BID) | ORAL | 1 refills | Status: DC

## 2017-12-05 MED ORDER — SUCRALFATE 1 GM/10ML PO SUSP: 1.0000 g | Freq: Three times a day (TID) | ORAL | 4 refills | Status: DC

## 2017-12-05 MED ORDER — TRAMADOL HCL 50 MG PO TABS
50.0000 mg | ORAL_TABLET | Freq: Three times a day (TID) | ORAL | 0 refills | Status: DC | PRN
Start: 2017-12-05 — End: 2017-12-05

## 2017-12-05 MED ORDER — METFORMIN HCL 500 MG PO TABS: 500.0000 mg | ORAL_TABLET | Freq: Two times a day (BID) | ORAL | 3 refills | Status: DC

## 2017-12-05 MED ORDER — TRAMADOL HCL 50 MG PO TABS: 50.0000 mg | ORAL_TABLET | Freq: Three times a day (TID) | ORAL | 0 refills | Status: DC | PRN

## 2017-12-05 NOTE — Assessment & Plan Note (Signed)
Lab Results  Component Value Date   HGBA1C 6.8 (H) 11/03/2017   Pt c/o glucose dropping low in office--- she will take 2 mg amaryl at night and con't  4 mg in am since glucose drops mostly in am  She will f/u with endo

## 2017-12-05 NOTE — Patient Instructions (Signed)

## 2017-12-05 NOTE — Assessment & Plan Note (Signed)
Pt still c/o L flank pain Recheck xray today

## 2017-12-05 NOTE — Progress Notes (Signed)
Patient ID: [name], female    DOB: 04/18/1951  Age: 66 y.o. MRN: [id number]    Subjective:  Subjective  HPI [name] presents for f/u hosp for pneumonia.  She was admitted 4/11-4/14 and d/c with z pak, omnicef and pred taper.  She still has the L side pain.   No better.   The sweats, and other symptoms have resolved.  Pt never had cough or fever.  She just had the l side pain and sweats and felt lousy--- side pain never improved.  She has finished all the abx and pred given by hospital.     Review of Systems  Constitutional: Negative for activity change, appetite change, fatigue, fever and unexpected weight change.  HENT: Negative for congestion.   Respiratory: Negative for cough and shortness of breath.   Cardiovascular: Negative for chest pain, palpitations and leg swelling.  Gastrointestinal: Negative for abdominal pain, blood in stool and nausea.  Genitourinary: Negative for dysuria and frequency.  Skin: Negative for rash.  Allergic/Immunologic: Negative for environmental allergies.  Neurological: Negative for dizziness and headaches.  Psychiatric/Behavioral: Negative for behavioral problems and dysphoric mood. The patient is not nervous/anxious.     History Past Medical History:  Diagnosis Date  . Asthma   . Degenerative joint disease   . Diabetes mellitus   . Dyspnea   . GERD (gastroesophageal reflux disease)   . Hyperlipidemia    no longer on medication for this  . Hypertension    patient denies  . Neuropathy    bilateral hands and feet  . Sickle cell trait ([facility])   . Sleep apnea   . stomach ca dx'd 2010   chemo/xrt comp 05/2009  . TIA (transient ischemic attack) 07/2015    She has a past surgical history that includes Total knee arthroplasty; Total hip arthroplasty; Shoulder surgery; Appendectomy; Cesarean section; Hernia repair; Esophagogastroduodenoscopy (N/A, 10/15/2012); Esophagogastroduodenoscopy (N/A, 01/15/2016); Colon surgery; and Colonoscopy with  propofol (N/A, 06/01/2016).   Her family history includes Alzheimer's disease (age of onset: 94) in her father; Cancer in her maternal grandfather; Cancer (age of onset: 74) in her mother; Diabetes in her brother and maternal grandmother.She reports that she quit smoking about 29 years ago. Her smoking use included cigarettes. She started smoking about 44 years ago. She has a 15.00 pack-year smoking history. She has never used smokeless tobacco. She reports that she does not drink alcohol or use drugs.  Current Outpatient Medications on File Prior to Visit  Medication Sig Dispense Refill  . albuterol (PROVENTIL HFA;VENTOLIN HFA) 108 (90 Base) MCG/ACT inhaler Inhale 2 puffs into the lungs every 6 (six) hours as needed for wheezing or shortness of breath. 1 Inhaler 2  . albuterol (PROVENTIL) (2.5 MG/3ML) 0.083% nebulizer solution Take 3 mLs (2.5 mg total) by nebulization every 6 (six) hours as needed for wheezing or shortness of breath. 75 mL 12  . aspirin 81 MG tablet Take 81 mg by mouth at bedtime.    . Blood Glucose Calibration (OT ULTRA/FASTTK CNTRL SOLN) SOLN     . Blood Glucose Monitoring Suppl (ONE TOUCH ULTRA 2) w/Device KIT     . Blood Pressure Monitoring (B-D ASSURE BPM/MANUAL ARM CUFF) MISC Check blood pressure daily. 1 each 0  . cyclobenzaprine (FLEXERIL) 5 MG tablet Take 1 tablet (5 mg total) by mouth 3 (three) times daily as needed for muscle spasms. 30 tablet 1  . gabapentin (NEURONTIN) 400 MG capsule Take 2 capsules (800 mg total) by mouth 3 (  three) times daily. 180 capsule 7  . glimepiride (AMARYL) 4 MG tablet Take 1 tablet (4 mg total) by mouth 2 (two) times daily. 180 tablet 1  . ipratropium-albuterol (DUONEB) 0.5-2.5 (3) MG/3ML SOLN Take 3 mLs by nebulization every 6 (six) hours as needed. (Patient taking differently: Take 3 mLs by nebulization every 6 (six) hours as needed (sob and wheezing). ) 360 mL 2  . Multiple Vitamin (MULTIVITAMIN WITH MINERALS) TABS tablet Take 1 tablet by  mouth daily.    . ONE TOUCH ULTRA TEST test strip      Current Facility-Administered Medications on File Prior to Visit  Medication Dose Route Frequency Provider Last Rate Last Dose  . sodium chloride flush (NS) 0.9 % injection 10 mL  10 mL Intravenous PRN [place], Holli Humbles, NP   10 mL at 09/13/16 1146     Objective:  Objective  Physical Exam  Constitutional: She is oriented to person, place, and time. She appears well-developed and well-nourished.  HENT:  Head: Normocephalic and atraumatic.  Eyes: Conjunctivae and EOM are normal.  Neck: Normal range of motion. Neck supple. No JVD present. Carotid bruit is not present. No thyromegaly present.  Cardiovascular: Normal rate, regular rhythm and normal heart sounds.  No murmur heard. Pulmonary/Chest: Effort normal and breath sounds normal. No respiratory distress. She has no wheezes. She has no rales. She exhibits no tenderness.  Abdominal: Soft. Bowel sounds are normal. She exhibits no distension and no mass. There is no tenderness. There is no rebound and no guarding. No hernia.  Musculoskeletal: She exhibits no edema.  Neurological: She is alert and oriented to person, place, and time.  Psychiatric: She has a normal mood and affect.  Nursing note and vitals reviewed.  BP 138/78 (BP Location: Left Arm, Cuff Size: Large)   Pulse 96   Temp 98.1 F (36.7 C) (Oral)   Resp 16   Ht 5' 7" (1.702 m)   Wt 199 lb 12.8 oz (90.6 kg)   SpO2 97%   BMI 31.29 kg/m  Wt Readings from Last 3 Encounters:  12/05/17 199 lb 12.8 oz (90.6 kg)  11/03/17 218 lb 0.6 oz (98.9 kg)  11/02/17 211 lb (95.7 kg)     Lab Results  Component Value Date   WBC 9.8 11/07/2017   HGB 12.0 11/07/2017   HCT 35.4 (L) 11/07/2017   PLT 330 11/07/2017   GLUCOSE 113 (H) 11/07/2017   CHOL 149 12/27/2016   TRIG 93.0 12/27/2016   HDL 56.80 12/27/2016   LDLCALC 73 12/27/2016   ALT 32 11/07/2017   AST 32 11/07/2017   NA 144 11/07/2017   K 3.8 11/07/2017   CL 108  11/07/2017   CREATININE 1.03 (H) 11/07/2017   BUN 8 11/07/2017   CO2 24 11/07/2017   TSH 3.061 05/13/2016   INR 0.98 11/05/2010   HGBA1C 6.8 (H) 11/03/2017   MICROALBUR 0.7 06/21/2016    No results found.   Assessment & Plan:  Plan  I have discontinued Leane Platt. Humber's EMBEDA, predniSONE, pantoprazole, and famotidine. I am also having her maintain her multivitamin with minerals, aspirin, B-D ASSURE BPM/MANUAL ARM CUFF, OT ULTRA/FASTTK CNTRL SOLN, ONE TOUCH ULTRA 2, ONE TOUCH ULTRA TEST, cyclobenzaprine, ipratropium-albuterol, gabapentin, glimepiride, albuterol, albuterol, sucralfate, metFORMIN, DULoxetine, and traMADol.  Meds ordered this encounter  Medications  . DISCONTD: traMADol (ULTRAM) 50 MG tablet    Sig: Take 1 tablet (50 mg total) by mouth 3 (three) times daily as needed for moderate pain.  Dispense:  90 tablet    Refill:  0  . sucralfate (CARAFATE) 1 GM/10ML suspension    Sig: Take 10 mLs (1 g total) by mouth 4 (four) times daily -  with meals and at bedtime.    Dispense:  420 mL    Refill:  4  . metFORMIN (GLUCOPHAGE) 500 MG tablet    Sig: Take 1 tablet (500 mg total) by mouth 2 (two) times daily with a meal.    Dispense:  180 tablet    Refill:  3  . DULoxetine (CYMBALTA) 60 MG capsule    Sig: Take 1 capsule (60 mg total) by mouth 2 (two) times daily.    Dispense:  180 capsule    Refill:  1  . traMADol (ULTRAM) 50 MG tablet    Sig: Take 1 tablet (50 mg total) by mouth 3 (three) times daily as needed for moderate pain.    Dispense:  90 tablet    Refill:  0    Problem List Items Addressed This Visit      Unprioritized   Bronchopneumonia - Primary    Pt still c/o L flank pain Recheck xray today      Relevant Orders   DG Chest 2 View   CBC with Differential/Platelet   Chronic pain syndrome (Chronic)    con't tramadol uds done Contract signed  Database reviewed       Relevant Medications   traMADol (ULTRAM) 50 MG tablet   CKD (chronic kidney  disease), stage III (HCC)    Lab Results  Component Value Date   CREATININE 1.03 (H) 11/07/2017   Improved con't to inc fluid intake Recheck 3 months      Diabetes mellitus with circulatory complication, without long-term current use of insulin (HCC) (Chronic)    Lab Results  Component Value Date   HGBA1C 6.8 (H) 11/03/2017   Pt c/o glucose dropping low in office--- she will take 2 mg amaryl at night and con't  4 mg in am since glucose drops mostly in am  She will f/u with endo       Relevant Medications   metFORMIN (GLUCOPHAGE) 500 MG tablet   Essential hypertension    Well controlled, no changes to meds. Encouraged heart healthy diet such as the DASH diet and exercise as tolerated.       Hyperlipidemia    Tolerating statin, encouraged heart healthy diet, avoid trans fats, minimize simple carbs and saturated fats. Increase exercise as tolerated       Other Visit Diagnoses    Flank pain       Relevant Orders   POCT Urinalysis Dipstick (Automated) (Completed)   Urine Culture   Hyperlipidemia LDL goal <70       Relevant Orders   Lipid panel    pt in office > 30 min with >50%time face to face discussing her pneumonia, dm, chol and htn.    Follow-up: Return in about 3 months (around 03/07/2018), or if symptoms worsen or fail to improve, for hypertension, hyperlipidemia, diabetes II, pain.  Ann Held, DO

## 2017-12-05 NOTE — Assessment & Plan Note (Addendum)
con't tramadol uds done Contract signed  Database reviewed

## 2017-12-05 NOTE — Assessment & Plan Note (Addendum)
Lab Results  Component Value Date   CREATININE 1.03 (H) 11/07/2017   Improved con't to inc fluid intake Recheck 3 months

## 2017-12-05 NOTE — Assessment & Plan Note (Signed)
Well controlled, no changes to meds. Encouraged heart healthy diet such as the DASH diet and exercise as tolerated.  °

## 2017-12-05 NOTE — Assessment & Plan Note (Signed)
Tolerating statin, encouraged heart healthy diet, avoid trans fats, minimize simple carbs and saturated fats. Increase exercise as tolerated 

## 2017-12-05 NOTE — Addendum Note (Signed)
Addended by: Kem Boroughs D on: 12/05/2017 01:46 PM   Modules accepted: Orders

## 2017-12-06 ENCOUNTER — Telehealth: Payer: Self-pay | Admitting: *Deleted

## 2017-12-06 LAB — URINE CULTURE
MICRO NUMBER: 90585863
SPECIMEN QUALITY: ADEQUATE

## 2017-12-06 NOTE — Telephone Encounter (Signed)
Patient calling this office stating that the cost of some of her medication is too high, and she'd like cheaper alternatives.   Patient states the prescription for duloxetine has increased from $3 to $40 and the carafate cost has also gone up.  Reviewed with Laverna Peace NP. Medications reviewed, and both are prescribed by her PCP. Patient will need to follow up with PCP regarding the cost of her medications.  Patient is aware of instruction. Also suggested she call her insurance to inquire as to which medications may be better priced before calling PCP so that she could give the list of covered alternatives.

## 2017-12-10 LAB — PAIN MGMT, TRAMADOL W/MEDMATCH, U: DESMETHYLTRAMADOL: 6912 ng/mL — AB (ref <100)

## 2017-12-10 LAB — PAIN MGMT, PROFILE 8 W/CONF, U
6 Acetylmorphine: NEGATIVE ng/mL (ref <10)
Alcohol Metabolites: NEGATIVE ng/mL (ref <500)
Amphetamines: NEGATIVE ng/mL (ref <500)
BENZODIAZEPINES: NEGATIVE ng/mL (ref <100)
Buprenorphine, Urine: NEGATIVE ng/mL (ref <5)
Cocaine Metabolite: NEGATIVE ng/mL (ref <150)
Codeine: NEGATIVE ng/mL (ref <50)
Creatinine: 156.5 mg/dL
HYDROCODONE: NEGATIVE ng/mL (ref <50)
Hydromorphone: NEGATIVE ng/mL (ref <50)
MARIJUANA METABOLITE: NEGATIVE ng/mL (ref <20)
MDMA: NEGATIVE ng/mL (ref <500)
MORPHINE: 68 ng/mL — AB (ref <50)
Norhydrocodone: NEGATIVE ng/mL (ref <50)
OXIDANT: NEGATIVE ug/mL (ref <200)
OXYCODONE: NEGATIVE ng/mL (ref <100)
Opiates: POSITIVE ng/mL — AB (ref <100)
pH: 5.33 (ref 4.5–9.0)

## 2017-12-11 ENCOUNTER — Telehealth: Payer: Self-pay | Admitting: Family Medicine

## 2017-12-11 NOTE — Telephone Encounter (Signed)
Copied from [place] [phone]. Topic: Quick Communication - See Telephone Encounter >> Dec 11, 2017 11:43 AM Ahmed Prima L wrote: CRM for notification. See Telephone encounter for: 12/11/17.  Patient said that the prescription for traMADol (ULTRAM) 50 MG tablet was never sent in when she was in the office on 5/14. [id number] [place] [place] [id number]

## 2017-12-12 ENCOUNTER — Other Ambulatory Visit: Payer: Self-pay | Admitting: Family Medicine

## 2017-12-12 DIAGNOSIS — G894 Chronic pain syndrome: Secondary | ICD-10-CM

## 2017-12-12 MED ORDER — TRAMADOL HCL 50 MG PO TABS
50.0000 mg | ORAL_TABLET | Freq: Three times a day (TID) | ORAL | 0 refills | Status: DC | PRN
Start: 2017-12-12 — End: 2018-04-09

## 2017-12-12 NOTE — Telephone Encounter (Signed)
Pt called back. Aware [facility]. pt would like results of labs done that day as well.

## 2017-12-12 NOTE — Telephone Encounter (Signed)
[facility].  I have called and cancelled the one at [facility].

## 2017-12-12 NOTE — Telephone Encounter (Signed)
done

## 2017-12-13 NOTE — Telephone Encounter (Signed)
Phone number has been changed on home number and mobile number does not ring and pickup no vm.  Unable to reach letter sent.

## 2018-01-06 IMAGING — CT CT ABD-PELV W/ CM
2 of 5 series · 16 of 46 positions shown, 18 images · IV contrast (APPLIED)
Comparison: 09/14/2016 limited abdominal ultrasound. Most recent CT
of 03/22/2016.

CLINICAL DATA: Gastric cancer. Partial gastrectomy. Abdominal pain
and tenderness. Left upper quadrant abdominal pain.

EXAM:
CT ABDOMEN AND PELVIS WITH CONTRAST
TECHNIQUE: Multidetector CT imaging of the abdomen and pelvis was performed
using the standard protocol following bolus administration of
intravenous contrast.
CONTRAST:  80mL [id number] IOPAMIDOL (GXFWJ9-BOO) INJECTION 61%

[Series 2: axial st · axial · 0.81mm/px · z∈[-453,-13]mm · 13 of 100 slices shown, 15 images]
[im 6/100  soft-tissue]
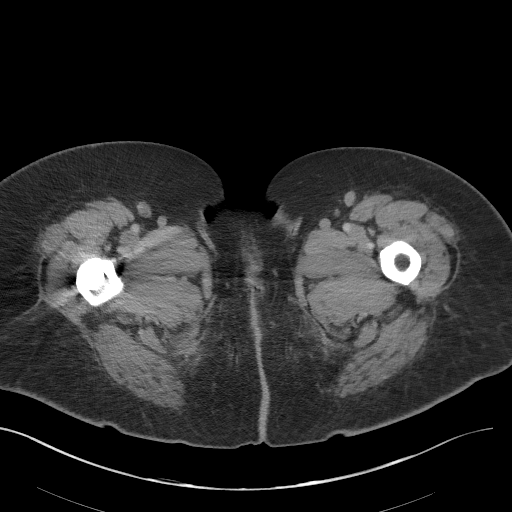
[im 6/100  bone]
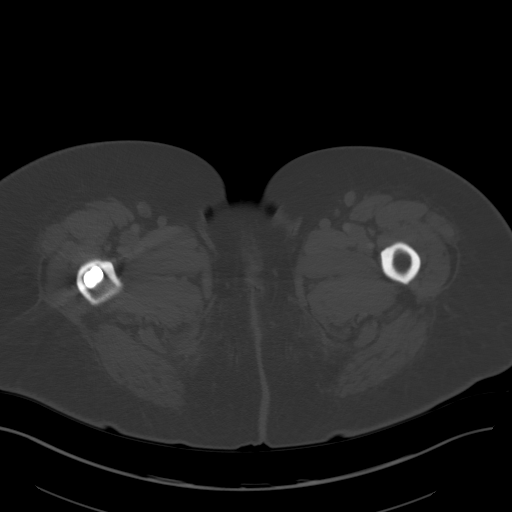
[im 16/100  soft-tissue]
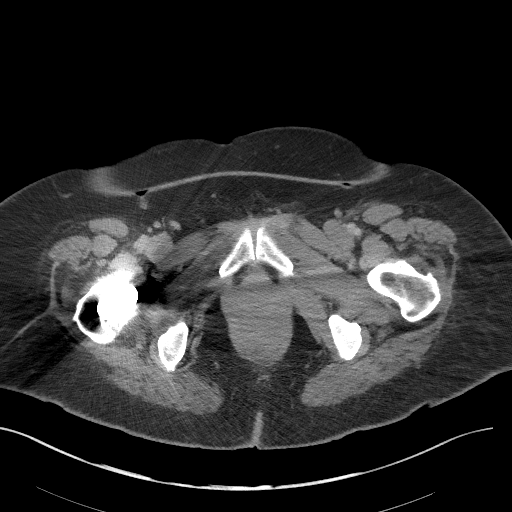
[im 21/100  soft-tissue]
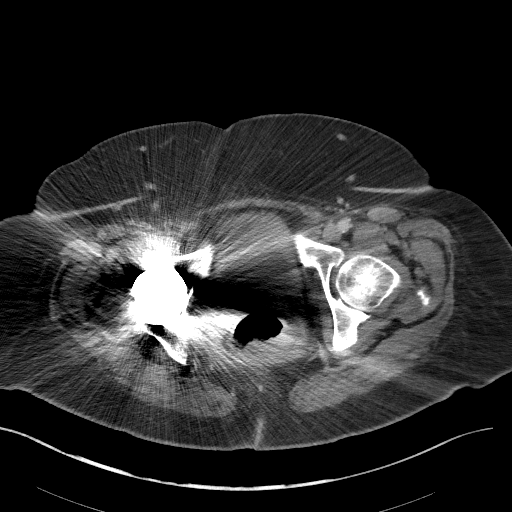
[im 27/100  soft-tissue]
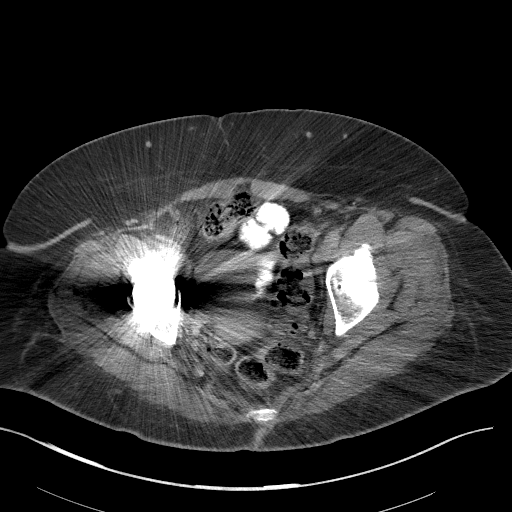
[im 37/100  soft-tissue]
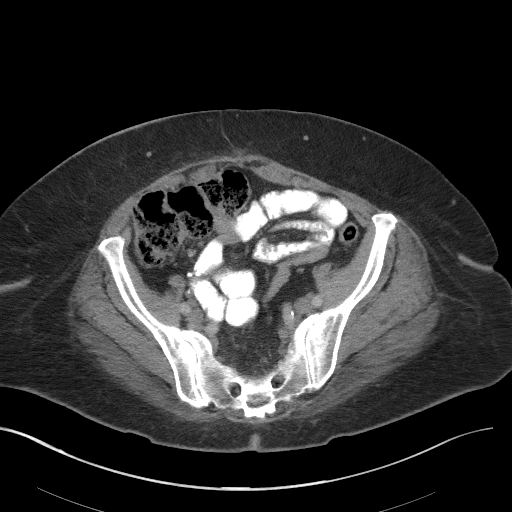
[im 42/100  soft-tissue]
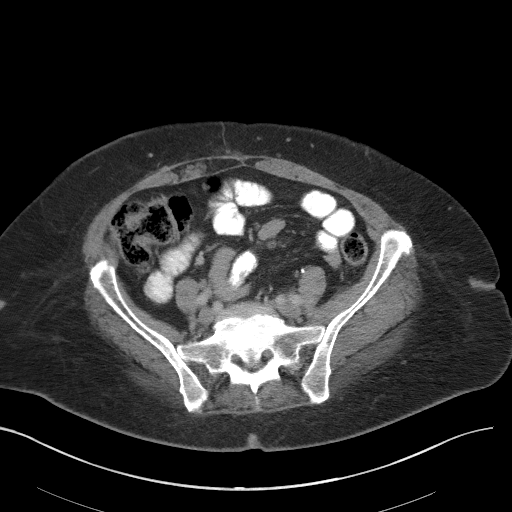
[im 53/100  soft-tissue]
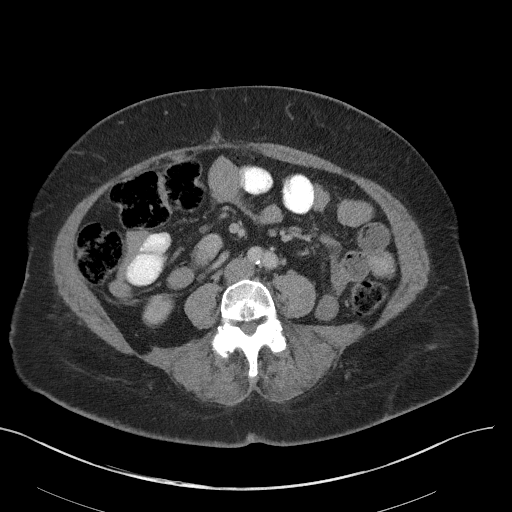
[im 58/100  soft-tissue]
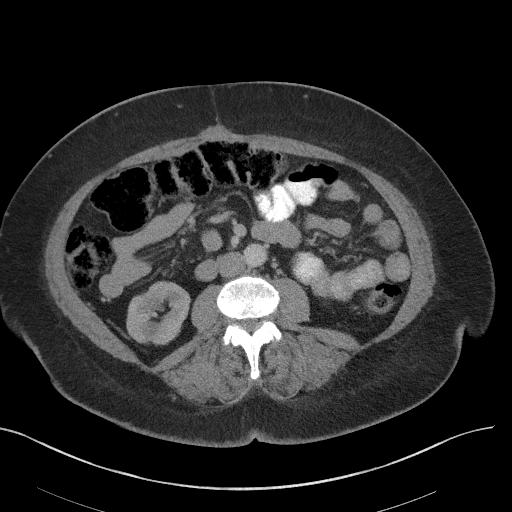
[im 63/100  soft-tissue]
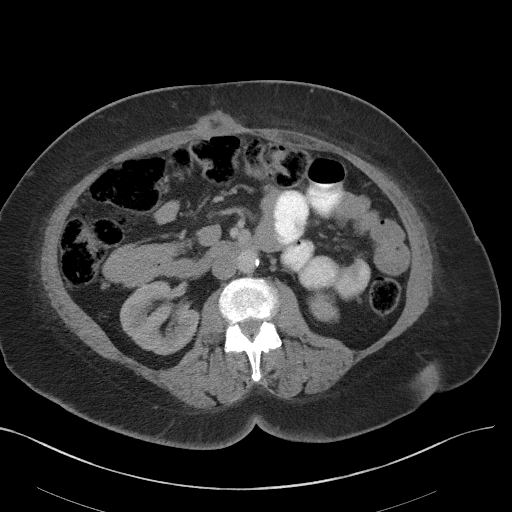
[im 63/100  bone]
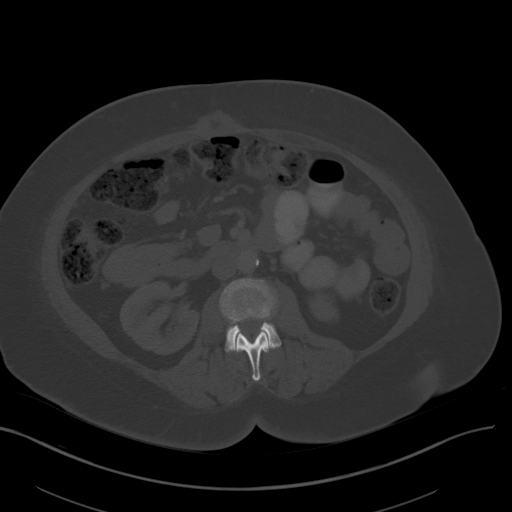
[im 73/100  soft-tissue]
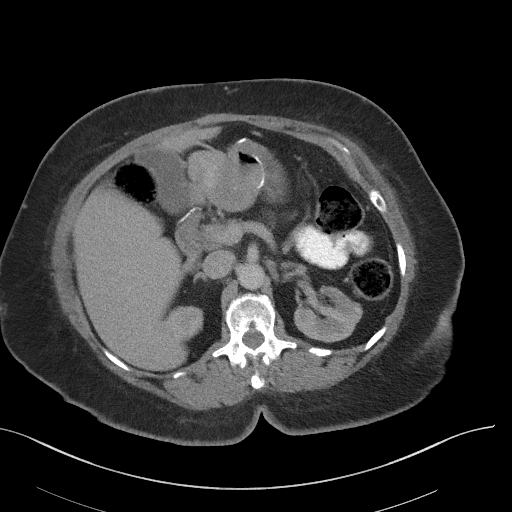
[im 79/100  soft-tissue]
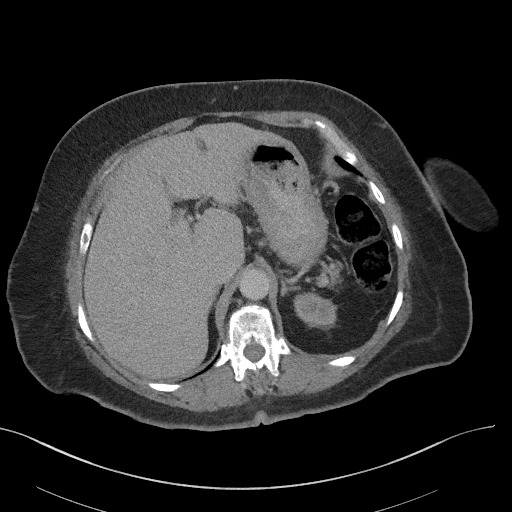
[im 84/100  soft-tissue]
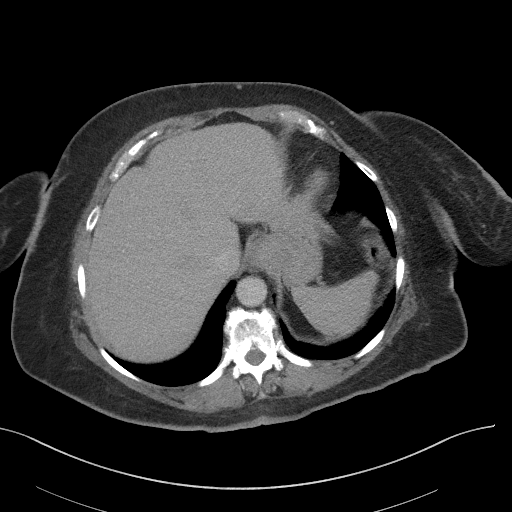
[im 94/100  soft-tissue]
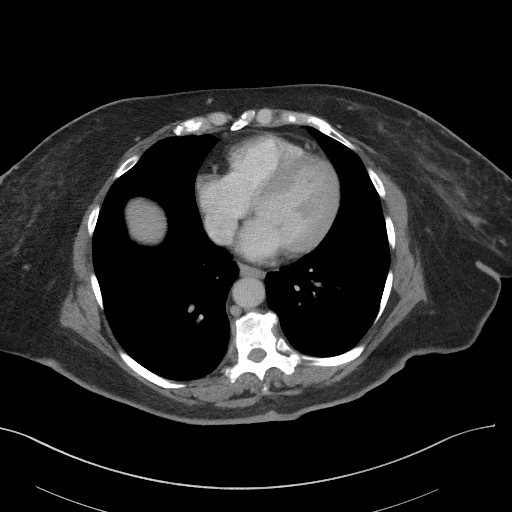

[Series 5: coronal st · coronal · 0.93mm/px · 3 of 91 slices shown]
[im 31/91  soft-tissue]
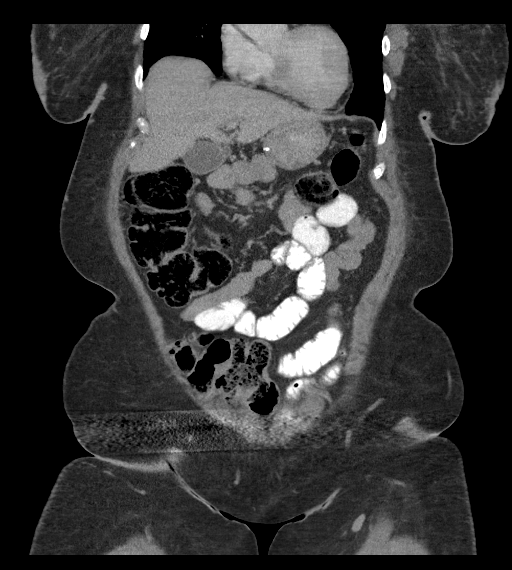
[im 41/91  soft-tissue]
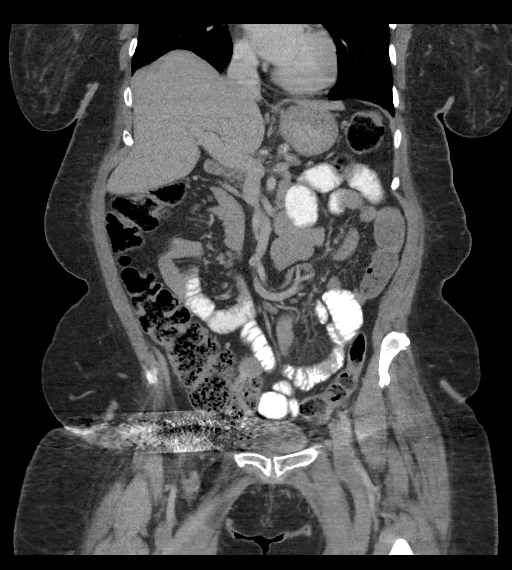
[im 51/91  soft-tissue]
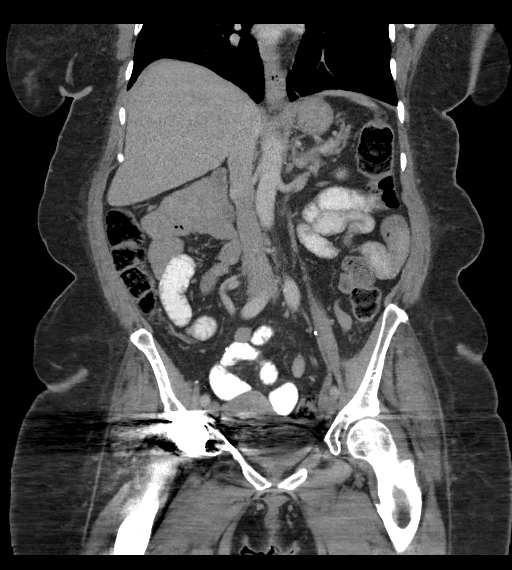

[16 of 46 positions shown; findings below may reference images not displayed]

FINDINGS: Lower chest: Clear lung bases. Normal heart size without pericardial
or pleural effusion. Lad coronary artery atherosclerosis.

Hepatobiliary: Improvement to resolution of hepatic steatosis.
Caudate lobe prominence. No focal liver lesion. Normal gallbladder,
without biliary ductal dilatation.

Pancreas: Pancreatic atrophy, without duct dilatation or dominant
mass.

Spleen: Normal in size, without focal abnormality.

Adrenals/Urinary Tract: Normal adrenal glands. Mild left renal
cortical thinning/ scarring. Normal right kidney, without
hydronephrosis. Degraded evaluation of the pelvis, secondary to beam
hardening artifact from right hip arthroplasty. Grossly normal
urinary bladder.

Stomach/Bowel: Status post gastrojejunostomy. Underdistention
involves the jejunum at the site of anastomosis. No surrounding
edema to suggest inflammation or local recurrence. Scattered colonic
diverticula. Normal terminal ileum. Normal small bowel.

Vascular/Lymphatic: Aortic atherosclerosis. No abdominal and no
gross pelvic adenopathy.

Reproductive: Grossly normal uterus without adnexal mass.

Other: No abdominal ascites. No evidence of omental or peritoneal
disease. Similar soft tissue thickening about the midline of the
anterior abdominal wall including on image 32/series 2, likely
postoperative scarring. No well-defined fluid collection.

Musculoskeletal: Right hip arthroplasty. No acute osseous
abnormality.
IMPRESSION: 1. Status post partial gastrectomy and gastrojejunostomy. No
evidence of locally recurrent or metastatic disease. No acute
complication.
2.  Aortic atherosclerosis.
3. Degraded evaluation of the pelvis, secondary to beam hardening
artifact from right hip arthroplasty.
4. Improved to resolved hepatic steatosis.

## 2018-01-08 ENCOUNTER — Other Ambulatory Visit: Payer: Self-pay | Admitting: Family Medicine

## 2018-01-08 DIAGNOSIS — Z1231 Encounter for screening mammogram for malignant neoplasm of breast: Secondary | ICD-10-CM

## 2018-02-08 IMAGING — US US EXTREM LOW VENOUS*R*
1 series · 14 of 24 positions shown · non-contrast
Comparison: None.

CLINICAL DATA: Right lower extremity pain for 2 weeks

EXAM:
RIGHT LOWER EXTREMITY VENOUS DUPLEX ULTRASOUND
TECHNIQUE: Doppler venous assessment of the right lower extremity deep venous
system was performed, including characterization of spectral flow,
compressibility, and phasicity.

[Series 1: us extrem low venous*right* · 0.08mm/px · 29 acquisitions, 14 frames shown]
[im 1/29]
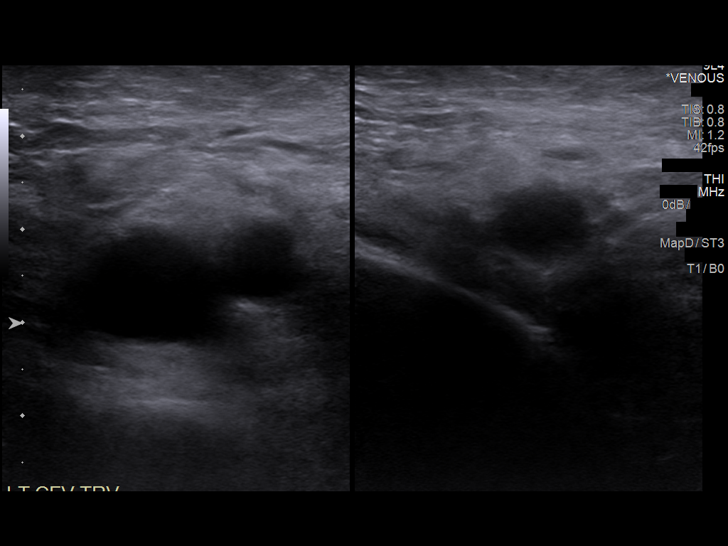
[im 3/29]
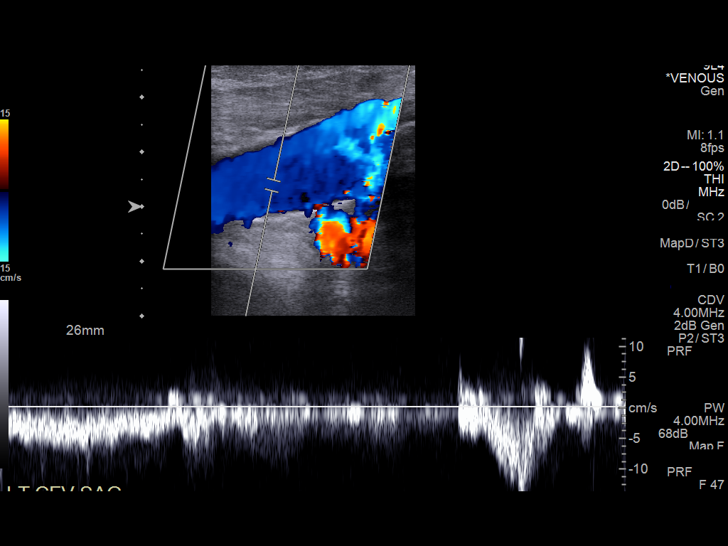
[im 5/29]
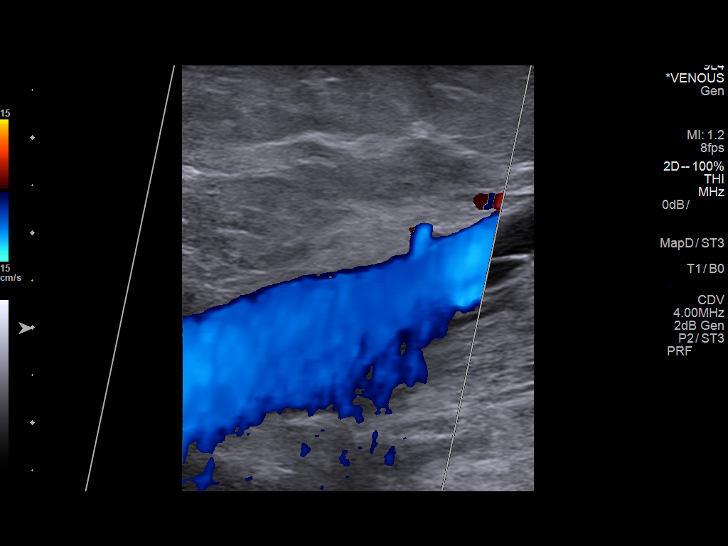
[im 9/29]
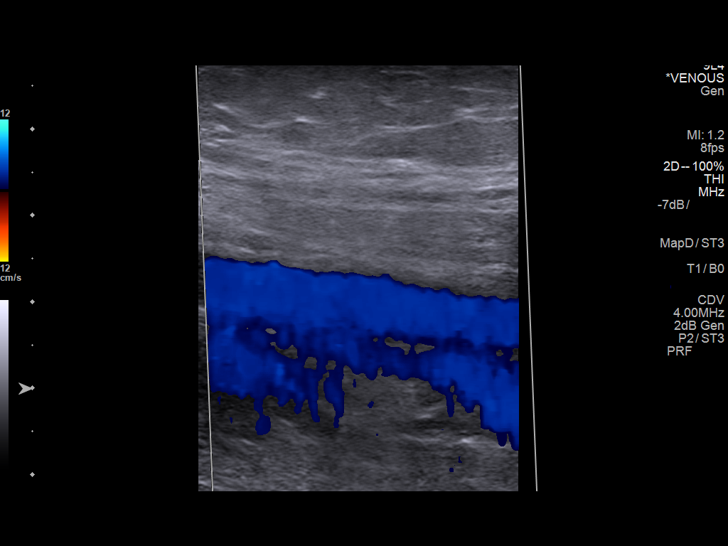
[im 10/29]
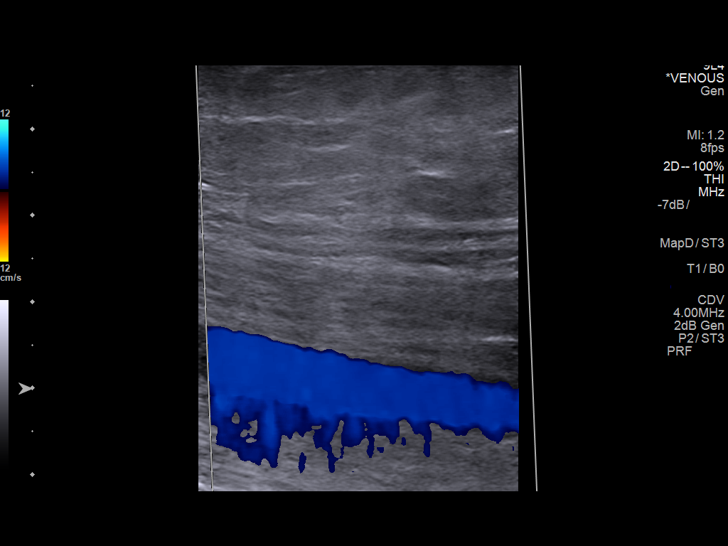
[im 13/29]
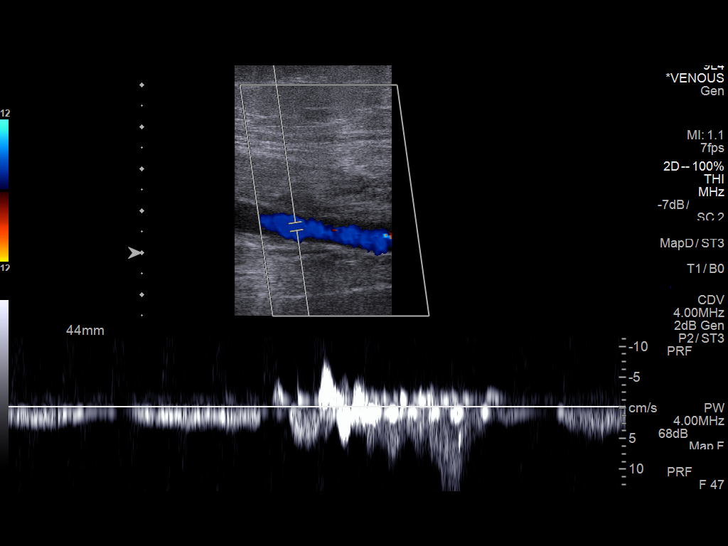
[im 15/29]
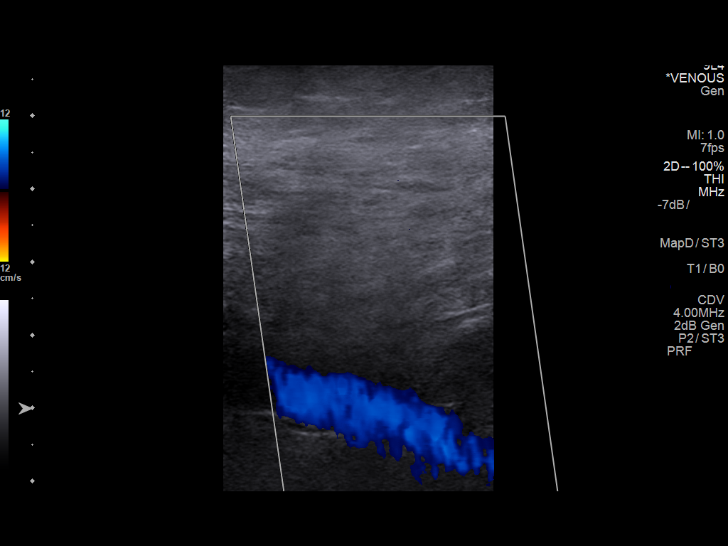
[im 16/29]
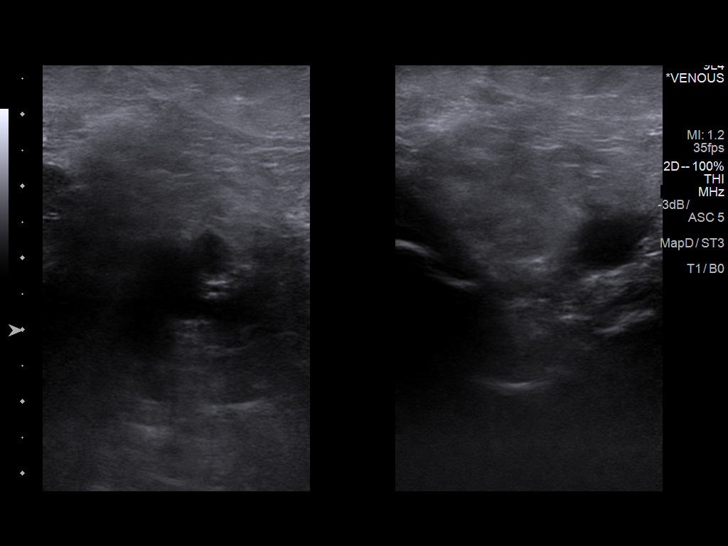
[im 19/29]
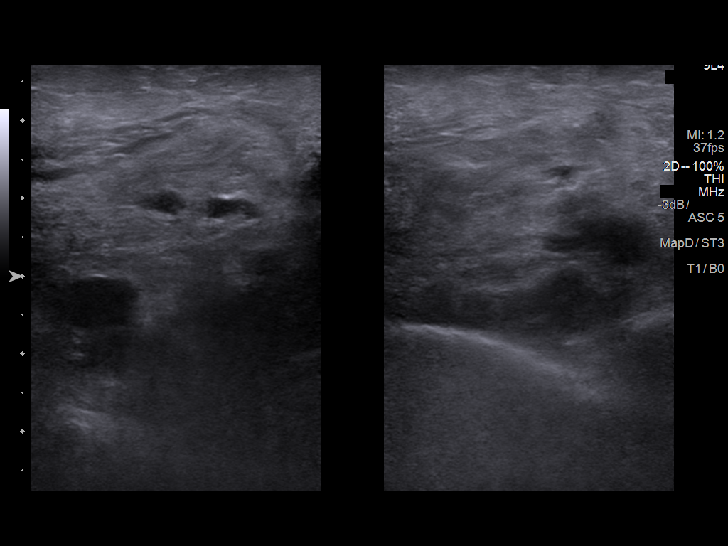
[im 21/29]
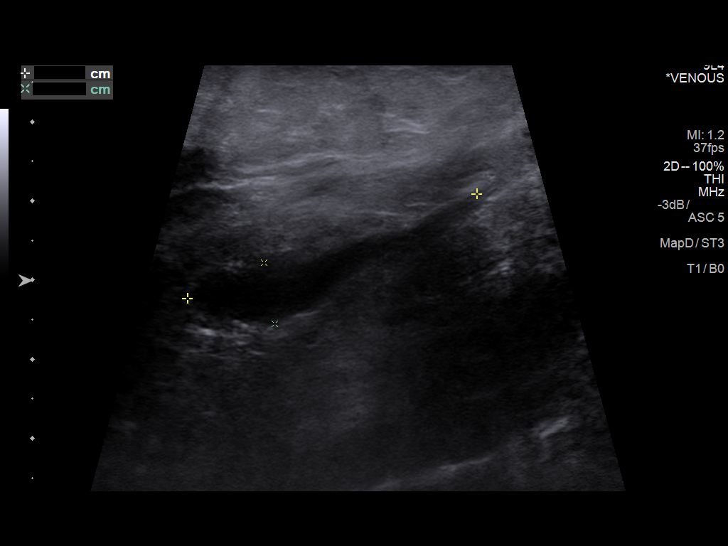
[im 24/29]
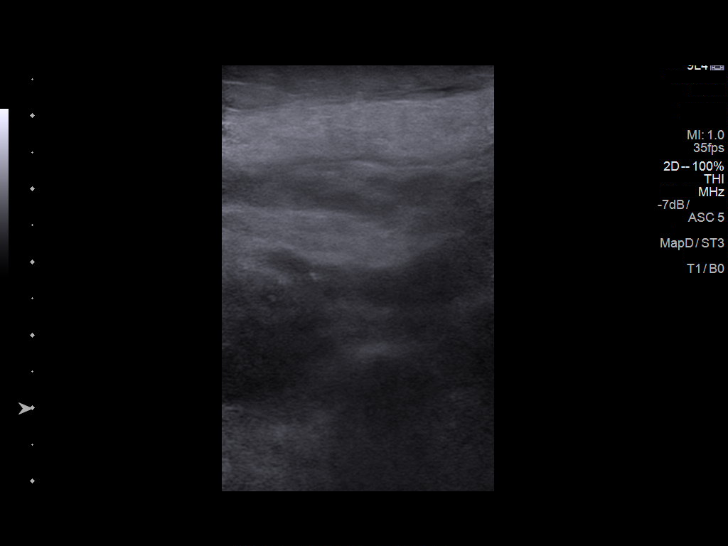
[im 25/29]
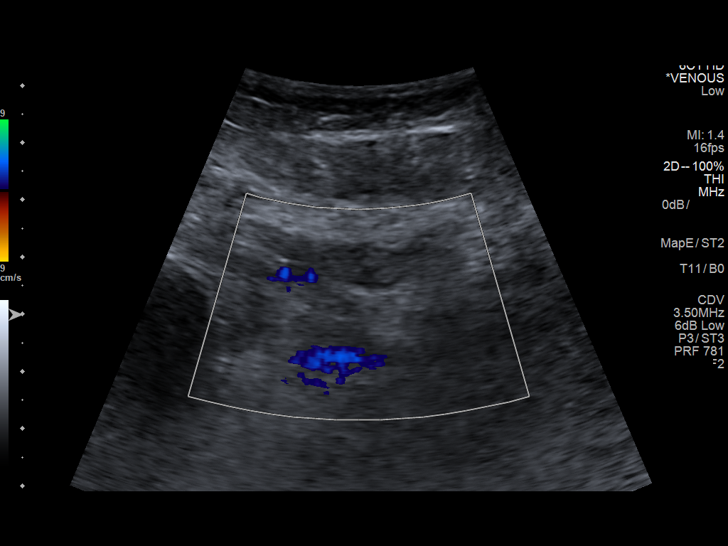
[im 26/29]
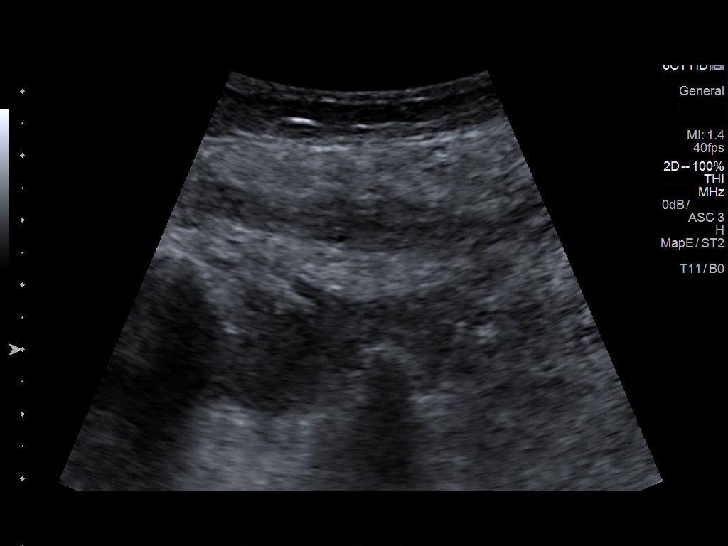
[im 29/29]
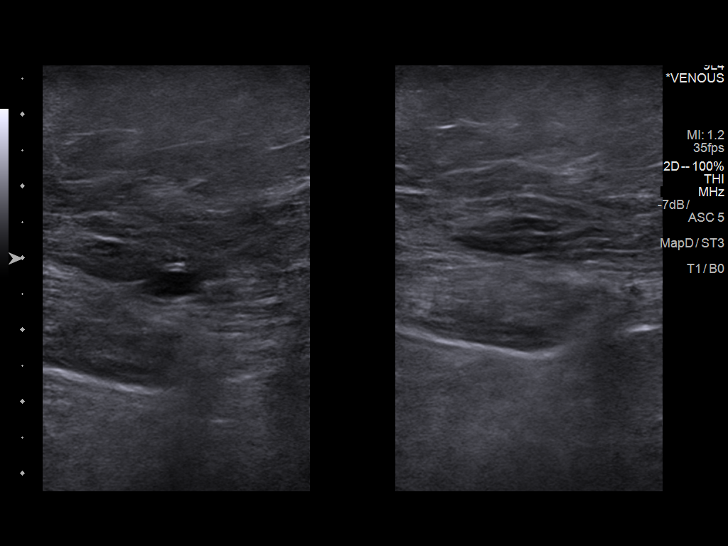

[14 of 24 positions shown; findings below may reference images not displayed]

FINDINGS: There is complete compressibility of the right common femoral,
femoral, and popliteal veins. Doppler analysis demonstrates
respiratory phasicity and augmentation of flow with calf
compression. No obvious superficial vein or calf vein thrombosis.

2.4 x 3.9 x 0.8 cm small popliteal fossa Baker's cyst.
IMPRESSION: No evidence of right lower extremity DVT.

Small Baker's cyst.

## 2018-02-09 ENCOUNTER — Telehealth: Payer: Self-pay | Admitting: *Deleted

## 2018-02-09 ENCOUNTER — Other Ambulatory Visit: Payer: Self-pay | Admitting: Family

## 2018-02-09 NOTE — Telephone Encounter (Signed)
Call placed back to patient to notify her that scheduling would be contacting her next week for appt.  Pt appreciative of call back and has no questions at this time.

## 2018-02-09 NOTE — Telephone Encounter (Signed)
Patient called and requested appt to see provider and port flush.  Gwenyth Bouillon NP notified and message sent to scheduler by S. [place] NP.

## 2018-02-20 ENCOUNTER — Telehealth: Payer: Self-pay | Admitting: *Deleted

## 2018-02-20 NOTE — Telephone Encounter (Signed)
[facility]; forwarded to provider/SLS 07/30

## 2018-02-26 ENCOUNTER — Inpatient Hospital Stay: Payer: Medicare HMO

## 2018-02-26 ENCOUNTER — Telehealth: Payer: Self-pay

## 2018-02-26 ENCOUNTER — Other Ambulatory Visit: Payer: Self-pay | Admitting: Family

## 2018-02-26 ENCOUNTER — Encounter: Payer: Self-pay | Admitting: Family

## 2018-02-26 ENCOUNTER — Inpatient Hospital Stay: Payer: Medicare HMO | Attending: Family | Admitting: Family

## 2018-02-26 ENCOUNTER — Other Ambulatory Visit: Payer: Self-pay

## 2018-02-26 VITALS — BP 120/77 | HR 86 | Temp 98.2°F | Resp 16 | Wt 218.0 lb

## 2018-02-26 DIAGNOSIS — Z85028 Personal history of other malignant neoplasm of stomach: Secondary | ICD-10-CM | POA: Diagnosis not present

## 2018-02-26 DIAGNOSIS — J45909 Unspecified asthma, uncomplicated: Secondary | ICD-10-CM | POA: Diagnosis not present

## 2018-02-26 DIAGNOSIS — D508 Other iron deficiency anemias: Secondary | ICD-10-CM

## 2018-02-26 DIAGNOSIS — R42 Dizziness and giddiness: Secondary | ICD-10-CM

## 2018-02-26 DIAGNOSIS — C169 Malignant neoplasm of stomach, unspecified: Secondary | ICD-10-CM

## 2018-02-26 DIAGNOSIS — J4541 Moderate persistent asthma with (acute) exacerbation: Secondary | ICD-10-CM | POA: Diagnosis not present

## 2018-02-26 DIAGNOSIS — R51 Headache: Secondary | ICD-10-CM | POA: Diagnosis not present

## 2018-02-26 DIAGNOSIS — E669 Obesity, unspecified: Secondary | ICD-10-CM | POA: Diagnosis not present

## 2018-02-26 DIAGNOSIS — C162 Malignant neoplasm of body of stomach: Secondary | ICD-10-CM

## 2018-02-26 DIAGNOSIS — G894 Chronic pain syndrome: Secondary | ICD-10-CM

## 2018-02-26 DIAGNOSIS — G4452 New daily persistent headache (NDPH): Secondary | ICD-10-CM

## 2018-02-26 DIAGNOSIS — K760 Fatty (change of) liver, not elsewhere classified: Secondary | ICD-10-CM | POA: Diagnosis not present

## 2018-02-26 DIAGNOSIS — Z9221 Personal history of antineoplastic chemotherapy: Secondary | ICD-10-CM | POA: Diagnosis not present

## 2018-02-26 DIAGNOSIS — Z923 Personal history of irradiation: Secondary | ICD-10-CM | POA: Insufficient documentation

## 2018-02-26 DIAGNOSIS — Z95828 Presence of other vascular implants and grafts: Secondary | ICD-10-CM

## 2018-02-26 DIAGNOSIS — L89609 Pressure ulcer of unspecified heel, unspecified stage: Secondary | ICD-10-CM | POA: Diagnosis not present

## 2018-02-26 DIAGNOSIS — M25511 Pain in right shoulder: Secondary | ICD-10-CM | POA: Diagnosis not present

## 2018-02-26 DIAGNOSIS — A419 Sepsis, unspecified organism: Secondary | ICD-10-CM | POA: Diagnosis not present

## 2018-02-26 DIAGNOSIS — R269 Unspecified abnormalities of gait and mobility: Secondary | ICD-10-CM | POA: Diagnosis not present

## 2018-02-26 DIAGNOSIS — Z903 Acquired absence of stomach [part of]: Secondary | ICD-10-CM | POA: Diagnosis not present

## 2018-02-26 DIAGNOSIS — G5793 Unspecified mononeuropathy of bilateral lower limbs: Secondary | ICD-10-CM

## 2018-02-26 DIAGNOSIS — I10 Essential (primary) hypertension: Secondary | ICD-10-CM | POA: Diagnosis not present

## 2018-02-26 LAB — CBC WITH DIFFERENTIAL (CANCER CENTER ONLY)
BASOS ABS: 0 10*3/uL (ref 0.0–0.1)
BASOS PCT: 0 %
EOS PCT: 15 %
Eosinophils Absolute: 1.2 10*3/uL — ABNORMAL HIGH (ref 0.0–0.5)
HCT: 38.1 % (ref 34.8–46.6)
Hemoglobin: 12.6 g/dL (ref 11.6–15.9)
Lymphocytes Relative: 36 %
Lymphs Abs: 2.9 10*3/uL (ref 0.9–3.3)
MCH: 29.4 pg (ref 26.0–34.0)
MCHC: 33.1 g/dL (ref 32.0–36.0)
MCV: 89 fL (ref 81.0–101.0)
MONO ABS: 0.6 10*3/uL (ref 0.1–0.9)
Monocytes Relative: 7 %
NEUTROS ABS: 3.4 10*3/uL (ref 1.5–6.5)
Neutrophils Relative %: 42 %
PLATELETS: 321 10*3/uL (ref 145–400)
RBC: 4.28 MIL/uL (ref 3.70–5.32)
RDW: 13.1 % (ref 11.1–15.7)
WBC Count: 8.1 10*3/uL (ref 3.9–10.0)

## 2018-02-26 LAB — CMP (CANCER CENTER ONLY)
ALBUMIN: 3.6 g/dL (ref 3.5–5.0)
ALT: 16 U/L (ref 10–47)
AST: 20 U/L (ref 11–38)
Alkaline Phosphatase: 122 U/L — ABNORMAL HIGH (ref 26–84)
Anion gap: 11 (ref 5–15)
BUN: 18 mg/dL (ref 7–22)
CALCIUM: 9.2 mg/dL (ref 8.0–10.3)
CO2: 29 mmol/L (ref 18–33)
CREATININE: 1.1 mg/dL (ref 0.60–1.20)
Chloride: 107 mmol/L (ref 98–108)
GLUCOSE: 151 mg/dL — AB (ref 73–118)
Potassium: 4.6 mmol/L (ref 3.3–4.7)
Sodium: 147 mmol/L — ABNORMAL HIGH (ref 128–145)
Total Bilirubin: 0.5 mg/dL (ref 0.2–1.6)
Total Protein: 7.5 g/dL (ref 6.4–8.1)

## 2018-02-26 MED ORDER — SODIUM CHLORIDE 0.9% FLUSH
10.0000 mL | INTRAVENOUS | Status: DC | PRN
  Administered 2018-02-26: 10 mL via INTRAVENOUS
  Filled 2018-02-26: qty 10

## 2018-02-26 MED ORDER — HEPARIN SOD (PORK) LOCK FLUSH 100 UNIT/ML IV SOLN
500.0000 [IU] | Freq: Once | INTRAVENOUS | Status: AC
  Administered 2018-02-26: 500 [IU] via INTRAVENOUS
  Filled 2018-02-26: qty 5

## 2018-02-26 NOTE — Patient Instructions (Signed)
Implanted Port Home Guide An implanted port is a type of central line that is placed under the skin. Central lines are used to provide IV access when treatment or nutrition needs to be given through a person's veins. Implanted ports are used for long-term IV access. An implanted port may be placed because:  You need IV medicine that would be irritating to the small veins in your hands or arms.  You need long-term IV medicines, such as antibiotics.  You need IV nutrition for a long period.  You need frequent blood draws for lab tests.  You need dialysis.  Implanted ports are usually placed in the chest area, but they can also be placed in the upper arm, the abdomen, or the leg. An implanted port has two main parts:  Reservoir. The reservoir is round and will appear as a small, raised area under your skin. The reservoir is the part where a needle is inserted to give medicines or draw blood.  Catheter. The catheter is a thin, flexible tube that extends from the reservoir. The catheter is placed into a large vein. Medicine that is inserted into the reservoir goes into the catheter and then into the vein.  How will I care for my incision site? Do not get the incision site wet. Bathe or shower as directed by your health care provider. How is my port accessed? Special steps must be taken to access the port:  Before the port is accessed, a numbing cream can be placed on the skin. This helps numb the skin over the port site.  Your health care provider uses a sterile technique to access the port. ? Your health care provider must put on a mask and sterile gloves. ? The skin over your port is cleaned carefully with an antiseptic and allowed to dry. ? The port is gently pinched between sterile gloves, and a needle is inserted into the port.  Only "non-coring" port needles should be used to access the port. Once the port is accessed, a blood return should be checked. This helps ensure that the port  is in the vein and is not clogged.  If your port needs to remain accessed for a constant infusion, a clear (transparent) bandage will be placed over the needle site. The bandage and needle will need to be changed every week, or as directed by your health care provider.  Keep the bandage covering the needle clean and dry. Do not get it wet. Follow your health care provider's instructions on how to take a shower or bath while the port is accessed.  If your port does not need to stay accessed, no bandage is needed over the port.  What is flushing? Flushing helps keep the port from getting clogged. Follow your health care provider's instructions on how and when to flush the port. Ports are usually flushed with saline solution or a medicine called heparin. The need for flushing will depend on how the port is used.  If the port is used for intermittent medicines or blood draws, the port will need to be flushed: ? After medicines have been given. ? After blood has been drawn. ? As part of routine maintenance.  If a constant infusion is running, the port may not need to be flushed.  How long will my port stay implanted? The port can stay in for as long as your health care provider thinks it is needed. When it is time for the port to come out, surgery will be   done to remove it. The procedure is similar to the one performed when the port was put in. When should I seek immediate medical care? When you have an implanted port, you should seek immediate medical care if:  You notice a bad smell coming from the incision site.  You have swelling, redness, or drainage at the incision site.  You have more swelling or pain at the port site or the surrounding area.  You have a fever that is not controlled with medicine.  This information is not intended to replace advice given to you by your health care provider. Make sure you discuss any questions you have with your health care provider. Document  Released: 07/11/2005 Document Revised: 12/17/2015 Document Reviewed: 03/18/2013 Elsevier Interactive Patient Education  2017 [employer]  

## 2018-02-26 NOTE — Telephone Encounter (Signed)
Order for an elevated toilet seat signed by Judson Roch, NP and faxed to [facility]. dph

## 2018-02-26 NOTE — Progress Notes (Signed)
Hematology and Oncology Follow Up Visit  [name] [id number] 10-Nov-1950 67 y.o. 02/26/2018   Principle Diagnosis:  Stage IB (T1, N1, M0) adenocarcinoma of the stomach Iron deficiency anemia  Current Therapy:   Observation IV iron as indicated - last received in December 2016   Interim History:  [name] is here today for follow-up. She is having "shooting pains" from the front of [name] head to the back off and on for the last week or 2. She has also had some episodes of dizziness.  She had a fall recently while trying to get onto the sidewalk. 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  She has had no syncope and is now using [name] walker when ambulating for support.  She states that she needs a right knee replacement but has not yet followed up with [name] orthopedist Dr. Sharol Given. She requested an elevated toilet seat so [facility]  She has fatigue and c/o occasional palpitations.  No lymphadenopathy noted on exam.  No episodes of bleeding, no bruising or petechiae.  No fever, chills, n/v, cough, rash, SOB, chest pain, abdominal pain or changes in bowel or bladder habits.  No swelling in [name] extremities at this time.  She has a good appetite and is staying well hydrated. [name] weight is   ECOG Performance Status: 1 - Symptomatic but completely ambulatory  Medications:  Allergies as of 02/26/2018      Reactions   Adhesive [tape] Other (See Comments)   Burn Skin   Codeine Other (See Comments)   paranoid   Zocor [simvastatin - High Dose] Other (See Comments)   Muscle spasms   Penicillins Rash   Has patient had a PCN reaction causing immediate rash, facial/tongue/throat swelling, SOB or lightheadedness with hypotension: Yes Has patient had a PCN reaction causing severe rash involving mucus membranes or skin necrosis: No Has patient had a PCN reaction  that required hospitalization does not remember.  Has patient had a PCN reaction occurring within the last 10 years: No If all of the above answers are "NO", then may proceed with Cephalosporin use.      Medication List        Accurate as of 02/26/18 11:52 AM. Always use your most recent med list.          albuterol (2.5 MG/3ML) 0.083% nebulizer solution Commonly known as:  PROVENTIL Take 3 mLs (2.5 mg total) by nebulization every 6 (six) hours as needed for wheezing or shortness of breath.   albuterol 108 (90 Base) MCG/ACT inhaler Commonly known as:  PROVENTIL HFA;VENTOLIN HFA Inhale 2 puffs into the lungs every 6 (six) hours as needed for wheezing or shortness of breath.   aspirin 81 MG tablet Take 81 mg by mouth at bedtime.   B-D ASSURE BPM/MANUAL ARM CUFF Misc Check blood pressure daily.   DULoxetine 60 MG capsule Commonly known as:  CYMBALTA Take 1 capsule (60 mg total) by mouth 2 (two) times daily.   gabapentin 400 MG capsule Commonly known as:  NEURONTIN Take 2 capsules (800 mg total) by mouth 3 (three) times daily.   glimepiride 4 MG tablet Commonly known as:  AMARYL Take 1 tablet (4 mg total) by mouth 2 (two) times daily.   ipratropium-albuterol 0.5-2.5 (3) MG/3ML Soln Commonly known as:  DUONEB Take 3 mLs by nebulization every 6 (  six) hours as needed.   metFORMIN 500 MG tablet Commonly known as:  GLUCOPHAGE Take 1 tablet (500 mg total) by mouth 2 (two) times daily with a meal.   multivitamin with minerals Tabs tablet Take 1 tablet by mouth daily.   ONE TOUCH ULTRA 2 w/Device Kit   ONE TOUCH ULTRA TEST test strip Generic drug:  glucose blood   OT ULTRA/FASTTK CNTRL SOLN Soln   traMADol 50 MG tablet Commonly known as:  ULTRAM Take 1 tablet (50 mg total) by mouth 3 (three) times daily as needed for moderate pain.       Allergies:  Allergies  Allergen Reactions  . Adhesive [Tape] Other (See Comments)    Burn Skin  . Codeine Other (See Comments)     paranoid  . Zocor [Simvastatin - High Dose] Other (See Comments)    Muscle spasms  . Penicillins Rash    Has patient had a PCN reaction causing immediate rash, facial/tongue/throat swelling, SOB or lightheadedness with hypotension: Yes Has patient had a PCN reaction causing severe rash involving mucus membranes or skin necrosis: No Has patient had a PCN reaction that required hospitalization does not remember.  Has patient had a PCN reaction occurring within the last 10 years: No If all of the above answers are "NO", then may proceed with Cephalosporin use.     Past Medical History, Surgical history, Social history, and Family History were reviewed and updated.  Review of Systems: All other 10 point review of systems is negative.   Physical Exam:  weight is 218 lb (98.9 kg). [name] oral temperature is 98.2 F (36.8 C). [name] blood pressure is 120/77 and [name] pulse is 86. [name] respiration is 16 and oxygen saturation is 100%.   Wt Readings from Last 3 Encounters:  02/26/18 218 lb (98.9 kg)  12/05/17 199 lb 12.8 oz (90.6 kg)  11/03/17 218 lb 0.6 oz (98.9 kg)    Ocular: Sclerae unicteric, pupils equal, round and reactive to light Ear-nose-throat: Oropharynx clear, dentition fair Lymphatic: No cervical, supraclavicular or axillary adenopathy Lungs no rales or rhonchi, good excursion bilaterally Heart regular rate and rhythm, no murmur appreciated Abd soft, nontender, positive bowel sounds, no liver or spleen tip palpated on exam, no fluid wave MSK no focal spinal tenderness, no joint edema Neuro: non-focal, well-oriented, appropriate affect Breasts: Deferred   Lab Results  Component Value Date   WBC 8.1 02/26/2018   HGB 12.6 02/26/2018   HCT 38.1 02/26/2018   MCV 89.0 02/26/2018   PLT 321 02/26/2018   Lab Results  Component Value Date   FERRITIN 253 07/06/2017   IRON 44 07/06/2017   TIBC 234 (L) 07/06/2017   UIBC 190 07/06/2017   IRONPCTSAT 19 (L) 07/06/2017   Lab Results   Component Value Date   RETICCTPCT 1.5 04/28/2014   RBC 4.28 02/26/2018   RETICCTABS 59.9 04/28/2014   No results found for: KPAFRELGTCHN, LAMBDASER, KAPLAMBRATIO No results found for: IGGSERUM, IGA, IGMSERUM No results found for: Ronnald Ramp, A1GS, A2GS, Tillman Sers, SPEI   Chemistry      Component Value Date/Time   NA 144 11/07/2017 1813   NA 138 07/06/2017 0745   K 3.8 11/07/2017 1813   K 4.4 07/06/2017 0745   CL 108 11/07/2017 1813   CL 101 08/02/2016 1351   CO2 24 11/07/2017 1813   CO2 22 07/06/2017 0745   BUN 8 11/07/2017 1813   BUN 19.7 07/06/2017 0745   CREATININE 1.03 (H) 11/07/2017 1813  CREATININE 1.3 (H) 07/06/2017 0745      Component Value Date/Time   CALCIUM 8.9 11/07/2017 1813   CALCIUM 9.3 07/06/2017 0745   ALKPHOS 100 11/07/2017 1813   ALKPHOS 115 07/06/2017 0745   AST 32 11/07/2017 1813   AST 18 07/06/2017 0745   ALT 32 11/07/2017 1813   ALT 14 07/06/2017 0745   BILITOT 0.9 11/07/2017 1813   BILITOT 0.30 07/06/2017 0745      Impression and Plan: [name] is a pleasant 67 yo African American female with history of stage IB stomach cancer. She had the tumor resected and completed adjuvant radiation and chemotherapy in 2010.  She has done well and so far there has been no evidence of recurrence.  CT scan of chest , abdomen and pelvis was performed in April (for pneumonia) showed a fatty liver but no evidence of malignancy.  She is concerned about these new "shooting pains" in [name] head. We will get a CT of the head to further evaluate.  Elevated toilet seat order sent to [facility].  We will go ahead and plan to see [name] back in another 3 months for follow-up.  She will contact our office with any questions or concerns. We can certainly see [name] sooner if need be.   Laverna Peace, NP 8/5/201911:52 AM

## 2018-02-27 LAB — IRON AND TIBC
Iron: 63 ug/dL (ref 41–142)
SATURATION RATIOS: 24 % (ref 21–57)
TIBC: 269 ug/dL (ref 236–444)
UIBC: 205 ug/dL

## 2018-02-27 LAB — FERRITIN: FERRITIN: 252 ng/mL (ref 11–307)

## 2018-03-09 ENCOUNTER — Other Ambulatory Visit: Payer: Self-pay | Admitting: Family Medicine

## 2018-03-09 ENCOUNTER — Telehealth: Payer: Self-pay

## 2018-03-09 MED ORDER — CLINDAMYCIN HCL 150 MG PO CAPS: 150.0000 mg | ORAL_CAPSULE | Freq: Three times a day (TID) | ORAL | 0 refills | Status: DC

## 2018-03-09 NOTE — Telephone Encounter (Signed)
rx sent in 

## 2018-03-09 NOTE — Telephone Encounter (Signed)
Pt. Requesting abx rx for reported abscessed tooth, unable to come in for OV d/t transportation. Routed to [name] to review.   Copied from [place] [phone]. Topic: General - Other >> Mar 09, 2018 10:43 AM Yvette Rack wrote: Reason for CRM: Pt states she does not have any transportation to come in and she would like a prescription written for an abscess on her tooth. Pt requests call back. Cb# [phone]

## 2018-03-10 ENCOUNTER — Ambulatory Visit (HOSPITAL_BASED_OUTPATIENT_CLINIC_OR_DEPARTMENT_OTHER): Admission: RE | Admit: 2018-03-10 | Payer: Medicare HMO | Source: Ambulatory Visit

## 2018-03-19 ENCOUNTER — Ambulatory Visit (HOSPITAL_BASED_OUTPATIENT_CLINIC_OR_DEPARTMENT_OTHER)
Admission: RE | Admit: 2018-03-19 | Discharge: 2018-03-19 | Disposition: A | Discharge status: Home / Self Care | Payer: Medicare HMO | Source: Ambulatory Visit | Attending: Family | Admitting: Family

## 2018-03-19 ENCOUNTER — Encounter (HOSPITAL_BASED_OUTPATIENT_CLINIC_OR_DEPARTMENT_OTHER): Payer: Self-pay

## 2018-03-19 DIAGNOSIS — G4452 New daily persistent headache (NDPH): Secondary | ICD-10-CM | POA: Diagnosis not present

## 2018-03-19 DIAGNOSIS — R42 Dizziness and giddiness: Secondary | ICD-10-CM | POA: Insufficient documentation

## 2018-03-19 DIAGNOSIS — C162 Malignant neoplasm of body of stomach: Secondary | ICD-10-CM | POA: Diagnosis not present

## 2018-03-19 MED ORDER — IOPAMIDOL (ISOVUE-300) INJECTION 61%
100.0000 mL | Freq: Once | INTRAVENOUS | Status: AC | PRN
  Administered 2018-03-19: 80 mL via INTRAVENOUS

## 2018-03-20 ENCOUNTER — Encounter: Payer: Self-pay | Admitting: *Deleted

## 2018-03-28 ENCOUNTER — Other Ambulatory Visit: Payer: Self-pay | Admitting: *Deleted

## 2018-03-31 IMAGING — CT CT ABD-PELV W/ CM
2 of 5 series · 15 of 46 positions shown, 17 images · IV contrast (ISOVUE)
Comparison: 09/24/2016, 03/22/2016

CLINICAL DATA: 65-year-old female with a history of chest
tightness, nausea.

Patient has a history of prior gastric cancer treated with surgery
EXAM:
CT ABDOMEN AND PELVIS WITH CONTRAST
TECHNIQUE: Multidetector CT imaging of the abdomen and pelvis was performed
using the standard protocol following bolus administration of
intravenous contrast.
CONTRAST:  <See Chart> [id number] IOPAMIDOL ([id number]) INJECTION
61%

[Series 2: abd/pel with · axial · 0.97mm/px · z∈[+1073,+1468]mm · 12 of 93 slices shown, 14 images]
[im 7/93  soft-tissue]
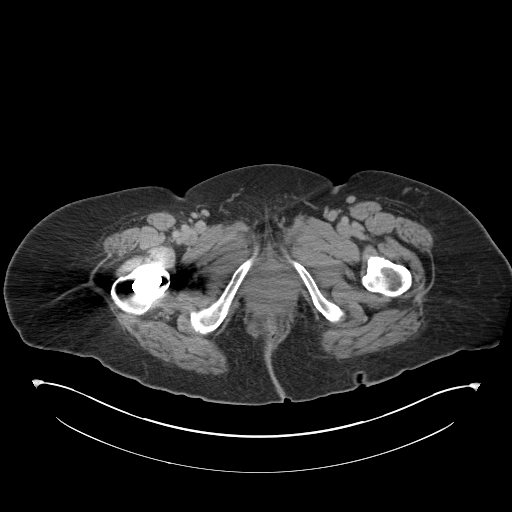
[im 7/93  bone]
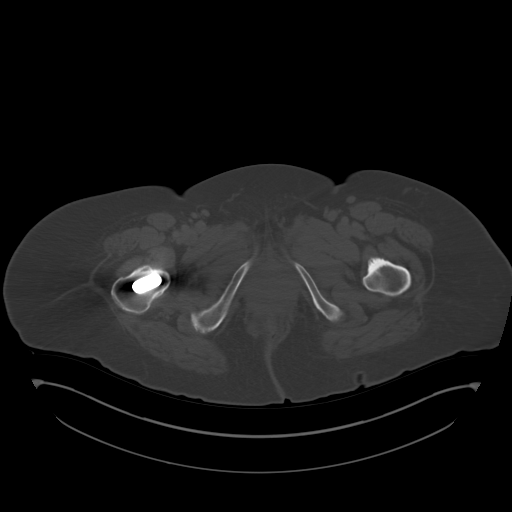
[im 13/93  soft-tissue]
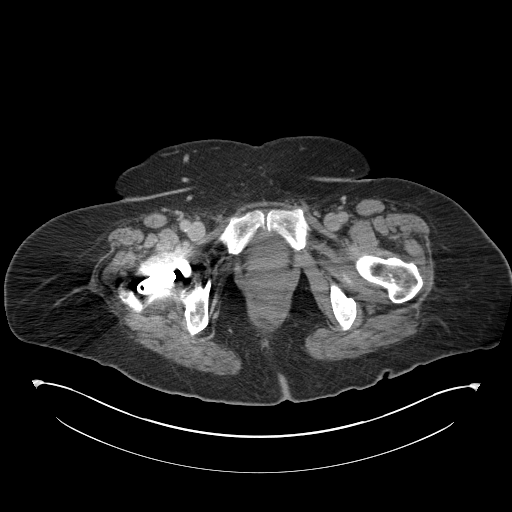
[im 19/93  soft-tissue]
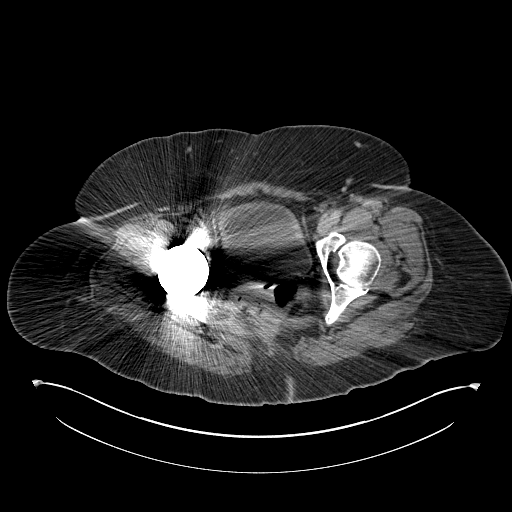
[im 31/93  soft-tissue]
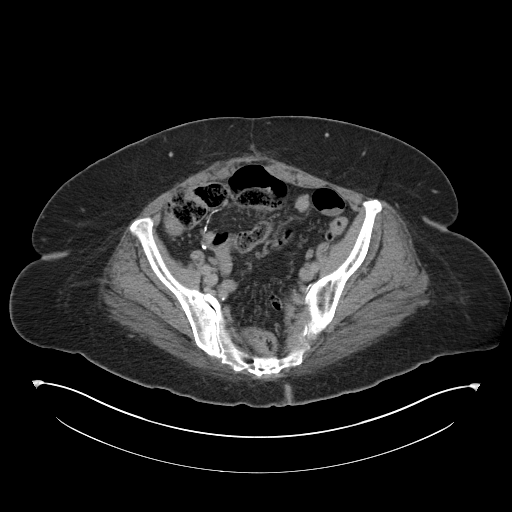
[im 37/93  soft-tissue]
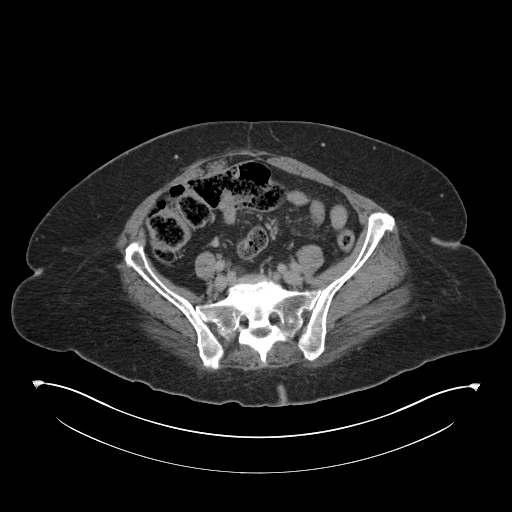
[im 43/93  soft-tissue]
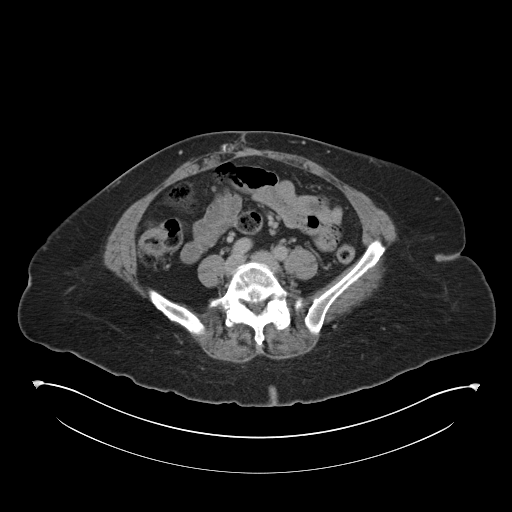
[im 50/93  soft-tissue]
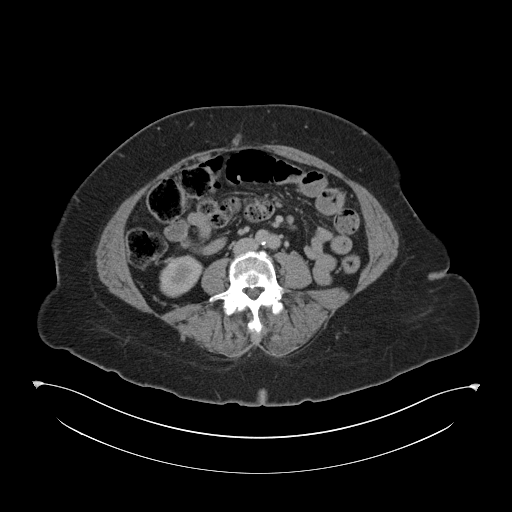
[im 56/93  soft-tissue]
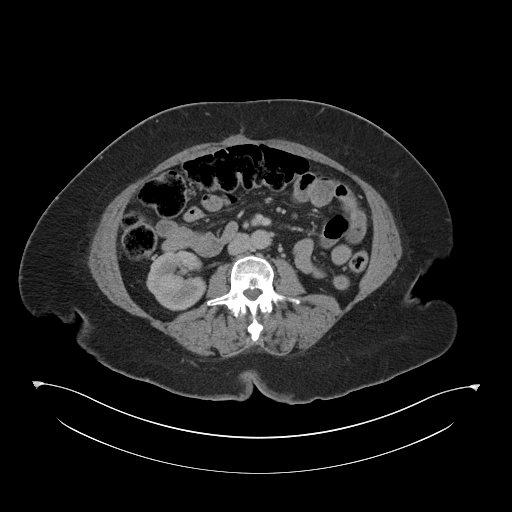
[im 62/93  soft-tissue]
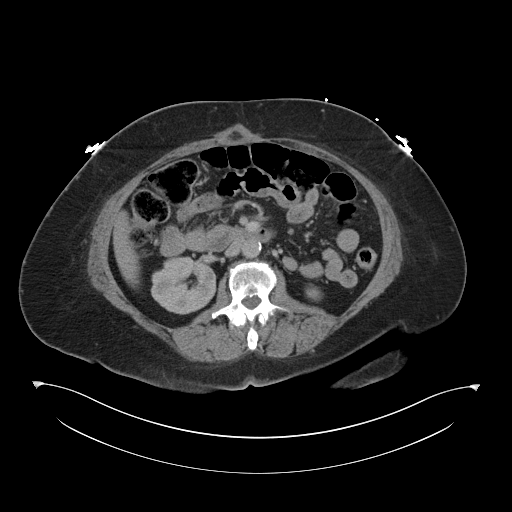
[im 62/93  bone]
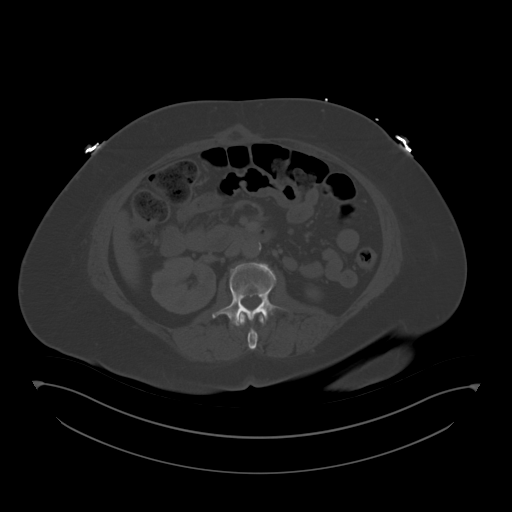
[im 74/93  soft-tissue]
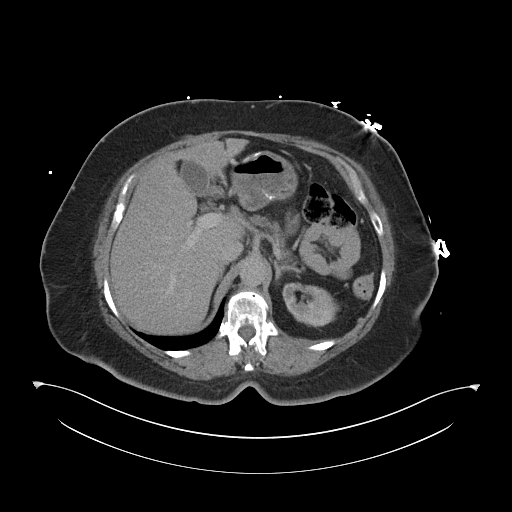
[im 80/93  soft-tissue]
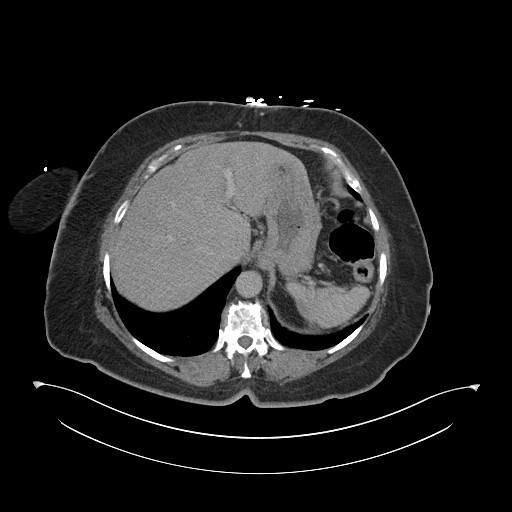
[im 86/93  soft-tissue]
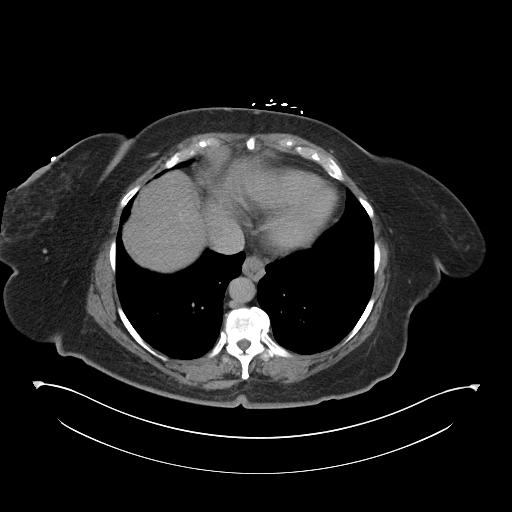

[Series 4: coronal a/|p · coronal · 0.74mm/px · 3 of 140 slices shown]
[im 47/140  soft-tissue]
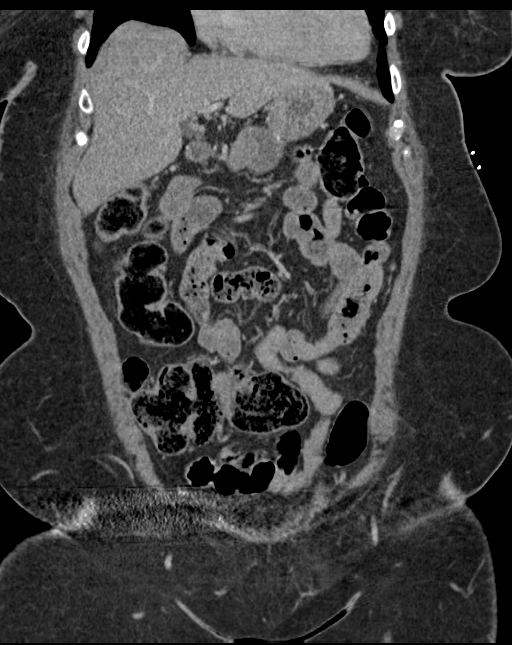
[im 62/140  soft-tissue]
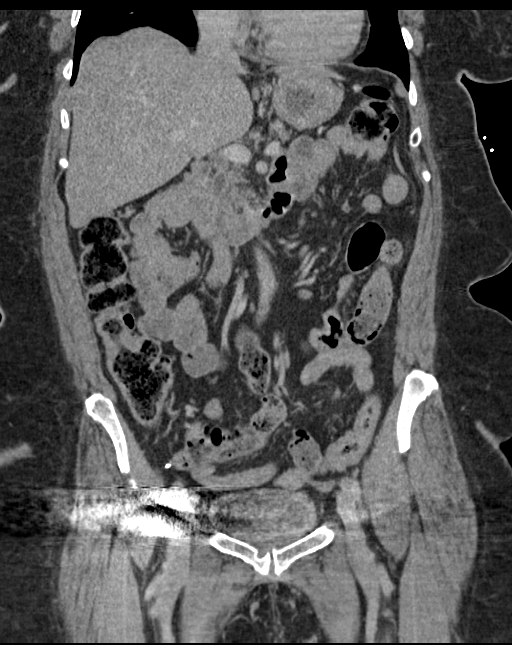
[im 78/140  soft-tissue]
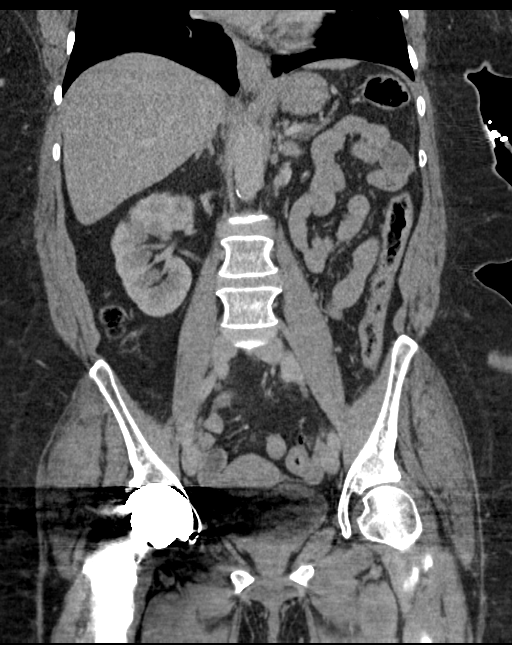

[15 of 46 positions shown; findings below may reference images not displayed]

FINDINGS: Lower chest: No acute abnormality.

Hiatal hernia

Hepatobiliary: Unremarkable appearance of liver.

Unremarkable gallbladder.

Pancreas: Atrophic pancreas with no inflammatory changes or
calcifications.

Spleen: Unremarkable spleen

Adrenals/Urinary Tract: Unremarkable bilateral adrenal glands.

No hydronephrosis or nephrolithiasis. Unremarkable course the
bilateral ureters. Unremarkable urinary bladder.

Stomach/Bowel: Surgical changes of the stomach compatible with
gastrojejunostomy and a distal antrectomy.

Patent anastomosis at the gastrojejunostomy. Partial fecalization of
small bowel loops within the mid abdomen and the left abdomen. No
transition point identified. No dilated small bowel. No inflammatory
changes.

Surgical changes of appendectomy.

Moderate stool burden with formed stool within the right colon and
transverse colon. Colonic diverticula. No associated inflammatory
changes.

Vascular/Lymphatic: Scattered calcifications of the abdominal aorta.
No aneurysm.

Reproductive: Unremarkable appearance of pelvic organs.

Other: Surgical changes along the midline abdomen. Soft tissue
thickening along the midline abdomen, likely postoperative changes.
No abdominal wall hernia.

Musculoskeletal: Surgical changes of right hip arthroplasty. No
displaced fracture. Degenerative changes of the lumbar spine.
IMPRESSION: No acute finding of the abdomen/pelvis CT.

No evidence of bowel obstruction, however, there is partial
fecalization of short-segment bowel loops in the central abdomen and
left abdomen. This finding can be seen with slow transit of
material, potentially related to chronic pain medication usage.
Correlation with a history of constipation and the narcotic pain
medicines may be useful.

Surgical changes of prior partial gastrectomy and gastrojejunostomy
with no evidence of associated inflammatory changes.

Diverticular disease without evidence of acute diverticulitis.

Pancreatic atrophy, typically seen with longstanding diabetes.

Hiatal hernia

## 2018-03-31 IMAGING — DX DG CHEST 2V
2 series · 2 of 2 positions shown · non-contrast
Comparison: 02/22/2016

CLINICAL DATA: Mid chest pain today, history diabetes mellitus,
asthma, hypertension, sickle trait

EXAM:
CHEST  2 VIEW

[chest pa]
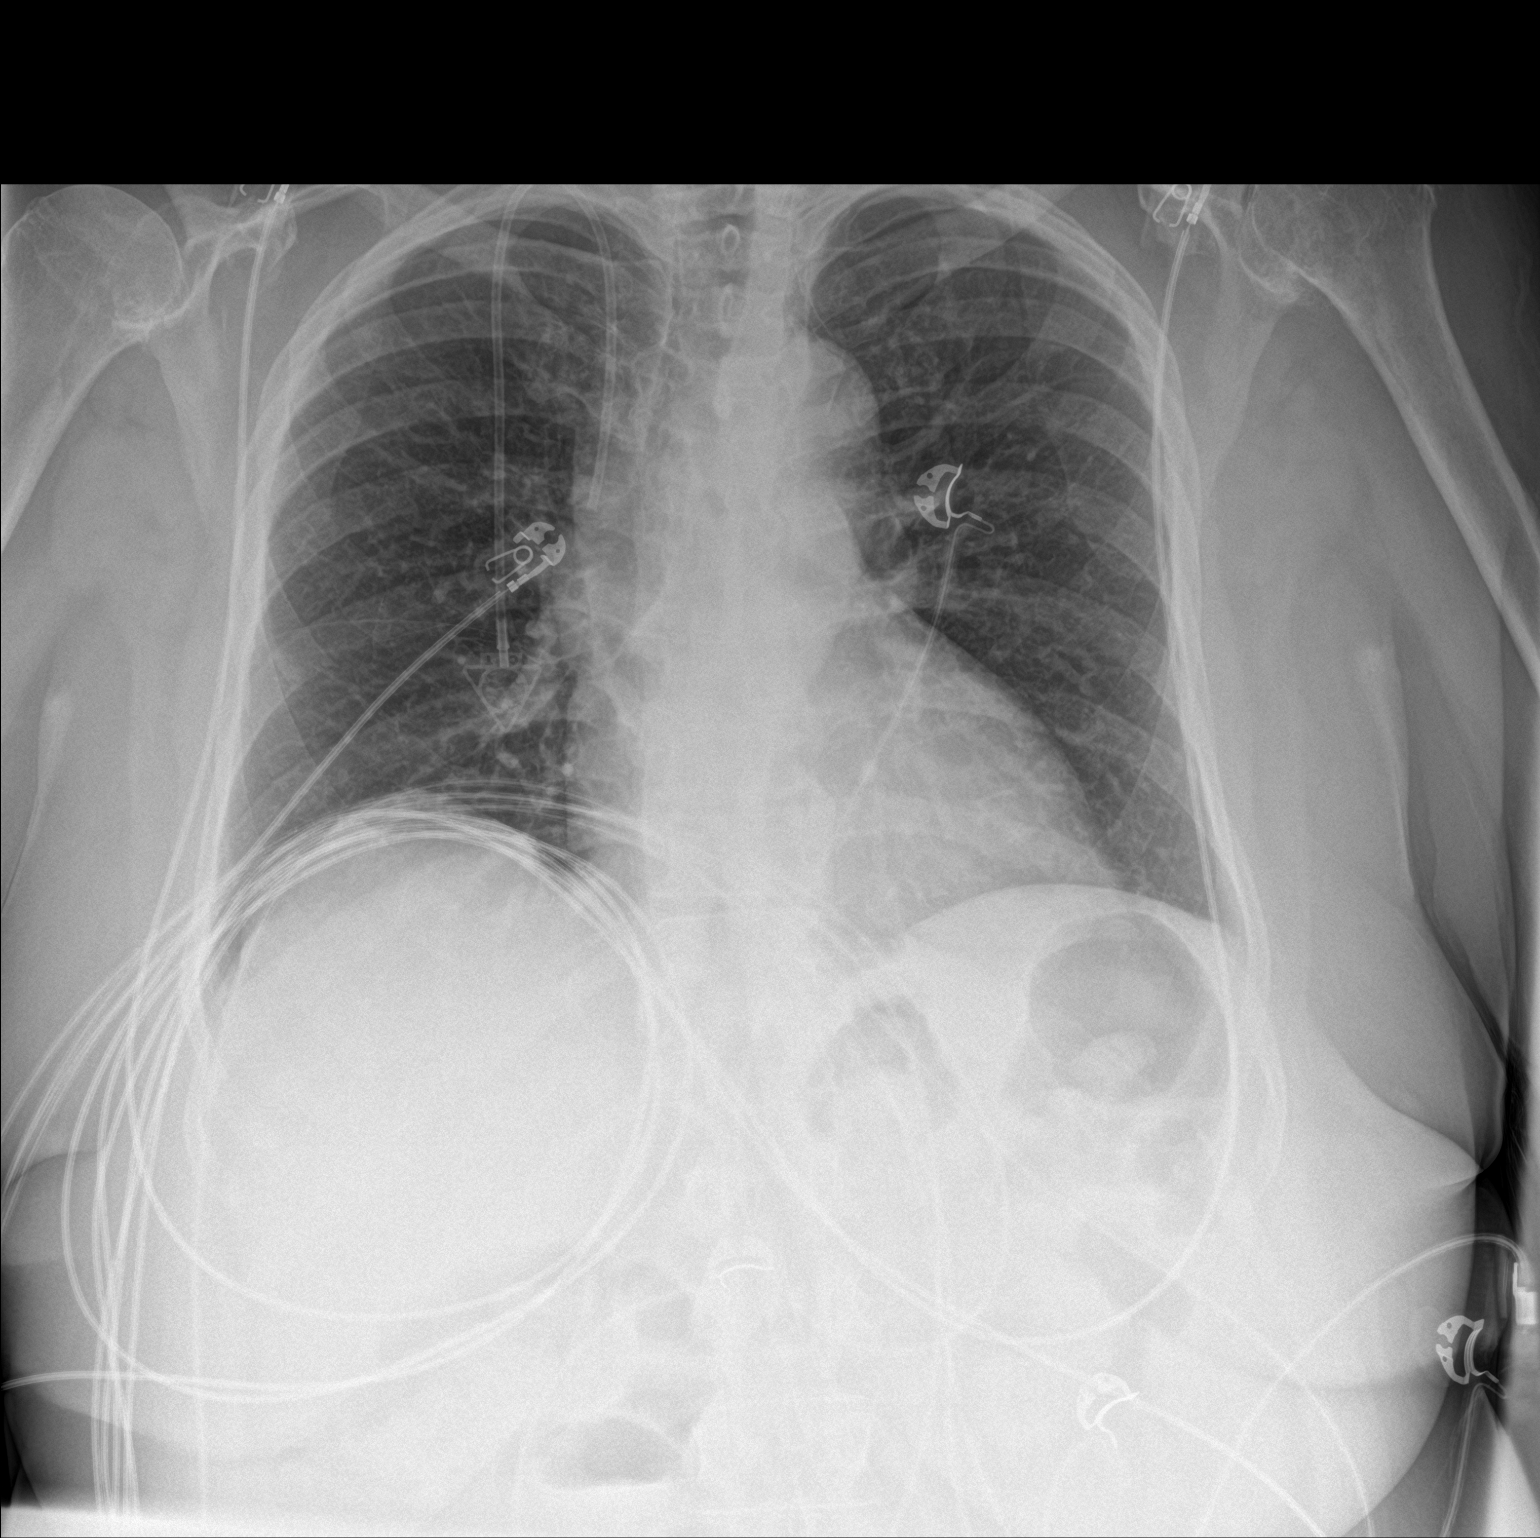

[chest lat]
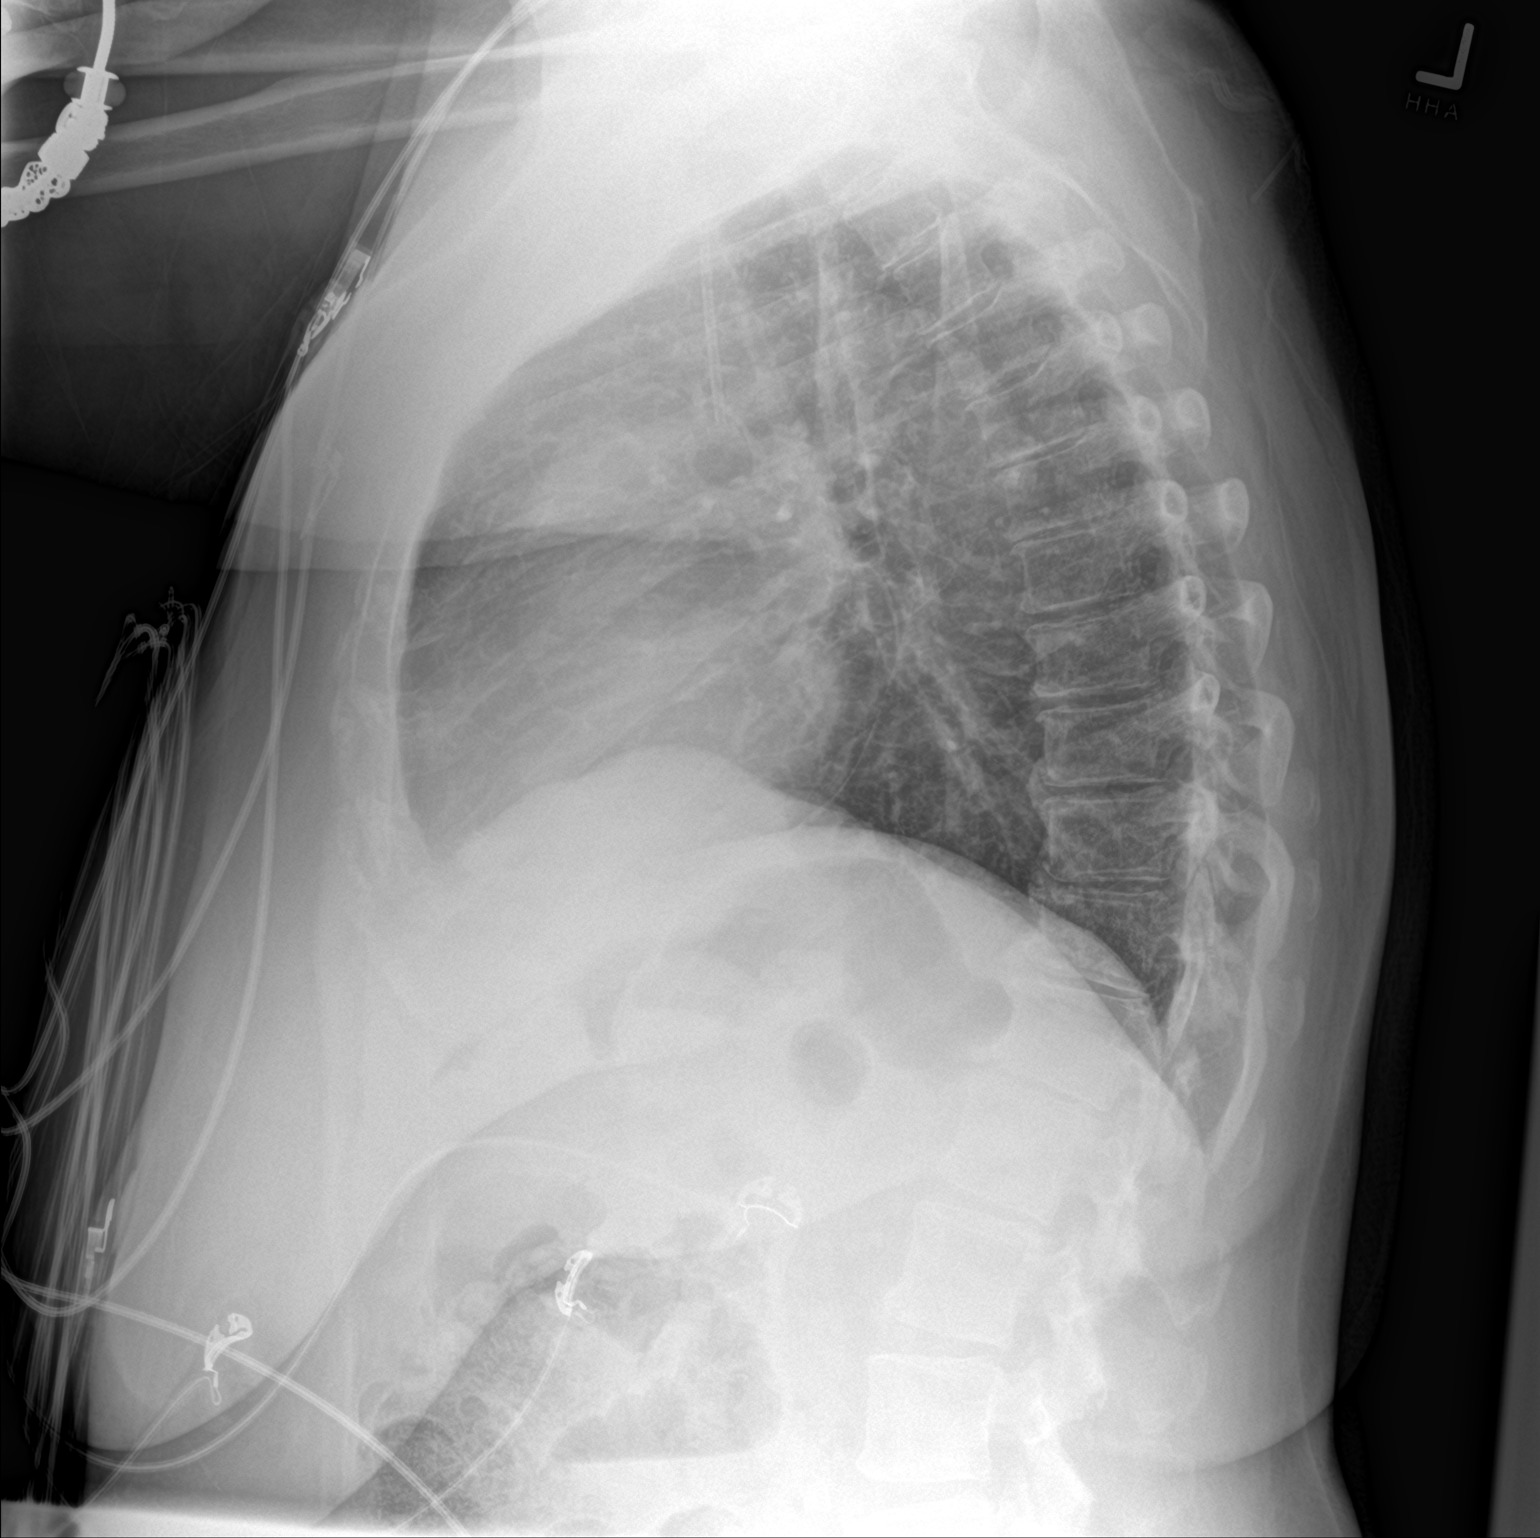

[2 of 2 positions shown; findings below may reference images not displayed]

FINDINGS: RIGHT jugular Port-A-Cath with tip projecting over SVC.

Upper normal heart size.

Tortuous aorta.

Mediastinal contours and pulmonary vascularity normal.

Lungs clear.

No pleural effusion or pneumothorax.

No acute osseous findings.
IMPRESSION: No acute abnormalities.

## 2018-04-02 ENCOUNTER — Telehealth: Payer: Self-pay | Admitting: Family Medicine

## 2018-04-02 NOTE — Telephone Encounter (Signed)
If on it for pain and depression there is not another one that is as effective----- anyway we can see if she can get it from drug company?

## 2018-04-02 NOTE — Telephone Encounter (Signed)
Copied from [place] [phone]. Topic: Quick Communication - See Telephone Encounter >> Apr 02, 2018 11:48 AM Gardiner Ramus wrote: CRM for notification. See Telephone encounter for: 04/02/18. Pt called and stated that DULoxetine (CYMBALTA) 60 MG capsule [[id number]] is to expensive  and would like something more affordable called in. Please advise

## 2018-04-02 NOTE — Telephone Encounter (Signed)
[facility], requesting "something different from duloxetine" due to cost per pt. Request. Routed to [name] to advise.

## 2018-04-03 NOTE — Telephone Encounter (Signed)
She needs to dec the med to 1 a day for a few weeks and then we will probably need to cut dose to 30 mg after that Has she tried zoloft before?   If no--- zoloft 50 mg #30  1 po qd 2 refills with ov in 1 month to f/u

## 2018-04-03 NOTE — Telephone Encounter (Signed)
Patient states that she takes it for anxiety.  It costs $47 a month and $141 for 3 months.  She is willing to try something else if possible.

## 2018-04-04 MED ORDER — SERTRALINE HCL 50 MG PO TABS: 50.0000 mg | ORAL_TABLET | Freq: Every day | ORAL | 2 refills | Status: DC

## 2018-04-04 NOTE — Telephone Encounter (Signed)
Patient notified

## 2018-04-04 NOTE — Telephone Encounter (Signed)
Patient is out of the cymbalta and has been out for a few days.  She will have to pay at least 2 more copays for the cymbalta since they are capsules to decrease down to 75m.  Any recommendations?

## 2018-04-04 NOTE — Telephone Encounter (Signed)
zoloft 50 mg  #30  1/2 po qd x 1 week then take 1 po qd F/u 6-8 weeks or sooner if needed

## 2018-04-09 ENCOUNTER — Ambulatory Visit (INDEPENDENT_AMBULATORY_CARE_PROVIDER_SITE_OTHER): Payer: Medicare HMO | Admitting: Family Medicine

## 2018-04-09 ENCOUNTER — Encounter: Payer: Self-pay | Admitting: Family Medicine

## 2018-04-09 ENCOUNTER — Other Ambulatory Visit: Payer: Medicare HMO

## 2018-04-09 VITALS — BP 122/72 | HR 98 | Temp 98.8°F | Resp 16 | Ht 67.0 in | Wt 218.4 lb

## 2018-04-09 DIAGNOSIS — G894 Chronic pain syndrome: Secondary | ICD-10-CM | POA: Diagnosis not present

## 2018-04-09 DIAGNOSIS — E785 Hyperlipidemia, unspecified: Secondary | ICD-10-CM | POA: Diagnosis not present

## 2018-04-09 DIAGNOSIS — E1165 Type 2 diabetes mellitus with hyperglycemia: Secondary | ICD-10-CM

## 2018-04-09 DIAGNOSIS — E1151 Type 2 diabetes mellitus with diabetic peripheral angiopathy without gangrene: Secondary | ICD-10-CM

## 2018-04-09 DIAGNOSIS — E1121 Type 2 diabetes mellitus with diabetic nephropathy: Secondary | ICD-10-CM

## 2018-04-09 DIAGNOSIS — E1169 Type 2 diabetes mellitus with other specified complication: Secondary | ICD-10-CM | POA: Diagnosis not present

## 2018-04-09 DIAGNOSIS — IMO0002 Reserved for concepts with insufficient information to code with codable children: Secondary | ICD-10-CM

## 2018-04-09 DIAGNOSIS — Z23 Encounter for immunization: Secondary | ICD-10-CM

## 2018-04-09 DIAGNOSIS — I10 Essential (primary) hypertension: Secondary | ICD-10-CM

## 2018-04-09 DIAGNOSIS — N183 Chronic kidney disease, stage 3 unspecified: Secondary | ICD-10-CM

## 2018-04-09 LAB — COMPREHENSIVE METABOLIC PANEL
ALK PHOS: 126 U/L — AB (ref 39–117)
ALT: 13 U/L (ref 0–35)
AST: 13 U/L (ref 0–37)
Albumin: 3.9 g/dL (ref 3.5–5.2)
BUN: 27 mg/dL — AB (ref 6–23)
CO2: 33 meq/L — AB (ref 19–32)
CREATININE: 1.36 mg/dL — AB (ref 0.40–1.20)
Calcium: 9.8 mg/dL (ref 8.4–10.5)
Chloride: 101 mEq/L (ref 96–112)
GFR: 50.02 mL/min — AB (ref >60.00)
GLUCOSE: 336 mg/dL — AB (ref 70–99)
Potassium: 5.3 mEq/L — ABNORMAL HIGH (ref 3.5–5.1)
Sodium: 138 mEq/L (ref 135–145)
TOTAL PROTEIN: 7.1 g/dL (ref 6.0–8.3)
Total Bilirubin: 0.4 mg/dL (ref 0.2–1.2)

## 2018-04-09 LAB — POC URINALSYSI DIPSTICK (AUTOMATED)
Bilirubin, UA: NEGATIVE
Blood, UA: NEGATIVE
Glucose, UA: NEGATIVE
Ketones, UA: NEGATIVE
LEUKOCYTES UA: NEGATIVE
NITRITE UA: NEGATIVE
PH UA: 6 (ref 5.0–8.0)
PROTEIN UA: NEGATIVE
Spec Grav, UA: 1.025 (ref 1.010–1.025)
Urobilinogen, UA: 0.2 E.U./dL

## 2018-04-09 LAB — LIPID PANEL
CHOL/HDL RATIO: 4
Cholesterol: 189 mg/dL (ref 0–200)
HDL: 50.8 mg/dL (ref >39.00)
LDL CALC: 102 mg/dL — AB (ref 0–99)
NONHDL: 138.62
Triglycerides: 182 mg/dL — ABNORMAL HIGH (ref 0.0–149.0)
VLDL: 36.4 mg/dL (ref 0.0–40.0)

## 2018-04-09 LAB — MICROALBUMIN / CREATININE URINE RATIO
Creatinine,U: 95.7 mg/dL
Microalb Creat Ratio: 1 mg/g (ref 0.0–30.0)
Microalb, Ur: 1 mg/dL (ref 0.0–1.9)

## 2018-04-09 MED ORDER — TRAMADOL HCL 50 MG PO TABS: 50.0000 mg | ORAL_TABLET | Freq: Three times a day (TID) | ORAL | 0 refills | Status: DC | PRN

## 2018-04-09 MED ORDER — GLIMEPIRIDE 4 MG PO TABS: 4.0000 mg | ORAL_TABLET | Freq: Two times a day (BID) | ORAL | 1 refills | Status: DC

## 2018-04-09 NOTE — Progress Notes (Signed)
Patient ID: [name], female    DOB: [birth year]  Age: 67 y.o. MRN: [id number]    Subjective:  Subjective  HPI CHENA CHOHAN presents for f/u dm, chol and bp.  She also needs a new referral to nephrology because it was over a year since she has been there.   She is requesting her flu and pneum vaccine. She is also c/o itching place over R eye x few days No eye d/c or redness   Review of Systems  Constitutional: Negative for appetite change, diaphoresis, fatigue and unexpected weight change.  Eyes: Positive for itching. Negative for pain, redness and visual disturbance.  Respiratory: Negative for cough, chest tightness, shortness of breath and wheezing.   Cardiovascular: Negative for chest pain, palpitations and leg swelling.  Endocrine: Negative for cold intolerance, heat intolerance, polydipsia, polyphagia and polyuria.  Genitourinary: Positive for flank pain. Negative for difficulty urinating, dysuria and frequency.  Neurological: Negative for dizziness, light-headedness, numbness and headaches.  Psychiatric/Behavioral: Negative.     History Past Medical History:  Diagnosis Date  . Asthma   . Degenerative joint disease   . Diabetes mellitus   . Dyspnea   . GERD (gastroesophageal reflux disease)   . Hyperlipidemia    no longer on medication for this  . Hypertension    patient denies  . Neuropathy    bilateral hands and feet  . Sickle cell trait ([place])   . Sleep apnea   . stomach ca dx'd 2010   chemo/xrt comp 05/2009  . TIA (transient ischemic attack) 07/2015    She has a past surgical history that includes Total knee arthroplasty; Total hip arthroplasty; Shoulder surgery; Appendectomy; Cesarean section; Hernia repair; Esophagogastroduodenoscopy (N/A, 10/15/2012); Esophagogastroduodenoscopy (N/A, 01/15/2016); Colon surgery; and Colonoscopy with propofol (N/A, 06/01/2016).   Her family history includes Alzheimer's disease (age of onset: 9) in her father; Cancer in her  maternal grandfather; Cancer (age of onset: 50) in her mother; Diabetes in her brother and maternal grandmother.She reports that she quit smoking about 29 years ago. Her smoking use included cigarettes. She started smoking about 44 years ago. She has a 15.00 pack-year smoking history. She has never used smokeless tobacco. She reports that she does not drink alcohol or use drugs.  Current Outpatient Medications on File Prior to Visit  Medication Sig Dispense Refill  . albuterol (PROVENTIL HFA;VENTOLIN HFA) 108 (90 Base) MCG/ACT inhaler Inhale 2 puffs into the lungs every 6 (six) hours as needed for wheezing or shortness of breath. 1 Inhaler 2  . albuterol (PROVENTIL) (2.5 MG/3ML) 0.083% nebulizer solution Take 3 mLs (2.5 mg total) by nebulization every 6 (six) hours as needed for wheezing or shortness of breath. 75 mL 12  . aspirin 81 MG tablet Take 81 mg by mouth at bedtime.    . gabapentin (NEURONTIN) 400 MG capsule Take 2 capsules (800 mg total) by mouth 3 (three) times daily. 180 capsule 7  . ipratropium-albuterol (DUONEB) 0.5-2.5 (3) MG/3ML SOLN Take 3 mLs by nebulization every 6 (six) hours as needed. (Patient taking differently: Take 3 mLs by nebulization every 6 (six) hours as needed (sob and wheezing). ) 360 mL 2  . metFORMIN (GLUCOPHAGE) 500 MG tablet Take 1 tablet (500 mg total) by mouth 2 (two) times daily with a meal. 180 tablet 3  . Multiple Vitamin (MULTIVITAMIN WITH MINERALS) TABS tablet Take 1 tablet by mouth daily.    . sertraline (ZOLOFT) 50 MG tablet Take 1 tablet (50 mg total) by mouth daily.  30 tablet 2   Current Facility-Administered Medications on File Prior to Visit  Medication Dose Route Frequency Provider Last Rate Last Dose  . sodium chloride flush (NS) 0.9 % injection 10 mL  10 mL Intravenous PRN [place], Holli Humbles, NP   10 mL at 09/13/16 1146     Objective:  Objective  Physical Exam  Constitutional: She is oriented to person, place, and time. She appears  well-developed and well-nourished.  HENT:  Head: Normocephalic and atraumatic.    Eyes: Conjunctivae and EOM are normal.  Neck: Normal range of motion. Neck supple. No JVD present. Carotid bruit is not present. No thyromegaly present.  Cardiovascular: Normal rate, regular rhythm and normal heart sounds.  No murmur heard. Pulmonary/Chest: Effort normal and breath sounds normal. No respiratory distress. She has no wheezes. She has no rales. She exhibits no tenderness.  Musculoskeletal: She exhibits no edema, tenderness or deformity.  Neurological: She is alert and oriented to person, place, and time.  Psychiatric: She has a normal mood and affect.  Nursing note and vitals reviewed.  BP 122/72 (BP Location: Right Arm, Cuff Size: Large)   Pulse 98   Temp 98.8 F (37.1 C) (Oral)   Resp 16   Ht 5' 7"  (1.702 m)   Wt 218 lb 6.4 oz (99.1 kg)   SpO2 94%   BMI 34.21 kg/m  Wt Readings from Last 3 Encounters:  04/09/18 218 lb 6.4 oz (99.1 kg)  02/26/18 218 lb (98.9 kg)  12/05/17 199 lb 12.8 oz (90.6 kg)     Lab Results  Component Value Date   WBC 8.1 02/26/2018   HGB 12.6 02/26/2018   HCT 38.1 02/26/2018   PLT 321 02/26/2018   GLUCOSE 151 (H) 02/26/2018   CHOL 170 12/05/2017   TRIG 137.0 12/05/2017   HDL 73.10 12/05/2017   LDLCALC 69 12/05/2017   ALT 16 02/26/2018   AST 20 02/26/2018   NA 147 (H) 02/26/2018   K 4.6 02/26/2018   CL 107 02/26/2018   CREATININE 1.10 02/26/2018   BUN 18 02/26/2018   CO2 29 02/26/2018   TSH 3.061 05/13/2016   INR 0.98 11/05/2010   HGBA1C 6.8 (H) 11/03/2017   MICROALBUR 0.7 06/21/2016    Ct Head W Wo Contrast  Result Date: 03/19/2018 CLINICAL DATA:  New daily headache.  History of gastric carcinoma. EXAM: CT HEAD WITHOUT AND WITH CONTRAST TECHNIQUE: Contiguous axial images were obtained from the base of the skull through the vertex without and with intravenous contrast CONTRAST:  26m ISOVUE-300 IOPAMIDOL (ISOVUE-300) INJECTION 61% COMPARISON:   02/03/2016 noncontrast head CT FINDINGS: Brain: No evidence of mass or edema. No acute infarct, hemorrhage, hydrocephalus, or collection. Small remote right cerebellar infarct, in retrospect also present in 2017 Vascular: No hyperdense vessel or unexpected calcification. Visible vessels are patent. Skull: No evidence of metastasis Sinuses/Orbits: Small polyp or retention cyst in the anterior right sphenoid sinus. No fluid level. IMPRESSION: No acute finding or evidence of metastatic disease. Electronically Signed   By: JMonte FantasiaM.D.   On: 03/19/2018 13:56     Assessment & Plan:  Plan  I have discontinued MZarra Geffert Meiser's B-D ASSURE BPM/MANUAL ARM CUFF, OT ULTRA/FASTTK CNTRL SOLN, ONE TOUCH ULTRA 2, ONE TOUCH ULTRA TEST, DULoxetine, and clindamycin. I am also having her maintain her multivitamin with minerals, aspirin, ipratropium-albuterol, gabapentin, albuterol, albuterol, metFORMIN, sertraline, traMADol, and glimepiride.  Meds ordered this encounter  Medications  . traMADol (ULTRAM) 50 MG tablet    Sig:  Take 1 tablet (50 mg total) by mouth 3 (three) times daily as needed for moderate pain.    Dispense:  90 tablet    Refill:  0  . glimepiride (AMARYL) 4 MG tablet    Sig: Take 1 tablet (4 mg total) by mouth 2 (two) times daily.    Dispense:  180 tablet    Refill:  1    Problem List Items Addressed This Visit      Unprioritized   Chronic pain syndrome (Chronic)    stable      Relevant Medications   traMADol (ULTRAM) 50 MG tablet   CKD (chronic kidney disease) stage 3, GFR 30-59 ml/min (HCC)    Check labs F/u nephrology      Relevant Orders   Ambulatory referral to Nephrology   Essential hypertension    Well controlled, no changes to meds. Encouraged heart healthy diet such as the DASH diet and exercise as tolerated.       Relevant Orders   POCT Urinalysis Dipstick (Automated) (Completed)   Hemoglobin A1c   Comprehensive metabolic panel   Microalbumin / creatinine  urine ratio   Lipid panel   Hyperlipidemia associated with type 2 diabetes mellitus ([place])    Tolerating statin, encouraged heart healthy diet, avoid trans fats, minimize simple carbs and saturated fats. 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)   Relevant Medications   glimepiride (AMARYL) 4 MG tablet   Other Relevant Orders   POCT Urinalysis Dipstick (Automated) (Completed)   Hemoglobin A1c   Comprehensive metabolic panel   Microalbumin / creatinine urine ratio   Lipid panel    Other Visit Diagnoses    DM (diabetes mellitus) type II uncontrolled, periph vascular disorder (HCC)       Relevant Medications   glimepiride (AMARYL) 4 MG tablet   Influenza vaccine administered       Relevant Orders   Flu vaccine HIGH DOSE PF (Fluzone High dose) (Completed)   Need for pneumococcal vaccination       Relevant Orders   Pneumococcal conjugate vaccine 13-valent (Completed)      Follow-up: Return in about 6 months (around 10/08/2018).  Ann Held, DO

## 2018-04-09 NOTE — Assessment & Plan Note (Signed)
Well controlled, no changes to meds. Encouraged heart healthy diet such as the DASH diet and exercise as tolerated.  °

## 2018-04-09 NOTE — Patient Instructions (Signed)

## 2018-04-09 NOTE — Assessment & Plan Note (Signed)
stable °

## 2018-04-09 NOTE — Assessment & Plan Note (Signed)
Tolerating statin, encouraged heart healthy diet, avoid trans fats, minimize simple carbs and saturated fats. Increase exercise as tolerated 

## 2018-04-09 NOTE — Assessment & Plan Note (Signed)
Check labs F/u nephrology 

## 2018-04-10 LAB — HEMOGLOBIN A1C
EAG (MMOL/L): 17.3 (calc)
HEMOGLOBIN A1C: 12.5 %{Hb} — AB (ref <5.7)
Mean Plasma Glucose: 312 (calc)

## 2018-04-13 ENCOUNTER — Telehealth: Payer: Self-pay | Admitting: Family Medicine

## 2018-04-13 NOTE — Telephone Encounter (Signed)
Called the patient and she is having a little cough, no fever, stopped up nose and scratchy throat---all symptoms started on Wednesday. Patient is currently not taking any OTC medications

## 2018-04-13 NOTE — Telephone Encounter (Signed)
claritin 10 mg daily  flonase 2 sprays each nostril q d

## 2018-04-13 NOTE — Telephone Encounter (Signed)
Called the patient informed of PCP instructions. She verbalized understanding.

## 2018-04-13 NOTE — Telephone Encounter (Signed)
Copied from [place] [id number]. Topic: General - Other >> Apr 13, 2018  1:28 PM Bea Graff, NT wrote: Reason for CRM: Pt calling and states she had the flu shot and pneumonia shot on Monday and now has a scratchy throat and runny nose. She wants to see if something can be called in to help her without an appt.

## 2018-04-16 ENCOUNTER — Telehealth: Payer: Self-pay | Admitting: Family Medicine

## 2018-04-16 NOTE — Telephone Encounter (Signed)
Copied from [place] [id number]. Topic: General - Other >> Apr 16, 2018 11:41 AM [name], NT wrote: Reason for CRM: Patient called back and states she had a 101 fever over the weekend she states she is not taking anything ,her  fever is going down on its own , and she has no appetite and also she wants to know if something can be called in or does she need to come in to be seen ,and she only wants Dr [name] 's  nurse at the actual practice to return her call , please call [phone]

## 2018-04-17 NOTE — Telephone Encounter (Signed)
Just fever ?  Should could have the flu-- - need more details but she probably needs to be seen If it is the flu she should be seen tomorrow

## 2018-04-18 MED ORDER — FLUTICASONE PROPIONATE 50 MCG/ACT NA SUSP: 2.0000 | Freq: Every day | NASAL | 5 refills | Status: DC

## 2018-04-18 MED ORDER — AZITHROMYCIN 250 MG PO TABS: ORAL_TABLET | ORAL | 0 refills | Status: DC

## 2018-04-18 MED ORDER — LORATADINE 10 MG PO TABS: 10.0000 mg | ORAL_TABLET | Freq: Every day | ORAL | 5 refills | Status: DC

## 2018-04-18 NOTE — Telephone Encounter (Signed)
Is she using nose spray and antihistamine we previously recommended? flonase and claritin otc If yes  Ok to call in z pack #1 as directed She will need to be seen if she feels no better after that

## 2018-04-18 NOTE — Telephone Encounter (Signed)
Patient wanted to send in rx for the flonase and claritin as well.  Rxs have been sent in along with zpak

## 2018-04-18 NOTE — Telephone Encounter (Signed)
Patient states that she feels a little better no fever now.  She started feeling bad last Wednesday.  She some green mucus coming up when she coughs and when blowing her now.  Cough does not keep her up at night.  She has not been taking anything for it.  Offered appt and seem like she did not want.  Is there anything you recommend with out seeing her/

## 2018-04-25 IMAGING — US US ABDOMEN LIMITED
1 series · 14 of 19 positions shown · non-contrast
Comparison: Abdominal and pelvic CT scan March 22, 2016

CLINICAL DATA: Left upper quadrant pain for the past 3-4 days.
History of partial gastrectomy in 3494 for malignancy.

EXAM:
LIMITED ABDOMINAL ULTRASOUND

[Series 1: us abdomen limited · 0.22mm/px · 14 of 19 slices shown]
[im 1/19]
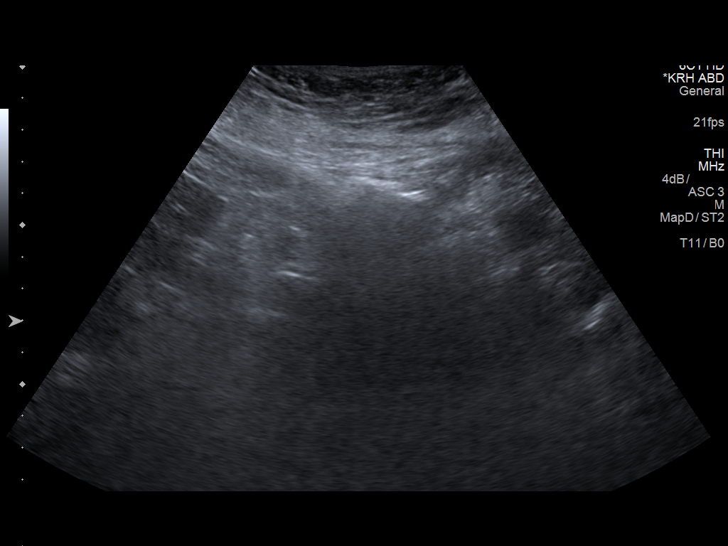
[im 3/19]
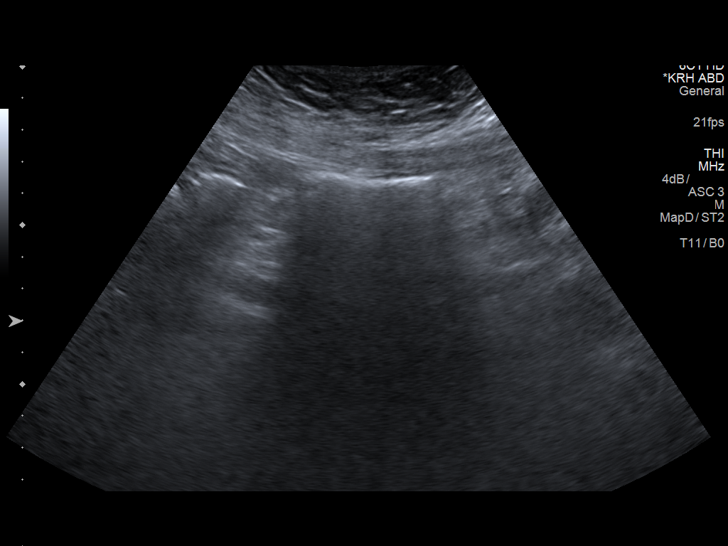
[im 4/19]
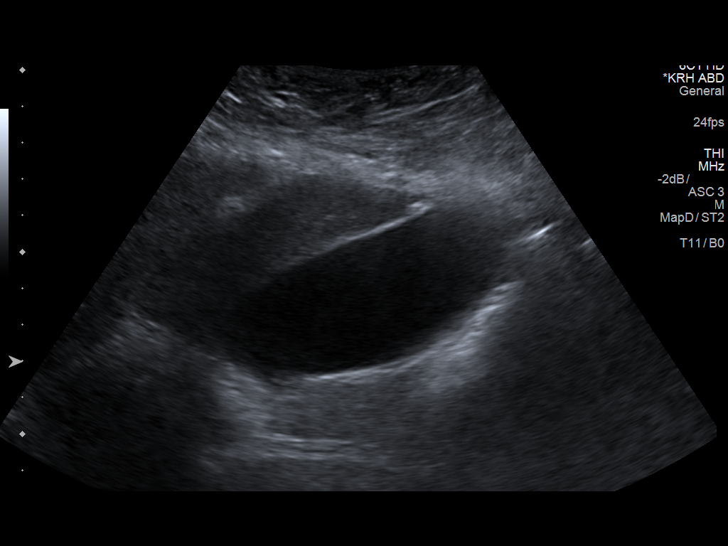
[im 5/19]
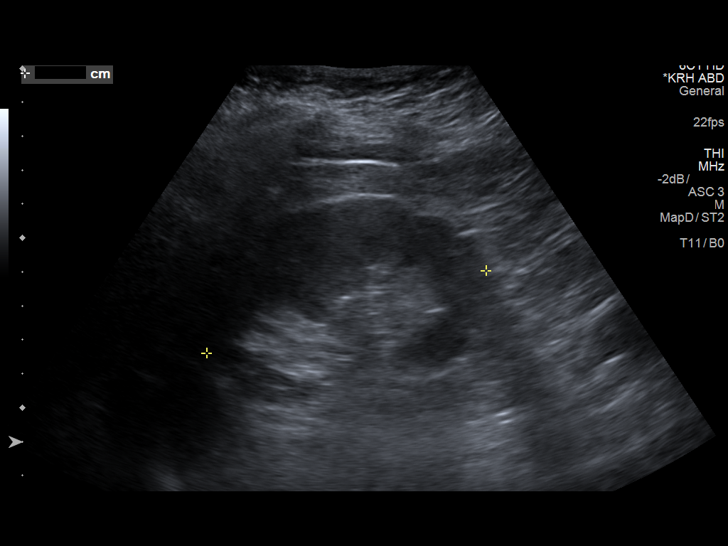
[im 7/19]
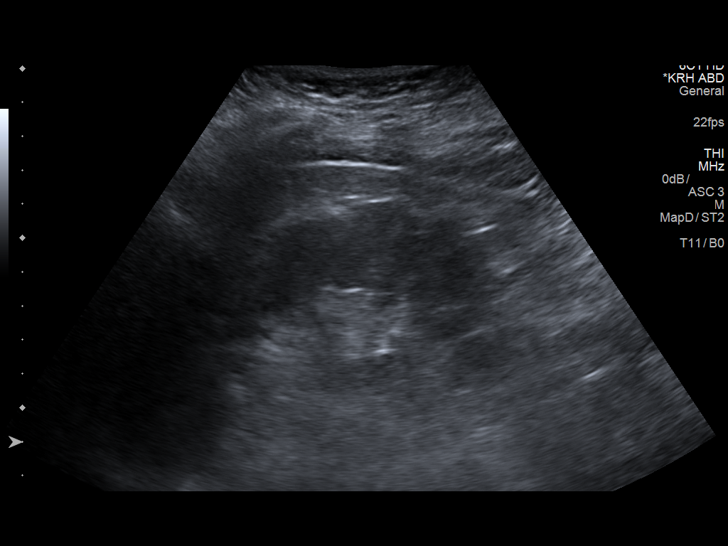
[im 8/19]
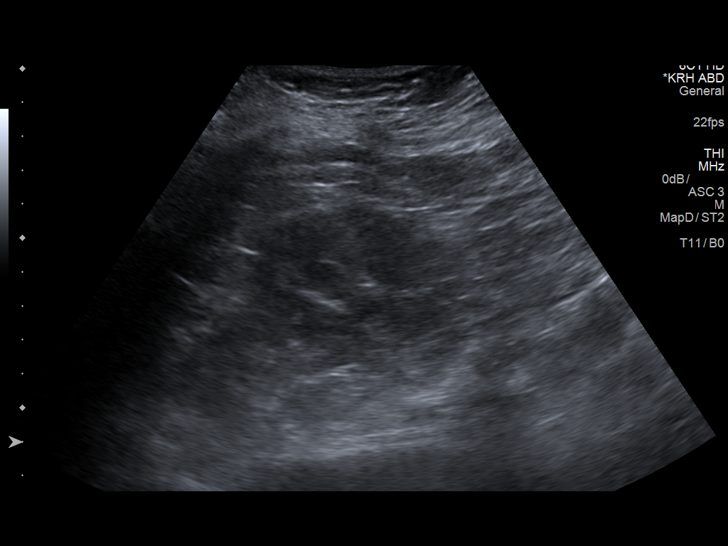
[im 9/19]
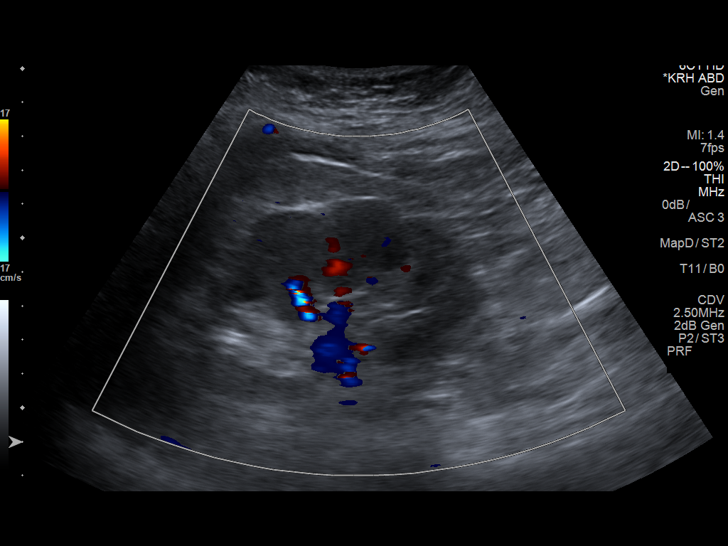
[im 11/19]
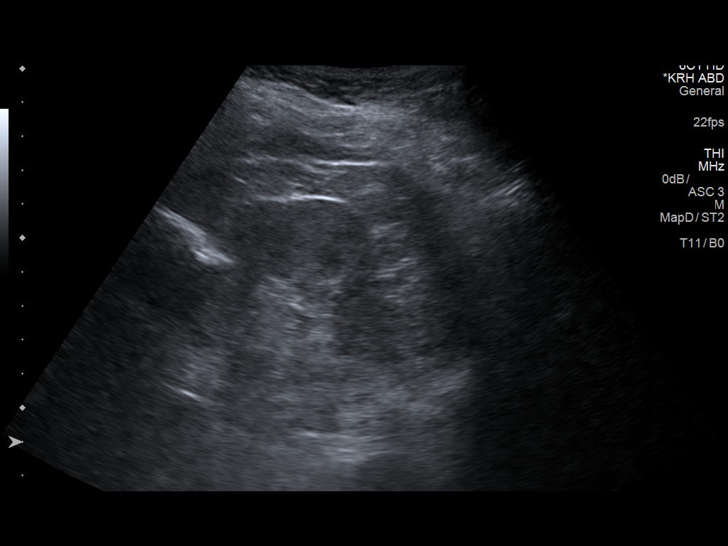
[im 12/19]
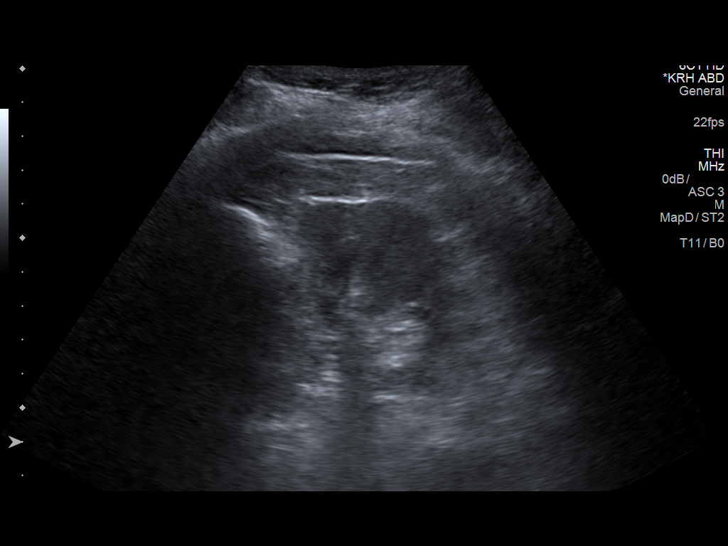
[im 13/19]
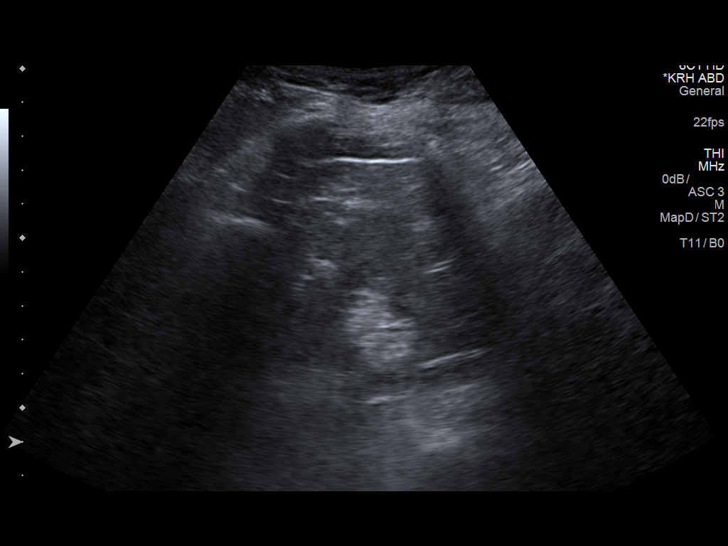
[im 15/19]
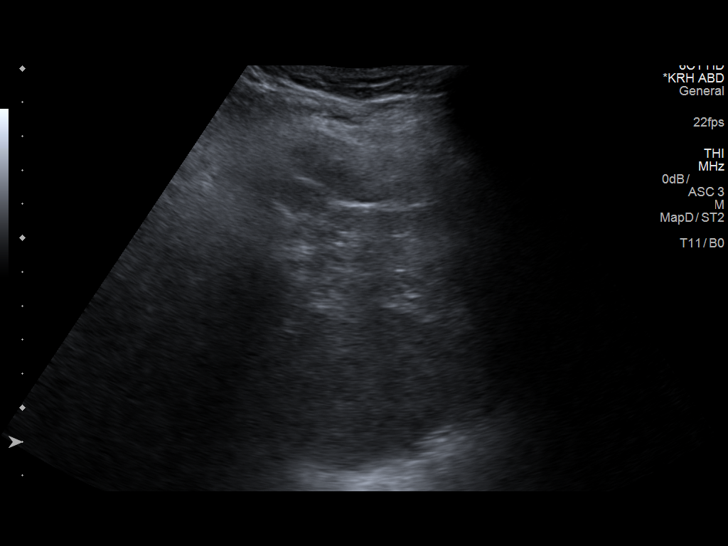
[im 16/19]
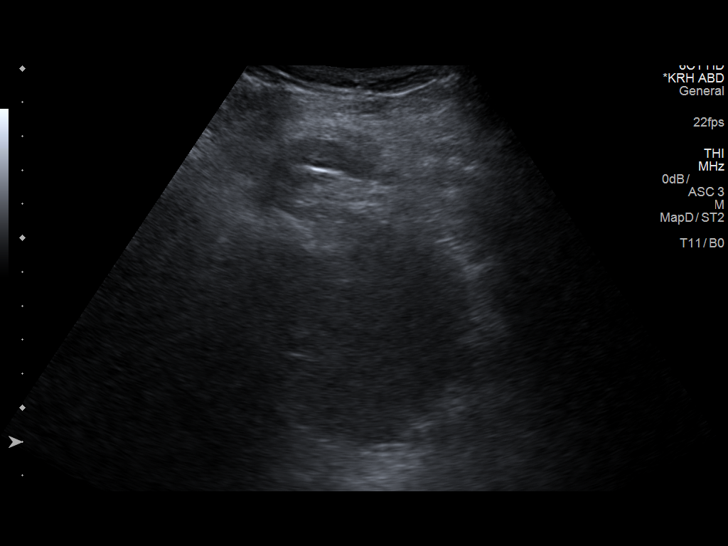
[im 17/19]
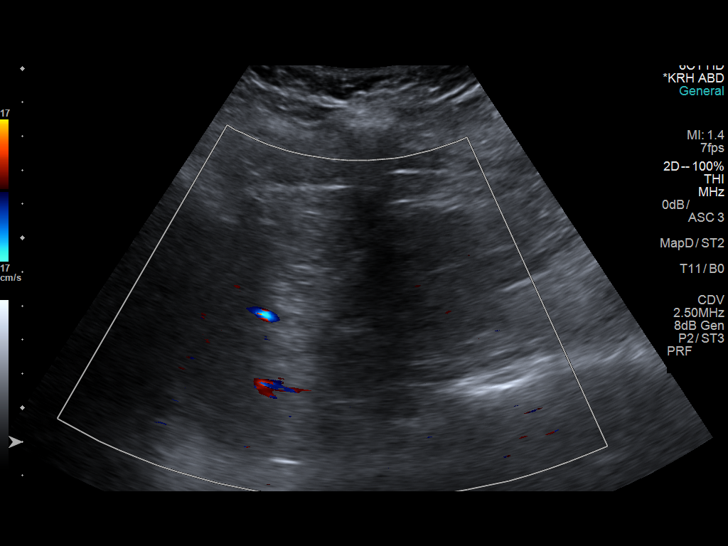
[im 19/19]
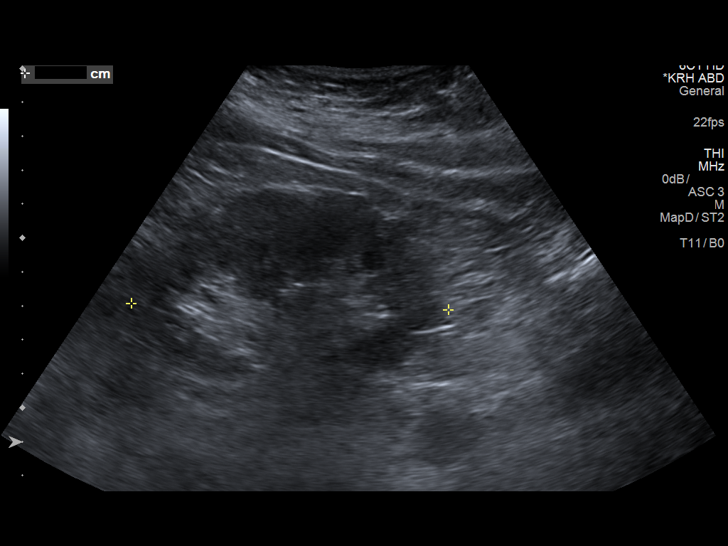

[14 of 19 positions shown; findings below may reference images not displayed]

FINDINGS: The left upper quadrant was interrogated. In the area of discomfort
at the angle of the ribs no discrete abnormality is observed. A
small spleen measuring up to 3.5 cm is observed. There is limited
visualization of the left kidney which measures 9.3 cm in length and
reveals no hydronephrosis nor discrete mass.
IMPRESSION: No ultrasonic abnormality is observed in the left upper quadrant of
the abdomen. Given the patient's history of gastric malignancy in
the current symptoms, abdominal and pelvic CT scanning is
recommended.

## 2018-04-27 ENCOUNTER — Other Ambulatory Visit: Payer: Self-pay | Admitting: Family Medicine

## 2018-04-27 DIAGNOSIS — G894 Chronic pain syndrome: Secondary | ICD-10-CM

## 2018-05-05 ENCOUNTER — Other Ambulatory Visit: Payer: Self-pay | Admitting: Family Medicine

## 2018-05-17 DIAGNOSIS — I129 Hypertensive chronic kidney disease with stage 1 through stage 4 chronic kidney disease, or unspecified chronic kidney disease: Secondary | ICD-10-CM | POA: Diagnosis not present

## 2018-05-17 DIAGNOSIS — N183 Chronic kidney disease, stage 3 (moderate): Secondary | ICD-10-CM | POA: Diagnosis not present

## 2018-05-17 DIAGNOSIS — Z6834 Body mass index (BMI) 34.0-34.9, adult: Secondary | ICD-10-CM | POA: Diagnosis not present

## 2018-05-17 DIAGNOSIS — E785 Hyperlipidemia, unspecified: Secondary | ICD-10-CM | POA: Diagnosis not present

## 2018-05-17 DIAGNOSIS — E1122 Type 2 diabetes mellitus with diabetic chronic kidney disease: Secondary | ICD-10-CM | POA: Diagnosis not present

## 2018-05-18 ENCOUNTER — Telehealth: Payer: Self-pay | Admitting: *Deleted

## 2018-05-18 NOTE — Telephone Encounter (Signed)
Copied from [place] [id number]. Topic: General - Inquiry >> May 18, 2018  3:11 PM Conception Chancy, NT wrote: Reason for CRM: patient is requesting [name] nurse to give her a call. She states she would like her to contact her insurance to see which machine she is needing for diabetic testing.

## 2018-05-21 NOTE — Telephone Encounter (Signed)
Left detailed message on phone that she will have to call insurance (takes a lot time) or we can write a generic prescription to send to pharmacy.

## 2018-05-26 ENCOUNTER — Other Ambulatory Visit: Payer: Self-pay | Admitting: Family Medicine

## 2018-06-04 ENCOUNTER — Encounter: Payer: Self-pay | Admitting: Family

## 2018-06-04 ENCOUNTER — Inpatient Hospital Stay: Payer: Medicare HMO

## 2018-06-04 ENCOUNTER — Inpatient Hospital Stay (HOSPITAL_BASED_OUTPATIENT_CLINIC_OR_DEPARTMENT_OTHER): Payer: Medicare HMO | Admitting: Family

## 2018-06-04 ENCOUNTER — Other Ambulatory Visit: Payer: Self-pay

## 2018-06-04 ENCOUNTER — Inpatient Hospital Stay: Payer: Medicare HMO | Attending: Family

## 2018-06-04 ENCOUNTER — Telehealth: Payer: Self-pay | Admitting: Hematology & Oncology

## 2018-06-04 VITALS — BP 114/74 | HR 69 | Temp 98.2°F | Resp 16 | Wt 231.8 lb

## 2018-06-04 DIAGNOSIS — Z7984 Long term (current) use of oral hypoglycemic drugs: Secondary | ICD-10-CM | POA: Insufficient documentation

## 2018-06-04 DIAGNOSIS — N183 Chronic kidney disease, stage 3 unspecified: Secondary | ICD-10-CM

## 2018-06-04 DIAGNOSIS — D508 Other iron deficiency anemias: Secondary | ICD-10-CM

## 2018-06-04 DIAGNOSIS — D509 Iron deficiency anemia, unspecified: Secondary | ICD-10-CM | POA: Insufficient documentation

## 2018-06-04 DIAGNOSIS — B373 Candidiasis of vulva and vagina: Secondary | ICD-10-CM | POA: Insufficient documentation

## 2018-06-04 DIAGNOSIS — K760 Fatty (change of) liver, not elsewhere classified: Secondary | ICD-10-CM

## 2018-06-04 DIAGNOSIS — C162 Malignant neoplasm of body of stomach: Secondary | ICD-10-CM

## 2018-06-04 DIAGNOSIS — E1169 Type 2 diabetes mellitus with other specified complication: Secondary | ICD-10-CM

## 2018-06-04 DIAGNOSIS — Z9221 Personal history of antineoplastic chemotherapy: Secondary | ICD-10-CM | POA: Insufficient documentation

## 2018-06-04 DIAGNOSIS — Z85028 Personal history of other malignant neoplasm of stomach: Secondary | ICD-10-CM

## 2018-06-04 DIAGNOSIS — C169 Malignant neoplasm of stomach, unspecified: Secondary | ICD-10-CM

## 2018-06-04 DIAGNOSIS — R634 Abnormal weight loss: Secondary | ICD-10-CM

## 2018-06-04 DIAGNOSIS — E119 Type 2 diabetes mellitus without complications: Secondary | ICD-10-CM | POA: Diagnosis not present

## 2018-06-04 DIAGNOSIS — E785 Hyperlipidemia, unspecified: Secondary | ICD-10-CM

## 2018-06-04 DIAGNOSIS — G629 Polyneuropathy, unspecified: Secondary | ICD-10-CM

## 2018-06-04 DIAGNOSIS — M545 Low back pain, unspecified: Secondary | ICD-10-CM

## 2018-06-04 DIAGNOSIS — B3731 Acute candidiasis of vulva and vagina: Secondary | ICD-10-CM

## 2018-06-04 DIAGNOSIS — Z79899 Other long term (current) drug therapy: Secondary | ICD-10-CM

## 2018-06-04 DIAGNOSIS — J180 Bronchopneumonia, unspecified organism: Secondary | ICD-10-CM

## 2018-06-04 DIAGNOSIS — Z7982 Long term (current) use of aspirin: Secondary | ICD-10-CM | POA: Diagnosis not present

## 2018-06-04 DIAGNOSIS — Z923 Personal history of irradiation: Secondary | ICD-10-CM | POA: Insufficient documentation

## 2018-06-04 DIAGNOSIS — K921 Melena: Secondary | ICD-10-CM

## 2018-06-04 DIAGNOSIS — K9289 Other specified diseases of the digestive system: Secondary | ICD-10-CM

## 2018-06-04 DIAGNOSIS — J4541 Moderate persistent asthma with (acute) exacerbation: Secondary | ICD-10-CM

## 2018-06-04 DIAGNOSIS — G894 Chronic pain syndrome: Secondary | ICD-10-CM

## 2018-06-04 DIAGNOSIS — N179 Acute kidney failure, unspecified: Secondary | ICD-10-CM

## 2018-06-04 DIAGNOSIS — I950 Idiopathic hypotension: Secondary | ICD-10-CM

## 2018-06-04 DIAGNOSIS — R531 Weakness: Secondary | ICD-10-CM

## 2018-06-04 DIAGNOSIS — E042 Nontoxic multinodular goiter: Secondary | ICD-10-CM

## 2018-06-04 DIAGNOSIS — J101 Influenza due to other identified influenza virus with other respiratory manifestations: Secondary | ICD-10-CM

## 2018-06-04 DIAGNOSIS — M25511 Pain in right shoulder: Secondary | ICD-10-CM

## 2018-06-04 LAB — CBC WITH DIFFERENTIAL (CANCER CENTER ONLY)
ABS IMMATURE GRANULOCYTES: 0.02 10*3/uL (ref 0.00–0.07)
BASOS ABS: 0.1 10*3/uL (ref 0.0–0.1)
BASOS PCT: 1 %
EOS ABS: 0.5 10*3/uL (ref 0.0–0.5)
Eosinophils Relative: 7 %
HCT: 35.1 % — ABNORMAL LOW (ref 36.0–46.0)
Hemoglobin: 11.2 g/dL — ABNORMAL LOW (ref 12.0–15.0)
IMMATURE GRANULOCYTES: 0 %
LYMPHS ABS: 2.2 10*3/uL (ref 0.7–4.0)
Lymphocytes Relative: 31 %
MCH: 28.4 pg (ref 26.0–34.0)
MCHC: 31.9 g/dL (ref 30.0–36.0)
MCV: 88.9 fL (ref 80.0–100.0)
MONOS PCT: 10 %
Monocytes Absolute: 0.7 10*3/uL (ref 0.1–1.0)
NEUTROS ABS: 3.6 10*3/uL (ref 1.7–7.7)
NEUTROS PCT: 51 %
NRBC: 0 % (ref 0.0–0.2)
PLATELETS: 271 10*3/uL (ref 150–400)
RBC: 3.95 MIL/uL (ref 3.87–5.11)
RDW: 14 % (ref 11.5–15.5)
WBC Count: 7 10*3/uL (ref 4.0–10.5)

## 2018-06-04 LAB — CMP (CANCER CENTER ONLY)
ALBUMIN: 3.3 g/dL — AB (ref 3.5–5.0)
ALK PHOS: 121 U/L (ref 38–126)
ALT: 14 U/L (ref 0–44)
ANION GAP: 10 (ref 5–15)
AST: 14 U/L — ABNORMAL LOW (ref 15–41)
BUN: 17 mg/dL (ref 8–23)
CALCIUM: 9.3 mg/dL (ref 8.9–10.3)
CO2: 26 mmol/L (ref 22–32)
Chloride: 104 mmol/L (ref 98–111)
Creatinine: 1.39 mg/dL — ABNORMAL HIGH (ref 0.44–1.00)
GFR, EST AFRICAN AMERICAN: 45 mL/min — AB (ref >60)
GFR, Estimated: 39 mL/min — ABNORMAL LOW (ref >60)
GLUCOSE: 319 mg/dL — AB (ref 70–99)
Potassium: 5.3 mmol/L — ABNORMAL HIGH (ref 3.5–5.1)
SODIUM: 140 mmol/L (ref 135–145)
Total Bilirubin: 0.3 mg/dL (ref 0.3–1.2)
Total Protein: 7 g/dL (ref 6.5–8.1)

## 2018-06-04 MED ORDER — HEPARIN SOD (PORK) LOCK FLUSH 100 UNIT/ML IV SOLN
500.0000 [IU] | Freq: Once | INTRAVENOUS | Status: AC | PRN
  Administered 2018-06-04: 500 [IU] via INTRAVENOUS
  Filled 2018-06-04: qty 5

## 2018-06-04 MED ORDER — ALTEPLASE 2 MG IJ SOLR
INTRAMUSCULAR | Status: AC
  Filled 2018-06-04: qty 2

## 2018-06-04 MED ORDER — STERILE WATER FOR INJECTION IJ SOLN
INTRAMUSCULAR | Status: AC
  Filled 2018-06-04: qty 10

## 2018-06-04 MED ORDER — ALTEPLASE 2 MG IJ SOLR
2.0000 mg | Freq: Once | INTRAMUSCULAR | Status: DC
  Filled 2018-06-04: qty 2

## 2018-06-04 MED ORDER — TRAMADOL HCL 50 MG PO TABS: 100.0000 mg | ORAL_TABLET | Freq: Three times a day (TID) | ORAL | 0 refills | Status: DC | PRN

## 2018-06-04 MED ORDER — SODIUM CHLORIDE 0.9% FLUSH
10.0000 mL | Freq: Once | INTRAVENOUS | Status: AC
  Administered 2018-06-04: 10 mL
  Filled 2018-06-04: qty 10

## 2018-06-04 MED ORDER — FLUCONAZOLE 150 MG PO TABS: 150.0000 mg | ORAL_TABLET | Freq: Once | ORAL | 0 refills | Status: AC

## 2018-06-04 NOTE — Progress Notes (Signed)
Hematology and Oncology Follow Up Visit  [name] [id number] Mar 01, 1951 67 y.o. 06/04/2018   Principle Diagnosis:  Stage IB (T1, N1, M0) adenocarcinoma of the stomach Iron deficiency anemia  Current Therapy:   Observation IV iron as indicated - last received in December 2016   Interim History: [name] is here today for follow-up. She is doing fairly well but has noticed vaginal itching and burning since recently being on an antibiotic. No discharge.  No fever, chills, cough, rash, dizziness, SOB, chest pain, palpitations, abdominal pain or changes in bowel or bladder habits.  No swelling or tenderness in [name] extremities at this time. The neuropathy in [name] hands and feet  Is unchanged, still worse in the hands. She is taking [name] Neurontin as prescribed and then Ultram 100 mg PO PRN for breakthrough pain.  She is using [name] walker at home when ambulating for support.  She still has occasional twinges on "pinching" pain in [name] left side. This comes and goes.  She is working out and riding [name] stationary bike.  She has maintained a good appetite and is staying well hydrated. [name] weight is stable.   ECOG Performance Status: 1 - Symptomatic but completely ambulatory  Medications:  Allergies as of 06/04/2018      Reactions   Adhesive [tape] Other (See Comments)   Burn Skin   Codeine Other (See Comments)   paranoid   Zocor [simvastatin - High Dose] Other (See Comments)   Muscle spasms   Penicillins Rash   Has patient had a PCN reaction causing immediate rash, facial/tongue/throat swelling, SOB or lightheadedness with hypotension: Yes Has patient had a PCN reaction causing severe rash involving mucus membranes or skin necrosis: No Has patient had a PCN reaction that required hospitalization does not remember.  Has patient had a PCN reaction occurring within the last 10 years: No If all of the above answers are "NO", then may proceed with Cephalosporin use.      Medication List          Accurate as of 06/04/18 11:38 AM. Always use your most recent med list.          albuterol (2.5 MG/3ML) 0.083% nebulizer solution Commonly known as:  PROVENTIL Take 3 mLs (2.5 mg total) by nebulization every 6 (six) hours as needed for wheezing or shortness of breath.   albuterol 108 (90 Base) MCG/ACT inhaler Commonly known as:  PROVENTIL HFA;VENTOLIN HFA Inhale 2 puffs into the lungs every 6 (six) hours as needed for wheezing or shortness of breath.   aspirin 81 MG tablet Take 81 mg by mouth at bedtime.   fluticasone 50 MCG/ACT nasal spray Commonly known as:  FLONASE Place 2 sprays into both nostrils daily.   gabapentin 400 MG capsule Commonly known as:  NEURONTIN Take 2 capsules (800 mg total) by mouth 3 (three) times daily.   glimepiride 4 MG tablet Commonly known as:  AMARYL Take 1 tablet (4 mg total) by mouth 2 (two) times daily.   ipratropium-albuterol 0.5-2.5 (3) MG/3ML Soln Commonly known as:  DUONEB Take 3 mLs by nebulization every 6 (six) hours as needed.   metFORMIN 500 MG tablet Commonly known as:  GLUCOPHAGE Take 1 tablet (500 mg total) by mouth 2 (two) times daily with a meal.   multivitamin with minerals Tabs tablet Take 1 tablet by mouth daily.   sertraline 50 MG tablet Commonly known as:  ZOLOFT TAKE ONE TABLET BY MOUTH EVERY DAY   traMADol 50 MG tablet  Commonly known as:  ULTRAM Take 1 tablet (50 mg total) by mouth 3 (three) times daily as needed for moderate pain.       Allergies:  Allergies  Allergen Reactions  . Adhesive [Tape] Other (See Comments)    Burn Skin  . Codeine Other (See Comments)    paranoid  . Zocor [Simvastatin - High Dose] Other (See Comments)    Muscle spasms  . Penicillins Rash    Has patient had a PCN reaction causing immediate rash, facial/tongue/throat swelling, SOB or lightheadedness with hypotension: Yes Has patient had a PCN reaction causing severe rash involving mucus membranes or skin necrosis:  No Has patient had a PCN reaction that required hospitalization does not remember.  Has patient had a PCN reaction occurring within the last 10 years: No If all of the above answers are "NO", then may proceed with Cephalosporin use.     Past Medical History, Surgical history, Social history, and Family History were reviewed and updated.  Review of Systems: All other 10 point review of systems is negative.   Physical Exam:  weight is 231 lb 12 oz (105.1 kg). [name] oral temperature is 98.2 F (36.8 C). [name] blood pressure is 114/74 and [name] pulse is 69. [name] respiration is 16 and oxygen saturation is 99%.   Wt Readings from Last 3 Encounters:  06/04/18 231 lb 12 oz (105.1 kg)  04/09/18 218 lb 6.4 oz (99.1 kg)  02/26/18 218 lb (98.9 kg)    Ocular: Sclerae unicteric, pupils equal, round and reactive to light Ear-nose-throat: Oropharynx clear, dentition fair Lymphatic: No cervical, supraclavicular or axillary adenopathy Lungs no rales or rhonchi, good excursion bilaterally Heart regular rate and rhythm, no murmur appreciated Abd soft, nontender, positive bowel sounds, no liver or spleen tip palpated on exam, no fluid wave  MSK no focal spinal tenderness, no joint edema Neuro: non-focal, well-oriented, appropriate affect Breasts: Deferred   Lab Results  Component Value Date   WBC 7.0 06/04/2018   HGB 11.2 (L) 06/04/2018   HCT 35.1 (L) 06/04/2018   MCV 88.9 06/04/2018   PLT 271 06/04/2018   Lab Results  Component Value Date   FERRITIN 252 02/26/2018   IRON 63 02/26/2018   TIBC 269 02/26/2018   UIBC 205 02/26/2018   IRONPCTSAT 24 02/26/2018   Lab Results  Component Value Date   RETICCTPCT 1.5 04/28/2014   RBC 3.95 06/04/2018   RETICCTABS 59.9 04/28/2014   No results found for: KPAFRELGTCHN, LAMBDASER, KAPLAMBRATIO No results found for: IGGSERUM, IGA, IGMSERUM No results found for: Odetta Pink, SPEI   Chemistry       Component Value Date/Time   NA 138 04/09/2018 1230   NA 138 07/06/2017 0745   K 5.3 (H) 04/09/2018 1230   K 4.4 07/06/2017 0745   CL 101 04/09/2018 1230   CL 101 08/02/2016 1351   CO2 33 (H) 04/09/2018 1230   CO2 22 07/06/2017 0745   BUN 27 (H) 04/09/2018 1230   BUN 19.7 07/06/2017 0745   CREATININE 1.36 (H) 04/09/2018 1230   CREATININE 1.10 02/26/2018 1120   CREATININE 1.3 (H) 07/06/2017 0745      Component Value Date/Time   CALCIUM 9.8 04/09/2018 1230   CALCIUM 9.3 07/06/2017 0745   ALKPHOS 126 (H) 04/09/2018 1230   ALKPHOS 115 07/06/2017 0745   AST 13 04/09/2018 1230   AST 20 02/26/2018 1120   AST 18 07/06/2017 0745   ALT 13 04/09/2018 1230  ALT 16 02/26/2018 1120   ALT 14 07/06/2017 0745   BILITOT 0.4 04/09/2018 1230   BILITOT 0.5 02/26/2018 1120   BILITOT 0.30 07/06/2017 0745       Impression and Plan: [name] is a very pleasant 67 yo African American female with history of stage IB stomach cancer. She had [name] tumor resected and completed adjuvant radiation and chemo in 2010.  She continues to do well but has had some vaginal itching and burning. Prescription sent for one time dose of Diflucan 150 mg PO.  Iron studies are pending.  We will continue to follow along with [name] and see [name] back in a month.  She will contact our office with any questions or concerns. We can certainly see [name] sooner if need be.   Laverna Peace, NP 11/11/201911:38 AM

## 2018-06-04 NOTE — Patient Instructions (Signed)
Implanted Port Home Guide An implanted port is a type of central line that is placed under the skin. Central lines are used to provide IV access when treatment or nutrition needs to be given through a person's veins. Implanted ports are used for long-term IV access. An implanted port may be placed because:  You need IV medicine that would be irritating to the small veins in your hands or arms.  You need long-term IV medicines, such as antibiotics.  You need IV nutrition for a long period.  You need frequent blood draws for lab tests.  You need dialysis.  Implanted ports are usually placed in the chest area, but they can also be placed in the upper arm, the abdomen, or the leg. An implanted port has two main parts:  Reservoir. The reservoir is round and will appear as a small, raised area under your skin. The reservoir is the part where a needle is inserted to give medicines or draw blood.  Catheter. The catheter is a thin, flexible tube that extends from the reservoir. The catheter is placed into a large vein. Medicine that is inserted into the reservoir goes into the catheter and then into the vein.  How will I care for my incision site? Do not get the incision site wet. Bathe or shower as directed by your health care provider. How is my port accessed? Special steps must be taken to access the port:  Before the port is accessed, a numbing cream can be placed on the skin. This helps numb the skin over the port site.  Your health care provider uses a sterile technique to access the port. ? Your health care provider must put on a mask and sterile gloves. ? The skin over your port is cleaned carefully with an antiseptic and allowed to dry. ? The port is gently pinched between sterile gloves, and a needle is inserted into the port.  Only "non-coring" port needles should be used to access the port. Once the port is accessed, a blood return should be checked. This helps ensure that the port  is in the vein and is not clogged.  If your port needs to remain accessed for a constant infusion, a clear (transparent) bandage will be placed over the needle site. The bandage and needle will need to be changed every week, or as directed by your health care provider.  Keep the bandage covering the needle clean and dry. Do not get it wet. Follow your health care provider's instructions on how to take a shower or bath while the port is accessed.  If your port does not need to stay accessed, no bandage is needed over the port.  What is flushing? Flushing helps keep the port from getting clogged. Follow your health care provider's instructions on how and when to flush the port. Ports are usually flushed with saline solution or a medicine called heparin. The need for flushing will depend on how the port is used.  If the port is used for intermittent medicines or blood draws, the port will need to be flushed: ? After medicines have been given. ? After blood has been drawn. ? As part of routine maintenance.  If a constant infusion is running, the port may not need to be flushed.  How long will my port stay implanted? The port can stay in for as long as your health care provider thinks it is needed. When it is time for the port to come out, surgery will be   done to remove it. The procedure is similar to the one performed when the port was put in. When should I seek immediate medical care? When you have an implanted port, you should seek immediate medical care if:  You notice a bad smell coming from the incision site.  You have swelling, redness, or drainage at the incision site.  You have more swelling or pain at the port site or the surrounding area.  You have a fever that is not controlled with medicine.  This information is not intended to replace advice given to you by your health care provider. Make sure you discuss any questions you have with your health care provider. Document  Released: 07/11/2005 Document Revised: 12/17/2015 Document Reviewed: 03/18/2013 Elsevier Interactive Patient Education  2017 [employer]  

## 2018-06-04 NOTE — Telephone Encounter (Signed)
Appts scheduled letter/calendar mailed per 11/11 los

## 2018-06-05 LAB — IRON AND TIBC
Iron: 71 ug/dL (ref 41–142)
Saturation Ratios: 28 % (ref 21–57)
TIBC: 258 ug/dL (ref 236–444)
UIBC: 186 ug/dL (ref 120–384)

## 2018-06-05 LAB — FERRITIN: Ferritin: 221 ng/mL (ref 11–307)

## 2018-06-08 ENCOUNTER — Other Ambulatory Visit: Payer: Self-pay | Admitting: Family Medicine

## 2018-06-08 DIAGNOSIS — G894 Chronic pain syndrome: Secondary | ICD-10-CM

## 2018-06-11 ENCOUNTER — Other Ambulatory Visit: Payer: Self-pay | Admitting: *Deleted

## 2018-06-11 DIAGNOSIS — G894 Chronic pain syndrome: Secondary | ICD-10-CM

## 2018-06-11 MED ORDER — TRAMADOL HCL 50 MG PO TABS: 100.0000 mg | ORAL_TABLET | Freq: Three times a day (TID) | ORAL | 0 refills | Status: DC | PRN

## 2018-07-14 ENCOUNTER — Other Ambulatory Visit: Payer: Self-pay | Admitting: Family Medicine

## 2018-07-14 ENCOUNTER — Ambulatory Visit (HOSPITAL_BASED_OUTPATIENT_CLINIC_OR_DEPARTMENT_OTHER): Payer: Self-pay

## 2018-07-16 ENCOUNTER — Other Ambulatory Visit: Payer: Self-pay

## 2018-07-16 ENCOUNTER — Other Ambulatory Visit: Payer: Self-pay | Admitting: *Deleted

## 2018-07-16 DIAGNOSIS — G894 Chronic pain syndrome: Secondary | ICD-10-CM

## 2018-07-16 MED ORDER — TRAMADOL HCL 50 MG PO TABS: 100.0000 mg | ORAL_TABLET | Freq: Three times a day (TID) | ORAL | 0 refills | Status: DC | PRN

## 2018-07-30 ENCOUNTER — Inpatient Hospital Stay: Payer: Medicare HMO | Attending: Family

## 2018-07-30 ENCOUNTER — Ambulatory Visit (HOSPITAL_BASED_OUTPATIENT_CLINIC_OR_DEPARTMENT_OTHER)
Admission: RE | Admit: 2018-07-30 | Discharge: 2018-07-30 | Disposition: A | Discharge status: Home / Self Care | Payer: Medicare HMO | Source: Ambulatory Visit | Attending: Family Medicine | Admitting: Family Medicine

## 2018-07-30 ENCOUNTER — Other Ambulatory Visit: Payer: Self-pay | Admitting: Family

## 2018-07-30 VITALS — BP 113/81 | HR 96 | Temp 98.1°F | Resp 20

## 2018-07-30 DIAGNOSIS — C162 Malignant neoplasm of body of stomach: Secondary | ICD-10-CM

## 2018-07-30 DIAGNOSIS — Z1231 Encounter for screening mammogram for malignant neoplasm of breast: Secondary | ICD-10-CM

## 2018-07-30 DIAGNOSIS — Z452 Encounter for adjustment and management of vascular access device: Secondary | ICD-10-CM | POA: Insufficient documentation

## 2018-07-30 DIAGNOSIS — Z85028 Personal history of other malignant neoplasm of stomach: Secondary | ICD-10-CM | POA: Diagnosis present

## 2018-07-30 DIAGNOSIS — K219 Gastro-esophageal reflux disease without esophagitis: Secondary | ICD-10-CM

## 2018-07-30 DIAGNOSIS — Z95828 Presence of other vascular implants and grafts: Secondary | ICD-10-CM

## 2018-07-30 MED ORDER — ALTEPLASE 2 MG IJ SOLR
2.0000 mg | Freq: Once | INTRAMUSCULAR | Status: AC
  Administered 2018-07-30: 2 mg
  Filled 2018-07-30: qty 2

## 2018-07-30 MED ORDER — SODIUM CHLORIDE 0.9% FLUSH
10.0000 mL | INTRAVENOUS | Status: DC | PRN
  Administered 2018-07-30: 10 mL via INTRAVENOUS
  Filled 2018-07-30: qty 10

## 2018-07-30 MED ORDER — SUCRALFATE 1 GM/10ML PO SUSP: 1.0000 g | Freq: Three times a day (TID) | ORAL | 4 refills | Status: DC

## 2018-07-30 MED ORDER — HEPARIN SOD (PORK) LOCK FLUSH 100 UNIT/ML IV SOLN
500.0000 [IU] | Freq: Once | INTRAVENOUS | Status: AC
  Administered 2018-07-30: 500 [IU] via INTRAVENOUS
  Filled 2018-07-30: qty 5

## 2018-07-30 NOTE — Progress Notes (Signed)
TPA instilled at 1210. Checked at 1245 with no blood return noted. Checked at 1315 with blood tinged fluid noted. Pt had to leave d/t transportation. Aware to allow time at next appt for TPA prn. Needle d/c. dph

## 2018-07-31 ENCOUNTER — Other Ambulatory Visit: Payer: Self-pay | Admitting: *Deleted

## 2018-07-31 DIAGNOSIS — C162 Malignant neoplasm of body of stomach: Secondary | ICD-10-CM

## 2018-07-31 DIAGNOSIS — K219 Gastro-esophageal reflux disease without esophagitis: Secondary | ICD-10-CM

## 2018-07-31 MED ORDER — SUCRALFATE 1 G PO TABS: 1.0000 g | ORAL_TABLET | Freq: Two times a day (BID) | ORAL | 3 refills | Status: DC

## 2018-08-01 ENCOUNTER — Other Ambulatory Visit: Payer: Self-pay | Admitting: Hematology & Oncology

## 2018-08-01 DIAGNOSIS — G894 Chronic pain syndrome: Secondary | ICD-10-CM

## 2018-08-02 ENCOUNTER — Other Ambulatory Visit: Payer: Self-pay | Admitting: *Deleted

## 2018-08-02 DIAGNOSIS — G894 Chronic pain syndrome: Secondary | ICD-10-CM

## 2018-08-12 ENCOUNTER — Emergency Department (HOSPITAL_COMMUNITY): Payer: Medicare HMO

## 2018-08-12 ENCOUNTER — Other Ambulatory Visit: Payer: Self-pay

## 2018-08-12 ENCOUNTER — Encounter (HOSPITAL_COMMUNITY): Payer: Self-pay | Admitting: Oncology

## 2018-08-12 ENCOUNTER — Emergency Department (HOSPITAL_COMMUNITY)
Admission: EM | Admit: 2018-08-12 | Discharge: 2018-08-13 | Disposition: A | Discharge status: Home / Self Care | Payer: Medicare HMO | Attending: Emergency Medicine | Admitting: Emergency Medicine

## 2018-08-12 DIAGNOSIS — Z7984 Long term (current) use of oral hypoglycemic drugs: Secondary | ICD-10-CM | POA: Insufficient documentation

## 2018-08-12 DIAGNOSIS — Z452 Encounter for adjustment and management of vascular access device: Secondary | ICD-10-CM | POA: Diagnosis not present

## 2018-08-12 DIAGNOSIS — I10 Essential (primary) hypertension: Secondary | ICD-10-CM | POA: Diagnosis not present

## 2018-08-12 DIAGNOSIS — J45909 Unspecified asthma, uncomplicated: Secondary | ICD-10-CM | POA: Diagnosis not present

## 2018-08-12 DIAGNOSIS — I129 Hypertensive chronic kidney disease with stage 1 through stage 4 chronic kidney disease, or unspecified chronic kidney disease: Secondary | ICD-10-CM | POA: Diagnosis not present

## 2018-08-12 DIAGNOSIS — R0602 Shortness of breath: Secondary | ICD-10-CM | POA: Insufficient documentation

## 2018-08-12 DIAGNOSIS — R079 Chest pain, unspecified: Secondary | ICD-10-CM | POA: Diagnosis not present

## 2018-08-12 DIAGNOSIS — Z87891 Personal history of nicotine dependence: Secondary | ICD-10-CM | POA: Diagnosis not present

## 2018-08-12 DIAGNOSIS — N183 Chronic kidney disease, stage 3 (moderate): Secondary | ICD-10-CM | POA: Diagnosis not present

## 2018-08-12 DIAGNOSIS — R51 Headache: Secondary | ICD-10-CM | POA: Diagnosis not present

## 2018-08-12 DIAGNOSIS — E1122 Type 2 diabetes mellitus with diabetic chronic kidney disease: Secondary | ICD-10-CM | POA: Diagnosis not present

## 2018-08-12 DIAGNOSIS — R110 Nausea: Secondary | ICD-10-CM | POA: Diagnosis not present

## 2018-08-12 DIAGNOSIS — E1165 Type 2 diabetes mellitus with hyperglycemia: Secondary | ICD-10-CM | POA: Diagnosis not present

## 2018-08-12 DIAGNOSIS — Z7982 Long term (current) use of aspirin: Secondary | ICD-10-CM | POA: Diagnosis not present

## 2018-08-12 DIAGNOSIS — R739 Hyperglycemia, unspecified: Secondary | ICD-10-CM

## 2018-08-12 DIAGNOSIS — Z79899 Other long term (current) drug therapy: Secondary | ICD-10-CM | POA: Diagnosis not present

## 2018-08-12 DIAGNOSIS — R0789 Other chest pain: Secondary | ICD-10-CM | POA: Diagnosis not present

## 2018-08-12 LAB — BASIC METABOLIC PANEL
Anion gap: 10 (ref 5–15)
BUN: 17 mg/dL (ref 8–23)
CALCIUM: 8.5 mg/dL — AB (ref 8.9–10.3)
CO2: 20 mmol/L — ABNORMAL LOW (ref 22–32)
Chloride: 101 mmol/L (ref 98–111)
Creatinine, Ser: 1.34 mg/dL — ABNORMAL HIGH (ref 0.44–1.00)
GFR calc Af Amer: 48 mL/min — ABNORMAL LOW (ref >60)
GFR, EST NON AFRICAN AMERICAN: 41 mL/min — AB (ref >60)
Glucose, Bld: 527 mg/dL (ref 70–99)
Potassium: 4.7 mmol/L (ref 3.5–5.1)
SODIUM: 131 mmol/L — AB (ref 135–145)

## 2018-08-12 LAB — CBC
HCT: 36.4 % (ref 36.0–46.0)
Hemoglobin: 11.3 g/dL — ABNORMAL LOW (ref 12.0–15.0)
MCH: 27.7 pg (ref 26.0–34.0)
MCHC: 31 g/dL (ref 30.0–36.0)
MCV: 89.2 fL (ref 80.0–100.0)
Platelets: 269 10*3/uL (ref 150–400)
RBC: 4.08 MIL/uL (ref 3.87–5.11)
RDW: 13.8 % (ref 11.5–15.5)
WBC: 9.5 10*3/uL (ref 4.0–10.5)
nRBC: 0 % (ref 0.0–0.2)

## 2018-08-12 LAB — I-STAT TROPONIN, ED: TROPONIN I, POC: 0 ng/mL (ref 0.00–0.08)

## 2018-08-12 MED ORDER — SODIUM CHLORIDE 0.9% FLUSH: 3.0000 mL | Freq: Once | INTRAVENOUS | Status: DC

## 2018-08-12 MED ORDER — FLUCONAZOLE 150 MG PO TABS
150.0000 mg | ORAL_TABLET | Freq: Once | ORAL | Status: AC
  Administered 2018-08-13: 150 mg via ORAL
  Filled 2018-08-12: qty 1

## 2018-08-12 MED ORDER — SODIUM CHLORIDE 0.9 % IV BOLUS
1000.0000 mL | Freq: Once | INTRAVENOUS | Status: AC
  Administered 2018-08-12: 1000 mL via INTRAVENOUS

## 2018-08-12 NOTE — ED Provider Notes (Signed)
[facility] EMERGENCY DEPARTMENT Provider Note   CSN: [id number] Arrival date & time: 08/12/18  2213    History   Chief Complaint Chief Complaint  Patient presents with  . Chest Pain    HPI [name] is a 68 y.o. female.  68 y/o female with hx of DM, HLD, HTN, TIA, adenocarcinoma of the stomach s/p resection in 2010, OSA presents to the ED for evaluation of CP. Patient states that she was watching "a 67 year old acting more like a 67 year old", "doing things like twerking and stuff." She states that she wanted to reprimand the child, "but restrained myself because you can't put your hands on a child that's not yours". The patient feels that "holding in" all this stress may have contributed to onset of her pain. She describes a central pressure-like chest pain which began this morning upon waking.  It spontaneously resolved during the afternoon and patient was able to shop at multiple stores.  She denies any worsening chest discomfort when walking from her car to the store or injury.  She also went for an afternoon walk with her daughter with no complaints of chest pain.  She was laying down tonight when she noticed the pain started again.  Her pressure-like chest pain is associated with some shortness of breath.  She has not had any associated nausea, vomiting, syncope, leg swelling, recent fevers.  The patient was given 324 mg aspirin, 8 mg morphine, 4 mg Zofran by EMS.  Patient declined use of nitroglycerin.   Last echocardiogram was in 2017 with EF of 50-55%  The history is provided by the patient. No language interpreter was used.  Chest Pain    Past Medical History:  Diagnosis Date  . Asthma   . Degenerative joint disease   . Diabetes mellitus   . Dyspnea   . GERD (gastroesophageal reflux disease)   . Hyperlipidemia    no longer on medication for this  . Hypertension    patient denies  . Neuropathy    bilateral hands and feet  . Sickle cell trait ([place])    . Sleep apnea   . stomach ca dx'd 2010   chemo/xrt comp 05/2009  . TIA (transient ischemic attack) 07/2015    Patient Active Problem List   Diagnosis Date Noted  . Hyperlipidemia associated with type 2 diabetes mellitus ([place]) 04/09/2018  . Bronchopneumonia 11/02/2017  . Sepsis ([place]) 11/02/2017  . Leucocytosis 11/02/2017  . Moderate persistent asthma with acute exacerbation 05/26/2017  . Influenza A 05/26/2017  . Acute pain of right shoulder 03/07/2017  . Acute bilateral low back pain without sciatica 03/07/2017  . Chronic pain 03/07/2017  . Fatty liver 03/24/2016  . Weakness 03/23/2016  . Unexplained weight loss 03/23/2016  . Melena 03/23/2016  . Hypotension 02/03/2016  . Syncope 02/03/2016  . Hypomagnesemia 02/03/2016  . AKI (acute kidney injury) ([place]) 02/03/2016  . Gastrointestinal dysmotility 01/20/2016  . CKD (chronic kidney disease) stage 3, GFR 30-59 ml/min (HCC) 01/20/2016  . Abscess of right thigh 01/20/2016  . Chronic pain syndrome 01/15/2016  . Diarrhea 01/15/2016  . Generalized abdominal pain 01/14/2016  . Type 2 diabetes with nephropathy ([place]) 12/30/2015  . Morbid obesity due to excess calories ([place]) 05/05/2015  . Diabetic polyneuropathy associated with type 2 diabetes mellitus ([place]) 01/02/2015  . Acute bacterial sinusitis 01/02/2015  . Onychomycosis 11/04/2014  . Pain in lower limb 11/04/2014  . Multinodular goiter (nontoxic) 10/16/2014  . CVA (cerebral infarction)  08/11/2014  . Iron deficiency anemia 05/20/2014  . Major depressive disorder, single episode, moderate ([place]) 05/20/2014  . Dizziness of unknown cause 04/28/2014  . MDD (major depressive disorder), recurrent episode, moderate ([place]) 01/28/2014  . GERD (gastroesophageal reflux disease) 01/02/2014  . Nausea 10/15/2012  . Stomach cancer ([place]) 12/10/2008  . CHRONIC RESPIRATORY FAILURE 01/03/2008  . Hyperlipidemia 06/27/2007  . OBESITY, MORBID 06/27/2007  . SLEEP APNEA, OBSTRUCTIVE 06/27/2007  .  Essential hypertension 06/27/2007  . Asthma 06/27/2007  . OSTEOARTHRITIS  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 06/27/2007    Past Surgical History:  Procedure Laterality Date  . APPENDECTOMY    . CESAREAN SECTION    . COLON SURGERY     colonscopy  . COLONOSCOPY WITH PROPOFOL N/A 06/01/2016   Procedure: COLONOSCOPY WITH PROPOFOL;  Surgeon: Wilford Corner, MD;  Location: [facility] ENDOSCOPY;  Service: Endoscopy;  Laterality: N/A;  . ESOPHAGOGASTRODUODENOSCOPY N/A 10/15/2012   Procedure: ESOPHAGOGASTRODUODENOSCOPY (EGD);  Surgeon: Lear Ng, MD;  Location: [facility] ENDOSCOPY;  Service: Endoscopy;  Laterality: N/A;  . ESOPHAGOGASTRODUODENOSCOPY N/A 01/15/2016   Procedure: ESOPHAGOGASTRODUODENOSCOPY (EGD);  Surgeon: Ronald Lobo, MD;  Location: [facility] ENDOSCOPY;  Service: Endoscopy;  Laterality: N/A;  . HERNIA REPAIR    . SHOULDER SURGERY     Left  . TOTAL HIP ARTHROPLASTY     Right  . TOTAL KNEE ARTHROPLASTY     Left     OB History    Gravida  4   Para  4   Term      Preterm      AB      Living  4     SAB      TAB      Ectopic      Multiple      Live Births               Home Medications    Prior to Admission medications   Medication Sig Start Date End Date Taking? Authorizing Provider  albuterol (PROVENTIL HFA;VENTOLIN HFA) 108 (90 Base) MCG/ACT inhaler Inhale 2 puffs into the lungs every 6 (six) hours as needed for wheezing or shortness of breath. 11/05/17   Roxan Hockey, MD  albuterol (PROVENTIL) (2.5 MG/3ML) 0.083% nebulizer solution Take 3 mLs (2.5 mg total) by nebulization every 6 (six) hours as needed for wheezing or shortness of breath. 11/05/17   Roxan Hockey, MD  aspirin 81 MG tablet Take 81 mg by mouth at bedtime.    [provider]  fluticasone (FLONASE) 50 MCG/ACT nasal spray Place 2 sprays into both nostrils daily. 04/18/18   Ann Held, DO  gabapentin (NEURONTIN) 400 MG capsule Take 2 capsules (800 mg total) by mouth 3 (three)  times daily. 08/16/17   Ann Held, DO  glimepiride (AMARYL) 4 MG tablet Take 1 tablet (4 mg total) by mouth 2 (two) times daily. 04/09/18   Roma Schanz R, DO  ipratropium-albuterol (DUONEB) 0.5-2.5 (3) MG/3ML SOLN Take 3 mLs by nebulization every 6 (six) hours as needed. Patient taking differently: Take 3 mLs by nebulization every 6 (six) hours as needed (sob and wheezing).  05/26/17   Ann Held, DO  metFORMIN (GLUCOPHAGE) 500 MG tablet Take 1 tablet (500 mg total) by mouth 2 (two) times daily with a meal. 12/05/17   Carollee Herter, Alferd Apa, DO  Multiple Vitamin (MULTIVITAMIN WITH MINERALS) TABS tablet Take 1 tablet by mouth daily.    [provider]  sertraline (  ZOLOFT) 50 MG tablet TAKE 1 TABLET BY MOUTH EVERY DAY 07/16/18   Carollee Herter, Alferd Apa, DO  sucralfate (CARAFATE) 1 g tablet Take 1 tablet (1 g total) by mouth 2 (two) times daily. 07/31/18   [place], Holli Humbles, NP  traMADol (ULTRAM) 50 MG tablet TAKE 2 TABLETS BY MOUTH 3 TIMES DAILY AS NEEDED FOR MODERATE PAIN 08/02/18   Volanda Napoleon, MD    Family History Family History  Problem Relation Age of Onset  . Diabetes Brother   . Alzheimer's disease Father 13  . Cancer Mother 4  . Diabetes Maternal Grandmother   . Cancer Maternal Grandfather        lung  . Suicidality Neg Hx   . Depression Neg Hx   . Dementia Neg Hx   . Anxiety disorder Neg Hx     Social History Social History   Tobacco Use  . Smoking status: Former Smoker    Packs/day: 1.00    Years: 15.00    Pack years: 15.00    Types: Cigarettes    Start date: 09/27/1973    Last attempt to quit: 10/15/1988    Years since quitting: 29.8  . Smokeless tobacco: Never Used  . Tobacco comment: quit smoking 14 years ago  Substance Use Topics  . Alcohol use: No    Alcohol/week: 0.0 standard drinks  . Drug use: No     Allergies   Adhesive [tape]; Codeine; Zocor [simvastatin - high dose]; and Penicillins   Review of Systems Review of  Systems  Cardiovascular: Positive for chest pain.  Ten systems reviewed and are negative for acute change, except as noted in the HPI.    Physical Exam Updated Vital Signs BP 122/70   Pulse 64   Temp 97.8 F (36.6 C) (Oral)   Resp 18   Ht 5' 6"  (1.676 m)   Wt 99.8 kg   SpO2 98%   BMI 35.51 kg/m   Physical Exam Vitals signs and nursing note reviewed.  Constitutional:      General: She is not in acute distress.    Appearance: She is well-developed. She is not diaphoretic.     Comments: Nontoxic appearing and in NAD  HENT:     Head: Normocephalic and atraumatic.  Eyes:     General: No scleral icterus.    Conjunctiva/sclera: Conjunctivae normal.  Neck:     Musculoskeletal: Normal range of motion.  Cardiovascular:     Rate and Rhythm: Normal rate and regular rhythm.     Pulses: Normal pulses.  Pulmonary:     Effort: Pulmonary effort is normal. No respiratory distress.     Breath sounds: No stridor. No wheezing.     Comments: Respirations even and unlabored Musculoskeletal: Normal range of motion.     Right lower leg: No edema.     Left lower leg: No edema.  Skin:    General: Skin is warm and dry.     Coloration: Skin is not pale.     Findings: No erythema or rash.  Neurological:     Mental Status: She is alert and oriented to person, place, and time.     Coordination: Coordination normal.     Comments: Patient moving all extremities. Ambulatory with steady gait.  Psychiatric:        Behavior: Behavior normal.      ED Treatments / Results  Labs (all labs ordered are listed, but only abnormal results are displayed) Labs Reviewed  BASIC METABOLIC PANEL -  Abnormal; Notable for the following components:      Result Value   Sodium 131 (*)    CO2 20 (*)    Glucose, Bld 527 (*)    Creatinine, Ser 1.34 (*)    Calcium 8.5 (*)    GFR calc non Af Amer 41 (*)    GFR calc Af Amer 48 (*)    All other components within normal limits  CBC - Abnormal; Notable for the  following components:   Hemoglobin 11.3 (*)    All other components within normal limits  CBG MONITORING, ED - Abnormal; Notable for the following components:   Glucose-Capillary 374 (*)    All other components within normal limits  CBG MONITORING, ED - Abnormal; Notable for the following components:   Glucose-Capillary 328 (*)    All other components within normal limits  I-STAT TROPONIN, ED  I-STAT TROPONIN, ED    EKG ED ECG REPORT   Date: 08/13/2018  Rate: 73  Rhythm: normal sinus rhythm  QRS Axis: right  Intervals: normal  ST/T Wave abnormalities: normal  Conduction Disutrbances:none  Narrative Interpretation: Sinus rhythm with RAD  Old EKG Reviewed: none available  I have personally reviewed the EKG tracing and agree with the computerized printout as noted.       Radiology Dg Chest 2 View  Result Date: 08/12/2018 CLINICAL DATA:  Pt. Complains of "heaviness" in chest since this AM with SOB. Pain in bilateral shoulders with numbness in left hand/fingers. Hx of Diabetes Mellitus.CP EXAM: CHEST - 2 VIEW COMPARISON:  12/05/2017 FINDINGS: Port in the anterior chest wall with tip in distal SVC. Normal cardiac silhouette ectatic aorta. Low lung volumes. No effusion, infiltrate or pneumothorax. Degenerate changes shoulders. IMPRESSION: No acute cardiopulmonary process. Electronically Signed   By: Suzy Bouchard M.D.   On: 08/12/2018 23:17    Procedures Procedures (including critical care time)  Medications Ordered in ED Medications  sodium chloride flush (NS) 0.9 % injection 3 mL (has no administration in time range)  sodium chloride 0.9 % bolus 1,000 mL (0 mLs Intravenous Stopped 08/13/18 0022)  fluconazole (DIFLUCAN) tablet 150 mg (150 mg Oral Given 08/13/18 0038)  insulin aspart (novoLOG) injection 5 Units (5 Units Subcutaneous Given 08/13/18 0045)    11:38 PM Patient requesting diflucan for presumed yeast infection. Reports vaginal itching with some discharge. This  may be due to uncontrolled hyperglycemia. Will order 167m PO x 1.  1:37 AM Patient with reassuring evaluation thus far.  Will obtain delta troponin.  If negative, I believe the patient can follow-up with her primary care doctor as an outpatient.  Patient would benefit from future stress test which can be completed by her PCP or a cardiologist.   Initial Impression / Assessment and Plan / ED Course  I have reviewed the triage vital signs and the nursing notes.  Pertinent labs & imaging results that were available during my care of the patient were reviewed by me and considered in my medical decision making (see chart for details).     Patient presents to the emergency department for evaluation of chest pain.  Chest pain is not exertional.  Notes mild SOB without diaphoresis, N/V.  Low suspicion for emergent cardiac etiology given reassuring workup today.  EKG is nonischemic and troponin negative x 2.  Patient has hx of reassuring echocardiogram in 2017.  Chest x-ray without evidence of mediastinal widening to suggest dissection.  No pneumothorax, pneumonia, pleural effusion.  Pulmonary embolus further considered; however, patient without  tachycardia, tachypnea, dyspnea, hypoxia.  Patient believes that her chest pain may be in part due to increased stress this weekend.  She was noted to have incidental hyperglycemia which improved following IV fluids and subcutaneous insulin.  She has not presently on insulin for blood sugar control.  I have recommended that she follow-up with her primary care doctor within the week for recheck of her blood sugars.  Counseled on healthy diet.  Return precautions discussed and provided. Patient discharged in stable condition with no unaddressed concerns.   Final Clinical Impressions(s) / ED Diagnoses   Final diagnoses:  Nonspecific chest pain  Hyperglycemia    ED Discharge Orders    None       Antonietta Breach, PA-C 08/13/18 0603    Mesner, Corene Cornea,  MD 08/13/18 [phone]

## 2018-08-12 NOTE — ED Triage Notes (Signed)
Pt bib [facility] from home d/t central CP that started this morning.  Denies any other associated sx.  Pt given 324 asa, 8 mg morphine and 4 mg zofran en route.  Pt declined nitro.  Per EMS pt's EKG was unremarkable.  Pt had mild shob s/p morphine 4L O2 applied by EMS via [place].

## 2018-08-13 DIAGNOSIS — J45909 Unspecified asthma, uncomplicated: Secondary | ICD-10-CM | POA: Diagnosis not present

## 2018-08-13 DIAGNOSIS — N183 Chronic kidney disease, stage 3 (moderate): Secondary | ICD-10-CM | POA: Diagnosis not present

## 2018-08-13 DIAGNOSIS — E1165 Type 2 diabetes mellitus with hyperglycemia: Secondary | ICD-10-CM | POA: Diagnosis not present

## 2018-08-13 DIAGNOSIS — Z7982 Long term (current) use of aspirin: Secondary | ICD-10-CM | POA: Diagnosis not present

## 2018-08-13 DIAGNOSIS — E1122 Type 2 diabetes mellitus with diabetic chronic kidney disease: Secondary | ICD-10-CM | POA: Diagnosis not present

## 2018-08-13 DIAGNOSIS — R079 Chest pain, unspecified: Secondary | ICD-10-CM | POA: Diagnosis not present

## 2018-08-13 DIAGNOSIS — I129 Hypertensive chronic kidney disease with stage 1 through stage 4 chronic kidney disease, or unspecified chronic kidney disease: Secondary | ICD-10-CM | POA: Diagnosis not present

## 2018-08-13 DIAGNOSIS — Z87891 Personal history of nicotine dependence: Secondary | ICD-10-CM | POA: Diagnosis not present

## 2018-08-13 DIAGNOSIS — Z7984 Long term (current) use of oral hypoglycemic drugs: Secondary | ICD-10-CM | POA: Diagnosis not present

## 2018-08-13 DIAGNOSIS — R0602 Shortness of breath: Secondary | ICD-10-CM | POA: Diagnosis not present

## 2018-08-13 LAB — I-STAT TROPONIN, ED: TROPONIN I, POC: 0 ng/mL (ref 0.00–0.08)

## 2018-08-13 LAB — CBG MONITORING, ED
Glucose-Capillary: 328 mg/dL — ABNORMAL HIGH (ref 70–99)
Glucose-Capillary: 374 mg/dL — ABNORMAL HIGH (ref 70–99)

## 2018-08-13 MED ORDER — INSULIN ASPART 100 UNIT/ML ~~LOC~~ SOLN
5.0000 [IU] | Freq: Once | SUBCUTANEOUS | Status: AC
  Administered 2018-08-13: 5 [IU] via SUBCUTANEOUS

## 2018-08-13 NOTE — Discharge Instructions (Addendum)
Your work-up in the emergency department was reassuring and did not reveal a concerning cause of your chest pain.  You may benefit from a future stress test which can be coordinated through your primary doctor.  We recommend that you follow-up with your primary care doctor within the week for further evaluation of your high blood sugar as well as recheck of your chest pain symptoms.  Try to limit the intake of processed foods and fast foods that you consume as this could lead to high blood sugars.  Drink plenty of water to prevent dehydration.  Continue your daily medicines.

## 2018-08-20 ENCOUNTER — Other Ambulatory Visit: Payer: Medicare HMO

## 2018-08-20 ENCOUNTER — Ambulatory Visit (INDEPENDENT_AMBULATORY_CARE_PROVIDER_SITE_OTHER): Payer: Medicare HMO | Admitting: Family Medicine

## 2018-08-20 ENCOUNTER — Encounter: Payer: Self-pay | Admitting: Family Medicine

## 2018-08-20 ENCOUNTER — Other Ambulatory Visit (HOSPITAL_COMMUNITY)
Admission: RE | Admit: 2018-08-20 | Discharge: 2018-08-20 | Disposition: A | Discharge status: Home / Self Care | Payer: Medicare HMO | Source: Ambulatory Visit | Attending: Family Medicine | Admitting: Family Medicine

## 2018-08-20 VITALS — BP 116/80 | HR 97 | Ht 66.0 in | Wt 231.0 lb

## 2018-08-20 DIAGNOSIS — N760 Acute vaginitis: Secondary | ICD-10-CM | POA: Insufficient documentation

## 2018-08-20 DIAGNOSIS — E785 Hyperlipidemia, unspecified: Secondary | ICD-10-CM

## 2018-08-20 DIAGNOSIS — I10 Essential (primary) hypertension: Secondary | ICD-10-CM

## 2018-08-20 DIAGNOSIS — E1165 Type 2 diabetes mellitus with hyperglycemia: Secondary | ICD-10-CM

## 2018-08-20 DIAGNOSIS — R110 Nausea: Secondary | ICD-10-CM | POA: Diagnosis not present

## 2018-08-20 DIAGNOSIS — E1169 Type 2 diabetes mellitus with other specified complication: Secondary | ICD-10-CM

## 2018-08-20 LAB — POC URINALSYSI DIPSTICK (AUTOMATED)
Bilirubin, UA: NEGATIVE
Blood, UA: NEGATIVE
Glucose, UA: NEGATIVE
Ketones, UA: NEGATIVE
Leukocytes, UA: NEGATIVE
NITRITE UA: NEGATIVE
Protein, UA: NEGATIVE
Spec Grav, UA: 1.02 (ref 1.010–1.025)
Urobilinogen, UA: 0.2 E.U./dL
pH, UA: 6 (ref 5.0–8.0)

## 2018-08-20 LAB — H. PYLORI ANTIBODY, IGG: H Pylori IgG: NEGATIVE

## 2018-08-20 LAB — CBC WITH DIFFERENTIAL/PLATELET
Basophils Absolute: 0 10*3/uL (ref 0.0–0.1)
Basophils Relative: 0.5 % (ref 0.0–3.0)
Eosinophils Absolute: 0.3 10*3/uL (ref 0.0–0.7)
Eosinophils Relative: 4 % (ref 0.0–5.0)
HCT: 41.9 % (ref 36.0–46.0)
Hemoglobin: 13.5 g/dL (ref 12.0–15.0)
Lymphocytes Relative: 31.3 % (ref 12.0–46.0)
Lymphs Abs: 2.4 10*3/uL (ref 0.7–4.0)
MCHC: 32.2 g/dL (ref 30.0–36.0)
MCV: 89.8 fl (ref 78.0–100.0)
MONO ABS: 0.7 10*3/uL (ref 0.1–1.0)
Monocytes Relative: 8.7 % (ref 3.0–12.0)
Neutro Abs: 4.2 10*3/uL (ref 1.4–7.7)
Neutrophils Relative %: 55.5 % (ref 43.0–77.0)
Platelets: 323 10*3/uL (ref 150.0–400.0)
RBC: 4.67 Mil/uL (ref 3.87–5.11)
RDW: 14.8 % (ref 11.5–15.5)
WBC: 7.6 10*3/uL (ref 4.0–10.5)

## 2018-08-20 LAB — COMPREHENSIVE METABOLIC PANEL
ALT: 12 U/L (ref 0–35)
AST: 12 U/L (ref 0–37)
Albumin: 3.9 g/dL (ref 3.5–5.2)
Alkaline Phosphatase: 108 U/L (ref 39–117)
BILIRUBIN TOTAL: 0.4 mg/dL (ref 0.2–1.2)
BUN: 16 mg/dL (ref 6–23)
CO2: 28 meq/L (ref 19–32)
Calcium: 10 mg/dL (ref 8.4–10.5)
Chloride: 102 mEq/L (ref 96–112)
Creatinine, Ser: 1.19 mg/dL (ref 0.40–1.20)
GFR: 54.84 mL/min — ABNORMAL LOW (ref >60.00)
Glucose, Bld: 270 mg/dL — ABNORMAL HIGH (ref 70–99)
Potassium: 5.5 mEq/L — ABNORMAL HIGH (ref 3.5–5.1)
Sodium: 139 mEq/L (ref 135–145)
Total Protein: 7.1 g/dL (ref 6.0–8.3)

## 2018-08-20 LAB — MICROALBUMIN / CREATININE URINE RATIO
Creatinine,U: 101 mg/dL
Microalb Creat Ratio: 0.7 mg/g (ref 0.0–30.0)
Microalb, Ur: <0.7 mg/dL (ref 0.0–1.9)

## 2018-08-20 LAB — LIPID PANEL
Cholesterol: 222 mg/dL — ABNORMAL HIGH (ref 0–200)
HDL: 51 mg/dL (ref >39.00)
NonHDL: 171.01
Total CHOL/HDL Ratio: 4
Triglycerides: 213 mg/dL — ABNORMAL HIGH (ref 0.0–149.0)
VLDL: 42.6 mg/dL — AB (ref 0.0–40.0)

## 2018-08-20 LAB — LDL CHOLESTEROL, DIRECT: Direct LDL: 136 mg/dL

## 2018-08-20 MED ORDER — PANTOPRAZOLE SODIUM 40 MG PO TBEC: 40.0000 mg | DELAYED_RELEASE_TABLET | Freq: Every day | ORAL | 3 refills | Status: DC

## 2018-08-20 NOTE — Assessment & Plan Note (Signed)
Check labs con't meds 

## 2018-08-20 NOTE — Assessment & Plan Note (Signed)
Encouraged heart healthy diet, increase exercise, avoid trans fats, consider a krill oil cap daily 

## 2018-08-20 NOTE — Progress Notes (Signed)
Patient ID: [name], female    DOB: 1951-04-09  Age: 68 y.o. MRN: [id number]    Subjective:  Subjective  HPI [name] presents for er f/u for anxiety but chest pain has resolved.  She now just c/o nausea.  Oncology ordered carafate but her ins will not pay for the liquid and the capsules do not work as well.  Pt nausea comes and goes    She also c/o l flank pain pain and has recently got over a yeast infection  Pt tx for vaginitis with diflucan but still has symptoms  Review of Systems  Constitutional: Negative for appetite change, diaphoresis, fatigue and unexpected weight change.  Eyes: Negative for pain, redness and visual disturbance.  Respiratory: Negative for cough, chest tightness, shortness of breath and wheezing.   Cardiovascular: Negative for chest pain, palpitations and leg swelling.  Gastrointestinal: Positive for abdominal pain and nausea.  Endocrine: Negative for cold intolerance, heat intolerance, polydipsia, polyphagia and polyuria.  Genitourinary: Negative for difficulty urinating, dysuria and frequency.  Neurological: Negative for dizziness, light-headedness, numbness and headaches.    History Past Medical History:  Diagnosis Date  . Asthma   . Degenerative joint disease   . Diabetes mellitus   . Dyspnea   . GERD (gastroesophageal reflux disease)   . Hyperlipidemia    no longer on medication for this  . Hypertension    patient denies  . Neuropathy    bilateral hands and feet  . Sickle cell trait ([place])   . Sleep apnea   . stomach ca dx'd 2010   chemo/xrt comp 05/2009  . TIA (transient ischemic attack) 07/2015    She has a past surgical history that includes Total knee arthroplasty; Total hip arthroplasty; Shoulder surgery; Appendectomy; Cesarean section; Hernia repair; Esophagogastroduodenoscopy (N/A, 10/15/2012); Esophagogastroduodenoscopy (N/A, 01/15/2016); Colon surgery; and Colonoscopy with propofol (N/A, 06/01/2016).   Her family history includes  Alzheimer's disease (age of onset: 36) in her father; Cancer in her maternal grandfather; Cancer (age of onset: 71) in her mother; Diabetes in her brother and maternal grandmother.She reports that she quit smoking about 29 years ago. Her smoking use included cigarettes. She started smoking about 44 years ago. She has a 15.00 pack-year smoking history. She has never used smokeless tobacco. She reports that she does not drink alcohol or use drugs.  Current Outpatient Medications on File Prior to Visit  Medication Sig Dispense Refill  . albuterol (PROVENTIL HFA;VENTOLIN HFA) 108 (90 Base) MCG/ACT inhaler Inhale 2 puffs into the lungs every 6 (six) hours as needed for wheezing or shortness of breath. 1 Inhaler 2  . albuterol (PROVENTIL) (2.5 MG/3ML) 0.083% nebulizer solution Take 3 mLs (2.5 mg total) by nebulization every 6 (six) hours as needed for wheezing or shortness of breath. 75 mL 12  . aspirin 81 MG tablet Take 81 mg by mouth at bedtime.    . fluticasone (FLONASE) 50 MCG/ACT nasal spray Place 2 sprays into both nostrils daily. 16 g 5  . gabapentin (NEURONTIN) 400 MG capsule Take 2 capsules (800 mg total) by mouth 3 (three) times daily. 180 capsule 7  . glimepiride (AMARYL) 4 MG tablet Take 1 tablet (4 mg total) by mouth 2 (two) times daily. 180 tablet 1  . ipratropium-albuterol (DUONEB) 0.5-2.5 (3) MG/3ML SOLN Take 3 mLs by nebulization every 6 (six) hours as needed. (Patient taking differently: Take 3 mLs by nebulization every 6 (six) hours as needed (sob and wheezing). ) 360 mL 2  . metFORMIN (  GLUCOPHAGE) 500 MG tablet Take 1 tablet (500 mg total) by mouth 2 (two) times daily with a meal. 180 tablet 3  . Multiple Vitamin (MULTIVITAMIN WITH MINERALS) TABS tablet Take 1 tablet by mouth daily.    . sertraline (ZOLOFT) 50 MG tablet TAKE 1 TABLET BY MOUTH EVERY DAY 90 tablet 1  . sucralfate (CARAFATE) 1 g tablet Take 1 tablet (1 g total) by mouth 2 (two) times daily. 60 tablet 3  . traMADol (ULTRAM)  50 MG tablet TAKE 2 TABLETS BY MOUTH 3 TIMES DAILY AS NEEDED FOR MODERATE PAIN 180 tablet 0   Current Facility-Administered Medications on File Prior to Visit  Medication Dose Route Frequency Provider Last Rate Last Dose  . sodium chloride flush (NS) 0.9 % injection 10 mL  10 mL Intravenous PRN [place], Holli Humbles, NP   10 mL at 09/13/16 1146     Objective:  Objective  Physical Exam Vitals signs and nursing note reviewed.  Constitutional:      Appearance: She is well-developed.  HENT:     Head: Normocephalic and atraumatic.  Eyes:     Conjunctiva/sclera: Conjunctivae normal.  Neck:     Musculoskeletal: Normal range of motion and neck supple.     Thyroid: No thyromegaly.     Vascular: No carotid bruit or JVD.  Cardiovascular:     Rate and Rhythm: Normal rate and regular rhythm.     Heart sounds: Normal heart sounds. No murmur.  Pulmonary:     Effort: Pulmonary effort is normal. No respiratory distress.     Breath sounds: Normal breath sounds. No wheezing or rales.  Chest:     Chest wall: No tenderness.  Abdominal:     General: Bowel sounds are normal.     Palpations: Abdomen is soft.     Tenderness: There is abdominal tenderness in the right upper quadrant, epigastric area and left upper quadrant. There is no guarding or rebound.  Neurological:     Mental Status: She is alert and oriented to person, place, and time.    BP 116/80 (BP Location: Right Arm, Patient Position: Sitting, Cuff Size: Large)   Pulse 97   Ht 5' 6"  (1.676 m)   Wt 231 lb (104.8 kg)   SpO2 97%   BMI 37.28 kg/m  Wt Readings from Last 3 Encounters:  08/20/18 231 lb (104.8 kg)  08/12/18 220 lb (99.8 kg)  06/04/18 231 lb 12 oz (105.1 kg)     Lab Results  Component Value Date   WBC 7.6 08/20/2018   HGB 13.5 08/20/2018   HCT 41.9 08/20/2018   PLT 323.0 08/20/2018   GLUCOSE 270 (H) 08/20/2018   CHOL 222 (H) 08/20/2018   TRIG 213.0 (H) 08/20/2018   HDL 51.00 08/20/2018   LDLDIRECT 136.0  08/20/2018   LDLCALC 102 (H) 04/09/2018   ALT 12 08/20/2018   AST 12 08/20/2018   NA 139 08/20/2018   K 5.5 (H) 08/20/2018   CL 102 08/20/2018   CREATININE 1.19 08/20/2018   BUN 16 08/20/2018   CO2 28 08/20/2018   TSH 3.061 05/13/2016   INR 0.98 11/05/2010   HGBA1C 12.5 (H) 04/09/2018   MICROALBUR <0.7 08/20/2018    Dg Chest 2 View  Result Date: 08/12/2018 CLINICAL DATA:  Pt. Complains of "heaviness" in chest since this AM with SOB. Pain in bilateral shoulders with numbness in left hand/fingers. Hx of Diabetes Mellitus.CP EXAM: CHEST - 2 VIEW COMPARISON:  12/05/2017 FINDINGS: Port in the anterior chest wall  with tip in distal SVC. Normal cardiac silhouette ectatic aorta. Low lung volumes. No effusion, infiltrate or pneumothorax. Degenerate changes shoulders. IMPRESSION: No acute cardiopulmonary process. Electronically Signed   By: Suzy Bouchard M.D.   On: 08/12/2018 23:17     Assessment & Plan:  Plan  I am having Merlean G. Morison maintain her multivitamin with minerals, aspirin, ipratropium-albuterol, gabapentin, albuterol, albuterol, metFORMIN, glimepiride, fluticasone, sertraline, sucralfate, traMADol, and pantoprazole.  Meds ordered this encounter  Medications  . DISCONTD: pantoprazole (PROTONIX) 40 MG tablet    Sig: Take 1 tablet (40 mg total) by mouth daily.    Dispense:  30 tablet    Refill:  3  . pantoprazole (PROTONIX) 40 MG tablet    Sig: Take 1 tablet (40 mg total) by mouth daily.    Dispense:  30 tablet    Refill:  3    Problem List Items Addressed This Visit      Unprioritized   Acute vaginitis   Relevant Orders   Urine cytology ancillary only([place])   Essential hypertension    Well controlled, no changes to meds. Encouraged heart healthy diet such as the DASH diet and exercise as tolerated.        Relevant Orders   Lipid panel (Completed)   Hemoglobin A1c   CBC with Differential/Platelet (Completed)   Comprehensive metabolic panel (Completed)    Microalbumin / creatinine urine ratio (Completed)   POCT Urinalysis Dipstick (Automated) (Completed)   Hyperlipidemia associated with type 2 diabetes mellitus ([place])    Encouraged heart healthy diet, increase exercise, avoid trans fats, consider a krill oil cap daily      Relevant Orders   Lipid panel (Completed)   Hemoglobin A1c   CBC with Differential/Platelet (Completed)   Comprehensive metabolic panel (Completed)   Microalbumin / creatinine urine ratio (Completed)   POCT Urinalysis Dipstick (Automated) (Completed)   Nausea - Primary    protonix daily Check labs  Pt has been to [facility] --- may need referral back there      Relevant Medications   pantoprazole (PROTONIX) 40 MG tablet   Other Relevant Orders   H. pylori antibody, IgG (Completed)   Uncontrolled type 2 diabetes mellitus with hyperglycemia ([place])    Check labs con't meds       Relevant Orders   Lipid panel (Completed)   Hemoglobin A1c   CBC with Differential/Platelet (Completed)   Comprehensive metabolic panel (Completed)   Microalbumin / creatinine urine ratio (Completed)   POCT Urinalysis Dipstick (Automated) (Completed)      Follow-up: Return in about 3 months (around 11/19/2018), or if symptoms worsen or fail to improve.  Ann Held, DO

## 2018-08-20 NOTE — Assessment & Plan Note (Signed)
Well controlled, no changes to meds. Encouraged heart healthy diet such as the DASH diet and exercise as tolerated.  °

## 2018-08-20 NOTE — Assessment & Plan Note (Signed)
protonix daily Check labs  Pt has been to [facility] --- may need referral back there

## 2018-08-20 NOTE — Patient Instructions (Signed)

## 2018-08-21 LAB — HEMOGLOBIN A1C

## 2018-08-21 LAB — URINE CYTOLOGY ANCILLARY ONLY
CHLAMYDIA, DNA PROBE: NEGATIVE
Neisseria Gonorrhea: NEGATIVE
Trichomonas: NEGATIVE

## 2018-08-23 ENCOUNTER — Telehealth: Payer: Self-pay | Admitting: *Deleted

## 2018-08-23 LAB — URINE CYTOLOGY ANCILLARY ONLY
Bacterial vaginitis: NEGATIVE
Candida vaginitis: NEGATIVE

## 2018-08-23 NOTE — Telephone Encounter (Signed)
Copied from [place] [id number]. Topic: General - Other >> Aug 23, 2018  3:21 PM Carolyn Stare wrote:  Pt would like a call back labs

## 2018-08-24 ENCOUNTER — Other Ambulatory Visit: Payer: Self-pay | Admitting: Family Medicine

## 2018-08-24 ENCOUNTER — Other Ambulatory Visit: Payer: Self-pay | Admitting: *Deleted

## 2018-08-24 MED ORDER — INSULIN GLARGINE 100 UNIT/ML SOLOSTAR PEN: 20.0000 [IU] | PEN_INJECTOR | Freq: Every day | SUBCUTANEOUS | 99 refills | Status: DC

## 2018-08-25 DIAGNOSIS — I219 Acute myocardial infarction, unspecified: Secondary | ICD-10-CM

## 2018-08-25 HISTORY — DX: Acute myocardial infarction, unspecified: I21.9

## 2018-08-27 ENCOUNTER — Encounter (HOSPITAL_COMMUNITY)
Admission: EM | Disposition: A | Discharge status: Home / Self Care | Payer: Self-pay | Source: Home / Self Care | Attending: Cardiology

## 2018-08-27 ENCOUNTER — Inpatient Hospital Stay (HOSPITAL_COMMUNITY)
Admission: EM | Admit: 2018-08-27 | Discharge: 2018-08-30 | DRG: 247 | Disposition: A | Discharge status: Home / Self Care | Payer: Medicare Other | Attending: Cardiology | Admitting: Cardiology

## 2018-08-27 ENCOUNTER — Telehealth: Payer: Self-pay | Admitting: *Deleted

## 2018-08-27 ENCOUNTER — Other Ambulatory Visit: Payer: Self-pay

## 2018-08-27 DIAGNOSIS — R0602 Shortness of breath: Secondary | ICD-10-CM | POA: Diagnosis not present

## 2018-08-27 DIAGNOSIS — Z96652 Presence of left artificial knee joint: Secondary | ICD-10-CM | POA: Diagnosis not present

## 2018-08-27 DIAGNOSIS — Z955 Presence of coronary angioplasty implant and graft: Secondary | ICD-10-CM

## 2018-08-27 DIAGNOSIS — I25110 Atherosclerotic heart disease of native coronary artery with unstable angina pectoris: Secondary | ICD-10-CM | POA: Diagnosis present

## 2018-08-27 DIAGNOSIS — E1121 Type 2 diabetes mellitus with diabetic nephropathy: Secondary | ICD-10-CM | POA: Diagnosis present

## 2018-08-27 DIAGNOSIS — M1990 Unspecified osteoarthritis, unspecified site: Secondary | ICD-10-CM | POA: Diagnosis not present

## 2018-08-27 DIAGNOSIS — Z833 Family history of diabetes mellitus: Secondary | ICD-10-CM

## 2018-08-27 DIAGNOSIS — E1169 Type 2 diabetes mellitus with other specified complication: Secondary | ICD-10-CM | POA: Diagnosis present

## 2018-08-27 DIAGNOSIS — G4733 Obstructive sleep apnea (adult) (pediatric): Secondary | ICD-10-CM | POA: Diagnosis not present

## 2018-08-27 DIAGNOSIS — I10 Essential (primary) hypertension: Secondary | ICD-10-CM | POA: Diagnosis not present

## 2018-08-27 DIAGNOSIS — D573 Sickle-cell trait: Secondary | ICD-10-CM | POA: Diagnosis present

## 2018-08-27 DIAGNOSIS — G40301 Generalized idiopathic epilepsy and epileptic syndromes, not intractable, with status epilepticus: Secondary | ICD-10-CM | POA: Diagnosis present

## 2018-08-27 DIAGNOSIS — R110 Nausea: Secondary | ICD-10-CM | POA: Diagnosis not present

## 2018-08-27 DIAGNOSIS — Z96641 Presence of right artificial hip joint: Secondary | ICD-10-CM | POA: Diagnosis present

## 2018-08-27 DIAGNOSIS — J449 Chronic obstructive pulmonary disease, unspecified: Secondary | ICD-10-CM | POA: Diagnosis present

## 2018-08-27 DIAGNOSIS — Z8673 Personal history of transient ischemic attack (TIA), and cerebral infarction without residual deficits: Secondary | ICD-10-CM | POA: Diagnosis not present

## 2018-08-27 DIAGNOSIS — I2119 ST elevation (STEMI) myocardial infarction involving other coronary artery of inferior wall: Secondary | ICD-10-CM | POA: Diagnosis not present

## 2018-08-27 DIAGNOSIS — Z79899 Other long term (current) drug therapy: Secondary | ICD-10-CM

## 2018-08-27 DIAGNOSIS — Z87891 Personal history of nicotine dependence: Secondary | ICD-10-CM

## 2018-08-27 DIAGNOSIS — Z85028 Personal history of other malignant neoplasm of stomach: Secondary | ICD-10-CM | POA: Diagnosis not present

## 2018-08-27 DIAGNOSIS — I2111 ST elevation (STEMI) myocardial infarction involving right coronary artery: Principal | ICD-10-CM | POA: Diagnosis present

## 2018-08-27 DIAGNOSIS — K219 Gastro-esophageal reflux disease without esophagitis: Secondary | ICD-10-CM | POA: Diagnosis present

## 2018-08-27 DIAGNOSIS — Z7982 Long term (current) use of aspirin: Secondary | ICD-10-CM

## 2018-08-27 DIAGNOSIS — Z6837 Body mass index (BMI) 37.0-37.9, adult: Secondary | ICD-10-CM

## 2018-08-27 DIAGNOSIS — G4730 Sleep apnea, unspecified: Secondary | ICD-10-CM | POA: Diagnosis not present

## 2018-08-27 DIAGNOSIS — K0889 Other specified disorders of teeth and supporting structures: Secondary | ICD-10-CM | POA: Diagnosis not present

## 2018-08-27 DIAGNOSIS — Z794 Long term (current) use of insulin: Secondary | ICD-10-CM

## 2018-08-27 DIAGNOSIS — E669 Obesity, unspecified: Secondary | ICD-10-CM | POA: Diagnosis present

## 2018-08-27 DIAGNOSIS — I25118 Atherosclerotic heart disease of native coronary artery with other forms of angina pectoris: Secondary | ICD-10-CM | POA: Clinically undetermined

## 2018-08-27 DIAGNOSIS — I213 ST elevation (STEMI) myocardial infarction of unspecified site: Secondary | ICD-10-CM | POA: Diagnosis not present

## 2018-08-27 DIAGNOSIS — E785 Hyperlipidemia, unspecified: Secondary | ICD-10-CM | POA: Diagnosis not present

## 2018-08-27 DIAGNOSIS — G629 Polyneuropathy, unspecified: Secondary | ICD-10-CM | POA: Diagnosis present

## 2018-08-27 DIAGNOSIS — R079 Chest pain, unspecified: Secondary | ICD-10-CM | POA: Diagnosis not present

## 2018-08-27 DIAGNOSIS — R0789 Other chest pain: Secondary | ICD-10-CM | POA: Diagnosis not present

## 2018-08-27 DIAGNOSIS — Z91048 Other nonmedicinal substance allergy status: Secondary | ICD-10-CM

## 2018-08-27 DIAGNOSIS — I2510 Atherosclerotic heart disease of native coronary artery without angina pectoris: Secondary | ICD-10-CM | POA: Diagnosis not present

## 2018-08-27 DIAGNOSIS — Z880 Allergy status to penicillin: Secondary | ICD-10-CM

## 2018-08-27 DIAGNOSIS — Z885 Allergy status to narcotic agent status: Secondary | ICD-10-CM

## 2018-08-27 HISTORY — PX: CORONARY/GRAFT ACUTE MI REVASCULARIZATION: CATH118305

## 2018-08-27 HISTORY — DX: Acute myocardial infarction, unspecified: I21.9

## 2018-08-27 HISTORY — PX: LEFT HEART CATH AND CORONARY ANGIOGRAPHY: CATH118249

## 2018-08-27 LAB — CBC WITH DIFFERENTIAL/PLATELET
Abs Immature Granulocytes: 0.02 10*3/uL (ref 0.00–0.07)
BASOS ABS: 0.1 10*3/uL (ref 0.0–0.1)
Basophils Relative: 1 %
EOS PCT: 3 %
Eosinophils Absolute: 0.3 10*3/uL (ref 0.0–0.5)
HCT: 39 % (ref 36.0–46.0)
Hemoglobin: 12.3 g/dL (ref 12.0–15.0)
IMMATURE GRANULOCYTES: 0 %
LYMPHS ABS: 3.5 10*3/uL (ref 0.7–4.0)
Lymphocytes Relative: 37 %
MCH: 27.7 pg (ref 26.0–34.0)
MCHC: 31.5 g/dL (ref 30.0–36.0)
MCV: 87.8 fL (ref 80.0–100.0)
Monocytes Absolute: 0.8 10*3/uL (ref 0.1–1.0)
Monocytes Relative: 8 %
Neutro Abs: 4.7 10*3/uL (ref 1.7–7.7)
Neutrophils Relative %: 51 %
Platelets: 302 10*3/uL (ref 150–400)
RBC: 4.44 MIL/uL (ref 3.87–5.11)
RDW: 13.6 % (ref 11.5–15.5)
WBC: 9.4 10*3/uL (ref 4.0–10.5)
nRBC: 0 % (ref 0.0–0.2)

## 2018-08-27 LAB — COMPREHENSIVE METABOLIC PANEL
ALT: 16 U/L (ref 0–44)
AST: 19 U/L (ref 15–41)
Albumin: 3.5 g/dL (ref 3.5–5.0)
Alkaline Phosphatase: 104 U/L (ref 38–126)
Anion gap: 13 (ref 5–15)
BUN: 18 mg/dL (ref 8–23)
CO2: 19 mmol/L — ABNORMAL LOW (ref 22–32)
Calcium: 9 mg/dL (ref 8.9–10.3)
Chloride: 106 mmol/L (ref 98–111)
Creatinine, Ser: 1.33 mg/dL — ABNORMAL HIGH (ref 0.44–1.00)
GFR calc Af Amer: 48 mL/min — ABNORMAL LOW (ref >60)
GFR, EST NON AFRICAN AMERICAN: 42 mL/min — AB (ref >60)
Glucose, Bld: 460 mg/dL — ABNORMAL HIGH (ref 70–99)
Potassium: 4.7 mmol/L (ref 3.5–5.1)
Sodium: 138 mmol/L (ref 135–145)
Total Bilirubin: 0.6 mg/dL (ref 0.3–1.2)
Total Protein: 7 g/dL (ref 6.5–8.1)

## 2018-08-27 LAB — PROTIME-INR
INR: 1.04
Prothrombin Time: 13.5 seconds (ref 11.4–15.2)

## 2018-08-27 LAB — LIPID PANEL
Cholesterol: 188 mg/dL (ref 0–200)
HDL: 47 mg/dL (ref >40)
LDL Cholesterol: 101 mg/dL — ABNORMAL HIGH (ref 0–99)
Total CHOL/HDL Ratio: 4 RATIO
Triglycerides: 199 mg/dL — ABNORMAL HIGH (ref <150)
VLDL: 40 mg/dL (ref 0–40)

## 2018-08-27 LAB — APTT: APTT: 24 s (ref 24–36)

## 2018-08-27 LAB — TROPONIN I: Troponin I: <0.03 ng/mL (ref <0.03)

## 2018-08-27 SURGERY — CORONARY/GRAFT ACUTE MI REVASCULARIZATION
Anesthesia: LOCAL

## 2018-08-27 MED ORDER — MIDAZOLAM HCL 2 MG/2ML IJ SOLN
INTRAMUSCULAR | Status: AC
  Filled 2018-08-27: qty 2

## 2018-08-27 MED ORDER — SODIUM CHLORIDE 0.9 % IV SOLN
INTRAVENOUS | Status: AC | PRN
  Administered 2018-08-27: 104 mL/h via INTRAVENOUS

## 2018-08-27 MED ORDER — TIROFIBAN HCL IN NACL 5-0.9 MG/100ML-% IV SOLN
INTRAVENOUS | Status: AC | PRN
  Administered 2018-08-27: 0.15 ug/kg/min via INTRAVENOUS

## 2018-08-27 MED ORDER — ACETAMINOPHEN 325 MG PO TABS
650.0000 mg | ORAL_TABLET | ORAL | Status: DC | PRN
  Administered 2018-08-28 – 2018-08-29 (×3): 650 mg via ORAL
  Filled 2018-08-27 (×3): qty 2

## 2018-08-27 MED ORDER — HEPARIN SODIUM (PORCINE) 5000 UNIT/ML IJ SOLN
4000.0000 [IU] | Freq: Once | INTRAMUSCULAR | Status: AC
  Administered 2018-08-27: 4000 [IU] via INTRAVENOUS
  Filled 2018-08-27: qty 1

## 2018-08-27 MED ORDER — ONDANSETRON HCL 4 MG/2ML IJ SOLN: 4.0000 mg | Freq: Four times a day (QID) | INTRAMUSCULAR | Status: DC | PRN

## 2018-08-27 MED ORDER — SERTRALINE HCL 50 MG PO TABS
50.0000 mg | ORAL_TABLET | Freq: Every day | ORAL | Status: DC
  Administered 2018-08-28 – 2018-08-30 (×3): 50 mg via ORAL
  Filled 2018-08-27 (×4): qty 1

## 2018-08-27 MED ORDER — MORPHINE SULFATE (PF) 2 MG/ML IV SOLN: 2.0000 mg | INTRAVENOUS | Status: DC | PRN

## 2018-08-27 MED ORDER — ASPIRIN EC 81 MG PO TBEC
81.0000 mg | DELAYED_RELEASE_TABLET | Freq: Every day | ORAL | Status: DC
  Administered 2018-08-28: 81 mg via ORAL
  Filled 2018-08-27: qty 1

## 2018-08-27 MED ORDER — GABAPENTIN 400 MG PO CAPS
800.0000 mg | ORAL_CAPSULE | Freq: Three times a day (TID) | ORAL | Status: DC
  Administered 2018-08-28 – 2018-08-30 (×7): 800 mg via ORAL
  Filled 2018-08-27 (×8): qty 2

## 2018-08-27 MED ORDER — HEPARIN SODIUM (PORCINE) 1000 UNIT/ML IJ SOLN
INTRAMUSCULAR | Status: DC | PRN
  Administered 2018-08-27: 6000 [IU] via INTRAVENOUS

## 2018-08-27 MED ORDER — LIDOCAINE HCL (PF) 1 % IJ SOLN
INTRAMUSCULAR | Status: AC
  Filled 2018-08-27: qty 30

## 2018-08-27 MED ORDER — MORPHINE SULFATE (PF) 4 MG/ML IV SOLN
4.0000 mg | Freq: Once | INTRAVENOUS | Status: AC
  Administered 2018-08-27: 4 mg via INTRAVENOUS
  Filled 2018-08-27: qty 1

## 2018-08-27 MED ORDER — NITROGLYCERIN 1 MG/10 ML FOR IR/CATH LAB
INTRA_ARTERIAL | Status: AC
  Filled 2018-08-27: qty 10

## 2018-08-27 MED ORDER — SODIUM CHLORIDE 0.9 % IV SOLN
INTRAVENOUS | Status: AC
  Administered 2018-08-27: via INTRAVENOUS

## 2018-08-27 MED ORDER — TICAGRELOR 90 MG PO TABS
ORAL_TABLET | ORAL | Status: AC
  Filled 2018-08-27: qty 2

## 2018-08-27 MED ORDER — TIROFIBAN (AGGRASTAT) BOLUS VIA INFUSION
INTRAVENOUS | Status: DC | PRN
  Administered 2018-08-27: 2607.5 ug via INTRAVENOUS

## 2018-08-27 MED ORDER — HEPARIN (PORCINE) IN NACL 1000-0.9 UT/500ML-% IV SOLN
INTRAVENOUS | Status: DC | PRN
  Administered 2018-08-27 (×2): 500 mL

## 2018-08-27 MED ORDER — IOHEXOL 350 MG/ML SOLN
INTRAVENOUS | Status: DC | PRN
  Administered 2018-08-27: 205 mL via INTRAVENOUS

## 2018-08-27 MED ORDER — NITROGLYCERIN 1 MG/10 ML FOR IR/CATH LAB
INTRA_ARTERIAL | Status: DC | PRN
  Administered 2018-08-27: 200 ug via INTRACORONARY

## 2018-08-27 MED ORDER — HEPARIN (PORCINE) IN NACL 1000-0.9 UT/500ML-% IV SOLN
INTRAVENOUS | Status: AC
  Filled 2018-08-27: qty 1000

## 2018-08-27 MED ORDER — SODIUM CHLORIDE 0.9 % IV SOLN: 250.0000 mL | INTRAVENOUS | Status: DC | PRN

## 2018-08-27 MED ORDER — VERAPAMIL HCL 2.5 MG/ML IV SOLN
INTRAVENOUS | Status: DC | PRN
  Administered 2018-08-27: 10 mL via INTRA_ARTERIAL

## 2018-08-27 MED ORDER — INSULIN GLARGINE 100 UNIT/ML ~~LOC~~ SOLN
20.0000 [IU] | Freq: Every day | SUBCUTANEOUS | Status: DC
  Administered 2018-08-28 – 2018-08-30 (×3): 20 [IU] via SUBCUTANEOUS
  Filled 2018-08-27 (×3): qty 0.2

## 2018-08-27 MED ORDER — ASPIRIN 81 MG PO CHEW
324.0000 mg | CHEWABLE_TABLET | Freq: Once | ORAL | Status: DC
  Filled 2018-08-27: qty 4

## 2018-08-27 MED ORDER — VERAPAMIL HCL 2.5 MG/ML IV SOLN
INTRAVENOUS | Status: AC
  Filled 2018-08-27: qty 2

## 2018-08-27 MED ORDER — SODIUM CHLORIDE 0.9 % IV SOLN
INTRAVENOUS | Status: DC
  Administered 2018-08-27: 100 mL/h via INTRAVENOUS

## 2018-08-27 MED ORDER — ROSUVASTATIN CALCIUM 20 MG PO TABS
40.0000 mg | ORAL_TABLET | Freq: Every day | ORAL | Status: DC
  Administered 2018-08-28 – 2018-08-29 (×3): 40 mg via ORAL
  Filled 2018-08-27 (×3): qty 2

## 2018-08-27 MED ORDER — FLUTICASONE PROPIONATE 50 MCG/ACT NA SUSP
2.0000 | Freq: Every day | NASAL | Status: DC
  Administered 2018-08-28 – 2018-08-29 (×2): 2 via NASAL
  Filled 2018-08-27: qty 16

## 2018-08-27 MED ORDER — IPRATROPIUM-ALBUTEROL 0.5-2.5 (3) MG/3ML IN SOLN: 3.0000 mL | Freq: Four times a day (QID) | RESPIRATORY_TRACT | Status: DC | PRN

## 2018-08-27 MED ORDER — ALBUTEROL SULFATE (2.5 MG/3ML) 0.083% IN NEBU: 2.5000 mg | INHALATION_SOLUTION | Freq: Four times a day (QID) | RESPIRATORY_TRACT | Status: DC | PRN

## 2018-08-27 MED ORDER — SODIUM CHLORIDE 0.9% FLUSH
3.0000 mL | Freq: Two times a day (BID) | INTRAVENOUS | Status: DC
  Administered 2018-08-28 – 2018-08-29 (×4): 3 mL via INTRAVENOUS

## 2018-08-27 MED ORDER — MIDAZOLAM HCL 2 MG/2ML IJ SOLN
INTRAMUSCULAR | Status: DC | PRN
  Administered 2018-08-27: 2 mg via INTRAVENOUS

## 2018-08-27 MED ORDER — TICAGRELOR 90 MG PO TABS
ORAL_TABLET | ORAL | Status: DC | PRN
  Administered 2018-08-27: 180 mg via ORAL

## 2018-08-27 MED ORDER — FENTANYL CITRATE (PF) 100 MCG/2ML IJ SOLN
INTRAMUSCULAR | Status: AC
  Filled 2018-08-27: qty 2

## 2018-08-27 MED ORDER — LABETALOL HCL 5 MG/ML IV SOLN: 10.0000 mg | INTRAVENOUS | Status: AC | PRN

## 2018-08-27 MED ORDER — HYDRALAZINE HCL 20 MG/ML IJ SOLN: 5.0000 mg | INTRAMUSCULAR | Status: AC | PRN

## 2018-08-27 MED ORDER — TICAGRELOR 90 MG PO TABS
90.0000 mg | ORAL_TABLET | Freq: Two times a day (BID) | ORAL | Status: DC
  Administered 2018-08-28 – 2018-08-30 (×5): 90 mg via ORAL
  Filled 2018-08-27 (×5): qty 1

## 2018-08-27 MED ORDER — LIDOCAINE HCL (PF) 1 % IJ SOLN
INTRAMUSCULAR | Status: DC | PRN
  Administered 2018-08-27: 2 mL via INTRADERMAL

## 2018-08-27 MED ORDER — SODIUM CHLORIDE 0.9% FLUSH
3.0000 mL | INTRAVENOUS | Status: DC | PRN
  Administered 2018-08-29: 3 mL via INTRAVENOUS
  Filled 2018-08-27: qty 3

## 2018-08-27 MED ORDER — METOPROLOL TARTRATE 25 MG PO TABS
25.0000 mg | ORAL_TABLET | Freq: Two times a day (BID) | ORAL | Status: DC
  Administered 2018-08-28 – 2018-08-30 (×6): 25 mg via ORAL
  Filled 2018-08-27 (×6): qty 1

## 2018-08-27 MED ORDER — ROSUVASTATIN CALCIUM 5 MG PO TABS: ORAL_TABLET | ORAL | 2 refills | Status: DC

## 2018-08-27 MED ORDER — FENTANYL CITRATE (PF) 100 MCG/2ML IJ SOLN
INTRAMUSCULAR | Status: DC | PRN
  Administered 2018-08-27 (×4): 25 ug via INTRAVENOUS

## 2018-08-27 MED ORDER — ONDANSETRON HCL 4 MG/2ML IJ SOLN
4.0000 mg | Freq: Once | INTRAMUSCULAR | Status: AC
  Administered 2018-08-27: 4 mg via INTRAVENOUS
  Filled 2018-08-27: qty 2

## 2018-08-27 SURGICAL SUPPLY — 20 items
BALLN SAPPHIRE 2.0X12 (BALLOONS) ×2
BALLN SAPPHIRE ~~LOC~~ 3.0X15 (BALLOONS) ×1 IMPLANT
BALLOON SAPPHIRE 2.0X12 (BALLOONS) IMPLANT
CATH INFINITI 5FR ANG PIGTAIL (CATHETERS) ×1 IMPLANT
CATH LAUNCHER 5F AR1 (CATHETERS) ×1 IMPLANT
CATH OPTITORQUE TIG 4.0 5F (CATHETERS) ×1 IMPLANT
CATH VISTA GUIDE 6FR JR4 (CATHETERS) ×1 IMPLANT
DEVICE RAD COMP TR BAND LRG (VASCULAR PRODUCTS) ×1 IMPLANT
GLIDESHEATH SLEND A-KIT 6F 22G (SHEATH) ×1 IMPLANT
GUIDEWIRE INQWIRE 1.5J.035X260 (WIRE) IMPLANT
INQWIRE 1.5J .035X260CM (WIRE) ×2
KIT ENCORE 26 ADVANTAGE (KITS) ×1 IMPLANT
KIT HEART LEFT (KITS) ×2 IMPLANT
PACK CARDIAC CATHETERIZATION (CUSTOM PROCEDURE TRAY) ×2 IMPLANT
SHEATH PROBE COVER 6X72 (BAG) ×1 IMPLANT
STENT RESOLUTE ONYX 2.75X18 (Permanent Stent) ×1 IMPLANT
SYR MEDRAD MARK 7 150ML (SYRINGE) ×2 IMPLANT
TRANSDUCER W/STOPCOCK (MISCELLANEOUS) ×2 IMPLANT
TUBING CIL FLEX 10 FLL-RA (TUBING) ×2 IMPLANT
WIRE HI TORQ BMW 190CM (WIRE) ×1 IMPLANT

## 2018-08-27 NOTE — ED Notes (Signed)
Cardiologist at bedside.  

## 2018-08-27 NOTE — Telephone Encounter (Signed)
Called her pharmacy, her insurance does cover Lantus but it appears that she has a drug deductible.  Called patient to discuss this with her.  She states that she should be able to afford this drug later, but just does not have much money right now.  We do have some basaglar in the refrigerator, I will put her name on 2 sample pen so she can pick them up and use them for now.  Explained that basilar is essentially the same as Lantus, just a different brand name.  She will use it the same way that she would use Lantus.

## 2018-08-27 NOTE — Telephone Encounter (Signed)
Patient states that Lantus cost for her is $100.  Is there anything else she can take?  [name] advised to take last week because of her labs a1c >14.

## 2018-08-27 NOTE — Telephone Encounter (Signed)
Just seen a request from pharmacy.  They may pay for basaglar better.

## 2018-08-27 NOTE — ED Triage Notes (Signed)
Pt to ED with c/o chest pain radiating into neck onset approx 8:30 pm tonight

## 2018-08-27 NOTE — H&P (Addendum)
Cardiology Admission History and Physical:   Patient ID: [name] MRN: [id number]; DOB: 25-Mar-1951   Admission date: 08/27/2018  Primary Care Provider: Carollee Herter, Alferd Apa, DO Primary Cardiologist: No primary care provider on file. Glenetta Hew, MD  Primary Electrophysiologist:  None   Chief Complaint: Chest pain, inferior STEMI  Patient Profile:   [name] is a 68 y.o. female with history of diabetes, hypertension, obesity and hyperlipidemia who presents with ongoing substernal chest pain that began roughly 8:30 PM.  History of Present Illness:   [name] apparently had an episode of chest pain about 2 weeks ago and was evaluated in the ER with an EKG at that time that was normal.  She ruled out for MI and was discharged home with the thought of it being an anxiety attack.  However today chest pain was much worse associated with dyspnea and nausea as well as a sense of overall knees.  This is definitely worse than the last episode.  Chest pain at worst was 9-10 out of 10.  Currently upon arrival to the ER was 9 out of 10.  She received 4 mg of morphine in the ER after getting 325 mg of aspirin in the EMS.  She received 4000 his IV heparin.  EKG shows subtle inferior ST elevations in III, 2 and aVF along with posterior lead elevation and v4 --code STEMI was called in route and upon arrival.  In 10-10 chest pain.  Brought emergently to cardiac catheterization lab for catheterization and PCI.   Past Medical History:  Diagnosis Date  . Asthma   . Degenerative joint disease   . Diabetes mellitus   . Dyspnea   . GERD (gastroesophageal reflux disease)   . Hyperlipidemia    no longer on medication for this  . Hypertension    patient denies  . Neuropathy    bilateral hands and feet  . Sickle cell trait ([place])   . Sleep apnea   . stomach ca dx'd 2010   chemo/xrt comp 05/2009  . TIA (transient ischemic attack) 07/2015    Past Surgical History:  Procedure Laterality Date    . APPENDECTOMY    . CESAREAN SECTION    . COLON SURGERY     colonscopy  . COLONOSCOPY WITH PROPOFOL N/A 06/01/2016   Procedure: COLONOSCOPY WITH PROPOFOL;  Surgeon: Wilford Corner, MD;  Location: [facility] ENDOSCOPY;  Service: Endoscopy;  Laterality: N/A;  . ESOPHAGOGASTRODUODENOSCOPY N/A 10/15/2012   Procedure: ESOPHAGOGASTRODUODENOSCOPY (EGD);  Surgeon: Lear Ng, MD;  Location: [facility] ENDOSCOPY;  Service: Endoscopy;  Laterality: N/A;  . ESOPHAGOGASTRODUODENOSCOPY N/A 01/15/2016   Procedure: ESOPHAGOGASTRODUODENOSCOPY (EGD);  Surgeon: Ronald Lobo, MD;  Location: [facility] ENDOSCOPY;  Service: Endoscopy;  Laterality: N/A;  . HERNIA REPAIR    . SHOULDER SURGERY     Left  . TOTAL HIP ARTHROPLASTY     Right  . TOTAL KNEE ARTHROPLASTY     Left     Medications Prior to Admission: Prior to Admission medications   Medication Sig Start Date End Date Taking? Authorizing Provider  albuterol (PROVENTIL HFA;VENTOLIN HFA) 108 (90 Base) MCG/ACT inhaler Inhale 2 puffs into the lungs every 6 (six) hours as needed for wheezing or shortness of breath. 11/05/17   Roxan Hockey, MD  albuterol (PROVENTIL) (2.5 MG/3ML) 0.083% nebulizer solution Take 3 mLs (2.5 mg total) by nebulization every 6 (six) hours as needed for wheezing or shortness of breath. 11/05/17   Roxan Hockey, MD  aspirin 81 MG tablet Take  81 mg by mouth at bedtime.    [provider]  fluticasone (FLONASE) 50 MCG/ACT nasal spray Place 2 sprays into both nostrils daily. 04/18/18   Ann Held, DO  gabapentin (NEURONTIN) 400 MG capsule Take 2 capsules (800 mg total) by mouth 3 (three) times daily. 08/16/17   Ann Held, DO  glimepiride (AMARYL) 4 MG tablet Take 1 tablet (4 mg total) by mouth 2 (two) times daily. 04/09/18   Ann Held, DO  Insulin Glargine (LANTUS SOLOSTAR) 100 UNIT/ML Solostar Pen Inject 20 Units into the skin daily. 08/24/18   Roma Schanz R, DO  ipratropium-albuterol (DUONEB)  0.5-2.5 (3) MG/3ML SOLN Take 3 mLs by nebulization every 6 (six) hours as needed. Patient taking differently: Take 3 mLs by nebulization every 6 (six) hours as needed (sob and wheezing).  05/26/17   Ann Held, DO  metFORMIN (GLUCOPHAGE) 500 MG tablet Take 1 tablet (500 mg total) by mouth 2 (two) times daily with a meal. 12/05/17   Carollee Herter, Alferd Apa, DO  Multiple Vitamin (MULTIVITAMIN WITH MINERALS) TABS tablet Take 1 tablet by mouth daily.    [provider]  pantoprazole (PROTONIX) 40 MG tablet Take 1 tablet (40 mg total) by mouth daily. 08/20/18   Ann Held, DO  rosuvastatin (CRESTOR) 5 MG tablet Take 1 tablet by mouth Monday, Wednesday, and Friday. 08/27/18   Roma Schanz R, DO  sertraline (ZOLOFT) 50 MG tablet TAKE 1 TABLET BY MOUTH EVERY DAY 07/16/18   Carollee Herter, Alferd Apa, DO  sucralfate (CARAFATE) 1 g tablet Take 1 tablet (1 g total) by mouth 2 (two) times daily. 07/31/18   [place], Holli Humbles, NP  traMADol (ULTRAM) 50 MG tablet TAKE 2 TABLETS BY MOUTH 3 TIMES DAILY AS NEEDED FOR MODERATE PAIN 08/02/18   Volanda Napoleon, MD     Allergies:    Allergies  Allergen Reactions  . Adhesive [Tape] Other (See Comments)    Burn Skin  . Codeine Other (See Comments)    paranoid  . Zocor [Simvastatin - High Dose] Other (See Comments)    Muscle spasms  . Penicillins Rash    Has patient had a PCN reaction causing immediate rash, facial/tongue/throat swelling, SOB or lightheadedness with hypotension: Yes Has patient had a PCN reaction causing severe rash involving mucus membranes or skin necrosis: No Has patient had a PCN reaction that required hospitalization does not remember.  Has patient had a PCN reaction occurring within the last 10 years: No If all of the above answers are "NO", then may proceed with Cephalosporin use.     Social History:   Social History   Socioeconomic History  . Marital status: Divorced    Spouse name: Not on file  . Number of  children: Not on file  . Years of education: Not on file  . Highest education level: Not on file  Occupational History  . Not on file  Social Needs  . Financial resource strain: Not on file  . Food insecurity:    Worry: Not on file    Inability: Not on file  . Transportation needs:    Medical: Not on file    Non-medical: Not on file  Tobacco Use  . Smoking status: Former Smoker    Packs/day: 1.00    Years: 15.00    Pack years: 15.00    Types: Cigarettes    Start date: 09/27/1973    Last attempt to  quit: 10/15/1988    Years since quitting: 29.8  . Smokeless tobacco: Never Used  . Tobacco comment: quit smoking 14 years ago  Substance and Sexual Activity  . Alcohol use: No    Alcohol/week: 0.0 standard drinks  . Drug use: No  . Sexual activity: Never  Lifestyle  . Physical activity:    Days per week: Not on file    Minutes per session: Not on file  . Stress: Not on file  Relationships  . Social connections:    Talks on phone: Not on file    Gets together: Not on file    Attends religious service: Not on file    Active member of club or organization: Not on file    Attends meetings of clubs or organizations: Not on file    Relationship status: Not on file  . Intimate partner violence:    Fear of current or ex partner: Not on file    Emotionally abused: Not on file    Physically abused: Not on file    Forced sexual activity: Not on file  Other Topics Concern  . Not on file  Social History Narrative  . Not on file    Family History:   The patient's family history includes Alzheimer's disease (age of onset: 58) in her father; Cancer in her maternal grandfather; Cancer (age of onset: 2) in her mother; Diabetes in her brother and maternal grandmother. There is no history of Suicidality, Depression, Dementia, or Anxiety disorder.    ROS:  Please see the history of present illness.  All other ROS reviewed and negative.     Physical Exam/Data:   Vitals:   08/27/18 2148  08/27/18 2149  BP:  (!) 151/87  Pulse:  83  Temp:  98.5 F (36.9 C)  TempSrc:  Oral  SpO2:  100%  Weight: 104.3 kg   Height: 5' 6"  (1.676 m)    No intake or output data in the 24 hours ending 08/27/18 2201 Last 3 Weights 08/27/2018 08/20/2018 08/12/2018  Weight (lbs) 230 lb 231 lb 220 lb  Weight (kg) 104.327 kg 104.781 kg 99.791 kg  Some encounter information is confidential and restricted. Go to Review Flowsheets activity to see all data.     Body mass index is 37.12 kg/m.  General:  Well nourished, well developed, in Moderate to severe distress- CP & dyspnea. HEENT: normal, L lower molar with some bleeding Lymph: no adenopathy Neck: no obvious  JVD Endocrine:  No thryomegaly Vascular: No carotid bruits; FA pulses 2+ bilaterally without bruits  Cardiac:  normal S1, S2; RRR; no murmur Lungs:  clear to auscultation bilaterally, no wheezing, rhonchi or rales  Abd: soft, nontender, no hepatomegaly  Ext: no edema Musculoskeletal:  No deformities, BUE and BLE strength normal and equal Skin: warm and dry  Neuro:  CNs 2-12 intact, no focal abnormalities noted Psych:  Normal affect    EKG:  The ECG that was done was personally reviewed and demonstrates inferior ST elevation in II, III, avF  Relevant CV Studies: 01/2016: - Left ventricle: The cavity size was normal. Wall thickness was   increased in a pattern of moderate LVH. Systolic function was   normal. The estimated ejection fraction was in the range of 50%   to 55%. Wall motion was normal; there were no regional wall   motion abnormalities. Doppler parameters are consistent with   abnormal left ventricular relaxation (grade 1 diastolic   dysfunction).  Laboratory Data:  ChemistryNo results  for input(s): NA, K, CL, CO2, GLUCOSE, BUN, CREATININE, CALCIUM, GFRNONAA, GFRAA, ANIONGAP in the last 168 hours.  No results for input(s): PROT, ALBUMIN, AST, ALT, ALKPHOS, BILITOT in the last 168 hours. HematologyNo results for input(s):  WBC, RBC, HGB, HCT, MCV, MCH, MCHC, RDW, PLT in the last 168 hours. Cardiac EnzymesNo results for input(s): TROPONINI in the last 168 hours. No results for input(s): TROPIPOC in the last 168 hours.  BNPNo results for input(s): BNP, PROBNP in the last 168 hours.  DDimer No results for input(s): DDIMER in the last 168 hours.  Radiology/Studies:  No results found.  Assessment and Plan:  68 y.o. female with history of diabetes, hypertension, obesity and hyperlipidemia who presented with chest pain, found to have inferior STEMI  1. Inferior STEMI w/ LCx disease: PCI to RCA, with planned staged LCx intervention on Wednesday. Aspirin, Brilinta, Metop, Statin 2. DMII w/ sugars >400. Lantus and ISS 3. HLD: statin 4. Dental: Left lower molar with bleeding, will need removal.   Severity of Illness: The appropriate patient status for this patient is INPATIENT. Inpatient status is judged to be reasonable and necessary in order to provide the required intensity of service to ensure the patient's safety. The patient's presenting symptoms, physical exam findings, and initial radiographic and laboratory data in the context of their chronic comorbidities is felt to place them at high risk for further clinical deterioration. Furthermore, it is not anticipated that the patient will be medically stable for discharge from the hospital within 2 midnights of admission. The following factors support the patient status of inpatient.   " The patient's presenting symptoms include chest pain. " The worrisome physical exam findings include ECG changes of STEMI " The initial radiographic and laboratory data are worrisome because of Cr 1.3, Glu 460 " The chronic co-morbidities include HTN, DMII  * I certify that at the point of admission it is my clinical judgment that the patient will require inpatient hospital care spanning beyond 2 midnights from the point of admission due to high intensity of service, high risk for  further deterioration and high frequency of surveillance required.*    FULL CODe  Signed, Charlies Silvers, MD

## 2018-08-27 NOTE — ED Provider Notes (Signed)
[facility] CATH LAB Provider Note   CSN: [id number] Arrival date & time: 08/27/18  2143     History   Chief Complaint Chief Complaint  Patient presents with  . Code STEMI    HPI [name] is a 68 y.o. female.  Patient is a 68 year old female with a history of asthma, diabetes, hypertension, hyperlipidemia, prior stomach cancer status post resection and chemotherapy presenting today with sudden onset of chest discomfort that started approximately 1 hour prior to arrival.  Patient states she was eating when she suddenly developed a severe pressure tightening sensation in the center of her chest that radiated up into her throat and made her short of breath and nauseated.  The pain has continued and not improved.  Denies any recent infectious symptoms and also states she has not been dealing with any type of reflux lately.  She has had no recent medication changes is not currently taking chemotherapy.  She has had no cough or congestion or lower extremity swelling.  When paramedics arrived they transmitted an EKG which was starting for inferior MI.  Patient's vital signs have been stable and she was given 325 of aspirin prior to arrival.  She was not given nitroglycerin.  sHe has never had any GI bleeding or any concern for internal bleeding where she cannot have blood thinners.  The history is provided by the patient.    Past Medical History:  Diagnosis Date  . Asthma   . Degenerative joint disease   . Diabetes mellitus   . Dyspnea   . GERD (gastroesophageal reflux disease)   . Hyperlipidemia    no longer on medication for this  . Hypertension    patient denies  . Neuropathy    bilateral hands and feet  . Sickle cell trait ([place])   . Sleep apnea   . stomach ca dx'd 2010   chemo/xrt comp 05/2009  . TIA (transient ischemic attack) 07/2015    Patient Active Problem List   Diagnosis Date Noted  . Acute vaginitis 08/20/2018  . Uncontrolled type 2  diabetes mellitus with hyperglycemia ([place]) 08/20/2018  . Hyperlipidemia associated with type 2 diabetes mellitus ([place]) 04/09/2018  . Bronchopneumonia 11/02/2017  . Sepsis ([place]) 11/02/2017  . Leucocytosis 11/02/2017  . Moderate persistent asthma with acute exacerbation 05/26/2017  . Influenza A 05/26/2017  . Acute pain of right shoulder 03/07/2017  . Acute bilateral low back pain without sciatica 03/07/2017  . Chronic pain 03/07/2017  . Fatty liver 03/24/2016  . Weakness 03/23/2016  . Unexplained weight loss 03/23/2016  . Melena 03/23/2016  . Hypotension 02/03/2016  . Syncope 02/03/2016  . Hypomagnesemia 02/03/2016  . AKI (acute kidney injury) ([place]) 02/03/2016  . Gastrointestinal dysmotility 01/20/2016  . CKD (chronic kidney disease) stage 3, GFR 30-59 ml/min (HCC) 01/20/2016  . Abscess of right thigh 01/20/2016  . Chronic pain syndrome 01/15/2016  . Diarrhea 01/15/2016  . Generalized abdominal pain 01/14/2016  . Type 2 diabetes with nephropathy ([place]) 12/30/2015  . Morbid obesity due to excess calories ([place]) 05/05/2015  . Diabetic polyneuropathy associated with type 2 diabetes mellitus ([place]) 01/02/2015  . Acute bacterial sinusitis 01/02/2015  . Onychomycosis 11/04/2014  . Pain in lower limb 11/04/2014  . Multinodular goiter (nontoxic) 10/16/2014  . CVA (cerebral infarction) 08/11/2014  . Iron deficiency anemia 05/20/2014  . Major depressive disorder, single episode, moderate ([place]) 05/20/2014  . Dizziness of unknown cause 04/28/2014  . MDD (major depressive disorder), recurrent episode, moderate ([place])  01/28/2014  . GERD (gastroesophageal reflux disease) 01/02/2014  . Nausea 10/15/2012  . Stomach cancer ([place]) 12/10/2008  . CHRONIC RESPIRATORY FAILURE 01/03/2008  . Hyperlipidemia 06/27/2007  . OBESITY, MORBID 06/27/2007  . SLEEP APNEA, OBSTRUCTIVE 06/27/2007  . Essential hypertension 06/27/2007  . Asthma 06/27/2007  . OSTEOARTHRITIS  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 06/27/2007      Past Surgical History:  Procedure Laterality Date  . APPENDECTOMY    . CESAREAN SECTION    . COLON SURGERY     colonscopy  . COLONOSCOPY WITH PROPOFOL N/A 06/01/2016   Procedure: COLONOSCOPY WITH PROPOFOL;  Surgeon: Wilford Corner, MD;  Location: [facility] ENDOSCOPY;  Service: Endoscopy;  Laterality: N/A;  . ESOPHAGOGASTRODUODENOSCOPY N/A 10/15/2012   Procedure: ESOPHAGOGASTRODUODENOSCOPY (EGD);  Surgeon: Lear Ng, MD;  Location: [facility] ENDOSCOPY;  Service: Endoscopy;  Laterality: N/A;  . ESOPHAGOGASTRODUODENOSCOPY N/A 01/15/2016   Procedure: ESOPHAGOGASTRODUODENOSCOPY (EGD);  Surgeon: Ronald Lobo, MD;  Location: [facility] ENDOSCOPY;  Service: Endoscopy;  Laterality: N/A;  . HERNIA REPAIR    . SHOULDER SURGERY     Left  . TOTAL HIP ARTHROPLASTY     Right  . TOTAL KNEE ARTHROPLASTY     Left     OB History    Gravida  4   Para  4   Term      Preterm      AB      Living  4     SAB      TAB      Ectopic      Multiple      Live Births               Home Medications    Prior to Admission medications   Medication Sig Start Date End Date Taking? Authorizing Provider  albuterol (PROVENTIL HFA;VENTOLIN HFA) 108 (90 Base) MCG/ACT inhaler Inhale 2 puffs into the lungs every 6 (six) hours as needed for wheezing or shortness of breath. 11/05/17   Roxan Hockey, MD  albuterol (PROVENTIL) (2.5 MG/3ML) 0.083% nebulizer solution Take 3 mLs (2.5 mg total) by nebulization every 6 (six) hours as needed for wheezing or shortness of breath. 11/05/17   Roxan Hockey, MD  aspirin 81 MG tablet Take 81 mg by mouth at bedtime.    [provider]  fluticasone (FLONASE) 50 MCG/ACT nasal spray Place 2 sprays into both nostrils daily. 04/18/18   Ann Held, DO  gabapentin (NEURONTIN) 400 MG capsule Take 2 capsules (800 mg total) by mouth 3 (three) times daily. 08/16/17   Ann Held, DO  glimepiride (AMARYL) 4 MG tablet Take 1 tablet (4 mg total) by mouth  2 (two) times daily. 04/09/18   Ann Held, DO  Insulin Glargine (LANTUS SOLOSTAR) 100 UNIT/ML Solostar Pen Inject 20 Units into the skin daily. 08/24/18   Roma Schanz R, DO  ipratropium-albuterol (DUONEB) 0.5-2.5 (3) MG/3ML SOLN Take 3 mLs by nebulization every 6 (six) hours as needed. Patient taking differently: Take 3 mLs by nebulization every 6 (six) hours as needed (sob and wheezing).  05/26/17   Ann Held, DO  metFORMIN (GLUCOPHAGE) 500 MG tablet Take 1 tablet (500 mg total) by mouth 2 (two) times daily with a meal. 12/05/17   Carollee Herter, Alferd Apa, DO  Multiple Vitamin (MULTIVITAMIN WITH MINERALS) TABS tablet Take 1 tablet by mouth daily.    [provider]  pantoprazole (PROTONIX) 40 MG tablet Take 1 tablet (40 mg  total) by mouth daily. 08/20/18   Ann Held, DO  rosuvastatin (CRESTOR) 5 MG tablet Take 1 tablet by mouth Monday, Wednesday, and Friday. 08/27/18   Roma Schanz R, DO  sertraline (ZOLOFT) 50 MG tablet TAKE 1 TABLET BY MOUTH EVERY DAY 07/16/18   Carollee Herter, Alferd Apa, DO  sucralfate (CARAFATE) 1 g tablet Take 1 tablet (1 g total) by mouth 2 (two) times daily. 07/31/18   [place], Holli Humbles, NP  traMADol (ULTRAM) 50 MG tablet TAKE 2 TABLETS BY MOUTH 3 TIMES DAILY AS NEEDED FOR MODERATE PAIN 08/02/18   Volanda Napoleon, MD    Family History Family History  Problem Relation Age of Onset  . Diabetes Brother   . Alzheimer's disease Father 26  . Cancer Mother 43  . Diabetes Maternal Grandmother   . Cancer Maternal Grandfather        lung  . Suicidality Neg Hx   . Depression Neg Hx   . Dementia Neg Hx   . Anxiety disorder Neg Hx     Social History Social History   Tobacco Use  . Smoking status: Former Smoker    Packs/day: 1.00    Years: 15.00    Pack years: 15.00    Types: Cigarettes    Start date: 09/27/1973    Last attempt to quit: 10/15/1988    Years since quitting: 29.8  . Smokeless tobacco: Never Used  . Tobacco  comment: quit smoking 14 years ago  Substance Use Topics  . Alcohol use: No    Alcohol/week: 0.0 standard drinks  . Drug use: No     Allergies   Adhesive [tape]; Codeine; Zocor [simvastatin - high dose]; and Penicillins   Review of Systems Review of Systems  All other systems reviewed and are negative.    Physical Exam Updated Vital Signs BP (!) 151/87 (BP Location: Left Arm)   Pulse 83   Temp 98.5 F (36.9 C) (Oral)   Ht 5' 6"  (1.676 m)   Wt 104.3 kg   SpO2 100%   BMI 37.12 kg/m   Physical Exam Vitals signs and nursing note reviewed.  Constitutional:      General: She is in acute distress.     Appearance: She is well-developed.     Comments: Appears very uncomfortable  HENT:     Head: Normocephalic and atraumatic.  Eyes:     Pupils: Pupils are equal, round, and reactive to light.  Cardiovascular:     Rate and Rhythm: Normal rate and regular rhythm.     Heart sounds: Normal heart sounds. No murmur. No friction rub.  Pulmonary:     Effort: Pulmonary effort is normal.     Breath sounds: Normal breath sounds. No wheezing or rales.     Comments: Tachypneic Abdominal:     General: Bowel sounds are normal. There is no distension.     Palpations: Abdomen is soft.     Tenderness: There is no abdominal tenderness. There is no guarding or rebound.     Comments: Well-healed midline abdominal scar  Musculoskeletal: Normal range of motion.        General: No tenderness.     Comments: No edema  Skin:    General: Skin is warm and dry.     Findings: No rash.  Neurological:     Mental Status: She is alert and oriented to person, place, and time.     Cranial Nerves: No cranial nerve deficit.  Psychiatric:  Behavior: Behavior normal.      ED Treatments / Results  Labs (all labs ordered are listed, but only abnormal results are displayed) Labs Reviewed  CBC WITH DIFFERENTIAL/PLATELET  PROTIME-INR  APTT  COMPREHENSIVE METABOLIC PANEL  TROPONIN I  LIPID  PANEL    EKG EKG Interpretation  Date/Time:  Monday August 27 2018 21:46:05 EST Ventricular Rate:  79 PR Interval:    QRS Duration: 90 QT Interval:  344 QTC Calculation: 395 R Axis:   11 Text Interpretation:  Sinus rhythm Low voltage, precordial leads new  ST elevation, consider inferior injury Baseline wander in lead(s) III aVF Confirmed by Blanchie Dessert [id number]) on 08/27/2018 9:49:24 PM   Radiology No results found.  Procedures Procedures (including critical care time)  Medications Ordered in ED Medications  0.9 %  sodium chloride infusion (100 mL/hr Intravenous New Bag/Given 08/27/18 2154)  aspirin chewable tablet 324 mg ( Oral MAR Hold 08/27/18 2201)  lidocaine (PF) (XYLOCAINE) 1 % injection (2 mLs Intradermal Given 08/27/18 2213)  fentaNYL (SUBLIMAZE) injection (25 mcg Intravenous Given 08/27/18 2218)  midazolam (VERSED) injection (2 mg Intravenous Given 08/27/18 2214)  0.9 %  sodium chloride infusion (104 mL/hr Intravenous New Bag/Given 08/27/18 2215)  Radial Cocktail/Verapamil only (10 mLs Intra-arterial Given 08/27/18 2217)  morphine 4 MG/ML injection 4 mg (4 mg Intravenous Given 08/27/18 2152)  ondansetron (ZOFRAN) injection 4 mg (4 mg Intravenous Given 08/27/18 2152)  heparin injection 4,000 Units (4,000 Units Intravenous Given 08/27/18 2153)     Initial Impression / Assessment and Plan / ED Course  I have reviewed the triage vital signs and the nursing notes.  Pertinent labs & imaging results that were available during my care of the patient were reviewed by me and considered in my medical decision making (see chart for details).     Pt with symptoms concerning for ACS.  Heart score of 5 . Associated symptoms include nausea and SOB.  Low well's criteria and sx not classic for dissection. ASA given.  Did start with eating so possible GI related but pt today does have inferior EKG changes and with story code stemi was activated and pt taken to cath lab.  Pt given morphine for pain  and started on heparin.  CRITICAL CARE Performed by: Lelar Farewell Total critical care time: 30 minutes Critical care time was exclusive of separately billable procedures and treating other patients. Critical care was necessary to treat or prevent imminent or life-threatening deterioration. Critical care was time spent personally by me on the following activities: development of treatment plan with patient and/or surrogate as well as nursing, discussions with consultants, evaluation of patient's response to treatment, examination of patient, obtaining history from patient or surrogate, ordering and performing treatments and interventions, ordering and review of laboratory studies, ordering and review of radiographic studies, pulse oximetry and re-evaluation of patient's condition.   Final Clinical Impressions(s) / ED Diagnoses   Final diagnoses:  Acute ST elevation myocardial infarction (STEMI) involving right coronary artery [facility])    ED Discharge Orders    None       Blanchie Dessert, MD 08/27/18 2233

## 2018-08-27 NOTE — ED Notes (Signed)
To Cath lab at this time

## 2018-08-27 NOTE — Telephone Encounter (Signed)
Advised patient about lab work on Friday.  Crestor sent to pharmacy

## 2018-08-28 ENCOUNTER — Inpatient Hospital Stay (HOSPITAL_COMMUNITY): Payer: Medicare Other

## 2018-08-28 ENCOUNTER — Encounter: Payer: Self-pay | Admitting: *Deleted

## 2018-08-28 ENCOUNTER — Telehealth: Payer: Self-pay

## 2018-08-28 ENCOUNTER — Telehealth: Payer: Self-pay | Admitting: *Deleted

## 2018-08-28 ENCOUNTER — Encounter (HOSPITAL_COMMUNITY): Payer: Self-pay | Admitting: Cardiology

## 2018-08-28 ENCOUNTER — Other Ambulatory Visit: Payer: Self-pay

## 2018-08-28 DIAGNOSIS — I2119 ST elevation (STEMI) myocardial infarction involving other coronary artery of inferior wall: Secondary | ICD-10-CM

## 2018-08-28 LAB — BASIC METABOLIC PANEL
Anion gap: 9 (ref 5–15)
BUN: 15 mg/dL (ref 8–23)
CO2: 23 mmol/L (ref 22–32)
Calcium: 8.9 mg/dL (ref 8.9–10.3)
Chloride: 106 mmol/L (ref 98–111)
Creatinine, Ser: 1.15 mg/dL — ABNORMAL HIGH (ref 0.44–1.00)
GFR calc non Af Amer: 50 mL/min — ABNORMAL LOW (ref >60)
GFR, EST AFRICAN AMERICAN: 57 mL/min — AB (ref >60)
Glucose, Bld: 297 mg/dL — ABNORMAL HIGH (ref 70–99)
Potassium: 4.6 mmol/L (ref 3.5–5.1)
Sodium: 138 mmol/L (ref 135–145)

## 2018-08-28 LAB — CBC
HCT: 36.1 % (ref 36.0–46.0)
Hemoglobin: 11.4 g/dL — ABNORMAL LOW (ref 12.0–15.0)
MCH: 27.5 pg (ref 26.0–34.0)
MCHC: 31.6 g/dL (ref 30.0–36.0)
MCV: 87.2 fL (ref 80.0–100.0)
NRBC: 0 % (ref 0.0–0.2)
Platelets: 282 10*3/uL (ref 150–400)
RBC: 4.14 MIL/uL (ref 3.87–5.11)
RDW: 13.8 % (ref 11.5–15.5)
WBC: 8.4 10*3/uL (ref 4.0–10.5)

## 2018-08-28 LAB — GLUCOSE, CAPILLARY
Glucose-Capillary: 363 mg/dL — ABNORMAL HIGH (ref 70–99)
Glucose-Capillary: 283 mg/dL — ABNORMAL HIGH (ref 70–99)
Glucose-Capillary: 185 mg/dL — ABNORMAL HIGH (ref 70–99)
Glucose-Capillary: 232 mg/dL — ABNORMAL HIGH (ref 70–99)

## 2018-08-28 LAB — LIPID PANEL
Cholesterol: 168 mg/dL (ref 0–200)
HDL: 46 mg/dL (ref >40)
LDL Cholesterol: 85 mg/dL (ref 0–99)
Total CHOL/HDL Ratio: 3.7 RATIO
Triglycerides: 187 mg/dL — ABNORMAL HIGH (ref <150)
VLDL: 37 mg/dL (ref 0–40)

## 2018-08-28 LAB — ECHOCARDIOGRAM COMPLETE
Height: 66 in
Weight: 3680 oz

## 2018-08-28 LAB — HEPARIN LEVEL (UNFRACTIONATED): Heparin Unfractionated: 0.16 IU/mL — ABNORMAL LOW (ref 0.30–0.70)

## 2018-08-28 LAB — TROPONIN I
TROPONIN I: 17.04 ng/mL — AB (ref <0.03)
Troponin I: 21.69 ng/mL (ref <0.03)
Troponin I: 14.99 ng/mL (ref <0.03)
Troponin I: 13.27 ng/mL (ref <0.03)

## 2018-08-28 LAB — MRSA PCR SCREENING: MRSA by PCR: NEGATIVE

## 2018-08-28 LAB — POCT ACTIVATED CLOTTING TIME: Activated Clotting Time: 307 seconds

## 2018-08-28 LAB — HEMOGLOBIN A1C
Hgb A1c MFr Bld: 13 % — ABNORMAL HIGH (ref 4.8–5.6)
Mean Plasma Glucose: 326.4 mg/dL

## 2018-08-28 LAB — POCT I-STAT CREATININE: Creatinine, Ser: 1.1 mg/dL — ABNORMAL HIGH (ref 0.44–1.00)

## 2018-08-28 MED ORDER — TRAMADOL HCL 50 MG PO TABS
50.0000 mg | ORAL_TABLET | Freq: Three times a day (TID) | ORAL | Status: DC | PRN
  Administered 2018-08-28 – 2018-08-30 (×3): 100 mg via ORAL
  Filled 2018-08-28 (×3): qty 2

## 2018-08-28 MED ORDER — HEPARIN (PORCINE) 25000 UT/250ML-% IV SOLN
1250.0000 [IU]/h | INTRAVENOUS | Status: DC
  Administered 2018-08-28: 950 [IU]/h via INTRAVENOUS
  Filled 2018-08-28 (×3): qty 250

## 2018-08-28 MED ORDER — INSULIN ASPART 100 UNIT/ML ~~LOC~~ SOLN
5.0000 [IU] | Freq: Once | SUBCUTANEOUS | Status: AC
  Administered 2018-08-28: 5 [IU] via SUBCUTANEOUS

## 2018-08-28 MED ORDER — TIROFIBAN HCL IV 12.5 MG/250 ML
0.0750 ug/kg/min | INTRAVENOUS | Status: AC
  Filled 2018-08-28: qty 250

## 2018-08-28 MED ORDER — PANTOPRAZOLE SODIUM 40 MG PO TBEC
40.0000 mg | DELAYED_RELEASE_TABLET | Freq: Every day | ORAL | Status: DC
  Administered 2018-08-28 – 2018-08-30 (×3): 40 mg via ORAL
  Filled 2018-08-28 (×3): qty 1

## 2018-08-28 MED ORDER — ISOSORBIDE MONONITRATE ER 30 MG PO TB24
15.0000 mg | ORAL_TABLET | Freq: Every day | ORAL | Status: DC
  Administered 2018-08-28 – 2018-08-30 (×3): 15 mg via ORAL
  Filled 2018-08-28 (×3): qty 1

## 2018-08-28 MED ORDER — INSULIN ASPART 100 UNIT/ML ~~LOC~~ SOLN
0.0000 [IU] | Freq: Three times a day (TID) | SUBCUTANEOUS | Status: DC
  Administered 2018-08-28: 8 [IU] via SUBCUTANEOUS
  Administered 2018-08-28: 3 [IU] via SUBCUTANEOUS
  Administered 2018-08-29: 15 [IU] via SUBCUTANEOUS

## 2018-08-28 MED ORDER — INSULIN ASPART 100 UNIT/ML ~~LOC~~ SOLN
4.0000 [IU] | Freq: Three times a day (TID) | SUBCUTANEOUS | Status: DC
  Administered 2018-08-28 – 2018-08-30 (×5): 4 [IU] via SUBCUTANEOUS

## 2018-08-28 NOTE — Progress Notes (Signed)
ANTICOAGULATION CONSULT NOTE - Initial Consult  Pharmacy Consult for Heparin  Indication: chest pain/ACS, s/p cath  Allergies  Allergen Reactions  . Adhesive [Tape] Other (See Comments)    Burn Skin  . Codeine Other (See Comments)    paranoid  . Zocor [Simvastatin - High Dose] Other (See Comments)    Muscle spasms  . Penicillins Rash    Has patient had a PCN reaction causing immediate rash, facial/tongue/throat swelling, SOB or lightheadedness with hypotension: Yes Has patient had a PCN reaction causing severe rash involving mucus membranes or skin necrosis: No Has patient had a PCN reaction that required hospitalization does not remember.  Has patient had a PCN reaction occurring within the last 10 years: No If all of the above answers are "NO", then may proceed with Cephalosporin use.    Patient Measurements: Height: 5' 6"  (167.6 cm) Weight: 230 lb (104.3 kg) IBW/kg (Calculated) : 59.3  Vital Signs: Temp: 98.5 F (36.9 C) (02/03 2149) Temp Source: Oral (02/03 2149) BP: 126/91 (02/04 0000) Pulse Rate: 79 (02/04 0000)  Labs: Recent Labs    08/27/18 2152  HGB 12.3  HCT 39.0  PLT 302  APTT 24  LABPROT 13.5  INR 1.04  CREATININE 1.33*  TROPONINI <0.03    Estimated Creatinine Clearance: 50.8 mL/min (A) (by C-G formula based on SCr of 1.33 mg/dL (H)).   Medical History: Past Medical History:  Diagnosis Date  . Asthma   . Degenerative joint disease   . Diabetes mellitus   . Dyspnea   . GERD (gastroesophageal reflux disease)   . Hyperlipidemia    no longer on medication for this  . Hypertension    patient denies  . Neuropathy    bilateral hands and feet  . Sickle cell trait ([place])   . Sleep apnea   . stomach ca dx'd 2010   chemo/xrt comp 05/2009  . TIA (transient ischemic attack) 07/2015    Assessment: [name] now s/p cath, consulted to resume heparin drip 8 hours after sheath removal.   Sheath removed 2/3 at 2307  Goal of Therapy:  Heparin level  0.3-0.7 units/ml Monitor platelets by anticoagulation protocol: Yes   Plan:  Start heparin drip at 950 units/hr at 0700 (8 hours s/p sheath pull) 1500 HL Daily CBC/HL Monitor for bleeding Also running Tirofiban for 6 hours post-cath or until current bag runs out  [name] 08/28/2018,12:35 AM

## 2018-08-28 NOTE — Progress Notes (Signed)
[place] for Heparin  Indication: chest pain/ACS, s/p cath  Allergies  Allergen Reactions  . Adhesive [Tape] Other (See Comments)    Burn Skin  . Codeine Other (See Comments)    paranoid  . Zocor [Simvastatin - High Dose] Other (See Comments)    Muscle spasms  . Penicillins Rash    Has patient had a PCN reaction causing immediate rash, facial/tongue/throat swelling, SOB or lightheadedness with hypotension: Yes Has patient had a PCN reaction causing severe rash involving mucus membranes or skin necrosis: No Has patient had a PCN reaction that required hospitalization does not remember.  Has patient had a PCN reaction occurring within the last 10 years: No If all of the above answers are "NO", then may proceed with Cephalosporin use.    Patient Measurements: Height: 5' 6"  (167.6 cm) Weight: 230 lb (104.3 kg) IBW/kg (Calculated) : 59.3  Vital Signs: Temp: 98 F (36.7 C) (02/04 1131) Temp Source: Oral (02/04 1131) BP: 93/80 (02/04 1400) Pulse Rate: 68 (02/04 1400)  Labs: Recent Labs    08/27/18 2152 08/28/18 0303 08/28/18 0925 08/28/18 1415  HGB 12.3  --  11.4*  --   HCT 39.0  --  36.1  --   PLT 302  --  282  --   APTT 24  --   --   --   LABPROT 13.5  --   --   --   INR 1.04  --   --   --   HEPARINUNFRC  --   --   --  0.16*  CREATININE 1.33*  --  1.15*  --   TROPONINI <0.03 21.69* 17.04*  --     Estimated Creatinine Clearance: 58.7 mL/min (A) (by C-G formula based on SCr of 1.15 mg/dL (H)).   Medical History: Past Medical History:  Diagnosis Date  . Asthma   . Degenerative joint disease   . Diabetes mellitus   . Dyspnea   . GERD (gastroesophageal reflux disease)   . Hyperlipidemia    no longer on medication for this  . Hypertension    patient denies  . Myocardial infarction ([place]) 08/2018  . Neuropathy    bilateral hands and feet  . Sickle cell trait ([employer])   . Sleep apnea   . stomach ca dx'd 2010   chemo/xrt comp  05/2009  . TIA (transient ischemic attack) 07/2015    Assessment: [name] now s/p cath, resumed heparin drip 8 hours after sheath removal. Sheath removed 2/3 at 2307. Patient was also receiving tirofiban for 6 hours post-cath (or until bag ran out). Scheduled for second cath tomorrow, 2/5.  Heparin level was subtherapeutic at 0.16 on heparin 950 units/hr. No infusion issues noted per RN.   CBC relatively stable from yesterday, no bleeding noted.  Goal of Therapy:  Heparin level 0.3-0.7 units/ml Monitor platelets by anticoagulation protocol: Yes   Plan:  Increase heparin drip to 1100 units/hr Check heparin level at 2300 Daily CBC and heparin level Monitor for bleeding  Vertis Kelch, PharmD PGY1 Pharmacy Resident Phone [phone] 08/28/2018       3:11 PM

## 2018-08-28 NOTE — Telephone Encounter (Signed)
Patient knows provider is out of town.

## 2018-08-28 NOTE — Telephone Encounter (Signed)
Received voice mail call from daughter stating,"just wanted Dr. Marin Olp to know that mom had a heart attack last night. They put a stent in her heart. She is getting another stent placed tomorrow."

## 2018-08-28 NOTE — Progress Notes (Signed)
Patient's blood RBS was 32m/dl and her troponin was 21.69. I called Dr WElson Areas(cardiology fellow) and he verbally told me to cover her with 5u of insulin lispro.

## 2018-08-28 NOTE — Care Management (Signed)
Brilinta check sent and pending.  Midge Minium RN, BSN, NCM-BC, ACM-RN [phone]

## 2018-08-28 NOTE — Telephone Encounter (Signed)
Copied from [place] [phone]. Topic: General - Other >> Aug 28, 2018 11:44 AM Rutherford Nail, NT wrote: Reason for CRM: Patient calling and wanted to let Dr Etter Sjogren know that she was admitted to the hospital. States that she had a heart attack yesterday (08/27/2018).

## 2018-08-28 NOTE — Progress Notes (Signed)
  Echocardiogram 2D Echocardiogram has been performed.  [name] 08/28/2018, 10:27 AM

## 2018-08-28 NOTE — Progress Notes (Signed)
Progress Note  Patient Name: [name] Date of Encounter: 08/28/2018  Primary Cardiologist: new; Dr. Ellyn Hack  Subjective   Has intermittent sharp pain; prior chest tightness resolved  Inpatient Medications    Scheduled Meds: . aspirin  324 mg Oral Once  . aspirin EC  81 mg Oral QHS  . fluticasone  2 spray Each Nare Daily  . gabapentin  800 mg Oral TID  . insulin glargine  20 Units Subcutaneous Daily  . metoprolol tartrate  25 mg Oral BID  . rosuvastatin  40 mg Oral q1800  . sertraline  50 mg Oral Daily  . sodium chloride flush  3 mL Intravenous Q12H  . ticagrelor  90 mg Oral BID   Continuous Infusions: . sodium chloride 100 mL/hr (08/27/18 2154)  . sodium chloride    . heparin 950 Units/hr (08/28/18 0800)   PRN Meds: sodium chloride, acetaminophen, albuterol, ipratropium-albuterol, morphine injection, ondansetron (ZOFRAN) IV, sodium chloride flush   Vital Signs    Vitals:   08/28/18 0500 08/28/18 0600 08/28/18 0800 08/28/18 0818  BP: 111/67 125/82 119/75   Pulse: 72 67 77   Resp: (!) 23 18 (!) 21   Temp:    98.6 F (37 C)  TempSrc:    Oral  SpO2: 97% 97% 97%   Weight:      Height:        Intake/Output Summary (Last 24 hours) at 08/28/2018 0858 Last data filed at 08/28/2018 0800 Gross per 24 hour  Intake 325.78 ml  Output 600 ml  Net -274.22 ml    I/O since admission: -[place]   08/27/18 2148  Weight: 104.3 kg    Telemetry    Sinus in 60s- Personally Reviewed  ECG   08/28/2018 ECG (independently read by me): NSR at 65; Q 3, aVF and evolved inferior T wave inversion   08/27/2018 ECG (independently read by me): NSR at 79 with 1 mm STE inferiorly  Physical Exam   BP 119/75 (BP Location: Left Arm)   Pulse 77   Temp 98.6 F (37 C) (Oral)   Resp (!) 21   Ht 5' 6"  (1.676 m)   Wt 104.3 kg   SpO2 97%   BMI 37.12 kg/m  General: Alert, oriented, no distress.  obese Skin: normal turgor, no rashes, warm and dry HEENT: Normocephalic,  atraumatic. Pupils equal round and reactive to light; sclera anicteric; extraocular muscles intact;  Nose without nasal septal hypertrophy Mouth/Parynx benign; Mallinpatti scale 3 Neck: No JVD, no carotid bruits; normal carotid upstroke Lungs: clear to ausculatation and percussion; no wheezing or rales Chest wall:left costochondral tenderness Heart: PMI not displaced, RRR, s1 s2 normal, 1/6 systolic murmur, no diastolic murmur, no rubs, gallops, thrills, or heaves Abdomen: soft, nontender; no hepatosplenomehaly, BS+; abdominal aorta nontender and not dilated by palpation. Back: no CVA tenderness Pulses 2+; R radial site stable Musculoskeletal: full range of motion, normal strength, no joint deformities Extremities: no clubbing cyanosis or edema, Homan's sign negative  Neurologic: grossly nonfocal; Cranial nerves grossly wnl Psychologic: Normal mood and affect   Labs    Chemistry Recent Labs  Lab 08/27/18 2152  NA 138  K 4.7  CL 106  CO2 19*  GLUCOSE 460*  BUN 18  CREATININE 1.33*  CALCIUM 9.0  PROT 7.0  ALBUMIN 3.5  AST 19  ALT 16  ALKPHOS 104  BILITOT 0.6  GFRNONAA 42*  GFRAA 48*  ANIONGAP 13     Hematology Recent Labs  Lab 08/27/18 2152  WBC 9.4  RBC 4.44  HGB 12.3  HCT 39.0  MCV 87.8  MCH 27.7  MCHC 31.5  RDW 13.6  PLT 302    Cardiac Enzymes Recent Labs  Lab 08/27/18 2152 08/28/18 0303  TROPONINI <0.03 21.69*   No results for input(s): TROPIPOC in the last 168 hours.   BNPNo results for input(s): BNP, PROBNP in the last 168 hours.   DDimer No results for input(s): DDIMER in the last 168 hours.   Lipid Panel     Component Value Date/Time   CHOL 168 08/28/2018 0303   CHOL 196 08/02/2016 1317   TRIG 187 (H) 08/28/2018 0303   TRIG 169 (H) 08/02/2016 1317   HDL 46 08/28/2018 0303   HDL 60 08/02/2016 1317   CHOLHDL 3.7 08/28/2018 0303   VLDL 37 08/28/2018 0303   LDLCALC 85 08/28/2018 0303   LDLCALC 102 (H) 08/02/2016 1317   LDLDIRECT 136.0  08/20/2018 1142     Radiology    No results found.  Cardiac Studies    The left ventricular systolic function is normal.  LV end diastolic pressure is normal.  The left ventricular ejection fraction is greater than 65% by visual estimate.  There is trivial (1+) mitral regurgitation.  There is no aortic valve stenosis.  Prox RCA to Mid RCA lesion is 100% stenosed.  Post intervention, there is a 0% residual stenosis.  A drug-eluting stent was successfully placed using a [place] [id number].  Prox RCA lesion is 20% stenosed.  Prox Cx lesion is 90% stenosed.   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  Normal LVEDP.  RECOMMENDATIONS  Admit overnight into the CVICU --run Aggrastat for 6 hours or until bottle complete, then start IV heparin 8 hours after TR band removal for existing circumflex CAD.  With reactive airway disease/COPD medications listed, will start low-dose metoprolol tartrate (titrate as possible).  Anticipate starting ACE inhibitor or ARB following stage PCI.  Plan staged PCI of Cx-OM on February 5  Increase rosuvastatin dose to 40 mg, check fasting lipid panel.  Close diabetes control (check A1c)  Anticipate discharge home 1 day post staged PCI.    Intervention      Patient Profile      [name] is a 68 y.o. female with history of diabetes, hypertension, obesity and hyperlipidemia who presents with ongoing substernal chest pain that began roughly 8:30 PM.  Assessment & Plan    1. Day 1 s/p STEMI with DES stent to RCA.  Images personally reviewed;  ECG with evolutionary inferior T wave inversion today; Troponin increased to 21.69 Continue heparin with 90% LCX-OM stenosis and plan staged PCI tomorrow.  Pt is scheduled for echo today  2. HTN;  Started on  metoprolol yesterday;  BP now 105-115;  With CAD will add low dose isosorbide  3. Poorly controlled DM with HbA1c > 14 as outpatient;  Will need med adjustment; add sliding scale and lantus; hold metformin with cath  4. HLD: LDL 85;  Increase rosuvastatin to 40 mg.  5. Musculoskeletal chest wall pain with left.costochondral tenderness  Signed, Troy Sine, MD, [facility] 08/28/2018, 8:58 AM

## 2018-08-28 NOTE — Progress Notes (Signed)
[id number] Did not walk pt as she stated she was having intermittent CP and going for staged PCI tomorrow. MI education started with pt who voiced understanding. Stressed importance of brilinta with stent. Reviewed NTG use, MI restrictions, counting carbs and heart healthy food choices, and CRP 2. Referred to [facility] program. Pt stated she will not be able to attend as she has no transportation. She stated she has gym in her apt complex she can use. Likes the stationary bike. [name] RN BSN 08/28/2018 2:28 PM

## 2018-08-28 NOTE — Progress Notes (Signed)
Patient Refused blood draw for troponin trending. Explained the problems with that and she accepted the consequences. Cardiology fellow on call was notified and suggested to do finger stick to check blood sugar and  Do morning labs.

## 2018-08-28 NOTE — Care Management Note (Signed)
Case Management Note  Patient Details  Name: MARLON VONRUDEN MRN: [id number] Date of Birth: May 18, 1951  Subjective/Objective:  68 yo female presented with CP, inferior STEMI; s/p cath 2/3 with stent placed; 2/5 planned staged PCI of Cx-OM.                Action/Plan: CM met with patient to discuss transitional needs. Patient states living at home, independent with ADLs with no AD in use. PCP: Dr. Lyndal Pulley; [facility]. Brilinta benefits check is complete with monthly est cost $47, with amount discussed with patient. Patient verbalized concerns of Brilinta copay cost, with patient encouraged to discuss with her Cardiologist. CM discussed [place] for patients Rx at discharge, with patient agreeable. CM team will continue to follow.   Expected Discharge Date:                  Expected Discharge Plan:  Home/Self Care  In-House Referral:  NA  Discharge planning Services  CM Consult, Medication Assistance(Brilinta benefits check)  Post Acute Care Choice:  NA Choice offered to:  NA  DME Arranged:  N/A DME Agency:  NA  HH Arranged:  NA HH Agency:  NA  Status of Service:  In process, will continue to follow  If discussed at Long Length of Stay Meetings, dates discussed:    Additional Comments:  Midge Minium RN, BSN, NCM-BC, ACM-RN [phone] 08/28/2018, 2:22 PM

## 2018-08-28 NOTE — Care Management (Signed)
#    3.   S/W  DEJAWN  @  [facility]  RX # [phone]   TICAGRELOR  : NONE  FORMULARY  BRILINTA  90 MG BID COVER- YES CO-PAY- $ 47.00 TIER- 3 DRUG PRIOR APPROVAL- NO  PREFERRED PHARMACY :  YES [facility] 90 DAY SUPPLY FOR M/O  $ 131.00

## 2018-08-29 ENCOUNTER — Encounter (HOSPITAL_COMMUNITY)
Admission: EM | Disposition: A | Discharge status: Home / Self Care | Payer: Self-pay | Source: Home / Self Care | Attending: Cardiology

## 2018-08-29 DIAGNOSIS — I2510 Atherosclerotic heart disease of native coronary artery without angina pectoris: Secondary | ICD-10-CM

## 2018-08-29 HISTORY — PX: CORONARY STENT INTERVENTION: CATH118234

## 2018-08-29 LAB — GLUCOSE, CAPILLARY
GLUCOSE-CAPILLARY: 100 mg/dL — AB (ref 70–99)
GLUCOSE-CAPILLARY: 361 mg/dL — AB (ref 70–99)
Glucose-Capillary: 137 mg/dL — ABNORMAL HIGH (ref 70–99)
Glucose-Capillary: 100 mg/dL — ABNORMAL HIGH (ref 70–99)
Glucose-Capillary: 345 mg/dL — ABNORMAL HIGH (ref 70–99)

## 2018-08-29 LAB — CBC
HCT: 36.6 % (ref 36.0–46.0)
Hemoglobin: 11.6 g/dL — ABNORMAL LOW (ref 12.0–15.0)
MCH: 27.6 pg (ref 26.0–34.0)
MCHC: 31.7 g/dL (ref 30.0–36.0)
MCV: 87.1 fL (ref 80.0–100.0)
Platelets: 290 10*3/uL (ref 150–400)
RBC: 4.2 MIL/uL (ref 3.87–5.11)
RDW: 13.7 % (ref 11.5–15.5)
WBC: 8.7 10*3/uL (ref 4.0–10.5)
nRBC: 0 % (ref 0.0–0.2)

## 2018-08-29 LAB — HEPARIN LEVEL (UNFRACTIONATED)
Heparin Unfractionated: 0.28 IU/mL — ABNORMAL LOW (ref 0.30–0.70)
Heparin Unfractionated: 0.57 IU/mL (ref 0.30–0.70)

## 2018-08-29 LAB — BASIC METABOLIC PANEL
Anion gap: 10 (ref 5–15)
BUN: 13 mg/dL (ref 8–23)
CO2: 24 mmol/L (ref 22–32)
Calcium: 9.1 mg/dL (ref 8.9–10.3)
Chloride: 109 mmol/L (ref 98–111)
Creatinine, Ser: 1.19 mg/dL — ABNORMAL HIGH (ref 0.44–1.00)
GFR calc Af Amer: 55 mL/min — ABNORMAL LOW (ref >60)
GFR calc non Af Amer: 48 mL/min — ABNORMAL LOW (ref >60)
Glucose, Bld: 118 mg/dL — ABNORMAL HIGH (ref 70–99)
Potassium: 4.6 mmol/L (ref 3.5–5.1)
Sodium: 143 mmol/L (ref 135–145)

## 2018-08-29 LAB — POCT ACTIVATED CLOTTING TIME
Activated Clotting Time: 268 seconds
Activated Clotting Time: 670 seconds

## 2018-08-29 SURGERY — CORONARY STENT INTERVENTION
Anesthesia: LOCAL

## 2018-08-29 MED ORDER — MIDAZOLAM HCL 2 MG/2ML IJ SOLN
INTRAMUSCULAR | Status: AC
  Filled 2018-08-29: qty 2

## 2018-08-29 MED ORDER — FENTANYL CITRATE (PF) 100 MCG/2ML IJ SOLN
INTRAMUSCULAR | Status: AC
  Filled 2018-08-29: qty 2

## 2018-08-29 MED ORDER — SODIUM CHLORIDE 0.9 % IV SOLN
INTRAVENOUS | Status: DC
  Administered 2018-08-29: 125 mL/h via INTRAVENOUS

## 2018-08-29 MED ORDER — LIDOCAINE HCL (PF) 1 % IJ SOLN
INTRAMUSCULAR | Status: AC
  Filled 2018-08-29: qty 30

## 2018-08-29 MED ORDER — INSULIN ASPART 100 UNIT/ML ~~LOC~~ SOLN
0.0000 [IU] | Freq: Three times a day (TID) | SUBCUTANEOUS | Status: DC
  Administered 2018-08-29: 11 [IU] via SUBCUTANEOUS
  Administered 2018-08-30: 06:00:00 3 [IU] via SUBCUTANEOUS

## 2018-08-29 MED ORDER — ANGIOPLASTY BOOK
Freq: Once | Status: AC
  Administered 2018-08-30: 06:00:00
  Filled 2018-08-29: qty 1

## 2018-08-29 MED ORDER — ASPIRIN 81 MG PO CHEW: 81.0000 mg | CHEWABLE_TABLET | ORAL | Status: DC

## 2018-08-29 MED ORDER — HEPARIN (PORCINE) IN NACL 1000-0.9 UT/500ML-% IV SOLN
INTRAVENOUS | Status: AC
  Filled 2018-08-29: qty 1000

## 2018-08-29 MED ORDER — VERAPAMIL HCL 2.5 MG/ML IV SOLN
INTRAVENOUS | Status: AC
  Filled 2018-08-29: qty 2

## 2018-08-29 MED ORDER — LIDOCAINE HCL (PF) 1 % IJ SOLN
INTRAMUSCULAR | Status: DC | PRN
  Administered 2018-08-29: 2 mL

## 2018-08-29 MED ORDER — HEPARIN (PORCINE) IN NACL 1000-0.9 UT/500ML-% IV SOLN
INTRAVENOUS | Status: DC | PRN
  Administered 2018-08-29 (×2): 500 mL

## 2018-08-29 MED ORDER — HEART ATTACK BOUNCING BOOK
Freq: Once | Status: AC
  Administered 2018-08-30: 06:00:00
  Filled 2018-08-29: qty 1

## 2018-08-29 MED ORDER — SODIUM CHLORIDE 0.9% FLUSH: 3.0000 mL | INTRAVENOUS | Status: DC | PRN

## 2018-08-29 MED ORDER — VERAPAMIL HCL 2.5 MG/ML IV SOLN
INTRAVENOUS | Status: DC | PRN
  Administered 2018-08-29: 10 mL via INTRA_ARTERIAL

## 2018-08-29 MED ORDER — TICAGRELOR 90 MG PO TABS: 90.0000 mg | ORAL_TABLET | Freq: Two times a day (BID) | ORAL | Status: DC

## 2018-08-29 MED ORDER — FENTANYL CITRATE (PF) 100 MCG/2ML IJ SOLN
INTRAMUSCULAR | Status: DC | PRN
  Administered 2018-08-29 (×2): 50 ug via INTRAVENOUS

## 2018-08-29 MED ORDER — SODIUM CHLORIDE 0.9 % IV SOLN: 250.0000 mL | INTRAVENOUS | Status: DC | PRN

## 2018-08-29 MED ORDER — ASPIRIN 81 MG PO CHEW
81.0000 mg | CHEWABLE_TABLET | ORAL | Status: AC
  Administered 2018-08-29: 81 mg via ORAL
  Filled 2018-08-29: qty 1

## 2018-08-29 MED ORDER — MIDAZOLAM HCL 2 MG/2ML IJ SOLN
INTRAMUSCULAR | Status: DC | PRN
  Administered 2018-08-29: 2 mg via INTRAVENOUS

## 2018-08-29 MED ORDER — SODIUM CHLORIDE 0.9% FLUSH
3.0000 mL | Freq: Two times a day (BID) | INTRAVENOUS | Status: DC
  Administered 2018-08-29: 22:00:00 3 mL via INTRAVENOUS

## 2018-08-29 MED ORDER — IOHEXOL 350 MG/ML SOLN
INTRAVENOUS | Status: DC | PRN
  Administered 2018-08-29: 70 mL via INTRAVENOUS

## 2018-08-29 MED ORDER — HEPARIN SODIUM (PORCINE) 1000 UNIT/ML IJ SOLN
INTRAMUSCULAR | Status: DC | PRN
  Administered 2018-08-29: 11000 [IU] via INTRAVENOUS

## 2018-08-29 MED ORDER — SODIUM CHLORIDE 0.9 % IV SOLN
INTRAVENOUS | Status: DC
  Administered 2018-08-29: 18:00:00 via INTRAVENOUS

## 2018-08-29 MED ORDER — HEPARIN SODIUM (PORCINE) 1000 UNIT/ML IJ SOLN
INTRAMUSCULAR | Status: AC
  Filled 2018-08-29: qty 1

## 2018-08-29 SURGICAL SUPPLY — 13 items
BALLN EMERGE MR 2.25X12 (BALLOONS) ×2
BALLN SAPPHIRE ~~LOC~~ 2.75X15 (BALLOONS) ×1 IMPLANT
BALLOON EMERGE MR 2.25X12 (BALLOONS) IMPLANT
CATH LAUNCHER 6FR EBU3.5 (CATHETERS) ×1 IMPLANT
DEVICE RAD COMP TR BAND LRG (VASCULAR PRODUCTS) ×1 IMPLANT
GLIDESHEATH SLEND SS 6F .021 (SHEATH) ×1 IMPLANT
KIT ENCORE 26 ADVANTAGE (KITS) ×1 IMPLANT
KIT HEART LEFT (KITS) ×2 IMPLANT
PACK CARDIAC CATHETERIZATION (CUSTOM PROCEDURE TRAY) ×2 IMPLANT
STENT RESOLUTE ONYX 2.5X18 (Permanent Stent) ×1 IMPLANT
TRANSDUCER W/STOPCOCK (MISCELLANEOUS) ×2 IMPLANT
TUBING CIL FLEX 10 FLL-RA (TUBING) ×2 IMPLANT
WIRE RUNTHROUGH .014X180CM (WIRE) ×1 IMPLANT

## 2018-08-29 NOTE — Progress Notes (Signed)
[place] for Heparin  Indication: chest pain/ACS, s/p cath  Allergies  Allergen Reactions  . Adhesive [Tape] Other (See Comments)    Burn Skin  . Codeine Other (See Comments)    paranoid  . Zocor [Simvastatin - High Dose] Other (See Comments)    Muscle spasms  . Penicillins Rash    Has patient had a PCN reaction causing immediate rash, facial/tongue/throat swelling, SOB or lightheadedness with hypotension: Yes Has patient had a PCN reaction causing severe rash involving mucus membranes or skin necrosis: No Has patient had a PCN reaction that required hospitalization does not remember.  Has patient had a PCN reaction occurring within the last 10 years: No If all of the above answers are "NO", then may proceed with Cephalosporin use.    Patient Measurements: Height: 5' 6"  (167.6 cm) Weight: 230 lb (104.3 kg) IBW/kg (Calculated) : 59.3  Heparin dosing wt: 85 kg  Vital Signs: Temp: 98.6 F (37 C) (02/05 0000) Temp Source: Oral (02/05 0000) BP: 102/65 (02/05 0000) Pulse Rate: 72 (02/05 0000)  Labs: Recent Labs    08/27/18 2152 08/27/18 2220  08/28/18 0925 08/28/18 1415 08/28/18 2115 08/28/18 2345  HGB 12.3  --   --  11.4*  --   --   --   HCT 39.0  --   --  36.1  --   --   --   PLT 302  --   --  282  --   --   --   APTT 24  --   --   --   --   --   --   LABPROT 13.5  --   --   --   --   --   --   INR 1.04  --   --   --   --   --   --   HEPARINUNFRC  --   --   --   --  0.16*  --  0.28*  CREATININE 1.33* 1.10*  --  1.15*  --   --   --   TROPONINI <0.03  --    < > 17.04* 14.99* 13.27*  --    < > = values in this interval not displayed.    Estimated Creatinine Clearance: 58.7 mL/min (A) (by C-G formula based on SCr of 1.15 mg/dL (H)).   Medical History: Past Medical History:  Diagnosis Date  . Asthma   . Degenerative joint disease   . Diabetes mellitus   . Dyspnea   . GERD (gastroesophageal reflux disease)   .  Hyperlipidemia    no longer on medication for this  . Hypertension    patient denies  . Myocardial infarction ([place]) 08/2018  . Neuropathy    bilateral hands and feet  . Sickle cell trait ([place])   . Sleep apnea   . stomach ca dx'd 2010   chemo/xrt comp 05/2009  . TIA (transient ischemic attack) 07/2015    Assessment: [name] now s/p cath, resumed heparin drip 8 hours after sheath removal. Scheduled for second cath 2/5.   Heparin level remains slightly subtherapeutic at 0.28 on heparin 1100 units/hr. No infusion issues noted per RN.   Goal of Therapy:  Heparin level 0.3-0.7 units/ml Monitor platelets by anticoagulation protocol: Yes   Plan:  Increase heparin drip to 1250 units/hr F/u 6 hr heparin level Daily CBC and heparin level  Sherlon Handing, PharmD, BCPS Clinical pharmacist  **Pharmacist phone directory can  now be found on [email or web address] ([email or web address]).  Listed under [place]. 08/29/2018       1:40 AM

## 2018-08-29 NOTE — Progress Notes (Signed)
Progress Note  Patient Name: [name] Date of Encounter: 08/29/2018  Primary Cardiologist: new; Dr. Ellyn Hack  Subjective   No recurrent ischemic chest pain;  Inpatient Medications    Scheduled Meds: . aspirin  324 mg Oral Once  . aspirin EC  81 mg Oral QHS  . fluticasone  2 spray Each Nare Daily  . gabapentin  800 mg Oral TID  . insulin aspart  0-15 Units Subcutaneous TID WC  . insulin aspart  4 Units Subcutaneous TID WC  . insulin glargine  20 Units Subcutaneous Daily  . isosorbide mononitrate  15 mg Oral Daily  . metoprolol tartrate  25 mg Oral BID  . pantoprazole  40 mg Oral Daily  . rosuvastatin  40 mg Oral q1800  . sertraline  50 mg Oral Daily  . sodium chloride flush  3 mL Intravenous Q12H  . ticagrelor  90 mg Oral BID   Continuous Infusions: . sodium chloride 100 mL/hr (08/27/18 2154)  . sodium chloride    . heparin 1,250 Units/hr (08/29/18 0800)   PRN Meds: sodium chloride, acetaminophen, albuterol, ipratropium-albuterol, morphine injection, ondansetron (ZOFRAN) IV, sodium chloride flush, traMADol   Vital Signs    Vitals:   08/29/18 0400 08/29/18 0600 08/29/18 0700 08/29/18 0805  BP: (!) 80/51 104/89  119/87  Pulse: 76 74  73  Resp: (!) 25 20  15   Temp: 98.1 F (36.7 C)  98.3 F (36.8 C)   TempSrc: Oral  Oral   SpO2: 95% 98%  97%  Weight:      Height:        Intake/Output Summary (Last 24 hours) at 08/29/2018 1034 Last data filed at 08/29/2018 0815 Gross per 24 hour  Intake 638.63 ml  Output 2 ml  Net 636.63 ml    I/O since admission: +230  Filed Weights   08/27/18 2148  Weight: 104.3 kg    Telemetry    Sinus  67- Personally Reviewed  ECG   08/28/2018 ECG (independently read by me): NSR at 65; Q 3, aVF and evolved inferior T wave inversion   08/27/2018 ECG (independently read by me): NSR at 79 with 1 mm STE inferiorly  Physical Exam   General: Alert, oriented, no distress.  Skin: normal turgor, no rashes, warm and dry HEENT:  Normocephalic, atraumatic. Pupils equal round and reactive to light; sclera anicteric; extraocular muscles intact;  Nose without nasal septal hypertrophy Mouth/Parynx benign; Mallinpatti scale 3 Neck: No JVD, no carotid bruits; normal carotid upstroke Lungs: clear to ausculatation and percussion; no wheezing or rales Chest wall: Mild costochondral tenderness Heart: PMI not displaced, RRR, s1 s2 normal, 1/6 systolic murmur, no diastolic murmur, no rubs, gallops, thrills, or heaves Abdomen: soft, nontender; no hepatosplenomehaly, BS+; abdominal aorta nontender and not dilated by palpation. Back: no CVA tenderness Pulses 2+ R radial site stable Musculoskeletal: full range of motion, normal strength, no joint deformities Extremities: no clubbing cyanosis or edema, Homan's sign negative  Neurologic: grossly nonfocal; Cranial nerves grossly wnl Psychologic: Normal mood and affect   Labs    Chemistry Recent Labs  Lab 08/27/18 2152 08/27/18 2220 08/28/18 0925  NA 138  --  138  K 4.7  --  4.6  CL 106  --  106  CO2 19*  --  23  GLUCOSE 460*  --  297*  BUN 18  --  15  CREATININE 1.33* 1.10* 1.15*  CALCIUM 9.0  --  8.9  PROT 7.0  --   --  ALBUMIN 3.5  --   --   AST 19  --   --   ALT 16  --   --   ALKPHOS 104  --   --   BILITOT 0.6  --   --   GFRNONAA 42*  --  50*  GFRAA 48*  --  57*  ANIONGAP 13  --  9     Hematology Recent Labs  Lab 08/27/18 2152 08/28/18 0925 08/29/18 0829  WBC 9.4 8.4 8.7  RBC 4.44 4.14 4.20  HGB 12.3 11.4* 11.6*  HCT 39.0 36.1 36.6  MCV 87.8 87.2 87.1  MCH 27.7 27.5 27.6  MCHC 31.5 31.6 31.7  RDW 13.6 13.8 13.7  PLT 302 282 290    Cardiac Enzymes Recent Labs  Lab 08/28/18 0303 08/28/18 0925 08/28/18 1415 08/28/18 2115  TROPONINI 21.69* 17.04* 14.99* 13.27*   No results for input(s): TROPIPOC in the last 168 hours.   BNPNo results for input(s): BNP, PROBNP in the last 168 hours.   DDimer No results for input(s): DDIMER in the last 168  hours.   Lipid Panel     Component Value Date/Time   CHOL 168 08/28/2018 0303   CHOL 196 08/02/2016 1317   TRIG 187 (H) 08/28/2018 0303   TRIG 169 (H) 08/02/2016 1317   HDL 46 08/28/2018 0303   HDL 60 08/02/2016 1317   CHOLHDL 3.7 08/28/2018 0303   VLDL 37 08/28/2018 0303   LDLCALC 85 08/28/2018 0303   LDLCALC 102 (H) 08/02/2016 1317   LDLDIRECT 136.0 08/20/2018 1142     Radiology    No results found.  Cardiac Studies    The left ventricular systolic function is normal.  LV end diastolic pressure is normal.  The left ventricular ejection fraction is greater than 65% by visual estimate.  There is trivial (1+) mitral regurgitation.  There is no aortic valve stenosis.  Prox RCA to Mid RCA lesion is 100% stenosed.  Post intervention, there is a 0% residual stenosis.  A drug-eluting stent was successfully placed using a [place] [id number].  Prox RCA lesion is 20% stenosed.  Prox Cx lesion is 90% stenosed.   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  Normal LVEDP.  RECOMMENDATIONS  Admit overnight into the CVICU --run Aggrastat for 6 hours or until bottle complete, then start IV heparin 8 hours after TR band removal for existing circumflex CAD.  With reactive airway disease/COPD medications listed, will start low-dose metoprolol tartrate (titrate as possible).  Anticipate starting ACE inhibitor or ARB following stage PCI.  Plan staged PCI of Cx-OM on February 5  Increase rosuvastatin dose to 40 mg, check fasting lipid panel.  Close diabetes control (check A1c)  Anticipate discharge home 1 day post staged PCI.    Intervention      Patient Profile      [name] is a 68 y.o. female with history of diabetes, hypertension, obesity and hyperlipidemia who presents with  ongoing substernal chest pain that began roughly 8:30 PM.  Assessment & Plan    1. Day 2 s/p STEMI with DES stent to RCA.  Images personally reviewed;  ECG with evolutionary inferior T wave inversion t; Troponin increased to 21.69 Pt has been on heparin with concomitant 90% LCX-OM . Plan stage PCI of OM today.  Currently NPO. Pt aware of risks/benefits.  2. HTN;  Started on metoprolol 25 mg bid ;  BP 107/70  With CAD low dose isosorbide added yesterday; probably can dc nitrates post cath if stable and consider ARB with DM.  3. Poorly controlled DM with HbA1c > 14 as outpatient;  Will need med adjustment; add sliding scale and lantus; hold metformin with cath.  Will need to be more aggressive with DM management post cath.   4. HLD: LDL 85; rosuvastatin increaseto 40 mg.  5. Musculoskeletal chest wall pain with left.costochondral tenderness  Will repeat Bmet today for cath this afternoon.  Signed, Troy Sine, MD, [facility] 08/29/2018, 10:34 AM

## 2018-08-29 NOTE — H&P (View-Only) (Signed)
Progress Note  Patient Name: [name] Date of Encounter: 08/29/2018  Primary Cardiologist: new; Dr. Ellyn Hack  Subjective   No recurrent ischemic chest pain;  Inpatient Medications    Scheduled Meds: . aspirin  324 mg Oral Once  . aspirin EC  81 mg Oral QHS  . fluticasone  2 spray Each Nare Daily  . gabapentin  800 mg Oral TID  . insulin aspart  0-15 Units Subcutaneous TID WC  . insulin aspart  4 Units Subcutaneous TID WC  . insulin glargine  20 Units Subcutaneous Daily  . isosorbide mononitrate  15 mg Oral Daily  . metoprolol tartrate  25 mg Oral BID  . pantoprazole  40 mg Oral Daily  . rosuvastatin  40 mg Oral q1800  . sertraline  50 mg Oral Daily  . sodium chloride flush  3 mL Intravenous Q12H  . ticagrelor  90 mg Oral BID   Continuous Infusions: . sodium chloride 100 mL/hr (08/27/18 2154)  . sodium chloride    . heparin 1,250 Units/hr (08/29/18 0800)   PRN Meds: sodium chloride, acetaminophen, albuterol, ipratropium-albuterol, morphine injection, ondansetron (ZOFRAN) IV, sodium chloride flush, traMADol   Vital Signs    Vitals:   08/29/18 0400 08/29/18 0600 08/29/18 0700 08/29/18 0805  BP: (!) 80/51 104/89  119/87  Pulse: 76 74  73  Resp: (!) 25 20  15   Temp: 98.1 F (36.7 C)  98.3 F (36.8 C)   TempSrc: Oral  Oral   SpO2: 95% 98%  97%  Weight:      Height:        Intake/Output Summary (Last 24 hours) at 08/29/2018 1034 Last data filed at 08/29/2018 0815 Gross per 24 hour  Intake 638.63 ml  Output 2 ml  Net 636.63 ml    I/O since admission: +230  Filed Weights   08/27/18 2148  Weight: 104.3 kg    Telemetry    Sinus  68- Personally Reviewed  ECG   08/28/2018 ECG (independently read by me): NSR at 65; Q 3, aVF and evolved inferior T wave inversion   08/27/2018 ECG (independently read by me): NSR at 79 with 1 mm STE inferiorly  Physical Exam   General: Alert, oriented, no distress.  Skin: normal turgor, no rashes, warm and dry HEENT:  Normocephalic, atraumatic. Pupils equal round and reactive to light; sclera anicteric; extraocular muscles intact;  Nose without nasal septal hypertrophy Mouth/Parynx benign; Mallinpatti scale 3 Neck: No JVD, no carotid bruits; normal carotid upstroke Lungs: clear to ausculatation and percussion; no wheezing or rales Chest wall: Mild costochondral tenderness Heart: PMI not displaced, RRR, s1 s2 normal, 1/6 systolic murmur, no diastolic murmur, no rubs, gallops, thrills, or heaves Abdomen: soft, nontender; no hepatosplenomehaly, BS+; abdominal aorta nontender and not dilated by palpation. Back: no CVA tenderness Pulses 2+ R radial site stable Musculoskeletal: full range of motion, normal strength, no joint deformities Extremities: no clubbing cyanosis or edema, Homan's sign negative  Neurologic: grossly nonfocal; Cranial nerves grossly wnl Psychologic: Normal mood and affect   Labs    Chemistry Recent Labs  Lab 08/27/18 2152 08/27/18 2220 08/28/18 0925  NA 138  --  138  K 4.7  --  4.6  CL 106  --  106  CO2 19*  --  23  GLUCOSE 460*  --  297*  BUN 18  --  15  CREATININE 1.33* 1.10* 1.15*  CALCIUM 9.0  --  8.9  PROT 7.0  --   --  ALBUMIN 3.5  --   --   AST 19  --   --   ALT 16  --   --   ALKPHOS 104  --   --   BILITOT 0.6  --   --   GFRNONAA 42*  --  50*  GFRAA 48*  --  57*  ANIONGAP 13  --  9     Hematology Recent Labs  Lab 08/27/18 2152 08/28/18 0925 08/29/18 0829  WBC 9.4 8.4 8.7  RBC 4.44 4.14 4.20  HGB 12.3 11.4* 11.6*  HCT 39.0 36.1 36.6  MCV 87.8 87.2 87.1  MCH 27.7 27.5 27.6  MCHC 31.5 31.6 31.7  RDW 13.6 13.8 13.7  PLT 302 282 290    Cardiac Enzymes Recent Labs  Lab 08/28/18 0303 08/28/18 0925 08/28/18 1415 08/28/18 2115  TROPONINI 21.69* 17.04* 14.99* 13.27*   No results for input(s): TROPIPOC in the last 168 hours.   BNPNo results for input(s): BNP, PROBNP in the last 168 hours.   DDimer No results for input(s): DDIMER in the last 168  hours.   Lipid Panel     Component Value Date/Time   CHOL 168 08/28/2018 0303   CHOL 196 08/02/2016 1317   TRIG 187 (H) 08/28/2018 0303   TRIG 169 (H) 08/02/2016 1317   HDL 46 08/28/2018 0303   HDL 60 08/02/2016 1317   CHOLHDL 3.7 08/28/2018 0303   VLDL 37 08/28/2018 0303   LDLCALC 85 08/28/2018 0303   LDLCALC 102 (H) 08/02/2016 1317   LDLDIRECT 136.0 08/20/2018 1142     Radiology    No results found.  Cardiac Studies    The left ventricular systolic function is normal.  LV end diastolic pressure is normal.  The left ventricular ejection fraction is greater than 65% by visual estimate.  There is trivial (1+) mitral regurgitation.  There is no aortic valve stenosis.  Prox RCA to Mid RCA lesion is 100% stenosed.  Post intervention, there is a 0% residual stenosis.  A drug-eluting stent was successfully placed using a [place] [id number].  Prox RCA lesion is 20% stenosed.  Prox Cx lesion is 90% stenosed.   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  Normal LVEDP.  RECOMMENDATIONS  Admit overnight into the CVICU --run Aggrastat for 6 hours or until bottle complete, then start IV heparin 8 hours after TR band removal for existing circumflex CAD.  With reactive airway disease/COPD medications listed, will start low-dose metoprolol tartrate (titrate as possible).  Anticipate starting ACE inhibitor or ARB following stage PCI.  Plan staged PCI of Cx-OM on February 5  Increase rosuvastatin dose to 40 mg, check fasting lipid panel.  Close diabetes control (check A1c)  Anticipate discharge home 1 day post staged PCI.    Intervention      Patient Profile      [name] is a 68 y.o. female with history of diabetes, hypertension, obesity and hyperlipidemia who presents with  ongoing substernal chest pain that began roughly 8:30 PM.  Assessment & Plan    1. Day 2 s/p STEMI with DES stent to RCA.  Images personally reviewed;  ECG with evolutionary inferior T wave inversion t; Troponin increased to 21.69 Pt has been on heparin with concomitant 90% LCX-OM . Plan stage PCI of OM today.  Currently NPO. Pt aware of risks/benefits.  2. HTN;  Started on metoprolol 25 mg bid ;  BP 107/70  With CAD low dose isosorbide added yesterday; probably can dc nitrates post cath if stable and consider ARB with DM.  3. Poorly controlled DM with HbA1c > 14 as outpatient;  Will need med adjustment; add sliding scale and lantus; hold metformin with cath.  Will need to be more aggressive with DM management post cath.   4. HLD: LDL 85; rosuvastatin increaseto 40 mg.  5. Musculoskeletal chest wall pain with left.costochondral tenderness  Will repeat Bmet today for cath this afternoon.  Signed, Troy Sine, MD, [facility] 08/29/2018, 10:34 AM

## 2018-08-29 NOTE — Interval H&P Note (Signed)
Cath Lab Visit (complete for each Cath Lab visit)  Clinical Evaluation Leading to the Procedure:   ACS: Yes.    Non-ACS:  n/a   History and Physical Interval Note:  08/29/2018 2:58 PM  [name]  has presented today for surgery, with the diagnosis of cad  The various methods of treatment have been discussed with the patient and family. After consideration of risks, benefits and other options for treatment, the patient has consented to  Procedure(s): CORONARY STENT INTERVENTION (N/A) as a surgical intervention .  The patient's history has been reviewed, patient examined, no change in status, stable for surgery.  I have reviewed the patient's chart and labs.  Questions were answered to the patient's satisfaction.     Kathlyn [place]

## 2018-08-29 NOTE — Progress Notes (Signed)
[facility] [place] for Heparin  Indication: chest pain/ACS, s/p cath  Allergies  Allergen Reactions  . Adhesive [Tape] Other (See Comments)    Burn Skin  . Codeine Other (See Comments)    paranoid  . Zocor [Simvastatin - High Dose] Other (See Comments)    Muscle spasms  . Penicillins Rash    Has patient had a PCN reaction causing immediate rash, facial/tongue/throat swelling, SOB or lightheadedness with hypotension: Yes Has patient had a PCN reaction causing severe rash involving mucus membranes or skin necrosis: No Has patient had a PCN reaction that required hospitalization does not remember.  Has patient had a PCN reaction occurring within the last 10 years: No If all of the above answers are "NO", then may proceed with Cephalosporin use.    Patient Measurements: Height: 5' 6"  (167.6 cm) Weight: 230 lb (104.3 kg) IBW/kg (Calculated) : 59.3  Heparin dosing wt: 85 kg  Vital Signs: Temp: 98.3 F (36.8 C) (02/05 0700) Temp Source: Oral (02/05 0700) BP: 119/87 (02/05 0805) Pulse Rate: 73 (02/05 0805)  Labs: Recent Labs    08/27/18 2152 08/27/18 2220  08/28/18 0925 08/28/18 1415 08/28/18 2115 08/28/18 2345 08/29/18 0829  HGB 12.3  --   --  11.4*  --   --   --  11.6*  HCT 39.0  --   --  36.1  --   --   --  36.6  PLT 302  --   --  282  --   --   --  290  APTT 24  --   --   --   --   --   --   --   LABPROT 13.5  --   --   --   --   --   --   --   INR 1.04  --   --   --   --   --   --   --   HEPARINUNFRC  --   --   --   --  0.16*  --  0.28* 0.57  CREATININE 1.33* 1.10*  --  1.15*  --   --   --   --   TROPONINI <0.03  --    < > 17.04* 14.99* 13.27*  --   --    < > = values in this interval not displayed.    Estimated Creatinine Clearance: 58.7 mL/min (A) (by C-G formula based on SCr of 1.15 mg/dL (H)).   Medical History: Past Medical History:  Diagnosis Date  . Asthma   . Degenerative joint disease   . Diabetes mellitus   . Dyspnea    . GERD (gastroesophageal reflux disease)   . Hyperlipidemia    no longer on medication for this  . Hypertension    patient denies  . Myocardial infarction ([place]) 08/2018  . Neuropathy    bilateral hands and feet  . Sickle cell trait ([place])   . Sleep apnea   . stomach ca dx'd 2010   chemo/xrt comp 05/2009  . TIA (transient ischemic attack) 07/2015    Assessment: [name] now s/p cath, resumed heparin drip 8 hours after sheath removal. Scheduled for second cath 2/5.   Heparin level at goal this AM, CBC stable.  No overt bleeding or complications noted.  Goal of Therapy:  Heparin level 0.3-0.7 units/ml Monitor platelets by anticoagulation protocol: Yes   Plan:  Continue heparin gtt at current rate. Daily heparin level and  CBC. F/u plans for heparin after cath lab.  Marguerite Olea, [facility] Clinical Pharmacist Phone [phone]  08/29/2018 9:35 AM

## 2018-08-30 ENCOUNTER — Encounter (HOSPITAL_COMMUNITY): Payer: Self-pay | Admitting: Cardiovascular Disease

## 2018-08-30 ENCOUNTER — Telehealth: Payer: Self-pay | Admitting: Cardiology

## 2018-08-30 LAB — BASIC METABOLIC PANEL
Anion gap: 10 (ref 5–15)
BUN: 13 mg/dL (ref 8–23)
CHLORIDE: 107 mmol/L (ref 98–111)
CO2: 22 mmol/L (ref 22–32)
Calcium: 8.3 mg/dL — ABNORMAL LOW (ref 8.9–10.3)
Creatinine, Ser: 1.31 mg/dL — ABNORMAL HIGH (ref 0.44–1.00)
GFR calc Af Amer: 49 mL/min — ABNORMAL LOW (ref >60)
GFR calc non Af Amer: 42 mL/min — ABNORMAL LOW (ref >60)
Glucose, Bld: 205 mg/dL — ABNORMAL HIGH (ref 70–99)
Potassium: 4.3 mmol/L (ref 3.5–5.1)
Sodium: 139 mmol/L (ref 135–145)

## 2018-08-30 LAB — CBC
HCT: 32.4 % — ABNORMAL LOW (ref 36.0–46.0)
HEMOGLOBIN: 10.4 g/dL — AB (ref 12.0–15.0)
MCH: 27.8 pg (ref 26.0–34.0)
MCHC: 32.1 g/dL (ref 30.0–36.0)
MCV: 86.6 fL (ref 80.0–100.0)
Platelets: 278 10*3/uL (ref 150–400)
RBC: 3.74 MIL/uL — ABNORMAL LOW (ref 3.87–5.11)
RDW: 13.6 % (ref 11.5–15.5)
WBC: 8.5 10*3/uL (ref 4.0–10.5)
nRBC: 0 % (ref 0.0–0.2)

## 2018-08-30 LAB — GLUCOSE, CAPILLARY
Glucose-Capillary: 157 mg/dL — ABNORMAL HIGH (ref 70–99)
Glucose-Capillary: 250 mg/dL — ABNORMAL HIGH (ref 70–99)

## 2018-08-30 MED ORDER — ROSUVASTATIN CALCIUM 40 MG PO TABS: 40.0000 mg | ORAL_TABLET | Freq: Every day | ORAL | 6 refills | Status: DC

## 2018-08-30 MED ORDER — INSULIN NPH ISOPHANE & REGULAR (70-30) 100 UNIT/ML ~~LOC~~ SUSP: 15.0000 [IU] | Freq: Two times a day (BID) | SUBCUTANEOUS | 3 refills | Status: DC

## 2018-08-30 MED ORDER — NITROGLYCERIN 0.4 MG SL SUBL: 0.4000 mg | SUBLINGUAL_TABLET | SUBLINGUAL | 12 refills | Status: DC | PRN

## 2018-08-30 MED ORDER — INSULIN NPH ISOPHANE & REGULAR (70-30) 100 UNIT/ML ~~LOC~~ SUSP: SUBCUTANEOUS | 3 refills | Status: DC

## 2018-08-30 MED ORDER — METOPROLOL TARTRATE 25 MG PO TABS: 25.0000 mg | ORAL_TABLET | Freq: Two times a day (BID) | ORAL | 6 refills | Status: DC

## 2018-08-30 MED ORDER — ISOSORBIDE MONONITRATE ER 30 MG PO TB24: 15.0000 mg | ORAL_TABLET | Freq: Every day | ORAL | 6 refills | Status: DC

## 2018-08-30 MED ORDER — TICAGRELOR 90 MG PO TABS: 90.0000 mg | ORAL_TABLET | Freq: Two times a day (BID) | ORAL | 11 refills | Status: DC

## 2018-08-30 MED FILL — BRILINTA 90 MG TABLET: 90 | 30 days supply | Qty: 60 | Fill #0 | Status: TO

## 2018-08-30 NOTE — Discharge Summary (Addendum)
Discharge Summary    Patient ID: [name] MRN: [id number]; DOB: May 04, 1951  Admit date: 08/27/2018 Discharge date: 08/30/2018  Primary Care Provider: Ann Held, DO  Primary Cardiologist: New to Dr. Ellyn Hack  Discharge Diagnoses    Principal Problem:   Acute ST elevation myocardial infarction (STEMI) of inferior wall [facility]) Active Problems:   Essential hypertension   History of CVA (cerebrovascular accident)   Type 2 diabetes with nephropathy ([place])   Hyperlipidemia associated with type 2 diabetes mellitus ([place])   Coronary artery disease involving native coronary artery of native heart with unstable angina pectoris ([place])   Allergies Allergies  Allergen Reactions  . Adhesive [Tape] Other (See Comments)    Burn Skin  . Codeine Other (See Comments)    paranoid  . Zocor [Simvastatin - High Dose] Other (See Comments)    Muscle spasms  . Penicillins Rash    Has patient had a PCN reaction causing immediate rash, facial/tongue/throat swelling, SOB or lightheadedness with hypotension: Yes Has patient had a PCN reaction causing severe rash involving mucus membranes or skin necrosis: No Has patient had a PCN reaction that required hospitalization does not remember.  Has patient had a PCN reaction occurring within the last 10 years: No If all of the above answers are "NO", then may proceed with Cephalosporin use.     Diagnostic Studies/Procedures    CORONARY STENT INTERVENTION  08/29/18  Conclusion     Previously placed Prox RCA to Mid RCA drug eluting stent is widely patent.  Balloon angioplasty was performed.  Prox RCA lesion is 20% stenosed.  Prox Cx to Mid Cx lesion is 90% stenosed.  Post intervention, there is a 0% residual stenosis.  A drug-eluting stent was successfully placed using a STENT RESOLUTE ONYX [id number].   Successful angioplasty and drug-eluting stent placement to the mid left circumflex via the right radial artery.  Recommendations: Continue  dual antiplatelet therapy for at least 1 year. Possible discharge home tomorrow.   Echo 08/28/18 1. The left ventricle has low normal systolic function of 67-34%. The cavity size is normal. There is severely increased left ventricular wall thickness of the septal wall. Normal left ventricular filling pressures. The left ventricular diastology could  not be evaluated due to indeterminent diastolic function. There is severe akinesis of the basiliar-mid inferior left ventricular segment.  2. There is mild dilatation of the ascending aorta.  3. Ao Asc diam:3.75 cm.  4. The tricuspid valve is normal in structure. Regurgitation is mild by color flow Doppler.  5. The mitral valve is normal in structure Regurgitation is trivial by color flow Doppler.  6. Compared to prior echo, there is now mid and basal inferior akinesis. The endocardial segments are not well visualized and Definity contrast would be helpful to delineate if this is a new wall motion abnormality.  Coronary/Graft Acute MI Revascularization  08/27/18  LEFT HEART CATH AND CORONARY ANGIOGRAPHY  Conclusion     The left ventricular systolic function is normal.  LV end diastolic pressure is normal.  The left ventricular ejection fraction is greater than 65% by visual estimate.  There is trivial (1+) mitral regurgitation.  There is no aortic valve stenosis.  Prox RCA to Mid RCA lesion is 100% stenosed.  Post intervention, there is a 0% residual stenosis.  A drug-eluting stent was successfully placed using a [place] [id number].  Prox RCA lesion is 20% stenosed.  Prox Cx lesion is 90% stenosed.   SUMMARY  Severe two-vessel  CAD with 100% occlusion of the prox-midRCA (DES PCI Resolute Onynx DES 2.75 mm x 18 mm - 3.1 mm) and focal 90% Cx-OM (plan staged PCI)  Well-preserved LVEF with mild inferior hypokinesis.  Normal LVEDP.  RECOMMENDATIONS  Admit overnight into the CVICU --run Aggrastat for 6 hours or until bottle  complete, then start IV heparin 8 hours after TR band removal for existing circumflex CAD.  With reactive airway disease/COPD medications listed, will start low-dose metoprolol tartrate (titrate as possible).  Anticipate starting ACE inhibitor or ARB following stage PCI.  Plan staged PCI of Cx-OM on February 5  Increase rosuvastatin dose to 40 mg, check fasting lipid panel.  Close diabetes control (check A1c)      History of Present Illness     [name] a 68 y.o.femalewith history of diabetes, hypertension, obesity and hyperlipidemia who presented with ongoing substernal chest pain who found to have inferior STEMI.  [name] apparently had an episode of chest pain about 2 weeks ago and was evaluated in the ER with an EKG at that time that was normal.  She ruled out for MI and was discharged home with the thought of it being an anxiety attack.  However represented 2/3 with chest pain, was much worse associated with dyspnea and nausea as well as a sense of overall achy ness.This is definitely worse than the last episode.  Chest pain at worst was 9-10 out of 10.  Currently upon arrival to the ER was 9 out of 10.  She received 4 mg of morphine in the ER after getting 325 mg of aspirin in the EMS.  She received 4000 his IV heparin.  EKG shows subtle inferior ST elevations in III, 2 and aVF along with posterior lead elevation and v4 --code STEMI was called in route and upon arrival.  Brought emergently to cardiac catheterization lab for catheterization and PCI.  Hospital Course     Consultants: None  1. Inferior STEMI - Cath showed severe two-vessel CAD with 100% occlusion of the prox-midRCA (DES PCI Resolute Onynx DES 2.75 mm x 18 mm - 3.1 mm) and focal 90% Cx-OM (staged PCI). No recurrent chest pain. Troponin peaked to 21.69. Echo showed LVEF of 50-55% with new  mid and basal inferior akinesis. She was treated with IV heparin and eventually underwent staged successful angioplasty and  drug-eluting stent placement to the mid left circumflex via the right radial artery. She had hematoma post procedure at radial cath site which improved.  Ambulated well.  - Continue ASA, Brillinta, BB, Imdur and statin. Consider addition of ACE/ARB as about patient one normalization of renal function.   2. HLD - 08/28/2018: Cholesterol 168; HDL 46; LDL Cholesterol 85; Triglycerides 187; VLDL 37 - Continue Crestor 46m daily.  - Lipid panel and LFTS in 6-8 weeks.   3. AKI - seems baseline Scr between 1.0-1.3 in past year. Here gradually up after first cath 1.1>>1.15>>1.19>>1.31 today. Follow as outpatient.  - Avoid nephrotoxic agent.  4. HTN - BP stable on BB  5. DM - Poorly controlled. HbA1c > 14 as outpatient. Seems not taking insuline as prescribed. Advised to follow up with PCP. Lantus changed to Novolin 70/30 per diabetic coordinate recommendations until she follows up with PCP due to cost.   Discharge Vitals Blood pressure 111/65, pulse 80, temperature 98.7 F (37.1 C), temperature source Oral, resp. rate 19, height 5' 6"  (1.676 m), weight 105 kg, SpO2 96 %.  Filed Weights   08/27/18 2148 08/30/18  0550  Weight: 104.3 kg 105 kg   Physical Exam  Constitutional: She is oriented to person, place, and time and well-developed, well-nourished, and in no distress.  HENT:  Head: Normocephalic and atraumatic.  Eyes: Pupils are equal, round, and reactive to light. Conjunctivae are normal.  Neck: Normal range of motion. Neck supple.  Cardiovascular:  Right radial cath site with 1x1 cm hematoma seems improved from yesterday based on markings  Pulmonary/Chest: Effort normal and breath sounds normal.  Abdominal: Soft. Bowel sounds are normal.  Neurological: She is alert and oriented to person, place, and time.  Skin: Skin is warm and dry.  Psychiatric: Affect normal.    Labs & Radiologic Studies    CBC Recent Labs    08/27/18 2152  08/29/18 0829 08/30/18 0440  WBC 9.4   < > 8.7 8.5   NEUTROABS 4.7  --   --   --   HGB 12.3   < > 11.6* 10.4*  HCT 39.0   < > 36.6 32.4*  MCV 87.8   < > 87.1 86.6  PLT 302   < > 290 278   < > = values in this interval not displayed.   Basic Metabolic Panel Recent Labs    08/29/18 1150 08/30/18 0440  NA 143 139  K 4.6 4.3  CL 109 107  CO2 24 22  GLUCOSE 118* 205*  BUN 13 13  CREATININE 1.19* 1.31*  CALCIUM 9.1 8.3*   Liver Function Tests Recent Labs    08/27/18 2152  AST 19  ALT 16  ALKPHOS 104  BILITOT 0.6  PROT 7.0  ALBUMIN 3.5   Cardiac Enzymes Recent Labs    08/28/18 0925 08/28/18 1415 08/28/18 2115  TROPONINI 17.04* 14.99* 13.27*   Hemoglobin A1C Recent Labs    08/28/18 0303  HGBA1C 13.0*   Fasting Lipid Panel Recent Labs    08/28/18 0303  CHOL 168  HDL 46  LDLCALC 85  TRIG 187*  CHOLHDL 3.7   _____________  Dg Chest 2 View  Result Date: 08/12/2018 CLINICAL DATA:  Pt. Complains of "heaviness" in chest since this AM with SOB. Pain in bilateral shoulders with numbness in left hand/fingers. Hx of Diabetes Mellitus.CP EXAM: CHEST - 2 VIEW COMPARISON:  12/05/2017 FINDINGS: Port in the anterior chest wall with tip in distal SVC. Normal cardiac silhouette ectatic aorta. Low lung volumes. No effusion, infiltrate or pneumothorax. Degenerate changes shoulders. IMPRESSION: No acute cardiopulmonary process. Electronically Signed   By: Suzy Bouchard M.D.   On: 08/12/2018 23:17   Disposition   Pt is being discharged home today in good condition.  Follow-up Plans & Appointments    Follow-up Information    Lendon Colonel, NP. Go on 09/05/2018.   Specialties:  Nurse Practitioner, Radiology, Cardiology Why:  @9am  for hospital follow up Contact information: [address] [place] [address] [phone]          Discharge Instructions    Amb Referral to Cardiac Rehabilitation   Complete by:  As directed    Diagnosis:   STEMI Coronary Stents     Diet - low sodium heart  healthy   Complete by:  As directed    Discharge instructions   Complete by:  As directed    No driving for 2 weeks. No lifting over 10 lbs for 4 weeks. No sexual activity for 4 weeks. You may not return to work until cleared by your cardiologist. Keep procedure site clean & dry. If  you notice increased pain, swelling, bleeding or pus, call/return!  You may shower, but no soaking baths/hot tubs/pools for 1 week.  Hold metformin today. Resume tomorrow.   Increase activity slowly   Complete by:  As directed       Discharge Medications   Allergies as of 08/30/2018      Reactions   Adhesive [tape] Other (See Comments)   Burn Skin   Codeine Other (See Comments)   paranoid   Zocor [simvastatin - High Dose] Other (See Comments)   Muscle spasms   Penicillins Rash   Has patient had a PCN reaction causing immediate rash, facial/tongue/throat swelling, SOB or lightheadedness with hypotension: Yes Has patient had a PCN reaction causing severe rash involving mucus membranes or skin necrosis: No Has patient had a PCN reaction that required hospitalization does not remember.  Has patient had a PCN reaction occurring within the last 10 years: No If all of the above answers are "NO", then may proceed with Cephalosporin use.      Medication List    STOP taking these medications   Insulin Glargine 100 UNIT/ML Solostar Pen Commonly known as:  LANTUS SOLOSTAR     TAKE these medications   albuterol (2.5 MG/3ML) 0.083% nebulizer solution Commonly known as:  PROVENTIL Take 3 mLs (2.5 mg total) by nebulization every 6 (six) hours as needed for wheezing or shortness of breath. Notes to patient:  As needed for shortness of breath or wheezing   albuterol 108 (90 Base) MCG/ACT inhaler Commonly known as:  PROVENTIL HFA;VENTOLIN HFA Inhale 2 puffs into the lungs every 6 (six) hours as needed for wheezing or shortness of breath. Notes to patient:  As needed for shortness of breath or wheezing   aspirin  81 MG tablet Take 81 mg by mouth at bedtime. Notes to patient:  Prevents clotting in the stent and heart attack    fluticasone 50 MCG/ACT nasal spray Commonly known as:  FLONASE Place 2 sprays into both nostrils daily. Notes to patient:  As needed for nasal congestion/allergies   gabapentin 400 MG capsule Commonly known as:  NEURONTIN Take 2 capsules (800 mg total) by mouth 3 (three) times daily. Notes to patient:  Treat nerve pain    glimepiride 4 MG tablet Commonly known as:  AMARYL Take 1 tablet (4 mg total) by mouth 2 (two) times daily. Notes to patient:  Controls blood sugar   insulin NPH-regular Human (70-30) 100 UNIT/ML injection Commonly known as:  NOVOLIN 70/30 RELION Inject 15 Units into the skin 2 (two) times daily with a meal. Notes to patient:  Combination of short acting insulin and long acting insulin to control blood sugar    ipratropium-albuterol 0.5-2.5 (3) MG/3ML Soln Commonly known as:  DUONEB Take 3 mLs by nebulization every 6 (six) hours as needed. What changed:  reasons to take this Notes to patient:  As needed for shortness of breath or wheezing   isosorbide mononitrate 30 MG 24 hr tablet Commonly known as:  IMDUR Take 0.5 tablets (15 mg total) by mouth daily. Notes to patient:  Lowers blood pressure  Increases blood flow to heart Decreases chest pain    metFORMIN 500 MG tablet Commonly known as:  GLUCOPHAGE Take 1 tablet (500 mg total) by mouth 2 (two) times daily with a meal. Notes to patient:  Controls blood sugar DO NOT TAKE TODAY OR TOMORROW  Taking within 48 hours of dye being given may effect kidney function   metoprolol tartrate 25 MG  tablet Commonly known as:  LOPRESSOR Take 1 tablet (25 mg total) by mouth 2 (two) times daily. Notes to patient:  Decreases work of the heart Lowers blood pressure and heart rate    multivitamin with minerals Tabs tablet Take 1 tablet by mouth daily. Notes to patient:  Supplement    nitroGLYCERIN 0.4  MG SL tablet Commonly known as:  NITROSTAT Place 1 tablet (0.4 mg total) under the tongue every 5 (five) minutes as needed. Notes to patient:  As needed for chest pain    pantoprazole 40 MG tablet Commonly known as:  PROTONIX Take 1 tablet (40 mg total) by mouth daily. Notes to patient:  Treat acid reflux Prevents heartburn   rosuvastatin 40 MG tablet Commonly known as:  CRESTOR Take 1 tablet (40 mg total) by mouth at bedtime. What changed:    medication strength  how much to take  how to take this  when to take this  additional instructions   sertraline 50 MG tablet Commonly known as:  ZOLOFT TAKE 1 TABLET BY MOUTH EVERY DAY Notes to patient:  Depression/anxiety   sucralfate 1 g tablet Commonly known as:  CARAFATE Take 1 tablet (1 g total) by mouth 2 (two) times daily. Notes to patient:  Treat stomach ulcers   ticagrelor 90 MG Tabs tablet Commonly known as:  BRILINTA Take 1 tablet (90 mg total) by mouth 2 (two) times daily. Notes to patient:  Prevents clotting in the stent and heart attack    traMADol 50 MG tablet Commonly known as:  ULTRAM TAKE 2 TABLETS BY MOUTH 3 TIMES DAILY AS NEEDED FOR MODERATE PAIN What changed:  See the new instructions. Notes to patient:  As needed for moderate pain         Acute coronary syndrome (MI, NSTEMI, STEMI, etc) this admission?: Yes.     AHA/ACC Clinical Performance & Quality Measures: 1. Aspirin prescribed? - Yes 2. ADP Receptor Inhibitor (Plavix/Clopidogrel, Brilinta/Ticagrelor or Effient/Prasugrel) prescribed (includes medically managed patients)? - Yes 3. Beta Blocker prescribed? - Yes 4. High Intensity Statin (Lipitor 40-39m or Crestor 20-471m prescribed? - Yes 5. EF assessed during THIS hospitalization? - Yes 6. For EF <40%, was ACEI/ARB prescribed? - Not Applicable (EF >/= 4014%Will likely add as outpatient  7. For EF <40%, Aldosterone Antagonist (Spironolactone or Eplerenone) prescribed? - Not Applicable (EF >/=  4044%8. Cardiac Rehab Phase II ordered (Included Medically managed Patients)? - Yes     Outstanding Labs/Studies   BMET at follow up Lipid panel and LFTS in 6 weeks   Duration of Discharge Encounter   Greater than 30 minutes including physician time.  Signed, Crista LuriahGreenwoodPA 08/30/2018, 12:00 PM    Patient seen and examined. Agree with assessment and plan. Feels much better; no dyspnea with walking. Ecchymosis at R radial site. OK for dc today; needs prompt f/u with primary MD for more optimal diabetic management. Cardiac F/U with Dr. HaErline HauMD, [facility]/12/2018 12:00 PM

## 2018-08-30 NOTE — Progress Notes (Addendum)
Inpatient Diabetes Program Recommendations  AACE/ADA: New Consensus Statement on Inpatient Glycemic Control (2015)  Target Ranges:  Prepandial:   less than 140 mg/dL      Peak postprandial:   less than 180 mg/dL (1-2 hours)      Critically ill patients:  140 - 180 mg/dL   Lab Results  Component Value Date   GLUCAP 157 (H) 08/30/2018   HGBA1C 13.0 (H) 08/28/2018    Review of Glycemic Control Results for [name], [name] (MRN [id number]) as of 08/30/2018 10:20  Ref. Range 08/29/2018 12:40 08/29/2018 16:08 08/29/2018 20:03 08/30/2018 05:58  Glucose-Capillary Latest Ref Range: 70 - 99 mg/dL 100 (H) 100 (H) 345 (H) 157 (H)   Diabetes history: Type 2 DM Outpatient Diabetes medications: Metformin 500 mg BID Current orders for Inpatient glycemic control: Lantus 20 units QD, Metformin 500 mg BID, Amaryl 4 mg BID  Inpatient Diabetes Program Recommendations:     Consider more affordable regimen for outpatient use: Novolin 70/30 insulin pen 15 units BID.   Spoke with patient regarding outpatient diabetes management. Patient has been unable to afford Lantus and had been using samples from PCP office. However, this amount was limited and patient does not have anymore available. Reviewed patient's current A1c of 13.0%. Explained what a A1c is and what it measures. Also reviewed goal A1c with patient, importance of good glucose control @ home, and blood sugar goals. Reviewed patho of DM, need for insulin, role of pancreas, vascular damage and changes over time, and comorbidites.   Patient has supplies and meter, however, admits to not checking blood sugars. She is not interested in this at this time. Stressed the importance of checking CBGs to determine insulin dosage adjustments. Patient encouraged to make appointment with PCP and her endo, Dr Cruzita Lederer.  Encouraged patient to apply for Patient Assistance Program through Medicare, however, patient may not be eligible since she lacks Part D coverage and has no put  in enough money towards prescriptions. In this case, would recommend changing insulin to Novolin 70/30 a more affordable option until patient is able to follow up. Patient now has appointment for 09/07/2018 with PCP. PA to place orders on discharge summary.  Educated patient on principles of 70/30, to take with meals, survival skills, and signs and symptoms of hypoglycemia. Reviewed Relion information for affordability. Patient very appreciative and receptive to this plan. Patient has no further questions at this time.   Thanks, Bronson Curb, MSN, RNC-OB Diabetes Coordinator [phone] (8a-5p)

## 2018-08-30 NOTE — Consult Note (Signed)
   [facility] Inpatient Consult   08/30/2018  DONATELLA WALSKI 1950/08/05 [id number]  Patient screened for potential [facility] Care Management services due to unplanned readmission risk score of 25%, high.   Went to bedside to speak with Ms. Yambao to explain [facility]. Patient states that she does not like people calling her a lot or coming to her home. Patient was awaiting her ride home for discharge and did not want to discuss the care management services at this time.  She pleasantly accepts [facility] CM brochure and stated that she would call the toll free number if she changes her mind.  Ms. Diez declines services at this time.  Informed patient that the program is voluntary and she may call with questions at a later time. Patient verbalizes understanding.   Netta Cedars, MSN, [facility] Liaison Nurse Mobile Phone [phone]  Toll free office [phone]

## 2018-08-30 NOTE — Progress Notes (Signed)
TR BAND REMOVAL  LOCATION:    right radial  DEFLATED PER PROTOCOL:    Yes.    TIME BAND OFF / DRESSING APPLIED:    2120   SITE UPON ARRIVAL:    Level 1  SITE AFTER BAND REMOVAL:    Level 1( bruised)  CIRCULATION SENSATION AND MOVEMENT:    Within Normal Limits   Yes.    COMMENTS:   Post TR Band instructions given, DSD applie, good cap refill

## 2018-08-30 NOTE — Telephone Encounter (Signed)
Patient has appt with Jory Sims on 2/12 requested by [facility].

## 2018-08-30 NOTE — Care Management Note (Signed)
Case Management Note  Patient Details  Name: DORIANA MAZURKIEWICZ MRN: [id number] Date of Birth: 1951-06-17  Subjective/Objective:   From home, s/p stent intervention, will be on brilinta, NCM spoke with patient ,gave her the co pay amt of 47.00 she states this will be too high with her paying 125.00 for her insulin as well (lantus).  NCM contacted diabetes coordinator, they will come up to see patient to see if they have any coupons that they can give her for her insulin, also patient will speak with her endocrinologist MD to see if they can change her to a more affordable insulin.                   Action/Plan: DC home when ready.   Expected Discharge Date:  08/30/18               Expected Discharge Plan:  Home/Self Care  In-House Referral:  NA  Discharge planning Services  CM Consult, Medication Assistance(Brilinta benefits check)  Post Acute Care Choice:  NA Choice offered to:  NA  DME Arranged:  N/A DME Agency:  NA  HH Arranged:  NA HH Agency:  NA  Status of Service:  Completed, signed off  If discussed at [place] of Stay Meetings, dates discussed:    Additional Comments:  Zenon Mayo, RN 08/30/2018, 9:49 AM

## 2018-08-30 NOTE — Care Management Important Message (Signed)
Important Message  Patient Details  Name: [name] MRN: [id number] Date of Birth: Dec 05, 1950   Medicare Important Message Given:  Yes    Alese Furniss P Barbaraann Avans 08/30/2018, 12:57 PM

## 2018-08-30 NOTE — Progress Notes (Signed)
CARDIAC REHAB PHASE I   PRE:  Rate/Rhythm: 82 SR  BP:  Supine: 111/65  Sitting:   Standing:    SaO2: 98%RA  MODE:  Ambulation: 500 ft   POST:  Rate/Rhythm: 103 ST  BP:  Supine:   Sitting: 117/59  Standing:    SaO2:  0815-0900 Pt walked 500 ft on RA with rolling walker independently and tolerated well. No CP. Pt stated she likes to walk. Completed ed by reviewing ex ed. Stressed again the importance of brilinta with stent. Pt voiced understanding. To recliner with call bell. Referred to [place] CRP 2 on 2/4.   [name], RN BSN  08/30/2018 8:57 AM

## 2018-08-30 NOTE — Telephone Encounter (Signed)
-----   Message from [place], [place] sent at 08/30/2018  7:51 AM EST ----- Patient will need [facility] phone call. Appointment has been made.

## 2018-08-31 ENCOUNTER — Telehealth: Payer: Self-pay | Admitting: Internal Medicine

## 2018-08-31 ENCOUNTER — Ambulatory Visit (INDEPENDENT_AMBULATORY_CARE_PROVIDER_SITE_OTHER): Payer: Medicare Other | Admitting: Internal Medicine

## 2018-08-31 ENCOUNTER — Encounter: Payer: Self-pay | Admitting: Internal Medicine

## 2018-08-31 VITALS — BP 108/70 | HR 82 | Ht 66.0 in | Wt 233.0 lb

## 2018-08-31 DIAGNOSIS — E1121 Type 2 diabetes mellitus with diabetic nephropathy: Secondary | ICD-10-CM

## 2018-08-31 DIAGNOSIS — E042 Nontoxic multinodular goiter: Secondary | ICD-10-CM

## 2018-08-31 MED ORDER — INSULIN DEGLUDEC 200 UNIT/ML ~~LOC~~ SOPN: 20.0000 [IU] | PEN_INJECTOR | Freq: Every day | SUBCUTANEOUS | 3 refills | Status: DC

## 2018-08-31 MED ORDER — EMPAGLIFLOZIN 10 MG PO TABS: 10.0000 mg | ORAL_TABLET | Freq: Every day | ORAL | 3 refills | Status: DC

## 2018-08-31 NOTE — Telephone Encounter (Signed)
Patients states that she called the pharmacy and she can not afford $300 for jardiance and tresiba and is requesting alternatives.  Please inform patient on what to do, thanks

## 2018-08-31 NOTE — Telephone Encounter (Signed)
Let us try to send Toujeo 20 units and hopefully Invokana 100 mg daily is covered.

## 2018-08-31 NOTE — Progress Notes (Signed)
Patient ID: [name], female   DOB: 01/13/1951, 68 y.o.   MRN: [id number]   HPI  [name] is a 68 y.o.-year-old female, returning for f/u for MNG and DM2, uncontrolled, non-insulin dependent, with complications (CAD  - s/p NSTEMI 08/2018; cerebrovascular ds - s/p CVA; CKD stage 3, PN). Last visit 1 year and 6 months ago. PCP: [name].  She had a STEMI 5 days ago >> had to have stents placed.  HbA1c was very high at that time.  PCP contacted me about this yesterday and patient comes urgently for an appointment today.  MNG -Status post RAI treatment for an overactive thyroid nodule in 2011.  Review thyroid imaging reports:  11/24/2009: 1. Normal (25%) 24 hour radioiodine uptake by the thyroid gland. 2. Autonomous nodule superiorly in the right lobe with relative suppression of activity elsewhere in the gland. There is a possible cold nodule in the mid right lobe, corresponding with the dominant nodule seen on prior CT.  01/11/2010: RAI Tx for the R autonomous thyroid nodule  09/28/2014 Thyroid U/S: 6 larger nodules, 2 of them of 3 cm   09/21/2015 Thyroid U/S: Right thyroid lobe: 4.4 x 2.5 x 2.5 cm.  - 12 x 13 x 10 mm upper pole hypoechoic nodule with calcification. Previously, this measured up to 2.0 cm.  - Complex mid lobe nodule measures 25 x 22 x 18 mm and previously measured up to 35 mm.  - Smaller lower pole hypoechoic nodule measures 10 mm.  Left thyroid lobe: 5.2 x 1.6 x 2.0 cm.  - Mid lobe nodule measures 29 x 17 x 15 mm. Previously, it measured up to 30 mm.  - 8 mm upper pole complex nodule.  - Lower pole hypoechoic nodule with calcification measures 18 x 15 x 14 mm. Previously, it measured up to 20 mm.  Isthmus Thickness: 2 mm.  5 mm nodule.  Lymphadenopathy: None visualized.  IMPRESSION: Multiple bilateral nodules are smaller than on prior studies. Stability or regression over 2 years supports benign etiology.  02/21/2017 Thyroid U/S: Nodule #  1:Right; Superior Maximum size: 1.2 cm; Other 2 dimensions: 1.1 x 1.0 cm, previously, 1.3 x 1.2 x 1.0 cm Composition: solid/almost completely solid (2) Echogenicity: hypoechoic (2) Echogenic foci: macrocalcifications (1) *Given size (>/= 1 - 1.4 cm) and appearance, a follow-up ultrasound in 1 year should be considered based on TI-RADS criteria.  Nodule # 2: Right; Mid Maximum size: 2.6 cm; Other 2 dimensions: 2.2 x 1.8 cm, previously, 2.5 x 1.8 x 2.2 cm Composition: solid/almost completely solid (2) Echogenicity: hypoechoic (2) **Given size (>/= 1.5 cm) and appearance, fine needle aspiration of this moderately suspicious nodule should be considered based on TI-RADS criteria.  Nodule # 3: Right; Inferior Maximum size: 1.2 cm; Other 2 dimensions: 0.6 x 0.9 cm, previously, 1.1 x 1.0 x 0.9 cm Composition: solid/almost completely solid (2) Echogenicity: hyperechoic (1) Given size (<1.4 cm) and appearance, this nodule does NOT meet TI-RADS criteria for biopsy or dedicated follow-up.  Nodule # 4: Left; Mid Maximum size: 2.7 cm; Other 2 dimensions: 1.4 x 1.9 cm, previously, 2.9 x 1.7 x 1.5 cm Composition: solid/almost completely solid (2) Echogenicity: hypoechoic (2) **Given size (>/= 1.5 cm) and appearance, fine needle aspiration of this moderately suspicious nodule should be considered based on TI-RADS criteria.  Nodule # 5: Left; Inferior Maximum size: 1.7 cm; Other 2 dimensions: 1.3 x 1.3 cm, previously, 1.8 x 1.4 x 1.5 cm Composition: solid/almost completely solid (2) Echogenicity:  hypoechoic (2) **Given size (>/= 1.5 cm) and appearance, fine needle aspiration of this moderately suspicious nodule should be considered based on TI-RADS criteria.  IMPRESSION: Multiple bilateral nodules are all stable in appearance and size. Nodules 2, 4, and 5 continue to meet criteria for biopsy. Nodule 1 meets criteria for annual follow-up.  I previously suggested a biopsy of the dominant  3 nodules, but she refused, in favor of just following the nodules by ultrasound and clinically.  She was lost for follow-up 1.5 years ago.  Reviewed patient's TFTs: Lab Results  Component Value Date   TSH 3.061 05/13/2016   TSH 1.241 04/01/2016   TSH 4.112 01/15/2016   TSH 2.15 12/30/2015   TSH 2.435 07/31/2014    TSH 1.348 10/12/2012   TSH 2.559 03/11/2011   TSH 3.155 01/05/2011   TSH 1.823 09/24/2010   TSH 1.925 06/25/2010   Pt denies: - feeling nodules in neck - dysphagia - choking - SOB with lying down But she does have hoarseness  Pt does not have a FH of thyroid ds. No FH of thyroid cancer. No h/o radiation tx to head or neck.  No herbal supplements. No Biotin use. No recent steroids use.   She also has a history of stomach cancer - sx 2012.  DM2 -Greatly worsened since last visit  Last HbA1c: Lab Results  Component Value Date   HGBA1C 13.0 (H) 08/28/2018   HGBA1C >14.0 (H) 08/20/2018   HGBA1C 12.5 (H) 04/09/2018   She is on: - Metformin 500 mg 2x a day Stopped Amaryl 4 mg 2x a day.  She is still not checking sugars at home due to tremors and neuropathy. Shee does have 3 glucometers at home - am: n/c - 2h after b'fast: 120s >> n/c - lunch: n/c - 2h after lunch: n/c - dinner: n/c - 2h after dinner: n/c - bedtime: n/c  + CKD; last BUN/Cr: Lab Results  Component Value Date   BUN 13 08/30/2018   Lab Results  Component Value Date   CREATININE 1.31 (H) 08/30/2018   Lab Results  Component Value Date   GFRAA 49 (L) 08/30/2018   GFRAA 55 (L) 08/29/2018   GFRAA 57 (L) 08/28/2018   GFRAA 48 (L) 08/27/2018   GFRAA 48 (L) 08/12/2018   GFRAA 45 (L) 06/04/2018   GFRAA >60 11/07/2017   GFRAA >60 11/05/2017   GFRAA 48 (L) 11/04/2017   GFRAA 58 (L) 11/03/2017   + HL; latest lipid panel: Lab Results  Component Value Date   CHOL 168 08/28/2018   HDL 46 08/28/2018   LDLCALC 85 08/28/2018   LDLDIRECT 136.0 08/20/2018   TRIG 187 (H) 08/28/2018    CHOLHDL 3.7 08/28/2018  She is not on a statin.  She is on ASA 81.  She has peripheral neuropathy and is on Neurontin.  ROS: Constitutional: no weight gain/no weight loss, no fatigue, no subjective hyperthermia, no subjective hypothermia Eyes: no blurry vision, no xerophthalmia ENT: no sore throat, + see HPI Cardiovascular: no CP/no SOB/no palpitations/no leg swelling Respiratory: no cough/no SOB/no wheezing Gastrointestinal: no N/no V/no D/no C/no acid reflux Musculoskeletal: no muscle aches/no joint aches Skin: no rashes, no hair loss Neurological: no tremors/no numbness/no tingling/no dizziness  I reviewed pt's medications, allergies, PMH, social hx, family hx, and changes were documented in the history of present illness.  Past Medical History:  Diagnosis Date  . Asthma   . Degenerative joint disease   . Diabetes mellitus   . Dyspnea   .  GERD (gastroesophageal reflux disease)   . Hyperlipidemia    no longer on medication for this  . Hypertension    patient denies  . Myocardial infarction ([facility]) 08/2018  . Neuropathy    bilateral hands and feet  . Sickle cell trait ([place])   . Sleep apnea   . stomach ca dx'd 2010   chemo/xrt comp 05/2009  . TIA (transient ischemic attack) 07/2015   Past Surgical History:  Procedure Laterality Date  . APPENDECTOMY    . CESAREAN SECTION    . COLON SURGERY     colonscopy  . COLONOSCOPY WITH PROPOFOL N/A 06/01/2016   Procedure: COLONOSCOPY WITH PROPOFOL;  Surgeon: Wilford Corner, MD;  Location: [facility] ENDOSCOPY;  Service: Endoscopy;  Laterality: N/A;  . CORONARY STENT INTERVENTION N/A 08/29/2018   Procedure: CORONARY STENT INTERVENTION;  Surgeon: Wellington Hampshire, MD;  Location: [facility] LAB;  Service: Cardiovascular;  Laterality: N/A;  . CORONARY/GRAFT ACUTE MI REVASCULARIZATION N/A 08/27/2018   Procedure: Coronary/Graft Acute MI Revascularization;  Surgeon: Leonie Man, MD;  Location: [place];  Service: Cardiovascular;   Laterality: N/A;  . ESOPHAGOGASTRODUODENOSCOPY N/A 10/15/2012   Procedure: ESOPHAGOGASTRODUODENOSCOPY (EGD);  Surgeon: Lear Ng, MD;  Location: [facility];  Service: Endoscopy;  Laterality: N/A;  . ESOPHAGOGASTRODUODENOSCOPY N/A 01/15/2016   Procedure: ESOPHAGOGASTRODUODENOSCOPY (EGD);  Surgeon: Ronald Lobo, MD;  Location: [facility] ENDOSCOPY;  Service: Endoscopy;  Laterality: N/A;  . HERNIA REPAIR    . LEFT HEART CATH AND CORONARY ANGIOGRAPHY N/A 08/27/2018   Procedure: LEFT HEART CATH AND CORONARY ANGIOGRAPHY;  Surgeon: Leonie Man, MD;  Location: [facility];  Service: Cardiovascular;  Laterality: N/A;  . SHOULDER SURGERY     Left  . TOTAL HIP ARTHROPLASTY     Right  . TOTAL KNEE ARTHROPLASTY     Left   History   Social History  . Marital Status: Divorced    Spouse Name: N/A  . Number of Children: N/A   Occupational History  . Not on file.   Social History Main Topics  . Smoking status: Former Smoker -- 1.00 packs/day for 15 years    Types: Cigarettes    Start date: 09/27/1973    Quit date: 10/15/1988  . Smokeless tobacco: Never Used     Comment: quit smoking 14 years ago  . Alcohol Use: No  . Drug Use: No   Current Outpatient Medications on File Prior to Visit  Medication Sig Dispense Refill  . albuterol (PROVENTIL HFA;VENTOLIN HFA) 108 (90 Base) MCG/ACT inhaler Inhale 2 puffs into the lungs every 6 (six) hours as needed for wheezing or shortness of breath. 1 Inhaler 2  . albuterol (PROVENTIL) (2.5 MG/3ML) 0.083% nebulizer solution Take 3 mLs (2.5 mg total) by nebulization every 6 (six) hours as needed for wheezing or shortness of breath. 75 mL 12  . aspirin 81 MG tablet Take 81 mg by mouth at bedtime.    . fluticasone (FLONASE) 50 MCG/ACT nasal spray Place 2 sprays into both nostrils daily. 16 g 5  . gabapentin (NEURONTIN) 400 MG capsule Take 2 capsules (800 mg total) by mouth 3 (three) times daily. 180 capsule 7  . glimepiride (AMARYL) 4 MG tablet  Take 1 tablet (4 mg total) by mouth 2 (two) times daily. 180 tablet 1  . insulin NPH-regular Human (NOVOLIN 70/30 RELION) (70-30) 100 UNIT/ML injection Inject 15 Units into the skin 2 (two) times daily with a meal. 10 mL 3  . ipratropium-albuterol (DUONEB) 0.5-2.5 (  3) MG/3ML SOLN Take 3 mLs by nebulization every 6 (six) hours as needed. (Patient taking differently: Take 3 mLs by nebulization every 6 (six) hours as needed (sob and wheezing). ) 360 mL 2  . isosorbide mononitrate (IMDUR) 30 MG 24 hr tablet Take 0.5 tablets (15 mg total) by mouth daily. 30 tablet 6  . metFORMIN (GLUCOPHAGE) 500 MG tablet Take 1 tablet (500 mg total) by mouth 2 (two) times daily with a meal. 180 tablet 3  . metoprolol tartrate (LOPRESSOR) 25 MG tablet Take 1 tablet (25 mg total) by mouth 2 (two) times daily. 60 tablet 6  . Multiple Vitamin (MULTIVITAMIN WITH MINERALS) TABS tablet Take 1 tablet by mouth daily.    . nitroGLYCERIN (NITROSTAT) 0.4 MG SL tablet Place 1 tablet (0.4 mg total) under the tongue every 5 (five) minutes as needed. 25 tablet 12  . pantoprazole (PROTONIX) 40 MG tablet Take 1 tablet (40 mg total) by mouth daily. 30 tablet 3  . rosuvastatin (CRESTOR) 40 MG tablet Take 1 tablet (40 mg total) by mouth at bedtime. 30 tablet 6  . sertraline (ZOLOFT) 50 MG tablet TAKE 1 TABLET BY MOUTH EVERY DAY (Patient taking differently: Take 50 mg by mouth daily. ) 90 tablet 1  . sucralfate (CARAFATE) 1 g tablet Take 1 tablet (1 g total) by mouth 2 (two) times daily. (Patient not taking: Reported on 08/28/2018) 60 tablet 3  . ticagrelor (BRILINTA) 90 MG TABS tablet Take 1 tablet (90 mg total) by mouth 2 (two) times daily. 60 tablet 11  . traMADol (ULTRAM) 50 MG tablet TAKE 2 TABLETS BY MOUTH 3 TIMES DAILY AS NEEDED FOR MODERATE PAIN (Patient taking differently: Take 100 mg by mouth 3 (three) times daily. ) 180 tablet 0   Current Facility-Administered Medications on File Prior to Visit  Medication Dose Route Frequency  Provider Last Rate Last Dose  . sodium chloride flush (NS) 0.9 % injection 10 mL  10 mL Intravenous PRN [place], Holli Humbles, NP   10 mL at 09/13/16 1146   Allergies  Allergen Reactions  . Adhesive [Tape] Other (See Comments)    Burn Skin  . Codeine Other (See Comments)    paranoid  . Zocor [Simvastatin - High Dose] Other (See Comments)    Muscle spasms  . Penicillins Rash    Has patient had a PCN reaction causing immediate rash, facial/tongue/throat swelling, SOB or lightheadedness with hypotension: Yes Has patient had a PCN reaction causing severe rash involving mucus membranes or skin necrosis: No Has patient had a PCN reaction that required hospitalization does not remember.  Has patient had a PCN reaction occurring within the last 10 years: No If all of the above answers are "NO", then may proceed with Cephalosporin use.    Family History  Problem Relation Age of Onset  . Diabetes Brother   . Alzheimer's disease Father 42  . Cancer Mother 36  . Diabetes Maternal Grandmother   . Cancer Maternal Grandfather        lung  . Suicidality Neg Hx   . Depression Neg Hx   . Dementia Neg Hx   . Anxiety disorder Neg Hx    PE: BP 108/70   Pulse 82   Ht 5' 6"  (1.676 m)   Wt 233 lb (105.7 kg)   SpO2 96%   BMI 37.61 kg/m  Wt Readings from Last 3 Encounters:  08/31/18 233 lb (105.7 kg)  08/30/18 231 lb 7.7 oz (105 kg)  08/20/18 231 lb (  104.8 kg)   Constitutional: overweight, in NAD Eyes: PERRLA, EOMI, no exophthalmos ENT: moist mucous membranes, no thyromegaly, no cervical lymphadenopathy Cardiovascular: RRR, No MRG Respiratory: CTA B Gastrointestinal: abdomen soft, NT, ND, BS+ Musculoskeletal: no deformities, strength intact in all 4 Skin: moist, warm, no rashes Neurological: no tremor with outstretched hands, DTR normal in all 4  ASSESSMENT: 1. MNG  2. DM2, insulin-independent, uncontrolled, with complications - CAD  - s/p NSTEMI 08/2018 - cerebrovascular ds - s/p  CVA - CKD stage 3 - PN  3.  Obesity  PLAN: 1. MNG  -Reviewed the latest report of her thyroid ultrasound from 2018: Nodules are either stable (1), or smaller (the rest).  We discussed that she will need another ultrasound and may need to biopsy the nodules that appear worrisome.  I suggested this at last visit, but she had traveling to do over the summer and she agreed to let me know when she is ready for further investigation of her nodules.  Of note, the nodules have not been biopsied in the past -No neck compression symptoms -TFTs were reviewed and these were normal in the past -For now, she would like to hold off checking in on the ultrasound  2. DM2, insulin-independent, uncontrolled -She returns after long absence.  At that time, she came off metformin due to fear for dementia as a side effect and her HbA1c was high.  However, last visit with PCP, this was extremely high, at 13%, much increased from previous.  Her weight gain of 15 pounds since last visit may definitely contribute to this. -She is not checking sugars at home, and I strongly advised her to start.  She does not qualify to get the freestyle libre CGM and she tells me she cannot afford to get it without coverage from insurance.  We discussed about using 1 of the glucose meters that she has and not use the fingers, but alternative sites.  I advised her how to do this. -I offered to refer her to nutrition but she refuses.  She had a nutrition visit in the past. -At this visit, I suggested to start long-acting insulin, in the form of [place].  I hope that this is covered for her.  We will started 20 units and advanced as needed.  She knows how to do injections, as she was on insulin in the past. -I also suggested a low dose Jardiance.  She has mild chronic renal dysfunction so for now I would continue with 10 mg daily.  I explained the cardiovascular benefits of this drug. -I advised her to send me sugars again in 2 weeks and I  will see her back in 1.5 months. - I advised her to: Patient Instructions  Please continue: - Metformin 500 mg 2x a day with meals  Please start: - Jardiance 10 mg before b'fast - Tresiba U200 20 units daily If sugars in am are >140 in 4 days increase to 24 units. If sugars in am are >140 in 4 days increase to 28 units.  Send me the sugars in 2 weeks.  Please return in 1.5 months with your sugar log.   - continue checking sugars at different times of the day - check 2x a day, rotating checks - advised for yearly eye exams >> she is not UTD - Return to clinic in 1.5 mo with sugar log   3.  Obesity - Gained 15 lbs since last visit -Jardiance will help with weight loss, but unfortunately, adding  insulin was not. -Discussed about the need to improve diet.  She stays active by using a stationary bike daily.  Philemon Kingdom, MD PhD [facility]

## 2018-08-31 NOTE — Telephone Encounter (Signed)
Patient contacted regarding discharge from [facility] on 08/30/2018.  Patient understands to follow up with provider Jory Sims on 09/05/18 at 9:00 AM at [facility]. Patient understands discharge instructions? YES Patient understands medications and regiment? YES Patient understands to bring all medications to this visit? YES  ONLY ASKED ABOUT CARE FOR CATH SITE

## 2018-08-31 NOTE — Patient Instructions (Addendum)
Please continue: - Metformin 500 mg 2x a day with meals  Please start: - Jardiance 10 mg before b'fast - Tresiba U200 20 units daily If sugars in am are >140 in 4 days increase to 24 units. If sugars in am are >140 in 4 days increase to 28 units.  Send me the sugars in 2 weeks.  Please return in 1.5 months with your sugar log.

## 2018-09-04 MED ORDER — GLUCOSE BLOOD VI STRP: ORAL_STRIP | 12 refills | Status: AC

## 2018-09-04 MED ORDER — INSULIN GLARGINE (2 UNIT DIAL) 300 UNIT/ML ~~LOC~~ SOPN: 20.0000 [IU] | PEN_INJECTOR | Freq: Every day | SUBCUTANEOUS | 1 refills | Status: DC

## 2018-09-04 NOTE — Progress Notes (Deleted)
Cardiology Office Note   Date:  09/04/2018   ID:  [name], [name] [birth year], MRN [id number]  PCP:  Ann Held, DO  Cardiologist: Dr. Ellyn Hack  No chief complaint on file.    History of Present Illness: [name] is a 68 y.o. female who presents for post hospitalization follow up after admission for STEMI with inferior ST elevation. He ultimately underwent cardiac cath revealing severe 2 vessel CAD with 100% occlusion of the prox-mid-RCA treated with DES, and also 90% Cx-OM with staged PCI. Not sent home on ACE or ARB until renal function could be assessed. He was also started on Crestor 40 mg daily. He was noted to have poorly controlled diabetes, with change in insulin dose.    Past Medical History:  Diagnosis Date  . Asthma   . Degenerative joint disease   . Diabetes mellitus   . Dyspnea   . GERD (gastroesophageal reflux disease)   . Hyperlipidemia    no longer on medication for this  . Hypertension    patient denies  . Myocardial infarction ([place]) 08/2018  . Neuropathy    bilateral hands and feet  . Sickle cell trait ([place])   . Sleep apnea   . stomach ca dx'd 2010   chemo/xrt comp 05/2009  . TIA (transient ischemic attack) 07/2015    Past Surgical History:  Procedure Laterality Date  . APPENDECTOMY    . CESAREAN SECTION    . COLON SURGERY     colonscopy  . COLONOSCOPY WITH PROPOFOL N/A 06/01/2016   Procedure: COLONOSCOPY WITH PROPOFOL;  Surgeon: Wilford Corner, MD;  Location: [facility] ENDOSCOPY;  Service: Endoscopy;  Laterality: N/A;  . CORONARY STENT INTERVENTION N/A 08/29/2018   Procedure: CORONARY STENT INTERVENTION;  Surgeon: Wellington Hampshire, MD;  Location: [place] CV LAB;  Service: Cardiovascular;  Laterality: N/A;  . CORONARY/GRAFT ACUTE MI REVASCULARIZATION N/A 08/27/2018   Procedure: Coronary/Graft Acute MI Revascularization;  Surgeon: Leonie Man, MD;  Location: [place] CV LAB;  Service: Cardiovascular;  Laterality: N/A;  .  ESOPHAGOGASTRODUODENOSCOPY N/A 10/15/2012   Procedure: ESOPHAGOGASTRODUODENOSCOPY (EGD);  Surgeon: Lear Ng, MD;  Location: [facility];  Service: Endoscopy;  Laterality: N/A;  . ESOPHAGOGASTRODUODENOSCOPY N/A 01/15/2016   Procedure: ESOPHAGOGASTRODUODENOSCOPY (EGD);  Surgeon: Ronald Lobo, MD;  Location: [facility] ENDOSCOPY;  Service: Endoscopy;  Laterality: N/A;  . HERNIA REPAIR    . LEFT HEART CATH AND CORONARY ANGIOGRAPHY N/A 08/27/2018   Procedure: LEFT HEART CATH AND CORONARY ANGIOGRAPHY;  Surgeon: Leonie Man, MD;  Location: [place] CV LAB;  Service: Cardiovascular;  Laterality: N/A;  . SHOULDER SURGERY     Left  . TOTAL HIP ARTHROPLASTY     Right  . TOTAL KNEE ARTHROPLASTY     Left     Current Outpatient Medications  Medication Sig Dispense Refill  . albuterol (PROVENTIL HFA;VENTOLIN HFA) 108 (90 Base) MCG/ACT inhaler Inhale 2 puffs into the lungs every 6 (six) hours as needed for wheezing or shortness of breath. 1 Inhaler 2  . albuterol (PROVENTIL) (2.5 MG/3ML) 0.083% nebulizer solution Take 3 mLs (2.5 mg total) by nebulization every 6 (six) hours as needed for wheezing or shortness of breath. 75 mL 12  . aspirin 81 MG tablet Take 81 mg by mouth at bedtime.    . empagliflozin (JARDIANCE) 10 MG TABS tablet Take 10 mg by mouth daily. 30 tablet 3  . fluticasone (FLONASE) 50 MCG/ACT nasal spray Place 2 sprays into both nostrils daily. 16 g  5  . gabapentin (NEURONTIN) 400 MG capsule Take 2 capsules (800 mg total) by mouth 3 (three) times daily. 180 capsule 7  . glimepiride (AMARYL) 4 MG tablet Take 1 tablet (4 mg total) by mouth 2 (two) times daily. (Patient not taking: Reported on 08/31/2018) 180 tablet 1  . Insulin Degludec (TRESIBA FLEXTOUCH) 200 UNIT/ML SOPN Inject 20 Units into the skin daily. 3 pen 3  . insulin NPH-regular Human (NOVOLIN 70/30 RELION) (70-30) 100 UNIT/ML injection Inject 15 Units into the skin 2 (two) times daily with a meal. 10 mL 3  .  ipratropium-albuterol (DUONEB) 0.5-2.5 (3) MG/3ML SOLN Take 3 mLs by nebulization every 6 (six) hours as needed. (Patient taking differently: Take 3 mLs by nebulization every 6 (six) hours as needed (sob and wheezing). ) 360 mL 2  . isosorbide mononitrate (IMDUR) 30 MG 24 hr tablet Take 0.5 tablets (15 mg total) by mouth daily. 30 tablet 6  . metFORMIN (GLUCOPHAGE) 500 MG tablet Take 1 tablet (500 mg total) by mouth 2 (two) times daily with a meal. 180 tablet 3  . metoprolol tartrate (LOPRESSOR) 25 MG tablet Take 1 tablet (25 mg total) by mouth 2 (two) times daily. 60 tablet 6  . Multiple Vitamin (MULTIVITAMIN WITH MINERALS) TABS tablet Take 1 tablet by mouth daily.    . nitroGLYCERIN (NITROSTAT) 0.4 MG SL tablet Place 1 tablet (0.4 mg total) under the tongue every 5 (five) minutes as needed. 25 tablet 12  . pantoprazole (PROTONIX) 40 MG tablet Take 1 tablet (40 mg total) by mouth daily. 30 tablet 3  . rosuvastatin (CRESTOR) 40 MG tablet Take 1 tablet (40 mg total) by mouth at bedtime. 30 tablet 6  . sertraline (ZOLOFT) 50 MG tablet TAKE 1 TABLET BY MOUTH EVERY DAY (Patient taking differently: Take 50 mg by mouth daily. ) 90 tablet 1  . sucralfate (CARAFATE) 1 g tablet Take 1 tablet (1 g total) by mouth 2 (two) times daily. 60 tablet 3  . ticagrelor (BRILINTA) 90 MG TABS tablet Take 1 tablet (90 mg total) by mouth 2 (two) times daily. 60 tablet 11  . traMADol (ULTRAM) 50 MG tablet TAKE 2 TABLETS BY MOUTH 3 TIMES DAILY AS NEEDED FOR MODERATE PAIN (Patient taking differently: Take 100 mg by mouth 3 (three) times daily. ) 180 tablet 0   No current facility-administered medications for this visit.    Facility-Administered Medications Ordered in Other Visits  Medication Dose Route Frequency Provider Last Rate Last Dose  . sodium chloride flush (NS) 0.9 % injection 10 mL  10 mL Intravenous PRN [place], Holli Humbles, NP   10 mL at 09/13/16 1146    Allergies:   Adhesive [tape]; Codeine; Zocor [simvastatin  - high dose]; and Penicillins    Social History:  The patient  reports that she quit smoking about 29 years ago. Her smoking use included cigarettes. She started smoking about 44 years ago. She has a 15.00 pack-year smoking history. She has never used smokeless tobacco. She reports that she does not drink alcohol or use drugs.   Family History:  The patient's family history includes Alzheimer's disease (age of onset: 36) in her father; Cancer in her maternal grandfather; Cancer (age of onset: 29) in her mother; Diabetes in her brother and maternal grandmother.    ROS: All other systems are reviewed and negative. Unless otherwise mentioned in H&P    PHYSICAL EXAM: VS:  There were no vitals taken for this visit. , BMI There is no  height or weight on file to calculate BMI. GEN: Well nourished, well developed, in no acute distress HEENT: normal Neck: no JVD, carotid bruits, or masses Cardiac: ***RRR; no murmurs, rubs, or gallops,no edema  Respiratory:  Clear to auscultation bilaterally, normal work of breathing GI: soft, nontender, nondistended, + BS MS: no deformity or atrophy Skin: warm and dry, no rash Neuro:  Strength and sensation are intact Psych: euthymic mood, full affect   EKG:  EKG {ACTION; IS/IS [id number]} ordered today. The ekg ordered today demonstrates ***   Recent Labs: 11/02/2017: B Natriuretic Peptide 41.6 08/27/2018: ALT 16 08/30/2018: BUN 13; Creatinine, Ser 1.31; Hemoglobin 10.4; Platelets 278; Potassium 4.3; Sodium 139    Lipid Panel    Component Value Date/Time   CHOL 168 08/28/2018 0303   CHOL 196 08/02/2016 1317   TRIG 187 (H) 08/28/2018 0303   TRIG 169 (H) 08/02/2016 1317   HDL 46 08/28/2018 0303   HDL 60 08/02/2016 1317   CHOLHDL 3.7 08/28/2018 0303   VLDL 37 08/28/2018 0303   LDLCALC 85 08/28/2018 0303   LDLCALC 102 (H) 08/02/2016 1317   LDLDIRECT 136.0 08/20/2018 1142      Wt Readings from Last 3 Encounters:  08/31/18 233 lb (105.7 kg)    08/30/18 231 lb 7.7 oz (105 kg)  08/20/18 231 lb (104.8 kg)      Other studies Reviewed: CORONARY STENT INTERVENTION  08/29/18  Conclusion     Previously placed Prox RCA to Mid RCA drug eluting stent is widely patent.  Balloon angioplasty was performed.  Prox RCA lesion is 20% stenosed.  Prox Cx to Mid Cx lesion is 90% stenosed.  Post intervention, there is a 0% residual stenosis.  A drug-eluting stent was successfully placed using a STENT RESOLUTE ONYX [id number].  Successful angioplasty and drug-eluting stent placement to the mid left circumflex via the right radial artery.  Recommendations: Continue dual antiplatelet therapy for at least 1 year. Possible discharge home tomorrow.   Echo 08/28/18 1. The left ventricle has low normal systolic function of 29-51%. The cavity size is normal. There is severely increased left ventricular wall thickness of the septal wall. Normal left ventricular filling pressures. The left ventricular diastology could not be evaluated due to indeterminent diastolic function. There is severe akinesis of the basiliar-mid inferior left ventricular segment. 2. There is mild dilatation of the ascending aorta. 3. Ao Asc diam:3.75 cm. 4. The tricuspid valve is normal in structure. Regurgitation is mild by color flow Doppler. 5. The mitral valve is normal in structure Regurgitation is trivial by color flow Doppler. 6. Compared to prior echo, there is now mid and basal inferior akinesis. The endocardial segments are not well visualized and Definity contrast would be helpful to delineate if this is a new wall motion abnormality.  Coronary/Graft Acute MI Revascularization  08/27/18  LEFT HEART CATH AND CORONARY ANGIOGRAPHY  Conclusion     The left ventricular systolic function is normal.  LV end diastolic pressure is normal.  The left ventricular ejection fraction is greater than 65% by visual estimate.  There is trivial (1+) mitral  regurgitation.  There is no aortic valve stenosis.  Prox RCA to Mid RCA lesion is 100% stenosed.  Post intervention, there is a 0% residual stenosis.  A drug-eluting stent was successfully placed using a [place] [id number].  Prox RCA lesion is 20% stenosed.  Prox Cx lesion is 90% stenosed.  SUMMARY  Severe two-vessel CAD with 100% occlusion of the prox-midRCA (DES PCI  Resolute Onynx DES 2.75 mm x 18 mm - 3.1 mm) and focal 90% Cx-OM (plan staged PCI)  Well-preserved LVEF with mild inferior hypokinesis.  Normal LVEDP.     ASSESSMENT AND PLAN:  1.  ***   Current medicines are reviewed at length with the patient today.    Labs/ tests ordered today include: *** Phill Myron. [place] Pugh, ANP, AACC   09/04/2018 1:05 PM    [facility] [place] [address] Office [phone] Fax [phone]

## 2018-09-04 NOTE — Telephone Encounter (Signed)
RX sent. Patient notified.

## 2018-09-04 NOTE — Telephone Encounter (Signed)
So sorry-- we will see her when she gets out

## 2018-09-04 NOTE — Addendum Note (Signed)
Addended by: Cardell Peach I on: 09/04/2018 02:25 PM   Modules accepted: Orders

## 2018-09-05 ENCOUNTER — Ambulatory Visit: Payer: Medicare Other | Admitting: Adult Health

## 2018-09-06 NOTE — Progress Notes (Signed)
Transitions of Care Follow Up Call Note  [name] is an 68 y.o. female who presented to [facility] on 08/27/2018.  The patient had the following prescriptions filled at [place]: [place]  Patient was called by pharmacist and HIPAA identifiers were verified. The following questions were asked about the prescriptions filled at [place]:  Has the patient been experiencing any side effects to the medications prescribed? no Understanding of regimen: fair Understanding of indications: fair Potential of compliance: fair  Pharmacist comments: Patient reports that she contacted the manufacturer and they were able to get her patient assistance. Informed the patient that she should bring in the co-pay card they sent her to her pharmacy so they can appropriately adjust her co-pay. Patient verbalized understanding.   [x]  Patient's prescriptions filled at the [facility] Transitions of Care Pharmacy were transferred to the following pharmacy: [facility] [place].  []  Patient unable to be reached after calling three times and prescriptions filled at the [facility] Transitions of Care Pharmacy were transferred to preferred pharmacy found within their chart.   [name], PharmD 09/06/2018, 7:43 PM Transitions of Care Pharmacy Hours: Monday - Friday 8:30am to 5:00 PM  Phone - [phone]

## 2018-09-07 ENCOUNTER — Ambulatory Visit (HOSPITAL_BASED_OUTPATIENT_CLINIC_OR_DEPARTMENT_OTHER)
Admission: RE | Admit: 2018-09-07 | Discharge: 2018-09-07 | Disposition: A | Discharge status: Home / Self Care | Payer: Medicare Other | Source: Ambulatory Visit | Attending: Family Medicine | Admitting: Family Medicine

## 2018-09-07 ENCOUNTER — Ambulatory Visit (INDEPENDENT_AMBULATORY_CARE_PROVIDER_SITE_OTHER): Payer: Medicare Other | Admitting: Family Medicine

## 2018-09-07 ENCOUNTER — Encounter: Payer: Self-pay | Admitting: Family Medicine

## 2018-09-07 VITALS — BP 100/70 | HR 75 | Temp 98.4°F | Resp 16 | Ht 66.0 in | Wt 277.6 lb

## 2018-09-07 DIAGNOSIS — R5383 Other fatigue: Secondary | ICD-10-CM | POA: Diagnosis not present

## 2018-09-07 DIAGNOSIS — I25110 Atherosclerotic heart disease of native coronary artery with unstable angina pectoris: Secondary | ICD-10-CM

## 2018-09-07 DIAGNOSIS — R079 Chest pain, unspecified: Secondary | ICD-10-CM | POA: Diagnosis not present

## 2018-09-07 DIAGNOSIS — R002 Palpitations: Secondary | ICD-10-CM | POA: Diagnosis not present

## 2018-09-07 DIAGNOSIS — E559 Vitamin D deficiency, unspecified: Secondary | ICD-10-CM | POA: Diagnosis not present

## 2018-09-07 LAB — COMPREHENSIVE METABOLIC PANEL
ALT: 20 U/L (ref 0–35)
AST: 22 U/L (ref 0–37)
Albumin: 3.9 g/dL (ref 3.5–5.2)
Alkaline Phosphatase: 109 U/L (ref 39–117)
BILIRUBIN TOTAL: 0.4 mg/dL (ref 0.2–1.2)
BUN: 16 mg/dL (ref 6–23)
CO2: 27 meq/L (ref 19–32)
Calcium: 9.6 mg/dL (ref 8.4–10.5)
Chloride: 105 mEq/L (ref 96–112)
Creatinine, Ser: 1.43 mg/dL — ABNORMAL HIGH (ref 0.40–1.20)
GFR: 44.36 mL/min — ABNORMAL LOW (ref >60.00)
Glucose, Bld: 164 mg/dL — ABNORMAL HIGH (ref 70–99)
Potassium: 5 mEq/L (ref 3.5–5.1)
Sodium: 141 mEq/L (ref 135–145)
Total Protein: 7.2 g/dL (ref 6.0–8.3)

## 2018-09-07 LAB — CBC WITH DIFFERENTIAL/PLATELET
Basophils Absolute: 0 10*3/uL (ref 0.0–0.1)
Basophils Relative: 0.5 % (ref 0.0–3.0)
Eosinophils Absolute: 0.3 10*3/uL (ref 0.0–0.7)
Eosinophils Relative: 3.3 % (ref 0.0–5.0)
HCT: 37.9 % (ref 36.0–46.0)
Hemoglobin: 12.4 g/dL (ref 12.0–15.0)
LYMPHS PCT: 25.2 % (ref 12.0–46.0)
Lymphs Abs: 2.2 10*3/uL (ref 0.7–4.0)
MCHC: 32.8 g/dL (ref 30.0–36.0)
MCV: 88.2 fl (ref 78.0–100.0)
Monocytes Absolute: 0.7 10*3/uL (ref 0.1–1.0)
Monocytes Relative: 8.3 % (ref 3.0–12.0)
Neutro Abs: 5.5 10*3/uL (ref 1.4–7.7)
Neutrophils Relative %: 62.7 % (ref 43.0–77.0)
Platelets: 382 10*3/uL (ref 150.0–400.0)
RBC: 4.3 Mil/uL (ref 3.87–5.11)
RDW: 14.1 % (ref 11.5–15.5)
WBC: 8.8 10*3/uL (ref 4.0–10.5)

## 2018-09-07 LAB — VITAMIN B12: Vitamin B-12: 864 pg/mL (ref 211–911)

## 2018-09-07 LAB — TSH: TSH: 3.72 u[IU]/mL (ref 0.35–4.50)

## 2018-09-07 NOTE — Assessment & Plan Note (Signed)
Will check labs but explained to pt this was normal after MI Pt understands

## 2018-09-07 NOTE — Patient Instructions (Signed)
[email or web address]  Or go to company website and look for patient assistance programs      Heart Attack Your heart is a muscle and needs oxygen to survive. A heart attack (myocardial infarction, MI) is a condition that occurs when your heart does not get enough oxygen, and the heart muscle begins to die (ischemia). This can cause permanent damage if not treated right away. A heart attack is a medical emergency. Heart attack is also known as acute coronary syndrome (ACS). ACS is a term used to describe a group of conditions that affect blood flow to the heart. What are the causes? This condition can be caused by:  Atherosclerosis. This is when a fatty substance (plaque) gradually builds up in the blood vessels that flow to the heart (coronaryarteries). This buildup can block or reduce blood supply to one or more of the coronary arteries.  A blood clot. A blood clot can develop suddenly when plaque breaks up (ruptures) within a coronary artery, or it can travel to the heart from another area of the body. The blood clot blocks the artery and prevents blood flow to the heart.  Low blood pressure (hypotension).  An abnormal heart beat (arrhythmia).  Medical conditions that cause a decrease of oxygen to the heart, such as anemiaorrespiratory failure (oxygen mismatch).  Severe tightening (spasm) of a blood vessel that cuts off blood flow to the heart.  Tearing of a coronary artery (spontaneous coronary artery dissection).  Certain heart procedures. What increases the risk? The following factors may make you more likely to develop this condition:  Age. The older a person is, the higher the risk.  Personal or family history of chest pain, heart attack, peripheral artery disease, or stroke.  Being female.  Smoking.  Lack of exercise.  Overweight or obesity.  High blood pressure (hypertension).  High cholesterol.  Diabetes.  Drinking too much alcohol.  Using drugs, such as  cocaine or methamphetamine. What are the signs or symptoms? Symptoms of this condition may vary, depending on factors like gender and age. Symptoms may include:  Chest pain. It may feel like: ? Crushing or squeezing. ? Tightness, pressure, fullness, or heaviness.  Pain in the arm, neck, jaw, back, or upper body.  Shortness of breath.  Heartburn or indigestion.  Nausea.  Sudden cold sweats.  Feeling tired.  Sudden lightheadedness. How is this diagnosed? This condition may be diagnosed through tests, such as:  Electrocardiogram (ECG) to measure the electrical activity of your heart.  Blood tests to check for chemicals released by damaged heart muscle (cardiac markers).  Coronary angiogram to evaluate blood flow and heart function.  CT scan to see the heart more clearly.  Echocardiogram to evaluate heart motion and blood flow. How is this treated? A heart attack must be treated as soon as possible. Treatment may include:  Medicines to: ? Break up or dissolve blood clots (fibrinolytic therapy). ? Thin blood and to help prevent blood clots. ? Treat blood pressure. ? Improve blood flow to the heart. ? Reduce pain. ? Reduce cholesterol.  Procedures to widen a blocked artery and keep it open (angioplasty and stent placement).  Open heart surgery (coronary artery bypass graft, CABG). This enables blood to flow to the heart by going around (bypassing) the damaged part of the artery.  Oxygen therapy if needed.  Cardiac rehabilitation. This improves your health and well-being through exercise training, education, and counseling. Follow these instructions at home: Medicines  Take over-the-counter and prescription medicines  only as told by your health care provider.  Do not take the following medicines unless your health care provider says it is okay to take them: ? NSAIDs. ? Supplements that contain vitamin A, vitamin E, or both. ? Hormone replacement therapy that  contains estrogen with or without progestin. Lifestyle  Do not use any products that contain nicotine or tobacco, such as cigarettes and e-cigarettes. If you need help quitting, ask your health care provider.  Avoid secondhand smoke.  Exercise regularly. Ask your health care provider about participating in a cardiac rehabilitation program that helps you start exercising safely after a heart attack.  Eat a heart-healthy diet. Your health care provider will tell you what foods to eat.  Maintain a healthy weight.  Learn ways to manage stress.  Do not use illegal drugs. Alcohol use  Do not drink alcohol if: ? Your health care provider tells you not to drink. ? You are pregnant, may be pregnant, or are planning to become pregnant.  If you drink alcohol, limit how much you have: ? 0-1 drink a day for women. ? 0-2 drinks a day for men.  Be aware of how much alcohol is in your drink. In the [place] one drink equals one typical bottle of beer (12 oz), one-half glass of wine (5 oz), or one shot of hard liquor (1 oz). General instructions  Work with your health care provider to manage any other conditions you have, such as hypertension or diabetes. These conditions affect your heart.  Get screened for depression, and seek treatment if needed.  Keep your vaccinations up to date. Get the flu (influenza) vaccine every year.  Keep all follow-up visits as told by your health care provider. This is important. Contact a health care provider if:  You feel overwhelmed or sad.  You have trouble doing your daily activities. Get help right away if you:  Have sudden, unexplained discomfort in your chest, arms, back, neck, jaw, or upper body.  Have shortness of breath.  Suddenly start to sweat or your skin gets clammy.  Feel nauseous or vomit.  Have unexplained fatigue or weakness.  Suddenly feel lightheaded or dizzy.  Notice your heart starts to beat fast or feels like it is skipping  beats.  Have blood pressure that is higher than 180/120. These symptoms may represent a serious problem that is an emergency. Do not wait to see if the symptoms will go away. Get medical help right away. Call your local emergency services (911 in the [place] Do not drive yourself to the hospital. Summary  A heart attack (myocardial infarction, MI) is a condition that occurs when an artery in the heart (coronary artery) becomes narrowed or blocked. The narrowing or blockage cuts off the blood and oxygen supply to the heart, which can permanently damage the heart.  A heart attack is an emergency. Get help right away if you have sudden pain in your chest, arms, back, jaw, or upper body. Seek help if you have nausea or you vomit, or you become lightheaded or dizzy.  Treatment is a combination of medicines and procedures, if needed, to open the blocked artery and restore blood flow to the heart. This information is not intended to replace advice given to you by your health care provider. Make sure you discuss any questions you have with your health care provider. Document Released: 07/11/2005 Document Revised: 08/25/2017 Document Reviewed: 08/25/2017 Elsevier Interactive Patient Education  2019 [employer].

## 2018-09-07 NOTE — Assessment & Plan Note (Signed)
Not new but ongoing Pt has appointment with cardiology Monday Cardiac monitor ordered

## 2018-09-07 NOTE — Assessment & Plan Note (Addendum)
Per cardiology con't meds Pt instructed to go to ER if chest pain returns

## 2018-09-07 NOTE — Progress Notes (Signed)
Patient ID: [name], female    DOB: January 31, 1951  Age: 68 y.o. MRN: [id number]    Subjective:  Subjective  HPI [name] presents for f/u hosp for MI.  She has had no more chest pain but still has some palpitations and sob.  She also c/o fatigue since being in the hospital  Review of Systems  Constitutional: Positive for fatigue. Negative for appetite change, chills, diaphoresis, fever and unexpected weight change.  HENT: Negative for congestion and hearing loss.   Eyes: Negative for pain, discharge, redness and visual disturbance.  Respiratory: Negative for cough, chest tightness, shortness of breath and wheezing.   Cardiovascular: Positive for palpitations. Negative for chest pain and leg swelling.  Gastrointestinal: Negative for abdominal pain, blood in stool, constipation, diarrhea, nausea and vomiting.  Endocrine: Negative for cold intolerance, heat intolerance, polydipsia, polyphagia and polyuria.  Genitourinary: Negative for difficulty urinating, dysuria, frequency, hematuria and urgency.  Musculoskeletal: Negative for back pain and myalgias.  Skin: Negative for rash.  Allergic/Immunologic: Negative for environmental allergies.  Neurological: Negative for dizziness, weakness, light-headedness, numbness and headaches.  Hematological: Does not bruise/bleed easily.  Psychiatric/Behavioral: Negative for suicidal ideas. The patient is not nervous/anxious.     History Past Medical History:  Diagnosis Date  . Asthma   . Degenerative joint disease   . Diabetes mellitus   . Dyspnea   . GERD (gastroesophageal reflux disease)   . Hyperlipidemia    no longer on medication for this  . Hypertension    patient denies  . Myocardial infarction ([place]) 08/2018  . Neuropathy    bilateral hands and feet  . Sickle cell trait ([place])   . Sleep apnea   . stomach ca dx'd 2010   chemo/xrt comp 05/2009  . TIA (transient ischemic attack) 07/2015    She has a past surgical history that  includes Total knee arthroplasty; Total hip arthroplasty; Shoulder surgery; Appendectomy; Cesarean section; Hernia repair; Esophagogastroduodenoscopy (N/A, 10/15/2012); Esophagogastroduodenoscopy (N/A, 01/15/2016); Colon surgery; Colonoscopy with propofol (N/A, 06/01/2016); Coronary/Graft Acute MI Revascularization (N/A, 08/27/2018); LEFT HEART CATH AND CORONARY ANGIOGRAPHY (N/A, 08/27/2018); and CORONARY STENT INTERVENTION (N/A, 08/29/2018).   Her family history includes Alzheimer's disease (age of onset: 54) in her father; Cancer in her maternal grandfather; Cancer (age of onset: 45) in her mother; Diabetes in her brother and maternal grandmother.She reports that she quit smoking about 29 years ago. Her smoking use included cigarettes. She started smoking about 44 years ago. She has a 15.00 pack-year smoking history. She has never used smokeless tobacco. She reports that she does not drink alcohol or use drugs.  Current Outpatient Medications on File Prior to Visit  Medication Sig Dispense Refill  . albuterol (PROVENTIL HFA;VENTOLIN HFA) 108 (90 Base) MCG/ACT inhaler Inhale 2 puffs into the lungs every 6 (six) hours as needed for wheezing or shortness of breath. 1 Inhaler 2  . albuterol (PROVENTIL) (2.5 MG/3ML) 0.083% nebulizer solution Take 3 mLs (2.5 mg total) by nebulization every 6 (six) hours as needed for wheezing or shortness of breath. 75 mL 12  . aspirin 81 MG tablet Take 81 mg by mouth at bedtime.    . empagliflozin (JARDIANCE) 10 MG TABS tablet Take 10 mg by mouth daily. 30 tablet 3  . fluticasone (FLONASE) 50 MCG/ACT nasal spray Place 2 sprays into both nostrils daily. 16 g 5  . gabapentin (NEURONTIN) 400 MG capsule Take 2 capsules (800 mg total) by mouth 3 (three) times daily. 180 capsule 7  . glimepiride (  AMARYL) 4 MG tablet Take 1 tablet (4 mg total) by mouth 2 (two) times daily. 180 tablet 1  . glucose blood (ONETOUCH VERIO) test strip Use as instructed to check blood sugar 3 times a day 300  each 12  . ipratropium-albuterol (DUONEB) 0.5-2.5 (3) MG/3ML SOLN Take 3 mLs by nebulization every 6 (six) hours as needed. (Patient taking differently: Take 3 mLs by nebulization every 6 (six) hours as needed (sob and wheezing). ) 360 mL 2  . isosorbide mononitrate (IMDUR) 30 MG 24 hr tablet Take 0.5 tablets (15 mg total) by mouth daily. 30 tablet 6  . metFORMIN (GLUCOPHAGE) 500 MG tablet Take 1 tablet (500 mg total) by mouth 2 (two) times daily with a meal. 180 tablet 3  . metoprolol tartrate (LOPRESSOR) 25 MG tablet Take 1 tablet (25 mg total) by mouth 2 (two) times daily. 60 tablet 6  . Multiple Vitamin (MULTIVITAMIN WITH MINERALS) TABS tablet Take 1 tablet by mouth daily.    . nitroGLYCERIN (NITROSTAT) 0.4 MG SL tablet Place 1 tablet (0.4 mg total) under the tongue every 5 (five) minutes as needed. 25 tablet 12  . pantoprazole (PROTONIX) 40 MG tablet Take 1 tablet (40 mg total) by mouth daily. 30 tablet 3  . rosuvastatin (CRESTOR) 40 MG tablet Take 1 tablet (40 mg total) by mouth at bedtime. 30 tablet 6  . sertraline (ZOLOFT) 50 MG tablet TAKE 1 TABLET BY MOUTH EVERY DAY (Patient taking differently: Take 50 mg by mouth daily. ) 90 tablet 1  . sucralfate (CARAFATE) 1 g tablet Take 1 tablet (1 g total) by mouth 2 (two) times daily. 60 tablet 3  . ticagrelor (BRILINTA) 90 MG TABS tablet Take 1 tablet (90 mg total) by mouth 2 (two) times daily. 60 tablet 11  . traMADol (ULTRAM) 50 MG tablet TAKE 2 TABLETS BY MOUTH 3 TIMES DAILY AS NEEDED FOR MODERATE PAIN (Patient taking differently: Take 100 mg by mouth 3 (three) times daily. ) 180 tablet 0  . Insulin Degludec (TRESIBA FLEXTOUCH) 200 UNIT/ML SOPN Inject 20 Units into the skin daily. (Patient not taking: Reported on 09/07/2018) 3 pen 3   Current Facility-Administered Medications on File Prior to Visit  Medication Dose Route Frequency Provider Last Rate Last Dose  . sodium chloride flush (NS) 0.9 % injection 10 mL  10 mL Intravenous PRN [place],  Holli Humbles, NP   10 mL at 09/13/16 1146     Objective:  Objective  Physical Exam Vitals signs and nursing note reviewed.  Constitutional:      Appearance: She is well-developed.  HENT:     Head: Normocephalic and atraumatic.  Eyes:     Conjunctiva/sclera: Conjunctivae normal.  Neck:     Musculoskeletal: Normal range of motion and neck supple.     Thyroid: No thyromegaly.     Vascular: No carotid bruit or JVD.  Cardiovascular:     Rate and Rhythm: Normal rate and regular rhythm.     Heart sounds: Normal heart sounds. No murmur.  Pulmonary:     Effort: Pulmonary effort is normal. No respiratory distress.     Breath sounds: Normal breath sounds. No wheezing or rales.  Chest:     Chest wall: No tenderness.  Neurological:     Mental Status: She is alert and oriented to person, place, and time.    BP 100/70 (BP Location: Right Arm, Cuff Size: Large)   Pulse 75   Temp 98.4 F (36.9 C) (Oral)   Resp 16  Ht 5' 6"  (1.676 m)   Wt 277 lb 9.6 oz (125.9 kg)   SpO2 97%   BMI 44.81 kg/m  Wt Readings from Last 3 Encounters:  09/07/18 277 lb 9.6 oz (125.9 kg)  08/31/18 233 lb (105.7 kg)  08/30/18 231 lb 7.7 oz (105 kg)     Lab Results  Component Value Date   WBC 8.5 08/30/2018   HGB 10.4 (L) 08/30/2018   HCT 32.4 (L) 08/30/2018   PLT 278 08/30/2018   GLUCOSE 205 (H) 08/30/2018   CHOL 168 08/28/2018   TRIG 187 (H) 08/28/2018   HDL 46 08/28/2018   LDLDIRECT 136.0 08/20/2018   LDLCALC 85 08/28/2018   ALT 16 08/27/2018   AST 19 08/27/2018   NA 139 08/30/2018   K 4.3 08/30/2018   CL 107 08/30/2018   CREATININE 1.31 (H) 08/30/2018   BUN 13 08/30/2018   CO2 22 08/30/2018   TSH 3.061 05/13/2016   INR 1.04 08/27/2018   HGBA1C 13.0 (H) 08/28/2018   MICROALBUR <0.7 08/20/2018  ekg ---no change from previous ekg in hospital  No results found.   Assessment & Plan:  Plan  I have discontinued Leane Platt. Leibold's insulin NPH-regular Human and Insulin Glargine (2 Unit Dial). I am  also having her maintain her multivitamin with minerals, aspirin, ipratropium-albuterol, gabapentin, albuterol, albuterol, metFORMIN, glimepiride, fluticasone, sertraline, sucralfate, traMADol, pantoprazole, isosorbide mononitrate, metoprolol tartrate, ticagrelor, nitroGLYCERIN, rosuvastatin, empagliflozin, Insulin Degludec, and glucose blood.  No orders of the defined types were placed in this encounter.   Problem List Items Addressed This Visit      Unprioritized   Chest pain - Primary    If cp returns---  Go to ER       Relevant Orders   EKG 12-Lead (Completed)   CBC with Differential/Platelet   Comprehensive metabolic panel   TSH   Vitamin B12   Vitamin D 1,25 dihydroxy   DG Chest 2 View   Coronary artery disease involving native coronary artery of native heart with unstable angina pectoris [facility])    Per cardiology con't meds Pt instructed to go to ER if chest pain returns      Other fatigue    Will check labs but explained to pt this was normal after MI Pt understands      Relevant Orders   CBC with Differential/Platelet   Comprehensive metabolic panel   TSH   Vitamin B12   Vitamin D 1,25 dihydroxy   Palpitations    Not new but ongoing Pt has appointment with cardiology Monday Cardiac monitor ordered       Relevant Orders   Cardiac event monitor   CBC with Differential/Platelet   Comprehensive metabolic panel   TSH   Vitamin B12   Vitamin D 1,25 dihydroxy   DG Chest 2 View      Follow-up: Return in about 3 months (around 12/06/2018) for hypertension, hyperlipidemia, diabetes II.  Ann Held, DO

## 2018-09-07 NOTE — Assessment & Plan Note (Signed)
If cp returns---  Go to ER

## 2018-09-08 NOTE — Progress Notes (Signed)
Cardiology Office Note   Date:  09/10/2018   ID:  Jessica, Jefferson 03-03-1951, MRN [id number]  PCP:  Ann Held, DO  Cardiologist:  Dr. Ellyn Hack  Chief Complaint  Patient presents with  . Follow-up  . Coronary Artery Disease     History of Present Illness: [name] is a 68 y.o. female who presents for post hospital follow up after admission for STEMI of the inferior wall after seen in ED for substernal chest pain with EKG demonstrating inferior ST elevations. She has other history of HTN, Type II diabetes, HL and Obesity.   She was taken emergently to cardiac cath lab which revealed two vessel CAD with 100% occlusion of the proxiimal-mid RCA treated with a DES and focal 90% Cx-OM treated with a staged PCI with DES on 08/29/2018.  She was placed on DAPT with Brilinta and ASA, BB, Imdur and statin with consideration of starting ACE/ARB on OP follow up as she had AKI on admission with discharge creatinine of 1.31.   She was also noted to have poorly controlled diabetes, with Hgb A1c of >14. She was changed to Novolin 70/30 and was to follow up with PCP.  She comes today with worsening dyspnea. She states that she did not have it as bad prior to her recent hospitalization. She also complains of frequent palpitations that occur daily. She is medically compliant. She denies recurrent chest pain. Blood sugar is getting better.   Past Medical History:  Diagnosis Date  . Asthma   . Degenerative joint disease   . Diabetes mellitus   . Dyspnea   . GERD (gastroesophageal reflux disease)   . Hyperlipidemia    no longer on medication for this  . Hypertension    patient denies  . Myocardial infarction ([place]) 08/2018  . Neuropathy    bilateral hands and feet  . Sickle cell trait ([place])   . Sleep apnea   . stomach ca dx'd 2010   chemo/xrt comp 05/2009  . TIA (transient ischemic attack) 07/2015    Past Surgical History:  Procedure Laterality Date  . APPENDECTOMY    . CESAREAN  SECTION    . COLON SURGERY     colonscopy  . COLONOSCOPY WITH PROPOFOL N/A 06/01/2016   Procedure: COLONOSCOPY WITH PROPOFOL;  Surgeon: Wilford Corner, MD;  Location: [facility] ENDOSCOPY;  Service: Endoscopy;  Laterality: N/A;  . CORONARY STENT INTERVENTION N/A 08/29/2018   Procedure: CORONARY STENT INTERVENTION;  Surgeon: Wellington Hampshire, MD;  Location: [place] CV LAB;  Service: Cardiovascular;  Laterality: N/A;  . CORONARY/GRAFT ACUTE MI REVASCULARIZATION N/A 08/27/2018   Procedure: Coronary/Graft Acute MI Revascularization;  Surgeon: Leonie Man, MD;  Location: [facility] [facility] CV LAB;  Service: Cardiovascular;  Laterality: N/A;  . ESOPHAGOGASTRODUODENOSCOPY N/A 10/15/2012   Procedure: ESOPHAGOGASTRODUODENOSCOPY (EGD);  Surgeon: Lear Ng, MD;  Location: [facility] ENDOSCOPY;  Service: Endoscopy;  Laterality: N/A;  . ESOPHAGOGASTRODUODENOSCOPY N/A 01/15/2016   Procedure: ESOPHAGOGASTRODUODENOSCOPY (EGD);  Surgeon: Ronald Lobo, MD;  Location: [facility] ENDOSCOPY;  Service: Endoscopy;  Laterality: N/A;  . HERNIA REPAIR    . LEFT HEART CATH AND CORONARY ANGIOGRAPHY N/A 08/27/2018   Procedure: LEFT HEART CATH AND CORONARY ANGIOGRAPHY;  Surgeon: Leonie Man, MD;  Location: [place] CV LAB;  Service: Cardiovascular;  Laterality: N/A;  . SHOULDER SURGERY     Left  . TOTAL HIP ARTHROPLASTY     Right  . TOTAL KNEE ARTHROPLASTY     Left  Current Outpatient Medications  Medication Sig Dispense Refill  . albuterol (PROVENTIL HFA;VENTOLIN HFA) 108 (90 Base) MCG/ACT inhaler Inhale 2 puffs into the lungs every 6 (six) hours as needed for wheezing or shortness of breath. 1 Inhaler 2  . albuterol (PROVENTIL) (2.5 MG/3ML) 0.083% nebulizer solution Take 3 mLs (2.5 mg total) by nebulization every 6 (six) hours as needed for wheezing or shortness of breath. 75 mL 12  . aspirin 81 MG tablet Take 81 mg by mouth at bedtime.    . empagliflozin (JARDIANCE) 10 MG TABS tablet Take 10 mg by mouth daily. 30  tablet 3  . fluticasone (FLONASE) 50 MCG/ACT nasal spray Place 2 sprays into both nostrils daily. 16 g 5  . gabapentin (NEURONTIN) 400 MG capsule Take 2 capsules (800 mg total) by mouth 3 (three) times daily. 180 capsule 7  . glimepiride (AMARYL) 4 MG tablet Take 1 tablet (4 mg total) by mouth 2 (two) times daily. 180 tablet 1  . glucose blood (ONETOUCH VERIO) test strip Use as instructed to check blood sugar 3 times a day 300 each 12  . Insulin Degludec (TRESIBA FLEXTOUCH) 200 UNIT/ML SOPN Inject 20 Units into the skin daily. 3 pen 3  . ipratropium-albuterol (DUONEB) 0.5-2.5 (3) MG/3ML SOLN Take 3 mLs by nebulization every 6 (six) hours as needed. (Patient taking differently: Take 3 mLs by nebulization every 6 (six) hours as needed (sob and wheezing). ) 360 mL 2  . isosorbide mononitrate (IMDUR) 30 MG 24 hr tablet Take 0.5 tablets (15 mg total) by mouth daily. 30 tablet 6  . metFORMIN (GLUCOPHAGE) 500 MG tablet Take 1 tablet (500 mg total) by mouth 2 (two) times daily with a meal. 180 tablet 3  . metoprolol tartrate (LOPRESSOR) 25 MG tablet Take 1 tablet (25 mg total) by mouth 2 (two) times daily. 60 tablet 6  . Multiple Vitamin (MULTIVITAMIN WITH MINERALS) TABS tablet Take 1 tablet by mouth daily.    . nitroGLYCERIN (NITROSTAT) 0.4 MG SL tablet Place 1 tablet (0.4 mg total) under the tongue every 5 (five) minutes as needed. 25 tablet 12  . pantoprazole (PROTONIX) 40 MG tablet Take 1 tablet (40 mg total) by mouth daily. 30 tablet 3  . rosuvastatin (CRESTOR) 40 MG tablet Take 1 tablet (40 mg total) by mouth at bedtime. 30 tablet 6  . sertraline (ZOLOFT) 50 MG tablet TAKE 1 TABLET BY MOUTH EVERY DAY (Patient taking differently: Take 50 mg by mouth daily. ) 90 tablet 1  . sucralfate (CARAFATE) 1 g tablet Take 1 tablet (1 g total) by mouth 2 (two) times daily. 60 tablet 3  . ticagrelor (BRILINTA) 90 MG TABS tablet Take 1 tablet (90 mg total) by mouth 2 (two) times daily. 60 tablet 11  . traMADol  (ULTRAM) 50 MG tablet TAKE 2 TABLETS BY MOUTH 3 TIMES DAILY AS NEEDED FOR MODERATE PAIN (Patient taking differently: Take 100 mg by mouth 3 (three) times daily. ) 180 tablet 0   No current facility-administered medications for this visit.    Facility-Administered Medications Ordered in Other Visits  Medication Dose Route Frequency Provider Last Rate Last Dose  . sodium chloride flush (NS) 0.9 % injection 10 mL  10 mL Intravenous PRN [place], Holli Humbles, NP   10 mL at 09/13/16 1146    Allergies:   Adhesive [tape]; Codeine; Zocor [simvastatin - high dose]; and Penicillins    Social History:  The patient  reports that she quit smoking about 29 years ago. Her smoking  use included cigarettes. She started smoking about 44 years ago. She has a 15.00 pack-year smoking history. She has never used smokeless tobacco. She reports that she does not drink alcohol or use drugs.   Family History:  The patient's family history includes Alzheimer's disease (age of onset: 46) in her father; Cancer in her maternal grandfather; Cancer (age of onset: 84) in her mother; Diabetes in her brother and maternal grandmother.    ROS: All other systems are reviewed and negative. Unless otherwise mentioned in H&P    PHYSICAL EXAM: VS:  BP 110/72   Pulse 88   Ht 5' 6"  (1.676 m)   Wt 223 lb (101.2 kg)   SpO2 94%   BMI 35.99 kg/m  , BMI Body mass index is 35.99 kg/m. GEN: Well nourished, well developed, in no acute distress HEENT: normal Neck: no JVD, carotid bruits, or masses Cardiac: RRR; no murmurs, rubs, or gallops,no edema  Respiratory:  Clear to auscultation bilaterally, normal work of breathing GI: soft, nontender, nondistended, + BS MS: no deformity or atrophy Skin: warm and dry, no rash Neuro:  Strength and sensation are intact Psych: euthymic mood, full affect   EKG:  NSR rate of 81 bpm with inferior infarct.   Recent Labs: 11/02/2017: B Natriuretic Peptide 41.6 09/07/2018: ALT 20; BUN 16;  Creatinine, Ser 1.43; Hemoglobin 12.4; Platelets 382.0; Potassium 5.0; Sodium 141; TSH 3.72    Lipid Panel    Component Value Date/Time   CHOL 168 08/28/2018 0303   CHOL 196 08/02/2016 1317   TRIG 187 (H) 08/28/2018 0303   TRIG 169 (H) 08/02/2016 1317   HDL 46 08/28/2018 0303   HDL 60 08/02/2016 1317   CHOLHDL 3.7 08/28/2018 0303   VLDL 37 08/28/2018 0303   LDLCALC 85 08/28/2018 0303   LDLCALC 102 (H) 08/02/2016 1317   LDLDIRECT 136.0 08/20/2018 1142      Wt Readings from Last 3 Encounters:  09/10/18 223 lb (101.2 kg)  09/07/18 277 lb 9.6 oz (125.9 kg)  08/31/18 233 lb (105.7 kg)      Other studies Reviewed: Cardiac cath 08/27/2018    The left ventricular systolic function is normal.  LV end diastolic pressure is normal.  The left ventricular ejection fraction is greater than 65% by visual estimate.  There is trivial (1+) mitral regurgitation.  There is no aortic valve stenosis.  Prox RCA to Mid RCA lesion is 100% stenosed.  Post intervention, there is a 0% residual stenosis.  A drug-eluting stent was successfully placed using a [place] [id number].  Prox RCA lesion is 20% stenosed.  Prox Cx lesion is 90% stenosed.   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  Normal LVEDP.  RECOMMENDATIONS  Admit overnight into the CVICU --run Aggrastat for 6 hours or until bottle complete, then start IV heparin 8 hours after TR band removal for existing circumflex CAD.  With reactive airway disease/COPD medications listed, will start low-dose metoprolol tartrate (titrate as possible).  Anticipate starting ACE inhibitor or ARB following stage PCI.  Plan staged PCI of Cx-OM on February 5  Increase rosuvastatin dose to 40 mg, check fasting lipid panel.  Close diabetes control (check A1c)      CORONARY STENT INTERVENTION  08/29/18  Conclusion     Previously placed Prox RCA to Mid RCA  drug eluting stent is widely patent.  Balloon angioplasty was performed.  Prox RCA lesion is 20% stenosed.  Prox Cx to Mid Cx lesion is 90% stenosed.  Post intervention, there is a 0% residual stenosis.  A drug-eluting stent was successfully placed using a STENT RESOLUTE ONYX 2.5X18.  Successful angioplasty and drug-eluting stent placement to the mid left circumflex via the right radial artery.  Recommendations: Continue dual antiplatelet therapy for at least 1 year. Possible discharge home tomorrow.   Echo 08/28/18 1. The left ventricle has low normal systolic function of 25-49%. The cavity size is normal. There is severely increased left ventricular wall thickness of the septal wall. Normal left ventricular filling pressures. The left ventricular diastology could not be evaluated due to indeterminent diastolic function. There is severe akinesis of the basiliar-mid inferior left ventricular segment. 2. There is mild dilatation of the ascending aorta. 3. Ao Asc diam:3.75 cm. 4. The tricuspid valve is normal in structure. Regurgitation is mild by color flow Doppler. 5. The mitral valve is normal in structure Regurgitation is trivial by color flow Doppler. 6. Compared to prior echo, there is now mid and basal inferior akinesis. The endocardial segments are not well visualized and Definity contrast would be helpful to delineate if this is a new wall motion abnormality.  Coronary/Graft Acute MI Revascularization  08/27/18  LEFT HEART CATH AND CORONARY ANGIOGRAPHY  Conclusion     The left ventricular systolic function is normal.  LV end diastolic pressure is normal.  The left ventricular ejection fraction is greater than 65% by visual estimate.  There is trivial (1+) mitral regurgitation.  There is no aortic valve stenosis.  Prox RCA to Mid RCA lesion is 100%  stenosed.  Post intervention, there is a 0% residual stenosis.  A drug-eluting stent was successfully placed using a [place] [id number].  Prox RCA lesion is 20% stenosed.  Prox Cx lesion is 90% stenosed.  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  Normal LVEDP.     ASSESSMENT AND PLAN:  1. CAD: Severe 2 vessel disease with emergent stent to the RCA and staged PCI of the mid Cx. She is feeling better concerning chest discomfort but is complaining of worsening DOE after hospitalization.  I am concerned this is related to the [place]. She states after taking it, she find is hard to breath,    I have recommended a small amount of caffeine when this happens. She states that she does not drink any caffeine and would prefer not to start. Therefore, I will change her Brilinta to Plavix. She will receive a loading dose of 300 mg the first day and then take 75 mg daily thereafter.   Once she feels better, I recommend that she consider attending Cardiac Rehab or begin a walking program.   2. Hypertension: BP is controlled today.   3. Hypercholesterolemia: She is on rosuvastatin 40 mg daily. Will need to have follow up labs drawn on next office visit in one month.   4. Morbid obesity: Weight loss and improved eating habits to include adherence to a low cholesterol diabetic diet is recommended.   5. Palpitations: Will place a 48 hr Holter monitor.   Current medicines are reviewed at length with the patient today.    Labs/ tests ordered today include: None  Phill Myron. West Pugh, ANP, AACC   09/10/2018 12:10 PM    [facility] [place] [address] Office [phone] Fax [phone]

## 2018-09-09 DIAGNOSIS — R000 Tachycardia, unspecified: Secondary | ICD-10-CM | POA: Diagnosis not present

## 2018-09-09 DIAGNOSIS — R002 Palpitations: Secondary | ICD-10-CM | POA: Diagnosis not present

## 2018-09-10 ENCOUNTER — Encounter: Payer: Self-pay | Admitting: Adult Health

## 2018-09-10 ENCOUNTER — Telehealth: Payer: Self-pay | Admitting: *Deleted

## 2018-09-10 ENCOUNTER — Other Ambulatory Visit: Payer: Self-pay

## 2018-09-10 ENCOUNTER — Ambulatory Visit (INDEPENDENT_AMBULATORY_CARE_PROVIDER_SITE_OTHER): Payer: Medicare Other | Admitting: Adult Health

## 2018-09-10 VITALS — BP 110/72 | HR 88 | Ht 66.0 in | Wt 223.0 lb

## 2018-09-10 DIAGNOSIS — R000 Tachycardia, unspecified: Secondary | ICD-10-CM

## 2018-09-10 DIAGNOSIS — E7800 Pure hypercholesterolemia, unspecified: Secondary | ICD-10-CM

## 2018-09-10 DIAGNOSIS — I2510 Atherosclerotic heart disease of native coronary artery without angina pectoris: Secondary | ICD-10-CM | POA: Diagnosis not present

## 2018-09-10 DIAGNOSIS — I10 Essential (primary) hypertension: Secondary | ICD-10-CM

## 2018-09-10 DIAGNOSIS — R002 Palpitations: Secondary | ICD-10-CM

## 2018-09-10 MED ORDER — CLOPIDOGREL BISULFATE 75 MG PO TABS: 75.0000 mg | ORAL_TABLET | Freq: Every day | ORAL | 6 refills | Status: DC

## 2018-09-10 NOTE — Addendum Note (Signed)
Addended by: Waylan Rocher on: 09/10/2018 02:02 PM   Modules accepted: Orders

## 2018-09-10 NOTE — Patient Instructions (Signed)
Medication Instructions:  If you need a refill on your cardiac medications before your next appointment, please call your pharmacy.  Labwork: NONE When you have your labs (blood work) drawn today and your tests are completely normal, you will receive your results only by MyChart Message (if you have MyChart) -OR-  A paper copy in the mail.  If you have any lab test that is abnormal or we need to change your treatment, we will call you to review these results.  Testing/Procedures: Your physician has recommended that you wear a 48 HOUR holter monitor. Holter monitors are medical devices that record the heart's electrical activity. Doctors most often use these monitors to diagnose arrhythmias. Arrhythmias are problems with the speed or rhythm of the heartbeat. The monitor is a small, portable device. You can wear one while you do your normal daily activities. This is usually used to diagnose what is causing palpitations/syncope (passing out).  Follow-Up: You will need a follow up appointment in 1 months WITH  Glenetta Hew, MD -McGrath, DNP, AACC or one of the following Advanced Practice Providers on your designated Care Team:  Rosaria Ferries, [place]sg[place], DNP, AACC   At [facility], you and your health needs are our priority.  As part of our continuing mission to provide you with exceptional heart care, we have created designated Provider Care Teams.  These Care Teams include your primary Cardiologist (physician) and Advanced Practice Providers (APPs -  Physician Assistants and Nurse Practitioners) who all work together to provide you with the care you need, when you need it.  Thank you for choosing [facility] at [facility]!!

## 2018-09-10 NOTE — Telephone Encounter (Signed)
Patient left a msg on the refill vm stating that she was supposed to start clopidogrel. She would like to know when she should start this medication as she went to the pharmacy but they informed her that they didn't receive an order. Patient would like a call back at [phone] when this has been addressed. Thanks, MI

## 2018-09-11 IMAGING — DX DG CHEST 2V
2 series · 2 of 2 positions shown · non-contrast
Comparison: 12/17/2016

CLINICAL DATA: Asthma, flu, cough

EXAM:
CHEST  2 VIEW

[chest pa]
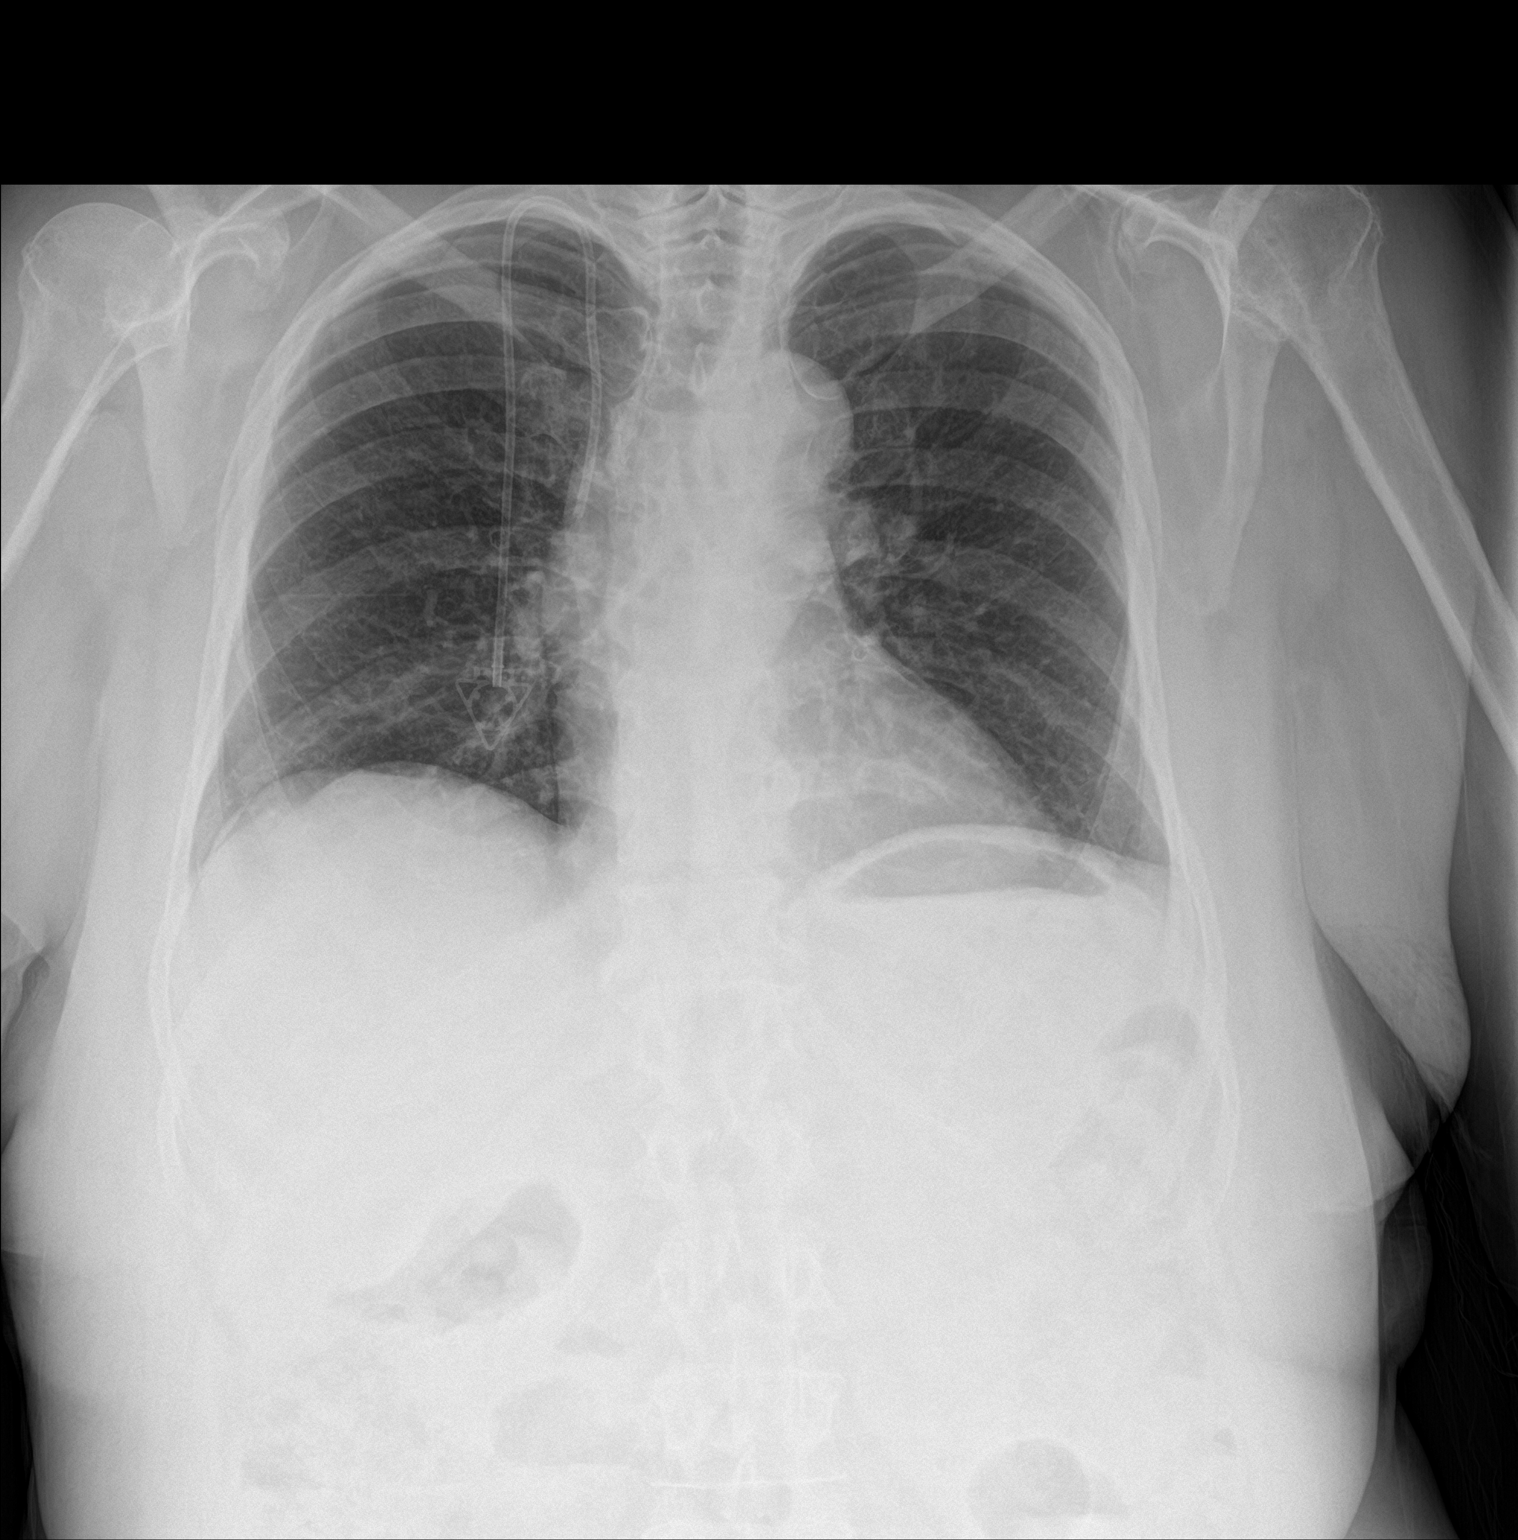

[chest lat]
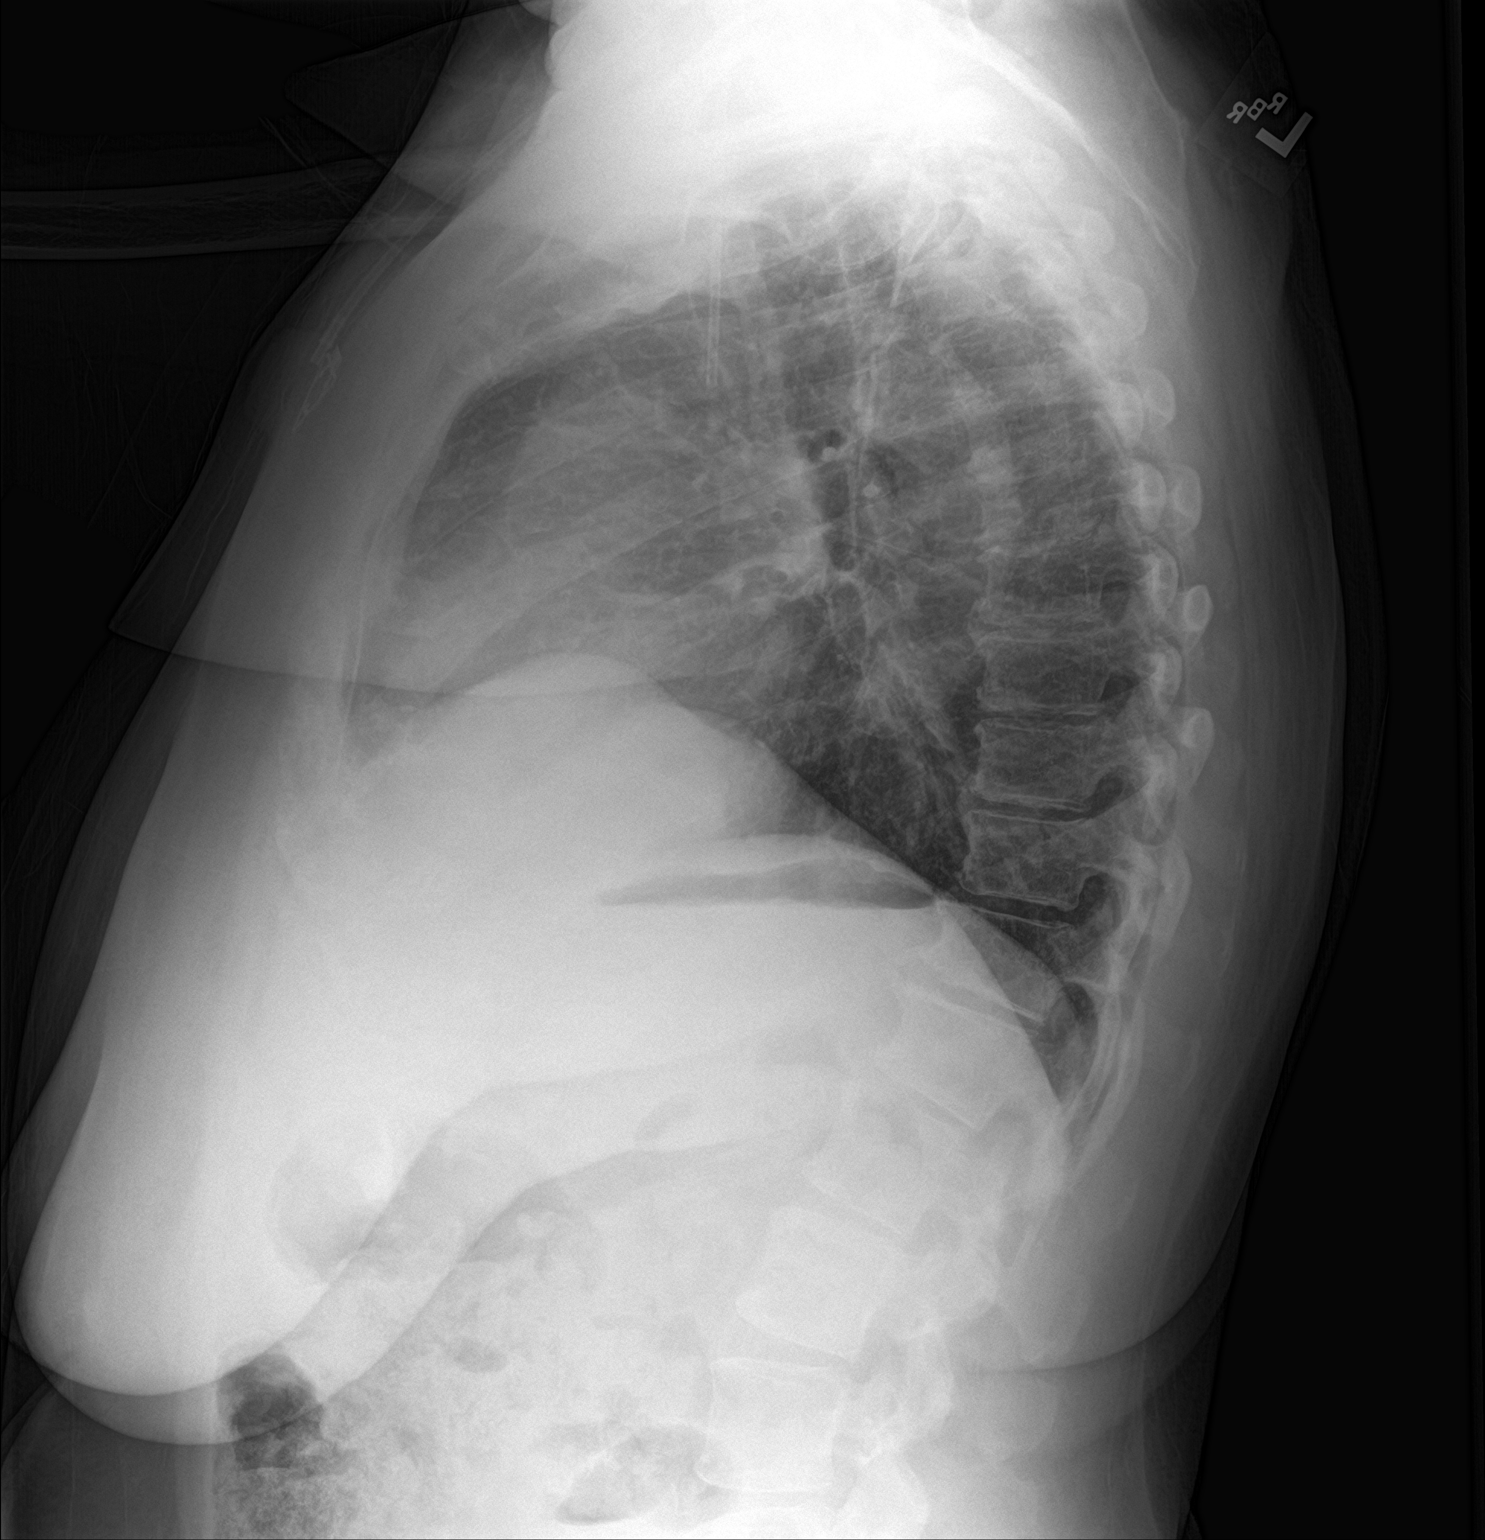

[2 of 2 positions shown; findings below may reference images not displayed]

FINDINGS: Lungs are clear.  No pleural effusion or pneumothorax.

The heart is normal in size.

Right chest power port terminates in the lower SVC.

Degenerative changes of the visualized thoracolumbar spine.
IMPRESSION: No evidence of acute cardiopulmonary disease.

## 2018-09-11 IMAGING — DX DG SHOULDER 2+V*R*
3 series · 3 of 3 positions shown · non-contrast
Comparison: None.

CLINICAL DATA: 66-year-old female with right shoulder pain after
injury. Initial encounter.

EXAM:
RIGHT SHOULDER - 2+ VIEW

[shoulder grashey]
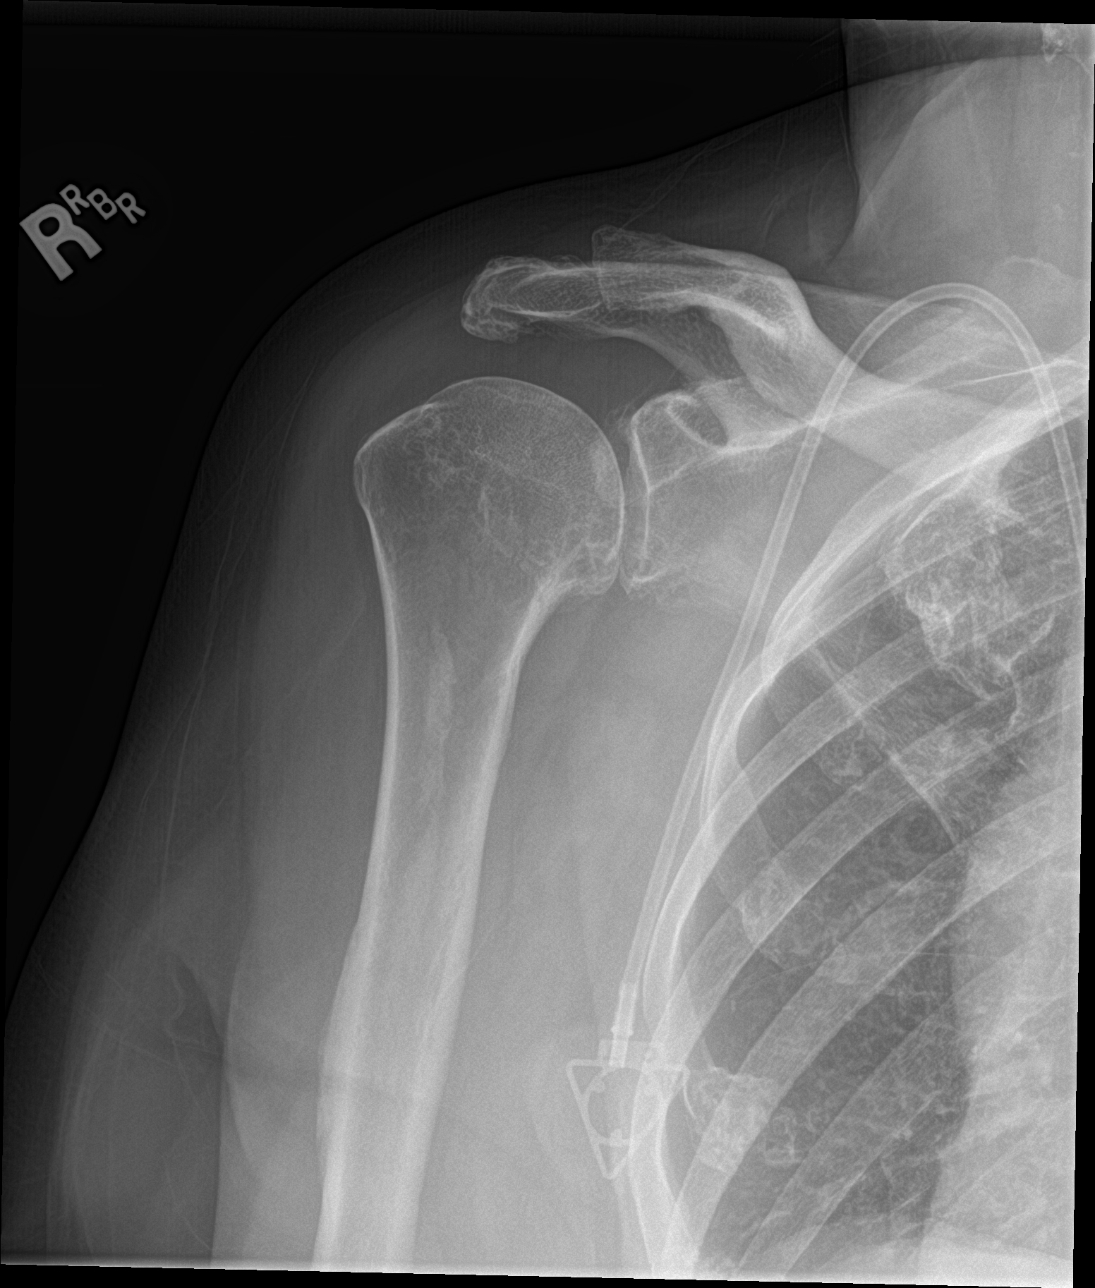

[shoulder y view]
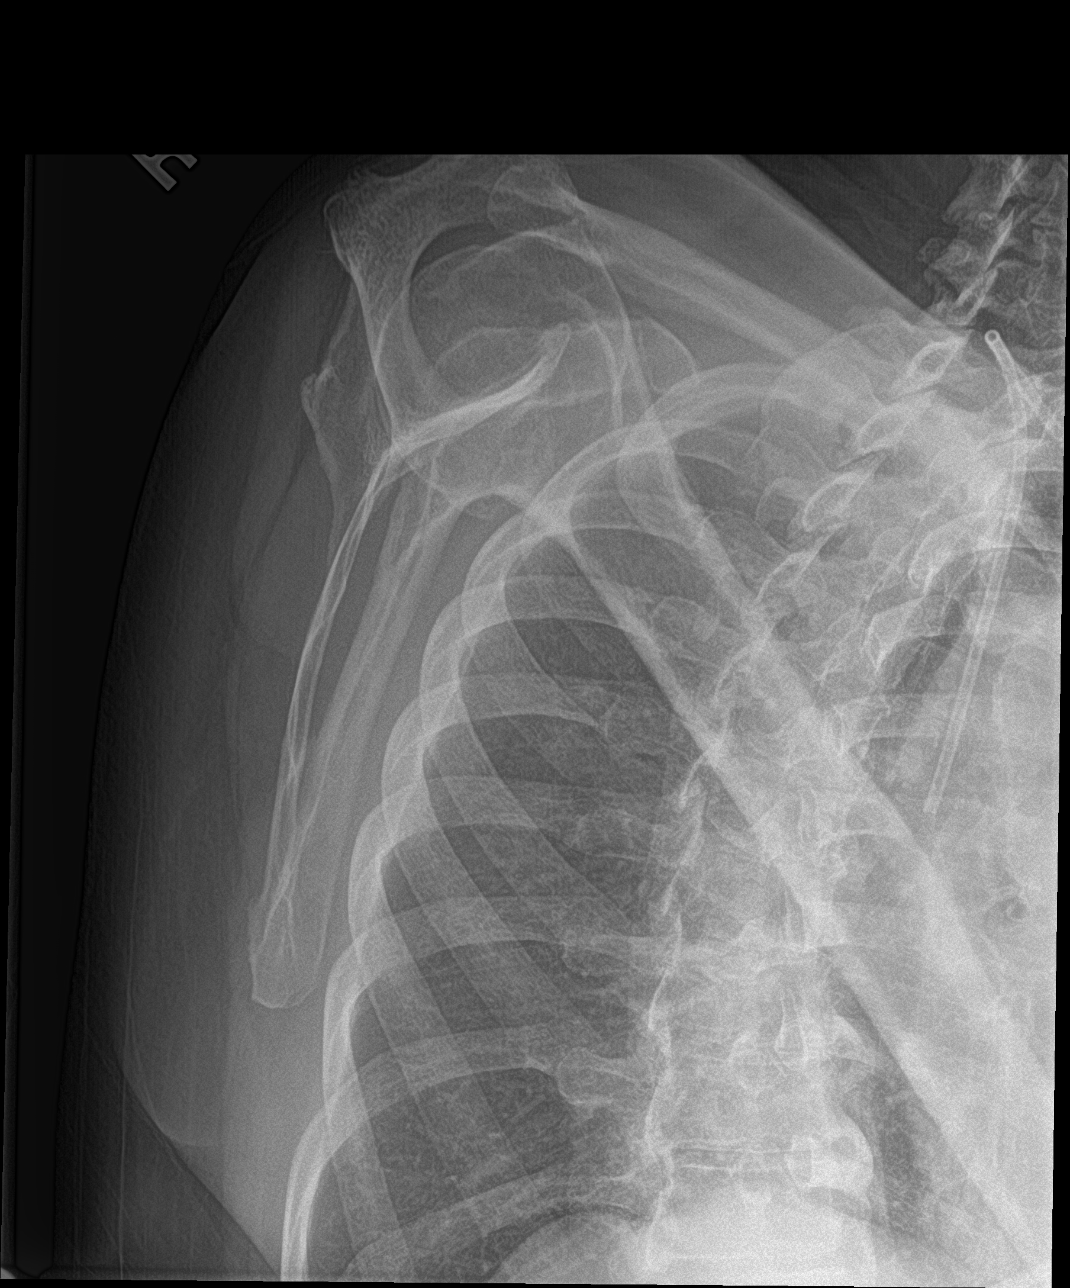

[shoulder axillary]
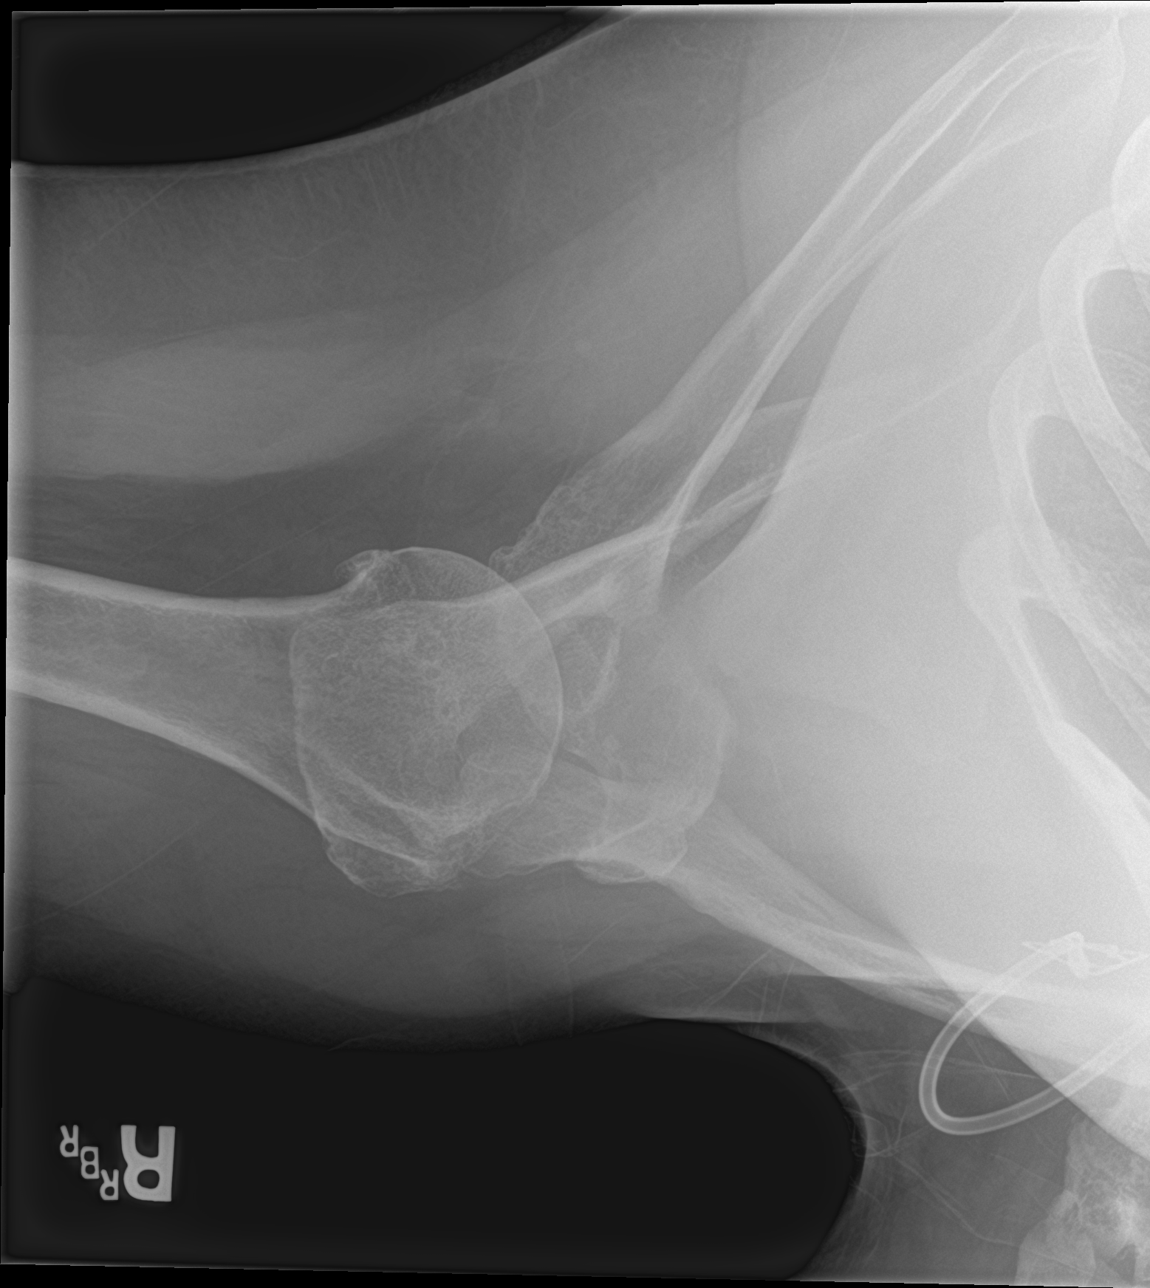

[3 of 3 positions shown; findings below may reference images not displayed]

FINDINGS: No fracture or dislocation.

Acromioclavicular joint and glenohumeral joint degenerative changes.

No abnormal soft tissue calcifications.

Right central line in place.  Visualized lungs clear.
IMPRESSION: No fracture or dislocation.

Acromioclavicular joint and glenohumeral joint degenerative changes.

## 2018-09-12 ENCOUNTER — Other Ambulatory Visit: Payer: Self-pay | Admitting: Hematology & Oncology

## 2018-09-12 ENCOUNTER — Other Ambulatory Visit: Payer: Self-pay | Admitting: Family Medicine

## 2018-09-12 DIAGNOSIS — IMO0002 Reserved for concepts with insufficient information to code with codable children: Secondary | ICD-10-CM

## 2018-09-12 DIAGNOSIS — G894 Chronic pain syndrome: Secondary | ICD-10-CM

## 2018-09-12 DIAGNOSIS — E1165 Type 2 diabetes mellitus with hyperglycemia: Principal | ICD-10-CM

## 2018-09-12 DIAGNOSIS — G5793 Unspecified mononeuropathy of bilateral lower limbs: Secondary | ICD-10-CM

## 2018-09-12 DIAGNOSIS — E1151 Type 2 diabetes mellitus with diabetic peripheral angiopathy without gangrene: Secondary | ICD-10-CM

## 2018-09-12 MED ORDER — GABAPENTIN 400 MG PO CAPS: 800.0000 mg | ORAL_CAPSULE | Freq: Three times a day (TID) | ORAL | 0 refills | Status: DC

## 2018-09-12 NOTE — Telephone Encounter (Signed)
Requested medication (s) are due for refill today: yes  Requested medication (s) are on the active medication list: yes  Last refill:  Previously filled by historical provider  Future visit scheduled: yes  Notes to clinic:  Medications previously filled by historical    Requested Prescriptions  Pending Prescriptions Disp Refills   aspirin 81 MG tablet 30 tablet     Sig: Take 1 tablet (81 mg total) by mouth at bedtime.     Analgesics:  NSAIDS - aspirin Passed - 09/12/2018  4:24 PM      Passed - Patient is not pregnant      Passed - Valid encounter within last 12 months    Recent Outpatient Visits          5 days ago Chest pain, unspecified type   [facility] at [place], DO   3 weeks ago Nausea   [facility] at [place], DO   5 months ago Type 2 diabetes with nephropathy [facility])   [facility] at [place], DO   9 months ago [place] at [place], DO   1 year ago Dental abscess   Archivist at [place], DO      Future Appointments            In 2 weeks [place] at [facility], [facility]          Multiple Vitamin (MULTIVITAMIN WITH MINERALS) TABS tablet      Sig: Take 1 tablet by mouth daily.     There is no refill protocol information for this order    Signed Prescriptions Disp Refills   gabapentin (NEURONTIN) 400 MG capsule 180 capsule 0    Sig: Take 2 capsules (800 mg total) by mouth 3 (three) times daily.     Neurology: Anticonvulsants - gabapentin Passed - 09/12/2018  4:24 PM      Passed - Valid encounter within last 12 months    Recent Outpatient Visits          5 days ago Chest pain, unspecified type   Archivist at  [place], DO   3 weeks ago Nausea   [facility] at [place], DO   5 months ago Type 2 diabetes with nephropathy [facility])   [facility]ivist at [place], DO   9 months ago [place] at [place], DO   1 year ago Dental abscess   [facility] at [place], DO      Future Appointments            In 2 weeks [place], [place] at [facility], [place]

## 2018-09-12 NOTE — Telephone Encounter (Signed)
Copied from [place] [id number]. Topic: Quick Communication - Rx Refill/Question >> Sep 12, 2018  1:57 PM Reyne Dumas L wrote: Medication:  gabapentin (NEURONTIN) 400 MG capsule aspirin 81 MG tablet Multiple Vitamin (MULTIVITAMIN WITH MINERALS) TABS tablet  Pharmacy calling - states they requested this on January 21st and January 22nd with no response so now they are calling. [phone]. Fax #: [phone]

## 2018-09-13 ENCOUNTER — Telehealth: Payer: Self-pay | Admitting: Family Medicine

## 2018-09-13 DIAGNOSIS — G5793 Unspecified mononeuropathy of bilateral lower limbs: Secondary | ICD-10-CM

## 2018-09-13 DIAGNOSIS — G894 Chronic pain syndrome: Secondary | ICD-10-CM

## 2018-09-13 LAB — VITAMIN D 1,25 DIHYDROXY
Vitamin D 1, 25 (OH)2 Total: 56 pg/mL (ref 18–72)
Vitamin D2 1, 25 (OH)2: <8 pg/mL
Vitamin D3 1, 25 (OH)2: 56 pg/mL

## 2018-09-13 MED ORDER — ADULT MULTIVITAMIN W/MINERALS CH: 1.0000 | ORAL_TABLET | Freq: Every day | ORAL | 3 refills | Status: DC

## 2018-09-13 MED ORDER — ASPIRIN 81 MG PO TABS: 81.0000 mg | ORAL_TABLET | Freq: Every day | ORAL | 3 refills | Status: DC

## 2018-09-13 NOTE — Telephone Encounter (Signed)
Copied from [place] [phone]. Topic: Quick Communication - See Telephone Encounter >> Sep 13, 2018  1:07 PM Rayann Heman wrote: CRM for notification. See Telephone encounter for: 09/13/18. Pt called and stated that she would like a call back from Dr Etter Sjogren regarding insulin. Please advise

## 2018-09-14 NOTE — Telephone Encounter (Signed)
Tried calling no answer/no vm.

## 2018-09-17 ENCOUNTER — Ambulatory Visit: Payer: Self-pay | Admitting: Internal Medicine

## 2018-09-18 MED ORDER — GABAPENTIN 800 MG PO TABS: 800.0000 mg | ORAL_TABLET | Freq: Three times a day (TID) | ORAL | 1 refills | Status: DC

## 2018-09-18 MED ORDER — GABAPENTIN 400 MG PO CAPS: 800.0000 mg | ORAL_CAPSULE | Freq: Three times a day (TID) | ORAL | 1 refills | Status: DC

## 2018-09-18 NOTE — Telephone Encounter (Signed)
She needs to do what endo wants her to do

## 2018-09-18 NOTE — Telephone Encounter (Signed)
Patient printer does not work and want [place] to print out a application for the program for trulicity.  Patient is working on trying to get extra help with medicare and social security which will help her to get insulin for 8 bucks.  Do you want to start samples for trulicity?  She is still taking basaglar.

## 2018-09-18 NOTE — Telephone Encounter (Signed)
Patient notified that she will need to call Dr. Cruzita Lederer in regards to insulin needs. Application mailed to patient.

## 2018-09-20 ENCOUNTER — Telehealth: Payer: Self-pay | Admitting: Internal Medicine

## 2018-09-20 NOTE — Telephone Encounter (Signed)
Caller called [facility] and stated she was prescribed an insulin that she can not afford and would like to see if our office has samples.  She did not state the name of this insulin

## 2018-09-20 NOTE — Telephone Encounter (Signed)
We can use whichever long-acting insulin her insurance covers.  For [place] healthcare is usually [place].  We can have her call the insurance to find out different prices: Lantus, Basaglar, Levemir, Toujeo, and we can work with any of those.

## 2018-09-20 NOTE — Telephone Encounter (Signed)
Please advise. It looks like [name] is the only one she is on.

## 2018-09-21 NOTE — Telephone Encounter (Signed)
If we do, we can give her

## 2018-09-21 NOTE — Telephone Encounter (Signed)
Spoke to patient, she states she never took the [place] due to cost and her PCP office gave her samples of Basaglar which she has been taking.  Patient will call her insurance to see co-pay prices but would like to know if we have Basaglar sample to give for now?

## 2018-09-24 ENCOUNTER — Ambulatory Visit (INDEPENDENT_AMBULATORY_CARE_PROVIDER_SITE_OTHER): Payer: Medicare Other

## 2018-09-24 DIAGNOSIS — R002 Palpitations: Secondary | ICD-10-CM | POA: Diagnosis not present

## 2018-09-24 DIAGNOSIS — R000 Tachycardia, unspecified: Secondary | ICD-10-CM

## 2018-09-24 NOTE — Telephone Encounter (Signed)
We do not have any samples of Basaglar.  I tried calling patient, her phone rang several times without answer or VM.

## 2018-09-27 ENCOUNTER — Telehealth: Payer: Self-pay | Admitting: Internal Medicine

## 2018-09-27 NOTE — Telephone Encounter (Signed)
We can try - this is a form like a PA, right?

## 2018-09-27 NOTE — Telephone Encounter (Signed)
Patient is calling to get a Cost Reduction Letter for [employer] for [employer] and Trulicity  If there are questions or concerns please contact patient at [phone] or [phone].

## 2018-09-28 ENCOUNTER — Other Ambulatory Visit: Payer: Self-pay

## 2018-09-28 DIAGNOSIS — R110 Nausea: Secondary | ICD-10-CM

## 2018-09-28 MED ORDER — ISOSORBIDE MONONITRATE ER 30 MG PO TB24: 15.0000 mg | ORAL_TABLET | Freq: Every day | ORAL | 3 refills | Status: DC

## 2018-09-28 MED ORDER — CLOPIDOGREL BISULFATE 75 MG PO TABS: 75.0000 mg | ORAL_TABLET | Freq: Every day | ORAL | 3 refills | Status: DC

## 2018-09-28 MED ORDER — METFORMIN HCL 500 MG PO TABS: 500.0000 mg | ORAL_TABLET | Freq: Two times a day (BID) | ORAL | 3 refills | Status: DC

## 2018-09-28 MED ORDER — ROSUVASTATIN CALCIUM 40 MG PO TABS: 40.0000 mg | ORAL_TABLET | Freq: Every day | ORAL | 3 refills | Status: DC

## 2018-09-28 MED ORDER — METOPROLOL TARTRATE 25 MG PO TABS: 25.0000 mg | ORAL_TABLET | Freq: Two times a day (BID) | ORAL | 3 refills | Status: DC

## 2018-09-28 NOTE — Addendum Note (Signed)
Addended by: Diana Eves on: 09/28/2018 12:03 PM   Modules accepted: Orders

## 2018-09-28 NOTE — Telephone Encounter (Signed)
Rx(s) for pantoprazole and sertraline refused. Deferred to patient's PCP/prescribing provider.

## 2018-09-28 NOTE — Telephone Encounter (Signed)
Rx(s) sent to pharmacy electronically.  

## 2018-10-01 ENCOUNTER — Encounter: Payer: Self-pay | Admitting: Family Medicine

## 2018-10-01 ENCOUNTER — Ambulatory Visit (INDEPENDENT_AMBULATORY_CARE_PROVIDER_SITE_OTHER): Payer: Medicare Other | Admitting: Family Medicine

## 2018-10-01 VITALS — BP 110/78 | HR 76 | Resp 12 | Ht 66.0 in | Wt 220.0 lb

## 2018-10-01 DIAGNOSIS — R5383 Other fatigue: Secondary | ICD-10-CM | POA: Diagnosis not present

## 2018-10-01 DIAGNOSIS — I10 Essential (primary) hypertension: Secondary | ICD-10-CM

## 2018-10-01 DIAGNOSIS — D508 Other iron deficiency anemias: Secondary | ICD-10-CM | POA: Diagnosis not present

## 2018-10-01 DIAGNOSIS — E1169 Type 2 diabetes mellitus with other specified complication: Secondary | ICD-10-CM | POA: Diagnosis not present

## 2018-10-01 DIAGNOSIS — E1121 Type 2 diabetes mellitus with diabetic nephropathy: Secondary | ICD-10-CM

## 2018-10-01 DIAGNOSIS — I2119 ST elevation (STEMI) myocardial infarction involving other coronary artery of inferior wall: Secondary | ICD-10-CM

## 2018-10-01 DIAGNOSIS — N183 Chronic kidney disease, stage 3 unspecified: Secondary | ICD-10-CM

## 2018-10-01 DIAGNOSIS — E785 Hyperlipidemia, unspecified: Secondary | ICD-10-CM

## 2018-10-01 LAB — POC URINALSYSI DIPSTICK (AUTOMATED)
Blood, UA: NEGATIVE
Glucose, UA: POSITIVE — AB
KETONES UA: NEGATIVE
Leukocytes, UA: NEGATIVE
Nitrite, UA: NEGATIVE
PROTEIN UA: POSITIVE — AB
Spec Grav, UA: 1.015 (ref 1.010–1.025)
Urobilinogen, UA: 0.2 E.U./dL
pH, UA: 5.5 (ref 5.0–8.0)

## 2018-10-01 LAB — TSH: TSH: 3.81 u[IU]/mL (ref 0.35–4.50)

## 2018-10-01 LAB — LIPID PANEL
Cholesterol: 88 mg/dL (ref 0–200)
HDL: 40.6 mg/dL (ref >39.00)
LDL Cholesterol: 18 mg/dL (ref 0–99)
NonHDL: 47.85
Total CHOL/HDL Ratio: 2
Triglycerides: 149 mg/dL (ref 0.0–149.0)
VLDL: 29.8 mg/dL (ref 0.0–40.0)

## 2018-10-01 LAB — CBC WITH DIFFERENTIAL/PLATELET
Basophils Absolute: 0 10*3/uL (ref 0.0–0.1)
Basophils Relative: 0.5 % (ref 0.0–3.0)
Eosinophils Absolute: 0.3 10*3/uL (ref 0.0–0.7)
Eosinophils Relative: 3.5 % (ref 0.0–5.0)
HCT: 39.1 % (ref 36.0–46.0)
Hemoglobin: 12.5 g/dL (ref 12.0–15.0)
Lymphocytes Relative: 29.4 % (ref 12.0–46.0)
Lymphs Abs: 2.5 10*3/uL (ref 0.7–4.0)
MCHC: 32.1 g/dL (ref 30.0–36.0)
MCV: 88.1 fl (ref 78.0–100.0)
Monocytes Absolute: 0.7 10*3/uL (ref 0.1–1.0)
Monocytes Relative: 8.5 % (ref 3.0–12.0)
Neutro Abs: 4.9 10*3/uL (ref 1.4–7.7)
Neutrophils Relative %: 58.1 % (ref 43.0–77.0)
Platelets: 306 10*3/uL (ref 150.0–400.0)
RBC: 4.44 Mil/uL (ref 3.87–5.11)
RDW: 14.8 % (ref 11.5–15.5)
WBC: 8.4 10*3/uL (ref 4.0–10.5)

## 2018-10-01 LAB — COMPREHENSIVE METABOLIC PANEL
ALBUMIN: 4 g/dL (ref 3.5–5.2)
ALT: 24 U/L (ref 0–35)
AST: 19 U/L (ref 0–37)
Alkaline Phosphatase: 126 U/L — ABNORMAL HIGH (ref 39–117)
BUN: 25 mg/dL — ABNORMAL HIGH (ref 6–23)
CO2: 28 mEq/L (ref 19–32)
Calcium: 9.8 mg/dL (ref 8.4–10.5)
Chloride: 98 mEq/L (ref 96–112)
Creatinine, Ser: 1.53 mg/dL — ABNORMAL HIGH (ref 0.40–1.20)
GFR: 41.02 mL/min — ABNORMAL LOW (ref >60.00)
Glucose, Bld: 430 mg/dL — ABNORMAL HIGH (ref 70–99)
Potassium: 5 mEq/L (ref 3.5–5.1)
Sodium: 135 mEq/L (ref 135–145)
Total Bilirubin: 0.4 mg/dL (ref 0.2–1.2)
Total Protein: 7.1 g/dL (ref 6.0–8.3)

## 2018-10-01 LAB — IBC + FERRITIN
Ferritin: 252.8 ng/mL (ref 10.0–291.0)
Iron: 55 ug/dL (ref 42–145)
Saturation Ratios: 18.4 % — ABNORMAL LOW (ref 20.0–50.0)
Transferrin: 213 mg/dL (ref 212.0–360.0)

## 2018-10-01 LAB — VITAMIN B12: Vitamin B-12: 803 pg/mL (ref 211–911)

## 2018-10-01 MED ORDER — GABAPENTIN 800 MG PO TABS: 800.0000 mg | ORAL_TABLET | Freq: Three times a day (TID) | ORAL | 1 refills | Status: DC

## 2018-10-01 NOTE — Assessment & Plan Note (Signed)
Per nephrology 

## 2018-10-01 NOTE — Progress Notes (Signed)
Patient ID: [name], female    DOB: 1951/04/27  Age: 68 y.o. MRN: [id number]    Subjective:  Subjective  HPI MAEGEN WIGLE presents for f/u bp and chol  Pt c/o fatigue.  Her sugars are running high == endo is trying to get her meds for her  No more chest pains or palpitations   Review of Systems  Constitutional: Positive for fatigue. Negative for appetite change, diaphoresis and unexpected weight change.  Eyes: Negative for pain, redness and visual disturbance.  Respiratory: Negative for cough, chest tightness, shortness of breath and wheezing.   Cardiovascular: Negative for chest pain, palpitations and leg swelling.  Endocrine: Negative for cold intolerance, heat intolerance, polydipsia, polyphagia and polyuria.  Genitourinary: Negative for difficulty urinating, dysuria and frequency.  Neurological: Negative for dizziness, light-headedness, numbness and headaches.    History Past Medical History:  Diagnosis Date  . Asthma   . Degenerative joint disease   . Diabetes mellitus   . Dyspnea   . GERD (gastroesophageal reflux disease)   . Hyperlipidemia    no longer on medication for this  . Hypertension    patient denies  . Myocardial infarction ([place]) 08/2018  . Neuropathy    bilateral hands and feet  . Sickle cell trait ([place])   . Sleep apnea   . stomach ca dx'd 2010   chemo/xrt comp 05/2009  . TIA (transient ischemic attack) 07/2015    She has a past surgical history that includes Total knee arthroplasty; Total hip arthroplasty; Shoulder surgery; Appendectomy; Cesarean section; Hernia repair; Esophagogastroduodenoscopy (N/A, 10/15/2012); Esophagogastroduodenoscopy (N/A, 01/15/2016); Colon surgery; Colonoscopy with propofol (N/A, 06/01/2016); Coronary/Graft Acute MI Revascularization (N/A, 08/27/2018); LEFT HEART CATH AND CORONARY ANGIOGRAPHY (N/A, 08/27/2018); and CORONARY STENT INTERVENTION (N/A, 08/29/2018).   Her family history includes Alzheimer's disease (age of onset: 72) in  her father; Cancer in her maternal grandfather; Cancer (age of onset: 9) in her mother; Diabetes in her brother and maternal grandmother.She reports that she quit smoking about 29 years ago. Her smoking use included cigarettes. She started smoking about 45 years ago. She has a 15.00 pack-year smoking history. She has never used smokeless tobacco. She reports that she does not drink alcohol or use drugs.  Current Outpatient Medications on File Prior to Visit  Medication Sig Dispense Refill  . albuterol (PROVENTIL HFA;VENTOLIN HFA) 108 (90 Base) MCG/ACT inhaler Inhale 2 puffs into the lungs every 6 (six) hours as needed for wheezing or shortness of breath. 1 Inhaler 2  . albuterol (PROVENTIL) (2.5 MG/3ML) 0.083% nebulizer solution Take 3 mLs (2.5 mg total) by nebulization every 6 (six) hours as needed for wheezing or shortness of breath. 75 mL 12  . aspirin 81 MG tablet Take 1 tablet (81 mg total) by mouth at bedtime. 90 tablet 3  . clopidogrel (PLAVIX) 75 MG tablet Take 1 tablet (75 mg total) by mouth daily. 90 tablet 3  . empagliflozin (JARDIANCE) 10 MG TABS tablet Take 10 mg by mouth daily. 30 tablet 3  . fluticasone (FLONASE) 50 MCG/ACT nasal spray Place 2 sprays into both nostrils daily. 16 g 5  . glimepiride (AMARYL) 4 MG tablet TAKE 1 TABLET BY MOUTH TWICE A DAY 60 tablet 1  . glucose blood (ONETOUCH VERIO) test strip Use as instructed to check blood sugar 3 times a day 300 each 12  . Insulin Degludec (TRESIBA FLEXTOUCH) 200 UNIT/ML SOPN Inject 20 Units into the skin daily. 3 pen 3  . ipratropium-albuterol (DUONEB) 0.5-2.5 (3) MG/3ML SOLN  Take 3 mLs by nebulization every 6 (six) hours as needed. (Patient taking differently: Take 3 mLs by nebulization every 6 (six) hours as needed (sob and wheezing). ) 360 mL 2  . isosorbide mononitrate (IMDUR) 30 MG 24 hr tablet Take 0.5 tablets (15 mg total) by mouth daily. 45 tablet 3  . metFORMIN (GLUCOPHAGE) 500 MG tablet Take 1 tablet (500 mg total) by  mouth 2 (two) times daily with a meal. 180 tablet 3  . metoprolol tartrate (LOPRESSOR) 25 MG tablet Take 1 tablet (25 mg total) by mouth 2 (two) times daily. 180 tablet 3  . Multiple Vitamin (MULTIVITAMIN WITH MINERALS) TABS tablet Take 1 tablet by mouth daily. 90 tablet 3  . nitroGLYCERIN (NITROSTAT) 0.4 MG SL tablet Place 1 tablet (0.4 mg total) under the tongue every 5 (five) minutes as needed. 25 tablet 12  . pantoprazole (PROTONIX) 40 MG tablet Take 1 tablet (40 mg total) by mouth daily. 30 tablet 3  . rosuvastatin (CRESTOR) 40 MG tablet Take 1 tablet (40 mg total) by mouth at bedtime. 90 tablet 3  . sertraline (ZOLOFT) 50 MG tablet TAKE 1 TABLET BY MOUTH EVERY DAY (Patient taking differently: Take 50 mg by mouth daily. ) 90 tablet 1  . sucralfate (CARAFATE) 1 g tablet Take 1 tablet (1 g total) by mouth 2 (two) times daily. 60 tablet 3  . traMADol (ULTRAM) 50 MG tablet TAKE 2 TABLETS BY MOUTH 3 TIMES DAILY AS NEEDED FOR MODERATE PAIN 180 tablet 0   Current Facility-Administered Medications on File Prior to Visit  Medication Dose Route Frequency Provider Last Rate Last Dose  . sodium chloride flush (NS) 0.9 % injection 10 mL  10 mL Intravenous PRN [place], Holli Humbles, NP   10 mL at 09/13/16 1146     Objective:  Objective  Physical Exam Vitals signs and nursing note reviewed.  Constitutional:      Appearance: She is well-developed.  HENT:     Head: Normocephalic and atraumatic.  Eyes:     Conjunctiva/sclera: Conjunctivae normal.  Neck:     Musculoskeletal: Normal range of motion and neck supple.     Thyroid: No thyromegaly.     Vascular: No carotid bruit or JVD.  Cardiovascular:     Rate and Rhythm: Normal rate and regular rhythm.     Heart sounds: Normal heart sounds. No murmur.  Pulmonary:     Effort: Pulmonary effort is normal. No respiratory distress.     Breath sounds: Normal breath sounds. No wheezing or rales.  Chest:     Chest wall: No tenderness.  Neurological:      Mental Status: She is alert and oriented to person, place, and time.    BP 110/78 (BP Location: Left Arm, Patient Position: Sitting, Cuff Size: Large)   Pulse 76   Resp 12   Ht 5' 6"  (1.676 m)   Wt 220 lb (99.8 kg)   SpO2 96%   BMI 35.51 kg/m  Wt Readings from Last 3 Encounters:  10/01/18 220 lb (99.8 kg)  09/10/18 223 lb (101.2 kg)  09/07/18 277 lb 9.6 oz (125.9 kg)     Lab Results  Component Value Date   WBC 8.8 09/07/2018   HGB 12.4 09/07/2018   HCT 37.9 09/07/2018   PLT 382.0 09/07/2018   GLUCOSE 164 (H) 09/07/2018   CHOL 168 08/28/2018   TRIG 187 (H) 08/28/2018   HDL 46 08/28/2018   LDLDIRECT 136.0 08/20/2018   LDLCALC 85 08/28/2018  ALT 20 09/07/2018   AST 22 09/07/2018   NA 141 09/07/2018   K 5.0 09/07/2018   CL 105 09/07/2018   CREATININE 1.43 (H) 09/07/2018   BUN 16 09/07/2018   CO2 27 09/07/2018   TSH 3.72 09/07/2018   INR 1.04 08/27/2018   HGBA1C 13.0 (H) 08/28/2018   MICROALBUR <0.7 08/20/2018    Dg Chest 2 View  Result Date: 09/07/2018 CLINICAL DATA:  Chest pain since MI 5 days ago. EXAM: CHEST - 2 VIEW COMPARISON:  08/12/2018 FINDINGS: Right IJ Port-A-Cath unchanged with tip over the SVC. Lungs are adequately inflated without focal airspace consolidation or effusion. Cardiomediastinal silhouette and remainder of the exam is unchanged. IMPRESSION: No active cardiopulmonary disease. Electronically Signed   By: Marin Olp M.D.   On: 09/07/2018 20:45     Assessment & Plan:  Plan  I am having Leane Platt. Suit maintain her ipratropium-albuterol, albuterol, albuterol, fluticasone, sertraline, sucralfate, pantoprazole, nitroGLYCERIN, empagliflozin, Insulin Degludec, glucose blood, glimepiride, traMADol, aspirin, multivitamin with minerals, rosuvastatin, metoprolol tartrate, clopidogrel, isosorbide mononitrate, metFORMIN, and gabapentin.  Meds ordered this encounter  Medications  . DISCONTD: gabapentin (NEURONTIN) 800 MG tablet    Sig: Take 1 tablet (800  mg total) by mouth 3 (three) times daily.    Dispense:  270 tablet    Refill:  1  . gabapentin (NEURONTIN) 800 MG tablet    Sig: Take 1 tablet (800 mg total) by mouth 3 (three) times daily.    Dispense:  270 tablet    Refill:  1    Problem List Items Addressed This Visit      Unprioritized   Acute ST elevation myocardial infarction (STEMI) of inferior wall (HCC)    No further chest pain  F/u cardiology       CKD (chronic kidney disease) stage 3, GFR 30-59 ml/min ([place])    Per nephrology      Essential hypertension (Chronic)    Well controlled, no changes to meds. Encouraged heart healthy diet such as the DASH diet and exercise as tolerated.       Hyperlipidemia    Tolerating statin, encouraged heart healthy diet, avoid trans fats, minimize simple carbs and saturated fats. Increase exercise as tolerated      Hyperlipidemia associated with type 2 diabetes mellitus (HCC) (Chronic)    Tolerating statin, encouraged heart healthy diet, avoid trans fats, minimize simple carbs and saturated fats. 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 Increase exercise as tolerated. Continue current meds        Other Visit Diagnoses    Fatigue, unspecified type       Relevant Orders   Lipid panel   CBC with Differential/Platelet   TSH   Vitamin B12   Comprehensive metabolic panel   IBC + Ferritin   POCT Urinalysis Dipstick (Automated) (Completed)   Urine Culture      Follow-up: Return in about 6 months (around 04/03/2019) for annual exam, fasting.  Ann Held, DO

## 2018-10-01 NOTE — Assessment & Plan Note (Signed)
Tolerating statin, encouraged heart healthy diet, avoid trans fats, minimize simple carbs and saturated fats. Increase exercise as tolerated 

## 2018-10-01 NOTE — Assessment & Plan Note (Signed)
hgba1c per endo, minimize simple carbs. Increase exercise as tolerated. Continue current meds

## 2018-10-01 NOTE — Patient Instructions (Signed)
DASH Eating Plan  DASH stands for "Dietary Approaches to Stop Hypertension." The DASH eating plan is a healthy eating plan that has been shown to reduce high blood pressure (hypertension). It may also reduce your risk for type 2 diabetes, heart disease, and stroke. The DASH eating plan may also help with weight loss.  What are tips for following this plan?    General guidelines   Avoid eating more than 2,300 mg (milligrams) of salt (sodium) a day. If you have hypertension, you may need to reduce your sodium intake to 1,500 mg a day.   Limit alcohol intake to no more than 1 drink a day for nonpregnant women and 2 drinks a day for men. One drink equals 12 oz of beer, 5 oz of wine, or 1 oz of hard liquor.   Work with your health care provider to maintain a healthy body weight or to lose weight. Ask what an ideal weight is for you.   Get at least 30 minutes of exercise that causes your heart to beat faster (aerobic exercise) most days of the week. Activities may include walking, swimming, or biking.   Work with your health care provider or diet and nutrition specialist (dietitian) to adjust your eating plan to your individual calorie needs.  Reading food labels     Check food labels for the amount of sodium per serving. Choose foods with less than 5 percent of the Daily Value of sodium. Generally, foods with less than 300 mg of sodium per serving fit into this eating plan.   To find whole grains, look for the word "whole" as the first word in the ingredient list.  Shopping   Buy products labeled as "low-sodium" or "no salt added."   Buy fresh foods. Avoid canned foods and premade or frozen meals.  Cooking   Avoid adding salt when cooking. Use salt-free seasonings or herbs instead of table salt or sea salt. Check with your health care provider or pharmacist before using salt substitutes.   Do not fry foods. Cook foods using healthy methods such as baking, boiling, grilling, and broiling instead.   Cook with  heart-healthy oils, such as olive, canola, soybean, or sunflower oil.  Meal planning   Eat a balanced diet that includes:  ? 5 or more servings of fruits and vegetables each day. At each meal, try to fill half of your plate with fruits and vegetables.  ? Up to 6-8 servings of whole grains each day.  ? Less than 6 oz of lean meat, poultry, or fish each day. A 3-oz serving of meat is about the same size as a deck of cards. One egg equals 1 oz.  ? 2 servings of low-fat dairy each day.  ? A serving of nuts, seeds, or beans 5 times each week.  ? Heart-healthy fats. Healthy fats called Omega-3 fatty acids are found in foods such as flaxseeds and coldwater fish, like sardines, salmon, and mackerel.   Limit how much you eat of the following:  ? Canned or prepackaged foods.  ? Food that is high in trans fat, such as fried foods.  ? Food that is high in saturated fat, such as fatty meat.  ? Sweets, desserts, sugary drinks, and other foods with added sugar.  ? Full-fat dairy products.   Do not salt foods before eating.   Try to eat at least 2 vegetarian meals each week.   Eat more home-cooked food and less restaurant, buffet, and fast food.     When eating at a restaurant, ask that your food be prepared with less salt or no salt, if possible.  What foods are recommended?  The items listed may not be a complete list. Talk with your dietitian about what dietary choices are best for you.  Grains  Whole-grain or whole-wheat bread. Whole-grain or whole-wheat pasta. Brown rice. Oatmeal. Quinoa. Bulgur. Whole-grain and low-sodium cereals. Pita bread. Low-fat, low-sodium crackers. Whole-wheat flour tortillas.  Vegetables  Fresh or frozen vegetables (raw, steamed, roasted, or grilled). Low-sodium or reduced-sodium tomato and vegetable juice. Low-sodium or reduced-sodium tomato sauce and tomato paste. Low-sodium or reduced-sodium canned vegetables.  Fruits  All fresh, dried, or frozen fruit. Canned fruit in natural juice (without  added sugar).  Meat and other protein foods  Skinless chicken or turkey. Ground chicken or turkey. Pork with fat trimmed off. Fish and seafood. Egg whites. Dried beans, peas, or lentils. Unsalted nuts, nut butters, and seeds. Unsalted canned beans. Lean cuts of beef with fat trimmed off. Low-sodium, lean deli meat.  Dairy  Low-fat (1%) or fat-free (skim) milk. Fat-free, low-fat, or reduced-fat cheeses. Nonfat, low-sodium ricotta or cottage cheese. Low-fat or nonfat yogurt. Low-fat, low-sodium cheese.  Fats and oils  Soft margarine without trans fats. Vegetable oil. Low-fat, reduced-fat, or light mayonnaise and salad dressings (reduced-sodium). Canola, safflower, olive, soybean, and sunflower oils. Avocado.  Seasoning and other foods  Herbs. Spices. Seasoning mixes without salt. Unsalted popcorn and pretzels. Fat-free sweets.  What foods are not recommended?  The items listed may not be a complete list. Talk with your dietitian about what dietary choices are best for you.  Grains  Baked goods made with fat, such as croissants, muffins, or some breads. Dry pasta or rice meal packs.  Vegetables  Creamed or fried vegetables. Vegetables in a cheese sauce. Regular canned vegetables (not low-sodium or reduced-sodium). Regular canned tomato sauce and paste (not low-sodium or reduced-sodium). Regular tomato and vegetable juice (not low-sodium or reduced-sodium). Pickles. Olives.  Fruits  Canned fruit in a light or heavy syrup. Fried fruit. Fruit in cream or butter sauce.  Meat and other protein foods  Fatty cuts of meat. Ribs. Fried meat. Bacon. Sausage. Bologna and other processed lunch meats. Salami. Fatback. Hotdogs. Bratwurst. Salted nuts and seeds. Canned beans with added salt. Canned or smoked fish. Whole eggs or egg yolks. Chicken or turkey with skin.  Dairy  Whole or 2% milk, cream, and half-and-half. Whole or full-fat cream cheese. Whole-fat or sweetened yogurt. Full-fat cheese. Nondairy creamers. Whipped toppings.  Processed cheese and cheese spreads.  Fats and oils  Butter. Stick margarine. Lard. Shortening. Ghee. Bacon fat. Tropical oils, such as coconut, palm kernel, or palm oil.  Seasoning and other foods  Salted popcorn and pretzels. Onion salt, garlic salt, seasoned salt, table salt, and sea salt. Worcestershire sauce. Tartar sauce. Barbecue sauce. Teriyaki sauce. Soy sauce, including reduced-sodium. Steak sauce. Canned and packaged gravies. Fish sauce. Oyster sauce. Cocktail sauce. Horseradish that you find on the shelf. Ketchup. Mustard. Meat flavorings and tenderizers. Bouillon cubes. Hot sauce and Tabasco sauce. Premade or packaged marinades. Premade or packaged taco seasonings. Relishes. Regular salad dressings.  Where to find more information:   National Heart, Lung, and Blood Institute: www.nhlbi.nih.gov   American Heart Association: www.heart.org  Summary   The DASH eating plan is a healthy eating plan that has been shown to reduce high blood pressure (hypertension). It may also reduce your risk for type 2 diabetes, heart disease, and stroke.   With the   DASH eating plan, you should limit salt (sodium) intake to 2,300 mg a day. If you have hypertension, you may need to reduce your sodium intake to 1,500 mg a day.   When on the DASH eating plan, aim to eat more fresh fruits and vegetables, whole grains, lean proteins, low-fat dairy, and heart-healthy fats.   Work with your health care provider or diet and nutrition specialist (dietitian) to adjust your eating plan to your individual calorie needs.  This information is not intended to replace advice given to you by your health care provider. Make sure you discuss any questions you have with your health care provider.  Document Released: 06/30/2011 Document Revised: 07/04/2016 Document Reviewed: 07/04/2016  Elsevier Interactive Patient Education  2019 [employer]

## 2018-10-01 NOTE — Assessment & Plan Note (Signed)
No further chest pain  F/u cardiology

## 2018-10-01 NOTE — Assessment & Plan Note (Signed)
Well controlled, no changes to meds. Encouraged heart healthy diet such as the DASH diet and exercise as tolerated.  °

## 2018-10-02 ENCOUNTER — Telehealth: Payer: Self-pay | Admitting: *Deleted

## 2018-10-02 ENCOUNTER — Other Ambulatory Visit: Payer: Self-pay | Admitting: Family Medicine

## 2018-10-02 DIAGNOSIS — G894 Chronic pain syndrome: Secondary | ICD-10-CM

## 2018-10-02 LAB — URINE CULTURE
MICRO NUMBER:: 295794
SPECIMEN QUALITY:: ADEQUATE

## 2018-10-02 IMAGING — US US SOFT TISSUE HEAD/NECK
1 series · 16 of 25 positions shown · non-contrast
Comparison: 09/21/2015

CLINICAL DATA: Prior ultrasound follow-up. Follow-up multinodular
goiter.

EXAM:
THYROID ULTRASOUND
TECHNIQUE: Ultrasound examination of the thyroid gland and adjacent soft
tissues was performed.

[Series 1: us soft tissue head/neck · 0.07mm/px · 16 of 42 slices shown]
[im 1/42]
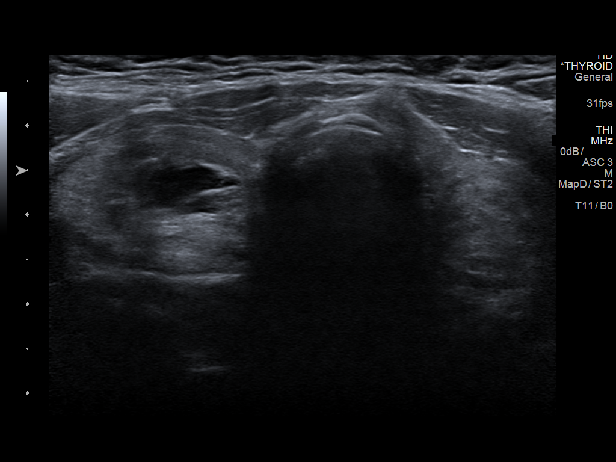
[im 4/42]
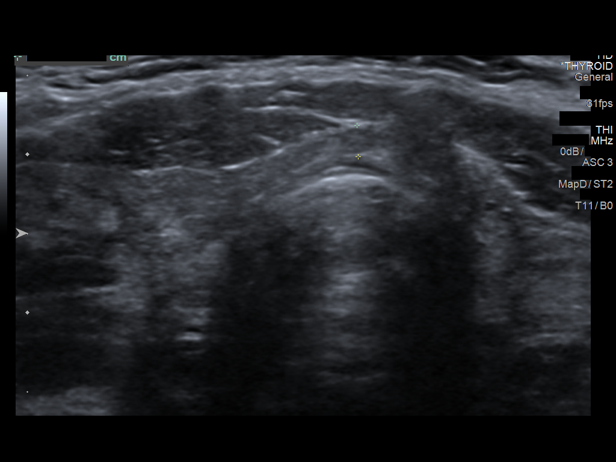
[im 6/42]
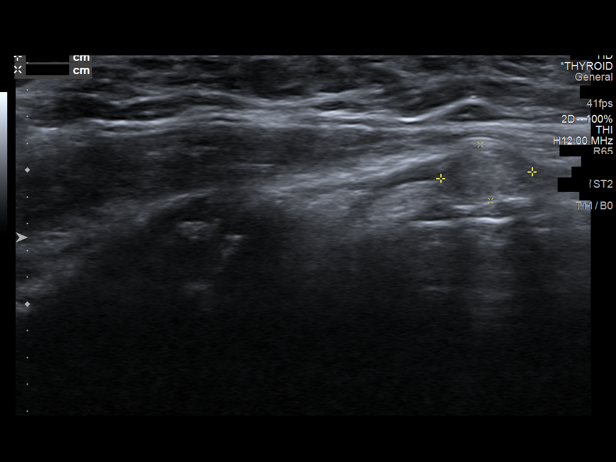
[im 9/42]
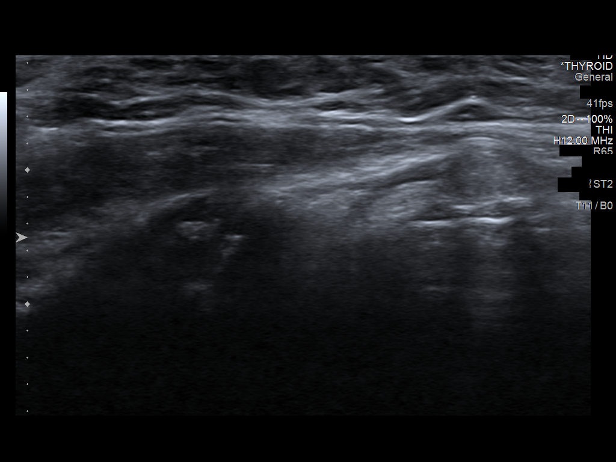
[im 12/42]
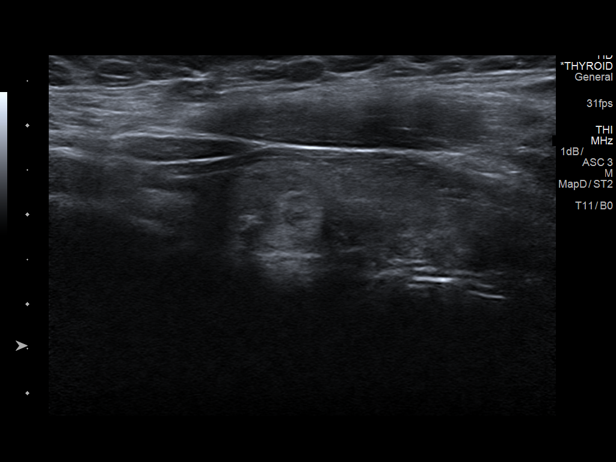
[im 14/42]
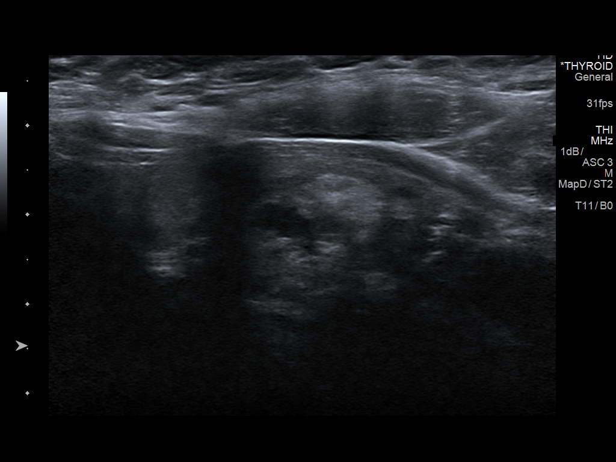
[im 18/42]
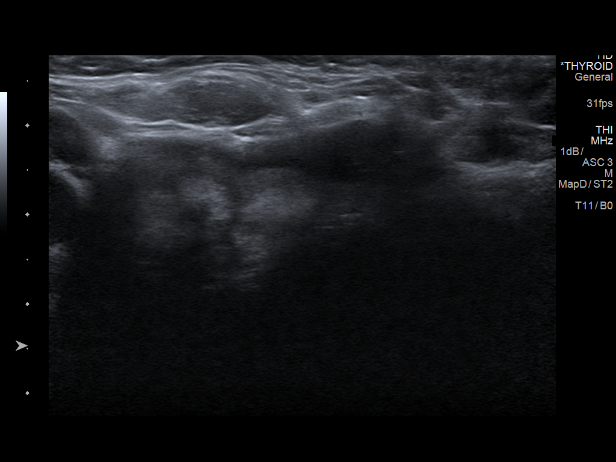
[im 19/42]
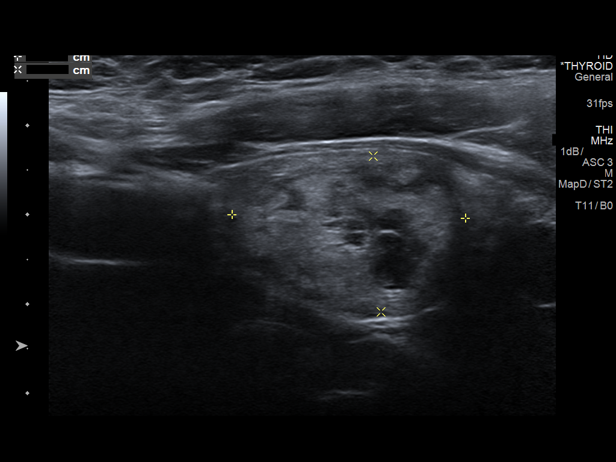
[im 23/42]
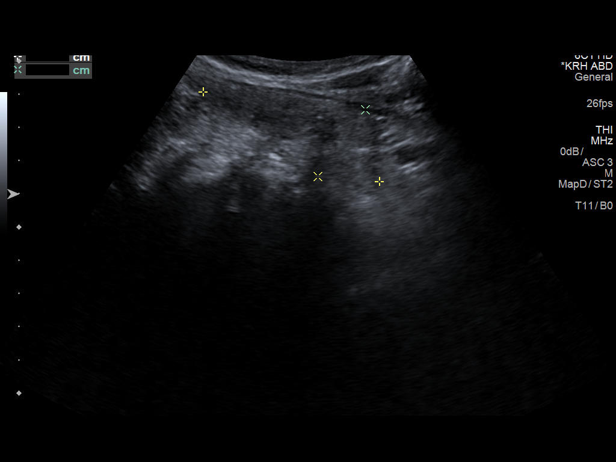
[im 24/42]
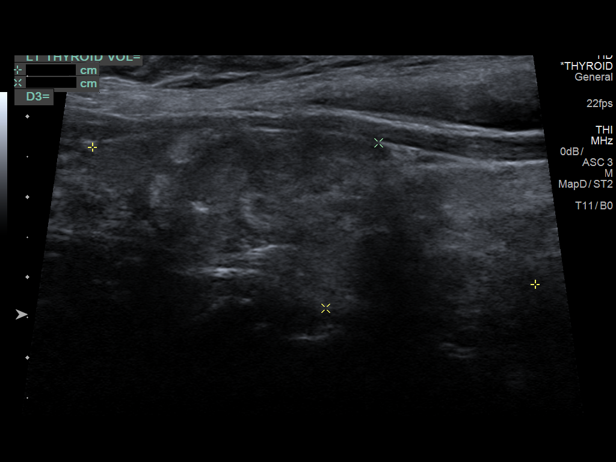
[im 28/42]
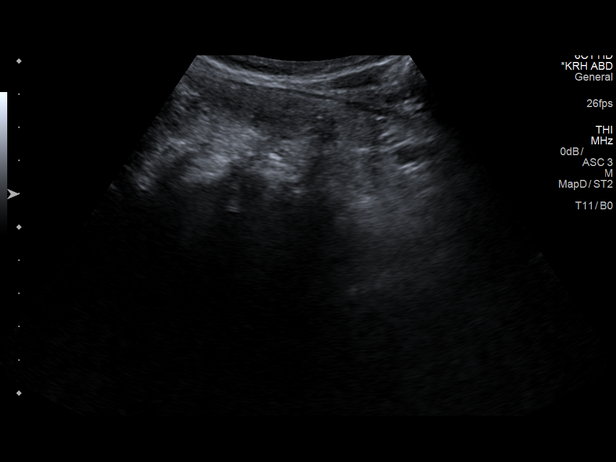
[im 30/42]
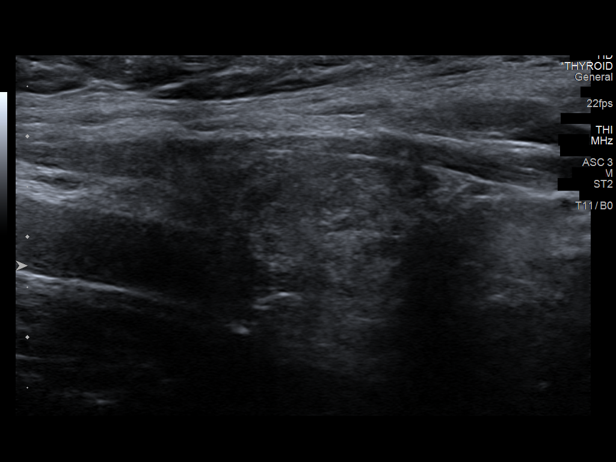
[im 33/42]
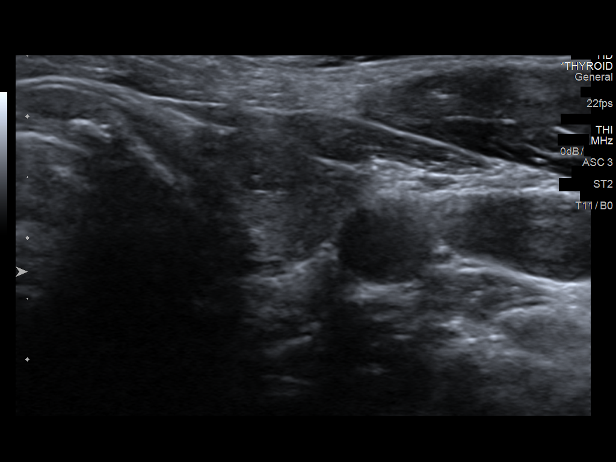
[im 36/42]
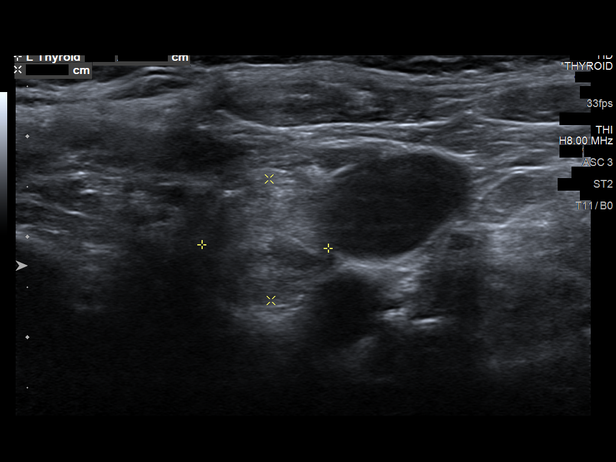
[im 38/42]
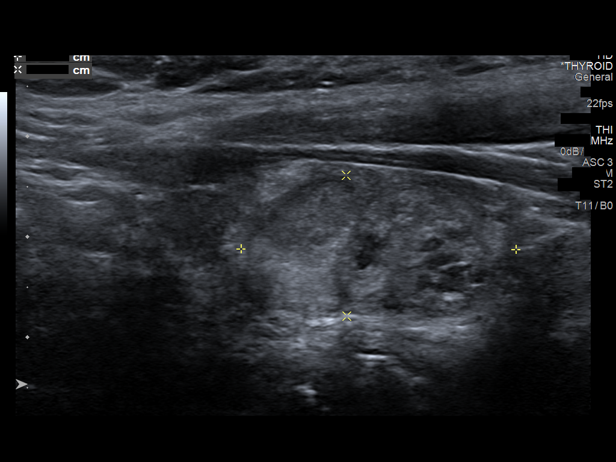
[im 42/42]
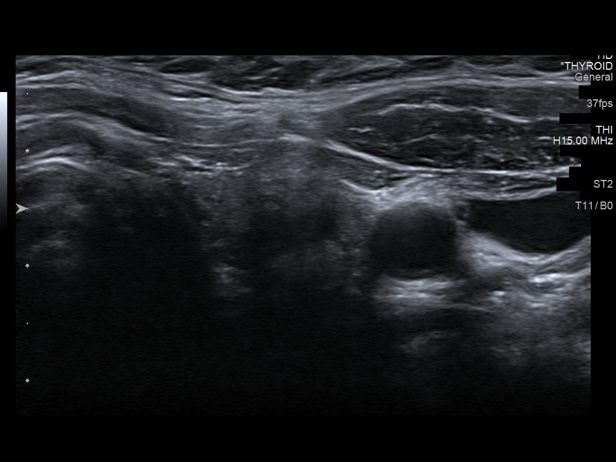

[16 of 25 positions shown; findings below may reference images not displayed]

FINDINGS: Parenchymal Echotexture: Moderately heterogenous

Isthmus: 0.2 cm, previously 0.2 cm

Right lobe: 4.0 x 2.1 x 2.4 cm, previously 4.4 x 2.5 x 2.5 cm

Left lobe: 5.8 x 2.2 x 2.0 cm, previously 5.2 x 1.6 x 2.0 cm

_________________________________________________________

Estimated total number of nodules >/= 1 cm: 6-10

Number of spongiform nodules >/=  2 cm not described below (TR1): 0

Number of mixed cystic and solid nodules >/= 1.5 cm not described
below (TR2): 0

_________________________________________________________

Nodule # 1:

Prior biopsy: No

Location: Right; Superior

Maximum size: 1.2 cm; Other 2 dimensions: 1.1 x 1.0 cm, previously,
1.3 x 1.2 x 1.0 cm

Composition: solid/almost completely solid (2)

Echogenicity: hypoechoic (2)

Shape: not taller-than-wide (0)

Margins: smooth (0)

Echogenic foci: macrocalcifications (1)

ACR TI-RADS total points: 5.

ACR TI-RADS risk category:  TR4 (4-6 points).

Significant change in size (>/= 20% in two dimensions and minimal
increase of 2 mm): No

Change in features: No

Change in ACR TI-RADS risk category: No

ACR TI-RADS recommendations:

*Given size (>/= 1 - 1.4 cm) and appearance, a follow-up ultrasound
in 1 year should be considered based on TI-RADS criteria.

_________________________________________________________

Nodule # 2:

Prior biopsy: No

Location: Right; Mid

Maximum size: 2.6 cm; Other 2 dimensions: 2.2 x 1.8 cm, previously,
2.5 x 1.8 x 2.2 cm

Composition: solid/almost completely solid (2)

Echogenicity: hypoechoic (2)

Shape: not taller-than-wide (0)

Margins: smooth (0)

Echogenic foci: none (0)

ACR TI-RADS total points: 4.

ACR TI-RADS risk category:  TR4 (4-6 points).

Significant change in size (>/= 20% in two dimensions and minimal
increase of 2 mm): No

Change in features: No

Change in ACR TI-RADS risk category: No

ACR TI-RADS recommendations:

**Given size (>/= 1.5 cm) and appearance, fine needle aspiration of
this moderately suspicious nodule should be considered based on
TI-RADS criteria.

_________________________________________________________

Nodule # 3:

Prior biopsy: No

Location: Right; Inferior

Maximum size: 1.2 cm; Other 2 dimensions: 0.6 x 0.9 cm, previously,
1.1 x 1.0 x 0.9 cm

Composition: solid/almost completely solid (2)

Echogenicity: hyperechoic (1)

Shape: not taller-than-wide (0)

Margins: ill-defined (0)

Echogenic foci: none (0)

ACR TI-RADS total points: 3.

ACR TI-RADS risk category:  TR3 (3 points).

Significant change in size (>/= 20% in two dimensions and minimal
increase of 2 mm): No

Change in features: No

Change in ACR TI-RADS risk category: No

ACR TI-RADS recommendations:

Given size (<1.4 cm) and appearance, this nodule does NOT meet
TI-RADS criteria for biopsy or dedicated follow-up.

_________________________________________________________

Nodule # 4:

Prior biopsy: No

Location: Left; Mid

Maximum size: 2.7 cm; Other 2 dimensions: 1.4 x 1.9 cm, previously,
2.9 x 1.7 x 1.5 cm

Composition: solid/almost completely solid (2)

Echogenicity: hypoechoic (2)

Shape: not taller-than-wide (0)

Margins: smooth (0)

Echogenic foci: none (0)

ACR TI-RADS total points: 4.

ACR TI-RADS risk category:  TR4 (4-6 points).

Significant change in size (>/= 20% in two dimensions and minimal
increase of 2 mm): No

Change in features: No

Change in ACR TI-RADS risk category: No

ACR TI-RADS recommendations:

**Given size (>/= 1.5 cm) and appearance, fine needle aspiration of
this moderately suspicious nodule should be considered based on
TI-RADS criteria.

_________________________________________________________

Nodule # 5:

Prior biopsy: No

Location: Left; Inferior

Maximum size: 1.7 cm; Other 2 dimensions: 1.3 x 1.3 cm, previously,
1.8 x 1.4 x 1.5 cm

Composition: solid/almost completely solid (2)

Echogenicity: hypoechoic (2)

Shape: not taller-than-wide (0)

Margins: smooth (0)

Echogenic foci: none (0)

ACR TI-RADS total points: 4.

ACR TI-RADS risk category:  TR4 (4-6 points).

Significant change in size (>/= 20% in two dimensions and minimal
increase of 2 mm): No

Change in features: No

Change in ACR TI-RADS risk category: No

ACR TI-RADS recommendations:

**Given size (>/= 1.5 cm) and appearance, fine needle aspiration of
this moderately suspicious nodule should be considered based on
TI-RADS criteria.

_________________________________________________________
IMPRESSION: Multiple bilateral nodules are all stable in appearance and size.
Nodules 2, 4, and 5 continue to meet criteria for biopsy. Nodule 1
meets criteria for annual follow-up.

The above is in keeping with the ACR TI-RADS recommendations - [HOSPITAL] 4272;[DATE].

## 2018-10-02 MED ORDER — TRAMADOL HCL 50 MG PO TABS: ORAL_TABLET | ORAL | 0 refills | Status: DC

## 2018-10-02 MED ORDER — GABAPENTIN 800 MG PO TABS: 800.0000 mg | ORAL_TABLET | Freq: Three times a day (TID) | ORAL | 1 refills | Status: DC

## 2018-10-02 NOTE — Telephone Encounter (Signed)
[facility] requesting tramadol   Last written: 09/12/18 Last ov: 10/01/18 Next ov: none Contract: 12/06/18 UDS: can get at next ov

## 2018-10-02 NOTE — Telephone Encounter (Signed)
I am not familiar with this, I can ask Colletta [place].

## 2018-10-02 NOTE — Telephone Encounter (Signed)
It would be the patient assistance form there is no price reduction form. Could also offer the discount card

## 2018-10-04 ENCOUNTER — Other Ambulatory Visit: Payer: Self-pay

## 2018-10-04 DIAGNOSIS — R110 Nausea: Secondary | ICD-10-CM

## 2018-10-04 NOTE — Telephone Encounter (Signed)
Rx refused. Deferred to patient's PCP.

## 2018-10-05 ENCOUNTER — Telehealth: Payer: Self-pay | Admitting: Hematology & Oncology

## 2018-10-05 NOTE — Telephone Encounter (Signed)
Received call from pt 3/13 to r/s 3/16 appt to 11/15/18 at 11am

## 2018-10-08 ENCOUNTER — Ambulatory Visit: Payer: Medicare Other | Admitting: Adult Health

## 2018-10-08 ENCOUNTER — Other Ambulatory Visit: Payer: Self-pay

## 2018-10-08 ENCOUNTER — Ambulatory Visit: Payer: Self-pay | Admitting: Hematology & Oncology

## 2018-10-15 ENCOUNTER — Telehealth: Payer: Self-pay | Admitting: Internal Medicine

## 2018-10-15 NOTE — Telephone Encounter (Signed)
We can give her some samples if we do have them, however, in the long run, if she cannot afford them, we may need to switch from NovoLog to regular insulin and may need to increase the dose to compensate for the lack of Trulicity.  I think these were the 2 as she was having problems with per previous messages.

## 2018-10-15 NOTE — Telephone Encounter (Signed)
Patient called re: PA for insulin. Patient cannot afford the medication that was last prescribed. Patient cannot remember the name of the medications (Trulicity, Novalog & Tyler Aas are 3 of them). Patient is hoping that with PA the insurance company will approve the insulin at an affordable price (by changing tier level). Patient is requesting samples of medication to lower her blood sugar levels. Please call patient at ph# [phone] to advise.

## 2018-10-15 NOTE — Telephone Encounter (Signed)
Please advise.  We can not do a tier reduction request because it is already at the lowest.

## 2018-10-16 NOTE — Progress Notes (Deleted)
Cardiology Office Note   Date:  10/16/2018   ID:  Jessica, Jefferson 02-03-1951, MRN [id number]  PCP:  Ann Held, DO  Cardiologist:  Dr. Ellyn Hack  No chief complaint on file.    History of Present Illness: [name] is a 68 y.o. female who presents for ongoing assessment and management of CAD, with recent admission to [facility] in February of 2020, having emergent cardiac cath which revealed two vessel CAD with 100% occlusion of the proxiimal-mid RCA treated with a DES and focal 90% Cx-OM treated with a staged PCI with DES on 08/29/2018.  On last office visit, 09/10/2018 she was complaining of shortness of breath on Brilinta. She was changed to Plavix with 300 mg reloading, and then 75 mg daily thereafter.  She is here for follow up on her response to medications. A 48 hour monitor was place due to complaints of palpitations.   Holter monitor revealed   The average heart rate, excluding ectopy, was 70 BPM with a minimum of 53 BPM at 13:32 D2 and a maximum of 116 BPM at 20:46 D2.  Trivial PACs noted - but 2-3 short 3 beat runs of PAT noted.  Rara PVCs - moslty singles; a few pairs with some bi & tri geminy.  NO true arrhythmias or pauses noted.  Pretty normal monitor results.  Past Medical History:  Diagnosis Date  . Asthma   . Degenerative joint disease   . Diabetes mellitus   . Dyspnea   . GERD (gastroesophageal reflux disease)   . Hyperlipidemia    no longer on medication for this  . Hypertension    patient denies  . Myocardial infarction ([place]) 08/2018  . Neuropathy    bilateral hands and feet  . Sickle cell trait ([place])   . Sleep apnea   . stomach ca dx'd 2010   chemo/xrt comp 05/2009  . TIA (transient ischemic attack) 07/2015    Past Surgical History:  Procedure Laterality Date  . APPENDECTOMY    . CESAREAN SECTION    . COLON SURGERY     colonscopy  . COLONOSCOPY WITH PROPOFOL N/A 06/01/2016   Procedure: COLONOSCOPY WITH PROPOFOL;  Surgeon: Wilford Corner, MD;  Location: [facility] ENDOSCOPY;  Service: Endoscopy;  Laterality: N/A;  . CORONARY STENT INTERVENTION N/A 08/29/2018   Procedure: CORONARY STENT INTERVENTION;  Surgeon: Wellington Hampshire, MD;  Location: [place] CV LAB;  Service: Cardiovascular;  Laterality: N/A;  . CORONARY/GRAFT ACUTE MI REVASCULARIZATION N/A 08/27/2018   Procedure: Coronary/Graft Acute MI Revascularization;  Surgeon: Leonie Man, MD;  Location: [place] [facility] LAB;  Service: Cardiovascular;  Laterality: N/A;  . ESOPHAGOGASTRODUODENOSCOPY N/A 10/15/2012   Procedure: ESOPHAGOGASTRODUODENOSCOPY (EGD);  Surgeon: Lear Ng, MD;  Location: [facility] ENDOSCOPY;  Service: Endoscopy;  Laterality: N/A;  . ESOPHAGOGASTRODUODENOSCOPY N/A 01/15/2016   Procedure: ESOPHAGOGASTRODUODENOSCOPY (EGD);  Surgeon: Ronald Lobo, MD;  Location: [facility] ENDOSCOPY;  Service: Endoscopy;  Laterality: N/A;  . HERNIA REPAIR    . LEFT HEART CATH AND CORONARY ANGIOGRAPHY N/A 08/27/2018   Procedure: LEFT HEART CATH AND CORONARY ANGIOGRAPHY;  Surgeon: Leonie Man, MD;  Location: [facility] LAB;  Service: Cardiovascular;  Laterality: N/A;  . SHOULDER SURGERY     Left  . TOTAL HIP ARTHROPLASTY     Right  . TOTAL KNEE ARTHROPLASTY     Left     Current Outpatient Medications  Medication Sig Dispense Refill  . albuterol (PROVENTIL HFA;VENTOLIN HFA) 108 (90 Base) MCG/ACT inhaler  Inhale 2 puffs into the lungs every 6 (six) hours as needed for wheezing or shortness of breath. 1 Inhaler 2  . albuterol (PROVENTIL) (2.5 MG/3ML) 0.083% nebulizer solution Take 3 mLs (2.5 mg total) by nebulization every 6 (six) hours as needed for wheezing or shortness of breath. 75 mL 12  . aspirin 81 MG tablet Take 1 tablet (81 mg total) by mouth at bedtime. 90 tablet 3  . clopidogrel (PLAVIX) 75 MG tablet Take 1 tablet (75 mg total) by mouth daily. 90 tablet 3  . empagliflozin (JARDIANCE) 10 MG TABS tablet Take 10 mg by mouth daily. 30 tablet 3  . fluticasone  (FLONASE) 50 MCG/ACT nasal spray Place 2 sprays into both nostrils daily. 16 g 5  . gabapentin (NEURONTIN) 800 MG tablet Take 1 tablet (800 mg total) by mouth 3 (three) times daily. 270 tablet 1  . glimepiride (AMARYL) 4 MG tablet TAKE 1 TABLET BY MOUTH TWICE A DAY 60 tablet 1  . glucose blood (ONETOUCH VERIO) test strip Use as instructed to check blood sugar 3 times a day 300 each 12  . Insulin Degludec (TRESIBA FLEXTOUCH) 200 UNIT/ML SOPN Inject 20 Units into the skin daily. 3 pen 3  . ipratropium-albuterol (DUONEB) 0.5-2.5 (3) MG/3ML SOLN Take 3 mLs by nebulization every 6 (six) hours as needed. (Patient taking differently: Take 3 mLs by nebulization every 6 (six) hours as needed (sob and wheezing). ) 360 mL 2  . isosorbide mononitrate (IMDUR) 30 MG 24 hr tablet Take 0.5 tablets (15 mg total) by mouth daily. 45 tablet 3  . metFORMIN (GLUCOPHAGE) 500 MG tablet Take 1 tablet (500 mg total) by mouth 2 (two) times daily with a meal. 180 tablet 3  . metoprolol tartrate (LOPRESSOR) 25 MG tablet Take 1 tablet (25 mg total) by mouth 2 (two) times daily. 180 tablet 3  . Multiple Vitamin (MULTIVITAMIN WITH MINERALS) TABS tablet Take 1 tablet by mouth daily. 90 tablet 3  . nitroGLYCERIN (NITROSTAT) 0.4 MG SL tablet Place 1 tablet (0.4 mg total) under the tongue every 5 (five) minutes as needed. 25 tablet 12  . pantoprazole (PROTONIX) 40 MG tablet Take 1 tablet (40 mg total) by mouth daily. 30 tablet 3  . rosuvastatin (CRESTOR) 40 MG tablet Take 1 tablet (40 mg total) by mouth at bedtime. 90 tablet 3  . sertraline (ZOLOFT) 50 MG tablet TAKE 1 TABLET BY MOUTH EVERY DAY (Patient taking differently: Take 50 mg by mouth daily. ) 90 tablet 1  . sucralfate (CARAFATE) 1 g tablet Take 1 tablet (1 g total) by mouth 2 (two) times daily. 60 tablet 3  . traMADol (ULTRAM) 50 MG tablet TAKE 2 TABLETS BY MOUTH 3 TIMES DAILY AS NEEDED FOR MODERATE PAIN 180 tablet 0   No current facility-administered medications for this  visit.    Facility-Administered Medications Ordered in Other Visits  Medication Dose Route Frequency Provider Last Rate Last Dose  . sodium chloride flush (NS) 0.9 % injection 10 mL  10 mL Intravenous PRN [place], Holli Humbles, NP   10 mL at 09/13/16 1146    Allergies:   Adhesive [tape]; Codeine; Zocor [simvastatin - high dose]; and Penicillins    Social History:  The patient  reports that she quit smoking about 30 years ago. Her smoking use included cigarettes. She started smoking about 45 years ago. She has a 15.00 pack-year smoking history. She has never used smokeless tobacco. She reports that she does not drink alcohol or use drugs.  Family History:  The patient's family history includes Alzheimer's disease (age of onset: 5) in her father; Cancer in her maternal grandfather; Cancer (age of onset: 82) in her mother; Diabetes in her brother and maternal grandmother.    ROS: All other systems are reviewed and negative. Unless otherwise mentioned in H&P    PHYSICAL EXAM: VS:  There were no vitals taken for this visit. , BMI There is no height or weight on file to calculate BMI. GEN: Well nourished, well developed, in no acute distress HEENT: normal Neck: no JVD, carotid bruits, or masses Cardiac: ***RRR; no murmurs, rubs, or gallops,no edema  Respiratory:  Clear to auscultation bilaterally, normal work of breathing GI: soft, nontender, nondistended, + BS MS: no deformity or atrophy Skin: warm and dry, no rash Neuro:  Strength and sensation are intact Psych: euthymic mood, full affect   EKG:  EKG {ACTION; IS/IS OVF:[id number]} ordered today. The ekg ordered today demonstrates ***   Recent Labs: 11/02/2017: B Natriuretic Peptide 41.6 10/01/2018: ALT 24; BUN 25; Creatinine, Ser 1.53; Hemoglobin 12.5; Platelets 306.0; Potassium 5.0; Sodium 135; TSH 3.81    Lipid Panel    Component Value Date/Time   CHOL 88 10/01/2018 1151   CHOL 196 08/02/2016 1317   TRIG 149.0 10/01/2018 1151    TRIG 169 (H) 08/02/2016 1317   HDL 40.60 10/01/2018 1151   HDL 60 08/02/2016 1317   CHOLHDL 2 10/01/2018 1151   VLDL 29.8 10/01/2018 1151   LDLCALC 18 10/01/2018 1151   LDLCALC 102 (H) 08/02/2016 1317   LDLDIRECT 136.0 08/20/2018 1142      Wt Readings from Last 3 Encounters:  10/01/18 220 lb (99.8 kg)  09/10/18 223 lb (101.2 kg)  09/07/18 277 lb 9.6 oz (125.9 kg)      Other studies Reviewed: Additional studies/ records that were reviewed today include: ***. Review of the above records demonstrates: ***   ASSESSMENT AND PLAN:  1.  ***   Current medicines are reviewed at length with the patient today.    Labs/ tests ordered today include: *** Phill Myron. [place] Pugh, ANP, [facility]   10/16/2018 9:16 AM    [place] [address] Office [phone] Fax [phone]

## 2018-10-17 ENCOUNTER — Ambulatory Visit: Payer: Medicare Other | Admitting: Adult Health

## 2018-10-17 NOTE — Telephone Encounter (Signed)
Called patient, it rang and sounded like someone answered but did not say anything. It then made a beep sound and disconnected me.

## 2018-10-19 ENCOUNTER — Ambulatory Visit: Payer: Self-pay | Admitting: *Deleted

## 2018-10-19 ENCOUNTER — Ambulatory Visit: Payer: Self-pay | Admitting: Internal Medicine

## 2018-10-19 ENCOUNTER — Other Ambulatory Visit: Payer: Self-pay

## 2018-10-19 ENCOUNTER — Encounter: Payer: Self-pay | Admitting: Family Medicine

## 2018-10-19 ENCOUNTER — Ambulatory Visit (INDEPENDENT_AMBULATORY_CARE_PROVIDER_SITE_OTHER): Payer: Medicare Other | Admitting: Family Medicine

## 2018-10-19 DIAGNOSIS — M62838 Other muscle spasm: Secondary | ICD-10-CM

## 2018-10-19 MED ORDER — SERTRALINE HCL 50 MG PO TABS: 50.0000 mg | ORAL_TABLET | Freq: Every day | ORAL | 1 refills | Status: DC

## 2018-10-19 MED ORDER — METHOCARBAMOL 500 MG PO TABS: 500.0000 mg | ORAL_TABLET | Freq: Four times a day (QID) | ORAL | 0 refills | Status: DC | PRN

## 2018-10-19 NOTE — Telephone Encounter (Signed)
Message from Berneta Levins sent at 10/19/2018 10:13 AM EDT   Summary: pain in chest   Pt called and left message on [place] on 10/18/2018. States that she is having pain in her chest when breathing. Pt states she can be reached at [phone].         Returned call to patient regarding the message that she is having pain in her upper back, shoulder blade area. She denies having fever, chest pain or shortness of breath. Whenever she takes a deep breath the pain does cause her to stop in her tracks. She said it really is not a pain but more like a discomfort.  She has not done anything to injure herself.  Has taken tramadol which she takes for something else. It did not help. Home care advice given to try warm and cool to the area alternately, and  taking Tylenol. Will check with her provider for recommendation of other medication she may be able to try. Pt was also advise that I will notify the office and that she can has Internet and would be able to do an e visit or virtual if necessary. Routing to flow at [facility] at [facility]. Pt also wanted her provider to know that her blood sugars have been running elevated: 397 on yesterday and 247 on Wednesday and have not checked it today. . Also advised patient to make sure she is drinking lots of water, getting exercise and avoiding the white foods. Pt voiced understanding and she is trying to do that. Also advised to call her endocrinologist regarding her medication and blood sugar readings. Pt has not traveled in the last 30 days.  email address: [email or web address]   Reason for Disposition . [1] MODERATE back pain (e.g., interferes with normal activities) AND [2] present > 3 days  Answer Assessment - Initial Assessment Questions 1. ONSET: "When did the pain begin?"      2 days ago 2. LOCATION: "Where does it hurt?" (upper, mid or lower back)      Shoulder blades on the right side 3. SEVERITY: "How bad is the pain?"   (e.g., Scale 1-10; mild, moderate, or severe)   - MILD (1-3): doesn't interfere with normal activities    - MODERATE (4-7): interferes with normal activities or awakens from sleep    - SEVERE (8-10): excruciating pain, unable to do any normal activities      Uncomfortable at #7 4. PATTERN: "Is the pain constant?" (e.g., yes, no; constant, intermittent)      Constant sometimes not as bad as other 5. RADIATION: "Does the pain shoot into your legs or elsewhere?"     no 6. CAUSE:  "What do you think is causing the back pain?"       Not sure 7. BACK OVERUSE:  "Any recent lifting of heavy objects, strenuous work or exercise?"     no 8. MEDICATIONS: "What have you taken so far for the pain?" (e.g., nothing, acetaminophen, NSAIDS)     Tramadol does not help 9. NEUROLOGIC SYMPTOMS: "Do you have any weakness, numbness, or problems with bowel/bladder control?"     no 10. OTHER SYMPTOMS: "Do you have any other symptoms?" (e.g., fever, abdominal pain, burning with urination, blood in urine)       no 11. PREGNANCY: "Is there any chance you are pregnant?" (e.g., yes, no; LMP)       n/a  Protocols used: BACK PAIN-A-AH

## 2018-10-19 NOTE — Progress Notes (Signed)
Virtual Visit via Video Note  I connected with [name] on 10/19/18 at  1:45 PM EDT by a video enabled telemedicine application and verified that I am speaking with the correct person using two identifiers.   I discussed the limitations of evaluation and management by telemedicine and the availability of in person appointments. The patient expressed understanding and agreed to proceed.  History of Present Illness: Pt is at home c/o shoulder blade pain on the right side  Pt with putting pressure in muscles r shoulder blade---- some pain with movement No known injury Tramadol and heat has helped    Observations/Objective: No fever resp normal Pt is in no distress  Assessment and Plan: 1. Muscle spasm Warm compresses/ bath Muscle relaxer / tramadol prn rto if symptoms worsen or no better  - methocarbamol (ROBAXIN) 500 MG tablet; Take 1 tablet (500 mg total) by mouth every 6 (six) hours as needed for muscle spasms.  Dispense: 45 tablet; Refill: 0  Follow Up Instructions:    I discussed the assessment and treatment plan with the patient. The patient was provided an opportunity to ask questions and all were answered. The patient agreed with the plan and demonstrated an understanding of the instructions.   The patient was advised to call back or seek an in-person evaluation if the symptoms worsen or if the condition fails to improve as anticipated.  I provided 15 minutes of non-face-to-face time during this encounter.   Ann Held, DO

## 2018-10-19 NOTE — Telephone Encounter (Signed)
This is usually not successful. Please offer regular insulin (vials), which is the cheapest.

## 2018-10-19 NOTE — Assessment & Plan Note (Signed)
Muscle relaxer per orders Pt has tramadol at home Moist heat, showers etc Call if symptoms worsens or do not improve

## 2018-10-19 NOTE — Telephone Encounter (Signed)
Spoke to patient and let her know message from Dr. Cruzita Lederer, patient said it does not matter what insulin it is, it will all be too expensive and she wants me to contact her insurance company to get tier reductions.  Please advise.

## 2018-10-19 NOTE — Telephone Encounter (Signed)
Patient scheduled for web visit.

## 2018-10-20 IMAGING — MG 2D DIGITAL SCREENING BILATERAL MAMMOGRAM WITH CAD AND ADJUNCT TO
4 series · 4 of 8 positions shown · non-contrast
Comparison: Previous exam(s).

CLINICAL DATA: Screening.

EXAM:
2D DIGITAL SCREENING BILATERAL MAMMOGRAM WITH CAD AND ADJUNCT TOMO

[R CC]
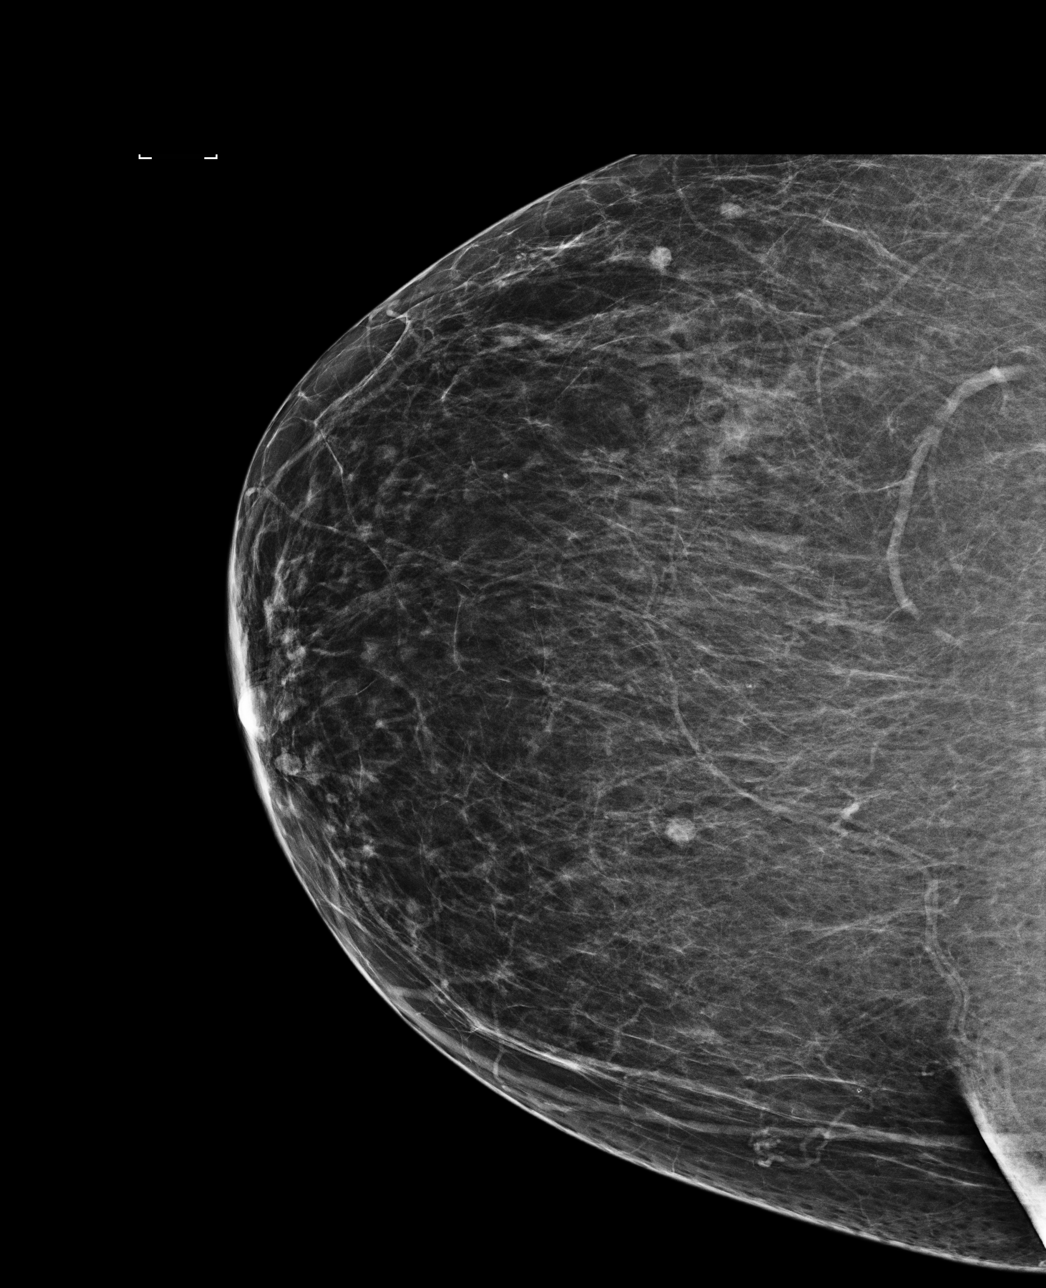

[L MLO]
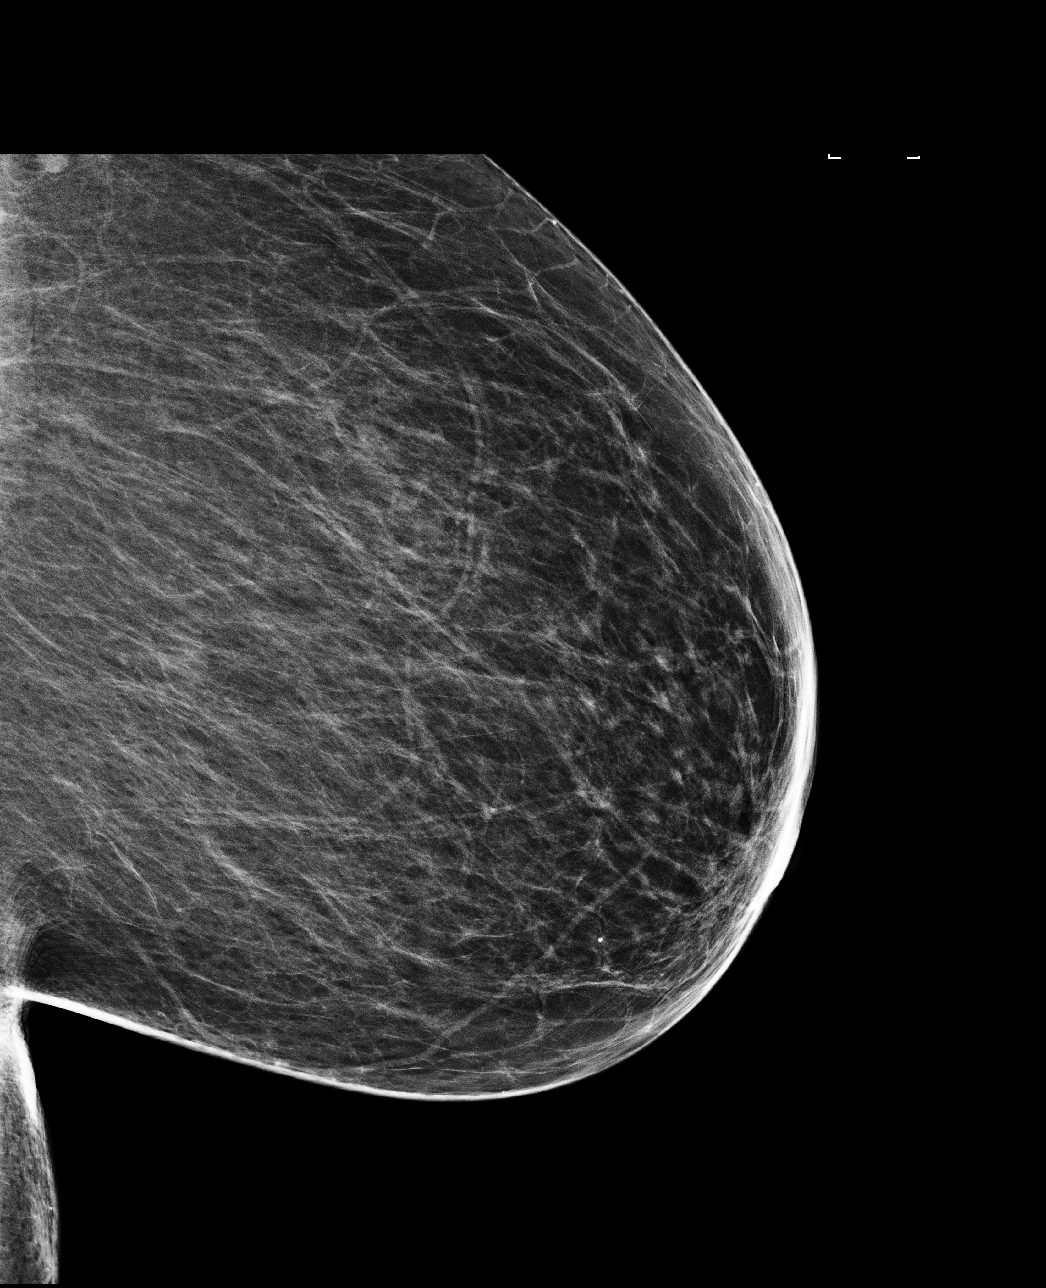

[R MLO]
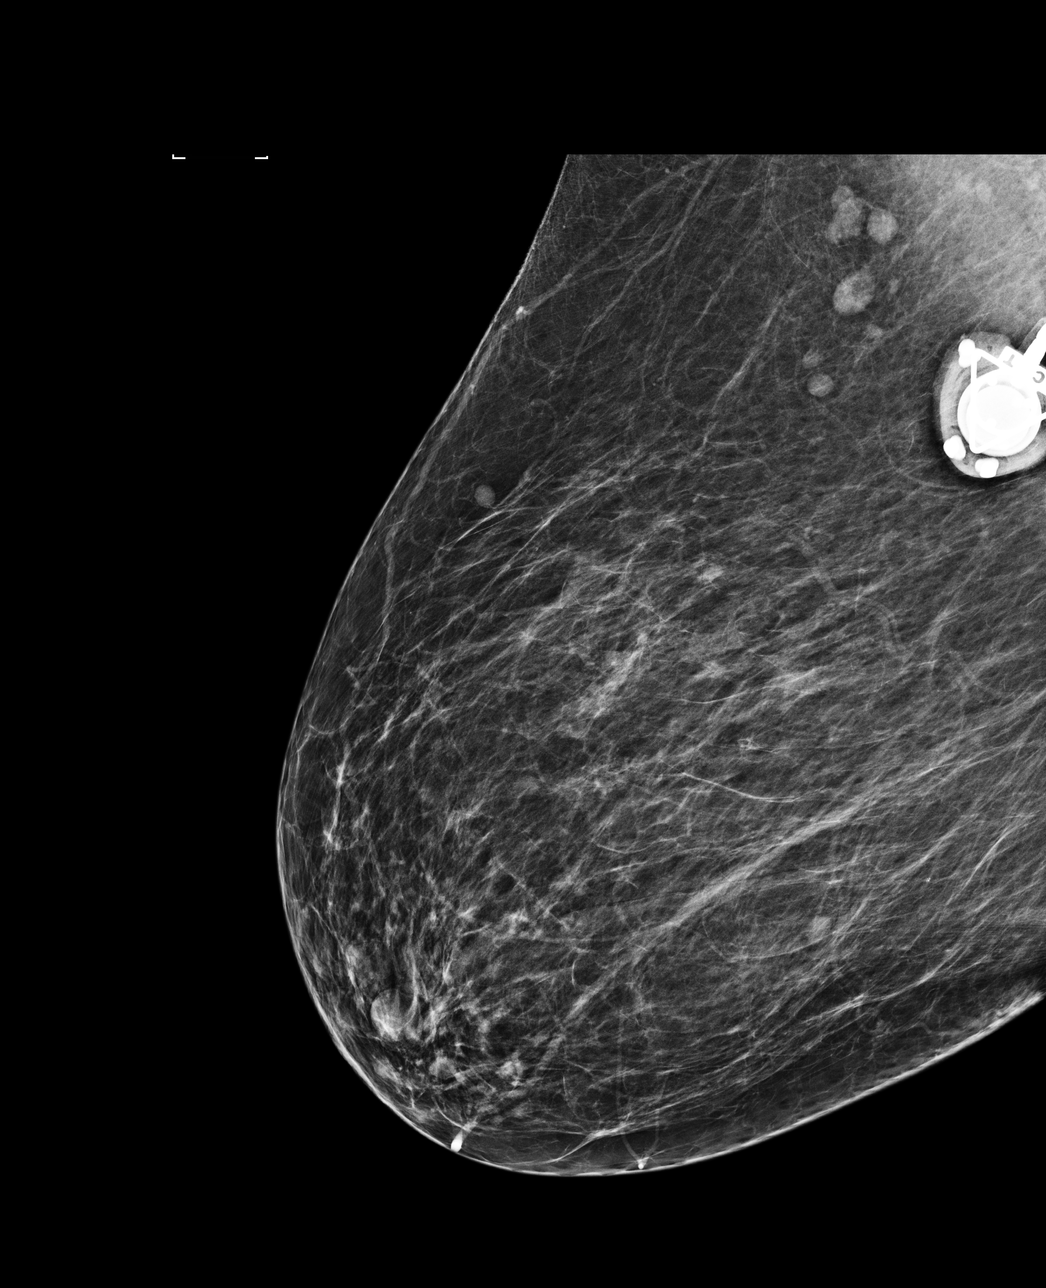

[L CC tomo · tomo slice 27/54.0]
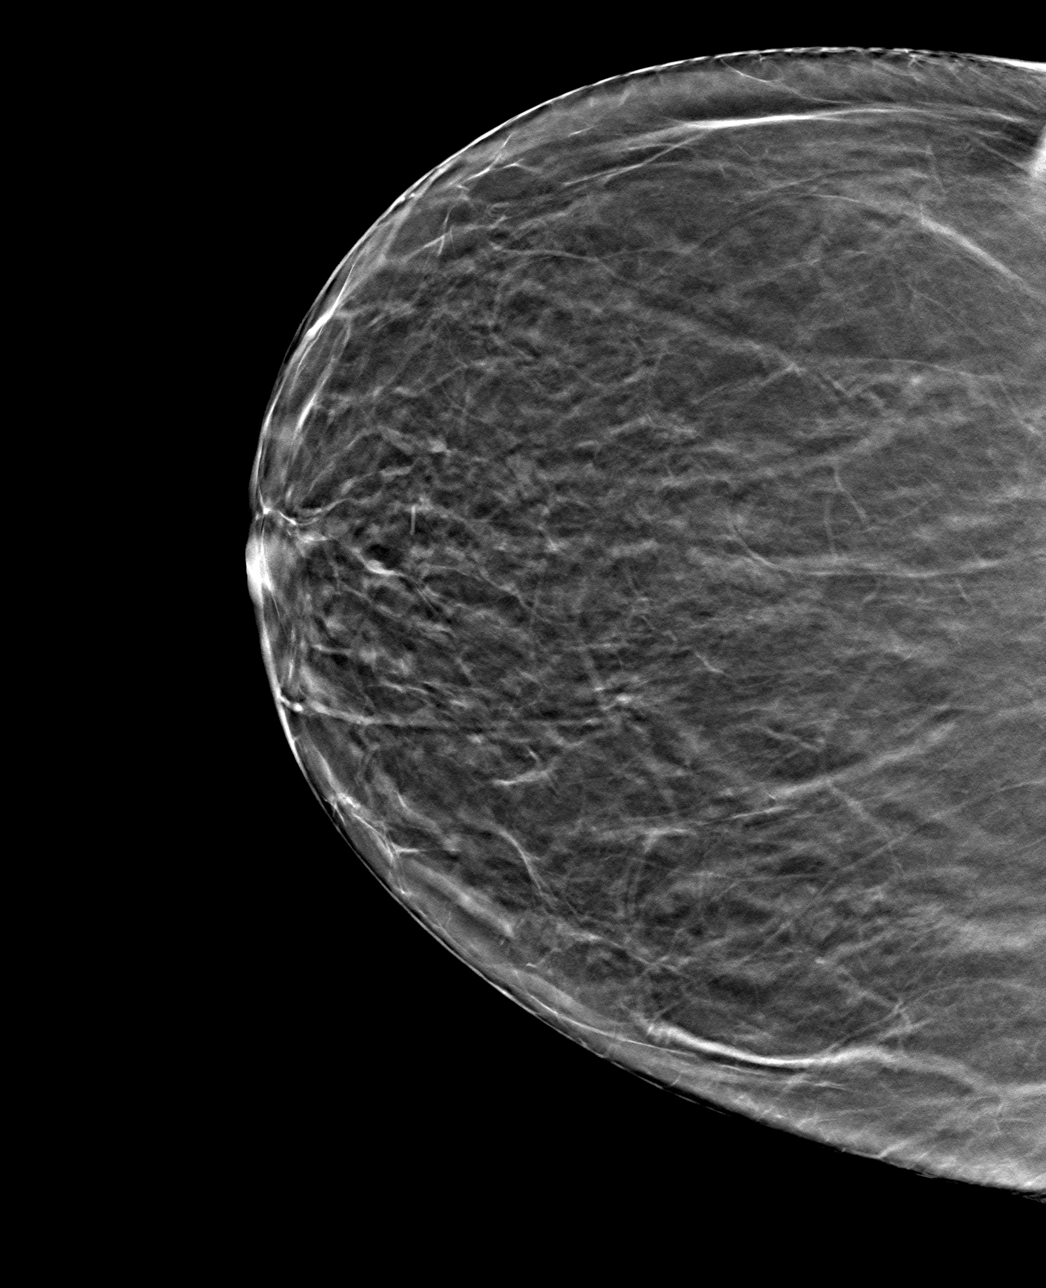

[4 of 8 positions shown; findings below may reference images not displayed]

ACR Breast Density Category b: There are scattered areas of
fibroglandular density.
FINDINGS: There are no findings suspicious for malignancy. Images were
processed with CAD.
IMPRESSION: No mammographic evidence of malignancy. A result letter of this
screening mammogram will be mailed directly to the patient.

RECOMMENDATION:
Screening mammogram in one year. (Code:97-6-RS4)

BI-RADS CATEGORY  1: Negative.

## 2018-10-24 NOTE — Telephone Encounter (Signed)
ReliOn R (this is the [facility] type - either Humulin or Novolin)

## 2018-10-24 NOTE — Telephone Encounter (Signed)
Which regular insulin?

## 2018-10-25 ENCOUNTER — Ambulatory Visit (INDEPENDENT_AMBULATORY_CARE_PROVIDER_SITE_OTHER): Payer: Medicare Other | Admitting: Internal Medicine

## 2018-10-25 DIAGNOSIS — E042 Nontoxic multinodular goiter: Secondary | ICD-10-CM | POA: Diagnosis not present

## 2018-10-25 DIAGNOSIS — E1121 Type 2 diabetes mellitus with diabetic nephropathy: Secondary | ICD-10-CM | POA: Diagnosis not present

## 2018-10-25 NOTE — Progress Notes (Signed)
Patient ID: [name], female   DOB: 08/09/1950, 68 y.o.   MRN: [id number]  Patient location: [facility] My location: Office  Referring Provider: Carollee Herter, Alferd Apa, DO  I connected with the patient on 10/25/18 at  1:13 PM EDT by a video enabled telemedicine application and verified that I am speaking with the correct person.   I discussed the limitations of evaluation and management by telemedicine and the availability of in person appointments. The patient expressed understanding and agreed to proceed.   Details of the encounter are shown below.  HPI  [name] is a 68 y.o.-year-old female, presenting for f/u for MNG and DM2, uncontrolled, non-insulin dependent, with complications (CAD  - s/p NSTEMI 08/2018; cerebrovascular ds - s/p CVA; CKD stage 3, PN).  Last visit 2 months ago. PCP: [name].  She had a STEMI in 08/2018  >> had to have stents placed.  HbA1c was very high while in the hospital.  At that time, she returned to see me after 1 year and a half from the last appointment.  MNG -History of RAI treatment for an overactive thyroid nodule in 2011   Review thyroid imaging reports:   11/24/2009: 1. Normal (25%) 24 hour radioiodine uptake by the thyroid gland. 2. Autonomous nodule superiorly in the right lobe with relative suppression of activity elsewhere in the gland. There is a possible cold nodule in the mid right lobe, corresponding with the dominant nodule seen on prior CT.  01/11/2010: RAI Tx for the R autonomous thyroid nodule  09/28/2014 Thyroid U/S: 6 larger nodules, 2 of them of 3 cm   09/21/2015 Thyroid U/S: Right thyroid lobe: 4.4 x 2.5 x 2.5 cm.  - 12 x 13 x 10 mm upper pole hypoechoic nodule with calcification. Previously, this measured up to 2.0 cm.  - Complex mid lobe nodule measures 25 x 22 x 18 mm and previously measured up to 35 mm.  - Smaller lower pole hypoechoic nodule measures 10 mm.  Left thyroid lobe: 5.2 x 1.6 x 2.0 cm.  -  Mid lobe nodule measures 29 x 17 x 15 mm. Previously, it measured up to 30 mm.  - 8 mm upper pole complex nodule.  - Lower pole hypoechoic nodule with calcification measures 18 x 15 x 14 mm. Previously, it measured up to 20 mm.  Isthmus Thickness: 2 mm.  5 mm nodule.  Lymphadenopathy: None visualized.  IMPRESSION: Multiple bilateral nodules are smaller than on prior studies. Stability or regression over 2 years supports benign etiology.  02/21/2017 Thyroid U/S: Nodule # 1:Right; Superior Maximum size: 1.2 cm; Other 2 dimensions: 1.1 x 1.0 cm, previously, 1.3 x 1.2 x 1.0 cm Composition: solid/almost completely solid (2) Echogenicity: hypoechoic (2) Echogenic foci: macrocalcifications (1) *Given size (>/= 1 - 1.4 cm) and appearance, a follow-up ultrasound in 1 year should be considered based on TI-RADS criteria.  Nodule # 2: Right; Mid Maximum size: 2.6 cm; Other 2 dimensions: 2.2 x 1.8 cm, previously, 2.5 x 1.8 x 2.2 cm Composition: solid/almost completely solid (2) Echogenicity: hypoechoic (2) **Given size (>/= 1.5 cm) and appearance, fine needle aspiration of this moderately suspicious nodule should be considered based on TI-RADS criteria.  Nodule # 3: Right; Inferior Maximum size: 1.2 cm; Other 2 dimensions: 0.6 x 0.9 cm, previously, 1.1 x 1.0 x 0.9 cm Composition: solid/almost completely solid (2) Echogenicity: hyperechoic (1) Given size (<1.4 cm) and appearance, this nodule does NOT meet TI-RADS criteria for biopsy or dedicated  follow-up.  Nodule # 4: Left; Mid Maximum size: 2.7 cm; Other 2 dimensions: 1.4 x 1.9 cm, previously, 2.9 x 1.7 x 1.5 cm Composition: solid/almost completely solid (2) Echogenicity: hypoechoic (2) **Given size (>/= 1.5 cm) and appearance, fine needle aspiration of this moderately suspicious nodule should be considered based on TI-RADS criteria.  Nodule # 5: Left; Inferior Maximum size: 1.7 cm; Other 2 dimensions: 1.3 x 1.3 cm, previously,  1.8 x 1.4 x 1.5 cm Composition: solid/almost completely solid (2) Echogenicity: hypoechoic (2) **Given size (>/= 1.5 cm) and appearance, fine needle aspiration of this moderately suspicious nodule should be considered based on TI-RADS criteria.  IMPRESSION: Multiple bilateral nodules are all stable in appearance and size. Nodules 2, 4, and 5 continue to meet criteria for biopsy. Nodule 1 meets criteria for annual follow-up.  I previously suggested a biopsy of the dominant 3 nodules, but she refused, in favor of just following the nodules by ultrasound and clinically. However, she also refused a new ultrasound at last visit.  Reviewed patient's TFTs: Lab Results  Component Value Date   TSH 3.81 10/01/2018   TSH 3.72 09/07/2018   TSH 3.061 05/13/2016   TSH 1.241 04/01/2016   TSH 4.112 01/15/2016   Previously:  TSH 1.348 10/12/2012   TSH 2.559 03/11/2011   TSH 3.155 01/05/2011   TSH 1.823 09/24/2010   TSH 1.925 06/25/2010   Pt denies: - feeling nodules in neck - hoarseness - dysphagia - choking - SOB with lying down Pt does not have a FH of thyroid ds. No FH of thyroid cancer. No h/o radiation tx to head or neck.  No seaweed or kelp. No recent contrast studies. No herbal supplements. No Biotin use. No recent steroids use.   She also has a history of stomach cancer - sx 2012.  DM2  Last HbA1c: Lab Results  Component Value Date   HGBA1C 13.0 (H) 08/28/2018   HGBA1C >14.0 (H) 08/20/2018   HGBA1C 12.5 (H) 04/09/2018   She is on: - Metformin 500 mg twice a day She could not start [place] or other analog long-acting insulin or Jardiance due to price Stopped Amaryl 4 mg 2x a day.  She checks sugars 1x a day: - am: n/c >> 247-300 - 2h after b'fast: 120s >> n/c - lunch: n/c - 2h after lunch: n/c - dinner: n/c - 2h after dinner: n/c - bedtime: n/c  + CKD; last BUN/Cr: Lab Results  Component Value Date   BUN 25 (H) 10/01/2018   Lab Results  Component Value Date    CREATININE 1.53 (H) 10/01/2018   Lab Results  Component Value Date   GFRAA 49 (L) 08/30/2018   GFRAA 55 (L) 08/29/2018   GFRAA 57 (L) 08/28/2018   GFRAA 48 (L) 08/27/2018   GFRAA 48 (L) 08/12/2018   GFRAA 45 (L) 06/04/2018   GFRAA >60 11/07/2017   GFRAA >60 11/05/2017   GFRAA 48 (L) 11/04/2017   GFRAA 58 (L) 11/03/2017   + HL; latest lipid panel great: Lab Results  Component Value Date   CHOL 88 10/01/2018   HDL 40.60 10/01/2018   LDLCALC 18 10/01/2018   LDLDIRECT 136.0 08/20/2018   TRIG 149.0 10/01/2018   CHOLHDL 2 10/01/2018  She is not on a statin.  She is on ASA 81.  She has peripheral neuropathy and is on Neurontin.  ROS: Constitutional: no weight gain/no weight loss, no fatigue, no subjective hyperthermia, no subjective hypothermia Eyes: no blurry vision, no xerophthalmia ENT: no  sore throat, + see HPI Cardiovascular: no CP/no SOB/no palpitations/no leg swelling Respiratory: no cough/no SOB/no wheezing Gastrointestinal: no N/no V/no D/no C/no acid reflux Musculoskeletal: no muscle aches/no joint aches Skin: no rashes, no hair loss Neurological: no tremors/+ numbness/+ tingling/no dizziness  I reviewed pt's medications, allergies, PMH, social hx, family hx, and changes were documented in the history of present illness. Otherwise, unchanged from my initial visit note.  Past Medical History:  Diagnosis Date  . Asthma   . Degenerative joint disease   . Diabetes mellitus   . Dyspnea   . GERD (gastroesophageal reflux disease)   . Hyperlipidemia    no longer on medication for this  . Hypertension    patient denies  . Myocardial infarction ([place]) 08/2018  . Neuropathy    bilateral hands and feet  . Sickle cell trait ([place])   . Sleep apnea   . stomach ca dx'd 2010   chemo/xrt comp 05/2009  . TIA (transient ischemic attack) 07/2015   Past Surgical History:  Procedure Laterality Date  . APPENDECTOMY    . CESAREAN SECTION    . COLON SURGERY      colonscopy  . COLONOSCOPY WITH PROPOFOL N/A 06/01/2016   Procedure: COLONOSCOPY WITH PROPOFOL;  Surgeon: Wilford Corner, MD;  Location: [facility] ENDOSCOPY;  Service: Endoscopy;  Laterality: N/A;  . CORONARY STENT INTERVENTION N/A 08/29/2018   Procedure: CORONARY STENT INTERVENTION;  Surgeon: Wellington Hampshire, MD;  Location: [place] CV LAB;  Service: Cardiovascular;  Laterality: N/A;  . CORONARY/GRAFT ACUTE MI REVASCULARIZATION N/A 08/27/2018   Procedure: Coronary/Graft Acute MI Revascularization;  Surgeon: Leonie Man, MD;  Location: [place] CV LAB;  Service: Cardiovascular;  Laterality: N/A;  . ESOPHAGOGASTRODUODENOSCOPY N/A 10/15/2012   Procedure: ESOPHAGOGASTRODUODENOSCOPY (EGD);  Surgeon: Lear Ng, MD;  Location: [facility] ENDOSCOPY;  Service: Endoscopy;  Laterality: N/A;  . ESOPHAGOGASTRODUODENOSCOPY N/A 01/15/2016   Procedure: ESOPHAGOGASTRODUODENOSCOPY (EGD);  Surgeon: Ronald Lobo, MD;  Location: [facility] ENDOSCOPY;  Service: Endoscopy;  Laterality: N/A;  . HERNIA REPAIR    . LEFT HEART CATH AND CORONARY ANGIOGRAPHY N/A 08/27/2018   Procedure: LEFT HEART CATH AND CORONARY ANGIOGRAPHY;  Surgeon: Leonie Man, MD;  Location: [place] CV LAB;  Service: Cardiovascular;  Laterality: N/A;  . SHOULDER SURGERY     Left  . TOTAL HIP ARTHROPLASTY     Right  . TOTAL KNEE ARTHROPLASTY     Left   History   Social History  . Marital Status: Divorced    Spouse Name: N/A  . Number of Children: N/A   Occupational History  . Not on file.   Social History Main Topics  . Smoking status: Former Smoker -- 1.00 packs/day for 15 years    Types: Cigarettes    Start date: 09/27/1973    Quit date: 10/15/1988  . Smokeless tobacco: Never Used     Comment: quit smoking 14 years ago  . Alcohol Use: No  . Drug Use: No   Current Outpatient Medications on File Prior to Visit  Medication Sig Dispense Refill  . albuterol (PROVENTIL HFA;VENTOLIN HFA) 108 (90 Base) MCG/ACT inhaler Inhale 2 puffs  into the lungs every 6 (six) hours as needed for wheezing or shortness of breath. 1 Inhaler 2  . albuterol (PROVENTIL) (2.5 MG/3ML) 0.083% nebulizer solution Take 3 mLs (2.5 mg total) by nebulization every 6 (six) hours as needed for wheezing or shortness of breath. 75 mL 12  . aspirin 81 MG tablet Take 1 tablet (81 mg  total) by mouth at bedtime. 90 tablet 3  . clopidogrel (PLAVIX) 75 MG tablet Take 1 tablet (75 mg total) by mouth daily. 90 tablet 3  . empagliflozin (JARDIANCE) 10 MG TABS tablet Take 10 mg by mouth daily. 30 tablet 3  . fluticasone (FLONASE) 50 MCG/ACT nasal spray Place 2 sprays into both nostrils daily. 16 g 5  . gabapentin (NEURONTIN) 800 MG tablet Take 1 tablet (800 mg total) by mouth 3 (three) times daily. 270 tablet 1  . glimepiride (AMARYL) 4 MG tablet TAKE 1 TABLET BY MOUTH TWICE A DAY 60 tablet 1  . glucose blood (ONETOUCH VERIO) test strip Use as instructed to check blood sugar 3 times a day 300 each 12  . Insulin Degludec (TRESIBA FLEXTOUCH) 200 UNIT/ML SOPN Inject 20 Units into the skin daily. 3 pen 3  . ipratropium-albuterol (DUONEB) 0.5-2.5 (3) MG/3ML SOLN Take 3 mLs by nebulization every 6 (six) hours as needed. (Patient taking differently: Take 3 mLs by nebulization every 6 (six) hours as needed (sob and wheezing). ) 360 mL 2  . isosorbide mononitrate (IMDUR) 30 MG 24 hr tablet Take 0.5 tablets (15 mg total) by mouth daily. 45 tablet 3  . metFORMIN (GLUCOPHAGE) 500 MG tablet Take 1 tablet (500 mg total) by mouth 2 (two) times daily with a meal. 180 tablet 3  . methocarbamol (ROBAXIN) 500 MG tablet Take 1 tablet (500 mg total) by mouth every 6 (six) hours as needed for muscle spasms. 45 tablet 0  . metoprolol tartrate (LOPRESSOR) 25 MG tablet Take 1 tablet (25 mg total) by mouth 2 (two) times daily. 180 tablet 3  . Multiple Vitamin (MULTIVITAMIN WITH MINERALS) TABS tablet Take 1 tablet by mouth daily. 90 tablet 3  . nitroGLYCERIN (NITROSTAT) 0.4 MG SL tablet Place 1  tablet (0.4 mg total) under the tongue every 5 (five) minutes as needed. 25 tablet 12  . pantoprazole (PROTONIX) 40 MG tablet Take 1 tablet (40 mg total) by mouth daily. 30 tablet 3  . rosuvastatin (CRESTOR) 40 MG tablet Take 1 tablet (40 mg total) by mouth at bedtime. 90 tablet 3  . sertraline (ZOLOFT) 50 MG tablet Take 1 tablet (50 mg total) by mouth daily. 90 tablet 1  . sucralfate (CARAFATE) 1 g tablet Take 1 tablet (1 g total) by mouth 2 (two) times daily. 60 tablet 3  . traMADol (ULTRAM) 50 MG tablet TAKE 2 TABLETS BY MOUTH 3 TIMES DAILY AS NEEDED FOR MODERATE PAIN 180 tablet 0   Current Facility-Administered Medications on File Prior to Visit  Medication Dose Route Frequency Provider Last Rate Last Dose  . sodium chloride flush (NS) 0.9 % injection 10 mL  10 mL Intravenous PRN [place], Holli Humbles, NP   10 mL at 09/13/16 1146   Allergies  Allergen Reactions  . Adhesive [Tape] Other (See Comments)    Burn Skin  . Codeine Other (See Comments)    paranoid  . Zocor [Simvastatin - High Dose] Other (See Comments)    Muscle spasms  . Penicillins Rash    Has patient had a PCN reaction causing immediate rash, facial/tongue/throat swelling, SOB or lightheadedness with hypotension: Yes Has patient had a PCN reaction causing severe rash involving mucus membranes or skin necrosis: No Has patient had a PCN reaction that required hospitalization does not remember.  Has patient had a PCN reaction occurring within the last 10 years: No If all of the above answers are "NO", then may proceed with Cephalosporin use.  Family History  Problem Relation Age of Onset  . Diabetes Brother   . Alzheimer's disease Father 43  . Cancer Mother 41  . Diabetes Maternal Grandmother   . Cancer Maternal Grandfather        lung  . Suicidality Neg Hx   . Depression Neg Hx   . Dementia Neg Hx   . Anxiety disorder Neg Hx    PE: There were no vitals taken for this visit. Wt Readings from Last 3 Encounters:   10/01/18 220 lb (99.8 kg)  09/10/18 223 lb (101.2 kg)  09/07/18 277 lb 9.6 oz (125.9 kg)   Constitutional:  in NAD  The physical exam was not performed (virtual visit).  ASSESSMENT: 1. MNG  2. DM2, insulin-independent, uncontrolled, with complications - CAD  - s/p NSTEMI 08/2018 - cerebrovascular ds - s/p CVA - CKD stage 3 - PN  3.  Obesity  PLAN: 1. MNG  -Reviewed the latest report of her thyroid ultrasound from 2018: Nodules are either stable or smaller.  She refused a biopsy in the past and was also reticent to have another thyroid ultrasound.   -No neck compression symptoms -TFTs were reviewed and these were normal in the past.  2. DM2, insulin-independent, uncontrolled -At last visit, she was not checking sugars at home and I strongly advised her to start.  However, HbA1c was very high, and I suggested to start Jardiance, and also long-acting insulin.  We sent Tyler Aas to the pharmacy and we tried almost every other analog long-acting insulin on the market, but this was not affordable. -At this visit, sugars are still very high as she was not able to get the insulin.  However, she got a little bit of help and she will be able to pick up [place] tomorrow.  I again advised her to start 20 units of Tresiba daily and increase the dose as needed to improve the sugars in the morning lower than 140. - I advised her to: Patient Instructions   Please continue: - Metformin 500 mg 2x a day with meals  Please start: - Tresiba U200 20 units daily If sugars in am are >140 in 4 days increase to 24 units. If sugars in am are >140 in 4 days increase to 28 units.  Send me the sugars in 2 weeks.  Please return in 3 months with your sugar log.  - We will check her HbA1c when she returns to the clinic. - continue checking sugars at different times of the day - check 2x a day, rotating checks - Return to clinic in 3 mo with sugar log   3.  Obesity -Before last visit, she gained 15  pounds, lost 10-12 lbs since last OV. - we cannot use a SGLT2 inh 2/2 price  Philemon Kingdom, MD PhD [facility]

## 2018-10-25 NOTE — Patient Instructions (Addendum)
  Please continue: - Metformin 500 mg 2x a day with meals  Please start: - Tresiba U200 20 units daily If sugars in am are >140 in 4 days increase to 24 units. If sugars in am are >140 in 4 days increase to 28 units.  Send me the sugars in 2 weeks.  Please return in 3 months with your sugar log.

## 2018-10-25 NOTE — Telephone Encounter (Signed)
See visit today.

## 2018-11-08 ENCOUNTER — Ambulatory Visit: Payer: Self-pay | Admitting: Family Medicine

## 2018-11-08 NOTE — Telephone Encounter (Signed)
FYI

## 2018-11-08 NOTE — Telephone Encounter (Signed)
Pt. Reports she fell down 4 steps at her daughters. Did not hit her head. Hurt her right knee and across her shoulders. No cuts or bruises. Will try Motrin after checking with her pharmacist. States she can't tolerate ice - will try heat. Able to ambulate.States "my knee was already bothering me before this happened." Will call back if discomfort increases. Reason for Disposition . [1] Minor injury or pain from twisting or over-stretching AND [2] walking normally  Answer Assessment - Initial Assessment Questions 1. MECHANISM: "How did the injury happen?" (e.g., twisting injury, direct blow)      Fell down 4 steps 2. ONSET: "When did the injury happen?" (Minutes or hours ago)      12 noon 3. LOCATION: "Where is the injury located?"      Right knee and across shoulders 4. APPEARANCE of INJURY: "What does the injury look like?"  (e.g., deformity of leg)     No cuts or bruises 5. SEVERITY: "Can you put weight on that leg?" "Can you walk?"      Can walk, back of knee hurts 6. SIZE: For cuts, bruises, or swelling, ask: "How large is it?" (e.g., inches or centimeters)      No cuts 7. PAIN: "Is there pain?" If so, ask: "How bad is the pain?"  (Scale 1-10; or mild, moderate, severe)     8 8. TETANUS: For any breaks in the skin, ask: "When was the last tetanus booster?"     Unsure 9. OTHER SYMPTOMS: "Do you have any other symptoms?"      Hurts across shoulders 10. PREGNANCY: "Is there any chance you are pregnant?" "When was your last menstrual period?"       No  Protocols used: LEG INJURY-A-AH

## 2018-11-08 NOTE — Telephone Encounter (Signed)
Please call ms Caddell tomorrow--- just make sure she is ok

## 2018-11-09 ENCOUNTER — Encounter: Payer: Self-pay | Admitting: Family Medicine

## 2018-11-09 ENCOUNTER — Ambulatory Visit: Payer: Self-pay | Admitting: *Deleted

## 2018-11-09 DIAGNOSIS — E559 Vitamin D deficiency, unspecified: Secondary | ICD-10-CM | POA: Insufficient documentation

## 2018-11-09 NOTE — Telephone Encounter (Signed)
Pt called stating that her blood sugar this morning was 58 at 0750;  she says that her CBG is normally in the 300's and 139 on 11/08/2018; the pt says that she takes metformin and triseba; the pt last ate at 2100 on 11/08/2018 and took triseba at 1000, and metformin at 2300; when rechecked, her cbg was 87 at 0829 after 4 oz of orange juice; recommendations made per nurse triage protocol; the pt's diabetes is managed by Dr Cruzita Lederer; conference call initiated with Lenna Sciara at [facility]; the pt says that her am blood sugar was 139, lunch 200's, evening 319; Melissa will notify Dr Barbarann Ehlers, and call the pt back; the pt is normally seen by Dr Etter Sjogren, [facility]; will route to office for notification of this encounter. Reason for Disposition . [1] Blood glucose < 70  mg/dL (3.9 mmol/L) or symptomatic, now improved with Care Advice AND [2] cause unknown  Answer Assessment - Initial Assessment Questions 1. SYMPTOMS: "What symptoms are you concerned about?"     Low blood sugar  2. ONSET:  "When did the symptoms start?"   11/09/2018 3. BLOOD GLUCOSE: "What is your blood glucose level?"      58 4. USUAL RANGE: "What is your blood glucose level usually?" (e.g., usual fasting morning value, usual evening value)     Morning fasting 347 evening 400 5. TYPE 1 or 2:  "Do you know what type of diabetes you have?"  (e.g., Type 1, Type 2, Gestational; doesn't know)      Type 2 6. INSULIN: "Do you take insulin?" "What type of insulin(s) do you use? What is the mode of delivery? (syringe, pen (e.g., injection or  pump)     Injection triseba 7. DIABETES PILLS: "Do you take any pills for your diabetes?"     metformin 8. OTHER SYMPTOMS: "Do you have any symptoms?" (e.g., fever, frequent urination, difficulty breathing, vomiting)    Light headed  9. LOW BLOOD GLUCOSE TREATMENT: "What have you done so far to treat the low blood glucose level?"   4 oz orange juice 10. FOOD: "When did you last eat or drink?"  11/08/2018 at 2100 11. ALONE: "Are you alone right now or is someone with you?"        yes 12. PREGNANCY: "Is there any chance you are pregnant?" "When was your last menstrual period?"       no  Protocols used: DIABETES - LOW BLOOD SUGAR-A-AH

## 2018-11-09 NOTE — Telephone Encounter (Signed)
Spoke to [name] regarding her blood sugar.  Yesterday morning 139, noon in the 200's and before bed 319. She took her normal dose of Metformin last night and this morning at 7:50 Am her sugar was 58. After 4 oz orange juice it came up to 87 at 8:29.  Patient takes her Tresiba at 10:00 AM.  Please advise.

## 2018-11-12 NOTE — Telephone Encounter (Signed)
Left message on machine to call back with status or if she likes to make virtual visit.

## 2018-11-13 ENCOUNTER — Other Ambulatory Visit: Payer: Self-pay | Admitting: Family Medicine

## 2018-11-13 DIAGNOSIS — G894 Chronic pain syndrome: Secondary | ICD-10-CM

## 2018-11-13 MED ORDER — TRAMADOL HCL 50 MG PO TABS: ORAL_TABLET | ORAL | 0 refills | Status: DC

## 2018-11-13 NOTE — Telephone Encounter (Signed)
Patient reports she is doing ok after recent fall, living with her daughter for now.  She is requesting a refill on tramadol 50 mg.  [facility] [address] [facility].

## 2018-11-13 NOTE — Telephone Encounter (Signed)
Sent in

## 2018-11-15 ENCOUNTER — Other Ambulatory Visit: Payer: Self-pay

## 2018-11-15 ENCOUNTER — Ambulatory Visit: Payer: Self-pay | Admitting: Hematology & Oncology

## 2018-11-16 ENCOUNTER — Telehealth: Payer: Self-pay

## 2018-11-16 NOTE — Telephone Encounter (Signed)
PA approved through 07/25/2019.

## 2018-11-16 NOTE — Telephone Encounter (Signed)
PA initiated via Covermymeds; KEY: [id number]. Awaiting determination.

## 2018-11-22 DIAGNOSIS — I129 Hypertensive chronic kidney disease with stage 1 through stage 4 chronic kidney disease, or unspecified chronic kidney disease: Secondary | ICD-10-CM | POA: Diagnosis not present

## 2018-11-22 DIAGNOSIS — E119 Type 2 diabetes mellitus without complications: Secondary | ICD-10-CM | POA: Diagnosis not present

## 2018-11-22 DIAGNOSIS — N183 Chronic kidney disease, stage 3 (moderate): Secondary | ICD-10-CM | POA: Diagnosis not present

## 2018-11-22 DIAGNOSIS — I2510 Atherosclerotic heart disease of native coronary artery without angina pectoris: Secondary | ICD-10-CM | POA: Diagnosis not present

## 2018-11-30 ENCOUNTER — Ambulatory Visit: Payer: Self-pay | Admitting: Internal Medicine

## 2018-12-03 ENCOUNTER — Ambulatory Visit: Payer: Self-pay | Admitting: Internal Medicine

## 2018-12-11 ENCOUNTER — Other Ambulatory Visit: Payer: Self-pay

## 2018-12-11 MED ORDER — INSULIN PEN NEEDLE 33G X 5 MM MISC: 1.0000 | Freq: Every day | 11 refills | Status: DC

## 2018-12-12 DIAGNOSIS — E1159 Type 2 diabetes mellitus with other circulatory complications: Secondary | ICD-10-CM | POA: Diagnosis not present

## 2018-12-12 DIAGNOSIS — Z794 Long term (current) use of insulin: Secondary | ICD-10-CM | POA: Diagnosis not present

## 2018-12-12 DIAGNOSIS — G8929 Other chronic pain: Secondary | ICD-10-CM | POA: Diagnosis not present

## 2018-12-12 DIAGNOSIS — E1142 Type 2 diabetes mellitus with diabetic polyneuropathy: Secondary | ICD-10-CM | POA: Diagnosis not present

## 2018-12-12 DIAGNOSIS — I4891 Unspecified atrial fibrillation: Secondary | ICD-10-CM | POA: Diagnosis not present

## 2018-12-12 DIAGNOSIS — I10 Essential (primary) hypertension: Secondary | ICD-10-CM | POA: Diagnosis not present

## 2018-12-12 DIAGNOSIS — E785 Hyperlipidemia, unspecified: Secondary | ICD-10-CM | POA: Diagnosis not present

## 2018-12-12 DIAGNOSIS — R69 Illness, unspecified: Secondary | ICD-10-CM | POA: Diagnosis not present

## 2018-12-14 DIAGNOSIS — R69 Illness, unspecified: Secondary | ICD-10-CM | POA: Diagnosis not present

## 2018-12-20 ENCOUNTER — Inpatient Hospital Stay: Payer: Medicare HMO

## 2018-12-20 ENCOUNTER — Other Ambulatory Visit: Payer: Self-pay | Admitting: *Deleted

## 2018-12-20 ENCOUNTER — Inpatient Hospital Stay (HOSPITAL_BASED_OUTPATIENT_CLINIC_OR_DEPARTMENT_OTHER): Payer: Medicare HMO | Admitting: Hematology & Oncology

## 2018-12-20 ENCOUNTER — Telehealth: Payer: Self-pay | Admitting: Hematology & Oncology

## 2018-12-20 ENCOUNTER — Inpatient Hospital Stay: Payer: Medicare HMO | Attending: Hematology & Oncology

## 2018-12-20 ENCOUNTER — Other Ambulatory Visit: Payer: Self-pay

## 2018-12-20 ENCOUNTER — Encounter: Payer: Self-pay | Admitting: Hematology & Oncology

## 2018-12-20 VITALS — BP 104/88 | HR 78 | Temp 98.8°F | Resp 18 | Wt 218.0 lb

## 2018-12-20 DIAGNOSIS — R5383 Other fatigue: Secondary | ICD-10-CM

## 2018-12-20 DIAGNOSIS — C162 Malignant neoplasm of body of stomach: Secondary | ICD-10-CM

## 2018-12-20 DIAGNOSIS — Z85028 Personal history of other malignant neoplasm of stomach: Secondary | ICD-10-CM

## 2018-12-20 DIAGNOSIS — D508 Other iron deficiency anemias: Secondary | ICD-10-CM

## 2018-12-20 DIAGNOSIS — Z923 Personal history of irradiation: Secondary | ICD-10-CM | POA: Diagnosis not present

## 2018-12-20 DIAGNOSIS — Z7984 Long term (current) use of oral hypoglycemic drugs: Secondary | ICD-10-CM | POA: Insufficient documentation

## 2018-12-20 DIAGNOSIS — D509 Iron deficiency anemia, unspecified: Secondary | ICD-10-CM | POA: Diagnosis not present

## 2018-12-20 DIAGNOSIS — Z9221 Personal history of antineoplastic chemotherapy: Secondary | ICD-10-CM | POA: Diagnosis not present

## 2018-12-20 DIAGNOSIS — Z794 Long term (current) use of insulin: Secondary | ICD-10-CM | POA: Insufficient documentation

## 2018-12-20 DIAGNOSIS — E119 Type 2 diabetes mellitus without complications: Secondary | ICD-10-CM | POA: Insufficient documentation

## 2018-12-20 DIAGNOSIS — M1711 Unilateral primary osteoarthritis, right knee: Secondary | ICD-10-CM

## 2018-12-20 DIAGNOSIS — Z79899 Other long term (current) drug therapy: Secondary | ICD-10-CM | POA: Diagnosis not present

## 2018-12-20 DIAGNOSIS — Z7982 Long term (current) use of aspirin: Secondary | ICD-10-CM | POA: Diagnosis not present

## 2018-12-20 DIAGNOSIS — R110 Nausea: Secondary | ICD-10-CM

## 2018-12-20 DIAGNOSIS — E1121 Type 2 diabetes mellitus with diabetic nephropathy: Secondary | ICD-10-CM

## 2018-12-20 LAB — CMP (CANCER CENTER ONLY)
ALT: 21 U/L (ref 0–44)
AST: 21 U/L (ref 15–41)
Albumin: 3.1 g/dL — ABNORMAL LOW (ref 3.5–5.0)
Alkaline Phosphatase: 89 U/L (ref 38–126)
Anion gap: 10 (ref 5–15)
BUN: 19 mg/dL (ref 8–23)
CO2: 26 mmol/L (ref 22–32)
Calcium: 8.1 mg/dL — ABNORMAL LOW (ref 8.9–10.3)
Chloride: 105 mmol/L (ref 98–111)
Creatinine: 1.53 mg/dL — ABNORMAL HIGH (ref 0.44–1.00)
GFR, Est AFR Am: 41 mL/min — ABNORMAL LOW (ref >60)
GFR, Estimated: 35 mL/min — ABNORMAL LOW (ref >60)
Glucose, Bld: 201 mg/dL — ABNORMAL HIGH (ref 70–99)
Potassium: 4 mmol/L (ref 3.5–5.1)
Sodium: 141 mmol/L (ref 135–145)
Total Bilirubin: 0.3 mg/dL (ref 0.3–1.2)
Total Protein: 6.4 g/dL — ABNORMAL LOW (ref 6.5–8.1)

## 2018-12-20 LAB — CBC WITH DIFFERENTIAL (CANCER CENTER ONLY)
Abs Immature Granulocytes: 0.02 10*3/uL (ref 0.00–0.07)
Basophils Absolute: 0.1 10*3/uL (ref 0.0–0.1)
Basophils Relative: 1 %
Eosinophils Absolute: 0.2 10*3/uL (ref 0.0–0.5)
Eosinophils Relative: 3 %
HCT: 32.7 % — ABNORMAL LOW (ref 36.0–46.0)
Hemoglobin: 10.7 g/dL — ABNORMAL LOW (ref 12.0–15.0)
Immature Granulocytes: 0 %
Lymphocytes Relative: 35 %
Lymphs Abs: 2.8 10*3/uL (ref 0.7–4.0)
MCH: 28.1 pg (ref 26.0–34.0)
MCHC: 32.7 g/dL (ref 30.0–36.0)
MCV: 85.8 fL (ref 80.0–100.0)
Monocytes Absolute: 0.7 10*3/uL (ref 0.1–1.0)
Monocytes Relative: 9 %
Neutro Abs: 4.1 10*3/uL (ref 1.7–7.7)
Neutrophils Relative %: 52 %
Platelet Count: 354 10*3/uL (ref 150–400)
RBC: 3.81 MIL/uL — ABNORMAL LOW (ref 3.87–5.11)
RDW: 16 % — ABNORMAL HIGH (ref 11.5–15.5)
WBC Count: 7.9 10*3/uL (ref 4.0–10.5)
nRBC: 0 % (ref 0.0–0.2)

## 2018-12-20 LAB — IRON AND TIBC
Iron: 46 ug/dL (ref 41–142)
Saturation Ratios: 24 % (ref 21–57)
TIBC: 193 ug/dL — ABNORMAL LOW (ref 236–444)
UIBC: 146 ug/dL (ref 120–384)

## 2018-12-20 LAB — FERRITIN: Ferritin: 259 ng/mL (ref 11–307)

## 2018-12-20 MED ORDER — ONDANSETRON HCL 4 MG PO TABS: 4.0000 mg | ORAL_TABLET | Freq: Three times a day (TID) | ORAL | 2 refills | Status: DC | PRN

## 2018-12-20 NOTE — Patient Instructions (Signed)

## 2018-12-20 NOTE — Telephone Encounter (Signed)
Appointments scheduled per 5/28 los

## 2018-12-20 NOTE — Progress Notes (Signed)
Hematology and Oncology Follow Up Visit  [name] [id number] Sep 21, 1950 68 y.o. 12/20/2018   Principle Diagnosis:  Stage IB (T1, N1, M0) adenocarcinoma of the stomach Iron deficiency anemia  Current Therapy:   Observation IV iron as indicated - last received in December 2018   Interim History: [name] is here today for follow-up.  She is not feeling all that well today.  She is nauseated.  She is her blood sugar was 79 this morning.  This is quite low for her.  When we checked her blood sugar in the office it was 201.  Unfortunately, we cannot give her any IV fluid today.  She is a little more anemic.  Couple months ago, her ferritin was only 18.  Hemoglobin was fine so we did not give her any iron.  We will see what her iron studies look like today.  She has a very bad right knee.  She has osteoarthritis in the right knee.  We will need to see about when surgery is very done for her.  There is been no fever.  She has had no cough.  She is isolated herself because of the coronavirus.  Her thyroid has been doing okay.  Overall, her performance status is ECOG 1-2.    Medications:  Allergies as of 12/20/2018      Reactions   Adhesive [tape] Other (See Comments)   Burn Skin   Codeine Other (See Comments)   paranoid   Zocor [simvastatin - High Dose] Other (See Comments)   Muscle spasms   Penicillins Rash   Has patient had a PCN reaction causing immediate rash, facial/tongue/throat swelling, SOB or lightheadedness with hypotension: Yes Has patient had a PCN reaction causing severe rash involving mucus membranes or skin necrosis: No Has patient had a PCN reaction that required hospitalization does not remember.  Has patient had a PCN reaction occurring within the last 10 years: No If all of the above answers are "NO", then may proceed with Cephalosporin use.      Medication List       Accurate as of Dec 20, 2018  8:59 AM. If you have any questions, ask your nurse or  doctor.        albuterol (2.5 MG/3ML) 0.083% nebulizer solution Commonly known as:  PROVENTIL Take 3 mLs (2.5 mg total) by nebulization every 6 (six) hours as needed for wheezing or shortness of breath.   albuterol 108 (90 Base) MCG/ACT inhaler Commonly known as:  VENTOLIN HFA Inhale 2 puffs into the lungs every 6 (six) hours as needed for wheezing or shortness of breath.   aspirin 81 MG tablet Take 1 tablet (81 mg total) by mouth at bedtime.   clopidogrel 75 MG tablet Commonly known as:  Plavix Take 1 tablet (75 mg total) by mouth daily.   fluticasone 50 MCG/ACT nasal spray Commonly known as:  FLONASE Place 2 sprays into both nostrils daily.   gabapentin 800 MG tablet Commonly known as:  NEURONTIN Take 1 tablet (800 mg total) by mouth 3 (three) times daily.   glimepiride 4 MG tablet Commonly known as:  AMARYL TAKE 1 TABLET BY MOUTH TWICE A DAY   glucose blood test strip Commonly known as:  OneTouch Verio Use as instructed to check blood sugar 3 times a day   Insulin Degludec 200 UNIT/ML Sopn Commonly known as:  [place] FlexTouch Inject 20 Units into the skin daily.   Insulin Pen Needle 33G X 5 MM Misc 1 each  by Does not apply route daily. Use to inject insulin once a day.   ipratropium-albuterol 0.5-2.5 (3) MG/3ML Soln Commonly known as:  DUONEB Take 3 mLs by nebulization every 6 (six) hours as needed. What changed:  reasons to take this   isosorbide mononitrate 30 MG 24 hr tablet Commonly known as:  IMDUR Take 0.5 tablets (15 mg total) by mouth daily.   metFORMIN 500 MG tablet Commonly known as:  GLUCOPHAGE Take 1 tablet (500 mg total) by mouth 2 (two) times daily with a meal.   methocarbamol 500 MG tablet Commonly known as:  Robaxin Take 1 tablet (500 mg total) by mouth every 6 (six) hours as needed for muscle spasms.   metoprolol tartrate 25 MG tablet Commonly known as:  LOPRESSOR Take 1 tablet (25 mg total) by mouth 2 (two) times daily.    multivitamin with minerals Tabs tablet Take 1 tablet by mouth daily.   nitroGLYCERIN 0.4 MG SL tablet Commonly known as:  Nitrostat Place 1 tablet (0.4 mg total) under the tongue every 5 (five) minutes as needed.   pantoprazole 40 MG tablet Commonly known as:  PROTONIX Take 1 tablet (40 mg total) by mouth daily.   rosuvastatin 40 MG tablet Commonly known as:  CRESTOR Take 1 tablet (40 mg total) by mouth at bedtime.   sertraline 50 MG tablet Commonly known as:  ZOLOFT Take 1 tablet (50 mg total) by mouth daily.   sucralfate 1 g tablet Commonly known as:  Carafate Take 1 tablet (1 g total) by mouth 2 (two) times daily.   traMADol 50 MG tablet Commonly known as:  ULTRAM TAKE 2 TABLETS BY MOUTH 3 TIMES DAILY AS NEEDED FOR MODERATE PAIN       Allergies:  Allergies  Allergen Reactions  . Adhesive [Tape] Other (See Comments)    Burn Skin  . Codeine Other (See Comments)    paranoid  . Zocor [Simvastatin - High Dose] Other (See Comments)    Muscle spasms  . Penicillins Rash    Has patient had a PCN reaction causing immediate rash, facial/tongue/throat swelling, SOB or lightheadedness with hypotension: Yes Has patient had a PCN reaction causing severe rash involving mucus membranes or skin necrosis: No Has patient had a PCN reaction that required hospitalization does not remember.  Has patient had a PCN reaction occurring within the last 10 years: No If all of the above answers are "NO", then may proceed with Cephalosporin use.     Past Medical History, Surgical history, Social history, and Family History were reviewed and updated.  Review of Systems: Review of Systems  Constitutional: Positive for malaise/fatigue.  HENT: Negative.   Eyes: Negative.   Respiratory: Positive for shortness of breath.   Cardiovascular: Negative.   Gastrointestinal: Positive for nausea.  Genitourinary: Negative.   Musculoskeletal: Negative.   Skin: Negative.   Neurological: Positive for  focal weakness.  Endo/Heme/Allergies: Negative.      Physical Exam:  weight is 218 lb (98.9 kg). Her oral temperature is 98.8 F (37.1 C). Her blood pressure is 104/88 and her pulse is 78. Her respiration is 18 and oxygen saturation is 100%.   Wt Readings from Last 3 Encounters:  12/20/18 218 lb (98.9 kg)  10/01/18 220 lb (99.8 kg)  09/10/18 223 lb (101.2 kg)    Physical Exam Vitals signs reviewed.  HENT:     Head: Normocephalic and atraumatic.  Eyes:     Pupils: Pupils are equal, round, and reactive to light.  Neck:     Musculoskeletal: Normal range of motion.  Cardiovascular:     Rate and Rhythm: Normal rate and regular rhythm.     Heart sounds: Normal heart sounds.  Pulmonary:     Effort: Pulmonary effort is normal.     Breath sounds: Normal breath sounds.  Abdominal:     General: Bowel sounds are normal.     Palpations: Abdomen is soft.  Musculoskeletal: Normal range of motion.        General: No tenderness or deformity.  Lymphadenopathy:     Cervical: No cervical adenopathy.  Skin:    General: Skin is warm and dry.     Findings: No erythema or rash.  Neurological:     Mental Status: She is alert and oriented to person, place, and time.  Psychiatric:        Behavior: Behavior normal.        Thought Content: Thought content normal.        Judgment: Judgment normal.      Lab Results  Component Value Date   WBC 7.9 12/20/2018   HGB 10.7 (L) 12/20/2018   HCT 32.7 (L) 12/20/2018   MCV 85.8 12/20/2018   PLT 354 12/20/2018   Lab Results  Component Value Date   FERRITIN 252.8 10/01/2018   IRON 55 10/01/2018   TIBC 258 06/04/2018   UIBC 186 06/04/2018   IRONPCTSAT 18.4 (L) 10/01/2018   Lab Results  Component Value Date   RETICCTPCT 1.5 04/28/2014   RBC 3.81 (L) 12/20/2018   RETICCTABS 59.9 04/28/2014   No results found for: KPAFRELGTCHN, LAMBDASER, KAPLAMBRATIO No results found for: Kandis Cocking, IGMSERUM No results found for: Odetta Pink, SPEI   Chemistry      Component Value Date/Time   NA 141 12/20/2018 0815   NA 138 07/06/2017 0745   K 4.0 12/20/2018 0815   K 4.4 07/06/2017 0745   CL 105 12/20/2018 0815   CL 101 08/02/2016 1351   CO2 26 12/20/2018 0815   CO2 22 07/06/2017 0745   BUN 19 12/20/2018 0815   BUN 19.7 07/06/2017 0745   CREATININE 1.53 (H) 12/20/2018 0815   CREATININE 1.3 (H) 07/06/2017 0745      Component Value Date/Time   CALCIUM 8.1 (L) 12/20/2018 0815   CALCIUM 9.3 07/06/2017 0745   ALKPHOS 89 12/20/2018 0815   ALKPHOS 115 07/06/2017 0745   AST 21 12/20/2018 0815   AST 18 07/06/2017 0745   ALT 21 12/20/2018 0815   ALT 14 07/06/2017 0745   BILITOT 0.3 12/20/2018 0815   BILITOT 0.30 07/06/2017 0745       Impression and Plan: [name] is a very pleasant 67 yo African American female with history of stage IB stomach cancer. She had her tumor resected and completed adjuvant radiation and chemo in 2010.   I really doubt that cancer will be a problem for her.  I think her diabetes clearly be what we will limit her life expectancy.  Again, we just not can be able to give her IV fluids today.  We will see what her iron studies show.  I am sure that her iron is going to be on the low side given her hemoglobin having dropped.  We will plan to get her back to see [place] in 3 months.    Volanda Napoleon, MD 5/28/20208:59 AM

## 2019-01-02 ENCOUNTER — Other Ambulatory Visit: Payer: Self-pay | Admitting: *Deleted

## 2019-01-02 DIAGNOSIS — R110 Nausea: Secondary | ICD-10-CM

## 2019-01-02 MED ORDER — PANTOPRAZOLE SODIUM 40 MG PO TBEC: 40.0000 mg | DELAYED_RELEASE_TABLET | Freq: Every day | ORAL | 1 refills | Status: DC

## 2019-01-05 ENCOUNTER — Other Ambulatory Visit: Payer: Self-pay | Admitting: Family Medicine

## 2019-01-07 ENCOUNTER — Other Ambulatory Visit: Payer: Self-pay | Admitting: Internal Medicine

## 2019-01-07 ENCOUNTER — Other Ambulatory Visit: Payer: Self-pay

## 2019-01-07 MED ORDER — INSULIN PEN NEEDLE 33G X 5 MM MISC: 1.0000 | Freq: Every day | 11 refills | Status: DC

## 2019-01-08 ENCOUNTER — Other Ambulatory Visit: Payer: Self-pay | Admitting: Family Medicine

## 2019-01-08 DIAGNOSIS — G894 Chronic pain syndrome: Secondary | ICD-10-CM

## 2019-01-08 MED ORDER — TRAMADOL HCL 50 MG PO TABS: ORAL_TABLET | ORAL | 0 refills | Status: DC

## 2019-01-08 NOTE — Telephone Encounter (Signed)
Tramadol refill.   Last OV: 10/19/2018 Last Fill: 11/13/2018 #180 and 0RF UDS: 12/05/2017 Low risk

## 2019-01-08 NOTE — Telephone Encounter (Signed)
Medication: traMADol (ULTRAM) 50 MG tablet [[id number]]  [id number] [phone](Phone) [phone](Fax)

## 2019-01-14 ENCOUNTER — Other Ambulatory Visit: Payer: Self-pay | Admitting: *Deleted

## 2019-01-14 MED ORDER — ISOSORBIDE MONONITRATE ER 30 MG PO TB24: 15.0000 mg | ORAL_TABLET | Freq: Every day | ORAL | 2 refills | Status: DC

## 2019-01-14 MED ORDER — NITROGLYCERIN 0.4 MG SL SUBL: 0.4000 mg | SUBLINGUAL_TABLET | SUBLINGUAL | 3 refills | Status: AC | PRN

## 2019-01-14 MED ORDER — METOPROLOL TARTRATE 25 MG PO TABS: 25.0000 mg | ORAL_TABLET | Freq: Two times a day (BID) | ORAL | 2 refills | Status: DC

## 2019-01-14 MED ORDER — CLOPIDOGREL BISULFATE 75 MG PO TABS: 75.0000 mg | ORAL_TABLET | Freq: Every day | ORAL | 2 refills | Status: DC

## 2019-01-23 ENCOUNTER — Ambulatory Visit (INDEPENDENT_AMBULATORY_CARE_PROVIDER_SITE_OTHER): Payer: Medicare HMO | Admitting: Family

## 2019-01-23 ENCOUNTER — Other Ambulatory Visit: Payer: Self-pay

## 2019-01-23 ENCOUNTER — Telehealth: Payer: Self-pay | Admitting: Family Medicine

## 2019-01-23 DIAGNOSIS — J029 Acute pharyngitis, unspecified: Secondary | ICD-10-CM

## 2019-01-23 MED ORDER — CEFDINIR 300 MG PO CAPS: 300.0000 mg | ORAL_CAPSULE | Freq: Two times a day (BID) | ORAL | 0 refills | Status: DC

## 2019-01-23 NOTE — Progress Notes (Signed)
Virtual Visit via Video Note  I connected with [name] on 01/23/19 at 12:00 PM EDT by a video enabled telemedicine application and verified that I am speaking with the correct person using two identifiers.  Location: Patient: home Provider: work   I discussed the limitations of evaluation and management by telemedicine and the availability of in person appointments. The patient expressed understanding and agreed to proceed.  History of Present Illness:  Patient is a 68 yr old female who presents today with chief complaint of sore throat. Reports that sore throat began 2 days ago. Denies fever.  Grandson is coughing and has been coughing x 2 days. He has been leaving the house to play football.  She denies cough, or photophobia. Denies headache.    Reports sore throat is 5/10.  Denies difficulty eating/drinking.   Past Medical History:  Diagnosis Date  . Asthma   . Degenerative joint disease   . Diabetes mellitus   . Dyspnea   . GERD (gastroesophageal reflux disease)   . Hyperlipidemia    no longer on medication for this  . Hypertension    patient denies  . Myocardial infarction ([place]) 08/2018  . Neuropathy    bilateral hands and feet  . Sickle cell trait ([place])   . Sleep apnea   . stomach ca dx'd 2010   chemo/xrt comp 05/2009  . TIA (transient ischemic attack) 07/2015     Social History   Socioeconomic History  . Marital status: Divorced    Spouse name: Not on file  . Number of children: Not on file  . Years of education: Not on file  . Highest education level: Not on file  Occupational History  . Not on file  Social Needs  . Financial resource strain: Not on file  . Food insecurity    Worry: Not on file    Inability: Not on file  . Transportation needs    Medical: Not on file    Non-medical: Not on file  Tobacco Use  . Smoking status: Former Smoker    Packs/day: 1.00    Years: 15.00    Pack years: 15.00    Types: Cigarettes    Start date: 09/27/1973   Quit date: 10/15/1988    Years since quitting: 30.3  . Smokeless tobacco: Never Used  . Tobacco comment: quit smoking 14 years ago  Substance and Sexual Activity  . Alcohol use: No    Alcohol/week: 0.0 standard drinks  . Drug use: No  . Sexual activity: Not Currently  Lifestyle  . Physical activity    Days per week: Not on file    Minutes per session: Not on file  . Stress: Not on file  Relationships  . Social Herbalist on phone: Not on file    Gets together: Not on file    Attends religious service: Not on file    Active member of club or organization: Not on file    Attends meetings of clubs or organizations: Not on file    Relationship status: Not on file  . Intimate partner violence    Fear of current or ex partner: Not on file    Emotionally abused: Not on file    Physically abused: Not on file    Forced sexual activity: Not on file  Other Topics Concern  . Not on file  Social History Narrative  . Not on file    Past Surgical History:  Procedure Laterality Date  .  APPENDECTOMY    . CESAREAN SECTION    . COLON SURGERY     colonscopy  . COLONOSCOPY WITH PROPOFOL N/A 06/01/2016   Procedure: COLONOSCOPY WITH PROPOFOL;  Surgeon: Wilford Corner, MD;  Location: [facility] ENDOSCOPY;  Service: Endoscopy;  Laterality: N/A;  . CORONARY STENT INTERVENTION N/A 08/29/2018   Procedure: CORONARY STENT INTERVENTION;  Surgeon: Wellington Hampshire, MD;  Location: [facility];  Service: Cardiovascular;  Laterality: N/A;  . CORONARY/GRAFT ACUTE MI REVASCULARIZATION N/A 08/27/2018   Procedure: Coronary/Graft Acute MI Revascularization;  Surgeon: Leonie Man, MD;  Location: [facility];  Service: Cardiovascular;  Laterality: N/A;  . ESOPHAGOGASTRODUODENOSCOPY N/A 10/15/2012   Procedure: ESOPHAGOGASTRODUODENOSCOPY (EGD);  Surgeon: Lear Ng, MD;  Location: [facility];  Service: Endoscopy;  Laterality: N/A;  . ESOPHAGOGASTRODUODENOSCOPY N/A 01/15/2016    Procedure: ESOPHAGOGASTRODUODENOSCOPY (EGD);  Surgeon: Ronald Lobo, MD;  Location: [facility];  Service: Endoscopy;  Laterality: N/A;  . HERNIA REPAIR    . LEFT HEART CATH AND CORONARY ANGIOGRAPHY N/A 08/27/2018   Procedure: LEFT HEART CATH AND CORONARY ANGIOGRAPHY;  Surgeon: Leonie Man, MD;  Location: [facility];  Service: Cardiovascular;  Laterality: N/A;  . SHOULDER SURGERY     Left  . TOTAL HIP ARTHROPLASTY     Right  . TOTAL KNEE ARTHROPLASTY     Left    Family History  Problem Relation Age of Onset  . Diabetes Brother   . Alzheimer's disease Father 23  . Cancer Mother 40  . Diabetes Maternal Grandmother   . Cancer Maternal Grandfather        lung  . Suicidality Neg Hx   . Depression Neg Hx   . Dementia Neg Hx   . Anxiety disorder Neg Hx     Allergies  Allergen Reactions  . Adhesive [Tape] Other (See Comments)    Burn Skin  . Codeine Other (See Comments)    paranoid  . Zocor [Simvastatin - High Dose] Other (See Comments)    Muscle spasms  . Penicillins Rash    Has patient had a PCN reaction causing immediate rash, facial/tongue/throat swelling, SOB or lightheadedness with hypotension: Yes Has patient had a PCN reaction causing severe rash involving mucus membranes or skin necrosis: No Has patient had a PCN reaction that required hospitalization does not remember.  Has patient had a PCN reaction occurring within the last 10 years: No If all of the above answers are "NO", then may proceed with Cephalosporin use.     Current Outpatient Medications on File Prior to Visit  Medication Sig Dispense Refill  . albuterol (PROVENTIL HFA;VENTOLIN HFA) 108 (90 Base) MCG/ACT inhaler Inhale 2 puffs into the lungs every 6 (six) hours as needed for wheezing or shortness of breath. 1 Inhaler 2  . albuterol (PROVENTIL) (2.5 MG/3ML) 0.083% nebulizer solution Take 3 mLs (2.5 mg total) by nebulization every 6 (six) hours as needed for wheezing or shortness of breath. 75  mL 12  . clopidogrel (PLAVIX) 75 MG tablet Take 1 tablet (75 mg total) by mouth daily. 90 tablet 2  . fluticasone (FLONASE) 50 MCG/ACT nasal spray Place 2 sprays into both nostrils daily. 16 g 5  . gabapentin (NEURONTIN) 800 MG tablet Take 1 tablet (800 mg total) by mouth 3 (three) times daily. 270 tablet 1  . glucose blood (ONETOUCH VERIO) test strip Use as instructed to check blood sugar 3 times a day 300 each 12  . Insulin Degludec (TRESIBA FLEXTOUCH) 200 UNIT/ML  SOPN Inject 20 Units into the skin daily. 3 pen 3  . Insulin Pen Needle (B-D UF III MINI PEN NEEDLES) 31G X 5 MM MISC Use to inject insulin once a day. 100 each 11  . ipratropium-albuterol (DUONEB) 0.5-2.5 (3) MG/3ML SOLN Take 3 mLs by nebulization every 6 (six) hours as needed. (Patient taking differently: Take 3 mLs by nebulization every 6 (six) hours as needed (sob and wheezing). ) 360 mL 2  . isosorbide mononitrate (IMDUR) 30 MG 24 hr tablet Take 0.5 tablets (15 mg total) by mouth daily. 45 tablet 2  . metFORMIN (GLUCOPHAGE) 500 MG tablet Take 1 tablet (500 mg total) by mouth 2 (two) times daily with a meal. 180 tablet 3  . methocarbamol (ROBAXIN) 500 MG tablet Take 1 tablet (500 mg total) by mouth every 6 (six) hours as needed for muscle spasms. 45 tablet 0  . metoprolol tartrate (LOPRESSOR) 25 MG tablet Take 1 tablet (25 mg total) by mouth 2 (two) times daily. 180 tablet 2  . Multiple Vitamin (MULTIVITAMIN WITH MINERALS) TABS tablet Take 1 tablet by mouth daily. 90 tablet 3  . nitroGLYCERIN (NITROSTAT) 0.4 MG SL tablet Place 1 tablet (0.4 mg total) under the tongue every 5 (five) minutes as needed. 25 tablet 3  . pantoprazole (PROTONIX) 40 MG tablet Take 1 tablet (40 mg total) by mouth daily. 90 tablet 1  . rosuvastatin (CRESTOR) 40 MG tablet Take 1 tablet (40 mg total) by mouth at bedtime. 90 tablet 3  . sucralfate (CARAFATE) 1 g tablet Take 1 tablet (1 g total) by mouth 2 (two) times daily. 60 tablet 3  . traMADol (ULTRAM) 50 MG  tablet TAKE 2 TABLETS BY MOUTH 3 TIMES DAILY AS NEEDED FOR MODERATE PAIN 180 tablet 0   Current Facility-Administered Medications on File Prior to Visit  Medication Dose Route Frequency Provider Last Rate Last Dose  . sodium chloride flush (NS) 0.9 % injection 10 mL  10 mL Intravenous PRN [place], Holli Humbles, NP   10 mL at 09/13/16 1146    There were no vitals taken for this visit.   Observations/Objective:   Gen: Awake, alert, no acute distress Neck: mild left sided neck swelling noted Resp: Breathing is even and non-labored Psych: calm/pleasant demeanor Neuro: Alert and Oriented x 3, + facial symmetry, speech is clear.   Assessment and Plan:  1) Sore throat-Will give patient rx for empiric cefdinir.  She is advised to call if new/worsening symptoms or if not improved in 2-3 days. Pt verbalizes understanding.  Follow Up Instructions:    I discussed the assessment and treatment plan with the patient. The patient was provided an opportunity to ask questions and all were answered. The patient agreed with the plan and demonstrated an understanding of the instructions.   The patient was advised to call back or seek an in-person evaluation if the symptoms worsen or if the condition fails to improve as anticipated.  Nance Pear, NP

## 2019-01-23 NOTE — Telephone Encounter (Signed)
Patient stated she has a sore throat and would like something to be called in to [facility] preferred pharmacy [facility] [address] for [facility].

## 2019-01-23 NOTE — Telephone Encounter (Signed)
Needs virtual visit please.

## 2019-01-24 ENCOUNTER — Other Ambulatory Visit: Payer: Self-pay | Admitting: Family Medicine

## 2019-01-24 DIAGNOSIS — E1151 Type 2 diabetes mellitus with diabetic peripheral angiopathy without gangrene: Secondary | ICD-10-CM

## 2019-01-24 DIAGNOSIS — IMO0002 Reserved for concepts with insufficient information to code with codable children: Secondary | ICD-10-CM

## 2019-01-24 MED ORDER — SERTRALINE HCL 50 MG PO TABS: 50.0000 mg | ORAL_TABLET | Freq: Every day | ORAL | 1 refills | Status: DC

## 2019-01-24 MED ORDER — GLIMEPIRIDE 4 MG PO TABS: 4.0000 mg | ORAL_TABLET | Freq: Two times a day (BID) | ORAL | 1 refills | Status: DC

## 2019-01-24 MED ORDER — ASPIRIN EC 81 MG PO TBEC: 81.0000 mg | DELAYED_RELEASE_TABLET | Freq: Every day | ORAL | 3 refills | Status: DC

## 2019-01-24 NOTE — Telephone Encounter (Signed)
Medication: aspirin 81 MG tablet, glimepiride (AMARYL) 4 MG tablet, and sertraline (ZOLOFT) 50 MG tablet  Has the patient contacted their pharmacy? yes   Preferred Pharmacy (with phone number or street name):  [place], [place] [phone] (Phone) [phone] (Fax)   Agent: Please be advised that RX refills may take up to 3 business days. We ask that you follow-up with your pharmacy.

## 2019-01-24 NOTE — Telephone Encounter (Signed)
Rx sent 

## 2019-01-25 ENCOUNTER — Other Ambulatory Visit: Payer: Self-pay | Admitting: Hematology & Oncology

## 2019-01-29 ENCOUNTER — Telehealth: Payer: Self-pay | Admitting: Family Medicine

## 2019-01-29 DIAGNOSIS — Z20822 Contact with and (suspected) exposure to covid-19: Secondary | ICD-10-CM

## 2019-01-29 NOTE — Telephone Encounter (Signed)
Please notify [facility] patient needs COVID testing for sore throat, congestion and let patient know

## 2019-01-29 NOTE — Telephone Encounter (Signed)
pls call pt concering bad cough she has gotten, she had a sore throat and now it is in her chest and she is producing green mucus [phone]

## 2019-01-29 NOTE — Telephone Encounter (Signed)
Does Dr. Carollee Herter want this patient to be scheduled for COVID 19 testing?

## 2019-01-30 NOTE — Telephone Encounter (Signed)
Per Dr. Charlett Blake pt needs COVID testing due to symptoms.

## 2019-01-30 NOTE — Telephone Encounter (Signed)
Patient called to schedule the covid testing, she says she doesn't want to be tested. She asks if she's tested will everyone in her home have to be tested. I advised they can wait to see what her test shows before being tested, it's up to them. She says her sore throat has cleared and she's still on the antibiotic. I advised I will send this to her provider.

## 2019-01-30 NOTE — Telephone Encounter (Signed)
This encounter was created in error - please disregard.

## 2019-01-30 NOTE — Telephone Encounter (Signed)
Please see below.

## 2019-01-31 NOTE — Addendum Note (Signed)
Addended by: Denyce Robert on: 01/31/2019 02:11 PM   Modules accepted: Orders

## 2019-01-31 NOTE — Telephone Encounter (Signed)
Per Dr. Charlett Blake pt should be tested for COVID due to symptoms.

## 2019-01-31 NOTE — Telephone Encounter (Signed)
Spoke with patient, advised her that Dr. Charlett Blake highly suggests that she get tested for COVID 19.  Patient agreed.  Scheduled her for test 02/01/19 at 3:45 pm at [facility].  Testing protocol reviewed with patient.

## 2019-01-31 NOTE — Telephone Encounter (Signed)
Please set her up with covid testing for sore throat

## 2019-02-01 ENCOUNTER — Other Ambulatory Visit: Payer: Self-pay

## 2019-02-01 DIAGNOSIS — R6889 Other general symptoms and signs: Secondary | ICD-10-CM | POA: Diagnosis not present

## 2019-02-01 DIAGNOSIS — Z20822 Contact with and (suspected) exposure to covid-19: Secondary | ICD-10-CM

## 2019-02-05 ENCOUNTER — Other Ambulatory Visit: Payer: Self-pay | Admitting: Family

## 2019-02-05 ENCOUNTER — Other Ambulatory Visit: Payer: Self-pay | Admitting: Hematology & Oncology

## 2019-02-05 DIAGNOSIS — G894 Chronic pain syndrome: Secondary | ICD-10-CM

## 2019-02-05 DIAGNOSIS — K219 Gastro-esophageal reflux disease without esophagitis: Secondary | ICD-10-CM

## 2019-02-05 DIAGNOSIS — C162 Malignant neoplasm of body of stomach: Secondary | ICD-10-CM

## 2019-02-06 ENCOUNTER — Other Ambulatory Visit: Payer: Self-pay | Admitting: Adult Health

## 2019-02-06 ENCOUNTER — Other Ambulatory Visit: Payer: Self-pay | Admitting: Physician Assistant

## 2019-02-06 LAB — NOVEL CORONAVIRUS, NAA: SARS-CoV-2, NAA: NOT DETECTED

## 2019-02-07 ENCOUNTER — Ambulatory Visit: Payer: Self-pay | Admitting: Internal Medicine

## 2019-02-12 ENCOUNTER — Other Ambulatory Visit: Payer: Self-pay

## 2019-02-12 ENCOUNTER — Encounter: Payer: Self-pay | Admitting: Family Medicine

## 2019-02-12 ENCOUNTER — Ambulatory Visit (INDEPENDENT_AMBULATORY_CARE_PROVIDER_SITE_OTHER): Payer: Medicare HMO | Admitting: Family Medicine

## 2019-02-12 DIAGNOSIS — R1114 Bilious vomiting: Secondary | ICD-10-CM | POA: Diagnosis not present

## 2019-02-12 DIAGNOSIS — K047 Periapical abscess without sinus: Secondary | ICD-10-CM | POA: Diagnosis not present

## 2019-02-12 DIAGNOSIS — G894 Chronic pain syndrome: Secondary | ICD-10-CM

## 2019-02-12 MED ORDER — FAMOTIDINE 40 MG PO TABS: 40.0000 mg | ORAL_TABLET | Freq: Every day | ORAL | 3 refills | Status: DC

## 2019-02-12 MED ORDER — CEPHALEXIN 500 MG PO CAPS: 500.0000 mg | ORAL_CAPSULE | Freq: Two times a day (BID) | ORAL | 0 refills | Status: DC

## 2019-02-12 MED ORDER — TRAMADOL HCL 50 MG PO TABS: ORAL_TABLET | ORAL | 0 refills | Status: DC

## 2019-02-12 NOTE — Progress Notes (Signed)
Virtual Visit via Video Note  I connected with [name] on 02/12/19 at  2:45 PM EDT by a video enabled telemedicine application and verified that I am speaking with the correct person using two identifiers.  Location: Patient: home  Provider: office    I discussed the limitations of evaluation and management by telemedicine and the availability of in person appointments. The patient expressed understanding and agreed to proceed.  History of Present Illness: Pt is home c/co nausea/ vomiting every morning at 5 am.  She has some zofran which helps but when it wears off it is right back.  She is taking protonix daily   She has had a terrible time with her teeth and needs most of the pulled but is afraid to go to the dentist-- she is afraid she has an infection    Observations/Objective: No vitals obtained  Pt is in NAD   Assessment and Plan:  1. Chronic pain syndrome Stable Refill meds  - traMADol (ULTRAM) 50 MG tablet; TAKE 2 TABLETS BY MOUTH 3 TIMES DAILY AS NEEDED FOR MODERATE PAIN  Dispense: 180 tablet; Refill: 0  2. Bilious vomiting with nausea Check labs con't zofran con't protonix and add pepcid F/u with Dr Michail Sermon if no better  - CBC with Differential/Platelet; Future - H. pylori antibody, IgG; Future - Comprehensive metabolic panel; Future - famotidine (PEPCID) 40 MG tablet; Take 1 tablet (40 mg total) by mouth daily.  Dispense: 30 tablet; Refill: 3  3. Tooth infection abx per orders Pt needs to f/u dentist  - cephALEXin (KEFLEX) 500 MG capsule; Take 1 capsule (500 mg total) by mouth 2 (two) times daily.  Dispense: 20 capsule; Refill: 0  Follow Up Instructions:    I discussed the assessment and treatment plan with the patient. The patient was provided an opportunity to ask questions and all were answered. The patient agreed with the plan and demonstrated an understanding of the instructions.   The patient was advised to call back or seek an in-person evaluation  if the symptoms worsen or if the condition fails to improve as anticipated.  I provided 25 minutes of non-face-to-face time during this encounter.   Ann Held, DO

## 2019-02-13 ENCOUNTER — Telehealth: Payer: Self-pay | Admitting: Internal Medicine

## 2019-02-13 MED ORDER — METFORMIN HCL 500 MG PO TABS: 500.0000 mg | ORAL_TABLET | Freq: Two times a day (BID) | ORAL | 3 refills | Status: AC

## 2019-02-13 NOTE — Telephone Encounter (Signed)
MEDICATION: metFORMIN (GLUCOPHAGE) 446 MG tablet  Trulicity  PHARMACY:  [facility]  Fax : [phone]  IS THIS A 90 DAY SUPPLY : Do not do 90 day supplies  IS PATIENT OUT OF MEDICATION: Metformin   IF NOT; HOW MUCH IS LEFT:   LAST APPOINTMENT DATE: @6 /15/2020  NEXT APPOINTMENT DATE:@Visit  date not found  DO WE HAVE YOUR PERMISSION TO LEAVE A DETAILED MESSAGE:  OTHER COMMENTS:    **Let patient know to contact pharmacy at the end of the day to make sure medication is ready. **  ** Please notify patient to allow 48-72 hours to process**  **Encourage patient to contact the pharmacy for refills or they can request refills through [facility]**

## 2019-02-13 NOTE — Telephone Encounter (Signed)
RX sent

## 2019-02-14 IMAGING — CT CT CHEST W/ CM
2 of 5 series · 13 of 36 positions shown, 16 images · IV contrast (iopamidol)
Comparison: CT abdomen pelvis dated December 17, 2016. CT chest dated

CLINICAL DATA: Shortness of breath and abdominal pain. History of
gastric cancer.

EXAM:
CT CHEST, ABDOMEN, AND PELVIS WITH CONTRAST
TECHNIQUE: Multidetector CT imaging of the chest, abdomen and pelvis was
performed following the standard protocol during bolus
administration of intravenous contrast.
CONTRAST:  100mL [id number] IOPAMIDOL (WV55WL-HZZ) INJECTION 61%

[Series 2: cap with 2 · axial · 0.90mm/px · z∈[-576,-81]mm · 10 of 123 slices shown, 13 images]
[im 12/123  mediastinal]
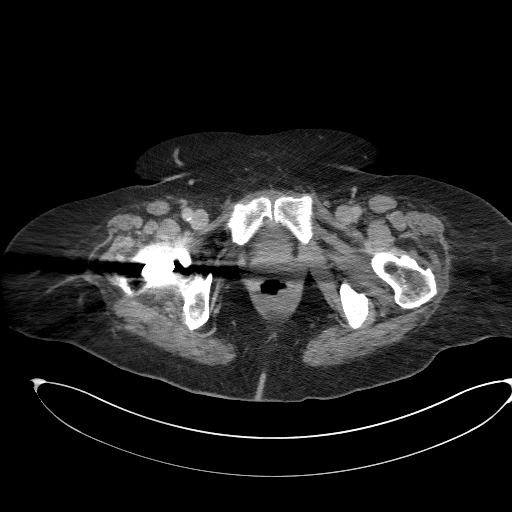
[im 12/123  lung]
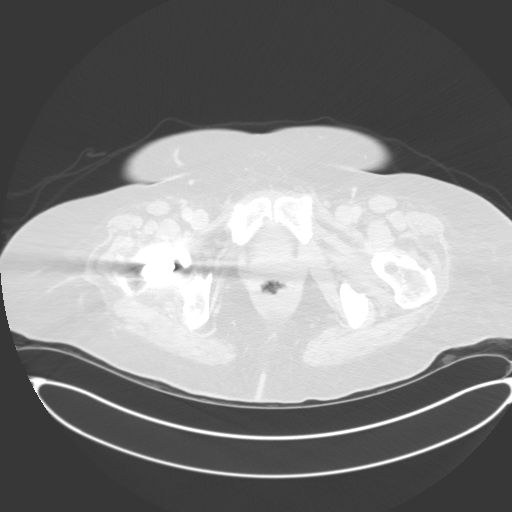
[im 23/123  lung]
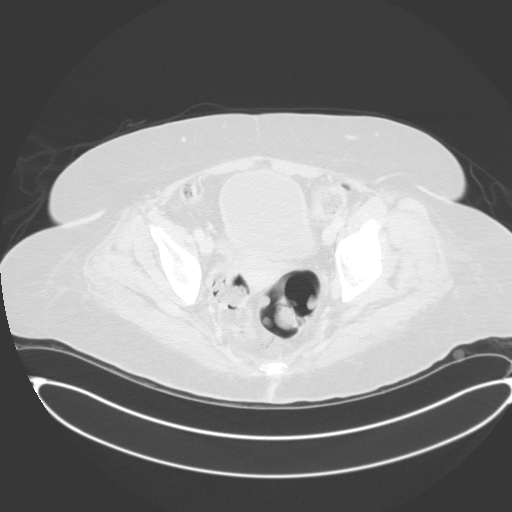
[im 34/123  lung]
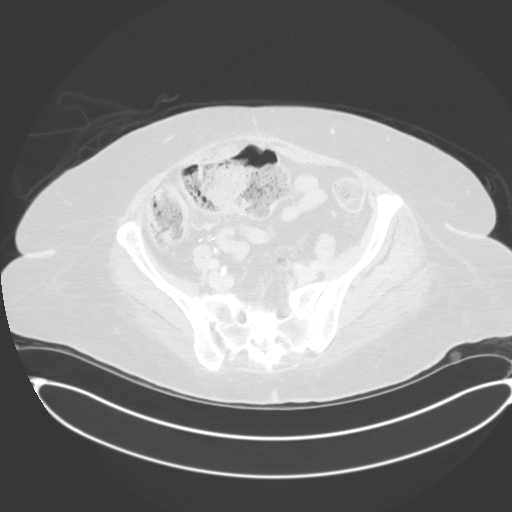
[im 45/123  lung]
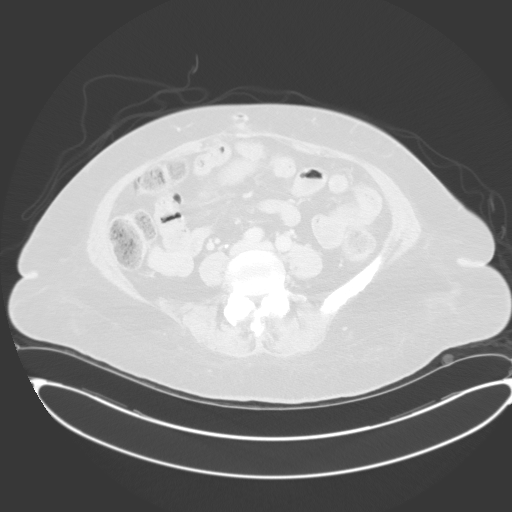
[im 56/123  mediastinal]
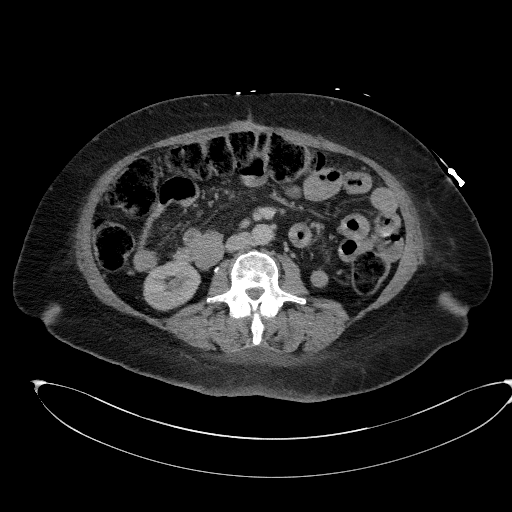
[im 56/123  lung]
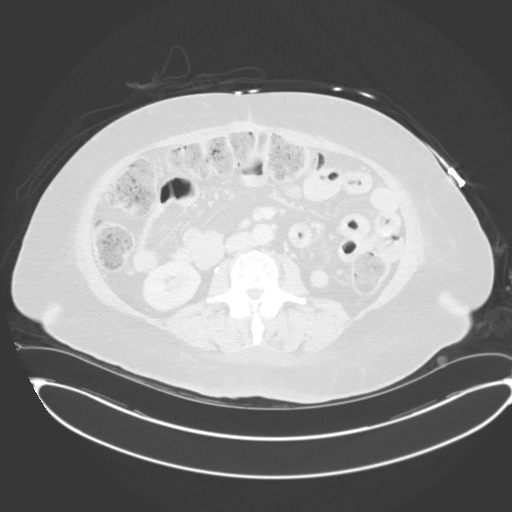
[im 67/123  lung]
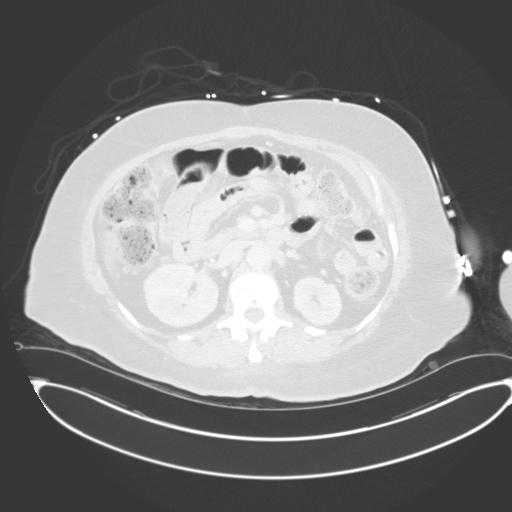
[im 78/123  lung]
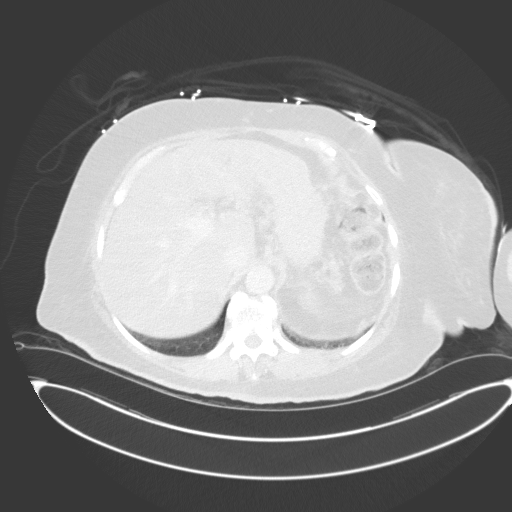
[im 89/123  lung]
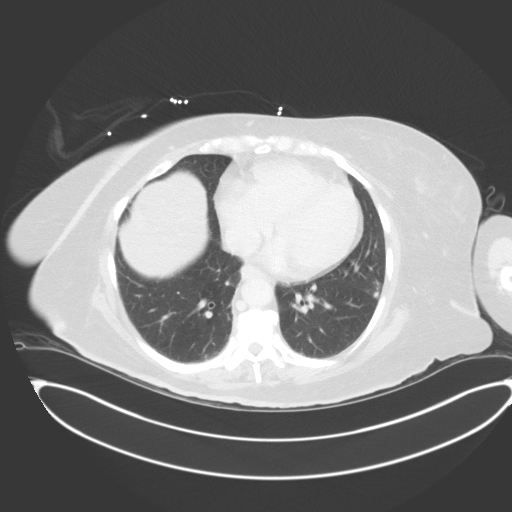
[im 100/123  mediastinal]
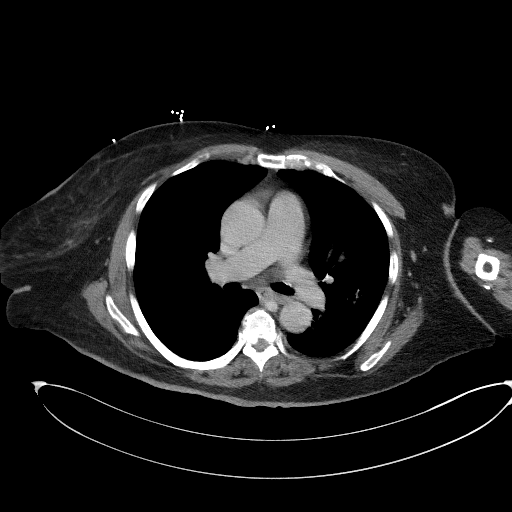
[im 100/123  lung]
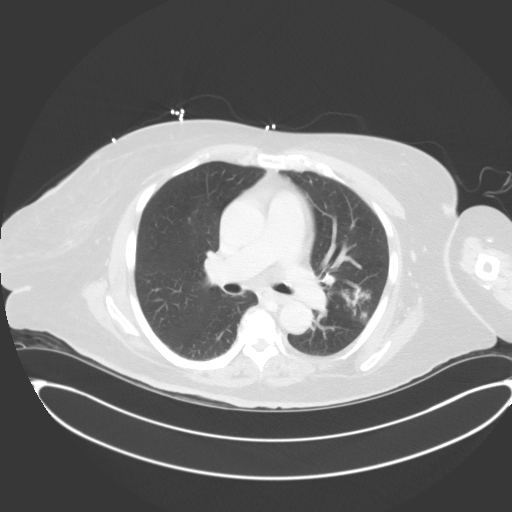
[im 111/123  lung]
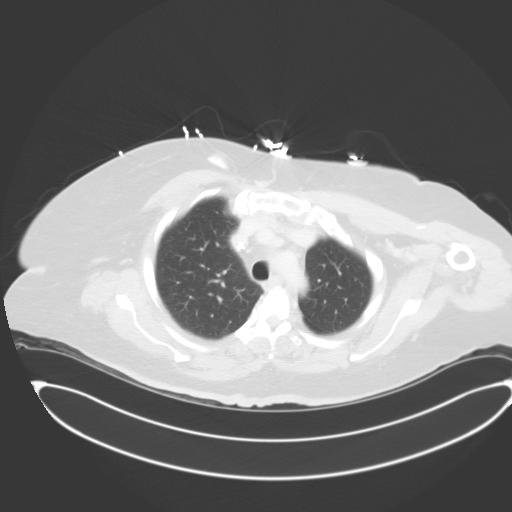

[Series 5: coronals · coronal · 0.96mm/px · 3 of 149 slices shown]
[im 30/149  lung]
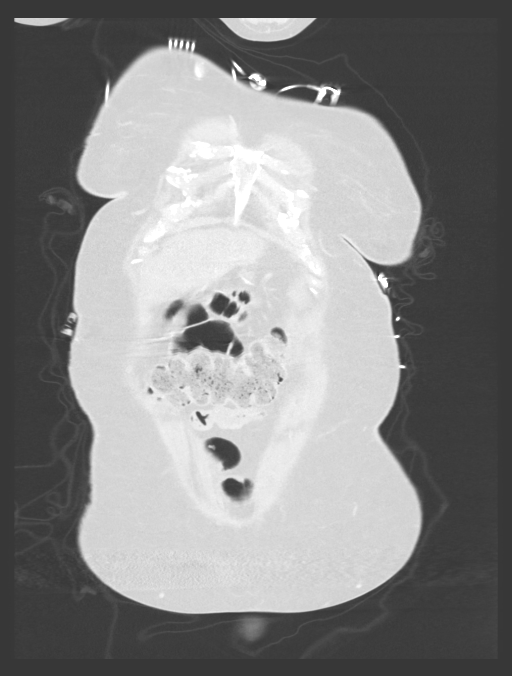
[im 60/149  lung]
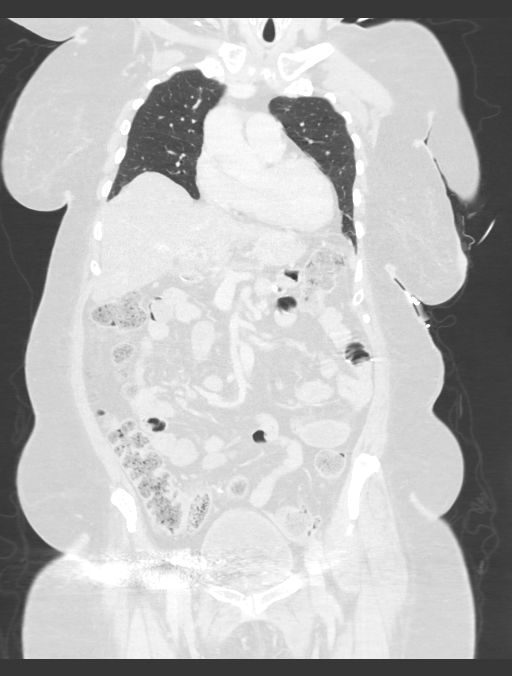
[im 89/149  lung]
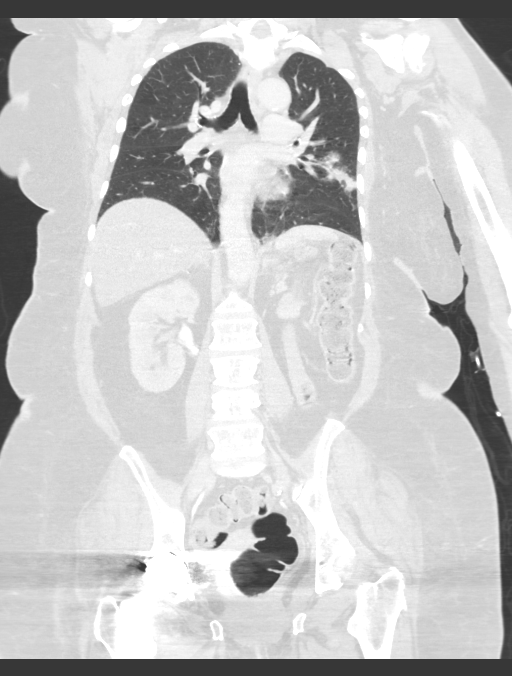

[13 of 36 positions shown; findings below may reference images not displayed]

FINDINGS: CT CHEST FINDINGS

Cardiovascular: Normal heart size. No pericardial effusion. Normal
caliber thoracic aorta. Coronary, aortic arch, and branch vessel
atherosclerotic vascular disease. Right chest wall port catheter
with the tip in the distal SVC.

Mediastinum/Nodes: No enlarged mediastinal, hilar, or axillary lymph
nodes. There is a 1.3 cm hypodense nodule within the right thyroid
lobe with small surrounding punctate calcifications. The trachea and
esophagus demonstrate no significant abnormalities. Small hiatal
hernia, unchanged.

Lungs/Pleura: Patchy consolidation within the left upper lobe. No
pleural effusion or pneumothorax. No suspicious pulmonary nodule.

Musculoskeletal: No chest wall abnormality. No acute or significant
osseous findings.

CT ABDOMEN PELVIS FINDINGS

Hepatobiliary: Diffuse hepatic steatosis. Mild gallbladder
distention without wall thickening or gallstones. No biliary
dilatation.

Pancreas: Atrophic. No pancreatic ductal dilatation or surrounding
inflammatory changes.

Spleen: Normal in size without focal abnormality.

Adrenals/Urinary Tract: Adrenal glands are unremarkable. Kidneys are
normal, without renal calculi, focal lesion, or hydronephrosis.
Bladder is unremarkable.

Stomach/Bowel: Postsurgical changes related to prior distal
antrectomy and gastrojejunostomy with patent anastomosis. No bowel
wall thickening, distention, or surrounding inflammatory changes.
Mild sigmoid diverticulosis. Moderate colonic stool burden. Prior
appendectomy.

Vascular/Lymphatic: Aortic atherosclerosis. No enlarged abdominal or
pelvic lymph nodes.

Reproductive: Status post hysterectomy. No adnexal masses.

Other: No free fluid or pneumoperitoneum. Postsurgical changes along
the anterior abdominal wall.

Musculoskeletal: Prior right total hip arthroplasty. No acute or
significant osseous findings.
IMPRESSION: 1. Left upper lobe bronchopneumonia.
2.  No acute intra-abdominal process.
3. Diffuse hepatic steatosis.
4.  Prominent stool throughout the colon favors constipation.
5.  Aortic atherosclerosis ([id number]).

## 2019-02-15 ENCOUNTER — Other Ambulatory Visit (INDEPENDENT_AMBULATORY_CARE_PROVIDER_SITE_OTHER): Payer: Medicare HMO

## 2019-02-15 ENCOUNTER — Other Ambulatory Visit: Payer: Self-pay

## 2019-02-15 DIAGNOSIS — R1114 Bilious vomiting: Secondary | ICD-10-CM | POA: Diagnosis not present

## 2019-02-15 NOTE — Addendum Note (Signed)
Addended by: Kelle Darting A on: 02/15/2019 10:06 AM   Modules accepted: Orders

## 2019-02-18 ENCOUNTER — Other Ambulatory Visit: Payer: Self-pay | Admitting: Family Medicine

## 2019-02-18 DIAGNOSIS — G894 Chronic pain syndrome: Secondary | ICD-10-CM

## 2019-02-18 LAB — COMPREHENSIVE METABOLIC PANEL
ALT: 23 IU/L (ref 0–32)
AST: 27 IU/L (ref 0–40)
Albumin/Globulin Ratio: 1.1 — ABNORMAL LOW (ref 1.2–2.2)
Albumin: 3.4 g/dL — ABNORMAL LOW (ref 3.8–4.8)
Alkaline Phosphatase: 145 IU/L — ABNORMAL HIGH (ref 39–117)
BUN/Creatinine Ratio: 13 (ref 12–28)
BUN: 19 mg/dL (ref 8–27)
Bilirubin Total: 0.2 mg/dL (ref 0.0–1.2)
CO2: 20 mmol/L (ref 20–29)
Calcium: 9.4 mg/dL (ref 8.7–10.3)
Chloride: 102 mmol/L (ref 96–106)
Creatinine, Ser: 1.5 mg/dL — ABNORMAL HIGH (ref 0.57–1.00)
GFR calc Af Amer: 42 mL/min/{1.73_m2} — ABNORMAL LOW (ref >59)
GFR calc non Af Amer: 36 mL/min/{1.73_m2} — ABNORMAL LOW (ref >59)
Globulin, Total: 3 g/dL (ref 1.5–4.5)
Glucose: 300 mg/dL — ABNORMAL HIGH (ref 65–99)
Potassium: 5.3 mmol/L — ABNORMAL HIGH (ref 3.5–5.2)
Sodium: 140 mmol/L (ref 134–144)
Total Protein: 6.4 g/dL (ref 6.0–8.5)

## 2019-02-18 LAB — CBC WITH DIFFERENTIAL/PLATELET
Basophils Absolute: 0 10*3/uL (ref 0.0–0.2)
Basos: 0 %
EOS (ABSOLUTE): 0.2 10*3/uL (ref 0.0–0.4)
Eos: 2 %
Hematocrit: 42.7 % (ref 34.0–46.6)
Hemoglobin: 13.2 g/dL (ref 11.1–15.9)
Immature Grans (Abs): 0 10*3/uL (ref 0.0–0.1)
Immature Granulocytes: 0 %
Lymphocytes Absolute: 2.8 10*3/uL (ref 0.7–3.1)
Lymphs: 31 %
MCH: 27.6 pg (ref 26.6–33.0)
MCHC: 30.9 g/dL — ABNORMAL LOW (ref 31.5–35.7)
MCV: 89 fL (ref 79–97)
Monocytes Absolute: 0.7 10*3/uL (ref 0.1–0.9)
Monocytes: 8 %
Neutrophils Absolute: 5.4 10*3/uL (ref 1.4–7.0)
Neutrophils: 59 %
Platelets: 371 10*3/uL (ref 150–450)
RBC: 4.78 x10E6/uL (ref 3.77–5.28)
RDW: 14.6 % (ref 11.7–15.4)
WBC: 9.1 10*3/uL (ref 3.4–10.8)

## 2019-02-18 LAB — H. PYLORI ANTIBODY, IGG: H. pylori, IgG AbS: 0.5 Index Value (ref 0.00–0.79)

## 2019-02-18 MED ORDER — TRAMADOL HCL 50 MG PO TABS: ORAL_TABLET | ORAL | 0 refills | Status: DC

## 2019-02-20 ENCOUNTER — Other Ambulatory Visit: Payer: Self-pay

## 2019-02-20 MED ORDER — CENTRUM SILVER ADULT 50+ PO TABS: ORAL_TABLET | ORAL | 1 refills | Status: AC

## 2019-02-20 MED ORDER — ASPIRIN EC 81 MG PO TBEC: 81.0000 mg | DELAYED_RELEASE_TABLET | Freq: Every day | ORAL | 3 refills | Status: DC

## 2019-02-20 MED ORDER — SERTRALINE HCL 50 MG PO TABS: 50.0000 mg | ORAL_TABLET | Freq: Every day | ORAL | 1 refills | Status: AC

## 2019-02-22 ENCOUNTER — Telehealth: Payer: Self-pay | Admitting: *Deleted

## 2019-02-22 ENCOUNTER — Telehealth: Payer: Self-pay

## 2019-02-22 NOTE — Telephone Encounter (Signed)
   Copied from [place] [id number]. Topic: General - Other >> Feb 20, 2019 12:31 PM Celene Kras A wrote: Reason for CRM: Cassandria Santee called stating he sent a fax over regarding dementia panel test, genetic cancer test, and adverse drug reaction test for pt on 02/18/2019. He is requesting a call back at [phone].Luis states the fax could have come from [facility] or [facility]. Please advise.

## 2019-02-22 NOTE — Telephone Encounter (Signed)
Copied from [place] [id number]. Topic: General - Inquiry >> Feb 22, 2019 12:24 PM Reyne Dumas L wrote: Reason for CRM:   Pt calling to get her lab results from earlier in the week.

## 2019-02-22 NOTE — Telephone Encounter (Signed)
[facility] called and asked if we received fax.   Heather did receive fax and it has been signed and faxed back today.

## 2019-02-22 NOTE — Telephone Encounter (Signed)
Spoke with patient. She verbalized understanding. Pt then states she has been experiencing dizziness and unable to walk straight. I advised patient to go to a urgent care and pt refused stating "she didn't want to see someone new and have to explain everything. She is tired of taking drugs". Pt requested an appointment on Monday but I advised patient she needed to be seen sooner. Pt stated she would have her daughter take her to urgent care.     Dr. Jyl Heinz

## 2019-02-27 ENCOUNTER — Telehealth: Payer: Self-pay | Admitting: *Deleted

## 2019-02-27 ENCOUNTER — Other Ambulatory Visit: Payer: Self-pay | Admitting: *Deleted

## 2019-02-27 DIAGNOSIS — D508 Other iron deficiency anemias: Secondary | ICD-10-CM

## 2019-02-27 NOTE — Telephone Encounter (Signed)
Received a call from patient stating that she has been feeling tired, jittery and feels like her iron level is low.  Dr. Marin Olp notified.  Patietn coming tomorrow for port flush and labwork.

## 2019-02-28 ENCOUNTER — Other Ambulatory Visit: Payer: Self-pay | Admitting: *Deleted

## 2019-02-28 ENCOUNTER — Other Ambulatory Visit: Payer: Self-pay

## 2019-02-28 ENCOUNTER — Inpatient Hospital Stay: Payer: Medicare HMO

## 2019-02-28 ENCOUNTER — Inpatient Hospital Stay: Payer: Medicare HMO | Attending: Hematology & Oncology

## 2019-02-28 VITALS — BP 140/116 | HR 83 | Temp 97.7°F | Resp 18

## 2019-02-28 DIAGNOSIS — C162 Malignant neoplasm of body of stomach: Secondary | ICD-10-CM | POA: Diagnosis not present

## 2019-02-28 DIAGNOSIS — D508 Other iron deficiency anemias: Secondary | ICD-10-CM | POA: Diagnosis present

## 2019-02-28 LAB — COMPREHENSIVE METABOLIC PANEL
ALT: 30 U/L (ref 0–44)
AST: 30 U/L (ref 15–41)
Albumin: 2.7 g/dL — ABNORMAL LOW (ref 3.5–5.0)
Alkaline Phosphatase: 137 U/L — ABNORMAL HIGH (ref 38–126)
Anion gap: 6 (ref 5–15)
BUN: 20 mg/dL (ref 8–23)
CO2: 28 mmol/L (ref 22–32)
Calcium: 8 mg/dL — ABNORMAL LOW (ref 8.9–10.3)
Chloride: 108 mmol/L (ref 98–111)
Creatinine, Ser: 1.4 mg/dL — ABNORMAL HIGH (ref 0.44–1.00)
GFR calc Af Amer: 45 mL/min — ABNORMAL LOW (ref >60)
GFR calc non Af Amer: 39 mL/min — ABNORMAL LOW (ref >60)
Glucose, Bld: 178 mg/dL — ABNORMAL HIGH (ref 70–99)
Potassium: 5.2 mmol/L — ABNORMAL HIGH (ref 3.5–5.1)
Sodium: 142 mmol/L (ref 135–145)
Total Bilirubin: 0.3 mg/dL (ref 0.3–1.2)
Total Protein: 5.6 g/dL — ABNORMAL LOW (ref 6.5–8.1)

## 2019-02-28 LAB — CBC WITH DIFFERENTIAL (CANCER CENTER ONLY)
Abs Immature Granulocytes: 0.02 10*3/uL (ref 0.00–0.07)
Basophils Absolute: 0 10*3/uL (ref 0.0–0.1)
Basophils Relative: 0 %
Eosinophils Absolute: 0.2 10*3/uL (ref 0.0–0.5)
Eosinophils Relative: 2 %
HCT: 36.7 % (ref 36.0–46.0)
Hemoglobin: 12.2 g/dL (ref 12.0–15.0)
Immature Granulocytes: 0 %
Lymphocytes Relative: 35 %
Lymphs Abs: 2.7 10*3/uL (ref 0.7–4.0)
MCH: 28.1 pg (ref 26.0–34.0)
MCHC: 33.2 g/dL (ref 30.0–36.0)
MCV: 84.6 fL (ref 80.0–100.0)
Monocytes Absolute: 0.5 10*3/uL (ref 0.1–1.0)
Monocytes Relative: 7 %
Neutro Abs: 4.2 10*3/uL (ref 1.7–7.7)
Neutrophils Relative %: 56 %
Platelet Count: 298 10*3/uL (ref 150–400)
RBC: 4.34 MIL/uL (ref 3.87–5.11)
RDW: 16.2 % — ABNORMAL HIGH (ref 11.5–15.5)
WBC Count: 7.5 10*3/uL (ref 4.0–10.5)
nRBC: 0 % (ref 0.0–0.2)

## 2019-02-28 MED ORDER — SODIUM CHLORIDE 0.9% FLUSH
10.0000 mL | INTRAVENOUS | Status: DC | PRN
  Administered 2019-02-28: 10 mL via INTRAVENOUS
  Filled 2019-02-28: qty 10

## 2019-02-28 MED ORDER — LIDOCAINE-PRILOCAINE 2.5-2.5 % EX CREA
TOPICAL_CREAM | CUTANEOUS | Status: AC
  Filled 2019-02-28: qty 30

## 2019-02-28 MED ORDER — HEPARIN SOD (PORK) LOCK FLUSH 100 UNIT/ML IV SOLN
500.0000 [IU] | Freq: Once | INTRAVENOUS | Status: AC
  Administered 2019-02-28: 500 [IU] via INTRAVENOUS
  Filled 2019-02-28: qty 5

## 2019-02-28 MED ORDER — LIDOCAINE-PRILOCAINE 2.5-2.5 % EX CREA: 1.0000 "application " | TOPICAL_CREAM | CUTANEOUS | 0 refills | Status: AC | PRN

## 2019-02-28 NOTE — Patient Instructions (Signed)

## 2019-03-01 ENCOUNTER — Other Ambulatory Visit: Payer: Self-pay | Admitting: Physician Assistant

## 2019-03-01 ENCOUNTER — Other Ambulatory Visit: Payer: Self-pay | Admitting: Family Medicine

## 2019-03-01 ENCOUNTER — Other Ambulatory Visit: Payer: Self-pay | Admitting: Hematology & Oncology

## 2019-03-01 LAB — IRON AND TIBC
Iron: 45 ug/dL (ref 41–142)
Saturation Ratios: 31 % (ref 21–57)
TIBC: 145 ug/dL — ABNORMAL LOW (ref 236–444)
UIBC: 100 ug/dL — ABNORMAL LOW (ref 120–384)

## 2019-03-01 LAB — FERRITIN: Ferritin: 271 ng/mL (ref 11–307)

## 2019-03-05 ENCOUNTER — Ambulatory Visit: Payer: Self-pay | Admitting: Family Medicine

## 2019-03-05 NOTE — Telephone Encounter (Signed)
Appt scheduled

## 2019-03-05 NOTE — Telephone Encounter (Signed)
Pt reports 2 bruises on left side of abdomen; one nickel size and one half dollar size. States just noticed this AM. Reports tender to touch. States concerned "Because this is how my stomach got when I was on blood thinners in February."  Pt is on Plavix 45m. No other bruising or bleeding. Also reports "Yeast like rash right groin area, "Burning." States "A little lightheaded sometimes." Reports had blood work done 02/28/2019 and iron level "OK". CAll transferred to practice for consideration of appt; pt is able to do virtual appt.  CB# [phone]  Reason for Disposition . [1] Not caused by an injury AND [2] < 5 unexplained bruises  Answer Assessment - Initial Assessment Questions 1. APPEARANCE of BRUISE: "Describe the bruise."     2 spots, one as big as 50 cent piece, one size of nickel 2. SIZE: "How large is the bruise?"       3. NUMBER: "How many bruises are there?"      2 4. LOCATION: "Where is the bruise located?"      Left side 5. ONSET: "How long ago did the bruise occur?"      Not sure 6. CAUSE: "Tell me how it happened."    unsure 7. MEDICAL HISTORY: "Do you have any medical problems that can cause easy bruising or bleeding?" (e.g., leukemia, liver disease, recent chemotherapy)     On Plavix 8. MEDICATIONS : "Do you take any medications which thin the blood such as: aspirin, heparin, ibuprofen (NSAIDS), Plavix, or Coumadin?"    Plavix 780m9. OTHER SYMPTOMS: "Do you have any other symptoms?"  (e.g., weakness, dizziness, pain, fever, nosebleed, blood in urine/stool)     Lightheaded at times, blood work done last week "All ok"  Protocols used: [employer]

## 2019-03-06 ENCOUNTER — Other Ambulatory Visit: Payer: Self-pay

## 2019-03-06 ENCOUNTER — Ambulatory Visit (INDEPENDENT_AMBULATORY_CARE_PROVIDER_SITE_OTHER): Payer: Medicare HMO | Admitting: Family Medicine

## 2019-03-06 ENCOUNTER — Encounter: Payer: Self-pay | Admitting: Family Medicine

## 2019-03-06 DIAGNOSIS — S301XXA Contusion of abdominal wall, initial encounter: Secondary | ICD-10-CM

## 2019-03-06 NOTE — Progress Notes (Signed)
Virtual Visit via Video Note  I connected with [name] on 03/06/19 at 11:40 AM EDT by a video enabled telemedicine application and verified that I am speaking with the correct person using two identifiers.  Location: Patient: home  Provider: home    I discussed the limitations of evaluation and management by telemedicine and the availability of in person appointments. The patient expressed understanding and agreed to proceed.  History of Present Illness: Pt is home c/o bruising on her abd x 1 week.   She has some new and some old  No trauma.   She is taking plavix and aspirin (since feb) Labs done with hematology last week were normal.  She did not mention the bruising cause she did not notice it until after she was there.   No other comlaints      Observations/Objective: .no vitals obtained Pt in NAD smal bruise on abd seen and another one that was healing   Assessment and Plan: 1  brusing Pt is on plavix and asa-- I feel it is from this She will discuss with hematology  Use warm compresses   Follow Up Instructions:    I discussed the assessment and treatment plan with the patient. The patient was provided an opportunity to ask questions and all were answered. The patient agreed with the plan and demonstrated an understanding of the instructions.   The patient was advised to call back or seek an in-person evaluation if the symptoms worsen or if the condition fails to improve as anticipated.  I provided 15 minutes of non-face-to-face time during this encounter.   Ann Held, DO

## 2019-03-07 ENCOUNTER — Emergency Department (HOSPITAL_BASED_OUTPATIENT_CLINIC_OR_DEPARTMENT_OTHER): Payer: Medicare HMO

## 2019-03-07 ENCOUNTER — Other Ambulatory Visit: Payer: Self-pay

## 2019-03-07 ENCOUNTER — Encounter (HOSPITAL_BASED_OUTPATIENT_CLINIC_OR_DEPARTMENT_OTHER): Payer: Self-pay | Admitting: *Deleted

## 2019-03-07 ENCOUNTER — Emergency Department (HOSPITAL_BASED_OUTPATIENT_CLINIC_OR_DEPARTMENT_OTHER)
Admission: EM | Admit: 2019-03-07 | Discharge: 2019-03-07 | Disposition: A | Discharge status: Home / Self Care | Payer: Medicare HMO | Attending: Emergency Medicine | Admitting: Emergency Medicine

## 2019-03-07 DIAGNOSIS — E042 Nontoxic multinodular goiter: Secondary | ICD-10-CM | POA: Diagnosis not present

## 2019-03-07 DIAGNOSIS — Z23 Encounter for immunization: Secondary | ICD-10-CM | POA: Diagnosis not present

## 2019-03-07 DIAGNOSIS — M25552 Pain in left hip: Secondary | ICD-10-CM | POA: Diagnosis not present

## 2019-03-07 DIAGNOSIS — S80212A Abrasion, left knee, initial encounter: Secondary | ICD-10-CM | POA: Diagnosis not present

## 2019-03-07 DIAGNOSIS — Y999 Unspecified external cause status: Secondary | ICD-10-CM | POA: Diagnosis not present

## 2019-03-07 DIAGNOSIS — S199XXA Unspecified injury of neck, initial encounter: Secondary | ICD-10-CM | POA: Diagnosis not present

## 2019-03-07 DIAGNOSIS — M25562 Pain in left knee: Secondary | ICD-10-CM | POA: Insufficient documentation

## 2019-03-07 DIAGNOSIS — Y92009 Unspecified place in unspecified non-institutional (private) residence as the place of occurrence of the external cause: Secondary | ICD-10-CM | POA: Diagnosis not present

## 2019-03-07 DIAGNOSIS — Z794 Long term (current) use of insulin: Secondary | ICD-10-CM | POA: Diagnosis not present

## 2019-03-07 DIAGNOSIS — E1122 Type 2 diabetes mellitus with diabetic chronic kidney disease: Secondary | ICD-10-CM | POA: Insufficient documentation

## 2019-03-07 DIAGNOSIS — S8992XA Unspecified injury of left lower leg, initial encounter: Secondary | ICD-10-CM | POA: Diagnosis not present

## 2019-03-07 DIAGNOSIS — Y9389 Activity, other specified: Secondary | ICD-10-CM | POA: Insufficient documentation

## 2019-03-07 DIAGNOSIS — I10 Essential (primary) hypertension: Secondary | ICD-10-CM | POA: Diagnosis not present

## 2019-03-07 DIAGNOSIS — S0990XA Unspecified injury of head, initial encounter: Secondary | ICD-10-CM | POA: Insufficient documentation

## 2019-03-07 DIAGNOSIS — T148XXA Other injury of unspecified body region, initial encounter: Secondary | ICD-10-CM | POA: Diagnosis not present

## 2019-03-07 DIAGNOSIS — S79912A Unspecified injury of left hip, initial encounter: Secondary | ICD-10-CM | POA: Diagnosis not present

## 2019-03-07 DIAGNOSIS — I129 Hypertensive chronic kidney disease with stage 1 through stage 4 chronic kidney disease, or unspecified chronic kidney disease: Secondary | ICD-10-CM | POA: Insufficient documentation

## 2019-03-07 DIAGNOSIS — Z7901 Long term (current) use of anticoagulants: Secondary | ICD-10-CM | POA: Insufficient documentation

## 2019-03-07 DIAGNOSIS — S0003XA Contusion of scalp, initial encounter: Secondary | ICD-10-CM | POA: Diagnosis not present

## 2019-03-07 DIAGNOSIS — N183 Chronic kidney disease, stage 3 (moderate): Secondary | ICD-10-CM | POA: Diagnosis not present

## 2019-03-07 DIAGNOSIS — W108XXA Fall (on) (from) other stairs and steps, initial encounter: Secondary | ICD-10-CM | POA: Insufficient documentation

## 2019-03-07 MED ORDER — BACITRACIN ZINC 500 UNIT/GM EX OINT: TOPICAL_OINTMENT | Freq: Once | CUTANEOUS | Status: DC

## 2019-03-07 MED ORDER — TETANUS-DIPHTH-ACELL PERTUSSIS 5-2.5-18.5 LF-MCG/0.5 IM SUSP
0.5000 mL | Freq: Once | INTRAMUSCULAR | Status: AC
  Administered 2019-03-07: 0.5 mL via INTRAMUSCULAR
  Filled 2019-03-07: qty 0.5

## 2019-03-07 NOTE — ED Notes (Signed)
Pt ambulated in department with walker- pt had steady gait. Pt also reports having a walker at home to use.

## 2019-03-07 NOTE — Discharge Instructions (Signed)
It was my pleasure taking care of you today!   Keep your wound on your head and knee clean. Wash with soap and water twice daily. Dry well.   Tylenol as needed for pain.   Follow-up with your primary care doctor.   Return to ER for new or worsening symptoms, any additional concerns.

## 2019-03-07 NOTE — ED Triage Notes (Signed)
Pt c/o fall 1 hr ago c/o abrasion to left knee and head injury , denies LOC , pt is on plavix

## 2019-03-07 NOTE — ED Provider Notes (Signed)
[facility] EMERGENCY DEPARTMENT Provider Note   CSN: [id number] Arrival date & time: 03/07/19  1817    History   Chief Complaint Chief Complaint  Patient presents with   Fall    HPI [name] is a 68 y.o. female.     The history is provided by the patient and medical records. No language interpreter was used.   [name] is a 68 y.o. female  with a PMH as listed below who presents to the Emergency Department for evaluation after fall just prior to arrival.  Patient states that she was at her daughter's house and went to take a step down, however there were no banisters to help keep her balance.  When trying to go down the stairs, she fell onto her left knee/hip and did strike the left side of her head.  No loss of consciousness.  No nausea or vomiting.  No medications prior to arrival for symptoms.  Complaining of left-sided headache as well as left knee and hip pain.  Unsure of her last tetanus vaccine.  Past Medical History:  Diagnosis Date   Asthma    Degenerative joint disease    Diabetes mellitus    Dyspnea    GERD (gastroesophageal reflux disease)    Hyperlipidemia    no longer on medication for this   Hypertension    patient denies   Myocardial infarction ([place]) 08/2018   Neuropathy    bilateral hands and feet   Sickle cell trait ([place])    Sleep apnea    stomach ca dx'd 2010   chemo/xrt comp 05/2009   TIA (transient ischemic attack) 07/2015    Patient Active Problem List   Diagnosis Date Noted   Vitamin D deficiency 11/09/2018   Muscle spasm 10/19/2018   Chest pain 09/07/2018   Palpitations 09/07/2018   Other fatigue 09/07/2018   Acute ST elevation myocardial infarction (STEMI) of inferior wall ([place]) 08/27/2018    Class: Hospitalized for   Coronary artery disease involving native coronary artery of native heart with unstable angina pectoris ([place]) 08/27/2018    Class: Diagnosis of   Acute vaginitis 08/20/2018    Hyperlipidemia associated with type 2 diabetes mellitus ([place]) 04/09/2018   Bronchopneumonia 11/02/2017   Sepsis ([place]) 11/02/2017   Leucocytosis 11/02/2017   Moderate persistent asthma with acute exacerbation 05/26/2017   Influenza A 05/26/2017   Acute pain of right shoulder 03/07/2017   Acute bilateral low back pain without sciatica 03/07/2017   Chronic pain 03/07/2017   Fatty liver 03/24/2016   Weakness 03/23/2016   Unexplained weight loss 03/23/2016   Melena 03/23/2016   Hypotension 02/03/2016   Syncope 02/03/2016   Hypomagnesemia 02/03/2016   AKI (acute kidney injury) ([place]) 02/03/2016   Gastrointestinal dysmotility 01/20/2016   CKD (chronic kidney disease) stage 3, GFR 30-59 ml/min (HCC) 01/20/2016   Abscess of right thigh 01/20/2016   Chronic pain syndrome 01/15/2016   Diarrhea 01/15/2016   Generalized abdominal pain 01/14/2016   Type 2 diabetes with nephropathy ([place]) 12/30/2015   Morbid obesity due to excess calories ([place]) 05/05/2015   Diabetic polyneuropathy associated with type 2 diabetes mellitus ([place]) 01/02/2015   Acute bacterial sinusitis 01/02/2015   Onychomycosis 11/04/2014   Pain in lower limb 11/04/2014   Multinodular goiter (nontoxic) 10/16/2014   History of CVA (cerebrovascular accident) 08/11/2014   Iron deficiency anemia 05/20/2014   Major depressive disorder, single episode, moderate ([place]) 05/20/2014   Dizziness of unknown cause 04/28/2014   MDD (major  depressive disorder), recurrent episode, moderate ([place]) 01/28/2014   GERD (gastroesophageal reflux disease) 01/02/2014   Nausea 10/15/2012   Stomach cancer ([place]) 12/10/2008   CHRONIC RESPIRATORY FAILURE 01/03/2008   Hyperlipidemia 06/27/2007   OBESITY, MORBID 06/27/2007   SLEEP APNEA, OBSTRUCTIVE 06/27/2007   Essential hypertension 06/27/2007   Asthma 06/27/2007   OSTEOARTHRITIS  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 06/27/2007    Past Surgical History:  Procedure  Laterality Date   APPENDECTOMY     CESAREAN SECTION     COLON SURGERY     colonscopy   COLONOSCOPY WITH PROPOFOL N/A 06/01/2016   Procedure: COLONOSCOPY WITH PROPOFOL;  Surgeon: Wilford Corner, MD;  Location: [facility]COPY;  Service: Endoscopy;  Laterality: N/A;   CORONARY STENT INTERVENTION N/A 08/29/2018   Procedure: CORONARY STENT INTERVENTION;  Surgeon: Wellington Hampshire, MD;  Location: [facility] CV LAB;  Service: Cardiovascular;  Laterality: N/A;   CORONARY/GRAFT ACUTE MI REVASCULARIZATION N/A 08/27/2018   Procedure: Coronary/Graft Acute MI Revascularization;  Surgeon: Leonie Man, MD;  Location: [facility] CV LAB;  Service: Cardiovascular;  Laterality: N/A;   ESOPHAGOGASTRODUODENOSCOPY N/A 10/15/2012   Procedure: ESOPHAGOGASTRODUODENOSCOPY (EGD);  Surgeon: Lear Ng, MD;  Location: [facility] ENDOSCOPY;  Service: Endoscopy;  Laterality: N/A;   ESOPHAGOGASTRODUODENOSCOPY N/A 01/15/2016   Procedure: ESOPHAGOGASTRODUODENOSCOPY (EGD);  Surgeon: Ronald Lobo, MD;  Location: [facility] ENDOSCOPY;  Service: Endoscopy;  Laterality: N/A;   HERNIA REPAIR     LEFT HEART CATH AND CORONARY ANGIOGRAPHY N/A 08/27/2018   Procedure: LEFT HEART CATH AND CORONARY ANGIOGRAPHY;  Surgeon: Leonie Man, MD;  Location: [facility] CV LAB;  Service: Cardiovascular;  Laterality: N/A;   SHOULDER SURGERY     Left   TOTAL HIP ARTHROPLASTY     Right   TOTAL KNEE ARTHROPLASTY     Left     OB History    Gravida  4   Para  4   Term      Preterm      AB      Living  4     SAB      TAB      Ectopic      Multiple      Live Births               Home Medications    Prior to Admission medications   Medication Sig Start Date End Date Taking? Authorizing Provider  albuterol (PROVENTIL HFA;VENTOLIN HFA) 108 (90 Base) MCG/ACT inhaler Inhale 2 puffs into the lungs every 6 (six) hours as needed for wheezing or shortness of breath. 11/05/17   Roxan Hockey, MD  albuterol  (PROVENTIL) (2.5 MG/3ML) 0.083% nebulizer solution Take 3 mLs (2.5 mg total) by nebulization every 6 (six) hours as needed for wheezing or shortness of breath. 11/05/17   Roxan Hockey, MD  aspirin EC 81 MG tablet Take 1 tablet (81 mg total) by mouth daily. 02/20/19   Ann Held, DO  cephALEXin (KEFLEX) 500 MG capsule Take 1 capsule (500 mg total) by mouth 2 (two) times daily. 02/12/19   Roma Schanz R, DO  clopidogrel (PLAVIX) 75 MG tablet TAKE 4 TABLETS BY MOUTH ON DAY 1 THEN, 1TAB DAILY 02/06/19   Lendon Colonel, NP  famotidine (PEPCID) 40 MG tablet Take 1 tablet (40 mg total) by mouth daily. 02/12/19   Roma Schanz R, DO  fluticasone (FLONASE) 50 MCG/ACT nasal spray Place 2 sprays into both nostrils daily. 04/18/18   [place]  Lyndal Pulley R, DO  gabapentin (NEURONTIN) 800 MG tablet TAKE ONE TABLET BY MOUTH THREE TIMES DAILY ;; 03/06/19   Carollee Herter, Alferd Apa, DO  glimepiride (AMARYL) 4 MG tablet Take 1 tablet (4 mg total) by mouth 2 (two) times daily. 01/24/19   Ann Held, DO  glucose blood (ONETOUCH VERIO) test strip Use as instructed to check blood sugar 3 times a day 09/04/18   Philemon Kingdom, MD  Insulin Degludec (TRESIBA FLEXTOUCH) 200 UNIT/ML SOPN Inject 20 Units into the skin daily. 08/31/18   Philemon Kingdom, MD  Insulin Pen Needle (B-D UF III MINI PEN NEEDLES) 31G X 5 MM MISC Use to inject insulin once a day. 01/07/19   Philemon Kingdom, MD  ipratropium-albuterol (DUONEB) 0.5-2.5 (3) MG/3ML SOLN Take 3 mLs by nebulization every 6 (six) hours as needed. Patient taking differently: Take 3 mLs by nebulization every 6 (six) hours as needed (sob and wheezing).  05/26/17   Ann Held, DO  isosorbide mononitrate (IMDUR) 30 MG 24 hr tablet Take 0.5 tablets (15 mg total) by mouth daily. 01/14/19   Lorretta Harp, MD  lidocaine-prilocaine (EMLA) cream Apply 1 application topically as needed. 02/28/19   Volanda Napoleon, MD  metFORMIN (GLUCOPHAGE) 500  MG tablet Take 1 tablet (500 mg total) by mouth 2 (two) times daily with a meal. 02/13/19   Philemon Kingdom, MD  methocarbamol (ROBAXIN) 500 MG tablet Take 1 tablet (500 mg total) by mouth every 6 (six) hours as needed for muscle spasms. 10/19/18   Ann Held, DO  metoprolol tartrate (LOPRESSOR) 25 MG tablet Take 1 tablet (25 mg total) by mouth 2 (two) times daily. 01/14/19   Lorretta Harp, MD  Multiple Vitamin (MULTIVITAMIN WITH MINERALS) TABS tablet Take 1 tablet by mouth daily. 09/13/18   Ann Held, DO  Multiple Vitamins-Minerals (CENTRUM SILVER ADULT 50+) TABS Take 1 tablet once a day 02/20/19   Carollee Herter, Alferd Apa, DO  nitroGLYCERIN (NITROSTAT) 0.4 MG SL tablet Place 1 tablet (0.4 mg total) under the tongue every 5 (five) minutes as needed. 01/14/19   Lorretta Harp, MD  ondansetron (ZOFRAN) 4 MG tablet TAKE ONE TABLET BY MOUTH EVERY EIGHT HOURS AS NEEDED FOR NAUSEA AND VOMITING 03/01/19   Volanda Napoleon, MD  pantoprazole (PROTONIX) 40 MG tablet Take 1 tablet (40 mg total) by mouth daily. 01/02/19   Roma Schanz R, DO  rosuvastatin (CRESTOR) 40 MG tablet TAKE ONE TABLET BY MOUTH EVERY DAY AT BEDTIME 03/01/19   Leonie Man, MD  sertraline (ZOLOFT) 50 MG tablet Take 1 tablet (50 mg total) by mouth daily. 02/20/19   Ann Held, DO  sucralfate (CARAFATE) 1 g tablet TAKE 1 TABLET BY MOUTH TWICE DAILY 02/05/19   [place], Holli Humbles, NP  traMADol (ULTRAM) 50 MG tablet TAKE 2 TABLETS BY MOUTH 3 TIMES DAILY AS NEEDED FOR MODERATE PAIN 02/18/19   Ann Held, DO    Family History Family History  Problem Relation Age of Onset   Diabetes Brother    Alzheimer's disease Father 28   Cancer Mother 65   Diabetes Maternal Grandmother    Cancer Maternal Grandfather        lung   Suicidality Neg Hx    Depression Neg Hx    Dementia Neg Hx    Anxiety disorder Neg Hx     Social History Social History   Tobacco Use   Smoking status:  Former Smoker    Packs/day: 1.00    Years: 15.00    Pack years: 15.00    Types: Cigarettes    Start date: 09/27/1973    Quit date: 10/15/1988    Years since quitting: 30.4   Smokeless tobacco: Never Used   Tobacco comment: quit smoking 14 years ago  Substance Use Topics   Alcohol use: No    Alcohol/week: 0.0 standard drinks   Drug use: No     Allergies   Adhesive [tape], Codeine, Zocor [simvastatin - high dose], and Penicillins   Review of Systems Review of Systems  Musculoskeletal: Positive for arthralgias and myalgias.  Skin: Positive for wound.  Neurological: Positive for headaches. Negative for dizziness, syncope, weakness, light-headedness and numbness.  All other systems reviewed and are negative.    Physical Exam Updated Vital Signs BP 116/71 (BP Location: Right Arm)    Pulse 76    Temp 99.7 F (37.6 C)    Resp 16    Ht 5' 6"  (1.676 m)    Wt 97.1 kg    SpO2 99%    BMI 34.54 kg/m   Physical Exam Vitals signs and nursing note reviewed.  Constitutional:      General: She is not in acute distress.    Appearance: She is well-developed.  HENT:     Head: Normocephalic.   Neck:     Musculoskeletal: Neck supple.     Comments: No midline or paraspinal tenderness. Cardiovascular:     Rate and Rhythm: Normal rate and regular rhythm.     Heart sounds: Normal heart sounds. No murmur.  Pulmonary:     Effort: Pulmonary effort is normal. No respiratory distress.     Breath sounds: Normal breath sounds.  Abdominal:     General: There is no distension.     Palpations: Abdomen is soft.     Tenderness: There is no abdominal tenderness.  Musculoskeletal:     Comments: Mild tenderness to the left lateral hip without overlying skin changes.  Has full range of motion of the hip.  Has diffuse anterior knee tenderness as well but again has full range of motion of the knee joint.  5/5 muscle strength in all 4 extremities.   Skin:    General: Skin is warm and dry.      Comments: Abrasion just below the left knee.  Neurological:     Mental Status: She is alert and oriented to person, place, and time.     Comments: Speech clear and goal oriented. CN 2-12 grossly intact. Normal finger-to-nose and rapid alternating movements. No drift. Strength and sensation intact.      ED Treatments / Results  Labs (all labs ordered are listed, but only abnormal results are displayed) Labs Reviewed - No data to display  EKG EKG Interpretation  Date/Time:  Thursday March 07 2019 20:33:08 EDT Ventricular Rate:  75 PR Interval:    QRS Duration: 92 QT Interval:  400 QTC Calculation: 447 R Axis:   -20 Text Interpretation:  Sinus rhythm Borderline left axis deviation Consider anterior infarct no sig change Confirmed by Charlesetta Shanks [id number]) on 03/07/2019 8:58:53 PM   Radiology Ct Head Wo Contrast  Result Date: 03/07/2019 CLINICAL DATA:  Fall.  Head injury. EXAM: CT HEAD WITHOUT CONTRAST CT CERVICAL SPINE WITHOUT CONTRAST TECHNIQUE: Multidetector CT imaging of the head and cervical spine was performed following the standard protocol without intravenous contrast. Multiplanar CT image reconstructions of the cervical spine were also generated. COMPARISON:  Head CT 03/19/2018 FINDINGS: CT HEAD FINDINGS Brain: There is no evidence for acute hemorrhage, hydrocephalus, mass lesion, or abnormal extra-axial fluid collection. No definite CT evidence for acute infarction. Vascular: No hyperdense vessel or unexpected calcification. Skull: No evidence for fracture. No worrisome lytic or sclerotic lesion. Sinuses/Orbits: The visualized paranasal sinuses and mastoid air cells are clear. Visualized portions of the globes and intraorbital fat are unremarkable. Other: High left parietal scalp contusion with soft tissue gas suggesting associated laceration. CT CERVICAL SPINE FINDINGS Alignment: Straightening of normal cervical lordosis. Skull base and vertebrae: No acute fracture. No primary  bone lesion or focal pathologic process. Soft tissues and spinal canal: No prevertebral fluid or swelling. No visible canal hematoma. Bilateral thyroid nodules measuring up to 1.8 cm on the right. Disc levels: Loss of disc height noted at C6-7. Facets are well aligned bilaterally. Upper chest: Unremarkable. Other: None. IMPRESSION: 1. No acute intracranial abnormality. 2. Left scalp contusion with associated laceration. 3. No cervical spine fracture. Loss of cervical lordosis. This can be related to patient positioning, muscle spasm or soft tissue injury. 4. Bilateral thyroid nodules measuring up to 1.8 cm in the right lobe. Thyroid ultrasound recommended to further evaluate. This follows ACR consensus guidelines: Managing Incidental Thyroid Nodules Detected on Imaging: White Paper of the ACR Incidental Thyroid Findings Committee. J Am Coll Radiol 2015; 12:143-150. Electronically Signed   By: Misty Stanley M.D.   On: 03/07/2019 19:11   Ct Cervical Spine Wo Contrast  Result Date: 03/07/2019 CLINICAL DATA:  Fall.  Head injury. EXAM: CT HEAD WITHOUT CONTRAST CT CERVICAL SPINE WITHOUT CONTRAST TECHNIQUE: Multidetector CT imaging of the head and cervical spine was performed following the standard protocol without intravenous contrast. Multiplanar CT image reconstructions of the cervical spine were also generated. COMPARISON:  Head CT 03/19/2018 FINDINGS: CT HEAD FINDINGS Brain: There is no evidence for acute hemorrhage, hydrocephalus, mass lesion, or abnormal extra-axial fluid collection. No definite CT evidence for acute infarction. Vascular: No hyperdense vessel or unexpected calcification. Skull: No evidence for fracture. No worrisome lytic or sclerotic lesion. Sinuses/Orbits: The visualized paranasal sinuses and mastoid air cells are clear. Visualized portions of the globes and intraorbital fat are unremarkable. Other: High left parietal scalp contusion with soft tissue gas suggesting associated laceration. CT  CERVICAL SPINE FINDINGS Alignment: Straightening of normal cervical lordosis. Skull base and vertebrae: No acute fracture. No primary bone lesion or focal pathologic process. Soft tissues and spinal canal: No prevertebral fluid or swelling. No visible canal hematoma. Bilateral thyroid nodules measuring up to 1.8 cm on the right. Disc levels: Loss of disc height noted at C6-7. Facets are well aligned bilaterally. Upper chest: Unremarkable. Other: None. IMPRESSION: 1. No acute intracranial abnormality. 2. Left scalp contusion with associated laceration. 3. No cervical spine fracture. Loss of cervical lordosis. This can be related to patient positioning, muscle spasm or soft tissue injury. 4. Bilateral thyroid nodules measuring up to 1.8 cm in the right lobe. Thyroid ultrasound recommended to further evaluate. This follows ACR consensus guidelines: Managing Incidental Thyroid Nodules Detected on Imaging: White Paper of the ACR Incidental Thyroid Findings Committee. J Am Coll Radiol 2015; 12:143-150. Electronically Signed   By: Misty Stanley M.D.   On: 03/07/2019 19:11   Dg Knee Complete 4 Views Left  Result Date: 03/07/2019 CLINICAL DATA:  Fall with abrasions anterior left knee, history of left knee replacement EXAM: LEFT KNEE - COMPLETE 4+ VIEW COMPARISON:  Radiographs January 14, 2015 FINDINGS: Postsurgical changes from prior total left  knee arthroplasty with posterior patellar resurfacing. No evidence of hardware loosening or failure. Alignment of the prosthetic components is satisfactory. No acute fracture or traumatic malalignment. Mild soft tissue thickening seen anteriorly in the knee without sizable joint effusion or other acute soft tissue abnormality IMPRESSION: No acute osseous abnormality. Mild soft tissue swelling seen anteriorly. Prior total left knee arthroplasty without evidence of hardware complication. Electronically Signed   By: Lovena Le M.D.   On: 03/07/2019 20:03   Dg Hip Unilat W Or Wo  Pelvis 2-3 Views Left  Result Date: 03/07/2019 CLINICAL DATA:  Fall EXAM: DG HIP (WITH OR WITHOUT PELVIS) 2-3V LEFT COMPARISON:  None. FINDINGS: SI joints are non widened. Pubic symphysis is intact. Right hip replacement with normal alignment. No fracture or dislocation. IMPRESSION: No acute osseous abnormality Electronically Signed   By: Donavan Foil M.D.   On: 03/07/2019 20:03    Procedures Procedures (including critical care time)  Medications Ordered in ED Medications  bacitracin ointment (has no administration in time range)  Tdap (BOOSTRIX) injection 0.5 mL (0.5 mLs Intramuscular Given 03/07/19 1904)     Initial Impression / Assessment and Plan / ED Course  I have reviewed the triage vital signs and the nursing notes.  Pertinent labs & imaging results that were available during my care of the patient were reviewed by me and considered in my medical decision making (see chart for details).       [name] is a 68 y.o. female who presents to ED for evaluation after fall while trying to walk down the stairs without banister just prior to arrival.  On exam, she does have tenderness to the left hip and knee.  No cranial nerve deficits.  Has a large hematoma to the left side of her head with an abrasion that is actively bleeding.  The area was thoroughly cleaned and irrigated in the emergency department.  Bleeding was controlled with direct pressure. There is no laceration that is amenable to suturing present.  Dressing applied.  CT scans show scalp contusion, but otherwise no acute findings.  Plain films are unremarkable as well.  She did ambulate in the emergency department with steady gait without much difficulty. Evaluation does not show pathology that would require ongoing emergent intervention or inpatient treatment.  Reasons to return to the emergency department were discussed, PCP follow-up encouraged.  All questions answered.  Patient discussed with Dr. Johnney Killian who agrees with  treatment plan.    Final Clinical Impressions(s) / ED Diagnoses   Final diagnoses:  Minor head injury, initial encounter  Abrasion  Left hip pain  Acute pain of left knee    ED Discharge Orders    None       Haik Mahoney, Ozella Almond, PA-C 03/07/19 2151    Charlesetta Shanks, MD 03/16/19 2220

## 2019-03-07 NOTE — ED Notes (Signed)
Left knee abrasions, cleansed. Ice pack applied to hematoma to top of head.

## 2019-03-11 DIAGNOSIS — I25110 Atherosclerotic heart disease of native coronary artery with unstable angina pectoris: Secondary | ICD-10-CM | POA: Diagnosis not present

## 2019-03-11 DIAGNOSIS — I259 Chronic ischemic heart disease, unspecified: Secondary | ICD-10-CM | POA: Diagnosis not present

## 2019-03-11 DIAGNOSIS — Z85028 Personal history of other malignant neoplasm of stomach: Secondary | ICD-10-CM | POA: Diagnosis not present

## 2019-03-11 DIAGNOSIS — Z7902 Long term (current) use of antithrombotics/antiplatelets: Secondary | ICD-10-CM | POA: Diagnosis not present

## 2019-03-11 DIAGNOSIS — Z86010 Personal history of colonic polyps: Secondary | ICD-10-CM | POA: Diagnosis not present

## 2019-03-11 DIAGNOSIS — Z1509 Genetic susceptibility to other malignant neoplasm: Secondary | ICD-10-CM | POA: Diagnosis not present

## 2019-03-11 DIAGNOSIS — Z803 Family history of malignant neoplasm of breast: Secondary | ICD-10-CM | POA: Diagnosis not present

## 2019-03-11 DIAGNOSIS — R69 Illness, unspecified: Secondary | ICD-10-CM | POA: Diagnosis not present

## 2019-03-14 ENCOUNTER — Encounter: Payer: Self-pay | Admitting: Family Medicine

## 2019-03-14 ENCOUNTER — Other Ambulatory Visit: Payer: Self-pay

## 2019-03-14 ENCOUNTER — Ambulatory Visit (INDEPENDENT_AMBULATORY_CARE_PROVIDER_SITE_OTHER): Payer: Medicare HMO | Admitting: Family Medicine

## 2019-03-14 DIAGNOSIS — S0003XA Contusion of scalp, initial encounter: Secondary | ICD-10-CM | POA: Insufficient documentation

## 2019-03-14 NOTE — Assessment & Plan Note (Signed)
Warm compresses If it does not con't to improve pt will come into office

## 2019-03-14 NOTE — Progress Notes (Signed)
Virtual Visit via Video Note  I connected with ELAYJAH CHANEY on 03/14/19 at  1:00 PM EDT by a video enabled telemedicine application and verified that I am speaking with the correct person using two identifiers.  Location: Patient: home  Provider: office    I discussed the limitations of evaluation and management by telemedicine and the availability of in person appointments. The patient expressed understanding and agreed to proceed.  History of Present Illness: Pt is home -- she is following up from er.  She fell leaving her new apartment and still has a hematoma on her scalp-- it is getting better  No other complaints.     Observations/Objective: No vitals obtained Pt is in NAD Lost video half way through call    Assessment and Plan: 1. Hematoma of scalp, initial encounter Warm compresses If it does not con't to improve she will come into the office  Er visit reviewed    Follow Up Instructions:    I discussed the assessment and treatment plan with the patient. The patient was provided an opportunity to ask questions and all were answered. The patient agreed with the plan and demonstrated an understanding of the instructions.   The patient was advised to call back or seek an in-person evaluation if the symptoms worsen or if the condition fails to improve as anticipated.  I provided 15 minutes of non-face-to-face time during this encounter.   Ann Held, DO

## 2019-03-19 IMAGING — DX DG CHEST 2V
2 series · 2 of 2 positions shown · non-contrast
Comparison: Chest CT dated 11/02/2017. chest x-ray dated
05/30/2017.

CLINICAL DATA: Follow-up pneumonia.

EXAM:
CHEST - 2 VIEW

[chest pa]
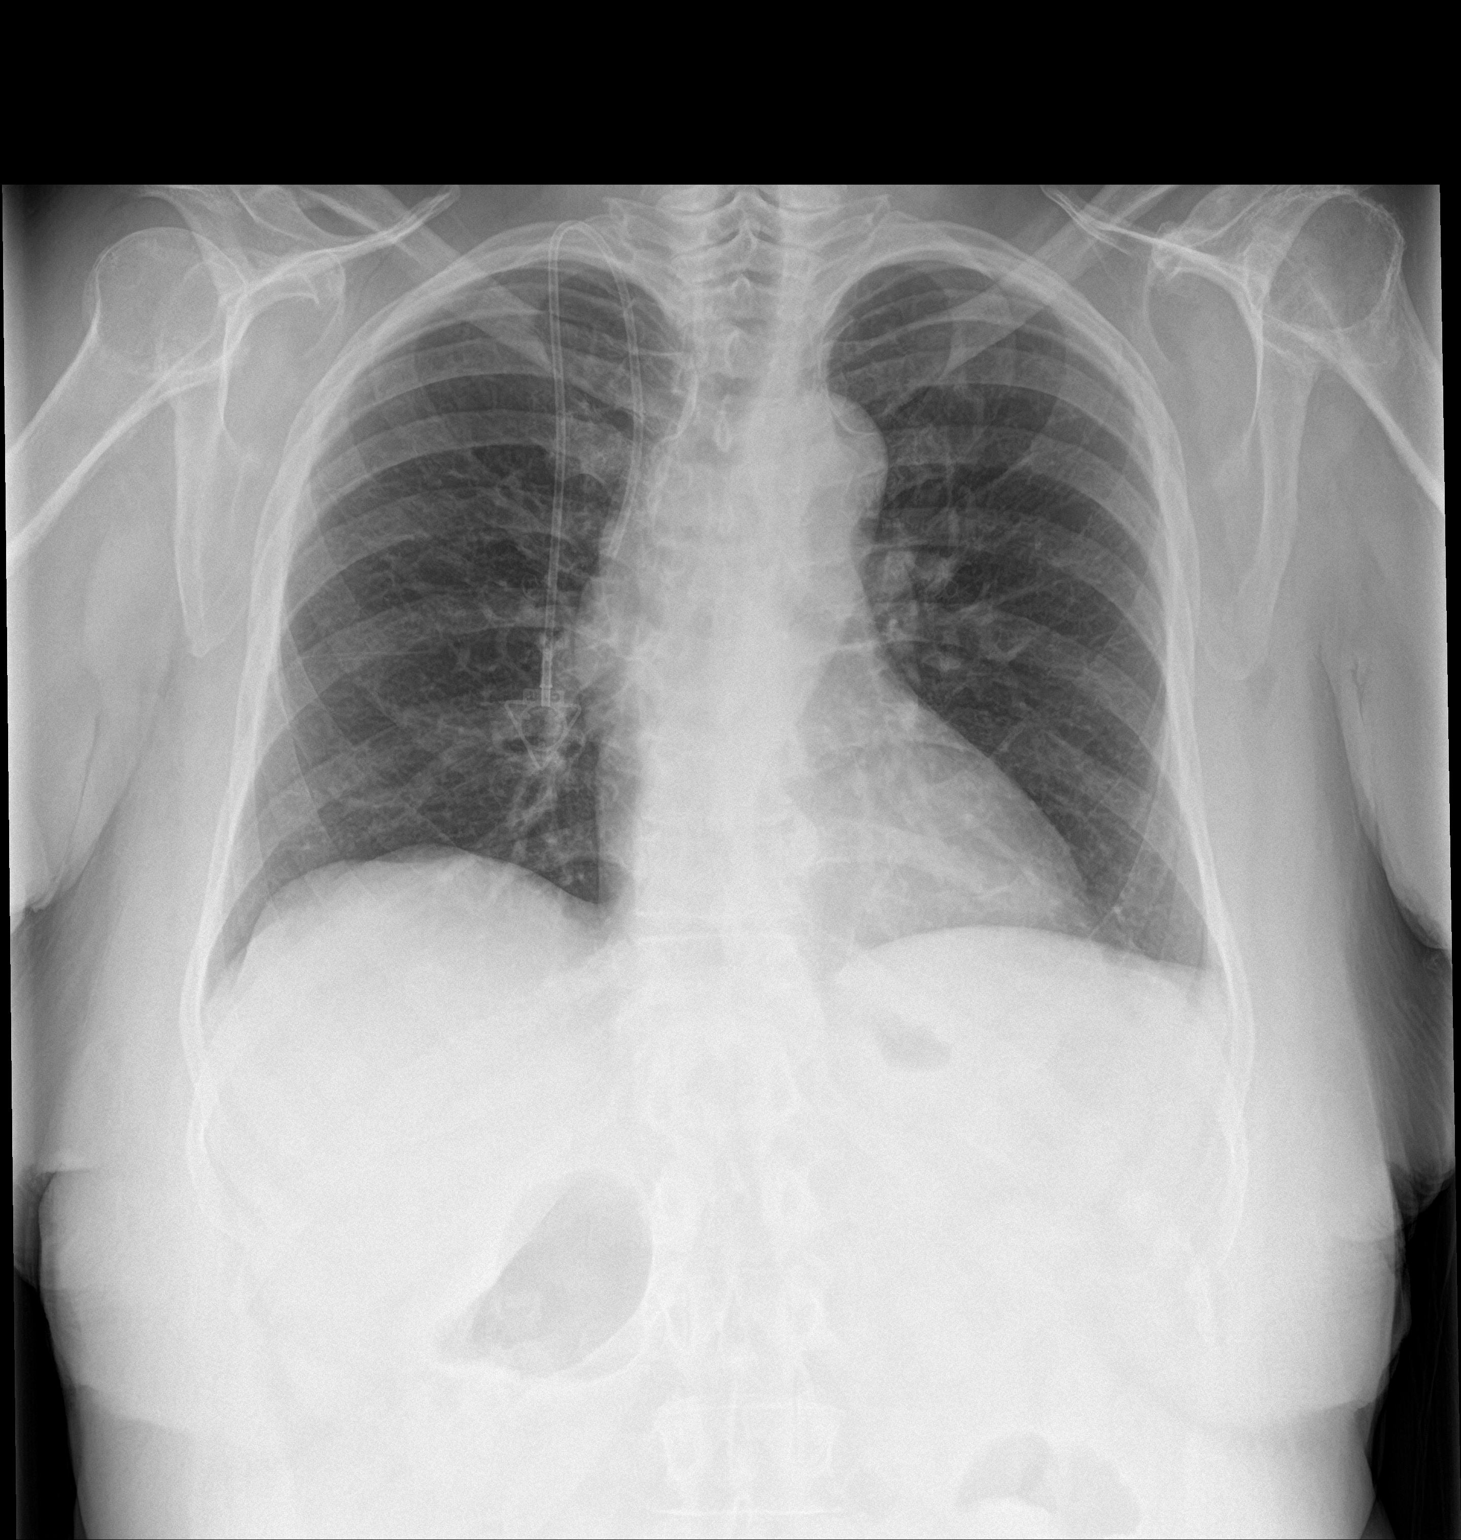

[chest lat]
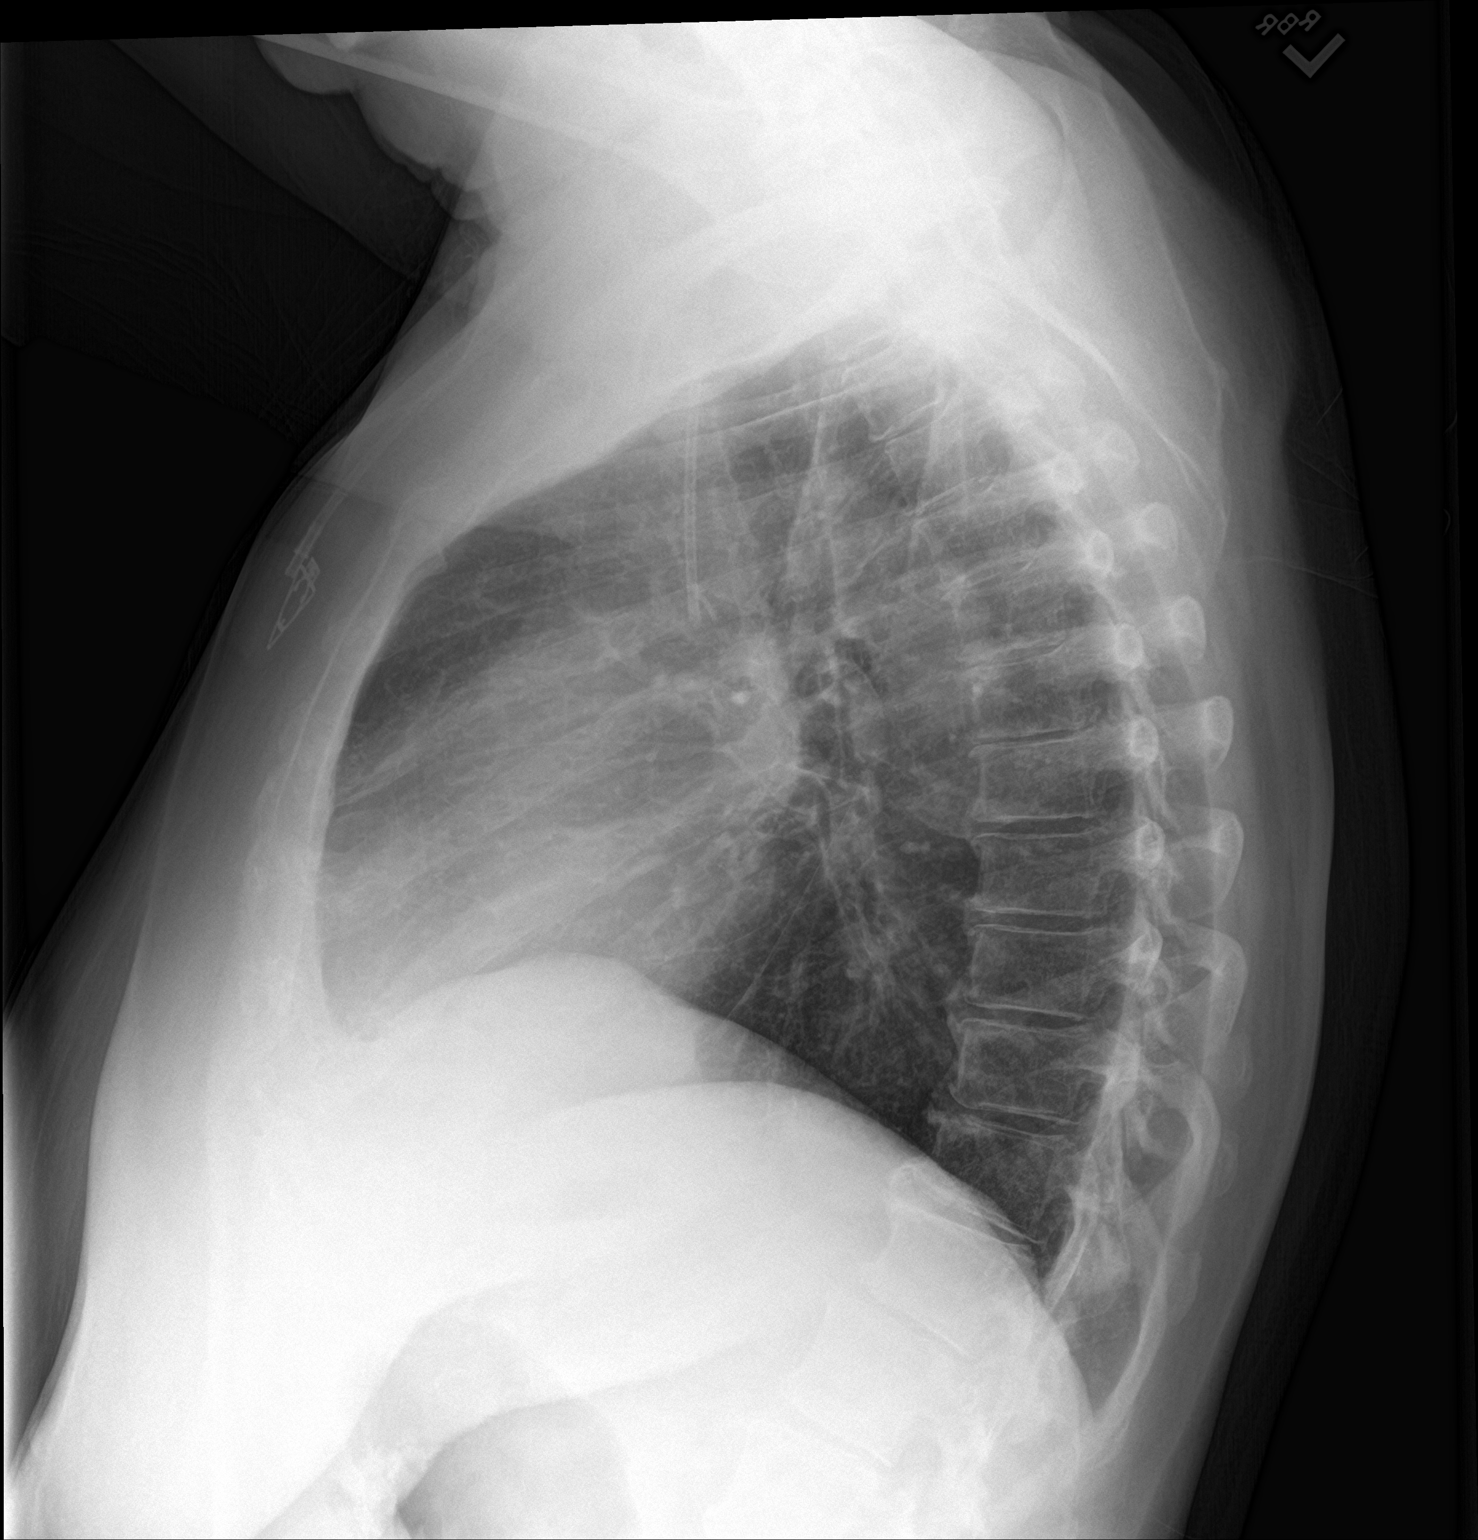

[2 of 2 positions shown; findings below may reference images not displayed]

FINDINGS: Lungs now appear clear. No evidence of pneumonia on today's exam. No
pleural effusion or pneumothorax seen. Heart size and mediastinal
contours are within normal limits. RIGHT chest wall Port-A-Cath
appears stable in position with tip at the level of the mid/upper
SVC. No acute or suspicious osseous finding.
IMPRESSION: No active cardiopulmonary disease. No evidence of pneumonia on
today's exam.

## 2019-03-21 ENCOUNTER — Telehealth: Payer: Self-pay

## 2019-03-21 ENCOUNTER — Ambulatory Visit: Payer: Self-pay

## 2019-03-21 ENCOUNTER — Telehealth: Payer: Self-pay | Admitting: *Deleted

## 2019-03-21 NOTE — Telephone Encounter (Signed)
Call placed back to patient to inform her to take Zofran as ordered for nausea and imodium for diarrhea.  Pt states that her PCP did call her back and told her to go to an Urgent Care if she worsens.  Pt states that she does not feel as though she needs to go to an Urgent Care at this time.  Informed pt that I would call her back tomorrow to check on her.  Pt appreciative of call back.

## 2019-03-21 NOTE — Telephone Encounter (Signed)
Patient called stating she has had diarrhea and has been throwing up over the night, patient requested for nurse to call back. Telephone call to Patient reports that she 5 diarrhea stool since last night. Vomiting 3 times today. Onset was last night.   Consistency is watery. Denies  Abdominal pain dose report nausea. Denies traveling to foreign  Country.  Has been sipping on 7-up. Ginger ale .  Denies previous use of antibiotics,  Thinks she had a fever last nite due to the fact she was sweating .  Encourage pt. To go to Urgent care if Sx. Worsen over night.               Reason for Disposition . [1] MILD diarrhea (e.g., 1-3 or more stools than normal in past 24 hours) without known cause AND [2] present >  7 days  Answer Assessment - Initial Assessment Questions 1. DIARRHEA SEVERITY: "How bad is the diarrhea?" "How many extra stools have you had in the past 24 hours than normal?"    - NO DIARRHEA (SCALE 0)   - MILD (SCALE 1-3): Few loose or mushy BMs; increase of 1-3 stools over normal daily number of stools; mild increase in ostomy output.   -  MODERATE (SCALE 4-7): Increase of 4-6 stools daily over normal; moderate increase in ostomy output. * SEVERE (SCALE 8-10; OR 'WORST POSSIBLE'): Increase of 7 or more stools daily over normal; moderate increase in ostomy output; incontinence.     moderate 2. ONSET: "When did the diarrhea begin?"      Last nite 3. BM CONSISTENCY: "How loose or watery is the diarrhea?"      yes 4. VOMITING: "Are you also vomiting?" If so, ask: "How many times in the past 24 hours?"      3 since last night 5. ABDOMINAL PAIN: "Are you having any abdominal pain?" If yes: "What does it feel like?" (e.g., crampy, dull, intermittent, constant)      Denies nausea 6. ABDOMINAL PAIN SEVERITY: If present, ask: "How bad is the pain?"  (e.g., Scale 1-10; mild, moderate, or severe)   - MILD (1-3): doesn't interfere with normal activities, abdomen soft and not tender to touch     - MODERATE (4-7): interferes with normal activities or awakens from sleep, tender to touch    - SEVERE (8-10): excruciating pain, doubled over, unable to do any normal activiti     nausea 7. ORAL INTAKE: If vomiting, "Have you been able to drink liquids?" "How much fluids have you had in the past 24 hours?"     Drank 7 up and  8. HYDRATION: "Any signs of dehydration?" (e.g., dry mouth [not just dry lips], too weak to stand, dizziness, new weight loss) "When did you last urinate?"     Urinating , dry mouth 9. EXPOSURE: "Have you traveled to a foreign country recently?" "Have you been exposed to anyone with diarrhea?" "Could you have eaten any food that was spoiled?"      10. ANTIBIOTIC USE: "Are you taking antibiotics now or have you taken antibiotics in the past 2 months?"       denies 11. OTHER SYMPTOMS: "Do you have any other symptoms?" (e.g., fever, blood in stool)       Thinks so last night 12. PREGNANCY: "Is there any chance you are pregnant?" "When was your last menstrual period?"       na  Protocols used: [facility]

## 2019-03-21 NOTE — Telephone Encounter (Signed)
Pt needs virtual visit as soon as possible

## 2019-03-21 NOTE — Telephone Encounter (Signed)
Copied from [id number]. Topic: General - Inquiry >> Mar 21, 2019 11:56 AM Mathis Bud wrote: Reason for CRM: Patient called stating she has had diarrhea and has been throwing up over the night,  patient requested for PCP nurse to call back. [phone]

## 2019-03-21 NOTE — Telephone Encounter (Signed)
Call received from patient stating that she is having vomiting and diarrhea that started last night, along with "sweats" during the night last night and would like to cancel her appts tomorrow with Dr. Marin Olp.  She states that she has placed a call to her PCP and is awaiting a call back from them.  Dr. Marin Olp notified.  Informed pt to call office back when she is feeling better to reschedule appts.

## 2019-03-22 ENCOUNTER — Inpatient Hospital Stay: Payer: Medicare HMO

## 2019-03-22 ENCOUNTER — Emergency Department (HOSPITAL_BASED_OUTPATIENT_CLINIC_OR_DEPARTMENT_OTHER): Payer: Medicare HMO

## 2019-03-22 ENCOUNTER — Telehealth: Payer: Self-pay | Admitting: *Deleted

## 2019-03-22 ENCOUNTER — Encounter (HOSPITAL_BASED_OUTPATIENT_CLINIC_OR_DEPARTMENT_OTHER): Payer: Self-pay

## 2019-03-22 ENCOUNTER — Other Ambulatory Visit: Payer: Self-pay

## 2019-03-22 ENCOUNTER — Ambulatory Visit: Payer: Medicare HMO | Admitting: Hematology & Oncology

## 2019-03-22 ENCOUNTER — Inpatient Hospital Stay (HOSPITAL_BASED_OUTPATIENT_CLINIC_OR_DEPARTMENT_OTHER)
Admission: EM | Admit: 2019-03-22 | Discharge: 2019-03-24 | DRG: 392 | Disposition: A | Discharge status: Home / Self Care | Payer: Medicare HMO | Attending: Internal Medicine | Admitting: Internal Medicine

## 2019-03-22 DIAGNOSIS — N183 Chronic kidney disease, stage 3 unspecified: Secondary | ICD-10-CM | POA: Diagnosis present

## 2019-03-22 DIAGNOSIS — E1142 Type 2 diabetes mellitus with diabetic polyneuropathy: Secondary | ICD-10-CM | POA: Diagnosis not present

## 2019-03-22 DIAGNOSIS — Z6833 Body mass index (BMI) 33.0-33.9, adult: Secondary | ICD-10-CM

## 2019-03-22 DIAGNOSIS — Z20828 Contact with and (suspected) exposure to other viral communicable diseases: Secondary | ICD-10-CM | POA: Diagnosis not present

## 2019-03-22 DIAGNOSIS — C169 Malignant neoplasm of stomach, unspecified: Secondary | ICD-10-CM | POA: Diagnosis present

## 2019-03-22 DIAGNOSIS — N179 Acute kidney failure, unspecified: Secondary | ICD-10-CM | POA: Diagnosis present

## 2019-03-22 DIAGNOSIS — Z794 Long term (current) use of insulin: Secondary | ICD-10-CM

## 2019-03-22 DIAGNOSIS — Z9049 Acquired absence of other specified parts of digestive tract: Secondary | ICD-10-CM

## 2019-03-22 DIAGNOSIS — K529 Noninfective gastroenteritis and colitis, unspecified: Principal | ICD-10-CM | POA: Diagnosis present

## 2019-03-22 DIAGNOSIS — R197 Diarrhea, unspecified: Secondary | ICD-10-CM | POA: Diagnosis not present

## 2019-03-22 DIAGNOSIS — E785 Hyperlipidemia, unspecified: Secondary | ICD-10-CM | POA: Diagnosis present

## 2019-03-22 DIAGNOSIS — D573 Sickle-cell trait: Secondary | ICD-10-CM | POA: Diagnosis present

## 2019-03-22 DIAGNOSIS — K30 Functional dyspepsia: Secondary | ICD-10-CM | POA: Diagnosis present

## 2019-03-22 DIAGNOSIS — E1122 Type 2 diabetes mellitus with diabetic chronic kidney disease: Secondary | ICD-10-CM | POA: Diagnosis present

## 2019-03-22 DIAGNOSIS — G40301 Generalized idiopathic epilepsy and epileptic syndromes, not intractable, with status epilepticus: Secondary | ICD-10-CM | POA: Diagnosis present

## 2019-03-22 DIAGNOSIS — Z903 Acquired absence of stomach [part of]: Secondary | ICD-10-CM

## 2019-03-22 DIAGNOSIS — G4733 Obstructive sleep apnea (adult) (pediatric): Secondary | ICD-10-CM | POA: Diagnosis not present

## 2019-03-22 DIAGNOSIS — J45909 Unspecified asthma, uncomplicated: Secondary | ICD-10-CM | POA: Diagnosis not present

## 2019-03-22 DIAGNOSIS — E869 Volume depletion, unspecified: Secondary | ICD-10-CM | POA: Diagnosis not present

## 2019-03-22 DIAGNOSIS — I959 Hypotension, unspecified: Secondary | ICD-10-CM | POA: Diagnosis not present

## 2019-03-22 DIAGNOSIS — Z79899 Other long term (current) drug therapy: Secondary | ICD-10-CM

## 2019-03-22 DIAGNOSIS — Z7982 Long term (current) use of aspirin: Secondary | ICD-10-CM

## 2019-03-22 DIAGNOSIS — Z7951 Long term (current) use of inhaled steroids: Secondary | ICD-10-CM

## 2019-03-22 DIAGNOSIS — Z809 Family history of malignant neoplasm, unspecified: Secondary | ICD-10-CM

## 2019-03-22 DIAGNOSIS — I2510 Atherosclerotic heart disease of native coronary artery without angina pectoris: Secondary | ICD-10-CM | POA: Diagnosis not present

## 2019-03-22 DIAGNOSIS — K5730 Diverticulosis of large intestine without perforation or abscess without bleeding: Secondary | ICD-10-CM | POA: Diagnosis present

## 2019-03-22 DIAGNOSIS — K219 Gastro-esophageal reflux disease without esophagitis: Secondary | ICD-10-CM | POA: Diagnosis present

## 2019-03-22 DIAGNOSIS — Z888 Allergy status to other drugs, medicaments and biological substances status: Secondary | ICD-10-CM

## 2019-03-22 DIAGNOSIS — R112 Nausea with vomiting, unspecified: Secondary | ICD-10-CM | POA: Diagnosis not present

## 2019-03-22 DIAGNOSIS — Z96641 Presence of right artificial hip joint: Secondary | ICD-10-CM | POA: Diagnosis present

## 2019-03-22 DIAGNOSIS — K9289 Other specified diseases of the digestive system: Secondary | ICD-10-CM | POA: Diagnosis present

## 2019-03-22 DIAGNOSIS — I129 Hypertensive chronic kidney disease with stage 1 through stage 4 chronic kidney disease, or unspecified chronic kidney disease: Secondary | ICD-10-CM | POA: Diagnosis present

## 2019-03-22 DIAGNOSIS — I252 Old myocardial infarction: Secondary | ICD-10-CM | POA: Diagnosis not present

## 2019-03-22 DIAGNOSIS — R1110 Vomiting, unspecified: Secondary | ICD-10-CM | POA: Diagnosis not present

## 2019-03-22 DIAGNOSIS — Z96652 Presence of left artificial knee joint: Secondary | ICD-10-CM | POA: Diagnosis not present

## 2019-03-22 DIAGNOSIS — Z833 Family history of diabetes mellitus: Secondary | ICD-10-CM

## 2019-03-22 DIAGNOSIS — Z7902 Long term (current) use of antithrombotics/antiplatelets: Secondary | ICD-10-CM

## 2019-03-22 DIAGNOSIS — Z8673 Personal history of transient ischemic attack (TIA), and cerebral infarction without residual deficits: Secondary | ICD-10-CM

## 2019-03-22 DIAGNOSIS — R000 Tachycardia, unspecified: Secondary | ICD-10-CM | POA: Diagnosis not present

## 2019-03-22 DIAGNOSIS — Z87891 Personal history of nicotine dependence: Secondary | ICD-10-CM

## 2019-03-22 DIAGNOSIS — Z885 Allergy status to narcotic agent status: Secondary | ICD-10-CM

## 2019-03-22 DIAGNOSIS — Z85028 Personal history of other malignant neoplasm of stomach: Secondary | ICD-10-CM

## 2019-03-22 DIAGNOSIS — Z880 Allergy status to penicillin: Secondary | ICD-10-CM

## 2019-03-22 DIAGNOSIS — Z9109 Other allergy status, other than to drugs and biological substances: Secondary | ICD-10-CM

## 2019-03-22 LAB — URINALYSIS, ROUTINE W REFLEX MICROSCOPIC
Bilirubin Urine: NEGATIVE
Glucose, UA: NEGATIVE mg/dL
Hgb urine dipstick: NEGATIVE
Ketones, ur: NEGATIVE mg/dL
Nitrite: NEGATIVE
Protein, ur: NEGATIVE mg/dL
Specific Gravity, Urine: 1.015 (ref 1.005–1.030)
pH: 7.5 (ref 5.0–8.0)

## 2019-03-22 LAB — CBC WITH DIFFERENTIAL/PLATELET
Abs Immature Granulocytes: 0.02 10*3/uL (ref 0.00–0.07)
Basophils Absolute: 0 10*3/uL (ref 0.0–0.1)
Basophils Relative: 0 %
Eosinophils Absolute: 0.1 10*3/uL (ref 0.0–0.5)
Eosinophils Relative: 2 %
HCT: 40.6 % (ref 36.0–46.0)
Hemoglobin: 13.4 g/dL (ref 12.0–15.0)
Immature Granulocytes: 0 %
Lymphocytes Relative: 33 %
Lymphs Abs: 2.5 10*3/uL (ref 0.7–4.0)
MCH: 28 pg (ref 26.0–34.0)
MCHC: 33 g/dL (ref 30.0–36.0)
MCV: 84.8 fL (ref 80.0–100.0)
Monocytes Absolute: 0.5 10*3/uL (ref 0.1–1.0)
Monocytes Relative: 7 %
Neutro Abs: 4.3 10*3/uL (ref 1.7–7.7)
Neutrophils Relative %: 58 %
Platelets: 372 10*3/uL (ref 150–400)
RBC: 4.79 MIL/uL (ref 3.87–5.11)
RDW: 17.6 % — ABNORMAL HIGH (ref 11.5–15.5)
WBC: 7.5 10*3/uL (ref 4.0–10.5)
nRBC: 0 % (ref 0.0–0.2)

## 2019-03-22 LAB — COMPREHENSIVE METABOLIC PANEL
ALT: 71 U/L — ABNORMAL HIGH (ref 0–44)
AST: 126 U/L — ABNORMAL HIGH (ref 15–41)
Albumin: 2.7 g/dL — ABNORMAL LOW (ref 3.5–5.0)
Alkaline Phosphatase: 107 U/L (ref 38–126)
Anion gap: 12 (ref 5–15)
BUN: 9 mg/dL (ref 8–23)
CO2: 24 mmol/L (ref 22–32)
Calcium: 9 mg/dL (ref 8.9–10.3)
Chloride: 108 mmol/L (ref 98–111)
Creatinine, Ser: 1.34 mg/dL — ABNORMAL HIGH (ref 0.44–1.00)
GFR calc Af Amer: 48 mL/min — ABNORMAL LOW (ref >60)
GFR calc non Af Amer: 41 mL/min — ABNORMAL LOW (ref >60)
Glucose, Bld: 197 mg/dL — ABNORMAL HIGH (ref 70–99)
Potassium: 4.6 mmol/L (ref 3.5–5.1)
Sodium: 144 mmol/L (ref 135–145)
Total Bilirubin: 0.6 mg/dL (ref 0.3–1.2)
Total Protein: 6.8 g/dL (ref 6.5–8.1)

## 2019-03-22 LAB — LACTIC ACID, PLASMA
Lactic Acid, Venous: 2.1 mmol/L (ref 0.5–1.9)
Lactic Acid, Venous: 1.2 mmol/L (ref 0.5–1.9)

## 2019-03-22 LAB — PROTIME-INR
INR: 1.1 (ref 0.8–1.2)
Prothrombin Time: 14.4 seconds (ref 11.4–15.2)

## 2019-03-22 LAB — URINALYSIS, MICROSCOPIC (REFLEX)

## 2019-03-22 LAB — SARS CORONAVIRUS 2 BY RT PCR (HOSPITAL ORDER, PERFORMED IN ~~LOC~~ HOSPITAL LAB): SARS Coronavirus 2: NEGATIVE

## 2019-03-22 LAB — APTT: aPTT: 25 seconds (ref 24–36)

## 2019-03-22 LAB — GLUCOSE, CAPILLARY: Glucose-Capillary: 218 mg/dL — ABNORMAL HIGH (ref 70–99)

## 2019-03-22 MED ORDER — FENTANYL CITRATE (PF) 100 MCG/2ML IJ SOLN
50.0000 ug | Freq: Once | INTRAMUSCULAR | Status: AC
  Administered 2019-03-22: 14:00:00 50 ug via INTRAVENOUS
  Filled 2019-03-22: qty 2

## 2019-03-22 MED ORDER — IOHEXOL 300 MG/ML  SOLN
100.0000 mL | Freq: Once | INTRAMUSCULAR | Status: AC | PRN
  Administered 2019-03-22: 100 mL via INTRAVENOUS

## 2019-03-22 MED ORDER — SODIUM CHLORIDE 0.9 % IV SOLN
1.0000 g | Freq: Once | INTRAVENOUS | Status: AC
  Administered 2019-03-22: 14:00:00 1 g via INTRAVENOUS
  Filled 2019-03-22: qty 1

## 2019-03-22 MED ORDER — ONDANSETRON HCL 4 MG/2ML IJ SOLN
4.0000 mg | Freq: Once | INTRAMUSCULAR | Status: AC
  Administered 2019-03-22: 4 mg via INTRAVENOUS
  Filled 2019-03-22: qty 2

## 2019-03-22 MED ORDER — SODIUM CHLORIDE 0.9 % IV BOLUS
1000.0000 mL | Freq: Once | INTRAVENOUS | Status: AC
  Administered 2019-03-22: 1000 mL via INTRAVENOUS

## 2019-03-22 MED ORDER — ONDANSETRON HCL 4 MG PO TABS: 4.0000 mg | ORAL_TABLET | Freq: Four times a day (QID) | ORAL | Status: DC | PRN

## 2019-03-22 MED ORDER — SODIUM CHLORIDE 0.9 % IV BOLUS
2000.0000 mL | Freq: Once | INTRAVENOUS | Status: AC
  Administered 2019-03-22: 2000 mL via INTRAVENOUS

## 2019-03-22 MED ORDER — SODIUM CHLORIDE 0.9 % IV SOLN
INTRAVENOUS | Status: DC
  Administered 2019-03-22 – 2019-03-24 (×3): via INTRAVENOUS

## 2019-03-22 MED ORDER — ONDANSETRON HCL 4 MG/2ML IJ SOLN: 4.0000 mg | Freq: Four times a day (QID) | INTRAMUSCULAR | Status: DC | PRN

## 2019-03-22 NOTE — Telephone Encounter (Signed)
Call received from patient stating that her diarrhea has stopped, but that she continues with weakness, nausea, vomiting and intermittent diaphoresis.  Pt instructed to go to the ED now.  Pt states that she does not have a ride d/t she does not want to expose her daughter to anything and that she does not want to call an ambulance d/t increased cost.  Instructed pt that I would call her daughter for her to arrange transportation.

## 2019-03-22 NOTE — ED Provider Notes (Signed)
[facility] EMERGENCY DEPARTMENT Provider Note   CSN: [id number] Arrival date & time: 03/22/19  1300     History   Chief Complaint Chief Complaint  Patient presents with   Diarrhea    HPI [name] is a 68 y.o. female.     The history is provided by the patient and medical records. No language interpreter was used.  Diarrhea  [name] is a 68 y.o. female who presents to the Emergency Department complaining of vomiting, diarrhea and abdominal pain. She presents to the emergency department complaining of significant vomiting, diarrhea and lower abdominal pain that began yesterday. She reports up to five stools that she describes as red in nature. She states that she just ate tomatoes and watermelon and thinks that might have made her stool red. She also reports three episodes of emesis that was clear to yellow in color. She had a subjective fever last night with soaking of her sheets. She has left lower quadrant abdominal pain. No dysuria. Past Medical History:  Diagnosis Date   Asthma    Degenerative joint disease    Diabetes mellitus    Dyspnea    GERD (gastroesophageal reflux disease)    Hyperlipidemia    no longer on medication for this   Hypertension    patient denies   Myocardial infarction ([place]) 08/2018   Neuropathy    bilateral hands and feet   Sickle cell trait ([place])    Sleep apnea    stomach ca dx'd 2010   chemo/xrt comp 05/2009   TIA (transient ischemic attack) 07/2015    Patient Active Problem List   Diagnosis Date Noted   Hematoma of scalp 03/14/2019   Vitamin D deficiency 11/09/2018   Muscle spasm 10/19/2018   Chest pain 09/07/2018   Palpitations 09/07/2018   Other fatigue 09/07/2018   Acute ST elevation myocardial infarction (STEMI) of inferior wall ([place]) 08/27/2018    Class: Hospitalized for   Coronary artery disease involving native coronary artery of native heart with unstable angina pectoris ([place])  08/27/2018    Class: Diagnosis of   Acute vaginitis 08/20/2018   Hyperlipidemia associated with type 2 diabetes mellitus ([place]) 04/09/2018   Bronchopneumonia 11/02/2017   Sepsis ([place]) 11/02/2017   Leucocytosis 11/02/2017   Moderate persistent asthma with acute exacerbation 05/26/2017   Influenza A 05/26/2017   Acute pain of right shoulder 03/07/2017   Acute bilateral low back pain without sciatica 03/07/2017   Chronic pain 03/07/2017   Fatty liver 03/24/2016   Weakness 03/23/2016   Unexplained weight loss 03/23/2016   Melena 03/23/2016   Hypotension 02/03/2016   Syncope 02/03/2016   Hypomagnesemia 02/03/2016   AKI (acute kidney injury) ([place]) 02/03/2016   Gastrointestinal dysmotility 01/20/2016   CKD (chronic kidney disease) stage 3, GFR 30-59 ml/min (HCC) 01/20/2016   Abscess of right thigh 01/20/2016   Chronic pain syndrome 01/15/2016   Diarrhea 01/15/2016   Generalized abdominal pain 01/14/2016   Type 2 diabetes with nephropathy ([place]) 12/30/2015   Morbid obesity due to excess calories ([place]) 05/05/2015   Diabetic polyneuropathy associated with type 2 diabetes mellitus ([place]) 01/02/2015   Acute bacterial sinusitis 01/02/2015   Onychomycosis 11/04/2014   Pain in lower limb 11/04/2014   Multinodular goiter (nontoxic) 10/16/2014   History of CVA (cerebrovascular accident) 08/11/2014   Iron deficiency anemia 05/20/2014   Major depressive disorder, single episode, moderate ([place]) 05/20/2014   Dizziness of unknown cause 04/28/2014   MDD (major depressive disorder), recurrent episode, moderate (  [place]) 01/28/2014   GERD (gastroesophageal reflux disease) 01/02/2014   Nausea 10/15/2012   Stomach cancer ([place]) 12/10/2008   CHRONIC RESPIRATORY FAILURE 01/03/2008   Hyperlipidemia 06/27/2007   OBESITY, MORBID 06/27/2007   SLEEP APNEA, OBSTRUCTIVE 06/27/2007   Essential hypertension 06/27/2007   Asthma 06/27/2007   OSTEOARTHRITIS  Advanced  S/P  L TKR and R THR 06/27/2007    Past Surgical History:  Procedure Laterality Date   APPENDECTOMY     CESAREAN SECTION     COLON SURGERY     colonscopy   COLONOSCOPY WITH PROPOFOL N/A 06/01/2016   Procedure: COLONOSCOPY WITH PROPOFOL;  Surgeon: Wilford Corner, MD;  Location: [facility] ENDOSCOPY;  Service: Endoscopy;  Laterality: N/A;   CORONARY STENT INTERVENTION N/A 08/29/2018   Procedure: CORONARY STENT INTERVENTION;  Surgeon: Wellington Hampshire, MD;  Location: [facility] CV LAB;  Service: Cardiovascular;  Laterality: N/A;   CORONARY/GRAFT ACUTE MI REVASCULARIZATION N/A 08/27/2018   Procedure: Coronary/Graft Acute MI Revascularization;  Surgeon: Leonie Man, MD;  Location: [facility] LAB;  Service: Cardiovascular;  Laterality: N/A;   ESOPHAGOGASTRODUODENOSCOPY N/A 10/15/2012   Procedure: ESOPHAGOGASTRODUODENOSCOPY (EGD);  Surgeon: Lear Ng, MD;  Location: [facility] ENDOS[facility];  Service: Endoscopy;  Laterality: N/A;   ESOPHAGOGASTRODUODENOSCOPY N/A 01/15/2016   Procedure: ESOPHAGOGASTRODUODENOSCOPY (EGD);  Surgeon: Ronald Lobo, MD;  Location: [facility] ENDOSCOPY;  Service: Endoscopy;  Laterality: N/A;   HERNIA REPAIR     LEFT HEART CATH AND CORONARY ANGIOGRAPHY N/A 08/27/2018   Procedure: LEFT HEART CATH AND CORONARY ANGIOGRAPHY;  Surgeon: Leonie Man, MD;  Location: [facility] LAB;  Service: Cardiovascular;  Laterality: N/A;   SHOULDER SURGERY     Left   TOTAL HIP ARTHROPLASTY     Right   TOTAL KNEE ARTHROPLASTY     Left     OB History    Gravida  4   Para  4   Term      Preterm      AB      Living  4     SAB      TAB      Ectopic      Multiple      Live Births               Home Medications    Prior to Admission medications   Medication Sig Start Date End Date Taking? Authorizing Provider  albuterol (PROVENTIL HFA;VENTOLIN HFA) 108 (90 Base) MCG/ACT inhaler Inhale 2 puffs into the lungs every 6 (six) hours as needed for wheezing or  shortness of breath. 11/05/17   Roxan Hockey, MD  albuterol (PROVENTIL) (2.5 MG/3ML) 0.083% nebulizer solution Take 3 mLs (2.5 mg total) by nebulization every 6 (six) hours as needed for wheezing or shortness of breath. 11/05/17   Roxan Hockey, MD  aspirin EC 81 MG tablet Take 1 tablet (81 mg total) by mouth daily. 02/20/19   Ann Held, DO  cephALEXin (KEFLEX) 500 MG capsule Take 1 capsule (500 mg total) by mouth 2 (two) times daily. 02/12/19   Roma Schanz R, DO  clopidogrel (PLAVIX) 75 MG tablet TAKE 4 TABLETS BY MOUTH ON DAY 1 THEN, 1TAB DAILY 02/06/19   Lendon Colonel, NP  famotidine (PEPCID) 40 MG tablet Take 1 tablet (40 mg total) by mouth daily. 02/12/19   Roma Schanz R, DO  fluticasone (FLONASE) 50 MCG/ACT nasal spray Place 2 sprays into both nostrils daily. 04/18/18   Ann Held, DO  gabapentin (NEURONTIN) 800 MG tablet TAKE ONE TABLET BY MOUTH THREE TIMES DAILY ;; 03/06/19   Carollee Herter, Alferd Apa, DO  glimepiride (AMARYL) 4 MG tablet Take 1 tablet (4 mg total) by mouth 2 (two) times daily. 01/24/19   Ann Held, DO  glucose blood (ONETOUCH VERIO) test strip Use as instructed to check blood sugar 3 times a day 09/04/18   Philemon Kingdom, MD  Insulin Degludec (TRESIBA FLEXTOUCH) 200 UNIT/ML SOPN Inject 20 Units into the skin daily. 08/31/18   Philemon Kingdom, MD  Insulin Pen Needle (B-D UF III MINI PEN NEEDLES) 31G X 5 MM MISC Use to inject insulin once a day. 01/07/19   Philemon Kingdom, MD  ipratropium-albuterol (DUONEB) 0.5-2.5 (3) MG/3ML SOLN Take 3 mLs by nebulization every 6 (six) hours as needed. Patient taking differently: Take 3 mLs by nebulization every 6 (six) hours as needed (sob and wheezing).  05/26/17   Ann Held, DO  isosorbide mononitrate (IMDUR) 30 MG 24 hr tablet Take 0.5 tablets (15 mg total) by mouth daily. 01/14/19   Lorretta Harp, MD  lidocaine-prilocaine (EMLA) cream Apply 1 application topically as  needed. 02/28/19   Volanda Napoleon, MD  metFORMIN (GLUCOPHAGE) 500 MG tablet Take 1 tablet (500 mg total) by mouth 2 (two) times daily with a meal. 02/13/19   Philemon Kingdom, MD  methocarbamol (ROBAXIN) 500 MG tablet Take 1 tablet (500 mg total) by mouth every 6 (six) hours as needed for muscle spasms. 10/19/18   Ann Held, DO  metoprolol tartrate (LOPRESSOR) 25 MG tablet Take 1 tablet (25 mg total) by mouth 2 (two) times daily. 01/14/19   Lorretta Harp, MD  Multiple Vitamin (MULTIVITAMIN WITH MINERALS) TABS tablet Take 1 tablet by mouth daily. 09/13/18   Ann Held, DO  Multiple Vitamins-Minerals (CENTRUM SILVER ADULT 50+) TABS Take 1 tablet once a day 02/20/19   Carollee Herter, Alferd Apa, DO  nitroGLYCERIN (NITROSTAT) 0.4 MG SL tablet Place 1 tablet (0.4 mg total) under the tongue every 5 (five) minutes as needed. 01/14/19   Lorretta Harp, MD  ondansetron (ZOFRAN) 4 MG tablet TAKE ONE TABLET BY MOUTH EVERY EIGHT HOURS AS NEEDED FOR NAUSEA AND VOMITING 03/01/19   Volanda Napoleon, MD  pantoprazole (PROTONIX) 40 MG tablet Take 1 tablet (40 mg total) by mouth daily. 01/02/19   Roma Schanz R, DO  rosuvastatin (CRESTOR) 40 MG tablet TAKE ONE TABLET BY MOUTH EVERY DAY AT BEDTIME 03/01/19   Leonie Man, MD  sertraline (ZOLOFT) 50 MG tablet Take 1 tablet (50 mg total) by mouth daily. 02/20/19   Ann Held, DO  sucralfate (CARAFATE) 1 g tablet TAKE 1 TABLET BY MOUTH TWICE DAILY 02/05/19   [place], Holli Humbles, NP  traMADol (ULTRAM) 50 MG tablet TAKE 2 TABLETS BY MOUTH 3 TIMES DAILY AS NEEDED FOR MODERATE PAIN 02/18/19   Ann Held, DO    Family History Family History  Problem Relation Age of Onset   Diabetes Brother    Alzheimer's disease Father 60   Cancer Mother 58   Diabetes Maternal Grandmother    Cancer Maternal Grandfather        lung   Suicidality Neg Hx    Depression Neg Hx    Dementia Neg Hx    Anxiety disorder Neg Hx      Social History Social History   Tobacco Use   Smoking status: Former Smoker  Packs/day: 1.00    Years: 15.00    Pack years: 15.00    Types: Cigarettes    Start date: 09/27/1973    Quit date: 10/15/1988    Years since quitting: 30.4   Smokeless tobacco: Never Used   Tobacco comment: quit smoking 14 years ago  Substance Use Topics   Alcohol use: No    Alcohol/week: 0.0 standard drinks   Drug use: No     Allergies   Adhesive [tape], Codeine, Zocor [simvastatin - high dose], and Penicillins   Review of Systems Review of Systems  Gastrointestinal: Positive for diarrhea.  All other systems reviewed and are negative.    Physical Exam Updated Vital Signs BP (!) 152/89 (BP Location: Right Wrist)    Pulse 96    Temp 99.6 F (37.6 C) (Rectal)    Resp 18    SpO2 100%   Physical Exam Vitals signs and nursing note reviewed.  Constitutional:      Appearance: She is well-developed.  HENT:     Head: Normocephalic and atraumatic.  Cardiovascular:     Rate and Rhythm: Regular rhythm. Tachycardia present.  Pulmonary:     Effort: Pulmonary effort is normal. No respiratory distress.  Abdominal:     Palpations: Abdomen is soft.     Tenderness: There is no guarding or rebound.     Comments: Moderate llq tenderness, voluntary guarding  Musculoskeletal:        General: No tenderness.  Skin:    General: Skin is warm and dry.  Neurological:     Mental Status: She is alert and oriented to person, place, and time.  Psychiatric:        Behavior: Behavior normal.      ED Treatments / Results  Labs (all labs ordered are listed, but only abnormal results are displayed) Labs Reviewed  LACTIC ACID, PLASMA - Abnormal; Notable for the following components:      Result Value   Lactic Acid, Venous 2.1 (*)    All other components within normal limits  COMPREHENSIVE METABOLIC PANEL - Abnormal; Notable for the following components:   Glucose, Bld 197 (*)    Creatinine, Ser  1.34 (*)    Albumin 2.7 (*)    AST 126 (*)    ALT 71 (*)    GFR calc non Af Amer 41 (*)    GFR calc Af Amer 48 (*)    All other components within normal limits  CBC WITH DIFFERENTIAL/PLATELET - Abnormal; Notable for the following components:   RDW 17.6 (*)    All other components within normal limits  CULTURE, BLOOD (ROUTINE X 2)  CULTURE, BLOOD (ROUTINE X 2)  URINE CULTURE  SARS CORONAVIRUS 2 (HOSPITAL ORDER, [facility])  APTT  PROTIME-INR  LACTIC ACID, PLASMA  URINALYSIS, ROUTINE W REFLEX MICROSCOPIC    EKG EKG Interpretation  Date/Time:  Friday March 22 2019 13:21:45 EDT Ventricular Rate:  110 PR Interval:    QRS Duration: 72 QT Interval:  310 QTC Calculation: 420 R Axis:   -43 Text Interpretation:  Sinus tachycardia Left axis deviation Confirmed by Quintella Reichert [phone]) on 03/22/2019 1:27:29 PM   Radiology Ct Abdomen Pelvis W Contrast  Result Date: 03/22/2019 CLINICAL DATA:  Acute abdominal pain, nausea, vomiting. EXAM: CT ABDOMEN AND PELVIS WITH CONTRAST TECHNIQUE: Multidetector CT imaging of the abdomen and pelvis was performed using the standard protocol following bolus administration of intravenous contrast. CONTRAST:  167m OMNIPAQUE IOHEXOL 300 MG/ML  SOLN  COMPARISON:  CT scan of Dec 17, 2016. FINDINGS: Lower chest: No acute abnormality. Hepatobiliary: No focal liver abnormality is seen. No gallstones, gallbladder wall thickening, or biliary dilatation. Pancreas: Pancreatic atrophy is again noted. No acute abnormality or ductal dilatation is noted. Spleen: Normal in size without focal abnormality. Adrenals/Urinary Tract: Adrenal glands appear normal. Right kidney is unremarkable. Cortical thinning of upper pole of left kidney is noted with overall mild left renal atrophy. No hydronephrosis or renal obstruction is noted. No renal or ureteral calculi are noted. Bladder is unremarkable. Stomach/Bowel: Status post partial gastrectomy and  gastrojejunostomy. There is no evidence of bowel obstruction or ileus. Status post appendectomy. Sigmoid diverticulosis is noted without inflammation. Vascular/Lymphatic: Aortic atherosclerosis. No enlarged abdominal or pelvic lymph nodes. Reproductive: Uterus and bilateral adnexa are unremarkable. Other: No abdominal wall hernia or abnormality. No abdominopelvic ascites. Musculoskeletal: Status post right hip arthroplasty. No acute abnormality is noted. IMPRESSION: Status post partial gastrectomy and gastrojejunostomy. Sigmoid diverticulosis without inflammation. Stable left renal atrophy. No acute abnormality seen in the abdomen or pelvis. Aortic Atherosclerosis (ICD10-I70.0). Electronically Signed   By: Marijo Conception M.D.   On: 03/22/2019 14:53   Dg Chest Port 1 View  Result Date: 03/22/2019 CLINICAL DATA:  Vomiting, diarrhea and chills beginning yesterday. EXAM: PORTABLE CHEST 1 VIEW COMPARISON:  PA and lateral chest 09/07/2018. FINDINGS: Right IJ Port-A-Cath is unchanged. Lungs clear. Heart size normal. No pneumothorax or pleural fluid. No acute or bony abnormality. IMPRESSION: No acute disease. Electronically Signed   By: Inge Rise M.D.   On: 03/22/2019 13:51    Procedures Procedures (including critical care time) CRITICAL CARE Performed by: Quintella Reichert   Total critical care time: 35 minutes  Critical care time was exclusive of separately billable procedures and treating other patients.  Critical care was necessary to treat or prevent imminent or life-threatening deterioration.  Critical care was time spent personally by me on the following activities: development of treatment plan with patient and/or surrogate as well as nursing, discussions with consultants, evaluation of patient's response to treatment, examination of patient, obtaining history from patient or surrogate, ordering and performing treatments and interventions, ordering and review of laboratory studies, ordering  and review of radiographic studies, pulse oximetry and re-evaluation of patient's condition. Medications Ordered in ED Medications  sodium chloride 0.9 % bolus 2,000 mL (has no administration in time range)  sodium chloride 0.9 % bolus 1,000 mL (1,000 mLs Intravenous New Bag/Given 03/22/19 1353)  fentaNYL (SUBLIMAZE) injection 50 mcg (50 mcg Intravenous Given 03/22/19 1339)  ondansetron (ZOFRAN) injection 4 mg (4 mg Intravenous Given 03/22/19 1339)  meropenem (MERREM) 1 g in sodium chloride 0.9 % 100 mL IVPB (0 g Intravenous Stopped 03/22/19 1413)  iohexol (OMNIPAQUE) 300 MG/ML solution 100 mL (100 mLs Intravenous Contrast Given 03/22/19 1426)     Initial Impression / Assessment and Plan / ED Course  I have reviewed the triage vital signs and the nursing notes.  Pertinent labs & imaging results that were available during my care of the patient were reviewed by me and considered in my medical decision making (see chart for details).        Patient here for evaluation abdominal pain, vomiting and diarrhea. She is tachycardic and hypotensive on ED arrival with significant abdominal tenderness. She was treated with IV fluids, IV antibiotics for presumed sepsis, possible diverticulitis. Patient care transferred pending a urinalysis. Blood pressure significantly improved on reassessment after IV fluid administration. Final Clinical Impressions(s) / ED Diagnoses  Final diagnoses:  None    ED Discharge Orders    None       Quintella Reichert, MD 03/22/19 1526

## 2019-03-22 NOTE — ED Notes (Signed)
MD at bedside. 

## 2019-03-22 NOTE — Telephone Encounter (Signed)
Called pt. Last night pt spoke to [facility] nurse and also called oncology office. She is waiting for her daughter to come with zofran and imodium this morning. Pt stated she will call later if she feels virtual visit needed.

## 2019-03-22 NOTE — Telephone Encounter (Signed)
FYI

## 2019-03-22 NOTE — Telephone Encounter (Signed)
Reviewed in other note

## 2019-03-22 NOTE — ED Notes (Signed)
Patient transported to CT 

## 2019-03-22 NOTE — ED Triage Notes (Addendum)
Pt c/o n/v/d, sweats-sx started yesterday-pt entered triage in w/c-moaning-pale

## 2019-03-22 NOTE — H&P (Signed)
History and Physical   [name] HAL:[id number] DOB: 02-Jun-1951 DOA: 03/22/2019  Referring MD/NP/PA: Dr. Darl Householder  PCP: Carollee Herter, Alferd Apa, DO   Outpatient Specialists: Dr. Marin Olp  Patient coming from: [place]  Chief Complaint: Nausea vomiting diarrhea  HPI: [name] is a 68 y.o. female with medical history significant of stomach cancer being followed by Dr. Marin Olp, asthma, diabetes, GERD, hyperlipidemia, hypertension, obstructive sleep apnea, history of TIA, morbid obesity, and coronary artery disease who was seen at [facility] with 1 day of nausea vomiting and diarrhea.  Also abdominal pain.  Symptoms have been going on for over the day and she has had at least 5-6 stools a day.  Patient has been unable to keep anything down.  She has noted some right stools but had a lot of watermelon the day before.  Also a lot of raw tomatoes.  She was seen in the ER and treated symptomatically.  Patient did not stop diarrhea or vomiting at the time so she is being admitted to the hospital for treatment.  She denied any fever or chills.  Denied any known sick contacts including COVID-19.  Patient suspected to have acute gastroenteritis therefore and is being admitted to the hospital for treatment..  ED Course: Temperature is 99.6 blood pressure 74/61 pulse 124 respiratory of 22 and oxygen sat 98% room air.  Urinalysis showed cloudy urine with small leukocytes.  WBC 0-5 many squamous epithelial cells.  Lactic acid 1.2.  COVID-19 is negative.  Glucose 197 and creatinine 1.34 otherwise chemistry appears within normal.  AST 126 ALT 71.  CBC is normal.  Chest x-ray is normal.  CT abdomen pelvis shows status post partial gastrectomy and gastrojejunostomy.  Also sigmoid diverticulosis without inflammation with no acute findings.  Patient given IV fluids and Zofran is being admitted for treatment  Review of Systems: As per HPI otherwise 10 point review of systems negative.    Past  Medical History:  Diagnosis Date   Asthma    Degenerative joint disease    Diabetes mellitus    Dyspnea    GERD (gastroesophageal reflux disease)    Hyperlipidemia    no longer on medication for this   Hypertension    patient denies   Myocardial infarction ([place]) 08/2018   Neuropathy    bilateral hands and feet   Sickle cell trait ([place])    Sleep apnea    stomach ca dx'd 2010   chemo/xrt comp 05/2009   TIA (transient ischemic attack) 07/2015    Past Surgical History:  Procedure Laterality Date   APPENDECTOMY     CESAREAN SECTION     COLON SURGERY     colonscopy   COLONOSCOPY WITH PROPOFOL N/A 06/01/2016   Procedure: COLONOSCOPY WITH PROPOFOL;  Surgeon: Wilford Corner, MD;  Location: [facility] ENDOSCOPY;  Service: Endoscopy;  Laterality: N/A;   CORONARY STENT INTERVENTION N/A 08/29/2018   Procedure: CORONARY STENT INTERVENTION;  Surgeon: Wellington Hampshire, MD;  Location: [place] CV LAB;  Service: Cardiovascular;  Laterality: N/A;   CORONARY/GRAFT ACUTE MI REVASCULARIZATION N/A 08/27/2018   Procedure: Coronary/Graft Acute MI Revascularization;  Surgeon: Leonie Man, MD;  Location: [place] CV LAB;  Service: Cardiovascular;  Laterality: N/A;   ESOPHAGOGASTRODUODENOSCOPY N/A 10/15/2012   Procedure: ESOPHAGOGASTRODUODENOSCOPY (EGD);  Surgeon: Lear Ng, MD;  Location: [facility] ENDOSCOPY;  Service: Endoscopy;  Laterality: N/A;   ESOPHAGOGASTRODUODENOSCOPY N/A 01/15/2016   Procedure: ESOPHAGOGASTRODUODENOSCOPY (EGD);  Surgeon: Ronald Lobo, MD;  Location: [facility] ENDOSCOPY;  Service: Endoscopy;  Laterality: N/A;   HERNIA REPAIR     LEFT HEART CATH AND CORONARY ANGIOGRAPHY N/A 08/27/2018   Procedure: LEFT HEART CATH AND CORONARY ANGIOGRAPHY;  Surgeon: Leonie Man, MD;  Location: [place] CV LAB;  Service: Cardiovascular;  Laterality: N/A;   SHOULDER SURGERY     Left   TOTAL HIP ARTHROPLASTY     Right   TOTAL KNEE ARTHROPLASTY     Left     reports  that she quit smoking about 30 years ago. Her smoking use included cigarettes. She started smoking about 45 years ago. She has a 15.00 pack-year smoking history. She has never used smokeless tobacco. She reports that she does not drink alcohol or use drugs.  Allergies  Allergen Reactions   Adhesive [Tape] Other (See Comments)    Burn Skin   Codeine Other (See Comments)    paranoid   Zocor [Simvastatin - High Dose] Other (See Comments)    Muscle spasms   Penicillins Rash    Has patient had a PCN reaction causing immediate rash, facial/tongue/throat swelling, SOB or lightheadedness with hypotension: Yes Has patient had a PCN reaction causing severe rash involving mucus membranes or skin necrosis: No Has patient had a PCN reaction that required hospitalization does not remember.  Has patient had a PCN reaction occurring within the last 10 years: No If all of the above answers are "NO", then may proceed with Cephalosporin use.     Family History  Problem Relation Age of Onset   Diabetes Brother    Alzheimer's disease Father 59   Cancer Mother 55   Diabetes Maternal Grandmother    Cancer Maternal Grandfather        lung   Suicidality Neg Hx    Depression Neg Hx    Dementia Neg Hx    Anxiety disorder Neg Hx      Prior to Admission medications   Medication Sig Start Date End Date Taking? Authorizing Provider  albuterol (PROVENTIL HFA;VENTOLIN HFA) 108 (90 Base) MCG/ACT inhaler Inhale 2 puffs into the lungs every 6 (six) hours as needed for wheezing or shortness of breath. 11/05/17   Roxan Hockey, MD  albuterol (PROVENTIL) (2.5 MG/3ML) 0.083% nebulizer solution Take 3 mLs (2.5 mg total) by nebulization every 6 (six) hours as needed for wheezing or shortness of breath. 11/05/17   Roxan Hockey, MD  aspirin EC 81 MG tablet Take 1 tablet (81 mg total) by mouth daily. 02/20/19   Ann Held, DO  cephALEXin (KEFLEX) 500 MG capsule Take 1 capsule (500 mg total) by  mouth 2 (two) times daily. 02/12/19   Roma Schanz R, DO  clopidogrel (PLAVIX) 75 MG tablet TAKE 4 TABLETS BY MOUTH ON DAY 1 THEN, 1TAB DAILY 02/06/19   Lendon Colonel, NP  famotidine (PEPCID) 40 MG tablet Take 1 tablet (40 mg total) by mouth daily. 02/12/19   Roma Schanz R, DO  fluticasone (FLONASE) 50 MCG/ACT nasal spray Place 2 sprays into both nostrils daily. 04/18/18   Roma Schanz R, DO  gabapentin (NEURONTIN) 800 MG tablet TAKE ONE TABLET BY MOUTH THREE TIMES DAILY ;; 03/06/19   Carollee Herter, Alferd Apa, DO  glimepiride (AMARYL) 4 MG tablet Take 1 tablet (4 mg total) by mouth 2 (two) times daily. 01/24/19   Ann Held, DO  glucose blood (ONETOUCH VERIO) test strip Use as instructed to check blood sugar 3 times a day 09/04/18  Philemon Kingdom, MD  Insulin Degludec (TRESIBA FLEXTOUCH) 200 UNIT/ML SOPN Inject 20 Units into the skin daily. 08/31/18   Philemon Kingdom, MD  Insulin Pen Needle (B-D UF III MINI PEN NEEDLES) 31G X 5 MM MISC Use to inject insulin once a day. 01/07/19   Philemon Kingdom, MD  ipratropium-albuterol (DUONEB) 0.5-2.5 (3) MG/3ML SOLN Take 3 mLs by nebulization every 6 (six) hours as needed. Patient taking differently: Take 3 mLs by nebulization every 6 (six) hours as needed (sob and wheezing).  05/26/17   Ann Held, DO  isosorbide mononitrate (IMDUR) 30 MG 24 hr tablet Take 0.5 tablets (15 mg total) by mouth daily. 01/14/19   Lorretta Harp, MD  lidocaine-prilocaine (EMLA) cream Apply 1 application topically as needed. 02/28/19   Volanda Napoleon, MD  metFORMIN (GLUCOPHAGE) 500 MG tablet Take 1 tablet (500 mg total) by mouth 2 (two) times daily with a meal. 02/13/19   Philemon Kingdom, MD  methocarbamol (ROBAXIN) 500 MG tablet Take 1 tablet (500 mg total) by mouth every 6 (six) hours as needed for muscle spasms. 10/19/18   Ann Held, DO  metoprolol tartrate (LOPRESSOR) 25 MG tablet Take 1 tablet (25 mg total) by mouth 2 (two)  times daily. 01/14/19   Lorretta Harp, MD  Multiple Vitamin (MULTIVITAMIN WITH MINERALS) TABS tablet Take 1 tablet by mouth daily. 09/13/18   Ann Held, DO  Multiple Vitamins-Minerals (CENTRUM SILVER ADULT 50+) TABS Take 1 tablet once a day 02/20/19   Carollee Herter, Alferd Apa, DO  nitroGLYCERIN (NITROSTAT) 0.4 MG SL tablet Place 1 tablet (0.4 mg total) under the tongue every 5 (five) minutes as needed. 01/14/19   Lorretta Harp, MD  ondansetron (ZOFRAN) 4 MG tablet TAKE ONE TABLET BY MOUTH EVERY EIGHT HOURS AS NEEDED FOR NAUSEA AND VOMITING 03/01/19   Volanda Napoleon, MD  pantoprazole (PROTONIX) 40 MG tablet Take 1 tablet (40 mg total) by mouth daily. 01/02/19   Roma Schanz R, DO  rosuvastatin (CRESTOR) 40 MG tablet TAKE ONE TABLET BY MOUTH EVERY DAY AT BEDTIME 03/01/19   Leonie Man, MD  sertraline (ZOLOFT) 50 MG tablet Take 1 tablet (50 mg total) by mouth daily. 02/20/19   Ann Held, DO  sucralfate (CARAFATE) 1 g tablet TAKE 1 TABLET BY MOUTH TWICE DAILY 02/05/19   [place], Holli Humbles, NP  traMADol (ULTRAM) 50 MG tablet TAKE 2 TABLETS BY MOUTH 3 TIMES DAILY AS NEEDED FOR MODERATE PAIN 02/18/19   Ann Held, DO    Physical Exam: Vitals:   03/22/19 1727 03/22/19 1730 03/22/19 1800 03/22/19 2022  BP:  134/77 (!) 155/80 121/68  Pulse: (!) 59 60 64 70  Resp: 19 15 17 20   Temp:  98.5 F (36.9 C)    TempSrc:      SpO2: 100% 100% 98% 100%      Constitutional: NAD, calm, comfortable Vitals:   03/22/19 1727 03/22/19 1730 03/22/19 1800 03/22/19 2022  BP:  134/77 (!) 155/80 121/68  Pulse: (!) 59 60 64 70  Resp: 19 15 17 20   Temp:  98.5 F (36.9 C)    TempSrc:      SpO2: 100% 100% 98% 100%   Eyes: PERRL, lids and conjunctivae normal ENMT: Mucous membranes are moist. Posterior pharynx clear of any exudate or lesions.Normal dentition.  Neck: normal, supple, no masses, no thyromegaly Respiratory: clear to auscultation bilaterally, no wheezing, no  crackles. Normal respiratory effort. No  accessory muscle use.  Cardiovascular: Regular rate and rhythm, no murmurs / rubs / gallops. No extremity edema. 2+ pedal pulses. No carotid bruits.  Abdomen: Mild tenderness, no masses palpated. No hepatosplenomegaly. Bowel sounds positive.  Musculoskeletal: no clubbing / cyanosis. No joint deformity upper and lower extremities. Good ROM, no contractures. Normal muscle tone.  Skin: no rashes, lesions, ulcers. No induration Neurologic: CN 2-12 grossly intact. Sensation intact, DTR normal. Strength 5/5 in all 4.  Psychiatric: Normal judgment and insight. Alert and oriented x 3. Normal mood.     Labs on Admission: I have personally reviewed following labs and imaging studies  CBC: Recent Labs  Lab 03/22/19 1332  WBC 7.5  NEUTROABS 4.3  HGB 13.4  HCT 40.6  MCV 84.8  PLT 712   Basic Metabolic Panel: Recent Labs  Lab 03/22/19 1332  NA 144  K 4.6  CL 108  CO2 24  GLUCOSE 197*  BUN 9  CREATININE 1.34*  CALCIUM 9.0   GFR: CrCl cannot be calculated (Unknown ideal weight.). Liver Function Tests: Recent Labs  Lab 03/22/19 1332  AST 126*  ALT 71*  ALKPHOS 107  BILITOT 0.6  PROT 6.8  ALBUMIN 2.7*   No results for input(s): LIPASE, AMYLASE in the last 168 hours. No results for input(s): AMMONIA in the last 168 hours. Coagulation Profile: Recent Labs  Lab 03/22/19 1332  INR 1.1   Cardiac Enzymes: No results for input(s): CKTOTAL, CKMB, CKMBINDEX, TROPONINI in the last 168 hours. BNP (last 3 results) No results for input(s): PROBNP in the last 8760 hours. HbA1C: No results for input(s): HGBA1C in the last 72 hours. CBG: Recent Labs  Lab 03/22/19 2223  GLUCAP 218*   Lipid Profile: No results for input(s): CHOL, HDL, LDLCALC, TRIG, CHOLHDL, LDLDIRECT in the last 72 hours. Thyroid Function Tests: No results for input(s): TSH, T4TOTAL, FREET4, T3FREE, THYROIDAB in the last 72 hours. Anemia Panel: No results for input(s):  VITAMINB12, FOLATE, FERRITIN, TIBC, IRON, RETICCTPCT in the last 72 hours. Urine analysis:    Component Value Date/Time   COLORURINE YELLOW 03/22/2019 1615   APPEARANCEUR CLOUDY (A) 03/22/2019 1615   LABSPEC 1.015 03/22/2019 1615   LABSPEC 1.015 04/06/2015 0939   PHURINE 7.5 03/22/2019 1615   GLUCOSEU NEGATIVE 03/22/2019 1615   HGBUR NEGATIVE 03/22/2019 1615   BILIRUBINUR NEGATIVE 03/22/2019 1615   BILIRUBINUR 1+ 10/01/2018 1146   KETONESUR NEGATIVE 03/22/2019 1615   PROTEINUR NEGATIVE 03/22/2019 1615   UROBILINOGEN 0.2 10/01/2018 1146   UROBILINOGEN 0.2 04/06/2015 0939   NITRITE NEGATIVE 03/22/2019 1615   LEUKOCYTESUR SMALL (A) 03/22/2019 1615   Sepsis Labs: @LABRCNTIP (procalcitonin:4,lacticidven:4) ) Recent Results (from the past 240 hour(s))  SARS Coronavirus 2 [facility] order, Performed in [facility] hospital lab) Nasopharyngeal Nasopharyngeal Swab     Status: None   Collection Time: 03/22/19  3:17 PM   Specimen: Nasopharyngeal Swab  Result Value Ref Range Status   SARS Coronavirus 2 NEGATIVE NEGATIVE Final    Comment: (NOTE) If result is NEGATIVE SARS-CoV-2 target nucleic acids are NOT DETECTED. The SARS-CoV-2 RNA is generally detectable in upper and lower  respiratory specimens during the acute phase of infection. The lowest  concentration of SARS-CoV-2 viral copies this assay can detect is 250  copies / mL. A negative result does not preclude SARS-CoV-2 infection  and should not be used as the sole basis for treatment or other  patient management decisions.  A negative result may occur with  improper specimen collection / handling, submission of  specimen other  than nasopharyngeal swab, presence of viral mutation(s) within the  areas targeted by this assay, and inadequate number of viral copies  (<250 copies / mL). A negative result must be combined with clinical  observations, patient history, and epidemiological information. If result is POSITIVE SARS-CoV-2  target nucleic acids are DETECTED. The SARS-CoV-2 RNA is generally detectable in upper and lower  respiratory specimens dur ing the acute phase of infection.  Positive  results are indicative of active infection with SARS-CoV-2.  Clinical  correlation with patient history and other diagnostic information is  necessary to determine patient infection status.  Positive results do  not rule out bacterial infection or co-infection with other viruses. If result is PRESUMPTIVE POSTIVE SARS-CoV-2 nucleic acids MAY BE PRESENT.   A presumptive positive result was obtained on the submitted specimen  and confirmed on repeat testing.  While 2019 novel coronavirus  (SARS-CoV-2) nucleic acids may be present in the submitted sample  additional confirmatory testing may be necessary for epidemiological  and / or clinical management purposes  to differentiate between  SARS-CoV-2 and other Sarbecovirus currently known to infect humans.  If clinically indicated additional testing with an alternate test  methodology [phone]) is advised. The SARS-CoV-2 RNA is generally  detectable in upper and lower respiratory sp ecimens during the acute  phase of infection. The expected result is Negative. Fact Sheet for Patients:  StrictlyIdeas.no Fact Sheet for Healthcare Providers: BankingDealers.co.za This test is not yet approved or cleared by the [place] FDA and has been authorized for detection and/or diagnosis of SARS-CoV-2 by FDA under an Emergency Use Authorization (EUA).  This EUA will remain in effect (meaning this test can be used) for the duration of the COVID-19 declaration under Section 564(b)(1) of the Act, 21 U.S.C. section 360bbb-3(b)(1), unless the authorization is terminated or revoked sooner. Performed at [facility], [address] [place], [place] [address]      Radiological Exams on Admission: Ct Abdomen Pelvis W  Contrast  Result Date: 03/22/2019 CLINICAL DATA:  Acute abdominal pain, nausea, vomiting. EXAM: CT ABDOMEN AND PELVIS WITH CONTRAST TECHNIQUE: Multidetector CT imaging of the abdomen and pelvis was performed using the standard protocol following bolus administration of intravenous contrast. CONTRAST:  171m OMNIPAQUE IOHEXOL 300 MG/ML  SOLN COMPARISON:  CT scan of Dec 17, 2016. FINDINGS: Lower chest: No acute abnormality. Hepatobiliary: No focal liver abnormality is seen. No gallstones, gallbladder wall thickening, or biliary dilatation. Pancreas: Pancreatic atrophy is again noted. No acute abnormality or ductal dilatation is noted. Spleen: Normal in size without focal abnormality. Adrenals/Urinary Tract: Adrenal glands appear normal. Right kidney is unremarkable. Cortical thinning of upper pole of left kidney is noted with overall mild left renal atrophy. No hydronephrosis or renal obstruction is noted. No renal or ureteral calculi are noted. Bladder is unremarkable. Stomach/Bowel: Status post partial gastrectomy and gastrojejunostomy. There is no evidence of bowel obstruction or ileus. Status post appendectomy. Sigmoid diverticulosis is noted without inflammation. Vascular/Lymphatic: Aortic atherosclerosis. No enlarged abdominal or pelvic lymph nodes. Reproductive: Uterus and bilateral adnexa are unremarkable. Other: No abdominal wall hernia or abnormality. No abdominopelvic ascites. Musculoskeletal: Status post right hip arthroplasty. No acute abnormality is noted. IMPRESSION: Status post partial gastrectomy and gastrojejunostomy. Sigmoid diverticulosis without inflammation. Stable left renal atrophy. No acute abnormality seen in the abdomen or pelvis. Aortic Atherosclerosis (ICD10-I70.0). Electronically Signed   By: JMarijo ConceptionM.D.   On: 03/22/2019 14:53   [facility]  Result Date: 03/22/2019 CLINICAL DATA:  Vomiting, diarrhea and chills beginning yesterday. EXAM: PORTABLE CHEST 1 VIEW  COMPARISON:  PA and lateral chest 09/07/2018. FINDINGS: Right IJ Port-A-Cath is unchanged. Lungs clear. Heart size normal. No pneumothorax or pleural fluid. No acute or bony abnormality. IMPRESSION: No acute disease. Electronically Signed   By: Inge Rise M.D.   On: 03/22/2019 13:51    Assessment/Plan Principal Problem:   Acute gastroenteritis Active Problems:   Stomach cancer ([place])   Hyperlipidemia   SLEEP APNEA, OBSTRUCTIVE   Essential hypertension   GERD (gastroesophageal reflux disease)   History of CVA (cerebrovascular accident)   Diabetic polyneuropathy associated with type 2 diabetes mellitus ([place])   Morbid obesity due to excess calories ([facility])   Gastrointestinal dysmotility   CKD (chronic kidney disease) stage 3, GFR 30-59 ml/min ([place])     #1 acute gastroenteritis: No obvious inflammation on CT.  Patient will be treated symptomatically.  Initiate IV fluids.  Clear liquid diet.  Pain control as well as nausea vomiting control.  Stool studies if she moves her bowels.  #2 diabetes: Sliding scale insulin.  Hold metformin and glimepiride.  #3 GERD: Continue PPIs.  P.o. was if tolerated otherwise IVs.  #4 hypertension: Patient was originally hypotensive but currently blood pressure is normal.  Continue IV fluids and resume home regimen when tolerated  #5 history of gastrointestinal dysmotility: It appears to probably contribute to current symptoms.  Symptomatic management.  #6 chronic kidney disease stage III: Continue close monitoring.  It appears to be close to baseline  #7 history of gastric cancer: Being followed by Dr. Marin Olp.  Stable at this point.   DVT prophylaxis: SCD Code Status: Full code Family Communication: Discussed care with the patient no family at bedside Disposition Plan: Home Consults called: None Admission status: Observation  Severity of Illness: The appropriate patient status for this patient is OBSERVATION. Observation status is judged to be  reasonable and necessary in order to provide the required intensity of service to ensure the patient's safety. The patient's presenting symptoms, physical exam findings, and initial radiographic and laboratory data in the context of their medical condition is felt to place them at decreased risk for further clinical deterioration. Furthermore, it is anticipated that the patient will be medically stable for discharge from the hospital within 2 midnights of admission. The following factors support the patient status of observation.   " The patient's presenting symptoms include nausea vomiting diarrhea. " The physical exam findings include mild abdominal tenderness. " The initial radiographic and laboratory data are no obvious abnormality.     Barbette Merino MD [facility] Pager [phone]  If 7PM-7AM, please contact night-coverage [email or web address] Password [facility]  03/22/2019, 11:03 PM

## 2019-03-22 NOTE — Telephone Encounter (Signed)
Call placed to patient's daughter, [name] to inform her that pt needs to go to the ED now, but is concerned about a ride there.  [name] is appreciative of call and states that she will contact her sisters regarding getting pt to the ED now.

## 2019-03-22 NOTE — ED Notes (Signed)
ED TO INPATIENT HANDOFF REPORT  ED Nurse Name and Phone #: Marisa Hua RN [phone]  S Name/Age/Gender [name] 68 y.o. female Room/Bed: MH10/MH10  Code Status   Code Status: Prior  Home/SNF/Other Home Patient oriented to: self, place, time and situation Is this baseline? Yes   Triage Complete: Triage complete  Chief Complaint CHILLS, DIARRHEA, VOMITING  Triage Note Pt c/o n/v/d, sweats-sx started yesterday-pt entered triage in w/c-moaning-pale   Allergies Allergies  Allergen Reactions  . Adhesive [Tape] Other (See Comments)    Burn Skin  . Codeine Other (See Comments)    paranoid  . Zocor [Simvastatin - High Dose] Other (See Comments)    Muscle spasms  . Penicillins Rash    Has patient had a PCN reaction causing immediate rash, facial/tongue/throat swelling, SOB or lightheadedness with hypotension: Yes Has patient had a PCN reaction causing severe rash involving mucus membranes or skin necrosis: No Has patient had a PCN reaction that required hospitalization does not remember.  Has patient had a PCN reaction occurring within the last 10 years: No If all of the above answers are "NO", then may proceed with Cephalosporin use.     Level of Care/Admitting Diagnosis ED Disposition    ED Disposition Condition Comment   Admit  Hospital Area: [facility] [[id number]]  Level of Care: Med-Surg [16]  Covid Evaluation: Confirmed COVID Negative  Diagnosis: Acute gastroenteritis [[id number]]  Admitting Physician: Donne Hazel [[id number]]  Attending Physician: CHIU, STEPHEN K [[id number]]  PT Class (Do Not Modify): Observation [104]  PT Acc Code (Do Not Modify): Observation [10022]       B Medical/Surgery History Past Medical History:  Diagnosis Date  . Asthma   . Degenerative joint disease   . Diabetes mellitus   . Dyspnea   . GERD (gastroesophageal reflux disease)   . Hyperlipidemia    no longer on medication for this  . Hypertension    patient denies   . Myocardial infarction ([place]) 08/2018  . Neuropathy    bilateral hands and feet  . Sickle cell trait ([place])   . Sleep apnea   . stomach ca dx'd 2010   chemo/xrt comp 05/2009  . TIA (transient ischemic attack) 07/2015   Past Surgical History:  Procedure Laterality Date  . APPENDECTOMY    . CESAREAN SECTION    . COLON SURGERY     colonscopy  . COLONOSCOPY WITH PROPOFOL N/A 06/01/2016   Procedure: COLONOSCOPY WITH PROPOFOL;  Surgeon: Wilford Corner, MD;  Location: [facility] END[facility]COPY;  Service: Endoscopy;  Laterality: N/A;  . CORONARY STENT INTERVENTION N/A 08/29/2018   Procedure: CORONARY STENT INTERVENTION;  Surgeon: Wellington Hampshire, MD;  Location: [facility] CV LAB;  Service: Cardiovascular;  Laterality: N/A;  . CORONARY/GRAFT ACUTE MI REVASCULARIZATION N/A 08/27/2018   Procedure: Coronary/Graft Acute MI Revascularization;  Surgeon: Leonie Man, MD;  Location: [place] CV LAB;  Service: Cardiovascular;  Laterality: N/A;  . ESOPHAGOGASTRODUODENOSCOPY N/A 10/15/2012   Procedure: ESOPHAGOGASTRODUODENOSCOPY (EGD);  Surgeon: Lear Ng, MD;  Location: [facility];  Service: Endoscopy;  Laterality: N/A;  . ESOPHAGOGASTRODUODENOSCOPY N/A 01/15/2016   Procedure: ESOPHAGOGASTRODUODENOSCOPY (EGD);  Surgeon: Ronald Lobo, MD;  Location: [facility] ENDOSCOPY;  Service: Endoscopy;  Laterality: N/A;  . HERNIA REPAIR    . LEFT HEART CATH AND CORONARY ANGIOGRAPHY N/A 08/27/2018   Procedure: LEFT HEART CATH AND CORONARY ANGIOGRAPHY;  Surgeon: Leonie Man, MD;  Location: [place] [facility] CV LAB;  Service: Cardiovascular;  Laterality: N/A;  .  SHOULDER SURGERY     Left  . TOTAL HIP ARTHROPLASTY     Right  . TOTAL KNEE ARTHROPLASTY     Left     A IV Location/Drains/Wounds Patient Lines/Drains/Airways Status   Active Line/Drains/Airways    Name:   Placement date:   Placement time:   Site:   Days:   Implanted Port Right Chest   -    -    Chest      Peripheral IV 03/22/19 Right Antecubital    03/22/19    1339    Antecubital   less than 1   Peripheral IV 03/22/19 Left Antecubital   03/22/19    1341    Antecubital   less than 1          Intake/Output Last 24 hours No intake or output data in the 24 hours ending 03/22/19 1800  Labs/Imaging Results for orders placed or performed during the hospital encounter of 03/22/19 (from the past 48 hour(s))  Lactic acid, plasma     Status: Abnormal   Collection Time: 03/22/19  1:32 PM  Result Value Ref Range   Lactic Acid, Venous 2.1 (HH) 0.5 - 1.9 mmol/L    Comment: CRITICAL RESULT CALLED TO, READ BACK BY AND VERIFIED WITH: HARTLEY A RN [id number] PHILLIPS C Performed at [facility], [address] [place], [place] [address]   Comprehensive metabolic panel     Status: Abnormal   Collection Time: 03/22/19  1:32 PM  Result Value Ref Range   Sodium 144 135 - 145 mmol/L   Potassium 4.6 3.5 - 5.1 mmol/L   Chloride 108 98 - 111 mmol/L   CO2 24 22 - 32 mmol/L   Glucose, Bld 197 (H) 70 - 99 mg/dL   BUN 9 8 - 23 mg/dL   Creatinine, Ser 1.34 (H) 0.44 - 1.00 mg/dL   Calcium 9.0 8.9 - 10.3 mg/dL   Total Protein 6.8 6.5 - 8.1 g/dL   Albumin 2.7 (L) 3.5 - 5.0 g/dL   AST 126 (H) 15 - 41 U/L   ALT 71 (H) 0 - 44 U/L   Alkaline Phosphatase 107 38 - 126 U/L   Total Bilirubin 0.6 0.3 - 1.2 mg/dL   GFR calc non Af Amer 41 (L) >60 mL/min   GFR calc Af Amer 48 (L) >60 mL/min   Anion gap 12 5 - 15    Comment: Performed at [facility], [address] [place], [place] [address]  CBC WITH DIFFERENTIAL     Status: Abnormal   Collection Time: 03/22/19  1:32 PM  Result Value Ref Range   WBC 7.5 4.0 - 10.5 K/uL   RBC 4.79 3.87 - 5.11 MIL/uL   Hemoglobin 13.4 12.0 - 15.0 g/dL   HCT 40.6 36.0 - 46.0 %   MCV 84.8 80.0 - 100.0 fL   MCH 28.0 26.0 - 34.0 pg   MCHC 33.0 30.0 - 36.0 g/dL   RDW 17.6 (H) 11.5 - 15.5 %   Platelets 372 150 - 400 K/uL   nRBC 0.0 0.0 - 0.2 %   Neutrophils Relative % 58 %   Neutro Abs 4.3 1.7 - 7.7  K/uL   Lymphocytes Relative 33 %   Lymphs Abs 2.5 0.7 - 4.0 K/uL   Monocytes Relative 7 %   Monocytes Absolute 0.5 0.1 - 1.0 K/uL   Eosinophils Relative 2 %   Eosinophils Absolute 0.1 0.0 - 0.5 K/uL  Basophils Relative 0 %   Basophils Absolute 0.0 0.0 - 0.1 K/uL   Immature Granulocytes 0 %   Abs Immature Granulocytes 0.02 0.00 - 0.07 K/uL    Comment: Performed at [facility], [address] [place], [place] [address]  APTT     Status: None   Collection Time: 03/22/19  1:32 PM  Result Value Ref Range   aPTT 25 24 - 36 seconds    Comment: Performed at [facility], [address] [place], [place] [address]  Protime-INR     Status: None   Collection Time: 03/22/19  1:32 PM  Result Value Ref Range   Prothrombin Time 14.4 11.4 - 15.2 seconds   INR 1.1 0.8 - 1.2    Comment: (NOTE) INR goal varies based on device and disease states. Performed at [facility], [address] [place], [place] [address]   SARS Coronavirus 2 [facility] order, Performed in [facility] hospital lab) Nasopharyngeal Nasopharyngeal Swab     Status: None   Collection Time: 03/22/19  3:17 PM   Specimen: Nasopharyngeal Swab  Result Value Ref Range   SARS Coronavirus 2 NEGATIVE NEGATIVE    Comment: (NOTE) If result is NEGATIVE SARS-CoV-2 target nucleic acids are NOT DETECTED. The SARS-CoV-2 RNA is generally detectable in upper and lower  respiratory specimens during the acute phase of infection. The lowest  concentration of SARS-CoV-2 viral copies this assay can detect is 250  copies / mL. A negative result does not preclude SARS-CoV-2 infection  and should not be used as the sole basis for treatment or other  patient management decisions.  A negative result may occur with  improper specimen collection / handling, submission of specimen other  than nasopharyngeal swab, presence of viral mutation(s) within the  areas targeted by this assay, and inadequate number of viral  copies  (<250 copies / mL). A negative result must be combined with clinical  observations, patient history, and epidemiological information. If result is POSITIVE SARS-CoV-2 target nucleic acids are DETECTED. The SARS-CoV-2 RNA is generally detectable in upper and lower  respiratory specimens dur ing the acute phase of infection.  Positive  results are indicative of active infection with SARS-CoV-2.  Clinical  correlation with patient history and other diagnostic information is  necessary to determine patient infection status.  Positive results do  not rule out bacterial infection or co-infection with other viruses. If result is PRESUMPTIVE POSTIVE SARS-CoV-2 nucleic acids MAY BE PRESENT.   A presumptive positive result was obtained on the submitted specimen  and confirmed on repeat testing.  While 2019 novel coronavirus  (SARS-CoV-2) nucleic acids may be present in the submitted sample  additional confirmatory testing may be necessary for epidemiological  and / or clinical management purposes  to differentiate between  SARS-CoV-2 and other Sarbecovirus currently known to infect humans.  If clinically indicated additional testing with an alternate test  methodology [phone]) is advised. The SARS-CoV-2 RNA is generally  detectable in upper and lower respiratory sp ecimens during the acute  phase of infection. The expected result is Negative. Fact Sheet for Patients:  StrictlyIdeas.no Fact Sheet for Healthcare Providers: BankingDealers.co.za This test is not yet approved or cleared by the [place] FDA and has been authorized for detection and/or diagnosis of SARS-CoV-2 by FDA under an Emergency Use Authorization (EUA).  This EUA will remain in effect (meaning this test can be used) for the duration of the COVID-19 declaration under  Section 564(b)(1) of the Act, 21 U.S.C. section 360bbb-3(b)(1), unless the authorization is terminated  or revoked sooner. Performed at [facility], [address] [place], [place] [address]   Urinalysis, Routine w reflex microscopic     Status: Abnormal   Collection Time: 03/22/19  4:15 PM  Result Value Ref Range   Color, Urine YELLOW YELLOW   APPearance CLOUDY (A) CLEAR   Specific Gravity, Urine 1.015 1.005 - 1.030   pH 7.5 5.0 - 8.0   Glucose, UA NEGATIVE NEGATIVE mg/dL   Hgb urine dipstick NEGATIVE NEGATIVE   Bilirubin Urine NEGATIVE NEGATIVE   Ketones, ur NEGATIVE NEGATIVE mg/dL   Protein, ur NEGATIVE NEGATIVE mg/dL   Nitrite NEGATIVE NEGATIVE   Leukocytes,Ua SMALL (A) NEGATIVE    Comment: Performed at [facility], [address] [place], [place] [address]  Urinalysis, Microscopic (reflex)     Status: Abnormal   Collection Time: 03/22/19  4:15 PM  Result Value Ref Range   RBC / HPF 0-5 0 - 5 RBC/hpf   WBC, UA 0-5 0 - 5 WBC/hpf   Bacteria, UA FEW (A) NONE SEEN   Squamous Epithelial / LPF 11-20 0 - 5    Comment: Performed at [facility], [address] [place], [place] [address]  Lactic acid, plasma     Status: None   Collection Time: 03/22/19  4:33 PM  Result Value Ref Range   Lactic Acid, Venous 1.2 0.5 - 1.9 mmol/L    Comment: Performed at [facility], [address] [place], [place] [address]   Ct Abdomen Pelvis W Contrast  Result Date: 03/22/2019 CLINICAL DATA:  Acute abdominal pain, nausea, vomiting. EXAM: CT ABDOMEN AND PELVIS WITH CONTRAST TECHNIQUE: Multidetector CT imaging of the abdomen and pelvis was performed using the standard protocol following bolus administration of intravenous contrast. CONTRAST:  125m OMNIPAQUE IOHEXOL 300 MG/ML  SOLN COMPARISON:  CT scan of Dec 17, 2016. FINDINGS: Lower chest: No acute abnormality. Hepatobiliary: No focal liver abnormality is seen. No gallstones, gallbladder wall thickening, or biliary dilatation. Pancreas: Pancreatic atrophy is again noted. No acute abnormality or ductal  dilatation is noted. Spleen: Normal in size without focal abnormality. Adrenals/Urinary Tract: Adrenal glands appear normal. Right kidney is unremarkable. Cortical thinning of upper pole of left kidney is noted with overall mild left renal atrophy. No hydronephrosis or renal obstruction is noted. No renal or ureteral calculi are noted. Bladder is unremarkable. Stomach/Bowel: Status post partial gastrectomy and gastrojejunostomy. There is no evidence of bowel obstruction or ileus. Status post appendectomy. Sigmoid diverticulosis is noted without inflammation. Vascular/Lymphatic: Aortic atherosclerosis. No enlarged abdominal or pelvic lymph nodes. Reproductive: Uterus and bilateral adnexa are unremarkable. Other: No abdominal wall hernia or abnormality. No abdominopelvic ascites. Musculoskeletal: Status post right hip arthroplasty. No acute abnormality is noted. IMPRESSION: Status post partial gastrectomy and gastrojejunostomy. Sigmoid diverticulosis without inflammation. Stable left renal atrophy. No acute abnormality seen in the abdomen or pelvis. Aortic Atherosclerosis (ICD10-I70.0). Electronically Signed   By: JMarijo ConceptionM.D.   On: 03/22/2019 14:53   Dg Chest Port 1 View  Result Date: 03/22/2019 CLINICAL DATA:  Vomiting, diarrhea and chills beginning yesterday. EXAM: PORTABLE CHEST 1 VIEW COMPARISON:  PA and lateral chest 09/07/2018. FINDINGS: Right IJ Port-A-Cath is unchanged. Lungs clear. Heart size normal. No pneumothorax or pleural fluid. No acute or bony abnormality. IMPRESSION: No acute disease. Electronically Signed   By: TInge RiseM.D.  On: 03/22/2019 13:51    Pending Labs Unresulted Labs (From admission, onward)    Start     Ordered   03/22/19 1322  Blood Culture (routine x 2)  BLOOD CULTURE X 2,   STAT     03/22/19 1322   03/22/19 1322  Urine culture  ONCE - STAT,   STAT     03/22/19 1322          Vitals/Pain Today's Vitals   03/22/19 1643 03/22/19 1727 03/22/19 1730  03/22/19 1756  BP: (!) 130/100  134/77   Pulse: 66 (!) 59 60   Resp: 15 19 15    Temp: 98.4 F (36.9 C)  98.5 F (36.9 C)   TempSrc: Oral     SpO2: 100% 100% 100%   PainSc: 4  4   0-No pain    Isolation Precautions No active isolations  Medications Medications  sodium chloride 0.9 % bolus 1,000 mL (0 mLs Intravenous Stopped 03/22/19 1555)  fentaNYL (SUBLIMAZE) injection 50 mcg (50 mcg Intravenous Given 03/22/19 1339)  ondansetron (ZOFRAN) injection 4 mg (4 mg Intravenous Given 03/22/19 1339)  sodium chloride 0.9 % bolus 2,000 mL (0 mLs Intravenous Stopped 03/22/19 1727)  meropenem (MERREM) 1 g in sodium chloride 0.9 % 100 mL IVPB (0 g Intravenous Stopped 03/22/19 1413)  iohexol (OMNIPAQUE) 300 MG/ML solution 100 mL (100 mLs Intravenous Contrast Given 03/22/19 1426)    Mobility walks Low fall risk         R Recommendations: See Admitting Provider Note  Report given to:   Additional Notes:

## 2019-03-22 NOTE — ED Provider Notes (Signed)
  Physical Exam  BP (!) 130/100 (BP Location: Right Arm)   Pulse 66   Temp 98.4 F (36.9 C) (Oral)   Resp 15   SpO2 100%   Physical Exam  ED Course/Procedures     Procedures  MDM  Patient care assumed at 3 PM patient came here with diarrhea and abdominal pain and hypotension.  Patient received 30 cc/kg bolus and sepsis work-up was initiated and she received some empiric antibiotics .  Her CT abdomen pelvis was unremarkable her blood pressure improved after IV fluids. Signout pending COVID swab and urinalysis and admission.  5:10 PM UA normal. BP up to 130/100. Still nauseated. Dr. Wyline Copas to admit to med/surg at [facility].       Drenda Freeze, MD 03/22/19 1710

## 2019-03-22 NOTE — ED Notes (Signed)
Report to carelink.  

## 2019-03-23 DIAGNOSIS — K210 Gastro-esophageal reflux disease with esophagitis: Secondary | ICD-10-CM | POA: Diagnosis not present

## 2019-03-23 DIAGNOSIS — E785 Hyperlipidemia, unspecified: Secondary | ICD-10-CM | POA: Diagnosis not present

## 2019-03-23 DIAGNOSIS — J45909 Unspecified asthma, uncomplicated: Secondary | ICD-10-CM | POA: Diagnosis present

## 2019-03-23 DIAGNOSIS — D573 Sickle-cell trait: Secondary | ICD-10-CM | POA: Diagnosis present

## 2019-03-23 DIAGNOSIS — E1142 Type 2 diabetes mellitus with diabetic polyneuropathy: Secondary | ICD-10-CM

## 2019-03-23 DIAGNOSIS — I10 Essential (primary) hypertension: Secondary | ICD-10-CM | POA: Diagnosis not present

## 2019-03-23 DIAGNOSIS — Z9049 Acquired absence of other specified parts of digestive tract: Secondary | ICD-10-CM | POA: Diagnosis not present

## 2019-03-23 DIAGNOSIS — N183 Chronic kidney disease, stage 3 (moderate): Secondary | ICD-10-CM | POA: Diagnosis not present

## 2019-03-23 DIAGNOSIS — Z96652 Presence of left artificial knee joint: Secondary | ICD-10-CM | POA: Diagnosis present

## 2019-03-23 DIAGNOSIS — Z903 Acquired absence of stomach [part of]: Secondary | ICD-10-CM | POA: Diagnosis not present

## 2019-03-23 DIAGNOSIS — K5730 Diverticulosis of large intestine without perforation or abscess without bleeding: Secondary | ICD-10-CM | POA: Diagnosis present

## 2019-03-23 DIAGNOSIS — K529 Noninfective gastroenteritis and colitis, unspecified: Secondary | ICD-10-CM | POA: Diagnosis not present

## 2019-03-23 DIAGNOSIS — K219 Gastro-esophageal reflux disease without esophagitis: Secondary | ICD-10-CM | POA: Diagnosis not present

## 2019-03-23 DIAGNOSIS — Z96641 Presence of right artificial hip joint: Secondary | ICD-10-CM | POA: Diagnosis present

## 2019-03-23 DIAGNOSIS — G4733 Obstructive sleep apnea (adult) (pediatric): Secondary | ICD-10-CM

## 2019-03-23 DIAGNOSIS — Z20828 Contact with and (suspected) exposure to other viral communicable diseases: Secondary | ICD-10-CM | POA: Diagnosis not present

## 2019-03-23 DIAGNOSIS — I2510 Atherosclerotic heart disease of native coronary artery without angina pectoris: Secondary | ICD-10-CM | POA: Diagnosis present

## 2019-03-23 DIAGNOSIS — E1122 Type 2 diabetes mellitus with diabetic chronic kidney disease: Secondary | ICD-10-CM | POA: Diagnosis not present

## 2019-03-23 DIAGNOSIS — Z8673 Personal history of transient ischemic attack (TIA), and cerebral infarction without residual deficits: Secondary | ICD-10-CM | POA: Diagnosis not present

## 2019-03-23 DIAGNOSIS — R112 Nausea with vomiting, unspecified: Secondary | ICD-10-CM | POA: Diagnosis present

## 2019-03-23 DIAGNOSIS — I959 Hypotension, unspecified: Secondary | ICD-10-CM | POA: Diagnosis not present

## 2019-03-23 DIAGNOSIS — K9289 Other specified diseases of the digestive system: Secondary | ICD-10-CM | POA: Diagnosis not present

## 2019-03-23 DIAGNOSIS — N179 Acute kidney failure, unspecified: Secondary | ICD-10-CM | POA: Diagnosis not present

## 2019-03-23 DIAGNOSIS — C162 Malignant neoplasm of body of stomach: Secondary | ICD-10-CM

## 2019-03-23 DIAGNOSIS — E869 Volume depletion, unspecified: Secondary | ICD-10-CM | POA: Diagnosis not present

## 2019-03-23 DIAGNOSIS — Z6833 Body mass index (BMI) 33.0-33.9, adult: Secondary | ICD-10-CM | POA: Diagnosis not present

## 2019-03-23 DIAGNOSIS — I252 Old myocardial infarction: Secondary | ICD-10-CM | POA: Diagnosis not present

## 2019-03-23 DIAGNOSIS — K30 Functional dyspepsia: Secondary | ICD-10-CM | POA: Diagnosis present

## 2019-03-23 DIAGNOSIS — I129 Hypertensive chronic kidney disease with stage 1 through stage 4 chronic kidney disease, or unspecified chronic kidney disease: Secondary | ICD-10-CM | POA: Diagnosis not present

## 2019-03-23 LAB — GLUCOSE, CAPILLARY
Glucose-Capillary: 163 mg/dL — ABNORMAL HIGH (ref 70–99)
Glucose-Capillary: 164 mg/dL — ABNORMAL HIGH (ref 70–99)
Glucose-Capillary: 156 mg/dL — ABNORMAL HIGH (ref 70–99)
Glucose-Capillary: 143 mg/dL — ABNORMAL HIGH (ref 70–99)

## 2019-03-23 LAB — COMPREHENSIVE METABOLIC PANEL
ALT: 58 U/L — ABNORMAL HIGH (ref 0–44)
AST: 102 U/L — ABNORMAL HIGH (ref 15–41)
Albumin: 2.3 g/dL — ABNORMAL LOW (ref 3.5–5.0)
Alkaline Phosphatase: 78 U/L (ref 38–126)
Anion gap: 7 (ref 5–15)
BUN: 6 mg/dL — ABNORMAL LOW (ref 8–23)
CO2: 22 mmol/L (ref 22–32)
Calcium: 8.1 mg/dL — ABNORMAL LOW (ref 8.9–10.3)
Chloride: 116 mmol/L — ABNORMAL HIGH (ref 98–111)
Creatinine, Ser: 1.14 mg/dL — ABNORMAL HIGH (ref 0.44–1.00)
GFR calc Af Amer: 58 mL/min — ABNORMAL LOW (ref >60)
GFR calc non Af Amer: 50 mL/min — ABNORMAL LOW (ref >60)
Glucose, Bld: 158 mg/dL — ABNORMAL HIGH (ref 70–99)
Potassium: 3.8 mmol/L (ref 3.5–5.1)
Sodium: 145 mmol/L (ref 135–145)
Total Bilirubin: 0.5 mg/dL (ref 0.3–1.2)
Total Protein: 5.3 g/dL — ABNORMAL LOW (ref 6.5–8.1)

## 2019-03-23 LAB — CBC
HCT: 33.4 % — ABNORMAL LOW (ref 36.0–46.0)
Hemoglobin: 11 g/dL — ABNORMAL LOW (ref 12.0–15.0)
MCH: 28.2 pg (ref 26.0–34.0)
MCHC: 32.9 g/dL (ref 30.0–36.0)
MCV: 85.6 fL (ref 80.0–100.0)
Platelets: 306 10*3/uL (ref 150–400)
RBC: 3.9 MIL/uL (ref 3.87–5.11)
RDW: 18.1 % — ABNORMAL HIGH (ref 11.5–15.5)
WBC: 7.7 10*3/uL (ref 4.0–10.5)
nRBC: 0 % (ref 0.0–0.2)

## 2019-03-23 LAB — SARS CORONAVIRUS 2 BY RT PCR (HOSPITAL ORDER, PERFORMED IN ~~LOC~~ HOSPITAL LAB): SARS Coronavirus 2: NEGATIVE

## 2019-03-23 LAB — URINE CULTURE: Culture: NO GROWTH

## 2019-03-23 LAB — HEMOGLOBIN A1C
Hgb A1c MFr Bld: 9.2 % — ABNORMAL HIGH (ref 4.8–5.6)
Mean Plasma Glucose: 217.34 mg/dL

## 2019-03-23 MED ORDER — NITROGLYCERIN 0.4 MG SL SUBL: 0.4000 mg | SUBLINGUAL_TABLET | SUBLINGUAL | Status: DC | PRN

## 2019-03-23 MED ORDER — LOPERAMIDE HCL 2 MG PO CAPS: 2.0000 mg | ORAL_CAPSULE | Freq: Once | ORAL | Status: DC

## 2019-03-23 MED ORDER — GABAPENTIN 400 MG PO CAPS
800.0000 mg | ORAL_CAPSULE | Freq: Three times a day (TID) | ORAL | Status: DC
  Administered 2019-03-23 – 2019-03-24 (×4): 800 mg via ORAL
  Filled 2019-03-23: qty 2
  Filled 2019-03-23: qty 8
  Filled 2019-03-23 (×2): qty 2

## 2019-03-23 MED ORDER — ROSUVASTATIN CALCIUM 10 MG PO TABS
40.0000 mg | ORAL_TABLET | Freq: Every day | ORAL | Status: DC
  Administered 2019-03-23: 23:00:00 40 mg via ORAL
  Filled 2019-03-23: qty 4

## 2019-03-23 MED ORDER — FAMOTIDINE 20 MG PO TABS
40.0000 mg | ORAL_TABLET | Freq: Every day | ORAL | Status: DC
  Administered 2019-03-23: 09:00:00 40 mg via ORAL
  Filled 2019-03-23 (×2): qty 2

## 2019-03-23 MED ORDER — IPRATROPIUM-ALBUTEROL 0.5-2.5 (3) MG/3ML IN SOLN: 3.0000 mL | Freq: Four times a day (QID) | RESPIRATORY_TRACT | Status: DC | PRN

## 2019-03-23 MED ORDER — HYDROXYZINE HCL 10 MG PO TABS
10.0000 mg | ORAL_TABLET | Freq: Three times a day (TID) | ORAL | Status: DC | PRN
  Administered 2019-03-24: 10 mg via ORAL
  Filled 2019-03-23 (×2): qty 1

## 2019-03-23 MED ORDER — INSULIN ASPART 100 UNIT/ML ~~LOC~~ SOLN
0.0000 [IU] | Freq: Three times a day (TID) | SUBCUTANEOUS | Status: DC
  Administered 2019-03-23 (×3): 2 [IU] via SUBCUTANEOUS
  Administered 2019-03-24: 08:00:00 1 [IU] via SUBCUTANEOUS

## 2019-03-23 MED ORDER — ISOSORBIDE MONONITRATE ER 30 MG PO TB24
15.0000 mg | ORAL_TABLET | Freq: Every day | ORAL | Status: DC
  Administered 2019-03-23 – 2019-03-24 (×2): 15 mg via ORAL
  Filled 2019-03-23 (×2): qty 1

## 2019-03-23 MED ORDER — INSULIN DEGLUDEC 200 UNIT/ML ~~LOC~~ SOPN: 20.0000 [IU] | PEN_INJECTOR | Freq: Every day | SUBCUTANEOUS | Status: DC | PRN

## 2019-03-23 MED ORDER — LIDOCAINE-PRILOCAINE 2.5-2.5 % EX CREA
1.0000 "application " | TOPICAL_CREAM | CUTANEOUS | Status: DC | PRN
  Filled 2019-03-23: qty 5

## 2019-03-23 MED ORDER — ALBUTEROL SULFATE HFA 108 (90 BASE) MCG/ACT IN AERS: 2.0000 | INHALATION_SPRAY | Freq: Four times a day (QID) | RESPIRATORY_TRACT | Status: DC | PRN

## 2019-03-23 MED ORDER — INSULIN GLARGINE 100 UNIT/ML ~~LOC~~ SOLN
20.0000 [IU] | Freq: Every day | SUBCUTANEOUS | Status: DC
  Administered 2019-03-23 – 2019-03-24 (×2): 20 [IU] via SUBCUTANEOUS
  Filled 2019-03-23 (×2): qty 0.2

## 2019-03-23 MED ORDER — CLOPIDOGREL BISULFATE 75 MG PO TABS
75.0000 mg | ORAL_TABLET | Freq: Every day | ORAL | Status: DC
  Administered 2019-03-23 – 2019-03-24 (×2): 75 mg via ORAL
  Filled 2019-03-23 (×2): qty 1

## 2019-03-23 MED ORDER — PANTOPRAZOLE SODIUM 40 MG PO TBEC
40.0000 mg | DELAYED_RELEASE_TABLET | Freq: Every day | ORAL | Status: DC
  Administered 2019-03-23 – 2019-03-24 (×2): 40 mg via ORAL
  Filled 2019-03-23 (×2): qty 1

## 2019-03-23 MED ORDER — ALBUTEROL SULFATE (2.5 MG/3ML) 0.083% IN NEBU: 2.5000 mg | INHALATION_SOLUTION | Freq: Four times a day (QID) | RESPIRATORY_TRACT | Status: DC | PRN

## 2019-03-23 MED ORDER — METOPROLOL TARTRATE 25 MG PO TABS
25.0000 mg | ORAL_TABLET | Freq: Two times a day (BID) | ORAL | Status: DC
  Administered 2019-03-23 – 2019-03-24 (×4): 25 mg via ORAL
  Filled 2019-03-23 (×4): qty 1

## 2019-03-23 MED ORDER — ASPIRIN EC 81 MG PO TBEC
81.0000 mg | DELAYED_RELEASE_TABLET | Freq: Every day | ORAL | Status: DC
  Administered 2019-03-23 – 2019-03-24 (×2): 81 mg via ORAL
  Filled 2019-03-23 (×2): qty 1

## 2019-03-23 MED ORDER — METHOCARBAMOL 500 MG PO TABS: 500.0000 mg | ORAL_TABLET | Freq: Four times a day (QID) | ORAL | Status: DC | PRN

## 2019-03-23 MED ORDER — SIMETHICONE 80 MG PO CHEW
80.0000 mg | CHEWABLE_TABLET | Freq: Four times a day (QID) | ORAL | Status: DC | PRN
  Administered 2019-03-23: 80 mg via ORAL
  Filled 2019-03-23: qty 1

## 2019-03-23 MED ORDER — SODIUM CHLORIDE 0.9 % IV SOLN
1.0000 g | Freq: Once | INTRAVENOUS | Status: AC
  Administered 2019-03-23: 03:00:00 1 g via INTRAVENOUS
  Filled 2019-03-23: qty 1

## 2019-03-23 MED ORDER — ONDANSETRON HCL 4 MG PO TABS: 4.0000 mg | ORAL_TABLET | Freq: Three times a day (TID) | ORAL | Status: DC | PRN

## 2019-03-23 MED ORDER — SUCRALFATE 1 G PO TABS
1.0000 g | ORAL_TABLET | Freq: Two times a day (BID) | ORAL | Status: DC
  Administered 2019-03-23 – 2019-03-24 (×4): 1 g via ORAL
  Filled 2019-03-23 (×4): qty 1

## 2019-03-23 MED ORDER — TRAMADOL HCL 50 MG PO TABS
50.0000 mg | ORAL_TABLET | Freq: Four times a day (QID) | ORAL | Status: DC | PRN
  Administered 2019-03-23 (×2): 50 mg via ORAL
  Filled 2019-03-23 (×2): qty 1

## 2019-03-23 MED ORDER — SERTRALINE HCL 50 MG PO TABS
50.0000 mg | ORAL_TABLET | Freq: Every day | ORAL | Status: DC
  Administered 2019-03-23 – 2019-03-24 (×2): 50 mg via ORAL
  Filled 2019-03-23 (×2): qty 1

## 2019-03-23 NOTE — Progress Notes (Addendum)
PROGRESS NOTE  [name] KPT:[id number] DOB: 03/27/1951 DOA: 03/22/2019 PCP: Ann Held, DO   LOS: 0 days   Brief narrative:  [name] is a 68 y.o. female with medical history significant of stomach cancer being followed by Dr. Marin Olp, asthma, diabetes, GERD, hyperlipidemia, hypertension, obstructive sleep apnea, history of TIA, morbid obesity, and coronary artery disease who was seen at [facility] with 1 day of nausea, vomiting and diarrhea with abdominal pain.  Symptoms have been going on for over the day and she has had at least 5-6 stools a day.  Patient has been unable to keep anything down.  She has noted some bright red stools but had a lot of watermelon and raw tomatoes.  She was seen in the ER and treated symptomatically but did not improve symptomatically so she was admitted to the hospital.   Denied any known sick contacts including COVID-19..  In the ED, Temperature was 99.6 blood pressure 74/61, pulse 124, respiratory of 22 and oxygen sat 98% room air.  Urinalysis showed cloudy urine with small leukocytes.  WBC 0-5 many squamous epithelial cells.  Lactic acid 1.2.  COVID-19 was negative.  Glucose 197 and creatinine 1.34 otherwise chemistry appears within normal.  AST 126 ALT 71.  CBC was normal.  Chest x-ray was normal.  CT abdomen pelvis showed status post partial gastrectomy and gastrojejunostomy.  Also sigmoid diverticulosis without inflammation with no acute findings.  Patient was given IV fluids and Zofran and was admitted for further care.  Assessment/Plan:  Principal Problem:   Acute gastroenteritis Active Problems:   Stomach cancer ([place])   Hyperlipidemia   SLEEP APNEA, OBSTRUCTIVE   Essential hypertension   GERD (gastroesophageal reflux disease)   History of CVA (cerebrovascular accident)   Diabetic polyneuropathy associated with type 2 diabetes mellitus ([place])   Morbid obesity due to excess calories ([facility])   Gastrointestinal dysmotility    CKD (chronic kidney disease) stage 3, GFR 30-59 ml/min ([facility])  Acute gastroenteritis:  Patient is still symptomatic with nausea and watery diarrhea.  Will check C. difficile fecal lactoferrin patient does have history of stomach cancer.  No obvious inflammation on CT scanning of the abdomen continue IV fluids, IV antiemetics and supportive care.  Replace electrolytes as necessary.  Hypotension secondary to volume depletion.  Improved with IV fluid hydration.  We will continue with that for now.  Elevated lactate on presentation.  Improved with IV fluid hydration.  diabetes mellitus type II:  Continue sliding scale insulin.    Continue to hold metformin and glimepiride.  GERD: Continue PPIs.    Essential hypertension:  Patient was initially hypotensive and responded to IV fluid hydration.   History of gastrointestinal dysmotility: Rule out infective etiology.  Mild acute kidney injury on chronic kidney disease stage III:  Continue IV fluid hydration.  Improved creatinine levels  history of gastric cancer: Being followed by Dr. Aram Beecham as outpatient.   VTE Prophylaxis: SCD  Code Status:  Full code  Family Communication: None today  Disposition Plan: Home likely by tomorrow once the patient improves clinically.  We will change the patient's status to inpatient at this time due to need for IV fluid hydration closer monitoring of renal function including electrolyte and monitoring of clinical improvement.  Consultants:  None  Procedures:  None  Antibiotics: Anti-infectives (From admission, onward)   Start     Dose/Rate Route Frequency Ordered Stop   03/23/19 0215  meropenem (MERREM) 1 g in sodium  chloride 0.9 % 100 mL IVPB     1 g 200 mL/hr over 30 Minutes Intravenous  Once 03/23/19 0203 03/23/19 0325   03/22/19 1345  meropenem (MERREM) 1 g in sodium chloride 0.9 % 100 mL IVPB     1 g 200 mL/hr over 30 Minutes Intravenous  Once 03/22/19 1341 03/22/19 1413      Subjective: Patient still says she does not feel well so far.  Still feels nauseated.  She has had a large watery bowel movement this morning.  She has impaired appetite.  Objective: Vitals:   03/22/19 2022 03/23/19 0620  BP: 121/68 102/73  Pulse: 70 70  Resp: 20 16  Temp: 98.7 F (37.1 C) 99.3 F (37.4 C)  SpO2: 100% 100%    Intake/Output Summary (Last 24 hours) at 03/23/2019 1242 Last data filed at 03/23/2019 0800 Gross per 24 hour  Intake 1099.92 ml  Output --  Net 1099.92 ml   Filed Weights   03/22/19 2022  Weight: 96.9 kg   Body mass index is 33.46 kg/m.   Physical Exam: GENERAL: Patient is alert awake and oriented. Not in obvious distress.  Obese HENT: No scleral pallor or icterus. Pupils equally reactive to light. Oral mucosa is moist NECK: is supple, no palpable thyroid enlargement. CHEST: Clear to auscultation. No crackles or wheezes. Non tender on palpation. Diminished breath sounds bilaterally. CVS: S1 and S2 heard, no murmur. Regular rate and rhythm. No pericardial rub. ABDOMEN: Soft, mild nonspecific abdominal tenderness, bowel sounds are present. No palpable hepato-splenomegaly. EXTREMITIES: No edema. CNS: Cranial nerves are intact. No focal motor or sensory deficits. SKIN: warm and dry without rashes.  Data Review: I have personally reviewed the following laboratory data and studies,  CBC: Recent Labs  Lab 03/22/19 1332 03/23/19 0713  WBC 7.5 7.7  NEUTROABS 4.3  --   HGB 13.4 11.0*  HCT 40.6 33.4*  MCV 84.8 85.6  PLT 372 662   Basic Metabolic Panel: Recent Labs  Lab 03/22/19 1332 03/23/19 0713  NA 144 145  K 4.6 3.8  CL 108 116*  CO2 24 22  GLUCOSE 197* 158*  BUN 9 6*  CREATININE 1.34* 1.14*  CALCIUM 9.0 8.1*   Liver Function Tests: Recent Labs  Lab 03/22/19 1332 03/23/19 0713  AST 126* 102*  ALT 71* 58*  ALKPHOS 107 78  BILITOT 0.6 0.5  PROT 6.8 5.3*  ALBUMIN 2.7* 2.3*   No results for input(s): LIPASE, AMYLASE in the  last 168 hours. No results for input(s): AMMONIA in the last 168 hours. Cardiac Enzymes: No results for input(s): CKTOTAL, CKMB, CKMBINDEX, TROPONINI in the last 168 hours. BNP (last 3 results) No results for input(s): BNP in the last 8760 hours.  ProBNP (last 3 results) No results for input(s): PROBNP in the last 8760 hours.  CBG: Recent Labs  Lab 03/22/19 2223 03/23/19 0757 03/23/19 1149  GLUCAP 218* 163* 164*   Recent Results (from the past 240 hour(s))  SARS Coronavirus 2 [facility] order, Performed in [facility] hospital lab) Nasopharyngeal Nasopharyngeal Swab     Status: None   Collection Time: 03/22/19  3:17 PM   Specimen: Nasopharyngeal Swab  Result Value Ref Range Status   SARS Coronavirus 2 NEGATIVE NEGATIVE Final    Comment: (NOTE) If result is NEGATIVE SARS-CoV-2 target nucleic acids are NOT DETECTED. The SARS-CoV-2 RNA is generally detectable in upper and lower  respiratory specimens during the acute phase of infection. The lowest  concentration of SARS-CoV-2 viral copies  this assay can detect is 250  copies / mL. A negative result does not preclude SARS-CoV-2 infection  and should not be used as the sole basis for treatment or other  patient management decisions.  A negative result may occur with  improper specimen collection / handling, submission of specimen other  than nasopharyngeal swab, presence of viral mutation(s) within the  areas targeted by this assay, and inadequate number of viral copies  (<250 copies / mL). A negative result must be combined with clinical  observations, patient history, and epidemiological information. If result is POSITIVE SARS-CoV-2 target nucleic acids are DETECTED. The SARS-CoV-2 RNA is generally detectable in upper and lower  respiratory specimens dur ing the acute phase of infection.  Positive  results are indicative of active infection with SARS-CoV-2.  Clinical  correlation with patient history and other diagnostic  information is  necessary to determine patient infection status.  Positive results do  not rule out bacterial infection or co-infection with other viruses. If result is PRESUMPTIVE POSTIVE SARS-CoV-2 nucleic acids MAY BE PRESENT.   A presumptive positive result was obtained on the submitted specimen  and confirmed on repeat testing.  While 2019 novel coronavirus  (SARS-CoV-2) nucleic acids may be present in the submitted sample  additional confirmatory testing may be necessary for epidemiological  and / or clinical management purposes  to differentiate between  SARS-CoV-2 and other Sarbecovirus currently known to infect humans.  If clinically indicated additional testing with an alternate test  methodology [phone]) is advised. The SARS-CoV-2 RNA is generally  detectable in upper and lower respiratory sp ecimens during the acute  phase of infection. The expected result is Negative. Fact Sheet for Patients:  StrictlyIdeas.no Fact Sheet for Healthcare Providers: BankingDealers.co.za This test is not yet approved or cleared by the [place] FDA and has been authorized for detection and/or diagnosis of SARS-CoV-2 by FDA under an Emergency Use Authorization (EUA).  This EUA will remain in effect (meaning this test can be used) for the duration of the COVID-19 declaration under Section 564(b)(1) of the Act, 21 U.S.C. section 360bbb-3(b)(1), unless the authorization is terminated or revoked sooner. Performed at [facility], [address] [place], [place] [address]   SARS Coronavirus 2 [facility] order, Performed in [facility] hospital lab)     Status: None   Collection Time: 03/22/19 11:01 PM  Result Value Ref Range Status   SARS Coronavirus 2 NEGATIVE NEGATIVE Final    Comment: (NOTE) If result is NEGATIVE SARS-CoV-2 target nucleic acids are NOT DETECTED. The SARS-CoV-2 RNA is generally detectable in upper and lower   respiratory specimens during the acute phase of infection. The lowest  concentration of SARS-CoV-2 viral copies this assay can detect is 250  copies / mL. A negative result does not preclude SARS-CoV-2 infection  and should not be used as the sole basis for treatment or other  patient management decisions.  A negative result may occur with  improper specimen collection / handling, submission of specimen other  than nasopharyngeal swab, presence of viral mutation(s) within the  areas targeted by this assay, and inadequate number of viral copies  (<250 copies / mL). A negative result must be combined with clinical  observations, patient history, and epidemiological information. If result is POSITIVE SARS-CoV-2 target nucleic acids are DETECTED. The SARS-CoV-2 RNA is generally detectable in upper and lower  respiratory specimens dur ing the acute phase of infection.  Positive  results are indicative of active infection  with SARS-CoV-2.  Clinical  correlation with patient history and other diagnostic information is  necessary to determine patient infection status.  Positive results do  not rule out bacterial infection or co-infection with other viruses. If result is PRESUMPTIVE POSTIVE SARS-CoV-2 nucleic acids MAY BE PRESENT.   A presumptive positive result was obtained on the submitted specimen  and confirmed on repeat testing.  While 2019 novel coronavirus  (SARS-CoV-2) nucleic acids may be present in the submitted sample  additional confirmatory testing may be necessary for epidemiological  and / or clinical management purposes  to differentiate between  SARS-CoV-2 and other Sarbecovirus currently known to infect humans.  If clinically indicated additional testing with an alternate test  methodology [phone]) is advised. The SARS-CoV-2 RNA is generally  detectable in upper and lower respiratory sp ecimens during the acute  phase of infection. The expected result is Negative. Fact  Sheet for Patients:  StrictlyIdeas.no Fact Sheet for Healthcare Providers: BankingDealers.co.za This test is not yet approved or cleared by the [place] FDA and has been authorized for detection and/or diagnosis of SARS-CoV-2 by FDA under an Emergency Use Authorization (EUA).  This EUA will remain in effect (meaning this test can be used) for the duration of the COVID-19 declaration under Section 564(b)(1) of the Act, 21 U.S.C. section 360bbb-3(b)(1), unless the authorization is terminated or revoked sooner. Performed at [facility], [place] [address], [place], [place] [address]      Studies: Ct Abdomen Pelvis W Contrast  Result Date: 03/22/2019 CLINICAL DATA:  Acute abdominal pain, nausea, vomiting. EXAM: CT ABDOMEN AND PELVIS WITH CONTRAST TECHNIQUE: Multidetector CT imaging of the abdomen and pelvis was performed using the standard protocol following bolus administration of intravenous contrast. CONTRAST:  14m OMNIPAQUE IOHEXOL 300 MG/ML  SOLN COMPARISON:  CT scan of Dec 17, 2016. FINDINGS: Lower chest: No acute abnormality. Hepatobiliary: No focal liver abnormality is seen. No gallstones, gallbladder wall thickening, or biliary dilatation. Pancreas: Pancreatic atrophy is again noted. No acute abnormality or ductal dilatation is noted. Spleen: Normal in size without focal abnormality. Adrenals/Urinary Tract: Adrenal glands appear normal. Right kidney is unremarkable. Cortical thinning of upper pole of left kidney is noted with overall mild left renal atrophy. No hydronephrosis or renal obstruction is noted. No renal or ureteral calculi are noted. Bladder is unremarkable. Stomach/Bowel: Status post partial gastrectomy and gastrojejunostomy. There is no evidence of bowel obstruction or ileus. Status post appendectomy. Sigmoid diverticulosis is noted without inflammation. Vascular/Lymphatic: Aortic atherosclerosis. No enlarged  abdominal or pelvic lymph nodes. Reproductive: Uterus and bilateral adnexa are unremarkable. Other: No abdominal wall hernia or abnormality. No abdominopelvic ascites. Musculoskeletal: Status post right hip arthroplasty. No acute abnormality is noted. IMPRESSION: Status post partial gastrectomy and gastrojejunostomy. Sigmoid diverticulosis without inflammation. Stable left renal atrophy. No acute abnormality seen in the abdomen or pelvis. Aortic Atherosclerosis (ICD10-I70.0). Electronically Signed   By: JMarijo ConceptionM.D.   On: 03/22/2019 14:53   Dg Chest Port 1 View  Result Date: 03/22/2019 CLINICAL DATA:  Vomiting, diarrhea and chills beginning yesterday. EXAM: PORTABLE CHEST 1 VIEW COMPARISON:  PA and lateral chest 09/07/2018. FINDINGS: Right IJ Port-A-Cath is unchanged. Lungs clear. Heart size normal. No pneumothorax or pleural fluid. No acute or bony abnormality. IMPRESSION: No acute disease. Electronically Signed   By: TInge RiseM.D.   On: 03/22/2019 13:51    Scheduled Meds:  aspirin EC  81 mg Oral Daily   clopidogrel  75 mg Oral Daily  famotidine  40 mg Oral Daily   gabapentin  800 mg Oral TID   insulin aspart  0-9 Units Subcutaneous TID WC   insulin glargine  20 Units Subcutaneous Daily   isosorbide mononitrate  15 mg Oral Daily   metoprolol tartrate  25 mg Oral BID   pantoprazole  40 mg Oral Daily   rosuvastatin  40 mg Oral QHS   sertraline  50 mg Oral Daily   sucralfate  1 g Oral BID    Continuous Infusions:  sodium chloride 100 mL/hr at 03/23/19 0905     Flora Lipps, MD  [facility] 03/23/2019

## 2019-03-24 DIAGNOSIS — K9289 Other specified diseases of the digestive system: Secondary | ICD-10-CM

## 2019-03-24 DIAGNOSIS — E785 Hyperlipidemia, unspecified: Secondary | ICD-10-CM

## 2019-03-24 DIAGNOSIS — Z8673 Personal history of transient ischemic attack (TIA), and cerebral infarction without residual deficits: Secondary | ICD-10-CM

## 2019-03-24 LAB — BASIC METABOLIC PANEL
Anion gap: 5 (ref 5–15)
BUN: <5 mg/dL — ABNORMAL LOW (ref 8–23)
CO2: 24 mmol/L (ref 22–32)
Calcium: 8 mg/dL — ABNORMAL LOW (ref 8.9–10.3)
Chloride: 115 mmol/L — ABNORMAL HIGH (ref 98–111)
Creatinine, Ser: 1.07 mg/dL — ABNORMAL HIGH (ref 0.44–1.00)
GFR calc Af Amer: >60 mL/min (ref >60)
GFR calc non Af Amer: 54 mL/min — ABNORMAL LOW (ref >60)
Glucose, Bld: 147 mg/dL — ABNORMAL HIGH (ref 70–99)
Potassium: 3.7 mmol/L (ref 3.5–5.1)
Sodium: 144 mmol/L (ref 135–145)

## 2019-03-24 LAB — CBC
HCT: 32.1 % — ABNORMAL LOW (ref 36.0–46.0)
Hemoglobin: 10.4 g/dL — ABNORMAL LOW (ref 12.0–15.0)
MCH: 28.3 pg (ref 26.0–34.0)
MCHC: 32.4 g/dL (ref 30.0–36.0)
MCV: 87.2 fL (ref 80.0–100.0)
Platelets: 271 10*3/uL (ref 150–400)
RBC: 3.68 MIL/uL — ABNORMAL LOW (ref 3.87–5.11)
RDW: 18.4 % — ABNORMAL HIGH (ref 11.5–15.5)
WBC: 7.2 10*3/uL (ref 4.0–10.5)
nRBC: 0 % (ref 0.0–0.2)

## 2019-03-24 LAB — MAGNESIUM: Magnesium: 1.6 mg/dL — ABNORMAL LOW (ref 1.7–2.4)

## 2019-03-24 LAB — GLUCOSE, CAPILLARY: Glucose-Capillary: 125 mg/dL — ABNORMAL HIGH (ref 70–99)

## 2019-03-24 LAB — HIV ANTIBODY (ROUTINE TESTING W REFLEX): HIV Screen 4th Generation wRfx: NONREACTIVE

## 2019-03-24 MED ORDER — KETOROLAC TROMETHAMINE 30 MG/ML IJ SOLN
15.0000 mg | Freq: Once | INTRAMUSCULAR | Status: AC
  Administered 2019-03-24: 05:00:00 15 mg via INTRAVENOUS
  Filled 2019-03-24: qty 1

## 2019-03-24 MED ORDER — SIMETHICONE 80 MG PO CHEW: 80.0000 mg | CHEWABLE_TABLET | Freq: Four times a day (QID) | ORAL | 0 refills | Status: DC | PRN

## 2019-03-24 NOTE — Discharge Summary (Signed)
Physician Discharge Summary  [name] RSW:[id number] DOB: 16-Mar-1951 DOA: 03/22/2019  PCP: Ann Held, DO  Admit date: 03/22/2019 Discharge date: 03/24/2019  Admitted From: Home  Discharge disposition: Home   Recommendations for Outpatient Follow-Up:   Follow up with your primary care provider in one week.    Discharge Diagnosis:   Principal Problem:   Acute gastroenteritis Active Problems:   Stomach cancer ([place])   Hyperlipidemia   SLEEP APNEA, OBSTRUCTIVE   Essential hypertension   GERD (gastroesophageal reflux disease)   History of CVA (cerebrovascular accident)   Diabetic polyneuropathy associated with type 2 diabetes mellitus ([place])   Morbid obesity due to excess calories ([facility])   Gastrointestinal dysmotility   CKD (chronic kidney disease) stage 3, GFR 30-59 ml/min ([place])    Discharge Condition: Improved.  Diet recommendation:  Carbohydrate-modified.    Wound care: None.  Code status: Full.   History of Present Illness:   [name] a 68 y.o.femalewith medical history significant ofstomach cancer being followed by Dr. Marin Olp, asthma, diabetes, GERD, hyperlipidemia, hypertension, obstructive sleep apnea, history of TIA, morbid obesity, and coronary artery disease who was seen at [facility] with 1 day of nausea, vomiting and diarrhea with abdominal pain. Symptoms have been going on for over the day and she has had at least 5-6 stools a day. Patient has been unable to keep anything down. She has noted some bright red stools but had a lot of watermelon and raw tomatoes. She was seen in the ER and treated symptomatically but did not improve symptomatically, so she was admitted to the hospital.  Denied any known sick contacts including COVID-19.  In the ED, Temperature was 99.6 blood pressure 74/61, pulse 124, respiratory of 22 and oxygen sat 98% room air. Urinalysis showed cloudy urine with small leukocytes. WBC 0-5 many squamous  epithelial cells. Lactic acid 1.2.COVID-19 was negative.Glucose 197 and creatinine 1.34 otherwise chemistry appears within normal. AST 126 ALT 71. CBC was normal.Chest x-ray was normal.CT abdomen pelvis showed status post partial gastrectomy and gastrojejunostomy. Also sigmoid diverticulosis without inflammation with no acute findings. Patient was given IV fluid resuscitation for hypotension and was admitted for further care.   Hospital Course:  Following conditions were addressed during hospitalization,  Acute gastroenteritis: No obvious inflammation on CT scan of the abdomen.  Patient received IV fluids, IV antiemetics and supportive care during hospitalization.  Her symptoms improved with supportive care.  Patient was thought to have acute viral gastroenteritis.  Hypotension secondary to volume depletion.  Improved with IV fluid hydration.    Patient was advised to improve fluid intake at home.  Elevated lactate on presentation.  Improved with IV fluid hydration.  diabetes mellitus type KX:FGHWEX metformin and glimepiride at discharge.  GERD:Continue PPIs.   Essential hypertension: Patient was initially hypotensive and responded to IV fluid hydration.   History of gastrointestinal dysmotility:   C. difficile and fecal lactoferrin pending at the time of discharge.  Symptoms have however improved including diarrhea.  Patient will benefit from discussing with her GI physician as outpatient.  Mild acute kidney injury on chronic kidney disease stage III:  Improved with  IV fluid hydration.  Need to follow-up with her PCP as outpatient  history of gastric cancer:Being followed by Dr. Marin Olp as outpatient.   Disposition:  Patient is stable for disposition home today.     Medical Consultants:    None.   Subjective:   Today, patient feels better.  No nausea,  vomiting or abdominal pain.  No diarrhea since yesterday.  Discharge Exam:   Vitals:   03/23/19  1957 03/24/19 0645  BP: 112/82 115/71  Pulse: 71 (!) 53  Resp: 18 18  Temp: 99.5 F (37.5 C) 98.2 F (36.8 C)  SpO2: 99% 98%   Vitals:   03/23/19 0620 03/23/19 1523 03/23/19 1957 03/24/19 0645  BP: 102/73 102/65 112/82 115/71  Pulse: 70 74 71 (!) 53  Resp: 16 18 18 18   Temp: 99.3 F (37.4 C) 99.9 F (37.7 C) 99.5 F (37.5 C) 98.2 F (36.8 C)  TempSrc: Oral Oral Oral Oral  SpO2: 100% 98% 99% 98%  Weight:      Height:        General exam: Appears calm and comfortable ,Not in distress, obese HEENT:PERRL,Oral mucosa moist Respiratory system: Bilateral equal air entry, normal vesicular breath sounds, no wheezes or crackles  Cardiovascular system: S1 & S2 heard, RRR.  Gastrointestinal system: Abdomen is nondistended, soft and nontender. No organomegaly or masses felt. Normal bowel sounds heard. Central nervous system: Alert and oriented. No focal neurological deficits. Extremities: No edema, no clubbing ,no cyanosis, distal peripheral pulses palpable. Skin: No rashes, lesions or ulcers,no icterus ,no pallor MSK: Normal muscle bulk,tone ,power    Procedures:    None  The results of significant diagnostics from this hospitalization (including imaging, microbiology, ancillary and laboratory) are listed below for reference.     Diagnostic Studies:   Ct Abdomen Pelvis W Contrast  Result Date: 03/22/2019 CLINICAL DATA:  Acute abdominal pain, nausea, vomiting. EXAM: CT ABDOMEN AND PELVIS WITH CONTRAST TECHNIQUE: Multidetector CT imaging of the abdomen and pelvis was performed using the standard protocol following bolus administration of intravenous contrast. CONTRAST:  167m OMNIPAQUE IOHEXOL 300 MG/ML  SOLN COMPARISON:  CT scan of Dec 17, 2016. FINDINGS: Lower chest: No acute abnormality. Hepatobiliary: No focal liver abnormality is seen. No gallstones, gallbladder wall thickening, or biliary dilatation. Pancreas: Pancreatic atrophy is again noted. No acute abnormality or ductal  dilatation is noted. Spleen: Normal in size without focal abnormality. Adrenals/Urinary Tract: Adrenal glands appear normal. Right kidney is unremarkable. Cortical thinning of upper pole of left kidney is noted with overall mild left renal atrophy. No hydronephrosis or renal obstruction is noted. No renal or ureteral calculi are noted. Bladder is unremarkable. Stomach/Bowel: Status post partial gastrectomy and gastrojejunostomy. There is no evidence of bowel obstruction or ileus. Status post appendectomy. Sigmoid diverticulosis is noted without inflammation. Vascular/Lymphatic: Aortic atherosclerosis. No enlarged abdominal or pelvic lymph nodes. Reproductive: Uterus and bilateral adnexa are unremarkable. Other: No abdominal wall hernia or abnormality. No abdominopelvic ascites. Musculoskeletal: Status post right hip arthroplasty. No acute abnormality is noted. IMPRESSION: Status post partial gastrectomy and gastrojejunostomy. Sigmoid diverticulosis without inflammation. Stable left renal atrophy. No acute abnormality seen in the abdomen or pelvis. Aortic Atherosclerosis (ICD10-I70.0). Electronically Signed   By: JMarijo ConceptionM.D.   On: 03/22/2019 14:53   Dg Chest Port 1 View  Result Date: 03/22/2019 CLINICAL DATA:  Vomiting, diarrhea and chills beginning yesterday. EXAM: PORTABLE CHEST 1 VIEW COMPARISON:  PA and lateral chest 09/07/2018. FINDINGS: Right IJ Port-A-Cath is unchanged. Lungs clear. Heart size normal. No pneumothorax or pleural fluid. No acute or bony abnormality. IMPRESSION: No acute disease. Electronically Signed   By: TInge RiseM.D.   On: 03/22/2019 13:51     Labs:   Basic Metabolic Panel: Recent Labs  Lab 03/22/19 1332 03/23/19 0713 03/24/19 0550  NA 144 145 144  K 4.6 3.8 3.7  CL 108 116* 115*  CO2 24 22 24   GLUCOSE 197* 158* 147*  BUN 9 6* <5*  CREATININE 1.34* 1.14* 1.07*  CALCIUM 9.0 8.1* 8.0*  MG  --   --  1.6*   GFR Estimated Creatinine Clearance: 61 mL/min  (A) (by C-G formula based on SCr of 1.07 mg/dL (H)). Liver Function Tests: Recent Labs  Lab 03/22/19 1332 03/23/19 0713  AST 126* 102*  ALT 71* 58*  ALKPHOS 107 78  BILITOT 0.6 0.5  PROT 6.8 5.3*  ALBUMIN 2.7* 2.3*   No results for input(s): LIPASE, AMYLASE in the last 168 hours. No results for input(s): AMMONIA in the last 168 hours. Coagulation profile Recent Labs  Lab 03/22/19 1332  INR 1.1    CBC: Recent Labs  Lab 03/22/19 1332 03/23/19 0713 03/24/19 0550  WBC 7.5 7.7 7.2  NEUTROABS 4.3  --   --   HGB 13.4 11.0* 10.4*  HCT 40.6 33.4* 32.1*  MCV 84.8 85.6 87.2  PLT 372 306 271   Cardiac Enzymes: No results for input(s): CKTOTAL, CKMB, CKMBINDEX, TROPONINI in the last 168 hours. BNP: Invalid input(s): POCBNP CBG: Recent Labs  Lab 03/23/19 0757 03/23/19 1149 03/23/19 1604 03/23/19 2107 03/24/19 0728  GLUCAP 163* 164* 156* 143* 125*   D-Dimer No results for input(s): DDIMER in the last 72 hours. Hgb A1c Recent Labs    03/23/19 0713  HGBA1C 9.2*   Lipid Profile No results for input(s): CHOL, HDL, LDLCALC, TRIG, CHOLHDL, LDLDIRECT in the last 72 hours. Thyroid function studies No results for input(s): TSH, T4TOTAL, T3FREE, THYROIDAB in the last 72 hours.  Invalid input(s): FREET3 Anemia work up No results for input(s): VITAMINB12, FOLATE, FERRITIN, TIBC, IRON, RETICCTPCT in the last 72 hours. Microbiology Recent Results (from the past 240 hour(s))  Blood Culture (routine x 2)     Status: None (Preliminary result)   Collection Time: 03/22/19  1:25 PM   Specimen: BLOOD  Result Value Ref Range Status   Specimen Description   Final    BLOOD RIGHT ANTECUBITAL Performed at [facility], [address] [place], [place] [address]    Special Requests   Final    BOTTLES DRAWN AEROBIC AND ANAEROBIC Blood Culture adequate volume Performed at [facility], [address] [place], [place] [address]    Culture   Final    NO GROWTH  < 24 HOURS Performed at [facility], [place] [address], [place], [place] [address]    Report Status PENDING  Incomplete  Blood Culture (routine x 2)     Status: None (Preliminary result)   Collection Time: 03/22/19  1:35 PM   Specimen: BLOOD  Result Value Ref Range Status   Specimen Description   Final    BLOOD LEFT ANTECUBITAL Performed at [facility], [address] [place], [place] [address]    Special Requests   Final    BOTTLES DRAWN AEROBIC AND ANAEROBIC Blood Culture adequate volume Performed at [facility], [address] [place], [place] [address]    Culture   Final    NO GROWTH < 24 HOURS Performed at [facility], [place] [address] [place], [place] [address]    Report Status PENDING  Incomplete  SARS Coronavirus 2 [facility] order, Performed in [facility] hospital lab) Nasopharyngeal Nasopharyngeal Swab     Status: None   Collection Time: 03/22/19  3:17  PM   Specimen: Nasopharyngeal Swab  Result Value Ref Range Status   SARS Coronavirus 2 NEGATIVE NEGATIVE Final    Comment: (NOTE) If result is NEGATIVE SARS-CoV-2 target nucleic acids are NOT DETECTED. The SARS-CoV-2 RNA is generally detectable in upper and lower  respiratory specimens during the acute phase of infection. The lowest  concentration of SARS-CoV-2 viral copies this assay can detect is 250  copies / mL. A negative result does not preclude SARS-CoV-2 infection  and should not be used as the sole basis for treatment or other  patient management decisions.  A negative result may occur with  improper specimen collection / handling, submission of specimen other  than nasopharyngeal swab, presence of viral mutation(s) within the  areas targeted by this assay, and inadequate number of viral copies  (<250 copies / mL). A negative result must be combined with clinical  observations, patient history, and epidemiological information. If result is POSITIVE SARS-CoV-2 target nucleic  acids are DETECTED. The SARS-CoV-2 RNA is generally detectable in upper and lower  respiratory specimens dur ing the acute phase of infection.  Positive  results are indicative of active infection with SARS-CoV-2.  Clinical  correlation with patient history and other diagnostic information is  necessary to determine patient infection status.  Positive results do  not rule out bacterial infection or co-infection with other viruses. If result is PRESUMPTIVE POSTIVE SARS-CoV-2 nucleic acids MAY BE PRESENT.   A presumptive positive result was obtained on the submitted specimen  and confirmed on repeat testing.  While 2019 novel coronavirus  (SARS-CoV-2) nucleic acids may be present in the submitted sample  additional confirmatory testing may be necessary for epidemiological  and / or clinical management purposes  to differentiate between  SARS-CoV-2 and other Sarbecovirus currently known to infect humans.  If clinically indicated additional testing with an alternate test  methodology [phone]) is advised. The SARS-CoV-2 RNA is generally  detectable in upper and lower respiratory sp ecimens during the acute  phase of infection. The expected result is Negative. Fact Sheet for Patients:  StrictlyIdeas.no Fact Sheet for Healthcare Providers: BankingDealers.co.za This test is not yet approved or cleared by the [place] FDA and has been authorized for detection and/or diagnosis of SARS-CoV-2 by FDA under an Emergency Use Authorization (EUA).  This EUA will remain in effect (meaning this test can be used) for the duration of the COVID-19 declaration under Section 564(b)(1) of the Act, 21 U.S.C. section 360bbb-3(b)(1), unless the authorization is terminated or revoked sooner. Performed at [facility], [address] [place], [place] [address]   Urine culture     Status: None   Collection Time: 03/22/19  4:15 PM   Specimen:  In/Out Cath Urine  Result Value Ref Range Status   Specimen Description   Final    IN/OUT CATH URINE Performed at [facility], [address] [place], [place] [address]    Special Requests   Final    NONE Performed at [facility], [address] [place], [place] [address]    Culture   Final    NO GROWTH Performed at [facility], [place] [address] [place], [place] [address]    Report Status 03/23/2019 FINAL  Final  SARS Coronavirus 2 [facility] order, Performed in [facility] hospital lab)     Status: None   Collection Time: 03/22/19 11:01 PM  Result Value Ref Range Status   SARS Coronavirus 2 NEGATIVE  NEGATIVE Final    Comment: (NOTE) If result is NEGATIVE SARS-CoV-2 target nucleic acids are NOT DETECTED. The SARS-CoV-2 RNA is generally detectable in upper and lower  respiratory specimens during the acute phase of infection. The lowest  concentration of SARS-CoV-2 viral copies this assay can detect is 250  copies / mL. A negative result does not preclude SARS-CoV-2 infection  and should not be used as the sole basis for treatment or other  patient management decisions.  A negative result may occur with  improper specimen collection / handling, submission of specimen other  than nasopharyngeal swab, presence of viral mutation(s) within the  areas targeted by this assay, and inadequate number of viral copies  (<250 copies / mL). A negative result must be combined with clinical  observations, patient history, and epidemiological information. If result is POSITIVE SARS-CoV-2 target nucleic acids are DETECTED. The SARS-CoV-2 RNA is generally detectable in upper and lower  respiratory specimens dur ing the acute phase of infection.  Positive  results are indicative of active infection with SARS-CoV-2.  Clinical  correlation with patient history and other diagnostic information is  necessary to determine patient infection status.  Positive results do   not rule out bacterial infection or co-infection with other viruses. If result is PRESUMPTIVE POSTIVE SARS-CoV-2 nucleic acids MAY BE PRESENT.   A presumptive positive result was obtained on the submitted specimen  and confirmed on repeat testing.  While 2019 novel coronavirus  (SARS-CoV-2) nucleic acids may be present in the submitted sample  additional confirmatory testing may be necessary for epidemiological  and / or clinical management purposes  to differentiate between  SARS-CoV-2 and other Sarbecovirus currently known to infect humans.  If clinically indicated additional testing with an alternate test  methodology [phone]) is advised. The SARS-CoV-2 RNA is generally  detectable in upper and lower respiratory sp ecimens during the acute  phase of infection. The expected result is Negative. Fact Sheet for Patients:  StrictlyIdeas.no Fact Sheet for Healthcare Providers: BankingDealers.co.za This test is not yet approved or cleared by the [place] FDA and has been authorized for detection and/or diagnosis of SARS-CoV-2 by FDA under an Emergency Use Authorization (EUA).  This EUA will remain in effect (meaning this test can be used) for the duration of the COVID-19 declaration under Section 564(b)(1) of the Act, 21 U.S.C. section 360bbb-3(b)(1), unless the authorization is terminated or revoked sooner. Performed at [facility], [place] [address], [place], [place] [address]      Discharge Instructions:   Discharge Instructions    Call MD for:  persistant nausea and vomiting   Complete by: As directed    Diet - low sodium heart healthy   Complete by: As directed    Diabetic diet. Slowly advance diet   Discharge instructions   Complete by: As directed    Follow-up with your primary care physician within 1 week.  Increase fluid intake.  Follow-up with your GI doctor if you persist to have abdominal symptoms.    Increase activity slowly   Complete by: As directed      Allergies as of 03/24/2019      Reactions   Adhesive [tape] Other (See Comments)   Burn Skin   Codeine Other (See Comments)   paranoid   Zocor [simvastatin - High Dose] Other (See Comments)   Muscle spasms   Penicillins Rash   Has patient had a PCN reaction causing immediate rash, facial/tongue/throat swelling, SOB or lightheadedness with hypotension: Yes  Has patient had a PCN reaction causing severe rash involving mucus membranes or skin necrosis: No Has patient had a PCN reaction that required hospitalization does not remember.  Has patient had a PCN reaction occurring within the last 10 years: No If all of the above answers are "NO", then may proceed with Cephalosporin use.      Medication List    STOP taking these medications   cephALEXin 500 MG capsule Commonly known as: KEFLEX   multivitamin with minerals Tabs tablet     TAKE these medications   albuterol (2.5 MG/3ML) 0.083% nebulizer solution Commonly known as: PROVENTIL Take 3 mLs (2.5 mg total) by nebulization every 6 (six) hours as needed for wheezing or shortness of breath.   albuterol 108 (90 Base) MCG/ACT inhaler Commonly known as: VENTOLIN HFA Inhale 2 puffs into the lungs every 6 (six) hours as needed for wheezing or shortness of breath.   aspirin EC 81 MG tablet Take 1 tablet (81 mg total) by mouth daily.   B-D UF III MINI PEN NEEDLES 31G X 5 MM Misc Generic drug: Insulin Pen Needle Use to inject insulin once a day.   Centrum Silver Adult 50+ Tabs Take 1 tablet once a day   clopidogrel 75 MG tablet Commonly known as: PLAVIX TAKE 4 TABLETS BY MOUTH ON DAY 1 THEN, 1TAB DAILY What changed: See the new instructions.   famotidine 40 MG tablet Commonly known as: Pepcid Take 1 tablet (40 mg total) by mouth daily.   fluticasone 50 MCG/ACT nasal spray Commonly known as: FLONASE Place 2 sprays into both nostrils daily. What changed:   when to  take this  reasons to take this   gabapentin 800 MG tablet Commonly known as: NEURONTIN TAKE ONE TABLET BY MOUTH THREE TIMES DAILY ;;   glimepiride 4 MG tablet Commonly known as: AMARYL Take 1 tablet (4 mg total) by mouth 2 (two) times daily.   glucose blood test strip Commonly known as: OneTouch Verio Use as instructed to check blood sugar 3 times a day   Insulin Degludec 200 UNIT/ML Sopn Commonly known as: [place] FlexTouch Inject 20 Units into the skin daily. What changed:   when to take this  reasons to take this   ipratropium-albuterol 0.5-2.5 (3) MG/3ML Soln Commonly known as: DUONEB Take 3 mLs by nebulization every 6 (six) hours as needed. What changed: reasons to take this   isosorbide mononitrate 30 MG 24 hr tablet Commonly known as: IMDUR Take 0.5 tablets (15 mg total) by mouth daily.   lidocaine-prilocaine cream Commonly known as: EMLA Apply 1 application topically as needed. What changed: reasons to take this   metFORMIN 500 MG tablet Commonly known as: GLUCOPHAGE Take 1 tablet (500 mg total) by mouth 2 (two) times daily with a meal.   methocarbamol 500 MG tablet Commonly known as: Robaxin Take 1 tablet (500 mg total) by mouth every 6 (six) hours as needed for muscle spasms.   metoprolol tartrate 25 MG tablet Commonly known as: LOPRESSOR Take 1 tablet (25 mg total) by mouth 2 (two) times daily.   nitroGLYCERIN 0.4 MG SL tablet Commonly known as: Nitrostat Place 1 tablet (0.4 mg total) under the tongue every 5 (five) minutes as needed.   ondansetron 4 MG tablet Commonly known as: ZOFRAN TAKE ONE TABLET BY MOUTH EVERY EIGHT HOURS AS NEEDED FOR NAUSEA AND VOMITING   pantoprazole 40 MG tablet Commonly known as: PROTONIX Take 1 tablet (40 mg total) by mouth daily.  rosuvastatin 40 MG tablet Commonly known as: CRESTOR TAKE ONE TABLET BY MOUTH EVERY DAY AT BEDTIME   sertraline 50 MG tablet Commonly known as: ZOLOFT Take 1 tablet (50 mg total)  by mouth daily.   simethicone 80 MG chewable tablet Commonly known as: MYLICON Chew 1 tablet (80 mg total) by mouth 4 (four) times daily as needed for flatulence.   sucralfate 1 g tablet Commonly known as: CARAFATE TAKE 1 TABLET BY MOUTH TWICE DAILY   traMADol 50 MG tablet Commonly known as: ULTRAM TAKE 2 TABLETS BY MOUTH 3 TIMES DAILY AS NEEDED FOR MODERATE PAIN        Time coordinating discharge: 39 minutes  Signed:  Gwyndolyn Guilford  [facility] 03/24/2019, 9:29 AM

## 2019-03-25 ENCOUNTER — Telehealth: Payer: Self-pay | Admitting: *Deleted

## 2019-03-25 NOTE — Telephone Encounter (Signed)
Pt calling back. Please call back

## 2019-03-25 NOTE — Telephone Encounter (Signed)
lvm

## 2019-03-25 NOTE — Telephone Encounter (Signed)
Transition Care Management Follow-up Telephone Call   Date discharged? 03/24/19   How have you been since you were released from the hospital? "I am feeling good now"   Do you understand why you were in the hospital? yes   Do you understand the discharge instructions? yes   Where were you discharged to? home   Items Reviewed:  Medications reviewed: no, pt states no changes to meds  Allergies reviewed: yes  Dietary changes reviewed: yes  Referrals reviewed: yes   Functional Questionnaire:   Activities of Daily Living (ADLs):   She states they are independent in the following: ambulation, bathing and hygiene, feeding, continence, grooming, toileting and dressing States they require assistance with the following: na. Uses walker when she goes out.   Any transportation issues/concerns?: no   Any patient concerns? no   Confirmed importance and date/time of follow-up visits scheduled yes  Provider Appointment booked with PCP virtually 03/27/19  Confirmed with patient if condition begins to worsen call PCP or go to the ER.  Patient was given the office number and encouraged to call back with question or concerns.  : yes

## 2019-03-27 ENCOUNTER — Other Ambulatory Visit: Payer: Self-pay

## 2019-03-27 ENCOUNTER — Telehealth: Payer: Self-pay | Admitting: Hematology & Oncology

## 2019-03-27 ENCOUNTER — Ambulatory Visit (INDEPENDENT_AMBULATORY_CARE_PROVIDER_SITE_OTHER): Payer: Medicare HMO | Admitting: Family Medicine

## 2019-03-27 ENCOUNTER — Encounter: Payer: Self-pay | Admitting: Family Medicine

## 2019-03-27 DIAGNOSIS — E1169 Type 2 diabetes mellitus with other specified complication: Secondary | ICD-10-CM | POA: Diagnosis not present

## 2019-03-27 DIAGNOSIS — R296 Repeated falls: Secondary | ICD-10-CM | POA: Diagnosis not present

## 2019-03-27 DIAGNOSIS — N183 Chronic kidney disease, stage 3 unspecified: Secondary | ICD-10-CM

## 2019-03-27 DIAGNOSIS — I10 Essential (primary) hypertension: Secondary | ICD-10-CM

## 2019-03-27 DIAGNOSIS — K529 Noninfective gastroenteritis and colitis, unspecified: Secondary | ICD-10-CM | POA: Diagnosis not present

## 2019-03-27 DIAGNOSIS — E785 Hyperlipidemia, unspecified: Secondary | ICD-10-CM

## 2019-03-27 LAB — CULTURE, BLOOD (ROUTINE X 2)
Culture: NO GROWTH
Culture: NO GROWTH
Special Requests: ADEQUATE
Special Requests: ADEQUATE

## 2019-03-27 NOTE — Progress Notes (Signed)
Virtual Visit via Video Note  I connected with [name] on 03/27/19 at 11:40 AM EDT by a video enabled telemedicine application and verified that I am speaking with the correct person using two identifiers.  Location: Patient: home  Prov home    I discussed the limitations of evaluation and management by telemedicine and the availability of in person appointments. The patient expressed understanding and agreed to proceed.  History of Present Illness: Pt is home --she was in the hosp 8/28-8/30 for gastroenteritis   She improved with IVF.    Her symptoms have completely resolved.  She will f/u with Dr Michail Sermon.   Yesterday pt fell.  She was doing laundry and she was sitting on her walker and the wheel got caught on something and the walker threw her to the ground.  She hit her head and ems came to get her up.  She states she is ok.  A little achy but no bad headaches or visual changes.     Observations/Objective: No vitals  Wt 213   Pt is in NAd   Assessment and Plan: 1. Falls frequently D/w pt PT but she wants to wait--she has an apt full of boxes because she just moved --once she gets settled we will discuss again   2. Gastroenteritis Resolved She will f/u with Dr Michail Sermon   3. CKD (chronic kidney disease) stage 3, GFR 30-59 ml/min (HCC) Recheck labs  - Comprehensive metabolic panel; Future  4. Hyperlipidemia associated with type 2 diabetes mellitus ([place]) Tolerating statin, encouraged heart healthy diet, avoid trans fats, minimize simple carbs and saturated fats. Increase exercise as tolerated - Lipid panel; Future - Comprehensive metabolic panel; Future  5. Essential hypertension Well controlled, no changes to meds. Encouraged heart healthy diet such as the DASH diet and exercise as tolerated. ==--- unable to check today - Lipid panel; Future - Comprehensive metabolic panel; Future   Follow Up Instructions:    I discussed the assessment and treatment plan with the  patient. The patient was provided an opportunity to ask questions and all were answered. The patient agreed with the plan and demonstrated an understanding of the instructions.   The patient was advised to call back or seek an in-person evaluation if the symptoms worsen or if the condition fails to improve as anticipated.  I provided 25 minutes of non-face-to-face time during this encounter.   Ann Held, DO

## 2019-03-27 NOTE — Telephone Encounter (Signed)
Patient called in to reschedule appointment for hospital follow up.  She was rescheduled and per Lorriane Shire OK to work in w/ Dr Marin Olp

## 2019-03-27 NOTE — Assessment & Plan Note (Signed)
Recheck labs 

## 2019-03-29 ENCOUNTER — Other Ambulatory Visit: Payer: Self-pay | Admitting: Physician Assistant

## 2019-04-03 ENCOUNTER — Encounter: Payer: Self-pay | Admitting: Hematology & Oncology

## 2019-04-03 ENCOUNTER — Inpatient Hospital Stay: Payer: Medicare HMO | Attending: Hematology & Oncology

## 2019-04-03 ENCOUNTER — Other Ambulatory Visit: Payer: Self-pay

## 2019-04-03 ENCOUNTER — Inpatient Hospital Stay: Payer: Medicare HMO

## 2019-04-03 ENCOUNTER — Inpatient Hospital Stay (HOSPITAL_BASED_OUTPATIENT_CLINIC_OR_DEPARTMENT_OTHER): Payer: Medicare HMO | Admitting: Hematology & Oncology

## 2019-04-03 VITALS — BP 103/67 | HR 72

## 2019-04-03 DIAGNOSIS — Z95828 Presence of other vascular implants and grafts: Secondary | ICD-10-CM

## 2019-04-03 DIAGNOSIS — I10 Essential (primary) hypertension: Secondary | ICD-10-CM | POA: Diagnosis not present

## 2019-04-03 DIAGNOSIS — E1169 Type 2 diabetes mellitus with other specified complication: Secondary | ICD-10-CM | POA: Diagnosis not present

## 2019-04-03 DIAGNOSIS — E1140 Type 2 diabetes mellitus with diabetic neuropathy, unspecified: Secondary | ICD-10-CM | POA: Insufficient documentation

## 2019-04-03 DIAGNOSIS — R350 Frequency of micturition: Secondary | ICD-10-CM | POA: Insufficient documentation

## 2019-04-03 DIAGNOSIS — Z880 Allergy status to penicillin: Secondary | ICD-10-CM | POA: Insufficient documentation

## 2019-04-03 DIAGNOSIS — Z79899 Other long term (current) drug therapy: Secondary | ICD-10-CM | POA: Insufficient documentation

## 2019-04-03 DIAGNOSIS — E1121 Type 2 diabetes mellitus with diabetic nephropathy: Secondary | ICD-10-CM

## 2019-04-03 DIAGNOSIS — R0602 Shortness of breath: Secondary | ICD-10-CM | POA: Insufficient documentation

## 2019-04-03 DIAGNOSIS — Z885 Allergy status to narcotic agent status: Secondary | ICD-10-CM | POA: Diagnosis not present

## 2019-04-03 DIAGNOSIS — D509 Iron deficiency anemia, unspecified: Secondary | ICD-10-CM | POA: Diagnosis not present

## 2019-04-03 DIAGNOSIS — R5383 Other fatigue: Secondary | ICD-10-CM | POA: Insufficient documentation

## 2019-04-03 DIAGNOSIS — Z888 Allergy status to other drugs, medicaments and biological substances status: Secondary | ICD-10-CM | POA: Diagnosis not present

## 2019-04-03 DIAGNOSIS — R110 Nausea: Secondary | ICD-10-CM | POA: Diagnosis not present

## 2019-04-03 DIAGNOSIS — C162 Malignant neoplasm of body of stomach: Secondary | ICD-10-CM | POA: Diagnosis present

## 2019-04-03 DIAGNOSIS — C161 Malignant neoplasm of fundus of stomach: Secondary | ICD-10-CM | POA: Diagnosis not present

## 2019-04-03 DIAGNOSIS — R531 Weakness: Secondary | ICD-10-CM | POA: Diagnosis not present

## 2019-04-03 DIAGNOSIS — C169 Malignant neoplasm of stomach, unspecified: Secondary | ICD-10-CM | POA: Insufficient documentation

## 2019-04-03 DIAGNOSIS — D508 Other iron deficiency anemias: Secondary | ICD-10-CM | POA: Diagnosis present

## 2019-04-03 DIAGNOSIS — E785 Hyperlipidemia, unspecified: Secondary | ICD-10-CM | POA: Diagnosis not present

## 2019-04-03 LAB — CMP (CANCER CENTER ONLY)
ALT: 32 U/L (ref 0–44)
AST: 31 U/L (ref 15–41)
Albumin: 3.1 g/dL — ABNORMAL LOW (ref 3.5–5.0)
Alkaline Phosphatase: 126 U/L (ref 38–126)
Anion gap: 7 (ref 5–15)
BUN: 14 mg/dL (ref 8–23)
CO2: 28 mmol/L (ref 22–32)
Calcium: 9 mg/dL (ref 8.9–10.3)
Chloride: 107 mmol/L (ref 98–111)
Creatinine: 1.23 mg/dL — ABNORMAL HIGH (ref 0.44–1.00)
GFR, Est AFR Am: 53 mL/min — ABNORMAL LOW (ref >60)
GFR, Estimated: 45 mL/min — ABNORMAL LOW (ref >60)
Glucose, Bld: 175 mg/dL — ABNORMAL HIGH (ref 70–99)
Potassium: 5.3 mmol/L — ABNORMAL HIGH (ref 3.5–5.1)
Sodium: 142 mmol/L (ref 135–145)
Total Bilirubin: 0.4 mg/dL (ref 0.3–1.2)
Total Protein: 6.4 g/dL — ABNORMAL LOW (ref 6.5–8.1)

## 2019-04-03 LAB — CBC WITH DIFFERENTIAL (CANCER CENTER ONLY)
Abs Immature Granulocytes: 0.02 10*3/uL (ref 0.00–0.07)
Basophils Absolute: 0 10*3/uL (ref 0.0–0.1)
Basophils Relative: 1 %
Eosinophils Absolute: 0.4 10*3/uL (ref 0.0–0.5)
Eosinophils Relative: 6 %
HCT: 35.2 % — ABNORMAL LOW (ref 36.0–46.0)
Hemoglobin: 11.6 g/dL — ABNORMAL LOW (ref 12.0–15.0)
Immature Granulocytes: 0 %
Lymphocytes Relative: 37 %
Lymphs Abs: 2.7 10*3/uL (ref 0.7–4.0)
MCH: 28 pg (ref 26.0–34.0)
MCHC: 33 g/dL (ref 30.0–36.0)
MCV: 85 fL (ref 80.0–100.0)
Monocytes Absolute: 0.6 10*3/uL (ref 0.1–1.0)
Monocytes Relative: 9 %
Neutro Abs: 3.5 10*3/uL (ref 1.7–7.7)
Neutrophils Relative %: 47 %
Platelet Count: 339 10*3/uL (ref 150–400)
RBC: 4.14 MIL/uL (ref 3.87–5.11)
RDW: 17.9 % — ABNORMAL HIGH (ref 11.5–15.5)
WBC Count: 7.4 10*3/uL (ref 4.0–10.5)
nRBC: 0 % (ref 0.0–0.2)

## 2019-04-03 MED ORDER — HEPARIN SOD (PORK) LOCK FLUSH 100 UNIT/ML IV SOLN
500.0000 [IU] | Freq: Once | INTRAVENOUS | Status: AC
  Administered 2019-04-03: 12:00:00 500 [IU] via INTRAVENOUS
  Filled 2019-04-03: qty 5

## 2019-04-03 MED ORDER — SODIUM CHLORIDE 0.9% FLUSH
10.0000 mL | INTRAVENOUS | Status: DC | PRN
  Administered 2019-04-03: 10 mL via INTRAVENOUS
  Filled 2019-04-03: qty 10

## 2019-04-03 MED ORDER — DARIFENACIN HYDROBROMIDE ER 7.5 MG PO TB24: 7.5000 mg | ORAL_TABLET | Freq: Every day | ORAL | 2 refills | Status: DC

## 2019-04-03 NOTE — Progress Notes (Signed)
Port was flushing well but no blood return. Lab samples drawn peripherally.

## 2019-04-03 NOTE — Progress Notes (Signed)
Hematology and Oncology Follow Up Visit  [name] [id number] [birth year] 68 y.o. 04/03/2019   Principle Diagnosis:  Stage IB (T1, N1, M0) adenocarcinoma of the stomach Iron deficiency anemia  Current Therapy:   Observation IV iron as indicated - last received in December 2018   Interim History: [name] is here today for follow-up.  She is feeling better.  The problem that she has that she keeps falling.  She does not pass out.  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  She has been to the emergency room a couple times.  She has had scans done.  Thankfully, there is been no bleeding or broken bones.  I probably would have to get her to a neurologist to see if they may be able to help her out.  She is also complaining of urinary frequency at nighttime.  Again I am not sure why this is although I suspect that it might be from high blood sugars.  I am not sure how well her blood sugars are being controlled at home.  I will try her on some Enablex (7.5 mg p.o. nightly) and see if this helps her.  She has had no fever.  She has had no bleeding.  There is been no diarrhea.  There is no leg swelling.  She has some thyroid issues.  I know that her primary care doctor, [name] is doing a great job in helping to manage this.  We do watch her iron levels.  Last month, her ferritin was 271 with an iron saturation of 31%.  Overall, her performance status is ECOG 1-2.    Medications:  Allergies as of 04/03/2019      Reactions   Adhesive [tape] Other (See Comments)   Burn Skin   Codeine Other (See Comments)   paranoid   Zocor [simvastatin - High Dose] Other (See Comments)   Muscle spasms   Penicillins Rash   Has patient had a PCN reaction causing immediate rash, facial/tongue/throat swelling, SOB or lightheadedness with hypotension: Yes Has patient had a PCN reaction causing severe rash involving mucus membranes or skin  necrosis: No Has patient had a PCN reaction that required hospitalization does not remember.  Has patient had a PCN reaction occurring within the last 10 years: No If all of the above answers are "NO", then may proceed with Cephalosporin use.      Medication List       Accurate as of April 03, 2019  4:12 PM. If you have any questions, ask your nurse or doctor.        albuterol (2.5 MG/3ML) 0.083% nebulizer solution Commonly known as: PROVENTIL Take 3 mLs (2.5 mg total) by nebulization every 6 (six) hours as needed for wheezing or shortness of breath.   albuterol 108 (90 Base) MCG/ACT inhaler Commonly known as: VENTOLIN HFA Inhale 2 puffs into the lungs every 6 (six) hours as needed for wheezing or shortness of breath.   aspirin EC 81 MG tablet Take 1 tablet (81 mg total) by mouth daily.   B-D UF III MINI PEN NEEDLES 31G X 5 MM Misc Generic drug: Insulin Pen Needle Use to inject insulin once a day.   Centrum Silver Adult 50+ Tabs Take 1 tablet once a day   clopidogrel 75 MG tablet Commonly known as: PLAVIX TAKE 4 TABLETS BY MOUTH ON DAY 1 THEN, 1TAB DAILY What changed: See the  new instructions.   famotidine 40 MG tablet Commonly known as: Pepcid Take 1 tablet (40 mg total) by mouth daily.   fluticasone 50 MCG/ACT nasal spray Commonly known as: FLONASE Place 2 sprays into both nostrils daily. What changed:   when to take this  reasons to take this   gabapentin 800 MG tablet Commonly known as: NEURONTIN TAKE ONE TABLET BY MOUTH THREE TIMES DAILY ;;   glimepiride 4 MG tablet Commonly known as: AMARYL Take 1 tablet (4 mg total) by mouth 2 (two) times daily.   glucose blood test strip Commonly known as: OneTouch Verio Use as instructed to check blood sugar 3 times a day   Insulin Degludec 200 UNIT/ML Sopn Commonly known as: [place] FlexTouch Inject 20 Units into the skin daily. What changed:   when to take this  reasons to take this    ipratropium-albuterol 0.5-2.5 (3) MG/3ML Soln Commonly known as: DUONEB Take 3 mLs by nebulization every 6 (six) hours as needed. What changed: reasons to take this   isosorbide mononitrate 30 MG 24 hr tablet Commonly known as: IMDUR Take 0.5 tablets (15 mg total) by mouth daily. *NEEDS OFFICE VISIT*   lidocaine-prilocaine cream Commonly known as: EMLA Apply 1 application topically as needed. What changed: reasons to take this   metFORMIN 500 MG tablet Commonly known as: GLUCOPHAGE Take 1 tablet (500 mg total) by mouth 2 (two) times daily with a meal.   methocarbamol 500 MG tablet Commonly known as: Robaxin Take 1 tablet (500 mg total) by mouth every 6 (six) hours as needed for muscle spasms.   metoprolol tartrate 25 MG tablet Commonly known as: LOPRESSOR Take 1 tablet (25 mg total) by mouth 2 (two) times daily.   nitroGLYCERIN 0.4 MG SL tablet Commonly known as: Nitrostat Place 1 tablet (0.4 mg total) under the tongue every 5 (five) minutes as needed.   ondansetron 4 MG tablet Commonly known as: ZOFRAN TAKE ONE TABLET BY MOUTH EVERY EIGHT HOURS AS NEEDED FOR NAUSEA AND VOMITING   pantoprazole 40 MG tablet Commonly known as: PROTONIX Take 1 tablet (40 mg total) by mouth daily.   rosuvastatin 40 MG tablet Commonly known as: CRESTOR TAKE ONE TABLET BY MOUTH EVERY DAY AT BEDTIME   sertraline 50 MG tablet Commonly known as: ZOLOFT Take 1 tablet (50 mg total) by mouth daily.   simethicone 80 MG chewable tablet Commonly known as: MYLICON Chew 1 tablet (80 mg total) by mouth 4 (four) times daily as needed for flatulence.   sucralfate 1 g tablet Commonly known as: CARAFATE TAKE 1 TABLET BY MOUTH TWICE DAILY   traMADol 50 MG tablet Commonly known as: ULTRAM TAKE 2 TABLETS BY MOUTH 3 TIMES DAILY AS NEEDED FOR MODERATE PAIN       Allergies:  Allergies  Allergen Reactions  . Adhesive [Tape] Other (See Comments)    Burn Skin  . Codeine Other (See Comments)     paranoid  . Zocor [Simvastatin - High Dose] Other (See Comments)    Muscle spasms  . Penicillins Rash    Has patient had a PCN reaction causing immediate rash, facial/tongue/throat swelling, SOB or lightheadedness with hypotension: Yes Has patient had a PCN reaction causing severe rash involving mucus membranes or skin necrosis: No Has patient had a PCN reaction that required hospitalization does not remember.  Has patient had a PCN reaction occurring within the last 10 years: No If all of the above answers are "NO", then may proceed with Cephalosporin use.  Past Medical History, Surgical history, Social history, and Family History were reviewed and updated.  Review of Systems: Review of Systems  Constitutional: Positive for malaise/fatigue.  HENT: Negative.   Eyes: Negative.   Respiratory: Positive for shortness of breath.   Cardiovascular: Negative.   Gastrointestinal: Positive for nausea.  Genitourinary: Negative.   Musculoskeletal: Negative.   Skin: Negative.   Neurological: Positive for focal weakness.  Endo/Heme/Allergies: Negative.      Physical Exam:  blood pressure is 103/67 and her pulse is 72. Her oxygen saturation is 100%.   Wt Readings from Last 3 Encounters:  03/22/19 213 lb 10 oz (96.9 kg)  03/07/19 214 lb (97.1 kg)  12/20/18 218 lb (98.9 kg)    Physical Exam Vitals signs reviewed.  HENT:     Head: Normocephalic and atraumatic.  Eyes:     Pupils: Pupils are equal, round, and reactive to light.  Neck:     Musculoskeletal: Normal range of motion.  Cardiovascular:     Rate and Rhythm: Normal rate and regular rhythm.     Heart sounds: Normal heart sounds.  Pulmonary:     Effort: Pulmonary effort is normal.     Breath sounds: Normal breath sounds.  Abdominal:     General: Bowel sounds are normal.     Palpations: Abdomen is soft.  Musculoskeletal: Normal range of motion.        General: No tenderness or deformity.  Lymphadenopathy:     Cervical:  No cervical adenopathy.  Skin:    General: Skin is warm and dry.     Findings: No erythema or rash.  Neurological:     Mental Status: She is alert and oriented to person, place, and time.  Psychiatric:        Behavior: Behavior normal.        Thought Content: Thought content normal.        Judgment: Judgment normal.      Lab Results  Component Value Date   WBC 7.4 04/03/2019   HGB 11.6 (L) 04/03/2019   HCT 35.2 (L) 04/03/2019   MCV 85.0 04/03/2019   PLT 339 04/03/2019   Lab Results  Component Value Date   FERRITIN 271 02/28/2019   IRON 45 02/28/2019   TIBC 145 (L) 02/28/2019   UIBC 100 (L) 02/28/2019   IRONPCTSAT 31 02/28/2019   Lab Results  Component Value Date   RETICCTPCT 1.5 04/28/2014   RBC 4.14 04/03/2019   RETICCTABS 59.9 04/28/2014   No results found for: KPAFRELGTCHN, LAMBDASER, KAPLAMBRATIO No results found for: IGGSERUM, IGA, IGMSERUM No results found for: Odetta Pink, SPEI   Chemistry      Component Value Date/Time   NA 142 04/03/2019 1115   NA 140 02/15/2019 1440   NA 138 07/06/2017 0745   K 5.3 (H) 04/03/2019 1115   K 4.4 07/06/2017 0745   CL 107 04/03/2019 1115   CL 101 08/02/2016 1351   CO2 28 04/03/2019 1115   CO2 22 07/06/2017 0745   BUN 14 04/03/2019 1115   BUN 19 02/15/2019 1440   BUN 19.7 07/06/2017 0745   CREATININE 1.23 (H) 04/03/2019 1115   CREATININE 1.3 (H) 07/06/2017 0745      Component Value Date/Time   CALCIUM 9.0 04/03/2019 1115   CALCIUM 9.3 07/06/2017 0745   ALKPHOS 126 04/03/2019 1115   ALKPHOS 115 07/06/2017 0745   AST 31 04/03/2019 1115   AST 18 07/06/2017 0745   ALT 32  04/03/2019 1115   ALT 14 07/06/2017 0745   BILITOT 0.4 04/03/2019 1115   BILITOT 0.30 07/06/2017 0745       Impression and Plan: Ms. Warmoth is a very pleasant 68 yo African American female with history of stage IB stomach cancer. She had her tumor resected and completed adjuvant radiation and  chemo in 2010.   I really doubt that cancer will be a problem for her.  I think her diabetes clearly be what we will limit her life expectancy.  We will see what her iron studies show.  I am sure that her iron is going to be on the low side given her hemoglobin having dropped.  I will see if neurology can help with the falling.  We will plan to get her back to see [place] in 3 months.    Volanda Napoleon, MD 9/9/20204:12 PM

## 2019-04-04 ENCOUNTER — Telehealth: Payer: Self-pay | Admitting: *Deleted

## 2019-04-04 ENCOUNTER — Telehealth: Payer: Self-pay | Admitting: Hematology & Oncology

## 2019-04-04 ENCOUNTER — Other Ambulatory Visit: Payer: Self-pay | Admitting: Family Medicine

## 2019-04-04 DIAGNOSIS — G894 Chronic pain syndrome: Secondary | ICD-10-CM

## 2019-04-04 LAB — IRON AND TIBC
Iron: 65 ug/dL (ref 41–142)
Saturation Ratios: 38 % (ref 21–57)
TIBC: 170 ug/dL — ABNORMAL LOW (ref 236–444)
UIBC: 105 ug/dL — ABNORMAL LOW (ref 120–384)

## 2019-04-04 LAB — HEMOGLOBIN A1C
Hgb A1c MFr Bld: 9.1 % — ABNORMAL HIGH (ref 4.8–5.6)
Mean Plasma Glucose: 214 mg/dL

## 2019-04-04 LAB — FERRITIN: Ferritin: 263 ng/mL (ref 11–307)

## 2019-04-04 NOTE — Telephone Encounter (Signed)
-----   Message from Volanda Napoleon, MD sent at 04/04/2019 10:03 AM EDT ----- Call - iron level is ok!!  The Hgb A1C is not good!! The diabetes needs better control.  Make sure that her primary MD gets these labs!!  [name]

## 2019-04-04 NOTE — Telephone Encounter (Signed)
lmom to inform patient of Nov appts per 9/9 LOS

## 2019-04-04 NOTE — Telephone Encounter (Signed)
As noted below by Dr. Marin Olp, I left a message on her cell phone with lab results. Instructed her to call the office if she has any questions or concerns. The labs will be sent to her PCP.

## 2019-04-05 NOTE — Telephone Encounter (Signed)
Requesting: Tramadol Contract: 12/05/2017 UDS: 12/05/2017, low risk Last OV: 03/06/2019 Next OV: N/A Last Refill: 02/18/2019, #[id number] RF Database:   Please advise

## 2019-04-08 ENCOUNTER — Encounter: Payer: Self-pay | Admitting: Neurology

## 2019-04-16 ENCOUNTER — Ambulatory Visit (INDEPENDENT_AMBULATORY_CARE_PROVIDER_SITE_OTHER): Payer: Medicare HMO | Admitting: Internal Medicine

## 2019-04-16 ENCOUNTER — Other Ambulatory Visit: Payer: Self-pay

## 2019-04-16 ENCOUNTER — Encounter: Payer: Self-pay | Admitting: Internal Medicine

## 2019-04-16 DIAGNOSIS — E669 Obesity, unspecified: Secondary | ICD-10-CM | POA: Diagnosis not present

## 2019-04-16 DIAGNOSIS — E1121 Type 2 diabetes mellitus with diabetic nephropathy: Secondary | ICD-10-CM | POA: Diagnosis not present

## 2019-04-16 DIAGNOSIS — E042 Nontoxic multinodular goiter: Secondary | ICD-10-CM | POA: Diagnosis not present

## 2019-04-16 MED ORDER — OZEMPIC (0.25 OR 0.5 MG/DOSE) 2 MG/1.5ML ~~LOC~~ SOPN: 0.5000 mg | PEN_INJECTOR | SUBCUTANEOUS | 5 refills | Status: DC

## 2019-04-16 NOTE — Patient Instructions (Addendum)
Please continue: - Metformin 500 mg 2x a day with meals - Tresiba U200 20 units daily  Please start Ozempic 0.25 mg weekly in a.m. (for example on Sunday morning) x 4 weeks, then increase to 0.5 mg weekly in a.m. if no nausea or hypoglycemia.  Please return in 3 months with your sugar log.

## 2019-04-16 NOTE — Progress Notes (Signed)
Patient ID: [name], female   DOB: 08-16-1950, 68 y.o.   MRN: [id number]  Patient location: Home My location: Office  Referring Provider: Ann Held, DO  I connected with the patient on 04/16/19 at  2:04 PM EDT by a video enabled telemedicine application and verified that I am speaking with the correct person.   I discussed the limitations of evaluation and management by telemedicine and the availability of in person appointments. The patient expressed understanding and agreed to proceed.   Details of the encounter are shown below.  HPI  [name] is a 68 y.o.-year-old female, presenting for f/u for MNG and DM2, uncontrolled, insulin dependent, with complications (CAD  - s/p NSTEMI 08/2018; cerebrovascular ds - s/p CVA; CKD stage 3, PN).  Last visit 5 months ago. PCP: [name].  Since last visit, she had frequent falls.  She will see neurology.  She also has urinary incontinence at night.  MNG -She has a history of RAI treatment for overactive thyroid nodules in 2011  Reviewed previous imaging reports:  11/24/2009: 1. Normal (25%) 24 hour radioiodine uptake by the thyroid gland. 2. Autonomous nodule superiorly in the right lobe with relative suppression of activity elsewhere in the gland. There is a possible cold nodule in the mid right lobe, corresponding with the dominant nodule seen on prior CT.  01/11/2010: RAI Tx for the R autonomous thyroid nodule  09/28/2014 Thyroid U/S: 6 larger nodules, 2 of them of 3 cm   09/21/2015 Thyroid U/S: Right thyroid lobe: 4.4 x 2.5 x 2.5 cm.  - 12 x 13 x 10 mm upper pole hypoechoic nodule with calcification. Previously, this measured up to 2.0 cm.  - Complex mid lobe nodule measures 25 x 22 x 18 mm and previously measured up to 35 mm.  - Smaller lower pole hypoechoic nodule measures 10 mm.  Left thyroid lobe: 5.2 x 1.6 x 2.0 cm.  - Mid lobe nodule measures 29 x 17 x 15 mm. Previously, it measured up to 30 mm.  - 8  mm upper pole complex nodule.  - Lower pole hypoechoic nodule with calcification measures 18 x 15 x 14 mm. Previously, it measured up to 20 mm.  Isthmus Thickness: 2 mm.  5 mm nodule.  Lymphadenopathy: None visualized.  IMPRESSION: Multiple bilateral nodules are smaller than on prior studies. Stability or regression over 2 years supports benign etiology.  02/21/2017 Thyroid U/S: Nodule # 1:Right; Superior Maximum size: 1.2 cm; Other 2 dimensions: 1.1 x 1.0 cm, previously, 1.3 x 1.2 x 1.0 cm Composition: solid/almost completely solid (2) Echogenicity: hypoechoic (2) Echogenic foci: macrocalcifications (1) *Given size (>/= 1 - 1.4 cm) and appearance, a follow-up ultrasound in 1 year should be considered based on TI-RADS criteria.  Nodule # 2: Right; Mid Maximum size: 2.6 cm; Other 2 dimensions: 2.2 x 1.8 cm, previously, 2.5 x 1.8 x 2.2 cm Composition: solid/almost completely solid (2) Echogenicity: hypoechoic (2) **Given size (>/= 1.5 cm) and appearance, fine needle aspiration of this moderately suspicious nodule should be considered based on TI-RADS criteria.  Nodule # 3: Right; Inferior Maximum size: 1.2 cm; Other 2 dimensions: 0.6 x 0.9 cm, previously, 1.1 x 1.0 x 0.9 cm Composition: solid/almost completely solid (2) Echogenicity: hyperechoic (1) Given size (<1.4 cm) and appearance, this nodule does NOT meet TI-RADS criteria for biopsy or dedicated follow-up.  Nodule # 4: Left; Mid Maximum size: 2.7 cm; Other 2 dimensions: 1.4 x 1.9 cm, previously, 2.9 x 1.7  x 1.5 cm Composition: solid/almost completely solid (2) Echogenicity: hypoechoic (2) **Given size (>/= 1.5 cm) and appearance, fine needle aspiration of this moderately suspicious nodule should be considered based on TI-RADS criteria.  Nodule # 5: Left; Inferior Maximum size: 1.7 cm; Other 2 dimensions: 1.3 x 1.3 cm, previously, 1.8 x 1.4 x 1.5 cm Composition: solid/almost completely solid (2) Echogenicity:  hypoechoic (2) **Given size (>/= 1.5 cm) and appearance, fine needle aspiration of this moderately suspicious nodule should be considered based on TI-RADS criteria.  IMPRESSION: Multiple bilateral nodules are all stable in appearance and size. Nodules 2, 4, and 5 continue to meet criteria for biopsy. Nodule 1 meets criteria for annual follow-up.  I previously suggested a biopsy of the dominant 3 nodules, but she refused, in favor of just following the nodules by ultrasound and clinically.  However, she also refuses a new ultrasound now.  Reviewed patient's TFTs: Lab Results  Component Value Date   TSH 3.81 10/01/2018   TSH 3.72 09/07/2018   TSH 3.061 05/13/2016   TSH 1.241 04/01/2016   TSH 4.112 01/15/2016   Previously:  TSH 1.348 10/12/2012   TSH 2.559 03/11/2011   TSH 3.155 01/05/2011   TSH 1.823 09/24/2010   TSH 1.925 06/25/2010   Pt denies: - feeling nodules in neck - hoarseness - dysphagia - choking - SOB with lying down Pt does not have a FH of thyroid ds. No FH of thyroid cancer. No h/o radiation tx to head or neck.  No herbal supplements. No Biotin use. No recent steroids use.   She also has a history of stomach cancer - sx 2012.  DM2  Last HbA1c: Lab Results  Component Value Date   HGBA1C 9.1 (H) 04/03/2019   HGBA1C 9.2 (H) 03/23/2019   HGBA1C 13.0 (H) 08/28/2018   She is on: - Metformin 500 mg 2x a day - Tresiba 20 >> 24 >> 20 units daily- started 10/2018 She could not start [place] or other analog long-acting insulin or Jardiance due to price Stopped Amaryl 4 mg 2x a day.  She checks sugars 3 times a day: - am: n/c >> 247-300 >> 120-150 - 2h after b'fast: 120s >> n/c - lunch: n/c >> 157-198 - 2h after lunch: n/c - dinner: 185-220 - 2h after dinner: n/c - bedtime: n/c Hypoglycemia awareness at 90.  + CKD; last BUN/Cr: Lab Results  Component Value Date   BUN 14 04/03/2019   Lab Results  Component Value Date   CREATININE 1.23 (H)  04/03/2019   Lab Results  Component Value Date   GFRAA 53 (L) 04/03/2019   GFRAA >60 03/24/2019   GFRAA 58 (L) 03/23/2019   GFRAA 48 (L) 03/22/2019   GFRAA 45 (L) 02/28/2019   GFRAA 42 (L) 02/15/2019   GFRAA 41 (L) 12/20/2018   GFRAA 49 (L) 08/30/2018   GFRAA 55 (L) 08/29/2018   GFRAA 57 (L) 08/28/2018   + History of HL; latest lipid panel at goal: Lab Results  Component Value Date   CHOL 88 10/01/2018   HDL 40.60 10/01/2018   LDLCALC 18 10/01/2018   LDLDIRECT 136.0 08/20/2018   TRIG 149.0 10/01/2018   CHOLHDL 2 10/01/2018  Not on a statin.  Last eye exam: 2018: + DR reportedly   On ASA 81.  She has numbness in daily in her feet.  She takes Neurontin.  She had a STEMI in 08/2018  >> had to have stents placed.  HbA1c was very high while in the hospital.  At that time, she returned to see me after 1 year and a half from the previous appointment.  ROS: Constitutional: no weight gain/no weight loss, no fatigue, no subjective hyperthermia, no subjective hypothermia, + urinary incontinence at night Eyes: no blurry vision, no xerophthalmia ENT: no sore throat, + see hpi Cardiovascular: no CP/no SOB/no palpitations/no leg swelling Respiratory: no cough/no SOB/no wheezing Gastrointestinal: no N/no V/no D/no C/no acid reflux Musculoskeletal: no muscle aches/no joint aches Skin: no rashes, no hair loss Neurological: no tremors/+ numbness/+ tingling/no dizziness  I reviewed pt's medications, allergies, PMH, social hx, family hx, and changes were documented in the history of present illness. Otherwise, unchanged from my initial visit note.  Past Medical History:  Diagnosis Date  . Asthma   . Degenerative joint disease   . Diabetes mellitus   . Dyspnea   . GERD (gastroesophageal reflux disease)   . Hyperlipidemia    no longer on medication for this  . Hypertension    patient denies  . Myocardial infarction ([place]) 08/2018  . Neuropathy    bilateral hands and feet  .  Sickle cell trait ([place])   . Sleep apnea   . stomach ca dx'd 2010   chemo/xrt comp 05/2009  . TIA (transient ischemic attack) 07/2015   Past Surgical History:  Procedure Laterality Date  . APPENDECTOMY    . CESAREAN SECTION    . COLON SURGERY     colonscopy  . COLONOSCOPY WITH PROPOFOL N/A 06/01/2016   Procedure: COLONOSCOPY WITH PROPOFOL;  Surgeon: Wilford Corner, MD;  Location[facility] ENDOSCOPY;  Service: Endoscopy;  Laterality: N/A;  . CORONARY STENT INTERVENTION N/A 08/29/2018   Procedure: CORONARY STENT INTERVENTION;  Surgeon: Wellington Hampshire, MD;  Location: [place] CV LAB;  Service: Cardiovascular;  Laterality: N/A;  . CORONARY/GRAFT ACUTE MI REVASCULARIZATION N/A 08/27/2018   Procedure: Coronary/Graft Acute MI Revascularization;  Surgeon: Leonie Man, MD;  Location: [place] [facility] LAB;  Service: Cardiovascular;  Laterality: N/A;  . ESOPHAGOGASTRODUODENOSCOPY N/A 10/15/2012   Procedure: ESOPHAGOGASTRODUODENOSCOPY (EGD);  Surgeon: Lear Ng, MD;  Location: [facility] ENDOSCOPY;  Service: Endoscopy;  Laterality: N/A;  . ESOPHAGOGASTRODUODENOSCOPY N/A 01/15/2016   Procedure: ESOPHAGOGASTRODUODENOSCOPY (EGD);  Surgeon: Ronald Lobo, MD;  Location: [facility] ENDOSCOPY;  Service: Endoscopy;  Laterality: N/A;  . HERNIA REPAIR    . LEFT HEART CATH AND CORONARY ANGIOGRAPHY N/A 08/27/2018   Procedure: LEFT HEART CATH AND CORONARY ANGIOGRAPHY;  Surgeon: Leonie Man, MD;  Location: [place] [facility] LAB;  Service: Cardiovascular;  Laterality: N/A;  . SHOULDER SURGERY     Left  . TOTAL HIP ARTHROPLASTY     Right  . TOTAL KNEE ARTHROPLASTY     Left   History   Social History  . Marital Status: Divorced    Spouse Name: N/A  . Number of Children: N/A   Occupational History  . Not on file.   Social History Main Topics  . Smoking status: Former Smoker -- 1.00 packs/day for 15 years    Types: Cigarettes    Start date: 09/27/1973    Quit date: 10/15/1988  . Smokeless tobacco: Never  Used     Comment: quit smoking 14 years ago  . Alcohol Use: No  . Drug Use: No   Current Outpatient Medications on File Prior to Visit  Medication Sig Dispense Refill  . albuterol (PROVENTIL HFA;VENTOLIN HFA) 108 (90 Base) MCG/ACT inhaler Inhale 2 puffs into the lungs every 6 (six) hours as needed for wheezing or shortness of  breath. 1 Inhaler 2  . albuterol (PROVENTIL) (2.5 MG/3ML) 0.083% nebulizer solution Take 3 mLs (2.5 mg total) by nebulization every 6 (six) hours as needed for wheezing or shortness of breath. 75 mL 12  . aspirin EC 81 MG tablet Take 1 tablet (81 mg total) by mouth daily. 90 tablet 3  . clopidogrel (PLAVIX) 75 MG tablet TAKE 4 TABLETS BY MOUTH ON DAY 1 THEN, 1TAB DAILY (Patient taking differently: Take 75 mg by mouth daily. ) 30 tablet 6  . darifenacin (ENABLEX) 7.5 MG 24 hr tablet Take 1 tablet (7.5 mg total) by mouth daily. 30 tablet 2  . famotidine (PEPCID) 40 MG tablet Take 1 tablet (40 mg total) by mouth daily. 30 tablet 3  . fluticasone (FLONASE) 50 MCG/ACT nasal spray Place 2 sprays into both nostrils daily. (Patient taking differently: Place 2 sprays into both nostrils daily as needed for allergies or rhinitis. ) 16 g 5  . gabapentin (NEURONTIN) 800 MG tablet TAKE ONE TABLET BY MOUTH THREE TIMES DAILY ;; 90 tablet 2  . glimepiride (AMARYL) 4 MG tablet Take 1 tablet (4 mg total) by mouth 2 (two) times daily. 180 tablet 1  . glucose blood (ONETOUCH VERIO) test strip Use as instructed to check blood sugar 3 times a day 300 each 12  . Insulin Degludec (TRESIBA FLEXTOUCH) 200 UNIT/ML SOPN Inject 20 Units into the skin daily. (Patient taking differently: Inject 20 Units into the skin daily as needed (high blood sugar.). ) 3 pen 3  . Insulin Pen Needle (B-D UF III MINI PEN NEEDLES) 31G X 5 MM MISC Use to inject insulin once a day. 100 each 11  . ipratropium-albuterol (DUONEB) 0.5-2.5 (3) MG/3ML SOLN Take 3 mLs by nebulization every 6 (six) hours as needed. (Patient taking  differently: Take 3 mLs by nebulization every 6 (six) hours as needed (sob and wheezing). ) 360 mL 2  . isosorbide mononitrate (IMDUR) 30 MG 24 hr tablet Take 0.5 tablets (15 mg total) by mouth daily. *NEEDS OFFICE VISIT* 15 tablet 0  . lidocaine-prilocaine (EMLA) cream Apply 1 application topically as needed. (Patient taking differently: Apply 1 application topically as needed (to numb port site.). ) 30 g 0  . metFORMIN (GLUCOPHAGE) 500 MG tablet Take 1 tablet (500 mg total) by mouth 2 (two) times daily with a meal. 180 tablet 3  . methocarbamol (ROBAXIN) 500 MG tablet Take 1 tablet (500 mg total) by mouth every 6 (six) hours as needed for muscle spasms. 45 tablet 0  . metoprolol tartrate (LOPRESSOR) 25 MG tablet Take 1 tablet (25 mg total) by mouth 2 (two) times daily. 180 tablet 2  . Multiple Vitamins-Minerals (CENTRUM SILVER ADULT 50+) TABS Take 1 tablet once a day 90 tablet 1  . nitroGLYCERIN (NITROSTAT) 0.4 MG SL tablet Place 1 tablet (0.4 mg total) under the tongue every 5 (five) minutes as needed. 25 tablet 3  . ondansetron (ZOFRAN) 4 MG tablet TAKE ONE TABLET BY MOUTH EVERY EIGHT HOURS AS NEEDED FOR NAUSEA AND VOMITING 15 tablet 2  . pantoprazole (PROTONIX) 40 MG tablet Take 1 tablet (40 mg total) by mouth daily. 90 tablet 1  . rosuvastatin (CRESTOR) 40 MG tablet TAKE ONE TABLET BY MOUTH EVERY DAY AT BEDTIME 180 tablet 1  . sertraline (ZOLOFT) 50 MG tablet Take 1 tablet (50 mg total) by mouth daily. 90 tablet 1  . simethicone (MYLICON) 80 MG chewable tablet Chew 1 tablet (80 mg total) by mouth 4 (four) times daily as  needed for flatulence. 30 tablet 0  . sucralfate (CARAFATE) 1 g tablet TAKE 1 TABLET BY MOUTH TWICE DAILY 60 tablet 10  . traMADol (ULTRAM) 50 MG tablet TAKE TWO TABLETS BY MOUTH THREE TIMES DAILY AS NEEDED FOR MODERATE PAIN 180 tablet 4   Current Facility-Administered Medications on File Prior to Visit  Medication Dose Route Frequency Provider Last Rate Last Dose  . sodium  chloride flush (NS) 0.9 % injection 10 mL  10 mL Intravenous PRN [place], Holli Humbles, NP   10 mL at 09/13/16 1146   Allergies  Allergen Reactions  . Adhesive [Tape] Other (See Comments)    Burn Skin  . Codeine Other (See Comments)    paranoid  . Zocor [Simvastatin - High Dose] Other (See Comments)    Muscle spasms  . Penicillins Rash    Has patient had a PCN reaction causing immediate rash, facial/tongue/throat swelling, SOB or lightheadedness with hypotension: Yes Has patient had a PCN reaction causing severe rash involving mucus membranes or skin necrosis: No Has patient had a PCN reaction that required hospitalization does not remember.  Has patient had a PCN reaction occurring within the last 10 years: No If all of the above answers are "NO", then may proceed with Cephalosporin use.    Family History  Problem Relation Age of Onset  . Diabetes Brother   . Alzheimer's disease Father 23  . Cancer Mother 41  . Diabetes Maternal Grandmother   . Cancer Maternal Grandfather        lung  . Suicidality Neg Hx   . Depression Neg Hx   . Dementia Neg Hx   . Anxiety disorder Neg Hx    PE: There were no vitals taken for this visit. Wt Readings from Last 3 Encounters:  03/22/19 213 lb 10 oz (96.9 kg)  03/07/19 214 lb (97.1 kg)  12/20/18 218 lb (98.9 kg)   Constitutional:  in NAD  The physical exam was not performed (virtual visit).  ASSESSMENT: 1. MNG  2. DM2, insulin-independent, uncontrolled, with complications - CAD  - s/p NSTEMI 08/2018 - cerebrovascular ds - s/p CVA - CKD stage 3 - PN  3.  Obesity  PLAN: 1. MNG  -Reviewed the latest report of her thyroid ultrasound from 2018: Nodules are either stable or smaller.  She reviewed the biopsy in the past and she also refuses further ultrasounds. -She denies neck compression symptoms -TFTs were reviewed: Normal  2. DM2, insulin-independent, uncontrolled -Patient with history of uncontrolled diabetes, with a very high  HbA1c and history of medication and visit noncompliance.   -We tried to start insulin before last visit without success since her insurance did not cover the majority of the long-acting insulins.  At last visit sugars were very high and she mentions that she would be able to afford [place].  I advised her to start at 20 units and titrate up as needed.  We continued her metformin. -At this visit, sugars have improved significantly in the morning but they still increase in a stepwise fashion throughout the day, a sign of not enough mealtime coverage.  In an effort to avoid rapid acting insulin before meals, I suggested a GLP-1 receptor agonist.  Discussed about possible side effects.  I am hoping that her insurance will cover it.  We will start Ozempic at a low dose and advised her to increase the dose as tolerated to 0.5 mg weekly. - I advised her to: Patient Instructions  Please continue: - Metformin 500  mg 2x a day with meals - Tresiba U200 20 units daily  Please start Ozempic 0.25 mg weekly in a.m. (for example on Sunday morning) x 4 weeks, then increase to 0.5 mg weekly in a.m. if no nausea or hypoglycemia.  Please return in 3 months with your sugar log.  - we would check an HbA1c when she returns to the clinic - advised to check sugars at different times of the day - 2x a day, rotating check times - advised for yearly eye exams >> she is not UTD - return to clinic in 3 months  3.  Obesity -Weight stable since last visit -We tried to use an SGLT 2 inhibitor which would have helped with weight loss, however, this was not affordable -We will add adding a GLP-1 receptor agonist at this visit.  This will also help with weight loss.  Philemon Kingdom, MD PhD [facility] Endocrinology

## 2019-04-28 ENCOUNTER — Other Ambulatory Visit: Payer: Self-pay

## 2019-04-28 ENCOUNTER — Emergency Department (HOSPITAL_BASED_OUTPATIENT_CLINIC_OR_DEPARTMENT_OTHER): Payer: Medicare HMO

## 2019-04-28 ENCOUNTER — Emergency Department (HOSPITAL_BASED_OUTPATIENT_CLINIC_OR_DEPARTMENT_OTHER)
Admission: EM | Admit: 2019-04-28 | Discharge: 2019-04-28 | Disposition: A | Discharge status: Home / Self Care | Payer: Medicare HMO | Attending: Emergency Medicine | Admitting: Emergency Medicine

## 2019-04-28 ENCOUNTER — Encounter (HOSPITAL_BASED_OUTPATIENT_CLINIC_OR_DEPARTMENT_OTHER): Payer: Self-pay | Admitting: Emergency Medicine

## 2019-04-28 DIAGNOSIS — I129 Hypertensive chronic kidney disease with stage 1 through stage 4 chronic kidney disease, or unspecified chronic kidney disease: Secondary | ICD-10-CM | POA: Diagnosis not present

## 2019-04-28 DIAGNOSIS — Z8673 Personal history of transient ischemic attack (TIA), and cerebral infarction without residual deficits: Secondary | ICD-10-CM | POA: Insufficient documentation

## 2019-04-28 DIAGNOSIS — S82832A Other fracture of upper and lower end of left fibula, initial encounter for closed fracture: Secondary | ICD-10-CM | POA: Diagnosis not present

## 2019-04-28 DIAGNOSIS — Z87891 Personal history of nicotine dependence: Secondary | ICD-10-CM | POA: Diagnosis not present

## 2019-04-28 DIAGNOSIS — J45909 Unspecified asthma, uncomplicated: Secondary | ICD-10-CM | POA: Insufficient documentation

## 2019-04-28 DIAGNOSIS — Z7982 Long term (current) use of aspirin: Secondary | ICD-10-CM | POA: Diagnosis not present

## 2019-04-28 DIAGNOSIS — Y939 Activity, unspecified: Secondary | ICD-10-CM | POA: Diagnosis not present

## 2019-04-28 DIAGNOSIS — W010XXA Fall on same level from slipping, tripping and stumbling without subsequent striking against object, initial encounter: Secondary | ICD-10-CM | POA: Insufficient documentation

## 2019-04-28 DIAGNOSIS — Y929 Unspecified place or not applicable: Secondary | ICD-10-CM | POA: Insufficient documentation

## 2019-04-28 DIAGNOSIS — Z794 Long term (current) use of insulin: Secondary | ICD-10-CM | POA: Diagnosis not present

## 2019-04-28 DIAGNOSIS — I252 Old myocardial infarction: Secondary | ICD-10-CM | POA: Diagnosis not present

## 2019-04-28 DIAGNOSIS — Z79899 Other long term (current) drug therapy: Secondary | ICD-10-CM | POA: Insufficient documentation

## 2019-04-28 DIAGNOSIS — Z7902 Long term (current) use of antithrombotics/antiplatelets: Secondary | ICD-10-CM | POA: Insufficient documentation

## 2019-04-28 DIAGNOSIS — Y999 Unspecified external cause status: Secondary | ICD-10-CM | POA: Insufficient documentation

## 2019-04-28 DIAGNOSIS — N183 Chronic kidney disease, stage 3 unspecified: Secondary | ICD-10-CM | POA: Insufficient documentation

## 2019-04-28 DIAGNOSIS — E1122 Type 2 diabetes mellitus with diabetic chronic kidney disease: Secondary | ICD-10-CM | POA: Insufficient documentation

## 2019-04-28 DIAGNOSIS — W19XXXA Unspecified fall, initial encounter: Secondary | ICD-10-CM

## 2019-04-28 DIAGNOSIS — I2510 Atherosclerotic heart disease of native coronary artery without angina pectoris: Secondary | ICD-10-CM | POA: Diagnosis not present

## 2019-04-28 DIAGNOSIS — S8992XA Unspecified injury of left lower leg, initial encounter: Secondary | ICD-10-CM | POA: Diagnosis not present

## 2019-04-28 DIAGNOSIS — M25562 Pain in left knee: Secondary | ICD-10-CM | POA: Diagnosis not present

## 2019-04-28 MED ORDER — IBUPROFEN 600 MG PO TABS: 600.0000 mg | ORAL_TABLET | Freq: Four times a day (QID) | ORAL | 0 refills | Status: DC | PRN

## 2019-04-28 MED ORDER — HYDROCODONE-ACETAMINOPHEN 5-325 MG PO TABS
1.0000 | ORAL_TABLET | Freq: Once | ORAL | Status: AC
  Administered 2019-04-28: 1 via ORAL
  Filled 2019-04-28: qty 1

## 2019-04-28 MED ORDER — HYDROCODONE-ACETAMINOPHEN 5-325 MG PO TABS: 1.0000 | ORAL_TABLET | ORAL | 0 refills | Status: DC | PRN

## 2019-04-28 MED ORDER — IBUPROFEN 800 MG PO TABS
800.0000 mg | ORAL_TABLET | Freq: Once | ORAL | Status: AC
  Administered 2019-04-28: 16:00:00 800 mg via ORAL
  Filled 2019-04-28: qty 1

## 2019-04-28 NOTE — ED Provider Notes (Signed)
[facility] EMERGENCY DEPARTMENT Provider Note   CSN: [id number] Arrival date & time: 04/28/19  1418     History   Chief Complaint Chief Complaint  Patient presents with  . Fall    HPI [name] is a 67 y.o. female.     Pt presents to the ED today with left knee pain.  Pt fell twice in the last few days and landed on her left knee both times.  She denies any other injuries.  She did not hit her head or have a loc.     Past Medical History:  Diagnosis Date  . Asthma   . Degenerative joint disease   . Diabetes mellitus   . Dyspnea   . GERD (gastroesophageal reflux disease)   . Hyperlipidemia    no longer on medication for this  . Hypertension    patient denies  . Myocardial infarction ([place]) 08/2018  . Neuropathy    bilateral hands and feet  . Sickle cell trait ([place])   . Sleep apnea   . stomach ca dx'd 2010   chemo/xrt comp 05/2009  . TIA (transient ischemic attack) 07/2015    Patient Active Problem List   Diagnosis Date Noted  . Obesity with serious comorbidity 04/16/2019  . Gastroenteritis 03/27/2019  . Falls frequently 03/27/2019  . Acute gastroenteritis 03/22/2019  . Hematoma of scalp 03/14/2019  . Vitamin D deficiency 11/09/2018  . Muscle spasm 10/19/2018  . Chest pain 09/07/2018  . Palpitations 09/07/2018  . Other fatigue 09/07/2018  . Acute ST elevation myocardial infarction (STEMI) of inferior wall ([place]) 08/27/2018    Class: Hospitalized for  . Coronary artery disease involving native coronary artery of native heart with unstable angina pectoris ([place]) 08/27/2018    Class: Diagnosis of  . Acute vaginitis 08/20/2018  . Hyperlipidemia associated with type 2 diabetes mellitus ([place]) 04/09/2018  . Bronchopneumonia 11/02/2017  . Sepsis ([place]) 11/02/2017  . Leucocytosis 11/02/2017  . Moderate persistent asthma with acute exacerbation 05/26/2017  . Influenza A 05/26/2017  . Acute pain of right shoulder 03/07/2017  . Acute bilateral low  back pain without sciatica 03/07/2017  . Chronic pain 03/07/2017  . Fatty liver 03/24/2016  . Weakness 03/23/2016  . Unexplained weight loss 03/23/2016  . Melena 03/23/2016  . Hypotension 02/03/2016  . Syncope 02/03/2016  . Hypomagnesemia 02/03/2016  . AKI (acute kidney injury) ([place]) 02/03/2016  . Gastrointestinal dysmotility 01/20/2016  . CKD (chronic kidney disease) stage 3, GFR 30-59 ml/min (HCC) 01/20/2016  . Abscess of right thigh 01/20/2016  . Chronic pain syndrome 01/15/2016  . Diarrhea 01/15/2016  . Generalized abdominal pain 01/14/2016  . Type 2 diabetes with nephropathy ([place]) 12/30/2015  . Morbid obesity due to excess calories ([place]) 05/05/2015  . Diabetic polyneuropathy associated with type 2 diabetes mellitus ([place]) 01/02/2015  . Acute bacterial sinusitis 01/02/2015  . Onychomycosis 11/04/2014  . Pain in lower limb 11/04/2014  . Multinodular goiter (nontoxic) 10/16/2014  . History of CVA (cerebrovascular accident) 08/11/2014  . Iron deficiency anemia 05/20/2014  . Major depressive disorder, single episode, moderate ([place]) 05/20/2014  . Dizziness of unknown cause 04/28/2014  . MDD (major depressive disorder), recurrent episode, moderate ([place]) 01/28/2014  . GERD (gastroesophageal reflux disease) 01/02/2014  . Nausea 10/15/2012  . Stomach cancer ([place]) 12/10/2008  . CHRONIC RESPIRATORY FAILURE 01/03/2008  . Hyperlipidemia 06/27/2007  . OBESITY, MORBID 06/27/2007  . SLEEP APNEA, OBSTRUCTIVE 06/27/2007  . Essential hypertension 06/27/2007  . Asthma 06/27/2007  . OSTEOARTHRITIS  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 06/27/2007    Past Surgical History:  Procedure Laterality Date  . APPENDECTOMY    . CESAREAN SECTION    . COLON SURGERY     colonscopy  . COLONOSCOPY WITH PROPOFOL N/A 06/01/2016   Procedure: COLONOSCOPY WITH PROPOFOL;  Surgeon: Wilford Corner, MD;  Location: [facility] ENDOSCOPY;  Service: Endoscopy;  Laterality: N/A;  . CORONARY STENT INTERVENTION N/A 08/29/2018    Procedure: CORONARY STENT INTERVENTION;  Surgeon: Wellington Hampshire, MD;  Location: [facility] CV LAB;  Service: Cardiovascular;  Laterality: N/A;  . CORONARY/GRAFT ACUTE MI REVASCULARIZATION N/A 08/27/2018   Procedure: Coronary/Graft Acute MI Revascularization;  Surgeon: Leonie Man, MD;  Location: [facility] LAB;  Service: Cardiovascular;  Laterality: N/A;  . ESOPHAGOGASTRODUODENOSCOPY N/A 10/15/2012   Procedure: ESOPHAGOGASTRODUODENOSCOPY (EGD);  Surgeon: Lear Ng, MD;  Location: [facility]OSCOPY;  Service: Endoscopy;  Laterality: N/A;  . ESOPHAGOGASTRODUODENOSCOPY N/A 01/15/2016   Procedure: ESOPHAGOGASTRODUODENOSCOPY (EGD);  Surgeon: Ronald Lobo, MD;  Location: [facility] ENDOSCOPY;  Service: Endoscopy;  Laterality: N/A;  . HERNIA REPAIR    . LEFT HEART CATH AND CORONARY ANGIOGRAPHY N/A 08/27/2018   Procedure: LEFT HEART CATH AND CORONARY ANGIOGRAPHY;  Surgeon: Leonie Man, MD;  Location: [facility] CV LAB;  Service: Cardiovascular;  Laterality: N/A;  . SHOULDER SURGERY     Left  . TOTAL HIP ARTHROPLASTY     Right  . TOTAL KNEE ARTHROPLASTY     Left     OB History    Gravida  4   Para  4   Term      Preterm      AB      Living  4     SAB      TAB      Ectopic      Multiple      Live Births               Home Medications    Prior to Admission medications   Medication Sig Start Date End Date Taking? Authorizing Provider  albuterol (PROVENTIL HFA;VENTOLIN HFA) 108 (90 Base) MCG/ACT inhaler Inhale 2 puffs into the lungs every 6 (six) hours as needed for wheezing or shortness of breath. 11/05/17   Roxan Hockey, MD  albuterol (PROVENTIL) (2.5 MG/3ML) 0.083% nebulizer solution Take 3 mLs (2.5 mg total) by nebulization every 6 (six) hours as needed for wheezing or shortness of breath. 11/05/17   Roxan Hockey, MD  aspirin EC 81 MG tablet Take 1 tablet (81 mg total) by mouth daily. 02/20/19   Ann Held, DO  clopidogrel (PLAVIX) 75 MG  tablet TAKE 4 TABLETS BY MOUTH ON DAY 1 THEN, 1TAB DAILY Patient taking differently: Take 75 mg by mouth daily.  02/06/19   Lendon Colonel, NP  darifenacin (ENABLEX) 7.5 MG 24 hr tablet Take 1 tablet (7.5 mg total) by mouth daily. 04/03/19   Volanda Napoleon, MD  famotidine (PEPCID) 40 MG tablet Take 1 tablet (40 mg total) by mouth daily. 02/12/19   Roma Schanz R, DO  fluticasone (FLONASE) 50 MCG/ACT nasal spray Place 2 sprays into both nostrils daily. Patient taking differently: Place 2 sprays into both nostrils daily as needed for allergies or rhinitis.  04/18/18   Roma Schanz R, DO  gabapentin (NEURONTIN) 800 MG tablet TAKE ONE TABLET BY MOUTH THREE TIMES DAILY ;; 03/06/19   Carollee Herter, Alferd Apa, DO  glimepiride (AMARYL) 4 MG tablet Take 1  tablet (4 mg total) by mouth 2 (two) times daily. 01/24/19   Roma Schanz R, DO  glucose blood (ONETOUCH VERIO) test strip Use as instructed to check blood sugar 3 times a day 09/04/18   Philemon Kingdom, MD  HYDROcodone-acetaminophen (NORCO/VICODIN) 5-325 MG tablet Take 1 tablet by mouth every 4 (four) hours as needed. 04/28/19   Isla Pence, MD  ibuprofen (ADVIL) 600 MG tablet Take 1 tablet (600 mg total) by mouth every 6 (six) hours as needed. 04/28/19   Isla Pence, MD  Insulin Degludec (TRESIBA FLEXTOUCH) 200 UNIT/ML SOPN Inject 20 Units into the skin daily. Patient taking differently: Inject 20 Units into the skin daily as needed (high blood sugar.).  08/31/18   Philemon Kingdom, MD  Insulin Pen Needle (B-D UF III MINI PEN NEEDLES) 31G X 5 MM MISC Use to inject insulin once a day. 01/07/19   Philemon Kingdom, MD  ipratropium-albuterol (DUONEB) 0.5-2.5 (3) MG/3ML SOLN Take 3 mLs by nebulization every 6 (six) hours as needed. Patient taking differently: Take 3 mLs by nebulization every 6 (six) hours as needed (sob and wheezing).  05/26/17   Ann Held, DO  isosorbide mononitrate (IMDUR) 30 MG 24 hr tablet Take 0.5 tablets  (15 mg total) by mouth daily. *NEEDS OFFICE VISIT* 03/29/19   Lendon Colonel, NP  lidocaine-prilocaine (EMLA) cream Apply 1 application topically as needed. Patient taking differently: Apply 1 application topically as needed (to numb port site.).  02/28/19   Volanda Napoleon, MD  metFORMIN (GLUCOPHAGE) 500 MG tablet Take 1 tablet (500 mg total) by mouth 2 (two) times daily with a meal. 02/13/19   Philemon Kingdom, MD  methocarbamol (ROBAXIN) 500 MG tablet Take 1 tablet (500 mg total) by mouth every 6 (six) hours as needed for muscle spasms. 10/19/18   Ann Held, DO  metoprolol tartrate (LOPRESSOR) 25 MG tablet Take 1 tablet (25 mg total) by mouth 2 (two) times daily. 01/14/19   Lorretta Harp, MD  Multiple Vitamins-Minerals (CENTRUM SILVER ADULT 50+) TABS Take 1 tablet once a day 02/20/19   Carollee Herter, Alferd Apa, DO  nitroGLYCERIN (NITROSTAT) 0.4 MG SL tablet Place 1 tablet (0.4 mg total) under the tongue every 5 (five) minutes as needed. 01/14/19   Lorretta Harp, MD  ondansetron (ZOFRAN) 4 MG tablet TAKE ONE TABLET BY MOUTH EVERY EIGHT HOURS AS NEEDED FOR NAUSEA AND VOMITING 03/01/19   Volanda Napoleon, MD  pantoprazole (PROTONIX) 40 MG tablet Take 1 tablet (40 mg total) by mouth daily. 01/02/19   Roma Schanz R, DO  rosuvastatin (CRESTOR) 40 MG tablet TAKE ONE TABLET BY MOUTH EVERY DAY AT BEDTIME 03/01/19   Leonie Man, MD  Semaglutide,0.25 or 0.5MG/DOS, (OZEMPIC, 0.25 OR 0.5 MG/DOSE,) 2 MG/1.5ML SOPN Inject 0.5 mg into the skin once a week. 04/16/19   Philemon Kingdom, MD  sertraline (ZOLOFT) 50 MG tablet Take 1 tablet (50 mg total) by mouth daily. 02/20/19   Ann Held, DO  simethicone (MYLICON) 80 MG chewable tablet Chew 1 tablet (80 mg total) by mouth 4 (four) times daily as needed for flatulence. 03/24/19   Pokhrel, Corrie Mckusick, MD  sucralfate (CARAFATE) 1 g tablet TAKE 1 TABLET BY MOUTH TWICE DAILY 02/05/19   [place], Holli Humbles, NP  traMADol (ULTRAM) 50 MG tablet  TAKE TWO TABLETS BY MOUTH THREE TIMES DAILY AS NEEDED FOR MODERATE PAIN 04/05/19   Ann Held, DO    Family History Family  History  Problem Relation Age of Onset  . Diabetes Brother   . Alzheimer's disease Father 57  . Cancer Mother 67  . Diabetes Maternal Grandmother   . Cancer Maternal Grandfather        lung  . Suicidality Neg Hx   . Depression Neg Hx   . Dementia Neg Hx   . Anxiety disorder Neg Hx     Social History Social History   Tobacco Use  . Smoking status: Former Smoker    Packs/day: 1.00    Years: 15.00    Pack years: 15.00    Types: Cigarettes    Start date: 09/27/1973    Quit date: 10/15/1988    Years since quitting: 30.5  . Smokeless tobacco: Never Used  . Tobacco comment: quit smoking 14 years ago  Substance Use Topics  . Alcohol use: No    Alcohol/week: 0.0 standard drinks  . Drug use: No     Allergies   Adhesive [tape], Codeine, Zocor [simvastatin - high dose], and Penicillins   Review of Systems Review of Systems  Musculoskeletal:       Left knee pain  All other systems reviewed and are negative.    Physical Exam Updated Vital Signs BP 117/78 (BP Location: Right Arm)   Pulse (!) 107   Temp 98.2 F (36.8 C) (Oral)   Resp 18   Ht 5' 6"  (1.676 m)   Wt 96.6 kg   SpO2 98%   BMI 34.38 kg/m   Physical Exam Vitals signs and nursing note reviewed.  Constitutional:      Appearance: Normal appearance.  HENT:     Head: Normocephalic and atraumatic.     Right Ear: External ear normal.     Left Ear: External ear normal.     Nose: Nose normal.     Mouth/Throat:     Mouth: Mucous membranes are moist.     Pharynx: Oropharynx is clear.  Eyes:     Extraocular Movements: Extraocular movements intact.     Conjunctiva/sclera: Conjunctivae normal.     Pupils: Pupils are equal, round, and reactive to light.  Neck:     Musculoskeletal: Normal range of motion and neck supple.  Cardiovascular:     Rate and Rhythm: Normal rate and  regular rhythm.     Pulses: Normal pulses.     Heart sounds: Normal heart sounds.  Pulmonary:     Effort: Pulmonary effort is normal.     Breath sounds: Normal breath sounds.  Abdominal:     General: Abdomen is flat. Bowel sounds are normal.     Palpations: Abdomen is soft.  Musculoskeletal:       Legs:  Skin:    General: Skin is warm.     Capillary Refill: Capillary refill takes less than 2 seconds.  Neurological:     General: No focal deficit present.     Mental Status: She is alert and oriented to person, place, and time.  Psychiatric:        Mood and Affect: Mood normal.        Behavior: Behavior normal.      ED Treatments / Results  Labs (all labs ordered are listed, but only abnormal results are displayed) Labs Reviewed - No data to display  EKG None  Radiology Dg Knee Complete 4 Views Left  Result Date: 04/28/2019 CLINICAL DATA:  Fall, left knee pain. EXAM: LEFT KNEE - COMPLETE 4+ VIEW COMPARISON:  03/07/2019 FINDINGS: No periprosthetic fracture in the  femur or tibia. There is some mild cortical discontinuity in the proximal fibular metaphysis which appears increased on the frontal projection compared 03/07/2019 suspicious for a nondisplaced proximal fibular fracture. Expected postoperative appearance of the patella. IMPRESSION: 1. Subtle cortical discontinuity along the proximal fibular metaphysis suspicious for a nondisplaced fracture on the frontal projection. 2. No tibial or femoral fracture, or specific complicating feature of the prosthetic identified. Electronically Signed   By: Van Clines M.D.   On: 04/28/2019 15:24    Procedures Procedures (including critical care time)  Medications Ordered in ED Medications  HYDROcodone-acetaminophen (NORCO/VICODIN) 5-325 MG per tablet 1 tablet (has no administration in time range)  ibuprofen (ADVIL) tablet 800 mg (has no administration in time range)     Initial Impression / Assessment and Plan / ED Course   I have reviewed the triage vital signs and the nursing notes.  Pertinent labs & imaging results that were available during my care of the patient were reviewed by me and considered in my medical decision making (see chart for details).        Pt placed in a knee immobilizer.  Dr. Sharol Given is her orthopedist.  She is instructed to f/u with him.  She knows to return if worse.  Final Clinical Impressions(s) / ED Diagnoses   Final diagnoses:  Fall, initial encounter  Closed fracture of proximal end of left fibula, unspecified fracture morphology, initial encounter    ED Discharge Orders         Ordered    HYDROcodone-acetaminophen (NORCO/VICODIN) 5-325 MG tablet  Every 4 hours PRN     04/28/19 1549    ibuprofen (ADVIL) 600 MG tablet  Every 6 hours PRN     04/28/19 1549           Isla Pence, MD 04/28/19 1551

## 2019-04-28 NOTE — ED Triage Notes (Addendum)
Pt slipped and fell today on a wet floor injuring L knee. Denies hitting head or LOC. Also slipped in the bath tub yesterday.

## 2019-04-28 NOTE — ED Notes (Signed)
Assisted patient in getting dressed and transferred to wheelchair. Patient moved to waiting room to wait on daughter to be pick up.

## 2019-05-02 ENCOUNTER — Ambulatory Visit (INDEPENDENT_AMBULATORY_CARE_PROVIDER_SITE_OTHER): Payer: Medicare HMO | Admitting: Orthopedic Surgery

## 2019-05-02 VITALS — Ht 66.0 in | Wt 213.0 lb

## 2019-05-02 DIAGNOSIS — E669 Obesity, unspecified: Secondary | ICD-10-CM | POA: Diagnosis not present

## 2019-05-02 DIAGNOSIS — M25562 Pain in left knee: Secondary | ICD-10-CM | POA: Diagnosis not present

## 2019-05-03 NOTE — Progress Notes (Signed)
Office Visit Note   Patient: [name]           Date of Birth: 1951/01/16           MRN: [id number] Visit Date: 05/02/2019              Requested by: [address], [place], [place] [address] [place],  [place] [address] PCP: Carollee Herter, Alferd Apa, DO  Chief Complaint  Patient presents with  . Left Knee - Pain      HPI: Patient is a 68 year old woman who states she fell last week has pain in her left knee status post left total knee arthroplasty.  Patient states she was told she had a fracture of the knee.  She states that she is not able to bear weight on her left lower extremity.  Patient also has recurrent pain in the left shoulder she is status post arthroscopic debridement she states she cannot move her shoulder at this time.  Patient complains of global pain around her left knee patient also complains of arthritic pain in her right knee.  Assessment & Plan: Visit Diagnoses:  1. Obesity with serious comorbidity, unspecified classification, unspecified obesity type     Plan: Discussed with patient option of proceeding with physical therapy she states she does not want it try physical therapy.  Patient does have a small crack in the fibular neck this can be managed symptomatically without intervention.  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  She states she will call when she is ready to proceed with surgery.  Follow-Up Instructions: Return if symptoms worsen or fail to improve.   Ortho Exam  Patient is alert, oriented, no adenopathy, well-dressed, normal affect, normal respiratory effort. On examination patient has no effusion there is no extensor lag collateral ligaments are stable she does have an abrasion over the tibial tubercle from the fall there is no full-thickness skin breakdown.  Examination of the left shoulder she has passive abduction flexion to only  70 degrees with adhesive capsulitis she has internal rotation of 10 degrees external rotation of 0 degrees.  Patient has no focal pain around the left knee.  Review the radiographs shows no loosening of the hardware of the patella is midline there is a very small cortical crack in the fibular neck.  Imaging: No results found. No images are attached to the encounter.  Labs: Lab Results  Component Value Date   HGBA1C 9.1 (H) 04/03/2019   HGBA1C 9.2 (H) 03/23/2019   HGBA1C 13.0 (H) 08/28/2018   ESRSEDRATE 57 (H) 07/06/2017   ESRSEDRATE 15 12/09/2013   CRP 2.8 07/06/2017   REPTSTATUS 03/23/2019 FINAL 03/22/2019   CULT  03/22/2019    NO GROWTH Performed at [facility], [place] [address], [place], [place] [address]    LABORGA NO GROWTH 06/21/2016     Lab Results  Component Value Date   ALBUMIN 3.1 (L) 04/03/2019   ALBUMIN 2.3 (L) 03/23/2019   ALBUMIN 2.7 (L) 03/22/2019   PREALBUMIN 10.3 (L) 01/15/2016   PREALBUMIN 26.3 12/03/2008    Lab Results  Component Value Date   MG 1.6 (L) 03/24/2019   MG 1.2 (L) 02/03/2016   MG 1.5 10/12/2012   Lab Results  Component Value Date   VD25OH 17 (L) 06/29/2012   VD25OH 34 06/25/2010   VD25OH <10 (L) 11/13/2009    Lab Results  Component Value Date   PREALBUMIN 10.3 (L) 01/15/2016   PREALBUMIN 26.3 12/03/2008   CBC EXTENDED Latest Ref Rng & Units 04/03/2019 03/24/2019 03/23/2019  WBC 4.0 - 10.5 K/uL 7.4 7.2 7.7  RBC 3.87 - 5.11 MIL/uL 4.14 3.68(L) 3.90  HGB 12.0 - 15.0 g/dL 11.6(L) 10.4(L) 11.0(L)  HCT 36.0 - 46.0 % 35.2(L) 32.1(L) 33.4(L)  PLT 150 - 400 K/uL 339 271 306  NEUTROABS 1.7 - 7.7 K/uL 3.5 - -  LYMPHSABS 0.7 - 4.0 K/uL 2.7 - -     Body mass index is 34.38 kg/m.  Orders:  No orders of the defined types were placed in this encounter.  No orders of the defined types were placed in this encounter.    Procedures: No procedures performed  Clinical Data: No additional findings.  ROS:  All other systems  negative, except as noted in the HPI. Review of Systems  Objective: Vital Signs: Ht 5' 6"  (1.676 m)   Wt 213 lb (96.6 kg)   BMI 34.38 kg/m   Specialty Comments:  No specialty comments available.  PMFS History: Patient Active Problem List   Diagnosis Date Noted  . Obesity with serious comorbidity 04/16/2019  . Gastroenteritis 03/27/2019  . Falls frequently 03/27/2019  . Acute gastroenteritis 03/22/2019  . Hematoma of scalp 03/14/2019  . Vitamin D deficiency 11/09/2018  . Muscle spasm 10/19/2018  . Chest pain 09/07/2018  . Palpitations 09/07/2018  . Other fatigue 09/07/2018  . Acute ST elevation myocardial infarction (STEMI) of inferior wall ([place]) 08/27/2018    Class: Hospitalized for  . Coronary artery disease involving native coronary artery of native heart with unstable angina pectoris ([place]) 08/27/2018    Class: Diagnosis of  . Acute vaginitis 08/20/2018  . Hyperlipidemia associated with type 2 diabetes mellitus ([place]) 04/09/2018  . Bronchopneumonia 11/02/2017  . Sepsis ([place]) 11/02/2017  . Leucocytosis 11/02/2017  . Moderate persistent asthma with acute exacerbation 05/26/2017  . Influenza A 05/26/2017  . Acute pain of right shoulder 03/07/2017  . Acute bilateral low back pain without sciatica 03/07/2017  . Chronic pain 03/07/2017  . Fatty liver 03/24/2016  . Weakness 03/23/2016  . Unexplained weight loss 03/23/2016  . Melena 03/23/2016  . Hypotension 02/03/2016  . Syncope 02/03/2016  . Hypomagnesemia 02/03/2016  . AKI (acute kidney injury) ([place]) 02/03/2016  . Gastrointestinal dysmotility 01/20/2016  . CKD (chronic kidney disease) stage 3, GFR 30-59 ml/min (HCC) 01/20/2016  . Abscess of right thigh 01/20/2016  . Chronic pain syndrome 01/15/2016  . Diarrhea 01/15/2016  . Generalized abdominal pain 01/14/2016  . Type 2 diabetes with nephropathy ([place]) 12/30/2015  . Morbid obesity due to excess calories ([place]) 05/05/2015  . Diabetic polyneuropathy associated with  type 2 diabetes mellitus ([place]) 01/02/2015  . Acute bacterial sinusitis 01/02/2015  . Onychomycosis 11/04/2014  . Pain in lower limb 11/04/2014  . Multinodular goiter (nontoxic) 10/16/2014  . History of CVA (cerebrovascular accident) 08/11/2014  . Iron deficiency anemia 05/20/2014  . Major depressive disorder, single episode, moderate ([place]) 05/20/2014  . Dizziness of unknown cause 04/28/2014  . MDD (major depressive disorder), recurrent episode, moderate ([place]) 01/28/2014  . GERD (gastroesophageal reflux disease) 01/02/2014  . Nausea 10/15/2012  . Stomach cancer ([place]) 12/10/2008  . CHRONIC RESPIRATORY FAILURE 01/03/2008  . Hyperlipidemia 06/27/2007  . OBESITY, MORBID 06/27/2007  . SLEEP APNEA, OBSTRUCTIVE 06/27/2007  . Essential hypertension 06/27/2007  . Asthma 06/27/2007  . OSTEOARTHRITIS  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 06/27/2007   Past Medical History:  Diagnosis Date  . Asthma   . Degenerative joint disease   . Diabetes mellitus   . Dyspnea   . GERD (gastroesophageal reflux disease)   . Hyperlipidemia    no longer on medication for this  . Hypertension    patient denies  . Myocardial infarction ([place]) 08/2018  . Neuropathy    bilateral hands and feet  . Sickle cell trait ([place])   . Sleep apnea   . stomach ca dx'd 2010   chemo/xrt comp 05/2009  . TIA (transient ischemic attack) 07/2015    Family History  Problem Relation Age of Onset  . Diabetes Brother   . Alzheimer's disease Father 3  . Cancer Mother 67  . Diabetes Maternal Grandmother   . Cancer Maternal Grandfather        lung  . Suicidality Neg Hx   . Depression Neg Hx   . Dementia Neg Hx   . Anxiety disorder Neg Hx     Past Surgical History:  Procedure Laterality Date  . APPENDECTOMY    . CESAREAN SECTION    . COLON SURGERY     colonscopy  . COLONOSCOPY WITH PROPOFOL N/A 06/01/2016   Procedure: COLONOSCOPY WITH PROPOFOL;  Surgeon: Wilford Corner, MD;  Location: [facility] ENDOSCOPY;  Service: Endoscopy;   Laterality: N/A;  . CORONARY STENT INTERVENTION N/A 08/29/2018   Procedure: CORONARY STENT INTERVENTION;  Surgeon: Wellington Hampshire, MD;  Location: [facility] CV LAB;  Service: Cardiovascular;  Laterality: N/A;  . CORONARY/GRAFT ACUTE MI REVASCULARIZATION N/A 08/27/2018   Procedure: Coronary/Graft Acute MI Revascularization;  Surgeon: Leonie Man, MD;  Location: [facility] LAB;  Service: Cardiovascular;  Laterality: N/A;  . ESOPHAGOGASTRODUODENOSCOPY N/A 10/15/2012   Procedure: ESOPHAGOGASTRODUODENOSCOPY (EGD);  Surgeon: Lear Ng, MD;  Location: [facility]OSCOPY;  Service: Endoscopy;  Laterality: N/A;  . ESOPHAGOGASTRODUODENOSCOPY N/A 01/15/2016   Procedure: ESOPHAGOGASTRODUODENOSCOPY (EGD);  Surgeon: Ronald Lobo, MD;  Location: [facility]OSCOPY;  Service: Endoscopy;  Laterality: N/A;  . HERNIA REPAIR    . LEFT HEART CATH AND CORONARY ANGIOGRAPHY N/A 08/27/2018   Procedure: LEFT HEART CATH AND CORONARY ANGIOGRAPHY;  Surgeon: Leonie Man, MD;  Location: [facility] LAB;  Service: Cardiovascular;  Laterality: N/A;  . SHOULDER SURGERY     Left  . TOTAL HIP ARTHROPLASTY     Right  . TOTAL KNEE ARTHROPLASTY     Left   Social History   Occupational History  . Not on file  Tobacco Use  . Smoking status: Former Smoker    Packs/day: 1.00    Years: 15.00    Pack years: 15.00    Types: Cigarettes    Start date: 09/27/1973    Quit date: 10/15/1988    Years since quitting: 30.5  . Smokeless tobacco: Never Used  . Tobacco comment: quit smoking 14 years ago  Substance and Sexual Activity  . Alcohol use: No    Alcohol/week: 0.0 standard drinks  . Drug use: No  . Sexual activity: Not on file

## 2019-05-06 NOTE — Progress Notes (Signed)
NEUROLOGY CONSULTATION NOTE  [name] MRN: [id number] DOB: 02/15/1951  Referring provider: Roma Schanz, DO Primary care provider: Roma Schanz, DO  Reason for consult:  falls  HISTORY OF PRESENT ILLNESS: [name] is a 68 year old right-handed woman with type II diabetes mellitus with neuropathy, CKD stage 3, major depressive disorder, hypertension, and stomach cancer and history of brainstem infarct who presents for frequent falls.  She has history of recurrent vertigo, episodes lasting a few seconds.  She has had falls with the vertigo.  She had an MRI of the brain with and without contrast performed on 05/03/14, which showed subacute infarcts in the pons but no metastatic disease. She started ASA 21m daily.  Symptoms persisted, so she had another MRI of the brain with and without contrast performed on 08/09/14, which revealed no new infarcts. MRA of head from 10/11/14 showed no vertebro-basilar stenosis.  She also has longstanding history of diabetic neuropathy.  Diabetes has been uncontrolled.    She has had about 4 falls since August.  She was evaluated in the ED on 03/07/2019 following a fall in which she hit her head.  At that time, she didn't remember why she fell.  She did not lose consciousness.  CT of head personally reviewed showed scalp contusion but no acute intracranial abnormality.  CT cervical spine showed loss of disc height at C6-7 but no acute fracture or malalignment.  One time, she slipped on the wet bathroom floor.  Another time at the laundry mat, she was sitting in her sit-down walker when she scooted back in the seat and the wheel got caught, causing her to fall off.  Another time, she was sitting in the shower seat and leaned forward to turn the water off when she slipped off the seat and fell.  She has landed on her left knee a couple of times.  She was found to have closed fracture of the proximal left fibula.  She is status post left total knee  arthroplasty.  She has reported arthritis in her right knee.  She is followed by Dr. DSharol Givenof orthopedics.  Her daughter states that when she walks, she tends to not lift her legs.  She denies low back pain or pain radiating down her legs.  Current medications:  Gabapentin 8034mthree times daily; hydrocodone-acetaminophen; lidocaine-prilocaine cream; tramadol 5062msertraline 46m25mily; Robaxin  Past medications:  Cymbalta.  Lyrica with cardiac contraindications.  Last Hgb A1c from 04/03/2019 was 9.1.  PAST MEDICAL HISTORY: Past Medical History:  Diagnosis Date  . Asthma   . Degenerative joint disease   . Diabetes mellitus   . Dyspnea   . GERD (gastroesophageal reflux disease)   . Hyperlipidemia    no longer on medication for this  . Hypertension    patient denies  . Myocardial infarction (HCC)[place]/2020  . Neuropathy    bilateral hands and feet  . Sickle cell trait (HCC)[place]. Sleep apnea   . stomach ca dx'd 2010   chemo/xrt comp 05/2009  . TIA (transient ischemic attack) 07/2015    PAST SURGICAL HISTORY: Past Surgical History:  Procedure Laterality Date  . APPENDECTOMY    . CESAREAN SECTION    . COLON SURGERY     colonscopy  . COLONOSCOPY WITH PROPOFOL N/A 06/01/2016   Procedure: COLONOSCOPY WITH PROPOFOL;  Surgeon: VincWilford Corner;  [facility];  Service: Endoscopy;  Laterality: N/A;  . CORONARY STENT INTERVENTION N/A 08/29/2018  Procedure: CORONARY STENT INTERVENTION;  Surgeon: Wellington Hampshire, MD;  Location: [place] [facility] LAB;  Service: Cardiovascular;  Laterality: N/A;  . CORONARY/GRAFT ACUTE MI REVASCULARIZATION N/A 08/27/2018   Procedure: Coronary/Graft Acute MI Revascularization;  Surgeon: Leonie Man, MD;  Location: [place] [facility];  Service: Cardiovascular;  Laterality: N/A;  . ESOPHAGOGASTRODUODENOSCOPY N/A 10/15/2012   Procedure: ESOPHAGOGASTRODUODENOSCOPY (EGD);  Surgeon: Lear Ng, MD;  Location: [facility];  Service: Endoscopy;   Laterality: N/A;  . ESOPHAGOGASTRODUODENOSCOPY N/A 01/15/2016   Procedure: ESOPHAGOGASTRODUODENOSCOPY (EGD);  Surgeon: Ronald Lobo, MD;  Location: [facility];  Service: Endoscopy;  Laterality: N/A;  . HERNIA REPAIR    . LEFT HEART CATH AND CORONARY ANGIOGRAPHY N/A 08/27/2018   Procedure: LEFT HEART CATH AND CORONARY ANGIOGRAPHY;  Surgeon: Leonie Man, MD;  Location: [place] [facility];  Service: Cardiovascular;  Laterality: N/A;  . SHOULDER SURGERY     Left  . TOTAL HIP ARTHROPLASTY     Right  . TOTAL KNEE ARTHROPLASTY     Left    MEDICATIONS: Current Outpatient Medications on File Prior to Visit  Medication Sig Dispense Refill  . albuterol (PROVENTIL HFA;VENTOLIN HFA) 108 (90 Base) MCG/ACT inhaler Inhale 2 puffs into the lungs every 6 (six) hours as needed for wheezing or shortness of breath. 1 Inhaler 2  . albuterol (PROVENTIL) (2.5 MG/3ML) 0.083% nebulizer solution Take 3 mLs (2.5 mg total) by nebulization every 6 (six) hours as needed for wheezing or shortness of breath. 75 mL 12  . aspirin EC 81 MG tablet Take 1 tablet (81 mg total) by mouth daily. 90 tablet 3  . clopidogrel (PLAVIX) 75 MG tablet TAKE 4 TABLETS BY MOUTH ON DAY 1 THEN, 1TAB DAILY (Patient taking differently: Take 75 mg by mouth daily. ) 30 tablet 6  . darifenacin (ENABLEX) 7.5 MG 24 hr tablet Take 1 tablet (7.5 mg total) by mouth daily. 30 tablet 2  . famotidine (PEPCID) 40 MG tablet Take 1 tablet (40 mg total) by mouth daily. 30 tablet 3  . fluticasone (FLONASE) 50 MCG/ACT nasal spray Place 2 sprays into both nostrils daily. (Patient taking differently: Place 2 sprays into both nostrils daily as needed for allergies or rhinitis. ) 16 g 5  . gabapentin (NEURONTIN) 800 MG tablet TAKE ONE TABLET BY MOUTH THREE TIMES DAILY ;; 90 tablet 2  . glimepiride (AMARYL) 4 MG tablet Take 1 tablet (4 mg total) by mouth 2 (two) times daily. 180 tablet 1  . glucose blood (ONETOUCH VERIO) test strip Use as instructed to check  blood sugar 3 times a day 300 each 12  . HYDROcodone-acetaminophen (NORCO/VICODIN) 5-325 MG tablet Take 1 tablet by mouth every 4 (four) hours as needed. 10 tablet 0  . ibuprofen (ADVIL) 600 MG tablet Take 1 tablet (600 mg total) by mouth every 6 (six) hours as needed. 30 tablet 0  . Insulin Degludec (TRESIBA FLEXTOUCH) 200 UNIT/ML SOPN Inject 20 Units into the skin daily. (Patient taking differently: Inject 20 Units into the skin daily as needed (high blood sugar.). ) 3 pen 3  . Insulin Pen Needle (B-D UF III MINI PEN NEEDLES) 31G X 5 MM MISC Use to inject insulin once a day. 100 each 11  . ipratropium-albuterol (DUONEB) 0.5-2.5 (3) MG/3ML SOLN Take 3 mLs by nebulization every 6 (six) hours as needed. (Patient taking differently: Take 3 mLs by nebulization every 6 (six) hours as needed (sob and wheezing). ) 360 mL 2  . isosorbide mononitrate (IMDUR)  30 MG 24 hr tablet Take 0.5 tablets (15 mg total) by mouth daily. *NEEDS OFFICE VISIT* 15 tablet 0  . lidocaine-prilocaine (EMLA) cream Apply 1 application topically as needed. (Patient taking differently: Apply 1 application topically as needed (to numb port site.). ) 30 g 0  . metFORMIN (GLUCOPHAGE) 500 MG tablet Take 1 tablet (500 mg total) by mouth 2 (two) times daily with a meal. 180 tablet 3  . methocarbamol (ROBAXIN) 500 MG tablet Take 1 tablet (500 mg total) by mouth every 6 (six) hours as needed for muscle spasms. 45 tablet 0  . metoprolol tartrate (LOPRESSOR) 25 MG tablet Take 1 tablet (25 mg total) by mouth 2 (two) times daily. 180 tablet 2  . Multiple Vitamins-Minerals (CENTRUM SILVER ADULT 50+) TABS Take 1 tablet once a day 90 tablet 1  . nitroGLYCERIN (NITROSTAT) 0.4 MG SL tablet Place 1 tablet (0.4 mg total) under the tongue every 5 (five) minutes as needed. 25 tablet 3  . ondansetron (ZOFRAN) 4 MG tablet TAKE ONE TABLET BY MOUTH EVERY EIGHT HOURS AS NEEDED FOR NAUSEA AND VOMITING 15 tablet 2  . pantoprazole (PROTONIX) 40 MG tablet Take 1  tablet (40 mg total) by mouth daily. 90 tablet 1  . rosuvastatin (CRESTOR) 40 MG tablet TAKE ONE TABLET BY MOUTH EVERY DAY AT BEDTIME 180 tablet 1  . Semaglutide,0.25 or 0.5MG/DOS, (OZEMPIC, 0.25 OR 0.5 MG/DOSE,) 2 MG/1.5ML SOPN Inject 0.5 mg into the skin once a week. 2 pen 5  . sertraline (ZOLOFT) 50 MG tablet Take 1 tablet (50 mg total) by mouth daily. 90 tablet 1  . simethicone (MYLICON) 80 MG chewable tablet Chew 1 tablet (80 mg total) by mouth 4 (four) times daily as needed for flatulence. 30 tablet 0  . sucralfate (CARAFATE) 1 g tablet TAKE 1 TABLET BY MOUTH TWICE DAILY 60 tablet 10  . traMADol (ULTRAM) 50 MG tablet TAKE TWO TABLETS BY MOUTH THREE TIMES DAILY AS NEEDED FOR MODERATE PAIN 180 tablet 4   Current Facility-Administered Medications on File Prior to Visit  Medication Dose Route Frequency Provider Last Rate Last Dose  . sodium chloride flush (NS) 0.9 % injection 10 mL  10 mL Intravenous PRN [place], Holli Humbles, NP   10 mL at 09/13/16 1146    ALLERGIES: Allergies  Allergen Reactions  . Adhesive [Tape] Other (See Comments)    Burn Skin  . Codeine Other (See Comments)    paranoid  . Zocor [Simvastatin - High Dose] Other (See Comments)    Muscle spasms  . Penicillins Rash    Has patient had a PCN reaction causing immediate rash, facial/tongue/throat swelling, SOB or lightheadedness with hypotension: Yes Has patient had a PCN reaction causing severe rash involving mucus membranes or skin necrosis: No Has patient had a PCN reaction that required hospitalization does not remember.  Has patient had a PCN reaction occurring within the last 10 years: No If all of the above answers are "NO", then may proceed with Cephalosporin use.     FAMILY HISTORY: Family History  Problem Relation Age of Onset  . Diabetes Brother   . Alzheimer's disease Father 65  . Cancer Mother 88  . Diabetes Maternal Grandmother   . Cancer Maternal Grandfather        lung  . Suicidality Neg Hx   .  Depression Neg Hx   . Dementia Neg Hx   . Anxiety disorder Neg Hx    SOCIAL HISTORY: Social History   Socioeconomic History  .  Marital status: Divorced    Spouse name: Not on file  . Number of children: Not on file  . Years of education: Not on file  . Highest education level: Not on file  Occupational History  . Not on file  Social Needs  . Financial resource strain: Not on file  . Food insecurity    Worry: Not on file    Inability: Not on file  . Transportation needs    Medical: Not on file    Non-medical: Not on file  Tobacco Use  . Smoking status: Former Smoker    Packs/day: 1.00    Years: 15.00    Pack years: 15.00    Types: Cigarettes    Start date: 09/27/1973    Quit date: 10/15/1988    Years since quitting: 30.5  . Smokeless tobacco: Never Used  . Tobacco comment: quit smoking 14 years ago  Substance and Sexual Activity  . Alcohol use: No    Alcohol/week: 0.0 standard drinks  . Drug use: No  . Sexual activity: Not on file  Lifestyle  . Physical activity    Days per week: Not on file    Minutes per session: Not on file  . Stress: Not on file  Relationships  . Social Herbalist on phone: Not on file    Gets together: Not on file    Attends religious service: Not on file    Active member of club or organization: Not on file    Attends meetings of clubs or organizations: Not on file    Relationship status: Not on file  . Intimate partner violence    Fear of current or ex partner: Not on file    Emotionally abused: Not on file    Physically abused: Not on file    Forced sexual activity: Not on file  Other Topics Concern  . Not on file  Social History Narrative  . Not on file    REVIEW OF SYSTEMS: Constitutional: No fevers, chills, or sweats, no generalized fatigue, change in appetite Eyes: No visual changes, double vision, eye pain Ear, nose and throat: No hearing loss, ear pain, nasal congestion, sore throat Cardiovascular: No chest pain,  palpitations Respiratory:  No shortness of breath at rest or with exertion, wheezes GastrointestinaI: No nausea, vomiting, diarrhea, abdominal pain, fecal incontinence Genitourinary:  No dysuria, urinary retention or frequency Musculoskeletal: left knee pain, right knee pain, right shoulder pain Integumentary: No rash, pruritus, skin lesions Neurological: as above Psychiatric: No depression, insomnia, anxiety Endocrine: No palpitations, fatigue, diaphoresis, mood swings, change in appetite, change in weight, increased thirst Hematologic/Lymphatic:  No purpura, petechiae. Allergic/Immunologic: no itchy/runny eyes, nasal congestion, recent allergic reactions, rashes  PHYSICAL EXAM: Blood pressure (!) 100/56, pulse 71, height 5' 6"  (1.676 m), weight 216 lb 6.4 oz (98.2 kg), SpO2 98 %. General: No acute distress.  Patient appears well-groomed.   Head:  Normocephalic/atraumatic Eyes:  fundi examined but not visualized Neck: supple, no paraspinal tenderness, full range of motion Back: No paraspinal tenderness Heart: regular rate and rhythm Lungs: Clear to auscultation bilaterally. Vascular: No carotid bruits. Neurological Exam: Mental status: alert and oriented to person, place, and time, recent and remote memory intact, fund of knowledge intact, attention and concentration intact, speech fluent and not dysarthric, language intact. Cranial nerves: CN I: not tested CN II: pupils equal, round and reactive to light, visual fields intact CN III, IV, VI:  full range of motion, no nystagmus, no ptosis CN  V: facial sensation intact CN VII: upper and lower face symmetric CN VIII: hearing intact CN IX, X: gag intact, uvula midline CN XI: sternocleidomastoid and trapezius muscles intact CN XII: tongue midline Bulk & Tone: normal, no fasciculations. Motor:  5/5 throughout except 4+/5 left hip flexion and knee extension/flexion. Sensation:  Pinprick and vibration sensation reduced in feet. Deep  Tendon Reflexes:  1+ upper extremities, absent lower extremities,  toes downgoing.   Finger to nose testing:  Without dysmetria.   Heel to shin:  Without dysmetria.   Gait:  Antalgic gait.  Romberg with sway.  IMPRESSION: 1.  Frequent falls.  These recent falls are not associated with vertigo.  Some of her falls were mechanical (slipped on wet floor, fell out of the seat).  I think her balance issues are secondary to her diabetic neuropathy and arthritis.  She does not endorse dizziness/syncope, symptoms of lumbar stenosis, or exhibit signs or myelopathy (such as due to cervical spinal stenosis).    I really do not think further workup is warranted at this time.  Thank you for allowing me to take part in the care of this patient.  Metta Clines, DO  CC:  Roma Schanz, DO

## 2019-05-08 ENCOUNTER — Other Ambulatory Visit: Payer: Self-pay

## 2019-05-08 ENCOUNTER — Ambulatory Visit: Payer: Medicare HMO | Admitting: Neurology

## 2019-05-08 ENCOUNTER — Encounter: Payer: Self-pay | Admitting: Neurology

## 2019-05-08 VITALS — BP 100/56 | HR 71 | Ht 66.0 in | Wt 216.4 lb

## 2019-05-08 DIAGNOSIS — R296 Repeated falls: Secondary | ICD-10-CM

## 2019-05-08 DIAGNOSIS — E1142 Type 2 diabetes mellitus with diabetic polyneuropathy: Secondary | ICD-10-CM

## 2019-05-08 NOTE — Patient Instructions (Signed)
No further workup needed at this time

## 2019-05-12 ENCOUNTER — Other Ambulatory Visit: Payer: Self-pay

## 2019-05-12 ENCOUNTER — Emergency Department (HOSPITAL_COMMUNITY): Payer: Medicare HMO

## 2019-05-12 ENCOUNTER — Encounter (HOSPITAL_COMMUNITY): Payer: Self-pay

## 2019-05-12 ENCOUNTER — Inpatient Hospital Stay (HOSPITAL_COMMUNITY)
Admission: EM | Admit: 2019-05-12 | Discharge: 2019-05-16 | DRG: 281 | Disposition: A | Discharge status: Home / Self Care | Payer: Medicare HMO | Attending: Internal Medicine | Admitting: Internal Medicine

## 2019-05-12 DIAGNOSIS — D709 Neutropenia, unspecified: Secondary | ICD-10-CM | POA: Diagnosis present

## 2019-05-12 DIAGNOSIS — I214 Non-ST elevation (NSTEMI) myocardial infarction: Principal | ICD-10-CM | POA: Diagnosis present

## 2019-05-12 DIAGNOSIS — E785 Hyperlipidemia, unspecified: Secondary | ICD-10-CM | POA: Diagnosis not present

## 2019-05-12 DIAGNOSIS — Z91048 Other nonmedicinal substance allergy status: Secondary | ICD-10-CM

## 2019-05-12 DIAGNOSIS — R6510 Systemic inflammatory response syndrome (SIRS) of non-infectious origin without acute organ dysfunction: Secondary | ICD-10-CM | POA: Diagnosis not present

## 2019-05-12 DIAGNOSIS — Z96652 Presence of left artificial knee joint: Secondary | ICD-10-CM | POA: Diagnosis present

## 2019-05-12 DIAGNOSIS — Z880 Allergy status to penicillin: Secondary | ICD-10-CM

## 2019-05-12 DIAGNOSIS — Z833 Family history of diabetes mellitus: Secondary | ICD-10-CM

## 2019-05-12 DIAGNOSIS — Z923 Personal history of irradiation: Secondary | ICD-10-CM

## 2019-05-12 DIAGNOSIS — R079 Chest pain, unspecified: Secondary | ICD-10-CM | POA: Diagnosis present

## 2019-05-12 DIAGNOSIS — Z9861 Coronary angioplasty status: Secondary | ICD-10-CM

## 2019-05-12 DIAGNOSIS — E1122 Type 2 diabetes mellitus with diabetic chronic kidney disease: Secondary | ICD-10-CM | POA: Diagnosis not present

## 2019-05-12 DIAGNOSIS — Z794 Long term (current) use of insulin: Secondary | ICD-10-CM

## 2019-05-12 DIAGNOSIS — Z885 Allergy status to narcotic agent status: Secondary | ICD-10-CM

## 2019-05-12 DIAGNOSIS — Z79899 Other long term (current) drug therapy: Secondary | ICD-10-CM

## 2019-05-12 DIAGNOSIS — R0789 Other chest pain: Secondary | ICD-10-CM | POA: Diagnosis not present

## 2019-05-12 DIAGNOSIS — I25118 Atherosclerotic heart disease of native coronary artery with other forms of angina pectoris: Secondary | ICD-10-CM | POA: Diagnosis not present

## 2019-05-12 DIAGNOSIS — G4730 Sleep apnea, unspecified: Secondary | ICD-10-CM | POA: Diagnosis present

## 2019-05-12 DIAGNOSIS — R509 Fever, unspecified: Secondary | ICD-10-CM

## 2019-05-12 DIAGNOSIS — E875 Hyperkalemia: Secondary | ICD-10-CM | POA: Diagnosis not present

## 2019-05-12 DIAGNOSIS — J45909 Unspecified asthma, uncomplicated: Secondary | ICD-10-CM

## 2019-05-12 DIAGNOSIS — R739 Hyperglycemia, unspecified: Secondary | ICD-10-CM | POA: Diagnosis not present

## 2019-05-12 DIAGNOSIS — Z8673 Personal history of transient ischemic attack (TIA), and cerebral infarction without residual deficits: Secondary | ICD-10-CM | POA: Diagnosis not present

## 2019-05-12 DIAGNOSIS — E1165 Type 2 diabetes mellitus with hyperglycemia: Secondary | ICD-10-CM | POA: Diagnosis not present

## 2019-05-12 DIAGNOSIS — R197 Diarrhea, unspecified: Secondary | ICD-10-CM | POA: Diagnosis not present

## 2019-05-12 DIAGNOSIS — Z9221 Personal history of antineoplastic chemotherapy: Secondary | ICD-10-CM

## 2019-05-12 DIAGNOSIS — N183 Chronic kidney disease, stage 3 unspecified: Secondary | ICD-10-CM | POA: Diagnosis present

## 2019-05-12 DIAGNOSIS — R296 Repeated falls: Secondary | ICD-10-CM | POA: Diagnosis present

## 2019-05-12 DIAGNOSIS — I213 ST elevation (STEMI) myocardial infarction of unspecified site: Secondary | ICD-10-CM | POA: Diagnosis not present

## 2019-05-12 DIAGNOSIS — I2510 Atherosclerotic heart disease of native coronary artery without angina pectoris: Secondary | ICD-10-CM | POA: Diagnosis present

## 2019-05-12 DIAGNOSIS — D573 Sickle-cell trait: Secondary | ICD-10-CM | POA: Diagnosis present

## 2019-05-12 DIAGNOSIS — Z20828 Contact with and (suspected) exposure to other viral communicable diseases: Secondary | ICD-10-CM | POA: Diagnosis not present

## 2019-05-12 DIAGNOSIS — Z96649 Presence of unspecified artificial hip joint: Secondary | ICD-10-CM | POA: Diagnosis present

## 2019-05-12 DIAGNOSIS — R0602 Shortness of breath: Secondary | ICD-10-CM | POA: Diagnosis not present

## 2019-05-12 DIAGNOSIS — Z85028 Personal history of other malignant neoplasm of stomach: Secondary | ICD-10-CM

## 2019-05-12 DIAGNOSIS — K219 Gastro-esophageal reflux disease without esophagitis: Secondary | ICD-10-CM | POA: Diagnosis present

## 2019-05-12 DIAGNOSIS — E1121 Type 2 diabetes mellitus with diabetic nephropathy: Secondary | ICD-10-CM | POA: Diagnosis present

## 2019-05-12 DIAGNOSIS — I252 Old myocardial infarction: Secondary | ICD-10-CM

## 2019-05-12 DIAGNOSIS — I129 Hypertensive chronic kidney disease with stage 1 through stage 4 chronic kidney disease, or unspecified chronic kidney disease: Secondary | ICD-10-CM | POA: Diagnosis present

## 2019-05-12 DIAGNOSIS — G459 Transient cerebral ischemic attack, unspecified: Secondary | ICD-10-CM | POA: Diagnosis not present

## 2019-05-12 DIAGNOSIS — M79632 Pain in left forearm: Secondary | ICD-10-CM | POA: Diagnosis not present

## 2019-05-12 DIAGNOSIS — R072 Precordial pain: Secondary | ICD-10-CM | POA: Diagnosis not present

## 2019-05-12 DIAGNOSIS — Z87891 Personal history of nicotine dependence: Secondary | ICD-10-CM

## 2019-05-12 DIAGNOSIS — Z7902 Long term (current) use of antithrombotics/antiplatelets: Secondary | ICD-10-CM

## 2019-05-12 LAB — CBC
HCT: 35.8 % — ABNORMAL LOW (ref 36.0–46.0)
Hemoglobin: 11.5 g/dL — ABNORMAL LOW (ref 12.0–15.0)
MCH: 29.1 pg (ref 26.0–34.0)
MCHC: 32.1 g/dL (ref 30.0–36.0)
MCV: 90.6 fL (ref 80.0–100.0)
Platelets: 260 10*3/uL (ref 150–400)
RBC: 3.95 MIL/uL (ref 3.87–5.11)
RDW: 17.5 % — ABNORMAL HIGH (ref 11.5–15.5)
WBC: 1.8 10*3/uL — ABNORMAL LOW (ref 4.0–10.5)
nRBC: 0 % (ref 0.0–0.2)

## 2019-05-12 LAB — CBG MONITORING, ED: Glucose-Capillary: 380 mg/dL — ABNORMAL HIGH (ref 70–99)

## 2019-05-12 MED ORDER — SODIUM CHLORIDE 0.9% FLUSH: 3.0000 mL | Freq: Once | INTRAVENOUS | Status: DC

## 2019-05-12 MED ORDER — LIDOCAINE VISCOUS HCL 2 % MT SOLN
15.0000 mL | Freq: Once | OROMUCOSAL | Status: AC
  Administered 2019-05-13: 15 mL via ORAL
  Filled 2019-05-12: qty 15

## 2019-05-12 MED ORDER — ALUM & MAG HYDROXIDE-SIMETH 200-200-20 MG/5ML PO SUSP
30.0000 mL | Freq: Once | ORAL | Status: AC
  Administered 2019-05-13: 30 mL via ORAL
  Filled 2019-05-12: qty 30

## 2019-05-12 NOTE — ED Triage Notes (Addendum)
Patient arrives via EMS with c/o chest pain that started this evening around 8pm. Patient describes the pain as a heaviness that was initially a 10/10. Patient took a nitro 0.51m x1 and asprin 3240mat home prior to ems arrival. Patients rates chest pain a 5/10 on arrival.  BP with ems was 90/60 and increased with 250cc NS fluid to 118/64.   CBG on scene 511, pt did not take any insulin today.  Hx of heart stents (most recent in Feb. 2020)

## 2019-05-12 NOTE — ED Notes (Signed)
Dr. Leonette Monarch at bedside.

## 2019-05-12 NOTE — ED Provider Notes (Signed)
[facility] EMERGENCY DEPARTMENT Provider Note  CSN: [id number] Arrival date & time: 05/12/19 2232  Chief Complaint(s) Chest Pain and Shortness of Breath  HPI [name] is a 68 y.o. female   The history is provided by the patient.  Chest Pain Pain location:  Substernal area Pain quality: pressure   Pain radiates to:  R shoulder and neck Pain severity:  Moderate Onset quality:  Sudden Duration:  3 hours Timing:  Constant Progression:  Improving Chronicity:  Recurrent (similar to prior MI) Relieved by:  Nitroglycerin Worsened by:  Nothing Associated symptoms: cough (dry), nausea and shortness of breath   Associated symptoms: no back pain, no claudication, no fatigue, no fever, no heartburn and no lower extremity edema   Shortness of Breath Associated symptoms: chest pain and cough (dry)   Associated symptoms: no claudication and no fever     Past Medical History Past Medical History:  Diagnosis Date  . Asthma   . Degenerative joint disease   . Diabetes mellitus   . Dyspnea   . GERD (gastroesophageal reflux disease)   . Hyperlipidemia    no longer on medication for this  . Hypertension    patient denies  . Myocardial infarction ([place]) 08/2018  . Neuropathy    bilateral hands and feet  . Sickle cell trait ([place])   . Sleep apnea   . stomach ca dx'd 2010   chemo/xrt comp 05/2009  . TIA (transient ischemic attack) 07/2015   Patient Active Problem List   Diagnosis Date Noted  . Neutropenia ([place])   . Obesity with serious comorbidity 04/16/2019  . Gastroenteritis 03/27/2019  . Falls frequently 03/27/2019  . Acute gastroenteritis 03/22/2019  . Hematoma of scalp 03/14/2019  . Vitamin D deficiency 11/09/2018  . Muscle spasm 10/19/2018  . Chest pain 09/07/2018  . Palpitations 09/07/2018  . Other fatigue 09/07/2018  . Acute ST elevation myocardial infarction (STEMI) of inferior wall ([place]) 08/27/2018    Class: Hospitalized for  . Coronary artery  disease involving native coronary artery of native heart with unstable angina pectoris ([place]) 08/27/2018    Class: Diagnosis of  . Acute vaginitis 08/20/2018  . Hyperlipidemia associated with type 2 diabetes mellitus ([place]) 04/09/2018  . Bronchopneumonia 11/02/2017  . Sepsis ( [place]) 11/02/2017  . Leucocytosis 11/02/2017  . Moderate persistent asthma with acute exacerbation 05/26/2017  . Influenza A 05/26/2017  . Acute pain of right shoulder 03/07/2017  . Acute bilateral low back pain without sciatica 03/07/2017  . Chronic pain 03/07/2017  . Fatty liver 03/24/2016  . Weakness 03/23/2016  . Unexplained weight loss 03/23/2016  . Melena 03/23/2016  . Hypotension 02/03/2016  . Syncope 02/03/2016  . Hypomagnesemia 02/03/2016  . AKI (acute kidney injury) ([place]) 02/03/2016  . Gastrointestinal dysmotility 01/20/2016  . CKD (chronic kidney disease) stage 3, GFR 30-59 ml/min (H[facility]) 01/20/2016  . Abscess of right thigh 01/20/2016  . Chronic pain syndrome 01/15/2016  . Diarrhea 01/15/2016  . Generalized abdominal pain 01/14/2016  . Type 2 diabetes with nephropathy ([place]) 12/30/2015  . TIA (transient ischemic attack) 07/2015  . Morbid obesity due to excess calories ([place]) 05/05/2015  . Diabetic polyneuropathy associated with type 2 diabetes mellitus ([place]) 01/02/2015  . Acute bacterial sinusitis 01/02/2015  . Onychomycosis 11/04/2014  . Pain in lower limb 11/04/2014  . Multinodular goiter (nontoxic) 10/16/2014  . History of CVA (cerebrovascular accident) 08/11/2014  . Iron deficiency anemia 05/20/2014  . Major depressive disorder, single episode, moderate ([place]) 05/20/2014  . Dizziness  of unknown cause 04/28/2014  . MDD (major depressive disorder), recurrent episode, moderate ([place]) 01/28/2014  . GERD (gastroesophageal reflux disease) 01/02/2014  . Nausea 10/15/2012  . Stomach cancer ([place]) 12/10/2008  . CHRONIC RESPIRATORY FAILURE 01/03/2008  . Hyperlipidemia 06/27/2007  . OBESITY, MORBID  06/27/2007  . SLEEP APNEA, OBSTRUCTIVE 06/27/2007  . Generalized idiopathic epilepsy and epileptic syndromes, not intractable, with status epilepticus ([place]) 06/27/2007  . Asthma 06/27/2007  . OSTEOARTHRITIS  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 06/27/2007   Home Medication(s) Prior to Admission medications   Medication Sig Start Date End Date Taking? Authorizing Provider  albuterol (PROVENTIL HFA;VENTOLIN HFA) 108 (90 Base) MCG/ACT inhaler Inhale 2 puffs into the lungs every 6 (six) hours as needed for wheezing or shortness of breath. 11/05/17  Yes Emokpae, Courage, MD  albuterol (PROVENTIL) (2.5 MG/3ML) 0.083% nebulizer solution Take 3 mLs (2.5 mg total) by nebulization every 6 (six) hours as needed for wheezing or shortness of breath. 11/05/17  Yes Emokpae, Courage, MD  aspirin EC 81 MG tablet Take 1 tablet (81 mg total) by mouth daily. 02/20/19  Yes Roma Schanz R, DO  clopidogrel (PLAVIX) 75 MG tablet TAKE 4 TABLETS BY MOUTH ON DAY 1 THEN, 1TAB DAILY Patient taking differently: Take 75 mg by mouth daily.  02/06/19  Yes Lendon Colonel, NP  famotidine (PEPCID) 40 MG tablet Take 1 tablet (40 mg total) by mouth daily. 02/12/19  Yes Roma Schanz R, DO  fluticasone (FLONASE) 50 MCG/ACT nasal spray Place 2 sprays into both nostrils daily. Patient taking differently: Place 2 sprays into both nostrils daily as needed for allergies or rhinitis.  04/18/18  Yes Roma Schanz R, DO  gabapentin (NEURONTIN) 800 MG tablet TAKE ONE TABLET BY MOUTH THREE TIMES DAILY ;; Patient taking differently: Take 800 mg by mouth 3 (three) times daily.  03/06/19  Yes Ann Held, DO  Insulin Degludec (TRESIBA FLEXTOUCH) 200 UNIT/ML SOPN Inject 20 Units into the skin daily. 08/31/18  Yes Philemon Kingdom, MD  ipratropium-albuterol (DUONEB) 0.5-2.5 (3) MG/3ML SOLN Take 3 mLs by nebulization every 6 (six) hours as needed. Patient taking differently: Take 3 mLs by nebulization every 6 (six) hours as needed  (sob and wheezing).  05/26/17  Yes Roma Schanz R, DO  isosorbide mononitrate (IMDUR) 30 MG 24 hr tablet Take 0.5 tablets (15 mg total) by mouth daily. *NEEDS OFFICE VISIT* 03/29/19  Yes Lendon Colonel, NP  lidocaine-prilocaine (EMLA) cream Apply 1 application topically as needed. Patient taking differently: Apply 1 application topically as needed (to numb port site.).  02/28/19  Yes Volanda Napoleon, MD  metFORMIN (GLUCOPHAGE) 500 MG tablet Take 1 tablet (500 mg total) by mouth 2 (two) times daily with a meal. 02/13/19  Yes Philemon Kingdom, MD  metoprolol tartrate (LOPRESSOR) 25 MG tablet Take 1 tablet (25 mg total) by mouth 2 (two) times daily. 01/14/19  Yes Lorretta Harp, MD  Multiple Vitamins-Minerals (CENTRUM SILVER ADULT 50+) TABS Take 1 tablet once a day 02/20/19  Yes Roma Schanz R, DO  nitroGLYCERIN (NITROSTAT) 0.4 MG SL tablet Place 1 tablet (0.4 mg total) under the tongue every 5 (five) minutes as needed. 01/14/19  Yes Lorretta Harp, MD  pantoprazole (PROTONIX) 40 MG tablet Take 1 tablet (40 mg total) by mouth daily. 01/02/19  Yes Roma Schanz R, DO  rosuvastatin (CRESTOR) 40 MG tablet TAKE ONE TABLET BY MOUTH EVERY DAY AT BEDTIME Patient taking differently: Take 40  mg by mouth at bedtime.  03/01/19  Yes Leonie Man, MD  sertraline (ZOLOFT) 50 MG tablet Take 1 tablet (50 mg total) by mouth daily. 02/20/19  Yes Roma Schanz R, DO  sucralfate (CARAFATE) 1 g tablet TAKE 1 TABLET BY MOUTH TWICE DAILY Patient taking differently: Take 1 g by mouth 2 (two) times daily.  02/05/19  Yes [place], Holli Humbles, NP  traMADol (ULTRAM) 50 MG tablet TAKE TWO TABLETS BY MOUTH THREE TIMES DAILY AS NEEDED FOR MODERATE PAIN Patient taking differently: Take 100 mg by mouth 3 (three) times daily as needed for moderate pain.  04/05/19  Yes Roma Schanz R, DO  darifenacin (ENABLEX) 7.5 MG 24 hr tablet Take 1 tablet (7.5 mg total) by mouth daily. Patient not taking: Reported  on 05/13/2019 04/03/19   Volanda Napoleon, MD  glimepiride (AMARYL) 4 MG tablet Take 1 tablet (4 mg total) by mouth 2 (two) times daily. Patient not taking: Reported on 05/13/2019 01/24/19   Roma Schanz R, DO  glucose blood (ONETOUCH VERIO) test strip Use as instructed to check blood sugar 3 times a day 09/04/18   Philemon Kingdom, MD  HYDROcodone-acetaminophen (NORCO/VICODIN) 5-325 MG tablet Take 1 tablet by mouth every 4 (four) hours as needed. Patient not taking: Reported on 05/13/2019 04/28/19   Isla Pence, MD  ibuprofen (ADVIL) 600 MG tablet Take 1 tablet (600 mg total) by mouth every 6 (six) hours as needed. Patient not taking: Reported on 05/13/2019 04/28/19   Isla Pence, MD  Insulin Pen Needle (B-D UF III MINI PEN NEEDLES) 31G X 5 MM MISC Use to inject insulin once a day. 01/07/19   Philemon Kingdom, MD  methocarbamol (ROBAXIN) 500 MG tablet Take 1 tablet (500 mg total) by mouth every 6 (six) hours as needed for muscle spasms. Patient not taking: Reported on 05/13/2019 10/19/18   Carollee Herter, Kendrick Fries R, DO  ondansetron (ZOFRAN) 4 MG tablet TAKE ONE TABLET BY MOUTH EVERY EIGHT HOURS AS NEEDED FOR NAUSEA AND VOMITING Patient not taking: Reported on 05/13/2019 03/01/19   Volanda Napoleon, MD  Semaglutide,0.25 or 0.5MG/DOS, (OZEMPIC, 0.25 OR 0.5 MG/DOSE,) 2 MG/1.5ML SOPN Inject 0.5 mg into the skin once a week. 04/16/19   Philemon Kingdom, MD  simethicone (MYLICON) 80 MG chewable tablet Chew 1 tablet (80 mg total) by mouth 4 (four) times daily as needed for flatulence. Patient not taking: Reported on 05/13/2019 03/24/19   Flora Lipps, MD                                                                                                                                    Past Surgical History Past Surgical History:  Procedure Laterality Date  . APPENDECTOMY    . CESAREAN SECTION    . COLON SURGERY     colonscopy  . COLONOSCOPY WITH PROPOFOL N/A 06/01/2016   Procedure: COLONOSCOPY  WITH PROPOFOL;  Surgeon: Evette Doffing  Michail Sermon, MD;  Location: [facility];  Service: Endoscopy;  Laterality: N/A;  . CORONARY STENT INTERVENTION N/A 08/29/2018   Procedure: CORONARY STENT INTERVENTION;  Surgeon: Wellington Hampshire, MD;  Location: [facility];  Service: Cardiovascular;  Laterality: N/A;  . CORONARY/GRAFT ACUTE MI REVASCULARIZATION N/A 08/27/2018   Procedure: Coronary/Graft Acute MI Revascularization;  Surgeon: Leonie Man, MD;  Location: [facility];  Service: Cardiovascular;  Laterality: N/A;  . ESOPHAGOGASTRODUODENOSCOPY N/A 10/15/2012   Procedure: ESOPHAGOGASTRODUODENOSCOPY (EGD);  Surgeon: Lear Ng, MD;  Location: [facility];  Service: Endoscopy;  Laterality: N/A;  . ESOPHAGOGASTRODUODENOSCOPY N/A 01/15/2016   Procedure: ESOPHAGOGASTRODUODENOSCOPY (EGD);  Surgeon: Ronald Lobo, MD;  Location: [facility];  Service: Endoscopy;  Laterality: N/A;  . HERNIA REPAIR    . LEFT HEART CATH AND CORONARY ANGIOGRAPHY N/A 08/27/2018   Procedure: LEFT HEART CATH AND CORONARY ANGIOGRAPHY;  Surgeon: Leonie Man, MD;  Location: [facility];  Service: Cardiovascular;  Laterality: N/A;  . SHOULDER SURGERY     Left  . TOTAL HIP ARTHROPLASTY     Right  . TOTAL KNEE ARTHROPLASTY     Left   Family History Family History  Problem Relation Age of Onset  . Diabetes Brother   . Alzheimer's disease Father 103  . Cancer Mother 92  . Diabetes Maternal Grandmother   . Cancer Maternal Grandfather        lung  . Healthy Child   . Suicidality Neg Hx   . Depression Neg Hx   . Dementia Neg Hx   . Anxiety disorder Neg Hx     Social History Social History   Tobacco Use  . Smoking status: Former Smoker    Packs/day: 1.00    Years: 15.00    Pack years: 15.00    Types: Cigarettes    Start date: 09/27/1973    Quit date: 10/15/1988    Years since quitting: 30.5  . Smokeless tobacco: Never Used  . Tobacco comment: quit smoking 14 years ago  Substance Use Topics   . Alcohol use: No    Alcohol/week: 0.0 standard drinks  . Drug use: No   Allergies Adhesive [tape], Codeine, Zocor [simvastatin - high dose], and Penicillins  Review of Systems Review of Systems  Constitutional: Negative for fatigue and fever.  Respiratory: Positive for cough (dry) and shortness of breath.   Cardiovascular: Positive for chest pain. Negative for claudication.  Gastrointestinal: Positive for nausea. Negative for heartburn.  Musculoskeletal: Negative for back pain.   All other systems are reviewed and are negative for acute change except as noted in the HPI  Physical Exam Vital Signs  I have reviewed the triage vital signs BP 101/65   Pulse 92   Temp 98.2 F (36.8 C) (Oral)   Resp (!) 21   Ht 5' 6"  (1.676 m)   Wt 96.6 kg   SpO2 99%   BMI 34.38 kg/m   Physical Exam Vitals signs reviewed.  Constitutional:      General: She is not in acute distress.    Appearance: She is well-developed. She is not diaphoretic.  HENT:     Head: Normocephalic and atraumatic.     Nose: Nose normal.  Eyes:     General: No scleral icterus.       Right eye: No discharge.        Left eye: No discharge.     Conjunctiva/sclera: Conjunctivae normal.     Pupils: Pupils are equal, round, and  reactive to light.  Neck:     Musculoskeletal: Normal range of motion and neck supple.  Cardiovascular:     Rate and Rhythm: Normal rate and regular rhythm.     Heart sounds: No murmur. No friction rub. No gallop.   Pulmonary:     Effort: Pulmonary effort is normal. No respiratory distress.     Breath sounds: Normal breath sounds. No stridor. No rales.  Abdominal:     General: There is no distension.     Palpations: Abdomen is soft.     Tenderness: There is no abdominal tenderness.  Musculoskeletal:        General: No tenderness.     Right lower leg: No edema.     Left lower leg: No edema.  Skin:    General: Skin is warm and dry.     Findings: No erythema or rash.  Neurological:      Mental Status: She is alert and oriented to person, place, and time.     ED Results and Treatments Labs (all labs ordered are listed, but only abnormal results are displayed) Labs Reviewed  BASIC METABOLIC PANEL - Abnormal; Notable for the following components:      Result Value   CO2 19 (*)    Glucose, Bld 441 (*)    Creatinine, Ser 1.56 (*)    Calcium 8.3 (*)    GFR calc non Af Amer 34 (*)    GFR calc Af Amer 39 (*)    All other components within normal limits  CBC - Abnormal; Notable for the following components:   WBC 1.8 (*)    Hemoglobin 11.5 (*)    HCT 35.8 (*)    RDW 17.5 (*)    All other components within normal limits  HEPATIC FUNCTION PANEL - Abnormal; Notable for the following components:   Total Protein 5.7 (*)    Albumin 2.4 (*)    All other components within normal limits  CBG MONITORING, ED - Abnormal; Notable for the following components:   Glucose-Capillary 380 (*)    All other components within normal limits  SARS CORONAVIRUS 2 (TAT 6-24 HRS)  LIPASE, BLOOD  DIFFERENTIAL  TROPONIN I (HIGH SENSITIVITY)  TROPONIN I (HIGH SENSITIVITY)  TROPONIN I (HIGH SENSITIVITY)                                                                                                                         EKG  EKG Interpretation  Date/Time:  Sunday May 12 2019 22:42:11 EDT Ventricular Rate:  91 PR Interval:    QRS Duration: 113 QT Interval:  356 QTC Calculation: 438 R Axis:   -31 Text Interpretation:  Sinus rhythm Ventricular premature complex Borderline IVCD with LAD Low voltage, extremity and precordial leads Otherwise no significant change Confirmed by Addison Lank [id number]) on 05/12/2019 11:31:10 PM      Radiology Dg Chest 2 View  Result Date: 05/12/2019 CLINICAL DATA:  Chest pain EXAM: CHEST - 2 VIEW  COMPARISON:  None. FINDINGS: The heart size and mediastinal contours are within normal limits. A right-sided MediPort catheter seen with the tip in mid SVC.  Both lungs are clear. The visualized skeletal structures are unremarkable. IMPRESSION: No acute cardiopulmonary process. Electronically Signed   By: Prudencio Pair M.D.   On: 05/12/2019 23:42    Pertinent labs & imaging results that were available during my care of the patient were reviewed by me and considered in my medical decision making (see chart for details).  Medications Ordered in ED Medications  sodium chloride flush (NS) 0.9 % injection 3 mL (has no administration in time range)  morphine 2 MG/ML injection 1 mg (has no administration in time range)  nitroGLYCERIN (NITROSTAT) SL tablet 0.4 mg (has no administration in time range)  0.9 %  sodium chloride infusion (has no administration in time range)  insulin aspart (novoLOG) injection 0-9 Units (has no administration in time range)  ipratropium (ATROVENT HFA) inhaler 2 puff (has no administration in time range)  dextromethorphan-guaiFENesin (MUCINEX DM) 30-600 MG per 12 hr tablet 1 tablet (has no administration in time range)  alum & mag hydroxide-simeth (MAALOX/MYLANTA) 200-200-20 MG/5ML suspension 30 mL (30 mLs Oral Given 05/13/19 0017)    And  lidocaine (XYLOCAINE) 2 % viscous mouth solution 15 mL (15 mLs Oral Given 05/13/19 0017)  sodium chloride 0.9 % bolus 1,000 mL (1,000 mLs Intravenous New Bag/Given 05/13/19 0118)  insulin aspart (novoLOG) injection 6 Units (6 Units Intravenous Given 05/13/19 0117)                                                                                                                                    Procedures Procedures  (including critical care time)  Medical Decision Making / ED Course I have reviewed the nursing notes for this encounter and the patient's prior records (if available in EHR or on provided paperwork).   [name] was evaluated in Emergency Department on 05/13/2019 for the symptoms described in the history of present illness. She was evaluated in the context of the global  COVID-19 pandemic, which necessitated consideration that the patient might be at risk for infection with the SARS-CoV-2 virus that causes COVID-19. Institutional protocols and algorithms that pertain to the evaluation of patients at risk for COVID-19 are in a state of rapid change based on information released by regulatory bodies including the CDC and federal and state organizations. These policies and algorithms were followed during the patient's care in the ED.  Substernal chest pain similar to her prior MI per patient. EKG without acute ischemic changes or evidence of pericarditis. Initial troponin negative.  Labs notable for leukopenia of unknown etiology.  Patient also has hyperglycemia without evidence of DKA. No significant electrolyte derangements. Mild AKI.  Low suspicion for pulmonary embolism.  Presentation not classic for aortic dissection or esophageal perforation.  Chest x-ray without evidence suggestive of pneumonia, pneumothorax, pneumomediastinum.  No abnormal contour of the mediastinum to suggest dissection. No evidence of acute injuries.  Possible GI related versus ACS.  Given her significant cardiac history, observation admission for ACS rule out.      Final Clinical Impression(s) / ED Diagnoses Final diagnoses:  Precordial chest pain  Hyperglycemia      This chart was dictated using voice recognition software.  Despite best efforts to proofread,  errors can occur which can change the documentation meaning.   Fatima Blank, MD 05/13/19 [id number]

## 2019-05-13 DIAGNOSIS — I25118 Atherosclerotic heart disease of native coronary artery with other forms of angina pectoris: Secondary | ICD-10-CM

## 2019-05-13 DIAGNOSIS — K219 Gastro-esophageal reflux disease without esophagitis: Secondary | ICD-10-CM | POA: Diagnosis not present

## 2019-05-13 DIAGNOSIS — E1121 Type 2 diabetes mellitus with diabetic nephropathy: Secondary | ICD-10-CM

## 2019-05-13 DIAGNOSIS — I2510 Atherosclerotic heart disease of native coronary artery without angina pectoris: Secondary | ICD-10-CM | POA: Diagnosis present

## 2019-05-13 DIAGNOSIS — D709 Neutropenia, unspecified: Secondary | ICD-10-CM | POA: Diagnosis not present

## 2019-05-13 DIAGNOSIS — Z8673 Personal history of transient ischemic attack (TIA), and cerebral infarction without residual deficits: Secondary | ICD-10-CM

## 2019-05-13 DIAGNOSIS — G459 Transient cerebral ischemic attack, unspecified: Secondary | ICD-10-CM | POA: Diagnosis not present

## 2019-05-13 DIAGNOSIS — R072 Precordial pain: Secondary | ICD-10-CM | POA: Diagnosis not present

## 2019-05-13 DIAGNOSIS — E785 Hyperlipidemia, unspecified: Secondary | ICD-10-CM | POA: Diagnosis not present

## 2019-05-13 DIAGNOSIS — N183 Chronic kidney disease, stage 3 unspecified: Secondary | ICD-10-CM | POA: Diagnosis not present

## 2019-05-13 DIAGNOSIS — R079 Chest pain, unspecified: Secondary | ICD-10-CM

## 2019-05-13 LAB — DIFFERENTIAL
Abs Immature Granulocytes: 0.01 10*3/uL (ref 0.00–0.07)
Basophils Absolute: 0 10*3/uL (ref 0.0–0.1)
Basophils Relative: 0 %
Eosinophils Absolute: 0 10*3/uL (ref 0.0–0.5)
Eosinophils Relative: 2 %
Immature Granulocytes: 1 %
Lymphocytes Relative: 42 %
Lymphs Abs: 0.8 10*3/uL (ref 0.7–4.0)
Monocytes Absolute: 0 10*3/uL — ABNORMAL LOW (ref 0.1–1.0)
Monocytes Relative: 1 %
Neutro Abs: 1 10*3/uL — ABNORMAL LOW (ref 1.7–7.7)
Neutrophils Relative %: 54 %

## 2019-05-13 LAB — LIPID PANEL
Cholesterol: 75 mg/dL (ref 0–200)
HDL: 46 mg/dL (ref >40)
LDL Cholesterol: 18 mg/dL (ref 0–99)
Total CHOL/HDL Ratio: 1.6 RATIO
Triglycerides: 54 mg/dL (ref <150)
VLDL: 11 mg/dL (ref 0–40)

## 2019-05-13 LAB — BASIC METABOLIC PANEL
Anion gap: 10 (ref 5–15)
Anion gap: 7 (ref 5–15)
BUN: 19 mg/dL (ref 8–23)
BUN: 21 mg/dL (ref 8–23)
CO2: 19 mmol/L — ABNORMAL LOW (ref 22–32)
CO2: 23 mmol/L (ref 22–32)
Calcium: 8.3 mg/dL — ABNORMAL LOW (ref 8.9–10.3)
Calcium: 8.2 mg/dL — ABNORMAL LOW (ref 8.9–10.3)
Chloride: 110 mmol/L (ref 98–111)
Chloride: 110 mmol/L (ref 98–111)
Creatinine, Ser: 1.56 mg/dL — ABNORMAL HIGH (ref 0.44–1.00)
Creatinine, Ser: 1.62 mg/dL — ABNORMAL HIGH (ref 0.44–1.00)
GFR calc Af Amer: 39 mL/min — ABNORMAL LOW (ref >60)
GFR calc Af Amer: 38 mL/min — ABNORMAL LOW (ref >60)
GFR calc non Af Amer: 34 mL/min — ABNORMAL LOW (ref >60)
GFR calc non Af Amer: 33 mL/min — ABNORMAL LOW (ref >60)
Glucose, Bld: 441 mg/dL — ABNORMAL HIGH (ref 70–99)
Glucose, Bld: 261 mg/dL — ABNORMAL HIGH (ref 70–99)
Potassium: 4.8 mmol/L (ref 3.5–5.1)
Potassium: 4.8 mmol/L (ref 3.5–5.1)
Sodium: 139 mmol/L (ref 135–145)
Sodium: 140 mmol/L (ref 135–145)

## 2019-05-13 LAB — MRSA PCR SCREENING: MRSA by PCR: NEGATIVE

## 2019-05-13 LAB — HEPATIC FUNCTION PANEL
ALT: 28 U/L (ref 0–44)
AST: 34 U/L (ref 15–41)
Albumin: 2.4 g/dL — ABNORMAL LOW (ref 3.5–5.0)
Alkaline Phosphatase: 100 U/L (ref 38–126)
Bilirubin, Direct: 0.1 mg/dL (ref 0.0–0.2)
Indirect Bilirubin: 0.4 mg/dL (ref 0.3–0.9)
Total Bilirubin: 0.5 mg/dL (ref 0.3–1.2)
Total Protein: 5.7 g/dL — ABNORMAL LOW (ref 6.5–8.1)

## 2019-05-13 LAB — TROPONIN I (HIGH SENSITIVITY)
Troponin I (High Sensitivity): 6 ng/L (ref <18)
Troponin I (High Sensitivity): 9 ng/L (ref <18)
Troponin I (High Sensitivity): 34 ng/L — ABNORMAL HIGH (ref <18)
Troponin I (High Sensitivity): 374 ng/L (ref <18)

## 2019-05-13 LAB — GLUCOSE, CAPILLARY
Glucose-Capillary: 235 mg/dL — ABNORMAL HIGH (ref 70–99)
Glucose-Capillary: 190 mg/dL — ABNORMAL HIGH (ref 70–99)
Glucose-Capillary: 167 mg/dL — ABNORMAL HIGH (ref 70–99)

## 2019-05-13 LAB — SAVE SMEAR(SSMR), FOR PROVIDER SLIDE REVIEW

## 2019-05-13 LAB — CBG MONITORING, ED: Glucose-Capillary: 234 mg/dL — ABNORMAL HIGH (ref 70–99)

## 2019-05-13 LAB — LIPASE, BLOOD: Lipase: 17 U/L (ref 11–51)

## 2019-05-13 LAB — SARS CORONAVIRUS 2 (TAT 6-24 HRS): SARS Coronavirus 2: NEGATIVE

## 2019-05-13 MED ORDER — INSULIN DEGLUDEC 200 UNIT/ML ~~LOC~~ SOPN: 15.0000 [IU] | PEN_INJECTOR | Freq: Every day | SUBCUTANEOUS | Status: DC

## 2019-05-13 MED ORDER — INSULIN ASPART 100 UNIT/ML ~~LOC~~ SOLN
0.0000 [IU] | SUBCUTANEOUS | Status: DC
  Administered 2019-05-13: 2 [IU] via SUBCUTANEOUS
  Administered 2019-05-13: 3 [IU] via SUBCUTANEOUS
  Administered 2019-05-13: 5 [IU] via SUBCUTANEOUS
  Administered 2019-05-13: 2 [IU] via SUBCUTANEOUS
  Administered 2019-05-14 (×2): 1 [IU] via SUBCUTANEOUS
  Administered 2019-05-14: 5 [IU] via SUBCUTANEOUS
  Administered 2019-05-14: 1 [IU] via SUBCUTANEOUS
  Administered 2019-05-14: 2 [IU] via SUBCUTANEOUS
  Administered 2019-05-15: 3 [IU] via SUBCUTANEOUS
  Administered 2019-05-15: 2 [IU] via SUBCUTANEOUS

## 2019-05-13 MED ORDER — HYDRALAZINE HCL 25 MG PO TABS: 25.0000 mg | ORAL_TABLET | Freq: Three times a day (TID) | ORAL | Status: DC | PRN

## 2019-05-13 MED ORDER — ALBUTEROL SULFATE (2.5 MG/3ML) 0.083% IN NEBU: 2.5000 mg | INHALATION_SOLUTION | RESPIRATORY_TRACT | Status: DC | PRN

## 2019-05-13 MED ORDER — ACETAMINOPHEN 325 MG PO TABS
650.0000 mg | ORAL_TABLET | ORAL | Status: DC | PRN
  Administered 2019-05-13 – 2019-05-14 (×2): 650 mg via ORAL
  Filled 2019-05-13 (×2): qty 2

## 2019-05-13 MED ORDER — ADULT MULTIVITAMIN W/MINERALS CH
1.0000 | ORAL_TABLET | Freq: Every day | ORAL | Status: DC
  Administered 2019-05-13 – 2019-05-16 (×4): 1 via ORAL
  Filled 2019-05-13 (×5): qty 1

## 2019-05-13 MED ORDER — IPRATROPIUM BROMIDE HFA 17 MCG/ACT IN AERS
2.0000 | INHALATION_SPRAY | RESPIRATORY_TRACT | Status: DC
  Filled 2019-05-13: qty 12.9

## 2019-05-13 MED ORDER — MORPHINE SULFATE (PF) 2 MG/ML IV SOLN: 1.0000 mg | INTRAVENOUS | Status: DC | PRN

## 2019-05-13 MED ORDER — SODIUM CHLORIDE 0.9% FLUSH
3.0000 mL | Freq: Two times a day (BID) | INTRAVENOUS | Status: DC
  Administered 2019-05-13 – 2019-05-14 (×3): 3 mL via INTRAVENOUS

## 2019-05-13 MED ORDER — SODIUM CHLORIDE 0.9 % WEIGHT BASED INFUSION
3.0000 mL/kg/h | INTRAVENOUS | Status: AC
  Administered 2019-05-14: 3 mL/kg/h via INTRAVENOUS

## 2019-05-13 MED ORDER — NITROGLYCERIN 0.4 MG SL SUBL: 0.4000 mg | SUBLINGUAL_TABLET | SUBLINGUAL | Status: DC | PRN

## 2019-05-13 MED ORDER — ROSUVASTATIN CALCIUM 20 MG PO TABS
40.0000 mg | ORAL_TABLET | Freq: Every day | ORAL | Status: DC
  Administered 2019-05-13 – 2019-05-15 (×3): 40 mg via ORAL
  Filled 2019-05-13 (×3): qty 2

## 2019-05-13 MED ORDER — ISOSORBIDE MONONITRATE ER 30 MG PO TB24
15.0000 mg | ORAL_TABLET | Freq: Every day | ORAL | Status: DC
  Administered 2019-05-13: 15 mg via ORAL
  Filled 2019-05-13: qty 1

## 2019-05-13 MED ORDER — INSULIN GLARGINE 100 UNIT/ML ~~LOC~~ SOLN
20.0000 [IU] | Freq: Every day | SUBCUTANEOUS | Status: DC
  Administered 2019-05-14 – 2019-05-15 (×2): 20 [IU] via SUBCUTANEOUS
  Filled 2019-05-13 (×2): qty 0.2

## 2019-05-13 MED ORDER — FLUTICASONE PROPIONATE 50 MCG/ACT NA SUSP: 2.0000 | Freq: Every day | NASAL | Status: DC | PRN

## 2019-05-13 MED ORDER — CLOPIDOGREL BISULFATE 75 MG PO TABS: 75.0000 mg | ORAL_TABLET | Freq: Every day | ORAL | Status: DC

## 2019-05-13 MED ORDER — FAMOTIDINE 40 MG PO TABS
40.0000 mg | ORAL_TABLET | Freq: Every day | ORAL | Status: DC
  Administered 2019-05-13: 40 mg via ORAL
  Filled 2019-05-13: qty 1
  Filled 2019-05-13: qty 2

## 2019-05-13 MED ORDER — ALBUTEROL SULFATE HFA 108 (90 BASE) MCG/ACT IN AERS
2.0000 | INHALATION_SPRAY | RESPIRATORY_TRACT | Status: DC | PRN
  Filled 2019-05-13: qty 6.7

## 2019-05-13 MED ORDER — SODIUM CHLORIDE 0.9 % IV SOLN
INTRAVENOUS | Status: DC
  Administered 2019-05-13: 07:00:00 via INTRAVENOUS

## 2019-05-13 MED ORDER — SERTRALINE HCL 50 MG PO TABS
50.0000 mg | ORAL_TABLET | Freq: Every day | ORAL | Status: DC
  Administered 2019-05-13 – 2019-05-16 (×4): 50 mg via ORAL
  Filled 2019-05-13 (×4): qty 1

## 2019-05-13 MED ORDER — GABAPENTIN 400 MG PO CAPS
800.0000 mg | ORAL_CAPSULE | Freq: Three times a day (TID) | ORAL | Status: DC
  Administered 2019-05-13 – 2019-05-14 (×4): 800 mg via ORAL
  Filled 2019-05-13 (×4): qty 2

## 2019-05-13 MED ORDER — INSULIN GLARGINE 100 UNIT/ML ~~LOC~~ SOLN
16.0000 [IU] | Freq: Every day | SUBCUTANEOUS | Status: DC
  Administered 2019-05-13: 16 [IU] via SUBCUTANEOUS
  Filled 2019-05-13: qty 0.16

## 2019-05-13 MED ORDER — TRAMADOL HCL 50 MG PO TABS
100.0000 mg | ORAL_TABLET | Freq: Three times a day (TID) | ORAL | Status: DC | PRN
  Administered 2019-05-14 – 2019-05-16 (×3): 100 mg via ORAL
  Filled 2019-05-13 (×3): qty 2

## 2019-05-13 MED ORDER — METOPROLOL TARTRATE 25 MG PO TABS
25.0000 mg | ORAL_TABLET | Freq: Two times a day (BID) | ORAL | Status: DC
  Administered 2019-05-13 – 2019-05-16 (×6): 25 mg via ORAL
  Filled 2019-05-13 (×7): qty 1

## 2019-05-13 MED ORDER — ONDANSETRON HCL 4 MG/2ML IJ SOLN: 4.0000 mg | Freq: Four times a day (QID) | INTRAMUSCULAR | Status: DC | PRN

## 2019-05-13 MED ORDER — GABAPENTIN 800 MG PO TABS
800.0000 mg | ORAL_TABLET | Freq: Three times a day (TID) | ORAL | Status: DC
  Filled 2019-05-13: qty 1

## 2019-05-13 MED ORDER — ENOXAPARIN SODIUM 40 MG/0.4ML ~~LOC~~ SOLN
40.0000 mg | SUBCUTANEOUS | Status: DC
  Administered 2019-05-13 – 2019-05-16 (×4): 40 mg via SUBCUTANEOUS
  Filled 2019-05-13 (×4): qty 0.4

## 2019-05-13 MED ORDER — SODIUM CHLORIDE 0.9 % IV BOLUS
1000.0000 mL | Freq: Once | INTRAVENOUS | Status: AC
  Administered 2019-05-13: 1000 mL via INTRAVENOUS

## 2019-05-13 MED ORDER — SODIUM CHLORIDE 0.9 % IV SOLN: INTRAVENOUS | Status: AC

## 2019-05-13 MED ORDER — DM-GUAIFENESIN ER 30-600 MG PO TB12: 1.0000 | ORAL_TABLET | Freq: Two times a day (BID) | ORAL | Status: DC | PRN

## 2019-05-13 MED ORDER — SODIUM CHLORIDE 0.9 % IV SOLN: 250.0000 mL | INTRAVENOUS | Status: DC | PRN

## 2019-05-13 MED ORDER — CLOPIDOGREL BISULFATE 75 MG PO TABS
75.0000 mg | ORAL_TABLET | Freq: Every day | ORAL | Status: DC
  Administered 2019-05-13 – 2019-05-16 (×4): 75 mg via ORAL
  Filled 2019-05-13 (×4): qty 1

## 2019-05-13 MED ORDER — SODIUM CHLORIDE 0.9 % WEIGHT BASED INFUSION
1.0000 mL/kg/h | INTRAVENOUS | Status: DC
  Administered 2019-05-14: 1 mL/kg/h via INTRAVENOUS

## 2019-05-13 MED ORDER — ASPIRIN EC 81 MG PO TBEC
81.0000 mg | DELAYED_RELEASE_TABLET | Freq: Every day | ORAL | Status: DC
  Administered 2019-05-13 – 2019-05-16 (×4): 81 mg via ORAL
  Filled 2019-05-13 (×4): qty 1

## 2019-05-13 MED ORDER — SODIUM CHLORIDE 0.9% FLUSH: 3.0000 mL | INTRAVENOUS | Status: DC | PRN

## 2019-05-13 MED ORDER — SUCRALFATE 1 G PO TABS
1.0000 g | ORAL_TABLET | Freq: Two times a day (BID) | ORAL | Status: DC
  Administered 2019-05-13 – 2019-05-16 (×7): 1 g via ORAL
  Filled 2019-05-13 (×7): qty 1

## 2019-05-13 MED ORDER — PANTOPRAZOLE SODIUM 40 MG PO TBEC
40.0000 mg | DELAYED_RELEASE_TABLET | Freq: Every day | ORAL | Status: DC
  Administered 2019-05-13 – 2019-05-16 (×4): 40 mg via ORAL
  Filled 2019-05-13 (×4): qty 1

## 2019-05-13 MED ORDER — FLUTICASONE PROPIONATE 50 MCG/ACT NA SUSP
2.0000 | Freq: Every day | NASAL | Status: DC | PRN
  Filled 2019-05-13: qty 16

## 2019-05-13 MED ORDER — INSULIN ASPART 100 UNIT/ML ~~LOC~~ SOLN
6.0000 [IU] | Freq: Once | SUBCUTANEOUS | Status: AC
  Administered 2019-05-13: 6 [IU] via INTRAVENOUS

## 2019-05-13 NOTE — H&P (Signed)
History and Physical    [name] GEX:[id number] DOB: 03/11/1951 DOA: 05/12/2019  Referring MD/NP/PA:   PCP: Ann Held, DO   Patient coming from:  The patient is coming from home.  At baseline, pt is independent for most of ADL.        Chief Complaint: Chest pain  HPI: [name] is a 68 y.o. female with medical history significant of HTN, HLD, DM, TIA, CAD, s/p of DES, ashtma, depression, sickle cell trait, stomach cancer (s/p surgery, radiation and chemotherapy, not on medication currently), who presents with chest pain.  Patient states that her chest pain started at about 8 PM, which is located in the substernal area, initially 10 out of 10 in severity, currently 4 out of 10 in severity, pressure like, radiating to the neck, not pleuritic, not aggravated by deep breath.  Initially patient had shortness of breath, which has resolved.  She has  dry cough, but no fever chills.  Denies nausea, vomiting, diarrhea, abdominal pain, symptoms of UTI or unilateral weakness.  Cardiac cath on 08/29/18:  Previously placed Prox RCA to Mid RCA drug eluting stent is widely patent.  Balloon angioplasty was performed.  Prox RCA lesion is 20% stenosed.  Prox Cx to Mid Cx lesion is 90% stenosed.  Post intervention, there is a 0% residual stenosis.  A drug-eluting stent was successfully placed using a STENT RESOLUTE ONYX 2.5X18.   ED Course: pt was found to have troponin 6, WBC 1.8, pending lipase, pending COVID-19 test, worsened renal function, elevated blood sugar 441, anion gap 10, temperature normal, blood pressure 101/65, heart rate 90s, oxygen sat 96% on room air.  Chest x-ray negative.  Patient is placed on telemetry bed for observation.  Review of Systems:   General: no fevers, chills, no body weight gain, has fatigue HEENT: no blurry vision, hearing changes or sore throat Respiratory: has dyspnea, coughing, no wheezing CV: has chest pain, no palpitations GI: no nausea,  vomiting, abdominal pain, diarrhea, constipation GU: no dysuria, burning on urination, increased urinary frequency, hematuria  Ext: no leg edema Neuro: no unilateral weakness, numbness, or tingling, no vision change or hearing loss Skin: no rash, no skin tear. MSK: No muscle spasm, no deformity, no limitation of range of movement in spin Heme: No easy bruising.  Travel history: No recent long distant travel.  Allergy:  Allergies  Allergen Reactions  . Adhesive [Tape] Other (See Comments)    Burn Skin  . Codeine Other (See Comments)    paranoid  . Zocor [Simvastatin - High Dose] Other (See Comments)    Muscle spasms  . Penicillins Rash    Has patient had a PCN reaction causing immediate rash, facial/tongue/throat swelling, SOB or lightheadedness with hypotension: Yes Has patient had a PCN reaction causing severe rash involving mucus membranes or skin necrosis: No Has patient had a PCN reaction that required hospitalization does not remember.  Has patient had a PCN reaction occurring within the last 10 years: No If all of the above answers are "NO", then may proceed with Cephalosporin use.     Past Medical History:  Diagnosis Date  . Asthma   . Degenerative joint disease   . Diabetes mellitus   . Dyspnea   . GERD (gastroesophageal reflux disease)   . Hyperlipidemia    no longer on medication for this  . Hypertension    patient denies  . Myocardial infarction ([place]) 08/2018  . Neuropathy    bilateral hands and  feet  . Sickle cell trait ([place])   . Sleep apnea   . stomach ca dx'd 2010   chemo/xrt comp 05/2009  . TIA (transient ischemic attack) 07/2015    Past Surgical History:  Procedure Laterality Date  . APPENDECTOMY    . CESAREAN SECTION    . COLON SURGERY     colonscopy  . COLONOSCOPY WITH PROPOFOL N/A 06/01/2016   Procedure: COLONOSCOPY WITH PROPOFOL;  Surgeon: Wilford Corner, MD;  Location: [facility] ENDOSCOPY;  Service: Endoscopy;  Laterality: N/A;  . CORONARY STENT  INTERVENTION N/A 08/29/2018   Procedure: CORONARY STENT INTERVENTION;  Surgeon: Wellington Hampshire, MD;  Location: [facility];  Service: Cardiovascular;  Laterality: N/A;  . CORONARY/GRAFT ACUTE MI REVASCULARIZATION N/A 08/27/2018   Procedure: Coronary/Graft Acute MI Revascularization;  Surgeon: Leonie Man, MD;  Location: [facility];  Service: Cardiovascular;  Laterality: N/A;  . ESOPHAGOGASTRODUODENOSCOPY N/A 10/15/2012   Procedure: ESOPHAGOGASTRODUODENOSCOPY (EGD);  Surgeon: Lear Ng, MD;  Location: [facility];  Service: Endoscopy;  Laterality: N/A;  . ESOPHAGOGASTRODUODENOSCOPY N/A 01/15/2016   Procedure: ESOPHAGOGASTRODUODENOSCOPY (EGD);  Surgeon: Ronald Lobo, MD;  Location: [facility] ENDOSCOPY;  Service: Endoscopy;  Laterality: N/A;  . HERNIA REPAIR    . LEFT HEART CATH AND CORONARY ANGIOGRAPHY N/A 08/27/2018   Procedure: LEFT HEART CATH AND CORONARY ANGIOGRAPHY;  Surgeon: Leonie Man, MD;  Location: [facility];  Service: Cardiovascular;  Laterality: N/A;  . SHOULDER SURGERY     Left  . TOTAL HIP ARTHROPLASTY     Right  . TOTAL KNEE ARTHROPLASTY     Left    Social History:  reports that she quit smoking about 30 years ago. Her smoking use included cigarettes. She started smoking about 45 years ago. She has a 15.00 pack-year smoking history. She has never used smokeless tobacco. She reports that she does not drink alcohol or use drugs.  Family History:  Family History  Problem Relation Age of Onset  . Diabetes Brother   . Alzheimer's disease Father 78  . Cancer Mother 96  . Diabetes Maternal Grandmother   . Cancer Maternal Grandfather        lung  . Healthy Child   . Suicidality Neg Hx   . Depression Neg Hx   . Dementia Neg Hx   . Anxiety disorder Neg Hx      Prior to Admission medications   Medication Sig Start Date End Date Taking? Authorizing Provider  albuterol (PROVENTIL HFA;VENTOLIN HFA) 108 (90 Base) MCG/ACT inhaler Inhale 2 puffs  into the lungs every 6 (six) hours as needed for wheezing or shortness of breath. 11/05/17  Yes Emokpae, Courage, MD  albuterol (PROVENTIL) (2.5 MG/3ML) 0.083% nebulizer solution Take 3 mLs (2.5 mg total) by nebulization every 6 (six) hours as needed for wheezing or shortness of breath. 11/05/17  Yes Emokpae, Courage, MD  aspirin EC 81 MG tablet Take 1 tablet (81 mg total) by mouth daily. 02/20/19  Yes Roma Schanz R, DO  clopidogrel (PLAVIX) 75 MG tablet TAKE 4 TABLETS BY MOUTH ON DAY 1 THEN, 1TAB DAILY Patient taking differently: Take 75 mg by mouth daily.  02/06/19  Yes Lendon Colonel, NP  famotidine (PEPCID) 40 MG tablet Take 1 tablet (40 mg total) by mouth daily. 02/12/19  Yes Roma Schanz R, DO  fluticasone (FLONASE) 50 MCG/ACT nasal spray Place 2 sprays into both nostrils daily. Patient taking differently: Place 2 sprays into both nostrils daily as needed for  allergies or rhinitis.  04/18/18  Yes Roma Schanz R, DO  gabapentin (NEURONTIN) 800 MG tablet TAKE ONE TABLET BY MOUTH THREE TIMES DAILY ;; Patient taking differently: Take 800 mg by mouth 3 (three) times daily.  03/06/19  Yes Ann Held, DO  Insulin Degludec (TRESIBA FLEXTOUCH) 200 UNIT/ML SOPN Inject 20 Units into the skin daily. 08/31/18  Yes Philemon Kingdom, MD  ipratropium-albuterol (DUONEB) 0.5-2.5 (3) MG/3ML SOLN Take 3 mLs by nebulization every 6 (six) hours as needed. Patient taking differently: Take 3 mLs by nebulization every 6 (six) hours as needed (sob and wheezing).  05/26/17  Yes Roma Schanz R, DO  isosorbide mononitrate (IMDUR) 30 MG 24 hr tablet Take 0.5 tablets (15 mg total) by mouth daily. *NEEDS OFFICE VISIT* 03/29/19  Yes Lendon Colonel, NP  lidocaine-prilocaine (EMLA) cream Apply 1 application topically as needed. Patient taking differently: Apply 1 application topically as needed (to numb port site.).  02/28/19  Yes Volanda Napoleon, MD  metFORMIN (GLUCOPHAGE) 500 MG tablet  Take 1 tablet (500 mg total) by mouth 2 (two) times daily with a meal. 02/13/19  Yes Philemon Kingdom, MD  metoprolol tartrate (LOPRESSOR) 25 MG tablet Take 1 tablet (25 mg total) by mouth 2 (two) times daily. 01/14/19  Yes Lorretta Harp, MD  Multiple Vitamins-Minerals (CENTRUM SILVER ADULT 50+) TABS Take 1 tablet once a day 02/20/19  Yes Roma Schanz R, DO  nitroGLYCERIN (NITROSTAT) 0.4 MG SL tablet Place 1 tablet (0.4 mg total) under the tongue every 5 (five) minutes as needed. 01/14/19  Yes Lorretta Harp, MD  pantoprazole (PROTONIX) 40 MG tablet Take 1 tablet (40 mg total) by mouth daily. 01/02/19  Yes Roma Schanz R, DO  rosuvastatin (CRESTOR) 40 MG tablet TAKE ONE TABLET BY MOUTH EVERY DAY AT BEDTIME Patient taking differently: Take 40 mg by mouth at bedtime.  03/01/19  Yes Leonie Man, MD  sertraline (ZOLOFT) 50 MG tablet Take 1 tablet (50 mg total) by mouth daily. 02/20/19  Yes Roma Schanz R, DO  sucralfate (CARAFATE) 1 g tablet TAKE 1 TABLET BY MOUTH TWICE DAILY Patient taking differently: Take 1 g by mouth 2 (two) times daily.  02/05/19  Yes [place], Holli Humbles, NP  traMADol (ULTRAM) 50 MG tablet TAKE TWO TABLETS BY MOUTH THREE TIMES DAILY AS NEEDED FOR MODERATE PAIN Patient taking differently: Take 100 mg by mouth 3 (three) times daily as needed for moderate pain.  04/05/19  Yes Roma Schanz R, DO  darifenacin (ENABLEX) 7.5 MG 24 hr tablet Take 1 tablet (7.5 mg total) by mouth daily. Patient not taking: Reported on 05/13/2019 04/03/19   Volanda Napoleon, MD  glimepiride (AMARYL) 4 MG tablet Take 1 tablet (4 mg total) by mouth 2 (two) times daily. Patient not taking: Reported on 05/13/2019 01/24/19   Roma Schanz R, DO  glucose blood (ONETOUCH VERIO) test strip Use as instructed to check blood sugar 3 times a day 09/04/18   Philemon Kingdom, MD  HYDROcodone-acetaminophen (NORCO/VICODIN) 5-325 MG tablet Take 1 tablet by mouth every 4 (four) hours as  needed. Patient not taking: Reported on 05/13/2019 04/28/19   Isla Pence, MD  ibuprofen (ADVIL) 600 MG tablet Take 1 tablet (600 mg total) by mouth every 6 (six) hours as needed. Patient not taking: Reported on 05/13/2019 04/28/19   Isla Pence, MD  Insulin Pen Needle (B-D UF III MINI PEN NEEDLES) 31G X 5 MM MISC Use to inject  insulin once a day. 01/07/19   Philemon Kingdom, MD  methocarbamol (ROBAXIN) 500 MG tablet Take 1 tablet (500 mg total) by mouth every 6 (six) hours as needed for muscle spasms. Patient not taking: Reported on 05/13/2019 10/19/18   Carollee Herter, Kendrick Fries R, DO  ondansetron (ZOFRAN) 4 MG tablet TAKE ONE TABLET BY MOUTH EVERY EIGHT HOURS AS NEEDED FOR NAUSEA AND VOMITING Patient not taking: Reported on 05/13/2019 03/01/19   Volanda Napoleon, MD  Semaglutide,0.25 or 0.5MG/DOS, (OZEMPIC, 0.25 OR 0.5 MG/DOSE,) 2 MG/1.5ML SOPN Inject 0.5 mg into the skin once a week. 04/16/19   Philemon Kingdom, MD  simethicone (MYLICON) 80 MG chewable tablet Chew 1 tablet (80 mg total) by mouth 4 (four) times daily as needed for flatulence. Patient not taking: Reported on 05/13/2019 03/24/19   Flora Lipps, MD    Physical Exam: Vitals:   05/12/19 2248 05/12/19 2249 05/12/19 2256 05/12/19 2300  BP: 99/64   101/65  Pulse: 91   92  Resp: 20   (!) 21  Temp: 98.2 F (36.8 C)     TempSrc: Oral     SpO2: 96%  99% 99%  Weight:  96.6 kg    Height:  5' 6"  (1.676 m)     General: Not in acute distress HEENT:       Eyes: PERRL, EOMI, no scleral icterus.       ENT: No discharge from the ears and nose, no pharynx injection, no tonsillar enlargement.        Neck: No JVD, no bruit, no mass felt. Heme: No neck lymph node enlargement. Cardiac: S1/S2, RRR, No murmurs, No gallops or rubs. Respiratory: No rales, wheezing, rhonchi or rubs. GI: Soft, nondistended, nontender, no rebound pain, no organomegaly, BS present. GU: No hematuria Ext: No pitting leg edema bilaterally. 2+DP/PT pulse  bilaterally. Musculoskeletal: No joint deformities, No joint redness or warmth, no limitation of ROM in spin. Skin: No rashes.  Neuro: Alert, oriented X3, cranial nerves II-XII grossly intact, moves all extremities normally.  Psych: Patient is not psychotic, no suicidal or hemocidal ideation.  Labs on Admission: I have personally reviewed following labs and imaging studies  CBC: Recent Labs  Lab 05/12/19 2259  WBC 1.8*  HGB 11.5*  HCT 35.8*  MCV 90.6  PLT 528   Basic Metabolic Panel: Recent Labs  Lab 05/12/19 2259  NA 139  K 4.8  CL 110  CO2 19*  GLUCOSE 441*  BUN 19  CREATININE 1.56*  CALCIUM 8.3*   GFR: Estimated Creatinine Clearance: 41 mL/min (A) (by C-G formula based on SCr of 1.56 mg/dL (H)). Liver Function Tests: Recent Labs  Lab 05/12/19 2259  AST 34  ALT 28  ALKPHOS 100  BILITOT 0.5  PROT 5.7*  ALBUMIN 2.4*   Recent Labs  Lab 05/12/19 2259  LIPASE 17   No results for input(s): AMMONIA in the last 168 hours. Coagulation Profile: No results for input(s): INR, PROTIME in the last 168 hours. Cardiac Enzymes: No results for input(s): CKTOTAL, CKMB, CKMBINDEX, TROPONINI in the last 168 hours. BNP (last 3 results) No results for input(s): PROBNP in the last 8760 hours. HbA1C: No results for input(s): HGBA1C in the last 72 hours. CBG: Recent Labs  Lab 05/12/19 2258  GLUCAP 380*   Lipid Profile: No results for input(s): CHOL, HDL, LDLCALC, TRIG, CHOLHDL, LDLDIRECT in the last 72 hours. Thyroid Function Tests: No results for input(s): TSH, T4TOTAL, FREET4, T3FREE, THYROIDAB in the last 72 hours. Anemia Panel: No  results for input(s): VITAMINB12, FOLATE, FERRITIN, TIBC, IRON, RETICCTPCT in the last 72 hours. Urine analysis:    Component Value Date/Time   COLORURINE YELLOW 03/22/2019 1615   APPEARANCEUR CLOUDY (A) 03/22/2019 1615   LABSPEC 1.015 03/22/2019 1615   LABSPEC 1.015 04/06/2015 0939   PHURINE 7.5 03/22/2019 1615   GLUCOSEU NEGATIVE  03/22/2019 1615   HGBUR NEGATIVE 03/22/2019 1615   BILIRUBINUR NEGATIVE 03/22/2019 1615   BILIRUBINUR 1+ 10/01/2018 1146   KETONESUR NEGATIVE 03/22/2019 1615   PROTEINUR NEGATIVE 03/22/2019 1615   UROBILINOGEN 0.2 10/01/2018 1146   UROBILINOGEN 0.2 04/06/2015 0939   NITRITE NEGATIVE 03/22/2019 1615   LEUKOCYTESUR SMALL (A) 03/22/2019 1615   Sepsis Labs: @LABRCNTIP (procalcitonin:4,lacticidven:4) )No results found for this or any previous visit (from the past 240 hour(s)).   Radiological Exams on Admission: Dg Chest 2 View  Result Date: 05/12/2019 CLINICAL DATA:  Chest pain EXAM: CHEST - 2 VIEW COMPARISON:  None. FINDINGS: The heart size and mediastinal contours are within normal limits. A right-sided MediPort catheter seen with the tip in mid SVC. Both lungs are clear. The visualized skeletal structures are unremarkable. IMPRESSION: No acute cardiopulmonary process. Electronically Signed   By: Prudencio Pair M.D.   On: 05/12/2019 23:42     EKG: Independently reviewed.  Sinus rhythm, QTC 438, LAE, LAD, low voltage, nonspecific T wave change.   Assessment/Plan Principal Problem:   Chest pain Active Problems:   Hyperlipidemia   Asthma   GERD (gastroesophageal reflux disease)   History of CVA (cerebrovascular accident)   Type 2 diabetes with nephropathy (HCC)   CKD (chronic kidney disease) stage 3, GFR 30-59 ml/min (HCC)   TIA (transient ischemic attack)   Neutropenia (HCC)   CAD (coronary artery disease)   Chest pain and hx of CAD: s/p of DES on 08/29/18.  Patient has pressure-like chest pain, not pleuritic.  Chest x-ray negative.  Low suspicion for for PE.  Initial troponin negative.  No significant EKG change. - will place on Tele bed for obs - Trend Trop - Repeat EKG in the am  - prn Nitroglycerin, Morphine, and aspirin, plavix and Crestor - Risk factor stratification: will check FLP and A1C  - check UDS - inpt card consult was requested via Epic  Hyperlipidemia:  -Crestor  Asthma: stable -Atrovent inhaler, albuterol inhaler as needed -As needed Mucinex for cough  GERD (gastroesophageal reflux disease): -Protonix and Pepcid  History of CVA (cerebrovascular accident) and TIA: -on ASA, plavix and Crestor  Type 2 diabetes with nephropathy ([place]): Last A1c 9.1 on 04/03/19, poorly controled. Patient is taking [place], metformin, Ozempic at home.  Blood sugar 441 PM after.  Anion gap 10. -will decrease dose from 20 to 10 unit daily -SSI  CKD (chronic kidney disease) stage 3, GFR 30-59 ml/min ([place]): Baseline creatinine 1.0-1.3.  Her creatinine is 1.56, BUN 19.  Slightly worsening. -IV fluid: 1 L normal saline, then 75 cc/h  Neutropenia ([place]): WBC 1.8.  Unclear etiology. No fever. -will check differential -f/u CBC -get peripheral smear    DVT ppx:  SQ Lovenox Code Status: Full code Family Communication: None at bed side.     Disposition Plan:  Anticipate discharge back to previous home environment Consults called:  none Admission status: Obs / tele     Date of Service 05/13/2019    [facility]   If 7PM-7AM, please contact night-coverage [email or web address] Password [email or web address] 05/13/2019, 1:43 AM

## 2019-05-13 NOTE — ED Notes (Signed)
Pt provided ice water to drink prior to GI cocktail. Pt advised to wait at least 20-30 min after cocktail before drinking any other beverages.

## 2019-05-13 NOTE — Consult Note (Addendum)
The patient has been seen in conjunction with Nathanial Rancher, MD. All aspects of care have been considered and discussed. The patient has been personally interviewed, examined, and all clinical data has been reviewed.   Symptoms are concerning since similar to prior event.  Troponin high has been flat and no EKG changes of ischemia is noted.  Out of an abundance of caution observation is recommended with continued monitoring of ECG, markers, and symptoms.  Ambulate the patient.  If recurrent chest pain, ECG should be done.  With activity, recurrent angina will require coronary angiography to document stent patency.  If no recurrence of symptoms and negative markers/EKG, will need to follow clinically and could likely discharge in a.m.  Cardiology Consultation:   Patient ID: [name]; [id number]; 13-Sep-1950   Admit date: 05/12/2019 Date of Consult: 05/13/2019  Primary Care Provider: Ann Held, DO Primary Cardiologist: Dr. Ellyn Hack Primary Electrophysiologist:  None    Patient Profile:   [name] is a 68 y.o. 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 Blaine Hamper.  History of Present Illness:   Ms. 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  She states she was in her usual state of health until about 8 PM yesterday when she began experiencing "tightness in the chest.  "She states it felt as if  there was a knot in her chest.  She did endorse shortness of breath but quite unusual as she reports her difficulty breathing was in her throat.  She also endorses nausea but denies diaphoresis or vomiting.  At onset of her chest discomfort, she said she took 4 baby aspirin's and 2 nitroglycerin and by the time she arrived at the emergency department, her chest pain had resolved.  She did mention of pain radiating to her right shoulder and right neck. Overall her pain lasted for >20 mins.  In February of this year, she presented with severe chest pressure, shortness of breath and was found to have severe two-vessel CAD and subsequently underwent PCI with DES.  She does mention that this episode of chest discomfort did not resemble her previous episode in February.  She mentions a longstanding history of GERD and states that it has been worsening.  She reports that her symptoms of chest discomfort yesterday "might have been due to her acid reflux. "   After her hospital discharge in February, she had a follow-up visit at which she was complaining of dyspnea on exertion.  The concern was that Brilinta could have been playing a role and therefore she was asked to discontinue Brilinta and start Plavix.   She also reported of prior history of palpitation however Holter monitor was unremarkable.    Past Medical History:  Diagnosis Date  . Asthma   . Degenerative joint disease   . Diabetes mellitus   . Dyspnea   . GERD (gastroesophageal reflux disease)   . Hyperlipidemia    no longer on medication for this  . Hypertension  patient denies  . Myocardial infarction ([place]) 08/2018  . Neuropathy    bilateral hands and feet  . Sickle cell trait ([place])   . Sleep apnea   . stomach ca dx'd 2010   chemo/xrt comp 05/2009  . TIA (transient ischemic attack) 07/2015    Past Surgical History:  Procedure Laterality Date  . APPENDECTOMY    . CESAREAN SECTION    . COLON SURGERY     colonscopy  .  COLONOSCOPY WITH PROPOFOL N/A 06/01/2016   Procedure: COLONOSCOPY WITH PROPOFOL;  Surgeon: Wilford Corner, MD;  Location: [facility] ENDOSCOPY;  Service: Endoscopy;  Laterality: N/A;  . CORONARY STENT INTERVENTION N/A 08/29/2018   Procedure: CORONARY STENT INTERVENTION;  Surgeon: Wellington Hampshire, MD;  Location: [facility] LAB;  Service: Cardiovascular;  Laterality: N/A;  . CORONARY/GRAFT ACUTE MI REVASCULARIZATION N/A 08/27/2018   Procedure: Coronary/Graft Acute MI Revascularization;  Surgeon: Leonie Man, MD;  Location: [facility];  Service: Cardiovascular;  Laterality: N/A;  . ESOPHAGOGASTRODUODENOSCOPY N/A 10/15/2012   Procedure: ESOPHAGOGASTRODUODENOSCOPY (EGD);  Surgeon: Lear Ng, MD;  Location: [facility]OS[facility];  Service: Endoscopy;  Laterality: N/A;  . ESOPHAGOGASTRODUODENOSCOPY N/A 01/15/2016   Procedure: ESOPHAGOGASTRODUODENOSCOPY (EGD);  Surgeon: Ronald Lobo, MD;  Location: [facility] ENDOSCOPY;  Service: Endoscopy;  Laterality: N/A;  . HERNIA REPAIR    . LEFT HEART CATH AND CORONARY ANGIOGRAPHY N/A 08/27/2018   Procedure: LEFT HEART CATH AND CORONARY ANGIOGRAPHY;  Surgeon: Leonie Man, MD;  Location: [facility];  Service: Cardiovascular;  Laterality: N/A;  . SHOULDER SURGERY     Left  . TOTAL HIP ARTHROPLASTY     Right  . TOTAL KNEE ARTHROPLASTY     Left     Home Medications:  Prior to Admission medications   Medication Sig Start Date End Date Taking? Authorizing Provider  albuterol (PROVENTIL HFA;VENTOLIN HFA) 108 (90 Base) MCG/ACT inhaler Inhale 2 puffs into the lungs every 6 (six) hours as needed for wheezing or shortness of breath. 11/05/17  Yes Emokpae, Courage, MD  albuterol (PROVENTIL) (2.5 MG/3ML) 0.083% nebulizer solution Take 3 mLs (2.5 mg total) by nebulization every 6 (six) hours as needed for wheezing or shortness of breath. 11/05/17  Yes Emokpae, Courage, MD  aspirin EC 81 MG tablet Take 1 tablet (81 mg total) by mouth daily. 02/20/19  Yes Roma Schanz R, DO  clopidogrel (PLAVIX) 75 MG tablet TAKE 4 TABLETS BY MOUTH ON DAY 1 THEN, 1TAB DAILY Patient taking differently: Take 75 mg by mouth daily.  02/06/19  Yes Lendon Colonel, NP  famotidine (PEPCID) 40 MG tablet Take 1 tablet (40 mg total) by mouth daily. 02/12/19  Yes Roma Schanz R, DO  fluticasone (FLONASE) 50 MCG/ACT nasal spray Place 2 sprays into both nostrils daily. Patient taking differently: Place 2 sprays into both nostrils daily as needed for allergies or rhinitis.  04/18/18  Yes Roma Schanz R, DO  gabapentin (NEURONTIN) 800 MG tablet TAKE ONE TABLET BY MOUTH THREE TIMES DAILY ;; Patient taking differently: Take 800 mg by mouth 3 (three) times daily.  03/06/19  Yes Ann Held, DO  Insulin Degludec (TRESIBA FLEXTOUCH) 200 UNIT/ML SOPN Inject 20 Units into the skin daily. 08/31/18  Yes Philemon Kingdom, MD  ipratropium-albuterol (DUONEB) 0.5-2.5 (3) MG/3ML SOLN Take 3 mLs by nebulization every 6 (six) hours as needed. Patient taking differently: Take 3 mLs by nebulization every 6 (six) hours as needed (sob and wheezing).  05/26/17  Yes  Carollee Herter, Yvonne R, DO  isosorbide mononitrate (IMDUR) 30 MG 24 hr tablet Take 0.5 tablets (15 mg total) by mouth daily. *NEEDS OFFICE VISIT* 03/29/19  Yes Lendon Colonel, NP  lidocaine-prilocaine (EMLA) cream Apply 1 application topically as needed. Patient taking differently: Apply 1 application topically as needed (to numb port site.).  02/28/19  Yes Volanda Napoleon, MD  metFORMIN (GLUCOPHAGE) 500 MG tablet Take 1 tablet (500 mg total) by mouth 2 (two) times daily with a meal. 02/13/19  Yes Philemon Kingdom, MD  metoprolol tartrate (LOPRESSOR) 25 MG tablet Take 1 tablet (25 mg total) by mouth 2 (two) times daily. 01/14/19  Yes Lorretta Harp, MD  Multiple Vitamins-Minerals (CENTRUM SILVER ADULT 50+) TABS Take 1 tablet once a day 02/20/19  Yes Roma Schanz R, DO  nitroGLYCERIN (NITROSTAT) 0.4 MG SL  tablet Place 1 tablet (0.4 mg total) under the tongue every 5 (five) minutes as needed. 01/14/19  Yes Lorretta Harp, MD  pantoprazole (PROTONIX) 40 MG tablet Take 1 tablet (40 mg total) by mouth daily. 01/02/19  Yes Roma Schanz R, DO  rosuvastatin (CRESTOR) 40 MG tablet TAKE ONE TABLET BY MOUTH EVERY DAY AT BEDTIME Patient taking differently: Take 40 mg by mouth at bedtime.  03/01/19  Yes Leonie Man, MD  sertraline (ZOLOFT) 50 MG tablet Take 1 tablet (50 mg total) by mouth daily. 02/20/19  Yes Roma Schanz R, DO  sucralfate (CARAFATE) 1 g tablet TAKE 1 TABLET BY MOUTH TWICE DAILY Patient taking differently: Take 1 g by mouth 2 (two) times daily.  02/05/19  Yes [place], Holli Humbles, NP  traMADol (ULTRAM) 50 MG tablet TAKE TWO TABLETS BY MOUTH THREE TIMES DAILY AS NEEDED FOR MODERATE PAIN Patient taking differently: Take 100 mg by mouth 3 (three) times daily as needed for moderate pain.  04/05/19  Yes Roma Schanz R, DO  darifenacin (ENABLEX) 7.5 MG 24 hr tablet Take 1 tablet (7.5 mg total) by mouth daily. Patient not taking: Reported on 05/13/2019 04/03/19   Volanda Napoleon, MD  glimepiride (AMARYL) 4 MG tablet Take 1 tablet (4 mg total) by mouth 2 (two) times daily. Patient not taking: Reported on 05/13/2019 01/24/19   Roma Schanz R, DO  glucose blood (ONETOUCH VERIO) test strip Use as instructed to check blood sugar 3 times a day 09/04/18   Philemon Kingdom, MD  HYDROcodone-acetaminophen (NORCO/VICODIN) 5-325 MG tablet Take 1 tablet by mouth every 4 (four) hours as needed. Patient not taking: Reported on 05/13/2019 04/28/19   Isla Pence, MD  ibuprofen (ADVIL) 600 MG tablet Take 1 tablet (600 mg total) by mouth every 6 (six) hours as needed. Patient not taking: Reported on 05/13/2019 04/28/19   Isla Pence, MD  Insulin Pen Needle (B-D UF III MINI PEN NEEDLES) 31G X 5 MM MISC Use to inject insulin once a day. 01/07/19   Philemon Kingdom, MD  methocarbamol  (ROBAXIN) 500 MG tablet Take 1 tablet (500 mg total) by mouth every 6 (six) hours as needed for muscle spasms. Patient not taking: Reported on 05/13/2019 10/19/18   Carollee Herter, Kendrick Fries R, DO  ondansetron (ZOFRAN) 4 MG tablet TAKE ONE TABLET BY MOUTH EVERY EIGHT HOURS AS NEEDED FOR NAUSEA AND VOMITING Patient not taking: Reported on 05/13/2019 03/01/19   Volanda Napoleon, MD  Semaglutide,0.25 or 0.5MG/DOS, (OZEMPIC, 0.25 OR 0.5 MG/DOSE,) 2 MG/1.5ML SOPN Inject 0.5 mg into the skin once a week. 04/16/19   Philemon Kingdom, MD  simethicone (MYLICON) 80 MG chewable tablet Chew 1 tablet (80 mg total) by mouth 4 (four) times daily as needed for flatulence. Patient not taking: Reported on 05/13/2019 03/24/19   Flora Lipps, MD    Inpatient Medications: Scheduled Meds: . aspirin EC  81 mg Oral Daily  . clopidogrel  75 mg Oral Daily  . enoxaparin (LOVENOX) injection  40 mg Subcutaneous Q24H  . famotidine  40 mg Oral Daily  . gabapentin  800 mg Oral TID  . insulin aspart  0-9 Units Subcutaneous Q4H  . insulin glargine  16 Units Subcutaneous Daily  . ipratropium  2 puff Inhalation Q4H  . isosorbide mononitrate  15 mg Oral Daily  . metoprolol tartrate  25 mg Oral BID  . multivitamin with minerals  1 tablet Oral Daily  . pantoprazole  40 mg Oral Daily  . rosuvastatin  40 mg Oral QHS  . sertraline  50 mg Oral Daily  . sodium chloride flush  3 mL Intravenous Once  . sucralfate  1 g Oral BID   Continuous Infusions: . sodium chloride     PRN Meds: acetaminophen, albuterol, dextromethorphan-guaiFENesin, fluticasone, hydrALAZINE, morphine injection, nitroGLYCERIN, ondansetron (ZOFRAN) IV, traMADol  Allergies:    Allergies  Allergen Reactions  . Adhesive [Tape] Other (See Comments)    Burn Skin  . Codeine Other (See Comments)    paranoid  . Zocor [Simvastatin - High Dose] Other (See Comments)    Muscle spasms  . Penicillins Rash    Has patient had a PCN reaction causing immediate rash,  facial/tongue/throat swelling, SOB or lightheadedness with hypotension: Yes Has patient had a PCN reaction causing severe rash involving mucus membranes or skin necrosis: No Has patient had a PCN reaction that required hospitalization does not remember.  Has patient had a PCN reaction occurring within the last 10 years: No If all of the above answers are "NO", then may proceed with Cephalosporin use.     Social History:   Social History   Socioeconomic History  . Marital status: Divorced    Spouse name: Not on file  . Number of children: 4  . Years of education: Not on file  . Highest education level: High school graduate  Occupational History  . Not on file  Social Needs  . Financial resource strain: Not on file  . Food insecurity    Worry: Not on file    Inability: Not on file  . Transportation needs    Medical: Not on file    Non-medical: Not on file  Tobacco Use  . Smoking status: Former Smoker    Packs/day: 1.00    Years: 15.00    Pack years: 15.00    Types: Cigarettes    Start date: 09/27/1973    Quit date: 10/15/1988    Years since quitting: 30.5  . Smokeless tobacco: Never Used  . Tobacco comment: quit smoking 14 years ago  Substance and Sexual Activity  . Alcohol use: No    Alcohol/week: 0.0 standard drinks  . Drug use: No  . Sexual activity: Not on file  Lifestyle  . Physical activity    Days per week: Not on file    Minutes per session: Not on file  . Stress: Not on file  Relationships  . Social Herbalist on phone: Not on file    Gets together: Not on file    Attends religious service: Not on file    Active member of club or organization: Not  on file    Attends meetings of clubs or organizations: Not on file    Relationship status: Not on file  . Intimate partner violence    Fear of current or ex partner: Not on file    Emotionally abused: Not on file    Physically abused: Not on file    Forced sexual activity: Not on file  Other Topics  Concern  . Not on file  Social History Narrative  . Not on file    Family History:   States that several of her aunt's and uncles have "heart problems.  " Family History  Problem Relation Age of Onset  . Diabetes Brother   . Alzheimer's disease Father 40  . Cancer Mother 43  . Diabetes Maternal Grandmother   . Cancer Maternal Grandfather        lung  . Healthy Child   . Suicidality Neg Hx   . Depression Neg Hx   . Dementia Neg Hx   . Anxiety disorder Neg Hx     Social history: She lives in [place] with her 4 children though she lives by herself. She denies tobacco use, EtOH or illicit drug use. 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   ROS:  Please see the history of present illness.  Review of Systems  Constitution: Negative for chills, diaphoresis, fever, malaise/fatigue and night sweats.  Cardiovascular: Positive for chest pain.  Respiratory: Positive for shortness of breath.   Musculoskeletal: Positive for arthritis.  Gastrointestinal: Positive for heartburn.  Genitourinary: Positive for urgency.  Neurological: Negative.   Psychiatric/Behavioral: Negative.     All other ROS reviewed and negative.     Physical Exam/Data:   Vitals:   05/13/19 0230 05/13/19 0231 05/13/19 0342 05/13/19 0815  BP: (!) 87/75  (!) 95/48 106/69  Pulse:  87 87 70  Resp: 17 16 16 15   Temp:   98.7 F (37.1 C)   TempSrc:   Oral   SpO2:  97% 98% 100%  Weight:      Height:       No intake or output data in the 24 hours ending 05/13/19 1127 Filed Weights   05/12/19 2249  Weight: 96.6 kg   Body mass index is 34.38 kg/m.  General:  Well nourished, well developed, in no acute distress HEENT: normal Lymph: no adenopathy Neck: no JVD Endocrine:  No thryomegaly Vascular: Radial and dorsalis pedis pulses palpable bilaterally Cardiac:  normal S1, S2; RRR; no murmur  Lungs:  clear to auscultation bilaterally, no wheezing, rhonchi or  rales  Abd: soft, nontender, no hepatomegaly  Ext: no edema Musculoskeletal:  No deformities, BUE and BLE strength normal and equal Skin: warm and dry  Psych:  Normal affect   EKG:  The EKG was personally reviewed and demonstrates: Low voltage, PVC, no evidence of ST elevation or T wave inversion Telemetry:  Telemetry was personally reviewed and demonstrates:  Relevant CV Studies: 08/27/2018-left heart cath   The left ventricular systolic function is normal.  LV end diastolic pressure is normal.  The left ventricular ejection fraction is greater than 65% by visual estimate.  There is trivial (1+) mitral regurgitation.  There is no aortic valve stenosis.  Prox RCA to Mid RCA lesion is 100% stenosed.  Post intervention, there is a 0% residual stenosis.  A drug-eluting stent was successfully placed using a [place] [id number].  Prox RCA lesion is 20% stenosed.  Prox Cx  lesion is 90% stenosed.   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  Normal LVEDP.   08/28/2018-echocardiogram 1. The left ventricle has low normal systolic function of 00-86%. The cavity size is normal. There is severely increased left ventricular wall thickness of the septal wall. Normal left ventricular filling pressures. The left ventricular diastology could  not be evaluated due to indeterminent diastolic function. There is severe akinesis of the basiliar-mid inferior left ventricular segment.  2. There is mild dilatation of the ascending aorta.  3. Ao Asc diam:3.75 cm.  4. The tricuspid valve is normal in structure. Regurgitation is mild by color flow Doppler.  5. The mitral valve is normal in structure Regurgitation is trivial by color flow Doppler.  6. Compared to prior echo, there is now mid and basal inferior akinesis.  The endocardial segments are not well visualized and Definity contrast would be helpful to delineate if this is a new wall motion abnormality.   08/29/2018-left heart cath  Previously placed Prox RCA to Mid RCA drug eluting stent is widely patent.  Balloon angioplasty was performed.  Prox RCA lesion is 20% stenosed.  Prox Cx to Mid Cx lesion is 90% stenosed.  Post intervention, there is a 0% residual stenosis.  A drug-eluting stent was successfully placed using a STENT RESOLUTE ONYX [id number].   Successful angioplasty and drug-eluting stent placement to the mid left circumflex via the right radial artery.  Laboratory Data:  Chemistry Recent Labs  Lab 05/12/19 2259  NA 139  K 4.8  CL 110  CO2 19*  GLUCOSE 441*  BUN 19  CREATININE 1.56*  CALCIUM 8.3*  GFRNONAA 34*  GFRAA 39*  ANIONGAP 10    Recent Labs  Lab 05/12/19 2259  PROT 5.7*  ALBUMIN 2.4*  AST 34  ALT 28  ALKPHOS 100  BILITOT 0.5   Hematology Recent Labs  Lab 05/12/19 2259  WBC 1.8*  RBC 3.95  HGB 11.5*  HCT 35.8*  MCV 90.6  MCH 29.1  MCHC 32.1  RDW 17.5*  PLT 260   Cardiac EnzymesNo results for input(s): TROPONINI in the last 168 hours. No results for input(s): TROPIPOC in the last 168 hours.  BNPNo results for input(s): BNP, PROBNP in the last 168 hours.  DDimer No results for input(s): DDIMER in the last 168 hours.  Radiology/Studies:  Dg Chest 2 View  Result Date: 05/12/2019 CLINICAL DATA:  Chest pain EXAM: CHEST - 2 VIEW COMPARISON:  None. FINDINGS: The heart size and mediastinal contours are within normal limits. A right-sided MediPort catheter seen with the tip in mid SVC. Both lungs are clear. The visualized skeletal structures are unremarkable. IMPRESSION: No acute cardiopulmonary process. Electronically Signed   By: Prudencio Pair M.D.   On: 05/12/2019 23:42   Assessment and Plan:   #Non-ACS chest pain #Coronary artery disease status post PCI with DES to RCA & Mid-Cx Ms. Chesler is a  very pleasant 68 year old woman with multiple risk factors for ACS including hypertension, hyperlipidemia, diabetes mellitus who presented with chest pressure with associated nausea, radiation to left shoulder and neck that had resolved on arrival to the emergency department although she had taking 2 nitroglycerin and 4 low-dose aspirin.  Currently, she is chest pain-free and denies shortness of breath, diaphoresis, nausea or vomiting.  Her EKG is unrevealing, her  high-sensitivity troponin initially was 9 however repeat was 34 with a delta greater than 20.  Given that she is currently asymptomatic, will continue to observe on medical telemetry and trend troponin as well as follow-up on EKG. -Continue to trend troponin -Follow-up on EKG -Continue aspirin, Plavix, Crestor -As needed nitroglycerin -Continue cardiac monitoring   #GERD She has a longstanding history of acid reflux and though she does not report of typical burning sensation with her episode of chest pain yesterday, she does state there was a possibility that her symptoms could have been due to GERD. -Continue Pepcid, Protonix  #Hyperlipidemia LDL of 18, triglycerides of 54 -Continue Crestor  #History of TIA -Continue aspirin, Plavix, Crestor  #Type 2 diabetes mellitus -Continue insulin -Follow-up A1c  #Hypertension BP is currently soft in the 90-110s/50s-60s.  If BP remains soft, will probably hold Imdur and continue metoprolol -Continue IVF at 75 mL/hr  -Home regimen: Imdur, metoprolol  #Neutropenia CBC yesterday revealed WBC of 1.8.  Her calculated ANC is 972 which indicates moderate neutropenia.  It is reassuring that she has remained afebrile -Continue to follow CBC -Follow-up blood smear  #CKD Stage III sCr of 1.5 (Baseline 1-1.2). suspecting pre-renal etiology -Continue gentle IVF at 34m/hr -Monitor BNP  For questions or updates, please contact CVictoriaPlease consult [email or web address] for contact info under  Cardiology/STEMI.   Signed, OJean Rosenthal MD  05/13/2019 11:27 AM

## 2019-05-13 NOTE — Progress Notes (Signed)
[facility] PROGRESS NOTE  [name] [id number] DOB: 1951/03/13 DOA: 05/12/2019 PCP: Ann Held, DO  Assessment/Plan:  Chest painand hx of CAD: s/p of DES on 08/29/18.Patient with pressure-like chest pain, not pleuritic. Chest x-ray negative. Low suspicion for for PE. Lipid panel within limits of normal.  Troponin 6>>9>>34.No significant EKG change. Evaluated by cardiology who opined not likely ACS but recommend continuing observation of tele, ambulate and trend trops. If no recurrence of symptoms and negative markers/ekg will likely discharge in am. Cards also recommended that if chest pain with ambulation would need coronary angiography to document stent patency.  -order trops -repeat ekg -ambulate -monitor -continue  asa, plavix and crestor.   Hyperlipidemia: lipid panel as noted above. Crestor  Asthma: stable at baseline. Atrovent inhaler, albuterol inhaler as needed  GERD (gastroesophageal reflux disease):  Stable. Continue home meds. Protonix and Pepcid  History of CVA (cerebrovascular accident)and TIA:  -on ASA, plavix and Crestor  Type 2 diabetes with nephropathy (HCC):Poorly controlled. Home meds include takingTresiba, metformin, Ozempic.Blood sugar 441. Anion gap 10. CBG 444. -ssi for optimal control -follow hga1c -diabetes coordinator - OP follow up with PCP for optimal control.   CKD (chronic kidney disease) stage 3, GFR 30-59 ml/min (HCC):Baseline creatinine 1.0-1.3. Her creatinine is 1.56, BUN 19 on admission. Received IV fluids. -Repeat bmet   Neutropenia ([place]): WBC 1.8.Unclear etiology. No fever. Follow differential. OP follow up   Code Status: full Family Communication: patient Disposition Plan: home hopefully in am   Consultants:  Dr Tamala Julian cardiology  Procedures:    Antibiotics:    HPI/Subjective: Awake alert denies chest pain or any discomfort  Objective: Vitals:   05/13/19 1000 05/13/19 1100   BP: 94/61 99/63  Pulse: 74 69  Resp: 15 16  Temp:    SpO2: 96% 96%   No intake or output data in the 24 hours ending 05/13/19 1446 Filed Weights   05/12/19 2249  Weight: 96.6 kg    Exam:   General:  Obese alert no acute distress  Cardiovascular: rrr no mgr no LE edema  Respiratory: normal effort BS clear bilaterally no wheeze  Abdomen: obese soft +BS no guarding or rebounding  Musculoskeletal: joints without swelling/erythema   Data Reviewed: Basic Metabolic Panel: Recent Labs  Lab 05/12/19 2259  NA 139  K 4.8  CL 110  CO2 19*  GLUCOSE 441*  BUN 19  CREATININE 1.56*  CALCIUM 8.3*   Liver Function Tests: Recent Labs  Lab 05/12/19 2259  AST 34  ALT 28  ALKPHOS 100  BILITOT 0.5  PROT 5.7*  ALBUMIN 2.4*   Recent Labs  Lab 05/12/19 2259  LIPASE 17   No results for input(s): AMMONIA in the last 168 hours. CBC: Recent Labs  Lab 05/12/19 2259  WBC 1.8*  NEUTROABS 1.0*  HGB 11.5*  HCT 35.8*  MCV 90.6  PLT 260   Cardiac Enzymes: No results for input(s): CKTOTAL, CKMB, CKMBINDEX, TROPONINI in the last 168 hours. BNP (last 3 results) No results for input(s): BNP in the last 8760 hours.  ProBNP (last 3 results) No results for input(s): PROBNP in the last 8760 hours.  CBG: Recent Labs  Lab 05/12/19 2258 05/13/19 0700  GLUCAP 380* 234*    Recent Results (from the past 240 hour(s))  SARS CORONAVIRUS 2 (TAT 6-24 HRS) Nasopharyngeal Nasopharyngeal Swab     Status: None   Collection Time: 05/13/19 12:10 AM   Specimen: Nasopharyngeal Swab  Result Value Ref Range Status  SARS Coronavirus 2 NEGATIVE NEGATIVE Final    Comment: (NOTE) SARS-CoV-2 target nucleic acids are NOT DETECTED. The SARS-CoV-2 RNA is generally detectable in upper and lower respiratory specimens during the acute phase of infection. Negative results do not preclude SARS-CoV-2 infection, do not rule out co-infections with other pathogens, and should not be used as the sole  basis for treatment or other patient management decisions. Negative results must be combined with clinical observations, patient history, and epidemiological information. The expected result is Negative. Fact Sheet for Patients: SugarRoll.be Fact Sheet for Healthcare Providers: https://www.woods-mathews.com/ This test is not yet approved or cleared by the [place] FDA and  has been authorized for detection and/or diagnosis of SARS-CoV-2 by FDA under an Emergency Use Authorization (EUA). This EUA will remain  in effect (meaning this test can be used) for the duration of the COVID-19 declaration under Section 56 4(b)(1) of the Act, 21 U.S.C. section 360bbb-3(b)(1), unless the authorization is terminated or revoked sooner. Performed at [facility], [place] [address], [place], [place] [address]      Studies: Dg Chest 2 View  Result Date: 05/12/2019 CLINICAL DATA:  Chest pain EXAM: CHEST - 2 VIEW COMPARISON:  None. FINDINGS: The heart size and mediastinal contours are within normal limits. A right-sided MediPort catheter seen with the tip in mid SVC. Both lungs are clear. The visualized skeletal structures are unremarkable. IMPRESSION: No acute cardiopulmonary process. Electronically Signed   By: Prudencio Pair M.D.   On: 05/12/2019 23:42    Scheduled Meds: . aspirin EC  81 mg Oral Daily  . clopidogrel  75 mg Oral Daily  . enoxaparin (LOVENOX) injection  40 mg Subcutaneous Q24H  . famotidine  40 mg Oral Daily  . gabapentin  800 mg Oral TID  . insulin aspart  0-9 Units Subcutaneous Q4H  . insulin glargine  16 Units Subcutaneous Daily  . ipratropium  2 puff Inhalation Q4H  . isosorbide mononitrate  15 mg Oral Daily  . metoprolol tartrate  25 mg Oral BID  . multivitamin with minerals  1 tablet Oral Daily  . pantoprazole  40 mg Oral Daily  . rosuvastatin  40 mg Oral QHS  . sertraline  50 mg Oral Daily  . sodium chloride flush  3 mL  Intravenous Once  . sucralfate  1 g Oral BID   Continuous Infusions: . sodium chloride      Principal Problem:   Chest pain Active Problems:   CKD (chronic kidney disease) stage 3, GFR 30-59 ml/min (HCC)   Hyperlipidemia   GERD (gastroesophageal reflux disease)   Type 2 diabetes with nephropathy (HCC)   CAD (coronary artery disease)   Asthma   History of CVA (cerebrovascular accident)   Neutropenia ([place])    Time spent: 67 minutes    [facility] M NP  [facility]  If 7PM-7AM, please contact night-coverage at [email or web address], password [facility] 05/13/2019, 2:46 PM  LOS: 0 days

## 2019-05-13 NOTE — Discharge Summary (Deleted)
Physician Discharge Summary  [name] [id number] DOB: Nov 24, 1950 DOA: 05/12/2019  PCP: Ann Held, DO  Admit date: 05/12/2019 Discharge date: 05/13/2019  Time spent: 40 minutes  Recommendations for Outpatient Follow-up:  1. Follow up with cardiology as directed 2. Follow up with PCP 1-2 weeks for evaluation of kidney function and WBC as well as BP control and diabetes control   Discharge Diagnoses:  Principal Problem:   Chest pain Active Problems:   CKD (chronic kidney disease) stage 3, GFR 30-59 ml/min (HCC)   Hyperlipidemia   GERD (gastroesophageal reflux disease)   Type 2 diabetes with nephropathy (HCC)   CAD (coronary artery disease)   Asthma   History of CVA (cerebrovascular accident)   Neutropenia ([place])   Discharge Condition: stable  Diet recommendation: heart healthy  Filed Weights   05/12/19 2249  Weight: 96.6 kg    History of present illness:  [name] is a 68 y.o. female with medical history significant of HTN, HLD, DM, TIA, CAD, s/p of DES, ashtma, depression, sickle cell trait, stomach cancer (s/p surgery, radiation and chemotherapy, not on medication currently), who presented 10/18 with chest pain.  Patient stated that her chest pain started at about 8 PM and  located in the substernal area, initially 10 out of 10 in severity, then 4 out of 10 in severity, pressure like, radiating to the neck, not pleuritic, not aggravated by deep breath.  Initially patient had shortness of breath, which resolved.  She had  dry cough, but no fever chills.  Denied nausea, vomiting, diarrhea, abdominal pain, symptoms of UTI or unilateral weakness. COVID negative   Hospital Course:   Chest pain and hx of CAD: s/p of DES on 08/29/18.  Patient with pressure-like chest pain, not pleuritic.  Chest x-ray negative.  Low suspicion for for PE. Lipid panel within limits of normal.  Troponin 6>>9>>34. No significant EKG change. Evaluated by cardiology who opined not  likely ACS. Recommended continuing asa, plavix and crestor.   Hyperlipidemia: lipid panel as noted above. Crestor  Asthma: stable at baseline. Atrovent inhaler, albuterol inhaler as needed  GERD (gastroesophageal reflux disease):  Stable. Continue home meds. Protonix and Pepcid  History of CVA (cerebrovascular accident) and TIA:  -on ASA, plavix and Crestor  Type 2 diabetes with nephropathy ([place]): Poorly controlled. Home meds include taking Tresiba, metformin, Ozempic.  Blood sugar 441.  Anion gap 10. CBG 444. OP follow up with PCP for optimal control.   CKD (chronic kidney disease) stage 3, GFR 30-59 ml/min ([place]): Baseline creatinine 1.0-1.3.  Her creatinine is 1.56, BUN 19 on admission. Received IV fluids. Repeat bmet   Neutropenia ([place]): WBC 1.8.  Unclear etiology. No fever. Follow differential. OP follow up    Procedures:    Consultations:  cards  Discharge Exam: Vitals:   05/13/19 1000 05/13/19 1100  BP: 94/61 99/63  Pulse: 74 69  Resp: 15 16  Temp:    SpO2: 96% 96%    General: awake alert no acute distress Cardiovascular: rrr no mgr no LE edema Respiratory: normal effort BS clear bilaterally no wheeze  Discharge Instructions    Allergies as of 05/13/2019      Reactions   Adhesive [tape] Other (See Comments)   Burn Skin   Codeine Other (See Comments)   paranoid   Zocor [simvastatin - High Dose] Other (See Comments)   Muscle spasms   Penicillins Rash   Has patient had a PCN reaction causing immediate rash, facial/tongue/throat swelling,  SOB or lightheadedness with hypotension: Yes Has patient had a PCN reaction causing severe rash involving mucus membranes or skin necrosis: No Has patient had a PCN reaction that required hospitalization does not remember.  Has patient had a PCN reaction occurring within the last 10 years: No If all of the above answers are "NO", then may proceed with Cephalosporin use.      Medication List    STOP taking  these medications   darifenacin 7.5 MG 24 hr tablet Commonly known as: Enablex   glimepiride 4 MG tablet Commonly known as: AMARYL   HYDROcodone-acetaminophen 5-325 MG tablet Commonly known as: NORCO/VICODIN   ibuprofen 600 MG tablet Commonly known as: ADVIL   methocarbamol 500 MG tablet Commonly known as: Robaxin   ondansetron 4 MG tablet Commonly known as: ZOFRAN     TAKE these medications   albuterol (2.5 MG/3ML) 0.083% nebulizer solution Commonly known as: PROVENTIL Take 3 mLs (2.5 mg total) by nebulization every 6 (six) hours as needed for wheezing or shortness of breath.   albuterol 108 (90 Base) MCG/ACT inhaler Commonly known as: VENTOLIN HFA Inhale 2 puffs into the lungs every 6 (six) hours as needed for wheezing or shortness of breath.   aspirin EC 81 MG tablet Take 1 tablet (81 mg total) by mouth daily.   B-D UF III MINI PEN NEEDLES 31G X 5 MM Misc Generic drug: Insulin Pen Needle Use to inject insulin once a day.   Centrum Silver Adult 50+ Tabs Take 1 tablet once a day   clopidogrel 75 MG tablet Commonly known as: PLAVIX Take 1 tablet (75 mg total) by mouth daily. What changed: See the new instructions.   famotidine 40 MG tablet Commonly known as: Pepcid Take 1 tablet (40 mg total) by mouth daily.   fluticasone 50 MCG/ACT nasal spray Commonly known as: FLONASE Place 2 sprays into both nostrils daily as needed for allergies or rhinitis.   gabapentin 800 MG tablet Commonly known as: NEURONTIN TAKE ONE TABLET BY MOUTH THREE TIMES DAILY ;;   glucose blood test strip Commonly known as: OneTouch Verio Use as instructed to check blood sugar 3 times a day   Insulin Degludec 200 UNIT/ML Sopn Commonly known as: [place] FlexTouch Inject 20 Units into the skin daily.   ipratropium-albuterol 0.5-2.5 (3) MG/3ML Soln Commonly known as: DUONEB Take 3 mLs by nebulization every 6 (six) hours as needed. What changed: reasons to take this   isosorbide  mononitrate 30 MG 24 hr tablet Commonly known as: IMDUR Take 0.5 tablets (15 mg total) by mouth daily. *NEEDS OFFICE VISIT*   lidocaine-prilocaine cream Commonly known as: EMLA Apply 1 application topically as needed. What changed: reasons to take this   metFORMIN 500 MG tablet Commonly known as: GLUCOPHAGE Take 1 tablet (500 mg total) by mouth 2 (two) times daily with a meal.   metoprolol tartrate 25 MG tablet Commonly known as: LOPRESSOR Take 1 tablet (25 mg total) by mouth 2 (two) times daily.   nitroGLYCERIN 0.4 MG SL tablet Commonly known as: Nitrostat Place 1 tablet (0.4 mg total) under the tongue every 5 (five) minutes as needed.   Ozempic (0.25 or 0.5 MG/DOSE) 2 MG/1.5ML Sopn Generic drug: Semaglutide(0.25 or 0.5MG/DOS) Inject 0.5 mg into the skin once a week.   pantoprazole 40 MG tablet Commonly known as: PROTONIX Take 1 tablet (40 mg total) by mouth daily.   rosuvastatin 40 MG tablet Commonly known as: CRESTOR TAKE ONE TABLET BY MOUTH EVERY DAY AT  BEDTIME   sertraline 50 MG tablet Commonly known as: ZOLOFT Take 1 tablet (50 mg total) by mouth daily.   simethicone 80 MG chewable tablet Commonly known as: MYLICON Chew 1 tablet (80 mg total) by mouth 4 (four) times daily as needed for flatulence.   sucralfate 1 g tablet Commonly known as: CARAFATE TAKE 1 TABLET BY MOUTH TWICE DAILY   traMADol 50 MG tablet Commonly known as: ULTRAM TAKE TWO TABLETS BY MOUTH THREE TIMES DAILY AS NEEDED FOR MODERATE PAIN What changed:   how much to take  how to take this  when to take this  reasons to take this  additional instructions      Allergies  Allergen Reactions  . Adhesive [Tape] Other (See Comments)    Burn Skin  . Codeine Other (See Comments)    paranoid  . Zocor [Simvastatin - High Dose] Other (See Comments)    Muscle spasms  . Penicillins Rash    Has patient had a PCN reaction causing immediate rash, facial/tongue/throat swelling, SOB or  lightheadedness with hypotension: Yes Has patient had a PCN reaction causing severe rash involving mucus membranes or skin necrosis: No Has patient had a PCN reaction that required hospitalization does not remember.  Has patient had a PCN reaction occurring within the last 10 years: No If all of the above answers are "NO", then may proceed with Cephalosporin use.       The results of significant diagnostics from this hospitalization (including imaging, microbiology, ancillary and laboratory) are listed below for reference.    Significant Diagnostic Studies: Dg Chest 2 View  Result Date: 05/12/2019 CLINICAL DATA:  Chest pain EXAM: CHEST - 2 VIEW COMPARISON:  None. FINDINGS: The heart size and mediastinal contours are within normal limits. A right-sided MediPort catheter seen with the tip in mid SVC. Both lungs are clear. The visualized skeletal structures are unremarkable. IMPRESSION: No acute cardiopulmonary process. Electronically Signed   By: Prudencio Pair M.D.   On: 05/12/2019 23:42   Dg Knee Complete 4 Views Left  Result Date: 04/28/2019 CLINICAL DATA:  Fall, left knee pain. EXAM: LEFT KNEE - COMPLETE 4+ VIEW COMPARISON:  03/07/2019 FINDINGS: No periprosthetic fracture in the femur or tibia. There is some mild cortical discontinuity in the proximal fibular metaphysis which appears increased on the frontal projection compared 03/07/2019 suspicious for a nondisplaced proximal fibular fracture. Expected postoperative appearance of the patella. IMPRESSION: 1. Subtle cortical discontinuity along the proximal fibular metaphysis suspicious for a nondisplaced fracture on the frontal projection. 2. No tibial or femoral fracture, or specific complicating feature of the prosthetic identified. Electronically Signed   By: Van Clines M.D.   On: 04/28/2019 15:24    Microbiology: Recent Results (from the past 240 hour(s))  SARS CORONAVIRUS 2 (TAT 6-24 HRS) Nasopharyngeal Nasopharyngeal Swab      Status: None   Collection Time: 05/13/19 12:10 AM   Specimen: Nasopharyngeal Swab  Result Value Ref Range Status   SARS Coronavirus 2 NEGATIVE NEGATIVE Final    Comment: (NOTE) SARS-CoV-2 target nucleic acids are NOT DETECTED. The SARS-CoV-2 RNA is generally detectable in upper and lower respiratory specimens during the acute phase of infection. Negative results do not preclude SARS-CoV-2 infection, do not rule out co-infections with other pathogens, and should not be used as the sole basis for treatment or other patient management decisions. Negative results must be combined with clinical observations, patient history, and epidemiological information. The expected result is Negative. Fact Sheet for Patients: SugarRoll.be Fact  Sheet for Healthcare Providers: https://www.woods-mathews.com/ This test is not yet approved or cleared by the [place] FDA and  has been authorized for detection and/or diagnosis of SARS-CoV-2 by FDA under an Emergency Use Authorization (EUA). This EUA will remain  in effect (meaning this test can be used) for the duration of the COVID-19 declaration under Section 56 4(b)(1) of the Act, 21 U.S.C. section 360bbb-3(b)(1), unless the authorization is terminated or revoked sooner. Performed at [facility], [place] [address] [place], [place] [address]      Labs: Basic Metabolic Panel: Recent Labs  Lab 05/12/19 2259  NA 139  K 4.8  CL 110  CO2 19*  GLUCOSE 441*  BUN 19  CREATININE 1.56*  CALCIUM 8.3*   Liver Function Tests: Recent Labs  Lab 05/12/19 2259  AST 34  ALT 28  ALKPHOS 100  BILITOT 0.5  PROT 5.7*  ALBUMIN 2.4*   Recent Labs  Lab 05/12/19 2259  LIPASE 17   No results for input(s): AMMONIA in the last 168 hours. CBC: Recent Labs  Lab 05/12/19 2259  WBC 1.8*  NEUTROABS 1.0*  HGB 11.5*  HCT 35.8*  MCV 90.6  PLT 260   Cardiac Enzymes: No results for input(s): CKTOTAL,  CKMB, CKMBINDEX, TROPONINI in the last 168 hours. BNP: BNP (last 3 results) No results for input(s): BNP in the last 8760 hours.  ProBNP (last 3 results) No results for input(s): PROBNP in the last 8760 hours.  CBG: Recent Labs  Lab 05/12/19 2258 05/13/19 0700  GLUCAP 380* 234*       Signed:  Radene Gunning NP [facility] 05/13/2019, 2:09 PM

## 2019-05-13 NOTE — ED Notes (Signed)
Cards at bedside.  State it's ok for pt to eat.  Diet ordered.

## 2019-05-13 NOTE — ED Notes (Signed)
Pt ambulated to bathroom with walker.

## 2019-05-13 NOTE — Plan of Care (Signed)
Initiated Care Plan  Problem: Education: Goal: Knowledge of General Education information will improve Description: Including pain rating scale, medication(s)/side effects and non-pharmacologic comfort measures Outcome: Progressing   Problem: Health Behavior/Discharge Planning: Goal: Ability to manage health-related needs will improve Outcome: Progressing   Problem: Clinical Measurements: Goal: Ability to maintain clinical measurements within normal limits will improve Outcome: Progressing Goal: Will remain free from infection Outcome: Progressing Goal: Diagnostic test results will improve Outcome: Progressing Goal: Respiratory complications will improve Outcome: Progressing Goal: Cardiovascular complication will be avoided Outcome: Progressing   Problem: Activity: Goal: Risk for activity intolerance will decrease Outcome: Progressing   Problem: Nutrition: Goal: Adequate nutrition will be maintained Outcome: Progressing   Problem: Coping: Goal: Level of anxiety will decrease Outcome: Progressing   Problem: Elimination: Goal: Will not experience complications related to bowel motility Outcome: Progressing Goal: Will not experience complications related to urinary retention Outcome: Progressing   Problem: Pain Managment: Goal: General experience of comfort will improve Outcome: Progressing   Problem: Safety: Goal: Ability to remain free from injury will improve Outcome: Progressing   Problem: Skin Integrity: Goal: Risk for impaired skin integrity will decrease Outcome: Progressing   Problem: Clinical Measurements: Goal: Postoperative complications will be avoided or minimized Outcome: Progressing   Problem: Skin Integrity: Goal: Demonstration of wound healing without infection will improve Outcome: Progressing

## 2019-05-13 NOTE — Progress Notes (Signed)
CRITICAL VALUE ALERT  Critical Value:  Troponin Critical Lab value 374   Date & Time Notied:  05/13/2019 at 14  Provider Notified: Mancel Bale PA   Orders Received/Actions taken: Pt may be Cath tomorrow, and may start on Hep. Gtt tonight.  Awting further orders at this time

## 2019-05-13 NOTE — ED Notes (Signed)
NP at bedside.  She is aware of pt's increasing troponin.  Pt denies any chest pain presently, but states headache 9/10, frontal.

## 2019-05-13 NOTE — ED Notes (Signed)
Lunch Tray Ordered @ 1156-per Hassan Rowan, RN called by Levada Dy

## 2019-05-14 ENCOUNTER — Encounter (HOSPITAL_COMMUNITY)
Admission: EM | Disposition: A | Discharge status: Home / Self Care | Payer: Self-pay | Source: Home / Self Care | Attending: Internal Medicine

## 2019-05-14 ENCOUNTER — Telehealth: Payer: Self-pay | Admitting: *Deleted

## 2019-05-14 ENCOUNTER — Observation Stay (HOSPITAL_COMMUNITY): Payer: Medicare HMO

## 2019-05-14 ENCOUNTER — Encounter (HOSPITAL_COMMUNITY): Payer: Self-pay | Admitting: Internal Medicine

## 2019-05-14 DIAGNOSIS — Z833 Family history of diabetes mellitus: Secondary | ICD-10-CM | POA: Diagnosis not present

## 2019-05-14 DIAGNOSIS — J45909 Unspecified asthma, uncomplicated: Secondary | ICD-10-CM | POA: Diagnosis not present

## 2019-05-14 DIAGNOSIS — R072 Precordial pain: Secondary | ICD-10-CM | POA: Diagnosis not present

## 2019-05-14 DIAGNOSIS — I214 Non-ST elevation (NSTEMI) myocardial infarction: Secondary | ICD-10-CM | POA: Diagnosis not present

## 2019-05-14 DIAGNOSIS — R509 Fever, unspecified: Secondary | ICD-10-CM | POA: Diagnosis not present

## 2019-05-14 DIAGNOSIS — N183 Chronic kidney disease, stage 3 unspecified: Secondary | ICD-10-CM

## 2019-05-14 DIAGNOSIS — K219 Gastro-esophageal reflux disease without esophagitis: Secondary | ICD-10-CM | POA: Diagnosis not present

## 2019-05-14 DIAGNOSIS — I25118 Atherosclerotic heart disease of native coronary artery with other forms of angina pectoris: Secondary | ICD-10-CM | POA: Diagnosis not present

## 2019-05-14 DIAGNOSIS — R296 Repeated falls: Secondary | ICD-10-CM | POA: Diagnosis not present

## 2019-05-14 DIAGNOSIS — Z20828 Contact with and (suspected) exposure to other viral communicable diseases: Secondary | ICD-10-CM | POA: Diagnosis not present

## 2019-05-14 DIAGNOSIS — R6510 Systemic inflammatory response syndrome (SIRS) of non-infectious origin without acute organ dysfunction: Secondary | ICD-10-CM | POA: Diagnosis not present

## 2019-05-14 DIAGNOSIS — E1165 Type 2 diabetes mellitus with hyperglycemia: Secondary | ICD-10-CM | POA: Diagnosis not present

## 2019-05-14 DIAGNOSIS — D573 Sickle-cell trait: Secondary | ICD-10-CM | POA: Diagnosis not present

## 2019-05-14 DIAGNOSIS — R739 Hyperglycemia, unspecified: Secondary | ICD-10-CM

## 2019-05-14 DIAGNOSIS — E785 Hyperlipidemia, unspecified: Secondary | ICD-10-CM

## 2019-05-14 DIAGNOSIS — Z96652 Presence of left artificial knee joint: Secondary | ICD-10-CM | POA: Diagnosis present

## 2019-05-14 DIAGNOSIS — Z79899 Other long term (current) drug therapy: Secondary | ICD-10-CM | POA: Diagnosis not present

## 2019-05-14 DIAGNOSIS — D709 Neutropenia, unspecified: Secondary | ICD-10-CM | POA: Diagnosis present

## 2019-05-14 DIAGNOSIS — I129 Hypertensive chronic kidney disease with stage 1 through stage 4 chronic kidney disease, or unspecified chronic kidney disease: Secondary | ICD-10-CM | POA: Diagnosis present

## 2019-05-14 DIAGNOSIS — Z9221 Personal history of antineoplastic chemotherapy: Secondary | ICD-10-CM | POA: Diagnosis not present

## 2019-05-14 DIAGNOSIS — I2510 Atherosclerotic heart disease of native coronary artery without angina pectoris: Secondary | ICD-10-CM | POA: Diagnosis present

## 2019-05-14 DIAGNOSIS — Z96649 Presence of unspecified artificial hip joint: Secondary | ICD-10-CM | POA: Diagnosis present

## 2019-05-14 DIAGNOSIS — R079 Chest pain, unspecified: Secondary | ICD-10-CM | POA: Diagnosis not present

## 2019-05-14 DIAGNOSIS — Z8673 Personal history of transient ischemic attack (TIA), and cerebral infarction without residual deficits: Secondary | ICD-10-CM | POA: Diagnosis not present

## 2019-05-14 DIAGNOSIS — G4730 Sleep apnea, unspecified: Secondary | ICD-10-CM | POA: Diagnosis present

## 2019-05-14 DIAGNOSIS — I252 Old myocardial infarction: Secondary | ICD-10-CM | POA: Diagnosis not present

## 2019-05-14 DIAGNOSIS — Z7902 Long term (current) use of antithrombotics/antiplatelets: Secondary | ICD-10-CM | POA: Diagnosis not present

## 2019-05-14 DIAGNOSIS — Z85028 Personal history of other malignant neoplasm of stomach: Secondary | ICD-10-CM | POA: Diagnosis not present

## 2019-05-14 DIAGNOSIS — E1122 Type 2 diabetes mellitus with diabetic chronic kidney disease: Secondary | ICD-10-CM | POA: Diagnosis not present

## 2019-05-14 LAB — BASIC METABOLIC PANEL
Anion gap: 9 (ref 5–15)
BUN: 21 mg/dL (ref 8–23)
CO2: 20 mmol/L — ABNORMAL LOW (ref 22–32)
Calcium: 8.5 mg/dL — ABNORMAL LOW (ref 8.9–10.3)
Chloride: 112 mmol/L — ABNORMAL HIGH (ref 98–111)
Creatinine, Ser: 1.4 mg/dL — ABNORMAL HIGH (ref 0.44–1.00)
GFR calc Af Amer: 45 mL/min — ABNORMAL LOW (ref >60)
GFR calc non Af Amer: 39 mL/min — ABNORMAL LOW (ref >60)
Glucose, Bld: 172 mg/dL — ABNORMAL HIGH (ref 70–99)
Potassium: 4.5 mmol/L (ref 3.5–5.1)
Sodium: 141 mmol/L (ref 135–145)

## 2019-05-14 LAB — CBC
HCT: 33.5 % — ABNORMAL LOW (ref 36.0–46.0)
Hemoglobin: 11.4 g/dL — ABNORMAL LOW (ref 12.0–15.0)
MCH: 29 pg (ref 26.0–34.0)
MCHC: 34 g/dL (ref 30.0–36.0)
MCV: 85.2 fL (ref 80.0–100.0)
Platelets: 255 10*3/uL (ref 150–400)
RBC: 3.93 MIL/uL (ref 3.87–5.11)
RDW: 17.2 % — ABNORMAL HIGH (ref 11.5–15.5)
WBC: 9.1 10*3/uL (ref 4.0–10.5)
nRBC: 0 % (ref 0.0–0.2)

## 2019-05-14 LAB — GLUCOSE, CAPILLARY
Glucose-Capillary: 112 mg/dL — ABNORMAL HIGH (ref 70–99)
Glucose-Capillary: 137 mg/dL — ABNORMAL HIGH (ref 70–99)
Glucose-Capillary: 143 mg/dL — ABNORMAL HIGH (ref 70–99)
Glucose-Capillary: 149 mg/dL — ABNORMAL HIGH (ref 70–99)
Glucose-Capillary: 153 mg/dL — ABNORMAL HIGH (ref 70–99)
Glucose-Capillary: 169 mg/dL — ABNORMAL HIGH (ref 70–99)
Glucose-Capillary: 266 mg/dL — ABNORMAL HIGH (ref 70–99)

## 2019-05-14 LAB — HEPATIC FUNCTION PANEL
ALT: 26 U/L (ref 0–44)
AST: 28 U/L (ref 15–41)
Albumin: 2.4 g/dL — ABNORMAL LOW (ref 3.5–5.0)
Alkaline Phosphatase: 88 U/L (ref 38–126)
Bilirubin, Direct: <0.1 mg/dL (ref 0.0–0.2)
Total Bilirubin: 0.3 mg/dL (ref 0.3–1.2)
Total Protein: 5.9 g/dL — ABNORMAL LOW (ref 6.5–8.1)

## 2019-05-14 LAB — CBC WITH DIFFERENTIAL/PLATELET
Abs Immature Granulocytes: 0.04 10*3/uL (ref 0.00–0.07)
Basophils Absolute: 0 10*3/uL (ref 0.0–0.1)
Basophils Relative: 0 %
Eosinophils Absolute: 0.5 10*3/uL (ref 0.0–0.5)
Eosinophils Relative: 6 %
HCT: 33.8 % — ABNORMAL LOW (ref 36.0–46.0)
Hemoglobin: 11.2 g/dL — ABNORMAL LOW (ref 12.0–15.0)
Immature Granulocytes: 0 %
Lymphocytes Relative: 7 %
Lymphs Abs: 0.7 10*3/uL (ref 0.7–4.0)
MCH: 28.8 pg (ref 26.0–34.0)
MCHC: 33.1 g/dL (ref 30.0–36.0)
MCV: 86.9 fL (ref 80.0–100.0)
Monocytes Absolute: 0.7 10*3/uL (ref 0.1–1.0)
Monocytes Relative: 8 %
Neutro Abs: 7.1 10*3/uL (ref 1.7–7.7)
Neutrophils Relative %: 79 %
Platelets: 263 10*3/uL (ref 150–400)
RBC: 3.89 MIL/uL (ref 3.87–5.11)
RDW: 17.4 % — ABNORMAL HIGH (ref 11.5–15.5)
WBC: 9 10*3/uL (ref 4.0–10.5)
nRBC: 0 % (ref 0.0–0.2)

## 2019-05-14 LAB — HEMOGLOBIN A1C
Hgb A1c MFr Bld: 10 % — ABNORMAL HIGH (ref 4.8–5.6)
Mean Plasma Glucose: 240 mg/dL

## 2019-05-14 LAB — PROCALCITONIN: Procalcitonin: 7.57 ng/mL

## 2019-05-14 LAB — TROPONIN I (HIGH SENSITIVITY): Troponin I (High Sensitivity): 395 ng/L (ref <18)

## 2019-05-14 SURGERY — LEFT HEART CATH AND CORONARY ANGIOGRAPHY
Anesthesia: LOCAL

## 2019-05-14 MED ORDER — SODIUM CHLORIDE 0.9 % IV SOLN: INTRAVENOUS | Status: DC

## 2019-05-14 MED ORDER — SODIUM CHLORIDE 0.9 % IV SOLN
INTRAVENOUS | Status: AC
  Administered 2019-05-14: 16:00:00 via INTRAVENOUS

## 2019-05-14 MED ORDER — FAMOTIDINE 20 MG PO TABS
40.0000 mg | ORAL_TABLET | Freq: Every day | ORAL | Status: DC
  Administered 2019-05-14 – 2019-05-16 (×3): 40 mg via ORAL
  Filled 2019-05-14: qty 2
  Filled 2019-05-14 (×2): qty 1
  Filled 2019-05-14: qty 2

## 2019-05-14 MED ORDER — GABAPENTIN 300 MG PO CAPS
600.0000 mg | ORAL_CAPSULE | Freq: Three times a day (TID) | ORAL | Status: DC
  Administered 2019-05-14 – 2019-05-16 (×6): 600 mg via ORAL
  Filled 2019-05-14 (×6): qty 2

## 2019-05-14 MED ORDER — FAMOTIDINE 20 MG PO TABS
40.0000 mg | ORAL_TABLET | Freq: Two times a day (BID) | ORAL | Status: DC
  Filled 2019-05-14: qty 2

## 2019-05-14 NOTE — Progress Notes (Signed)
[facility] PROGRESS NOTE  IVI GRIFFITH ASN:[id number] DOB: February 22, 1951 DOA: 05/12/2019 PCP: Ann Held, DO  Assessment/Plan:  Fever/sirs. Max temp last night 102.8. procalcitonin 7.5. etiology unclear.  chest xray unremarkable, awaiting urinalyis. BP low end of normal, intermittent tachycardia and tachypnea. She complained one episode abdominal cramping with diarrhea yesterday.  No leukocytosis She is non-toxic appearing.  -gentle IV fluids -follow urinalysis -obtain blood cultures -stool studies -if further cramping/diarrhea obtain kub -monitor closely  Chest pain/NSTEMI. Hx cad s/p PCI with  of DES to RCA on 08/29/18.Troponin 6>>9>>34>>374 >>395.No significant EKG change. Chest xray unremarkable. Evaluated by cardiology who recommended cardiac cath. This cancelled due to fever/sirs.  -continue asa -cont crestor -tele -supportive therapy  Hyperlipidemia:Crestor  Asthma: stableat baseline.Atrovent inhaler, albuterol inhaler as needed  GERD (gastroesophageal reflux disease):Stable. Continue home meds.Protonix and Pepcid  History of CVA (cerebrovascular accident)and TIA:  -on ASA, plavix and Crestor  Type 2 diabetes with nephropathy (HCC):Poorly controlled. Home meds includetakingTresiba, metformin, Ozempic.Blood sugar 441.Anion gap 10.CBG 444. Chart review indicates hx non-compliance -lantus -ssi for optimal control -follow hga1c -diabetes coordinator - OP follow up with PCP for optimal control.  CKD (chronic kidney disease) stage 3, GFR 30-59 ml/min (HCC):Baseline creatinine 1.0-1.3. Her creatinine trending down to 1.4 this am.  -hold nephrotoxins as able -gentle iv fluids -monitor intake and output -Repeat bmet  Neutropenia ([place]): resolved this am.Unclear etiology. No fever.Differential unremarkable - OP follow up   Code Status: full Family Communication: patient Disposition Plan: home when  ready   Consultants:  [facility] cardiology  Procedures:    Antibiotics:    HPI/Subjective: Awake alert denies pain/discomfort  Objective: Vitals:   05/14/19 0800 05/14/19 1025  BP: 103/63 96/69  Pulse: 91 83  Resp: 18 17  Temp:  99.3 F (37.4 C)  SpO2:  100%    Intake/Output Summary (Last 24 hours) at 05/14/2019 1303 Last data filed at 05/14/2019 1000 Gross per 24 hour  Intake 1003 ml  Output 2 ml  Net 1001 ml   Filed Weights   05/12/19 2249 05/13/19 1612  Weight: 96.6 kg 97.7 kg    Exam:   General:  Well nourished, alert no acute distress  Cardiovascular: rrr no mgr trace LE edema  Respiratory: normal effort BS clear bilaterally no wheeze  Abdomen: obese soft +BS no guarding or rebounding  Musculoskeletal: joints without swelling/erythema   Data Reviewed: Basic Metabolic Panel: Recent Labs  Lab 05/12/19 2259 05/13/19 1516 05/14/19 0539  NA 139 140 141  K 4.8 4.8 4.5  CL 110 110 112*  CO2 19* 23 20*  GLUCOSE 441* 261* 172*  BUN 19 21 21   CREATININE 1.56* 1.62* 1.40*  CALCIUM 8.3* 8.2* 8.5*   Liver Function Tests: Recent Labs  Lab 05/12/19 2259  AST 34  ALT 28  ALKPHOS 100  BILITOT 0.5  PROT 5.7*  ALBUMIN 2.4*   Recent Labs  Lab 05/12/19 2259  LIPASE 17   No results for input(s): AMMONIA in the last 168 hours. CBC: Recent Labs  Lab 05/12/19 2259 05/14/19 0539  WBC 1.8* 9.0  9.1  NEUTROABS 1.0* 7.1  HGB 11.5* 11.2*  11.4*  HCT 35.8* 33.8*  33.5*  MCV 90.6 86.9  85.2  PLT 260 263  255   Cardiac Enzymes: No results for input(s): CKTOTAL, CKMB, CKMBINDEX, TROPONINI in the last 168 hours. BNP (last 3 results) No results for input(s): BNP in the last 8760 hours.  ProBNP (last 3 results) No results for input(s):  PROBNP in the last 8760 hours.  CBG: Recent Labs  Lab 05/13/19 2018 05/13/19 2359 05/14/19 0358 05/14/19 0841 05/14/19 1022  GLUCAP 167* 149* 143* 137* 112*    Recent Results (from the past 240  hour(s))  SARS CORONAVIRUS 2 (TAT 6-24 HRS) Nasopharyngeal Nasopharyngeal Swab     Status: None   Collection Time: 05/13/19 12:10 AM   Specimen: Nasopharyngeal Swab  Result Value Ref Range Status   SARS Coronavirus 2 NEGATIVE NEGATIVE Final    Comment: (NOTE) SARS-CoV-2 target nucleic acids are NOT DETECTED. The SARS-CoV-2 RNA is generally detectable in upper and lower respiratory specimens during the acute phase of infection. Negative results do not preclude SARS-CoV-2 infection, do not rule out co-infections with other pathogens, and should not be used as the sole basis for treatment or other patient management decisions. Negative results must be combined with clinical observations, patient history, and epidemiological information. The expected result is Negative. Fact Sheet for Patients: SugarRoll.be Fact Sheet for Healthcare Providers: https://www.woods-mathews.com/ This test is not yet approved or cleared by the [place] FDA and  has been authorized for detection and/or diagnosis of SARS-CoV-2 by FDA under an Emergency Use Authorization (EUA). This EUA will remain  in effect (meaning this test can be used) for the duration of the COVID-19 declaration under Section 56 4(b)(1) of the Act, 21 U.S.C. section 360bbb-3(b)(1), unless the authorization is terminated or revoked sooner. Performed at [facility], [place] [address], [place], [place] [address]   MRSA PCR Screening     Status: None   Collection Time: 05/13/19  4:12 PM   Specimen: Nasal Mucosa; Nasopharyngeal  Result Value Ref Range Status   MRSA by PCR NEGATIVE NEGATIVE Final    Comment:        The GeneXpert MRSA Assay (FDA approved for NASAL specimens only), is one component of a comprehensive MRSA colonization surveillance program. It is not intended to diagnose MRSA infection nor to guide or monitor treatment for MRSA infections. Performed at [facility], [place] [address] [place], [place] [address]      Studies: Dg Chest 2 View  Result Date: 05/14/2019 CLINICAL DATA:  Fever. EXAM: CHEST - 2 VIEW COMPARISON:  None. FINDINGS: The heart size and mediastinal contours are within normal limits. Right internal jugular Port-A-Cath is noted with tip in expected position of the SVC. Both lungs are clear. The visualized skeletal structures are unremarkable. IMPRESSION: No active cardiopulmonary disease. Electronically Signed   By: Marijo Conception M.D.   On: 05/14/2019 12:53   Dg Chest 2 View  Result Date: 05/12/2019 CLINICAL DATA:  Chest pain EXAM: CHEST - 2 VIEW COMPARISON:  None. FINDINGS: The heart size and mediastinal contours are within normal limits. A right-sided MediPort catheter seen with the tip in mid SVC. Both lungs are clear. The visualized skeletal structures are unremarkable. IMPRESSION: No acute cardiopulmonary process. Electronically Signed   By: Prudencio Pair M.D.   On: 05/12/2019 23:42    Scheduled Meds: . aspirin EC  81 mg Oral Daily  . clopidogrel  75 mg Oral Daily  . enoxaparin (LOVENOX) injection  40 mg Subcutaneous Q24H  . famotidine  40 mg Oral Daily  . gabapentin  800 mg Oral TID  . insulin aspart  0-9 Units Subcutaneous Q4H  . insulin glargine  20 Units Subcutaneous Daily  . metoprolol tartrate  25 mg Oral BID  . multivitamin with minerals  1 tablet Oral Daily  . pantoprazole  40 mg Oral Daily  . rosuvastatin  40 mg Oral QHS  . sertraline  50 mg Oral Daily  . sodium chloride flush  3 mL Intravenous Once  . sodium chloride flush  3 mL Intravenous Q12H  . sucralfate  1 g Oral BID   Continuous Infusions: . sodium chloride    . sodium chloride    . sodium chloride 1 mL/kg/hr (05/14/19 0503)    Principal Problem:   Chest pain Active Problems:   CKD (chronic kidney disease) stage 3, GFR 30-59 ml/min (HCC)   SIRS (systemic inflammatory response syndrome) (HCC)   NSTEMI (non-ST elevated myocardial infarction) (HCC)    Hyperlipidemia   GERD (gastroesophageal reflux disease)   Type 2 diabetes with nephropathy (HCC)   CAD (coronary artery disease)   Asthma   History of CVA (cerebrovascular accident)   Neutropenia ([place])    Time spent: 84 minutes    [facility] M NP [facility]  If 7PM-7AM, please contact night-coverage at [email or web address], password [facility] 05/14/2019, 1:03 PM  LOS: 0 days

## 2019-05-14 NOTE — Progress Notes (Addendum)
The patient has been seen in conjunction with Nathanial Rancher, MD. All aspects of care have been considered and discussed. The patient has been personally interviewed, examined, and all clinical data has been reviewed.   We do not believe this is acute coronary syndrome and plan conservative management without further work up unless clinical status changes.  Progress Note  Patient Name: [name] Date of Encounter: 05/14/2019  Primary Cardiologist: Glenetta Hew, MD   Subjective   Overnight: Febrile with T-max of 102.8.  She did receive Tylenol.  Today, she reports that she is doing well and currently denies chest pain or shortness of breath.  She does endorse chills however denies cough, dysuria.  She did mention of having an episode of diarrhea yesterday evening with abdominal cramps.  Inpatient Medications    Scheduled Meds: . aspirin EC  81 mg Oral Daily  . clopidogrel  75 mg Oral Daily  . enoxaparin (LOVENOX) injection  40 mg Subcutaneous Q24H  . famotidine  40 mg Oral Daily  . gabapentin  800 mg Oral TID  . insulin aspart  0-9 Units Subcutaneous Q4H  . insulin glargine  20 Units Subcutaneous Daily  . isosorbide mononitrate  15 mg Oral Daily  . metoprolol tartrate  25 mg Oral BID  . multivitamin with minerals  1 tablet Oral Daily  . pantoprazole  40 mg Oral Daily  . rosuvastatin  40 mg Oral QHS  . sertraline  50 mg Oral Daily  . sodium chloride flush  3 mL Intravenous Once  . sodium chloride flush  3 mL Intravenous Q12H  . sucralfate  1 g Oral BID   Continuous Infusions: . sodium chloride    . sodium chloride 1 mL/kg/hr (05/14/19 0503)   PRN Meds: sodium chloride, acetaminophen, albuterol, dextromethorphan-guaiFENesin, fluticasone, hydrALAZINE, morphine injection, nitroGLYCERIN, ondansetron (ZOFRAN) IV, sodium chloride flush, traMADol   Vital Signs    Vitals:   05/13/19 2300 05/14/19 0405 05/14/19 0406 05/14/19 0503  BP: 114/66 (!) 114/96    Pulse: 79      Resp: 18 (!) 28 17   Temp: 98.2 F (36.8 C) (!) 102.8 F (39.3 C)  (!) 101.6 F (38.7 C)  TempSrc: Axillary Oral  Oral  SpO2: 98%     Weight:      Height:        Intake/Output Summary (Last 24 hours) at 05/14/2019 4825 Last data filed at 05/13/2019 1502 Gross per 24 hour  Intake 1000 ml  Output -  Net 1000 ml   Filed Weights   05/12/19 2249 05/13/19 1612  Weight: 96.6 kg 97.7 kg    Telemetry    Unremarkable- Personally Reviewed  ECG    Pending- Personally Reviewed  Physical Exam   GEN: No acute distress.   Cardiac: RRR, no murmurs, rubs, or gallops.  Respiratory: Clear to auscultation bilaterally. GI: Soft, nontender, non-distended  MS: No edema; No deformity. Neuro:  Nonfocal  Psych: Normal affect   Labs    Chemistry Recent Labs  Lab 05/12/19 2259 05/13/19 1516  NA 139 140  K 4.8 4.8  CL 110 110  CO2 19* 23  GLUCOSE 441* 261*  BUN 19 21  CREATININE 1.56* 1.62*  CALCIUM 8.3* 8.2*  PROT 5.7*  --   ALBUMIN 2.4*  --   AST 34  --   ALT 28  --   ALKPHOS 100  --   BILITOT 0.5  --   GFRNONAA 34* 33*  GFRAA 39* 38*  ANIONGAP 10 7     Hematology Recent Labs  Lab 05/12/19 2259 05/14/19 0539  WBC 1.8* 9.1  RBC 3.95 3.93  HGB 11.5* 11.4*  HCT 35.8* 33.5*  MCV 90.6 85.2  MCH 29.1 29.0  MCHC 32.1 34.0  RDW 17.5* 17.2*  PLT 260 255    Cardiac EnzymesNo results for input(s): TROPONINI in the last 168 hours. No results for input(s): TROPIPOC in the last 168 hours.   BNPNo results for input(s): BNP, PROBNP in the last 168 hours.   DDimer No results for input(s): DDIMER in the last 168 hours.   Radiology    Dg Chest 2 View  Result Date: 05/12/2019 CLINICAL DATA:  Chest pain EXAM: CHEST - 2 VIEW COMPARISON:  None. FINDINGS: The heart size and mediastinal contours are within normal limits. A right-sided MediPort catheter seen with the tip in mid SVC. Both lungs are clear. The visualized skeletal structures are unremarkable. IMPRESSION: No  acute cardiopulmonary process. Electronically Signed   By: Prudencio Pair M.D.   On: 05/12/2019 23:42    Cardiac Studies   08/27/2018-left heart cath   The left ventricular systolic function is normal.  LV end diastolic pressure is normal.  The left ventricular ejection fraction is greater than 65% by visual estimate.  There is trivial (1+) mitral regurgitation.  There is no aortic valve stenosis.  Prox RCA to Mid RCA lesion is 100% stenosed.  Post intervention, there is a 0% residual stenosis.  A drug-eluting stent was successfully placed using a [employer] [id number].  Prox RCA lesion is 20% stenosed.  Prox Cx lesion is 90% stenosed.  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  Normal LVEDP.   08/28/2018-echocardiogram 1. The left ventricle has low normal systolic function of 88-28%. The cavity size is normal. There is severely increased left ventricular wall thickness of the septal wall. Normal left ventricular filling pressures. The left ventricular diastology could not be evaluated due to indeterminent diastolic function. There is severe akinesis of the basiliar-mid inferior left ventricular segment. 2. There is mild dilatation of the ascending aorta. 3. Ao Asc diam:3.75 cm. 4. The tricuspid valve is normal in structure. Regurgitation is mild by color flow Doppler. 5. The mitral valve is normal in structure Regurgitation is trivial by color flow Doppler. 6. Compared to prior echo, there is now mid and basal inferior akinesis. The endocardial segments are not well visualized and Definity contrast would be helpful to delineate if this is a new wall motion abnormality.   08/29/2018-left heart cath  Previously placed Prox RCA to Mid RCA drug eluting stent is widely patent.  Balloon  angioplasty was performed.  Prox RCA lesion is 20% stenosed.  Prox Cx to Mid Cx lesion is 90% stenosed.  Post intervention, there is a 0% residual stenosis.  A drug-eluting stent was successfully placed using a STENT RESOLUTE ONYX [id number].  Successful angioplasty and drug-eluting stent placement to the mid left circumflex via the right radial artery.  Patient Profile     [name] is a 68 y.o. 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  Given her new fever, would likely hold on cardiac catheterization until she is fever free for at least 24 hours as we want to air on the side of caution -Continue aspirin, Plavix, Crestor -As needed nitroglycerin -Continue cardiac monitoring   #GERD -Continue Pepcid, Protonix  #Hyperlipidemia LDL of 18, triglycerides of 54 -Continue Crestor  #History of TIA -Continue aspirin, Plavix, Crestor  #Type 2 diabetes mellitus A1c of 10% -Continue insulin  #Hypertension BP labile. Advised to hold Imdur  -Continue metoprolol  #+SIRS Overnight, she had 2 febrile episodes with a T-max of 102.8 F.  She had an episode of tachycardia to 102 and tachypnea to the 20s.  CBC does not reveal leukocytosis.  This could be an indication of ongoing myocardial damage.  Given her episode of diarrhea yesterday, I am suspecting possible gastroenteritis.  She currently denies any signs or symptoms of UTI -Will defer antibiotics to primary team -Follow-up urinalysis, procalcitonin  #CKD Stage III Serum creatinine  1.4<<1.6 -Monitor BNP     For questions or updates, please contact [facility] Please consult [email or web address] for contact info under Cardiology/STEMI.      Signed, Jean Rosenthal, MD  05/14/2019, 6:27 AM

## 2019-05-14 NOTE — Progress Notes (Signed)
Pt refused morning labs. Does not want to be stuck anywhere but her [facility] and she has an IV in each side.

## 2019-05-14 NOTE — Telephone Encounter (Signed)
Call received from patient to inform Dr. Marin Olp that she is currently in the hospital.  Informed patient that Dr. Marin Olp is out of the office this week and that I would inform S. [place] NP of her admission.  Jory Ee NP notified.

## 2019-05-14 NOTE — Evaluation (Signed)
Physical Therapy Evaluation Patient Details Name: [name] MRN: [id number] DOB: Apr 19, 1951 Today's Date: 05/14/2019   History of Present Illness  Pt is a 68 y.o. female admitted 05/12/19 with chest pain. CXR negative. Worked up for NSTEMI. Cardiac cath deferred due to fever 10/20. PMH includes HTN, HLD, DM, TIA, CAD, CKD III, asthma, depression, stomach cancer.    Clinical Impression  Pt presents with an overall decrease in functional mobility secondary to above. PTA, pt mod indep with rollator and lives alone; multiple children live nearby and assist as needed. Today, pt moving well with RW, limited by fatigue. Initial hypotension while supine, pt asymptomatic (see values below). Pt would benefit from continued acute PT services to maximize functional mobility and independence prior to d/c home.  Supine BP 96/69 Sitting BP 114/93 Standing BP 107/73     Follow Up Recommendations No PT follow up;Supervision - Intermittent    Equipment Recommendations  None recommended by PT    Recommendations for Other Services       Precautions / Restrictions Precautions Precautions: Fall Restrictions Weight Bearing Restrictions: No      Mobility  Bed Mobility Overal bed mobility: Modified Independent             General bed mobility comments: HOB slightly elevated  Transfers Overall transfer level: Modified independent Equipment used: Rolling walker (2 wheeled)                Ambulation/Gait Ambulation/Gait assistance: Supervision Gait Distance (Feet): 16 Feet Assistive device: Rolling walker (2 wheeled) Gait Pattern/deviations: Step-through pattern;Decreased stride length Gait velocity: Decreased   General Gait Details: Pt only agreeable to in-room ambulation due to fatigue and diarrhea; moving well with RW at supervision-level. VSS  Stairs            Wheelchair Mobility    Modified Rankin (Stroke Patients Only)       Balance Overall balance  assessment: Needs assistance   Sitting balance-Leahy Scale: Good       Standing balance-Leahy Scale: Fair Standing balance comment: Can static stand without UE support                             Pertinent Vitals/Pain Pain Assessment: No/denies pain    Home Living Family/patient expects to be discharged to:: Private residence Living Arrangements: Alone Available Help at Discharge: Family;Available 24 hours/day Type of Home: Apartment Home Access: Stairs to enter Entrance Stairs-Rails: Right Entrance Stairs-Number of Steps: 1 Home Layout: One level Home Equipment: [place] - 4 wheels Additional Comments: 81 y.o. granddaughter stays with her often    Prior Function Level of Independence: Independent with assistive device(s)         Comments: Mod indep with rollator; drives but does not have car. Children provide necessary transportation and assist as needed.     Hand Dominance        Extremity/Trunk Assessment   Upper Extremity Assessment Upper Extremity Assessment: Overall WFL for tasks assessed    Lower Extremity Assessment Lower Extremity Assessment: Overall WFL for tasks assessed       Communication   Communication: No difficulties  Cognition Arousal/Alertness: Awake/alert Behavior During Therapy: WFL for tasks assessed/performed Overall Cognitive Status: Within Functional Limits for tasks assessed  General Comments General comments (skin integrity, edema, etc.): Supine BP 96/69, sitting BP 114/93, standing BP 107/73; pt asypmtomatic    Exercises     Assessment/Plan    PT Assessment Patient needs continued PT services  PT Problem List Decreased activity tolerance;Decreased balance;Decreased mobility       PT Treatment Interventions DME instruction;Gait training;Stair training;Functional mobility training;Therapeutic activities;Therapeutic exercise;Balance training;Patient/family  education    PT Goals (Current goals can be found in the Care Plan section)  Acute Rehab PT Goals Patient Stated Goal: Return home PT Goal Formulation: With patient Time For Goal Achievement: 05/28/19 Potential to Achieve Goals: Good    Frequency Min 3X/week   Barriers to discharge        Co-evaluation               AM-PAC PT "6 Clicks" Mobility  Outcome Measure Help needed turning from your back to your side while in a flat bed without using bedrails?: None Help needed moving from lying on your back to sitting on the side of a flat bed without using bedrails?: None Help needed moving to and from a bed to a chair (including a wheelchair)?: None Help needed standing up from a chair using your arms (e.g., wheelchair or bedside chair)?: None Help needed to walk in hospital room?: A Little Help needed climbing 3-5 steps with a railing? : A Little 6 Click Score: 22    End of Session   Activity Tolerance: Patient tolerated treatment well;Patient limited by fatigue Patient left: in chair;with call bell/phone within reach;with chair alarm set Nurse Communication: Mobility status PT Visit Diagnosis: Other abnormalities of gait and mobility (R26.89)    Time: 4008-6761 PT Time Calculation (min) (ACUTE ONLY): 20 min   Charges:   PT Evaluation $PT Eval Moderate Complexity: 1 Mod     Mabeline Caras, PT, DPT Acute Rehabilitation Services  Pager [phone] Office [place] 05/14/2019, 10:56 AM

## 2019-05-14 NOTE — Progress Notes (Signed)
Pt had a fever of 102.8. Pt given tylenol and fever decreased to 101.6. Cath lab made aware and will continue to monitor.

## 2019-05-14 NOTE — Plan of Care (Signed)
  Problem: Education: Goal: Knowledge of General Education information will improve Description: Including pain rating scale, medication(s)/side effects and non-pharmacologic comfort measures Outcome: Progressing   Problem: Health Behavior/Discharge Planning: Goal: Ability to manage health-related needs will improve Outcome: Progressing   Problem: Clinical Measurements: Goal: Ability to maintain clinical measurements within normal limits will improve Outcome: Progressing Goal: Will remain free from infection Outcome: Progressing Goal: Diagnostic test results will improve Outcome: Progressing Goal: Respiratory complications will improve Outcome: Progressing Goal: Cardiovascular complication will be avoided Outcome: Progressing   Problem: Activity: Goal: Risk for activity intolerance will decrease Outcome: Progressing   Problem: Nutrition: Goal: Adequate nutrition will be maintained Outcome: Progressing   Problem: Coping: Goal: Level of anxiety will decrease Outcome: Progressing   Problem: Elimination: Goal: Will not experience complications related to bowel motility Outcome: Progressing Goal: Will not experience complications related to urinary retention Outcome: Progressing   Problem: Pain Managment: Goal: General experience of comfort will improve Outcome: Progressing   Problem: Safety: Goal: Ability to remain free from injury will improve Outcome: Progressing   Problem: Skin Integrity: Goal: Risk for impaired skin integrity will decrease Outcome: Progressing   Problem: Clinical Measurements: Goal: Postoperative complications will be avoided or minimized Outcome: Progressing   Problem: Skin Integrity: Goal: Demonstration of wound healing without infection will improve Outcome: Progressing   Problem: Education: Goal: Understanding of CV disease, CV risk reduction, and recovery process will improve Outcome: Progressing Goal: Individualized Educational  Video(s) Outcome: Progressing   Problem: Activity: Goal: Ability to return to baseline activity level will improve Outcome: Progressing   Problem: Cardiovascular: Goal: Ability to achieve and maintain adequate cardiovascular perfusion will improve Outcome: Progressing Goal: Vascular access site(s) Level 0-1 will be maintained Outcome: Progressing   Problem: Health Behavior/Discharge Planning: Goal: Ability to safely manage health-related needs after discharge will improve Outcome: Progressing

## 2019-05-14 NOTE — Plan of Care (Signed)
  Problem: Education: Goal: Knowledge of General Education information will improve Description: Including pain rating scale, medication(s)/side effects and non-pharmacologic comfort measures Outcome: Progressing   Problem: Health Behavior/Discharge Planning: Goal: Ability to manage health-related needs will improve Outcome: Progressing   Problem: Clinical Measurements: Goal: Ability to maintain clinical measurements within normal limits will improve Outcome: Progressing Goal: Will remain free from infection Outcome: Progressing Goal: Diagnostic test results will improve Outcome: Progressing Goal: Respiratory complications will improve Outcome: Progressing Goal: Cardiovascular complication will be avoided Outcome: Progressing   Problem: Activity: Goal: Risk for activity intolerance will decrease Outcome: Progressing   Problem: Nutrition: Goal: Adequate nutrition will be maintained Outcome: Progressing   Problem: Coping: Goal: Level of anxiety will decrease Outcome: Progressing   Problem: Elimination: Goal: Will not experience complications related to bowel motility Outcome: Progressing Goal: Will not experience complications related to urinary retention Outcome: Progressing   Problem: Pain Managment: Goal: General experience of comfort will improve Outcome: Progressing   Problem: Safety: Goal: Ability to remain free from injury will improve Outcome: Progressing   Problem: Clinical Measurements: Goal: Postoperative complications will be avoided or minimized Outcome: Progressing   Problem: Skin Integrity: Goal: Risk for impaired skin integrity will decrease Outcome: Progressing   Problem: Education: Goal: Understanding of CV disease, CV risk reduction, and recovery process will improve Outcome: Progressing Goal: Individualized Educational Video(s) Outcome: Progressing   Problem: Skin Integrity: Goal: Demonstration of wound healing without infection will  improve Outcome: Progressing   Problem: Activity: Goal: Ability to return to baseline activity level will improve Outcome: Progressing   Problem: Cardiovascular: Goal: Ability to achieve and maintain adequate cardiovascular perfusion will improve Outcome: Progressing Goal: Vascular access site(s) Level 0-1 will be maintained Outcome: Progressing   Problem: Health Behavior/Discharge Planning: Goal: Ability to safely manage health-related needs after discharge will improve Outcome: Progressing

## 2019-05-15 LAB — CBC
HCT: 35.8 % — ABNORMAL LOW (ref 36.0–46.0)
Hemoglobin: 11.8 g/dL — ABNORMAL LOW (ref 12.0–15.0)
MCH: 28.9 pg (ref 26.0–34.0)
MCHC: 33 g/dL (ref 30.0–36.0)
MCV: 87.5 fL (ref 80.0–100.0)
Platelets: 275 10*3/uL (ref 150–400)
RBC: 4.09 MIL/uL (ref 3.87–5.11)
RDW: 17.6 % — ABNORMAL HIGH (ref 11.5–15.5)
WBC: 5.8 10*3/uL (ref 4.0–10.5)
nRBC: 0 % (ref 0.0–0.2)

## 2019-05-15 LAB — URINALYSIS, ROUTINE W REFLEX MICROSCOPIC
Bilirubin Urine: NEGATIVE
Glucose, UA: NEGATIVE mg/dL
Hgb urine dipstick: NEGATIVE
Ketones, ur: NEGATIVE mg/dL
Leukocytes,Ua: NEGATIVE
Nitrite: NEGATIVE
Protein, ur: NEGATIVE mg/dL
Specific Gravity, Urine: 1.008 (ref 1.005–1.030)
pH: 6 (ref 5.0–8.0)

## 2019-05-15 LAB — C DIFFICILE QUICK SCREEN W PCR REFLEX
C Diff antigen: NEGATIVE
C Diff interpretation: NOT DETECTED
C Diff toxin: NEGATIVE

## 2019-05-15 LAB — BASIC METABOLIC PANEL
Anion gap: 8 (ref 5–15)
BUN: 17 mg/dL (ref 8–23)
CO2: 21 mmol/L — ABNORMAL LOW (ref 22–32)
Calcium: 8.8 mg/dL — ABNORMAL LOW (ref 8.9–10.3)
Chloride: 113 mmol/L — ABNORMAL HIGH (ref 98–111)
Creatinine, Ser: 1.57 mg/dL — ABNORMAL HIGH (ref 0.44–1.00)
GFR calc Af Amer: 39 mL/min — ABNORMAL LOW (ref >60)
GFR calc non Af Amer: 34 mL/min — ABNORMAL LOW (ref >60)
Glucose, Bld: 121 mg/dL — ABNORMAL HIGH (ref 70–99)
Potassium: 5.6 mmol/L — ABNORMAL HIGH (ref 3.5–5.1)
Sodium: 142 mmol/L (ref 135–145)

## 2019-05-15 LAB — PATHOLOGIST SMEAR REVIEW

## 2019-05-15 LAB — GLUCOSE, CAPILLARY
Glucose-Capillary: 98 mg/dL (ref 70–99)
Glucose-Capillary: 224 mg/dL — ABNORMAL HIGH (ref 70–99)
Glucose-Capillary: 159 mg/dL — ABNORMAL HIGH (ref 70–99)
Glucose-Capillary: 172 mg/dL — ABNORMAL HIGH (ref 70–99)
Glucose-Capillary: 149 mg/dL — ABNORMAL HIGH (ref 70–99)

## 2019-05-15 LAB — PROCALCITONIN: Procalcitonin: 4.94 ng/mL

## 2019-05-15 LAB — POTASSIUM
Potassium: 4.5 mmol/L (ref 3.5–5.1)
Potassium: 4.6 mmol/L (ref 3.5–5.1)

## 2019-05-15 MED ORDER — LIDOCAINE 4 % EX CREA
TOPICAL_CREAM | Freq: Once | CUTANEOUS | Status: AC
  Administered 2019-05-15: 1 via TOPICAL
  Filled 2019-05-15: qty 5

## 2019-05-15 MED ORDER — INSULIN ASPART 100 UNIT/ML ~~LOC~~ SOLN
0.0000 [IU] | Freq: Three times a day (TID) | SUBCUTANEOUS | Status: DC
  Administered 2019-05-15: 2 [IU] via SUBCUTANEOUS

## 2019-05-15 MED ORDER — SODIUM CHLORIDE 0.9% FLUSH
10.0000 mL | INTRAVENOUS | Status: DC | PRN
  Administered 2019-05-16: 10 mL
  Filled 2019-05-15: qty 40

## 2019-05-15 MED ORDER — INSULIN GLARGINE 100 UNIT/ML ~~LOC~~ SOLN
22.0000 [IU] | Freq: Every day | SUBCUTANEOUS | Status: DC
  Administered 2019-05-16: 22 [IU] via SUBCUTANEOUS
  Filled 2019-05-15: qty 0.22

## 2019-05-15 MED ORDER — INSULIN ASPART 100 UNIT/ML ~~LOC~~ SOLN
0.0000 [IU] | Freq: Three times a day (TID) | SUBCUTANEOUS | Status: DC
  Administered 2019-05-15: 1 [IU] via SUBCUTANEOUS
  Administered 2019-05-15: 2 [IU] via SUBCUTANEOUS
  Administered 2019-05-16 (×2): 1 [IU] via SUBCUTANEOUS

## 2019-05-15 MED ORDER — CHLORHEXIDINE GLUCONATE CLOTH 2 % EX PADS
6.0000 | MEDICATED_PAD | Freq: Every day | CUTANEOUS | Status: DC
  Administered 2019-05-15 – 2019-05-16 (×2): 6 via TOPICAL

## 2019-05-15 MED ORDER — SODIUM CHLORIDE 0.9% FLUSH
10.0000 mL | Freq: Two times a day (BID) | INTRAVENOUS | Status: DC
  Administered 2019-05-15: 20 mL
  Administered 2019-05-15 – 2019-05-16 (×2): 10 mL

## 2019-05-15 MED ORDER — ALTEPLASE 2 MG IJ SOLR
2.0000 mg | Freq: Once | INTRAMUSCULAR | Status: AC
  Administered 2019-05-15: 2 mg
  Filled 2019-05-15: qty 2

## 2019-05-15 MED ORDER — SODIUM ZIRCONIUM CYCLOSILICATE 10 G PO PACK
10.0000 g | PACK | Freq: Once | ORAL | Status: AC
  Administered 2019-05-15: 10 g via ORAL
  Filled 2019-05-15: qty 1

## 2019-05-15 NOTE — Progress Notes (Addendum)
The patient has been seen in conjunction with Jessica Rancher, MD. All aspects of care have been considered and discussed. The patient has been personally interviewed, examined, and all clinical data has been reviewed.   Agree with note.   Progress Note  Patient Name: [name] Date of Encounter: 05/15/2019  Primary Cardiologist: Glenetta Hew, MD   Subjective   She is doing well this morning and without chest pain or shortness of breath.  Inpatient Medications    Scheduled Meds: . aspirin EC  81 mg Oral Daily  . clopidogrel  75 mg Oral Daily  . enoxaparin (LOVENOX) injection  40 mg Subcutaneous Q24H  . famotidine  40 mg Oral Daily  . gabapentin  600 mg Oral TID  . insulin aspart  0-9 Units Subcutaneous Q4H  . insulin glargine  20 Units Subcutaneous Daily  . metoprolol tartrate  25 mg Oral BID  . multivitamin with minerals  1 tablet Oral Daily  . pantoprazole  40 mg Oral Daily  . rosuvastatin  40 mg Oral QHS  . sertraline  50 mg Oral Daily  . sodium chloride flush  3 mL Intravenous Once  . sodium chloride flush  3 mL Intravenous Q12H  . sucralfate  1 g Oral BID   Continuous Infusions: . sodium chloride    . sodium chloride Stopped (05/14/19 1600)   PRN Meds: sodium chloride, acetaminophen, albuterol, dextromethorphan-guaiFENesin, fluticasone, hydrALAZINE, morphine injection, nitroGLYCERIN, ondansetron (ZOFRAN) IV, sodium chloride flush, traMADol   Vital Signs    Vitals:   05/14/19 2335 05/15/19 0439 05/15/19 0454 05/15/19 0456  BP: 96/69 106/64    Pulse: 80 77    Resp: 18 11 13 18   Temp: 99.2 F (37.3 C) 99 F (37.2 C)    TempSrc: Oral Oral    SpO2: 98% 98%    Weight:      Height:        Intake/Output Summary (Last 24 hours) at 05/15/2019 0625 Last data filed at 05/14/2019 1600 Gross per 24 hour  Intake 1049.52 ml  Output 2 ml  Net 1047.52 ml   Filed Weights   05/12/19 2249 05/13/19 1612  Weight: 96.6 kg 97.7 kg    Telemetry    Unremarkable-  Personally Reviewed  ECG    Sinus rhythm- Personally Reviewed  Physical Exam   GEN: No acute distress.   Cardiac: RRR, no murmurs, rubs, or gallops.  Respiratory: Clear to auscultation bilaterally. MS: No edema; No deformity. Neuro:  Nonfocal  Psych: Normal affect   Labs    Chemistry Recent Labs  Lab 05/12/19 2259 05/13/19 1516 05/14/19 0539 05/15/19 0232  NA 139 140 141 142  K 4.8 4.8 4.5 5.6*  CL 110 110 112* 113*  CO2 19* 23 20* 21*  GLUCOSE 441* 261* 172* 121*  BUN 19 21 21 17   CREATININE 1.56* 1.62* 1.40* 1.57*  CALCIUM 8.3* 8.2* 8.5* 8.8*  PROT 5.7*  --  5.9*  --   ALBUMIN 2.4*  --  2.4*  --   AST 34  --  28  --   ALT 28  --  26  --   ALKPHOS 100  --  88  --   BILITOT 0.5  --  0.3  --   GFRNONAA 34* 33* 39* 34*  GFRAA 39* 38* 45* 39*  ANIONGAP 10 7 9 8      Hematology Recent Labs  Lab 05/12/19 2259 05/14/19 0539 05/15/19 0232  WBC 1.8* 9.0  9.1 5.8  RBC 3.95 3.89  3.93 4.09  HGB 11.5* 11.2*  11.4* 11.8*  HCT 35.8* 33.8*  33.5* 35.8*  MCV 90.6 86.9  85.2 87.5  MCH 29.1 28.8  29.0 28.9  MCHC 32.1 33.1  34.0 33.0  RDW 17.5* 17.4*  17.2* 17.6*  PLT 260 263  255 275    Cardiac EnzymesNo results for input(s): TROPONINI in the last 168 hours. No results for input(s): TROPIPOC in the last 168 hours.   BNPNo results for input(s): BNP, PROBNP in the last 168 hours.   DDimer No results for input(s): DDIMER in the last 168 hours.   Radiology    Dg Chest 2 View  Result Date: 05/14/2019 CLINICAL DATA:  Fever. EXAM: CHEST - 2 VIEW COMPARISON:  None. FINDINGS: The heart size and mediastinal contours are within normal limits. Right internal jugular Port-A-Cath is noted with tip in expected position of the SVC. Both lungs are clear. The visualized skeletal structures are unremarkable. IMPRESSION: No active cardiopulmonary disease. Electronically Signed   By: Marijo Conception M.D.   On: 05/14/2019 12:53    Cardiac Studies   08/27/2018-left heart  cath   The left ventricular systolic function is normal.  LV end diastolic pressure is normal.  The left ventricular ejection fraction is greater than 65% by visual estimate.  There is trivial (1+) mitral regurgitation.  There is no aortic valve stenosis.  Prox RCA to Mid RCA lesion is 100% stenosed.  Post intervention, there is a 0% residual stenosis.  A drug-eluting stent was successfully placed using a [place] [id number].  Prox RCA lesion is 20% stenosed.  Prox Cx lesion is 90% stenosed.  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  Normal LVEDP.   08/28/2018-echocardiogram 1. The left ventricle has low normal systolic function of 97-98%. The cavity size is normal. There is severely increased left ventricular wall thickness of the septal wall. Normal left ventricular filling pressures. The left ventricular diastology could not be evaluated due to indeterminent diastolic function. There is severe akinesis of the basiliar-mid inferior left ventricular segment. 2. There is mild dilatation of the ascending aorta. 3. Ao Asc diam:3.75 cm. 4. The tricuspid valve is normal in structure. Regurgitation is mild by color flow Doppler. 5. The mitral valve is normal in structure Regurgitation is trivial by color flow Doppler. 6. Compared to prior echo, there is now mid and basal inferior akinesis. The endocardial segments are not well visualized and Definity contrast would be helpful to delineate if this is a new wall motion abnormality.   08/29/2018-left heart cath  Previously placed Prox RCA to Mid RCA drug eluting stent is widely patent.  Balloon angioplasty was performed.  Prox RCA lesion is 20% stenosed.  Prox Cx to Mid Cx lesion is 90% stenosed.  Post intervention, there is a 0%  residual stenosis.  A drug-eluting stent was successfully placed using a STENT RESOLUTE ONYX [id number].  Successful angioplasty and drug-eluting stent placement to the mid left circumflex via the right radial artery.  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  As discussed earlier, will treat conservatively -Continue aspirin, Plavix, Crestor -As needed nitroglycerin -Continue cardiac monitoring  #Fever of unclear etiology She has remained afebrile overnight.  CBC without leukocytosis.  Pending blood culture, urinalysis.  Her procalcitonin initially elevated at 7 however trending down to 4.9. Blood culture NGTD -Will defer antibiotics to primary team -Follow-up blood culture, urinalysis, GI panel PCR and C. diff  #Hyperkalemia BMP this am shows K+ of 5.6. EKG today without T-wave abnormalities.  She is already on Insulin  -Repeat K+  #GERD -Continue Pepcid, Protonix  #Hyperlipidemia LDL of18, triglycerides of 54 -Continue Crestor  #History of TIA -Continue aspirin, Plavix, Crestor  #Type 2 diabetes mellitus A1c of 10%. CBG has ranged in the 90s-260s -Continue insulin  #Hypertension BP labile. Advised to hold Imdur  -Continue metoprolol  #CKD Stage III Serum creatinine 1.5<<1.4<<1.6 -Monitor BNP  Cardiology will sign off. Thanks for allowing [place] to be part of her care.      For questions or updates, please contact [place] Please consult [email or web address] for contact info under  Cardiology/STEMI.      Signed, Jean Rosenthal, MD  05/15/2019, 6:25 AM

## 2019-05-15 NOTE — Progress Notes (Signed)
Patient refused the blood work for K+ level, explained patient why she needs it, but patient said that lab tech tried twice early in the morning and failed. Explained patient that will access the port a cath. She refused it also, but using the numb cream then it's fine. Ordered IV team consult for accessing port a cath. HS [employer]

## 2019-05-15 NOTE — Plan of Care (Signed)
  Problem: Education: Goal: Knowledge of General Education information will improve Description: Including pain rating scale, medication(s)/side effects and non-pharmacologic comfort measures Outcome: Progressing   Problem: Health Behavior/Discharge Planning: Goal: Ability to manage health-related needs will improve Outcome: Progressing   Problem: Clinical Measurements: Goal: Ability to maintain clinical measurements within normal limits will improve Outcome: Progressing Goal: Will remain free from infection Outcome: Progressing Goal: Diagnostic test results will improve Outcome: Progressing Goal: Respiratory complications will improve Outcome: Progressing Goal: Cardiovascular complication will be avoided Outcome: Progressing   Problem: Activity: Goal: Risk for activity intolerance will decrease Outcome: Progressing   Problem: Nutrition: Goal: Adequate nutrition will be maintained Outcome: Progressing   Problem: Coping: Goal: Level of anxiety will decrease Outcome: Progressing   Problem: Elimination: Goal: Will not experience complications related to bowel motility Outcome: Progressing Goal: Will not experience complications related to urinary retention Outcome: Progressing   Problem: Pain Managment: Goal: General experience of comfort will improve Outcome: Progressing   Problem: Safety: Goal: Ability to remain free from injury will improve Outcome: Progressing   Problem: Skin Integrity: Goal: Risk for impaired skin integrity will decrease Outcome: Progressing   Problem: Clinical Measurements: Goal: Postoperative complications will be avoided or minimized Outcome: Progressing   Problem: Skin Integrity: Goal: Demonstration of wound healing without infection will improve Outcome: Progressing   Problem: Education: Goal: Understanding of CV disease, CV risk reduction, and recovery process will improve Outcome: Progressing Goal: Individualized Educational  Video(s) Outcome: Progressing   Problem: Activity: Goal: Ability to return to baseline activity level will improve Outcome: Progressing   Problem: Cardiovascular: Goal: Ability to achieve and maintain adequate cardiovascular perfusion will improve Outcome: Progressing Goal: Vascular access site(s) Level 0-1 will be maintained Outcome: Progressing   Problem: Health Behavior/Discharge Planning: Goal: Ability to safely manage health-related needs after discharge will improve Outcome: Progressing

## 2019-05-15 NOTE — Progress Notes (Signed)
Physical Therapy Treatment Patient Details Name: [name] MRN: [id number] DOB: 07-Sep-1950 Today's Date: 05/15/2019    History of Present Illness Pt is a 68 y.o. female admitted 05/12/19 with chest pain. CXR negative. Worked up for NSTEMI. Cardiac cath deferred due to fever 10/20. PMH includes HTN, HLD, DM, TIA, CAD, CKD III, asthma, depression, stomach cancer.    PT Comments    Patient seen for mobility progression. Pt tolerated increased mobility well. VSS. Current plan remains appropriate.    Follow Up Recommendations  No PT follow up;Supervision - Intermittent     Equipment Recommendations  None recommended by PT    Recommendations for Other Services       Precautions / Restrictions Precautions Precautions: Fall Restrictions Weight Bearing Restrictions: No    Mobility  Bed Mobility Overal bed mobility: Modified Independent             General bed mobility comments: HOB slightly elevated  Transfers Overall transfer level: Modified independent Equipment used: Rolling walker (2 wheeled)                Ambulation/Gait Ambulation/Gait assistance: Supervision Gait Distance (Feet): 400 Feet Assistive device: Rolling walker (2 wheeled) Gait Pattern/deviations: Step-through pattern;Decreased stride length     General Gait Details: decreased cadence; steady gait; VSS   Stairs             Wheelchair Mobility    Modified Rankin (Stroke Patients Only)       Balance Overall balance assessment: Needs assistance   Sitting balance-Leahy Scale: Good       Standing balance-Leahy Scale: Fair Standing balance comment: Can static stand without UE support                            Cognition Arousal/Alertness: Awake/alert Behavior During Therapy: WFL for tasks assessed/performed Overall Cognitive Status: Within Functional Limits for tasks assessed                                        Exercises      General  Comments        Pertinent Vitals/Pain Pain Assessment: No/denies pain    Home Living                      Prior Function            PT Goals (current goals can now be found in the care plan section) Acute Rehab PT Goals Patient Stated Goal: Return home Progress towards PT goals: Progressing toward goals    Frequency    Min 3X/week      PT Plan Current plan remains appropriate    Co-evaluation              AM-PAC PT "6 Clicks" Mobility   Outcome Measure  Help needed turning from your back to your side while in a flat bed without using bedrails?: None Help needed moving from lying on your back to sitting on the side of a flat bed without using bedrails?: None Help needed moving to and from a bed to a chair (including a wheelchair)?: None Help needed standing up from a chair using your arms (e.g., wheelchair or bedside chair)?: None Help needed to walk in hospital room?: A Little Help needed climbing 3-5 steps with a railing? : A Little 6 Click Score:  22    End of Session Equipment Utilized During Treatment: Gait belt Activity Tolerance: Patient tolerated treatment well Patient left: with call bell/phone within reach;in bed;Other (comment)(bed in chair position) Nurse Communication: Mobility status PT Visit Diagnosis: Other abnormalities of gait and mobility (R26.89)     Time: 1237-9909 PT Time Calculation (min) (ACUTE ONLY): 35 min  Charges:  $Gait Training: 23-37 mins                     Earney Navy, PTA Acute Rehabilitation Services Pager: [phone] Office: [phone]     Darliss Cheney 05/15/2019, 12:56 PM

## 2019-05-15 NOTE — Progress Notes (Signed)
[facility] will sign off.   Medication Recommendations:  No change Other recommendations (labs, testing, etc):  None. Continue cardiac regimen as prior to admission Follow up as an outpatient:  As scheduled.

## 2019-05-15 NOTE — Progress Notes (Signed)
PROGRESS NOTE    ABIHA Jefferson  CHE:[id number] DOB: 08/08/1950 DOA: 05/12/2019 PCP: Ann Held, DO     Brief Narrative:  [name] is a 68 y.o. female with medical history significant of HTN, HLD, DM, TIA, CAD, s/p of DES, ashtma, depression, sickle cell trait, stomach cancer (s/p surgery, radiation and chemotherapy, not on medication currently), who presents with chest pain. Patient states that her chest pain started at about 8 PM, which is located in the substernal area, initially 10 out of 10 in severity, currently 4 out of 10 in severity, pressure like, radiating to the neck, not pleuritic, not aggravated by deep breath.  Initially patient had shortness of breath, which has resolved.  She has  dry cough, but no fever chills.  Denies nausea, vomiting, diarrhea, abdominal pain, symptoms of UTI or unilateral weakness.  Patient was admitted for further chest pain work-up, cardiology consulted.  New events last 24 hours / Subjective: No further chest pain.  Fever free over the last 24 hours.  She does have some complaints of diarrhea, had "many" beige-colored loose stools yesterday, 2 episodes thus far this morning.  Admits to some stomach "gurgling" without abdominal pain.  Denies any nausea or vomiting.  She also admits to some left forearm pain.  She does have some point tenderness over the extensor muscles.  Assessment & Plan:   Principal Problem:   Chest pain Active Problems:   Hyperlipidemia   Asthma   GERD (gastroesophageal reflux disease)   History of CVA (cerebrovascular accident)   Type 2 diabetes with nephropathy (HCC)   CKD (chronic kidney disease) stage 3, GFR 30-59 ml/min (HCC)   Neutropenia (HCC)   CAD (coronary artery disease)   SIRS (systemic inflammatory response syndrome) (HCC)   NSTEMI (non-ST elevated myocardial infarction) ([place])   Hyperglycemia   Chest pain/NSTEMI -Hx CAD s/p PCI with  of DES to RCA on 08/29/18.Troponin 6>>9>>34>>374 >>395.No  significant EKG change. Chest xray unremarkable.  -Cardiology has recommended medical management, signed off 10/21.  Continue aspirin, Plavix, Crestor, Lopressor  Fever/SIRS -Max temp last night 102.8 on 10/21. Procalcitonin 7.5. Etiology unclear -Urinalysis and culture pending -Blood cultures pending -Due to diarrhea, obtain stool culture as well as C. difficile  Hyperlipidemia -Continue Crestor  Asthma -Without exacerbation. Atrovent inhaler, albuterol inhaler as needed  GERD (gastroesophageal reflux disease) -Stable. Continue home meds.Protonix and Pepcid  History of CVA (cerebrovascular accident)and TIA:  -Continue ASA, plavix and Crestor  Type 2 diabetes with nephropathy ([place]) -Poorly controlled. Home meds includetakingTresiba, metformin, Ozempic. -Continue Lantus, sliding scale insulin  CKD (chronic kidney disease) stage 3, GFR 30-59 ml/min -Baseline creatinine 1.0-1.3  Hyperkalemia -Lokelma ordered, repeat lab ordered    DVT prophylaxis: Lovenox Code Status: Full code Family Communication: None Disposition Plan: Further work-up for fever   Consultants:   Cardiology  Procedures:   None  Antimicrobials:  Anti-infectives (From admission, onward)   None        Objective: Vitals:   05/15/19 0456 05/15/19 0746 05/15/19 1041 05/15/19 1112  BP:  103/62  101/65  Pulse:   80 76  Resp: 18 20    Temp:  99.1 F (37.3 C) 98.7 F (37.1 C)   TempSrc:  Oral Oral   SpO2:    98%  Weight:      Height:        Intake/Output Summary (Last 24 hours) at 05/15/2019 1121 Last data filed at 05/14/2019 1600 Gross per 24 hour  Intake 1046.52  ml  Output -  Net 1046.52 ml   Filed Weights   05/12/19 2249 05/13/19 1612  Weight: 96.6 kg 97.7 kg    Examination:  General exam: Appears calm and comfortable  Respiratory system: Clear to auscultation. Respiratory effort normal. No respiratory distress. No conversational dyspnea.  Cardiovascular system:  S1 & S2 heard, RRR. No murmurs. No pedal edema. Gastrointestinal system: Abdomen is nondistended, soft and nontender. Normal bowel sounds heard. Central nervous system: Alert and oriented. No focal neurological deficits. Speech clear.  Extremities: Symmetric in appearance, point tenderness along the extensor surface of her left forearm Skin: No rashes, lesions or ulcers on exposed skin  Psychiatry: Judgement and insight appear normal. Mood & affect appropriate.   Data Reviewed: I have personally reviewed following labs and imaging studies  CBC: Recent Labs  Lab 05/12/19 2259 05/14/19 0539 05/15/19 0232  WBC 1.8* 9.0  9.1 5.8  NEUTROABS 1.0* 7.1  --   HGB 11.5* 11.2*  11.4* 11.8*  HCT 35.8* 33.8*  33.5* 35.8*  MCV 90.6 86.9  85.2 87.5  PLT 260 263  255 657   Basic Metabolic Panel: Recent Labs  Lab 05/12/19 2259 05/13/19 1516 05/14/19 0539 05/15/19 0232  NA 139 140 141 142  K 4.8 4.8 4.5 5.6*  CL 110 110 112* 113*  CO2 19* 23 20* 21*  GLUCOSE 441* 261* 172* 121*  BUN 19 21 21 17   CREATININE 1.56* 1.62* 1.40* 1.57*  CALCIUM 8.3* 8.2* 8.5* 8.8*   GFR: Estimated Creatinine Clearance: 41 mL/min (A) (by C-G formula based on SCr of 1.57 mg/dL (H)). Liver Function Tests: Recent Labs  Lab 05/12/19 2259 05/14/19 0539  AST 34 28  ALT 28 26  ALKPHOS 100 88  BILITOT 0.5 0.3  PROT 5.7* 5.9*  ALBUMIN 2.4* 2.4*   Recent Labs  Lab 05/12/19 2259  LIPASE 17   No results for input(s): AMMONIA in the last 168 hours. Coagulation Profile: No results for input(s): INR, PROTIME in the last 168 hours. Cardiac Enzymes: No results for input(s): CKTOTAL, CKMB, CKMBINDEX, TROPONINI in the last 168 hours. BNP (last 3 results) No results for input(s): PROBNP in the last 8760 hours. HbA1C: Recent Labs    05/13/19 0807  HGBA1C 10.0*   CBG: Recent Labs  Lab 05/14/19 2039 05/14/19 2334 05/15/19 0439 05/15/19 0805 05/15/19 1044  GLUCAP 266* 153* 98 224* 159*   Lipid  Profile: Recent Labs    05/13/19 0807  CHOL 75  HDL 46  LDLCALC 18  TRIG 54  CHOLHDL 1.6   Thyroid Function Tests: No results for input(s): TSH, T4TOTAL, FREET4, T3FREE, THYROIDAB in the last 72 hours. Anemia Panel: No results for input(s): VITAMINB12, FOLATE, FERRITIN, TIBC, IRON, RETICCTPCT in the last 72 hours. Sepsis Labs: Recent Labs  Lab 05/14/19 0539 05/15/19 0232  PROCALCITON 7.57 4.94    Recent Results (from the past 240 hour(s))  SARS CORONAVIRUS 2 (TAT 6-24 HRS) Nasopharyngeal Nasopharyngeal Swab     Status: None   Collection Time: 05/13/19 12:10 AM   Specimen: Nasopharyngeal Swab  Result Value Ref Range Status   SARS Coronavirus 2 NEGATIVE NEGATIVE Final    Comment: (NOTE) SARS-CoV-2 target nucleic acids are NOT DETECTED. The SARS-CoV-2 RNA is generally detectable in upper and lower respiratory specimens during the acute phase of infection. Negative results do not preclude SARS-CoV-2 infection, do not rule out co-infections with other pathogens, and should not be used as the sole basis for treatment or other patient management decisions. Negative  results must be combined with clinical observations, patient history, and epidemiological information. The expected result is Negative. Fact Sheet for Patients: SugarRoll.be Fact Sheet for Healthcare Providers: https://www.woods-mathews.com/ This test is not yet approved or cleared by the [place] FDA and  has been authorized for detection and/or diagnosis of SARS-CoV-2 by FDA under an Emergency Use Authorization (EUA). This EUA will remain  in effect (meaning this test can be used) for the duration of the COVID-19 declaration under Section 56 4(b)(1) of the Act, 21 U.S.C. section 360bbb-3(b)(1), unless the authorization is terminated or revoked sooner. Performed at [facility], [place] [address], [place], [place] [address]   MRSA PCR Screening     Status: None    Collection Time: 05/13/19  4:12 PM   Specimen: Nasal Mucosa; Nasopharyngeal  Result Value Ref Range Status   MRSA by PCR NEGATIVE NEGATIVE Final    Comment:        The GeneXpert MRSA Assay (FDA approved for NASAL specimens only), is one component of a comprehensive MRSA colonization surveillance program. It is not intended to diagnose MRSA infection nor to guide or monitor treatment for MRSA infections. Performed at [facility], [place] [address] [place], [place] [address]   Culture, blood (routine x 2)     Status: None (Preliminary result)   Collection Time: 05/14/19  2:37 PM   Specimen: BLOOD RIGHT HAND  Result Value Ref Range Status   Specimen Description BLOOD RIGHT HAND  Final   Special Requests   Final    BOTTLES DRAWN AEROBIC ONLY Blood Culture results may not be optimal due to an inadequate volume of blood received in culture bottles   Culture   Final    NO GROWTH < 24 HOURS Performed at [facility], [place] [address], [place], [place] [address]    Report Status PENDING  Incomplete  Culture, blood (routine x 2)     Status: None (Preliminary result)   Collection Time: 05/14/19  2:46 PM   Specimen: BLOOD RIGHT HAND  Result Value Ref Range Status   Specimen Description BLOOD RIGHT HAND  Final   Special Requests   Final    BOTTLES DRAWN AEROBIC ONLY Blood Culture results may not be optimal due to an inadequate volume of blood received in culture bottles   Culture   Final    NO GROWTH < 24 HOURS Performed at [facility], [place] [address], [place], [place] [address]    Report Status PENDING  Incomplete      Radiology Studies: Dg Chest 2 View  Result Date: 05/14/2019 CLINICAL DATA:  Fever. EXAM: CHEST - 2 VIEW COMPARISON:  None. FINDINGS: The heart size and mediastinal contours are within normal limits. Right internal jugular Port-A-Cath is noted with tip in expected position of the SVC. Both lungs are clear. The visualized skeletal structures are  unremarkable. IMPRESSION: No active cardiopulmonary disease. Electronically Signed   By: Marijo Conception M.D.   On: 05/14/2019 12:53      Scheduled Meds: . aspirin EC  81 mg Oral Daily  . clopidogrel  75 mg Oral Daily  . enoxaparin (LOVENOX) injection  40 mg Subcutaneous Q24H  . famotidine  40 mg Oral Daily  . gabapentin  600 mg Oral TID  . insulin aspart  0-9 Units Subcutaneous Q4H  . insulin glargine  20 Units Subcutaneous Daily  . metoprolol tartrate  25 mg Oral BID  . multivitamin with minerals  1  tablet Oral Daily  . pantoprazole  40 mg Oral Daily  . rosuvastatin  40 mg Oral QHS  . sertraline  50 mg Oral Daily  . sodium chloride flush  3 mL Intravenous Once  . sodium chloride flush  3 mL Intravenous Q12H  . sucralfate  1 g Oral BID   Continuous Infusions: . sodium chloride    . sodium chloride Stopped (05/14/19 1600)     LOS: 1 day      Time spent: 40 minutes   Dessa Phi, DO [facility] 05/15/2019, 11:21 AM   Available via Epic secure chat 7am-7pm After these hours, please refer to coverage provider listed on [email or web address]

## 2019-05-16 LAB — GLUCOSE, CAPILLARY
Glucose-Capillary: 132 mg/dL — ABNORMAL HIGH (ref 70–99)
Glucose-Capillary: 121 mg/dL — ABNORMAL HIGH (ref 70–99)

## 2019-05-16 LAB — CBC
HCT: 33.6 % — ABNORMAL LOW (ref 36.0–46.0)
Hemoglobin: 10.9 g/dL — ABNORMAL LOW (ref 12.0–15.0)
MCH: 28.7 pg (ref 26.0–34.0)
MCHC: 32.4 g/dL (ref 30.0–36.0)
MCV: 88.4 fL (ref 80.0–100.0)
Platelets: 265 10*3/uL (ref 150–400)
RBC: 3.8 MIL/uL — ABNORMAL LOW (ref 3.87–5.11)
RDW: 17.3 % — ABNORMAL HIGH (ref 11.5–15.5)
WBC: 7 10*3/uL (ref 4.0–10.5)
nRBC: 0 % (ref 0.0–0.2)

## 2019-05-16 LAB — BASIC METABOLIC PANEL
Anion gap: 7 (ref 5–15)
BUN: 17 mg/dL (ref 8–23)
CO2: 24 mmol/L (ref 22–32)
Calcium: 8.3 mg/dL — ABNORMAL LOW (ref 8.9–10.3)
Chloride: 109 mmol/L (ref 98–111)
Creatinine, Ser: 1.24 mg/dL — ABNORMAL HIGH (ref 0.44–1.00)
GFR calc Af Amer: 52 mL/min — ABNORMAL LOW (ref >60)
GFR calc non Af Amer: 45 mL/min — ABNORMAL LOW (ref >60)
Glucose, Bld: 167 mg/dL — ABNORMAL HIGH (ref 70–99)
Potassium: 4.6 mmol/L (ref 3.5–5.1)
Sodium: 140 mmol/L (ref 135–145)

## 2019-05-16 LAB — PROCALCITONIN: Procalcitonin: 2.2 ng/mL

## 2019-05-16 MED ORDER — HEPARIN SOD (PORK) LOCK FLUSH 100 UNIT/ML IV SOLN
500.0000 [IU] | INTRAVENOUS | Status: AC | PRN
  Administered 2019-05-16: 500 [IU]
  Filled 2019-05-16: qty 5

## 2019-05-16 NOTE — Plan of Care (Signed)
  Problem: Education: Goal: Knowledge of General Education information will improve Description: Including pain rating scale, medication(s)/side effects and non-pharmacologic comfort measures Outcome: Progressing   Problem: Health Behavior/Discharge Planning: Goal: Ability to manage health-related needs will improve Outcome: Progressing   Problem: Clinical Measurements: Goal: Ability to maintain clinical measurements within normal limits will improve Outcome: Progressing Goal: Will remain free from infection Outcome: Progressing Goal: Diagnostic test results will improve Outcome: Progressing Goal: Respiratory complications will improve Outcome: Progressing Goal: Cardiovascular complication will be avoided Outcome: Progressing   Problem: Activity: Goal: Risk for activity intolerance will decrease Outcome: Progressing   Problem: Nutrition: Goal: Adequate nutrition will be maintained Outcome: Progressing   Problem: Coping: Goal: Level of anxiety will decrease Outcome: Progressing   Problem: Elimination: Goal: Will not experience complications related to bowel motility Outcome: Progressing Goal: Will not experience complications related to urinary retention Outcome: Progressing   Problem: Pain Managment: Goal: General experience of comfort will improve Outcome: Progressing   Problem: Safety: Goal: Ability to remain free from injury will improve Outcome: Progressing   Problem: Skin Integrity: Goal: Risk for impaired skin integrity will decrease Outcome: Progressing   Problem: Clinical Measurements: Goal: Postoperative complications will be avoided or minimized Outcome: Progressing   Problem: Skin Integrity: Goal: Demonstration of wound healing without infection will improve Outcome: Progressing   Problem: Education: Goal: Understanding of CV disease, CV risk reduction, and recovery process will improve Outcome: Progressing Goal: Individualized Educational  Video(s) Outcome: Progressing   Problem: Activity: Goal: Ability to return to baseline activity level will improve Outcome: Progressing   Problem: Cardiovascular: Goal: Ability to achieve and maintain adequate cardiovascular perfusion will improve Outcome: Progressing Goal: Vascular access site(s) Level 0-1 will be maintained Outcome: Progressing   Problem: Health Behavior/Discharge Planning: Goal: Ability to safely manage health-related needs after discharge will improve Outcome: Progressing

## 2019-05-16 NOTE — TOC Initial Note (Signed)
Transition of Care [facility]) - Initial/Assessment Note    Patient Details  Name: [name] MRN: [id number] Date of Birth: 02/26/1951  Transition of Care [facility]) CM/SW Contact:    Zenon Mayo, RN Phone Number: 05/16/2019, 12:42 PM  Clinical Narrative:                 From home alone, for dc today, she has no issues with getting medications. She has transportation, she states she would like some pads.  NCM informed her that she could get those from a medical supply store.    Expected Discharge Plan: Group Home Barriers to Discharge: No Barriers Identified   Patient Goals and CMS Choice Patient states their goals for this hospitalization and ongoing recovery are:: not to come back for this again   Choice offered to / list presented to : NA  Expected Discharge Plan and Services Expected Discharge Plan: Group Home In-house Referral: NA Discharge Planning Services: CM Consult Post Acute Care Choice: NA Living arrangements for the past 2 months: Single Family Home Expected Discharge Date: 05/16/19                         [facility] Arranged: NA          Prior Living Arrangements/Services Living arrangements for the past 2 months: Single Family Home Lives with:: Self Patient language and need for interpreter reviewed:: Yes Do you feel safe going back to the place where you live?: Yes      Need for Family Participation in Patient Care: Yes (Comment) Care giver support system in place?: Yes (comment)   Criminal Activity/Legal Involvement Pertinent to Current Situation/Hospitalization: No - Comment as needed  Activities of Daily Living Home Assistive Devices/Equipment: None ADL Screening (condition at time of admission) Patient's cognitive ability adequate to safely complete daily activities?: Yes Is the patient deaf or have difficulty hearing?: No Does the patient have difficulty seeing, even when wearing glasses/contacts?: No Does the patient have difficulty  concentrating, remembering, or making decisions?: No Patient able to express need for assistance with ADLs?: Yes Does the patient have difficulty dressing or bathing?: No Independently performs ADLs?: Yes (appropriate for developmental age) Does the patient have difficulty walking or climbing stairs?: No Weakness of Legs: None Weakness of Arms/Hands: None  Permission Sought/Granted                  Emotional Assessment   Attitude/Demeanor/Rapport: Gracious Affect (typically observed): Appropriate Orientation: : Oriented to Self, Oriented to Place, Oriented to  Time, Oriented to Situation Alcohol / Substance Use: Not Applicable Psych Involvement: No (comment)  Admission diagnosis:  Precordial chest pain [R07.2] Hyperglycemia [R73.9] Patient Active Problem List   Diagnosis Date Noted  . SIRS (systemic inflammatory response syndrome) ([place]) 05/14/2019  . NSTEMI (non-ST elevated myocardial infarction) ([place]) 05/14/2019  . Hyperglycemia   . Neutropenia ([place])   . CAD (coronary artery disease)   . Obesity with serious comorbidity 04/16/2019  . Gastroenteritis 03/27/2019  . Falls frequently 03/27/2019  . Acute gastroenteritis 03/22/2019  . Hematoma of scalp 03/14/2019  . Vitamin D deficiency 11/09/2018  . Muscle spasm 10/19/2018  . Chest pain 09/07/2018  . Palpitations 09/07/2018  . Other fatigue 09/07/2018  . Acute ST elevation myocardial infarction (STEMI) of inferior wall ([place]) 08/27/2018    Class: Hospitalized for  . Coronary artery disease involving native coronary artery of native heart with unstable angina pectoris ([place]) 08/27/2018    Class: Diagnosis  of  . Acute vaginitis 08/20/2018  . Hyperlipidemia associated with type 2 diabetes mellitus ([place]) 04/09/2018  . Bronchopneumonia 11/02/2017  . Sepsis ([place]) 11/02/2017  . Leucocytosis 11/02/2017  . Moderate persistent asthma with acute exacerbation 05/26/2017  . Influenza A 05/26/2017  . Acute pain of right shoulder  03/07/2017  . Acute bilateral low back pain without sciatica 03/07/2017  . Chronic pain 03/07/2017  . Fatty liver 03/24/2016  . Weakness 03/23/2016  . Unexplained weight loss 03/23/2016  . Melena 03/23/2016  . Hypotension 02/03/2016  . Syncope 02/03/2016  . Hypomagnesemia 02/03/2016  . AKI (acute kidney injury) ([place]) 02/03/2016  . Gastrointestinal dysmotility 01/20/2016  . CKD (chronic kidney disease) stage 3, GFR 30-59 ml/min (HCC) 01/20/2016  . Abscess of right thigh 01/20/2016  . Chronic pain syndrome 01/15/2016  . Diarrhea 01/15/2016  . Generalized abdominal pain 01/14/2016  . Type 2 diabetes with nephropathy ([place]) 12/30/2015  . TIA (transient ischemic attack) 07/2015  . Morbid obesity due to excess calories ([place]) 05/05/2015  . Diabetic polyneuropathy associated with type 2 diabetes mellitus ([place]) 01/02/2015  . Acute bacterial sinusitis 01/02/2015  . Onychomycosis 11/04/2014  . Pain in lower limb 11/04/2014  . Multinodular goiter (nontoxic) 10/16/2014  . History of CVA (cerebrovascular accident) 08/11/2014  . Iron deficiency anemia 05/20/2014  . Major depressive disorder, single episode, moderate ([place]) 05/20/2014  . Dizziness of unknown cause 04/28/2014  . MDD (major depressive disorder), recurrent episode, moderate ([place]) 01/28/2014  . GERD (gastroesophageal reflux disease) 01/02/2014  . Nausea 10/15/2012  . Stomach cancer ([place]) 12/10/2008  . CHRONIC RESPIRATORY FAILURE 01/03/2008  . Hyperlipidemia 06/27/2007  . OBESITY, MORBID 06/27/2007  . SLEEP APNEA, OBSTRUCTIVE 06/27/2007  . Generalized idiopathic epilepsy and epileptic syndromes, not intractable, with status epilepticus ([place]) 06/27/2007  . Asthma 06/27/2007  . OSTEOARTHRITIS  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 06/27/2007   PCP:  Ann Held, DO Pharmacy:   [id number] [place] [place]Phone: [phone]:  [phone]    Social Determinants of Health (SDOH) Interventions    Readmission Risk Interventions Readmission Risk Prevention Plan 05/16/2019  Transportation Screening Complete  Medication Review ([place] Complete  [place] or [place]  SW Recovery Care/Counseling Consult Complete  Palliative Care Screening Not AHooksNot Applicable  Some recent data might be hidden

## 2019-05-16 NOTE — TOC Transition Note (Signed)
Transition of Care [facility]) - CM/SW Discharge Note   Patient Details  Name: [name] MRN: [id number] Date of Birth: 1950-12-30  Transition of Care [facility]) CM/SW Contact:  Zenon Mayo, RN Phone Number: 05/16/2019, 12:48 PM   Clinical Narrative:    DC home    Final next level of care: Home/Self Care Barriers to Discharge: No Barriers Identified   Patient Goals and CMS Choice Patient states their goals for this hospitalization and ongoing recovery are:: not to come back for this again   Choice offered to / list presented to : NA  Discharge Placement                       Discharge Plan and Services In-house Referral: NA Discharge Planning Services: CM Consult Post Acute Care Choice: NA                    HH Arranged: NA          Social Determinants of Health (SDOH) Interventions     Readmission Risk Interventions Readmission Risk Prevention Plan 05/16/2019  Transportation Screening Complete  Medication Review ([place]) Complete  HRI or [place] Complete  SW Recovery Care/Counseling Consult Complete  [place] Not Applicable  Some recent data might be hidden

## 2019-05-16 NOTE — Discharge Summary (Signed)
Physician Discharge Summary  [name] XYI:[id number] DOB: Mar 23, 1951 DOA: 05/12/2019  PCP: Ann Held, DO  Admit date: 05/12/2019 Discharge date: 05/16/2019  Admitted From: home Disposition:  home  Recommendations for Outpatient Follow-up:  1. Follow up with PCP in 1 week 2. Follow up with cardiology 3. Follow-up results of GI PCR panel 4. Follow-up results of blood culture result, on day of discharge it has been negative at 2 days  Discharge Condition: Stable CODE STATUS: Full code Diet recommendation: Heart healthy  Brief/Interim Summary: [name] is a 68 y.o.femalewith medical history significant ofHTN, HLD, DM, TIA, CAD, s/p of DES, ashtma, depression, sickle cell trait,stomach cancer (s/p surgery, radiation and chemotherapy, not on medication currently), who presents with chest pain. Patient states that her chest pain started at about 8 PM, which is located in the substernal area, initially 10 out of 10 in severity, currently 4 out of 10 in severity, pressure like, radiating to the neck, not pleuritic, not aggravated by deep breath. Initially patient had shortness of breath, which has resolved. She has dry cough, but no fever chills. Denies nausea, vomiting, diarrhea, abdominal pain, symptoms of UTI or unilateral weakness.    Patient was admitted for further chest pain work-up, cardiology consulted.  Cardiology has recommended medical management.  Patient did have a fever during hospitalization, work-up is largely remained unremarkable.  She did have complaints of diarrhea, tested negative for C. difficile.  On day of discharge, she states that she feels better, diarrhea has slowed down.  Tolerating diet.  Remained chest pain-free and fever free.  Discharge Diagnoses:  Principal Problem:   Chest pain Active Problems:   Hyperlipidemia   Asthma   GERD (gastroesophageal reflux disease)   History of CVA (cerebrovascular accident)   Type 2 diabetes with  nephropathy (HCC)   CKD (chronic kidney disease) stage 3, GFR 30-59 ml/min (HCC)   Neutropenia (HCC)   CAD (coronary artery disease)   SIRS (systemic inflammatory response syndrome) (HCC)   NSTEMI (non-ST elevated myocardial infarction) ([place])   Hyperglycemia    Chest pain/NSTEMI -Hx CADs/pPCI withof DES to [facility] 08/29/18.Troponin 6>>9>>34>>374 >>395.No significant EKG change.Chest xray unremarkable. -Cardiology has recommended medical management, signed off 10/21.  Continue aspirin, Plavix, Crestor, Lopressor  Fever/SIRS -Max temp last night 102.8 on 10/21. Procalcitonin 7.5. Etiology unclear -Urinalysis negative -Blood cultures negative to date -C. difficile negative -GI PCR panel pending -Patient has now remained fever free and diarrhea improved  Hyperlipidemia -Continue Crestor  Asthma -Without exacerbation. Atrovent inhaler, albuterol inhaler as needed  GERD (gastroesophageal reflux disease) -Stable. Continue home meds.Protonix and Pepcid  History of CVA (cerebrovascular accident)and TIA:  -Continue ASA, plavix and Crestor  Type 2 diabetes with nephropathy ([place]) -Poorly controlled. Home meds includetakingTresiba, metformin, Ozempic. -Continue Lantus, sliding scale insulin  CKD (chronic kidney disease) stage 3, GFR 30-59 ml/min -Baseline creatinine 1.0-1.3 -Stable  Hyperkalemia -Resolved with [facility]   Discharge Instructions  Discharge Instructions    Call MD for:  difficulty breathing, headache or visual disturbances   Complete by: As directed    Call MD for:  extreme fatigue   Complete by: As directed    Call MD for:  persistant dizziness or light-headedness   Complete by: As directed    Call MD for:  persistant nausea and vomiting   Complete by: As directed    Call MD for:  severe uncontrolled pain   Complete by: As directed    Call MD for:  temperature >100.4  Complete by: As directed    Diet - low sodium heart healthy    Complete by: As directed    Discharge instructions   Complete by: As directed    You were cared for by a hospitalist during your hospital stay. If you have any questions about your discharge medications or the care you received while you were in the hospital after you are discharged, you can call the unit and ask to speak with the hospitalist on call if the hospitalist that took care of you is not available. Once you are discharged, your primary care physician will handle any further medical issues. Please note that NO REFILLS for any discharge medications will be authorized once you are discharged, as it is imperative that you return to your primary care physician (or establish a relationship with a primary care physician if you do not have one) for your aftercare needs so that they can reassess your need for medications and monitor your lab values.   Increase activity slowly   Complete by: As directed      Allergies as of 05/16/2019      Reactions   Adhesive [tape] Other (See Comments)   Burn Skin   Codeine Other (See Comments)   paranoid   Zocor [simvastatin - High Dose] Other (See Comments)   Muscle spasms   Penicillins Rash   Has patient had a PCN reaction causing immediate rash, facial/tongue/throat swelling, SOB or lightheadedness with hypotension: Yes Has patient had a PCN reaction causing severe rash involving mucus membranes or skin necrosis: No Has patient had a PCN reaction that required hospitalization does not remember.  Has patient had a PCN reaction occurring within the last 10 years: No If all of the above answers are "NO", then may proceed with Cephalosporin use.      Medication List    STOP taking these medications   darifenacin 7.5 MG 24 hr tablet Commonly known as: Enablex   glimepiride 4 MG tablet Commonly known as: AMARYL   HYDROcodone-acetaminophen 5-325 MG tablet Commonly known as: NORCO/VICODIN   ibuprofen 600 MG tablet Commonly known as: ADVIL    methocarbamol 500 MG tablet Commonly known as: Robaxin   ondansetron 4 MG tablet Commonly known as: ZOFRAN   simethicone 80 MG chewable tablet Commonly known as: MYLICON     TAKE these medications   albuterol (2.5 MG/3ML) 0.083% nebulizer solution Commonly known as: PROVENTIL Take 3 mLs (2.5 mg total) by nebulization every 6 (six) hours as needed for wheezing or shortness of breath.   albuterol 108 (90 Base) MCG/ACT inhaler Commonly known as: VENTOLIN HFA Inhale 2 puffs into the lungs every 6 (six) hours as needed for wheezing or shortness of breath.   aspirin EC 81 MG tablet Take 1 tablet (81 mg total) by mouth daily.   B-D UF III MINI PEN NEEDLES 31G X 5 MM Misc Generic drug: Insulin Pen Needle Use to inject insulin once a day.   Centrum Silver Adult 50+ Tabs Take 1 tablet once a day   clopidogrel 75 MG tablet Commonly known as: PLAVIX Take 1 tablet (75 mg total) by mouth daily. What changed: See the new instructions.   famotidine 40 MG tablet Commonly known as: Pepcid Take 1 tablet (40 mg total) by mouth daily.   fluticasone 50 MCG/ACT nasal spray Commonly known as: FLONASE Place 2 sprays into both nostrils daily as needed for allergies or rhinitis.   gabapentin 800 MG tablet Commonly known as: NEURONTIN TAKE ONE  TABLET BY MOUTH THREE TIMES DAILY ;;   glucose blood test strip Commonly known as: OneTouch Verio Use as instructed to check blood sugar 3 times a day   Insulin Degludec 200 UNIT/ML Sopn Commonly known as: [place] FlexTouch Inject 20 Units into the skin daily.   ipratropium-albuterol 0.5-2.5 (3) MG/3ML Soln Commonly known as: DUONEB Take 3 mLs by nebulization every 6 (six) hours as needed. What changed: reasons to take this   isosorbide mononitrate 30 MG 24 hr tablet Commonly known as: IMDUR Take 0.5 tablets (15 mg total) by mouth daily. *NEEDS OFFICE VISIT*   lidocaine-prilocaine cream Commonly known as: EMLA Apply 1 application  topically as needed. What changed: reasons to take this   metFORMIN 500 MG tablet Commonly known as: GLUCOPHAGE Take 1 tablet (500 mg total) by mouth 2 (two) times daily with a meal.   metoprolol tartrate 25 MG tablet Commonly known as: LOPRESSOR Take 1 tablet (25 mg total) by mouth 2 (two) times daily.   nitroGLYCERIN 0.4 MG SL tablet Commonly known as: Nitrostat Place 1 tablet (0.4 mg total) under the tongue every 5 (five) minutes as needed.   Ozempic (0.25 or 0.5 MG/DOSE) 2 MG/1.5ML Sopn Generic drug: Semaglutide(0.25 or 0.5MG/DOS) Inject 0.5 mg into the skin once a week.   pantoprazole 40 MG tablet Commonly known as: PROTONIX Take 1 tablet (40 mg total) by mouth daily.   rosuvastatin 40 MG tablet Commonly known as: CRESTOR TAKE ONE TABLET BY MOUTH EVERY DAY AT BEDTIME   sertraline 50 MG tablet Commonly known as: ZOLOFT Take 1 tablet (50 mg total) by mouth daily.   sucralfate 1 g tablet Commonly known as: CARAFATE TAKE 1 TABLET BY MOUTH TWICE DAILY   traMADol 50 MG tablet Commonly known as: ULTRAM TAKE TWO TABLETS BY MOUTH THREE TIMES DAILY AS NEEDED FOR MODERATE PAIN What changed:   how much to take  how to take this  when to take this  reasons to take this  additional instructions       Allergies  Allergen Reactions  . Adhesive [Tape] Other (See Comments)    Burn Skin  . Codeine Other (See Comments)    paranoid  . Zocor [Simvastatin - High Dose] Other (See Comments)    Muscle spasms  . Penicillins Rash    Has patient had a PCN reaction causing immediate rash, facial/tongue/throat swelling, SOB or lightheadedness with hypotension: Yes Has patient had a PCN reaction causing severe rash involving mucus membranes or skin necrosis: No Has patient had a PCN reaction that required hospitalization does not remember.  Has patient had a PCN reaction occurring within the last 10 years: No If all of the above answers are "NO", then may proceed with  Cephalosporin use.     Consultations:  Cardiology   Procedures/Studies: Dg Chest 2 View  Result Date: 05/14/2019 CLINICAL DATA:  Fever. EXAM: CHEST - 2 VIEW COMPARISON:  None. FINDINGS: The heart size and mediastinal contours are within normal limits. Right internal jugular Port-A-Cath is noted with tip in expected position of the SVC. Both lungs are clear. The visualized skeletal structures are unremarkable. IMPRESSION: No active cardiopulmonary disease. Electronically Signed   By: Marijo Conception M.D.   On: 05/14/2019 12:53   Dg Chest 2 View  Result Date: 05/12/2019 CLINICAL DATA:  Chest pain EXAM: CHEST - 2 VIEW COMPARISON:  None. FINDINGS: The heart size and mediastinal contours are within normal limits. A right-sided MediPort catheter seen with the tip in mid  SVC. Both lungs are clear. The visualized skeletal structures are unremarkable. IMPRESSION: No acute cardiopulmonary process. Electronically Signed   By: Prudencio Pair M.D.   On: 05/12/2019 23:42   Dg Knee Complete 4 Views Left  Result Date: 04/28/2019 CLINICAL DATA:  Fall, left knee pain. EXAM: LEFT KNEE - COMPLETE 4+ VIEW COMPARISON:  03/07/2019 FINDINGS: No periprosthetic fracture in the femur or tibia. There is some mild cortical discontinuity in the proximal fibular metaphysis which appears increased on the frontal projection compared 03/07/2019 suspicious for a nondisplaced proximal fibular fracture. Expected postoperative appearance of the patella. IMPRESSION: 1. Subtle cortical discontinuity along the proximal fibular metaphysis suspicious for a nondisplaced fracture on the frontal projection. 2. No tibial or femoral fracture, or specific complicating feature of the prosthetic identified. Electronically Signed   By: Van Clines M.D.   On: 04/28/2019 15:24       Discharge Exam: Vitals:   05/16/19 0912 05/16/19 1117  BP: (!) 88/72 93/71  Pulse:  93  Resp: 18 19  Temp:  98.4 F (36.9 C)  SpO2:  98%      General: Pt is alert, awake, not in acute distress Cardiovascular: RRR, S1/S2 +, no edema Respiratory: CTA bilaterally, no wheezing, no rhonchi, no respiratory distress, no conversational dyspnea  Abdominal: Soft, NT, ND, bowel sounds + Extremities: no edema, no cyanosis Psych: Normal mood and affect, stable judgement and insight     The results of significant diagnostics from this hospitalization (including imaging, microbiology, ancillary and laboratory) are listed below for reference.     Microbiology: Recent Results (from the past 240 hour(s))  SARS CORONAVIRUS 2 (TAT 6-24 HRS) Nasopharyngeal Nasopharyngeal Swab     Status: None   Collection Time: 05/13/19 12:10 AM   Specimen: Nasopharyngeal Swab  Result Value Ref Range Status   SARS Coronavirus 2 NEGATIVE NEGATIVE Final    Comment: (NOTE) SARS-CoV-2 target nucleic acids are NOT DETECTED. The SARS-CoV-2 RNA is generally detectable in upper and lower respiratory specimens during the acute phase of infection. Negative results do not preclude SARS-CoV-2 infection, do not rule out co-infections with other pathogens, and should not be used as the sole basis for treatment or other patient management decisions. Negative results must be combined with clinical observations, patient history, and epidemiological information. The expected result is Negative. Fact Sheet for Patients: SugarRoll.be Fact Sheet for Healthcare Providers: https://www.woods-mathews.com/ This test is not yet approved or cleared by the [place] FDA and  has been authorized for detection and/or diagnosis of SARS-CoV-2 by FDA under an Emergency Use Authorization (EUA). This EUA will remain  in effect (meaning this test can be used) for the duration of the COVID-19 declaration under Section 56 4(b)(1) of the Act, 21 U.S.C. section 360bbb-3(b)(1), unless the authorization is terminated or revoked  sooner. Performed at [facility], [place] [address] [place], [place] [address]   MRSA PCR Screening     Status: None   Collection Time: 05/13/19  4:12 PM   Specimen: Nasal Mucosa; Nasopharyngeal  Result Value Ref Range Status   MRSA by PCR NEGATIVE NEGATIVE Final    Comment:        The GeneXpert MRSA Assay (FDA approved for NASAL specimens only), is one component of a comprehensive MRSA colonization surveillance program. It is not intended to diagnose MRSA infection nor to guide or monitor treatment for MRSA infections. Performed at [facility], [place] [address] [place], [place] [address]   Culture, blood (routine x  2)     Status: None (Preliminary result)   Collection Time: 05/14/19  2:37 PM   Specimen: BLOOD RIGHT HAND  Result Value Ref Range Status   Specimen Description BLOOD RIGHT HAND  Final   Special Requests   Final    BOTTLES DRAWN AEROBIC ONLY Blood Culture results may not be optimal due to an inadequate volume of blood received in culture bottles   Culture   Final    NO GROWTH 2 DAYS Performed at [facility], [place] [address], [place], [place] [address]    Report Status PENDING  Incomplete  Culture, blood (routine x 2)     Status: None (Preliminary result)   Collection Time: 05/14/19  2:46 PM   Specimen: BLOOD RIGHT HAND  Result Value Ref Range Status   Specimen Description BLOOD RIGHT HAND  Final   Special Requests   Final    BOTTLES DRAWN AEROBIC ONLY Blood Culture results may not be optimal due to an inadequate volume of blood received in culture bottles   Culture   Final    NO GROWTH 2 DAYS Performed at [facility], [place] [address] [place], [place] [address]    Report Status PENDING  Incomplete  C Difficile Quick Screen w PCR reflex     Status: None   Collection Time: 05/15/19  8:24 PM   Specimen: STOOL  Result Value Ref Range Status   C Diff antigen NEGATIVE NEGATIVE Final   C Diff toxin NEGATIVE NEGATIVE Final   C Diff  interpretation No C. difficile detected.  Final    Comment: Performed at [facility], [place] [address], [place], [place] [address]     Labs: BNP (last 3 results) No results for input(s): BNP in the last 8760 hours. Basic Metabolic Panel: Recent Labs  Lab 05/12/19 2259 05/13/19 1516 05/14/19 0539 05/15/19 0232 05/15/19 1503 05/15/19 1819 05/16/19 0509  NA 139 140 141 142  --   --  140  K 4.8 4.8 4.5 5.6* 4.5 4.6 4.6  CL 110 110 112* 113*  --   --  109  CO2 19* 23 20* 21*  --   --  24  GLUCOSE 441* 261* 172* 121*  --   --  167*  BUN 19 21 21 17   --   --  17  CREATININE 1.56* 1.62* 1.40* 1.57*  --   --  1.24*  CALCIUM 8.3* 8.2* 8.5* 8.8*  --   --  8.3*   Liver Function Tests: Recent Labs  Lab 05/12/19 2259 05/14/19 0539  AST 34 28  ALT 28 26  ALKPHOS 100 88  BILITOT 0.5 0.3  PROT 5.7* 5.9*  ALBUMIN 2.4* 2.4*   Recent Labs  Lab 05/12/19 2259  LIPASE 17   No results for input(s): AMMONIA in the last 168 hours. CBC: Recent Labs  Lab 05/12/19 2259 05/14/19 0539 05/15/19 0232 05/16/19 0509  WBC 1.8* 9.0  9.1 5.8 7.0  NEUTROABS 1.0* 7.1  --   --   HGB 11.5* 11.2*  11.4* 11.8* 10.9*  HCT 35.8* 33.8*  33.5* 35.8* 33.6*  MCV 90.6 86.9  85.2 87.5 88.4  PLT 260 263  255 275 265   Cardiac Enzymes: No results for input(s): CKTOTAL, CKMB, CKMBINDEX, TROPONINI in the last 168 hours. BNP: Invalid input(s): POCBNP CBG: Recent Labs  Lab 05/15/19 1044 05/15/19 1638 05/15/19 2151 05/16/19 0632 05/16/19 1123  GLUCAP 159* 172* 149* 132* 121*   D-Dimer No  results for input(s): DDIMER in the last 72 hours. Hgb A1c No results for input(s): HGBA1C in the last 72 hours. Lipid Profile No results for input(s): CHOL, HDL, LDLCALC, TRIG, CHOLHDL, LDLDIRECT in the last 72 hours. Thyroid function studies No results for input(s): TSH, T4TOTAL, T3FREE, THYROIDAB in the last 72 hours.  Invalid input(s): FREET3 Anemia work up No results for input(s): VITAMINB12,  FOLATE, FERRITIN, TIBC, IRON, RETICCTPCT in the last 72 hours. Urinalysis    Component Value Date/Time   COLORURINE YELLOW 05/14/2019 1422   APPEARANCEUR CLEAR 05/14/2019 1422   LABSPEC 1.008 05/14/2019 1422   LABSPEC 1.015 04/06/2015 0939   PHURINE 6.0 05/14/2019 1422   GLUCOSEU NEGATIVE 05/14/2019 1422   HGBUR NEGATIVE 05/14/2019 1422   BILIRUBINUR NEGATIVE 05/14/2019 1422   BILIRUBINUR 1+ 10/01/2018 1146   KETONESUR NEGATIVE 05/14/2019 1422   PROTEINUR NEGATIVE 05/14/2019 1422   UROBILINOGEN 0.2 10/01/2018 1146   UROBILINOGEN 0.2 04/06/2015 0939   NITRITE NEGATIVE 05/14/2019 1422   LEUKOCYTESUR NEGATIVE 05/14/2019 1422   Sepsis Labs Invalid input(s): PROCALCITONIN,  WBC,  LACTICIDVEN Microbiology Recent Results (from the past 240 hour(s))  SARS CORONAVIRUS 2 (TAT 6-24 HRS) Nasopharyngeal Nasopharyngeal Swab     Status: None   Collection Time: 05/13/19 12:10 AM   Specimen: Nasopharyngeal Swab  Result Value Ref Range Status   SARS Coronavirus 2 NEGATIVE NEGATIVE Final    Comment: (NOTE) SARS-CoV-2 target nucleic acids are NOT DETECTED. The SARS-CoV-2 RNA is generally detectable in upper and lower respiratory specimens during the acute phase of infection. Negative results do not preclude SARS-CoV-2 infection, do not rule out co-infections with other pathogens, and should not be used as the sole basis for treatment or other patient management decisions. Negative results must be combined with clinical observations, patient history, and epidemiological information. The expected result is Negative. Fact Sheet for Patients: SugarRoll.be Fact Sheet for Healthcare Providers: https://www.woods-mathews.com/ This test is not yet approved or cleared by the [place] FDA and  has been authorized for detection and/or diagnosis of SARS-CoV-2 by FDA under an Emergency Use Authorization (EUA). This EUA will remain  in effect (meaning this test  can be used) for the duration of the COVID-19 declaration under Section 56 4(b)(1) of the Act, 21 U.S.C. section 360bbb-3(b)(1), unless the authorization is terminated or revoked sooner. Performed at [facility], [place] [address] [place], [place] [address]   MRSA PCR Screening     Status: None   Collection Time: 05/13/19  4:12 PM   Specimen: Nasal Mucosa; Nasopharyngeal  Result Value Ref Range Status   MRSA by PCR NEGATIVE NEGATIVE Final    Comment:        The GeneXpert MRSA Assay (FDA approved for NASAL specimens only), is one component of a comprehensive MRSA colonization surveillance program. It is not intended to diagnose MRSA infection nor to guide or monitor treatment for MRSA infections. Performed at [facility], [place] [address] [place], [place] [address]   Culture, blood (routine x 2)     Status: None (Preliminary result)   Collection Time: 05/14/19  2:37 PM   Specimen: BLOOD RIGHT HAND  Result Value Ref Range Status   Specimen Description BLOOD RIGHT HAND  Final   Special Requests   Final    BOTTLES DRAWN AEROBIC ONLY Blood Culture results may not be optimal due to an inadequate volume of blood received in culture bottles   Culture   Final    NO GROWTH 2 DAYS Performed  at [facility], [place] [address], [place], [place] [address]    Report Status PENDING  Incomplete  Culture, blood (routine x 2)     Status: None (Preliminary result)   Collection Time: 05/14/19  2:46 PM   Specimen: BLOOD RIGHT HAND  Result Value Ref Range Status   Specimen Description BLOOD RIGHT HAND  Final   Special Requests   Final    BOTTLES DRAWN AEROBIC ONLY Blood Culture results may not be optimal due to an inadequate volume of blood received in culture bottles   Culture   Final    NO GROWTH 2 DAYS Performed at [facility], [place] [address], [place], [place] [address]    Report Status PENDING  Incomplete  C Difficile Quick Screen w PCR reflex     Status: None    Collection Time: 05/15/19  8:24 PM   Specimen: STOOL  Result Value Ref Range Status   C Diff antigen NEGATIVE NEGATIVE Final   C Diff toxin NEGATIVE NEGATIVE Final   C Diff interpretation No C. difficile detected.  Final    Comment: Performed at [facility], [place] [address] [place], [place] [address]     Patient was seen and examined on the day of discharge and was found to be in stable condition. Time coordinating discharge: 25 minutes including assessment and coordination of care, as well as examination of the patient.   SIGNED:  Dessa Phi, DO [facility] 05/16/2019, 11:52 AM

## 2019-05-16 NOTE — Consult Note (Signed)
   [facility] Inpatient Consult   05/16/2019  [name] [birth year] [id number]  Patient screened for extreme high risk score for unplanned readmission score  and/or for hospitalizations to check if potential [place] Management services are needed.  Review of patient's medical record reveals patient is per MD note:  [name] is a 68 y.o.femalewith medical history significant ofHTN, HLD, DM, TIA, CAD, s/p of DES, ashtma, depression, sickle cell trait,stomach cancer (s/p surgery, radiation and chemotherapy, not on medication currently), who presents with chest pain. Patient states that her chest pain started at about 8 PM, which is located in the substernal area, initially 10 out of 10 in severity, currently 4 out of 10 in severity, pressure like, radiating to the neck, not pleuritic, not aggravated by deep breath.  Patient for diabetes follow up with Hgb A1C is 10.0.   Primary Care Provider is  Roma Schanz, MD, this office provides the transition of care follow up.  Plan: Attempts to reach patient by hospital phone and personal cell phone listed without success. Received a text message for return request. Generic information sent.  Will refer to [facility] RN for post hospital diabetes complex disease management.   Please place a [facility] Care Management consult as appropriate and for questions contact:   [name], RN BSN [facility] Liaison  [phone] business mobile phone Toll free office [phone]  Fax number: [phone] [name]

## 2019-05-16 NOTE — Discharge Instructions (Signed)

## 2019-05-17 ENCOUNTER — Telehealth: Payer: Self-pay | Admitting: *Deleted

## 2019-05-17 ENCOUNTER — Other Ambulatory Visit: Payer: Self-pay | Admitting: *Deleted

## 2019-05-17 ENCOUNTER — Other Ambulatory Visit: Payer: Self-pay

## 2019-05-17 NOTE — Telephone Encounter (Signed)
Transition Care Management Follow-up Telephone Call   Date discharged? 05/16/19   How have you been since you were released from the hospital? Doing well   Do you understand why you were in the hospital? yes   Do you understand the discharge instructions? yes   Where were you discharged to?  Granddaughter is with her.   Items Reviewed:  Medications reviewed: pt states she will bring list to PCP appt.  Allergies reviewed: yes  Dietary changes reviewed: yes, no salt  Referrals reviewed: yes   Functional Questionnaire:   Activities of Daily Living (ADLs):   She states they are independent in the following: ambulation, bathing and hygiene, feeding, continence, grooming, toileting and dressing States they require assistance with the following: na   Any transportation issues/concerns?: no   Any patient concerns? no   Confirmed importance and date/time of follow-up visits scheduled yes  Provider Appointment booked with PCP 10/29  Confirmed with patient if condition begins to worsen call PCP or go to the ER.  Patient was given the office number and encouraged to call back with question or concerns.  : yes

## 2019-05-17 NOTE — Patient Outreach (Signed)
Telephone outreach was unsuccessful. I left a message and requested a return call. Advised pt that if I did not hear from her, I will call her back on Monday.  [name]. Myrtie Neither, MSN, [facility] Gerontological Nurse Practitioner [facility] Care Management [phone]

## 2019-05-19 LAB — GI PATHOGEN PANEL BY PCR, STOOL
Adenovirus F 40/41: NOT DETECTED
Astrovirus: NOT DETECTED
Campylobacter by PCR: NOT DETECTED
Cryptosporidium by PCR: NOT DETECTED
Cyclospora cayetanensis: NOT DETECTED
E coli (ETEC) LT/ST: NOT DETECTED
E coli (STEC): NOT DETECTED
Entamoeba histolytica: NOT DETECTED
Enteroaggregative E coli: NOT DETECTED
Enteropathogenic E coli: DETECTED — AB
G lamblia by PCR: NOT DETECTED
Norovirus GI/GII: NOT DETECTED
Plesiomonas shigelloides: NOT DETECTED
Rotavirus A by PCR: NOT DETECTED
Salmonella by PCR: NOT DETECTED
Sapovirus: NOT DETECTED
Shigella by PCR: NOT DETECTED
Vibrio cholerae: NOT DETECTED
Vibrio: NOT DETECTED
Yersinia enterocolitica: NOT DETECTED

## 2019-05-19 LAB — CULTURE, BLOOD (ROUTINE X 2)
Culture: NO GROWTH
Culture: NO GROWTH

## 2019-05-20 ENCOUNTER — Encounter: Payer: Self-pay | Admitting: *Deleted

## 2019-05-20 ENCOUNTER — Telehealth: Payer: Self-pay | Admitting: Pharmacist

## 2019-05-20 ENCOUNTER — Other Ambulatory Visit: Payer: Self-pay

## 2019-05-20 ENCOUNTER — Other Ambulatory Visit: Payer: Self-pay | Admitting: *Deleted

## 2019-05-20 NOTE — Patient Outreach (Signed)
New pt referred from her primary care office.  [name] agreed to participate with me. We will concentrate on reducing falls and diabetes control.  Patient was recently discharged from hospital and all medications have been reviewed. Outpatient Encounter Medications as of 05/20/2019  Medication Sig Note  . albuterol (PROVENTIL HFA;VENTOLIN HFA) 108 (90 Base) MCG/ACT inhaler Inhale 2 puffs into the lungs every 6 (six) hours as needed for wheezing or shortness of breath.   Marland Kitchen albuterol (PROVENTIL) (2.5 MG/3ML) 0.083% nebulizer solution Take 3 mLs (2.5 mg total) by nebulization every 6 (six) hours as needed for wheezing or shortness of breath.   Marland Kitchen aspirin EC 81 MG tablet Take 1 tablet (81 mg total) by mouth daily.   . clopidogrel (PLAVIX) 75 MG tablet Take 1 tablet (75 mg total) by mouth daily.   . famotidine (PEPCID) 40 MG tablet Take 1 tablet (40 mg total) by mouth daily.   Marland Kitchen gabapentin (NEURONTIN) 800 MG tablet TAKE ONE TABLET BY MOUTH THREE TIMES DAILY ;; (Patient taking differently: Take 800 mg by mouth 3 (three) times daily. )   . glucose blood (ONETOUCH VERIO) test strip Use as instructed to check blood sugar 3 times a day   . Insulin Degludec (TRESIBA FLEXTOUCH) 200 UNIT/ML SOPN Inject 20 Units into the skin daily.   . metFORMIN (GLUCOPHAGE) 500 MG tablet Take 1 tablet (500 mg total) by mouth 2 (two) times daily with a meal.   . metoprolol tartrate (LOPRESSOR) 25 MG tablet Take 1 tablet (25 mg total) by mouth 2 (two) times daily.   . Multiple Vitamins-Minerals (CENTRUM [place]IL[place] ADULT 50+) TABS Take 1 tablet once a day   . pantoprazole (PROTONIX) 40 MG tablet Take 1 tablet (40 mg total) by mouth daily.   . rosuvastatin (CRESTOR) 40 MG tablet TAKE ONE TABLET BY MOUTH EVERY DAY AT BEDTIME (Patient taking differently: Take 40 mg by mouth at bedtime. )   . sertraline (ZOLOFT) 50 MG tablet Take 1 tablet (50 mg total) by mouth daily.   . sucralfate (CARAFATE) 1 g tablet TAKE 1 TABLET BY MOUTH TWICE  DAILY (Patient taking differently: Take 1 g by mouth 2 (two) times daily. )   . traMADol (ULTRAM) 50 MG tablet TAKE TWO TABLETS BY MOUTH THREE TIMES DAILY AS NEEDED FOR MODERATE PAIN (Patient taking differently: Take 100 mg by mouth 3 (three) times daily as needed for moderate pain. )   . Insulin Pen Needle (B-D UF III MINI PEN NEEDLES) 31G X 5 MM MISC Use to inject insulin once a day.   . ipratropium-albuterol (DUONEB) 0.5-2.5 (3) MG/3ML SOLN Take 3 mLs by nebulization every 6 (six) hours as needed. (Patient taking differently: Take 3 mLs by nebulization every 6 (six) hours as needed (sob and wheezing). )   . isosorbide mononitrate (IMDUR) 30 MG 24 hr tablet Take 0.5 tablets (15 mg total) by mouth daily. *NEEDS OFFICE VISIT*   . lidocaine-prilocaine (EMLA) cream Apply 1 application topically as needed. (Patient taking differently: Apply 1 application topically as needed (to numb port site.). ) 05/20/2019: Pt takes 10 or less once in the am per [name].  . nitroGLYCERIN (NITROSTAT) 0.4 MG SL tablet Place 1 tablet (0.4 mg total) under the tongue every 5 (five) minutes as needed. (Patient not taking: Reported on 05/20/2019)   . Semaglutide,0.25 or 0.5MG/DOS, (OZEMPIC, 0.25 OR 0.5 MG/DOSE,) 2 MG/1.5ML SOPN Inject 0.5 mg into the skin once a week. (Patient not taking: Reported on 05/20/2019) 05/20/2019: Not taking  will ask [facility] pharmacy.  . [DISCONTINUED] fluticasone (FLONASE) 50 MCG/ACT nasal spray Place 2 sprays into both nostrils daily as needed for allergies or rhinitis. (Patient not taking: Reported on 05/20/2019)    Facility-Administered Encounter Medications as of 05/20/2019  Medication  . sodium chloride flush (NS) 0.9 % injection 10 mL   Fall Risk  05/20/2019 05/08/2019 09/13/2016 06/21/2016 06/21/2016  Falls in the past year? 1 1 Yes Yes Yes  Number falls in past yr: 1 1 2  or more 2 or more 2 or more  Comment - - - - pt report falling due to low b/p  Injury with Fall? 1 1 No No No  Comment -  - - - -  Risk Factor Category  - - High Fall Risk High Fall Risk -  Risk for fall due to : Impaired balance/gait;History of fall(s);Impaired mobility - - History of fall(s);Impaired balance/gait -  Risk for fall due to: Comment - - - - -  Follow up Falls evaluation completed - - - -   Depression screen [facility] 2/9 05/20/2019 08/20/2018 06/21/2016 06/21/2016  Decreased Interest 1 1 0 0  Down, Depressed, Hopeless 0 1 0 0  PHQ - 2 Score 1 2 0 0  Some recent data might be hidden   THN CM Care Plan Problem One     Most Recent Value  Care Plan Problem One  Frequency of falls.  Role Documenting the Problem One  Care Management [place] for Problem One  Active  THN Long Term Goal   Pt will not fall and sustain an injury warranting ED visit over the next 60 days.  THN Long Term Goal Start Date  05/20/19  Interventions for Problem One Long Term Goal  Pt in new apt, with unpacked boxes, has a path. Doesn't use her walker in the house. Pt recognizes her higher risk for falls due to her hx. Discussed need to get family to move boxes and unpack her necessary items. Use her walker in the home. Get up and stand for a minute before walking. Don't take any risks!    THN CM Care Plan Problem Two     Most Recent Value  Care Plan Problem Two  Diabetes not at goal (HgbA1C 10.0)  Role Documenting the Problem Two  Care Management [place] for Problem Two  Active  Interventions for Problem Two Long Term Goal   Discussed diet, assessed knowledge of carbs,discussed need to get additional medication that she does not have. Will request pharm assist from [facility].  THN Long Term Goal  Pt hgb A1C will be less then 10.0 on next check within the next 60 days.  THN Long Term Goal Start Date  05/20/19  [facility] CM Short Term Goal #1   Pt will read and discuss All About Carbs with nurse during this month until the end of November.  THN CM Short Term Goal #1 Start Date  05/20/19  Interventions for Short Term  Goal #2   Provide Educational Material     I will call her in one week for follow up. Making referrals to SW for adult housing and to pharmacy for [facility] assistance.  Eulah Pont. Myrtie Neither, MSN, [facility] Gerontological Nurse Practitioner [facility] Care Management [phone]

## 2019-05-20 NOTE — Patient Outreach (Signed)
[facility]) Care Management  05/20/2019  [name] 29-Oct-1950 [id number]   Patient was called regarding medication assistance with Ozempic. Unfortunately, she did not answer her phone. HIPAA compliant message was left on her voicemail.  Called [facility] to check on the price of Ozempic since the referral stated the patient has LIS. The Pharmacist at [facility] reported the patient paid $47 04/22/2019 for Ozempic.  Plan: Send patient an unsuccessful outreach letter. Call patient back in 5-7 business days.  Elayne Guerin, PharmD, [place] Clinical Pharmacist [phone]

## 2019-05-21 ENCOUNTER — Other Ambulatory Visit: Payer: Self-pay

## 2019-05-21 ENCOUNTER — Telehealth: Payer: Self-pay | Admitting: Pharmacist

## 2019-05-21 NOTE — Telephone Encounter (Signed)
-----   Message from [employer] sent at 05/21/2019 11:43 AM EDT ----- Delsa Sale.  I was just able to connect with [name].  She requested that you call her today if you are able.  I told her I couldn't speak to your availability but I would pass on the message. Have a good day! Museum/gallery conservator

## 2019-05-21 NOTE — Patient Outreach (Signed)
[facility]) Care Management  05/21/2019  ELLYSIA CHAR 09-22-1950 [id number]   Social Work referral received from NP, Deloria Lair, to send list of senior housing options to patient. Successful outreach to patient today.  Patient reports that current lease is up in August 2021.  Informed patient that senior housing facilities typically have wait lists so it's good that she is being proactive.  Will mail list of housing options.  Informed patient that [facility] Pharmacist, Denyse Amass, attempted to reach her yesterday.  Patient requested that Alwyn Ren call her again today if available; in-basket message sent. Will follow up with patient within the next two weeks to ensure receipt of housing resources.  Ronn Melena, BSW Social Worker [phone]

## 2019-05-21 NOTE — Patient Outreach (Signed)
[facility]) Care Management  Jessica   [birth year]Jefferson  [name] 07/12/1951 [id number]  Reason for referral: medication assistance  Referral source:  Referral medication(s): Ozempic/Tresiba Current insurance: [employer]  HPI:  Patient is a 68 year old female with multiple medical conditions including but not limited to:  Asthma, CAD, chronic pain, CKD stage III, and type 2 diabetes with diabetic polyneuropathy.  Objective: Allergies  Allergen Reactions  . Adhesive [Tape] Other (See Comments)    Burn Skin  . Codeine Other (See Comments)    paranoid  . Zocor [Simvastatin - High Dose] Other (See Comments)    Muscle spasms  . Penicillins Rash    Has patient had a PCN reaction causing immediate rash, facial/tongue/throat swelling, SOB or lightheadedness with hypotension: Yes Has patient had a PCN reaction causing severe rash involving mucus membranes or skin necrosis: No Has patient had a PCN reaction that required hospitalization does not remember.  Has patient had a PCN reaction occurring within the last 10 years: No If all of the above answers are "NO", then may proceed with Cephalosporin use.     Medications Reviewed Today    Reviewed by [name], [facility] (Pharmacist) on 05/21/19 at [place] List Status: <None>  Medication Order Taking? Sig Documenting Provider Last Dose Status Informant  albuterol (PROVENTIL HFA;VENTOLIN HFA) 108 (90 Base) MCG/ACT inhaler [id number] Yes Inhale 2 puffs into the lungs every 6 (six) hours as needed for wheezing or shortness of breath. Jessica Hockey, Jefferson Taking Active Self  albuterol (PROVENTIL) (2.5 MG/3ML) 0.083% nebulizer solution [id number] Yes Take 3 mLs (2.5 mg total) by nebulization every 6 (six) hours as needed for wheezing or shortness of breath. Jessica Hockey, Jefferson Taking Active Self  aspirin EC 81 MG tablet [id number] Yes Take 1 tablet (81 mg total) by mouth daily. Jessica Held, Jefferson Taking Active Self   clopidogrel (PLAVIX) 75 MG tablet [id number] Yes Take 1 tablet (75 mg total) by mouth daily. [name] Taking Active   famotidine (PEPCID) 40 MG tablet [id number] Yes Take 1 tablet (40 mg total) by mouth daily. Jessica Held, Jefferson Taking Active Self  gabapentin (NEURONTIN) 800 MG tablet [id number] Yes TAKE ONE TABLET BY MOUTH THREE TIMES DAILY ;;  Patient taking differently: Take 800 mg by mouth 3 (three) times daily.    Jessica Held, Jefferson Taking Active Self  glucose blood (ONETOUCH VERIO) test strip [id number] Yes Use as instructed to check blood sugar 3 times a day Jessica Kingdom, Jefferson Taking Active Self  Insulin Degludec (TRESIBA FLEXTOUCH) 200 UNIT/ML SOPN [id number] Yes Inject 20 Units into the skin daily. Jessica Kingdom, Jefferson Taking Active Self  Insulin Pen Needle (B-D UF III MINI PEN NEEDLES) 31G X 5 MM MISC [id number] Yes Use to inject insulin once a day. Jessica Kingdom, Jefferson Taking Active Self  ipratropium-albuterol (DUONEB) 0.5-2.5 (3) MG/3ML SOLN [id number] Yes Take 3 mLs by nebulization every 6 (six) hours as needed.  Patient taking differently: Take 3 mLs by nebulization every 6 (six) hours as needed (sob and wheezing).    Jessica Held, Jefferson Taking Active Self  isosorbide mononitrate (IMDUR) 30 MG 24 hr tablet [id number] Yes Take 0.5 tablets (15 mg total) by mouth daily. *NEEDS OFFICE VISITPurcell Nails, Phill Myron, Jefferson Taking Active Self  lidocaine-prilocaine (EMLA) cream [id number] Yes Apply 1 application topically as needed.  Patient taking differently: Apply 1 application topically as needed (to numb port  site.).    Jessica Napoleon, Jefferson Taking Active Self           Med Note [name] Oct 26, Jefferson 11:44 AM) Pt takes 10 or less once in the am per Dr. Etter Jefferson.  metFORMIN (GLUCOPHAGE) 500 MG tablet [id number] Yes Take 1 tablet (500 mg total) by mouth 2 (two) times daily with a meal. Jessica Kingdom, Jefferson Taking Active Self  metoprolol tartrate (LOPRESSOR)  25 MG tablet [id number] Yes Take 1 tablet (25 mg total) by mouth 2 (two) times daily. Jessica Harp, Jefferson Taking Active Self           Med Note [name]   Mon Oct 19, Jefferson 12:49 AM)    Multiple Vitamins-Minerals (CENTRUM [place] ADULT 50+) TABS [id number] Yes Take 1 tablet once a day [name], [name] Taking Active Self  nitroGLYCERIN (NITROSTAT) 0.4 MG SL tablet [id number] Yes Place 1 tablet (0.4 mg total) under the tongue every 5 (five) minutes as needed. Jessica Harp, Jefferson Taking Active Self  pantoprazole (PROTONIX) 40 MG tablet [id number] Yes Take 1 tablet (40 mg total) by mouth daily. Jessica Held, Jefferson Taking Active Self  rosuvastatin (CRESTOR) 40 MG tablet [id number] Yes TAKE ONE TABLET BY MOUTH EVERY DAY AT BEDTIME  Patient taking differently: Take 40 mg by mouth at bedtime.    [name] Taking Active Self  Semaglutide,0.25 or 0.5MG/DOS, (OZEMPIC, 0.25 OR 0.5 MG/DOSE,) 2 MG/1.5ML SOPN [id number] Yes Inject 0.5 mg into the skin once a week. Jessica Kingdom, Jefferson Taking Active Self           Med Note [name]  3:47 PM)    sertraline (ZOLOFT) 50 MG tablet [id number] Yes Take 1 tablet (50 mg total) by mouth daily. Jessica Held, Jefferson Taking Active Self  sucralfate (CARAFATE) 1 g tablet [id number] Yes TAKE 1 TABLET BY MOUTH TWICE DAILY  Patient taking differently: Take 1 g by mouth 2 (two) times daily.    [name] Taking Active Self  traMADol (ULTRAM) 50 MG tablet [id number] Yes TAKE TWO TABLETS BY MOUTH THREE TIMES DAILY AS NEEDED FOR MODERATE PAIN  Patient taking differently: Take 100 mg by mouth 3 (three) times daily as needed for moderate pain.    Jessica Held, Jefferson Taking Active Self          Assessment:  Drugs sorted by system:  Neurologic/Psychologic: Gabapentin, Sertraline  Pulmonary: Albuterol HFA, Albuterol Nebulizer Solution  Cardiovascular: Clopidogrel, Aspirin, Isosorbide  Mononitrate, Metoprolol, Nitroglycerin, Rosuvastatin  Gastrointestinal: Famotidine, Pantoprazole  Endocrine: Tresiba, Metformin, Ozempic  Topical: [place]  Pain: Tramadol  Vitamins/Minerals/Supplements: Multiple Vitamin   Medication Review Findings:  Marland Kitchen GFR 52 ml/min (Dose too High) o Max recommended dose of Gabapentin in patients with GF  Between 50-70 ml/min is 1800 mg/day.  Patient is taking 2400 mg daily.  . Increased risk of CNS depression and fall risk due to : combination of gabapentin and tramadol as well as the patient's renal impairment.  Medication Assistance Findings:  Medication assistance needs identified: [place] and Ozempic  Patient has partial Extra Help.  She turned down a coupon for [place] but said she was interested in patient assistance.  She appears to qualify for both [place] and Ozempic through [employer] patient assistance program.  Patient was educated about the necessary financial documentation.   Additional medication assistance options reviewed with patient as  warranted:  No other options identified  Plan: I will route patient assistance letter to [place] technician who will coordinate patient assistance program application process for medications listed above.  [facility] pharmacy technician will assist with obtaining all required documents from both patient and provider(s) and submit application(s) once completed.    [name], PharmD, [place] Clinical Pharmacist [phone]

## 2019-05-22 ENCOUNTER — Other Ambulatory Visit: Payer: Self-pay

## 2019-05-23 ENCOUNTER — Ambulatory Visit (INDEPENDENT_AMBULATORY_CARE_PROVIDER_SITE_OTHER): Payer: Medicare HMO | Admitting: Family Medicine

## 2019-05-23 ENCOUNTER — Encounter: Payer: Self-pay | Admitting: Family Medicine

## 2019-05-23 ENCOUNTER — Inpatient Hospital Stay: Payer: Self-pay | Admitting: Family Medicine

## 2019-05-23 ENCOUNTER — Other Ambulatory Visit: Payer: Self-pay

## 2019-05-23 VITALS — BP 100/60 | HR 93 | Temp 97.9°F | Resp 18 | Ht 66.0 in | Wt 208.4 lb

## 2019-05-23 DIAGNOSIS — E785 Hyperlipidemia, unspecified: Secondary | ICD-10-CM

## 2019-05-23 DIAGNOSIS — R197 Diarrhea, unspecified: Secondary | ICD-10-CM | POA: Diagnosis not present

## 2019-05-23 DIAGNOSIS — G4700 Insomnia, unspecified: Secondary | ICD-10-CM | POA: Diagnosis not present

## 2019-05-23 DIAGNOSIS — L231 Allergic contact dermatitis due to adhesives: Secondary | ICD-10-CM | POA: Diagnosis not present

## 2019-05-23 DIAGNOSIS — E1169 Type 2 diabetes mellitus with other specified complication: Secondary | ICD-10-CM

## 2019-05-23 DIAGNOSIS — Z23 Encounter for immunization: Secondary | ICD-10-CM | POA: Diagnosis not present

## 2019-05-23 DIAGNOSIS — J40 Bronchitis, not specified as acute or chronic: Secondary | ICD-10-CM | POA: Diagnosis not present

## 2019-05-23 DIAGNOSIS — R079 Chest pain, unspecified: Secondary | ICD-10-CM

## 2019-05-23 LAB — COMPREHENSIVE METABOLIC PANEL
ALT: 26 U/L (ref 0–35)
AST: 28 U/L (ref 0–37)
Albumin: 3.1 g/dL — ABNORMAL LOW (ref 3.5–5.2)
Alkaline Phosphatase: 146 U/L — ABNORMAL HIGH (ref 39–117)
BUN: 31 mg/dL — ABNORMAL HIGH (ref 6–23)
CO2: 26 mEq/L (ref 19–32)
Calcium: 8.9 mg/dL (ref 8.4–10.5)
Chloride: 107 mEq/L (ref 96–112)
Creatinine, Ser: 1.83 mg/dL — ABNORMAL HIGH (ref 0.40–1.20)
GFR: 33.3 mL/min — ABNORMAL LOW (ref >60.00)
Glucose, Bld: 135 mg/dL — ABNORMAL HIGH (ref 70–99)
Potassium: 5.4 mEq/L — ABNORMAL HIGH (ref 3.5–5.1)
Sodium: 140 mEq/L (ref 135–145)
Total Bilirubin: 0.5 mg/dL (ref 0.2–1.2)
Total Protein: 6.7 g/dL (ref 6.0–8.3)

## 2019-05-23 LAB — LIPID PANEL
Cholesterol: 57 mg/dL (ref 0–200)
HDL: 32.8 mg/dL — ABNORMAL LOW (ref >39.00)
LDL Cholesterol: 11 mg/dL (ref 0–99)
NonHDL: 24.16
Total CHOL/HDL Ratio: 2
Triglycerides: 67 mg/dL (ref 0.0–149.0)
VLDL: 13.4 mg/dL (ref 0.0–40.0)

## 2019-05-23 MED ORDER — HYOSCYAMINE SULFATE SL 0.125 MG SL SUBL: SUBLINGUAL_TABLET | SUBLINGUAL | 0 refills | Status: DC

## 2019-05-23 MED ORDER — TRIAMCINOLONE ACETONIDE 0.1 % EX CREA: 1.0000 "application " | TOPICAL_CREAM | Freq: Two times a day (BID) | CUTANEOUS | 0 refills | Status: DC

## 2019-05-23 MED ORDER — TRAZODONE HCL 50 MG PO TABS: 25.0000 mg | ORAL_TABLET | Freq: Every evening | ORAL | 3 refills | Status: DC | PRN

## 2019-05-23 MED ORDER — AZITHROMYCIN 250 MG PO TABS: ORAL_TABLET | ORAL | 0 refills | Status: DC

## 2019-05-23 NOTE — Progress Notes (Signed)
Patient ID: [name], female    DOB: 1951/02/23  Age: 68 y.o. MRN: [id number]    Subjective:  Subjective  HPI KARLISSA ARON presents for hosp f/u for chest pain---10/18-10/22 The chest pain has resolved but she still has abd cramping and occasional diarrhea ----- it has occurred 1 x since d/c-- she will make f/u with GI  She also c/o insomnia-- she just can not get her brain to shut off at night  She also broke out on her chest from the ekg leads  Pt also c/o cough with green mucus--- she has been tested several times for covid-- all negative  No fevers , no nasal congestion Review of Systems  Constitutional: Negative for appetite change, diaphoresis, fatigue and unexpected weight change.  Eyes: Negative for pain, redness and visual disturbance.  Respiratory: Positive for cough. Negative for chest tightness, shortness of breath and wheezing.   Cardiovascular: Negative for chest pain, palpitations and leg swelling.  Gastrointestinal: Positive for abdominal pain and diarrhea. Negative for abdominal distention, blood in stool, constipation, nausea and rectal pain.  Endocrine: Negative for cold intolerance, heat intolerance, polydipsia, polyphagia and polyuria.  Genitourinary: Negative for difficulty urinating, dysuria and frequency.  Neurological: Negative for dizziness, light-headedness, numbness and headaches.    History Past Medical History:  Diagnosis Date  . Asthma   . Degenerative joint disease   . Diabetes mellitus   . Dyspnea   . GERD (gastroesophageal reflux disease)   . Hyperlipidemia    no longer on medication for this  . Hypertension    patient denies  . Myocardial infarction ([place]) 08/2018  . Neuropathy    bilateral hands and feet  . Sickle cell trait ([place])   . Sleep apnea   . stomach ca dx'd 2010   chemo/xrt comp 05/2009  . TIA (transient ischemic attack) 07/2015    She has a past surgical history that includes Total knee arthroplasty; Total hip arthroplasty;  Shoulder surgery; Appendectomy; Cesarean section; Hernia repair; Esophagogastroduodenoscopy (N/A, 10/15/2012); Esophagogastroduodenoscopy (N/A, 01/15/2016); Colon surgery; Colonoscopy with propofol (N/A, 06/01/2016); Coronary/Graft Acute MI Revascularization (N/A, 08/27/2018); LEFT HEART CATH AND CORONARY ANGIOGRAPHY (N/A, 08/27/2018); and CORONARY STENT INTERVENTION (N/A, 08/29/2018).   Her family history includes Alzheimer's disease (age of onset: 66) in her father; Cancer in her maternal grandfather; Cancer (age of onset: 26) in her mother; Diabetes in her brother and maternal grandmother; Healthy in her child.She reports that she quit smoking about 30 years ago. Her smoking use included cigarettes. She started smoking about 45 years ago. She has a 15.00 pack-year smoking history. She has never used smokeless tobacco. She reports that she does not drink alcohol or use drugs.  Current Outpatient Medications on File Prior to Visit  Medication Sig Dispense Refill  . albuterol (PROVENTIL HFA;VENTOLIN HFA) 108 (90 Base) MCG/ACT inhaler Inhale 2 puffs into the lungs every 6 (six) hours as needed for wheezing or shortness of breath. 1 Inhaler 2  . albuterol (PROVENTIL) (2.5 MG/3ML) 0.083% nebulizer solution Take 3 mLs (2.5 mg total) by nebulization every 6 (six) hours as needed for wheezing or shortness of breath. 75 mL 12  . aspirin EC 81 MG tablet Take 1 tablet (81 mg total) by mouth daily. 90 tablet 3  . clopidogrel (PLAVIX) 75 MG tablet Take 1 tablet (75 mg total) by mouth daily.    . famotidine (PEPCID) 40 MG tablet Take 1 tablet (40 mg total) by mouth daily. 30 tablet 3  . gabapentin (NEURONTIN) 800 MG  tablet TAKE ONE TABLET BY MOUTH THREE TIMES DAILY ;; (Patient taking differently: Take 800 mg by mouth 3 (three) times daily. ) 90 tablet 2  . glucose blood (ONETOUCH VERIO) test strip Use as instructed to check blood sugar 3 times a day 300 each 12  . Insulin Degludec (TRESIBA FLEXTOUCH) 200 UNIT/ML SOPN Inject  20 Units into the skin daily. 3 pen 3  . Insulin Pen Needle (B-D UF III MINI PEN NEEDLES) 31G X 5 MM MISC Use to inject insulin once a day. 100 each 11  . ipratropium-albuterol (DUONEB) 0.5-2.5 (3) MG/3ML SOLN Take 3 mLs by nebulization every 6 (six) hours as needed. (Patient taking differently: Take 3 mLs by nebulization every 6 (six) hours as needed (sob and wheezing). ) 360 mL 2  . isosorbide mononitrate (IMDUR) 30 MG 24 hr tablet Take 0.5 tablets (15 mg total) by mouth daily. *NEEDS OFFICE VISIT* 15 tablet 0  . lidocaine-prilocaine (EMLA) cream Apply 1 application topically as needed. (Patient taking differently: Apply 1 application topically as needed (to numb port site.). ) 30 g 0  . metFORMIN (GLUCOPHAGE) 500 MG tablet Take 1 tablet (500 mg total) by mouth 2 (two) times daily with a meal. 180 tablet 3  . metoprolol tartrate (LOPRESSOR) 25 MG tablet Take 1 tablet (25 mg total) by mouth 2 (two) times daily. 180 tablet 2  . Multiple Vitamins-Minerals (CENTRUM SILVER ADULT 50+) TABS Take 1 tablet once a day 90 tablet 1  . nitroGLYCERIN (NITROSTAT) 0.4 MG SL tablet Place 1 tablet (0.4 mg total) under the tongue every 5 (five) minutes as needed. 25 tablet 3  . pantoprazole (PROTONIX) 40 MG tablet Take 1 tablet (40 mg total) by mouth daily. 90 tablet 1  . rosuvastatin (CRESTOR) 40 MG tablet TAKE ONE TABLET BY MOUTH EVERY DAY AT BEDTIME (Patient taking differently: Take 40 mg by mouth at bedtime. ) 180 tablet 1  . Semaglutide,0.25 or 0.5MG/DOS, (OZEMPIC, 0.25 OR 0.5 MG/DOSE,) 2 MG/1.5ML SOPN Inject 0.5 mg into the skin once a week. 2 pen 5  . sertraline (ZOLOFT) 50 MG tablet Take 1 tablet (50 mg total) by mouth daily. 90 tablet 1  . sucralfate (CARAFATE) 1 g tablet TAKE 1 TABLET BY MOUTH TWICE DAILY (Patient taking differently: Take 1 g by mouth 2 (two) times daily. ) 60 tablet 10  . traMADol (ULTRAM) 50 MG tablet TAKE TWO TABLETS BY MOUTH THREE TIMES DAILY AS NEEDED FOR MODERATE PAIN (Patient taking  differently: Take 100 mg by mouth 3 (three) times daily as needed for moderate pain. ) 180 tablet 4   Current Facility-Administered Medications on File Prior to Visit  Medication Dose Route Frequency Provider Last Rate Last Dose  . sodium chloride flush (NS) 0.9 % injection 10 mL  10 mL Intravenous PRN [place], Holli Humbles, NP   10 mL at 09/13/16 1146     Objective:  Objective  Physical Exam Vitals signs and nursing note reviewed.  Constitutional:      Appearance: She is well-developed.  HENT:     Head: Normocephalic and atraumatic.  Eyes:     Conjunctiva/sclera: Conjunctivae normal.  Neck:     Musculoskeletal: Normal range of motion and neck supple.     Thyroid: No thyromegaly.     Vascular: No carotid bruit or JVD.  Cardiovascular:     Rate and Rhythm: Normal rate and regular rhythm.     Heart sounds: Normal heart sounds. No murmur.  Pulmonary:     Effort:  Pulmonary effort is normal. No respiratory distress.     Breath sounds: Normal breath sounds. No wheezing or rales.  Chest:     Chest wall: No tenderness.  Neurological:     Mental Status: She is alert and oriented to person, place, and time.    BP 100/60 (BP Location: Right Arm, Patient Position: Sitting, Cuff Size: Normal)   Pulse 93   Temp 97.9 F (36.6 C) (Temporal)   Resp 18   Ht 5' 6"  (1.676 m)   Wt 208 lb 6.4 oz (94.5 kg)   SpO2 98%   BMI 33.64 kg/m  Wt Readings from Last 3 Encounters:  05/23/19 208 lb 6.4 oz (94.5 kg)  05/13/19 215 lb 6.2 oz (97.7 kg)  05/08/19 216 lb 6.4 oz (98.2 kg)     Lab Results  Component Value Date   WBC 7.0 05/16/2019   HGB 10.9 (L) 05/16/2019   HCT 33.6 (L) 05/16/2019   PLT 265 05/16/2019   GLUCOSE 167 (H) 05/16/2019   CHOL 75 05/13/2019   TRIG 54 05/13/2019   HDL 46 05/13/2019   LDLDIRECT 136.0 08/20/2018   LDLCALC 18 05/13/2019   ALT 26 05/14/2019   AST 28 05/14/2019   NA 140 05/16/2019   K 4.6 05/16/2019   CL 109 05/16/2019   CREATININE 1.24 (H) 05/16/2019    BUN 17 05/16/2019   CO2 24 05/16/2019   TSH 3.81 10/01/2018   INR 1.1 03/22/2019   HGBA1C 10.0 (H) 05/13/2019   MICROALBUR <0.7 08/20/2018    Dg Chest 2 View  Result Date: 05/12/2019 CLINICAL DATA:  Chest pain EXAM: CHEST - 2 VIEW COMPARISON:  None. FINDINGS: The heart size and mediastinal contours are within normal limits. A right-sided MediPort catheter seen with the tip in mid SVC. Both lungs are clear. The visualized skeletal structures are unremarkable. IMPRESSION: No acute cardiopulmonary process. Electronically Signed   By: Prudencio Pair M.D.   On: 05/12/2019 23:42     Assessment & Plan:  Plan  I am having Leane Platt. [place] start on triamcinolone cream, Hyoscyamine Sulfate SL, traZODone, and azithromycin. I am also having her maintain her ipratropium-albuterol, albuterol, albuterol, Insulin Degludec, glucose blood, pantoprazole, B-D UF III MINI PEN NEEDLES, nitroGLYCERIN, metoprolol tartrate, sucralfate, famotidine, metFORMIN, sertraline, aspirin EC, Centrum Silver Adult 50+, lidocaine-prilocaine, gabapentin, rosuvastatin, isosorbide mononitrate, traMADol, Ozempic (0.25 or 0.5 MG/DOSE), and clopidogrel.  Meds ordered this encounter  Medications  . triamcinolone cream (KENALOG) 0.1 %    Sig: Apply 1 application topically 2 (two) times daily.    Dispense:  30 g    Refill:  0  . Hyoscyamine Sulfate SL (LEVSIN/SL) 0.125 MG SUBL    Sig: 1-2 SL q4h prn diarrhea    Dispense:  120 tablet    Refill:  0  . traZODone (DESYREL) 50 MG tablet    Sig: Take 0.5-1 tablets (25-50 mg total) by mouth at bedtime as needed for sleep.    Dispense:  30 tablet    Refill:  3  . azithromycin (ZITHROMAX Z-PAK) 250 MG tablet    Sig: As directed    Dispense:  6 each    Refill:  0    Problem List Items Addressed This Visit      Unprioritized   Allergic contact dermatitis due to adhesives    Steroid cream prn        Relevant Medications   triamcinolone cream (KENALOG) 0.1 %   Bronchitis    z pak  otc cough  meds  Call prn  ? gerd symptoms--  She will f/u gi for diarrhea/ ibs and discuss gerd as well       Relevant Medications   azithromycin (ZITHROMAX Z-PAK) 250 MG tablet   Chest pain    resolved      Diarrhea - Primary   Relevant Medications   Hyoscyamine Sulfate SL (LEVSIN/SL) 0.125 MG SUBL   Other Relevant Orders   TSH   Hyperlipidemia associated with type 2 diabetes mellitus (HCC) (Chronic)   Relevant Orders   Lipid panel   Comprehensive metabolic panel   Insomnia    Try trial trazadone 1/2-1 qhs       Relevant Medications   traZODone (DESYREL) 50 MG tablet    Other Visit Diagnoses    Need for influenza vaccination       Relevant Orders   Flu Vaccine QUAD High Dose(Fluad) (Completed)      Follow-up: Return in about 6 months (around 11/21/2019), or if symptoms worsen or fail to improve.  Ann Held, DO

## 2019-05-23 NOTE — Patient Instructions (Signed)
Diarrhea, Adult Diarrhea is frequent loose and watery bowel movements. Diarrhea can make you feel weak and cause you to become dehydrated. Dehydration can make you tired and thirsty, cause you to have a dry mouth, and decrease how often you urinate. Diarrhea typically lasts 2-3 days. However, it can last longer if it is a sign of something more serious. It is important to treat your diarrhea as told by your health care provider. Follow these instructions at home: Eating and drinking     Follow these recommendations as told by your health care provider:  Take an oral rehydration solution (ORS). This is an over-the-counter medicine that helps return your body to its normal balance of nutrients and water. It is found at pharmacies and retail stores.  Drink plenty of fluids, such as water, ice chips, diluted fruit juice, and low-calorie sports drinks. You can drink milk also, if desired.  Avoid drinking fluids that contain a lot of sugar or caffeine, such as energy drinks, sports drinks, and soda.  Eat bland, easy-to-digest foods in small amounts as you are able. These foods include bananas, applesauce, rice, lean meats, toast, and crackers.  Avoid alcohol.  Avoid spicy or fatty foods.  Medicines  Take over-the-counter and prescription medicines only as told by your health care provider.  If you were prescribed an antibiotic medicine, take it as told by your health care provider. Do not stop using the antibiotic even if you start to feel better. General instructions   Wash your hands often using soap and water. If soap and water are not available, use a hand sanitizer. Others in the household should wash their hands as well. Hands should be washed: ? After using the toilet or changing a diaper. ? Before preparing, cooking, or serving food. ? While caring for a sick person or while visiting someone in a hospital.  Drink enough fluid to keep your urine pale yellow.  Rest at home while  you recover.  Watch your condition for any changes.  Take a warm bath to relieve any burning or pain from frequent diarrhea episodes.  Keep all follow-up visits as told by your health care provider. This is important. Contact a health care provider if:  You have a fever.  Your diarrhea gets worse.  You have new symptoms.  You cannot keep fluids down.  You feel light-headed or dizzy.  You have a headache.  You have muscle cramps. Get help right away if:  You have chest pain.  You feel extremely weak or you faint.  You have bloody or black stools or stools that look like tar.  You have severe pain, cramping, or bloating in your abdomen.  You have trouble breathing or you are breathing very quickly.  Your heart is beating very quickly.  Your skin feels cold and clammy.  You feel confused.  You have signs of dehydration, such as: ? Dark urine, very little urine, or no urine. ? Cracked lips. ? Dry mouth. ? Sunken eyes. ? Sleepiness. ? Weakness. Summary  Diarrhea is frequent loose and watery bowel movements. Diarrhea can make you feel weak and cause you to become dehydrated.  Drink enough fluids to keep your urine pale yellow.  Make sure that you wash your hands after using the toilet. If soap and water are not available, use hand sanitizer.  Contact a health care provider if your diarrhea gets worse or you have new symptoms.  Get help right away if you have signs of dehydration. This  information is not intended to replace advice given to you by your health care provider. Make sure you discuss any questions you have with your health care provider. Document Released: 07/01/2002 Document Revised: 12/15/2017 Document Reviewed: 12/15/2017 Elsevier Patient Education  2020 [employer].

## 2019-05-23 NOTE — Assessment & Plan Note (Signed)
Steroid cream prn

## 2019-05-23 NOTE — Assessment & Plan Note (Signed)
z pak otc cough meds  Call prn  ? gerd symptoms--  She will f/u gi for diarrhea/ ibs and discuss gerd as well

## 2019-05-23 NOTE — Assessment & Plan Note (Signed)
resolved 

## 2019-05-23 NOTE — Assessment & Plan Note (Signed)
Try trial trazadone 1/2-1 qhs

## 2019-05-24 ENCOUNTER — Other Ambulatory Visit: Payer: Self-pay | Admitting: Family Medicine

## 2019-05-24 ENCOUNTER — Other Ambulatory Visit: Payer: Self-pay | Admitting: Pharmacy Technician

## 2019-05-24 DIAGNOSIS — E875 Hyperkalemia: Secondary | ICD-10-CM

## 2019-05-24 LAB — TSH: TSH: 2.05 u[IU]/mL (ref 0.35–4.50)

## 2019-05-24 NOTE — Patient Outreach (Signed)
[place] [facility]) Care Management  05/24/2019  [name] [birth year] [id number]                                        Medication Assistance Referral  Referral From: [place]  Medication/Company: Tyler Aas and Larna Daughters / [employer] Patient application portion:  Education officer, museum portion: Faxed  to Dr Philemon Kingdom Provider address/fax verified via: Office website    Follow up:  Will follow up with patient in 5-10 business days to confirm application(s) have been received.  Mckenize Mezera P. Yatziry Deakins, [place] Management [phone]

## 2019-05-27 ENCOUNTER — Telehealth: Payer: Self-pay

## 2019-05-27 NOTE — Telephone Encounter (Signed)
Forms for [employer] PAP for [place] and Ozempic filled out, signed by Dr. Cruzita Lederer and faxed to [facility] with confirmation.

## 2019-05-29 DIAGNOSIS — I129 Hypertensive chronic kidney disease with stage 1 through stage 4 chronic kidney disease, or unspecified chronic kidney disease: Secondary | ICD-10-CM | POA: Diagnosis not present

## 2019-05-29 DIAGNOSIS — I2510 Atherosclerotic heart disease of native coronary artery without angina pectoris: Secondary | ICD-10-CM | POA: Diagnosis not present

## 2019-05-29 DIAGNOSIS — E1122 Type 2 diabetes mellitus with diabetic chronic kidney disease: Secondary | ICD-10-CM | POA: Diagnosis not present

## 2019-05-29 DIAGNOSIS — N1831 Chronic kidney disease, stage 3a: Secondary | ICD-10-CM | POA: Diagnosis not present

## 2019-05-30 ENCOUNTER — Other Ambulatory Visit: Payer: Self-pay | Admitting: *Deleted

## 2019-05-30 ENCOUNTER — Other Ambulatory Visit: Payer: Self-pay | Admitting: Adult Health

## 2019-05-30 ENCOUNTER — Other Ambulatory Visit: Payer: Self-pay | Admitting: Family Medicine

## 2019-05-30 DIAGNOSIS — R110 Nausea: Secondary | ICD-10-CM

## 2019-05-30 NOTE — Telephone Encounter (Signed)
Error wrong pool below. Attached correct pool.

## 2019-05-30 NOTE — Telephone Encounter (Signed)
Pt overdue for 1 month f/u.  Please contact pt for future appointment.

## 2019-05-30 NOTE — Telephone Encounter (Signed)
MESSAGE SENT TO SCHEDULING

## 2019-05-31 ENCOUNTER — Other Ambulatory Visit: Payer: Self-pay | Admitting: Pharmacist

## 2019-05-31 NOTE — Patient Outreach (Addendum)
[place] [facility]) Care Management  05/31/2019  [name] [birth year] [id number]   Patient was called to follow up on medication assistance. HIPAA identifiers were obtained. When asked about the patient assistance forms that were mailed to her, she said she was not sure if she received them. She also said she was not sure if she would be interested in continuing the program because when she picked up her Tyler Aas it was $32. She did not pick up the Ozempic.  Patient was encouraged to look for the forms that were mailed to her and to call me or send the forms back if she is interested in continuing. Of importance, Novo Nordisk stops accepting applications for the current year 06/24/2019.  Plan: Close patient's case Alert [facility] Care Team Members still involved with the patient's care. I will gladly reopen the patient's case upon request or return of requested materials.  Elayne Guerin, PharmD, [place] Clinical Pharmacist [phone]

## 2019-06-03 ENCOUNTER — Other Ambulatory Visit: Payer: Medicare HMO

## 2019-06-03 ENCOUNTER — Ambulatory Visit: Payer: Medicare HMO | Admitting: Hematology & Oncology

## 2019-06-04 ENCOUNTER — Other Ambulatory Visit: Payer: Self-pay

## 2019-06-04 NOTE — Patient Outreach (Signed)
[place] [facility]) Care Management  06/04/2019  BRYSSA TONES Apr 09, 1951 [id number]   Follow up call to patient to ensure receipt of senior housing options mailed to her on 05/21/19.   Patient did confirm receipt but stated she has not yet reviewed resources.   Closing Social Work case but did encourage her to call if questions arise when she reviews information.  Ronn Melena, BSW Social Worker [phone]

## 2019-06-07 ENCOUNTER — Other Ambulatory Visit: Payer: Self-pay | Admitting: Family Medicine

## 2019-06-07 ENCOUNTER — Telehealth: Payer: Self-pay | Admitting: Family Medicine

## 2019-06-07 ENCOUNTER — Other Ambulatory Visit: Payer: Self-pay | Admitting: *Deleted

## 2019-06-07 DIAGNOSIS — J40 Bronchitis, not specified as acute or chronic: Secondary | ICD-10-CM

## 2019-06-07 MED ORDER — DOXYCYCLINE HYCLATE 100 MG PO TABS: 100.0000 mg | ORAL_TABLET | Freq: Two times a day (BID) | ORAL | 0 refills | Status: DC

## 2019-06-07 NOTE — Telephone Encounter (Signed)
Copied from [place] [phone]. Topic: General - Other >> Jun 07, 2019  8:40 AM Keene Breath wrote: Reason for CRM: Patient called to inform the doctor that she has finished the antibiotic, azithromycin (ZITHROMAX Z-PAK) 250 MG tablet, but she still has symptoms.  She would like another antibiotic so that it can be cleared up.  Please advise and call to discuss at [phone] or [phone]

## 2019-06-07 NOTE — Patient Outreach (Signed)
Telephone outreach to follow up on diabetes management.  Pt reports she fell asleep while she was sitting on the toilet, fell off, hit her face on the tub. She could not get up and she did not have her phone or her emergency alert. She was able to crawl to the LR and get a blanket and pillow and slept on the floor all night.  She reports her glucose level is doing fair most the time <120 in am.  She did received the materials I sent about carb counting and has reviewed them. She feels like she is doing well with keeping her carb count to 45 a meal.  [facility] CM Care Plan Problem One     Most Recent Value  Care Plan Problem One  Frequency of falls.  Role Documenting the Problem One  Care Management [place] for Problem One  Active  [facility]N Long Term Goal   Pt will not fall and sustain an injury warranting ED visit over the next 60 days.  [facility]N Long Term Goal Start Date  05/20/19  Interventions for Problem One Long Term Goal  Pt states she knows she has to wear her emergency alert. Praised for her recognition of it's importance.    [facility] CM Care Plan Problem Two     Most Recent Value  Care Plan Problem Two  Diabetes not at goal (HgbA1C 10.0)  Role Documenting the Problem Two  Care Management [place] for Problem Two  Active  THN Long Term Goal  Pt hgb A1C will be less then 10.0 on next check within the next 60 days.  THN Long Term Goal Start Date  05/20/19  [facility] CM Short Term Goal #1   Pt will read and discuss All About Carbs with nurse during this month until the end of November.  THN CM Short Term Goal #1 Start Date  05/20/19       I will call her again next week.    Jessica Pont. Myrtie Neither, MSN, [facility] Gerontological Nurse Practitioner [facility] Care Management [phone]

## 2019-06-07 NOTE — Telephone Encounter (Signed)
Doxy sent

## 2019-06-07 NOTE — Telephone Encounter (Signed)
Please advise 

## 2019-06-10 NOTE — Telephone Encounter (Signed)
Patient notified and she did pick up medication.

## 2019-06-12 NOTE — Telephone Encounter (Signed)
Pt called and stated that the doxycycline (VIBRA-TABS) 100 MG tablet Is making her sick to her stomach and causing diarrhea/ Pt would like to know if there is another med or option that can be sent in/ please advise

## 2019-06-13 MED ORDER — LEVOFLOXACIN 500 MG PO TABS: 500.0000 mg | ORAL_TABLET | Freq: Every day | ORAL | 0 refills | Status: DC

## 2019-06-13 NOTE — Telephone Encounter (Signed)
levaquin 500 mg 1 po qd x 7 days

## 2019-06-13 NOTE — Telephone Encounter (Signed)
Patient notified of change and new rx sent in.

## 2019-06-14 ENCOUNTER — Other Ambulatory Visit: Payer: Self-pay

## 2019-06-14 ENCOUNTER — Inpatient Hospital Stay: Payer: Medicare HMO | Attending: Hematology & Oncology

## 2019-06-14 ENCOUNTER — Inpatient Hospital Stay: Payer: Medicare HMO

## 2019-06-14 ENCOUNTER — Inpatient Hospital Stay (HOSPITAL_BASED_OUTPATIENT_CLINIC_OR_DEPARTMENT_OTHER): Payer: Medicare HMO | Admitting: Hematology & Oncology

## 2019-06-14 ENCOUNTER — Other Ambulatory Visit: Payer: Self-pay | Admitting: *Deleted

## 2019-06-14 VITALS — BP 105/72 | HR 81 | Temp 97.3°F | Resp 17 | Wt 206.0 lb

## 2019-06-14 DIAGNOSIS — Z880 Allergy status to penicillin: Secondary | ICD-10-CM | POA: Diagnosis not present

## 2019-06-14 DIAGNOSIS — R531 Weakness: Secondary | ICD-10-CM | POA: Diagnosis not present

## 2019-06-14 DIAGNOSIS — D509 Iron deficiency anemia, unspecified: Secondary | ICD-10-CM | POA: Insufficient documentation

## 2019-06-14 DIAGNOSIS — K760 Fatty (change of) liver, not elsewhere classified: Secondary | ICD-10-CM | POA: Diagnosis not present

## 2019-06-14 DIAGNOSIS — Z888 Allergy status to other drugs, medicaments and biological substances status: Secondary | ICD-10-CM | POA: Diagnosis not present

## 2019-06-14 DIAGNOSIS — E1121 Type 2 diabetes mellitus with diabetic nephropathy: Secondary | ICD-10-CM

## 2019-06-14 DIAGNOSIS — E119 Type 2 diabetes mellitus without complications: Secondary | ICD-10-CM | POA: Insufficient documentation

## 2019-06-14 DIAGNOSIS — R110 Nausea: Secondary | ICD-10-CM | POA: Diagnosis not present

## 2019-06-14 DIAGNOSIS — C161 Malignant neoplasm of fundus of stomach: Secondary | ICD-10-CM

## 2019-06-14 DIAGNOSIS — Z79899 Other long term (current) drug therapy: Secondary | ICD-10-CM | POA: Insufficient documentation

## 2019-06-14 DIAGNOSIS — C160 Malignant neoplasm of cardia: Secondary | ICD-10-CM

## 2019-06-14 DIAGNOSIS — Z8673 Personal history of transient ischemic attack (TIA), and cerebral infarction without residual deficits: Secondary | ICD-10-CM

## 2019-06-14 DIAGNOSIS — Z95828 Presence of other vascular implants and grafts: Secondary | ICD-10-CM

## 2019-06-14 DIAGNOSIS — C169 Malignant neoplasm of stomach, unspecified: Secondary | ICD-10-CM | POA: Diagnosis not present

## 2019-06-14 DIAGNOSIS — R0602 Shortness of breath: Secondary | ICD-10-CM | POA: Diagnosis not present

## 2019-06-14 DIAGNOSIS — R5383 Other fatigue: Secondary | ICD-10-CM | POA: Diagnosis not present

## 2019-06-14 DIAGNOSIS — Z885 Allergy status to narcotic agent status: Secondary | ICD-10-CM | POA: Insufficient documentation

## 2019-06-14 LAB — CMP (CANCER CENTER ONLY)
ALT: 55 U/L — ABNORMAL HIGH (ref 0–44)
AST: 70 U/L — ABNORMAL HIGH (ref 15–41)
Albumin: 2.9 g/dL — ABNORMAL LOW (ref 3.5–5.0)
Alkaline Phosphatase: 133 U/L — ABNORMAL HIGH (ref 38–126)
Anion gap: 8 (ref 5–15)
BUN: 13 mg/dL (ref 8–23)
CO2: 26 mmol/L (ref 22–32)
Calcium: 8.7 mg/dL — ABNORMAL LOW (ref 8.9–10.3)
Chloride: 109 mmol/L (ref 98–111)
Creatinine: 1.87 mg/dL — ABNORMAL HIGH (ref 0.44–1.00)
GFR, Est AFR Am: 32 mL/min — ABNORMAL LOW (ref >60)
GFR, Estimated: 27 mL/min — ABNORMAL LOW (ref >60)
Glucose, Bld: 138 mg/dL — ABNORMAL HIGH (ref 70–99)
Potassium: 4.7 mmol/L (ref 3.5–5.1)
Sodium: 143 mmol/L (ref 135–145)
Total Bilirubin: 0.4 mg/dL (ref 0.3–1.2)
Total Protein: 6.4 g/dL — ABNORMAL LOW (ref 6.5–8.1)

## 2019-06-14 LAB — CBC WITH DIFFERENTIAL (CANCER CENTER ONLY)
Abs Immature Granulocytes: 0.01 10*3/uL (ref 0.00–0.07)
Basophils Absolute: 0 10*3/uL (ref 0.0–0.1)
Basophils Relative: 1 %
Eosinophils Absolute: 0.4 10*3/uL (ref 0.0–0.5)
Eosinophils Relative: 6 %
HCT: 36.2 % (ref 36.0–46.0)
Hemoglobin: 12 g/dL (ref 12.0–15.0)
Immature Granulocytes: 0 %
Lymphocytes Relative: 42 %
Lymphs Abs: 3 10*3/uL (ref 0.7–4.0)
MCH: 28.9 pg (ref 26.0–34.0)
MCHC: 33.1 g/dL (ref 30.0–36.0)
MCV: 87.2 fL (ref 80.0–100.0)
Monocytes Absolute: 0.5 10*3/uL (ref 0.1–1.0)
Monocytes Relative: 8 %
Neutro Abs: 3.1 10*3/uL (ref 1.7–7.7)
Neutrophils Relative %: 43 %
Platelet Count: 307 10*3/uL (ref 150–400)
RBC: 4.15 MIL/uL (ref 3.87–5.11)
RDW: 16.9 % — ABNORMAL HIGH (ref 11.5–15.5)
WBC Count: 7.1 10*3/uL (ref 4.0–10.5)
nRBC: 0 % (ref 0.0–0.2)

## 2019-06-14 MED ORDER — SODIUM CHLORIDE 0.9% FLUSH
10.0000 mL | INTRAVENOUS | Status: DC | PRN
  Administered 2019-06-14: 10 mL via INTRAVENOUS
  Filled 2019-06-14: qty 10

## 2019-06-14 MED ORDER — HEPARIN SOD (PORK) LOCK FLUSH 100 UNIT/ML IV SOLN
500.0000 [IU] | Freq: Once | INTRAVENOUS | Status: AC
  Administered 2019-06-14: 500 [IU] via INTRAVENOUS
  Filled 2019-06-14: qty 5

## 2019-06-14 NOTE — Patient Outreach (Signed)
Telephone outreach to follow up on falls and diabetes management.  [name] reports she has not had any falls in the last 2 weeks.  She has received the ALL ABOUT CARBS educational material and has looked it over briefly.  She reports her FBS ranges from 100-138 and her NFBS 150-200.  I gave her a new assignment to write down the foods she eats in a couple of days and we will work through how to count those carbs. Reassured she can learn this and it will benefit her greatly!  Eulah Pont. Myrtie Neither, MSN, [facility] Gerontological Nurse Practitioner [facility] Care Management [phone]

## 2019-06-14 NOTE — Progress Notes (Signed)
Hematology and Oncology Follow Up Visit  [name] [id number] 17-Apr-1951 68 y.o. 06/14/2019   Principle Diagnosis:  Stage IB (T1, N1, M0) adenocarcinoma of the stomach Iron deficiency anemia  Current Therapy:   Observation IV iron as indicated - last received in December 2018   Interim History: [name] is here today for follow-up.  As always, she has remarried of complaints.  Thankfully, nothing has to do with the fact that she had stomach cancer.  She is tired of taking all of her medications.  She wants to stop taking a lot of her medications.  I told her that she really needs to talk to her other doctors about doing this.  She is just fed up with having to take over 20 pills a day.  She also complains of a lot of reflux.  I know that she sees Dr. Michail Sermon of gastroenterology.  We will see if he might be able to help her out.  I thought that there was some kind of procedure that can be done that is not surgical to try to help.  Again, there is absolute no evidence of recurrent stomach cancer.  She does have diabetes.  She is says her blood sugars have been too low.  Again, her family doctor is managing this.  When we last saw her, her iron studies showed a ferritin of 263 with an iron saturation of 38%.  Her liver studies are elevated.  I have to believe that this is to be hepatic steatosis from her diabetes.  Her last hemoglobin A1c was 10.  She had a CT scan of the abdomen done back in August.  Liver looked okay on that CT scan.  Overall, her performance status is ECOG 1-2.    Medications:  Allergies as of 06/14/2019      Reactions   Adhesive [tape] Other (See Comments)   Burn Skin   Codeine Other (See Comments)   paranoid   Zocor [simvastatin - High Dose] Other (See Comments)   Muscle spasms   Penicillins Rash   Has patient had a PCN reaction causing immediate rash, facial/tongue/throat swelling, SOB or lightheadedness with hypotension: Yes Has patient had a PCN  reaction causing severe rash involving mucus membranes or skin necrosis: No Has patient had a PCN reaction that required hospitalization does not remember.  Has patient had a PCN reaction occurring within the last 10 years: No If all of the above answers are "NO", then may proceed with Cephalosporin use.      Medication List       Accurate as of June 14, 2019 12:25 PM. If you have any questions, ask your nurse or doctor.        albuterol (2.5 MG/3ML) 0.083% nebulizer solution Commonly known as: PROVENTIL Take 3 mLs (2.5 mg total) by nebulization every 6 (six) hours as needed for wheezing or shortness of breath.   albuterol 108 (90 Base) MCG/ACT inhaler Commonly known as: VENTOLIN HFA Inhale 2 puffs into the lungs every 6 (six) hours as needed for wheezing or shortness of breath.   aspirin EC 81 MG tablet Take 1 tablet (81 mg total) by mouth daily.   azithromycin 250 MG tablet Commonly known as: Zithromax Z-Pak As directed   B-D UF III MINI PEN NEEDLES 31G X 5 MM Misc Generic drug: Insulin Pen Needle Use to inject insulin once a day.   Centrum Silver Adult 50+ Tabs Take 1 tablet once a day   clopidogrel 75 MG  tablet Commonly known as: PLAVIX Take 1 tablet (75 mg total) by mouth daily.   famotidine 40 MG tablet Commonly known as: Pepcid Take 1 tablet (40 mg total) by mouth daily.   gabapentin 800 MG tablet Commonly known as: NEURONTIN TAKE ONE TABLET BY MOUTH THREE TIMES DAILY ;;   glucose blood test strip Commonly known as: OneTouch Verio Use as instructed to check blood sugar 3 times a day   Hyoscyamine Sulfate SL 0.125 MG Subl Commonly known as: Levsin/SL 1-2 SL q4h prn diarrhea   Insulin Degludec 200 UNIT/ML Sopn Commonly known as: [place] FlexTouch Inject 20 Units into the skin daily.   ipratropium-albuterol 0.5-2.5 (3) MG/3ML Soln Commonly known as: DUONEB Take 3 mLs by nebulization every 6 (six) hours as needed. What changed: reasons to take this    isosorbide mononitrate 30 MG 24 hr tablet Commonly known as: IMDUR Take 0.5 tablets (15 mg total) by mouth daily. *NEEDS OFFICE VISIT*   levofloxacin 500 MG tablet Commonly known as: Levaquin Take 1 tablet (500 mg total) by mouth daily for 7 days.   lidocaine-prilocaine cream Commonly known as: EMLA Apply 1 application topically as needed. What changed: reasons to take this   metFORMIN 500 MG tablet Commonly known as: GLUCOPHAGE Take 1 tablet (500 mg total) by mouth 2 (two) times daily with a meal.   metoprolol tartrate 25 MG tablet Commonly known as: LOPRESSOR Take 1 tablet (25 mg total) by mouth 2 (two) times daily.   nitroGLYCERIN 0.4 MG SL tablet Commonly known as: Nitrostat Place 1 tablet (0.4 mg total) under the tongue every 5 (five) minutes as needed.   Ozempic (0.25 or 0.5 MG/DOSE) 2 MG/1.5ML Sopn Generic drug: Semaglutide(0.25 or 0.5MG/DOS) Inject 0.5 mg into the skin once a week.   pantoprazole 40 MG tablet Commonly known as: PROTONIX TAKE ONE TABLET BY MOUTH EVERY DAY   rosuvastatin 40 MG tablet Commonly known as: CRESTOR TAKE ONE TABLET BY MOUTH EVERY DAY AT BEDTIME   sertraline 50 MG tablet Commonly known as: ZOLOFT Take 1 tablet (50 mg total) by mouth daily.   sucralfate 1 g tablet Commonly known as: CARAFATE TAKE 1 TABLET BY MOUTH TWICE DAILY   traMADol 50 MG tablet Commonly known as: ULTRAM TAKE TWO TABLETS BY MOUTH THREE TIMES DAILY AS NEEDED FOR MODERATE PAIN What changed:   how much to take  how to take this  when to take this  reasons to take this  additional instructions   traZODone 50 MG tablet Commonly known as: DESYREL Take 0.5-1 tablets (25-50 mg total) by mouth at bedtime as needed for sleep.   triamcinolone cream 0.1 % Commonly known as: KENALOG Apply 1 application topically 2 (two) times daily.       Allergies:  Allergies  Allergen Reactions  . Adhesive [Tape] Other (See Comments)    Burn Skin  . Codeine Other  (See Comments)    paranoid  . Zocor [Simvastatin - High Dose] Other (See Comments)    Muscle spasms  . Penicillins Rash    Has patient had a PCN reaction causing immediate rash, facial/tongue/throat swelling, SOB or lightheadedness with hypotension: Yes Has patient had a PCN reaction causing severe rash involving mucus membranes or skin necrosis: No Has patient had a PCN reaction that required hospitalization does not remember.  Has patient had a PCN reaction occurring within the last 10 years: No If all of the above answers are "NO", then may proceed with Cephalosporin use.  Past Medical History, Surgical history, Social history, and Family History were reviewed and updated.  Review of Systems: Review of Systems  Constitutional: Positive for malaise/fatigue.  HENT: Negative.   Eyes: Negative.   Respiratory: Positive for shortness of breath.   Cardiovascular: Negative.   Gastrointestinal: Positive for nausea.  Genitourinary: Negative.   Musculoskeletal: Negative.   Skin: Negative.   Neurological: Positive for focal weakness.  Endo/Heme/Allergies: Negative.      Physical Exam:  weight is 206 lb (93.4 kg). Her temporal temperature is 97.3 F (36.3 C) (abnormal). Her blood pressure is 105/72 and her pulse is 81. Her respiration is 17 and oxygen saturation is 100%.   Wt Readings from Last 3 Encounters:  06/14/19 206 lb (93.4 kg)  05/23/19 208 lb 6.4 oz (94.5 kg)  05/13/19 215 lb 6.2 oz (97.7 kg)    Physical Exam Vitals signs reviewed.  HENT:     Head: Normocephalic and atraumatic.  Eyes:     Pupils: Pupils are equal, round, and reactive to light.  Neck:     Musculoskeletal: Normal range of motion.  Cardiovascular:     Rate and Rhythm: Normal rate and regular rhythm.     Heart sounds: Normal heart sounds.  Pulmonary:     Effort: Pulmonary effort is normal.     Breath sounds: Normal breath sounds.  Abdominal:     General: Bowel sounds are normal.     Palpations:  Abdomen is soft.  Musculoskeletal: Normal range of motion.        General: No tenderness or deformity.  Lymphadenopathy:     Cervical: No cervical adenopathy.  Skin:    General: Skin is warm and dry.     Findings: No erythema or rash.  Neurological:     Mental Status: She is alert and oriented to person, place, and time.  Psychiatric:        Behavior: Behavior normal.        Thought Content: Thought content normal.        Judgment: Judgment normal.      Lab Results  Component Value Date   WBC 7.1 06/14/2019   HGB 12.0 06/14/2019   HCT 36.2 06/14/2019   MCV 87.2 06/14/2019   PLT 307 06/14/2019   Lab Results  Component Value Date   FERRITIN 263 04/03/2019   IRON 65 04/03/2019   TIBC 170 (L) 04/03/2019   UIBC 105 (L) 04/03/2019   IRONPCTSAT 38 04/03/2019   Lab Results  Component Value Date   RETICCTPCT 1.5 04/28/2014   RBC 4.15 06/14/2019   RETICCTABS 59.9 04/28/2014   No results found for: KPAFRELGTCHN, LAMBDASER, KAPLAMBRATIO No results found for: IGGSERUM, IGA, IGMSERUM No results found for: Odetta Pink, SPEI   Chemistry      Component Value Date/Time   NA 143 06/14/2019 1139   NA 140 02/15/2019 1440   NA 138 07/06/2017 0745   K 4.7 06/14/2019 1139   K 4.4 07/06/2017 0745   CL 109 06/14/2019 1139   CL 101 08/02/2016 1351   CO2 26 06/14/2019 1139   CO2 22 07/06/2017 0745   BUN 13 06/14/2019 1139   BUN 19 02/15/2019 1440   BUN 19.7 07/06/2017 0745   CREATININE 1.87 (H) 06/14/2019 1139   CREATININE 1.3 (H) 07/06/2017 0745      Component Value Date/Time   CALCIUM 8.7 (L) 06/14/2019 1139   CALCIUM 9.3 07/06/2017 0745   ALKPHOS 133 (H) 06/14/2019 1139  ALKPHOS 115 07/06/2017 0745   AST 70 (H) 06/14/2019 1139   AST 18 07/06/2017 0745   ALT 55 (H) 06/14/2019 1139   ALT 14 07/06/2017 0745   BILITOT 0.4 06/14/2019 1139   BILITOT 0.30 07/06/2017 0745       Impression and Plan: Ms. Prezioso is a very  pleasant 68 yo African American female with history of stage IB stomach cancer. She had her tumor resected and completed adjuvant radiation and chemo in 2010.   I really doubt that cancer will be a problem for her.  I think her diabetes clearly be what we will limit her life expectancy.  We will see what her iron studies show.   Her liver test blood to be monitored.  I will see her back in about 3 months.  She still has her Port-A-Cath and so we will have to make sure this is flushed.    Volanda Napoleon, MD 11/20/202012:25 PM

## 2019-06-16 NOTE — Progress Notes (Signed)
Patient rescheduled

## 2019-06-17 ENCOUNTER — Encounter: Payer: Medicare HMO | Admitting: Adult Health

## 2019-06-17 ENCOUNTER — Ambulatory Visit: Payer: Medicare HMO | Admitting: Cardiology

## 2019-06-17 LAB — IRON AND TIBC
Iron: 79 ug/dL (ref 41–142)
Saturation Ratios: 58 % — ABNORMAL HIGH (ref 21–57)
TIBC: 135 ug/dL — ABNORMAL LOW (ref 236–444)
UIBC: 56 ug/dL — ABNORMAL LOW (ref 120–384)

## 2019-06-17 LAB — FERRITIN: Ferritin: 253 ng/mL (ref 11–307)

## 2019-06-17 NOTE — Progress Notes (Signed)
Virtual Visit via Telephone Note   This visit type was conducted due to national recommendations for restrictions regarding the COVID-19 Pandemic (e.g. social distancing) in an effort to limit this patient's exposure and mitigate transmission in our community.  Due to her co-morbid illnesses, this patient is at least at moderate risk for complications without adequate follow up.  This format is felt to be most appropriate for this patient at this time.  The patient did not have access to video technology/had technical difficulties with video requiring transitioning to audio format only (telephone).  All issues noted in this document were discussed and addressed.  No physical exam could be performed with this format.  Please refer to the patient's chart for her  consent to telehealth for [facility].   Date:  06/18/2019   ID:  Jessica, Jefferson March 29, 1951, MRN [id number]  Patient Location: Home Provider Location: Home  PCP:  Ann Held, DO  Cardiologist:  Glenetta Hew, MD  Electrophysiologist:  None   Evaluation Performed:  Follow-Up Visit  Chief Complaint:  Follow Up  History of Present Illness:    [name] is a 68 y.o. female we are following for ongoing assessment and management of coronary artery disease, and was seen on consultation on 05/12/2019 by Dr. Daneen Schick III for complaints of chest pain.She was admitted with gastritis with diarrhea. Negative for C-Diff.   [name] has a history of PCI with drug-eluting stent to the proximal and mid RCA, circumflex to OM, on 08/29/2018, hypertension, hyperlipidemia, with other history to include diabetes mellitus, TIA, asthma, depression, sickle cell trait, malignant neoplasm of the stomach, status post chemoradiation, GERD, and chronic kidney disease stage III.  Dr. Tamala Julian reviewed her labs and EKGs.  Her EKG was unremarkable, troponin 34 with a delta greater than 20.  She was asymptomatic when being seen on consult.  She  was diagnosed with an NSTEMI. if symptoms worsen she would be planned for cardiac catheterization.  Unfortunately, the patient's troponin increased to 374, she was pain-free, but then began to be febrile (T-max 102.8).    Cardiac catheterization would be placed on hold until she was fever free for at least 24 hours to err on the side of caution.  As she was chest pain-free on follow-up rounding assessments, cardiac catheterization was not completed and she was to continue medical management. She was discharged on 05/16/2019 and was asymptomatic.   She is doing well, but feels she is on too much medication and would like to eliminate some.   She denies chest pain, DOE, or significant fatigue. She has seem her PCP on follow up for labs and refills. She continues to have weight loss, down from 300 lbs, to 213 lbs. She is not eating as much and watching her diet.   The patient does not have symptoms concerning for COVID-19 infection (fever, chills, cough, or new shortness of breath). She states she has been tested 3 times, and all were negative.    Past Medical History:  Diagnosis Date  . Asthma   . Degenerative joint disease   . Diabetes mellitus   . Dyspnea   . GERD (gastroesophageal reflux disease)   . Hyperlipidemia    no longer on medication for this  . Hypertension    patient denies  . Myocardial infarction ([place]) 08/2018  . Neuropathy    bilateral hands and feet  . Sickle cell trait ([place])   . Sleep apnea   .  stomach ca dx'd 2010   chemo/xrt comp 05/2009  . TIA (transient ischemic attack) 07/2015   Past Surgical History:  Procedure Laterality Date  . APPENDECTOMY    . CESAREAN SECTION    . COLON SURGERY     colonscopy  . COLONOSCOPY WITH PROPOFOL N/A 06/01/2016   Procedure: COLONOSCOPY WITH PROPOFOL;  Surgeon: Wilford Corner, MD;  Location[facility] ENDOSCOPY;  Service: Endoscopy;  Laterality: N/A;  . CORONARY STENT INTERVENTION N/A 08/29/2018   Procedure: CORONARY STENT INTERVENTION;   Surgeon: Wellington Hampshire, MD;  Location: [place] CV LAB;  Service: Cardiovascular;  Laterality: N/A;  . CORONARY/GRAFT ACUTE MI REVASCULARIZATION N/A 08/27/2018   Procedure: Coronary/Graft Acute MI Revascularization;  Surgeon: Leonie Man, MD;  Location: [place] CV LAB;  Service: Cardiovascular;  Laterality: N/A;  . ESOPHAGOGASTRODUODENOSCOPY N/A 10/15/2012   Procedure: ESOPHAGOGASTRODUODENOSCOPY (EGD);  Surgeon: Lear Ng, MD;  Location: [facility] ENDOSCOPY;  Service: Endoscopy;  Laterality: N/A;  . ESOPHAGOGASTRODUODENOSCOPY N/A 01/15/2016   Procedure: ESOPHAGOGASTRODUODENOSCOPY (EGD);  Surgeon: Ronald Lobo, MD;  Location: [facility] ENDOSCOPY;  Service: Endoscopy;  Laterality: N/A;  . HERNIA REPAIR    . LEFT HEART CATH AND CORONARY ANGIOGRAPHY N/A 08/27/2018   Procedure: LEFT HEART CATH AND CORONARY ANGIOGRAPHY;  Surgeon: Leonie Man, MD;  Location: [place] CV LAB;  Service: Cardiovascular;  Laterality: N/A;  . SHOULDER SURGERY     Left  . TOTAL HIP ARTHROPLASTY     Right  . TOTAL KNEE ARTHROPLASTY     Left     Current Meds  Medication Sig  . albuterol (PROVENTIL HFA;VENTOLIN HFA) 108 (90 Base) MCG/ACT inhaler Inhale 2 puffs into the lungs every 6 (six) hours as needed for wheezing or shortness of breath.  Marland Kitchen albuterol (PROVENTIL) (2.5 MG/3ML) 0.083% nebulizer solution Take 3 mLs (2.5 mg total) by nebulization every 6 (six) hours as needed for wheezing or shortness of breath.  Marland Kitchen aspirin EC 81 MG tablet Take 1 tablet (81 mg total) by mouth daily.  . clopidogrel (PLAVIX) 75 MG tablet Take 1 tablet (75 mg total) by mouth daily.  . famotidine (PEPCID) 40 MG tablet Take 1 tablet (40 mg total) by mouth daily.  Marland Kitchen gabapentin (NEURONTIN) 800 MG tablet TAKE ONE TABLET BY MOUTH THREE TIMES DAILY ;; (Patient taking differently: Take 800 mg by mouth 3 (three) times daily. )  . glucose blood (ONETOUCH VERIO) test strip Use as instructed to check blood sugar 3 times a day  .  Hyoscyamine Sulfate SL (LEVSIN/SL) 0.125 MG SUBL 1-2 SL q4h prn diarrhea  . Insulin Degludec (TRESIBA FLEXTOUCH) 200 UNIT/ML SOPN Inject 20 Units into the skin daily.  . Insulin Pen Needle (B-D UF III MINI PEN NEEDLES) 31G X 5 MM MISC Use to inject insulin once a day.  . ipratropium-albuterol (DUONEB) 0.5-2.5 (3) MG/3ML SOLN Take 3 mLs by nebulization every 6 (six) hours as needed. (Patient taking differently: Take 3 mLs by nebulization every 6 (six) hours as needed (sob and wheezing). )  . isosorbide mononitrate (IMDUR) 30 MG 24 hr tablet Take 0.5 tablets (15 mg total) by mouth daily. *NEEDS OFFICE VISIT*  . lidocaine-prilocaine (EMLA) cream Apply 1 application topically as needed. (Patient taking differently: Apply 1 application topically as needed (to numb port site.). )  . metFORMIN (GLUCOPHAGE) 500 MG tablet Take 1 tablet (500 mg total) by mouth 2 (two) times daily with a meal.  . metoprolol tartrate (LOPRESSOR) 25 MG tablet Take 1 tablet (25 mg total) by mouth  2 (two) times daily.  . Multiple Vitamins-Minerals (CENTRUM SILVER ADULT 50+) TABS Take 1 tablet once a day  . nitroGLYCERIN (NITROSTAT) 0.4 MG SL tablet Place 1 tablet (0.4 mg total) under the tongue every 5 (five) minutes as needed.  . pantoprazole (PROTONIX) 40 MG tablet TAKE ONE TABLET BY MOUTH EVERY DAY  . rosuvastatin (CRESTOR) 40 MG tablet TAKE ONE TABLET BY MOUTH EVERY DAY AT BEDTIME (Patient taking differently: Take 40 mg by mouth at bedtime. )  . Semaglutide,0.25 or 0.5MG/DOS, (OZEMPIC, 0.25 OR 0.5 MG/DOSE,) 2 MG/1.5ML SOPN Inject 0.5 mg into the skin once a week.  . sertraline (ZOLOFT) 50 MG tablet Take 1 tablet (50 mg total) by mouth daily.  . sucralfate (CARAFATE) 1 g tablet TAKE 1 TABLET BY MOUTH TWICE DAILY (Patient taking differently: Take 1 g by mouth 2 (two) times daily. )  . traMADol (ULTRAM) 50 MG tablet TAKE TWO TABLETS BY MOUTH THREE TIMES DAILY AS NEEDED FOR MODERATE PAIN (Patient taking differently: Take 100 mg by  mouth 3 (three) times daily as needed for moderate pain. )  . traZODone (DESYREL) 50 MG tablet Take 0.5-1 tablets (25-50 mg total) by mouth at bedtime as needed for sleep.  Marland Kitchen triamcinolone cream (KENALOG) 0.1 % Apply 1 application topically 2 (two) times daily.  . [DISCONTINUED] azithromycin (ZITHROMAX Z-PAK) 250 MG tablet As directed  . [DISCONTINUED] levofloxacin (LEVAQUIN) 500 MG tablet Take 1 tablet (500 mg total) by mouth daily for 7 days.     Allergies:   Adhesive [tape], Codeine, Zocor [simvastatin - high dose], and Penicillins   Social History   Tobacco Use  . Smoking status: Former Smoker    Packs/day: 1.00    Years: 15.00    Pack years: 15.00    Types: Cigarettes    Start date: 09/27/1973    Quit date: 10/15/1988    Years since quitting: 30.6  . Smokeless tobacco: Never Used  . Tobacco comment: quit smoking 14 years ago  Substance Use Topics  . Alcohol use: No    Alcohol/week: 0.0 standard drinks  . Drug use: No     Family Hx: The patient's family history includes Alzheimer's disease (age of onset: 27) in her father; Cancer in her maternal grandfather; Cancer (age of onset: 12) in her mother; Diabetes in her brother and maternal grandmother; Healthy in her child. There is no history of Suicidality, Depression, Dementia, or Anxiety disorder.  ROS:   Please see the history of present illness.    All other systems reviewed and are negative.   Prior CV studies:   The following studies were reviewed today: [place] 09/11/18 Intervention  Previously placed Prox RCA to Mid RCA drug eluting stent is widely patent.  Balloon angioplasty was performed.  Prox RCA lesion is 20% stenosed.  Prox Cx to Mid Cx lesion is 90% stenosed.  Post intervention, there is a 0% residual stenosis.  A drug-eluting stent was successfully placed using a STENT RESOLUTE ONYX [id number].   Successful angioplasty and drug-eluting stent placement to the mid left circumflex via the right radial artery.   Recommendations: Continue dual antiplatelet therapy for at least 1 year.  [place] 08/27/2018   The left ventricular systolic function is normal.  LV end diastolic pressure is normal.  The left ventricular ejection fraction is greater than 65% by visual estimate.  There is trivial (1+) mitral regurgitation.  There is no aortic valve stenosis.  Prox RCA to Mid RCA lesion is 100% stenosed.  Post intervention,  there is a 0% residual stenosis.  A drug-eluting stent was successfully placed using a [place] [id number].  Prox RCA lesion is 20% stenosed.  Prox Cx lesion is 90% stenosed.  Echocardiogram 08/28/2018 1. The left ventricle has low normal systolic function of 82-70%. The cavity size is normal. There is severely increased left ventricular wall thickness of the septal wall. Normal left ventricular filling pressures. The left ventricular diastology could  not be evaluated due to indeterminent diastolic function. There is severe akinesis of the basiliar-mid inferior left ventricular segment.  2. There is mild dilatation of the ascending aorta.  3. Ao Asc diam:3.75 cm.  4. The tricuspid valve is normal in structure. Regurgitation is mild by color flow Doppler.  5. The mitral valve is normal in structure Regurgitation is trivial by color flow Doppler.  6. Compared to prior echo, there is now mid and basal inferior akinesis. The endocardial segments are not well visualized and Definity contrast would be helpful to delineate if this is a new wall motion abnormality.  Labs/Other Tests and Data Reviewed:    EKG:  No ECG reviewed.  Recent Labs: 03/24/2019: Magnesium 1.6 05/23/2019: TSH 2.05 06/14/2019: ALT 55; BUN 13; Creatinine 1.87; Hemoglobin 12.0; Platelet Count 307; Potassium 4.7; Sodium 143   Recent Lipid Panel Lab Results  Component Value Date/Time   CHOL 57 05/23/2019 10:37 AM   CHOL 196 08/02/2016 01:17 PM   TRIG 67.0 05/23/2019 10:37 AM   TRIG 169 (H) 08/02/2016  01:17 PM   HDL 32.80 (L) 05/23/2019 10:37 AM   HDL 60 08/02/2016 01:17 PM   CHOLHDL 2 05/23/2019 10:37 AM   LDLCALC 11 05/23/2019 10:37 AM   LDLCALC 102 (H) 08/02/2016 01:17 PM   LDLDIRECT 136.0 08/20/2018 11:42 AM    Wt Readings from Last 3 Encounters:  06/18/19 213 lb (96.6 kg)  06/14/19 206 lb (93.4 kg)  05/23/19 208 lb 6.4 oz (94.5 kg)     Objective:    Vital Signs:  Ht 5' 6"  (1.676 m)   Wt 213 lb (96.6 kg)   BMI 34.38 kg/m    VITAL SIGNS:  reviewed GEN:  no acute distress PSYCH:  normal affect  ASSESSMENT & PLAN:    1. CAD: Continue current regimen as she is asymptomatic. She will continue on Plavix for a minimum of one year, (08/2019).  She will discuss with Dr. Ellyn Hack if okay to stop this on follow up appointment.   2. Hypertension;  She remains only on metoprolol for BP control along with low dose isosorbide.  She is not having any dizziness or hypotension on provider visits.   3. Hypercholesterolemia: Continue on rosuvastatin. Labs are completed by PCP  COVID-19 Education: The signs and symptoms of COVID-19 were discussed with the patient and how to seek care for testing (follow up with PCP or arrange E-visit). The importance of social distancing was discussed today. She reports that she is not attending Thanksgiving with her family to stay safe.   Time:   Today, I have spent 15 minutes with the patient with telehealth technology discussing the above problems.     Medication Adjustments/Labs and Tests Ordered: Current medicines are reviewed at length with the patient today.  Concerns regarding medicines are outlined above.   Tests Ordered: No orders of the defined types were placed in this encounter.   Medication Changes: No orders of the defined types were placed in this encounter.   Disposition:  Follow up 6 months with Dr.  Ellyn Hack (in person).   Signed, Phill Myron. West Pugh, ANP, AACC  06/18/2019 10:35 AM    [facility]

## 2019-06-18 ENCOUNTER — Encounter: Payer: Self-pay | Admitting: Adult Health

## 2019-06-18 ENCOUNTER — Telehealth (INDEPENDENT_AMBULATORY_CARE_PROVIDER_SITE_OTHER): Payer: Medicare HMO | Admitting: Adult Health

## 2019-06-18 VITALS — Ht 66.0 in | Wt 213.0 lb

## 2019-06-18 DIAGNOSIS — I2510 Atherosclerotic heart disease of native coronary artery without angina pectoris: Secondary | ICD-10-CM | POA: Diagnosis not present

## 2019-06-18 DIAGNOSIS — E785 Hyperlipidemia, unspecified: Secondary | ICD-10-CM

## 2019-06-18 DIAGNOSIS — I10 Essential (primary) hypertension: Secondary | ICD-10-CM

## 2019-06-18 NOTE — Patient Instructions (Signed)
Medication Instructions:  Continue current medications  *If you need a refill on your cardiac medications before your next appointment, please call your pharmacy*  Lab Work: None Ordered  Testing/Procedures: None Ordered  Follow-Up: At [facility], you and your health needs are our priority.  As part of our continuing mission to provide you with exceptional heart care, we have created designated Provider Care Teams.  These Care Teams include your primary Cardiologist (physician) and Advanced Practice Providers (APPs -  Physician Assistants and Nurse Practitioners) who all work together to provide you with the care you need, when you need it.  Your next appointment:   6 month(s)  The format for your next appointment:   In Person  Provider:   Glenetta Hew, MD  Other Instructions

## 2019-06-25 ENCOUNTER — Other Ambulatory Visit: Payer: Self-pay | Admitting: *Deleted

## 2019-06-25 NOTE — Patient Outreach (Signed)
Telephone outreach unsuccessful this am, however, was able to leave a message and requested a return call.  [name]. Myrtie Neither, MSN, [facility] Gerontological Nurse Practitioner [facility] Care Management [phone]

## 2019-06-29 ENCOUNTER — Other Ambulatory Visit: Payer: Self-pay | Admitting: Hematology & Oncology

## 2019-06-29 ENCOUNTER — Other Ambulatory Visit: Payer: Self-pay | Admitting: Adult Health

## 2019-06-29 ENCOUNTER — Other Ambulatory Visit: Payer: Self-pay | Admitting: Family Medicine

## 2019-07-01 IMAGING — CT CT HEAD WO/W CM
4 of 8 series · 16 of 47 positions shown, 18 images · IV contrast (iopamidol)
Comparison: 02/03/2016 noncontrast head CT

CLINICAL DATA: New daily headache.  History of gastric carcinoma.

EXAM:
CT HEAD WITHOUT AND WITH CONTRAST
TECHNIQUE: Contiguous axial images were obtained from the base of the skull
through the vertex without and with intravenous contrast
CONTRAST:  80mL ZAQZ9T-AXX IOPAMIDOL (ZAQZ9T-AXX) INJECTION 61%

[Series 2: head wo · axial · 0.41mm/px · z∈[-152,-52]mm · 5 of 32 slices shown]
[im 6/32  brain]
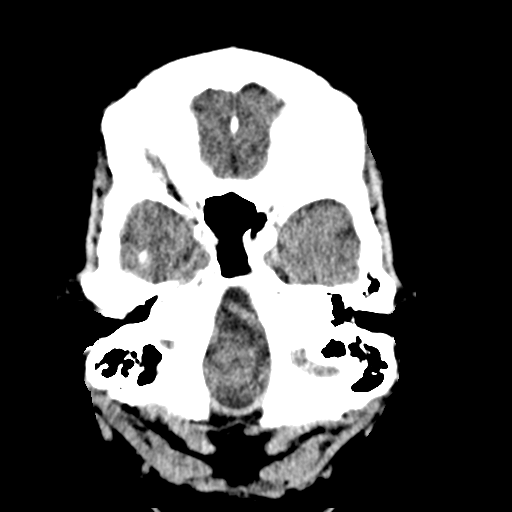
[im 11/32  brain]
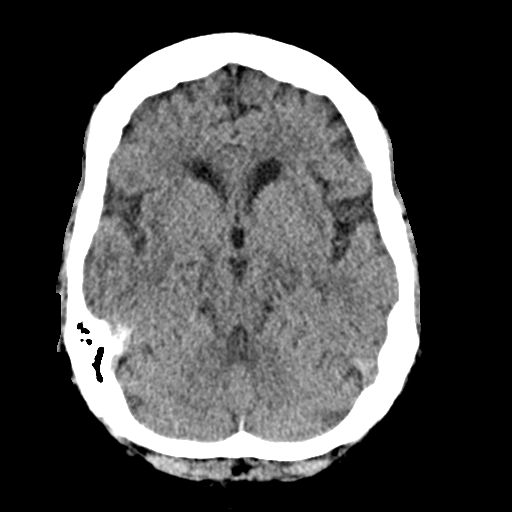
[im 16/32  brain]
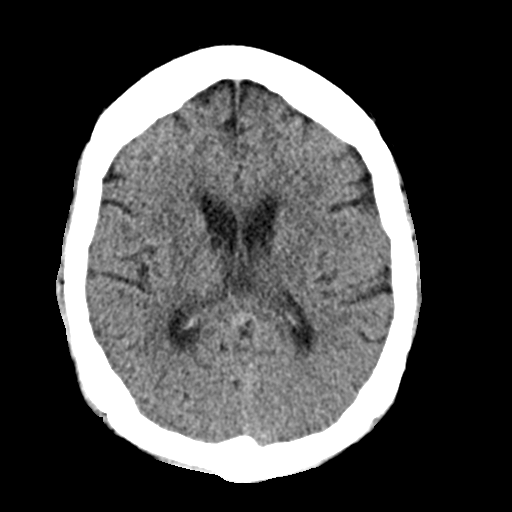
[im 21/32  brain]
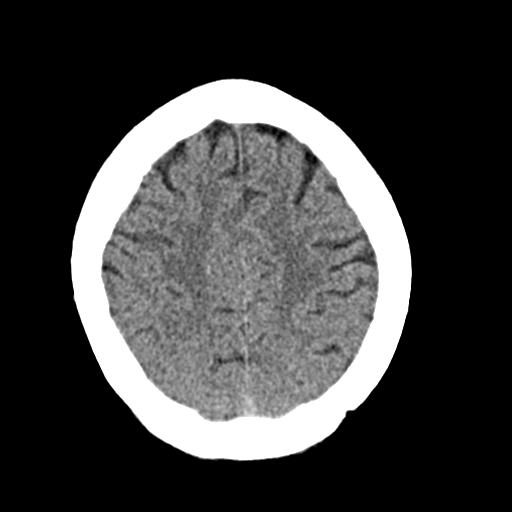
[im 26/32  brain]
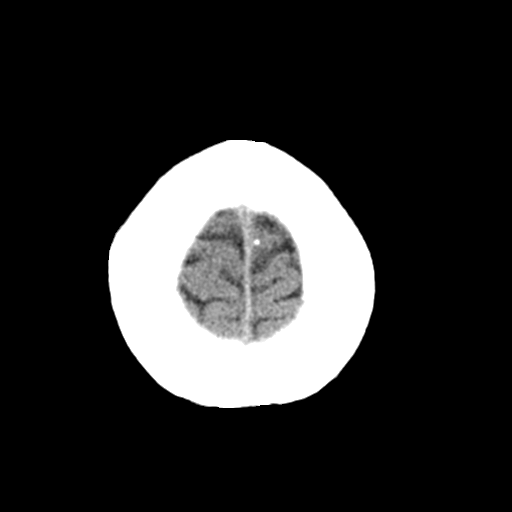

[Series 4: cor wo soft · coronal · 0.30mm/px · 3 of 65 slices shown]
[im 17/65  brain]
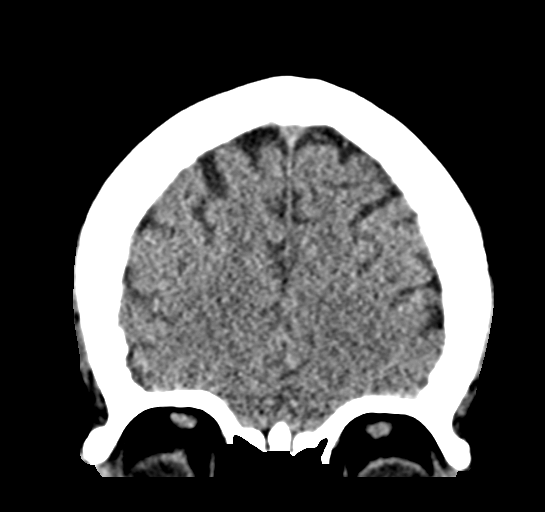
[im 33/65  brain]
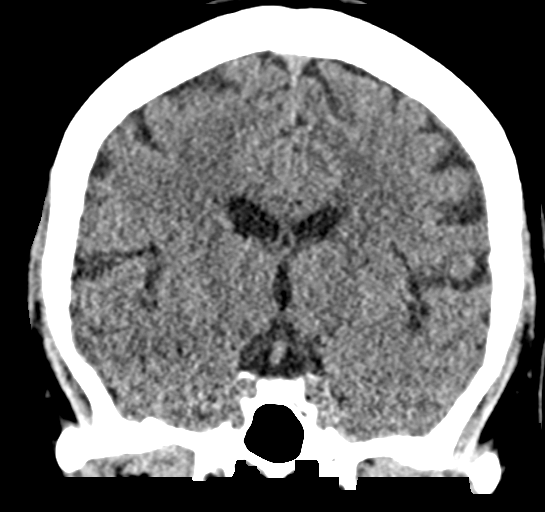
[im 49/65  brain]
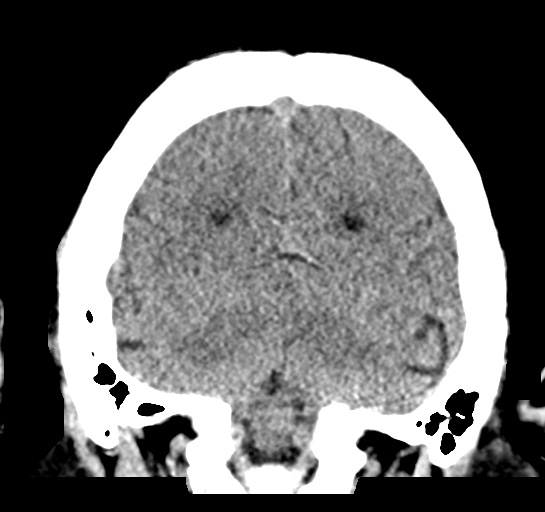

[Series 6: head w soft · axial · 0.41mm/px · z∈[-157,-47]mm · 6 of 32 slices shown, 8 images]
[im 5/32  brain]
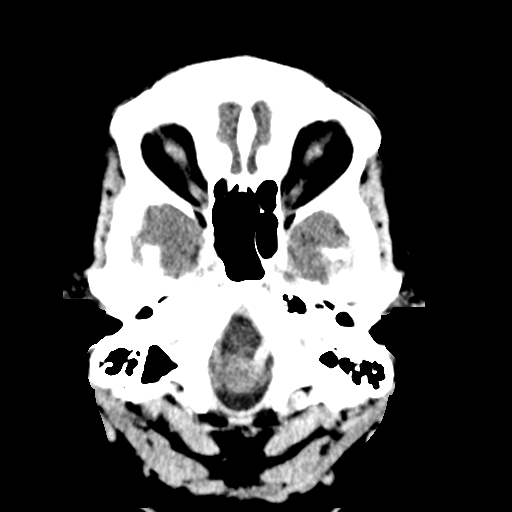
[im 5/32  bone]
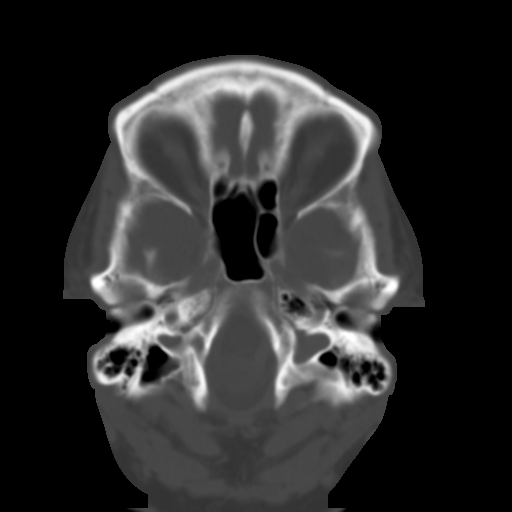
[im 9/32  brain]
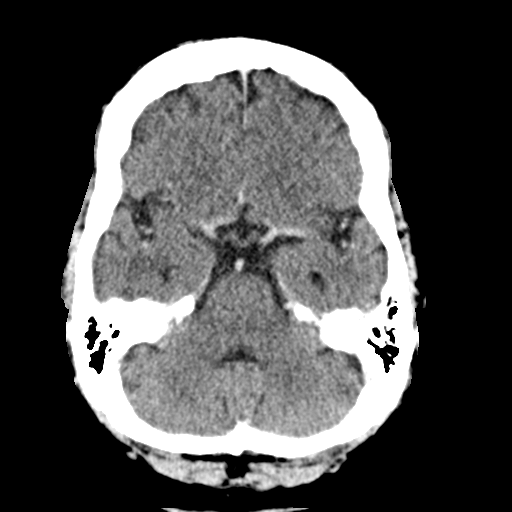
[im 14/32  brain]
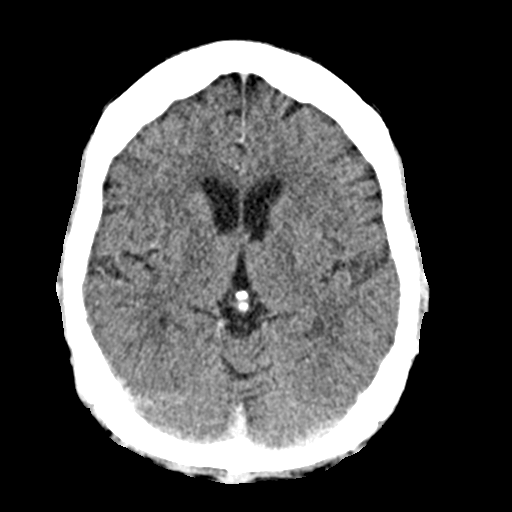
[im 18/32  brain]
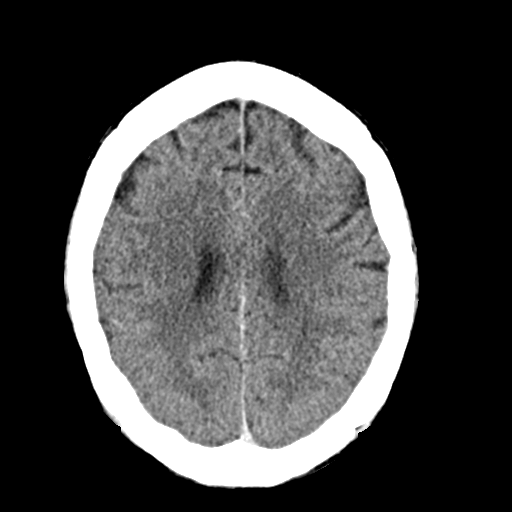
[im 23/32  brain]
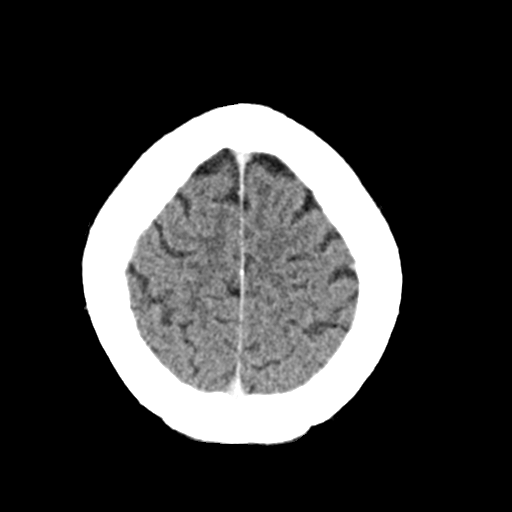
[im 23/32  bone]
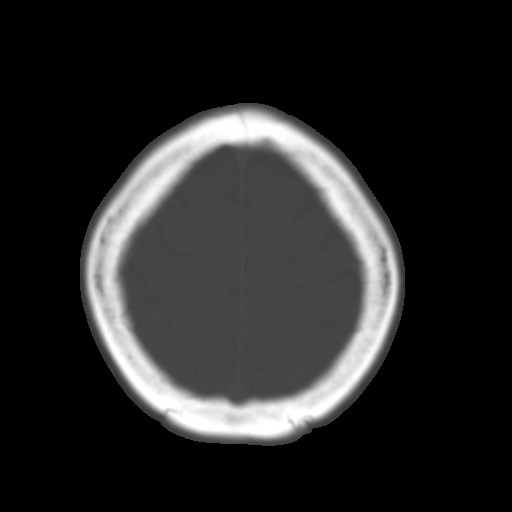
[im 27/32  brain]
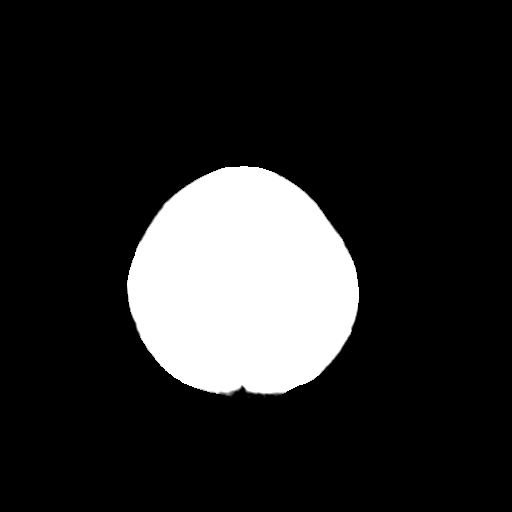

[Series 9: sag soft · sagittal · 0.32mm/px · 2 of 56 slices shown]
[im 19/56  brain]
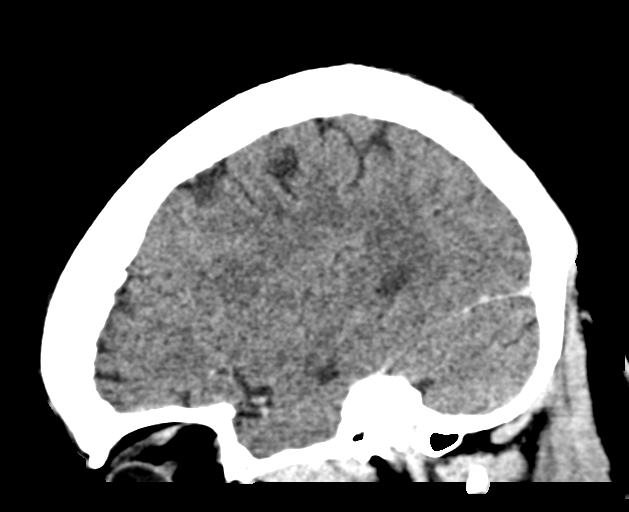
[im 37/56  brain]
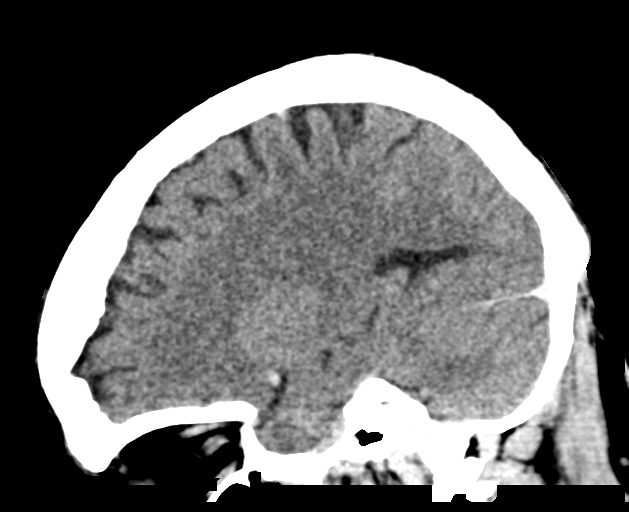

[16 of 47 positions shown; findings below may reference images not displayed]

FINDINGS: Brain: No evidence of mass or edema. No acute infarct, hemorrhage,
hydrocephalus, or collection. Small remote right cerebellar infarct,
in retrospect also present in 0580

Vascular: No hyperdense vessel or unexpected calcification. Visible
vessels are patent.

Skull: No evidence of metastasis

Sinuses/Orbits: Small polyp or retention cyst in the anterior right
sphenoid sinus. No fluid level.
IMPRESSION: No acute finding or evidence of metastatic disease.

## 2019-07-02 ENCOUNTER — Other Ambulatory Visit: Payer: Self-pay | Admitting: Family

## 2019-07-03 ENCOUNTER — Ambulatory Visit (INDEPENDENT_AMBULATORY_CARE_PROVIDER_SITE_OTHER): Payer: Medicare HMO | Admitting: Family Medicine

## 2019-07-03 ENCOUNTER — Encounter: Payer: Self-pay | Admitting: Internal Medicine

## 2019-07-03 ENCOUNTER — Encounter: Payer: Self-pay | Admitting: Family Medicine

## 2019-07-03 ENCOUNTER — Other Ambulatory Visit: Payer: Self-pay

## 2019-07-03 DIAGNOSIS — R682 Dry mouth, unspecified: Secondary | ICD-10-CM

## 2019-07-03 MED ORDER — NYSTATIN 100000 UNIT/ML MT SUSP: OROMUCOSAL | 0 refills | Status: DC

## 2019-07-03 NOTE — Progress Notes (Signed)
.  Virtual Visit via Video Note  I connected with [name] on 07/03/19 at 11:00 AM EST by a video enabled telemedicine application and verified that I am speaking with the correct person using two identifiers.  Location: Patient: home alone  Provider: home    I discussed the limitations of evaluation and management by telemedicine and the availability of in person appointments. The patient expressed understanding and agreed to proceed.  History of Present Illness: Pt is home c/o burning tongue.  She thinks its her medication.  No coating on her tongue.  No changes in her meds    Observations/Objective: No vitals obtained  Pt in NAd  No white coating on tongue--- tongue normal color   Assessment and Plan: 1. Dry mouth Biotin She can try mouthwash ordered Discuss meds with cardiology and endo See dentist  - nystatin (MYCOSTATIN) 100000 UNIT/ML suspension; 5 ml po swish and spit qid  Dispense: 120 mL; Refill: 0   Follow Up Instructions:    I discussed the assessment and treatment plan with the patient. The patient was provided an opportunity to ask questions and all were answered. The patient agreed with the plan and demonstrated an understanding of the instructions.   The patient was advised to call back or seek an in-person evaluation if the symptoms worsen or if the condition fails to improve as anticipated.  I provided 15 minutes of non-face-to-face time during this encounter.   Ann Held, DO

## 2019-07-03 NOTE — Progress Notes (Signed)
Received labs from nephrology, drawn on 05/29/2019:  Glucose 295, BUN/creatinine 17/1.48, GFR 42  It was mentioned that she finally obtained [place] and her sugars improved. Nephrology suggested [place].  Of note, patient could not afford Jardiance in the past.  At next visit, we will try to add Invokana.

## 2019-07-08 ENCOUNTER — Other Ambulatory Visit (HOSPITAL_BASED_OUTPATIENT_CLINIC_OR_DEPARTMENT_OTHER): Payer: Self-pay | Admitting: Family Medicine

## 2019-07-08 DIAGNOSIS — Z1231 Encounter for screening mammogram for malignant neoplasm of breast: Secondary | ICD-10-CM

## 2019-07-15 ENCOUNTER — Telehealth: Payer: Self-pay | Admitting: Family Medicine

## 2019-07-15 DIAGNOSIS — R110 Nausea: Secondary | ICD-10-CM

## 2019-07-15 NOTE — Telephone Encounter (Signed)
Patient is calling to report to Dr. Carollee Herter that she had an attack with her acid reflux on Friday. And it affected her asmtha. However, the patient reports that she is fine now. With no difficulty breathing.  Patient states that she took extra otc medication ( alka Seltzer)to clear the acid reflux. Patient think she needs some stronger medications. Patient denied wanting appt with Dr. Carollee Herter. Please advise [phone] or [phone]

## 2019-07-15 NOTE — Telephone Encounter (Signed)
Can add protonix 40 mg 1 po qd #30  2 refills but remind her gerd can mimic heart attack It should not be ignored and pt should have ov or go to er if it occurs again

## 2019-07-15 NOTE — Telephone Encounter (Signed)
FYI. Please advise.

## 2019-07-16 ENCOUNTER — Encounter: Payer: Self-pay | Admitting: Family Medicine

## 2019-07-16 ENCOUNTER — Ambulatory Visit (INDEPENDENT_AMBULATORY_CARE_PROVIDER_SITE_OTHER): Payer: Medicare HMO | Admitting: Family Medicine

## 2019-07-16 ENCOUNTER — Other Ambulatory Visit: Payer: Self-pay

## 2019-07-16 VITALS — Ht 66.0 in | Wt 210.0 lb

## 2019-07-16 DIAGNOSIS — J40 Bronchitis, not specified as acute or chronic: Secondary | ICD-10-CM | POA: Diagnosis not present

## 2019-07-16 MED ORDER — PANTOPRAZOLE SODIUM 40 MG PO TBEC: 40.0000 mg | DELAYED_RELEASE_TABLET | Freq: Every day | ORAL | 1 refills | Status: AC

## 2019-07-16 MED ORDER — AZITHROMYCIN 250 MG PO TABS: ORAL_TABLET | ORAL | 0 refills | Status: DC

## 2019-07-16 NOTE — Telephone Encounter (Signed)
Appointment made for this afternoon

## 2019-07-16 NOTE — Telephone Encounter (Signed)
She needs virtual visit  If cough and congestion is new since Friday she does need to get tested again

## 2019-07-16 NOTE — Telephone Encounter (Signed)
Rx sent in. Pt states having cough and congestion since Friday. I advised patient that she may need to be tested for Covid. Pt states being tested for COVID four times before. Please advise

## 2019-07-16 NOTE — Progress Notes (Signed)
Virtual Visit via Video Note  I connected with [name] on 07/20/19 at  3:30 PM EST by a video enabled telemedicine application and verified that I am speaking with the correct person using two identifiers.  Location: Patient: home alone Provider: office    I discussed the limitations of evaluation and management by telemedicine and the availability of in person appointments. The patient expressed understanding and agreed to proceed.  History of Present Illness: Pt is home c/o acid reflux Friday and she woke up the next day trying to spit up  Now she has congestion , + productive cough   She has not needed to use her inhalers more that usual   Observations/Objective: No fever No vitals obtained  No otc meds  Assessment and Plan: 1. Bronchitis con't meds Call or f/u prn Pt will call for app for covid test  - azithromycin (ZITHROMAX Z-PAK) 250 MG tablet; As directed  Dispense: 6 each; Refill: 0   Follow Up Instructions:    I discussed the assessment and treatment plan with the patient. The patient was provided an opportunity to ask questions and all were answered. The patient agreed with the plan and demonstrated an understanding of the instructions.   The patient was advised to call back or seek an in-person evaluation if the symptoms worsen or if the condition fails to improve as anticipated.  I provided 15 minutes of non-face-to-face time during this encounter.   Ann Held, DO

## 2019-07-24 ENCOUNTER — Other Ambulatory Visit: Payer: Self-pay

## 2019-07-24 ENCOUNTER — Ambulatory Visit (INDEPENDENT_AMBULATORY_CARE_PROVIDER_SITE_OTHER): Payer: Medicare HMO | Admitting: Medical

## 2019-07-24 ENCOUNTER — Encounter: Payer: Self-pay | Admitting: Medical

## 2019-07-24 VITALS — Ht 66.0 in | Wt 210.0 lb

## 2019-07-24 DIAGNOSIS — M791 Myalgia, unspecified site: Secondary | ICD-10-CM | POA: Diagnosis not present

## 2019-07-24 DIAGNOSIS — R195 Other fecal abnormalities: Secondary | ICD-10-CM | POA: Diagnosis not present

## 2019-07-24 DIAGNOSIS — R509 Fever, unspecified: Secondary | ICD-10-CM | POA: Diagnosis not present

## 2019-07-24 DIAGNOSIS — R059 Cough, unspecified: Secondary | ICD-10-CM

## 2019-07-24 DIAGNOSIS — R05 Cough: Secondary | ICD-10-CM | POA: Diagnosis not present

## 2019-07-24 MED ORDER — BENZONATATE 100 MG PO CAPS: 100.0000 mg | ORAL_CAPSULE | Freq: Three times a day (TID) | ORAL | 0 refills | Status: DC | PRN

## 2019-07-24 NOTE — Patient Instructions (Addendum)
Your symptoms are worrisome for potential covid. Some concern for bronchitis.  Will go ahead and send in benzonatate for cough and continue  azithromycin antibiotic. If you have worse chest congestion or fever then would likely give doxycycline antibiotic. Holding new additional antibiotic since you have loose stools. But up date me by my chart if you worsen over weekend.   Tylenol for fever.   Get covid test tomorrow am.  Stay home and quarantine pending test result.  If you worsen significantly over the weekend then ED evaluation.  Follow up Monday or Tuesday next week.

## 2019-07-24 NOTE — Progress Notes (Signed)
   Subjective:    Patient ID: RAELEE Jefferson, female    DOB: [birth year], 68 y.o.   MRN: [id number]  HPI Virtual Visit via Video Note  I connected with [name] on 07/24/19 at  3:20 PM EST by a video enabled telemedicine application and verified that I am speaking with the correct person using two identifiers.  Location: Patient: home Provider: office   I discussed the limitations of evaluation and management by telemedicine and the availability of in person appointments. The patient expressed understanding and agreed to proceed.  History of Present Illness:  Pt has recent cough, fever and body aches over past 3 days.  She states arms are mostly sore. Fever up to 100.9. Pt states cough is productive. Pt has been on z-pack and has 2 days left. Treated for bronchitis on the 22 nd for bronchitis.  Pt had loose stools for about 2 weeks.  No vomiting.  Pt states no change in sense of smell. 4-5 loose stools in am. Then stops later in the day. Pt notes loose stools before started antibiotic.  Pt does not have 02 sat monitor at the house.   Observations/Objective: General-no acute distress, pleasant, oriented. Lungs- on inspection lungs appear unlabored. Neck- no tracheal deviation or jvd on inspection. Neuro- gross motor function appears intact.  Assessment and Plan: Your symptoms are worrisome for potential covid. Some concern for bronchitis.  Will go ahead and send in benzonatate for cough and continue  azithromycin antibiotic. If you have worse chest congestion or fever then would likely give doxycycline antibiotic. Holding new additional antibiotic since you have loose stools. But up date me by my chart if you worsen over weekend.   Tylenol for fever.   Get covid test tomorrow am.  Stay home and quarantine pending test result.  If you worsen significantly over the weekend then ED evaluation.  Follow up Monday or Tuesday next week.  Follow Up Instructions:    I discussed  the assessment and treatment plan with the patient. The patient was provided an opportunity to ask questions and all were answered. The patient agreed with the plan and demonstrated an understanding of the instructions.   The patient was advised to call back or seek an in-person evaluation if the symptoms worsen or if the condition fails to improve as anticipated.  I provided 25 minutes of non-face-to-face time during this encounter.   Mackie Pai, PA-C    Review of Systems     Objective:   Physical Exam        Assessment & Plan:

## 2019-07-25 ENCOUNTER — Encounter (HOSPITAL_COMMUNITY): Payer: Self-pay | Admitting: Emergency Medicine

## 2019-07-25 ENCOUNTER — Emergency Department (HOSPITAL_COMMUNITY): Payer: Medicare HMO

## 2019-07-25 ENCOUNTER — Telehealth: Payer: Self-pay | Admitting: Family Medicine

## 2019-07-25 ENCOUNTER — Ambulatory Visit: Payer: Medicare HMO

## 2019-07-25 ENCOUNTER — Other Ambulatory Visit: Payer: Self-pay

## 2019-07-25 ENCOUNTER — Inpatient Hospital Stay (HOSPITAL_COMMUNITY)
Admission: EM | Admit: 2019-07-25 | Discharge: 2019-07-29 | DRG: 178 | Disposition: A | Discharge status: Home Health | Payer: Medicare HMO | Attending: Family Medicine | Admitting: Family Medicine

## 2019-07-25 DIAGNOSIS — R531 Weakness: Secondary | ICD-10-CM | POA: Diagnosis not present

## 2019-07-25 DIAGNOSIS — Z20822 Contact with and (suspected) exposure to covid-19: Secondary | ICD-10-CM

## 2019-07-25 DIAGNOSIS — N183 Chronic kidney disease, stage 3 unspecified: Secondary | ICD-10-CM | POA: Diagnosis present

## 2019-07-25 DIAGNOSIS — Z809 Family history of malignant neoplasm, unspecified: Secondary | ICD-10-CM | POA: Diagnosis not present

## 2019-07-25 DIAGNOSIS — R0902 Hypoxemia: Secondary | ICD-10-CM

## 2019-07-25 DIAGNOSIS — I129 Hypertensive chronic kidney disease with stage 1 through stage 4 chronic kidney disease, or unspecified chronic kidney disease: Secondary | ICD-10-CM | POA: Diagnosis not present

## 2019-07-25 DIAGNOSIS — G4730 Sleep apnea, unspecified: Secondary | ICD-10-CM | POA: Diagnosis present

## 2019-07-25 DIAGNOSIS — Z96652 Presence of left artificial knee joint: Secondary | ICD-10-CM | POA: Diagnosis present

## 2019-07-25 DIAGNOSIS — Z7982 Long term (current) use of aspirin: Secondary | ICD-10-CM

## 2019-07-25 DIAGNOSIS — E785 Hyperlipidemia, unspecified: Secondary | ICD-10-CM | POA: Diagnosis present

## 2019-07-25 DIAGNOSIS — I950 Idiopathic hypotension: Secondary | ICD-10-CM

## 2019-07-25 DIAGNOSIS — Z87891 Personal history of nicotine dependence: Secondary | ICD-10-CM

## 2019-07-25 DIAGNOSIS — E876 Hypokalemia: Secondary | ICD-10-CM | POA: Diagnosis not present

## 2019-07-25 DIAGNOSIS — R6883 Chills (without fever): Secondary | ICD-10-CM

## 2019-07-25 DIAGNOSIS — E1140 Type 2 diabetes mellitus with diabetic neuropathy, unspecified: Secondary | ICD-10-CM | POA: Diagnosis present

## 2019-07-25 DIAGNOSIS — I252 Old myocardial infarction: Secondary | ICD-10-CM

## 2019-07-25 DIAGNOSIS — R7989 Other specified abnormal findings of blood chemistry: Secondary | ICD-10-CM | POA: Diagnosis not present

## 2019-07-25 DIAGNOSIS — Z955 Presence of coronary angioplasty implant and graft: Secondary | ICD-10-CM | POA: Diagnosis not present

## 2019-07-25 DIAGNOSIS — I959 Hypotension, unspecified: Secondary | ICD-10-CM | POA: Diagnosis present

## 2019-07-25 DIAGNOSIS — I2510 Atherosclerotic heart disease of native coronary artery without angina pectoris: Secondary | ICD-10-CM | POA: Diagnosis present

## 2019-07-25 DIAGNOSIS — G894 Chronic pain syndrome: Secondary | ICD-10-CM | POA: Diagnosis present

## 2019-07-25 DIAGNOSIS — N179 Acute kidney failure, unspecified: Secondary | ICD-10-CM | POA: Diagnosis present

## 2019-07-25 DIAGNOSIS — Z888 Allergy status to other drugs, medicaments and biological substances status: Secondary | ICD-10-CM

## 2019-07-25 DIAGNOSIS — E1169 Type 2 diabetes mellitus with other specified complication: Secondary | ICD-10-CM | POA: Diagnosis present

## 2019-07-25 DIAGNOSIS — N1832 Chronic kidney disease, stage 3b: Secondary | ICD-10-CM

## 2019-07-25 DIAGNOSIS — M1990 Unspecified osteoarthritis, unspecified site: Secondary | ICD-10-CM | POA: Diagnosis present

## 2019-07-25 DIAGNOSIS — R05 Cough: Secondary | ICD-10-CM | POA: Diagnosis not present

## 2019-07-25 DIAGNOSIS — Z96641 Presence of right artificial hip joint: Secondary | ICD-10-CM | POA: Diagnosis present

## 2019-07-25 DIAGNOSIS — E1121 Type 2 diabetes mellitus with diabetic nephropathy: Secondary | ICD-10-CM | POA: Diagnosis present

## 2019-07-25 DIAGNOSIS — R945 Abnormal results of liver function studies: Secondary | ICD-10-CM | POA: Diagnosis not present

## 2019-07-25 DIAGNOSIS — Z820 Family history of epilepsy and other diseases of the nervous system: Secondary | ICD-10-CM

## 2019-07-25 DIAGNOSIS — U071 COVID-19: Secondary | ICD-10-CM | POA: Diagnosis not present

## 2019-07-25 DIAGNOSIS — Z833 Family history of diabetes mellitus: Secondary | ICD-10-CM | POA: Diagnosis not present

## 2019-07-25 DIAGNOSIS — Z885 Allergy status to narcotic agent status: Secondary | ICD-10-CM

## 2019-07-25 DIAGNOSIS — R748 Abnormal levels of other serum enzymes: Secondary | ICD-10-CM

## 2019-07-25 DIAGNOSIS — Z880 Allergy status to penicillin: Secondary | ICD-10-CM

## 2019-07-25 DIAGNOSIS — E559 Vitamin D deficiency, unspecified: Secondary | ICD-10-CM | POA: Diagnosis present

## 2019-07-25 DIAGNOSIS — K219 Gastro-esophageal reflux disease without esophagitis: Secondary | ICD-10-CM | POA: Diagnosis not present

## 2019-07-25 DIAGNOSIS — R059 Cough, unspecified: Secondary | ICD-10-CM

## 2019-07-25 DIAGNOSIS — Z7902 Long term (current) use of antithrombotics/antiplatelets: Secondary | ICD-10-CM

## 2019-07-25 DIAGNOSIS — E1122 Type 2 diabetes mellitus with diabetic chronic kidney disease: Secondary | ICD-10-CM | POA: Diagnosis present

## 2019-07-25 DIAGNOSIS — R791 Abnormal coagulation profile: Secondary | ICD-10-CM | POA: Diagnosis not present

## 2019-07-25 DIAGNOSIS — Z20828 Contact with and (suspected) exposure to other viral communicable diseases: Secondary | ICD-10-CM | POA: Diagnosis not present

## 2019-07-25 DIAGNOSIS — I82451 Acute embolism and thrombosis of right peroneal vein: Secondary | ICD-10-CM | POA: Diagnosis not present

## 2019-07-25 DIAGNOSIS — D573 Sickle-cell trait: Secondary | ICD-10-CM | POA: Diagnosis present

## 2019-07-25 DIAGNOSIS — E860 Dehydration: Secondary | ICD-10-CM | POA: Diagnosis present

## 2019-07-25 DIAGNOSIS — J4540 Moderate persistent asthma, uncomplicated: Secondary | ICD-10-CM | POA: Diagnosis present

## 2019-07-25 DIAGNOSIS — Z91018 Allergy to other foods: Secondary | ICD-10-CM

## 2019-07-25 DIAGNOSIS — Z8673 Personal history of transient ischemic attack (TIA), and cerebral infarction without residual deficits: Secondary | ICD-10-CM

## 2019-07-25 DIAGNOSIS — Z794 Long term (current) use of insulin: Secondary | ICD-10-CM

## 2019-07-25 LAB — CBC WITH DIFFERENTIAL/PLATELET
Abs Immature Granulocytes: 0.03 10*3/uL (ref 0.00–0.07)
Basophils Absolute: 0.1 10*3/uL (ref 0.0–0.1)
Basophils Relative: 1 %
Eosinophils Absolute: 0.6 10*3/uL — ABNORMAL HIGH (ref 0.0–0.5)
Eosinophils Relative: 8 %
HCT: 35.3 % — ABNORMAL LOW (ref 36.0–46.0)
Hemoglobin: 11.8 g/dL — ABNORMAL LOW (ref 12.0–15.0)
Immature Granulocytes: 0 %
Lymphocytes Relative: 40 %
Lymphs Abs: 2.9 10*3/uL (ref 0.7–4.0)
MCH: 28.9 pg (ref 26.0–34.0)
MCHC: 33.4 g/dL (ref 30.0–36.0)
MCV: 86.5 fL (ref 80.0–100.0)
Monocytes Absolute: 0.7 10*3/uL (ref 0.1–1.0)
Monocytes Relative: 10 %
Neutro Abs: 3 10*3/uL (ref 1.7–7.7)
Neutrophils Relative %: 41 %
Platelets: 189 10*3/uL (ref 150–400)
RBC: 4.08 MIL/uL (ref 3.87–5.11)
RDW: 16.7 % — ABNORMAL HIGH (ref 11.5–15.5)
WBC: 7.2 10*3/uL (ref 4.0–10.5)
nRBC: 0.3 % — ABNORMAL HIGH (ref 0.0–0.2)

## 2019-07-25 LAB — LIPASE, BLOOD: Lipase: 14 U/L (ref 11–51)

## 2019-07-25 LAB — COMPREHENSIVE METABOLIC PANEL
ALT: 91 U/L — ABNORMAL HIGH (ref 0–44)
AST: 192 U/L — ABNORMAL HIGH (ref 15–41)
Albumin: 2 g/dL — ABNORMAL LOW (ref 3.5–5.0)
Alkaline Phosphatase: 118 U/L (ref 38–126)
Anion gap: 9 (ref 5–15)
BUN: 16 mg/dL (ref 8–23)
CO2: 22 mmol/L (ref 22–32)
Calcium: 8 mg/dL — ABNORMAL LOW (ref 8.9–10.3)
Chloride: 114 mmol/L — ABNORMAL HIGH (ref 98–111)
Creatinine, Ser: 1.93 mg/dL — ABNORMAL HIGH (ref 0.44–1.00)
GFR calc Af Amer: 30 mL/min — ABNORMAL LOW (ref >60)
GFR calc non Af Amer: 26 mL/min — ABNORMAL LOW (ref >60)
Glucose, Bld: 104 mg/dL — ABNORMAL HIGH (ref 70–99)
Potassium: 4.3 mmol/L (ref 3.5–5.1)
Sodium: 145 mmol/L (ref 135–145)
Total Bilirubin: 0.8 mg/dL (ref 0.3–1.2)
Total Protein: 5.5 g/dL — ABNORMAL LOW (ref 6.5–8.1)

## 2019-07-25 LAB — FERRITIN: Ferritin: 468 ng/mL — ABNORMAL HIGH (ref 11–307)

## 2019-07-25 LAB — C-REACTIVE PROTEIN: CRP: 0.9 mg/dL (ref <1.0)

## 2019-07-25 LAB — POC SARS CORONAVIRUS 2 AG -  ED: SARS Coronavirus 2 Ag: POSITIVE — AB

## 2019-07-25 LAB — D-DIMER, QUANTITATIVE: D-Dimer, Quant: 2.49 ug/mL-FEU — ABNORMAL HIGH (ref 0.00–0.50)

## 2019-07-25 LAB — LACTATE DEHYDROGENASE: LDH: 292 U/L — ABNORMAL HIGH (ref 98–192)

## 2019-07-25 MED ORDER — SODIUM CHLORIDE 0.9 % IV BOLUS
1000.0000 mL | Freq: Once | INTRAVENOUS | Status: AC
  Administered 2019-07-25: 1000 mL via INTRAVENOUS

## 2019-07-25 NOTE — ED Provider Notes (Addendum)
[facility] DEPT Provider Note   CSN: [id number] Arrival date & time: 07/25/19  4081     History Chief Complaint  Patient presents with  . COVID +    [name] is a 68 y.o. female.  Level 5 caveat for acuity of condition.  Cough, chills, achiness, diarrhea, weakness for approximately 1 week.  Patient had a video visit with her primary care doctor and was placed on Zithromax  (Day 3 out of 4).  She lives independently in her home.  She has been able to get around the house.  Nursing notes report for falls in the past couple months.  She was initially hypotensive, but this improved with hydration.        Past Medical History:  Diagnosis Date  . Asthma   . Degenerative joint disease   . Diabetes mellitus   . Dyspnea   . GERD (gastroesophageal reflux disease)   . Hyperlipidemia    no longer on medication for this  . Hypertension    patient denies  . Myocardial infarction ([place]) 08/2018  . Neuropathy    bilateral hands and feet  . Sickle cell trait ([place])   . Sleep apnea   . stomach ca dx'd 2010   chemo/xrt comp 05/2009  . TIA (transient ischemic attack) 07/2015    Patient Active Problem List   Diagnosis Date Noted  . Allergic contact dermatitis due to adhesives 05/23/2019  . Insomnia 05/23/2019  . SIRS (systemic inflammatory response syndrome) ([place]) 05/14/2019  . NSTEMI (non-ST elevated myocardial infarction) ([place]) 05/14/2019  . Hyperglycemia   . Neutropenia ([place])   . CAD (coronary artery disease)   . Obesity with serious comorbidity 04/16/2019  . Gastroenteritis 03/27/2019  . Falls frequently 03/27/2019  . Acute gastroenteritis 03/22/2019  . Hematoma of scalp 03/14/2019  . Vitamin D deficiency 11/09/2018  . Muscle spasm 10/19/2018  . Chest pain 09/07/2018  . Palpitations 09/07/2018  . Other fatigue 09/07/2018  . Acute ST elevation myocardial infarction (STEMI) of inferior wall ([place]) 08/27/2018    Class: Hospitalized for  .  Coronary artery disease involving native coronary artery of native heart with unstable angina pectoris ([place]) 08/27/2018    Class: Diagnosis of  . Acute vaginitis 08/20/2018  . Hyperlipidemia associated with type 2 diabetes mellitus ([place]) 04/09/2018  . Bronchitis 11/02/2017  . Sepsis ([place]) 11/02/2017  . Leucocytosis 11/02/2017  . Moderate persistent asthma with acute exacerbation 05/26/2017  . Influenza A 05/26/2017  . Acute pain of right shoulder 03/07/2017  . Acute bilateral low back pain without sciatica 03/07/2017  . Chronic pain 03/07/2017  . Fatty liver 03/24/2016  . Weakness 03/23/2016  . Unexplained weight loss 03/23/2016  . Melena 03/23/2016  . Hypotension 02/03/2016  . Syncope 02/03/2016  . Hypomagnesemia 02/03/2016  . AKI (acute kidney injury) ([place]) 02/03/2016  . Gastrointestinal dysmotility 01/20/2016  . CKD (chronic kidney disease) stage 3, GFR 30-59 ml/min (H[facility]) 01/20/2016  . Abscess of right thigh 01/20/2016  . Chronic pain syndrome 01/15/2016  . Diarrhea 01/15/2016  . Generalized abdominal pain 01/14/2016  . Type 2 diabetes with nephropathy ([place]) 12/30/2015  . TIA (transient ischemic attack) 07/2015  . Morbid obesity due to excess calories ([place]) 05/05/2015  . Diabetic polyneuropathy associated with type 2 diabetes mellitus ([place]) 01/02/2015  . Acute bacterial sinusitis 01/02/2015  . Onychomycosis 11/04/2014  . Pain in lower limb 11/04/2014  . Multinodular goiter (nontoxic) 10/16/2014  . History of CVA (cerebrovascular accident) 08/11/2014  . Iron  deficiency anemia 05/20/2014  . Major depressive disorder, single episode, moderate ([place]) 05/20/2014  . Dizziness of unknown cause 04/28/2014  . MDD (major depressive disorder), recurrent episode, moderate ([place]) 01/28/2014  . GERD (gastroesophageal reflux disease) 01/02/2014  . Nausea 10/15/2012  . Stomach cancer ([place]) 12/10/2008  . CHRONIC RESPIRATORY FAILURE 01/03/2008  . Hyperlipidemia 06/27/2007  . OBESITY,  MORBID 06/27/2007  . SLEEP APNEA, OBSTRUCTIVE 06/27/2007  . Generalized idiopathic epilepsy and epileptic syndromes, not intractable, with status epilepticus ([place]) 06/27/2007  . Asthma 06/27/2007  . OSTEOARTHRITIS  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 06/27/2007    Past Surgical History:  Procedure Laterality Date  . APPENDECTOMY    . CESAREAN SECTION    . COLON SURGERY     colonscopy  . COLONOSCOPY WITH PROPOFOL N/A 06/01/2016   Procedure: COLONOSCOPY WITH PROPOFOL;  Surgeon: Wilford Corner, MD;  Location: [facility] ENDOSCOPY;  Service: Endoscopy;  Laterality: N/A;  . CORONARY STENT INTERVENTION N/A 08/29/2018   Procedure: CORONARY STENT INTERVENTION;  Surgeon: Wellington Hampshire, MD;  Location: [facility] CV LAB;  Service: Cardiovascular;  Laterality: N/A;  . CORONARY/GRAFT ACUTE MI REVASCULARIZATION N/A 08/27/2018   Procedure: Coronary/Graft Acute MI Revascularization;  Surgeon: Leonie Man, MD;  Location: [facility] CV LAB;  Service: Cardiovascular;  Laterality: N/A;  . ESOPHAGOGASTRODUODENOSCOPY N/A 10/15/2012   Procedure: ESOPHAGOGASTRODUODENOSCOPY (EGD);  Surgeon: Lear Ng, MD;  Location: [facility] ENDOSCOPY;  Service: Endoscopy;  Laterality: N/A;  . ESOPHAGOGASTRODUODENOSCOPY N/A 01/15/2016   Procedure: ESOPHAGOGASTRODUODENOSCOPY (EGD);  Surgeon: Ronald Lobo, MD;  Location: [facility] ENDOSCOPY;  Service: Endoscopy;  Laterality: N/A;  . HERNIA REPAIR    . LEFT HEART CATH AND CORONARY ANGIOGRAPHY N/A 08/27/2018   Procedure: LEFT HEART CATH AND CORONARY ANGIOGRAPHY;  Surgeon: Leonie Man, MD;  Location: [facility] CV LAB;  Service: Cardiovascular;  Laterality: N/A;  . SHOULDER SURGERY     Left  . TOTAL HIP ARTHROPLASTY     Right  . TOTAL KNEE ARTHROPLASTY     Left     OB History    Gravida  4   Para  4   Term      Preterm      AB      Living  4     SAB      TAB      Ectopic      Multiple      Live Births              Family History  Problem Relation Age of  Onset  . Diabetes Brother   . Alzheimer's disease Father 27  . Cancer Mother 71  . Diabetes Maternal Grandmother   . Cancer Maternal Grandfather        lung  . Healthy Child   . Suicidality Neg Hx   . Depression Neg Hx   . Dementia Neg Hx   . Anxiety disorder Neg Hx     Social History   Tobacco Use  . Smoking status: Former Smoker    Packs/day: 1.00    Years: 15.00    Pack years: 15.00    Types: Cigarettes    Start date: 09/27/1973    Quit date: 10/15/1988    Years since quitting: 30.7  . Smokeless tobacco: Never Used  . Tobacco comment: quit smoking 14 years ago  Substance Use Topics  . Alcohol use: No    Alcohol/week: 0.0 standard drinks  . Drug use: No    Home Medications  Prior to Admission medications   Medication Sig Start Date End Date Taking? Authorizing Provider  albuterol (PROVENTIL HFA;VENTOLIN HFA) 108 (90 Base) MCG/ACT inhaler Inhale 2 puffs into the lungs every 6 (six) hours as needed for wheezing or shortness of breath. 11/05/17  Yes Emokpae, Courage, MD  albuterol (PROVENTIL) (2.5 MG/3ML) 0.083% nebulizer solution Take 3 mLs (2.5 mg total) by nebulization every 6 (six) hours as needed for wheezing or shortness of breath. 11/05/17  Yes Emokpae, Courage, MD  aspirin EC 81 MG tablet Take 1 tablet (81 mg total) by mouth daily. 02/20/19  Yes Roma Schanz R, DO  azithromycin (ZITHROMAX Z-PAK) 250 MG tablet As directed Patient taking differently: Take 250-500 mg by mouth See admin instructions. Take 2 tablets by mouth on day 1, then take 1 tablet by mouth on days 2-5. 07/16/19  Yes Carollee Herter, Alferd Apa, DO  clopidogrel (PLAVIX) 75 MG tablet Take 1 tablet (75 mg total) by mouth daily. 05/13/19  Yes Black, Lezlie Octave, NP  darifenacin (ENABLEX) 7.5 MG 24 hr tablet Take 7.5 mg by mouth daily. 07/23/19  Yes [provider]  famotidine (PEPCID) 40 MG tablet Take 1 tablet (40 mg total) by mouth daily. 02/12/19  Yes Roma Schanz R, DO  gabapentin (NEURONTIN)  800 MG tablet TAKE ONE TABLET BY MOUTH THREE TIMES DAILY ;; Patient taking differently: Take 800 mg by mouth 3 (three) times daily.  07/01/19  Yes Roma Schanz R, DO  glucose blood (ONETOUCH VERIO) test strip Use as instructed to check blood sugar 3 times a day 09/04/18  Yes Philemon Kingdom, MD  Insulin Pen Needle (B-D UF III MINI PEN NEEDLES) 31G X 5 MM MISC Use to inject insulin once a day. 01/07/19  Yes Philemon Kingdom, MD  isosorbide mononitrate (IMDUR) 30 MG 24 hr tablet TAKE ONE HALF TABLET BY MOUTH EVERY DAY PATIENT NEEDS APPOINTMENT BEFORE NEXT REFILL. Patient taking differently: Take 15 mg by mouth daily.  07/01/19  Yes Lendon Colonel, NP  lidocaine-prilocaine (EMLA) cream Apply 1 application topically as needed. Patient taking differently: Apply 1 application topically as needed (to numb port site.).  02/28/19  Yes Volanda Napoleon, MD  metFORMIN (GLUCOPHAGE) 500 MG tablet Take 1 tablet (500 mg total) by mouth 2 (two) times daily with a meal. 02/13/19  Yes Philemon Kingdom, MD  metoprolol tartrate (LOPRESSOR) 25 MG tablet Take 1 tablet (25 mg total) by mouth 2 (two) times daily. 01/14/19  Yes Lorretta Harp, MD  Multiple Vitamins-Minerals (CENTRUM SILVER ADULT 50+) TABS Take 1 tablet once a day 02/20/19  Yes Roma Schanz R, DO  nitroGLYCERIN (NITROSTAT) 0.4 MG SL tablet Place 1 tablet (0.4 mg total) under the tongue every 5 (five) minutes as needed. Patient taking differently: Place 0.4 mg under the tongue every 5 (five) minutes as needed for chest pain.  01/14/19  Yes Lorretta Harp, MD  nystatin (MYCOSTATIN) 100000 UNIT/ML suspension 5 ml po swish and spit qid Patient taking differently: Use as directed 5 mLs in the mouth or throat 4 (four) times daily. Swish and spit 5 mL by mouth 4 times daily 07/03/19  Yes Lowne Lyndal Pulley R, DO  pantoprazole (PROTONIX) 40 MG tablet Take 1 tablet (40 mg total) by mouth daily. 07/16/19  Yes Roma Schanz R, DO  rosuvastatin  (CRESTOR) 40 MG tablet TAKE ONE TABLET BY MOUTH EVERY DAY AT BEDTIME Patient taking differently: Take 40 mg by mouth daily.  03/01/19  Yes Ellyn Hack,  Leonie Green, MD  sertraline (ZOLOFT) 50 MG tablet Take 1 tablet (50 mg total) by mouth daily. 02/20/19  Yes Roma Schanz R, DO  sucralfate (CARAFATE) 1 g tablet TAKE 1 TABLET BY MOUTH TWICE DAILY Patient taking differently: Take 1 g by mouth 2 (two) times daily.  02/05/19  Yes [place], Holli Humbles, NP  traMADol (ULTRAM) 50 MG tablet TAKE TWO TABLETS BY MOUTH THREE TIMES DAILY AS NEEDED FOR MODERATE PAIN Patient taking differently: Take 100 mg by mouth 3 (three) times daily as needed for moderate pain.  04/05/19  Yes Roma Schanz R, DO  benzonatate (TESSALON) 100 MG capsule Take 1 capsule (100 mg total) by mouth 3 (three) times daily as needed for cough. 07/24/19   Saguier, Percell Miller, PA-C  Hyoscyamine Sulfate SL (LEVSIN/SL) 0.125 MG SUBL 1-2 SL q4h prn diarrhea Patient not taking: Reported on 07/25/2019 05/23/19   Carollee Herter, Alferd Apa, DO  Insulin Degludec (TRESIBA FLEXTOUCH) 200 UNIT/ML SOPN Inject 20 Units into the skin daily. Patient not taking: Reported on 07/25/2019 08/31/18   Philemon Kingdom, MD  ipratropium-albuterol (DUONEB) 0.5-2.5 (3) MG/3ML SOLN Take 3 mLs by nebulization every 6 (six) hours as needed. Patient not taking: Reported on 07/25/2019 05/26/17   Carollee Herter, Kendrick Fries R, DO  ondansetron (ZOFRAN) 4 MG tablet TAKE ONE TABLET BY MOUTH EVERY EIGHT HOURS AS NEEDED FOR NAUSEA AND VOMITING Patient not taking: Reported on 07/25/2019 07/01/19   Volanda Napoleon, MD  Semaglutide,0.25 or 0.5MG/DOS, (OZEMPIC, 0.25 OR 0.5 MG/DOSE,) 2 MG/1.5ML SOPN Inject 0.5 mg into the skin once a week. Patient not taking: Reported on 07/25/2019 04/16/19   Philemon Kingdom, MD  traZODone (DESYREL) 50 MG tablet Take 0.5-1 tablets (25-50 mg total) by mouth at bedtime as needed for sleep. Patient not taking: Reported on 07/25/2019 05/23/19   Roma Schanz R,  DO    Allergies    Adhesive [tape], Codeine, Zocor [simvastatin - high dose], and Penicillins  Review of Systems   Review of Systems  Unable to perform ROS: Acuity of condition    Physical Exam Updated Vital Signs BP 98/68   Pulse 75   Temp 99.5 F (37.5 C) (Oral)   Resp (!) 21   SpO2 95%   Physical Exam Vitals and nursing note reviewed.  Constitutional:      Appearance: She is well-developed.     Comments: Mentating well, good color, hypotensive  HENT:     Head: Normocephalic and atraumatic.  Eyes:     Conjunctiva/sclera: Conjunctivae normal.  Cardiovascular:     Rate and Rhythm: Normal rate and regular rhythm.  Pulmonary:     Effort: Pulmonary effort is normal.     Breath sounds: Normal breath sounds.  Abdominal:     General: Bowel sounds are normal.     Palpations: Abdomen is soft.  Musculoskeletal:        General: Normal range of motion.     Cervical back: Neck supple.  Skin:    General: Skin is warm and dry.  Neurological:     General: No focal deficit present.     Mental Status: She is alert and oriented to person, place, and time.  Psychiatric:        Behavior: Behavior normal.     ED Results / Procedures / Treatments   Labs (all labs ordered are listed, but only abnormal results are displayed) Labs Reviewed  CBC WITH DIFFERENTIAL/PLATELET - Abnormal; Notable for the following components:  Result Value   Hemoglobin 11.8 (*)    HCT 35.3 (*)    RDW 16.7 (*)    nRBC 0.3 (*)    Eosinophils Absolute 0.6 (*)    All other components within normal limits  COMPREHENSIVE METABOLIC PANEL - Abnormal; Notable for the following components:   Chloride 114 (*)    Glucose, Bld 104 (*)    Creatinine, Ser 1.93 (*)    Calcium 8.0 (*)    Total Protein 5.5 (*)    Albumin 2.0 (*)    AST 192 (*)    ALT 91 (*)    GFR calc non Af Amer 26 (*)    GFR calc Af Amer 30 (*)    All other components within normal limits  POC SARS CORONAVIRUS 2 AG -  ED - Abnormal;  Notable for the following components:   SARS Coronavirus 2 Ag POSITIVE (*)    All other components within normal limits  LIPASE, BLOOD  URINALYSIS, ROUTINE W REFLEX MICROSCOPIC    EKG EKG Interpretation  Date/Time:  Thursday July 25 2019 09:19:25 EST Ventricular Rate:  84 PR Interval:    QRS Duration: 82 QT Interval:  354 QTC Calculation: 419 R Axis:   -27 Text Interpretation: Sinus rhythm Borderline left axis deviation Borderline low voltage, extremity leads Confirmed by Nat Christen [phone]) on 07/25/2019 9:46:55 AM   Radiology DG Chest Port 1 View  Result Date: 07/25/2019 CLINICAL DATA:  Fever, chills EXAM: PORTABLE CHEST 1 VIEW COMPARISON:  05/14/2019 FINDINGS: Right Port-A-Cath in place, unchanged. Heart and mediastinal contours are within normal limits. No focal opacities or effusions. No acute bony abnormality. IMPRESSION: No active disease. Electronically Signed   By: Rolm Baptise M.D.   On: 07/25/2019 11:44    Procedures Procedures (including critical care time)  Medications Ordered in ED Medications  sodium chloride 0.9 % bolus 1,000 mL (1,000 mLs Intravenous New Bag/Given 07/25/19 1572)    ED Course  I have reviewed the triage vital signs and the nursing notes.  Pertinent labs & imaging results that were available during my care of the patient were reviewed by me and considered in my medical decision making (see chart for details).    MDM Rules/Calculators/A&P                      Persistent chills, weakness, fatigue, diarrhea.  Blood pressure soft.  Will hydrate, basic labs, Covid, chest x-ray.  Probable admission.  1200: Blood pressure remains soft.  Covid positive.  Liver functions mildly elevated.  Admit.  CRITICAL CARE Performed by: Nat Christen Total critical care time: 30 minutes Critical care time was exclusive of separately billable procedures and treating other patients. Critical care was necessary to treat or prevent imminent or  life-threatening deterioration. Critical care was time spent personally by me on the following activities: development of treatment plan with patient and/or surrogate as well as nursing, discussions with consultants, evaluation of patient's response to treatment, examination of patient, obtaining history from patient or surrogate, ordering and performing treatments and interventions, ordering and review of laboratory studies, ordering and review of radiographic studies, pulse oximetry and re-evaluation of patient's condition. Final Clinical Impression(s) / ED Diagnoses Final diagnoses:  Cough  Chills  Weakness  COVID-19  Elevated liver enzymes    Rx / DC Orders ED Discharge Orders    None       Nat Christen, MD 07/25/19 1021    Nat Christen, MD 07/25/19 1207

## 2019-07-25 NOTE — ED Triage Notes (Signed)
Pt had diarrhea for over week. Had cough, body aches, chills for several. Been taking antibiotics (z-pack) 4 days ago. Went to [facility] this am for covid testing. Reports 4 falls in past couple months.

## 2019-07-25 NOTE — Telephone Encounter (Signed)
Patient is calling to let Dr. Carollee Herter know that she at [facility]- [phone]

## 2019-07-25 NOTE — ED Notes (Signed)
When RN went to assess patient, systolic BP in [name]. RN bolused patient with NS. Patient now [name] systolic. MD notified.

## 2019-07-25 NOTE — ED Notes (Signed)
Patient asked RN to bring check in envelope to daughter Altha Harm) who was at the front of the ER so daughter can help her pay her rent. RN brought check out to daughter at this time. Tim, Therapist, sports (charge) witnessed Chrys Racer, RN bring check out to patient's daughter.   Daughter would like to be called with updates. (see demographics)

## 2019-07-25 NOTE — ED Notes (Signed)
Patient put on own EMLA cream at this time. Will access port in about an hour.

## 2019-07-25 NOTE — H&P (Signed)
.  History and Physical    [name] SLH:[id number] DOB: 01/26/1951 DOA: 07/25/2019  PCP: Ann Held, DO  Patient coming from: Home   Chief Complaint: Cough, dyspnea  HPI: [name] is a 68 y.o. female with medical history significant of DM2, HLD, Asthma. She reports that she's been suffering for increased cough/dyspnea for about a week or so. OTCs had not helped and it seemed out of the ordinary for her. She denies any aggravating factors. She had a video visit with her PCP. At that time she was prescribed a z-pack. She had just about finished it up but noticed that she had no real improvement in her symptoms. So she decided to come to the ED.   ED Course: CXR showed no disease. She was somewhat hypotensive. Found to be in AKI and COVID+. TRH was called for admission.   Review of Systems: Denies CP, N, V, HA. Reports dyspnea, cough. Remainder of 10 point review of systems is otherwise negative for all not mentioned in HPI.    Past Medical History:  Diagnosis Date  . Asthma   . Degenerative joint disease   . Diabetes mellitus   . Dyspnea   . GERD (gastroesophageal reflux disease)   . Hyperlipidemia    no longer on medication for this  . Hypertension    patient denies  . Myocardial infarction ([place]) 08/2018  . Neuropathy    bilateral hands and feet  . Sickle cell trait ([place])   . Sleep apnea   . stomach ca dx'd 2010   chemo/xrt comp 05/2009  . TIA (transient ischemic attack) 07/2015    Past Surgical History:  Procedure Laterality Date  . APPENDECTOMY    . CESAREAN SECTION    . COLON SURGERY     colonscopy  . COLONOSCOPY WITH PROPOFOL N/A 06/01/2016   Procedure: COLONOSCOPY WITH PROPOFOL;  Surgeon: Wilford Corner, MD;  Location: [facility] ENDOSCOPY;  Service: Endoscopy;  Laterality: N/A;  . CORONARY STENT INTERVENTION N/A 08/29/2018   Procedure: CORONARY STENT INTERVENTION;  Surgeon: Wellington Hampshire, MD;  Location: [place] CV LAB;  Service: Cardiovascular;   Laterality: N/A;  . CORONARY/GRAFT ACUTE MI REVASCULARIZATION N/A 08/27/2018   Procedure: Coronary/Graft Acute MI Revascularization;  Surgeon: Leonie Man, MD;  Location: [place] CV LAB;  Service: Cardiovascular;  Laterality: N/A;  . ESOPHAGOGASTRODUODENOSCOPY N/A 10/15/2012   Procedure: ESOPHAGOGASTRODUODENOSCOPY (EGD);  Surgeon: Lear Ng, MD;  Location: [facility];  Service: Endoscopy;  Laterality: N/A;  . ESOPHAGOGASTRODUODENOSCOPY N/A 01/15/2016   Procedure: ESOPHAGOGASTRODUODENOSCOPY (EGD);  Surgeon: Ronald Lobo, MD;  Location: [facility] ENDOSCOPY;  Service: Endoscopy;  Laterality: N/A;  . HERNIA REPAIR    . LEFT HEART CATH AND CORONARY ANGIOGRAPHY N/A 08/27/2018   Procedure: LEFT HEART CATH AND CORONARY ANGIOGRAPHY;  Surgeon: Leonie Man, MD;  Location: [place] CV LAB;  Service: Cardiovascular;  Laterality: N/A;  . SHOULDER SURGERY     Left  . TOTAL HIP ARTHROPLASTY     Right  . TOTAL KNEE ARTHROPLASTY     Left     reports that she quit smoking about 30 years ago. Her smoking use included cigarettes. She started smoking about 45 years ago. She has a 15.00 pack-year smoking history. She has never used smokeless tobacco. She reports that she does not drink alcohol or use drugs.  Allergies  Allergen Reactions  . Adhesive [Tape] Other (See Comments)    Burn Skin  . Codeine Other (See Comments)  paranoid  . Zocor [Simvastatin - High Dose] Other (See Comments)    Muscle spasms  . Penicillins Rash    Has patient had a PCN reaction causing immediate rash, facial/tongue/throat swelling, SOB or lightheadedness with hypotension: Yes Has patient had a PCN reaction causing severe rash involving mucus membranes or skin necrosis: No Has patient had a PCN reaction that required hospitalization does not remember.  Has patient had a PCN reaction occurring within the last 10 years: childhood If all of the above answers are "NO", then may proceed with Cephalosporin use.      Family History  Problem Relation Age of Onset  . Diabetes Brother   . Alzheimer's disease Father 65  . Cancer Mother 106  . Diabetes Maternal Grandmother   . Cancer Maternal Grandfather        lung  . Healthy Child   . Suicidality Neg Hx   . Depression Neg Hx   . Dementia Neg Hx   . Anxiety disorder Neg Hx     Prior to Admission medications   Medication Sig Start Date End Date Taking? Authorizing Provider  albuterol (PROVENTIL HFA;VENTOLIN HFA) 108 (90 Base) MCG/ACT inhaler Inhale 2 puffs into the lungs every 6 (six) hours as needed for wheezing or shortness of breath. 11/05/17  Yes Emokpae, Courage, MD  albuterol (PROVENTIL) (2.5 MG/3ML) 0.083% nebulizer solution Take 3 mLs (2.5 mg total) by nebulization every 6 (six) hours as needed for wheezing or shortness of breath. 11/05/17  Yes Emokpae, Courage, MD  aspirin EC 81 MG tablet Take 1 tablet (81 mg total) by mouth daily. 02/20/19  Yes Roma Schanz R, DO  azithromycin (ZITHROMAX Z-PAK) 250 MG tablet As directed Patient taking differently: Take 250-500 mg by mouth See admin instructions. Take 2 tablets by mouth on day 1, then take 1 tablet by mouth on days 2-5. 07/16/19  Yes Carollee Herter, Alferd Apa, DO  clopidogrel (PLAVIX) 75 MG tablet Take 1 tablet (75 mg total) by mouth daily. 05/13/19  Yes Black, Lezlie Octave, NP  darifenacin (ENABLEX) 7.5 MG 24 hr tablet Take 7.5 mg by mouth daily. 07/23/19  Yes [provider]  famotidine (PEPCID) 40 MG tablet Take 1 tablet (40 mg total) by mouth daily. 02/12/19  Yes Roma Schanz R, DO  gabapentin (NEURONTIN) 800 MG tablet TAKE ONE TABLET BY MOUTH THREE TIMES DAILY ;; Patient taking differently: Take 800 mg by mouth 3 (three) times daily.  07/01/19  Yes Roma Schanz R, DO  glucose blood (ONETOUCH VERIO) test strip Use as instructed to check blood sugar 3 times a day 09/04/18  Yes Philemon Kingdom, MD  Insulin Pen Needle (B-D UF III MINI PEN NEEDLES) 31G X 5 MM MISC Use to  inject insulin once a day. 01/07/19  Yes Philemon Kingdom, MD  isosorbide mononitrate (IMDUR) 30 MG 24 hr tablet TAKE ONE HALF TABLET BY MOUTH EVERY DAY PATIENT NEEDS APPOINTMENT BEFORE NEXT REFILL. Patient taking differently: Take 15 mg by mouth daily.  07/01/19  Yes Lendon Colonel, NP  lidocaine-prilocaine (EMLA) cream Apply 1 application topically as needed. Patient taking differently: Apply 1 application topically as needed (to numb port site.).  02/28/19  Yes Volanda Napoleon, MD  metFORMIN (GLUCOPHAGE) 500 MG tablet Take 1 tablet (500 mg total) by mouth 2 (two) times daily with a meal. 02/13/19  Yes Philemon Kingdom, MD  metoprolol tartrate (LOPRESSOR) 25 MG tablet Take 1 tablet (25 mg total) by mouth 2 (two) times daily. 01/14/19  Yes Lorretta Harp, MD  Multiple Vitamins-Minerals (CENTRUM SILVER ADULT 50+) TABS Take 1 tablet once a day 02/20/19  Yes Roma Schanz R, DO  nitroGLYCERIN (NITROSTAT) 0.4 MG SL tablet Place 1 tablet (0.4 mg total) under the tongue every 5 (five) minutes as needed. Patient taking differently: Place 0.4 mg under the tongue every 5 (five) minutes as needed for chest pain.  01/14/19  Yes Lorretta Harp, MD  nystatin (MYCOSTATIN) 100000 UNIT/ML suspension 5 ml po swish and spit qid Patient taking differently: Use as directed 5 mLs in the mouth or throat 4 (four) times daily. Swish and spit 5 mL by mouth 4 times daily 07/03/19  Yes Lowne Lyndal Pulley R, DO  pantoprazole (PROTONIX) 40 MG tablet Take 1 tablet (40 mg total) by mouth daily. 07/16/19  Yes Roma Schanz R, DO  rosuvastatin (CRESTOR) 40 MG tablet TAKE ONE TABLET BY MOUTH EVERY DAY AT BEDTIME Patient taking differently: Take 40 mg by mouth daily.  03/01/19  Yes Leonie Man, MD  sertraline (ZOLOFT) 50 MG tablet Take 1 tablet (50 mg total) by mouth daily. 02/20/19  Yes Roma Schanz R, DO  sucralfate (CARAFATE) 1 g tablet TAKE 1 TABLET BY MOUTH TWICE DAILY Patient taking differently: Take  1 g by mouth 2 (two) times daily.  02/05/19  Yes [place], Holli Humbles, NP  traMADol (ULTRAM) 50 MG tablet TAKE TWO TABLETS BY MOUTH THREE TIMES DAILY AS NEEDED FOR MODERATE PAIN Patient taking differently: Take 100 mg by mouth 3 (three) times daily as needed for moderate pain.  04/05/19  Yes Roma Schanz R, DO  benzonatate (TESSALON) 100 MG capsule Take 1 capsule (100 mg total) by mouth 3 (three) times daily as needed for cough. 07/24/19   Saguier, Percell Miller, PA-C  Hyoscyamine Sulfate SL (LEVSIN/SL) 0.125 MG SUBL 1-2 SL q4h prn diarrhea Patient not taking: Reported on 07/25/2019 05/23/19   Carollee Herter, Alferd Apa, DO  Insulin Degludec (TRESIBA FLEXTOUCH) 200 UNIT/ML SOPN Inject 20 Units into the skin daily. Patient not taking: Reported on 07/25/2019 08/31/18   Philemon Kingdom, MD  ipratropium-albuterol (DUONEB) 0.5-2.5 (3) MG/3ML SOLN Take 3 mLs by nebulization every 6 (six) hours as needed. Patient not taking: Reported on 07/25/2019 05/26/17   Carollee Herter, Kendrick Fries R, DO  ondansetron (ZOFRAN) 4 MG tablet TAKE ONE TABLET BY MOUTH EVERY EIGHT HOURS AS NEEDED FOR NAUSEA AND VOMITING Patient not taking: Reported on 07/25/2019 07/01/19   Volanda Napoleon, MD  Semaglutide,0.25 or 0.5MG/DOS, (OZEMPIC, 0.25 OR 0.5 MG/DOSE,) 2 MG/1.5ML SOPN Inject 0.5 mg into the skin once a week. Patient not taking: Reported on 07/25/2019 04/16/19   Philemon Kingdom, MD  traZODone (DESYREL) 50 MG tablet Take 0.5-1 tablets (25-50 mg total) by mouth at bedtime as needed for sleep. Patient not taking: Reported on 07/25/2019 05/23/19   Ann Held, DO    Physical Exam: Vitals:   07/25/19 1440 07/25/19 1441 07/25/19 1500 07/25/19 1520  BP:  (!) 137/121 (!) 103/56 92/66  Pulse:  74 74 75  Resp: 20 (!) 22 18 18   Temp:      TempSrc:      SpO2:  100% 100% 100%    Constitutional: 68 y.o. female NAD, calm, comfortable Vitals:   07/25/19 1440 07/25/19 1441 07/25/19 1500 07/25/19 1520  BP:  (!) 137/121 (!) 103/56  92/66  Pulse:  74 74 75  Resp: 20 (!) 22 18 18   Temp:      TempSrc:  SpO2:  100% 100% 100%   General: 68 y.o. female resting in bed in NAD Eyes: PERRL, normal sclera ENMT: Nares patent w/o discharge, orophaynx clear, dentition normal, ears w/o discharge/lesions/ulcers Cardiovascular: RRR, +S1, S2, no m/g/r, equal pulses throughout Respiratory: scattered soft wheeze, normal WOB GI: BS+, NDNT, no masses noted, no organomegaly noted MSK: No e/c/c Skin: No rashes, bruises, ulcerations noted Neuro: A&O x 3, no focal deficits Psyc: Appropriate interaction and affect, calm/cooperative    Labs on Admission: I have personally reviewed following labs and imaging studies  CBC: Recent Labs  Lab 07/25/19 0957  WBC 7.2  NEUTROABS 3.0  HGB 11.8*  HCT 35.3*  MCV 86.5  PLT 941   Basic Metabolic Panel: Recent Labs  Lab 07/25/19 0957  NA 145  K 4.3  CL 114*  CO2 22  GLUCOSE 104*  BUN 16  CREATININE 1.93*  CALCIUM 8.0*   GFR: Estimated Creatinine Clearance: 32.9 mL/min (A) (by C-G formula based on SCr of 1.93 mg/dL (H)). Liver Function Tests: Recent Labs  Lab 07/25/19 0957  AST 192*  ALT 91*  ALKPHOS 118  BILITOT 0.8  PROT 5.5*  ALBUMIN 2.0*   Recent Labs  Lab 07/25/19 0957  LIPASE 14   No results for input(s): AMMONIA in the last 168 hours. Coagulation Profile: No results for input(s): INR, PROTIME in the last 168 hours. Cardiac Enzymes: No results for input(s): CKTOTAL, CKMB, CKMBINDEX, TROPONINI in the last 168 hours. BNP (last 3 results) No results for input(s): PROBNP in the last 8760 hours. HbA1C: No results for input(s): HGBA1C in the last 72 hours. CBG: No results for input(s): GLUCAP in the last 168 hours. Lipid Profile: No results for input(s): CHOL, HDL, LDLCALC, TRIG, CHOLHDL, LDLDIRECT in the last 72 hours. Thyroid Function Tests: No results for input(s): TSH, T4TOTAL, FREET4, T3FREE, THYROIDAB in the last 72 hours. Anemia Panel: No results  for input(s): VITAMINB12, FOLATE, FERRITIN, TIBC, IRON, RETICCTPCT in the last 72 hours. Urine analysis:    Component Value Date/Time   COLORURINE YELLOW 05/14/2019 [place] 05/14/2019 1422   LABSPEC 1.008 05/14/2019 1422   LABSPEC 1.015 04/06/2015 0939   PHURINE 6.0 05/14/2019 1422   GLUCOSEU NEGATIVE 05/14/2019 1422   HGBUR NEGATIVE 05/14/2019 1422   BILIRUBINUR NEGATIVE 05/14/2019 1422   BILIRUBINUR 1+ 10/01/2018 1146   KETONESUR NEGATIVE 05/14/2019 1422   PROTEINUR NEGATIVE 05/14/2019 1422   UROBILINOGEN 0.2 10/01/2018 1146   UROBILINOGEN 0.2 04/06/2015 0939   NITRITE NEGATIVE 05/14/2019 1422   LEUKOCYTESUR NEGATIVE 05/14/2019 1422    Radiological Exams on Admission: DG Chest Port 1 View  Result Date: 07/25/2019 CLINICAL DATA:  Fever, chills EXAM: PORTABLE CHEST 1 VIEW COMPARISON:  05/14/2019 FINDINGS: Right Port-A-Cath in place, unchanged. Heart and mediastinal contours are within normal limits. No focal opacities or effusions. No acute bony abnormality. IMPRESSION: No active disease. Electronically Signed   By: Rolm Baptise M.D.   On: 07/25/2019 11:44    EKG: Independently reviewed. NSR  Assessment/Plan Principal Problem:   COVID-19 virus infection Active Problems:   Type 2 diabetes with nephropathy (HCC)   CKD (chronic kidney disease) stage 3, GFR 30-59 ml/min (HCC)   Hypotension   AKI (acute kidney injury) (HCC)   Elevated LFTs    COVID-19 infection Dyspnea Cough      - inflammatory markers are elevated but she is on RA      - start steroids, but will hold remdes, actemra for now      -  trend inflammatory markers, start COVID 19 order set      - will place inpatient med, surg      - check VQ scan d/t elevated d-dimer  AKI on CKD3b     - baseline Scr seems to be around 1.4     - fluids     - watch nephrotoxins     - follow up labs  Elevated LFTs     - check hepatitis panel     - check ab [place]     - check EtOH level  DM2     - carb  modified diet     - SSI     - A1c  Hypotension     - related to above?     - fluids     - consider cortisol check  DVT prophylaxis: heparin  Code Status: FULL  Family Communication: None at bedside  Disposition Plan: TBD  Consults called: None  Admission status: Inpatient. Ongoing need for IV fluids, IV steroids    Darianna Amy A Coury Grieger DO [facility]  If 7PM-7AM, please contact night-coverage [email or web address]  07/25/2019, 4:48 PM

## 2019-07-25 NOTE — ED Notes (Addendum)
Daughter brought patient things to the front of ED. RN gave belongings to patient at this time.   Patient was also given, sandwich, applesauce, crackers & PB.

## 2019-07-25 NOTE — ED Notes (Signed)
ED TO INPATIENT HANDOFF REPORT  Name/Age/Gender [name] 68 y.o. female  Code Status Code Status History    Date Active Date Inactive Code Status Order ID Comments User Context   05/13/2019 0808 05/16/2019 1738 Full Code [id number]  Ivor Costa, MD ED   03/22/2019 1943 03/24/2019 1439 Full Code [id number]  Elwyn Reach, MD Inpatient   08/27/2018 2339 08/30/2018 1636 Full Code [id number]  Leonie Man, MD Inpatient   11/02/2017 1911 11/05/2017 1725 Full Code [id number]  Elwyn Reach, MD Inpatient   03/24/2016 0231 03/25/2016 1943 Full Code [id number]  Edwin Dada, MD Inpatient   02/03/2016 2316 02/04/2016 2137 Full Code [id number]  Reubin Milan, MD Inpatient   01/14/2016 2348 01/20/2016 1723 Full Code [id number]  Karmen Bongo, MD Inpatient   12/09/2013 1738 12/10/2013 0101 Full Code [id number]  [facility]   Advance Care Planning Activity      Home/SNF/Other Home  Chief Complaint COVID-19 [U07.1]  Level of Care/Admitting Diagnosis ED Disposition    ED Disposition Condition [facility]: [facility] [[id number]]  Level of Care: Med-Surg [16]  Covid Evaluation: Confirmed COVID Positive  Diagnosis: COVID-19 [[id number]]  Admitting Physician: Jonnie Finner [[id number]]  Attending Physician: Jonnie Finner [[id number]]  Estimated length of stay: past midnight tomorrow  Certification:: I certify this patient will need inpatient services for at least 2 midnights       Medical History Past Medical History:  Diagnosis Date  . Asthma   . Degenerative joint disease   . Diabetes mellitus   . Dyspnea   . GERD (gastroesophageal reflux disease)   . Hyperlipidemia    no longer on medication for this  . Hypertension    patient denies  . Myocardial infarction ([place]) 08/2018  . Neuropathy    bilateral hands and feet  . Sickle cell trait ([place])   . Sleep apnea   . stomach ca dx'd 2010   chemo/xrt comp 05/2009  . TIA  (transient ischemic attack) 07/2015    Allergies Allergies  Allergen Reactions  . Adhesive [Tape] Other (See Comments)    Burn Skin  . Codeine Other (See Comments)    paranoid  . Zocor [Simvastatin - High Dose] Other (See Comments)    Muscle spasms  . Penicillins Rash    Has patient had a PCN reaction causing immediate rash, facial/tongue/throat swelling, SOB or lightheadedness with hypotension: Yes Has patient had a PCN reaction causing severe rash involving mucus membranes or skin necrosis: No Has patient had a PCN reaction that required hospitalization does not remember.  Has patient had a PCN reaction occurring within the last 10 years: childhood If all of the above answers are "NO", then may proceed with Cephalosporin use.     IV Location/Drains/Wounds Patient Lines/Drains/Airways Status   Active Line/Drains/Airways    Name:   Placement date:   Placement time:   Site:   Days:   Implanted Port Right Chest   --    --    Chest      Implanted Port Right Chest   --    --    Chest      Peripheral IV 07/25/19 Left Antecubital   07/25/19    0946    Antecubital   less than 1   External Urinary Catheter   05/13/19    2200    --   73   Wound / Incision (Open or Dehisced)  03/22/19 Other (Comment) Knee Left;Anterior scrab   03/22/19    1958    Knee   125          Labs/Imaging Results for orders placed or performed during the hospital encounter of 07/25/19 (from the past 48 hour(s))  CBC with Differential     Status: Abnormal   Collection Time: 07/25/19  9:57 AM  Result Value Ref Range   WBC 7.2 4.0 - 10.5 K/uL   RBC 4.08 3.87 - 5.11 MIL/uL   Hemoglobin 11.8 (L) 12.0 - 15.0 g/dL   HCT 35.3 (L) 36.0 - 46.0 %   MCV 86.5 80.0 - 100.0 fL   MCH 28.9 26.0 - 34.0 pg   MCHC 33.4 30.0 - 36.0 g/dL   RDW 16.7 (H) 11.5 - 15.5 %   Platelets 189 150 - 400 K/uL   nRBC 0.3 (H) 0.0 - 0.2 %   Neutrophils Relative % 41 %   Neutro Abs 3.0 1.7 - 7.7 K/uL   Lymphocytes Relative 40 %   Lymphs  Abs 2.9 0.7 - 4.0 K/uL   Monocytes Relative 10 %   Monocytes Absolute 0.7 0.1 - 1.0 K/uL   Eosinophils Relative 8 %   Eosinophils Absolute 0.6 (H) 0.0 - 0.5 K/uL   Basophils Relative 1 %   Basophils Absolute 0.1 0.0 - 0.1 K/uL   Immature Granulocytes 0 %   Abs Immature Granulocytes 0.03 0.00 - 0.07 K/uL    Comment: Performed at [facility], [place] [address] [place], [place] [address]  Comprehensive metabolic panel     Status: Abnormal   Collection Time: 07/25/19  9:57 AM  Result Value Ref Range   Sodium 145 135 - 145 mmol/L   Potassium 4.3 3.5 - 5.1 mmol/L   Chloride 114 (H) 98 - 111 mmol/L   CO2 22 22 - 32 mmol/L   Glucose, Bld 104 (H) 70 - 99 mg/dL   BUN 16 8 - 23 mg/dL   Creatinine, Ser 1.93 (H) 0.44 - 1.00 mg/dL   Calcium 8.0 (L) 8.9 - 10.3 mg/dL   Total Protein 5.5 (L) 6.5 - 8.1 g/dL   Albumin 2.0 (L) 3.5 - 5.0 g/dL   AST 192 (H) 15 - 41 U/L   ALT 91 (H) 0 - 44 U/L   Alkaline Phosphatase 118 38 - 126 U/L   Total Bilirubin 0.8 0.3 - 1.2 mg/dL   GFR calc non Af Amer 26 (L) >60 mL/min   GFR calc Af Amer 30 (L) >60 mL/min   Anion gap 9 5 - 15    Comment: Performed at [facility], [place] [address], [place], [place] [address]  Lipase, blood     Status: None   Collection Time: 07/25/19  9:57 AM  Result Value Ref Range   Lipase 14 11 - 51 U/L    Comment: Performed at [facility], [place] [address], [place], [place] [address]  POC SARS Coronavirus 2 Ag-ED - Nasal Swab (BD Veritor Kit)     Status: Abnormal   Collection Time: 07/25/19 11:22 AM  Result Value Ref Range   SARS Coronavirus 2 Ag POSITIVE (A) NEGATIVE    Comment: (NOTE) SARS-CoV-2 antigen NOT DETECTED.  Negative results are presumptive.  Negative results do not preclude SARS-CoV-2 infection and should not be used as the sole basis for treatment or other patient management decisions, including infection  control decisions, particularly in the presence of clinical  signs and  symptoms consistent  with COVID-19, or in those who have been in contact with the virus.  Negative results must be combined with clinical observations, patient history, and epidemiological information. The expected result is Negative. Fact Sheet for Patients: PodPark.tn Fact Sheet for Healthcare Providers: GiftContent.is This test is not yet approved or cleared by the [place] FDA and  has been authorized for detection and/or diagnosis of SARS-CoV-2 by FDA under an Emergency Use Authorization (EUA).  This EUA will remain in effect (meaning this test can be used) for the duration of  the COVID-19 de claration under Section 564(b)(1) of the Act, 21 U.S.C. section 360bbb-3(b)(1), unless the authorization is terminated or revoked sooner.   C-reactive protein     Status: None   Collection Time: 07/25/19  4:00 PM  Result Value Ref Range   CRP 0.9 <1.0 mg/dL    Comment: Performed at [facility], [place] [address], [place], [place] [address]  Ferritin     Status: Abnormal   Collection Time: 07/25/19  4:00 PM  Result Value Ref Range   Ferritin 468 (H) 11 - 307 ng/mL    Comment: Performed at [facility], [place] [address], [place], [place] [address]  D-dimer, quantitative (not at [facility])     Status: Abnormal   Collection Time: 07/25/19  4:00 PM  Result Value Ref Range   D-Dimer, Quant 2.49 (H) 0.00 - 0.50 ug/mL-FEU    Comment: (NOTE) At the manufacturer cut-off of 0.50 ug/mL FEU, this assay has been documented to exclude PE with a sensitivity and negative predictive value of 97 to 99%.  At this time, this assay has not been approved by the FDA to exclude DVT/VTE. Results should be correlated with clinical presentation. Performed at [facility], [place] [address] [place], [place] [address]   Lactate dehydrogenase     Status: Abnormal   Collection Time: 07/25/19  4:00  PM  Result Value Ref Range   LDH 292 (H) 98 - 192 U/L    Comment: Performed at [facility], [place] [address], [place], [place] [address]   DG Chest Port 1 View  Result Date: 07/25/2019 CLINICAL DATA:  Fever, chills EXAM: PORTABLE CHEST 1 VIEW COMPARISON:  05/14/2019 FINDINGS: Right Port-A-Cath in place, unchanged. Heart and mediastinal contours are within normal limits. No focal opacities or effusions. No acute bony abnormality. IMPRESSION: No active disease. Electronically Signed   By: Rolm Baptise M.D.   On: 07/25/2019 11:44    Pending Labs Unresulted Labs (From admission, onward)    Start     Ordered   07/26/19 0500  Hepatitis panel, acute  Tomorrow morning,   R     07/25/19 1703   07/25/19 1704  Ethanol  Once,   STAT     07/25/19 1703   07/25/19 0949  Urinalysis, Routine w reflex microscopic  ONCE - STAT,   STAT     07/25/19 0950   Signed and Held  ABO/Rh  Once,   R     Signed and Held   Signed and Held  CBC  (heparin)  Once,   R    Comments: Baseline for heparin therapy IF NOT ALREADY DRAWN.  Notify MD if PLT < 100 K.    Signed and Held   Signed and Held  Creatinine, serum  (heparin)  Once,   R    Comments: Baseline for heparin therapy IF NOT ALREADY DRAWN.    Signed and Held   Signed  and Held  CBC with Differential/Platelet  Daily,   R     Signed and Held   Signed and Held  Comprehensive metabolic panel  Daily,   R     Signed and Held   Signed and Held  C-reactive protein  Daily,   R     Signed and Held   Signed and Held  D-dimer, quantitative (not at [facility])  Daily,   R     Signed and Held   Signed and Held  Ferritin  Daily,   R     Signed and Held   Signed and Held  Magnesium  Daily,   R     Signed and Held   Signed and Held  Phosphorus  Daily,   R     Signed and Held          Vitals/Pain Today's Vitals   07/25/19 1500 07/25/19 1520 07/25/19 1941 07/25/19 2130  BP: (!) 103/56 92/66 97/71  93/65  Pulse: 74 75 71 76  Resp: 18 18 19 14   Temp:       TempSrc:      SpO2: 100% 100% 97% 96%  PainSc:        Isolation Precautions Airborne and Contact precautions  Medications Medications  sodium chloride 0.9 % bolus 1,000 mL (0 mLs Intravenous Stopped 07/25/19 1903)    Mobility walks

## 2019-07-25 NOTE — ED Notes (Signed)
Patient informed RN that she has a port in R chest but asked RN not to use it without EMLA cream. Due to patient's decreasing BP, RN put in peripheral IV for fluids. Patient put own EMLA cream on port site if it needs to be accessed in the future.

## 2019-07-25 NOTE — ED Notes (Signed)
Bedside commode placed at bedside.

## 2019-07-26 DIAGNOSIS — I2510 Atherosclerotic heart disease of native coronary artery without angina pectoris: Secondary | ICD-10-CM | POA: Diagnosis not present

## 2019-07-26 DIAGNOSIS — Z809 Family history of malignant neoplasm, unspecified: Secondary | ICD-10-CM | POA: Diagnosis not present

## 2019-07-26 DIAGNOSIS — U071 COVID-19: Secondary | ICD-10-CM | POA: Diagnosis not present

## 2019-07-26 DIAGNOSIS — R531 Weakness: Secondary | ICD-10-CM | POA: Diagnosis not present

## 2019-07-26 DIAGNOSIS — E559 Vitamin D deficiency, unspecified: Secondary | ICD-10-CM | POA: Diagnosis not present

## 2019-07-26 DIAGNOSIS — E1122 Type 2 diabetes mellitus with diabetic chronic kidney disease: Secondary | ICD-10-CM | POA: Diagnosis not present

## 2019-07-26 DIAGNOSIS — Z833 Family history of diabetes mellitus: Secondary | ICD-10-CM | POA: Diagnosis not present

## 2019-07-26 DIAGNOSIS — I252 Old myocardial infarction: Secondary | ICD-10-CM | POA: Diagnosis not present

## 2019-07-26 DIAGNOSIS — Z955 Presence of coronary angioplasty implant and graft: Secondary | ICD-10-CM | POA: Diagnosis not present

## 2019-07-26 DIAGNOSIS — E1169 Type 2 diabetes mellitus with other specified complication: Secondary | ICD-10-CM | POA: Diagnosis not present

## 2019-07-26 DIAGNOSIS — N179 Acute kidney failure, unspecified: Secondary | ICD-10-CM | POA: Diagnosis not present

## 2019-07-26 DIAGNOSIS — K219 Gastro-esophageal reflux disease without esophagitis: Secondary | ICD-10-CM | POA: Diagnosis not present

## 2019-07-26 DIAGNOSIS — E1140 Type 2 diabetes mellitus with diabetic neuropathy, unspecified: Secondary | ICD-10-CM | POA: Diagnosis not present

## 2019-07-26 DIAGNOSIS — I82451 Acute embolism and thrombosis of right peroneal vein: Secondary | ICD-10-CM | POA: Diagnosis not present

## 2019-07-26 DIAGNOSIS — Z96652 Presence of left artificial knee joint: Secondary | ICD-10-CM | POA: Diagnosis not present

## 2019-07-26 DIAGNOSIS — N1832 Chronic kidney disease, stage 3b: Secondary | ICD-10-CM | POA: Diagnosis not present

## 2019-07-26 DIAGNOSIS — G4730 Sleep apnea, unspecified: Secondary | ICD-10-CM | POA: Diagnosis not present

## 2019-07-26 DIAGNOSIS — R05 Cough: Secondary | ICD-10-CM | POA: Diagnosis not present

## 2019-07-26 DIAGNOSIS — M1990 Unspecified osteoarthritis, unspecified site: Secondary | ICD-10-CM | POA: Diagnosis not present

## 2019-07-26 DIAGNOSIS — Z820 Family history of epilepsy and other diseases of the nervous system: Secondary | ICD-10-CM | POA: Diagnosis not present

## 2019-07-26 DIAGNOSIS — Z96641 Presence of right artificial hip joint: Secondary | ICD-10-CM | POA: Diagnosis not present

## 2019-07-26 DIAGNOSIS — G894 Chronic pain syndrome: Secondary | ICD-10-CM | POA: Diagnosis not present

## 2019-07-26 DIAGNOSIS — E785 Hyperlipidemia, unspecified: Secondary | ICD-10-CM | POA: Diagnosis not present

## 2019-07-26 DIAGNOSIS — R0902 Hypoxemia: Secondary | ICD-10-CM | POA: Diagnosis not present

## 2019-07-26 DIAGNOSIS — I129 Hypertensive chronic kidney disease with stage 1 through stage 4 chronic kidney disease, or unspecified chronic kidney disease: Secondary | ICD-10-CM | POA: Diagnosis not present

## 2019-07-26 DIAGNOSIS — Z8673 Personal history of transient ischemic attack (TIA), and cerebral infarction without residual deficits: Secondary | ICD-10-CM | POA: Diagnosis not present

## 2019-07-26 DIAGNOSIS — D573 Sickle-cell trait: Secondary | ICD-10-CM | POA: Diagnosis not present

## 2019-07-26 LAB — URINALYSIS, ROUTINE W REFLEX MICROSCOPIC
Bilirubin Urine: NEGATIVE
Glucose, UA: NEGATIVE mg/dL
Ketones, ur: NEGATIVE mg/dL
Leukocytes,Ua: NEGATIVE
Nitrite: NEGATIVE
Protein, ur: NEGATIVE mg/dL
Specific Gravity, Urine: 1.006 (ref 1.005–1.030)
pH: 5 (ref 5.0–8.0)

## 2019-07-26 LAB — GLUCOSE, CAPILLARY: Glucose-Capillary: 174 mg/dL — ABNORMAL HIGH (ref 70–99)

## 2019-07-26 LAB — CREATININE, SERUM
Creatinine, Ser: 1.63 mg/dL — ABNORMAL HIGH (ref 0.44–1.00)
GFR calc Af Amer: 37 mL/min — ABNORMAL LOW (ref >60)
GFR calc non Af Amer: 32 mL/min — ABNORMAL LOW (ref >60)

## 2019-07-26 LAB — CBC
HCT: 28 % — ABNORMAL LOW (ref 36.0–46.0)
Hemoglobin: 9.6 g/dL — ABNORMAL LOW (ref 12.0–15.0)
MCH: 28.9 pg (ref 26.0–34.0)
MCHC: 34.3 g/dL (ref 30.0–36.0)
MCV: 84.3 fL (ref 80.0–100.0)
Platelets: 171 10*3/uL (ref 150–400)
RBC: 3.32 MIL/uL — ABNORMAL LOW (ref 3.87–5.11)
RDW: 16.3 % — ABNORMAL HIGH (ref 11.5–15.5)
WBC: 7.3 10*3/uL (ref 4.0–10.5)
nRBC: 0 % (ref 0.0–0.2)

## 2019-07-26 LAB — HEPATITIS PANEL, ACUTE
HCV Ab: NONREACTIVE
Hep A IgM: NONREACTIVE
Hep B C IgM: NONREACTIVE
Hepatitis B Surface Ag: NONREACTIVE

## 2019-07-26 LAB — CBG MONITORING, ED: Glucose-Capillary: 174 mg/dL — ABNORMAL HIGH (ref 70–99)

## 2019-07-26 LAB — NOVEL CORONAVIRUS, NAA: SARS-CoV-2, NAA: DETECTED — AB

## 2019-07-26 LAB — ETHANOL: Alcohol, Ethyl (B): <10 mg/dL (ref <10)

## 2019-07-26 MED ORDER — DEXAMETHASONE SODIUM PHOSPHATE 10 MG/ML IJ SOLN
6.0000 mg | INTRAMUSCULAR | Status: DC
  Administered 2019-07-26 – 2019-07-28 (×3): 6 mg via INTRAVENOUS
  Filled 2019-07-26 (×3): qty 1

## 2019-07-26 MED ORDER — DOCUSATE SODIUM 100 MG PO CAPS
100.0000 mg | ORAL_CAPSULE | Freq: Two times a day (BID) | ORAL | Status: DC
  Administered 2019-07-26 – 2019-07-29 (×8): 100 mg via ORAL
  Filled 2019-07-26 (×8): qty 1

## 2019-07-26 MED ORDER — ALBUMIN HUMAN 25 % IV SOLN
25.0000 g | Freq: Once | INTRAVENOUS | Status: AC
  Administered 2019-07-26 (×2): 12.5 g via INTRAVENOUS
  Filled 2019-07-26: qty 100

## 2019-07-26 MED ORDER — ACETAMINOPHEN 325 MG PO TABS: 650.0000 mg | ORAL_TABLET | Freq: Four times a day (QID) | ORAL | Status: DC | PRN

## 2019-07-26 MED ORDER — SODIUM CHLORIDE 0.9 % IV BOLUS
1000.0000 mL | Freq: Once | INTRAVENOUS | Status: AC
  Administered 2019-07-26: 1000 mL via INTRAVENOUS

## 2019-07-26 MED ORDER — SUCRALFATE 1 G PO TABS
1.0000 g | ORAL_TABLET | Freq: Two times a day (BID) | ORAL | Status: DC
  Administered 2019-07-26 – 2019-07-29 (×9): 1 g via ORAL
  Filled 2019-07-26 (×10): qty 1

## 2019-07-26 MED ORDER — GUAIFENESIN-DM 100-10 MG/5ML PO SYRP
10.0000 mL | ORAL_SOLUTION | ORAL | Status: DC | PRN
  Administered 2019-07-29: 10 mL via ORAL
  Filled 2019-07-26: qty 10

## 2019-07-26 MED ORDER — ASCORBIC ACID 500 MG PO TABS
500.0000 mg | ORAL_TABLET | Freq: Every day | ORAL | Status: DC
  Administered 2019-07-26 – 2019-07-29 (×4): 500 mg via ORAL
  Filled 2019-07-26 (×4): qty 1

## 2019-07-26 MED ORDER — INSULIN ASPART 100 UNIT/ML ~~LOC~~ SOLN
0.0000 [IU] | Freq: Three times a day (TID) | SUBCUTANEOUS | Status: DC
  Administered 2019-07-27 – 2019-07-28 (×5): 2 [IU] via SUBCUTANEOUS
  Administered 2019-07-29: 0 [IU] via SUBCUTANEOUS
  Administered 2019-07-29: 1 [IU] via SUBCUTANEOUS
  Filled 2019-07-26: qty 0.09

## 2019-07-26 MED ORDER — ZINC SULFATE 220 (50 ZN) MG PO CAPS
220.0000 mg | ORAL_CAPSULE | Freq: Every day | ORAL | Status: DC
  Administered 2019-07-26 – 2019-07-29 (×4): 220 mg via ORAL
  Filled 2019-07-26 (×4): qty 1

## 2019-07-26 MED ORDER — PANTOPRAZOLE SODIUM 40 MG PO TBEC
40.0000 mg | DELAYED_RELEASE_TABLET | Freq: Every day | ORAL | Status: DC
  Administered 2019-07-26 – 2019-07-29 (×4): 40 mg via ORAL
  Filled 2019-07-26 (×3): qty 1

## 2019-07-26 MED ORDER — CHLORHEXIDINE GLUCONATE CLOTH 2 % EX PADS
6.0000 | MEDICATED_PAD | Freq: Every day | CUTANEOUS | Status: DC
  Administered 2019-07-26 – 2019-07-29 (×4): 6 via TOPICAL

## 2019-07-26 MED ORDER — SODIUM CHLORIDE 0.9 % IV SOLN: INTRAVENOUS | Status: DC

## 2019-07-26 MED ORDER — INSULIN ASPART 100 UNIT/ML ~~LOC~~ SOLN
0.0000 [IU] | Freq: Every day | SUBCUTANEOUS | Status: DC
  Filled 2019-07-26: qty 0.05

## 2019-07-26 MED ORDER — ASPIRIN EC 81 MG PO TBEC
81.0000 mg | DELAYED_RELEASE_TABLET | Freq: Every day | ORAL | Status: DC
  Administered 2019-07-26 – 2019-07-28 (×3): 81 mg via ORAL
  Filled 2019-07-26 (×3): qty 1

## 2019-07-26 MED ORDER — POLYETHYLENE GLYCOL 3350 17 G PO PACK: 17.0000 g | PACK | Freq: Every day | ORAL | Status: DC | PRN

## 2019-07-26 MED ORDER — ROSUVASTATIN CALCIUM 20 MG PO TABS
40.0000 mg | ORAL_TABLET | Freq: Every day | ORAL | Status: DC
  Administered 2019-07-26 – 2019-07-29 (×4): 40 mg via ORAL
  Filled 2019-07-26: qty 1
  Filled 2019-07-26 (×4): qty 2

## 2019-07-26 MED ORDER — CLOPIDOGREL BISULFATE 75 MG PO TABS
75.0000 mg | ORAL_TABLET | Freq: Every day | ORAL | Status: DC
  Administered 2019-07-26 – 2019-07-29 (×4): 75 mg via ORAL
  Filled 2019-07-26 (×4): qty 1

## 2019-07-26 MED ORDER — HEPARIN SODIUM (PORCINE) 5000 UNIT/ML IJ SOLN
5000.0000 [IU] | Freq: Three times a day (TID) | INTRAMUSCULAR | Status: DC
  Administered 2019-07-26 – 2019-07-28 (×7): 5000 [IU] via SUBCUTANEOUS
  Filled 2019-07-26 (×6): qty 1

## 2019-07-26 MED ORDER — DARIFENACIN HYDROBROMIDE ER 7.5 MG PO TB24
7.5000 mg | ORAL_TABLET | Freq: Every day | ORAL | Status: DC
  Administered 2019-07-26 – 2019-07-29 (×4): 7.5 mg via ORAL
  Filled 2019-07-26 (×5): qty 1

## 2019-07-26 MED ORDER — PROMETHAZINE HCL 25 MG PO TABS: 12.5000 mg | ORAL_TABLET | Freq: Four times a day (QID) | ORAL | Status: DC | PRN

## 2019-07-26 MED ORDER — NYSTATIN 100000 UNIT/ML MT SUSP
5.0000 mL | Freq: Four times a day (QID) | OROMUCOSAL | Status: DC
  Administered 2019-07-26 – 2019-07-29 (×15): 500000 [IU] via OROMUCOSAL
  Filled 2019-07-26 (×18): qty 5

## 2019-07-26 MED ORDER — SERTRALINE HCL 50 MG PO TABS
50.0000 mg | ORAL_TABLET | Freq: Every day | ORAL | Status: DC
  Administered 2019-07-26 – 2019-07-29 (×4): 50 mg via ORAL
  Filled 2019-07-26 (×4): qty 1

## 2019-07-26 MED ORDER — TRAMADOL HCL 50 MG PO TABS
100.0000 mg | ORAL_TABLET | Freq: Three times a day (TID) | ORAL | Status: DC | PRN
  Administered 2019-07-26 – 2019-07-29 (×5): 100 mg via ORAL
  Filled 2019-07-26 (×5): qty 2

## 2019-07-26 NOTE — Progress Notes (Signed)
PROGRESS NOTE    [name]  KPT:[id number] DOB: Apr 07, 1951 DOA: 07/25/2019 PCP: Ann Held, DO    Brief Narrative:   [name] is a 69 y.o. female with past medical history significant for DM2, HLD, Asthma who presented to the ED for progressive cough, dyspnea over the past week. OTCs had not helped and it seemed out of the ordinary for her. She denies any aggravating factors. She had a video visit with her PCP. At that time she was prescribed a z-pack. She had just about finished it up but noticed that she had no real improvement in her symptoms.  ED Course: CXR showed no disease. She was somewhat hypotensive. Found to be in AKI and COVID+. TRH was called for admission.    Assessment & Plan:   Principal Problem:   COVID-19 virus infection Active Problems:   Type 2 diabetes with nephropathy (HCC)   CKD (chronic kidney disease) stage 3, GFR 30-59 ml/min (HCC)   Hypotension   AKI (acute kidney injury) ([place])   Elevated LFTs   COVID-19   Covid-19 viral infection during the ongoing 2020 Covid 19 Pandemic - POA Patient presenting to ED with complaint of progressive dyspnea, cough; which was not improved with outpatient antibiotics with azithromycin prescribed by her PCP.  Patient was afebrile without leukocytosis.  Covid-19 test was positive on 07/25/2019.  LDH 292, ferritin 468, CRP 0.9, D-dimer 2.4.  Chest x-ray with no active disease process. --Remdesivir and Actemra not indicated given no hypoxia and CRP <7.0 --Continue Decadron 6 mg p.o. daily --Continue to monitor respiratory status closely, currently oxygenating well on room air --Continue supportive care with vitamin C, zinc, Tylenol --Follow CBC, CMP, D-dimer, ferritin, and CRP daily --Continue airborne/contact isolation precautions  AKI on CKD stage IIIb Patient presenting with elevated creatinine of 1.93 with a baseline roughly 1.4.  Etiology likely secondary to decreased oral intake, dehydration; from  underlying Covid-19 viral infection. --Cr 1.93-->1.63 --Continue IVF with NS at 75 mL's per hour --Avoid nephrotoxins, renally dose all medications --Follow renal function daily  Elevated LFTs AST and ALT elevated at 192 and 91 respectively.  EtOH level less than 10.  Bilirubin within normal limits.  Acute hepatitis panel negative.  Suspect etiology related to hepatic impairment from underlying Covid-19 versus possible suspicion for EtOH abuse given AST/ALT ratio. --Avoid hepatotoxins; discontinue Tylenol --Follow CMP daily  Hypotension: Resolved On initial presentation, BP 62/52, likely secondary to dehydration.  Responded well to IV fluid hydration.  Blood pressure now up to 123/78 today. --Continue monitor blood pressure closely  Type 2 diabetes mellitus Last hemoglobin A1c 10.0 on 05/13/2019.  Home regimen includes Metformin 500 mg BID. --Check hemoglobin A1c --Insulin sliding scale for coverage --CBGs q4h qAC/HS   DVT prophylaxis: Heparin Code Status: Full code Family Communication: None present at bedside Disposition Plan: Continue inpatient, IV fluid hydration, closely monitor respiratory function and inflammatory markers, PT evaluation, currently lives alone.  Disposition pending therapy evaluation.   Consultants:   None  Procedures:   None  Antimicrobials:   None   Subjective: Patient seen and examined at bedside earlier this morning, awaiting in ED holding area.  Requesting restart of her home on tramadol for diffuse body aches/generalized pain.  Patient reports dyspnea improved.  Currently oxygenating well on room air.  Reports good urine output.  No other complaints or concerns at this time.  Denies headache, no visual changes, no chest pain, no palpitations, no abdominal pain, no fatigue, no  paresthesias.  No acute events overnight per nursing staff.  Objective: Vitals:   07/26/19 1200 07/26/19 1230 07/26/19 1300 07/26/19 1330  BP: 101/67 114/67 114/68  123/78  Pulse: 67 72 71 63  Resp: 19 19 19  (!) 22  Temp:      TempSrc:      SpO2: 96% 98% 96% 99%   No intake or output data in the 24 hours ending 07/26/19 1406 There were no vitals filed for this visit.  Examination:  General exam: Appears calm and comfortable  Respiratory system: Clear to auscultation. Respiratory effort normal.  Oxygenating well on room air Cardiovascular system: S1 & S2 heard, RRR. No JVD, murmurs, rubs, gallops or clicks. No pedal edema. Gastrointestinal system: Abdomen is nondistended, soft and nontender. No organomegaly or masses felt. Normal bowel sounds heard. Central nervous system: Alert and oriented. No focal neurological deficits. Extremities: Symmetric 5 x 5 power. Skin: No rashes, lesions or ulcers Psychiatry: Judgement and insight appear normal. Mood & affect appropriate.     Data Reviewed: I have personally reviewed following labs and imaging studies  CBC: Recent Labs  Lab 07/25/19 0957 07/26/19 0440  WBC 7.2 7.3  NEUTROABS 3.0  --   HGB 11.8* 9.6*  HCT 35.3* 28.0*  MCV 86.5 84.3  PLT 189 854   Basic Metabolic Panel: Recent Labs  Lab 07/25/19 0957 07/26/19 0440  NA 145  --   K 4.3  --   CL 114*  --   CO2 22  --   GLUCOSE 104*  --   BUN 16  --   CREATININE 1.93* 1.63*  CALCIUM 8.0*  --    GFR: Estimated Creatinine Clearance: 39 mL/min (A) (by C-G formula based on SCr of 1.63 mg/dL (H)). Liver Function Tests: Recent Labs  Lab 07/25/19 0957  AST 192*  ALT 91*  ALKPHOS 118  BILITOT 0.8  PROT 5.5*  ALBUMIN 2.0*   Recent Labs  Lab 07/25/19 0957  LIPASE 14   No results for input(s): AMMONIA in the last 168 hours. Coagulation Profile: No results for input(s): INR, PROTIME in the last 168 hours. Cardiac Enzymes: No results for input(s): CKTOTAL, CKMB, CKMBINDEX, TROPONINI in the last 168 hours. BNP (last 3 results) No results for input(s): PROBNP in the last 8760 hours. HbA1C: No results for input(s): HGBA1C in the  last 72 hours. CBG: No results for input(s): GLUCAP in the last 168 hours. Lipid Profile: No results for input(s): CHOL, HDL, LDLCALC, TRIG, CHOLHDL, LDLDIRECT in the last 72 hours. Thyroid Function Tests: No results for input(s): TSH, T4TOTAL, FREET4, T3FREE, THYROIDAB in the last 72 hours. Anemia Panel: Recent Labs    07/25/19 1600  FERRITIN 468*   Sepsis Labs: No results for input(s): PROCALCITON, LATICACIDVEN in the last 168 hours.  No results found for this or any previous visit (from the past 240 hour(s)).       Radiology Studies: DG Chest Port 1 View  Result Date: 07/25/2019 CLINICAL DATA:  Fever, chills EXAM: PORTABLE CHEST 1 VIEW COMPARISON:  05/14/2019 FINDINGS: Right Port-A-Cath in place, unchanged. Heart and mediastinal contours are within normal limits. No focal opacities or effusions. No acute bony abnormality. IMPRESSION: No active disease. Electronically Signed   By: Rolm Baptise M.D.   On: 07/25/2019 11:44        Scheduled Meds: . vitamin C  500 mg Oral Daily  . aspirin EC  81 mg Oral Daily  . clopidogrel  75 mg Oral Daily  . darifenacin  7.5 mg Oral Daily  . dexamethasone (DECADRON) injection  6 mg Intravenous Q24H  . docusate sodium  100 mg Oral BID  . heparin  5,000 Units Subcutaneous Q8H  . nystatin  5 mL Mouth/Throat QID  . pantoprazole  40 mg Oral Daily  . rosuvastatin  40 mg Oral QHS  . sertraline  50 mg Oral Daily  . sucralfate  1 g Oral BID  . zinc sulfate  220 mg Oral Daily   Continuous Infusions: . sodium chloride 75 mL/hr at 07/26/19 0441     LOS: 1 day    Time spent: 36 minutes spent on chart review, discussion with nursing staff, consultants, updating family and interview/physical exam; more than 50% of that time was spent in counseling and/or coordination of care.    [place] Sarinana J [place] DO [facility] 07/26/2019, 2:06 PM

## 2019-07-26 NOTE — ED Notes (Signed)
Made on call PA Bodenheimer aware of pt pressures.

## 2019-07-26 NOTE — ED Notes (Signed)
ED TO INPATIENT HANDOFF REPORT  ED Nurse Name and Phone #:   S Name/Age/Gender [name] 69 y.o. female Room/Bed: WA12/WA12  Code Status   Code Status: Full Code  Home/SNF/Other Home Patient oriented to: self, place, time and situation Is this baseline? Yes   Triage Complete: Triage complete  Chief Complaint COVID-19 [U07.1]  Triage Note Pt had diarrhea for over week. Had cough, body aches, chills for several. Been taking antibiotics (z-pack) 4 days ago. Went to [facility] this am for covid testing. Reports 4 falls in past couple months.     Allergies Allergies  Allergen Reactions  . Adhesive [Tape] Other (See Comments)    Burn Skin  . Codeine Other (See Comments)    paranoid  . Zocor [Simvastatin - High Dose] Other (See Comments)    Muscle spasms  . Penicillins Rash    Has patient had a PCN reaction causing immediate rash, facial/tongue/throat swelling, SOB or lightheadedness with hypotension: Yes Has patient had a PCN reaction causing severe rash involving mucus membranes or skin necrosis: No Has patient had a PCN reaction that required hospitalization does not remember.  Has patient had a PCN reaction occurring within the last 10 years: childhood If all of the above answers are "NO", then may proceed with Cephalosporin use.     Level of Care/Admitting Diagnosis ED Disposition    ED Disposition Condition Comment   Admit  Hospital Area: [facility] [[id number]]  Level of Care: Med-Surg [16]  Covid Evaluation: Confirmed COVID Positive  Diagnosis: COVID-19 [[id number]]  Admitting Physician: Jonnie Finner [[id number]]  Attending Physician: Jonnie Finner [[id number]]  Estimated length of stay: past midnight tomorrow  Certification:: I certify this patient will need inpatient services for at least 2 midnights       B Medical/Surgery History Past Medical History:  Diagnosis Date  . Asthma   . Degenerative joint disease   . Diabetes mellitus    . Dyspnea   . GERD (gastroesophageal reflux disease)   . Hyperlipidemia    no longer on medication for this  . Hypertension    patient denies  . Myocardial infarction ([place]) 08/2018  . Neuropathy    bilateral hands and feet  . Sickle cell trait ([place])   . Sleep apnea   . stomach ca dx'd 2010   chemo/xrt comp 05/2009  . TIA (transient ischemic attack) 07/2015   Past Surgical History:  Procedure Laterality Date  . APPENDECTOMY    . CESAREAN SECTION    . COLON SURGERY     colonscopy  . COLONOSCOPY WITH PROPOFOL N/A 06/01/2016   Procedure: COLONOSCOPY WITH PROPOFOL;  Surgeon: Wilford Corner, MD;  Location: [facility] ENDOSCOPY;  Service: Endoscopy;  Laterality: N/A;  . CORONARY STENT INTERVENTION N/A 08/29/2018   Procedure: CORONARY STENT INTERVENTION;  Surgeon: Wellington Hampshire, MD;  Location: [facility] CV LAB;  Service: Cardiovascular;  Laterality: N/A;  . CORONARY/GRAFT ACUTE MI REVASCULARIZATION N/A 08/27/2018   Procedure: Coronary/Graft Acute MI Revascularization;  Surgeon: Leonie Man, MD;  Location: [facility];  Service: Cardiovascular;  Laterality: N/A;  . ESOPHAGOGASTRODUODENOSCOPY N/A 10/15/2012   Procedure: ESOPHAGOGASTRODUODENOSCOPY (EGD);  Surgeon: Lear Ng, MD;  Location: [facility] ENDOS[facility];  Service: Endoscopy;  Laterality: N/A;  . ESOPHAGOGASTRODUODENOSCOPY N/A 01/15/2016   Procedure: ESOPHAGOGASTRODUODENOSCOPY (EGD);  Surgeon: Ronald Lobo, MD;  Location: [facility] ENDOSCOPY;  Service: Endoscopy;  Laterality: N/A;  . HERNIA REPAIR    . LEFT HEART CATH AND CORONARY ANGIOGRAPHY N/A  08/27/2018   Procedure: LEFT HEART CATH AND CORONARY ANGIOGRAPHY;  Surgeon: Leonie Man, MD;  Location: [facility];  Service: Cardiovascular;  Laterality: N/A;  . SHOULDER SURGERY     Left  . TOTAL HIP ARTHROPLASTY     Right  . TOTAL KNEE ARTHROPLASTY     Left     A IV Location/Drains/Wounds Patient Lines/Drains/Airways Status   Active Line/Drains/Airways    Name:    Placement date:   Placement time:   Site:   Days:   Implanted Port Right Chest   --    --    Chest      Implanted Port Right Chest   --    --    Chest      Peripheral IV 07/25/19 Left Antecubital   07/25/19    0946    Antecubital   1   External Urinary Catheter   05/13/19    2200    --   74   Wound / Incision (Open or Dehisced) 03/22/19 Other (Comment) Knee Left;Anterior scrab   03/22/19    1958    Knee   126          Intake/Output Last 24 hours No intake or output data in the 24 hours ending 07/26/19 1604  Labs/Imaging Results for orders placed or performed during the hospital encounter of 07/25/19 (from the past 48 hour(s))  CBC with Differential     Status: Abnormal   Collection Time: 07/25/19  9:57 AM  Result Value Ref Range   WBC 7.2 4.0 - 10.5 K/uL   RBC 4.08 3.87 - 5.11 MIL/uL   Hemoglobin 11.8 (L) 12.0 - 15.0 g/dL   HCT 35.3 (L) 36.0 - 46.0 %   MCV 86.5 80.0 - 100.0 fL   MCH 28.9 26.0 - 34.0 pg   MCHC 33.4 30.0 - 36.0 g/dL   RDW 16.7 (H) 11.5 - 15.5 %   Platelets 189 150 - 400 K/uL   nRBC 0.3 (H) 0.0 - 0.2 %   Neutrophils Relative % 41 %   Neutro Abs 3.0 1.7 - 7.7 K/uL   Lymphocytes Relative 40 %   Lymphs Abs 2.9 0.7 - 4.0 K/uL   Monocytes Relative 10 %   Monocytes Absolute 0.7 0.1 - 1.0 K/uL   Eosinophils Relative 8 %   Eosinophils Absolute 0.6 (H) 0.0 - 0.5 K/uL   Basophils Relative 1 %   Basophils Absolute 0.1 0.0 - 0.1 K/uL   Immature Granulocytes 0 %   Abs Immature Granulocytes 0.03 0.00 - 0.07 K/uL    Comment: Performed at [facility], [place] [address], [place], [place] [address]  Comprehensive metabolic panel     Status: Abnormal   Collection Time: 07/25/19  9:57 AM  Result Value Ref Range   Sodium 145 135 - 145 mmol/L   Potassium 4.3 3.5 - 5.1 mmol/L   Chloride 114 (H) 98 - 111 mmol/L   CO2 22 22 - 32 mmol/L   Glucose, Bld 104 (H) 70 - 99 mg/dL   BUN 16 8 - 23 mg/dL   Creatinine, Ser 1.93 (H) 0.44 - 1.00 mg/dL   Calcium 8.0 (L) 8.9  - 10.3 mg/dL   Total Protein 5.5 (L) 6.5 - 8.1 g/dL   Albumin 2.0 (L) 3.5 - 5.0 g/dL   AST 192 (H) 15 - 41 U/L   ALT 91 (H) 0 - 44 U/L   Alkaline Phosphatase 118 38 - 126 U/L  Total Bilirubin 0.8 0.3 - 1.2 mg/dL   GFR calc non Af Amer 26 (L) >60 mL/min   GFR calc Af Amer 30 (L) >60 mL/min   Anion gap 9 5 - 15    Comment: Performed at [facility], [place] [address], [place], [place] [address]  Lipase, blood     Status: None   Collection Time: 07/25/19  9:57 AM  Result Value Ref Range   Lipase 14 11 - 51 U/L    Comment: Performed at [facility], [place] [address] [place], [place] [address]  POC SARS Coronavirus 2 Ag-ED - Nasal Swab (BD Veritor Kit)     Status: Abnormal   Collection Time: 07/25/19 11:22 AM  Result Value Ref Range   SARS Coronavirus 2 Ag POSITIVE (A) NEGATIVE    Comment: (NOTE) SARS-CoV-2 antigen NOT DETECTED.  Negative results are presumptive.  Negative results do not preclude SARS-CoV-2 infection and should not be used as the sole basis for treatment or other patient management decisions, including infection  control decisions, particularly in the presence of clinical signs and  symptoms consistent with COVID-19, or in those who have been in contact with the virus.  Negative results must be combined with clinical observations, patient history, and epidemiological information. The expected result is Negative. Fact Sheet for Patients: PodPark.tn Fact Sheet for Healthcare Providers: GiftContent.is This test is not yet approved or cleared by the [place] FDA and  has been authorized for detection and/or diagnosis of SARS-CoV-2 by FDA under an Emergency Use Authorization (EUA).  This EUA will remain in effect (meaning this test can be used) for the duration of  the COVID-19 de claration under Section 564(b)(1) of the Act, 21 U.S.C. section 360bbb-3(b)(1), unless the  authorization is terminated or revoked sooner.   C-reactive protein     Status: None   Collection Time: 07/25/19  4:00 PM  Result Value Ref Range   CRP 0.9 <1.0 mg/dL    Comment: Performed at [facility], [place] [address], [place], [place] [address]  Ferritin     Status: Abnormal   Collection Time: 07/25/19  4:00 PM  Result Value Ref Range   Ferritin 468 (H) 11 - 307 ng/mL    Comment: Performed at [facility], [place] [address] [place], [place] [address]  D-dimer, quantitative (not at [facility])     Status: Abnormal   Collection Time: 07/25/19  4:00 PM  Result Value Ref Range   D-Dimer, Quant 2.49 (H) 0.00 - 0.50 ug/mL-FEU    Comment: (NOTE) At the manufacturer cut-off of 0.50 ug/mL FEU, this assay has been documented to exclude PE with a sensitivity and negative predictive value of 97 to 99%.  At this time, this assay has not been approved by the FDA to exclude DVT/VTE. Results should be correlated with clinical presentation. Performed at [facility], [place] [address], [place], [place] [address]   Lactate dehydrogenase     Status: Abnormal   Collection Time: 07/25/19  4:00 PM  Result Value Ref Range   LDH 292 (H) 98 - 192 U/L    Comment: Performed at [facility], [place] [address], [place], [place] [address]  Hepatitis panel, acute     Status: None   Collection Time: 07/26/19  4:40 AM  Result Value Ref Range   Hepatitis B Surface Ag NON REACTIVE NON REACTIVE   HCV Ab NON REACTIVE NON REACTIVE    Comment: (NOTE)  Nonreactive HCV antibody screen is consistent with no HCV infections,  unless recent infection is suspected or other evidence exists to indicate HCV infection.    Hep A IgM NON REACTIVE NON REACTIVE   Hep B C IgM NON REACTIVE NON REACTIVE    Comment: Performed at [facility], [place] [address], [place], [place] [address]  Ethanol     Status: None   Collection Time: 07/26/19  4:40 AM  Result  Value Ref Range   Alcohol, Ethyl (B) <10 <10 mg/dL    Comment: (NOTE) Lowest detectable limit for serum alcohol is 10 mg/dL. For medical purposes only. Performed at [facility], [place] [address], [place], [place] [address]   ABO/Rh     Status: None   Collection Time: 07/26/19  4:40 AM  Result Value Ref Range   ABO/RH(D)      O POS Performed at [facility], [place] [address], [place], [place] [address]   CBC     Status: Abnormal   Collection Time: 07/26/19  4:40 AM  Result Value Ref Range   WBC 7.3 4.0 - 10.5 K/uL   RBC 3.32 (L) 3.87 - 5.11 MIL/uL   Hemoglobin 9.6 (L) 12.0 - 15.0 g/dL   HCT 28.0 (L) 36.0 - 46.0 %   MCV 84.3 80.0 - 100.0 fL   MCH 28.9 26.0 - 34.0 pg   MCHC 34.3 30.0 - 36.0 g/dL   RDW 16.3 (H) 11.5 - 15.5 %   Platelets 171 150 - 400 K/uL   nRBC 0.0 0.0 - 0.2 %    Comment: Performed at [facility], [place] [address] [place], [place] [address]  Creatinine, serum     Status: Abnormal   Collection Time: 07/26/19  4:40 AM  Result Value Ref Range   Creatinine, Ser 1.63 (H) 0.44 - 1.00 mg/dL   GFR calc non Af Amer 32 (L) >60 mL/min   GFR calc Af Amer 37 (L) >60 mL/min    Comment: Performed at [facility], [place] [address], [place], [place] [address]  Urinalysis, Routine w reflex microscopic     Status: Abnormal   Collection Time: 07/26/19  4:54 AM  Result Value Ref Range   Color, Urine YELLOW YELLOW   APPearance CLEAR CLEAR   Specific Gravity, Urine 1.006 1.005 - 1.030   pH 5.0 5.0 - 8.0   Glucose, UA NEGATIVE NEGATIVE mg/dL   Hgb urine dipstick SMALL (A) NEGATIVE   Bilirubin Urine NEGATIVE NEGATIVE   Ketones, ur NEGATIVE NEGATIVE mg/dL   Protein, ur NEGATIVE NEGATIVE mg/dL   Nitrite NEGATIVE NEGATIVE   Leukocytes,Ua NEGATIVE NEGATIVE   RBC / HPF 0-5 0 - 5 RBC/hpf   WBC, UA 0-5 0 - 5 WBC/hpf   Bacteria, UA RARE (A) NONE SEEN   Squamous Epithelial / LPF 0-5 0 - 5    Comment: Performed at  [facility], [place] [address] [place], [place] [address]   DG Chest Port 1 View  Result Date: 07/25/2019 CLINICAL DATA:  Fever, chills EXAM: PORTABLE CHEST 1 VIEW COMPARISON:  05/14/2019 FINDINGS: Right Port-A-Cath in place, unchanged. Heart and mediastinal contours are within normal limits. No focal opacities or effusions. No acute bony abnormality. IMPRESSION: No active disease. Electronically Signed   By: Rolm Baptise M.D.   On: 07/25/2019 11:44    Pending Labs Unresulted Labs (From admission, onward)    Start     Ordered   07/27/19 0500  Hemoglobin A1c  Tomorrow morning,   R    Comments: To assess prior glycemic control    07/26/19 1433   07/27/19 0000  CBC with Differential/Platelet  Daily,   R     07/26/19 0304   07/27/19 0000  Comprehensive metabolic panel  Daily,   R     07/26/19 0304   07/27/19 0000  C-reactive protein  Daily,   R     07/26/19 0304   07/27/19 0000  D-dimer, quantitative (not at [facility])  Daily,   R     07/26/19 0304   07/27/19 0000  Ferritin  Daily,   R     07/26/19 0304   07/27/19 0000  Magnesium  Daily,   R     07/26/19 0304   07/27/19 0000  Phosphorus  Daily,   R     07/26/19 0304          Vitals/Pain Today's Vitals   07/26/19 1400 07/26/19 1430 07/26/19 1500 07/26/19 1530  BP: (!) 129/54 130/78 125/72 (!) 125/92  Pulse: (!) 56 (!) 56 (!) 55 (!) 57  Resp: 12 12 15 12   Temp:      TempSrc:      SpO2: 95% 100% 100% 100%  PainSc:        Isolation Precautions Airborne and Contact precautions  Medications Medications  heparin injection 5,000 Units (5,000 Units Subcutaneous Given 07/26/19 1447)  0.9 %  sodium chloride infusion ( Intravenous New Bag/Given 07/26/19 0441)  dexamethasone (DECADRON) injection 6 mg (6 mg Intravenous Given 07/26/19 0441)  guaiFENesin-dextromethorphan (ROBITUSSIN DM) 100-10 MG/5ML syrup 10 mL (has no administration in time range)  ascorbic acid (VITAMIN C) tablet 500 mg (500 mg Oral Given 07/26/19 0954)   zinc sulfate capsule 220 mg (220 mg Oral Given 07/26/19 0953)  docusate sodium (COLACE) capsule 100 mg (100 mg Oral Given 07/26/19 0953)  polyethylene glycol (MIRALAX / GLYCOLAX) packet 17 g (has no administration in time range)  promethazine (PHENERGAN) tablet 12.5 mg (has no administration in time range)  aspirin EC tablet 81 mg (81 mg Oral Given 07/26/19 0954)  rosuvastatin (CRESTOR) tablet 40 mg (has no administration in time range)  sertraline (ZOLOFT) tablet 50 mg (50 mg Oral Given 07/26/19 0955)  pantoprazole (PROTONIX) EC tablet 40 mg (40 mg Oral Given 07/26/19 0955)  sucralfate (CARAFATE) tablet 1 g (1 g Oral Given 07/26/19 0954)  darifenacin (ENABLEX) 24 hr tablet 7.5 mg (7.5 mg Oral Given 07/26/19 0955)  clopidogrel (PLAVIX) tablet 75 mg (75 mg Oral Given 07/26/19 0956)  nystatin (MYCOSTATIN) 100000 UNIT/ML suspension 500,000 Units (500,000 Units Mouth/Throat Given 07/26/19 1447)  traMADol (ULTRAM) tablet 100 mg (100 mg Oral Given 07/26/19 0953)  insulin aspart (novoLOG) injection 0-9 Units (has no administration in time range)  insulin aspart (novoLOG) injection 0-5 Units (has no administration in time range)  sodium chloride 0.9 % bolus 1,000 mL (0 mLs Intravenous Stopped 07/25/19 1903)  sodium chloride 0.9 % bolus 1,000 mL (0 mLs Intravenous Stopped 07/26/19 0202)  albumin human 25 % solution 25 g (0 g Intravenous Stopped 07/26/19 0419)    Mobility walks Low fall risk   Focused Assessments    R Recommendations: See Admitting Provider Note  Report given to:   Additional Notes:

## 2019-07-26 NOTE — ED Notes (Signed)
Nuclear Medicine paged

## 2019-07-27 ENCOUNTER — Inpatient Hospital Stay (HOSPITAL_COMMUNITY): Payer: Medicare HMO

## 2019-07-27 DIAGNOSIS — R05 Cough: Secondary | ICD-10-CM | POA: Diagnosis not present

## 2019-07-27 DIAGNOSIS — U071 COVID-19: Secondary | ICD-10-CM | POA: Diagnosis not present

## 2019-07-27 DIAGNOSIS — R0902 Hypoxemia: Secondary | ICD-10-CM | POA: Diagnosis not present

## 2019-07-27 LAB — CBC WITH DIFFERENTIAL/PLATELET
Abs Immature Granulocytes: 0.03 10*3/uL (ref 0.00–0.07)
Basophils Absolute: 0 10*3/uL (ref 0.0–0.1)
Basophils Relative: 0 %
Eosinophils Absolute: 0 10*3/uL (ref 0.0–0.5)
Eosinophils Relative: 0 %
HCT: 28 % — ABNORMAL LOW (ref 36.0–46.0)
Hemoglobin: 9.8 g/dL — ABNORMAL LOW (ref 12.0–15.0)
Immature Granulocytes: 1 %
Lymphocytes Relative: 41 %
Lymphs Abs: 2.7 10*3/uL (ref 0.7–4.0)
MCH: 28.9 pg (ref 26.0–34.0)
MCHC: 35 g/dL (ref 30.0–36.0)
MCV: 82.6 fL (ref 80.0–100.0)
Monocytes Absolute: 0.6 10*3/uL (ref 0.1–1.0)
Monocytes Relative: 9 %
Neutro Abs: 3.3 10*3/uL (ref 1.7–7.7)
Neutrophils Relative %: 49 %
Platelets: 192 10*3/uL (ref 150–400)
RBC: 3.39 MIL/uL — ABNORMAL LOW (ref 3.87–5.11)
RDW: 16.1 % — ABNORMAL HIGH (ref 11.5–15.5)
WBC: 6.6 10*3/uL (ref 4.0–10.5)
nRBC: 0 % (ref 0.0–0.2)

## 2019-07-27 LAB — COMPREHENSIVE METABOLIC PANEL
ALT: 62 U/L — ABNORMAL HIGH (ref 0–44)
AST: 91 U/L — ABNORMAL HIGH (ref 15–41)
Albumin: 2.2 g/dL — ABNORMAL LOW (ref 3.5–5.0)
Alkaline Phosphatase: 93 U/L (ref 38–126)
Anion gap: 9 (ref 5–15)
BUN: 11 mg/dL (ref 8–23)
CO2: 22 mmol/L (ref 22–32)
Calcium: 7.6 mg/dL — ABNORMAL LOW (ref 8.9–10.3)
Chloride: 112 mmol/L — ABNORMAL HIGH (ref 98–111)
Creatinine, Ser: 1.11 mg/dL — ABNORMAL HIGH (ref 0.44–1.00)
GFR calc Af Amer: 60 mL/min — ABNORMAL LOW (ref >60)
GFR calc non Af Amer: 51 mL/min — ABNORMAL LOW (ref >60)
Glucose, Bld: 119 mg/dL — ABNORMAL HIGH (ref 70–99)
Potassium: 3.3 mmol/L — ABNORMAL LOW (ref 3.5–5.1)
Sodium: 143 mmol/L (ref 135–145)
Total Bilirubin: 1 mg/dL (ref 0.3–1.2)
Total Protein: 4.7 g/dL — ABNORMAL LOW (ref 6.5–8.1)

## 2019-07-27 LAB — D-DIMER, QUANTITATIVE: D-Dimer, Quant: 1.53 ug/mL-FEU — ABNORMAL HIGH (ref 0.00–0.50)

## 2019-07-27 LAB — C-REACTIVE PROTEIN: CRP: 0.7 mg/dL (ref <1.0)

## 2019-07-27 LAB — GLUCOSE, CAPILLARY
Glucose-Capillary: 157 mg/dL — ABNORMAL HIGH (ref 70–99)
Glucose-Capillary: 105 mg/dL — ABNORMAL HIGH (ref 70–99)
Glucose-Capillary: 169 mg/dL — ABNORMAL HIGH (ref 70–99)
Glucose-Capillary: 119 mg/dL — ABNORMAL HIGH (ref 70–99)

## 2019-07-27 LAB — HEMOGLOBIN A1C
Hgb A1c MFr Bld: 8.7 % — ABNORMAL HIGH (ref 4.8–5.6)
Mean Plasma Glucose: 202.99 mg/dL

## 2019-07-27 LAB — FERRITIN: Ferritin: 388 ng/mL — ABNORMAL HIGH (ref 11–307)

## 2019-07-27 LAB — ABO/RH: ABO/RH(D): O POS

## 2019-07-27 LAB — MAGNESIUM: Magnesium: 1.4 mg/dL — ABNORMAL LOW (ref 1.7–2.4)

## 2019-07-27 LAB — PHOSPHORUS: Phosphorus: 2.7 mg/dL (ref 2.5–4.6)

## 2019-07-27 MED ORDER — POTASSIUM CHLORIDE CRYS ER 20 MEQ PO TBCR
40.0000 meq | EXTENDED_RELEASE_TABLET | Freq: Once | ORAL | Status: AC
  Administered 2019-07-27: 40 meq via ORAL
  Filled 2019-07-27: qty 2

## 2019-07-27 MED ORDER — HYDROCORTISONE 1 % EX CREA
TOPICAL_CREAM | Freq: Two times a day (BID) | CUTANEOUS | Status: DC
  Administered 2019-07-27 – 2019-07-28 (×2): 1 via TOPICAL
  Filled 2019-07-27: qty 28

## 2019-07-27 MED ORDER — MAGNESIUM SULFATE 4 GM/100ML IV SOLN
4.0000 g | Freq: Once | INTRAVENOUS | Status: AC
  Administered 2019-07-27: 4 g via INTRAVENOUS
  Filled 2019-07-27: qty 100

## 2019-07-27 NOTE — Progress Notes (Addendum)
PROGRESS NOTE    LILYAHNA SIRMON  WOE:[id number] DOB: [birth year] DOA: 07/25/2019 PCP: Ann Held, DO   Brief Narrative:  [name] is Derra Shartzer 69 y.o.femalewith past medical history significant forDM2, HLD, Asthma who presented to the ED for progressive cough, dyspnea over the past week. OTCs had not helped and it seemed out of the ordinary for her. She denies any aggravating factors. She had Ezzie Senat video visit with her PCP. At that time she was prescribed Kodie Pick z-pack. She had just about finished it up but noticed that she had no real improvement in her symptoms.  ED Course:CXR showed no disease. She was somewhat hypotensive. Found to be in AKI and COVID+. TRH was called for admission.   Assessment & Plan:   Principal Problem:   COVID-19 virus infection Active Problems:   Type 2 diabetes with nephropathy (HCC)   CKD (chronic kidney disease) stage 3, GFR 30-59 ml/min (HCC)   Hypotension   AKI (acute kidney injury) ([place])   Elevated LFTs   COVID-19  Covid-19 Virus Infection Currently on RA.  She was treated outpatient for cough, dyspnea with azithromycin. CXR 07/27/19 without active disease. Receiving steroids.  May discontinue this soon without hypoxia. No indication at this time for remdesivir I/O, daily weights IS, OOB  COVID-19 Labs  Recent Labs    07/25/19 1600 07/27/19 0240  DDIMER 2.49* 1.53*  FERRITIN 468* 388*  LDH 292*  --   CRP 0.9 0.7    Lab Results  Component Value Date   SARSCOV2NAA Detected (Elleni Mozingo) 07/25/2019   SARSCOV2NAA NEGATIVE 05/13/2019   [place] NEGATIVE 03/22/2019   [place] NEGATIVE 03/22/2019   Elevated D Dimer: likely 2/2 above.  Follow LE [place].  AKI on CKD stage IIIb Patient presenting with elevated creatinine of 1.93 with Melda Mermelstein baseline roughly 1.4.  Etiology likely secondary to decreased oral intake, dehydration; from underlying Covid-19 viral infection. --Cr 1.93-->1.63 --> 1.11 on 07/27/19 --continue to monitor  Elevated LFTs AST and  ALT elevated at 192 and 91 respectively.  EtOH level less than 10.  Bilirubin within normal limits.  Acute hepatitis panel negative.  Suspect etiology related to hepatic impairment from underlying Covid-19 versus possible suspicion for EtOH abuse given AST/ALT ratio. --improving, follow  Hypotension: Resolved On initial presentation, BP 62/52, likely secondary to dehydration.  Responded well to IV fluid hydration.  Blood pressure now up to 123/78 today. --Continue monitor blood pressure closely  Type 2 diabetes mellitus Last hemoglobin A1c 10.0 on 05/13/2019.  Home regimen includes Metformin 500 mg BID. --Check hemoglobin A1c (8.7) --Insulin sliding scale for coverage --CBGs q4h qAC/HS  Recurrent Falls: she notes she's been falling more often, last about 2 weeks ago.  Will have PT work with her to determine ability to go home safely (vs need for rehab) Therapy evaluation  Hypokalemia  Hypomagnesemia: replace and follow  DVT prophylaxis: heparin Code Status: full Family Communication: none at bedside - daughter 1/2 Disposition Plan: pending further improvement   Consultants:   none  Procedures:   none  Antimicrobials:  Anti-infectives (From admission, onward)   None      Subjective: Hasn't been eating, food has been cold when delivered on occasion achy  Objective: Vitals:   07/26/19 2030 07/26/19 2109 07/27/19 0420 07/27/19 0800  BP: 110/68 114/67 113/77 104/60  Pulse: 65 62 81 75  Resp: 20 18 20    Temp: 98.4 F (36.9 C) 98.4 F (36.9 C) 98.7 F (37.1 C) 98.1 F (36.7 C)  TempSrc: Oral Oral Oral   SpO2: 99%  100% 99%  Weight:  96.2 kg    Height:  5' 6"  (1.676 m)      Intake/Output Summary (Last 24 hours) at 07/27/2019 1437 Last data filed at 07/27/2019 0800 Gross per 24 hour  Intake 3443.46 ml  Output --  Net 3443.46 ml   Filed Weights   07/26/19 2109  Weight: 96.2 kg    Examination:  General exam: Appears calm and comfortable  Respiratory  system: Clear to auscultation. Respiratory effort normal. Cardiovascular system:RRR Gastrointestinal system: Abdomen is nondistended, soft and nontender.  Central nervous system: Alert and oriented. No focal neurological deficits. Extremities: Symmetric 5 x 5 power. Skin: No rashes, lesions or ulcers Psychiatry: Judgement and insight appear normal. Mood & affect appropriate.     Data Reviewed: I have personally reviewed following labs and imaging studies  CBC: Recent Labs  Lab 07/25/19 0957 07/26/19 0440 07/27/19 0240  WBC 7.2 7.3 6.6  NEUTROABS 3.0  --  3.3  HGB 11.8* 9.6* 9.8*  HCT 35.3* 28.0* 28.0*  MCV 86.5 84.3 82.6  PLT 189 171 237   Basic Metabolic Panel: Recent Labs  Lab 07/25/19 0957 07/26/19 0440 07/27/19 0240  NA 145  --  143  K 4.3  --  3.3*  CL 114*  --  112*  CO2 22  --  22  GLUCOSE 104*  --  119*  BUN 16  --  11  CREATININE 1.93* 1.63* 1.11*  CALCIUM 8.0*  --  7.6*  MG  --   --  1.4*  PHOS  --   --  2.7   GFR: Estimated Creatinine Clearance: 57.5 mL/min (Thomasina Housley) (by C-G formula based on SCr of 1.11 mg/dL (H)). Liver Function Tests: Recent Labs  Lab 07/25/19 0957 07/27/19 0240  AST 192* 91*  ALT 91* 62*  ALKPHOS 118 93  BILITOT 0.8 1.0  PROT 5.5* 4.7*  ALBUMIN 2.0* 2.2*   Recent Labs  Lab 07/25/19 0957  LIPASE 14   No results for input(s): AMMONIA in the last 168 hours. Coagulation Profile: No results for input(s): INR, PROTIME in the last 168 hours. Cardiac Enzymes: No results for input(s): CKTOTAL, CKMB, CKMBINDEX, TROPONINI in the last 168 hours. BNP (last 3 results) No results for input(s): PROBNP in the last 8760 hours. HbA1C: Recent Labs    07/27/19 0240  HGBA1C 8.7*   CBG: Recent Labs  Lab 07/26/19 1800 07/26/19 2047 07/27/19 0739 07/27/19 1136  GLUCAP 174* 174* 157* 105*   Lipid Profile: No results for input(s): CHOL, HDL, LDLCALC, TRIG, CHOLHDL, LDLDIRECT in the last 72 hours. Thyroid Function Tests: No results  for input(s): TSH, T4TOTAL, FREET4, T3FREE, THYROIDAB in the last 72 hours. Anemia Panel: Recent Labs    07/25/19 1600 07/27/19 0240  FERRITIN 468* 388*   Sepsis Labs: No results for input(s): PROCALCITON, LATICACIDVEN in the last 168 hours.  Recent Results (from the past 240 hour(s))  Novel Coronavirus, NAA ([facility])     Status: Abnormal   Collection Time: 07/25/19  8:47 AM   Specimen: Nasopharyngeal(NP) swabs in vial transport medium   NASOPHARYNGE  TESTING  Result Value Ref Range Status   SARS-CoV-2, NAA Detected (Naveed Humphres) Not Detected Final    Comment: Testing was performed using the cobas(R) SARS-CoV-2 test. This nucleic acid amplification test was developed and its performance characteristics determined by [employer]. Nucleic acid amplification tests include PCR and TMA. This test has not been FDA cleared or approved. This  test has been authorized by FDA under an Emergency Use Authorization (EUA). This test is only authorized for the duration of time the declaration that circumstances exist justifying the authorization of the emergency use of in vitro diagnostic tests for detection of SARS-CoV-2 virus and/or diagnosis of COVID-19 infection under section 564(b)(1) of the Act, 21 U.S.C. 244WNU-2(V) (1), unless the authorization is terminated or revoked sooner. When diagnostic testing is negative, the possibility of Aarya Quebedeaux false negative result should be considered in the context of Vona Whiters patient's recent exposures and the presence of clinical signs and symptoms consistent with COVID-19. An individual without symptoms  of COVID-19 and who is not shedding SARS-CoV-2 virus would expect to have Uzair Godley negative (not detected) result in this assay.          Radiology Studies: DG CHEST PORT 1 VIEW  Result Date: 07/27/2019 CLINICAL DATA:  hypoxia EXAM: PORTABLE CHEST 1 VIEW COMPARISON:  Chest radiograph 08/24/2018 FINDINGS: Enlargement of the cardiomediastinal contours likely secondary  to AP technique. Right central venous catheter is unchanged. The lungs are clear. No pneumothorax or large pleural effusion. No acute finding in the visualized skeleton. IMPRESSION: No evidence of active disease. Electronically Signed   By: Audie Pinto M.D.   On: 07/27/2019 10:45        Scheduled Meds: . vitamin C  500 mg Oral Daily  . aspirin EC  81 mg Oral Daily  . Chlorhexidine Gluconate Cloth  6 each Topical Daily  . clopidogrel  75 mg Oral Daily  . darifenacin  7.5 mg Oral Daily  . dexamethasone (DECADRON) injection  6 mg Intravenous Q24H  . docusate sodium  100 mg Oral BID  . heparin  5,000 Units Subcutaneous Q8H  . insulin aspart  0-5 Units Subcutaneous QHS  . insulin aspart  0-9 Units Subcutaneous TID WC  . nystatin  5 mL Mouth/Throat QID  . pantoprazole  40 mg Oral Daily  . rosuvastatin  40 mg Oral QHS  . sertraline  50 mg Oral Daily  . sucralfate  1 g Oral BID  . zinc sulfate  220 mg Oral Daily   Continuous Infusions: . sodium chloride Stopped (07/27/19 0616)     LOS: 2 days    Time spent: over 73 min    Fayrene Helper, MD [facility] Pager AMION  If 7PM-7AM, please contact night-coverage [email or web address] Password [facility] 07/27/2019, 2:37 PM

## 2019-07-27 NOTE — Evaluation (Signed)
Physical Therapy Evaluation Patient Details Name: [name] MRN: [id number] DOB: 1951/05/13 Today's Date: 07/27/2019   History of Present Illness  69 y.o. female with past medical history significant for DM2, HLD, Asthma who presented to the ED for progressive cough, dyspnea over the past week. OTCs had not helped and it seemed out of the ordinary for her. She denies any aggravating factors. She had a video visit with her PCP. At that time she was prescribed a z-pack. She had just about finished it up but noticed that she had no real improvement in her symptoms.  Clinical Impression   Pt admitted with above diagnosis. PTA was living alone and was independent at 4 wheeled walker level. Pt currently with functional limitations due to the deficits listed below (see PT Problem List). This pm pt is moving quite well, she is in room air and sats in high 90s throughout, she was able to ambulate in room and into rest room complete wash up and return to room. On return to room stated she felt she was short of breath (sats reading 98%) needing min a to ambulate back to chair (she chose this over taking break on [facility]). Pt will benefit from skilled PT to increase their independence and safety with mobility to allow discharge to the venue listed below.       Follow Up Recommendations Home health PT    Equipment Recommendations  None recommended by PT    Recommendations for Other Services       Precautions / Restrictions Precautions Precautions: Fall Restrictions Weight Bearing Restrictions: No      Mobility  Bed Mobility Overal bed mobility: Needs Assistance Bed Mobility: Rolling;Supine to Sit Rolling: Supervision   Supine to sit: Min assist        Transfers Overall transfer level: Needs assistance   Transfers: Sit to/from Stand Sit to Stand: Min assist         General transfer comment: sit<>stand from elevated bed w/ SBA from low commode with min  a  Ambulation/Gait Ambulation/Gait assistance: Min assist Gait Distance (Feet): 30 Feet Assistive device: Rolling walker (2 wheeled) Gait Pattern/deviations: Step-through pattern     General Gait Details: slow cadenced, initially did well but then stated she was feeling short of breath (on room air sats 98%) PT asked if she needed to sit down and pt stated then id have to stand up again and chose to ambulate to chair.  Stairs            Wheelchair Mobility    Modified Rankin (Stroke Patients Only)       Balance Overall balance assessment: Needs assistance Sitting-balance support: Feet supported Sitting balance-Leahy Scale: Good     Standing balance support: Bilateral upper extremity supported;During functional activity Standing balance-Leahy Scale: Fair                               Pertinent Vitals/Pain Pain Assessment: Faces Faces Pain Scale: Hurts even more Pain Location: general with some mobility Pain Descriptors / Indicators: Grimacing;Discomfort Pain Intervention(s): Limited activity within patient's tolerance    Home Living Family/patient expects to be discharged to:: Private residence Living Arrangements: Alone   Type of Home: Apartment Home Access: Stairs to enter Entrance Stairs-Rails: Right Entrance Stairs-Number of Steps: 1 Home Layout: One level Home Equipment: Walker - 4 wheels      Prior Function Level of Independence: Independent with assistive device(s)  Comments: states uses a 4 wheeled walker at home     Hand Dominance        Extremity/Trunk Assessment   Upper Extremity Assessment Upper Extremity Assessment: Generalized weakness    Lower Extremity Assessment Lower Extremity Assessment: Generalized weakness    Cervical / Trunk Assessment Cervical / Trunk Assessment: Normal  Communication   Communication: No difficulties  Cognition Arousal/Alertness: Awake/alert Behavior During Therapy: WFL for  tasks assessed/performed Overall Cognitive Status: No family/caregiver present to determine baseline cognitive functioning                                 General Comments: seems to be at baseline      General Comments      Exercises     Assessment/Plan    PT Assessment Patient needs continued PT services  PT Problem List Decreased range of motion;Decreased strength;Decreased activity tolerance;Decreased balance;Decreased mobility;Decreased coordination;Decreased safety awareness       PT Treatment Interventions Gait training;Functional mobility training;Therapeutic activities;Therapeutic exercise;Balance training;Neuromuscular re-education;Patient/family education    PT Goals (Current goals can be found in the Care Plan section)  Acute Rehab PT Goals Patient Stated Goal: to get better and go home PT Goal Formulation: With patient Time For Goal Achievement: 08/10/19 Potential to Achieve Goals: Good    Frequency Min 3X/week   Barriers to discharge Other (comment) lives alone    Co-evaluation               AM-PAC PT "6 Clicks" Mobility  Outcome Measure Help needed turning from your back to your side while in a flat bed without using bedrails?: None Help needed moving from lying on your back to sitting on the side of a flat bed without using bedrails?: None Help needed moving to and from a bed to a chair (including a wheelchair)?: A Little Help needed standing up from a chair using your arms (e.g., wheelchair or bedside chair)?: A Little Help needed to walk in hospital room?: A Little Help needed climbing 3-5 steps with a railing? : A Lot 6 Click Score: 19    End of Session Equipment Utilized During Treatment: Gait belt Activity Tolerance: Treatment limited secondary to medical complications (Comment);Patient limited by fatigue Patient left: in chair;with call bell/phone within reach;with nursing/sitter in room Nurse Communication: Mobility  status PT Visit Diagnosis: Other abnormalities of gait and mobility (R26.89);Muscle weakness (generalized) (M62.81)    Time: 1062-6948 PT Time Calculation (min) (ACUTE ONLY): 24 min   Charges:   PT Evaluation $PT Eval Moderate Complexity: 1 Mod PT Treatments $Gait Training: 8-22 mins        Horald Chestnut, PT   Delford Field 07/27/2019, 4:14 PM

## 2019-07-28 ENCOUNTER — Inpatient Hospital Stay (HOSPITAL_COMMUNITY): Payer: Medicare HMO

## 2019-07-28 DIAGNOSIS — R791 Abnormal coagulation profile: Secondary | ICD-10-CM | POA: Diagnosis not present

## 2019-07-28 DIAGNOSIS — U071 COVID-19: Secondary | ICD-10-CM

## 2019-07-28 DIAGNOSIS — R05 Cough: Secondary | ICD-10-CM | POA: Diagnosis not present

## 2019-07-28 DIAGNOSIS — R0902 Hypoxemia: Secondary | ICD-10-CM | POA: Diagnosis not present

## 2019-07-28 LAB — COMPREHENSIVE METABOLIC PANEL
ALT: 67 U/L — ABNORMAL HIGH (ref 0–44)
AST: 87 U/L — ABNORMAL HIGH (ref 15–41)
Albumin: 2.5 g/dL — ABNORMAL LOW (ref 3.5–5.0)
Alkaline Phosphatase: 112 U/L (ref 38–126)
Anion gap: 8 (ref 5–15)
BUN: 10 mg/dL (ref 8–23)
CO2: 22 mmol/L (ref 22–32)
Calcium: 8 mg/dL — ABNORMAL LOW (ref 8.9–10.3)
Chloride: 113 mmol/L — ABNORMAL HIGH (ref 98–111)
Creatinine, Ser: 1.22 mg/dL — ABNORMAL HIGH (ref 0.44–1.00)
GFR calc Af Amer: 53 mL/min — ABNORMAL LOW (ref >60)
GFR calc non Af Amer: 46 mL/min — ABNORMAL LOW (ref >60)
Glucose, Bld: 118 mg/dL — ABNORMAL HIGH (ref 70–99)
Potassium: 4.2 mmol/L (ref 3.5–5.1)
Sodium: 143 mmol/L (ref 135–145)
Total Bilirubin: 1 mg/dL (ref 0.3–1.2)
Total Protein: 5.6 g/dL — ABNORMAL LOW (ref 6.5–8.1)

## 2019-07-28 LAB — CBC WITH DIFFERENTIAL/PLATELET
Abs Immature Granulocytes: 0.02 10*3/uL (ref 0.00–0.07)
Basophils Absolute: 0 10*3/uL (ref 0.0–0.1)
Basophils Relative: 0 %
Eosinophils Absolute: 0 10*3/uL (ref 0.0–0.5)
Eosinophils Relative: 0 %
HCT: 32.4 % — ABNORMAL LOW (ref 36.0–46.0)
Hemoglobin: 11.4 g/dL — ABNORMAL LOW (ref 12.0–15.0)
Immature Granulocytes: 0 %
Lymphocytes Relative: 44 %
Lymphs Abs: 4.2 10*3/uL — ABNORMAL HIGH (ref 0.7–4.0)
MCH: 29.1 pg (ref 26.0–34.0)
MCHC: 35.2 g/dL (ref 30.0–36.0)
MCV: 82.7 fL (ref 80.0–100.0)
Monocytes Absolute: 0.8 10*3/uL (ref 0.1–1.0)
Monocytes Relative: 8 %
Neutro Abs: 4.6 10*3/uL (ref 1.7–7.7)
Neutrophils Relative %: 48 %
Platelets: 238 10*3/uL (ref 150–400)
RBC: 3.92 MIL/uL (ref 3.87–5.11)
RDW: 16.3 % — ABNORMAL HIGH (ref 11.5–15.5)
WBC: 9.7 10*3/uL (ref 4.0–10.5)
nRBC: 0 % (ref 0.0–0.2)

## 2019-07-28 LAB — GLUCOSE, CAPILLARY
Glucose-Capillary: 155 mg/dL — ABNORMAL HIGH (ref 70–99)
Glucose-Capillary: 178 mg/dL — ABNORMAL HIGH (ref 70–99)
Glucose-Capillary: 154 mg/dL — ABNORMAL HIGH (ref 70–99)
Glucose-Capillary: 125 mg/dL — ABNORMAL HIGH (ref 70–99)

## 2019-07-28 LAB — C-REACTIVE PROTEIN: CRP: 0.5 mg/dL (ref <1.0)

## 2019-07-28 LAB — PHOSPHORUS: Phosphorus: 2.3 mg/dL — ABNORMAL LOW (ref 2.5–4.6)

## 2019-07-28 LAB — MAGNESIUM: Magnesium: 2.3 mg/dL (ref 1.7–2.4)

## 2019-07-28 LAB — FERRITIN: Ferritin: 451 ng/mL — ABNORMAL HIGH (ref 11–307)

## 2019-07-28 LAB — D-DIMER, QUANTITATIVE: D-Dimer, Quant: 1.57 ug/mL-FEU — ABNORMAL HIGH (ref 0.00–0.50)

## 2019-07-28 MED ORDER — ONDANSETRON HCL 4 MG/2ML IJ SOLN
4.0000 mg | Freq: Four times a day (QID) | INTRAMUSCULAR | Status: DC | PRN
  Administered 2019-07-28: 4 mg via INTRAVENOUS
  Filled 2019-07-28: qty 2

## 2019-07-28 MED ORDER — APIXABAN 5 MG PO TABS
10.0000 mg | ORAL_TABLET | Freq: Two times a day (BID) | ORAL | Status: DC
  Administered 2019-07-28 – 2019-07-29 (×4): 10 mg via ORAL
  Filled 2019-07-28 (×5): qty 2

## 2019-07-28 MED ORDER — APIXABAN 5 MG PO TABS: 5.0000 mg | ORAL_TABLET | Freq: Two times a day (BID) | ORAL | Status: DC

## 2019-07-28 NOTE — Plan of Care (Signed)

## 2019-07-28 NOTE — Progress Notes (Signed)
ANTICOAGULATION CONSULT NOTE - Initial Consult  Pharmacy Consult for Apixaban Indication: DVT  Allergies  Allergen Reactions  . Adhesive [Tape] Other (See Comments)    Burn Skin  . Codeine Other (See Comments)    paranoid  . Zocor [Simvastatin - High Dose] Other (See Comments)    Muscle spasms  . Penicillins Rash    Has patient had a PCN reaction causing immediate rash, facial/tongue/throat swelling, SOB or lightheadedness with hypotension: Yes Has patient had a PCN reaction causing severe rash involving mucus membranes or skin necrosis: No Has patient had a PCN reaction that required hospitalization does not remember.  Has patient had a PCN reaction occurring within the last 10 years: childhood If all of the above answers are "NO", then may proceed with Cephalosporin use.     Patient Measurements: Height: 5' 6"  (167.6 cm) Weight: 212 lb 1.3 oz (96.2 kg) IBW/kg (Calculated) : 59.3  Vital Signs: Temp: 98.3 F (36.8 C) (01/03 1205) Temp Source: Oral (01/03 1205) BP: 111/68 (01/03 1204) Pulse Rate: 60 (01/03 1205)  Labs: Recent Labs    07/26/19 0440 07/27/19 0240 07/28/19 0223  HGB 9.6* 9.8* 11.4*  HCT 28.0* 28.0* 32.4*  PLT 171 192 238  CREATININE 1.63* 1.11* 1.22*    Estimated Creatinine Clearance: 52.3 mL/min (A) (by C-G formula based on SCr of 1.22 mg/dL (H)).   Medical History: Past Medical History:  Diagnosis Date  . Asthma   . Degenerative joint disease   . Diabetes mellitus   . Dyspnea   . GERD (gastroesophageal reflux disease)   . Hyperlipidemia    no longer on medication for this  . Hypertension    patient denies  . Myocardial infarction ([place]) 08/2018  . Neuropathy    bilateral hands and feet  . Sickle cell trait ([place])   . Sleep apnea   . stomach ca dx'd 2010   chemo/xrt comp 05/2009  . TIA (transient ischemic attack) 07/2015    Medications:  Scheduled:  . vitamin C  500 mg Oral Daily  . aspirin EC  81 mg Oral Daily  . Chlorhexidine  Gluconate Cloth  6 each Topical Daily  . clopidogrel  75 mg Oral Daily  . darifenacin  7.5 mg Oral Daily  . dexamethasone (DECADRON) injection  6 mg Intravenous Q24H  . docusate sodium  100 mg Oral BID  . heparin  5,000 Units Subcutaneous Q8H  . hydrocortisone cream   Topical BID  . insulin aspart  0-5 Units Subcutaneous QHS  . insulin aspart  0-9 Units Subcutaneous TID WC  . nystatin  5 mL Mouth/Throat QID  . pantoprazole  40 mg Oral Daily  . rosuvastatin  40 mg Oral QHS  . sertraline  50 mg Oral Daily  . sucralfate  1 g Oral BID  . zinc sulfate  220 mg Oral Daily   Infusions:  . sodium chloride 75 mL/hr at 07/28/19 0203    Assessment: 35 yoF admitted on 12/31 with COVID-19 pneumonia is found to have DVT. Pharmacy is consulted to dose apixaban.  SCr and CBC stable near baseline. Currently on Heparin 5000 units [place] TID (cost recent dose on 1/3 at 0511), Plavix, Aspirin  Goal of Therapy:  Monitor platelets by anticoagulation protocol: Yes   Plan:  D/c heparin Apixaban 27m PO BID x7 days, then 5 mg PO BID. Pharmacy to provide education prior to discharge. Pharmacy will sign off notes, but continue to follow peripherally.  CGretta ArabPharmD, BCPS Clinical pharmacist phone  7am- 5pm: [phone] 07/28/2019 1:47 PM

## 2019-07-28 NOTE — Progress Notes (Signed)
Occupational Therapy Evaluation Patient Details Name: [name] MRN: [id number] DOB: 1951/03/17 Today's Date: 07/28/2019    History of Present Illness 69 y.o. female with past medical history significant for DM2, HLD, Asthma who presented to the ED for progressive cough, dyspnea over the past week. OTCs had not helped and it seemed out of the ordinary for her. She denies any aggravating factors. She had a video visit with her PCP. At that time she was prescribed a z-pack. She had just about finished it up but noticed that she had no real improvement in her symptoms.   Clinical Impression   PTA pt lived alone, independent in all ADL, IADL, and mobility tasks. Pt reports using a 4WW and has had 4 falls in the last 6 months. Pt does not have a car to drive and reports that daughter takes her grocery shopping. Pt states that daughter may be able to assist her at home if she doesn't have Covid-19. Pt currently independent to min guard for self-care and functional transfer tasks. Pt able to ambulate to/from bathroom with RW, noting 0 instances of LOB. Pt able to complete toileting task as well as grooming/hygiene at the sink. Pt's SpO2 maintained in 90s throughout on room air. 2/4 DOE. Educated and provided pt with handouts regarding energy conservation and grocery/meal delivery services. Educated pt on relaxation strategies when anxiety (related to shortness of breath) presents, noting good understanding. Pt demonstrates decreased strength, endurance, balance, standing tolerance, and activity tolerance impacting ability to complete self-care and functional transfer tasks. Recommend skilled OT services to address above deficits in order to promote function and prevent further decline. Recommend [facility] for continued rehab following hospital discharge.    Follow Up Recommendations  Home health OT;Supervision - Intermittent    Equipment Recommendations  None recommended by OT    Recommendations for Other  Services       Precautions / Restrictions Precautions Precautions: Fall Restrictions Weight Bearing Restrictions: No      Mobility Bed Mobility Overal bed mobility: Needs Assistance Bed Mobility: Supine to Sit     Supine to sit: Supervision;HOB elevated     General bed mobility comments: use of bedrail for assist  Transfers Overall transfer level: Needs assistance Equipment used: Rolling walker (2 wheeled) Transfers: Sit to/from Stand Sit to Stand: Min guard         General transfer comment: with use of RW    Balance Overall balance assessment: Needs assistance Sitting-balance support: Feet supported Sitting balance-Leahy Scale: Good       Standing balance-Leahy Scale: Fair                             ADL either performed or assessed with clinical judgement   ADL Overall ADL's : Needs assistance/impaired Eating/Feeding: Independent;Sitting   Grooming: Supervision/safety;Standing   Upper Body Bathing: Supervision/ safety;Standing   Lower Body Bathing: Supervison/ safety;Min guard;Sit to/from stand   Upper Body Dressing : Supervision/safety;Set up;Sitting   Lower Body Dressing: Set up;Supervision/safety;Sit to/from stand   Toilet Transfer: Supervision/safety;Set up;Ambulation;Regular Toilet;Grab bars   Toileting- Clothing Manipulation and Hygiene: Supervision/safety;Set up;Sit to/from stand       Functional mobility during ADLs: Min guard;Rolling walker General ADL Comments: Pt able to ambulate to/from bathroom with RW and min guard. Noted 0 instances of LOB.      Vision Baseline Vision/History: Wears glasses Wears Glasses: Reading only       Perception  Praxis      Pertinent Vitals/Pain Pain Assessment: No/denies pain     Hand Dominance Right   Extremity/Trunk Assessment Upper Extremity Assessment Upper Extremity Assessment: LUE deficits/detail;Generalized weakness LUE Deficits / Details: LUE shld flex ~80 degrees. LUE  elbow, wrist, and digit ROM WFL.   Lower Extremity Assessment Lower Extremity Assessment: Defer to PT evaluation       Communication Communication Communication: No difficulties   Cognition Arousal/Alertness: Awake/alert Behavior During Therapy: WFL for tasks assessed/performed Overall Cognitive Status: No family/caregiver present to determine baseline cognitive functioning                                     General Comments  Pt on room air with SpO2 maintained in 90s throughout. Pt able to ambulate to/from bathroom and complete toileting task as well as grooming/hygiene at the sink. Educated and provided pt with handouts regarding energy conservation and grocery delivery services.     Exercises     Shoulder Instructions      Home Living Family/patient expects to be discharged to:: Private residence Living Arrangements: Alone Available Help at Discharge: Other (Comment)(no one to assist. Has a fall alert system) Type of Home: Apartment Home Access: Stairs to enter Entrance Stairs-Number of Steps: 1 Entrance Stairs-Rails: Right Home Layout: One level     Bathroom Shower/Tub: Teacher, early years/pre: Standard     Home Equipment: Environmental consultant - 4 wheels;Shower seat   Additional Comments: Pt reports daughter may be able to assist if she doesn't have COVID.      Prior Functioning/Environment Level of Independence: Independent with assistive device(s)        Comments: Pt independent with ADLs, IADLs, and mobility. Pt reports using a 4WW at home. Pt reports 4 falls in the last 6 months. Pt states that she can drive, however does not have a car. Pt reports that daughter taskes her grocery shopping.        OT Problem List: Decreased strength;Decreased range of motion;Decreased activity tolerance;Impaired balance (sitting and/or standing);Cardiopulmonary status limiting activity      OT Treatment/Interventions: Self-care/ADL training;Therapeutic  exercise;Neuromuscular education;Energy conservation;DME and/or AE instruction;Therapeutic activities;Patient/family education;Balance training    OT Goals(Current goals can be found in the care plan section) Acute Rehab OT Goals Patient Stated Goal: To go home Time For Goal Achievement: 08/11/19 Potential to Achieve Goals: Good ADL Goals Pt Will Perform Grooming: with modified independence;standing Pt Will Perform Lower Body Bathing: with modified independence;sit to/from stand Pt Will Perform Lower Body Dressing: with modified independence;sit to/from stand Pt Will Transfer to Toilet: with modified independence;ambulating;regular height toilet Pt Will Perform Toileting - Clothing Manipulation and hygiene: with modified independence;sit to/from stand Additional ADL Goal #1: Pt to tolerate standing up to 10 min with modified independence, in preparation for ADLs. Additional ADL Goal #2: Pt to recall and verbalize 3 energy conservation strategies with 0 verbal cues. Additional ADL Goal #3: Pt to recall and verbalize 3 fall prevention strategies with 0 verbal cues.  OT Frequency: Min 3X/week   Barriers to D/C: Other (comment)  Pt lives alone       Co-evaluation              AM-PAC OT "6 Clicks" Daily Activity     Outcome Measure Help from another person eating meals?: None Help from another person taking care of personal grooming?: A Little Help from another person toileting,  which includes using toliet, bedpan, or urinal?: A Little Help from another person bathing (including washing, rinsing, drying)?: A Little Help from another person to put on and taking off regular upper body clothing?: A Little Help from another person to put on and taking off regular lower body clothing?: A Little 6 Click Score: 19   End of Session Equipment Utilized During Treatment: Rolling walker Nurse Communication: Mobility status  Activity Tolerance: Patient tolerated treatment well Patient left:  in chair;with call bell/phone within reach  OT Visit Diagnosis: Unsteadiness on feet (R26.81);Muscle weakness (generalized) (M62.81)                Time: 4237-0230 OT Time Calculation (min): 28 min Charges:  OT General Charges $OT Visit: 1 Visit OT Evaluation $OT Eval Low Complexity: 1 Low OT Treatments $Self Care/Home Management : 8-22 mins  Mauri Brooklyn OTR/L [phone]   Mauri Brooklyn 07/28/2019, 11:34 AM

## 2019-07-28 NOTE — Progress Notes (Signed)
PROGRESS NOTE    [name]  OAC:[id number] DOB: Jan 27, 1951 DOA: 07/25/2019 PCP: Ann Held, DO   Brief Narrative:  [name] is [name] 69 y.o.femalewith past medical history significant forDM2, HLD, Asthma who presented to the ED for progressive cough, dyspnea over the past week. OTCs had not helped and it seemed out of the ordinary for her. She denies any aggravating factors. She had [name] video visit with her PCP. At that time she was prescribed [name] z-pack. She had just about finished it up but noticed that she had no real improvement in her symptoms.  ED Course:CXR showed no disease. She was somewhat hypotensive. Found to be in AKI and COVID+. TR[facility] was called for admission.   Assessment & Plan:   Principal Problem:   COVID-19 virus infection Active Problems:   Type 2 diabetes with nephropathy ([name])   CKD (chronic kidney disease) stage 3, GFR 30-59 ml/min ([name])   Hypotension   AKI (acute kidney injury) ([name])   Elevated LFTs   COVID-19  Covid-19 Virus Infection Currently on RA.  She was treated outpatient for cough, dyspnea with azithromycin. CXR 07/27/19 without active disease. D/c steroids with no hypoxia No indication at this time for remdesivir I/O, daily weights IS, OOB  COVID-19 Labs  Recent Labs    07/27/19 0240 07/28/19 0223  DDIMER 1.53* 1.57*  FERRITIN 388* 451*  CRP 0.7 0.5    Lab Results  Component Value Date   SARSCOV2NAA Detected ([name]) 07/25/2019   [place] NEGATIVE 05/13/2019   [name] NEGATIVE 03/22/2019   [name] NEGATIVE 03/22/2019   Right Lower Extremity DVT of the Peroneal Veins: started on eliquis.  Will need 30 day free card.  Prohibitively expensive and she doesn't think family will be able to help.  Will need to reach out to outpatient providers to ensure good follow up and plan for continued anticoagulation.  Needs at least 3 months.  CAD s/p Stent 08/2018:  Was on DAPT after stent placed 08/2018 to mid L circumflex.   Discussed with cards given initiation of anticoagulation for DVT above.  Will stop aspirin and continue eliquis/plavix, cards agreed.    AKI on CKD stage IIIb Patient presenting with elevated creatinine of 1.93 with [name] baseline roughly 1.4.  Etiology likely secondary to decreased oral intake, dehydration; from underlying Covid-19 viral infection. --Cr 1.93-->1.63 --> 1.11 on 07/27/19 --1.22 today, follow, relatively stable  Elevated LFTs AST and ALT elevated at 192 and 91 respectively.  EtOH level less than 10.  Bilirubin within normal limits.  Acute hepatitis panel negative.  Suspect etiology related to hepatic impairment from underlying Covid-19 versus possible suspicion for EtOH abuse given AST/ALT ratio. --irelatively stable, follow  Hypotension: Resolved On initial presentation, BP 62/52, likely secondary to dehydration.  Responded well to IV fluid hydration.  Blood pressure now up to 123/78 today. --Continue monitor blood pressure closely  Type 2 diabetes mellitus Last hemoglobin A1c 10.0 on 05/13/2019.  Home regimen includes Metformin 500 mg BID. --Check hemoglobin A1c (8.7) --Insulin sliding scale for coverage --CBGs q4h qAC/HS  Recurrent Falls: she notes she's been falling more often, last about 2 weeks ago.  Will have PT work with her to determine ability to go home safely (vs need for rehab) Therapy evaluation  Hypokalemia  Hypomagnesemia: replace and follow  DVT prophylaxis: heparin Code Status: full Family Communication: none at bedside - daughter 1/2 Disposition Plan: pending further improvement - safe discharge plan, need plan for long term anticoagulation with  inability to pay   Consultants:   none  Procedures:   none  Antimicrobials:  Anti-infectives (From admission, onward)   None      Subjective: No new complaints  Objective: Vitals:   07/28/19 0714 07/28/19 1204 07/28/19 1205 07/28/19 1600  BP: (!) 95/50 111/68  124/66  Pulse:   60 64  Resp:  18     Temp: 98.4 F (36.9 C)  98.3 F (36.8 C) 98 F (36.7 C)  TempSrc: Oral  Oral Oral  SpO2: 100%   100%  Weight:      Height:        Intake/Output Summary (Last 24 hours) at 07/28/2019 1808 Last data filed at 07/28/2019 0714 Gross per 24 hour  Intake 1251.2 ml  Output 400 ml  Net 851.2 ml   Filed Weights   07/26/19 2109  Weight: 96.2 kg    Examination:  General: No acute distress. Cardiovascular: RRR Lungs: Clear to auscultation bilaterally  Abdomen: Soft, nontender, nondistended  Neurological: Alert and oriented 3. Moves all extremities 4 . Cranial nerves II through XII grossly intact. Skin: Warm and dry. No rashes or lesions. Extremities: No clubbing or cyanosis. No edema.   Data Reviewed: I have personally reviewed following labs and imaging studies  CBC: Recent Labs  Lab 07/25/19 0957 07/26/19 0440 07/27/19 0240 07/28/19 0223  WBC 7.2 7.3 6.6 9.7  NEUTROABS 3.0  --  3.3 4.6  HGB 11.8* 9.6* 9.8* 11.4*  HCT 35.3* 28.0* 28.0* 32.4*  MCV 86.5 84.3 82.6 82.7  PLT 189 171 192 235   Basic Metabolic Panel: Recent Labs  Lab 07/25/19 0957 07/26/19 0440 07/27/19 0240 07/28/19 0223  NA 145  --  143 143  K 4.3  --  3.3* 4.2  CL 114*  --  112* 113*  CO2 22  --  22 22  GLUCOSE 104*  --  119* 118*  BUN 16  --  11 10  CREATININE 1.93* 1.63* 1.11* 1.22*  CALCIUM 8.0*  --  7.6* 8.0*  MG  --   --  1.4* 2.3  PHOS  --   --  2.7 2.3*   GFR: Estimated Creatinine Clearance: 52.3 mL/min ([name]) (by C-G formula based on SCr of 1.22 mg/dL (H)). Liver Function Tests: Recent Labs  Lab 07/25/19 0957 07/27/19 0240 07/28/19 0223  AST 192* 91* 87*  ALT 91* 62* 67*  ALKPHOS 118 93 112  BILITOT 0.8 1.0 1.0  PROT 5.5* 4.7* 5.6*  ALBUMIN 2.0* 2.2* 2.5*   Recent Labs  Lab 07/25/19 0957  LIPASE 14   No results for input(s): AMMONIA in the last 168 hours. Coagulation Profile: No results for input(s): INR, PROTIME in the last 168 hours. Cardiac Enzymes: No results  for input(s): CKTOTAL, CKMB, CKMBINDEX, TROPONINI in the last 168 hours. BNP (last 3 results) No results for input(s): PROBNP in the last 8760 hours. HbA1C: Recent Labs    07/27/19 0240  HGBA1C 8.7*   CBG: Recent Labs  Lab 07/27/19 1646 07/27/19 2040 07/28/19 0750 07/28/19 1130 07/28/19 1720  GLUCAP 169* 119* 155* 178* 154*   Lipid Profile: No results for input(s): CHOL, HDL, LDLCALC, TRIG, CHOLHDL, LDLDIRECT in the last 72 hours. Thyroid Function Tests: No results for input(s): TSH, T4TOTAL, FREET4, T3FREE, THYROIDAB in the last 72 hours. Anemia Panel: Recent Labs    07/27/19 0240 07/28/19 0223  FERRITIN 388* 451*   Sepsis Labs: No results for input(s): PROCALCITON, LATICACIDVEN in the last 168 hours.  Recent Results (  from the past 240 hour(s))  Novel Coronavirus, NAA ([facility])     Status: Abnormal   Collection Time: 07/25/19  8:47 AM   Specimen: Nasopharyngeal(NP) swabs in vial transport medium   NASOPHARYNGE  TESTING  Result Value Ref Range Status   SARS-CoV-2, NAA Detected ([name]) Not Detected Final    Comment: Testing was performed using the cobas(R) SARS-CoV-2 test. This nucleic acid amplification test was developed and its performance characteristics determined by [employer]. Nucleic acid amplification tests include PCR and TMA. This test has not been FDA cleared or approved. This test has been authorized by FDA under an Emergency Use Authorization (EUA). This test is only authorized for the duration of time the declaration that circumstances exist justifying the authorization of the emergency use of in vitro diagnostic tests for detection of SARS-CoV-2 virus and/or diagnosis of COVID-19 infection under section 564(b)(1) of the Act, 21 U.S.C. 536UYQ-0(H) (1), unless the authorization is terminated or revoked sooner. When diagnostic testing is negative, the possibility of [name] false negative result should be considered in the context of [name] patient's  recent exposures and the presence of clinical signs and symptoms consistent with COVID-19. An individual without symptoms  of COVID-19 and who is not shedding SARS-CoV-2 virus would expect to have Serafino Burciaga negative (not detected) result in this assay.          Radiology Studies: DG CHEST PORT 1 VIEW  Result Date: 07/27/2019 CLINICAL DATA:  hypoxia EXAM: PORTABLE CHEST 1 VIEW COMPARISON:  Chest radiograph 08/24/2018 FINDINGS: Enlargement of the cardiomediastinal contours likely secondary to AP technique. Right central venous catheter is unchanged. The lungs are clear. No pneumothorax or large pleural effusion. No acute finding in the visualized skeleton. IMPRESSION: No evidence of active disease. Electronically Signed   By: [name] M.D.   On: 07/27/2019 10:45   VAS [place] LOWER EXTREMITY VENOUS (DVT)  Result Date: 07/28/2019  Lower Venous Study Indications: Elevated Ddimer.  Risk Factors: COVID 19 positive. Limitations: Body habitus and poor ultrasound/tissue interface. Comparison Study: No prior studies. Performing Technologist: Oliver Hum RVT  Examination Guidelines: Neriah Brott complete evaluation includes B-mode imaging, spectral Doppler, color Doppler, and power Doppler as needed of all accessible portions of each vessel. Bilateral testing is considered an integral part of Meelah Tallo complete examination. Limited examinations for reoccurring indications may be performed as noted.  +---------+---------------+---------+-----------+----------+--------------+ RIGHT    CompressibilityPhasicitySpontaneityPropertiesThrombus Aging +---------+---------------+---------+-----------+----------+--------------+ CFV      Full           Yes      Yes                                 +---------+---------------+---------+-----------+----------+--------------+ SFJ      Full                                                        +---------+---------------+---------+-----------+----------+--------------+ FV  Prox  Full                                                        +---------+---------------+---------+-----------+----------+--------------+ FV Mid   Full                                                        +---------+---------------+---------+-----------+----------+--------------+  FV DistalFull                                                        +---------+---------------+---------+-----------+----------+--------------+ PFV      Full                                                        +---------+---------------+---------+-----------+----------+--------------+ POP      Full           Yes      Yes                                 +---------+---------------+---------+-----------+----------+--------------+ PTV      Full                                                        +---------+---------------+---------+-----------+----------+--------------+ PERO     Partial                                      Acute          +---------+---------------+---------+-----------+----------+--------------+   +---------+---------------+---------+-----------+----------+--------------+ LEFT     CompressibilityPhasicitySpontaneityPropertiesThrombus Aging +---------+---------------+---------+-----------+----------+--------------+ CFV      Full           Yes      Yes                                 +---------+---------------+---------+-----------+----------+--------------+ SFJ      Full                                                        +---------+---------------+---------+-----------+----------+--------------+ FV Prox  Full                                                        +---------+---------------+---------+-----------+----------+--------------+ FV Mid   Full                                                        +---------+---------------+---------+-----------+----------+--------------+ FV DistalFull                                                         +---------+---------------+---------+-----------+----------+--------------+  PFV      Full                                                        +---------+---------------+---------+-----------+----------+--------------+ POP      Full           Yes      Yes                                 +---------+---------------+---------+-----------+----------+--------------+ PTV      Full                                                        +---------+---------------+---------+-----------+----------+--------------+ PERO     Full                                                        +---------+---------------+---------+-----------+----------+--------------+     Summary: Right: Findings consistent with acute deep vein thrombosis involving the right peroneal veins. No cystic structure found in the popliteal fossa. Left: There is no evidence of deep vein thrombosis in the lower extremity. No cystic structure found in the popliteal fossa.  *See table(s) above for measurements and observations. Electronically signed by Deitra Mayo MD on 07/28/2019 at 5:05:04 PM.    Final         Scheduled Meds: . apixaban  10 mg Oral BID   Followed by  . [START ON 08/04/2019] apixaban  5 mg Oral BID  . vitamin C  500 mg Oral Daily  . Chlorhexidine Gluconate Cloth  6 each Topical Daily  . clopidogrel  75 mg Oral Daily  . darifenacin  7.5 mg Oral Daily  . dexamethasone (DECADRON) injection  6 mg Intravenous Q24H  . docusate sodium  100 mg Oral BID  . hydrocortisone cream   Topical BID  . insulin aspart  0-5 Units Subcutaneous QHS  . insulin aspart  0-9 Units Subcutaneous TID WC  . nystatin  5 mL Mouth/Throat QID  . pantoprazole  40 mg Oral Daily  . rosuvastatin  40 mg Oral QHS  . sertraline  50 mg Oral Daily  . sucralfate  1 g Oral BID  . zinc sulfate  220 mg Oral Daily   Continuous Infusions: . sodium chloride 75 mL/hr at 07/28/19 0203     LOS: 3 days    Time  spent: over 14 min    Fayrene Helper, MD [facility] Pager AMION  If 7PM-7AM, please contact night-coverage [email or web address] Password [facility] 07/28/2019, 6:08 PM

## 2019-07-28 NOTE — Discharge Instructions (Signed)
Information on my medicine - ELIQUIS (apixaban) Why was Eliquis prescribed for you? Eliquis was prescribed to treat blood clots that may have been found in the veins of your legs (deep vein thrombosis) or in your lungs (pulmonary embolism) and to reduce the risk of them occurring again.  What do You need to know about Eliquis ? The starting dose is 10 mg (two 5 mg tablets) taken TWICE daily for the FIRST SEVEN (7) DAYS, then on (enter date)  08/04/19  the dose is reduced to ONE 5 mg tablet taken TWICE daily.  Eliquis may be taken with or without food.   Try to take the dose about the same time in the morning and in the evening. If you have difficulty swallowing the tablet whole please discuss with your pharmacist how to take the medication safely.  Take Eliquis exactly as prescribed and DO NOT stop taking Eliquis without talking to the doctor who prescribed the medication.  Stopping may increase your risk of developing a new blood clot.  Refill your prescription before you run out.  After discharge, you should have regular check-up appointments with your healthcare provider that is prescribing your Eliquis.    What do you do if you miss a dose? If a dose of ELIQUIS is not taken at the scheduled time, take it as soon as possible on the same day and twice-daily administration should be resumed. The dose should not be doubled to make up for a missed dose.  Important Safety Information A possible side effect of Eliquis is bleeding. You should call your healthcare provider right away if you experience any of the following: ? Bleeding from an injury or your nose that does not stop. ? Unusual colored urine (red or dark brown) or unusual colored stools (red or black). ? Unusual bruising for unknown reasons. ? A serious fall or if you hit your head (even if there is no bleeding).  Some medicines may interact with Eliquis and might increase your risk of bleeding or clotting while on Eliquis.  To help avoid this, consult your healthcare provider or pharmacist prior to using any new prescription or non-prescription medications, including herbals, vitamins, non-steroidal anti-inflammatory drugs (NSAIDs) and supplements.  This website has more information on Eliquis (apixaban): http://www.eliquis.com/eliquis/home

## 2019-07-28 NOTE — TOC Transition Note (Signed)
Transition of Care [facility]) - CM/SW Discharge Note   Patient Details  Name: [name] MRN: [id number] Date of Birth: 02-01-1951  Transition of Care [facility]) CM/SW Contact:  Alberteen Sam, LCSW Phone Number: 07/28/2019, 10:52 AM   Clinical Narrative:     CSW spoke with patient regarding discharge planning, she is in agreement with home health services and no preference as to agency at this time. Agreeable to be faxed out for [facility] acceptance. She would like to be transported by [facility] home to limit virus exposure to family/daughter.   CSW spoke with Brittney at [facility] who is able to accept patient for PT and RN services, starting next weekend. MD made aware of delay in services, agreeable to dc plan. Barrier to identifying [facility] Agency to see patient sooner included insurance barriers.   Patient is set up with [facility] PT and RN, [facility] forms printed to unit. Home address confirmed with patient, correct on chart. Pending RN notification to call [facility] at this time.     Barriers to Discharge: No Barriers Identified   Patient Goals and CMS Choice Patient states their goals for this hospitalization and ongoing recovery are:: to go home CMS Medicare.gov Compare Post Acute Care list provided to:: Patient Choice offered to / list presented to : Patient  Discharge Placement                       Discharge Plan and Services                          HH Arranged: PT, RN [facility] Agency: [facility] Date [facility] Agency Contacted: 07/28/19 Time [place]: [id number] Representative spoke with at [place]: [place] ([place]) Interventions     Readmission Risk Interventions Readmission Risk Prevention Plan 05/16/2019  Transportation Screening Complete  Medication Review Press photographer) Complete  HRI or [place] Complete  SW Recovery Care/Counseling Consult Complete  [place]  Not Applicable  Some recent data might be hidden

## 2019-07-28 NOTE — Progress Notes (Signed)
Bilateral lower extremity venous duplex has been completed. Preliminary results can be found in CV Proc through chart review.  Results were given to the patient's nurse, Sarah.    07/28/19 11:29 AM Carlos Levering RVT

## 2019-07-29 ENCOUNTER — Inpatient Hospital Stay (HOSPITAL_COMMUNITY): Payer: Medicare HMO

## 2019-07-29 ENCOUNTER — Encounter: Payer: Self-pay | Admitting: *Deleted

## 2019-07-29 DIAGNOSIS — U071 COVID-19: Secondary | ICD-10-CM | POA: Diagnosis not present

## 2019-07-29 DIAGNOSIS — R531 Weakness: Secondary | ICD-10-CM | POA: Diagnosis not present

## 2019-07-29 DIAGNOSIS — M2550 Pain in unspecified joint: Secondary | ICD-10-CM | POA: Diagnosis not present

## 2019-07-29 DIAGNOSIS — Z7401 Bed confinement status: Secondary | ICD-10-CM | POA: Diagnosis not present

## 2019-07-29 DIAGNOSIS — R05 Cough: Secondary | ICD-10-CM | POA: Diagnosis not present

## 2019-07-29 DIAGNOSIS — R0902 Hypoxemia: Secondary | ICD-10-CM | POA: Diagnosis not present

## 2019-07-29 LAB — CBC WITH DIFFERENTIAL/PLATELET
Abs Immature Granulocytes: 0.05 10*3/uL (ref 0.00–0.07)
Basophils Absolute: 0 10*3/uL (ref 0.0–0.1)
Basophils Relative: 0 %
Eosinophils Absolute: 0.1 10*3/uL (ref 0.0–0.5)
Eosinophils Relative: 1 %
HCT: 31.5 % — ABNORMAL LOW (ref 36.0–46.0)
Hemoglobin: 10.9 g/dL — ABNORMAL LOW (ref 12.0–15.0)
Immature Granulocytes: 1 %
Lymphocytes Relative: 34 %
Lymphs Abs: 3.1 10*3/uL (ref 0.7–4.0)
MCH: 28.7 pg (ref 26.0–34.0)
MCHC: 34.6 g/dL (ref 30.0–36.0)
MCV: 82.9 fL (ref 80.0–100.0)
Monocytes Absolute: 0.8 10*3/uL (ref 0.1–1.0)
Monocytes Relative: 9 %
Neutro Abs: 5 10*3/uL (ref 1.7–7.7)
Neutrophils Relative %: 55 %
Platelets: 253 10*3/uL (ref 150–400)
RBC: 3.8 MIL/uL — ABNORMAL LOW (ref 3.87–5.11)
RDW: 16.8 % — ABNORMAL HIGH (ref 11.5–15.5)
WBC: 9 10*3/uL (ref 4.0–10.5)
nRBC: 0.2 % (ref 0.0–0.2)

## 2019-07-29 LAB — COMPREHENSIVE METABOLIC PANEL
ALT: 60 U/L — ABNORMAL HIGH (ref 0–44)
AST: 77 U/L — ABNORMAL HIGH (ref 15–41)
Albumin: 2.2 g/dL — ABNORMAL LOW (ref 3.5–5.0)
Alkaline Phosphatase: 97 U/L (ref 38–126)
Anion gap: 7 (ref 5–15)
BUN: 12 mg/dL (ref 8–23)
CO2: 23 mmol/L (ref 22–32)
Calcium: 7.9 mg/dL — ABNORMAL LOW (ref 8.9–10.3)
Chloride: 114 mmol/L — ABNORMAL HIGH (ref 98–111)
Creatinine, Ser: 1.3 mg/dL — ABNORMAL HIGH (ref 0.44–1.00)
GFR calc Af Amer: 49 mL/min — ABNORMAL LOW (ref >60)
GFR calc non Af Amer: 42 mL/min — ABNORMAL LOW (ref >60)
Glucose, Bld: 105 mg/dL — ABNORMAL HIGH (ref 70–99)
Potassium: 3.9 mmol/L (ref 3.5–5.1)
Sodium: 144 mmol/L (ref 135–145)
Total Bilirubin: 0.8 mg/dL (ref 0.3–1.2)
Total Protein: 5.2 g/dL — ABNORMAL LOW (ref 6.5–8.1)

## 2019-07-29 LAB — MAGNESIUM: Magnesium: 1.6 mg/dL — ABNORMAL LOW (ref 1.7–2.4)

## 2019-07-29 LAB — GLUCOSE, CAPILLARY
Glucose-Capillary: 97 mg/dL (ref 70–99)
Glucose-Capillary: 121 mg/dL — ABNORMAL HIGH (ref 70–99)
Glucose-Capillary: 106 mg/dL — ABNORMAL HIGH (ref 70–99)

## 2019-07-29 LAB — C-REACTIVE PROTEIN: CRP: 0.5 mg/dL (ref <1.0)

## 2019-07-29 LAB — FERRITIN: Ferritin: 368 ng/mL — ABNORMAL HIGH (ref 11–307)

## 2019-07-29 LAB — PHOSPHORUS: Phosphorus: 2.1 mg/dL — ABNORMAL LOW (ref 2.5–4.6)

## 2019-07-29 LAB — D-DIMER, QUANTITATIVE: D-Dimer, Quant: 1.47 ug/mL-FEU — ABNORMAL HIGH (ref 0.00–0.50)

## 2019-07-29 MED ORDER — HEPARIN SOD (PORK) LOCK FLUSH 10 UNIT/ML IV SOLN
10.0000 [IU] | Freq: Once | INTRAVENOUS | Status: DC
  Filled 2019-07-29: qty 1

## 2019-07-29 MED ORDER — ELIQUIS DVT/PE STARTER PACK 5 MG PO TBPK: ORAL_TABLET | ORAL | 0 refills | Status: DC

## 2019-07-29 MED ORDER — HEPARIN SOD (PORK) LOCK FLUSH 100 UNIT/ML IV SOLN
500.0000 [IU] | Freq: Once | INTRAVENOUS | Status: AC
  Administered 2019-07-29: 500 [IU] via INTRAVENOUS
  Filled 2019-07-29: qty 5

## 2019-07-29 NOTE — Consult Note (Signed)
   [facility] Inpatient Consult   07/29/2019  TANIELLE EMIGH 1951/06/19 [id number]    THN: ACTIVE status   Patient is currently active with [facility]) [place] [facility] for chronic disease management services.Patient has been engaged by [facility] Management Coordinator for DM management. Patient has 35% extreme high risk score for unplanned readmission with 3 hospitalizations and 2 ED visits in the past 6 months. He is in the [employer] ACO plan.  Our community based plan of care has focused on disease management and community resource support.  Patient will receive a post hospital call and will be evaluated for assessments and disease process education.     Per MD brief narrative 07/28/19, patient was admitted on 07/25/19 for progressive cough, dyspnea over the past week. Had a video visit with her PCP. At that time she was prescribed a z-pack. She had just about finished it up but noticed that she had no real improvement in her symptoms. She was somewhat hypotensive. Found to be in AKI and COVID+. (Covid-19 Virus Infection, Right Lower Extremity DVT of the Peroneal Veins)   Her primary care provider is Dr. Roma Schanz with [facility] at [facility], listed as providing transition of care follow-up.  Per transition of care SW note, patient is for discharge home today with home health PT/ RN (via [facility]-who will follow for HH-home health starting at the end of the week). Family to pick up the [facility] order for Eliquis tomorrow afternoon.     Plan:Will notify [facility] RN care management coordinator of discharge disposition and needs.   For additional questions or referrals please contact:  Edwena Felty A. Devanny Palecek, BSN, RN-BC [facility] Liaison Cell: [phone]

## 2019-07-29 NOTE — Telephone Encounter (Signed)
Pt currently admitted to hospital

## 2019-07-29 NOTE — TOC Benefit Eligibility Note (Signed)
Transition of Care [facility]) Benefit Eligibility Note    Patient Details  Name: [name] MRN: [id number] Date of Birth: 02-07-1951   Medication/Dose: [name]   5 MG BID  Covered?: Yes     Prescription Coverage [facility]  Spoke with Person/Company/Phone Number:: TREVON  @  [employer]  [employer] #  [phone] OPT- MEMBER  Co-Pay: $ 157.31  Prior Approval: No  Deductible: Unmet       Memory Argue Phone Number: 07/29/2019, 10:01 AM

## 2019-07-29 NOTE — Plan of Care (Signed)

## 2019-07-29 NOTE — Discharge Summary (Signed)
Physician Discharge Summary  CONCHA SUDOL OAC:[id number] DOB: May 17, 1951 DOA: 07/25/2019  PCP: Ann Held, DO  Admit date: 07/25/2019 Discharge date: 07/30/2019  Time spent: 40 minutes  Recommendations for Outpatient Follow-up:  1. Follow outpatient CBC/CMP 2. Quarantine until January 21.   3. Started on eliquis for DVT.  It's prohibitively expensive for her.  Dr. Marin Olp agreed to see in follow up.  Going to look into financial assistance.  Discussed importance of following up with Mrs. Hernan and not allowing anticoagulation to be interrupted.   4. Aspirin discontinued, continue plavix with eliquis 5. Follow LFT's outpatient   Discharge Diagnoses:  Principal Problem:   COVID-19 virus infection Active Problems:   Type 2 diabetes with nephropathy (HCC)   CKD (chronic kidney disease) stage 3, GFR 30-59 ml/min (HCC)   Hypotension   AKI (acute kidney injury) ([place])   Elevated LFTs   COVID-19   Discharge Condition: stable  Diet recommendation: heart healthy  Filed Weights   07/26/19 2109  Weight: 96.2 kg    History of present illness:  [name] a68 y.o.femalewithpastmedical history significantforDM2, HLD, Asthmawho presented to the ED for progressive cough, dyspnea over the past week.OTCs had not helped and it seemed out of the ordinary for her. She denies any aggravating factors. She had Kassy Mcenroe video visit with her PCP. At that time she was prescribed Ellyanna Holton z-pack. She had just about finished it up but noticed that she had no real improvement in her symptoms.  ED Course:CXR showed no disease. She was somewhat hypotensive. Found to be in AKI and COVID+. TRH was called for admission.  She was admitted for COVID 19 virus infection  She did not have any pneumonia on chest x ray or hypoxia.  She had AKI on presentation which improved throughout the admission.  Hospitalization c/b RLE DVT and she was started on eliquis.  Discussed need to quarantine for 21 days after  positive test.  Hospital Course:  Covid-19 Virus Infection Currently on RA.  She was treated outpatient for cough, dyspnea with azithromycin. CXR 07/27/19 without active disease. D/c steroids with no hypoxia No indication at this time for remdesivir I/O, daily weights IS, OOB Temp to 100.1 this AM, CXR without acute abnormality.  Continue to monitor.   Discussed need to quarantine until Jan 21.  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  Will need 30 day free card.  Prohibitively expensive and she doesn't think family will be able to help.  Will need to reach out to outpatient providers to ensure good follow up and plan for continued anticoagulation - discussed with Dr. Marin Olp who was kind enough to agree to see her in follow up and try to help her get eliquis outpatient.  Needs at least 3 months (seems like this is first event per her report, provoked).   As I was discussing discharge plan with daughter, she noted the pharmacy told her they were out of eliquis until tomorrow afternoon.  On discussion with nurse, she's been given Maya Arcand dose for tonight and tomorrow morning to go home with.  Her daughter will pick up afternoon dose tomorrow  from pharmacy.  CAD s/p Stent 08/2018:  Was on DAPT after stent placed 08/2018 to mid L circumflex.  Discussed with cards given initiation of anticoagulation for DVT above.  Will stop aspirin and continue eliquis/plavix, cards agreed.    AKI on CKD stage IIIb Patient presenting with elevated creatinine of 1.93 with Alvina Strother baseline roughly 1.4. Etiology likely secondary to decreased oral intake, dehydration;from underlying Covid-19 viral  infection. --Cr 1.93-->1.63 --> 1.11 on 07/27/19 --1.3 today, follow, relatively stable  Elevated LFTs AST and ALT elevated at 192 and 91 respectively. EtOH level less than 10.Bilirubin within normal limits. Acute hepatitis panel negative. Suspect etiology related to hepatic impairment from underlying Covid-19 versus possible suspicion for EtOH abuse givenAST/ALT ratio. --irelatively stable, follow  Hypotension: Resolved On initial presentation, BP 62/52, likely secondary to dehydration. Responded well to IV fluid hydration. Blood pressure now up to 123/78 today. --Continue monitor blood pressure closely  Type 2 diabetes mellitus Last hemoglobin A1c 10.0 on 05/13/2019. Home regimen includes Metformin 500 mg BID. --Check hemoglobin A1c (8.7) --Insulin sliding scale for coverage --CBGs q4h qAC/HS  Recurrent Falls: she notes she's been falling more often, last about 2 weeks ago.  Will have PT work with her to determine ability to go home safely (vs need for rehab) Therapy evaluation - home health   Hypokalemia  Hypomagnesemia: replace and follow  Procedures: LE [place] Summary: Right: Findings consistent with acute deep vein thrombosis involving the right peroneal veins. No cystic structure found in the popliteal fossa. Left: There is no evidence of deep vein thrombosis in the lower extremity. No cystic structure found in the popliteal fossa.  Consultations:  none  Discharge Exam: Vitals:   07/29/19 1430 07/29/19 2000  BP: (!) 107/55 101/70  Pulse: 92 86  Resp:  17  Temp: 98.6 F (37 C) 98.5 F (36.9 C)  SpO2: 100% 99%   Feels well, ready for discharge Tamotsu Wiederholt little cough todasy No SOB  General: No acute distress. Cardiovascular: Heart sounds show Kenta Laster regular rate, and rhythm Lungs: Clear to auscultation bilaterally Abdomen: Soft, nontender, nondistended  Neurological: Alert and oriented 3. Moves all extremities 4. Cranial nerves II through XII grossly  intact. Skin: Warm and dry. No rashes or lesions. Extremities: No clubbing or cyanosis. No edema.  Discharge Instructions   Discharge Instructions    MyChart COVID-19 home monitoring program   Complete by: Jul 29, 2019    Is the patient willing to use the [place] for home monitoring?: Yes   Temperature monitoring   Complete by: Jul 29, 2019    After how many days would you like to receive Elic Vencill notification of this patient's flowsheet entries?: 1   Diet - low sodium heart healthy   Complete by: As directed    Diet - low sodium heart healthy   Complete by: As directed    Discharge instructions   Complete by: As directed    You were seen for COVID 19 viral infection.  Your chest x ray was negative and you're doing well without any extra oxygen.    You were found to have Jillian Warth DVT.  We've started you on eliquis.  You're on day 2 of the starter pack today.  We'll send you home with the 30 day free card.  It's important that you pick up the prescription and continue your doses tonight.  I called Dr. Marin Olp and he's going to see if he can help with your anticoagulation outpatient.  Please call his office  to arrange follow up and to follow up with getting Kenndra Morris refill for your eliquis.  If you're unable to fill the eliquis before it runs out, you'll need to discuss another alternative medication (or if you run out without any refills, you should return to the hospital to treat your DVT).  While you've been started on the eliquis, stop your aspirin.  Continue the eliquis together with the plavix.   Return for new, recurrent, or worsening symptoms.  Please ask your PCP to request records from this hospitalization so they know what was done and what the next steps will be.   Increase activity slowly   Complete by: As directed    Increase activity slowly   Complete by: As directed      Allergies as of 07/29/2019      Reactions   Adhesive [tape] Other (See Comments)   Burn Skin   Codeine Other  (See Comments)   paranoid   Zocor [simvastatin - High Dose] Other (See Comments)   Muscle spasms   Penicillins Rash   Has patient had Jesselee Poth PCN reaction causing immediate rash, facial/tongue/throat swelling, SOB or lightheadedness with hypotension: Yes Has patient had Georgia Baria PCN reaction causing severe rash involving mucus membranes or skin necrosis: No Has patient had Berklee Battey PCN reaction that required hospitalization does not remember.  Has patient had Inara Dike PCN reaction occurring within the last 10 years: childhood If all of the above answers are "NO", then may proceed with Cephalosporin use.      Medication List    STOP taking these medications   aspirin EC 81 MG tablet   azithromycin 250 MG tablet Commonly known as: Zithromax Z-Pak   Insulin Degludec 200 UNIT/ML Sopn Commonly known as: Tyler Aas FlexTouch   Ozempic (0.25 or 0.5 MG/DOSE) 2 MG/1.5ML Sopn Generic drug: Semaglutide(0.25 or 0.5MG/DOS)     TAKE these medications   albuterol (2.5 MG/3ML) 0.083% nebulizer solution Commonly known as: PROVENTIL Take 3 mLs (2.5 mg total) by nebulization every 6 (six) hours as needed for wheezing or shortness of breath.   albuterol 108 (90 Base) MCG/ACT inhaler Commonly known as: VENTOLIN HFA Inhale 2 puffs into the lungs every 6 (six) hours as needed for wheezing or shortness of breath.   B-D UF III MINI PEN NEEDLES 31G X 5 MM Misc Generic drug: Insulin Pen Needle Use to inject insulin once Breann Losano day.   benzonatate 100 MG capsule Commonly known as: TESSALON Take 1 capsule (100 mg total) by mouth 3 (three) times daily as needed for cough.   Centrum Silver Adult 50+ Tabs Take 1 tablet once Haniah Penny day   clopidogrel 75 MG tablet Commonly known as: PLAVIX Take 1 tablet (75 mg total) by mouth daily.   darifenacin 7.5 MG 24 hr tablet Commonly known as: ENABLEX Take 7.5 mg by mouth daily.   Eliquis DVT/PE Starter Pack 5 MG Tbpk Generic drug: Apixaban Starter Pack Take as directed on package: start with  two-12m tablets twice daily for 7 days. On day 8, switch to one-543mtablet twice daily.   famotidine 40 MG tablet Commonly known as: Pepcid Take 1 tablet (40 mg total) by mouth daily.   gabapentin 800 MG tablet Commonly known as: NEURONTIN TAKE ONE TABLET BY MOUTH THREE TIMES DAILY ;;   glucose blood test strip Commonly known as: OneTouch Verio Use as instructed to check blood sugar 3 times Lavaris Sexson day   Hyoscyamine Sulfate SL 0.125 MG Subl Commonly known as: Levsin/SL 1-2 SL q4h prn  diarrhea   ipratropium-albuterol 0.5-2.5 (3) MG/3ML Soln Commonly known as: DUONEB Take 3 mLs by nebulization every 6 (six) hours as needed.   isosorbide mononitrate 30 MG 24 hr tablet Commonly known as: IMDUR TAKE ONE HALF TABLET BY MOUTH EVERY DAY PATIENT NEEDS APPOINTMENT BEFORE NEXT REFILL. What changed: See the new instructions.   lidocaine-prilocaine cream Commonly known as: EMLA Apply 1 application topically as needed. What changed: reasons to take this   metFORMIN 500 MG tablet Commonly known as: GLUCOPHAGE Take 1 tablet (500 mg total) by mouth 2 (two) times daily with Indigo Chaddock meal.   metoprolol tartrate 25 MG tablet Commonly known as: LOPRESSOR Take 1 tablet (25 mg total) by mouth 2 (two) times daily.   nitroGLYCERIN 0.4 MG SL tablet Commonly known as: Nitrostat Place 1 tablet (0.4 mg total) under the tongue every 5 (five) minutes as needed. What changed: reasons to take this   nystatin 100000 UNIT/ML suspension Commonly known as: MYCOSTATIN 5 ml po swish and spit qid What changed:   how much to take  how to take this  when to take this  additional instructions   ondansetron 4 MG tablet Commonly known as: ZOFRAN TAKE ONE TABLET BY MOUTH EVERY EIGHT HOURS AS NEEDED FOR NAUSEA AND VOMITING   pantoprazole 40 MG tablet Commonly known as: PROTONIX Take 1 tablet (40 mg total) by mouth daily.   rosuvastatin 40 MG tablet Commonly known as: CRESTOR TAKE ONE TABLET BY MOUTH EVERY  DAY AT BEDTIME What changed: when to take this   sertraline 50 MG tablet Commonly known as: ZOLOFT Take 1 tablet (50 mg total) by mouth daily.   sucralfate 1 g tablet Commonly known as: CARAFATE TAKE 1 TABLET BY MOUTH TWICE DAILY   traMADol 50 MG tablet Commonly known as: ULTRAM TAKE TWO TABLETS BY MOUTH THREE TIMES DAILY AS NEEDED FOR MODERATE PAIN What changed:   how much to take  how to take this  when to take this  reasons to take this  additional instructions   traZODone 50 MG tablet Commonly known as: DESYREL Take 0.5-1 tablets (25-50 mg total) by mouth at bedtime as needed for sleep.      Allergies  Allergen Reactions  . Adhesive [Tape] Other (See Comments)    Burn Skin  . Codeine Other (See Comments)    paranoid  . Zocor [Simvastatin - High Dose] Other (See Comments)    Muscle spasms  . Penicillins Rash    Has patient had Stacye Noori PCN reaction causing immediate rash, facial/tongue/throat swelling, SOB or lightheadedness with hypotension: Yes Has patient had Darleen Moffitt PCN reaction causing severe rash involving mucus membranes or skin necrosis: No Has patient had Mikyla Schachter PCN reaction that required hospitalization does not remember.  Has patient had Ifeoluwa Beller PCN reaction occurring within the last 10 years: childhood If all of the above answers are "NO", then may proceed with Cephalosporin use.       The results of significant diagnostics from this hospitalization (including imaging, microbiology, ancillary and laboratory) are listed below for reference.    Significant Diagnostic Studies: DG CHEST PORT 1 VIEW  Result Date: 07/29/2019 CLINICAL DATA:  Hypoxia EXAM: PORTABLE CHEST 1 VIEW COMPARISON:  07/27/2019 FINDINGS: The heart size and mediastinal contours are within normal limits. Right chest port catheter. Both lungs are clear. The visualized skeletal structures are unremarkable. IMPRESSION: No acute abnormality of the lungs in AP portable projection. Electronically Signed   By:  Eddie Candle M.D.   On: 07/29/2019  08:10   DG CHEST PORT 1 VIEW  Result Date: 07/27/2019 CLINICAL DATA:  hypoxia EXAM: PORTABLE CHEST 1 VIEW COMPARISON:  Chest radiograph 08/24/2018 FINDINGS: Enlargement of the cardiomediastinal contours likely secondary to AP technique. Right central venous catheter is unchanged. The lungs are clear. No pneumothorax or large pleural effusion. No acute finding in the visualized skeleton. IMPRESSION: No evidence of active disease. Electronically Signed   By: Audie Pinto M.D.   On: 07/27/2019 10:45   DG Chest Port 1 View  Result Date: 07/25/2019 CLINICAL DATA:  Fever, chills EXAM: PORTABLE CHEST 1 VIEW COMPARISON:  05/14/2019 FINDINGS: Right Port-Zyshawn Bohnenkamp-Cath in place, unchanged. Heart and mediastinal contours are within normal limits. No focal opacities or effusions. No acute bony abnormality. IMPRESSION: No active disease. Electronically Signed   By: Rolm Baptise M.D.   On: 07/25/2019 11:44   VAS [place] LOWER EXTREMITY VENOUS (DVT)  Result Date: 07/28/2019  Lower Venous Study Indications: Elevated Ddimer.  Risk Factors: COVID 19 positive. Limitations: Body habitus and poor ultrasound/tissue interface. Comparison Study: No prior studies. Performing Technologist: Oliver Hum RVT  Examination Guidelines: Nichael Ehly complete evaluation includes B-mode imaging, spectral Doppler, color Doppler, and power Doppler as needed of all accessible portions of each vessel. Bilateral testing is considered an integral part of Yoshimi Sarr complete examination. Limited examinations for reoccurring indications may be performed as noted.  +---------+---------------+---------+-----------+----------+--------------+ RIGHT    CompressibilityPhasicitySpontaneityPropertiesThrombus Aging +---------+---------------+---------+-----------+----------+--------------+ CFV      Full           Yes      Yes                                 +---------+---------------+---------+-----------+----------+--------------+  SFJ      Full                                                        +---------+---------------+---------+-----------+----------+--------------+ FV Prox  Full                                                        +---------+---------------+---------+-----------+----------+--------------+ FV Mid   Full                                                        +---------+---------------+---------+-----------+----------+--------------+ FV DistalFull                                                        +---------+---------------+---------+-----------+----------+--------------+ PFV      Full                                                        +---------+---------------+---------+-----------+----------+--------------+  POP      Full           Yes      Yes                                 +---------+---------------+---------+-----------+----------+--------------+ PTV      Full                                                        +---------+---------------+---------+-----------+----------+--------------+ PERO     Partial                                      Acute          +---------+---------------+---------+-----------+----------+--------------+   +---------+---------------+---------+-----------+----------+--------------+ LEFT     CompressibilityPhasicitySpontaneityPropertiesThrombus Aging +---------+---------------+---------+-----------+----------+--------------+ CFV      Full           Yes      Yes                                 +---------+---------------+---------+-----------+----------+--------------+ SFJ      Full                                                        +---------+---------------+---------+-----------+----------+--------------+ FV Prox  Full                                                        +---------+---------------+---------+-----------+----------+--------------+ FV Mid   Full                                                         +---------+---------------+---------+-----------+----------+--------------+ FV DistalFull                                                        +---------+---------------+---------+-----------+----------+--------------+ PFV      Full                                                        +---------+---------------+---------+-----------+----------+--------------+ POP      Full           Yes      Yes                                 +---------+---------------+---------+-----------+----------+--------------+  PTV      Full                                                        +---------+---------------+---------+-----------+----------+--------------+ PERO     Full                                                        +---------+---------------+---------+-----------+----------+--------------+     Summary: Right: Findings consistent with acute deep vein thrombosis involving the right peroneal veins. No cystic structure found in the popliteal fossa. Left: There is no evidence of deep vein thrombosis in the lower extremity. No cystic structure found in the popliteal fossa.  *See table(s) above for measurements and observations. Electronically signed by Deitra Mayo MD on 07/28/2019 at 5:05:04 PM.    Final     Microbiology: Recent Results (from the past 240 hour(s))  Novel Coronavirus, NAA ([facility])     Status: Abnormal   Collection Time: 07/25/19  8:47 AM   Specimen: Nasopharyngeal(NP) swabs in vial transport medium   NASOPHARYNGE  TESTING  Result Value Ref Range Status   SARS-CoV-2, NAA Detected (Caitlin Ainley) Not Detected Final    Comment: Testing was performed using the cobas(R) SARS-CoV-2 test. This nucleic acid amplification test was developed and its performance characteristics determined by [employer]. Nucleic acid amplification tests include PCR and TMA. This test has not been FDA cleared or approved. This test has been authorized by  FDA under an Emergency Use Authorization (EUA). This test is only authorized for the duration of time the declaration that circumstances exist justifying the authorization of the emergency use of in vitro diagnostic tests for detection of SARS-CoV-2 virus and/or diagnosis of COVID-19 infection under section 564(b)(1) of the Act, 21 U.S.C. 789FYB-0(F) (1), unless the authorization is terminated or revoked sooner. When diagnostic testing is negative, the possibility of Cornella Emmer false negative result should be considered in the context of Lurine Imel patient's recent exposures and the presence of clinical signs and symptoms consistent with COVID-19. An individual without symptoms  of COVID-19 and who is not shedding SARS-CoV-2 virus would expect to have Kirbie Stodghill negative (not detected) result in this assay.      Labs: Basic Metabolic Panel: Recent Labs  Lab 07/25/19 0957 07/26/19 0440 07/27/19 0240 07/28/19 0223 07/29/19 0155  NA 145  --  143 143 144  K 4.3  --  3.3* 4.2 3.9  CL 114*  --  112* 113* 114*  CO2 22  --  22 22 23   GLUCOSE 104*  --  119* 118* 105*  BUN 16  --  11 10 12   CREATININE 1.93* 1.63* 1.11* 1.22* 1.30*  CALCIUM 8.0*  --  7.6* 8.0* 7.9*  MG  --   --  1.4* 2.3 1.6*  PHOS  --   --  2.7 2.3* 2.1*   Liver Function Tests: Recent Labs  Lab 07/25/19 0957 07/27/19 0240 07/28/19 0223 07/29/19 0155  AST 192* 91* 87* 77*  ALT 91* 62* 67* 60*  ALKPHOS 118 93 112 97  BILITOT 0.8 1.0 1.0 0.8  PROT 5.5* 4.7* 5.6* 5.2*  ALBUMIN 2.0* 2.2* 2.5* 2.2*   Recent Labs  Lab 07/25/19 0957  LIPASE 14   No results for input(s): AMMONIA in the last 168 hours. CBC: Recent Labs  Lab 07/25/19 0957 07/26/19 0440 07/27/19 0240 07/28/19 0223 07/29/19 0155  WBC 7.2 7.3 6.6 9.7 9.0  NEUTROABS 3.0  --  3.3 4.6 5.0  HGB 11.8* 9.6* 9.8* 11.4* 10.9*  HCT 35.3* 28.0* 28.0* 32.4* 31.5*  MCV 86.5 84.3 82.6 82.7 82.9  PLT 189 171 192 238 253   Cardiac Enzymes: No results for input(s): CKTOTAL,  CKMB, CKMBINDEX, TROPONINI in the last 168 hours. BNP: BNP (last 3 results) No results for input(s): BNP in the last 8760 hours.  ProBNP (last 3 results) No results for input(s): PROBNP in the last 8760 hours.  CBG: Recent Labs  Lab 07/28/19 1720 07/28/19 2119 07/29/19 0719 07/29/19 1139 07/29/19 1650  GLUCAP 154* 125* 97 121* 106*       Signed:  Fayrene Helper MD.  [facility] 07/30/2019, 7:29 AM

## 2019-07-29 NOTE — TOC Transition Note (Signed)
Transition of Care [facility]) - CM/SW Discharge Note   Patient Details  Name: [name] MRN: [id number] Date of Birth: 02/15/1951  [facility] CM/SW Contact:  Shade Flood, LCSW Phone Number: 07/29/2019, 4:16 PM   Clinical Narrative:      Pt stable to dc home today. 30 day coupon codes for Eliquis [facility] The coupon was applied and pt will not have a charge for this first time order. However, the [facility] will have to [facility] and will not have it until tomorrow afternoon. Updated pt's RN who is working to address the issue of the dose due tonight. Family to pick up the [facility] order tomorrow.  Called [facility] for transport. [facility]ified [facility] of [facility] and [facility] will follow for [facility] starting at the end of the week.  There are no other TOC needs for dc.    Barriers to Discharge: No Barriers Identified   Patient Goals and CMS Choice Patient states their goals for this hospitalization and ongoing recovery are:: to go home CMS Medicare.gov Compare Post Acute Care list provided to:: Patient Choice offered to / list presented to : Patient  Discharge Placement                       Discharge Plan and Services                          HH Arranged: PT, RN [facility] Agency: [facility] Date [facility] Agency Contacted: 07/28/19 Time [place]: 0223 Representative spoke with at [place]: [place] Interventions     Readmission Risk Interventions Readmission Risk Prevention Plan 05/16/2019  Transportation Screening Complete  Medication Review Press photographer) Complete  HRI or [place] Complete  SW Recovery Care/Counseling Consult Complete  [place] Not Applicable  Some recent data might be hidden

## 2019-07-29 NOTE — Progress Notes (Unsigned)
Dr Marin Olp received call from physician taking care of [name] in [place].  Now with a blood clot.  Patient needs Eliquis however cant afford the copay.  Message sent to [name] to arrange financial assistance.

## 2019-07-29 NOTE — Progress Notes (Addendum)
VAST consulted to de-access port for pt discharge. Pt's nurse called [facility] RN and asked if staff was at [facility]. Explained that PICC team was there about 30 mins ago, but they no longer appear to be present at [facility]. Advised pt's nurse this RN will do some research and get back to her.  Called charge nurse and asked if she had been trained to de-access port. She stated she had taken the class, but did not think they had the appropriate heparin flush to use for port de-access. Making call to pharmacy to check and will call [facility] with answer. PICC RN responded and is still on campus; will de-access port.  Called unit RN and advised PICC RN will take care of flushing and de-accessing port.

## 2019-07-29 NOTE — Progress Notes (Signed)
PROGRESS NOTE    [name]  PRF:[id number] DOB: 1950/09/15 DOA: 07/25/2019 PCP: Ann Held, DO   Brief Narrative:  [name] is [name] 69 y.o.femalewith past medical history significant forDM2, HLD, Asthma who presented to the ED for progressive cough, dyspnea over the past week. OTCs had not helped and it seemed out of the ordinary for her. She denies any aggravating factors. She had Tanor Glaspy video visit with her PCP. At that time she was prescribed Genell Thede z-pack. She had just about finished it up but noticed that she had no real improvement in her symptoms.  ED Course:CXR showed no disease. She was somewhat hypotensive. Found to be in AKI and COVID+. TRH was called for admission.   Assessment & Plan:   Principal Problem:   COVID-19 virus infection Active Problems:   Type 2 diabetes with nephropathy (HCC)   CKD (chronic kidney disease) stage 3, GFR 30-59 ml/min (HCC)   Hypotension   AKI (acute kidney injury) ([place])   Elevated LFTs   COVID-19  Covid-19 Virus Infection Currently on RA.  She was treated outpatient for cough, dyspnea with azithromycin. CXR 07/27/19 without active disease. 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  Will need 30 day free card.  Prohibitively expensive and she doesn't think family will be able to help.  Will need to reach out to outpatient providers to ensure good follow up and plan for continued anticoagulation - discussed with Dr. Marin Olp who was kind enough to agree to see her in follow up and  try to help her get eliquis outpatient.  Needs at least 3 months. As I was discussing discharge plan with daughter, she noted the pharmacy told her they were out of eliquis until tomorrow afternoon.   Plan for discharge 1/5 AM  CAD s/p Stent 08/2018:  Was on DAPT after stent placed 08/2018 to mid L circumflex.  Discussed with cards given initiation of anticoagulation for DVT above.  Will stop aspirin and continue eliquis/plavix, cards agreed.    AKI on CKD stage IIIb Patient presenting with elevated creatinine of 1.93 with Blen Ransome baseline roughly 1.4.  Etiology likely secondary to decreased oral intake, dehydration; from underlying Covid-19 viral infection. --Cr 1.93-->1.63 --> 1.11 on 07/27/19 --1.3 today, follow, relatively stable  Elevated LFTs AST and ALT elevated at 192 and 91 respectively.  EtOH level less than 10.  Bilirubin within normal limits.  Acute hepatitis panel negative.  Suspect etiology related to hepatic impairment from underlying Covid-19 versus possible suspicion for EtOH abuse given AST/ALT ratio. --irelatively stable, follow  Hypotension: Resolved On initial presentation, BP 62/52, likely secondary to dehydration.  Responded well to IV fluid hydration.  Blood pressure now up to 123/78 today. --Continue monitor blood pressure closely  Type 2 diabetes mellitus Last hemoglobin A1c 10.0 on 05/13/2019.  Home regimen includes Metformin 500 mg BID. --Check hemoglobin A1c (8.7) --Insulin sliding scale for coverage --CBGs q4h qAC/HS  Recurrent Falls: she notes she's been falling more often, last about 2 weeks  ago.  Will have PT work with her to determine ability to go home safely (vs need for rehab) Therapy evaluation - home health   Hypokalemia  Hypomagnesemia: replace and follow  DVT prophylaxis: heparin Code Status: full Family Communication: none at bedside - daughter 44/4 Disposition Plan: pending further improvement - safe discharge plan, need plan for long term  anticoagulation with inability to pay   Consultants:   none  Procedures:   none  Antimicrobials:  Anti-infectives (From admission, onward)   None      Subjective: Cough.  Green phlegm.   Objective: Vitals:   07/29/19 0400 07/29/19 0725 07/29/19 1142 07/29/19 1430  BP: 114/77 115/74 104/71 (!) 107/55  Pulse: 74 83 (!) 107 92  Resp: 15 20    Temp: 99.1 F (37.3 C) 100.1 F (37.8 C) 99.1 F (37.3 C) 98.6 F (37 C)  TempSrc: Oral Oral Oral Oral  SpO2: 99% 99% 100% 100%  Weight:      Height:        Intake/Output Summary (Last 24 hours) at 07/29/2019 2005 Last data filed at 07/29/2019 1620 Gross per 24 hour  Intake 2615.52 ml  Output 2100 ml  Net 515.52 ml   Filed Weights   07/26/19 2109  Weight: 96.2 kg    Examination:  General: No acute distress. Cardiovascular: RRR Lungs: Clear to auscultation bilaterally  Abdomen: Soft, nontender, nondistended  Neurological: Alert and oriented 3. Moves all extremities 4 . Cranial nerves II through XII grossly intact. Skin: Warm and dry. No rashes or lesions. Extremities: No clubbing or cyanosis. No edema.  Data Reviewed: I have personally reviewed following labs and imaging studies  CBC: Recent Labs  Lab 07/25/19 0957 07/26/19 0440 07/27/19 0240 07/28/19 0223 07/29/19 0155  WBC 7.2 7.3 6.6 9.7 9.0  NEUTROABS 3.0  --  3.3 4.6 5.0  HGB 11.8* 9.6* 9.8* 11.4* 10.9*  HCT 35.3* 28.0* 28.0* 32.4* 31.5*  MCV 86.5 84.3 82.6 82.7 82.9  PLT 189 171 192 238 366   Basic Metabolic Panel: Recent Labs  Lab 07/25/19 0957 07/26/19 0440 07/27/19 0240 07/28/19 0223 07/29/19 0155  NA 145  --  143 143 144  K 4.3  --  3.3* 4.2 3.9  CL 114*  --  112* 113* 114*  CO2 22  --  22 22 23   GLUCOSE 104*  --  119* 118* 105*  BUN 16  --  11 10 12   CREATININE 1.93* 1.63* 1.11* 1.22* 1.30*  CALCIUM 8.0*  --  7.6* 8.0* 7.9*  MG  --   --  1.4* 2.3 1.6*  PHOS  --   --  2.7 2.3* 2.1*   GFR: Estimated Creatinine Clearance: 49.1  mL/min (Anner Baity) (by C-G formula based on SCr of 1.3 mg/dL (H)). Liver Function Tests: Recent Labs  Lab 07/25/19 0957 07/27/19 0240 07/28/19 0223 07/29/19 0155  AST 192* 91* 87* 77*  ALT 91* 62* 67* 60*  ALKPHOS 118 93 112 97  BILITOT 0.8 1.0 1.0 0.8  PROT 5.5* 4.7* 5.6* 5.2*  ALBUMIN 2.0* 2.2* 2.5* 2.2*   Recent Labs  Lab 07/25/19 0957  LIPASE 14   No results for input(s): AMMONIA in the last 168 hours. Coagulation Profile: No results for input(s): INR, PROTIME in the last 168 hours. Cardiac Enzymes: No results for input(s): CKTOTAL, CKMB, CKMBINDEX, TROPONINI in the last 168 hours. BNP (last 3 results) No results for input(s): PROBNP in the last 8760 hours. HbA1C: Recent Labs    07/27/19 0240  HGBA1C 8.7*   CBG: Recent Labs  Lab 07/28/19 1720 07/28/19 2119 07/29/19 0719 07/29/19 1139 07/29/19 1650  GLUCAP 154* 125* 97 121* 106*   Lipid Profile: No results for input(s): CHOL, HDL, LDLCALC, TRIG, CHOLHDL, LDLDIRECT in the last 72 hours. Thyroid Function Tests: No results for input(s): TSH, T4TOTAL, FREET4, T3FREE, THYROIDAB in the last 72 hours. Anemia Panel: Recent Labs    07/28/19 0223 07/29/19 0155  FERRITIN 451* 368*   Sepsis Labs: No results for input(s): PROCALCITON, LATICACIDVEN in the last 168 hours.  Recent Results (from the past 240 hour(s))  Novel Coronavirus, NAA ([facility])     Status: Abnormal   Collection Time: 07/25/19  8:47 AM   Specimen: Nasopharyngeal(NP) swabs in vial transport medium   NASOPHARYNGE  TESTING  Result Value Ref Range Status   SARS-CoV-2, NAA Detected (Demiana Crumbley) Not Detected Final    Comment: Testing was performed using the cobas(R) SARS-CoV-2 test. This nucleic acid amplification test was developed and its performance characteristics determined by [employer]. Nucleic acid amplification tests include PCR and TMA. This test has not been FDA cleared or approved. This test has been authorized by FDA under an Emergency Use  Authorization (EUA). This test is only authorized for the duration of time the declaration that circumstances exist justifying the authorization of the emergency use of in vitro diagnostic tests for detection of SARS-CoV-2 virus and/or diagnosis of COVID-19 infection under section 564(b)(1) of the Act, 21 U.S.C. 283MOQ-9(U) (1), unless the authorization is terminated or revoked sooner. When diagnostic testing is negative, the possibility of Tashianna Broome false negative result should be considered in the context of Taedyn Glasscock patient's recent exposures and the presence of clinical signs and symptoms consistent with COVID-19. An individual without symptoms  of COVID-19 and who is not shedding SARS-CoV-2 virus would expect to have Adaiah Jaskot negative (not detected) result in this assay.          Radiology Studies: DG CHEST PORT 1 VIEW  Result Date: 07/29/2019 CLINICAL DATA:  Hypoxia EXAM: PORTABLE CHEST 1 VIEW COMPARISON:  07/27/2019 FINDINGS: The heart size and mediastinal contours are within normal limits. Right chest port catheter. Both lungs are clear. The visualized skeletal structures are unremarkable. IMPRESSION: No acute abnormality of the lungs in AP portable projection. Electronically Signed   By: Eddie Candle M.D.   On: 07/29/2019 08:10   VAS [place] LOWER EXTREMITY VENOUS (DVT)  Result Date: 07/28/2019  Lower Venous Study Indications: Elevated Ddimer.  Risk Factors: COVID 19 positive. Limitations: Body habitus and poor ultrasound/tissue interface. Comparison Study: No prior studies. Performing Technologist: Oliver Hum RVT  Examination Guidelines: Iniko Robles complete evaluation includes B-mode imaging, spectral Doppler, color Doppler, and power Doppler as needed of all accessible portions of each vessel. Bilateral testing is considered an integral part of Kobee Medlen complete examination. Limited examinations for reoccurring indications may be performed as noted.   +---------+---------------+---------+-----------+----------+--------------+ RIGHT    CompressibilityPhasicitySpontaneityPropertiesThrombus Aging +---------+---------------+---------+-----------+----------+--------------+ CFV      Full           Yes      Yes                                 +---------+---------------+---------+-----------+----------+--------------+ SFJ      Full                                                        +---------+---------------+---------+-----------+----------+--------------+  FV Prox  Full                                                        +---------+---------------+---------+-----------+----------+--------------+ FV Mid   Full                                                        +---------+---------------+---------+-----------+----------+--------------+ FV DistalFull                                                        +---------+---------------+---------+-----------+----------+--------------+ PFV      Full                                                        +---------+---------------+---------+-----------+----------+--------------+ POP      Full           Yes      Yes                                 +---------+---------------+---------+-----------+----------+--------------+ PTV      Full                                                        +---------+---------------+---------+-----------+----------+--------------+ PERO     Partial                                      Acute          +---------+---------------+---------+-----------+----------+--------------+   +---------+---------------+---------+-----------+----------+--------------+ LEFT     CompressibilityPhasicitySpontaneityPropertiesThrombus Aging +---------+---------------+---------+-----------+----------+--------------+ CFV      Full           Yes      Yes                                  +---------+---------------+---------+-----------+----------+--------------+ SFJ      Full                                                        +---------+---------------+---------+-----------+----------+--------------+ FV Prox  Full                                                        +---------+---------------+---------+-----------+----------+--------------+  FV Mid   Full                                                        +---------+---------------+---------+-----------+----------+--------------+ FV DistalFull                                                        +---------+---------------+---------+-----------+----------+--------------+ PFV      Full                                                        +---------+---------------+---------+-----------+----------+--------------+ POP      Full           Yes      Yes                                 +---------+---------------+---------+-----------+----------+--------------+ PTV      Full                                                        +---------+---------------+---------+-----------+----------+--------------+ PERO     Full                                                        +---------+---------------+---------+-----------+----------+--------------+     Summary: Right: Findings consistent with acute deep vein thrombosis involving the right peroneal veins. No cystic structure found in the popliteal fossa. Left: There is no evidence of deep vein thrombosis in the lower extremity. No cystic structure found in the popliteal fossa.  *See table(s) above for measurements and observations. Electronically signed by Deitra Mayo MD on 07/28/2019 at 5:05:04 PM.    Final         Scheduled Meds: . apixaban  10 mg Oral BID   Followed by  . [START ON 08/04/2019] apixaban  5 mg Oral BID  . vitamin C  500 mg Oral Daily  . Chlorhexidine Gluconate Cloth  6 each Topical Daily  . clopidogrel  75 mg  Oral Daily  . darifenacin  7.5 mg Oral Daily  . docusate sodium  100 mg Oral BID  . hydrocortisone cream   Topical BID  . insulin aspart  0-5 Units Subcutaneous QHS  . insulin aspart  0-9 Units Subcutaneous TID WC  . nystatin  5 mL Mouth/Throat QID  . pantoprazole  40 mg Oral Daily  . rosuvastatin  40 mg Oral QHS  . sertraline  50 mg Oral Daily  . sucralfate  1 g Oral BID  . zinc sulfate  220 mg Oral Daily   Continuous Infusions: . sodium chloride Stopped (07/29/19 1620)  LOS: 4 days    Time spent: over 30 min    Fayrene Helper, MD [facility] Pager AMION  If 7PM-7AM, please contact night-coverage [email or web address] Password [facility] 07/29/2019, 8:05 PM

## 2019-07-29 NOTE — Telephone Encounter (Signed)
Make sure she knows to call us if she needs anything---  go to ER if SOB

## 2019-07-29 NOTE — Telephone Encounter (Signed)
FYI

## 2019-07-30 ENCOUNTER — Other Ambulatory Visit: Payer: Self-pay | Admitting: *Deleted

## 2019-07-30 NOTE — Patient Outreach (Signed)
Telephone outreach to resume care management services. Pt has had an admission for COVID19 +, AKI, DVT.  NP last outreached pt on 06/25/19 without success. We had been working on her diabetes diet control, which I note her HgbA1C has come down from 10.0 to 8.7 during our work together.  NP did not reach pt this afternoon, but left a message for her to please return my call.  Pt has had 3 admits this year all unrelated and one ED visit for a fall.   Oncologist, Dr. Marin Olp has said there is no indication that she has any return of stomach cancer.  I will call her again tomorrow if I do not hear from her this evening.  Eulah Pont. Myrtie Neither, MSN, [facility] Gerontological Nurse Practitioner [facility] Care Management [phone]

## 2019-07-31 ENCOUNTER — Ambulatory Visit: Payer: Self-pay | Admitting: *Deleted

## 2019-08-02 ENCOUNTER — Ambulatory Visit (HOSPITAL_BASED_OUTPATIENT_CLINIC_OR_DEPARTMENT_OTHER): Payer: Medicare HMO

## 2019-08-05 ENCOUNTER — Telehealth: Payer: Self-pay | Admitting: *Deleted

## 2019-08-05 ENCOUNTER — Other Ambulatory Visit: Payer: Self-pay | Admitting: *Deleted

## 2019-08-05 NOTE — Telephone Encounter (Signed)
Copied from [place] [id number]. Topic: General - Inquiry >> Aug 05, 2019 11:42 AM Alease Frame wrote: Reason for CRM: Patient is wanting a call back from Dr Etter Sjogren or her nurse. Pt didn't state reason. Please advise

## 2019-08-05 NOTE — Patient Outreach (Signed)
Telephone visit post hospitalization for COVID. Primary office makes transition of care calls. Pt has been active with me since her hospitalization in October.  Today, [name] reports she is doing OK, ;however, she remains SOB with any activity. She has not needed O2. She is by herself but she has frequent calls to check on her by her family. She has plenty of food.  She has all her meds. She does not have her follow up appt with [name] yet and says she will call to schedule that today.  We discussed early access to care if she feels like her condition changes for the worse. She states she would like to stay at home if at all possible.  I will call her in one week and have encouraged her to call me with any new issues or questions.  THN CARE PLAN:  [facility] CM Care Plan Problem One     Most Recent Value  Care Plan Problem One  Frequency of falls.  Role Documenting the Problem One  Care Management [place] for Problem One  Active  THN Long Term Goal   Pt will not fall and sustain an injury warranting ED visit over the next 60 days.  THN Long Term Goal Start Date  05/20/19  [facility] Long Term Goal Met Date  08/05/19  Interventions for Problem One Long Term Goal  Reinforce safety measures.    THN CM Care Plan Problem Two     Most Recent Value  Care Plan Problem Two  Diabetes not at goal (HgbA1C 10.0)  Role Documenting the Problem Two  Care Management [place] for Problem Two  Not Active  Interventions for Problem Two Long Term Goal   Praised pt for her increased attention to her diabetes self management.  THN Long Term Goal  Pt hgb A1C will be less then 10.0 on next check within the next 60 days.  THN Long Term Goal Start Date  05/20/19  THN Long Term Goal Met Date  08/05/19  THN CM Short Term Goal #1   Pt will read and discuss All About Carbs with nurse during this month until the end of November.  THN CM Short Term Goal #1 Met Date   08/05/19  Interventions for  Short Term Goal #2   Will set new goal for pt HgbA1C to reach 7.5 by next evaluation in 6 months.    THN CM Care Plan Problem Three     Most Recent Value  Care Plan Problem Three  COVID 19 infection  Role Documenting the Problem Three  Care Management Coordinator  Care Plan for Problem Three  Active  THN Long Term Goal   Pt will not have COVID 19 complications which require readmission in the next 60 days.  THN Long Term Goal Start Date  08/05/19  Interventions for Problem Three Long Term Goal  Assess pt medications, scheduled appts, transportation, food. Advised to call at first increase in any sxs to avoid severe complications.     Jessica Pont. Myrtie Neither, MSN, [facility] Gerontological Nurse Practitioner [facility] Care Management [phone]

## 2019-08-08 ENCOUNTER — Inpatient Hospital Stay (HOSPITAL_COMMUNITY)
Admission: EM | Admit: 2019-08-08 | Discharge: 2019-08-14 | DRG: 683 | Disposition: A | Discharge status: Home Health | Payer: Medicare HMO | Attending: Internal Medicine | Admitting: Internal Medicine

## 2019-08-08 ENCOUNTER — Emergency Department (HOSPITAL_COMMUNITY): Payer: Medicare HMO

## 2019-08-08 ENCOUNTER — Encounter (HOSPITAL_COMMUNITY): Payer: Self-pay | Admitting: Family Medicine

## 2019-08-08 DIAGNOSIS — K449 Diaphragmatic hernia without obstruction or gangrene: Secondary | ICD-10-CM | POA: Diagnosis not present

## 2019-08-08 DIAGNOSIS — Z833 Family history of diabetes mellitus: Secondary | ICD-10-CM

## 2019-08-08 DIAGNOSIS — R531 Weakness: Secondary | ICD-10-CM | POA: Diagnosis not present

## 2019-08-08 DIAGNOSIS — N189 Chronic kidney disease, unspecified: Secondary | ICD-10-CM | POA: Diagnosis not present

## 2019-08-08 DIAGNOSIS — R21 Rash and other nonspecific skin eruption: Secondary | ICD-10-CM | POA: Diagnosis present

## 2019-08-08 DIAGNOSIS — E1122 Type 2 diabetes mellitus with diabetic chronic kidney disease: Secondary | ICD-10-CM | POA: Diagnosis present

## 2019-08-08 DIAGNOSIS — I25118 Atherosclerotic heart disease of native coronary artery with other forms of angina pectoris: Secondary | ICD-10-CM | POA: Diagnosis not present

## 2019-08-08 DIAGNOSIS — N179 Acute kidney failure, unspecified: Principal | ICD-10-CM | POA: Diagnosis present

## 2019-08-08 DIAGNOSIS — K760 Fatty (change of) liver, not elsewhere classified: Secondary | ICD-10-CM | POA: Diagnosis present

## 2019-08-08 DIAGNOSIS — W19XXXS Unspecified fall, sequela: Secondary | ICD-10-CM | POA: Diagnosis not present

## 2019-08-08 DIAGNOSIS — Z7901 Long term (current) use of anticoagulants: Secondary | ICD-10-CM

## 2019-08-08 DIAGNOSIS — Z8673 Personal history of transient ischemic attack (TIA), and cerebral infarction without residual deficits: Secondary | ICD-10-CM

## 2019-08-08 DIAGNOSIS — I252 Old myocardial infarction: Secondary | ICD-10-CM

## 2019-08-08 DIAGNOSIS — I10 Essential (primary) hypertension: Secondary | ICD-10-CM | POA: Diagnosis present

## 2019-08-08 DIAGNOSIS — G894 Chronic pain syndrome: Secondary | ICD-10-CM | POA: Diagnosis present

## 2019-08-08 DIAGNOSIS — Z96641 Presence of right artificial hip joint: Secondary | ICD-10-CM | POA: Diagnosis present

## 2019-08-08 DIAGNOSIS — I951 Orthostatic hypotension: Secondary | ICD-10-CM | POA: Diagnosis not present

## 2019-08-08 DIAGNOSIS — Z880 Allergy status to penicillin: Secondary | ICD-10-CM

## 2019-08-08 DIAGNOSIS — R197 Diarrhea, unspecified: Secondary | ICD-10-CM | POA: Diagnosis not present

## 2019-08-08 DIAGNOSIS — I129 Hypertensive chronic kidney disease with stage 1 through stage 4 chronic kidney disease, or unspecified chronic kidney disease: Secondary | ICD-10-CM | POA: Diagnosis present

## 2019-08-08 DIAGNOSIS — U071 COVID-19: Secondary | ICD-10-CM | POA: Diagnosis not present

## 2019-08-08 DIAGNOSIS — E876 Hypokalemia: Secondary | ICD-10-CM | POA: Diagnosis present

## 2019-08-08 DIAGNOSIS — Z8616 Personal history of COVID-19: Secondary | ICD-10-CM

## 2019-08-08 DIAGNOSIS — K5730 Diverticulosis of large intestine without perforation or abscess without bleeding: Secondary | ICD-10-CM | POA: Diagnosis not present

## 2019-08-08 DIAGNOSIS — Z955 Presence of coronary angioplasty implant and graft: Secondary | ICD-10-CM

## 2019-08-08 DIAGNOSIS — R296 Repeated falls: Secondary | ICD-10-CM | POA: Diagnosis present

## 2019-08-08 DIAGNOSIS — Z87891 Personal history of nicotine dependence: Secondary | ICD-10-CM

## 2019-08-08 DIAGNOSIS — N183 Chronic kidney disease, stage 3 unspecified: Secondary | ICD-10-CM | POA: Diagnosis present

## 2019-08-08 DIAGNOSIS — Z96652 Presence of left artificial knee joint: Secondary | ICD-10-CM | POA: Diagnosis present

## 2019-08-08 DIAGNOSIS — E872 Acidosis: Secondary | ICD-10-CM | POA: Diagnosis present

## 2019-08-08 DIAGNOSIS — J45909 Unspecified asthma, uncomplicated: Secondary | ICD-10-CM | POA: Diagnosis present

## 2019-08-08 DIAGNOSIS — I2510 Atherosclerotic heart disease of native coronary artery without angina pectoris: Secondary | ICD-10-CM | POA: Diagnosis present

## 2019-08-08 DIAGNOSIS — E1121 Type 2 diabetes mellitus with diabetic nephropathy: Secondary | ICD-10-CM

## 2019-08-08 DIAGNOSIS — R110 Nausea: Secondary | ICD-10-CM | POA: Diagnosis present

## 2019-08-08 DIAGNOSIS — Z820 Family history of epilepsy and other diseases of the nervous system: Secondary | ICD-10-CM

## 2019-08-08 DIAGNOSIS — R6510 Systemic inflammatory response syndrome (SIRS) of non-infectious origin without acute organ dysfunction: Secondary | ICD-10-CM | POA: Diagnosis present

## 2019-08-08 DIAGNOSIS — Z86718 Personal history of other venous thrombosis and embolism: Secondary | ICD-10-CM

## 2019-08-08 DIAGNOSIS — E785 Hyperlipidemia, unspecified: Secondary | ICD-10-CM | POA: Diagnosis present

## 2019-08-08 DIAGNOSIS — R0602 Shortness of breath: Secondary | ICD-10-CM | POA: Diagnosis not present

## 2019-08-08 DIAGNOSIS — R1010 Upper abdominal pain, unspecified: Secondary | ICD-10-CM | POA: Diagnosis not present

## 2019-08-08 DIAGNOSIS — E1140 Type 2 diabetes mellitus with diabetic neuropathy, unspecified: Secondary | ICD-10-CM | POA: Diagnosis present

## 2019-08-08 DIAGNOSIS — Z79899 Other long term (current) drug therapy: Secondary | ICD-10-CM

## 2019-08-08 DIAGNOSIS — Z20822 Contact with and (suspected) exposure to covid-19: Secondary | ICD-10-CM | POA: Diagnosis not present

## 2019-08-08 DIAGNOSIS — Z85028 Personal history of other malignant neoplasm of stomach: Secondary | ICD-10-CM

## 2019-08-08 DIAGNOSIS — W19XXXA Unspecified fall, initial encounter: Secondary | ICD-10-CM | POA: Diagnosis present

## 2019-08-08 DIAGNOSIS — Z794 Long term (current) use of insulin: Secondary | ICD-10-CM

## 2019-08-08 DIAGNOSIS — R52 Pain, unspecified: Secondary | ICD-10-CM | POA: Diagnosis not present

## 2019-08-08 DIAGNOSIS — K219 Gastro-esophageal reflux disease without esophagitis: Secondary | ICD-10-CM | POA: Diagnosis present

## 2019-08-08 DIAGNOSIS — Z801 Family history of malignant neoplasm of trachea, bronchus and lung: Secondary | ICD-10-CM

## 2019-08-08 DIAGNOSIS — Z903 Acquired absence of stomach [part of]: Secondary | ICD-10-CM

## 2019-08-08 DIAGNOSIS — R109 Unspecified abdominal pain: Secondary | ICD-10-CM

## 2019-08-08 DIAGNOSIS — E860 Dehydration: Secondary | ICD-10-CM | POA: Diagnosis not present

## 2019-08-08 DIAGNOSIS — Z743 Need for continuous supervision: Secondary | ICD-10-CM | POA: Diagnosis not present

## 2019-08-08 DIAGNOSIS — Z7902 Long term (current) use of antithrombotics/antiplatelets: Secondary | ICD-10-CM

## 2019-08-08 DIAGNOSIS — D573 Sickle-cell trait: Secondary | ICD-10-CM | POA: Diagnosis present

## 2019-08-08 LAB — COMPREHENSIVE METABOLIC PANEL
ALT: 68 U/L — ABNORMAL HIGH (ref 0–44)
AST: 176 U/L — ABNORMAL HIGH (ref 15–41)
Albumin: 2 g/dL — ABNORMAL LOW (ref 3.5–5.0)
Alkaline Phosphatase: 122 U/L (ref 38–126)
Anion gap: 12 (ref 5–15)
BUN: 24 mg/dL — ABNORMAL HIGH (ref 8–23)
CO2: 20 mmol/L — ABNORMAL LOW (ref 22–32)
Calcium: 7.6 mg/dL — ABNORMAL LOW (ref 8.9–10.3)
Chloride: 109 mmol/L (ref 98–111)
Creatinine, Ser: 2.34 mg/dL — ABNORMAL HIGH (ref 0.44–1.00)
GFR calc Af Amer: 24 mL/min — ABNORMAL LOW (ref >60)
GFR calc non Af Amer: 21 mL/min — ABNORMAL LOW (ref >60)
Glucose, Bld: 139 mg/dL — ABNORMAL HIGH (ref 70–99)
Potassium: 4.5 mmol/L (ref 3.5–5.1)
Sodium: 141 mmol/L (ref 135–145)
Total Bilirubin: 1.2 mg/dL (ref 0.3–1.2)
Total Protein: 5.3 g/dL — ABNORMAL LOW (ref 6.5–8.1)

## 2019-08-08 LAB — URINALYSIS, ROUTINE W REFLEX MICROSCOPIC
Bilirubin Urine: NEGATIVE
Glucose, UA: NEGATIVE mg/dL
Ketones, ur: 5 mg/dL — AB
Nitrite: NEGATIVE
Protein, ur: 30 mg/dL — AB
Specific Gravity, Urine: 1.015 (ref 1.005–1.030)
pH: 5 (ref 5.0–8.0)

## 2019-08-08 LAB — LACTIC ACID, PLASMA
Lactic Acid, Venous: 2.8 mmol/L (ref 0.5–1.9)
Lactic Acid, Venous: 1.5 mmol/L (ref 0.5–1.9)

## 2019-08-08 LAB — POC SARS CORONAVIRUS 2 AG -  ED: SARS Coronavirus 2 Ag: NEGATIVE

## 2019-08-08 LAB — MAGNESIUM: Magnesium: 1.7 mg/dL (ref 1.7–2.4)

## 2019-08-08 LAB — CBC
HCT: 33.8 % — ABNORMAL LOW (ref 36.0–46.0)
HCT: 31.1 % — ABNORMAL LOW (ref 36.0–46.0)
Hemoglobin: 11.6 g/dL — ABNORMAL LOW (ref 12.0–15.0)
Hemoglobin: 10.6 g/dL — ABNORMAL LOW (ref 12.0–15.0)
MCH: 28.9 pg (ref 26.0–34.0)
MCH: 28.6 pg (ref 26.0–34.0)
MCHC: 34.3 g/dL (ref 30.0–36.0)
MCHC: 34.1 g/dL (ref 30.0–36.0)
MCV: 84.1 fL (ref 80.0–100.0)
MCV: 84.1 fL (ref 80.0–100.0)
Platelets: 416 10*3/uL — ABNORMAL HIGH (ref 150–400)
Platelets: 361 10*3/uL (ref 150–400)
RBC: 4.02 MIL/uL (ref 3.87–5.11)
RBC: 3.7 MIL/uL — ABNORMAL LOW (ref 3.87–5.11)
RDW: 17.2 % — ABNORMAL HIGH (ref 11.5–15.5)
RDW: 17.1 % — ABNORMAL HIGH (ref 11.5–15.5)
WBC: 14.4 10*3/uL — ABNORMAL HIGH (ref 4.0–10.5)
WBC: 15.3 10*3/uL — ABNORMAL HIGH (ref 4.0–10.5)
nRBC: 0 % (ref 0.0–0.2)
nRBC: 0 % (ref 0.0–0.2)

## 2019-08-08 LAB — CREATININE, SERUM
Creatinine, Ser: 2.18 mg/dL — ABNORMAL HIGH (ref 0.44–1.00)
GFR calc Af Amer: 26 mL/min — ABNORMAL LOW (ref >60)
GFR calc non Af Amer: 23 mL/min — ABNORMAL LOW (ref >60)

## 2019-08-08 LAB — GLUCOSE, CAPILLARY
Glucose-Capillary: 91 mg/dL (ref 70–99)
Glucose-Capillary: 217 mg/dL — ABNORMAL HIGH (ref 70–99)

## 2019-08-08 LAB — CBG MONITORING, ED: Glucose-Capillary: 125 mg/dL — ABNORMAL HIGH (ref 70–99)

## 2019-08-08 LAB — LIPASE, BLOOD: Lipase: 11 U/L (ref 11–51)

## 2019-08-08 MED ORDER — ROSUVASTATIN CALCIUM 20 MG PO TABS
40.0000 mg | ORAL_TABLET | Freq: Every day | ORAL | Status: DC
  Administered 2019-08-08 – 2019-08-09 (×2): 40 mg via ORAL
  Filled 2019-08-08: qty 1
  Filled 2019-08-08 (×2): qty 2

## 2019-08-08 MED ORDER — ACETAMINOPHEN 325 MG PO TABS
650.0000 mg | ORAL_TABLET | Freq: Four times a day (QID) | ORAL | Status: DC | PRN
  Administered 2019-08-08 – 2019-08-10 (×4): 650 mg via ORAL
  Filled 2019-08-08 (×4): qty 2

## 2019-08-08 MED ORDER — FAMOTIDINE 20 MG PO TABS
40.0000 mg | ORAL_TABLET | Freq: Every day | ORAL | Status: DC
  Administered 2019-08-08: 40 mg via ORAL
  Filled 2019-08-08: qty 2

## 2019-08-08 MED ORDER — ENOXAPARIN SODIUM 30 MG/0.3ML ~~LOC~~ SOLN
30.0000 mg | SUBCUTANEOUS | Status: DC
  Administered 2019-08-08: 30 mg via SUBCUTANEOUS
  Filled 2019-08-08: qty 0.3

## 2019-08-08 MED ORDER — FAMOTIDINE 20 MG PO TABS
20.0000 mg | ORAL_TABLET | Freq: Every day | ORAL | Status: DC
  Administered 2019-08-09 – 2019-08-14 (×6): 20 mg via ORAL
  Filled 2019-08-08 (×6): qty 1

## 2019-08-08 MED ORDER — INSULIN ASPART 100 UNIT/ML ~~LOC~~ SOLN
0.0000 [IU] | Freq: Three times a day (TID) | SUBCUTANEOUS | Status: DC
  Administered 2019-08-09 (×2): 2 [IU] via SUBCUTANEOUS
  Administered 2019-08-10 (×2): 3 [IU] via SUBCUTANEOUS
  Administered 2019-08-11: 1 [IU] via SUBCUTANEOUS
  Administered 2019-08-11 (×2): 2 [IU] via SUBCUTANEOUS
  Administered 2019-08-12 (×2): 1 [IU] via SUBCUTANEOUS
  Administered 2019-08-13 (×2): 2 [IU] via SUBCUTANEOUS
  Filled 2019-08-08: qty 0.09

## 2019-08-08 MED ORDER — ACETAMINOPHEN 650 MG RE SUPP: 650.0000 mg | Freq: Four times a day (QID) | RECTAL | Status: DC | PRN

## 2019-08-08 MED ORDER — INSULIN ASPART 100 UNIT/ML ~~LOC~~ SOLN
0.0000 [IU] | Freq: Every day | SUBCUTANEOUS | Status: DC
  Administered 2019-08-08 – 2019-08-10 (×2): 2 [IU] via SUBCUTANEOUS
  Filled 2019-08-08: qty 0.05

## 2019-08-08 MED ORDER — SODIUM CHLORIDE 0.9 % IV BOLUS
1000.0000 mL | Freq: Once | INTRAVENOUS | Status: AC
  Administered 2019-08-08: 1000 mL via INTRAVENOUS

## 2019-08-08 MED ORDER — ONDANSETRON HCL 4 MG/2ML IJ SOLN
4.0000 mg | Freq: Four times a day (QID) | INTRAMUSCULAR | Status: AC
  Administered 2019-08-08 – 2019-08-09 (×3): 4 mg via INTRAVENOUS
  Filled 2019-08-08 (×3): qty 2

## 2019-08-08 MED ORDER — BENZONATATE 100 MG PO CAPS: 100.0000 mg | ORAL_CAPSULE | Freq: Two times a day (BID) | ORAL | Status: DC | PRN

## 2019-08-08 MED ORDER — TRAZODONE HCL 50 MG PO TABS: 25.0000 mg | ORAL_TABLET | Freq: Every evening | ORAL | Status: DC | PRN

## 2019-08-08 MED ORDER — GABAPENTIN 300 MG PO CAPS
600.0000 mg | ORAL_CAPSULE | Freq: Two times a day (BID) | ORAL | Status: DC
  Administered 2019-08-08 – 2019-08-10 (×4): 600 mg via ORAL
  Filled 2019-08-08 (×4): qty 2

## 2019-08-08 MED ORDER — SODIUM CHLORIDE 0.9 % IV SOLN: INTRAVENOUS | Status: DC

## 2019-08-08 MED ORDER — CLOPIDOGREL BISULFATE 75 MG PO TABS
75.0000 mg | ORAL_TABLET | Freq: Every day | ORAL | Status: DC
  Administered 2019-08-08 – 2019-08-14 (×7): 75 mg via ORAL
  Filled 2019-08-08 (×7): qty 1

## 2019-08-08 MED ORDER — LIDOCAINE-PRILOCAINE 2.5-2.5 % EX CREA
TOPICAL_CREAM | Freq: Once | CUTANEOUS | Status: DC
  Filled 2019-08-08: qty 5

## 2019-08-08 NOTE — ED Notes (Signed)
ED TO INPATIENT HANDOFF REPORT  ED Nurse Name and Phone #:  Ottavio Norem rn [phone]  S Name/Age/Gender [name] 69 y.o. female Room/Bed: WA22/WA22  Code Status   Code Status: Full Code  Home/SNF/Other Home Patient oriented to: self, place, time and situation Is this baseline? Yes   Triage Complete: Triage complete  Chief Complaint AKI (acute kidney injury) ([place]) [N17.9]  Triage Note Pt was Covid (+) 07/25/2019; pt had multiple falls since and CC today is tailbone pain. [facility] personnel reports that pt is tachy, hypotensive, weak, dizziness inhibiting pt to stand, diarrhea x1 day and last fever was noted on 08/02/2019.   BP 98/60 > 100cc fluid bolus>112/78 HR 100 CBG 200 Temp 97.5    Allergies Allergies  Allergen Reactions  . Adhesive [Tape] Other (See Comments)    Burn Skin  . Codeine Other (See Comments)    paranoid  . Zocor [Simvastatin - High Dose] Other (See Comments)    Muscle spasms  . Penicillins Rash    Has patient had a PCN reaction causing immediate rash, facial/tongue/throat swelling, SOB or lightheadedness with hypotension: Yes Has patient had a PCN reaction causing severe rash involving mucus membranes or skin necrosis: No Has patient had a PCN reaction that required hospitalization does not remember.  Has patient had a PCN reaction occurring within the last 10 years: childhood If all of the above answers are "NO", then may proceed with Cephalosporin use.     Level of Care/Admitting Diagnosis ED Disposition    ED Disposition Condition Comment   Admit  Hospital Area: [facility] [[id number]]  Level of Care: Med-Surg [16]  Covid Evaluation: Confirmed COVID Positive  Diagnosis: AKI (acute kidney injury) [facility]) [[id number]]  Admitting Physician: Orma Flaming [[id number]]  Attending Physician: Orma Flaming [[id number]]       B Medical/Surgery History Past Medical History:  Diagnosis Date  . Asthma   . Degenerative joint disease   .  Diabetes mellitus   . Dyspnea   . GERD (gastroesophageal reflux disease)   . Hyperlipidemia    no longer on medication for this  . Hypertension    patient denies  . Myocardial infarction ([place]) 08/2018  . Neuropathy    bilateral hands and feet  . Sickle cell trait ([place])   . Sleep apnea   . stomach ca dx'd 2010   chemo/xrt comp 05/2009  . TIA (transient ischemic attack) 07/2015   Past Surgical History:  Procedure Laterality Date  . APPENDECTOMY    . CESAREAN SECTION    . COLON SURGERY     colonscopy  . COLONOSCOPY WITH PROPOFOL N/A 06/01/2016   Procedure: COLONOSCOPY WITH PROPOFOL;  Surgeon: Wilford Corner, MD;  Location[facility] ENDOSCOPY;  Service: Endoscopy;  Laterality: N/A;  . CORONARY STENT INTERVENTION N/A 08/29/2018   Procedure: CORONARY STENT INTERVENTION;  Surgeon: Wellington Hampshire, MD;  Location: [place] CV LAB;  Service: Cardiovascular;  Laterality: N/A;  . CORONARY/GRAFT ACUTE MI REVASCULARIZATION N/A 08/27/2018   Procedure: Coronary/Graft Acute MI Revascularization;  Surgeon: Leonie Man, MD;  Location: [place] CV LAB;  Service: Cardiovascular;  Laterality: N/A;  . ESOPHAGOGASTRODUODENOSCOPY N/A 10/15/2012   Procedure: ESOPHAGOGASTRODUODENOSCOPY (EGD);  Surgeon: Lear Ng, MD;  Location: [facility];  Service: Endoscopy;  Laterality: N/A;  . ESOPHAGOGASTRODUODENOSCOPY N/A 01/15/2016   Procedure: ESOPHAGOGASTRODUODENOSCOPY (EGD);  Surgeon: Ronald Lobo, MD;  Location: [facility] ENDOSCOPY;  Service: Endoscopy;  Laterality: N/A;  . HERNIA REPAIR    . LEFT HEART CATH AND  CORONARY ANGIOGRAPHY N/A 08/27/2018   Procedure: LEFT HEART CATH AND CORONARY ANGIOGRAPHY;  Surgeon: Leonie Man, MD;  Location: [place] CV LAB;  Service: Cardiovascular;  Laterality: N/A;  . SHOULDER SURGERY     Left  . TOTAL HIP ARTHROPLASTY     Right  . TOTAL KNEE ARTHROPLASTY     Left     A IV Location/Drains/Wounds Patient Lines/Drains/Airways Status   Active  Line/Drains/Airways    Name:   Placement date:   Placement time:   Site:   Days:   Implanted Port Right Chest   --    --    Chest      Peripheral IV 08/08/19 Left Antecubital   08/08/19    --    Antecubital   less than 1   External Urinary Catheter   08/08/19    1311    --   less than 1   Wound / Incision (Open or Dehisced) 03/22/19 Other (Comment) Knee Left;Anterior scrab   03/22/19    1958    Knee   139          Intake/Output Last 24 hours No intake or output data in the 24 hours ending 08/08/19 1600  Labs/Imaging Results for orders placed or performed during the hospital encounter of 08/08/19 (from the past 48 hour(s))  CBC     Status: Abnormal   Collection Time: 08/08/19  1:23 PM  Result Value Ref Range   WBC 14.4 (H) 4.0 - 10.5 K/uL   RBC 4.02 3.87 - 5.11 MIL/uL   Hemoglobin 11.6 (L) 12.0 - 15.0 g/dL   HCT 33.8 (L) 36.0 - 46.0 %   MCV 84.1 80.0 - 100.0 fL   MCH 28.9 26.0 - 34.0 pg   MCHC 34.3 30.0 - 36.0 g/dL   RDW 17.2 (H) 11.5 - 15.5 %   Platelets 416 (H) 150 - 400 K/uL   nRBC 0.0 0.0 - 0.2 %    Comment: Performed at [facility], [place] [address] [place], [place] [address]  Lactic acid, plasma     Status: Abnormal   Collection Time: 08/08/19  1:41 PM  Result Value Ref Range   Lactic Acid, Venous 2.8 (HH) 0.5 - 1.9 mmol/L    Comment: CRITICAL RESULT CALLED TO, READ BACK BY AND VERIFIED WITH: LEDWELL,A. RN @1435  08/08/19 BILLINGSLEY,L Performed at [facility], [place] [address] [place], [place] [address]   CBG monitoring, ED     Status: Abnormal   Collection Time: 08/08/19  1:56 PM  Result Value Ref Range   Glucose-Capillary 125 (H) 70 - 99 mg/dL  Comprehensive metabolic panel     Status: Abnormal   Collection Time: 08/08/19  2:00 PM  Result Value Ref Range   Sodium 141 135 - 145 mmol/L   Potassium 4.5 3.5 - 5.1 mmol/L   Chloride 109 98 - 111 mmol/L   CO2 20 (L) 22 - 32 mmol/L   Glucose, Bld 139 (H) 70 - 99 mg/dL   BUN 24 (H)  8 - 23 mg/dL   Creatinine, Ser 2.34 (H) 0.44 - 1.00 mg/dL   Calcium 7.6 (L) 8.9 - 10.3 mg/dL   Total Protein 5.3 (L) 6.5 - 8.1 g/dL   Albumin 2.0 (L) 3.5 - 5.0 g/dL   AST 176 (H) 15 - 41 U/L   ALT 68 (H) 0 - 44 U/L   Alkaline Phosphatase 122 38 - 126 U/L   Total Bilirubin 1.2 0.3 - 1.2  mg/dL   GFR calc non Af Amer 21 (L) >60 mL/min   GFR calc Af Amer 24 (L) >60 mL/min   Anion gap 12 5 - 15    Comment: Performed at [facility], [place] [address] [place], [place] [address]  Lipase, blood     Status: None   Collection Time: 08/08/19  2:00 PM  Result Value Ref Range   Lipase 11 11 - 51 U/L    Comment: Performed at [facility], [place] [address] [place], [place] [address]  POC SARS Coronavirus 2 Ag-ED - Nasal Swab (BD Veritor Kit)     Status: None   Collection Time: 08/08/19  3:17 PM  Result Value Ref Range   SARS Coronavirus 2 Ag NEGATIVE NEGATIVE    Comment: (NOTE) SARS-CoV-2 antigen NOT DETECTED.  Negative results are presumptive.  Negative results do not preclude SARS-CoV-2 infection and should not be used as the sole basis for treatment or other patient management decisions, including infection  control decisions, particularly in the presence of clinical signs and  symptoms consistent with COVID-19, or in those who have been in contact with the virus.  Negative results must be combined with clinical observations, patient history, and epidemiological information. The expected result is Negative. Fact Sheet for Patients: PodPark.tn Fact Sheet for Healthcare Providers: GiftContent.is This test is not yet approved or cleared by the [place] FDA and  has been authorized for detection and/or diagnosis of SARS-CoV-2 by FDA under an Emergency Use Authorization (EUA).  This EUA will remain in effect (meaning this test can be used) for the duration of  the COVID-19 de claration under  Section 564(b)(1) of the Act, 21 U.S.C. section 360bbb-3(b)(1), unless the authorization is terminated or revoked sooner.   Urinalysis, Routine w reflex microscopic     Status: Abnormal   Collection Time: 08/08/19  3:23 PM  Result Value Ref Range   Color, Urine AMBER (A) YELLOW    Comment: BIOCHEMICALS MAY BE AFFECTED BY COLOR   APPearance HAZY (A) CLEAR   Specific Gravity, Urine 1.015 1.005 - 1.030   pH 5.0 5.0 - 8.0   Glucose, UA NEGATIVE NEGATIVE mg/dL   Hgb urine dipstick LARGE (A) NEGATIVE   Bilirubin Urine NEGATIVE NEGATIVE   Ketones, ur 5 (A) NEGATIVE mg/dL   Protein, ur 30 (A) NEGATIVE mg/dL   Nitrite NEGATIVE NEGATIVE   Leukocytes,Ua MODERATE (A) NEGATIVE   RBC / HPF 0-5 0 - 5 RBC/hpf   WBC, UA 21-50 0 - 5 WBC/hpf   Bacteria, UA RARE (A) NONE SEEN   Squamous Epithelial / LPF 0-5 0 - 5   Mucus PRESENT    Hyaline Casts, UA PRESENT     Comment: Performed at [facility], [place] [address] [place], [place] [address]   CT ABDOMEN PELVIS WO CONTRAST  Result Date: 08/08/2019 CLINICAL DATA:  Nausea and vomiting. Hypotension. Weakness and dizziness. Diarrhea. COVID-19. EXAM: CT ABDOMEN AND PELVIS WITHOUT CONTRAST TECHNIQUE: Multidetector CT imaging of the abdomen and pelvis was performed following the standard protocol without IV contrast. COMPARISON:  CT scan dated 03/22/2019 FINDINGS: Lower chest: There are small patchy peripheral infiltrates at both lung bases consistent with COVID pneumonia. Heart size is normal. Coronary artery calcifications. Coronary stents. No pericardial effusion. Small hiatal hernia, chronic. Hepatobiliary: Severe progressive hepatic steatosis. Gallbladder is distended. No dilated bile ducts. Pancreas: Diffuse pancreatic atrophy, unchanged. Otherwise negative. Spleen: Normal in size without focal abnormality. Adrenals/Urinary Tract: Normal adrenal glands.  Slight left renal atrophy, chronic. Compensatory hypertrophy of the right kidney. No  hydronephrosis. The visualized portion of the bladder is normal. Bladder detail is obscured by metallic artifact from the right hip prosthesis. Stomach/Bowel: Small hiatal hernia. Surgical staples in the stomach, duodenum, and at the tip of the cecum. Prior appendectomy. There are few diverticula in the sigmoid portion of the colon. No acute bowel abnormality. Vascular/Lymphatic: Aortic atherosclerosis. No enlarged abdominal or pelvic lymph nodes. Reproductive: The area of the uterus and ovaries are obscured by metallic artifact. Other: No abdominal wall hernia or abnormality. No abdominopelvic ascites. Musculoskeletal: No acute abnormalities. IMPRESSION: 1. Small patchy peripheral infiltrates at both lung bases consistent with COVID pneumonia. 2. Severe progressive hepatic steatosis. 3. No acute abnormality of the abdomen and pelvis. 4. Aortic Atherosclerosis (ICD10-I70.0). Electronically Signed   By: Lorriane Shire M.D.   On: 08/08/2019 15:36   DG Chest Port 1 View  Result Date: 08/08/2019 CLINICAL DATA:  Shortness of breath EXAM: PORTABLE CHEST 1 VIEW COMPARISON:  July 29, 2019 FINDINGS: Port-A-Cath tip is in the superior vena cava. No pneumothorax. There is a small right pleural effusion. There is no edema or consolidation. Heart is upper normal in size with pulmonary vascularity normal. No evident adenopathy. There is degenerative change in the left shoulder. IMPRESSION: Central catheter position unchanged without pneumothorax. Small right pleural effusion. No edema or consolidation. Stable cardiac silhouette. No demonstrable adenopathy. Electronically Signed   By: Lowella Grip III M.D.   On: 08/08/2019 14:38    Pending Labs Unresulted Labs (From admission, onward)    Start     Ordered   08/15/19 0500  Creatinine, serum  (enoxaparin (LOVENOX)    CrCl >/= 30 ml/min)  Weekly,   R    Comments: while on enoxaparin therapy    08/08/19 1525   08/09/19 7829  Basic metabolic panel  Tomorrow  morning,   R     08/08/19 1525   08/09/19 0500  CBC  Tomorrow morning,   R     08/08/19 1525   08/08/19 1541  GI pathogen panel by PCR, stool  (Gastrointestinal Panel by PCR, Stool                                                                                                                                                     *Does Not include CLOSTRIDIUM DIFFICILE testing.**If CDIFF testing is needed, select the C Difficile Quick Screen w PCR reflex order below)  Once,   STAT     08/08/19 1541   08/08/19 1524  Magnesium  Add-on,   AD     08/08/19 1525   08/08/19 1522  CBC  (enoxaparin (LOVENOX)    CrCl >/= 30 ml/min)  Once,   STAT    Comments: Baseline for enoxaparin therapy IF NOT ALREADY DRAWN.  Notify MD if PLT < 100 K.    08/08/19 1525   08/08/19 1522  Creatinine, serum  (enoxaparin (LOVENOX)    CrCl >/= 30 ml/min)  Once,   STAT    Comments: Baseline for enoxaparin therapy IF NOT ALREADY DRAWN.    08/08/19 1525   08/08/19 1341  Lactic acid, plasma  Now then every 2 hours,   STAT     08/08/19 1341          Vitals/Pain Today's Vitals   08/08/19 1255 08/08/19 1300 08/08/19 1420 08/08/19 1440  BP: 100/60 98/69 92/65  106/63  Pulse: 86 91 87 79  Resp: 16 16 18 18   Temp: 97.8 F (36.6 C)     TempSrc: Oral     SpO2: 93%  98% 100%    Isolation Precautions Enteric precautions (UV disinfection)  Medications Medications  enoxaparin (LOVENOX) injection 30 mg (has no administration in time range)  0.9 %  sodium chloride infusion (has no administration in time range)  acetaminophen (TYLENOL) tablet 650 mg (has no administration in time range)    Or  acetaminophen (TYLENOL) suppository 650 mg (has no administration in time range)  traZODone (DESYREL) tablet 25 mg (has no administration in time range)  insulin aspart (novoLOG) injection 0-9 Units (has no administration in time range)  insulin aspart (novoLOG) injection 0-5 Units (has no administration in time range)  ondansetron  (ZOFRAN) injection 4 mg (has no administration in time range)  clopidogrel (PLAVIX) tablet 75 mg (has no administration in time range)  famotidine (PEPCID) tablet 40 mg (has no administration in time range)  benzonatate (TESSALON) capsule 100 mg (has no administration in time range)  rosuvastatin (CRESTOR) tablet 40 mg (has no administration in time range)  gabapentin (NEURONTIN) tablet 600 mg (has no administration in time range)  sodium chloride 0.9 % bolus 1,000 mL (1,000 mLs Intravenous New Bag/Given 08/08/19 1347)  sodium chloride 0.9 % bolus 1,000 mL (1,000 mLs Intravenous New Bag/Given 08/08/19 1446)    Mobility walks with device     Focused Assessments Cardiac Assessment Handoff:    Lab Results  Component Value Date   CKTOTAL 92 03/01/2011   CKMB 1.5 03/01/2011   TROPONINI 13.27 (HH) 08/28/2018   Lab Results  Component Value Date   DDIMER 1.47 (H) 07/29/2019   Does the Patient currently have chest pain? No  , Pulmonary Assessment Handoff:  Lung sounds:   O2 Device: Room Air        R Recommendations: See Admitting Provider Note  Report given to:   Additional Notes:

## 2019-08-08 NOTE — ED Triage Notes (Signed)
Pt was Covid (+) 07/25/2019; pt had multiple falls since and CC today is tailbone pain. [facility] personnel reports that pt is tachy, hypotensive, weak, dizziness inhibiting pt to stand, diarrhea x1 day and last fever was noted on 08/02/2019.   BP 98/60 > 100cc fluid bolus>112/78 HR 100 CBG 200 Temp 97.5

## 2019-08-08 NOTE — ED Provider Notes (Signed)
[facility] DEPT Provider Note   CSN: [id number] Arrival date & time: 08/08/19  1239     History Chief Complaint  Patient presents with  . Dizziness    [name] is a 69 y.o. female.  69 year old female with prior medical history as detailed below presents for evaluation of nausea and weakness.  Patient reports recent admission for Covid infection.  She reports that over the last week she has developed increasing weakness associated with nausea and mild diarrhea.  She denies vomiting.  She denies fever.  She denies current chest pain, cough, or focal weakness.  She does report mild intermittent abdominal cramping concurrent with her nausea.  The history is provided by the patient and medical records.  Illness Location:  Nausea, diarrhea, weakness Severity:  Mild Onset quality:  Gradual Duration:  5 days Timing:  Constant Progression:  Worsening Chronicity:  New Associated symptoms: no chest pain, no fever and no shortness of breath        Past Medical History:  Diagnosis Date  . Asthma   . Degenerative joint disease   . Diabetes mellitus   . Dyspnea   . GERD (gastroesophageal reflux disease)   . Hyperlipidemia    no longer on medication for this  . Hypertension    patient denies  . Myocardial infarction ([place]) 08/2018  . Neuropathy    bilateral hands and feet  . Sickle cell trait ([place])   . Sleep apnea   . stomach ca dx'd 2010   chemo/xrt comp 05/2009  . TIA (transient ischemic attack) 07/2015    Patient Active Problem List   Diagnosis Date Noted  . COVID-19 virus infection 07/25/2019  . Elevated LFTs 07/25/2019  . COVID-19 07/25/2019  . Allergic contact dermatitis due to adhesives 05/23/2019  . Insomnia 05/23/2019  . SIRS (systemic inflammatory response syndrome) ([place]) 05/14/2019  . NSTEMI (non-ST elevated myocardial infarction) ([place]) 05/14/2019  . Hyperglycemia   . Neutropenia ([place])   . CAD (coronary artery disease)   .  Obesity with serious comorbidity 04/16/2019  . Gastroenteritis 03/27/2019  . Falls frequently 03/27/2019  . Acute gastroenteritis 03/22/2019  . Hematoma of scalp 03/14/2019  . Vitamin D deficiency 11/09/2018  . Muscle spasm 10/19/2018  . Chest pain 09/07/2018  . Palpitations 09/07/2018  . Other fatigue 09/07/2018  . Acute ST elevation myocardial infarction (STEMI) of inferior wall ([place]) 08/27/2018    Class: Hospitalized for  . Coronary artery disease involving native coronary artery of native heart with unstable angina pectoris ([place]) 08/27/2018    Class: Diagnosis of  . Acute vaginitis 08/20/2018  . Hyperlipidemia associated with type 2 diabetes mellitus ([place]) 04/09/2018  . Bronchitis 11/02/2017  . Sepsis ([place]) 11/02/2017  . Leucocytosis 11/02/2017  . Moderate persistent asthma with acute exacerbation 05/26/2017  . Influenza A 05/26/2017  . Acute pain of right shoulder 03/07/2017  . Acute bilateral low back pain without sciatica 03/07/2017  . Chronic pain 03/07/2017  . Fatty liver 03/24/2016  . Weakness 03/23/2016  . Unexplained weight loss 03/23/2016  . Melena 03/23/2016  . Hypotension 02/03/2016  . Syncope 02/03/2016  . Hypomagnesemia 02/03/2016  . AKI (acute kidney injury) ([place]) 02/03/2016  . Gastrointestinal dysmotility 01/20/2016  . CKD (chronic kidney disease) stage 3, GFR 30-59 ml/min (H[facility]) 01/20/2016  . Abscess of right thigh 01/20/2016  . Chronic pain syndrome 01/15/2016  . Diarrhea 01/15/2016  . Generalized abdominal pain 01/14/2016  . Type 2 diabetes with nephropathy ([place]) 12/30/2015  . TIA (  transient ischemic attack) 07/2015  . Morbid obesity due to excess calories ([place]) 05/05/2015  . Diabetic polyneuropathy associated with type 2 diabetes mellitus ([place]) 01/02/2015  . Acute bacterial sinusitis 01/02/2015  . Onychomycosis 11/04/2014  . Pain in lower limb 11/04/2014  . Multinodular goiter (nontoxic) 10/16/2014  . History of CVA (cerebrovascular accident)  08/11/2014  . Iron deficiency anemia 05/20/2014  . Major depressive disorder, single episode, moderate ([place]) 05/20/2014  . Dizziness of unknown cause 04/28/2014  . MDD (major depressive disorder), recurrent episode, moderate ([place]) 01/28/2014  . GERD (gastroesophageal reflux disease) 01/02/2014  . Nausea 10/15/2012  . Stomach cancer ([place]) 12/10/2008  . CHRONIC RESPIRATORY FAILURE 01/03/2008  . Hyperlipidemia 06/27/2007  . OBESITY, MORBID 06/27/2007  . SLEEP APNEA, OBSTRUCTIVE 06/27/2007  . Generalized idiopathic epilepsy and epileptic syndromes, not intractable, with status epilepticus ([place]) 06/27/2007  . Asthma 06/27/2007  . OSTEOARTHRITIS  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 06/27/2007    Past Surgical History:  Procedure Laterality Date  . APPENDECTOMY    . CESAREAN SECTION    . COLON SURGERY     colonscopy  . COLONOSCOPY WITH PROPOFOL N/A 06/01/2016   Procedure: COLONOSCOPY WITH PROPOFOL;  Surgeon: Wilford Corner, MD;  Location: [facility] ENDOSCOPY;  Service: Endoscopy;  Laterality: N/A;  . CORONARY STENT INTERVENTION N/A 08/29/2018   Procedure: CORONARY STENT INTERVENTION;  Surgeon: Wellington Hampshire, MD;  Location: [facility] CV LAB;  Service: Cardiovascular;  Laterality: N/A;  . CORONARY/GRAFT ACUTE MI REVASCULARIZATION N/A 08/27/2018   Procedure: Coronary/Graft Acute MI Revascularization;  Surgeon: Leonie Man, MD;  Location: [facility] CV LAB;  Service: Cardiovascular;  Laterality: N/A;  . ESOPHAGOGASTRODUODENOSCOPY N/A 10/15/2012   Procedure: ESOPHAGOGASTRODUODENOSCOPY (EGD);  Surgeon: Lear Ng, MD;  Location: [facility] ENDOSCOPY;  Service: Endoscopy;  Laterality: N/A;  . ESOPHAGOGASTRODUODENOSCOPY N/A 01/15/2016   Procedure: ESOPHAGOGASTRODUODENOSCOPY (EGD);  Surgeon: Ronald Lobo, MD;  Location: [facility] ENDOSCOPY;  Service: Endoscopy;  Laterality: N/A;  . HERNIA REPAIR    . LEFT HEART CATH AND CORONARY ANGIOGRAPHY N/A 08/27/2018   Procedure: LEFT HEART CATH AND CORONARY ANGIOGRAPHY;   Surgeon: Leonie Man, MD;  Location: [facility] LAB;  Service: Cardiovascular;  Laterality: N/A;  . SHOULDER SURGERY     Left  . TOTAL HIP ARTHROPLASTY     Right  . TOTAL KNEE ARTHROPLASTY     Left     OB History    Gravida  4   Para  4   Term      Preterm      AB      Living  4     SAB      TAB      Ectopic      Multiple      Live Births              Family History  Problem Relation Age of Onset  . Diabetes Brother   . Alzheimer's disease Father 78  . Cancer Mother 36  . Diabetes Maternal Grandmother   . Cancer Maternal Grandfather        lung  . Healthy Child   . Suicidality Neg Hx   . Depression Neg Hx   . Dementia Neg Hx   . Anxiety disorder Neg Hx     Social History   Tobacco Use  . Smoking status: Former Smoker    Packs/day: 1.00    Years: 15.00    Pack years: 15.00    Types: Cigarettes  Start date: 09/27/1973    Quit date: 10/15/1988    Years since quitting: 30.8  . Smokeless tobacco: Never Used  . Tobacco comment: quit smoking 14 years ago  Substance Use Topics  . Alcohol use: No    Alcohol/week: 0.0 standard drinks  . Drug use: No    Home Medications Prior to Admission medications   Medication Sig Start Date End Date Taking? Authorizing Provider  albuterol (PROVENTIL HFA;VENTOLIN HFA) 108 (90 Base) MCG/ACT inhaler Inhale 2 puffs into the lungs every 6 (six) hours as needed for wheezing or shortness of breath. 11/05/17   Roxan Hockey, MD  albuterol (PROVENTIL) (2.5 MG/3ML) 0.083% nebulizer solution Take 3 mLs (2.5 mg total) by nebulization every 6 (six) hours as needed for wheezing or shortness of breath. 11/05/17   Roxan Hockey, MD  Apixaban Starter Pack (ELIQUIS DVT/PE STARTER PACK) 5 MG TBPK Take as directed on package: start with two-34m tablets twice daily for 7 days. On day 8, switch to one-584mtablet twice daily. 07/29/19   PoElodia Florence MD  benzonatate (TESSALON) 100 MG capsule Take 1 capsule (100 mg  total) by mouth 3 (three) times daily as needed for cough. 07/24/19   [place], EdPercell MillerPA-C  clopidogrel (PLAVIX) 75 MG tablet Take 1 tablet (75 mg total) by mouth daily. 05/13/19   Black, KaLezlie OctaveNP  darifenacin (ENABLEX) 7.5 MG 24 hr tablet Take 7.5 mg by mouth daily. 07/23/19   [provider]  famotidine (PEPCID) 40 MG tablet Take 1 tablet (40 mg total) by mouth daily. 02/12/19   LoRoma Schanz, DO  gabapentin (NEURONTIN) 800 MG tablet TAKE ONE TABLET BY MOUTH THREE TIMES DAILY ;; Patient taking differently: Take 800 mg by mouth 3 (three) times daily.  07/01/19   LoAnn HeldDO  glucose blood (ONETOUCH VERIO) test strip Use as instructed to check blood sugar 3 times a day 09/04/18   GhPhilemon KingdomMD  Hyoscyamine Sulfate SL (LEVSIN/SL) 0.125 MG SUBL 1-2 SL q4h prn diarrhea Patient not taking: Reported on 07/25/2019 05/23/19   LoRoma Schanz, DO  Insulin Pen Needle (B-D UF III MINI PEN NEEDLES) 31G X 5 MM MISC Use to inject insulin once a day. 01/07/19   GhPhilemon KingdomMD  ipratropium-albuterol (DUONEB) 0.5-2.5 (3) MG/3ML SOLN Take 3 mLs by nebulization every 6 (six) hours as needed. Patient not taking: Reported on 07/25/2019 05/26/17   LoCarollee HerterYvKendrick Fries, DO  isosorbide mononitrate (IMDUR) 30 MG 24 hr tablet TAKE ONE HALF TABLET BY MOUTH EVERY DAY PATIENT NEEDS APPOINTMENT BEFORE NEXT REFILL. Patient taking differently: Take 15 mg by mouth daily.  07/01/19   LaLendon ColonelNP  lidocaine-prilocaine (EMLA) cream Apply 1 application topically as needed. Patient taking differently: Apply 1 application topically as needed (to numb port site.).  02/28/19   EnVolanda NapoleonMD  metFORMIN (GLUCOPHAGE) 500 MG tablet Take 1 tablet (500 mg total) by mouth 2 (two) times daily with a meal. 02/13/19   GhPhilemon KingdomMD  metoprolol tartrate (LOPRESSOR) 25 MG tablet Take 1 tablet (25 mg total) by mouth 2 (two) times daily. 01/14/19   BeLorretta HarpMD    Multiple Vitamins-Minerals (CENTRUM SILVER ADULT 50+) TABS Take 1 tablet once a day 02/20/19   LoCarollee HerterYvAlferd ApaDO  nitroGLYCERIN (NITROSTAT) 0.4 MG SL tablet Place 1 tablet (0.4 mg total) under the tongue every 5 (five) minutes as needed. Patient taking differently: Place 0.4 mg under  the tongue every 5 (five) minutes as needed for chest pain.  01/14/19   Lorretta Harp, MD  nystatin (MYCOSTATIN) 100000 UNIT/ML suspension 5 ml po swish and spit qid Patient taking differently: Use as directed 5 mLs in the mouth or throat 4 (four) times daily. Swish and spit 5 mL by mouth 4 times daily 07/03/19   Carollee Herter, Kendrick Fries R, DO  ondansetron (ZOFRAN) 4 MG tablet TAKE ONE TABLET BY MOUTH EVERY EIGHT HOURS AS NEEDED FOR NAUSEA AND VOMITING Patient not taking: Reported on 07/25/2019 07/01/19   Volanda Napoleon, MD  pantoprazole (PROTONIX) 40 MG tablet Take 1 tablet (40 mg total) by mouth daily. 07/16/19   Roma Schanz R, DO  rosuvastatin (CRESTOR) 40 MG tablet TAKE ONE TABLET BY MOUTH EVERY DAY AT BEDTIME Patient taking differently: Take 40 mg by mouth daily.  03/01/19   Leonie Man, MD  sertraline (ZOLOFT) 50 MG tablet Take 1 tablet (50 mg total) by mouth daily. 02/20/19   Roma Schanz R, DO  sucralfate (CARAFATE) 1 g tablet TAKE 1 TABLET BY MOUTH TWICE DAILY Patient taking differently: Take 1 g by mouth 2 (two) times daily.  02/05/19   [place], Holli Humbles, NP  traMADol (ULTRAM) 50 MG tablet TAKE TWO TABLETS BY MOUTH THREE TIMES DAILY AS NEEDED FOR MODERATE PAIN Patient taking differently: Take 100 mg by mouth 3 (three) times daily as needed for moderate pain.  04/05/19   Ann Held, DO  traZODone (DESYREL) 50 MG tablet Take 0.5-1 tablets (25-50 mg total) by mouth at bedtime as needed for sleep. Patient not taking: Reported on 07/25/2019 05/23/19   Roma Schanz R, DO    Allergies    Adhesive [tape], Codeine, Zocor [simvastatin - high dose], and Penicillins  Review  of Systems   Review of Systems  Constitutional: Negative for fever.  Respiratory: Negative for shortness of breath.   Cardiovascular: Negative for chest pain.  All other systems reviewed and are negative.   Physical Exam Updated Vital Signs BP 98/69   Pulse 91   Temp 97.8 F (36.6 C) (Oral)   Resp 16   SpO2 93%   Physical Exam Vitals and nursing note reviewed.  Constitutional:      General: She is not in acute distress.    Appearance: Normal appearance. She is well-developed.  HENT:     Head: Normocephalic and atraumatic.  Eyes:     Conjunctiva/sclera: Conjunctivae normal.     Pupils: Pupils are equal, round, and reactive to light.  Cardiovascular:     Rate and Rhythm: Normal rate and regular rhythm.     Heart sounds: Normal heart sounds.  Pulmonary:     Effort: Pulmonary effort is normal. No respiratory distress.     Breath sounds: Normal breath sounds.  Abdominal:     General: There is no distension.     Palpations: Abdomen is soft.     Tenderness: There is no abdominal tenderness.  Musculoskeletal:        General: No deformity. Normal range of motion.     Cervical back: Normal range of motion and neck supple.  Skin:    General: Skin is warm and dry.  Neurological:     General: No focal deficit present.     Mental Status: She is alert and oriented to person, place, and time.     ED Results / Procedures / Treatments   Labs (all labs ordered are listed, but only abnormal  results are displayed) Labs Reviewed  CBC - Abnormal; Notable for the following components:      Result Value   WBC 14.4 (*)    Hemoglobin 11.6 (*)    HCT 33.8 (*)    RDW 17.2 (*)    Platelets 416 (*)    All other components within normal limits  BASIC METABOLIC PANEL  COMPREHENSIVE METABOLIC PANEL  LIPASE, BLOOD  CBG MONITORING, ED    EKG EKG Interpretation  Date/Time:  Thursday August 08 2019 13:07:53 EST Ventricular Rate:  88 PR Interval:    QRS Duration: 69 QT  Interval:  399 QTC Calculation: 483 R Axis:   -31 Text Interpretation: Sinus rhythm Left axis deviation Borderline T wave abnormalities Confirmed by Dene Gentry [phone]) on 08/08/2019 1:36:50 PM   Radiology No results found.  Procedures Procedures (including critical care time)  Medications Ordered in ED Medications - No data to display  ED Course  I have reviewed the triage vital signs and the nursing notes.  Pertinent labs & imaging results that were available during my care of the patient were reviewed by me and considered in my medical decision making (see chart for details).    MDM Rules/Calculators/A&P                      MDM  Screen complete  [name] was evaluated in Emergency Department on 08/08/2019 for the symptoms described in the history of present illness. She was evaluated in the context of the global COVID-19 pandemic, which necessitated consideration that the patient might be at risk for infection with the SARS-CoV-2 virus that causes COVID-19. Institutional protocols and algorithms that pertain to the evaluation of patients at risk for COVID-19 are in a state of rapid change based on information released by regulatory bodies including the CDC and federal and state organizations. These policies and algorithms were followed during the patient's care in the ED.  Patient is presenting for evaluation of nausea, diarrhea, and suspected dehydration.  Patient with recent admission for COVID-19.  She is denying current shortness of breath or cough.  Patient's labs suggest significant dehydration with acute kidney injury.  Patient would benefit from admission.  Hospitalist service Ninfa Linden) is aware of case and will evaluate for same. CT AP pending at time of admission.   Final Clinical Impression(s) / ED Diagnoses Final diagnoses:  Dehydration  AKI (acute kidney injury) [facility])    Rx / DC Orders ED Discharge Orders    None       Valarie Merino,  MD 08/08/19 1505

## 2019-08-08 NOTE — H&P (Addendum)
History and Physical    [name] QQP:[id number] DOB: 20-Dec-1950 DOA: 08/08/2019  Referring MD/NP/PA:  PCP: Ann Held, DO   Outpatient Specialists:  Patient coming from: home  Chief Complaint: generalized weakness/fall/diarrhea  HPI: [name] is a 69 y.o. female with medical history significant of .  Diabetes type 2, hyperlipidemia, asthma not on home oxygen, recent hospitalization for Covid infection presents to the emergency department chief complaint generalized weakness/fall/ loose stool loss of appetite and nausea.  She was discharged from the hospital January 4 feeling better during her time at home she has had no appetite she tries to eat and then feels nauseated.  She denies any nausea between meals.  He denies any vomiting.  She denies any abdominal pain bloating constipation.  She reports 1 mechanical fall without loss of consciousness or injury yesterday.  She reports feeling very weak not having eaten for the last 2 days.  She denies headache dizziness syncope or near syncope.  She denies chest pain palpitations shortness of breath fever chills.  She denies any dysuria hematuria frequency or urgency.  She denies cough.  Reports her stool was very soft like pudding and had a yellow color to it.  ED Course: Work-up in the emergency department reveals acute kidney injury superimposed on chronic kidney disease stage III likely secondary to dehydration in the setting of decreased appetite and persistent nausea.  Chest x-ray reveals central catheter position unchanged without pneumothorax small right pleural effusion no edema or consolidation stable cardiac silhouette.  She complained to the emergency department about abdominal pain which she denied for me.  CT of the abdomen results are pending.  She is afebrile hemodynamically stable and nontoxic-appearing.  She does have an elevated lactic acid for which she received 2 L normal saline. WBC 14.4. urinalysis with rare bacteria,  21-50 WBC, neg leukocytes  Review of Systems: As per HPI otherwise 10 point review of systems negative.    Past Medical History:  Diagnosis Date  . Asthma   . Degenerative joint disease   . Diabetes mellitus   . Dyspnea   . GERD (gastroesophageal reflux disease)   . Hyperlipidemia    no longer on medication for this  . Hypertension    patient denies  . Myocardial infarction ([place]) 08/2018  . Neuropathy    bilateral hands and feet  . Sickle cell trait ([place])   . Sleep apnea   . stomach ca dx'd 2010   chemo/xrt comp 05/2009  . TIA (transient ischemic attack) 07/2015    Past Surgical History:  Procedure Laterality Date  . APPENDECTOMY    . CESAREAN SECTION    . COLON SURGERY     colonscopy  . COLONOSCOPY WITH PROPOFOL N/A 06/01/2016   Procedure: COLONOSCOPY WITH PROPOFOL;  Surgeon: Wilford Corner, MD;  Location: [facility] ENDOSCOPY;  Service: Endoscopy;  Laterality: N/A;  . CORONARY STENT INTERVENTION N/A 08/29/2018   Procedure: CORONARY STENT INTERVENTION;  Surgeon: Wellington Hampshire, MD;  Location: [place] CV LAB;  Service: Cardiovascular;  Laterality: N/A;  . CORONARY/GRAFT ACUTE MI REVASCULARIZATION N/A 08/27/2018   Procedure: Coronary/Graft Acute MI Revascularization;  Surgeon: Leonie Man, MD;  Location: [place] CV LAB;  Service: Cardiovascular;  Laterality: N/A;  . ESOPHAGOGASTRODUODENOSCOPY N/A 10/15/2012   Procedure: ESOPHAGOGASTRODUODENOSCOPY (EGD);  Surgeon: Lear Ng, MD;  Location: [facility];  Service: Endoscopy;  Laterality: N/A;  . ESOPHAGOGASTRODUODENOSCOPY N/A 01/15/2016   Procedure: ESOPHAGOGASTRODUODENOSCOPY (EGD);  Surgeon: Ronald Lobo, MD;  Location: [facility] ENDOSCOPY;  Service: Endoscopy;  Laterality: N/A;  . HERNIA REPAIR    . LEFT HEART CATH AND CORONARY ANGIOGRAPHY N/A 08/27/2018   Procedure: LEFT HEART CATH AND CORONARY ANGIOGRAPHY;  Surgeon: Leonie Man, MD;  Location: [place] CV LAB;  Service: Cardiovascular;  Laterality: N/A;  .  SHOULDER SURGERY     Left  . TOTAL HIP ARTHROPLASTY     Right  . TOTAL KNEE ARTHROPLASTY     Left     reports that she quit smoking about 30 years ago. Her smoking use included cigarettes. She started smoking about 45 years ago. She has a 15.00 pack-year smoking history. She has never used smokeless tobacco. She reports that she does not drink alcohol or use drugs.  Allergies  Allergen Reactions  . Adhesive [Tape] Other (See Comments)    Burn Skin  . Codeine Other (See Comments)    paranoid  . Zocor [Simvastatin - High Dose] Other (See Comments)    Muscle spasms  . Penicillins Rash    Has patient had a PCN reaction causing immediate rash, facial/tongue/throat swelling, SOB or lightheadedness with hypotension: Yes Has patient had a PCN reaction causing severe rash involving mucus membranes or skin necrosis: No Has patient had a PCN reaction that required hospitalization does not remember.  Has patient had a PCN reaction occurring within the last 10 years: childhood If all of the above answers are "NO", then may proceed with Cephalosporin use.     Family History  Problem Relation Age of Onset  . Diabetes Brother   . Alzheimer's disease Father 73  . Cancer Mother 49  . Diabetes Maternal Grandmother   . Cancer Maternal Grandfather        lung  . Healthy Child   . Suicidality Neg Hx   . Depression Neg Hx   . Dementia Neg Hx   . Anxiety disorder Neg Hx      Prior to Admission medications   Medication Sig Start Date End Date Taking? Authorizing Provider  albuterol (PROVENTIL HFA;VENTOLIN HFA) 108 (90 Base) MCG/ACT inhaler Inhale 2 puffs into the lungs every 6 (six) hours as needed for wheezing or shortness of breath. 11/05/17   Roxan Hockey, MD  albuterol (PROVENTIL) (2.5 MG/3ML) 0.083% nebulizer solution Take 3 mLs (2.5 mg total) by nebulization every 6 (six) hours as needed for wheezing or shortness of breath. 11/05/17   Roxan Hockey, MD  Apixaban Starter Pack (ELIQUIS  DVT/PE STARTER PACK) 5 MG TBPK Take as directed on package: start with two-3m tablets twice daily for 7 days. On day 8, switch to one-564mtablet twice daily. 07/29/19   PoElodia Florence MD  benzonatate (TESSALON) 100 MG capsule Take 1 capsule (100 mg total) by mouth 3 (three) times daily as needed for cough. 07/24/19   S[place]ier, EdPercell MillerPA-C  clopidogrel (PLAVIX) 75 MG tablet Take 1 tablet (75 mg total) by mouth daily. 05/13/19   Jeannifer Drakeford, KaLezlie OctaveNP  darifenacin (ENABLEX) 7.5 MG 24 hr tablet Take 7.5 mg by mouth daily. 07/23/19   [provider]  famotidine (PEPCID) 40 MG tablet Take 1 tablet (40 mg total) by mouth daily. 02/12/19   LoRoma Schanz, DO  gabapentin (NEURONTIN) 800 MG tablet TAKE ONE TABLET BY MOUTH THREE TIMES DAILY ;; Patient taking differently: Take 800 mg by mouth 3 (three) times daily.  07/01/19   LoRoma Schanz, DO  glucose blood (ONETOUCH VERIO) test strip Use as instructed to  check blood sugar 3 times a day 09/04/18   Philemon Kingdom, MD  Hyoscyamine Sulfate SL (LEVSIN/SL) 0.125 MG SUBL 1-2 SL q4h prn diarrhea Patient not taking: Reported on 07/25/2019 05/23/19   Roma Schanz R, DO  Insulin Pen Needle (B-D UF III MINI PEN NEEDLES) 31G X 5 MM MISC Use to inject insulin once a day. 01/07/19   Philemon Kingdom, MD  ipratropium-albuterol (DUONEB) 0.5-2.5 (3) MG/3ML SOLN Take 3 mLs by nebulization every 6 (six) hours as needed. Patient not taking: Reported on 07/25/2019 05/26/17   Carollee Herter, Kendrick Fries R, DO  isosorbide mononitrate (IMDUR) 30 MG 24 hr tablet TAKE ONE HALF TABLET BY MOUTH EVERY DAY PATIENT NEEDS APPOINTMENT BEFORE NEXT REFILL. Patient taking differently: Take 15 mg by mouth daily.  07/01/19   Lendon Colonel, NP  lidocaine-prilocaine (EMLA) cream Apply 1 application topically as needed. Patient taking differently: Apply 1 application topically as needed (to numb port site.).  02/28/19   Volanda Napoleon, MD  metFORMIN (GLUCOPHAGE) 500  MG tablet Take 1 tablet (500 mg total) by mouth 2 (two) times daily with a meal. 02/13/19   Philemon Kingdom, MD  metoprolol tartrate (LOPRESSOR) 25 MG tablet Take 1 tablet (25 mg total) by mouth 2 (two) times daily. 01/14/19   Lorretta Harp, MD  Multiple Vitamins-Minerals (CENTRUM SILVER ADULT 50+) TABS Take 1 tablet once a day 02/20/19   Carollee Herter, Alferd Apa, DO  nitroGLYCERIN (NITROSTAT) 0.4 MG SL tablet Place 1 tablet (0.4 mg total) under the tongue every 5 (five) minutes as needed. Patient taking differently: Place 0.4 mg under the tongue every 5 (five) minutes as needed for chest pain.  01/14/19   Lorretta Harp, MD  nystatin (MYCOSTATIN) 100000 UNIT/ML suspension 5 ml po swish and spit qid Patient taking differently: Use as directed 5 mLs in the mouth or throat 4 (four) times daily. Swish and spit 5 mL by mouth 4 times daily 07/03/19   Carollee Herter, Kendrick Fries R, DO  ondansetron (ZOFRAN) 4 MG tablet TAKE ONE TABLET BY MOUTH EVERY EIGHT HOURS AS NEEDED FOR NAUSEA AND VOMITING Patient not taking: Reported on 07/25/2019 07/01/19   Volanda Napoleon, MD  pantoprazole (PROTONIX) 40 MG tablet Take 1 tablet (40 mg total) by mouth daily. 07/16/19   Roma Schanz R, DO  rosuvastatin (CRESTOR) 40 MG tablet TAKE ONE TABLET BY MOUTH EVERY DAY AT BEDTIME Patient taking differently: Take 40 mg by mouth daily.  03/01/19   Leonie Man, MD  sertraline (ZOLOFT) 50 MG tablet Take 1 tablet (50 mg total) by mouth daily. 02/20/19   Roma Schanz R, DO  sucralfate (CARAFATE) 1 g tablet TAKE 1 TABLET BY MOUTH TWICE DAILY Patient taking differently: Take 1 g by mouth 2 (two) times daily.  02/05/19   [place], Holli Humbles, NP  traMADol (ULTRAM) 50 MG tablet TAKE TWO TABLETS BY MOUTH THREE TIMES DAILY AS NEEDED FOR MODERATE PAIN Patient taking differently: Take 100 mg by mouth 3 (three) times daily as needed for moderate pain.  04/05/19   Ann Held, DO  traZODone (DESYREL) 50 MG tablet Take 0.5-1  tablets (25-50 mg total) by mouth at bedtime as needed for sleep. Patient not taking: Reported on 07/25/2019 05/23/19   Ann Held, DO    Physical Exam: Vitals:   08/08/19 1255 08/08/19 1300 08/08/19 1420 08/08/19 1440  BP: 100/60 98/69 92/65  106/63  Pulse: 86 91 87 79  Resp: 16 16 18  18  Temp: 97.8 F (36.6 C)     TempSrc: Oral     SpO2: 93%  98% 100%      Constitutional: NAD, calm, comfortable Vitals:   08/08/19 1255 08/08/19 1300 08/08/19 1420 08/08/19 1440  BP: 100/60 98/69 92/65  106/63  Pulse: 86 91 87 79  Resp: 16 16 18 18   Temp: 97.8 F (36.6 C)     TempSrc: Oral     SpO2: 93%  98% 100%   Eyes: PERRL, lids and conjunctivae normal ENMT: Mucous membranes of her mouth are pink slightly dry.  Posterior pharynx clear of any exudate or lesions.Normal dentition.  Neck: normal, supple, no masses, no thyromegaly Respiratory: clear to auscultation bilaterally, no wheezing, no crackles. Normal respiratory effort. No accessory muscle use.  Cardiovascular: Regular rate and rhythm, no murmurs / rubs / gallops. No extremity edema. 2+ pedal pulses. No carotid bruits.  Abdomen: no tenderness, no masses palpated. No hepatosplenomegaly. Bowel sounds positive.  Musculoskeletal: no clubbing / cyanosis. No joint deformity upper and lower extremities. Good ROM, no contractures. Normal muscle tone.  Skin: no rashes, lesions, ulcers. No induration Neurologic: CN 2-12 grossly intact. Sensation intact, DTR normal. Strength 5/5 in all 4.  Psychiatric: Normal judgment and insight. Alert and oriented x 3.  She seems slightly anxious.   Labs on Admission: I have personally reviewed following labs and imaging studies  CBC: Recent Labs  Lab 08/08/19 1323  WBC 14.4*  HGB 11.6*  HCT 33.8*  MCV 84.1  PLT 734*   Basic Metabolic Panel: Recent Labs  Lab 08/08/19 1400  NA 141  K 4.5  CL 109  CO2 20*  GLUCOSE 139*  BUN 24*  CREATININE 2.34*  CALCIUM 7.6*   GFR: Estimated  Creatinine Clearance: 27.3 mL/min (A) (by C-G formula based on SCr of 2.34 mg/dL (H)). Liver Function Tests: Recent Labs  Lab 08/08/19 1400  AST 176*  ALT 68*  ALKPHOS 122  BILITOT 1.2  PROT 5.3*  ALBUMIN 2.0*   Recent Labs  Lab 08/08/19 1400  LIPASE 11   No results for input(s): AMMONIA in the last 168 hours. Coagulation Profile: No results for input(s): INR, PROTIME in the last 168 hours. Cardiac Enzymes: No results for input(s): CKTOTAL, CKMB, CKMBINDEX, TROPONINI in the last 168 hours. BNP (last 3 results) No results for input(s): PROBNP in the last 8760 hours. HbA1C: No results for input(s): HGBA1C in the last 72 hours. CBG: Recent Labs  Lab 08/08/19 1356  GLUCAP 125*   Lipid Profile: No results for input(s): CHOL, HDL, LDLCALC, TRIG, CHOLHDL, LDLDIRECT in the last 72 hours. Thyroid Function Tests: No results for input(s): TSH, T4TOTAL, FREET4, T3FREE, THYROIDAB in the last 72 hours. Anemia Panel: No results for input(s): VITAMINB12, FOLATE, FERRITIN, TIBC, IRON, RETICCTPCT in the last 72 hours. Urine analysis:    Component Value Date/Time   COLORURINE YELLOW 07/26/2019 [place] 07/26/2019 0454   LABSPEC 1.006 07/26/2019 0454   LABSPEC 1.015 04/06/2015 0939   PHURINE 5.0 07/26/2019 0454   GLUCOSEU NEGATIVE 07/26/2019 0454   HGBUR SMALL (A) 07/26/2019 0454   BILIRUBINUR NEGATIVE 07/26/2019 0454   BILIRUBINUR 1+ 10/01/2018 1146   KETONESUR NEGATIVE 07/26/2019 0454   PROTEINUR NEGATIVE 07/26/2019 0454   UROBILINOGEN 0.2 10/01/2018 1146   UROBILINOGEN 0.2 04/06/2015 0939   NITRITE NEGATIVE 07/26/2019 0454   LEUKOCYTESUR NEGATIVE 07/26/2019 0454   Sepsis Labs: @LABRCNTIP (procalcitonin:4,lacticidven:4) )No results found for this or any previous visit (from the past 240 hour(s)).  Radiological Exams on Admission: DG Chest Port 1 View  Result Date: 08/08/2019 CLINICAL DATA:  Shortness of breath EXAM: PORTABLE CHEST 1 VIEW COMPARISON:   July 29, 2019 FINDINGS: Port-A-Cath tip is in the superior vena cava. No pneumothorax. There is a small right pleural effusion. There is no edema or consolidation. Heart is upper normal in size with pulmonary vascularity normal. No evident adenopathy. There is degenerative change in the left shoulder. IMPRESSION: Central catheter position unchanged without pneumothorax. Small right pleural effusion. No edema or consolidation. Stable cardiac silhouette. No demonstrable adenopathy. Electronically Signed   By: Lowella Grip III M.D.   On: 08/08/2019 14:38    EKG: Independently reviewed. nsr  Assessment/Plan Active Problems:   Nausea   Diarrhea   Acute kidney injury superimposed on chronic kidney disease (HCC)   Weakness   Type 2 diabetes with nephropathy (HCC)   CAD (coronary artery disease)   Fall   Chronic pain syndrome Hypertension asthma  #1.  Acute kidney injury superimposed on chronic kidney disease stage III secondary to dehydration in the setting of decreased oral intake due to persistent nausea as well as diarrhea.  Creatinine 2.3.  Baseline is closer to 1.2.  At discharge 10 days ago creatinine was 1.3. -Hold nephrotoxins -Gentle IV fluids -Monitor urine output -Recheck in the morning  #2.  Nausea/diarrhea.  Patient denies nausea between meals but every time she fixes her cell something to eat she has a wave of nausea takes 2 bites and unable to continue.  Denies abdominal pain has had 2 episodes of loose stool as well.  He did report to the ED physician she had abdominal pain so CT of the abdomen results are pending. -We will provide scheduled Zofran every 6 hours for the next 24 hours -We will provide clear liquid diet to be advanced as tolerated -GI pathogen panel  #3.  Generalized weakness/fall.  Likely as a result of #1 and #2 in the setting of recent hospitalization for Covid virus. -See #1 and #2 -We will have physical therapy evaluate make  recommendations -Mobilize as able  #4.  Type 2 diabetes.  Glucose 139.  Poorly controlled.  Hemoglobin A1c 2 weeks ago was 8.7.  Home regimen includes Glucophage and insulin.  - Will hold oral agents for now.  - We will use sliding scale insulin for optimal control.  #5.  Hypertension.  Blood pressure is low end of normal while in the emergency department.  Home medications include Lopressor, imdur, -We will hold home medications for now -Monitor blood pressure closely -Resume home meds when indicated  #6.  History of stroke. -continue plavix  7.  CAD.  No chest pain.  EKG as noted above. -hold imdur for now  8. Leukocytosis. WBC 14. Urine as noted above, chest xray as noted above. Elevated lactic acid likely related to dehydration. She is afebrile and non-toxic appearing -continue IV fluids -follow repeat lactic acid -vitals every 4 hours x3 -recheck in am.   9. Chronic pain syndrome. Reports pain "on my bottom" but sates otherwise her chronic pain is at baseline. homemeds include neurontin and ultram.  -continue neruontin at lower dose -hold utram for now  10. Asthma. Not on home oxygen. Chest xray as noted above. Oxygen saturation level greater than 90% on room air. Home meds include duonebs prn, albuterol inhaler prn,   DVT prophylaxis:  scd Code Status: full Family Communication: patient Disposition Plan: home Consults called:  Admission status: obs   [facility] M  NP [facility]   If 7PM-7AM, please contact night-coverage [email or web address] Password [facility]  08/08/2019, 3:29 PM

## 2019-08-08 NOTE — ED Notes (Signed)
[name], daughter, [phone] wants updates on her mother.

## 2019-08-08 NOTE — Progress Notes (Signed)
Pharmacy: Renal dose adjustments CrCl ~ 28 ml/min  Pepcid 40 qd>> reduced to 20 qd LMWH 40 qd>>reduced to 30 qd but pt was on Eliquis PTA for new UE DVT  Eudelia Bunch, Pharm.D 08/08/2019 9:01 PM

## 2019-08-08 NOTE — ED Notes (Signed)
Patient transported to CT 

## 2019-08-08 NOTE — ED Notes (Addendum)
Pt reported feeling "shaky" when standing for orthostatic vital signs. No other symptoms reported.

## 2019-08-09 ENCOUNTER — Telehealth: Payer: Self-pay | Admitting: *Deleted

## 2019-08-09 ENCOUNTER — Ambulatory Visit: Payer: Medicare HMO | Admitting: Family Medicine

## 2019-08-09 DIAGNOSIS — G894 Chronic pain syndrome: Secondary | ICD-10-CM | POA: Diagnosis not present

## 2019-08-09 DIAGNOSIS — Z833 Family history of diabetes mellitus: Secondary | ICD-10-CM | POA: Diagnosis not present

## 2019-08-09 DIAGNOSIS — D573 Sickle-cell trait: Secondary | ICD-10-CM | POA: Diagnosis present

## 2019-08-09 DIAGNOSIS — R110 Nausea: Secondary | ICD-10-CM | POA: Diagnosis not present

## 2019-08-09 DIAGNOSIS — E1121 Type 2 diabetes mellitus with diabetic nephropathy: Secondary | ICD-10-CM

## 2019-08-09 DIAGNOSIS — K760 Fatty (change of) liver, not elsewhere classified: Secondary | ICD-10-CM | POA: Diagnosis not present

## 2019-08-09 DIAGNOSIS — N179 Acute kidney failure, unspecified: Secondary | ICD-10-CM | POA: Diagnosis not present

## 2019-08-09 DIAGNOSIS — R519 Headache, unspecified: Secondary | ICD-10-CM | POA: Diagnosis not present

## 2019-08-09 DIAGNOSIS — Z96641 Presence of right artificial hip joint: Secondary | ICD-10-CM | POA: Diagnosis not present

## 2019-08-09 DIAGNOSIS — R6510 Systemic inflammatory response syndrome (SIRS) of non-infectious origin without acute organ dysfunction: Secondary | ICD-10-CM | POA: Diagnosis not present

## 2019-08-09 DIAGNOSIS — S79921A Unspecified injury of right thigh, initial encounter: Secondary | ICD-10-CM | POA: Diagnosis not present

## 2019-08-09 DIAGNOSIS — U071 COVID-19: Secondary | ICD-10-CM | POA: Diagnosis not present

## 2019-08-09 DIAGNOSIS — R531 Weakness: Secondary | ICD-10-CM | POA: Diagnosis not present

## 2019-08-09 DIAGNOSIS — J45909 Unspecified asthma, uncomplicated: Secondary | ICD-10-CM | POA: Diagnosis present

## 2019-08-09 DIAGNOSIS — E1140 Type 2 diabetes mellitus with diabetic neuropathy, unspecified: Secondary | ICD-10-CM | POA: Diagnosis present

## 2019-08-09 DIAGNOSIS — Z96652 Presence of left artificial knee joint: Secondary | ICD-10-CM | POA: Diagnosis present

## 2019-08-09 DIAGNOSIS — N189 Chronic kidney disease, unspecified: Secondary | ICD-10-CM | POA: Diagnosis not present

## 2019-08-09 DIAGNOSIS — I2510 Atherosclerotic heart disease of native coronary artery without angina pectoris: Secondary | ICD-10-CM | POA: Diagnosis present

## 2019-08-09 DIAGNOSIS — R1010 Upper abdominal pain, unspecified: Secondary | ICD-10-CM | POA: Diagnosis not present

## 2019-08-09 DIAGNOSIS — S79922A Unspecified injury of left thigh, initial encounter: Secondary | ICD-10-CM | POA: Diagnosis not present

## 2019-08-09 DIAGNOSIS — W19XXXS Unspecified fall, sequela: Secondary | ICD-10-CM | POA: Diagnosis not present

## 2019-08-09 DIAGNOSIS — Z8673 Personal history of transient ischemic attack (TIA), and cerebral infarction without residual deficits: Secondary | ICD-10-CM | POA: Diagnosis not present

## 2019-08-09 DIAGNOSIS — I252 Old myocardial infarction: Secondary | ICD-10-CM | POA: Diagnosis not present

## 2019-08-09 DIAGNOSIS — E860 Dehydration: Secondary | ICD-10-CM | POA: Diagnosis not present

## 2019-08-09 DIAGNOSIS — I951 Orthostatic hypotension: Secondary | ICD-10-CM | POA: Diagnosis not present

## 2019-08-09 DIAGNOSIS — Z7901 Long term (current) use of anticoagulants: Secondary | ICD-10-CM | POA: Diagnosis not present

## 2019-08-09 DIAGNOSIS — E876 Hypokalemia: Secondary | ICD-10-CM | POA: Diagnosis present

## 2019-08-09 DIAGNOSIS — M79651 Pain in right thigh: Secondary | ICD-10-CM | POA: Diagnosis not present

## 2019-08-09 DIAGNOSIS — Z85028 Personal history of other malignant neoplasm of stomach: Secondary | ICD-10-CM | POA: Diagnosis not present

## 2019-08-09 DIAGNOSIS — I10 Essential (primary) hypertension: Secondary | ICD-10-CM | POA: Diagnosis not present

## 2019-08-09 DIAGNOSIS — E872 Acidosis: Secondary | ICD-10-CM | POA: Diagnosis not present

## 2019-08-09 DIAGNOSIS — R197 Diarrhea, unspecified: Secondary | ICD-10-CM | POA: Diagnosis not present

## 2019-08-09 DIAGNOSIS — E785 Hyperlipidemia, unspecified: Secondary | ICD-10-CM | POA: Diagnosis present

## 2019-08-09 DIAGNOSIS — Z86718 Personal history of other venous thrombosis and embolism: Secondary | ICD-10-CM | POA: Diagnosis not present

## 2019-08-09 DIAGNOSIS — E1122 Type 2 diabetes mellitus with diabetic chronic kidney disease: Secondary | ICD-10-CM | POA: Diagnosis present

## 2019-08-09 DIAGNOSIS — I25118 Atherosclerotic heart disease of native coronary artery with other forms of angina pectoris: Secondary | ICD-10-CM | POA: Diagnosis not present

## 2019-08-09 DIAGNOSIS — N183 Chronic kidney disease, stage 3 unspecified: Secondary | ICD-10-CM | POA: Diagnosis not present

## 2019-08-09 DIAGNOSIS — M79652 Pain in left thigh: Secondary | ICD-10-CM | POA: Diagnosis not present

## 2019-08-09 DIAGNOSIS — Z8616 Personal history of COVID-19: Secondary | ICD-10-CM | POA: Diagnosis not present

## 2019-08-09 DIAGNOSIS — I129 Hypertensive chronic kidney disease with stage 1 through stage 4 chronic kidney disease, or unspecified chronic kidney disease: Secondary | ICD-10-CM | POA: Diagnosis not present

## 2019-08-09 DIAGNOSIS — R1030 Lower abdominal pain, unspecified: Secondary | ICD-10-CM | POA: Diagnosis not present

## 2019-08-09 LAB — CBC
HCT: 27.5 % — ABNORMAL LOW (ref 36.0–46.0)
Hemoglobin: 9.6 g/dL — ABNORMAL LOW (ref 12.0–15.0)
MCH: 28.7 pg (ref 26.0–34.0)
MCHC: 34.9 g/dL (ref 30.0–36.0)
MCV: 82.3 fL (ref 80.0–100.0)
Platelets: 311 10*3/uL (ref 150–400)
RBC: 3.34 MIL/uL — ABNORMAL LOW (ref 3.87–5.11)
RDW: 17.4 % — ABNORMAL HIGH (ref 11.5–15.5)
WBC: 10.9 10*3/uL — ABNORMAL HIGH (ref 4.0–10.5)
nRBC: 0 % (ref 0.0–0.2)

## 2019-08-09 LAB — BASIC METABOLIC PANEL
Anion gap: 10 (ref 5–15)
BUN: 22 mg/dL (ref 8–23)
CO2: 20 mmol/L — ABNORMAL LOW (ref 22–32)
Calcium: 7 mg/dL — ABNORMAL LOW (ref 8.9–10.3)
Chloride: 111 mmol/L (ref 98–111)
Creatinine, Ser: 1.91 mg/dL — ABNORMAL HIGH (ref 0.44–1.00)
GFR calc Af Amer: 31 mL/min — ABNORMAL LOW (ref >60)
GFR calc non Af Amer: 27 mL/min — ABNORMAL LOW (ref >60)
Glucose, Bld: 153 mg/dL — ABNORMAL HIGH (ref 70–99)
Potassium: 3.2 mmol/L — ABNORMAL LOW (ref 3.5–5.1)
Sodium: 141 mmol/L (ref 135–145)

## 2019-08-09 LAB — GLUCOSE, CAPILLARY
Glucose-Capillary: 107 mg/dL — ABNORMAL HIGH (ref 70–99)
Glucose-Capillary: 165 mg/dL — ABNORMAL HIGH (ref 70–99)
Glucose-Capillary: 193 mg/dL — ABNORMAL HIGH (ref 70–99)
Glucose-Capillary: 147 mg/dL — ABNORMAL HIGH (ref 70–99)

## 2019-08-09 MED ORDER — SODIUM CHLORIDE 0.9% FLUSH
10.0000 mL | Freq: Two times a day (BID) | INTRAVENOUS | Status: DC
  Administered 2019-08-09 – 2019-08-13 (×6): 10 mL

## 2019-08-09 MED ORDER — HEPARIN SODIUM (PORCINE) 5000 UNIT/ML IJ SOLN: 5000.0000 [IU] | Freq: Three times a day (TID) | INTRAMUSCULAR | Status: DC

## 2019-08-09 MED ORDER — SODIUM CHLORIDE 0.9% FLUSH
10.0000 mL | INTRAVENOUS | Status: DC | PRN
  Administered 2019-08-14: 10 mL

## 2019-08-09 MED ORDER — ENOXAPARIN SODIUM 40 MG/0.4ML ~~LOC~~ SOLN
40.0000 mg | Freq: Every day | SUBCUTANEOUS | Status: DC
  Filled 2019-08-09: qty 0.4

## 2019-08-09 MED ORDER — APIXABAN 5 MG PO TABS
5.0000 mg | ORAL_TABLET | Freq: Two times a day (BID) | ORAL | Status: DC
  Administered 2019-08-09 – 2019-08-14 (×10): 5 mg via ORAL
  Filled 2019-08-09 (×10): qty 1

## 2019-08-09 MED ORDER — CHLORHEXIDINE GLUCONATE CLOTH 2 % EX PADS
6.0000 | MEDICATED_PAD | Freq: Every day | CUTANEOUS | Status: DC
  Administered 2019-08-09 – 2019-08-14 (×6): 6 via TOPICAL

## 2019-08-09 NOTE — Telephone Encounter (Signed)
Pt has appointment for today

## 2019-08-09 NOTE — Evaluation (Signed)
Physical Therapy Evaluation Patient Details Name: [name] MRN: [id number] DOB: 06-07-1951 Today's Date: 08/09/2019   History of Present Illness  69 y.o. female with past medical history significant for DM2, HLD, Asthma admitted to ED on 1/14 with falls, covid PNA diagnosed originally on 12/31 with [facility] stay. CT abd/pelvis negative for acute abnormality.  Clinical Impression   Pt presents with LE weakness, difficulty performing mobility tasks, unsteadiness in standing with history of recent falls, and decreased activity tolerance. Sats WFL on RA, 96-100%. Pt to benefit from acute PT to address deficits. Pt ambulated short room distance with RW, requiring minassist for mobility tasks. PT feels pt needs 24/7 assist for safety, which is SNF level of care at this point. Pt is agreeable to SNF and wants to return to PLOF. PT to progress mobility as tolerated, and will continue to follow acutely.      Follow Up Recommendations SNF;Home health PT;Supervision/Assistance - 24 hour    Equipment Recommendations  None recommended by PT    Recommendations for Other Services       Precautions / Restrictions Precautions Precautions: Fall Restrictions Weight Bearing Restrictions: No      Mobility  Bed Mobility Overal bed mobility: Needs Assistance Bed Mobility: Supine to Sit     Supine to sit: Min assist;HOB elevated     General bed mobility comments: min assist for LE lifting and translation to EOB, especially RLE. Use of bedrails  Transfers Overall transfer level: Needs assistance Equipment used: Rolling walker (2 wheeled) Transfers: Sit to/from Stand Sit to Stand: Min assist;From elevated surface         General transfer comment: Min assist for power up, steadying, hand placement on RW. First attempt to stand unsuccessful as pt could not power up from low bed, raised bed to successfully complete task.  Ambulation/Gait Ambulation/Gait assistance: Min assist Gait Distance  (Feet): 10 Feet Assistive device: Rolling walker (2 wheeled) Gait Pattern/deviations: Step-through pattern;Decreased stride length;Trunk flexed Gait velocity: decr   General Gait Details: Min guard for safety, occasional min assist for steadying and guiding pt/RW. Verbal cuing for placement in RW. Sats WFL on RA during ambulation.  Stairs            Wheelchair Mobility    Modified Rankin (Stroke Patients Only)       Balance Overall balance assessment: Needs assistance Sitting-balance support: Feet supported Sitting balance-Leahy Scale: Good       Standing balance-Leahy Scale: Fair                               Pertinent Vitals/Pain Pain Assessment: 0-10 Pain Score: 8  Pain Location: back Pain Descriptors / Indicators: Grimacing;Discomfort Pain Intervention(s): Limited activity within patient's tolerance;Premedicated before session;Monitored during session;Repositioned    Home Living Family/patient expects to be discharged to:: Private residence Living Arrangements: Alone Available Help at Discharge: Other (Comment)(no one to assist. Has a fall alert system) Type of Home: Apartment Home Access: Stairs to enter Entrance Stairs-Rails: Right Entrance Stairs-Number of Steps: 1 Home Layout: One level Home Equipment: [place] - 4 wheels;Shower seat Additional Comments: pt states her children can assist intermittently, but not 24/7    Prior Function Level of Independence: Independent with assistive device(s)         Comments: Pt independent with ADLs, IADLs, and mobility. Pt reports using a 4WW at home. Pt reports 4 falls in the last 6 months. Pt states that she can  drive, however does not have a car. Pt reports that daughter taskes her grocery shopping.     Hand Dominance   Dominant Hand: Right    Extremity/Trunk Assessment   Upper Extremity Assessment Upper Extremity Assessment: Defer to OT evaluation LUE Deficits / Details: LUE shld flex ~80  degrees. LUE elbow, wrist, and digit ROM WFL.    Lower Extremity Assessment Lower Extremity Assessment: Generalized weakness    Cervical / Trunk Assessment Cervical / Trunk Assessment: Normal  Communication   Communication: No difficulties  Cognition Arousal/Alertness: Awake/alert Behavior During Therapy: WFL for tasks assessed/performed Overall Cognitive Status: Within Functional Limits for tasks assessed                                        General Comments General comments (skin integrity, edema, etc.): Sats 96-100% on RA with mobility    Exercises     Assessment/Plan    PT Assessment Patient needs continued PT services  PT Problem List Decreased range of motion;Decreased strength;Decreased activity tolerance;Decreased balance;Decreased mobility;Decreased coordination;Decreased safety awareness       PT Treatment Interventions Gait training;Functional mobility training;Therapeutic activities;Therapeutic exercise;Balance training;Neuromuscular re-education;Patient/family education;DME instruction    PT Goals (Current goals can be found in the Care Plan section)  Acute Rehab PT Goals Patient Stated Goal: go to rehab to get stronger PT Goal Formulation: With patient Time For Goal Achievement: 08/23/19 Potential to Achieve Goals: Good    Frequency Min 2X/week   Barriers to discharge Decreased caregiver support      Co-evaluation               AM-PAC PT "6 Clicks" Mobility  Outcome Measure Help needed turning from your back to your side while in a flat bed without using bedrails?: A Little Help needed moving from lying on your back to sitting on the side of a flat bed without using bedrails?: A Little Help needed moving to and from a bed to a chair (including a wheelchair)?: A Little Help needed standing up from a chair using your arms (e.g., wheelchair or bedside chair)?: A Little Help needed to walk in hospital room?: A Little Help needed  climbing 3-5 steps with a railing? : A Lot 6 Click Score: 17    End of Session Equipment Utilized During Treatment: Gait belt Activity Tolerance: Treatment limited secondary to medical complications (Comment);Patient limited by fatigue Patient left: in chair;with call bell/phone within reach;with nursing/sitter in room Nurse Communication: Mobility status PT Visit Diagnosis: Other abnormalities of gait and mobility (R26.89);Muscle weakness (generalized) (M62.81)    Time: 7893-8101 PT Time Calculation (min) (ACUTE ONLY): 25 min   Charges:   PT Evaluation $PT Eval Low Complexity: 1 Low PT Treatments $Gait Training: 8-22 mins       Hollye Pritt E, PT Acute Rehabilitation Services Pager [phone]  Office [phone]  Vielka Klinedinst D Elonda Husky 08/09/2019, 4:51 PM

## 2019-08-09 NOTE — Telephone Encounter (Signed)
Received a call from Williams, patients daughter.  Patient had fallen at home a few times and is currently hospitalized.  Wanted to elt Dr. Marin Olp know.  Told Alanna that Dr. Marin Olp is out of the office and will be back Monday.  Patient had a lot of questions. Advised her to call the floor and request a nurse to call her back.

## 2019-08-09 NOTE — Progress Notes (Signed)
PROGRESS NOTE    [name]  SWF:[id number] DOB: 10/16/1950 DOA: 08/08/2019 PCP: Ann Held, DO    Brief Narrative:  [name] is a 69 y.o. female with medical history significant of .  Diabetes type 2, hyperlipidemia, asthma not on home oxygen, recent hospitalization for Covid infection presents to the emergency department chief complaint generalized weakness/fall/ loose stool loss of appetite and nausea.  She was discharged from the hospital January 4 feeling better during her time at home she has had no appetite she tries to eat and then feels nauseated.  She denies any nausea between meals.  He denies any vomiting.  She denies any abdominal pain bloating constipation.  She reports 1 mechanical fall without loss of consciousness or injury yesterday.  She reports feeling very weak not having eaten for the last 2 days.  She denies headache dizziness syncope or near syncope.  She denies chest pain palpitations shortness of breath fever chills.  She denies any dysuria hematuria frequency or urgency.   Please see H&P for full details  Assessment & Plan:   Active Problems:   Nausea   Type 2 diabetes with nephropathy (HCC)   Chronic pain syndrome   Diarrhea   Acute kidney injury superimposed on chronic kidney disease (HCC)   Weakness   CAD (coronary artery disease)   Fall   Hypertension  Clinical problems list 1.  Nausea/diarrhea 2.  Generalized weakness/COVID-19 virus infection 3.  Type 2 diabetes mellitus 4.  Acute on chronic kidney injury 5.  Essential hypertension 6.  Chronic pain syndrome 7.  Coronary artery disease 8.  History of DVT   1.  Nausea/diarrhea.  Controlled with Zofran Patient tolerating p.o. intake.  No more diarrhea Continue gentle IV hydration Discontinue C. difficile assay  2.  Generalized weakness, COVID-19 virus infection. Patient initially placed on 2 L O2. Currently saturating greater than 90% on room air. Continue remdesivir and  dexamethasone Supportive therapy with zinc, vitamin C&D.  3.  Diabetes mellitus type 2 Continue with sliding scale insulin Continue with basal insulin Fingersticks before meals and at bedtime Hypoglycemic protocol  4.  Acute on chronic kidney disease.  Baseline creatinine 1.4-1.5 Serum creatinine on presentation was 2.34.  Likely secondary to dehydration from diarrhea. Monitor renal function and avoid nephrotoxic agents  5.  Essential hypertension.  Currently normotensive Continue with amlodipine  6.  Chronic pain syndrome Continue with gabapentin and tramadol  7.  Coronary artery disease.  Stable Continue with Plavix and statin  8.  History of DVT Continue with apixaban  DVT prophylaxis: Apixaban 5 mg twice daily  Code Status: Full code  Family Communication: None at bedside  Disposition Plan: Patient is from home Patient will be discharged home and no barriers to discharge. Expected date of discharge 08/11/2019  Consultants:   None  Procedures: None  Antimicrobials: Azithromycin Ceftriaxone Reevaluate antibiotics and de-escalate with blood culture report.    Subjective: Patient was seen and examined at bedside.  Not in acute distress.  On 2 L O2 by nasal cannula.  Patient admitted for generalized weakness.  Will get PT consult.  Objective: Vitals:   08/08/19 1709 08/08/19 2006 08/09/19 0204 08/09/19 0555  BP: (!) 85/69 (!) 105/44 (!) 93/54 101/68  Pulse: 82 79 89 85  Resp:  18 16 18   Temp: 98 F (36.7 C) 97.8 F (36.6 C) 98.3 F (36.8 C) 97.9 F (36.6 C)  TempSrc: Oral Oral Oral Oral  SpO2: 97% 100% 100%  100%  Weight: 87.1 kg     Height: 5' 6"  (1.676 m)       Intake/Output Summary (Last 24 hours) at 08/09/2019 1343 Last data filed at 08/09/2019 1000 Gross per 24 hour  Intake 2720 ml  Output 650 ml  Net 2070 ml   Filed Weights   08/08/19 1709  Weight: 87.1 kg    Examination:  General exam: Appears calm and comfortable  Respiratory system:  Clear to auscultation. Respiratory effort normal. Cardiovascular system: S1 & S2 heard, RRR. No JVD, murmurs, rubs, gallops or clicks. No pedal edema. Gastrointestinal system: Abdomen is nondistended, soft and nontender. No organomegaly or masses felt. Normal bowel sounds heard. Central nervous system: Alert and oriented. No focal neurological deficits. Extremities: Bilateral lower extremity weakness. Skin: No rashes, lesions or ulcers Psychiatry: Judgement and insight appear normal. Mood & affect appropriate.     Data Reviewed: I have personally reviewed following labs and imaging studies  CBC: Recent Labs  Lab 08/08/19 1323 08/08/19 1655 08/09/19 0108  WBC 14.4* 15.3* 10.9*  HGB 11.6* 10.6* 9.6*  HCT 33.8* 31.1* 27.5*  MCV 84.1 84.1 82.3  PLT 416* 361 947   Basic Metabolic Panel: Recent Labs  Lab 08/08/19 1400 08/08/19 1655 08/09/19 0108  NA 141  --  141  K 4.5  --  3.2*  CL 109  --  111  CO2 20*  --  20*  GLUCOSE 139*  --  153*  BUN 24*  --  22  CREATININE 2.34* 2.18* 1.91*  CALCIUM 7.6*  --  7.0*  MG  --  1.7  --    GFR: Estimated Creatinine Clearance: 31.8 mL/min (A) (by C-G formula based on SCr of 1.91 mg/dL (H)). Liver Function Tests: Recent Labs  Lab 08/08/19 1400  AST 176*  ALT 68*  ALKPHOS 122  BILITOT 1.2  PROT 5.3*  ALBUMIN 2.0*   Recent Labs  Lab 08/08/19 1400  LIPASE 11   No results for input(s): AMMONIA in the last 168 hours. Coagulation Profile: No results for input(s): INR, PROTIME in the last 168 hours. Cardiac Enzymes: No results for input(s): CKTOTAL, CKMB, CKMBINDEX, TROPONINI in the last 168 hours. BNP (last 3 results) No results for input(s): PROBNP in the last 8760 hours. HbA1C: No results for input(s): HGBA1C in the last 72 hours. CBG: Recent Labs  Lab 08/08/19 1356 08/08/19 1700 08/08/19 2055 08/09/19 0804 08/09/19 1146  GLUCAP 125* 91 217* 107* 165*   Lipid Profile: No results for input(s): CHOL, HDL, LDLCALC,  TRIG, CHOLHDL, LDLDIRECT in the last 72 hours. Thyroid Function Tests: No results for input(s): TSH, T4TOTAL, FREET4, T3FREE, THYROIDAB in the last 72 hours. Anemia Panel: No results for input(s): VITAMINB12, FOLATE, FERRITIN, TIBC, IRON, RETICCTPCT in the last 72 hours. Sepsis Labs: Recent Labs  Lab 08/08/19 1341 08/08/19 1655  LATICACIDVEN 2.8* 1.5    No results found for this or any previous visit (from the past 240 hour(s)).       Radiology Studies: CT ABDOMEN PELVIS WO CONTRAST  Result Date: 08/08/2019 CLINICAL DATA:  Nausea and vomiting. Hypotension. Weakness and dizziness. Diarrhea. COVID-19. EXAM: CT ABDOMEN AND PELVIS WITHOUT CONTRAST TECHNIQUE: Multidetector CT imaging of the abdomen and pelvis was performed following the standard protocol without IV contrast. COMPARISON:  CT scan dated 03/22/2019 FINDINGS: Lower chest: There are small patchy peripheral infiltrates at both lung bases consistent with COVID pneumonia. Heart size is normal. Coronary artery calcifications. Coronary stents. No pericardial effusion. Small hiatal hernia, chronic.  Hepatobiliary: Severe progressive hepatic steatosis. Gallbladder is distended. No dilated bile ducts. Pancreas: Diffuse pancreatic atrophy, unchanged. Otherwise negative. Spleen: Normal in size without focal abnormality. Adrenals/Urinary Tract: Normal adrenal glands. Slight left renal atrophy, chronic. Compensatory hypertrophy of the right kidney. No hydronephrosis. The visualized portion of the bladder is normal. Bladder detail is obscured by metallic artifact from the right hip prosthesis. Stomach/Bowel: Small hiatal hernia. Surgical staples in the stomach, duodenum, and at the tip of the cecum. Prior appendectomy. There are few diverticula in the sigmoid portion of the colon. No acute bowel abnormality. Vascular/Lymphatic: Aortic atherosclerosis. No enlarged abdominal or pelvic lymph nodes. Reproductive: The area of the uterus and ovaries are  obscured by metallic artifact. Other: No abdominal wall hernia or abnormality. No abdominopelvic ascites. Musculoskeletal: No acute abnormalities. IMPRESSION: 1. Small patchy peripheral infiltrates at both lung bases consistent with COVID pneumonia. 2. Severe progressive hepatic steatosis. 3. No acute abnormality of the abdomen and pelvis. 4. Aortic Atherosclerosis (ICD10-I70.0). Electronically Signed   By: Lorriane Shire M.D.   On: 08/08/2019 15:36   DG Chest Port 1 View  Result Date: 08/08/2019 CLINICAL DATA:  Shortness of breath EXAM: PORTABLE CHEST 1 VIEW COMPARISON:  July 29, 2019 FINDINGS: Port-A-Cath tip is in the superior vena cava. No pneumothorax. There is a small right pleural effusion. There is no edema or consolidation. Heart is upper normal in size with pulmonary vascularity normal. No evident adenopathy. There is degenerative change in the left shoulder. IMPRESSION: Central catheter position unchanged without pneumothorax. Small right pleural effusion. No edema or consolidation. Stable cardiac silhouette. No demonstrable adenopathy. Electronically Signed   By: Lowella Grip III M.D.   On: 08/08/2019 14:38        Scheduled Meds: . Chlorhexidine Gluconate Cloth  6 each Topical Daily  . clopidogrel  75 mg Oral Daily  . enoxaparin (LOVENOX) injection  40 mg Subcutaneous Daily  . famotidine  20 mg Oral Daily  . gabapentin  600 mg Oral BID  . insulin aspart  0-5 Units Subcutaneous QHS  . insulin aspart  0-9 Units Subcutaneous TID WC  . lidocaine-prilocaine   Topical Once  . ondansetron (ZOFRAN) IV  4 mg Intravenous Q6H  . rosuvastatin  40 mg Oral q1800  . sodium chloride flush  10-40 mL Intracatheter Q12H   Continuous Infusions: . sodium chloride 75 mL/hr at 08/09/19 0015     LOS: 0 days    Time spent: [place], MD [facility] Pager [phone]   If 7PM-7AM, please contact night-coverage [email or web address] Password [facility] 08/09/2019, 1:43 PM

## 2019-08-09 NOTE — Plan of Care (Signed)
  Problem: Education: Goal: Knowledge of General Education information will improve Description: Including pain rating scale, medication(s)/side effects and non-pharmacologic comfort measures Outcome: Progressing   Problem: Health Behavior/Discharge Planning: Goal: Ability to manage health-related needs will improve Outcome: Progressing   Problem: Clinical Measurements: Goal: Ability to maintain clinical measurements within normal limits will improve Outcome: Progressing Goal: Will remain free from infection Outcome: Progressing Goal: Diagnostic test results will improve Outcome: Progressing Goal: Respiratory complications will improve Outcome: Progressing Goal: Cardiovascular complication will be avoided Outcome: Progressing   Problem: Activity: Goal: Risk for activity intolerance will decrease Outcome: Progressing   Problem: Nutrition: Goal: Adequate nutrition will be maintained Outcome: Progressing   Problem: Coping: Goal: Level of anxiety will decrease Outcome: Progressing   Problem: Elimination: Goal: Will not experience complications related to bowel motility Outcome: Progressing Goal: Will not experience complications related to urinary retention Outcome: Progressing   Problem: Pain Managment: Goal: General experience of comfort will improve Outcome: Progressing   Problem: Safety: Goal: Ability to remain free from injury will improve Outcome: Progressing   Problem: Skin Integrity: Goal: Risk for impaired skin integrity will decrease Outcome: Progressing   Problem: Education: Goal: Ability to describe self-care measures that may prevent or decrease complications (Diabetes Survival Skills Education) will improve Outcome: Progressing Goal: Individualized Educational Video(s) Outcome: Progressing   Problem: Coping: Goal: Ability to adjust to condition or change in health will improve Outcome: Progressing   Problem: Fluid Volume: Goal: Ability to  maintain a balanced intake and output will improve Outcome: Progressing   Problem: Health Behavior/Discharge Planning: Goal: Ability to identify and utilize available resources and services will improve Outcome: Progressing Goal: Ability to manage health-related needs will improve Outcome: Progressing   Problem: Metabolic: Goal: Ability to maintain appropriate glucose levels will improve Outcome: Progressing   Problem: Nutritional: Goal: Maintenance of adequate nutrition will improve Outcome: Progressing Goal: Progress toward achieving an optimal weight will improve Outcome: Progressing   Problem: Skin Integrity: Goal: Risk for impaired skin integrity will decrease Outcome: Progressing   Problem: Tissue Perfusion: Goal: Adequacy of tissue perfusion will improve Outcome: Progressing   Problem: Education: Goal: Knowledge of risk factors and measures for prevention of condition will improve Outcome: Progressing   Problem: Coping: Goal: Psychosocial and spiritual needs will be supported Outcome: Progressing   Problem: Respiratory: Goal: Will maintain a patent airway Outcome: Progressing Goal: Complications related to the disease process, condition or treatment will be avoided or minimized Outcome: Progressing

## 2019-08-09 NOTE — Clinical Social Work Note (Signed)
Patient is being followed by [facility] for home health.  [name]. [name], MSW, LCSW [phone]  08/09/2019 9:32 AM

## 2019-08-09 NOTE — Progress Notes (Signed)
Spoke with daughter about her concerns for mothers falls and bowel issues that may be leading to urgency and falls.  Informed MD of daughters concerns.

## 2019-08-09 NOTE — Progress Notes (Signed)
Lovenox per Pharmacy for DVT Prophylaxis    Pharmacy has been consulted from dosing enoxaparin (lovenox) in this patient for DVT prophylaxis.  The pharmacist has reviewed pertinent labs (Hgb _9.6__; PLT__311_), patient weight (_87__kg) and renal function (CrCl__32_mL/min) and decided that enoxaparin 40__mg SQ Q24Hrs is appropriate for this patient.  The pharmacy department will sign off at this time.  Please reconsult pharmacy if status changes or for further issues.  Thank you  Cyndia Diver PharmD, BCPS  08/09/2019, 5:57 AM

## 2019-08-10 ENCOUNTER — Inpatient Hospital Stay (HOSPITAL_COMMUNITY): Payer: Medicare HMO

## 2019-08-10 DIAGNOSIS — K760 Fatty (change of) liver, not elsewhere classified: Secondary | ICD-10-CM | POA: Diagnosis not present

## 2019-08-10 DIAGNOSIS — U071 COVID-19: Secondary | ICD-10-CM | POA: Diagnosis not present

## 2019-08-10 DIAGNOSIS — N179 Acute kidney failure, unspecified: Secondary | ICD-10-CM | POA: Diagnosis not present

## 2019-08-10 DIAGNOSIS — S79922A Unspecified injury of left thigh, initial encounter: Secondary | ICD-10-CM | POA: Diagnosis not present

## 2019-08-10 DIAGNOSIS — S79921A Unspecified injury of right thigh, initial encounter: Secondary | ICD-10-CM | POA: Diagnosis not present

## 2019-08-10 DIAGNOSIS — M79651 Pain in right thigh: Secondary | ICD-10-CM | POA: Diagnosis not present

## 2019-08-10 DIAGNOSIS — M79652 Pain in left thigh: Secondary | ICD-10-CM | POA: Diagnosis not present

## 2019-08-10 DIAGNOSIS — R519 Headache, unspecified: Secondary | ICD-10-CM | POA: Diagnosis not present

## 2019-08-10 DIAGNOSIS — N189 Chronic kidney disease, unspecified: Secondary | ICD-10-CM | POA: Diagnosis not present

## 2019-08-10 DIAGNOSIS — G894 Chronic pain syndrome: Secondary | ICD-10-CM | POA: Diagnosis not present

## 2019-08-10 DIAGNOSIS — R197 Diarrhea, unspecified: Secondary | ICD-10-CM | POA: Diagnosis not present

## 2019-08-10 DIAGNOSIS — R1030 Lower abdominal pain, unspecified: Secondary | ICD-10-CM | POA: Diagnosis not present

## 2019-08-10 DIAGNOSIS — E1121 Type 2 diabetes mellitus with diabetic nephropathy: Secondary | ICD-10-CM | POA: Diagnosis not present

## 2019-08-10 LAB — C-REACTIVE PROTEIN: CRP: 2.8 mg/dL — ABNORMAL HIGH (ref <1.0)

## 2019-08-10 LAB — COMPREHENSIVE METABOLIC PANEL
ALT: 88 U/L — ABNORMAL HIGH (ref 0–44)
AST: 167 U/L — ABNORMAL HIGH (ref 15–41)
Albumin: 1.8 g/dL — ABNORMAL LOW (ref 3.5–5.0)
Alkaline Phosphatase: 162 U/L — ABNORMAL HIGH (ref 38–126)
Anion gap: 9 (ref 5–15)
BUN: 15 mg/dL (ref 8–23)
CO2: 19 mmol/L — ABNORMAL LOW (ref 22–32)
Calcium: 7.3 mg/dL — ABNORMAL LOW (ref 8.9–10.3)
Chloride: 114 mmol/L — ABNORMAL HIGH (ref 98–111)
Creatinine, Ser: 1.62 mg/dL — ABNORMAL HIGH (ref 0.44–1.00)
GFR calc Af Amer: 38 mL/min — ABNORMAL LOW (ref >60)
GFR calc non Af Amer: 33 mL/min — ABNORMAL LOW (ref >60)
Glucose, Bld: 222 mg/dL — ABNORMAL HIGH (ref 70–99)
Potassium: 3.5 mmol/L (ref 3.5–5.1)
Sodium: 142 mmol/L (ref 135–145)
Total Bilirubin: 1 mg/dL (ref 0.3–1.2)
Total Protein: 5 g/dL — ABNORMAL LOW (ref 6.5–8.1)

## 2019-08-10 LAB — CBC
HCT: 29.6 % — ABNORMAL LOW (ref 36.0–46.0)
Hemoglobin: 10.6 g/dL — ABNORMAL LOW (ref 12.0–15.0)
MCH: 28.9 pg (ref 26.0–34.0)
MCHC: 35.8 g/dL (ref 30.0–36.0)
MCV: 80.7 fL (ref 80.0–100.0)
Platelets: 322 10*3/uL (ref 150–400)
RBC: 3.67 MIL/uL — ABNORMAL LOW (ref 3.87–5.11)
RDW: 17.2 % — ABNORMAL HIGH (ref 11.5–15.5)
WBC: 8.8 10*3/uL (ref 4.0–10.5)
nRBC: 0 % (ref 0.0–0.2)

## 2019-08-10 LAB — PHOSPHORUS: Phosphorus: 2.7 mg/dL (ref 2.5–4.6)

## 2019-08-10 LAB — GLUCOSE, CAPILLARY
Glucose-Capillary: 115 mg/dL — ABNORMAL HIGH (ref 70–99)
Glucose-Capillary: 326 mg/dL — ABNORMAL HIGH (ref 70–99)
Glucose-Capillary: 223 mg/dL — ABNORMAL HIGH (ref 70–99)

## 2019-08-10 LAB — SEDIMENTATION RATE: Sed Rate: 7 mm/hr (ref 0–22)

## 2019-08-10 LAB — MAGNESIUM: Magnesium: 1.5 mg/dL — ABNORMAL LOW (ref 1.7–2.4)

## 2019-08-10 MED ORDER — ROSUVASTATIN CALCIUM 20 MG PO TABS
20.0000 mg | ORAL_TABLET | Freq: Every day | ORAL | Status: DC
  Administered 2019-08-10 – 2019-08-13 (×4): 20 mg via ORAL
  Filled 2019-08-10 (×4): qty 1

## 2019-08-10 MED ORDER — ALBUMIN HUMAN 25 % IV SOLN
25.0000 g | Freq: Once | INTRAVENOUS | Status: AC
  Administered 2019-08-10: 25 g via INTRAVENOUS
  Filled 2019-08-10: qty 100

## 2019-08-10 MED ORDER — TRAMADOL HCL 50 MG PO TABS
100.0000 mg | ORAL_TABLET | Freq: Two times a day (BID) | ORAL | Status: DC | PRN
  Administered 2019-08-10 – 2019-08-14 (×5): 100 mg via ORAL
  Filled 2019-08-10 (×5): qty 2

## 2019-08-10 MED ORDER — MAGNESIUM SULFATE 2 GM/50ML IV SOLN
2.0000 g | Freq: Once | INTRAVENOUS | Status: AC
  Administered 2019-08-10: 2 g via INTRAVENOUS
  Filled 2019-08-10: qty 50

## 2019-08-10 MED ORDER — LACTATED RINGERS IV SOLN: INTRAVENOUS | Status: DC

## 2019-08-10 MED ORDER — GABAPENTIN 400 MG PO CAPS
800.0000 mg | ORAL_CAPSULE | Freq: Three times a day (TID) | ORAL | Status: DC
  Administered 2019-08-10 – 2019-08-11 (×2): 800 mg via ORAL
  Filled 2019-08-10 (×3): qty 2

## 2019-08-10 MED ORDER — METOPROLOL TARTRATE 25 MG PO TABS
25.0000 mg | ORAL_TABLET | Freq: Two times a day (BID) | ORAL | Status: DC
  Administered 2019-08-10: 25 mg via ORAL
  Filled 2019-08-10: qty 1

## 2019-08-10 MED ORDER — SODIUM CHLORIDE 0.9 % IV BOLUS
500.0000 mL | Freq: Once | INTRAVENOUS | Status: AC
  Administered 2019-08-10: 500 mL via INTRAVENOUS

## 2019-08-10 MED ORDER — SODIUM CHLORIDE 0.9 % IV SOLN
1.0000 g | Freq: Three times a day (TID) | INTRAVENOUS | Status: DC
  Administered 2019-08-10: 1 g via INTRAVENOUS
  Filled 2019-08-10: qty 1

## 2019-08-10 MED ORDER — NYSTATIN 100000 UNIT/GM EX CREA
TOPICAL_CREAM | Freq: Two times a day (BID) | CUTANEOUS | Status: DC
  Administered 2019-08-11: 1 via TOPICAL
  Filled 2019-08-10: qty 15

## 2019-08-10 MED ORDER — SODIUM BICARBONATE 650 MG PO TABS
650.0000 mg | ORAL_TABLET | Freq: Three times a day (TID) | ORAL | Status: DC
  Administered 2019-08-10 – 2019-08-14 (×12): 650 mg via ORAL
  Filled 2019-08-10 (×12): qty 1

## 2019-08-10 MED ORDER — SODIUM CHLORIDE 0.9 % IV SOLN
2.0000 g | Freq: Two times a day (BID) | INTRAVENOUS | Status: DC
  Administered 2019-08-10 – 2019-08-12 (×4): 2 g via INTRAVENOUS
  Filled 2019-08-10 (×5): qty 2

## 2019-08-10 NOTE — Progress Notes (Signed)
PT Cancellation Note  Patient Details Name: [name] MRN: [id number] DOB: 20-Oct-1950   Cancelled Treatment:    Reason Eval/Treat Not Completed: Medical issues which prohibited therapy--MD/RN recommended PT be held today.    Doreatha Massed, PT Acute Rehabilitation

## 2019-08-10 NOTE — Progress Notes (Signed)
MEWS score of 6 reported to me @ 1620. Patient found with rapid breathing with respirations of 22, no use of accessory muscles. Heart rate elevated in the 120-130s. Temp is 102.5. Assessed patient while [place] notified rapid response team. Refer to flowsheet.

## 2019-08-10 NOTE — Progress Notes (Signed)
PROGRESS NOTE    [name]  NFA:[id number] DOB: 10-19-1950 DOA: 08/08/2019 PCP: Ann Held, DO    Brief Narrative:  [name] is a 69 y.o. female with medical history significant of .  Diabetes type 2, hyperlipidemia, asthma not on home oxygen, recent hospitalization for Covid infection presents to the emergency department chief complaint generalized weakness/fall/ loose stool loss of appetite and nausea.  She was discharged from the hospital January 4 feeling better during her time at home she has had no appetite she tries to eat and then feels nauseated.  She denies any nausea between meals.  He denies any vomiting.  She denies any abdominal pain bloating constipation.  She reports 1 mechanical fall without loss of consciousness or injury yesterday.  She reports feeling very weak not having eaten for the last 2 days.  She denies headache dizziness syncope or near syncope.  She denies chest pain palpitations shortness of breath fever chills.  She denies any dysuria hematuria frequency or urgency.   Please see H&P for full details  Assessment & Plan:   Active Problems:   Nausea   Type 2 diabetes with nephropathy (HCC)   Chronic pain syndrome   Diarrhea   Acute kidney injury superimposed on chronic kidney disease (HCC)   Weakness   CAD (coronary artery disease)   Fall   Hypertension  Clinical problems list 1.  Nausea/diarrhea 2.  Generalized weakness/COVID-19 virus infection 3.  Type 2 diabetes mellitus 4.  Acute on chronic kidney injury 5.  Essential hypertension 6.  Chronic pain syndrome 7.  Coronary artery disease 8.  History of DVT  9.  SIRS 10.  Abnormal CT abdomen pelvis   1.  Nausea/diarrhea.  Controlled with Zofran Patient tolerating p.o. intake.  No more diarrhea Continue gentle IV hydration Discontinue C. difficile assay  2.  Generalized weakness, COVID-19 virus infection. Patient initially placed on 2 L O2. Currently saturating greater than 90%  on room air. Continue remdesivir and dexamethasone Supportive therapy with zinc, vitamin C&D.  3.  Diabetes mellitus type 2 Continue with sliding scale insulin Continue with basal insulin Fingersticks before meals and at bedtime Hypoglycemic protocol  4.  Acute on chronic kidney disease.  Baseline creatinine 1.4-1.5 Serum creatinine on presentation was 2.34.  Likely secondary to dehydration from diarrhea. Monitor renal function and avoid nephrotoxic agents  5.  Essential hypertension.  Currently normotensive Continue with amlodipine  6.  Chronic pain syndrome Continue with gabapentin and tramadol  7.  Coronary artery disease.  Stable Continue with Plavix and statin  8.  History of DVT Continue with apixaban  9.  SIRS.  Patient had fever of 102, with tachycardia. Rapid response was called on patient was started on IV fluid and antipyretic with resolution of fever. Obtain blood culture x2 Start empiric Azactam, as patient is allergic to penicillin. Obtain inflammatory markers  10.  Abnormal CT scan of abdomen and pelvis/distended gallbladder/severe hepatic steatosis Consulted GI and recommended obtaining abdominal x-ray, abdominal ultrasound and continue with IV fluid.   DVT prophylaxis: Apixaban 5 mg twice daily  Code Status: Full code  Family Communication: None at bedside  Disposition Plan: Patient is from home Patient will be discharged home and no barriers to discharge. Expected date of discharge 08/11/2019  Consultants:   None  Procedures: None  Antimicrobials: Azithromycin Ceftriaxone Reevaluate antibiotics and de-escalate with blood culture report.    Subjective: Patient was seen and examined at bedside.  Patient is tachycardic  and had fever of 102.  Shivering.  Alert and oriented x3.  On 2 L O2 by nasal cannula.  Patient admitted for generalized weakness.  Will get PT consult.  Objective: Vitals:   08/10/19 1504 08/10/19 1519 08/10/19 1550 08/10/19  1700  BP: (!) 82/55 (!) 92/47 100/79 (!) 94/55  Pulse: (!) 115 (!) 104 (!) 110 (!) 107  Resp: 18 20 18 18   Temp: (!) 101.3 F (38.5 C) (!) 101.1 F (38.4 C) (!) 100.7 F (38.2 C) 98.3 F (36.8 C)  TempSrc: Oral Oral Oral Oral  SpO2: 98% 96% 99% 95%  Weight:      Height:        Intake/Output Summary (Last 24 hours) at 08/10/2019 1721 Last data filed at 08/10/2019 0600 Gross per 24 hour  Intake 1269.9 ml  Output 650 ml  Net 619.9 ml   Filed Weights   08/08/19 1709  Weight: 87.1 kg    Examination:  General exam: Appears calm and comfortable  Respiratory system: Clear to auscultation. Respiratory effort normal. Cardiovascular system: S1 & S2 heard, RRR. No JVD, murmurs, rubs, gallops or clicks. No pedal edema. Gastrointestinal system: Abdomen is nondistended, soft and nontender. No organomegaly or masses felt. Normal bowel sounds heard. Central nervous system: Alert and oriented. No focal neurological deficits. Extremities: Bilateral lower extremity weakness. Skin: No rashes, lesions or ulcers Psychiatry: Judgement and insight appear normal. Mood & affect appropriate.     Data Reviewed: I have personally reviewed following labs and imaging studies  CBC: Recent Labs  Lab 08/08/19 1323 08/08/19 1655 08/09/19 0108 08/10/19 1130  WBC 14.4* 15.3* 10.9* 8.8  HGB 11.6* 10.6* 9.6* 10.6*  HCT 33.8* 31.1* 27.5* 29.6*  MCV 84.1 84.1 82.3 80.7  PLT 416* 361 311 245   Basic Metabolic Panel: Recent Labs  Lab 08/08/19 1400 08/08/19 1655 08/09/19 0108 08/10/19 1130  NA 141  --  141 142  K 4.5  --  3.2* 3.5  CL 109  --  111 114*  CO2 20*  --  20* 19*  GLUCOSE 139*  --  153* 222*  BUN 24*  --  22 15  CREATININE 2.34* 2.18* 1.91* 1.62*  CALCIUM 7.6*  --  7.0* 7.3*  MG  --  1.7  --  1.5*  PHOS  --   --   --  2.7   GFR: Estimated Creatinine Clearance: 37.5 mL/min (A) (by C-G formula based on SCr of 1.62 mg/dL (H)). Liver Function Tests: Recent Labs  Lab  08/08/19 1400 08/10/19 1130  AST 176* 167*  ALT 68* 88*  ALKPHOS 122 162*  BILITOT 1.2 1.0  PROT 5.3* 5.0*  ALBUMIN 2.0* 1.8*   Recent Labs  Lab 08/08/19 1400  LIPASE 11   No results for input(s): AMMONIA in the last 168 hours. Coagulation Profile: No results for input(s): INR, PROTIME in the last 168 hours. Cardiac Enzymes: No results for input(s): CKTOTAL, CKMB, CKMBINDEX, TROPONINI in the last 168 hours. BNP (last 3 results) No results for input(s): PROBNP in the last 8760 hours. HbA1C: No results for input(s): HGBA1C in the last 72 hours. CBG: Recent Labs  Lab 08/09/19 0804 08/09/19 1146 08/09/19 1713 08/09/19 2115 08/10/19 0821  GLUCAP 107* 165* 193* 147* 115*   Lipid Profile: No results for input(s): CHOL, HDL, LDLCALC, TRIG, CHOLHDL, LDLDIRECT in the last 72 hours. Thyroid Function Tests: No results for input(s): TSH, T4TOTAL, FREET4, T3FREE, THYROIDAB in the last 72 hours. Anemia Panel: No results for input(s):  VITAMINB12, FOLATE, FERRITIN, TIBC, IRON, RETICCTPCT in the last 72 hours. Sepsis Labs: Recent Labs  Lab 08/08/19 1341 08/08/19 1655  LATICACIDVEN 2.8* 1.5    No results found for this or any previous visit (from the past 240 hour(s)).       Radiology Studies: No results found.      Scheduled Meds: . apixaban  5 mg Oral BID  . Chlorhexidine Gluconate Cloth  6 each Topical Daily  . clopidogrel  75 mg Oral Daily  . famotidine  20 mg Oral Daily  . gabapentin  600 mg Oral BID  . insulin aspart  0-5 Units Subcutaneous QHS  . insulin aspart  0-9 Units Subcutaneous TID WC  . lidocaine-prilocaine   Topical Once  . rosuvastatin  20 mg Oral q1800  . sodium bicarbonate  650 mg Oral TID  . sodium chloride flush  10-40 mL Intracatheter Q12H   Continuous Infusions: . aztreonam    . lactated ringers    . magnesium sulfate bolus IVPB       LOS: 1 day    Time spent: [place], MD [facility] Pager [phone]    If 7PM-7AM, please contact night-coverage [email or web address] Password [facility] 08/10/2019, 5:21 PM

## 2019-08-10 NOTE — Significant Event (Signed)
Rapid Response Event Note  Overview: Time Called: 0092 Arrival Time: 3300  Notified by [facility] Charge RN in regards to patient's MEWS score being Red (6). Patient alert and oriented upon arrival. MEWS score elevated in regards to temperature 102.5 oral, RR mid 76A, BP systolic of 26J. Patient complaining of her navel burning and hurting. There was circumferential redness around the navel.   Initial Focused Assessment: Neuro: Alert and oriented x4 Cardiac: HR-ST 110s-120s, BP hypotensive see vital signs Pulmonary: Clear and diminished breath sounds in all lung fields, more diminished in lower bases. RR mid 20s. Patient in no apparent respiratory distress.   Interventions: Tylenol given at 1428 665m 500 cc bolus started at 5060mhr  Many heavy blankets removed Ice packs placed  Encourage patient to drink water MD notified that patient would like MD to assess patient in regards to navel. MD on the way to bedside.   Plan of Care (if not transferred): Temperature, BP, and RR improved with interventions. MEWs decreased from 6 to 3. Continue to monitor patient on [facility]. If patient's MEWS increase or has a clinical status change, please call rapid response at [phone]   Abigael Mogle C

## 2019-08-10 NOTE — Progress Notes (Signed)
PHARMACY NOTE:  ANTIMICROBIAL RENAL DOSAGE ADJUSTMENT  Current antimicrobial regimen includes a mismatch between antimicrobial dosage and estimated renal function.  As per policy approved by the Pharmacy & Therapeutics and Medical Executive Committees, the antimicrobial dosage will be adjusted accordingly.  Current antimicrobial dosage:  Cefepime 2gm IV q8h  Indication: SIRS/fever  Renal Function:  Estimated Creatinine Clearance: 37.5 mL/min (A) (by C-G formula based on SCr of 1.62 mg/dL (H)). []      On intermittent HD, scheduled: []      On CRRT    Antimicrobial dosage has been changed to:  Cefepime 2gm IV q12h  Additional comments:   Thank you for allowing pharmacy to be a part of this patient's care.  Netta Cedars, [facility] 08/10/2019 6:41 PM

## 2019-08-11 ENCOUNTER — Other Ambulatory Visit: Payer: Self-pay

## 2019-08-11 LAB — GLUCOSE, CAPILLARY
Glucose-Capillary: 166 mg/dL — ABNORMAL HIGH (ref 70–99)
Glucose-Capillary: 195 mg/dL — ABNORMAL HIGH (ref 70–99)
Glucose-Capillary: 144 mg/dL — ABNORMAL HIGH (ref 70–99)
Glucose-Capillary: 150 mg/dL — ABNORMAL HIGH (ref 70–99)

## 2019-08-11 LAB — COMPREHENSIVE METABOLIC PANEL
ALT: 79 U/L — ABNORMAL HIGH (ref 0–44)
AST: 97 U/L — ABNORMAL HIGH (ref 15–41)
Albumin: 1.7 g/dL — ABNORMAL LOW (ref 3.5–5.0)
Alkaline Phosphatase: 134 U/L — ABNORMAL HIGH (ref 38–126)
Anion gap: 8 (ref 5–15)
BUN: 14 mg/dL (ref 8–23)
CO2: 21 mmol/L — ABNORMAL LOW (ref 22–32)
Calcium: 7.4 mg/dL — ABNORMAL LOW (ref 8.9–10.3)
Chloride: 114 mmol/L — ABNORMAL HIGH (ref 98–111)
Creatinine, Ser: 1.55 mg/dL — ABNORMAL HIGH (ref 0.44–1.00)
GFR calc Af Amer: 40 mL/min — ABNORMAL LOW (ref >60)
GFR calc non Af Amer: 34 mL/min — ABNORMAL LOW (ref >60)
Glucose, Bld: 196 mg/dL — ABNORMAL HIGH (ref 70–99)
Potassium: 3.4 mmol/L — ABNORMAL LOW (ref 3.5–5.1)
Sodium: 143 mmol/L (ref 135–145)
Total Bilirubin: 0.8 mg/dL (ref 0.3–1.2)
Total Protein: 4.4 g/dL — ABNORMAL LOW (ref 6.5–8.1)

## 2019-08-11 LAB — CBC
HCT: 26.8 % — ABNORMAL LOW (ref 36.0–46.0)
Hemoglobin: 9.7 g/dL — ABNORMAL LOW (ref 12.0–15.0)
MCH: 29.1 pg (ref 26.0–34.0)
MCHC: 36.2 g/dL — ABNORMAL HIGH (ref 30.0–36.0)
MCV: 80.5 fL (ref 80.0–100.0)
Platelets: 278 10*3/uL (ref 150–400)
RBC: 3.33 MIL/uL — ABNORMAL LOW (ref 3.87–5.11)
RDW: 16.9 % — ABNORMAL HIGH (ref 11.5–15.5)
WBC: 10.7 10*3/uL — ABNORMAL HIGH (ref 4.0–10.5)
nRBC: 0 % (ref 0.0–0.2)

## 2019-08-11 LAB — SEDIMENTATION RATE: Sed Rate: 2 mm/hr (ref 0–22)

## 2019-08-11 LAB — C-REACTIVE PROTEIN: CRP: 4.1 mg/dL — ABNORMAL HIGH (ref <1.0)

## 2019-08-11 MED ORDER — ALBUMIN HUMAN 25 % IV SOLN
25.0000 g | INTRAVENOUS | Status: AC
  Administered 2019-08-11 (×2): 25 g via INTRAVENOUS
  Filled 2019-08-11 (×2): qty 100

## 2019-08-11 MED ORDER — POTASSIUM CHLORIDE 20 MEQ PO PACK
40.0000 meq | PACK | Freq: Once | ORAL | Status: AC
  Administered 2019-08-11: 40 meq via ORAL
  Filled 2019-08-11: qty 2

## 2019-08-11 MED ORDER — VANCOMYCIN HCL 1750 MG/350ML IV SOLN
1750.0000 mg | INTRAVENOUS | Status: DC
  Administered 2019-08-11 – 2019-08-12 (×2): 1750 mg via INTRAVENOUS
  Filled 2019-08-11 (×2): qty 350

## 2019-08-11 MED ORDER — MIDODRINE HCL 5 MG PO TABS
10.0000 mg | ORAL_TABLET | Freq: Three times a day (TID) | ORAL | Status: AC
  Administered 2019-08-11: 5 mg via ORAL
  Administered 2019-08-12 – 2019-08-13 (×4): 10 mg via ORAL
  Filled 2019-08-11 (×5): qty 2

## 2019-08-11 MED ORDER — LACTATED RINGERS IV BOLUS
500.0000 mL | Freq: Once | INTRAVENOUS | Status: AC
  Administered 2019-08-11: 500 mL via INTRAVENOUS

## 2019-08-11 MED ORDER — MIDODRINE HCL 5 MG PO TABS
5.0000 mg | ORAL_TABLET | Freq: Three times a day (TID) | ORAL | Status: DC
  Administered 2019-08-11: 5 mg via ORAL
  Filled 2019-08-11: qty 1

## 2019-08-11 MED ORDER — VANCOMYCIN HCL 1500 MG/300ML IV SOLN: 1500.0000 mg | INTRAVENOUS | Status: DC

## 2019-08-11 NOTE — Progress Notes (Signed)
PROGRESS NOTE    [name]  FUX:[id number] DOB: [birth year] DOA: 08/08/2019 PCP: Ann Held, DO    Brief Narrative:  [name] is a 69 y.o. female with medical history significant of .  Diabetes type 2, hyperlipidemia, asthma not on home oxygen, recent hospitalization for Covid infection presents to the emergency department chief complaint generalized weakness/fall/ loose stool loss of appetite and nausea.  She was discharged from the hospital January 4 feeling better during her time at home she has had no appetite she tries to eat and then feels nauseated.  She denies any nausea between meals.  He denies any vomiting.  She denies any abdominal pain bloating constipation.  She reports 1 mechanical fall without loss of consciousness or injury yesterday.  She reports feeling very weak not having eaten for the last 2 days.  She denies headache dizziness syncope or near syncope.  She denies chest pain palpitations shortness of breath fever chills.  She denies any dysuria hematuria frequency or urgency.   Please see H&P for full details  Assessment & Plan:   Active Problems:   Nausea   Type 2 diabetes with nephropathy (HCC)   Chronic pain syndrome   Diarrhea   Acute kidney injury superimposed on chronic kidney disease (HCC)   Weakness   CAD (coronary artery disease)   Fall   Hypertension  Clinical problems list 1.  Nausea/diarrhea 2.  Generalized weakness/COVID-19 virus infection 3.  Type 2 diabetes mellitus 4.  Acute on chronic kidney injury 5.  Essential hypertension 6.  Chronic pain syndrome 7.  Coronary artery disease 8.  History of DVT  9.  SIRS/hypotension 10.  Abnormal CT abdomen pelvis 11.  History of fall    1.  Nausea/diarrhea.  Controlled with Zofran Patient tolerating p.o. intake.  No more diarrhea Continue IVF hydration Discontinue C. difficile assay  2.  Generalized weakness, COVID-19 virus infection. Patient initially placed on 2 L O2. Currently  saturating greater than 90% on room air. Continue remdesivir and dexamethasone Supportive therapy with zinc, vitamin C&D.  3.  Diabetes mellitus type 2 Continue with sliding scale insulin Continue with basal insulin Fingersticks before meals and at bedtime Hypoglycemic protocol  4.  Acute on chronic kidney disease.  Baseline creatinine 1.4-1.5 Serum creatinine on presentation was 2.34.  Likely secondary to dehydration from diarrhea. Monitor renal function and avoid nephrotoxic agents  5.  Essential hypertension.  Currently normotensive Continue with amlodipine  6.  Chronic pain syndrome Continue with gabapentin and tramadol  7.  Coronary artery disease.  Stable Continue with Plavix and statin  8.  History of DVT Continue with apixaban  9.  Sepsis/SIRS with hypotension.  Patient had fever of 102, with tachycardia. Rapid response was called on patient was started on IV fluid and antipyretic with resolution of fever. Obtain blood culture x2: Blood culture has shown no growth to date Started empiric cefepime 2 g every 8 hourly. Obtain inflammatory markers: CRP appears elevated from 2.8-4.1 Patient has been hypotensive with systolic blood pressure running in the 70s.  Initiated bolus fluid, Albumin and midodrine. Will add empiric vancomycin to give a broader coverage Follow-up blood culture report and escalate/de-escalate antibiotics as necessary.  10.  Abnormal CT scan of abdomen and pelvis/distended gallbladder/severe hepatic steatosis Consulted GI and recommended obtaining abdominal x-ray, abdominal ultrasound and continue with IV fluid. Abdominal x-ray showed mild mid colonic distention concerning for ileus versus early obstruction.  Patient has been tolerating p.o. without any  nausea vomiting. Please see GI consult note for full details.  11.  History of fall.  Patient reported to have fallen x3 at home CT of the head without contrast was negative for any acute intracranial  process. X-ray of bilateral femur was negative for any acute intraosseous process except for right knee degenerative changes and left hip arthroplasty.    DVT prophylaxis: Apixaban 5 mg twice daily  Code Status: Full code  Family Communication: Spoke with patient's daughter to inform her of reports of imaging   Disposition Plan: Patient is from home Patient will be discharged home and no barriers to discharge. Expected date of discharge 08/13/2019  Consultants:   None  Procedures: None  Antimicrobials: Cefepime 08/10/2019 Vancomycin was added 08/11/2019.    Subjective: Patient was seen and examined at bedside.   Patient requested for a new provider today 08/11/2019.  Discussed with patient relations and plan is for patient to have a new provider tomorrow 08/12/2019 Objective: Vitals:   08/11/19 1357 08/11/19 1557 08/11/19 1630 08/11/19 1650  BP: (!) 72/62 (!) 76/47 (!) 103/59 101/80  Pulse: 88 86 84 86  Resp: 18 19 19 19   Temp: 99.7 F (37.6 C) 99.3 F (37.4 C)    TempSrc: Oral Oral    SpO2: 96% 100% 98% 95%  Weight:      Height:        Intake/Output Summary (Last 24 hours) at 08/11/2019 1700 Last data filed at 08/11/2019 1400 Gross per 24 hour  Intake 2924.96 ml  Output 3350 ml  Net -425.04 ml   Filed Weights   08/08/19 1709  Weight: 87.1 kg    Examination:  General exam: Appears calm and comfortable  Respiratory system: Clear to auscultation. Respiratory effort normal. Cardiovascular system: S1 & S2 heard, RRR. No JVD, murmurs, rubs, gallops or clicks. No pedal edema. Gastrointestinal system: Abdomen is nondistended, soft and nontender. No organomegaly or masses felt. Normal bowel sounds heard. Central nervous system: Alert and oriented. No focal neurological deficits. Extremities: Bilateral lower extremity weakness. Skin: No rashes, lesions or ulcers Psychiatry: Judgement and insight appear normal. Mood & affect appropriate.     Data Reviewed: I have  personally reviewed following labs and imaging studies  CBC: Recent Labs  Lab 08/08/19 1323 08/08/19 1655 08/09/19 0108 08/10/19 1130 08/11/19 0500  WBC 14.4* 15.3* 10.9* 8.8 10.7*  HGB 11.6* 10.6* 9.6* 10.6* 9.7*  HCT 33.8* 31.1* 27.5* 29.6* 26.8*  MCV 84.1 84.1 82.3 80.7 80.5  PLT 416* 361 311 322 765   Basic Metabolic Panel: Recent Labs  Lab 08/08/19 1400 08/08/19 1655 08/09/19 0108 08/10/19 1130 08/11/19 0500  NA 141  --  141 142 143  K 4.5  --  3.2* 3.5 3.4*  CL 109  --  111 114* 114*  CO2 20*  --  20* 19* 21*  GLUCOSE 139*  --  153* 222* 196*  BUN 24*  --  22 15 14   CREATININE 2.34* 2.18* 1.91* 1.62* 1.55*  CALCIUM 7.6*  --  7.0* 7.3* 7.4*  MG  --  1.7  --  1.5*  --   PHOS  --   --   --  2.7  --    GFR: Estimated Creatinine Clearance: 39.1 mL/min (A) (by C-G formula based on SCr of 1.55 mg/dL (H)). Liver Function Tests: Recent Labs  Lab 08/08/19 1400 08/10/19 1130 08/11/19 0500  AST 176* 167* 97*  ALT 68* 88* 79*  ALKPHOS 122 162* 134*  BILITOT 1.2 1.0  0.8  PROT 5.3* 5.0* 4.4*  ALBUMIN 2.0* 1.8* 1.7*   Recent Labs  Lab 08/08/19 1400  LIPASE 11   No results for input(s): AMMONIA in the last 168 hours. Coagulation Profile: No results for input(s): INR, PROTIME in the last 168 hours. Cardiac Enzymes: No results for input(s): CKTOTAL, CKMB, CKMBINDEX, TROPONINI in the last 168 hours. BNP (last 3 results) No results for input(s): PROBNP in the last 8760 hours. HbA1C: No results for input(s): HGBA1C in the last 72 hours. CBG: Recent Labs  Lab 08/10/19 2015 08/10/19 2226 08/11/19 0725 08/11/19 1147 08/11/19 1648  GLUCAP 326* 223* 166* 195* 144*   Lipid Profile: No results for input(s): CHOL, HDL, LDLCALC, TRIG, CHOLHDL, LDLDIRECT in the last 72 hours. Thyroid Function Tests: No results for input(s): TSH, T4TOTAL, FREET4, T3FREE, THYROIDAB in the last 72 hours. Anemia Panel: No results for input(s): VITAMINB12, FOLATE, FERRITIN, TIBC, IRON,  RETICCTPCT in the last 72 hours. Sepsis Labs: Recent Labs  Lab 08/08/19 1341 08/08/19 1655  LATICACIDVEN 2.8* 1.5    Recent Results (from the past 240 hour(s))  Culture, blood (routine x 2)     Status: None (Preliminary result)   Collection Time: 08/10/19  4:41 PM   Specimen: BLOOD  Result Value Ref Range Status   Specimen Description   Final    BLOOD RIGHT ANTECUBITAL Performed at [facility] [address] [place], [place] [address]    Special Requests   Final    BOTTLES DRAWN AEROBIC AND ANAEROBIC Blood Culture adequate volume Performed at [facility] [address], [place], [place] [address]    Culture   Final    NO GROWTH < 12 HOURS Performed at [facility] [address], [place], [place] [address]    Report Status PENDING  Incomplete  Culture, blood (routine x 2)     Status: None (Preliminary result)   Collection Time: 08/10/19  4:41 PM   Specimen: BLOOD  Result Value Ref Range Status   Specimen Description   Final    BLOOD RIGHT WRIST Performed at [facility] [address] [place], [place] [address]    Special Requests   Final    BOTTLES DRAWN AEROBIC ONLY Blood Culture adequate volume Performed at [facility] [address] [place], [place] [address]    Culture   Final    NO GROWTH < 12 HOURS Performed at [facility] [address] [place],  [address]    Report Status PENDING  Incomplete         Radiology Studies: CT HEAD WO CONTRAST  Result Date: 08/10/2019 CLINICAL DATA:  Headache.  Suspicion of intracranial hemorrhage. EXAM: CT HEAD WITHOUT CONTRAST TECHNIQUE: Contiguous axial images were obtained from the base of the skull through the vertex without intravenous contrast. COMPARISON:  03/19/2018 FINDINGS: Brain: Mild age related atrophy. No evidence of old or acute infarction, mass lesion, hemorrhage, hydrocephalus or extra-axial collection.  Vascular: There is atherosclerotic calcification of the major vessels at the base of the brain. Skull: Negative Sinuses/Orbits: Clear/normal Other: None IMPRESSION: Normal study for age.  No cause of headache identified. Electronically Signed   By: Nelson Chimes M.D.   On: 08/10/2019 21:00   US Abdomen Complete  Result Date: 08/10/2019 CLINICAL DATA:  69 year old female with abdominal pain. EXAM: ABDOMEN ULTRASOUND COMPLETE COMPARISON:  CT of the abdomen pelvis dated 08/08/2019 FINDINGS: Gallbladder: No gallstones or wall thickening visualized. No sonographic  Murphy sign noted by sonographer. Common bile duct: Diameter: 3 mm Liver: There is diffuse increased liver echogenicity most commonly seen in the setting of fatty infiltration. Superimposed inflammation or fibrosis is not excluded. Clinical correlation is recommended. Portal vein is patent on color Doppler imaging with normal direction of blood flow towards the liver. IVC: No abnormality visualized. Pancreas: Not well visualized. Spleen: Size and appearance within normal limits. Right Kidney: Length: 10 cm. Normal echogenicity. No hydronephrosis or shadowing stone. Left Kidney: Length: 9 cm. Normal echogenicity. No hydronephrosis or shadowing stone. Abdominal aorta: No aneurysm visualized. Other findings: None. IMPRESSION: Fatty liver, otherwise unremarkable abdominal ultrasound. Electronically Signed   By: Anner Crete M.D.   On: 08/10/2019 22:19   DG Abd 2 Views  Result Date: 08/10/2019 CLINICAL DATA:  COVID-19 positive, acute onset lower abdominal pain. EXAM: ABDOMEN - 2 VIEW COMPARISON:  CT abdomen pelvis 08/08/2019 FINDINGS: Few contiguous loops of borderline air distended small bowel in the mid abdomen. Large volume of stool throughout the colon. Air and stool overlying the rectal vault. No suspicious calcifications. Prior right hip arthroplasty is noted. Degenerative changes noted in the lumbar spine, pelvis and left hip. IMPRESSION: Few  contiguous loops of increasingly air distended small bowel in the mid abdomen could reflect ileus or early obstruction. Electronically Signed   By: Lovena Le M.D.   On: 08/10/2019 17:55   DG FEMUR 1V LEFT  Result Date: 08/10/2019 CLINICAL DATA:  Pain status post fall EXAM: LEFT FEMUR 1 VIEW COMPARISON:  None. 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 There is no acute displaced fracture. No dislocation. Evaluation is limited by single view technique. Vascular calcifications are noted. IMPRESSION: No definite acute displaced fracture. Electronically Signed   By: Constance Holster M.D.   On: 08/10/2019 19:36   DG FEMUR 1V RIGHT  Result Date: 08/10/2019 CLINICAL DATA:  Pain status post fall. EXAM: RIGHT FEMUR 1 VIEW COMPARISON:  None. FINDINGS: The patient is status post total hip arthroplasty on the right. Vascular calcifications are noted. There is no displaced fracture. No dislocation. There are few subtle lucencies coursing through the greater trochanter. There are multicompartmental degenerative changes of the right knee. Osteopenia is noted. IMPRESSION: 1. Subtle lucencies are noted coursing through the greater trochanter. If there is clinical concern for a fracture at this level, consider dedicated right hip radiographs for further evaluation. 2. Status post total hip arthroplasty. 3. Multicompartmental degenerative changes are noted of the knee. Electronically Signed   By: Constance Holster M.D.   On: 08/10/2019 19:40        Scheduled Meds: . apixaban  5 mg Oral BID  . Chlorhexidine Gluconate Cloth  6 each Topical Daily  . clopidogrel  75 mg Oral Daily  . famotidine  20 mg Oral Daily  . gabapentin  800 mg Oral TID  . insulin aspart  0-5 Units Subcutaneous QHS  . insulin aspart  0-9 Units Subcutaneous TID WC  . lidocaine-prilocaine   Topical Once  . metoprolol tartrate  25 mg Oral BID  . midodrine  10 mg Oral TID WC  . nystatin cream   Topical BID    . rosuvastatin  20 mg Oral q1800  . sodium bicarbonate  650 mg Oral TID  . sodium chloride flush  10-40 mL Intracatheter Q12H   Continuous Infusions: . ceFEPime (MAXIPIME) IV 2 g (08/11/19 0959)  . lactated ringers 100 mL/hr at 08/10/19 1752     LOS: 2  days    Time spent: [place], MD [facility] Pager [phone]   If 7PM-7AM, please contact night-coverage [email or web address] Password [email or web address] 08/11/2019, 5:00 PM

## 2019-08-11 NOTE — Consult Note (Addendum)
[facility] Consult  Referring Provider: Leonides Grills, MD/[facility] Primary Care Physician:  Carollee Herter, Alferd Apa, DO Primary Gastroenterologist: Dr.Schooler/[facility]  Reason for Consultation:  Abdominal pain  HPI: [name] is a 69 y.o. female was admitted on 08/08/2019 with generalized weakness, recurrent falls.  History was obtained from the patient over the phone and her nurse over the phone.  Patient states that she kept falling at home and was then brought to the ED. She was recently hospitalized for Covid infection and discharged on January 4. In the ER she was found to have acute kidney injury superimposed on chronic kidney disease secondary to dehydration.  Patient states she has had pain around the navel area for 2 days which feels like a burning and is associated with a red rash with some discharge on it.  Since applying nystatin cream from yesterday evening the pain has improved. Patient denies nausea, has been able to tolerate regular diet. Patient denies diarrhea, has been having formed stools. As per her nurse the stools are without any blood in it or black stools.  Because she came in with falls at home she had x-ray of her femur, CAT scan of the head which has been unremarkable.  Patient complains of pain in other areas related to neuropathy which is improved with tramadol. Otherwise patient states she has daily bowel movements, denies diarrhea, denies acid reflux, heartburn, difficulty swallowing, pain on swallowing. Patient denies loss of appetite unintentional weight loss.  She had a colonoscopy in 2017 by Dr. Michail Sermon which showed internal hemorrhoids and sigmoid diverticulosis and was recommended to follow-up in 5 years. Upper GI series in 2017 showed partial gastrectomy changes otherwise unremarkable. EGD from 2017 showed 1 cm hiatal hernia and changes of Billroth II gastrojejunostomy, otherwise unremarkable. 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   Past Medical History:  Diagnosis Date  . Asthma   . Degenerative joint disease   . Diabetes mellitus   . Dyspnea   . GERD (gastroesophageal reflux disease)   . Hyperlipidemia    no longer on medication for this  . Hypertension    patient denies  . Myocardial infarction ([place]) 08/2018  . Neuropathy    bilateral hands and feet  . Sickle cell trait ([place])   . Sleep apnea   . stomach ca dx'd 2010   chemo/xrt comp 05/2009  . TIA (transient ischemic attack) 07/2015    Past Surgical History:  Procedure Laterality Date  . APPENDECTOMY    . CESAREAN SECTION    . COLON SURGERY     colonscopy  . COLONOSCOPY WITH PROPOFOL N/A 06/01/2016   Procedure: COLONOSCOPY WITH PROPOFOL;  Surgeon: Wilford Corner, MD;  Location: [facility];  Service: Endoscopy;  Laterality: N/A;  . CORONARY STENT INTERVENTION N/A 08/29/2018   Procedure: CORONARY STENT INTERVENTION;  Surgeon: Wellington Hampshire, MD;  Location: [place] [facility] LAB;  Service: Cardiovascular;  Laterality: N/A;  . CORONARY/GRAFT ACUTE MI REVASCULARIZATION N/A 08/27/2018   Procedure: Coronary/Graft Acute MI Revascularization;  Surgeon: Leonie Man, MD;  Location: [place] [facility];  Service: Cardiovascular;  Laterality: N/A;  . ESOPHAGOGASTRODUODENOSCOPY N/A 10/15/2012   Procedure: ESOPHAGOGASTRODUODENOSCOPY (EGD);  Surgeon: Lear Ng, MD;  Location: [facility];  Service: Endoscopy;  Laterality: N/A;  . ESOPHAGOGASTRODUODENOSCOPY N/A 01/15/2016   Procedure: ESOPHAGOGASTRODUODENOSCOPY (EGD);  Surgeon:  Ronald Lobo, MD;  Location: [facility];  Service: Endoscopy;  Laterality: N/A;  . HERNIA REPAIR    . LEFT HEART CATH AND CORONARY ANGIOGRAPHY N/A 08/27/2018   Procedure: LEFT HEART CATH AND CORONARY ANGIOGRAPHY;  Surgeon: Leonie Man, MD;  Location: [place] [facility];  Service: Cardiovascular;  Laterality: N/A;  . SHOULDER SURGERY     Left  . TOTAL HIP ARTHROPLASTY     Right  . TOTAL KNEE ARTHROPLASTY     Left    Prior to Admission medications   Medication Sig Start Date End Date Taking? Authorizing Provider  albuterol (PROVENTIL HFA;VENTOLIN HFA) 108 (90 Base) MCG/ACT inhaler Inhale 2 puffs into the lungs every 6 (six) hours as needed for wheezing or shortness of breath. 11/05/17  Yes Emokpae, Courage, MD  albuterol (PROVENTIL) (2.5 MG/3ML) 0.083% nebulizer solution Take 3 mLs (2.5 mg total) by nebulization every 6 (six) hours as needed for wheezing or shortness of breath. 11/05/17  Yes Emokpae, Courage, MD  Apixaban Starter Pack (ELIQUIS DVT/PE STARTER PACK) 5 MG TBPK Take as directed on package: start with two-16m tablets twice daily for 7 days. On day 8, switch to one-537mtablet twice daily. Patient taking differently: Take 5 mg by mouth 2 (two) times daily. Take as directed on package: start with two-67m767mablets twice daily for 7 days. On day 8, switch to one-67mg32mblet twice daily. 07/29/19  Yes PoweElodia FlorenceD  clopidogrel (PLAVIX) 75 MG tablet Take 1 tablet (75 mg total) by mouth daily. 05/13/19  Yes Black, KareLezlie Octave  darifenacin (ENABLEX) 7.5 MG 24 hr tablet Take 7.5 mg by mouth daily. 07/23/19  Yes [provider]  famotidine (PEPCID) 40 MG tablet Take 1 tablet (40 mg total) by mouth daily. 02/12/19  Yes LownRoma SchanzDO  gabapentin (NEURONTIN) 800 MG tablet TAKE ONE TABLET BY MOUTH THREE TIMES DAILY ;; Patient taking differently: Take 800 mg by mouth 3 (three) times daily.  07/01/19  Yes LownRoma SchanzDO  isosorbide mononitrate (IMDUR) 30 MG 24 hr tablet TAKE ONE HALF TABLET BY MOUTH EVERY DAY PATIENT NEEDS APPOINTMENT BEFORE NEXT REFILL. Patient taking differently: Take 15 mg by mouth daily.  07/01/19  Yes LawrLendon Colonel  lidocaine-prilocaine (EMLA) cream Apply 1 application topically as  needed. Patient taking differently: Apply 1 application topically as needed (to numb port site.).  02/28/19  Yes EnneVolanda Napoleon  metFORMIN (GLUCOPHAGE) 500 MG tablet Take 1 tablet (500 mg total) by mouth 2 (two) times daily with a meal. 02/13/19  Yes GherPhilemon Kingdom  metoprolol tartrate (LOPRESSOR) 25 MG tablet Take 1 tablet (25 mg total) by mouth 2 (two) times daily. 01/14/19  Yes BerrLorretta Harp  Multiple Vitamins-Minerals (CENTRUM SILVER ADULT 50+) TABS Take 1 tablet once a day 02/20/19  Yes LownRoma SchanzDO  nitroGLYCERIN (NITROSTAT) 0.4 MG SL tablet Place 1 tablet (0.4 mg total) under the tongue every 5 (five) minutes as needed. Patient taking differently: Place 0.4 mg under the tongue every 5 (five) minutes as needed for chest pain.  01/14/19  Yes BerrLorretta Harp  nystatin (MYCOSTATIN) 100000 UNIT/ML suspension 5 ml po swish and spit qid Patient taking differently: Use as directed 5 mLs in the mouth or throat 4 (four) times daily. Swish and spit 5 mL by mouth 4 times daily 07/03/19  Yes Lowne Chase, Yvonne R, DO  pantoprazole (PROTONIX) 40 MG tablet Take  1 tablet (40 mg total) by mouth daily. 07/16/19  Yes Roma Schanz R, DO  rosuvastatin (CRESTOR) 40 MG tablet TAKE ONE TABLET BY MOUTH EVERY DAY AT BEDTIME Patient taking differently: Take 40 mg by mouth daily.  03/01/19  Yes Leonie Man, MD  sertraline (ZOLOFT) 50 MG tablet Take 1 tablet (50 mg total) by mouth daily. 02/20/19  Yes Roma Schanz R, DO  sucralfate (CARAFATE) 1 g tablet TAKE 1 TABLET BY MOUTH TWICE DAILY Patient taking differently: Take 1 g by mouth 2 (two) times daily.  02/05/19  Yes [place], Holli Humbles, NP  traMADol (ULTRAM) 50 MG tablet TAKE TWO TABLETS BY MOUTH THREE TIMES DAILY AS NEEDED FOR MODERATE PAIN Patient taking differently: Take 100 mg by mouth 3 (three) times daily as needed for moderate pain.  04/05/19  Yes Roma Schanz R, DO  benzonatate (TESSALON) 100 MG capsule Take  1 capsule (100 mg total) by mouth 3 (three) times daily as needed for cough. Patient not taking: Reported on 08/08/2019 07/24/19   Saguier, Percell Miller, PA-C  glucose blood [employer] VERIO) test strip Use as instructed to check blood sugar 3 times a day 09/04/18   Philemon Kingdom, MD  Hyoscyamine Sulfate SL (LEVSIN/SL) 0.125 MG SUBL 1-2 SL q4h prn diarrhea Patient not taking: Reported on 07/25/2019 05/23/19   Roma Schanz R, DO  Insulin Pen Needle (B-D UF III MINI PEN NEEDLES) 31G X 5 MM MISC Use to inject insulin once a day. 01/07/19   Philemon Kingdom, MD  ipratropium-albuterol (DUONEB) 0.5-2.5 (3) MG/3ML SOLN Take 3 mLs by nebulization every 6 (six) hours as needed. Patient not taking: Reported on 07/25/2019 05/26/17   Carollee Herter, Kendrick Fries R, DO  ondansetron (ZOFRAN) 4 MG tablet TAKE ONE TABLET BY MOUTH EVERY EIGHT HOURS AS NEEDED FOR NAUSEA AND VOMITING Patient not taking: Reported on 07/25/2019 07/01/19   Volanda Napoleon, MD  traZODone (DESYREL) 50 MG tablet Take 0.5-1 tablets (25-50 mg total) by mouth at bedtime as needed for sleep. Patient not taking: Reported on 07/25/2019 05/23/19   Ann Held, DO    Current Facility-Administered Medications  Medication Dose Route Frequency Provider Last Rate Last Admin  . acetaminophen (TYLENOL) tablet 650 mg  650 mg Oral Q6H PRN Radene Gunning, NP   650 mg at 08/10/19 1428   Or  . acetaminophen (TYLENOL) suppository 650 mg  650 mg Rectal Q6H PRN Radene Gunning, NP      . apixaban (ELIQUIS) tablet 5 mg  5 mg Oral BID Elie Confer, MD   5 mg at 08/10/19 2217  . benzonatate (TESSALON) capsule 100 mg  100 mg Oral BID PRN Radene Gunning, NP      . ceFEPIme (MAXIPIME) 2 g in sodium chloride 0.9 % 100 mL IVPB  2 g Intravenous Q12H Elie Confer, MD 200 mL/hr at 08/10/19 2220 2 g at 08/10/19 2220  . Chlorhexidine Gluconate Cloth 2 % PADS 6 each  6 each Topical Daily Orma Flaming, MD   6 each at 08/10/19 [phone]  . clopidogrel (PLAVIX)  tablet 75 mg  75 mg Oral Daily Radene Gunning, NP   75 mg at 08/10/19 0953  . famotidine (PEPCID) tablet 20 mg  20 mg Oral Daily Leodis Sias T, RPH   20 mg at 08/10/19 0953  . gabapentin (NEURONTIN) capsule 800 mg  800 mg Oral TID Elie Confer, MD   800 mg at 08/10/19 1837  .  insulin aspart (novoLOG) injection 0-5 Units  0-5 Units Subcutaneous QHS Radene Gunning, NP   2 Units at 08/10/19 2231  . insulin aspart (novoLOG) injection 0-9 Units  0-9 Units Subcutaneous TID WC Radene Gunning, NP   2 Units at 08/11/19 [phone]  . lactated ringers infusion   Intravenous Continuous Elie Confer, MD 100 mL/hr at 08/10/19 1752 New Bag at 08/10/19 1752  . lidocaine-prilocaine (EMLA) cream   Topical Once Jani Gravel, MD   Stopped at 08/09/19 0047  . metoprolol tartrate (LOPRESSOR) tablet 25 mg  25 mg Oral BID Elie Confer, MD   25 mg at 08/10/19 2217  . nystatin cream (MYCOSTATIN)   Topical BID Elie Confer, MD   Given at 08/10/19 2221  . rosuvastatin (CRESTOR) tablet 20 mg  20 mg Oral q1800 Elie Confer, MD   20 mg at 08/10/19 1739  . sodium bicarbonate tablet 650 mg  650 mg Oral TID Elie Confer, MD   650 mg at 08/10/19 2218  . sodium chloride flush (NS) 0.9 % injection 10-40 mL  10-40 mL Intracatheter Q12H Orma Flaming, MD   10 mL at 08/10/19 0953  . sodium chloride flush (NS) 0.9 % injection 10-40 mL  10-40 mL Intracatheter PRN Orma Flaming, MD      . traMADol Veatrice Bourbon) tablet 100 mg  100 mg Oral Q12H PRN Elie Confer, MD   100 mg at 08/10/19 1739  . traZODone (DESYREL) tablet 25 mg  25 mg Oral QHS PRN Radene Gunning, NP       Facility-Administered Medications Ordered in Other Encounters  Medication Dose Route Frequency Provider Last Rate Last Admin  . sodium chloride flush (NS) 0.9 % injection 10 mL  10 mL Intravenous PRN Eliezer Bottom, NP   10 mL at 09/13/16 1146    Allergies as of 08/08/2019 - Review Complete 08/08/2019  Allergen Reaction Noted  . Adhesive  [tape] Other (See Comments) 01/02/2015  . Codeine Other (See Comments)   . Zocor [simvastatin - high dose] Other (See Comments) 09/07/2011  . Penicillins Rash 01/03/2008    Family History  Problem Relation Age of Onset  . Diabetes Brother   . Alzheimer's disease Father 91  . Cancer Mother 94  . Diabetes Maternal Grandmother   . Cancer Maternal Grandfather        lung  . Healthy Child   . Suicidality Neg Hx   . Depression Neg Hx   . Dementia Neg Hx   . Anxiety disorder Neg Hx     Social History   Socioeconomic History  . Marital status: Divorced    Spouse name: Not on file  . Number of children: 4  . Years of education: Not on file  . Highest education level: High school graduate  Occupational History  . Occupation: Retired Product/process development scientist: [employer]  . Smoking status: Former Smoker    Packs/day: 1.00    Years: 15.00    Pack years: 15.00    Types: Cigarettes    Start date: 09/27/1973    Quit date: 10/15/1988    Years since quitting: 30.8  . Smokeless tobacco: Never Used  . Tobacco comment: quit smoking 14 years ago  Substance and Sexual Activity  . Alcohol use: No    Alcohol/week: 0.0 standard drinks  . Drug use: No  . Sexual activity: Not on file  Other Topics Concern  . Not on file  Social  History Narrative   Lives alone, 4 children live near her. She would like to move to an independent retirement community   Social Determinants of Radio broadcast assistant Strain: Low Risk   . Difficulty of Paying Living Expenses: Not very hard  Food Insecurity: No Food Insecurity  . Worried About Charity fundraiser in the Last Year: Never true  . Ran Out of Food in the Last Year: Never true  Transportation Needs: No Transportation Needs  . Lack of Transportation (Medical): No  . Lack of Transportation (Non-Medical): No  Physical Activity: Insufficiently Active  . Days of Exercise per Week: 3 days  . Minutes of Exercise per Session: 30 min   Stress: No Stress Concern Present  . Feeling of Stress : Not at all  Social Connections: Somewhat Isolated  . Frequency of Communication with Friends and Family: More than three times a week  . Frequency of Social Gatherings with Friends and Family: More than three times a week  . Attends Religious Services: More than 4 times per year  . Active Member of Clubs or Organizations: No  . Attends Archivist Meetings: Never  . Marital Status: Divorced  Human resources officer Violence: Not At Risk  . Fear of Current or Ex-Partner: No  . Emotionally Abused: No  . Physically Abused: No  . Sexually Abused: No    Review of Systems: Positive for: GI: Described in detail in HPI.    Gen: Denies any fever, chills, rigors, night sweats, anorexia, fatigue, weakness, malaise, involuntary weight loss, and sleep disorder CV: Denies chest pain, angina, palpitations, syncope, orthopnea, PND, peripheral edema, and claudication. Resp: Denies dyspnea, cough, sputum, wheezing, coughing up blood. GU : Denies urinary burning, blood in urine, urinary frequency, urinary hesitancy, nocturnal urination, and urinary incontinence. MS: Denies joint pain or swelling.  Denies muscle weakness, cramps, atrophy.  Derm: Periumbilical rash,Denies  itching, oral ulcerations, hives, unhealing ulcers.  Psych: Denies depression, anxiety, memory loss, suicidal ideation, hallucinations,  and confusion. Heme: Denies bruising, bleeding, and enlarged lymph nodes. Neuro: Frequent falls , denies any headaches, dizziness, paresthesias. Endo:  DM, Denies any problems with thyroid, adrenal function.  Physical Exam: Vital signs in last 24 hours: Temp:  [98.3 F (36.8 C)-102.5 F (39.2 C)] 98.3 F (36.8 C) (01/17 0511) Pulse Rate:  [79-130] 79 (01/17 0511) Resp:  [18-23] 18 (01/17 0511) BP: (76-144)/(47-120) 103/76 (01/17 0511) SpO2:  [84 %-99 %] 98 % (01/17 0511) Last BM Date: 08/08/19  Patient was not seen or  examined. History was obtained over the phone from the patient and also from the patient's nurse.    Lab Results: Recent Labs    08/09/19 0108 08/10/19 1130 08/11/19 0500  WBC 10.9* 8.8 10.7*  HGB 9.6* 10.6* 9.7*  HCT 27.5* 29.6* 26.8*  PLT 311 322 278   BMET Recent Labs    08/09/19 0108 08/10/19 1130 08/11/19 0500  NA 141 142 143  K 3.2* 3.5 3.4*  CL 111 114* 114*  CO2 20* 19* 21*  GLUCOSE 153* 222* 196*  BUN 22 15 14   CREATININE 1.91* 1.62* 1.55*  CALCIUM 7.0* 7.3* 7.4*   LFT Recent Labs    08/11/19 0500  PROT 4.4*  ALBUMIN 1.7*  AST 97*  ALT 79*  ALKPHOS 134*  BILITOT 0.8   PT/INR No results for input(s): LABPROT, INR in the last 72 hours.  Studies/Results: CT HEAD WO CONTRAST  Result Date: 08/10/2019 CLINICAL DATA:  Headache.  Suspicion of intracranial  hemorrhage. EXAM: CT HEAD WITHOUT CONTRAST TECHNIQUE: Contiguous axial images were obtained from the base of the skull through the vertex without intravenous contrast. COMPARISON:  03/19/2018 FINDINGS: Brain: Mild age related atrophy. No evidence of old or acute infarction, mass lesion, hemorrhage, hydrocephalus or extra-axial collection. Vascular: There is atherosclerotic calcification of the major vessels at the base of the brain. Skull: Negative Sinuses/Orbits: Clear/normal Other: None IMPRESSION: Normal study for age.  No cause of headache identified. Electronically Signed   By: Nelson Chimes M.D.   On: 08/10/2019 21:00   US Abdomen Complete  Result Date: 08/10/2019 CLINICAL DATA:  70 year old female with abdominal pain. EXAM: ABDOMEN ULTRASOUND COMPLETE COMPARISON:  CT of the abdomen pelvis dated 08/08/2019 FINDINGS: Gallbladder: No gallstones or wall thickening visualized. No sonographic Murphy sign noted by sonographer. Common bile duct: Diameter: 3 mm Liver: There is diffuse increased liver echogenicity most commonly seen in the setting of fatty infiltration. Superimposed inflammation or fibrosis is not  excluded. Clinical correlation is recommended. Portal vein is patent on color Doppler imaging with normal direction of blood flow towards the liver. IVC: No abnormality visualized. Pancreas: Not well visualized. Spleen: Size and appearance within normal limits. Right Kidney: Length: 10 cm. Normal echogenicity. No hydronephrosis or shadowing stone. Left Kidney: Length: 9 cm. Normal echogenicity. No hydronephrosis or shadowing stone. Abdominal aorta: No aneurysm visualized. Other findings: None. IMPRESSION: Fatty liver, otherwise unremarkable abdominal ultrasound. Electronically Signed   By: Anner Crete M.D.   On: 08/10/2019 22:19   DG Abd 2 Views  Result Date: 08/10/2019 CLINICAL DATA:  COVID-19 positive, acute onset lower abdominal pain. EXAM: ABDOMEN - 2 VIEW COMPARISON:  CT abdomen pelvis 08/08/2019 FINDINGS: Few contiguous loops of borderline air distended small bowel in the mid abdomen. Large volume of stool throughout the colon. Air and stool overlying the rectal vault. No suspicious calcifications. Prior right hip arthroplasty is noted. Degenerative changes noted in the lumbar spine, pelvis and left hip. IMPRESSION: Few contiguous loops of increasingly air distended small bowel in the mid abdomen could reflect ileus or early obstruction. Electronically Signed   By: Lovena Le M.D.   On: 08/10/2019 17:55   DG FEMUR 1V LEFT  Result Date: 08/10/2019 CLINICAL DATA:  Pain status post fall EXAM: LEFT FEMUR 1 VIEW COMPARISON:  None. 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 There is no acute displaced fracture. No dislocation. Evaluation is limited by single view technique. Vascular calcifications are noted. IMPRESSION: No definite acute displaced fracture. Electronically Signed   By: Constance Holster M.D.   On: 08/10/2019 19:36   DG FEMUR 1V RIGHT  Result Date: 08/10/2019 CLINICAL DATA:  Pain status post fall. EXAM: RIGHT FEMUR 1 VIEW COMPARISON:  None. FINDINGS:  The patient is status post total hip arthroplasty on the right. Vascular calcifications are noted. There is no displaced fracture. No dislocation. There are few subtle lucencies coursing through the greater trochanter. There are multicompartmental degenerative changes of the right knee. Osteopenia is noted. IMPRESSION: 1. Subtle lucencies are noted coursing through the greater trochanter. If there is clinical concern for a fracture at this level, consider dedicated right hip radiographs for further evaluation. 2. Status post total hip arthroplasty. 3. Multicompartmental degenerative changes are noted of the knee. Electronically Signed   By: Constance Holster M.D.   On: 08/10/2019 19:40    Impression: Abdominal pain related to periumbilical rash, being treated with nystatin.   No nausea, vomiting or diarrhea. CT abdomen shows  progressive severe hepatic steatosis otherwise no acute abnormality Abdominal USG showed fatty liver, otherwise unremarkable Abdominal x-ray showed few contiguous loops of increased air distended small bowel which could reflect ileus or early obstruction, however patient is able to tolerate carbohydrate modified diet without any issues.  History of gastric cancer, status post surgical resection in 2010. Last EGD and colonoscopy were from 2017. Mild anemia, hemoglobin 9.7, MCV 80.5, platelet 278 Slightly elevated AST of 97/ALT 79/ALP 134 with normal T bili of 0.8 likely related to fatty liver Renal impairment, BUN 14, creatinine 1.55, GFR 40 Mild hypokalemia Mild acidosis, bicarb 21 Normal ESR of 2.   Plan: No acute GI pathology identified. Patient complains of periumbilical abdominal soreness related to skin rash which is being treated with nystatin. Imaging studies have been unremarkable. No further recommendations from GI standpoint, please recall GI if needed.   LOS: 2 days   Ronnette Juniper, MD  08/11/2019, 9:33 AM

## 2019-08-11 NOTE — Progress Notes (Signed)
Pharmacy Antibiotic Note  [name] is a 69 y.o. female admitted on 08/08/2019 with COVID infection/sepsis.  Pharmacy has been consulted for Vancomycin dosing. Already on Cefepime.   08/11/2019: Afebrile CRP increasing WBC slightly inc today Scr improving- 1.55 today  Plan: Vancomycin 178m IV x1 now then 15052mIV q36h to target AUC 400-550 Continue Cefepime 2gm IV q12h Monitor renal function and cx data   Height: 5' 6"  (167.6 cm) Weight: 192 lb (87.1 kg) IBW/kg (Calculated) : 59.3  Temp (24hrs), Avg:99.3 F (37.4 C), Min:98.3 F (36.8 C), Max:99.9 F (37.7 C)  Recent Labs  Lab 08/08/19 1323 08/08/19 1341 08/08/19 1400 08/08/19 1655 08/09/19 0108 08/10/19 1130 08/11/19 0500  WBC 14.4*  --   --  15.3* 10.9* 8.8 10.7*  CREATININE  --   --  2.34* 2.18* 1.91* 1.62* 1.55*  LATICACIDVEN  --  2.8*  --  1.5  --   --   --     Estimated Creatinine Clearance: 39.1 mL/min (A) (by C-G formula based on SCr of 1.55 mg/dL (H)).    Allergies  Allergen Reactions  . Adhesive [Tape] Other (See Comments)    Burn Skin  . Codeine Other (See Comments)    paranoid  . Zocor [Simvastatin - High Dose] Other (See Comments)    Muscle spasms  . Penicillins Rash    Has patient had a PCN reaction causing immediate rash, facial/tongue/throat swelling, SOB or lightheadedness with hypotension: Yes Has patient had a PCN reaction causing severe rash involving mucus membranes or skin necrosis: No Has patient had a PCN reaction that required hospitalization does not remember.  Has patient had a PCN reaction occurring within the last 10 years: childhood If all of the above answers are "NO", then may proceed with Cephalosporin use.     Antimicrobials this admission: 1/17 Vanc >>  1/16 Cefepime >>   Dose adjustments this admission:  Microbiology results: 1/16 BCx: NGTD 1/16 GI PCR 12/31 COVID +  Thank you for allowing pharmacy to be a part of this patient's care.  MiNetta Cedars PharmD, BCPS 08/11/2019 6:11 PM

## 2019-08-12 ENCOUNTER — Other Ambulatory Visit: Payer: Self-pay | Admitting: *Deleted

## 2019-08-12 DIAGNOSIS — N179 Acute kidney failure, unspecified: Secondary | ICD-10-CM | POA: Diagnosis not present

## 2019-08-12 DIAGNOSIS — N189 Chronic kidney disease, unspecified: Secondary | ICD-10-CM | POA: Diagnosis not present

## 2019-08-12 DIAGNOSIS — R1010 Upper abdominal pain, unspecified: Secondary | ICD-10-CM

## 2019-08-12 LAB — CBC
HCT: 26.4 % — ABNORMAL LOW (ref 36.0–46.0)
Hemoglobin: 9.2 g/dL — ABNORMAL LOW (ref 12.0–15.0)
MCH: 28.2 pg (ref 26.0–34.0)
MCHC: 34.8 g/dL (ref 30.0–36.0)
MCV: 81 fL (ref 80.0–100.0)
Platelets: 261 10*3/uL (ref 150–400)
RBC: 3.26 MIL/uL — ABNORMAL LOW (ref 3.87–5.11)
RDW: 17 % — ABNORMAL HIGH (ref 11.5–15.5)
WBC: 7.4 10*3/uL (ref 4.0–10.5)
nRBC: 0 % (ref 0.0–0.2)

## 2019-08-12 LAB — COMPREHENSIVE METABOLIC PANEL
ALT: 69 U/L — ABNORMAL HIGH (ref 0–44)
AST: 70 U/L — ABNORMAL HIGH (ref 15–41)
Albumin: 1.9 g/dL — ABNORMAL LOW (ref 3.5–5.0)
Alkaline Phosphatase: 143 U/L — ABNORMAL HIGH (ref 38–126)
Anion gap: 6 (ref 5–15)
BUN: 14 mg/dL (ref 8–23)
CO2: 23 mmol/L (ref 22–32)
Calcium: 7.7 mg/dL — ABNORMAL LOW (ref 8.9–10.3)
Chloride: 114 mmol/L — ABNORMAL HIGH (ref 98–111)
Creatinine, Ser: 1.43 mg/dL — ABNORMAL HIGH (ref 0.44–1.00)
GFR calc Af Amer: 44 mL/min — ABNORMAL LOW (ref >60)
GFR calc non Af Amer: 38 mL/min — ABNORMAL LOW (ref >60)
Glucose, Bld: 144 mg/dL — ABNORMAL HIGH (ref 70–99)
Potassium: 3.5 mmol/L (ref 3.5–5.1)
Sodium: 143 mmol/L (ref 135–145)
Total Bilirubin: 1.5 mg/dL — ABNORMAL HIGH (ref 0.3–1.2)
Total Protein: 4.3 g/dL — ABNORMAL LOW (ref 6.5–8.1)

## 2019-08-12 LAB — C-REACTIVE PROTEIN: CRP: 3.6 mg/dL — ABNORMAL HIGH (ref <1.0)

## 2019-08-12 LAB — GLUCOSE, CAPILLARY
Glucose-Capillary: 122 mg/dL — ABNORMAL HIGH (ref 70–99)
Glucose-Capillary: 135 mg/dL — ABNORMAL HIGH (ref 70–99)
Glucose-Capillary: 94 mg/dL (ref 70–99)
Glucose-Capillary: 118 mg/dL — ABNORMAL HIGH (ref 70–99)

## 2019-08-12 LAB — PHOSPHORUS: Phosphorus: 2.5 mg/dL (ref 2.5–4.6)

## 2019-08-12 LAB — MAGNESIUM: Magnesium: 1.6 mg/dL — ABNORMAL LOW (ref 1.7–2.4)

## 2019-08-12 LAB — SEDIMENTATION RATE: Sed Rate: 5 mm/hr (ref 0–22)

## 2019-08-12 MED ORDER — CEPHALEXIN 500 MG PO CAPS
500.0000 mg | ORAL_CAPSULE | Freq: Four times a day (QID) | ORAL | Status: DC
  Administered 2019-08-12 – 2019-08-13 (×4): 500 mg via ORAL
  Filled 2019-08-12 (×4): qty 1

## 2019-08-12 MED ORDER — SODIUM CHLORIDE 0.9 % IV SOLN
INTRAVENOUS | Status: DC | PRN
  Administered 2019-08-12: 1000 mL via INTRAVENOUS

## 2019-08-12 MED ORDER — GABAPENTIN 400 MG PO CAPS
400.0000 mg | ORAL_CAPSULE | Freq: Three times a day (TID) | ORAL | Status: DC
  Administered 2019-08-12 – 2019-08-14 (×5): 400 mg via ORAL
  Filled 2019-08-12 (×5): qty 1

## 2019-08-12 MED ORDER — NYSTATIN 100000 UNIT/GM EX POWD
Freq: Three times a day (TID) | CUTANEOUS | Status: DC
  Filled 2019-08-12: qty 15

## 2019-08-12 NOTE — Consult Note (Signed)
   [facility] Inpatient Consult   08/12/2019  [name] 1951/03/13 [id number]   Patient chart reviewed due to unplanned readmission risk score of 50%, extreme, and unplanned under 30 day readmission. Patient is currently active with [facility] for chronic disease management services. Patient has been engaged by a [facility].   Will continue to follow for progression and disposition plans.  Of note, [facility] Care Management services does not replace or interfere with any services that are needed or arranged by inpatient case management or social work.  Netta Cedars, MSN, [facility] Liaison Nurse Mobile Phone [phone]  Toll free office [phone]

## 2019-08-12 NOTE — Progress Notes (Signed)
[facility]                                                                              Patient Demographics  [name], is a 69 y.o. female, DOB - 1951-06-13, PPJ:[id number]  Admit date - 08/08/2019   Admitting Physician Orma Flaming, MD  Outpatient Primary MD for the patient is Carollee Herter, Alferd Apa, DO  Outpatient specialists:   LOS - 3  days   Medical records reviewed and are as summarized below:    Chief Complaint  Patient presents with  . Dizziness       Brief summary   [name] a 69 y.o.femalewith diabetes mellitus type 2, hyperlipidemia, asthma, recent hospitalization for Covid 19 presented to ED with generalized weakness, diarrhea, fall, loss of appetite and nausea.  She was discharged from the hospital January 4 feeling better during her time at home she has had no appetite she tries to eat and then feels nauseated. She reports 1 mechanical fall without loss of consciousness or injury yesterday. She reports feeling very weak not having eaten for the last 2 days. She denies headache dizziness syncope or near syncope. She denies chest pain palpitations shortness of breath fever chills. She denies any dysuria hematuria frequency or urgency.    Assessment & Plan    Recent COVID-19 viral pneumonitis/acute respiratory failure with hypoxia -Was discharged on 07/29/2019 currently improving, O2 sats 100% on room air -PT OT evaluation, last seen on 1/15 had recommended SNF. -Patient however wants to return home with home health PT, does not feel ready for discharge today due to weakness  Nausea, diarrhea -Much improved, tolerating p.o. intake   Diabetes mellitus type 2 -Continue sliding scale insulin  Periumbilical rash, abdominal wall pain, likely fungal infection -Seen by GI on 1/17, no acute GI pathology.  Imaging studies unremarkable -Continue nystatin powder 3 times daily  Acute on chronic CKD Baseline creatinine 1.4-1.5, serum  creatinine on presentation 2.34 -Creatinine now at baseline, DC IV fluids, encourage p.o. diet   Essential hypertension Continue amlodipine  Chronic pain syndrome Continue gabapentin, tramadol  CAD Continue Plavix, statin  History of DVT Continue apixaban  SIRS -On 1/16, rapid response with fevers, 102.5 F, hypotensive, circumferential redness noted around the navel with abdominal pain, possible cellulitis -Patient was placed on albumin, midodrine and IV fluids.  DC IV fluids, continue midodrine 10 mg 3 times daily. -BP still soft, hold metoprolol, -Follow cortisol level -Blood cultures negative so far, patient was placed on IV antibiotics, vanc and cefepime, narrow antibiotics to oral Keflex    History of fall.  Patient reported to have fallen x3 at home CT of the head without contrast was negative for any acute intracranial process. X-ray of bilateral femur was negative for any acute intraosseous process except for right knee degenerative changes and left hip arthroplasty. -PT evaluation recommended SNF   Code Status: Full CODE STATUS DVT Prophylaxis: Apixaban Family Communication: Discussed all imaging results, lab results, explained to the patient    Disposition Plan: Possible DC home tomorrow if stable, repeat PT evaluation.  Does not feel ready for discharge today, still feels  weak  Time Spent in minutes   25-minute  Procedures:  None  Consultants:   GI  Antimicrobials:   Anti-infectives (From admission, onward)   Start     Dose/Rate Route Frequency Ordered Stop   08/13/19 1000  vancomycin (VANCOREADY) IVPB 1500 mg/300 mL     1,500 mg 150 mL/hr over 120 Minutes Intravenous Every 36 hours 08/11/19 1834     08/11/19 1800  vancomycin (VANCOREADY) IVPB 1750 mg/350 mL     1,750 mg 175 mL/hr over 120 Minutes Intravenous Every 24 hours 08/11/19 1743     08/10/19 2200  ceFEPIme (MAXIPIME) 2 g in sodium chloride 0.9 % 100 mL IVPB     2 g 200 mL/hr over 30  Minutes Intravenous Every 12 hours 08/10/19 1840     08/10/19 1600  aztreonam (AZACTAM) 1 g in sodium chloride 0.9 % 100 mL IVPB  Status:  Discontinued     1 g 200 mL/hr over 30 Minutes Intravenous Every 8 hours 08/10/19 1544 08/10/19 1840          Medications  Scheduled Meds: . apixaban  5 mg Oral BID  . Chlorhexidine Gluconate Cloth  6 each Topical Daily  . clopidogrel  75 mg Oral Daily  . famotidine  20 mg Oral Daily  . gabapentin  800 mg Oral TID  . insulin aspart  0-5 Units Subcutaneous QHS  . insulin aspart  0-9 Units Subcutaneous TID WC  . lidocaine-prilocaine   Topical Once  . metoprolol tartrate  25 mg Oral BID  . midodrine  10 mg Oral TID WC  . nystatin cream   Topical BID  . rosuvastatin  20 mg Oral q1800  . sodium bicarbonate  650 mg Oral TID  . sodium chloride flush  10-40 mL Intracatheter Q12H   Continuous Infusions: . ceFEPime (MAXIPIME) IV 2 g (08/12/19 0948)  . lactated ringers 100 mL/hr at 08/12/19 0948  . [START ON 08/13/2019] vancomycin    . vancomycin 1,750 mg (08/12/19 1735)   PRN Meds:.acetaminophen **OR** acetaminophen, benzonatate, sodium chloride flush, traMADol, traZODone      Subjective:   [name] was seen and examined today.  No fevers or chills, abdominal wall still feels sore.  BP soft, asymptomatic. Patient denies dizziness, chest pain, shortness of breath,new weakness, numbess, tingling. No acute events overnight.    Objective:   Vitals:   08/12/19 0149 08/12/19 0541 08/12/19 0951 08/12/19 1325  BP: 107/66 107/65 111/67 93/61  Pulse: 87 86 85 82  Resp: 20 16 18 18   Temp: 98.1 F (36.7 C) 98.2 F (36.8 C) 98.5 F (36.9 C) 98.6 F (37 C)  TempSrc: Oral Oral Oral Oral  SpO2: 98%  96% 100%  Weight:      Height:        Intake/Output Summary (Last 24 hours) at 08/12/2019 1737 Last data filed at 08/12/2019 1325 Gross per 24 hour  Intake 1003.3 ml  Output 300 ml  Net 703.3 ml     Wt Readings from Last 3 Encounters:    08/08/19 87.1 kg  07/26/19 96.2 kg  07/24/19 95.3 kg     Exam  General: Alert and oriented x 3, NAD  Eyes:   HEENT:    Cardiovascular: S1 S2 auscultated, no murmurs, RRR  Respiratory: Clear to auscultation bilaterally, no wheezing, rales or rhonchi  Gastrointestinal: Periumbilical area with rash appears to be fungal infection, tenderness  Ext: no pedal edema bilaterally  Neuro: No new deficit  Musculoskeletal: No digital  cyanosis, clubbing  Skin: No rashes  Psych: Normal affect and demeanor, alert and oriented x3    Data Reviewed:  I have personally reviewed following labs and imaging studies  Micro Results Recent Results (from the past 240 hour(s))  Culture, blood (routine x 2)     Status: None (Preliminary result)   Collection Time: 08/10/19  4:41 PM   Specimen: BLOOD  Result Value Ref Range Status   Specimen Description   Final    BLOOD RIGHT ANTECUBITAL Performed at [facility] [address], [place], [place] [address]    Special Requests   Final    BOTTLES DRAWN AEROBIC AND ANAEROBIC Blood Culture adequate volume Performed at [facility] [address], [place], [place] [address]    Culture   Final    NO GROWTH 2 DAYS Performed at [facility] [address] [place], [place] [address]    Report Status PENDING  Incomplete  Culture, blood (routine x 2)     Status: None (Preliminary result)   Collection Time: 08/10/19  4:41 PM   Specimen: BLOOD  Result Value Ref Range Status   Specimen Description   Final    BLOOD RIGHT WRIST Performed at [facility] [address], [place], [place] [address]    Special Requests   Final    BOTTLES DRAWN AEROBIC ONLY Blood Culture adequate volume Performed at [facility] [address] [place], [place] [address]    Culture   Final    NO GROWTH 2 DAYS Performed at [facility] [address] [place], [place]  [address]    Report Status PENDING  Incomplete    Radiology Reports CT ABDOMEN PELVIS WO CONTRAST  Result Date: 08/08/2019 CLINICAL DATA:  Nausea and vomiting. Hypotension. Weakness and dizziness. Diarrhea. COVID-19. EXAM: CT ABDOMEN AND PELVIS WITHOUT CONTRAST TECHNIQUE: Multidetector CT imaging of the abdomen and pelvis was performed following the standard protocol without IV contrast. COMPARISON:  CT scan dated 03/22/2019 FINDINGS: Lower chest: There are small patchy peripheral infiltrates at both lung bases consistent with COVID pneumonia. Heart size is normal. Coronary artery calcifications. Coronary stents. No pericardial effusion. Small hiatal hernia, chronic. Hepatobiliary: Severe progressive hepatic steatosis. Gallbladder is distended. No dilated bile ducts. Pancreas: Diffuse pancreatic atrophy, unchanged. Otherwise negative. Spleen: Normal in size without focal abnormality. Adrenals/Urinary Tract: Normal adrenal glands. Slight left renal atrophy, chronic. Compensatory hypertrophy of the right kidney. No hydronephrosis. The visualized portion of the bladder is normal. Bladder detail is obscured by metallic artifact from the right hip prosthesis. Stomach/Bowel: Small hiatal hernia. Surgical staples in the stomach, duodenum, and at the tip of the cecum. Prior appendectomy. There are few diverticula in the sigmoid portion of the colon. No acute bowel abnormality. Vascular/Lymphatic: Aortic atherosclerosis. No enlarged abdominal or pelvic lymph nodes. Reproductive: The area of the uterus and ovaries are obscured by metallic artifact. Other: No abdominal wall hernia or abnormality. No abdominopelvic ascites. Musculoskeletal: No acute abnormalities. IMPRESSION: 1. Small patchy peripheral infiltrates at both lung bases consistent with COVID pneumonia. 2. Severe progressive hepatic steatosis. 3. No acute abnormality of the abdomen and pelvis. 4. Aortic Atherosclerosis (ICD10-I70.0). Electronically Signed   By:  Lorriane Shire M.D.   On: 08/08/2019 15:36   CT HEAD WO CONTRAST  Result Date: 08/10/2019 CLINICAL DATA:  Headache.  Suspicion of intracranial hemorrhage. EXAM: CT HEAD WITHOUT CONTRAST TECHNIQUE: Contiguous axial images were obtained from the base of the  skull through the vertex without intravenous contrast. COMPARISON:  03/19/2018 FINDINGS: Brain: Mild age related atrophy. No evidence of old or acute infarction, mass lesion, hemorrhage, hydrocephalus or extra-axial collection. Vascular: There is atherosclerotic calcification of the major vessels at the base of the brain. Skull: Negative Sinuses/Orbits: Clear/normal Other: None IMPRESSION: Normal study for age.  No cause of headache identified. Electronically Signed   By: Nelson Chimes M.D.   On: 08/10/2019 21:00   US Abdomen Complete  Result Date: 08/10/2019 CLINICAL DATA:  69 year old female with abdominal pain. EXAM: ABDOMEN ULTRASOUND COMPLETE COMPARISON:  CT of the abdomen pelvis dated 08/08/2019 FINDINGS: Gallbladder: No gallstones or wall thickening visualized. No sonographic Murphy sign noted by sonographer. Common bile duct: Diameter: 3 mm Liver: There is diffuse increased liver echogenicity most commonly seen in the setting of fatty infiltration. Superimposed inflammation or fibrosis is not excluded. Clinical correlation is recommended. Portal vein is patent on color Doppler imaging with normal direction of blood flow towards the liver. IVC: No abnormality visualized. Pancreas: Not well visualized. Spleen: Size and appearance within normal limits. Right Kidney: Length: 10 cm. Normal echogenicity. No hydronephrosis or shadowing stone. Left Kidney: Length: 9 cm. Normal echogenicity. No hydronephrosis or shadowing stone. Abdominal aorta: No aneurysm visualized. Other findings: None. IMPRESSION: Fatty liver, otherwise unremarkable abdominal ultrasound. Electronically Signed   By: Anner Crete M.D.   On: 08/10/2019 22:19   DG Chest Port 1  View  Result Date: 08/08/2019 CLINICAL DATA:  Shortness of breath EXAM: PORTABLE CHEST 1 VIEW COMPARISON:  July 29, 2019 FINDINGS: Port-A-Cath tip is in the superior vena cava. No pneumothorax. There is a small right pleural effusion. There is no edema or consolidation. Heart is upper normal in size with pulmonary vascularity normal. No evident adenopathy. There is degenerative change in the left shoulder. IMPRESSION: Central catheter position unchanged without pneumothorax. Small right pleural effusion. No edema or consolidation. Stable cardiac silhouette. No demonstrable adenopathy. Electronically Signed   By: Lowella Grip III M.D.   On: 08/08/2019 14:38   DG CHEST PORT 1 VIEW  Result Date: 07/29/2019 CLINICAL DATA:  Hypoxia EXAM: PORTABLE CHEST 1 VIEW COMPARISON:  07/27/2019 FINDINGS: The heart size and mediastinal contours are within normal limits. Right chest port catheter. Both lungs are clear. The visualized skeletal structures are unremarkable. IMPRESSION: No acute abnormality of the lungs in AP portable projection. Electronically Signed   By: Eddie Candle M.D.   On: 07/29/2019 08:10   DG CHEST PORT 1 VIEW  Result Date: 07/27/2019 CLINICAL DATA:  hypoxia EXAM: PORTABLE CHEST 1 VIEW COMPARISON:  Chest radiograph 08/24/2018 FINDINGS: Enlargement of the cardiomediastinal contours likely secondary to AP technique. Right central venous catheter is unchanged. The lungs are clear. No pneumothorax or large pleural effusion. No acute finding in the visualized skeleton. IMPRESSION: No evidence of active disease. Electronically Signed   By: Audie Pinto M.D.   On: 07/27/2019 10:45   DG Chest Port 1 View  Result Date: 07/25/2019 CLINICAL DATA:  Fever, chills EXAM: PORTABLE CHEST 1 VIEW COMPARISON:  05/14/2019 FINDINGS: Right Port-A-Cath in place, unchanged. Heart and mediastinal contours are within normal limits. No focal opacities or effusions. No acute bony abnormality. IMPRESSION: No active  disease. Electronically Signed   By: Rolm Baptise M.D.   On: 07/25/2019 11:44   DG Abd 2 Views  Result Date: 08/10/2019 CLINICAL DATA:  COVID-19 positive, acute onset lower abdominal pain. EXAM: ABDOMEN - 2 VIEW COMPARISON:  CT abdomen pelvis 08/08/2019 FINDINGS: Few contiguous  loops of borderline air distended small bowel in the mid abdomen. Large volume of stool throughout the colon. Air and stool overlying the rectal vault. No suspicious calcifications. Prior right hip arthroplasty is noted. Degenerative changes noted in the lumbar spine, pelvis and left hip. IMPRESSION: Few contiguous loops of increasingly air distended small bowel in the mid abdomen could reflect ileus or early obstruction. Electronically Signed   By: Lovena Le M.D.   On: 08/10/2019 17:55   DG FEMUR 1V LEFT  Result Date: 08/10/2019 CLINICAL DATA:  Pain status post fall EXAM: LEFT FEMUR 1 VIEW COMPARISON:  None. 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 There is no acute displaced fracture. No dislocation. Evaluation is limited by single view technique. Vascular calcifications are noted. IMPRESSION: No definite acute displaced fracture. Electronically Signed   By: Constance Holster M.D.   On: 08/10/2019 19:36   DG FEMUR 1V RIGHT  Result Date: 08/10/2019 CLINICAL DATA:  Pain status post fall. EXAM: RIGHT FEMUR 1 VIEW COMPARISON:  None. FINDINGS: The patient is status post total hip arthroplasty on the right. Vascular calcifications are noted. There is no displaced fracture. No dislocation. There are few subtle lucencies coursing through the greater trochanter. There are multicompartmental degenerative changes of the right knee. Osteopenia is noted. IMPRESSION: 1. Subtle lucencies are noted coursing through the greater trochanter. If there is clinical concern for a fracture at this level, consider dedicated right hip radiographs for further evaluation. 2. Status post total hip arthroplasty. 3.  Multicompartmental degenerative changes are noted of the knee. Electronically Signed   By: Constance Holster M.D.   On: 08/10/2019 19:40   VAS [place] LOWER EXTREMITY VENOUS (DVT)  Result Date: 07/28/2019  Lower Venous Study Indications: Elevated Ddimer.  Risk Factors: COVID 19 positive. Limitations: Body habitus and poor ultrasound/tissue interface. Comparison Study: No prior studies. Performing Technologist: Oliver Hum RVT  Examination Guidelines: A complete evaluation includes B-mode imaging, spectral Doppler, color Doppler, and power Doppler as needed of all accessible portions of each vessel. Bilateral testing is considered an integral part of a complete examination. Limited examinations for reoccurring indications may be performed as noted.  +---------+---------------+---------+-----------+----------+--------------+ RIGHT    CompressibilityPhasicitySpontaneityPropertiesThrombus Aging +---------+---------------+---------+-----------+----------+--------------+ CFV      Full           Yes      Yes                                 +---------+---------------+---------+-----------+----------+--------------+ SFJ      Full                                                        +---------+---------------+---------+-----------+----------+--------------+ FV Prox  Full                                                        +---------+---------------+---------+-----------+----------+--------------+ FV Mid   Full                                                        +---------+---------------+---------+-----------+----------+--------------+  FV DistalFull                                                        +---------+---------------+---------+-----------+----------+--------------+ PFV      Full                                                        +---------+---------------+---------+-----------+----------+--------------+ POP      Full           Yes      Yes                                  +---------+---------------+---------+-----------+----------+--------------+ PTV      Full                                                        +---------+---------------+---------+-----------+----------+--------------+ PERO     Partial                                      Acute          +---------+---------------+---------+-----------+----------+--------------+   +---------+---------------+---------+-----------+----------+--------------+ LEFT     CompressibilityPhasicitySpontaneityPropertiesThrombus Aging +---------+---------------+---------+-----------+----------+--------------+ CFV      Full           Yes      Yes                                 +---------+---------------+---------+-----------+----------+--------------+ SFJ      Full                                                        +---------+---------------+---------+-----------+----------+--------------+ FV Prox  Full                                                        +---------+---------------+---------+-----------+----------+--------------+ FV Mid   Full                                                        +---------+---------------+---------+-----------+----------+--------------+ FV DistalFull                                                        +---------+---------------+---------+-----------+----------+--------------+  PFV      Full                                                        +---------+---------------+---------+-----------+----------+--------------+ POP      Full           Yes      Yes                                 +---------+---------------+---------+-----------+----------+--------------+ PTV      Full                                                        +---------+---------------+---------+-----------+----------+--------------+ PERO     Full                                                         +---------+---------------+---------+-----------+----------+--------------+     Summary: Right: Findings consistent with acute deep vein thrombosis involving the right peroneal veins. No cystic structure found in the popliteal fossa. Left: There is no evidence of deep vein thrombosis in the lower extremity. No cystic structure found in the popliteal fossa.  *See table(s) above for measurements and observations. Electronically signed by Deitra Mayo MD on 07/28/2019 at 5:05:04 PM.    Final     Lab Data:  CBC: Recent Labs  Lab 08/08/19 1655 08/09/19 0108 08/10/19 1130 08/11/19 0500 08/12/19 0341  WBC 15.3* 10.9* 8.8 10.7* 7.4  HGB 10.6* 9.6* 10.6* 9.7* 9.2*  HCT 31.1* 27.5* 29.6* 26.8* 26.4*  MCV 84.1 82.3 80.7 80.5 81.0  PLT 361 311 322 278 591   Basic Metabolic Panel: Recent Labs  Lab 08/08/19 1400 08/08/19 1400 08/08/19 1655 08/09/19 0108 08/10/19 1130 08/11/19 0500 08/12/19 0341  NA 141  --   --  141 142 143 143  K 4.5  --   --  3.2* 3.5 3.4* 3.5  CL 109  --   --  111 114* 114* 114*  CO2 20*  --   --  20* 19* 21* 23  GLUCOSE 139*  --   --  153* 222* 196* 144*  BUN 24*  --   --  22 15 14 14   CREATININE 2.34*   < > 2.18* 1.91* 1.62* 1.55* 1.43*  CALCIUM 7.6*  --   --  7.0* 7.3* 7.4* 7.7*  MG  --   --  1.7  --  1.5*  --  1.6*  PHOS  --   --   --   --  2.7  --  2.5   < > = values in this interval not displayed.   GFR: Estimated Creatinine Clearance: 42.4 mL/min (A) (by C-G formula based on SCr of 1.43 mg/dL (H)). Liver Function Tests: Recent Labs  Lab 08/08/19 1400 08/10/19 1130 08/11/19 0500 08/12/19 0341  AST 176* 167* 97* 70*  ALT 68* 88* 79* 69*  ALKPHOS 122 162* 134* 143*  BILITOT  1.2 1.0 0.8 1.5*  PROT 5.3* 5.0* 4.4* 4.3*  ALBUMIN 2.0* 1.8* 1.7* 1.9*   Recent Labs  Lab 08/08/19 1400  LIPASE 11   No results for input(s): AMMONIA in the last 168 hours. Coagulation Profile: No results for input(s): INR, PROTIME in the last 168 hours. Cardiac  Enzymes: No results for input(s): CKTOTAL, CKMB, CKMBINDEX, TROPONINI in the last 168 hours. BNP (last 3 results) No results for input(s): PROBNP in the last 8760 hours. HbA1C: No results for input(s): HGBA1C in the last 72 hours. CBG: Recent Labs  Lab 08/11/19 1648 08/11/19 2046 08/12/19 0728 08/12/19 1150 08/12/19 1709  GLUCAP 144* 150* 122* 135* 94   Lipid Profile: No results for input(s): CHOL, HDL, LDLCALC, TRIG, CHOLHDL, LDLDIRECT in the last 72 hours. Thyroid Function Tests: No results for input(s): TSH, T4TOTAL, FREET4, T3FREE, THYROIDAB in the last 72 hours. Anemia Panel: No results for input(s): VITAMINB12, FOLATE, FERRITIN, TIBC, IRON, RETICCTPCT in the last 72 hours. Urine analysis:    Component Value Date/Time   COLORURINE AMBER (A) 08/08/2019 1523   APPEARANCEUR HAZY (A) 08/08/2019 1523   LABSPEC 1.015 08/08/2019 1523   LABSPEC 1.015 04/06/2015 0939   PHURINE 5.0 08/08/2019 1523   GLUCOSEU NEGATIVE 08/08/2019 1523   HGBUR LARGE (A) 08/08/2019 1523   BILIRUBINUR NEGATIVE 08/08/2019 1523   BILIRUBINUR 1+ 10/01/2018 1146   KETONESUR 5 (A) 08/08/2019 1523   PROTEINUR 30 (A) 08/08/2019 1523   UROBILINOGEN 0.2 10/01/2018 1146   UROBILINOGEN 0.2 04/06/2015 0939   NITRITE NEGATIVE 08/08/2019 1523   LEUKOCYTESUR MODERATE (A) 08/08/2019 1523     Raya Mckinstry M.D. [facility] 08/12/2019, 5:37 PM   Call night coverage person covering after 7pm

## 2019-08-13 DIAGNOSIS — R531 Weakness: Secondary | ICD-10-CM

## 2019-08-13 DIAGNOSIS — I25118 Atherosclerotic heart disease of native coronary artery with other forms of angina pectoris: Secondary | ICD-10-CM | POA: Diagnosis not present

## 2019-08-13 DIAGNOSIS — G894 Chronic pain syndrome: Secondary | ICD-10-CM | POA: Diagnosis not present

## 2019-08-13 DIAGNOSIS — I951 Orthostatic hypotension: Secondary | ICD-10-CM | POA: Diagnosis not present

## 2019-08-13 DIAGNOSIS — W19XXXS Unspecified fall, sequela: Secondary | ICD-10-CM | POA: Diagnosis not present

## 2019-08-13 DIAGNOSIS — R197 Diarrhea, unspecified: Secondary | ICD-10-CM | POA: Diagnosis not present

## 2019-08-13 DIAGNOSIS — I10 Essential (primary) hypertension: Secondary | ICD-10-CM

## 2019-08-13 DIAGNOSIS — R110 Nausea: Secondary | ICD-10-CM

## 2019-08-13 DIAGNOSIS — E1121 Type 2 diabetes mellitus with diabetic nephropathy: Secondary | ICD-10-CM | POA: Diagnosis not present

## 2019-08-13 DIAGNOSIS — N179 Acute kidney failure, unspecified: Secondary | ICD-10-CM | POA: Diagnosis not present

## 2019-08-13 LAB — GI PATHOGEN PANEL BY PCR, STOOL
Adenovirus F 40/41: NOT DETECTED
Astrovirus: NOT DETECTED
Campylobacter by PCR: NOT DETECTED
Cryptosporidium by PCR: NOT DETECTED
Cyclospora cayetanensis: NOT DETECTED
E coli (ETEC) LT/ST: NOT DETECTED
E coli (STEC): NOT DETECTED
Entamoeba histolytica: NOT DETECTED
Enteroaggregative E coli: NOT DETECTED
Enteropathogenic E coli: DETECTED — AB
G lamblia by PCR: NOT DETECTED
Norovirus GI/GII: NOT DETECTED
Plesiomonas shigelloides: NOT DETECTED
Rotavirus A by PCR: NOT DETECTED
Salmonella by PCR: NOT DETECTED
Sapovirus: NOT DETECTED
Shigella by PCR: NOT DETECTED
Vibrio cholerae: NOT DETECTED
Vibrio: NOT DETECTED
Yersinia enterocolitica: NOT DETECTED

## 2019-08-13 LAB — GLUCOSE, CAPILLARY
Glucose-Capillary: 110 mg/dL — ABNORMAL HIGH (ref 70–99)
Glucose-Capillary: 137 mg/dL — ABNORMAL HIGH (ref 70–99)
Glucose-Capillary: 168 mg/dL — ABNORMAL HIGH (ref 70–99)
Glucose-Capillary: 158 mg/dL — ABNORMAL HIGH (ref 70–99)

## 2019-08-13 LAB — CORTISOL: Cortisol, Plasma: 10.6 ug/dL

## 2019-08-13 MED ORDER — DIPHENHYDRAMINE HCL 50 MG/ML IJ SOLN
25.0000 mg | Freq: Four times a day (QID) | INTRAMUSCULAR | Status: DC | PRN
  Administered 2019-08-13: 25 mg via INTRAVENOUS
  Filled 2019-08-13: qty 1

## 2019-08-13 MED ORDER — LEVOFLOXACIN 750 MG PO TABS
750.0000 mg | ORAL_TABLET | ORAL | Status: DC
  Administered 2019-08-14: 750 mg via ORAL
  Filled 2019-08-13: qty 1

## 2019-08-13 MED ORDER — SODIUM CHLORIDE 0.9 % IV BOLUS
500.0000 mL | Freq: Once | INTRAVENOUS | Status: AC
  Administered 2019-08-13: 500 mL via INTRAVENOUS

## 2019-08-13 MED ORDER — GLUCERNA SHAKE PO LIQD
237.0000 mL | Freq: Three times a day (TID) | ORAL | Status: DC
  Administered 2019-08-13 – 2019-08-14 (×3): 237 mL via ORAL
  Filled 2019-08-13 (×5): qty 237

## 2019-08-13 MED ORDER — METHYLPREDNISOLONE SODIUM SUCC 40 MG IJ SOLR
40.0000 mg | Freq: Two times a day (BID) | INTRAMUSCULAR | Status: DC
  Administered 2019-08-13 – 2019-08-14 (×2): 40 mg via INTRAVENOUS
  Filled 2019-08-13 (×2): qty 1

## 2019-08-13 MED ORDER — INSULIN ASPART 100 UNIT/ML ~~LOC~~ SOLN: 0.0000 [IU] | Freq: Every day | SUBCUTANEOUS | Status: DC

## 2019-08-13 MED ORDER — INSULIN ASPART 100 UNIT/ML ~~LOC~~ SOLN
0.0000 [IU] | Freq: Three times a day (TID) | SUBCUTANEOUS | Status: DC
  Administered 2019-08-14 (×2): 8 [IU] via SUBCUTANEOUS

## 2019-08-13 NOTE — Progress Notes (Signed)
Physical Therapy Treatment Patient Details Name: [name] MRN: [id number] DOB: [birth year] Today's Date: 08/13/2019    History of Present Illness 69 y.o. female with past medical history significant for DM2, HLD, Asthma admitted to ED on 1/14 with falls, covid PNA diagnosed originally on 12/31 with GV stay. CT abd/pelvis negative for acute abnormality.    PT Comments    PT called to pt room by NTs due to pt feeling dizzy and unable to get off toilet. Pt required max +2 assist for squat pivot transfers today, was unable to come to full standing due to pt fatigue and weakness which is dramatically worse mobility-[place] for pt vs PT eval last week. Pt also acutely dizzy this session, with BP 111/71 which is technically orthostatic from BP this am. Pt describes dizziness as "the room is spinning", states it is lasting ~2 minutes today, and this dizziness comes and goes for her. Pt reports there is no pattern that she knows of with dizziness. Pt too fatigued and weak to participate in vestibular testing this session, but PT will initiate next session given pt's history of recent falls. PT to continue to follow acutely, will progress mobility as tolerated.    Follow Up Recommendations  SNF;Home health PT;Supervision/Assistance - 24 hour     Equipment Recommendations  None recommended by PT    Recommendations for Other Services       Precautions / Restrictions Precautions Precautions: Fall Restrictions Weight Bearing Restrictions: No    Mobility  Bed Mobility Overal bed mobility: Needs Assistance Bed Mobility: Sit to Supine       Sit to supine: Max assist;+2 for physical assistance;+2 for safety/equipment;HOB elevated   General bed mobility comments: Pt OOB upon PT arrival to room. max +2 for sit to supine for LE lifting and translation into bed, trunk lowering, scooting pt up in bed with use of bedpads upon return to supine.  Transfers Overall transfer level: Needs  assistance Equipment used: Rolling walker (2 wheeled);2 person hand held assist Transfers: Squat Pivot Transfers     Squat pivot transfers: Max assist;+2 safety/equipment;+2 physical assistance     General transfer comment: Attempted sit to stand with RW x2 with +2 assist, unable to get pt to come to standing even with max assistance. PT opted for squat pivot from toilet to recliner, then from recliner to bed. Max +2 for lifting pt mass, translating trunk forward, and translation of pt to destination surface. Verbal cuing for anterior trunk leaning to offweight gluteal region during pivot, scooting back from EOB and edge of chair.  Ambulation/Gait             General Gait Details: unable to attempt   Stairs             Wheelchair Mobility    Modified Rankin (Stroke Patients Only)       Balance Overall balance assessment: Needs assistance Sitting-balance support: Feet supported Sitting balance-Leahy Scale: Fair       Standing balance-Leahy Scale: Poor Standing balance comment: unable to come to standing today, pt fatigue and reported LE weakness                            Cognition Arousal/Alertness: Awake/alert Behavior During Therapy: WFL for tasks assessed/performed Overall Cognitive Status: Within Functional Limits for tasks assessed  General Comments: pt tearful when having difficulty getting off toilet      Exercises      General Comments General comments (skin integrity, edema, etc.): Pt reporting dizziness this session, BP and HR 111/71 and 93 bpm respectively (which is orthostatic vs last BP but last BP taken at 0620)      Pertinent Vitals/Pain Pain Assessment: Faces Faces Pain Scale: Hurts even more Pain Location: sacrum Pain Descriptors / Indicators: Grimacing;Discomfort;Sore Pain Intervention(s): Limited activity within patient's tolerance;Monitored during session;Repositioned     Home Living                      Prior Function            PT Goals (current goals can now be found in the care plan section) Acute Rehab PT Goals Patient Stated Goal: go to rehab to get stronger PT Goal Formulation: With patient Time For Goal Achievement: 08/23/19 Potential to Achieve Goals: Good Progress towards PT goals: Not progressing toward goals - comment(pt limited by weakness and acute dizziness)    Frequency    Min 2X/week      PT Plan Current plan remains appropriate    Co-evaluation              AM-PAC PT "6 Clicks" Mobility   Outcome Measure  Help needed turning from your back to your side while in a flat bed without using bedrails?: A Lot Help needed moving from lying on your back to sitting on the side of a flat bed without using bedrails?: A Lot Help needed moving to and from a bed to a chair (including a wheelchair)?: Total Help needed standing up from a chair using your arms (e.g., wheelchair or bedside chair)?: Total Help needed to walk in hospital room?: Total Help needed climbing 3-5 steps with a railing? : Total 6 Click Score: 8    End of Session Equipment Utilized During Treatment: Gait belt Activity Tolerance: Treatment limited secondary to medical complications (Comment);Patient limited by fatigue(acute dizziness) Patient left: with call bell/phone within reach;in bed;with bed alarm set Nurse Communication: Mobility status PT Visit Diagnosis: Other abnormalities of gait and mobility (R26.89);Muscle weakness (generalized) (M62.81)     Time: 3016-0109 PT Time Calculation (min) (ACUTE ONLY): 15 min  Charges:  $Therapeutic Activity: 8-22 mins                     Ryah Cribb E, PT Acute Rehabilitation Services Pager [phone]  Office [phone]   Gianella Chismar D Thamara Leger 08/13/2019, 2:06 PM

## 2019-08-13 NOTE — Care Management Important Message (Signed)
Important Message  Patient Details IM Letter given to Evette Cristal SW Case Manager to present to the Patient Name: [name] MRN: [id number] Date of Birth: [birth year]   Medicare Important Message Given:  Yes     Kerin Salen 08/13/2019, 12:06 PM

## 2019-08-13 NOTE — Plan of Care (Signed)
  Problem: Education: Goal: Knowledge of General Education information will improve Description: Including pain rating scale, medication(s)/side effects and non-pharmacologic comfort measures Outcome: Progressing   Problem: Clinical Measurements: Goal: Respiratory complications will improve Outcome: Progressing Goal: Cardiovascular complication will be avoided Outcome: Progressing   Problem: Activity: Goal: Risk for activity intolerance will decrease Outcome: Progressing   Problem: Elimination: Goal: Will not experience complications related to bowel motility Outcome: Progressing Goal: Will not experience complications related to urinary retention Outcome: Progressing   Problem: Pain Managment: Goal: General experience of comfort will improve Outcome: Progressing   Problem: Safety: Goal: Ability to remain free from injury will improve Outcome: Progressing   Problem: Tissue Perfusion: Goal: Adequacy of tissue perfusion will improve Outcome: Progressing   Problem: Education: Goal: Knowledge of risk factors and measures for prevention of condition will improve Outcome: Progressing   Problem: Coping: Goal: Psychosocial and spiritual needs will be supported Outcome: Progressing   Problem: Respiratory: Goal: Will maintain a patent airway Outcome: Progressing Goal: Complications related to the disease process, condition or treatment will be avoided or minimized Outcome: Progressing

## 2019-08-13 NOTE — Progress Notes (Signed)
   Vital Signs MEWS/VS Documentation      08/13/2019 1159 08/13/2019 1548 08/13/2019 1551 08/13/2019 1554   MEWS Score:  0  1  2  3    MEWS Score Color:  Green  Green  Yellow  Yellow   Pulse:  93  97  (!) 118  (!) 118   BP:  111/71  91/62  106/81  (!) 82/44           Patient becomes tachycardiac when standing. BP also dropped. These vital signs during an orthostatic assessment.   MD aware of patient vital signs.   Will implement yellow MEWS guidelines per protocol.     Benedetto Goad J 08/13/2019,4:22 PM

## 2019-08-13 NOTE — Progress Notes (Signed)
PROGRESS NOTE  [name] UVO:[id number] DOB: [birth year] DOA: 08/08/2019 PCP: Ann Held, DO  Brief History   [place] a 69 y.o.femalewith diabetes mellitus type 2, hyperlipidemia, asthma, recent hospitalization for Covid 19 presented to ED with generalized weakness, diarrhea, fall, loss of appetite and nausea.  She was discharged from the hospital January 4 feeling better during her time at home she has had no appetite she tries to eat and then feels nauseated. She reports 1 mechanical fall without loss of consciousness or injury yesterday. She reports feeling very weak not having eaten for the last 2 days. She denies headache dizziness syncope or near syncope. She denies chest pain palpitations shortness of breath fever chills. She denies any dysuria hematuria frequency or urgency.   On 08/12/2019 Gastroenterology was consulted for evaluation of abddominal pain that was periumbilical and was related to a rash in the same area. Imaging studies were unremarkable. Nystatin was used for the rash. Of note on that date the patient's antibiotics had been changed from cefepime to keflex. Today the patient complained of feeling that her throat was closing up both occurred after receiving Keflex. Her antibiotic has been changed to Levaquin on the advice of pharmacy.  On 08/13/2019 the patient complained of lightheadedness after using the bathroom. Orthostatics were performed and were positive.   Consultants  . Gastroenterology  Procedures  . None  Antibiotics   Anti-infectives (From admission, onward)   Start     Dose/Rate Route Frequency Ordered Stop   08/14/19 1000  levofloxacin (LEVAQUIN) tablet 750 mg     750 mg Oral Every 48 hours 08/13/19 1526     08/13/19 1000  vancomycin (VANCOREADY) IVPB 1500 mg/300 mL  Status:  Discontinued     1,500 mg 150 mL/hr over 120 Minutes Intravenous Every 36 hours 08/11/19 1834 08/12/19 1758   08/12/19 1800  cephALEXin (KEFLEX) capsule  500 mg  Status:  Discontinued     500 mg Oral Every 6 hours 08/12/19 1758 08/13/19 1523   08/11/19 1800  vancomycin (VANCOREADY) IVPB 1750 mg/350 mL  Status:  Discontinued     1,750 mg 175 mL/hr over 120 Minutes Intravenous Every 24 hours 08/11/19 1743 08/12/19 1758   08/10/19 2200  ceFEPIme (MAXIPIME) 2 g in sodium chloride 0.9 % 100 mL IVPB  Status:  Discontinued     2 g 200 mL/hr over 30 Minutes Intravenous Every 12 hours 08/10/19 1840 08/12/19 1758   08/10/19 1600  aztreonam (AZACTAM) 1 g in sodium chloride 0.9 % 100 mL IVPB  Status:  Discontinued     1 g 200 mL/hr over 30 Minutes Intravenous Every 8 hours 08/10/19 1544 08/10/19 1840    .  Subjective  The patient is complaining of lightheadedness after using the toilet. She also has complained of feeling that her throat was closing up today.  Objective   Vitals:  Vitals:   08/13/19 1615 08/13/19 1809  BP:  123/71  Pulse:  91  Resp:  16  Temp:  98.3 F (36.8 C)  SpO2: 97%    Exam:  Constitutional:  . The patient is awake, alert, and oriented x 3. She is mild distress from lightheadedness. Respiratory:  . No increased work of breathing. . No wheezes, rales, or rhonchi . No tactile fremitus Cardiovascular:  . Regular rate and rhythm . No murmurs, ectopy, or gallups. . No lateral PMI. No thrills. Abdomen:  . Abdomen is soft, non-tender, non-distended . No hernias, masses, or organomegaly .  Normoactive bowel sounds.  Musculoskeletal:  . No cyanosis, clubbing, or edema Skin:  . No rashes, lesions, ulcers . palpation of skin: no induration or nodules Neurologic:  . CN 2-12 intact . Sensation all 4 extremities intact Psychiatric:  . Mental status o Mood, affect appropriate o Orientation to person, place, time  . judgment and insight appear intact I have personally reviewed the following:   Today's Data  . Vitals, IT trainer  . Blood cultures x 2 - no growth  Scheduled Meds: . apixaban  5 mg  Oral BID  . Chlorhexidine Gluconate Cloth  6 each Topical Daily  . clopidogrel  75 mg Oral Daily  . famotidine  20 mg Oral Daily  . feeding supplement (GLUCERNA SHAKE)  237 mL Oral TID BM  . gabapentin  400 mg Oral TID  . insulin aspart  0-5 Units Subcutaneous QHS  . insulin aspart  0-9 Units Subcutaneous TID WC  . [START ON 08/14/2019] levofloxacin  750 mg Oral Q48H  . lidocaine-prilocaine   Topical Once  . methylPREDNISolone (SOLU-MEDROL) injection  40 mg Intravenous Q12H  . nystatin   Topical TID  . rosuvastatin  20 mg Oral q1800  . sodium bicarbonate  650 mg Oral TID  . sodium chloride flush  10-40 mL Intracatheter Q12H   Continuous Infusions: . sodium chloride 1,000 mL (08/12/19 2044)    Active Problems:   Nausea   Type 2 diabetes with nephropathy (HCC)   Chronic pain syndrome   Diarrhea   Acute kidney injury superimposed on chronic kidney disease ([place])   Weakness   CAD (coronary artery disease)   Fall   Hypertension   LOS: 4 days   A & P   Recent COVID-19 viral pneumonitis/acute respiratory failure with hypoxia: The patient was discharged on 07/29/2019 currently improving, O2 sats 100% on room air. PT OT evaluation, last seen today continues to recommended SNF. Patient however wants to return home with home health PT. The patient is not safe for discharge today due to orthostasis. Will give a fluid bolus.   Nausea, diarrhea: Resolved. She is tolerating p.o. intake.  Diabetes mellitus type 2: The patient's glucoses are well controlled currently on FSBS and SSI. He will be changed to moderate dose as she is receiving IV solumedrol.   Feeling of throat closing, periumbilical rash, and abdominal wall pain: Pt is getting nystatin for rash. She received benadryl for the feeling that her throat was closing up. [place] keflex has been changed to levaquin q 48 hours   Acute on chronic CKD: Baseline creatinine 1.4-1.5, serum creatinine on presentation 2.34. Creatinine now 1.43.  Monitor creatinine, electrolytes, and volume status.   Essential hypertension: Continue amlodipine.  Chronic pain syndrome: Continue gabapentin, tramadol.  CAD: Continue Plavix, statin  History of DVT: Continue apixaban  SIRS: Resolved. On 1/16, rapid response with fevers, 102.5 F, hypotensive, circumferential redness noted around the navel with abdominal pain, possible cellulitis. Patient was placed on albumin, midodrine and IV fluids.  DC IV fluids, continue midodrine 10 mg 3 times daily. BP still soft, hold metoprolol. Blood cultures negative so far, patient was placed on IV antibiotics, vanc and cefepime, narrow antibiotics to oral Levaquin. Changed from oral Keflex due to concerns over allergies.  History of fall. Patient reported to have fallen x3 at home. PT/OT has recommended SNF placement, but patient refuses and wants to go home.  CT of the head without contrast was negative for any acute intracranial process. X-ray of  bilateral femur was negative for any acute intraosseous process exceptforright knee degenerative changesandleft hip arthroplasty.  I have seen and examined this patient myself. I have spent 36 minutes in her evaluation and care.  Code Status: Full CODE STATUS DVT Prophylaxis: Apixaban Family Communication: Discussed all imaging results, lab results, explained to the patient  Disposition Plan: Discharge not safe as patient is refusing SNF and she is orthostatic.   Jessica Rakestraw, DO [facility] Direct contact: see [email or web address]  7PM-7AM contact night coverage as above 08/13/2019, 6:45 PM  LOS: 4 days

## 2019-08-13 NOTE — Plan of Care (Signed)
  Problem: Education: Goal: Knowledge of General Education information will improve Description: Including pain rating scale, medication(s)/side effects and non-pharmacologic comfort measures Outcome: Progressing   Problem: Clinical Measurements: Goal: Respiratory complications will improve Outcome: Progressing Goal: Cardiovascular complication will be avoided Outcome: Progressing   Problem: Elimination: Goal: Will not experience complications related to bowel motility Outcome: Progressing Goal: Will not experience complications related to urinary retention Outcome: Progressing   Problem: Safety: Goal: Ability to remain free from injury will improve Outcome: Progressing   Problem: Skin Integrity: Goal: Risk for impaired skin integrity will decrease Outcome: Progressing   Problem: Education: Goal: Knowledge of risk factors and measures for prevention of condition will improve Outcome: Progressing   Problem: Respiratory: Goal: Will maintain a patent airway Outcome: Progressing

## 2019-08-13 NOTE — Progress Notes (Signed)
MD paged about low BP with orthostatics.   Patient denies dizziness at this time.   BP reassessed at 1809.    Vital Signs MEWS/VS Documentation      08/13/2019 1551 08/13/2019 1554 08/13/2019 1615 08/13/2019 1809   MEWS Score:  2  3  --  0   MEWS Score Color:  Yellow  Yellow  --  Green   Resp:  --  --  --  16   Pulse:  (!) 118  (!) 118  --  91   BP:  106/81  (!) 82/44  --  123/71   Temp:  --  --  --  98.3 F (36.8 C)   O2 Device:  --  --  Room Air  Room Air   O2 Flow Rate (L/min):  --  --  --  0 L/min         Secondly, I asked attending MD about increasing Novolog sliding scale since patient was started on IV dexamethasone.     Will continue to monitor patient.       Nat Math [facility] J 08/13/2019,6:28 PM

## 2019-08-13 NOTE — Progress Notes (Signed)
PHARMACY NOTE:  ANTIMICROBIAL RENAL DOSAGE ADJUSTMENT  Current antimicrobial regimen includes a mismatch between antimicrobial dosage and estimated renal function.  As per policy approved by the Pharmacy & Therapeutics and Medical Executive Committees, the antimicrobial dosage will be adjusted accordingly.  Current antimicrobial dosage: Levaquin 749m IV qd Indication: pneumonia  Renal Function:  Estimated Creatinine Clearance: 42.4 mL/min (A) (by C-G formula based on SCr of 1.43 mg/dL (H)).      Antimicrobial dosage has been changed to: Levaquin 7571mIV q48 hours Additional comments:   Thank you for allowing pharmacy to be a part of this patient's care.  [facility]Gypsy [facility]/19/2021 3:40 PM

## 2019-08-14 DIAGNOSIS — G894 Chronic pain syndrome: Secondary | ICD-10-CM | POA: Diagnosis not present

## 2019-08-14 DIAGNOSIS — N179 Acute kidney failure, unspecified: Secondary | ICD-10-CM | POA: Diagnosis not present

## 2019-08-14 DIAGNOSIS — I25118 Atherosclerotic heart disease of native coronary artery with other forms of angina pectoris: Secondary | ICD-10-CM | POA: Diagnosis not present

## 2019-08-14 DIAGNOSIS — R197 Diarrhea, unspecified: Secondary | ICD-10-CM | POA: Diagnosis not present

## 2019-08-14 LAB — BASIC METABOLIC PANEL
Anion gap: 8 (ref 5–15)
BUN: 11 mg/dL (ref 8–23)
CO2: 23 mmol/L (ref 22–32)
Calcium: 8 mg/dL — ABNORMAL LOW (ref 8.9–10.3)
Chloride: 111 mmol/L (ref 98–111)
Creatinine, Ser: 1.4 mg/dL — ABNORMAL HIGH (ref 0.44–1.00)
GFR calc Af Amer: 45 mL/min — ABNORMAL LOW (ref >60)
GFR calc non Af Amer: 39 mL/min — ABNORMAL LOW (ref >60)
Glucose, Bld: 303 mg/dL — ABNORMAL HIGH (ref 70–99)
Potassium: 4.4 mmol/L (ref 3.5–5.1)
Sodium: 142 mmol/L (ref 135–145)

## 2019-08-14 LAB — CBC WITH DIFFERENTIAL/PLATELET
Abs Immature Granulocytes: 0.03 10*3/uL (ref 0.00–0.07)
Basophils Absolute: 0 10*3/uL (ref 0.0–0.1)
Basophils Relative: 0 %
Eosinophils Absolute: 0 10*3/uL (ref 0.0–0.5)
Eosinophils Relative: 0 %
HCT: 27.3 % — ABNORMAL LOW (ref 36.0–46.0)
Hemoglobin: 9.6 g/dL — ABNORMAL LOW (ref 12.0–15.0)
Immature Granulocytes: 1 %
Lymphocytes Relative: 19 %
Lymphs Abs: 1.2 10*3/uL (ref 0.7–4.0)
MCH: 28.5 pg (ref 26.0–34.0)
MCHC: 35.2 g/dL (ref 30.0–36.0)
MCV: 81 fL (ref 80.0–100.0)
Monocytes Absolute: 0.1 10*3/uL (ref 0.1–1.0)
Monocytes Relative: 2 %
Neutro Abs: 4.8 10*3/uL (ref 1.7–7.7)
Neutrophils Relative %: 78 %
Platelets: 222 10*3/uL (ref 150–400)
RBC: 3.37 MIL/uL — ABNORMAL LOW (ref 3.87–5.11)
RDW: 17.4 % — ABNORMAL HIGH (ref 11.5–15.5)
WBC: 6.2 10*3/uL (ref 4.0–10.5)
nRBC: 0 % (ref 0.0–0.2)

## 2019-08-14 LAB — GLUCOSE, CAPILLARY
Glucose-Capillary: 261 mg/dL — ABNORMAL HIGH (ref 70–99)
Glucose-Capillary: 282 mg/dL — ABNORMAL HIGH (ref 70–99)

## 2019-08-14 MED ORDER — LEVOFLOXACIN 750 MG PO TABS: 750.0000 mg | ORAL_TABLET | ORAL | 0 refills | Status: DC

## 2019-08-14 MED ORDER — PREDNISONE 20 MG PO TABS: ORAL_TABLET | ORAL | 0 refills | Status: DC

## 2019-08-14 MED ORDER — ROSUVASTATIN CALCIUM 20 MG PO TABS: 20.0000 mg | ORAL_TABLET | Freq: Every day | ORAL | 0 refills | Status: AC

## 2019-08-14 MED ORDER — ELIQUIS DVT/PE STARTER PACK 5 MG PO TBPK: 5.0000 mg | ORAL_TABLET | Freq: Two times a day (BID) | ORAL | Status: DC

## 2019-08-14 MED ORDER — GABAPENTIN 400 MG PO CAPS: 400.0000 mg | ORAL_CAPSULE | Freq: Three times a day (TID) | ORAL | 0 refills | Status: DC

## 2019-08-14 MED ORDER — GLUCERNA SHAKE PO LIQD: 237.0000 mL | Freq: Three times a day (TID) | ORAL | 0 refills | Status: DC

## 2019-08-14 MED ORDER — HEPARIN SOD (PORK) LOCK FLUSH 100 UNIT/ML IV SOLN
500.0000 [IU] | INTRAVENOUS | Status: AC | PRN
  Administered 2019-08-14: 500 [IU]
  Filled 2019-08-14: qty 5

## 2019-08-14 MED ORDER — TRAZODONE HCL 50 MG PO TABS: 25.0000 mg | ORAL_TABLET | Freq: Every evening | ORAL | 0 refills | Status: AC | PRN

## 2019-08-14 MED ORDER — NYSTATIN 100000 UNIT/GM EX POWD: Freq: Three times a day (TID) | CUTANEOUS | 0 refills | Status: AC

## 2019-08-14 MED ORDER — METOPROLOL TARTRATE 25 MG PO TABS: 12.5000 mg | ORAL_TABLET | Freq: Two times a day (BID) | ORAL | 2 refills | Status: AC

## 2019-08-14 NOTE — TOC Transition Note (Signed)
Transition of Care [facility]) - CM/SW Discharge Note   Patient Details  Name: [name] MRN: [id number] Date of Birth: 20-Sep-1950  [facility] CM/SW Contact:  Ross Ludwig, LCSW Phone Number: 08/14/2019, 4:10 PM   Clinical Narrative:     CSW spoke to patient, she lives alone, CSW informed her that home health agency will not be able to see her till Friday or Saturday, patient stated that is fine.  Patient also requested a bedside commode, patient did not want to wait for it, she requested that it be delivered to her house.    Patient will be going home with home health through [facility].  CSW signing off please reconsult with any other social work needs, home health agency has been notified of planned discharge.    Final next level of care: [place] Barriers to Discharge: Barriers Resolved   Patient Goals and CMS Choice Patient states their goals for this hospitalization and ongoing recovery are:: To return back home with home health services. CMS Medicare.gov Compare Post Acute Care list provided to:: Patient Choice offered to / list presented to : Patient  Discharge Placement                       Discharge Plan and Services                DME Arranged: Bedside commode DME Agency: [employer] Date DME Agency Contacted: 08/14/19 Time DME Agency Contacted: [phone] Representative spoke with at DME Agency: [place]: PT, OT [place] Agency: [facility] Date [place]: 08/14/19 Time [place]: 1450 Representative spoke with at [place]: [place]  Social Determinants of Health ([place]) Interventions     Readmission Risk Interventions Readmission Risk Prevention Plan 05/16/2019  Transportation Screening Complete  Medication Review Press photographer) Complete  HRI or [place] Complete  SW Recovery Care/Counseling Consult Complete  [place]  Not Applicable  Some recent data might be hidden

## 2019-08-14 NOTE — Progress Notes (Signed)
Physical Therapy Treatment Patient Details Name: [name] MRN: [id number] DOB: December 26, 1950 Today's Date: 08/14/2019    History of Present Illness 69 y.o. female with past medical history significant for DM2, HLD, Asthma admitted to ED on 1/14 with falls, covid PNA diagnosed originally on 12/31 with GV stay. CT abd/pelvis negative for acute abnormality.    PT Comments    Upon PT arrival, pt reports being eager to d/c home. Pt still requires min guard to min assist for mobility tasks, especially in and out of bed. PT encouraged 24/7 assist at home if pt is indeed going home, pt states "I won't have it". PT expressed concerns to pt about her decision to d/c home vs go to SNF, but pt and pt's family remain adamant about d/c home. PT educated pt on taking her time with mobility, taking rests as needed to recover dyspnea and fatigue with use of rollator, and having family with her as much as possible during mobility. Pt agrees and understands. PT asked RN to arrange for [facility] to be delivered to pt before d/c if possible.   Of note, pt reported 1 period of room spinning feeling lasting ~5-10 seconds. PT performed modified dix-hallpike in supine, negative for recreating dizziness. PT instructed pt to contact primary care if dizziness continues.    Follow Up Recommendations  SNF;Home health PT;Supervision/Assistance - 24 hour     Equipment Recommendations  3in1 (PT)    Recommendations for Other Services       Precautions / Restrictions Precautions Precautions: Fall Restrictions Weight Bearing Restrictions: No    Mobility  Bed Mobility Overal bed mobility: Needs Assistance Bed Mobility: Sit to Supine;Supine to Sit     Supine to sit: Min assist Sit to supine: Min guard   General bed mobility comments: Min assist for turnk elevation with use of HHA, pt unable to perform without PT assist and without use of handrails as will be the case at home. Min guard for return to supine for  safety.  Transfers Overall transfer level: Needs assistance Equipment used: Rolling walker (2 wheeled) Transfers: Sit to/from Stand Sit to Stand: Min guard;From elevated surface         General transfer comment: min guard for safety, verbal cuing for correct hand placement when rising to stand. Increased time to rise and steady.  Ambulation/Gait Ambulation/Gait assistance: Min guard Gait Distance (Feet): 35 Feet Assistive device: Rolling walker (2 wheeled) Gait Pattern/deviations: Step-through pattern;Decreased stride length;Trunk flexed Gait velocity: decr   General Gait Details: min guard for safety, verbal cuing for upright posture. Slow and steady gait this session, with verbal cuing for picking up RLE more during gait.   Stairs             Wheelchair Mobility    Modified Rankin (Stroke Patients Only)       Balance Overall balance assessment: Needs assistance Sitting-balance support: Feet supported Sitting balance-Leahy Scale: Fair     Standing balance support: No upper extremity supported;During functional activity Standing balance-Leahy Scale: Fair Standing balance comment: able to stand to don underwear without UE assist, no LOB but increased time                            Cognition Arousal/Alertness: Awake/alert Behavior During Therapy: WFL for tasks assessed/performed Overall Cognitive Status: Within Functional Limits for tasks assessed  Exercises      General Comments General comments (skin integrity, edema, etc.): SpO2 94-96% on RA, HRmax 134 bpm post-ambulation      Pertinent Vitals/Pain Pain Assessment: Faces Faces Pain Scale: Hurts a little bit Pain Location: generalized Pain Descriptors / Indicators: Grimacing;Discomfort Pain Intervention(s): Limited activity within patient's tolerance;Monitored during session;Repositioned    Home Living                       Prior Function            PT Goals (current goals can now be found in the care plan section) Acute Rehab PT Goals Patient Stated Goal: go to rehab to get stronger PT Goal Formulation: With patient Time For Goal Achievement: 08/23/19 Potential to Achieve Goals: Good Progress towards PT goals: Progressing toward goals    Frequency    Min 2X/week      PT Plan Current plan remains appropriate    Co-evaluation              AM-PAC PT "6 Clicks" Mobility   Outcome Measure  Help needed turning from your back to your side while in a flat bed without using bedrails?: A Little Help needed moving from lying on your back to sitting on the side of a flat bed without using bedrails?: A Little Help needed moving to and from a bed to a chair (including a wheelchair)?: A Little Help needed standing up from a chair using your arms (e.g., wheelchair or bedside chair)?: A Little Help needed to walk in hospital room?: A Little Help needed climbing 3-5 steps with a railing? : A Lot 6 Click Score: 17    End of Session Equipment Utilized During Treatment: Gait belt Activity Tolerance: Treatment limited secondary to medical complications (Comment);Patient limited by fatigue(acute dizziness) Patient left: with call bell/phone within reach;in bed;with bed alarm set Nurse Communication: Mobility status;Other (comment)(needs BSC) PT Visit Diagnosis: Other abnormalities of gait and mobility (R26.89);Muscle weakness (generalized) (M62.81)     Time: 3736-6815 PT Time Calculation (min) (ACUTE ONLY): 34 min  Charges:  $Gait Training: 8-22 mins $Therapeutic Activity: 8-22 mins                     Yeray Tomas E, PT [place] Pager [phone]  Office [phone]   Carynn Felling D Elonda Husky 08/14/2019, 3:19 PM

## 2019-08-14 NOTE — Progress Notes (Signed)
Discharge instructions given to pt and all questions were answered.  

## 2019-08-14 NOTE — Consult Note (Signed)
   [facility] Inpatient Consult   08/14/2019  [name] [birth year] [id number]   [facility] Follow up:  Spoke with [name] by telephone. HIPAA verified. Reviewed [facility] Care Management services with patient. Patient is active with community care manager for assistance with chronic disease management. Assessed for community needs. Patient states that her daughter provides transportation for her and denies need for additional help with transportation. States that she receives medications through mail and does not require assistance at this time.   States that her insurance plan had provided temporary meal delivery in the past and would like some assistance if that is available again when she transitions home. Patient agrees to continued community care manager follow up for assistance with disease management.  Will continue to follow for continued progression and disposition.  Of note, [facility] Care Management services does not replace or interfere with any services that are arranged by inpatient case management or social work.  Netta Cedars, MSN, [facility] Liaison Nurse Mobile Phone [phone]  Toll free office [phone]

## 2019-08-15 ENCOUNTER — Telehealth: Payer: Self-pay | Admitting: Family Medicine

## 2019-08-15 ENCOUNTER — Ambulatory Visit (INDEPENDENT_AMBULATORY_CARE_PROVIDER_SITE_OTHER): Payer: Medicare HMO | Admitting: Family Medicine

## 2019-08-15 ENCOUNTER — Telehealth: Payer: Self-pay | Admitting: *Deleted

## 2019-08-15 ENCOUNTER — Other Ambulatory Visit: Payer: Self-pay

## 2019-08-15 DIAGNOSIS — U071 COVID-19: Secondary | ICD-10-CM | POA: Diagnosis not present

## 2019-08-15 DIAGNOSIS — F321 Major depressive disorder, single episode, moderate: Secondary | ICD-10-CM

## 2019-08-15 DIAGNOSIS — R296 Repeated falls: Secondary | ICD-10-CM | POA: Diagnosis not present

## 2019-08-15 DIAGNOSIS — R600 Localized edema: Secondary | ICD-10-CM

## 2019-08-15 DIAGNOSIS — R69 Illness, unspecified: Secondary | ICD-10-CM | POA: Diagnosis not present

## 2019-08-15 DIAGNOSIS — R0602 Shortness of breath: Secondary | ICD-10-CM

## 2019-08-15 DIAGNOSIS — I25118 Atherosclerotic heart disease of native coronary artery with other forms of angina pectoris: Secondary | ICD-10-CM

## 2019-08-15 DIAGNOSIS — A419 Sepsis, unspecified organism: Secondary | ICD-10-CM | POA: Diagnosis not present

## 2019-08-15 DIAGNOSIS — L89609 Pressure ulcer of unspecified heel, unspecified stage: Secondary | ICD-10-CM | POA: Diagnosis not present

## 2019-08-15 DIAGNOSIS — E1165 Type 2 diabetes mellitus with hyperglycemia: Secondary | ICD-10-CM

## 2019-08-15 DIAGNOSIS — I10 Essential (primary) hypertension: Secondary | ICD-10-CM | POA: Diagnosis not present

## 2019-08-15 DIAGNOSIS — G620 Drug-induced polyneuropathy: Secondary | ICD-10-CM | POA: Diagnosis not present

## 2019-08-15 DIAGNOSIS — IMO0002 Reserved for concepts with insufficient information to code with codable children: Secondary | ICD-10-CM

## 2019-08-15 DIAGNOSIS — C160 Malignant neoplasm of cardia: Secondary | ICD-10-CM

## 2019-08-15 DIAGNOSIS — G40301 Generalized idiopathic epilepsy and epileptic syndromes, not intractable, with status epilepticus: Secondary | ICD-10-CM

## 2019-08-15 DIAGNOSIS — J45909 Unspecified asthma, uncomplicated: Secondary | ICD-10-CM | POA: Diagnosis not present

## 2019-08-15 DIAGNOSIS — E1151 Type 2 diabetes mellitus with diabetic peripheral angiopathy without gangrene: Secondary | ICD-10-CM

## 2019-08-15 LAB — CULTURE, BLOOD (ROUTINE X 2)
Culture: NO GROWTH
Culture: NO GROWTH
Special Requests: ADEQUATE
Special Requests: ADEQUATE

## 2019-08-15 NOTE — Telephone Encounter (Signed)
Dr. Jess Barters  Office  wanted patient to call so they could get an accuarate date and time. BUT all she had to do was keep listening to the automatic system and press Option 3. So I called her back and she's going to call themagain. So she should be good.

## 2019-08-15 NOTE — Telephone Encounter (Signed)
Thank you :)

## 2019-08-15 NOTE — Discharge Summary (Signed)
Physician Discharge Summary  [name] FKC:[id number] DOB: [birth year] DOA: 08/08/2019  PCP: Ann Held, DO  Admit date: 08/08/2019 Discharge date: 08/15/2019  Recommendations for Outpatient Follow-up:  1. Home health PT/OT 2. Follow up with PCP in 7-10 days. 3. COVID-19 self isolation for 10 days.  Discharge Diagnoses: Principal diagnosis is #1 1. Recent COVID-19 viral pneumonitis/with acute respiratory failure 2. Nausea/diarrhea 3. DM II 4. Acute on chronic CKD 5. Orthostatic hypotension 6. Essential hypertension 7. Chronic pain syndrome 8. CAD 9. History of DVT 10. SIRS 11. Fall 12. Generalized weakness  Discharge Condition: Fair  Disposition: Home with home health  Diet recommendation: heart healthy/carbohydrate modified  Filed Weights   08/08/19 1709  Weight: 87.1 kg    History of present illness:  [name] is a 69 y.o. female with medical history significant of .  Diabetes type 2, hyperlipidemia, asthma not on home oxygen, recent hospitalization for Covid infection presents to the emergency department chief complaint generalized weakness/fall/ loose stool loss of appetite and nausea.  She was discharged from the hospital January 4 feeling better during her time at home she has had no appetite she tries to eat and then feels nauseated.  She denies any nausea between meals.  He denies any vomiting.  She denies any abdominal pain bloating constipation.  She reports 1 mechanical fall without loss of consciousness or injury yesterday.  She reports feeling very weak not having eaten for the last 2 days.  She denies headache dizziness syncope or near syncope.  She denies chest pain palpitations shortness of breath fever chills.  She denies any dysuria hematuria frequency or urgency.  She denies cough.  Reports her stool was very soft like pudding and had a yellow color to it.  ED Course: Work-up in the emergency department reveals acute kidney injury  superimposed on chronic kidney disease stage III likely secondary to dehydration in the setting of decreased appetite and persistent nausea.  Chest x-ray reveals central catheter position unchanged without pneumothorax small right pleural effusion no edema or consolidation stable cardiac silhouette.  She complained to the emergency department about abdominal pain which she denied for me.  CT of the abdomen results are pending.  She is afebrile hemodynamically stable and nontoxic-appearing.  She does have an elevated lactic acid for which she received 2 L normal saline. WBC 14.4. urinalysis with rare bacteria, 21-50 WBC, neg leukocytes  Hospital Course:  [name] a 69 y.o.femalewithdiabetes mellitus type 2, hyperlipidemia, asthma, recent hospitalization for Covid 19 presented to ED with generalized weakness, diarrhea, fall, loss of appetite and nausea.She was discharged from the hospital January 4 feeling better during her time at home she has had no appetite she tries to eat and then feels nauseated. She reports 1 mechanical fall without loss of consciousness or injury yesterday. She reports feeling very weak not having eaten for the last 2 days. She denies headache dizziness syncope or near syncope. She denies chest pain palpitations shortness of breath fever chills. She denies any dysuria hematuria frequency or urgency.   On 08/12/2019 Gastroenterology was consulted for evaluation of abddominal pain that was periumbilical and was related to a rash in the same area. Imaging studies were unremarkable. Nystatin was used for the rash. Of note on that date the patient's antibiotics had been changed from cefepime to keflex. Today the patient complained of feeling that her throat was closing up both occurred after receiving Keflex. Her antibiotic has been changed to Levaquin  on the advice of pharmacy.  On 08/13/2019 the patient complained of lightheadedness after using the bathroom. Orthostatics  were performed and were positive. However, the patient responded to a bolus of normal saline. Orthostatics were corrected by the next day, and the patient had no further complaints of lightheadedness. She had been evaluated by PT/OT. Their recommendation was that the patient discharge to SNF for rehab. However, the patient declined this option and wished to go home with home health PT/OT instead.   Today's assessment: S: The patient is resting quietly. No new complaints. O: Vitals:  Vitals:   08/14/19 0726 08/14/19 1202  BP: 120/74 112/72  Pulse: 71 92  Resp: 16 16  Temp: 97.9 F (36.6 C) 98.2 F (36.8 C)  SpO2: 95% 100%  Constitutional:   The patient is awake, alert, and oriented x 3. No acute distress. Respiratory:   No increased work of breathing.  No wheezes, rales, or rhonchi  No tactile fremitus Cardiovascular:   Regular rate and rhythm  No murmurs, ectopy, or gallups.  No lateral PMI. No thrills. Abdomen:   Abdomen is soft, non-tender, non-distended  No hernias, masses, or organomegaly  Normoactive bowel sounds.  Musculoskeletal:   No cyanosis, clubbing, or edema Skin:   No rashes, lesions, ulcers  palpation of skin: no induration or nodules Neurologic:   CN 2-12 intact  Sensation all 4 extremities intact Psychiatric:   Mental status ? Mood, affect appropriate ? Orientation to person, place, time  judgment and insight appear intact  Discharge Instructions  Discharge Instructions    Activity as tolerated - No restrictions   Complete by: As directed    Call MD for:  difficulty breathing, headache or visual disturbances   Complete by: As directed    Call MD for:  persistant nausea and vomiting   Complete by: As directed    Call MD for:  temperature >100.4   Complete by: As directed    DME Bedside commode   Complete by: As directed    Patient needs a bedside commode to treat with the following condition: Weakness   Diet - low sodium heart  healthy   Complete by: As directed    Diet Carb Modified   Complete by: As directed    Discharge instructions   Complete by: As directed    Home with home health PT/OT. Follow up with PCP in 7-10 days COVID self isolation for for 1 week.   Increase activity slowly   Complete by: As directed      Allergies as of 08/14/2019      Reactions   Cephalexin Itching, Rash, Other (See Comments)   Sensation of throat closing   Adhesive [tape] Other (See Comments)   Burn Skin   Codeine Other (See Comments)   paranoid   Zocor [simvastatin - High Dose] Other (See Comments)   Muscle spasms   Penicillins Rash   Has patient had a PCN reaction causing immediate rash, facial/tongue/throat swelling, SOB or lightheadedness with hypotension: Yes Has patient had a PCN reaction causing severe rash involving mucus membranes or skin necrosis: No Has patient had a PCN reaction that required hospitalization does not remember.  Has patient had a PCN reaction occurring within the last 10 years: childhood If all of the above answers are "NO", then may proceed with Cephalosporin use.      Medication List    STOP taking these medications   benzonatate 100 MG capsule Commonly known as: TESSALON   famotidine 40  MG tablet Commonly known as: Pepcid   gabapentin 800 MG tablet Commonly known as: NEURONTIN Replaced by: gabapentin 400 MG capsule   Hyoscyamine Sulfate SL 0.125 MG Subl Commonly known as: Levsin/SL   nystatin 100000 UNIT/ML suspension Commonly known as: MYCOSTATIN   ondansetron 4 MG tablet Commonly known as: ZOFRAN     TAKE these medications   albuterol (2.5 MG/3ML) 0.083% nebulizer solution Commonly known as: PROVENTIL Take 3 mLs (2.5 mg total) by nebulization every 6 (six) hours as needed for wheezing or shortness of breath.   albuterol 108 (90 Base) MCG/ACT inhaler Commonly known as: VENTOLIN HFA Inhale 2 puffs into the lungs every 6 (six) hours as needed for wheezing or shortness  of breath.   B-D UF III MINI PEN NEEDLES 31G X 5 MM Misc Generic drug: Insulin Pen Needle Use to inject insulin once a day.   Centrum Silver Adult 50+ Tabs Take 1 tablet once a day   clopidogrel 75 MG tablet Commonly known as: PLAVIX Take 1 tablet (75 mg total) by mouth daily.   darifenacin 7.5 MG 24 hr tablet Commonly known as: ENABLEX Take 7.5 mg by mouth daily.   Eliquis DVT/PE Starter Pack 5 MG Tbpk Generic drug: Apixaban Starter Pack Take 5 mg by mouth 2 (two) times daily. Take as directed on package: start with two-59m tablets twice daily for 7 days. On day 8, switch to one-563mtablet twice daily.   feeding supplement (GLUCERNA SHAKE) Liqd Take 237 mLs by mouth 3 (three) times daily between meals.   gabapentin 400 MG capsule Commonly known as: NEURONTIN Take 1 capsule (400 mg total) by mouth 3 (three) times daily. Replaces: gabapentin 800 MG tablet   glucose blood test strip Commonly known as: OneTouch Verio Use as instructed to check blood sugar 3 times a day   ipratropium-albuterol 0.5-2.5 (3) MG/3ML Soln Commonly known as: DUONEB Take 3 mLs by nebulization every 6 (six) hours as needed.   isosorbide mononitrate 30 MG 24 hr tablet Commonly known as: IMDUR TAKE ONE HALF TABLET BY MOUTH EVERY DAY PATIENT NEEDS APPOINTMENT BEFORE NEXT REFILL. What changed: See the new instructions.   levofloxacin 750 MG tablet Commonly known as: LEVAQUIN Take 1 tablet (750 mg total) by mouth every other day for 6 days. Start taking on: August 16, 2019   lidocaine-prilocaine cream Commonly known as: EMLA Apply 1 application topically as needed. What changed: reasons to take this   metFORMIN 500 MG tablet Commonly known as: GLUCOPHAGE Take 1 tablet (500 mg total) by mouth 2 (two) times daily with a meal.   metoprolol tartrate 25 MG tablet Commonly known as: LOPRESSOR Take 0.5 tablets (12.5 mg total) by mouth 2 (two) times daily. What changed: how much to take     nitroGLYCERIN 0.4 MG SL tablet Commonly known as: Nitrostat Place 1 tablet (0.4 mg total) under the tongue every 5 (five) minutes as needed. What changed: reasons to take this   nystatin powder Commonly known as: MYCOSTATIN/NYSTOP Apply topically 3 (three) times daily.   pantoprazole 40 MG tablet Commonly known as: PROTONIX Take 1 tablet (40 mg total) by mouth daily.   predniSONE 20 MG tablet Commonly known as: Deltasone Take 2 tablets (40 mg total) by mouth daily for 3 days, THEN 1 tablet (20 mg total) daily for 3 days, THEN 0.5 tablets (10 mg total) daily for 3 days. Start taking on: August 14, 2019   rosuvastatin 20 MG tablet Commonly known as: CRESTOR Take 1 tablet (  20 mg total) by mouth daily at 6 PM. What changed:   medication strength  how much to take  when to take this   sertraline 50 MG tablet Commonly known as: ZOLOFT Take 1 tablet (50 mg total) by mouth daily.   sucralfate 1 g tablet Commonly known as: CARAFATE TAKE 1 TABLET BY MOUTH TWICE DAILY   traMADol 50 MG tablet Commonly known as: ULTRAM TAKE TWO TABLETS BY MOUTH THREE TIMES DAILY AS NEEDED FOR MODERATE PAIN What changed:   how much to take  how to take this  when to take this  reasons to take this  additional instructions   traZODone 50 MG tablet Commonly known as: DESYREL Take 0.5 tablets (25 mg total) by mouth at bedtime as needed for sleep. What changed: how much to take            Durable Medical Equipment  (From admission, onward)         Start     Ordered   08/14/19 0000  DME Bedside commode    Question:  Patient needs a bedside commode to treat with the following condition  Answer:  Weakness   08/14/19 1536         Allergies  Allergen Reactions   Cephalexin Itching, Rash and Other (See Comments)    Sensation of throat closing   Adhesive [Tape] Other (See Comments)    Burn Skin   Codeine Other (See Comments)    paranoid   Zocor [Simvastatin - High  Dose] Other (See Comments)    Muscle spasms   Penicillins Rash    Has patient had a PCN reaction causing immediate rash, facial/tongue/throat swelling, SOB or lightheadedness with hypotension: Yes Has patient had a PCN reaction causing severe rash involving mucus membranes or skin necrosis: No Has patient had a PCN reaction that required hospitalization does not remember.  Has patient had a PCN reaction occurring within the last 10 years: childhood If all of the above answers are "NO", then may proceed with Cephalosporin use.     The results of significant diagnostics from this hospitalization (including imaging, microbiology, ancillary and laboratory) are listed below for reference.    Significant Diagnostic Studies: CT ABDOMEN PELVIS WO CONTRAST  Result Date: 08/08/2019 CLINICAL DATA:  Nausea and vomiting. Hypotension. Weakness and dizziness. Diarrhea. COVID-19. EXAM: CT ABDOMEN AND PELVIS WITHOUT CONTRAST TECHNIQUE: Multidetector CT imaging of the abdomen and pelvis was performed following the standard protocol without IV contrast. COMPARISON:  CT scan dated 03/22/2019 FINDINGS: Lower chest: There are small patchy peripheral infiltrates at both lung bases consistent with COVID pneumonia. Heart size is normal. Coronary artery calcifications. Coronary stents. No pericardial effusion. Small hiatal hernia, chronic. Hepatobiliary: Severe progressive hepatic steatosis. Gallbladder is distended. No dilated bile ducts. Pancreas: Diffuse pancreatic atrophy, unchanged. Otherwise negative. Spleen: Normal in size without focal abnormality. Adrenals/Urinary Tract: Normal adrenal glands. Slight left renal atrophy, chronic. Compensatory hypertrophy of the right kidney. No hydronephrosis. The visualized portion of the bladder is normal. Bladder detail is obscured by metallic artifact from the right hip prosthesis. Stomach/Bowel: Small hiatal hernia. Surgical staples in the stomach, duodenum, and at the tip of  the cecum. Prior appendectomy. There are few diverticula in the sigmoid portion of the colon. No acute bowel abnormality. Vascular/Lymphatic: Aortic atherosclerosis. No enlarged abdominal or pelvic lymph nodes. Reproductive: The area of the uterus and ovaries are obscured by metallic artifact. Other: No abdominal wall hernia or abnormality. No abdominopelvic ascites. Musculoskeletal: No acute abnormalities.  IMPRESSION: 1. Small patchy peripheral infiltrates at both lung bases consistent with COVID pneumonia. 2. Severe progressive hepatic steatosis. 3. No acute abnormality of the abdomen and pelvis. 4. Aortic Atherosclerosis (ICD10-I70.0). Electronically Signed   By: Lorriane Shire M.D.   On: 08/08/2019 15:36   CT HEAD WO CONTRAST  Result Date: 08/10/2019 CLINICAL DATA:  Headache.  Suspicion of intracranial hemorrhage. EXAM: CT HEAD WITHOUT CONTRAST TECHNIQUE: Contiguous axial images were obtained from the base of the skull through the vertex without intravenous contrast. COMPARISON:  03/19/2018 FINDINGS: Brain: Mild age related atrophy. No evidence of old or acute infarction, mass lesion, hemorrhage, hydrocephalus or extra-axial collection. Vascular: There is atherosclerotic calcification of the major vessels at the base of the brain. Skull: Negative Sinuses/Orbits: Clear/normal Other: None IMPRESSION: Normal study for age.  No cause of headache identified. Electronically Signed   By: Nelson Chimes M.D.   On: 08/10/2019 21:00   US Abdomen Complete  Result Date: 08/10/2019 CLINICAL DATA:  69 year old female with abdominal pain. EXAM: ABDOMEN ULTRASOUND COMPLETE COMPARISON:  CT of the abdomen pelvis dated 08/08/2019 FINDINGS: Gallbladder: No gallstones or wall thickening visualized. No sonographic Murphy sign noted by sonographer. Common bile duct: Diameter: 3 mm Liver: There is diffuse increased liver echogenicity most commonly seen in the setting of fatty infiltration. Superimposed inflammation or fibrosis is  not excluded. Clinical correlation is recommended. Portal vein is patent on color Doppler imaging with normal direction of blood flow towards the liver. IVC: No abnormality visualized. Pancreas: Not well visualized. Spleen: Size and appearance within normal limits. Right Kidney: Length: 10 cm. Normal echogenicity. No hydronephrosis or shadowing stone. Left Kidney: Length: 9 cm. Normal echogenicity. No hydronephrosis or shadowing stone. Abdominal aorta: No aneurysm visualized. Other findings: None. IMPRESSION: Fatty liver, otherwise unremarkable abdominal ultrasound. Electronically Signed   By: Anner Crete M.D.   On: 08/10/2019 22:19   DG Chest Port 1 View  Result Date: 08/08/2019 CLINICAL DATA:  Shortness of breath EXAM: PORTABLE CHEST 1 VIEW COMPARISON:  July 29, 2019 FINDINGS: Port-A-Cath tip is in the superior vena cava. No pneumothorax. There is a small right pleural effusion. There is no edema or consolidation. Heart is upper normal in size with pulmonary vascularity normal. No evident adenopathy. There is degenerative change in the left shoulder. IMPRESSION: Central catheter position unchanged without pneumothorax. Small right pleural effusion. No edema or consolidation. Stable cardiac silhouette. No demonstrable adenopathy. Electronically Signed   By: Lowella Grip III M.D.   On: 08/08/2019 14:38   DG CHEST PORT 1 VIEW  Result Date: 07/29/2019 CLINICAL DATA:  Hypoxia EXAM: PORTABLE CHEST 1 VIEW COMPARISON:  07/27/2019 FINDINGS: The heart size and mediastinal contours are within normal limits. Right chest port catheter. Both lungs are clear. The visualized skeletal structures are unremarkable. IMPRESSION: No acute abnormality of the lungs in AP portable projection. Electronically Signed   By: Eddie Candle M.D.   On: 07/29/2019 08:10   DG CHEST PORT 1 VIEW  Result Date: 07/27/2019 CLINICAL DATA:  hypoxia EXAM: PORTABLE CHEST 1 VIEW COMPARISON:  Chest radiograph 08/24/2018 FINDINGS:  Enlargement of the cardiomediastinal contours likely secondary to AP technique. Right central venous catheter is unchanged. The lungs are clear. No pneumothorax or large pleural effusion. No acute finding in the visualized skeleton. IMPRESSION: No evidence of active disease. Electronically Signed   By: Audie Pinto M.D.   On: 07/27/2019 10:45   DG Chest Port 1 View  Result Date: 07/25/2019 CLINICAL DATA:  Fever, chills EXAM: PORTABLE  CHEST 1 VIEW COMPARISON:  05/14/2019 FINDINGS: Right Port-A-Cath in place, unchanged. Heart and mediastinal contours are within normal limits. No focal opacities or effusions. No acute bony abnormality. IMPRESSION: No active disease. Electronically Signed   By: Rolm Baptise M.D.   On: 07/25/2019 11:44   DG Abd 2 Views  Result Date: 08/10/2019 CLINICAL DATA:  COVID-19 positive, acute onset lower abdominal pain. EXAM: ABDOMEN - 2 VIEW COMPARISON:  CT abdomen pelvis 08/08/2019 FINDINGS: Few contiguous loops of borderline air distended small bowel in the mid abdomen. Large volume of stool throughout the colon. Air and stool overlying the rectal vault. No suspicious calcifications. Prior right hip arthroplasty is noted. Degenerative changes noted in the lumbar spine, pelvis and left hip. IMPRESSION: Few contiguous loops of increasingly air distended small bowel in the mid abdomen could reflect ileus or early obstruction. Electronically Signed   By: Lovena Le M.D.   On: 08/10/2019 17:55   DG FEMUR 1V LEFT  Result Date: 08/10/2019 CLINICAL DATA:  Pain status post fall EXAM: LEFT FEMUR 1 VIEW COMPARISON:  None. 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 There is no acute displaced fracture. No dislocation. Evaluation is limited by single view technique. Vascular calcifications are noted. IMPRESSION: No definite acute displaced fracture. Electronically Signed   By: Constance Holster M.D.   On: 08/10/2019 19:36   DG FEMUR 1V RIGHT  Result  Date: 08/10/2019 CLINICAL DATA:  Pain status post fall. EXAM: RIGHT FEMUR 1 VIEW COMPARISON:  None. FINDINGS: The patient is status post total hip arthroplasty on the right. Vascular calcifications are noted. There is no displaced fracture. No dislocation. There are few subtle lucencies coursing through the greater trochanter. There are multicompartmental degenerative changes of the right knee. Osteopenia is noted. IMPRESSION: 1. Subtle lucencies are noted coursing through the greater trochanter. If there is clinical concern for a fracture at this level, consider dedicated right hip radiographs for further evaluation. 2. Status post total hip arthroplasty. 3. Multicompartmental degenerative changes are noted of the knee. Electronically Signed   By: Constance Holster M.D.   On: 08/10/2019 19:40   VAS [place] LOWER EXTREMITY VENOUS (DVT)  Result Date: 07/28/2019  Lower Venous Study Indications: Elevated Ddimer.  Risk Factors: COVID 19 positive. Limitations: Body habitus and poor ultrasound/tissue interface. Comparison Study: No prior studies. Performing Technologist: Oliver Hum RVT  Examination Guidelines: A complete evaluation includes B-mode imaging, spectral Doppler, color Doppler, and power Doppler as needed of all accessible portions of each vessel. Bilateral testing is considered an integral part of a complete examination. Limited examinations for reoccurring indications may be performed as noted.  +---------+---------------+---------+-----------+----------+--------------+  RIGHT     Compressibility Phasicity Spontaneity Properties Thrombus Aging  +---------+---------------+---------+-----------+----------+--------------+  CFV       Full            Yes       Yes                                    +---------+---------------+---------+-----------+----------+--------------+  SFJ       Full                                                              +---------+---------------+---------+-----------+----------+--------------+  FV Prox   Full                                                             +---------+---------------+---------+-----------+----------+--------------+  FV Mid    Full                                                             +---------+---------------+---------+-----------+----------+--------------+  FV Distal Full                                                             +---------+---------------+---------+-----------+----------+--------------+  PFV       Full                                                             +---------+---------------+---------+-----------+----------+--------------+  POP       Full            Yes       Yes                                    +---------+---------------+---------+-----------+----------+--------------+  PTV       Full                                                             +---------+---------------+---------+-----------+----------+--------------+  PERO      Partial                                          Acute           +---------+---------------+---------+-----------+----------+--------------+   +---------+---------------+---------+-----------+----------+--------------+  LEFT      Compressibility Phasicity Spontaneity Properties Thrombus Aging  +---------+---------------+---------+-----------+----------+--------------+  CFV       Full            Yes       Yes                                    +---------+---------------+---------+-----------+----------+--------------+  SFJ       Full                                                             +---------+---------------+---------+-----------+----------+--------------+  FV Prox   Full                                                             +---------+---------------+---------+-----------+----------+--------------+  FV Mid    Full                                                              +---------+---------------+---------+-----------+----------+--------------+  FV Distal Full                                                             +---------+---------------+---------+-----------+----------+--------------+  PFV       Full                                                             +---------+---------------+---------+-----------+----------+--------------+  POP       Full            Yes       Yes                                    +---------+---------------+---------+-----------+----------+--------------+  PTV       Full                                                             +---------+---------------+---------+-----------+----------+--------------+  PERO      Full                                                             +---------+---------------+---------+-----------+----------+--------------+     Summary: Right: Findings consistent with acute deep vein thrombosis involving the right peroneal veins. No cystic structure found in the popliteal fossa. Left: There is no evidence of deep vein thrombosis in the lower extremity. No cystic structure found in the popliteal fossa.  *See table(s) above for measurements and observations. Electronically signed by Deitra Mayo MD on 07/28/2019 at 5:05:04 PM.    Final     Microbiology: Recent Results (from the past 240 hour(s))  GI pathogen panel by PCR, stool     Status: Abnormal   Collection Time: 08/10/19  8:03 AM   Specimen: STOOL  Result Value Ref Range Status   Plesiomonas shigelloides NOT DETECTED NOT DETECTED Final   Yersinia enterocolitica NOT  DETECTED NOT DETECTED Final   Vibrio NOT DETECTED NOT DETECTED Final   Enteropathogenic E coli DETECTED (A) NOT DETECTED Final   E coli (ETEC) LT/ST NOT DETECTED NOT DETECTED Final   E coli 3536 by PCR Not applicable NOT DETECTED Final   Cryptosporidium by PCR NOT DETECTED NOT DETECTED Final   Entamoeba histolytica NOT DETECTED NOT DETECTED Final   Adenovirus F 40/41 NOT DETECTED  NOT DETECTED Final   Norovirus GI/GII NOT DETECTED NOT DETECTED Final   Sapovirus NOT DETECTED NOT DETECTED Final    Comment: (NOTE) Performed At: [facility] [place], [place] [id number] Rush Farmer MD QQ:[id number]    Vibrio cholerae NOT DETECTED NOT DETECTED Final   Campylobacter by PCR NOT DETECTED NOT DETECTED Final   Salmonella by PCR NOT DETECTED NOT DETECTED Final   E coli (STEC) NOT DETECTED NOT DETECTED Final   Enteroaggregative E coli NOT DETECTED NOT DETECTED Final   Shigella by PCR NOT DETECTED NOT DETECTED Final   Cyclospora cayetanensis NOT DETECTED NOT DETECTED Final   Astrovirus NOT DETECTED NOT DETECTED Final   G lamblia by PCR NOT DETECTED NOT DETECTED Final   Rotavirus A by PCR NOT DETECTED NOT DETECTED Final  Culture, blood (routine x 2)     Status: None   Collection Time: 08/10/19  4:41 PM   Specimen: BLOOD  Result Value Ref Range Status   Specimen Description   Final    BLOOD RIGHT ANTECUBITAL Performed at [facility], [place] [address] [place], [place] [address]    Special Requests   Final    BOTTLES DRAWN AEROBIC AND ANAEROBIC Blood Culture adequate volume Performed at [facility] [address] [place], [place] [address]    Culture   Final    NO GROWTH 5 DAYS Performed at [facility], [place] [address], [place], [place] [address]    Report Status 08/15/2019 FINAL  Final  Culture, blood (routine x 2)     Status: None   Collection Time: 08/10/19  4:41 PM   Specimen: BLOOD  Result Value Ref Range Status   Specimen Description   Final    BLOOD RIGHT WRIST Performed at [facility] [place] [place] [address], [place], [place] [address]    Special Requests   Final    BOTTLES DRAWN AEROBIC ONLY Blood Culture adequate volume Performed at [place] [address] [place], [place] [address]    Culture   Final    NO GROWTH 5 DAYS Performed at [facility], [place] [address], [place], [place] [address]    Report Status 08/15/2019 FINAL  Final     Labs: Basic Metabolic Panel: Recent Labs  Lab 08/08/19 1400 08/08/19 1655 08/09/19 0108 08/10/19 1130 08/11/19 0500 08/12/19 0341 08/14/19 0500  NA   < >  --  141 142 143 143 142  K   < >  --  3.2* 3.5 3.4* 3.5 4.4  CL   < >  --  111 114* 114* 114* 111  CO2   < >  --  20* 19* 21* 23 23  GLUCOSE   < >  --  153* 222* 196* 144* 303*  BUN   < >  --  22 15 14 14 11   CREATININE   < > 2.18* 1.91* 1.62* 1.55* 1.43* 1.40*  CALCIUM   < >  --  7.0* 7.3* 7.4* 7.7* 8.0*  MG  --  1.7  --  1.5*  --  1.6*  --   PHOS  --   --   --  2.7  --  2.5  --    < > = values in this interval not displayed.   Liver Function Tests: Recent Labs  Lab 08/08/19 1400 08/10/19 1130 08/11/19 0500 08/12/19 0341  AST 176* 167* 97* 70*  ALT 68* 88* 79* 69*  ALKPHOS 122 162* 134* 143*  BILITOT 1.2 1.0 0.8 1.5*  PROT 5.3* 5.0* 4.4* 4.3*  ALBUMIN 2.0* 1.8* 1.7* 1.9*   Recent Labs  Lab 08/08/19 1400  LIPASE 11   No results for input(s): AMMONIA in the last 168 hours. CBC: Recent Labs  Lab 08/09/19 0108 08/10/19 1130 08/11/19 0500 08/12/19 0341 08/14/19 0500  WBC 10.9* 8.8 10.7* 7.4 6.2  NEUTROABS  --   --   --   --  4.8  HGB 9.6* 10.6* 9.7* 9.2* 9.6*  HCT 27.5* 29.6* 26.8* 26.4* 27.3*  MCV 82.3 80.7 80.5 81.0 81.0  PLT 311 322 278 261 222   Cardiac Enzymes: No results for input(s): CKTOTAL, CKMB, CKMBINDEX, TROPONINI in the last 168 hours. BNP: BNP (last 3 results) No results for input(s): BNP in the last 8760 hours.  ProBNP (last 3 results) No results for input(s): PROBNP in the last 8760 hours.  CBG: Recent Labs  Lab 08/13/19 1158 08/13/19 1644 08/13/19 2138 08/14/19 0740 08/14/19 1202  GLUCAP 137* 168* 158* 261* 282*    Active Problems:   Nausea   Type 2 diabetes with nephropathy (HCC)   Chronic pain syndrome   Diarrhea   Acute kidney injury superimposed on chronic kidney  disease (HCC)   Weakness   CAD (coronary artery disease)   Fall   Hypertension   Time coordinating discharge: 38 minutes.  Signed:        Carle Fenech, DO [facility]  08/15/2019, 7:58 AM

## 2019-08-15 NOTE — Telephone Encounter (Signed)
Unable to reach pt. Pt does have hospital follow up scheduled w/ PCP today @220 .

## 2019-08-16 ENCOUNTER — Encounter: Payer: Self-pay | Admitting: Family Medicine

## 2019-08-16 ENCOUNTER — Encounter (HOSPITAL_BASED_OUTPATIENT_CLINIC_OR_DEPARTMENT_OTHER): Payer: Self-pay | Admitting: *Deleted

## 2019-08-16 ENCOUNTER — Ambulatory Visit (INDEPENDENT_AMBULATORY_CARE_PROVIDER_SITE_OTHER): Payer: Medicare HMO | Admitting: Family Medicine

## 2019-08-16 ENCOUNTER — Telehealth: Payer: Self-pay

## 2019-08-16 ENCOUNTER — Inpatient Hospital Stay (HOSPITAL_BASED_OUTPATIENT_CLINIC_OR_DEPARTMENT_OTHER)
Admission: EM | Admit: 2019-08-16 | Discharge: 2019-08-23 | DRG: 683 | Disposition: A | Discharge status: Skilled Nursing Facility | Payer: Medicare HMO | Attending: Internal Medicine | Admitting: Internal Medicine

## 2019-08-16 ENCOUNTER — Other Ambulatory Visit: Payer: Self-pay

## 2019-08-16 ENCOUNTER — Emergency Department (HOSPITAL_BASED_OUTPATIENT_CLINIC_OR_DEPARTMENT_OTHER): Payer: Medicare HMO

## 2019-08-16 DIAGNOSIS — Z801 Family history of malignant neoplasm of trachea, bronchus and lung: Secondary | ICD-10-CM

## 2019-08-16 DIAGNOSIS — F329 Major depressive disorder, single episode, unspecified: Secondary | ICD-10-CM | POA: Diagnosis present

## 2019-08-16 DIAGNOSIS — Z85028 Personal history of other malignant neoplasm of stomach: Secondary | ICD-10-CM

## 2019-08-16 DIAGNOSIS — K760 Fatty (change of) liver, not elsewhere classified: Secondary | ICD-10-CM | POA: Diagnosis present

## 2019-08-16 DIAGNOSIS — N179 Acute kidney failure, unspecified: Secondary | ICD-10-CM | POA: Diagnosis not present

## 2019-08-16 DIAGNOSIS — U071 COVID-19: Secondary | ICD-10-CM | POA: Diagnosis not present

## 2019-08-16 DIAGNOSIS — Z91048 Other nonmedicinal substance allergy status: Secondary | ICD-10-CM

## 2019-08-16 DIAGNOSIS — D649 Anemia, unspecified: Secondary | ICD-10-CM | POA: Diagnosis present

## 2019-08-16 DIAGNOSIS — M7989 Other specified soft tissue disorders: Secondary | ICD-10-CM

## 2019-08-16 DIAGNOSIS — J4540 Moderate persistent asthma, uncomplicated: Secondary | ICD-10-CM | POA: Diagnosis present

## 2019-08-16 DIAGNOSIS — N183 Chronic kidney disease, stage 3 unspecified: Secondary | ICD-10-CM | POA: Diagnosis present

## 2019-08-16 DIAGNOSIS — Z820 Family history of epilepsy and other diseases of the nervous system: Secondary | ICD-10-CM

## 2019-08-16 DIAGNOSIS — I82451 Acute embolism and thrombosis of right peroneal vein: Secondary | ICD-10-CM | POA: Diagnosis present

## 2019-08-16 DIAGNOSIS — Z888 Allergy status to other drugs, medicaments and biological substances status: Secondary | ICD-10-CM

## 2019-08-16 DIAGNOSIS — Z87891 Personal history of nicotine dependence: Secondary | ICD-10-CM

## 2019-08-16 DIAGNOSIS — Z6830 Body mass index (BMI) 30.0-30.9, adult: Secondary | ICD-10-CM

## 2019-08-16 DIAGNOSIS — Z881 Allergy status to other antibiotic agents status: Secondary | ICD-10-CM

## 2019-08-16 DIAGNOSIS — R0602 Shortness of breath: Secondary | ICD-10-CM

## 2019-08-16 DIAGNOSIS — G4733 Obstructive sleep apnea (adult) (pediatric): Secondary | ICD-10-CM | POA: Diagnosis present

## 2019-08-16 DIAGNOSIS — I129 Hypertensive chronic kidney disease with stage 1 through stage 4 chronic kidney disease, or unspecified chronic kidney disease: Secondary | ICD-10-CM | POA: Diagnosis present

## 2019-08-16 DIAGNOSIS — R296 Repeated falls: Secondary | ICD-10-CM | POA: Diagnosis present

## 2019-08-16 DIAGNOSIS — B948 Sequelae of other specified infectious and parasitic diseases: Secondary | ICD-10-CM

## 2019-08-16 DIAGNOSIS — R600 Localized edema: Secondary | ICD-10-CM

## 2019-08-16 DIAGNOSIS — K219 Gastro-esophageal reflux disease without esophagitis: Secondary | ICD-10-CM | POA: Diagnosis present

## 2019-08-16 DIAGNOSIS — Z8619 Personal history of other infectious and parasitic diseases: Secondary | ICD-10-CM

## 2019-08-16 DIAGNOSIS — Z880 Allergy status to penicillin: Secondary | ICD-10-CM

## 2019-08-16 DIAGNOSIS — I2510 Atherosclerotic heart disease of native coronary artery without angina pectoris: Secondary | ICD-10-CM | POA: Diagnosis present

## 2019-08-16 DIAGNOSIS — D573 Sickle-cell trait: Secondary | ICD-10-CM | POA: Diagnosis present

## 2019-08-16 DIAGNOSIS — I959 Hypotension, unspecified: Secondary | ICD-10-CM | POA: Diagnosis not present

## 2019-08-16 DIAGNOSIS — Z833 Family history of diabetes mellitus: Secondary | ICD-10-CM

## 2019-08-16 DIAGNOSIS — I252 Old myocardial infarction: Secondary | ICD-10-CM

## 2019-08-16 DIAGNOSIS — R21 Rash and other nonspecific skin eruption: Secondary | ICD-10-CM | POA: Diagnosis present

## 2019-08-16 DIAGNOSIS — Z794 Long term (current) use of insulin: Secondary | ICD-10-CM

## 2019-08-16 DIAGNOSIS — Z9221 Personal history of antineoplastic chemotherapy: Secondary | ICD-10-CM

## 2019-08-16 DIAGNOSIS — F419 Anxiety disorder, unspecified: Secondary | ICD-10-CM | POA: Diagnosis present

## 2019-08-16 DIAGNOSIS — E860 Dehydration: Secondary | ICD-10-CM | POA: Diagnosis present

## 2019-08-16 DIAGNOSIS — R627 Adult failure to thrive: Secondary | ICD-10-CM | POA: Diagnosis present

## 2019-08-16 DIAGNOSIS — G894 Chronic pain syndrome: Secondary | ICD-10-CM | POA: Diagnosis present

## 2019-08-16 DIAGNOSIS — Z79891 Long term (current) use of opiate analgesic: Secondary | ICD-10-CM

## 2019-08-16 DIAGNOSIS — Z79899 Other long term (current) drug therapy: Secondary | ICD-10-CM

## 2019-08-16 DIAGNOSIS — Z955 Presence of coronary angioplasty implant and graft: Secondary | ICD-10-CM

## 2019-08-16 DIAGNOSIS — Z9049 Acquired absence of other specified parts of digestive tract: Secondary | ICD-10-CM

## 2019-08-16 DIAGNOSIS — N1832 Chronic kidney disease, stage 3b: Secondary | ICD-10-CM | POA: Diagnosis present

## 2019-08-16 DIAGNOSIS — Z96652 Presence of left artificial knee joint: Secondary | ICD-10-CM | POA: Diagnosis present

## 2019-08-16 DIAGNOSIS — E669 Obesity, unspecified: Secondary | ICD-10-CM | POA: Diagnosis present

## 2019-08-16 DIAGNOSIS — Z923 Personal history of irradiation: Secondary | ICD-10-CM

## 2019-08-16 DIAGNOSIS — I951 Orthostatic hypotension: Secondary | ICD-10-CM | POA: Diagnosis present

## 2019-08-16 DIAGNOSIS — Z96641 Presence of right artificial hip joint: Secondary | ICD-10-CM | POA: Diagnosis present

## 2019-08-16 DIAGNOSIS — Z7902 Long term (current) use of antithrombotics/antiplatelets: Secondary | ICD-10-CM

## 2019-08-16 DIAGNOSIS — E1122 Type 2 diabetes mellitus with diabetic chronic kidney disease: Secondary | ICD-10-CM | POA: Diagnosis present

## 2019-08-16 DIAGNOSIS — Z9181 History of falling: Secondary | ICD-10-CM

## 2019-08-16 DIAGNOSIS — Z8673 Personal history of transient ischemic attack (TIA), and cerebral infarction without residual deficits: Secondary | ICD-10-CM

## 2019-08-16 DIAGNOSIS — R0902 Hypoxemia: Secondary | ICD-10-CM | POA: Diagnosis not present

## 2019-08-16 DIAGNOSIS — E785 Hyperlipidemia, unspecified: Secondary | ICD-10-CM | POA: Diagnosis present

## 2019-08-16 DIAGNOSIS — Z885 Allergy status to narcotic agent status: Secondary | ICD-10-CM

## 2019-08-16 DIAGNOSIS — E1142 Type 2 diabetes mellitus with diabetic polyneuropathy: Secondary | ICD-10-CM | POA: Diagnosis present

## 2019-08-16 LAB — CBC WITH DIFFERENTIAL/PLATELET
Abs Immature Granulocytes: 0.1 10*3/uL — ABNORMAL HIGH (ref 0.00–0.07)
Basophils Absolute: 0 10*3/uL (ref 0.0–0.1)
Basophils Relative: 0 %
Eosinophils Absolute: 0.2 10*3/uL (ref 0.0–0.5)
Eosinophils Relative: 2 %
HCT: 26.9 % — ABNORMAL LOW (ref 36.0–46.0)
Hemoglobin: 9.3 g/dL — ABNORMAL LOW (ref 12.0–15.0)
Immature Granulocytes: 1 %
Lymphocytes Relative: 31 %
Lymphs Abs: 3.3 10*3/uL (ref 0.7–4.0)
MCH: 28.7 pg (ref 26.0–34.0)
MCHC: 34.6 g/dL (ref 30.0–36.0)
MCV: 83 fL (ref 80.0–100.0)
Monocytes Absolute: 1.1 10*3/uL — ABNORMAL HIGH (ref 0.1–1.0)
Monocytes Relative: 10 %
Neutro Abs: 5.8 10*3/uL (ref 1.7–7.7)
Neutrophils Relative %: 56 %
Platelets: 160 10*3/uL (ref 150–400)
RBC: 3.24 MIL/uL — ABNORMAL LOW (ref 3.87–5.11)
RDW: 17 % — ABNORMAL HIGH (ref 11.5–15.5)
WBC: 10.4 10*3/uL (ref 4.0–10.5)
nRBC: 0.2 % (ref 0.0–0.2)

## 2019-08-16 LAB — COMPREHENSIVE METABOLIC PANEL
ALT: 55 U/L — ABNORMAL HIGH (ref 0–44)
AST: 30 U/L (ref 15–41)
Albumin: 2.1 g/dL — ABNORMAL LOW (ref 3.5–5.0)
Alkaline Phosphatase: 211 U/L — ABNORMAL HIGH (ref 38–126)
Anion gap: 5 (ref 5–15)
BUN: 19 mg/dL (ref 8–23)
CO2: 26 mmol/L (ref 22–32)
Calcium: 8.3 mg/dL — ABNORMAL LOW (ref 8.9–10.3)
Chloride: 112 mmol/L — ABNORMAL HIGH (ref 98–111)
Creatinine, Ser: 1.89 mg/dL — ABNORMAL HIGH (ref 0.44–1.00)
GFR calc Af Amer: 31 mL/min — ABNORMAL LOW (ref >60)
GFR calc non Af Amer: 27 mL/min — ABNORMAL LOW (ref >60)
Glucose, Bld: 160 mg/dL — ABNORMAL HIGH (ref 70–99)
Potassium: 4.1 mmol/L (ref 3.5–5.1)
Sodium: 143 mmol/L (ref 135–145)
Total Bilirubin: 0.9 mg/dL (ref 0.3–1.2)
Total Protein: 5.4 g/dL — ABNORMAL LOW (ref 6.5–8.1)

## 2019-08-16 LAB — TROPONIN I (HIGH SENSITIVITY)
Troponin I (High Sensitivity): 15 ng/L (ref <18)
Troponin I (High Sensitivity): 14 ng/L (ref <18)

## 2019-08-16 LAB — BRAIN NATRIURETIC PEPTIDE: B Natriuretic Peptide: 76.9 pg/mL (ref 0.0–100.0)

## 2019-08-16 MED ORDER — SODIUM CHLORIDE 0.9 % IV BOLUS
500.0000 mL | Freq: Once | INTRAVENOUS | Status: AC
  Administered 2019-08-16: 500 mL via INTRAVENOUS

## 2019-08-16 NOTE — ED Notes (Signed)
ED Provider at bedside. 

## 2019-08-16 NOTE — ED Notes (Signed)
Pt on monitor 

## 2019-08-16 NOTE — Progress Notes (Signed)
Virtual Visit via Video Note  I connected with [name] on 08/16/19 at  3:10 PM EST by a video enabled telemedicine application and verified that I am speaking with the correct person using two identifiers.  Location: Patient: home  Provider: office    I discussed the limitations of evaluation and management by telemedicine and the availability of in person appointments. The patient expressed understanding and agreed to proceed.  History of Present Illness: Pt is home c/o swollen legs and feet  R>L  Feet to thighs swollen since yesterday  Pt also c/o leg pain and sob.  She has not been able to get up out of bed today due to pain and sob.    Observations/Objective: There were no vitals filed for this visit. She is unable to get any vitals  Pt is uncomfortable in bed  Assessment and Plan: 1. Lower extremity edema Elevate extremities  Pt will have her daughter bring her to ER due to sob and swelling over night    2. SOB (shortness of breath) Pt unable to come in due to transportation and unable to get vital signs Due to sob and leg swelling and pain pt will go to ER when her daughter gets home She was instructed to call 911 if symptoms worsen before she gets home      Follow Up Instructions:    I discussed the assessment and treatment plan with the patient. The patient was provided an opportunity to ask questions and all were answered. The patient agreed with the plan and demonstrated an understanding of the instructions.   The patient was advised to call back or seek an in-person evaluation if the symptoms worsen or if the condition fails to improve as anticipated.  I provided 20 minutes of non-face-to-face time during this encounter.   Ann Held, DO

## 2019-08-16 NOTE — Assessment & Plan Note (Signed)
Pt unable to come in due to transportation and unable to get vital signs Due to sob and leg swelling and pain pt will go to ER when her daughter gets home She was instructed to call 911 if symptoms worsen before she gets home

## 2019-08-16 NOTE — ED Notes (Signed)
Pt sat @ 100% on room air.

## 2019-08-16 NOTE — ED Notes (Signed)
XRAY in room.

## 2019-08-16 NOTE — Assessment & Plan Note (Signed)
Elevate extremities  Pt will have her daughter bring her to ER due to sob and swelling over night

## 2019-08-16 NOTE — ED Provider Notes (Signed)
[facility] EMERGENCY DEPARTMENT Provider Note   CSN: [id number] Arrival date & time: 08/16/19  1759     History Chief Complaint  Patient presents with  . Leg Swelling  . Shortness of Breath    [name] is a 69 y.o. female with recent hospitalization for COVID-19 at in December, acute on chronic kidney disease, DVT, recurrent hospitalization for abdominal pain in January (discharged 1/20), hypertension, hyperlipidemia, diabetes, presented to the emergency department with shortness of breath and leg swelling.  The patient was discharged 2 days ago from the hospital, at that time they recommended an SNF or rehab, but the patient had refused and wished to go home instead.  She lives by herself.  She returns to the emergency department complaining of bilateral leg swelling and shortness of breath.  She denies orthopnea.  She feels like the swelling in her leg is gone to the point where she cannot get around the house.  She called her primary care physician who recommended that she come to the emergency department.  Last cardiac cath on 2/5 showing prox RCA DES widely patent, balloon angioplasty and new DES placed in mid-left circumflex artery  Patient on Eliquis at home 5 mg BID per discharge instructions; says she is compliant.  Also on plavix 75 mg daily  Last echo on 08/28/2018, impression; IMPRESSIONS    1. The left ventricle has low normal systolic function of 25-05%. The cavity size is normal. There is severely increased left ventricular wall thickness of the septal wall. Normal left ventricular filling pressures. The left ventricular diastology could not be evaluated due to indeterminent diastolic function. There is severe akinesis of the basiliar-mid inferior left ventricular segment.  2. There is mild dilatation of the ascending aorta.  3. Ao Asc diam:3.75 cm.  4. The tricuspid valve is normal in structure. Regurgitation is mild by color flow Doppler.  5. The mitral  valve is normal in structure Regurgitation is trivial by color flow Doppler.  6. Compared to prior echo, there is now mid and basal inferior akinesis. The endocardial segments are not well visualized and Definity contrast would be helpful to delineate if this is a new wall motion abnormality.  HPI     Past Medical History:  Diagnosis Date  . Asthma   . Degenerative joint disease   . Diabetes mellitus   . Dyspnea   . GERD (gastroesophageal reflux disease)   . Hyperlipidemia    no longer on medication for this  . Hypertension    patient denies  . Myocardial infarction ([place]) 08/2018  . Neuropathy    bilateral hands and feet  . Sickle cell trait ([place])   . Sleep apnea   . stomach ca dx'd 2010   chemo/xrt comp 05/2009  . TIA (transient ischemic attack) 07/2015    Patient Active Problem List   Diagnosis Date Noted  . FTT (failure to thrive) in adult 08/17/2019  . SOB (shortness of breath) 08/16/2019  . Lower extremity edema 08/16/2019  . Fall 08/08/2019  . Hypertension 08/08/2019  . COVID-19 virus infection 07/25/2019  . Elevated LFTs 07/25/2019  . COVID-19 07/25/2019  . Allergic contact dermatitis due to adhesives 05/23/2019  . Insomnia 05/23/2019  . SIRS (systemic inflammatory response syndrome) ([place]) 05/14/2019  . NSTEMI (non-ST elevated myocardial infarction) ([place]) 05/14/2019  . Hyperglycemia   . Neutropenia ([place])   . CAD (coronary artery disease)   . Obesity with serious comorbidity 04/16/2019  . Gastroenteritis 03/27/2019  . Falls  frequently 03/27/2019  . Acute gastroenteritis 03/22/2019  . Hematoma of scalp 03/14/2019  . Vitamin D deficiency 11/09/2018  . Muscle spasm 10/19/2018  . Chest pain 09/07/2018  . Palpitations 09/07/2018  . Other fatigue 09/07/2018  . Acute ST elevation myocardial infarction (STEMI) of inferior wall ([place]) 08/27/2018    Class: Hospitalized for  . Coronary artery disease involving native coronary artery of native heart with unstable  angina pectoris ([place]) 08/27/2018    Class: Diagnosis of  . Acute vaginitis 08/20/2018  . Hyperlipidemia associated with type 2 diabetes mellitus ([place]) 04/09/2018  . Bronchitis 11/02/2017  . Sepsis ([place]) 11/02/2017  . Leucocytosis 11/02/2017  . Moderate persistent asthma with acute exacerbation 05/26/2017  . Influenza A 05/26/2017  . Acute pain of right shoulder 03/07/2017  . Acute bilateral low back pain without sciatica 03/07/2017  . Chronic pain 03/07/2017  . Fatty liver 03/24/2016  . Weakness 03/23/2016  . Unexplained weight loss 03/23/2016  . Melena 03/23/2016  . Hypotension 02/03/2016  . Syncope 02/03/2016  . Hypomagnesemia 02/03/2016  . Acute kidney injury superimposed on chronic kidney disease ([place]) 02/03/2016  . Gastrointestinal dysmotility 01/20/2016  . CKD (chronic kidney disease) stage 3, GFR 30-59 ml/min (H[facility]) 01/20/2016  . Abscess of right thigh 01/20/2016  . Chronic pain syndrome 01/15/2016  . Diarrhea 01/15/2016  . Generalized abdominal pain 01/14/2016  . Type 2 diabetes with nephropathy ([place]) 12/30/2015  . TIA (transient ischemic attack) 07/2015  . Morbid obesity due to excess calories ([place]) 05/05/2015  . Diabetic polyneuropathy associated with type 2 diabetes mellitus ([place]) 01/02/2015  . Acute bacterial sinusitis 01/02/2015  . Onychomycosis 11/04/2014  . Pain in lower limb 11/04/2014  . Multinodular goiter (nontoxic) 10/16/2014  . History of CVA (cerebrovascular accident) 08/11/2014  . Iron deficiency anemia 05/20/2014  . Major depressive disorder, single episode, moderate ([place]) 05/20/2014  . Dizziness of unknown cause 04/28/2014  . MDD (major depressive disorder), recurrent episode, moderate ([place]) 01/28/2014  . GERD (gastroesophageal reflux disease) 01/02/2014  . Nausea 10/15/2012  . Stomach cancer ([place]) 12/10/2008  . CHRONIC RESPIRATORY FAILURE 01/03/2008  . Hyperlipidemia 06/27/2007  . OBESITY, MORBID 06/27/2007  . SLEEP APNEA, OBSTRUCTIVE 06/27/2007    . Generalized idiopathic epilepsy and epileptic syndromes, not intractable, with status epilepticus ([place]) 06/27/2007  . Asthma 06/27/2007  . OSTEOARTHRITIS  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 06/27/2007    Past Surgical History:  Procedure Laterality Date  . APPENDECTOMY    . CESAREAN SECTION    . COLON SURGERY     colonscopy  . COLONOSCOPY WITH PROPOFOL N/A 06/01/2016   Procedure: COLONOSCOPY WITH PROPOFOL;  Surgeon: Wilford Corner, MD;  Location: [facility] ENDOSCOPY;  Service: Endoscopy;  Laterality: N/A;  . CORONARY STENT INTERVENTION N/A 08/29/2018   Procedure: CORONARY STENT INTERVENTION;  Surgeon: Wellington Hampshire, MD;  Location: [facility] CV LAB;  Service: Cardiovascular;  Laterality: N/A;  . CORONARY/GRAFT ACUTE MI REVASCULARIZATION N/A 08/27/2018   Procedure: Coronary/Graft Acute MI Revascularization;  Surgeon: Leonie Man, MD;  Location: [facility] LAB;  Service: Cardiovascular;  Laterality: N/A;  . ESOPHAGOGASTRODUODENOSCOPY N/A 10/15/2012   Procedure: ESOPHAGOGASTRODUODENOSCOPY (EGD);  Surgeon: Lear Ng, MD;  Location: [facility] ENDOSCOPY;  Service: Endoscopy;  Laterality: N/A;  . ESOPHAGOGASTRODUODENOSCOPY N/A 01/15/2016   Procedure: ESOPHAGOGASTRODUODENOSCOPY (EGD);  Surgeon: Ronald Lobo, MD;  Location: [facility] ENDOSCOPY;  Service: Endoscopy;  Laterality: N/A;  . HERNIA REPAIR    . LEFT HEART CATH AND CORONARY ANGIOGRAPHY N/A 08/27/2018   Procedure: LEFT  HEART CATH AND CORONARY ANGIOGRAPHY;  Surgeon: Leonie Man, MD;  Location: [facility] LAB;  Service: Cardiovascular;  Laterality: N/A;  . SHOULDER SURGERY     Left  . TOTAL HIP ARTHROPLASTY     Right  . TOTAL KNEE ARTHROPLASTY     Left     OB History    Gravida  4   Para  4   Term      Preterm      AB      Living  4     SAB      TAB      Ectopic      Multiple      Live Births              Family History  Problem Relation Age of Onset  . Diabetes Brother   . Alzheimer's disease Father  52  . Cancer Mother 68  . Diabetes Maternal Grandmother   . Cancer Maternal Grandfather        lung  . Healthy Child   . Suicidality Neg Hx   . Depression Neg Hx   . Dementia Neg Hx   . Anxiety disorder Neg Hx     Social History   Tobacco Use  . Smoking status: Former Smoker    Packs/day: 1.00    Years: 15.00    Pack years: 15.00    Types: Cigarettes    Start date: 09/27/1973    Quit date: 10/15/1988    Years since quitting: 30.8  . Smokeless tobacco: Never Used  . Tobacco comment: quit smoking 14 years ago  Substance Use Topics  . Alcohol use: No    Alcohol/week: 0.0 standard drinks  . Drug use: No    Home Medications Prior to Admission medications   Medication Sig Start Date End Date Taking? Authorizing Provider  albuterol (PROVENTIL HFA;VENTOLIN HFA) 108 (90 Base) MCG/ACT inhaler Inhale 2 puffs into the lungs every 6 (six) hours as needed for wheezing or shortness of breath. 11/05/17   Roxan Hockey, MD  albuterol (PROVENTIL) (2.5 MG/3ML) 0.083% nebulizer solution Take 3 mLs (2.5 mg total) by nebulization every 6 (six) hours as needed for wheezing or shortness of breath. 11/05/17   Roxan Hockey, MD  Apixaban Starter Pack (ELIQUIS DVT/PE STARTER PACK) 5 MG TBPK Take 5 mg by mouth 2 (two) times daily. Take as directed on package: start with two-33m tablets twice daily for 7 days. On day 8, switch to one-533mtablet twice daily. 08/14/19   Swayze, Ava, DO  clopidogrel (PLAVIX) 75 MG tablet Take 1 tablet (75 mg total) by mouth daily. 05/13/19   Black, KaLezlie OctaveNP  darifenacin (ENABLEX) 7.5 MG 24 hr tablet Take 7.5 mg by mouth daily. 07/23/19   [provider]  feeding supplement, GLUCERNA SHAKE, (GLUCERNA SHAKE) LIQD Take 237 mLs by mouth 3 (three) times daily between meals. 08/14/19   Swayze, Ava, DO  gabapentin (NEURONTIN) 400 MG capsule Take 1 capsule (400 mg total) by mouth 3 (three) times daily. 08/14/19   Swayze, Ava, DO  glucose blood (ONETOUCH VERIO) test strip  Use as instructed to check blood sugar 3 times a day 09/04/18   GhPhilemon KingdomMD  Insulin Pen Needle (B-D UF III MINI PEN NEEDLES) 31G X 5 MM MISC Use to inject insulin once a day. 01/07/19   GhPhilemon KingdomMD  ipratropium-albuterol (DUONEB) 0.5-2.5 (3) MG/3ML SOLN Take 3 mLs by nebulization every 6 (six) hours as needed. 05/26/17  Carollee Herter, Yvonne R, DO  isosorbide mononitrate (IMDUR) 30 MG 24 hr tablet TAKE ONE HALF TABLET BY MOUTH EVERY DAY PATIENT NEEDS APPOINTMENT BEFORE NEXT REFILL. Patient taking differently: Take 15 mg by mouth daily.  07/01/19   Lendon Colonel, NP  levofloxacin (LEVAQUIN) 750 MG tablet Take 1 tablet (750 mg total) by mouth every other day for 6 days. 08/16/19 08/22/19  Swayze, Ava, DO  lidocaine-prilocaine (EMLA) cream Apply 1 application topically as needed. Patient taking differently: Apply 1 application topically as needed (to numb port site.).  02/28/19   Volanda Napoleon, MD  metFORMIN (GLUCOPHAGE) 500 MG tablet Take 1 tablet (500 mg total) by mouth 2 (two) times daily with a meal. 02/13/19   Philemon Kingdom, MD  metoprolol tartrate (LOPRESSOR) 25 MG tablet Take 0.5 tablets (12.5 mg total) by mouth 2 (two) times daily. 08/14/19   Swayze, Ava, DO  Multiple Vitamins-Minerals (CENTRUM SILVER ADULT 50+) TABS Take 1 tablet once a day 02/20/19   Carollee Herter, Alferd Apa, DO  nitroGLYCERIN (NITROSTAT) 0.4 MG SL tablet Place 1 tablet (0.4 mg total) under the tongue every 5 (five) minutes as needed. Patient taking differently: Place 0.4 mg under the tongue every 5 (five) minutes as needed for chest pain.  01/14/19   Lorretta Harp, MD  nystatin (MYCOSTATIN/NYSTOP) powder Apply topically 3 (three) times daily. 08/14/19   Swayze, Ava, DO  pantoprazole (PROTONIX) 40 MG tablet Take 1 tablet (40 mg total) by mouth daily. 07/16/19   Ann Held, DO  predniSONE (DELTASONE) 20 MG tablet Take 2 tablets (40 mg total) by mouth daily for 3 days, THEN 1 tablet (20 mg total)  daily for 3 days, THEN 0.5 tablets (10 mg total) daily for 3 days. 08/14/19 08/23/19  Swayze, Ava, DO  rosuvastatin (CRESTOR) 20 MG tablet Take 1 tablet (20 mg total) by mouth daily at 6 PM. 08/14/19   Swayze, Ava, DO  sertraline (ZOLOFT) 50 MG tablet Take 1 tablet (50 mg total) by mouth daily. 02/20/19   Roma Schanz R, DO  sucralfate (CARAFATE) 1 g tablet TAKE 1 TABLET BY MOUTH TWICE DAILY Patient taking differently: Take 1 g by mouth 2 (two) times daily.  02/05/19   [place], Holli Humbles, NP  traMADol (ULTRAM) 50 MG tablet TAKE TWO TABLETS BY MOUTH THREE TIMES DAILY AS NEEDED FOR MODERATE PAIN Patient taking differently: Take 100 mg by mouth 3 (three) times daily as needed for moderate pain.  04/05/19   Ann Held, DO  traZODone (DESYREL) 50 MG tablet Take 0.5 tablets (25 mg total) by mouth at bedtime as needed for sleep. 08/14/19   Swayze, Ava, DO    Allergies    Cephalexin, Adhesive [tape], Codeine, Zocor [simvastatin - high dose], and Penicillins  Review of Systems   Review of Systems  Constitutional: Positive for fatigue. Negative for chills and fever.  Eyes: Negative for photophobia and visual disturbance.  Respiratory: Positive for cough and shortness of breath.   Cardiovascular: Positive for leg swelling. Negative for palpitations.  Gastrointestinal: Negative for nausea and vomiting.  Neurological: Negative for syncope and speech difficulty.  All other systems reviewed and are negative.   Physical Exam Updated Vital Signs BP 92/78 (BP Location: Left Arm)   Pulse 87   Temp 98.1 F (36.7 C) (Oral)   Resp 16   Ht 5' 6"  (1.676 m)   Wt 87.1 kg   SpO2 100%   BMI 30.99 kg/m   Physical Exam  Vitals and nursing note reviewed.  Constitutional:      General: She is not in acute distress.    Appearance: She is well-developed.  HENT:     Head: Normocephalic and atraumatic.  Eyes:     Conjunctiva/sclera: Conjunctivae normal.  Cardiovascular:     Rate and Rhythm:  Normal rate and regular rhythm.     Heart sounds: No murmur.  Pulmonary:     Effort: Pulmonary effort is normal. No respiratory distress.     Breath sounds: No decreased breath sounds.     Comments: 98% on room air Abdominal:     Palpations: Abdomen is soft.     Tenderness: There is no abdominal tenderness.  Musculoskeletal:     Cervical back: Neck supple.     Comments: Bilateral symmetrical pitting edema  Skin:    General: Skin is warm and dry.  Neurological:     General: No focal deficit present.     Mental Status: She is alert and oriented to person, place, and time.     ED Results / Procedures / Treatments   Labs (all labs ordered are listed, but only abnormal results are displayed) Labs Reviewed  SARS CORONAVIRUS 2 BY RT PCR ([place]) - Abnormal; Notable for the following components:      Result Value   SARS Coronavirus 2 POSITIVE (*)    All other components within normal limits  CBC WITH DIFFERENTIAL/PLATELET - Abnormal; Notable for the following components:   RBC 3.24 (*)    Hemoglobin 9.3 (*)    HCT 26.9 (*)    RDW 17.0 (*)    Monocytes Absolute 1.1 (*)    Abs Immature Granulocytes 0.10 (*)    All other components within normal limits  COMPREHENSIVE METABOLIC PANEL - Abnormal; Notable for the following components:   Chloride 112 (*)    Glucose, Bld 160 (*)    Creatinine, Ser 1.89 (*)    Calcium 8.3 (*)    Total Protein 5.4 (*)    Albumin 2.1 (*)    ALT 55 (*)    Alkaline Phosphatase 211 (*)    GFR calc non Af Amer 27 (*)    GFR calc Af Amer 31 (*)    All other components within normal limits  URINALYSIS, ROUTINE W REFLEX MICROSCOPIC - Abnormal; Notable for the following components:   Leukocytes,Ua TRACE (*)    All other components within normal limits  URINALYSIS, MICROSCOPIC (REFLEX) - Abnormal; Notable for the following components:   Bacteria, UA RARE (*)    Non Squamous Epithelial PRESENT (*)    All other  components within normal limits  GLUCOSE, CAPILLARY - Abnormal; Notable for the following components:   Glucose-Capillary 100 (*)    All other components within normal limits  BRAIN NATRIURETIC PEPTIDE  GLUCOSE, CAPILLARY  TROPONIN I (HIGH SENSITIVITY)  TROPONIN I (HIGH SENSITIVITY)    EKG None  Radiology DG Chest Portable 1 View  Result Date: 08/16/2019 CLINICAL DATA:  Hypoxia. EXAM: PORTABLE CHEST 1 VIEW COMPARISON:  August 08, 2019 FINDINGS: There is stable right-sided venous Port-A-Cath positioning. There is no evidence of acute infiltrate, pleural effusion or pneumothorax. The heart size and mediastinal contours are within normal limits. Degenerative changes seen within the thoracic spine. IMPRESSION: No active disease. Electronically Signed   By: Virgina Norfolk M.D.   On: 08/16/2019 19:06    Procedures Procedures (including critical care time)  Medications Ordered in ED Medications  Chlorhexidine  Gluconate Cloth 2 % PADS 6 each (6 each Topical Given 08/17/19 0927)  traMADol (ULTRAM) tablet 100 mg (has no administration in time range)  isosorbide mononitrate (IMDUR) 24 hr tablet 15 mg (15 mg Oral Given 08/17/19 1158)  metoprolol tartrate (LOPRESSOR) tablet 12.5 mg (12.5 mg Oral Given 08/17/19 1158)  rosuvastatin (CRESTOR) tablet 20 mg (has no administration in time range)  sertraline (ZOLOFT) tablet 50 mg (50 mg Oral Given 08/17/19 1157)  traZODone (DESYREL) tablet 25 mg (has no administration in time range)  predniSONE (DELTASONE) tablet 20 mg (20 mg Oral Given 08/17/19 1158)  pantoprazole (PROTONIX) EC tablet 40 mg (40 mg Oral Given 08/17/19 1158)  sucralfate (CARAFATE) tablet 1 g (has no administration in time range)  darifenacin (ENABLEX) 24 hr tablet 7.5 mg (has no administration in time range)  clopidogrel (PLAVIX) tablet 75 mg (75 mg Oral Given 08/17/19 1158)  gabapentin (NEURONTIN) capsule 400 mg (400 mg Oral Given 08/17/19 1157)  albuterol (VENTOLIN HFA) 108 (90 Base)  MCG/ACT inhaler 2 puff (has no administration in time range)  nystatin (MYCOSTATIN/NYSTOP) topical powder (has no administration in time range)  acetaminophen (TYLENOL) tablet 650 mg (has no administration in time range)    Or  acetaminophen (TYLENOL) suppository 650 mg (has no administration in time range)  ondansetron (ZOFRAN) tablet 4 mg (has no administration in time range)    Or  ondansetron (ZOFRAN) injection 4 mg (has no administration in time range)  docusate sodium (COLACE) capsule 100 mg (100 mg Oral Given 08/17/19 1158)  multivitamin with minerals tablet 1 tablet (1 tablet Oral Given 08/17/19 1158)  insulin aspart (novoLOG) injection 0-20 Units (0 Units Subcutaneous Not Given 08/17/19 1200)  insulin aspart (novoLOG) injection 0-5 Units (has no administration in time range)  apixaban (ELIQUIS) tablet 5 mg (5 mg Oral Given 08/17/19 1158)  levofloxacin (LEVAQUIN) tablet 750 mg (has no administration in time range)  sodium chloride 0.9 % bolus 500 mL (0 mLs Intravenous Stopped 08/16/19 2245)    ED Course  I have reviewed the triage vital signs and the nursing notes.  Pertinent labs & imaging results that were available during my care of the patient were reviewed by me and considered in my medical decision making (see chart for details).  69 yo female w/ hx as noted above presenting to the ED with shortness of breath and leg swelling.  She was just discharged from the hospital 2 days ago, advised to go to SNF, but patient reports she wanted to go home instead, as she was afraid of COVID (of note, she had COVID infection in December).  She has since been sitting in her house and reports she cannot make it to her kitchen, or her bathroom, and she is not sure what her medications are.  She lives by herself.  There is apparently home health scheduled to come out tomorrow, but has not yet come to visit.   Unfortunately I think her demands require more than visiting home health.  With her limited  mobility, and questionable ability to comply with her extensive list of home medications, she needs to be in a SNF.  I made a very strong recommendation, and after she discussed this with her daughter on the phone, she told me she was agreeable with this plan.  She will need to be re-admitted to help with placement.  She remains on COVID airborn precautions given her recent COVID diagnosis.  In terms of her SOB, she is not hypoxic, and her CXR  appears clear.   Her delta troponin is stable and her BNP is largely unremarkable.  I suspect this is a chronic ongoing complaint, and have a lower suspicion for PE, ACS, CHF, or new bacterial pneumonia based on this clinical exam.  Her legs have symmetrical edema - she likely needs compliances with her diuresis regimen.    Her BP is soft here.  Upon reviewing her recent medical record, it appears her BP has been soft throughout her recent stay.  She seems to respond well to IVF and also her BP tends to drop when she is sleeping. I do not believe she is septic or is in cardiogenic shock or obstructive shock based on this presentation.  I suspect this is her baseline.  Her last echo was Feb 2020, she may benefit from another.   Final Clinical Impression(s) / ED Diagnoses Final diagnoses:  Hypotension, unspecified hypotension type  Leg swelling    Rx / DC Orders ED Discharge Orders    None       Wyvonnia Dusky, MD 08/17/19 1236

## 2019-08-16 NOTE — ED Triage Notes (Signed)
Swelling to both of her legs since yesterday. Her feet hurt. Hx of Covid January 1st. She has been admitted x 2 due to complications Covid related. She has a porta cath in her right chest.

## 2019-08-17 DIAGNOSIS — I2510 Atherosclerotic heart disease of native coronary artery without angina pectoris: Secondary | ICD-10-CM | POA: Diagnosis present

## 2019-08-17 DIAGNOSIS — B948 Sequelae of other specified infectious and parasitic diseases: Secondary | ICD-10-CM | POA: Diagnosis not present

## 2019-08-17 DIAGNOSIS — D649 Anemia, unspecified: Secondary | ICD-10-CM | POA: Diagnosis present

## 2019-08-17 DIAGNOSIS — N1832 Chronic kidney disease, stage 3b: Secondary | ICD-10-CM

## 2019-08-17 DIAGNOSIS — G894 Chronic pain syndrome: Secondary | ICD-10-CM | POA: Diagnosis not present

## 2019-08-17 DIAGNOSIS — I951 Orthostatic hypotension: Secondary | ICD-10-CM | POA: Diagnosis present

## 2019-08-17 DIAGNOSIS — E1142 Type 2 diabetes mellitus with diabetic polyneuropathy: Secondary | ICD-10-CM | POA: Diagnosis not present

## 2019-08-17 DIAGNOSIS — J4540 Moderate persistent asthma, uncomplicated: Secondary | ICD-10-CM | POA: Diagnosis present

## 2019-08-17 DIAGNOSIS — D573 Sickle-cell trait: Secondary | ICD-10-CM | POA: Diagnosis present

## 2019-08-17 DIAGNOSIS — K760 Fatty (change of) liver, not elsewhere classified: Secondary | ICD-10-CM | POA: Diagnosis not present

## 2019-08-17 DIAGNOSIS — E669 Obesity, unspecified: Secondary | ICD-10-CM | POA: Diagnosis present

## 2019-08-17 DIAGNOSIS — I252 Old myocardial infarction: Secondary | ICD-10-CM | POA: Diagnosis not present

## 2019-08-17 DIAGNOSIS — E1122 Type 2 diabetes mellitus with diabetic chronic kidney disease: Secondary | ICD-10-CM | POA: Diagnosis not present

## 2019-08-17 DIAGNOSIS — I82451 Acute embolism and thrombosis of right peroneal vein: Secondary | ICD-10-CM | POA: Diagnosis not present

## 2019-08-17 DIAGNOSIS — R21 Rash and other nonspecific skin eruption: Secondary | ICD-10-CM | POA: Diagnosis present

## 2019-08-17 DIAGNOSIS — I129 Hypertensive chronic kidney disease with stage 1 through stage 4 chronic kidney disease, or unspecified chronic kidney disease: Secondary | ICD-10-CM | POA: Diagnosis not present

## 2019-08-17 DIAGNOSIS — E785 Hyperlipidemia, unspecified: Secondary | ICD-10-CM | POA: Diagnosis present

## 2019-08-17 DIAGNOSIS — I25118 Atherosclerotic heart disease of native coronary artery with other forms of angina pectoris: Secondary | ICD-10-CM | POA: Diagnosis not present

## 2019-08-17 DIAGNOSIS — R627 Adult failure to thrive: Secondary | ICD-10-CM | POA: Diagnosis not present

## 2019-08-17 DIAGNOSIS — R0602 Shortness of breath: Secondary | ICD-10-CM | POA: Diagnosis not present

## 2019-08-17 DIAGNOSIS — G4733 Obstructive sleep apnea (adult) (pediatric): Secondary | ICD-10-CM | POA: Diagnosis present

## 2019-08-17 DIAGNOSIS — F329 Major depressive disorder, single episode, unspecified: Secondary | ICD-10-CM | POA: Diagnosis present

## 2019-08-17 DIAGNOSIS — N179 Acute kidney failure, unspecified: Secondary | ICD-10-CM | POA: Diagnosis present

## 2019-08-17 DIAGNOSIS — K219 Gastro-esophageal reflux disease without esophagitis: Secondary | ICD-10-CM | POA: Diagnosis present

## 2019-08-17 DIAGNOSIS — R296 Repeated falls: Secondary | ICD-10-CM | POA: Diagnosis present

## 2019-08-17 DIAGNOSIS — E860 Dehydration: Secondary | ICD-10-CM | POA: Diagnosis not present

## 2019-08-17 LAB — GLUCOSE, CAPILLARY
Glucose-Capillary: 94 mg/dL (ref 70–99)
Glucose-Capillary: 100 mg/dL — ABNORMAL HIGH (ref 70–99)
Glucose-Capillary: 208 mg/dL — ABNORMAL HIGH (ref 70–99)
Glucose-Capillary: 129 mg/dL — ABNORMAL HIGH (ref 70–99)

## 2019-08-17 LAB — URINALYSIS, ROUTINE W REFLEX MICROSCOPIC
Bilirubin Urine: NEGATIVE
Glucose, UA: NEGATIVE mg/dL
Hgb urine dipstick: NEGATIVE
Ketones, ur: NEGATIVE mg/dL
Nitrite: NEGATIVE
Protein, ur: NEGATIVE mg/dL
Specific Gravity, Urine: 1.015 (ref 1.005–1.030)
pH: 6 (ref 5.0–8.0)

## 2019-08-17 LAB — SARS CORONAVIRUS 2 BY RT PCR (HOSPITAL ORDER, PERFORMED IN ~~LOC~~ HOSPITAL LAB): SARS Coronavirus 2: POSITIVE — AB

## 2019-08-17 LAB — URINALYSIS, MICROSCOPIC (REFLEX)

## 2019-08-17 MED ORDER — DARIFENACIN HYDROBROMIDE ER 7.5 MG PO TB24
7.5000 mg | ORAL_TABLET | Freq: Every day | ORAL | Status: DC
  Administered 2019-08-17 – 2019-08-23 (×7): 7.5 mg via ORAL
  Filled 2019-08-17 (×8): qty 1

## 2019-08-17 MED ORDER — TRAMADOL HCL 50 MG PO TABS
100.0000 mg | ORAL_TABLET | Freq: Three times a day (TID) | ORAL | Status: DC | PRN
  Administered 2019-08-17 – 2019-08-23 (×13): 100 mg via ORAL
  Filled 2019-08-17 (×14): qty 2

## 2019-08-17 MED ORDER — NYSTATIN 100000 UNIT/GM EX POWD
Freq: Three times a day (TID) | CUTANEOUS | Status: DC
  Administered 2019-08-18: 1 via TOPICAL
  Filled 2019-08-17: qty 15

## 2019-08-17 MED ORDER — ALBUTEROL SULFATE HFA 108 (90 BASE) MCG/ACT IN AERS
2.0000 | INHALATION_SPRAY | Freq: Four times a day (QID) | RESPIRATORY_TRACT | Status: DC | PRN
  Administered 2019-08-17: 2 via RESPIRATORY_TRACT
  Filled 2019-08-17: qty 6.7

## 2019-08-17 MED ORDER — ACETAMINOPHEN 650 MG RE SUPP: 650.0000 mg | Freq: Four times a day (QID) | RECTAL | Status: DC | PRN

## 2019-08-17 MED ORDER — INSULIN ASPART 100 UNIT/ML ~~LOC~~ SOLN
0.0000 [IU] | Freq: Three times a day (TID) | SUBCUTANEOUS | Status: DC
  Administered 2019-08-17: 7 [IU] via SUBCUTANEOUS
  Administered 2019-08-18 – 2019-08-19 (×2): 4 [IU] via SUBCUTANEOUS
  Administered 2019-08-19: 11 [IU] via SUBCUTANEOUS
  Administered 2019-08-20: 4 [IU] via SUBCUTANEOUS
  Administered 2019-08-22: 17:00:00 3 [IU] via SUBCUTANEOUS
  Administered 2019-08-22 – 2019-08-23 (×2): 4 [IU] via SUBCUTANEOUS

## 2019-08-17 MED ORDER — ONDANSETRON HCL 4 MG/2ML IJ SOLN
4.0000 mg | Freq: Four times a day (QID) | INTRAMUSCULAR | Status: DC | PRN
  Administered 2019-08-21 (×2): 4 mg via INTRAVENOUS
  Filled 2019-08-17 (×2): qty 2

## 2019-08-17 MED ORDER — GABAPENTIN 400 MG PO CAPS
400.0000 mg | ORAL_CAPSULE | Freq: Three times a day (TID) | ORAL | Status: DC
  Administered 2019-08-17 – 2019-08-23 (×20): 400 mg via ORAL
  Filled 2019-08-17: qty 4
  Filled 2019-08-17: qty 1
  Filled 2019-08-17 (×4): qty 4
  Filled 2019-08-17 (×2): qty 1
  Filled 2019-08-17 (×2): qty 4
  Filled 2019-08-17 (×2): qty 1
  Filled 2019-08-17: qty 4
  Filled 2019-08-17: qty 1
  Filled 2019-08-17 (×2): qty 4
  Filled 2019-08-17: qty 1
  Filled 2019-08-17 (×2): qty 4
  Filled 2019-08-17: qty 1

## 2019-08-17 MED ORDER — SERTRALINE HCL 50 MG PO TABS
50.0000 mg | ORAL_TABLET | Freq: Every day | ORAL | Status: DC
  Administered 2019-08-17 – 2019-08-23 (×7): 50 mg via ORAL
  Filled 2019-08-17 (×7): qty 1

## 2019-08-17 MED ORDER — ONDANSETRON HCL 4 MG PO TABS: 4.0000 mg | ORAL_TABLET | Freq: Four times a day (QID) | ORAL | Status: DC | PRN

## 2019-08-17 MED ORDER — ISOSORBIDE MONONITRATE 15 MG HALF TABLET
15.0000 mg | ORAL_TABLET | Freq: Every day | ORAL | Status: DC
  Administered 2019-08-17: 12:00:00 15 mg via ORAL
  Filled 2019-08-17 (×3): qty 1

## 2019-08-17 MED ORDER — TRAZODONE HCL 50 MG PO TABS: 25.0000 mg | ORAL_TABLET | Freq: Every evening | ORAL | Status: DC | PRN

## 2019-08-17 MED ORDER — SODIUM CHLORIDE 0.45 % IV SOLN: INTRAVENOUS | Status: DC

## 2019-08-17 MED ORDER — APIXABAN 5 MG PO TABS
5.0000 mg | ORAL_TABLET | Freq: Two times a day (BID) | ORAL | Status: DC
  Administered 2019-08-17 – 2019-08-23 (×13): 5 mg via ORAL
  Filled 2019-08-17 (×13): qty 1

## 2019-08-17 MED ORDER — METOPROLOL TARTRATE 25 MG PO TABS
12.5000 mg | ORAL_TABLET | Freq: Two times a day (BID) | ORAL | Status: DC
  Administered 2019-08-17 (×2): 12.5 mg via ORAL
  Filled 2019-08-17 (×2): qty 1

## 2019-08-17 MED ORDER — INSULIN ASPART 100 UNIT/ML ~~LOC~~ SOLN
0.0000 [IU] | Freq: Every day | SUBCUTANEOUS | Status: DC
  Administered 2019-08-18 – 2019-08-19 (×2): 3 [IU] via SUBCUTANEOUS

## 2019-08-17 MED ORDER — CHLORHEXIDINE GLUCONATE CLOTH 2 % EX PADS
6.0000 | MEDICATED_PAD | Freq: Every day | CUTANEOUS | Status: DC
  Administered 2019-08-17 – 2019-08-18 (×2): 6 via TOPICAL

## 2019-08-17 MED ORDER — ADULT MULTIVITAMIN W/MINERALS CH
1.0000 | ORAL_TABLET | Freq: Every day | ORAL | Status: DC
  Administered 2019-08-17 – 2019-08-23 (×7): 1 via ORAL
  Filled 2019-08-17 (×7): qty 1

## 2019-08-17 MED ORDER — LEVOFLOXACIN 750 MG PO TABS
750.0000 mg | ORAL_TABLET | ORAL | Status: DC
  Administered 2019-08-18 – 2019-08-22 (×3): 750 mg via ORAL
  Filled 2019-08-17 (×3): qty 1

## 2019-08-17 MED ORDER — PANTOPRAZOLE SODIUM 40 MG PO TBEC
40.0000 mg | DELAYED_RELEASE_TABLET | Freq: Every day | ORAL | Status: DC
  Administered 2019-08-17 – 2019-08-23 (×7): 40 mg via ORAL
  Filled 2019-08-17 (×7): qty 1

## 2019-08-17 MED ORDER — DOCUSATE SODIUM 100 MG PO CAPS
100.0000 mg | ORAL_CAPSULE | Freq: Two times a day (BID) | ORAL | Status: DC
  Administered 2019-08-17 – 2019-08-23 (×12): 100 mg via ORAL
  Filled 2019-08-17 (×13): qty 1

## 2019-08-17 MED ORDER — ACETAMINOPHEN 325 MG PO TABS
650.0000 mg | ORAL_TABLET | Freq: Four times a day (QID) | ORAL | Status: DC | PRN
  Administered 2019-08-19: 650 mg via ORAL
  Filled 2019-08-17: qty 2

## 2019-08-17 MED ORDER — CLOPIDOGREL BISULFATE 75 MG PO TABS
75.0000 mg | ORAL_TABLET | Freq: Every day | ORAL | Status: DC
  Administered 2019-08-17 – 2019-08-23 (×7): 75 mg via ORAL
  Filled 2019-08-17 (×7): qty 1

## 2019-08-17 MED ORDER — SUCRALFATE 1 G PO TABS
1.0000 g | ORAL_TABLET | Freq: Two times a day (BID) | ORAL | Status: DC
  Administered 2019-08-17 – 2019-08-22 (×11): 1 g via ORAL
  Filled 2019-08-17 (×12): qty 1

## 2019-08-17 MED ORDER — LEVOFLOXACIN 750 MG PO TABS
750.0000 mg | ORAL_TABLET | ORAL | Status: DC
  Administered 2019-08-17: 750 mg via ORAL
  Filled 2019-08-17: qty 1

## 2019-08-17 MED ORDER — PREDNISONE 20 MG PO TABS
20.0000 mg | ORAL_TABLET | Freq: Every day | ORAL | Status: AC
  Administered 2019-08-17 – 2019-08-19 (×3): 20 mg via ORAL
  Filled 2019-08-17 (×3): qty 1

## 2019-08-17 MED ORDER — ROSUVASTATIN CALCIUM 5 MG PO TABS
20.0000 mg | ORAL_TABLET | Freq: Every day | ORAL | Status: DC
  Administered 2019-08-17 – 2019-08-23 (×7): 20 mg via ORAL
  Filled 2019-08-17 (×2): qty 4
  Filled 2019-08-17: qty 1
  Filled 2019-08-17 (×4): qty 4

## 2019-08-17 NOTE — Progress Notes (Signed)
Transferred patient to 3rd floor. Report given to Maudie Mercury, Therapist, sports. Patient alert and oriented x 4, no distress seen. Transported via wheelchair as per hospital policy.

## 2019-08-17 NOTE — Plan of Care (Signed)
Pt arrived to room 126, NAD, VSS anddeniesSOB or CP at this time. Pt arrived with belongings bag and purse. Pt expressed that she is upset because she was told she was going to rehab and not here. Head to toe skin assessment completed with Candise Bowens RN, pt noted to have bilateral lower extremities abrasion, blister on inner left thigh, abrasions on bilateral upper extremities, skin tearing of gluteal fold and some blanchable mild redden areas on buttocks with a healed wound to umbilicus area.

## 2019-08-17 NOTE — Plan of Care (Signed)
Sent Dr Vanita Ingles message to notify new pt arrival.

## 2019-08-17 NOTE — Evaluation (Signed)
Physical Therapy Evaluation Patient Details Name: [name] MRN: [id number] DOB: [birth year] Today's Date: 08/17/2019   History of Present Illness  69 y.o. female with past medical history significant for DM2, HLD, Asthma who presented to the ED for progressive cough, dyspnea over the past week (1/02). admitted to [facility] w/ COVID (dx 12/31) and d/c home (at her and family insistence)1/4. Readmitted to hosital w/ falls and COVID PNA on 1/14, d/c home once more 1/20. And returned to ED 1/22 w/ c/o BLE swelling and SOB.  Clinical Impression   Pt returned to hospital with BLE swelling and SOB. This pm pt states she is fatigued she ws brought to hospital late at night ad had ot rested well. Pt was on room air and sats in 90s at rest with mobility desat to 80s and back to 90s with rest again. Pt needing increased assist with mobility this pm, min a w/ bed mob but need bed fixtures and bed positioning to complete, sit<>stand from elevated bed with mod a and RW (multi attempts to get up), sit<>stand from commode w/ max a and RW. Pt ambulated approx 34f in room with RW and min a but needed max cues and encouragement to complete. Pt will greatly benefit from skilled acute care level PT while in hospital and continued post acute care level rehab at d/c, she is now agreeable to this.     Follow Up Recommendations SNF;Supervision/Assistance - 24 hour;CIR    Equipment Recommendations  None recommended by PT    Recommendations for Other Services OT consult     Precautions / Restrictions Precautions Precautions: Fall Restrictions Weight Bearing Restrictions: No Other Position/Activity Restrictions: c/o pain w/ WB from swelling      Mobility  Bed Mobility Overal bed mobility: Needs Assistance Bed Mobility: Supine to Sit;Sit to Supine Rolling: Supervision;Min guard   Supine to sit: Min assist Sit to supine: Min assist   General bed mobility comments: need bed fixtures and bed positioning to scoot  up and down  Transfers Overall transfer level: Needs assistance Equipment used: Rolling walker (2 wheeled) Transfers: Sit to/from Stand Sit to Stand: Max assist;From elevated surface;Mod assist(elevated surface mod a, low commode max a)            Ambulation/Gait Ambulation/Gait assistance: Min assist Gait Distance (Feet): 30 Feet Assistive device: Rolling walker (2 wheeled) Gait Pattern/deviations: Step-through pattern;Decreased stride length;Trunk flexed Gait velocity: decr   General Gait Details: needs max encouragement to complete ambultaion distance  Stairs            Wheelchair Mobility    Modified Rankin (Stroke Patients Only)       Balance Overall balance assessment: Needs assistance Sitting-balance support: Feet supported Sitting balance-Leahy Scale: Fair     Standing balance support: During functional activity;Bilateral upper extremity supported Standing balance-Leahy Scale: Fair                               Pertinent Vitals/Pain Pain Assessment: 0-10 Pain Score: 7  Pain Location: BLE with WB, mobility Pain Descriptors / Indicators: Grimacing;Discomfort;Aching Pain Intervention(s): Monitored during session;Limited activity within patient's tolerance    Home Living Family/patient expects to be discharged to:: Private residence Living Arrangements: Alone Available Help at Discharge: (no one to assist had fall alert) Type of Home: Apartment Home Access: Stairs to enter Entrance Stairs-Rails: Right Entrance Stairs-Number of Steps: 1 Home Layout: One level Home Equipment: Walker - 4 wheels;Shower  seat      Prior Function Level of Independence: Independent with assistive device(s)(but has had decline over this month of illness)               Hand Dominance   Dominant Hand: Right    Extremity/Trunk Assessment   Upper Extremity Assessment Upper Extremity Assessment: Generalized weakness    Lower Extremity  Assessment Lower Extremity Assessment: Generalized weakness       Communication   Communication: No difficulties  Cognition Arousal/Alertness: Awake/alert Behavior During Therapy: WFL for tasks assessed/performed Overall Cognitive Status: No family/caregiver present to determine baseline cognitive functioning                                 General Comments: same emotional state over level of functioning      General Comments General comments (skin integrity, edema, etc.): Pt was on room air and sats stayed in high 80s to low 90s    Exercises     Assessment/Plan    PT Assessment Patient needs continued PT services  PT Problem List Decreased strength;Decreased activity tolerance;Decreased balance;Decreased mobility;Decreased range of motion;Decreased coordination;Decreased knowledge of use of DME;Decreased safety awareness       PT Treatment Interventions Gait training;Functional mobility training;Therapeutic activities;Therapeutic exercise;Balance training;Neuromuscular re-education;Patient/family education;DME instruction    PT Goals (Current goals can be found in the Care Plan section)  Acute Rehab PT Goals Patient Stated Goal: agreeable to rehab now PT Goal Formulation: With patient Time For Goal Achievement: 08/31/19 Potential to Achieve Goals: Fair    Frequency Min 2X/week   Barriers to discharge Other (comment) lives alone, 2x failed attempts at dc home alone    Co-evaluation               AM-PAC PT "6 Clicks" Mobility  Outcome Measure Help needed turning from your back to your side while in a flat bed without using bedrails?: A Little Help needed moving from lying on your back to sitting on the side of a flat bed without using bedrails?: A Little Help needed moving to and from a bed to a chair (including a wheelchair)?: A Little Help needed standing up from a chair using your arms (e.g., wheelchair or bedside chair)?: A Lot Help needed to  walk in hospital room?: A Little Help needed climbing 3-5 steps with a railing? : Total 6 Click Score: 15    End of Session Equipment Utilized During Treatment: Gait belt Activity Tolerance: Patient limited by fatigue;Patient limited by lethargy;Patient limited by pain Patient left: in bed;with call bell/phone within reach Nurse Communication: Mobility status PT Visit Diagnosis: Unsteadiness on feet (R26.81);Other abnormalities of gait and mobility (R26.89);Muscle weakness (generalized) (M62.81)    Time: 8657-8469 PT Time Calculation (min) (ACUTE ONLY): 28 min   Charges:   PT Evaluation $PT Eval Moderate Complexity: 1 Mod PT Treatments $Therapeutic Activity: 8-22 mins        Horald Chestnut, PT   Delford Field 08/17/2019, 3:57 PM

## 2019-08-17 NOTE — H&P (Addendum)
[facility] History and Physical  [name] ENI:[id number] DOB: 1951-06-05 DOA: 08/16/2019   PCP: Ann Held, DO  Specialists: None  Chief Complaint: Leg swelling, generalized weakness, unable to walk  HPI: [name] is a 69 y.o. female 69 year old African-American female with a past medical history of diabetes mellitus type 2, hyperlipidemia, asthma, recent hospitalization for COVID-19 earlier in January was rehospitalized on January 14 due to generalized weakness, falls, diarrhea.  She had some abdominal pain and was found to have a periumbilical rash.  She was placed on antibiotics.  She was also noted to be orthostatic.  She also had acute on chronic kidney disease.  She was stabilized.  She was seen by PT who recommended skilled nursing facility for rehab.  Patient declined this and wanted to go home.  She was subsequently discharged home.  Patient apparently lives by herself.  She presented back to the emergency department complaining of leg swelling and shortness of breath.  She says that she has been unable to get around the house due to the swelling.  She thinks that she will need to go to a skilled nursing facility for rehab.  She denies any shortness of breath currently.  No chest pain.  No nausea or vomiting.  She wants something to eat this morning.  She denies any abdominal pain currently.  She was subsequently hospitalized for further management.   Home Medications: Prior to Admission medications   Medication Sig Start Date End Date Taking? Authorizing Provider  albuterol (PROVENTIL HFA;VENTOLIN HFA) 108 (90 Base) MCG/ACT inhaler Inhale 2 puffs into the lungs every 6 (six) hours as needed for wheezing or shortness of breath. 11/05/17   Roxan Hockey, MD  albuterol (PROVENTIL) (2.5 MG/3ML) 0.083% nebulizer solution Take 3 mLs (2.5 mg total) by nebulization every 6 (six) hours as needed for wheezing or shortness of breath. 11/05/17   Roxan Hockey, MD    Apixaban Starter Pack (ELIQUIS DVT/PE STARTER PACK) 5 MG TBPK Take 5 mg by mouth 2 (two) times daily. Take as directed on package: start with two-67m tablets twice daily for 7 days. On day 8, switch to one-570mtablet twice daily. 08/14/19   Swayze, Ava, DO  clopidogrel (PLAVIX) 75 MG tablet Take 1 tablet (75 mg total) by mouth daily. 05/13/19   Black, KaLezlie OctaveNP  darifenacin (ENABLEX) 7.5 MG 24 hr tablet Take 7.5 mg by mouth daily. 07/23/19   [provider]  feeding supplement, GLUCERNA SHAKE, (GLUCERNA SHAKE) LIQD Take 237 mLs by mouth 3 (three) times daily between meals. 08/14/19   Swayze, Ava, DO  gabapentin (NEURONTIN) 400 MG capsule Take 1 capsule (400 mg total) by mouth 3 (three) times daily. 08/14/19   Swayze, Ava, DO  glucose blood (ONETOUCH VERIO) test strip Use as instructed to check blood sugar 3 times a day 09/04/18   GhPhilemon KingdomMD  Insulin Pen Needle (B-D UF III MINI PEN NEEDLES) 31G X 5 MM MISC Use to inject insulin once a day. 01/07/19   GhPhilemon KingdomMD  ipratropium-albuterol (DUONEB) 0.5-2.5 (3) MG/3ML SOLN Take 3 mLs by nebulization every 6 (six) hours as needed. 05/26/17   LoRoma Schanz, DO  isosorbide mononitrate (IMDUR) 30 MG 24 hr tablet TAKE ONE HALF TABLET BY MOUTH EVERY DAY PATIENT NEEDS APPOINTMENT BEFORE NEXT REFILL. Patient taking differently: Take 15 mg by mouth daily.  07/01/19   LaLendon ColonelNP  levofloxacin (LEVAQUIN) 750 MG tablet Take 1 tablet (750 mg  total) by mouth every other day for 6 days. 08/16/19 08/22/19  Swayze, Ava, DO  lidocaine-prilocaine (EMLA) cream Apply 1 application topically as needed. Patient taking differently: Apply 1 application topically as needed (to numb port site.).  02/28/19   Volanda Napoleon, MD  metFORMIN (GLUCOPHAGE) 500 MG tablet Take 1 tablet (500 mg total) by mouth 2 (two) times daily with a meal. 02/13/19   Philemon Kingdom, MD  metoprolol tartrate (LOPRESSOR) 25 MG tablet Take 0.5 tablets (12.5 mg  total) by mouth 2 (two) times daily. 08/14/19   Swayze, Ava, DO  Multiple Vitamins-Minerals (CENTRUM SILVER ADULT 50+) TABS Take 1 tablet once a day 02/20/19   Carollee Herter, Alferd Apa, DO  nitroGLYCERIN (NITROSTAT) 0.4 MG SL tablet Place 1 tablet (0.4 mg total) under the tongue every 5 (five) minutes as needed. Patient taking differently: Place 0.4 mg under the tongue every 5 (five) minutes as needed for chest pain.  01/14/19   Lorretta Harp, MD  nystatin (MYCOSTATIN/NYSTOP) powder Apply topically 3 (three) times daily. 08/14/19   Swayze, Ava, DO  pantoprazole (PROTONIX) 40 MG tablet Take 1 tablet (40 mg total) by mouth daily. 07/16/19   Ann Held, DO  predniSONE (DELTASONE) 20 MG tablet Take 2 tablets (40 mg total) by mouth daily for 3 days, THEN 1 tablet (20 mg total) daily for 3 days, THEN 0.5 tablets (10 mg total) daily for 3 days. 08/14/19 08/23/19  Swayze, Ava, DO  rosuvastatin (CRESTOR) 20 MG tablet Take 1 tablet (20 mg total) by mouth daily at 6 PM. 08/14/19   Swayze, Ava, DO  sertraline (ZOLOFT) 50 MG tablet Take 1 tablet (50 mg total) by mouth daily. 02/20/19   Roma Schanz R, DO  sucralfate (CARAFATE) 1 g tablet TAKE 1 TABLET BY MOUTH TWICE DAILY Patient taking differently: Take 1 g by mouth 2 (two) times daily.  02/05/19   [place], Holli Humbles, NP  traMADol (ULTRAM) 50 MG tablet TAKE TWO TABLETS BY MOUTH THREE TIMES DAILY AS NEEDED FOR MODERATE PAIN Patient taking differently: Take 100 mg by mouth 3 (three) times daily as needed for moderate pain.  04/05/19   Ann Held, DO  traZODone (DESYREL) 50 MG tablet Take 0.5 tablets (25 mg total) by mouth at bedtime as needed for sleep. 08/14/19   Swayze, Ava, DO    Allergies:  Allergies  Allergen Reactions  . Cephalexin Itching, Rash and Other (See Comments)    Sensation of throat closing  . Adhesive [Tape] Other (See Comments)    Burn Skin  . Codeine Other (See Comments)    paranoid  . Zocor [Simvastatin - High Dose]  Other (See Comments)    Muscle spasms  . Penicillins Rash    Has patient had a PCN reaction causing immediate rash, facial/tongue/throat swelling, SOB or lightheadedness with hypotension: Yes Has patient had a PCN reaction causing severe rash involving mucus membranes or skin necrosis: No Has patient had a PCN reaction that required hospitalization does not remember.  Has patient had a PCN reaction occurring within the last 10 years: childhood If all of the above answers are "NO", then may proceed with Cephalosporin use.     Past Medical History: Past Medical History:  Diagnosis Date  . Asthma   . Degenerative joint disease   . Diabetes mellitus   . Dyspnea   . GERD (gastroesophageal reflux disease)   . Hyperlipidemia    no longer on medication for this  . Hypertension  patient denies  . Myocardial infarction ([place]) 08/2018  . Neuropathy    bilateral hands and feet  . Sickle cell trait ([place])   . Sleep apnea   . stomach ca dx'd 2010   chemo/xrt comp 05/2009  . TIA (transient ischemic attack) 07/2015    Past Surgical History:  Procedure Laterality Date  . APPENDECTOMY    . CESAREAN SECTION    . COLON SURGERY     colonscopy  . COLONOSCOPY WITH PROPOFOL N/A 06/01/2016   Procedure: COLONOSCOPY WITH PROPOFOL;  Surgeon: Wilford Corner, MD;  Location: [facility] ENDOSCOPY;  Service: Endoscopy;  Laterality: N/A;  . CORONARY STENT INTERVENTION N/A 08/29/2018   Procedure: CORONARY STENT INTERVENTION;  Surgeon: Wellington Hampshire, MD;  Location: [place] CV LAB;  Service: Cardiovascular;  Laterality: N/A;  . CORONARY/GRAFT ACUTE MI REVASCULARIZATION N/A 08/27/2018   Procedure: Coronary/Graft Acute MI Revascularization;  Surgeon: Leonie Man, MD;  Location: [place] [facility] LAB;  Service: Cardiovascular;  Laterality: N/A;  . ESOPHAGOGASTRODUODENOSCOPY N/A 10/15/2012   Procedure: ESOPHAGOGASTRODUODENOSCOPY (EGD);  Surgeon: Lear Ng, MD;  Location: [facility] ENDOSCOPY;  Service:  Endoscopy;  Laterality: N/A;  . ESOPHAGOGASTRODUODENOSCOPY N/A 01/15/2016   Procedure: ESOPHAGOGASTRODUODENOSCOPY (EGD);  Surgeon: Ronald Lobo, MD;  Location: [facility] ENDOSCOPY;  Service: Endoscopy;  Laterality: N/A;  . HERNIA REPAIR    . LEFT HEART CATH AND CORONARY ANGIOGRAPHY N/A 08/27/2018   Procedure: LEFT HEART CATH AND CORONARY ANGIOGRAPHY;  Surgeon: Leonie Man, MD;  Location: [place] [facility] LAB;  Service: Cardiovascular;  Laterality: N/A;  . SHOULDER SURGERY     Left  . TOTAL HIP ARTHROPLASTY     Right  . TOTAL KNEE ARTHROPLASTY     Left    Social History: Lives by herself.  Denies drinking alcohol or illicit drug use.  Is a former smoker.  Family History:  Family History  Problem Relation Age of Onset  . Diabetes Brother   . Alzheimer's disease Father 32  . Cancer Mother 37  . Diabetes Maternal Grandmother   . Cancer Maternal Grandfather        lung  . Healthy Child   . Suicidality Neg Hx   . Depression Neg Hx   . Dementia Neg Hx   . Anxiety disorder Neg Hx      Review of Systems - History obtained from the patient General ROS: positive for  - fatigue Psychological ROS: positive for - anxiety Ophthalmic ROS: negative ENT ROS: negative Allergy and Immunology ROS: negative Hematological and Lymphatic ROS: negative Endocrine ROS: negative Respiratory ROS: Occasional shortness of breath Cardiovascular ROS: no chest pain or dyspnea on exertion Gastrointestinal ROS: no abdominal pain, change in bowel habits, or black or bloody stools Genito-Urinary ROS: no dysuria, trouble voiding, or hematuria Musculoskeletal ROS: negative Neurological ROS: Neuropathy Dermatological ROS: negative  Physical Examination  Vitals:   08/17/19 0413 08/17/19 0700 08/17/19 0800 08/17/19 0900  BP: (!) 138/98  92/78   Pulse: 78 87 81 87  Resp: 18 15 17 16   Temp: 98 F (36.7 C)  98.1 F (36.7 C)   TempSrc: Oral  Oral   SpO2: 100% 100% 100% 100%  Weight:      Height:        BP  92/78 (BP Location: Left Arm)   Pulse 87   Temp 98.1 F (36.7 C) (Oral)   Resp 16   Ht 5' 6"  (1.676 m)   Wt 87.1 kg   SpO2 100%   BMI 30.99 kg/m  General appearance: alert, cooperative, appears stated age, no distress and moderately obese Head: Normocephalic, without obvious abnormality, atraumatic Eyes: conjunctivae/corneas clear. PERRL, EOM's intact. Nose: Nares normal. Septum midline. Mucosa normal. No drainage or sinus tenderness. Throat: lips, mucosa, and tongue normal; teeth and gums normal Neck: no adenopathy, no carotid bruit, no JVD, supple, symmetrical, trachea midline and thyroid not enlarged, symmetric, no tenderness/mass/nodules Resp: Normal effort at rest.  Few crackles at the bases but no wheezing or rhonchi. Cardio: regular rate and rhythm, S1, S2 normal, no murmur, click, rub or gallop GI: soft, non-tender; bowel sounds normal; no masses,  no organomegaly Extremities: Edema noted bilateral lower extremities.  Right greater than left. Pulses: Poorly palpable due to edema. Skin: Skin color, texture, turgor normal. No rashes or lesions Lymph nodes: Cervical, supraclavicular, and axillary nodes normal. Neurologic: Alert and oriented x3.  No facial asymmetry.  Motor strength equal bilateral upper and lower extremities.   Labs on Admission: I have personally reviewed following labs and imaging studies  CBC: Recent Labs  Lab 08/11/19 0500 08/12/19 0341 08/14/19 0500 08/16/19 2022  WBC 10.7* 7.4 6.2 10.4  NEUTROABS  --   --  4.8 5.8  HGB 9.7* 9.2* 9.6* 9.3*  HCT 26.8* 26.4* 27.3* 26.9*  MCV 80.5 81.0 81.0 83.0  PLT 278 261 222 329   Basic Metabolic Panel: Recent Labs  Lab 08/11/19 0500 08/12/19 0341 08/14/19 0500 08/16/19 2220  NA 143 143 142 143  K 3.4* 3.5 4.4 4.1  CL 114* 114* 111 112*  CO2 21* 23 23 26   GLUCOSE 196* 144* 303* 160*  BUN 14 14 11 19   CREATININE 1.55* 1.43* 1.40* 1.89*  CALCIUM 7.4* 7.7* 8.0* 8.3*  MG  --  1.6*  --   --   PHOS  --   2.5  --   --    GFR: Estimated Creatinine Clearance: 32.1 mL/min (A) (by C-G formula based on SCr of 1.89 mg/dL (H)). Liver Function Tests: Recent Labs  Lab 08/11/19 0500 08/12/19 0341 08/16/19 2220  AST 97* 70* 30  ALT 79* 69* 55*  ALKPHOS 134* 143* 211*  BILITOT 0.8 1.5* 0.9  PROT 4.4* 4.3* 5.4*  ALBUMIN 1.7* 1.9* 2.1*    Recent Labs  Lab 08/13/19 2138 08/14/19 0740 08/14/19 1202 08/17/19 0805 08/17/19 1150  GLUCAP 158* 261* 282* 94 100*     Radiological Exams on Admission: DG Chest Portable 1 View  Result Date: 08/16/2019 CLINICAL DATA:  Hypoxia. EXAM: PORTABLE CHEST 1 VIEW COMPARISON:  August 08, 2019 FINDINGS: There is stable right-sided venous Port-A-Cath positioning. There is no evidence of acute infiltrate, pleural effusion or pneumothorax. The heart size and mediastinal contours are within normal limits. Degenerative changes seen within the thoracic spine. IMPRESSION: No active disease. Electronically Signed   By: Virgina Norfolk M.D.   On: 08/16/2019 19:06      Problem List  Principal Problem:   FTT (failure to thrive) in adult Active Problems:   Diabetic polyneuropathy associated with type 2 diabetes mellitus (HCC)   Chronic pain syndrome   CKD (chronic kidney disease) stage 3, GFR 30-59 ml/min (HCC)   CAD (coronary artery disease)   Assessment: This is a 69 year old African-American female who has had multiple hospitalization recently, initially for complications of [id number]-16 and subsequently due to gastrointestinal issues.  She comes back to the hospital due to inability to care for herself at home, falling at home and unable to ambulate.  Plan:   Failure to thrive/generalized weakness/frequent falls  Patient's symptoms are mainly due to physical deconditioning.  She was offered skilled nursing facility during previous hospitalization which she declined and elected to go home where she lives by herself.  She has understood that she is unable to  manage for herself.  She requesting skilled nursing facility placement for rehabilitation.  We will consult social worker.  PT and OT evaluation.  Will be unsafe for her to go back home in her current state.  Recent COVID-19 pneumonia/acute respiratory failure with hypoxia This has resolved.  She was treated with steroids.  She still is on a steroid taper which will be continued.  She is currently saturating normal on room air.  Chest x-ray did not show any active disease.  It does not appear like she was given remdesivir during previous hospitalization.  No indication for same at this time.  Recent nausea vomiting and diarrhea Appears to have resolved.  Continue to monitor.  Diabetes mellitus type 2 in the setting of chronic kidney disease stage IIIb Monitor CBGs.  Elevated most likely due to steroids.  Continue SSI.  She only takes Metformin at home.  Not on insulin.  Periumbilical rash Continue nystatin.  She was also discharged on levofloxacin so she will be continued on the same.  Acute on chronic kidney disease stage IIIb Baseline creatinine 1.4-1.5.  Noted to be elevated.  Recheck labs.  Monitor urine output.  Avoid nephrotoxic agents.  Essential hypertension Monitor blood pressures closely.  Chronic pain syndrome/peripheral neuropathy Continue gabapentin and tramadol.  History of coronary artery disease Stable.  Continue Plavix and statin.  Recent acute lower extremity DVT Continue apixaban.  Normocytic anemia Hemoglobin at baseline.  Continue to monitor.  No evidence of overt bleeding.   DVT Prophylaxis: On apixaban Code Status: Full code Family Communication: Discussed with the patient Disposition: Will likely need to go to skilled nursing facility for rehab Consults called: None Admission Status: Inpatient  Severity of Illness: The appropriate patient status for this patient is INPATIENT. Inpatient status is judged to be reasonable and necessary in order to provide  the required intensity of service to ensure the patient's safety. The patient's presenting symptoms, physical exam findings, and initial radiographic and laboratory data in the context of their chronic comorbidities is felt to place them at high risk for further clinical deterioration. Furthermore, it is not anticipated that the patient will be medically stable for discharge from the hospital within 2 midnights of admission. The following factors support the patient status of inpatient.   " The patient's presenting symptoms include inability to ambulate, falls, generalized weakness. " The worrisome physical exam findings include lower extremity edema. " The initial radiographic and laboratory data are worrisome because of acute renal failure. " The chronic co-morbidities include diabetes mellitus type 2.   * I certify that at the point of admission it is my clinical judgment that the patient will require inpatient hospital care spanning beyond 2 midnights from the point of admission due to high intensity of service, high risk for further deterioration and high frequency of surveillance required.*  Further management decisions will depend on results of further testing and patient's response to treatment.   Edras Wilford [employer]  [facility] operational officer on [email or web address]  08/17/2019, 1:27 PM

## 2019-08-17 NOTE — Progress Notes (Signed)
[place] for apixaban Indication: DVT (07/28/19)  Patient Measurements: Height: 5' 6"  (167.6 cm) Weight: 192 lb 0.3 oz (87.1 kg) IBW/kg (Calculated) : 59.3  Labs: Recent Labs    08/16/19 2022 08/16/19 2136 08/16/19 2220  HGB 9.3*  --   --   HCT 26.9*  --   --   PLT 160  --   --   CREATININE  --   --  1.89*  TROPONINIHS 15 14  --     Estimated Creatinine Clearance: 32.1 mL/min (A) (by C-G formula based on SCr of 1.89 mg/dL (H)).  Assessment: 73 yof who had COVID in early January readmitted with swollen legs and feet. Pharmacy consulted to resume apixaban for DVT diagnosed in early January.  No bleeding noted, Hgb been stable 9-10s, platelets are low normal. SCr up to 1.89 (baseline 1.4-1.5) although there is no adjustment for DVT indication.  Plan:  Resume apixaban 5 mg PO bid - plan was for at least 3 months treatment Pharmacy signing off but will continue to follow peripherally - please re-consult if needed  Thank you for involving pharmacy in this patient's care.  Renold Genta, PharmD, BCPS Clinical Pharmacist Clinical phone for 08/17/2019 until 3:30p is [phone] 08/17/2019 11:25 AM  **Pharmacist phone directory can be found on [email or web address] listed under [place]**

## 2019-08-17 NOTE — Plan of Care (Signed)
  Problem: Education: Goal: Knowledge of risk factors and measures for prevention of condition will improve Outcome: Progressing   Problem: Coping: Goal: Psychosocial and spiritual needs will be supported Outcome: Progressing   Problem: Respiratory: Goal: Will maintain a patent airway Outcome: Progressing Goal: Complications related to the disease process, condition or treatment will be avoided or minimized Outcome: Progressing   

## 2019-08-17 NOTE — Progress Notes (Signed)
Rehab Admissions Coordinator Note:  Per PT recommendation, patient was screened by Michel Santee for appropriateness for an Inpatient Acute Rehab Consult.  At this time, we are recommending Inpatient Rehab consult. Please place an order if pt would like to be considered for CIR admit.    Michel Santee 08/17/2019, 4:04 PM  I can be reached at [phone].

## 2019-08-18 ENCOUNTER — Encounter: Payer: Self-pay | Admitting: Family Medicine

## 2019-08-18 DIAGNOSIS — G620 Drug-induced polyneuropathy: Secondary | ICD-10-CM | POA: Insufficient documentation

## 2019-08-18 DIAGNOSIS — I25118 Atherosclerotic heart disease of native coronary artery with other forms of angina pectoris: Secondary | ICD-10-CM

## 2019-08-18 LAB — BASIC METABOLIC PANEL
Anion gap: 6 (ref 5–15)
BUN: 16 mg/dL (ref 8–23)
CO2: 26 mmol/L (ref 22–32)
Calcium: 7.6 mg/dL — ABNORMAL LOW (ref 8.9–10.3)
Chloride: 110 mmol/L (ref 98–111)
Creatinine, Ser: 1.31 mg/dL — ABNORMAL HIGH (ref 0.44–1.00)
GFR calc Af Amer: 49 mL/min — ABNORMAL LOW (ref >60)
GFR calc non Af Amer: 42 mL/min — ABNORMAL LOW (ref >60)
Glucose, Bld: 172 mg/dL — ABNORMAL HIGH (ref 70–99)
Potassium: 4.8 mmol/L (ref 3.5–5.1)
Sodium: 142 mmol/L (ref 135–145)

## 2019-08-18 LAB — CBC
HCT: 23.3 % — ABNORMAL LOW (ref 36.0–46.0)
Hemoglobin: 8.2 g/dL — ABNORMAL LOW (ref 12.0–15.0)
MCH: 29 pg (ref 26.0–34.0)
MCHC: 35.2 g/dL (ref 30.0–36.0)
MCV: 82.3 fL (ref 80.0–100.0)
Platelets: 169 10*3/uL (ref 150–400)
RBC: 2.83 MIL/uL — ABNORMAL LOW (ref 3.87–5.11)
RDW: 16.8 % — ABNORMAL HIGH (ref 11.5–15.5)
WBC: 8.3 10*3/uL (ref 4.0–10.5)
nRBC: 0.4 % — ABNORMAL HIGH (ref 0.0–0.2)

## 2019-08-18 LAB — GLUCOSE, CAPILLARY
Glucose-Capillary: 180 mg/dL — ABNORMAL HIGH (ref 70–99)
Glucose-Capillary: 70 mg/dL (ref 70–99)
Glucose-Capillary: 149 mg/dL — ABNORMAL HIGH (ref 70–99)
Glucose-Capillary: 245 mg/dL — ABNORMAL HIGH (ref 70–99)

## 2019-08-18 NOTE — Progress Notes (Signed)
Occupational Therapy Evaluation Patient Details Name: [name] MRN: [id number] DOB: 12/15/1950 Today's Date: 08/18/2019    History of Present Illness 69 y.o. female with past medical history significant for DM2, HLD, Asthma who presented to the ED for progressive cough, dyspnea over the past week (1/02). admitted to [facility] w/ COVID (dx 12/31) and d/c home (at her and family insistence)1/4. Readmitted to hosital w/ falls and COVID PNA on 1/14, d/c home once more 1/20. And returned to ED 1/22 w/ c/o BLE swelling and SOB.   Clinical Impression   PTA pt lived alone, independent in ADL, IADL, and mobility tasks. Pt reports using a 4WW and has had 4 falls in the last 6 months. Pt does not have a car and reports that daughter takes her grocery shopping. Pt on room air throughout session with no reports of shortness of breath. Pt currently independent to mod assist for self-care and functional transfer tasks. Pt able to transfer to/from bedside chair with RW and min guard. VATS RN in to place port with pt assisted back to bed from chair and then back into chair following procedure. Pt required encouragement throughout to engage in therapy tasks. Pt able to complete grooming/hygiene and lower body dressing task while seated. Noted 0 instances of loss of balance throughout. Pt demonstrates decreased ROM, strength, endurance, balance, standing tolerance, and activity tolerance impacting ability to complete self-care and functional transfer tasks. Recommend skilled OT services to address above deficits in order to promote function and prevent further decline. Pt unable to manage at home by herself prior to this admission. Recommend discharge to rehab facility prior to discharge home.      Follow Up Recommendations  SNF;Supervision/Assistance - 24 hour;CIR    Equipment Recommendations  None recommended by OT    Recommendations for Other Services       Precautions / Restrictions Precautions Precautions:  Fall Restrictions Weight Bearing Restrictions: No      Mobility Bed Mobility Overal bed mobility: Needs Assistance Bed Mobility: Supine to Sit;Sit to Supine     Supine to sit: Min assist;HOB elevated Sit to supine: Min assist;HOB elevated   General bed mobility comments: HOB elevated, use of bedrail. Assist with trunk and BLEs.   Transfers Overall transfer level: Needs assistance Equipment used: Rolling walker (2 wheeled) Transfers: Sit to/from [facility] Sit to Stand: Mod assist;Min assist(Min assist from bed, mod assist from chair) Stand pivot transfers: Min guard       General transfer comment: Increased assist from low surface.    Balance Overall balance assessment: Needs assistance Sitting-balance support: Feet supported Sitting balance-Leahy Scale: Fair       Standing balance-Leahy Scale: Fair                             ADL either performed or assessed with clinical judgement   ADL Overall ADL's : Needs assistance/impaired Eating/Feeding: Independent;Sitting   Grooming: Set up;Supervision/safety;Sitting   Upper Body Bathing: Set up;Supervision/ safety;Sitting   Lower Body Bathing: Supervison/ safety;Min guard;Sit to/from stand   Upper Body Dressing : Supervision/safety;Set up;Sitting   Lower Body Dressing: Supervision/safety;Min guard;Sit to/from stand   Toilet Transfer: Min Psychologist, counselling- Water quality scientist and Hygiene: Min guard;Supervision/safety;Sit to/from stand       Functional mobility during ADLs: Min guard;Rolling walker General ADL Comments: Pt able to transfer to/from [facility] with min guard. Noted 0 instances of LOB.     Vision Baseline  Vision/History: Wears glasses Wears Glasses: Reading only       Perception     Praxis      Pertinent Vitals/Pain Pain Assessment: No/denies pain     Hand Dominance Right   Extremity/Trunk Assessment Upper Extremity Assessment Upper Extremity Assessment:  Generalized weakness(LUE shld flex ~80 degrees)   Lower Extremity Assessment Lower Extremity Assessment: Defer to PT evaluation       Communication Communication Communication: No difficulties   Cognition Arousal/Alertness: Awake/alert Behavior During Therapy: WFL for tasks assessed/performed Overall Cognitive Status: No family/caregiver present to determine baseline cognitive functioning                                 General Comments: Pt requires encouragement to engage in OOB activities   General Comments  Pt on room air. No reports of SOB throughout. VAST RN in during session to place port. Pt assisted back to bed for procedure and assisted back to chair following.      Exercises Exercises: Other exercises Other Exercises Other Exercises: Encouraged use of incentive spirometer with pt reporting that she's been completing regularly.   Shoulder Instructions      Home Living Family/patient expects to be discharged to:: Private residence Living Arrangements: Alone Available Help at Discharge: Other (Comment)(No one to assist at home. Has a fall alert system.) Type of Home: Apartment Home Access: Stairs to enter Entrance Stairs-Number of Steps: 1 Entrance Stairs-Rails: Right Home Layout: One level     Bathroom Shower/Tub: Teacher, early years/pre: Standard     Home Equipment: Environmental consultant - 4 wheels;Shower seat   Additional Comments: pt states her children can assist intermittently, but not 24/7      Prior Functioning/Environment Level of Independence: Independent with assistive device(s)(Pt reports decline over the last month)        Comments: Pt independent with ADLs, IADLs, and mobility. Pt reports using a 4WW at home. Pt reports 4 falls in the last 6 months. Pt states that she can drive, however does not have a car. Pt reports that daughter takes her grocery shopping.        OT Problem List: Decreased strength;Decreased range of  motion;Decreased activity tolerance;Impaired balance (sitting and/or standing);Cardiopulmonary status limiting activity      OT Treatment/Interventions: Self-care/ADL training;Therapeutic exercise;Neuromuscular education;Energy conservation;DME and/or AE instruction;Therapeutic activities;Patient/family education;Balance training    OT Goals(Current goals can be found in the care plan section) Acute Rehab OT Goals Patient Stated Goal: Agreeable to sitting up in the chair. Time For Goal Achievement: 09/01/19 Potential to Achieve Goals: Good ADL Goals Pt Will Perform Grooming: with modified independence;standing Pt Will Perform Lower Body Bathing: with modified independence;sit to/from stand Pt Will Perform Lower Body Dressing: with modified independence;sit to/from stand Pt Will Transfer to Toilet: with modified independence;ambulating;regular height toilet Pt Will Perform Toileting - Clothing Manipulation and hygiene: with modified independence;sit to/from stand Additional ADL Goal #1: Pt to tolerate standing up to 10 minutes with modified independence, in preparation for ADLs. Additional ADL Goal #2: Pt to recall and verbalize 3 fall prevention strategies with 0 verbal cues. Additional ADL Goal #3: Pt to recall and verbalize 3 energy conservation techniques with 0 verbal cues.  OT Frequency: Min 3X/week   Barriers to D/C:            Co-evaluation              AM-PAC OT "6 Clicks" Daily  Activity     Outcome Measure Help from another person eating meals?: None Help from another person taking care of personal grooming?: A Little Help from another person toileting, which includes using toliet, bedpan, or urinal?: A Little Help from another person bathing (including washing, rinsing, drying)?: A Little Help from another person to put on and taking off regular upper body clothing?: A Little Help from another person to put on and taking off regular lower body clothing?: A Little 6  Click Score: 19   End of Session Equipment Utilized During Treatment: Rolling walker Nurse Communication: Mobility status  Activity Tolerance: Patient tolerated treatment well Patient left: in chair;with call bell/phone within reach  OT Visit Diagnosis: Unsteadiness on feet (R26.81);Muscle weakness (generalized) (M62.81)                Time: 3374-4514 OT Time Calculation (min): 38 min Charges:  OT General Charges $OT Visit: 1 Visit OT Evaluation $OT Eval Low Complexity: 1 Low OT Treatments $Self Care/Home Management : 8-22 mins $Therapeutic Activity: 8-22 mins  Mauri Brooklyn OTR/L [phone]   Mauri Brooklyn 08/18/2019, 2:23 PM

## 2019-08-18 NOTE — Progress Notes (Signed)
PROGRESS NOTE  [name] SAY:[id number] DOB: 1950-08-09 DOA: 08/16/2019  PCP: Ann Held, DO  Brief History/Interval Summary: 69 y.o. female 68 year old African-American female with a past medical history of diabetes mellitus type 2, hyperlipidemia, asthma, recent hospitalization for COVID-19 earlier in January was rehospitalized on January 14 due to generalized weakness, falls, diarrhea.  She had some abdominal pain and was found to have a periumbilical rash.  She was placed on antibiotics.  She was also noted to be orthostatic.  She also had acute on chronic kidney disease.  She was stabilized.  She was seen by PT who recommended skilled nursing facility for rehab.  Patient declined this and wanted to go home.  She was subsequently discharged home.  Patient apparently lives by herself.  She presented back to the emergency department complaining of leg swelling and shortness of breath.  She says that she has been unable to get around the house due to the swelling.  She thinks that she will need to go to a skilled nursing facility for rehab.  She denies any shortness of breath currently.  No chest pain.  No nausea or vomiting.  She wants something to eat this morning.  She denies any abdominal pain currently.  She was subsequently hospitalized for further management.   Reason for Visit: Physical deconditioning.  Acute renal failure.  Consultants: None  Procedures: None  Antibiotics: Anti-infectives (From admission, onward)   Start     Dose/Rate Route Frequency Ordered Stop   08/18/19 0800  levofloxacin (LEVAQUIN) tablet 750 mg     750 mg Oral Every 48 hours 08/17/19 1118     08/17/19 1100  levofloxacin (LEVAQUIN) tablet 750 mg  Status:  Discontinued     750 mg Oral Every 48 hours 08/17/19 1051 08/17/19 1118      Subjective/Interval History: Patient states that she is feeling better.  Denies any dizziness or lightheadedness.  No shortness of breath.  Urinating  well.    Assessment/Plan:  Failure to thrive/generalized weakness/frequent falls This is mainly due to physical deconditioning from recent hospitalizations for COVID-19.  Skilled nursing facility was recommended the last time she was in the hospital however she declined and went home with home health.  Did not do well and came back to the ED.  Was found to have renal failure.  Now more agreeable to go to skilled nursing facility for rehab.  ET and OT have been consulted.  Recent COVID-19 pneumonia Seems to have improved.  She was treated with steroids.  Not given remdesivir during previous hospitalization.  Currently stable.  Saturating normal on room air.  Recent nausea vomiting and diarrhea Appears to have resolved.  Hypotension Blood pressure is noted to be borderline low.  She is on isosorbide mononitrate along with metoprolol at home.  These have been held for now.  Monitor blood pressures closely.  She is asymptomatic.  Diabetes mellitus type 2 in the setting of chronic kidney disease stage IIIb CBGs are reasonably well controlled.  Continue to monitor.  Acute on chronic kidney disease stage IIIb Baseline creatinines around 1.4-1.5.  Was noted to be elevated at admission due to dehydration.  Improved with gentle IV hydration.  Continue to monitor.  Periumbilical rash Continue nystatin and levofloxacin.  Chronic pain syndrome/peripheral neuropathy Continue gabapentin and tramadol.  History of coronary artery disease Continue Plavix and statin.  Recent acute lower extremity DVT Continue apixaban  Normocytic anemia Hemoglobin close to baseline.  No evidence for overt  bleeding.  Continue to monitor.  Obesity Estimated body mass index is 30.99 kg/m as calculated from the following:   Height as of this encounter: 5' 6"  (1.676 m).   Weight as of this encounter: 87.1 kg.   DVT Prophylaxis: On apixaban Code Status: Full code Family Communication: Daughter was updated  earlier today Disposition Plan: Placement to skilled nursing facility for short-term rehab.   Medications:  Scheduled: . apixaban  5 mg Oral BID  . Chlorhexidine Gluconate Cloth  6 each Topical Daily  . clopidogrel  75 mg Oral Daily  . darifenacin  7.5 mg Oral Daily  . docusate sodium  100 mg Oral BID  . gabapentin  400 mg Oral TID  . insulin aspart  0-20 Units Subcutaneous TID WC  . insulin aspart  0-5 Units Subcutaneous QHS  . levofloxacin  750 mg Oral Q48H  . multivitamin with minerals  1 tablet Oral Daily  . nystatin   Topical TID  . pantoprazole  40 mg Oral Daily  . predniSONE  20 mg Oral Q breakfast  . rosuvastatin  20 mg Oral q1800  . sertraline  50 mg Oral Daily  . sucralfate  1 g Oral BID   Continuous:  YKZ:[id number] **OR** acetaminophen, albuterol, ondansetron **OR** ondansetron (ZOFRAN) IV, traMADol, traZODone   Objective:  Vital Signs  Vitals:   08/17/19 1735 08/17/19 1848 08/18/19 0600 08/18/19 0725  BP: 92/68 109/78 (!) 93/58 (!) 96/55  Pulse: 79 93 74 70  Resp: 18  18 16   Temp: 98.3 F (36.8 C) 98.3 F (36.8 C) 98.6 F (37 C) 98.1 F (36.7 C)  TempSrc: Oral Oral Oral Oral  SpO2:  98% 98% 96%  Weight:      Height:        Intake/Output Summary (Last 24 hours) at 08/18/2019 1428 Last data filed at 08/18/2019 0601 Gross per 24 hour  Intake 1627.5 ml  Output 1300 ml  Net 327.5 ml   Filed Weights   08/16/19 1810  Weight: 87.1 kg    General appearance: Awake alert.  In no distress Resp: Clear to auscultation bilaterally.  Normal effort Cardio: S1-S2 is normal regular.  No S3-S4.  No rubs murmurs or bruit GI: Abdomen is soft.  Nontender nondistended.  Bowel sounds are present normal.  No masses organomegaly Extremities: No edema.  Full range of motion of lower extremities. Neurologic: Alert and oriented x3.  No focal neurological deficits.    Lab Results:  Data Reviewed: I have personally reviewed following labs and imaging  studies  CBC: Recent Labs  Lab 08/12/19 0341 08/14/19 0500 08/16/19 2022 08/18/19 0638  WBC 7.4 6.2 10.4 8.3  NEUTROABS  --  4.8 5.8  --   HGB 9.2* 9.6* 9.3* 8.2*  HCT 26.4* 27.3* 26.9* 23.3*  MCV 81.0 81.0 83.0 82.3  PLT 261 222 160 300    Basic Metabolic Panel: Recent Labs  Lab 08/12/19 0341 08/14/19 0500 08/16/19 2220 08/18/19 0638  NA 143 142 143 142  K 3.5 4.4 4.1 4.8  CL 114* 111 112* 110  CO2 23 23 26 26   GLUCOSE 144* 303* 160* 172*  BUN 14 11 19 16   CREATININE 1.43* 1.40* 1.89* 1.31*  CALCIUM 7.7* 8.0* 8.3* 7.6*  MG 1.6*  --   --   --   PHOS 2.5  --   --   --     GFR: Estimated Creatinine Clearance: 46.3 mL/min (A) (by C-G formula based on SCr of 1.31 mg/dL (H)).  Liver Function Tests: Recent Labs  Lab 08/12/19 0341 08/16/19 2220  AST 70* 30  ALT 69* 55*  ALKPHOS 143* 211*  BILITOT 1.5* 0.9  PROT 4.3* 5.4*  ALBUMIN 1.9* 2.1*     CBG: Recent Labs  Lab 08/17/19 1150 08/17/19 1635 08/17/19 2053 08/18/19 0728 08/18/19 1135  GLUCAP 100* 208* 129* 180* 70     Recent Results (from the past 240 hour(s))  GI pathogen panel by PCR, stool     Status: Abnormal   Collection Time: 08/10/19  8:03 AM   Specimen: STOOL  Result Value Ref Range Status   Plesiomonas shigelloides NOT DETECTED NOT DETECTED Final   Yersinia enterocolitica NOT DETECTED NOT DETECTED Final   Vibrio NOT DETECTED NOT DETECTED Final   Enteropathogenic E coli DETECTED (A) NOT DETECTED Final   E coli (ETEC) LT/ST NOT DETECTED NOT DETECTED Final   E coli 3875 by PCR Not applicable NOT DETECTED Final   Cryptosporidium by PCR NOT DETECTED NOT DETECTED Final   Entamoeba histolytica NOT DETECTED NOT DETECTED Final   Adenovirus F 40/41 NOT DETECTED NOT DETECTED Final   Norovirus GI/GII NOT DETECTED NOT DETECTED Final   Sapovirus NOT DETECTED NOT DETECTED Final    Comment: (NOTE) Performed At: [facility] [address] [place], [place] [id number] Rush Farmer MD  AC:[id number]    Vibrio cholerae NOT DETECTED NOT DETECTED Final   Campylobacter by PCR NOT DETECTED NOT DETECTED Final   Salmonella by PCR NOT DETECTED NOT DETECTED Final   E coli (STEC) NOT DETECTED NOT DETECTED Final   Enteroaggregative E coli NOT DETECTED NOT DETECTED Final   Shigella by PCR NOT DETECTED NOT DETECTED Final   Cyclospora cayetanensis NOT DETECTED NOT DETECTED Final   Astrovirus NOT DETECTED NOT DETECTED Final   G lamblia by PCR NOT DETECTED NOT DETECTED Final   Rotavirus A by PCR NOT DETECTED NOT DETECTED Final  Culture, blood (routine x 2)     Status: None   Collection Time: 08/10/19  4:41 PM   Specimen: BLOOD  Result Value Ref Range Status   Specimen Description   Final    BLOOD RIGHT ANTECUBITAL Performed at [facility], [place] [address], [place], [place] [address]    Special Requests   Final    BOTTLES DRAWN AEROBIC AND ANAEROBIC Blood Culture adequate volume Performed at [facility] [address], [place], [place] [address]    Culture   Final    NO GROWTH 5 DAYS Performed at [facility], [place] [address], [place], [place] [address]    Report Status 08/15/2019 FINAL  Final  Culture, blood (routine x 2)     Status: None   Collection Time: 08/10/19  4:41 PM   Specimen: BLOOD  Result Value Ref Range Status   Specimen Description   Final    BLOOD RIGHT WRIST Performed at [facility] [address] [place], [place] [address]    Special Requests   Final    BOTTLES DRAWN AEROBIC ONLY Blood Culture adequate volume Performed at [facility] [address] [place], [place] [address]    Culture   Final    NO GROWTH 5 DAYS Performed at [facility], [place] [address], [place], [place] [address]    Report Status 08/15/2019 FINAL  Final  SARS Coronavirus 2 by RT PCR (hospital order, performed in [facility] hospital lab) Nasopharyngeal Nasopharyngeal Swab     Status:  Abnormal   Collection Time: 08/16/19 10:39 PM   Specimen: Nasopharyngeal Swab  Result Value Ref Range Status   SARS Coronavirus 2 POSITIVE (A) NEGATIVE Final    Comment: RESULT CALLED TO, READ BACK BY AND VERIFIED WITH: ADKINS,L,RN @ 2440 08/17/19 BY GWYN,P (NOTE) SARS-CoV-2 target nucleic acids are DETECTED SARS-CoV-2 RNA is generally detectable in upper respiratory specimens  during the acute phase of infection.  Positive results are indicative  of the presence of the identified virus, but do not rule out bacterial infection or co-infection with other pathogens not detected by the test.  Clinical correlation with patient history and  other diagnostic information is necessary to determine patient infection status.  The expected result is negative. Fact Sheet for Patients:   StrictlyIdeas.no  Fact Sheet for Healthcare Providers:   BankingDealers.co.za   This test is not yet approved or cleared by the [place] FDA and  has been authorized for detection and/or diagnosis of SARS-CoV-2 by FDA under an Emergency Use Authorization (EUA).  This EUA will remain in effect (meaning this test can  be used) for the duration of  the COVID-19 declaration under Section 564(b)(1) of the Act, 21 U.S.C. section 360-bbb-3(b)(1), unless the authorization is terminated or revoked sooner. Performed at [facility], [address] [place], [place] [address]       Radiology Studies: DG Chest Portable 1 View  Result Date: 08/16/2019 CLINICAL DATA:  Hypoxia. EXAM: PORTABLE CHEST 1 VIEW COMPARISON:  August 08, 2019 FINDINGS: There is stable right-sided venous Port-A-Cath positioning. There is no evidence of acute infiltrate, pleural effusion or pneumothorax. The heart size and mediastinal contours are within normal limits. Degenerative changes seen within the thoracic spine. IMPRESSION: No active disease. Electronically Signed   By: Virgina Norfolk M.D.   On: 08/16/2019 19:06       LOS: 1 day   [place] [facility] Pager on [email or web address]  08/18/2019, 2:28 PM

## 2019-08-18 NOTE — Assessment & Plan Note (Signed)
con't pt / ot

## 2019-08-18 NOTE — Plan of Care (Signed)
   Vital Signs MEWS/VS Documentation       08/18/2019 0600 08/18/2019 0725 08/18/2019 0900 08/18/2019 1611   MEWS Score:  1  1  1  1    MEWS Score Color:  [name]  Green  Green  Green   Resp:  18  16  --  16   Pulse:  74  70  --  72   BP:  (!) 93/58  (!) 96/55  --  (!) 99/56   Temp:  98.6 F (37 C)  98.1 F (36.7 C)  --  97.9 F (36.6 C)   O2 Device:  Room Air  Room Air  --  Room Air   Level of Consciousness:  --  Alert  Alert  --        POC reviewed with pt.    [name] 08/18/2019,10:52 PM

## 2019-08-18 NOTE — Progress Notes (Signed)
Virtual Visit via Video Note  I connected with [name] on 08/18/19 at  2:20 PM EST by a video enabled telemedicine application and verified that I am speaking with the correct person using two identifiers.  Location: Patient: home alone  Provider: office   I discussed the limitations of evaluation and management by telemedicine and the availability of in person appointments. The patient expressed understanding and agreed to proceed.  History of Present Illness: Pt is home needing f/u from hosp for dehydration 1/14-1/21    She states she has been c/o falls and inc swelling and sob.  She is home alone with her 68 yo granddaughter .   No chest pain.     Observations/Objective: There were no vitals filed for this visit. Pt c/o sob  Assessment and Plan: 1. SOB (shortness of breath) Pt going to er for further evaluation   2. Lower extremity edema Elevate legs F/u er   3. Falls frequently  Pt/ot   4. 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 Drug-induced polyneuropathy (HCC) Stable   6. Major depressive disorder, single episode, moderate (HCC) Stable   7. DM (diabetes mellitus) type II uncontrolled, periph vascular disorder ([place]) Per endo   8. Morbid obesity due to excess calories ([place])   9. Generalized idiopathic epilepsy and epileptic syndromes, not intractable, with status epilepticus ([place])   10. Atherosclerotic heart disease of native coronary artery with other forms of angina pectoris ([place])    .Follow Up Instructions:    I discussed the assessment and treatment plan with the patient. The patient was provided an opportunity to ask questions and all were answered. The patient agreed with the plan and demonstrated an understanding of the instructions.   The patient was advised to call back or seek an in-person evaluation if the symptoms worsen or if the condition fails to improve as anticipated.  I provided 25 minutes of non-face-to-face  time during this encounter.   Ann Held, DO

## 2019-08-18 NOTE — Assessment & Plan Note (Signed)
Elevate ext F/u er  Pt is on eliquis due to dvt

## 2019-08-18 NOTE — Assessment & Plan Note (Signed)
Advised pt go to ER to be evaluated due to new sob and low ext edema

## 2019-08-18 NOTE — Plan of Care (Signed)
   Vital Signs MEWS/VS Documentation       08/17/2019 1735 08/17/2019 1848 08/17/2019 2000 08/18/2019 0300   MEWS Score:  1  0  0  0   MEWS Score Color:  Green  Green  Green  Green   Resp:  18  --  --  --   Pulse:  79  93  --  --   BP:  92/68  109/78  --  --   Temp:  98.3 F (36.8 C)  98.3 F (36.8 C)  --  --   O2 Device:  --  Room Air  --  --   Level of Consciousness:  --  --  Alert  Alert       POC reviewed with pt     Paticia Stack Gabbriella Presswood 08/18/2019,4:09 AM

## 2019-08-18 NOTE — Progress Notes (Signed)
VAST to bedside to assess chest port. Current access with blood clot in needle port x2. De-accessed port without difficulty. Pt applied lidocaine cream from home and covered with Tegaderm requesting VAST RN come back after 45 mins.

## 2019-08-19 ENCOUNTER — Other Ambulatory Visit: Payer: Self-pay | Admitting: *Deleted

## 2019-08-19 LAB — BASIC METABOLIC PANEL
Anion gap: 6 (ref 5–15)
BUN: 15 mg/dL (ref 8–23)
CO2: 28 mmol/L (ref 22–32)
Calcium: 8.2 mg/dL — ABNORMAL LOW (ref 8.9–10.3)
Chloride: 108 mmol/L (ref 98–111)
Creatinine, Ser: 1.32 mg/dL — ABNORMAL HIGH (ref 0.44–1.00)
GFR calc Af Amer: 48 mL/min — ABNORMAL LOW (ref >60)
GFR calc non Af Amer: 42 mL/min — ABNORMAL LOW (ref >60)
Glucose, Bld: 147 mg/dL — ABNORMAL HIGH (ref 70–99)
Potassium: 4.3 mmol/L (ref 3.5–5.1)
Sodium: 142 mmol/L (ref 135–145)

## 2019-08-19 LAB — CBC
HCT: 26 % — ABNORMAL LOW (ref 36.0–46.0)
Hemoglobin: 9 g/dL — ABNORMAL LOW (ref 12.0–15.0)
MCH: 28.7 pg (ref 26.0–34.0)
MCHC: 34.6 g/dL (ref 30.0–36.0)
MCV: 82.8 fL (ref 80.0–100.0)
Platelets: 243 10*3/uL (ref 150–400)
RBC: 3.14 MIL/uL — ABNORMAL LOW (ref 3.87–5.11)
RDW: 16.7 % — ABNORMAL HIGH (ref 11.5–15.5)
WBC: 10.3 10*3/uL (ref 4.0–10.5)
nRBC: 0.2 % (ref 0.0–0.2)

## 2019-08-19 LAB — GLUCOSE, CAPILLARY
Glucose-Capillary: 164 mg/dL — ABNORMAL HIGH (ref 70–99)
Glucose-Capillary: 78 mg/dL (ref 70–99)
Glucose-Capillary: 260 mg/dL — ABNORMAL HIGH (ref 70–99)
Glucose-Capillary: 271 mg/dL — ABNORMAL HIGH (ref 70–99)

## 2019-08-19 MED ORDER — TRAMADOL HCL 50 MG PO TABS: 100.0000 mg | ORAL_TABLET | Freq: Three times a day (TID) | ORAL | 0 refills | Status: DC | PRN

## 2019-08-19 MED ORDER — APIXABAN 5 MG PO TABS: 5.0000 mg | ORAL_TABLET | Freq: Two times a day (BID) | ORAL | Status: AC

## 2019-08-19 NOTE — Consult Note (Signed)
   [facility] Inpatient Consult   08/19/2019  MEA OZGA 1950-11-19 [id number]   [facility] ([place]) Active status:   Patient is currently active with [facility] attorney) Care Management prior to admission.She had previous engagements with [facility] care management coordinators in the past for community resources, disease management and medication assistance.   Patient has been activelyengaged by[facility] RN CM (who is aware of this admission) for post hospital complex disease management of DM as referred by primary care provider.   Patient will receive a post hospital call and will be evaluated for assessments and disease process educationif transitioning to home.  Patient was reviewed for50% extreme high risk score for unplanned readmission with 5 hospitalizations, 2 ED visits in the past 6 monthsand a 30 day readmission; as a benefit of her [facility] plan.  Review of medical recordand MD brief historyshow that patient: 69 y.o.female69 year old African-American female with a past medical history of diabetes mellitus type 2, hyperlipidemia, asthma, recent hospitalization for COVID-19 earlier in January, was rehospitalized on January 14 due to generalized weakness, falls, diarrhea, noted to be orthostatic and had acute on chronic kidney disease. She was seen by PT who recommended skilled nursing facility for rehab but patient declined and wanted to go home, she was then discharged home. Patient apparently lives by herself. She presented back to the ED on 08/17/19 complaining of leg swelling and shortness of breath (Failure to thrive/generalized weakness/frequent falls) and thinksthat she will need to go to [place] (SNF) for rehab.  Her primary care provider isDr.Yvonne Lowne-Chase, listed to provide transition of care.  Review of PT/ OTcompleted notes show that current recommendation is to discharge to rehab facility prior to discharge  home.  If there are anydisposition changes or needs for appropriate follow-up, please refer to [facility] care management.  Plan:  Will follow progress and disposition with inpatient TOC team and make aware that [facility] CM following. Will update [facility] RN CM of disposition plan.    For questions andadditional information,please contact:  Amandeep Hogston A. Brailyn Delman, BSN, RN-BC [facility] Liaison Cell: [phone]

## 2019-08-19 NOTE — Progress Notes (Signed)
Assisted mechanical fall: while standing from toilet, pt reports her knees buckled. Pt denies dizziness or being lightheaded.   08/19/19 1605 08/19/19 1610 08/19/19 [place]  Was fall witnessed? Yes  --   --   Who witnessed fall? Louann Liv, RN  --   --   Patients activity before fall bathroom-assisted  --   --   Point of contact other (comment) (knees)  --   --   Was patient injured? No  --   --   Follow Up  MD notified [place] Pink, MD  --   --   Time MD notified [phone]  --   --   Family notified No - patient refusal  --   --   Time family notified  (pt wishes to notify personally)  --   --   Additional tests No  --   --   Simple treatment Other (comment) (see MAR)  --   --   Progress note created (see row info) Yes  --   --   Adult Fall Risk Assessment  Risk Factor Category (scoring not indicated) History of more than one fall within 6 months before admission (document High fall risk);Fall has occurred during this admission (document High fall risk)  --   --   Patient Fall Risk Level High fall risk  --   --   Adult Fall Risk Interventions  Required Bundle Interventions *See Row Information* High fall risk - low, moderate, and high requirements implemented  --   --   Additional Interventions Pharmacy review of medications;PT/OT need assessed if change in mobility from baseline;Use of appropriate toileting equipment (bedpan, BSC, etc.)  --   --   Screening for Fall Injury Risk (To be completed on HIGH fall risk patients) - Assessing Need for Low Bed  Risk For Fall Injury- Low Bed Criteria Previous fall this admission  --   --   Will Implement Low Bed and Floor Mats Yes  --   --   Screening for Fall Injury Risk (To be completed on HIGH fall risk patients who do not meet crieteria for Low Bed) - Assessing Need for Floor Mats Only  Risk For Fall Injury- Criteria for Floor Mats None identified - No additional interventions needed  --   --   Will Implement Floor Mats Yes  --   --    Vitals  Temp  --   --  98.1 F (36.7 C)  Temp Source  --   --  Oral  BP  --   --  124/71  MAP (mmHg)  --   --  74  BP Location  --   --  Right Arm  BP Method  --   --  Automatic  Patient Position (if appropriate)  --   --  Lying  Pulse Rate  --   --  82  Cardiac Rhythm  --   --  NSR  Resp  --   --  18  Oxygen Therapy  SpO2  --   --  98 %  O2 Device  --   --  Room Air  Patient Activity (if Appropriate)  --   --  In bed  Pulse Oximetry Type  --   --  Continuous  Pain Assessment  Pain Scale  --  0-10  --   Pain Score  --  6  --   Pain Type  --  Acute pain  --   Pain Location  --  Knee  --   Pain Orientation  --  Left  --   Pain Intervention(s)  --  Medication (See eMAR)  --   Neurological  Neuro (WDL)  --  WDL WDL  Level of Consciousness  --  Alert Alert  Orientation Level  --  Oriented X4 Oriented X4  Cognition  --  Appropriate at baseline;Appropriate attention/concentration;Appropriate judgement;Appropriate safety awareness;Appropriate for developmental age;Follows commands;No memory impairment Appropriate at baseline;Appropriate attention/concentration;Appropriate judgement;Appropriate safety awareness;Appropriate for developmental age;Follows commands;No memory impairment  Speech  --  Clear Clear  Pupil Assessment   --  No No  Glasgow Coma Scale  Eye Opening  --  4  --   Best Verbal Response (NON-intubated)  --  5  --   Best Motor Response  --  6  --   Musculoskeletal  Musculoskeletal (WDL)  --  X  --   Generalized Weakness  --  Yes  --   Weight Bearing Restrictions  --  No  --   Integumentary  Integumentary (WDL)  --  X (no new findings)  --

## 2019-08-19 NOTE — Consult Note (Signed)
   [facility] Inpatient Consult   08/19/2019  [name] 1951-05-30 [id number]   Patient is currently active with [facility] for chronic disease management services.  Patient to be engaged by a [place] NP, Kayleen Memos. Please see notes for details.   Patient has [employer] in the [facility]  Patient with a readmission with less than 7 days. Patient went home with Home Health orders, 08/14/2019 and returned to ED on 08/16/2019.  MD notes includes but not limited to are as follows:  HPI: [name] is a 69 y.o. female 69 year old African-American female with a past medical history of diabetes mellitus type 2, hyperlipidemia, asthma, recent hospitalization for COVID-19 earlier in January was rehospitalized on January 14 due to generalized weakness, falls, diarrhea.  She had some abdominal pain and was found to have a periumbilical rash.  She was placed on antibiotics.  She was also noted to be orthostatic.  She also had acute on chronic kidney disease.  She was stabilized.  She was seen by PT who recommended skilled nursing facility for rehab.  Patient declined this and wanted to go home.  She was subsequently discharged home.  Patient apparently lives by herself.  Plan:  Follow for progress and disposition. Will update [facility] staff member of disposition.   Of note, [facility] Care Management services does not replace or interfere with any services that are needed or arranged by inpatient [facility] care management team.  For additional questions or referrals please contact:  [name], RN BSN [facility] Liaison  [phone] business mobile phone Toll free office [phone]  Fax number: [phone] [email or web address]

## 2019-08-19 NOTE — Progress Notes (Signed)
Inpatient Rehabilitation Admissions Coordinator  Inpatient rehab consult received. I will follow up with patient and family tomorrow to assist with planning dispo options for rehab. Noted they have refused SNF in the past as well as readmitted three times due to her post COVID issues. CIR beds limited.  Danne Baxter, RN, MSN Rehab Admissions Coordinator [phone] 08/19/2019 4:05 PM

## 2019-08-19 NOTE — Progress Notes (Signed)
PROGRESS NOTE  [name] EMV:[id number] DOB: 28-Feb-1951 DOA: 08/16/2019  PCP: Ann Held, DO  Brief History/Interval Summary: 69 y.o. female 69 year old African-American female with a past medical history of diabetes mellitus type 2, hyperlipidemia, asthma, recent hospitalization for COVID-19 earlier in January was rehospitalized on January 14 due to generalized weakness, falls, diarrhea.  She had some abdominal pain and was found to have a periumbilical rash.  She was placed on antibiotics.  She was also noted to be orthostatic.  She also had acute on chronic kidney disease.  She was stabilized.  She was seen by PT who recommended skilled nursing facility for rehab.  Patient declined this and wanted to go home.  She was subsequently discharged home.  Patient apparently lives by herself.  She presented back to the emergency department complaining of leg swelling and shortness of breath.  She says that she has been unable to get around the house due to the swelling.  She thinks that she will need to go to a skilled nursing facility for rehab.  She denies any shortness of breath currently.  No chest pain.  No nausea or vomiting.  She wants something to eat this morning.  She denies any abdominal pain currently.  She was subsequently hospitalized for further management.   Reason for Visit: Physical deconditioning.   Consultants: None  Procedures: None  Antibiotics: Anti-infectives (From admission, onward)   Start     Dose/Rate Route Frequency Ordered Stop   08/18/19 0800  levofloxacin (LEVAQUIN) tablet 750 mg     750 mg Oral Every 48 hours 08/17/19 1118     08/17/19 1100  levofloxacin (LEVAQUIN) tablet 750 mg  Status:  Discontinued     750 mg Oral Every 48 hours 08/17/19 1051 08/17/19 1118      Subjective/Interval History: Patient states that she is feeling better.  Denies any dizziness or lightheadedness.  No shortness of breath.  Urinating well.    Assessment/Plan:   Failure to thrive/generalized weakness/frequent falls This is mainly due to physical deconditioning from recent hospitalizations for COVID-19.  Skilled nursing facility was recommended the last time she was in the hospital however she declined and went home with home health.  Did not do well and came back to the ED.  Was found to have renal failure.  Now agreeable to go to skilled nursing facility for rehab.  PT and OT have been consulted.  TOC is consulted as well.    Recent COVID-19 pneumonia Seems to have improved.  She was treated with steroids.  Not given remdesivir during previous hospitalization.  Currently stable.  Saturating normal on room air.  Recent nausea vomiting and diarrhea Appears to have resolved.  Hypotension Blood pressure was noted to be borderline low.  Her antihypertensives including isosorbide mononitrate along with metoprolol were held.  Blood pressure has improved.  Continue to hold these drugs for now.    Diabetes mellitus type 2 in the setting of chronic kidney disease stage IIIb CBGs are reasonably well controlled.  Continue to monitor.  May resume Metformin at discharge.  Acute on chronic kidney disease stage IIIb Baseline creatinines around 1.4-1.5.  Was noted to be elevated at admission due to dehydration.  Improved with gentle IV hydration.  Continue to monitor urine output.  IV fluids were stopped.  Periumbilical rash Continue nystatin and levofloxacin.  Chronic pain syndrome/peripheral neuropathy Continue gabapentin and tramadol.  History of coronary artery disease Continue Plavix and statin.  Recent acute lower  extremity DVT Continue apixaban  Normocytic anemia Hemoglobin close to baseline.  No evidence for overt bleeding.  Continue to monitor.  Obesity Estimated body mass index is 30.99 kg/m as calculated from the following:   Height as of this encounter: 5' 6"  (1.676 m).   Weight as of this encounter: 87.1 kg.   DVT Prophylaxis: On  apixaban Code Status: Full code Family Communication: Daughter was updated earlier today Disposition Plan: Placement to skilled nursing facility for short-term rehab.   Medications:  Scheduled: . apixaban  5 mg Oral BID  . clopidogrel  75 mg Oral Daily  . darifenacin  7.5 mg Oral Daily  . docusate sodium  100 mg Oral BID  . gabapentin  400 mg Oral TID  . insulin aspart  0-20 Units Subcutaneous TID WC  . insulin aspart  0-5 Units Subcutaneous QHS  . levofloxacin  750 mg Oral Q48H  . multivitamin with minerals  1 tablet Oral Daily  . nystatin   Topical TID  . pantoprazole  40 mg Oral Daily  . rosuvastatin  20 mg Oral q1800  . sertraline  50 mg Oral Daily  . sucralfate  1 g Oral BID   Continuous:  GSU:PJSRPRXYVOPFY **OR** acetaminophen, albuterol, ondansetron **OR** ondansetron (ZOFRAN) IV, traMADol, traZODone   Objective:  Vital Signs  Vitals:   08/18/19 1611 08/18/19 2100 08/19/19 0447 08/19/19 0815  BP: (!) 99/56 (!) 96/57 96/72 100/70  Pulse: 72 88 90 88  Resp: 16 18 18 16   Temp: 97.9 F (36.6 C) 98.9 F (37.2 C) 98.2 F (36.8 C) 98.4 F (36.9 C)  TempSrc: Oral Oral Oral Oral  SpO2: 96% 100% 100% 99%  Weight:      Height:        Intake/Output Summary (Last 24 hours) at 08/19/2019 1152 Last data filed at 08/18/2019 2300 Gross per 24 hour  Intake 240 ml  Output -  Net 240 ml   Filed Weights   08/16/19 1810  Weight: 87.1 kg    General appearance: Awake alert.  In no distress Resp: Clear to auscultation bilaterally.  Normal effort Cardio: S1-S2 is normal regular.  No S3-S4.  No rubs murmurs or bruit GI: Abdomen is soft.  Nontender nondistended.  Bowel sounds are present normal.  No masses organomegaly Extremities: No edema.  Full range of motion of lower extremities. Neurologic: Alert and oriented x3.  No focal neurological deficits.      Lab Results:  Data Reviewed: I have personally reviewed following labs and imaging studies  CBC: Recent Labs   Lab 08/14/19 0500 08/16/19 2022 08/18/19 0638 08/19/19 0836  WBC 6.2 10.4 8.3 10.3  NEUTROABS 4.8 5.8  --   --   HGB 9.6* 9.3* 8.2* 9.0*  HCT 27.3* 26.9* 23.3* 26.0*  MCV 81.0 83.0 82.3 82.8  PLT 222 160 169 924    Basic Metabolic Panel: Recent Labs  Lab 08/14/19 0500 08/16/19 2220 08/18/19 0638 08/19/19 0836  NA 142 143 142 142  K 4.4 4.1 4.8 4.3  CL 111 112* 110 108  CO2 23 26 26 28   GLUCOSE 303* 160* 172* 147*  BUN 11 19 16 15   CREATININE 1.40* 1.89* 1.31* 1.32*  CALCIUM 8.0* 8.3* 7.6* 8.2*    GFR: Estimated Creatinine Clearance: 46 mL/min (A) (by C-G formula based on SCr of 1.32 mg/dL (H)).  Liver Function Tests: Recent Labs  Lab 08/16/19 2220  AST 30  ALT 55*  ALKPHOS 211*  BILITOT 0.9  PROT 5.4*  ALBUMIN 2.1*     CBG: Recent Labs  Lab 08/18/19 0728 08/18/19 1135 08/18/19 1642 08/18/19 2036 08/19/19 0750  GLUCAP 180* 70 149* 245* 164*     Recent Results (from the past 240 hour(s))  GI pathogen panel by PCR, stool     Status: Abnormal   Collection Time: 08/10/19  8:03 AM   Specimen: STOOL  Result Value Ref Range Status   Plesiomonas shigelloides NOT DETECTED NOT DETECTED Final   Yersinia enterocolitica NOT DETECTED NOT DETECTED Final   Vibrio NOT DETECTED NOT DETECTED Final   Enteropathogenic E coli DETECTED (A) NOT DETECTED Final   E coli (ETEC) LT/ST NOT DETECTED NOT DETECTED Final   E coli 2353 by PCR Not applicable NOT DETECTED Final   Cryptosporidium by PCR NOT DETECTED NOT DETECTED Final   Entamoeba histolytica NOT DETECTED NOT DETECTED Final   Adenovirus F 40/41 NOT DETECTED NOT DETECTED Final   Norovirus GI/GII NOT DETECTED NOT DETECTED Final   Sapovirus NOT DETECTED NOT DETECTED Final    Comment: (NOTE) Performed At: [facility] [address] [place], [place] [id number] Rush Farmer MD GQ:[id number]    Vibrio cholerae NOT DETECTED NOT DETECTED Final   Campylobacter by PCR NOT DETECTED NOT DETECTED Final    Salmonella by PCR NOT DETECTED NOT DETECTED Final   E coli (STEC) NOT DETECTED NOT DETECTED Final   Enteroaggregative E coli NOT DETECTED NOT DETECTED Final   Shigella by PCR NOT DETECTED NOT DETECTED Final   Cyclospora cayetanensis NOT DETECTED NOT DETECTED Final   Astrovirus NOT DETECTED NOT DETECTED Final   G lamblia by PCR NOT DETECTED NOT DETECTED Final   Rotavirus A by PCR NOT DETECTED NOT DETECTED Final  Culture, blood (routine x 2)     Status: None   Collection Time: 08/10/19  4:41 PM   Specimen: BLOOD  Result Value Ref Range Status   Specimen Description   Final    BLOOD RIGHT ANTECUBITAL Performed at [facility], [place] [address] [place], [place] [address]    Special Requests   Final    BOTTLES DRAWN AEROBIC AND ANAEROBIC Blood Culture adequate volume Performed at [facility] [address] [place], [place] [address]    Culture   Final    NO GROWTH 5 DAYS Performed at [facility], [place] [address] [place], [place] [address]    Report Status 08/15/2019 FINAL  Final  Culture, blood (routine x 2)     Status: None   Collection Time: 08/10/19  4:41 PM   Specimen: BLOOD  Result Value Ref Range Status   Specimen Description   Final    BLOOD RIGHT WRIST Performed at [facility] [address] [place], [place] [address]    Special Requests   Final    BOTTLES DRAWN AEROBIC ONLY Blood Culture adequate volume Performed at [facility] [address], [place], [place] [address]    Culture   Final    NO GROWTH 5 DAYS Performed at [facility], [place] [address] [place], [place] [address]    Report Status 08/15/2019 FINAL  Final  SARS Coronavirus 2 by RT PCR (hospital order, performed in [facility] hospital lab) Nasopharyngeal Nasopharyngeal Swab     Status: Abnormal   Collection Time: 08/16/19 10:39 PM   Specimen: Nasopharyngeal Swab  Result Value Ref Range Status   SARS Coronavirus  2 POSITIVE (A) NEGATIVE Final    Comment:  RESULT CALLED TO, READ BACK BY AND VERIFIED WITH: ADKINS,L,RN @ [id number] 08/17/19 BY GWYN,P (NOTE) SARS-CoV-2 target nucleic acids are DETECTED SARS-CoV-2 RNA is generally detectable in upper respiratory specimens  during the acute phase of infection.  Positive results are indicative  of the presence of the identified virus, but do not rule out bacterial infection or co-infection with other pathogens not detected by the test.  Clinical correlation with patient history and  other diagnostic information is necessary to determine patient infection status.  The expected result is negative. Fact Sheet for Patients:   StrictlyIdeas.no  Fact Sheet for Healthcare Providers:   BankingDealers.co.za   This test is not yet approved or cleared by the [place] FDA and  has been authorized for detection and/or diagnosis of SARS-CoV-2 by FDA under an Emergency Use Authorization (EUA).  This EUA will remain in effect (meaning this test can  be used) for the duration of  the COVID-19 declaration under Section 564(b)(1) of the Act, 21 U.S.C. section 360-bbb-3(b)(1), unless the authorization is terminated or revoked sooner. Performed at [facility], [address], [place], [place] [address]       Radiology Studies: No results found.     LOS: 2 days   [place] [facility] Pager on [email or web address]  08/19/2019, 11:52 AM

## 2019-08-20 LAB — GLUCOSE, CAPILLARY
Glucose-Capillary: 91 mg/dL (ref 70–99)
Glucose-Capillary: 102 mg/dL — ABNORMAL HIGH (ref 70–99)
Glucose-Capillary: 172 mg/dL — ABNORMAL HIGH (ref 70–99)
Glucose-Capillary: 197 mg/dL — ABNORMAL HIGH (ref 70–99)

## 2019-08-20 MED ORDER — TRAMADOL HCL 50 MG PO TABS: 100.0000 mg | ORAL_TABLET | Freq: Three times a day (TID) | ORAL | 0 refills | Status: DC | PRN

## 2019-08-20 MED ORDER — METOPROLOL TARTRATE 25 MG PO TABS
25.0000 mg | ORAL_TABLET | Freq: Two times a day (BID) | ORAL | Status: DC
  Administered 2019-08-20 – 2019-08-23 (×5): 25 mg via ORAL
  Filled 2019-08-20 (×7): qty 1

## 2019-08-20 NOTE — Progress Notes (Signed)
Nursing Home Choices, Call Daughter- [name] to review bed offers.  [place] [facility] [place], [place] [address] [phone] Overall rating Much below average 2. 1.4 mi [place] at [facility] [place], [place] [address] [phone] Overall rating Below average 3. 2 mi [facility] [place], [place] [address] [phone] Overall rating Much above average 4. 2.3 mi [place], [place] [address] [phone] Overall rating Below average 5. 2.6 [place] [address] [place], [place] [address] [phone] Overall rating Below average 6. 3.1 mi [place] at [place], [place] [address] [phone] Overall rating Below average 7. 3.2 mi [place] [facility] [place] [address] [place], [place] [address] [phone] Overall rating Much above average 8. 3.6 mi [place] [address] [place], [place] [address] [phone] Overall rating Much below average 9. 3.7 mi [facility] [place], [place] [address] [phone] Overall rating Much below average 10. 5.4 [place], [place] [address] [phone] Overall rating Below average 11. 5.6 mi [facility] [place], [place] [address] [phone] Overall rating Much above average 12. 6.1 mi [facility] [address] [place], [place] [address] [phone] Overall rating Much above average 13. 7.1 [place] [facility] [place], [place] [address] [phone] Overall rating Much below average 14. 7.2 mi [facility] [address] [place], [place] [address] [phone] Overall rating Average 15. 7.9 [place], [place] [address] [phone] Overall rating Above average

## 2019-08-20 NOTE — Care Management Important Message (Signed)
Important Message  Patient Details  Name: REECE MCBROOM MRN: [id number] Date of Birth: 1951/07/09   Medicare Important Message Given:  Yes - Important Message mailed due to current National Emergency  Verbal consent obtained due to current National Emergency  Relationship to patient: Child Contact Name: Aoife Bold Call Date: 08/20/19  Time: 1405 Phone: [phone] Outcome: No Answer/Busy Important Message mailed to: Patient address on file    Delorse Lek 08/20/2019, 2:05 PM

## 2019-08-20 NOTE — TOC Progression Note (Addendum)
Transition of Care [facility]) - Progression Note    Patient Details  Name: [name] MRN: [id number] Date of Birth: 06-10-1951  Transition of Care [facility]) CM/SW Contact  Boneta Lucks, RN Phone Number: 08/20/2019, 4:49 PM  Clinical Narrative:   Patient is medically ready. PT is recommending SNF. CM spoke with Alanna- daughter, TOC to follow up with bed offer when available. PASSR is pending , requested documents uploaded to [facility]. SNF referral sent out, pending bed offers.TOC to follow.     Expected Discharge Plan: [place] Barriers to Discharge: SNF Pending bed offer, Other (comment)(Pending PASSR review)  Expected Discharge Plan and Services Expected Discharge Plan: [place]         Expected Discharge Date: 08/20/19                   Readmission Risk Interventions Readmission Risk Prevention Plan 05/16/2019  Transportation Screening Complete  Medication Review ([place]) Complete  HRI or [place] Complete  SW Recovery Care/Counseling Consult Complete  [place] Not Applicable  Some recent data might be hidden

## 2019-08-20 NOTE — Plan of Care (Signed)
  Problem: Respiratory: Goal: Will maintain a patent airway Outcome: Progressing   

## 2019-08-20 NOTE — TOC Progression Note (Signed)
Transition of Care (TOC) -30 day Note     Patient Details  Name: [name] MRN: [id number] Date of Birth: 1950-10-28  [facility]) CM/SW Contact  Boneta Lucks, RN Phone Number: 08/20/2019, 4:02 PM   MUST ID  [id number]  To Whom it May Concern:  Please be advised that the above patient will require a short-term nursing home stay, anticipated 30 days or less rehabilitation and strengthening. The plan is for return home.

## 2019-08-20 NOTE — Progress Notes (Signed)
Physical Therapy Treatment Patient Details Name: [name] MRN: [id number] DOB: [birth year] Today's Date: 08/20/2019    History of Present Illness 69 y.o. female with past medical history significant for DM2, HLD, Asthma who presented to the ED for progressive cough, dyspnea over the past week (1/02). admitted to [facility] w/ COVID (dx 12/31) and d/c home (at her and family insistence)1/4. Readmitted to hosital w/ falls and COVID PNA on 1/14, d/c home once more 1/20. And returned to ED 1/22 w/ c/o BLE swelling and SOB.    PT Comments    Pleasant, cooperative and receptive to PT today. Is receptive to the suggestion of rehab, and awaits finalizing of discharge plan. She was on BS commode when PT entered room. Completed toileting with MIN Guard, close supervision- Per physician, advised to only ambulate to the BS chair from BS commode (BS chair was on other side of bed) due to increased HR- and per RN she is now to be put on Beta blockers.. Compression hose applied during PT, and per RN she is to wear them during the day only. Practiced all LE ex, 10 reps each as detailed. She achieved 1750 on the IS with good return demo. Should continue to benefit from PT. At end of session, she was up in [facility] chair, cell and hospital phone, call light all in reach; LEs elevated- BS table over her lap, blanket covering her.   Follow Up Recommendations  SNF;Supervision/Assistance - 24 hour;CIR     Equipment Recommendations  Other (comment)(She wants ROLLATOR and is not receptive to regular RW- has a rollator at home, but it is "old and not safe". She has been using RW while here)    Recommendations for Other Services       Precautions / Restrictions Precautions Precautions: Fall Precaution Comments: Has had a fall in last 24 hours- recorded- knee buckled (LLE) when in bathroom and recaps that it occured when she was trying to get up from commode Restrictions Weight Bearing Restrictions: No Other  Position/Activity Restrictions: Swelling significantly decreased in both LEs- has compression hose and these were applied during PT    Mobility  Bed Mobility Overal bed mobility: Modified Independent Bed Mobility: Supine to Sit;Sit to Supine Rolling: Supervision(Close Supervision)   Supine to sit: Supervision;Min guard Sit to supine: Supervision   General bed mobility comments: HOB elevated and use of bedrail  Transfers Overall transfer level: Needs assistance Equipment used: Rolling walker (2 wheeled)(States she has a rollator at home but that it is defective) Transfers: Sit to/from [facility] Sit to Stand: Supervision;Min guard Stand pivot transfers: Min guard;Supervision       General transfer comment: Improving when arising from low surface  Ambulation/Gait Ambulation/Gait assistance: Supervision;Min guard Gait Distance (Feet): 27 Feet(Physician requested that she not ambulate more than this due to elevated HR.) Assistive device: Rolling walker (2 wheeled) Gait Pattern/deviations: Step-through pattern;Decreased stride length;Trunk flexed     General Gait Details: She was cooperative and receptive to PT today. Anticipates discharge in the next 24-48 hours but states she has not heard anything definite about where she is going   Marine scientist [place] (Stroke Patients Only)       Balance Overall balance assessment: Needs assistance Sitting-balance support: Feet supported Sitting balance-Leahy Scale: Good     Standing balance support: During functional activity;Bilateral upper extremity supported Standing balance-Leahy Scale: Fair Standing balance comment: F+,  G-                            Cognition Arousal/Alertness: Awake/alert Behavior During Therapy: WFL for tasks assessed/performed Overall Cognitive Status: No family/caregiver present to determine baseline cognitive functioning                                  General Comments: Pt requires encouragement to engage in OOB activities      Exercises General Exercises - Lower Extremity Ankle Circles/Pumps: AROM;Seated;Both;10 reps Quad Sets: AROM;Seated;Both;10 reps Gluteal Sets: AROM;Seated;Both;10 reps Hip ABduction/ADduction: AROM;Seated;Both;10 reps Straight Leg Raises: AROM;Seated Other Exercises Other Exercises: She as able to achieve 1500, 8 out of 10 reps, and 1750 for 2 reps    General Comments        Pertinent Vitals/Pain Pain Assessment: No/denies pain    Home Living                      Prior Function            PT Goals (current goals can now be found in the care plan section) Acute Rehab PT Goals Patient Stated Goal: Agreeable to sitting up in the chair. PT Goal Formulation: With patient Time For Goal Achievement: 08/31/19 Potential to Achieve Goals: Good Progress towards PT goals: Progressing toward goals    Frequency    Min 2X/week      PT Plan Current plan remains appropriate    Co-evaluation              AM-PAC PT "6 Clicks" Mobility   Outcome Measure    Help needed moving from lying on your back to sitting on the side of a flat bed without using bedrails?: None Help needed moving to and from a bed to a chair (including a wheelchair)?: A Little Help needed standing up from a chair using your arms (e.g., wheelchair or bedside chair)?: A Little Help needed to walk in hospital room?: A Little Help needed climbing 3-5 steps with a railing? : A Lot 6 Click Score: 15    End of Session   Activity Tolerance: Patient tolerated treatment well Patient left: in chair;with call bell/phone within reach Nurse Communication: Mobility status PT Visit Diagnosis: Unsteadiness on feet (R26.81);Other abnormalities of gait and mobility (R26.89);Muscle weakness (generalized) (M62.81)     Time: 0340-3524 PT Time Calculation (min) (ACUTE ONLY): 43 min  Charges:   $Gait Training: 8-22 mins $Therapeutic Exercise: 8-22 mins $Therapeutic Activity: 8-22 mins                   Rollen Sox, PT # [phone] CGV cell   Casandra Doffing 08/20/2019, 10:12 AM

## 2019-08-20 NOTE — Progress Notes (Signed)
Inpatient Rehabilitation Admissions Coordinator  Patient will need SNF level rehab for prolonged recuperation before returning home. Noted readmitted 3 times due to lack of assistance available at home. SNF recommended. I have alerted Dr. Maryland Pink and SW, Nunzio Cory.  Danne Baxter, RN, MSN Rehab Admissions Coordinator [phone] 08/20/2019 10:42 AM

## 2019-08-20 NOTE — Discharge Summary (Addendum)
[facility]  Physician Discharge Summary   Patient ID: [name] MRN: [id number] DOB/AGE: 68-Mar-1952 46 y.o.  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 Monitor blood pressures at the skilled nursing facility on a daily basis. 2. CBC and basic metabolic panel on a weekly basis 3. Palliative medicine consult at skilled nursing facility requested by her PCP due to recent multiple hospitalizations.    Home Health: None Equipment/Devices: None  CODE STATUS: Full code  DISCHARGE CONDITION: fair  Diet recommendation: Modified carbohydrate  INITIAL HISTORY: 69 y.o.female25 year old African-American female with a past medical history of diabetes mellitus type 2, hyperlipidemia, asthma, recent hospitalization for COVID-19 earlier in January was rehospitalized on January 14 due to generalized weakness, falls, diarrhea. She had some abdominal pain and was found to have a periumbilical rash. She was placed on antibiotics. She was also noted to be orthostatic. She also had acute on chronic kidney disease. She was stabilized. She was seen by PT who recommended skilled nursing facility for rehab. Patient declined this and wanted to go home. She was subsequently discharged home. Patient apparently lives by herself.  She presented back to the emergency department complaining of leg swelling and shortness of breath. She says that she has been unable to get around the house due to the swelling. She thinks that she  will need to go to [facility] facility for rehab. She denies any shortness of breath currently. No chest pain. No nausea or vomiting. She wants something to eat this morning. She denies any abdominal pain currently.  She was subsequently hospitalized for further management.   HOSPITAL COURSE:   Failure to thrive/generalized weakness/frequent falls This is mainly due to physical deconditioning from recent hospitalizations for COVID-19.  Skilled nursing facility was recommended the last time she was in the hospital however she declined and went home with home health.  Did not do well and came back to the ED.  Was found to have renal failure.  Now agreeable to go to skilled nursing facility for rehab.    Recent COVID-19 pneumonia Seems to have improved.  She was treated with steroids.  Not given remdesivir during previous hospitalization. Saturating normal on room air.  Recent nausea vomiting and diarrhea Appears to have resolved.  Hypotension Blood pressure was noted to be borderline low.  Her antihypertensives including isosorbide mononitrate along with metoprolol were held.  Blood pressure has improved.    Noted to be tachycardic this morning which could be rebound tachycardia from being off of beta-blocker.  We have reinitiated her metoprolol.  Diabetes mellitus type 2 in the setting of chronic kidney disease stage IIIb CBGs are reasonably well controlled.  May resume Metformin at discharge.  Acute on chronic kidney disease stage IIIb Baseline creatinines around 1.4-1.5.  Was noted to be elevated at admission due to dehydration.  Improved with gentle IV hydration.  Continue to monitor urine output.  IV fluids were stopped.  Periumbilical rash Completed course of levofloxacin.  Chronic pain syndrome/peripheral neuropathy Continue gabapentin and tramadol.  History of coronary artery disease Continue Plavix and statin.  Recent acute lower extremity DVT Continue  apixaban  Normocytic anemia Hemoglobin close to baseline.  No evidence for overt bleeding.  Continue to monitor.  Obesity Estimated body mass index is 30.99 kg/m as calculated from the following:   Height as of this encounter: 5' 6"  (1.676 m).   Weight as of this encounter: 87.1 kg.  Overall stable.  Okay for discharge to skilled nursing facility today.   PERTINENT LABS:  The results of significant diagnostics from this hospitalization (including imaging, microbiology, ancillary and laboratory) are listed below for reference.    Microbiology: Recent Results (from the past 240 hour(s))  Culture, blood (routine x 2)     Status: None   Collection Time: 08/10/19  4:41 PM   Specimen: BLOOD  Result Value Ref Range Status   Specimen Description   Final    BLOOD RIGHT ANTECUBITAL Performed at [facility] [address] [place], [place] [address]    Special Requests   Final    BOTTLES DRAWN AEROBIC AND ANAEROBIC Blood Culture adequate volume Performed at [facility] [address], [place], [place] [address]    Culture   Final    NO GROWTH 5 DAYS Performed at [facility], [place] [address] [place], [place] [address]    Report Status 08/15/2019 FINAL  Final  Culture, blood (routine x 2)     Status: None   Collection Time: 08/10/19  4:41 PM   Specimen: BLOOD  Result Value Ref Range Status   Specimen Description   Final    BLOOD RIGHT WRIST Performed at [facility] [address] [place], [place] [address]    Special Requests   Final    BOTTLES DRAWN AEROBIC ONLY Blood Culture adequate volume Performed at [facility] [address] [place], [place] [address]    Culture   Final    NO GROWTH 5 DAYS Performed at [facility], [place] [address] [place], [place] [address]    Report Status 08/15/2019 FINAL  Final  SARS Coronavirus 2 by RT PCR (hospital order, performed in [facility]  hospital lab) Nasopharyngeal Nasopharyngeal Swab     Status: Abnormal   Collection Time: 08/16/19 10:39 PM   Specimen: Nasopharyngeal Swab  Result Value Ref Range Status   SARS Coronavirus 2 POSITIVE (A) NEGATIVE Final    Comment: RESULT CALLED TO, READ BACK BY AND VERIFIED WITH: ADKINS,L,RN @ 7124 08/17/19 BY GWYN,P (NOTE) SARS-CoV-2 target nucleic acids are DETECTED SARS-CoV-2 RNA is generally detectable in upper respiratory specimens  during the acute phase of infection.  Positive results are indicative  of the presence of the identified virus, but do not rule out bacterial infection or co-infection with other pathogens not detected by the test.  Clinical correlation with patient history and  other diagnostic information is necessary to determine patient infection status.  The expected result is negative. Fact Sheet for Patients:   StrictlyIdeas.no  Fact Sheet for Healthcare Providers:   BankingDealers.co.za   This test is not yet approved or cleared by the [place] FDA and  has been authorized for detection and/or diagnosis of SARS-CoV-2 by FDA under an Emergency Use Authorization (EUA).  This EUA will remain in effect (meaning this test can  be used) for the duration of  the COVID-19 declaration under Section 564(b)(1) of the Act, 21 U.S.C. section 360-bbb-3(b)(1), unless the authorization is terminated or revoked sooner. Performed at [facility], [address], [place], [place] [address]  Labs:    Basic Metabolic Panel: Recent Labs  Lab 08/14/19 0500 08/16/19 2220 08/18/19 0638 08/19/19 0836  NA 142 143 142 142  K 4.4 4.1 4.8 4.3  CL 111 112* 110 108  CO2 23 26 26 28   GLUCOSE 303* 160* 172* 147*  BUN 11 19 16 15   CREATININE 1.40* 1.89* 1.31* 1.32*  CALCIUM 8.0* 8.3* 7.6* 8.2*   Liver Function Tests: Recent Labs  Lab 08/16/19 2220  AST 30  ALT 55*  ALKPHOS 211*  BILITOT 0.9  PROT 5.4*    ALBUMIN 2.1*   CBC: Recent Labs  Lab 08/14/19 0500 08/16/19 2022 08/18/19 0638 08/19/19 0836  WBC 6.2 10.4 8.3 10.3  NEUTROABS 4.8 5.8  --   --   HGB 9.6* 9.3* 8.2* 9.0*  HCT 27.3* 26.9* 23.3* 26.0*  MCV 81.0 83.0 82.3 82.8  PLT 222 160 169 243   BNP: BNP (last 3 results) Recent Labs    08/16/19 2022  BNP 76.9    CBG: Recent Labs  Lab 08/19/19 1205 08/19/19 1621 08/19/19 2003 08/20/19 0909 08/20/19 1225  GLUCAP 78 260* 271* 91 102*     IMAGING STUDIES  DG Chest Portable 1 View  Result Date: 08/16/2019 CLINICAL DATA:  Hypoxia. EXAM: PORTABLE CHEST 1 VIEW COMPARISON:  August 08, 2019 FINDINGS: There is stable right-sided venous Port-A-Cath positioning. There is no evidence of acute infiltrate, pleural effusion or pneumothorax. The heart size and mediastinal contours are within normal limits. Degenerative changes seen within the thoracic spine. IMPRESSION: No active disease. Electronically Signed   By: Virgina Norfolk M.D.   On: 08/16/2019 19:06     DISCHARGE EXAMINATION: Vitals:   08/20/19 0802 08/20/19 0808 08/20/19 0945 08/20/19 0959  BP:  121/70 129/71   Pulse:  79 (!) 108   Resp:  16    Temp:  98.5 F (36.9 C)    TempSrc:  Oral    SpO2: 95% 95%  96%  Weight:      Height:       General appearance: Awake alert.  In no distress Resp: Clear to auscultation bilaterally.  Normal effort Cardio: S1-S2 noted to be tachycardic regular.  No S3-S4. GI: Abdomen is soft.  Nontender nondistended.  Bowel sounds are present normal.  No masses organomegaly Extremities: No edema.  Full range of motion of lower extremities. Neurologic: Alert and oriented x3.  No focal neurological deficits.    DISPOSITION: SNF  Discharge Instructions    Call MD for:  difficulty breathing, headache or visual disturbances   Complete by: As directed    Call MD for:  extreme fatigue   Complete by: As directed    Call MD for:  persistant dizziness or light-headedness   Complete  by: As directed    Call MD for:  persistant nausea and vomiting   Complete by: As directed    Call MD for:  severe uncontrolled pain   Complete by: As directed    Call MD for:  temperature >100.4   Complete by: As directed    Discharge instructions   Complete by: As directed    Please review instructions on the discharge summary as well.  You were cared for by a hospitalist during your hospital stay. If you have any questions about your discharge medications or the care you received while you were in the hospital after you are discharged, you can call the unit and asked to speak with the hospitalist on call if the hospitalist that took  care of you is not available. Once you are discharged, your primary care physician will handle any further medical issues. Please note that NO REFILLS for any discharge medications will be authorized once you are discharged, as it is imperative that you return to your primary care physician (or establish a relationship with a primary care physician if you do not have one) for your aftercare needs so that they can reassess your need for medications and monitor your lab values. If you do not have a primary care physician, you can call [phone] for a physician referral.   Increase activity slowly   Complete by: As directed         Allergies as of 08/20/2019      Reactions   Cephalexin Itching, Rash, Other (See Comments)   Sensation of throat closing   Adhesive [tape] Other (See Comments)   Burn Skin   Codeine Other (See Comments)   paranoid   Zocor [simvastatin - High Dose] Other (See Comments)   Muscle spasms   Penicillins Rash   Has patient had a PCN reaction causing immediate rash, facial/tongue/throat swelling, SOB or lightheadedness with hypotension: Yes Has patient had a PCN reaction causing severe rash involving mucus membranes or skin necrosis: No Has patient had a PCN reaction that required hospitalization does not remember.  Has patient had a  PCN reaction occurring within the last 10 years: childhood If all of the above answers are "NO", then may proceed with Cephalosporin use.      Medication List    STOP taking these medications   B-D UF III MINI PEN NEEDLES 31G X 5 MM Misc Generic drug: Insulin Pen Needle   Eliquis DVT/PE Starter Pack 5 MG Tbpk Generic drug: Apixaban Starter Pack Replaced by: apixaban 5 MG Tabs tablet   isosorbide mononitrate 30 MG 24 hr tablet Commonly known as: IMDUR   levofloxacin 750 MG tablet Commonly known as: LEVAQUIN   predniSONE 20 MG tablet Commonly known as: Deltasone     TAKE these medications   albuterol (2.5 MG/3ML) 0.083% nebulizer solution Commonly known as: PROVENTIL Take 3 mLs (2.5 mg total) by nebulization every 6 (six) hours as needed for wheezing or shortness of breath.   albuterol 108 (90 Base) MCG/ACT inhaler Commonly known as: VENTOLIN HFA Inhale 2 puffs into the lungs every 6 (six) hours as needed for wheezing or shortness of breath.   apixaban 5 MG Tabs tablet Commonly known as: ELIQUIS Take 1 tablet (5 mg total) by mouth 2 (two) times daily. Replaces: Eliquis DVT/PE Starter Pack 5 MG Tbpk   Centrum Silver Adult 50+ Tabs Take 1 tablet once a day What changed:   how much to take  how to take this  when to take this   clopidogrel 75 MG tablet Commonly known as: PLAVIX Take 1 tablet (75 mg total) by mouth daily.   darifenacin 7.5 MG 24 hr tablet Commonly known as: ENABLEX Take 7.5 mg by mouth daily.   feeding supplement (GLUCERNA SHAKE) Liqd Take 237 mLs by mouth 3 (three) times daily between meals.   gabapentin 400 MG capsule Commonly known as: NEURONTIN Take 1 capsule (400 mg total) by mouth 3 (three) times daily.   glucose blood test strip Commonly known as: OneTouch Verio Use as instructed to check blood sugar 3 times a day   ipratropium-albuterol 0.5-2.5 (3) MG/3ML Soln Commonly known as: DUONEB Take 3 mLs by nebulization every 6 (six)  hours as needed. What changed: reasons to take  this   lidocaine-prilocaine cream Commonly known as: EMLA Apply 1 application topically as needed. What changed: reasons to take this   metFORMIN 500 MG tablet Commonly known as: GLUCOPHAGE Take 1 tablet (500 mg total) by mouth 2 (two) times daily with a meal.   metoprolol tartrate 25 MG tablet Commonly known as: LOPRESSOR Take 0.5 tablets (12.5 mg total) by mouth 2 (two) times daily.   nitroGLYCERIN 0.4 MG SL tablet Commonly known as: Nitrostat Place 1 tablet (0.4 mg total) under the tongue every 5 (five) minutes as needed. What changed: reasons to take this   nystatin powder Commonly known as: MYCOSTATIN/NYSTOP Apply topically 3 (three) times daily.   pantoprazole 40 MG tablet Commonly known as: PROTONIX Take 1 tablet (40 mg total) by mouth daily.   rosuvastatin 20 MG tablet Commonly known as: CRESTOR Take 1 tablet (20 mg total) by mouth daily at 6 PM.   sertraline 50 MG tablet Commonly known as: ZOLOFT Take 1 tablet (50 mg total) by mouth daily.   sucralfate 1 g tablet Commonly known as: CARAFATE TAKE 1 TABLET BY MOUTH TWICE DAILY   traMADol 50 MG tablet Commonly known as: ULTRAM Take 2 tablets (100 mg total) by mouth 3 (three) times daily as needed for moderate pain.   traZODone 50 MG tablet Commonly known as: DESYREL Take 0.5 tablets (25 mg total) by mouth at bedtime as needed for sleep.        Follow-up Information    Ann Held, DO. Schedule an appointment as soon as possible for a visit in 2 week(s).   Specialty: Family Medicine Contact information: [address] [place] [address] [phone]           TOTAL DISCHARGE TIME: 10 minutes  [facility] Pager on [email or web address]  08/20/2019, 1:08 PM

## 2019-08-20 NOTE — Progress Notes (Signed)
   08/20/19 0910  Family/Significant Other Communication  Family/Significant Other Update Called;Updated (daughter Altha Harm Ph [phone])  Updated daughter with patients status and plan of care.

## 2019-08-20 NOTE — NC FL2 (Signed)
[place] LEVEL OF CARE SCREENING TOOL     IDENTIFICATION  Patient Name: [name] Birthdate: 1951/02/21 Sex: female Admission Date (Current Location): 08/16/2019  [facility] and [place] Number:  Herbalist and Address:  [facility])      Provider Number: [id number]  Attending Physician Name and Address:  Bonnielee Haff, MD  Relative Name and Phone Number:  Valkyrie Guardiola - daughter   [phone]    Current Level of Care: Hospital Recommended Level of Care: [place] Prior Approval Number:    Date Approved/Denied:   PASRR Number: Pending  Discharge Plan: SNF    Current Diagnoses: Patient Active Problem List   Diagnosis Date Noted  . Drug-induced polyneuropathy ([place]) 08/18/2019  . FTT (failure to thrive) in adult 08/17/2019  . AKI (acute kidney injury) ([place]) 08/17/2019  . SOB (shortness of breath) 08/16/2019  . Lower extremity edema 08/16/2019  . Fall 08/08/2019  . Hypertension 08/08/2019  . COVID-19 virus infection 07/25/2019  . Elevated LFTs 07/25/2019  . COVID-19 07/25/2019  . Allergic contact dermatitis due to adhesives 05/23/2019  . Insomnia 05/23/2019  . SIRS (systemic inflammatory response syndrome) ([place]) 05/14/2019  . NSTEMI (non-ST elevated myocardial infarction) ([place]) 05/14/2019  . Hyperglycemia   . Neutropenia ([place])   . CAD (coronary artery disease)   . Obesity with serious comorbidity 04/16/2019  . Gastroenteritis 03/27/2019  . Falls frequently 03/27/2019  . Acute gastroenteritis 03/22/2019  . Hematoma of scalp 03/14/2019  . Vitamin D deficiency 11/09/2018  . Muscle spasm 10/19/2018  . Chest pain 09/07/2018  . Palpitations 09/07/2018  . Other fatigue 09/07/2018  . Acute ST elevation myocardial infarction (STEMI) of inferior wall ([place]) 08/27/2018  . Atherosclerotic heart disease of native coronary artery with other forms of angina pectoris ([place]) 08/27/2018  . Acute vaginitis 08/20/2018  . Hyperlipidemia  associated with type 2 diabetes mellitus ([place]) 04/09/2018  . Bronchitis 11/02/2017  . Sepsis ([place]) 11/02/2017  . Leucocytosis 11/02/2017  . Moderate persistent asthma with acute exacerbation 05/26/2017  . Influenza A 05/26/2017  . Acute pain of right shoulder 03/07/2017  . Acute bilateral low back pain without sciatica 03/07/2017  . Chronic pain 03/07/2017  . Fatty liver 03/24/2016  . Weakness 03/23/2016  . Unexplained weight loss 03/23/2016  . Melena 03/23/2016  . Hypotension 02/03/2016  . Syncope 02/03/2016  . Hypomagnesemia 02/03/2016  . Acute kidney injury superimposed on chronic kidney disease ([place]) 02/03/2016  . Gastrointestinal dysmotility 01/20/2016  . CKD (chronic kidney disease) stage 3, GFR 30-59 ml/min (H[facility]) 01/20/2016  . Abscess of right thigh 01/20/2016  . Chronic pain syndrome 01/15/2016  . Diarrhea 01/15/2016  . Generalized abdominal pain 01/14/2016  . Type 2 diabetes with nephropathy ([place]) 12/30/2015  . TIA (transient ischemic attack) 07/2015  . Morbid obesity due to excess calories ([place]) 05/05/2015  . Diabetic polyneuropathy associated with type 2 diabetes mellitus ([place]) 01/02/2015  . Acute bacterial sinusitis 01/02/2015  . Onychomycosis 11/04/2014  . Pain in lower limb 11/04/2014  . Multinodular goiter (nontoxic) 10/16/2014  . History of CVA (cerebrovascular accident) 08/11/2014  . Iron deficiency anemia 05/20/2014  . Major depressive disorder, single episode, moderate ([place]) 05/20/2014  . Dizziness of unknown cause 04/28/2014  . MDD (major depressive disorder), recurrent episode, moderate ([place]) 01/28/2014  . GERD (gastroesophageal reflux disease) 01/02/2014  . Nausea 10/15/2012  . Stomach cancer ([place]) 12/10/2008  . CHRONIC RESPIRATORY FAILURE 01/03/2008  . Hyperlipidemia 06/27/2007  . OBESITY, MORBID 06/27/2007  . SLEEP APNEA,  OBSTRUCTIVE 06/27/2007  . Generalized idiopathic epilepsy and epileptic syndromes, not intractable, with status epilepticus ([place])  06/27/2007  . Asthma 06/27/2007  . OSTEOARTHRITIS  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 06/27/2007    Orientation RESPIRATION BLADDER Height & Weight     Self, Time, Situation, Place  Normal External catheter Weight: 87.1 kg Height:  5' 6"  (167.6 cm)  BEHAVIORAL SYMPTOMS/MOOD NEUROLOGICAL BOWEL NUTRITION STATUS      Continent Diet(See DC summary)  AMBULATORY STATUS COMMUNICATION OF NEEDS Skin   Extensive Assist Verbally Skin abrasions, Bruising(abrasion/Rash to abdomen  Skin tear to buttock)                       Personal Care Assistance Level of Assistance  Bathing, Dressing, Feeding Bathing Assistance: Limited assistance Feeding assistance: Limited assistance Dressing Assistance: Limited assistance     Functional Limitations Info  Sight, Speech, Hearing Sight Info: Adequate Hearing Info: Adequate Speech Info: Adequate    SPECIAL CARE FACTORS FREQUENCY  PT (By licensed PT)                    Contractures Contractures Info: Not present    Additional Factors Info  Code Status, Allergies Code Status Info: FULL Allergies Info: cephalexin, adhesive tape, codeine, zocor, penicillin           Current Medications (08/20/2019):  This is the current hospital active medication list Current Facility-Administered Medications  Medication Dose Route Frequency Provider Last Rate Last Admin  . acetaminophen (TYLENOL) tablet 650 mg  650 mg Oral Q6H PRN Bonnielee Haff, MD   650 mg at 08/19/19 1617   Or  . acetaminophen (TYLENOL) suppository 650 mg  650 mg Rectal Q6H PRN Bonnielee Haff, MD      . albuterol (VENTOLIN HFA) 108 (90 Base) MCG/ACT inhaler 2 puff  2 puff Inhalation Q6H PRN Bonnielee Haff, MD   2 puff at 08/17/19 2154  . apixaban (ELIQUIS) tablet 5 mg  5 mg Oral BID Alvira Philips, [place]   5 mg at 08/20/19 0915  . clopidogrel (PLAVIX) tablet 75 mg  75 mg Oral Daily Bonnielee Haff, MD   75 mg at 08/20/19 0915  . darifenacin (ENABLEX) 24 hr tablet 7.5 mg  7.5 mg  Oral Daily Bonnielee Haff, MD   7.5 mg at 08/20/19 0934  . docusate sodium (COLACE) capsule 100 mg  100 mg Oral BID Bonnielee Haff, MD   100 mg at 08/20/19 0915  . gabapentin (NEURONTIN) capsule 400 mg  400 mg Oral TID Bonnielee Haff, MD   400 mg at 08/20/19 0915  . insulin aspart (novoLOG) injection 0-20 Units  0-20 Units Subcutaneous TID WC Bonnielee Haff, MD   11 Units at 08/19/19 1710  . insulin aspart (novoLOG) injection 0-5 Units  0-5 Units Subcutaneous QHS Bonnielee Haff, MD   3 Units at 08/19/19 2105  . levofloxacin (LEVAQUIN) tablet 750 mg  750 mg Oral Q48H Bonnielee Haff, MD   750 mg at 08/20/19 0755  . metoprolol tartrate (LOPRESSOR) tablet 25 mg  25 mg Oral BID Bonnielee Haff, MD   25 mg at 08/20/19 0945  . multivitamin with minerals tablet 1 tablet  1 tablet Oral Daily Bonnielee Haff, MD   1 tablet at 08/20/19 0915  . nystatin (MYCOSTATIN/NYSTOP) topical powder   Topical TID Bonnielee Haff, MD   Given at 08/20/19 [phone]  . ondansetron (ZOFRAN) tablet 4 mg  4 mg Oral Q6H PRN Bonnielee Haff, MD  Or  . ondansetron (ZOFRAN) injection 4 mg  4 mg Intravenous Q6H PRN Bonnielee Haff, MD      . pantoprazole (PROTONIX) EC tablet 40 mg  40 mg Oral Daily Bonnielee Haff, MD   40 mg at 08/20/19 0915  . rosuvastatin (CRESTOR) tablet 20 mg  20 mg Oral q1800 Bonnielee Haff, MD   20 mg at 08/19/19 1710  . sertraline (ZOLOFT) tablet 50 mg  50 mg Oral Daily Bonnielee Haff, MD   50 mg at 08/20/19 0915  . sucralfate (CARAFATE) tablet 1 g  1 g Oral BID Bonnielee Haff, MD   1 g at 08/20/19 0943  . traMADol (ULTRAM) tablet 100 mg  100 mg Oral TID PRN Bonnielee Haff, MD   100 mg at 08/20/19 0025  . traZODone (DESYREL) tablet 25 mg  25 mg Oral QHS PRN Bonnielee Haff, MD       Facility-Administered Medications Ordered in Other Encounters  Medication Dose Route Frequency Provider Last Rate Last Admin  . sodium chloride flush (NS) 0.9 % injection 10 mL  10 mL Intravenous PRN [place], Holli Humbles, NP   10 mL at 09/13/16 1146     Discharge Medications: Please see discharge summary for a list of discharge medications.  Relevant Imaging Results:  Relevant Lab Results:   Additional Information SS# [id number]  Boneta Lucks, RN

## 2019-08-21 DIAGNOSIS — N1832 Chronic kidney disease, stage 3b: Secondary | ICD-10-CM | POA: Diagnosis not present

## 2019-08-21 DIAGNOSIS — G894 Chronic pain syndrome: Secondary | ICD-10-CM | POA: Diagnosis not present

## 2019-08-21 DIAGNOSIS — I25118 Atherosclerotic heart disease of native coronary artery with other forms of angina pectoris: Secondary | ICD-10-CM | POA: Diagnosis not present

## 2019-08-21 DIAGNOSIS — E1142 Type 2 diabetes mellitus with diabetic polyneuropathy: Secondary | ICD-10-CM | POA: Diagnosis not present

## 2019-08-21 DIAGNOSIS — N179 Acute kidney failure, unspecified: Secondary | ICD-10-CM | POA: Diagnosis not present

## 2019-08-21 DIAGNOSIS — R627 Adult failure to thrive: Secondary | ICD-10-CM | POA: Diagnosis not present

## 2019-08-21 LAB — GLUCOSE, CAPILLARY
Glucose-Capillary: 95 mg/dL (ref 70–99)
Glucose-Capillary: 112 mg/dL — ABNORMAL HIGH (ref 70–99)
Glucose-Capillary: 136 mg/dL — ABNORMAL HIGH (ref 70–99)
Glucose-Capillary: 151 mg/dL — ABNORMAL HIGH (ref 70–99)

## 2019-08-21 MED ORDER — CHLORHEXIDINE GLUCONATE CLOTH 2 % EX PADS
6.0000 | MEDICATED_PAD | Freq: Every day | CUTANEOUS | Status: DC
  Administered 2019-08-21 – 2019-08-23 (×3): 6 via TOPICAL

## 2019-08-21 MED ORDER — SODIUM CHLORIDE 0.9% FLUSH
10.0000 mL | INTRAVENOUS | Status: DC | PRN
  Administered 2019-08-23: 10 mL

## 2019-08-21 MED ORDER — SODIUM CHLORIDE 0.9% FLUSH
10.0000 mL | Freq: Two times a day (BID) | INTRAVENOUS | Status: DC
  Administered 2019-08-21 – 2019-08-23 (×5): 10 mL

## 2019-08-21 MED ORDER — ONDANSETRON HCL 4 MG PO TABS: 4.0000 mg | ORAL_TABLET | Freq: Four times a day (QID) | ORAL | 0 refills | Status: DC | PRN

## 2019-08-21 NOTE — Plan of Care (Signed)
  Problem: Respiratory: Goal: Will maintain a patent airway Outcome: Progressing   Problem: Coping: Goal: Psychosocial and spiritual needs will be supported Outcome: Progressing

## 2019-08-21 NOTE — Progress Notes (Addendum)
Patient complaints of nausea and upset stomach. Patient had 1 episode of vomiting of undigested food from breakfast. IV zofran administered. [place] provided. Will continue to monitor.

## 2019-08-21 NOTE — Progress Notes (Signed)
   08/21/19 1100  Family/Significant Other Communication  Family/Significant Other Update Called;Updated (daughter Altha Harm)  called and updated daughter Altha Harm with patient progress and plan of care (ph: [phone]). RN phone number provided for family to call if needed.

## 2019-08-21 NOTE — TOC Progression Note (Signed)
Transition of Care [facility]) - Progression Note    Patient Details  Name: [name] MRN: [id number] Date of Birth: [birth year]  Transition of Care [facility]) CM/SW Contact  Shade Flood, LCSW Phone Number: 08/21/2019, 2:27 PM  Clinical Narrative:     TOC following. Updated pt and daughter on current bed offer status. [place] will need to get authorization if pt agrees to go there as they do not have confidence in [employer]'s waiver. [facility] would take pt today with the insurance auth waiver. Pt reluctant to go to [facility] due to distance. She is reluctant to go to [facility] as well. Pt's daughter asked TOC to contact some other facilities in [place]. Have done this and awaiting response.  Pt informed this LCSW that she feels nauseated and has been vomiting so she isn't sure she is ready to be discharged today.  Messaged MD and RN to update on [facility] progress/lack thereof. Will follow.  Expected Discharge Plan: [place] Barriers to Discharge: SNF Pending bed offer, Other (comment)(Pending PASSR review)  Expected Discharge Plan and Services Expected Discharge Plan: [place]         Expected Discharge Date: 08/20/19                                     Social Determinants of Health (SDOH) Interventions    Readmission Risk Interventions Readmission Risk Prevention Plan 05/16/2019  Transportation Screening Complete  Medication Review Press photographer) Complete  HRI or [place] Complete  SW Recovery Care/Counseling Consult Complete  [place] Not Applicable  Some recent data might be hidden

## 2019-08-21 NOTE — Discharge Summary (Addendum)
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 Monitor blood pressures at the skilled nursing facility on a daily basis. 2. CBC and basic metabolic panel on a weekly basis 3. Palliative medicine consult at skilled nursing facility requested by her PCP due to recent multiple hospitalizations.  4. Patient has been placed on as needed metoclopramide tid with meals for nausea.    Brief/Interim Summary: Patient has remained medically stable, she continued to be very weak and deconditioned, had some nausea this morning that resolved with antiemetics.  Continue medical management, and further physical therapy at a skilled nursing facility.  Patient with persistent nausea, added as needed pre-meal metoclopramide with improvement of her symptoms.   Discharge summary per Dr. Mardella Layman.   " INITIAL HISTORY: 69 y.o.female69 year old African-American female with a past medical history of diabetes mellitus type 2, hyperlipidemia, asthma, recent hospitalization for COVID-19 earlier in January was rehospitalized on January 14 due to generalized weakness, falls, diarrhea. She had some abdominal pain and was found to have a periumbilical rash. She was placed on antibiotics. She was also noted to be orthostatic. She also had acute on chronic kidney disease. She was stabilized. She was seen by PT who recommended skilled nursing facility for rehab. Patient  declined this and wanted to go home. She was subsequently discharged home. Patient apparently lives by herself.  She presented back to the emergency department complaining of leg swelling and shortness of breath. She says that she has been unable to get around the house due to the swelling. She thinks that she will need to go to [place] facility for rehab. She denies any shortness of breath currently. No chest pain. No nausea or vomiting. She wants something to eat this morning. She denies any abdominal pain currently.  She was subsequently hospitalized for further management.   HOSPITAL COURSE:   Failure to thrive/generalized weakness/frequent falls This is mainly due to physical deconditioning from recent hospitalizations for COVID-19. Skilled nursing facility was recommended the last time she was in the hospital however she declined and went home with home health. Did not do well and came back to the ED. Was found to have renal failure. Now agreeable to go to skilled nursing facility for rehab.   Recent COVID-19 pneumonia Seems to have improved. She was treated with steroids. Not given remdesivir during previous hospitalization. Saturating normal on room air.  Recent nausea vomiting and diarrhea Appears to have resolved.  Hypotension Blood pressure was noted to be borderline low. Her antihypertensives including isosorbide mononitrate along with metoprolol were held. Blood pressure has improved.   Noted to be tachycardic this morning which could be rebound tachycardia from being off of beta-blocker.  We have reinitiated her metoprolol.  Diabetes mellitus type 2 in the setting of chronic kidney disease stage IIIb CBGs are reasonably well controlled. May resume Metformin at discharge.  Acute on chronic kidney disease stage IIIb Baseline creatinines around 1.4-1.5. Was noted to be elevated at admission due to dehydration. Improved with gentle IV  hydration. Continue to monitor urine output. IV fluids were  stopped.  Periumbilical rash Completed course of levofloxacin.  Chronic pain syndrome/peripheral neuropathy Continue gabapentin and tramadol.  History of coronary artery disease Continue Plavix and statin.  Recent acute lower extremity DVT Continue apixaban  Normocytic anemia Hemoglobin close to baseline. No evidence for overt bleeding. Continue to monitor.  Obesity Estimated body mass index is 30.99 kg/m as calculated from the following: Height as of this encounter: 5' 6"  (1.676 m). Weight as of this encounter: 87.1 kg. " "   Discharge Diagnoses:  Principal Problem:   FTT (failure to thrive) in adult Active Problems:   Diabetic polyneuropathy associated with type 2 diabetes mellitus (HCC)   Chronic pain syndrome   CKD (chronic kidney disease) stage 3, GFR 30-59 ml/min (HCC)   CAD (coronary artery disease)   AKI (acute kidney injury) [facility])    Discharge Instructions  Discharge Instructions    Call MD for:  difficulty breathing, headache or visual disturbances   Complete by: As directed    Call MD for:  extreme fatigue   Complete by: As directed    Call MD for:  persistant dizziness or light-headedness   Complete by: As directed    Call MD for:  persistant nausea and vomiting   Complete by: As directed    Call MD for:  severe uncontrolled pain   Complete by: As directed    Call MD for:  temperature >100.4   Complete by: As directed    Discharge instructions   Complete by: As directed    Please review instructions on the discharge summary as well.  You were cared for by a hospitalist during your hospital stay. If you have any questions about your discharge medications or the care you received while you were in the hospital after you are discharged, you can call the unit and asked to speak with the hospitalist on call if the hospitalist that took care of you is not available. Once you are  discharged, your primary care physician will handle any further medical issues. Please note that NO REFILLS for any discharge medications will be authorized once you are discharged, as it is imperative that you return to your primary care physician (or establish a relationship with a primary care physician if you do not have one) for your aftercare needs so that they can reassess your need for medications and monitor your lab values. If you do not have a primary care physician, you can call [phone] for a physician referral.   Increase activity slowly   Complete by: As directed      Allergies as of 08/21/2019      Reactions   Cephalexin Itching, Rash, Other (See Comments)   Sensation of throat closing   Adhesive [tape] Other (See Comments)   Burn Skin   Codeine Other (See Comments)   paranoid   Zocor [simvastatin - High Dose] Other (See Comments)   Muscle spasms   Penicillins Rash   Has patient had a PCN reaction causing immediate rash, facial/tongue/throat swelling, SOB or lightheadedness with hypotension: Yes Has patient had a PCN reaction causing severe rash involving mucus membranes or skin necrosis: No Has patient had a PCN reaction that required hospitalization does not remember.  Has patient had a PCN reaction occurring within the last 10 years: childhood If all of the above answers are "NO", then may proceed with Cephalosporin use.      Medication List    STOP taking these medications   B-D UF III MINI PEN NEEDLES 31G  X 5 MM Misc Generic drug: Insulin Pen Needle   Eliquis DVT/PE Starter Pack 5 MG Tbpk Generic drug: Apixaban Starter Pack Replaced by: apixaban 5 MG Tabs tablet   isosorbide mononitrate 30 MG 24 hr tablet Commonly known as: IMDUR   levofloxacin 750 MG tablet Commonly known as: LEVAQUIN   predniSONE 20 MG tablet Commonly known as: Deltasone     TAKE these medications   albuterol (2.5 MG/3ML) 0.083% nebulizer solution Commonly known as: PROVENTIL Take 3  mLs (2.5 mg total) by nebulization every 6 (six) hours as needed for wheezing or shortness of breath.   albuterol 108 (90 Base) MCG/ACT inhaler Commonly known as: VENTOLIN HFA Inhale 2 puffs into the lungs every 6 (six) hours as needed for wheezing or shortness of breath.   apixaban 5 MG Tabs tablet Commonly known as: ELIQUIS Take 1 tablet (5 mg total) by mouth 2 (two) times daily. Replaces: Eliquis DVT/PE Starter Pack 5 MG Tbpk   Centrum Silver Adult 50+ Tabs Take 1 tablet once a day What changed:   how much to take  how to take this  when to take this   clopidogrel 75 MG tablet Commonly known as: PLAVIX Take 1 tablet (75 mg total) by mouth daily.   darifenacin 7.5 MG 24 hr tablet Commonly known as: ENABLEX Take 7.5 mg by mouth daily.   feeding supplement (GLUCERNA SHAKE) Liqd Take 237 mLs by mouth 3 (three) times daily between meals.   gabapentin 400 MG capsule Commonly known as: NEURONTIN Take 1 capsule (400 mg total) by mouth 3 (three) times daily.   glucose blood test strip Commonly known as: OneTouch Verio Use as instructed to check blood sugar 3 times a day   ipratropium-albuterol 0.5-2.5 (3) MG/3ML Soln Commonly known as: DUONEB Take 3 mLs by nebulization every 6 (six) hours as needed. What changed: reasons to take this   lidocaine-prilocaine cream Commonly known as: EMLA Apply 1 application topically as needed. What changed: reasons to take this   metFORMIN 500 MG tablet Commonly known as: GLUCOPHAGE Take 1 tablet (500 mg total) by mouth 2 (two) times daily with a meal.   metoprolol tartrate 25 MG tablet Commonly known as: LOPRESSOR Take 0.5 tablets (12.5 mg total) by mouth 2 (two) times daily.   nitroGLYCERIN 0.4 MG SL tablet Commonly known as: Nitrostat Place 1 tablet (0.4 mg total) under the tongue every 5 (five) minutes as needed. What changed: reasons to take this   nystatin powder Commonly known as: MYCOSTATIN/NYSTOP Apply topically 3  (three) times daily.   ondansetron 4 MG tablet Commonly known as: ZOFRAN Take 1 tablet (4 mg total) by mouth every 6 (six) hours as needed for nausea.   pantoprazole 40 MG tablet Commonly known as: PROTONIX Take 1 tablet (40 mg total) by mouth daily.   rosuvastatin 20 MG tablet Commonly known as: CRESTOR Take 1 tablet (20 mg total) by mouth daily at 6 PM.   sertraline 50 MG tablet Commonly known as: ZOLOFT Take 1 tablet (50 mg total) by mouth daily.   sucralfate 1 g tablet Commonly known as: CARAFATE TAKE 1 TABLET BY MOUTH TWICE DAILY   traMADol 50 MG tablet Commonly known as: ULTRAM Take 2 tablets (100 mg total) by mouth 3 (three) times daily as needed for moderate pain.   traZODone 50 MG tablet Commonly known as: DESYREL Take 0.5 tablets (25 mg total) by mouth at bedtime as needed for sleep.      Follow-up Information  Carollee Herter, Kendrick Fries R, DO. Schedule an appointment as soon as possible for a visit in 2 week(s).   Specialty: Family Medicine Contact information: [address] [place] [address] [phone]          Allergies  Allergen Reactions  . Cephalexin Itching, Rash and Other (See Comments)    Sensation of throat closing  . Adhesive [Tape] Other (See Comments)    Burn Skin  . Codeine Other (See Comments)    paranoid  . Zocor [Simvastatin - High Dose] Other (See Comments)    Muscle spasms  . Penicillins Rash    Has patient had a PCN reaction causing immediate rash, facial/tongue/throat swelling, SOB or lightheadedness with hypotension: Yes Has patient had a PCN reaction causing severe rash involving mucus membranes or skin necrosis: No Has patient had a PCN reaction that required hospitalization does not remember.  Has patient had a PCN reaction occurring within the last 10 years: childhood If all of the above answers are "NO", then may proceed with Cephalosporin use.      Procedures/Studies: CT ABDOMEN PELVIS WO  CONTRAST  Result Date: 08/08/2019 CLINICAL DATA:  Nausea and vomiting. Hypotension. Weakness and dizziness. Diarrhea. COVID-19. EXAM: CT ABDOMEN AND PELVIS WITHOUT CONTRAST TECHNIQUE: Multidetector CT imaging of the abdomen and pelvis was performed following the standard protocol without IV contrast. COMPARISON:  CT scan dated 03/22/2019 FINDINGS: Lower chest: There are small patchy peripheral infiltrates at both lung bases consistent with COVID pneumonia. Heart size is normal. Coronary artery calcifications. Coronary stents. No pericardial effusion. Small hiatal hernia, chronic. Hepatobiliary: Severe progressive hepatic steatosis. Gallbladder is distended. No dilated bile ducts. Pancreas: Diffuse pancreatic atrophy, unchanged. Otherwise negative. Spleen: Normal in size without focal abnormality. Adrenals/Urinary Tract: Normal adrenal glands. Slight left renal atrophy, chronic. Compensatory hypertrophy of the right kidney. No hydronephrosis. The visualized portion of the bladder is normal. Bladder detail is obscured by metallic artifact from the right hip prosthesis. Stomach/Bowel: Small hiatal hernia. Surgical staples in the stomach, duodenum, and at the tip of the cecum. Prior appendectomy. There are few diverticula in the sigmoid portion of the colon. No acute bowel abnormality. Vascular/Lymphatic: Aortic atherosclerosis. No enlarged abdominal or pelvic lymph nodes. Reproductive: The area of the uterus and ovaries are obscured by metallic artifact. Other: No abdominal wall hernia or abnormality. No abdominopelvic ascites. Musculoskeletal: No acute abnormalities. IMPRESSION: 1. Small patchy peripheral infiltrates at both lung bases consistent with COVID pneumonia. 2. Severe progressive hepatic steatosis. 3. No acute abnormality of the abdomen and pelvis. 4. Aortic Atherosclerosis (ICD10-I70.0). Electronically Signed   By: Lorriane Shire M.D.   On: 08/08/2019 15:36   CT HEAD WO CONTRAST  Result Date:  08/10/2019 CLINICAL DATA:  Headache.  Suspicion of intracranial hemorrhage. EXAM: CT HEAD WITHOUT CONTRAST TECHNIQUE: Contiguous axial images were obtained from the base of the skull through the vertex without intravenous contrast. COMPARISON:  03/19/2018 FINDINGS: Brain: Mild age related atrophy. No evidence of old or acute infarction, mass lesion, hemorrhage, hydrocephalus or extra-axial collection. Vascular: There is atherosclerotic calcification of the major vessels at the base of the brain. Skull: Negative Sinuses/Orbits: Clear/normal Other: None IMPRESSION: Normal study for age.  No cause of headache identified. Electronically Signed   By: Nelson Chimes M.D.   On: 08/10/2019 21:00   US Abdomen Complete  Result Date: 08/10/2019 CLINICAL DATA:  69 year old female with abdominal pain. EXAM: ABDOMEN ULTRASOUND COMPLETE COMPARISON:  CT of the abdomen pelvis dated 08/08/2019 FINDINGS: Gallbladder: No  gallstones or wall thickening visualized. No sonographic Murphy sign noted by sonographer. Common bile duct: Diameter: 3 mm Liver: There is diffuse increased liver echogenicity most commonly seen in the setting of fatty infiltration. Superimposed inflammation or fibrosis is not excluded. Clinical correlation is recommended. Portal vein is patent on color Doppler imaging with normal direction of blood flow towards the liver. IVC: No abnormality visualized. Pancreas: Not well visualized. Spleen: Size and appearance within normal limits. Right Kidney: Length: 10 cm. Normal echogenicity. No hydronephrosis or shadowing stone. Left Kidney: Length: 9 cm. Normal echogenicity. No hydronephrosis or shadowing stone. Abdominal aorta: No aneurysm visualized. Other findings: None. IMPRESSION: Fatty liver, otherwise unremarkable abdominal ultrasound. Electronically Signed   By: Anner Crete M.D.   On: 08/10/2019 22:19   DG Chest Portable 1 View  Result Date: 08/16/2019 CLINICAL DATA:  Hypoxia. EXAM: PORTABLE CHEST 1 VIEW  COMPARISON:  August 08, 2019 FINDINGS: There is stable right-sided venous Port-A-Cath positioning. There is no evidence of acute infiltrate, pleural effusion or pneumothorax. The heart size and mediastinal contours are within normal limits. Degenerative changes seen within the thoracic spine. IMPRESSION: No active disease. Electronically Signed   By: Virgina Norfolk M.D.   On: 08/16/2019 19:06   DG Chest Port 1 View  Result Date: 08/08/2019 CLINICAL DATA:  Shortness of breath EXAM: PORTABLE CHEST 1 VIEW COMPARISON:  July 29, 2019 FINDINGS: Port-A-Cath tip is in the superior vena cava. No pneumothorax. There is a small right pleural effusion. There is no edema or consolidation. Heart is upper normal in size with pulmonary vascularity normal. No evident adenopathy. There is degenerative change in the left shoulder. IMPRESSION: Central catheter position unchanged without pneumothorax. Small right pleural effusion. No edema or consolidation. Stable cardiac silhouette. No demonstrable adenopathy. Electronically Signed   By: Lowella Grip III M.D.   On: 08/08/2019 14:38   DG CHEST PORT 1 VIEW  Result Date: 07/29/2019 CLINICAL DATA:  Hypoxia EXAM: PORTABLE CHEST 1 VIEW COMPARISON:  07/27/2019 FINDINGS: The heart size and mediastinal contours are within normal limits. Right chest port catheter. Both lungs are clear. The visualized skeletal structures are unremarkable. IMPRESSION: No acute abnormality of the lungs in AP portable projection. Electronically Signed   By: Eddie Candle M.D.   On: 07/29/2019 08:10   DG CHEST PORT 1 VIEW  Result Date: 07/27/2019 CLINICAL DATA:  hypoxia EXAM: PORTABLE CHEST 1 VIEW COMPARISON:  Chest radiograph 08/24/2018 FINDINGS: Enlargement of the cardiomediastinal contours likely secondary to AP technique. Right central venous catheter is unchanged. The lungs are clear. No pneumothorax or large pleural effusion. No acute finding in the visualized skeleton. IMPRESSION: No  evidence of active disease. Electronically Signed   By: Audie Pinto M.D.   On: 07/27/2019 10:45   DG Chest Port 1 View  Result Date: 07/25/2019 CLINICAL DATA:  Fever, chills EXAM: PORTABLE CHEST 1 VIEW COMPARISON:  05/14/2019 FINDINGS: Right Port-A-Cath in place, unchanged. Heart and mediastinal contours are within normal limits. No focal opacities or effusions. No acute bony abnormality. IMPRESSION: No active disease. Electronically Signed   By: Rolm Baptise M.D.   On: 07/25/2019 11:44   DG Abd 2 Views  Result Date: 08/10/2019 CLINICAL DATA:  COVID-19 positive, acute onset lower abdominal pain. EXAM: ABDOMEN - 2 VIEW COMPARISON:  CT abdomen pelvis 08/08/2019 FINDINGS: Few contiguous loops of borderline air distended small bowel in the mid abdomen. Large volume of stool throughout the colon. Air and stool overlying the rectal vault. No suspicious calcifications. Prior right  hip arthroplasty is noted. Degenerative changes noted in the lumbar spine, pelvis and left hip. IMPRESSION: Few contiguous loops of increasingly air distended small bowel in the mid abdomen could reflect ileus or early obstruction. Electronically Signed   By: Lovena Le M.D.   On: 08/10/2019 17:55   DG FEMUR 1V LEFT  Result Date: 08/10/2019 CLINICAL DATA:  Pain status post fall EXAM: LEFT FEMUR 1 VIEW COMPARISON:  None. 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 There is no acute displaced fracture. No dislocation. Evaluation is limited by single view technique. Vascular calcifications are noted. IMPRESSION: No definite acute displaced fracture. Electronically Signed   By: Constance Holster M.D.   On: 08/10/2019 19:36   DG FEMUR 1V RIGHT  Result Date: 08/10/2019 CLINICAL DATA:  Pain status post fall. EXAM: RIGHT FEMUR 1 VIEW COMPARISON:  None. FINDINGS: The patient is status post total hip arthroplasty on the right. Vascular calcifications are noted. There is no displaced fracture. No  dislocation. There are few subtle lucencies coursing through the greater trochanter. There are multicompartmental degenerative changes of the right knee. Osteopenia is noted. IMPRESSION: 1. Subtle lucencies are noted coursing through the greater trochanter. If there is clinical concern for a fracture at this level, consider dedicated right hip radiographs for further evaluation. 2. Status post total hip arthroplasty. 3. Multicompartmental degenerative changes are noted of the knee. Electronically Signed   By: Constance Holster M.D.   On: 08/10/2019 19:40   VAS [place] LOWER EXTREMITY VENOUS (DVT)  Result Date: 07/28/2019  Lower Venous Study Indications: Elevated Ddimer.  Risk Factors: COVID 19 positive. Limitations: Body habitus and poor ultrasound/tissue interface. Comparison Study: No prior studies. Performing Technologist: Oliver Hum RVT  Examination Guidelines: A complete evaluation includes B-mode imaging, spectral Doppler, color Doppler, and power Doppler as needed of all accessible portions of each vessel. Bilateral testing is considered an integral part of a complete examination. Limited examinations for reoccurring indications may be performed as noted.  +---------+---------------+---------+-----------+----------+--------------+ RIGHT    CompressibilityPhasicitySpontaneityPropertiesThrombus Aging +---------+---------------+---------+-----------+----------+--------------+ CFV      Full           Yes      Yes                                 +---------+---------------+---------+-----------+----------+--------------+ SFJ      Full                                                        +---------+---------------+---------+-----------+----------+--------------+ FV Prox  Full                                                        +---------+---------------+---------+-----------+----------+--------------+ FV Mid   Full                                                         +---------+---------------+---------+-----------+----------+--------------+ FV DistalFull                                                        +---------+---------------+---------+-----------+----------+--------------+  PFV      Full                                                        +---------+---------------+---------+-----------+----------+--------------+ POP      Full           Yes      Yes                                 +---------+---------------+---------+-----------+----------+--------------+ PTV      Full                                                        +---------+---------------+---------+-----------+----------+--------------+ PERO     Partial                                      Acute          +---------+---------------+---------+-----------+----------+--------------+   +---------+---------------+---------+-----------+----------+--------------+ LEFT     CompressibilityPhasicitySpontaneityPropertiesThrombus Aging +---------+---------------+---------+-----------+----------+--------------+ CFV      Full           Yes      Yes                                 +---------+---------------+---------+-----------+----------+--------------+ SFJ      Full                                                        +---------+---------------+---------+-----------+----------+--------------+ FV Prox  Full                                                        +---------+---------------+---------+-----------+----------+--------------+ FV Mid   Full                                                        +---------+---------------+---------+-----------+----------+--------------+ FV DistalFull                                                        +---------+---------------+---------+-----------+----------+--------------+ PFV      Full                                                         +---------+---------------+---------+-----------+----------+--------------+  POP      Full           Yes      Yes                                 +---------+---------------+---------+-----------+----------+--------------+ PTV      Full                                                        +---------+---------------+---------+-----------+----------+--------------+ PERO     Full                                                        +---------+---------------+---------+-----------+----------+--------------+     Summary: Right: Findings consistent with acute deep vein thrombosis involving the right peroneal veins. No cystic structure found in the popliteal fossa. Left: There is no evidence of deep vein thrombosis in the lower extremity. No cystic structure found in the popliteal fossa.  *See table(s) above for measurements and observations. Electronically signed by Deitra Mayo MD on 07/28/2019 at 5:05:04 PM.    Final       Subjective: Patient is feeling better, continue to be very weak and deconditioned, her nausea has improved with antiemetics, no chest pain.   Discharge Exam: Vitals:   08/21/19 1218 08/21/19 1555  BP:  98/62  Pulse:  94  Resp:  19  Temp:  97.8 F (36.6 C)  SpO2: 99% 94%   Vitals:   08/21/19 0748 08/21/19 0907 08/21/19 1218 08/21/19 1555  BP: 95/69 109/71  98/62  Pulse: 82 84  94  Resp: 18   19  Temp: 97.9 F (36.6 C)   97.8 F (36.6 C)  TempSrc: Oral   Tympanic  SpO2: 100%  99% 94%  Weight:      Height:        General: Not in pain or dyspnea.  Neurology: Awake and alert, non focal  E ENT: no pallor, no icterus, oral mucosa moist Cardiovascular: No JVD. S1-S2 present, rhythmic, no gallops, rubs, or murmurs. No lower extremity edema. Pulmonary: positive breath sounds bilaterally.  Gastrointestinal. Abdomen with no organomegaly, non tender, no rebound or guarding Skin. No rashes Musculoskeletal: no joint deformities   The results of  significant diagnostics from this hospitalization (including imaging, microbiology, ancillary and laboratory) are listed below for reference.     Microbiology: Recent Results (from the past 240 hour(s))  SARS Coronavirus 2 by RT PCR (hospital order, performed in [facility] hospital lab) Nasopharyngeal Nasopharyngeal Swab     Status: Abnormal   Collection Time: 08/16/19 10:39 PM   Specimen: Nasopharyngeal Swab  Result Value Ref Range Status   SARS Coronavirus 2 POSITIVE (A) NEGATIVE Final    Comment: RESULT CALLED TO, READ BACK BY AND VERIFIED WITH: ADKINS,L,RN @ 9476 08/17/19 BY GWYN,P (NOTE) SARS-CoV-2 target nucleic acids are DETECTED SARS-CoV-2 RNA is generally detectable in upper respiratory specimens  during the acute phase of infection.  Positive results are indicative  of the presence of the identified virus, but do not rule out bacterial infection or co-infection with other pathogens not detected by  the test.  Clinical correlation with patient history and  other diagnostic information is necessary to determine patient infection status.  The expected result is negative. Fact Sheet for Patients:   StrictlyIdeas.no  Fact Sheet for Healthcare Providers:   BankingDealers.co.za   This test is not yet approved or cleared by the [place] FDA and  has been authorized for detection and/or diagnosis of SARS-CoV-2 by FDA under an Emergency Use Authorization (EUA).  This EUA will remain in effect (meaning this test can  be used) for the duration of  the COVID-19 declaration under Section 564(b)(1) of the Act, 21 U.S.C. section 360-bbb-3(b)(1), unless the authorization is terminated or revoked sooner. Performed at [facility], [address] [place], [place] [address]      Labs: BNP (last 3 results) Recent Labs    08/16/19 2022  BNP 41.7   Basic Metabolic Panel: Recent Labs  Lab 08/16/19 2220 08/18/19 0638  08/19/19 0836  NA 143 142 142  K 4.1 4.8 4.3  CL 112* 110 108  CO2 26 26 28   GLUCOSE 160* 172* 147*  BUN 19 16 15   CREATININE 1.89* 1.31* 1.32*  CALCIUM 8.3* 7.6* 8.2*   Liver Function Tests: Recent Labs  Lab 08/16/19 2220  AST 30  ALT 55*  ALKPHOS 211*  BILITOT 0.9  PROT 5.4*  ALBUMIN 2.1*   No results for input(s): LIPASE, AMYLASE in the last 168 hours. No results for input(s): AMMONIA in the last 168 hours. CBC: Recent Labs  Lab 08/16/19 2022 08/18/19 0638 08/19/19 0836  WBC 10.4 8.3 10.3  NEUTROABS 5.8  --   --   HGB 9.3* 8.2* 9.0*  HCT 26.9* 23.3* 26.0*  MCV 83.0 82.3 82.8  PLT 160 169 243   Cardiac Enzymes: No results for input(s): CKTOTAL, CKMB, CKMBINDEX, TROPONINI in the last 168 hours. BNP: Invalid input(s): POCBNP CBG: Recent Labs  Lab 08/20/19 1225 08/20/19 1613 08/20/19 2038 08/21/19 0747 08/21/19 1216  GLUCAP 102* 172* 197* 95 112*   D-Dimer No results for input(s): DDIMER in the last 72 hours. Hgb A1c No results for input(s): HGBA1C in the last 72 hours. Lipid Profile No results for input(s): CHOL, HDL, LDLCALC, TRIG, CHOLHDL, LDLDIRECT in the last 72 hours. Thyroid function studies No results for input(s): TSH, T4TOTAL, T3FREE, THYROIDAB in the last 72 hours.  Invalid input(s): FREET3 Anemia work up No results for input(s): VITAMINB12, FOLATE, FERRITIN, TIBC, IRON, RETICCTPCT in the last 72 hours. Urinalysis    Component Value Date/Time   COLORURINE YELLOW 08/17/2019 0155   APPEARANCEUR CLEAR 08/17/2019 0155   LABSPEC 1.015 08/17/2019 0155   LABSPEC 1.015 04/06/2015 0939   PHURINE 6.0 08/17/2019 0155   GLUCOSEU NEGATIVE 08/17/2019 0155   HGBUR NEGATIVE 08/17/2019 0155   BILIRUBINUR NEGATIVE 08/17/2019 0155   BILIRUBINUR 1+ 10/01/2018 1146   KETONESUR NEGATIVE 08/17/2019 0155   PROTEINUR NEGATIVE 08/17/2019 0155   UROBILINOGEN 0.2 10/01/2018 1146   UROBILINOGEN 0.2 04/06/2015 0939   NITRITE NEGATIVE 08/17/2019 0155    LEUKOCYTESUR TRACE (A) 08/17/2019 0155   Sepsis Labs Invalid input(s): PROCALCITONIN,  WBC,  LACTICIDVEN Microbiology Recent Results (from the past 240 hour(s))  SARS Coronavirus 2 by RT PCR (hospital order, performed in [place] hospital lab) Nasopharyngeal Nasopharyngeal Swab     Status: Abnormal   Collection Time: 08/16/19 10:39 PM   Specimen: Nasopharyngeal Swab  Result Value Ref Range Status   SARS Coronavirus 2 POSITIVE (A) NEGATIVE Final    Comment: RESULT  CALLED TO, READ BACK BY AND VERIFIED WITH: ADKINS,L,RN @ 1735 08/17/19 BY GWYN,P (NOTE) SARS-CoV-2 target nucleic acids are DETECTED SARS-CoV-2 RNA is generally detectable in upper respiratory specimens  during the acute phase of infection.  Positive results are indicative  of the presence of the identified virus, but do not rule out bacterial infection or co-infection with other pathogens not detected by the test.  Clinical correlation with patient history and  other diagnostic information is necessary to determine patient infection status.  The expected result is negative. Fact Sheet for Patients:   StrictlyIdeas.no  Fact Sheet for Healthcare Providers:   BankingDealers.co.za   This test is not yet approved or cleared by the [place] FDA and  has been authorized for detection and/or diagnosis of SARS-CoV-2 by FDA under an Emergency Use Authorization (EUA).  This EUA will remain in effect (meaning this test can  be used) for the duration of  the COVID-19 declaration under Section 564(b)(1) of the Act, 21 U.S.C. section 360-bbb-3(b)(1), unless the authorization is terminated or revoked sooner. Performed at [facility], [address], [place], [place] [address]      Time coordinating discharge: 45 minutes  SIGNED:   Tawni Millers, MD  [facility] 08/21/2019, 3:59 PM

## 2019-08-21 NOTE — TOC Transition Note (Signed)
Transition of Care [facility]) - CM/SW Discharge Note   Patient Details  Name: [name] MRN: [id number] Date of Birth: 01-04-1951  Transition of Care [facility]) CM/SW Contact:  Shade Flood, LCSW Phone Number: 08/21/2019, 4:03 PM   Clinical Narrative:     Pt stable for dc per MD. Damaris Schooner with pt and her daughter, [name], by phone to update on bed offers. Pt has reluctantly accepted transfer to [facility]. Daughter has concerns and would like to speak with MD. Message relayed and MD stated he would call her.  EMS forms done. Unit NT assisting with dc packet. DC clinical sent electronically. RN will call report.  There are no other TOC needs for dc.  Final next level of care: Skilled Nursing Facility Barriers to Discharge: Barriers Resolved   Patient Goals and CMS Choice Patient states their goals for this hospitalization and ongoing recovery are:: to go to SNF then return home. CMS Medicare.gov Compare Post Acute Care list provided to:: Patient Represenative (must comment) Choice offered to / list presented to : Adult Children  Discharge Placement PASRR number recieved: 08/21/19            Patient chooses bed at: [facility] Patient to be transferred to facility by: [place] Name of family member notified: Alanna Patient and family notified of of transfer: 08/21/19  Discharge Plan and Services                                     Social Determinants of Health (SDOH) Interventions     Readmission Risk Interventions Readmission Risk Prevention Plan 05/16/2019  Transportation Screening Complete  Medication Review ([place]) Complete  HRI or [place] Complete  SW Recovery Care/Counseling Consult Complete  [place] Not Applicable  Some recent data might be hidden

## 2019-08-22 LAB — GLUCOSE, CAPILLARY
Glucose-Capillary: 118 mg/dL — ABNORMAL HIGH (ref 70–99)
Glucose-Capillary: 160 mg/dL — ABNORMAL HIGH (ref 70–99)
Glucose-Capillary: 127 mg/dL — ABNORMAL HIGH (ref 70–99)
Glucose-Capillary: 192 mg/dL — ABNORMAL HIGH (ref 70–99)

## 2019-08-22 MED ORDER — METOCLOPRAMIDE HCL 5 MG PO TABS
5.0000 mg | ORAL_TABLET | Freq: Three times a day (TID) | ORAL | Status: DC
  Administered 2019-08-22 – 2019-08-23 (×6): 5 mg via ORAL
  Filled 2019-08-22 (×10): qty 1

## 2019-08-22 MED ORDER — BISMUTH SUBSALICYLATE 262 MG/15ML PO SUSP
30.0000 mL | Freq: Three times a day (TID) | ORAL | Status: DC
  Filled 2019-08-22: qty 118

## 2019-08-22 NOTE — Progress Notes (Signed)
Physical Therapy Treatment Patient Details Name: [name] MRN: [id number] DOB: Apr 15, 1951 Today's Date: 08/22/2019    History of Present Illness 69 y.o. female with past medical history significant for DM2, HLD, Asthma who presented to the ED for progressive cough, dyspnea over the past week (1/02). admitted to [facility] w/ COVID (dx 12/31) and d/c home (at her and family insistence)1/4. Readmitted to hosital w/ falls and COVID PNA on 1/14, d/c home once more 1/20. And returned to ED 1/22 w/ c/o BLE swelling and SOB.    PT Comments    She complained of nausea today, was medicated by RN for same, and she was agreeable to particpate in PT. She reviewed all ex for UE/LE with theraband with good return demo. Practiced IS . Ambulated in room with RW, and Close Supervision/Mid guard- with gait belt. No adverse effects this session. She is slowly improving in endurance, strength and balance in gait with RW. Should continue to benefit from PT to further address goals as outlined for optimal functional outcomes.  Follow Up Recommendations  SNF;Supervision/Assistance - 24 hour;CIR     Equipment Recommendations  Other (comment)(Rollator)    Recommendations for Other Services       Precautions / Restrictions Precautions Precautions: Fall Restrictions Weight Bearing Restrictions: No Other Position/Activity Restrictions: Swelling significantly decreased in both LEs- has compression hose and these were applied during PT    Mobility  Bed Mobility Overal bed mobility: Modified Independent   Rolling: Modified independent (Device/Increase time)   Supine to sit: Modified independent (Device/Increase time) Sit to supine: Supervision   General bed mobility comments: HOB elevated and use of bedrail  Transfers Overall transfer level: Needs assistance Equipment used: Rolling walker (2 wheeled) Transfers: Sit to/from [facility] Sit to Stand: Supervision;Min guard Stand pivot  transfers: Min guard;Supervision       General transfer comment: Improving when arising from low surface  Ambulation/Gait Ambulation/Gait assistance: Supervision;Min guard Gait Distance (Feet): 32 Feet Assistive device: Rolling walker (2 wheeled) Gait Pattern/deviations: Step-through pattern;Decreased stride length;Trunk flexed   Gait velocity interpretation: 1.31 - 2.62 ft/sec, indicative of limited community ambulator General Gait Details: She was cooperative and receptive to PT today. Anticipates discharge in the next 24-48 hours but states she has not heard anything definite about where she is going   Marine scientist [place] (Stroke Patients Only)       Balance Overall balance assessment: Needs assistance Sitting-balance support: Feet supported Sitting balance-Leahy Scale: Good     Standing balance support: During functional activity;Bilateral upper extremity supported Standing balance-Leahy Scale: Fair(F+,G-) Standing balance comment: F+, G-                            Cognition Arousal/Alertness: Awake/alert                                     General Comments: Pt requires encouragement to engage in OOB activities  - Did complain of nausea, but had been medicated by RN for this- she agreed to participate      Exercises General Exercises - Lower Extremity Ankle Circles/Pumps: AROM;Seated Quad Sets: AROM;Seated;Both;10 reps Gluteal Sets: AROM;Seated;Both;10 reps Hip ABduction/ADduction: AROM;Seated;Both;10 reps Straight Leg Raises: AROM;Seated Other Exercises Other Exercises: She as able to achieve 1500, 8 out of  10 reps, and 1750 for 2 reps    General Comments General comments (skin integrity, edema, etc.): On room air. No reports of SOB throughout session. Need to monitor skin integrity-complains of dry skin. Lotion provided.      Pertinent Vitals/Pain Pain Assessment: No/denies pain     Home Living                      Prior Function            PT Goals (current goals can now be found in the care plan section) Acute Rehab PT Goals Patient Stated Goal: Agreeable to sitting up in the chair., ambulation and exercises PT Goal Formulation: With patient Time For Goal Achievement: 08/31/19 Potential to Achieve Goals: Good Progress towards PT goals: Progressing toward goals    Frequency    Min 2X/week      PT Plan Current plan remains appropriate    Co-evaluation              AM-PAC PT "6 Clicks" Mobility   Outcome Measure  Help needed turning from your back to your side while in a flat bed without using bedrails?: A Little Help needed moving from lying on your back to sitting on the side of a flat bed without using bedrails?: None Help needed moving to and from a bed to a chair (including a wheelchair)?: A Little Help needed standing up from a chair using your arms (e.g., wheelchair or bedside chair)?: A Little Help needed to walk in hospital room?: A Little Help needed climbing 3-5 steps with a railing? : A Lot 6 Click Score: 18    End of Session Equipment Utilized During Treatment: Gait belt Activity Tolerance: Patient tolerated treatment well Patient left: in chair;with call bell/phone within reach Nurse Communication: Mobility status PT Visit Diagnosis: Unsteadiness on feet (R26.81);Other abnormalities of gait and mobility (R26.89);Muscle weakness (generalized) (M62.81)     Time: 7591-6384 PT Time Calculation (min) (ACUTE ONLY): 45 min  Charges:  $Gait Training: 8-22 mins $Therapeutic Exercise: 23-37 mins          Rollen Sox, PT # [phone] CGV cell             Casandra Doffing 08/22/2019, 10:51 AM

## 2019-08-22 NOTE — Progress Notes (Signed)
PROGRESS NOTE    [name]  LYY:[id number] DOB: July 17, 1951 DOA: 08/16/2019 PCP: Ann Held, DO    Brief Narrative:  69 year old female who was initially hospitalized December 31 through January 5 with a working diagnosis of COVID-19 viral infection, she is suffering mainly from acute kidney injury and generalized weakness along with acute right lower extremity deep vein thrombosis.  Patient was ruled out for viral pneumonia.  At home patient did poorly, very weak and deconditioned, nausea, she required rehospitalization January 14 to 21, for abdominal pain and abdominal wall cellulitis, complicated with aki an orthostatic hypotension.   Patient declined SNF and discharged home.  Patient continue to have generalized weakness, with lower extremity edema and difficulty walking. Rehospitalization for further management.    Assessment & Plan:   Principal Problem:   FTT (failure to thrive) in adult Active Problems:   Diabetic polyneuropathy associated with type 2 diabetes mellitus (HCC)   Chronic pain syndrome   CKD (chronic kidney disease) stage 3, GFR 30-59 ml/min (HCC)   CAD (coronary artery disease)   AKI (acute kidney injury) ([place])   1. Persistent nausea due to dyspepsia. Her symptoms have improved but not yet back to baseline, will continue with as needed zofran and will add tid metoclopramide with meal plus bismth subsalicylate.  Continue close monitoring of po intake. Continue antiacid therapy with pantoprazole.  2. COVID 19 viral pneumonia.   3. Hypotension. Continue blood pressure control with metoprolol 25 mg bid.   4.  Uncontrolled T2DM (Hgb A1c 8,7). Will continue glucose cover and monitoring with insulin sliding scale, and meal coverage.    5. Ckd stage 3b. Her renal function has remain stable at 1,32 with K 4,3 and serum bicarbonate at 28. Will continue to avoid nephrotoxic medications or hypotension.   6. Chronica pain syndrome. Continue pain control   7.  Right lower extremity DVT. Continue anticoagulation with apixaban.   8. Chronica anemia. Hgn and Hct have been stable, will continue outpatient monitoring.   9. Obesity.   10. Depression. Continue with sertraline.    DVT prophylaxis: apixaban   Code Status:  full Family Communication: I spoke over the phone with the patient's daughter about patient's  condition, plan of care, prognosis and all questions were addressed. Disposition Plan/ discharge barriers: pending SNF when nausea resolves.     Subjective: Patient continue to have nausea but no vomiting, no chest pain, no dyspnea, continue to be very weak and deconditioned.   Objective: Vitals:   08/21/19 2026 08/22/19 0452 08/22/19 0758 08/22/19 1039  BP: (!) 107/91 (!) 90/57 97/70   Pulse: 89 82 80   Resp: 18 16 16    Temp: 99 F (37.2 C) 98.7 F (37.1 C) 98.5 F (36.9 C)   TempSrc: Oral Oral Oral   SpO2: 95% 100% 100% 100%  Weight:      Height:        Intake/Output Summary (Last 24 hours) at 08/22/2019 1203 Last data filed at 08/22/2019 0457 Gross per 24 hour  Intake 120 ml  Output 1150 ml  Net -1030 ml   Filed Weights   08/16/19 1810  Weight: 87.1 kg    Examination:   General: Not in pain or dyspnea. Deconditioned  Neurology: Awake and alert, non focal  E ENT: mild pallor, no icterus, oral mucosa moist Cardiovascular: No JVD. S1-S2 present, rhythmic, no gallops, rubs, or murmurs. No lower extremity edema. Pulmonary: positive breath sounds bilaterally. Gastrointestinal. Abdomen with no organomegaly, non  tender, no rebound or guarding Skin. No rashes Musculoskeletal: no joint deformities     Data Reviewed: I have personally reviewed following labs and imaging studies  CBC: Recent Labs  Lab 08/16/19 2022 08/18/19 0638 08/19/19 0836  WBC 10.4 8.3 10.3  NEUTROABS 5.8  --   --   HGB 9.3* 8.2* 9.0*  HCT 26.9* 23.3* 26.0*  MCV 83.0 82.3 82.8  PLT 160 169 024   Basic Metabolic Panel: Recent Labs    Lab 08/16/19 2220 08/18/19 0638 08/19/19 0836  NA 143 142 142  K 4.1 4.8 4.3  CL 112* 110 108  CO2 26 26 28   GLUCOSE 160* 172* 147*  BUN 19 16 15   CREATININE 1.89* 1.31* 1.32*  CALCIUM 8.3* 7.6* 8.2*   GFR: Estimated Creatinine Clearance: 46 mL/min (A) (by C-G formula based on SCr of 1.32 mg/dL (H)). Liver Function Tests: Recent Labs  Lab 08/16/19 2220  AST 30  ALT 55*  ALKPHOS 211*  BILITOT 0.9  PROT 5.4*  ALBUMIN 2.1*   No results for input(s): LIPASE, AMYLASE in the last 168 hours. No results for input(s): AMMONIA in the last 168 hours. Coagulation Profile: No results for input(s): INR, PROTIME in the last 168 hours. Cardiac Enzymes: No results for input(s): CKTOTAL, CKMB, CKMBINDEX, TROPONINI in the last 168 hours. BNP (last 3 results) No results for input(s): PROBNP in the last 8760 hours. HbA1C: No results for input(s): HGBA1C in the last 72 hours. CBG: Recent Labs  Lab 08/21/19 1216 08/21/19 1558 08/21/19 2029 08/22/19 0800 08/22/19 1118  GLUCAP 112* 136* 151* 118* 160*   Lipid Profile: No results for input(s): CHOL, HDL, LDLCALC, TRIG, CHOLHDL, LDLDIRECT in the last 72 hours. Thyroid Function Tests: No results for input(s): TSH, T4TOTAL, FREET4, T3FREE, THYROIDAB in the last 72 hours. Anemia Panel: No results for input(s): VITAMINB12, FOLATE, FERRITIN, TIBC, IRON, RETICCTPCT in the last 72 hours.    Radiology Studies: I have reviewed all of the imaging during this hospital visit personally     Scheduled Meds: . apixaban  5 mg Oral BID  . bismuth subsalicylate  30 mL Oral TID AC & HS  . Chlorhexidine Gluconate Cloth  6 each Topical Daily  . clopidogrel  75 mg Oral Daily  . darifenacin  7.5 mg Oral Daily  . docusate sodium  100 mg Oral BID  . gabapentin  400 mg Oral TID  . insulin aspart  0-20 Units Subcutaneous TID WC  . insulin aspart  0-5 Units Subcutaneous QHS  . metoCLOPramide  5 mg Oral TID AC & HS  . metoprolol tartrate  25 mg  Oral BID  . multivitamin with minerals  1 tablet Oral Daily  . nystatin   Topical TID  . pantoprazole  40 mg Oral Daily  . rosuvastatin  20 mg Oral q1800  . sertraline  50 mg Oral Daily  . sodium chloride flush  10-40 mL Intracatheter Q12H  . sucralfate  1 g Oral BID   Continuous Infusions:   LOS: 5 days        Jordie Skalsky Gerome Apley, MD

## 2019-08-22 NOTE — TOC Progression Note (Signed)
Transition of Care [facility]) - Progression Note    Patient Details  Name: [name] MRN: [id number] Date of Birth: May 22, 1951  Transition of Care [facility]) CM/SW Contact  Shade Flood, LCSW Phone Number: 08/22/2019, 11:35 AM  Clinical Narrative:     TOC following. Pt did not end up discharging yesterday due to nausea and vomiting. MD informed this TOC that pt continues with nausea today. MD states he plans to adjust patient's medication today and he will re-assess for dc tomorrow.   [facility] has already obtained authorization. Spoke with pt's daughter to update on bed offer status. Only outstanding referral is with [facility] Daughter will consider this option if they do end up offering as family prefers to keep patient in [place]. Message sent to Freda Munro at [facility] to inquire on status.   Covering TOC will follow.  Expected Discharge Plan: [place] Barriers to Discharge: Barriers Resolved  Expected Discharge Plan and Services Expected Discharge Plan: [place]         Expected Discharge Date: 08/21/19                                     Social Determinants of Health (SDOH) Interventions    Readmission Risk Interventions Readmission Risk Prevention Plan 05/16/2019  Transportation Screening Complete  Medication Review Press photographer) Complete  HRI or [place] Complete  SW Recovery Care/Counseling Consult Complete  Palliative Care Screening Not [place] Not Applicable  Some recent data might be hidden

## 2019-08-22 NOTE — Progress Notes (Signed)
Patient given Reglan but refused Pepto. States "her GI MD said she should not take Pepto ever". Nausea has decreased since first Reglan dose. Attending notified. Will continue to monitor.

## 2019-08-22 NOTE — Plan of Care (Signed)
  Problem: Respiratory: Goal: Will maintain a patent airway Outcome: Progressing   Problem: Respiratory: Goal: Complications related to the disease process, condition or treatment will be avoided or minimized Outcome: Progressing

## 2019-08-22 NOTE — Plan of Care (Signed)
  Problem: Education: Goal: Knowledge of risk factors and measures for prevention of condition will improve Outcome: Progressing   Problem: Coping: Goal: Psychosocial and spiritual needs will be supported Outcome: Progressing   Problem: Respiratory: Goal: Will maintain a patent airway Outcome: Progressing   

## 2019-08-23 ENCOUNTER — Other Ambulatory Visit: Payer: Self-pay | Admitting: *Deleted

## 2019-08-23 DIAGNOSIS — U071 COVID-19: Secondary | ICD-10-CM | POA: Diagnosis not present

## 2019-08-23 DIAGNOSIS — Z8616 Personal history of COVID-19: Secondary | ICD-10-CM | POA: Diagnosis not present

## 2019-08-23 DIAGNOSIS — N1832 Chronic kidney disease, stage 3b: Secondary | ICD-10-CM | POA: Diagnosis not present

## 2019-08-23 DIAGNOSIS — I959 Hypotension, unspecified: Secondary | ICD-10-CM | POA: Diagnosis not present

## 2019-08-23 DIAGNOSIS — G8929 Other chronic pain: Secondary | ICD-10-CM | POA: Diagnosis not present

## 2019-08-23 DIAGNOSIS — N183 Chronic kidney disease, stage 3 unspecified: Secondary | ICD-10-CM | POA: Diagnosis not present

## 2019-08-23 DIAGNOSIS — Z1383 Encounter for screening for respiratory disorder NEC: Secondary | ICD-10-CM | POA: Diagnosis not present

## 2019-08-23 DIAGNOSIS — J45909 Unspecified asthma, uncomplicated: Secondary | ICD-10-CM | POA: Diagnosis not present

## 2019-08-23 DIAGNOSIS — E1122 Type 2 diabetes mellitus with diabetic chronic kidney disease: Secondary | ICD-10-CM | POA: Diagnosis not present

## 2019-08-23 DIAGNOSIS — E785 Hyperlipidemia, unspecified: Secondary | ICD-10-CM | POA: Diagnosis not present

## 2019-08-23 DIAGNOSIS — G629 Polyneuropathy, unspecified: Secondary | ICD-10-CM | POA: Diagnosis not present

## 2019-08-23 DIAGNOSIS — M2550 Pain in unspecified joint: Secondary | ICD-10-CM | POA: Diagnosis not present

## 2019-08-23 DIAGNOSIS — Z7401 Bed confinement status: Secondary | ICD-10-CM | POA: Diagnosis not present

## 2019-08-23 DIAGNOSIS — I2510 Atherosclerotic heart disease of native coronary artery without angina pectoris: Secondary | ICD-10-CM | POA: Diagnosis not present

## 2019-08-23 DIAGNOSIS — D649 Anemia, unspecified: Secondary | ICD-10-CM | POA: Diagnosis not present

## 2019-08-23 DIAGNOSIS — Z20828 Contact with and (suspected) exposure to other viral communicable diseases: Secondary | ICD-10-CM | POA: Diagnosis not present

## 2019-08-23 DIAGNOSIS — R531 Weakness: Secondary | ICD-10-CM | POA: Diagnosis not present

## 2019-08-23 LAB — GLUCOSE, CAPILLARY
Glucose-Capillary: 100 mg/dL — ABNORMAL HIGH (ref 70–99)
Glucose-Capillary: 176 mg/dL — ABNORMAL HIGH (ref 70–99)

## 2019-08-23 MED ORDER — HEPARIN SOD (PORK) LOCK FLUSH 100 UNIT/ML IV SOLN
500.0000 [IU] | INTRAVENOUS | Status: DC
  Administered 2019-08-23: 17:00:00 500 [IU]
  Filled 2019-08-23: qty 5

## 2019-08-23 MED ORDER — HEPARIN SOD (PORK) LOCK FLUSH 100 UNIT/ML IV SOLN
500.0000 [IU] | INTRAVENOUS | Status: DC | PRN
  Filled 2019-08-23: qty 5

## 2019-08-23 MED ORDER — METOCLOPRAMIDE HCL 5 MG PO TABS: 5.0000 mg | ORAL_TABLET | Freq: Three times a day (TID) | ORAL | 0 refills | Status: AC | PRN

## 2019-08-23 NOTE — Plan of Care (Signed)
  Problem: Education: Goal: Knowledge of risk factors and measures for prevention of condition will improve Outcome: Progressing   Problem: Coping: Goal: Psychosocial and spiritual needs will be supported Outcome: Progressing   Problem: Respiratory: Goal: Will maintain a patent airway Outcome: Progressing Goal: Complications related to the disease process, condition or treatment will be avoided or minimized Outcome: Progressing   

## 2019-08-23 NOTE — Progress Notes (Signed)
x2 attempt to call report to [place] at [facility]. No answer, unable to leave voicemail w/callback numberWill continue to attempt to reach staff.

## 2019-08-23 NOTE — Progress Notes (Signed)
Physical Therapy Treatment Patient Details Name: [name] MRN: [id number] DOB: Jun 15, 1951 Today's Date: 08/23/2019    History of Present Illness 69 y.o. female with past medical history significant for DM2, HLD, Asthma who presented to the ED for progressive cough, dyspnea over the past week (1/02). admitted to [facility] w/ COVID (dx 12/31) and d/c home (at her and family insistence)1/4. Readmitted to hosital w/ falls and COVID PNA on 1/14, d/c home once more 1/20. And returned to ED 1/22 w/ c/o BLE swelling and SOB.    PT Comments    Very pleasant and states immediately that "they found a rehab place for me, and I am so glad- will be going in the next day or so". Able to ambulate in room with RW and no LOB, s/s of orthostasis. She performed IS as noted. Performed LE ex in seated and reminded to continue to do these. Has improved in postural awareness, endurance, safety awareness, and strength. Should continue to benefit from PT for optimal functional outcomes  Follow Up Recommendations  SNF;Supervision/Assistance - 24 hour;CIR     Equipment Recommendations  Other (comment)(MIGHT CONSIDER ROLLATOR)    Recommendations for Other Services       Precautions / Restrictions Precautions Precautions: Fall Restrictions Weight Bearing Restrictions: No    Mobility  Bed Mobility Overal bed mobility: Modified Independent Bed Mobility: Supine to Sit;Sit to Supine Rolling: Modified independent (Device/Increase time)   Supine to sit: Modified independent (Device/Increase time) Sit to supine: Supervision   General bed mobility comments: HOB elevated and use of bedrail  Transfers Overall transfer level: Needs assistance Equipment used: Rolling walker (2 wheeled) Transfers: Sit to/from [facility] Sit to Stand: Supervision;Min guard Stand pivot transfers: Min guard;Supervision       General transfer comment: Improving when arising from low  surface  Ambulation/Gait Ambulation/Gait assistance: Supervision;Min guard Gait Distance (Feet): 44 Feet Assistive device: Rolling walker (2 wheeled) Gait Pattern/deviations: Step-through pattern;Decreased stride length;Trunk flexed     General Gait Details: She was cooperative and receptive to PT today. Anticipates discharge in the next 24-48 hours   Stairs             Wheelchair Mobility    Modified Rankin (Stroke Patients Only)       Balance Overall balance assessment: Needs assistance Sitting-balance support: Feet supported Sitting balance-Leahy Scale: Good     Standing balance support: During functional activity;Bilateral upper extremity supported Standing balance-Leahy Scale: Fair(F+-G)                              Cognition Arousal/Alertness: Awake/alert Behavior During Therapy: WFL for tasks assessed/performed                                   General Comments: No adverse effects. States that she will be going to a rehab facility in next 24-48 hours and she is so very "happy"      Exercises General Exercises - Lower Extremity Ankle Circles/Pumps: AROM;Seated;10 reps Quad Sets: AROM;Seated;10 reps Hip ABduction/ADduction: AROM;Seated;Both;10 reps Straight Leg Raises: AROM;Seated;10 reps Hip Flexion/Marching: AROM;Seated;10 reps Other Exercises Other Exercises: She as able to achieve 1500, 8 out of 10 reps, and 1750 for 2 reps    General Comments        Pertinent Vitals/Pain Pain Assessment: No/denies pain    Home Living  Prior Function            PT Goals (current goals can now be found in the care plan section) Acute Rehab PT Goals Patient Stated Goal: Agreeable to sitting up in the chair., ambulation and exercises PT Goal Formulation: With patient Time For Goal Achievement: 08/31/19 Potential to Achieve Goals: Good Progress towards PT goals: Progressing toward goals     Frequency    Min 2X/week      PT Plan Current plan remains appropriate    Co-evaluation              AM-PAC PT "6 Clicks" Mobility   Outcome Measure  Help needed turning from your back to your side while in a flat bed without using bedrails?: A Little Help needed moving from lying on your back to sitting on the side of a flat bed without using bedrails?: None Help needed moving to and from a bed to a chair (including a wheelchair)?: A Little Help needed standing up from a chair using your arms (e.g., wheelchair or bedside chair)?: A Little Help needed to walk in hospital room?: A Little Help needed climbing 3-5 steps with a railing? : A Lot 6 Click Score: 18    End of Session   Activity Tolerance: Patient tolerated treatment well Patient left: in chair;with call bell/phone within reach Nurse Communication: Mobility status PT Visit Diagnosis: Other abnormalities of gait and mobility (R26.89);Muscle weakness (generalized) (M62.81)     Time: 0120-0153 PT Time Calculation (min) (ACUTE ONLY): 33 min  Charges:  $Gait Training: 8-22 mins $Therapeutic Exercise: 8-22 mins                  Rollen Sox, PT # [phone] CGV cell   Casandra Doffing 08/23/2019, 5:12 PM

## 2019-08-23 NOTE — Progress Notes (Signed)
Report given to [place] at [facility] facility. All questions/concerns addressed. Pt belonging packed and bedside, awaiting PTAR for transport. Pt on phone with family at this time and reports she will make them aware of her leaving. Nothing further to note.

## 2019-08-23 NOTE — Progress Notes (Signed)
S: Patient has remained stable, her nausea has improved, no vomiting.  O: VS: Temperature 98.6, blood pressure 102/68, heart rate 79, respiratory rate 18, oxygen saturation 98%.  Patient is awake and alert, her lungs are clear to auscultation bilaterally, her abdomen is soft and nontender, no lower extremity edema.  A: Patient is stable to transfer to skilled nursing facility today.  P: Patient was informed about her being medically stable for discharge.  Continue physical therapy at SNF.  Will add metoclopramide as needed 3 times daily before meals.

## 2019-08-23 NOTE — TOC Transition Note (Signed)
Transition of Care [facility]) - CM/SW Discharge Note   Patient Details  Name: TENAE GRAZIOSI MRN: [id number] Date of Birth: 11/01/1950  Transition of Care [facility]) CM/SW Contact:  Marianna Cid, Francetta Found, LCSW Phone Number: 08/23/2019, 4:45 PM   Clinical Narrative:     Clinical Social Worker facilitated patient discharge including contacting patient family and facility to confirm patient discharge plans.  Clinical information faxed to facility and family agreeable with plan.  CSW arranged ambulance transport via [place] to [facility].  RN to call report prior to discharge ([phone]).  Clinical Social Worker will sign off for now as social work intervention is no longer needed. Please consult [place] again if new need arises.   Final next level of care: Skilled Nursing Facility Barriers to Discharge: Barriers Resolved   Patient Goals and CMS Choice Patient states their goals for this hospitalization and ongoing recovery are:: to go to SNF then return home. CMS Medicare.gov Compare Post Acute Care list provided to:: Patient Represenative (must comment) Choice offered to / list presented to : Adult Children  Discharge Placement PASRR number recieved: 08/21/19            Patient chooses bed at: [facility] Patient to be transferred to facility by: [place] Name of family member notified: Alanna Patient and family notified of of transfer: 08/23/19  Discharge Plan and Services                                     Social Determinants of Health (SDOH) Interventions     Readmission Risk Interventions Readmission Risk Prevention Plan 05/16/2019  Transportation Screening Complete  Medication Review ([place]) Complete  HRI or Agenda Complete  SW Recovery Care/Counseling Consult Complete  [place] Not Applicable  Some recent data might be hidden

## 2019-08-23 NOTE — Progress Notes (Signed)
Report called to facility by previous shift RN - Shay. @ [phone], EMS arrived to transfer pt, 1bag of belongings left behind in room after EMS off this floor, call made to security, to advise them of above need for return to obtain bag of pts belongings.  Vital signs at  Time of transfer, 98.7(o) temp, 88/57 (bp), 79 (p), 17 (rr), unable to obtain sats (artificial nails/paint). Pt transported safely to EMS cart and left floor.

## 2019-08-24 LAB — GLUCOSE, CAPILLARY: Glucose-Capillary: 95 mg/dL (ref 70–99)

## 2019-08-29 ENCOUNTER — Telehealth: Payer: Self-pay | Admitting: Family Medicine

## 2019-08-29 DIAGNOSIS — U071 COVID-19: Secondary | ICD-10-CM | POA: Diagnosis not present

## 2019-08-29 DIAGNOSIS — R531 Weakness: Secondary | ICD-10-CM | POA: Diagnosis not present

## 2019-08-29 DIAGNOSIS — E1122 Type 2 diabetes mellitus with diabetic chronic kidney disease: Secondary | ICD-10-CM | POA: Diagnosis not present

## 2019-08-29 DIAGNOSIS — N183 Chronic kidney disease, stage 3 unspecified: Secondary | ICD-10-CM | POA: Diagnosis not present

## 2019-08-29 NOTE — Telephone Encounter (Signed)
We really can not do anything about what is done in rehab----- there must be a reason they changed medications  She would need to talk to dr at the rehab

## 2019-08-29 NOTE — Telephone Encounter (Signed)
Patient wanted to let you know that due to her recent falling at home she has been placed in a Rehab facility. She's not getting the meds that she normally take and wants to know if you can give her a quick call on her cell phone [phone].

## 2019-08-29 NOTE — Telephone Encounter (Signed)
Spoke with patient. Pt states since she was in rehab she didn't know who to talk to about med refills. I believe. The connection with the patient was in and out and I had a very hard time understanding what was said further. Pt did state she hoped to be out rehab by next week.

## 2019-09-02 ENCOUNTER — Encounter (HOSPITAL_COMMUNITY): Payer: Self-pay

## 2019-09-02 ENCOUNTER — Other Ambulatory Visit: Payer: Self-pay

## 2019-09-02 ENCOUNTER — Emergency Department (HOSPITAL_COMMUNITY): Payer: Medicare HMO

## 2019-09-02 ENCOUNTER — Inpatient Hospital Stay (HOSPITAL_COMMUNITY)
Admission: EM | Admit: 2019-09-02 | Discharge: 2019-09-06 | DRG: 690 | Disposition: A | Discharge status: Skilled Nursing Facility | Payer: Medicare HMO | Attending: Internal Medicine | Admitting: Internal Medicine

## 2019-09-02 DIAGNOSIS — Z934 Other artificial openings of gastrointestinal tract status: Secondary | ICD-10-CM

## 2019-09-02 DIAGNOSIS — E875 Hyperkalemia: Secondary | ICD-10-CM | POA: Diagnosis not present

## 2019-09-02 DIAGNOSIS — R630 Anorexia: Secondary | ICD-10-CM | POA: Diagnosis present

## 2019-09-02 DIAGNOSIS — G4730 Sleep apnea, unspecified: Secondary | ICD-10-CM | POA: Diagnosis present

## 2019-09-02 DIAGNOSIS — I25118 Atherosclerotic heart disease of native coronary artery with other forms of angina pectoris: Secondary | ICD-10-CM | POA: Diagnosis not present

## 2019-09-02 DIAGNOSIS — R079 Chest pain, unspecified: Secondary | ICD-10-CM | POA: Diagnosis present

## 2019-09-02 DIAGNOSIS — R072 Precordial pain: Secondary | ICD-10-CM

## 2019-09-02 DIAGNOSIS — Z96652 Presence of left artificial knee joint: Secondary | ICD-10-CM | POA: Diagnosis present

## 2019-09-02 DIAGNOSIS — R0789 Other chest pain: Secondary | ICD-10-CM | POA: Diagnosis not present

## 2019-09-02 DIAGNOSIS — Z885 Allergy status to narcotic agent status: Secondary | ICD-10-CM

## 2019-09-02 DIAGNOSIS — Z880 Allergy status to penicillin: Secondary | ICD-10-CM

## 2019-09-02 DIAGNOSIS — Z743 Need for continuous supervision: Secondary | ICD-10-CM | POA: Diagnosis not present

## 2019-09-02 DIAGNOSIS — K219 Gastro-esophageal reflux disease without esophagitis: Secondary | ICD-10-CM | POA: Diagnosis present

## 2019-09-02 DIAGNOSIS — Z7984 Long term (current) use of oral hypoglycemic drugs: Secondary | ICD-10-CM

## 2019-09-02 DIAGNOSIS — Z86718 Personal history of other venous thrombosis and embolism: Secondary | ICD-10-CM

## 2019-09-02 DIAGNOSIS — N179 Acute kidney failure, unspecified: Secondary | ICD-10-CM | POA: Diagnosis not present

## 2019-09-02 DIAGNOSIS — E1122 Type 2 diabetes mellitus with diabetic chronic kidney disease: Secondary | ICD-10-CM | POA: Diagnosis present

## 2019-09-02 DIAGNOSIS — G8929 Other chronic pain: Secondary | ICD-10-CM | POA: Diagnosis not present

## 2019-09-02 DIAGNOSIS — I2510 Atherosclerotic heart disease of native coronary artery without angina pectoris: Secondary | ICD-10-CM | POA: Diagnosis present

## 2019-09-02 DIAGNOSIS — Z87891 Personal history of nicotine dependence: Secondary | ICD-10-CM

## 2019-09-02 DIAGNOSIS — Z7902 Long term (current) use of antithrombotics/antiplatelets: Secondary | ICD-10-CM

## 2019-09-02 DIAGNOSIS — Z96641 Presence of right artificial hip joint: Secondary | ICD-10-CM | POA: Diagnosis present

## 2019-09-02 DIAGNOSIS — Z91048 Other nonmedicinal substance allergy status: Secondary | ICD-10-CM

## 2019-09-02 DIAGNOSIS — Z8616 Personal history of COVID-19: Secondary | ICD-10-CM

## 2019-09-02 DIAGNOSIS — A4150 Gram-negative sepsis, unspecified: Secondary | ICD-10-CM | POA: Diagnosis not present

## 2019-09-02 DIAGNOSIS — N1831 Chronic kidney disease, stage 3a: Secondary | ICD-10-CM | POA: Diagnosis not present

## 2019-09-02 DIAGNOSIS — Z7901 Long term (current) use of anticoagulants: Secondary | ICD-10-CM

## 2019-09-02 DIAGNOSIS — Z955 Presence of coronary angioplasty implant and graft: Secondary | ICD-10-CM

## 2019-09-02 DIAGNOSIS — I9589 Other hypotension: Secondary | ICD-10-CM | POA: Diagnosis not present

## 2019-09-02 DIAGNOSIS — Z1612 Extended spectrum beta lactamase (ESBL) resistance: Secondary | ICD-10-CM | POA: Diagnosis present

## 2019-09-02 DIAGNOSIS — R1310 Dysphagia, unspecified: Secondary | ICD-10-CM

## 2019-09-02 DIAGNOSIS — J45909 Unspecified asthma, uncomplicated: Secondary | ICD-10-CM | POA: Diagnosis present

## 2019-09-02 DIAGNOSIS — E861 Hypovolemia: Secondary | ICD-10-CM | POA: Diagnosis present

## 2019-09-02 DIAGNOSIS — Z6833 Body mass index (BMI) 33.0-33.9, adult: Secondary | ICD-10-CM

## 2019-09-02 DIAGNOSIS — E785 Hyperlipidemia, unspecified: Secondary | ICD-10-CM | POA: Diagnosis present

## 2019-09-02 DIAGNOSIS — Z820 Family history of epilepsy and other diseases of the nervous system: Secondary | ICD-10-CM

## 2019-09-02 DIAGNOSIS — E1121 Type 2 diabetes mellitus with diabetic nephropathy: Secondary | ICD-10-CM | POA: Diagnosis not present

## 2019-09-02 DIAGNOSIS — K449 Diaphragmatic hernia without obstruction or gangrene: Secondary | ICD-10-CM | POA: Diagnosis present

## 2019-09-02 DIAGNOSIS — I959 Hypotension, unspecified: Secondary | ICD-10-CM | POA: Diagnosis present

## 2019-09-02 DIAGNOSIS — Z888 Allergy status to other drugs, medicaments and biological substances status: Secondary | ICD-10-CM

## 2019-09-02 DIAGNOSIS — Z801 Family history of malignant neoplasm of trachea, bronchus and lung: Secondary | ICD-10-CM

## 2019-09-02 DIAGNOSIS — Z85028 Personal history of other malignant neoplasm of stomach: Secondary | ICD-10-CM

## 2019-09-02 DIAGNOSIS — Z79899 Other long term (current) drug therapy: Secondary | ICD-10-CM

## 2019-09-02 DIAGNOSIS — N390 Urinary tract infection, site not specified: Principal | ICD-10-CM | POA: Diagnosis present

## 2019-09-02 DIAGNOSIS — B9629 Other Escherichia coli [E. coli] as the cause of diseases classified elsewhere: Secondary | ICD-10-CM | POA: Diagnosis present

## 2019-09-02 DIAGNOSIS — G894 Chronic pain syndrome: Secondary | ICD-10-CM | POA: Diagnosis present

## 2019-09-02 DIAGNOSIS — I252 Old myocardial infarction: Secondary | ICD-10-CM

## 2019-09-02 DIAGNOSIS — Z8673 Personal history of transient ischemic attack (TIA), and cerebral infarction without residual deficits: Secondary | ICD-10-CM

## 2019-09-02 DIAGNOSIS — I10 Essential (primary) hypertension: Secondary | ICD-10-CM | POA: Diagnosis not present

## 2019-09-02 DIAGNOSIS — G629 Polyneuropathy, unspecified: Secondary | ICD-10-CM | POA: Diagnosis present

## 2019-09-02 DIAGNOSIS — E876 Hypokalemia: Secondary | ICD-10-CM | POA: Diagnosis not present

## 2019-09-02 DIAGNOSIS — R627 Adult failure to thrive: Secondary | ICD-10-CM | POA: Diagnosis present

## 2019-09-02 DIAGNOSIS — Z833 Family history of diabetes mellitus: Secondary | ICD-10-CM

## 2019-09-02 DIAGNOSIS — I129 Hypertensive chronic kidney disease with stage 1 through stage 4 chronic kidney disease, or unspecified chronic kidney disease: Secondary | ICD-10-CM | POA: Diagnosis present

## 2019-09-02 DIAGNOSIS — N183 Chronic kidney disease, stage 3 unspecified: Secondary | ICD-10-CM | POA: Diagnosis present

## 2019-09-02 DIAGNOSIS — B9620 Unspecified Escherichia coli [E. coli] as the cause of diseases classified elsewhere: Secondary | ICD-10-CM | POA: Diagnosis present

## 2019-09-02 DIAGNOSIS — D573 Sickle-cell trait: Secondary | ICD-10-CM | POA: Diagnosis present

## 2019-09-02 LAB — TROPONIN I (HIGH SENSITIVITY)
Troponin I (High Sensitivity): 9 ng/L (ref <18)
Troponin I (High Sensitivity): 7 ng/L (ref <18)

## 2019-09-02 LAB — URINALYSIS, ROUTINE W REFLEX MICROSCOPIC
Bilirubin Urine: NEGATIVE
Glucose, UA: NEGATIVE mg/dL
Ketones, ur: NEGATIVE mg/dL
Nitrite: NEGATIVE
Protein, ur: 30 mg/dL — AB
Specific Gravity, Urine: 1.006 (ref 1.005–1.030)
WBC, UA: >50 WBC/hpf — ABNORMAL HIGH (ref 0–5)
pH: 7 (ref 5.0–8.0)

## 2019-09-02 LAB — COMPREHENSIVE METABOLIC PANEL
ALT: 31 U/L (ref 0–44)
AST: 47 U/L — ABNORMAL HIGH (ref 15–41)
Albumin: 1.7 g/dL — ABNORMAL LOW (ref 3.5–5.0)
Alkaline Phosphatase: 118 U/L (ref 38–126)
Anion gap: 8 (ref 5–15)
BUN: 11 mg/dL (ref 8–23)
CO2: 26 mmol/L (ref 22–32)
Calcium: 8.5 mg/dL — ABNORMAL LOW (ref 8.9–10.3)
Chloride: 105 mmol/L (ref 98–111)
Creatinine, Ser: 1.7 mg/dL — ABNORMAL HIGH (ref 0.44–1.00)
GFR calc Af Amer: 36 mL/min — ABNORMAL LOW (ref >60)
GFR calc non Af Amer: 31 mL/min — ABNORMAL LOW (ref >60)
Glucose, Bld: 87 mg/dL (ref 70–99)
Potassium: 5.2 mmol/L — ABNORMAL HIGH (ref 3.5–5.1)
Sodium: 139 mmol/L (ref 135–145)
Total Bilirubin: 0.8 mg/dL (ref 0.3–1.2)
Total Protein: 4.6 g/dL — ABNORMAL LOW (ref 6.5–8.1)

## 2019-09-02 LAB — CBC WITH DIFFERENTIAL/PLATELET
Abs Immature Granulocytes: 0.04 10*3/uL (ref 0.00–0.07)
Basophils Absolute: 0.1 10*3/uL (ref 0.0–0.1)
Basophils Relative: 1 %
Eosinophils Absolute: 0.2 10*3/uL (ref 0.0–0.5)
Eosinophils Relative: 2 %
HCT: 29.7 % — ABNORMAL LOW (ref 36.0–46.0)
Hemoglobin: 10.3 g/dL — ABNORMAL LOW (ref 12.0–15.0)
Immature Granulocytes: 1 %
Lymphocytes Relative: 29 %
Lymphs Abs: 2.5 10*3/uL (ref 0.7–4.0)
MCH: 29.4 pg (ref 26.0–34.0)
MCHC: 34.7 g/dL (ref 30.0–36.0)
MCV: 84.9 fL (ref 80.0–100.0)
Monocytes Absolute: 0.8 10*3/uL (ref 0.1–1.0)
Monocytes Relative: 10 %
Neutro Abs: 4.9 10*3/uL (ref 1.7–7.7)
Neutrophils Relative %: 57 %
Platelets: 322 10*3/uL (ref 150–400)
RBC: 3.5 MIL/uL — ABNORMAL LOW (ref 3.87–5.11)
RDW: 19.1 % — ABNORMAL HIGH (ref 11.5–15.5)
WBC: 8.5 10*3/uL (ref 4.0–10.5)
nRBC: 0 % (ref 0.0–0.2)

## 2019-09-02 LAB — BRAIN NATRIURETIC PEPTIDE: B Natriuretic Peptide: 85.8 pg/mL (ref 0.0–100.0)

## 2019-09-02 LAB — CBG MONITORING, ED: Glucose-Capillary: 71 mg/dL (ref 70–99)

## 2019-09-02 LAB — LIPASE, BLOOD: Lipase: 11 U/L (ref 11–51)

## 2019-09-02 MED ORDER — SODIUM CHLORIDE 0.9 % IV BOLUS
1000.0000 mL | Freq: Once | INTRAVENOUS | Status: AC
  Administered 2019-09-02: 19:00:00 1000 mL via INTRAVENOUS

## 2019-09-02 MED ORDER — DARIFENACIN HYDROBROMIDE ER 7.5 MG PO TB24
7.5000 mg | ORAL_TABLET | Freq: Every day | ORAL | Status: DC
  Administered 2019-09-03 – 2019-09-05 (×2): 7.5 mg via ORAL
  Filled 2019-09-02 (×5): qty 1

## 2019-09-02 MED ORDER — PANTOPRAZOLE SODIUM 40 MG IV SOLR
40.0000 mg | Freq: Every day | INTRAVENOUS | Status: DC
  Administered 2019-09-03: 01:00:00 40 mg via INTRAVENOUS
  Filled 2019-09-02: qty 40

## 2019-09-02 MED ORDER — LIDOCAINE-PRILOCAINE 2.5-2.5 % EX CREA
TOPICAL_CREAM | Freq: Once | CUTANEOUS | Status: AC
  Filled 2019-09-02: qty 5

## 2019-09-02 MED ORDER — CIPROFLOXACIN IN D5W 400 MG/200ML IV SOLN
400.0000 mg | Freq: Once | INTRAVENOUS | Status: DC
  Administered 2019-09-02: 400 mg via INTRAVENOUS
  Filled 2019-09-02: qty 200

## 2019-09-02 MED ORDER — SODIUM CHLORIDE 0.9 % IV SOLN: INTRAVENOUS | Status: DC

## 2019-09-02 MED ORDER — SODIUM CHLORIDE 0.9 % IV SOLN: 2.0000 g | Freq: Once | INTRAVENOUS | Status: DC

## 2019-09-02 MED ORDER — ONDANSETRON HCL 4 MG/2ML IJ SOLN: 4.0000 mg | Freq: Four times a day (QID) | INTRAMUSCULAR | Status: DC | PRN

## 2019-09-02 MED ORDER — ONDANSETRON HCL 4 MG PO TABS
4.0000 mg | ORAL_TABLET | Freq: Four times a day (QID) | ORAL | Status: DC | PRN
  Administered 2019-09-02: 22:00:00 4 mg via ORAL
  Filled 2019-09-02: qty 1

## 2019-09-02 MED ORDER — SODIUM CHLORIDE 0.9 % IV SOLN: 2.0000 g | Freq: Two times a day (BID) | INTRAVENOUS | Status: DC

## 2019-09-02 MED ORDER — INSULIN ASPART 100 UNIT/ML ~~LOC~~ SOLN
0.0000 [IU] | Freq: Three times a day (TID) | SUBCUTANEOUS | Status: DC
  Administered 2019-09-03: 2 [IU] via SUBCUTANEOUS

## 2019-09-02 MED ORDER — LACTATED RINGERS IV BOLUS
500.0000 mL | Freq: Once | INTRAVENOUS | Status: AC
  Administered 2019-09-02: 14:00:00 500 mL via INTRAVENOUS

## 2019-09-02 MED ORDER — TRAMADOL HCL 50 MG PO TABS
100.0000 mg | ORAL_TABLET | Freq: Three times a day (TID) | ORAL | Status: DC | PRN
  Administered 2019-09-02 – 2019-09-06 (×3): 100 mg via ORAL
  Filled 2019-09-02 (×3): qty 2

## 2019-09-02 MED ORDER — LACTATED RINGERS IV BOLUS
1500.0000 mL | Freq: Once | INTRAVENOUS | Status: AC
  Administered 2019-09-02: 1500 mL via INTRAVENOUS

## 2019-09-02 NOTE — ED Triage Notes (Signed)
Pt arrived via [facility] from [facility]. Pt is currently receiving rehab there. Pt c/o of sudden left sided chest pain that started at 1030 this morning. Initially pain was 8/10. [place] gave aspirin and 1 nitro. Pt's pain was 4/10 afterwards. Pt has a hx of MI last feb.

## 2019-09-02 NOTE — ED Provider Notes (Signed)
4:20 PM.  Checkout from Dr. Maryan Rued to evaluate blood pressure and labs.  Patient apparently presented from her skilled nursing facility for evaluation of chest pain, which occurred at rest.  She has been nauseated recently.  Patient was treated by EMS during transport, with aspirin and nitroglycerin, and subsequently had resolution of her pain.  She has known coronary disease and was stented about a year ago.  Clinical Course as of Sep 02 1847  Mon Sep 02, 2019  1842 Normal except presence of hemoglobin, protein, leukocytes, WBC, bacteria, squamous epithelial.  Culture ordered  Urinalysis, Routine w reflex microscopic(!) [EW]  1843 Normal  Troponin I (High Sensitivity) [EW]  1843 Normal  Lipase, blood [EW]  1843 Normal except potassium high, creatinine high, calcium low, total protein low, albumin low, AST high.  Comprehensive metabolic panel(!) [EW]  6063 Normal except hemoglobin low  CBC with Differential/Platelet(!) [EW]    Clinical Course User Index [EW] Jessica Bo, MD    EKG Interpretation  Date/Time:  Monday September 02 2019 11:58:00 EST Ventricular Rate:  83 PR Interval:    QRS Duration: 81 QT Interval:  355 QTC Calculation: 418 R Axis:   25 Text Interpretation: Sinus rhythm Borderline low voltage, extremity leads No significant change since last tracing Confirmed by Blanchie Dessert [phone]) on 09/02/2019 4:02:21 PM        Medications  lactated ringers bolus 500 mL (500 mLs Intravenous Bolus from Bag 09/02/19 1425)    Patient Vitals for the past 24 hrs:  BP Temp Temp src Pulse Resp SpO2 Height Weight  09/02/19 1730 (!) 81/61 -- -- (!) 106 15 (!) 63 % -- --  09/02/19 1610 (!) 84/60 -- -- 83 15 99 % -- --  09/02/19 1510 (!) 83/58 -- -- -- -- -- -- --  09/02/19 1510 (!) 78/54 -- -- -- -- -- -- --  09/02/19 1500 (!) 80/69 -- -- 86 20 91 % -- --  09/02/19 1204 (!) 97/50 99.4 F (37.4 C) Oral 84 17 95 % 5' 6"  (1.676 m) 87.5 kg    6:58 PM Reevaluation with update  and discussion. After initial assessment and treatment, an updated evaluation reveals she remains hypotensive, and is alert.  She feels like she is dehydrated.  Oral mucous membranes are very dry at this time.  She is agreeable to hospitalization. [name]   Medical Decision Making: Patient presenting from nursing home, with chest pain.  Found to be hypotensive, which persist despite receiving IV fluids.  Labs abnormal, with elevated creatinine and BUN and potassium.  Urinalysis indicative of UTI.  EKG and delta troponin normal.  Chest x-ray no infiltrate or CHF.  Second bolus of IV fluid given, antibiotics started for urinary tract infection.  Patient will require hospitalization or treatment.  She had Covid infection last month so does not need to be retested at this time.  Doubt ACS, PE or pneumonia.  [name] was evaluated in Emergency Department on 09/02/2019 for the symptoms described in the history of present illness. She was evaluated in the context of the global COVID-19 pandemic, which necessitated consideration that the patient might be at risk for infection with the SARS-CoV-2 virus that causes COVID-19. Institutional protocols and algorithms that pertain to the evaluation of patients at risk for COVID-19 are in a state of rapid change based on information released by regulatory bodies including the CDC and federal and state organizations. These policies and algorithms were followed during the patient's care in the  ED.  CRITICAL CARE- yes Performed by: [name]  .Critical Care Performed by: Jessica Bo, MD Authorized by: Jessica Bo, MD   Critical care provider statement:    Critical care time (minutes):  35   Critical care start time:  09/02/2019 4:10 PM   Critical care end time:  09/02/2019 7:02 PM   Critical care time was exclusive of:  Separately billable procedures and treating other patients   Critical care was necessary to treat or prevent imminent or  life-threatening deterioration of the following conditions:  Circulatory failure   Critical care was time spent personally by me on the following activities:  Blood draw for specimens, development of treatment plan with patient or surrogate, discussions with consultants, evaluation of patient's response to treatment, examination of patient, obtaining history from patient or surrogate, ordering and performing treatments and interventions, ordering and review of laboratory studies, pulse oximetry, re-evaluation of patient's condition, review of old charts and ordering and review of radiographic studies   7:03 PM-Consult complete with hospitalist. Patient case explained and discussed.  He agrees to admit patient for further evaluation and treatment. Call ended at 7:43 PM  Plan: Admit   Jessica Bo, MD 09/02/19 [phone]

## 2019-09-02 NOTE — H&P (Signed)
History and Physical    [name] LKT:[id number] DOB: [birth year] DOA: 09/02/2019  PCP: Ann Held, DO   Patient coming from: SNF   Chief Complaint: Chest pain   HPI: [name] is a 69 y.o. female with medical history significant for coronary artery disease, type 2 diabetes mellitus, chronic kidney disease stage III, asthma, chronic pain, and multiple recent hospitalizations with failure to thrive, now presenting from her SNF for evaluation of chest pain.  Patient reports a few days of nausea without vomiting and then chest pain beginning this morning.  She was treated with aspirin and nitroglycerin prior to arrival in the ED and chest pain has resolved.  Patient has had multiple recent hospitalizations and was discharged just over a week ago after admission for failure to thrive.  She was treated for possible abdominal wall cellulitis during the recent admission, had hypotension and AKI, metoprolol and Imdur were held, and she was discharged to an SNF.  She has not had any recent cough or shortness of breath, denies abdominal pain or dysuria, and reports that the recent abdominal wall rash has resolved.  She reports resolution of her recent bilateral lower extremity swelling.  Denies melena or hematochezia.  ED Course: Upon arrival to the ED, patient is found to be afebrile, slightly tachycardic, and with blood pressure 67/41.  EKG features a sinus rhythm and chest x-ray negative for acute cardiopulmonary disease.  Chemistry panel notable for potassium 5.2, albumin 1.7, and creatinine 1.70, up from an apparent baseline of 1.3.  CBC features a stable chronic normocytic anemia.  High-sensitivity troponin is normal x2 and BNP also normal.  Urinalysis suggestive of possible infection.  Patient was given 1.5 L normal saline and ciprofloxacin.  Review of Systems:  All other systems reviewed and apart from HPI, are negative.  Past Medical History:  Diagnosis Date  . Asthma   .  Degenerative joint disease   . Diabetes mellitus   . Dyspnea   . GERD (gastroesophageal reflux disease)   . Hyperlipidemia    no longer on medication for this  . Hypertension    patient denies  . Myocardial infarction ([place]) 08/2018  . Neuropathy    bilateral hands and feet  . Sickle cell trait ([place])   . Sleep apnea   . stomach ca dx'd 2010   chemo/xrt comp 05/2009  . TIA (transient ischemic attack) 07/2015    Past Surgical History:  Procedure Laterality Date  . APPENDECTOMY    . CESAREAN SECTION    . COLON SURGERY     colonscopy  . COLONOSCOPY WITH PROPOFOL N/A 06/01/2016   Procedure: COLONOSCOPY WITH PROPOFOL;  Surgeon: Wilford Corner, MD;  Location: [facility] ENDOSCOPY;  Service: Endoscopy;  Laterality: N/A;  . CORONARY STENT INTERVENTION N/A 08/29/2018   Procedure: CORONARY STENT INTERVENTION;  Surgeon: Wellington Hampshire, MD;  Location: [facility];  Service: Cardiovascular;  Laterality: N/A;  . CORONARY/GRAFT ACUTE MI REVASCULARIZATION N/A 08/27/2018   Procedure: Coronary/Graft Acute MI Revascularization;  Surgeon: Leonie Man, MD;  Location: [facility];  Service: Cardiovascular;  Laterality: N/A;  . ESOPHAGOGASTRODUODENOSCOPY N/A 10/15/2012   Procedure: ESOPHAGOGASTRODUODENOSCOPY (EGD);  Surgeon: Lear Ng, MD;  Location: [facility] ENDOSCOPY;  Service: Endoscopy;  Laterality: N/A;  . ESOPHAGOGASTRODUODENOSCOPY N/A 01/15/2016   Procedure: ESOPHAGOGASTRODUODENOSCOPY (EGD);  Surgeon: Ronald Lobo, MD;  Location: [facility] ENDOSCOPY;  Service: Endoscopy;  Laterality: N/A;  . HERNIA REPAIR    . LEFT HEART CATH AND CORONARY  ANGIOGRAPHY N/A 08/27/2018   Procedure: LEFT HEART CATH AND CORONARY ANGIOGRAPHY;  Surgeon: Leonie Man, MD;  Location: [facility];  Service: Cardiovascular;  Laterality: N/A;  . SHOULDER SURGERY     Left  . TOTAL HIP ARTHROPLASTY     Right  . TOTAL KNEE ARTHROPLASTY     Left     reports that she quit smoking about 30 years ago. Her  smoking use included cigarettes. She started smoking about 45 years ago. She has a 15.00 pack-year smoking history. She has never used smokeless tobacco. She reports that she does not drink alcohol or use drugs.  Allergies  Allergen Reactions  . Cephalexin Itching, Rash and Other (See Comments)    Sensation of throat closing  . Adhesive [Tape] Other (See Comments)    Burn Skin  . Codeine Other (See Comments)    paranoid  . Zocor [Simvastatin - High Dose] Other (See Comments)    Muscle spasms  . Penicillins Rash    Has patient had a PCN reaction causing immediate rash, facial/tongue/throat swelling, SOB or lightheadedness with hypotension: Yes Has patient had a PCN reaction causing severe rash involving mucus membranes or skin necrosis: No Has patient had a PCN reaction that required hospitalization does not remember.  Has patient had a PCN reaction occurring within the last 10 years: childhood If all of the above answers are "NO", then may proceed with Cephalosporin use.     Family History  Problem Relation Age of Onset  . Diabetes Brother   . Alzheimer's disease Father 40  . Cancer Mother 83  . Diabetes Maternal Grandmother   . Cancer Maternal Grandfather        lung  . Healthy Child   . Suicidality Neg Hx   . Depression Neg Hx   . Dementia Neg Hx   . Anxiety disorder Neg Hx      Prior to Admission medications   Medication Sig Start Date End Date Taking? Authorizing Provider  albuterol (PROVENTIL HFA;VENTOLIN HFA) 108 (90 Base) MCG/ACT inhaler Inhale 2 puffs into the lungs every 6 (six) hours as needed for wheezing or shortness of breath. 11/05/17  Yes Emokpae, Courage, MD  albuterol (PROVENTIL) (2.5 MG/3ML) 0.083% nebulizer solution Take 3 mLs (2.5 mg total) by nebulization every 6 (six) hours as needed for wheezing or shortness of breath. 11/05/17  Yes Emokpae, Courage, MD  apixaban (ELIQUIS) 5 MG TABS tablet Take 1 tablet (5 mg total) by mouth 2 (two) times daily. 08/19/19   Yes Bonnielee Haff, MD  clopidogrel (PLAVIX) 75 MG tablet Take 1 tablet (75 mg total) by mouth daily. 05/13/19  Yes Black, Lezlie Octave, NP  darifenacin (ENABLEX) 7.5 MG 24 hr tablet Take 7.5 mg by mouth daily. 07/23/19  Yes [provider]  feeding supplement, GLUCERNA SHAKE, (GLUCERNA SHAKE) LIQD Take 237 mLs by mouth 3 (three) times daily between meals. 08/14/19  Yes Swayze, Ava, DO  gabapentin (NEURONTIN) 400 MG capsule Take 1 capsule (400 mg total) by mouth 3 (three) times daily. 08/14/19  Yes Swayze, Ava, DO  ipratropium-albuterol (DUONEB) 0.5-2.5 (3) MG/3ML SOLN Take 3 mLs by nebulization every 6 (six) hours as needed. Patient taking differently: Take 3 mLs by nebulization every 6 (six) hours as needed (wheezing/shortness of breath.).  05/26/17  Yes Roma Schanz R, DO  lidocaine-prilocaine (EMLA) cream Apply 1 application topically as needed. Patient taking differently: Apply 1 application topically as needed (to numb port site.).  02/28/19  Yes Ennever,  Rudell Cobb, MD  metFORMIN (GLUCOPHAGE) 500 MG tablet Take 1 tablet (500 mg total) by mouth 2 (two) times daily with a meal. 02/13/19  Yes Philemon Kingdom, MD  metoCLOPramide (REGLAN) 5 MG tablet Take 1 tablet (5 mg total) by mouth every 8 (eight) hours as needed for nausea (before meals as needed for nausea.). 08/23/19  Yes Arrien, Jimmy Picket, MD  metoprolol tartrate (LOPRESSOR) 25 MG tablet Take 0.5 tablets (12.5 mg total) by mouth 2 (two) times daily. 08/14/19  Yes Swayze, Ava, DO  Multiple Vitamins-Minerals (CENTRUM SILVER ADULT 50+) TABS Take 1 tablet once a day Patient taking differently: Take 1 tablet by mouth daily. Take 1 tablet once a day 02/20/19  Yes Lowne Lyndal Pulley R, DO  nitroGLYCERIN (NITROSTAT) 0.4 MG SL tablet Place 1 tablet (0.4 mg total) under the tongue every 5 (five) minutes as needed. Patient taking differently: Place 0.4 mg under the tongue every 5 (five) minutes as needed for chest pain.  01/14/19  Yes Lorretta Harp, MD  nystatin (MYCOSTATIN/NYSTOP) powder Apply topically 3 (three) times daily. 08/14/19  Yes Swayze, Ava, DO  pantoprazole (PROTONIX) 40 MG tablet Take 1 tablet (40 mg total) by mouth daily. 07/16/19  Yes Roma Schanz R, DO  rosuvastatin (CRESTOR) 20 MG tablet Take 1 tablet (20 mg total) by mouth daily at 6 PM. 08/14/19  Yes Swayze, Ava, DO  sertraline (ZOLOFT) 50 MG tablet Take 1 tablet (50 mg total) by mouth daily. 02/20/19  Yes Roma Schanz R, DO  sucralfate (CARAFATE) 1 g tablet TAKE 1 TABLET BY MOUTH TWICE DAILY Patient taking differently: Take 1 g by mouth 2 (two) times daily.  02/05/19  Yes [place], Holli Humbles, NP  traMADol (ULTRAM) 50 MG tablet Take 2 tablets (100 mg total) by mouth 3 (three) times daily as needed for moderate pain. 08/20/19  Yes Bonnielee Haff, MD  traZODone (DESYREL) 50 MG tablet Take 0.5 tablets (25 mg total) by mouth at bedtime as needed for sleep. 08/14/19  Yes Swayze, Ava, DO  glucose blood (ONETOUCH VERIO) test strip Use as instructed to check blood sugar 3 times a day 09/04/18   Philemon Kingdom, MD    Physical Exam: Vitals:   09/02/19 1852 09/02/19 1854 09/02/19 1900 09/02/19 1934  BP:  (!) 81/62 (!) 78/50 (!) 96/56  Pulse: 63   (!) 53  Resp: (!) 22 (!) 22 18 14   Temp: 97.6 F (36.4 C)     TempSrc: Rectal     SpO2:    92%  Weight:      Height:        Constitutional: NAD, calm  Eyes: PERTLA, lids and conjunctivae normal ENMT: Mucous membranes are dry. Posterior pharynx clear of any exudate or lesions.   Neck: normal, supple, no masses, no thyromegaly Respiratory: clear to auscultation bilaterally, no wheezing, no crackles. No accessory muscle use.  Cardiovascular: S1 & S2 heard, regular rate and rhythm. No extremity edema.   Abdomen: No distension, no tenderness, soft. Bowel sounds active.  Musculoskeletal: no clubbing / cyanosis. No joint deformity upper and lower extremities.   Skin: no significant rashes, lesions, ulcers.  Warm, dry, well-perfused. Neurologic: No facial asymmetry. Sensation intact. Moving all extremities.  Psychiatric: Alert and oriented x 3. Pleasant and cooperative.    Labs and Imaging on Admission: I have personally reviewed following labs and imaging studies  CBC: Recent Labs  Lab 09/02/19 1217  WBC 8.5  NEUTROABS 4.9  HGB 10.3*  HCT 29.7*  MCV 84.9  PLT 638   Basic Metabolic Panel: Recent Labs  Lab 09/02/19 1217  NA 139  K 5.2*  CL 105  CO2 26  GLUCOSE 87  BUN 11  CREATININE 1.70*  CALCIUM 8.5*   GFR: Estimated Creatinine Clearance: 35.8 mL/min (A) (by C-G formula based on SCr of 1.7 mg/dL (H)). Liver Function Tests: Recent Labs  Lab 09/02/19 1217  AST 47*  ALT 31  ALKPHOS 118  BILITOT 0.8  PROT 4.6*  ALBUMIN 1.7*   Recent Labs  Lab 09/02/19 1217  LIPASE 11   No results for input(s): AMMONIA in the last 168 hours. Coagulation Profile: No results for input(s): INR, PROTIME in the last 168 hours. Cardiac Enzymes: No results for input(s): CKTOTAL, CKMB, CKMBINDEX, TROPONINI in the last 168 hours. BNP (last 3 results) No results for input(s): PROBNP in the last 8760 hours. HbA1C: No results for input(s): HGBA1C in the last 72 hours. CBG: No results for input(s): GLUCAP in the last 168 hours. Lipid Profile: No results for input(s): CHOL, HDL, LDLCALC, TRIG, CHOLHDL, LDLDIRECT in the last 72 hours. Thyroid Function Tests: No results for input(s): TSH, T4TOTAL, FREET4, T3FREE, THYROIDAB in the last 72 hours. Anemia Panel: No results for input(s): VITAMINB12, FOLATE, FERRITIN, TIBC, IRON, RETICCTPCT in the last 72 hours. Urine analysis:    Component Value Date/Time   COLORURINE YELLOW 09/02/2019 1707   APPEARANCEUR CLOUDY (A) 09/02/2019 1707   LABSPEC 1.006 09/02/2019 1707   LABSPEC 1.015 04/06/2015 0939   PHURINE 7.0 09/02/2019 1707   GLUCOSEU NEGATIVE 09/02/2019 1707   HGBUR SMALL (A) 09/02/2019 1707   BILIRUBINUR NEGATIVE 09/02/2019 1707    BILIRUBINUR 1+ 10/01/2018 1146   KETONESUR NEGATIVE 09/02/2019 1707   PROTEINUR 30 (A) 09/02/2019 1707   UROBILINOGEN 0.2 10/01/2018 1146   UROBILINOGEN 0.2 04/06/2015 0939   NITRITE NEGATIVE 09/02/2019 1707   LEUKOCYTESUR LARGE (A) 09/02/2019 1707   Sepsis Labs: @LABRCNTIP (procalcitonin:4,lacticidven:4) )No results found for this or any previous visit (from the past 240 hour(s)).   Radiological Exams on Admission: DG Chest Port 1 View  Result Date: 09/02/2019 CLINICAL DATA:  Chest pain. EXAM: PORTABLE CHEST 1 VIEW COMPARISON:  Chest x-ray dated August 16, 2019. FINDINGS: Unchanged right chest wall port catheter. The heart size and mediastinal contours are within normal limits. Normal pulmonary vascularity. No focal consolidation, pleural effusion, or pneumothorax. No acute osseous abnormality. IMPRESSION: No active disease. Electronically Signed   By: Titus Dubin M.D.   On: 09/02/2019 12:28    EKG: Independently reviewed. Sinus rhythm.   Assessment/Plan   1. Hypotension - Presents with chest pain that resolved prior to arrival and is found to have BP as low as 67/41  - She had low BP in hospital recently, Imdur and metoprolol were held, and metoprolol since restarted  - Patient denies cough, dysuria, or abdominal pain, states that recent rash has resolved, and there is no meningismus or cellulitis noted  - No bleeding, H&H stable  - She has had low BP recently, was given NTG prior to arrival, and appears hypovolemic  - Plan to culture but hold further antibiotics for now, continue IVF hydration, hold metoprolol    2. Acute kidney injury superimposed on CKD III  - SCr is 1.70 on admission, up from an apparent baseline of 1.3  - Likely prerenal azotemia in setting of hypotension  - She is being fluid-resuscitated in ED  - Check FENa, renally-dose medications, repeat chem panel in am    3.  Chest pain; CAD  - Patient has hx of CAD developed chest pain this am, was treated with  ASA and NTG at SNF, and reports resolution prior to arrival  - HS troponin is normal x2, CXR unremarkable, and EKG with no acute ischemic features  - Patient suspects this is due to GERD but also reports improvement with nitroglycerin that was given pta  - Continue statin and Plavix, hold metoprolol given hypotension, continue PPI and Carafate   4. Type II DM  - A1c was 8/7% in January 2021  - Hold metformin and use SSI with Novolog as needed for now    5. History of DVT   - No evidence for acute DVT/PE, continue Eliquis    6. Chronic pain  - No pain complaints on admission  - Continue home-regimen     7. Asthma  - No wheeze or cough on admission  - Continue inhalers    DVT prophylaxis: Eliquis  Code Status: Full  Family Communication: Discussed with patient   Disposition Plan: From SNF. Anticipate likely discharge back to SNF once BP stable and sepsis ruled-out or treated.  Consults called: None  Admission status: Observation     Vianne Bulls, MD [facility] Pager: See [email or web address]  If 7AM-7PM, please contact the daytime attending [email or web address]  09/02/2019, 8:02 PM

## 2019-09-02 NOTE — ED Provider Notes (Signed)
[facility] EMERGENCY DEPARTMENT Provider Note   CSN: [id number] Arrival date & time: 09/02/19  1149     History Chief Complaint  Patient presents with  . Chest Pain    [name] is a 69 y.o. female.  Pt is a 69 y/o female with hx of diabetes mellitus type 2, hyperlipidemia, asthma, recent hospitalization for COVID-19 earlier in January was rehospitalized on January 14 due to generalized weakness, falls who is currently at a SNF for rehab presenting today with CP.  Patient reports that over the last few days she has had significant nausea worse when she wakes up in the morning but intermittent throughout the day and she has been eating very little.  Today she woke up and did have nausea but then at 1045 started having chest pain as described below.  Patient was given nitroglycerin and aspirin by EMS with improvement of her symptoms and she currently states she is pain-free.  She denies any recent stool changes and no black stool.  She has had no new shortness of breath but has been occasionally coughing and yesterday and today noticed a thick green sputum.  She denies fever or abdominal pain.  She is currently denying any nausea at this time.  She does report a history of coronary artery disease with a heart attack 1 year ago resulting in catheterization and stent placement.  She is also currently taking Eliquis for a DVT and on Plavix for her heart disease.  She denies any urinary complaints.  The history is provided by the patient and medical records.  Chest Pain Pain location:  Substernal area Pain quality: pressure and tightness   Pain radiates to:  Does not radiate Pain severity:  Moderate Onset quality:  Sudden Duration: lasted for >30 min until EMS gave NTG and ASA. Timing:  Constant Progression:  Resolved Chronicity:  Recurrent Context comment:  Started while lying down in bed today Relieved by:  Aspirin and nitroglycerin Worsened by:  Nothing Ineffective  treatments:  None tried Associated symptoms: anorexia, cough and nausea   Associated symptoms: no abdominal pain, no fever, no lower extremity edema, no shortness of breath, no vomiting and no weakness        Past Medical History:  Diagnosis Date  . Asthma   . Degenerative joint disease   . Diabetes mellitus   . Dyspnea   . GERD (gastroesophageal reflux disease)   . Hyperlipidemia    no longer on medication for this  . Hypertension    patient denies  . Myocardial infarction ([place]) 08/2018  . Neuropathy    bilateral hands and feet  . Sickle cell trait ([place])   . Sleep apnea   . stomach ca dx'd 2010   chemo/xrt comp 05/2009  . TIA (transient ischemic attack) 07/2015    Patient Active Problem List   Diagnosis Date Noted  . Drug-induced polyneuropathy ([place]) 08/18/2019  . FTT (failure to thrive) in adult 08/17/2019  . AKI (acute kidney injury) ([place]) 08/17/2019  . SOB (shortness of breath) 08/16/2019  . Lower extremity edema 08/16/2019  . Fall 08/08/2019  . Hypertension 08/08/2019  . COVID-19 virus infection 07/25/2019  . Elevated LFTs 07/25/2019  . COVID-19 07/25/2019  . Allergic contact dermatitis due to adhesives 05/23/2019  . Insomnia 05/23/2019  . SIRS (systemic inflammatory response syndrome) ([place]) 05/14/2019  . NSTEMI (non-ST elevated myocardial infarction) ([place]) 05/14/2019  . Hyperglycemia   . Neutropenia ([place])   . CAD (coronary artery disease)   .  Obesity with serious comorbidity 04/16/2019  . Gastroenteritis 03/27/2019  . Falls frequently 03/27/2019  . Acute gastroenteritis 03/22/2019  . Hematoma of scalp 03/14/2019  . Vitamin D deficiency 11/09/2018  . Muscle spasm 10/19/2018  . Chest pain 09/07/2018  . Palpitations 09/07/2018  . Other fatigue 09/07/2018  . Acute ST elevation myocardial infarction (STEMI) of inferior wall ([place]) 08/27/2018    Class: Hospitalized for  . Atherosclerotic heart disease of native coronary artery with other forms of angina  pectoris ([place]) 08/27/2018    Class: Diagnosis of  . Acute vaginitis 08/20/2018  . Hyperlipidemia associated with type 2 diabetes mellitus ([place]) 04/09/2018  . Bronchitis 11/02/2017  . Sepsis ([place]) 11/02/2017  . Leucocytosis 11/02/2017  . Moderate persistent asthma with acute exacerbation 05/26/2017  . Influenza A 05/26/2017  . Acute pain of right shoulder 03/07/2017  . Acute bilateral low back pain without sciatica 03/07/2017  . Chronic pain 03/07/2017  . Fatty liver 03/24/2016  . Weakness 03/23/2016  . Unexplained weight loss 03/23/2016  . Melena 03/23/2016  . Hypotension 02/03/2016  . Syncope 02/03/2016  . Hypomagnesemia 02/03/2016  . Acute kidney injury superimposed on chronic kidney disease ([place]) 02/03/2016  . Gastrointestinal dysmotility 01/20/2016  . CKD (chronic kidney disease) stage 3, GFR 30-59 ml/min (H[facility]) 01/20/2016  . Abscess of right thigh 01/20/2016  . Chronic pain syndrome 01/15/2016  . Diarrhea 01/15/2016  . Generalized abdominal pain 01/14/2016  . Type 2 diabetes with nephropathy ([place]) 12/30/2015  . TIA (transient ischemic attack) 07/2015  . Morbid obesity due to excess calories ([place]) 05/05/2015  . Diabetic polyneuropathy associated with type 2 diabetes mellitus ([place]) 01/02/2015  . Acute bacterial sinusitis 01/02/2015  . Onychomycosis 11/04/2014  . Pain in lower limb 11/04/2014  . Multinodular goiter (nontoxic) 10/16/2014  . History of CVA (cerebrovascular accident) 08/11/2014  . Iron deficiency anemia 05/20/2014  . Major depressive disorder, single episode, moderate ([place]) 05/20/2014  . Dizziness of unknown cause 04/28/2014  . MDD (major depressive disorder), recurrent episode, moderate ([place]) 01/28/2014  . GERD (gastroesophageal reflux disease) 01/02/2014  . Nausea 10/15/2012  . Stomach cancer ([place]) 12/10/2008  . CHRONIC RESPIRATORY FAILURE 01/03/2008  . Hyperlipidemia 06/27/2007  . OBESITY, MORBID 06/27/2007  . SLEEP APNEA, OBSTRUCTIVE 06/27/2007  .  Generalized idiopathic epilepsy and epileptic syndromes, not intractable, with status epilepticus ([place]) 06/27/2007  . Asthma 06/27/2007  . OSTEOARTHRITIS  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 06/27/2007    Past Surgical History:  Procedure Laterality Date  . APPENDECTOMY    . CESAREAN SECTION    . COLON SURGERY     colonscopy  . COLONOSCOPY WITH PROPOFOL N/A 06/01/2016   Procedure: COLONOSCOPY WITH PROPOFOL;  Surgeon: Wilford Corner, MD;  Location: [facility] ENDOSCOPY;  Service: Endoscopy;  Laterality: N/A;  . CORONARY STENT INTERVENTION N/A 08/29/2018   Procedure: CORONARY STENT INTERVENTION;  Surgeon: Wellington Hampshire, MD;  Location: [facility] CV LAB;  Service: Cardiovascular;  Laterality: N/A;  . CORONARY/GRAFT ACUTE MI REVASCULARIZATION N/A 08/27/2018   Procedure: Coronary/Graft Acute MI Revascularization;  Surgeon: Leonie Man, MD;  Location: [facility] CV LAB;  Service: Cardiovascular;  Laterality: N/A;  . ESOPHAGOGASTRODUODENOSCOPY N/A 10/15/2012   Procedure: ESOPHAGOGASTRODUODENOSCOPY (EGD);  Surgeon: Lear Ng, MD;  Location: [facility] ENDOSCOPY;  Service: Endoscopy;  Laterality: N/A;  . ESOPHAGOGASTRODUODENOSCOPY N/A 01/15/2016   Procedure: ESOPHAGOGASTRODUODENOSCOPY (EGD);  Surgeon: Ronald Lobo, MD;  Location: [facility] ENDOSCOPY;  Service: Endoscopy;  Laterality: N/A;  . HERNIA REPAIR    . LEFT HEART  CATH AND CORONARY ANGIOGRAPHY N/A 08/27/2018   Procedure: LEFT HEART CATH AND CORONARY ANGIOGRAPHY;  Surgeon: Leonie Man, MD;  Location: [facility] CV LAB;  Service: Cardiovascular;  Laterality: N/A;  . SHOULDER SURGERY     Left  . TOTAL HIP ARTHROPLASTY     Right  . TOTAL KNEE ARTHROPLASTY     Left     OB History    Gravida  4   Para  4   Term      Preterm      AB      Living  4     SAB      TAB      Ectopic      Multiple      Live Births              Family History  Problem Relation Age of Onset  . Diabetes Brother   . Alzheimer's disease Father 6    . Cancer Mother 60  . Diabetes Maternal Grandmother   . Cancer Maternal Grandfather        lung  . Healthy Child   . Suicidality Neg Hx   . Depression Neg Hx   . Dementia Neg Hx   . Anxiety disorder Neg Hx     Social History   Tobacco Use  . Smoking status: Former Smoker    Packs/day: 1.00    Years: 15.00    Pack years: 15.00    Types: Cigarettes    Start date: 09/27/1973    Quit date: 10/15/1988    Years since quitting: 30.9  . Smokeless tobacco: Never Used  . Tobacco comment: quit smoking 14 years ago  Substance Use Topics  . Alcohol use: No    Alcohol/week: 0.0 standard drinks  . Drug use: No    Home Medications Prior to Admission medications   Medication Sig Start Date End Date Taking? Authorizing Provider  albuterol (PROVENTIL HFA;VENTOLIN HFA) 108 (90 Base) MCG/ACT inhaler Inhale 2 puffs into the lungs every 6 (six) hours as needed for wheezing or shortness of breath. 11/05/17   Roxan Hockey, MD  albuterol (PROVENTIL) (2.5 MG/3ML) 0.083% nebulizer solution Take 3 mLs (2.5 mg total) by nebulization every 6 (six) hours as needed for wheezing or shortness of breath. 11/05/17   Roxan Hockey, MD  apixaban (ELIQUIS) 5 MG TABS tablet Take 1 tablet (5 mg total) by mouth 2 (two) times daily. 08/19/19   Bonnielee Haff, MD  clopidogrel (PLAVIX) 75 MG tablet Take 1 tablet (75 mg total) by mouth daily. 05/13/19   Black, Lezlie Octave, NP  darifenacin (ENABLEX) 7.5 MG 24 hr tablet Take 7.5 mg by mouth daily. 07/23/19   [provider]  feeding supplement, GLUCERNA SHAKE, (GLUCERNA SHAKE) LIQD Take 237 mLs by mouth 3 (three) times daily between meals. 08/14/19   Swayze, Ava, DO  gabapentin (NEURONTIN) 400 MG capsule Take 1 capsule (400 mg total) by mouth 3 (three) times daily. 08/14/19   Swayze, Ava, DO  glucose blood (ONETOUCH VERIO) test strip Use as instructed to check blood sugar 3 times a day 09/04/18   Philemon Kingdom, MD  ipratropium-albuterol (DUONEB) 0.5-2.5 (3) MG/3ML  SOLN Take 3 mLs by nebulization every 6 (six) hours as needed. Patient taking differently: Take 3 mLs by nebulization every 6 (six) hours as needed (wheezing/shortness of breath.).  05/26/17   Ann Held, DO  lidocaine-prilocaine (EMLA) cream Apply 1 application topically as needed. Patient taking differently: Apply 1 application  topically as needed (to numb port site.).  02/28/19   Volanda Napoleon, MD  metFORMIN (GLUCOPHAGE) 500 MG tablet Take 1 tablet (500 mg total) by mouth 2 (two) times daily with a meal. 02/13/19   Philemon Kingdom, MD  metoCLOPramide (REGLAN) 5 MG tablet Take 1 tablet (5 mg total) by mouth every 8 (eight) hours as needed for nausea (before meals as needed for nausea.). 08/23/19   Arrien, Jimmy Picket, MD  metoprolol tartrate (LOPRESSOR) 25 MG tablet Take 0.5 tablets (12.5 mg total) by mouth 2 (two) times daily. 08/14/19   Swayze, Ava, DO  Multiple Vitamins-Minerals (CENTRUM SILVER ADULT 50+) TABS Take 1 tablet once a day Patient taking differently: Take 1 tablet by mouth daily. Take 1 tablet once a day 02/20/19   Carollee Herter, Alferd Apa, DO  nitroGLYCERIN (NITROSTAT) 0.4 MG SL tablet Place 1 tablet (0.4 mg total) under the tongue every 5 (five) minutes as needed. Patient taking differently: Place 0.4 mg under the tongue every 5 (five) minutes as needed for chest pain.  01/14/19   Lorretta Harp, MD  nystatin (MYCOSTATIN/NYSTOP) powder Apply topically 3 (three) times daily. 08/14/19   Swayze, Ava, DO  pantoprazole (PROTONIX) 40 MG tablet Take 1 tablet (40 mg total) by mouth daily. 07/16/19   Ann Held, DO  rosuvastatin (CRESTOR) 20 MG tablet Take 1 tablet (20 mg total) by mouth daily at 6 PM. 08/14/19   Swayze, Ava, DO  sertraline (ZOLOFT) 50 MG tablet Take 1 tablet (50 mg total) by mouth daily. 02/20/19   Roma Schanz R, DO  sucralfate (CARAFATE) 1 g tablet TAKE 1 TABLET BY MOUTH TWICE DAILY Patient taking differently: Take 1 g by mouth 2 (two) times  daily.  02/05/19   [place], Holli Humbles, NP  traMADol (ULTRAM) 50 MG tablet Take 2 tablets (100 mg total) by mouth 3 (three) times daily as needed for moderate pain. 08/20/19   Bonnielee Haff, MD  traZODone (DESYREL) 50 MG tablet Take 0.5 tablets (25 mg total) by mouth at bedtime as needed for sleep. 08/14/19   Swayze, Ava, DO    Allergies    Cephalexin, Adhesive [tape], Codeine, Zocor [simvastatin - high dose], and Penicillins  Review of Systems   Review of Systems  Constitutional: Negative for fever.  Respiratory: Positive for cough. Negative for shortness of breath.   Cardiovascular: Positive for chest pain.  Gastrointestinal: Positive for anorexia and nausea. Negative for abdominal pain and vomiting.  Neurological: Negative for weakness.  All other systems reviewed and are negative.   Physical Exam Updated Vital Signs BP (!) 97/50 (BP Location: Right Arm)   Pulse 84   Temp 99.4 F (37.4 C) (Oral)   Resp 17   Ht 5' 6"  (1.676 m)   Wt 87.5 kg   SpO2 95%   BMI 31.15 kg/m   Physical Exam Vitals and nursing note reviewed.  Constitutional:      General: She is not in acute distress.    Appearance: She is well-developed and normal weight.  HENT:     Head: Normocephalic and atraumatic.     Mouth/Throat:     Mouth: Mucous membranes are moist.  Eyes:     Pupils: Pupils are equal, round, and reactive to light.     Comments: Pale conjunctiva  Cardiovascular:     Rate and Rhythm: Normal rate and regular rhythm.     Heart sounds: Normal heart sounds. No murmur. No friction rub.  Pulmonary:  Effort: Pulmonary effort is normal.     Breath sounds: Normal breath sounds. No wheezing or rales.  Abdominal:     General: Bowel sounds are normal. There is no distension.     Palpations: Abdomen is soft.     Tenderness: There is no abdominal tenderness. There is no guarding or rebound.  Musculoskeletal:        General: No tenderness. Normal range of motion.     Right lower leg: No  edema.     Left lower leg: No edema.     Comments: No edema  Skin:    General: Skin is warm and dry.     Coloration: Skin is pale.     Findings: No rash.  Neurological:     General: No focal deficit present.     Mental Status: She is alert and oriented to person, place, and time. Mental status is at baseline.     Cranial Nerves: No cranial nerve deficit.  Psychiatric:        Mood and Affect: Mood normal.        Behavior: Behavior normal.        Thought Content: Thought content normal.     ED Results / Procedures / Treatments   Labs (all labs ordered are listed, but only abnormal results are displayed) Labs Reviewed  CBC WITH DIFFERENTIAL/PLATELET - Abnormal; Notable for the following components:      Result Value   RBC 3.50 (*)    Hemoglobin 10.3 (*)    HCT 29.7 (*)    RDW 19.1 (*)    All other components within normal limits  COMPREHENSIVE METABOLIC PANEL - Abnormal; Notable for the following components:   Potassium 5.2 (*)    Creatinine, Ser 1.70 (*)    Calcium 8.5 (*)    Total Protein 4.6 (*)    Albumin 1.7 (*)    AST 47 (*)    GFR calc non Af Amer 31 (*)    GFR calc Af Amer 36 (*)    All other components within normal limits  LIPASE, BLOOD  BRAIN NATRIURETIC PEPTIDE  URINALYSIS, ROUTINE W REFLEX MICROSCOPIC  TROPONIN I (HIGH SENSITIVITY)  TROPONIN I (HIGH SENSITIVITY)    EKG EKG Interpretation  Date/Time:  Monday September 02 2019 11:58:00 EST Ventricular Rate:  83 PR Interval:    QRS Duration: 81 QT Interval:  355 QTC Calculation: 418 R Axis:   25 Text Interpretation: Sinus rhythm Borderline low voltage, extremity leads No significant change since last tracing Confirmed by Blanchie Dessert ([id number]) on 09/02/2019 4:02:21 PM    Radiology DG Chest Port 1 View  Result Date: 09/02/2019 CLINICAL DATA:  Chest pain. EXAM: PORTABLE CHEST 1 VIEW COMPARISON:  Chest x-ray dated August 16, 2019. FINDINGS: Unchanged right chest wall port catheter. The heart size and  mediastinal contours are within normal limits. Normal pulmonary vascularity. No focal consolidation, pleural effusion, or pneumothorax. No acute osseous abnormality. IMPRESSION: No active disease. Electronically Signed   By: Titus Dubin M.D.   On: 09/02/2019 12:28    Procedures Procedures (including critical care time)  Medications Ordered in ED Medications - No data to display  ED Course  I have reviewed the triage vital signs and the nursing notes.  Pertinent labs & imaging results that were available during my care of the patient were reviewed by me and considered in my medical decision making (see chart for details).    MDM Rules/Calculators/A&P  Elderly female with multiple medical problems presenting today with chest pain.  Concern for ACS versus GI origin versus pneumonia vs anemia.  Lower suspicion for PE, dissection.  Patient is currently on Eliquis for a DVT making PE less likely.  She did receive nitroglycerin and aspirin in route with resolution of her symptoms.  She has been having nausea over the last few days worse when she wakes up in the morning.  This seems to be recurrent for the patient as when she was hospitalized at the end of January she had similar symptoms and had metoclopramide to be used as needed.  Patient states the food at the facility where she is living is not very good and she has not had much of an appetite recently.  Also patient appears pale and will rule out anemia.  He denies any shortness of breath and states she has not had a lot of coughing but over the last few days has had a thick sputum.  Patient does have borderline blood pressure of 97/50 however she did just receive nitroglycerin so we will follow.  Temperature of 99.4.  EKG today without acute findings.  Blood work and chest x-ray pending.  1:49 PM Patient's labs are consistent with mild AKI with creatinine of 1.7 from baseline of 1.3 and mild hyperkalemia with a potassium of  5.2.  Patient's hemoglobin is stable at 10, white blood cell count is within normal limits.  Troponin today is 9 and BNP is within normal limits.  Chest x-ray without acute findings.  Patient remains chest pain-free.  She states she has not been eating or drinking much related to nausea and do feel that she is most likely mildly dehydrated.  Patient given a small bolus of fluid.  Also will p.o. challenge.  Will repeat troponin.  And continue to monitor. Pt's BP has been in the 34'D and 56'Y systolic.  Will recheck after bolus.   Final Clinical Impression(s) / ED Diagnoses Final diagnoses:  None    Rx / DC Orders ED Discharge Orders    None       Blanchie Dessert, MD 09/02/19 [phone]

## 2019-09-02 NOTE — ED Notes (Signed)
Discussed need to COVID swab pt for admission with Dr. Myna Hidalgo. Pt was positive two weeks ago and initial detected positive in Dec 2020.   Per Dr. Myna Hidalgo infection prevention has stated there is no need to retest pt. For this admission

## 2019-09-03 ENCOUNTER — Observation Stay (HOSPITAL_BASED_OUTPATIENT_CLINIC_OR_DEPARTMENT_OTHER): Payer: Medicare HMO

## 2019-09-03 DIAGNOSIS — M6281 Muscle weakness (generalized): Secondary | ICD-10-CM | POA: Diagnosis not present

## 2019-09-03 DIAGNOSIS — R079 Chest pain, unspecified: Secondary | ICD-10-CM

## 2019-09-03 DIAGNOSIS — N1831 Chronic kidney disease, stage 3a: Secondary | ICD-10-CM | POA: Diagnosis not present

## 2019-09-03 DIAGNOSIS — Z91048 Other nonmedicinal substance allergy status: Secondary | ICD-10-CM | POA: Diagnosis not present

## 2019-09-03 DIAGNOSIS — Z885 Allergy status to narcotic agent status: Secondary | ICD-10-CM | POA: Diagnosis not present

## 2019-09-03 DIAGNOSIS — D573 Sickle-cell trait: Secondary | ICD-10-CM | POA: Diagnosis present

## 2019-09-03 DIAGNOSIS — I2510 Atherosclerotic heart disease of native coronary artery without angina pectoris: Secondary | ICD-10-CM | POA: Diagnosis present

## 2019-09-03 DIAGNOSIS — R1310 Dysphagia, unspecified: Secondary | ICD-10-CM | POA: Diagnosis not present

## 2019-09-03 DIAGNOSIS — I959 Hypotension, unspecified: Secondary | ICD-10-CM | POA: Diagnosis not present

## 2019-09-03 DIAGNOSIS — E1121 Type 2 diabetes mellitus with diabetic nephropathy: Secondary | ICD-10-CM | POA: Diagnosis not present

## 2019-09-03 DIAGNOSIS — G894 Chronic pain syndrome: Secondary | ICD-10-CM | POA: Diagnosis present

## 2019-09-03 DIAGNOSIS — R12 Heartburn: Secondary | ICD-10-CM | POA: Diagnosis not present

## 2019-09-03 DIAGNOSIS — R627 Adult failure to thrive: Secondary | ICD-10-CM | POA: Diagnosis present

## 2019-09-03 DIAGNOSIS — E785 Hyperlipidemia, unspecified: Secondary | ICD-10-CM | POA: Diagnosis present

## 2019-09-03 DIAGNOSIS — Z1612 Extended spectrum beta lactamase (ESBL) resistance: Secondary | ICD-10-CM | POA: Diagnosis not present

## 2019-09-03 DIAGNOSIS — N179 Acute kidney failure, unspecified: Secondary | ICD-10-CM | POA: Diagnosis not present

## 2019-09-03 DIAGNOSIS — R2689 Other abnormalities of gait and mobility: Secondary | ICD-10-CM | POA: Diagnosis not present

## 2019-09-03 DIAGNOSIS — Z980 Intestinal bypass and anastomosis status: Secondary | ICD-10-CM | POA: Diagnosis not present

## 2019-09-03 DIAGNOSIS — I129 Hypertensive chronic kidney disease with stage 1 through stage 4 chronic kidney disease, or unspecified chronic kidney disease: Secondary | ICD-10-CM | POA: Diagnosis not present

## 2019-09-03 DIAGNOSIS — E1122 Type 2 diabetes mellitus with diabetic chronic kidney disease: Secondary | ICD-10-CM | POA: Diagnosis not present

## 2019-09-03 DIAGNOSIS — E861 Hypovolemia: Secondary | ICD-10-CM | POA: Diagnosis not present

## 2019-09-03 DIAGNOSIS — I9589 Other hypotension: Secondary | ICD-10-CM | POA: Diagnosis not present

## 2019-09-03 DIAGNOSIS — Z6833 Body mass index (BMI) 33.0-33.9, adult: Secondary | ICD-10-CM | POA: Diagnosis not present

## 2019-09-03 DIAGNOSIS — I82401 Acute embolism and thrombosis of unspecified deep veins of right lower extremity: Secondary | ICD-10-CM | POA: Diagnosis not present

## 2019-09-03 DIAGNOSIS — R0789 Other chest pain: Secondary | ICD-10-CM | POA: Diagnosis not present

## 2019-09-03 DIAGNOSIS — Z888 Allergy status to other drugs, medicaments and biological substances status: Secondary | ICD-10-CM | POA: Diagnosis not present

## 2019-09-03 DIAGNOSIS — Z8616 Personal history of COVID-19: Secondary | ICD-10-CM | POA: Diagnosis not present

## 2019-09-03 DIAGNOSIS — N390 Urinary tract infection, site not specified: Secondary | ICD-10-CM | POA: Diagnosis not present

## 2019-09-03 DIAGNOSIS — G4730 Sleep apnea, unspecified: Secondary | ICD-10-CM | POA: Diagnosis present

## 2019-09-03 DIAGNOSIS — B9620 Unspecified Escherichia coli [E. coli] as the cause of diseases classified elsewhere: Secondary | ICD-10-CM | POA: Diagnosis not present

## 2019-09-03 DIAGNOSIS — R278 Other lack of coordination: Secondary | ICD-10-CM | POA: Diagnosis not present

## 2019-09-03 DIAGNOSIS — K224 Dyskinesia of esophagus: Secondary | ICD-10-CM | POA: Diagnosis not present

## 2019-09-03 DIAGNOSIS — Z86718 Personal history of other venous thrombosis and embolism: Secondary | ICD-10-CM | POA: Diagnosis not present

## 2019-09-03 DIAGNOSIS — A4150 Gram-negative sepsis, unspecified: Secondary | ICD-10-CM | POA: Diagnosis not present

## 2019-09-03 DIAGNOSIS — R630 Anorexia: Secondary | ICD-10-CM | POA: Diagnosis not present

## 2019-09-03 DIAGNOSIS — K219 Gastro-esophageal reflux disease without esophagitis: Secondary | ICD-10-CM | POA: Diagnosis present

## 2019-09-03 DIAGNOSIS — Z85028 Personal history of other malignant neoplasm of stomach: Secondary | ICD-10-CM | POA: Diagnosis not present

## 2019-09-03 DIAGNOSIS — E875 Hyperkalemia: Secondary | ICD-10-CM | POA: Diagnosis not present

## 2019-09-03 DIAGNOSIS — J45909 Unspecified asthma, uncomplicated: Secondary | ICD-10-CM | POA: Diagnosis present

## 2019-09-03 LAB — ECHOCARDIOGRAM COMPLETE
Height: 66 in
Weight: 3149.93 oz

## 2019-09-03 LAB — LACTIC ACID, PLASMA
Lactic Acid, Venous: 2.8 mmol/L (ref 0.5–1.9)
Lactic Acid, Venous: 2.1 mmol/L (ref 0.5–1.9)

## 2019-09-03 LAB — COMPREHENSIVE METABOLIC PANEL
ALT: 30 U/L (ref 0–44)
AST: 41 U/L (ref 15–41)
Albumin: 1.7 g/dL — ABNORMAL LOW (ref 3.5–5.0)
Alkaline Phosphatase: 123 U/L (ref 38–126)
Anion gap: 8 (ref 5–15)
BUN: 9 mg/dL (ref 8–23)
CO2: 26 mmol/L (ref 22–32)
Calcium: 8.5 mg/dL — ABNORMAL LOW (ref 8.9–10.3)
Chloride: 106 mmol/L (ref 98–111)
Creatinine, Ser: 1.58 mg/dL — ABNORMAL HIGH (ref 0.44–1.00)
GFR calc Af Amer: 39 mL/min — ABNORMAL LOW (ref >60)
GFR calc non Af Amer: 34 mL/min — ABNORMAL LOW (ref >60)
Glucose, Bld: 88 mg/dL (ref 70–99)
Potassium: 4.3 mmol/L (ref 3.5–5.1)
Sodium: 140 mmol/L (ref 135–145)
Total Bilirubin: 0.7 mg/dL (ref 0.3–1.2)
Total Protein: 4.9 g/dL — ABNORMAL LOW (ref 6.5–8.1)

## 2019-09-03 LAB — CBC WITH DIFFERENTIAL/PLATELET
Abs Immature Granulocytes: 0.02 10*3/uL (ref 0.00–0.07)
Basophils Absolute: 0.1 10*3/uL (ref 0.0–0.1)
Basophils Relative: 1 %
Eosinophils Absolute: 0.2 10*3/uL (ref 0.0–0.5)
Eosinophils Relative: 2 %
HCT: 30.7 % — ABNORMAL LOW (ref 36.0–46.0)
Hemoglobin: 10.5 g/dL — ABNORMAL LOW (ref 12.0–15.0)
Immature Granulocytes: 0 %
Lymphocytes Relative: 30 %
Lymphs Abs: 2.9 10*3/uL (ref 0.7–4.0)
MCH: 29.4 pg (ref 26.0–34.0)
MCHC: 34.2 g/dL (ref 30.0–36.0)
MCV: 86 fL (ref 80.0–100.0)
Monocytes Absolute: 0.8 10*3/uL (ref 0.1–1.0)
Monocytes Relative: 8 %
Neutro Abs: 5.6 10*3/uL (ref 1.7–7.7)
Neutrophils Relative %: 59 %
Platelets: 332 10*3/uL (ref 150–400)
RBC: 3.57 MIL/uL — ABNORMAL LOW (ref 3.87–5.11)
RDW: 18.8 % — ABNORMAL HIGH (ref 11.5–15.5)
WBC: 9.6 10*3/uL (ref 4.0–10.5)
nRBC: 0 % (ref 0.0–0.2)

## 2019-09-03 LAB — GLUCOSE, CAPILLARY
Glucose-Capillary: 74 mg/dL (ref 70–99)
Glucose-Capillary: 157 mg/dL — ABNORMAL HIGH (ref 70–99)
Glucose-Capillary: 75 mg/dL (ref 70–99)
Glucose-Capillary: 120 mg/dL — ABNORMAL HIGH (ref 70–99)

## 2019-09-03 LAB — SODIUM, URINE, RANDOM: Sodium, Ur: 56 mmol/L

## 2019-09-03 LAB — CREATININE, URINE, RANDOM: Creatinine, Urine: 40.54 mg/dL

## 2019-09-03 LAB — CORTISOL: Cortisol, Plasma: 8.5 ug/dL

## 2019-09-03 MED ORDER — ACETAMINOPHEN 650 MG RE SUPP: 650.0000 mg | Freq: Four times a day (QID) | RECTAL | Status: DC | PRN

## 2019-09-03 MED ORDER — SERTRALINE HCL 50 MG PO TABS
50.0000 mg | ORAL_TABLET | Freq: Every day | ORAL | Status: DC
  Administered 2019-09-03 – 2019-09-06 (×3): 50 mg via ORAL
  Filled 2019-09-03 (×4): qty 1

## 2019-09-03 MED ORDER — PANTOPRAZOLE SODIUM 40 MG PO TBEC: 40.0000 mg | DELAYED_RELEASE_TABLET | Freq: Every day | ORAL | Status: DC

## 2019-09-03 MED ORDER — SUCRALFATE 1 G PO TABS
1.0000 g | ORAL_TABLET | Freq: Two times a day (BID) | ORAL | Status: DC
  Administered 2019-09-03 (×3): 1 g via ORAL
  Filled 2019-09-03 (×3): qty 1

## 2019-09-03 MED ORDER — APIXABAN 5 MG PO TABS
5.0000 mg | ORAL_TABLET | Freq: Two times a day (BID) | ORAL | Status: DC
  Administered 2019-09-03 (×2): 5 mg via ORAL
  Filled 2019-09-03 (×2): qty 1

## 2019-09-03 MED ORDER — ALBUTEROL SULFATE (2.5 MG/3ML) 0.083% IN NEBU: 2.5000 mg | INHALATION_SOLUTION | Freq: Four times a day (QID) | RESPIRATORY_TRACT | Status: DC | PRN

## 2019-09-03 MED ORDER — ACETAMINOPHEN 325 MG PO TABS: 650.0000 mg | ORAL_TABLET | Freq: Four times a day (QID) | ORAL | Status: DC | PRN

## 2019-09-03 MED ORDER — PANTOPRAZOLE SODIUM 40 MG PO TBEC
40.0000 mg | DELAYED_RELEASE_TABLET | Freq: Two times a day (BID) | ORAL | Status: DC
  Administered 2019-09-03 – 2019-09-06 (×6): 40 mg via ORAL
  Filled 2019-09-03 (×7): qty 1

## 2019-09-03 MED ORDER — GABAPENTIN 400 MG PO CAPS
400.0000 mg | ORAL_CAPSULE | Freq: Three times a day (TID) | ORAL | Status: DC
  Administered 2019-09-03 – 2019-09-06 (×9): 400 mg via ORAL
  Filled 2019-09-03 (×10): qty 1

## 2019-09-03 MED ORDER — PERFLUTREN LIPID MICROSPHERE
1.0000 mL | INTRAVENOUS | Status: AC | PRN
  Administered 2019-09-03: 08:00:00 2 mL via INTRAVENOUS
  Filled 2019-09-03: qty 10

## 2019-09-03 MED ORDER — ROSUVASTATIN CALCIUM 20 MG PO TABS
20.0000 mg | ORAL_TABLET | Freq: Every day | ORAL | Status: DC
  Administered 2019-09-03 – 2019-09-05 (×3): 20 mg via ORAL
  Filled 2019-09-03 (×3): qty 1

## 2019-09-03 MED ORDER — CIPROFLOXACIN IN D5W 400 MG/200ML IV SOLN
400.0000 mg | Freq: Two times a day (BID) | INTRAVENOUS | Status: DC
  Administered 2019-09-03 (×2): 400 mg via INTRAVENOUS
  Filled 2019-09-03 (×3): qty 200

## 2019-09-03 MED ORDER — SODIUM CHLORIDE 0.9 % IV SOLN: INTRAVENOUS | Status: DC

## 2019-09-03 MED ORDER — CLOPIDOGREL BISULFATE 75 MG PO TABS
75.0000 mg | ORAL_TABLET | Freq: Every day | ORAL | Status: DC
  Administered 2019-09-03: 75 mg via ORAL
  Filled 2019-09-03: qty 1

## 2019-09-03 MED ORDER — SODIUM CHLORIDE 0.9% FLUSH
3.0000 mL | Freq: Two times a day (BID) | INTRAVENOUS | Status: DC
  Administered 2019-09-03 – 2019-09-06 (×2): 3 mL via INTRAVENOUS

## 2019-09-03 NOTE — Evaluation (Signed)
Occupational Therapy Evaluation Patient Details Name: [name] MRN: [id number] DOB: [birth year] Today's Date: 09/03/2019    History of Present Illness 69 y.o. female with past medical history significant for DM2, HLD, Asthma who presented to the ED for progressive cough, dyspnea over the past week (1/02). admitted to [facility] w/ COVID (dx 12/31) and d/c home (at her and family insistence)1/4. Readmitted to hosital w/ falls and COVID PNA on 1/14, d/c home once more 1/20. And returned to ED 1/22 w/ c/o BLE swelling and SOB.   Clinical Impression   PTA, pt was at SNF after recent admission to [place] for [place]; she reports she was perform BADLs and functional mobility with RW and supervision from staff. Pt currently requiring Min A for LB ADLs and sit<>stand due to decreased strength and balance. Pt limited by low BP and returned to supine; notified RN. Pt would benefit from further acute OT to facilitate safe dc. Recommend return to SNF for further OT to optimize safety, independence with ADLs, and return to PLOF.   Orthostatic BPs   Supine 78/58 (64)  Sitting at EOB 86/63 (72)  Standing 56/37 (44)   Supine after standing 106/96 (96)       Follow Up Recommendations  SNF;Supervision/Assistance - 24 hour    Equipment Recommendations  None recommended by OT    Recommendations for Other Services PT consult     Precautions / Restrictions Precautions Precautions: Fall Precaution Comments: Pt reports she was falling every day Restrictions Weight Bearing Restrictions: No      Mobility Bed Mobility Overal bed mobility: Needs Assistance Bed Mobility: Supine to Sit;Sit to Supine     Supine to sit: Supervision Sit to supine: Supervision   General bed mobility comments: Supervision for safety  Transfers Overall transfer level: Needs assistance Equipment used: Rolling walker (2 wheeled) Transfers: Sit to/from [facility] Sit to Stand: Min assist         General  transfer comment: Min A for power up into standing. +2 for safety. Cues for hand placement with RW    Balance Overall balance assessment: Needs assistance Sitting-balance support: Feet supported Sitting balance-Leahy Scale: Good     Standing balance support: During functional activity;Bilateral upper extremity supported Standing balance-Leahy Scale: Poor Standing balance comment: Reliant on UE support                           ADL either performed or assessed with clinical judgement   ADL Overall ADL's : Needs assistance/impaired Eating/Feeding: Independent;Sitting   Grooming: Set up;Supervision/safety;Sitting   Upper Body Bathing: Set up;Supervision/ safety;Sitting   Lower Body Bathing: Minimal assistance;Sit to/from stand   Upper Body Dressing : Supervision/safety;Set up;Sitting   Lower Body Dressing: Minimal assistance;Sit to/from stand               Functional mobility during ADLs: Minimal assistance;Rolling walker(Sit<>stand only) General ADL Comments: Limited due to low BP. Pt performing bed mobility and then sit<>stand with Min A.     Vision Baseline Vision/History: Wears glasses Wears Glasses: Reading only       Perception     Praxis      Pertinent Vitals/Pain Pain Assessment: No/denies pain     Hand Dominance Right   Extremity/Trunk Assessment Upper Extremity Assessment Upper Extremity Assessment: Generalized weakness   Lower Extremity Assessment Lower Extremity Assessment: Defer to PT evaluation   Cervical / Trunk Assessment Cervical / Trunk Assessment: Other exceptions Cervical / Trunk  Exceptions: Increased body habitus   Communication Communication Communication: No difficulties   Cognition Arousal/Alertness: Awake/alert Behavior During Therapy: WFL for tasks assessed/performed Overall Cognitive Status: Within Functional Limits for tasks assessed                                 General Comments: Requiring  additional cues and increased time periodically. Following commands and agreeable to therapy   General Comments  SpO2 90s on 2L O2.    Exercises     Shoulder Instructions      Home Living Family/patient expects to be discharged to:: Private residence Living Arrangements: Alone Available Help at Discharge: Other (Comment)(No one to assist at home. Has a fall alert system.) Type of Home: Apartment Home Access: Stairs to enter Entrance Stairs-Number of Steps: 1 Entrance Stairs-Rails: Right Home Layout: One level     Bathroom Shower/Tub: Teacher, early years/pre: Standard     Home Equipment: Environmental consultant - 4 wheels;Shower seat   Additional Comments: Information from last admission. Pt planning to return to SNF for further rehab      Prior Functioning/Environment Level of Independence: Independent with assistive device(s)(Pt reports decline over the last month)        Comments: Was performing ADLs prior to recent admission for COVID.         OT Problem List: Decreased strength;Decreased range of motion;Decreased activity tolerance;Impaired balance (sitting and/or standing);Cardiopulmonary status limiting activity      OT Treatment/Interventions: Self-care/ADL training;Therapeutic exercise;Neuromuscular education;Energy conservation;DME and/or AE instruction;Therapeutic activities;Patient/family education;Balance training    OT Goals(Current goals can be found in the care plan section) Acute Rehab OT Goals Patient Stated Goal: "I want to get stronger so I can go home" OT Goal Formulation: With patient Time For Goal Achievement: 09/01/19 Potential to Achieve Goals: Good  OT Frequency: Min 3X/week   Barriers to D/C:            Co-evaluation PT/OT/SLP Co-Evaluation/Treatment: Yes Reason for Co-Treatment: For patient/therapist safety;To address functional/ADL transfers(Fatigue and BP)   OT goals addressed during session: ADL's and self-care      AM-PAC OT "6  Clicks" Daily Activity     Outcome Measure Help from another person eating meals?: None Help from another person taking care of personal grooming?: A Little Help from another person toileting, which includes using toliet, bedpan, or urinal?: A Little Help from another person bathing (including washing, rinsing, drying)?: A Little Help from another person to put on and taking off regular upper body clothing?: A Little Help from another person to put on and taking off regular lower body clothing?: A Little 6 Click Score: 19   End of Session Equipment Utilized During Treatment: Rolling walker Nurse Communication: Mobility status  Activity Tolerance: Patient tolerated treatment well Patient left: with call bell/phone within reach;in bed;with bed alarm set  OT Visit Diagnosis: Unsteadiness on feet (R26.81);Muscle weakness (generalized) (M62.81)                Time: 1740-8144 OT Time Calculation (min): 18 min Charges:  OT General Charges $OT Visit: 1 Visit OT Evaluation $OT Eval Low Complexity: 1 Low  Antanisha Mohs MSOT, OTR/L Acute Rehab Pager: [phone] Office: [place] 09/03/2019, 4:29 PM

## 2019-09-03 NOTE — Progress Notes (Signed)
PHARMACY NOTE:  ANTIMICROBIAL RENAL DOSAGE ADJUSTMENT  Current antimicrobial regimen includes a mismatch between antimicrobial dosage and estimated renal function.  As per policy approved by the Pharmacy & Therapeutics and Medical Executive Committees, the antimicrobial dosage will be adjusted accordingly.  Current antimicrobial dosage:  cipro 221m IV q12h  Indication: UTI (PCN and recent cephalosporin allergy documented 08/14/19)  Renal Function:  Estimated Creatinine Clearance: 38.9 mL/min (A) (by C-G formula based on SCr of 1.58 mg/dL (H)).     Antimicrobial dosage has been changed to:  Cipro 4044mIV q12h  Additional comments:   Thank you for allowing pharmacy to be a part of this patient's care.  AnHildred LaserPharmD Clinical Pharmacist **Pharmacist phone directory can now be found on [facility] (PW [email or web address]).  Listed under [place]

## 2019-09-03 NOTE — Consult Note (Signed)
   [name]   09/03/2019  [name] 09/24/1950 [id number]   Notified of the [place] NP of the patient's hospitalization coming from a skilled nursing facility.  Chart reviewed for hospitalization.  Patient remains extreme high risk for unplanned readmission.  Chart review from MD progress notes includes but not limited to the following:  69 year old with history of coronary artery disease status post RCA stent 12 months ago on Plavix, recent COVID-19 infection, right lower extremity DVT on Eliquis, multiple recent hospitalizations mostly related to poor appetite, epigastric pain and dehydration, type 2 diabetes, chronic kidney disease stage III, asthma, chronic pain syndrome presented from skilled nursing facility with complaints of few days of nausea without vomiting and midsternal chest pain that began yesterday morning.    Call placed to the patient at the hospital bedside phone.  HIPAA verified with name, DOB, address.  Patient able to speak in full sentences.  She was not up to much talking at this time. Patient agreeable to further follow up calls and follow up with [place] [facility] services as appropriate. Plan:  Follow for disposition and needs.    For questions,  [name], RN BSN [facility] Liaison  [phone] business mobile phone Toll free office [phone]  Fax number: [phone] [email or web address]

## 2019-09-03 NOTE — Progress Notes (Signed)
Critical value Lactic Acid- 2.8  Paged [facility] on-call with this information

## 2019-09-03 NOTE — Progress Notes (Signed)
PROGRESS NOTE    [name]  JEH:[id number] DOB: 29-Jun-1951 DOA: 09/02/2019 PCP: Ann Held, DO    Brief Narrative:  69 year old with history of coronary artery disease status post RCA stent 12 months ago on Plavix, recent COVID-19 infection, right lower extremity DVT on Eliquis, multiple recent hospitalizations mostly related to poor appetite, epigastric pain and dehydration, type 2 diabetes, chronic kidney disease stage III, asthma, chronic pain syndrome presented from skilled nursing facility with complaints of few days of nausea without vomiting and midsternal chest pain that began yesterday morning.  With history of coronary artery disease, she was treated with nitroglycerin and aspirin prior to arrival with chest pain improved. In the emergency room she was afebrile, she was slightly tachycardic.  Blood pressures well 67/41.  EKG was sinus rhythm.  Chest x-ray normal.  Potassium 5.2, albumin 1.7.  Creatinine 1.7 with baseline creatinine about 1.3 last month.  Troponins were nonischemic.  Lactic acid was slightly elevated.  Urinalysis was abnormal. Treated with IV fluids and admitted to the hospital.   Assessment & Plan:   Principal Problem:   Hypotension Active Problems:   Asthma   History of CVA (cerebrovascular accident)   Type 2 diabetes with nephropathy (HCC)   Acute renal failure superimposed on stage 3 chronic kidney disease (HCC)   Chronic pain   Chest pain   CAD (coronary artery disease)   FTT (failure to thrive) in adult   History of DVT (deep vein thrombosis)  Hypotension: Suspect multifactorial, probably with hypovolemia from poor appetite and use of nitroglycerin prior to arrival.  She does have recurrent episodes of low blood pressures that improves with fluid.  Recurrent nausea, vomiting, poor appetite with history of stage Ib gastric cancer status post resection 10 years ago: This is her third admission with similar symptoms including poor appetite and  mediastinal chest pain.  She is on Protonix once a day.  Will change to twice a day Protonix.  Patient may benefit with upper GI endoscopy evaluation to rule out any ulcers, recurrent cancer or reflux disease.  She is also on Plavix and Eliquis. Will hold Plavix and Eliquis today.  Will discuss with her gastroenterologist about evaluation. Patient had recent CAT scan of the abdomen that did not show any evidence of abnormal gastric anatomy.  Chest pain: Serial troponins and EKG nonischemic.  2D echocardiogram done today shows no evidence of abnormal wall motion.  Her pain is mostly mid sternal, starting from xiphisternum and going to the throat.  Atypical chest pain.  Will do GI evaluation.  She received Plavix today, hold further Plavix.  Last stent was more than 12 months ago.  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  Hold Metformin.  Keep on sliding scale insulin while in the hospital.  Chronic pain syndrome: Patient is already on tramadol that she will continue.  Acute UTI present on admission: Suspected.  Grossly abnormal urine.  Will start patient on ciprofloxacin pending final cultures.  Allergy to penicillin and cephalosporins.  Acute kidney injury with history of underlying chronic kidney disease stage IIIa: Patient was treated with IV fluids.  Baseline creatinine 1.3.  Continues to improve.  Continue maintenance IV fluids.  Called and discussed with Dr. Cristina Gong with [facility], will hold off her anticoagulation.  Patient will probably need upper GI endoscopy.   DVT prophylaxis: Eliquis, last dose 09/03/2019, holding Code Status: Full code Family Communication: Try to call patient's daughter, and unable to reach, will try again.  Disposition Plan: patient is from short-term skilled rehab. Anticipated DC to short-term skilled rehab, Barriers to discharge active symptoms, may need inpatient treatment with inpatient procedures like EGD.  Patient presents with significant symptoms  including persistent hypotension, unable to eat, nausea and chest pain.  Also probably has UTI, cultures are pending. Her symptoms cannot be managed outpatient, she will need continuous monitoring, adjustment of medications, IV fluids.   Consultants:   Gastroenterology  Procedures:   None  Antimicrobials:  Anti-infectives (From admission, onward)   Start     Dose/Rate Route Frequency Ordered Stop   09/03/19 0930  ciprofloxacin (CIPRO) IVPB 400 mg     400 mg 200 mL/hr over 60 Minutes Intravenous Every 12 hours 09/03/19 0812     09/02/19 2015  ceFEPIme (MAXIPIME) 2 g in sodium chloride 0.9 % 100 mL IVPB  Status:  Discontinued     2 g 200 mL/hr over 30 Minutes Intravenous Every 12 hours 09/02/19 2003 09/02/19 2034   09/02/19 2000  aztreonam (AZACTAM) 2 g in sodium chloride 0.9 % 100 mL IVPB  Status:  Discontinued     2 g 200 mL/hr over 30 Minutes Intravenous  Once 09/02/19 1948 09/02/19 2003   09/02/19 1900  ciprofloxacin (CIPRO) IVPB 400 mg  Status:  Discontinued     400 mg 200 mL/hr over 60 Minutes Intravenous  Once 09/02/19 1850 09/02/19 2039         Subjective: Patient was seen and examined.  Patient mostly complains of epigastric pain going to her throat and nausea all the time. Also has some leg pain, however that is long-term. Denies any vomiting.  No left precordial chest pain.  Objective: Vitals:   09/03/19 0120 09/03/19 0315 09/03/19 0723 09/03/19 0737  BP:  (!) 91/52 (!) 83/56 (!) 89/56  Pulse: 85 86 83   Resp: 17 16 20    Temp:   98.1 F (36.7 C)   TempSrc:   Oral   SpO2: 100% 100% 100%   Weight:      Height:        Intake/Output Summary (Last 24 hours) at 09/03/2019 1102 Last data filed at 09/03/2019 0723 Gross per 24 hour  Intake 1910.84 ml  Output 500 ml  Net 1410.84 ml   Filed Weights   09/02/19 1204 09/02/19 2316  Weight: 87.5 kg 89.3 kg    Examination:  General exam: Appears calm and comfortable , patient is on room air. Respiratory system:  Clear to auscultation. Respiratory effort normal.  No added sounds.  She does have chest wall right-sided Port-A-Cath that is nontender. Cardiovascular system: S1 & S2 heard, RRR. No JVD, murmurs, rubs, gallops or clicks.  Gastrointestinal system: Abdomen is nondistended, soft and no definite tenderness. No organomegaly or masses felt. Normal bowel sounds heard. Central nervous system: Alert and oriented. No focal neurological deficits. Extremities: Symmetric 5 x 5 power. Skin: No rashes, lesions or ulcers Psychiatry: Judgement and insight appear normal. Mood & affect appropriate.     Data Reviewed: I have personally reviewed following labs and imaging studies  CBC: Recent Labs  Lab 09/02/19 1217 09/03/19 0009  WBC 8.5 9.6  NEUTROABS 4.9 5.6  HGB 10.3* 10.5*  HCT 29.7* 30.7*  MCV 84.9 86.0  PLT 322 124   Basic Metabolic Panel: Recent Labs  Lab 09/02/19 1217 09/03/19 0009  NA 139 140  K 5.2* 4.3  CL 105 106  CO2 26 26  GLUCOSE 87 88  BUN 11 9  CREATININE 1.70* 1.58*  CALCIUM 8.5* 8.5*   GFR: Estimated Creatinine Clearance: 38.9 mL/min (A) (by C-G formula based on SCr of 1.58 mg/dL (H)). Liver Function Tests: Recent Labs  Lab 09/02/19 1217 09/03/19 0009  AST 47* 41  ALT 31 30  ALKPHOS 118 123  BILITOT 0.8 0.7  PROT 4.6* 4.9*  ALBUMIN 1.7* 1.7*   Recent Labs  Lab 09/02/19 1217  LIPASE 11   No results for input(s): AMMONIA in the last 168 hours. Coagulation Profile: No results for input(s): INR, PROTIME in the last 168 hours. Cardiac Enzymes: No results for input(s): CKTOTAL, CKMB, CKMBINDEX, TROPONINI in the last 168 hours. BNP (last 3 results) No results for input(s): PROBNP in the last 8760 hours. HbA1C: No results for input(s): HGBA1C in the last 72 hours. CBG: Recent Labs  Lab 09/02/19 2227 09/03/19 0742  GLUCAP 71 74   Lipid Profile: No results for input(s): CHOL, HDL, LDLCALC, TRIG, CHOLHDL, LDLDIRECT in the last 72 hours. Thyroid Function  Tests: No results for input(s): TSH, T4TOTAL, FREET4, T3FREE, THYROIDAB in the last 72 hours. Anemia Panel: No results for input(s): VITAMINB12, FOLATE, FERRITIN, TIBC, IRON, RETICCTPCT in the last 72 hours. Sepsis Labs: Recent Labs  Lab 09/03/19 0009  LATICACIDVEN 2.8*    No results found for this or any previous visit (from the past 240 hour(s)).       Radiology Studies: DG Chest Port 1 View  Result Date: 09/02/2019 CLINICAL DATA:  Chest pain. EXAM: PORTABLE CHEST 1 VIEW COMPARISON:  Chest x-ray dated August 16, 2019. FINDINGS: Unchanged right chest wall port catheter. The heart size and mediastinal contours are within normal limits. Normal pulmonary vascularity. No focal consolidation, pleural effusion, or pneumothorax. No acute osseous abnormality. IMPRESSION: No active disease. Electronically Signed   By: Titus Dubin M.D.   On: 09/02/2019 12:28   ECHOCARDIOGRAM COMPLETE  Result Date: 09/03/2019    ECHOCARDIOGRAM REPORT   Patient Name:   [name] Feild Date of Exam: 09/03/2019 Medical Rec #:  [id number]    Height:       66.0 in Accession #:    [id number]   Weight:       196.9 lb Date of Birth:  09/09/1950    BSA:          1.99 m Patient Age:    75 years     BP:           89/56 mmHg Patient Gender: F            HR:           76 bpm. Exam Location:  Inpatient Procedure: 2D Echo, Color Doppler, Cardiac Doppler and Intracardiac            Opacification Agent Indications:    R07.9* Chest pain, unspecified  History:        Patient has prior history of Echocardiogram examinations, most                 recent 08/28/2018. Risk Factors:Hypertension, Diabetes,                 Dyslipidemia and Sleep Apnea.  Sonographer:    Raquel Sarna Senior RDCS Referring Phys: [id number] [place]  1. Left ventricular ejection fraction, by estimation, is 65 to 70%. The left ventricle has normal function. The left ventrical has no regional wall motion abnormalities. Left ventricular diastolic parameters are  consistent with Grade I diastolic dysfunction (impaired relaxation).  2. Right ventricular systolic function is normal. The  right ventricular size is normal. There is mildly elevated pulmonary artery systolic pressure.  3. Mild mitral valve regurgitation.  4. The aortic valve is normal in structure and function. Aortic valve regurgitation is trivial . No aortic stenosis is present.  5. The inferior vena cava is normal in size with greater than 50% respiratory variability, suggesting right atrial pressure of 3 mmHg. FINDINGS  Left Ventricle: Left ventricular ejection fraction, by estimation, is 65 to 70%. The left ventricle has normal function. The left ventricle has no regional wall motion abnormalities. The left ventricular internal cavity size was normal in size. There is  no left ventricular hypertrophy. Left ventricular diastolic parameters are consistent with Grade I diastolic dysfunction (impaired relaxation). Right Ventricle: The right ventricular size is normal. No increase in right ventricular wall thickness. Right ventricular systolic function is normal. There is mildly elevated pulmonary artery systolic pressure. The tricuspid regurgitant velocity is 2.58  m/s, and with an assumed right atrial pressure of 8 mmHg, the estimated right ventricular systolic pressure is 80.8 mmHg. Left Atrium: Left atrial size was normal in size. Right Atrium: Right atrial size was normal in size. Pericardium: There is no evidence of pericardial effusion. Mitral Valve: The mitral valve is normal in structure and function. Normal mobility of the mitral valve leaflets. Mild mitral valve regurgitation. No evidence of mitral valve stenosis. Tricuspid Valve: The tricuspid valve is normal in structure. Tricuspid valve regurgitation is mild . No evidence of tricuspid stenosis. Aortic Valve: The aortic valve is normal in structure and function. Aortic valve regurgitation is trivial. No aortic stenosis is present. Pulmonic Valve: The  pulmonic valve was normal in structure. Pulmonic valve regurgitation is not visualized. No evidence of pulmonic stenosis. Aorta: The aortic root is normal in size and structure. Venous: The inferior vena cava is normal in size with greater than 50% respiratory variability, suggesting right atrial pressure of 3 mmHg. The inferior vena cava and the hepatic vein show a normal flow pattern. IAS/Shunts: No atrial level shunt detected by color flow Doppler.  LEFT VENTRICLE PLAX 2D LVIDd:         4.52 cm  Diastology LVIDs:         2.66 cm  LV e' lateral:   8.27 cm/s LV PW:         1.12 cm  LV E/e' lateral: 7.0 LV IVS:        0.90 cm  LV e' medial:    5.55 cm/s LVOT diam:     1.90 cm  LV E/e' medial:  10.4 LV SV:         61.24 ml LV SV Index:   32.56 LVOT Area:     2.84 cm  RIGHT VENTRICLE RV S prime:     15.10 cm/s TAPSE (M-mode): 2.3 cm LEFT ATRIUM             Index       RIGHT ATRIUM           Index LA diam:        3.80 cm 1.91 cm/m  RA Area:     18.30 cm LA Vol (A2C):   42.8 ml 21.55 ml/m RA Volume:   45.90 ml  23.11 ml/m LA Vol (A4C):   55.4 ml 27.89 ml/m LA Biplane Vol: 52.1 ml 26.23 ml/m  AORTIC VALVE LVOT Vmax:   103.00 cm/s LVOT Vmean:  73.300 cm/s LVOT VTI:    0.216 m  AORTA Ao Root diam: 2.90 cm Ao Asc  diam:  3.60 cm MITRAL VALVE                        TRICUSPID VALVE MV Area (PHT): 2.95 cm             TR Peak grad:   26.6 mmHg MV Decel Time: 257 msec             TR Vmax:        258.00 cm/s MV E velocity: 57.70 cm/s 103 cm/s MV A velocity: 80.50 cm/s 70.3 cm/s SHUNTS MV E/A ratio:  0.72       1.5       Systemic VTI:  0.22 m                                     Systemic Diam: 1.90 cm Fransico Him MD Electronically signed by Fransico Him MD Signature Date/Time: 09/03/2019/8:48:53 AM    Final         Scheduled Meds: . darifenacin  7.5 mg Oral Daily  . gabapentin  400 mg Oral TID  . insulin aspart  0-9 Units Subcutaneous TID WC  . lidocaine-prilocaine   Topical Once  . pantoprazole  40 mg Oral BID    . rosuvastatin  20 mg Oral q1800  . sertraline  50 mg Oral Daily  . sodium chloride flush  3 mL Intravenous Q12H  . sucralfate  1 g Oral BID   Continuous Infusions: . sodium chloride 75 mL/hr at 09/03/19 0300  . ciprofloxacin 400 mg (09/03/19 1009)     LOS: 0 days    Time spent: 35 minutes    Barb Merino, MD [facility] Pager [phone]

## 2019-09-03 NOTE — Progress Notes (Signed)
09/03/19 1814  PT Visit Information  Last PT Received On 09/03/19  Assistance Needed +1  PT/OT/SLP Co-Evaluation/Treatment Yes  Reason for Co-Treatment To address functional/ADL transfers;For patient/therapist safety  PT goals addressed during session Mobility/safety with mobility;Balance  History of Present Illness 69 y.o. female with past medical history significant for DM2, HLD, Asthma who presented to the ED for progressive cough, dyspnea over the past week (1/02). admitted to [facility] w/ COVID (dx 12/31) and d/c home (at her and family insistence)1/4. Readmitted to hosital w/ falls and COVID PNA on 1/14, d/c home once more 1/20. And returned to ED 1/22 w/ c/o BLE swelling and SOB.  Precautions  Precautions Fall  Precaution Comments Pt reports she was falling every day  Restrictions  Weight Bearing Restrictions No  Home Living  Family/patient expects to be discharged to: [place] [facility]  Available Help at Discharge Other (Comment) (No one to assist at home. Has a fall alert system.)  Type of Home Apartment  Home Access Stairs to enter  Entrance Stairs-Number of Steps 1  Entrance Stairs-Rails Right  Home Layout One level  Bathroom Shower/Tub Tub/shower unit  Tax adviser - 4 wheels;Shower seat  Additional Comments Information from last admission. Pt planning to return to SNF for further rehab  Prior Function  Level of Independence Independent with assistive device(s) (Pt reports decline over the last month)  Comments Was performing ADLs prior to recent admission for COVID.   Communication  Communication No difficulties  Pain Assessment  Pain Assessment No/denies pain  Cognition  Arousal/Alertness Awake/alert  Behavior During Therapy WFL for tasks assessed/performed  Overall Cognitive Status Within Functional Limits for tasks assessed  General Comments Requiring additional cues and increased time  periodically. Following commands and agreeable to therapy  Upper Extremity Assessment  Upper Extremity Assessment Defer to OT evaluation  Lower Extremity Assessment  Lower Extremity Assessment Generalized weakness  Cervical / Trunk Assessment  Cervical / Trunk Assessment Other exceptions  Cervical / Trunk Exceptions Increased body habitus  Bed Mobility  Overal bed mobility Needs Assistance  Bed Mobility Supine to Sit;Sit to Supine  Supine to sit Supervision  Sit to supine Supervision  General bed mobility comments Supervision for safety  Transfers  Overall transfer level Needs assistance  Equipment used Rolling walker (2 wheeled)  Transfers Sit to/from Stand;Stand Pivot Transfers  Sit to Stand Min assist;+2 safety/equipment  General transfer comment Min A for power up into standing. +2 for safety. Cues for hand placement with RW. Pt with + orthostatics upon standing, therefore further mobility deferred.   Balance  Overall balance assessment Needs assistance  Sitting-balance support Feet supported  Sitting balance-Leahy Scale Good  Standing balance support During functional activity;Bilateral upper extremity supported  Standing balance-Leahy Scale Poor  Standing balance comment Reliant on UE support  General Comments  General comments (skin integrity, edema, etc.) SpO2 90s on 2L of O2.   PT - End of Session  Equipment Utilized During Treatment Gait belt  Activity Tolerance Patient tolerated treatment well  Patient left with call bell/phone within reach;in bed;with bed alarm set  Nurse Communication Mobility status;Other (comment) (+ orthostatics)  PT Assessment  PT Recommendation/Assessment Patient needs continued PT services  PT Visit Diagnosis Other abnormalities of gait and mobility (R26.89);Muscle weakness (generalized) (M62.81)  PT Problem List Decreased strength;Decreased activity tolerance;Decreased balance;Decreased mobility;Decreased range of motion;Decreased  coordination;Decreased knowledge of use of DME;Decreased safety awareness  Barriers to Discharge Decreased caregiver  support  PT Plan  PT Frequency (ACUTE ONLY) Min 2X/week  PT Treatment/Interventions (ACUTE ONLY) Gait training;Functional mobility training;Therapeutic activities;Therapeutic exercise;Balance training;Neuromuscular re-education;Patient/family education;DME instruction  AM-PAC PT "6 Clicks" Mobility Outcome Measure (Version 2)  Help needed turning from your back to your side while in a flat bed without using bedrails? 3  Help needed moving from lying on your back to sitting on the side of a flat bed without using bedrails? 3  Help needed moving to and from a bed to a chair (including a wheelchair)? 3  Help needed standing up from a chair using your arms (e.g., wheelchair or bedside chair)? 3  Help needed to walk in hospital room? 2  Help needed climbing 3-5 steps with a railing?  2  6 Click Score 16  Consider Recommendation of Discharge To: Home with [facility]  PT Recommendation  Follow Up Recommendations SNF;Supervision/Assistance - 24 hour  PT equipment None recommended by PT  Individuals Consulted  Consulted and Agree with Results and Recommendations Patient  Acute Rehab PT Goals  Patient Stated Goal "I want to get stronger so I can go home"  PT Goal Formulation With patient  Time For Goal Achievement 09/17/19  Potential to Achieve Goals Good  PT Time Calculation  PT Start Time (ACUTE ONLY) 1407  PT Stop Time (ACUTE ONLY) 1425  PT Time Calculation (min) (ACUTE ONLY) 18 min  PT General Charges  $$ ACUTE PT VISIT 1 Visit  PT Evaluation  $PT Eval Moderate Complexity 1 Mod  Written Expression  Dominant Hand Right   Pt admitted secondary to problem above with deficits above. Pt with positive orthostatics which limited mobility. Required min A to stand at EOB using RW. Pt reports she plans to return to SNF at d/c to finish therapy prior to return home. Will continue to follow  acutely to maximize functional mobility independence and safety.   Orthostatic BPs  Supine 78/58 (64)  Sitting at EOB 86/63 (72)  Standing 56/37 (44)   Supine after standing 106/96 (96)     Reuel Derby, PT, DPT  Acute Rehabilitation Services  Pager: [phone] Office: [phone]

## 2019-09-03 NOTE — Consult Note (Signed)
Referring Provider: Dr. Barb Merino Primary Care Physician:  Carollee Herter, Alferd Apa, DO Primary Gastroenterologist:  Dr. Wilford Corner  Reason for Consultation: nausea   HPI: [name] is a 69 y.o. female with past medical history of stage Ib gastric cancer s/p gastrectomy with Bilroth II 10 years ago,  DM type II, CAD s/p stent on Plavix, recent COVID-19 infection, DVT on Eliquis, and CKD stage III who presented with a 1-day history of nausea and midsternal chest pain.    A cardiac work-up with completed and unremarkable.    The patient reports a 3-week history of progressive dysphagia primarily to solids, as well as decreased appetite for one week, reporting an unusual taste that decreases her appetite.  She reports significant weight loss but documented weights do not corroborate that.  She further reports worsening of GERD and nausea, despite being on pantoprazole 40 mg daily.  She denies vomiting.  She denies abdominal pain and changes in bowel movements.   Her last EGD was performed in June 2017 and showed a normal esophagus, a 1 cm hiatal hernia, patent Billroth II gastrojejunostomy with healthy appearing mucosa, and normal examined jejunum.   Past Medical History:  Diagnosis Date  . Asthma   . Degenerative joint disease   . Diabetes mellitus   . Dyspnea   . GERD (gastroesophageal reflux disease)   . Hyperlipidemia    no longer on medication for this  . Hypertension    patient denies  . Myocardial infarction ([place]) 08/2018  . Neuropathy    bilateral hands and feet  . Sickle cell trait ([place])   . Sleep apnea   . stomach ca dx'd 2010   chemo/xrt comp 05/2009  . TIA (transient ischemic attack) 07/2015    Past Surgical History:  Procedure Laterality Date  . APPENDECTOMY    . CESAREAN SECTION    . COLON SURGERY     colonscopy  . COLONOSCOPY WITH PROPOFOL N/A 06/01/2016   Procedure: COLONOSCOPY WITH PROPOFOL;  Surgeon: Wilford Corner, MD;  Location: [facility];   Service: Endoscopy;  Laterality: N/A;  . CORONARY STENT INTERVENTION N/A 08/29/2018   Procedure: CORONARY STENT INTERVENTION;  Surgeon: Wellington Hampshire, MD;  Location: [facility];  Service: Cardiovascular;  Laterality: N/A;  . CORONARY/GRAFT ACUTE MI REVASCULARIZATION N/A 08/27/2018   Procedure: Coronary/Graft Acute MI Revascularization;  Surgeon: Leonie Man, MD;  Location: [facility];  Service: Cardiovascular;  Laterality: N/A;  . ESOPHAGOGASTRODUODENOSCOPY N/A 10/15/2012   Procedure: ESOPHAGOGASTRODUODENOSCOPY (EGD);  Surgeon: Lear Ng, MD;  Location: [facility];  Service: Endoscopy;  Laterality: N/A;  . ESOPHAGOGASTRODUODENOSCOPY N/A 01/15/2016   Procedure: ESOPHAGOGASTRODUODENOSCOPY (EGD);  Surgeon: Ronald Lobo, MD;  Location: [facility];  Service: Endoscopy;  Laterality: N/A;  . HERNIA REPAIR    . LEFT HEART CATH AND CORONARY ANGIOGRAPHY N/A 08/27/2018   Procedure: LEFT HEART CATH AND CORONARY ANGIOGRAPHY;  Surgeon: Leonie Man, MD;  Location: [facility];  Service: Cardiovascular;  Laterality: N/A;  . SHOULDER SURGERY     Left  . TOTAL HIP ARTHROPLASTY     Right  . TOTAL KNEE ARTHROPLASTY     Left    Prior to Admission medications   Medication Sig Start Date End Date Taking? Authorizing Provider  albuterol (PROVENTIL HFA;VENTOLIN HFA) 108 (90 Base) MCG/ACT inhaler Inhale 2 puffs into the lungs every 6 (six) hours as needed for wheezing or shortness of breath. 11/05/17  Yes Roxan Hockey, MD  albuterol (  PROVENTIL) (2.5 MG/3ML) 0.083% nebulizer solution Take 3 mLs (2.5 mg total) by nebulization every 6 (six) hours as needed for wheezing or shortness of breath. 11/05/17  Yes Emokpae, Courage, MD  apixaban (ELIQUIS) 5 MG TABS tablet Take 1 tablet (5 mg total) by mouth 2 (two) times daily. 08/19/19  Yes Bonnielee Haff, MD  clopidogrel (PLAVIX) 75 MG tablet Take 1 tablet (75 mg total) by mouth daily. 05/13/19  Yes Black, Lezlie Octave, NP   darifenacin (ENABLEX) 7.5 MG 24 hr tablet Take 7.5 mg by mouth daily. 07/23/19  Yes [provider]  feeding supplement, GLUCERNA SHAKE, (GLUCERNA SHAKE) LIQD Take 237 mLs by mouth 3 (three) times daily between meals. 08/14/19  Yes Swayze, Ava, DO  gabapentin (NEURONTIN) 400 MG capsule Take 1 capsule (400 mg total) by mouth 3 (three) times daily. 08/14/19  Yes Swayze, Ava, DO  ipratropium-albuterol (DUONEB) 0.5-2.5 (3) MG/3ML SOLN Take 3 mLs by nebulization every 6 (six) hours as needed. Patient taking differently: Take 3 mLs by nebulization every 6 (six) hours as needed (wheezing/shortness of breath.).  05/26/17  Yes Roma Schanz R, DO  lidocaine-prilocaine (EMLA) cream Apply 1 application topically as needed. Patient taking differently: Apply 1 application topically as needed (to numb port site.).  02/28/19  Yes Volanda Napoleon, MD  metFORMIN (GLUCOPHAGE) 500 MG tablet Take 1 tablet (500 mg total) by mouth 2 (two) times daily with a meal. 02/13/19  Yes Philemon Kingdom, MD  metoCLOPramide (REGLAN) 5 MG tablet Take 1 tablet (5 mg total) by mouth every 8 (eight) hours as needed for nausea (before meals as needed for nausea.). 08/23/19  Yes Arrien, Jimmy Picket, MD  metoprolol tartrate (LOPRESSOR) 25 MG tablet Take 0.5 tablets (12.5 mg total) by mouth 2 (two) times daily. 08/14/19  Yes Swayze, Ava, DO  Multiple Vitamins-Minerals (CENTRUM SILVER ADULT 50+) TABS Take 1 tablet once a day Patient taking differently: Take 1 tablet by mouth daily. Take 1 tablet once a day 02/20/19  Yes Lowne Lyndal Pulley R, DO  nitroGLYCERIN (NITROSTAT) 0.4 MG SL tablet Place 1 tablet (0.4 mg total) under the tongue every 5 (five) minutes as needed. Patient taking differently: Place 0.4 mg under the tongue every 5 (five) minutes as needed for chest pain.  01/14/19  Yes Lorretta Harp, MD  nystatin (MYCOSTATIN/NYSTOP) powder Apply topically 3 (three) times daily. 08/14/19  Yes Swayze, Ava, DO  pantoprazole  (PROTONIX) 40 MG tablet Take 1 tablet (40 mg total) by mouth daily. 07/16/19  Yes Roma Schanz R, DO  rosuvastatin (CRESTOR) 20 MG tablet Take 1 tablet (20 mg total) by mouth daily at 6 PM. 08/14/19  Yes Swayze, Ava, DO  sertraline (ZOLOFT) 50 MG tablet Take 1 tablet (50 mg total) by mouth daily. 02/20/19  Yes Roma Schanz R, DO  sucralfate (CARAFATE) 1 g tablet TAKE 1 TABLET BY MOUTH TWICE DAILY Patient taking differently: Take 1 g by mouth 2 (two) times daily.  02/05/19  Yes [place], Holli Humbles, NP  traMADol (ULTRAM) 50 MG tablet Take 2 tablets (100 mg total) by mouth 3 (three) times daily as needed for moderate pain. 08/20/19  Yes Bonnielee Haff, MD  traZODone (DESYREL) 50 MG tablet Take 0.5 tablets (25 mg total) by mouth at bedtime as needed for sleep. 08/14/19  Yes Swayze, Ava, DO  glucose blood (ONETOUCH VERIO) test strip Use as instructed to check blood sugar 3 times a day 09/04/18   Philemon Kingdom, MD    Current Facility-Administered  Medications  Medication Dose Route Frequency Provider Last Rate Last Admin  . 0.9 %  sodium chloride infusion   Intravenous Continuous Lang Snow, FNP 75 mL/hr at 09/03/19 0300 Rate Verify at 09/03/19 0300  . acetaminophen (TYLENOL) tablet 650 mg  650 mg Oral Q6H PRN Opyd, Ilene Qua, MD       Or  . acetaminophen (TYLENOL) suppository 650 mg  650 mg Rectal Q6H PRN Opyd, Ilene Qua, MD      . albuterol (PROVENTIL) (2.5 MG/3ML) 0.083% nebulizer solution 2.5 mg  2.5 mg Inhalation Q6H PRN Opyd, Ilene Qua, MD      . ciprofloxacin (CIPRO) IVPB 400 mg  400 mg Intravenous Q12H Barb Merino, MD 200 mL/hr at 09/03/19 1009 400 mg at 09/03/19 1009  . darifenacin (ENABLEX) 24 hr tablet 7.5 mg  7.5 mg Oral Daily Opyd, Ilene Qua, MD   7.5 mg at 09/03/19 0952  . gabapentin (NEURONTIN) capsule 400 mg  400 mg Oral TID Vianne Bulls, MD   400 mg at 09/03/19 0952  . insulin aspart (novoLOG) injection 0-9 Units  0-9 Units Subcutaneous TID WC Opyd, Ilene Qua, MD    2 Units at 09/03/19 1232  . ondansetron (ZOFRAN) tablet 4 mg  4 mg Oral Q6H PRN Opyd, Ilene Qua, MD   4 mg at 09/02/19 2139   Or  . ondansetron (ZOFRAN) injection 4 mg  4 mg Intravenous Q6H PRN Opyd, Ilene Qua, MD      . pantoprazole (PROTONIX) EC tablet 40 mg  40 mg Oral BID Barb Merino, MD   40 mg at 09/03/19 0952  . rosuvastatin (CRESTOR) tablet 20 mg  20 mg Oral q1800 Opyd, Ilene Qua, MD      . sertraline (ZOLOFT) tablet 50 mg  50 mg Oral Daily Opyd, Ilene Qua, MD   50 mg at 09/03/19 0952  . sodium chloride flush (NS) 0.9 % injection 3 mL  3 mL Intravenous Q12H Opyd, Ilene Qua, MD   3 mL at 09/03/19 0106  . sucralfate (CARAFATE) tablet 1 g  1 g Oral BID Opyd, Ilene Qua, MD   1 g at 09/03/19 0952  . traMADol (ULTRAM) tablet 100 mg  100 mg Oral TID PRN Vianne Bulls, MD   100 mg at 09/02/19 2139   Facility-Administered Medications Ordered in Other Encounters  Medication Dose Route Frequency Provider Last Rate Last Admin  . sodium chloride flush (NS) 0.9 % injection 10 mL  10 mL Intravenous PRN Eliezer Bottom, NP   10 mL at 09/13/16 1146    Allergies as of 09/02/2019 - Review Complete 09/02/2019  Allergen Reaction Noted  . Cephalexin Itching, Rash, and Other (See Comments) 08/14/2019  . Adhesive [tape] Other (See Comments) 01/02/2015  . Codeine Other (See Comments)   . Zocor [simvastatin - high dose] Other (See Comments) 09/07/2011  . Penicillins Rash 01/03/2008    Family History  Problem Relation Age of Onset  . Diabetes Brother   . Alzheimer's disease Father 30  . Cancer Mother 71  . Diabetes Maternal Grandmother   . Cancer Maternal Grandfather        lung  . Healthy Child   . Suicidality Neg Hx   . Depression Neg Hx   . Dementia Neg Hx   . Anxiety disorder Neg Hx     Social History   Socioeconomic History  . Marital status: Divorced    Spouse name: Not on file  . Number of children: 4  .  Years of education: Not on file  . Highest education level: High  school graduate  Occupational History  . Occupation: Retired Product/process development scientist: [employer]  . Smoking status: Former Smoker    Packs/day: 1.00    Years: 15.00    Pack years: 15.00    Types: Cigarettes    Start date: 09/27/1973    Quit date: 10/15/1988    Years since quitting: 30.9  . Smokeless tobacco: Never Used  . Tobacco comment: quit smoking 14 years ago  Substance and Sexual Activity  . Alcohol use: No    Alcohol/week: 0.0 standard drinks  . Drug use: No  . Sexual activity: Not on file  Other Topics Concern  . Not on file  Social History Narrative   Lives alone, 4 children live near her. She would like to move to an independent retirement community   Social Determinants of Radio broadcast assistant Strain: Low Risk   . Difficulty of Paying Living Expenses: Not very hard  Food Insecurity: No Food Insecurity  . Worried About Charity fundraiser in the Last Year: Never true  . Ran Out of Food in the Last Year: Never true  Transportation Needs: No Transportation Needs  . Lack of Transportation (Medical): No  . Lack of Transportation (Non-Medical): No  Physical Activity: Insufficiently Active  . Days of Exercise per Week: 3 days  . Minutes of Exercise per Session: 30 min  Stress: No Stress Concern Present  . Feeling of Stress : Not at all  Social Connections: Somewhat Isolated  . Frequency of Communication with Friends and Family: More than three times a week  . Frequency of Social Gatherings with Friends and Family: More than three times a week  . Attends Religious Services: More than 4 times per year  . Active Member of Clubs or Organizations: No  . Attends Archivist Meetings: Never  . Marital Status: Divorced  Human resources officer Violence: Not At Risk  . Fear of Current or Ex-Partner: No  . Emotionally Abused: No  . Physically Abused: No  . Sexually Abused: No    Review of Systems: See HPI  Physical Exam: Vital signs in last 24  hours: Temp:  [97.6 F (36.4 C)-98.4 F (36.9 C)] 98.3 F (36.8 C) (02/09 1133) Pulse Rate:  [53-106] 84 (02/09 1234) Resp:  [14-25] 17 (02/09 1234) BP: (67-119)/(41-66) 88/61 (02/09 1234) SpO2:  [63 %-100 %] 100 % (02/09 1234) Weight:  [89.3 kg] 89.3 kg (02/08 2316) Last BM Date: 09/01/19 General:  Alert, well-developed, significantly overweight, lying in bed in NAD Head: Normocephalic and atraumatic. Eyes: Sclera clear, no icterus. Lungs: Clear throughout to auscultation.   No evident respiratory distress. Heart:  Regular rate and rhythm; no murmurs, clicks, rubs,  or gallops. Abdomen: Soft, nontender, and nondistended but moderately adipose. No masses noted.  No guarding or rebound.  Bowel sounds present. Extremities:  No edema. Neurologic:  Alert and coherent;  grossly normal neurologically. Skin: Intact without significant lesions or rashes.  Skin is warm and dry and well perfused. Psych:  Alert and cooperative. Normal mood and affect.  Intake/Output from previous day: 02/08 0701 - 02/09 0700 In: 1910.8 [P.O.:240; I.V.:170.8; IV Piggyback:1500] Out: -  Intake/Output this shift: Total I/O In: 757.6 [I.V.:557.6; IV Piggyback:200] Out: 800 [Urine:800]  Lab Results: Recent Labs    09/02/19 1217 09/03/19 0009  WBC 8.5 9.6  HGB 10.3* 10.5*  HCT 29.7* 30.7*  PLT 322 332  BMET Recent Labs    09/02/19 1217 09/03/19 0009  NA 139 140  K 5.2* 4.3  CL 105 106  CO2 26 26  GLUCOSE 87 88  BUN 11 9  CREATININE 1.70* 1.58*  CALCIUM 8.5* 8.5*   LFT Recent Labs    09/03/19 0009  PROT 4.9*  ALBUMIN 1.7*  AST 41  ALT 30  ALKPHOS 123  BILITOT 0.7   PT/INR No results for input(s): LABPROT, INR in the last 72 hours.  Studies/Results: DG Chest Port 1 View  Result Date: 09/02/2019 CLINICAL DATA:  Chest pain. EXAM: PORTABLE CHEST 1 VIEW COMPARISON:  Chest x-ray dated August 16, 2019. FINDINGS: Unchanged right chest wall port catheter. The heart size and mediastinal  contours are within normal limits. Normal pulmonary vascularity. No focal consolidation, pleural effusion, or pneumothorax. No acute osseous abnormality. IMPRESSION: No active disease. Electronically Signed   By: Titus Dubin M.D.   On: 09/02/2019 12:28   ECHOCARDIOGRAM COMPLETE  Result Date: 09/03/2019    ECHOCARDIOGRAM REPORT   Patient Name:   MAELA TAKEDA Cuevas Date of Exam: 09/03/2019 Medical Rec #:  [id number]    Height:       66.0 in Accession #:    [id number]   Weight:       196.9 lb Date of Birth:  1951-03-26    BSA:          1.99 m Patient Age:    53 years     BP:           89/56 mmHg Patient Gender: F            HR:           76 bpm. Exam Location:  Inpatient Procedure: 2D Echo, Color Doppler, Cardiac Doppler and Intracardiac            Opacification Agent Indications:    R07.9* Chest pain, unspecified  History:        Patient has prior history of Echocardiogram examinations, most                 recent 08/28/2018. Risk Factors:Hypertension, Diabetes,                 Dyslipidemia and Sleep Apnea.  Sonographer:    Raquel Sarna Senior RDCS Referring Phys: [id number] [place]  1. Left ventricular ejection fraction, by estimation, is 65 to 70%. The left ventricle has normal function. The left ventrical has no regional wall motion abnormalities. Left ventricular diastolic parameters are consistent with Grade I diastolic dysfunction (impaired relaxation).  2. Right ventricular systolic function is normal. The right ventricular size is normal. There is mildly elevated pulmonary artery systolic pressure.  3. Mild mitral valve regurgitation.  4. The aortic valve is normal in structure and function. Aortic valve regurgitation is trivial . No aortic stenosis is present.  5. The inferior vena cava is normal in size with greater than 50% respiratory variability, suggesting right atrial pressure of 3 mmHg. FINDINGS  Left Ventricle: Left ventricular ejection fraction, by estimation, is 65 to 70%. The left ventricle  has normal function. The left ventricle has no regional wall motion abnormalities. The left ventricular internal cavity size was normal in size. There is  no left ventricular hypertrophy. Left ventricular diastolic parameters are consistent with Grade I diastolic dysfunction (impaired relaxation). Right Ventricle: The right ventricular size is normal. No increase in right ventricular wall thickness. Right ventricular systolic function is normal. There is mildly elevated pulmonary  artery systolic pressure. The tricuspid regurgitant velocity is 2.58  m/s, and with an assumed right atrial pressure of 8 mmHg, the estimated right ventricular systolic pressure is 40.7 mmHg. Left Atrium: Left atrial size was normal in size. Right Atrium: Right atrial size was normal in size. Pericardium: There is no evidence of pericardial effusion. Mitral Valve: The mitral valve is normal in structure and function. Normal mobility of the mitral valve leaflets. Mild mitral valve regurgitation. No evidence of mitral valve stenosis. Tricuspid Valve: The tricuspid valve is normal in structure. Tricuspid valve regurgitation is mild . No evidence of tricuspid stenosis. Aortic Valve: The aortic valve is normal in structure and function. Aortic valve regurgitation is trivial. No aortic stenosis is present. Pulmonic Valve: The pulmonic valve was normal in structure. Pulmonic valve regurgitation is not visualized. No evidence of pulmonic stenosis. Aorta: The aortic root is normal in size and structure. Venous: The inferior vena cava is normal in size with greater than 50% respiratory variability, suggesting right atrial pressure of 3 mmHg. The inferior vena cava and the hepatic vein show a normal flow pattern. IAS/Shunts: No atrial level shunt detected by color flow Doppler.  LEFT VENTRICLE PLAX 2D LVIDd:         4.52 cm  Diastology LVIDs:         2.66 cm  LV e' lateral:   8.27 cm/s LV PW:         1.12 cm  LV E/e' lateral: 7.0 LV IVS:        0.90  cm  LV e' medial:    5.55 cm/s LVOT diam:     1.90 cm  LV E/e' medial:  10.4 LV SV:         61.24 ml LV SV Index:   32.56 LVOT Area:     2.84 cm  RIGHT VENTRICLE RV S prime:     15.10 cm/s TAPSE (M-mode): 2.3 cm LEFT ATRIUM             Index       RIGHT ATRIUM           Index LA diam:        3.80 cm 1.91 cm/m  RA Area:     18.30 cm LA Vol (A2C):   42.8 ml 21.55 ml/m RA Volume:   45.90 ml  23.11 ml/m LA Vol (A4C):   55.4 ml 27.89 ml/m LA Biplane Vol: 52.1 ml 26.23 ml/m  AORTIC VALVE LVOT Vmax:   103.00 cm/s LVOT Vmean:  73.300 cm/s LVOT VTI:    0.216 m  AORTA Ao Root diam: 2.90 cm Ao Asc diam:  3.60 cm MITRAL VALVE                        TRICUSPID VALVE MV Area (PHT): 2.95 cm             TR Peak grad:   26.6 mmHg MV Decel Time: 257 msec             TR Vmax:        258.00 cm/s MV E velocity: 57.70 cm/s 103 cm/s MV A velocity: 80.50 cm/s 70.3 cm/s SHUNTS MV E/A ratio:  0.72       1.5       Systemic VTI:  0.22 m  Systemic Diam: 1.90 cm Fransico Him MD Electronically signed by Fransico Him MD Signature Date/Time: 09/03/2019/8:48:53 AM    Final     Impression: Concern for gastric cancer recurrence vs. GERD exacerbation versus infectious esophagitis  Plan: 1. Continue Protonix BID.  2.  Hold Plavix and Eliquis for now.  3.  NPO at midnight for EGD tomorrow.  Petra Kuba, purpose, risks reviewed.  With the patient on Plavix recently, we will not be able to do therapeutic intervention such as significant esophageal dilatation, but I do feel we can have an adequate diagnostic study.  In many cases, dysphagia will respond to medical therapy depending on its cause (for example, GERD induced erosive esophagitis, or infectious esophagitis).   LOS: 0 days   Youlanda Mighty Tajha Sammarco  09/03/2019, 3:56 PM   Pager [phone] If no answer or after 5 PM call [phone]

## 2019-09-03 NOTE — Progress Notes (Signed)
Echocardiogram 2D Echocardiogram has been performed.  Oneal Deputy Verlan Grotz 09/03/2019, 8:40 AM

## 2019-09-03 NOTE — Progress Notes (Signed)
Patient refused to have labs drawn. MD notified.

## 2019-09-04 ENCOUNTER — Inpatient Hospital Stay (HOSPITAL_COMMUNITY): Payer: Medicare HMO | Admitting: Certified Registered Nurse Anesthetist

## 2019-09-04 ENCOUNTER — Encounter (HOSPITAL_COMMUNITY)
Admission: EM | Disposition: A | Discharge status: Skilled Nursing Facility | Payer: Self-pay | Source: Home / Self Care | Attending: Internal Medicine

## 2019-09-04 ENCOUNTER — Encounter (HOSPITAL_COMMUNITY): Payer: Self-pay | Admitting: Internal Medicine

## 2019-09-04 DIAGNOSIS — N390 Urinary tract infection, site not specified: Secondary | ICD-10-CM | POA: Diagnosis present

## 2019-09-04 DIAGNOSIS — Z6833 Body mass index (BMI) 33.0-33.9, adult: Secondary | ICD-10-CM | POA: Diagnosis not present

## 2019-09-04 DIAGNOSIS — R630 Anorexia: Secondary | ICD-10-CM | POA: Diagnosis not present

## 2019-09-04 DIAGNOSIS — Z980 Intestinal bypass and anastomosis status: Secondary | ICD-10-CM | POA: Diagnosis not present

## 2019-09-04 DIAGNOSIS — Z1612 Extended spectrum beta lactamase (ESBL) resistance: Secondary | ICD-10-CM | POA: Diagnosis not present

## 2019-09-04 DIAGNOSIS — R1310 Dysphagia, unspecified: Secondary | ICD-10-CM | POA: Diagnosis not present

## 2019-09-04 DIAGNOSIS — Z8616 Personal history of covid-19: Secondary | ICD-10-CM | POA: Diagnosis not present

## 2019-09-04 DIAGNOSIS — Z85028 Personal history of other malignant neoplasm of stomach: Secondary | ICD-10-CM | POA: Diagnosis not present

## 2019-09-04 DIAGNOSIS — R0789 Other chest pain: Secondary | ICD-10-CM | POA: Diagnosis not present

## 2019-09-04 DIAGNOSIS — I9589 Other hypotension: Secondary | ICD-10-CM | POA: Diagnosis not present

## 2019-09-04 DIAGNOSIS — E1121 Type 2 diabetes mellitus with diabetic nephropathy: Secondary | ICD-10-CM | POA: Diagnosis not present

## 2019-09-04 DIAGNOSIS — E1122 Type 2 diabetes mellitus with diabetic chronic kidney disease: Secondary | ICD-10-CM | POA: Diagnosis not present

## 2019-09-04 DIAGNOSIS — R12 Heartburn: Secondary | ICD-10-CM | POA: Diagnosis not present

## 2019-09-04 DIAGNOSIS — I959 Hypotension, unspecified: Secondary | ICD-10-CM | POA: Diagnosis not present

## 2019-09-04 DIAGNOSIS — B9620 Unspecified Escherichia coli [E. coli] as the cause of diseases classified elsewhere: Secondary | ICD-10-CM | POA: Diagnosis not present

## 2019-09-04 DIAGNOSIS — A4150 Gram-negative sepsis, unspecified: Secondary | ICD-10-CM | POA: Diagnosis present

## 2019-09-04 DIAGNOSIS — N179 Acute kidney failure, unspecified: Secondary | ICD-10-CM | POA: Diagnosis not present

## 2019-09-04 DIAGNOSIS — N1831 Chronic kidney disease, stage 3a: Secondary | ICD-10-CM | POA: Diagnosis not present

## 2019-09-04 DIAGNOSIS — Z86718 Personal history of other venous thrombosis and embolism: Secondary | ICD-10-CM | POA: Diagnosis not present

## 2019-09-04 DIAGNOSIS — E861 Hypovolemia: Secondary | ICD-10-CM | POA: Diagnosis not present

## 2019-09-04 DIAGNOSIS — B9629 Other Escherichia coli [E. coli] as the cause of diseases classified elsewhere: Secondary | ICD-10-CM | POA: Diagnosis present

## 2019-09-04 HISTORY — PX: ESOPHAGOGASTRODUODENOSCOPY (EGD) WITH PROPOFOL: SHX5813

## 2019-09-04 LAB — GLUCOSE, CAPILLARY
Glucose-Capillary: 97 mg/dL (ref 70–99)
Glucose-Capillary: 85 mg/dL (ref 70–99)
Glucose-Capillary: 120 mg/dL — ABNORMAL HIGH (ref 70–99)

## 2019-09-04 LAB — CBC WITH DIFFERENTIAL/PLATELET
Abs Immature Granulocytes: 0.03 10*3/uL (ref 0.00–0.07)
Basophils Absolute: 0.1 10*3/uL (ref 0.0–0.1)
Basophils Relative: 1 %
Eosinophils Absolute: 0.3 10*3/uL (ref 0.0–0.5)
Eosinophils Relative: 3 %
HCT: 26.8 % — ABNORMAL LOW (ref 36.0–46.0)
Hemoglobin: 9.4 g/dL — ABNORMAL LOW (ref 12.0–15.0)
Immature Granulocytes: 0 %
Lymphocytes Relative: 33 %
Lymphs Abs: 2.8 10*3/uL (ref 0.7–4.0)
MCH: 29.7 pg (ref 26.0–34.0)
MCHC: 35.1 g/dL (ref 30.0–36.0)
MCV: 84.8 fL (ref 80.0–100.0)
Monocytes Absolute: 0.9 10*3/uL (ref 0.1–1.0)
Monocytes Relative: 11 %
Neutro Abs: 4.3 10*3/uL (ref 1.7–7.7)
Neutrophils Relative %: 52 %
Platelets: 252 10*3/uL (ref 150–400)
RBC: 3.16 MIL/uL — ABNORMAL LOW (ref 3.87–5.11)
RDW: 18.1 % — ABNORMAL HIGH (ref 11.5–15.5)
WBC: 8.3 10*3/uL (ref 4.0–10.5)
nRBC: 0 % (ref 0.0–0.2)

## 2019-09-04 LAB — BASIC METABOLIC PANEL
Anion gap: 10 (ref 5–15)
BUN: 10 mg/dL (ref 8–23)
CO2: 22 mmol/L (ref 22–32)
Calcium: 8 mg/dL — ABNORMAL LOW (ref 8.9–10.3)
Chloride: 111 mmol/L (ref 98–111)
Creatinine, Ser: 1.63 mg/dL — ABNORMAL HIGH (ref 0.44–1.00)
GFR calc Af Amer: 37 mL/min — ABNORMAL LOW (ref >60)
GFR calc non Af Amer: 32 mL/min — ABNORMAL LOW (ref >60)
Glucose, Bld: 124 mg/dL — ABNORMAL HIGH (ref 70–99)
Potassium: 4.6 mmol/L (ref 3.5–5.1)
Sodium: 143 mmol/L (ref 135–145)

## 2019-09-04 LAB — MAGNESIUM: Magnesium: 1.6 mg/dL — ABNORMAL LOW (ref 1.7–2.4)

## 2019-09-04 LAB — LACTIC ACID, PLASMA: Lactic Acid, Venous: 2.4 mmol/L (ref 0.5–1.9)

## 2019-09-04 LAB — URINE CULTURE: Culture: >=100000 — AB

## 2019-09-04 LAB — PHOSPHORUS: Phosphorus: 3.9 mg/dL (ref 2.5–4.6)

## 2019-09-04 SURGERY — ESOPHAGOGASTRODUODENOSCOPY (EGD) WITH PROPOFOL
Anesthesia: Monitor Anesthesia Care

## 2019-09-04 MED ORDER — PROPOFOL 500 MG/50ML IV EMUL
INTRAVENOUS | Status: DC | PRN
  Administered 2019-09-04: 100 ug/kg/min via INTRAVENOUS

## 2019-09-04 MED ORDER — PHENYLEPHRINE HCL (PRESSORS) 10 MG/ML IV SOLN
INTRAVENOUS | Status: DC | PRN
  Administered 2019-09-04 (×3): 80 ug via INTRAVENOUS

## 2019-09-04 MED ORDER — SODIUM CHLORIDE 0.9 % IV SOLN
1.0000 g | Freq: Two times a day (BID) | INTRAVENOUS | Status: DC
  Administered 2019-09-04 – 2019-09-05 (×4): 1 g via INTRAVENOUS
  Filled 2019-09-04 (×6): qty 1

## 2019-09-04 MED ORDER — LACTATED RINGERS IV SOLN: INTRAVENOUS | Status: DC

## 2019-09-04 MED ORDER — MAGNESIUM SULFATE 2 GM/50ML IV SOLN
2.0000 g | Freq: Once | INTRAVENOUS | Status: AC
  Administered 2019-09-04: 08:00:00 2 g via INTRAVENOUS
  Filled 2019-09-04: qty 50

## 2019-09-04 MED ORDER — LIDOCAINE HCL (CARDIAC) PF 100 MG/5ML IV SOSY
PREFILLED_SYRINGE | INTRAVENOUS | Status: DC | PRN
  Administered 2019-09-04: 60 mg via INTRATRACHEAL

## 2019-09-04 MED ORDER — SUCRALFATE 1 GM/10ML PO SUSP
1.0000 g | Freq: Three times a day (TID) | ORAL | Status: DC
  Administered 2019-09-04 – 2019-09-06 (×6): 1 g via ORAL
  Filled 2019-09-04 (×6): qty 10

## 2019-09-04 SURGICAL SUPPLY — 15 items

## 2019-09-04 NOTE — Anesthesia Preprocedure Evaluation (Signed)
Anesthesia Evaluation  Patient identified by MRN, date of birth, ID band Patient awake    Reviewed: Allergy & Precautions, NPO status , Patient's Chart, lab work & pertinent test results, reviewed documented beta blocker date and time   Airway Mallampati: II  TM Distance: >3 FB Neck ROM: Full    Dental  (+) Teeth Intact, Poor Dentition, Dental Advisory Given   Pulmonary shortness of breath and with exertion, asthma , sleep apnea , former smoker,  Former smoker quit 1990, 15 pack year history  covid positive 08/16/19  OSA- no CPAP   breath sounds clear to auscultation       Cardiovascular hypertension, Pt. on medications and Pt. on home beta blockers + angina + CAD, + Past MI, +CHF and + DVT   Rhythm:Regular Rate:Normal  Echo 09/03/19: 1. Left ventricular ejection fraction, by estimation, is 65 to 70%. The left ventricle has normal function. The left ventrical has no regional wall motion abnormalities. Left ventricular diastolic parameters are consistent with Grade I diastolic dysfunction (impaired relaxation).  2. Right ventricular systolic function is normal. The right ventricular size is normal. There is mildly elevated pulmonary artery systolic pressure.  3. Mild mitral valve regurgitation.  4. The aortic valve is normal in structure and function. Aortic valve regurgitation is trivial . No aortic stenosis is present.  5. The inferior vena cava is normal in size with greater than 50% respiratory variability, suggesting right atrial pressure of 3 mmHg.  Cath 08/29/18 s/p MI:  Previously placed Prox RCA to Mid RCA drug eluting stent is widely patent.  Balloon angioplasty was performed.  Prox RCA lesion is 20% stenosed.  Prox Cx to Mid Cx lesion is 90% stenosed.  Post intervention, there is a 0% residual stenosis.  A drug-eluting stent was successfully placed using a STENT RESOLUTE ONYX [id number]. Successful angioplasty and  drug-eluting stent placement to the mid left circumflex via the right radial artery.  right lower extremity DVT on Eliquis   Neuro/Psych PSYCHIATRIC DISORDERS Anxiety Depression TIA Neuromuscular disease    GI/Hepatic Neg liver ROS, GERD  Medicated and Controlled,Recurrent N/V/abd pain   Endo/Other  diabetes, Type 2, Oral Hypoglycemic AgentsObesity BMI 33  Renal/GU CRFRenal diseaseCr 1.63, CKD 3  negative genitourinary   Musculoskeletal  (+) Arthritis ,   Abdominal Normal abdominal exam  (+)   Peds  Hematology  (+) Blood dyscrasia, Sickle cell trait and anemia , hct 26.8   Anesthesia Other Findings Neuropathy B/L hands, feets   Reproductive/Obstetrics negative OB ROS                             Anesthesia Physical  Anesthesia Plan  ASA: IV  Anesthesia Plan: MAC   Post-op Pain Management:    Induction: Intravenous  PONV Risk Score and Plan: Propofol infusion and TIVA  Airway Management Planned: Natural Airway and Simple Face Mask  Additional Equipment: None  Intra-op Plan:   Post-operative Plan: Extubation in OR  Informed Consent: I have reviewed the patients History and Physical, chart, labs and discussed the procedure including the risks, benefits and alternatives for the proposed anesthesia with the patient or authorized representative who has indicated his/her understanding and acceptance.     Dental advisory given  Plan Discussed with: CRNA  Anesthesia Plan Comments:         Anesthesia Quick Evaluation

## 2019-09-04 NOTE — Progress Notes (Signed)
Patient's endoscopy was unrevealing other than significant amounts of retained food in the stomach, as is commonly seen in post gastrectomy patients.  No endoscopically evident cause was seen for the patient's heartburn, chest pain, or dysphagia symptoms although I suspect she may be having some bile reflux and associated esophageal dysmotility.  Fortunately, there was no evidence of recurrent gastric cancer.  Plan:  1.  Frequent small meals (have ordered bariatric diet) 2.  Sucralfate suspension for bile reflux on a trial basis 3.  Barium swallow tomorrow to check esophageal motility and to help exclude strictures, although there was no suggestion of a stricture during today's endoscopy.  Cleotis Nipper, M.D. Pager [phone] If no answer or after 5 PM call [phone]

## 2019-09-04 NOTE — Consult Note (Signed)
   [facility] Inpatient Consult   09/04/2019  JOCEE KISSICK 1950-09-07 [id number]   Patient is currently active with [facility] for chronic disease management services.  Patient has been engaged by a [place] NP.  Our community based plan of care has focused on disease management and community resource support.    Patient will receive a post hospital call and will be evaluated for assessments and disease process education.    Spoke with patient via hospital phone on 09/03/2019 at 3 pm HIPAA confirmed, and she states she was doing alright but tired and did not feel up to talking very much.  She consents to ongoing follow up with [facility] for care and disease management.  Follow up with  Inpatient Transition Of Care [TOC] team member to make aware that [facility] following.   Of note, [facility] Care Management services does not replace or interfere with any services that are needed or arranged by inpatient [facility] care management team.  For additional questions or referrals please contact:  [name], RN BSN [facility] Liaison  [phone] business mobile phone Toll free office [phone]  Fax number: [phone] [email or web address]

## 2019-09-04 NOTE — TOC Initial Note (Signed)
Transition of Care [facility]) - Initial/Assessment Note    Patient Details  Name: [name] MRN: [id number] Date of Birth: 1951-03-20  Transition [facility] CM/SW Contact:    Arvella Merles, LCSW Phone Number: [phone] 09/04/2019, 11:12 AM  Clinical Narrative:                 CSW received consult for possible SNF placement at time of discharge. CSW spoke with patient regarding PT recommendation of SNF placement at time of discharge. Patient expressed understanding of PT recommendation and is agreeable to SNF placement at time of discharge. Patient reports she came from [facility] and does not want to go back there. Patient stated she would like to stay in the [place] area. CSW discussed insurance authorization process and provided Medicare SNF ratings list. Patient provided verbal consent for CSW to contact her daugher Alanna [phone]. No further questions reported at this time. CSW to continue to follow and assist with discharge planning needs.   Expected Discharge Plan: Skilled Nursing Facility Barriers to Discharge: Continued Medical Work up   Patient Goals and CMS Choice Patient states their goals for this hospitalization and ongoing recovery are:: return to rehab then home CMS Medicare.gov Compare Post Acute Care list provided to:: Patient Choice offered to / list presented to : Patient  Expected Discharge Plan and Services Expected Discharge Plan: [place]                                              Prior Living Arrangements/Services     Patient language and need for interpreter reviewed:: Yes        Need for Family Participation in Patient Care: No (Comment) Care giver support system in place?: Yes (comment)   Criminal Activity/Legal Involvement Pertinent to Current Situation/Hospitalization: No - Comment as needed  Activities of Daily Living      Permission Sought/Granted Permission sought to share information with :  Family Supports, Chartered certified accountant granted to share information with : Yes, Verbal Permission Granted  Share Information with NAME: Alanna  Permission granted to share info w AGENCY: SNFs  Permission granted to share info w Relationship: Daughter  Permission granted to share info w Contact Information: [phone]  Emotional Assessment Appearance:: Appears stated age Attitude/Demeanor/Rapport: Engaged Affect (typically observed): Calm Orientation: : Oriented to Self, Oriented to Place, Oriented to Situation, Oriented to  Time Alcohol / Substance Use: Not Applicable Psych Involvement: No (comment)  Admission diagnosis:  Hyperkalemia [E87.5] AKI (acute kidney injury) ([place]) [N17.9] Hypotension [I95.9] Hypotension, unspecified hypotension type [I95.9] Urinary tract infection without hematuria, site unspecified [N39.0] Patient Active Problem List   Diagnosis Date Noted  . Sepsis due to gram-negative UTI ([place]) 09/04/2019  . Urinary tract infection due to extended-spectrum beta lactamase (ESBL) producing Escherichia coli 09/04/2019  . Acute lower UTI 09/02/2019  . History of DVT (deep vein thrombosis) 09/02/2019  . Drug-induced polyneuropathy ([place]) 08/18/2019  . FTT (failure to thrive) in adult 08/17/2019  . SOB (shortness of breath) 08/16/2019  . Lower extremity edema 08/16/2019  . Fall 08/08/2019  . Hypertension 08/08/2019  . COVID-19 virus infection 07/25/2019  . Elevated LFTs 07/25/2019  . COVID-19 07/25/2019  . Allergic contact dermatitis due to adhesives 05/23/2019  . Insomnia 05/23/2019  . SIRS (systemic inflammatory response syndrome) ([place]) 05/14/2019  . NSTEMI (non-ST elevated myocardial infarction) (  [place]) 05/14/2019  . Hyperglycemia   . Neutropenia ([place])   . CAD (coronary artery disease)   . Obesity with serious comorbidity 04/16/2019  . Gastroenteritis 03/27/2019  . Falls frequently 03/27/2019  . Acute gastroenteritis 03/22/2019  . Hematoma  of scalp 03/14/2019  . Vitamin D deficiency 11/09/2018  . Muscle spasm 10/19/2018  . Chest pain 09/07/2018  . Palpitations 09/07/2018  . Other fatigue 09/07/2018  . Acute ST elevation myocardial infarction (STEMI) of inferior wall ([place]) 08/27/2018    Class: Hospitalized for  . Atherosclerotic heart disease of native coronary artery with other forms of angina pectoris ([place]) 08/27/2018    Class: Diagnosis of  . Acute vaginitis 08/20/2018  . Hyperlipidemia associated with type 2 diabetes mellitus ([place]) 04/09/2018  . Bronchitis 11/02/2017  . Sepsis ([place]) 11/02/2017  . Leucocytosis 11/02/2017  . Moderate persistent asthma with acute exacerbation 05/26/2017  . Influenza A 05/26/2017  . Acute pain of right shoulder 03/07/2017  . Acute bilateral low back pain without sciatica 03/07/2017  . Chronic pain 03/07/2017  . Fatty liver 03/24/2016  . Weakness 03/23/2016  . Unexplained weight loss 03/23/2016  . Melena 03/23/2016  . Hypotension 02/03/2016  . Syncope 02/03/2016  . Hypomagnesemia 02/03/2016  . Acute renal failure superimposed on stage 3 chronic kidney disease ([place]) 02/03/2016  . Gastrointestinal dysmotility 01/20/2016  . CKD (chronic kidney disease) stage 3, GFR 30-59 ml/min (HCC) 01/20/2016  . Abscess of right thigh 01/20/2016  . Chronic pain syndrome 01/15/2016  . Diarrhea 01/15/2016  . Generalized abdominal pain 01/14/2016  . Type 2 diabetes with nephropathy ([place]) 12/30/2015  . TIA (transient ischemic attack) 07/2015  . Morbid obesity due to excess calories ([place]) 05/05/2015  . Diabetic polyneuropathy associated with type 2 diabetes mellitus ([place]) 01/02/2015  . Acute bacterial sinusitis 01/02/2015  . Onychomycosis 11/04/2014  . Pain in lower limb 11/04/2014  . Multinodular goiter (nontoxic) 10/16/2014  . Iron deficiency anemia 05/20/2014  . Major depressive disorder, single episode, moderate ([place]) 05/20/2014  . Dizziness of unknown cause 04/28/2014  . MDD (major depressive  disorder), recurrent episode, moderate ([place]) 01/28/2014  . GERD (gastroesophageal reflux disease) 01/02/2014  . Nausea 10/15/2012  . Stomach cancer ([place]) 12/10/2008  . CHRONIC RESPIRATORY FAILURE 01/03/2008  . Hyperlipidemia 06/27/2007  . OBESITY, MORBID 06/27/2007  . SLEEP APNEA, OBSTRUCTIVE 06/27/2007  . Generalized idiopathic epilepsy and epileptic syndromes, not intractable, with status epilepticus ([place]) 06/27/2007  . Asthma 06/27/2007  . OSTEOARTHRITIS  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 06/27/2007   PCP:  Ann Held, DO Pharmacy:   [id number] [place] [address] [id number]: [phone]: [phone] [address] [place]Phone: [phone]: [phone]    Social Determinants of Health (SDOH) Interventions    Readmission Risk Interventions Readmission Risk Prevention Plan 05/16/2019  Transportation Screening Complete  Medication Review (RBryan Complete  HRI or HNicholsComplete  SW Recovery Care/Counseling Consult Complete  PRossNot Applicable  Some recent data might be hidden

## 2019-09-04 NOTE — Anesthesia Postprocedure Evaluation (Signed)
Anesthesia Post Note  Patient: [name]  Procedure(s) Performed: ESOPHAGOGASTRODUODENOSCOPY (EGD) WITH PROPOFOL (N/A )     Patient location during evaluation: PACU Anesthesia Type: MAC Level of consciousness: awake and alert Pain management: pain level controlled Vital Signs Assessment: post-procedure vital signs reviewed and stable Respiratory status: spontaneous breathing, nonlabored ventilation and respiratory function stable Cardiovascular status: blood pressure returned to baseline and stable Postop Assessment: no apparent nausea or vomiting Anesthetic complications: no    Last Vitals:  Vitals:   09/04/19 1310 09/04/19 1450  BP: (!) 94/54 (!) 105/59  Pulse: 88 93  Resp: 15   Temp:  37 C  SpO2: 100%     Last Pain:  Vitals:   09/04/19 1450  TempSrc: Oral  PainSc:                  Pervis Hocking

## 2019-09-04 NOTE — Progress Notes (Signed)
PROGRESS NOTE    [name]  RWE:[id number] DOB: Oct 19, 1950 DOA: 09/02/2019 PCP: Ann Held, DO    Brief Narrative:  69 year old with history of coronary artery disease status post RCA stent 12 months ago on Plavix, recent COVID-19 infection, right lower extremity DVT on Eliquis, multiple recent hospitalizations mostly related to poor appetite, epigastric pain and dehydration, type 2 diabetes, chronic kidney disease stage III, asthma, chronic pain syndrome presented from skilled nursing facility with complaints of few days of nausea without vomiting and midsternal chest pain that began one day PTA. With history of coronary artery disease, she was treated with nitroglycerin and aspirin prior to arrival with chest pain improved.  In the emergency room she was afebrile, she was slightly tachycardic.  Blood pressures well 67/41.  EKG was sinus rhythm.  Chest x-ray normal.  Potassium 5.2, albumin 1.7.  Creatinine 1.7 with baseline creatinine about 1.3 last month.  Troponins were nonischemic.  Lactic acid was slightly elevated.  Urinalysis was abnormal. Treated with IV fluids and admitted to the hospital.   Assessment & Plan:   Principal Problem:   Hypotension Active Problems:   Asthma   Type 2 diabetes with nephropathy (HCC)   Acute renal failure superimposed on stage 3 chronic kidney disease (HCC)   Chronic pain   Chest pain   CAD (coronary artery disease)   FTT (failure to thrive) in adult   History of DVT (deep vein thrombosis)  Hypotension: With sepsis present on admission due to ESBL UTI.  Sepsis resolved.  Also suspected hypovolemia.  UTI present on admission due to ESBL.  Treated with ciprofloxacin.  Resistant.   We will treat with meropenem for 5 days.  Mild to moderate allergy to penicillin and cephalosporin.   We will start treatment in supervised setting and monitor.    Recurrent nausea, vomiting, poor appetite with history of stage Ib gastric cancer status post  resection 10 years ago: Recurrent admission and dysphagia.   Eliquis and Plavix on hold, last dose 05/02/2020 morning.   On Protonix 40 mg twice daily.   Seen by gastroenterology, scheduled for upper GI endoscopy today.   Recent CAT scans were negative for any evidence of gastric abnormalities.    Atypical chest pain: Serial troponins and EKG nonischemic.  2D echocardiogram shows no evidence of abnormal wall motion.  Suspect GI symptoms.  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  Hold Metformin.  Keep on sliding scale insulin while in the hospital.  Chronic pain syndrome: Patient is already on tramadol that she will continue.  Acute kidney injury with history of underlying chronic kidney disease stage IIIa: Patient was treated with IV fluids.  Baseline creatinine 1.3.  Continues to improve.  Continue maintenance IV fluids.  Hypomagnesemia: Replace and monitor levels.  DVT prophylaxis: Eliquis, last dose 09/03/2019, holding Code Status: Full code Family Communication: Patient's daughter, 2/9 .  Will notify after procedure findings. Disposition Plan: patient is from short-term skilled rehab. Anticipated DC to short-term skilled rehab, Barriers to discharge active symptoms, plan for endoscopy.  IV antibiotics needed for ESBL treatment.   Consultants:   Gastroenterology  Procedures:   None  Antimicrobials:  Anti-infectives (From admission, onward)   Start     Dose/Rate Route Frequency Ordered Stop   09/04/19 1000  meropenem (MERREM) 1 g in sodium chloride 0.9 % 100 mL IVPB     1 g 200 mL/hr over 30 Minutes Intravenous Every 12 hours 09/04/19 0908     09/03/19  0930  ciprofloxacin (CIPRO) IVPB 400 mg  Status:  Discontinued     400 mg 200 mL/hr over 60 Minutes Intravenous Every 12 hours 09/03/19 0812 09/04/19 0906   09/02/19 2015  ceFEPIme (MAXIPIME) 2 g in sodium chloride 0.9 % 100 mL IVPB  Status:  Discontinued     2 g 200 mL/hr over 30 Minutes Intravenous Every 12 hours  09/02/19 2003 09/02/19 2034   09/02/19 2000  aztreonam (AZACTAM) 2 g in sodium chloride 0.9 % 100 mL IVPB  Status:  Discontinued     2 g 200 mL/hr over 30 Minutes Intravenous  Once 09/02/19 1948 09/02/19 2003   09/02/19 1900  ciprofloxacin (CIPRO) IVPB 400 mg  Status:  Discontinued     400 mg 200 mL/hr over 60 Minutes Intravenous  Once 09/02/19 1850 09/02/19 2039         Subjective: Patient seen and examined.  No overnight events.  Automatic blood pressure monitor shows low blood pressures, however her pressures are more than 90.  Denies any dizziness.  No chest pain. Today her main complaint is unable to swallow and midsternal discomfort.  Objective: Vitals:   09/03/19 2100 09/04/19 0000 09/04/19 0514 09/04/19 0658  BP: (!) 91/55 (!) 94/56 (!) 85/56 92/60  Pulse: 92 87  85  Resp: 16 19  19   Temp: 98.9 F (37.2 C)  98.8 F (37.1 C)   TempSrc: Oral  Oral   SpO2: 100%   100%  Weight:   93.1 kg   Height:        Intake/Output Summary (Last 24 hours) at 09/04/2019 0918 Last data filed at 09/04/2019 0448 Gross per 24 hour  Intake 1229.58 ml  Output 1200 ml  Net 29.58 ml   Filed Weights   09/02/19 1204 09/02/19 2316 09/04/19 0514  Weight: 87.5 kg 89.3 kg 93.1 kg    Examination:  General exam: Appears calm and comfortable , patient is on room air.  Slightly anxious today. Respiratory system: Clear to auscultation. Respiratory effort normal.  No added sounds.  She does have chest wall right-sided Port-A-Cath that is nontender. Cardiovascular system: S1 & S2 heard, RRR. No JVD, murmurs, rubs, gallops or clicks.  Gastrointestinal system: Abdomen is nondistended, soft and no definite tenderness. No organomegaly or masses felt. Normal bowel sounds heard. Central nervous system: Alert and oriented. No focal neurological deficits. Extremities: Symmetric 5 x 5 power. Skin: No rashes, lesions or ulcers Psychiatry: Judgement and insight appear normal. Mood & affect appropriate.      Data Reviewed: I have personally reviewed following labs and imaging studies  CBC: Recent Labs  Lab 09/02/19 1217 09/03/19 0009 09/04/19 0405  WBC 8.5 9.6 8.3  NEUTROABS 4.9 5.6 4.3  HGB 10.3* 10.5* 9.4*  HCT 29.7* 30.7* 26.8*  MCV 84.9 86.0 84.8  PLT 322 332 045   Basic Metabolic Panel: Recent Labs  Lab 09/02/19 1217 09/03/19 0009 09/04/19 0405  NA 139 140 143  K 5.2* 4.3 4.6  CL 105 106 111  CO2 26 26 22   GLUCOSE 87 88 124*  BUN 11 9 10   CREATININE 1.70* 1.58* 1.63*  CALCIUM 8.5* 8.5* 8.0*  MG  --   --  1.6*  PHOS  --   --  3.9   GFR: Estimated Creatinine Clearance: 38.5 mL/min (A) (by C-G formula based on SCr of 1.63 mg/dL (H)). Liver Function Tests: Recent Labs  Lab 09/02/19 1217 09/03/19 0009  AST 47* 41  ALT 31 30  ALKPHOS 118  123  BILITOT 0.8 0.7  PROT 4.6* 4.9*  ALBUMIN 1.7* 1.7*   Recent Labs  Lab 09/02/19 1217  LIPASE 11   No results for input(s): AMMONIA in the last 168 hours. Coagulation Profile: No results for input(s): INR, PROTIME in the last 168 hours. Cardiac Enzymes: No results for input(s): CKTOTAL, CKMB, CKMBINDEX, TROPONINI in the last 168 hours. BNP (last 3 results) No results for input(s): PROBNP in the last 8760 hours. HbA1C: No results for input(s): HGBA1C in the last 72 hours. CBG: Recent Labs  Lab 09/03/19 0742 09/03/19 1153 09/03/19 1706 09/03/19 2207 09/04/19 0731  GLUCAP 74 157* 75 120* 97   Lipid Profile: No results for input(s): CHOL, HDL, LDLCALC, TRIG, CHOLHDL, LDLDIRECT in the last 72 hours. Thyroid Function Tests: No results for input(s): TSH, T4TOTAL, FREET4, T3FREE, THYROIDAB in the last 72 hours. Anemia Panel: No results for input(s): VITAMINB12, FOLATE, FERRITIN, TIBC, IRON, RETICCTPCT in the last 72 hours. Sepsis Labs: Recent Labs  Lab 09/03/19 0009 09/03/19 1054  LATICACIDVEN 2.8* 2.1*    Recent Results (from the past 240 hour(s))  Urine culture     Status: Abnormal   Collection  Time: 09/02/19  5:07 PM   Specimen: Urine, Clean Catch  Result Value Ref Range Status   Specimen Description URINE, CLEAN CATCH  Final   Special Requests   Final    NONE Performed at [facility], [place] [address], [place], [place] [address]    Culture (A)  Final    >=100,000 COLONIES/mL ESCHERICHIA COLI Confirmed Extended Spectrum Beta-Lactamase Producer (ESBL).  In bloodstream infections from ESBL organisms, carbapenems are preferred over piperacillin/tazobactam. They are shown to have a lower risk of mortality.    Report Status 09/04/2019 FINAL  Final   Organism ID, Bacteria ESCHERICHIA COLI (A)  Final      Susceptibility   Escherichia coli - MIC*    AMPICILLIN >=32 RESISTANT Resistant     CEFAZOLIN >=64 RESISTANT Resistant     CEFTRIAXONE >=64 RESISTANT Resistant     CIPROFLOXACIN >=4 RESISTANT Resistant     GENTAMICIN <=1 SENSITIVE Sensitive     IMIPENEM <=0.25 SENSITIVE Sensitive     NITROFURANTOIN <=16 SENSITIVE Sensitive     TRIMETH/SULFA >=320 RESISTANT Resistant     AMPICILLIN/SULBACTAM 8 SENSITIVE Sensitive     PIP/TAZO <=4 SENSITIVE Sensitive     * >=100,000 COLONIES/mL ESCHERICHIA COLI         Radiology Studies: DG Chest Port 1 View  Result Date: 09/02/2019 CLINICAL DATA:  Chest pain. EXAM: PORTABLE CHEST 1 VIEW COMPARISON:  Chest x-ray dated August 16, 2019. FINDINGS: Unchanged right chest wall port catheter. The heart size and mediastinal contours are within normal limits. Normal pulmonary vascularity. No focal consolidation, pleural effusion, or pneumothorax. No acute osseous abnormality. IMPRESSION: No active disease. Electronically Signed   By: Titus Dubin M.D.   On: 09/02/2019 12:28   ECHOCARDIOGRAM COMPLETE  Result Date: 09/03/2019    ECHOCARDIOGRAM REPORT   Patient Name:   [name] Date of Exam: 09/03/2019 Medical Rec #:  [id number]    Height:       66.0 in Accession #:    [id number]   Weight:       196.9 lb Date of Birth:  [birth year]    BSA:           1.99 m Patient Age:    24 years     BP:  89/56 mmHg Patient Gender: F            HR:           76 bpm. Exam Location:  Inpatient Procedure: 2D Echo, Color Doppler, Cardiac Doppler and Intracardiac            Opacification Agent Indications:    R07.9* Chest pain, unspecified  History:        Patient has prior history of Echocardiogram examinations, most                 recent 08/28/2018. Risk Factors:Hypertension, Diabetes,                 Dyslipidemia and Sleep Apnea.  Sonographer:    Raquel Sarna Senior RDCS Referring Phys: [id number] [place]  1. Left ventricular ejection fraction, by estimation, is 65 to 70%. The left ventricle has normal function. The left ventrical has no regional wall motion abnormalities. Left ventricular diastolic parameters are consistent with Grade I diastolic dysfunction (impaired relaxation).  2. Right ventricular systolic function is normal. The right ventricular size is normal. There is mildly elevated pulmonary artery systolic pressure.  3. Mild mitral valve regurgitation.  4. The aortic valve is normal in structure and function. Aortic valve regurgitation is trivial . No aortic stenosis is present.  5. The inferior vena cava is normal in size with greater than 50% respiratory variability, suggesting right atrial pressure of 3 mmHg. FINDINGS  Left Ventricle: Left ventricular ejection fraction, by estimation, is 65 to 70%. The left ventricle has normal function. The left ventricle has no regional wall motion abnormalities. The left ventricular internal cavity size was normal in size. There is  no left ventricular hypertrophy. Left ventricular diastolic parameters are consistent with Grade I diastolic dysfunction (impaired relaxation). Right Ventricle: The right ventricular size is normal. No increase in right ventricular wall thickness. Right ventricular systolic function is normal. There is mildly elevated pulmonary artery systolic pressure. The tricuspid regurgitant  velocity is 2.58  m/s, and with an assumed right atrial pressure of 8 mmHg, the estimated right ventricular systolic pressure is 44.3 mmHg. Left Atrium: Left atrial size was normal in size. Right Atrium: Right atrial size was normal in size. Pericardium: There is no evidence of pericardial effusion. Mitral Valve: The mitral valve is normal in structure and function. Normal mobility of the mitral valve leaflets. Mild mitral valve regurgitation. No evidence of mitral valve stenosis. Tricuspid Valve: The tricuspid valve is normal in structure. Tricuspid valve regurgitation is mild . No evidence of tricuspid stenosis. Aortic Valve: The aortic valve is normal in structure and function. Aortic valve regurgitation is trivial. No aortic stenosis is present. Pulmonic Valve: The pulmonic valve was normal in structure. Pulmonic valve regurgitation is not visualized. No evidence of pulmonic stenosis. Aorta: The aortic root is normal in size and structure. Venous: The inferior vena cava is normal in size with greater than 50% respiratory variability, suggesting right atrial pressure of 3 mmHg. The inferior vena cava and the hepatic vein show a normal flow pattern. IAS/Shunts: No atrial level shunt detected by color flow Doppler.  LEFT VENTRICLE PLAX 2D LVIDd:         4.52 cm  Diastology LVIDs:         2.66 cm  LV e' lateral:   8.27 cm/s LV PW:         1.12 cm  LV E/e' lateral: 7.0 LV IVS:        0.90 cm  LV e' medial:    5.55 cm/s LVOT diam:     1.90 cm  LV E/e' medial:  10.4 LV SV:         61.24 ml LV SV Index:   32.56 LVOT Area:     2.84 cm  RIGHT VENTRICLE RV S prime:     15.10 cm/s TAPSE (M-mode): 2.3 cm LEFT ATRIUM             Index       RIGHT ATRIUM           Index LA diam:        3.80 cm 1.91 cm/m  RA Area:     18.30 cm LA Vol (A2C):   42.8 ml 21.55 ml/m RA Volume:   45.90 ml  23.11 ml/m LA Vol (A4C):   55.4 ml 27.89 ml/m LA Biplane Vol: 52.1 ml 26.23 ml/m  AORTIC VALVE LVOT Vmax:   103.00 cm/s LVOT Vmean:  73.300  cm/s LVOT VTI:    0.216 m  AORTA Ao Root diam: 2.90 cm Ao Asc diam:  3.60 cm MITRAL VALVE                        TRICUSPID VALVE MV Area (PHT): 2.95 cm             TR Peak grad:   26.6 mmHg MV Decel Time: 257 msec             TR Vmax:        258.00 cm/s MV E velocity: 57.70 cm/s 103 cm/s MV A velocity: 80.50 cm/s 70.3 cm/s SHUNTS MV E/A ratio:  0.72       1.5       Systemic VTI:  0.22 m                                     Systemic Diam: 1.90 cm Fransico Him MD Electronically signed by Fransico Him MD Signature Date/Time: 09/03/2019/8:48:53 AM    Final         Scheduled Meds: . darifenacin  7.5 mg Oral Daily  . gabapentin  400 mg Oral TID  . insulin aspart  0-9 Units Subcutaneous TID WC  . pantoprazole  40 mg Oral BID  . rosuvastatin  20 mg Oral q1800  . sertraline  50 mg Oral Daily  . sodium chloride flush  3 mL Intravenous Q12H  . sucralfate  1 g Oral BID   Continuous Infusions: . sodium chloride 75 mL/hr at 09/04/19 0706  . sodium chloride    . meropenem (MERREM) IV       LOS: 1 day    Time spent: 30 minutes    Barb Merino, MD [facility] Pager [phone]

## 2019-09-04 NOTE — Consult Note (Addendum)
WOC Nurse Consult Note: Reason for Consult: Consult requested for buttocks and groin Wound type: Pt has darker colored dry peeling skin which is evolving to pink dry partial thickness skin loss on buttocks/sacrum; appearance is consistent with moisture associated skin damage which is resolved and healing, NOT a pressure injury.  Pt has previously been incontinent of urine and is is currently contained with a Purewick device.  Inner groin and perineum is red moist and macerated; appearance is consistent with moisture associated skin damage from previous incontinence. Dressing procedure/placement/frequency: Topical treatment orders provided for the bedside nurses to perform to protect skin and repel moisture: Apply barrier cream to buttocks/groin/upper thighs with each turning and cleaning episode. Please re-consult if further assistance is needed.  Thank-you,  Julien Girt MSN, [place], [place], [place], [place]

## 2019-09-04 NOTE — Anesthesia Procedure Notes (Signed)
Procedure Name: MAC Date/Time: 09/04/2019 12:07 PM Performed by: Kathryne Hitch, CRNA Pre-anesthesia Checklist: Patient identified, Emergency Drugs available, Suction available and Patient being monitored Patient Re-evaluated:Patient Re-evaluated prior to induction Oxygen Delivery Method: Nasal cannula Induction Type: IV induction Dental Injury: Teeth and Oropharynx as per pre-operative assessment

## 2019-09-04 NOTE — Transfer of Care (Signed)
Immediate Anesthesia Transfer of Care Note  Patient: [name]  Procedure(s) Performed: ESOPHAGOGASTRODUODENOSCOPY (EGD) WITH PROPOFOL (N/A )  Patient Location: PACU and Endoscopy Unit  Anesthesia Type:MAC  Level of Consciousness: drowsy  Airway & Oxygen Therapy: Patient Spontanous Breathing and Patient connected to nasal cannula oxygen  Post-op Assessment: Report given to RN, Post -op Vital signs reviewed and stable and Patient moving all extremities  Post vital signs: Reviewed and stable  Last Vitals:  Vitals Value Taken Time  BP 91/43 09/04/19 1238  Temp    Pulse 101 09/04/19 1239  Resp 17 09/04/19 1239  SpO2 100 % 09/04/19 1239  Vitals shown include unvalidated device data.  Last Pain:  Vitals:   09/04/19 1238  TempSrc:   PainSc: 0-No pain      Patients Stated Pain Goal: 0 (19/41/74 0814)  Complications: No apparent anesthesia complications

## 2019-09-04 NOTE — NC FL2 (Signed)
[place] MEDICAID FL2 LEVEL OF CARE SCREENING TOOL     IDENTIFICATION  Patient Name: [name] Birthdate: 24-Nov-1950 Sex: female Admission Date (Current Location): 09/02/2019  [facility] and [place] Number:  Herbalist and Address:  [facility]. [facility], [place] [address] [place], [place] [address]      Provider Number: [id number]  Attending Physician Name and Address:  Barb Merino, MD  Relative Name and Phone Number:  [name] [phone]    Current Level of Care: SNF Recommended Level of Care: [place] Prior Approval Number:    Date Approved/Denied:   PASRR Number: [id number]  Discharge Plan: SNF    Current Diagnoses: Patient Active Problem List   Diagnosis Date Noted  . Sepsis due to gram-negative UTI ([place]) 09/04/2019  . Urinary tract infection due to extended-spectrum beta lactamase (ESBL) producing Escherichia coli 09/04/2019  . Acute lower UTI 09/02/2019  . History of DVT (deep vein thrombosis) 09/02/2019  . Drug-induced polyneuropathy ([place]) 08/18/2019  . FTT (failure to thrive) in adult 08/17/2019  . SOB (shortness of breath) 08/16/2019  . Lower extremity edema 08/16/2019  . Fall 08/08/2019  . Hypertension 08/08/2019  . COVID-19 virus infection 07/25/2019  . Elevated LFTs 07/25/2019  . COVID-19 07/25/2019  . Allergic contact dermatitis due to adhesives 05/23/2019  . Insomnia 05/23/2019  . SIRS (systemic inflammatory response syndrome) ([place]) 05/14/2019  . NSTEMI (non-ST elevated myocardial infarction) ([place]) 05/14/2019  . Hyperglycemia   . Neutropenia ([place])   . CAD (coronary artery disease)   . Obesity with serious comorbidity 04/16/2019  . Gastroenteritis 03/27/2019  . Falls frequently 03/27/2019  . Acute gastroenteritis 03/22/2019  . Hematoma of scalp 03/14/2019  . Vitamin D deficiency 11/09/2018  . Muscle spasm 10/19/2018  . Chest pain 09/07/2018  . Palpitations 09/07/2018  . Other fatigue 09/07/2018  .  Acute ST elevation myocardial infarction (STEMI) of inferior wall ([place]) 08/27/2018  . Atherosclerotic heart disease of native coronary artery with other forms of angina pectoris ([place]) 08/27/2018  . Acute vaginitis 08/20/2018  . Hyperlipidemia associated with type 2 diabetes mellitus ([place]) 04/09/2018  . Bronchitis 11/02/2017  . Sepsis ([place]) 11/02/2017  . Leucocytosis 11/02/2017  . Moderate persistent asthma with acute exacerbation 05/26/2017  . Influenza A 05/26/2017  . Acute pain of right shoulder 03/07/2017  . Acute bilateral low back pain without sciatica 03/07/2017  . Chronic pain 03/07/2017  . Fatty liver 03/24/2016  . Weakness 03/23/2016  . Unexplained weight loss 03/23/2016  . Melena 03/23/2016  . Hypotension 02/03/2016  . Syncope 02/03/2016  . Hypomagnesemia 02/03/2016  . Acute renal failure superimposed on stage 3 chronic kidney disease ([place]) 02/03/2016  . Gastrointestinal dysmotility 01/20/2016  . CKD (chronic kidney disease) stage 3, GFR 30-59 ml/min (HCC) 01/20/2016  . Abscess of right thigh 01/20/2016  . Chronic pain syndrome 01/15/2016  . Diarrhea 01/15/2016  . Generalized abdominal pain 01/14/2016  . Type 2 diabetes with nephropathy ([place]) 12/30/2015  . TIA (transient ischemic attack) 07/2015  . Morbid obesity due to excess calories ([place]) 05/05/2015  . Diabetic polyneuropathy associated with type 2 diabetes mellitus ([place]) 01/02/2015  . Acute bacterial sinusitis 01/02/2015  . Onychomycosis 11/04/2014  . Pain in lower limb 11/04/2014  . Multinodular goiter (nontoxic) 10/16/2014  . Iron deficiency anemia 05/20/2014  . Major depressive disorder, single episode, moderate ([place]) 05/20/2014  . Dizziness of unknown cause 04/28/2014  . MDD (major depressive disorder), recurrent episode, moderate ([place]) 01/28/2014  . GERD (gastroesophageal reflux  disease) 01/02/2014  . Nausea 10/15/2012  . Stomach cancer ([place]) 12/10/2008  . CHRONIC RESPIRATORY FAILURE 01/03/2008  .  Hyperlipidemia 06/27/2007  . OBESITY, MORBID 06/27/2007  . SLEEP APNEA, OBSTRUCTIVE 06/27/2007  . Generalized idiopathic epilepsy and epileptic syndromes, not intractable, with status epilepticus ([place]) 06/27/2007  . Asthma 06/27/2007  . OSTEOARTHRITIS  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 06/27/2007    Orientation RESPIRATION BLADDER Height & Weight     Self, Time, Situation, Place  Normal Continent, External catheter Weight: 205 lb 4 oz (93.1 kg) Height:  5' 6"  (167.6 cm)  BEHAVIORAL SYMPTOMS/MOOD NEUROLOGICAL BOWEL NUTRITION STATUS      Continent Diet(see d/c summary)  AMBULATORY STATUS COMMUNICATION OF NEEDS Skin   Extensive Assist Verbally Normal                       Personal Care Assistance Level of Assistance  Bathing, Feeding, Dressing Bathing Assistance: Limited assistance Feeding assistance: Limited assistance Dressing Assistance: Limited assistance     Functional Limitations Info    Sight Info: Adequate Hearing Info: Adequate Speech Info: Adequate    SPECIAL CARE FACTORS FREQUENCY  PT (By licensed PT), OT (By licensed OT)     PT Frequency: 5x a week OT Frequency: 5x a week            Contractures Contractures Info: Not present    Additional Factors Info  Isolation Precautions, Allergies, Code Status Code Status Info: Full Allergies Info: Cephalexin, Adhesive (Tape), Codeine, Zocor (Simvastatin - High Dose), Penicillins     Isolation Precautions Info: ESBL, Emerging Pat     Current Medications (09/04/2019):  This is the current hospital active medication list Current Facility-Administered Medications  Medication Dose Route Frequency Provider Last Rate Last Admin  . 0.9 %  sodium chloride infusion   Intravenous Continuous Lang Snow, FNP 75 mL/hr at 09/04/19 0706 New Bag at 09/04/19 0706  . 0.9 %  sodium chloride infusion   Intravenous Continuous Buccini, Robert, MD      . Doug Sou Hold] acetaminophen (TYLENOL) tablet 650 mg  650 mg Oral Q6H PRN Opyd,  Ilene Qua, MD       Or  . Doug Sou Hold] acetaminophen (TYLENOL) suppository 650 mg  650 mg Rectal Q6H PRN Opyd, Ilene Qua, MD      . Doug Sou Hold] albuterol (PROVENTIL) (2.5 MG/3ML) 0.083% nebulizer solution 2.5 mg  2.5 mg Inhalation Q6H PRN Opyd, Ilene Qua, MD      . Doug Sou Hold] darifenacin (ENABLEX) 24 hr tablet 7.5 mg  7.5 mg Oral Daily Opyd, Ilene Qua, MD   7.5 mg at 09/03/19 0952  . [MAR Hold] gabapentin (NEURONTIN) capsule 400 mg  400 mg Oral TID Vianne Bulls, MD   400 mg at 09/03/19 2212  . [MAR Hold] insulin aspart (novoLOG) injection 0-9 Units  0-9 Units Subcutaneous TID WC Opyd, Ilene Qua, MD   2 Units at 09/03/19 1232  . [MAR Hold] meropenem (MERREM) 1 g in sodium chloride 0.9 % 100 mL IVPB  1 g Intravenous Q12H Barb Merino, MD      . Doug Sou Hold] ondansetron [facility]) tablet 4 mg  4 mg Oral Q6H PRN Opyd, Ilene Qua, MD   4 mg at 09/02/19 2139   Or  . [MAR Hold] ondansetron (ZOFRAN) injection 4 mg  4 mg Intravenous Q6H PRN Opyd, Ilene Qua, MD      . Doug Sou Hold] pantoprazole (PROTONIX) EC tablet 40 mg  40 mg Oral  BID Barb Merino, MD   40 mg at 09/03/19 2212  . [MAR Hold] rosuvastatin (CRESTOR) tablet 20 mg  20 mg Oral q1800 Opyd, Ilene Qua, MD   20 mg at 09/03/19 1719  . [MAR Hold] sertraline (ZOLOFT) tablet 50 mg  50 mg Oral Daily Opyd, Ilene Qua, MD   50 mg at 09/03/19 0952  . [MAR Hold] sodium chloride flush (NS) 0.9 % injection 3 mL  3 mL Intravenous Q12H Opyd, Ilene Qua, MD   3 mL at 09/03/19 0106  . [MAR Hold] sucralfate (CARAFATE) tablet 1 g  1 g Oral BID Opyd, Ilene Qua, MD   1 g at 09/03/19 2212  . [MAR Hold] traMADol (ULTRAM) tablet 100 mg  100 mg Oral TID PRN Vianne Bulls, MD   100 mg at 09/02/19 2139   Facility-Administered Medications Ordered in Other Encounters  Medication Dose Route Frequency Provider Last Rate Last Admin  . sodium chloride flush (NS) 0.9 % injection 10 mL  10 mL Intravenous PRN [place], Sarah M, NP   10 mL at 09/13/16 1146     Discharge  Medications: Please see discharge summary for a list of discharge medications.  Relevant Imaging Results:  Relevant Lab Results:   Additional Information SSN [id number]  Arvella Merles, LCSW

## 2019-09-04 NOTE — Op Note (Signed)
[facility] Patient Name: [name] Procedure Date : 09/04/2019 MRN: [id number] Attending MD: Ronald Lobo , MD Date of Birth: Feb 27, 1951 CSN: [id number] Age: 69 Admit Type: Inpatient Procedure:                Upper GI en Indications:              Dysphagia, Heartburn, Chest pain (non cardiac),                            Personal history of malignant gastric neoplasm Providers:                Ronald Lobo, MD, Benay Pillow, RN, Lazaro Arms,                            Technician, Corie Chiquito, Technician, [place]                            Flowers, CRNA, Referring MD:              Medicines:                Monitored Anesthesia Care Complications:            No immediate complications. Estimated Blood Loss:     Estimated blood loss: none. Procedure:                Pre-Anesthesia Assessment:                           - Prior to the procedure, a History and Physical                            was performed, and patient medications and                            allergies were reviewed. The patient's tolerance of                            previous anesthesia was also reviewed. The risks                            and benefits of the procedure and the sedation                            options and risks were discussed with the patient.                            All questions were answered, and informed consent                            was obtained. Prior Anticoagulants: The patient has                            taken Plavix (clopidogrel), last dose was 1 day  prior to procedure. ASA Grade Assessment: IV - A                            patient with severe systemic disease that is a                            constant threat to life. After reviewing the risks                            and benefits, the patient was deemed in                            satisfactory condition to undergo the procedure.                           After obtaining  informed consent, the endoscope was                            passed under direct vision. Throughout the                            procedure, the patient's blood pressure, pulse, and                            oxygen saturations were monitored continuously. The                            [id number]-H190 ([id number] Olympus gastroscope was                            introduced through the mouth, and advanced to the                            second part of duodenum. The upper GI endoscopy was                            accomplished without difficulty. The patient                            tolerated the procedure well. Scope In: Scope Out: Findings:      The larynx was normal.      The examined esophagus was normal.      There is no endoscopic evidence of esophagitis, stricture, abnormal       motility or mass in the entire esophagus.      A small hiatal hernia was present.      Evidence of a patent Billroth II gastrojejunostomy was found. The       gastrojejunal anastomosis was characterized by healthy appearing mucosa.       This was traversed. The efferent limb was examined and was normal. The       afferent limb was examined and was normal.      A large amount of food (residue) and bile was found in the gastric       fundus.      The cardia  and gastric fundus were normal on retroflexion. Impression:               - Normal larynx.                           - Normal esophagus. No cause for chest pain or                            dysphagia endoscopically evident.                           - Small hiatal hernia.                           - Patent Billroth II gastrojejunostomy was found,                            characterized by healthy appearing mucosa. NO                            EVIDENCE OF RECURRENCE OF GASTRIC CANCER.                           - A large amount of food (residue) and bile in the                            stomach, which is not uncommon in post-gastrectomy                             patients.                           - No specimens collected. Recommendation:           - Use sucralfate suspension 1 gram PO QID for 2                            weeks for bile reflux.                           - Perform a cine-esophagram tomorrow for further                            evaluation of dysphagia symptoms.                           - Encourage frequent small low-residue meals. Procedure Code(s):        --- Professional ---                           [phone], Esophagogastroduodenoscopy, flexible,                            transoral; diagnostic, including collection of                            specimen(s) by brushing  or washing, when performed                            (separate procedure) Diagnosis Code(s):        --- Professional ---                           Z98.0, Intestinal bypass and anastomosis status                           R13.10, Dysphagia, unspecified                           R12, Heartburn                           R07.89, Other chest pain                           Z85.028, Personal history of other malignant                            neoplasm of stomach CPT copyright 2019 [employer]. All rights reserved. The codes documented in this report are preliminary and upon coder review may  be revised to meet current compliance requirements. Ronald Lobo, MD 09/04/2019 12:50:34 PM This report has been signed electronically. Number of Addenda: 0

## 2019-09-05 ENCOUNTER — Inpatient Hospital Stay (HOSPITAL_COMMUNITY): Payer: Medicare HMO

## 2019-09-05 DIAGNOSIS — R12 Heartburn: Secondary | ICD-10-CM | POA: Diagnosis not present

## 2019-09-05 DIAGNOSIS — Z85028 Personal history of other malignant neoplasm of stomach: Secondary | ICD-10-CM | POA: Diagnosis not present

## 2019-09-05 DIAGNOSIS — R1310 Dysphagia, unspecified: Secondary | ICD-10-CM | POA: Diagnosis not present

## 2019-09-05 DIAGNOSIS — K219 Gastro-esophageal reflux disease without esophagitis: Secondary | ICD-10-CM | POA: Diagnosis not present

## 2019-09-05 DIAGNOSIS — K224 Dyskinesia of esophagus: Secondary | ICD-10-CM | POA: Diagnosis not present

## 2019-09-05 DIAGNOSIS — R0789 Other chest pain: Secondary | ICD-10-CM | POA: Diagnosis not present

## 2019-09-05 LAB — CBC WITH DIFFERENTIAL/PLATELET
Abs Immature Granulocytes: 0.04 10*3/uL (ref 0.00–0.07)
Basophils Absolute: 0.1 10*3/uL (ref 0.0–0.1)
Basophils Relative: 1 %
Eosinophils Absolute: 0.1 10*3/uL (ref 0.0–0.5)
Eosinophils Relative: 1 %
HCT: 26.4 % — ABNORMAL LOW (ref 36.0–46.0)
Hemoglobin: 9.3 g/dL — ABNORMAL LOW (ref 12.0–15.0)
Immature Granulocytes: 0 %
Lymphocytes Relative: 33 %
Lymphs Abs: 3.4 10*3/uL (ref 0.7–4.0)
MCH: 29.4 pg (ref 26.0–34.0)
MCHC: 35.2 g/dL (ref 30.0–36.0)
MCV: 83.5 fL (ref 80.0–100.0)
Monocytes Absolute: 0.9 10*3/uL (ref 0.1–1.0)
Monocytes Relative: 9 %
Neutro Abs: 5.8 10*3/uL (ref 1.7–7.7)
Neutrophils Relative %: 56 %
Platelets: 275 10*3/uL (ref 150–400)
RBC: 3.16 MIL/uL — ABNORMAL LOW (ref 3.87–5.11)
RDW: 18.3 % — ABNORMAL HIGH (ref 11.5–15.5)
WBC: 10.3 10*3/uL (ref 4.0–10.5)
nRBC: 0 % (ref 0.0–0.2)

## 2019-09-05 LAB — BASIC METABOLIC PANEL
Anion gap: 6 (ref 5–15)
BUN: 11 mg/dL (ref 8–23)
CO2: 24 mmol/L (ref 22–32)
Calcium: 7.9 mg/dL — ABNORMAL LOW (ref 8.9–10.3)
Chloride: 114 mmol/L — ABNORMAL HIGH (ref 98–111)
Creatinine, Ser: 1.7 mg/dL — ABNORMAL HIGH (ref 0.44–1.00)
GFR calc Af Amer: 36 mL/min — ABNORMAL LOW (ref >60)
GFR calc non Af Amer: 31 mL/min — ABNORMAL LOW (ref >60)
Glucose, Bld: 115 mg/dL — ABNORMAL HIGH (ref 70–99)
Potassium: 4.4 mmol/L (ref 3.5–5.1)
Sodium: 144 mmol/L (ref 135–145)

## 2019-09-05 LAB — MAGNESIUM: Magnesium: 1.8 mg/dL (ref 1.7–2.4)

## 2019-09-05 LAB — MRSA PCR SCREENING: MRSA by PCR: POSITIVE — AB

## 2019-09-05 LAB — GLUCOSE, CAPILLARY
Glucose-Capillary: 97 mg/dL (ref 70–99)
Glucose-Capillary: 97 mg/dL (ref 70–99)
Glucose-Capillary: 100 mg/dL — ABNORMAL HIGH (ref 70–99)
Glucose-Capillary: 94 mg/dL (ref 70–99)

## 2019-09-05 MED ORDER — METOCLOPRAMIDE HCL 5 MG PO TABS
5.0000 mg | ORAL_TABLET | Freq: Three times a day (TID) | ORAL | Status: DC
  Administered 2019-09-05 – 2019-09-06 (×4): 5 mg via ORAL
  Filled 2019-09-05 (×4): qty 1

## 2019-09-05 MED ORDER — APIXABAN 5 MG PO TABS
5.0000 mg | ORAL_TABLET | Freq: Two times a day (BID) | ORAL | Status: DC
  Administered 2019-09-05 – 2019-09-06 (×3): 5 mg via ORAL
  Filled 2019-09-05 (×3): qty 1

## 2019-09-05 MED ORDER — CHLORHEXIDINE GLUCONATE CLOTH 2 % EX PADS
6.0000 | MEDICATED_PAD | Freq: Every day | CUTANEOUS | Status: DC
  Administered 2019-09-06: 06:00:00 6 via TOPICAL

## 2019-09-05 MED ORDER — CLOPIDOGREL BISULFATE 75 MG PO TABS
75.0000 mg | ORAL_TABLET | Freq: Every day | ORAL | Status: DC
  Administered 2019-09-05 – 2019-09-06 (×2): 75 mg via ORAL
  Filled 2019-09-05 (×2): qty 1

## 2019-09-05 MED ORDER — MUPIROCIN 2 % EX OINT
1.0000 "application " | TOPICAL_OINTMENT | Freq: Two times a day (BID) | CUTANEOUS | Status: DC
  Administered 2019-09-05 – 2019-09-06 (×2): 1 via NASAL
  Filled 2019-09-05 (×2): qty 22

## 2019-09-05 NOTE — Progress Notes (Signed)
PT Cancellation Note  Patient Details Name: [name] MRN: [id number] DOB: Feb 02, 1951   Cancelled Treatment:    Reason Eval/Treat Not Completed: Patient declined, no reason specified adamantly refused because "I don't feel like it and I"m leaving tomorrow anyway". Will follow acutely.    Windell Norfolk, DPT, PN1   Supplemental Physical Therapist [facility]    Pager [phone] Acute Rehab Office [phone]

## 2019-09-05 NOTE — Progress Notes (Signed)
PROGRESS NOTE    [name]  TDV:[id number] DOB: [birth year] DOA: 09/02/2019 PCP: Ann Held, DO    Brief Narrative:  69 year old with history of coronary artery disease status post RCA stent 12 months ago on Plavix, recent COVID-19 infection, right lower extremity DVT on Eliquis, multiple recent hospitalizations mostly related to poor appetite, epigastric pain and dehydration, type 2 diabetes, chronic kidney disease stage III, asthma, chronic pain syndrome presented from skilled nursing facility with complaints of few days of nausea without vomiting and midsternal chest pain that began one day PTA. With history of coronary artery disease, she was treated with nitroglycerin and aspirin prior to arrival with chest pain improved.  In the emergency room she was afebrile, she was slightly tachycardic.  Blood pressures well 67/41.  EKG was sinus rhythm.  Chest x-ray normal.  Potassium 5.2, albumin 1.7.  Creatinine 1.7 with baseline creatinine about 1.3 last month.  Troponins were nonischemic.  Lactic acid was slightly elevated.  Urinalysis was abnormal. Treated with IV fluids and admitted to the hospital.   Assessment & Plan:   Principal Problem:   Sepsis due to gram-negative UTI (HCC) Active Problems:   Asthma   Type 2 diabetes with nephropathy (HCC)   Hypotension   Acute renal failure superimposed on stage 3 chronic kidney disease (HCC)   Chronic pain   Chest pain   CAD (coronary artery disease)   FTT (failure to thrive) in adult   History of DVT (deep vein thrombosis)   Urinary tract infection due to extended-spectrum beta lactamase (ESBL) producing Escherichia coli  Hypotension: With sepsis present on admission due to ESBL UTI.  Sepsis resolved.  Also suspected hypovolemia.  UTI present on admission due to ESBL.  Treated with ciprofloxacin.  Resistant.   Started on meropenem, 2/10 and tolerating well. If overall remains a stable, will give 1 dose of fosfomycin tomorrow  before discharge to a skilled nursing facility.  Recurrent nausea, vomiting, poor appetite with history of stage Ib gastric cancer status post resection 10 years ago: Esophageal dysmotility and gastric dysmotility. Recurrent admission and dysphagia.   Eliquis and Plavix on hold, last dose 05/02/2020 morning.  Will resume today. On Protonix 40 mg twice daily.   Recent CAT scans were negative for any evidence of gastric abnormalities.   Underwent upper GI endoscopy, with no evidence of bleeding, ulcers or structural abnormality.  Found to have large amount of gastric food retaining. Barium esophagogram today shows very poor mobility. Bariatric diet.  Will start scheduled metoclopramide.  Atypical chest pain: Serial troponins and EKG nonischemic.  2D echocardiogram shows no evidence of abnormal wall motion.  Suspect GI symptoms.  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  Hold Metformin.  Keep on sliding scale insulin while in the hospital.  Chronic pain syndrome: Patient is already on tramadol that she will continue.  Acute kidney injury with history of underlying chronic kidney disease stage IIIa: Patient was treated with IV fluids.  Baseline creatinine 1.3.  Continues to improve.  Continue maintenance IV fluids.  Hypomagnesemia: Replaced.  Recent DVT: Resume Eliquis.  Do not anticipate surgery.  Hemoglobin is stable.  DVT prophylaxis: Eliquis Code Status: Full code Family Communication: Patient's daughter, 2/9 .   Disposition Plan: patient is from short-term skilled rehab. Anticipated DC to short-term skilled rehab, Barriers to discharge active symptoms,  Medication monitoring.  IV antibiotics. Anticipate tomorrow if everything remains stable.  Consultants:   Gastroenterology  Procedures:   None  Antimicrobials:  Anti-infectives (From admission, onward)   Start     Dose/Rate Route Frequency Ordered Stop   09/04/19 1000  meropenem (MERREM) 1 g in sodium chloride 0.9 % 100  mL IVPB     1 g 200 mL/hr over 30 Minutes Intravenous Every 12 hours 09/04/19 0908 09/08/19 2359   09/03/19 0930  ciprofloxacin (CIPRO) IVPB 400 mg  Status:  Discontinued     400 mg 200 mL/hr over 60 Minutes Intravenous Every 12 hours 09/03/19 0812 09/04/19 0906   09/02/19 2015  ceFEPIme (MAXIPIME) 2 g in sodium chloride 0.9 % 100 mL IVPB  Status:  Discontinued     2 g 200 mL/hr over 30 Minutes Intravenous Every 12 hours 09/02/19 2003 09/02/19 2034   09/02/19 2000  aztreonam (AZACTAM) 2 g in sodium chloride 0.9 % 100 mL IVPB  Status:  Discontinued     2 g 200 mL/hr over 30 Minutes Intravenous  Once 09/02/19 1948 09/02/19 2003   09/02/19 1900  ciprofloxacin (CIPRO) IVPB 400 mg  Status:  Discontinued     400 mg 200 mL/hr over 60 Minutes Intravenous  Once 09/02/19 1850 09/02/19 2039         Subjective: Patient seen and examined.  No overnight events.  Heart rate and blood pressure stable now. She has no nausea or vomiting today.  But she is n.p.o. also.  Objective: Vitals:   09/04/19 1700 09/04/19 2300 09/05/19 0340 09/05/19 0814  BP: 100/63 113/75 (!) 112/99 99/62  Pulse:  (!) 108  (!) 104  Resp:  17  15  Temp: 97.8 F (36.6 C) 99.3 F (37.4 C) 99.1 F (37.3 C)   TempSrc: Oral Oral Oral   SpO2:  100%  100%  Weight:   94.1 kg   Height:        Intake/Output Summary (Last 24 hours) at 09/05/2019 1134 Last data filed at 09/05/2019 0940 Gross per 24 hour  Intake 1622.86 ml  Output 1800 ml  Net -177.14 ml   Filed Weights   09/02/19 2316 09/04/19 0514 09/05/19 0340  Weight: 89.3 kg 93.1 kg 94.1 kg    Examination:  General exam: Appears calm and comfortable , patient is on room air.   Respiratory system: Clear to auscultation. Respiratory effort normal.  No added sounds.  She does have chest wall right-sided Port-A-Cath that is nontender. Cardiovascular system: S1 & S2 heard, RRR. No JVD, murmurs, rubs, gallops or clicks.  Gastrointestinal system: Abdomen is nondistended,  soft and no definite tenderness. No organomegaly or masses felt. Normal bowel sounds heard. Central nervous system: Alert and oriented. No focal neurological deficits. Extremities: Symmetric 5 x 5 power. Skin: No rashes, lesions or ulcers Psychiatry: Judgement and insight appear normal. Mood & affect appropriate.     Data Reviewed: I have personally reviewed following labs and imaging studies  CBC: Recent Labs  Lab 09/02/19 1217 09/03/19 0009 09/04/19 0405 09/05/19 0328  WBC 8.5 9.6 8.3 10.3  NEUTROABS 4.9 5.6 4.3 5.8  HGB 10.3* 10.5* 9.4* 9.3*  HCT 29.7* 30.7* 26.8* 26.4*  MCV 84.9 86.0 84.8 83.5  PLT 322 332 252 245   Basic Metabolic Panel: Recent Labs  Lab 09/02/19 1217 09/03/19 0009 09/04/19 0405 09/05/19 0328  NA 139 140 143 144  K 5.2* 4.3 4.6 4.4  CL 105 106 111 114*  CO2 26 26 22 24   GLUCOSE 87 88 124* 115*  BUN 11 9 10 11   CREATININE 1.70* 1.58* 1.63* 1.70*  CALCIUM 8.5* 8.5* 8.0*  7.9*  MG  --   --  1.6* 1.8  PHOS  --   --  3.9  --    GFR: Estimated Creatinine Clearance: 37.1 mL/min (A) (by C-G formula based on SCr of 1.7 mg/dL (H)). Liver Function Tests: Recent Labs  Lab 09/02/19 1217 09/03/19 0009  AST 47* 41  ALT 31 30  ALKPHOS 118 123  BILITOT 0.8 0.7  PROT 4.6* 4.9*  ALBUMIN 1.7* 1.7*   Recent Labs  Lab 09/02/19 1217  LIPASE 11   No results for input(s): AMMONIA in the last 168 hours. Coagulation Profile: No results for input(s): INR, PROTIME in the last 168 hours. Cardiac Enzymes: No results for input(s): CKTOTAL, CKMB, CKMBINDEX, TROPONINI in the last 168 hours. BNP (last 3 results) No results for input(s): PROBNP in the last 8760 hours. HbA1C: No results for input(s): HGBA1C in the last 72 hours. CBG: Recent Labs  Lab 09/03/19 2207 09/04/19 0731 09/04/19 1715 09/04/19 2116 09/05/19 0752  GLUCAP 120* 97 85 120* 97   Lipid Profile: No results for input(s): CHOL, HDL, LDLCALC, TRIG, CHOLHDL, LDLDIRECT in the last 72  hours. Thyroid Function Tests: No results for input(s): TSH, T4TOTAL, FREET4, T3FREE, THYROIDAB in the last 72 hours. Anemia Panel: No results for input(s): VITAMINB12, FOLATE, FERRITIN, TIBC, IRON, RETICCTPCT in the last 72 hours. Sepsis Labs: Recent Labs  Lab 09/03/19 0009 09/03/19 1054 09/04/19 0826  LATICACIDVEN 2.8* 2.1* 2.4*    Recent Results (from the past 240 hour(s))  Urine culture     Status: Abnormal   Collection Time: 09/02/19  5:07 PM   Specimen: Urine, Clean Catch  Result Value Ref Range Status   Specimen Description URINE, CLEAN CATCH  Final   Special Requests   Final    NONE Performed at [facility], [place] [address], [place], [place] [address]    Culture (A)  Final    >=100,000 COLONIES/mL ESCHERICHIA COLI Confirmed Extended Spectrum Beta-Lactamase Producer (ESBL).  In bloodstream infections from ESBL organisms, carbapenems are preferred over piperacillin/tazobactam. They are shown to have a lower risk of mortality.    Report Status 09/04/2019 FINAL  Final   Organism ID, Bacteria ESCHERICHIA COLI (A)  Final      Susceptibility   Escherichia coli - MIC*    AMPICILLIN >=32 RESISTANT Resistant     CEFAZOLIN >=64 RESISTANT Resistant     CEFTRIAXONE >=64 RESISTANT Resistant     CIPROFLOXACIN >=4 RESISTANT Resistant     GENTAMICIN <=1 SENSITIVE Sensitive     IMIPENEM <=0.25 SENSITIVE Sensitive     NITROFURANTOIN <=16 SENSITIVE Sensitive     TRIMETH/SULFA >=320 RESISTANT Resistant     AMPICILLIN/SULBACTAM 8 SENSITIVE Sensitive     PIP/TAZO <=4 SENSITIVE Sensitive     * >=100,000 COLONIES/mL ESCHERICHIA COLI         Radiology Studies: DG ESOPHAGUS W SINGLE CM (SOL OR THIN BA)  Result Date: 09/05/2019 CLINICAL DATA:  Concern for esophageal dysmotility and reflux, rule out stricture. History of partial gastrectomy. EXAM: ESOPHOGRAM/BARIUM SWALLOW TECHNIQUE: Single contrast examination was performed using thin barium and tablet. FLUOROSCOPY TIME:   Fluoroscopy Time:  4 minutes 6 seconds Number of Acquired Spot Images: 1 COMPARISON:  None. FINDINGS: Thin barium was administered under fluoroscopic guidance to evaluate esophageal motility. There is moderate to severe dysmotility with slow clearance of contrast particularly in the mid to distal portion of the esophagus. The esophagus completely distends without evidence of stricture ring. There is evidence of spontaneous as  well as provoked reflux to the level of the mid esophagus. The patient swallowed a barium tablet which became held up in the mid/distal portion of the esophagus and did not pass with additional sips of water. The tablet eventually passed with a sip of thick barium. IMPRESSION: 1.  Moderate/severe esophageal dysmotility. 2. Spontaneous and provoked reflux to the level of the mid esophagus. 3. Delayed passage of the barium tablet in the mid/distal esophagus. The tablet did not pass with additional sips of water but eventually passed with a sip of thick barium. The esophagus in this region appeared to fully distend, no fixed stricture identified. Electronically Signed   By: Audie Pinto M.D.   On: 09/05/2019 11:20        Scheduled Meds: . apixaban  5 mg Oral BID  . clopidogrel  75 mg Oral Daily  . darifenacin  7.5 mg Oral Daily  . gabapentin  400 mg Oral TID  . insulin aspart  0-9 Units Subcutaneous TID WC  . metoCLOPramide  5 mg Oral TID AC & HS  . pantoprazole  40 mg Oral BID  . rosuvastatin  20 mg Oral q1800  . sertraline  50 mg Oral Daily  . sodium chloride flush  3 mL Intravenous Q12H  . sucralfate  1 g Oral TID WC & HS   Continuous Infusions: . sodium chloride 75 mL/hr at 09/05/19 0339  . lactated ringers 20 mL/hr at 09/04/19 1140  . meropenem (MERREM) IV 1 g (09/05/19 0940)     LOS: 2 days    Time spent: 30 minutes    Barb Merino, MD [facility] Pager [phone]

## 2019-09-05 NOTE — Progress Notes (Signed)
Patient returned from Barium Swallow.

## 2019-09-05 NOTE — Progress Notes (Signed)
Patient's barium swallow showed significant esophageal dysmotility, without a stricture, but with transient delay in passage of the barium tablet through the esophagus.  This finding was discussed with the patient.  Yesterday's endoscopic findings were also reviewed.  The patient was somewhat upset that she could not have her desired food (a plate of spaghetti) for dinner last night, and that she was not made aware of the transition to the bariatric (frequent small meal) diet.  I actually take this as a good sign, in view of her interest in food.  Plan  1.  Advanced back to soft diet  2.  Patient carefully instructed in management of presbyesophagus or esophageal dysmotility, by dietary means: Small boluses of food, interspersed with consumption of liquids.  No role for esophageal dilatation in this setting  3.  Also instructed in a low residue diet because of the residual food debris noted at the time of her endoscopy, which may have been contributing to her symptoms of nausea and fullness and lack of appetite  4.  Unclear if the patient is benefiting from sucralfate, which she is currently receiving 4 times a day.  Would continue it while in-house; after discharge, she should have follow-up with Dr. Michail Sermon, her primary gastroenterologist, in perhaps 2 to 3 weeks, at which time a decision could be made about whether or not to continue it for a longer period of time.  Cleotis Nipper, M.D. Pager [phone] If no answer or after 5 PM call [phone]

## 2019-09-05 NOTE — Progress Notes (Signed)
Patients MRSA nasal swab +; standing orders for positive MRSA swab initiated per protocol  On call APP [place] notified.

## 2019-09-05 NOTE — TOC Progression Note (Addendum)
Transition of Care [facility]) - Progression Note    Patient Details  Name: [name] MRN: [id number] Date of Birth: [birth year]  Transition [facility] Care [facility]) CM/SW [place], [place] Phone Number: 09/05/2019, 1:44 PM  Clinical Narrative:    Update: CSW spoke with Tammy from [place] [phone]. CSW informed patient spent 11 days at [facility] and her co-pay of $184 will begin on day 21.  CSW spoke with patient and informed her of co-pay amount. Patient expressed understanding and asked CSW to contact her daughter [name] to provide the information. CSW contacted patient's daughter [name] [phone] and informed her of the information. Both agree to continue with placement to [facility]. Patient's daughter [name] requested to be contacted prior to patient's discharge.  CSW spoke with Ebony Hail [phone], she stated [facility] can offer a bed. Insurance authorization started.   Expected Discharge Plan: [place] Barriers to Discharge: Continued Medical Work up  Expected Discharge Plan and Services Expected Discharge Plan: [place]                                               Social Determinants of Health (SDOH) Interventions    Readmission Risk Interventions Readmission Risk Prevention Plan 05/16/2019  Transportation Screening Complete  Medication Review ([place]) Complete  HRI or [place] Complete  SW Recovery Care/Counseling Consult Complete  Palliative Care Screening Not [place] Not Applicable  Some recent data might be hidden

## 2019-09-05 NOTE — Progress Notes (Signed)
Patient taken for barium swallow at 1015hrs.

## 2019-09-06 DIAGNOSIS — I10 Essential (primary) hypertension: Secondary | ICD-10-CM | POA: Diagnosis not present

## 2019-09-06 DIAGNOSIS — E861 Hypovolemia: Secondary | ICD-10-CM | POA: Diagnosis not present

## 2019-09-06 DIAGNOSIS — N390 Urinary tract infection, site not specified: Secondary | ICD-10-CM | POA: Diagnosis not present

## 2019-09-06 DIAGNOSIS — B372 Candidiasis of skin and nail: Secondary | ICD-10-CM | POA: Diagnosis not present

## 2019-09-06 DIAGNOSIS — A4150 Gram-negative sepsis, unspecified: Secondary | ICD-10-CM | POA: Diagnosis not present

## 2019-09-06 DIAGNOSIS — R0789 Other chest pain: Secondary | ICD-10-CM | POA: Diagnosis not present

## 2019-09-06 DIAGNOSIS — A419 Sepsis, unspecified organism: Secondary | ICD-10-CM | POA: Diagnosis not present

## 2019-09-06 DIAGNOSIS — M6281 Muscle weakness (generalized): Secondary | ICD-10-CM | POA: Diagnosis not present

## 2019-09-06 DIAGNOSIS — C169 Malignant neoplasm of stomach, unspecified: Secondary | ICD-10-CM | POA: Diagnosis not present

## 2019-09-06 DIAGNOSIS — I2510 Atherosclerotic heart disease of native coronary artery without angina pectoris: Secondary | ICD-10-CM | POA: Diagnosis not present

## 2019-09-06 DIAGNOSIS — J45909 Unspecified asthma, uncomplicated: Secondary | ICD-10-CM | POA: Diagnosis not present

## 2019-09-06 DIAGNOSIS — R278 Other lack of coordination: Secondary | ICD-10-CM | POA: Diagnosis not present

## 2019-09-06 DIAGNOSIS — R52 Pain, unspecified: Secondary | ICD-10-CM | POA: Diagnosis not present

## 2019-09-06 DIAGNOSIS — L89159 Pressure ulcer of sacral region, unspecified stage: Secondary | ICD-10-CM | POA: Diagnosis not present

## 2019-09-06 DIAGNOSIS — H1031 Unspecified acute conjunctivitis, right eye: Secondary | ICD-10-CM | POA: Diagnosis not present

## 2019-09-06 DIAGNOSIS — I9589 Other hypotension: Secondary | ICD-10-CM | POA: Diagnosis not present

## 2019-09-06 DIAGNOSIS — E1121 Type 2 diabetes mellitus with diabetic nephropathy: Secondary | ICD-10-CM | POA: Diagnosis not present

## 2019-09-06 DIAGNOSIS — R5381 Other malaise: Secondary | ICD-10-CM | POA: Diagnosis not present

## 2019-09-06 DIAGNOSIS — M069 Rheumatoid arthritis, unspecified: Secondary | ICD-10-CM | POA: Diagnosis not present

## 2019-09-06 DIAGNOSIS — Z7401 Bed confinement status: Secondary | ICD-10-CM | POA: Diagnosis not present

## 2019-09-06 DIAGNOSIS — N1831 Chronic kidney disease, stage 3a: Secondary | ICD-10-CM | POA: Diagnosis not present

## 2019-09-06 DIAGNOSIS — Z209 Contact with and (suspected) exposure to unspecified communicable disease: Secondary | ICD-10-CM | POA: Diagnosis not present

## 2019-09-06 DIAGNOSIS — R1310 Dysphagia, unspecified: Secondary | ICD-10-CM | POA: Diagnosis not present

## 2019-09-06 DIAGNOSIS — R2689 Other abnormalities of gait and mobility: Secondary | ICD-10-CM | POA: Diagnosis not present

## 2019-09-06 DIAGNOSIS — I469 Cardiac arrest, cause unspecified: Secondary | ICD-10-CM | POA: Diagnosis not present

## 2019-09-06 DIAGNOSIS — B9620 Unspecified Escherichia coli [E. coli] as the cause of diseases classified elsewhere: Secondary | ICD-10-CM | POA: Diagnosis not present

## 2019-09-06 DIAGNOSIS — N179 Acute kidney failure, unspecified: Secondary | ICD-10-CM | POA: Diagnosis not present

## 2019-09-06 DIAGNOSIS — M2550 Pain in unspecified joint: Secondary | ICD-10-CM | POA: Diagnosis not present

## 2019-09-06 DIAGNOSIS — R12 Heartburn: Secondary | ICD-10-CM | POA: Diagnosis not present

## 2019-09-06 DIAGNOSIS — I82401 Acute embolism and thrombosis of unspecified deep veins of right lower extremity: Secondary | ICD-10-CM | POA: Diagnosis not present

## 2019-09-06 DIAGNOSIS — Z85028 Personal history of other malignant neoplasm of stomach: Secondary | ICD-10-CM | POA: Diagnosis not present

## 2019-09-06 DIAGNOSIS — Z86718 Personal history of other venous thrombosis and embolism: Secondary | ICD-10-CM | POA: Diagnosis not present

## 2019-09-06 LAB — GLUCOSE, CAPILLARY: Glucose-Capillary: 119 mg/dL — ABNORMAL HIGH (ref 70–99)

## 2019-09-06 MED ORDER — FOSFOMYCIN TROMETHAMINE 3 G PO PACK
3.0000 g | PACK | Freq: Once | ORAL | Status: AC
  Administered 2019-09-06: 10:00:00 3 g via ORAL
  Filled 2019-09-06 (×2): qty 3

## 2019-09-06 MED ORDER — TRAMADOL HCL 50 MG PO TABS: 100.0000 mg | ORAL_TABLET | Freq: Three times a day (TID) | ORAL | 0 refills | Status: AC | PRN

## 2019-09-06 MED ORDER — TRAMADOL HCL 50 MG PO TABS: 100.0000 mg | ORAL_TABLET | Freq: Three times a day (TID) | ORAL | 0 refills | Status: DC | PRN

## 2019-09-06 MED ORDER — METOPROLOL TARTRATE 12.5 MG HALF TABLET
12.5000 mg | ORAL_TABLET | Freq: Two times a day (BID) | ORAL | Status: DC
  Administered 2019-09-06: 10:00:00 12.5 mg via ORAL
  Filled 2019-09-06: qty 1

## 2019-09-06 NOTE — TOC Transition Note (Signed)
Transition of Care [facility]) - CM/SW Discharge Note   Patient Details  Name: LAFAWN LENOIR MRN: [id number] Date of Birth: May 23, 1951  Transition of Care [facility]) CM/SW Contact:  Arvella Merles, [place] Phone Number: [phone] 09/06/2019, 9:55 AM   Clinical Narrative:    Patient will DC to: [place] [facility] [place] date: 09/06/2019 Family notified: [name] [phone] Transport by: Corey Harold   Per MD patient ready for DC to [place]. RN, patient, patient's family, and facility notified of DC. Discharge Summary and FL2 sent to facility. RN to call report prior to discharge [phone] Room 114). DC packet on chart. Ambulance transport requested for patient.   CSW will sign off for now as social work intervention is no longer needed. Please consult [place] again if new needs arise.   Final next level of care: Skilled Nursing Facility Barriers to Discharge: No Barriers Identified   Patient Goals and CMS Choice Patient states their goals for this hospitalization and ongoing recovery are:: to return home CMS Medicare.gov Compare Post Acute Care list provided to:: Patient Choice offered to / list presented to : Patient  Discharge Placement   Existing PASRR number confirmed : 09/03/19          Patient chooses bed at: Other - please specify in the comment section below:([facility] [place]) Patient to be transferred to facility by: [place] Name of family member notified: Alanna Patient and family notified of of transfer: 09/06/19  Discharge Plan and Services                                     Social Determinants of Health (SDOH) Interventions     Readmission Risk Interventions Readmission Risk Prevention Plan 09/05/2019 09/05/2019 05/16/2019  Transportation Screening Complete - Complete  Medication Review [employer]) Complete - Complete  PCP or Specialist appointment within 3-5 days of discharge Complete - -  HRI or Home Care Consult  Patient refused - Complete  SW Recovery Care/Counseling Consult Complete - Complete  Palliative Care Screening Not Applicable Not Applicable Not Applicable  Skilled Nursing Facility Complete Complete Not Applicable  Some recent data might be hidden

## 2019-09-06 NOTE — Plan of Care (Signed)
  Problem: Pain Managment: Goal: General experience of comfort will improve Outcome: Progressing   Problem: Safety: Goal: Ability to remain free from injury will improve Outcome: Progressing   

## 2019-09-06 NOTE — Progress Notes (Signed)
Plans for discharge noted.  Patient was seen earlier today.  She indicates her stomach was doing much better.  She was pleased with the food selection.  I stopped her sucralfate because I am not clear that she needs it.  That is, I am not sure to what degree bile reflux is contributing to her symptoms, and I hate for her to be maintained on a medication that may not be necessary.  I advised the patient to contact her primary gastroenterologist, Dr. Cannon Kettle, if she has ongoing symptoms, in which case the medication might be resumed.  She may also want to contact him simply for routine follow-up.  Cleotis Nipper, M.D. Pager [phone] If no answer or after 5 PM call [phone]

## 2019-09-06 NOTE — TOC Transition Note (Addendum)
Transition of Care [facility]) - CM/SW Discharge Note   Patient Details  Name: MI BALLA MRN: [id number] Date of Birth: 19-May-1951  Transition of [facility]) CM/SW Contact:  Bartholomew Crews, RN Phone Number: [phone] 09/06/2019, 11:41 AM   Clinical Narrative:    Notified by liaison at [place] that insurance authorization received. MD notified for DC order/summary.   Patient to go to room 115; nurse to call report to [phone].   PTAR arranged for transport. Daughter notified of transition to SNF today. No further TOC needs identified at this time.    Final next level of care: Skilled Nursing Facility Barriers to Discharge: No Barriers Identified   Patient Goals and CMS Choice Patient states their goals for this hospitalization and ongoing recovery are:: to return home CMS Medicare.gov Compare Post Acute Care list provided to:: Patient Choice offered to / list presented to : Patient  Discharge Placement   Existing PASRR number confirmed : 09/03/19          Patient chooses bed at: Other - please specify in the comment section below:([facility] [place]) Patient to be transferred to facility by: [place] Name of family member notified: Alanna Patient and family notified of of transfer: 09/06/19  Discharge Plan and Services                                     Social Determinants of Health (SDOH) Interventions     Readmission Risk Interventions Readmission Risk Prevention Plan 09/05/2019 09/05/2019 05/16/2019  Transportation Screening Complete - Complete  Medication Review [employer]) Complete - Complete  PCP or Specialist appointment within 3-5 days of discharge Complete - -  HRI or Home Care Consult Patient refused - Complete  SW Recovery Care/Counseling Consult Complete - Complete  Palliative Care Screening Not Applicable Not Applicable Not Applicable  Skilled Nursing Facility Complete Complete Not Applicable  Some recent data might be hidden

## 2019-09-06 NOTE — Plan of Care (Signed)

## 2019-09-06 NOTE — Progress Notes (Signed)
Report given to RN at [place]. Pt awaiting transportation.

## 2019-09-06 NOTE — Discharge Summary (Signed)
Physician Discharge Summary  [name] RKY:[id number] DOB: 04-Jun-1951 DOA: 09/02/2019  PCP: Ann Held, DO  Admit date: 09/02/2019 Discharge date: 09/06/2019  Admitted From: Skilled nursing rehab Disposition: Skilled nursing rehab  Recommendations for Outpatient Follow-up:  1. Follow up with PCP in 1-2 weeks   Discharge Condition: Stable CODE STATUS: Full Diet recommendation: Low-carb diet, bariatric diet, frequently small portions and reflux prevention.  Discharge summary: 69 year old with history of coronary artery disease status post RCA stent 12 months ago on Plavix, recent COVID-19 infection, right lower extremity DVT on Eliquis, multiple recent hospitalizations mostly related to poor appetite, epigastric pain and dehydration, type 2 diabetes, chronic kidney disease stage III, asthma, chronic pain syndrome presented from skilled nursing facility with complaints of few days of nausea without vomiting and midsternal chest pain that began one day PTA. With history of coronary artery disease, she was treated with nitroglycerin and aspirin prior to arrival with chest pain improved.  In the emergency room she was afebrile, she was slightly tachycardic.  Blood pressures well 67/41.  EKG was sinus rhythm.  Chest x-ray normal.  Potassium 5.2, albumin 1.7.  Creatinine 1.7 with baseline creatinine about 1.3 last month.  Troponins were nonischemic.  Lactic acid was slightly elevated.  Urinalysis was abnormal. Treated with IV fluids and admitted to the hospital.  Hypotension: Sepsis ruled out.  Mostly related to hypovolemia with poor appetite and use of nitroglycerin. Blood pressures are low normal, however is stable.  Asymptomatic. Cortisol levels were normal.  Acute UTI present on admission.  With ESBL E. Coli. Treated with meropenem for 2 days in the hospital.  Will provide 1 dose of fosfomycin before discharge.  Blood cultures negative.  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 Recurrent admission and dysphagia.   On Protonix 40 mg twice daily.   Recent CAT scans were negative for any evidence of gastric abnormalities.   Underwent upper GI endoscopy, with no evidence of bleeding, ulcers or structural abnormality.  Found to have large amount of gastric food retaining. Barium esophagogram shows very poor mobility. Dietary modification.  No evidence of recurrence of cancer. Metoclopramide to use before meals.  Atypical chest pain: Serial troponins and EKG nonischemic.  2D echocardiogram shows no evidence of abnormal wall motion.  Suspect GI symptoms. Plavix and metoprolol resumed.  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    Regimen.    Chronic pain syndrome: Patient is already on tramadol that she will continue.  Acute kidney injury with history of underlying chronic kidney disease stage IIIa:  More or less at her usual levels.  No evidence of urinary obstruction. Recently her creatinine has been 1.6-1.7.  Will need close monitoring.  Recheck in 1 week.  Recent DVT: Resume Eliquis.    Hemoglobin is stable.  Patient is deconditioned, she will continue to need inpatient therapies and will benefit with continues inpatient therapies at skilled level of care.  Stable to transfer.   Discharge Diagnoses:  Principal Problem:   Sepsis due to gram-negative UTI ([place]) Active Problems:   Asthma   Type 2 diabetes with nephropathy (HCC)   Hypotension   Acute renal failure superimposed on stage 3 chronic kidney disease (HCC)   Chronic pain   Chest pain   CAD (coronary artery disease)   FTT (failure to thrive) in adult   History of DVT (deep vein thrombosis)   Urinary  tract infection due to extended-spectrum beta lactamase (ESBL) producing Escherichia coli    Discharge Instructions  Discharge Instructions    Call MD for:   persistant nausea and vomiting   Complete by: As directed    Diet Carb Modified   Complete by: As directed    Soft diet, multiple small portion and frequent food. Sit upright after eating for 2 hours   Increase activity slowly   Complete by: As directed      Allergies as of 09/06/2019      Reactions   Cephalexin Itching, Rash, Other (See Comments)   Sensation of throat closing   Adhesive [tape] Other (See Comments)   Burn Skin   Codeine Other (See Comments)   paranoid   Zocor [simvastatin - High Dose] Other (See Comments)   Muscle spasms      Medication List    STOP taking these medications   ipratropium-albuterol 0.5-2.5 (3) MG/3ML Soln Commonly known as: DUONEB     TAKE these medications   albuterol (2.5 MG/3ML) 0.083% nebulizer solution Commonly known as: PROVENTIL Take 3 mLs (2.5 mg total) by nebulization every 6 (six) hours as needed for wheezing or shortness of breath.   albuterol 108 (90 Base) MCG/ACT inhaler Commonly known as: VENTOLIN HFA Inhale 2 puffs into the lungs every 6 (six) hours as needed for wheezing or shortness of breath.   apixaban 5 MG Tabs tablet Commonly known as: ELIQUIS Take 1 tablet (5 mg total) by mouth 2 (two) times daily.   Centrum Silver Adult 50+ Tabs Take 1 tablet once a day What changed:   how much to take  how to take this  when to take this   clopidogrel 75 MG tablet Commonly known as: PLAVIX Take 1 tablet (75 mg total) by mouth daily.   darifenacin 7.5 MG 24 hr tablet Commonly known as: ENABLEX Take 7.5 mg by mouth daily.   feeding supplement (GLUCERNA SHAKE) Liqd Take 237 mLs by mouth 3 (three) times daily between meals.   gabapentin 400 MG capsule Commonly known as: NEURONTIN Take 1 capsule (400 mg total) by mouth 3 (three) times daily.   glucose blood test strip Commonly known as: OneTouch Verio Use as instructed to check blood sugar 3 times a day   lidocaine-prilocaine cream Commonly known as: EMLA Apply  1 application topically as needed. What changed: reasons to take this   metFORMIN 500 MG tablet Commonly known as: GLUCOPHAGE Take 1 tablet (500 mg total) by mouth 2 (two) times daily with a meal.   metoCLOPramide 5 MG tablet Commonly known as: REGLAN Take 1 tablet (5 mg total) by mouth every 8 (eight) hours as needed for nausea (before meals as needed for nausea.).   metoprolol tartrate 25 MG tablet Commonly known as: LOPRESSOR Take 0.5 tablets (12.5 mg total) by mouth 2 (two) times daily.   nitroGLYCERIN 0.4 MG SL tablet Commonly known as: Nitrostat Place 1 tablet (0.4 mg total) under the tongue every 5 (five) minutes as needed. What changed: reasons to take this   nystatin powder Commonly known as: MYCOSTATIN/NYSTOP Apply topically 3 (three) times daily.   pantoprazole 40 MG tablet Commonly known as: PROTONIX Take 1 tablet (40 mg total) by mouth daily.   rosuvastatin 20 MG tablet Commonly known as: CRESTOR Take 1 tablet (20 mg total) by mouth daily at 6 PM.   sertraline 50 MG tablet Commonly known as: ZOLOFT Take 1 tablet (50 mg total) by mouth daily.   sucralfate 1  g tablet Commonly known as: CARAFATE TAKE 1 TABLET BY MOUTH TWICE DAILY   traMADol 50 MG tablet Commonly known as: ULTRAM Take 2 tablets (100 mg total) by mouth 3 (three) times daily as needed for up to 5 days for moderate pain.   traZODone 50 MG tablet Commonly known as: DESYREL Take 0.5 tablets (25 mg total) by mouth at bedtime as needed for sleep.       Allergies  Allergen Reactions  . Cephalexin Itching, Rash and Other (See Comments)    Sensation of throat closing  . Adhesive [Tape] Other (See Comments)    Burn Skin  . Codeine Other (See Comments)    paranoid  . Zocor [Simvastatin - High Dose] Other (See Comments)    Muscle spasms    Consultations:  Gastroenterology   Procedures/Studies: CT ABDOMEN PELVIS WO CONTRAST  Result Date: 08/08/2019 CLINICAL DATA:  Nausea and vomiting.  Hypotension. Weakness and dizziness. Diarrhea. COVID-19. EXAM: CT ABDOMEN AND PELVIS WITHOUT CONTRAST TECHNIQUE: Multidetector CT imaging of the abdomen and pelvis was performed following the standard protocol without IV contrast. COMPARISON:  CT scan dated 03/22/2019 FINDINGS: Lower chest: There are small patchy peripheral infiltrates at both lung bases consistent with COVID pneumonia. Heart size is normal. Coronary artery calcifications. Coronary stents. No pericardial effusion. Small hiatal hernia, chronic. Hepatobiliary: Severe progressive hepatic steatosis. Gallbladder is distended. No dilated bile ducts. Pancreas: Diffuse pancreatic atrophy, unchanged. Otherwise negative. Spleen: Normal in size without focal abnormality. Adrenals/Urinary Tract: Normal adrenal glands. Slight left renal atrophy, chronic. Compensatory hypertrophy of the right kidney. No hydronephrosis. The visualized portion of the bladder is normal. Bladder detail is obscured by metallic artifact from the right hip prosthesis. Stomach/Bowel: Small hiatal hernia. Surgical staples in the stomach, duodenum, and at the tip of the cecum. Prior appendectomy. There are few diverticula in the sigmoid portion of the colon. No acute bowel abnormality. Vascular/Lymphatic: Aortic atherosclerosis. No enlarged abdominal or pelvic lymph nodes. Reproductive: The area of the uterus and ovaries are obscured by metallic artifact. Other: No abdominal wall hernia or abnormality. No abdominopelvic ascites. Musculoskeletal: No acute abnormalities. IMPRESSION: 1. Small patchy peripheral infiltrates at both lung bases consistent with COVID pneumonia. 2. Severe progressive hepatic steatosis. 3. No acute abnormality of the abdomen and pelvis. 4. Aortic Atherosclerosis (ICD10-I70.0). Electronically Signed   By: Lorriane Shire M.D.   On: 08/08/2019 15:36   CT HEAD WO CONTRAST  Result Date: 08/10/2019 CLINICAL DATA:  Headache.  Suspicion of intracranial hemorrhage. EXAM:  CT HEAD WITHOUT CONTRAST TECHNIQUE: Contiguous axial images were obtained from the base of the skull through the vertex without intravenous contrast. COMPARISON:  03/19/2018 FINDINGS: Brain: Mild age related atrophy. No evidence of old or acute infarction, mass lesion, hemorrhage, hydrocephalus or extra-axial collection. Vascular: There is atherosclerotic calcification of the major vessels at the base of the brain. Skull: Negative Sinuses/Orbits: Clear/normal Other: None IMPRESSION: Normal study for age.  No cause of headache identified. Electronically Signed   By: Nelson Chimes M.D.   On: 08/10/2019 21:00   US Abdomen Complete  Result Date: 08/10/2019 CLINICAL DATA:  69 year old female with abdominal pain. EXAM: ABDOMEN ULTRASOUND COMPLETE COMPARISON:  CT of the abdomen pelvis dated 08/08/2019 FINDINGS: Gallbladder: No gallstones or wall thickening visualized. No sonographic Murphy sign noted by sonographer. Common bile duct: Diameter: 3 mm Liver: There is diffuse increased liver echogenicity most commonly seen in the setting of fatty infiltration. Superimposed inflammation or fibrosis is not excluded. Clinical correlation is recommended. Portal vein  is patent on color Doppler imaging with normal direction of blood flow towards the liver. IVC: No abnormality visualized. Pancreas: Not well visualized. Spleen: Size and appearance within normal limits. Right Kidney: Length: 10 cm. Normal echogenicity. No hydronephrosis or shadowing stone. Left Kidney: Length: 9 cm. Normal echogenicity. No hydronephrosis or shadowing stone. Abdominal aorta: No aneurysm visualized. Other findings: None. IMPRESSION: Fatty liver, otherwise unremarkable abdominal ultrasound. Electronically Signed   By: Anner Crete M.D.   On: 08/10/2019 22:19   DG Chest Port 1 View  Result Date: 09/02/2019 CLINICAL DATA:  Chest pain. EXAM: PORTABLE CHEST 1 VIEW COMPARISON:  Chest x-ray dated August 16, 2019. FINDINGS: Unchanged right chest wall  port catheter. The heart size and mediastinal contours are within normal limits. Normal pulmonary vascularity. No focal consolidation, pleural effusion, or pneumothorax. No acute osseous abnormality. IMPRESSION: No active disease. Electronically Signed   By: Titus Dubin M.D.   On: 09/02/2019 12:28   DG Chest Portable 1 View  Result Date: 08/16/2019 CLINICAL DATA:  Hypoxia. EXAM: PORTABLE CHEST 1 VIEW COMPARISON:  August 08, 2019 FINDINGS: There is stable right-sided venous Port-A-Cath positioning. There is no evidence of acute infiltrate, pleural effusion or pneumothorax. The heart size and mediastinal contours are within normal limits. Degenerative changes seen within the thoracic spine. IMPRESSION: No active disease. Electronically Signed   By: Virgina Norfolk M.D.   On: 08/16/2019 19:06   DG Chest Port 1 View  Result Date: 08/08/2019 CLINICAL DATA:  Shortness of breath EXAM: PORTABLE CHEST 1 VIEW COMPARISON:  July 29, 2019 FINDINGS: Port-A-Cath tip is in the superior vena cava. No pneumothorax. There is a small right pleural effusion. There is no edema or consolidation. Heart is upper normal in size with pulmonary vascularity normal. No evident adenopathy. There is degenerative change in the left shoulder. IMPRESSION: Central catheter position unchanged without pneumothorax. Small right pleural effusion. No edema or consolidation. Stable cardiac silhouette. No demonstrable adenopathy. Electronically Signed   By: Lowella Grip III M.D.   On: 08/08/2019 14:38   DG Abd 2 Views  Result Date: 08/10/2019 CLINICAL DATA:  COVID-19 positive, acute onset lower abdominal pain. EXAM: ABDOMEN - 2 VIEW COMPARISON:  CT abdomen pelvis 08/08/2019 FINDINGS: Few contiguous loops of borderline air distended small bowel in the mid abdomen. Large volume of stool throughout the colon. Air and stool overlying the rectal vault. No suspicious calcifications. Prior right hip arthroplasty is noted. Degenerative  changes noted in the lumbar spine, pelvis and left hip. IMPRESSION: Few contiguous loops of increasingly air distended small bowel in the mid abdomen could reflect ileus or early obstruction. Electronically Signed   By: Lovena Le M.D.   On: 08/10/2019 17:55   ECHOCARDIOGRAM COMPLETE  Result Date: 09/03/2019    ECHOCARDIOGRAM REPORT   Patient Name:   [name] Colan Date of Exam: 09/03/2019 Medical Rec #:  [id number]    Height:       66.0 in Accession #:    [id number]   Weight:       196.9 lb Date of Birth:  [birth year]    BSA:          1.99 m Patient Age:    71 years     BP:           89/56 mmHg Patient Gender: F            HR:           76 bpm. Exam Location:  Inpatient Procedure: 2D  Echo, Color Doppler, Cardiac Doppler and Intracardiac            Opacification Agent Indications:    R07.9* Chest pain, unspecified  History:        Patient has prior history of Echocardiogram examinations, most                 recent 08/28/2018. Risk Factors:Hypertension, Diabetes,                 Dyslipidemia and Sleep Apnea.  Sonographer:    Raquel Sarna Senior RDCS Referring Phys: [id number] [place]  1. Left ventricular ejection fraction, by estimation, is 65 to 70%. The left ventricle has normal function. The left ventrical has no regional wall motion abnormalities. Left ventricular diastolic parameters are consistent with Grade I diastolic dysfunction (impaired relaxation).  2. Right ventricular systolic function is normal. The right ventricular size is normal. There is mildly elevated pulmonary artery systolic pressure.  3. Mild mitral valve regurgitation.  4. The aortic valve is normal in structure and function. Aortic valve regurgitation is trivial . No aortic stenosis is present.  5. The inferior vena cava is normal in size with greater than 50% respiratory variability, suggesting right atrial pressure of 3 mmHg. FINDINGS  Left Ventricle: Left ventricular ejection fraction, by estimation, is 65 to 70%. The left  ventricle has normal function. The left ventricle has no regional wall motion abnormalities. The left ventricular internal cavity size was normal in size. There is  no left ventricular hypertrophy. Left ventricular diastolic parameters are consistent with Grade I diastolic dysfunction (impaired relaxation). Right Ventricle: The right ventricular size is normal. No increase in right ventricular wall thickness. Right ventricular systolic function is normal. There is mildly elevated pulmonary artery systolic pressure. The tricuspid regurgitant velocity is 2.58  m/s, and with an assumed right atrial pressure of 8 mmHg, the estimated right ventricular systolic pressure is 28.7 mmHg. Left Atrium: Left atrial size was normal in size. Right Atrium: Right atrial size was normal in size. Pericardium: There is no evidence of pericardial effusion. Mitral Valve: The mitral valve is normal in structure and function. Normal mobility of the mitral valve leaflets. Mild mitral valve regurgitation. No evidence of mitral valve stenosis. Tricuspid Valve: The tricuspid valve is normal in structure. Tricuspid valve regurgitation is mild . No evidence of tricuspid stenosis. Aortic Valve: The aortic valve is normal in structure and function. Aortic valve regurgitation is trivial. No aortic stenosis is present. Pulmonic Valve: The pulmonic valve was normal in structure. Pulmonic valve regurgitation is not visualized. No evidence of pulmonic stenosis. Aorta: The aortic root is normal in size and structure. Venous: The inferior vena cava is normal in size with greater than 50% respiratory variability, suggesting right atrial pressure of 3 mmHg. The inferior vena cava and the hepatic vein show a normal flow pattern. IAS/Shunts: No atrial level shunt detected by color flow Doppler.  LEFT VENTRICLE PLAX 2D LVIDd:         4.52 cm  Diastology LVIDs:         2.66 cm  LV e' lateral:   8.27 cm/s LV PW:         1.12 cm  LV E/e' lateral: 7.0 LV IVS:         0.90 cm  LV e' medial:    5.55 cm/s LVOT diam:     1.90 cm  LV E/e' medial:  10.4 LV SV:         61.24  ml LV SV Index:   32.56 LVOT Area:     2.84 cm  RIGHT VENTRICLE RV S prime:     15.10 cm/s TAPSE (M-mode): 2.3 cm LEFT ATRIUM             Index       RIGHT ATRIUM           Index LA diam:        3.80 cm 1.91 cm/m  RA Area:     18.30 cm LA Vol (A2C):   42.8 ml 21.55 ml/m RA Volume:   45.90 ml  23.11 ml/m LA Vol (A4C):   55.4 ml 27.89 ml/m LA Biplane Vol: 52.1 ml 26.23 ml/m  AORTIC VALVE LVOT Vmax:   103.00 cm/s LVOT Vmean:  73.300 cm/s LVOT VTI:    0.216 m  AORTA Ao Root diam: 2.90 cm Ao Asc diam:  3.60 cm MITRAL VALVE                        TRICUSPID VALVE MV Area (PHT): 2.95 cm             TR Peak grad:   26.6 mmHg MV Decel Time: 257 msec             TR Vmax:        258.00 cm/s MV E velocity: 57.70 cm/s 103 cm/s MV A velocity: 80.50 cm/s 70.3 cm/s SHUNTS MV E/A ratio:  0.72       1.5       Systemic VTI:  0.22 m                                     Systemic Diam: 1.90 cm Fransico Him MD Electronically signed by Fransico Him MD Signature Date/Time: 09/03/2019/8:48:53 AM    Final    DG FEMUR 1V LEFT  Result Date: 08/10/2019 CLINICAL DATA:  Pain status post fall EXAM: LEFT FEMUR 1 VIEW COMPARISON:  None. 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 There is no acute displaced fracture. No dislocation. Evaluation is limited by single view technique. Vascular calcifications are noted. IMPRESSION: No definite acute displaced fracture. Electronically Signed   By: Constance Holster M.D.   On: 08/10/2019 19:36   DG FEMUR 1V RIGHT  Result Date: 08/10/2019 CLINICAL DATA:  Pain status post fall. EXAM: RIGHT FEMUR 1 VIEW COMPARISON:  None. FINDINGS: The patient is status post total hip arthroplasty on the right. Vascular calcifications are noted. There is no displaced fracture. No dislocation. There are few subtle lucencies coursing through the greater trochanter. There are  multicompartmental degenerative changes of the right knee. Osteopenia is noted. IMPRESSION: 1. Subtle lucencies are noted coursing through the greater trochanter. If there is clinical concern for a fracture at this level, consider dedicated right hip radiographs for further evaluation. 2. Status post total hip arthroplasty. 3. Multicompartmental degenerative changes are noted of the knee. Electronically Signed   By: Constance Holster M.D.   On: 08/10/2019 19:40   DG ESOPHAGUS W SINGLE CM (SOL OR THIN BA)  Result Date: 09/05/2019 CLINICAL DATA:  Concern for esophageal dysmotility and reflux, rule out stricture. History of partial gastrectomy. EXAM: ESOPHOGRAM/BARIUM SWALLOW TECHNIQUE: Single contrast examination was performed using thin barium and tablet. FLUOROSCOPY TIME:  Fluoroscopy Time:  4 minutes 6 seconds Number of Acquired Spot Images: 1 COMPARISON:  None.  FINDINGS: Thin barium was administered under fluoroscopic guidance to evaluate esophageal motility. There is moderate to severe dysmotility with slow clearance of contrast particularly in the mid to distal portion of the esophagus. The esophagus completely distends without evidence of stricture ring. There is evidence of spontaneous as well as provoked reflux to the level of the mid esophagus. The patient swallowed a barium tablet which became held up in the mid/distal portion of the esophagus and did not pass with additional sips of water. The tablet eventually passed with a sip of thick barium. IMPRESSION: 1.  Moderate/severe esophageal dysmotility. 2. Spontaneous and provoked reflux to the level of the mid esophagus. 3. Delayed passage of the barium tablet in the mid/distal esophagus. The tablet did not pass with additional sips of water but eventually passed with a sip of thick barium. The esophagus in this region appeared to fully distend, no fixed stricture identified. Electronically Signed   By: Audie Pinto M.D.   On: 09/05/2019 11:20      Subjective: Patient was seen and examined.  No overnight events.  Denies any nausea vomiting.  Denies any chest pain. Her main complaint is feeling dry mouth.   Discharge Exam: Vitals:   09/06/19 0616 09/06/19 0940  BP:    Pulse: 95 96  Resp: 14 15  Temp:    SpO2: 98% 100%   Vitals:   09/06/19 0100 09/06/19 0615 09/06/19 0616 09/06/19 0940  BP:  (!) 94/58    Pulse: (!) 102 95 95 96  Resp: 17 15 14 15   Temp:      TempSrc:      SpO2: 98% 99% 98% 100%  Weight:      Height:        General: Pt is alert, awake, not in acute distress Cardiovascular: RRR, S1/S2 +, no rubs, no gallops Respiratory: CTA bilaterally, no wheezing, no rhonchi Abdominal: Soft, NT, ND, bowel sounds + Extremities: no edema, no cyanosis    The results of significant diagnostics from this hospitalization (including imaging, microbiology, ancillary and laboratory) are listed below for reference.     Microbiology: Recent Results (from the past 240 hour(s))  Urine culture     Status: Abnormal   Collection Time: 09/02/19  5:07 PM   Specimen: Urine, Clean Catch  Result Value Ref Range Status   Specimen Description URINE, CLEAN CATCH  Final   Special Requests   Final    NONE Performed at [facility], [place] [address], [place], [place] [address]    Culture (A)  Final    >=100,000 COLONIES/mL ESCHERICHIA COLI Confirmed Extended Spectrum Beta-Lactamase Producer (ESBL).  In bloodstream infections from ESBL organisms, carbapenems are preferred over piperacillin/tazobactam. They are shown to have a lower risk of mortality.    Report Status 09/04/2019 FINAL  Final   Organism ID, Bacteria ESCHERICHIA COLI (A)  Final      Susceptibility   Escherichia coli - MIC*    AMPICILLIN >=32 RESISTANT Resistant     CEFAZOLIN >=64 RESISTANT Resistant     CEFTRIAXONE >=64 RESISTANT Resistant     CIPROFLOXACIN >=4 RESISTANT Resistant     GENTAMICIN <=1 SENSITIVE Sensitive     IMIPENEM <=0.25 SENSITIVE  Sensitive     NITROFURANTOIN <=16 SENSITIVE Sensitive     TRIMETH/SULFA >=320 RESISTANT Resistant     AMPICILLIN/SULBACTAM 8 SENSITIVE Sensitive     PIP/TAZO <=4 SENSITIVE Sensitive     * >=100,000 COLONIES/mL ESCHERICHIA COLI  MRSA PCR Screening  Status: Abnormal   Collection Time: 09/05/19  4:28 PM   Specimen: Nasal Mucosa; Nasopharyngeal  Result Value Ref Range Status   MRSA by PCR POSITIVE (A) NEGATIVE Final    Comment:        The GeneXpert MRSA Assay (FDA approved for NASAL specimens only), is one component of a comprehensive MRSA colonization surveillance program. It is not intended to diagnose MRSA infection nor to guide or monitor treatment for MRSA infections. RESULT CALLED TO, READ BACK BY AND VERIFIED WITHBronwen Betters RN 1850 09/05/19 A BROWNING Performed at [facility], [place] [address] [place], [place] [address]      Labs: BNP (last 3 results) Recent Labs    08/16/19 2022 09/02/19 1217  BNP 76.9 28.7   Basic Metabolic Panel: Recent Labs  Lab 09/02/19 1217 09/03/19 0009 09/04/19 0405 09/05/19 0328  NA 139 140 143 144  K 5.2* 4.3 4.6 4.4  CL 105 106 111 114*  CO2 26 26 22 24   GLUCOSE 87 88 124* 115*  BUN 11 9 10 11   CREATININE 1.70* 1.58* 1.63* 1.70*  CALCIUM 8.5* 8.5* 8.0* 7.9*  MG  --   --  1.6* 1.8  PHOS  --   --  3.9  --    Liver Function Tests: Recent Labs  Lab 09/02/19 1217 09/03/19 0009  AST 47* 41  ALT 31 30  ALKPHOS 118 123  BILITOT 0.8 0.7  PROT 4.6* 4.9*  ALBUMIN 1.7* 1.7*   Recent Labs  Lab 09/02/19 1217  LIPASE 11   No results for input(s): AMMONIA in the last 168 hours. CBC: Recent Labs  Lab 09/02/19 1217 09/03/19 0009 09/04/19 0405 09/05/19 0328  WBC 8.5 9.6 8.3 10.3  NEUTROABS 4.9 5.6 4.3 5.8  HGB 10.3* 10.5* 9.4* 9.3*  HCT 29.7* 30.7* 26.8* 26.4*  MCV 84.9 86.0 84.8 83.5  PLT 322 332 252 275   Cardiac Enzymes: No results for input(s): CKTOTAL, CKMB, CKMBINDEX, TROPONINI in the last 168  hours. BNP: Invalid input(s): POCBNP CBG: Recent Labs  Lab 09/05/19 0752 09/05/19 1134 09/05/19 1636 09/05/19 2125 09/06/19 0801  GLUCAP 97 97 100* 94 119*   D-Dimer No results for input(s): DDIMER in the last 72 hours. Hgb A1c No results for input(s): HGBA1C in the last 72 hours. Lipid Profile No results for input(s): CHOL, HDL, LDLCALC, TRIG, CHOLHDL, LDLDIRECT in the last 72 hours. Thyroid function studies No results for input(s): TSH, T4TOTAL, T3FREE, THYROIDAB in the last 72 hours.  Invalid input(s): FREET3 Anemia work up No results for input(s): VITAMINB12, FOLATE, FERRITIN, TIBC, IRON, RETICCTPCT in the last 72 hours. Urinalysis    Component Value Date/Time   COLORURINE YELLOW 09/02/2019 1707   APPEARANCEUR CLOUDY (A) 09/02/2019 1707   LABSPEC 1.006 09/02/2019 1707   LABSPEC 1.015 04/06/2015 0939   PHURINE 7.0 09/02/2019 1707   GLUCOSEU NEGATIVE 09/02/2019 1707   HGBUR SMALL (A) 09/02/2019 1707   BILIRUBINUR NEGATIVE 09/02/2019 1707   BILIRUBINUR 1+ 10/01/2018 1146   KETONESUR NEGATIVE 09/02/2019 1707   PROTEINUR 30 (A) 09/02/2019 1707   UROBILINOGEN 0.2 10/01/2018 1146   UROBILINOGEN 0.2 04/06/2015 0939   NITRITE NEGATIVE 09/02/2019 1707   LEUKOCYTESUR LARGE (A) 09/02/2019 1707   Sepsis Labs Invalid input(s): PROCALCITONIN,  WBC,  LACTICIDVEN Microbiology Recent Results (from the past 240 hour(s))  Urine culture     Status: Abnormal   Collection Time: 09/02/19  5:07 PM   Specimen: Urine, Clean Catch  Result Value Ref Range Status  Specimen Description URINE, CLEAN CATCH  Final   Special Requests   Final    NONE Performed at [facility], [place] [address] [place], [place] [address]    Culture (A)  Final    >=100,000 COLONIES/mL ESCHERICHIA COLI Confirmed Extended Spectrum Beta-Lactamase Producer (ESBL).  In bloodstream infections from ESBL organisms, carbapenems are preferred over piperacillin/tazobactam. They are shown to have a lower risk of  mortality.    Report Status 09/04/2019 FINAL  Final   Organism ID, Bacteria ESCHERICHIA COLI (A)  Final      Susceptibility   Escherichia coli - MIC*    AMPICILLIN >=32 RESISTANT Resistant     CEFAZOLIN >=64 RESISTANT Resistant     CEFTRIAXONE >=64 RESISTANT Resistant     CIPROFLOXACIN >=4 RESISTANT Resistant     GENTAMICIN <=1 SENSITIVE Sensitive     IMIPENEM <=0.25 SENSITIVE Sensitive     NITROFURANTOIN <=16 SENSITIVE Sensitive     TRIMETH/SULFA >=320 RESISTANT Resistant     AMPICILLIN/SULBACTAM 8 SENSITIVE Sensitive     PIP/TAZO <=4 SENSITIVE Sensitive     * >=100,000 COLONIES/mL ESCHERICHIA COLI  MRSA PCR Screening     Status: Abnormal   Collection Time: 09/05/19  4:28 PM   Specimen: Nasal Mucosa; Nasopharyngeal  Result Value Ref Range Status   MRSA by PCR POSITIVE (A) NEGATIVE Final    Comment:        The GeneXpert MRSA Assay (FDA approved for NASAL specimens only), is one component of a comprehensive MRSA colonization surveillance program. It is not intended to diagnose MRSA infection nor to guide or monitor treatment for MRSA infections. RESULT CALLED TO, READ BACK BY AND VERIFIED WITHBronwen Betters RN 1850 09/05/19 A BROWNING Performed at [facility], [place] [address] [place], [place] [address]      Time coordinating discharge: 40 minutes  SIGNED:   Barb Merino, MD  [facility] 09/06/2019, 10:24 AM

## 2019-09-09 DIAGNOSIS — C169 Malignant neoplasm of stomach, unspecified: Secondary | ICD-10-CM | POA: Diagnosis not present

## 2019-09-09 DIAGNOSIS — A419 Sepsis, unspecified organism: Secondary | ICD-10-CM | POA: Diagnosis not present

## 2019-09-09 DIAGNOSIS — I2510 Atherosclerotic heart disease of native coronary artery without angina pectoris: Secondary | ICD-10-CM | POA: Diagnosis not present

## 2019-09-09 DIAGNOSIS — R5381 Other malaise: Secondary | ICD-10-CM | POA: Diagnosis not present

## 2019-09-10 DIAGNOSIS — A419 Sepsis, unspecified organism: Secondary | ICD-10-CM | POA: Diagnosis not present

## 2019-09-10 DIAGNOSIS — I2510 Atherosclerotic heart disease of native coronary artery without angina pectoris: Secondary | ICD-10-CM | POA: Diagnosis not present

## 2019-09-10 DIAGNOSIS — J45909 Unspecified asthma, uncomplicated: Secondary | ICD-10-CM | POA: Diagnosis not present

## 2019-09-10 DIAGNOSIS — N390 Urinary tract infection, site not specified: Secondary | ICD-10-CM | POA: Diagnosis not present

## 2019-09-13 DIAGNOSIS — B372 Candidiasis of skin and nail: Secondary | ICD-10-CM | POA: Diagnosis not present

## 2019-09-13 DIAGNOSIS — H1031 Unspecified acute conjunctivitis, right eye: Secondary | ICD-10-CM | POA: Diagnosis not present

## 2019-09-13 DIAGNOSIS — M069 Rheumatoid arthritis, unspecified: Secondary | ICD-10-CM | POA: Diagnosis not present

## 2019-09-13 DIAGNOSIS — L89159 Pressure ulcer of sacral region, unspecified stage: Secondary | ICD-10-CM | POA: Diagnosis not present

## 2019-09-16 ENCOUNTER — Telehealth: Payer: Self-pay | Admitting: Family Medicine

## 2019-09-16 DIAGNOSIS — I2510 Atherosclerotic heart disease of native coronary artery without angina pectoris: Secondary | ICD-10-CM | POA: Diagnosis not present

## 2019-09-16 DIAGNOSIS — I10 Essential (primary) hypertension: Secondary | ICD-10-CM | POA: Diagnosis not present

## 2019-09-16 DIAGNOSIS — R5381 Other malaise: Secondary | ICD-10-CM | POA: Diagnosis not present

## 2019-09-16 DIAGNOSIS — J45909 Unspecified asthma, uncomplicated: Secondary | ICD-10-CM | POA: Diagnosis not present

## 2019-09-16 NOTE — Telephone Encounter (Signed)
Patient is currently at [facility] in [place] and states that she is dehydrated (pt states her skin is severely dry and peeling). Please would like Dr Carollee Herter to please advise her on what to do?     Best call back number for patient  # [phone]

## 2019-09-16 NOTE — Telephone Encounter (Signed)
She needs to ask to see the provider there She needs to drink more fluids

## 2019-09-16 NOTE — Telephone Encounter (Signed)
Please advice  

## 2019-09-17 ENCOUNTER — Other Ambulatory Visit: Payer: Self-pay

## 2019-09-17 ENCOUNTER — Encounter (HOSPITAL_COMMUNITY): Payer: Self-pay | Admitting: Internal Medicine

## 2019-09-17 ENCOUNTER — Emergency Department (HOSPITAL_COMMUNITY): Payer: Medicare HMO

## 2019-09-17 ENCOUNTER — Inpatient Hospital Stay (HOSPITAL_COMMUNITY): Payer: Medicare HMO

## 2019-09-17 ENCOUNTER — Inpatient Hospital Stay (HOSPITAL_COMMUNITY)
Admission: EM | Admit: 2019-09-17 | Discharge: 2019-09-27 | DRG: 871 | Disposition: A | Discharge status: Skilled Nursing Facility | Payer: Medicare HMO | Attending: Student | Admitting: Student

## 2019-09-17 DIAGNOSIS — Z7189 Other specified counseling: Secondary | ICD-10-CM | POA: Diagnosis not present

## 2019-09-17 DIAGNOSIS — Z86718 Personal history of other venous thrombosis and embolism: Secondary | ICD-10-CM

## 2019-09-17 DIAGNOSIS — L98492 Non-pressure chronic ulcer of skin of other sites with fat layer exposed: Secondary | ICD-10-CM | POA: Diagnosis not present

## 2019-09-17 DIAGNOSIS — K219 Gastro-esophageal reflux disease without esophagitis: Secondary | ICD-10-CM | POA: Diagnosis present

## 2019-09-17 DIAGNOSIS — Z888 Allergy status to other drugs, medicaments and biological substances status: Secondary | ICD-10-CM

## 2019-09-17 DIAGNOSIS — J90 Pleural effusion, not elsewhere classified: Secondary | ICD-10-CM | POA: Diagnosis not present

## 2019-09-17 DIAGNOSIS — N390 Urinary tract infection, site not specified: Secondary | ICD-10-CM | POA: Diagnosis not present

## 2019-09-17 DIAGNOSIS — G4733 Obstructive sleep apnea (adult) (pediatric): Secondary | ICD-10-CM | POA: Diagnosis not present

## 2019-09-17 DIAGNOSIS — Z6836 Body mass index (BMI) 36.0-36.9, adult: Secondary | ICD-10-CM | POA: Diagnosis not present

## 2019-09-17 DIAGNOSIS — R571 Hypovolemic shock: Secondary | ICD-10-CM | POA: Diagnosis present

## 2019-09-17 DIAGNOSIS — I252 Old myocardial infarction: Secondary | ICD-10-CM

## 2019-09-17 DIAGNOSIS — E871 Hypo-osmolality and hyponatremia: Secondary | ICD-10-CM | POA: Diagnosis present

## 2019-09-17 DIAGNOSIS — D508 Other iron deficiency anemias: Secondary | ICD-10-CM | POA: Diagnosis not present

## 2019-09-17 DIAGNOSIS — Z91048 Other nonmedicinal substance allergy status: Secondary | ICD-10-CM

## 2019-09-17 DIAGNOSIS — Z87891 Personal history of nicotine dependence: Secondary | ICD-10-CM

## 2019-09-17 DIAGNOSIS — I959 Hypotension, unspecified: Secondary | ICD-10-CM

## 2019-09-17 DIAGNOSIS — R5381 Other malaise: Secondary | ICD-10-CM | POA: Diagnosis not present

## 2019-09-17 DIAGNOSIS — Z7901 Long term (current) use of anticoagulants: Secondary | ICD-10-CM

## 2019-09-17 DIAGNOSIS — J690 Pneumonitis due to inhalation of food and vomit: Secondary | ICD-10-CM | POA: Diagnosis not present

## 2019-09-17 DIAGNOSIS — E1165 Type 2 diabetes mellitus with hyperglycemia: Secondary | ICD-10-CM | POA: Diagnosis not present

## 2019-09-17 DIAGNOSIS — R627 Adult failure to thrive: Secondary | ICD-10-CM | POA: Diagnosis present

## 2019-09-17 DIAGNOSIS — E46 Unspecified protein-calorie malnutrition: Secondary | ICD-10-CM | POA: Diagnosis not present

## 2019-09-17 DIAGNOSIS — Z903 Acquired absence of stomach [part of]: Secondary | ICD-10-CM

## 2019-09-17 DIAGNOSIS — Z7902 Long term (current) use of antithrombotics/antiplatelets: Secondary | ICD-10-CM

## 2019-09-17 DIAGNOSIS — Z20822 Contact with and (suspected) exposure to covid-19: Secondary | ICD-10-CM | POA: Diagnosis not present

## 2019-09-17 DIAGNOSIS — R6510 Systemic inflammatory response syndrome (SIRS) of non-infectious origin without acute organ dysfunction: Secondary | ICD-10-CM | POA: Diagnosis not present

## 2019-09-17 DIAGNOSIS — H538 Other visual disturbances: Secondary | ICD-10-CM | POA: Diagnosis not present

## 2019-09-17 DIAGNOSIS — E1121 Type 2 diabetes mellitus with diabetic nephropathy: Secondary | ICD-10-CM | POA: Diagnosis not present

## 2019-09-17 DIAGNOSIS — N179 Acute kidney failure, unspecified: Secondary | ICD-10-CM | POA: Diagnosis not present

## 2019-09-17 DIAGNOSIS — Z96641 Presence of right artificial hip joint: Secondary | ICD-10-CM | POA: Diagnosis present

## 2019-09-17 DIAGNOSIS — A419 Sepsis, unspecified organism: Principal | ICD-10-CM | POA: Diagnosis present

## 2019-09-17 DIAGNOSIS — Z9221 Personal history of antineoplastic chemotherapy: Secondary | ICD-10-CM

## 2019-09-17 DIAGNOSIS — R6521 Severe sepsis with septic shock: Secondary | ICD-10-CM | POA: Diagnosis present

## 2019-09-17 DIAGNOSIS — C169 Malignant neoplasm of stomach, unspecified: Secondary | ICD-10-CM | POA: Diagnosis present

## 2019-09-17 DIAGNOSIS — Z833 Family history of diabetes mellitus: Secondary | ICD-10-CM

## 2019-09-17 DIAGNOSIS — I2510 Atherosclerotic heart disease of native coronary artery without angina pectoris: Secondary | ICD-10-CM | POA: Diagnosis not present

## 2019-09-17 DIAGNOSIS — Z7984 Long term (current) use of oral hypoglycemic drugs: Secondary | ICD-10-CM

## 2019-09-17 DIAGNOSIS — Z820 Family history of epilepsy and other diseases of the nervous system: Secondary | ICD-10-CM

## 2019-09-17 DIAGNOSIS — E872 Acidosis: Secondary | ICD-10-CM | POA: Diagnosis present

## 2019-09-17 DIAGNOSIS — D509 Iron deficiency anemia, unspecified: Secondary | ICD-10-CM | POA: Diagnosis not present

## 2019-09-17 DIAGNOSIS — E870 Hyperosmolality and hypernatremia: Secondary | ICD-10-CM | POA: Diagnosis not present

## 2019-09-17 DIAGNOSIS — Z8616 Personal history of COVID-19: Secondary | ICD-10-CM

## 2019-09-17 DIAGNOSIS — Z79899 Other long term (current) drug therapy: Secondary | ICD-10-CM

## 2019-09-17 DIAGNOSIS — I9589 Other hypotension: Secondary | ICD-10-CM | POA: Diagnosis not present

## 2019-09-17 DIAGNOSIS — D573 Sickle-cell trait: Secondary | ICD-10-CM | POA: Diagnosis present

## 2019-09-17 DIAGNOSIS — E669 Obesity, unspecified: Secondary | ICD-10-CM | POA: Diagnosis not present

## 2019-09-17 DIAGNOSIS — Z955 Presence of coronary angioplasty implant and graft: Secondary | ICD-10-CM

## 2019-09-17 DIAGNOSIS — E785 Hyperlipidemia, unspecified: Secondary | ICD-10-CM | POA: Diagnosis present

## 2019-09-17 DIAGNOSIS — N1832 Chronic kidney disease, stage 3b: Secondary | ICD-10-CM | POA: Diagnosis not present

## 2019-09-17 DIAGNOSIS — M2550 Pain in unspecified joint: Secondary | ICD-10-CM | POA: Diagnosis not present

## 2019-09-17 DIAGNOSIS — I82401 Acute embolism and thrombosis of unspecified deep veins of right lower extremity: Secondary | ICD-10-CM | POA: Diagnosis not present

## 2019-09-17 DIAGNOSIS — Z85028 Personal history of other malignant neoplasm of stomach: Secondary | ICD-10-CM

## 2019-09-17 DIAGNOSIS — Z1624 Resistance to multiple antibiotics: Secondary | ICD-10-CM | POA: Diagnosis present

## 2019-09-17 DIAGNOSIS — E875 Hyperkalemia: Secondary | ICD-10-CM | POA: Diagnosis present

## 2019-09-17 DIAGNOSIS — Z4659 Encounter for fitting and adjustment of other gastrointestinal appliance and device: Secondary | ICD-10-CM

## 2019-09-17 DIAGNOSIS — L98422 Non-pressure chronic ulcer of back with fat layer exposed: Secondary | ICD-10-CM

## 2019-09-17 DIAGNOSIS — I130 Hypertensive heart and chronic kidney disease with heart failure and stage 1 through stage 4 chronic kidney disease, or unspecified chronic kidney disease: Secondary | ICD-10-CM | POA: Diagnosis present

## 2019-09-17 DIAGNOSIS — R519 Headache, unspecified: Secondary | ICD-10-CM | POA: Diagnosis not present

## 2019-09-17 DIAGNOSIS — N183 Chronic kidney disease, stage 3 unspecified: Secondary | ICD-10-CM | POA: Diagnosis present

## 2019-09-17 DIAGNOSIS — Z923 Personal history of irradiation: Secondary | ICD-10-CM

## 2019-09-17 DIAGNOSIS — F419 Anxiety disorder, unspecified: Secondary | ICD-10-CM | POA: Diagnosis present

## 2019-09-17 DIAGNOSIS — R531 Weakness: Secondary | ICD-10-CM | POA: Diagnosis not present

## 2019-09-17 DIAGNOSIS — E1140 Type 2 diabetes mellitus with diabetic neuropathy, unspecified: Secondary | ICD-10-CM | POA: Diagnosis present

## 2019-09-17 DIAGNOSIS — Z515 Encounter for palliative care: Secondary | ICD-10-CM | POA: Diagnosis not present

## 2019-09-17 DIAGNOSIS — F329 Major depressive disorder, single episode, unspecified: Secondary | ICD-10-CM | POA: Diagnosis present

## 2019-09-17 DIAGNOSIS — B9620 Unspecified Escherichia coli [E. coli] as the cause of diseases classified elsewhere: Secondary | ICD-10-CM | POA: Diagnosis present

## 2019-09-17 DIAGNOSIS — R197 Diarrhea, unspecified: Secondary | ICD-10-CM | POA: Diagnosis present

## 2019-09-17 DIAGNOSIS — K760 Fatty (change of) liver, not elsewhere classified: Secondary | ICD-10-CM | POA: Diagnosis present

## 2019-09-17 DIAGNOSIS — D638 Anemia in other chronic diseases classified elsewhere: Secondary | ICD-10-CM | POA: Diagnosis not present

## 2019-09-17 DIAGNOSIS — E1122 Type 2 diabetes mellitus with diabetic chronic kidney disease: Secondary | ICD-10-CM | POA: Diagnosis present

## 2019-09-17 DIAGNOSIS — L89152 Pressure ulcer of sacral region, stage 2: Secondary | ICD-10-CM | POA: Diagnosis present

## 2019-09-17 DIAGNOSIS — Z6833 Body mass index (BMI) 33.0-33.9, adult: Secondary | ICD-10-CM

## 2019-09-17 DIAGNOSIS — R21 Rash and other nonspecific skin eruption: Secondary | ICD-10-CM | POA: Diagnosis present

## 2019-09-17 DIAGNOSIS — N1831 Chronic kidney disease, stage 3a: Secondary | ICD-10-CM | POA: Diagnosis not present

## 2019-09-17 DIAGNOSIS — I129 Hypertensive chronic kidney disease with stage 1 through stage 4 chronic kidney disease, or unspecified chronic kidney disease: Secondary | ICD-10-CM | POA: Diagnosis present

## 2019-09-17 DIAGNOSIS — E860 Dehydration: Secondary | ICD-10-CM | POA: Diagnosis present

## 2019-09-17 DIAGNOSIS — E11649 Type 2 diabetes mellitus with hypoglycemia without coma: Secondary | ICD-10-CM | POA: Diagnosis not present

## 2019-09-17 DIAGNOSIS — R270 Ataxia, unspecified: Secondary | ICD-10-CM | POA: Diagnosis not present

## 2019-09-17 DIAGNOSIS — Z743 Need for continuous supervision: Secondary | ICD-10-CM | POA: Diagnosis not present

## 2019-09-17 DIAGNOSIS — R7402 Elevation of levels of lactic acid dehydrogenase (LDH): Secondary | ICD-10-CM | POA: Diagnosis not present

## 2019-09-17 DIAGNOSIS — Z7401 Bed confinement status: Secondary | ICD-10-CM | POA: Diagnosis not present

## 2019-09-17 DIAGNOSIS — E1151 Type 2 diabetes mellitus with diabetic peripheral angiopathy without gangrene: Secondary | ICD-10-CM | POA: Diagnosis present

## 2019-09-17 DIAGNOSIS — E2740 Unspecified adrenocortical insufficiency: Secondary | ICD-10-CM | POA: Diagnosis present

## 2019-09-17 DIAGNOSIS — F339 Major depressive disorder, recurrent, unspecified: Secondary | ICD-10-CM | POA: Diagnosis not present

## 2019-09-17 DIAGNOSIS — R34 Anuria and oliguria: Secondary | ICD-10-CM | POA: Diagnosis not present

## 2019-09-17 DIAGNOSIS — R7989 Other specified abnormal findings of blood chemistry: Secondary | ICD-10-CM | POA: Diagnosis not present

## 2019-09-17 DIAGNOSIS — Z4682 Encounter for fitting and adjustment of non-vascular catheter: Secondary | ICD-10-CM | POA: Diagnosis not present

## 2019-09-17 DIAGNOSIS — Z1612 Extended spectrum beta lactamase (ESBL) resistance: Secondary | ICD-10-CM | POA: Diagnosis present

## 2019-09-17 DIAGNOSIS — E861 Hypovolemia: Secondary | ICD-10-CM | POA: Diagnosis not present

## 2019-09-17 DIAGNOSIS — H5789 Other specified disorders of eye and adnexa: Secondary | ICD-10-CM | POA: Diagnosis present

## 2019-09-17 DIAGNOSIS — Z881 Allergy status to other antibiotic agents status: Secondary | ICD-10-CM

## 2019-09-17 DIAGNOSIS — L8990 Pressure ulcer of unspecified site, unspecified stage: Secondary | ICD-10-CM | POA: Insufficient documentation

## 2019-09-17 DIAGNOSIS — R000 Tachycardia, unspecified: Secondary | ICD-10-CM | POA: Diagnosis not present

## 2019-09-17 DIAGNOSIS — K2970 Gastritis, unspecified, without bleeding: Secondary | ICD-10-CM | POA: Diagnosis present

## 2019-09-17 DIAGNOSIS — Z885 Allergy status to narcotic agent status: Secondary | ICD-10-CM

## 2019-09-17 DIAGNOSIS — Z79891 Long term (current) use of opiate analgesic: Secondary | ICD-10-CM

## 2019-09-17 DIAGNOSIS — Z8673 Personal history of transient ischemic attack (TIA), and cerebral infarction without residual deficits: Secondary | ICD-10-CM

## 2019-09-17 DIAGNOSIS — Z96652 Presence of left artificial knee joint: Secondary | ICD-10-CM | POA: Diagnosis present

## 2019-09-17 DIAGNOSIS — E559 Vitamin D deficiency, unspecified: Secondary | ICD-10-CM | POA: Diagnosis present

## 2019-09-17 DIAGNOSIS — G8929 Other chronic pain: Secondary | ICD-10-CM | POA: Diagnosis present

## 2019-09-17 DIAGNOSIS — E876 Hypokalemia: Secondary | ICD-10-CM | POA: Diagnosis not present

## 2019-09-17 DIAGNOSIS — G4700 Insomnia, unspecified: Secondary | ICD-10-CM | POA: Diagnosis present

## 2019-09-17 LAB — COMPREHENSIVE METABOLIC PANEL
ALT: 26 U/L (ref 0–44)
AST: 64 U/L — ABNORMAL HIGH (ref 15–41)
Albumin: 1.4 g/dL — ABNORMAL LOW (ref 3.5–5.0)
Alkaline Phosphatase: 190 U/L — ABNORMAL HIGH (ref 38–126)
Anion gap: 11 (ref 5–15)
BUN: 12 mg/dL (ref 8–23)
CO2: 20 mmol/L — ABNORMAL LOW (ref 22–32)
Calcium: 8.3 mg/dL — ABNORMAL LOW (ref 8.9–10.3)
Chloride: 109 mmol/L (ref 98–111)
Creatinine, Ser: 1.88 mg/dL — ABNORMAL HIGH (ref 0.44–1.00)
GFR calc Af Amer: 31 mL/min — ABNORMAL LOW (ref >60)
GFR calc non Af Amer: 27 mL/min — ABNORMAL LOW (ref >60)
Glucose, Bld: 88 mg/dL (ref 70–99)
Potassium: 5.3 mmol/L — ABNORMAL HIGH (ref 3.5–5.1)
Sodium: 140 mmol/L (ref 135–145)
Total Bilirubin: 1.2 mg/dL (ref 0.3–1.2)
Total Protein: 5 g/dL — ABNORMAL LOW (ref 6.5–8.1)

## 2019-09-17 LAB — LACTIC ACID, PLASMA
Lactic Acid, Venous: 4.5 mmol/L (ref 0.5–1.9)
Lactic Acid, Venous: 4 mmol/L (ref 0.5–1.9)

## 2019-09-17 LAB — CBC
HCT: 31.3 % — ABNORMAL LOW (ref 36.0–46.0)
Hemoglobin: 10.8 g/dL — ABNORMAL LOW (ref 12.0–15.0)
MCH: 29.8 pg (ref 26.0–34.0)
MCHC: 34.5 g/dL (ref 30.0–36.0)
MCV: 86.5 fL (ref 80.0–100.0)
Platelets: 296 10*3/uL (ref 150–400)
RBC: 3.62 MIL/uL — ABNORMAL LOW (ref 3.87–5.11)
RDW: 17.4 % — ABNORMAL HIGH (ref 11.5–15.5)
WBC: 10.9 10*3/uL — ABNORMAL HIGH (ref 4.0–10.5)
nRBC: 0 % (ref 0.0–0.2)

## 2019-09-17 LAB — TROPONIN I (HIGH SENSITIVITY)
Troponin I (High Sensitivity): 19 ng/L — ABNORMAL HIGH (ref <18)
Troponin I (High Sensitivity): 17 ng/L (ref <18)

## 2019-09-17 LAB — LIPASE, BLOOD: Lipase: 10 U/L — ABNORMAL LOW (ref 11–51)

## 2019-09-17 LAB — BRAIN NATRIURETIC PEPTIDE: B Natriuretic Peptide: 138 pg/mL — ABNORMAL HIGH (ref 0.0–100.0)

## 2019-09-17 LAB — CBG MONITORING, ED
Glucose-Capillary: 68 mg/dL — ABNORMAL LOW (ref 70–99)
Glucose-Capillary: 55 mg/dL — ABNORMAL LOW (ref 70–99)
Glucose-Capillary: 76 mg/dL (ref 70–99)

## 2019-09-17 MED ORDER — PANTOPRAZOLE SODIUM 40 MG PO TBEC
40.0000 mg | DELAYED_RELEASE_TABLET | Freq: Every day | ORAL | Status: DC
  Administered 2019-09-18 – 2019-09-27 (×10): 40 mg via ORAL
  Filled 2019-09-17 (×10): qty 1

## 2019-09-17 MED ORDER — SODIUM CHLORIDE 0.9 % IV SOLN
2.0000 g | Freq: Two times a day (BID) | INTRAVENOUS | Status: DC
  Administered 2019-09-17: 2 g via INTRAVENOUS
  Filled 2019-09-17: qty 2

## 2019-09-17 MED ORDER — METRONIDAZOLE IN NACL 5-0.79 MG/ML-% IV SOLN
500.0000 mg | Freq: Once | INTRAVENOUS | Status: AC
  Administered 2019-09-17: 500 mg via INTRAVENOUS
  Filled 2019-09-17: qty 100

## 2019-09-17 MED ORDER — INSULIN ASPART 100 UNIT/ML ~~LOC~~ SOLN
0.0000 [IU] | Freq: Three times a day (TID) | SUBCUTANEOUS | Status: DC
  Administered 2019-09-19: 5 [IU] via SUBCUTANEOUS

## 2019-09-17 MED ORDER — ACETAMINOPHEN 650 MG RE SUPP: 650.0000 mg | Freq: Four times a day (QID) | RECTAL | Status: DC | PRN

## 2019-09-17 MED ORDER — CLOPIDOGREL BISULFATE 75 MG PO TABS
75.0000 mg | ORAL_TABLET | Freq: Every day | ORAL | Status: DC
  Administered 2019-09-18 – 2019-09-25 (×8): 75 mg via ORAL
  Filled 2019-09-17 (×8): qty 1

## 2019-09-17 MED ORDER — SODIUM CHLORIDE 0.9 % IV BOLUS
1000.0000 mL | Freq: Once | INTRAVENOUS | Status: AC
  Administered 2019-09-17: 1000 mL via INTRAVENOUS

## 2019-09-17 MED ORDER — ALBUTEROL SULFATE (2.5 MG/3ML) 0.083% IN NEBU: 2.5000 mg | INHALATION_SOLUTION | Freq: Four times a day (QID) | RESPIRATORY_TRACT | Status: DC | PRN

## 2019-09-17 MED ORDER — DARIFENACIN HYDROBROMIDE ER 7.5 MG PO TB24
7.5000 mg | ORAL_TABLET | Freq: Every day | ORAL | Status: DC
  Administered 2019-09-18 – 2019-09-20 (×3): 7.5 mg via ORAL
  Filled 2019-09-17 (×3): qty 1

## 2019-09-17 MED ORDER — METOCLOPRAMIDE HCL 10 MG PO TABS
5.0000 mg | ORAL_TABLET | Freq: Three times a day (TID) | ORAL | Status: DC | PRN
  Filled 2019-09-17: qty 1

## 2019-09-17 MED ORDER — SUCRALFATE 1 G PO TABS
1.0000 g | ORAL_TABLET | Freq: Two times a day (BID) | ORAL | Status: DC
  Administered 2019-09-18 – 2019-09-27 (×19): 1 g via ORAL
  Filled 2019-09-17 (×21): qty 1

## 2019-09-17 MED ORDER — ONDANSETRON HCL 4 MG PO TABS: 4.0000 mg | ORAL_TABLET | Freq: Four times a day (QID) | ORAL | Status: DC | PRN

## 2019-09-17 MED ORDER — ROSUVASTATIN CALCIUM 20 MG PO TABS
20.0000 mg | ORAL_TABLET | Freq: Every day | ORAL | Status: DC
  Administered 2019-09-19 – 2019-09-26 (×8): 20 mg via ORAL
  Filled 2019-09-17 (×8): qty 1

## 2019-09-17 MED ORDER — ACETAMINOPHEN 325 MG PO TABS
650.0000 mg | ORAL_TABLET | Freq: Four times a day (QID) | ORAL | Status: DC | PRN
  Administered 2019-09-22: 650 mg via ORAL
  Filled 2019-09-17: qty 2

## 2019-09-17 MED ORDER — VANCOMYCIN HCL IN DEXTROSE 1-5 GM/200ML-% IV SOLN
1000.0000 mg | Freq: Once | INTRAVENOUS | Status: DC
  Filled 2019-09-17: qty 200

## 2019-09-17 MED ORDER — GABAPENTIN 400 MG PO CAPS
400.0000 mg | ORAL_CAPSULE | Freq: Three times a day (TID) | ORAL | Status: DC
  Administered 2019-09-18: 400 mg via ORAL
  Filled 2019-09-17 (×2): qty 1

## 2019-09-17 MED ORDER — SODIUM CHLORIDE 0.9 % IV SOLN: 2.0000 g | Freq: Once | INTRAVENOUS | Status: DC

## 2019-09-17 MED ORDER — ONDANSETRON HCL 4 MG/2ML IJ SOLN
4.0000 mg | Freq: Four times a day (QID) | INTRAMUSCULAR | Status: DC | PRN
  Administered 2019-09-18 – 2019-09-24 (×3): 4 mg via INTRAVENOUS
  Filled 2019-09-17 (×3): qty 2

## 2019-09-17 MED ORDER — SODIUM CHLORIDE 0.9% FLUSH
3.0000 mL | Freq: Once | INTRAVENOUS | Status: AC
  Administered 2019-09-17: 3 mL via INTRAVENOUS

## 2019-09-17 MED ORDER — VANCOMYCIN HCL 1250 MG/250ML IV SOLN
1250.0000 mg | INTRAVENOUS | Status: DC
  Administered 2019-09-19: 1250 mg via INTRAVENOUS
  Filled 2019-09-17 (×2): qty 250

## 2019-09-17 MED ORDER — SODIUM CHLORIDE 0.9 % IV SOLN: INTRAVENOUS | Status: DC | PRN

## 2019-09-17 MED ORDER — ALBUTEROL SULFATE HFA 108 (90 BASE) MCG/ACT IN AERS: 2.0000 | INHALATION_SPRAY | Freq: Four times a day (QID) | RESPIRATORY_TRACT | Status: DC | PRN

## 2019-09-17 MED ORDER — VANCOMYCIN HCL 2000 MG/400ML IV SOLN
2000.0000 mg | Freq: Once | INTRAVENOUS | Status: AC
  Administered 2019-09-17: 2000 mg via INTRAVENOUS
  Filled 2019-09-17: qty 400

## 2019-09-17 MED ORDER — SERTRALINE HCL 50 MG PO TABS
50.0000 mg | ORAL_TABLET | Freq: Every day | ORAL | Status: DC
  Administered 2019-09-18 – 2019-09-27 (×10): 50 mg via ORAL
  Filled 2019-09-17 (×10): qty 1

## 2019-09-17 MED ORDER — APIXABAN 5 MG PO TABS
5.0000 mg | ORAL_TABLET | Freq: Two times a day (BID) | ORAL | Status: DC
  Administered 2019-09-17 – 2019-09-27 (×20): 5 mg via ORAL
  Filled 2019-09-17 (×15): qty 1
  Filled 2019-09-17: qty 2
  Filled 2019-09-17 (×4): qty 1

## 2019-09-17 NOTE — ED Notes (Signed)
IV team at bedside 

## 2019-09-17 NOTE — ED Notes (Signed)
CBG low, pt provided with orange juice, pt drank approx 250 mL.

## 2019-09-17 NOTE — ED Notes (Signed)
Orange juice given to pt d/t her low CBG of 68.

## 2019-09-17 NOTE — Telephone Encounter (Signed)
Spoke with patient.  Relayed [name] Chase's instructions to patient.

## 2019-09-17 NOTE — ED Triage Notes (Signed)
Pt presents to the ED by EMS from home for hypotension and weakness. Pt was recently discharged from a nursing home this morning. Pt reports that she had COVID in Dec. Pt has sacral ulcers present on arrival. Pt complaisn of generalized rash and redness to right eye. 80/50 supine. HR 110 NS. CBG 98.

## 2019-09-17 NOTE — H&P (Signed)
History and Physical    [name] BBC:[id number] DOB: 1951/07/20 DOA: 09/17/2019  PCP: Ann Held, DO  Patient coming from: Home.  History obtained from patient's daughter and patient.  Chief Complaint: Weakness low blood pressure.  HPI: [name] is a 69 y.o. female with history of diabetes mellitus type 2, CAD status post stenting about 12 months ago, recently diagnosed DVT, recently admitted for Covid about 2 months ago subsequent which patient also was admitted again for hypotension discharged to rehab on September 06, 2019 and was discharged from the rehab today and was not taking home when patient was found to be increasingly weak unable to ambulate and also was noted to have right eye congestion and swelling.  Per daughter patient usually ambulates poorly but over the last 1 has become progressively weak and after discharge from the rehab today patient is found to be more weak than usual.  Did not have any chest pain shortness of breath or nausea vomiting or diarrhea.  Given the weakness patient was brought to the ER.  ED Course: In the ER patient was hypotensive with a blood pressure systolic in the 50T with lactate of 4 chest x-ray shows pleural effusion with possible infiltrate.  EKG shows normal sinus rhythm.  Labs show BNP 138 WBC 10.9 hemoglobin 10.8 creatinine 1.8 high-sensitivity troponin was 19 and 17.  On exam patient also has a rash on the left lower quadrant of the abdomen which patient states was from superficial injury while transferring.  Also has a sacral decubitus stage II with no signs of any active infection.  Right eye looks congested.  And matted.  Patient had blood cultures drawn fluid bolus given for possible sepsis admit for further management.  Patient has had recent Covid infection repeat Covid test was not done.  Review of Systems: As per HPI, rest all negative.   Past Medical History:  Diagnosis Date  . Asthma   . Degenerative joint disease    . Diabetes mellitus   . Dyspnea   . GERD (gastroesophageal reflux disease)   . Hyperlipidemia    no longer on medication for this  . Hypertension    patient denies  . Myocardial infarction ([place]) 08/2018  . Neuropathy    bilateral hands and feet  . Sickle cell trait ([place])   . Sleep apnea   . stomach ca dx'd 2010   chemo/xrt comp 05/2009  . TIA (transient ischemic attack) 07/2015    Past Surgical History:  Procedure Laterality Date  . APPENDECTOMY    . CESAREAN SECTION    . COLON SURGERY     colonscopy  . COLONOSCOPY WITH PROPOFOL N/A 06/01/2016   Procedure: COLONOSCOPY WITH PROPOFOL;  Surgeon: Wilford Corner, MD;  Location: [facility] ENDOSCOPY;  Service: Endoscopy;  Laterality: N/A;  . CORONARY STENT INTERVENTION N/A 08/29/2018   Procedure: CORONARY STENT INTERVENTION;  Surgeon: Wellington Hampshire, MD;  Location: [place] CV LAB;  Service: Cardiovascular;  Laterality: N/A;  . CORONARY/GRAFT ACUTE MI REVASCULARIZATION N/A 08/27/2018   Procedure: Coronary/Graft Acute MI Revascularization;  Surgeon: Leonie Man, MD;  Location: [place] CV LAB;  Service: Cardiovascular;  Laterality: N/A;  . ESOPHAGOGASTRODUODENOSCOPY N/A 10/15/2012   Procedure: ESOPHAGOGASTRODUODENOSCOPY (EGD);  Surgeon: Lear Ng, MD;  Location: [facility] ENDOSCOPY;  Service: Endoscopy;  Laterality: N/A;  . ESOPHAGOGASTRODUODENOSCOPY N/A 01/15/2016   Procedure: ESOPHAGOGASTRODUODENOSCOPY (EGD);  Surgeon: Ronald Lobo, MD;  Location: [facility] ENDOSCOPY;  Service: Endoscopy;  Laterality: N/A;  .  ESOPHAGOGASTRODUODENOSCOPY (EGD) WITH PROPOFOL N/A 09/04/2019   Procedure: ESOPHAGOGASTRODUODENOSCOPY (EGD) WITH PROPOFOL;  Surgeon: Ronald Lobo, MD;  Location: [place];  Service: Endoscopy;  Laterality: N/A;  possible esophageal dilatation  . HERNIA REPAIR    . LEFT HEART CATH AND CORONARY ANGIOGRAPHY N/A 08/27/2018   Procedure: LEFT HEART CATH AND CORONARY ANGIOGRAPHY;  Surgeon: Leonie Man, MD;  Location: [place] CV LAB;  Service: Cardiovascular;  Laterality: N/A;  . SHOULDER SURGERY     Left  . TOTAL HIP ARTHROPLASTY     Right  . TOTAL KNEE ARTHROPLASTY     Left     reports that she quit smoking about 30 years ago. Her smoking use included cigarettes. She started smoking about 46 years ago. She has a 15.00 pack-year smoking history. She has never used smokeless tobacco. She reports that she does not drink alcohol or use drugs.  Allergies  Allergen Reactions  . Cephalexin Itching, Rash and Other (See Comments)    Sensation of throat closing  . Adhesive [Tape] Other (See Comments)    Burn Skin  . Codeine Other (See Comments)    paranoid  . Zocor [Simvastatin - High Dose] Other (See Comments)    Muscle spasms    Family History  Problem Relation Age of Onset  . Diabetes Brother   . Alzheimer's disease Father 94  . Cancer Mother 61  . Diabetes Maternal Grandmother   . Cancer Maternal Grandfather        lung  . Healthy Child   . Suicidality Neg Hx   . Depression Neg Hx   . Dementia Neg Hx   . Anxiety disorder Neg Hx     Prior to Admission medications   Medication Sig Start Date End Date Taking? Authorizing Provider  albuterol (PROVENTIL HFA;VENTOLIN HFA) 108 (90 Base) MCG/ACT inhaler Inhale 2 puffs into the lungs every 6 (six) hours as needed for wheezing or shortness of breath. 11/05/17   Roxan Hockey, MD  albuterol (PROVENTIL) (2.5 MG/3ML) 0.083% nebulizer solution Take 3 mLs (2.5 mg total) by nebulization every 6 (six) hours as needed for wheezing or shortness of breath. 11/05/17   Roxan Hockey, MD  apixaban (ELIQUIS) 5 MG TABS tablet Take 1 tablet (5 mg total) by mouth 2 (two) times daily. 08/19/19   Bonnielee Haff, MD  clopidogrel (PLAVIX) 75 MG tablet Take 1 tablet (75 mg total) by mouth daily. 05/13/19   Black, Lezlie Octave, NP  darifenacin (ENABLEX) 7.5 MG 24 hr tablet Take 7.5 mg by mouth daily. 07/23/19   [provider]  feeding supplement, GLUCERNA SHAKE,  (GLUCERNA SHAKE) LIQD Take 237 mLs by mouth 3 (three) times daily between meals. 08/14/19   Swayze, Ava, DO  gabapentin (NEURONTIN) 400 MG capsule Take 1 capsule (400 mg total) by mouth 3 (three) times daily. 08/14/19   Swayze, Ava, DO  glucose blood (ONETOUCH VERIO) test strip Use as instructed to check blood sugar 3 times a day 09/04/18   Philemon Kingdom, MD  lidocaine-prilocaine (EMLA) cream Apply 1 application topically as needed. Patient taking differently: Apply 1 application topically as needed (to numb port site.).  02/28/19   Volanda Napoleon, MD  metFORMIN (GLUCOPHAGE) 500 MG tablet Take 1 tablet (500 mg total) by mouth 2 (two) times daily with a meal. 02/13/19   Philemon Kingdom, MD  metoCLOPramide (REGLAN) 5 MG tablet Take 1 tablet (5 mg total) by mouth every 8 (eight) hours as needed for nausea (before meals as needed for  nausea.). 08/23/19   Arrien, Jimmy Picket, MD  metoprolol tartrate (LOPRESSOR) 25 MG tablet Take 0.5 tablets (12.5 mg total) by mouth 2 (two) times daily. 08/14/19   Swayze, Ava, DO  Multiple Vitamins-Minerals (CENTRUM SILVER ADULT 50+) TABS Take 1 tablet once a day Patient taking differently: Take 1 tablet by mouth daily. Take 1 tablet once a day 02/20/19   Carollee Herter, Alferd Apa, DO  nitroGLYCERIN (NITROSTAT) 0.4 MG SL tablet Place 1 tablet (0.4 mg total) under the tongue every 5 (five) minutes as needed. Patient taking differently: Place 0.4 mg under the tongue every 5 (five) minutes as needed for chest pain.  01/14/19   Lorretta Harp, MD  nystatin (MYCOSTATIN/NYSTOP) powder Apply topically 3 (three) times daily. 08/14/19   Swayze, Ava, DO  pantoprazole (PROTONIX) 40 MG tablet Take 1 tablet (40 mg total) by mouth daily. 07/16/19   Ann Held, DO  rosuvastatin (CRESTOR) 20 MG tablet Take 1 tablet (20 mg total) by mouth daily at 6 PM. 08/14/19   Swayze, Ava, DO  sertraline (ZOLOFT) 50 MG tablet Take 1 tablet (50 mg total) by mouth daily. 02/20/19   Roma Schanz R, DO  sucralfate (CARAFATE) 1 g tablet TAKE 1 TABLET BY MOUTH TWICE DAILY Patient taking differently: Take 1 g by mouth 2 (two) times daily.  02/05/19   [place], Holli Humbles, NP  traZODone (DESYREL) 50 MG tablet Take 0.5 tablets (25 mg total) by mouth at bedtime as needed for sleep. 08/14/19   Swayze, Ava, DO    Physical Exam: Constitutional: Moderately built and nourished. Vitals:   09/17/19 2030 09/17/19 2100 09/17/19 2115 09/17/19 2130  BP: 102/77 (!) 88/66 (!) 90/58 96/64  Pulse: (!) 102 (!) 102 100 100  Resp: 12 20 18  (!) 22  Temp:      TempSrc:      SpO2: 98% 97% 98% 99%  Weight:      Height:       Eyes: Anicteric no pallor.  Right eye is congested and matted.  Mildly swollen. ENMT: No discharge from the ears eyes nose or mouth. Neck: No mass or.  No neck rigidity. Respiratory: No rhonchi or crepitations. Cardiovascular: S1-S2 heard. Abdomen: Soft nontender bowel sounds present. Musculoskeletal: No edema. Skin: Rash on the left lower quadrant of the abdomen and sacral decubitus stage II. Neurologic: Alert awake oriented to time place and person.  Moves all extremities. Psychiatric: Appears normal with normal affect.   Labs on Admission: I have personally reviewed following labs and imaging studies  CBC: Recent Labs  Lab 09/17/19 1725  WBC 10.9*  HGB 10.8*  HCT 31.3*  MCV 86.5  PLT 366   Basic Metabolic Panel: Recent Labs  Lab 09/17/19 1725  NA 140  K 5.3*  CL 109  CO2 20*  GLUCOSE 88  BUN 12  CREATININE 1.88*  CALCIUM 8.3*   GFR: Estimated Creatinine Clearance: 33.7 mL/min (A) (by C-G formula based on SCr of 1.88 mg/dL (H)). Liver Function Tests: Recent Labs  Lab 09/17/19 1725  AST 64*  ALT 26  ALKPHOS 190*  BILITOT 1.2  PROT 5.0*  ALBUMIN 1.4*   Recent Labs  Lab 09/17/19 1725  LIPASE 10*   No results for input(s): AMMONIA in the last 168 hours. Coagulation Profile: No results for input(s): INR, PROTIME in the last 168 hours.  Cardiac Enzymes: No results for input(s): CKTOTAL, CKMB, CKMBINDEX, TROPONINI in the last 168 hours. BNP (last 3 results) No results for  input(s): PROBNP in the last 8760 hours. HbA1C: No results for input(s): HGBA1C in the last 72 hours. CBG: Recent Labs  Lab 09/17/19 1745 09/17/19 2004 09/17/19 2048  GLUCAP 68* 55* 76   Lipid Profile: No results for input(s): CHOL, HDL, LDLCALC, TRIG, CHOLHDL, LDLDIRECT in the last 72 hours. Thyroid Function Tests: No results for input(s): TSH, T4TOTAL, FREET4, T3FREE, THYROIDAB in the last 72 hours. Anemia Panel: No results for input(s): VITAMINB12, FOLATE, FERRITIN, TIBC, IRON, RETICCTPCT in the last 72 hours. Urine analysis:    Component Value Date/Time   COLORURINE YELLOW 09/02/2019 1707   APPEARANCEUR CLOUDY (A) 09/02/2019 1707   LABSPEC 1.006 09/02/2019 1707   LABSPEC 1.015 04/06/2015 0939   PHURINE 7.0 09/02/2019 1707   GLUCOSEU NEGATIVE 09/02/2019 1707   HGBUR SMALL (A) 09/02/2019 1707   BILIRUBINUR NEGATIVE 09/02/2019 1707   BILIRUBINUR 1+ 10/01/2018 1146   KETONESUR NEGATIVE 09/02/2019 1707   PROTEINUR 30 (A) 09/02/2019 1707   UROBILINOGEN 0.2 10/01/2018 1146   UROBILINOGEN 0.2 04/06/2015 0939   NITRITE NEGATIVE 09/02/2019 1707   LEUKOCYTESUR LARGE (A) 09/02/2019 1707   Sepsis Labs: @LABRCNTIP (procalcitonin:4,lacticidven:4) )No results found for this or any previous visit (from the past 240 hour(s)).   Radiological Exams on Admission: DG Chest Portable 1 View  Result Date: 09/17/2019 CLINICAL DATA:  69 year old female with hypotension EXAM: PORTABLE CHEST 1 VIEW COMPARISON:  Chest radiograph dated 09/02/2019. FINDINGS: Right-sided PICC with tip over central SVC in similar position. New small left pleural effusion and left lung base atelectasis. Infiltrate is not excluded. Clinical correlation is recommended. The right lung remains clear. No pneumothorax. Stable cardiomediastinal silhouette. No acute osseous pathology.  IMPRESSION: Small left pleural effusion and left lung base atelectasis or infiltrate. Clinical correlation is recommended. No other interval change. Electronically Signed   By: Anner Crete M.D.   On: 09/17/2019 17:44    EKG: Independently reviewed.  Normal sinus rhythm.  Assessment/Plan Principal Problem:   Hypotension Active Problems:   Stomach cancer (HCC)   SLEEP APNEA, OBSTRUCTIVE   Iron deficiency anemia   Type 2 diabetes with nephropathy (HCC)   CKD (chronic kidney disease) stage 3, GFR 30-59 ml/min (HCC)   History of DVT (deep vein thrombosis)    1. Hypotension cause not clear.  However his lactate is significantly elevated we will keep patient on empiric antibiotics for possible sepsis check procalcitonin blood cultures UA is pending.  Check cortisol levels.  Continue hydration.  Hold antihypertensives. 2. Right eye congestive could be from possible conjunctivitis for which I have placed patient on eyedrops.  CT orbits are pending since patient swelling around the right periorbital area. 3. Generalized weakness could be from hypotension.  Check CT head. 4. Sacral decubitus ulcer stage II and also rash on the left lower groin from significant injury when transferring. 5. Diabetes mellitus type II we will keep patient sliding scale coverage. 6. CAD status post stenting denies any chest pain.  Continue antiplatelet agents patient is also on apixaban and statins.  Holding beta-blocker due to low blood pressure. 7. Anemia appears to be chronic follow CBC. 8. Chronic kidney disease stage III with mild hyperkalemia.  Recheck metabolic panel after hydration.  Creatinine appears to be at baseline. 9. History of stomach cancer. 10. History of DVT on apixaban. 11. History of sleep apnea. 12. Recent Covid infection.  Given that patient has significant hypotension with multiple comorbidities will need close monitoring for any further deterioration and will need inpatient status.    DVT  prophylaxis: Apixaban. Code Status: Full code. Family Communication: Patient's daughter. Disposition Plan: To be determined. Consults called: None. Admission status: Inpatient.   Rise Patience MD [facility] Pager [phone].  If 7PM-7AM, please contact night-coverage [email or web address] Password [email or web address]  09/17/2019, 10:00 PM

## 2019-09-17 NOTE — Progress Notes (Signed)
Pharmacy Antibiotic Note  [name] is a 69 y.o. female admitted on 09/17/2019 with sepsis.  Pharmacy has been consulted for Cefepime and Vancomycin dosing.   Height: 5' 6"  (167.6 cm) Weight: 209 lb 7 oz (95 kg) IBW/kg (Calculated) : 59.3  Temp (24hrs), Avg:98.8 F (37.1 C), Min:98.8 F (37.1 C), Max:98.8 F (37.1 C)  Recent Labs  Lab 09/17/19 1725 09/17/19 1905  WBC 10.9*  --   CREATININE 1.88*  --   LATICACIDVEN 4.5* 4.0*    Estimated Creatinine Clearance: 33.7 mL/min (A) (by C-G formula based on SCr of 1.88 mg/dL (H)).    Allergies  Allergen Reactions  . Cephalexin Itching, Rash and Other (See Comments)    Sensation of throat closing  . Adhesive [Tape] Other (See Comments)    Burn Skin  . Codeine Other (See Comments)    paranoid  . Zocor [Simvastatin - High Dose] Other (See Comments)    Muscle spasms    Antimicrobials this admission: 2/23 Cefepime >>  2/23 Vancomycin >>   Dose adjustments this admission:   Microbiology results: Pending  Plan:  - Cefepime 2 g IV q12h  - Vancomycin 2000 mg IV x 1 dose  - Followed by Vancomycin 1250 mg IV q48h  - Est Calc AUC 488 - Monitor patients renal function and urine output   Thank you for allowing pharmacy to be a part of this patient's care.  Duanne Limerick PharmD. BCPS  09/17/2019 8:40 PM

## 2019-09-17 NOTE — ED Notes (Signed)
Report attempted to 6E, unsuccessful.

## 2019-09-17 NOTE — ED Provider Notes (Signed)
[facility] EMERGENCY DEPARTMENT Provider Note   CSN: [id number] Arrival date & time: 09/17/19  1645    History Chief Complaint  Patient presents with   Weakness   Rash   Hypotension    [name] is a 69 y.o. female with medical history, see below who presents for evaluation of weakness and hypotension.  Patient with multiple admissions over the last few months.  Was DC'd to a SNF facility.  According to patient she was discharged from the SNF to home earlier today.  Patient states she is to unable to walk or take care of herself.  She lives on her own.  States her daughters did come when they originally discharged her however they cannot stay.  States she has had very little p.o. intake due to loss of appetite.  Also has had greater than 6 episodes of diarrhea over the last 24 hours.  Denies any melena or bright red blood per rectum.  Patient admits to a sacral ulcer and some mild SOB. Denies fever, chills, chest pain.  Has some generalized abdominal pain.  Denies any lateral leg swelling, redness or warmth.  Denies additional aggravating or alleviating factors.  Patient states she has had a rash over the last week.  Nonpruritic in nature.  Patient states she also has a red eye with purulent drainage.  No vision changes, eye pain.  History obtained from patient and past medical records.  No interpreter is used.  HPI     Past Medical History:  Diagnosis Date   Asthma    Degenerative joint disease    Diabetes mellitus    Dyspnea    GERD (gastroesophageal reflux disease)    Hyperlipidemia    no longer on medication for this   Hypertension    patient denies   Myocardial infarction ([place]) 08/2018   Neuropathy    bilateral hands and feet   Sickle cell trait ([place])    Sleep apnea    stomach ca dx'd 2010   chemo/xrt comp 05/2009   TIA (transient ischemic attack) 07/2015    Patient Active Problem List   Diagnosis Date Noted   Sepsis due to  gram-negative UTI ([address]) 09/04/2019   Urinary tract infection due to extended-spectrum beta lactamase (ESBL) producing Escherichia coli 09/04/2019   Acute lower UTI 09/02/2019   History of DVT (deep vein thrombosis) 09/02/2019   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/18/2019   FTT (failure to thrive) in adult 08/17/2019   SOB (shortness of breath) 08/16/2019   Lower extremity edema 08/16/2019   Fall 08/08/2019   Hypertension 08/08/2019   COVID-19 virus infection 07/25/2019   Elevated LFTs 07/25/2019   COVID-19 07/25/2019   Allergic contact dermatitis due to adhesives 05/23/2019   Insomnia 05/23/2019   SIRS (systemic inflammatory response syndrome) ([place]) 05/14/2019   NSTEMI (non-ST elevated myocardial infarction) ([place]) 05/14/2019   Hyperglycemia    Neutropenia (HCC)    CAD (coronary artery disease)    Obesity with serious comorbidity 04/16/2019   Gastroenteritis 03/27/2019   Falls frequently 03/27/2019   Acute gastroenteritis 03/22/2019   Hematoma of scalp 03/14/2019   Vitamin D deficiency 11/09/2018   Muscle spasm 10/19/2018   Chest pain 09/07/2018   Palpitations 09/07/2018   Other fatigue 09/07/2018   Acute ST elevation myocardial infarction (STEMI) of inferior wall ([place]) 08/27/2018    Class: Hospitalized for   Atherosclerotic heart disease of native coronary artery with other forms of angina pectoris ([place]) 08/27/2018    Class: Diagnosis  of   Acute vaginitis 08/20/2018   Hyperlipidemia associated with type 2 diabetes mellitus ([place]) 04/09/2018   Bronchitis 11/02/2017   Sepsis ([place]) 11/02/2017   Leucocytosis 11/02/2017   Moderate persistent asthma with acute exacerbation 05/26/2017   Influenza A 05/26/2017   Acute pain of right shoulder 03/07/2017   Acute bilateral low back pain without sciatica 03/07/2017   Chronic pain 03/07/2017   Fatty liver 03/24/2016   Weakness 03/23/2016   Unexplained weight loss 03/23/2016   Melena  03/23/2016   Hypotension 02/03/2016   Syncope 02/03/2016   Hypomagnesemia 02/03/2016   Acute renal failure superimposed on stage 3 chronic kidney disease ([place]) 02/03/2016   Gastrointestinal dysmotility 01/20/2016   CKD (chronic kidney disease) stage 3, GFR 30-59 ml/min (HCC) 01/20/2016   Abscess of right thigh 01/20/2016   Chronic pain syndrome 01/15/2016   Diarrhea 01/15/2016   Generalized abdominal pain 01/14/2016   Type 2 diabetes with nephropathy ([place]) 12/30/2015   TIA (transient ischemic attack) 07/2015   Morbid obesity due to excess calories ([place]) 05/05/2015   Diabetic polyneuropathy associated with type 2 diabetes mellitus ([place]) 01/02/2015   Acute bacterial sinusitis 01/02/2015   Onychomycosis 11/04/2014   Pain in lower limb 11/04/2014   Multinodular goiter (nontoxic) 10/16/2014   Iron deficiency anemia 05/20/2014   Major depressive disorder, single episode, moderate ([place]) 05/20/2014   Dizziness of unknown cause 04/28/2014   MDD (major depressive disorder), recurrent episode, moderate ([place]) 01/28/2014   GERD (gastroesophageal reflux disease) 01/02/2014   Nausea 10/15/2012   Stomach cancer ([place]) 12/10/2008   CHRONIC RESPIRATORY FAILURE 01/03/2008   Hyperlipidemia 06/27/2007   OBESITY, MORBID 06/27/2007   SLEEP APNEA, OBSTRUCTIVE 06/27/2007   Generalized idiopathic epilepsy and epileptic syndromes, not intractable, with status epilepticus ([place]) 06/27/2007   Asthma 06/27/2007   OSTEOARTHRITIS  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 06/27/2007    Past Surgical History:  Procedure Laterality Date   APPENDECTOMY     CESAREAN SECTION     COLON SURGERY     colonscopy   COLONOSCOPY WITH PROPOFOL N/A 06/01/2016   Procedure: COLONOSCOPY WITH PROPOFOL;  Surgeon: Wilford Corner, MD;  Location: [facility] ENDOSCOPY;  Service: Endoscopy;  Laterality: N/A;   CORONARY STENT INTERVENTION N/A 08/29/2018   Procedure: CORONARY STENT INTERVENTION;  Surgeon: Wellington Hampshire, MD;  Location: [facility] CV LAB;  Service: Cardiovascular;  Laterality: N/A;   CORONARY/GRAFT ACUTE MI REVASCULARIZATION N/A 08/27/2018   Procedure: Coronary/Graft Acute MI Revascularization;  Surgeon: Leonie Man, MD;  Location: [facility] CV LAB;  Service: Cardiovascular;  Laterality: N/A;   ESOPHAGOGASTRODUODENOSCOPY N/A 10/15/2012   Procedure: ESOPHAGOGASTRODUODENOSCOPY (EGD);  Surgeon: Lear Ng, MD;  Location: [facility] ENDOSCOPY;  Service: Endoscopy;  Laterality: N/A;   ESOPHAGOGASTRODUODENOSCOPY N/A 01/15/2016   Procedure: ESOPHAGOGASTRODUODENOSCOPY (EGD);  Surgeon: Ronald Lobo, MD;  Location: [facility] ENDOSCOPY;  Service: Endoscopy;  Laterality: N/A;   ESOPHAGOGASTRODUODENOSCOPY (EGD) WITH PROPOFOL N/A 09/04/2019   Procedure: ESOPHAGOGASTRODUODENOSCOPY (EGD) WITH PROPOFOL;  Surgeon: Ronald Lobo, MD;  Location: [facility];  Service: Endoscopy;  Laterality: N/A;  possible esophageal dilatation   HERNIA REPAIR     LEFT HEART CATH AND CORONARY ANGIOGRAPHY N/A 08/27/2018   Procedure: LEFT HEART CATH AND CORONARY ANGIOGRAPHY;  Surgeon: Leonie Man, MD;  Location: [facility] CV LAB;  Service: Cardiovascular;  Laterality: N/A;   SHOULDER SURGERY     Left   TOTAL HIP ARTHROPLASTY     Right   TOTAL KNEE ARTHROPLASTY     Left  OB History    Gravida  4   Para  4   Term      Preterm      AB      Living  4     SAB      TAB      Ectopic      Multiple      Live Births              Family History  Problem Relation Age of Onset   Diabetes Brother    Alzheimer's disease Father 26   Cancer Mother 45   Diabetes Maternal Grandmother    Cancer Maternal Grandfather        lung   Healthy Child    Suicidality Neg Hx    Depression Neg Hx    Dementia Neg Hx    Anxiety disorder Neg Hx     Social History   Tobacco Use   Smoking status: Former Smoker    Packs/day: 1.00    Years: 15.00    Pack years: 15.00    Types:  Cigarettes    Start date: 09/27/1973    Quit date: 10/15/1988    Years since quitting: 30.9   Smokeless tobacco: Never Used   Tobacco comment: quit smoking 14 years ago  Substance Use Topics   Alcohol use: No    Alcohol/week: 0.0 standard drinks   Drug use: No    Home Medications Prior to Admission medications   Medication Sig Start Date End Date Taking? Authorizing Provider  albuterol (PROVENTIL HFA;VENTOLIN HFA) 108 (90 Base) MCG/ACT inhaler Inhale 2 puffs into the lungs every 6 (six) hours as needed for wheezing or shortness of breath. 11/05/17   Roxan Hockey, MD  albuterol (PROVENTIL) (2.5 MG/3ML) 0.083% nebulizer solution Take 3 mLs (2.5 mg total) by nebulization every 6 (six) hours as needed for wheezing or shortness of breath. 11/05/17   Roxan Hockey, MD  apixaban (ELIQUIS) 5 MG TABS tablet Take 1 tablet (5 mg total) by mouth 2 (two) times daily. 08/19/19   Bonnielee Haff, MD  clopidogrel (PLAVIX) 75 MG tablet Take 1 tablet (75 mg total) by mouth daily. 05/13/19   Black, Lezlie Octave, NP  darifenacin (ENABLEX) 7.5 MG 24 hr tablet Take 7.5 mg by mouth daily. 07/23/19   [provider]  feeding supplement, GLUCERNA SHAKE, (GLUCERNA SHAKE) LIQD Take 237 mLs by mouth 3 (three) times daily between meals. 08/14/19   Swayze, Ava, DO  gabapentin (NEURONTIN) 400 MG capsule Take 1 capsule (400 mg total) by mouth 3 (three) times daily. 08/14/19   Swayze, Ava, DO  glucose blood (ONETOUCH VERIO) test strip Use as instructed to check blood sugar 3 times a day 09/04/18   Philemon Kingdom, MD  lidocaine-prilocaine (EMLA) cream Apply 1 application topically as needed. Patient taking differently: Apply 1 application topically as needed (to numb port site.).  02/28/19   Volanda Napoleon, MD  metFORMIN (GLUCOPHAGE) 500 MG tablet Take 1 tablet (500 mg total) by mouth 2 (two) times daily with a meal. 02/13/19   Philemon Kingdom, MD  metoCLOPramide (REGLAN) 5 MG tablet Take 1 tablet (5 mg total) by  mouth every 8 (eight) hours as needed for nausea (before meals as needed for nausea.). 08/23/19   Arrien, Jimmy Picket, MD  metoprolol tartrate (LOPRESSOR) 25 MG tablet Take 0.5 tablets (12.5 mg total) by mouth 2 (two) times daily. 08/14/19   Swayze, Ava, DO  Multiple Vitamins-Minerals (CENTRUM SILVER ADULT 50+) TABS Take  1 tablet once a day Patient taking differently: Take 1 tablet by mouth daily. Take 1 tablet once a day 02/20/19   Carollee Herter, Alferd Apa, DO  nitroGLYCERIN (NITROSTAT) 0.4 MG SL tablet Place 1 tablet (0.4 mg total) under the tongue every 5 (five) minutes as needed. Patient taking differently: Place 0.4 mg under the tongue every 5 (five) minutes as needed for chest pain.  01/14/19   Lorretta Harp, MD  nystatin (MYCOSTATIN/NYSTOP) powder Apply topically 3 (three) times daily. 08/14/19   Swayze, Ava, DO  pantoprazole (PROTONIX) 40 MG tablet Take 1 tablet (40 mg total) by mouth daily. 07/16/19   Ann Held, DO  rosuvastatin (CRESTOR) 20 MG tablet Take 1 tablet (20 mg total) by mouth daily at 6 PM. 08/14/19   Swayze, Ava, DO  sertraline (ZOLOFT) 50 MG tablet Take 1 tablet (50 mg total) by mouth daily. 02/20/19   Roma Schanz R, DO  sucralfate (CARAFATE) 1 g tablet TAKE 1 TABLET BY MOUTH TWICE DAILY Patient taking differently: Take 1 g by mouth 2 (two) times daily.  02/05/19   [place], Holli Humbles, NP  traZODone (DESYREL) 50 MG tablet Take 0.5 tablets (25 mg total) by mouth at bedtime as needed for sleep. 08/14/19   Swayze, Ava, DO    Allergies    Cephalexin, Adhesive [tape], Codeine, and Zocor [simvastatin - high dose]  Review of Systems   Review of Systems  Constitutional: Positive for activity change, appetite change and fatigue.  Respiratory: Positive for shortness of breath. Negative for apnea, cough, choking, chest tightness, wheezing and stridor.   Cardiovascular: Negative.   Gastrointestinal: Negative.   Genitourinary: Negative.   Musculoskeletal: Positive  for back pain.  Skin: Positive for rash.  Neurological: Positive for weakness.  All other systems reviewed and are negative.   Physical Exam Updated Vital Signs BP 102/77    Pulse (!) 102    Temp 98.8 F (37.1 C) (Oral)    Resp 12    Ht 5' 6"  (1.676 m)    Wt 95 kg    SpO2 98%    BMI 33.80 kg/m   Physical Exam Vitals and nursing note reviewed.  Constitutional:      General: She is not in acute distress.    Appearance: She is well-developed. She is obese. She is ill-appearing (Chronically ill appearing).  HENT:     Head: Normocephalic.     Nose: Nose normal.     Mouth/Throat:     Mouth: Mucous membranes are dry.  Eyes:     Extraocular Movements: Extraocular movements intact.     Pupils: Pupils are equal, round, and reactive to light.     Comments: Purulent dc to conjunctiva.  Cardiovascular:     Rate and Rhythm: Normal rate.     Pulses: Normal pulses.     Heart sounds: Normal heart sounds.  Pulmonary:     Effort: Pulmonary effort is normal. No respiratory distress.     Breath sounds: Normal breath sounds.  Abdominal:     General: Bowel sounds are normal. There is no distension.  Musculoskeletal:        General: Normal range of motion.     Cervical back: Normal range of motion and neck supple. No rigidity or tenderness.  Skin:    General: Skin is warm and dry.     Comments: Diffuse rash to body. Stage 3 sacral ulcer. Skin breakdown to bil inguinal region  Neurological:     Mental  Status: She is alert.    ED Results / Procedures / Treatments   Labs (all labs ordered are listed, but only abnormal results are displayed) Labs Reviewed  COMPREHENSIVE METABOLIC PANEL - Abnormal; Notable for the following components:      Result Value   Potassium 5.3 (*)    CO2 20 (*)    Creatinine, Ser 1.88 (*)    Calcium 8.3 (*)    Total Protein 5.0 (*)    Albumin 1.4 (*)    AST 64 (*)    Alkaline Phosphatase 190 (*)    GFR calc non Af Amer 27 (*)    GFR calc Af Amer 31 (*)    All  other components within normal limits  LIPASE, BLOOD - Abnormal; Notable for the following components:   Lipase 10 (*)    All other components within normal limits  BRAIN NATRIURETIC PEPTIDE - Abnormal; Notable for the following components:   B Natriuretic Peptide 138.0 (*)    All other components within normal limits  LACTIC ACID, PLASMA - Abnormal; Notable for the following components:   Lactic Acid, Venous 4.5 (*)    All other components within normal limits  LACTIC ACID, PLASMA - Abnormal; Notable for the following components:   Lactic Acid, Venous 4.0 (*)    All other components within normal limits  CBC - Abnormal; Notable for the following components:   WBC 10.9 (*)    RBC 3.62 (*)    Hemoglobin 10.8 (*)    HCT 31.3 (*)    RDW 17.4 (*)    All other components within normal limits  CBG MONITORING, ED - Abnormal; Notable for the following components:   Glucose-Capillary 68 (*)    All other components within normal limits  CBG MONITORING, ED - Abnormal; Notable for the following components:   Glucose-Capillary 55 (*)    All other components within normal limits  TROPONIN I (HIGH SENSITIVITY) - Abnormal; Notable for the following components:   Troponin I (High Sensitivity) 19 (*)    All other components within normal limits  URINE CULTURE  CULTURE, BLOOD (ROUTINE X 2)  CULTURE, BLOOD (ROUTINE X 2)  SARS CORONAVIRUS 2 (TAT 6-24 HRS)  URINALYSIS, ROUTINE W REFLEX MICROSCOPIC  CBG MONITORING, ED  TROPONIN I (HIGH SENSITIVITY)    EKG None  Radiology DG Chest Portable 1 View  Result Date: 09/17/2019 CLINICAL DATA:  69 year old female with hypotension EXAM: PORTABLE CHEST 1 VIEW COMPARISON:  Chest radiograph dated 09/02/2019. FINDINGS: Right-sided PICC with tip over central SVC in similar position. New small left pleural effusion and left lung base atelectasis. Infiltrate is not excluded. Clinical correlation is recommended. The right lung remains clear. No pneumothorax.  Stable cardiomediastinal silhouette. No acute osseous pathology. IMPRESSION: Small left pleural effusion and left lung base atelectasis or infiltrate. Clinical correlation is recommended. No other interval change. Electronically Signed   By: Anner Crete M.D.   On: 09/17/2019 17:44   Procedures .Critical Care Performed by: Nettie Elm, PA-C Authorized by: Nettie Elm, PA-C   Critical care provider statement:    Critical care time (minutes):  45   Critical care was necessary to treat or prevent imminent or life-threatening deterioration of the following conditions:  Circulatory failure, dehydration and sepsis   Critical care was time spent personally by me on the following activities:  Discussions with consultants, evaluation of patient's response to treatment, examination of patient, ordering and performing treatments and interventions, ordering and review of laboratory  studies, ordering and review of radiographic studies, pulse oximetry, re-evaluation of patient's condition, obtaining history from patient or surrogate and review of old charts   (including critical care time)  Medications Ordered in ED Medications  metroNIDAZOLE (FLAGYL) IVPB 500 mg (500 mg Intravenous New Bag/Given 09/17/19 2036)  0.9 %  sodium chloride infusion (has no administration in time range)  vancomycin (VANCOREADY) IVPB 2000 mg/400 mL (has no administration in time range)    Followed by  vancomycin (VANCOREADY) IVPB 1250 mg/250 mL (has no administration in time range)  ceFEPIme (MAXIPIME) 2 g in sodium chloride 0.9 % 100 mL IVPB (2 g Intravenous New Bag/Given 09/17/19 2040)  sodium chloride flush (NS) 0.9 % injection 3 mL (3 mLs Intravenous Given 09/17/19 1829)  sodium chloride 0.9 % bolus 1,000 mL (0 mLs Intravenous Stopped 09/17/19 1924)  sodium chloride 0.9 % bolus 1,000 mL (1,000 mLs Intravenous New Bag/Given 09/17/19 1924)   ED Course  I have reviewed the triage vital signs and the nursing  notes.  Pertinent labs & imaging results that were available during my care of the patient were reviewed by me and considered in my medical decision making (see chart for details).  69 year old female chronically ill-appearing presents for evaluation of generalized weakness and hypotension.  Patient recently discharged from hospital and was released from SNF facility today.  Patient states she feels too weak to take care of herself at home.  She denies any complaints except for a rash and pain to a sacral ulcer.  No urinary symptoms, chest pain, shortness of breath, abdomen soft.  On arrival patient hypotensive with systolics into the 94B however according to prior records patient baseline with systolic into the 09G.  She has not been taking her home blood pressure medicines at SNF facility due to her hypotension.  Patient states she gets dizzy when she goes from sitting to standing and is unable to do so due to weakness.  Code sepsis not called as low suspicion for sepsis at this time given her history from prior admission notes.  Will obtain labs, IVF and reevaluate.    Clinical Course as of Sep 17 2107  Tue Sep 17, 2019  2026 Small left pleural effusion however no infiltrates  DG Chest Portable 1 View [BH]  2026 Leukocytosis at 10.9, previous 10.4  CBC(!) [BH]  2026 Hyperkalemia at 5.3 however no EKG changes.  Creatinine 1.88 at baseline  Comprehensive metabolic panel(!) [BH]  2836 10  Lipase, blood(!) [BH]  2027 Delta negative  Troponin I (High Sensitivity) [BH]  2027 Elevated however downtrending from previous.  Low suspicion for sepsis  Lactic acid, plasma(!!) [BH]    Clinical Course User Index [BH] Argyle Gustafson A, PA-C    Patient reassessed.  She continues to be hypotensive however maps greater than 65.  Low suspicion for sepsis, highly suspect more dehydration however will give broad-spectrum antibiotics given recent ESBL UTI as well as sacral wounds.  She does have a rash  consistent with excoriations. No blisters, no pustules, no warmth, no draining sinus tracts, no superficial abscesses, no bullous impetigo, no vesicles, no desquamation, no target lesions with dusky purpura or a central bulla. Not tender to touch. No concern for superimposed infection. No concern for SJS, TEN, TSS, tick borne illness, syphilis or other life-threatening condition.   Nursing went to assist patient to edge of bed however patient became lightheaded, dizzy and hypotensive with systolic to the 62H.  She was laid back down.  She is  ready received 2 L of fluids.  We will put her on small continuous fluids and admit for persistent hypotension.  On prior admission her blood cultures were negative and we have repeated these today.  She did have Covid 2 months ago.  Do not think we need to repeat testing at this time.  CONSULT with Dr. Hal Hope with [facility] who will evaluate patient for admission.  The patient appears reasonably stabilized for admission considering the current resources, flow, and capabilities available in the ED at this time, and I doubt any other [facility] requiring further screening and/or treatment in the ED prior to admission.  Patient seen evaluated by attending physician, Dr. Laverta Baltimore who agrees with the treatment, plan and disposition.  MDM Rules/Calculators/A&P                       Final Clinical Impression(s) / ED Diagnoses Final diagnoses:  Hypotension, unspecified hypotension type  Elevated lactic acid level  Skin ulcer of sacrum with fat layer exposed ([place])  SIRS (systemic inflammatory response syndrome) ([place])    Rx / DC Orders ED Discharge Orders    None       Ruben Mahler A, PA-C 09/17/19 2109    Margette Fast, MD 09/18/19 1437

## 2019-09-18 ENCOUNTER — Ambulatory Visit: Payer: Medicare HMO | Admitting: Family Medicine

## 2019-09-18 DIAGNOSIS — I959 Hypotension, unspecified: Secondary | ICD-10-CM | POA: Diagnosis not present

## 2019-09-18 DIAGNOSIS — L8990 Pressure ulcer of unspecified site, unspecified stage: Secondary | ICD-10-CM | POA: Insufficient documentation

## 2019-09-18 DIAGNOSIS — I9589 Other hypotension: Secondary | ICD-10-CM

## 2019-09-18 DIAGNOSIS — R519 Headache, unspecified: Secondary | ICD-10-CM | POA: Diagnosis not present

## 2019-09-18 DIAGNOSIS — N183 Chronic kidney disease, stage 3 unspecified: Secondary | ICD-10-CM | POA: Diagnosis not present

## 2019-09-18 DIAGNOSIS — E861 Hypovolemia: Secondary | ICD-10-CM

## 2019-09-18 DIAGNOSIS — N179 Acute kidney failure, unspecified: Secondary | ICD-10-CM | POA: Diagnosis not present

## 2019-09-18 DIAGNOSIS — R270 Ataxia, unspecified: Secondary | ICD-10-CM | POA: Diagnosis not present

## 2019-09-18 LAB — URINALYSIS, ROUTINE W REFLEX MICROSCOPIC
Bilirubin Urine: NEGATIVE
Glucose, UA: NEGATIVE mg/dL
Hgb urine dipstick: NEGATIVE
Ketones, ur: NEGATIVE mg/dL
Nitrite: NEGATIVE
Protein, ur: NEGATIVE mg/dL
Specific Gravity, Urine: 1.016 (ref 1.005–1.030)
pH: 5 (ref 5.0–8.0)

## 2019-09-18 LAB — GLUCOSE, CAPILLARY
Glucose-Capillary: 60 mg/dL — ABNORMAL LOW (ref 70–99)
Glucose-Capillary: 61 mg/dL — ABNORMAL LOW (ref 70–99)
Glucose-Capillary: 111 mg/dL — ABNORMAL HIGH (ref 70–99)
Glucose-Capillary: 58 mg/dL — ABNORMAL LOW (ref 70–99)
Glucose-Capillary: 113 mg/dL — ABNORMAL HIGH (ref 70–99)
Glucose-Capillary: 125 mg/dL — ABNORMAL HIGH (ref 70–99)

## 2019-09-18 LAB — BASIC METABOLIC PANEL
Anion gap: 8 (ref 5–15)
BUN: 11 mg/dL (ref 8–23)
CO2: 19 mmol/L — ABNORMAL LOW (ref 22–32)
Calcium: 7 mg/dL — ABNORMAL LOW (ref 8.9–10.3)
Chloride: 113 mmol/L — ABNORMAL HIGH (ref 98–111)
Creatinine, Ser: 1.64 mg/dL — ABNORMAL HIGH (ref 0.44–1.00)
GFR calc Af Amer: 37 mL/min — ABNORMAL LOW (ref >60)
GFR calc non Af Amer: 32 mL/min — ABNORMAL LOW (ref >60)
Glucose, Bld: 118 mg/dL — ABNORMAL HIGH (ref 70–99)
Potassium: 4.9 mmol/L (ref 3.5–5.1)
Sodium: 140 mmol/L (ref 135–145)

## 2019-09-18 LAB — SARS CORONAVIRUS 2 (TAT 6-24 HRS): SARS Coronavirus 2: NEGATIVE

## 2019-09-18 LAB — CORTISOL: Cortisol, Plasma: 15.7 ug/dL

## 2019-09-18 LAB — LACTIC ACID, PLASMA
Lactic Acid, Venous: 2.8 mmol/L (ref 0.5–1.9)
Lactic Acid, Venous: 1.7 mmol/L (ref 0.5–1.9)

## 2019-09-18 LAB — PROCALCITONIN: Procalcitonin: 1.21 ng/mL

## 2019-09-18 MED ORDER — GABAPENTIN 100 MG PO CAPS: 100.0000 mg | ORAL_CAPSULE | Freq: Three times a day (TID) | ORAL | Status: DC

## 2019-09-18 MED ORDER — HYDROCORTISONE NA SUCCINATE PF 100 MG IJ SOLR
50.0000 mg | Freq: Four times a day (QID) | INTRAMUSCULAR | Status: DC
  Administered 2019-09-18 – 2019-09-20 (×8): 50 mg via INTRAVENOUS
  Filled 2019-09-18 (×8): qty 2

## 2019-09-18 MED ORDER — GLUCOSE 40 % PO GEL
ORAL | Status: AC
  Filled 2019-09-18: qty 1

## 2019-09-18 MED ORDER — DEXTROSE 50 % IV SOLN
INTRAVENOUS | Status: AC
  Filled 2019-09-18: qty 50

## 2019-09-18 MED ORDER — METRONIDAZOLE IN NACL 5-0.79 MG/ML-% IV SOLN: 500.0000 mg | Freq: Three times a day (TID) | INTRAVENOUS | Status: DC

## 2019-09-18 MED ORDER — PHENYLEPHRINE HCL-NACL 10-0.9 MG/250ML-% IV SOLN
0.0000 ug/min | INTRAVENOUS | Status: DC
  Administered 2019-09-18: 15 ug/min via INTRAVENOUS
  Administered 2019-09-19: 17 ug/min via INTRAVENOUS
  Filled 2019-09-18 (×3): qty 250

## 2019-09-18 MED ORDER — SODIUM CHLORIDE 0.9 % IV SOLN: 2.0000 g | Freq: Two times a day (BID) | INTRAVENOUS | Status: DC

## 2019-09-18 MED ORDER — SODIUM CHLORIDE 0.9 % IV BOLUS
1000.0000 mL | Freq: Once | INTRAVENOUS | Status: AC
  Administered 2019-09-18: 1000 mL via INTRAVENOUS

## 2019-09-18 MED ORDER — SODIUM CHLORIDE 0.9 % IV SOLN
1.0000 g | Freq: Two times a day (BID) | INTRAVENOUS | Status: DC
  Administered 2019-09-18 – 2019-09-20 (×5): 1 g via INTRAVENOUS
  Filled 2019-09-18 (×8): qty 1

## 2019-09-18 MED ORDER — MIDODRINE HCL 5 MG PO TABS
10.0000 mg | ORAL_TABLET | Freq: Three times a day (TID) | ORAL | Status: DC
  Administered 2019-09-19 – 2019-09-27 (×25): 10 mg via ORAL
  Filled 2019-09-18 (×25): qty 2

## 2019-09-18 MED ORDER — BACITRACIN-POLYMYXIN B 500-10000 UNIT/GM OP OINT
TOPICAL_OINTMENT | Freq: Three times a day (TID) | OPHTHALMIC | Status: DC
  Administered 2019-09-18: 1 via OPHTHALMIC
  Filled 2019-09-18 (×5): qty 3.5

## 2019-09-18 MED ORDER — CHLORHEXIDINE GLUCONATE CLOTH 2 % EX PADS
6.0000 | MEDICATED_PAD | Freq: Every day | CUTANEOUS | Status: DC
  Administered 2019-09-18 – 2019-09-27 (×10): 6 via TOPICAL

## 2019-09-18 MED ORDER — OFLOXACIN 0.3 % OP SOLN
1.0000 [drp] | Freq: Four times a day (QID) | OPHTHALMIC | Status: DC
  Administered 2019-09-18: 1 [drp] via OPHTHALMIC
  Filled 2019-09-18: qty 5

## 2019-09-18 MED ORDER — DEXTROSE 50 % IV SOLN
12.5000 g | INTRAVENOUS | Status: AC
  Administered 2019-09-18: 12.5 g via INTRAVENOUS

## 2019-09-18 MED ORDER — TRAMADOL HCL 50 MG PO TABS
50.0000 mg | ORAL_TABLET | Freq: Four times a day (QID) | ORAL | Status: DC | PRN
  Administered 2019-09-19 – 2019-09-25 (×10): 50 mg via ORAL
  Filled 2019-09-18 (×11): qty 1

## 2019-09-18 MED ORDER — STERILE WATER FOR INJECTION IV SOLN
INTRAVENOUS | Status: DC
  Filled 2019-09-18 (×2): qty 850

## 2019-09-18 MED ORDER — ENSURE ENLIVE PO LIQD
237.0000 mL | Freq: Three times a day (TID) | ORAL | Status: DC
  Administered 2019-09-18 – 2019-09-27 (×15): 237 mL via ORAL
  Filled 2019-09-18: qty 237

## 2019-09-18 MED ORDER — SODIUM CHLORIDE 0.9 % IV SOLN
250.0000 mL | INTRAVENOUS | Status: DC
  Administered 2019-09-18 – 2019-09-22 (×2): 250 mL via INTRAVENOUS

## 2019-09-18 MED ORDER — SODIUM CHLORIDE 0.9 % IV SOLN: INTRAVENOUS | Status: DC

## 2019-09-18 NOTE — Progress Notes (Signed)
Patient's manual BP 72/42 after receiving a little over 2L fluid today. Rapid Respone RN, MD and critical care NP notified.  Patient has not voided in 6 hours, bladder scan showed 193. Patient is lethargic but rouses easily.  Orders to transfer to 4N ICU.

## 2019-09-18 NOTE — Significant Event (Signed)
Patient's blood pressure did not improve with IV fluids.  I reached out to PCCM again.  Discussed with Dr. Ruthann Cancer.  Patient will be started on pressors through peripheral line for now and will be moved to ICU under ICU service

## 2019-09-18 NOTE — Significant Event (Signed)
Rapid Response Event Note  Overview: Time Called: 0254 Arrival Time: 1650 Event Type: Hypotension Pt has been having soft blood pressure throughout the day and has received a total of 3L IVF bolus. BP now 72/42, manual. HR 110, RR 19, SpO2 94% on room air.  Initial Focused Assessment: Pt awake, alert lying in bed. Pt follows commands and moves all extremities. PERRLA. Grips equal.   Interventions: -Transfer to ICU  Event Summary: RN spoke with Dr. Tawanna Solo prior to my arrival regarding manual BP of 72/42, MD recommended RN notify PCCM. Decision made to transfer to ICU.  Rapid response called away to an emergency. RN to pack pt's belongings and prepare for transfer. Transfer pre assigned to 4N-ICU, awaiting bed approval. RN to call rapid response if assistance is needed prior to transfer.   Outcome: Transferred (Comment)(Transferred to ICU)  Event End Time: [place]

## 2019-09-18 NOTE — Progress Notes (Signed)
Patient's manual BP 88/58, MD notified. Patient alert and asymptomatic. Orders for 1L bolus. Will continue to monitor.

## 2019-09-18 NOTE — Consult Note (Signed)
   [facility] Inpatient Consult   09/18/2019  [name] 19-Nov-1950 [id number]    THN: ACTIVE status:   Alerted by [facility]) care management coordinator (NP) of patient's admission.  Patient is currently active with [facility]) Care Management services. She is engaged by [facility] care management coordinator (NP) for follow up on falls and diabetes management.  Patientwas checked for 52%extreme high riskscorefor unplanned readmission with 7 hospitalizations and an ED visit in the past 6 monthsunder her [place].  Review of medical record shows on MDbrief narrative as:   69 year old female with history of type 2 diabetes mellitus, coronary artery disease status post stenting, recently diagnosed DVT, was admitted for Covid about 2 months ago and was recently discharged from rehab to home,   presented to the emergency department with complaints of increased weakness, rash around her right eye. On presentation, she was found to be hypotensive with systolic blood pressure in the range of 70s, lactic acid of 4.  Chest x-ray shows pleural effusion with possible infiltrate.  Found to have AKI.  She also had sacral decubitus stage II ulcer with no signs of active infection.    Patient was admitted for the suspicion of possible sepsis. Started on broad-spectrum antibiotics.  Her Primary Care Provideris Carollee Herter, Alferd Apa, DO with, [facility], listed as providing transition of care follow-up.  Patient will receive a post hospital call and will be evaluated for assessments and disease process education if transitioning to home.  Plan: Will follow along for progress and needs for disposition with inpatient transition of care team and make aware that [facility] CM following. WillupdateTriad IT sales professional (NP)of discharge disposition.  Of note, [facility] Care Management services does not replace or  interfere with any services that are arranged by inpatient case management or social work.     Noted patient for transfer to 4N ICU for further management (hypotension).   For questions oradditional information,please contact:  Tracer Gutridge A. Bobette Leyh, BSN, RN-BC [facility] Liaison Cell: [phone]

## 2019-09-18 NOTE — Progress Notes (Addendum)
Pharmacy Antibiotic Note  [name] is a 69 y.o. female admitted on 09/17/2019 with sepsis.  Pharmacy has been consulted for Meropenem. Pt also on Vancomycin. Noted pt with h/o ESBL Ecoli in urine earlier this month.  Plan: Meropenem 1gm IV q12h Continue Vancomycin 1250 mg IV q48h  Est Calc AUC 488 Monitor patients renal function and urine output   Height: 5' 6"  (167.6 cm) Weight: 209 lb 7 oz (95 kg) IBW/kg (Calculated) : 59.3  Temp (24hrs), Avg:98.5 F (36.9 C), Min:98.2 F (36.8 C), Max:98.8 F (37.1 C)  Recent Labs  Lab 09/17/19 1725 09/17/19 1905 09/18/19 0040 09/18/19 0359  WBC 10.9*  --   --   --   CREATININE 1.88*  --   --   --   LATICACIDVEN 4.5* 4.0* 2.8* 1.7    Estimated Creatinine Clearance: 33.7 mL/min (A) (by C-G formula based on SCr of 1.88 mg/dL (H)).    Allergies  Allergen Reactions  . Cephalexin Itching, Rash and Other (See Comments)    Sensation of throat closing  . Adhesive [Tape] Other (See Comments)    Burn Skin  . Codeine Other (See Comments)    paranoid  . Zocor [Simvastatin - High Dose] Other (See Comments)    Muscle spasms    Antimicrobials this admission: 2/23 Cefepime >> 2/24 2/23 Vancomycin >>  2/24 Meropenem  Microbiology results: 2/23 BCx:   UCx:     Thank you for allowing pharmacy to be a part of this patient's care.  Sherlon Handing, PharmD, BCPS Please see amion for complete clinical pharmacist phone list 09/18/2019 6:20 AM

## 2019-09-18 NOTE — Congregational Nurse Program (Signed)
[facility] Nurse Consult Note: Patient receiving care in [place].  Consult completed remotely after review of record. At the current time, the FYIS box contains the following statement:  "DETECTED/POSITIVE/PRESUMPTIVE POSITIVE NOVEL CORONAVIRUS (2019-NCOV)". Reason for Consult: "stage 2 sacral wound, MASD groin" Wound type: as above Pressure Injury POA: Yes/No/NA Measurement: To be provided by the bedside RN in the flowsheet section Wound bed: Drainage (amount, consistency, odor)  Periwound: Dressing procedure/placement/frequency: Using the Skin Care Order Set available to all licensed nurses, I have initiated a foam dressing for the sacral stage 2 wound, and InterDry textile for the MASD of the groin. Monitor the wound area(s) for worsening of condition such as: Signs/symptoms of infection,  Increase in size,  Development of or worsening of odor, Development of pain, or increased pain at the affected locations.  Notify the medical team if any of these develop.  Thank you for the consult. [place] nurse will not follow at this time.  Please re-consult the [place] team if needed.  Val Riles, RN, MSN, CWOCN, CNS-BC, pager [phone]

## 2019-09-18 NOTE — Consult Note (Addendum)
NAME:  [name], MRN:  [id number], DOB:  1950/10/27, LOS: 1 ADMISSION DATE:  09/17/2019, CONSULTATION DATE:  09/18/19 REFERRING MD:  Dr. Tawanna Solo, CHIEF COMPLAINT:  Hypotension    Brief History   69 y/o F   History of present illness   69 y/o F who was admitted on 2/23 with reports of weakness and fatigue.    The patient was admitted in 04/2019 for chest pain (medical management recommended) and noted to have significant diarrhea.  She tested positive for enteropathogenic E-Coli by GI PCR.  It does not appear that she had GI follow up at that time. Her primary MD gave her hyoscyamine PRN for diarrhea.   She was hospitalized 12/31-1/5 after testing positive for COVID (did not require intubation).  That hospitalization was notable for acute DVT in the R peroneal vein and volume depletion due to dehydration / poor intake. She was started on Eliquis for DVT.  She was hospitalized again 1/14-1/21 and again 1/22 to 1/29 for poor appetite, hypotension / failure to thrive with generalized weakness and falls.  She was admitted yet again 2/8-2/12 for ESBL E.Coli UTI.    The patient lives alone. 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  Her daughter helps care for her.  She orders her medications by mail and reports she has been compliant with taking them.   She states she lost her taste/smell with COVID and has had no appetite since.  She finds it difficult to eat and has had intermittent diarrhea for weeks.  She goes to the bathroom sometimes 4 times per day. She notes decreased UOP.  She was discharged from SNF on 2/23 and her daughter brought her to the ER with reports of weakness, inability to walk, right eye swelling and fatigue. She notes she has lost approximately 30 lbs in the last month.  She denies fevers, chills, n/v, LE swelling, chest pain, pain with inspiration.  On presentation, she had hypotension with SBP in the 70's, AKI, lactic acidosis.  She was treated with approximately 4L  IVF's with modest improvement in her blood pressure.  Cortisol was borderline low at 15.  CT head negative and CT orbits negative for inflammation.  Renal function improved with fluid resuscitation, lactic acid cleared.  BP remained soft with SBP in the 80's. PCCM consulted.   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   Consults:  PCCM   Procedures:  R Chest Port-a-Cath >>  Significant Diagnostic Tests:   ECHO 09/03/19 >> LVEF 65-70%, G1DD, no RWMA, normal RV, mild MR  CT Head 2/24 >> stable head CT, no acute intracranial process  CT Orbits 2/24 >> no evidence of traumatic or inflammatory process   Micro Data:  BCx2 2/23 >>  UC 2/23 >>  UC 2/23 >>  COVID 2/23 >> negative   Antimicrobials:  Flagyl 2/23 x1 Cefepime 2/24 x1 Meropenem 2/24 >>  Vanco 2/25 >>   Interim history/subjective:  As above   Objective   Blood pressure (!) 88/58, pulse 95, temperature 98.2 F (36.8 C), temperature source Oral, resp. rate 11, height 5' 6"  (1.676  m), weight 95 kg, SpO2 96 %.        Intake/Output Summary (Last 24 hours) at 09/18/2019 1356 Last data filed at 09/18/2019 0319 Gross per 24 hour  Intake 2786.57 ml  Output --  Net 2786.57 ml   Filed Weights   09/17/19 1957  Weight: 95 kg    Examination: General: elderly female lying in bed, chronically ill appearing  HEENT: MM pink/dry, cracked lips, poor dentition, dry crusting to both eyes, right eye with darker pigmented crusting  Neuro: Awakens, alert, oriented, speech clear, pleasant, generalized weakness, moves upper extremities, has difficulty lifting legs off bed CV: s1s2 RRR, no  m/r/g PULM:  Non-labored, lungs bilaterally clear  GI: soft, bsx4 active  Extremities: warm/dry, no edema  Skin: multiple areas of broken skin, scabs on chest from prior EKG leads, multiple areas of darker pigmented rash-like dry scaling on skin   Resolved Hospital Problem list      Assessment & Plan:   Hypotension  Volume Depletion  Adrenal Insufficiency  Rule Out Sepsis  Patient has had multiple admissions since Oct 2020.  She was unfortunately positive for COVID 12/31 and lost her sense of taste / smell. She has lost ~ 30lbs and has an admit albumin of 1.4.  She has had intermittent diarrhea noted in the chart since October 2020.  Her cortisol has been borderline low (10.5, 8.5 and most recently 15.7).  She has had diarrhea as recently as 2/22 (4x that day) and poor intake, note CO2 on Bicarb of 19.  Her ECHO 2/9 showed respiratory collapse of the IVC which supports her months long history of poor intake / failure to thrive.  Suspect hypotension is multifactorial in the setting of volume depletion and relative adrenal insufficiency. She has had prior ESBL E-Coli UTI's in the past.  Must also consider component of infection, PCT 1.2.   -sodium bicarbonate at 154m/hr x20 hours -follow electrolytes closely with bicarbonate infusion  -add stress dose steroids  -defer abx to primary  -trend PCT  -follow cultures  -reduce dose of gabapentin for now  AKI  Hyperkalemia In setting of volume depletion  -Trend BMP / urinary output -Replace electrolytes as indicated -Avoid nephrotoxic agents, ensure adequate renal perfusion -hold home metformin   Diarrhea  -defer work up to primary team   Small Left Pleural Effusion with Atelectasis -monitor, if enlarging, consider sampling   Hx DVT  -continue eliquis   Anemia  -per primary   Eye Hyperpigmentation   CT negative for cellulitis -per primary   Decubitus Ulcers in the setting of Severe Protein Calorie Malnutrition and Immobility   Stage II -nutrition consult  -add ensure TID   Best practice:  Diet: as tolerated DVT prophylaxis: per primary  GI prophylaxis: per primary  Glucose control: per primary  Mobility: As tolerated  Code Status: Full Code  Family Communication: Per primary. Patient updated on plan of care.  Disposition: per primary  Labs   CBC: Recent Labs  Lab 09/17/19 1725  WBC 10.9*  HGB 10.8*  HCT 31.3*  MCV 86.5  PLT 2355   Basic Metabolic Panel: Recent Labs  Lab 09/17/19 1725 09/18/19 1057  NA 140 140  K 5.3* 4.9  CL 109 113*  CO2 20* 19*  GLUCOSE 88 118*  BUN 12 11  CREATININE 1.88* 1.64*  CALCIUM 8.3* 7.0*   GFR: Estimated Creatinine Clearance: 38.7 mL/min (A) (by C-G formula based on SCr of 1.64 mg/dL (H)). Recent Labs  Lab 09/17/19  1725 09/17/19 1905 09/18/19 0040 09/18/19 0057 09/18/19 0359  PROCALCITON  --   --   --  1.21  --   WBC 10.9*  --   --   --   --   LATICACIDVEN 4.5* 4.0* 2.8*  --  1.7    Liver Function Tests: Recent Labs  Lab 09/17/19 1725  AST 64*  ALT 26  ALKPHOS 190*  BILITOT 1.2  PROT 5.0*  ALBUMIN 1.4*   Recent Labs  Lab 09/17/19 1725  LIPASE 10*   No results for input(s): AMMONIA in the last 168 hours.  ABG    Component Value Date/Time   PHART 7.402 (H) 01/19/2009 1611   PCO2ART 42.4 01/19/2009 1611   PO2ART 91.0 01/19/2009 1611   HCO3 25.8 (H) 01/19/2009 1611   TCO2 28 12/17/2016 1539   O2SAT 97.1 01/19/2009 1611     Coagulation Profile: No results for input(s): INR, PROTIME in the last 168 hours.  Cardiac Enzymes: No results for input(s): CKTOTAL, CKMB, CKMBINDEX, TROPONINI in the last 168 hours.  HbA1C: Hemoglobin A1c  Date/Time Value Ref Range Status  06/27/2016 11:43 AM 7.1 (H) 4.8 - 5.6 % Final    Comment:             Pre-diabetes: 5.7 - 6.4          Diabetes: >6.4          Glycemic control for adults with diabetes: <7.0    Hgb A1c MFr Bld  Date/Time Value Ref Range Status  07/27/2019 02:40 AM 8.7 (H)  4.8 - 5.6 % Final    Comment:    (NOTE) Pre diabetes:          5.7%-6.4% Diabetes:              >6.4% Glycemic control for   <7.0% adults with diabetes   05/13/2019 08:07 AM 10.0 (H) 4.8 - 5.6 % Final    Comment:    (NOTE)         Prediabetes: 5.7 - 6.4         Diabetes: >6.4         Glycemic control for adults with diabetes: <7.0     CBG: Recent Labs  Lab 09/18/19 0803 09/18/19 0823 09/18/19 0842 09/18/19 0906 09/18/19 1219  GLUCAP 61* 60* 58* 111* 113*    Review of Systems: Positives in [place]   Gen: Denies fever, chills, weight change, fatigue, night sweats HEENT: Denies blurred vision, double vision, hearing loss, tinnitus, sinus congestion, rhinorrhea, sore throat, neck stiffness, dysphagia PULM: Denies shortness of breath, cough, sputum production, hemoptysis, wheezing CV: Denies chest pain, edema, orthopnea, paroxysmal nocturnal dyspnea, palpitations GI: Denies abdominal pain, nausea, vomiting, diarrhea, hematochezia, melena, constipation, change in bowel habits GU: Denies dysuria, hematuria, polyuria, oliguria, urethral discharge Endocrine: Denies hot or cold intolerance, polyuria, polyphagia or appetite change Derm: Denies rash, dry skin, scaling or peeling skin change Heme: Denies easy bruising, bleeding, bleeding gums Neuro: Denies headache, numbness, weakness, slurred speech, loss of memory or consciousness  Past Medical History  She,  has a past medical history of Asthma, Degenerative joint disease, Diabetes mellitus, Dyspnea, GERD (gastroesophageal reflux disease), Hyperlipidemia, Hypertension, Myocardial infarction ([place]) (08/2018), Neuropathy, Sickle cell trait ([place]), Sleep apnea, stomach ca (dx'd 2010), and TIA (transient ischemic attack) (07/2015).   Surgical History    Past Surgical History:  Procedure Laterality Date  . APPENDECTOMY    . CESAREAN SECTION    . COLON SURGERY     colonscopy  .  COLONOSCOPY WITH PROPOFOL N/A 06/01/2016   Procedure:  COLONOSCOPY WITH PROPOFOL;  Surgeon: Wilford Corner, MD;  Location: [facility] ENDOSCOPY;  Service: Endoscopy;  Laterality: N/A;  . CORONARY STENT INTERVENTION N/A 08/29/2018   Procedure: CORONARY STENT INTERVENTION;  Surgeon: Wellington Hampshire, MD;  Location: [place] CV LAB;  Service: Cardiovascular;  Laterality: N/A;  . CORONARY/GRAFT ACUTE MI REVASCULARIZATION N/A 08/27/2018   Procedure: Coronary/Graft Acute MI Revascularization;  Surgeon: Leonie Man, MD;  Location: [place] CV LAB;  Service: Cardiovascular;  Laterality: N/A;  . ESOPHAGOGASTRODUODENOSCOPY N/A 10/15/2012   Procedure: ESOPHAGOGASTRODUODENOSCOPY (EGD);  Surgeon: Lear Ng, MD;  Location: [facility] ENDOSCOPY;  Service: Endoscopy;  Laterality: N/A;  . ESOPHAGOGASTRODUODENOSCOPY N/A 01/15/2016   Procedure: ESOPHAGOGASTRODUODENOSCOPY (EGD);  Surgeon: Ronald Lobo, MD;  Location: [facility] ENDOSCOPY;  Service: Endoscopy;  Laterality: N/A;  . ESOPHAGOGASTRODUODENOSCOPY (EGD) WITH PROPOFOL N/A 09/04/2019   Procedure: ESOPHAGOGASTRODUODENOSCOPY (EGD) WITH PROPOFOL;  Surgeon: Ronald Lobo, MD;  Location: [facility];  Service: Endoscopy;  Laterality: N/A;  possible esophageal dilatation  . HERNIA REPAIR    . LEFT HEART CATH AND CORONARY ANGIOGRAPHY N/A 08/27/2018   Procedure: LEFT HEART CATH AND CORONARY ANGIOGRAPHY;  Surgeon: Leonie Man, MD;  Location: [facility] CV LAB;  Service: Cardiovascular;  Laterality: N/A;  . SHOULDER SURGERY     Left  . TOTAL HIP ARTHROPLASTY     Right  . TOTAL KNEE ARTHROPLASTY     Left     Social History   reports that she quit smoking about 30 years ago. Her smoking use included cigarettes. She started smoking about 46 years ago. She has a 15.00 pack-year smoking history. She has never used smokeless tobacco. She reports that she does not drink alcohol or use drugs.   Family History   Her family history includes Alzheimer's disease (age of onset: 20) in her father; Cancer in her maternal  grandfather; Cancer (age of onset: 75) in her mother; Diabetes in her brother and maternal grandmother; Healthy in her child. There is no history of Suicidality, Depression, Dementia, or Anxiety disorder.   Allergies Allergies  Allergen Reactions  . Cephalexin Itching, Rash and Other (See Comments)    Sensation of throat closing  . Adhesive [Tape] Other (See Comments)    Burn Skin  . Codeine Other (See Comments)    paranoid  . Zocor [Simvastatin - High Dose] Other (See Comments)    Muscle spasms     Home Medications  Prior to Admission medications   Medication Sig Start Date End Date Taking? Authorizing Provider  albuterol (PROVENTIL HFA;VENTOLIN HFA) 108 (90 Base) MCG/ACT inhaler Inhale 2 puffs into the lungs every 6 (six) hours as needed for wheezing or shortness of breath. 11/05/17  Yes Emokpae, Courage, MD  albuterol (PROVENTIL) (2.5 MG/3ML) 0.083% nebulizer solution Take 3 mLs (2.5 mg total) by nebulization every 6 (six) hours as needed for wheezing or shortness of breath. 11/05/17  Yes Emokpae, Courage, MD  apixaban (ELIQUIS) 5 MG TABS tablet Take 1 tablet (5 mg total) by mouth 2 (two) times daily. 08/19/19  Yes Bonnielee Haff, MD  gabapentin (NEURONTIN) 400 MG capsule Take 1 capsule (400 mg total) by mouth 3 (three) times daily. 08/14/19  Yes Swayze, Ava, DO  lidocaine-prilocaine (EMLA) cream Apply 1 application topically as needed. Patient taking differently: Apply 1 application topically as needed (to numb port site.).  02/28/19  Yes Volanda Napoleon, MD  metFORMIN (GLUCOPHAGE) 500 MG tablet Take 1 tablet (500 mg total) by mouth  2 (two) times daily with a meal. 02/13/19  Yes Philemon Kingdom, MD  metoCLOPramide (REGLAN) 5 MG tablet Take 1 tablet (5 mg total) by mouth every 8 (eight) hours as needed for nausea (before meals as needed for nausea.). 08/23/19  Yes Arrien, Jimmy Picket, MD  metoprolol tartrate (LOPRESSOR) 25 MG tablet Take 0.5 tablets (12.5 mg total) by mouth 2 (two) times  daily. 08/14/19  Yes Swayze, Ava, DO  Multiple Vitamins-Minerals (CENTRUM SILVER ADULT 50+) TABS Take 1 tablet once a day Patient taking differently: Take 1 tablet by mouth daily. Take 1 tablet once a day 02/20/19  Yes Lowne Lyndal Pulley R, DO  nitroGLYCERIN (NITROSTAT) 0.4 MG SL tablet Place 1 tablet (0.4 mg total) under the tongue every 5 (five) minutes as needed. Patient taking differently: Place 0.4 mg under the tongue every 5 (five) minutes as needed for chest pain.  01/14/19  Yes Lorretta Harp, MD  nystatin (MYCOSTATIN) 100000 UNIT/ML suspension Take 5 mLs by mouth 4 (four) times daily. For thrush x 10 days started 09-16-19   Yes [provider]  nystatin (MYCOSTATIN/NYSTOP) powder Apply topically 3 (three) times daily. 08/14/19  Yes Swayze, Ava, DO  omeprazole (PRILOSEC) 20 MG capsule Take 20 mg by mouth 2 (two) times daily before a meal.   Yes [provider]  oxybutynin (DITROPAN-XL) 5 MG 24 hr tablet Take 5 mg by mouth daily.   Yes [provider]  rosuvastatin (CRESTOR) 20 MG tablet Take 1 tablet (20 mg total) by mouth daily at 6 PM. 08/14/19  Yes Swayze, Ava, DO  sertraline (ZOLOFT) 50 MG tablet Take 1 tablet (50 mg total) by mouth daily. 02/20/19  Yes Ann Held, DO  traZODone (DESYREL) 50 MG tablet Take 0.5 tablets (25 mg total) by mouth at bedtime as needed for sleep. 08/14/19  Yes Swayze, Ava, DO  clopidogrel (PLAVIX) 75 MG tablet Take 1 tablet (75 mg total) by mouth daily. Patient not taking: Reported on 09/18/2019 05/13/19   Radene Gunning, NP  darifenacin (ENABLEX) 7.5 MG 24 hr tablet Take 7.5 mg by mouth daily. 07/23/19   [provider]  feeding supplement, GLUCERNA SHAKE, (GLUCERNA SHAKE) LIQD Take 237 mLs by mouth 3 (three) times daily between meals. 08/14/19   Swayze, Ava, DO  glucose blood (ONETOUCH VERIO) test strip Use as instructed to check blood sugar 3 times a day 09/04/18   Philemon Kingdom, MD  pantoprazole (PROTONIX) 40 MG  tablet Take 1 tablet (40 mg total) by mouth daily. Patient not taking: Reported on 09/18/2019 07/16/19   Carollee Herter, Kendrick Fries R, DO  sucralfate (CARAFATE) 1 g tablet TAKE 1 TABLET BY MOUTH TWICE DAILY Patient not taking: No sig reported 02/05/19   [place], Holli Humbles, NP     Critical care time: n/a      Noe Gens, MSN, NP-C [facility] 09/18/2019, 1:56 PM   Please see [email or web address] for pager details.

## 2019-09-18 NOTE — Progress Notes (Addendum)
PROGRESS NOTE    [name]  HAL:[id number] DOB: 1951/02/14 DOA: 09/17/2019 PCP: Ann Held, DO   Brief Narrative:  Patient is a 69 year old female with history of type 2 diabetes mellitus, coronary artery disease status post stenting, recently diagnosed DVT, admitted for Covid about 2 months ago and was recently discharged from rehab to home presented to the emergency department with complaints of increased weakness, rash around her right eye.  On presentation, she was found to be hypotensive with systolic blood pressure in the range of 70s, lactic acid of 4.  Chest x-ray shows pleural effusion with possible infiltrate.  Found to have AKI.  She also had sacral decubitus stage II ulcer with no signs of active infection.  Patient was admitted for the suspicion of possible sepsis.  Started on broad-spectrum antibiotics.  Culture sent.  Despite being given 4 L of IV fluids, her blood pressure did not improve.  Requested PCCM consultation today.  Assessment & Plan:   Principal Problem:   Hypotension Active Problems:   Stomach cancer (HCC)   SLEEP APNEA, OBSTRUCTIVE   Iron deficiency anemia   Type 2 diabetes with nephropathy (HCC)   CKD (chronic kidney disease) stage 3, GFR 30-59 ml/min (HCC)   History of DVT (deep vein thrombosis)   Suspected sepsis: Presented with hypotension, elevated lactic acid level.  Started on empiric antibiotics.  Follow-up cultures.  Procalcitonin of 1.2.  She has history of ESBL UTI.  Hypotension: Etiology not clear.  Elevated lactate on presentation.  Continue fluids .  Blood pressure still soft.  Antihypertensives on hold.  Cortisol normal.  On reviewing her previous charts, she has history of chronic hypotension but given current lethargy and weakness with elevated lactate, there is high suspicion for septic shock.  Right eye rash: apparently started in the nursing facility. CT orbits, CT head normal.  Conjunctiva looks normal and there is no  drainage.  Rash is periorbital .we will try mupirocin eye ointment   generalized weakness: Most likely secondary to hypotension.  Will request for PT/OT evaluation when appropriate.  She was discharged to rehab from here on 09/06/2019 and he stayed in rehab for 3 weeks.  Sacral decubitus stage II ulcer: Also has rash on the left lower groin from injury when transferring.  Continue supportive care.  Wound care consultation  Diabetes type 2: Continue sliding scale insulin.  Continue to monitor CBGs.  Hypoglycemic this morning.  Coronary artery disease: Status post stenting.  Denies chest pain.  Beta-blocker on hold due to low blood pressure.  She was on Plavix which was stopped at the nursing facility.  Most likely will continue it.  Normocytic anemia: Chronic.  Continue to monitor H&H  CKD stage III/hyperkalemia: We will check BMP today.  Creatinine appears to be at baseline.  Baseline creatinine ranges from 1.6-1.7.  History of DVT: On Eliquis  History of sleep apnea: Not on  CPAP.  Recent Covid infection: Currently respiratory status stable.  History of neuropathy: Takes tramadol  Pressure Injury 09/17/19 Sacrum Medial Stage 2 -  Partial thickness loss of dermis presenting as a shallow open injury with a red, pink wound bed without slough. (Active)  09/17/19 2307  Location: Sacrum  Location Orientation: Medial  Staging: Stage 2 -  Partial thickness loss of dermis presenting as a shallow open injury with a red, pink wound bed without slough.  Wound Description (Comments):   Present on Admission: Yes  DVT prophylaxis:Eliquis Code Status: Full Family Communication:  Disposition Plan: Patient is from home.  Clinically not stable for discharge due to hypotension.  Needs PT/OT evaluation before discharge.   Consultants: PCCM  Procedures: None  Antimicrobials:  Anti-infectives (From admission, onward)   Start     Dose/Rate Route Frequency Ordered Stop    09/19/19 2030  vancomycin (VANCOREADY) IVPB 1250 mg/250 mL     1,250 mg 166.7 mL/hr over 90 Minutes Intravenous Every 48 hours 09/17/19 2027     09/18/19 0615  metroNIDAZOLE (FLAGYL) IVPB 500 mg  Status:  Discontinued     500 mg 100 mL/hr over 60 Minutes Intravenous Every 8 hours 09/18/19 0609 09/18/19 0611   09/17/19 2030  aztreonam (AZACTAM) 2 g in sodium chloride 0.9 % 100 mL IVPB  Status:  Discontinued     2 g 200 mL/hr over 30 Minutes Intravenous  Once 09/17/19 2021 09/17/19 2027   09/17/19 2030  metroNIDAZOLE (FLAGYL) IVPB 500 mg     500 mg 100 mL/hr over 60 Minutes Intravenous  Once 09/17/19 2021 09/17/19 2140   09/17/19 2030  vancomycin (VANCOCIN) IVPB 1000 mg/200 mL premix  Status:  Discontinued     1,000 mg 200 mL/hr over 60 Minutes Intravenous  Once 09/17/19 2021 09/17/19 2027   09/17/19 2030  vancomycin (VANCOREADY) IVPB 2000 mg/400 mL     2,000 mg 200 mL/hr over 120 Minutes Intravenous  Once 09/17/19 2027 09/17/19 2356   09/17/19 2030  ceFEPIme (MAXIPIME) 2 g in sodium chloride 0.9 % 100 mL IVPB  Status:  Discontinued     2 g 200 mL/hr over 30 Minutes Intravenous Every 12 hours 09/17/19 2027 09/18/19 8921      Subjective:  Patient seen and examined at the bedside this morning.  She was hemodynamically stable during my evaluation.  Appears very weak, lethargic, hardly able to move on the bed.  Denies any nausea vomiting, chest pain or shortness of breath.  I was reported that her systolic blood pressure dropped to 80s around 11:30 AM.  Requested PCCM consult.  I discussed with the daughter on phone today.  Objective: Vitals:   09/18/19 0257 09/18/19 0352 09/18/19 0422 09/18/19 0548  BP: (!) 76/49 (!) 92/55 (!) 84/59   Pulse: 95     Resp: 19 13 20    Temp:    98.2 F (36.8 C)  TempSrc:    Oral  SpO2: 96%     Weight:      Height:        Intake/Output Summary (Last 24 hours) at 09/18/2019 0824 Last data filed at 09/18/2019 0319 Gross per 24 hour  Intake 2786.57 ml   Output --  Net 2786.57 ml   Filed Weights   09/17/19 1957  Weight: 95 kg    Examination:  General exam: Obese, generalized weakness  HEENT:PERRL,Oral mucosa moist, Ear/Nose normal on gross exam, periorbital rash, conjunctiva not involved Respiratory system: Diminished air sounds on the bases, no wheezes or crackles  Cardiovascular system: S1 & S2 heard, RRR. No JVD, murmurs, rubs, gallops or clicks.  Gastrointestinal system: Abdomen is nondistended, soft and nontender. No organomegaly or masses felt. Normal bowel sounds heard. Central nervous system: Alert and oriented. No focal neurological deficits. Extremities: no edema, no clubbing ,no cyanosis Skin: Stage II sacral ulcer    Data Reviewed: I have personally reviewed following labs and imaging studies  CBC: Recent Labs  Lab 09/17/19 1725  WBC 10.9*  HGB 10.8*  HCT 31.3*  MCV  86.5  PLT 409   Basic Metabolic Panel: Recent Labs  Lab 09/17/19 1725  NA 140  K 5.3*  CL 109  CO2 20*  GLUCOSE 88  BUN 12  CREATININE 1.88*  CALCIUM 8.3*   GFR: Estimated Creatinine Clearance: 33.7 mL/min (A) (by C-G formula based on SCr of 1.88 mg/dL (H)). Liver Function Tests: Recent Labs  Lab 09/17/19 1725  AST 64*  ALT 26  ALKPHOS 190*  BILITOT 1.2  PROT 5.0*  ALBUMIN 1.4*   Recent Labs  Lab 09/17/19 1725  LIPASE 10*   No results for input(s): AMMONIA in the last 168 hours. Coagulation Profile: No results for input(s): INR, PROTIME in the last 168 hours. Cardiac Enzymes: No results for input(s): CKTOTAL, CKMB, CKMBINDEX, TROPONINI in the last 168 hours. BNP (last 3 results) No results for input(s): PROBNP in the last 8760 hours. HbA1C: No results for input(s): HGBA1C in the last 72 hours. CBG: Recent Labs  Lab 09/17/19 1745 09/17/19 2004 09/17/19 2048 09/18/19 0803  GLUCAP 68* 55* 76 61*   Lipid Profile: No results for input(s): CHOL, HDL, LDLCALC, TRIG, CHOLHDL, LDLDIRECT in the last 72 hours. Thyroid  Function Tests: No results for input(s): TSH, T4TOTAL, FREET4, T3FREE, THYROIDAB in the last 72 hours. Anemia Panel: No results for input(s): VITAMINB12, FOLATE, FERRITIN, TIBC, IRON, RETICCTPCT in the last 72 hours. Sepsis Labs: Recent Labs  Lab 09/17/19 1725 09/17/19 1905 09/18/19 0040 09/18/19 0057 09/18/19 0359  PROCALCITON  --   --   --  1.21  --   LATICACIDVEN 4.5* 4.0* 2.8*  --  1.7    Recent Results (from the past 240 hour(s))  SARS CORONAVIRUS 2 (TAT 6-24 HRS) Nasopharyngeal Nasopharyngeal Swab     Status: None   Collection Time: 09/17/19  8:43 PM   Specimen: Nasopharyngeal Swab  Result Value Ref Range Status   SARS Coronavirus 2 NEGATIVE NEGATIVE Final    Comment: (NOTE) SARS-CoV-2 target nucleic acids are NOT DETECTED. The SARS-CoV-2 RNA is generally detectable in upper and lower respiratory specimens during the acute phase of infection. Negative results do not preclude SARS-CoV-2 infection, do not rule out co-infections with other pathogens, and should not be used as the sole basis for treatment or other patient management decisions. Negative results must be combined with clinical observations, patient history, and epidemiological information. The expected result is Negative. Fact Sheet for Patients: SugarRoll.be Fact Sheet for Healthcare Providers: https://www.woods-mathews.com/ This test is not yet approved or cleared by the [place] FDA and  has been authorized for detection and/or diagnosis of SARS-CoV-2 by FDA under an Emergency Use Authorization (EUA). This EUA will remain  in effect (meaning this test can be used) for the duration of the COVID-19 declaration under Section 56 4(b)(1) of the Act, 21 U.S.C. section 360bbb-3(b)(1), unless the authorization is terminated or revoked sooner. Performed at [facility], [address], [place], [place] [address]          Radiology Studies: CT HEAD WO  CONTRAST  Result Date: 09/18/2019 CLINICAL DATA:  Ataxia, gastric cancer, sleep apnea EXAM: CT HEAD WITHOUT CONTRAST TECHNIQUE: Contiguous axial images were obtained from the base of the skull through the vertex without intravenous contrast. COMPARISON:  08/10/2019 FINDINGS: Brain: No acute infarct or hemorrhage. Lateral ventricles and midline structures are stable. No acute extra-axial fluid collections. No mass effect. Vascular: No hyperdense vessel or unexpected calcification. Skull: Normal. Negative for fracture or focal lesion. Sinuses/Orbits: No acute finding. Other: None IMPRESSION: 1.  Stable head CT, no acute intracranial process. Electronically Signed   By: Randa Ngo M.D.   On: 09/18/2019 00:52   DG Chest Portable 1 View  Result Date: 09/17/2019 CLINICAL DATA:  69 year old female with hypotension EXAM: PORTABLE CHEST 1 VIEW COMPARISON:  Chest radiograph dated 09/02/2019. FINDINGS: Right-sided PICC with tip over central SVC in similar position. New small left pleural effusion and left lung base atelectasis. Infiltrate is not excluded. Clinical correlation is recommended. The right lung remains clear. No pneumothorax. Stable cardiomediastinal silhouette. No acute osseous pathology. IMPRESSION: Small left pleural effusion and left lung base atelectasis or infiltrate. Clinical correlation is recommended. No other interval change. Electronically Signed   By: Anner Crete M.D.   On: 09/17/2019 17:44   CT ORBITS WO CONTRAST  Result Date: 09/18/2019 CLINICAL DATA:  Headache, history of gastric cancer EXAM: CT ORBITS WITHOUT CONTRAST TECHNIQUE: Multidetector CT images were obtained using the standard protocol without intravenous contrast. COMPARISON:  08/10/2019 FINDINGS: Orbits: No traumatic or inflammatory finding. Globes, optic nerves, orbital fat, extraocular muscles, vascular structures, and lacrimal glands are normal. Visualized sinuses: Clear. Soft tissues: Soft tissues are unremarkable.  Limited intracranial: No significant or unexpected finding. IMPRESSION: No evidence of traumatic or inflammatory process. Electronically Signed   By: Randa Ngo M.D.   On: 09/18/2019 00:54        Scheduled Meds: . apixaban  5 mg Oral BID  . Chlorhexidine Gluconate Cloth  6 each Topical Daily  . clopidogrel  75 mg Oral Daily  . darifenacin  7.5 mg Oral Daily  . gabapentin  400 mg Oral TID  . insulin aspart  0-9 Units Subcutaneous TID WC  . ofloxacin  1 drop Right Eye QID  . pantoprazole  40 mg Oral Daily  . rosuvastatin  20 mg Oral q1800  . sertraline  50 mg Oral Daily  . sucralfate  1 g Oral BID   Continuous Infusions: . sodium chloride 125 mL/hr at 09/18/19 0133  . [START ON 09/19/2019] vancomycin       LOS: 1 day    Time spent: 35 mins.More than 50% of that time was spent in counseling and/or coordination of care.      Shelly Coss, MD [facility] P2/24/2021, 8:24 AM

## 2019-09-19 DIAGNOSIS — I959 Hypotension, unspecified: Secondary | ICD-10-CM | POA: Diagnosis not present

## 2019-09-19 DIAGNOSIS — N179 Acute kidney failure, unspecified: Secondary | ICD-10-CM | POA: Diagnosis not present

## 2019-09-19 DIAGNOSIS — N183 Chronic kidney disease, stage 3 unspecified: Secondary | ICD-10-CM | POA: Diagnosis not present

## 2019-09-19 LAB — BASIC METABOLIC PANEL
Anion gap: 8 (ref 5–15)
BUN: 13 mg/dL (ref 8–23)
CO2: 23 mmol/L (ref 22–32)
Calcium: 7 mg/dL — ABNORMAL LOW (ref 8.9–10.3)
Chloride: 110 mmol/L (ref 98–111)
Creatinine, Ser: 1.88 mg/dL — ABNORMAL HIGH (ref 0.44–1.00)
GFR calc Af Amer: 31 mL/min — ABNORMAL LOW (ref >60)
GFR calc non Af Amer: 27 mL/min — ABNORMAL LOW (ref >60)
Glucose, Bld: 225 mg/dL — ABNORMAL HIGH (ref 70–99)
Potassium: 4.9 mmol/L (ref 3.5–5.1)
Sodium: 141 mmol/L (ref 135–145)

## 2019-09-19 LAB — CBC WITH DIFFERENTIAL/PLATELET
Abs Immature Granulocytes: 0.08 10*3/uL — ABNORMAL HIGH (ref 0.00–0.07)
Basophils Absolute: 0 10*3/uL (ref 0.0–0.1)
Basophils Relative: 0 %
Eosinophils Absolute: 0 10*3/uL (ref 0.0–0.5)
Eosinophils Relative: 0 %
HCT: 26 % — ABNORMAL LOW (ref 36.0–46.0)
Hemoglobin: 8.8 g/dL — ABNORMAL LOW (ref 12.0–15.0)
Immature Granulocytes: 1 %
Lymphocytes Relative: 15 %
Lymphs Abs: 1.7 10*3/uL (ref 0.7–4.0)
MCH: 29.2 pg (ref 26.0–34.0)
MCHC: 33.8 g/dL (ref 30.0–36.0)
MCV: 86.4 fL (ref 80.0–100.0)
Monocytes Absolute: 0.3 10*3/uL (ref 0.1–1.0)
Monocytes Relative: 3 %
Neutro Abs: 9.2 10*3/uL — ABNORMAL HIGH (ref 1.7–7.7)
Neutrophils Relative %: 81 %
Platelets: 248 10*3/uL (ref 150–400)
RBC: 3.01 MIL/uL — ABNORMAL LOW (ref 3.87–5.11)
RDW: 17.2 % — ABNORMAL HIGH (ref 11.5–15.5)
WBC: 11.3 10*3/uL — ABNORMAL HIGH (ref 4.0–10.5)
nRBC: 0.2 % (ref 0.0–0.2)

## 2019-09-19 LAB — GLUCOSE, CAPILLARY
Glucose-Capillary: 170 mg/dL — ABNORMAL HIGH (ref 70–99)
Glucose-Capillary: 241 mg/dL — ABNORMAL HIGH (ref 70–99)
Glucose-Capillary: 254 mg/dL — ABNORMAL HIGH (ref 70–99)
Glucose-Capillary: 256 mg/dL — ABNORMAL HIGH (ref 70–99)
Glucose-Capillary: 281 mg/dL — ABNORMAL HIGH (ref 70–99)

## 2019-09-19 LAB — PROCALCITONIN: Procalcitonin: 1.07 ng/mL

## 2019-09-19 LAB — HEMOGLOBIN AND HEMATOCRIT, BLOOD
HCT: 25 % — ABNORMAL LOW (ref 36.0–46.0)
HCT: 24.6 % — ABNORMAL LOW (ref 36.0–46.0)
Hemoglobin: 8.5 g/dL — ABNORMAL LOW (ref 12.0–15.0)
Hemoglobin: 8.3 g/dL — ABNORMAL LOW (ref 12.0–15.0)

## 2019-09-19 MED ORDER — SODIUM CHLORIDE 0.9% FLUSH
10.0000 mL | Freq: Two times a day (BID) | INTRAVENOUS | Status: DC
  Administered 2019-09-19: 20 mL
  Administered 2019-09-19 – 2019-09-27 (×16): 10 mL

## 2019-09-19 MED ORDER — GABAPENTIN 100 MG PO CAPS
200.0000 mg | ORAL_CAPSULE | Freq: Three times a day (TID) | ORAL | Status: DC
  Administered 2019-09-19 – 2019-09-27 (×25): 200 mg via ORAL
  Filled 2019-09-19 (×25): qty 2

## 2019-09-19 MED ORDER — MAGIC MOUTHWASH W/LIDOCAINE
10.0000 mL | Freq: Three times a day (TID) | ORAL | Status: DC | PRN
  Administered 2019-09-20: 10 mL via ORAL
  Filled 2019-09-19 (×2): qty 10

## 2019-09-19 MED ORDER — LACTATED RINGERS IV SOLN: INTRAVENOUS | Status: DC

## 2019-09-19 MED ORDER — SODIUM CHLORIDE 0.9% FLUSH: 10.0000 mL | INTRAVENOUS | Status: DC | PRN

## 2019-09-19 MED ORDER — INSULIN ASPART 100 UNIT/ML ~~LOC~~ SOLN
0.0000 [IU] | SUBCUTANEOUS | Status: DC
  Administered 2019-09-19: 5 [IU] via SUBCUTANEOUS
  Administered 2019-09-19: 8 [IU] via SUBCUTANEOUS
  Administered 2019-09-19: 3 [IU] via SUBCUTANEOUS
  Administered 2019-09-19: 8 [IU] via SUBCUTANEOUS
  Administered 2019-09-20 (×4): 3 [IU] via SUBCUTANEOUS
  Administered 2019-09-20: 5 [IU] via SUBCUTANEOUS
  Administered 2019-09-21: 2 [IU] via SUBCUTANEOUS

## 2019-09-19 NOTE — Progress Notes (Signed)
RN called [facility] and alerted them to the fact that the patient has not voided yet during the night.  This is despite the patient attempting to do so.  Bladder scan showed a volume of 487m.  Will await orders from MD.

## 2019-09-19 NOTE — Progress Notes (Signed)
eLink Physician-Brief Progress Note Patient Name: [name] DOB: 10-Oct-1950 MRN: [id number]   Date of Service  09/19/2019  HPI/Events of Note  Oliguria - Bladder scan with 400 mL plus residual.   eICU Interventions  Will order: 1. I/O cath PRN.      Intervention Category Intermediate Interventions: Oliguria - evaluation and management  Lelania Bia Eugene 09/19/2019, 2:23 AM

## 2019-09-19 NOTE — Progress Notes (Addendum)
NAME:  [name], MRN:  [id number], DOB:  1950-12-22, LOS: 2 ADMISSION DATE:  09/17/2019, CONSULTATION DATE:  09/18/19 REFERRING MD:  Dr. Tawanna Solo, CHIEF COMPLAINT:  Hypotension    Brief History   69 y/o F with multiple recent hospitalizations for weakness, hypotension, dehydration secondary to diarrhea and poor po intake, and recent ESBL E. Coli UTI after recent COVID in December.  Readmitted 2/23 with hypotension and dehydration.  Transferred to ICU 2/24 requiring low dose vasopressor and bicarb gtt.   History of present illness   69 y/o F who was admitted on 2/23 with reports of weakness and fatigue.    The patient was admitted in 04/2019 for chest pain (medical management recommended) and noted to have significant diarrhea.  She tested positive for enteropathogenic E-Coli by GI PCR.  It does not appear that she had GI follow up at that time. Her primary MD gave her hyoscyamine PRN for diarrhea.   She was hospitalized 12/31-1/5 after testing positive for COVID (did not require intubation).  That hospitalization was notable for acute DVT in the R peroneal vein and volume depletion due to dehydration / poor intake. She was started on Eliquis for DVT.  She was hospitalized again 1/14-1/21 and again 1/22 to 1/29 for poor appetite, hypotension / failure to thrive with generalized weakness and falls.  She was admitted yet again 2/8-2/12 for ESBL E.Coli UTI.    The patient lives alone. 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  Her daughter helps care for her.  She orders her medications by mail and reports she has been compliant with taking them.   She states she lost her taste/smell with COVID and has had no appetite since.  She finds it difficult to eat and has had intermittent diarrhea for weeks.  She goes to the bathroom sometimes 4 times per day. She notes decreased UOP.  She was discharged from SNF on 2/23 and her daughter brought her to the ER with reports of weakness, inability to  walk, right eye swelling and fatigue. She notes she has lost approximately 30 lbs in the last month.  She denies fevers, chills, n/v, LE swelling, chest pain, pain with inspiration.  On presentation, she had hypotension with SBP in the 70's, AKI, lactic acidosis.  She was treated with approximately 4L IVF's with modest improvement in her blood pressure.  Cortisol was borderline low at 15.  CT head negative and CT orbits negative for inflammation.  Renal function improved with fluid resuscitation, lactic acid cleared.  BP remained soft with SBP in the 80's. PCCM consulted.   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 2/24 PCCM consulted/ tx to ICU   Consults:  PCCM   Procedures:  R Chest Port-a-Cath >>  Significant Diagnostic Tests:   ECHO 09/03/19 >> LVEF 65-70%, G1DD, no RWMA, normal RV, mild MR  CT Head 2/24 >> stable head CT, no acute intracranial process  CT Orbits 2/24 >> no evidence of traumatic or inflammatory process   Micro Data:  BCx2 2/23 >> ngtd UC 2/23 >> GNR >100k colonies COVID 2/23 >> negative  Antimicrobials:  Flagyl 2/23 x1 Cefepime 2/24 x1 Meropenem 2/24 >>  Vanco 2/25 >>   Interim history/subjective:  Transferred to ICU yesterday afternoon for ongoing hypotension Remains on Neo 25 mcg/min Afebrile  WBC stable  Hgb 10.8 -> 8.8 No stools overnight GNR in UC thus far  Patient complains of nausea after drinking ensure which is not new for her.  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  Complains of increased hand pain/  neuropathy   Objective   Blood pressure 104/70, pulse (!) 109, temperature 98.7 F (37.1 C), temperature source Axillary, resp. rate (!) 21, height 5' 6"  (1.676 m), weight 95 kg, SpO2 100 %.  Room Air        Intake/Output Summary (Last 24 hours) at 09/19/2019 0109 Last data filed at 09/19/2019 0600 Gross per 24 hour  Intake 4369.88 ml  Output 300 ml  Net 4069.88 ml   Filed Weights   09/17/19 1957  Weight: 95 kg   Examination: General:  Pale elderly female lying in bed in NAD HEENT: MM pink/moist, poor dentition, dry crusting to eyes and right eye with darker pigmented crusting Neuro: Awake, oriented x 3, MAE, generalized weak CV: rr, ST PULM:  Non labored, clear anteriorly, diminished in bases GI: obese, minimal epigastric tenderness, otherwise NT/ ND, +bs Extremities: warm/dry, no LE edema  Skin: multiple areas of darker pigmented dry scaling skin  Resolved Hospital Problem list      Assessment & Plan:   Hypotension  Volume Depletion  Adrenal Insufficiency  Rule Out Sepsis  - Suspect hypotension is multifactorial in the setting of volume depletion and relative adrenal insufficiency.  She has had prior ESBL E-Coli UTI's in the past.  Must also consider component of infection P:  Tele monitoring Continue Neo for MAP goal > 65, hopeful this can be weaned off PCT 1.21-> 1.07 Following cultures, UC showing 100K of GNR Continue meropenum and vanc for now Improved NGMA Ruling out ABLA as below with Hgb drop  AKI  - worsening sCr, UOP x 1 overnight, still looks volume depleted and having poor PO intake - Bicarb gtt x 20 hours completed, resolved NGMA - will start MIVF, LR at 75 till PO intake can increase - strict I/Os, daily wts  - BMET in am   Diarrhea  - no diarrhea over last 24 hours  Nausea (has chronic esophageal and gastric dysmotility) - prn reglan and zofran - monitoring QTc  Small Left Pleural Effusion with Atelectasis - monitor - maximize nutrition -  push pulm hygiene  Hx DVT  -continue eliquis for now  Anemia  - 2 gm drop in Hgb - guaiac stool  - recheck Hgb now and in 6 hrs to rule out ABLA as patient is on eliquis and plavix   DMT2 - change to TID SSI to q4 moderate, if not better controlled will need to add levemir - holding home metformin   Eye Hyperpigmentation   - CT negative for cellulitis  Decubitus Ulcers in the setting of Severe Protein Calorie Malnutrition and Immobility  - Stage II - RD consult - will try magic mouth wash prn to help with mouth pain  Chronic pain/ neuropathy  - gabapentin, resume reduced home dose - prn tramadol   Global:  Overall, concerned for failure to thrive.  I have asked her about short term feeding tube in which I think would help on  multiple areas, however she does not have interest at this time.  If we can not increase her PO intake/ nutrition, she will continue to have hospitalizations and this will be a recurrent issue for her.  I have placed a palliative care consult to help further explore goals of care in hopes we can honor her wishes moving forward, she is agreeable to speak with them.   Best practice:  Diet: as tolerated DVT prophylaxis: eliquis GI prophylaxis: PPI  Glucose control: SSI  Mobility: push/ PT  Code Status: Full Code  Family Communication: patient updated on plan of care  Disposition: ICU for now   Labs   CBC: Recent Labs  Lab 09/17/19 1725 09/19/19 0528  WBC 10.9* 11.3*  NEUTROABS  --  9.2*  HGB 10.8* 8.8*  HCT 31.3* 26.0*  MCV 86.5 86.4  PLT 296 419    Basic Metabolic Panel: Recent Labs  Lab 09/17/19 1725 09/18/19 1057 09/19/19 0528  NA 140 140 141  K 5.3* 4.9 4.9  CL 109 113* 110  CO2 20* 19* 23  GLUCOSE 88 118* 225*  BUN 12 11 13   CREATININE 1.88* 1.64* 1.88*  CALCIUM 8.3* 7.0* 7.0*   GFR: Estimated Creatinine Clearance: 33.7 mL/min (A) (by C-G formula based on SCr of 1.88 mg/dL (H)). Recent Labs  Lab 09/17/19 1725  09/17/19 1905 09/18/19 0040 09/18/19 0057 09/18/19 0359 09/19/19 0528  PROCALCITON  --   --   --  1.21  --  1.07  WBC 10.9*  --   --   --   --  11.3*  LATICACIDVEN 4.5* 4.0* 2.8*  --  1.7  --     Liver Function Tests: Recent Labs  Lab 09/17/19 1725  AST 64*  ALT 26  ALKPHOS 190*  BILITOT 1.2  PROT 5.0*  ALBUMIN 1.4*   Recent Labs  Lab 09/17/19 1725  LIPASE 10*   No results for input(s): AMMONIA in the last 168 hours.  ABG    Component Value Date/Time   PHART 7.402 (H) 01/19/2009 1611   PCO2ART 42.4 01/19/2009 1611   PO2ART 91.0 01/19/2009 1611   HCO3 25.8 (H) 01/19/2009 1611   TCO2 28 12/17/2016 1539   O2SAT 97.1 01/19/2009 1611     Coagulation Profile: No results for input(s): INR, PROTIME in the last 168 hours.  Cardiac Enzymes: No results for input(s): CKTOTAL, CKMB, CKMBINDEX, TROPONINI in the last 168 hours.  HbA1C: Hemoglobin A1c  Date/Time Value Ref Range Status  06/27/2016 11:43 AM 7.1 (H) 4.8 - 5.6 % Final    Comment:             Pre-diabetes: 5.7 - 6.4          Diabetes: >6.4          Glycemic control for adults with diabetes: <7.0    Hgb A1c MFr Bld  Date/Time Value Ref Range Status  07/27/2019 02:40 AM 8.7 (H) 4.8 - 5.6 % Final    Comment:    (NOTE) Pre diabetes:          5.7%-6.4% Diabetes:              >6.4% Glycemic control for   <7.0% adults with diabetes   05/13/2019 08:07 AM 10.0 (H) 4.8 - 5.6 % Final    Comment:    (NOTE)         Prediabetes: 5.7 - 6.4         Diabetes: >6.4  Glycemic control for adults with diabetes: <7.0     CBG: Recent Labs  Lab 09/18/19 0823 09/18/19 0842 09/18/19 0906 09/18/19 1219 09/18/19 1705  GLUCAP 60* 58* 111* 113* 125*    Critical care time: 35 mins      Kennieth Rad, MSN, AGACNP-BC [facility] 09/19/2019, 7:28 AM

## 2019-09-19 NOTE — Progress Notes (Signed)
Inpatient Diabetes Program Recommendations  AACE/ADA: New Consensus Statement on Inpatient Glycemic Control (2015)  Target Ranges:  Prepandial:   less than 140 mg/dL      Peak postprandial:   less than 180 mg/dL (1-2 hours)      Critically ill patients:  140 - 180 mg/dL   Lab Results  Component Value Date   GLUCAP 281 (H) 09/19/2019   HGBA1C 8.7 (H) 07/27/2019    Review of Glycemic Control Results for [name], [name] (MRN [id number]) as of 09/19/2019 11:30  Ref. Range 09/18/2019 09:06 09/18/2019 12:19 09/18/2019 17:05 09/19/2019 08:18  Glucose-Capillary Latest Ref Range: 70 - 99 mg/dL 111 (H) 113 (H) 125 (H) 281 (H)   Diabetes history: Type 2 DM Outpatient Diabetes medications: Metformin 500 mg BID Current orders for Inpatient glycemic control: Novolog 0-15 units Q4H Solucortef 50 mg Q6H  Inpatient Diabetes Program Recommendations:    In the setting of steroids, consider adding Levemir 10 units BID.  If plan is start tube feeds, also consider adding Novolog 3 units Q4H (to be stopped or held in the event tube feeds are stopped).  Patient is followed by Dr Cruzita Lederer, outpatient endocrinology. Per MD notes, patient has struggled to afford [place] and other long acting analogs. Last 08/2018 I spoke with this patient regarding Novolin 70/30 at discharge, but appears patient was not taking this at the follow up visit?   Thanks, Bronson Curb, MSN, RNC-OB Diabetes Coordinator [phone] (8a-5p)

## 2019-09-20 ENCOUNTER — Inpatient Hospital Stay: Payer: Medicare HMO | Admitting: Hematology & Oncology

## 2019-09-20 ENCOUNTER — Inpatient Hospital Stay: Payer: Medicare HMO | Attending: Family

## 2019-09-20 ENCOUNTER — Inpatient Hospital Stay (HOSPITAL_COMMUNITY): Payer: Medicare HMO

## 2019-09-20 ENCOUNTER — Encounter (HOSPITAL_COMMUNITY): Payer: Self-pay | Admitting: Internal Medicine

## 2019-09-20 DIAGNOSIS — I9589 Other hypotension: Secondary | ICD-10-CM | POA: Diagnosis not present

## 2019-09-20 DIAGNOSIS — I959 Hypotension, unspecified: Secondary | ICD-10-CM | POA: Diagnosis not present

## 2019-09-20 DIAGNOSIS — E861 Hypovolemia: Secondary | ICD-10-CM | POA: Diagnosis not present

## 2019-09-20 DIAGNOSIS — E1121 Type 2 diabetes mellitus with diabetic nephropathy: Secondary | ICD-10-CM | POA: Diagnosis not present

## 2019-09-20 DIAGNOSIS — Z515 Encounter for palliative care: Secondary | ICD-10-CM

## 2019-09-20 DIAGNOSIS — Z7189 Other specified counseling: Secondary | ICD-10-CM

## 2019-09-20 DIAGNOSIS — N179 Acute kidney failure, unspecified: Secondary | ICD-10-CM | POA: Diagnosis not present

## 2019-09-20 DIAGNOSIS — N183 Chronic kidney disease, stage 3 unspecified: Secondary | ICD-10-CM | POA: Diagnosis not present

## 2019-09-20 DIAGNOSIS — Z86718 Personal history of other venous thrombosis and embolism: Secondary | ICD-10-CM | POA: Diagnosis not present

## 2019-09-20 DIAGNOSIS — E46 Unspecified protein-calorie malnutrition: Secondary | ICD-10-CM | POA: Diagnosis not present

## 2019-09-20 DIAGNOSIS — Z20822 Contact with and (suspected) exposure to covid-19: Secondary | ICD-10-CM | POA: Diagnosis not present

## 2019-09-20 DIAGNOSIS — R571 Hypovolemic shock: Secondary | ICD-10-CM | POA: Diagnosis not present

## 2019-09-20 DIAGNOSIS — A419 Sepsis, unspecified organism: Secondary | ICD-10-CM | POA: Diagnosis not present

## 2019-09-20 DIAGNOSIS — R6521 Severe sepsis with septic shock: Secondary | ICD-10-CM | POA: Diagnosis not present

## 2019-09-20 LAB — RENAL FUNCTION PANEL
Albumin: 1.2 g/dL — ABNORMAL LOW (ref 3.5–5.0)
Anion gap: 8 (ref 5–15)
BUN: 13 mg/dL (ref 8–23)
CO2: 23 mmol/L (ref 22–32)
Calcium: 7.2 mg/dL — ABNORMAL LOW (ref 8.9–10.3)
Chloride: 110 mmol/L (ref 98–111)
Creatinine, Ser: 2.08 mg/dL — ABNORMAL HIGH (ref 0.44–1.00)
GFR calc Af Amer: 28 mL/min — ABNORMAL LOW (ref >60)
GFR calc non Af Amer: 24 mL/min — ABNORMAL LOW (ref >60)
Glucose, Bld: 233 mg/dL — ABNORMAL HIGH (ref 70–99)
Phosphorus: 2.4 mg/dL — ABNORMAL LOW (ref 2.5–4.6)
Potassium: 4.5 mmol/L (ref 3.5–5.1)
Sodium: 141 mmol/L (ref 135–145)

## 2019-09-20 LAB — GLUCOSE, CAPILLARY
Glucose-Capillary: 184 mg/dL — ABNORMAL HIGH (ref 70–99)
Glucose-Capillary: 195 mg/dL — ABNORMAL HIGH (ref 70–99)
Glucose-Capillary: 174 mg/dL — ABNORMAL HIGH (ref 70–99)
Glucose-Capillary: 167 mg/dL — ABNORMAL HIGH (ref 70–99)
Glucose-Capillary: 217 mg/dL — ABNORMAL HIGH (ref 70–99)

## 2019-09-20 LAB — URINE CULTURE: Culture: >=100000 — AB

## 2019-09-20 LAB — MAGNESIUM: Magnesium: 1.6 mg/dL — ABNORMAL LOW (ref 1.7–2.4)

## 2019-09-20 LAB — PROCALCITONIN: Procalcitonin: 0.92 ng/mL

## 2019-09-20 LAB — CBC
HCT: 26.2 % — ABNORMAL LOW (ref 36.0–46.0)
Hemoglobin: 8.8 g/dL — ABNORMAL LOW (ref 12.0–15.0)
MCH: 29.3 pg (ref 26.0–34.0)
MCHC: 33.6 g/dL (ref 30.0–36.0)
MCV: 87.3 fL (ref 80.0–100.0)
Platelets: 235 10*3/uL (ref 150–400)
RBC: 3 MIL/uL — ABNORMAL LOW (ref 3.87–5.11)
RDW: 16.9 % — ABNORMAL HIGH (ref 11.5–15.5)
WBC: 17.5 10*3/uL — ABNORMAL HIGH (ref 4.0–10.5)
nRBC: 0.2 % (ref 0.0–0.2)

## 2019-09-20 LAB — PHOSPHORUS: Phosphorus: 2.2 mg/dL — ABNORMAL LOW (ref 2.5–4.6)

## 2019-09-20 MED ORDER — PRO-STAT SUGAR FREE PO LIQD
30.0000 mL | Freq: Two times a day (BID) | ORAL | Status: DC
  Administered 2019-09-20 – 2019-09-23 (×8): 30 mL
  Filled 2019-09-20 (×12): qty 30

## 2019-09-20 MED ORDER — CLONAZEPAM 0.125 MG PO TBDP
0.1250 mg | ORAL_TABLET | Freq: Once | ORAL | Status: AC
  Administered 2019-09-20: 0.125 mg via ORAL
  Filled 2019-09-20: qty 1

## 2019-09-20 MED ORDER — JUVEN PO PACK
1.0000 | PACK | Freq: Two times a day (BID) | ORAL | Status: DC
  Administered 2019-09-21 – 2019-09-23 (×5): 1
  Filled 2019-09-20 (×7): qty 1

## 2019-09-20 MED ORDER — OSMOLITE 1.2 CAL PO LIQD
1000.0000 mL | ORAL | Status: DC
  Filled 2019-09-20 (×4): qty 1000

## 2019-09-20 MED ORDER — MAGNESIUM SULFATE 2 GM/50ML IV SOLN
2.0000 g | Freq: Once | INTRAVENOUS | Status: AC
  Administered 2019-09-20: 2 g via INTRAVENOUS
  Filled 2019-09-20: qty 50

## 2019-09-20 MED ORDER — SODIUM CHLORIDE 0.9 % IV SOLN
3.0000 g | Freq: Two times a day (BID) | INTRAVENOUS | Status: DC
  Administered 2019-09-20 – 2019-09-22 (×4): 3 g via INTRAVENOUS
  Filled 2019-09-20 (×2): qty 8
  Filled 2019-09-20: qty 3
  Filled 2019-09-20: qty 8
  Filled 2019-09-20 (×2): qty 3

## 2019-09-20 MED ORDER — OXYBUTYNIN CHLORIDE ER 5 MG PO TB24
5.0000 mg | ORAL_TABLET | Freq: Every day | ORAL | Status: DC
  Administered 2019-09-20 – 2019-09-26 (×7): 5 mg via ORAL
  Filled 2019-09-20 (×7): qty 1

## 2019-09-20 MED ORDER — OSMOLITE 1.2 CAL PO LIQD
1000.0000 mL | ORAL | Status: DC
  Filled 2019-09-20: qty 1000

## 2019-09-20 MED ORDER — HYDROCORTISONE NA SUCCINATE PF 100 MG IJ SOLR
50.0000 mg | Freq: Two times a day (BID) | INTRAMUSCULAR | Status: DC
  Administered 2019-09-20 – 2019-09-22 (×4): 50 mg via INTRAVENOUS
  Filled 2019-09-20 (×4): qty 2

## 2019-09-20 MED ORDER — INSULIN DETEMIR 100 UNIT/ML ~~LOC~~ SOLN
10.0000 [IU] | Freq: Two times a day (BID) | SUBCUTANEOUS | Status: DC
  Administered 2019-09-20 – 2019-09-22 (×6): 10 [IU] via SUBCUTANEOUS
  Filled 2019-09-20 (×9): qty 0.1
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
## 2019-09-20 NOTE — Progress Notes (Signed)
Inpatient Diabetes Program Recommendations  AACE/ADA: New Consensus Statement on Inpatient Glycemic Control (2015)  Target Ranges:  Prepandial:   less than 140 mg/dL      Peak postprandial:   less than 180 mg/dL (1-2 hours)      Critically ill patients:  140 - 180 mg/dL   Lab Results  Component Value Date   GLUCAP 195 (H) 09/20/2019   HGBA1C 8.7 (H) 07/27/2019    Review of Glycemic Control Results for [name], [name] (MRN [id number]) as of 09/20/2019 10:49  Ref. Range 09/19/2019 19:49 09/19/2019 23:36 09/20/2019 04:26 09/20/2019 08:00  Glucose-Capillary Latest Ref Range: 70 - 99 mg/dL 254 (H) 241 (H) 184 (H) 195 (H)   Diabetes history: Type 2 DM Outpatient Diabetes medications: Metformin 500 mg BID Current orders for Inpatient glycemic control: Novolog 0-15 units Q4H, Levemir 10 units BID Solucortef 50 mg Q6H  Inpatient Diabetes Program Recommendations:    If plan is start tube feeds, also consider adding Novolog 3 units Q4H (to be stopped or held in the event tube feeds are stopped).  Thanks, Bronson Curb, MSN, RNC-OB Diabetes Coordinator [phone] (8a-5p)

## 2019-09-20 NOTE — Progress Notes (Addendum)
Initial Nutrition Assessment  DOCUMENTATION CODES:   Obesity unspecified  INTERVENTION:   Initiate Osmolite 1.2 @ 30 ml/hr via Cortrak tube (once tip advanced post pyloric) and increase by 10 ml every 12 hours to goal rate of 60 ml/hr (1440 ml/day)  30 ml Prostat BID  At goal provides: 1928 kcal, 109 grams protein, and 1167 ml free water.   -1 packet Juven BID, each packet provides 95 calories, 2.5 grams of protein (collagen), and 9.8 grams of carbohydrate (3 grams sugar); also contains 7 grams of L-arginine and L-glutamine, 300 mg vitamin C, 15 mg vitamin E, 1.2 mcg vitamin B-12, 9.5 mg zinc, 200 mg calcium, and 1.5 g  Calcium Beta-hydroxy-Beta-methylbutyrate to support wound healing  Encourage PO intake  NUTRITION DIAGNOSIS:   Inadequate oral intake related to dysphagia, early satiety, nausea as evidenced by per patient/family report.  GOAL:   Patient will meet greater than or equal to 90% of their needs  MONITOR:   TF tolerance, PO intake  REASON FOR ASSESSMENT:   Consult Enteral/tube feeding initiation and management, Assessment of nutrition requirement/status  ASSESSMENT:   Pt with PMH of DM, HTN, OSA, dysmotility s/p partial gastrectomy for gastric cancer 10 years ago, pt has had multiple hospitalizations for hypotension and dehydration secondary to diarrhea and poor po intake since having COVID-19 in 06/2019.   Pt discussed during ICU rounds and with RN.  Spoke with Palliative and CCM Pt now weaned off pressors and transferring to floor.   Pt reports a 30 lb weight loss in the last 3 weeks. She reports this started after having an EGD and then being changed to pureed foods which she does not like.  She reports that she likes vanilla and strawberry ensure but per RN she refuses to drink them when offered.  She reports being in rehab PTA and used a walker to get around. She does not like the food there. She reports no appetite. She has some nausea and and  occasionally vomits but not often.  Per chart review pt's weight has fluctuated from 87 kg to 101 kg.  Pt and daughter report that she has had a sacral wound for a few weeks and it is painful.  Pt on Heart Healthy/CHO modified diet, no complaints of trouble chewing or swallowing at this time but is also not eating.   Palliative consulted  2/26 Cortrak placed in stomach, pending further advancement per IR.   Medications reviewed and include: solucortef, SSI, 10 units levemir BID, carafate Mag sulfate x 1 IV Labs reviewed: PO4: 2.4 (L), Magnesium: 1.6 (L) CBG's: 349-179-150   NUTRITION - FOCUSED PHYSICAL EXAM:    Most Recent Value  Orbital Region  No depletion  Upper Arm Region  No depletion  Thoracic and Lumbar Region  No depletion  Buccal Region  No depletion  Temple Region  No depletion  Clavicle Bone Region  No depletion  Clavicle and Acromion Bone Region  No depletion  Scapular Bone Region  No depletion  Dorsal Hand  Moderate depletion  Patellar Region  No depletion  Anterior Thigh Region  No depletion  Posterior Calf Region  No depletion  Edema (RD Assessment)  Mild  Hair  Reviewed  Eyes  Reviewed  Mouth  Reviewed  Skin  Reviewed  Nails  Unable to assess [artificial nails]       Diet Order:   Diet Order            Diet heart healthy/carb modified Room service appropriate? Yes;  Fluid consistency: Thin  Diet effective now              EDUCATION NEEDS:   No education needs have been identified at this time  Skin:  Skin Assessment: Skin Integrity Issues: Skin Integrity Issues:: Stage II Stage II: sacrum  Last BM:  2/26  Height:   Ht Readings from Last 1 Encounters:  09/17/19 5' 6"  (1.676 m)    Weight:   Wt Readings from Last 1 Encounters:  09/20/19 101.8 kg    Ideal Body Weight:  59 kg  BMI:  Body mass index is 36.22 kg/m.  Estimated Nutritional Needs:   Kcal:  1800-2000  Protein:  95-115 grams  Fluid:  >1.8 L/day  Lockie Pares., RD,  LDN, CNSC See AMiON for contact information

## 2019-09-20 NOTE — Procedures (Signed)
Cortrak  Person Inserting Tube:  Jacklynn Barnacle E, RD Tube Type:  Cortrak - 43 inches Tube Location:  Left nare Initial Placement:  Stomach Secured by: Bridle Technique Used to Measure Tube Placement:  Documented cm marking at nare/ corner of mouth Cortrak Secured At:  58 cm    Cortrak Tube Team Note:  Consult received to place a Cortrak feeding tube.   X-ray is required, abdominal x-ray has been ordered by the Cortrak team. Please confirm tube placement before using the Cortrak tube.   If the tube becomes dislodged please keep the tube and contact the Cortrak team at [email or web address] ([email or web address]) for replacement.  If after hours and replacement cannot be delayed, place a NG tube and confirm placement with an abdominal x-ray.    Jacklynn Barnacle, MS, RD, LDN Pager number available on Amion

## 2019-09-20 NOTE — Evaluation (Signed)
Physical Therapy Evaluation Patient Details Name: [name] MRN: [id number] DOB: [birth year] Today's Date: 09/20/2019   History of Present Illness  69 y.o. female with past medical history significant for DM2, HLD, Asthma who presented to the ED on 2/23 with shock secondary to hypovolemia, adrenal insufficiency, renal insufficiency, and Ecoli UTI. Pt with recent stay at SNF, d/c home for 1 day and readmitted. Pt with recent [place] and [facility] admission for covid 06/2019.  Clinical Impression   Pt presents with generalized weakness, R hip pain, difficulty performing mobility tasks, unsteadiness in standing and very increased difficulty transferring to stand, and decreased activity tolerance. Pt to benefit from acute PT to address deficits. Pt required mod-max assist for bed mobility and transfer to stand, and pt unable to ambulate today due to weakness. PT recommending SNF level of care post-acutely.  PT to progress mobility as tolerated, and will continue to follow acutely.      Follow Up Recommendations SNF;Supervision/Assistance - 24 hour    Equipment Recommendations  None recommended by PT    Recommendations for Other Services       Precautions / Restrictions Precautions Precautions: Fall Restrictions Weight Bearing Restrictions: No      Mobility  Bed Mobility Overal bed mobility: Needs Assistance Bed Mobility: Supine to Sit;Sit to Supine     Supine to sit: Mod assist;HOB elevated Sit to supine: Max assist;HOB elevated   General bed mobility comments: Mod-max assist for supine<>sit for trunk and LE management, scooting up in bed with use of bed pads upon return to supine.  Transfers Overall transfer level: Needs assistance Equipment used: Rolling walker (2 wheeled) Transfers: Sit to/from Stand Sit to Stand: From elevated surface;Max assist         General transfer comment: Max assist for power up, steadying, hip extension to neutral. Very increased time to rise and  steady, verbal cuing for weight shift to LLE due to painful RLE. Stand attempts x5, with x1 successful stand.  Ambulation/Gait             General Gait Details: unable  Stairs            Wheelchair Mobility    Modified Rankin (Stroke Patients Only)       Balance Overall balance assessment: Needs assistance;History of Falls Sitting-balance support: Bilateral upper extremity supported;Feet supported Sitting balance-Leahy Scale: Fair       Standing balance-Leahy Scale: Zero                               Pertinent Vitals/Pain Pain Assessment: Faces Faces Pain Scale: Hurts even more Pain Location: R hip Pain Descriptors / Indicators: Discomfort;Sore Pain Intervention(s): Limited activity within patient's tolerance;Monitored during session;Repositioned    Home Living Family/patient expects to be discharged to:: Private residence Living Arrangements: Alone Available Help at Discharge: Other (Comment)(No one to assist at home. Has a fall alert system.) Type of Home: Apartment Home Access: Stairs to enter Entrance Stairs-Rails: Right Entrance Stairs-Number of Steps: 1 Home Layout: One level Home Equipment: Walker - 4 wheels;Shower seat;Walker - 2 wheels      Prior Function Level of Independence: Independent with assistive device(s)(Pt reports decline over the last month)         Comments: pt at SNF prior to this admission, states she was walking with RW in hallways, and performing ADLs without assist. Pt reports no recent falls.     Hand Dominance   Dominant  Hand: Right    Extremity/Trunk Assessment   Upper Extremity Assessment Upper Extremity Assessment: Generalized weakness    Lower Extremity Assessment Lower Extremity Assessment: Generalized weakness    Cervical / Trunk Assessment Cervical / Trunk Assessment: Normal  Communication   Communication: No difficulties  Cognition Arousal/Alertness: Awake/alert Behavior During Therapy:  WFL for tasks assessed/performed Overall Cognitive Status: Within Functional Limits for tasks assessed                                        General Comments General comments (skin integrity, edema, etc.): BP supine 126/77, BP sitting 148/97    Exercises General Exercises - Lower Extremity Long Arc Quad: AROM;Both;10 reps;Seated Heel Slides: AROM;Left;AAROM;Right;5 reps;Supine Hip ABduction/ADduction: AROM;Both;5 reps;Supine   Assessment/Plan    PT Assessment Patient needs continued PT services  PT Problem List Decreased strength;Decreased activity tolerance;Decreased balance;Decreased mobility;Decreased range of motion;Decreased coordination;Decreased knowledge of use of DME;Decreased safety awareness;Obesity;Pain       PT Treatment Interventions Gait training;Functional mobility training;Therapeutic activities;Therapeutic exercise;Balance training;Neuromuscular re-education;Patient/family education;DME instruction    PT Goals (Current goals can be found in the Care Plan section)  Acute Rehab PT Goals Patient Stated Goal: get back to walking PT Goal Formulation: With patient Time For Goal Achievement: 10/04/19 Potential to Achieve Goals: Good    Frequency Min 2X/week   Barriers to discharge Decreased caregiver support      Co-evaluation               AM-PAC PT "6 Clicks" Mobility  Outcome Measure Help needed turning from your back to your side while in a flat bed without using bedrails?: A Lot Help needed moving from lying on your back to sitting on the side of a flat bed without using bedrails?: A Lot Help needed moving to and from a bed to a chair (including a wheelchair)?: Total Help needed standing up from a chair using your arms (e.g., wheelchair or bedside chair)?: Total Help needed to walk in hospital room?: Total Help needed climbing 3-5 steps with a railing? : Total 6 Click Score: 8    End of Session Equipment Utilized During  Treatment: Gait belt Activity Tolerance: Patient tolerated treatment well Patient left: with call bell/phone within reach;in bed;with bed alarm set   PT Visit Diagnosis: Other abnormalities of gait and mobility (R26.89);Muscle weakness (generalized) (M62.81)    Time: 9784-7841 PT Time Calculation (min) (ACUTE ONLY): 32 min   Charges:   PT Evaluation $PT Eval Low Complexity: 1 Low PT Treatments $Therapeutic Activity: 8-22 mins        Elnoria Livingston E, PT Acute Rehabilitation Services Pager [phone]  Office [phone]  Claritza July D Elonda Husky 09/20/2019, 5:46 PM

## 2019-09-20 NOTE — Progress Notes (Signed)
NAME:  [name], MRN:  [id number], DOB:  28-Oct-1950, LOS: 3 ADMISSION DATE:  09/17/2019, CONSULTATION DATE:  09/18/19 REFERRING MD:  Dr. Tawanna Solo, CHIEF COMPLAINT:  Hypotension    Brief History   69 y/o F with multiple recent hospitalizations for weakness, hypotension, dehydration secondary to diarrhea and poor po intake, and recent ESBL E. Coli UTI after recent COVID in December.  Readmitted 2/23 with hypotension and dehydration.  Transferred to ICU 2/24 requiring low dose vasopressor and bicarb gtt.   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 2/24 PCCM consulted/ tx to ICU  2/25 off bicarb, remains on Neo 25 mcg/min, Afebrile, WBC stable, Hgb 10.8 -> 8.8, No stools overnight, GNR in UC thus far, ongoing poor PO intake  Consults:  PCCM   Procedures:  R Chest Port-a-Cath >>  Significant Diagnostic Tests:   ECHO 09/03/19 >> LVEF 65-70%, G1DD, no RWMA, normal RV, mild MR  CT Head 2/24 >> stable head CT, no acute intracranial process  CT Orbits 2/24 >> no evidence of traumatic or inflammatory process   Micro Data:  BCx2 2/23 >> ngtd UC 2/23 >> E. Coli >> COVID 2/23 >> negative   Antimicrobials:  Flagyl 2/23 x1 Cefepime 2/24 x1 Meropenem 2/24 >>  Vanco 2/25 >> 2/26  Interim history/subjective:  Afebrile  Off Neo as of 0300 this am  Stable H/H Only eating 20% of meals.  Struggles with nausea  Did not get OOB yesterday  No bloody stools reported- unknown bowel consistency as patient voids with BMs on bedpan   Objective   Blood pressure 114/68, pulse 92, temperature 98.2 F (36.8 C), temperature source Oral, resp. rate 19, height 5'  6" (1.676 m), weight 101.8 kg, SpO2 100 %.  Room Air        Intake/Output Summary (Last 24 hours) at 09/20/2019 4008 Last data filed at 09/20/2019 0500 Gross per 24 hour  Intake 2523.29 ml  Output 250 ml  Net 2273.29 ml   Filed Weights   09/17/19 1957 09/20/19 0500  Weight: 95 kg 101.8 kg   Examination: General:  Pale elderly frail female lying in bed in NAD HEENT: MM pink/moist, pupils 3/ reactive, dry crusting with dark pigmentation around right eye Neuro: Alert, oriented, MAE, generalized weakness CV: rr, no murmur PULM:  Room air, non labored, CTA GI: Obese, soft, NT/ ND, +bs  Extremities: warm/dry, +1 LE edema  Skin: several areas of darker pigmented dry skin   Resolved Hospital Problem list   NAGMA  Assessment & Plan:   Hypotension likely to hypovolemia +/- component of sepsis  Adrenal Insufficiency  E. Coli UTI - Suspect hypotension is multifactorial in the setting of volume depletion and relative adrenal insufficiency.  She has had prior ESBL E-Coli UTI's in the past.  Must also consider component of infection P:  Tele monitoring  Will tx to PCU later this morning if remains off Neo  Stop MIVF Continue stress dose steroids, wean as able  Continue midodrine 10 mg TID  Push PO/ maximize nutrition PCT 1.21-> 1.07-> 0.92 Follow cultures, BC negative thus far, UC with >100K of E. Coli  pending sensitives  Continue meropenum, d/c vanc    AKI on CKD IIIa  P:  sCr trending worse 1.64- 1.88- 2.08, unmeasured UOP therefore hard to interpret/ I/Os inaccurate  Consider renal ultrasound/ urine studies if sCr continues to worsen Trend renal panel Strict I/Os   Hypomagnesia P:  2 gm Mag  Repeat Mag level in am   Diarrhea P:  Unclear if any further diarrhea as stools are mixed with urine, no complaints of abd pain  Nausea (has chronic esophageal and gastric dysmotility) P: Prn reglan and zofran Ponitoring QTc  Small Left Pleural Effusion with Atelectasis P:   Remains on RA/ no resp distress  Aggressive Pulm hygiene/ PT efforts  Maximize nutrition   Hx DVT  P:  Continue eliquis, monitor for bleeding/ worsening anemia  Anemia  P:  Stable H/H, trend CBC  DMT2 P:  SSI to q4 moderate levemir 10 units BID added Will need to add TF coverage when starting TFs  Eye Hyperpigmentation   CT negative for cellulitis  Failure to thrive  Decubitus Ulcers in the setting of Severe Protein Calorie Malnutrition and Immobility  P:  magic mouth wash prn to help with mouth pain Patient has agreed to cortrak placement if she can have something for anxiety. ODT 0.125 mg  klonopin x 1.  Will attempt for post pyloric placement to help avoid nausea with chronic gastric dysmotility/ reduce aspiration risk  RD consulted  Have asked PMT to consult for [place] moving forward PT/ OT consult-  Push OOB efforts   Chronic pain/ neuropathy P: Continue gabapentin and prn tramadol   Best practice:  Diet: as tolerated, carb modified DVT prophylaxis: eliquis GI prophylaxis: PPI  Glucose control: SSI  Mobility: push/ PT  Code Status: Full Code  Family Communication: patient updated on plan of care  Disposition: tx to PCU   Labs   CBC: Recent Labs  Lab 09/17/19 1725 09/19/19 0528 09/19/19 1205 09/19/19 1722 09/20/19 0559  WBC 10.9* 11.3*  --   --  17.5*  NEUTROABS  --  9.2*  --   --   --   HGB 10.8* 8.8* 8.5* 8.3* 8.8*  HCT 31.3* 26.0* 25.0* 24.6* 26.2*  MCV 86.5 86.4  --   --  87.3  PLT 296 248  --   --  628    Basic Metabolic Panel: Recent Labs  Lab 09/17/19 1725 09/18/19 1057 09/19/19 0528 09/20/19 0559  NA 140 140 141 141  K 5.3* 4.9 4.9 4.5  CL 109 113* 110 110  CO2 20* 19* 23 23  GLUCOSE 88 118* 225* 233*  BUN 12 11 13 13   CREATININE 1.88* 1.64* 1.88* 2.08*  CALCIUM 8.3* 7.0* 7.0* 7.2*  MG  --   --   --  1.6*  PHOS  --   --   --  2.4*   GFR: Estimated Creatinine Clearance: 31.6 mL/min (A) (by C-G formula based on SCr of 2.08 mg/dL  (H)). Recent Labs  Lab 09/17/19 1725 09/17/19 1905 09/18/19 0040 09/18/19 0057 09/18/19 0359 09/19/19 0528 09/20/19 0559  PROCALCITON  --   --   --  1.21  --  1.07 0.92  WBC 10.9*  --   --   --   --  11.3* 17.5*  LATICACIDVEN 4.5* 4.0* 2.8*  --  1.7  --   --     Liver Function Tests: Recent Labs  Lab 09/17/19 1725 09/20/19 0559  AST 64*  --   ALT 26  --  ALKPHOS 190*  --   BILITOT 1.2  --   PROT 5.0*  --   ALBUMIN 1.4* 1.2*   Recent Labs  Lab 09/17/19 1725  LIPASE 10*   No results for input(s): AMMONIA in the last 168 hours.  ABG    Component Value Date/Time   PHART 7.402 (H) 01/19/2009 1611   PCO2ART 42.4 01/19/2009 1611   PO2ART 91.0 01/19/2009 1611   HCO3 25.8 (H) 01/19/2009 1611   TCO2 28 12/17/2016 1539   O2SAT 97.1 01/19/2009 1611     Coagulation Profile: No results for input(s): INR, PROTIME in the last 168 hours.  Cardiac Enzymes: No results for input(s): CKTOTAL, CKMB, CKMBINDEX, TROPONINI in the last 168 hours.  HbA1C: Hemoglobin A1c  Date/Time Value Ref Range Status  06/27/2016 11:43 AM 7.1 (H) 4.8 - 5.6 % Final    Comment:             Pre-diabetes: 5.7 - 6.4          Diabetes: >6.4          Glycemic control for adults with diabetes: <7.0    Hgb A1c MFr Bld  Date/Time Value Ref Range Status  07/27/2019 02:40 AM 8.7 (H) 4.8 - 5.6 % Final    Comment:    (NOTE) Pre diabetes:          5.7%-6.4% Diabetes:              >6.4% Glycemic control for   <7.0% adults with diabetes   05/13/2019 08:07 AM 10.0 (H) 4.8 - 5.6 % Final    Comment:    (NOTE)         Prediabetes: 5.7 - 6.4         Diabetes: >6.4         Glycemic control for adults with diabetes: <7.0     CBG: Recent Labs  Lab 09/19/19 1726 09/19/19 1949 09/19/19 2336 09/20/19 0426 09/20/19 0800  GLUCAP 256* 254* [place], MSN, AGACNP-BC [facility] 09/20/2019, 8:19 AM

## 2019-09-21 DIAGNOSIS — E46 Unspecified protein-calorie malnutrition: Secondary | ICD-10-CM

## 2019-09-21 DIAGNOSIS — E861 Hypovolemia: Secondary | ICD-10-CM | POA: Diagnosis not present

## 2019-09-21 DIAGNOSIS — E1121 Type 2 diabetes mellitus with diabetic nephropathy: Secondary | ICD-10-CM | POA: Diagnosis not present

## 2019-09-21 DIAGNOSIS — I959 Hypotension, unspecified: Secondary | ICD-10-CM | POA: Diagnosis not present

## 2019-09-21 DIAGNOSIS — Z86718 Personal history of other venous thrombosis and embolism: Secondary | ICD-10-CM

## 2019-09-21 LAB — GLUCOSE, CAPILLARY
Glucose-Capillary: 137 mg/dL — ABNORMAL HIGH (ref 70–99)
Glucose-Capillary: 92 mg/dL (ref 70–99)
Glucose-Capillary: 111 mg/dL — ABNORMAL HIGH (ref 70–99)
Glucose-Capillary: 102 mg/dL — ABNORMAL HIGH (ref 70–99)

## 2019-09-21 LAB — CBC
HCT: 24.8 % — ABNORMAL LOW (ref 36.0–46.0)
Hemoglobin: 8.3 g/dL — ABNORMAL LOW (ref 12.0–15.0)
MCH: 29.1 pg (ref 26.0–34.0)
MCHC: 33.5 g/dL (ref 30.0–36.0)
MCV: 87 fL (ref 80.0–100.0)
Platelets: 230 10*3/uL (ref 150–400)
RBC: 2.85 MIL/uL — ABNORMAL LOW (ref 3.87–5.11)
RDW: 16.6 % — ABNORMAL HIGH (ref 11.5–15.5)
WBC: 19.3 10*3/uL — ABNORMAL HIGH (ref 4.0–10.5)
nRBC: 0.3 % — ABNORMAL HIGH (ref 0.0–0.2)

## 2019-09-21 LAB — RENAL FUNCTION PANEL
Albumin: 1.2 g/dL — ABNORMAL LOW (ref 3.5–5.0)
Anion gap: 7 (ref 5–15)
BUN: 13 mg/dL (ref 8–23)
CO2: 24 mmol/L (ref 22–32)
Calcium: 7 mg/dL — ABNORMAL LOW (ref 8.9–10.3)
Chloride: 109 mmol/L (ref 98–111)
Creatinine, Ser: 1.53 mg/dL — ABNORMAL HIGH (ref 0.44–1.00)
GFR calc Af Amer: 40 mL/min — ABNORMAL LOW (ref >60)
GFR calc non Af Amer: 35 mL/min — ABNORMAL LOW (ref >60)
Glucose, Bld: 128 mg/dL — ABNORMAL HIGH (ref 70–99)
Phosphorus: 2.1 mg/dL — ABNORMAL LOW (ref 2.5–4.6)
Potassium: 3.9 mmol/L (ref 3.5–5.1)
Sodium: 140 mmol/L (ref 135–145)

## 2019-09-21 LAB — MAGNESIUM: Magnesium: 1.8 mg/dL (ref 1.7–2.4)

## 2019-09-21 MED ORDER — INSULIN ASPART 100 UNIT/ML ~~LOC~~ SOLN
0.0000 [IU] | Freq: Three times a day (TID) | SUBCUTANEOUS | Status: DC
  Administered 2019-09-22 – 2019-09-25 (×5): 2 [IU] via SUBCUTANEOUS
  Administered 2019-09-26: 3 [IU] via SUBCUTANEOUS
  Administered 2019-09-26: 2 [IU] via SUBCUTANEOUS

## 2019-09-21 MED ORDER — SODIUM PHOSPHATES 45 MMOLE/15ML IV SOLN
30.0000 mmol | Freq: Once | INTRAVENOUS | Status: AC
  Administered 2019-09-21: 30 mmol via INTRAVENOUS
  Filled 2019-09-21: qty 10

## 2019-09-21 NOTE — Progress Notes (Signed)
PROGRESS NOTE    [name]  BRA:[id number] DOB: 02-24-1951 DOA: 09/17/2019 PCP: Ann Held, DO   Brief Narrative:  Patient is a 69 year old female with history of type 2 diabetes mellitus, coronary artery disease status post stenting, recently diagnosed DVT, admitted for Covid about 2 months ago and was recently discharged from rehab to home presented to the emergency department with complaints of increased weakness, rash around her right eye.  On presentation, she was found to be hypotensive with systolic blood pressure in the range of 70s, lactic acid of 4.  Chest x-ray shows pleural effusion with possible infiltrate.  Found to have AKI.  She also had sacral decubitus stage II ulcer with no signs of active infection.  Patient was admitted for the suspicion of possible sepsis.  Started on broad-spectrum antibiotics.  Culture sent.  Despite being given 4 L of IV fluids, her blood pressure did not improve. She was transferred to ICU for pressor support, she weaned off pressors and transferred to [facility] 2/27.   Subjective:  Morning she denies any chest pain, she reports generalized weakness, she still having poor appetite, have discussed with her, reports she does not like heart healthy diet, so she will be changed to regular diet .  Assessment & Plan:   Principal Problem:   Hypotension Active Problems:   Stomach cancer (HCC)   SLEEP APNEA, OBSTRUCTIVE   Iron deficiency anemia   Type 2 diabetes with nephropathy (HCC)   CKD (chronic kidney disease) stage 3, GFR 30-59 ml/min (HCC)   History of DVT (deep vein thrombosis)   Pressure injury of skin   Goals of care, counseling/discussion   Palliative care by specialist   DNR (do not resuscitate) discussion   Shock - Multifactorial shock, hypovolemia and component of sepsis due to multidrug-resistant E. coli UTI, 0 to thrive and malnutrition. -She did require brief pressor support, currently off pressors. -+  Adrenal insufficiency, on stress dose steroids, will wean as tolerated. -Continue with midodrine. -Follow blood cultures, negative so far, urine culture growing 100,000 E. coli, sensitivities pending, continue with meropenem, vancomycin has been stopped.  Failure to thrive, severe protein calorie malnutrition -Overall she is with very poor oral intake, I have discussed with her, reports she does not like consulted food, I have changed her diet to regular diet, continue with supplement, she had eating tube, not in use yet, have discussed with her if appetite not improved in the next 24 hours will treat. -Watch closely for refeeding syndrome, already with hypophosphatemia, will replete  Right eye rash: Improving with mupirocin eye ointment   generalized weakness/ambulatory dysfunction  -PT consulted, will need SNF placement  sacral decubitus stage II ulcer:  -Present on admission, also has rash on the left lower groin from injury when transferring.  Continue supportive care.  Wound care consultation  Diabetes type 2:  - Continue sliding scale insulin.  Continue to monitor CBGs.  Hypoglycemic this morning.  Coronary artery disease:  - Status post stenting.  Denies chest pain.  Beta-blocker on hold due to low blood pressure.  She was on Plavix which was stopped at the nursing facility.  Most likely will continue it.  Normocytic anemia: - Chronic.  Continue to monitor H&H  CKD stage III/hyperkalemia: We will check BMP today.  Creatinine appears to be at baseline.  Baseline creatinine ranges from 1.6-1.7.  History of DVT: On Eliquis  History of sleep apnea: Not on  CPAP.  Recent Covid infection:  Currently respiratory status stable.  History of neuropathy: Takes tramadol  Pressure Injury 09/17/19 Sacrum Medial Stage 2 -  Partial thickness loss of dermis presenting as a shallow open injury with a red, pink wound bed without slough. (Active)  09/17/19 2307  Location: Sacrum  Location  Orientation: Medial  Staging: Stage 2 -  Partial thickness loss of dermis presenting as a shallow open injury with a red, pink wound bed without slough.  Wound Description (Comments):   Present on Admission: Yes       Nutrition Problem: Inadequate oral intake Etiology: dysphagia, early satiety, nausea      DVT prophylaxis:Eliquis Code Status: Full Family Communication: None at bdside Disposition Plan: Patient is from home.  Clinically not stable for discharge due to, generalized weakness and soft blood pressure, on IV antibiotics .  Consultants: PCCM  Procedures: None  Antimicrobials:  Anti-infectives (From admission, onward)   Start     Dose/Rate Route Frequency Ordered Stop   09/20/19 2000  Ampicillin-Sulbactam (UNASYN) 3 g in sodium chloride 0.9 % 100 mL IVPB     3 g 200 mL/hr over 30 Minutes Intravenous Every 12 hours 09/20/19 1422     09/19/19 2030  vancomycin (VANCOREADY) IVPB 1250 mg/250 mL  Status:  Discontinued     1,250 mg 166.7 mL/hr over 90 Minutes Intravenous Every 48 hours 09/17/19 2027 09/20/19 0910   09/18/19 1000  ceFEPIme (MAXIPIME) 2 g in sodium chloride 0.9 % 100 mL IVPB  Status:  Discontinued     2 g 200 mL/hr over 30 Minutes Intravenous Every 12 hours 09/18/19 0937 09/18/19 0950   09/18/19 1000  meropenem (MERREM) 1 g in sodium chloride 0.9 % 100 mL IVPB  Status:  Discontinued     1 g 200 mL/hr over 30 Minutes Intravenous Every 12 hours 09/18/19 0950 09/20/19 1422   09/18/19 0615  metroNIDAZOLE (FLAGYL) IVPB 500 mg  Status:  Discontinued     500 mg 100 mL/hr over 60 Minutes Intravenous Every 8 hours 09/18/19 0609 09/18/19 0611   09/17/19 2030  aztreonam (AZACTAM) 2 g in sodium chloride 0.9 % 100 mL IVPB  Status:  Discontinued     2 g 200 mL/hr over 30 Minutes Intravenous  Once 09/17/19 2021 09/17/19 2027   09/17/19 2030  metroNIDAZOLE (FLAGYL) IVPB 500 mg     500 mg 100 mL/hr over 60 Minutes Intravenous  Once 09/17/19 2021 09/17/19 2140   09/17/19  2030  vancomycin (VANCOCIN) IVPB 1000 mg/200 mL premix  Status:  Discontinued     1,000 mg 200 mL/hr over 60 Minutes Intravenous  Once 09/17/19 2021 09/17/19 2027   09/17/19 2030  vancomycin (VANCOREADY) IVPB 2000 mg/400 mL     2,000 mg 200 mL/hr over 120 Minutes Intravenous  Once 09/17/19 2027 09/17/19 2356   09/17/19 2030  ceFEPIme (MAXIPIME) 2 g in sodium chloride 0.9 % 100 mL IVPB  Status:  Discontinued     2 g 200 mL/hr over 30 Minutes Intravenous Every 12 hours 09/17/19 2027 09/18/19 0611      Objective: Vitals:   09/21/19 0453 09/21/19 0500 09/21/19 0837 09/21/19 1237  BP: (!) 85/47  99/66 102/62  Pulse: 66  64 (!) 59  Resp: 20  18 18   Temp: (!) 97.5 F (36.4 C)  97.8 F (36.6 C) 97.7 F (36.5 C)  TempSrc: Oral  Oral Oral  SpO2: 100%  90% 97%  Weight:  104.8 kg    Height:  Intake/Output Summary (Last 24 hours) at 09/21/2019 1552 Last data filed at 09/20/2019 2244 Gross per 24 hour  Intake 110 ml  Output --  Net 110 ml   Filed Weights   09/17/19 1957 09/20/19 0500 09/21/19 0500  Weight: 95 kg 101.8 kg 104.8 kg    Examination:  Awake Alert, Oriented X 3,frail,  No new F.N deficits, Normal affect Symmetrical Chest wall movement, Good air movement bilaterally, CTAB RRR,No Gallops,Rubs or new Murmurs, No Parasternal Heave +ve B.Sounds, Abd Soft, No tenderness, No rebound - guarding or rigidity. No Cyanosis, Clubbing or edema, No new Rash or bruise     Data Reviewed: I have personally reviewed following labs and imaging studies  CBC: Recent Labs  Lab 09/17/19 1725 09/17/19 1725 09/19/19 0528 09/19/19 1205 09/19/19 1722 09/20/19 0559 09/21/19 0520  WBC 10.9*  --  11.3*  --   --  17.5* 19.3*  NEUTROABS  --   --  9.2*  --   --   --   --   HGB 10.8*   < > 8.8* 8.5* 8.3* 8.8* 8.3*  HCT 31.3*   < > 26.0* 25.0* 24.6* 26.2* 24.8*  MCV 86.5  --  86.4  --   --  87.3 87.0  PLT 296  --  248  --   --  235 230   < > = values in this interval not displayed.    Basic Metabolic Panel: Recent Labs  Lab 09/17/19 1725 09/18/19 1057 09/19/19 0528 09/20/19 0559 09/20/19 1700 09/21/19 0520  NA 140 140 141 141  --  140  K 5.3* 4.9 4.9 4.5  --  3.9  CL 109 113* 110 110  --  109  CO2 20* 19* 23 23  --  24  GLUCOSE 88 118* 225* 233*  --  128*  BUN 12 11 13 13   --  13  CREATININE 1.88* 1.64* 1.88* 2.08*  --  1.53*  CALCIUM 8.3* 7.0* 7.0* 7.2*  --  7.0*  MG  --   --   --  1.6*  --  1.8  PHOS  --   --   --  2.4* 2.2* 2.1*   GFR: Estimated Creatinine Clearance: 43.7 mL/min (A) (by C-G formula based on SCr of 1.53 mg/dL (H)). Liver Function Tests: Recent Labs  Lab 09/17/19 1725 09/20/19 0559 09/21/19 0520  AST 64*  --   --   ALT 26  --   --   ALKPHOS 190*  --   --   BILITOT 1.2  --   --   PROT 5.0*  --   --   ALBUMIN 1.4* 1.2* 1.2*   Recent Labs  Lab 09/17/19 1725  LIPASE 10*   No results for input(s): AMMONIA in the last 168 hours. Coagulation Profile: No results for input(s): INR, PROTIME in the last 168 hours. Cardiac Enzymes: No results for input(s): CKTOTAL, CKMB, CKMBINDEX, TROPONINI in the last 168 hours. BNP (last 3 results) No results for input(s): PROBNP in the last 8760 hours. HbA1C: No results for input(s): HGBA1C in the last 72 hours. CBG: Recent Labs  Lab 09/20/19 1200 09/20/19 1637 09/20/19 2008 09/21/19 0449 09/21/19 1233  GLUCAP 174* 167* 217* 137* 92   Lipid Profile: No results for input(s): CHOL, HDL, LDLCALC, TRIG, CHOLHDL, LDLDIRECT in the last 72 hours. Thyroid Function Tests: No results for input(s): TSH, T4TOTAL, FREET4, T3FREE, THYROIDAB in the last 72 hours. Anemia Panel: No results for input(s): VITAMINB12, FOLATE, FERRITIN, TIBC, IRON,  RETICCTPCT in the last 72 hours. Sepsis Labs: Recent Labs  Lab 09/17/19 1725 09/17/19 1905 09/18/19 0040 09/18/19 0057 09/18/19 0359 09/19/19 0528 09/20/19 0559  PROCALCITON  --   --   --  1.21  --  1.07 0.92  LATICACIDVEN 4.5* 4.0* 2.8*  --  1.7  --    --     Recent Results (from the past 240 hour(s))  Blood culture (routine x 2)     Status: None (Preliminary result)   Collection Time: 09/17/19  5:10 PM   Specimen: BLOOD  Result Value Ref Range Status   Specimen Description BLOOD LEFT ANTECUBITAL  Final   Special Requests   Final    BOTTLES DRAWN AEROBIC ONLY Blood Culture results may not be optimal due to an inadequate volume of blood received in culture bottles   Culture   Final    NO GROWTH 4 DAYS Performed at [facility], [place] [address], [place], [place] [address]    Report Status PENDING  Incomplete  Blood culture (routine x 2)     Status: None (Preliminary result)   Collection Time: 09/17/19  5:25 PM   Specimen: BLOOD  Result Value Ref Range Status   Specimen Description BLOOD RIGHT ANTECUBITAL  Final   Special Requests   Final    BOTTLES DRAWN AEROBIC AND ANAEROBIC Blood Culture results may not be optimal due to an inadequate volume of blood received in culture bottles   Culture   Final    NO GROWTH 4 DAYS Performed at [facility], [place] [address], [place], [place] [address]    Report Status PENDING  Incomplete  SARS CORONAVIRUS 2 (TAT 6-24 HRS) Nasopharyngeal Nasopharyngeal Swab     Status: None   Collection Time: 09/17/19  8:43 PM   Specimen: Nasopharyngeal Swab  Result Value Ref Range Status   SARS Coronavirus 2 NEGATIVE NEGATIVE Final    Comment: (NOTE) SARS-CoV-2 target nucleic acids are NOT DETECTED. The SARS-CoV-2 RNA is generally detectable in upper and lower respiratory specimens during the acute phase of infection. Negative results do not preclude SARS-CoV-2 infection, do not rule out co-infections with other pathogens, and should not be used as the sole basis for treatment or other patient management decisions. Negative results must be combined with clinical observations, patient history, and epidemiological information. The expected result is Negative. Fact Sheet for  Patients: SugarRoll.be Fact Sheet for Healthcare Providers: https://www.woods-mathews.com/ This test is not yet approved or cleared by the [place] FDA and  has been authorized for detection and/or diagnosis of SARS-CoV-2 by FDA under an Emergency Use Authorization (EUA). This EUA will remain  in effect (meaning this test can be used) for the duration of the COVID-19 declaration under Section 56 4(b)(1) of the Act, 21 U.S.C. section 360bbb-3(b)(1), unless the authorization is terminated or revoked sooner. Performed at [facility], [place] [address] [place], [place] [address]   Culture, Urine     Status: Abnormal   Collection Time: 09/18/19 11:55 AM   Specimen: Urine, Clean Catch  Result Value Ref Range Status   Specimen Description URINE, CLEAN CATCH  Final   Special Requests   Final    NONE Performed at [facility], [place] [address] [place], [place] [address]    Culture (A)  Final    >=100,000 COLONIES/mL ESCHERICHIA COLI Confirmed Extended Spectrum Beta-Lactamase Producer (ESBL).  In bloodstream infections from ESBL organisms, carbapenems are preferred over piperacillin/tazobactam. They are shown to  have a lower risk of mortality.    Report Status 09/20/2019 FINAL  Final   Organism ID, Bacteria ESCHERICHIA COLI (A)  Final      Susceptibility   Escherichia coli - MIC*    AMPICILLIN >=32 RESISTANT Resistant     CEFAZOLIN >=64 RESISTANT Resistant     CEFTRIAXONE >=64 RESISTANT Resistant     CIPROFLOXACIN >=4 RESISTANT Resistant     GENTAMICIN <=1 SENSITIVE Sensitive     IMIPENEM <=0.25 SENSITIVE Sensitive     NITROFURANTOIN <=16 SENSITIVE Sensitive     TRIMETH/SULFA >=320 RESISTANT Resistant     AMPICILLIN/SULBACTAM 8 SENSITIVE Sensitive     PIP/TAZO <=4 SENSITIVE Sensitive     * >=100,000 COLONIES/mL ESCHERICHIA COLI         Radiology Studies: DG Abd 1 View  Result Date: 09/20/2019 CLINICAL DATA:  Feeding tube  placement EXAM: ABDOMEN - 1 VIEW COMPARISON:  Abdominal film 08/10/2019 FINDINGS: Feeding tube in the mid to distal stomach. Limited assessment of the abdomen with scattered loops of gas-filled bowel, potentially both large and small bowel, no overt signs of obstruction with limited assessment, exclusion right flank from today's examination. No acute bone finding. Potential small left effusion. IMPRESSION: 1. Feeding tube in the mid to distal stomach. 2. Potential small left effusion. Electronically Signed   By: Zetta Bills M.D.   On: 09/20/2019 10:14        Scheduled Meds: . apixaban  5 mg Oral BID  . bacitracin-polymyxin b   Right Eye TID  . Chlorhexidine Gluconate Cloth  6 each Topical Daily  . clopidogrel  75 mg Oral Daily  . feeding supplement (ENSURE ENLIVE)  237 mL Oral TID BM  . feeding supplement (PRO-STAT SUGAR FREE 64)  30 mL Per Tube BID  . gabapentin  200 mg Oral TID  . hydrocortisone sod succinate (SOLU-CORTEF) inj  50 mg Intravenous Q12H  . insulin aspart  0-15 Units Subcutaneous Q4H  . insulin detemir  10 Units Subcutaneous BID  . midodrine  10 mg Oral TID WC  . nutrition supplement (JUVEN)  1 packet Per Tube BID BM  . oxybutynin  5 mg Oral QHS  . pantoprazole  40 mg Oral Daily  . rosuvastatin  20 mg Oral q1800  . sertraline  50 mg Oral Daily  . sodium chloride flush  10-40 mL Intracatheter Q12H  . sucralfate  1 g Oral BID   Continuous Infusions: . sodium chloride Stopped (09/18/19 2212)  . ampicillin-sulbactam (UNASYN) IV 3 g (09/21/19 0818)  . feeding supplement (OSMOLITE 1.2 CAL)       LOS: 4 days         Phillips Climes, MD [facility] P2/27/2021, 3:52 PM

## 2019-09-22 LAB — GLUCOSE, CAPILLARY
Glucose-Capillary: 78 mg/dL (ref 70–99)
Glucose-Capillary: 104 mg/dL — ABNORMAL HIGH (ref 70–99)
Glucose-Capillary: 141 mg/dL — ABNORMAL HIGH (ref 70–99)
Glucose-Capillary: 81 mg/dL (ref 70–99)

## 2019-09-22 LAB — CULTURE, BLOOD (ROUTINE X 2)
Culture: NO GROWTH
Culture: NO GROWTH

## 2019-09-22 LAB — BASIC METABOLIC PANEL
Anion gap: 8 (ref 5–15)
BUN: 16 mg/dL (ref 8–23)
CO2: 25 mmol/L (ref 22–32)
Calcium: 7 mg/dL — ABNORMAL LOW (ref 8.9–10.3)
Chloride: 108 mmol/L (ref 98–111)
Creatinine, Ser: 1.42 mg/dL — ABNORMAL HIGH (ref 0.44–1.00)
GFR calc Af Amer: 44 mL/min — ABNORMAL LOW (ref >60)
GFR calc non Af Amer: 38 mL/min — ABNORMAL LOW (ref >60)
Glucose, Bld: 92 mg/dL (ref 70–99)
Potassium: 3.6 mmol/L (ref 3.5–5.1)
Sodium: 141 mmol/L (ref 135–145)

## 2019-09-22 LAB — CBC
HCT: 25.6 % — ABNORMAL LOW (ref 36.0–46.0)
Hemoglobin: 8.5 g/dL — ABNORMAL LOW (ref 12.0–15.0)
MCH: 29.4 pg (ref 26.0–34.0)
MCHC: 33.2 g/dL (ref 30.0–36.0)
MCV: 88.6 fL (ref 80.0–100.0)
Platelets: 258 10*3/uL (ref 150–400)
RBC: 2.89 MIL/uL — ABNORMAL LOW (ref 3.87–5.11)
RDW: 17.1 % — ABNORMAL HIGH (ref 11.5–15.5)
WBC: 18.6 10*3/uL — ABNORMAL HIGH (ref 4.0–10.5)
nRBC: 0.3 % — ABNORMAL HIGH (ref 0.0–0.2)

## 2019-09-22 LAB — MAGNESIUM: Magnesium: 1.8 mg/dL (ref 1.7–2.4)

## 2019-09-22 LAB — PHOSPHORUS: Phosphorus: 3.8 mg/dL (ref 2.5–4.6)

## 2019-09-22 MED ORDER — HYDROCORTISONE NA SUCCINATE PF 100 MG IJ SOLR: 50.0000 mg | Freq: Every day | INTRAMUSCULAR | Status: DC

## 2019-09-22 MED ORDER — FOSFOMYCIN TROMETHAMINE 3 G PO PACK
3.0000 g | PACK | Freq: Once | ORAL | Status: AC
  Administered 2019-09-22: 3 g via ORAL
  Filled 2019-09-22: qty 3

## 2019-09-22 NOTE — [place] FL2 (Signed)
[place] MEDICAID FL2 LEVEL OF CARE SCREENING TOOL     IDENTIFICATION  Patient Name: [name] Birthdate: [birth year] Sex: female Admission Date (Current Location): 09/17/2019  [facility] and [place] Number:  Herbalist and Address:  [facility] [place], [place] [address], [place], [place] [address]      Provider Number: [id number]  Attending Physician Name and Address:  Elgergawy, Silver Huguenin, MD  Relative Name and Phone Number:       Current Level of Care: Hospital Recommended Level of Care: [place] Prior Approval Number:    Date Approved/Denied:   PASRR Number: pending  Discharge Plan: SNF    Current Diagnoses: Patient Active Problem List   Diagnosis Date Noted  . Goals of care, counseling/discussion   . Palliative care by specialist   . DNR (do not resuscitate) discussion   . Pressure injury of skin 09/18/2019  . Elevated lactic acid level   . Sepsis due to gram-negative UTI ([place]) 09/04/2019  . Urinary tract infection due to extended-spectrum beta lactamase (ESBL) producing Escherichia coli 09/04/2019  . Acute lower UTI 09/02/2019  . History of DVT (deep vein thrombosis) 09/02/2019  . Drug-induced polyneuropathy ([place]) 08/18/2019  . FTT (failure to thrive) in adult 08/17/2019  . SOB (shortness of breath) 08/16/2019  . Lower extremity edema 08/16/2019  . Fall 08/08/2019  . Hypertension 08/08/2019  . COVID-19 virus infection 07/25/2019  . Elevated LFTs 07/25/2019  . COVID-19 07/25/2019  . Allergic contact dermatitis due to adhesives 05/23/2019  . Insomnia 05/23/2019  . SIRS (systemic inflammatory response syndrome) ([place]) 05/14/2019  . NSTEMI (non-ST elevated myocardial infarction) ([place]) 05/14/2019  . Hyperglycemia   . Neutropenia ([place])   . CAD (coronary artery disease)   . Obesity with serious comorbidity 04/16/2019  . Gastroenteritis 03/27/2019  . Falls frequently 03/27/2019  . Acute gastroenteritis 03/22/2019  .  Hematoma of scalp 03/14/2019  . Vitamin D deficiency 11/09/2018  . Muscle spasm 10/19/2018  . Chest pain 09/07/2018  . Palpitations 09/07/2018  . Other fatigue 09/07/2018  . Acute ST elevation myocardial infarction (STEMI) of inferior wall ([place]) 08/27/2018  . Atherosclerotic heart disease of native coronary artery with other forms of angina pectoris ([place]) 08/27/2018  . Acute vaginitis 08/20/2018  . Hyperlipidemia associated with type 2 diabetes mellitus ([place]) 04/09/2018  . Bronchitis 11/02/2017  . Sepsis ([place]) 11/02/2017  . Leucocytosis 11/02/2017  . Moderate persistent asthma with acute exacerbation 05/26/2017  . Influenza A 05/26/2017  . Acute pain of right shoulder 03/07/2017  . Acute bilateral low back pain without sciatica 03/07/2017  . Chronic pain 03/07/2017  . Fatty liver 03/24/2016  . Weakness 03/23/2016  . Unexplained weight loss 03/23/2016  . Melena 03/23/2016  . Hypotension 02/03/2016  . Syncope 02/03/2016  . Hypomagnesemia 02/03/2016  . Acute renal failure superimposed on stage 3 chronic kidney disease ([place]) 02/03/2016  . Gastrointestinal dysmotility 01/20/2016  . CKD (chronic kidney disease) stage 3, GFR 30-59 ml/min (HCC) 01/20/2016  . Abscess of right thigh 01/20/2016  . Chronic pain syndrome 01/15/2016  . Diarrhea 01/15/2016  . Generalized abdominal pain 01/14/2016  . Type 2 diabetes with nephropathy ([place]) 12/30/2015  . TIA (transient ischemic attack) 07/2015  . Morbid obesity due to excess calories ([place]) 05/05/2015  . Diabetic polyneuropathy associated with type 2 diabetes mellitus ([place]) 01/02/2015  . Acute bacterial sinusitis 01/02/2015  . Onychomycosis 11/04/2014  . Pain in lower limb 11/04/2014  . Multinodular goiter (nontoxic) 10/16/2014  .  Iron deficiency anemia 05/20/2014  . Major depressive disorder, single episode, moderate ([place]) 05/20/2014  . Dizziness of unknown cause 04/28/2014  . MDD (major depressive disorder), recurrent episode, moderate  ([place]) 01/28/2014  . GERD (gastroesophageal reflux disease) 01/02/2014  . Nausea 10/15/2012  . Stomach cancer ([place]) 12/10/2008  . CHRONIC RESPIRATORY FAILURE 01/03/2008  . Hyperlipidemia 06/27/2007  . OBESITY, MORBID 06/27/2007  . SLEEP APNEA, OBSTRUCTIVE 06/27/2007  . Generalized idiopathic epilepsy and epileptic syndromes, not intractable, with status epilepticus ([place]) 06/27/2007  . Asthma 06/27/2007  . OSTEOARTHRITIS  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 06/27/2007    Orientation RESPIRATION BLADDER Height & Weight     Self, Time, Situation, Place  Normal Incontinent Weight: 226 lb 6.6 oz (102.7 kg) Height:  5' 6"  (167.6 cm)  BEHAVIORAL SYMPTOMS/MOOD NEUROLOGICAL BOWEL NUTRITION STATUS      Continent Diet(see discharge summary)  AMBULATORY STATUS COMMUNICATION OF NEEDS Skin   Extensive Assist Verbally PU Stage and Appropriate Care, Other (Comment)(stage 2 PU on sacrum; MASD on groin)   PU Stage 2 Dressing: (foam dressing on sacrum)                   Personal Care Assistance Level of Assistance  Dressing, Feeding, Bathing Bathing Assistance: Maximum assistance Feeding assistance: Independent Dressing Assistance: Maximum assistance     Functional Limitations Info  Sight, Hearing, Speech Sight Info: Adequate Hearing Info: Adequate Speech Info: Adequate    SPECIAL CARE FACTORS FREQUENCY  OT (By licensed OT), PT (By licensed PT)     PT Frequency: 5x week OT Frequency: 5x week            Contractures Contractures Info: Not present    Additional Factors Info  Code Status, Allergies, Insulin Sliding Scale, Psychotropic, Isolation Precautions Code Status Info: Full Code Allergies Info: Cephalexin, Adhesive (Tape), Codeine, Zocor (Simvastatin - High Dose) Psychotropic Info: sertraline (ZOLOFT) tablet 50 mg daily PO Insulin Sliding Scale Info: insulin aspart (novoLOG) injection 0-15 Units 3x daily with meals; insulin detemir (LEVEMIR) injection 10 Units 2x daily Isolation  Precautions Info: Contact for ESBL     Current Medications (09/22/2019):  This is the current hospital active medication list Current Facility-Administered Medications  Medication Dose Route Frequency Provider Last Rate Last Admin  . 0.9 %  sodium chloride infusion  250 mL Intravenous Continuous Adhikari, Amrit, MD 20 mL/hr at 09/22/19 1104 250 mL at 09/22/19 1104  . acetaminophen (TYLENOL) tablet 650 mg  650 mg Oral Q6H PRN Rise Patience, MD   650 mg at 09/22/19 5462   Or  . acetaminophen (TYLENOL) suppository 650 mg  650 mg Rectal Q6H PRN Rise Patience, MD      . albuterol (PROVENTIL) (2.5 MG/3ML) 0.083% nebulizer solution 2.5 mg  2.5 mg Nebulization Q6H PRN Rise Patience, MD      . apixaban (ELIQUIS) tablet 5 mg  5 mg Oral BID Rise Patience, MD   5 mg at 09/22/19 1057  . bacitracin-polymyxin b (POLYSPORIN) ophthalmic ointment   Right Eye TID Shelly Coss, MD   Given at 09/22/19 1107  . Chlorhexidine Gluconate Cloth 2 % PADS 6 each  6 each Topical Daily Rise Patience, MD   6 each at 09/22/19 1107  . clopidogrel (PLAVIX) tablet 75 mg  75 mg Oral Daily Rise Patience, MD   75 mg at 09/22/19 1057  . feeding supplement (ENSURE ENLIVE) (ENSURE ENLIVE) liquid 237 mL  237 mL Oral TID BM Ollis,  Brandi L, NP   237 mL at 09/21/19 2225  . feeding supplement (OSMOLITE 1.2 CAL) liquid 1,000 mL  1,000 mL Per Tube Continuous Collene Gobble, MD      . feeding supplement (PRO-STAT SUGAR FREE 64) liquid 30 mL  30 mL Per Tube BID Collene Gobble, MD   30 mL at 09/22/19 1057  . fosfomycin (MONUROL) packet 3 g  3 g Oral Once Elgergawy, Silver Huguenin, MD      . gabapentin (NEURONTIN) capsule 200 mg  200 mg Oral TID Jennelle Human B, NP   200 mg at 09/22/19 1057  . hydrocortisone sodium succinate (SOLU-CORTEF) 100 MG injection 50 mg  50 mg Intravenous Q12H Jennelle Human B, NP   50 mg at 09/22/19 0829  . insulin aspart (novoLOG) injection 0-15 Units  0-15 Units Subcutaneous TID  WC Elgergawy, Silver Huguenin, MD      . insulin detemir (LEVEMIR) injection 10 Units  10 Units Subcutaneous BID Jennelle Human B, NP   10 Units at 09/22/19 1057  . magic mouthwash w/lidocaine  10 mL Oral TID PRN Jennelle Human B, NP   10 mL at 09/20/19 1040  . metoCLOPramide (REGLAN) tablet 5 mg  5 mg Oral Q8H PRN Jennelle Human B, NP      . midodrine (PROAMATINE) tablet 10 mg  10 mg Oral TID WC Audria Nine, DO   10 mg at 09/22/19 4098  . nutrition supplement (JUVEN) (JUVEN) powder packet 1 packet  1 packet Per Tube BID BM Collene Gobble, MD   1 packet at 09/22/19 1057  . ondansetron (ZOFRAN) tablet 4 mg  4 mg Oral Q6H PRN Jennelle Human B, NP       Or  . ondansetron (ZOFRAN) injection 4 mg  4 mg Intravenous Q6H PRN Jennelle Human B, NP   4 mg at 09/20/19 0325  . oxybutynin (DITROPAN-XL) 24 hr tablet 5 mg  5 mg Oral QHS Collene Gobble, MD   5 mg at 09/21/19 2213  . pantoprazole (PROTONIX) EC tablet 40 mg  40 mg Oral Daily Rise Patience, MD   40 mg at 09/22/19 1057  . rosuvastatin (CRESTOR) tablet 20 mg  20 mg Oral q1800 Rise Patience, MD   20 mg at 09/21/19 1705  . sertraline (ZOLOFT) tablet 50 mg  50 mg Oral Daily Rise Patience, MD   50 mg at 09/22/19 1057  . sodium chloride flush (NS) 0.9 % injection 10-40 mL  10-40 mL Intracatheter Q12H Collene Gobble, MD   10 mL at 09/22/19 1109  . sodium chloride flush (NS) 0.9 % injection 10-40 mL  10-40 mL Intracatheter PRN Collene Gobble, MD      . sucralfate (CARAFATE) tablet 1 g  1 g Oral BID Rise Patience, MD   1 g at 09/22/19 1057  . traMADol (ULTRAM) tablet 50 mg  50 mg Oral Q6H PRN Shelly Coss, MD   50 mg at 09/22/19 1191   Facility-Administered Medications Ordered in Other Encounters  Medication Dose Route Frequency Provider Last Rate Last Admin  . sodium chloride flush (NS) 0.9 % injection 10 mL  10 mL Intravenous PRN [place], Holli Humbles, NP   10 mL at 09/13/16 1146     Discharge Medications: Please see  discharge summary for a list of discharge medications.  Relevant Imaging Results:  Relevant Lab Results:   Additional Information SS#[id number] [place], [place]

## 2019-09-22 NOTE — Progress Notes (Signed)
PROGRESS NOTE    [name]  VQX:[id number] DOB: [birth year] DOA: 09/17/2019 PCP: Ann Held, DO   Brief Narrative:  Patient is a 69 year old female with history of type 2 diabetes mellitus, coronary artery disease status post stenting, recently diagnosed DVT, admitted for Covid about 2 months ago and was recently discharged from rehab to home presented to the emergency department with complaints of increased weakness, rash around her right eye.  On presentation, she was found to be hypotensive with systolic blood pressure in the range of 70s, lactic acid of 4.  Chest x-ray shows pleural effusion with possible infiltrate.  Found to have AKI.  She also had sacral decubitus stage II ulcer with no signs of active infection.  Patient was admitted for the suspicion of possible sepsis.  Started on broad-spectrum antibiotics.  Culture sent.  Despite being given 4 L of IV fluids, her blood pressure did not improve. She was transferred to ICU for pressor support, she weaned off pressors and transferred to triad hospitalist service 2/27.   Subjective:  Morning she denies any chest pain, she reports generalized weakness, her oral intake has much improved after she was changed to regular diet.  Assessment & Plan:   Principal Problem:   Hypotension Active Problems:   Stomach cancer (HCC)   SLEEP APNEA, OBSTRUCTIVE   Iron deficiency anemia   Type 2 diabetes with nephropathy (HCC)   CKD (chronic kidney disease) stage 3, GFR 30-59 ml/min (HCC)   History of DVT (deep vein thrombosis)   Pressure injury of skin   Goals of care, counseling/discussion   Palliative care by specialist   DNR (do not resuscitate) discussion   Shock - Multifactorial shock, hypovolemia and component of sepsis due to multidrug-resistant E. coli UTI, 0 to thrive and malnutrition. -She did require brief pressor support, currently off pressors. -No adrenal insufficiency, she is on hydrocortisone 50 mg V twice daily,  I will decrease to daily and monitor. -Continue with midodrine. -Follow blood cultures, negative so far. -Treated with IV meropenem x3 days for ESBL UTI.  As well received fosfomycin -Treated with Unasyn as well for presumed aspiration. -Continue to monitor off antibiotics -Worsening leukocytosis, but she is nontoxic-appearing, this is most likely in the setting of steroids -Patient started to develop some lower extremity edema, at this point I cannot start any Lasix given soft blood pressure.  Failure to thrive, severe protein calorie malnutrition -Overall she is with very poor oral intake, discussed with the patient, she does not like heart healthy diet , currently she is regular diet, her p.o. intake much improved, as well she was encouraged to drink her ensures, overall oral intake significantly improved, will discontinue her tube feeds. -  monitor closely for refeeding syndrome .Marland Kitchen  Hypophosphatemia -Repleted  Right eye rash: Improving with mupirocin eye ointment   generalized weakness/ambulatory dysfunction  -PT consulted, will need SNF placement  sacral decubitus stage II ulcer:  -Present on admission, also has rash on the left lower groin from injury when transferring.  Continue supportive care.  Wound care consultation  Diabetes type 2:  - Continue sliding scale insulin.  Continue to monitor CBGs.  Hypoglycemic this morning.  Coronary artery disease:  - Status post stenting.  Denies chest pain.  Beta-blocker on hold due to low blood pressure.  She was on Plavix which was stopped at the nursing facility.  Most likely will continue it.  Normocytic anemia: - Chronic.  Continue to monitor H&H  CKD stage  III/hyperkalemia: We will check BMP today.  Creatinine appears to be at baseline.  Baseline creatinine ranges from 1.6-1.7.  History of DVT: On Eliquis  History of sleep apnea: Not on  CPAP.  Recent Covid infection: Currently respiratory status stable.  History of  neuropathy: Takes tramadol  Pressure Injury 09/17/19 Sacrum Medial Stage 2 -  Partial thickness loss of dermis presenting as a shallow open injury with a red, pink wound bed without slough. (Active)  09/17/19 2307  Location: Sacrum  Location Orientation: Medial  Staging: Stage 2 -  Partial thickness loss of dermis presenting as a shallow open injury with a red, pink wound bed without slough.  Wound Description (Comments):   Present on Admission: Yes       Nutrition Problem: Inadequate oral intake Etiology: dysphagia, early satiety, nausea      DVT prophylaxis:Eliquis Code Status: Full Family Communication: None at bdside Disposition Plan: Patient is from home.  Clinically not stable for discharge due to, generalized weakness and soft blood pressure, on IV antibiotics .  Consultants: PCCM  Procedures: None  Antimicrobials:  Anti-infectives (From admission, onward)   Start     Dose/Rate Route Frequency Ordered Stop   09/22/19 1200  fosfomycin (MONUROL) packet 3 g     3 g Oral  Once 09/22/19 1135 09/22/19 1300   09/20/19 2000  Ampicillin-Sulbactam (UNASYN) 3 g in sodium chloride 0.9 % 100 mL IVPB  Status:  Discontinued     3 g 200 mL/hr over 30 Minutes Intravenous Every 12 hours 09/20/19 1422 09/22/19 1135   09/19/19 2030  vancomycin (VANCOREADY) IVPB 1250 mg/250 mL  Status:  Discontinued     1,250 mg 166.7 mL/hr over 90 Minutes Intravenous Every 48 hours 09/17/19 2027 09/20/19 0910   09/18/19 1000  ceFEPIme (MAXIPIME) 2 g in sodium chloride 0.9 % 100 mL IVPB  Status:  Discontinued     2 g 200 mL/hr over 30 Minutes Intravenous Every 12 hours 09/18/19 0937 09/18/19 0950   09/18/19 1000  meropenem (MERREM) 1 g in sodium chloride 0.9 % 100 mL IVPB  Status:  Discontinued     1 g 200 mL/hr over 30 Minutes Intravenous Every 12 hours 09/18/19 0950 09/20/19 1422   09/18/19 0615  metroNIDAZOLE (FLAGYL) IVPB 500 mg  Status:  Discontinued     500 mg 100 mL/hr over 60 Minutes  Intravenous Every 8 hours 09/18/19 0609 09/18/19 0611   09/17/19 2030  aztreonam (AZACTAM) 2 g in sodium chloride 0.9 % 100 mL IVPB  Status:  Discontinued     2 g 200 mL/hr over 30 Minutes Intravenous  Once 09/17/19 2021 09/17/19 2027   09/17/19 2030  metroNIDAZOLE (FLAGYL) IVPB 500 mg     500 mg 100 mL/hr over 60 Minutes Intravenous  Once 09/17/19 2021 09/17/19 2140   09/17/19 2030  vancomycin (VANCOCIN) IVPB 1000 mg/200 mL premix  Status:  Discontinued     1,000 mg 200 mL/hr over 60 Minutes Intravenous  Once 09/17/19 2021 09/17/19 2027   09/17/19 2030  vancomycin (VANCOREADY) IVPB 2000 mg/400 mL     2,000 mg 200 mL/hr over 120 Minutes Intravenous  Once 09/17/19 2027 09/17/19 2356   09/17/19 2030  ceFEPIme (MAXIPIME) 2 g in sodium chloride 0.9 % 100 mL IVPB  Status:  Discontinued     2 g 200 mL/hr over 30 Minutes Intravenous Every 12 hours 09/17/19 2027 09/18/19 0611      Objective: Vitals:   09/22/19 0407 09/22/19 0500 09/22/19 3016  09/22/19 1238  BP: (!) 104/56  (!) 96/53 97/65  Pulse: 76  (!) 59 89  Resp: 18  19 19   Temp: (!) 97.4 F (36.3 C)  97.6 F (36.4 C) 97.9 F (36.6 C)  TempSrc: Oral  Oral Oral  SpO2: 95%  98% 92%  Weight:  102.7 kg    Height:        Intake/Output Summary (Last 24 hours) at 09/22/2019 1420 Last data filed at 09/22/2019 0600 Gross per 24 hour  Intake 353.43 ml  Output --  Net 353.43 ml   Filed Weights   09/20/19 0500 09/21/19 0500 09/22/19 0500  Weight: 101.8 kg 104.8 kg 102.7 kg    Examination:  Awake Alert, Oriented X 3, No new F.N deficits, Normal affect Symmetrical Chest wall movement, Good air movement bilaterally, CTAB RRR,No Gallops,Rubs or new Murmurs, No Parasternal Heave +ve B.Sounds, Abd Soft, No tenderness, No rebound - guarding or rigidity. No Cyanosis, Clubbing +1 edema, No new Rash or bruise     Data Reviewed: I have personally reviewed following labs and imaging studies  CBC: Recent Labs  Lab 09/17/19 1725  09/17/19 1725 09/19/19 0528 09/19/19 0528 09/19/19 1205 09/19/19 1722 09/20/19 0559 09/21/19 0520 09/22/19 0425  WBC 10.9*  --  11.3*  --   --   --  17.5* 19.3* 18.6*  NEUTROABS  --   --  9.2*  --   --   --   --   --   --   HGB 10.8*   < > 8.8*   < > 8.5* 8.3* 8.8* 8.3* 8.5*  HCT 31.3*   < > 26.0*   < > 25.0* 24.6* 26.2* 24.8* 25.6*  MCV 86.5  --  86.4  --   --   --  87.3 87.0 88.6  PLT 296  --  248  --   --   --  235 230 258   < > = values in this interval not displayed.   Basic Metabolic Panel: Recent Labs  Lab 09/18/19 1057 09/19/19 0528 09/20/19 0559 09/20/19 1700 09/21/19 0520 09/22/19 0425  NA 140 141 141  --  140 141  K 4.9 4.9 4.5  --  3.9 3.6  CL 113* 110 110  --  109 108  CO2 19* 23 23  --  24 25  GLUCOSE 118* 225* 233*  --  128* 92  BUN 11 13 13   --  13 16  CREATININE 1.64* 1.88* 2.08*  --  1.53* 1.42*  CALCIUM 7.0* 7.0* 7.2*  --  7.0* 7.0*  MG  --   --  1.6*  --  1.8 1.8  PHOS  --   --  2.4* 2.2* 2.1* 3.8   GFR: Estimated Creatinine Clearance: 46.5 mL/min (A) (by C-G formula based on SCr of 1.42 mg/dL (H)). Liver Function Tests: Recent Labs  Lab 09/17/19 1725 09/20/19 0559 09/21/19 0520  AST 64*  --   --   ALT 26  --   --   ALKPHOS 190*  --   --   BILITOT 1.2  --   --   PROT 5.0*  --   --   ALBUMIN 1.4* 1.2* 1.2*   Recent Labs  Lab 09/17/19 1725  LIPASE 10*   No results for input(s): AMMONIA in the last 168 hours. Coagulation Profile: No results for input(s): INR, PROTIME in the last 168 hours. Cardiac Enzymes: No results for input(s): CKTOTAL, CKMB, CKMBINDEX, TROPONINI in the last 168 hours.  BNP (last 3 results) No results for input(s): PROBNP in the last 8760 hours. HbA1C: No results for input(s): HGBA1C in the last 72 hours. CBG: Recent Labs  Lab 09/21/19 1233 09/21/19 1627 09/21/19 2130 09/22/19 0619 09/22/19 1234  GLUCAP 92 111* 102* 78 104*   Lipid Profile: No results for input(s): CHOL, HDL, LDLCALC, TRIG, CHOLHDL,  LDLDIRECT in the last 72 hours. Thyroid Function Tests: No results for input(s): TSH, T4TOTAL, FREET4, T3FREE, THYROIDAB in the last 72 hours. Anemia Panel: No results for input(s): VITAMINB12, FOLATE, FERRITIN, TIBC, IRON, RETICCTPCT in the last 72 hours. Sepsis Labs: Recent Labs  Lab 09/17/19 1725 09/17/19 1905 09/18/19 0040 09/18/19 0057 09/18/19 0359 09/19/19 0528 09/20/19 0559  PROCALCITON  --   --   --  1.21  --  1.07 0.92  LATICACIDVEN 4.5* 4.0* 2.8*  --  1.7  --   --     Recent Results (from the past 240 hour(s))  Blood culture (routine x 2)     Status: None   Collection Time: 09/17/19  5:10 PM   Specimen: BLOOD  Result Value Ref Range Status   Specimen Description BLOOD LEFT ANTECUBITAL  Final   Special Requests   Final    BOTTLES DRAWN AEROBIC ONLY Blood Culture results may not be optimal due to an inadequate volume of blood received in culture bottles   Culture   Final    NO GROWTH 5 DAYS Performed at [facility], [place] [address], [place], [place] [address]    Report Status 09/22/2019 FINAL  Final  Blood culture (routine x 2)     Status: None   Collection Time: 09/17/19  5:25 PM   Specimen: BLOOD  Result Value Ref Range Status   Specimen Description BLOOD RIGHT ANTECUBITAL  Final   Special Requests   Final    BOTTLES DRAWN AEROBIC AND ANAEROBIC Blood Culture results may not be optimal due to an inadequate volume of blood received in culture bottles   Culture   Final    NO GROWTH 5 DAYS Performed at [facility], [place] [address] [place], [place] [address]    Report Status 09/22/2019 FINAL  Final  SARS CORONAVIRUS 2 (TAT 6-24 HRS) Nasopharyngeal Nasopharyngeal Swab     Status: None   Collection Time: 09/17/19  8:43 PM   Specimen: Nasopharyngeal Swab  Result Value Ref Range Status   SARS Coronavirus 2 NEGATIVE NEGATIVE Final    Comment: (NOTE) SARS-CoV-2 target nucleic acids are NOT DETECTED. The SARS-CoV-2 RNA is generally detectable in upper  and lower respiratory specimens during the acute phase of infection. Negative results do not preclude SARS-CoV-2 infection, do not rule out co-infections with other pathogens, and should not be used as the sole basis for treatment or other patient management decisions. Negative results must be combined with clinical observations, patient history, and epidemiological information. The expected result is Negative. Fact Sheet for Patients: SugarRoll.be Fact Sheet for Healthcare Providers: https://www.woods-mathews.com/ This test is not yet approved or cleared by the [place] FDA and  has been authorized for detection and/or diagnosis of SARS-CoV-2 by FDA under an Emergency Use Authorization (EUA). This EUA will remain  in effect (meaning this test can be used) for the duration of the COVID-19 declaration under Section 56 4(b)(1) of the Act, 21 U.S.C. section 360bbb-3(b)(1), unless the authorization is terminated or revoked sooner. Performed at [facility], [place] [address] [place], [place] [address]   Culture, Urine  Status: Abnormal   Collection Time: 09/18/19 11:55 AM   Specimen: Urine, Clean Catch  Result Value Ref Range Status   Specimen Description URINE, CLEAN CATCH  Final   Special Requests   Final    NONE Performed at [facility], [address] [place], [place] [address]    Culture (A)  Final    >=100,000 COLONIES/mL ESCHERICHIA COLI Confirmed Extended Spectrum Beta-Lactamase Producer (ESBL).  In bloodstream infections from ESBL organisms, carbapenems are preferred over piperacillin/tazobactam. They are shown to have a lower risk of mortality.    Report Status 09/20/2019 FINAL  Final   Organism ID, Bacteria ESCHERICHIA COLI (A)  Final      Susceptibility   Escherichia coli - MIC*    AMPICILLIN >=32 RESISTANT Resistant     CEFAZOLIN >=64 RESISTANT Resistant     CEFTRIAXONE >=64 RESISTANT Resistant      CIPROFLOXACIN >=4 RESISTANT Resistant     GENTAMICIN <=1 SENSITIVE Sensitive     IMIPENEM <=0.25 SENSITIVE Sensitive     NITROFURANTOIN <=16 SENSITIVE Sensitive     TRIMETH/SULFA >=320 RESISTANT Resistant     AMPICILLIN/SULBACTAM 8 SENSITIVE Sensitive     PIP/TAZO <=4 SENSITIVE Sensitive     * >=100,000 COLONIES/mL ESCHERICHIA COLI         Radiology Studies: No results found.      Scheduled Meds: . apixaban  5 mg Oral BID  . bacitracin-polymyxin b   Right Eye TID  . Chlorhexidine Gluconate Cloth  6 each Topical Daily  . clopidogrel  75 mg Oral Daily  . feeding supplement (ENSURE ENLIVE)  237 mL Oral TID BM  . feeding supplement (PRO-STAT SUGAR FREE 64)  30 mL Per Tube BID  . gabapentin  200 mg Oral TID  . hydrocortisone sod succinate (SOLU-CORTEF) inj  50 mg Intravenous Q12H  . insulin aspart  0-15 Units Subcutaneous TID WC  . insulin detemir  10 Units Subcutaneous BID  . midodrine  10 mg Oral TID WC  . nutrition supplement (JUVEN)  1 packet Per Tube BID BM  . oxybutynin  5 mg Oral QHS  . pantoprazole  40 mg Oral Daily  . rosuvastatin  20 mg Oral q1800  . sertraline  50 mg Oral Daily  . sodium chloride flush  10-40 mL Intracatheter Q12H  . sucralfate  1 g Oral BID   Continuous Infusions: . sodium chloride 250 mL (09/22/19 1104)  . feeding supplement (OSMOLITE 1.2 CAL)       LOS: 5 days     Phillips Climes, MD [facility] P2/28/2021, 2:20 PM

## 2019-09-23 LAB — CBC
HCT: 26.2 % — ABNORMAL LOW (ref 36.0–46.0)
Hemoglobin: 9 g/dL — ABNORMAL LOW (ref 12.0–15.0)
MCH: 29.6 pg (ref 26.0–34.0)
MCHC: 34.4 g/dL (ref 30.0–36.0)
MCV: 86.2 fL (ref 80.0–100.0)
Platelets: 309 10*3/uL (ref 150–400)
RBC: 3.04 MIL/uL — ABNORMAL LOW (ref 3.87–5.11)
RDW: 16.9 % — ABNORMAL HIGH (ref 11.5–15.5)
WBC: 19.3 10*3/uL — ABNORMAL HIGH (ref 4.0–10.5)
nRBC: 0.3 % — ABNORMAL HIGH (ref 0.0–0.2)

## 2019-09-23 LAB — BASIC METABOLIC PANEL
Anion gap: 8 (ref 5–15)
BUN: 19 mg/dL (ref 8–23)
CO2: 26 mmol/L (ref 22–32)
Calcium: 7.4 mg/dL — ABNORMAL LOW (ref 8.9–10.3)
Chloride: 109 mmol/L (ref 98–111)
Creatinine, Ser: 1.07 mg/dL — ABNORMAL HIGH (ref 0.44–1.00)
GFR calc Af Amer: >60 mL/min (ref >60)
GFR calc non Af Amer: 54 mL/min — ABNORMAL LOW (ref >60)
Glucose, Bld: 67 mg/dL — ABNORMAL LOW (ref 70–99)
Potassium: 3.2 mmol/L — ABNORMAL LOW (ref 3.5–5.1)
Sodium: 143 mmol/L (ref 135–145)

## 2019-09-23 LAB — GLUCOSE, CAPILLARY
Glucose-Capillary: 81 mg/dL (ref 70–99)
Glucose-Capillary: 61 mg/dL — ABNORMAL LOW (ref 70–99)
Glucose-Capillary: 83 mg/dL (ref 70–99)
Glucose-Capillary: 59 mg/dL — ABNORMAL LOW (ref 70–99)
Glucose-Capillary: 88 mg/dL (ref 70–99)
Glucose-Capillary: 97 mg/dL (ref 70–99)
Glucose-Capillary: 84 mg/dL (ref 70–99)
Glucose-Capillary: 83 mg/dL (ref 70–99)

## 2019-09-23 LAB — MAGNESIUM: Magnesium: 2.1 mg/dL (ref 1.7–2.4)

## 2019-09-23 LAB — PHOSPHORUS: Phosphorus: 2.8 mg/dL (ref 2.5–4.6)

## 2019-09-23 MED ORDER — METOPROLOL TARTRATE 5 MG/5ML IV SOLN
INTRAVENOUS | Status: AC
  Filled 2019-09-23: qty 5

## 2019-09-23 MED ORDER — DILTIAZEM HCL 30 MG PO TABS
30.0000 mg | ORAL_TABLET | Freq: Three times a day (TID) | ORAL | Status: DC
  Administered 2019-09-23 – 2019-09-24 (×3): 30 mg via ORAL
  Filled 2019-09-23 (×3): qty 1

## 2019-09-23 MED ORDER — INSULIN DETEMIR 100 UNIT/ML ~~LOC~~ SOLN
6.0000 [IU] | Freq: Two times a day (BID) | SUBCUTANEOUS | Status: DC
  Administered 2019-09-24: 6 [IU] via SUBCUTANEOUS
  Filled 2019-09-23 (×6): qty 0.06

## 2019-09-23 MED ORDER — METOPROLOL TARTRATE 5 MG/5ML IV SOLN
5.0000 mg | Freq: Once | INTRAVENOUS | Status: AC
  Administered 2019-09-23: 5 mg via INTRAVENOUS

## 2019-09-23 NOTE — Evaluation (Signed)
Occupational Therapy Evaluation Patient Details Name: [name] MRN: [id number] DOB: 05/02/1951 Today's Date: 09/23/2019    History of Present Illness 69 y.o. female with past medical history significant for DM2, HLD, Asthma who presented to the ED on 2/23 with shock secondary to hypovolemia, adrenal insufficiency, renal insufficiency, and Ecoli UTI. Pt with recent stay at SNF, d/c home for 1 day and readmitted. Pt with recent [facility] and [facility] admission for covid 06/2019.   Clinical Impression   PTA, pt was at SNF for rehab (dc to home for one day) and was performing BADLs and mobility with RW and assistance until recent decline in function with further weakness. Pt currently requiring Min A for UB ADLs, Max A for LB ADLs, and Min-Mod A for bed mobility. Once sitting at EOB, pt reporting light headedness and fatigue; HR 140s-150s and BP 92/70. Pt participating in BUE/BLE exercises at EOB and BP elevating. Pt very motivated to transfer to recliner. Pt attempting sit<>stand requiring Max A but HR elevating to 190s. Returned pt to supine and HR quickly decreased to 110s in supine. Throughout session, pt sad and periodically tearful stating "I just want to go home." Benefiting from music and calm cues. Pending pt progress and safety with transfers, recommend dc to home with [place], [place] aide, and supervision from family. Pt will require further acute OT to facilitate safe dc.   Orthostatic BPs   Supine 103/60  Sitting at EOB 92/70  Sitting at EOB after exericses 117/74  Supine 127/79      Follow Up Recommendations  Supervision/Assistance - 24 hour;Home health OT(HH aide; Pending pt progress)    Equipment Recommendations  3 in 1 bedside commode;Wheelchair (measurements OT);Wheelchair cushion (measurements OT)    Recommendations for Other Services PT consult     Precautions / Restrictions Precautions Precautions: Fall Restrictions Weight Bearing Restrictions: No      Mobility Bed  Mobility Overal bed mobility: Needs Assistance Bed Mobility: Supine to Sit;Sit to Supine;Rolling Rolling: Min guard   Supine to sit: Min assist;HOB elevated Sit to supine: Mod assist   General bed mobility comments: Pt requiring Min A for elevate trunk into sitting. Mod A for assisting BLEs to return to supine.   Transfers Overall transfer level: Needs assistance Equipment used: Rolling walker (2 wheeled) Transfers: Sit to/from Stand Sit to Stand: Max assist;From elevated surface         General transfer comment: Max A to attempt to power up into standing. Pt very fatigued and HR elevating to 190s. Returned quickly to supine and HR returned to 110    Balance Overall balance assessment: Needs assistance;History of Falls Sitting-balance support: Bilateral upper extremity supported;Feet supported Sitting balance-Leahy Scale: Fair     Standing balance support: During functional activity;Bilateral upper extremity supported Standing balance-Leahy Scale: Zero                             ADL either performed or assessed with clinical judgement   ADL Overall ADL's : Needs assistance/impaired Eating/Feeding: Independent;Bed level   Grooming: Set up;Supervision/safety;Sitting;Wash/dry hands;Bed level   Upper Body Bathing: Minimal assistance;Sitting;Bed level   Lower Body Bathing: Maximal assistance;Bed level;Sitting/lateral leans   Upper Body Dressing : Minimal assistance;Sitting;Bed level   Lower Body Dressing: Maximal assistance;Bed level Lower Body Dressing Details (indicate cue type and reason): Max A for donning/dopffing socks              Functional mobility during ADLs:  Rolling walker;Maximal assistance(attempt to stand) General ADL Comments: Limited by HR and BP     Vision Baseline Vision/History: Wears glasses Wears Glasses: Reading only Patient Visual Report: No change from baseline       Perception     Praxis      Pertinent Vitals/Pain  Pain Assessment: Faces Faces Pain Scale: Hurts even more Pain Location: R hip Pain Descriptors / Indicators: Discomfort;Sore Pain Intervention(s): Monitored during session;Limited activity within patient's tolerance;Repositioned     Hand Dominance Right   Extremity/Trunk Assessment Upper Extremity Assessment Upper Extremity Assessment: Generalized weakness   Lower Extremity Assessment Lower Extremity Assessment: Generalized weakness   Cervical / Trunk Assessment Cervical / Trunk Assessment: Normal Cervical / Trunk Exceptions: forward flexion of neck and upper back   Communication Communication Communication: No difficulties   Cognition Arousal/Alertness: Awake/alert Behavior During Therapy: Flat affect Overall Cognitive Status: Within Functional Limits for tasks assessed                                 General Comments: Pt sad and stating she wants to go home. Moments of quiet crying.   General Comments  BP supine 103/60. sitting EOB 92/70. 117/74. 127/79. HR 140-150s sitting. 194 in standing. quickly return to 110s in supine    Exercises Exercises: General Upper Extremity;General Lower Extremity General Exercises - Upper Extremity Elbow Flexion: AROM;Both;10 reps;Seated Elbow Extension: AROM;Both;10 reps;Seated General Exercises - Lower Extremity Long Arc Quad: AROM;Both;10 reps;Seated   Shoulder Instructions      Home Living Family/patient expects to be discharged to:: Private residence Living Arrangements: Alone Available Help at Discharge: Other (Comment)(No one to assist at home. Has a fall alert system.) Type of Home: Apartment Home Access: Stairs to enter Entrance Stairs-Number of Steps: 1 Entrance Stairs-Rails: Right Home Layout: One level     Bathroom Shower/Tub: Teacher, early years/pre: Standard     Home Equipment: Environmental consultant - 4 wheels;Shower seat;Walker - 2 wheels   Additional Comments: Pt reporting that her children (three  daughters and one son) are planning to organize for 24/7 care so she can dc to home instead of SNF      Prior Functioning/Environment Level of Independence: Independent with assistive device(s)(Pt reports decline over the last month)        Comments: Pt reporting she was using RW at [facility] and performing BADLs. However, reports she got very weak at the end of her stay at [facility].         OT Problem List: Decreased strength;Decreased range of motion;Decreased activity tolerance;Impaired balance (sitting and/or standing);Cardiopulmonary status limiting activity      OT Treatment/Interventions: Self-care/ADL training;Therapeutic exercise;Neuromuscular education;Energy conservation;DME and/or AE instruction;Therapeutic activities;Patient/family education;Balance training    OT Goals(Current goals can be found in the care plan section) Acute Rehab OT Goals Patient Stated Goal: "I just want to go home" OT Goal Formulation: With patient Time For Goal Achievement: 10/07/19 Potential to Achieve Goals: Good  OT Frequency: Min 3X/week   Barriers to D/C:            Co-evaluation              AM-PAC OT "6 Clicks" Daily Activity     Outcome Measure Help from another person eating meals?: None Help from another person taking care of personal grooming?: A Little Help from another person toileting, which includes using toliet, bedpan, or urinal?: A Little Help from another person  bathing (including washing, rinsing, drying)?: A Little Help from another person to put on and taking off regular upper body clothing?: A Little Help from another person to put on and taking off regular lower body clothing?: A Little 6 Click Score: 19   End of Session Equipment Utilized During Treatment: Rolling walker;Gait belt Nurse Communication: Mobility status  Activity Tolerance: Patient limited by fatigue(Limited by BP and HR') Patient left: with call bell/phone within reach;in bed;with bed alarm set  OT  Visit Diagnosis: Unsteadiness on feet (R26.81);Muscle weakness (generalized) (M62.81)                Time: 9093-1121 OT Time Calculation (min): 37 min Charges:  OT General Charges $OT Visit: 1 Visit OT Evaluation $OT Eval Moderate Complexity: 1 Mod OT Treatments $Self Care/Home Management : 8-22 mins  Nikkol Pai MSOT, OTR/L Acute Rehab Pager: [phone] Office: [place] 09/23/2019, 10:05 AM

## 2019-09-23 NOTE — Progress Notes (Signed)
PROGRESS NOTE    [name]  HYI:[id number] DOB: 03/04/1951 DOA: 09/17/2019 PCP: Ann Held, DO   Brief Narrative:  Patient is a 69 year old female with history of type 2 diabetes mellitus, coronary artery disease status post stenting, recently diagnosed DVT, admitted for Covid about 2 months ago and was recently discharged from rehab to home presented to the emergency department with complaints of increased weakness, rash around her right eye.  On presentation, she was found to be hypotensive with systolic blood pressure in the range of 70s, lactic acid of 4.  Chest x-ray shows pleural effusion with possible infiltrate.  Found to have AKI.  She also had sacral decubitus stage II ulcer with no signs of active infection.  Patient was admitted for the suspicion of possible sepsis.  Started on broad-spectrum antibiotics.  Culture sent.  Despite being given 4 L of IV fluids, her blood pressure did not improve. She was transferred to ICU for pressor support, she weaned off pressors and transferred to triad hospitalist service 2/27.   Subjective:  Morning she denies any chest pain, she reports generalized weakness, oral intake has improved with regular diet, she has significant tachycardia while working with PT.  Assessment & Plan:   Principal Problem:   Hypotension Active Problems:   Stomach cancer (HCC)   SLEEP APNEA, OBSTRUCTIVE   Iron deficiency anemia   Type 2 diabetes with nephropathy (HCC)   CKD (chronic kidney disease) stage 3, GFR 30-59 ml/min (HCC)   History of DVT (deep vein thrombosis)   Pressure injury of skin   Goals of care, counseling/discussion   Palliative care by specialist   DNR (do not resuscitate) discussion   Shock - Multifactorial shock, hypovolemia and component of sepsis due to multidrug-resistant E. coli UTI, 0 to thrive and malnutrition. -She did require brief pressor support, currently off pressors. -Continue with midodrine. -Follow blood  cultures, negative so far. -Treated with IV meropenem x3 days for ESBL UTI.  As well received fosfomycin -Treated with Unasyn as well for presumed aspiration. -Continue to monitor off antibiotics -Worsening leukocytosis, but she is nontoxic-appearing, this is most likely in the setting of steroids -Patient started to develop some lower extremity edema, at this point I cannot start any Lasix given soft blood pressure. -There was question about adrenal insufficiency, told her cortisol levels were within normal range, she does have soft blood pressure, it does appear that has been chronic problem over previous multiple admissions in the past, will go ahead and hold her IV hydrocortisone today to see how she is doing. Patient with significant tachycardia with minimal activity today, I will obtain EKG.   Failure to thrive, severe protein calorie malnutrition -Overall she is with very poor oral intake, discussed with the patient, she does not like heart healthy diet , currently she is regular diet, her p.o. intake much improved, as well she was encouraged to drink her ensures, overall oral intake significantly improved, will discontinue her tube feeds. -  monitor closely for refeeding syndrome .Marland Kitchen  Hypophosphatemia -Repleted  Right eye rash: Improving with mupirocin eye ointment   generalized weakness/ambulatory dysfunction  -PT consulted, will need SNF placement  sacral decubitus stage II ulcer:  -Present on admission, also has rash on the left lower groin from injury when transferring.  Continue supportive care.  Wound care consultation  Diabetes type 2:  - Continue sliding scale insulin.  Continue to monitor CBGs.  Hypoglycemic this morning.  Coronary artery disease:  - Status  post stenting.  Denies chest pain.  Beta-blocker on hold due to low blood pressure.  She was on Plavix which was stopped at the nursing facility.  Most likely will continue it.  Normocytic anemia: - Chronic.   Continue to monitor H&H  CKD stage III/hyperkalemia: We will check BMP today.  Creatinine appears to be at baseline.  Baseline creatinine ranges from 1.6-1.7.  History of DVT: On Eliquis  History of sleep apnea: Not on  CPAP.  Recent Covid infection: Currently respiratory status stable.  History of neuropathy: Takes tramadol  Pressure Injury 09/17/19 Sacrum Medial Stage 2 -  Partial thickness loss of dermis presenting as a shallow open injury with a red, pink wound bed without slough. (Active)  09/17/19 2307  Location: Sacrum  Location Orientation: Medial  Staging: Stage 2 -  Partial thickness loss of dermis presenting as a shallow open injury with a red, pink wound bed without slough.  Wound Description (Comments):   Present on Admission: Yes       Nutrition Problem: Inadequate oral intake Etiology: dysphagia, early satiety, nausea      DVT prophylaxis:Eliquis Code Status: Full Family Communication: None at bdside Disposition Plan: Likely will need SNF, still medically unstable for discharge given significant tachycardia with minimal activity, and soft blood pressure .  Consultants: PCCM  Procedures: None  Antimicrobials:  Anti-infectives (From admission, onward)   Start     Dose/Rate Route Frequency Ordered Stop   09/22/19 1200  fosfomycin (MONUROL) packet 3 g     3 g Oral  Once 09/22/19 1135 09/22/19 1300   09/20/19 2000  Ampicillin-Sulbactam (UNASYN) 3 g in sodium chloride 0.9 % 100 mL IVPB  Status:  Discontinued     3 g 200 mL/hr over 30 Minutes Intravenous Every 12 hours 09/20/19 1422 09/22/19 1135   09/19/19 2030  vancomycin (VANCOREADY) IVPB 1250 mg/250 mL  Status:  Discontinued     1,250 mg 166.7 mL/hr over 90 Minutes Intravenous Every 48 hours 09/17/19 2027 09/20/19 0910   09/18/19 1000  ceFEPIme (MAXIPIME) 2 g in sodium chloride 0.9 % 100 mL IVPB  Status:  Discontinued     2 g 200 mL/hr over 30 Minutes Intravenous Every 12 hours 09/18/19 0937 09/18/19  0950   09/18/19 1000  meropenem (MERREM) 1 g in sodium chloride 0.9 % 100 mL IVPB  Status:  Discontinued     1 g 200 mL/hr over 30 Minutes Intravenous Every 12 hours 09/18/19 0950 09/20/19 1422   09/18/19 0615  metroNIDAZOLE (FLAGYL) IVPB 500 mg  Status:  Discontinued     500 mg 100 mL/hr over 60 Minutes Intravenous Every 8 hours 09/18/19 0609 09/18/19 0611   09/17/19 2030  aztreonam (AZACTAM) 2 g in sodium chloride 0.9 % 100 mL IVPB  Status:  Discontinued     2 g 200 mL/hr over 30 Minutes Intravenous  Once 09/17/19 2021 09/17/19 2027   09/17/19 2030  metroNIDAZOLE (FLAGYL) IVPB 500 mg     500 mg 100 mL/hr over 60 Minutes Intravenous  Once 09/17/19 2021 09/17/19 2140   09/17/19 2030  vancomycin (VANCOCIN) IVPB 1000 mg/200 mL premix  Status:  Discontinued     1,000 mg 200 mL/hr over 60 Minutes Intravenous  Once 09/17/19 2021 09/17/19 2027   09/17/19 2030  vancomycin (VANCOREADY) IVPB 2000 mg/400 mL     2,000 mg 200 mL/hr over 120 Minutes Intravenous  Once 09/17/19 2027 09/17/19 2356   09/17/19 2030  ceFEPIme (MAXIPIME) 2 g in sodium  chloride 0.9 % 100 mL IVPB  Status:  Discontinued     2 g 200 mL/hr over 30 Minutes Intravenous Every 12 hours 09/17/19 2027 09/18/19 0611      Objective: Vitals:   09/23/19 0841 09/23/19 0900 09/23/19 1248 09/23/19 1606  BP: 108/79  107/67 (!) 114/50  Pulse: (!) 126 (!) 105 95 (!) 114  Resp:      Temp: 98 F (36.7 C)  98.8 F (37.1 C) 100.2 F (37.9 C)  TempSrc: Oral  Oral Oral  SpO2: 95%  100% 96%  Weight:      Height:       No intake or output data in the 24 hours ending 09/23/19 1623 Filed Weights   09/20/19 0500 09/21/19 0500 09/22/19 0500  Weight: 101.8 kg 104.8 kg 102.7 kg    Examination:  Awake Alert, Oriented X 3, No new F.N deficits, Normal affect Symmetrical Chest wall movement, Good air movement bilaterally, CTAB RRR,No Gallops,Rubs or new Murmurs, No Parasternal Heave +ve B.Sounds, Abd Soft, No tenderness, No rebound -  guarding or rigidity. No Cyanosis, Clubbing ,+1 edema, No new Rash or bruise      Data Reviewed: I have personally reviewed following labs and imaging studies  CBC: Recent Labs  Lab 09/19/19 0528 09/19/19 1205 09/19/19 1722 09/20/19 0559 09/21/19 0520 09/22/19 0425 09/23/19 0453  WBC 11.3*  --   --  17.5* 19.3* 18.6* 19.3*  NEUTROABS 9.2*  --   --   --   --   --   --   HGB 8.8*   < > 8.3* 8.8* 8.3* 8.5* 9.0*  HCT 26.0*   < > 24.6* 26.2* 24.8* 25.6* 26.2*  MCV 86.4  --   --  87.3 87.0 88.6 86.2  PLT 248  --   --  235 230 258 309   < > = values in this interval not displayed.   Basic Metabolic Panel: Recent Labs  Lab 09/19/19 0528 09/20/19 0559 09/20/19 1700 09/21/19 0520 09/22/19 0425 09/23/19 0453  NA 141 141  --  140 141 143  K 4.9 4.5  --  3.9 3.6 3.2*  CL 110 110  --  109 108 109  CO2 23 23  --  24 25 26   GLUCOSE 225* 233*  --  128* 92 67*  BUN 13 13  --  13 16 19   CREATININE 1.88* 2.08*  --  1.53* 1.42* 1.07*  CALCIUM 7.0* 7.2*  --  7.0* 7.0* 7.4*  MG  --  1.6*  --  1.8 1.8 2.1  PHOS  --  2.4* 2.2* 2.1* 3.8 2.8   GFR: Estimated Creatinine Clearance: 61.8 mL/min (A) (by C-G formula based on SCr of 1.07 mg/dL (H)). Liver Function Tests: Recent Labs  Lab 09/17/19 1725 09/20/19 0559 09/21/19 0520  AST 64*  --   --   ALT 26  --   --   ALKPHOS 190*  --   --   BILITOT 1.2  --   --   PROT 5.0*  --   --   ALBUMIN 1.4* 1.2* 1.2*   Recent Labs  Lab 09/17/19 1725  LIPASE 10*   No results for input(s): AMMONIA in the last 168 hours. Coagulation Profile: No results for input(s): INR, PROTIME in the last 168 hours. Cardiac Enzymes: No results for input(s): CKTOTAL, CKMB, CKMBINDEX, TROPONINI in the last 168 hours. BNP (last 3 results) No results for input(s): PROBNP in the last 8760 hours. HbA1C: No results for  input(s): HGBA1C in the last 72 hours. CBG: Recent Labs  Lab 09/23/19 0512 09/23/19 0902 09/23/19 1038 09/23/19 1248 09/23/19 1605   GLUCAP 83 59* 88 97 84   Lipid Profile: No results for input(s): CHOL, HDL, LDLCALC, TRIG, CHOLHDL, LDLDIRECT in the last 72 hours. Thyroid Function Tests: No results for input(s): TSH, T4TOTAL, FREET4, T3FREE, THYROIDAB in the last 72 hours. Anemia Panel: No results for input(s): VITAMINB12, FOLATE, FERRITIN, TIBC, IRON, RETICCTPCT in the last 72 hours. Sepsis Labs: Recent Labs  Lab 09/17/19 1725 09/17/19 1905 09/18/19 0040 09/18/19 0057 09/18/19 0359 09/19/19 0528 09/20/19 0559  PROCALCITON  --   --   --  1.21  --  1.07 0.92  LATICACIDVEN 4.5* 4.0* 2.8*  --  1.7  --   --     Recent Results (from the past 240 hour(s))  Blood culture (routine x 2)     Status: None   Collection Time: 09/17/19  5:10 PM   Specimen: BLOOD  Result Value Ref Range Status   Specimen Description BLOOD LEFT ANTECUBITAL  Final   Special Requests   Final    BOTTLES DRAWN AEROBIC ONLY Blood Culture results may not be optimal due to an inadequate volume of blood received in culture bottles   Culture   Final    NO GROWTH 5 DAYS Performed at [facility], [address] [place], [place] [address]    Report Status 09/22/2019 FINAL  Final  Blood culture (routine x 2)     Status: None   Collection Time: 09/17/19  5:25 PM   Specimen: BLOOD  Result Value Ref Range Status   Specimen Description BLOOD RIGHT ANTECUBITAL  Final   Special Requests   Final    BOTTLES DRAWN AEROBIC AND ANAEROBIC Blood Culture results may not be optimal due to an inadequate volume of blood received in culture bottles   Culture   Final    NO GROWTH 5 DAYS Performed at [facility], [place] [address] [place], [place] [address]    Report Status 09/22/2019 FINAL  Final  SARS CORONAVIRUS 2 (TAT 6-24 HRS) Nasopharyngeal Nasopharyngeal Swab     Status: None   Collection Time: 09/17/19  8:43 PM   Specimen: Nasopharyngeal Swab  Result Value Ref Range Status   SARS Coronavirus 2 NEGATIVE NEGATIVE Final    Comment:  (NOTE) SARS-CoV-2 target nucleic acids are NOT DETECTED. The SARS-CoV-2 RNA is generally detectable in upper and lower respiratory specimens during the acute phase of infection. Negative results do not preclude SARS-CoV-2 infection, do not rule out co-infections with other pathogens, and should not be used as the sole basis for treatment or other patient management decisions. Negative results must be combined with clinical observations, patient history, and epidemiological information. The expected result is Negative. Fact Sheet for Patients: SugarRoll.be Fact Sheet for Healthcare Providers: https://www.woods-mathews.com/ This test is not yet approved or cleared by the [place] FDA and  has been authorized for detection and/or diagnosis of SARS-CoV-2 by FDA under an Emergency Use Authorization (EUA). This EUA will remain  in effect (meaning this test can be used) for the duration of the COVID-19 declaration under Section 56 4(b)(1) of the Act, 21 U.S.C. section 360bbb-3(b)(1), unless the authorization is terminated or revoked sooner. Performed at [facility], [place] [address] [place], [place] [address]   Culture, Urine     Status: Abnormal   Collection Time: 09/18/19 11:55 AM   Specimen: Urine, Clean Catch  Result Value Ref Range Status   Specimen Description URINE, CLEAN CATCH  Final   Special Requests   Final    NONE Performed at [facility], [address] [place], [place] [address]    Culture (A)  Final    >=100,000 COLONIES/mL ESCHERICHIA COLI Confirmed Extended Spectrum Beta-Lactamase Producer (ESBL).  In bloodstream infections from ESBL organisms, carbapenems are preferred over piperacillin/tazobactam. They are shown to have a lower risk of mortality.    Report Status 09/20/2019 FINAL  Final   Organism ID, Bacteria ESCHERICHIA COLI (A)  Final      Susceptibility   Escherichia coli - MIC*    AMPICILLIN >=32  RESISTANT Resistant     CEFAZOLIN >=64 RESISTANT Resistant     CEFTRIAXONE >=64 RESISTANT Resistant     CIPROFLOXACIN >=4 RESISTANT Resistant     GENTAMICIN <=1 SENSITIVE Sensitive     IMIPENEM <=0.25 SENSITIVE Sensitive     NITROFURANTOIN <=16 SENSITIVE Sensitive     TRIMETH/SULFA >=320 RESISTANT Resistant     AMPICILLIN/SULBACTAM 8 SENSITIVE Sensitive     PIP/TAZO <=4 SENSITIVE Sensitive     * >=100,000 COLONIES/mL ESCHERICHIA COLI         Radiology Studies: No results found.      Scheduled Meds: . apixaban  5 mg Oral BID  . bacitracin-polymyxin b   Right Eye TID  . Chlorhexidine Gluconate Cloth  6 each Topical Daily  . clopidogrel  75 mg Oral Daily  . feeding supplement (ENSURE ENLIVE)  237 mL Oral TID BM  . feeding supplement (PRO-STAT SUGAR FREE 64)  30 mL Per Tube BID  . gabapentin  200 mg Oral TID  . insulin aspart  0-15 Units Subcutaneous TID WC  . insulin detemir  6 Units Subcutaneous BID  . midodrine  10 mg Oral TID WC  . nutrition supplement (JUVEN)  1 packet Per Tube BID BM  . oxybutynin  5 mg Oral QHS  . pantoprazole  40 mg Oral Daily  . rosuvastatin  20 mg Oral q1800  . sertraline  50 mg Oral Daily  . sodium chloride flush  10-40 mL Intracatheter Q12H  . sucralfate  1 g Oral BID   Continuous Infusions: . sodium chloride 250 mL (09/22/19 1104)     LOS: 6 days     Phillips Climes, MD [facility] P3/07/2019, 4:23 PM

## 2019-09-23 NOTE — Progress Notes (Signed)
Palliative: Ms. Gauger is lying quietly in bed.  Her eyes are closed, but she wakes easily.  She is alert and oriented, able to make her needs known.  There is no family at bedside at this time.  Ms. Cabeza has had her temporary feeding tube removed, and is eating better with a regular diet.  She endorses that her food must have flavor for her to eat well.  We talked about disposition.  At this point Ms. Bergemann declines to go to rehab.  She states that her preference is to return home.  I asked who will care for her and she tells me that her 2 daughters will care for her and she is hopeful that she can get assistance from the insurance company.  I share that the insurance company will come, do their task, and then leave.  I share my concern that prior to her previous hospitalization, her daughters were attempting to help her at home, but she told me that she "fell right through their hands".    I attempted to discuss CODE STATUS.  Ms. Blow tells me that she does not want to talk about this at this time.  She tells me, she wants to "keep that under the bridge".  I encouraged her to discuss with her daughters what she does and does not want.  Conference with attending, bedside nursing staff, transition of care team related to patient condition, needs, disposition.  Addendum: Secure chat from bedside nursing staff stating patient daughters at bedside would like to discuss goals of care.  I returned to Mrs. Mausolf is room, daughter has left for the day.  Call to daughter, Iya Hamed at [phone].  Alanna tells me that she talk with her mother this morning, and she was upset by the palliative visit.  Alanna states that the doctors have not told anyone that she is "terminal".   Alanna goes further to state that they have funeral arrangements, and have considered end-of-life. We talked about palliative medicine as specialized medical care for people with chronic life limiting illnesses, working with symptom  management and goalsetting.  I share that I saw Mrs. Sparano on Friday, getting to know who she is as a person.  I share that goalsetting and CODE STATUS discussions are the ultimate in respect for person. Alanna states understanding, and shares that family will reach out to palliative when they are ready to continue discussions. PMT to shadow for decline.  Conference with attending, bedside nursing staff, transition of care team related to patient condition, needs, goals of care.  Plan:   At this point, Ms. Brossman declines to go to rehab, but it is unclear if her family will be able to care for her at home.  She now has new insurance, and is willing to seek out another rehab with new insurance provider.  She also remains full code/full scope, rehospitalize as needed.  55 minutes, extended time Quinn Axe, NP Palliative Medicine Team Team Phone # [phone] Greater than 50% of this time was spent counseling and coordinating care related to the above assessment and plan.

## 2019-09-23 NOTE — Progress Notes (Signed)
Inpatient Diabetes Program Recommendations  AACE/ADA: New Consensus Statement on Inpatient Glycemic Control (2015)  Target Ranges:  Prepandial:   less than 140 mg/dL      Peak postprandial:   less than 180 mg/dL (1-2 hours)      Critically ill patients:  140 - 180 mg/dL   Lab Results  Component Value Date   GLUCAP 88 09/23/2019   HGBA1C 8.7 (H) 07/27/2019    Review of Glycemic Control Results for PARMINDER, TRAPANI (MRN [id number]) as of 09/23/2019 11:12  Ref. Range 09/23/2019 01:16 09/23/2019 04:39 09/23/2019 05:12 09/23/2019 09:02 09/23/2019 10:38  Glucose-Capillary Latest Ref Range: 70 - 99 mg/dL 81 61 (L) 83 59 (L) 88   Diabetes history: DM 2 Outpatient Diabetes medications:  Metformin 500 mg bid Current orders for Inpatient glycemic control:  Levemir 6 units bid Novolog moderate tid with meals  Inpatient Diabetes Program Recommendations:    Consider reducing Levemir to 5 units q HS.  Consider reducing Novolog to sensitive tid with meals.   Thanks  Adah Perl, RN, BC-ADM Inpatient Diabetes Coordinator Pager [phone] (8a-5p)

## 2019-09-23 NOTE — Progress Notes (Signed)
Patient HR increasing to 160s while sleeping. Dr. Waldron [facility] notified. EKG ordered, metoprolol 23m IV ordered. Patient asymptomatic and states she feels nothing when asked if she feels like her heart is racing. HR in 80s-90s after metoprolol given. Repeat EKG ordered and placed in paper chart. [place]

## 2019-09-23 NOTE — TOC Initial Note (Signed)
Transition of Care [facility]) - Initial/Assessment Note    Patient Details  Name: [name] MRN: [id number] Date of Birth: 1951/06/26  Transition of Care [facility]) CM/SW Contact:    Pollie Friar, RN Phone Number: 09/23/2019, 3:58 PM  Clinical Narrative:                 CM met with the patient and her daughter. Pt has recently been d/ced to 2 different SNF for rehab and doesn't want to return to either facility. Pts insurance has recently changed and they want to see which faciilties offer with the new insurance. CM made a copy of card and faxed it to financial counseling. Once insurance changed will fax out in [place]. If they dont get an offer they find acceptable the plan is for her to d/c home and she will required DME and HH.  TOC following.   Expected Discharge Plan: Skilled Nursing Facility Barriers to Discharge: Continued Medical Work up   Patient Goals and CMS Choice   CMS Medicare.gov Compare Post Acute Care list provided to:: Patient Choice offered to / list presented to : Adult Children, Patient  Expected Discharge Plan and Services Expected Discharge Plan: [place] In-house Referral: Clinical Social Work Discharge Planning Services: CM Consult Post Acute Care Choice: [place] Living arrangements for the past 2 months: [place]                                      Prior Living Arrangements/Services Living arrangements for the past 2 months: [place] Lives with:: Facility Resident Patient language and need for interpreter reviewed:: Yes Do you feel safe going back to the place where you live?: No      Need for Family Participation in Patient Care: Yes (Comment) Care giver support system in place?: No (comment)   Criminal Activity/Legal Involvement Pertinent to Current Situation/Hospitalization: No - Comment as needed  Activities of Daily Living      Permission Sought/Granted                   Emotional Assessment Appearance:: Appears stated age Attitude/Demeanor/Rapport: Engaged Affect (typically observed): Accepting Orientation: : Oriented to Self, Oriented to Place, Oriented to  Time, Oriented to Situation   Psych Involvement: No (comment)  Admission diagnosis:  SIRS (systemic inflammatory response syndrome) (HCC) [R65.10] Elevated lactic acid level [R79.89] Hypotension [I95.9] Skin ulcer of sacrum with fat layer exposed (HCC) [L98.422] Hypotension, unspecified hypotension type [I95.9] Patient Active Problem List   Diagnosis Date Noted  . Goals of care, counseling/discussion   . Palliative care by specialist   . DNR (do not resuscitate) discussion   . Pressure injury of skin 09/18/2019  . Elevated lactic acid level   . Sepsis due to gram-negative UTI ([place]) 09/04/2019  . Urinary tract infection due to extended-spectrum beta lactamase (ESBL) producing Escherichia coli 09/04/2019  . Acute lower UTI 09/02/2019  . History of DVT (deep vein thrombosis) 09/02/2019  . Drug-induced polyneuropathy ([place]) 08/18/2019  . FTT (failure to thrive) in adult 08/17/2019  . SOB (shortness of breath) 08/16/2019  . Lower extremity edema 08/16/2019  . Fall 08/08/2019  . Hypertension 08/08/2019  . COVID-19 virus infection 07/25/2019  . Elevated LFTs 07/25/2019  . COVID-19 07/25/2019  . Allergic contact dermatitis due to adhesives 05/23/2019  . Insomnia 05/23/2019  . SIRS (systemic inflammatory response syndrome) (HCC)  05/14/2019  . NSTEMI (non-ST elevated myocardial infarction) ([place]) 05/14/2019  . Hyperglycemia   . Neutropenia ([place])   . CAD (coronary artery disease)   . Obesity with serious comorbidity 04/16/2019  . Gastroenteritis 03/27/2019  . Falls frequently 03/27/2019  . Acute gastroenteritis 03/22/2019  . Hematoma of scalp 03/14/2019  . Vitamin D deficiency 11/09/2018  . Muscle spasm 10/19/2018  . Chest pain 09/07/2018  . Palpitations 09/07/2018  . Other  fatigue 09/07/2018  . Acute ST elevation myocardial infarction (STEMI) of inferior wall ([place]) 08/27/2018    Class: Hospitalized for  . Atherosclerotic heart disease of native coronary artery with other forms of angina pectoris ([place]) 08/27/2018    Class: Diagnosis of  . Acute vaginitis 08/20/2018  . Hyperlipidemia associated with type 2 diabetes mellitus ([place]) 04/09/2018  . Bronchitis 11/02/2017  . Sepsis ([place]) 11/02/2017  . Leucocytosis 11/02/2017  . Moderate persistent asthma with acute exacerbation 05/26/2017  . Influenza A 05/26/2017  . Acute pain of right shoulder 03/07/2017  . Acute bilateral low back pain without sciatica 03/07/2017  . Chronic pain 03/07/2017  . Fatty liver 03/24/2016  . Weakness 03/23/2016  . Unexplained weight loss 03/23/2016  . Melena 03/23/2016  . Hypotension 02/03/2016  . Syncope 02/03/2016  . Hypomagnesemia 02/03/2016  . Acute renal failure superimposed on stage 3 chronic kidney disease ([place]) 02/03/2016  . Gastrointestinal dysmotility 01/20/2016  . CKD (chronic kidney disease) stage 3, GFR 30-59 ml/min (HCC) 01/20/2016  . Abscess of right thigh 01/20/2016  . Chronic pain syndrome 01/15/2016  . Diarrhea 01/15/2016  . Generalized abdominal pain 01/14/2016  . Type 2 diabetes with nephropathy ([place]) 12/30/2015  . TIA (transient ischemic attack) 07/2015  . Morbid obesity due to excess calories ([place]) 05/05/2015  . Diabetic polyneuropathy associated with type 2 diabetes mellitus ([place]) 01/02/2015  . Acute bacterial sinusitis 01/02/2015  . Onychomycosis 11/04/2014  . Pain in lower limb 11/04/2014  . Multinodular goiter (nontoxic) 10/16/2014  . Iron deficiency anemia 05/20/2014  . Major depressive disorder, single episode, moderate ([place]) 05/20/2014  . Dizziness of unknown cause 04/28/2014  . MDD (major depressive disorder), recurrent episode, moderate ([place]) 01/28/2014  . GERD (gastroesophageal reflux disease) 01/02/2014  . Nausea 10/15/2012  . Stomach  cancer ([place]) 12/10/2008  . CHRONIC RESPIRATORY FAILURE 01/03/2008  . Hyperlipidemia 06/27/2007  . OBESITY, MORBID 06/27/2007  . SLEEP APNEA, OBSTRUCTIVE 06/27/2007  . Generalized idiopathic epilepsy and epileptic syndromes, not intractable, with status epilepticus ([place]) 06/27/2007  . Asthma 06/27/2007  . OSTEOARTHRITIS  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 06/27/2007   PCP:  Ann Held, DO Pharmacy:   [id number] [place] [address]Phone: [phone]: [phone] [address] [address]: [phone]: [phone]    Social Determinants of Health (SDOH) Interventions    Readmission Risk Interventions Readmission Risk Prevention Plan 09/05/2019 09/05/2019 05/16/2019  Transportation Screening Complete - Complete  Medication Review (RN Care Manager) Complete - Complete  PCP or Specialist appointment within 3-5 days of discharge Complete - -  HRI or Home Care Consult Patient refused - Complete  SW Recovery Care/Counseling Consult Complete - Complete  Palliative Care Screening Not Applicable Not Applicable Not Applicable  Skilled Nursing Facility Complete Complete Not Applicable  Some recent data might be hidden

## 2019-09-23 NOTE — Care Management (Signed)
[name] 1951-01-20 09/25/2019  To Whom It May Concern:  Please be advised that the above named patient will require a short-term nursing home stay--anticipated 30 days or less for rehabilitation and strengthening. The plan is for return home.

## 2019-09-24 ENCOUNTER — Inpatient Hospital Stay (HOSPITAL_COMMUNITY): Payer: Medicare HMO

## 2019-09-24 DIAGNOSIS — N1832 Chronic kidney disease, stage 3b: Secondary | ICD-10-CM

## 2019-09-24 LAB — CBC
HCT: 24.8 % — ABNORMAL LOW (ref 36.0–46.0)
Hemoglobin: 8 g/dL — ABNORMAL LOW (ref 12.0–15.0)
MCH: 28.8 pg (ref 26.0–34.0)
MCHC: 32.3 g/dL (ref 30.0–36.0)
MCV: 89.2 fL (ref 80.0–100.0)
Platelets: 318 10*3/uL (ref 150–400)
RBC: 2.78 MIL/uL — ABNORMAL LOW (ref 3.87–5.11)
RDW: 17.1 % — ABNORMAL HIGH (ref 11.5–15.5)
WBC: 15.5 10*3/uL — ABNORMAL HIGH (ref 4.0–10.5)
nRBC: 0.5 % — ABNORMAL HIGH (ref 0.0–0.2)

## 2019-09-24 LAB — GLUCOSE, CAPILLARY
Glucose-Capillary: 117 mg/dL — ABNORMAL HIGH (ref 70–99)
Glucose-Capillary: 140 mg/dL — ABNORMAL HIGH (ref 70–99)
Glucose-Capillary: 142 mg/dL — ABNORMAL HIGH (ref 70–99)
Glucose-Capillary: 168 mg/dL — ABNORMAL HIGH (ref 70–99)
Glucose-Capillary: 71 mg/dL (ref 70–99)
Glucose-Capillary: 83 mg/dL (ref 70–99)

## 2019-09-24 LAB — BASIC METABOLIC PANEL
Anion gap: 7 (ref 5–15)
BUN: 21 mg/dL (ref 8–23)
CO2: 28 mmol/L (ref 22–32)
Calcium: 7.1 mg/dL — ABNORMAL LOW (ref 8.9–10.3)
Chloride: 111 mmol/L (ref 98–111)
Creatinine, Ser: 1.01 mg/dL — ABNORMAL HIGH (ref 0.44–1.00)
GFR calc Af Amer: >60 mL/min (ref >60)
GFR calc non Af Amer: 58 mL/min — ABNORMAL LOW (ref >60)
Glucose, Bld: 84 mg/dL (ref 70–99)
Potassium: 3.1 mmol/L — ABNORMAL LOW (ref 3.5–5.1)
Sodium: 146 mmol/L — ABNORMAL HIGH (ref 135–145)

## 2019-09-24 LAB — PHOSPHORUS: Phosphorus: 2.7 mg/dL (ref 2.5–4.6)

## 2019-09-24 LAB — MAGNESIUM: Magnesium: 1.6 mg/dL — ABNORMAL LOW (ref 1.7–2.4)

## 2019-09-24 MED ORDER — LORAZEPAM 0.5 MG PO TABS
0.2500 mg | ORAL_TABLET | Freq: Once | ORAL | Status: AC
  Administered 2019-09-24: 0.25 mg via ORAL
  Filled 2019-09-24: qty 1

## 2019-09-24 MED ORDER — POTASSIUM CHLORIDE CRYS ER 20 MEQ PO TBCR
40.0000 meq | EXTENDED_RELEASE_TABLET | Freq: Two times a day (BID) | ORAL | Status: AC
  Administered 2019-09-24 (×2): 40 meq via ORAL
  Filled 2019-09-24 (×2): qty 2

## 2019-09-24 MED ORDER — MAGNESIUM SULFATE 2 GM/50ML IV SOLN
2.0000 g | Freq: Once | INTRAVENOUS | Status: AC
  Administered 2019-09-24: 2 g via INTRAVENOUS
  Filled 2019-09-24: qty 50

## 2019-09-24 MED ORDER — METOPROLOL TARTRATE 12.5 MG HALF TABLET
12.5000 mg | ORAL_TABLET | Freq: Two times a day (BID) | ORAL | Status: DC
  Administered 2019-09-24 – 2019-09-26 (×6): 12.5 mg via ORAL
  Filled 2019-09-24 (×7): qty 1

## 2019-09-24 NOTE — NC FL2 (Signed)
[place] MEDICAID FL2 LEVEL OF CARE SCREENING TOOL     IDENTIFICATION  Patient Name: [name] Birthdate: 16-Jan-1951 Sex: female Admission Date (Current Location): 09/17/2019  [facility] and [place] Number:  Herbalist and Address:  [facility], [place] [address] [place], [place] [address]      Provider Number: [id number]  Attending Physician Name and Address:  Hosie Poisson, MD  Relative Name and Phone Number:       Current Level of Care: Hospital Recommended Level of Care: [place] Prior Approval Number:    Date Approved/Denied:   PASRR Number: pending  Discharge Plan: SNF    Current Diagnoses: Patient Active Problem List   Diagnosis Date Noted  . Goals of care, counseling/discussion   . Palliative care by specialist   . DNR (do not resuscitate) discussion   . Pressure injury of skin 09/18/2019  . Elevated lactic acid level   . Sepsis due to gram-negative UTI ([place]) 09/04/2019  . Urinary tract infection due to extended-spectrum beta lactamase (ESBL) producing Escherichia coli 09/04/2019  . Acute lower UTI 09/02/2019  . History of DVT (deep vein thrombosis) 09/02/2019  . Drug-induced polyneuropathy ([place]) 08/18/2019  . FTT (failure to thrive) in adult 08/17/2019  . SOB (shortness of breath) 08/16/2019  . Lower extremity edema 08/16/2019  . Fall 08/08/2019  . Hypertension 08/08/2019  . COVID-19 virus infection 07/25/2019  . Elevated LFTs 07/25/2019  . COVID-19 07/25/2019  . Allergic contact dermatitis due to adhesives 05/23/2019  . Insomnia 05/23/2019  . SIRS (systemic inflammatory response syndrome) ([place]) 05/14/2019  . NSTEMI (non-ST elevated myocardial infarction) ([place]) 05/14/2019  . Hyperglycemia   . Neutropenia ([place])   . CAD (coronary artery disease)   . Obesity with serious comorbidity 04/16/2019  . Gastroenteritis 03/27/2019  . Falls frequently 03/27/2019  . Acute gastroenteritis 03/22/2019  . Hematoma  of scalp 03/14/2019  . Vitamin D deficiency 11/09/2018  . Muscle spasm 10/19/2018  . Chest pain 09/07/2018  . Palpitations 09/07/2018  . Other fatigue 09/07/2018  . Acute ST elevation myocardial infarction (STEMI) of inferior wall ([place]) 08/27/2018  . Atherosclerotic heart disease of native coronary artery with other forms of angina pectoris ([place]) 08/27/2018  . Acute vaginitis 08/20/2018  . Hyperlipidemia associated with type 2 diabetes mellitus ([place]) 04/09/2018  . Bronchitis 11/02/2017  . Sepsis ([place]) 11/02/2017  . Leucocytosis 11/02/2017  . Moderate persistent asthma with acute exacerbation 05/26/2017  . Influenza A 05/26/2017  . Acute pain of right shoulder 03/07/2017  . Acute bilateral low back pain without sciatica 03/07/2017  . Chronic pain 03/07/2017  . Fatty liver 03/24/2016  . Weakness 03/23/2016  . Unexplained weight loss 03/23/2016  . Melena 03/23/2016  . Hypotension 02/03/2016  . Syncope 02/03/2016  . Hypomagnesemia 02/03/2016  . Acute renal failure superimposed on stage 3 chronic kidney disease ([place]) 02/03/2016  . Gastrointestinal dysmotility 01/20/2016  . CKD (chronic kidney disease) stage 3, GFR 30-59 ml/min (HCC) 01/20/2016  . Abscess of right thigh 01/20/2016  . Chronic pain syndrome 01/15/2016  . Diarrhea 01/15/2016  . Generalized abdominal pain 01/14/2016  . Type 2 diabetes with nephropathy ([place]) 12/30/2015  . TIA (transient ischemic attack) 07/2015  . Morbid obesity due to excess calories ([place]) 05/05/2015  . Diabetic polyneuropathy associated with type 2 diabetes mellitus ([place]) 01/02/2015  . Acute bacterial sinusitis 01/02/2015  . Onychomycosis 11/04/2014  . Pain in lower limb 11/04/2014  . Multinodular goiter (nontoxic) 10/16/2014  . Iron  deficiency anemia 05/20/2014  . Major depressive disorder, single episode, moderate ([place]) 05/20/2014  . Dizziness of unknown cause 04/28/2014  . MDD (major depressive disorder), recurrent episode, moderate ([place])  01/28/2014  . GERD (gastroesophageal reflux disease) 01/02/2014  . Nausea 10/15/2012  . Stomach cancer ([place]) 12/10/2008  . CHRONIC RESPIRATORY FAILURE 01/03/2008  . Hyperlipidemia 06/27/2007  . OBESITY, MORBID 06/27/2007  . SLEEP APNEA, OBSTRUCTIVE 06/27/2007  . Generalized idiopathic epilepsy and epileptic syndromes, not intractable, with status epilepticus ([place]) 06/27/2007  . Asthma 06/27/2007  . OSTEOARTHRITIS  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 06/27/2007    Orientation RESPIRATION BLADDER Height & Weight     Self, Time, Situation, Place  Normal Incontinent Weight: 102.7 kg Height:  5' 6"  (167.6 cm)  BEHAVIORAL SYMPTOMS/MOOD NEUROLOGICAL BOWEL NUTRITION STATUS      Continent Diet(see discharge summary)  AMBULATORY STATUS COMMUNICATION OF NEEDS Skin   Extensive Assist Verbally Bruising(ecchymosis to all extremities/ MASD to bilat groins/ rash to abd and chest)   PU Stage 2 Dressing: (foam dressing on sacrum)                   Personal Care Assistance Level of Assistance  Bathing, Feeding, Dressing Bathing Assistance: Maximum assistance Feeding assistance: Independent Dressing Assistance: Maximum assistance     Functional Limitations Info  Sight, Hearing, Speech Sight Info: Adequate Hearing Info: Adequate Speech Info: Adequate    SPECIAL CARE FACTORS FREQUENCY  OT (By licensed OT), PT (By licensed PT)     PT Frequency: 5x/wk OT Frequency: 5x/wk            Contractures Contractures Info: Not present    Additional Factors Info  Code Status, Allergies Code Status Info: Full Allergies Info: Cephalexin, Adhesive (Tape), Codeine, Zocor (Simvastatin - High Dose) Psychotropic Info: Zoloft 50 mg daily/ Neurontin 200 mg TID Insulin Sliding Scale Info: insulin aspart (novoLOG) injection 0-15 Units 3x daily with meals; insulin detemir (LEVEMIR) injection 10 Units 2x daily Isolation Precautions Info: Contact for ESBL     Current Medications (09/24/2019):  This is the  current hospital active medication list Current Facility-Administered Medications  Medication Dose Route Frequency Provider Last Rate Last Admin  . 0.9 %  sodium chloride infusion  250 mL Intravenous Continuous Adhikari, Amrit, MD 20 mL/hr at 09/22/19 1104 250 mL at 09/22/19 1104  . acetaminophen (TYLENOL) tablet 650 mg  650 mg Oral Q6H PRN Rise Patience, MD   650 mg at 09/22/19 2025   Or  . acetaminophen (TYLENOL) suppository 650 mg  650 mg Rectal Q6H PRN Rise Patience, MD      . albuterol (PROVENTIL) (2.5 MG/3ML) 0.083% nebulizer solution 2.5 mg  2.5 mg Nebulization Q6H PRN Rise Patience, MD      . apixaban [name]) tablet 5 mg  5 mg Oral BID Rise Patience, MD   5 mg at 09/24/19 0825  . bacitracin-polymyxin b (POLYSPORIN) ophthalmic ointment   Right Eye TID Shelly Coss, MD   Given at 09/24/19 [phone]  . Chlorhexidine Gluconate Cloth 2 % PADS 6 each  6 each Topical Daily Rise Patience, MD   6 each at 09/23/19 1041  . clopidogrel (PLAVIX) tablet 75 mg  75 mg Oral Daily Rise Patience, MD   75 mg at 09/24/19 6237  . diltiazem (CARDIZEM) tablet 30 mg  30 mg Oral Q8H Elgergawy, Silver Huguenin, MD   30 mg at 09/24/19 0556  . feeding supplement (ENSURE ENLIVE) (ENSURE ENLIVE) liquid  237 mL  237 mL Oral TID BM Ollis, Brandi L, NP   237 mL at 09/23/19 2124  . feeding supplement (PRO-STAT SUGAR FREE 64) liquid 30 mL  30 mL Per Tube BID Collene Gobble, MD   30 mL at 09/23/19 2121  . gabapentin (NEURONTIN) capsule 200 mg  200 mg Oral TID Jennelle Human B, NP   200 mg at 09/24/19 0826  . insulin aspart (novoLOG) injection 0-15 Units  0-15 Units Subcutaneous TID WC Elgergawy, Silver Huguenin, MD   2 Units at 09/24/19 1216  . insulin detemir (LEVEMIR) injection 6 Units  6 Units Subcutaneous BID Elgergawy, Silver Huguenin, MD      . magic mouthwash w/lidocaine  10 mL Oral TID PRN Jennelle Human B, NP   10 mL at 09/20/19 1040  . metoCLOPramide (REGLAN) tablet 5 mg  5 mg Oral Q8H PRN  Jennelle Human B, NP      . metoprolol tartrate (LOPRESSOR) tablet 12.5 mg  12.5 mg Oral BID Hosie Poisson, MD   12.5 mg at 09/24/19 1215  . midodrine (PROAMATINE) tablet 10 mg  10 mg Oral TID WC Audria Nine, DO   10 mg at 09/24/19 1214  . nutrition supplement (JUVEN) (JUVEN) powder packet 1 packet  1 packet Per Tube BID BM Collene Gobble, MD   1 packet at 09/23/19 1041  . ondansetron (ZOFRAN) tablet 4 mg  4 mg Oral Q6H PRN Jennelle Human B, NP       Or  . ondansetron (ZOFRAN) injection 4 mg  4 mg Intravenous Q6H PRN Jennelle Human B, NP   4 mg at 09/20/19 0325  . oxybutynin (DITROPAN-XL) 24 hr tablet 5 mg  5 mg Oral QHS Collene Gobble, MD   5 mg at 09/23/19 2121  . pantoprazole (PROTONIX) EC tablet 40 mg  40 mg Oral Daily Rise Patience, MD   40 mg at 09/24/19 3662  . potassium chloride SA (KLOR-CON) CR tablet 40 mEq  40 mEq Oral BID Hosie Poisson, MD   40 mEq at 09/24/19 1043  . rosuvastatin (CRESTOR) tablet 20 mg  20 mg Oral q1800 Rise Patience, MD   20 mg at 09/23/19 1831  . sertraline (ZOLOFT) tablet 50 mg  50 mg Oral Daily Rise Patience, MD   50 mg at 09/24/19 0825  . sodium chloride flush (NS) 0.9 % injection 10-40 mL  10-40 mL Intracatheter Q12H Collene Gobble, MD   10 mL at 09/24/19 0857  . sodium chloride flush (NS) 0.9 % injection 10-40 mL  10-40 mL Intracatheter PRN Collene Gobble, MD      . sucralfate (CARAFATE) tablet 1 g  1 g Oral BID Rise Patience, MD   1 g at 09/24/19 0825  . traMADol (ULTRAM) tablet 50 mg  50 mg Oral Q6H PRN Shelly Coss, MD   50 mg at 09/24/19 9476   Facility-Administered Medications Ordered in Other Encounters  Medication Dose Route Frequency Provider Last Rate Last Admin  . sodium chloride flush (NS) 0.9 % injection 10 mL  10 mL Intravenous PRN [place], Holli Humbles, NP   10 mL at 09/13/16 1146     Discharge Medications: Please see discharge summary for a list of discharge medications.  Relevant Imaging  Results:  Relevant Lab Results:   Additional Information LY#[id number]----CL HAS [employer] MEDICARE FOR SNF PLACEMENT THAT WENT INTO EFFECT 09/23/2019: [id number]  Pollie Friar, RN

## 2019-09-24 NOTE — Progress Notes (Signed)
HR 150s, in to room to see patient she is talking on the phone.  Patient states that she feels depressed and is worried about her vision, patient states that she just started having blurred vision.  Dr. Karleen Hampshire notified.  Orders placed.

## 2019-09-24 NOTE — Progress Notes (Signed)
PROGRESS NOTE    [name]  HYW:[id number] DOB: 05-25-1951 DOA: 09/17/2019 PCP: Ann Held, DO    Brief Narrative:  69 year old lady prior history of type 2 diabetes, coronary artery disease s/p PCI, recently diagnosed DVT, recent diagnosis of Covid 2 months ago discharged to rehab presents to ED for generalized weakness.  On arrival to ED she was hypotensive, elevated lactic acid and in AKI.  She was admitted for sepsis and initially required admission to ICU for pressor support.  She was weaned off pressors and transferred to [facility] on 09/21/2019.  Patient seen and examined today reports she is anxious and also depressed about going to rehab facility.  Assessment & Plan:   Principal Problem:   Hypotension Active Problems:   Stomach cancer (HCC)   SLEEP APNEA, OBSTRUCTIVE   Iron deficiency anemia   Type 2 diabetes with nephropathy (HCC)   CKD (chronic kidney disease) stage 3, GFR 30-59 ml/min (HCC)   History of DVT (deep vein thrombosis)   Pressure injury of skin   Goals of care, counseling/discussion   Palliative care by specialist   DNR (do not resuscitate) discussion   Septic shock Multifactorial, hypovolemic and a component of sepsis secondary to multidrug resistant E. coli UTI along with failure to thrive and malnutrition.  She initially required pressors weaned off and currently on midodrine. Blood cultures have been negative so far. Completed the course of antibiotics for presumed aspiration pneumonia and UTI. Earlier today patient had low-grade temp of 100.2, tachycardic, soft blood pressures, improving leukocytosis  Hypokalemia, hypomagnesemia, hypophosphatemia Replaced and repeat in the morning.   Failure to thrive, severe protein calorie malnutrition Dietary consulted and watch for refeeding syndrome.    Type 2 diabetes mellitus CBG (last 3)  Recent Labs    09/24/19 0428 09/24/19 1201 09/24/19 1550  GLUCAP 83 142* 140*   Resume sliding scale  insulin and liberalize her diet.    History of coronary artery disease s/p PCI Continue with Plavix and beta-blocker.  Resumed metoprolol 12.5 mg twice daily    Anemia of chronic disease/iron deficiency anemia Transfuse to keep hemoglobin greater than 7. Hemoglobin is at 8     Stage III CKD with hyperkalemia Improving, creatinine appears to be at baseline and potassium is 3.1 today     History of DVT On Eliquis.  Pressure injury present on admission.  Pressure Injury 09/17/19 Sacrum Medial Stage 2 -  Partial thickness loss of dermis presenting as a shallow open injury with a red, pink wound bed without slough. (Active)  09/17/19 2307  Location: Sacrum  Location Orientation: Medial  Staging: Stage 2 -  Partial thickness loss of dermis presenting as a shallow open injury with a red, pink wound bed without slough.  Wound Description (Comments):   Present on Admission: Yes  Wound care consulted.  History of neuropathy pain in the lower extremities Continue with tramadol.   Blurry vision earlier this morning on the right Nonfocal neuro exam CT of the head without contrast ordered for further evaluation   Depression/anxiety Psychiatric consulted for medication assistance.   DVT prophylaxis: Eliquis.  Code Status: full code.  Family Communication: Discussed with daughter over the phone Disposition Plan:  . Patient came from:SNF, therapy evalutions recommending SNF. Pt not stable for discharge yet, she is at risk for  Readmission, bp parameters are soft, evaluation of blurry vision in progress.   Consultants:  None Procedures: None Antimicrobials:  Subjective: Reports being anxious. Does not want to  go to rehab facility but also reports has no choice.  No chest pain or sob. Does not feel palpitations.  Has reported some blurry vision earlier to the RN.   Objective: Vitals:   09/24/19 0329 09/24/19 0749 09/24/19 1204 09/24/19 1552  BP: 105/67 109/70 105/60  91/60  Pulse: 91  91 90  Resp: 18 16 16 18   Temp: 98.6 F (37 C) 98.5 F (36.9 C)  98.8 F (37.1 C)  TempSrc: Oral Axillary  Oral  SpO2:  94% 97% 97%  Weight:      Height:        Intake/Output Summary (Last 24 hours) at 09/24/2019 1608 Last data filed at 09/24/2019 1605 Gross per 24 hour  Intake 452.35 ml  Output --  Net 452.35 ml   Filed Weights   09/20/19 0500 09/21/19 0500 09/22/19 0500  Weight: 101.8 kg 104.8 kg 102.7 kg    Examination:  General exam: Appears calm and comfortable  Respiratory system: Clear to auscultation. Respiratory effort normal. Cardiovascular system: S1 & S2 heard, RRR. No JVD, murmurs, rubs, gallops or clicks. No pedal edema. Gastrointestinal system: Abdomen is nondistended, soft and nontender. No organomegaly or masses felt. Normal bowel sounds heard. Central nervous system: Alert and oriented.  Grossly nonfocal Extremities: Bilateral leg edema present Skin: Stage II sacral pressure ulcer Psychiatry: Anxious    Data Reviewed: I have personally reviewed following labs and imaging studies  CBC: Recent Labs  Lab 09/19/19 0528 09/19/19 1205 09/20/19 0559 09/21/19 0520 09/22/19 0425 09/23/19 0453 09/24/19 0438  WBC 11.3*  --  17.5* 19.3* 18.6* 19.3* 15.5*  NEUTROABS 9.2*  --   --   --   --   --   --   HGB 8.8*   < > 8.8* 8.3* 8.5* 9.0* 8.0*  HCT 26.0*   < > 26.2* 24.8* 25.6* 26.2* 24.8*  MCV 86.4  --  87.3 87.0 88.6 86.2 89.2  PLT 248  --  235 230 258 309 318   < > = values in this interval not displayed.   Basic Metabolic Panel: Recent Labs  Lab 09/20/19 0559 09/20/19 0559 09/20/19 1700 09/21/19 0520 09/22/19 0425 09/23/19 0453 09/24/19 0438  NA 141  --   --  140 141 143 146*  K 4.5  --   --  3.9 3.6 3.2* 3.1*  CL 110  --   --  109 108 109 111  CO2 23  --   --  24 25 26 28   GLUCOSE 233*  --   --  128* 92 67* 84  BUN 13  --   --  13 16 19 21   CREATININE 2.08*  --   --  1.53* 1.42* 1.07* 1.01*  CALCIUM 7.2*  --   --  7.0*  7.0* 7.4* 7.1*  MG 1.6*  --   --  1.8 1.8 2.1 1.6*  PHOS 2.4*   < > 2.2* 2.1* 3.8 2.8 2.7   < > = values in this interval not displayed.   GFR: Estimated Creatinine Clearance: 65.4 mL/min (A) (by C-G formula based on SCr of 1.01 mg/dL (H)). Liver Function Tests: Recent Labs  Lab 09/17/19 1725 09/20/19 0559 09/21/19 0520  AST 64*  --   --   ALT 26  --   --   ALKPHOS 190*  --   --   BILITOT 1.2  --   --   PROT 5.0*  --   --   ALBUMIN 1.4* 1.2* 1.2*  Recent Labs  Lab 09/17/19 1725  LIPASE 10*   No results for input(s): AMMONIA in the last 168 hours. Coagulation Profile: No results for input(s): INR, PROTIME in the last 168 hours. Cardiac Enzymes: No results for input(s): CKTOTAL, CKMB, CKMBINDEX, TROPONINI in the last 168 hours. BNP (last 3 results) No results for input(s): PROBNP in the last 8760 hours. HbA1C: No results for input(s): HGBA1C in the last 72 hours. CBG: Recent Labs  Lab 09/23/19 2047 09/24/19 0045 09/24/19 0428 09/24/19 1201 09/24/19 1550  GLUCAP 83 71 83 142* 140*   Lipid Profile: No results for input(s): CHOL, HDL, LDLCALC, TRIG, CHOLHDL, LDLDIRECT in the last 72 hours. Thyroid Function Tests: No results for input(s): TSH, T4TOTAL, FREET4, T3FREE, THYROIDAB in the last 72 hours. Anemia Panel: No results for input(s): VITAMINB12, FOLATE, FERRITIN, TIBC, IRON, RETICCTPCT in the last 72 hours. Sepsis Labs: Recent Labs  Lab 09/17/19 1725 09/17/19 1905 09/18/19 0040 09/18/19 0057 09/18/19 0359 09/19/19 0528 09/20/19 0559  PROCALCITON  --   --   --  1.21  --  1.07 0.92  LATICACIDVEN 4.5* 4.0* 2.8*  --  1.7  --   --     Recent Results (from the past 240 hour(s))  Blood culture (routine x 2)     Status: None   Collection Time: 09/17/19  5:10 PM   Specimen: BLOOD  Result Value Ref Range Status   Specimen Description BLOOD LEFT ANTECUBITAL  Final   Special Requests   Final    BOTTLES DRAWN AEROBIC ONLY Blood Culture results may not be  optimal due to an inadequate volume of blood received in culture bottles   Culture   Final    NO GROWTH 5 DAYS Performed at [facility], [place] [address], [place], [place] [address]    Report Status 09/22/2019 FINAL  Final  Blood culture (routine x 2)     Status: None   Collection Time: 09/17/19  5:25 PM   Specimen: BLOOD  Result Value Ref Range Status   Specimen Description BLOOD RIGHT ANTECUBITAL  Final   Special Requests   Final    BOTTLES DRAWN AEROBIC AND ANAEROBIC Blood Culture results may not be optimal due to an inadequate volume of blood received in culture bottles   Culture   Final    NO GROWTH 5 DAYS Performed at [facility], [place] [address], [place], [place] [address]    Report Status 09/22/2019 FINAL  Final  SARS CORONAVIRUS 2 (TAT 6-24 HRS) Nasopharyngeal Nasopharyngeal Swab     Status: None   Collection Time: 09/17/19  8:43 PM   Specimen: Nasopharyngeal Swab  Result Value Ref Range Status   SARS Coronavirus 2 NEGATIVE NEGATIVE Final    Comment: (NOTE) SARS-CoV-2 target nucleic acids are NOT DETECTED. The SARS-CoV-2 RNA is generally detectable in upper and lower respiratory specimens during the acute phase of infection. Negative results do not preclude SARS-CoV-2 infection, do not rule out co-infections with other pathogens, and should not be used as the sole basis for treatment or other patient management decisions. Negative results must be combined with clinical observations, patient history, and epidemiological information. The expected result is Negative. Fact Sheet for Patients: SugarRoll.be Fact Sheet for Healthcare Providers: https://www.woods-mathews.com/ This test is not yet approved or cleared by the [place] FDA and  has been authorized for detection and/or diagnosis of SARS-CoV-2 by FDA under an Emergency Use Authorization (EUA). This EUA will remain  in effect (meaning this test  can be used) for  the duration of the COVID-19 declaration under Section 56 4(b)(1) of the Act, 21 U.S.C. section 360bbb-3(b)(1), unless the authorization is terminated or revoked sooner. Performed at [facility], [place] [address], [place] [place] [employer], [place] [address]   Culture, Urine     Status: Abnormal   Collection Time: 09/18/19 11:55 AM   Specimen: Urine, Clean Catch  Result Value Ref Range Status   Specimen Description URINE, CLEAN CATCH  Final   Special Requests   Final    NONE Performed at [facility], [place] [address], [place], [place] [address]    Culture (A)  Final    >=100,000 COLONIES/mL ESCHERICHIA COLI Confirmed Extended Spectrum Beta-Lactamase Producer (ESBL).  In bloodstream infections from ESBL organisms, carbapenems are preferred over piperacillin/tazobactam. They are shown to have a lower risk of mortality.    Report Status 09/20/2019 FINAL  Final   Organism ID, Bacteria ESCHERICHIA COLI (A)  Final      Susceptibility   Escherichia coli - MIC*    AMPICILLIN >=32 RESISTANT Resistant     CEFAZOLIN >=64 RESISTANT Resistant     CEFTRIAXONE >=64 RESISTANT Resistant     CIPROFLOXACIN >=4 RESISTANT Resistant     GENTAMICIN <=1 SENSITIVE Sensitive     IMIPENEM <=0.25 SENSITIVE Sensitive     NITROFURANTOIN <=16 SENSITIVE Sensitive     TRIMETH/SULFA >=320 RESISTANT Resistant     AMPICILLIN/SULBACTAM 8 SENSITIVE Sensitive     PIP/TAZO <=4 SENSITIVE Sensitive     * >=100,000 COLONIES/mL ESCHERICHIA COLI         Radiology Studies: CT HEAD WO CONTRAST  Result Date: 09/24/2019 CLINICAL DATA:  Subacute neuro deficit EXAM: CT HEAD WITHOUT CONTRAST TECHNIQUE: Contiguous axial images were obtained from the base of the skull through the vertex without intravenous contrast. COMPARISON:  CT head 09/17/2019 FINDINGS: Brain: No evidence of acute infarction, hemorrhage, hydrocephalus, extra-axial collection or mass lesion/mass effect. Vascular: Negative for hyperdense vessel Skull: Negative  Sinuses/Orbits: Negative Other: None IMPRESSION: Negative CT head Electronically Signed   By: Franchot Gallo M.D.   On: 09/24/2019 13:33        Scheduled Meds: . apixaban  5 mg Oral BID  . bacitracin-polymyxin b   Right Eye TID  . Chlorhexidine Gluconate Cloth  6 each Topical Daily  . clopidogrel  75 mg Oral Daily  . diltiazem  30 mg Oral Q8H  . feeding supplement (ENSURE ENLIVE)  237 mL Oral TID BM  . feeding supplement (PRO-STAT SUGAR FREE 64)  30 mL Per Tube BID  . gabapentin  200 mg Oral TID  . insulin aspart  0-15 Units Subcutaneous TID WC  . insulin detemir  6 Units Subcutaneous BID  . metoprolol tartrate  12.5 mg Oral BID  . midodrine  10 mg Oral TID WC  . nutrition supplement (JUVEN)  1 packet Per Tube BID BM  . oxybutynin  5 mg Oral QHS  . pantoprazole  40 mg Oral Daily  . potassium chloride  40 mEq Oral BID  . rosuvastatin  20 mg Oral q1800  . sertraline  50 mg Oral Daily  . sodium chloride flush  10-40 mL Intracatheter Q12H  . sucralfate  1 g Oral BID   Continuous Infusions: . sodium chloride Stopped (09/24/19 1255)     LOS: 7 days        Hosie Poisson, MD [facility]   To contact the attending provider between 7A-7P or the covering provider during after  hours 7P-7A, please log into the web site [email or web address] and access using universal [place] password for that web site. If you do not have the password, please call the hospital operator.  09/24/2019, 4:08 PM

## 2019-09-24 NOTE — Progress Notes (Signed)
MEWS 2 due to BP 95/61 and HR 103. Not an acute change. Provider notified.

## 2019-09-24 NOTE — Progress Notes (Signed)
Physical Therapy Treatment Patient Details Name: [name] MRN: [id number] DOB: Nov 23, 1950 Today's Date: 09/24/2019    History of Present Illness 69 y.o. female with past medical history significant for DM2, HLD, Asthma who presented to the ED on 2/23 with shock secondary to hypovolemia, adrenal insufficiency, renal insufficiency, and Ecoli UTI. Pt with recent stay at SNF, d/c home for 1 day and readmitted. Pt with recent [place] and [facility] admission for covid 06/2019.    PT Comments    Pt very depressed like upon entry.  She require max encouragement to progress mobility.  She remains weak in B LEs and buckling noted in stance.  She reports blurred vision.  Continue to recommend SNF placement based on weakness and current level of function.      Follow Up Recommendations  SNF;Supervision/Assistance - 24 hour     Equipment Recommendations       Recommendations for Other Services       Precautions / Restrictions Precautions Precautions: Fall Precaution Comments: Pt reports she was falling every day Restrictions Weight Bearing Restrictions: No    Mobility  Bed Mobility Overal bed mobility: Needs Assistance Bed Mobility: Supine to Sit;Sit to Supine     Supine to sit: Mod assist;+2 for physical assistance     General bed mobility comments: Pt required assistance to advance LEs to edge of bed and elevate trunk into a seated position.  Transfers Overall transfer level: Needs assistance Equipment used: Rolling walker (2 wheeled);Ambulation equipment used(sara stedy used after buckling knees in standing.) Transfers: Sit to/from Stand Sit to Stand: Max assist;+2 physical assistance;From elevated surface         General transfer comment: Increased assistance to power into standing,  Pt required elevated bed height and use of bed pad to boost into standing, presents with head and trunk flexed forward.  Pt performed initial transfer with RW and additional transfers with sara  stedy.  Ambulation/Gait Ambulation/Gait assistance: (NT knees buckling in standing.)               Stairs             Wheelchair Mobility    Modified Rankin (Stroke Patients Only)       Balance Overall balance assessment: Needs assistance;History of Falls   Sitting balance-Leahy Scale: Fair     Standing balance support: During functional activity;Bilateral upper extremity supported Standing balance-Leahy Scale: Poor Standing balance comment: Heavy reliance on UE support and external assistance.                            Cognition   Behavior During Therapy: Flat affect Overall Cognitive Status: Within Functional Limits for tasks assessed                                 General Comments: Pt sad and stating she wants to go home. Moments of quiet crying.      Exercises General Exercises - Lower Extremity Ankle Circles/Pumps: AROM;Both;20 reps;Supine Quad Sets: AROM;Both;10 reps;Supine Heel Slides: AROM;Left;AAROM;Right;Supine;10 reps Hip ABduction/ADduction: AROM;Both;Supine;10 reps;AAROM    General Comments        Pertinent Vitals/Pain Pain Assessment: No/denies pain Faces Pain Scale: Hurts even more Pain Location: R hip Pain Descriptors / Indicators: Discomfort;Sore Pain Intervention(s): Monitored during session;Repositioned    Home Living  Prior Function            PT Goals (current goals can now be found in the care plan section) Acute Rehab PT Goals Patient Stated Goal: "I don't want to go back to one of those nasty rehab centers." Potential to Achieve Goals: Good    Frequency    Min 2X/week      PT Plan Current plan remains appropriate    Co-evaluation              AM-PAC PT "6 Clicks" Mobility   Outcome Measure  Help needed turning from your back to your side while in a flat bed without using bedrails?: A Lot Help needed moving from lying on your back to sitting  on the side of a flat bed without using bedrails?: A Lot Help needed moving to and from a bed to a chair (including a wheelchair)?: Total Help needed standing up from a chair using your arms (e.g., wheelchair or bedside chair)?: Total Help needed to walk in hospital room?: Total Help needed climbing 3-5 steps with a railing? : Total 6 Click Score: 8    End of Session Equipment Utilized During Treatment: Gait belt Activity Tolerance: Patient tolerated treatment well Patient left: with call bell/phone within reach;in chair;with chair alarm set Nurse Communication: Mobility status;Other (comment) PT Visit Diagnosis: Other abnormalities of gait and mobility (R26.89);Muscle weakness (generalized) (M62.81)     Time: 1445-1510 PT Time Calculation (min) (ACUTE ONLY): 25 min  Charges:  $Therapeutic Activity: 23-37 mins                     [name] , PTA Acute Rehabilitation Services Pager [phone] Office [phone]     [name] Eli Hose 09/24/2019, 4:15 PM

## 2019-09-25 ENCOUNTER — Inpatient Hospital Stay (HOSPITAL_COMMUNITY): Payer: Medicare HMO

## 2019-09-25 DIAGNOSIS — D508 Other iron deficiency anemias: Secondary | ICD-10-CM

## 2019-09-25 DIAGNOSIS — K219 Gastro-esophageal reflux disease without esophagitis: Secondary | ICD-10-CM

## 2019-09-25 DIAGNOSIS — G4733 Obstructive sleep apnea (adult) (pediatric): Secondary | ICD-10-CM

## 2019-09-25 LAB — CBC
HCT: 23.4 % — ABNORMAL LOW (ref 36.0–46.0)
Hemoglobin: 7.7 g/dL — ABNORMAL LOW (ref 12.0–15.0)
MCH: 28.9 pg (ref 26.0–34.0)
MCHC: 32.9 g/dL (ref 30.0–36.0)
MCV: 88 fL (ref 80.0–100.0)
Platelets: 345 10*3/uL (ref 150–400)
RBC: 2.66 MIL/uL — ABNORMAL LOW (ref 3.87–5.11)
RDW: 17.2 % — ABNORMAL HIGH (ref 11.5–15.5)
WBC: 11.8 10*3/uL — ABNORMAL HIGH (ref 4.0–10.5)
nRBC: 0.6 % — ABNORMAL HIGH (ref 0.0–0.2)

## 2019-09-25 LAB — GLUCOSE, CAPILLARY
Glucose-Capillary: 104 mg/dL — ABNORMAL HIGH (ref 70–99)
Glucose-Capillary: 108 mg/dL — ABNORMAL HIGH (ref 70–99)
Glucose-Capillary: 123 mg/dL — ABNORMAL HIGH (ref 70–99)
Glucose-Capillary: 130 mg/dL — ABNORMAL HIGH (ref 70–99)
Glucose-Capillary: 145 mg/dL — ABNORMAL HIGH (ref 70–99)
Glucose-Capillary: 152 mg/dL — ABNORMAL HIGH (ref 70–99)
Glucose-Capillary: 59 mg/dL — ABNORMAL LOW (ref 70–99)
Glucose-Capillary: 77 mg/dL (ref 70–99)

## 2019-09-25 LAB — BASIC METABOLIC PANEL
Anion gap: 6 (ref 5–15)
BUN: 17 mg/dL (ref 8–23)
CO2: 29 mmol/L (ref 22–32)
Calcium: 7 mg/dL — ABNORMAL LOW (ref 8.9–10.3)
Chloride: 110 mmol/L (ref 98–111)
Creatinine, Ser: 1.08 mg/dL — ABNORMAL HIGH (ref 0.44–1.00)
GFR calc Af Amer: >60 mL/min (ref >60)
GFR calc non Af Amer: 53 mL/min — ABNORMAL LOW (ref >60)
Glucose, Bld: 87 mg/dL (ref 70–99)
Potassium: 3.6 mmol/L (ref 3.5–5.1)
Sodium: 145 mmol/L (ref 135–145)

## 2019-09-25 LAB — IRON AND TIBC: Iron: 61 ug/dL (ref 28–170)

## 2019-09-25 LAB — RETICULOCYTES
Immature Retic Fract: 30.4 % — ABNORMAL HIGH (ref 2.3–15.9)
RBC.: 2.58 MIL/uL — ABNORMAL LOW (ref 3.87–5.11)
Retic Count, Absolute: 82 10*3/uL (ref 19.0–186.0)
Retic Ct Pct: 3.2 % — ABNORMAL HIGH (ref 0.4–3.1)

## 2019-09-25 LAB — VITAMIN B12: Vitamin B-12: 733 pg/mL (ref 180–914)

## 2019-09-25 LAB — HEMOGLOBIN AND HEMATOCRIT, BLOOD
HCT: 26.2 % — ABNORMAL LOW (ref 36.0–46.0)
Hemoglobin: 8.8 g/dL — ABNORMAL LOW (ref 12.0–15.0)

## 2019-09-25 LAB — FOLATE: Folate: 19.5 ng/mL (ref >5.9)

## 2019-09-25 LAB — PREPARE RBC (CROSSMATCH)

## 2019-09-25 LAB — PHOSPHORUS: Phosphorus: 2.2 mg/dL — ABNORMAL LOW (ref 2.5–4.6)

## 2019-09-25 LAB — MAGNESIUM: Magnesium: 1.7 mg/dL (ref 1.7–2.4)

## 2019-09-25 LAB — FERRITIN: Ferritin: 208 ng/mL (ref 11–307)

## 2019-09-25 MED ORDER — SODIUM CHLORIDE 0.9% IV SOLUTION: Freq: Once | INTRAVENOUS | Status: AC

## 2019-09-25 NOTE — Progress Notes (Signed)
PROGRESS NOTE  [name] DQQ:[id number] DOB: 05/26/1951   PCP: Ann Held, DO  Patient is from: home  DOA: 09/17/2019 LOS: 8  Brief Narrative / Interim history: 69 year old female with history of DM-2, CAD s/p PCI, recent diagnosis of DVT and COVID-19 about 2 months ago presenting with generalized weakness.  In ED, she was hypotensive with lactic acidosis and AKI.  Admitted to ICU with septic shock from UTI requiring pressors.  Transferred to [facility] care on 09/21/2019.  She completed antibiotic course for multidrug-resistant E. coli UTI and possible aspiration pneumonia.  Patient had interval development of bilateral vision change on 3/2.  CT head without significant finding.  Subjective: No major events overnight or this morning.  Continues to endorse blurry vision but improved.  She says she always have poor vision from right eye.  Blurry vision improves with covering to the right eye.  Denies headache or focal neuro deficit.  Hemoglobin dropped.  She is not sure about melena or hematochezia.  Objective: Vitals:   09/25/19 0344 09/25/19 0348 09/25/19 0950 09/25/19 1128  BP:  102/65 (!) 104/53 90/64  Pulse:  87  95  Resp:  19 18 18   Temp:  98.6 F (37 C) 98.2 F (36.8 C) 98.3 F (36.8 C)  TempSrc:  Oral Oral Oral  SpO2:  100% 100% 97%  Weight: 100.1 kg     Height:        Intake/Output Summary (Last 24 hours) at 09/25/2019 1410 Last data filed at 09/24/2019 1605 Gross per 24 hour  Intake 202.35 ml  Output --  Net 202.35 ml   Filed Weights   09/21/19 0500 09/22/19 0500 09/25/19 0344  Weight: 104.8 kg 102.7 kg 100.1 kg    Examination:  GENERAL: No acute distress.  Appears well.  HEENT: MMM.  Vision and hearing grossly intact.  NECK: Supple.  No apparent JVD.  RESP:  No IWOB. Good air movement bilaterally. CVS:  RRR. Heart sounds normal.  ABD/GI/GU: Bowel sounds present. Soft. Non tender.  MSK/EXT:  Moves extremities. No apparent deformity. No edema.  SKIN: no  apparent skin lesion or wound NEURO: Awake, alert and oriented appropriately.  PERRL.  EOMI.  Motor 3/5 in RLE due to pain in her knees and hips. PSYCH: Calm. Normal affect.  Procedures:  None  Assessment & Plan: Septic shock due to multidrug-resistant E. coli UTI -Completed antibiotic course.  Had a mild temp of leukocytosis that has improved.  Acute blurry vision: improves with covering the right eye but does not resolve completely.  PERRL and EOMI on exam.  CT head without acute finding. -We will obtain MRI brain  DM-2 with hyperglycemia Recent Labs    09/25/19 0837 09/25/19 0858 09/25/19 1127  GLUCAP 59* 77 130*  -Discontinue Levemir -Continue sliding scale insulin  History of CAD s/p PCI/stent in 08/2018: No anginal symptoms -Continue BB and Eliquis -Discontinue Plavix-she is more than a year out.   Anemia of chronic disease/iron deficiency anemia?  Not sure about melena or hematochezia. -Check FOBT and anemia panel -Transfuse 1 unit  CKD-3 with hyperkalemia: Stable -Continue monitoring  History of DVT -Continue Eliquis   GERD/Gastritis: EGD on 09/04/2019. -Continue PPI and Carafate  Depression: no SI or crisis -Zoloft 25 mg daily per psych. May increase to 50 mg in two to three weeks as needed   Pressure Injury 09/17/19 Sacrum Medial Stage 2 -  Partial thickness loss of dermis presenting as a shallow open injury with a red,  pink wound bed without slough. (Active)  09/17/19 2307  Location: Sacrum  Location Orientation: Medial  Staging: Stage 2 -  Partial thickness loss of dermis presenting as a shallow open injury with a red, pink wound bed without slough.  Wound Description (Comments):   Present on Admission: Yes     Nutrition Problem: Inadequate oral intake Etiology: dysphagia, early satiety, nausea  Signs/Symptoms: per patient/family report  Interventions: Tube feeding   DVT prophylaxis: On Eliquis Code Status: Full code Family Communication:  Patient and/or RN. Available if any question.  Discharge barrier: Drop in hemoglobin requiring transfusion and acute blurry vision.  Further evaluation for both conditions as above. Patient is from: Home Final disposition: SNF when medically stable  Consultants: Psychiatry   Microbiology summarized: 2/23-COVID-19 negative 2/23-blood cultures negative so far 2/24-urine culture with multidrug-resistant E. coli  Sch Meds:  Scheduled Meds: . sodium chloride   Intravenous Once  . apixaban  5 mg Oral BID  . bacitracin-polymyxin b   Right Eye TID  . Chlorhexidine Gluconate Cloth  6 each Topical Daily  . clopidogrel  75 mg Oral Daily  . feeding supplement (ENSURE ENLIVE)  237 mL Oral TID BM  . feeding supplement (PRO-STAT SUGAR FREE 64)  30 mL Per Tube BID  . gabapentin  200 mg Oral TID  . insulin aspart  0-15 Units Subcutaneous TID WC  . metoprolol tartrate  12.5 mg Oral BID  . midodrine  10 mg Oral TID WC  . nutrition supplement (JUVEN)  1 packet Per Tube BID BM  . oxybutynin  5 mg Oral QHS  . pantoprazole  40 mg Oral Daily  . rosuvastatin  20 mg Oral q1800  . sertraline  50 mg Oral Daily  . sodium chloride flush  10-40 mL Intracatheter Q12H  . sucralfate  1 g Oral BID   Continuous Infusions: . sodium chloride Stopped (09/24/19 1255)   PRN Meds:.acetaminophen **OR** acetaminophen, albuterol, magic mouthwash w/lidocaine, metoCLOPramide, ondansetron **OR** ondansetron (ZOFRAN) IV, sodium chloride flush, traMADol  Antimicrobials: Anti-infectives (From admission, onward)   Start     Dose/Rate Route Frequency Ordered Stop   09/22/19 1200  fosfomycin (MONUROL) packet 3 g     3 g Oral  Once 09/22/19 1135 09/22/19 1300   09/20/19 2000  Ampicillin-Sulbactam (UNASYN) 3 g in sodium chloride 0.9 % 100 mL IVPB  Status:  Discontinued     3 g 200 mL/hr over 30 Minutes Intravenous Every 12 hours 09/20/19 1422 09/22/19 1135   09/19/19 2030  vancomycin (VANCOREADY) IVPB 1250 mg/250 mL   Status:  Discontinued     1,250 mg 166.7 mL/hr over 90 Minutes Intravenous Every 48 hours 09/17/19 2027 09/20/19 0910   09/18/19 1000  ceFEPIme (MAXIPIME) 2 g in sodium chloride 0.9 % 100 mL IVPB  Status:  Discontinued     2 g 200 mL/hr over 30 Minutes Intravenous Every 12 hours 09/18/19 0937 09/18/19 0950   09/18/19 1000  meropenem (MERREM) 1 g in sodium chloride 0.9 % 100 mL IVPB  Status:  Discontinued     1 g 200 mL/hr over 30 Minutes Intravenous Every 12 hours 09/18/19 0950 09/20/19 1422   09/18/19 0615  metroNIDAZOLE (FLAGYL) IVPB 500 mg  Status:  Discontinued     500 mg 100 mL/hr over 60 Minutes Intravenous Every 8 hours 09/18/19 0609 09/18/19 0611   09/17/19 2030  aztreonam (AZACTAM) 2 g in sodium chloride 0.9 % 100 mL IVPB  Status:  Discontinued  2 g 200 mL/hr over 30 Minutes Intravenous  Once 09/17/19 2021 09/17/19 2027   09/17/19 2030  metroNIDAZOLE (FLAGYL) IVPB 500 mg     500 mg 100 mL/hr over 60 Minutes Intravenous  Once 09/17/19 2021 09/17/19 2140   09/17/19 2030  vancomycin (VANCOCIN) IVPB 1000 mg/200 mL premix  Status:  Discontinued     1,000 mg 200 mL/hr over 60 Minutes Intravenous  Once 09/17/19 2021 09/17/19 2027   09/17/19 2030  vancomycin (VANCOREADY) IVPB 2000 mg/400 mL     2,000 mg 200 mL/hr over 120 Minutes Intravenous  Once 09/17/19 2027 09/17/19 2356   09/17/19 2030  ceFEPIme (MAXIPIME) 2 g in sodium chloride 0.9 % 100 mL IVPB  Status:  Discontinued     2 g 200 mL/hr over 30 Minutes Intravenous Every 12 hours 09/17/19 2027 09/18/19 0611       I have personally reviewed the following labs and images: CBC: Recent Labs  Lab 09/19/19 0528 09/19/19 1205 09/21/19 0520 09/22/19 0425 09/23/19 0453 09/24/19 0438 09/25/19 0454  WBC 11.3*   < > 19.3* 18.6* 19.3* 15.5* 11.8*  NEUTROABS 9.2*  --   --   --   --   --   --   HGB 8.8*   < > 8.3* 8.5* 9.0* 8.0* 7.7*  HCT 26.0*   < > 24.8* 25.6* 26.2* 24.8* 23.4*  MCV 86.4   < > 87.0 88.6 86.2 89.2 88.0  PLT  248   < > 230 258 309 318 345   < > = values in this interval not displayed.   BMP &GFR Recent Labs  Lab 09/21/19 0520 09/22/19 0425 09/23/19 0453 09/24/19 0438 09/25/19 0454  NA 140 141 143 146* 145  K 3.9 3.6 3.2* 3.1* 3.6  CL 109 108 109 111 110  CO2 24 25 26 28 29   GLUCOSE 128* 92 67* 84 87  BUN 13 16 19 21 17   CREATININE 1.53* 1.42* 1.07* 1.01* 1.08*  CALCIUM 7.0* 7.0* 7.4* 7.1* 7.0*  MG 1.8 1.8 2.1 1.6* 1.7  PHOS 2.1* 3.8 2.8 2.7 2.2*   Estimated Creatinine Clearance: 60.3 mL/min (A) (by C-G formula based on SCr of 1.08 mg/dL (H)). Liver & Pancreas: Recent Labs  Lab 09/20/19 0559 09/21/19 0520  ALBUMIN 1.2* 1.2*   No results for input(s): LIPASE, AMYLASE in the last 168 hours. No results for input(s): AMMONIA in the last 168 hours. Diabetic: No results for input(s): HGBA1C in the last 72 hours. Recent Labs  Lab 09/24/19 2344 09/25/19 0345 09/25/19 0837 09/25/19 0858 09/25/19 1127  GLUCAP 168* 104* 59* 77 130*   Cardiac Enzymes: No results for input(s): CKTOTAL, CKMB, CKMBINDEX, TROPONINI in the last 168 hours. No results for input(s): PROBNP in the last 8760 hours. Coagulation Profile: No results for input(s): INR, PROTIME in the last 168 hours. Thyroid Function Tests: No results for input(s): TSH, T4TOTAL, FREET4, T3FREE, THYROIDAB in the last 72 hours. Lipid Profile: No results for input(s): CHOL, HDL, LDLCALC, TRIG, CHOLHDL, LDLDIRECT in the last 72 hours. Anemia Panel: Recent Labs    09/25/19 1053  VITAMINB12 733  FERRITIN 208  TIBC PENDING  IRON 61  RETICCTPCT 3.2*   Urine analysis:    Component Value Date/Time   COLORURINE YELLOW 09/18/2019 1155   APPEARANCEUR CLEAR 09/18/2019 1155   LABSPEC 1.016 09/18/2019 1155   LABSPEC 1.015 04/06/2015 0939   PHURINE 5.0 09/18/2019 1155   GLUCOSEU NEGATIVE 09/18/2019 1155   [place] 09/18/2019 1155  BILIRUBINUR NEGATIVE 09/18/2019 1155   BILIRUBINUR 1+ 10/01/2018 1146   KETONESUR  NEGATIVE 09/18/2019 1155   PROTEINUR NEGATIVE 09/18/2019 1155   UROBILINOGEN 0.2 10/01/2018 1146   UROBILINOGEN 0.2 04/06/2015 0939   NITRITE NEGATIVE 09/18/2019 1155   LEUKOCYTESUR TRACE (A) 09/18/2019 1155   Sepsis Labs: Invalid input(s): PROCALCITONIN, [place]  Microbiology: Recent Results (from the past 240 hour(s))  Blood culture (routine x 2)     Status: None   Collection Time: 09/17/19  5:10 PM   Specimen: BLOOD  Result Value Ref Range Status   Specimen Description BLOOD LEFT ANTECUBITAL  Final   Special Requests   Final    BOTTLES DRAWN AEROBIC ONLY Blood Culture results may not be optimal due to an inadequate volume of blood received in culture bottles   Culture   Final    NO GROWTH 5 DAYS Performed at [facility], [place] [address] [place], [place] [address]    Report Status 09/22/2019 FINAL  Final  Blood culture (routine x 2)     Status: None   Collection Time: 09/17/19  5:25 PM   Specimen: BLOOD  Result Value Ref Range Status   Specimen Description BLOOD RIGHT ANTECUBITAL  Final   Special Requests   Final    BOTTLES DRAWN AEROBIC AND ANAEROBIC Blood Culture results may not be optimal due to an inadequate volume of blood received in culture bottles   Culture   Final    NO GROWTH 5 DAYS Performed at [facility], [place] [address], [place], [place] [address]    Report Status 09/22/2019 FINAL  Final  SARS CORONAVIRUS 2 (TAT 6-24 HRS) Nasopharyngeal Nasopharyngeal Swab     Status: None   Collection Time: 09/17/19  8:43 PM   Specimen: Nasopharyngeal Swab  Result Value Ref Range Status   SARS Coronavirus 2 NEGATIVE NEGATIVE Final    Comment: (NOTE) SARS-CoV-2 target nucleic acids are NOT DETECTED. The SARS-CoV-2 RNA is generally detectable in upper and lower respiratory specimens during the acute phase of infection. Negative results do not preclude SARS-CoV-2 infection, do not rule out co-infections with other pathogens, and should not be used as  the sole basis for treatment or other patient management decisions. Negative results must be combined with clinical observations, patient history, and epidemiological information. The expected result is Negative. Fact Sheet for Patients: SugarRoll.be Fact Sheet for Healthcare Providers: https://www.woods-mathews.com/ This test is not yet approved or cleared by the [place] FDA and  has been authorized for detection and/or diagnosis of SARS-CoV-2 by FDA under an Emergency Use Authorization (EUA). This EUA will remain  in effect (meaning this test can be used) for the duration of the COVID-19 declaration under Section 56 4(b)(1) of the Act, 21 U.S.C. section 360bbb-3(b)(1), unless the authorization is terminated or revoked sooner. Performed at [facility], [place] [address], [place], [place] [address]   Culture, Urine     Status: Abnormal   Collection Time: 09/18/19 11:55 AM   Specimen: Urine, Clean Catch  Result Value Ref Range Status   Specimen Description URINE, CLEAN CATCH  Final   Special Requests   Final    NONE Performed at [facility], [place] [address], [place], [place] [address]    Culture (A)  Final    >=100,000 COLONIES/mL ESCHERICHIA COLI Confirmed Extended Spectrum Beta-Lactamase Producer (ESBL).  In bloodstream infections from ESBL organisms, carbapenems are preferred over piperacillin/tazobactam. They are shown to have a lower risk of mortality.  Report Status 09/20/2019 FINAL  Final   Organism ID, Bacteria ESCHERICHIA COLI (A)  Final      Susceptibility   Escherichia coli - MIC*    AMPICILLIN >=32 RESISTANT Resistant     CEFAZOLIN >=64 RESISTANT Resistant     CEFTRIAXONE >=64 RESISTANT Resistant     CIPROFLOXACIN >=4 RESISTANT Resistant     GENTAMICIN <=1 SENSITIVE Sensitive     IMIPENEM <=0.25 SENSITIVE Sensitive     NITROFURANTOIN <=16 SENSITIVE Sensitive     TRIMETH/SULFA >=320 RESISTANT Resistant      AMPICILLIN/SULBACTAM 8 SENSITIVE Sensitive     PIP/TAZO <=4 SENSITIVE Sensitive     * >=100,000 COLONIES/mL ESCHERICHIA COLI    Radiology Studies: No results found.   Lebaron Bautch T. [place]  If 7PM-7AM, please contact night-coverage [email or web address] Password [email or web address] 09/25/2019, 2:10 PM

## 2019-09-25 NOTE — TOC Progression Note (Signed)
Transition of Care [facility]) - Progression Note    Patient Details  Name: ALAIJAH GIBLER MRN: [id number] Date of Birth: 06/12/1951  [facility] CM/SW Contact  Pollie Friar, RN Phone Number: 09/25/2019, 2:31 PM  Clinical Narrative:    Continue to await PASAR. Provided bed offers for SNF to daughter.    Expected Discharge Plan: [place] Barriers to Discharge: Continued Medical Work up  Expected Discharge Plan and Services Expected Discharge Plan: [place] In-house Referral: Clinical Social Work Discharge Planning Services: CM Consult Post Acute Care Choice: [place] arrangements for the past 2 months: [place]                                       Social Determinants of Health (SDOH) Interventions    Readmission Risk Interventions Readmission Risk Prevention Plan 09/05/2019 09/05/2019 05/16/2019  Transportation Screening Complete - Complete  Medication Review [employer] photographer) Complete - Complete  PCP or Specialist appointment within 3-5 days of discharge Complete - -  HRI or Home Care Consult Patient refused - Complete  SW Recovery Care/Counseling Consult Complete - Complete  Palliative Care Screening Not Applicable Not Applicable Not Applicable  Skilled Nursing Facility Complete Complete Not Applicable  Some recent data might be hidden

## 2019-09-25 NOTE — Progress Notes (Signed)
Pt HR in 140s. Pt assessed and asymptomatic. Provider notified, HR now in 90s.

## 2019-09-25 NOTE — Consult Note (Signed)
[facility] Face-to-Face Psychiatry Consult   Reason for Consult:  "depression, request for meds." Referring Physician:  Dr Cyndia Skeeters Patient Identification: [name] MRN:  [id number] Principal Diagnosis: Hypotension Diagnosis:  Principal Problem:   Hypotension Active Problems:   Stomach cancer ([place])   SLEEP APNEA, OBSTRUCTIVE   Iron deficiency anemia   Type 2 diabetes with nephropathy (HCC)   CKD (chronic kidney disease) stage 3, GFR 30-59 ml/min (HCC)   History of DVT (deep vein thrombosis)   Pressure injury of skin   Goals of care, counseling/discussion   Palliative care by specialist   DNR (do not resuscitate) discussion   Total Time spent with patient: 30 minutes  Subjective:   [name] is a 69 y.o. female patient. Patient assessed by nurse practitioner. Patient alert and oriented, answers appropriately. Patient states "I was feeling down because yesterday the patient advocate came by and told me to gather my family and make arrangements for my burial." Patient reports " I have been to three nursing facilities and they are all slums, I want to walk in my house and be able to cook my own meals and things."  Patient reports "I sleep good." Patient reports appetite "pretty good."  Patient denies all crisis criteria including suicidal and homicidal ideations as well as auditory visual hallucinations.  Patient denies history of mental illness. Patient gives verbal consent to speak with daughter  [name] [phone]   HPI:  Patient presented to ED with sepsis.  Past Psychiatric History: MDD  Risk to Self:  Denies Risk to Others:  Denies Prior Inpatient Therapy:  Denies Prior Outpatient Therapy:  Denies  Past Medical History:  Past Medical History:  Diagnosis Date  . Asthma   . Degenerative joint disease   . Diabetes mellitus   . Dyspnea   . GERD (gastroesophageal reflux disease)   . Hyperlipidemia    no longer on medication for this  . Hypertension    patient denies  .  Myocardial infarction ([place]) 08/2018  . Neuropathy    bilateral hands and feet  . Sickle cell trait ([place])   . Sleep apnea   . stomach ca dx'd 2010   chemo/xrt comp 05/2009  . TIA (transient ischemic attack) 07/2015    Past Surgical History:  Procedure Laterality Date  . APPENDECTOMY    . CESAREAN SECTION    . COLON SURGERY     colonscopy  . COLONOSCOPY WITH PROPOFOL N/A 06/01/2016   Procedure: COLONOSCOPY WITH PROPOFOL;  Surgeon: Wilford Corner, MD;  Location: [facility] ENDOSCOPY;  Service: Endoscopy;  Laterality: N/A;  . CORONARY STENT INTERVENTION N/A 08/29/2018   Procedure: CORONARY STENT INTERVENTION;  Surgeon: Wellington Hampshire, MD;  Location: [place] CV LAB;  Service: Cardiovascular;  Laterality: N/A;  . CORONARY/GRAFT ACUTE MI REVASCULARIZATION N/A 08/27/2018   Procedure: Coronary/Graft Acute MI Revascularization;  Surgeon: Leonie Man, MD;  Location: [place] CV LAB;  Service: Cardiovascular;  Laterality: N/A;  . ESOPHAGOGASTRODUODENOSCOPY N/A 10/15/2012   Procedure: ESOPHAGOGASTRODUODENOSCOPY (EGD);  Surgeon: Lear Ng, MD;  Location: [facility] ENDOSCOPY;  Service: Endoscopy;  Laterality: N/A;  . ESOPHAGOGASTRODUODENOSCOPY N/A 01/15/2016   Procedure: ESOPHAGOGASTRODUODENOSCOPY (EGD);  Surgeon: Ronald Lobo, MD;  Location: [facility] ENDOSCOPY;  Service: Endoscopy;  Laterality: N/A;  . ESOPHAGOGASTRODUODENOSCOPY (EGD) WITH PROPOFOL N/A 09/04/2019   Procedure: ESOPHAGOGASTRODUODENOSCOPY (EGD) WITH PROPOFOL;  Surgeon: Ronald Lobo, MD;  Location: [place];  Service: Endoscopy;  Laterality: N/A;  possible esophageal dilatation  . HERNIA REPAIR    . LEFT  HEART CATH AND CORONARY ANGIOGRAPHY N/A 08/27/2018   Procedure: LEFT HEART CATH AND CORONARY ANGIOGRAPHY;  Surgeon: Leonie Man, MD;  Location: [place] CV LAB;  Service: Cardiovascular;  Laterality: N/A;  . SHOULDER SURGERY     Left  . TOTAL HIP ARTHROPLASTY     Right  . TOTAL KNEE ARTHROPLASTY     Left   Family  History:  Family History  Problem Relation Age of Onset  . Diabetes Brother   . Alzheimer's disease Father 50  . Cancer Mother 3  . Diabetes Maternal Grandmother   . Cancer Maternal Grandfather        lung  . Healthy Child   . Suicidality Neg Hx   . Depression Neg Hx   . Dementia Neg Hx   . Anxiety disorder Neg Hx    Family Psychiatric  History: Denies Social History:  Social History   Substance and Sexual Activity  Alcohol Use No  . Alcohol/week: 0.0 standard drinks     Social History   Substance and Sexual Activity  Drug Use No    Social History   Socioeconomic History  . Marital status: Divorced    Spouse name: Not on file  . Number of children: 4  . Years of education: Not on file  . Highest education level: High school graduate  Occupational History  . Occupation: Retired Product/process development scientist: [employer]  . Smoking status: Former Smoker    Packs/day: 1.00    Years: 15.00    Pack years: 15.00    Types: Cigarettes    Start date: 09/27/1973    Quit date: 10/15/1988    Years since quitting: 30.9  . Smokeless tobacco: Never Used  . Tobacco comment: quit smoking 14 years ago  Substance and Sexual Activity  . Alcohol use: No    Alcohol/week: 0.0 standard drinks  . Drug use: No  . Sexual activity: Not on file  Other Topics Concern  . Not on file  Social History Narrative   Lives alone, 4 children live near her. She would like to move to an independent retirement community   Social Determinants of Radio broadcast assistant Strain: Low Risk   . Difficulty of Paying Living Expenses: Not very hard  Food Insecurity: No Food Insecurity  . Worried About Charity fundraiser in the Last Year: Never true  . Ran Out of Food in the Last Year: Never true  Transportation Needs: No Transportation Needs  . Lack of Transportation (Medical): No  . Lack of Transportation (Non-Medical): No  Physical Activity: Insufficiently Active  . Days of Exercise per  Week: 3 days  . Minutes of Exercise per Session: 30 min  Stress: No Stress Concern Present  . Feeling of Stress : Not at all  Social Connections: Somewhat Isolated  . Frequency of Communication with Friends and Family: More than three times a week  . Frequency of Social Gatherings with Friends and Family: More than three times a week  . Attends Religious Services: More than 4 times per year  . Active Member of Clubs or Organizations: No  . Attends Archivist Meetings: Never  . Marital Status: Divorced   Additional Social History:    Allergies:   Allergies  Allergen Reactions  . Cephalexin Itching, Rash and Other (See Comments)    Sensation of throat closing. Tolerated pip/tazo, cefepime, ceftriaxone before   . Adhesive [Tape] Other (See Comments)    Burn  Skin  . Codeine Other (See Comments)    paranoid  . Zocor [Simvastatin - High Dose] Other (See Comments)    Muscle spasms    Labs:  Results for orders placed or performed during the hospital encounter of 09/17/19 (from the past 48 hour(s))  Glucose, capillary     Status: None   Collection Time: 09/23/19 10:38 AM  Result Value Ref Range   Glucose-Capillary 88 70 - 99 mg/dL    Comment: Glucose reference range applies only to samples taken after fasting for at least 8 hours.  Glucose, capillary     Status: None   Collection Time: 09/23/19 12:48 PM  Result Value Ref Range   Glucose-Capillary 97 70 - 99 mg/dL    Comment: Glucose reference range applies only to samples taken after fasting for at least 8 hours.  Glucose, capillary     Status: None   Collection Time: 09/23/19  4:05 PM  Result Value Ref Range   Glucose-Capillary 84 70 - 99 mg/dL    Comment: Glucose reference range applies only to samples taken after fasting for at least 8 hours.  Glucose, capillary     Status: None   Collection Time: 09/23/19  8:47 PM  Result Value Ref Range   Glucose-Capillary 83 70 - 99 mg/dL    Comment: Glucose reference range  applies only to samples taken after fasting for at least 8 hours.  Glucose, capillary     Status: None   Collection Time: 09/24/19 12:45 AM  Result Value Ref Range   Glucose-Capillary 71 70 - 99 mg/dL    Comment: Glucose reference range applies only to samples taken after fasting for at least 8 hours.  Glucose, capillary     Status: None   Collection Time: 09/24/19  4:28 AM  Result Value Ref Range   Glucose-Capillary 83 70 - 99 mg/dL    Comment: Glucose reference range applies only to samples taken after fasting for at least 8 hours.  Phosphorus     Status: None   Collection Time: 09/24/19  4:38 AM  Result Value Ref Range   Phosphorus 2.7 2.5 - 4.6 mg/dL    Comment: Performed at [facility], [place] [address], [place], [place] [address]  Basic metabolic panel     Status: Abnormal   Collection Time: 09/24/19  4:38 AM  Result Value Ref Range   Sodium 146 (H) 135 - 145 mmol/L   Potassium 3.1 (L) 3.5 - 5.1 mmol/L   Chloride 111 98 - 111 mmol/L   CO2 28 22 - 32 mmol/L   Glucose, Bld 84 70 - 99 mg/dL    Comment: Glucose reference range applies only to samples taken after fasting for at least 8 hours.   BUN 21 8 - 23 mg/dL   Creatinine, Ser 1.01 (H) 0.44 - 1.00 mg/dL   Calcium 7.1 (L) 8.9 - 10.3 mg/dL   GFR calc non Af Amer 58 (L) >60 mL/min   GFR calc Af Amer >60 >60 mL/min   Anion gap 7 5 - 15    Comment: Performed at [facility] [address] [place], [place] [address]  Magnesium     Status: Abnormal   Collection Time: 09/24/19  4:38 AM  Result Value Ref Range   Magnesium 1.6 (L) 1.7 - 2.4 mg/dL    Comment: Performed at [facility] [address], [place], [place] [address]  CBC     Status: Abnormal  Collection Time: 09/24/19  4:38 AM  Result Value Ref Range   WBC 15.5 (H) 4.0 - 10.5 K/uL   RBC 2.78 (L) 3.87 - 5.11 MIL/uL   Hemoglobin 8.0 (L) 12.0 - 15.0 g/dL   HCT 24.8 (L) 36.0 - 46.0 %   MCV 89.2 80.0 - 100.0 fL   MCH 28.8 26.0 - 34.0 pg   MCHC 32.3  30.0 - 36.0 g/dL   RDW 17.1 (H) 11.5 - 15.5 %   Platelets 318 150 - 400 K/uL   nRBC 0.5 (H) 0.0 - 0.2 %    Comment: Performed at [facility] [address] [place], [place] [address]  Glucose, capillary     Status: Abnormal   Collection Time: 09/24/19 12:01 PM  Result Value Ref Range   Glucose-Capillary 142 (H) 70 - 99 mg/dL    Comment: Glucose reference range applies only to samples taken after fasting for at least 8 hours.  Glucose, capillary     Status: Abnormal   Collection Time: 09/24/19  3:50 PM  Result Value Ref Range   Glucose-Capillary 140 (H) 70 - 99 mg/dL    Comment: Glucose reference range applies only to samples taken after fasting for at least 8 hours.   Comment 1 Notify RN    Comment 2 Document in Chart   Glucose, capillary     Status: Abnormal   Collection Time: 09/24/19  8:03 PM  Result Value Ref Range   Glucose-Capillary 117 (H) 70 - 99 mg/dL    Comment: Glucose reference range applies only to samples taken after fasting for at least 8 hours.   Comment 1 Notify RN    Comment 2 Document in Chart   Glucose, capillary     Status: Abnormal   Collection Time: 09/24/19 11:44 PM  Result Value Ref Range   Glucose-Capillary 168 (H) 70 - 99 mg/dL    Comment: Glucose reference range applies only to samples taken after fasting for at least 8 hours.   Comment 1 Notify RN    Comment 2 Document in Chart   Glucose, capillary     Status: Abnormal   Collection Time: 09/25/19  3:45 AM  Result Value Ref Range   Glucose-Capillary 104 (H) 70 - 99 mg/dL    Comment: Glucose reference range applies only to samples taken after fasting for at least 8 hours.   Comment 1 Notify RN    Comment 2 Document in Chart   Phosphorus     Status: Abnormal   Collection Time: 09/25/19  4:54 AM  Result Value Ref Range   Phosphorus 2.2 (L) 2.5 - 4.6 mg/dL    Comment: Performed at [facility] [address] [place], [place] [address]  Basic metabolic panel     Status: Abnormal    Collection Time: 09/25/19  4:54 AM  Result Value Ref Range   Sodium 145 135 - 145 mmol/L   Potassium 3.6 3.5 - 5.1 mmol/L   Chloride 110 98 - 111 mmol/L   CO2 29 22 - 32 mmol/L   Glucose, Bld 87 70 - 99 mg/dL    Comment: Glucose reference range applies only to samples taken after fasting for at least 8 hours.   BUN 17 8 - 23 mg/dL   Creatinine, Ser 1.08 (H) 0.44 - 1.00 mg/dL   Calcium 7.0 (L) 8.9 - 10.3 mg/dL   GFR calc non Af Amer 53 (L) >60 mL/min   GFR calc Af Amer >60 >60  mL/min   Anion gap 6 5 - 15    Comment: Performed at [facility] [address] [place], [place] [address]  Magnesium     Status: None   Collection Time: 09/25/19  4:54 AM  Result Value Ref Range   Magnesium 1.7 1.7 - 2.4 mg/dL    Comment: Performed at [facility] [address], [place], [place] [address]  CBC     Status: Abnormal   Collection Time: 09/25/19  4:54 AM  Result Value Ref Range   WBC 11.8 (H) 4.0 - 10.5 K/uL   RBC 2.66 (L) 3.87 - 5.11 MIL/uL   Hemoglobin 7.7 (L) 12.0 - 15.0 g/dL   HCT 23.4 (L) 36.0 - 46.0 %   MCV 88.0 80.0 - 100.0 fL   MCH 28.9 26.0 - 34.0 pg   MCHC 32.9 30.0 - 36.0 g/dL   RDW 17.2 (H) 11.5 - 15.5 %   Platelets 345 150 - 400 K/uL   nRBC 0.6 (H) 0.0 - 0.2 %    Comment: Performed at [facility] [address] [place], [place] [address]  Glucose, capillary     Status: Abnormal   Collection Time: 09/25/19  8:37 AM  Result Value Ref Range   Glucose-Capillary 59 (L) 70 - 99 mg/dL    Comment: Glucose reference range applies only to samples taken after fasting for at least 8 hours.  Glucose, capillary     Status: None   Collection Time: 09/25/19  8:58 AM  Result Value Ref Range   Glucose-Capillary 77 70 - 99 mg/dL    Comment: Glucose reference range applies only to samples taken after fasting for at least 8 hours.    Current Facility-Administered Medications  Medication Dose Route Frequency Provider Last Rate Last Admin  . 0.9 %  sodium chloride  infusion  250 mL Intravenous Continuous Shelly Coss, MD   Stopped at 09/24/19 1255  . acetaminophen (TYLENOL) tablet 650 mg  650 mg Oral Q6H PRN Rise Patience, MD   650 mg at 09/22/19 5956   Or  . acetaminophen (TYLENOL) suppository 650 mg  650 mg Rectal Q6H PRN Rise Patience, MD      . albuterol (PROVENTIL) (2.5 MG/3ML) 0.083% nebulizer solution 2.5 mg  2.5 mg Nebulization Q6H PRN Rise Patience, MD      . apixaban [name]) tablet 5 mg  5 mg Oral BID Rise Patience, MD   5 mg at 09/24/19 2125  . bacitracin-polymyxin b (POLYSPORIN) ophthalmic ointment   Right Eye TID Shelly Coss, MD   Given at 09/24/19 2128  . Chlorhexidine Gluconate Cloth 2 % PADS 6 each  6 each Topical Daily Rise Patience, MD   6 each at 09/24/19 1724  . clopidogrel (PLAVIX) tablet 75 mg  75 mg Oral Daily Rise Patience, MD   75 mg at 09/24/19 3875  . feeding supplement (ENSURE ENLIVE) (ENSURE ENLIVE) liquid 237 mL  237 mL Oral TID BM Ollis, Brandi L, NP   237 mL at 09/24/19 2030  . feeding supplement (PRO-STAT SUGAR FREE 64) liquid 30 mL  30 mL Per Tube BID Collene Gobble, MD   30 mL at 09/23/19 2121  . gabapentin (NEURONTIN) capsule 200 mg  200 mg Oral TID Jennelle Human B, NP   200 mg at 09/24/19 2124  . insulin aspart (novoLOG) injection 0-15 Units  0-15 Units Subcutaneous TID WC Elgergawy, Silver Huguenin, MD   2  Units at 09/24/19 1713  . insulin detemir (LEVEMIR) injection 6 Units  6 Units Subcutaneous BID Elgergawy, Silver Huguenin, MD   6 Units at 09/24/19 2123  . magic mouthwash w/lidocaine  10 mL Oral TID PRN Jennelle Human B, NP   10 mL at 09/20/19 1040  . metoCLOPramide (REGLAN) tablet 5 mg  5 mg Oral Q8H PRN Jennelle Human B, NP      . metoprolol tartrate (LOPRESSOR) tablet 12.5 mg  12.5 mg Oral BID Hosie Poisson, MD   12.5 mg at 09/24/19 2126  . midodrine (PROAMATINE) tablet 10 mg  10 mg Oral TID WC Audria Nine, DO   10 mg at 09/24/19 1714  . nutrition supplement (JUVEN)  (JUVEN) powder packet 1 packet  1 packet Per Tube BID BM Collene Gobble, MD   1 packet at 09/23/19 1041  . ondansetron (ZOFRAN) tablet 4 mg  4 mg Oral Q6H PRN Jennelle Human B, NP       Or  . ondansetron (ZOFRAN) injection 4 mg  4 mg Intravenous Q6H PRN Jennelle Human B, NP   4 mg at 09/24/19 1516  . oxybutynin (DITROPAN-XL) 24 hr tablet 5 mg  5 mg Oral QHS Collene Gobble, MD   5 mg at 09/24/19 2125  . pantoprazole (PROTONIX) EC tablet 40 mg  40 mg Oral Daily Rise Patience, MD   40 mg at 09/24/19 6073  . rosuvastatin (CRESTOR) tablet 20 mg  20 mg Oral q1800 Rise Patience, MD   20 mg at 09/24/19 1714  . sertraline (ZOLOFT) tablet 50 mg  50 mg Oral Daily Rise Patience, MD   50 mg at 09/24/19 0825  . sodium chloride flush (NS) 0.9 % injection 10-40 mL  10-40 mL Intracatheter Q12H Collene Gobble, MD   10 mL at 09/24/19 2126  . sodium chloride flush (NS) 0.9 % injection 10-40 mL  10-40 mL Intracatheter PRN Collene Gobble, MD      . sucralfate (CARAFATE) tablet 1 g  1 g Oral BID Rise Patience, MD   1 g at 09/24/19 2126  . traMADol (ULTRAM) tablet 50 mg  50 mg Oral Q6H PRN Shelly Coss, MD   50 mg at 09/24/19 7106   Facility-Administered Medications Ordered in Other Encounters  Medication Dose Route Frequency Provider Last Rate Last Admin  . sodium chloride flush (NS) 0.9 % injection 10 mL  10 mL Intravenous PRN [place], Sarah M, NP   10 mL at 09/13/16 1146    Musculoskeletal: Strength & Muscle Tone: decreased Gait & Station: unable to stand Patient leans: N/A  Psychiatric Specialty Exam: Physical Exam  Nursing note and vitals reviewed. Constitutional: She is oriented to person, place, and time. She appears well-developed and well-nourished.  HENT:  Head: Normocephalic.  Cardiovascular: Normal rate.  Respiratory: Effort normal.  Neurological: She is alert and oriented to person, place, and time.  Psychiatric: Her speech is normal and behavior is normal.  Judgment and thought content normal. Cognition and memory are normal. She exhibits a depressed mood.    Review of Systems  Constitutional: Negative.   HENT: Negative.   Eyes: Negative.   Respiratory: Negative.   Cardiovascular: Negative.   Gastrointestinal: Negative.   Genitourinary: Negative.   Musculoskeletal: Positive for gait problem (I couldn't walk for the past week).  Skin: Negative.     Blood pressure 102/65, pulse 87, temperature 98.6 F (37 C), temperature source Oral, resp. rate 19, height 5' 6"  (1.676  m), weight 100.1 kg, SpO2 100 %.Body mass index is 35.62 kg/m.  General Appearance: Fairly Groomed  Eye Contact:  Good  Speech:  Clear and Coherent and Normal Rate  Volume:  Normal  Mood:  Depressed  Affect:  Congruent and Depressed  Thought Process:  Coherent, Goal Directed and Descriptions of Associations: Intact  Orientation:  Full (Time, Place, and Person)  Thought Content:  WDL and Logical  Suicidal Thoughts:  No  Homicidal Thoughts:  No  Memory:  Immediate;   Good Recent;   Good Remote;   Good  Judgement:  Fair  Insight:  Good  Psychomotor Activity:  Normal  Concentration:  Concentration: Good and Attention Span: Good  Recall:  Good  Fund of Knowledge:  Good  Language:  Good  Akathisia:  No  Handed:  Right  AIMS (if indicated):     Assets:  Communication Skills Desire for Improvement Financial Resources/Insurance Housing Intimacy Leisure Time Resilience Social Support Talents/Skills Transportation  ADL's:  Impaired  Cognition:  WNL  Sleep:        Treatment Plan Summary: Medication management recommend consider Zoloft 25 mg daily x7 days if tolerated well consider increase dose to 50 mg daily. Case discussed with Dr Dwyane Dee.  Disposition: No evidence of imminent risk to self or others at present.   Patient does not meet criteria for psychiatric inpatient admission. Supportive therapy provided about ongoing stressors.  Emmaline Kluver,  FNP 09/25/2019 9:09 AM

## 2019-09-25 NOTE — Progress Notes (Signed)
OT Cancellation Note  Patient Details Name: [name] MRN: [id number] DOB: Feb 15, 1951   Cancelled Treatment:    Reason Eval/Treat Not Completed: Patient at procedure or test/ unavailable. Pt off unit, will follow and see as able.   Jolaine Artist, OT Acute Rehabilitation Services Pager [phone] Office [phone]   Delight Stare 09/25/2019, 2:20 PM

## 2019-09-25 NOTE — Progress Notes (Signed)
Patient refused HPN change. Patient RN notified.

## 2019-09-26 DIAGNOSIS — E870 Hyperosmolality and hypernatremia: Secondary | ICD-10-CM

## 2019-09-26 LAB — BPAM RBC
Blood Product Expiration Date: 202104012359
ISSUE DATE / TIME: 202103031626
Unit Type and Rh: 5100

## 2019-09-26 LAB — BASIC METABOLIC PANEL
Anion gap: 5 (ref 5–15)
BUN: 14 mg/dL (ref 8–23)
CO2: 29 mmol/L (ref 22–32)
Calcium: 7 mg/dL — ABNORMAL LOW (ref 8.9–10.3)
Chloride: 113 mmol/L — ABNORMAL HIGH (ref 98–111)
Creatinine, Ser: 0.92 mg/dL (ref 0.44–1.00)
GFR calc Af Amer: >60 mL/min (ref >60)
GFR calc non Af Amer: >60 mL/min (ref >60)
Glucose, Bld: 166 mg/dL — ABNORMAL HIGH (ref 70–99)
Potassium: 4.1 mmol/L (ref 3.5–5.1)
Sodium: 147 mmol/L — ABNORMAL HIGH (ref 135–145)

## 2019-09-26 LAB — TYPE AND SCREEN
ABO/RH(D): O POS
Antibody Screen: NEGATIVE
Unit division: 0

## 2019-09-26 LAB — GLUCOSE, CAPILLARY
Glucose-Capillary: 160 mg/dL — ABNORMAL HIGH (ref 70–99)
Glucose-Capillary: 162 mg/dL — ABNORMAL HIGH (ref 70–99)
Glucose-Capillary: 192 mg/dL — ABNORMAL HIGH (ref 70–99)
Glucose-Capillary: 193 mg/dL — ABNORMAL HIGH (ref 70–99)
Glucose-Capillary: 199 mg/dL — ABNORMAL HIGH (ref 70–99)
Glucose-Capillary: 143 mg/dL — ABNORMAL HIGH (ref 70–99)

## 2019-09-26 LAB — CBC
HCT: 26.2 % — ABNORMAL LOW (ref 36.0–46.0)
Hemoglobin: 8.6 g/dL — ABNORMAL LOW (ref 12.0–15.0)
MCH: 29.2 pg (ref 26.0–34.0)
MCHC: 32.8 g/dL (ref 30.0–36.0)
MCV: 88.8 fL (ref 80.0–100.0)
Platelets: 339 10*3/uL (ref 150–400)
RBC: 2.95 MIL/uL — ABNORMAL LOW (ref 3.87–5.11)
RDW: 16.3 % — ABNORMAL HIGH (ref 11.5–15.5)
WBC: 10 10*3/uL (ref 4.0–10.5)
nRBC: 0.3 % — ABNORMAL HIGH (ref 0.0–0.2)

## 2019-09-26 LAB — MAGNESIUM: Magnesium: 1.7 mg/dL (ref 1.7–2.4)

## 2019-09-26 LAB — SARS CORONAVIRUS 2 (TAT 6-24 HRS): SARS Coronavirus 2: NEGATIVE

## 2019-09-26 LAB — PHOSPHORUS: Phosphorus: 2.1 mg/dL — ABNORMAL LOW (ref 2.5–4.6)

## 2019-09-26 MED ORDER — INSULIN DETEMIR 100 UNIT/ML ~~LOC~~ SOLN
5.0000 [IU] | Freq: Two times a day (BID) | SUBCUTANEOUS | Status: DC
  Administered 2019-09-26 (×2): 5 [IU] via SUBCUTANEOUS
  Filled 2019-09-26 (×4): qty 0.05

## 2019-09-26 MED ORDER — INSULIN ASPART 100 UNIT/ML ~~LOC~~ SOLN: 0.0000 [IU] | Freq: Every day | SUBCUTANEOUS | Status: DC

## 2019-09-26 MED ORDER — INSULIN ASPART 100 UNIT/ML ~~LOC~~ SOLN
0.0000 [IU] | Freq: Three times a day (TID) | SUBCUTANEOUS | Status: DC
  Administered 2019-09-26: 3 [IU] via SUBCUTANEOUS

## 2019-09-26 MED ORDER — MAGNESIUM SULFATE 2 GM/50ML IV SOLN
2.0000 g | Freq: Once | INTRAVENOUS | Status: AC
  Administered 2019-09-26: 2 g via INTRAVENOUS
  Filled 2019-09-26: qty 50

## 2019-09-26 NOTE — TOC Progression Note (Addendum)
Transition of Care [facility]) - Progression Note    Patient Details  Name: [name] MRN: [id number] Date of Birth: 30-Dec-1950  [facility] of [facility]) CM/SW [place], [place] Work Phone Number: 09/26/2019, 12:03 PM  Clinical Narrative:     MSW Intern spoke with pt's daughter, Alanna. She stated that she was not ready to make a decision on SNF placement yet and would like to do more research and discuss with her mother. She said she would follow up with a decision. SW will continue to follow.  Addendum 2:30p 09/26/19  Alanna called back to inform SW that she has chosen [facility] and will update her mother. Juliann Pulse at [facility] was notified.  Expected Discharge Plan: [place] Barriers to Discharge: Continued Medical Work up  Expected Discharge Plan and Services Expected Discharge Plan: [place] In-house Referral: Clinical Social Work Discharge Planning Services: CM Consult Post Acute Care Choice: [place] arrangements for the past 2 months: [place]                                       Social Determinants of Health (SDOH) Interventions    Readmission Risk Interventions Readmission Risk Prevention Plan 09/05/2019 09/05/2019 05/16/2019  Transportation Screening Complete - Complete  Medication Review Press photographer) Complete - Complete  PCP or Specialist appointment within 3-5 days of discharge Complete - -  HRI or Home Care Consult Patient refused - Complete  SW Recovery Care/Counseling Consult Complete - Complete  Palliative Care Screening Not Applicable Not Applicable Not Applicable  Skilled Nursing Facility Complete Complete Not Applicable  Some recent data might be hidden

## 2019-09-26 NOTE — Progress Notes (Signed)
PROGRESS NOTE  [name] KDT:[id number] DOB: 1950/11/22   PCP: Ann Held, DO  Patient is from: home  DOA: 09/17/2019 LOS: 9  Brief Narrative / Interim history: 69 year old female with history of DM-2, CAD s/p PCI, recent diagnosis of DVT and COVID-19 about 2 months ago presenting with generalized weakness.  In ED, she was hypotensive with lactic acidosis and AKI.  Admitted to ICU with septic shock from UTI requiring pressors.  Transferred to [facility] care on 09/21/2019.  She completed antibiotic course for multidrug-resistant E. coli UTI and possible aspiration pneumonia.  Patient had interval development of bilateral vision change, right greater than left on 3/2.  CT head and MRI brain without significant finding.  Vision change improving.  Subjective: No major events overnight or this morning.  Reports improvement in his vision mainly in the left.  Denies headache, focal numbness, tingling or weakness.  Denies chest pain, dyspnea, GI or UTI symptoms.  Not sure about melena or hematochezia.  Objective: Vitals:   09/26/19 0330 09/26/19 0451 09/26/19 0900 09/26/19 1213  BP: 94/67  108/63 95/60  Pulse: 90   80  Resp: 18  18 18   Temp: 98.6 F (37 C)  98.5 F (36.9 C) 99 F (37.2 C)  TempSrc: Oral  Oral Oral  SpO2: 100%   100%  Weight:  98 kg    Height:       No intake or output data in the 24 hours ending 09/26/19 1310 Filed Weights   09/22/19 0500 09/25/19 0344 09/26/19 0451  Weight: 102.7 kg 100.1 kg 98 kg    Examination:  GENERAL: No acute distress.  Appears well.  HEENT: MMM.  Vision and hearing grossly intact.  NECK: Supple.  No apparent JVD.  RESP:  No IWOB. Good air movement bilaterally. CVS:  RRR. Heart sounds normal.  ABD/GI/GU: Bowel sounds present. Soft. Non tender.  MSK/EXT:  Moves extremities. No apparent deformity. No edema.  SKIN: no apparent skin lesion or wound NEURO: Awake, alert and oriented appropriately.  PERRL.  EOMI. PSYCH: Calm. Normal  affect.  Procedures:  None  Assessment & Plan: Septic shock due to multidrug-resistant E. coli UTI -Completed antibiotic course.  Had a mild temp and leukocytosis that is improving.  Acute blurry vision: improves with covering the right eye but does not resolve completely.  PERRL and EOMI on exam.  CT head and MRI brain without acute finding.  -Would benefit from outpatient ophthalmology evaluation.  DM-2 with hyperglycemia Recent Labs    09/26/19 0741 09/26/19 0939 09/26/19 1131  GLUCAP 162* 192* 193*  -Resume Levemir at 5 units twice daily -Continue sliding scale insulin -Check hemoglobin A1c  History of CAD s/p PCI/stent in 08/2018: No anginal symptoms -Continue BB and Eliquis -Discontinue Plavix-she is more than a year out, and high risk for bleeding together with Eliquis  Anemia of chronic disease: Not sure about melena or hematochezia.  Anemia panel consistent with ACD. Transfused 1 unit with appropriate response. -Monitor H&H  AKI: Initially thought to have CKD-3a.  Resolved. -Continue monitoring  Hyperkalemia: Resolved.  Mild hyponatremia: Due to dehydration? -Encourage p.o. intake  History of DVT -Continue Eliquis   GERD/Gastritis: EGD on 09/04/2019. -Continue PPI and Carafate  Depression: no SI or crisis -Zoloft 25 mg daily per psych. May increase to 50 mg in two to three weeks as needed  Class I obesity: Body mass index is 34.87 kg/m. -Encourage lifestyle change when she recovers from acute illness   Pressure Injury  09/17/19 Sacrum Medial Stage 2 -  Partial thickness loss of dermis presenting as a shallow open injury with a red, pink wound bed without slough. (Active)  09/17/19 2307  Location: Sacrum  Location Orientation: Medial  Staging: Stage 2 -  Partial thickness loss of dermis presenting as a shallow open injury with a red, pink wound bed without slough.  Wound Description (Comments):   Present on Admission: Yes     Nutrition Problem:  Inadequate oral intake Etiology: dysphagia, early satiety, nausea  Signs/Symptoms: per patient/family report  Interventions: Tube feeding   DVT prophylaxis: On Eliquis Code Status: Full code Family Communication: Patient and/or RN. Available if any question.  Discharge barrier: Safe disposition which would be SNF at this time. Patient is from: Home Final disposition: SNF when bed available.  Consultants: Neurology (off), psychiatry (off)   Microbiology summarized: 2/23-COVID-19 negative 2/23-blood cultures negative so far 2/24-urine culture with multidrug-resistant E. coli  Sch Meds:  Scheduled Meds: . apixaban  5 mg Oral BID  . bacitracin-polymyxin b   Right Eye TID  . Chlorhexidine Gluconate Cloth  6 each Topical Daily  . feeding supplement (ENSURE ENLIVE)  237 mL Oral TID BM  . feeding supplement (PRO-STAT SUGAR FREE 64)  30 mL Per Tube BID  . gabapentin  200 mg Oral TID  . insulin aspart  0-15 Units Subcutaneous TID WC  . metoprolol tartrate  12.5 mg Oral BID  . midodrine  10 mg Oral TID WC  . nutrition supplement (JUVEN)  1 packet Per Tube BID BM  . oxybutynin  5 mg Oral QHS  . pantoprazole  40 mg Oral Daily  . rosuvastatin  20 mg Oral q1800  . sertraline  50 mg Oral Daily  . sodium chloride flush  10-40 mL Intracatheter Q12H  . sucralfate  1 g Oral BID   Continuous Infusions: . sodium chloride Stopped (09/24/19 1255)   PRN Meds:.acetaminophen **OR** acetaminophen, albuterol, magic mouthwash w/lidocaine, metoCLOPramide, ondansetron **OR** ondansetron (ZOFRAN) IV, sodium chloride flush, traMADol  Antimicrobials: Anti-infectives (From admission, onward)   Start     Dose/Rate Route Frequency Ordered Stop   09/22/19 1200  fosfomycin (MONUROL) packet 3 g     3 g Oral  Once 09/22/19 1135 09/22/19 1300   09/20/19 2000  Ampicillin-Sulbactam (UNASYN) 3 g in sodium chloride 0.9 % 100 mL IVPB  Status:  Discontinued     3 g 200 mL/hr over 30 Minutes Intravenous Every 12  hours 09/20/19 1422 09/22/19 1135   09/19/19 2030  vancomycin (VANCOREADY) IVPB 1250 mg/250 mL  Status:  Discontinued     1,250 mg 166.7 mL/hr over 90 Minutes Intravenous Every 48 hours 09/17/19 2027 09/20/19 0910   09/18/19 1000  ceFEPIme (MAXIPIME) 2 g in sodium chloride 0.9 % 100 mL IVPB  Status:  Discontinued     2 g 200 mL/hr over 30 Minutes Intravenous Every 12 hours 09/18/19 0937 09/18/19 0950   09/18/19 1000  meropenem (MERREM) 1 g in sodium chloride 0.9 % 100 mL IVPB  Status:  Discontinued     1 g 200 mL/hr over 30 Minutes Intravenous Every 12 hours 09/18/19 0950 09/20/19 1422   09/18/19 0615  metroNIDAZOLE (FLAGYL) IVPB 500 mg  Status:  Discontinued     500 mg 100 mL/hr over 60 Minutes Intravenous Every 8 hours 09/18/19 0609 09/18/19 0611   09/17/19 2030  aztreonam (AZACTAM) 2 g in sodium chloride 0.9 % 100 mL IVPB  Status:  Discontinued  2 g 200 mL/hr over 30 Minutes Intravenous  Once 09/17/19 2021 09/17/19 2027   09/17/19 2030  metroNIDAZOLE (FLAGYL) IVPB 500 mg     500 mg 100 mL/hr over 60 Minutes Intravenous  Once 09/17/19 2021 09/17/19 2140   09/17/19 2030  vancomycin (VANCOCIN) IVPB 1000 mg/200 mL premix  Status:  Discontinued     1,000 mg 200 mL/hr over 60 Minutes Intravenous  Once 09/17/19 2021 09/17/19 2027   09/17/19 2030  vancomycin (VANCOREADY) IVPB 2000 mg/400 mL     2,000 mg 200 mL/hr over 120 Minutes Intravenous  Once 09/17/19 2027 09/17/19 2356   09/17/19 2030  ceFEPIme (MAXIPIME) 2 g in sodium chloride 0.9 % 100 mL IVPB  Status:  Discontinued     2 g 200 mL/hr over 30 Minutes Intravenous Every 12 hours 09/17/19 2027 09/18/19 0611       I have personally reviewed the following labs and images: CBC: Recent Labs  Lab 09/21/19 0520 09/21/19 0520 09/22/19 0425 09/23/19 0453 09/24/19 0438 09/25/19 0454 09/25/19 2033  WBC 19.3*  --  18.6* 19.3* 15.5* 11.8*  --   HGB 8.3*   < > 8.5* 9.0* 8.0* 7.7* 8.8*  HCT 24.8*   < > 25.6* 26.2* 24.8* 23.4* 26.2*   MCV 87.0  --  88.6 86.2 89.2 88.0  --   PLT 230  --  258 309 318 345  --    < > = values in this interval not displayed.   BMP &GFR Recent Labs  Lab 09/22/19 0425 09/23/19 0453 09/24/19 0438 09/25/19 0454 09/26/19 0500  NA 141 143 146* 145 147*  K 3.6 3.2* 3.1* 3.6 4.1  CL 108 109 111 110 113*  CO2 25 26 28 29 29   GLUCOSE 92 67* 84 87 166*  BUN 16 19 21 17 14   CREATININE 1.42* 1.07* 1.01* 1.08* 0.92  CALCIUM 7.0* 7.4* 7.1* 7.0* 7.0*  MG 1.8 2.1 1.6* 1.7 1.7  PHOS 3.8 2.8 2.7 2.2* 2.1*   Estimated Creatinine Clearance: 70.1 mL/min (by C-G formula based on SCr of 0.92 mg/dL). Liver & Pancreas: Recent Labs  Lab 09/20/19 0559 09/21/19 0520  ALBUMIN 1.2* 1.2*   No results for input(s): LIPASE, AMYLASE in the last 168 hours. No results for input(s): AMMONIA in the last 168 hours. Diabetic: No results for input(s): HGBA1C in the last 72 hours. Recent Labs  Lab 09/25/19 2357 09/26/19 0407 09/26/19 0741 09/26/19 0939 09/26/19 1131  GLUCAP 152* 160* 162* 192* 193*   Cardiac Enzymes: No results for input(s): CKTOTAL, CKMB, CKMBINDEX, TROPONINI in the last 168 hours. No results for input(s): PROBNP in the last 8760 hours. Coagulation Profile: No results for input(s): INR, PROTIME in the last 168 hours. Thyroid Function Tests: No results for input(s): TSH, T4TOTAL, FREET4, T3FREE, THYROIDAB in the last 72 hours. Lipid Profile: No results for input(s): CHOL, HDL, LDLCALC, TRIG, CHOLHDL, LDLDIRECT in the last 72 hours. Anemia Panel: Recent Labs    09/25/19 1053  VITAMINB12 733  FOLATE 19.5  FERRITIN 208  TIBC NOT CALCULATED  IRON 61  RETICCTPCT 3.2*   Urine analysis:    Component Value Date/Time   COLORURINE YELLOW 09/18/2019 1155   APPEARANCEUR CLEAR 09/18/2019 1155   LABSPEC 1.016 09/18/2019 1155   LABSPEC 1.015 04/06/2015 0939   PHURINE 5.0 09/18/2019 1155   GLUCOSEU NEGATIVE 09/18/2019 1155   HGBUR NEGATIVE 09/18/2019 1155   BILIRUBINUR NEGATIVE  09/18/2019 1155   BILIRUBINUR 1+ 10/01/2018 1146   [place] 09/18/2019  [place] 09/18/2019 1155   UROBILINOGEN 0.2 10/01/2018 1146   UROBILINOGEN 0.2 04/06/2015 0939   NITRITE NEGATIVE 09/18/2019 1155   LEUKOCYTESUR TRACE (A) 09/18/2019 1155   Sepsis Labs: Invalid input(s): PROCALCITONIN, [place]  Microbiology: Recent Results (from the past 240 hour(s))  Blood culture (routine x 2)     Status: None   Collection Time: 09/17/19  5:10 PM   Specimen: BLOOD  Result Value Ref Range Status   Specimen Description BLOOD LEFT ANTECUBITAL  Final   Special Requests   Final    BOTTLES DRAWN AEROBIC ONLY Blood Culture results may not be optimal due to an inadequate volume of blood received in culture bottles   Culture   Final    NO GROWTH 5 DAYS Performed at [facility], [place] [address], [place], [place] [address]    Report Status 09/22/2019 FINAL  Final  Blood culture (routine x 2)     Status: None   Collection Time: 09/17/19  5:25 PM   Specimen: BLOOD  Result Value Ref Range Status   Specimen Description BLOOD RIGHT ANTECUBITAL  Final   Special Requests   Final    BOTTLES DRAWN AEROBIC AND ANAEROBIC Blood Culture results may not be optimal due to an inadequate volume of blood received in culture bottles   Culture   Final    NO GROWTH 5 DAYS Performed at [facility], [place] [address], [place], [place] [address]    Report Status 09/22/2019 FINAL  Final  SARS CORONAVIRUS 2 (TAT 6-24 HRS) Nasopharyngeal Nasopharyngeal Swab     Status: None   Collection Time: 09/17/19  8:43 PM   Specimen: Nasopharyngeal Swab  Result Value Ref Range Status   SARS Coronavirus 2 NEGATIVE NEGATIVE Final    Comment: (NOTE) SARS-CoV-2 target nucleic acids are NOT DETECTED. The SARS-CoV-2 RNA is generally detectable in upper and lower respiratory specimens during the acute phase of infection. Negative results do not preclude SARS-CoV-2 infection, do not rule  out co-infections with other pathogens, and should not be used as the sole basis for treatment or other patient management decisions. Negative results must be combined with clinical observations, patient history, and epidemiological information. The expected result is Negative. Fact Sheet for Patients: SugarRoll.be Fact Sheet for Healthcare Providers: https://www.woods-mathews.com/ This test is not yet approved or cleared by the [place] FDA and  has been authorized for detection and/or diagnosis of SARS-CoV-2 by FDA under an Emergency Use Authorization (EUA). This EUA will remain  in effect (meaning this test can be used) for the duration of the COVID-19 declaration under Section 56 4(b)(1) of the Act, 21 U.S.C. section 360bbb-3(b)(1), unless the authorization is terminated or revoked sooner. Performed at [facility], [place] [address], [place], [place] [address]   Culture, Urine     Status: Abnormal   Collection Time: 09/18/19 11:55 AM   Specimen: Urine, Clean Catch  Result Value Ref Range Status   Specimen Description URINE, CLEAN CATCH  Final   Special Requests   Final    NONE Performed at [facility], [place] [address] [place], [place] [address]    Culture (A)  Final    >=100,000 COLONIES/mL ESCHERICHIA COLI Confirmed Extended Spectrum Beta-Lactamase Producer (ESBL).  In bloodstream infections from ESBL organisms, carbapenems are preferred over piperacillin/tazobactam. They are shown to have a lower risk of mortality.    Report Status 09/20/2019 FINAL  Final   Organism ID, Bacteria ESCHERICHIA COLI (  A)  Final      Susceptibility   Escherichia coli - MIC*    AMPICILLIN >=32 RESISTANT Resistant     CEFAZOLIN >=64 RESISTANT Resistant     CEFTRIAXONE >=64 RESISTANT Resistant     CIPROFLOXACIN >=4 RESISTANT Resistant     GENTAMICIN <=1 SENSITIVE Sensitive     IMIPENEM <=0.25 SENSITIVE Sensitive     NITROFURANTOIN <=16  SENSITIVE Sensitive     TRIMETH/SULFA >=320 RESISTANT Resistant     AMPICILLIN/SULBACTAM 8 SENSITIVE Sensitive     PIP/TAZO <=4 SENSITIVE Sensitive     * >=100,000 COLONIES/mL ESCHERICHIA COLI    Radiology Studies: MR BRAIN WO CONTRAST  Result Date: 09/25/2019 CLINICAL DATA:  Vision loss, binocular. EXAM: MRI HEAD WITHOUT CONTRAST TECHNIQUE: Multiplanar, multiecho pulse sequences of the brain and surrounding structures were obtained without intravenous contrast. COMPARISON:  Head CT September 24, 2019 FINDINGS: Brain: No acute infarction, hemorrhage, hydrocephalus, extra-axial collection or mass lesion. 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 A few scattered foci of T2 hyperintensity are seen within the white matter of the cerebral hemispheres, nonspecific, most likely related to chronic small vessel disease. Vascular: Normal flow voids. Skull and upper cervical spine: Normal marrow signal. Sinuses/Orbits: Mucosal thickening is noted in sphenoid sinus and ethmoid cells. The orbits are maintained. Other: None. IMPRESSION: 1. No acute intracranial abnormality. 2. 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 3. Mild chronic small vessel disease. Electronically Signed   By: Pedro Earls M.D.   On: 09/25/2019 14:33     Elena Cothern T. [place]  If 7PM-7AM, please contact night-coverage [email or web address] Password [email or web address] 09/26/2019, 1:10 PM

## 2019-09-26 NOTE — Progress Notes (Signed)
Nutrition Follow-up  DOCUMENTATION CODES:   Obesity unspecified  INTERVENTION:   Ensure Enlive po TID, each supplement provides 350 kcal and 20 grams of protein (Strawberry or Vanilla)    Encourage PO intake  NUTRITION DIAGNOSIS:   Inadequate oral intake related to dysphagia, early satiety, nausea as evidenced by per patient/family report. Ongoing.   GOAL:   Patient will meet greater than or equal to 90% of their needs Progressing.   MONITOR:   TF tolerance, PO intake  REASON FOR ASSESSMENT:   Consult Enteral/tube feeding initiation and management, Assessment of nutrition requirement/status  ASSESSMENT:   Pt with PMH of DM, HTN, OSA, dysmotility s/p partial gastrectomy for gastric cancer 10 years ago, pt has had multiple hospitalizations for hypotension and dehydration secondary to diarrhea and poor po intake since having COVID-19 in 06/2019.   Per RN pt is eating much better today and is enjoying her Regular diet. She will only drink the supplements sometimes and thinks they taste a little too sweet. She has refused Juven so will d/c for now.   2/26 Cortrak placed in stomach, pending further advancement per IR 2/28 Cortrak removed per MD eating better now on Regular diet  Medications reviewed and include: SSI, 5 units levemir BID, carafate  Labs reviewed: PO4: 2.1 (L) CBG's: [phone]   Diet Order:   Diet Order            Diet regular Room service appropriate? Yes; Fluid consistency: Thin  Diet effective now              EDUCATION NEEDS:   No education needs have been identified at this time  Skin:  Skin Assessment: Skin Integrity Issues: Skin Integrity Issues:: Stage II Stage II: sacrum  Last BM:  3/4  Height:   Ht Readings from Last 1 Encounters:  09/17/19 5' 6"  (1.676 m)    Weight:   Wt Readings from Last 1 Encounters:  09/26/19 98 kg    Ideal Body Weight:  59 kg  BMI:  Body mass index is 34.87 kg/m.  Estimated Nutritional  Needs:   Kcal:  1800-2000  Protein:  95-115 grams  Fluid:  >1.8 L/day  Lockie Pares., RD, LDN, CNSC See AMiON for contact information

## 2019-09-27 DIAGNOSIS — R2241 Localized swelling, mass and lump, right lower limb: Secondary | ICD-10-CM | POA: Diagnosis not present

## 2019-09-27 DIAGNOSIS — N179 Acute kidney failure, unspecified: Secondary | ICD-10-CM | POA: Diagnosis not present

## 2019-09-27 DIAGNOSIS — R918 Other nonspecific abnormal finding of lung field: Secondary | ICD-10-CM | POA: Diagnosis not present

## 2019-09-27 DIAGNOSIS — M79605 Pain in left leg: Secondary | ICD-10-CM | POA: Diagnosis not present

## 2019-09-27 DIAGNOSIS — E669 Obesity, unspecified: Secondary | ICD-10-CM | POA: Diagnosis not present

## 2019-09-27 DIAGNOSIS — B9620 Unspecified Escherichia coli [E. coli] as the cause of diseases classified elsewhere: Secondary | ICD-10-CM | POA: Diagnosis not present

## 2019-09-27 DIAGNOSIS — Z955 Presence of coronary angioplasty implant and graft: Secondary | ICD-10-CM | POA: Diagnosis not present

## 2019-09-27 DIAGNOSIS — I10 Essential (primary) hypertension: Secondary | ICD-10-CM | POA: Diagnosis not present

## 2019-09-27 DIAGNOSIS — Z7901 Long term (current) use of anticoagulants: Secondary | ICD-10-CM | POA: Diagnosis not present

## 2019-09-27 DIAGNOSIS — R1084 Generalized abdominal pain: Secondary | ICD-10-CM | POA: Diagnosis not present

## 2019-09-27 DIAGNOSIS — Z6836 Body mass index (BMI) 36.0-36.9, adult: Secondary | ICD-10-CM | POA: Diagnosis not present

## 2019-09-27 DIAGNOSIS — M79604 Pain in right leg: Secondary | ICD-10-CM | POA: Diagnosis not present

## 2019-09-27 DIAGNOSIS — M6281 Muscle weakness (generalized): Secondary | ICD-10-CM | POA: Diagnosis not present

## 2019-09-27 DIAGNOSIS — E860 Dehydration: Secondary | ICD-10-CM | POA: Diagnosis not present

## 2019-09-27 DIAGNOSIS — R531 Weakness: Secondary | ICD-10-CM | POA: Diagnosis not present

## 2019-09-27 DIAGNOSIS — E861 Hypovolemia: Secondary | ICD-10-CM | POA: Diagnosis not present

## 2019-09-27 DIAGNOSIS — J9601 Acute respiratory failure with hypoxia: Secondary | ICD-10-CM | POA: Diagnosis not present

## 2019-09-27 DIAGNOSIS — K529 Noninfective gastroenteritis and colitis, unspecified: Secondary | ICD-10-CM | POA: Diagnosis present

## 2019-09-27 DIAGNOSIS — R031 Nonspecific low blood-pressure reading: Secondary | ICD-10-CM | POA: Diagnosis not present

## 2019-09-27 DIAGNOSIS — E440 Moderate protein-calorie malnutrition: Secondary | ICD-10-CM | POA: Diagnosis not present

## 2019-09-27 DIAGNOSIS — N183 Chronic kidney disease, stage 3 unspecified: Secondary | ICD-10-CM | POA: Diagnosis not present

## 2019-09-27 DIAGNOSIS — M25552 Pain in left hip: Secondary | ICD-10-CM | POA: Diagnosis not present

## 2019-09-27 DIAGNOSIS — R110 Nausea: Secondary | ICD-10-CM | POA: Diagnosis not present

## 2019-09-27 DIAGNOSIS — G894 Chronic pain syndrome: Secondary | ICD-10-CM | POA: Diagnosis not present

## 2019-09-27 DIAGNOSIS — B9629 Other Escherichia coli [E. coli] as the cause of diseases classified elsewhere: Secondary | ICD-10-CM | POA: Diagnosis not present

## 2019-09-27 DIAGNOSIS — Z885 Allergy status to narcotic agent status: Secondary | ICD-10-CM | POA: Diagnosis not present

## 2019-09-27 DIAGNOSIS — N1831 Chronic kidney disease, stage 3a: Secondary | ICD-10-CM | POA: Diagnosis not present

## 2019-09-27 DIAGNOSIS — R262 Difficulty in walking, not elsewhere classified: Secondary | ICD-10-CM | POA: Diagnosis not present

## 2019-09-27 DIAGNOSIS — D509 Iron deficiency anemia, unspecified: Secondary | ICD-10-CM | POA: Diagnosis not present

## 2019-09-27 DIAGNOSIS — N1832 Chronic kidney disease, stage 3b: Secondary | ICD-10-CM | POA: Diagnosis present

## 2019-09-27 DIAGNOSIS — F339 Major depressive disorder, recurrent, unspecified: Secondary | ICD-10-CM | POA: Diagnosis not present

## 2019-09-27 DIAGNOSIS — E119 Type 2 diabetes mellitus without complications: Secondary | ICD-10-CM | POA: Diagnosis not present

## 2019-09-27 DIAGNOSIS — R5383 Other fatigue: Secondary | ICD-10-CM | POA: Diagnosis not present

## 2019-09-27 DIAGNOSIS — E1165 Type 2 diabetes mellitus with hyperglycemia: Secondary | ICD-10-CM | POA: Diagnosis not present

## 2019-09-27 DIAGNOSIS — E1122 Type 2 diabetes mellitus with diabetic chronic kidney disease: Secondary | ICD-10-CM | POA: Diagnosis present

## 2019-09-27 DIAGNOSIS — I2510 Atherosclerotic heart disease of native coronary artery without angina pectoris: Secondary | ICD-10-CM

## 2019-09-27 DIAGNOSIS — M79661 Pain in right lower leg: Secondary | ICD-10-CM | POA: Diagnosis not present

## 2019-09-27 DIAGNOSIS — H538 Other visual disturbances: Secondary | ICD-10-CM

## 2019-09-27 DIAGNOSIS — Z03818 Encounter for observation for suspected exposure to other biological agents ruled out: Secondary | ICD-10-CM | POA: Diagnosis not present

## 2019-09-27 DIAGNOSIS — R0902 Hypoxemia: Secondary | ICD-10-CM | POA: Diagnosis not present

## 2019-09-27 DIAGNOSIS — R5381 Other malaise: Secondary | ICD-10-CM

## 2019-09-27 DIAGNOSIS — M2550 Pain in unspecified joint: Secondary | ICD-10-CM | POA: Diagnosis not present

## 2019-09-27 DIAGNOSIS — I959 Hypotension, unspecified: Secondary | ICD-10-CM | POA: Diagnosis not present

## 2019-09-27 DIAGNOSIS — E875 Hyperkalemia: Secondary | ICD-10-CM | POA: Diagnosis present

## 2019-09-27 DIAGNOSIS — N189 Chronic kidney disease, unspecified: Secondary | ICD-10-CM

## 2019-09-27 DIAGNOSIS — I129 Hypertensive chronic kidney disease with stage 1 through stage 4 chronic kidney disease, or unspecified chronic kidney disease: Secondary | ICD-10-CM | POA: Diagnosis present

## 2019-09-27 DIAGNOSIS — Z1612 Extended spectrum beta lactamase (ESBL) resistance: Secondary | ICD-10-CM | POA: Diagnosis not present

## 2019-09-27 DIAGNOSIS — L89152 Pressure ulcer of sacral region, stage 2: Secondary | ICD-10-CM | POA: Diagnosis not present

## 2019-09-27 DIAGNOSIS — R000 Tachycardia, unspecified: Secondary | ICD-10-CM | POA: Diagnosis not present

## 2019-09-27 DIAGNOSIS — B948 Sequelae of other specified infectious and parasitic diseases: Secondary | ICD-10-CM | POA: Diagnosis not present

## 2019-09-27 DIAGNOSIS — Z86718 Personal history of other venous thrombosis and embolism: Secondary | ICD-10-CM | POA: Diagnosis not present

## 2019-09-27 DIAGNOSIS — E1142 Type 2 diabetes mellitus with diabetic polyneuropathy: Secondary | ICD-10-CM | POA: Diagnosis not present

## 2019-09-27 DIAGNOSIS — R627 Adult failure to thrive: Secondary | ICD-10-CM | POA: Diagnosis not present

## 2019-09-27 DIAGNOSIS — K760 Fatty (change of) liver, not elsewhere classified: Secondary | ICD-10-CM | POA: Diagnosis not present

## 2019-09-27 DIAGNOSIS — G8929 Other chronic pain: Secondary | ICD-10-CM | POA: Diagnosis not present

## 2019-09-27 DIAGNOSIS — R638 Other symptoms and signs concerning food and fluid intake: Secondary | ICD-10-CM | POA: Diagnosis not present

## 2019-09-27 DIAGNOSIS — L309 Dermatitis, unspecified: Secondary | ICD-10-CM | POA: Diagnosis not present

## 2019-09-27 DIAGNOSIS — D638 Anemia in other chronic diseases classified elsewhere: Secondary | ICD-10-CM

## 2019-09-27 DIAGNOSIS — E785 Hyperlipidemia, unspecified: Secondary | ICD-10-CM | POA: Diagnosis present

## 2019-09-27 DIAGNOSIS — Z7401 Bed confinement status: Secondary | ICD-10-CM | POA: Diagnosis not present

## 2019-09-27 DIAGNOSIS — K219 Gastro-esophageal reflux disease without esophagitis: Secondary | ICD-10-CM | POA: Diagnosis not present

## 2019-09-27 DIAGNOSIS — R4182 Altered mental status, unspecified: Secondary | ICD-10-CM | POA: Diagnosis not present

## 2019-09-27 DIAGNOSIS — N390 Urinary tract infection, site not specified: Secondary | ICD-10-CM | POA: Diagnosis not present

## 2019-09-27 DIAGNOSIS — Z8616 Personal history of COVID-19: Secondary | ICD-10-CM | POA: Diagnosis not present

## 2019-09-27 DIAGNOSIS — Z6835 Body mass index (BMI) 35.0-35.9, adult: Secondary | ICD-10-CM | POA: Diagnosis not present

## 2019-09-27 DIAGNOSIS — R600 Localized edema: Secondary | ICD-10-CM | POA: Diagnosis not present

## 2019-09-27 DIAGNOSIS — I9589 Other hypotension: Secondary | ICD-10-CM | POA: Diagnosis not present

## 2019-09-27 DIAGNOSIS — R52 Pain, unspecified: Secondary | ICD-10-CM | POA: Diagnosis not present

## 2019-09-27 DIAGNOSIS — E1121 Type 2 diabetes mellitus with diabetic nephropathy: Secondary | ICD-10-CM | POA: Diagnosis not present

## 2019-09-27 DIAGNOSIS — G9341 Metabolic encephalopathy: Secondary | ICD-10-CM | POA: Diagnosis not present

## 2019-09-27 DIAGNOSIS — E1169 Type 2 diabetes mellitus with other specified complication: Secondary | ICD-10-CM | POA: Diagnosis present

## 2019-09-27 LAB — MAGNESIUM: Magnesium: 1.7 mg/dL (ref 1.7–2.4)

## 2019-09-27 LAB — RENAL FUNCTION PANEL
Albumin: 1.1 g/dL — ABNORMAL LOW (ref 3.5–5.0)
Anion gap: 7 (ref 5–15)
BUN: 11 mg/dL (ref 8–23)
CO2: 29 mmol/L (ref 22–32)
Calcium: 7 mg/dL — ABNORMAL LOW (ref 8.9–10.3)
Chloride: 109 mmol/L (ref 98–111)
Creatinine, Ser: 1.17 mg/dL — ABNORMAL HIGH (ref 0.44–1.00)
GFR calc Af Amer: 56 mL/min — ABNORMAL LOW (ref >60)
GFR calc non Af Amer: 48 mL/min — ABNORMAL LOW (ref >60)
Glucose, Bld: 152 mg/dL — ABNORMAL HIGH (ref 70–99)
Phosphorus: 2.2 mg/dL — ABNORMAL LOW (ref 2.5–4.6)
Potassium: 4.8 mmol/L (ref 3.5–5.1)
Sodium: 145 mmol/L (ref 135–145)

## 2019-09-27 LAB — GLUCOSE, CAPILLARY
Glucose-Capillary: 59 mg/dL — ABNORMAL LOW (ref 70–99)
Glucose-Capillary: 86 mg/dL (ref 70–99)
Glucose-Capillary: 105 mg/dL — ABNORMAL HIGH (ref 70–99)
Glucose-Capillary: 58 mg/dL — ABNORMAL LOW (ref 70–99)
Glucose-Capillary: 181 mg/dL — ABNORMAL HIGH (ref 70–99)

## 2019-09-27 LAB — HEMOGLOBIN A1C
Hgb A1c MFr Bld: 5.6 % (ref 4.8–5.6)
Mean Plasma Glucose: 114.02 mg/dL

## 2019-09-27 LAB — CBC
HCT: 27.4 % — ABNORMAL LOW (ref 36.0–46.0)
Hemoglobin: 9 g/dL — ABNORMAL LOW (ref 12.0–15.0)
MCH: 29.7 pg (ref 26.0–34.0)
MCHC: 32.8 g/dL (ref 30.0–36.0)
MCV: 90.4 fL (ref 80.0–100.0)
Platelets: 346 10*3/uL (ref 150–400)
RBC: 3.03 MIL/uL — ABNORMAL LOW (ref 3.87–5.11)
RDW: 16.7 % — ABNORMAL HIGH (ref 11.5–15.5)
WBC: 9.8 10*3/uL (ref 4.0–10.5)
nRBC: 0.3 % — ABNORMAL HIGH (ref 0.0–0.2)

## 2019-09-27 MED ORDER — ONDANSETRON HCL 4 MG PO TABS: 4.0000 mg | ORAL_TABLET | Freq: Four times a day (QID) | ORAL | 0 refills | Status: AC | PRN

## 2019-09-27 MED ORDER — HEPARIN SOD (PORK) LOCK FLUSH 100 UNIT/ML IV SOLN
500.0000 [IU] | INTRAVENOUS | Status: AC | PRN
  Administered 2019-09-27: 500 [IU]
  Filled 2019-09-27: qty 5

## 2019-09-27 MED ORDER — MIDODRINE HCL 10 MG PO TABS: 10.0000 mg | ORAL_TABLET | Freq: Three times a day (TID) | ORAL | Status: AC

## 2019-09-27 MED ORDER — ACETAMINOPHEN 500 MG PO TABS: 1000.0000 mg | ORAL_TABLET | Freq: Three times a day (TID) | ORAL | 1 refills | Status: AC

## 2019-09-27 MED ORDER — GABAPENTIN 100 MG PO CAPS: ORAL_CAPSULE | ORAL | Status: DC

## 2019-09-27 NOTE — Progress Notes (Addendum)
Physical Therapy Treatment Patient Details Name: [name] MRN: [id number] DOB: 08/27/1950 Today's Date: 09/27/2019    History of Present Illness 69 y.o. female with past medical history significant for DM2, HLD, Asthma who presented to the ED on 2/23 with shock secondary to hypovolemia, adrenal insufficiency, renal insufficiency, and Ecoli UTI. Pt with recent stay at SNF, d/c home for 1 day and readmitted. Pt with recent [facility] and [facility] admission for covid 06/2019.    PT Comments    Pt performed functional mobility this session with emphasis on transfer training.  R hip remains weak.  Pt required assistance to boost into standing, weight shift forward and elevate upper trunk.  She appears flexed in standing.  Continue to recommend SNF placement at this time.  Plan is for d/c today.      Follow Up Recommendations  SNF;Supervision/Assistance - 24 hour     Equipment Recommendations  None recommended by PT    Recommendations for Other Services       Precautions / Restrictions Precautions Precautions: Fall Precaution Comments: Pt reports she was falling every day Restrictions Weight Bearing Restrictions: No    Mobility  Bed Mobility Overal bed mobility: Needs Assistance Bed Mobility: Supine to Sit;Sit to Supine Rolling: Min guard   Supine to sit: Mod assist     General bed mobility comments: Pt required assistance to move to edge of bed and elevate trunk into a seated position. Heavy use of bed rail to mobilize.  Pt remains with difficulty to move R LE.  Transfers Overall transfer level: Needs assistance Equipment used: Ambulation equipment used(sara stedy) Transfers: Sit to/from Stand Sit to Stand: Max assist;From elevated surface         General transfer comment: Utilized bed sheet behind patient to weight shift forward and pull into standing.  Pt with flexed posture forward and required cues for hip extension and upper trunk control.  Ambulation/Gait Ambulation/Gait  assistance: (NT too weak to progress steps forward.)               Stairs             Wheelchair Mobility    Modified Rankin (Stroke Patients Only)       Balance Overall balance assessment: Needs assistance;History of Falls Sitting-balance support: Bilateral upper extremity supported;Feet supported Sitting balance-Leahy Scale: Fair       Standing balance-Leahy Scale: Poor Standing balance comment: Heavy reliance on UE support and external assistance.                            Cognition Arousal/Alertness: Awake/alert Behavior During Therapy: Flat affect Overall Cognitive Status: Within Functional Limits for tasks assessed                                 General Comments: Pt sad and stating she wants to go home. Moments of quiet crying.      Exercises      General Comments        Pertinent Vitals/Pain Pain Assessment: No/denies pain Faces Pain Scale: Hurts even more Pain Location: R hip Pain Descriptors / Indicators: Discomfort;Sore Pain Intervention(s): Monitored during session    Home Living                      Prior Function            PT Goals (  current goals can now be found in the care plan section) Acute Rehab PT Goals Patient Stated Goal: "I don't want to go back to one of those nasty rehab centers." PT Goal Formulation: With patient Potential to Achieve Goals: Good Progress towards PT goals: Progressing toward goals    Frequency    Min 2X/week      PT Plan Current plan remains appropriate    Co-evaluation              AM-PAC PT "6 Clicks" Mobility   Outcome Measure  Help needed turning from your back to your side while in a flat bed without using bedrails?: A Lot Help needed moving from lying on your back to sitting on the side of a flat bed without using bedrails?: A Lot Help needed moving to and from a bed to a chair (including a wheelchair)?: Total Help needed standing up from a  chair using your arms (e.g., wheelchair or bedside chair)?: Total Help needed to walk in hospital room?: Total Help needed climbing 3-5 steps with a railing? : Total 6 Click Score: 8    End of Session Equipment Utilized During Treatment: Gait belt Activity Tolerance: Patient tolerated treatment well Patient left: with call bell/phone within reach;in chair;with chair alarm set Nurse Communication: Mobility status;Other (comment) PT Visit Diagnosis: Other abnormalities of gait and mobility (R26.89);Muscle weakness (generalized) (M62.81)     Time: 4503-8882 PT Time Calculation (min) (ACUTE ONLY): 36 min  Charges:  $Therapeutic Activity: 23-37 mins                     Erasmo Leventhal , PTA Acute Rehabilitation Services Pager [phone] Office [phone]     Nijah Orlich Eli Hose 09/27/2019, 12:40 PM

## 2019-09-27 NOTE — Progress Notes (Signed)
Hypoglycemic Event  CBG: 58  Treatment: 8 oz juice/soda  Symptoms: Shaky  Follow-up CBG: Time: CBG Result:  Possible Reasons for Event: Unknown  Comments/MD notified:    Lennox Laity

## 2019-09-27 NOTE — Discharge Summary (Signed)
Physician Discharge Summary  [name] VEH:[id number] DOB: [birth year] DOA: 09/17/2019  PCP: Ann Held, DO  Admit date: 09/17/2019 Discharge date: 09/27/2019  Admitted From: Home Disposition: SNF  Recommendations for Outpatient Follow-up:  1. Follow ups as below. 2. Please obtain CBC/BMP/Mag in 1 week 3. Please follow up on the following pending results: None  Discharge Condition: Stable CODE STATUS: Full code  Contact information for after-discharge care    Destination    [facility] Preferred SNF .   Service: Skilled Nursing Contact information: [address] [place] [phone]              Hospital Course: 69 year old female with history of DM-2, CAD s/p PCI, recent diagnosis of DVT and COVID-19 about 2 months ago presenting with generalized weakness.  In ED, she was hypotensive with lactic acidosis and AKI.  Admitted to ICU with septic shock from UTI requiring pressors.  Transferred to [facility] care on 09/21/2019.  She completed antibiotic course for multidrug-resistant E. coli UTI and possible aspiration pneumonia.  Patient had interval development of bilateral vision change, right greater than left on 3/2.  CT head and MRI brain without significant finding.  Vision change improving.  Recommend outpatient follow-up with ophthalmology.  Patient met with palliative care during this hospitalization.  She remained full code with full scope of care.  Patient was evaluated by PT/OT who recommended SNF.  See individual problem list below for more on hospital course.  Discharge Diagnoses:  Septic shock due to multidrug-resistant E. coli UTI-resolved. -Completed antibiotic course.   Acute blurry vision:  PERRL and EOMI on exam.  CT head and MRI brain without acute finding. Improves with covering the right eye but does not resolve completely.  Overall, vision improved but needs outpatient follow-up with  ophthalmology -Please refer to ophthalmology outpatient.  DM-2 with hyperglycemia: A1c 5.6%. -Continue low-dose Metformin and statin.  History of CAD s/p PCI/stent in 08/2018: No anginal symptoms -Continue BB and Eliquis -Discontinued Plavix-she is more than a year out, and high risk for bleeding together with Eliquis  Anemia of chronic disease:  Baseline Hgb 8-9 > 10.3 (admit) >>> 7.7> 1 unit > 8.8 > 9.0.  Not sure about melena or hematochezia.  Anemia panel consistent with ACD. Transfused 1 unit with appropriate response. -Recheck CBC in 1 week  AKI on CKD-3a: Baseline Cr ranges from 1.0-1.5> 1.7 (admit)> 0.92> 1.17 -Recheck renal function in 1 week -Avoid nephrotoxic meds.  Hyperkalemia/hyponatremia: Resolved. -Recheck BMP in 1 week.  History of DVT -Continue Eliquis   GERD/Gastritis: EGD on 09/04/2019. -Continue PPI and Carafate  Depression: no SI or crisis -Discharged on home Zoloft 50 mg daily  Class I obesity: Body mass index is 34.87 kg/m. -Encourage lifestyle change when she recovers from acute illness  Stage II sacral decubitus: POA Pressure Injury 09/17/19 Sacrum Medial Stage 2 -  Partial thickness loss of dermis presenting as a shallow open injury with a red, pink wound bed without slough. (Active)  09/17/19 2307  Location: Sacrum  Location Orientation: Medial  Staging: Stage 2 -  Partial thickness loss of dermis presenting as a shallow open injury with a red, pink wound bed without slough.  Wound Description (Comments):   Present on Admission: Yes  -Daily dressing change and monitoring -Optimize nutrition  Nutrition Problem: Inadequate oral intake Etiology: dysphagia, early satiety, nausea  Signs/Symptoms: per patient/family report  Interventions: Tube feeding  Discharge Instructions  Discharge Instructions  Diet - low sodium heart healthy   Complete by: As directed    Diet Carb Modified   Complete by: As directed    Increase activity  slowly   Complete by: As directed      Allergies as of 09/27/2019      Reactions   Cephalexin Itching, Rash, Other (See Comments)   Sensation of throat closing. Tolerated pip/tazo, cefepime, ceftriaxone before    Adhesive [tape] Other (See Comments)   Burn Skin   Codeine Other (See Comments)   paranoid   Zocor [simvastatin - High Dose] Other (See Comments)   Muscle spasms      Medication List    STOP taking these medications   omeprazole 20 MG capsule Commonly known as: PRILOSEC     TAKE these medications   acetaminophen 500 MG tablet Commonly known as: TYLENOL Take 2 tablets (1,000 mg total) by mouth every 8 (eight) hours.   albuterol (2.5 MG/3ML) 0.083% nebulizer solution Commonly known as: PROVENTIL Take 3 mLs (2.5 mg total) by nebulization every 6 (six) hours as needed for wheezing or shortness of breath.   albuterol 108 (90 Base) MCG/ACT inhaler Commonly known as: VENTOLIN HFA Inhale 2 puffs into the lungs every 6 (six) hours as needed for wheezing or shortness of breath.   apixaban 5 MG Tabs tablet Commonly known as: ELIQUIS Take 1 tablet (5 mg total) by mouth 2 (two) times daily.   Centrum Silver Adult 50+ Tabs Take 1 tablet once a day What changed:   how much to take  how to take this  when to take this   feeding supplement (GLUCERNA SHAKE) Liqd Take 237 mLs by mouth 3 (three) times daily between meals.   gabapentin 100 MG capsule Commonly known as: NEURONTIN Take 6 capsules (600 mg total) by mouth at bedtime AND 3 capsules (300 mg total) every morning. What changed:   medication strength  See the new instructions.   glucose blood test strip Commonly known as: OneTouch Verio Use as instructed to check blood sugar 3 times a day   lidocaine-prilocaine cream Commonly known as: EMLA Apply 1 application topically as needed. What changed: reasons to take this   metFORMIN 500 MG tablet Commonly known as: GLUCOPHAGE Take 1 tablet (500 mg total)  by mouth 2 (two) times daily with a meal.   metoCLOPramide 5 MG tablet Commonly known as: REGLAN Take 1 tablet (5 mg total) by mouth every 8 (eight) hours as needed for nausea (before meals as needed for nausea.).   metoprolol tartrate 25 MG tablet Commonly known as: LOPRESSOR Take 0.5 tablets (12.5 mg total) by mouth 2 (two) times daily.   midodrine 10 MG tablet Commonly known as: PROAMATINE Take 1 tablet (10 mg total) by mouth 3 (three) times daily with meals.   nitroGLYCERIN 0.4 MG SL tablet Commonly known as: Nitrostat Place 1 tablet (0.4 mg total) under the tongue every 5 (five) minutes as needed. What changed: reasons to take this   nystatin 100000 UNIT/ML suspension Commonly known as: MYCOSTATIN Take 5 mLs by mouth 4 (four) times daily. For thrush x 10 days started 09-16-19   nystatin powder Commonly known as: MYCOSTATIN/NYSTOP Apply topically 3 (three) times daily.   ondansetron 4 MG tablet Commonly known as: ZOFRAN Take 1 tablet (4 mg total) by mouth every 6 (six) hours as needed for nausea.   oxybutynin 5 MG 24 hr tablet Commonly known as: DITROPAN-XL Take 5 mg by mouth daily.   pantoprazole 40  MG tablet Commonly known as: PROTONIX Take 1 tablet (40 mg total) by mouth daily.   rosuvastatin 20 MG tablet Commonly known as: CRESTOR Take 1 tablet (20 mg total) by mouth daily at 6 PM.   sertraline 50 MG tablet Commonly known as: ZOLOFT Take 1 tablet (50 mg total) by mouth daily.   traZODone 50 MG tablet Commonly known as: DESYREL Take 0.5 tablets (25 mg total) by mouth at bedtime as needed for sleep.       Consultations:  Critical care  Palliative care  Procedures/Studies:  DG Abd 1 View  Result Date: 09/20/2019 CLINICAL DATA:  Feeding tube placement EXAM: ABDOMEN - 1 VIEW COMPARISON:  Abdominal film 08/10/2019 FINDINGS: Feeding tube in the mid to distal stomach. Limited assessment of the abdomen with scattered loops of gas-filled bowel,  potentially both large and small bowel, no overt signs of obstruction with limited assessment, exclusion right flank from today's examination. No acute bone finding. Potential small left effusion. IMPRESSION: 1. Feeding tube in the mid to distal stomach. 2. Potential small left effusion. Electronically Signed   By: Zetta Bills M.D.   On: 09/20/2019 10:14   CT HEAD WO CONTRAST  Result Date: 09/24/2019 CLINICAL DATA:  Subacute neuro deficit EXAM: CT HEAD WITHOUT CONTRAST TECHNIQUE: Contiguous axial images were obtained from the base of the skull through the vertex without intravenous contrast. COMPARISON:  CT head 09/17/2019 FINDINGS: Brain: No evidence of acute infarction, hemorrhage, hydrocephalus, extra-axial collection or mass lesion/mass effect. Vascular: Negative for hyperdense vessel Skull: Negative Sinuses/Orbits: Negative Other: None IMPRESSION: Negative CT head Electronically Signed   By: Franchot Gallo M.D.   On: 09/24/2019 13:33   CT HEAD WO CONTRAST  Result Date: 09/18/2019 CLINICAL DATA:  Ataxia, gastric cancer, sleep apnea EXAM: CT HEAD WITHOUT CONTRAST TECHNIQUE: Contiguous axial images were obtained from the base of the skull through the vertex without intravenous contrast. COMPARISON:  08/10/2019 FINDINGS: Brain: No acute infarct or hemorrhage. Lateral ventricles and midline structures are stable. No acute extra-axial fluid collections. No mass effect. Vascular: No hyperdense vessel or unexpected calcification. Skull: Normal. Negative for fracture or focal lesion. Sinuses/Orbits: No acute finding. Other: None IMPRESSION: 1. Stable head CT, no acute intracranial process. Electronically Signed   By: Randa Ngo M.D.   On: 09/18/2019 00:52   MR BRAIN WO CONTRAST  Result Date: 09/25/2019 CLINICAL DATA:  Vision loss, binocular. EXAM: MRI HEAD WITHOUT CONTRAST TECHNIQUE: Multiplanar, multiecho pulse sequences of the brain and surrounding structures were obtained without intravenous  contrast. COMPARISON:  Head CT September 24, 2019 FINDINGS: Brain: No acute infarction, hemorrhage, hydrocephalus, extra-axial collection or mass lesion. 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 A few scattered foci of T2 hyperintensity are seen within the white matter of the cerebral hemispheres, nonspecific, most likely related to chronic small vessel disease. Vascular: Normal flow voids. Skull and upper cervical spine: Normal marrow signal. Sinuses/Orbits: Mucosal thickening is noted in sphenoid sinus and ethmoid cells. The orbits are maintained. Other: None. IMPRESSION: 1. No acute intracranial abnormality. 2. 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 3. Mild chronic small vessel disease. Electronically Signed   By: Pedro Earls M.D.   On: 09/25/2019 14:33   DG Chest Portable 1 View  Result Date: 09/17/2019 CLINICAL DATA:  69 year old female with hypotension EXAM: PORTABLE CHEST 1 VIEW COMPARISON:  Chest radiograph dated 09/02/2019. FINDINGS: Right-sided PICC with tip over central SVC in similar position. New small left pleural effusion and  left lung base atelectasis. Infiltrate is not excluded. Clinical correlation is recommended. The right lung remains clear. No pneumothorax. Stable cardiomediastinal silhouette. No acute osseous pathology. IMPRESSION: Small left pleural effusion and left lung base atelectasis or infiltrate. Clinical correlation is recommended. No other interval change. Electronically Signed   By: Anner Crete M.D.   On: 09/17/2019 17:44   DG Chest Port 1 View  Result Date: 09/02/2019 CLINICAL DATA:  Chest pain. EXAM: PORTABLE CHEST 1 VIEW COMPARISON:  Chest x-ray dated August 16, 2019. FINDINGS: Unchanged right chest wall port catheter. The heart size and mediastinal contours are within normal limits. Normal pulmonary vascularity. No focal consolidation, pleural effusion, or pneumothorax. No acute osseous abnormality.  IMPRESSION: No active disease. Electronically Signed   By: Titus Dubin M.D.   On: 09/02/2019 12:28   ECHOCARDIOGRAM COMPLETE  Result Date: 09/03/2019    ECHOCARDIOGRAM REPORT   Patient Name:   CACHET MCCUTCHEN Shadden Date of Exam: 09/03/2019 Medical Rec #:  [id number]    Height:       66.0 in Accession #:    [id number]   Weight:       196.9 lb Date of Birth:  Sep 19, 1950    BSA:          1.99 m Patient Age:    77 years     BP:           89/56 mmHg Patient Gender: F            HR:           76 bpm. Exam Location:  Inpatient Procedure: 2D Echo, Color Doppler, Cardiac Doppler and Intracardiac            Opacification Agent Indications:    R07.9* Chest pain, unspecified  History:        Patient has prior history of Echocardiogram examinations, most                 recent 08/28/2018. Risk Factors:Hypertension, Diabetes,                 Dyslipidemia and Sleep Apnea.  Sonographer:    Raquel Sarna Senior RDCS Referring Phys: [id number] [place]  1. Left ventricular ejection fraction, by estimation, is 65 to 70%. The left ventricle has normal function. The left ventrical has no regional wall motion abnormalities. Left ventricular diastolic parameters are consistent with Grade I diastolic dysfunction (impaired relaxation).  2. Right ventricular systolic function is normal. The right ventricular size is normal. There is mildly elevated pulmonary artery systolic pressure.  3. Mild mitral valve regurgitation.  4. The aortic valve is normal in structure and function. Aortic valve regurgitation is trivial . No aortic stenosis is present.  5. The inferior vena cava is normal in size with greater than 50% respiratory variability, suggesting right atrial pressure of 3 mmHg. FINDINGS  Left Ventricle: Left ventricular ejection fraction, by estimation, is 65 to 70%. The left ventricle has normal function. The left ventricle has no regional wall motion abnormalities. The left ventricular internal cavity size was normal in size. There is   no left ventricular hypertrophy. Left ventricular diastolic parameters are consistent with Grade I diastolic dysfunction (impaired relaxation). Right Ventricle: The right ventricular size is normal. No increase in right ventricular wall thickness. Right ventricular systolic function is normal. There is mildly elevated pulmonary artery systolic pressure. The tricuspid regurgitant velocity is 2.58  m/s, and with an assumed right atrial pressure of 8 mmHg, the estimated right  ventricular systolic pressure is 51.7 mmHg. Left Atrium: Left atrial size was normal in size. Right Atrium: Right atrial size was normal in size. Pericardium: There is no evidence of pericardial effusion. Mitral Valve: The mitral valve is normal in structure and function. Normal mobility of the mitral valve leaflets. Mild mitral valve regurgitation. No evidence of mitral valve stenosis. Tricuspid Valve: The tricuspid valve is normal in structure. Tricuspid valve regurgitation is mild . No evidence of tricuspid stenosis. Aortic Valve: The aortic valve is normal in structure and function. Aortic valve regurgitation is trivial. No aortic stenosis is present. Pulmonic Valve: The pulmonic valve was normal in structure. Pulmonic valve regurgitation is not visualized. No evidence of pulmonic stenosis. Aorta: The aortic root is normal in size and structure. Venous: The inferior vena cava is normal in size with greater than 50% respiratory variability, suggesting right atrial pressure of 3 mmHg. The inferior vena cava and the hepatic vein show a normal flow pattern. IAS/Shunts: No atrial level shunt detected by color flow Doppler.  LEFT VENTRICLE PLAX 2D LVIDd:         4.52 cm  Diastology LVIDs:         2.66 cm  LV e' lateral:   8.27 cm/s LV PW:         1.12 cm  LV E/e' lateral: 7.0 LV IVS:        0.90 cm  LV e' medial:    5.55 cm/s LVOT diam:     1.90 cm  LV E/e' medial:  10.4 LV SV:         61.24 ml LV SV Index:   32.56 LVOT Area:     2.84 cm  RIGHT  VENTRICLE RV S prime:     15.10 cm/s TAPSE (M-mode): 2.3 cm LEFT ATRIUM             Index       RIGHT ATRIUM           Index LA diam:        3.80 cm 1.91 cm/m  RA Area:     18.30 cm LA Vol (A2C):   42.8 ml 21.55 ml/m RA Volume:   45.90 ml  23.11 ml/m LA Vol (A4C):   55.4 ml 27.89 ml/m LA Biplane Vol: 52.1 ml 26.23 ml/m  AORTIC VALVE LVOT Vmax:   103.00 cm/s LVOT Vmean:  73.300 cm/s LVOT VTI:    0.216 m  AORTA Ao Root diam: 2.90 cm Ao Asc diam:  3.60 cm MITRAL VALVE                        TRICUSPID VALVE MV Area (PHT): 2.95 cm             TR Peak grad:   26.6 mmHg MV Decel Time: 257 msec             TR Vmax:        258.00 cm/s MV E velocity: 57.70 cm/s 103 cm/s MV A velocity: 80.50 cm/s 70.3 cm/s SHUNTS MV E/A ratio:  0.72       1.5       Systemic VTI:  0.22 m                                     Systemic Diam: 1.90 cm Fransico Him MD Electronically signed by Fransico Him MD Signature Date/Time: 09/03/2019/8:48:53  AM    Final    CT ORBITS WO CONTRAST  Result Date: 09/18/2019 CLINICAL DATA:  Headache, history of gastric cancer EXAM: CT ORBITS WITHOUT CONTRAST TECHNIQUE: Multidetector CT images were obtained using the standard protocol without intravenous contrast. COMPARISON:  08/10/2019 FINDINGS: Orbits: No traumatic or inflammatory finding. Globes, optic nerves, orbital fat, extraocular muscles, vascular structures, and lacrimal glands are normal. Visualized sinuses: Clear. Soft tissues: Soft tissues are unremarkable. Limited intracranial: No significant or unexpected finding. IMPRESSION: No evidence of traumatic or inflammatory process. Electronically Signed   By: Randa Ngo M.D.   On: 09/18/2019 00:54   DG ESOPHAGUS W SINGLE CM (SOL OR THIN BA)  Result Date: 09/05/2019 CLINICAL DATA:  Concern for esophageal dysmotility and reflux, rule out stricture. History of partial gastrectomy. EXAM: ESOPHOGRAM/BARIUM SWALLOW TECHNIQUE: Single contrast examination was performed using thin barium and tablet.  FLUOROSCOPY TIME:  Fluoroscopy Time:  4 minutes 6 seconds Number of Acquired Spot Images: 1 COMPARISON:  None. FINDINGS: Thin barium was administered under fluoroscopic guidance to evaluate esophageal motility. There is moderate to severe dysmotility with slow clearance of contrast particularly in the mid to distal portion of the esophagus. The esophagus completely distends without evidence of stricture ring. There is evidence of spontaneous as well as provoked reflux to the level of the mid esophagus. The patient swallowed a barium tablet which became held up in the mid/distal portion of the esophagus and did not pass with additional sips of water. The tablet eventually passed with a sip of thick barium. IMPRESSION: 1.  Moderate/severe esophageal dysmotility. 2. Spontaneous and provoked reflux to the level of the mid esophagus. 3. Delayed passage of the barium tablet in the mid/distal esophagus. The tablet did not pass with additional sips of water but eventually passed with a sip of thick barium. The esophagus in this region appeared to fully distend, no fixed stricture identified. Electronically Signed   By: Audie Pinto M.D.   On: 09/05/2019 11:20       Discharge Exam: Vitals:   09/27/19 0407 09/27/19 0725  BP: 96/65 98/69  Pulse: 94 97  Resp: 18 18  Temp: 99.3 F (37.4 C) 98.7 F (37.1 C)  SpO2: 100% 98%    GENERAL: No acute distress.  Appears well.  HEENT: MMM.  Vision and hearing grossly intact.  NECK: Supple.  No apparent JVD.  RESP:  No IWOB. Good air movement bilaterally. CVS:  RRR. Heart sounds normal.  ABD/GI/GU: Bowel sounds present. Soft. Non tender.  MSK/EXT:  Moves extremities. No apparent deformity or edema.  SKIN: Stage II sacral decubitus ulcer. NEURO: Awake, alert and oriented appropriately.  No apparent focal neuro deficit. PSYCH: Calm. Normal affect.   The results of significant diagnostics from this hospitalization (including imaging, microbiology, ancillary  and laboratory) are listed below for reference.     Microbiology: Recent Results (from the past 240 hour(s))  Blood culture (routine x 2)     Status: None   Collection Time: 09/17/19  5:10 PM   Specimen: BLOOD  Result Value Ref Range Status   Specimen Description BLOOD LEFT ANTECUBITAL  Final   Special Requests   Final    BOTTLES DRAWN AEROBIC ONLY Blood Culture results may not be optimal due to an inadequate volume of blood received in culture bottles   Culture   Final    NO GROWTH 5 DAYS Performed at [facility], [place] [address] [place], [place] [address]    Report Status 09/22/2019 FINAL  Final  Blood culture (routine x 2)     Status: None   Collection Time: 09/17/19  5:25 PM   Specimen: BLOOD  Result Value Ref Range Status   Specimen Description BLOOD RIGHT ANTECUBITAL  Final   Special Requests   Final    BOTTLES DRAWN AEROBIC AND ANAEROBIC Blood Culture results may not be optimal due to an inadequate volume of blood received in culture bottles   Culture   Final    NO GROWTH 5 DAYS Performed at [facility], [place] [address], [place], [place] [address]    Report Status 09/22/2019 FINAL  Final  SARS CORONAVIRUS 2 (TAT 6-24 HRS) Nasopharyngeal Nasopharyngeal Swab     Status: None   Collection Time: 09/17/19  8:43 PM   Specimen: Nasopharyngeal Swab  Result Value Ref Range Status   SARS Coronavirus 2 NEGATIVE NEGATIVE Final    Comment: (NOTE) SARS-CoV-2 target nucleic acids are NOT DETECTED. The SARS-CoV-2 RNA is generally detectable in upper and lower respiratory specimens during the acute phase of infection. Negative results do not preclude SARS-CoV-2 infection, do not rule out co-infections with other pathogens, and should not be used as the sole basis for treatment or other patient management decisions. Negative results must be combined with clinical observations, patient history, and epidemiological information. The expected result is Negative. Fact Sheet  for Patients: SugarRoll.be Fact Sheet for Healthcare Providers: https://www.woods-mathews.com/ This test is not yet approved or cleared by the [place] FDA and  has been authorized for detection and/or diagnosis of SARS-CoV-2 by FDA under an Emergency Use Authorization (EUA). This EUA will remain  in effect (meaning this test can be used) for the duration of the COVID-19 declaration under Section 56 4(b)(1) of the Act, 21 U.S.C. section 360bbb-3(b)(1), unless the authorization is terminated or revoked sooner. Performed at [facility], [place] [address] [place], [place] [address]   Culture, Urine     Status: Abnormal   Collection Time: 09/18/19 11:55 AM   Specimen: Urine, Clean Catch  Result Value Ref Range Status   Specimen Description URINE, CLEAN CATCH  Final   Special Requests   Final    NONE Performed at [facility], [place] [address] [place], [place] [address]    Culture (A)  Final    >=100,000 COLONIES/mL ESCHERICHIA COLI Confirmed Extended Spectrum Beta-Lactamase Producer (ESBL).  In bloodstream infections from ESBL organisms, carbapenems are preferred over piperacillin/tazobactam. They are shown to have a lower risk of mortality.    Report Status 09/20/2019 FINAL  Final   Organism ID, Bacteria ESCHERICHIA COLI (A)  Final      Susceptibility   Escherichia coli - MIC*    AMPICILLIN >=32 RESISTANT Resistant     CEFAZOLIN >=64 RESISTANT Resistant     CEFTRIAXONE >=64 RESISTANT Resistant     CIPROFLOXACIN >=4 RESISTANT Resistant     GENTAMICIN <=1 SENSITIVE Sensitive     IMIPENEM <=0.25 SENSITIVE Sensitive     NITROFURANTOIN <=16 SENSITIVE Sensitive     TRIMETH/SULFA >=320 RESISTANT Resistant     AMPICILLIN/SULBACTAM 8 SENSITIVE Sensitive     PIP/TAZO <=4 SENSITIVE Sensitive     * >=100,000 COLONIES/mL ESCHERICHIA COLI  SARS CORONAVIRUS 2 (TAT 6-24 HRS) Nasopharyngeal Nasopharyngeal Swab     Status: None   Collection  Time: 09/26/19  5:13 PM   Specimen: Nasopharyngeal Swab  Result Value Ref Range Status   SARS Coronavirus 2 NEGATIVE NEGATIVE Final    Comment: (NOTE) SARS-CoV-2 target  nucleic acids are NOT DETECTED. The SARS-CoV-2 RNA is generally detectable in upper and lower respiratory specimens during the acute phase of infection. Negative results do not preclude SARS-CoV-2 infection, do not rule out co-infections with other pathogens, and should not be used as the sole basis for treatment or other patient management decisions. Negative results must be combined with clinical observations, patient history, and epidemiological information. The expected result is Negative. Fact Sheet for Patients: SugarRoll.be Fact Sheet for Healthcare Providers: https://www.woods-mathews.com/ This test is not yet approved or cleared by the [place] FDA and  has been authorized for detection and/or diagnosis of SARS-CoV-2 by FDA under an Emergency Use Authorization (EUA). This EUA will remain  in effect (meaning this test can be used) for the duration of the COVID-19 declaration under Section 56 4(b)(1) of the Act, 21 U.S.C. section 360bbb-3(b)(1), unless the authorization is terminated or revoked sooner. Performed at [facility], [place] [address], [place], [place] [address]      Labs: BNP (last 3 results) Recent Labs    08/16/19 2022 09/02/19 1217 09/17/19 1725  BNP 76.9 85.8 280.0*   Basic Metabolic Panel: Recent Labs  Lab 09/23/19 0453 09/24/19 0438 09/25/19 0454 09/26/19 0500 09/27/19 0810 09/27/19 0816  NA 143 146* 145 147* 145  --   K 3.2* 3.1* 3.6 4.1 4.8  --   CL 109 111 110 113* 109  --   CO2 26 28 29 29 29   --   GLUCOSE 67* 84 87 166* 152*  --   BUN 19 21 17 14 11   --   CREATININE 1.07* 1.01* 1.08* 0.92 1.17*  --   CALCIUM 7.4* 7.1* 7.0* 7.0* 7.0*  --   MG 2.1 1.6* 1.7 1.7  --  1.7  PHOS 2.8 2.7 2.2* 2.1* 2.2*  --    Liver  Function Tests: Recent Labs  Lab 09/21/19 0520 09/27/19 0810  ALBUMIN 1.2* 1.1*   No results for input(s): LIPASE, AMYLASE in the last 168 hours. No results for input(s): AMMONIA in the last 168 hours. CBC: Recent Labs  Lab 09/23/19 0453 09/23/19 0453 09/24/19 0438 09/25/19 0454 09/25/19 2033 09/26/19 1257 09/27/19 0816  WBC 19.3*  --  15.5* 11.8*  --  10.0 9.8  HGB 9.0*   < > 8.0* 7.7* 8.8* 8.6* 9.0*  HCT 26.2*   < > 24.8* 23.4* 26.2* 26.2* 27.4*  MCV 86.2  --  89.2 88.0  --  88.8 90.4  PLT 309  --  318 345  --  339 346   < > = values in this interval not displayed.   Cardiac Enzymes: No results for input(s): CKTOTAL, CKMB, CKMBINDEX, TROPONINI in the last 168 hours. BNP: Invalid input(s): POCBNP CBG: Recent Labs  Lab 09/26/19 2111 09/27/19 0353 09/27/19 0426 09/27/19 0642 09/27/19 0724  GLUCAP 143* 59* 86 58* 105*   D-Dimer No results for input(s): DDIMER in the last 72 hours. Hgb A1c Recent Labs    09/27/19 0810  HGBA1C 5.6   Lipid Profile No results for input(s): CHOL, HDL, LDLCALC, TRIG, CHOLHDL, LDLDIRECT in the last 72 hours. Thyroid function studies No results for input(s): TSH, T4TOTAL, T3FREE, THYROIDAB in the last 72 hours.  Invalid input(s): FREET3 Anemia work up Recent Labs    09/25/19 1053  VITAMINB12 733  FOLATE 19.5  FERRITIN 208  TIBC NOT CALCULATED  IRON 61  RETICCTPCT 3.2*   Urinalysis    Component Value Date/Time   COLORURINE YELLOW 09/18/2019 1155  APPEARANCEUR CLEAR 09/18/2019 1155   LABSPEC 1.016 09/18/2019 1155   LABSPEC 1.015 04/06/2015 0939   PHURINE 5.0 09/18/2019 1155   GLUCOSEU NEGATIVE 09/18/2019 1155   HGBUR NEGATIVE 09/18/2019 [place] 09/18/2019 1155   BILIRUBINUR 1+ 10/01/2018 1146   KETONESUR NEGATIVE 09/18/2019 1155   PROTEINUR NEGATIVE 09/18/2019 1155   UROBILINOGEN 0.2 10/01/2018 1146   UROBILINOGEN 0.2 04/06/2015 0939   NITRITE NEGATIVE 09/18/2019 1155   LEUKOCYTESUR TRACE (A)  09/18/2019 1155   Sepsis Labs Invalid input(s): PROCALCITONIN,  WBC,  LACTICIDVEN   Time coordinating discharge: 40 minutes  SIGNED:  Mercy Riding, MD  [facility] 09/27/2019, 9:41 AM  If 7PM-7AM, please contact night-coverage [email or web address] Password [email or web address]

## 2019-09-27 NOTE — TOC Transition Note (Signed)
Transition of Care [facility]) - CM/SW Discharge Note   Patient Details  Name: [name] MRN: [id number] Date of Birth: April 22, 1951  [facility]) CM/SW Contact:  Geralynn Ochs, LCSW Phone Number: 09/27/2019, 10:58 AM   Clinical Narrative:   Nurse to call report to [phone], Room 120A.    Final next level of care: Skilled Nursing Facility Barriers to Discharge: Barriers Resolved   Patient Goals and CMS Choice   CMS Medicare.gov Compare Post Acute Care list provided to:: Patient Choice offered to / list presented to : Adult Children, Patient  Discharge Placement              Patient chooses bed at: [facility] Patient to be transferred to facility by: [facility] Name of family member notified: [name], daughter Patient and family notified of of transfer: 09/27/19  Discharge Plan and Services In-house Referral: Clinical Social Work Discharge Planning Services: CM Consult Post Acute Care Choice: [place]                               Social Determinants of Health (SDOH) Interventions     Readmission Risk Interventions Readmission Risk Prevention Plan 09/05/2019 09/05/2019 05/16/2019  Transportation Screening Complete - Complete  Medication Review Press photographer) Complete - Complete  PCP or Specialist appointment within 3-5 days of discharge Complete - -  HRI or Home Care Consult Patient refused - Complete  SW Recovery Care/Counseling Consult Complete - Complete  Palliative Care Screening Not Applicable Not Applicable Not Applicable  Skilled Nursing Facility Complete Complete Not Applicable  Some recent data might be hidden

## 2019-09-27 NOTE — Consult Note (Signed)
   [facility] CM Inpatient Consult   09/27/2019  [name] 1951-04-29 [id number]   Follow up: Active [facility] status/Disposition [Covering]  Patient transitioned to [facility] for patient with [facility] noted.  Plan: Notify [facility] Geriatric NP of patient's disposition to SNF today.  [name], RN BSN [facility] Liaison  [phone] business mobile phone Toll free office [phone]  Fax number: [phone] [email or web address]

## 2019-09-27 NOTE — Progress Notes (Signed)
D/C instructions provided to patient, denies questions/concerns at this time. Report called to Olivia Mackie, RN at receiving facility. Patient transported to front entrance via Brewing technologist, tol well.

## 2019-09-27 NOTE — Progress Notes (Signed)
Physical Therapy Treatment Patient Details Name: [name] MRN: [id number] DOB: 12-22-1950 Today's Date: 09/27/2019    History of Present Illness 69 y.o. female with past medical history significant for DM2, HLD, Asthma who presented to the ED on 2/23 with shock secondary to hypovolemia, adrenal insufficiency, renal insufficiency, and Ecoli UTI. Pt with recent stay at SNF, d/c home for 1 day and readmitted. Pt with recent [facility] and [facility] admission for covid 06/2019.    PT Comments    Pt reclined in chair on arrival.  She required max +2 to elevate from recliner into standing.  Once in standing used stedy to move from recliner to stretcher for transport to rehab.  D/c to rehab today.  PTA returned after nursing requested assistance with transfer out of recliner to stretcher.    Follow Up Recommendations  SNF;Supervision/Assistance - 24 hour     Equipment Recommendations  None recommended by PT    Recommendations for Other Services       Precautions / Restrictions Precautions Precautions: Fall Precaution Comments: Pt reports she was falling every day Restrictions Weight Bearing Restrictions: No    Mobility  Bed Mobility Overal bed mobility: Needs Assistance Bed Mobility: Sit to Supine Rolling: Min guard   Supine to sit: Mod assist Sit to supine: Mod assist;+2 for physical assistance   General bed mobility comments: Pt required assistance to move to supine in stretcher.  +2 to boost to head of stretcher.  Transfers Overall transfer level: Needs assistance Equipment used: Ambulation equipment used(sara stedy) Transfers: Sit to/from Stand Sit to Stand: Max assist;+2 physical assistance         General transfer comment: Increased assistance to boost into standing from recliner. She remains flexed with upper trunk control.  Ambulation/Gait Ambulation/Gait assistance: (NT)               Stairs             Wheelchair Mobility    Modified Rankin (Stroke  Patients Only)       Balance Overall balance assessment: Needs assistance;History of Falls Sitting-balance support: Bilateral upper extremity supported;Feet supported Sitting balance-Leahy Scale: Fair       Standing balance-Leahy Scale: Poor Standing balance comment: Heavy reliance on UE support and external assistance.                            Cognition Arousal/Alertness: Awake/alert Behavior During Therapy: Flat affect Overall Cognitive Status: Within Functional Limits for tasks assessed                                 General Comments: Pt eager to transfer to rehab to learn to walk again.      Exercises      General Comments        Pertinent Vitals/Pain Pain Assessment: Faces Faces Pain Scale: Hurts even more Pain Location: R hip Pain Descriptors / Indicators: Discomfort;Sore Pain Intervention(s): Monitored during session;Repositioned    Home Living                      Prior Function            PT Goals (current goals can now be found in the care plan section) Acute Rehab PT Goals Patient Stated Goal: "I ready to go ." PT Goal Formulation: With patient Potential to Achieve Goals: Good Progress towards PT  goals: Progressing toward goals    Frequency    Min 2X/week      PT Plan Current plan remains appropriate    Co-evaluation              AM-PAC PT "6 Clicks" Mobility   Outcome Measure  Help needed turning from your back to your side while in a flat bed without using bedrails?: A Lot Help needed moving from lying on your back to sitting on the side of a flat bed without using bedrails?: A Lot Help needed moving to and from a bed to a chair (including a wheelchair)?: Total Help needed standing up from a chair using your arms (e.g., wheelchair or bedside chair)?: Total Help needed to walk in hospital room?: Total Help needed climbing 3-5 steps with a railing? : Total 6 Click Score: 8    End of  Session Equipment Utilized During Treatment: Gait belt Activity Tolerance: Patient tolerated treatment well Patient left: with call bell/phone within reach;in chair;with chair alarm set Nurse Communication: Mobility status;Other (comment) PT Visit Diagnosis: Other abnormalities of gait and mobility (R26.89);Muscle weakness (generalized) (M62.81)     Time: 1222-1230 PT Time Calculation (min) (ACUTE ONLY): 8 min  Charges:  $Therapeutic Activity: 8-22 mins                     [name] , PTA Acute Rehabilitation Services Pager [phone] Office [phone]     [name] [name] 09/27/2019, 12:50 PM

## 2019-10-01 DIAGNOSIS — M6281 Muscle weakness (generalized): Secondary | ICD-10-CM | POA: Diagnosis not present

## 2019-10-01 DIAGNOSIS — R5381 Other malaise: Secondary | ICD-10-CM | POA: Diagnosis not present

## 2019-10-01 DIAGNOSIS — G8929 Other chronic pain: Secondary | ICD-10-CM | POA: Diagnosis not present

## 2019-10-01 DIAGNOSIS — R262 Difficulty in walking, not elsewhere classified: Secondary | ICD-10-CM | POA: Diagnosis not present

## 2019-10-02 ENCOUNTER — Other Ambulatory Visit: Payer: Self-pay | Admitting: *Deleted

## 2019-10-02 ENCOUNTER — Encounter: Payer: Self-pay | Admitting: *Deleted

## 2019-10-02 NOTE — Patient Outreach (Signed)
Care coordination:  Pt was hospitalized 09/17/19-09/27/19(Dx: hypotension, AKI, Septic shock due to UTI,  and discharged to [facility] for rehabilitation, change in vision questionable injury or fall at SNF, Stage II sacral pressure wound. Pt spent several days in ICU for septic shock this admission. Palliative care consult done, remains full code. Family is helpful. Contact Katieann Hungate (mobile [phone].  Talked with pt's daughter, Tameisha Covell, today for pt update and coordination of care. Alanna has been cooresponding regularly with this care Freight forwarder. Pt has been discharged from the hospital and went to [facility] for rehab. Alanna was able to do a through the window visit with her mother yesterday. This transition has gone smoothly and she has been receiving the services she was sent for.  Advised Alanna, that this CM will not follow pt now, however, a peer that specializes in [facility] care will follow her upon discharge. Pt has recently changed from [place] to [place]. I have asked to her alert me when she is notified of pending discharge so I can notify her new care manager. She is in agreement with this plan. She expresses her appreciation for the support during her mother's difficult days.  CM has kept pt active throughout her SNF stays and hospitalizations. Her care plan has not been updated since 08/05/19 due to frequency of admissions and SNF stays.  Closing case as pt is in SNF but will be pending reassignment post discharge.  Eulah Pont. Myrtie Neither, MSN, [facility] Gerontological Nurse Practitioner [facility] Care Management [phone]

## 2019-10-03 DIAGNOSIS — B948 Sequelae of other specified infectious and parasitic diseases: Secondary | ICD-10-CM | POA: Diagnosis not present

## 2019-10-03 DIAGNOSIS — I10 Essential (primary) hypertension: Secondary | ICD-10-CM | POA: Diagnosis not present

## 2019-10-03 DIAGNOSIS — I2510 Atherosclerotic heart disease of native coronary artery without angina pectoris: Secondary | ICD-10-CM | POA: Diagnosis not present

## 2019-10-03 DIAGNOSIS — M6281 Muscle weakness (generalized): Secondary | ICD-10-CM | POA: Diagnosis not present

## 2019-10-03 DIAGNOSIS — E119 Type 2 diabetes mellitus without complications: Secondary | ICD-10-CM | POA: Diagnosis not present

## 2019-10-03 DIAGNOSIS — R262 Difficulty in walking, not elsewhere classified: Secondary | ICD-10-CM | POA: Diagnosis not present

## 2019-10-03 DIAGNOSIS — R5381 Other malaise: Secondary | ICD-10-CM | POA: Diagnosis not present

## 2019-10-03 DIAGNOSIS — G8929 Other chronic pain: Secondary | ICD-10-CM | POA: Diagnosis not present

## 2019-10-07 DIAGNOSIS — Z8616 Personal history of COVID-19: Secondary | ICD-10-CM | POA: Diagnosis not present

## 2019-10-07 DIAGNOSIS — R031 Nonspecific low blood-pressure reading: Secondary | ICD-10-CM | POA: Diagnosis not present

## 2019-10-07 DIAGNOSIS — R5381 Other malaise: Secondary | ICD-10-CM | POA: Diagnosis not present

## 2019-10-07 DIAGNOSIS — G894 Chronic pain syndrome: Secondary | ICD-10-CM | POA: Diagnosis not present

## 2019-10-07 DIAGNOSIS — R600 Localized edema: Secondary | ICD-10-CM | POA: Diagnosis not present

## 2019-10-07 DIAGNOSIS — M6281 Muscle weakness (generalized): Secondary | ICD-10-CM | POA: Diagnosis not present

## 2019-10-08 DIAGNOSIS — G894 Chronic pain syndrome: Secondary | ICD-10-CM | POA: Diagnosis not present

## 2019-10-08 DIAGNOSIS — L309 Dermatitis, unspecified: Secondary | ICD-10-CM | POA: Diagnosis not present

## 2019-10-08 DIAGNOSIS — R600 Localized edema: Secondary | ICD-10-CM | POA: Diagnosis not present

## 2019-10-08 DIAGNOSIS — R031 Nonspecific low blood-pressure reading: Secondary | ICD-10-CM | POA: Diagnosis not present

## 2019-10-08 DIAGNOSIS — M79605 Pain in left leg: Secondary | ICD-10-CM | POA: Diagnosis not present

## 2019-10-08 DIAGNOSIS — M79604 Pain in right leg: Secondary | ICD-10-CM | POA: Diagnosis not present

## 2019-10-10 DIAGNOSIS — L309 Dermatitis, unspecified: Secondary | ICD-10-CM | POA: Diagnosis not present

## 2019-10-10 DIAGNOSIS — R600 Localized edema: Secondary | ICD-10-CM | POA: Diagnosis not present

## 2019-10-14 DIAGNOSIS — Z7901 Long term (current) use of anticoagulants: Secondary | ICD-10-CM | POA: Diagnosis not present

## 2019-10-14 DIAGNOSIS — G894 Chronic pain syndrome: Secondary | ICD-10-CM | POA: Diagnosis not present

## 2019-10-14 DIAGNOSIS — M79604 Pain in right leg: Secondary | ICD-10-CM | POA: Diagnosis not present

## 2019-10-14 DIAGNOSIS — R600 Localized edema: Secondary | ICD-10-CM | POA: Diagnosis not present

## 2019-10-16 DIAGNOSIS — R600 Localized edema: Secondary | ICD-10-CM | POA: Diagnosis not present

## 2019-10-16 DIAGNOSIS — Z7901 Long term (current) use of anticoagulants: Secondary | ICD-10-CM | POA: Diagnosis not present

## 2019-10-16 DIAGNOSIS — M79604 Pain in right leg: Secondary | ICD-10-CM | POA: Diagnosis not present

## 2019-10-16 DIAGNOSIS — G894 Chronic pain syndrome: Secondary | ICD-10-CM | POA: Diagnosis not present

## 2019-10-17 DIAGNOSIS — G894 Chronic pain syndrome: Secondary | ICD-10-CM | POA: Diagnosis not present

## 2019-10-17 DIAGNOSIS — R031 Nonspecific low blood-pressure reading: Secondary | ICD-10-CM | POA: Diagnosis not present

## 2019-10-17 DIAGNOSIS — M79604 Pain in right leg: Secondary | ICD-10-CM | POA: Diagnosis not present

## 2019-10-17 DIAGNOSIS — Z7901 Long term (current) use of anticoagulants: Secondary | ICD-10-CM | POA: Diagnosis not present

## 2019-10-17 DIAGNOSIS — R000 Tachycardia, unspecified: Secondary | ICD-10-CM | POA: Diagnosis not present

## 2019-10-17 DIAGNOSIS — R5381 Other malaise: Secondary | ICD-10-CM | POA: Diagnosis not present

## 2019-10-17 DIAGNOSIS — R600 Localized edema: Secondary | ICD-10-CM | POA: Diagnosis not present

## 2019-10-18 DIAGNOSIS — M79604 Pain in right leg: Secondary | ICD-10-CM | POA: Diagnosis not present

## 2019-10-18 DIAGNOSIS — R031 Nonspecific low blood-pressure reading: Secondary | ICD-10-CM | POA: Diagnosis not present

## 2019-10-18 DIAGNOSIS — R262 Difficulty in walking, not elsewhere classified: Secondary | ICD-10-CM | POA: Diagnosis not present

## 2019-10-18 DIAGNOSIS — G894 Chronic pain syndrome: Secondary | ICD-10-CM | POA: Diagnosis not present

## 2019-10-18 DIAGNOSIS — G8929 Other chronic pain: Secondary | ICD-10-CM | POA: Diagnosis not present

## 2019-10-18 DIAGNOSIS — Z7901 Long term (current) use of anticoagulants: Secondary | ICD-10-CM | POA: Diagnosis not present

## 2019-10-18 DIAGNOSIS — N390 Urinary tract infection, site not specified: Secondary | ICD-10-CM | POA: Diagnosis not present

## 2019-10-18 DIAGNOSIS — R000 Tachycardia, unspecified: Secondary | ICD-10-CM | POA: Diagnosis not present

## 2019-10-18 DIAGNOSIS — R5381 Other malaise: Secondary | ICD-10-CM | POA: Diagnosis not present

## 2019-10-18 DIAGNOSIS — M6281 Muscle weakness (generalized): Secondary | ICD-10-CM | POA: Diagnosis not present

## 2019-10-22 ENCOUNTER — Emergency Department (HOSPITAL_COMMUNITY): Payer: Medicare HMO

## 2019-10-22 ENCOUNTER — Ambulatory Visit: Payer: Medicare HMO | Admitting: Orthopedic Surgery

## 2019-10-22 ENCOUNTER — Inpatient Hospital Stay (HOSPITAL_COMMUNITY)
Admission: EM | Admit: 2019-10-22 | Discharge: 2019-10-30 | DRG: 689 | Disposition: A | Discharge status: Skilled Nursing Facility | Payer: Medicare HMO | Source: Skilled Nursing Facility | Attending: Internal Medicine | Admitting: Internal Medicine

## 2019-10-22 ENCOUNTER — Other Ambulatory Visit: Payer: Self-pay

## 2019-10-22 DIAGNOSIS — E1169 Type 2 diabetes mellitus with other specified complication: Secondary | ICD-10-CM | POA: Diagnosis present

## 2019-10-22 DIAGNOSIS — I82501 Chronic embolism and thrombosis of unspecified deep veins of right lower extremity: Secondary | ICD-10-CM | POA: Diagnosis not present

## 2019-10-22 DIAGNOSIS — E875 Hyperkalemia: Secondary | ICD-10-CM | POA: Diagnosis present

## 2019-10-22 DIAGNOSIS — G894 Chronic pain syndrome: Secondary | ICD-10-CM | POA: Diagnosis present

## 2019-10-22 DIAGNOSIS — Z87891 Personal history of nicotine dependence: Secondary | ICD-10-CM

## 2019-10-22 DIAGNOSIS — Z923 Personal history of irradiation: Secondary | ICD-10-CM

## 2019-10-22 DIAGNOSIS — Z1612 Extended spectrum beta lactamase (ESBL) resistance: Secondary | ICD-10-CM | POA: Diagnosis not present

## 2019-10-22 DIAGNOSIS — M25552 Pain in left hip: Secondary | ICD-10-CM

## 2019-10-22 DIAGNOSIS — I2510 Atherosclerotic heart disease of native coronary artery without angina pectoris: Secondary | ICD-10-CM | POA: Diagnosis not present

## 2019-10-22 DIAGNOSIS — N1832 Chronic kidney disease, stage 3b: Secondary | ICD-10-CM | POA: Diagnosis present

## 2019-10-22 DIAGNOSIS — R638 Other symptoms and signs concerning food and fluid intake: Secondary | ICD-10-CM

## 2019-10-22 DIAGNOSIS — I9589 Other hypotension: Secondary | ICD-10-CM | POA: Diagnosis not present

## 2019-10-22 DIAGNOSIS — R4182 Altered mental status, unspecified: Secondary | ICD-10-CM | POA: Diagnosis not present

## 2019-10-22 DIAGNOSIS — K2970 Gastritis, unspecified, without bleeding: Secondary | ICD-10-CM | POA: Diagnosis present

## 2019-10-22 DIAGNOSIS — Z86718 Personal history of other venous thrombosis and embolism: Secondary | ICD-10-CM

## 2019-10-22 DIAGNOSIS — B948 Sequelae of other specified infectious and parasitic diseases: Secondary | ICD-10-CM

## 2019-10-22 DIAGNOSIS — I82401 Acute embolism and thrombosis of unspecified deep veins of right lower extremity: Secondary | ICD-10-CM | POA: Diagnosis not present

## 2019-10-22 DIAGNOSIS — N1831 Chronic kidney disease, stage 3a: Secondary | ICD-10-CM | POA: Diagnosis not present

## 2019-10-22 DIAGNOSIS — Z833 Family history of diabetes mellitus: Secondary | ICD-10-CM

## 2019-10-22 DIAGNOSIS — H538 Other visual disturbances: Secondary | ICD-10-CM | POA: Diagnosis present

## 2019-10-22 DIAGNOSIS — E860 Dehydration: Secondary | ICD-10-CM | POA: Diagnosis not present

## 2019-10-22 DIAGNOSIS — Z7984 Long term (current) use of oral hypoglycemic drugs: Secondary | ICD-10-CM

## 2019-10-22 DIAGNOSIS — R918 Other nonspecific abnormal finding of lung field: Secondary | ICD-10-CM | POA: Diagnosis not present

## 2019-10-22 DIAGNOSIS — Z7901 Long term (current) use of anticoagulants: Secondary | ICD-10-CM

## 2019-10-22 DIAGNOSIS — K5900 Constipation, unspecified: Secondary | ICD-10-CM | POA: Diagnosis present

## 2019-10-22 DIAGNOSIS — R5381 Other malaise: Secondary | ICD-10-CM | POA: Diagnosis not present

## 2019-10-22 DIAGNOSIS — R52 Pain, unspecified: Secondary | ICD-10-CM | POA: Diagnosis not present

## 2019-10-22 DIAGNOSIS — M7989 Other specified soft tissue disorders: Secondary | ICD-10-CM | POA: Diagnosis not present

## 2019-10-22 DIAGNOSIS — R262 Difficulty in walking, not elsewhere classified: Secondary | ICD-10-CM | POA: Diagnosis not present

## 2019-10-22 DIAGNOSIS — G4730 Sleep apnea, unspecified: Secondary | ICD-10-CM | POA: Diagnosis present

## 2019-10-22 DIAGNOSIS — J9601 Acute respiratory failure with hypoxia: Secondary | ICD-10-CM | POA: Diagnosis not present

## 2019-10-22 DIAGNOSIS — E1165 Type 2 diabetes mellitus with hyperglycemia: Secondary | ICD-10-CM | POA: Diagnosis not present

## 2019-10-22 DIAGNOSIS — N183 Chronic kidney disease, stage 3 unspecified: Secondary | ICD-10-CM | POA: Diagnosis present

## 2019-10-22 DIAGNOSIS — E785 Hyperlipidemia, unspecified: Secondary | ICD-10-CM | POA: Diagnosis not present

## 2019-10-22 DIAGNOSIS — I129 Hypertensive chronic kidney disease with stage 1 through stage 4 chronic kidney disease, or unspecified chronic kidney disease: Secondary | ICD-10-CM | POA: Diagnosis present

## 2019-10-22 DIAGNOSIS — N179 Acute kidney failure, unspecified: Secondary | ICD-10-CM | POA: Diagnosis not present

## 2019-10-22 DIAGNOSIS — Z820 Family history of epilepsy and other diseases of the nervous system: Secondary | ICD-10-CM

## 2019-10-22 DIAGNOSIS — R0902 Hypoxemia: Secondary | ICD-10-CM

## 2019-10-22 DIAGNOSIS — E1121 Type 2 diabetes mellitus with diabetic nephropathy: Secondary | ICD-10-CM | POA: Diagnosis not present

## 2019-10-22 DIAGNOSIS — Z8619 Personal history of other infectious and parasitic diseases: Secondary | ICD-10-CM

## 2019-10-22 DIAGNOSIS — Z8673 Personal history of transient ischemic attack (TIA), and cerebral infarction without residual deficits: Secondary | ICD-10-CM

## 2019-10-22 DIAGNOSIS — B9620 Unspecified Escherichia coli [E. coli] as the cause of diseases classified elsewhere: Secondary | ICD-10-CM | POA: Diagnosis not present

## 2019-10-22 DIAGNOSIS — Z801 Family history of malignant neoplasm of trachea, bronchus and lung: Secondary | ICD-10-CM

## 2019-10-22 DIAGNOSIS — R1084 Generalized abdominal pain: Secondary | ICD-10-CM | POA: Diagnosis not present

## 2019-10-22 DIAGNOSIS — Z885 Allergy status to narcotic agent status: Secondary | ICD-10-CM

## 2019-10-22 DIAGNOSIS — Z6835 Body mass index (BMI) 35.0-35.9, adult: Secondary | ICD-10-CM | POA: Diagnosis not present

## 2019-10-22 DIAGNOSIS — J45909 Unspecified asthma, uncomplicated: Secondary | ICD-10-CM | POA: Diagnosis present

## 2019-10-22 DIAGNOSIS — G9341 Metabolic encephalopathy: Secondary | ICD-10-CM | POA: Diagnosis not present

## 2019-10-22 DIAGNOSIS — Z85028 Personal history of other malignant neoplasm of stomach: Secondary | ICD-10-CM

## 2019-10-22 DIAGNOSIS — N390 Urinary tract infection, site not specified: Principal | ICD-10-CM | POA: Diagnosis present

## 2019-10-22 DIAGNOSIS — R627 Adult failure to thrive: Secondary | ICD-10-CM | POA: Diagnosis present

## 2019-10-22 DIAGNOSIS — R5383 Other fatigue: Secondary | ICD-10-CM | POA: Diagnosis not present

## 2019-10-22 DIAGNOSIS — D509 Iron deficiency anemia, unspecified: Secondary | ICD-10-CM | POA: Diagnosis not present

## 2019-10-22 DIAGNOSIS — R110 Nausea: Secondary | ICD-10-CM | POA: Diagnosis not present

## 2019-10-22 DIAGNOSIS — I959 Hypotension, unspecified: Secondary | ICD-10-CM | POA: Diagnosis present

## 2019-10-22 DIAGNOSIS — G8929 Other chronic pain: Secondary | ICD-10-CM | POA: Diagnosis not present

## 2019-10-22 DIAGNOSIS — J90 Pleural effusion, not elsewhere classified: Secondary | ICD-10-CM | POA: Diagnosis not present

## 2019-10-22 DIAGNOSIS — Z955 Presence of coronary angioplasty implant and graft: Secondary | ICD-10-CM | POA: Diagnosis not present

## 2019-10-22 DIAGNOSIS — E1142 Type 2 diabetes mellitus with diabetic polyneuropathy: Secondary | ICD-10-CM | POA: Diagnosis present

## 2019-10-22 DIAGNOSIS — E440 Moderate protein-calorie malnutrition: Secondary | ICD-10-CM | POA: Diagnosis not present

## 2019-10-22 DIAGNOSIS — D573 Sickle-cell trait: Secondary | ICD-10-CM | POA: Diagnosis present

## 2019-10-22 DIAGNOSIS — I252 Old myocardial infarction: Secondary | ICD-10-CM

## 2019-10-22 DIAGNOSIS — K219 Gastro-esophageal reflux disease without esophagitis: Secondary | ICD-10-CM | POA: Diagnosis present

## 2019-10-22 DIAGNOSIS — E861 Hypovolemia: Secondary | ICD-10-CM | POA: Diagnosis not present

## 2019-10-22 DIAGNOSIS — M6281 Muscle weakness (generalized): Secondary | ICD-10-CM | POA: Diagnosis not present

## 2019-10-22 DIAGNOSIS — E1122 Type 2 diabetes mellitus with diabetic chronic kidney disease: Secondary | ICD-10-CM | POA: Diagnosis present

## 2019-10-22 DIAGNOSIS — D631 Anemia in chronic kidney disease: Secondary | ICD-10-CM | POA: Diagnosis present

## 2019-10-22 DIAGNOSIS — Z888 Allergy status to other drugs, medicaments and biological substances status: Secondary | ICD-10-CM

## 2019-10-22 DIAGNOSIS — Z96653 Presence of artificial knee joint, bilateral: Secondary | ICD-10-CM | POA: Diagnosis present

## 2019-10-22 DIAGNOSIS — F419 Anxiety disorder, unspecified: Secondary | ICD-10-CM | POA: Diagnosis present

## 2019-10-22 DIAGNOSIS — M2550 Pain in unspecified joint: Secondary | ICD-10-CM | POA: Diagnosis not present

## 2019-10-22 DIAGNOSIS — F39 Unspecified mood [affective] disorder: Secondary | ICD-10-CM | POA: Diagnosis present

## 2019-10-22 DIAGNOSIS — J984 Other disorders of lung: Secondary | ICD-10-CM | POA: Diagnosis not present

## 2019-10-22 DIAGNOSIS — Z9221 Personal history of antineoplastic chemotherapy: Secondary | ICD-10-CM

## 2019-10-22 DIAGNOSIS — Z79899 Other long term (current) drug therapy: Secondary | ICD-10-CM

## 2019-10-22 DIAGNOSIS — Z7401 Bed confinement status: Secondary | ICD-10-CM | POA: Diagnosis not present

## 2019-10-22 DIAGNOSIS — K529 Noninfective gastroenteritis and colitis, unspecified: Secondary | ICD-10-CM | POA: Diagnosis not present

## 2019-10-22 DIAGNOSIS — B9629 Other Escherichia coli [E. coli] as the cause of diseases classified elsewhere: Secondary | ICD-10-CM

## 2019-10-22 DIAGNOSIS — K760 Fatty (change of) liver, not elsewhere classified: Secondary | ICD-10-CM | POA: Diagnosis not present

## 2019-10-22 DIAGNOSIS — Z03818 Encounter for observation for suspected exposure to other biological agents ruled out: Secondary | ICD-10-CM | POA: Diagnosis not present

## 2019-10-22 LAB — URINALYSIS, ROUTINE W REFLEX MICROSCOPIC
Bilirubin Urine: NEGATIVE
Glucose, UA: NEGATIVE mg/dL
Ketones, ur: NEGATIVE mg/dL
Nitrite: NEGATIVE
Protein, ur: NEGATIVE mg/dL
Specific Gravity, Urine: 1.015 (ref 1.005–1.030)
pH: 5 (ref 5.0–8.0)

## 2019-10-22 LAB — CBC
HCT: 26.6 % — ABNORMAL LOW (ref 36.0–46.0)
Hemoglobin: 9.3 g/dL — ABNORMAL LOW (ref 12.0–15.0)
MCH: 30.5 pg (ref 26.0–34.0)
MCHC: 35 g/dL (ref 30.0–36.0)
MCV: 87.2 fL (ref 80.0–100.0)
Platelets: 232 10*3/uL (ref 150–400)
RBC: 3.05 MIL/uL — ABNORMAL LOW (ref 3.87–5.11)
RDW: 16.2 % — ABNORMAL HIGH (ref 11.5–15.5)
WBC: 11.7 10*3/uL — ABNORMAL HIGH (ref 4.0–10.5)
nRBC: 0.5 % — ABNORMAL HIGH (ref 0.0–0.2)

## 2019-10-22 LAB — COMPREHENSIVE METABOLIC PANEL
ALT: 29 U/L (ref 0–44)
AST: 32 U/L (ref 15–41)
Albumin: 1.6 g/dL — ABNORMAL LOW (ref 3.5–5.0)
Alkaline Phosphatase: 140 U/L — ABNORMAL HIGH (ref 38–126)
Anion gap: 11 (ref 5–15)
BUN: 16 mg/dL (ref 8–23)
CO2: 23 mmol/L (ref 22–32)
Calcium: 7.6 mg/dL — ABNORMAL LOW (ref 8.9–10.3)
Chloride: 102 mmol/L (ref 98–111)
Creatinine, Ser: 1.87 mg/dL — ABNORMAL HIGH (ref 0.44–1.00)
GFR calc Af Amer: 32 mL/min — ABNORMAL LOW (ref >60)
GFR calc non Af Amer: 27 mL/min — ABNORMAL LOW (ref >60)
Glucose, Bld: 111 mg/dL — ABNORMAL HIGH (ref 70–99)
Potassium: 5.2 mmol/L — ABNORMAL HIGH (ref 3.5–5.1)
Sodium: 136 mmol/L (ref 135–145)
Total Bilirubin: 1.2 mg/dL (ref 0.3–1.2)
Total Protein: 5 g/dL — ABNORMAL LOW (ref 6.5–8.1)

## 2019-10-22 LAB — LIPASE, BLOOD: Lipase: 11 U/L (ref 11–51)

## 2019-10-22 MED ORDER — OXYBUTYNIN CHLORIDE ER 5 MG PO TB24
5.0000 mg | ORAL_TABLET | Freq: Every day | ORAL | Status: DC
  Administered 2019-10-23 – 2019-10-26 (×4): 5 mg via ORAL
  Filled 2019-10-22 (×5): qty 1

## 2019-10-22 MED ORDER — SODIUM CHLORIDE 0.9% FLUSH
3.0000 mL | Freq: Once | INTRAVENOUS | Status: AC
  Administered 2019-10-22: 20:00:00 3 mL via INTRAVENOUS

## 2019-10-22 MED ORDER — SODIUM CHLORIDE 0.9 % IV BOLUS
1000.0000 mL | Freq: Once | INTRAVENOUS | Status: AC
  Administered 2019-10-23: 1000 mL via INTRAVENOUS

## 2019-10-22 MED ORDER — ACETAMINOPHEN 500 MG PO TABS: 1000.0000 mg | ORAL_TABLET | Freq: Three times a day (TID) | ORAL | Status: DC | PRN

## 2019-10-22 MED ORDER — ACETAMINOPHEN 650 MG RE SUPP: 650.0000 mg | Freq: Four times a day (QID) | RECTAL | Status: DC | PRN

## 2019-10-22 MED ORDER — ACETAMINOPHEN 325 MG PO TABS
650.0000 mg | ORAL_TABLET | Freq: Four times a day (QID) | ORAL | Status: DC | PRN
  Administered 2019-10-24 – 2019-10-25 (×2): 650 mg via ORAL
  Filled 2019-10-22 (×2): qty 2

## 2019-10-22 MED ORDER — TRAMADOL HCL 50 MG PO TABS
50.0000 mg | ORAL_TABLET | Freq: Three times a day (TID) | ORAL | Status: DC | PRN
  Administered 2019-10-23 – 2019-10-24 (×2): 50 mg via ORAL
  Filled 2019-10-22 (×3): qty 1

## 2019-10-22 MED ORDER — ONDANSETRON HCL 4 MG/2ML IJ SOLN
4.0000 mg | Freq: Four times a day (QID) | INTRAMUSCULAR | Status: DC | PRN
  Administered 2019-10-29: 4 mg via INTRAVENOUS
  Filled 2019-10-22: qty 2

## 2019-10-22 MED ORDER — ALBUTEROL SULFATE HFA 108 (90 BASE) MCG/ACT IN AERS: 2.0000 | INHALATION_SPRAY | Freq: Four times a day (QID) | RESPIRATORY_TRACT | Status: DC | PRN

## 2019-10-22 MED ORDER — METOPROLOL TARTRATE 12.5 MG HALF TABLET
12.5000 mg | ORAL_TABLET | Freq: Two times a day (BID) | ORAL | Status: DC
  Administered 2019-10-23 – 2019-10-30 (×12): 12.5 mg via ORAL
  Filled 2019-10-22 (×15): qty 1

## 2019-10-22 MED ORDER — NYSTATIN 100000 UNIT/GM EX POWD
Freq: Three times a day (TID) | CUTANEOUS | Status: DC
  Filled 2019-10-22 (×2): qty 15

## 2019-10-22 MED ORDER — ROSUVASTATIN CALCIUM 20 MG PO TABS
20.0000 mg | ORAL_TABLET | Freq: Every day | ORAL | Status: DC
  Administered 2019-10-23 – 2019-10-30 (×8): 20 mg via ORAL
  Filled 2019-10-22 (×8): qty 1

## 2019-10-22 MED ORDER — SODIUM CHLORIDE 0.9 % IV BOLUS
1000.0000 mL | Freq: Once | INTRAVENOUS | Status: AC
  Administered 2019-10-22: 20:00:00 1000 mL via INTRAVENOUS

## 2019-10-22 MED ORDER — METOCLOPRAMIDE HCL 5 MG PO TABS: 5.0000 mg | ORAL_TABLET | Freq: Three times a day (TID) | ORAL | Status: DC | PRN

## 2019-10-22 MED ORDER — INSULIN ASPART 100 UNIT/ML ~~LOC~~ SOLN
0.0000 [IU] | Freq: Three times a day (TID) | SUBCUTANEOUS | Status: DC
  Administered 2019-10-24 – 2019-10-25 (×2): 2 [IU] via SUBCUTANEOUS
  Administered 2019-10-26: 3 [IU] via SUBCUTANEOUS
  Administered 2019-10-27 – 2019-10-28 (×3): 2 [IU] via SUBCUTANEOUS
  Administered 2019-10-28: 3 [IU] via SUBCUTANEOUS
  Administered 2019-10-29 – 2019-10-30 (×2): 2 [IU] via SUBCUTANEOUS
  Administered 2019-10-30: 5 [IU] via SUBCUTANEOUS

## 2019-10-22 MED ORDER — PANTOPRAZOLE SODIUM 40 MG PO TBEC
40.0000 mg | DELAYED_RELEASE_TABLET | Freq: Every day | ORAL | Status: DC
  Administered 2019-10-23 – 2019-10-30 (×8): 40 mg via ORAL
  Filled 2019-10-22 (×8): qty 1

## 2019-10-22 MED ORDER — APIXABAN 5 MG PO TABS
5.0000 mg | ORAL_TABLET | Freq: Two times a day (BID) | ORAL | Status: DC
  Administered 2019-10-23 – 2019-10-30 (×17): 5 mg via ORAL
  Filled 2019-10-22 (×17): qty 1

## 2019-10-22 MED ORDER — TRAZODONE HCL 50 MG PO TABS: 25.0000 mg | ORAL_TABLET | Freq: Every evening | ORAL | Status: DC | PRN

## 2019-10-22 MED ORDER — INSULIN ASPART 100 UNIT/ML ~~LOC~~ SOLN: 0.0000 [IU] | Freq: Every day | SUBCUTANEOUS | Status: DC

## 2019-10-22 MED ORDER — PIPERACILLIN-TAZOBACTAM 3.375 G IVPB 30 MIN: 3.3750 g | Freq: Once | INTRAVENOUS | Status: DC

## 2019-10-22 MED ORDER — GABAPENTIN 100 MG PO CAPS: 300.0000 mg | ORAL_CAPSULE | ORAL | Status: DC

## 2019-10-22 MED ORDER — ADULT MULTIVITAMIN W/MINERALS CH
1.0000 | ORAL_TABLET | Freq: Every day | ORAL | Status: DC
  Administered 2019-10-23 – 2019-10-30 (×8): 1 via ORAL
  Filled 2019-10-22 (×8): qty 1

## 2019-10-22 MED ORDER — SERTRALINE HCL 50 MG PO TABS
50.0000 mg | ORAL_TABLET | Freq: Every day | ORAL | Status: DC
  Administered 2019-10-23 – 2019-10-30 (×8): 50 mg via ORAL
  Filled 2019-10-22 (×8): qty 1

## 2019-10-22 MED ORDER — MIDODRINE HCL 5 MG PO TABS
10.0000 mg | ORAL_TABLET | Freq: Three times a day (TID) | ORAL | Status: DC
  Administered 2019-10-23 – 2019-10-30 (×23): 10 mg via ORAL
  Filled 2019-10-22 (×23): qty 2

## 2019-10-22 MED ORDER — ALBUTEROL SULFATE (2.5 MG/3ML) 0.083% IN NEBU: 2.5000 mg | INHALATION_SOLUTION | Freq: Four times a day (QID) | RESPIRATORY_TRACT | Status: DC | PRN

## 2019-10-22 MED ORDER — ONDANSETRON HCL 4 MG PO TABS: 4.0000 mg | ORAL_TABLET | Freq: Four times a day (QID) | ORAL | Status: DC | PRN

## 2019-10-22 NOTE — ED Provider Notes (Signed)
[facility] EMERGENCY DEPARTMENT Provider Note   CSN: [id number] Arrival date & time: 10/22/19  1748     History Chief Complaint  Patient presents with  . Abdominal Pain    [name] is a 69 y.o. female.  69yo F w/ PMH including gastric cancer, HTN, MI, T2DM who p/w abdominal pain.  Patient reports a 3-day history of intermittent, central, nonradiating abdominal pain associated with nausea and decreased appetite.  She reports some constipation, last bowel movement was 4 days ago.  No diarrhea or bloody stools.  No urinary symptoms, fevers, URI symptoms.  She was started on ciprofloxacin on 3/24 for suspected UTI at her nursing facility.  The history is provided by the patient.  Abdominal Pain      Past Medical History:  Diagnosis Date  . Asthma   . Degenerative joint disease   . Diabetes mellitus   . Dyspnea   . GERD (gastroesophageal reflux disease)   . Hyperlipidemia    no longer on medication for this  . Hypertension    patient denies  . Myocardial infarction ([place]) 08/2018  . Neuropathy    bilateral hands and feet  . Sickle cell trait ([place])   . Sleep apnea   . stomach ca dx'd 2010   chemo/xrt comp 05/2009  . TIA (transient ischemic attack) 07/2015    Patient Active Problem List   Diagnosis Date Noted  . Goals of care, counseling/discussion   . Palliative care by specialist   . DNR (do not resuscitate) discussion   . Pressure injury of skin 09/18/2019  . Elevated lactic acid level   . Sepsis due to gram-negative UTI ([place]) 09/04/2019  . Urinary tract infection due to extended-spectrum beta lactamase (ESBL) producing Escherichia coli 09/04/2019  . Acute lower UTI 09/02/2019  . History of DVT (deep vein thrombosis) 09/02/2019  . Drug-induced polyneuropathy ([place]) 08/18/2019  . FTT (failure to thrive) in adult 08/17/2019  . AKI (acute kidney injury) ([place]) 08/17/2019  . SOB (shortness of breath) 08/16/2019  . Lower extremity edema  08/16/2019  . Fall 08/08/2019  . Hypertension 08/08/2019  . COVID-19 virus infection 07/25/2019  . Elevated LFTs 07/25/2019  . COVID-19 07/25/2019  . Allergic contact dermatitis due to adhesives 05/23/2019  . Insomnia 05/23/2019  . SIRS (systemic inflammatory response syndrome) ([place]) 05/14/2019  . NSTEMI (non-ST elevated myocardial infarction) ([place]) 05/14/2019  . Hyperglycemia   . Neutropenia ([place])   . CAD (coronary artery disease)   . Obesity with serious comorbidity 04/16/2019  . Colitis 03/27/2019  . Falls frequently 03/27/2019  . Acute gastroenteritis 03/22/2019  . Hematoma of scalp 03/14/2019  . Vitamin D deficiency 11/09/2018  . Muscle spasm 10/19/2018  . Chest pain 09/07/2018  . Palpitations 09/07/2018  . Other fatigue 09/07/2018  . Acute ST elevation myocardial infarction (STEMI) of inferior wall ([place]) 08/27/2018    Class: Hospitalized for  . Atherosclerotic heart disease of native coronary artery with other forms of angina pectoris ([place]) 08/27/2018    Class: Diagnosis of  . Acute vaginitis 08/20/2018  . Hyperlipidemia associated with type 2 diabetes mellitus ([place]) 04/09/2018  . Bronchitis 11/02/2017  . Sepsis ([place]) 11/02/2017  . Leucocytosis 11/02/2017  . Moderate persistent asthma with acute exacerbation 05/26/2017  . Influenza A 05/26/2017  . Acute pain of right shoulder 03/07/2017  . Acute bilateral low back pain without sciatica 03/07/2017  . Chronic pain 03/07/2017  . Fatty liver 03/24/2016  . Weakness 03/23/2016  . Unexplained weight loss  03/23/2016  . Melena 03/23/2016  . Hypotension 02/03/2016  . Syncope 02/03/2016  . Hypomagnesemia 02/03/2016  . Acute renal failure superimposed on stage 3 chronic kidney disease ([place]) 02/03/2016  . Gastrointestinal dysmotility 01/20/2016  . CKD (chronic kidney disease) stage 3, GFR 30-59 ml/min (H[facility]) 01/20/2016  . Abscess of right thigh 01/20/2016  . Chronic pain syndrome 01/15/2016  . Diarrhea 01/15/2016  .  Generalized abdominal pain 01/14/2016  . Type 2 diabetes with nephropathy ([place]) 12/30/2015  . TIA (transient ischemic attack) 07/2015  . Morbid obesity due to excess calories ([place]) 05/05/2015  . Diabetic polyneuropathy associated with type 2 diabetes mellitus ([place]) 01/02/2015  . Acute bacterial sinusitis 01/02/2015  . Onychomycosis 11/04/2014  . Pain in lower limb 11/04/2014  . Multinodular goiter (nontoxic) 10/16/2014  . Iron deficiency anemia 05/20/2014  . Major depressive disorder, single episode, moderate ([place]) 05/20/2014  . Dizziness of unknown cause 04/28/2014  . MDD (major depressive disorder), recurrent episode, moderate ([place]) 01/28/2014  . GERD (gastroesophageal reflux disease) 01/02/2014  . Nausea 10/15/2012  . Stomach cancer ([place]) 12/10/2008  . CHRONIC RESPIRATORY FAILURE 01/03/2008  . Hyperlipidemia 06/27/2007  . OBESITY, MORBID 06/27/2007  . SLEEP APNEA, OBSTRUCTIVE 06/27/2007  . Generalized idiopathic epilepsy and epileptic syndromes, not intractable, with status epilepticus ([place]) 06/27/2007  . Asthma 06/27/2007  . OSTEOARTHRITIS  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 06/27/2007    Past Surgical History:  Procedure Laterality Date  . APPENDECTOMY    . CESAREAN SECTION    . COLON SURGERY     colonscopy  . COLONOSCOPY WITH PROPOFOL N/A 06/01/2016   Procedure: COLONOSCOPY WITH PROPOFOL;  Surgeon: Wilford Corner, MD;  [facility];  Service: Endoscopy;  Laterality: N/A;  . CORONARY STENT INTERVENTION N/A 08/29/2018   Procedure: CORONARY STENT INTERVENTION;  Surgeon: Wellington Hampshire, MD;  Location: [facility] CV LAB;  Service: Cardiovascular;  Laterality: N/A;  . CORONARY/GRAFT ACUTE MI REVASCULARIZATION N/A 08/27/2018   Procedure: Coronary/Graft Acute MI Revascularization;  Surgeon: Leonie Man, MD;  Location: [facility] CV LAB;  Service: Cardiovascular;  Laterality: N/A;  . ESOPHAGOGASTRODUODENOSCOPY N/A 10/15/2012   Procedure: ESOPHAGOGASTRODUODENOSCOPY (EGD);   Surgeon: Lear Ng, MD;  Location: [facility];  Service: Endoscopy;  Laterality: N/A;  . ESOPHAGOGASTRODUODENOSCOPY N/A 01/15/2016   Procedure: ESOPHAGOGASTRODUODENOSCOPY (EGD);  Surgeon: Ronald Lobo, MD;  Location: [facility];  Service: Endoscopy;  Laterality: N/A;  . ESOPHAGOGASTRODUODENOSCOPY (EGD) WITH PROPOFOL N/A 09/04/2019   Procedure: ESOPHAGOGASTRODUODENOSCOPY (EGD) WITH PROPOFOL;  Surgeon: Ronald Lobo, MD;  Location: [facility];  Service: Endoscopy;  Laterality: N/A;  possible esophageal dilatation  . HERNIA REPAIR    . LEFT HEART CATH AND CORONARY ANGIOGRAPHY N/A 08/27/2018   Procedure: LEFT HEART CATH AND CORONARY ANGIOGRAPHY;  Surgeon: Leonie Man, MD;  Location: [facility] CV LAB;  Service: Cardiovascular;  Laterality: N/A;  . SHOULDER SURGERY     Left  . TOTAL HIP ARTHROPLASTY     Right  . TOTAL KNEE ARTHROPLASTY     Left     OB History    Gravida  4   Para  4   Term      Preterm      AB      Living  4     SAB      TAB      Ectopic      Multiple      Live Births  Family History  Problem Relation Age of Onset  . Diabetes Brother   . Alzheimer's disease Father 77  . Cancer Mother 95  . Diabetes Maternal Grandmother   . Cancer Maternal Grandfather        lung  . Healthy Child   . Suicidality Neg Hx   . Depression Neg Hx   . Dementia Neg Hx   . Anxiety disorder Neg Hx     Social History   Tobacco Use  . Smoking status: Former Smoker    Packs/day: 1.00    Years: 15.00    Pack years: 15.00    Types: Cigarettes    Start date: 09/27/1973    Quit date: 10/15/1988    Years since quitting: 31.0  . Smokeless tobacco: Never Used  . Tobacco comment: quit smoking 14 years ago  Substance Use Topics  . Alcohol use: No    Alcohol/week: 0.0 standard drinks  . Drug use: No    Home Medications Prior to Admission medications   Medication Sig Start Date End Date Taking? Authorizing Provider  acetaminophen  (TYLENOL) 500 MG tablet Take 2 tablets (1,000 mg total) by mouth every 8 (eight) hours. Patient taking differently: Take 1,000 mg by mouth every 8 (eight) hours as needed for moderate pain.  09/27/19 11/26/19 Yes Mercy Riding, MD  albuterol (PROVENTIL HFA;VENTOLIN HFA) 108 (90 Base) MCG/ACT inhaler Inhale 2 puffs into the lungs every 6 (six) hours as needed for wheezing or shortness of breath. 11/05/17  Yes Emokpae, Courage, MD  albuterol (PROVENTIL) (2.5 MG/3ML) 0.083% nebulizer solution Take 3 mLs (2.5 mg total) by nebulization every 6 (six) hours as needed for wheezing or shortness of breath. 11/05/17  Yes Emokpae, Courage, MD  apixaban (ELIQUIS) 5 MG TABS tablet Take 1 tablet (5 mg total) by mouth 2 (two) times daily. 08/19/19  Yes Bonnielee Haff, MD  ciprofloxacin (CIPRO) 500 MG tablet Take 500 mg by mouth 2 (two) times daily. Start 10/18/2019 for 7 days   Yes [provider]  gabapentin (NEURONTIN) 300 MG capsule Take 300 mg by mouth See admin instructions. 300 mg in the morning, 600 mg at bedtime 09/27/19  Yes [provider]  lidocaine-prilocaine (EMLA) cream Apply 1 application topically as needed. Patient taking differently: Apply 1 application topically as needed (to numb port site.).  02/28/19  Yes Volanda Napoleon, MD  metFORMIN (GLUCOPHAGE) 500 MG tablet Take 1 tablet (500 mg total) by mouth 2 (two) times daily with a meal. 02/13/19  Yes Philemon Kingdom, MD  metoCLOPramide (REGLAN) 5 MG tablet Take 1 tablet (5 mg total) by mouth every 8 (eight) hours as needed for nausea (before meals as needed for nausea.). 08/23/19  Yes Arrien, Jimmy Picket, MD  metoprolol tartrate (LOPRESSOR) 25 MG tablet Take 0.5 tablets (12.5 mg total) by mouth 2 (two) times daily. 08/14/19  Yes Swayze, Ava, DO  midodrine (PROAMATINE) 10 MG tablet Take 1 tablet (10 mg total) by mouth 3 (three) times daily with meals. 09/27/19  Yes Mercy Riding, MD  Multiple Vitamins-Minerals (CENTRUM SILVER ADULT 50+) TABS  Take 1 tablet once a day Patient taking differently: Take 1 tablet by mouth daily. Take 1 tablet once a day 02/20/19  Yes Lowne Lyndal Pulley R, DO  nitroGLYCERIN (NITROSTAT) 0.4 MG SL tablet Place 1 tablet (0.4 mg total) under the tongue every 5 (five) minutes as needed. Patient taking differently: Place 0.4 mg under the tongue every 5 (five) minutes as needed for chest pain.  01/14/19  Yes Lorretta Harp, MD  nystatin (MYCOSTATIN/NYSTOP) powder Apply topically 3 (three) times daily. 08/14/19  Yes Swayze, Ava, DO  ondansetron (ZOFRAN) 4 MG tablet Take 1 tablet (4 mg total) by mouth every 6 (six) hours as needed for nausea. 09/27/19  Yes Mercy Riding, MD  oxybutynin (DITROPAN-XL) 5 MG 24 hr tablet Take 5 mg by mouth daily.   Yes [provider]  pantoprazole (PROTONIX) 40 MG tablet Take 1 tablet (40 mg total) by mouth daily. 07/16/19  Yes Roma Schanz R, DO  rosuvastatin (CRESTOR) 20 MG tablet Take 1 tablet (20 mg total) by mouth daily at 6 PM. 08/14/19  Yes Swayze, Ava, DO  sertraline (ZOLOFT) 50 MG tablet Take 1 tablet (50 mg total) by mouth daily. 02/20/19  Yes Ann Held, DO  traMADol (ULTRAM) 50 MG tablet Take 50 mg by mouth every 8 (eight) hours as needed for moderate pain.   Yes [provider]  traZODone (DESYREL) 50 MG tablet Take 0.5 tablets (25 mg total) by mouth at bedtime as needed for sleep. 08/14/19  Yes Swayze, Ava, DO  triamcinolone ointment (KENALOG) 0.1 % Apply 1 application topically 2 (two) times daily. For 14 days beginning 10/08/2019   Yes [provider]  feeding supplement, GLUCERNA SHAKE, (GLUCERNA SHAKE) LIQD Take 237 mLs by mouth 3 (three) times daily between meals. Patient not taking: Reported on 10/22/2019 08/14/19   Swayze, Ava, DO  gabapentin (NEURONTIN) 100 MG capsule Take 6 capsules (600 mg total) by mouth at bedtime AND 3 capsules (300 mg total) every morning. Patient not taking: Reported on 10/22/2019 09/27/19   Mercy Riding, MD    glucose blood (ONETOUCH VERIO) test strip Use as instructed to check blood sugar 3 times a day Patient taking differently: 1 each by Other route See admin instructions. Use as instructed to check blood sugar 3 times a day 09/04/18   Philemon Kingdom, MD    Allergies    Cephalexin, Adhesive [tape], Codeine, and Zocor [simvastatin - high dose]  Review of Systems   Review of Systems  Gastrointestinal: Positive for abdominal pain.   All other systems reviewed and are negative except that which was mentioned in HPI  Physical Exam Updated Vital Signs BP 90/69   Pulse 93   Temp 97.8 F (36.6 C) (Oral)   Resp 17   Ht 5' 6"  (1.676 m)   SpO2 98%   BMI 34.87 kg/m   Physical Exam Vitals and nursing note reviewed.  Constitutional:      General: She is not in acute distress.    Appearance: She is well-developed.  HENT:     Head: Normocephalic and atraumatic.  Eyes:     Conjunctiva/sclera: Conjunctivae normal.     Pupils: Pupils are equal, round, and reactive to light.  Cardiovascular:     Rate and Rhythm: Normal rate and regular rhythm.     Heart sounds: Normal heart sounds. No murmur.  Pulmonary:     Effort: Pulmonary effort is normal.     Breath sounds: Normal breath sounds.  Abdominal:     General: Bowel sounds are normal. There is no distension.     Palpations: Abdomen is soft.     Tenderness: There is abdominal tenderness in the right upper quadrant and left upper quadrant. There is no guarding or rebound.  Musculoskeletal:     Cervical back: Neck supple.     Comments: Mild BLE edema  Skin:  General: Skin is warm and dry.  Neurological:     Mental Status: She is alert and oriented to person, place, and time.     Comments: Fluent speech  Psychiatric:        Judgment: Judgment normal.     ED Results / Procedures / Treatments   Labs (all labs ordered are listed, but only abnormal results are displayed) Labs Reviewed  COMPREHENSIVE METABOLIC PANEL - Abnormal;  Notable for the following components:      Result Value   Potassium 5.2 (*)    Glucose, Bld 111 (*)    Creatinine, Ser 1.87 (*)    Calcium 7.6 (*)    Total Protein 5.0 (*)    Albumin 1.6 (*)    Alkaline Phosphatase 140 (*)    GFR calc non Af Amer 27 (*)    GFR calc Af Amer 32 (*)    All other components within normal limits  CBC - Abnormal; Notable for the following components:   WBC 11.7 (*)    RBC 3.05 (*)    Hemoglobin 9.3 (*)    HCT 26.6 (*)    RDW 16.2 (*)    nRBC 0.5 (*)    All other components within normal limits  URINALYSIS, ROUTINE W REFLEX MICROSCOPIC - Abnormal; Notable for the following components:   APPearance HAZY (*)    Hgb urine dipstick SMALL (*)    Leukocytes,Ua LARGE (*)    Bacteria, UA MANY (*)    Non Squamous Epithelial 0-5 (*)    All other components within normal limits  URINE CULTURE  LIPASE, BLOOD    EKG None  Radiology CT ABDOMEN PELVIS WO CONTRAST  Result Date: 10/22/2019 CLINICAL DATA:  Acute nonlocalized abdominal pain EXAM: CT ABDOMEN AND PELVIS WITHOUT CONTRAST TECHNIQUE: Multidetector CT imaging of the abdomen and pelvis was performed following the standard protocol without IV contrast. COMPARISON:  09/20/2019, 08/08/2019 FINDINGS: Lower chest: Patchy bilateral lower lobe ground-glass airspace disease demonstrates minimal improvement. There is now a small left pleural effusion. Hepatobiliary: Stable severe fatty infiltration of the liver. No focal abnormality on this unenhanced exam. The gallbladder is distended without evidence of cholelithiasis or cholecystitis. Pancreas: Diffuse atrophy, stable.  No focal abnormality. Spleen: Normal in size without focal abnormality. Adrenals/Urinary Tract: No urinary tract calculi or obstructive uropathy. Mild left renal atrophy unchanged. Bladder is unremarkable. The adrenals are normal. Stomach/Bowel: There is mild diffuse colonic wall thickening which may be inflammatory or infectious. No bowel obstruction  or ileus. 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 Vascular/Lymphatic: Aortic atherosclerosis. No enlarged abdominal or pelvic lymph nodes. Reproductive: Uterus and bilateral adnexa are unremarkable. Other: There is a small amount of free fluid within the pelvis, nonspecific. No free gas. Postsurgical changes are seen from previous midline laparotomy. Body wall edema is seen throughout the lower abdomen and pelvis. Musculoskeletal: Right hip arthroplasty is identified. No acute or destructive bony lesions. Reconstructed images demonstrate no additional findings. IMPRESSION: 1. Mild diffuse colonic wall thickening which may reflect inflammatory or infectious colitis. 2. Persistent bibasilar ground-glass airspace disease compatible with multifocal pneumonia. New small left effusion. 3. Small amount of free fluid within the pelvis, nonspecific. 4. Diffuse fatty infiltration of the liver. Electronically Signed   By: Randa Ngo M.D.   On: 10/22/2019 21:24    Procedures Procedures (including critical care time)  Medications Ordered in ED Medications  sodium chloride flush (NS) 0.9 % injection 3 mL (3 mLs Intravenous Given 10/22/19 2012)  sodium  chloride 0.9 % bolus 1,000 mL (0 mLs Intravenous Stopped 10/22/19 2112)    ED Course  I have reviewed the triage vital signs and the nursing notes.  Pertinent labs & imaging results that were available during my care of the patient were reviewed by me and considered in my medical decision making (see chart for details).    MDM Rules/Calculators/A&P                      Comfortable on exam, initial BP in triage low however BP elevated when pt brought back to ED room. Unclear whether initial value was accurate. Remainder of VS stable. Very mild LUQ, RUQ tenderness. Because of surgical hx, obtained CT abd/pelvis to evaluate for partial bowel obstruction or cancer recurrence.  Labs show AKI with creatinine 1.87 likely secondary to  dehydration, potassium 5.2, normal LFTs and lipase, WBC 11.7, hemoglobin stable at 9.3. CT shows mild wall thickening, persistent lung changes likely 2/2 recent COVID-19 infection. UA concerning for infection. Recent culture shows ESBL. Discussed w/ ED pharmacist who recommended Zosyn after reviewing chart. Discussed admission w/ [facility], Dr. Alcario Drought. Final Clinical Impression(s) / ED Diagnoses Final diagnoses:  UTI due to extended-spectrum beta lactamase (ESBL) producing Escherichia coli  AKI (acute kidney injury) [facility])    Rx / [place] Orders ED Discharge Orders    None       Kalkidan Caudell, Wenda Overland, MD 10/22/19 2355

## 2019-10-22 NOTE — ED Triage Notes (Signed)
Pt here via EMS from [facility] for nausea, abdominal pain x 4 days. Hx of chronic nausea, stomach cancer (in remission).

## 2019-10-22 NOTE — ED Notes (Signed)
[age], in triage   Dorothye Berni daughter [phone] would like updates

## 2019-10-23 ENCOUNTER — Telehealth: Payer: Self-pay

## 2019-10-23 DIAGNOSIS — B9629 Other Escherichia coli [E. coli] as the cause of diseases classified elsewhere: Secondary | ICD-10-CM

## 2019-10-23 DIAGNOSIS — K529 Noninfective gastroenteritis and colitis, unspecified: Secondary | ICD-10-CM

## 2019-10-23 DIAGNOSIS — E861 Hypovolemia: Secondary | ICD-10-CM

## 2019-10-23 DIAGNOSIS — Z86718 Personal history of other venous thrombosis and embolism: Secondary | ICD-10-CM

## 2019-10-23 DIAGNOSIS — N179 Acute kidney failure, unspecified: Secondary | ICD-10-CM

## 2019-10-23 DIAGNOSIS — E1169 Type 2 diabetes mellitus with other specified complication: Secondary | ICD-10-CM

## 2019-10-23 DIAGNOSIS — N390 Urinary tract infection, site not specified: Principal | ICD-10-CM

## 2019-10-23 DIAGNOSIS — E1142 Type 2 diabetes mellitus with diabetic polyneuropathy: Secondary | ICD-10-CM

## 2019-10-23 DIAGNOSIS — E785 Hyperlipidemia, unspecified: Secondary | ICD-10-CM

## 2019-10-23 DIAGNOSIS — I9589 Other hypotension: Secondary | ICD-10-CM

## 2019-10-23 DIAGNOSIS — Z1612 Extended spectrum beta lactamase (ESBL) resistance: Secondary | ICD-10-CM

## 2019-10-23 LAB — GLUCOSE, CAPILLARY
Glucose-Capillary: 75 mg/dL (ref 70–99)
Glucose-Capillary: 59 mg/dL — ABNORMAL LOW (ref 70–99)
Glucose-Capillary: 93 mg/dL (ref 70–99)
Glucose-Capillary: 84 mg/dL (ref 70–99)
Glucose-Capillary: 104 mg/dL — ABNORMAL HIGH (ref 70–99)
Glucose-Capillary: 82 mg/dL (ref 70–99)

## 2019-10-23 LAB — BASIC METABOLIC PANEL
Anion gap: 9 (ref 5–15)
BUN: 15 mg/dL (ref 8–23)
CO2: 22 mmol/L (ref 22–32)
Calcium: 7.3 mg/dL — ABNORMAL LOW (ref 8.9–10.3)
Chloride: 109 mmol/L (ref 98–111)
Creatinine, Ser: 1.71 mg/dL — ABNORMAL HIGH (ref 0.44–1.00)
GFR calc Af Amer: 35 mL/min — ABNORMAL LOW (ref >60)
GFR calc non Af Amer: 30 mL/min — ABNORMAL LOW (ref >60)
Glucose, Bld: 83 mg/dL (ref 70–99)
Potassium: 5.4 mmol/L — ABNORMAL HIGH (ref 3.5–5.1)
Sodium: 140 mmol/L (ref 135–145)

## 2019-10-23 LAB — CBC
HCT: 25.1 % — ABNORMAL LOW (ref 36.0–46.0)
Hemoglobin: 8.7 g/dL — ABNORMAL LOW (ref 12.0–15.0)
MCH: 30.4 pg (ref 26.0–34.0)
MCHC: 34.7 g/dL (ref 30.0–36.0)
MCV: 87.8 fL (ref 80.0–100.0)
Platelets: 216 10*3/uL (ref 150–400)
RBC: 2.86 MIL/uL — ABNORMAL LOW (ref 3.87–5.11)
RDW: 16.5 % — ABNORMAL HIGH (ref 11.5–15.5)
WBC: 10.8 10*3/uL — ABNORMAL HIGH (ref 4.0–10.5)
nRBC: 0.4 % — ABNORMAL HIGH (ref 0.0–0.2)

## 2019-10-23 LAB — LACTIC ACID, PLASMA
Lactic Acid, Venous: 1.4 mmol/L (ref 0.5–1.9)
Lactic Acid, Venous: 1.3 mmol/L (ref 0.5–1.9)

## 2019-10-23 LAB — SARS CORONAVIRUS 2 (TAT 6-24 HRS): SARS Coronavirus 2: NEGATIVE

## 2019-10-23 LAB — CBG MONITORING, ED: Glucose-Capillary: 77 mg/dL (ref 70–99)

## 2019-10-23 MED ORDER — SODIUM CHLORIDE 0.9 % IV SOLN: INTRAVENOUS | Status: AC

## 2019-10-23 MED ORDER — SODIUM CHLORIDE 0.9 % IV SOLN
1.0000 g | Freq: Two times a day (BID) | INTRAVENOUS | Status: DC
  Administered 2019-10-23 – 2019-10-27 (×9): 1 g via INTRAVENOUS
  Filled 2019-10-23 (×12): qty 1

## 2019-10-23 MED ORDER — SODIUM CHLORIDE 0.9 % IV SOLN
1.0000 g | Freq: Once | INTRAVENOUS | Status: DC
  Filled 2019-10-23: qty 1

## 2019-10-23 MED ORDER — GABAPENTIN 300 MG PO CAPS
300.0000 mg | ORAL_CAPSULE | Freq: Every day | ORAL | Status: DC
  Administered 2019-10-23 – 2019-10-30 (×8): 300 mg via ORAL
  Filled 2019-10-23 (×6): qty 1
  Filled 2019-10-23: qty 3
  Filled 2019-10-23 (×2): qty 1

## 2019-10-23 MED ORDER — CHLORHEXIDINE GLUCONATE CLOTH 2 % EX PADS
6.0000 | MEDICATED_PAD | Freq: Every day | CUTANEOUS | Status: DC
  Administered 2019-10-23 – 2019-10-30 (×8): 6 via TOPICAL

## 2019-10-23 MED ORDER — FAMOTIDINE 20 MG PO TABS
10.0000 mg | ORAL_TABLET | Freq: Every day | ORAL | Status: DC
  Administered 2019-10-23 – 2019-10-30 (×8): 10 mg via ORAL
  Filled 2019-10-23 (×8): qty 1

## 2019-10-23 MED ORDER — SODIUM CHLORIDE 0.9 % IV SOLN: INTRAVENOUS | Status: DC

## 2019-10-23 MED ORDER — SODIUM CHLORIDE 0.9% FLUSH
10.0000 mL | INTRAVENOUS | Status: DC | PRN
  Administered 2019-10-25 – 2019-10-26 (×2): 10 mL

## 2019-10-23 MED ORDER — GABAPENTIN 300 MG PO CAPS
600.0000 mg | ORAL_CAPSULE | Freq: Every day | ORAL | Status: DC
  Administered 2019-10-23 – 2019-10-30 (×9): 600 mg via ORAL
  Filled 2019-10-23 (×9): qty 2

## 2019-10-23 MED ORDER — SODIUM CHLORIDE 0.9% FLUSH
10.0000 mL | INTRAVENOUS | Status: DC | PRN
  Administered 2019-10-30: 10 mL

## 2019-10-23 MED ORDER — SODIUM CHLORIDE 0.9 % IV BOLUS
500.0000 mL | Freq: Once | INTRAVENOUS | Status: AC
  Administered 2019-10-23: 500 mL via INTRAVENOUS

## 2019-10-23 MED ORDER — LIDOCAINE-PRILOCAINE 2.5-2.5 % EX CREA
TOPICAL_CREAM | CUTANEOUS | Status: DC | PRN
  Filled 2019-10-23: qty 5

## 2019-10-23 NOTE — Plan of Care (Signed)
  Problem: Education: Goal: Knowledge of General Education information will improve Description Including pain rating scale, medication(s)/side effects and non-pharmacologic comfort measures Outcome: Progressing   

## 2019-10-23 NOTE — Telephone Encounter (Signed)
Pt's daughter called to notify Dr Marin Olp that pt is admitted again to [facility] with UTI and abd pain. MD notified. dph

## 2019-10-23 NOTE — Progress Notes (Signed)
[facility]  PROGRESS NOTE  [name] LXB:[id number] DOB: [birth year] DOA: 10/22/2019 PCP: Ann Held, DO Admit date - 10/22/2019   Admitting Physician Etta Quill, DO  Outpatient Primary MD for the patient is Carollee Herter, Alferd Apa, DO  LOS - 1 Brief Narrative   [name] is a 69 y.o. year old female with medical history significant for stomach CA in remission, DM2, hypotension on midodrine, DVT on Eliquis who presented on 10/22/2019 with worsening abdominal pain was admitted with working diagnosis of UTI and concern for infectious colitis currently on IV meropenem.  Notable history for recent hospitalization from 2/23-3/5 for septic shock requiring admission to ICU from ESBL E. coli UTI and possible aspiration pneumonia requiring pressors    Subjective  Today reports slight improvement in in abdominal pain but overall continues to persist with occasional nausea.  Has no interest in eating  A & P    Mild, diffuse colitis, concern for an infectious or inflammatory.  Given recent history of very severe UTI infection presume likely infectious insult needs to be ruled out -Continue IV meropenem -Blood cultures as needed -IV antiemetics -GIP pathogen panel, C. difficile if diarrhea starts  AKI on CKD stage III, likely multifactorial related to infectious etiology, diminished p.o. intake related to above.  Baseline creatinine ~ 1-1.1, 1.87 on admission. -Continue timed IV fluids, repeat BMP in a.m.  Hyperkalemia, mild  History of ESBL E. coli UTI.  Completed previous antibiotic course.  UA here positive however patient denies any urinary symptoms.  He with following urine culture  Type 2 diabetes with peripheral neuropathy.  FBG this a.m. around 60. 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  EGD on 08/2019 -Continue PPI   Remaining per H&P written on 3/31  Family Communication  : None  Code Status : Full  Disposition Plan  :  Patient is from home. Anticipated d/c date: 2 to 3 days. Barriers to d/c or necessity for inpatient status:  No longer requiring IV antibiotics, able to tolerate diet, improvement in AKI Consults  : None  Procedures  :   DVT Prophylaxis  : Eliquis  Lab Results  Component Value Date   PLT 216 10/23/2019    Diet :  Diet Order            Diet Carb Modified Fluid consistency: Thin; Room service appropriate? Yes  Diet effective now               Inpatient Medications Scheduled Meds: . apixaban  5 mg Oral BID  . Chlorhexidine Gluconate Cloth  6 each Topical Daily  . gabapentin  300 mg Oral Daily   And  . gabapentin  600 mg Oral QHS  . insulin aspart  0-15 Units Subcutaneous TID WC  . insulin aspart  0-5 Units Subcutaneous QHS  . metoprolol tartrate  12.5 mg Oral BID  . midodrine  10 mg Oral TID WC  . multivitamin with minerals  1 tablet Oral Daily  . nystatin   Topical TID  . oxybutynin  5 mg Oral Daily  . pantoprazole  40 mg Oral Daily  . rosuvastatin  20 mg Oral q1800  . sertraline  50 mg Oral Daily   Continuous Infusions: . sodium chloride 75 mL/hr at 10/23/19 0437  .  meropenem (MERREM) IV Stopped (10/23/19 0304)   PRN Meds:.acetaminophen **OR** acetaminophen, albuterol, metoCLOPramide, ondansetron **OR** ondansetron (ZOFRAN) IV, sodium chloride flush, traMADol, traZODone  Antibiotics  :   Anti-infectives (From admission, onward)   Start     Dose/Rate Route Frequency Ordered Stop   10/23/19 0200  meropenem (MERREM) 1 g in sodium chloride 0.9 % 100 mL IVPB     1 g 200 mL/hr over 30 Minutes Intravenous Every 12 hours 10/23/19 0045     10/23/19 0030  meropenem (MERREM) 1 g in sodium chloride 0.9 % 100 mL IVPB  Status:  Discontinued     1 g 200 mL/hr over 30 Minutes Intravenous   Once 10/23/19 0015 10/23/19 0044   10/22/19 2315  piperacillin-tazobactam (ZOSYN) IVPB 3.375 g  Status:  Discontinued     3.375 g 100 mL/hr over 30 Minutes Intravenous  Once 10/22/19 2307 10/22/19 2340       Objective   Vitals:   10/23/19 0030 10/23/19 0144 10/23/19 0252 10/23/19 0700  BP: (!) 97/58 (!) 90/58 (!) 130/117 91/75  Pulse: 97 90 (!) 105 77  Resp: 17 17 18 18   Temp:   97.7 F (36.5 C) (!) 97.5 F (36.4 C)  TempSrc:   Oral Oral  SpO2: 94% 97% 100% 94%  Weight: 94.8 kg  91.9 kg   Height:   5' 6"  (1.676 m)     SpO2: 94 %  Wt Readings from Last 3 Encounters:  10/23/19 91.9 kg  09/27/19 98 kg  09/05/19 94.1 kg     Intake/Output Summary (Last 24 hours) at 10/23/2019 0736 Last data filed at 10/23/2019 0554 Gross per 24 hour  Intake 1194.91 ml  Output 0 ml  Net 1194.91 ml    Physical Exam:     Awake Alert, Oriented X 3, Normal affect No new F.N deficits,  [place].AT, Normal respiratory effort on room air, CTAB RRR,No Gallops,Rubs or new Murmurs,  Decreased bowel sounds, generalized tenderness without rebound tenderness or guarding No peripheral edema   I have personally reviewed the following:   Data Reviewed:  CBC Recent Labs  Lab 10/22/19 1756 10/23/19 0315  WBC 11.7* 10.8*  HGB 9.3* 8.7*  HCT 26.6* 25.1*  PLT 232 216  MCV 87.2 87.8  MCH 30.5 30.4  MCHC 35.0 34.7  RDW 16.2* 16.5*    Chemistries  Recent Labs  Lab 10/22/19 1756 10/23/19 0315  NA 136 140  K 5.2* 5.4*  CL 102 109  CO2 23 22  GLUCOSE 111* 83  BUN 16 15  CREATININE 1.87* 1.71*  CALCIUM 7.6* 7.3*  AST 32  --   ALT 29  --   ALKPHOS 140*  --   BILITOT 1.2  --    ------------------------------------------------------------------------------------------------------------------ No results for input(s): CHOL, HDL, LDLCALC, TRIG, CHOLHDL, LDLDIRECT in the last 72 hours.  Lab Results  Component Value Date   HGBA1C 5.6 09/27/2019    ------------------------------------------------------------------------------------------------------------------ No results for input(s): TSH, T4TOTAL, T3FREE, THYROIDAB in the last 72 hours.  Invalid input(s): FREET3 ------------------------------------------------------------------------------------------------------------------ No results for input(s): VITAMINB12, FOLATE, FERRITIN, TIBC, IRON, RETICCTPCT in the last 72 hours.  Coagulation profile No results for input(s): INR, PROTIME in the last 168 hours.  No results for input(s): DDIMER in the last 72 hours.  Cardiac Enzymes No results for input(s): CKMB, TROPONINI, MYOGLOBIN in the last 168 hours.  Invalid input(s): CK ------------------------------------------------------------------------------------------------------------------    Component Value Date/Time   BNP 138.0 (H) 09/17/2019 1725    Micro Results No results  found for this or any previous visit (from the past 240 hour(s)).  Radiology Reports CT ABDOMEN PELVIS WO CONTRAST  Result Date: 10/22/2019 CLINICAL DATA:  Acute nonlocalized abdominal pain EXAM: CT ABDOMEN AND PELVIS WITHOUT CONTRAST TECHNIQUE: Multidetector CT imaging of the abdomen and pelvis was performed following the standard protocol without IV contrast. COMPARISON:  09/20/2019, 08/08/2019 FINDINGS: Lower chest: Patchy bilateral lower lobe ground-glass airspace disease demonstrates minimal improvement. There is now a small left pleural effusion. Hepatobiliary: Stable severe fatty infiltration of the liver. No focal abnormality on this unenhanced exam. The gallbladder is distended without evidence of cholelithiasis or cholecystitis. Pancreas: Diffuse atrophy, stable.  No focal abnormality. Spleen: Normal in size without focal abnormality. Adrenals/Urinary Tract: No urinary tract calculi or obstructive uropathy. Mild left renal atrophy unchanged. Bladder is unremarkable. The adrenals are normal.  Stomach/Bowel: There is mild diffuse colonic wall thickening which may be inflammatory or infectious. No bowel obstruction or ileus. 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 Vascular/Lymphatic: Aortic atherosclerosis. No enlarged abdominal or pelvic lymph nodes. Reproductive: Uterus and bilateral adnexa are unremarkable. Other: There is a small amount of free fluid within the pelvis, nonspecific. No free gas. Postsurgical changes are seen from previous midline laparotomy. Body wall edema is seen throughout the lower abdomen and pelvis. Musculoskeletal: Right hip arthroplasty is identified. No acute or destructive bony lesions. Reconstructed images demonstrate no additional findings. IMPRESSION: 1. Mild diffuse colonic wall thickening which may reflect inflammatory or infectious colitis. 2. Persistent bibasilar ground-glass airspace disease compatible with multifocal pneumonia. New small left effusion. 3. Small amount of free fluid within the pelvis, nonspecific. 4. Diffuse fatty infiltration of the liver. Electronically Signed   By: Randa Ngo M.D.   On: 10/22/2019 21:24   CT HEAD WO CONTRAST  Result Date: 09/24/2019 CLINICAL DATA:  Subacute neuro deficit EXAM: CT HEAD WITHOUT CONTRAST TECHNIQUE: Contiguous axial images were obtained from the base of the skull through the vertex without intravenous contrast. COMPARISON:  CT head 09/17/2019 FINDINGS: Brain: No evidence of acute infarction, hemorrhage, hydrocephalus, extra-axial collection or mass lesion/mass effect. Vascular: Negative for hyperdense vessel Skull: Negative Sinuses/Orbits: Negative Other: None IMPRESSION: Negative CT head Electronically Signed   By: Franchot Gallo M.D.   On: 09/24/2019 13:33   MR BRAIN WO CONTRAST  Result Date: 09/25/2019 CLINICAL DATA:  Vision loss, binocular. EXAM: MRI HEAD WITHOUT CONTRAST TECHNIQUE: Multiplanar, multiecho pulse sequences of the brain and surrounding structures were obtained without  intravenous contrast. COMPARISON:  Head CT September 24, 2019 FINDINGS: Brain: No acute infarction, hemorrhage, hydrocephalus, extra-axial collection or mass lesion. 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 A few scattered foci of T2 hyperintensity are seen within the white matter of the cerebral hemispheres, nonspecific, most likely related to chronic small vessel disease. Vascular: Normal flow voids. Skull and upper cervical spine: Normal marrow signal. Sinuses/Orbits: Mucosal thickening is noted in sphenoid sinus and ethmoid cells. The orbits are maintained. Other: None. IMPRESSION: 1. No acute intracranial abnormality. 2. 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 3. Mild chronic small vessel disease. Electronically Signed   By: Pedro Earls M.D.   On: 09/25/2019 14:33     Time Spent in minutes  30     Desiree Hane M.D on 10/23/2019 at 7:36 AM  To page go to [email or web address] - password [facility]

## 2019-10-23 NOTE — Consult Note (Signed)
WOC Nurse Consult Note: Patient receiving care in [facility] 5M21. Reason for Consult: "sacral decubitus" Wound type: NO wounds on sacrum, coccyx, or buttocks.  Some SLIGHT MASD in the gluteal fold.  A Purewick is in use, please continue if possible.  Also, BLE had kerlex and ace wraps.  These were removed. NO wounds found on feet or legs. Pressure Injury POA: Yes/No/NA Measurement: Wound bed: Drainage (amount, consistency, odor)  Periwound: Dressing procedure/placement/frequency: Spiral wrap BLE with kerlex and ace wraps from behind toes to below knees.  Change wrapping weekly. Weekly wrap change is the frequency the patient states she normally has this done. Thank you for the consult.  Discussed plan of care with the patient and bedside nurse.  [facility] nurse will not follow at this time.  Please re-consult the [place] team if needed.  Val Riles, RN, MSN, CWOCN, CNS-BC, pager [phone]

## 2019-10-23 NOTE — H&P (Signed)
History and Physical    [name] IOM:[id number] DOB: 1950/12/06 DOA: 10/22/2019  PCP: Ann Held, DO  Patient coming from: [place]  I have personally briefly reviewed patient's old medical records in [place]  Chief Complaint: Nausea, abd pain  HPI: [name] is a 69 y.o. female with medical history significant of stomach CA in remission, DM2, hypotension on midodrine.  Pt with admits for ESBL E.Coli UTIs in Feb and earlier this month.  Had septic shock with hypotension requiring pressors earlier this month.  Patient reports a 3-day history of intermittent, central, nonradiating abdominal pain associated with nausea and decreased appetite.  She reports some constipation, last bowel movement was 4 days ago.  No diarrhea or bloody stools.  No urinary symptoms, fevers, URI symptoms.  She was started on ciprofloxacin on 3/24 for suspected UTI at her nursing facility.   ED Course: UA with large LE, 21-50 WBC.  WBC 11.7k  Creat 1.8 up from 1.1 on DC 3/5.  Mildly hypotensive with BPs in the 84T systolic in triage and again currently.  Given 2L IVF bolus.  CT abd/pelvis shows mild colonic wall thickening suggestive of colitis.   Review of Systems: As per HPI, otherwise all review of systems negative.  Past Medical History:  Diagnosis Date  . Asthma   . Degenerative joint disease   . Diabetes mellitus   . Dyspnea   . GERD (gastroesophageal reflux disease)   . Hyperlipidemia    no longer on medication for this  . Hypertension    patient denies  . Myocardial infarction ([place]) 08/2018  . Neuropathy    bilateral hands and feet  . Sickle cell trait ([place])   . Sleep apnea   . stomach ca dx'd 2010   chemo/xrt comp 05/2009  . TIA (transient ischemic attack) 07/2015    Past Surgical History:  Procedure Laterality Date  . APPENDECTOMY    . CESAREAN SECTION    . COLON SURGERY     colonscopy  . COLONOSCOPY WITH PROPOFOL N/A 06/01/2016   Procedure: COLONOSCOPY WITH PROPOFOL;  Surgeon: Wilford Corner, MD;  [facility];  Service: Endoscopy;  Laterality: N/A;  . CORONARY STENT INTERVENTION N/A 08/29/2018   Procedure: CORONARY STENT INTERVENTION;  Surgeon: Wellington Hampshire, MD;  Location: [place] CV LAB;  Service: Cardiovascular;  Laterality: N/A;  . CORONARY/GRAFT ACUTE MI REVASCULARIZATION N/A 08/27/2018   Procedure: Coronary/Graft Acute MI Revascularization;  Surgeon: Leonie Man, MD;  Location: [place] [facility] LAB;  Service: Cardiovascular;  Laterality: N/A;  . ESOPHAGOGASTRODUODENOSCOPY N/A 10/15/2012   Procedure: ESOPHAGOGASTRODUODENOSCOPY (EGD);  Surgeon: Lear Ng, MD;  Location: [facility] ENDOSCOPY;  Service: Endoscopy;  Laterality: N/A;  . ESOPHAGOGASTRODUODENOSCOPY N/A 01/15/2016   Procedure: ESOPHAGOGASTRODUODENOSCOPY (EGD);  Surgeon: Ronald Lobo, MD;  Location: [facility] ENDOSCOPY;  Service: Endoscopy;  Laterality: N/A;  . ESOPHAGOGASTRODUODENOSCOPY (EGD) WITH PROPOFOL N/A 09/04/2019   Procedure: ESOPHAGOGASTRODUODENOSCOPY (EGD) WITH PROPOFOL;  Surgeon: Ronald Lobo, MD;  Location: [place];  Service: Endoscopy;  Laterality: N/A;  possible esophageal dilatation  . HERNIA REPAIR    . LEFT HEART CATH AND CORONARY ANGIOGRAPHY N/A 08/27/2018   Procedure: LEFT HEART CATH AND CORONARY ANGIOGRAPHY;  Surgeon: Leonie Man, MD;  Location: [place] CV LAB;  Service: Cardiovascular;  Laterality: N/A;  . SHOULDER SURGERY     Left  . TOTAL HIP ARTHROPLASTY     Right  . TOTAL KNEE ARTHROPLASTY     Left  reports that she quit smoking about 31 years ago. Her smoking use included cigarettes. She started smoking about 46 years ago. She has a 15.00 pack-year smoking history. She has never used smokeless tobacco. She reports that she does not drink alcohol or use drugs.  Allergies  Allergen Reactions  . Cephalexin Itching, Rash and Other (See Comments)    Sensation of throat closing. Tolerated pip/tazo,  cefepime, ceftriaxone before   . Adhesive [Tape] Other (See Comments)    Burn Skin  . Codeine Other (See Comments)    paranoid  . Zocor [Simvastatin - High Dose] Other (See Comments)    Muscle spasms    Family History  Problem Relation Age of Onset  . Diabetes Brother   . Alzheimer's disease Father 26  . Cancer Mother 78  . Diabetes Maternal Grandmother   . Cancer Maternal Grandfather        lung  . Healthy Child   . Suicidality Neg Hx   . Depression Neg Hx   . Dementia Neg Hx   . Anxiety disorder Neg Hx      Prior to Admission medications   Medication Sig Start Date End Date Taking? Authorizing Provider  acetaminophen (TYLENOL) 500 MG tablet Take 2 tablets (1,000 mg total) by mouth every 8 (eight) hours. Patient taking differently: Take 1,000 mg by mouth every 8 (eight) hours as needed for moderate pain.  09/27/19 11/26/19 Yes Mercy Riding, MD  albuterol (PROVENTIL HFA;VENTOLIN HFA) 108 (90 Base) MCG/ACT inhaler Inhale 2 puffs into the lungs every 6 (six) hours as needed for wheezing or shortness of breath. 11/05/17  Yes Emokpae, Courage, MD  albuterol (PROVENTIL) (2.5 MG/3ML) 0.083% nebulizer solution Take 3 mLs (2.5 mg total) by nebulization every 6 (six) hours as needed for wheezing or shortness of breath. 11/05/17  Yes Emokpae, Courage, MD  apixaban (ELIQUIS) 5 MG TABS tablet Take 1 tablet (5 mg total) by mouth 2 (two) times daily. 08/19/19  Yes Bonnielee Haff, MD  gabapentin (NEURONTIN) 300 MG capsule Take 300 mg by mouth See admin instructions. 300 mg in the morning, 600 mg at bedtime 09/27/19  Yes [provider]  lidocaine-prilocaine (EMLA) cream Apply 1 application topically as needed. Patient taking differently: Apply 1 application topically as needed (to numb port site.).  02/28/19  Yes Volanda Napoleon, MD  metFORMIN (GLUCOPHAGE) 500 MG tablet Take 1 tablet (500 mg total) by mouth 2 (two) times daily with a meal. 02/13/19  Yes Philemon Kingdom, MD  metoCLOPramide  (REGLAN) 5 MG tablet Take 1 tablet (5 mg total) by mouth every 8 (eight) hours as needed for nausea (before meals as needed for nausea.). 08/23/19  Yes Arrien, Jimmy Picket, MD  metoprolol tartrate (LOPRESSOR) 25 MG tablet Take 0.5 tablets (12.5 mg total) by mouth 2 (two) times daily. 08/14/19  Yes Swayze, Ava, DO  midodrine (PROAMATINE) 10 MG tablet Take 1 tablet (10 mg total) by mouth 3 (three) times daily with meals. 09/27/19  Yes Mercy Riding, MD  Multiple Vitamins-Minerals (CENTRUM SILVER ADULT 50+) TABS Take 1 tablet once a day Patient taking differently: Take 1 tablet by mouth daily. Take 1 tablet once a day 02/20/19  Yes Lowne Lyndal Pulley R, DO  nitroGLYCERIN (NITROSTAT) 0.4 MG SL tablet Place 1 tablet (0.4 mg total) under the tongue every 5 (five) minutes as needed. Patient taking differently: Place 0.4 mg under the tongue every 5 (five) minutes as needed for chest pain.  01/14/19  Yes Lorretta Harp,  MD  nystatin (MYCOSTATIN/NYSTOP) powder Apply topically 3 (three) times daily. 08/14/19  Yes Swayze, Ava, DO  ondansetron (ZOFRAN) 4 MG tablet Take 1 tablet (4 mg total) by mouth every 6 (six) hours as needed for nausea. 09/27/19  Yes Mercy Riding, MD  oxybutynin (DITROPAN-XL) 5 MG 24 hr tablet Take 5 mg by mouth daily.   Yes [provider]  pantoprazole (PROTONIX) 40 MG tablet Take 1 tablet (40 mg total) by mouth daily. 07/16/19  Yes Roma Schanz R, DO  rosuvastatin (CRESTOR) 20 MG tablet Take 1 tablet (20 mg total) by mouth daily at 6 PM. 08/14/19  Yes Swayze, Ava, DO  sertraline (ZOLOFT) 50 MG tablet Take 1 tablet (50 mg total) by mouth daily. 02/20/19  Yes Ann Held, DO  traMADol (ULTRAM) 50 MG tablet Take 50 mg by mouth every 8 (eight) hours as needed for moderate pain.   Yes [provider]  traZODone (DESYREL) 50 MG tablet Take 0.5 tablets (25 mg total) by mouth at bedtime as needed for sleep. 08/14/19  Yes Swayze, Ava, DO  triamcinolone ointment  (KENALOG) 0.1 % Apply 1 application topically 2 (two) times daily. For 14 days beginning 10/08/2019   Yes [provider]  glucose blood (ONETOUCH VERIO) test strip Use as instructed to check blood sugar 3 times a day Patient taking differently: 1 each by Other route See admin instructions. Use as instructed to check blood sugar 3 times a day 09/04/18   Philemon Kingdom, MD    Physical Exam: Vitals:   10/22/19 1748 10/22/19 1946 10/22/19 2234 10/22/19 2335  BP: (!) 85/65 (!) 164/115 102/69 90/69  Pulse: 78 99 95 93  Resp: 18 17 17 17   Temp: 97.8 F (36.6 C)     TempSrc: Oral     SpO2: 100% 94% 96% 98%  Height: 5' 6"  (1.676 m)       Constitutional: NAD, calm, comfortable Eyes: PERRL, lids and conjunctivae normal ENMT: Mucous membranes are moist. Posterior pharynx clear of any exudate or lesions.Normal dentition.  Neck: normal, supple, no masses, no thyromegaly Respiratory: clear to auscultation bilaterally, no wheezing, no crackles. Normal respiratory effort. No accessory muscle use.  Cardiovascular: Regular rate and rhythm, no murmurs / rubs / gallops. No extremity edema. 2+ pedal pulses. No carotid bruits.  Abdomen: Mild TTP Musculoskeletal: no clubbing / cyanosis. No joint deformity upper and lower extremities. Good ROM, no contractures. Normal muscle tone.  Skin: Sacral decubitus present Neurologic: CN 2-12 grossly intact. Sensation intact, DTR normal. Strength 5/5 in all 4.  Psychiatric: Normal judgment and insight. Alert and oriented x 3. Normal mood.    Labs on Admission: I have personally reviewed following labs and imaging studies  CBC: Recent Labs  Lab 10/22/19 1756  WBC 11.7*  HGB 9.3*  HCT 26.6*  MCV 87.2  PLT 245   Basic Metabolic Panel: Recent Labs  Lab 10/22/19 1756  NA 136  K 5.2*  CL 102  CO2 23  GLUCOSE 111*  BUN 16  CREATININE 1.87*  CALCIUM 7.6*   GFR: CrCl cannot be calculated (Unknown ideal weight.). Liver Function Tests: Recent  Labs  Lab 10/22/19 1756  AST 32  ALT 29  ALKPHOS 140*  BILITOT 1.2  PROT 5.0*  ALBUMIN 1.6*   Recent Labs  Lab 10/22/19 1756  LIPASE 11   No results for input(s): AMMONIA in the last 168 hours. Coagulation Profile: No results for input(s): INR, PROTIME in the last 168 hours.  Cardiac Enzymes: No results for input(s): CKTOTAL, CKMB, CKMBINDEX, TROPONINI in the last 168 hours. BNP (last 3 results) No results for input(s): PROBNP in the last 8760 hours. HbA1C: No results for input(s): HGBA1C in the last 72 hours. CBG: No results for input(s): GLUCAP in the last 168 hours. Lipid Profile: No results for input(s): CHOL, HDL, LDLCALC, TRIG, CHOLHDL, LDLDIRECT in the last 72 hours. Thyroid Function Tests: No results for input(s): TSH, T4TOTAL, FREET4, T3FREE, THYROIDAB in the last 72 hours. Anemia Panel: No results for input(s): VITAMINB12, FOLATE, FERRITIN, TIBC, IRON, RETICCTPCT in the last 72 hours. Urine analysis:    Component Value Date/Time   COLORURINE YELLOW 10/22/2019 2200   APPEARANCEUR HAZY (A) 10/22/2019 2200   LABSPEC 1.015 10/22/2019 2200   LABSPEC 1.015 04/06/2015 0939   PHURINE 5.0 10/22/2019 2200   GLUCOSEU NEGATIVE 10/22/2019 2200   HGBUR SMALL (A) 10/22/2019 2200   BILIRUBINUR NEGATIVE 10/22/2019 2200   BILIRUBINUR 1+ 10/01/2018 1146   KETONESUR NEGATIVE 10/22/2019 2200   PROTEINUR NEGATIVE 10/22/2019 2200   UROBILINOGEN 0.2 10/01/2018 1146   UROBILINOGEN 0.2 04/06/2015 0939   NITRITE NEGATIVE 10/22/2019 2200   LEUKOCYTESUR LARGE (A) 10/22/2019 2200    Radiological Exams on Admission: CT ABDOMEN PELVIS WO CONTRAST  Result Date: 10/22/2019 CLINICAL DATA:  Acute nonlocalized abdominal pain EXAM: CT ABDOMEN AND PELVIS WITHOUT CONTRAST TECHNIQUE: Multidetector CT imaging of the abdomen and pelvis was performed following the standard protocol without IV contrast. COMPARISON:  09/20/2019, 08/08/2019 FINDINGS: Lower chest: Patchy bilateral lower lobe  ground-glass airspace disease demonstrates minimal improvement. There is now a small left pleural effusion. Hepatobiliary: Stable severe fatty infiltration of the liver. No focal abnormality on this unenhanced exam. The gallbladder is distended without evidence of cholelithiasis or cholecystitis. Pancreas: Diffuse atrophy, stable.  No focal abnormality. Spleen: Normal in size without focal abnormality. Adrenals/Urinary Tract: No urinary tract calculi or obstructive uropathy. Mild left renal atrophy unchanged. Bladder is unremarkable. The adrenals are normal. Stomach/Bowel: There is mild diffuse colonic wall thickening which may be inflammatory or infectious. No bowel obstruction or ileus. 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 Vascular/Lymphatic: Aortic atherosclerosis. No enlarged abdominal or pelvic lymph nodes. Reproductive: Uterus and bilateral adnexa are unremarkable. Other: There is a small amount of free fluid within the pelvis, nonspecific. No free gas. Postsurgical changes are seen from previous midline laparotomy. Body wall edema is seen throughout the lower abdomen and pelvis. Musculoskeletal: Right hip arthroplasty is identified. No acute or destructive bony lesions. Reconstructed images demonstrate no additional findings. IMPRESSION: 1. Mild diffuse colonic wall thickening which may reflect inflammatory or infectious colitis. 2. Persistent bibasilar ground-glass airspace disease compatible with multifocal pneumonia. New small left effusion. 3. Small amount of free fluid within the pelvis, nonspecific. 4. Diffuse fatty infiltration of the liver. Electronically Signed   By: Randa Ngo M.D.   On: 10/22/2019 21:24    EKG: Independently reviewed.  Assessment/Plan Principal Problem:   Urinary tract infection due to extended-spectrum beta lactamase (ESBL) producing Escherichia coli Active Problems:   Diabetic polyneuropathy associated with type 2 diabetes mellitus (HCC)    Hypotension   Hyperlipidemia associated with type 2 diabetes mellitus ([place])   Colitis   AKI (acute kidney injury) ([place])   History of DVT (deep vein thrombosis)   UTI due to extended-spectrum beta lactamase (ESBL) producing Escherichia coli    1. UTI - with ESBL last 2 times including earlier this month 1. Treat empirically as ESBL 2. merrem empirically 3. Culture  pending 2. Colitis - 1. ? Enteropathogenic E.Coli again like she had in Oct and Jan? 2. GI pathogen pnl 3. Wondering if the enteropathogenic E.Coli is itself an ESBL... 4. If she develops diarrhea or worsens, test for C.Diff, no h/o CDIFF though. 3. AKI - 1. Due to dehydration from poor PO intake most likely 2. IVF: 2L bolus in ED then 75 cc/hr 3. Intake and output 4. Repeat BMP in AM 4. Hypotension - 1. Chronic 2. Continue midodrine 5. DM2 - 1. Hold home PO hypoglycemics 2. Mod scale SSI AC/HS 6. H/o DVT - 1. Cont eliquis 7. Sacral decubitus - 1. Wound care consult 8. HLD - cont statin  DVT prophylaxis: eliquis Code Status: Full Family Communication: No family in room Disposition Plan: Back to SNF after ESBL and AKI treated Consults called: None Admission status: Admit to inpatient  Severity of Illness: The appropriate patient status for this patient is INPATIENT. Inpatient status is judged to be reasonable and necessary in order to provide the required intensity of service to ensure the patient's safety. The patient's presenting symptoms, physical exam findings, and initial radiographic and laboratory data in the context of their chronic comorbidities is felt to place them at high risk for further clinical deterioration. Furthermore, it is not anticipated that the patient will be medically stable for discharge from the hospital within 2 midnights of admission. The following factors support the patient status of inpatient.   IP status for UTI, likely due to ESBL given history, further complicated by AKI.  * I  certify that at the point of admission it is my clinical judgment that the patient will require inpatient hospital care spanning beyond 2 midnights from the point of admission due to high intensity of service, high risk for further deterioration and high frequency of surveillance required.*    Halona Amstutz M. DO [facility]  How to contact the [facility] Attending or Consulting provider [place] or covering provider during after hours [place], for this patient?  1. Check the care team in [facility] and look for a) attending/consulting [facility] provider listed and b) the [facility] team listed 2. Log into [email or web address]  Amion Physician Scheduling and messaging for groups and whole hospitals  On call and physician scheduling software for group practices, residents, hospitalists and other medical providers for call, clinic, rotation and shift schedules. OnCall [employer] is a hospital-wide system for scheduling doctors and paging doctors on call. EasyPlot is for scientific plotting and data analysis.  [email or web address]  and use [place]'s universal password to access. If you do not have the password, please contact the hospital operator.  3. Locate the [facility] provider you are looking for under [facility] and page to a number that you can be directly reached. 4. If you still have difficulty reaching the provider, please page the [facility] (Director on Call) for the Hospitalists listed on amion for assistance.  10/23/2019, 12:03 AM

## 2019-10-23 NOTE — ED Notes (Signed)
Unable to get nightly gabapentin in ER pyxis.

## 2019-10-23 NOTE — Progress Notes (Signed)
Pharmacy Antibiotic Note  MARIANN PALO is a 69 y.o. female admitted on 10/22/2019 with ? ESBL infection.  Pharmacy has been consulted for meropenem dosing.  Plan: Meropenem 1 gram iv Q 12 hours Follow up progress, Scr, cultures  Height: 5' 6"  (167.6 cm) Weight: 209 lb (94.8 kg) IBW/kg (Calculated) : 59.3  Temp (24hrs), Avg:97.8 F (36.6 C), Min:97.8 F (36.6 C), Max:97.8 F (36.6 C)  Recent Labs  Lab 10/22/19 1756  WBC 11.7*  CREATININE 1.87*    Estimated Creatinine Clearance: 33.9 mL/min (A) (by C-G formula based on SCr of 1.87 mg/dL (H)).    Allergies  Allergen Reactions  . Cephalexin Itching, Rash and Other (See Comments)    Sensation of throat closing. Tolerated pip/tazo, cefepime, ceftriaxone before   . Adhesive [Tape] Other (See Comments)    Burn Skin  . Codeine Other (See Comments)    paranoid  . Zocor [Simvastatin - High Dose] Other (See Comments)    Muscle spasms      Thank you for allowing pharmacy to be a part of this patient's care.  Tad Moore 10/23/2019 12:44 AM

## 2019-10-24 ENCOUNTER — Inpatient Hospital Stay (HOSPITAL_COMMUNITY): Payer: Medicare HMO

## 2019-10-24 DIAGNOSIS — G9341 Metabolic encephalopathy: Secondary | ICD-10-CM

## 2019-10-24 DIAGNOSIS — M7989 Other specified soft tissue disorders: Secondary | ICD-10-CM

## 2019-10-24 DIAGNOSIS — G894 Chronic pain syndrome: Secondary | ICD-10-CM

## 2019-10-24 LAB — BASIC METABOLIC PANEL
Anion gap: 8 (ref 5–15)
BUN: 15 mg/dL (ref 8–23)
CO2: 24 mmol/L (ref 22–32)
Calcium: 6.9 mg/dL — ABNORMAL LOW (ref 8.9–10.3)
Chloride: 112 mmol/L — ABNORMAL HIGH (ref 98–111)
Creatinine, Ser: 1.51 mg/dL — ABNORMAL HIGH (ref 0.44–1.00)
GFR calc Af Amer: 41 mL/min — ABNORMAL LOW (ref >60)
GFR calc non Af Amer: 35 mL/min — ABNORMAL LOW (ref >60)
Glucose, Bld: 78 mg/dL (ref 70–99)
Potassium: 4.3 mmol/L (ref 3.5–5.1)
Sodium: 144 mmol/L (ref 135–145)

## 2019-10-24 LAB — GLUCOSE, CAPILLARY
Glucose-Capillary: 72 mg/dL (ref 70–99)
Glucose-Capillary: 133 mg/dL — ABNORMAL HIGH (ref 70–99)
Glucose-Capillary: 136 mg/dL — ABNORMAL HIGH (ref 70–99)

## 2019-10-24 LAB — MRSA PCR SCREENING: MRSA by PCR: POSITIVE — AB

## 2019-10-24 LAB — CBC
HCT: 23.2 % — ABNORMAL LOW (ref 36.0–46.0)
Hemoglobin: 8.1 g/dL — ABNORMAL LOW (ref 12.0–15.0)
MCH: 30.3 pg (ref 26.0–34.0)
MCHC: 34.9 g/dL (ref 30.0–36.0)
MCV: 86.9 fL (ref 80.0–100.0)
Platelets: 207 10*3/uL (ref 150–400)
RBC: 2.67 MIL/uL — ABNORMAL LOW (ref 3.87–5.11)
RDW: 16.2 % — ABNORMAL HIGH (ref 11.5–15.5)
WBC: 9.4 10*3/uL (ref 4.0–10.5)
nRBC: 0.5 % — ABNORMAL HIGH (ref 0.0–0.2)

## 2019-10-24 LAB — CORTISOL: Cortisol, Plasma: 13.7 ug/dL

## 2019-10-24 MED ORDER — SODIUM CHLORIDE 0.9 % IV SOLN: INTRAVENOUS | Status: AC

## 2019-10-24 MED ORDER — METOCLOPRAMIDE HCL 5 MG PO TABS
5.0000 mg | ORAL_TABLET | Freq: Three times a day (TID) | ORAL | Status: DC
  Administered 2019-10-24 – 2019-10-28 (×16): 5 mg via ORAL
  Filled 2019-10-24 (×16): qty 1

## 2019-10-24 MED ORDER — SODIUM CHLORIDE 0.9 % IV BOLUS
500.0000 mL | Freq: Once | INTRAVENOUS | Status: AC
  Administered 2019-10-24: 500 mL via INTRAVENOUS

## 2019-10-24 NOTE — Plan of Care (Signed)
?  Problem: Coping: ?Goal: Level of anxiety will decrease ?Outcome: Progressing ?  ?Problem: Safety: ?Goal: Ability to remain free from injury will improve ?Outcome: Progressing ?  ?

## 2019-10-24 NOTE — Progress Notes (Signed)
Pt. was given 500cc bolus for BP 84/47mHg. When this RN about to rechecked BP, pt refused adamantly.  pt. state to leave her alone, she wants to sleep. explained to pt. the importance, still refused to have BP taken. After 30 mins, charge nurse Emman attempted to checked Vital signs pt. refused again and getting mad. Will check patient

## 2019-10-24 NOTE — Progress Notes (Signed)
Patient refused lab drawn for blood culture. explained to patient the importance, still refused.

## 2019-10-24 NOTE — Plan of Care (Signed)
  Problem: Activity: Goal: Risk for activity intolerance will decrease Outcome: Progressing   Problem: Nutrition: Goal: Adequate nutrition will be maintained Outcome: Progressing   

## 2019-10-24 NOTE — Plan of Care (Signed)
  Problem: Clinical Measurements: Goal: Ability to maintain clinical measurements within normal limits will improve Outcome: Progressing   

## 2019-10-24 NOTE — Progress Notes (Signed)
Left lower extremity venous duplex exam.  Preliminary results can be found under CV proc under chart review.  10/24/2019 4:17 PM  Karmella Bouvier, K., RDMS, RVT

## 2019-10-24 NOTE — Progress Notes (Signed)
[facility]  PROGRESS NOTE  CLARESSA HUGHLEY MNO:[id number] DOB: 31-Jan-1951 DOA: 10/22/2019 PCP: Ann Held, DO Admit date - 10/22/2019   Admitting Physician Etta Quill, DO  Outpatient Primary MD for the patient is Carollee Herter, Alferd Apa, DO  LOS - 2 Brief Narrative   [name] is a 69 y.o. year old female with medical history significant for stomach CA in remission, DM2, hypotension on midodrine, COVID 19 infection with no need for treatment but acute DVT (07/2019 now on Eliquis, and COVID who presented on 10/22/2019 with worsening abdominal pain was admitted with working diagnosis of UTI and concern for infectious colitis currently on IV meropenem.  Notable history for recent hospitalizations:  1/14-1/22/2021 for generalized weakness and recurrent falls after recent hospitalization for Covid infection periumbilical rash, AKI on CKD due to dehydration/poor appetite/nausea and orthostatic hypotension responsive to IV fluids patient was discharged home, declining SNF recommendations by PT/OT  1/22/--08/20/2019 for further thrive/generalized weakness and physical deconditioning from recent hospitalizations was AKI on CKD related to dehydration that responded to IV fluids.  Patient was discharged to skilled nursing facility at the time  2/8-2/12/21 nausea, vomiting, decreased appetite with concern for esophageal dysmotility and gastric dysmotility given patient's history of stage Ib gastric cancer.  Found to have large amount of gastric fluid retaining and recommended to have Reglan before meals  2/23-3/5 for septic shock requiring admission to ICU from ESBL E. coli UTI and possible aspiration pneumonia requiring pressors.       Subjective  Today states she is ready to try to eat as her abdominal pain feels better.  But notes significant pain in her left leg particularly her calf near area of swelling.  A & P   Mild, diffuse colitis, concern for an infectious etiology or  inflammatory.  Given recent history of very severe UTI infection presume likely infectious.  Feels abdominal pain is better, no nausea, willing to try to eat -Monitor on oral diet, if tolerates will transition to oral antibiotics  -Continue IV meropenem -Blood cultures as needed -As needed Reglan -GIP pathogen panel, C. difficile if diarrhea starts  Acute on Chronic hypotension, stable.  Worse in the setting of likely infection with resultant decreased p.o. intake related to pain. SBP 80-90's throughout hospitalization, lactic acid x2 wnl.  -Continue midodrine -Continue IV fluids until able to tolerate sustained p.o. intake -Check a.m. cortisol  AKI on CKD stage III, likely multifactorial related to infectious etiology, diminished p.o. intake related to above, improving.  Baseline creatinine is wide ranging ~ 1-1.6, 1.87 on admission and currently 1.51 -Continue timed IV fluids, repeat BMP in a.m.  Left lower leg pain and swelling -obtain venous duplex to rule out DVT (has history, and currently on eliquis)  Acute metabolic encephalopathy.  Having bouts of confusion, anxiety, visual hallucinations per daughter.  Patient does seem more anxious on my exam.-Delirium in the setting of being in hospital concurrent infection hypotension as well as metabolic abnormalities -Delirium precautions -Address infection above -Monitor electrolytes -Does not have any focal deficits, continue closely monitor -avoid sedatives  GERD/gastritis with significant esophageal dysmotility without stricture based on recent EGD on 08/2019.  EGD on 08/2019 -GI recommends small boluses of food/bariatric diet -We will schedule her Reglan -Continue PPI  Hyperkalemia, mild.  Peak potassium 5.4, now 4.39 -continue monitoring BMP  History of ESBL E. coli UTI.  Completed previous antibiotic course.  UA here positive however patient denies any urinary symptoms. -Follow urine culture  Type 2 diabetes with peripheral  neuropathy, A1c 5.6.  FBG this a.m. around 70. -Continue to closely monitor on sliding scale as needed -Continue gabapentin for mood  Anemia of chronic disease No signs symptoms of bleeding.  Hemoglobin at baseline -Daily CBC  History of CAD status post PCI stent in 08/2018, asymptomatic -Continue home Lopressor  Recent DVT, diagnosed 08/21/2019 -continue home Eliquis  Chronic pain syndrome -Continue home tramadol      Family Communication  : Updated daughter Lyan Holck [phone]  Code Status : Full  Disposition Plan :  Patient is from home. Anticipated d/c date: 2 to 3 days. Barriers to d/c or necessity for inpatient status:  No longer requiring IV antibiotics, able to tolerate diet, BP stable improvement in AKI Consults  : None  Procedures  : venous duplex  DVT Prophylaxis  : Eliquis  Lab Results  Component Value Date   PLT 207 10/24/2019    Diet :  Diet Order            Diet Carb Modified Fluid consistency: Thin; Room service appropriate? Yes  Diet effective now               Inpatient Medications Scheduled Meds: . apixaban  5 mg Oral BID  . Chlorhexidine Gluconate Cloth  6 each Topical Daily  . famotidine  10 mg Oral Daily  . gabapentin  300 mg Oral Daily   And  . gabapentin  600 mg Oral QHS  . insulin aspart  0-15 Units Subcutaneous TID WC  . insulin aspart  0-5 Units Subcutaneous QHS  . metoprolol tartrate  12.5 mg Oral BID  . midodrine  10 mg Oral TID WC  . multivitamin with minerals  1 tablet Oral Daily  . nystatin   Topical TID  . oxybutynin  5 mg Oral Daily  . pantoprazole  40 mg Oral Daily  . rosuvastatin  20 mg Oral q1800  . sertraline  50 mg Oral Daily   Continuous Infusions: . sodium chloride Stopped (10/24/19 1308)  . meropenem (MERREM) IV 1 g (10/24/19 1308)   PRN Meds:.acetaminophen **OR** acetaminophen, albuterol, lidocaine-prilocaine, metoCLOPramide, ondansetron **OR** ondansetron (ZOFRAN) IV, sodium chloride flush, sodium  chloride flush, traMADol, traZODone  Antibiotics  :   Anti-infectives (From admission, onward)   Start     Dose/Rate Route Frequency Ordered Stop   10/23/19 0200  meropenem (MERREM) 1 g in sodium chloride 0.9 % 100 mL IVPB     1 g 200 mL/hr over 30 Minutes Intravenous Every 12 hours 10/23/19 0045     10/23/19 0030  meropenem (MERREM) 1 g in sodium chloride 0.9 % 100 mL IVPB  Status:  Discontinued     1 g 200 mL/hr over 30 Minutes Intravenous  Once 10/23/19 0015 10/23/19 0044   10/22/19 2315  piperacillin-tazobactam (ZOSYN) IVPB 3.375 g  Status:  Discontinued     3.375 g 100 mL/hr over 30 Minutes Intravenous  Once 10/22/19 2307 10/22/19 2340       Objective   Vitals:   10/24/19 0007 10/24/19 1015 10/24/19 1155 10/24/19 1246  BP: (!) 84/48 113/89 (!) 77/68 (!) 89/57  Pulse:  (!) 145 99 75  Resp:  18 18 18   Temp:  98.3 F (36.8 C)  97.7 F (36.5 C)  TempSrc:  Oral  Oral  SpO2:  99% (!) 76% (!) 84%  Weight:      Height:        SpO2: (!) 84 % O2 Flow  Rate (L/min): 2 L/min  Wt Readings from Last 3 Encounters:  10/23/19 91.9 kg  09/27/19 98 kg  09/05/19 94.1 kg     Intake/Output Summary (Last 24 hours) at 10/24/2019 1506 Last data filed at 10/24/2019 1308 Gross per 24 hour  Intake 2274.64 ml  Output --  Net 2274.64 ml    Physical Exam:     Awake Alert, Oriented X 3, Normal affect No new F.N deficits,  [place].AT, Normal respiratory effort on room air, CTAB RRR,No Gallops,Rubs or new Murmurs,  Decreased bowel sounds, mild tenderness in left lower quadrant without rebound tenderness or guarding 1+ pitting edema of left lower calf   I have personally reviewed the following:   Data Reviewed:  CBC Recent Labs  Lab 10/22/19 1756 10/23/19 0315 10/24/19 0500  WBC 11.7* 10.8* 9.4  HGB 9.3* 8.7* 8.1*  HCT 26.6* 25.1* 23.2*  PLT 232 216 207  MCV 87.2 87.8 86.9  MCH 30.5 30.4 30.3  MCHC 35.0 34.7 34.9  RDW 16.2* 16.5* 16.2*    Chemistries  Recent Labs  Lab  10/22/19 1756 10/23/19 0315 10/24/19 0500  NA 136 140 144  K 5.2* 5.4* 4.3  CL 102 109 112*  CO2 23 22 24   GLUCOSE 111* 83 78  BUN 16 15 15   CREATININE 1.87* 1.71* 1.51*  CALCIUM 7.6* 7.3* 6.9*  AST 32  --   --   ALT 29  --   --   ALKPHOS 140*  --   --   BILITOT 1.2  --   --    ------------------------------------------------------------------------------------------------------------------ No results for input(s): CHOL, HDL, LDLCALC, TRIG, CHOLHDL, LDLDIRECT in the last 72 hours.  Lab Results  Component Value Date   HGBA1C 5.6 09/27/2019   ------------------------------------------------------------------------------------------------------------------ No results for input(s): TSH, T4TOTAL, T3FREE, THYROIDAB in the last 72 hours.  Invalid input(s): FREET3 ------------------------------------------------------------------------------------------------------------------ No results for input(s): VITAMINB12, FOLATE, FERRITIN, TIBC, IRON, RETICCTPCT in the last 72 hours.  Coagulation profile No results for input(s): INR, PROTIME in the last 168 hours.  No results for input(s): DDIMER in the last 72 hours.  Cardiac Enzymes No results for input(s): CKMB, TROPONINI, MYOGLOBIN in the last 168 hours.  Invalid input(s): CK ------------------------------------------------------------------------------------------------------------------    Component Value Date/Time   BNP 138.0 (H) 09/17/2019 1725    Micro Results Recent Results (from the past 240 hour(s))  Urine culture     Status: Abnormal (Preliminary result)   Collection Time: 10/22/19  8:01 PM   Specimen: Urine, Random  Result Value Ref Range Status   Specimen Description URINE, RANDOM  Final   Special Requests NONE  Final   Culture (A)  Final    >=100,000 COLONIES/mL ESCHERICHIA COLI SUSCEPTIBILITIES TO FOLLOW CULTURE REINCUBATED FOR BETTER GROWTH Performed at [facility] [address] [place],  [place] [address]    Report Status PENDING  Incomplete  SARS CORONAVIRUS 2 (TAT 6-24 HRS) Nasopharyngeal Nasopharyngeal Swab     Status: None   Collection Time: 10/23/19  2:19 AM   Specimen: Nasopharyngeal Swab  Result Value Ref Range Status   SARS Coronavirus 2 NEGATIVE NEGATIVE Final    Comment: (NOTE) SARS-CoV-2 target nucleic acids are NOT DETECTED. The SARS-CoV-2 RNA is generally detectable in upper and lower respiratory specimens during the acute phase of infection. Negative results do not preclude SARS-CoV-2 infection, do not rule out co-infections with other pathogens, and should not be used as the sole basis for treatment or other patient management decisions. Negative results must be  combined with clinical observations, patient history, and epidemiological information. The expected result is Negative. Fact Sheet for Patients: SugarRoll.be Fact Sheet for Healthcare Providers: https://www.woods-mathews.com/ This test is not yet approved or cleared by the [place] FDA and  has been authorized for detection and/or diagnosis of SARS-CoV-2 by FDA under an Emergency Use Authorization (EUA). This EUA will remain  in effect (meaning this test can be used) for the duration of the COVID-19 declaration under Section 56 4(b)(1) of the Act, 21 U.S.C. section 360bbb-3(b)(1), unless the authorization is terminated or revoked sooner. Performed at [facility], [place] [address], [place], [place] [address]     Radiology Reports CT ABDOMEN PELVIS WO CONTRAST  Result Date: 10/22/2019 CLINICAL DATA:  Acute nonlocalized abdominal pain EXAM: CT ABDOMEN AND PELVIS WITHOUT CONTRAST TECHNIQUE: Multidetector CT imaging of the abdomen and pelvis was performed following the standard protocol without IV contrast. COMPARISON:  09/20/2019, 08/08/2019 FINDINGS: Lower chest: Patchy bilateral lower lobe ground-glass airspace disease demonstrates minimal improvement.  There is now a small left pleural effusion. Hepatobiliary: Stable severe fatty infiltration of the liver. No focal abnormality on this unenhanced exam. The gallbladder is distended without evidence of cholelithiasis or cholecystitis. Pancreas: Diffuse atrophy, stable.  No focal abnormality. Spleen: Normal in size without focal abnormality. Adrenals/Urinary Tract: No urinary tract calculi or obstructive uropathy. Mild left renal atrophy unchanged. Bladder is unremarkable. The adrenals are normal. Stomach/Bowel: There is mild diffuse colonic wall thickening which may be inflammatory or infectious. No bowel obstruction or ileus. 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 Vascular/Lymphatic: Aortic atherosclerosis. No enlarged abdominal or pelvic lymph nodes. Reproductive: Uterus and bilateral adnexa are unremarkable. Other: There is a small amount of free fluid within the pelvis, nonspecific. No free gas. Postsurgical changes are seen from previous midline laparotomy. Body wall edema is seen throughout the lower abdomen and pelvis. Musculoskeletal: Right hip arthroplasty is identified. No acute or destructive bony lesions. Reconstructed images demonstrate no additional findings. IMPRESSION: 1. Mild diffuse colonic wall thickening which may reflect inflammatory or infectious colitis. 2. Persistent bibasilar ground-glass airspace disease compatible with multifocal pneumonia. New small left effusion. 3. Small amount of free fluid within the pelvis, nonspecific. 4. Diffuse fatty infiltration of the liver. Electronically Signed   By: Randa Ngo M.D.   On: 10/22/2019 21:24   MR BRAIN WO CONTRAST  Result Date: 09/25/2019 CLINICAL DATA:  Vision loss, binocular. EXAM: MRI HEAD WITHOUT CONTRAST TECHNIQUE: Multiplanar, multiecho pulse sequences of the brain and surrounding structures were obtained without intravenous contrast. COMPARISON:  Head CT September 24, 2019 FINDINGS: Brain: No acute infarction,  hemorrhage, hydrocephalus, extra-axial collection or mass lesion. 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 A few scattered foci of T2 hyperintensity are seen within the white matter of the cerebral hemispheres, nonspecific, most likely related to chronic small vessel disease. Vascular: Normal flow voids. Skull and upper cervical spine: Normal marrow signal. Sinuses/Orbits: Mucosal thickening is noted in sphenoid sinus and ethmoid cells. The orbits are maintained. Other: None. IMPRESSION: 1. No acute intracranial abnormality. 2. 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 3. Mild chronic small vessel disease. Electronically Signed   By: Pedro Earls M.D.   On: 09/25/2019 14:33     Time Spent in minutes  30     Desiree Hane M.D on 10/24/2019 at 3:06 PM  To page go to [email or web address] - password [facility]

## 2019-10-25 ENCOUNTER — Inpatient Hospital Stay (HOSPITAL_COMMUNITY): Payer: Medicare HMO

## 2019-10-25 DIAGNOSIS — R0902 Hypoxemia: Secondary | ICD-10-CM

## 2019-10-25 DIAGNOSIS — M25552 Pain in left hip: Secondary | ICD-10-CM

## 2019-10-25 LAB — BASIC METABOLIC PANEL
Anion gap: 10 (ref 5–15)
BUN: 12 mg/dL (ref 8–23)
CO2: 23 mmol/L (ref 22–32)
Calcium: 7 mg/dL — ABNORMAL LOW (ref 8.9–10.3)
Chloride: 109 mmol/L (ref 98–111)
Creatinine, Ser: 1.31 mg/dL — ABNORMAL HIGH (ref 0.44–1.00)
GFR calc Af Amer: 49 mL/min — ABNORMAL LOW (ref >60)
GFR calc non Af Amer: 42 mL/min — ABNORMAL LOW (ref >60)
Glucose, Bld: 137 mg/dL — ABNORMAL HIGH (ref 70–99)
Potassium: 4.4 mmol/L (ref 3.5–5.1)
Sodium: 142 mmol/L (ref 135–145)

## 2019-10-25 LAB — CBC
HCT: 25.4 % — ABNORMAL LOW (ref 36.0–46.0)
Hemoglobin: 9 g/dL — ABNORMAL LOW (ref 12.0–15.0)
MCH: 30.5 pg (ref 26.0–34.0)
MCHC: 35.4 g/dL (ref 30.0–36.0)
MCV: 86.1 fL (ref 80.0–100.0)
Platelets: 217 10*3/uL (ref 150–400)
RBC: 2.95 MIL/uL — ABNORMAL LOW (ref 3.87–5.11)
RDW: 16.3 % — ABNORMAL HIGH (ref 11.5–15.5)
WBC: 11.4 10*3/uL — ABNORMAL HIGH (ref 4.0–10.5)
nRBC: 0.5 % — ABNORMAL HIGH (ref 0.0–0.2)

## 2019-10-25 LAB — GLUCOSE, CAPILLARY
Glucose-Capillary: 129 mg/dL — ABNORMAL HIGH (ref 70–99)
Glucose-Capillary: 100 mg/dL — ABNORMAL HIGH (ref 70–99)
Glucose-Capillary: 114 mg/dL — ABNORMAL HIGH (ref 70–99)
Glucose-Capillary: 139 mg/dL — ABNORMAL HIGH (ref 70–99)

## 2019-10-25 LAB — CORTISOL: Cortisol, Plasma: 13 ug/dL

## 2019-10-25 MED ORDER — DEXTROSE 5 % IV SOLN
250.0000 mg | INTRAVENOUS | Status: DC
  Administered 2019-10-26: 250 mg via INTRAVENOUS
  Filled 2019-10-25 (×2): qty 250

## 2019-10-25 MED ORDER — MUPIROCIN 2 % EX OINT: TOPICAL_OINTMENT | Freq: Two times a day (BID) | CUTANEOUS | Status: DC

## 2019-10-25 MED ORDER — SODIUM CHLORIDE 0.9 % IV SOLN: INTRAVENOUS | Status: AC

## 2019-10-25 MED ORDER — SODIUM CHLORIDE 0.9 % IV SOLN
500.0000 mg | Freq: Once | INTRAVENOUS | Status: AC
  Administered 2019-10-25: 500 mg via INTRAVENOUS
  Filled 2019-10-25: qty 500

## 2019-10-25 MED ORDER — VANCOMYCIN HCL 1250 MG/250ML IV SOLN
1250.0000 mg | INTRAVENOUS | Status: DC
  Administered 2019-10-25 – 2019-10-27 (×3): 1250 mg via INTRAVENOUS
  Filled 2019-10-25 (×4): qty 250

## 2019-10-25 MED ORDER — MUPIROCIN 2 % EX OINT
TOPICAL_OINTMENT | CUTANEOUS | Status: AC
  Filled 2019-10-25: qty 22

## 2019-10-25 NOTE — Plan of Care (Signed)
  Problem: Clinical Measurements: Goal: Ability to maintain clinical measurements within normal limits will improve Outcome: Progressing   

## 2019-10-25 NOTE — Plan of Care (Signed)
  Problem: Nutrition: Goal: Adequate nutrition will be maintained Outcome: Progressing   Problem: Coping: Goal: Level of anxiety will decrease Outcome: Progressing   

## 2019-10-25 NOTE — TOC Initial Note (Signed)
Transition of Care [facility]) - Initial/Assessment Note    Patient Details  Name: [name] MRN: [id number] Date of Birth: 1951-02-17  Transition of Care [facility]) CM/SW Contact:    Jessica Feil, LCSW Phone Number: 10/25/2019, 3:26 PM  Clinical Narrative: CSW talked with patient and daughter [name] at bedside regarding her discharge disposition. CSW explained role in assisting with return to the facility. [name] allowed her daughter to actively participate in the conversation. Patient/daughter confirmed that she is from [facility] and will return at discharge. [name] reported that her mother has been to several SNF's and has not had good experiences and they were not pleased with the care she received. Daughter did not express any concerns regarding her mother care at [facility].  When asked, [name] responded that she has 3 siblings and everyone lives in [place]. And patient informed CSW that she lives alone and will return home from [facility]. Daughter indicated that she and her siblings will look after her, and she expressed awareness that the [facility] services will not provide care/supervision for any significant amount of time.   CSW advised that patient has a walker, toilet seat, wheelchair at home. Daughter indicated that her mom needs another walker.               Expected Discharge Plan: Skilled Nursing Facility Barriers to Discharge: Continued Medical Work up   Patient Goals and CMS Choice Patient states their goals for this hospitalization and ongoing recovery are:: Daughter wants patient to get better and then return to [facility] to continue rehab CMS Medicare.gov Compare Post Acute Care list provided to:: Other (Comment Required)(Patient from SNF) Choice offered to / list presented to : NA  Expected Discharge Plan and Services Expected Discharge Plan: [place] arrangements for the past 2 months: Apartment(Patient's home is an apartment; she is currently  at a SNF - [facility])                                     Prior Living Arrangements/Services Living arrangements for the past 2 months: Apartment(Patient's home is an apartment; she is currently at a SNF - [facility]) Lives with:: Facility Resident, Self(Patient came to hospital from SNF and will return home after rehab) Patient language and need for interpreter reviewed:: No Do you feel safe going back to the place where you live?: No   Patient will return to [facility] once medically cleared for discharge  Need for Family Participation in Patient Care: Yes (Comment) Care giver support system in place?: Yes (comment)   Criminal Activity/Legal Involvement Pertinent to Current Situation/Hospitalization: No - Comment as needed  Activities of Daily Living Home Assistive Devices/Equipment: Walker (specify type), CBG Meter ADL Screening (condition at time of admission) Patient's cognitive ability adequate to safely complete daily activities?: Yes Is the patient deaf or have difficulty hearing?: No Does the patient have difficulty seeing, even when wearing glasses/contacts?: No Does the patient have difficulty concentrating, remembering, or making decisions?: No Patient able to express need for assistance with ADLs?: Yes Does the patient have difficulty dressing or bathing?: Yes Independently performs ADLs?: No Communication: Independent Dressing (OT): Needs assistance Is this a change from baseline?: Change from baseline, expected to last >3 days Grooming: Needs assistance Is this a change from baseline?: Change from baseline, expected to last >3 days Feeding: Independent Bathing:  Needs assistance Is this a change from baseline?: Change from baseline, expected to last >3 days Toileting: Needs assistance Is this a change from baseline?: Change from baseline, expected to last >3days In/Out Bed: Needs assistance Is this a change from baseline?: Change from baseline,  expected to last >3 days Walks in Home: Needs assistance Is this a change from baseline?: Change from baseline, expected to last >3 days Does the patient have difficulty walking or climbing stairs?: Yes Weakness of Legs: Both Weakness of Arms/Hands: Both  Permission Sought/Granted Permission sought to share information with : Family Supports Permission granted to share information with : Yes, Verbal Permission Granted  Share Information with NAME: [name]     Permission granted to share info w Relationship: Daughter  Permission granted to share info w Contact Information: [phone]  Emotional Assessment Appearance:: Appears older than stated age Attitude/Demeanor/Rapport: Engaged Affect (typically observed): Pleasant Orientation: : Oriented to Self, Oriented to Place, Oriented to  Time, Oriented to Situation Alcohol / Substance Use: Tobacco Use, Alcohol Use, Illicit Drugs(Per H&P, patient quit smoking, and does not drink or use illicit drugs) Psych Involvement: No (comment)  Admission diagnosis:  AKI (acute kidney injury) ([place]) [N17.9] UTI due to extended-spectrum beta lactamase (ESBL) producing Escherichia coli [N39.0, B96.29, Z16.12] Patient Active Problem List   Diagnosis Date Noted  . Hypoxia 10/25/2019  . Acute metabolic encephalopathy 25/11/3974  . UTI due to extended-spectrum beta lactamase (ESBL) producing Escherichia coli 10/22/2019  . Goals of care, counseling/discussion   . Palliative care by specialist   . DNR (do not resuscitate) discussion   . Pressure injury of skin 09/18/2019  . Elevated lactic acid level   . Sepsis due to gram-negative UTI ([place]) 09/04/2019  . Urinary tract infection due to extended-spectrum beta lactamase (ESBL) producing Escherichia coli 09/04/2019  . Acute lower UTI 09/02/2019  . History of DVT (deep vein thrombosis) 09/02/2019  . Drug-induced polyneuropathy ([place]) 08/18/2019  . FTT (failure to thrive) in adult 08/17/2019  . AKI (acute  kidney injury) ([place]) 08/17/2019  . SOB (shortness of breath) 08/16/2019  . Lower extremity edema 08/16/2019  . Fall 08/08/2019  . Hypertension 08/08/2019  . COVID-19 virus infection 07/25/2019  . Elevated LFTs 07/25/2019  . COVID-19 07/25/2019  . Allergic contact dermatitis due to adhesives 05/23/2019  . Insomnia 05/23/2019  . SIRS (systemic inflammatory response syndrome) ([place]) 05/14/2019  . NSTEMI (non-ST elevated myocardial infarction) ([place]) 05/14/2019  . Hyperglycemia   . Neutropenia ([place])   . CAD (coronary artery disease)   . Obesity with serious comorbidity 04/16/2019  . Colitis 03/27/2019  . Falls frequently 03/27/2019  . Acute gastroenteritis 03/22/2019  . Hematoma of scalp 03/14/2019  . Vitamin D deficiency 11/09/2018  . Muscle spasm 10/19/2018  . Chest pain 09/07/2018  . Palpitations 09/07/2018  . Other fatigue 09/07/2018  . Acute ST elevation myocardial infarction (STEMI) of inferior wall ([place]) 08/27/2018    Class: Hospitalized for  . Atherosclerotic heart disease of native coronary artery with other forms of angina pectoris ([place]) 08/27/2018    Class: Diagnosis of  . Acute vaginitis 08/20/2018  . Hyperlipidemia associated with type 2 diabetes mellitus ([place]) 04/09/2018  . Bronchitis 11/02/2017  . Sepsis ([place]) 11/02/2017  . Leucocytosis 11/02/2017  . Moderate persistent asthma with acute exacerbation 05/26/2017  . Influenza A 05/26/2017  . Acute pain of right shoulder 03/07/2017  . Acute bilateral low back pain without sciatica 03/07/2017  . Chronic pain 03/07/2017  . Fatty liver 03/24/2016  .  Weakness 03/23/2016  . Unexplained weight loss 03/23/2016  . Melena 03/23/2016  . Hypotension 02/03/2016  . Syncope 02/03/2016  . Hypomagnesemia 02/03/2016  . Acute renal failure superimposed on stage 3 chronic kidney disease ([place]) 02/03/2016  . Gastrointestinal dysmotility 01/20/2016  . CKD (chronic kidney disease) stage 3, GFR 30-59 ml/min (HCC) 01/20/2016  . Abscess  of right thigh 01/20/2016  . Chronic pain syndrome 01/15/2016  . Diarrhea 01/15/2016  . Generalized abdominal pain 01/14/2016  . Type 2 diabetes with nephropathy ([place]) 12/30/2015  . TIA (transient ischemic attack) 07/2015  . Morbid obesity due to excess calories ([place]) 05/05/2015  . Diabetic polyneuropathy associated with type 2 diabetes mellitus ([place]) 01/02/2015  . Acute bacterial sinusitis 01/02/2015  . Onychomycosis 11/04/2014  . Pain in lower limb 11/04/2014  . Multinodular goiter (nontoxic) 10/16/2014  . Iron deficiency anemia 05/20/2014  . Major depressive disorder, single episode, moderate ([place]) 05/20/2014  . Dizziness of unknown cause 04/28/2014  . MDD (major depressive disorder), recurrent episode, moderate ([place]) 01/28/2014  . GERD (gastroesophageal reflux disease) 01/02/2014  . Nausea 10/15/2012  . Stomach cancer ([place]) 12/10/2008  . CHRONIC RESPIRATORY FAILURE 01/03/2008  . Hyperlipidemia 06/27/2007  . OBESITY, MORBID 06/27/2007  . SLEEP APNEA, OBSTRUCTIVE 06/27/2007  . Generalized idiopathic epilepsy and epileptic syndromes, not intractable, with status epilepticus ([place]) 06/27/2007  . Asthma 06/27/2007  . OSTEOARTHRITIS  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 06/27/2007   PCP:  Ann Held, DO Pharmacy:   [id number] [place] [place]Phone: [phone]: [phone] [address] [place] [id number]: [phone]: [phone]    Social Determinants of Health (SDOH) Interventions  No SDOH interventions needed or requested at this time.  Readmission Risk Interventions Readmission Risk Prevention Plan 09/05/2019 09/05/2019 05/16/2019  Transportation Screening Complete - Complete  Medication Review (Press photographer Complete - Complete  PCP or Specialist appointment within 3-5 days of discharge Complete - -   HRI or Home Care Consult Patient refused - Complete  SW Recovery Care/Counseling Consult Complete - Complete  Palliative Care Screening Not Applicable Not Applicable Not Applicable  Skilled Nursing Facility Complete Complete Not Applicable  Some recent data might be hidden

## 2019-10-25 NOTE — Discharge Instructions (Signed)

## 2019-10-25 NOTE — Progress Notes (Signed)
[facility]  PROGRESS NOTE  [name] ZOX:[id number] DOB: 1950-08-29 DOA: 10/22/2019 PCP: Ann Held, DO Admit date - 10/22/2019   Admitting Physician Etta Quill, DO  Outpatient Primary MD for the patient is Carollee Herter, Alferd Apa, DO  LOS - 3 Brief Narrative   [name] is a 69 y.o. year old female with medical history significant for stomach CA in remission, DM2, hypotension on midodrine, COVID 19 infection with no need for treatment but acute DVT (07/2019 now on Eliquis, and COVID who presented on 10/22/2019 with worsening abdominal pain was admitted with working diagnosis of UTI and concern for infectious colitis currently on IV meropenem.  Notable history for recent hospitalizations:  1/14-1/22/2021 for generalized weakness and recurrent falls after recent hospitalization for Covid infection periumbilical rash, AKI on CKD due to dehydration/poor appetite/nausea and orthostatic hypotension responsive to IV fluids patient was discharged home, declining SNF recommendations by PT/OT  1/22/--08/20/2019 for further thrive/generalized weakness and physical deconditioning from recent hospitalizations was AKI on CKD related to dehydration that responded to IV fluids.  Patient was discharged to skilled nursing facility at the time  2/8-2/12/21 nausea, vomiting, decreased appetite with concern for esophageal dysmotility and gastric dysmotility given patient's history of stage Ib gastric cancer.  Found to have large amount of gastric fluid retaining and recommended to have Reglan before meals  2/23-3/5 for septic shock requiring admission to ICU from ESBL E. coli UTI and possible aspiration pneumonia requiring pressors.     Subjective  Today states she is only oriented to self.  Confused on exam.  Denies any abdominal pain, states she is eating well without any nausea or vomiting.  Wants me to update her daughter.  A & P   Intermittent Hypoxia with reported bibasilar  ground-glass airspace disease concerning for multifocal pna per ct abd CXR unrevealing but admitting CT abd mentions bibasilar ground glass opacities. Has remained afebrile but leukocytosis has worsened slightly, new O2 requirements, persistent confusion and intermittent hypotension ( though low BP at baseline) this could be contributing. She's already on meropenem but MRSA swab positive so will add vancomycin. Had recent COVID infection without need for treatment on 07/2019 - monitor blood cultures, add strep pna, legionella - assess with ct chest - add flutter valve, incentive spirometry - add azithromycin for atypical coverage, vancomycin given recent hospitalization may be a consideration pending CT chest findings - Continue meropenem  Mild, diffuse colitis, concern for an infectious etiology or inflammatory.  Given recent history of very severe UTI infection presume likely infectious.  Feels abdominal pain is better, no nausea, and reports ate without issues -Monitor on oral diet, if tolerates will transition to oral antibiotics  -Continue IV meropenem -Blood cultures as needed -As needed Reglan -GIP pathogen panel, C. difficile if diarrhea starts  Acute on Chronic hypotension, stable.  Worse in the setting of likely infection with resultant decreased p.o. intake related to pain. SBP 80-90's throughout hospitalization, lactic acid x2 wnl, cortisol wnl. Still very dry on exam and though she reports eating I don't think its enough -Continue midodrine -Continue IV fluids until able to tolerate sustained p.o. intake  AKI on CKD stage III, likely multifactorial related to infectious etiology, diminished p.o. intake related to above, improving.  Baseline creatinine is wide ranging ~ 1-1.6, 1.87 on admission and currently 1.51 -Continue timed IV fluids, repeat BMP in a.m.  Left lower leg pain and swelling -obtain venous duplex to rule out DVT (has history, and  currently on eliquis)  Acute  metabolic encephalopathy.  Having bouts of confusion, anxiety, visual hallucinations per daughter, no HI for me but definitely only alert to self today on exam. No focal deficits. Suspect delirium in the setting of being in hospital and concurrent infection with hypotension as well as metabolic abnormalities -Delirium precautions -Address infection above -Monitor electrolytes -Does not have any focal deficits, continue closely monitor -avoid sedatives  Left hip/leg pain Unclear etiology. XR shows no acute fracture or arthritis. Venous duplex unremarkable.  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  EGD on 08/2019 -GI recommends small boluses of food/bariatric diet from last hospitalization -scheduled Reglan -Continue PPI  Hyperkalemia, mild.  Peak potassium 5.4, now 4.resolved -continue monitoring BMP  History of ESBL E. coli UTI.  Completed previous antibiotic course.  UA here positive however patient denies any urinary symptoms. -urine cx shows ESBL E.coli similar to prior  Type 2 diabetes with peripheral neuropathy, A1c 5.6.  -Continue to closely monitor on sliding scale as needed -Continue gabapentin for mood  Anemia of chronic disease No signs symptoms of bleeding.  Hemoglobin at baseline -Daily CBC  History of CAD status post PCI stent in 08/2018, asymptomatic -Continue home Lopressor  Recent DVT, diagnosed 08/21/2019 -continue home Eliquis  Chronic pain syndrome -Continue home tramadol  Mood disorder, stable - continue zoloft     Family Communication  : Updated daughter Katerina Zurn [phone] on 4/1, will update on 4/2  Code Status : Full  Disposition Plan :  Patient is from home. Anticipated d/c date: 2 to 3 days. Barriers to d/c or necessity for inpatient status: On Iv vancomycin in addition to meropenem, able to tolerate diet, BP stable improvement in AKI   Consults  : None  Procedures  : venous duplex  DVT Prophylaxis  : Eliquis  Lab Results  Component Value Date   PLT 217 10/25/2019    Diet :  Diet Order            Diet bariatric advanced Room service appropriate? Yes; Fluid consistency: Thin  Diet effective now               Inpatient Medications Scheduled Meds: . apixaban  5 mg Oral BID  . Chlorhexidine Gluconate Cloth  6 each Topical Daily  . famotidine  10 mg Oral Daily  . gabapentin  300 mg Oral Daily   And  . gabapentin  600 mg Oral QHS  . insulin aspart  0-15 Units Subcutaneous TID WC  . insulin aspart  0-5 Units Subcutaneous QHS  . metoCLOPramide  5 mg Oral TID AC & HS  . metoprolol tartrate  12.5 mg Oral BID  . midodrine  10 mg Oral TID WC  . multivitamin with minerals  1 tablet Oral Daily  . mupirocin ointment   Nasal BID  . nystatin   Topical TID  . oxybutynin  5 mg Oral Daily  . pantoprazole  40 mg Oral Daily  . rosuvastatin  20 mg Oral q1800  . sertraline  50 mg Oral Daily   Continuous Infusions: . sodium chloride 100 mL/hr at 10/25/19 1119  . meropenem (MERREM) IV 1 g (10/25/19 1314)   PRN Meds:.acetaminophen **OR** acetaminophen, albuterol, lidocaine-prilocaine, ondansetron **OR** ondansetron (ZOFRAN) IV, sodium chloride flush, sodium chloride flush, traMADol, traZODone  Antibiotics  :   Anti-infectives (From admission, onward)   Start     Dose/Rate Route Frequency Ordered  Stop   10/23/19 0200  meropenem (MERREM) 1 g in sodium chloride 0.9 % 100 mL IVPB     1 g 200 mL/hr over 30 Minutes Intravenous Every 12 hours 10/23/19 0045     10/23/19 0030  meropenem (MERREM) 1 g in sodium chloride 0.9 % 100 mL IVPB  Status:  Discontinued     1 g 200 mL/hr over 30 Minutes Intravenous  Once 10/23/19 0015 10/23/19 0044   10/22/19 2315  piperacillin-tazobactam (ZOSYN) IVPB 3.375 g  Status:  Discontinued     3.375 g 100 mL/hr over 30 Minutes Intravenous  Once 10/22/19 2307 10/22/19 2340       Objective    Vitals:   10/24/19 1832 10/24/19 2139 10/25/19 0438 10/25/19 1053  BP: 92/74 108/90 122/82 98/69  Pulse: 73 87 83 84  Resp: 18 17 15 16   Temp: 98.6 F (37 C) 98.2 F (36.8 C) (!) 97.4 F (36.3 C) 98.2 F (36.8 C)  TempSrc: Oral Oral Oral Oral  SpO2: (!) 89% 100% 93% 100%  Weight:  94 kg    Height:        SpO2: 100 % O2 Flow Rate (L/min): 3 L/min  Wt Readings from Last 3 Encounters:  10/24/19 94 kg  09/27/19 98 kg  09/05/19 94.1 kg     Intake/Output Summary (Last 24 hours) at 10/25/2019 1441 Last data filed at 10/25/2019 1314 Gross per 24 hour  Intake 2089.7 ml  Output 790 ml  Net 1299.7 ml    Physical Exam:     Awake Alert, Oriented to self only. Confused. No focal deficits [place].AT, Normal respiratory effort onn 3 L, no appreciable crackles or wheezing, decreased breath sounds at bases.  RRR,No Gallops,Rubs or new Murmurs,  Decreased bowel sounds, no tenderness or guarding Slight edema of bilateral lower extremities   I have personally reviewed the following:   Data Reviewed:  CBC Recent Labs  Lab 10/22/19 1756 10/23/19 0315 10/24/19 0500 10/25/19 0452  WBC 11.7* 10.8* 9.4 11.4*  HGB 9.3* 8.7* 8.1* 9.0*  HCT 26.6* 25.1* 23.2* 25.4*  PLT 232 216 207 217  MCV 87.2 87.8 86.9 86.1  MCH 30.5 30.4 30.3 30.5  MCHC 35.0 34.7 34.9 35.4  RDW 16.2* 16.5* 16.2* 16.3*    Chemistries  Recent Labs  Lab 10/22/19 1756 10/23/19 0315 10/24/19 0500 10/25/19 0452  NA 136 140 144 142  K 5.2* 5.4* 4.3 4.4  CL 102 109 112* 109  CO2 23 22 24 23   GLUCOSE 111* 83 78 137*  BUN 16 15 15 12   CREATININE 1.87* 1.71* 1.51* 1.31*  CALCIUM 7.6* 7.3* 6.9* 7.0*  AST 32  --   --   --   ALT 29  --   --   --   ALKPHOS 140*  --   --   --   BILITOT 1.2  --   --   --    ------------------------------------------------------------------------------------------------------------------ No results for input(s): CHOL, HDL, LDLCALC, TRIG, CHOLHDL, LDLDIRECT in the last 72  hours.  Lab Results  Component Value Date   HGBA1C 5.6 09/27/2019   ------------------------------------------------------------------------------------------------------------------ No results for input(s): TSH, T4TOTAL, T3FREE, THYROIDAB in the last 72 hours.  Invalid input(s): FREET3 ------------------------------------------------------------------------------------------------------------------ No results for input(s): VITAMINB12, FOLATE, FERRITIN, TIBC, IRON, RETICCTPCT in the last 72 hours.  Coagulation profile No results for input(s): INR, PROTIME in the last 168 hours.  No results for input(s): DDIMER in the last 72 hours.  Cardiac Enzymes No results for input(s): CKMB,  TROPONINI, MYOGLOBIN in the last 168 hours.  Invalid input(s): CK ------------------------------------------------------------------------------------------------------------------    Component Value Date/Time   BNP 138.0 (H) 09/17/2019 1725    Micro Results Recent Results (from the past 240 hour(s))  Urine culture     Status: Abnormal (Preliminary result)   Collection Time: 10/22/19  8:01 PM   Specimen: Urine, Random  Result Value Ref Range Status   Specimen Description URINE, RANDOM  Final   Special Requests NONE  Final   Culture (A)  Final    >=100,000 COLONIES/mL ESCHERICHIA COLI Confirmed Extended Spectrum Beta-Lactamase Producer (ESBL).  In bloodstream infections from ESBL organisms, carbapenems are preferred over piperacillin/tazobactam. They are shown to have a lower risk of mortality. 40,000 COLONIES/mL ENTEROCOCCUS FAECIUM SUSCEPTIBILITIES TO FOLLOW Performed at [facility], [place] [address], [place], [place] [address]    Report Status PENDING  Incomplete   Organism ID, Bacteria ESCHERICHIA COLI (A)  Final      Susceptibility   Escherichia coli - MIC*    AMPICILLIN >=32 RESISTANT Resistant     CEFAZOLIN >=64 RESISTANT Resistant     CEFTRIAXONE >=64 RESISTANT Resistant      CIPROFLOXACIN >=4 RESISTANT Resistant     GENTAMICIN <=1 SENSITIVE Sensitive     IMIPENEM <=0.25 SENSITIVE Sensitive     NITROFURANTOIN <=16 SENSITIVE Sensitive     TRIMETH/SULFA >=320 RESISTANT Resistant     AMPICILLIN/SULBACTAM 8 SENSITIVE Sensitive     PIP/TAZO <=4 SENSITIVE Sensitive     * >=100,000 COLONIES/mL ESCHERICHIA COLI  SARS CORONAVIRUS 2 (TAT 6-24 HRS) Nasopharyngeal Nasopharyngeal Swab     Status: None   Collection Time: 10/23/19  2:19 AM   Specimen: Nasopharyngeal Swab  Result Value Ref Range Status   SARS Coronavirus 2 NEGATIVE NEGATIVE Final    Comment: (NOTE) SARS-CoV-2 target nucleic acids are NOT DETECTED. The SARS-CoV-2 RNA is generally detectable in upper and lower respiratory specimens during the acute phase of infection. Negative results do not preclude SARS-CoV-2 infection, do not rule out co-infections with other pathogens, and should not be used as the sole basis for treatment or other patient management decisions. Negative results must be combined with clinical observations, patient history, and epidemiological information. The expected result is Negative. Fact Sheet for Patients: SugarRoll.be Fact Sheet for Healthcare Providers: https://www.woods-mathews.com/ This test is not yet approved or cleared by the [place] FDA and  has been authorized for detection and/or diagnosis of SARS-CoV-2 by FDA under an Emergency Use Authorization (EUA). This EUA will remain  in effect (meaning this test can be used) for the duration of the COVID-19 declaration under Section 56 4(b)(1) of the Act, 21 U.S.C. section 360bbb-3(b)(1), unless the authorization is terminated or revoked sooner. Performed at [facility], [place] [address], [place], [place] [address]   MRSA PCR Screening     Status: Abnormal   Collection Time: 10/24/19  1:54 PM   Specimen: Nasopharyngeal  Result Value Ref Range Status   MRSA by PCR POSITIVE  (A) NEGATIVE Final    Comment:        The GeneXpert MRSA Assay (FDA approved for NASAL specimens only), is one component of a comprehensive MRSA colonization surveillance program. It is not intended to diagnose MRSA infection nor to guide or monitor treatment for MRSA infections. RESULT CALLED TO, READ BACK BY AND VERIFIED WITH: Garlon Hatchet RN 15:50 10/24/19 ([email or web address]) Performed at [facility], [place] [address] [place], [place] [address]     Radiology Reports  CT ABDOMEN PELVIS WO CONTRAST  Result Date: 10/22/2019 CLINICAL DATA:  Acute nonlocalized abdominal pain EXAM: CT ABDOMEN AND PELVIS WITHOUT CONTRAST TECHNIQUE: Multidetector CT imaging of the abdomen and pelvis was performed following the standard protocol without IV contrast. COMPARISON:  09/20/2019, 08/08/2019 FINDINGS: Lower chest: Patchy bilateral lower lobe ground-glass airspace disease demonstrates minimal improvement. There is now a small left pleural effusion. Hepatobiliary: Stable severe fatty infiltration of the liver. No focal abnormality on this unenhanced exam. The gallbladder is distended without evidence of cholelithiasis or cholecystitis. Pancreas: Diffuse atrophy, stable.  No focal abnormality. Spleen: Normal in size without focal abnormality. Adrenals/Urinary Tract: No urinary tract calculi or obstructive uropathy. Mild left renal atrophy unchanged. Bladder is unremarkable. The adrenals are normal. Stomach/Bowel: There is mild diffuse colonic wall thickening which may be inflammatory or infectious. No bowel obstruction or ileus. 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 Vascular/Lymphatic: Aortic atherosclerosis. No enlarged abdominal or pelvic lymph nodes. Reproductive: Uterus and bilateral adnexa are unremarkable. Other: There is a small amount of free fluid within the pelvis, nonspecific. No free gas. Postsurgical changes are seen from previous midline laparotomy. Body wall edema is seen  throughout the lower abdomen and pelvis. Musculoskeletal: Right hip arthroplasty is identified. No acute or destructive bony lesions. Reconstructed images demonstrate no additional findings. IMPRESSION: 1. Mild diffuse colonic wall thickening which may reflect inflammatory or infectious colitis. 2. Persistent bibasilar ground-glass airspace disease compatible with multifocal pneumonia. New small left effusion. 3. Small amount of free fluid within the pelvis, nonspecific. 4. Diffuse fatty infiltration of the liver. Electronically Signed   By: Randa Ngo M.D.   On: 10/22/2019 21:24   DG CHEST PORT 1 VIEW  Result Date: 10/24/2019 CLINICAL DATA:  Hypoxia. EXAM: PORTABLE CHEST 1 VIEW COMPARISON:  Chest x-ray dated September 17, 2019. FINDINGS: Unchanged right chest wall port catheter. The heart size and mediastinal contours are within normal limits. Normal pulmonary vascularity. Low lung volumes. No focal consolidation, pleural effusion, or pneumothorax. No acute osseous abnormality. IMPRESSION: No active disease. Electronically Signed   By: Titus Dubin M.D.   On: 10/24/2019 17:05   DG HIP UNILAT WITH PELVIS 2-3 VIEWS LEFT  Result Date: 10/25/2019 CLINICAL DATA:  Hip pain, no injury EXAM: DG HIP (WITH OR WITHOUT PELVIS) 2-3V LEFT COMPARISON:  03/07/2019 FINDINGS: Status post right hip total arthroplasty. There is no evidence of hip fracture or dislocation. There is no evidence of arthropathy or other focal bone abnormality. IMPRESSION: 1. No fracture or dislocation of the left hip. No significant arthrosis. 2.  Status post right hip total arthroplasty. Electronically Signed   By: Eddie Candle M.D.   On: 10/25/2019 12:46   VAS [place] LOWER EXTREMITY VENOUS (DVT)  Result Date: 10/24/2019  Lower Venous DVTStudy Indications: Swelling.  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 Anticoagulation: Eliquis. Limitations: Body habitus and poor ultrasound/tissue interface. Comparison Study: Left LEV 08-25-19, negative. Performing  Technologist: Baldwin Crown ARDMS, RVT  Examination Guidelines: A complete evaluation includes B-mode imaging, spectral Doppler, color Doppler, and power Doppler as needed of all accessible portions of each vessel. Bilateral testing is considered an integral part of a complete examination. Limited examinations for reoccurring indications may be performed as noted. The reflux portion of the exam is performed with the patient in reverse Trendelenburg.  +-----+---------------+---------+-----------+----------+--------------+ RIGHTCompressibilityPhasicitySpontaneityPropertiesThrombus Aging +-----+---------------+---------+-----------+----------+--------------+ CFV  Full           Yes      Yes                                 +-----+---------------+---------+-----------+----------+--------------+   +---------+---------------+---------+-----------+----------+-----------------+  LEFT     CompressibilityPhasicitySpontaneityPropertiesThrombus Aging    +---------+---------------+---------+-----------+----------+-----------------+ CFV      Full           Yes      Yes                                    +---------+---------------+---------+-----------+----------+-----------------+ SFJ      Full                                                           +---------+---------------+---------+-----------+----------+-----------------+ FV Prox  Full                                                           +---------+---------------+---------+-----------+----------+-----------------+ FV Mid   Full                                                           +---------+---------------+---------+-----------+----------+-----------------+ FV DistalFull                                         poorly visualized +---------+---------------+---------+-----------+----------+-----------------+ PFV      Full                                                            +---------+---------------+---------+-----------+----------+-----------------+ POP      Full           Yes      Yes                                    +---------+---------------+---------+-----------+----------+-----------------+ PTV                                                   poorly visualized +---------+---------------+---------+-----------+----------+-----------------+ PERO                                                  poorly visualized +---------+---------------+---------+-----------+----------+-----------------+ Interstitial fluid seen in anterior distal lower extremity.    Summary: RIGHT: - No evidence of common femoral vein obstruction.  LEFT: - There is no evidence of deep vein thrombosis in the lower extremity. However, portions of this examination were limited- see technologist comments above.  - No cystic structure found in the popliteal fossa.  *  See table(s) above for measurements and observations. Electronically signed by Monica Martinez MD on 10/24/2019 at 5:25:14 PM.    Final      Time Spent in minutes  30     Desiree Hane M.D on 10/25/2019 at 2:41 PM  To page go to [email or web address] - password [facility]

## 2019-10-25 NOTE — Progress Notes (Signed)
Pharmacy Antibiotic Note  [name] is a 69 y.o. female admitted on 10/22/2019 with pneumonia.  Pharmacy has been consulted for Vancomycin dosing.  Vancomycin 1250 mg IV Q 24 hrs. Goal AUC 400-550. Expected AUC: 502 SCr used: 1.31  Plan: Start Vancomycin 1250 mg IV q24h Monitor renal function, C&S and vanc levels as indicated.  Height: 5' 6"  (167.6 cm) Weight: 94 kg (207 lb 3.7 oz) IBW/kg (Calculated) : 59.3  Temp (24hrs), Avg:98 F (36.7 C), Min:97.4 F (36.3 C), Max:98.6 F (37 C)  Recent Labs  Lab 10/22/19 1756 10/23/19 0315 10/23/19 1411 10/23/19 1833 10/24/19 0500 10/25/19 0452  WBC 11.7* 10.8*  --   --  9.4 11.4*  CREATININE 1.87* 1.71*  --   --  1.51* 1.31*  LATICACIDVEN  --   --  1.4 1.3  --   --     Estimated Creatinine Clearance: 48.2 mL/min (A) (by C-G formula based on SCr of 1.31 mg/dL (H)).    Allergies  Allergen Reactions  . Cephalexin Itching, Rash and Other (See Comments)    Sensation of throat closing. Tolerated pip/tazo, cefepime, ceftriaxone before   . Adhesive [Tape] Other (See Comments)    Burn Skin  . Codeine Other (See Comments)    paranoid  . Zocor [Simvastatin - High Dose] Other (See Comments)    Muscle spasms    Antimicrobials this admission: Vanc 4/2 >>   Thank you for allowing pharmacy to be a part of this patient's care.  Alanda Slim, PharmD, [facility] Clinical Pharmacist Please see AMION for all Pharmacists' Contact Phone Numbers 10/25/2019, 3:08 PM

## 2019-10-25 NOTE — Progress Notes (Signed)
10/25/2019 3:34 PM  Spoke to patient family regarding care concerns and assisted in service recovery.Permission has been granted for Leanora Murin (daughter) to stay overnight with patient.   Londan Coplen MSN, RN-BC, CNML [facility] Phone: [phone]

## 2019-10-26 DIAGNOSIS — R918 Other nonspecific abnormal finding of lung field: Secondary | ICD-10-CM

## 2019-10-26 DIAGNOSIS — N1831 Chronic kidney disease, stage 3a: Secondary | ICD-10-CM

## 2019-10-26 DIAGNOSIS — J9601 Acute respiratory failure with hypoxia: Secondary | ICD-10-CM

## 2019-10-26 LAB — BASIC METABOLIC PANEL
Anion gap: 9 (ref 5–15)
BUN: 10 mg/dL (ref 8–23)
CO2: 23 mmol/L (ref 22–32)
Calcium: 6.7 mg/dL — ABNORMAL LOW (ref 8.9–10.3)
Chloride: 110 mmol/L (ref 98–111)
Creatinine, Ser: 1.24 mg/dL — ABNORMAL HIGH (ref 0.44–1.00)
GFR calc Af Amer: 52 mL/min — ABNORMAL LOW (ref >60)
GFR calc non Af Amer: 45 mL/min — ABNORMAL LOW (ref >60)
Glucose, Bld: 166 mg/dL — ABNORMAL HIGH (ref 70–99)
Potassium: 4 mmol/L (ref 3.5–5.1)
Sodium: 142 mmol/L (ref 135–145)

## 2019-10-26 LAB — CBC
HCT: 24.3 % — ABNORMAL LOW (ref 36.0–46.0)
Hemoglobin: 8.5 g/dL — ABNORMAL LOW (ref 12.0–15.0)
MCH: 30.5 pg (ref 26.0–34.0)
MCHC: 35 g/dL (ref 30.0–36.0)
MCV: 87.1 fL (ref 80.0–100.0)
Platelets: 200 10*3/uL (ref 150–400)
RBC: 2.79 MIL/uL — ABNORMAL LOW (ref 3.87–5.11)
RDW: 16.8 % — ABNORMAL HIGH (ref 11.5–15.5)
WBC: 11.2 10*3/uL — ABNORMAL HIGH (ref 4.0–10.5)
nRBC: 0.6 % — ABNORMAL HIGH (ref 0.0–0.2)

## 2019-10-26 LAB — STREP PNEUMONIAE URINARY ANTIGEN: Strep Pneumo Urinary Antigen: NEGATIVE

## 2019-10-26 LAB — URINE CULTURE: Culture: >=100000 — AB

## 2019-10-26 LAB — GLUCOSE, CAPILLARY
Glucose-Capillary: 120 mg/dL — ABNORMAL HIGH (ref 70–99)
Glucose-Capillary: 154 mg/dL — ABNORMAL HIGH (ref 70–99)
Glucose-Capillary: 115 mg/dL — ABNORMAL HIGH (ref 70–99)
Glucose-Capillary: 101 mg/dL — ABNORMAL HIGH (ref 70–99)

## 2019-10-26 LAB — PROCALCITONIN: Procalcitonin: 1.25 ng/mL

## 2019-10-26 NOTE — Progress Notes (Signed)
[facility]  PROGRESS NOTE  [name] WVP:[id number] DOB: 04-01-1951 DOA: 10/22/2019 PCP: [name] Admit date - 10/22/2019   Admitting Physician Etta Quill, Jefferson  Outpatient Primary MD for the patient is [name], [name]  LOS - 4 Brief Narrative   [name] is a 69 y.o. year old female with medical history significant for stomach CA in remission, DM2, hypotension on midodrine, COVID 19 infection with no need for treatment but acute DVT (07/2019 now on Eliquis, and COVID who presented on 10/22/2019 with worsening abdominal pain was admitted with working diagnosis of UTI and concern for infectious colitis currently on IV meropenem.  Hospital Course: Initially empirically treated with meropenem for mild diffuse colitis in setting of recent ESBL UTI. On 4/2 ct chest for hypoxia (SpO 279% on 2 L)confirmed ground glass airspace disease that either representing multifocal pneumonia or postinflammatory scarring from recent COVID-19 infection (January 2021).  Patient was started on empiric vancomycin on 4/2 in addition to meropenem given positive MRSA swab and above findings  Notable history for recent hospitalizations:  1/14-1/22/2021 for generalized weakness and recurrent falls after recent hospitalization for Covid infection periumbilical rash, AKI on CKD due to dehydration/poor appetite/nausea and orthostatic hypotension responsive to IV fluids patient was discharged home, declining SNF recommendations by PT/OT  1/22/--08/20/2019 for further thrive/generalized weakness and physical deconditioning from recent hospitalizations was AKI on CKD related to dehydration that responded to IV fluids.  Patient was discharged to skilled nursing facility at the time  2/8-2/12/21 nausea, vomiting, decreased appetite with concern for esophageal dysmotility and gastric dysmotility given patient's history of stage Ib gastric cancer.  Found to have large amount of gastric fluid  retaining and recommended to have Reglan before meals  2/23-3/5 for septic shock requiring admission to ICU from ESBL E. coli UTI and possible aspiration pneumonia requiring pressors.    Subjective  Today states she is only oriented to self.  Confused on exam.  Denies any abdominal pain, states she is eating well without any nausea or vomiting.  Wants me to update her daughter.  A & P   Acute hypoxic respiratory failure with groundglass airspace disease.  Unclear if multifocal pneumonia or postinflammatory scarring from recent COVID-19 infection (January 2021, that did not require any treatment at the time).  O2 requirements are decreasing currently on 1 L down from 3 L.  Not endorsing any specific respiratory symptoms, remains afebrile with mild leukocytosis.  Given MRSA swab was positive patient was added on vancomycin on 4/2 -Continue empiric vancomycin, meropenem, azithromycin typical -Pending strep pneumo, Legionella, obtain sputum culture if has productive cough -Monitor blood cultures -We will check pro-Cal -Considering prednisone if this is not a infectious etiology.   -Encourage flutter valve, incentive spirometry -Encourage out of bed to chair, up in bed with assistance  Mild, diffuse colitis, concern for an infectious etiology   Given recent history of very severe UTI infection presume likely infectious.  Tolerating p.o., without nausea or vomiting -Continue above antibiotics   -Blood cultures as needed -As needed Reglan -GIP pathogen panel, C. difficile if diarrhea starts  Acute on Chronic hypotension, improving.  SBP is now mid 90s.  Has not had any lactic acidosis.  cortisol wnl.  -Continue midodrine -Continue to encourage oral intake -DC IV fluids, does not look volume depleted on exam  AKI on CKD stage III, likely multifactorial related to infectious etiology, diminished p.o. intake related to above, improving.  Baseline creatinine  is wide ranging ~ 1-1.6, 1.87 on  admission and now back to baseline. Improved with IVF --Encourage oral intake, daily BMP, avoid nephrotoxins  Left lower leg pain and swelling No longer having any swelling.  Seems MSK in nature or neuropathic related to DM. Improved on home tramadol --consider neuropathic -Left leg x-ray and venous duplex negative for acute fracture or DVT respectively -continue home pain medicine  Acute metabolic encephalopathy, improving.  Bouts of confusion/visual loose [place] have decreased.  Suspect hospital-acquired delirium in the setting of infectious nidus. -Continue delirium precautions -Does not have any focal deficits, does not warrant head imaging  -Delirium precautions -avoid sedatives  GERD/gastritis with significant esophageal dysmotility without stricture based on recent EGD on 08/2019.  EGD on 08/2019 -GI recommends small boluses of food/bariatric diet from last hospitalization -scheduled Reglan -Continue PPI  Hyperkalemia, mild.  Peak potassium 5.4, resolved -continue monitoring BMP  History of ESBL E. coli UTI.  Completed previous antibiotic course.  UA here positive however patient denies any urinary symptoms. -urine cx shows ESBL E.coli similar to prior  Type 2 diabetes with peripheral neuropathy, A1c 5.6.  -Continue to closely monitor on sliding scale as needed -Continue gabapentin for mood  Anemia of chronic disease No signs symptoms of bleeding.  Hemoglobin at baseline -Daily CBC  History of CAD status post PCI stent in 08/2018, asymptomatic -Continue home Lopressor  Recent DVT, diagnosed 08/21/2019 -continue home Eliquis  Chronic pain syndrome -Continue home tramadol  Mood disorder, stable - continue zoloft  Reported worsening chronic blurry vision, worse on right eye Patient does have history of diabetes -Has ophthalmology appointment as outpatient on 4/13     Family Communication  : Updated daughter [name] [phone] on 4/1, updated at bedside on  4/3  Code Status : Full  Disposition Plan :  Patient is from home. Anticipated d/c date: 2 to 3 days. Barriers to d/c or necessity for inpatient status: On Iv vancomycin in addition to meropenem for presumed pneumonia Consults  : None  Procedures  : venous duplex  DVT Prophylaxis  : Eliquis  Lab Results  Component Value Date   PLT 200 10/26/2019    Diet :  Diet Order            Diet bariatric advanced Room service appropriate? Yes; Fluid consistency: Thin  Diet effective now               Inpatient Medications Scheduled Meds: . apixaban  5 mg Oral BID  . Chlorhexidine Gluconate Cloth  6 each Topical Daily  . famotidine  10 mg Oral Daily  . gabapentin  300 mg Oral Daily   And  . gabapentin  600 mg Oral QHS  . insulin aspart  0-15 Units Subcutaneous TID WC  . insulin aspart  0-5 Units Subcutaneous QHS  . metoCLOPramide  5 mg Oral TID AC & HS  . metoprolol tartrate  12.5 mg Oral BID  . midodrine  10 mg Oral TID WC  . multivitamin with minerals  1 tablet Oral Daily  . mupirocin ointment   Nasal BID  . nystatin   Topical TID  . oxybutynin  5 mg Oral Daily  . pantoprazole  40 mg Oral Daily  . rosuvastatin  20 mg Oral q1800  . sertraline  50 mg Oral Daily   Continuous Infusions: . azithromycin 250 mg (10/26/19 0959)  . meropenem (MERREM) IV 1 g (10/26/19 0119)  . vancomycin 1,250 mg (10/25/19 1804)   PRN Meds:.acetaminophen **  OR** acetaminophen, albuterol, lidocaine-prilocaine, ondansetron **OR** ondansetron (ZOFRAN) IV, sodium chloride flush, sodium chloride flush, traMADol, traZODone  Antibiotics  :   Anti-infectives (From admission, onward)   Start     Dose/Rate Route Frequency Ordered Stop   10/26/19 1000  azithromycin (ZITHROMAX) 250 mg in dextrose 5 % 125 mL IVPB     250 mg 125 mL/hr over 60 Minutes Intravenous Every 24 hours 10/25/19 1453 10/30/19 0959   10/25/19 1530  vancomycin (VANCOREADY) IVPB 1250 mg/250 mL     1,250 mg 166.7 mL/hr over 90 Minutes  Intravenous Every 24 hours 10/25/19 1509     10/25/19 1500  azithromycin (ZITHROMAX) 500 mg in sodium chloride 0.9 % 250 mL IVPB     500 mg 250 mL/hr over 60 Minutes Intravenous  Once 10/25/19 1453 10/25/19 1900   10/23/19 0200  meropenem (MERREM) 1 g in sodium chloride 0.9 % 100 mL IVPB     1 g 200 mL/hr over 30 Minutes Intravenous Every 12 hours 10/23/19 0045     10/23/19 0030  meropenem (MERREM) 1 g in sodium chloride 0.9 % 100 mL IVPB  Status:  Discontinued     1 g 200 mL/hr over 30 Minutes Intravenous  Once 10/23/19 0015 10/23/19 0044   10/22/19 2315  piperacillin-tazobactam (ZOSYN) IVPB 3.375 g  Status:  Discontinued     3.375 g 100 mL/hr over 30 Minutes Intravenous  Once 10/22/19 2307 10/22/19 2340       Objective   Vitals:   10/25/19 1721 10/25/19 1957 10/26/19 0655 10/26/19 0940  BP: (!) 88/60 90/64 106/63 96/61  Pulse: 80 83 100 (!) 102  Resp: 18 18 18 18   Temp: 97.7 F (36.5 C) 98.2 F (36.8 C) 98.3 F (36.8 C) 99 F (37.2 C)  TempSrc: Oral Oral  Oral  SpO2: 92% 100% 95% 99%  Weight:      Height:        SpO2: 99 % O2 Flow Rate (L/min): 1 L/min  Wt Readings from Last 3 Encounters:  10/24/19 94 kg  09/27/19 98 kg  09/05/19 94.1 kg     Intake/Output Summary (Last 24 hours) at 10/26/2019 1252 Last data filed at 10/26/2019 0925 Gross per 24 hour  Intake 3802.88 ml  Output 400 ml  Net 3402.88 ml    Physical Exam:  Awake Alert, Oriented to self, place, context.  No distress. No focal deficits [place].AT, Normal respiratory effort on 1 L, no appreciable crackles or wheezing, decreased breath sounds at bases.  RRR,No Gallops,Rubs or new Murmurs,  Normal bowel sounds, no tenderness or guarding No edema    I have personally reviewed the following:   Data Reviewed:  CBC Recent Labs  Lab 10/22/19 1756 10/23/19 0315 10/24/19 0500 10/25/19 0452 10/26/19 0402  WBC 11.7* 10.8* 9.4 11.4* 11.2*  HGB 9.3* 8.7* 8.1* 9.0* 8.5*  HCT 26.6* 25.1* 23.2* 25.4*  24.3*  PLT 232 216 207 217 200  MCV 87.2 87.8 86.9 86.1 87.1  MCH 30.5 30.4 30.3 30.5 30.5  MCHC 35.0 34.7 34.9 35.4 35.0  RDW 16.2* 16.5* 16.2* 16.3* 16.8*    Chemistries  Recent Labs  Lab 10/22/19 1756 10/23/19 0315 10/24/19 0500 10/25/19 0452 10/26/19 0402  NA 136 140 144 142 142  K 5.2* 5.4* 4.3 4.4 4.0  CL 102 109 112* 109 110  CO2 23 22 24 23 23   GLUCOSE 111* 83 78 137* 166*  BUN 16 15 15 12 10   CREATININE 1.87* 1.71* 1.51* 1.31* 1.24*  CALCIUM 7.6* 7.3* 6.9* 7.0* 6.7*  AST 32  --   --   --   --   ALT 29  --   --   --   --   ALKPHOS 140*  --   --   --   --   BILITOT 1.2  --   --   --   --    ------------------------------------------------------------------------------------------------------------------ No results for input(s): CHOL, HDL, LDLCALC, TRIG, CHOLHDL, LDLDIRECT in the last 72 hours.  Lab Results  Component Value Date   HGBA1C 5.6 09/27/2019   ------------------------------------------------------------------------------------------------------------------ No results for input(s): TSH, T4TOTAL, T3FREE, THYROIDAB in the last 72 hours.  Invalid input(s): FREET3 ------------------------------------------------------------------------------------------------------------------ No results for input(s): VITAMINB12, FOLATE, FERRITIN, TIBC, IRON, RETICCTPCT in the last 72 hours.  Coagulation profile No results for input(s): INR, PROTIME in the last 168 hours.  No results for input(s): DDIMER in the last 72 hours.  Cardiac Enzymes No results for input(s): CKMB, TROPONINI, MYOGLOBIN in the last 168 hours.  Invalid input(s): CK ------------------------------------------------------------------------------------------------------------------    Component Value Date/Time   BNP 138.0 (H) 09/17/2019 1725    Micro Results Recent Results (from the past 240 hour(s))  Urine culture     Status: Abnormal   Collection Time: 10/22/19  8:01 PM   Specimen: Urine,  Random  Result Value Ref Range Status   Specimen Description URINE, RANDOM  Final   Special Requests   Final    NONE Performed at [facility], [address] [place], [place] [address]    Culture (A)  Final    >=100,000 COLONIES/mL ESCHERICHIA COLI Confirmed Extended Spectrum Beta-Lactamase Producer (ESBL).  In bloodstream infections from ESBL organisms, carbapenems are preferred over piperacillin/tazobactam. They are shown to have a lower risk of mortality. 40,000 COLONIES/mL VANCOMYCIN RESISTANT ENTEROCOCCUS    Report Status 10/26/2019 FINAL  Final   Organism ID, Bacteria ESCHERICHIA COLI (A)  Final   Organism ID, Bacteria VANCOMYCIN RESISTANT ENTEROCOCCUS (A)  Final      Susceptibility   Escherichia coli - MIC*    AMPICILLIN >=32 RESISTANT Resistant     CEFAZOLIN >=64 RESISTANT Resistant     CEFTRIAXONE >=64 RESISTANT Resistant     CIPROFLOXACIN >=4 RESISTANT Resistant     GENTAMICIN <=1 SENSITIVE Sensitive     IMIPENEM <=0.25 SENSITIVE Sensitive     NITROFURANTOIN <=16 SENSITIVE Sensitive     TRIMETH/SULFA >=320 RESISTANT Resistant     AMPICILLIN/SULBACTAM 8 SENSITIVE Sensitive     PIP/TAZO <=4 SENSITIVE Sensitive     * >=100,000 COLONIES/mL ESCHERICHIA COLI   Vancomycin resistant enterococcus - MIC*    AMPICILLIN >=32 RESISTANT Resistant     NITROFURANTOIN 64 INTERMEDIATE Intermediate     VANCOMYCIN >=32 RESISTANT Resistant     LINEZOLID 2 SENSITIVE Sensitive     * 40,000 COLONIES/mL VANCOMYCIN RESISTANT ENTEROCOCCUS  SARS CORONAVIRUS 2 (TAT 6-24 HRS) Nasopharyngeal Nasopharyngeal Swab     Status: None   Collection Time: 10/23/19  2:19 AM   Specimen: Nasopharyngeal Swab  Result Value Ref Range Status   SARS Coronavirus 2 NEGATIVE NEGATIVE Final    Comment: (NOTE) SARS-CoV-2 target nucleic acids are NOT DETECTED. The SARS-CoV-2 RNA is generally detectable in upper and lower respiratory specimens during the acute phase of infection. Negative results Jefferson not  preclude SARS-CoV-2 infection, Jefferson not rule out co-infections with other pathogens, and should not be used as the sole basis for treatment or other patient management decisions. Negative results must be combined with clinical  observations, patient history, and epidemiological information. The expected result is Negative. Fact Sheet for Patients: SugarRoll.be Fact Sheet for Healthcare Providers: https://www.woods-mathews.com/ This test is not yet approved or cleared by the [place] FDA and  has been authorized for detection and/or diagnosis of SARS-CoV-2 by FDA under an Emergency Use Authorization (EUA). This EUA will remain  in effect (meaning this test can be used) for the duration of the COVID-19 declaration under Section 56 4(b)(1) of the Act, 21 U.S.C. section 360bbb-3(b)(1), unless the authorization is terminated or revoked sooner. Performed at [facility], [place] [address] [place], [place] [address]   MRSA PCR Screening     Status: Abnormal   Collection Time: 10/24/19  1:54 PM   Specimen: Nasopharyngeal  Result Value Ref Range Status   MRSA by PCR POSITIVE (A) NEGATIVE Final    Comment:        The GeneXpert MRSA Assay (FDA approved for NASAL specimens only), is one component of a comprehensive MRSA colonization surveillance program. It is not intended to diagnose MRSA infection nor to guide or monitor treatment for MRSA infections. RESULT CALLED TO, READ BACK BY AND VERIFIED WITH: Garlon Hatchet RN 15:50 10/24/19 ([email or web address]) Performed at [facility], [place] [address] [place], [place] [address]     Radiology Reports CT ABDOMEN PELVIS WO CONTRAST  Result Date: 10/22/2019 CLINICAL DATA:  Acute nonlocalized abdominal pain EXAM: CT ABDOMEN AND PELVIS WITHOUT CONTRAST TECHNIQUE: Multidetector CT imaging of the abdomen and pelvis was performed following the standard protocol without IV contrast. COMPARISON:  09/20/2019,  08/08/2019 FINDINGS: Lower chest: Patchy bilateral lower lobe ground-glass airspace disease demonstrates minimal improvement. There is now a small left pleural effusion. Hepatobiliary: Stable severe fatty infiltration of the liver. No focal abnormality on this unenhanced exam. The gallbladder is distended without evidence of cholelithiasis or cholecystitis. Pancreas: Diffuse atrophy, stable.  No focal abnormality. Spleen: Normal in size without focal abnormality. Adrenals/Urinary Tract: No urinary tract calculi or obstructive uropathy. Mild left renal atrophy unchanged. Bladder is unremarkable. The adrenals are normal. Stomach/Bowel: There is mild diffuse colonic wall thickening which may be inflammatory or infectious. No bowel obstruction or ileus. 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 Vascular/Lymphatic: Aortic atherosclerosis. No enlarged abdominal or pelvic lymph nodes. Reproductive: Uterus and bilateral adnexa are unremarkable. Other: There is a small amount of free fluid within the pelvis, nonspecific. No free gas. Postsurgical changes are seen from previous midline laparotomy. Body wall edema is seen throughout the lower abdomen and pelvis. Musculoskeletal: Right hip arthroplasty is identified. No acute or destructive bony lesions. Reconstructed images demonstrate no additional findings. IMPRESSION: 1. Mild diffuse colonic wall thickening which may reflect inflammatory or infectious colitis. 2. Persistent bibasilar ground-glass airspace disease compatible with multifocal pneumonia. New small left effusion. 3. Small amount of free fluid within the pelvis, nonspecific. 4. Diffuse fatty infiltration of the liver. Electronically Signed   By: Randa Ngo M.D.   On: 10/22/2019 21:24   CT CHEST WO CONTRAST  Result Date: 10/25/2019 CLINICAL DATA:  Hypoxia, multifocal pneumonia on abdominal CT, history of COVID-13 August 2019 EXAM: CT CHEST WITHOUT CONTRAST TECHNIQUE: Multidetector CT  imaging of the chest was performed following the standard protocol without IV contrast. COMPARISON:  10/24/2019, 10/22/2019, 11/02/2017 FINDINGS: Cardiovascular: The heart is normal in size without pericardial effusion. There is severe atherosclerosis of the coronary vasculature. Mild atherosclerosis of the aortic arch. Thoracic aorta is normal in caliber. Mediastinum/Nodes: No pathologic adenopathy. Thyroid, trachea, and esophagus  are stable. Lungs/Pleura: There are patchy bilateral areas of ground-glass airspace disease. Overall, in comparison to abdominal CT 08/08/2019, there is only minimal interval improvement. Small bilateral pleural effusions have developed. No pneumothorax. The central airways are patent. Upper Abdomen: Stable fatty infiltration of the liver. Small amount of ascites now seen within the upper abdomen. Musculoskeletal: There is mild diffuse subcutaneous edema. No acute displaced fracture. Reconstructed images demonstrate no additional findings. IMPRESSION: 1. Persistent multifocal areas of ground-glass airspace disease. This could reflect persistent pneumonia or postinflammatory scarring, given history of COVID-19 infection January 2021. 2. Interval development of small bilateral pleural effusions, volume estimated less than 500 cc each. 3. Interval development of small volume ascites within the upper abdomen. 4. Fatty liver. 5. Aortic Atherosclerosis (ICD10-I70.0). Severe coronary atherosclerosis. Electronically Signed   By: Randa Ngo M.D.   On: 10/25/2019 17:11   DG CHEST PORT 1 VIEW  Result Date: 10/24/2019 CLINICAL DATA:  Hypoxia. EXAM: PORTABLE CHEST 1 VIEW COMPARISON:  Chest x-ray dated September 17, 2019. FINDINGS: Unchanged right chest wall port catheter. The heart size and mediastinal contours are within normal limits. Normal pulmonary vascularity. Low lung volumes. No focal consolidation, pleural effusion, or pneumothorax. No acute osseous abnormality. IMPRESSION: No active  disease. Electronically Signed   By: Titus Dubin M.D.   On: 10/24/2019 17:05   DG HIP UNILAT WITH PELVIS 2-3 VIEWS LEFT  Result Date: 10/25/2019 CLINICAL DATA:  Hip pain, no injury EXAM: DG HIP (WITH OR WITHOUT PELVIS) 2-3V LEFT COMPARISON:  03/07/2019 FINDINGS: Status post right hip total arthroplasty. There is no evidence of hip fracture or dislocation. There is no evidence of arthropathy or other focal bone abnormality. IMPRESSION: 1. No fracture or dislocation of the left hip. No significant arthrosis. 2.  Status post right hip total arthroplasty. Electronically Signed   By: Eddie Candle M.D.   On: 10/25/2019 12:46   VAS [place] LOWER EXTREMITY VENOUS (DVT)  Result Date: 10/24/2019  Lower Venous DVTStudy Indications: Swelling.  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 Anticoagulation: Eliquis. Limitations: Body habitus and poor ultrasound/tissue interface. Comparison Study: Left LEV 08-25-19, negative. Performing Technologist: [place] Crown ARDMS, RVT  Examination Guidelines: A complete evaluation includes B-mode imaging, spectral Doppler, color Doppler, and power Doppler as needed of all accessible portions of each vessel. Bilateral testing is considered an integral part of a complete examination. Limited examinations for reoccurring indications may be performed as noted. The reflux portion of the exam is performed with the patient in reverse Trendelenburg.  +-----+---------------+---------+-----------+----------+--------------+ RIGHTCompressibilityPhasicitySpontaneityPropertiesThrombus Aging +-----+---------------+---------+-----------+----------+--------------+ CFV  Full           Yes      Yes                                 +-----+---------------+---------+-----------+----------+--------------+   +---------+---------------+---------+-----------+----------+-----------------+ LEFT     CompressibilityPhasicitySpontaneityPropertiesThrombus Aging     +---------+---------------+---------+-----------+----------+-----------------+ CFV      Full           Yes      Yes                                    +---------+---------------+---------+-----------+----------+-----------------+ SFJ      Full                                                           +---------+---------------+---------+-----------+----------+-----------------+  FV Prox  Full                                                           +---------+---------------+---------+-----------+----------+-----------------+ FV Mid   Full                                                           +---------+---------------+---------+-----------+----------+-----------------+ FV DistalFull                                         poorly visualized +---------+---------------+---------+-----------+----------+-----------------+ PFV      Full                                                           +---------+---------------+---------+-----------+----------+-----------------+ POP      Full           Yes      Yes                                    +---------+---------------+---------+-----------+----------+-----------------+ PTV                                                   poorly visualized +---------+---------------+---------+-----------+----------+-----------------+ PERO                                                  poorly visualized +---------+---------------+---------+-----------+----------+-----------------+ Interstitial fluid seen in anterior distal lower extremity.    Summary: RIGHT: - No evidence of common femoral vein obstruction.  LEFT: - There is no evidence of deep vein thrombosis in the lower extremity. However, portions of this examination were limited- see technologist comments above.  - No cystic structure found in the popliteal fossa.  *See table(s) above for measurements and observations. Electronically signed by Monica Martinez MD  on 10/24/2019 at 5:25:14 PM.    Final      Time Spent in minutes  30     Desiree Hane M.D on 10/26/2019 at 12:52 PM  To page go to [email or web address] - password [facility]

## 2019-10-26 NOTE — Progress Notes (Signed)
Physical Therapy Evaluation Patient Details Name: [name] MRN: [id number] DOB: Apr 16, 1951 Today's Date: 10/26/2019   History of Present Illness  69 yo female with onset of abd pain on 3/30 and colitis, UTI, was admitted for symptoms.  Pt was hypoxic on 2L at 79%, feeling weak.  Has PNA or post Covid changes in lungs, had recent admission for UTI septic shock in early March 2021.  Has acute metabolic encephalopathy, gastritis, PMHx:  Covid 43, UTI, AKI, CKD, dehydration, falls, hypotension, DM, DVT, PN, mood disorder, blurry vision, anemia,   Clinical Impression  Pt was seen for initial eval with a great deal of weakness and struggle to control balance on side of bed.  Pt was not feeling well enough to let PT assist to stand, but has agreed to try to scoot up side of bed with max assist.  Agreed to assist but is tired and needs mod assist to get back to bed.  Has limited family help for home so recommending SNF to restore strength and standing mobility tolerance.  Follow acutely to get pt OOB and standing on RW for progression to gait as she can tolerate.    Follow Up Recommendations SNF    Equipment Recommendations  Rolling walker with 5" wheels    Recommendations for Other Services       Precautions / Restrictions Precautions Precautions: Fall Precaution Comments: monitor O2 sats Restrictions Weight Bearing Restrictions: No      Mobility  Bed Mobility Overal bed mobility: Needs Assistance Bed Mobility: Supine to Sit;Sit to Supine     Supine to sit: Mod assist Sit to supine: Mod assist   General bed mobility comments: mod assist to get up with trunk support, assist to scoot forward in bed  Transfers Overall transfer level: Needs assistance               General transfer comment: declines to let PT assist her  Ambulation/Gait             General Gait Details: declines  Stairs            Wheelchair Mobility    Modified Rankin (Stroke Patients  Only)       Balance Overall balance assessment: Needs assistance Sitting-balance support: Feet supported;Bilateral upper extremity supported Sitting balance-Leahy Scale: Poor                                       Pertinent Vitals/Pain Pain Assessment: No/denies pain    Home Living Family/patient expects to be discharged to:: Private residence Living Arrangements: Alone   Type of Home: Apartment Home Access: Stairs to enter Entrance Stairs-Rails: Right Entrance Stairs-Number of Steps: 1 Home Layout: One level Home Equipment: Walker - 4 wheels;Shower seat;Walker - 2 wheels Additional Comments: family to assist her but not there around the clock    Prior Function Level of Independence: Independent with assistive device(s)         Comments: using RW vs SPC but mainly RW     Hand Dominance   Dominant Hand: Right    Extremity/Trunk Assessment   Upper Extremity Assessment Upper Extremity Assessment: Generalized weakness    Lower Extremity Assessment Lower Extremity Assessment: Generalized weakness       Communication   Communication: No difficulties  Cognition Arousal/Alertness: Awake/alert Behavior During Therapy: Flat affect Overall Cognitive Status: Impaired/Different from baseline Area of Impairment: Problem solving;Awareness;Attention;Memory  Current Attention Level: Selective Memory: Decreased recall of precautions;Decreased short-term memory     Awareness: Intellectual Problem Solving: Slow processing;Requires verbal cues General Comments: pt requires encouragement and assist to try to move, but refusing to get up to stand      General Comments General comments (skin integrity, edema, etc.): pt was too fatigued to scoot up side of bed, required max assist to help scoot up to [facility]    Exercises     Assessment/Plan    PT Assessment Patient needs continued PT services  PT Problem List Decreased  strength;Decreased range of motion;Decreased activity tolerance;Decreased balance;Decreased mobility;Decreased coordination;Decreased cognition;Decreased knowledge of use of DME;Decreased safety awareness;Decreased knowledge of precautions;Cardiopulmonary status limiting activity       PT Treatment Interventions DME instruction;Gait training;Stair training;Functional mobility training;Therapeutic activities;Therapeutic exercise;Neuromuscular re-education;Balance training;Patient/family education    PT Goals (Current goals can be found in the Care Plan section)  Acute Rehab PT Goals Patient Stated Goal: to get home to family PT Goal Formulation: With patient Time For Goal Achievement: 11/09/19 Potential to Achieve Goals: Fair    Frequency Min 2X/week   Barriers to discharge Inaccessible home environment;Decreased caregiver support home alone with a step to enter    Co-evaluation               AM-PAC PT "6 Clicks" Mobility  Outcome Measure Help needed turning from your back to your side while in a flat bed without using bedrails?: A Little Help needed moving from lying on your back to sitting on the side of a flat bed without using bedrails?: A Lot Help needed moving to and from a bed to a chair (including a wheelchair)?: A Lot Help needed standing up from a chair using your arms (e.g., wheelchair or bedside chair)?: A Lot Help needed to walk in hospital room?: A Lot Help needed climbing 3-5 steps with a railing? : Total 6 Click Score: 12    End of Session   Activity Tolerance: Patient limited by fatigue;Treatment limited secondary to medical complications (Comment) Patient left: in bed;with call bell/phone within reach;with bed alarm set Nurse Communication: Mobility status PT Visit Diagnosis: Unsteadiness on feet (R26.81);Muscle weakness (generalized) (M62.81);Difficulty in walking, not elsewhere classified (R26.2)    Time: 0240-9735 PT Time Calculation (min) (ACUTE  ONLY): 24 min   Charges:   PT Evaluation $PT Eval Moderate Complexity: 1 Mod PT Treatments $Therapeutic Activity: 8-22 mins       Ramond Dial 10/26/2019, 11:02 PM  Mee Hives, PT MS Acute Rehab Dept. Number: [place]

## 2019-10-27 DIAGNOSIS — R918 Other nonspecific abnormal finding of lung field: Secondary | ICD-10-CM

## 2019-10-27 DIAGNOSIS — R638 Other symptoms and signs concerning food and fluid intake: Secondary | ICD-10-CM

## 2019-10-27 LAB — GLUCOSE, CAPILLARY
Glucose-Capillary: 90 mg/dL (ref 70–99)
Glucose-Capillary: 126 mg/dL — ABNORMAL HIGH (ref 70–99)
Glucose-Capillary: 129 mg/dL — ABNORMAL HIGH (ref 70–99)
Glucose-Capillary: 126 mg/dL — ABNORMAL HIGH (ref 70–99)

## 2019-10-27 LAB — LEGIONELLA PNEUMOPHILA SEROGP 1 UR AG: L. pneumophila Serogp 1 Ur Ag: NEGATIVE

## 2019-10-27 LAB — CBC
HCT: 23.1 % — ABNORMAL LOW (ref 36.0–46.0)
Hemoglobin: 8.2 g/dL — ABNORMAL LOW (ref 12.0–15.0)
MCH: 30.3 pg (ref 26.0–34.0)
MCHC: 35.5 g/dL (ref 30.0–36.0)
MCV: 85.2 fL (ref 80.0–100.0)
Platelets: 181 10*3/uL (ref 150–400)
RBC: 2.71 MIL/uL — ABNORMAL LOW (ref 3.87–5.11)
RDW: 16.7 % — ABNORMAL HIGH (ref 11.5–15.5)
WBC: 11.6 10*3/uL — ABNORMAL HIGH (ref 4.0–10.5)
nRBC: 0.3 % — ABNORMAL HIGH (ref 0.0–0.2)

## 2019-10-27 LAB — BASIC METABOLIC PANEL
Anion gap: 5 (ref 5–15)
BUN: 8 mg/dL (ref 8–23)
CO2: 25 mmol/L (ref 22–32)
Calcium: 6.8 mg/dL — ABNORMAL LOW (ref 8.9–10.3)
Chloride: 115 mmol/L — ABNORMAL HIGH (ref 98–111)
Creatinine, Ser: 1.2 mg/dL — ABNORMAL HIGH (ref 0.44–1.00)
GFR calc Af Amer: 54 mL/min — ABNORMAL LOW (ref >60)
GFR calc non Af Amer: 47 mL/min — ABNORMAL LOW (ref >60)
Glucose, Bld: 97 mg/dL (ref 70–99)
Potassium: 4.1 mmol/L (ref 3.5–5.1)
Sodium: 145 mmol/L (ref 135–145)

## 2019-10-27 LAB — MAGNESIUM: Magnesium: 1.6 mg/dL — ABNORMAL LOW (ref 1.7–2.4)

## 2019-10-27 MED ORDER — SODIUM CHLORIDE 0.9 % IV SOLN
1.0000 g | Freq: Three times a day (TID) | INTRAVENOUS | Status: AC
  Administered 2019-10-27 – 2019-10-29 (×8): 1 g via INTRAVENOUS
  Filled 2019-10-27 (×8): qty 1

## 2019-10-27 MED ORDER — MAGNESIUM SULFATE 50 % IJ SOLN
1.0000 g | INTRAVENOUS | Status: DC
  Filled 2019-10-27 (×2): qty 2

## 2019-10-27 MED ORDER — MAGNESIUM SULFATE 50 % IJ SOLN: 1.0000 g | INTRAMUSCULAR | Status: DC

## 2019-10-27 MED ORDER — MAGNESIUM SULFATE 2 GM/50ML IV SOLN
2.0000 g | Freq: Once | INTRAVENOUS | Status: AC
  Administered 2019-10-27: 2 g via INTRAVENOUS
  Filled 2019-10-27: qty 50

## 2019-10-27 MED ORDER — AZITHROMYCIN 500 MG PO TABS
250.0000 mg | ORAL_TABLET | Freq: Every day | ORAL | Status: DC
  Administered 2019-10-27 – 2019-10-28 (×2): 250 mg via ORAL
  Filled 2019-10-27 (×2): qty 1

## 2019-10-27 MED ORDER — SUCRALFATE 1 GM/10ML PO SUSP
1.0000 g | Freq: Three times a day (TID) | ORAL | Status: DC
  Administered 2019-10-27 – 2019-10-30 (×10): 1 g via ORAL
  Filled 2019-10-27 (×13): qty 10

## 2019-10-27 NOTE — Progress Notes (Signed)
[facility]  PROGRESS NOTE  [name] KGM:[id number] DOB: 13-May-1951 DOA: 10/22/2019 PCP: Ann Held, DO Admit date - 10/22/2019   Admitting Physician Etta Quill, DO  Outpatient Primary MD for the patient is Carollee Herter, Alferd Apa, DO  LOS - 5 Brief Narrative   [name] is a 69 y.o. year old female with medical history significant for stomach CA in remission, DM2, hypotension on midodrine, COVID 19 infection with no need for treatment but acute DVT (07/2019 now on Eliquis, and COVID who presented on 10/22/2019 with worsening abdominal pain was admitted with working diagnosis of UTI and concern for infectious colitis currently on IV meropenem.  Hospital Course: Initially empirically treated with meropenem for mild diffuse colitis in setting of recent ESBL UTI. On 4/2 ct chest for hypoxia (SpO 279% on 2 L)confirmed ground glass airspace disease that either representing multifocal pneumonia or postinflammatory scarring from recent COVID-19 infection (January 2021).  Patient was started on empiric vancomycin on 4/2 in addition to meropenem given positive MRSA swab and above findings  Notable history for recent hospitalizations:  1/14-1/22/2021 for generalized weakness and recurrent falls after recent hospitalization for Covid infection periumbilical rash, AKI on CKD due to dehydration/poor appetite/nausea and orthostatic hypotension responsive to IV fluids patient was discharged home, declining SNF recommendations by PT/OT  1/22/--08/20/2019 for further thrive/generalized weakness and physical deconditioning from recent hospitalizations was AKI on CKD related to dehydration that responded to IV fluids.  Patient was discharged to skilled nursing facility at the time  2/8-2/12/21 nausea, vomiting, decreased appetite with concern for esophageal dysmotility and gastric dysmotility given patient's history of stage Ib gastric cancer.  Found to have large amount of gastric fluid  retaining and recommended to have Reglan before meals  2/23-3/5 for septic shock requiring pressors in ICU from ESBL E. coli UTI and relative adrenal insufficiency and possible aspiration pneumonia requiring pressors.  Palliative discussions were initiated at that time to assist with [place] and symptom management. Family and patient reported they would reach out to palliative when they were ready to continue discussions    Subjective  Today states she is only oriented to self.  Confused on exam.  Denies any abdominal pain, states she is eating well without any nausea or vomiting.  Wants me to update her daughter.  A & P   Acute hypoxic respiratory failure with groundglass airspace disease.  Unclear if multifocal pneumonia or postinflammatory scarring from recent COVID-19 infection (January 2021, that did not require any treatment at the time) suspect scarring given no cough, decreasing O2 requirements, S pna and legionella neg, blood cx unremarkable.  Given MRSA swab was positive patient was added on vancomycin on 4/2.  Procalcitonin is elevated ( in setting of mild colitis) -Continue empiric vancomycin, meropenem, azithromycin--anticipate de-escalating to doxycycline in 24 hours if remains stable -Monitor blood cultures -Considering prednisone if this is not a infectious etiology.   -Encourage flutter valve, incentive spirometry -Encourage out of bed to chair, up in bed with assistance  Mild, diffuse colitis, concern for an infectious etiology   Given recent history of very severe UTI infection presume likely infectious.  Still only minimal p.o. intake, endorses low appetite, no nausea or vomiting. -IV meropenem -Monitor blood cultures -As needed Reglan -GIP pathogen panel, C. difficile if diarrhea starts  Wide QRS, 11 beats on telemetry (overnight 4/4), asymptomatic.  EKG shows no wide QRS.  Potassium within normal limits, magnesium 1.6.  Has preserved EF on TTE (09/02/2018).  Has history of  PVCs -IV magnesium, goal magnesium 2, -Monitor BMP, goal potassium 4 -Monitor on telemetry --on metoprolol 12.5 mg BID -Daily EKG given on Reglan, monitor QTC, currently 404)  Acute on Chronic hypotension, stable Chronic on previous hospital stays and related to poor nutrition. Concern for relative adrenal insufficiency in past but am cortisol wnl this hospital stay. SBP is stable now mid 90s.  Has not had any lactic acidosis.  Encourage p.o. intake, no longer requires IV fluids -Continue midodrine 10 mg 3 times daily -Continue to encourage oral intake  GERD/gastritis with significant esophageal dysmotility/oresbyesophgus without stricture based on recent barium swallow on 08/2019.  EGD on 08/2019 without stricture -GI recommended during previous hospital stay small boluses of food/bariatric diet from last hospitalization -Liberalize diet given diminished appetite -Encourage small bolus of fluid interspersed with liquids (previous recommendation by GI previous hospitalization) -scheduled Reglan -Continue PPI  Decreased oral intake, failure to thrive, protein calorie malnutrition.  Patient endorses diminished appetite since losing sense of taste/smell after COVID.  Has history of esophageal dysmotility without stricture, patient denies any dysphagia or odynophagia, no oral lesions on my examination.  Could mood be contributing. This has been a recurrent issue  -Continue scheduled Reglan, PPI, add carafate ( has worked well in the past) -Liberalize diet given diminished appetite -Nutrition consulted -Check prealbumin --Consider GI consult if doesn't improve with supportive care  AKI on CKD stage III, likely multifactorial related to infectious etiology, diminished p.o. intake related to above, solved.  Baseline creatinine is wide ranging ~ 1-1.6, 1.87 on admission and now back to baseline. Improved with IVF --Encourage oral intake, daily BMP, avoid nephrotoxins  Left lower leg pain and  swelling No longer having any swelling.  Seems MSK in nature or neuropathic related to DM. Improved on home tramadol --consider neuropathic agents if recurs -Left leg x-ray and venous duplex negative for acute fracture or DVT respectively -continue home pain medicine  Acute metabolic encephalopathy, resolved.  Bouts of confusion//visual hallucinations have decreased in frequency.  Suspect hospital-acquired delirium in the setting of infectious nidus. -Continue delirium precautions -Does not have any focal deficits, does not warrant head imaging  -Delirium precautions -avoid sedatives  Hyperkalemia, mild.  Peak potassium 5.4, resolved -continue monitoring BMP  History of ESBL E. coli UTI.  Completed previous antibiotic course.  UA here positive however patient denies any urinary symptoms. -urine cx shows ESBL E.coli similar to prior  Type 2 diabetes with peripheral neuropathy, A1c 5.6.  -Continue to closely monitor on sliding scale as needed -Continue gabapentin for mood  Anemia of chronic disease No signs symptoms of bleeding.  Hemoglobin at baseline -Daily CBC  History of CAD status post PCI stent in 08/2018, asymptomatic -Continue home Lopressor  Recent DVT, diagnosed 08/21/2019 -continue home Eliquis  Chronic pain syndrome -Continue home tramadol  Mood disorder, stable - continue zoloft  Reported worsening chronic blurry vision, worse on right eye Patient does have history of diabetes. MRI on 3/3 showed remote lacunar infarcts with no acute abnormalities -Has ophthalmology appointment as outpatient on 4/13  Generalized physical deconditioning. 5 hospitalization in last 4 months and in rehab facilities. Likely all due to acute illness. No focal deficits -PT/OT recommends SNF     Family Communication  : Updated daughter Virgene Tirone [phone] on 4/1, updated at bedside on 4/4  Code Status : Full  Disposition Plan :  Patient is from home. Anticipated d/c date: 2 to  3 days. Barriers to d/c or necessity for  inpatient status: On Iv vancomycin in addition to meropenem for presumed pneumonia, close monitoring of QTC/QRS/SVT, BP monitoring, need to increase PO intake Consults  : None  Procedures  : venous duplex  DVT Prophylaxis  : Eliquis  Lab Results  Component Value Date   PLT 181 10/27/2019    Diet :  Diet Order            Diet regular Room service appropriate? Yes; Fluid consistency: Thin  Diet effective now               Inpatient Medications Scheduled Meds: . apixaban  5 mg Oral BID  . azithromycin  250 mg Oral Daily  . Chlorhexidine Gluconate Cloth  6 each Topical Daily  . famotidine  10 mg Oral Daily  . gabapentin  300 mg Oral Daily   And  . gabapentin  600 mg Oral QHS  . insulin aspart  0-15 Units Subcutaneous TID WC  . insulin aspart  0-5 Units Subcutaneous QHS  . metoCLOPramide  5 mg Oral TID AC & HS  . metoprolol tartrate  12.5 mg Oral BID  . midodrine  10 mg Oral TID WC  . multivitamin with minerals  1 tablet Oral Daily  . mupirocin ointment   Nasal BID  . nystatin   Topical TID  . pantoprazole  40 mg Oral Daily  . rosuvastatin  20 mg Oral q1800  . sertraline  50 mg Oral Daily   Continuous Infusions: . meropenem (MERREM) IV 1 g (10/27/19 1007)  . vancomycin 1,250 mg (10/26/19 1459)   PRN Meds:.acetaminophen **OR** acetaminophen, albuterol, lidocaine-prilocaine, ondansetron **OR** ondansetron (ZOFRAN) IV, sodium chloride flush, sodium chloride flush, traMADol, traZODone  Antibiotics  :   Anti-infectives (From admission, onward)   Start     Dose/Rate Route Frequency Ordered Stop   10/27/19 1000  azithromycin (ZITHROMAX) tablet 250 mg     250 mg Oral Daily 10/27/19 0801 10/30/19 0959   10/27/19 1000  meropenem (MERREM) 1 g in sodium chloride 0.9 % 100 mL IVPB     1 g 200 mL/hr over 30 Minutes Intravenous Every 8 hours 10/27/19 0908     10/26/19 1000  azithromycin (ZITHROMAX) 250 mg in dextrose 5 % 125 mL IVPB   Status:  Discontinued     250 mg 125 mL/hr over 60 Minutes Intravenous Every 24 hours 10/25/19 1453 10/27/19 0801   10/25/19 1530  vancomycin (VANCOREADY) IVPB 1250 mg/250 mL     1,250 mg 166.7 mL/hr over 90 Minutes Intravenous Every 24 hours 10/25/19 1509     10/25/19 1500  azithromycin (ZITHROMAX) 500 mg in sodium chloride 0.9 % 250 mL IVPB     500 mg 250 mL/hr over 60 Minutes Intravenous  Once 10/25/19 1453 10/25/19 1900   10/23/19 0200  meropenem (MERREM) 1 g in sodium chloride 0.9 % 100 mL IVPB  Status:  Discontinued     1 g 200 mL/hr over 30 Minutes Intravenous Every 12 hours 10/23/19 0045 10/27/19 0908   10/23/19 0030  meropenem (MERREM) 1 g in sodium chloride 0.9 % 100 mL IVPB  Status:  Discontinued     1 g 200 mL/hr over 30 Minutes Intravenous  Once 10/23/19 0015 10/23/19 0044   10/22/19 2315  piperacillin-tazobactam (ZOSYN) IVPB 3.375 g  Status:  Discontinued     3.375 g 100 mL/hr over 30 Minutes Intravenous  Once 10/22/19 2307 10/22/19 2340       Objective   Vitals:   10/26/19 2146  10/26/19 2211 10/27/19 0530 10/27/19 0915  BP: (!) 82/56 103/70 99/65 (!) 97/57  Pulse: 83  91 94  Resp: 18  18 16   Temp: 98.3 F (36.8 C)  98.8 F (37.1 C) 98.8 F (37.1 C)  TempSrc: Oral  Oral Oral  SpO2: 99%  99% 98%  Weight: 100.8 kg     Height:        SpO2: 98 % O2 Flow Rate (L/min): 1 L/min  Wt Readings from Last 3 Encounters:  10/26/19 100.8 kg  09/27/19 98 kg  09/05/19 94.1 kg     Intake/Output Summary (Last 24 hours) at 10/27/2019 1354 Last data filed at 10/27/2019 0700 Gross per 24 hour  Intake 1165 ml  Output 1125 ml  Net 40 ml    Physical Exam:  Awake Alert, Oriented to self, place, context, time.  No distress. No focal deficits. Slow speech [place].AT, Moist oral mucosa, no oral lesions, poor dentition Normal respiratory effort on 1 L, no appreciable crackles or wheezing, decreased breath sounds at bases.  RRR,No Gallops,Rubs or new Murmurs,  Normal bowel  sounds, no tenderness or guarding No edema    I have personally reviewed the following:   Data Reviewed:  CBC Recent Labs  Lab 10/23/19 0315 10/24/19 0500 10/25/19 0452 10/26/19 0402 10/27/19 0326  WBC 10.8* 9.4 11.4* 11.2* 11.6*  HGB 8.7* 8.1* 9.0* 8.5* 8.2*  HCT 25.1* 23.2* 25.4* 24.3* 23.1*  PLT 216 207 217 200 181  MCV 87.8 86.9 86.1 87.1 85.2  MCH 30.4 30.3 30.5 30.5 30.3  MCHC 34.7 34.9 35.4 35.0 35.5  RDW 16.5* 16.2* 16.3* 16.8* 16.7*    Chemistries  Recent Labs  Lab 10/22/19 1756 10/22/19 1756 10/23/19 0315 10/24/19 0500 10/25/19 0452 10/26/19 0402 10/27/19 0326  NA 136   < > 140 144 142 142 145  K 5.2*   < > 5.4* 4.3 4.4 4.0 4.1  CL 102   < > 109 112* 109 110 115*  CO2 23   < > 22 24 23 23 25   GLUCOSE 111*   < > 83 78 137* 166* 97  BUN 16   < > 15 15 12 10 8   CREATININE 1.87*   < > 1.71* 1.51* 1.31* 1.24* 1.20*  CALCIUM 7.6*   < > 7.3* 6.9* 7.0* 6.7* 6.8*  MG  --   --   --   --   --   --  1.6*  AST 32  --   --   --   --   --   --   ALT 29  --   --   --   --   --   --   ALKPHOS 140*  --   --   --   --   --   --   BILITOT 1.2  --   --   --   --   --   --    < > = values in this interval not displayed.   ------------------------------------------------------------------------------------------------------------------ No results for input(s): CHOL, HDL, LDLCALC, TRIG, CHOLHDL, LDLDIRECT in the last 72 hours.  Lab Results  Component Value Date   HGBA1C 5.6 09/27/2019   ------------------------------------------------------------------------------------------------------------------ No results for input(s): TSH, T4TOTAL, T3FREE, THYROIDAB in the last 72 hours.  Invalid input(s): FREET3 ------------------------------------------------------------------------------------------------------------------ No results for input(s): VITAMINB12, FOLATE, FERRITIN, TIBC, IRON, RETICCTPCT in the last 72 hours.  Coagulation profile No results for input(s): INR,  PROTIME in the last 168 hours.  No results for  input(s): DDIMER in the last 72 hours.  Cardiac Enzymes No results for input(s): CKMB, TROPONINI, MYOGLOBIN in the last 168 hours.  Invalid input(s): CK ------------------------------------------------------------------------------------------------------------------    Component Value Date/Time   BNP 138.0 (H) 09/17/2019 1725    Micro Results Recent Results (from the past 240 hour(s))  Urine culture     Status: Abnormal   Collection Time: 10/22/19  8:01 PM   Specimen: Urine, Random  Result Value Ref Range Status   Specimen Description URINE, RANDOM  Final   Special Requests   Final    NONE Performed at [facility], [address], [place], [place] [address]    Culture (A)  Final    >=100,000 COLONIES/mL ESCHERICHIA COLI Confirmed Extended Spectrum Beta-Lactamase Producer (ESBL).  In bloodstream infections from ESBL organisms, carbapenems are preferred over piperacillin/tazobactam. They are shown to have a lower risk of mortality. 40,000 COLONIES/mL VANCOMYCIN RESISTANT ENTEROCOCCUS    Report Status 10/26/2019 FINAL  Final   Organism ID, Bacteria ESCHERICHIA COLI (A)  Final   Organism ID, Bacteria VANCOMYCIN RESISTANT ENTEROCOCCUS (A)  Final      Susceptibility   Escherichia coli - MIC*    AMPICILLIN >=32 RESISTANT Resistant     CEFAZOLIN >=64 RESISTANT Resistant     CEFTRIAXONE >=64 RESISTANT Resistant     CIPROFLOXACIN >=4 RESISTANT Resistant     GENTAMICIN <=1 SENSITIVE Sensitive     IMIPENEM <=0.25 SENSITIVE Sensitive     NITROFURANTOIN <=16 SENSITIVE Sensitive     TRIMETH/SULFA >=320 RESISTANT Resistant     AMPICILLIN/SULBACTAM 8 SENSITIVE Sensitive     PIP/TAZO <=4 SENSITIVE Sensitive     * >=100,000 COLONIES/mL ESCHERICHIA COLI   Vancomycin resistant enterococcus - MIC*    AMPICILLIN >=32 RESISTANT Resistant     NITROFURANTOIN 64 INTERMEDIATE Intermediate     VANCOMYCIN >=32 RESISTANT Resistant      LINEZOLID 2 SENSITIVE Sensitive     * 40,000 COLONIES/mL VANCOMYCIN RESISTANT ENTEROCOCCUS  SARS CORONAVIRUS 2 (TAT 6-24 HRS) Nasopharyngeal Nasopharyngeal Swab     Status: None   Collection Time: 10/23/19  2:19 AM   Specimen: Nasopharyngeal Swab  Result Value Ref Range Status   SARS Coronavirus 2 NEGATIVE NEGATIVE Final    Comment: (NOTE) SARS-CoV-2 target nucleic acids are NOT DETECTED. The SARS-CoV-2 RNA is generally detectable in upper and lower respiratory specimens during the acute phase of infection. Negative results do not preclude SARS-CoV-2 infection, do not rule out co-infections with other pathogens, and should not be used as the sole basis for treatment or other patient management decisions. Negative results must be combined with clinical observations, patient history, and epidemiological information. The expected result is Negative. Fact Sheet for Patients: SugarRoll.be Fact Sheet for Healthcare Providers: https://www.woods-mathews.com/ This test is not yet approved or cleared by the [place] FDA and  has been authorized for detection and/or diagnosis of SARS-CoV-2 by FDA under an Emergency Use Authorization (EUA). This EUA will remain  in effect (meaning this test can be used) for the duration of the COVID-19 declaration under Section 56 4(b)(1) of the Act, 21 U.S.C. section 360bbb-3(b)(1), unless the authorization is terminated or revoked sooner. Performed at [facility], [place] [address] [place], [place] [address]   MRSA PCR Screening     Status: Abnormal   Collection Time: 10/24/19  1:54 PM   Specimen: Nasopharyngeal  Result Value Ref Range Status   MRSA by PCR POSITIVE (A) NEGATIVE Final    Comment:  The GeneXpert MRSA Assay (FDA approved for NASAL specimens only), is one component of a comprehensive MRSA colonization surveillance program. It is not intended to diagnose MRSA infection nor to guide  or monitor treatment for MRSA infections. RESULT CALLED TO, READ BACK BY AND VERIFIED WITH: Garlon Hatchet RN 15:50 10/24/19 ([email or web address]) Performed at [facility], [place] [address] [place], [place] [address]   Culture, blood (Routine X 2) w Reflex to ID Panel     Status: None (Preliminary result)   Collection Time: 10/24/19  8:19 PM   Specimen: BLOOD  Result Value Ref Range Status   Specimen Description BLOOD RIGHT ANTECUBITAL  Final   Special Requests   Final    BOTTLES DRAWN AEROBIC AND ANAEROBIC Blood Culture adequate volume   Culture   Final    NO GROWTH 3 DAYS Performed at [facility], [place] [address] [place], [place] [address]    Report Status PENDING  Incomplete  Culture, blood (single)     Status: None (Preliminary result)   Collection Time: 10/25/19  7:20 AM   Specimen: BLOOD  Result Value Ref Range Status   Specimen Description BLOOD LEFT ANTECUBITAL  Final   Special Requests   Final    BOTTLES DRAWN AEROBIC AND ANAEROBIC Blood Culture adequate volume   Culture   Final    NO GROWTH 2 DAYS Performed at [facility], [place] [address], [place], [place] [address]    Report Status PENDING  Incomplete    Radiology Reports CT ABDOMEN PELVIS WO CONTRAST  Result Date: 10/22/2019 CLINICAL DATA:  Acute nonlocalized abdominal pain EXAM: CT ABDOMEN AND PELVIS WITHOUT CONTRAST TECHNIQUE: Multidetector CT imaging of the abdomen and pelvis was performed following the standard protocol without IV contrast. COMPARISON:  09/20/2019, 08/08/2019 FINDINGS: Lower chest: Patchy bilateral lower lobe ground-glass airspace disease demonstrates minimal improvement. There is now a small left pleural effusion. Hepatobiliary: Stable severe fatty infiltration of the liver. No focal abnormality on this unenhanced exam. The gallbladder is distended without evidence of cholelithiasis or cholecystitis. Pancreas: Diffuse atrophy, stable.  No focal abnormality. Spleen: Normal in size without focal  abnormality. Adrenals/Urinary Tract: No urinary tract calculi or obstructive uropathy. Mild left renal atrophy unchanged. Bladder is unremarkable. The adrenals are normal. Stomach/Bowel: There is mild diffuse colonic wall thickening which may be inflammatory or infectious. No bowel obstruction or ileus. 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 Vascular/Lymphatic: Aortic atherosclerosis. No enlarged abdominal or pelvic lymph nodes. Reproductive: Uterus and bilateral adnexa are unremarkable. Other: There is a small amount of free fluid within the pelvis, nonspecific. No free gas. Postsurgical changes are seen from previous midline laparotomy. Body wall edema is seen throughout the lower abdomen and pelvis. Musculoskeletal: Right hip arthroplasty is identified. No acute or destructive bony lesions. Reconstructed images demonstrate no additional findings. IMPRESSION: 1. Mild diffuse colonic wall thickening which may reflect inflammatory or infectious colitis. 2. Persistent bibasilar ground-glass airspace disease compatible with multifocal pneumonia. New small left effusion. 3. Small amount of free fluid within the pelvis, nonspecific. 4. Diffuse fatty infiltration of the liver. Electronically Signed   By: Randa Ngo M.D.   On: 10/22/2019 21:24   CT CHEST WO CONTRAST  Result Date: 10/25/2019 CLINICAL DATA:  Hypoxia, multifocal pneumonia on abdominal CT, history of COVID-13 August 2019 EXAM: CT CHEST WITHOUT CONTRAST TECHNIQUE: Multidetector CT imaging of the chest was performed following the standard protocol without IV contrast. COMPARISON:  10/24/2019, 10/22/2019, 11/02/2017 FINDINGS: Cardiovascular: The  heart is normal in size without pericardial effusion. There is severe atherosclerosis of the coronary vasculature. Mild atherosclerosis of the aortic arch. Thoracic aorta is normal in caliber. Mediastinum/Nodes: No pathologic adenopathy. Thyroid, trachea, and esophagus are stable.  Lungs/Pleura: There are patchy bilateral areas of ground-glass airspace disease. Overall, in comparison to abdominal CT 08/08/2019, there is only minimal interval improvement. Small bilateral pleural effusions have developed. No pneumothorax. The central airways are patent. Upper Abdomen: Stable fatty infiltration of the liver. Small amount of ascites now seen within the upper abdomen. Musculoskeletal: There is mild diffuse subcutaneous edema. No acute displaced fracture. Reconstructed images demonstrate no additional findings. IMPRESSION: 1. Persistent multifocal areas of ground-glass airspace disease. This could reflect persistent pneumonia or postinflammatory scarring, given history of COVID-19 infection January 2021. 2. Interval development of small bilateral pleural effusions, volume estimated less than 500 cc each. 3. Interval development of small volume ascites within the upper abdomen. 4. Fatty liver. 5. Aortic Atherosclerosis (ICD10-I70.0). Severe coronary atherosclerosis. Electronically Signed   By: Randa Ngo M.D.   On: 10/25/2019 17:11   DG CHEST PORT 1 VIEW  Result Date: 10/24/2019 CLINICAL DATA:  Hypoxia. EXAM: PORTABLE CHEST 1 VIEW COMPARISON:  Chest x-ray dated September 17, 2019. FINDINGS: Unchanged right chest wall port catheter. The heart size and mediastinal contours are within normal limits. Normal pulmonary vascularity. Low lung volumes. No focal consolidation, pleural effusion, or pneumothorax. No acute osseous abnormality. IMPRESSION: No active disease. Electronically Signed   By: Titus Dubin M.D.   On: 10/24/2019 17:05   DG HIP UNILAT WITH PELVIS 2-3 VIEWS LEFT  Result Date: 10/25/2019 CLINICAL DATA:  Hip pain, no injury EXAM: DG HIP (WITH OR WITHOUT PELVIS) 2-3V LEFT COMPARISON:  03/07/2019 FINDINGS: Status post right hip total arthroplasty. There is no evidence of hip fracture or dislocation. There is no evidence of arthropathy or other focal bone abnormality. IMPRESSION: 1.  No fracture or dislocation of the left hip. No significant arthrosis. 2.  Status post right hip total arthroplasty. Electronically Signed   By: Eddie Candle M.D.   On: 10/25/2019 12:46   VAS [place] LOWER EXTREMITY VENOUS (DVT)  Result Date: 10/24/2019  Lower Venous DVTStudy Indications: Swelling.  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 Anticoagulation: Eliquis. Limitations: Body habitus and poor ultrasound/tissue interface. Comparison Study: Left LEV 08-25-19, negative. Performing Technologist: Baldwin Crown ARDMS, RVT  Examination Guidelines: A complete evaluation includes B-mode imaging, spectral Doppler, color Doppler, and power Doppler as needed of all accessible portions of each vessel. Bilateral testing is considered an integral part of a complete examination. Limited examinations for reoccurring indications may be performed as noted. The reflux portion of the exam is performed with the patient in reverse Trendelenburg.  +-----+---------------+---------+-----------+----------+--------------+ RIGHTCompressibilityPhasicitySpontaneityPropertiesThrombus Aging +-----+---------------+---------+-----------+----------+--------------+ CFV  Full           Yes      Yes                                 +-----+---------------+---------+-----------+----------+--------------+   +---------+---------------+---------+-----------+----------+-----------------+ LEFT     CompressibilityPhasicitySpontaneityPropertiesThrombus Aging    +---------+---------------+---------+-----------+----------+-----------------+ CFV      Full           Yes      Yes                                    +---------+---------------+---------+-----------+----------+-----------------+ SFJ  Full                                                           +---------+---------------+---------+-----------+----------+-----------------+ FV Prox  Full                                                            +---------+---------------+---------+-----------+----------+-----------------+ FV Mid   Full                                                           +---------+---------------+---------+-----------+----------+-----------------+ FV DistalFull                                         poorly visualized +---------+---------------+---------+-----------+----------+-----------------+ PFV      Full                                                           +---------+---------------+---------+-----------+----------+-----------------+ POP      Full           Yes      Yes                                    +---------+---------------+---------+-----------+----------+-----------------+ PTV                                                   poorly visualized +---------+---------------+---------+-----------+----------+-----------------+ PERO                                                  poorly visualized +---------+---------------+---------+-----------+----------+-----------------+ Interstitial fluid seen in anterior distal lower extremity.    Summary: RIGHT: - No evidence of common femoral vein obstruction.  LEFT: - There is no evidence of deep vein thrombosis in the lower extremity. However, portions of this examination were limited- see technologist comments above.  - No cystic structure found in the popliteal fossa.  *See table(s) above for measurements and observations. Electronically signed by Monica Martinez MD on 10/24/2019 at 5:25:14 PM.    Final      Time Spent in minutes  30     Desiree Hane M.D on 10/27/2019 at 1:54 PM  To page go to [email or web address] - password [facility]

## 2019-10-27 NOTE — Progress Notes (Signed)
Pharmacy Antibiotic Note  [name] is a 69 y.o. female admitted on 10/22/2019 with nausea and abdominal pain.  Pharmacy has been consulted for meropenem and vancomycin dosing for ESBL E coli UTI and possible pneumonia.  Day 5 meropenem Day 3 vancomycin Day 3 azith  She is afebrile, WBC slightly elevated at 11.6 and remain stable since admit, PCT 1.25, and renal function is stable.   Plan: Continue vancomycin 1250 mg IV q24h Expected AUC: 502 SCr used: 1.31 Goal AUC 400-550 Increase meropenem to 1 g IV q8h F/U LOT Monitor renal function, C&S and vanc levels as indicated  Height: 5' 6"  (167.6 cm) Weight: 100.8 kg (222 lb 3.6 oz) IBW/kg (Calculated) : 59.3  Temp (24hrs), Avg:98.7 F (37.1 C), Min:98.3 F (36.8 C), Max:99 F (37.2 C)  Recent Labs  Lab 10/23/19 0315 10/23/19 1411 10/23/19 1833 10/24/19 0500 10/25/19 0452 10/26/19 0402 10/27/19 0326  WBC 10.8*  --   --  9.4 11.4* 11.2* 11.6*  CREATININE 1.71*  --   --  1.51* 1.31* 1.24* 1.20*  LATICACIDVEN  --  1.4 1.3  --   --   --   --     Estimated Creatinine Clearance: 54.5 mL/min (A) (by C-G formula based on SCr of 1.2 mg/dL (H)).    Allergies  Allergen Reactions  . Cephalexin Itching, Rash and Other (See Comments)    Sensation of throat closing. Tolerated pip/tazo, cefepime, ceftriaxone before   . Adhesive [Tape] Other (See Comments)    Burn Skin  . Codeine Other (See Comments)    paranoid  . Zocor [Simvastatin - High Dose] Other (See Comments)    Muscle spasms    Meropenem 3/31>> Vancomycin 4/2>> Azith 4/2>>  3/31 UCx: >100 K ESBL E coli (S-imi, Zosyn, Unasyn), 40K VRE (S-linezolid) 3/31 covid: neg 4/1 MRSA PCR + 4/2 BCx: ngtd 4/3 Strep pneumo Ag neg 4/3 Legionella PCR:  4/3 GI PCR:  Thank you for involving pharmacy in this patient's care.  Renold Genta, PharmD, BCPS Clinical Pharmacist Clinical phone for 10/27/2019 until 3p is [phone] 10/27/2019 8:51 AM  **Pharmacist phone directory can be  found on [email or web address] listed under [place]**

## 2019-10-27 NOTE — Progress Notes (Signed)
RN received a call from tele stating that pt had 11 beats of wide QRS. Rhythm is now back to normal sinus. Pt is resting comfortably in bed at this time. RN messaged NP on call to make aware.   Eleanora Neighbor, RN

## 2019-10-27 NOTE — NC FL2 (Signed)
[place] MEDICAID FL2 LEVEL OF CARE SCREENING TOOL     IDENTIFICATION  Patient Name: [name] Birthdate: 04/02/1951 Sex: female Admission Date (Current Location): 10/22/2019  [facility] and [place] Number:  Herbalist and Address:  [facility], [place] [address], [place], [place] [address]      Provider Number: [id number]  Attending Physician Name and Address:  Desiree Hane, MD  Relative Name and Phone Number:  [name] [phone]    Current Level of Care: Hospital Recommended Level of Care: [place] Prior Approval Number:    Date Approved/Denied:   PASRR Number: Pending  Discharge Plan: SNF    Current Diagnoses: Patient Active Problem List   Diagnosis Date Noted  . Decreased oral intake 10/27/2019  . Ground glass opacity present on imaging of lung 10/26/2019  . Acute respiratory failure with hypoxia ([place]) 10/26/2019  . Hypoxia 10/25/2019  . Acute metabolic encephalopathy 07/62/2633  . UTI due to extended-spectrum beta lactamase (ESBL) producing Escherichia coli 10/22/2019  . Goals of care, counseling/discussion   . Palliative care by specialist   . DNR (do not resuscitate) discussion   . Pressure injury of skin 09/18/2019  . Elevated lactic acid level   . Sepsis due to gram-negative UTI ([place]) 09/04/2019  . Urinary tract infection due to extended-spectrum beta lactamase (ESBL) producing Escherichia coli 09/04/2019  . Acute lower UTI 09/02/2019  . History of DVT (deep vein thrombosis) 09/02/2019  . Drug-induced polyneuropathy ([place]) 08/18/2019  . FTT (failure to thrive) in adult 08/17/2019  . AKI (acute kidney injury) ([place]) 08/17/2019  . SOB (shortness of breath) 08/16/2019  . Lower extremity edema 08/16/2019  . Fall 08/08/2019  . Hypertension 08/08/2019  . COVID-19 virus infection 07/25/2019  . Elevated LFTs 07/25/2019  . COVID-19 07/25/2019  . Allergic contact dermatitis due to adhesives 05/23/2019  .  Insomnia 05/23/2019  . SIRS (systemic inflammatory response syndrome) ([place]) 05/14/2019  . NSTEMI (non-ST elevated myocardial infarction) ([place]) 05/14/2019  . Hyperglycemia   . Neutropenia ([place])   . CAD (coronary artery disease)   . Obesity with serious comorbidity 04/16/2019  . Colitis 03/27/2019  . Falls frequently 03/27/2019  . Acute gastroenteritis 03/22/2019  . Hematoma of scalp 03/14/2019  . Vitamin D deficiency 11/09/2018  . Muscle spasm 10/19/2018  . Chest pain 09/07/2018  . Palpitations 09/07/2018  . Other fatigue 09/07/2018  . Acute ST elevation myocardial infarction (STEMI) of inferior wall ([place]) 08/27/2018  . Atherosclerotic heart disease of native coronary artery with other forms of angina pectoris ([place]) 08/27/2018  . Acute vaginitis 08/20/2018  . Hyperlipidemia associated with type 2 diabetes mellitus ([place]) 04/09/2018  . Bronchitis 11/02/2017  . Sepsis ([place]) 11/02/2017  . Leucocytosis 11/02/2017  . Moderate persistent asthma with acute exacerbation 05/26/2017  . Influenza A 05/26/2017  . Acute pain of right shoulder 03/07/2017  . Acute bilateral low back pain without sciatica 03/07/2017  . Chronic pain 03/07/2017  . Fatty liver 03/24/2016  . Weakness 03/23/2016  . Unexplained weight loss 03/23/2016  . Melena 03/23/2016  . Hypotension 02/03/2016  . Syncope 02/03/2016  . Hypomagnesemia 02/03/2016  . Acute renal failure superimposed on stage 3 chronic kidney disease ([place]) 02/03/2016  . Gastrointestinal dysmotility 01/20/2016  . CKD (chronic kidney disease) stage 3, GFR 30-59 ml/min (HCC) 01/20/2016  . Abscess of right thigh 01/20/2016  . Chronic pain syndrome 01/15/2016  . Diarrhea 01/15/2016  . Generalized abdominal pain 01/14/2016  . Type 2 diabetes with  nephropathy ([place]) 12/30/2015  . TIA (transient ischemic attack) 07/2015  . Morbid obesity due to excess calories ([place]) 05/05/2015  . Diabetic polyneuropathy associated with type 2 diabetes mellitus ([place])  01/02/2015  . Acute bacterial sinusitis 01/02/2015  . Onychomycosis 11/04/2014  . Pain in lower limb 11/04/2014  . Multinodular goiter (nontoxic) 10/16/2014  . Iron deficiency anemia 05/20/2014  . Major depressive disorder, single episode, moderate ([place]) 05/20/2014  . Dizziness of unknown cause 04/28/2014  . MDD (major depressive disorder), recurrent episode, moderate ([place]) 01/28/2014  . GERD (gastroesophageal reflux disease) 01/02/2014  . Nausea 10/15/2012  . Stomach cancer ([place]) 12/10/2008  . CHRONIC RESPIRATORY FAILURE 01/03/2008  . Hyperlipidemia 06/27/2007  . OBESITY, MORBID 06/27/2007  . SLEEP APNEA, OBSTRUCTIVE 06/27/2007  . Generalized idiopathic epilepsy and epileptic syndromes, not intractable, with status epilepticus ([place]) 06/27/2007  . Asthma 06/27/2007  . OSTEOARTHRITIS  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 06/27/2007    Orientation RESPIRATION BLADDER Height & Weight     Self, Place  Normal Incontinent, External catheter Weight: 222 lb 3.6 oz (100.8 kg) Height:  5' 6"  (167.6 cm)  BEHAVIORAL SYMPTOMS/MOOD NEUROLOGICAL BOWEL NUTRITION STATUS      Incontinent Diet(See discharge summary)  AMBULATORY STATUS COMMUNICATION OF NEEDS Skin   Extensive Assist   Normal                       Personal Care Assistance Level of Assistance  Bathing, Feeding, Dressing Bathing Assistance: Maximum assistance Feeding assistance: Independent Dressing Assistance: Maximum assistance     Functional Limitations Info  Hearing, Sight, Speech Sight Info: Adequate Hearing Info: Adequate Speech Info: Adequate    SPECIAL CARE FACTORS FREQUENCY  PT (By licensed PT), OT (By licensed OT)     PT Frequency: 5x a week OT Frequency: 5x a week            Contractures Contractures Info: Not present    Additional Factors Info  Code Status, Allergies Code Status Info: Full Allergies Info: Cephalexin; Adhesive (tape); Codeine; Zocor (simvastatin - High Dose)           Current  Medications (10/27/2019):  This is the current hospital active medication list Current Facility-Administered Medications  Medication Dose Route Frequency Provider Last Rate Last Admin  . acetaminophen (TYLENOL) tablet 650 mg  650 mg Oral Q6H PRN Etta Quill, DO   650 mg at 10/25/19 1452   Or  . acetaminophen (TYLENOL) suppository 650 mg  650 mg Rectal Q6H PRN Etta Quill, DO      . albuterol (PROVENTIL) (2.5 MG/3ML) 0.083% nebulizer solution 2.5 mg  2.5 mg Nebulization Q6H PRN Etta Quill, DO      . apixaban [name]) tablet 5 mg  5 mg Oral BID Jennette Kettle M, DO   5 mg at 10/27/19 0959  . azithromycin (ZITHROMAX) tablet 250 mg  250 mg Oral Daily Oretha Milch D, MD   250 mg at 10/27/19 0959  . Chlorhexidine Gluconate Cloth 2 % PADS 6 each  6 each Topical Daily Etta Quill, DO   6 each at 10/27/19 1007  . famotidine (PEPCID) tablet 10 mg  10 mg Oral Daily Oretha Milch D, MD   10 mg at 10/27/19 0959  . gabapentin (NEURONTIN) capsule 300 mg  300 mg Oral Daily Jennette Kettle M, DO   300 mg at 10/27/19 6761   And  . gabapentin (NEURONTIN) capsule 600 mg  600 mg Oral QHS Alcario Drought,  Toy Care, DO   600 mg at 10/26/19 2222  . insulin aspart (novoLOG) injection 0-15 Units  0-15 Units Subcutaneous TID WC Etta Quill, DO   2 Units at 10/27/19 1203  . insulin aspart (novoLOG) injection 0-5 Units  0-5 Units Subcutaneous QHS Alcario Drought, Jared M, DO      . lidocaine-prilocaine (EMLA) cream   Topical PRN Oretha Milch D, MD      . meropenem (MERREM) 1 g in sodium chloride 0.9 % 100 mL IVPB  1 g Intravenous Q8H Renold Genta D, RPH 200 mL/hr at 10/27/19 1007 1 g at 10/27/19 1007  . metoCLOPramide (REGLAN) tablet 5 mg  5 mg Oral TID AC & HS Oretha Milch D, MD   5 mg at 10/27/19 1159  . metoprolol tartrate (LOPRESSOR) tablet 12.5 mg  12.5 mg Oral BID Gardiner Barefoot, NP   12.5 mg at 10/27/19 0958  . midodrine (PROAMATINE) tablet 10 mg  10 mg Oral TID WC Jennette Kettle M, DO   10 mg  at 10/27/19 1159  . multivitamin with minerals tablet 1 tablet  1 tablet Oral Daily Etta Quill, DO   1 tablet at 10/27/19 [id number]  . mupirocin ointment (BACTROBAN) 2 %   Nasal BID Desiree Hane, MD   Given at 10/27/19 1007  . nystatin (MYCOSTATIN/NYSTOP) topical powder   Topical TID Etta Quill, DO   Given at 10/27/19 1007  . ondansetron (ZOFRAN) tablet 4 mg  4 mg Oral Q6H PRN Etta Quill, DO       Or  . ondansetron [facility]) injection 4 mg  4 mg Intravenous Q6H PRN Etta Quill, DO      . pantoprazole (PROTONIX) EC tablet 40 mg  40 mg Oral Daily Jennette Kettle M, DO   40 mg at 10/27/19 0959  . rosuvastatin (CRESTOR) tablet 20 mg  20 mg Oral q1800 Jennette Kettle M, DO   20 mg at 10/26/19 1738  . sertraline (ZOLOFT) tablet 50 mg  50 mg Oral Daily Jennette Kettle M, DO   50 mg at 10/27/19 0959  . sodium chloride flush (NS) 0.9 % injection 10-40 mL  10-40 mL Intracatheter PRN Etta Quill, DO   10 mL at 10/26/19 0410  . sodium chloride flush (NS) 0.9 % injection 10-40 mL  10-40 mL Intracatheter PRN Oretha Milch D, MD      . sucralfate (CARAFATE) 1 GM/10ML suspension 1 g  1 g Oral TID WC & HS Oretha Milch D, MD      . traMADol (ULTRAM) tablet 50 mg  50 mg Oral Q8H PRN Etta Quill, DO   50 mg at 10/24/19 1159  . traZODone (DESYREL) tablet 25 mg  25 mg Oral QHS PRN Etta Quill, DO      . vancomycin (VANCOREADY) IVPB 1250 mg/250 mL  1,250 mg Intravenous Q24H Oretha Milch D, MD 166.7 mL/hr at 10/27/19 1442 1,250 mg at 10/27/19 1442   Facility-Administered Medications Ordered in Other Encounters  Medication Dose Route Frequency Provider Last Rate Last Admin  . sodium chloride flush (NS) 0.9 % injection 10 mL  10 mL Intravenous PRN [place], Sarah M, NP   10 mL at 09/13/16 1146     Discharge Medications: Please see discharge summary for a list of discharge medications.  Relevant Imaging Results:  Relevant Lab Results:   Additional Information SSN  [id number]  Eniyah Eastmond J [place], [place]

## 2019-10-28 LAB — CBC
HCT: 23.2 % — ABNORMAL LOW (ref 36.0–46.0)
Hemoglobin: 8.3 g/dL — ABNORMAL LOW (ref 12.0–15.0)
MCH: 30 pg (ref 26.0–34.0)
MCHC: 35.8 g/dL (ref 30.0–36.0)
MCV: 83.8 fL (ref 80.0–100.0)
Platelets: 184 10*3/uL (ref 150–400)
RBC: 2.77 MIL/uL — ABNORMAL LOW (ref 3.87–5.11)
RDW: 16.7 % — ABNORMAL HIGH (ref 11.5–15.5)
WBC: 12.6 10*3/uL — ABNORMAL HIGH (ref 4.0–10.5)
nRBC: 0.2 % (ref 0.0–0.2)

## 2019-10-28 LAB — BASIC METABOLIC PANEL
Anion gap: 7 (ref 5–15)
BUN: 10 mg/dL (ref 8–23)
CO2: 24 mmol/L (ref 22–32)
Calcium: 6.6 mg/dL — ABNORMAL LOW (ref 8.9–10.3)
Chloride: 113 mmol/L — ABNORMAL HIGH (ref 98–111)
Creatinine, Ser: 1.25 mg/dL — ABNORMAL HIGH (ref 0.44–1.00)
GFR calc Af Amer: 52 mL/min — ABNORMAL LOW (ref >60)
GFR calc non Af Amer: 44 mL/min — ABNORMAL LOW (ref >60)
Glucose, Bld: 140 mg/dL — ABNORMAL HIGH (ref 70–99)
Potassium: 4 mmol/L (ref 3.5–5.1)
Sodium: 144 mmol/L (ref 135–145)

## 2019-10-28 LAB — MAGNESIUM: Magnesium: 1.9 mg/dL (ref 1.7–2.4)

## 2019-10-28 LAB — GLUCOSE, CAPILLARY
Glucose-Capillary: 125 mg/dL — ABNORMAL HIGH (ref 70–99)
Glucose-Capillary: 111 mg/dL — ABNORMAL HIGH (ref 70–99)
Glucose-Capillary: 164 mg/dL — ABNORMAL HIGH (ref 70–99)
Glucose-Capillary: 142 mg/dL — ABNORMAL HIGH (ref 70–99)

## 2019-10-28 LAB — PREALBUMIN: Prealbumin: <5 mg/dL — ABNORMAL LOW (ref 18–38)

## 2019-10-28 MED ORDER — METOCLOPRAMIDE HCL 5 MG PO TABS: 5.0000 mg | ORAL_TABLET | Freq: Three times a day (TID) | ORAL | Status: DC | PRN

## 2019-10-28 MED ORDER — BIOTENE DRY MOUTH MT LIQD: 15.0000 mL | OROMUCOSAL | Status: DC | PRN

## 2019-10-28 MED ORDER — BIOTENE DRY MOUTH MT LIQD
15.0000 mL | Freq: Three times a day (TID) | OROMUCOSAL | Status: DC
  Administered 2019-10-28 – 2019-10-30 (×2): 15 mL via OROMUCOSAL

## 2019-10-28 MED ORDER — ENSURE ENLIVE PO LIQD
237.0000 mL | Freq: Three times a day (TID) | ORAL | Status: DC
  Administered 2019-10-28: 237 mL via ORAL

## 2019-10-28 MED ORDER — DOXYCYCLINE HYCLATE 100 MG PO TABS
100.0000 mg | ORAL_TABLET | Freq: Two times a day (BID) | ORAL | Status: DC
  Administered 2019-10-28 – 2019-10-30 (×6): 100 mg via ORAL
  Filled 2019-10-28 (×6): qty 1

## 2019-10-28 NOTE — Consult Note (Signed)
   [facility] Inpatient Consult   10/28/2019  [name] Dec 11, 1950 [id number]   Patient was assessed for [facility] for community services.  Patient is also has a less than 30 day hospital readmission. Patient with [employer] Medicare in the [place] [employer] [ACO].   Patient was previously active with [facility].  Chart review reveals patient is currently planned for returning to a skilled nursing facility level of care for rehab.  Plan: No currently needs for [facility] at this time for care management services while in [facility].  Of note, [facility] Care Management services does not replace or interfere with any services that are arranged by inpatient [facility] care management team.   For additional questions or referrals please contact:  [name], RN BSN [facility] Liaison  [phone] business mobile phone Toll free office [phone]  Fax number: [phone] [name]

## 2019-10-28 NOTE — Progress Notes (Signed)
Initial Nutrition Assessment  RD working remotely.  DOCUMENTATION CODES:   Obesity unspecified  INTERVENTION:   -Ensure Enlive po TID, each supplement provides 350 kcal and 20 grams of protein -Magic cup TID with meals, each supplement provides 290 kcal and 9 grams of protein -MVI with minerals daily  NUTRITION DIAGNOSIS:   Inadequate oral intake related to altered GI function, decreased appetite as evidenced by meal completion < 50%.  GOAL:   Patient will meet greater than or equal to 90% of their needs  MONITOR:   PO intake, Supplement acceptance, Weight trends, Skin, I & O's  REASON FOR ASSESSMENT:   Consult Poor PO  ASSESSMENT:   [name] is a 69 y.o. female with medical history significant of stomach CA in remission, DM2, hypotension on midodrine.  Pt admitted with mild diffuse colitis.   Reviewed I/O's: +730 ml x 24 hours and +7.5 L since admission  UOP: 0 ml x 24 hours  Attempted to speak with pt via phone, however, no answer.   Per chart review, pt alert to self. She claims to have a good appetite, however, meal completion documented at 0-50%. Pt would greatly benefit from addition of oral nutrition supplements.   Reviewed wt hx; pt wt has been stable over the past year.  Medications reviewed and include reglan.   Labs reviewed: CBGS: 111-129 (inpatient orders for glycemic control are 0-15 units insulin aspart TID with meals, 0-5 units insulin aspart q HS,   Diet Order:   Diet Order            Diet regular Room service appropriate? Yes; Fluid consistency: Thin  Diet effective now              EDUCATION NEEDS:   No education needs have been identified at this time  Skin:  Skin Assessment: Skin Integrity Issues: Skin Integrity Issues:: Other (Comment) Other: MASD bilateral breasts and groin  Last BM:  10/27/19  Height:   Ht Readings from Last 1 Encounters:  10/23/19 5' 6"  (1.676 m)    Weight:   Wt Readings from Last 1 Encounters:   10/27/19 100.8 kg    Ideal Body Weight:  59.1 kg  BMI:  Body mass index is 35.87 kg/m.  Estimated Nutritional Needs:   Kcal:  1850-2050  Protein:  105-120 grams  Fluid:  > 1.8 L    Loistine Chance, RD, LDN, [place] Registered Dietitian II Certified Diabetes Care and Education Specialist Please refer to [facility] for RD and/or RD on-call/weekend/after hours pager

## 2019-10-28 NOTE — Progress Notes (Signed)
[facility]  PROGRESS NOTE  [name] CXK:[id number] DOB: 1951-02-17 DOA: 10/22/2019 PCP: Jessica Held, DO Admit date - 10/22/2019   Admitting Physician Jessica Quill, DO  Outpatient Primary MD for the patient is [name], Jessica Apa, DO  LOS - 6 Brief Narrative   [name] is a 69 y.o. year old female with medical history significant for stomach CA in remission, DM2, hypotension on midodrine, COVID 19 infection with no need for treatment but acute DVT (07/2019 now on Eliquis, and COVID who presented on 10/22/2019 with worsening abdominal pain was admitted with working diagnosis of UTI and concern for infectious colitis currently on IV meropenem.  Hospital Course: Initially empirically treated with meropenem for mild diffuse colitis in setting of recent ESBL UTI. On 4/2 ct chest for hypoxia (SpO 279% on 2 L)confirmed ground glass airspace disease that either representing multifocal pneumonia or postinflammatory scarring from recent COVID-19 infection (January 2021).  Patient was started on empiric vancomycin on 4/2 in addition to meropenem given positive MRSA swab and above findings  Notable history for recent hospitalizations:  1/14-1/22/2021 for generalized weakness and recurrent falls after recent hospitalization for Covid infection periumbilical rash, AKI on CKD due to dehydration/poor appetite/nausea and orthostatic hypotension responsive to IV fluids patient was discharged home, declining SNF recommendations by PT/OT  1/22/--08/20/2019 for further thrive/generalized weakness and physical deconditioning from recent hospitalizations was AKI on CKD related to dehydration that responded to IV fluids.  Patient was discharged to skilled nursing facility at the time  2/8-2/12/21 nausea, vomiting, decreased appetite with concern for esophageal dysmotility and gastric dysmotility given patient's history of stage Ib gastric cancer.  Found to have large amount of gastric fluid  retaining and recommended to have Reglan before meals  2/23-3/5 for septic shock requiring pressors in ICU from ESBL E. coli UTI and relative adrenal insufficiency and possible aspiration pneumonia requiring pressors.  Palliative discussions were initiated at that time to assist with [place] and symptom management. Family and patient reported they would reach out to palliative when they were ready to continue discussions    Subjective  Today she is back at her mental status baseline alert and oriented x4.  Reports she ate some of her daughter's food yesterday.  Denies any cough, no shortness of breath, no abdominal pain, no nausea, no vomiting. Complains of dry mouth which is chronic which has been response to biotin in the past. Also complains of tingling in all hands for which she takes gabapentin  A & P   Acute hypoxic respiratory failure with groundglass airspace disease.  Unclear if multifocal pneumonia or postinflammatory scarring from recent COVID-19 infection (January 2021, that did not require any treatment at the time) suspect scarring given no cough, decreasing O2 requirements, S pna and legionella neg, blood cx unremarkable.  Given MRSA swab was positive patient was added on vancomycin on 4/2.  Procalcitonin is elevated ( in setting of mild colitis) -De-escalating to doxycycline  -Monitor blood cultures -Considering prednisone if this is not a infectious etiology.   -Encourage flutter valve, incentive spirometry -Encourage out of bed to chair, up in bed with assistance  Mild, diffuse colitis, concern for an infectious etiology   Given recent history of very severe UTI infection presume likely infectious.  Still only minimal p.o. intake, endorses low appetite, no nausea or vomiting. -IV meropenem -Monitor blood cultures -As needed Reglan -GIP pathogen panel, C. difficile if diarrhea starts  NSVT on telemetry , asymptomatic.  EKG  shows no wide QRS.  Potassium within normal limits, s/p  mg repletion on 4/4.  Has preserved EF on TTE (09/02/2018). Has history of PVCs -daily mg, goal magnesium 2, -Monitor BMP, goal potassium 4 -Monitor on telemetry --on metoprolol 12.5 mg BID -change to PRN reglan  Acute on Chronic hypotension, stable Chronic on previous hospital stays and related to poor nutrition. Concern for relative adrenal insufficiency in past but am cortisol wnl this hospital stay. SBP is stable now mid 90s-110s.  Has not had any lactic acidosis.   --Encourage p.o. intake, no longer requires IV fluids -Continue midodrine 10 mg 3 times daily -Continue to encourage oral intake  GERD/gastritis with significant esophageal dysmotility/oresbyesophgus without stricture based on recent barium swallow on 08/2019.  EGD on 08/2019 without stricture -GI recommended during previous hospital stay small boluses of food/bariatric diet from last hospitalization -Liberalize diet given diminished appetite -Encourage small bolus of fluid interspersed with liquids (previous recommendation by GI previous hospitalization) -PRN Reglan -Continue PPI  Decreased oral intake, failure to thrive, protein calorie malnutrition.  Patient endorses diminished appetite since losing sense of taste/smell after COVID.  Has history of esophageal dysmotility without stricture, patient denies any dysphagia or odynophagia, no oral lesions on my examination.  Could mood be contributing? This has been a recurrent issue  -PRN Reglan, PPI, add carafate ( has worked well in the past) -Liberalize diet given diminished appetite -Nutrition consulted, prealbumin < 5 --Add bioten, has helped in the past per patient and family  AKI on CKD stage III, likely multifactorial related to infectious etiology, diminished p.o. intake related to above, solved.  Baseline creatinine is wide ranging ~ 1-1.6, 1.87 on admission and now back to baseline. Improved with IVF --Encourage oral intake, daily BMP, avoid nephrotoxins  Left lower  leg pain and swelling, resolved No longer having any swelling.  Seems MSK in nature or neuropathic related to DM. Improved on home tramadol --consider neuropathic agents if recurs -Left leg x-ray and venous duplex negative for acute fracture or DVT respectively -continue home pain medicine  Acute metabolic encephalopathy, resolved.  Bouts of confusion//visual hallucinations have decreased in frequency.  Suspect hospital-acquired delirium in the setting of infectious nidus. -Continue delirium precautions -Does not have any focal deficits, does not warrant head imaging  -Delirium precautions -avoid sedatives  Hyperkalemia, mild.  Peak potassium 5.4, resolved -continue monitoring BMP  History of ESBL E. coli UTI.  Completed previous antibiotic course.  UA here positive however patient denies any urinary symptoms. -urine cx shows ESBL E.coli similar to prior  Type 2 diabetes with peripheral neuropathy, A1c 5.6.  -Continue to closely monitor on sliding scale as needed -Continue gabapentin for mood  Anemia of chronic disease No signs symptoms of bleeding.  Hemoglobin at baseline -Daily CBC  History of CAD status post PCI stent in 08/2018, asymptomatic -Continue home Lopressor  Recent DVT, diagnosed 08/21/2019 -continue home Eliquis  Chronic pain syndrome -Continue home tramadol  Mood disorder, stable - continue zoloft  Reported worsening chronic blurry vision, worse on right eye Patient does have history of diabetes. MRI on 3/3 showed remote lacunar infarcts with no acute abnormalities -Has ophthalmology appointment as outpatient on 4/13  Generalized physical deconditioning. 5 hospitalization in last 4 months and in rehab facilities. Likely all due to acute illness. No focal deficits -PT/OT recommends SNF     Family Communication  : Updated daughter Shirel Mallis [phone] on 4/1, updated at bedside on 4/4  Code Status : Full  Disposition Plan :  Patient is from home.  Anticipated d/c date: 2 to 3 days. Barriers to d/c or necessity for inpatient status: On Iv  meropenem for colitis,  need to increase PO intake Consults  : None  Procedures  : venous duplex  DVT Prophylaxis  : Eliquis  Lab Results  Component Value Date   PLT 184 10/28/2019    Diet :  Diet Order            Diet regular Room service appropriate? Yes; Fluid consistency: Thin  Diet effective now               Inpatient Medications Scheduled Meds: . apixaban  5 mg Oral BID  . Chlorhexidine Gluconate Cloth  6 each Topical Daily  . doxycycline  100 mg Oral Q12H  . famotidine  10 mg Oral Daily  . feeding supplement (ENSURE ENLIVE)  237 mL Oral TID BM  . gabapentin  300 mg Oral Daily   And  . gabapentin  600 mg Oral QHS  . insulin aspart  0-15 Units Subcutaneous TID WC  . insulin aspart  0-5 Units Subcutaneous QHS  . metoCLOPramide  5 mg Oral TID AC & HS  . metoprolol tartrate  12.5 mg Oral BID  . midodrine  10 mg Oral TID WC  . multivitamin with minerals  1 tablet Oral Daily  . mupirocin ointment   Nasal BID  . nystatin   Topical TID  . pantoprazole  40 mg Oral Daily  . rosuvastatin  20 mg Oral q1800  . sertraline  50 mg Oral Daily  . sucralfate  1 g Oral TID WC & HS   Continuous Infusions: . meropenem (MERREM) IV 1 g (10/28/19 1031)   PRN Meds:.acetaminophen **OR** acetaminophen, albuterol, antiseptic oral rinse, lidocaine-prilocaine, ondansetron **OR** ondansetron (ZOFRAN) IV, sodium chloride flush, sodium chloride flush, traMADol, traZODone  Antibiotics  :   Anti-infectives (From admission, onward)   Start     Dose/Rate Route Frequency Ordered Stop   10/28/19 1300  doxycycline (VIBRA-TABS) tablet 100 mg     100 mg Oral Every 12 hours 10/28/19 1250     10/27/19 1000  azithromycin (ZITHROMAX) tablet 250 mg  Status:  Discontinued     250 mg Oral Daily 10/27/19 0801 10/28/19 1257   10/27/19 1000  meropenem (MERREM) 1 g in sodium chloride 0.9 % 100 mL IVPB     1 g 200  mL/hr over 30 Minutes Intravenous Every 8 hours 10/27/19 0908 10/29/19 2359   10/26/19 1000  azithromycin (ZITHROMAX) 250 mg in dextrose 5 % 125 mL IVPB  Status:  Discontinued     250 mg 125 mL/hr over 60 Minutes Intravenous Every 24 hours 10/25/19 1453 10/27/19 0801   10/25/19 1530  vancomycin (VANCOREADY) IVPB 1250 mg/250 mL  Status:  Discontinued     1,250 mg 166.7 mL/hr over 90 Minutes Intravenous Every 24 hours 10/25/19 1509 10/28/19 1250   10/25/19 1500  azithromycin (ZITHROMAX) 500 mg in sodium chloride 0.9 % 250 mL IVPB     500 mg 250 mL/hr over 60 Minutes Intravenous  Once 10/25/19 1453 10/25/19 1900   10/23/19 0200  meropenem (MERREM) 1 g in sodium chloride 0.9 % 100 mL IVPB  Status:  Discontinued     1 g 200 mL/hr over 30 Minutes Intravenous Every 12 hours 10/23/19 0045 10/27/19 0908   10/23/19 0030  meropenem (MERREM) 1 g in sodium chloride 0.9 % 100 mL IVPB  Status:  Discontinued  1 g 200 mL/hr over 30 Minutes Intravenous  Once 10/23/19 0015 10/23/19 0044   10/22/19 2315  piperacillin-tazobactam (ZOSYN) IVPB 3.375 g  Status:  Discontinued     3.375 g 100 mL/hr over 30 Minutes Intravenous  Once 10/22/19 2307 10/22/19 2340       Objective   Vitals:   10/27/19 2140 10/28/19 0548 10/28/19 0943 10/28/19 1336  BP: 94/62 111/66 90/63   Pulse: 88 83 81 78  Resp: 17 17 18    Temp: 98.2 F (36.8 C) 98.2 F (36.8 C) 98.5 F (36.9 C)   TempSrc:   Oral   SpO2: 96% 100% 100% 99%  Weight: 100.8 kg     Height:        SpO2: 99 % O2 Flow Rate (L/min): 1 L/min  Wt Readings from Last 3 Encounters:  10/27/19 100.8 kg  09/27/19 98 kg  09/05/19 94.1 kg     Intake/Output Summary (Last 24 hours) at 10/28/2019 1534 Last data filed at 10/28/2019 1500 Gross per 24 hour  Intake 890 ml  Output 0 ml  Net 890 ml    Physical Exam:  Awake Alert, Oriented to self, place, context, time.  No distress. No focal deficits. Slow speech [place].AT, Moist oral mucosa, no oral lesions, poor  dentition Normal respiratory effort on 1 L, no appreciable crackles or wheezing, decreased breath sounds at bases.  RRR,No Gallops,Rubs or new Murmurs,  Normal bowel sounds, no tenderness or guarding, no distension No edema    I have personally reviewed the following:   Data Reviewed:  CBC Recent Labs  Lab 10/24/19 0500 10/25/19 0452 10/26/19 0402 10/27/19 0326 10/28/19 0333  WBC 9.4 11.4* 11.2* 11.6* 12.6*  HGB 8.1* 9.0* 8.5* 8.2* 8.3*  HCT 23.2* 25.4* 24.3* 23.1* 23.2*  PLT 207 217 200 181 184  MCV 86.9 86.1 87.1 85.2 83.8  MCH 30.3 30.5 30.5 30.3 30.0  MCHC 34.9 35.4 35.0 35.5 35.8  RDW 16.2* 16.3* 16.8* 16.7* 16.7*    Chemistries  Recent Labs  Lab 10/22/19 1756 10/23/19 0315 10/24/19 0500 10/25/19 0452 10/26/19 0402 10/27/19 0326 10/28/19 0333  NA 136   < > 144 142 142 145 144  K 5.2*   < > 4.3 4.4 4.0 4.1 4.0  CL 102   < > 112* 109 110 115* 113*  CO2 23   < > 24 23 23 25 24   GLUCOSE 111*   < > 78 137* 166* 97 140*  BUN 16   < > 15 12 10 8 10   CREATININE 1.87*   < > 1.51* 1.31* 1.24* 1.20* 1.25*  CALCIUM 7.6*   < > 6.9* 7.0* 6.7* 6.8* 6.6*  MG  --   --   --   --   --  1.6* 1.9  AST 32  --   --   --   --   --   --   ALT 29  --   --   --   --   --   --   ALKPHOS 140*  --   --   --   --   --   --   BILITOT 1.2  --   --   --   --   --   --    < > = values in this interval not displayed.   ------------------------------------------------------------------------------------------------------------------ No results for input(s): CHOL, HDL, LDLCALC, TRIG, CHOLHDL, LDLDIRECT in the last 72 hours.  Lab Results  Component Value Date  HGBA1C 5.6 09/27/2019   ------------------------------------------------------------------------------------------------------------------ No results for input(s): TSH, T4TOTAL, T3FREE, THYROIDAB in the last 72 hours.  Invalid input(s): FREET3  ------------------------------------------------------------------------------------------------------------------ No results for input(s): VITAMINB12, FOLATE, FERRITIN, TIBC, IRON, RETICCTPCT in the last 72 hours.  Coagulation profile No results for input(s): INR, PROTIME in the last 168 hours.  No results for input(s): DDIMER in the last 72 hours.  Cardiac Enzymes No results for input(s): CKMB, TROPONINI, MYOGLOBIN in the last 168 hours.  Invalid input(s): CK ------------------------------------------------------------------------------------------------------------------    Component Value Date/Time   BNP 138.0 (H) 09/17/2019 1725    Micro Results Recent Results (from the past 240 hour(s))  Urine culture     Status: Abnormal   Collection Time: 10/22/19  8:01 PM   Specimen: Urine, Random  Result Value Ref Range Status   Specimen Description URINE, RANDOM  Final   Special Requests   Final    NONE Performed at [facility], [address], [place], [place] [address]    Culture (A)  Final    >=100,000 COLONIES/mL ESCHERICHIA COLI Confirmed Extended Spectrum Beta-Lactamase Producer (ESBL).  In bloodstream infections from ESBL organisms, carbapenems are preferred over piperacillin/tazobactam. They are shown to have a lower risk of mortality. 40,000 COLONIES/mL VANCOMYCIN RESISTANT ENTEROCOCCUS    Report Status 10/26/2019 FINAL  Final   Organism ID, Bacteria ESCHERICHIA COLI (A)  Final   Organism ID, Bacteria VANCOMYCIN RESISTANT ENTEROCOCCUS (A)  Final      Susceptibility   Escherichia coli - MIC*    AMPICILLIN >=32 RESISTANT Resistant     CEFAZOLIN >=64 RESISTANT Resistant     CEFTRIAXONE >=64 RESISTANT Resistant     CIPROFLOXACIN >=4 RESISTANT Resistant     GENTAMICIN <=1 SENSITIVE Sensitive     IMIPENEM <=0.25 SENSITIVE Sensitive     NITROFURANTOIN <=16 SENSITIVE Sensitive     TRIMETH/SULFA >=320 RESISTANT Resistant     AMPICILLIN/SULBACTAM 8 SENSITIVE Sensitive      PIP/TAZO <=4 SENSITIVE Sensitive     * >=100,000 COLONIES/mL ESCHERICHIA COLI   Vancomycin resistant enterococcus - MIC*    AMPICILLIN >=32 RESISTANT Resistant     NITROFURANTOIN 64 INTERMEDIATE Intermediate     VANCOMYCIN >=32 RESISTANT Resistant     LINEZOLID 2 SENSITIVE Sensitive     * 40,000 COLONIES/mL VANCOMYCIN RESISTANT ENTEROCOCCUS  SARS CORONAVIRUS 2 (TAT 6-24 HRS) Nasopharyngeal Nasopharyngeal Swab     Status: None   Collection Time: 10/23/19  2:19 AM   Specimen: Nasopharyngeal Swab  Result Value Ref Range Status   SARS Coronavirus 2 NEGATIVE NEGATIVE Final    Comment: (NOTE) SARS-CoV-2 target nucleic acids are NOT DETECTED. The SARS-CoV-2 RNA is generally detectable in upper and lower respiratory specimens during the acute phase of infection. Negative results do not preclude SARS-CoV-2 infection, do not rule out co-infections with other pathogens, and should not be used as the sole basis for treatment or other patient management decisions. Negative results must be combined with clinical observations, patient history, and epidemiological information. The expected result is Negative. Fact Sheet for Patients: SugarRoll.be Fact Sheet for Healthcare Providers: https://www.woods-mathews.com/ This test is not yet approved or cleared by the [place] FDA and  has been authorized for detection and/or diagnosis of SARS-CoV-2 by FDA under an Emergency Use Authorization (EUA). This EUA will remain  in effect (meaning this test can be used) for the duration of the COVID-19 declaration under Section 56 4(b)(1) of the Act, 21 U.S.C. section 360bbb-3(b)(1), unless the authorization is terminated or revoked sooner.  Performed at [facility], [place] [address], [place], [place] [address]   MRSA PCR Screening     Status: Abnormal   Collection Time: 10/24/19  1:54 PM   Specimen: Nasopharyngeal  Result Value Ref Range Status   MRSA by  PCR POSITIVE (A) NEGATIVE Final    Comment:        The GeneXpert MRSA Assay (FDA approved for NASAL specimens only), is one component of a comprehensive MRSA colonization surveillance program. It is not intended to diagnose MRSA infection nor to guide or monitor treatment for MRSA infections. RESULT CALLED TO, READ BACK BY AND VERIFIED WITH: Garlon Hatchet RN 15:50 10/24/19 ([email or web address]) Performed at [facility], [place] [address] [place], [place] [address]   Culture, blood (Routine X 2) w Reflex to ID Panel     Status: None (Preliminary result)   Collection Time: 10/24/19  8:19 PM   Specimen: BLOOD  Result Value Ref Range Status   Specimen Description BLOOD RIGHT ANTECUBITAL  Final   Special Requests   Final    BOTTLES DRAWN AEROBIC AND ANAEROBIC Blood Culture adequate volume   Culture   Final    NO GROWTH 4 DAYS Performed at [facility], [place] [address] [place], [place] [address]    Report Status PENDING  Incomplete  Culture, blood (single)     Status: None (Preliminary result)   Collection Time: 10/25/19  7:20 AM   Specimen: BLOOD  Result Value Ref Range Status   Specimen Description BLOOD LEFT ANTECUBITAL  Final   Special Requests   Final    BOTTLES DRAWN AEROBIC AND ANAEROBIC Blood Culture adequate volume   Culture   Final    NO GROWTH 3 DAYS Performed at [facility], [place] [address] [place], [place] [address]    Report Status PENDING  Incomplete    Radiology Reports CT ABDOMEN PELVIS WO CONTRAST  Result Date: 10/22/2019 CLINICAL DATA:  Acute nonlocalized abdominal pain EXAM: CT ABDOMEN AND PELVIS WITHOUT CONTRAST TECHNIQUE: Multidetector CT imaging of the abdomen and pelvis was performed following the standard protocol without IV contrast. COMPARISON:  09/20/2019, 08/08/2019 FINDINGS: Lower chest: Patchy bilateral lower lobe ground-glass airspace disease demonstrates minimal improvement. There is now a small left pleural effusion. Hepatobiliary: Stable  severe fatty infiltration of the liver. No focal abnormality on this unenhanced exam. The gallbladder is distended without evidence of cholelithiasis or cholecystitis. Pancreas: Diffuse atrophy, stable.  No focal abnormality. Spleen: Normal in size without focal abnormality. Adrenals/Urinary Tract: No urinary tract calculi or obstructive uropathy. Mild left renal atrophy unchanged. Bladder is unremarkable. The adrenals are normal. Stomach/Bowel: There is mild diffuse colonic wall thickening which may be inflammatory or infectious. No bowel obstruction or ileus. 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 Vascular/Lymphatic: Aortic atherosclerosis. No enlarged abdominal or pelvic lymph nodes. Reproductive: Uterus and bilateral adnexa are unremarkable. Other: There is a small amount of free fluid within the pelvis, nonspecific. No free gas. Postsurgical changes are seen from previous midline laparotomy. Body wall edema is seen throughout the lower abdomen and pelvis. Musculoskeletal: Right hip arthroplasty is identified. No acute or destructive bony lesions. Reconstructed images demonstrate no additional findings. IMPRESSION: 1. Mild diffuse colonic wall thickening which may reflect inflammatory or infectious colitis. 2. Persistent bibasilar ground-glass airspace disease compatible with multifocal pneumonia. New small left effusion. 3. Small amount of free fluid within the pelvis, nonspecific. 4. Diffuse fatty infiltration of the liver. Electronically Signed   By:  Randa Ngo M.D.   On: 10/22/2019 21:24   CT CHEST WO CONTRAST  Result Date: 10/25/2019 CLINICAL DATA:  Hypoxia, multifocal pneumonia on abdominal CT, history of COVID-13 August 2019 EXAM: CT CHEST WITHOUT CONTRAST TECHNIQUE: Multidetector CT imaging of the chest was performed following the standard protocol without IV contrast. COMPARISON:  10/24/2019, 10/22/2019, 11/02/2017 FINDINGS: Cardiovascular: The heart is normal in size  without pericardial effusion. There is severe atherosclerosis of the coronary vasculature. Mild atherosclerosis of the aortic arch. Thoracic aorta is normal in caliber. Mediastinum/Nodes: No pathologic adenopathy. Thyroid, trachea, and esophagus are stable. Lungs/Pleura: There are patchy bilateral areas of ground-glass airspace disease. Overall, in comparison to abdominal CT 08/08/2019, there is only minimal interval improvement. Small bilateral pleural effusions have developed. No pneumothorax. The central airways are patent. Upper Abdomen: Stable fatty infiltration of the liver. Small amount of ascites now seen within the upper abdomen. Musculoskeletal: There is mild diffuse subcutaneous edema. No acute displaced fracture. Reconstructed images demonstrate no additional findings. IMPRESSION: 1. Persistent multifocal areas of ground-glass airspace disease. This could reflect persistent pneumonia or postinflammatory scarring, given history of COVID-19 infection January 2021. 2. Interval development of small bilateral pleural effusions, volume estimated less than 500 cc each. 3. Interval development of small volume ascites within the upper abdomen. 4. Fatty liver. 5. Aortic Atherosclerosis (ICD10-I70.0). Severe coronary atherosclerosis. Electronically Signed   By: Randa Ngo M.D.   On: 10/25/2019 17:11   DG CHEST PORT 1 VIEW  Result Date: 10/24/2019 CLINICAL DATA:  Hypoxia. EXAM: PORTABLE CHEST 1 VIEW COMPARISON:  Chest x-ray dated September 17, 2019. FINDINGS: Unchanged right chest wall port catheter. The heart size and mediastinal contours are within normal limits. Normal pulmonary vascularity. Low lung volumes. No focal consolidation, pleural effusion, or pneumothorax. No acute osseous abnormality. IMPRESSION: No active disease. Electronically Signed   By: Titus Dubin M.D.   On: 10/24/2019 17:05   DG HIP UNILAT WITH PELVIS 2-3 VIEWS LEFT  Result Date: 10/25/2019 CLINICAL DATA:  Hip pain, no injury  EXAM: DG HIP (WITH OR WITHOUT PELVIS) 2-3V LEFT COMPARISON:  03/07/2019 FINDINGS: Status post right hip total arthroplasty. There is no evidence of hip fracture or dislocation. There is no evidence of arthropathy or other focal bone abnormality. IMPRESSION: 1. No fracture or dislocation of the left hip. No significant arthrosis. 2.  Status post right hip total arthroplasty. Electronically Signed   By: Eddie Candle M.D.   On: 10/25/2019 12:46   VAS [place] LOWER EXTREMITY VENOUS (DVT)  Result Date: 10/24/2019  Lower Venous DVTStudy Indications: Swelling.  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 Anticoagulation: Eliquis. Limitations: Body habitus and poor ultrasound/tissue interface. Comparison Study: Left LEV 08-25-19, negative. Performing Technologist: Baldwin Crown ARDMS, RVT  Examination Guidelines: A complete evaluation includes B-mode imaging, spectral Doppler, color Doppler, and power Doppler as needed of all accessible portions of each vessel. Bilateral testing is considered an integral part of a complete examination. Limited examinations for reoccurring indications may be performed as noted. The reflux portion of the exam is performed with the patient in reverse Trendelenburg.  +-----+---------------+---------+-----------+----------+--------------+ RIGHTCompressibilityPhasicitySpontaneityPropertiesThrombus Aging +-----+---------------+---------+-----------+----------+--------------+ CFV  Full           Yes      Yes                                 +-----+---------------+---------+-----------+----------+--------------+   +---------+---------------+---------+-----------+----------+-----------------+ LEFT     CompressibilityPhasicitySpontaneityPropertiesThrombus Aging    +---------+---------------+---------+-----------+----------+-----------------+ CFV  Full           Yes      Yes                                     +---------+---------------+---------+-----------+----------+-----------------+ SFJ      Full                                                           +---------+---------------+---------+-----------+----------+-----------------+ FV Prox  Full                                                           +---------+---------------+---------+-----------+----------+-----------------+ FV Mid   Full                                                           +---------+---------------+---------+-----------+----------+-----------------+ FV DistalFull                                         poorly visualized +---------+---------------+---------+-----------+----------+-----------------+ PFV      Full                                                           +---------+---------------+---------+-----------+----------+-----------------+ POP      Full           Yes      Yes                                    +---------+---------------+---------+-----------+----------+-----------------+ PTV                                                   poorly visualized +---------+---------------+---------+-----------+----------+-----------------+ PERO                                                  poorly visualized +---------+---------------+---------+-----------+----------+-----------------+ Interstitial fluid seen in anterior distal lower extremity.    Summary: RIGHT: - No evidence of common femoral vein obstruction.  LEFT: - There is no evidence of deep vein thrombosis in the lower extremity. However, portions of this examination were limited- see technologist comments above.  - No cystic structure found in the popliteal fossa.  *See table(s) above for measurements and observations. Electronically signed by Monica Martinez MD on 10/24/2019 at  5:25:14 PM.    Final      Time Spent in minutes  30     Desiree Hane M.D on 10/28/2019 at 3:34 PM  To page go to [email or web address] -  password [facility]

## 2019-10-28 NOTE — Plan of Care (Signed)
  Problem: Activity: Goal: Risk for activity intolerance will decrease Outcome: Progressing   

## 2019-10-29 LAB — CBC
HCT: 23.6 % — ABNORMAL LOW (ref 36.0–46.0)
Hemoglobin: 8.4 g/dL — ABNORMAL LOW (ref 12.0–15.0)
MCH: 30.1 pg (ref 26.0–34.0)
MCHC: 35.6 g/dL (ref 30.0–36.0)
MCV: 84.6 fL (ref 80.0–100.0)
Platelets: 169 10*3/uL (ref 150–400)
RBC: 2.79 MIL/uL — ABNORMAL LOW (ref 3.87–5.11)
RDW: 16.7 % — ABNORMAL HIGH (ref 11.5–15.5)
WBC: 11.5 10*3/uL — ABNORMAL HIGH (ref 4.0–10.5)
nRBC: 0.2 % (ref 0.0–0.2)

## 2019-10-29 LAB — GI PATHOGEN PANEL BY PCR, STOOL

## 2019-10-29 LAB — BASIC METABOLIC PANEL
Anion gap: 8 (ref 5–15)
BUN: 10 mg/dL (ref 8–23)
CO2: 23 mmol/L (ref 22–32)
Calcium: 6.7 mg/dL — ABNORMAL LOW (ref 8.9–10.3)
Chloride: 113 mmol/L — ABNORMAL HIGH (ref 98–111)
Creatinine, Ser: 1.21 mg/dL — ABNORMAL HIGH (ref 0.44–1.00)
GFR calc Af Amer: 54 mL/min — ABNORMAL LOW (ref >60)
GFR calc non Af Amer: 46 mL/min — ABNORMAL LOW (ref >60)
Glucose, Bld: 111 mg/dL — ABNORMAL HIGH (ref 70–99)
Potassium: 3.8 mmol/L (ref 3.5–5.1)
Sodium: 144 mmol/L (ref 135–145)

## 2019-10-29 LAB — GLUCOSE, CAPILLARY
Glucose-Capillary: 92 mg/dL (ref 70–99)
Glucose-Capillary: 150 mg/dL — ABNORMAL HIGH (ref 70–99)
Glucose-Capillary: 148 mg/dL — ABNORMAL HIGH (ref 70–99)
Glucose-Capillary: 131 mg/dL — ABNORMAL HIGH (ref 70–99)

## 2019-10-29 LAB — CULTURE, BLOOD (ROUTINE X 2)
Culture: NO GROWTH
Special Requests: ADEQUATE

## 2019-10-29 LAB — MAGNESIUM: Magnesium: 1.8 mg/dL (ref 1.7–2.4)

## 2019-10-29 NOTE — Plan of Care (Signed)
  Problem: Clinical Measurements: Goal: Ability to maintain clinical measurements within normal limits will improve Outcome: Progressing   Problem: Activity: Goal: Risk for activity intolerance will decrease Outcome: Progressing   

## 2019-10-29 NOTE — Progress Notes (Signed)
PT Cancellation Note  Patient Details Name: [name] MRN: [id number] DOB: [birth year]   Cancelled Treatment:    Reason Eval/Treat Not Completed: Other (comment)   Attempted PT session;  Pt currently experiencing N/V;   Will reattempt;   Roney Marion, [place] Pager [phone] Office [phone]    [place]ta [place] 10/29/2019, 10:12 AM

## 2019-10-29 NOTE — Progress Notes (Addendum)
Physical Therapy Treatment Note  Clinical Impression: Continuing work on functional mobility and activity tolerance;  Ms. Trower was fatigued throughout session, tending to keep her eyes closed, but slightly more alert sitting EOB; Session focused on functional transfers -- she required 2 person max assist to get OOB to the recliner; Recommend [employer] for safe transfer back to bed    10/29/19 1500  PT Visit Information  Last PT Received On 10/29/19  Assistance Needed +2  History of Present Illness 69 yo female with onset of abd pain on 3/30 and colitis, UTI, was admitted for symptoms.  Pt was hypoxic on 2L at 79%, feeling weak.  Has PNA or post Covid changes in lungs, had recent admission for UTI septic shock in early March 2021.  Has acute metabolic encephalopathy, gastritis, PMHx:  Covid 22, UTI, AKI, CKD, dehydration, falls, hypotension, DM, DVT, PN, mood disorder, blurry vision, anemia,   Subjective Data  Patient Stated Goal to get home to family  Precautions  Precautions Fall  Pain Assessment  Pain Assessment No/denies pain  Cognition  Arousal/Alertness Awake/alert  Behavior During Therapy Flat affect  Overall Cognitive Status Impaired/Different from baseline  Area of Impairment Problem solving;Awareness;Attention;Memory  Problem Solving Slow processing;Requires verbal cues  General Comments Eyes closed at least 50% of session  Bed Mobility  Overal bed mobility Needs Assistance  Bed Mobility Supine to Sit  Supine to sit Max assist  General bed mobility comments Max assist to get up with trunk support, assist to scoot forward in bed  Transfers  Overall transfer level Needs assistance  Equipment used 2 person hand held assist  Transfers Squat Pivot Transfers  Squat pivot transfers Max assist;+2 physical assistance  General transfer comment Max assist with bil support at shoulder girdles and gait belt; Required knee block for stability during transition to recliner; unable to  achieve fully upright standing  Balance  Sitting balance-Leahy Scale Poor  Standing balance-Leahy Scale Zero  PT - End of Session  Equipment Utilized During Treatment Gait belt  Activity Tolerance Patient limited by fatigue  Patient left in chair;with call bell/phone within reach  Nurse Communication Mobility status;Need for lift equipment   PT - Assessment/Plan  PT Plan Current plan remains appropriate  PT Visit Diagnosis Unsteadiness on feet (R26.81);Muscle weakness (generalized) (M62.81);Difficulty in walking, not elsewhere classified (R26.2)  PT Frequency (ACUTE ONLY) Min 2X/week  Follow Up Recommendations SNF  PT equipment Rolling walker with 5" wheels;3in1 (PT)  AM-PAC PT "6 Clicks" Mobility Outcome Measure (Version 2)  Help needed turning from your back to your side while in a flat bed without using bedrails? 3  Help needed moving from lying on your back to sitting on the side of a flat bed without using bedrails? 2  Help needed moving to and from a bed to a chair (including a wheelchair)? 2  Help needed standing up from a chair using your arms (e.g., wheelchair or bedside chair)? 2  Help needed to walk in hospital room? 1  Help needed climbing 3-5 steps with a railing?  1  6 Click Score 11  Consider Recommendation of Discharge To: CIR/SNF/LTACH  PT Goal Progression  Progress towards PT goals Progressing toward goals (slowly)  Acute Rehab PT Goals  PT Goal Formulation With patient  Time For Goal Achievement 11/09/19  Potential to Achieve Goals Fair     10/29/19 1353  PT Time Calculation  PT Start Time (ACUTE ONLY) 1231  PT Stop Time (ACUTE ONLY) 1249  PT Time  Calculation (min) (ACUTE ONLY) 18 min  PT General Charges  $$ ACUTE PT VISIT 1 Visit  PT Treatments  $Therapeutic Activity 8-22 mins    Roney Marion, [place]  Acute Rehabilitation Services Pager [phone] Office [phone]

## 2019-10-29 NOTE — Progress Notes (Cosign Needed)
RE: [name] Date of Birth: 11-Sep-1950 Date: 4/62021  To Whom It May Concern:  Please be advised that the above-named patient has a primary diagnosis of dementia which supersedes any psychiatric diagnosis.

## 2019-10-29 NOTE — Plan of Care (Signed)
  Problem: Activity: Goal: Risk for activity intolerance will decrease Outcome: Progressing   

## 2019-10-29 NOTE — Progress Notes (Signed)
[facility]  PROGRESS NOTE  [name] GGY:[id number] DOB: April 12, 1951 DOA: 10/22/2019 PCP: Ann Held, DO Admit date - 10/22/2019   Admitting Physician Etta Quill, DO  Outpatient Primary MD for the patient is Carollee Herter, Alferd Apa, DO  LOS - 7 Brief Narrative   [name] is a 69 y.o. year old female with medical history significant for stomach CA in remission, DM2, hypotension on midodrine, COVID 19 infection with no need for treatment but acute DVT (07/2019 now on Eliquis, and COVID who presented on 10/22/2019 with worsening abdominal pain was admitted with working diagnosis of UTI and concern for infectious colitis currently on IV meropenem.  Hospital Course: Initially empirically treated with meropenem for mild diffuse colitis in setting of recent ESBL UTI. On 4/2 ct chest for hypoxia (SpO 279% on 2 L)confirmed ground glass airspace disease that either representing multifocal pneumonia or postinflammatory scarring from recent COVID-19 infection (January 2021).  Patient was started on empiric vancomycin on 4/2 in addition to meropenem given positive MRSA swab and above findings. Given no growth on blood cultures patient was weaned to doxycycline on 4/5 but suspect lung findings on CT chest were sequelae of prior COVID infection. She is continuing meropenem for her colitis.  She does not have abdominal pain but complains of dry mouth and decreased appetite.   Notable history for recent hospitalizations:  1/14-1/22/2021 for generalized weakness and recurrent falls after recent hospitalization for Covid infection periumbilical rash, AKI on CKD due to dehydration/poor appetite/nausea and orthostatic hypotension responsive to IV fluids patient was discharged home, declining SNF recommendations by PT/OT  1/22/--08/20/2019 for further thrive/generalized weakness and physical deconditioning from recent hospitalizations was AKI on CKD related to dehydration that responded to IV  fluids.  Patient was discharged to skilled nursing facility at the time  2/8-2/12/21 nausea, vomiting, decreased appetite with concern for esophageal dysmotility and gastric dysmotility given patient's history of stage Ib gastric cancer.  Found to have large amount of gastric fluid retaining and recommended to have Reglan before meals  2/23-3/5 for septic shock requiring pressors in ICU from ESBL E. coli UTI and relative adrenal insufficiency and possible aspiration pneumonia requiring pressors.  Palliative discussions were initiated at that time to assist with [place] and symptom management. Family and patient reported they would reach out to palliative when they were ready to continue discussions.   Subjective  Today she is back at her mental status baseline alert and oriented x4.  Reports she ate some of her daughter's food yesterday.  Denies any cough, no shortness of breath, no abdominal pain, no nausea, no vomiting. Complains of dry mouth which is chronic which has been response to biotin in the past. Also complains of tingling in all hands for which she takes gabapentin  A & P   Acute hypoxic respiratory failure with groundglass airspace disease.  Unclear if multifocal pneumonia or postinflammatory scarring from recent COVID-19 infection (January 2021, that did not require any treatment at the time) suspect scarring given no cough, no O2 requirements, S pna and legionella neg, blood cx unremarkable.  Given MRSA swab was positive patient was added on vancomycin on 4/2.  Procalcitonin is elevated ( in setting of mild colitis) -De-escalated to doxycycline on 4/5 -Monitor blood cultures -Encourage flutter valve, incentive spirometry -Encourage out of bed to chair, up in bed with assistance--with PT help  Mild, diffuse colitis, concern for an infectious etiology   Given recent history of very severe ESBL UTI  infection presume likely nidus of infection with likely translocation.  Still only minimal  p.o. intake, endorses low appetite, no nausea or vomiting and no abdominal pain. Blood cultures no growth -IV meropenem to compete for 7 days, end date 4/6 -Monitor blood cultures -As needed Reglan -GIP pathogen panel wnl, C. difficile if diarrhea starts  NSVT on telemetry , asymptomatic.  EKG shows no wide QRS.  Potassium within normal limits, s/p mg repletion on 4/4.  Has preserved EF on TTE (09/02/2018). Has history of PVCs -daily mg, goal magnesium 2, -Monitor BMP, goal potassium 4 -Monitor on telemetry --on metoprolol 12.5 mg BID -change to PRN reglan  Acute on Chronic hypotension, stable Chronic on previous hospital stays and related to poor nutrition. Concern for relative adrenal insufficiency in past but am cortisol wnl this hospital stay. SBP is stable now mid 90s-110s.  Has not had any lactic acidosis.   --Encourage p.o. intake, no longer requires IV fluids -Continue midodrine 10 mg 3 times daily  GERD/gastritis with significant esophageal dysmotility/oresbyesophgus without stricture based on recent barium swallow on 08/2019.  EGD on 08/2019 without stricture -GI recommended during previous hospital stay small boluses of food/bariatric diet from last hospitalization -Liberalize diet given diminished appetite -Encourage small bolus of fluid interspersed with liquids (previous recommendation by GI previous hospitalization) -PRN Reglan -Continue PPI  Decreased oral intake, failure to thrive, protein calorie malnutrition.  Patient endorses diminished appetite since losing sense of taste/smell after COVID.  Has history of esophageal dysmotility without stricture, patient denies any dysphagia or odynophagia, no oral lesions on my examination.  Could mood be contributing? This has been a recurrent issue. Not having any abdominal pain from colitis and having BMs -PRN Reglan, PPI, carafate added( has worked well in the past) -Liberalize diet given diminished appetite -Nutrition consulted,  prealbumin < 5 --Add bioten, has helped in the past per patient and family for dry mouth --consider remeron? (mood and appetite) --Palliative consulted in the past and family wasn't interested  AKI on CKD stage III, likely multifactorial related to infectious etiology, diminished p.o. intake related to above, resolved.  Baseline creatinine is wide ranging ~ 1-1.6, 1.87 on admission and now back to baseline. Improved with IVF --Encourage oral intake, daily BMP, avoid nephrotoxins  Left lower leg pain and swelling, resolved No longer having any swelling.  Seems MSK in nature or neuropathic related to DM. Improved on home tramadol --consider neuropathic agents if recurs -Left leg x-ray and venous duplex negative for acute fracture or DVT respectively -continue home pain medicine  Acute metabolic encephalopathy, resolved.  Bouts of confusion//visual hallucinations have resolved.  Suspect hospital-acquired delirium in the setting of infectious nidus. No known history of dementia. CT head on last admission unremarkable. Decreased attention and slow speech but alert and oriented 4.  -Continue delirium precautions -Does not have any focal deficits, does not warrant head imaging  -avoid sedatives  Hyperkalemia, mild, resolved.  Peak potassium 5.4 -continue monitoring BMP  History of ESBL E. coli UTI.  Completed previous antibiotic course.  UA here positive however patient denies any urinary symptoms. -urine cx shows ESBL E.coli similar to prior  Type 2 diabetes with peripheral neuropathy, A1c 5.6.  -Continue to closely monitor on sliding scale as needed -Continue gabapentin for mood  Anemia of chronic disease No signs symptoms of bleeding.  Hemoglobin at baseline -Daily CBC  History of CAD status post PCI stent in 08/2018, asymptomatic -Continue home Lopressor  Recent DVT, diagnosed 08/21/2019 -continue home Eliquis  Chronic pain syndrome -Continue home tramadol  Mood disorder,  stable Flat affect. But no SI or HI - continue zoloft 50 mg qd  Reported worsening R eye blurry vision, subacute Patient does have history of diabetes. MRI on 3/3 showed remote lacunar infarcts with no acute abnormalities (first evaluated during that previous hospital stay) -Has ophthalmology appointment as outpatient on 4/13  Generalized physical deconditioning. 5 hospitalization in last 4 months and in rehab facilities. Likely all due to acute illness. No focal deficits -PT/OT recommends SNF     Family Communication  : Updated daughter Ayumi Wangerin [phone] on 4/6.  Code Status : Full  Disposition Plan :  Patient is from home. Anticipated d/c date: 24-48 hours Barriers to d/c or necessity for inpatient status: completed Iv  meropenem for colitis  On 4/6. Ensure BP is stable. Longstanding poor appetite likely failure to thrive, not interested in palliative on last admission. Likely dc to SNF on 4/7  Consults  : None  Procedures  : venous duplex  DVT Prophylaxis  : Eliquis  Lab Results  Component Value Date   PLT 169 10/29/2019    Diet :  Diet Order            Diet regular Room service appropriate? Yes; Fluid consistency: Thin  Diet effective now               Inpatient Medications Scheduled Meds: . antiseptic oral rinse  15 mL Mouth Rinse TID WC  . apixaban  5 mg Oral BID  . Chlorhexidine Gluconate Cloth  6 each Topical Daily  . doxycycline  100 mg Oral Q12H  . famotidine  10 mg Oral Daily  . feeding supplement (ENSURE ENLIVE)  237 mL Oral TID BM  . gabapentin  300 mg Oral Daily   And  . gabapentin  600 mg Oral QHS  . insulin aspart  0-15 Units Subcutaneous TID WC  . insulin aspart  0-5 Units Subcutaneous QHS  . metoprolol tartrate  12.5 mg Oral BID  . midodrine  10 mg Oral TID WC  . multivitamin with minerals  1 tablet Oral Daily  . mupirocin ointment   Nasal BID  . nystatin   Topical TID  . pantoprazole  40 mg Oral Daily  . rosuvastatin  20 mg Oral  q1800  . sertraline  50 mg Oral Daily  . sucralfate  1 g Oral TID WC & HS   Continuous Infusions:  PRN Meds:.acetaminophen **OR** acetaminophen, albuterol, lidocaine-prilocaine, metoCLOPramide, ondansetron **OR** ondansetron (ZOFRAN) IV, sodium chloride flush, sodium chloride flush, traMADol, traZODone  Antibiotics  :   Anti-infectives (From admission, onward)   Start     Dose/Rate Route Frequency Ordered Stop   10/28/19 1300  doxycycline (VIBRA-TABS) tablet 100 mg     100 mg Oral Every 12 hours 10/28/19 1250     10/27/19 1000  azithromycin (ZITHROMAX) tablet 250 mg  Status:  Discontinued     250 mg Oral Daily 10/27/19 0801 10/28/19 1257   10/27/19 1000  meropenem (MERREM) 1 g in sodium chloride 0.9 % 100 mL IVPB     1 g 200 mL/hr over 30 Minutes Intravenous Every 8 hours 10/27/19 0908 10/29/19 1750   10/26/19 1000  azithromycin (ZITHROMAX) 250 mg in dextrose 5 % 125 mL IVPB  Status:  Discontinued     250 mg 125 mL/hr over 60 Minutes Intravenous Every 24 hours 10/25/19 1453 10/27/19 0801   10/25/19 1530  vancomycin (VANCOREADY) IVPB 1250 mg/250 mL  Status:  Discontinued     1,250 mg 166.7 mL/hr over 90 Minutes Intravenous Every 24 hours 10/25/19 1509 10/28/19 1250   10/25/19 1500  azithromycin (ZITHROMAX) 500 mg in sodium chloride 0.9 % 250 mL IVPB     500 mg 250 mL/hr over 60 Minutes Intravenous  Once 10/25/19 1453 10/25/19 1900   10/23/19 0200  meropenem (MERREM) 1 g in sodium chloride 0.9 % 100 mL IVPB  Status:  Discontinued     1 g 200 mL/hr over 30 Minutes Intravenous Every 12 hours 10/23/19 0045 10/27/19 0908   10/23/19 0030  meropenem (MERREM) 1 g in sodium chloride 0.9 % 100 mL IVPB  Status:  Discontinued     1 g 200 mL/hr over 30 Minutes Intravenous  Once 10/23/19 0015 10/23/19 0044   10/22/19 2315  piperacillin-tazobactam (ZOSYN) IVPB 3.375 g  Status:  Discontinued     3.375 g 100 mL/hr over 30 Minutes Intravenous  Once 10/22/19 2307 10/22/19 2340       Objective    Vitals:   10/29/19 0958 10/29/19 0958 10/29/19 1230 10/29/19 1727  BP:  99/72  101/68  Pulse:  (!) 102 92 90  Resp:  18  16  Temp: 98.6 F (37 C)   98.8 F (37.1 C)  TempSrc: Oral   Oral  SpO2:  96%  98%  Weight:      Height:        SpO2: 98 % O2 Flow Rate (L/min): 1 L/min  Wt Readings from Last 3 Encounters:  10/28/19 99.2 kg  09/27/19 98 kg  09/05/19 94.1 kg     Intake/Output Summary (Last 24 hours) at 10/29/2019 1853 Last data filed at 10/29/2019 1700 Gross per 24 hour  Intake 820 ml  Output 0 ml  Net 820 ml    Physical Exam:  Awake Alert, Oriented to self, place, context, time.  No distress. No focal deficits. Slow speech [place].AT, Moist oral mucosa, no oral lesions, poor dentition Normal respiratory effort on 1 L, no appreciable crackles or wheezing, decreased breath sounds at bases.  RRR,No Gallops,Rubs or new Murmurs,  Normal bowel sounds, no tenderness or guarding, no distension No edema    I have personally reviewed the following:   Data Reviewed:  CBC Recent Labs  Lab 10/25/19 0452 10/26/19 0402 10/27/19 0326 10/28/19 0333 10/29/19 0354  WBC 11.4* 11.2* 11.6* 12.6* 11.5*  HGB 9.0* 8.5* 8.2* 8.3* 8.4*  HCT 25.4* 24.3* 23.1* 23.2* 23.6*  PLT 217 200 181 184 169  MCV 86.1 87.1 85.2 83.8 84.6  MCH 30.5 30.5 30.3 30.0 30.1  MCHC 35.4 35.0 35.5 35.8 35.6  RDW 16.3* 16.8* 16.7* 16.7* 16.7*    Chemistries  Recent Labs  Lab 10/25/19 0452 10/26/19 0402 10/27/19 0326 10/28/19 0333 10/29/19 0354  NA 142 142 145 144 144  K 4.4 4.0 4.1 4.0 3.8  CL 109 110 115* 113* 113*  CO2 23 23 25 24 23   GLUCOSE 137* 166* 97 140* 111*  BUN 12 10 8 10 10   CREATININE 1.31* 1.24* 1.20* 1.25* 1.21*  CALCIUM 7.0* 6.7* 6.8* 6.6* 6.7*  MG  --   --  1.6* 1.9 1.8   ------------------------------------------------------------------------------------------------------------------ No results for input(s): CHOL, HDL, LDLCALC, TRIG, CHOLHDL, LDLDIRECT in the last 72  hours.  Lab Results  Component Value Date   HGBA1C 5.6 09/27/2019   ------------------------------------------------------------------------------------------------------------------ No results for input(s): TSH, T4TOTAL, T3FREE, THYROIDAB in the last 72 hours.  Invalid input(s): FREET3 ------------------------------------------------------------------------------------------------------------------ No results for  input(s): VITAMINB12, FOLATE, FERRITIN, TIBC, IRON, RETICCTPCT in the last 72 hours.  Coagulation profile No results for input(s): INR, PROTIME in the last 168 hours.  No results for input(s): DDIMER in the last 72 hours.  Cardiac Enzymes No results for input(s): CKMB, TROPONINI, MYOGLOBIN in the last 168 hours.  Invalid input(s): CK ------------------------------------------------------------------------------------------------------------------    Component Value Date/Time   BNP 138.0 (H) 09/17/2019 1725    Micro Results Recent Results (from the past 240 hour(s))  Urine culture     Status: Abnormal   Collection Time: 10/22/19  8:01 PM   Specimen: Urine, Random  Result Value Ref Range Status   Specimen Description URINE, RANDOM  Final   Special Requests   Final    NONE Performed at [facility], [address] [place], [place] [address]    Culture (A)  Final    >=100,000 COLONIES/mL ESCHERICHIA COLI Confirmed Extended Spectrum Beta-Lactamase Producer (ESBL).  In bloodstream infections from ESBL organisms, carbapenems are preferred over piperacillin/tazobactam. They are shown to have a lower risk of mortality. 40,000 COLONIES/mL VANCOMYCIN RESISTANT ENTEROCOCCUS    Report Status 10/26/2019 FINAL  Final   Organism ID, Bacteria ESCHERICHIA COLI (A)  Final   Organism ID, Bacteria VANCOMYCIN RESISTANT ENTEROCOCCUS (A)  Final      Susceptibility   Escherichia coli - MIC*    AMPICILLIN >=32 RESISTANT Resistant     CEFAZOLIN >=64 RESISTANT Resistant      CEFTRIAXONE >=64 RESISTANT Resistant     CIPROFLOXACIN >=4 RESISTANT Resistant     GENTAMICIN <=1 SENSITIVE Sensitive     IMIPENEM <=0.25 SENSITIVE Sensitive     NITROFURANTOIN <=16 SENSITIVE Sensitive     TRIMETH/SULFA >=320 RESISTANT Resistant     AMPICILLIN/SULBACTAM 8 SENSITIVE Sensitive     PIP/TAZO <=4 SENSITIVE Sensitive     * >=100,000 COLONIES/mL ESCHERICHIA COLI   Vancomycin resistant enterococcus - MIC*    AMPICILLIN >=32 RESISTANT Resistant     NITROFURANTOIN 64 INTERMEDIATE Intermediate     VANCOMYCIN >=32 RESISTANT Resistant     LINEZOLID 2 SENSITIVE Sensitive     * 40,000 COLONIES/mL VANCOMYCIN RESISTANT ENTEROCOCCUS  SARS CORONAVIRUS 2 (TAT 6-24 HRS) Nasopharyngeal Nasopharyngeal Swab     Status: None   Collection Time: 10/23/19  2:19 AM   Specimen: Nasopharyngeal Swab  Result Value Ref Range Status   SARS Coronavirus 2 NEGATIVE NEGATIVE Final    Comment: (NOTE) SARS-CoV-2 target nucleic acids are NOT DETECTED. The SARS-CoV-2 RNA is generally detectable in upper and lower respiratory specimens during the acute phase of infection. Negative results do not preclude SARS-CoV-2 infection, do not rule out co-infections with other pathogens, and should not be used as the sole basis for treatment or other patient management decisions. Negative results must be combined with clinical observations, patient history, and epidemiological information. The expected result is Negative. Fact Sheet for Patients: SugarRoll.be Fact Sheet for Healthcare Providers: https://www.woods-mathews.com/ This test is not yet approved or cleared by the [place] FDA and  has been authorized for detection and/or diagnosis of SARS-CoV-2 by FDA under an Emergency Use Authorization (EUA). This EUA will remain  in effect (meaning this test can be used) for the duration of the COVID-19 declaration under Section 56 4(b)(1) of the Act, 21 U.S.C. section  360bbb-3(b)(1), unless the authorization is terminated or revoked sooner. Performed at [facility], [place] [address], [place], [place] [address]   MRSA PCR Screening     Status: Abnormal   Collection  Time: 10/24/19  1:54 PM   Specimen: Nasopharyngeal  Result Value Ref Range Status   MRSA by PCR POSITIVE (A) NEGATIVE Final    Comment:        The GeneXpert MRSA Assay (FDA approved for NASAL specimens only), is one component of a comprehensive MRSA colonization surveillance program. It is not intended to diagnose MRSA infection nor to guide or monitor treatment for MRSA infections. RESULT CALLED TO, READ BACK BY AND VERIFIED WITH: Garlon Hatchet RN 15:50 10/24/19 ([email or web address]) Performed at [facility], [place] [address], [place], [place] [address]   Culture, blood (Routine X 2) w Reflex to ID Panel     Status: None   Collection Time: 10/24/19  8:19 PM   Specimen: BLOOD  Result Value Ref Range Status   Specimen Description BLOOD RIGHT ANTECUBITAL  Final   Special Requests   Final    BOTTLES DRAWN AEROBIC AND ANAEROBIC Blood Culture adequate volume   Culture   Final    NO GROWTH 5 DAYS Performed at [facility], [place] [address], [place], [place] [address]    Report Status 10/29/2019 FINAL  Final  Culture, blood (single)     Status: None (Preliminary result)   Collection Time: 10/25/19  7:20 AM   Specimen: BLOOD  Result Value Ref Range Status   Specimen Description BLOOD LEFT ANTECUBITAL  Final   Special Requests   Final    BOTTLES DRAWN AEROBIC AND ANAEROBIC Blood Culture adequate volume   Culture   Final    NO GROWTH 4 DAYS Performed at [facility], [place] [address], [place], [place] [address]    Report Status PENDING  Incomplete  GI pathogen panel by PCR, stool     Status: None   Collection Time: 10/26/19 11:00 AM   Specimen: Stool  Result Value Ref Range Status   Plesiomonas shigelloides NOT DETECTED NOT DETECTED Final   Yersinia enterocolitica NOT DETECTED  NOT DETECTED Final   Vibrio NOT DETECTED NOT DETECTED Final   Enteropathogenic E coli NOT DETECTED NOT DETECTED Final   E coli (ETEC) LT/ST NOT DETECTED NOT DETECTED Final   E coli 9476 by PCR Not applicable NOT DETECTED Final   Cryptosporidium by PCR NOT DETECTED NOT DETECTED Final   Entamoeba histolytica NOT DETECTED NOT DETECTED Final   Adenovirus F 40/41 NOT DETECTED NOT DETECTED Final   Norovirus GI/GII NOT DETECTED NOT DETECTED Final   Sapovirus NOT DETECTED NOT DETECTED Final    Comment: (NOTE) Performed At: [facility] [place], [place] [id number] Rush Farmer MD FK:[id number]    Vibrio cholerae NOT DETECTED NOT DETECTED Final   Campylobacter by PCR NOT DETECTED NOT DETECTED Final   Salmonella by PCR NOT DETECTED NOT DETECTED Final   E coli (STEC) NOT DETECTED NOT DETECTED Final   Enteroaggregative E coli NOT DETECTED NOT DETECTED Final   Shigella by PCR NOT DETECTED NOT DETECTED Final   Cyclospora cayetanensis NOT DETECTED NOT DETECTED Final   Astrovirus NOT DETECTED NOT DETECTED Final   G lamblia by PCR NOT DETECTED NOT DETECTED Final   Rotavirus A by PCR NOT DETECTED NOT DETECTED Final    Radiology Reports CT ABDOMEN PELVIS WO CONTRAST  Result Date: 10/22/2019 CLINICAL DATA:  Acute nonlocalized abdominal pain EXAM: CT ABDOMEN AND PELVIS WITHOUT CONTRAST TECHNIQUE: Multidetector CT imaging of the abdomen and pelvis was performed following the standard protocol without IV contrast. COMPARISON:  09/20/2019, 08/08/2019 FINDINGS: Lower chest: Patchy  bilateral lower lobe ground-glass airspace disease demonstrates minimal improvement. There is now a small left pleural effusion. Hepatobiliary: Stable severe fatty infiltration of the liver. No focal abnormality on this unenhanced exam. The gallbladder is distended without evidence of cholelithiasis or cholecystitis. Pancreas: Diffuse atrophy, stable.  No focal abnormality. Spleen: Normal in size without focal  abnormality. Adrenals/Urinary Tract: No urinary tract calculi or obstructive uropathy. Mild left renal atrophy unchanged. Bladder is unremarkable. The adrenals are normal. Stomach/Bowel: There is mild diffuse colonic wall thickening which may be inflammatory or infectious. No bowel obstruction or ileus. 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 Vascular/Lymphatic: Aortic atherosclerosis. No enlarged abdominal or pelvic lymph nodes. Reproductive: Uterus and bilateral adnexa are unremarkable. Other: There is a small amount of free fluid within the pelvis, nonspecific. No free gas. Postsurgical changes are seen from previous midline laparotomy. Body wall edema is seen throughout the lower abdomen and pelvis. Musculoskeletal: Right hip arthroplasty is identified. No acute or destructive bony lesions. Reconstructed images demonstrate no additional findings. IMPRESSION: 1. Mild diffuse colonic wall thickening which may reflect inflammatory or infectious colitis. 2. Persistent bibasilar ground-glass airspace disease compatible with multifocal pneumonia. New small left effusion. 3. Small amount of free fluid within the pelvis, nonspecific. 4. Diffuse fatty infiltration of the liver. Electronically Signed   By: Randa Ngo M.D.   On: 10/22/2019 21:24   CT CHEST WO CONTRAST  Result Date: 10/25/2019 CLINICAL DATA:  Hypoxia, multifocal pneumonia on abdominal CT, history of COVID-13 August 2019 EXAM: CT CHEST WITHOUT CONTRAST TECHNIQUE: Multidetector CT imaging of the chest was performed following the standard protocol without IV contrast. COMPARISON:  10/24/2019, 10/22/2019, 11/02/2017 FINDINGS: Cardiovascular: The heart is normal in size without pericardial effusion. There is severe atherosclerosis of the coronary vasculature. Mild atherosclerosis of the aortic arch. Thoracic aorta is normal in caliber. Mediastinum/Nodes: No pathologic adenopathy. Thyroid, trachea, and esophagus are stable.  Lungs/Pleura: There are patchy bilateral areas of ground-glass airspace disease. Overall, in comparison to abdominal CT 08/08/2019, there is only minimal interval improvement. Small bilateral pleural effusions have developed. No pneumothorax. The central airways are patent. Upper Abdomen: Stable fatty infiltration of the liver. Small amount of ascites now seen within the upper abdomen. Musculoskeletal: There is mild diffuse subcutaneous edema. No acute displaced fracture. Reconstructed images demonstrate no additional findings. IMPRESSION: 1. Persistent multifocal areas of ground-glass airspace disease. This could reflect persistent pneumonia or postinflammatory scarring, given history of COVID-19 infection January 2021. 2. Interval development of small bilateral pleural effusions, volume estimated less than 500 cc each. 3. Interval development of small volume ascites within the upper abdomen. 4. Fatty liver. 5. Aortic Atherosclerosis (ICD10-I70.0). Severe coronary atherosclerosis. Electronically Signed   By: Randa Ngo M.D.   On: 10/25/2019 17:11   DG CHEST PORT 1 VIEW  Result Date: 10/24/2019 CLINICAL DATA:  Hypoxia. EXAM: PORTABLE CHEST 1 VIEW COMPARISON:  Chest x-ray dated September 17, 2019. FINDINGS: Unchanged right chest wall port catheter. The heart size and mediastinal contours are within normal limits. Normal pulmonary vascularity. Low lung volumes. No focal consolidation, pleural effusion, or pneumothorax. No acute osseous abnormality. IMPRESSION: No active disease. Electronically Signed   By: Titus Dubin M.D.   On: 10/24/2019 17:05   DG HIP UNILAT WITH PELVIS 2-3 VIEWS LEFT  Result Date: 10/25/2019 CLINICAL DATA:  Hip pain, no injury EXAM: DG HIP (WITH OR WITHOUT PELVIS) 2-3V LEFT COMPARISON:  03/07/2019 FINDINGS: Status post right hip total arthroplasty. There is no evidence of hip fracture or  dislocation. There is no evidence of arthropathy or other focal bone abnormality. IMPRESSION: 1.  No fracture or dislocation of the left hip. No significant arthrosis. 2.  Status post right hip total arthroplasty. Electronically Signed   By: Eddie Candle M.D.   On: 10/25/2019 12:46   VAS [place] LOWER EXTREMITY VENOUS (DVT)  Result Date: 10/24/2019  Lower Venous DVTStudy Indications: Swelling.  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 Anticoagulation: Eliquis. Limitations: Body habitus and poor ultrasound/tissue interface. Comparison Study: Left LEV 08-25-19, negative. Performing Technologist: Baldwin Crown ARDMS, RVT  Examination Guidelines: A complete evaluation includes B-mode imaging, spectral Doppler, color Doppler, and power Doppler as needed of all accessible portions of each vessel. Bilateral testing is considered an integral part of a complete examination. Limited examinations for reoccurring indications may be performed as noted. The reflux portion of the exam is performed with the patient in reverse Trendelenburg.  +-----+---------------+---------+-----------+----------+--------------+ RIGHTCompressibilityPhasicitySpontaneityPropertiesThrombus Aging +-----+---------------+---------+-----------+----------+--------------+ CFV  Full           Yes      Yes                                 +-----+---------------+---------+-----------+----------+--------------+   +---------+---------------+---------+-----------+----------+-----------------+ LEFT     CompressibilityPhasicitySpontaneityPropertiesThrombus Aging    +---------+---------------+---------+-----------+----------+-----------------+ CFV      Full           Yes      Yes                                    +---------+---------------+---------+-----------+----------+-----------------+ SFJ      Full                                                           +---------+---------------+---------+-----------+----------+-----------------+ FV Prox  Full                                                            +---------+---------------+---------+-----------+----------+-----------------+ FV Mid   Full                                                           +---------+---------------+---------+-----------+----------+-----------------+ FV DistalFull                                         poorly visualized +---------+---------------+---------+-----------+----------+-----------------+ PFV      Full                                                           +---------+---------------+---------+-----------+----------+-----------------+ POP      Full  Yes      Yes                                    +---------+---------------+---------+-----------+----------+-----------------+ PTV                                                   poorly visualized +---------+---------------+---------+-----------+----------+-----------------+ PERO                                                  poorly visualized +---------+---------------+---------+-----------+----------+-----------------+ Interstitial fluid seen in anterior distal lower extremity.    Summary: RIGHT: - No evidence of common femoral vein obstruction.  LEFT: - There is no evidence of deep vein thrombosis in the lower extremity. However, portions of this examination were limited- see technologist comments above.  - No cystic structure found in the popliteal fossa.  *See table(s) above for measurements and observations. Electronically signed by Monica Martinez MD on 10/24/2019 at 5:25:14 PM.    Final      Time Spent in minutes  30     Desiree Hane M.D on 10/29/2019 at 6:53 PM  To page go to [email or web address] - password [email or web address]

## 2019-10-30 DIAGNOSIS — D689 Coagulation defect, unspecified: Secondary | ICD-10-CM | POA: Diagnosis present

## 2019-10-30 DIAGNOSIS — R627 Adult failure to thrive: Secondary | ICD-10-CM | POA: Diagnosis not present

## 2019-10-30 DIAGNOSIS — N390 Urinary tract infection, site not specified: Secondary | ICD-10-CM | POA: Diagnosis not present

## 2019-10-30 DIAGNOSIS — F411 Generalized anxiety disorder: Secondary | ICD-10-CM | POA: Diagnosis not present

## 2019-10-30 DIAGNOSIS — K828 Other specified diseases of gallbladder: Secondary | ICD-10-CM | POA: Diagnosis not present

## 2019-10-30 DIAGNOSIS — E1121 Type 2 diabetes mellitus with diabetic nephropathy: Secondary | ICD-10-CM | POA: Diagnosis not present

## 2019-10-30 DIAGNOSIS — E874 Mixed disorder of acid-base balance: Secondary | ICD-10-CM | POA: Diagnosis present

## 2019-10-30 DIAGNOSIS — Z6836 Body mass index (BMI) 36.0-36.9, adult: Secondary | ICD-10-CM | POA: Diagnosis not present

## 2019-10-30 DIAGNOSIS — Z1612 Extended spectrum beta lactamase (ESBL) resistance: Secondary | ICD-10-CM | POA: Diagnosis not present

## 2019-10-30 DIAGNOSIS — K767 Hepatorenal syndrome: Secondary | ICD-10-CM | POA: Diagnosis not present

## 2019-10-30 DIAGNOSIS — I361 Nonrheumatic tricuspid (valve) insufficiency: Secondary | ICD-10-CM | POA: Diagnosis not present

## 2019-10-30 DIAGNOSIS — Z8616 Personal history of COVID-19: Secondary | ICD-10-CM | POA: Diagnosis not present

## 2019-10-30 DIAGNOSIS — I5032 Chronic diastolic (congestive) heart failure: Secondary | ICD-10-CM | POA: Diagnosis present

## 2019-10-30 DIAGNOSIS — R404 Transient alteration of awareness: Secondary | ICD-10-CM | POA: Diagnosis not present

## 2019-10-30 DIAGNOSIS — R000 Tachycardia, unspecified: Secondary | ICD-10-CM | POA: Diagnosis not present

## 2019-10-30 DIAGNOSIS — A09 Infectious gastroenteritis and colitis, unspecified: Secondary | ICD-10-CM | POA: Diagnosis not present

## 2019-10-30 DIAGNOSIS — E785 Hyperlipidemia, unspecified: Secondary | ICD-10-CM | POA: Diagnosis present

## 2019-10-30 DIAGNOSIS — E872 Acidosis: Secondary | ICD-10-CM | POA: Diagnosis not present

## 2019-10-30 DIAGNOSIS — J9601 Acute respiratory failure with hypoxia: Secondary | ICD-10-CM | POA: Diagnosis not present

## 2019-10-30 DIAGNOSIS — G9341 Metabolic encephalopathy: Secondary | ICD-10-CM | POA: Diagnosis not present

## 2019-10-30 DIAGNOSIS — R4182 Altered mental status, unspecified: Secondary | ICD-10-CM | POA: Diagnosis not present

## 2019-10-30 DIAGNOSIS — B948 Sequelae of other specified infectious and parasitic diseases: Secondary | ICD-10-CM | POA: Diagnosis not present

## 2019-10-30 DIAGNOSIS — R110 Nausea: Secondary | ICD-10-CM | POA: Diagnosis not present

## 2019-10-30 DIAGNOSIS — M2550 Pain in unspecified joint: Secondary | ICD-10-CM | POA: Diagnosis not present

## 2019-10-30 DIAGNOSIS — I10 Essential (primary) hypertension: Secondary | ICD-10-CM | POA: Diagnosis not present

## 2019-10-30 DIAGNOSIS — E1142 Type 2 diabetes mellitus with diabetic polyneuropathy: Secondary | ICD-10-CM | POA: Diagnosis not present

## 2019-10-30 DIAGNOSIS — R5381 Other malaise: Secondary | ICD-10-CM | POA: Diagnosis not present

## 2019-10-30 DIAGNOSIS — Z85028 Personal history of other malignant neoplasm of stomach: Secondary | ICD-10-CM | POA: Diagnosis not present

## 2019-10-30 DIAGNOSIS — I9589 Other hypotension: Secondary | ICD-10-CM | POA: Diagnosis present

## 2019-10-30 DIAGNOSIS — Z87440 Personal history of urinary (tract) infections: Secondary | ICD-10-CM | POA: Diagnosis not present

## 2019-10-30 DIAGNOSIS — Z7401 Bed confinement status: Secondary | ICD-10-CM | POA: Diagnosis not present

## 2019-10-30 DIAGNOSIS — N179 Acute kidney failure, unspecified: Secondary | ICD-10-CM | POA: Diagnosis not present

## 2019-10-30 DIAGNOSIS — Z20822 Contact with and (suspected) exposure to covid-19: Secondary | ICD-10-CM | POA: Diagnosis not present

## 2019-10-30 DIAGNOSIS — D509 Iron deficiency anemia, unspecified: Secondary | ICD-10-CM | POA: Diagnosis not present

## 2019-10-30 DIAGNOSIS — I2510 Atherosclerotic heart disease of native coronary artery without angina pectoris: Secondary | ICD-10-CM | POA: Diagnosis not present

## 2019-10-30 DIAGNOSIS — R918 Other nonspecific abnormal finding of lung field: Secondary | ICD-10-CM | POA: Diagnosis not present

## 2019-10-30 DIAGNOSIS — I82501 Chronic embolism and thrombosis of unspecified deep veins of right lower extremity: Secondary | ICD-10-CM | POA: Diagnosis not present

## 2019-10-30 DIAGNOSIS — E440 Moderate protein-calorie malnutrition: Secondary | ICD-10-CM | POA: Diagnosis present

## 2019-10-30 DIAGNOSIS — Z7189 Other specified counseling: Secondary | ICD-10-CM | POA: Diagnosis not present

## 2019-10-30 DIAGNOSIS — E119 Type 2 diabetes mellitus without complications: Secondary | ICD-10-CM | POA: Diagnosis not present

## 2019-10-30 DIAGNOSIS — Z66 Do not resuscitate: Secondary | ICD-10-CM | POA: Diagnosis not present

## 2019-10-30 DIAGNOSIS — N183 Chronic kidney disease, stage 3 unspecified: Secondary | ICD-10-CM | POA: Diagnosis not present

## 2019-10-30 DIAGNOSIS — R52 Pain, unspecified: Secondary | ICD-10-CM | POA: Diagnosis not present

## 2019-10-30 DIAGNOSIS — A419 Sepsis, unspecified organism: Secondary | ICD-10-CM | POA: Diagnosis not present

## 2019-10-30 DIAGNOSIS — R531 Weakness: Secondary | ICD-10-CM | POA: Diagnosis not present

## 2019-10-30 DIAGNOSIS — M6281 Muscle weakness (generalized): Secondary | ICD-10-CM | POA: Diagnosis not present

## 2019-10-30 DIAGNOSIS — I959 Hypotension, unspecified: Secondary | ICD-10-CM | POA: Diagnosis not present

## 2019-10-30 DIAGNOSIS — B9629 Other Escherichia coli [E. coli] as the cause of diseases classified elsewhere: Secondary | ICD-10-CM | POA: Diagnosis not present

## 2019-10-30 DIAGNOSIS — I130 Hypertensive heart and chronic kidney disease with heart failure and stage 1 through stage 4 chronic kidney disease, or unspecified chronic kidney disease: Secondary | ICD-10-CM | POA: Diagnosis present

## 2019-10-30 DIAGNOSIS — K7291 Hepatic failure, unspecified with coma: Secondary | ICD-10-CM | POA: Diagnosis not present

## 2019-10-30 DIAGNOSIS — K760 Fatty (change of) liver, not elsewhere classified: Secondary | ICD-10-CM | POA: Diagnosis not present

## 2019-10-30 DIAGNOSIS — R0902 Hypoxemia: Secondary | ICD-10-CM | POA: Diagnosis not present

## 2019-10-30 DIAGNOSIS — F332 Major depressive disorder, recurrent severe without psychotic features: Secondary | ICD-10-CM | POA: Diagnosis not present

## 2019-10-30 DIAGNOSIS — N170 Acute kidney failure with tubular necrosis: Secondary | ICD-10-CM | POA: Diagnosis not present

## 2019-10-30 DIAGNOSIS — J189 Pneumonia, unspecified organism: Secondary | ICD-10-CM | POA: Diagnosis not present

## 2019-10-30 DIAGNOSIS — R6521 Severe sepsis with septic shock: Secondary | ICD-10-CM | POA: Diagnosis not present

## 2019-10-30 DIAGNOSIS — K7290 Hepatic failure, unspecified without coma: Secondary | ICD-10-CM | POA: Diagnosis present

## 2019-10-30 DIAGNOSIS — E1165 Type 2 diabetes mellitus with hyperglycemia: Secondary | ICD-10-CM | POA: Diagnosis not present

## 2019-10-30 DIAGNOSIS — Z515 Encounter for palliative care: Secondary | ICD-10-CM | POA: Diagnosis not present

## 2019-10-30 LAB — CULTURE, BLOOD (SINGLE)
Culture: NO GROWTH
Special Requests: ADEQUATE

## 2019-10-30 LAB — GLUCOSE, CAPILLARY
Glucose-Capillary: 120 mg/dL — ABNORMAL HIGH (ref 70–99)
Glucose-Capillary: 132 mg/dL — ABNORMAL HIGH (ref 70–99)
Glucose-Capillary: 204 mg/dL — ABNORMAL HIGH (ref 70–99)

## 2019-10-30 LAB — MAGNESIUM: Magnesium: 2.1 mg/dL (ref 1.7–2.4)

## 2019-10-30 LAB — SARS CORONAVIRUS 2 (TAT 6-24 HRS): SARS Coronavirus 2: NEGATIVE

## 2019-10-30 MED ORDER — TRAMADOL HCL 50 MG PO TABS: 50.0000 mg | ORAL_TABLET | Freq: Three times a day (TID) | ORAL | 0 refills | Status: AC | PRN

## 2019-10-30 MED ORDER — LIDOCAINE VISCOUS HCL 2 % MT SOLN
15.0000 mL | Freq: Three times a day (TID) | OROMUCOSAL | Status: DC
  Administered 2019-10-30: 15 mL via OROMUCOSAL
  Filled 2019-10-30: qty 15

## 2019-10-30 MED ORDER — HEPARIN SOD (PORK) LOCK FLUSH 100 UNIT/ML IV SOLN
500.0000 [IU] | INTRAVENOUS | Status: AC | PRN
  Administered 2019-10-30: 500 [IU]
  Filled 2019-10-30: qty 5

## 2019-10-30 NOTE — Plan of Care (Signed)

## 2019-10-30 NOTE — Progress Notes (Signed)
DISCHARGE NOTE [name] to be discharged Skilled nursing facility per MD order. Patient verbalized understanding.  Skin clean, dry and intact without evidence of skin break down, no evidence of skin tears noted. IV catheter discontinued intact. Site without signs and symptoms of complications. Dressing and pressure applied. Pt denies pain at the site currently. No complaints noted.  Patient free of lines, drains.   Discharge packet assembled. An After Visit Summary (AVS) was printed and given to the EMS personnel. Patient escorted via stretcher and discharged to [facility] via ambulance. Report called to accepting facility; all questions and concerns addressed.   All scheduled medications given for tonight.  2 belonging bags sent with patient.   Viviano Simas, RN

## 2019-10-30 NOTE — Discharge Summary (Signed)
Physician Discharge Summary  [name] HYH:[id number] DOB: June 08, 1951 DOA: 10/22/2019  PCP: Ann Held, DO  Admit date: 10/22/2019 Discharge date: 10/30/2019   Admitted From: Home Disposition: Skilled nursing facility  Recommendations for Outpatient Follow-up:  1. Follow up with PCP in 1-2 weeks 2. Please obtain BMP/CBC in one week   Discharge Condition: Fair CODE STATUS: Full code Diet recommendation: Low-carb diet  Discharge summary: [name] a 69 y.o.year old femalewith medical history significant for stomach CA in remission, DM2, hypotension on midodrine, COVID 19 infection with no need for treatment but acute DVT (07/2019 now on Eliquis,  who presented on 3/30/2021with worsening abdominal pain. She was admitted with working diagnosis of UTI and concern for infectious colitis.  Hospital Course:Initially empirically treated with meropenem for mild diffuse colitis in setting of recent ESBL UTI. On 4/2 ct chest for hypoxia (SpO 279% on 2 L)confirmed ground glass airspace disease that either representing multifocal pneumonia or postinflammatory scarring from recent COVID-19 infection (January 2021). Patient was started on empiric vancomycin on 4/2 in addition to meropenem given positive MRSA swab and above findings. Given no growth on blood cultures patient was weaned to doxycycline on 4/5 but suspect lung findings on CT chest were sequelae of prior COVID infection.   Notable history for recent hospitalizations:  1/14-1/22/2021 for generalized weakness and recurrent falls after recent hospitalization for Covid infection periumbilical rash, AKI on CKD due to dehydration/poor appetite/nausea and orthostatic hypotension responsive to IV fluids patient was discharged home, declining SNF recommendations by PT/OT  2/23-3/5 for septic shock requiring pressors in ICU from ESBL E. coli UTI and relative adrenal insufficiency and possible aspiration pneumonia requiring  pressors. Palliative discussions were initiated at that time to assist with [place] and symptom management. Family and patient reported they would reach out to palliative when they were ready to continue discussions  # Acute hypoxic respiratory failure with groundglass airspace disease: [place]ela of COVID-19 infection.  Currently no evidence of repeat infection.  Empirically treated with meropenem and doxycycline.  Finished 7 days of meropenem and doxycycline. Will stop all antibiotics. Blood cultures negative.  # Acute UTI present on admission due to ESBL E. coli: Meropenem day 7/7.  Probably colonized.  # Acute on chronic hypotension: Patient on midodrine.  Currently stable.  # GERD/gastritis with treatment esophageal dysmotility: Recent barium swallow.  EGD on 08/2019 without a stricture.  Bariatric diet.  Frequent eating.  Reglan as needed.  # Decreased oral appetite, failure to thrive, moderate protein calorie malnutrition: Due to recurrent hospitalizations and illnesses.  Seen by nutrition.  Increase nutrition.  # AKI on CKD stage III: Likely multifactorial.  Treated with IV fluids with improvement.  # Hyperkalemia: Resolved.  # Type 2 diabetes, A1c 5.6.  With peripheral neuropathy. Resume metformin.   # History of coronary artery disease status post PCI stenting 08/2018: Continue Lopressor.  # Recent DVT after COVID-19 infection: On Eliquis.  Treat for 6 months.  Tolerating.  Profound physical deconditioning: Work with PT OT.  She will benefit with going back to inpatient physical therapy at the skilled nursing facility.   Discharge Diagnoses:  Principal Problem:   Urinary tract infection due to extended-spectrum beta lactamase (ESBL) producing Escherichia coli Active Problems:   Diabetic polyneuropathy associated with type 2 diabetes mellitus (HCC)   Chronic pain syndrome   CKD (chronic kidney disease) stage 3, GFR 30-59 ml/min (HCC)   Hypotension   Hyperlipidemia  associated with type 2 diabetes mellitus ([place])  Colitis   AKI (acute kidney injury) ([place])   History of DVT (deep vein thrombosis)   UTI due to extended-spectrum beta lactamase (ESBL) producing Escherichia coli   Acute metabolic encephalopathy   Hypoxia   Ground glass opacity present on imaging of lung   Acute respiratory failure with hypoxia (HCC)   Decreased oral intake    Discharge Instructions  Discharge Instructions    Diet Carb Modified   Complete by: As directed    Increase activity slowly   Complete by: As directed      Allergies as of 10/30/2019      Reactions   Cephalexin Itching, Rash, Other (See Comments)   Sensation of throat closing. Tolerated pip/tazo, cefepime, ceftriaxone before    Adhesive [tape] Other (See Comments)   Burn Skin   Codeine Other (See Comments)   paranoid   Zocor [simvastatin - High Dose] Other (See Comments)   Muscle spasms      Medication List    TAKE these medications   acetaminophen 500 MG tablet Commonly known as: TYLENOL Take 2 tablets (1,000 mg total) by mouth every 8 (eight) hours. What changed:   when to take this  reasons to take this   albuterol (2.5 MG/3ML) 0.083% nebulizer solution Commonly known as: PROVENTIL Take 3 mLs (2.5 mg total) by nebulization every 6 (six) hours as needed for wheezing or shortness of breath.   albuterol 108 (90 Base) MCG/ACT inhaler Commonly known as: VENTOLIN HFA Inhale 2 puffs into the lungs every 6 (six) hours as needed for wheezing or shortness of breath.   apixaban 5 MG Tabs tablet Commonly known as: ELIQUIS Take 1 tablet (5 mg total) by mouth 2 (two) times daily.   Centrum Silver Adult 50+ Tabs Take 1 tablet once a day What changed:   how much to take  how to take this  when to take this   gabapentin 300 MG capsule Commonly known as: NEURONTIN Take 300 mg by mouth See admin instructions. 300 mg in the morning, 600 mg at bedtime   glucose blood test strip Commonly known  as: OneTouch Verio Use as instructed to check blood sugar 3 times a day What changed:   how much to take  how to take this  when to take this   lidocaine-prilocaine cream Commonly known as: EMLA Apply 1 application topically as needed. What changed: reasons to take this   metFORMIN 500 MG tablet Commonly known as: GLUCOPHAGE Take 1 tablet (500 mg total) by mouth 2 (two) times daily with a meal.   metoCLOPramide 5 MG tablet Commonly known as: REGLAN Take 1 tablet (5 mg total) by mouth every 8 (eight) hours as needed for nausea (before meals as needed for nausea.).   metoprolol tartrate 25 MG tablet Commonly known as: LOPRESSOR Take 0.5 tablets (12.5 mg total) by mouth 2 (two) times daily.   midodrine 10 MG tablet Commonly known as: PROAMATINE Take 1 tablet (10 mg total) by mouth 3 (three) times daily with meals.   nitroGLYCERIN 0.4 MG SL tablet Commonly known as: Nitrostat Place 1 tablet (0.4 mg total) under the tongue every 5 (five) minutes as needed. What changed: reasons to take this   nystatin powder Commonly known as: MYCOSTATIN/NYSTOP Apply topically 3 (three) times daily.   ondansetron 4 MG tablet Commonly known as: ZOFRAN Take 1 tablet (4 mg total) by mouth every 6 (six) hours as needed for nausea.   oxybutynin 5 MG 24 hr tablet Commonly known as:  DITROPAN-XL Take 5 mg by mouth daily.   pantoprazole 40 MG tablet Commonly known as: PROTONIX Take 1 tablet (40 mg total) by mouth daily.   rosuvastatin 20 MG tablet Commonly known as: CRESTOR Take 1 tablet (20 mg total) by mouth daily at 6 PM.   sertraline 50 MG tablet Commonly known as: ZOLOFT Take 1 tablet (50 mg total) by mouth daily.   traMADol 50 MG tablet Commonly known as: ULTRAM Take 1 tablet (50 mg total) by mouth every 8 (eight) hours as needed for up to 3 days for moderate pain.   traZODone 50 MG tablet Commonly known as: DESYREL Take 0.5 tablets (25 mg total) by mouth at bedtime as needed  for sleep.   triamcinolone ointment 0.1 % Commonly known as: KENALOG Apply 1 application topically 2 (two) times daily. For 14 days beginning 10/08/2019      Contact information for after-discharge care    Destination    [facility] Preferred SNF .   Service: Skilled Nursing Contact information: [address] [address] [phone]             Allergies  Allergen Reactions  . Cephalexin Itching, Rash and Other (See Comments)    Sensation of throat closing. Tolerated pip/tazo, cefepime, ceftriaxone before   . Adhesive [Tape] Other (See Comments)    Burn Skin  . Codeine Other (See Comments)    paranoid  . Zocor [Simvastatin - High Dose] Other (See Comments)    Muscle spasms    Procedures/Studies: CT ABDOMEN PELVIS WO CONTRAST  Result Date: 10/22/2019 CLINICAL DATA:  Acute nonlocalized abdominal pain EXAM: CT ABDOMEN AND PELVIS WITHOUT CONTRAST TECHNIQUE: Multidetector CT imaging of the abdomen and pelvis was performed following the standard protocol without IV contrast. COMPARISON:  09/20/2019, 08/08/2019 FINDINGS: Lower chest: Patchy bilateral lower lobe ground-glass airspace disease demonstrates minimal improvement. There is now a small left pleural effusion. Hepatobiliary: Stable severe fatty infiltration of the liver. No focal abnormality on this unenhanced exam. The gallbladder is distended without evidence of cholelithiasis or cholecystitis. Pancreas: Diffuse atrophy, stable.  No focal abnormality. Spleen: Normal in size without focal abnormality. Adrenals/Urinary Tract: No urinary tract calculi or obstructive uropathy. Mild left renal atrophy unchanged. Bladder is unremarkable. The adrenals are normal. Stomach/Bowel: There is mild diffuse colonic wall thickening which may be inflammatory or infectious. No bowel obstruction or ileus. 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 Vascular/Lymphatic: Aortic atherosclerosis.  No enlarged abdominal or pelvic lymph nodes. Reproductive: Uterus and bilateral adnexa are unremarkable. Other: There is a small amount of free fluid within the pelvis, nonspecific. No free gas. Postsurgical changes are seen from previous midline laparotomy. Body wall edema is seen throughout the lower abdomen and pelvis. Musculoskeletal: Right hip arthroplasty is identified. No acute or destructive bony lesions. Reconstructed images demonstrate no additional findings. IMPRESSION: 1. Mild diffuse colonic wall thickening which may reflect inflammatory or infectious colitis. 2. Persistent bibasilar ground-glass airspace disease compatible with multifocal pneumonia. New small left effusion. 3. Small amount of free fluid within the pelvis, nonspecific. 4. Diffuse fatty infiltration of the liver. Electronically Signed   By: Randa Ngo M.D.   On: 10/22/2019 21:24   CT CHEST WO CONTRAST  Result Date: 10/25/2019 CLINICAL DATA:  Hypoxia, multifocal pneumonia on abdominal CT, history of COVID-13 August 2019 EXAM: CT CHEST WITHOUT CONTRAST TECHNIQUE: Multidetector CT imaging of the chest was performed following the standard protocol without IV contrast. COMPARISON:  10/24/2019, 10/22/2019, 11/02/2017 FINDINGS: Cardiovascular:  The heart is normal in size without pericardial effusion. There is severe atherosclerosis of the coronary vasculature. Mild atherosclerosis of the aortic arch. Thoracic aorta is normal in caliber. Mediastinum/Nodes: No pathologic adenopathy. Thyroid, trachea, and esophagus are stable. Lungs/Pleura: There are patchy bilateral areas of ground-glass airspace disease. Overall, in comparison to abdominal CT 08/08/2019, there is only minimal interval improvement. Small bilateral pleural effusions have developed. No pneumothorax. The central airways are patent. Upper Abdomen: Stable fatty infiltration of the liver. Small amount of ascites now seen within the upper abdomen. Musculoskeletal: There is mild  diffuse subcutaneous edema. No acute displaced fracture. Reconstructed images demonstrate no additional findings. IMPRESSION: 1. Persistent multifocal areas of ground-glass airspace disease. This could reflect persistent pneumonia or postinflammatory scarring, given history of COVID-19 infection January 2021. 2. Interval development of small bilateral pleural effusions, volume estimated less than 500 cc each. 3. Interval development of small volume ascites within the upper abdomen. 4. Fatty liver. 5. Aortic Atherosclerosis (ICD10-I70.0). Severe coronary atherosclerosis. Electronically Signed   By: Randa Ngo M.D.   On: 10/25/2019 17:11   DG CHEST PORT 1 VIEW  Result Date: 10/24/2019 CLINICAL DATA:  Hypoxia. EXAM: PORTABLE CHEST 1 VIEW COMPARISON:  Chest x-ray dated September 17, 2019. FINDINGS: Unchanged right chest wall port catheter. The heart size and mediastinal contours are within normal limits. Normal pulmonary vascularity. Low lung volumes. No focal consolidation, pleural effusion, or pneumothorax. No acute osseous abnormality. IMPRESSION: No active disease. Electronically Signed   By: Titus Dubin M.D.   On: 10/24/2019 17:05   DG HIP UNILAT WITH PELVIS 2-3 VIEWS LEFT  Result Date: 10/25/2019 CLINICAL DATA:  Hip pain, no injury EXAM: DG HIP (WITH OR WITHOUT PELVIS) 2-3V LEFT COMPARISON:  03/07/2019 FINDINGS: Status post right hip total arthroplasty. There is no evidence of hip fracture or dislocation. There is no evidence of arthropathy or other focal bone abnormality. IMPRESSION: 1. No fracture or dislocation of the left hip. No significant arthrosis. 2.  Status post right hip total arthroplasty. Electronically Signed   By: Eddie Candle M.D.   On: 10/25/2019 12:46   VAS [place] LOWER EXTREMITY VENOUS (DVT)  Result Date: 10/24/2019  Lower Venous DVTStudy Indications: Swelling.  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 Anticoagulation: Eliquis. Limitations: Body habitus and poor ultrasound/tissue interface.  Comparison Study: Left LEV 08-25-19, negative. Performing Technologist: Baldwin Crown ARDMS, RVT  Examination Guidelines: A complete evaluation includes B-mode imaging, spectral Doppler, color Doppler, and power Doppler as needed of all accessible portions of each vessel. Bilateral testing is considered an integral part of a complete examination. Limited examinations for reoccurring indications may be performed as noted. The reflux portion of the exam is performed with the patient in reverse Trendelenburg.  +-----+---------------+---------+-----------+----------+--------------+ RIGHTCompressibilityPhasicitySpontaneityPropertiesThrombus Aging +-----+---------------+---------+-----------+----------+--------------+ CFV  Full           Yes      Yes                                 +-----+---------------+---------+-----------+----------+--------------+   +---------+---------------+---------+-----------+----------+-----------------+ LEFT     CompressibilityPhasicitySpontaneityPropertiesThrombus Aging    +---------+---------------+---------+-----------+----------+-----------------+ CFV      Full           Yes      Yes                                    +---------+---------------+---------+-----------+----------+-----------------+ SFJ  Full                                                           +---------+---------------+---------+-----------+----------+-----------------+ FV Prox  Full                                                           +---------+---------------+---------+-----------+----------+-----------------+ FV Mid   Full                                                           +---------+---------------+---------+-----------+----------+-----------------+ FV DistalFull                                         poorly visualized +---------+---------------+---------+-----------+----------+-----------------+ PFV      Full                                                            +---------+---------------+---------+-----------+----------+-----------------+ POP      Full           Yes      Yes                                    +---------+---------------+---------+-----------+----------+-----------------+ PTV                                                   poorly visualized +---------+---------------+---------+-----------+----------+-----------------+ PERO                                                  poorly visualized +---------+---------------+---------+-----------+----------+-----------------+ Interstitial fluid seen in anterior distal lower extremity.    Summary: RIGHT: - No evidence of common femoral vein obstruction.  LEFT: - There is no evidence of deep vein thrombosis in the lower extremity. However, portions of this examination were limited- see technologist comments above.  - No cystic structure found in the popliteal fossa.  *See table(s) above for measurements and observations. Electronically signed by Monica Martinez MD on 10/24/2019 at 5:25:14 PM.    Final      Subjective: Patient seen and examined.  She definitely wants to go to rehab.  No other complaints.   Discharge Exam: Vitals:   10/30/19 0600 10/30/19 0844  BP: 96/62 93/72  Pulse: 84 95  Resp: 14 18  Temp: 98.7 F (37.1 C) 98.2 F (36.8  C)  SpO2: 93% 99%   Vitals:   10/29/19 2126 10/29/19 2156 10/30/19 0600 10/30/19 0844  BP: (!) 86/64 103/67 96/62 93/72   Pulse: 84  84 95  Resp: 16  14 18   Temp: 98.6 F (37 C)  98.7 F (37.1 C) 98.2 F (36.8 C)  TempSrc: Oral  Oral Oral  SpO2: 100%  93% 99%  Weight:  99.3 kg    Height:         General exam: Appears calm and comfortable, chronically sick looking.  Not in any distress.  On room air. Respiratory system: Clear to auscultation. Respiratory effort normal. Cardiovascular system: S1 & S2 heard, RRR. No JVD, murmurs, rubs, gallops or clicks.  Trace pedal edema bilateral.    Gastrointestinal system: Abdomen is nondistended, soft and nontender.  Central nervous system: Alert and oriented. No focal neurological deficits. Extremities: Symmetric 5 x 5 power. Skin: No rashes, lesions or ulcers Psychiatry: Judgement and insight appear normal. Mood & affect flat and anxious.    The results of significant diagnostics from this hospitalization (including imaging, microbiology, ancillary and laboratory) are listed below for reference.     Microbiology: Recent Results (from the past 240 hour(s))  Urine culture     Status: Abnormal   Collection Time: 10/22/19  8:01 PM   Specimen: Urine, Random  Result Value Ref Range Status   Specimen Description URINE, RANDOM  Final   Special Requests   Final    NONE Performed at [facility], [address] [place], [place] [address]    Culture (A)  Final    >=100,000 COLONIES/mL ESCHERICHIA COLI Confirmed Extended Spectrum Beta-Lactamase Producer (ESBL).  In bloodstream infections from ESBL organisms, carbapenems are preferred over piperacillin/tazobactam. They are shown to have a lower risk of mortality. 40,000 COLONIES/mL VANCOMYCIN RESISTANT ENTEROCOCCUS    Report Status 10/26/2019 FINAL  Final   Organism ID, Bacteria ESCHERICHIA COLI (A)  Final   Organism ID, Bacteria VANCOMYCIN RESISTANT ENTEROCOCCUS (A)  Final      Susceptibility   Escherichia coli - MIC*    AMPICILLIN >=32 RESISTANT Resistant     CEFAZOLIN >=64 RESISTANT Resistant     CEFTRIAXONE >=64 RESISTANT Resistant     CIPROFLOXACIN >=4 RESISTANT Resistant     GENTAMICIN <=1 SENSITIVE Sensitive     IMIPENEM <=0.25 SENSITIVE Sensitive     NITROFURANTOIN <=16 SENSITIVE Sensitive     TRIMETH/SULFA >=320 RESISTANT Resistant     AMPICILLIN/SULBACTAM 8 SENSITIVE Sensitive     PIP/TAZO <=4 SENSITIVE Sensitive     * >=100,000 COLONIES/mL ESCHERICHIA COLI   Vancomycin resistant enterococcus - MIC*    AMPICILLIN >=32 RESISTANT Resistant     NITROFURANTOIN  64 INTERMEDIATE Intermediate     VANCOMYCIN >=32 RESISTANT Resistant     LINEZOLID 2 SENSITIVE Sensitive     * 40,000 COLONIES/mL VANCOMYCIN RESISTANT ENTEROCOCCUS  SARS CORONAVIRUS 2 (TAT 6-24 HRS) Nasopharyngeal Nasopharyngeal Swab     Status: None   Collection Time: 10/23/19  2:19 AM   Specimen: Nasopharyngeal Swab  Result Value Ref Range Status   SARS Coronavirus 2 NEGATIVE NEGATIVE Final    Comment: (NOTE) SARS-CoV-2 target nucleic acids are NOT DETECTED. The SARS-CoV-2 RNA is generally detectable in upper and lower respiratory specimens during the acute phase of infection. Negative results do not preclude SARS-CoV-2 infection, do not rule out co-infections with other pathogens, and should not be used as the sole basis for treatment or other patient management decisions. Negative results must be  combined with clinical observations, patient history, and epidemiological information. The expected result is Negative. Fact Sheet for Patients: SugarRoll.be Fact Sheet for Healthcare Providers: https://www.woods-mathews.com/ This test is not yet approved or cleared by the [place] FDA and  has been authorized for detection and/or diagnosis of SARS-CoV-2 by FDA under an Emergency Use Authorization (EUA). This EUA will remain  in effect (meaning this test can be used) for the duration of the COVID-19 declaration under Section 56 4(b)(1) of the Act, 21 U.S.C. section 360bbb-3(b)(1), unless the authorization is terminated or revoked sooner. Performed at [facility], [place] [address] [place], [place] [address]   MRSA PCR Screening     Status: Abnormal   Collection Time: 10/24/19  1:54 PM   Specimen: Nasopharyngeal  Result Value Ref Range Status   MRSA by PCR POSITIVE (A) NEGATIVE Final    Comment:        The GeneXpert MRSA Assay (FDA approved for NASAL specimens only), is one component of a comprehensive MRSA  colonization surveillance program. It is not intended to diagnose MRSA infection nor to guide or monitor treatment for MRSA infections. RESULT CALLED TO, READ BACK BY AND VERIFIED WITH: Garlon Hatchet RN 15:50 10/24/19 ([email or web address]) Performed at [facility], [place] [address] [place], [place] [address]   Culture, blood (Routine X 2) w Reflex to ID Panel     Status: None   Collection Time: 10/24/19  8:19 PM   Specimen: BLOOD  Result Value Ref Range Status   Specimen Description BLOOD RIGHT ANTECUBITAL  Final   Special Requests   Final    BOTTLES DRAWN AEROBIC AND ANAEROBIC Blood Culture adequate volume   Culture   Final    NO GROWTH 5 DAYS Performed at [facility], [place] [address] [place], [place] [address]    Report Status 10/29/2019 FINAL  Final  Culture, blood (single)     Status: None   Collection Time: 10/25/19  7:20 AM   Specimen: BLOOD  Result Value Ref Range Status   Specimen Description BLOOD LEFT ANTECUBITAL  Final   Special Requests   Final    BOTTLES DRAWN AEROBIC AND ANAEROBIC Blood Culture adequate volume   Culture   Final    NO GROWTH 5 DAYS Performed at [facility], [place] [address] [place], [place] [address]    Report Status 10/30/2019 FINAL  Final  GI pathogen panel by PCR, stool     Status: None   Collection Time: 10/26/19 11:00 AM   Specimen: Stool  Result Value Ref Range Status   Plesiomonas shigelloides NOT DETECTED NOT DETECTED Final   Yersinia enterocolitica NOT DETECTED NOT DETECTED Final   Vibrio NOT DETECTED NOT DETECTED Final   Enteropathogenic E coli NOT DETECTED NOT DETECTED Final   E coli (ETEC) LT/ST NOT DETECTED NOT DETECTED Final   E coli 2836 by PCR Not applicable NOT DETECTED Final   Cryptosporidium by PCR NOT DETECTED NOT DETECTED Final   Entamoeba histolytica NOT DETECTED NOT DETECTED Final   Adenovirus F 40/41 NOT DETECTED NOT DETECTED Final   Norovirus GI/GII NOT DETECTED NOT DETECTED Final   Sapovirus NOT DETECTED NOT  DETECTED Final    Comment: (NOTE) Performed At: [facility] [place], [place] [id number] Rush Farmer MD TK:[id number]    Vibrio cholerae NOT DETECTED NOT DETECTED Final   Campylobacter by PCR NOT DETECTED NOT DETECTED Final   Salmonella by PCR NOT DETECTED NOT DETECTED Final  E coli (STEC) NOT DETECTED NOT DETECTED Final   Enteroaggregative E coli NOT DETECTED NOT DETECTED Final   Shigella by PCR NOT DETECTED NOT DETECTED Final   Cyclospora cayetanensis NOT DETECTED NOT DETECTED Final   Astrovirus NOT DETECTED NOT DETECTED Final   G lamblia by PCR NOT DETECTED NOT DETECTED Final   Rotavirus A by PCR NOT DETECTED NOT DETECTED Final  SARS CORONAVIRUS 2 (TAT 6-24 HRS) Nasopharyngeal Nasopharyngeal Swab     Status: None   Collection Time: 10/30/19 11:32 AM   Specimen: Nasopharyngeal Swab  Result Value Ref Range Status   SARS Coronavirus 2 NEGATIVE NEGATIVE Final    Comment: (NOTE) SARS-CoV-2 target nucleic acids are NOT DETECTED. The SARS-CoV-2 RNA is generally detectable in upper and lower respiratory specimens during the acute phase of infection. Negative results do not preclude SARS-CoV-2 infection, do not rule out co-infections with other pathogens, and should not be used as the sole basis for treatment or other patient management decisions. Negative results must be combined with clinical observations, patient history, and epidemiological information. The expected result is Negative. Fact Sheet for Patients: SugarRoll.be Fact Sheet for Healthcare Providers: https://www.woods-mathews.com/ This test is not yet approved or cleared by the [place] FDA and  has been authorized for detection and/or diagnosis of SARS-CoV-2 by FDA under an Emergency Use Authorization (EUA). This EUA will remain  in effect (meaning this test can be used) for the duration of the COVID-19 declaration under Section 56 4(b)(1) of the  Act, 21 U.S.C. section 360bbb-3(b)(1), unless the authorization is terminated or revoked sooner. Performed at [facility], [place] [address], [place], [place] [address]      Labs: BNP (last 3 results) Recent Labs    08/16/19 2022 09/02/19 1217 09/17/19 1725  BNP 76.9 85.8 335.4*   Basic Metabolic Panel: Recent Labs  Lab 10/25/19 0452 10/26/19 0402 10/27/19 0326 10/28/19 0333 10/29/19 0354 10/30/19 0436  NA 142 142 145 144 144  --   K 4.4 4.0 4.1 4.0 3.8  --   CL 109 110 115* 113* 113*  --   CO2 23 23 25 24 23   --   GLUCOSE 137* 166* 97 140* 111*  --   BUN 12 10 8 10 10   --   CREATININE 1.31* 1.24* 1.20* 1.25* 1.21*  --   CALCIUM 7.0* 6.7* 6.8* 6.6* 6.7*  --   MG  --   --  1.6* 1.9 1.8 2.1   Liver Function Tests: No results for input(s): AST, ALT, ALKPHOS, BILITOT, PROT, ALBUMIN in the last 168 hours. No results for input(s): LIPASE, AMYLASE in the last 168 hours. No results for input(s): AMMONIA in the last 168 hours. CBC: Recent Labs  Lab 10/25/19 0452 10/26/19 0402 10/27/19 0326 10/28/19 0333 10/29/19 0354  WBC 11.4* 11.2* 11.6* 12.6* 11.5*  HGB 9.0* 8.5* 8.2* 8.3* 8.4*  HCT 25.4* 24.3* 23.1* 23.2* 23.6*  MCV 86.1 87.1 85.2 83.8 84.6  PLT 217 200 181 184 169   Cardiac Enzymes: No results for input(s): CKTOTAL, CKMB, CKMBINDEX, TROPONINI in the last 168 hours. BNP: Invalid input(s): POCBNP CBG: Recent Labs  Lab 10/29/19 1117 10/29/19 1637 10/29/19 2111 10/30/19 0640 10/30/19 1132  GLUCAP 150* 148* 131* 120* 132*   D-Dimer No results for input(s): DDIMER in the last 72 hours. Hgb A1c No results for input(s): HGBA1C in the last 72 hours. Lipid Profile No results for input(s): CHOL, HDL, LDLCALC, TRIG, CHOLHDL, LDLDIRECT in the last 72 hours. Thyroid  function studies No results for input(s): TSH, T4TOTAL, T3FREE, THYROIDAB in the last 72 hours.  Invalid input(s): FREET3 Anemia work up No results for input(s): VITAMINB12, FOLATE,  FERRITIN, TIBC, IRON, RETICCTPCT in the last 72 hours. Urinalysis    Component Value Date/Time   COLORURINE YELLOW 10/22/2019 2200   APPEARANCEUR HAZY (A) 10/22/2019 2200   LABSPEC 1.015 10/22/2019 2200   LABSPEC 1.015 04/06/2015 0939   PHURINE 5.0 10/22/2019 2200   GLUCOSEU NEGATIVE 10/22/2019 2200   HGBUR SMALL (A) 10/22/2019 2200   BILIRUBINUR NEGATIVE 10/22/2019 2200   BILIRUBINUR 1+ 10/01/2018 1146   KETONESUR NEGATIVE 10/22/2019 2200   PROTEINUR NEGATIVE 10/22/2019 2200   UROBILINOGEN 0.2 10/01/2018 1146   UROBILINOGEN 0.2 04/06/2015 0939   NITRITE NEGATIVE 10/22/2019 2200   LEUKOCYTESUR LARGE (A) 10/22/2019 2200   Sepsis Labs Invalid input(s): PROCALCITONIN,  WBC,  LACTICIDVEN Microbiology Recent Results (from the past 240 hour(s))  Urine culture     Status: Abnormal   Collection Time: 10/22/19  8:01 PM   Specimen: Urine, Random  Result Value Ref Range Status   Specimen Description URINE, RANDOM  Final   Special Requests   Final    NONE Performed at [facility], [place] [address], [place], [place] [address]    Culture (A)  Final    >=100,000 COLONIES/mL ESCHERICHIA COLI Confirmed Extended Spectrum Beta-Lactamase Producer (ESBL).  In bloodstream infections from ESBL organisms, carbapenems are preferred over piperacillin/tazobactam. They are shown to have a lower risk of mortality. 40,000 COLONIES/mL VANCOMYCIN RESISTANT ENTEROCOCCUS    Report Status 10/26/2019 FINAL  Final   Organism ID, Bacteria ESCHERICHIA COLI (A)  Final   Organism ID, Bacteria VANCOMYCIN RESISTANT ENTEROCOCCUS (A)  Final      Susceptibility   Escherichia coli - MIC*    AMPICILLIN >=32 RESISTANT Resistant     CEFAZOLIN >=64 RESISTANT Resistant     CEFTRIAXONE >=64 RESISTANT Resistant     CIPROFLOXACIN >=4 RESISTANT Resistant     GENTAMICIN <=1 SENSITIVE Sensitive     IMIPENEM <=0.25 SENSITIVE Sensitive     NITROFURANTOIN <=16 SENSITIVE Sensitive     TRIMETH/SULFA >=320 RESISTANT  Resistant     AMPICILLIN/SULBACTAM 8 SENSITIVE Sensitive     PIP/TAZO <=4 SENSITIVE Sensitive     * >=100,000 COLONIES/mL ESCHERICHIA COLI   Vancomycin resistant enterococcus - MIC*    AMPICILLIN >=32 RESISTANT Resistant     NITROFURANTOIN 64 INTERMEDIATE Intermediate     VANCOMYCIN >=32 RESISTANT Resistant     LINEZOLID 2 SENSITIVE Sensitive     * 40,000 COLONIES/mL VANCOMYCIN RESISTANT ENTEROCOCCUS  SARS CORONAVIRUS 2 (TAT 6-24 HRS) Nasopharyngeal Nasopharyngeal Swab     Status: None   Collection Time: 10/23/19  2:19 AM   Specimen: Nasopharyngeal Swab  Result Value Ref Range Status   SARS Coronavirus 2 NEGATIVE NEGATIVE Final    Comment: (NOTE) SARS-CoV-2 target nucleic acids are NOT DETECTED. The SARS-CoV-2 RNA is generally detectable in upper and lower respiratory specimens during the acute phase of infection. Negative results do not preclude SARS-CoV-2 infection, do not rule out co-infections with other pathogens, and should not be used as the sole basis for treatment or other patient management decisions. Negative results must be combined with clinical observations, patient history, and epidemiological information. The expected result is Negative. Fact Sheet for Patients: SugarRoll.be Fact Sheet for Healthcare Providers: https://www.woods-mathews.com/ This test is not yet approved or cleared by the [place] FDA and  has been authorized for detection and/or diagnosis of SARS-CoV-2  by FDA under an Emergency Use Authorization (EUA). This EUA will remain  in effect (meaning this test can be used) for the duration of the COVID-19 declaration under Section 56 4(b)(1) of the Act, 21 U.S.C. section 360bbb-3(b)(1), unless the authorization is terminated or revoked sooner. Performed at [facility], [place] [address] [place], [place] [address]   MRSA PCR Screening     Status: Abnormal   Collection Time: 10/24/19  1:54 PM    Specimen: Nasopharyngeal  Result Value Ref Range Status   MRSA by PCR POSITIVE (A) NEGATIVE Final    Comment:        The GeneXpert MRSA Assay (FDA approved for NASAL specimens only), is one component of a comprehensive MRSA colonization surveillance program. It is not intended to diagnose MRSA infection nor to guide or monitor treatment for MRSA infections. RESULT CALLED TO, READ BACK BY AND VERIFIED WITH: Garlon Hatchet RN 15:50 10/24/19 ([email or web address]) Performed at [facility], [place] [address], [place], [place] [address]   Culture, blood (Routine X 2) w Reflex to ID Panel     Status: None   Collection Time: 10/24/19  8:19 PM   Specimen: BLOOD  Result Value Ref Range Status   Specimen Description BLOOD RIGHT ANTECUBITAL  Final   Special Requests   Final    BOTTLES DRAWN AEROBIC AND ANAEROBIC Blood Culture adequate volume   Culture   Final    NO GROWTH 5 DAYS Performed at [facility], [place] [address] [place], [place] [address]    Report Status 10/29/2019 FINAL  Final  Culture, blood (single)     Status: None   Collection Time: 10/25/19  7:20 AM   Specimen: BLOOD  Result Value Ref Range Status   Specimen Description BLOOD LEFT ANTECUBITAL  Final   Special Requests   Final    BOTTLES DRAWN AEROBIC AND ANAEROBIC Blood Culture adequate volume   Culture   Final    NO GROWTH 5 DAYS Performed at [facility], [place] [address], [place],  [address]    Report Status 10/30/2019 FINAL  Final  GI pathogen panel by PCR, stool     Status: None   Collection Time: 10/26/19 11:00 AM   Specimen: Stool  Result Value Ref Range Status   Plesiomonas shigelloides NOT DETECTED NOT DETECTED Final   Yersinia enterocolitica NOT DETECTED NOT DETECTED Final   Vibrio NOT DETECTED NOT DETECTED Final   Enteropathogenic E coli NOT DETECTED NOT DETECTED Final   E coli (ETEC) LT/ST NOT DETECTED NOT DETECTED Final   E coli 1700 by PCR Not applicable NOT DETECTED Final   Cryptosporidium by  PCR NOT DETECTED NOT DETECTED Final   Entamoeba histolytica NOT DETECTED NOT DETECTED Final   Adenovirus F 40/41 NOT DETECTED NOT DETECTED Final   Norovirus GI/GII NOT DETECTED NOT DETECTED Final   Sapovirus NOT DETECTED NOT DETECTED Final    Comment: (NOTE) Performed At: [facility] [address] [place], [place] [id number] Rush Farmer MD RF:[id number]    Vibrio cholerae NOT DETECTED NOT DETECTED Final   Campylobacter by PCR NOT DETECTED NOT DETECTED Final   Salmonella by PCR NOT DETECTED NOT DETECTED Final   E coli (STEC) NOT DETECTED NOT DETECTED Final   Enteroaggregative E coli NOT DETECTED NOT DETECTED Final   Shigella by PCR NOT DETECTED NOT DETECTED Final   Cyclospora cayetanensis NOT DETECTED NOT DETECTED Final   Astrovirus NOT DETECTED NOT DETECTED Final  G lamblia by PCR NOT DETECTED NOT DETECTED Final   Rotavirus A by PCR NOT DETECTED NOT DETECTED Final  SARS CORONAVIRUS 2 (TAT 6-24 HRS) Nasopharyngeal Nasopharyngeal Swab     Status: None   Collection Time: 10/30/19 11:32 AM   Specimen: Nasopharyngeal Swab  Result Value Ref Range Status   SARS Coronavirus 2 NEGATIVE NEGATIVE Final    Comment: (NOTE) SARS-CoV-2 target nucleic acids are NOT DETECTED. The SARS-CoV-2 RNA is generally detectable in upper and lower respiratory specimens during the acute phase of infection. Negative results do not preclude SARS-CoV-2 infection, do not rule out co-infections with other pathogens, and should not be used as the sole basis for treatment or other patient management decisions. Negative results must be combined with clinical observations, patient history, and epidemiological information. The expected result is Negative. Fact Sheet for Patients: SugarRoll.be Fact Sheet for Healthcare Providers: https://www.woods-mathews.com/ This test is not yet approved or cleared by the [place] FDA and  has been authorized for detection  and/or diagnosis of SARS-CoV-2 by FDA under an Emergency Use Authorization (EUA). This EUA will remain  in effect (meaning this test can be used) for the duration of the COVID-19 declaration under Section 56 4(b)(1) of the Act, 21 U.S.C. section 360bbb-3(b)(1), unless the authorization is terminated or revoked sooner. Performed at [facility], [place] [address] [place], [place] [address]      Time coordinating discharge:  35 minutes  SIGNED:   Barb Merino, MD  [facility] 10/30/2019, 4:26 PM

## 2019-10-30 NOTE — Evaluation (Addendum)
Occupational Therapy Evaluation Patient Details Name: [name] MRN: [id number] DOB: 1950/10/08 Today's Date: 10/30/2019    History of Present Illness 69 yo female with onset of abd pain on 3/30 and colitis, UTI, was admitted for symptoms.  Pt was hypoxic on 2L at 79%, feeling weak.  Has PNA or post Covid changes in lungs, had recent admission for UTI septic shock in early March 2021.  Has acute metabolic encephalopathy, gastritis, PMHx:  Covid 2, UTI, AKI, CKD, dehydration, falls, hypotension, DM, DVT, PN, mood disorder, blurry vision, anemia,    Clinical Impression   This 69 y/o female presents with the above. Pt's daughter present and supportive during session, reports pt has been in and out of rehab facilities/the hospital multiple times over the last few months and has added to her overall decline in functional status. Today pt tolerating bed mobility with maxA, sitting EOB approx 7-9 min during session at supervision level while taking medications and engaging in LE ROM. Pt currently requiring at least minA for seated UB ADL, totalA for LB ADL due to generalized weakness, fatigue, and decreased mobility status. She will benefit from continued acute OT services and recommend follow up therapies in SNF setting after discharge to maximize her overall safety and independence with ADL and mobility. Will follow.     Follow Up Recommendations  SNF    Equipment Recommendations  Other (comment)(TBD )           Precautions / Restrictions Precautions Precautions: Fall Restrictions Weight Bearing Restrictions: No      Mobility Bed Mobility Overal bed mobility: Needs Assistance Bed Mobility: Supine to Sit;Sit to Supine     Supine to sit: Max assist Sit to supine: Max assist   General bed mobility comments: assist for LEs and trunk elevation; cues for technique/sequencing  Transfers                 General transfer comment: deferred, will require +2 assist     Balance  Overall balance assessment: Needs assistance Sitting-balance support: Feet supported;Bilateral upper extremity supported Sitting balance-Leahy Scale: Fair                                     ADL either performed or assessed with clinical judgement   ADL Overall ADL's : Needs assistance/impaired Eating/Feeding: Set up;Sitting;Bed level Eating/Feeding Details (indicate cue type and reason): pt eating watermelon upon arrival, noted requiring increased time/effort to do so  Grooming: Min guard;Set up;Sitting   Upper Body Bathing: Minimal assistance;Sitting   Lower Body Bathing: Moderate assistance;Total assistance;Sitting/lateral leans;Bed level   Upper Body Dressing : Minimal assistance;Sitting   Lower Body Dressing: Total assistance;Sitting/lateral leans;Bed level       Toileting- Clothing Manipulation and Hygiene: Total assistance;Bed level         General ADL Comments: pt presenting with fatigue, overall weakness, decreased mobility status                  Pertinent Vitals/Pain Pain Assessment: Faces Faces Pain Scale: Hurts little more Pain Location: bil LEs, back Pain Descriptors / Indicators: Discomfort;Sore Pain Intervention(s): Limited activity within patient's tolerance;Monitored during session;Repositioned     Hand Dominance Right   Extremity/Trunk Assessment Upper Extremity Assessment Upper Extremity Assessment: Generalized weakness;LUE deficits/detail LUE Deficits / Details: decreased ROM, reports needing to have surgery LUE Coordination: decreased gross motor   Lower Extremity Assessment Lower Extremity Assessment: Defer to  PT evaluation   Cervical / Trunk Assessment Cervical / Trunk Assessment: Kyphotic   Communication Communication Communication: No difficulties   Cognition Arousal/Alertness: Awake/alert Behavior During Therapy: Flat affect Overall Cognitive Status: Impaired/Different from baseline Area of Impairment: Problem  solving;Awareness;Attention;Memory                   Current Attention Level: Selective Memory: Decreased recall of precautions;Decreased short-term memory     Awareness: Intellectual Problem Solving: Slow processing;Requires verbal cues General Comments: daughter present this session and also endorsing that pt has been more confused and down lately   General Comments       Exercises Exercises: General Lower Extremity General Exercises - Lower Extremity Ankle Circles/Pumps: AROM;Both;5 reps Long Arc Quad: AROM;Both;10 reps   Shoulder Instructions      Home Living Family/patient expects to be discharged to:: Private residence Living Arrangements: Alone   Type of Home: Apartment Home Access: Stairs to enter Entrance Stairs-Number of Steps: 1 Entrance Stairs-Rails: Right Home Layout: One level         Bathroom Toilet: [place]: Walker - 4 wheels;Shower seat;Walker - 2 wheels   Additional Comments: family to assist her but not there around the clock; daughter reports pt in and out of rehab facilities/hospital over past few months      Prior Functioning/Environment Level of Independence: Independent with assistive device(s)        Comments: using RW vs SPC but mainly RW        OT Problem List: Decreased strength;Decreased range of motion;Decreased activity tolerance;Impaired balance (sitting and/or standing);Decreased cognition;Decreased safety awareness;Decreased knowledge of use of DME or AE;Obesity;Pain;Impaired UE functional use      OT Treatment/Interventions: Self-care/ADL training;Therapeutic exercise;Energy conservation;DME and/or AE instruction;Therapeutic activities;Patient/family education;Balance training;Cognitive remediation/compensation    OT Goals(Current goals can be found in the care plan section) Acute Rehab OT Goals Patient Stated Goal: to get home to family OT Goal Formulation: With patient Time For Goal  Achievement: 11/13/19 Potential to Achieve Goals: Good  OT Frequency: Min 2X/week   Barriers to D/C:            Co-evaluation              AM-PAC OT "6 Clicks" Daily Activity     Outcome Measure Help from another person eating meals?: A Little Help from another person taking care of personal grooming?: A Little Help from another person toileting, which includes using toliet, bedpan, or urinal?: Total Help from another person bathing (including washing, rinsing, drying)?: A Lot Help from another person to put on and taking off regular upper body clothing?: A Lot Help from another person to put on and taking off regular lower body clothing?: Total 6 Click Score: 12   End of Session Nurse Communication: Mobility status  Activity Tolerance: Patient tolerated treatment well;Patient limited by fatigue Patient left: in bed;with call bell/phone within reach;with bed alarm set;with family/visitor present  OT Visit Diagnosis: Muscle weakness (generalized) (M62.81);Other abnormalities of gait and mobility (R26.89);Other symptoms and signs involving cognitive function                Time: 1351-1413 OT Time Calculation (min): 22 min Charges:  OT General Charges $OT Visit: 1 Visit OT Evaluation $OT Eval Moderate Complexity: [place], OT Acute Rehabilitation Services Pager [phone] Office [phone]   Raymondo Band 10/30/2019, 2:30 PM

## 2019-10-30 NOTE — Progress Notes (Signed)
Report given to Janace Hoard RN at [facility], Waiting for [facility] to transport patient.

## 2019-10-30 NOTE — Progress Notes (Signed)
PROGRESS NOTE    [name]  GQB:[id number] DOB: [birth year] DOA: 10/22/2019 PCP: Ann Held, DO    Brief Narrative:  [name] is a 69 y.o. year old female with medical history significant for stomach CA in remission, DM2, hypotension on midodrine, COVID 19 infection with no need for treatment but acute DVT (07/2019 now on Eliquis,  who presented on 10/22/2019 with worsening abdominal pain. She was admitted with working diagnosis of UTI and concern for infectious colitis.  Hospital Course: Initially empirically treated with meropenem for mild diffuse colitis in setting of recent ESBL UTI. On 4/2 ct chest for hypoxia (SpO 279% on 2 L)confirmed ground glass airspace disease that either representing multifocal pneumonia or postinflammatory scarring from recent COVID-19 infection (January 2021).  Patient was started on empiric vancomycin on 4/2 in addition to meropenem given positive MRSA swab and above findings. Given no growth on blood cultures patient was weaned to doxycycline on 4/5 but suspect lung findings on CT chest were sequelae of prior COVID infection.   Notable history for recent hospitalizations:  1/14-1/22/2021 for generalized weakness and recurrent falls after recent hospitalization for Covid infection periumbilical rash, AKI on CKD due to dehydration/poor appetite/nausea and orthostatic hypotension responsive to IV fluids patient was discharged home, declining SNF recommendations by PT/OT  2/23-3/5 for septic shock requiring pressors in ICU from ESBL E. coli UTI and relative adrenal insufficiency and possible aspiration pneumonia requiring pressors.  Palliative discussions were initiated at that time to assist with [place] and symptom management. Family and patient reported they would reach out to palliative when they were ready to continue discussions    Assessment & Plan:   Principal Problem:   Urinary tract infection due to extended-spectrum beta lactamase (ESBL)  producing Escherichia coli Active Problems:   Diabetic polyneuropathy associated with type 2 diabetes mellitus (HCC)   Chronic pain syndrome   CKD (chronic kidney disease) stage 3, GFR 30-59 ml/min (HCC)   Hypotension   Hyperlipidemia associated with type 2 diabetes mellitus ([place])   Colitis   AKI (acute kidney injury) ([place])   History of DVT (deep vein thrombosis)   UTI due to extended-spectrum beta lactamase (ESBL) producing Escherichia coli   Acute metabolic encephalopathy   Hypoxia   Ground glass opacity present on imaging of lung   Acute respiratory failure with hypoxia (HCC)   Decreased oral intake  Acute hypoxic restaurant failure with groundglass airspace disease: [place] of COVID-19 infection.  Currently no evidence of repeat infection.  Empirically treated with meropenem and doxycycline.  Finished 7 days of meropenem.  We will continue doxycycline for total 10 days given colitis on her CT scan.  Blood cultures negative.  Acute UTI present on admission due to ESBL E. coli: Meropenem day 7/7.  Probably colonized.  Acute on chronic hypotension: Patient on midodrine.  Currently stable.  GERD/gastritis with treatment esophageal dysmotility: Recent barium swallow.  EGD on 08/2019 without a stricture.  Bariatric diet.  Frequent eating.  Reglan as needed.  Decreased oral appetite, failure to thrive, moderate protein calorie malnutrition: Due to recurrent hospitalizations and illnesses.  Seen by nutrition.  Increase nutrition.  AKI on CKD stage III: Likely multifactorial.  Treated with IV fluids with improvement.  Hyperkalemia: Resolved.  Type 2 diabetes, A1c 5.6.  With peripheral neuropathy.  No need to treat for diabetes.  On gabapentin.  History of coronary artery disease status post PCI stenting 08/2018: Continue Lopressor.  Recent DVT after COVID-19 infection: On Eliquis.  Treat for 6 months.  Tolerating.  Profound physical deconditioning: Work with PT OT.  She will benefit  with going back to inpatient physical therapy at the skilled nursing facility.  DVT prophylaxis: Eliquis Code Status: Full code Family Communication: None Disposition Plan: patient is from home. Anticipated DC to skilled nursing facility, Barriers to discharge insurance prior authorization, COVID-19 results.   Consultants:   None  Procedures:   None  Antimicrobials:   Meropenem, 3/30-4/6  Doxycycline, 4/4---   Subjective: Patient seen and examined.  No overnight events.  Objective: Vitals:   10/29/19 2126 10/29/19 2156 10/30/19 0600 10/30/19 0844  BP: (!) 86/64 103/67 96/62 93/72   Pulse: 84  84 95  Resp: 16  14 18   Temp: 98.6 F (37 C)  98.7 F (37.1 C) 98.2 F (36.8 C)  TempSrc: Oral  Oral Oral  SpO2: 100%  93% 99%  Weight:  99.3 kg    Height:        Intake/Output Summary (Last 24 hours) at 10/30/2019 1335 Last data filed at 10/30/2019 0900 Gross per 24 hour  Intake 900 ml  Output 0 ml  Net 900 ml   Filed Weights   10/27/19 2140 10/28/19 2105 10/29/19 2156  Weight: 100.8 kg 99.2 kg 99.3 kg    Examination:  General exam: Appears calm and comfortable, chronically sick looking.  Not in any distress.  On room air. Respiratory system: Clear to auscultation. Respiratory effort normal. Cardiovascular system: S1 & S2 heard, RRR. No JVD, murmurs, rubs, gallops or clicks.  Trace pedal edema bilateral.   Gastrointestinal system: Abdomen is nondistended, soft and nontender.  Central nervous system: Alert and oriented. No focal neurological deficits. Extremities: Symmetric 5 x 5 power. Skin: No rashes, lesions or ulcers Psychiatry: Judgement and insight appear normal. Mood & affect flat and anxious.    Data Reviewed: I have personally reviewed following labs and imaging studies  CBC: Recent Labs  Lab 10/25/19 0452 10/26/19 0402 10/27/19 0326 10/28/19 0333 10/29/19 0354  WBC 11.4* 11.2* 11.6* 12.6* 11.5*  HGB 9.0* 8.5* 8.2* 8.3* 8.4*  HCT 25.4* 24.3*  23.1* 23.2* 23.6*  MCV 86.1 87.1 85.2 83.8 84.6  PLT 217 200 181 184 638   Basic Metabolic Panel: Recent Labs  Lab 10/25/19 0452 10/26/19 0402 10/27/19 0326 10/28/19 0333 10/29/19 0354 10/30/19 0436  NA 142 142 145 144 144  --   K 4.4 4.0 4.1 4.0 3.8  --   CL 109 110 115* 113* 113*  --   CO2 23 23 25 24 23   --   GLUCOSE 137* 166* 97 140* 111*  --   BUN 12 10 8 10 10   --   CREATININE 1.31* 1.24* 1.20* 1.25* 1.21*  --   CALCIUM 7.0* 6.7* 6.8* 6.6* 6.7*  --   MG  --   --  1.6* 1.9 1.8 2.1   GFR: Estimated Creatinine Clearance: 53.6 mL/min (A) (by C-G formula based on SCr of 1.21 mg/dL (H)). Liver Function Tests: No results for input(s): AST, ALT, ALKPHOS, BILITOT, PROT, ALBUMIN in the last 168 hours. No results for input(s): LIPASE, AMYLASE in the last 168 hours. No results for input(s): AMMONIA in the last 168 hours. Coagulation Profile: No results for input(s): INR, PROTIME in the last 168 hours. Cardiac Enzymes: No results for input(s): CKTOTAL, CKMB, CKMBINDEX, TROPONINI in the last 168 hours. BNP (last 3 results) No results for input(s): PROBNP in the last 8760 hours. HbA1C: No results for input(s): HGBA1C in the  last 72 hours. CBG: Recent Labs  Lab 10/29/19 1117 10/29/19 1637 10/29/19 2111 10/30/19 0640 10/30/19 1132  GLUCAP 150* 148* 131* 120* 132*   Lipid Profile: No results for input(s): CHOL, HDL, LDLCALC, TRIG, CHOLHDL, LDLDIRECT in the last 72 hours. Thyroid Function Tests: No results for input(s): TSH, T4TOTAL, FREET4, T3FREE, THYROIDAB in the last 72 hours. Anemia Panel: No results for input(s): VITAMINB12, FOLATE, FERRITIN, TIBC, IRON, RETICCTPCT in the last 72 hours. Sepsis Labs: Recent Labs  Lab 10/23/19 1411 10/23/19 1833 10/26/19 0402  PROCALCITON  --   --  1.25  LATICACIDVEN 1.4 1.3  --     Recent Results (from the past 240 hour(s))  Urine culture     Status: Abnormal   Collection Time: 10/22/19  8:01 PM   Specimen: Urine, Random    Result Value Ref Range Status   Specimen Description URINE, RANDOM  Final   Special Requests   Final    NONE Performed at [facility], [address], [place], [place] [address]    Culture (A)  Final    >=100,000 COLONIES/mL ESCHERICHIA COLI Confirmed Extended Spectrum Beta-Lactamase Producer (ESBL).  In bloodstream infections from ESBL organisms, carbapenems are preferred over piperacillin/tazobactam. They are shown to have a lower risk of mortality. 40,000 COLONIES/mL VANCOMYCIN RESISTANT ENTEROCOCCUS    Report Status 10/26/2019 FINAL  Final   Organism ID, Bacteria ESCHERICHIA COLI (A)  Final   Organism ID, Bacteria VANCOMYCIN RESISTANT ENTEROCOCCUS (A)  Final      Susceptibility   Escherichia coli - MIC*    AMPICILLIN >=32 RESISTANT Resistant     CEFAZOLIN >=64 RESISTANT Resistant     CEFTRIAXONE >=64 RESISTANT Resistant     CIPROFLOXACIN >=4 RESISTANT Resistant     GENTAMICIN <=1 SENSITIVE Sensitive     IMIPENEM <=0.25 SENSITIVE Sensitive     NITROFURANTOIN <=16 SENSITIVE Sensitive     TRIMETH/SULFA >=320 RESISTANT Resistant     AMPICILLIN/SULBACTAM 8 SENSITIVE Sensitive     PIP/TAZO <=4 SENSITIVE Sensitive     * >=100,000 COLONIES/mL ESCHERICHIA COLI   Vancomycin resistant enterococcus - MIC*    AMPICILLIN >=32 RESISTANT Resistant     NITROFURANTOIN 64 INTERMEDIATE Intermediate     VANCOMYCIN >=32 RESISTANT Resistant     LINEZOLID 2 SENSITIVE Sensitive     * 40,000 COLONIES/mL VANCOMYCIN RESISTANT ENTEROCOCCUS  SARS CORONAVIRUS 2 (TAT 6-24 HRS) Nasopharyngeal Nasopharyngeal Swab     Status: None   Collection Time: 10/23/19  2:19 AM   Specimen: Nasopharyngeal Swab  Result Value Ref Range Status   SARS Coronavirus 2 NEGATIVE NEGATIVE Final    Comment: (NOTE) SARS-CoV-2 target nucleic acids are NOT DETECTED. The SARS-CoV-2 RNA is generally detectable in upper and lower respiratory specimens during the acute phase of infection. Negative results do not preclude  SARS-CoV-2 infection, do not rule out co-infections with other pathogens, and should not be used as the sole basis for treatment or other patient management decisions. Negative results must be combined with clinical observations, patient history, and epidemiological information. The expected result is Negative. Fact Sheet for Patients: SugarRoll.be Fact Sheet for Healthcare Providers: https://www.woods-mathews.com/ This test is not yet approved or cleared by the [place] FDA and  has been authorized for detection and/or diagnosis of SARS-CoV-2 by FDA under an Emergency Use Authorization (EUA). This EUA will remain  in effect (meaning this test can be used) for the duration of the COVID-19 declaration under Section 56 4(b)(1) of the Act, 21 U.S.C. section 360bbb-3(b)(1),  unless the authorization is terminated or revoked sooner. Performed at [facility], [place] [address] [place], [place] [address]   MRSA PCR Screening     Status: Abnormal   Collection Time: 10/24/19  1:54 PM   Specimen: Nasopharyngeal  Result Value Ref Range Status   MRSA by PCR POSITIVE (A) NEGATIVE Final    Comment:        The GeneXpert MRSA Assay (FDA approved for NASAL specimens only), is one component of a comprehensive MRSA colonization surveillance program. It is not intended to diagnose MRSA infection nor to guide or monitor treatment for MRSA infections. RESULT CALLED TO, READ BACK BY AND VERIFIED WITH: Garlon Hatchet RN 15:50 10/24/19 ([email or web address]) Performed at [facility], [place] [address] [place], [place] [address]   Culture, blood (Routine X 2) w Reflex to ID Panel     Status: None   Collection Time: 10/24/19  8:19 PM   Specimen: BLOOD  Result Value Ref Range Status   Specimen Description BLOOD RIGHT ANTECUBITAL  Final   Special Requests   Final    BOTTLES DRAWN AEROBIC AND ANAEROBIC Blood Culture adequate volume   Culture   Final    NO GROWTH 5  DAYS Performed at [facility], [place] [address], [place], [place] [address]    Report Status 10/29/2019 FINAL  Final  Culture, blood (single)     Status: None   Collection Time: 10/25/19  7:20 AM   Specimen: BLOOD  Result Value Ref Range Status   Specimen Description BLOOD LEFT ANTECUBITAL  Final   Special Requests   Final    BOTTLES DRAWN AEROBIC AND ANAEROBIC Blood Culture adequate volume   Culture   Final    NO GROWTH 5 DAYS Performed at [facility], [place] [address] [place], [place] [address]    Report Status 10/30/2019 FINAL  Final  GI pathogen panel by PCR, stool     Status: None   Collection Time: 10/26/19 11:00 AM   Specimen: Stool  Result Value Ref Range Status   Plesiomonas shigelloides NOT DETECTED NOT DETECTED Final   Yersinia enterocolitica NOT DETECTED NOT DETECTED Final   Vibrio NOT DETECTED NOT DETECTED Final   Enteropathogenic E coli NOT DETECTED NOT DETECTED Final   E coli (ETEC) LT/ST NOT DETECTED NOT DETECTED Final   E coli 4128 by PCR Not applicable NOT DETECTED Final   Cryptosporidium by PCR NOT DETECTED NOT DETECTED Final   Entamoeba histolytica NOT DETECTED NOT DETECTED Final   Adenovirus F 40/41 NOT DETECTED NOT DETECTED Final   Norovirus GI/GII NOT DETECTED NOT DETECTED Final   Sapovirus NOT DETECTED NOT DETECTED Final    Comment: (NOTE) Performed At: [facility] [place], [place] [id number] Rush Farmer MD OB:[id number]    Vibrio cholerae NOT DETECTED NOT DETECTED Final   Campylobacter by PCR NOT DETECTED NOT DETECTED Final   Salmonella by PCR NOT DETECTED NOT DETECTED Final   E coli (STEC) NOT DETECTED NOT DETECTED Final   Enteroaggregative E coli NOT DETECTED NOT DETECTED Final   Shigella by PCR NOT DETECTED NOT DETECTED Final   Cyclospora cayetanensis NOT DETECTED NOT DETECTED Final   Astrovirus NOT DETECTED NOT DETECTED Final   G lamblia by PCR NOT DETECTED NOT DETECTED Final   Rotavirus A by PCR NOT  DETECTED NOT DETECTED Final         Radiology Studies: No results found.      Scheduled  Meds: . antiseptic oral rinse  15 mL Mouth Rinse TID WC  . apixaban  5 mg Oral BID  . Chlorhexidine Gluconate Cloth  6 each Topical Daily  . doxycycline  100 mg Oral Q12H  . famotidine  10 mg Oral Daily  . feeding supplement (ENSURE ENLIVE)  237 mL Oral TID BM  . gabapentin  300 mg Oral Daily   And  . gabapentin  600 mg Oral QHS  . insulin aspart  0-15 Units Subcutaneous TID WC  . insulin aspart  0-5 Units Subcutaneous QHS  . metoprolol tartrate  12.5 mg Oral BID  . midodrine  10 mg Oral TID WC  . multivitamin with minerals  1 tablet Oral Daily  . mupirocin ointment   Nasal BID  . nystatin   Topical TID  . pantoprazole  40 mg Oral Daily  . rosuvastatin  20 mg Oral q1800  . sertraline  50 mg Oral Daily  . sucralfate  1 g Oral TID WC & HS   Continuous Infusions:   LOS: 8 days    Time spent: 25 minutes    Barb Merino, MD [facility] Pager [phone]

## 2019-10-30 NOTE — TOC Transition Note (Signed)
Transition of Care [facility]) - CM/SW Discharge Note   Patient Details  Name: [name] MRN: [id number] Date of Birth: 24-Mar-1951  Transition of Care [facility]) CM/SW Contact:  Bartholomew Crews, RN Phone Number: [phone] 10/30/2019, 4:49 PM   Clinical Narrative:     Insurance authorization received. Covid negative today. [facility] able to accept patient today. MD notified - DC order and summary in place. Daughter notified. PTAR arranged, medical transport paperwork on chart.   Nurse to call report to [phone].   No further TOC needs identified.   Final next level of care: Skilled Nursing Facility Barriers to Discharge: No Barriers Identified   Patient Goals and CMS Choice Patient states their goals for this hospitalization and ongoing recovery are:: patient wants to be able to return home CMS Medicare.gov Compare Post Acute Care list provided to:: Patient Represenative (must comment)(daughter, Alanna) Choice offered to / list presented to : Adult Children  Discharge Placement PASRR number recieved: 10/29/19            Patient [facility] bed at: [facility] Patient to be transferred to facility by: [place] Name of family member notified: Alanna Patient and family notified of of transfer: 10/30/19  Discharge Plan and Services                DME Arranged: N/A DME Agency: NA       HH Arranged: NA [place] Agency: NA        Social Determinants of Health (SDOH) Interventions     Readmission Risk Interventions Readmission Risk Prevention Plan 09/05/2019 09/05/2019 05/16/2019  Transportation Screening Complete - Complete  Medication Review [employer]) Complete - Complete  PCP or Specialist appointment within 3-5 days of discharge Complete - -  HRI or Home Care Consult Patient refused - Complete  SW Recovery Care/Counseling Consult Complete - Complete  Palliative Care Screening Not Applicable Not Applicable Not Applicable  Skilled Nursing Facility Complete Complete  Not Applicable  Some recent data might be hidden

## 2019-10-30 NOTE — TOC Progression Note (Addendum)
Transition of Care [facility]) - Progression Note    Patient Details  Name: [name] MRN: [id number] Date of Birth: 12/04/1950  Transition of Care [facility]) CM/SW Contact  Bartholomew Crews, RN Phone Number: [phone] 10/30/2019, 2:49 PM  Clinical Narrative:    Patient medically stable. Insurance authorization initiated with [employer]. Clinical notes faxed this morning. OT notes faxed this afternoon. Thank you for the evaluation. Spoke with representative at [facility] to discuss patient level of function at SNF prior to decline and readmit to the hospital. Authorization pending. Covid test results pending.   Update: Received message from Dian Situ at [facility] to provide approved for 3 days; authorization for [employer].  Auth# [id number] Ref# [id number] Next review date 4/9 to CM, [place] team following for transition needs.   Expected Discharge Plan: [place] Barriers to Discharge: Continued Medical Work up  Expected Discharge Plan and Services Expected Discharge Plan: [place] arrangements for the past 2 months: Apartment(Patient's home is an apartment; she is currently at a SNF - [facility])                                       Social Determinants of Health (SDOH) Interventions    Readmission Risk Interventions Readmission Risk Prevention Plan 09/05/2019 09/05/2019 05/16/2019  Transportation Screening Complete - Complete  Medication Review Press photographer) Complete - Complete  PCP or Specialist appointment within 3-5 days of discharge Complete - -  HRI or Home Care Consult Patient refused - Complete  SW Recovery Care/Counseling Consult Complete - Complete  Palliative Care Screening Not Applicable Not Applicable Not Applicable  Skilled Nursing Facility Complete Complete Not Applicable  Some recent data might be hidden

## 2019-10-31 DIAGNOSIS — I10 Essential (primary) hypertension: Secondary | ICD-10-CM | POA: Diagnosis not present

## 2019-10-31 DIAGNOSIS — I2510 Atherosclerotic heart disease of native coronary artery without angina pectoris: Secondary | ICD-10-CM | POA: Diagnosis not present

## 2019-10-31 DIAGNOSIS — E1142 Type 2 diabetes mellitus with diabetic polyneuropathy: Secondary | ICD-10-CM | POA: Diagnosis not present

## 2019-10-31 DIAGNOSIS — B948 Sequelae of other specified infectious and parasitic diseases: Secondary | ICD-10-CM | POA: Diagnosis not present

## 2019-11-01 DIAGNOSIS — F332 Major depressive disorder, recurrent severe without psychotic features: Secondary | ICD-10-CM | POA: Diagnosis not present

## 2019-11-01 DIAGNOSIS — R110 Nausea: Secondary | ICD-10-CM | POA: Diagnosis not present

## 2019-11-01 DIAGNOSIS — A09 Infectious gastroenteritis and colitis, unspecified: Secondary | ICD-10-CM | POA: Diagnosis not present

## 2019-11-01 DIAGNOSIS — F411 Generalized anxiety disorder: Secondary | ICD-10-CM | POA: Diagnosis not present

## 2019-11-01 DIAGNOSIS — R5381 Other malaise: Secondary | ICD-10-CM | POA: Diagnosis not present

## 2019-11-01 DIAGNOSIS — Z8616 Personal history of COVID-19: Secondary | ICD-10-CM | POA: Diagnosis not present

## 2019-11-01 DIAGNOSIS — M6281 Muscle weakness (generalized): Secondary | ICD-10-CM | POA: Diagnosis not present

## 2019-11-01 DIAGNOSIS — R627 Adult failure to thrive: Secondary | ICD-10-CM | POA: Diagnosis not present

## 2019-11-03 ENCOUNTER — Emergency Department (HOSPITAL_COMMUNITY): Payer: Medicare HMO

## 2019-11-03 ENCOUNTER — Encounter (HOSPITAL_COMMUNITY): Payer: Self-pay | Admitting: Emergency Medicine

## 2019-11-03 ENCOUNTER — Inpatient Hospital Stay (HOSPITAL_COMMUNITY)
Admission: EM | Admit: 2019-11-03 | Discharge: 2019-11-23 | DRG: 871 | Disposition: E | Payer: Medicare HMO | Source: Skilled Nursing Facility | Attending: Internal Medicine | Admitting: Internal Medicine

## 2019-11-03 DIAGNOSIS — E785 Hyperlipidemia, unspecified: Secondary | ICD-10-CM | POA: Diagnosis present

## 2019-11-03 DIAGNOSIS — Z7901 Long term (current) use of anticoagulants: Secondary | ICD-10-CM

## 2019-11-03 DIAGNOSIS — E872 Acidosis: Secondary | ICD-10-CM | POA: Diagnosis not present

## 2019-11-03 DIAGNOSIS — E440 Moderate protein-calorie malnutrition: Secondary | ICD-10-CM | POA: Diagnosis present

## 2019-11-03 DIAGNOSIS — G629 Polyneuropathy, unspecified: Secondary | ICD-10-CM | POA: Diagnosis present

## 2019-11-03 DIAGNOSIS — E8809 Other disorders of plasma-protein metabolism, not elsewhere classified: Secondary | ICD-10-CM | POA: Diagnosis present

## 2019-11-03 DIAGNOSIS — Z01818 Encounter for other preprocedural examination: Secondary | ICD-10-CM

## 2019-11-03 DIAGNOSIS — I5032 Chronic diastolic (congestive) heart failure: Secondary | ICD-10-CM | POA: Diagnosis present

## 2019-11-03 DIAGNOSIS — R6521 Severe sepsis with septic shock: Secondary | ICD-10-CM | POA: Diagnosis not present

## 2019-11-03 DIAGNOSIS — N170 Acute kidney failure with tubular necrosis: Secondary | ICD-10-CM | POA: Diagnosis not present

## 2019-11-03 DIAGNOSIS — D573 Sickle-cell trait: Secondary | ICD-10-CM | POA: Diagnosis present

## 2019-11-03 DIAGNOSIS — Z452 Encounter for adjustment and management of vascular access device: Secondary | ICD-10-CM

## 2019-11-03 DIAGNOSIS — Z833 Family history of diabetes mellitus: Secondary | ICD-10-CM

## 2019-11-03 DIAGNOSIS — Z885 Allergy status to narcotic agent status: Secondary | ICD-10-CM

## 2019-11-03 DIAGNOSIS — Z87440 Personal history of urinary (tract) infections: Secondary | ICD-10-CM | POA: Diagnosis not present

## 2019-11-03 DIAGNOSIS — Z86718 Personal history of other venous thrombosis and embolism: Secondary | ICD-10-CM

## 2019-11-03 DIAGNOSIS — E874 Mixed disorder of acid-base balance: Secondary | ICD-10-CM | POA: Diagnosis present

## 2019-11-03 DIAGNOSIS — Z87891 Personal history of nicotine dependence: Secondary | ICD-10-CM

## 2019-11-03 DIAGNOSIS — G9341 Metabolic encephalopathy: Secondary | ICD-10-CM | POA: Diagnosis present

## 2019-11-03 DIAGNOSIS — K7290 Hepatic failure, unspecified without coma: Secondary | ICD-10-CM | POA: Diagnosis present

## 2019-11-03 DIAGNOSIS — Z923 Personal history of irradiation: Secondary | ICD-10-CM

## 2019-11-03 DIAGNOSIS — Z0189 Encounter for other specified special examinations: Secondary | ICD-10-CM

## 2019-11-03 DIAGNOSIS — I9589 Other hypotension: Secondary | ICD-10-CM | POA: Diagnosis present

## 2019-11-03 DIAGNOSIS — Z4682 Encounter for fitting and adjustment of non-vascular catheter: Secondary | ICD-10-CM | POA: Diagnosis not present

## 2019-11-03 DIAGNOSIS — Z7189 Other specified counseling: Secondary | ICD-10-CM | POA: Diagnosis not present

## 2019-11-03 DIAGNOSIS — I252 Old myocardial infarction: Secondary | ICD-10-CM

## 2019-11-03 DIAGNOSIS — N183 Chronic kidney disease, stage 3 unspecified: Secondary | ICD-10-CM | POA: Diagnosis present

## 2019-11-03 DIAGNOSIS — R4182 Altered mental status, unspecified: Secondary | ICD-10-CM | POA: Diagnosis not present

## 2019-11-03 DIAGNOSIS — Z9221 Personal history of antineoplastic chemotherapy: Secondary | ICD-10-CM

## 2019-11-03 DIAGNOSIS — I130 Hypertensive heart and chronic kidney disease with heart failure and stage 1 through stage 4 chronic kidney disease, or unspecified chronic kidney disease: Secondary | ICD-10-CM | POA: Diagnosis present

## 2019-11-03 DIAGNOSIS — J9601 Acute respiratory failure with hypoxia: Secondary | ICD-10-CM | POA: Diagnosis not present

## 2019-11-03 DIAGNOSIS — R0602 Shortness of breath: Secondary | ICD-10-CM | POA: Diagnosis not present

## 2019-11-03 DIAGNOSIS — Z7984 Long term (current) use of oral hypoglycemic drugs: Secondary | ICD-10-CM

## 2019-11-03 DIAGNOSIS — Z6836 Body mass index (BMI) 36.0-36.9, adult: Secondary | ICD-10-CM | POA: Diagnosis not present

## 2019-11-03 DIAGNOSIS — R296 Repeated falls: Secondary | ICD-10-CM | POA: Diagnosis present

## 2019-11-03 DIAGNOSIS — N390 Urinary tract infection, site not specified: Secondary | ICD-10-CM | POA: Diagnosis present

## 2019-11-03 DIAGNOSIS — Z515 Encounter for palliative care: Secondary | ICD-10-CM | POA: Diagnosis not present

## 2019-11-03 DIAGNOSIS — A419 Sepsis, unspecified organism: Principal | ICD-10-CM

## 2019-11-03 DIAGNOSIS — K7291 Hepatic failure, unspecified with coma: Secondary | ICD-10-CM | POA: Diagnosis not present

## 2019-11-03 DIAGNOSIS — Z820 Family history of epilepsy and other diseases of the nervous system: Secondary | ICD-10-CM

## 2019-11-03 DIAGNOSIS — I959 Hypotension, unspecified: Secondary | ICD-10-CM | POA: Diagnosis not present

## 2019-11-03 DIAGNOSIS — Z9289 Personal history of other medical treatment: Secondary | ICD-10-CM

## 2019-11-03 DIAGNOSIS — Z66 Do not resuscitate: Secondary | ICD-10-CM | POA: Diagnosis not present

## 2019-11-03 DIAGNOSIS — D6959 Other secondary thrombocytopenia: Secondary | ICD-10-CM | POA: Diagnosis present

## 2019-11-03 DIAGNOSIS — D689 Coagulation defect, unspecified: Secondary | ICD-10-CM | POA: Diagnosis present

## 2019-11-03 DIAGNOSIS — K767 Hepatorenal syndrome: Secondary | ICD-10-CM | POA: Diagnosis present

## 2019-11-03 DIAGNOSIS — D631 Anemia in chronic kidney disease: Secondary | ICD-10-CM | POA: Diagnosis present

## 2019-11-03 DIAGNOSIS — K760 Fatty (change of) liver, not elsewhere classified: Secondary | ICD-10-CM | POA: Diagnosis not present

## 2019-11-03 DIAGNOSIS — Z96649 Presence of unspecified artificial hip joint: Secondary | ICD-10-CM | POA: Diagnosis present

## 2019-11-03 DIAGNOSIS — Z888 Allergy status to other drugs, medicaments and biological substances status: Secondary | ICD-10-CM

## 2019-11-03 DIAGNOSIS — N179 Acute kidney failure, unspecified: Secondary | ICD-10-CM | POA: Diagnosis not present

## 2019-11-03 DIAGNOSIS — J189 Pneumonia, unspecified organism: Secondary | ICD-10-CM | POA: Diagnosis present

## 2019-11-03 DIAGNOSIS — E1122 Type 2 diabetes mellitus with diabetic chronic kidney disease: Secondary | ICD-10-CM | POA: Diagnosis present

## 2019-11-03 DIAGNOSIS — Z96659 Presence of unspecified artificial knee joint: Secondary | ICD-10-CM | POA: Diagnosis present

## 2019-11-03 DIAGNOSIS — Z8616 Personal history of COVID-19: Secondary | ICD-10-CM

## 2019-11-03 DIAGNOSIS — E119 Type 2 diabetes mellitus without complications: Secondary | ICD-10-CM | POA: Diagnosis not present

## 2019-11-03 DIAGNOSIS — Z20822 Contact with and (suspected) exposure to covid-19: Secondary | ICD-10-CM | POA: Diagnosis not present

## 2019-11-03 DIAGNOSIS — R34 Anuria and oliguria: Secondary | ICD-10-CM | POA: Diagnosis present

## 2019-11-03 DIAGNOSIS — R000 Tachycardia, unspecified: Secondary | ICD-10-CM | POA: Diagnosis not present

## 2019-11-03 DIAGNOSIS — G4733 Obstructive sleep apnea (adult) (pediatric): Secondary | ICD-10-CM | POA: Diagnosis present

## 2019-11-03 DIAGNOSIS — R531 Weakness: Secondary | ICD-10-CM | POA: Diagnosis not present

## 2019-11-03 DIAGNOSIS — R404 Transient alteration of awareness: Secondary | ICD-10-CM | POA: Diagnosis not present

## 2019-11-03 DIAGNOSIS — Z8673 Personal history of transient ischemic attack (TIA), and cerebral infarction without residual deficits: Secondary | ICD-10-CM

## 2019-11-03 DIAGNOSIS — R0902 Hypoxemia: Secondary | ICD-10-CM | POA: Diagnosis not present

## 2019-11-03 DIAGNOSIS — R680 Hypothermia, not associated with low environmental temperature: Secondary | ICD-10-CM | POA: Diagnosis present

## 2019-11-03 DIAGNOSIS — K219 Gastro-esophageal reflux disease without esophagitis: Secondary | ICD-10-CM | POA: Diagnosis present

## 2019-11-03 DIAGNOSIS — R918 Other nonspecific abnormal finding of lung field: Secondary | ICD-10-CM | POA: Diagnosis not present

## 2019-11-03 DIAGNOSIS — K7581 Nonalcoholic steatohepatitis (NASH): Secondary | ICD-10-CM | POA: Diagnosis present

## 2019-11-03 DIAGNOSIS — Z85028 Personal history of other malignant neoplasm of stomach: Secondary | ICD-10-CM | POA: Diagnosis not present

## 2019-11-03 DIAGNOSIS — E1165 Type 2 diabetes mellitus with hyperglycemia: Secondary | ICD-10-CM | POA: Diagnosis present

## 2019-11-03 DIAGNOSIS — I361 Nonrheumatic tricuspid (valve) insufficiency: Secondary | ICD-10-CM | POA: Diagnosis not present

## 2019-11-03 DIAGNOSIS — E1140 Type 2 diabetes mellitus with diabetic neuropathy, unspecified: Secondary | ICD-10-CM | POA: Diagnosis present

## 2019-11-03 DIAGNOSIS — Z79899 Other long term (current) drug therapy: Secondary | ICD-10-CM

## 2019-11-03 DIAGNOSIS — K828 Other specified diseases of gallbladder: Secondary | ICD-10-CM | POA: Diagnosis not present

## 2019-11-03 DIAGNOSIS — R627 Adult failure to thrive: Secondary | ICD-10-CM | POA: Diagnosis present

## 2019-11-03 LAB — CBC WITH DIFFERENTIAL/PLATELET
Abs Immature Granulocytes: 0.11 10*3/uL — ABNORMAL HIGH (ref 0.00–0.07)
Abs Immature Granulocytes: 0.15 10*3/uL — ABNORMAL HIGH (ref 0.00–0.07)
Abs Immature Granulocytes: 0.08 10*3/uL — ABNORMAL HIGH (ref 0.00–0.07)
Basophils Absolute: 0 10*3/uL (ref 0.0–0.1)
Basophils Absolute: 0 10*3/uL (ref 0.0–0.1)
Basophils Absolute: 0 10*3/uL (ref 0.0–0.1)
Basophils Relative: 0 %
Basophils Relative: 0 %
Basophils Relative: 0 %
Eosinophils Absolute: 1.4 10*3/uL — ABNORMAL HIGH (ref 0.0–0.5)
Eosinophils Absolute: 0 10*3/uL (ref 0.0–0.5)
Eosinophils Absolute: 0 10*3/uL (ref 0.0–0.5)
Eosinophils Relative: 9 %
Eosinophils Relative: 0 %
Eosinophils Relative: 0 %
HCT: 26.8 % — ABNORMAL LOW (ref 36.0–46.0)
HCT: 29.2 % — ABNORMAL LOW (ref 36.0–46.0)
HCT: 27 % — ABNORMAL LOW (ref 36.0–46.0)
Hemoglobin: 9 g/dL — ABNORMAL LOW (ref 12.0–15.0)
Hemoglobin: 9.9 g/dL — ABNORMAL LOW (ref 12.0–15.0)
Hemoglobin: 8.7 g/dL — ABNORMAL LOW (ref 12.0–15.0)
Immature Granulocytes: 1 %
Immature Granulocytes: 1 %
Immature Granulocytes: 1 %
Lymphocytes Relative: 14 %
Lymphocytes Relative: 10 %
Lymphocytes Relative: 10 %
Lymphs Abs: 2.1 10*3/uL (ref 0.7–4.0)
Lymphs Abs: 1.5 10*3/uL (ref 0.7–4.0)
Lymphs Abs: 1.5 10*3/uL (ref 0.7–4.0)
MCH: 29.9 pg (ref 26.0–34.0)
MCH: 30.1 pg (ref 26.0–34.0)
MCH: 29.5 pg (ref 26.0–34.0)
MCHC: 33.6 g/dL (ref 30.0–36.0)
MCHC: 33.9 g/dL (ref 30.0–36.0)
MCHC: 32.2 g/dL (ref 30.0–36.0)
MCV: 89 fL (ref 80.0–100.0)
MCV: 88.8 fL (ref 80.0–100.0)
MCV: 91.5 fL (ref 80.0–100.0)
Monocytes Absolute: 0.8 10*3/uL (ref 0.1–1.0)
Monocytes Absolute: 0.7 10*3/uL (ref 0.1–1.0)
Monocytes Absolute: 0.7 10*3/uL (ref 0.1–1.0)
Monocytes Relative: 5 %
Monocytes Relative: 5 %
Monocytes Relative: 5 %
Neutro Abs: 10.5 10*3/uL — ABNORMAL HIGH (ref 1.7–7.7)
Neutro Abs: 12.6 10*3/uL — ABNORMAL HIGH (ref 1.7–7.7)
Neutro Abs: 12.6 10*3/uL — ABNORMAL HIGH (ref 1.7–7.7)
Neutrophils Relative %: 71 %
Neutrophils Relative %: 84 %
Neutrophils Relative %: 84 %
Platelets: 122 10*3/uL — ABNORMAL LOW (ref 150–400)
Platelets: UNDETERMINED 10*3/uL (ref 150–400)
Platelets: 116 10*3/uL — ABNORMAL LOW (ref 150–400)
RBC: 3.01 MIL/uL — ABNORMAL LOW (ref 3.87–5.11)
RBC: 3.29 MIL/uL — ABNORMAL LOW (ref 3.87–5.11)
RBC: 2.95 MIL/uL — ABNORMAL LOW (ref 3.87–5.11)
RDW: 17.7 % — ABNORMAL HIGH (ref 11.5–15.5)
RDW: 18 % — ABNORMAL HIGH (ref 11.5–15.5)
RDW: 17.8 % — ABNORMAL HIGH (ref 11.5–15.5)
WBC: 14.9 10*3/uL — ABNORMAL HIGH (ref 4.0–10.5)
WBC: 14.9 10*3/uL — ABNORMAL HIGH (ref 4.0–10.5)
WBC: 15 10*3/uL — ABNORMAL HIGH (ref 4.0–10.5)
nRBC: 0.1 % (ref 0.0–0.2)
nRBC: 0.2 % (ref 0.0–0.2)
nRBC: 0.1 % (ref 0.0–0.2)

## 2019-11-03 LAB — BASIC METABOLIC PANEL
Anion gap: 9 (ref 5–15)
Anion gap: 16 — ABNORMAL HIGH (ref 5–15)
BUN: 17 mg/dL (ref 8–23)
BUN: 17 mg/dL (ref 8–23)
CO2: 19 mmol/L — ABNORMAL LOW (ref 22–32)
CO2: 15 mmol/L — ABNORMAL LOW (ref 22–32)
Calcium: 7.2 mg/dL — ABNORMAL LOW (ref 8.9–10.3)
Calcium: 7.3 mg/dL — ABNORMAL LOW (ref 8.9–10.3)
Chloride: 111 mmol/L (ref 98–111)
Chloride: 106 mmol/L (ref 98–111)
Creatinine, Ser: 2.39 mg/dL — ABNORMAL HIGH (ref 0.44–1.00)
Creatinine, Ser: 2.48 mg/dL — ABNORMAL HIGH (ref 0.44–1.00)
GFR calc Af Amer: 24 mL/min — ABNORMAL LOW (ref >60)
GFR calc Af Amer: 23 mL/min — ABNORMAL LOW (ref >60)
GFR calc non Af Amer: 20 mL/min — ABNORMAL LOW (ref >60)
GFR calc non Af Amer: 19 mL/min — ABNORMAL LOW (ref >60)
Glucose, Bld: 73 mg/dL (ref 70–99)
Glucose, Bld: 107 mg/dL — ABNORMAL HIGH (ref 70–99)
Potassium: 5 mmol/L (ref 3.5–5.1)
Potassium: 4.8 mmol/L (ref 3.5–5.1)
Sodium: 139 mmol/L (ref 135–145)
Sodium: 137 mmol/L (ref 135–145)

## 2019-11-03 LAB — PHOSPHORUS: Phosphorus: 3.8 mg/dL (ref 2.5–4.6)

## 2019-11-03 LAB — URINALYSIS, ROUTINE W REFLEX MICROSCOPIC
Bacteria, UA: NONE SEEN
Bilirubin Urine: NEGATIVE
Glucose, UA: 50 mg/dL — AB
Ketones, ur: NEGATIVE mg/dL
Leukocytes,Ua: NEGATIVE
Nitrite: NEGATIVE
Protein, ur: 30 mg/dL — AB
Specific Gravity, Urine: 1.024 (ref 1.005–1.030)
pH: 5 (ref 5.0–8.0)

## 2019-11-03 LAB — HEPATIC FUNCTION PANEL
ALT: 28 U/L (ref 0–44)
AST: 122 U/L — ABNORMAL HIGH (ref 15–41)
Albumin: 1 g/dL — ABNORMAL LOW (ref 3.5–5.0)
Alkaline Phosphatase: 219 U/L — ABNORMAL HIGH (ref 38–126)
Bilirubin, Direct: 0.6 mg/dL — ABNORMAL HIGH (ref 0.0–0.2)
Indirect Bilirubin: 0.8 mg/dL (ref 0.3–0.9)
Total Bilirubin: 1.4 mg/dL — ABNORMAL HIGH (ref 0.3–1.2)
Total Protein: 4.4 g/dL — ABNORMAL LOW (ref 6.5–8.1)

## 2019-11-03 LAB — COMPREHENSIVE METABOLIC PANEL
ALT: 28 U/L (ref 0–44)
AST: 141 U/L — ABNORMAL HIGH (ref 15–41)
Albumin: 1.6 g/dL — ABNORMAL LOW (ref 3.5–5.0)
Alkaline Phosphatase: 244 U/L — ABNORMAL HIGH (ref 38–126)
Anion gap: 13 (ref 5–15)
BUN: 15 mg/dL (ref 8–23)
CO2: 14 mmol/L — ABNORMAL LOW (ref 22–32)
Calcium: 7.1 mg/dL — ABNORMAL LOW (ref 8.9–10.3)
Chloride: 107 mmol/L (ref 98–111)
Creatinine, Ser: 2.46 mg/dL — ABNORMAL HIGH (ref 0.44–1.00)
GFR calc Af Amer: 23 mL/min — ABNORMAL LOW (ref >60)
GFR calc non Af Amer: 20 mL/min — ABNORMAL LOW (ref >60)
Glucose, Bld: 84 mg/dL (ref 70–99)
Potassium: 6.4 mmol/L (ref 3.5–5.1)
Sodium: 134 mmol/L — ABNORMAL LOW (ref 135–145)
Total Bilirubin: 1.5 mg/dL — ABNORMAL HIGH (ref 0.3–1.2)
Total Protein: 5.4 g/dL — ABNORMAL LOW (ref 6.5–8.1)

## 2019-11-03 LAB — LACTIC ACID, PLASMA
Lactic Acid, Venous: 3.4 mmol/L (ref 0.5–1.9)
Lactic Acid, Venous: 2.8 mmol/L (ref 0.5–1.9)

## 2019-11-03 LAB — PROTIME-INR
INR: 3.3 — ABNORMAL HIGH (ref 0.8–1.2)
Prothrombin Time: 33.5 seconds — ABNORMAL HIGH (ref 11.4–15.2)

## 2019-11-03 LAB — GLUCOSE, CAPILLARY
Glucose-Capillary: 113 mg/dL — ABNORMAL HIGH (ref 70–99)
Glucose-Capillary: 91 mg/dL (ref 70–99)

## 2019-11-03 LAB — BRAIN NATRIURETIC PEPTIDE: B Natriuretic Peptide: 522.1 pg/mL — ABNORMAL HIGH (ref 0.0–100.0)

## 2019-11-03 LAB — MAGNESIUM: Magnesium: 2.2 mg/dL (ref 1.7–2.4)

## 2019-11-03 LAB — PROCALCITONIN: Procalcitonin: 1.4 ng/mL

## 2019-11-03 LAB — LIPASE, BLOOD: Lipase: 11 U/L (ref 11–51)

## 2019-11-03 MED ORDER — ALBUTEROL SULFATE (2.5 MG/3ML) 0.083% IN NEBU: 2.5000 mg | INHALATION_SOLUTION | RESPIRATORY_TRACT | Status: DC | PRN

## 2019-11-03 MED ORDER — CHLORHEXIDINE GLUCONATE CLOTH 2 % EX PADS
6.0000 | MEDICATED_PAD | Freq: Every day | CUTANEOUS | Status: DC
  Administered 2019-11-03 – 2019-11-05 (×3): 6 via TOPICAL

## 2019-11-03 MED ORDER — MIDODRINE HCL 5 MG PO TABS
10.0000 mg | ORAL_TABLET | Freq: Three times a day (TID) | ORAL | Status: DC
  Administered 2019-11-04 – 2019-11-05 (×2): 10 mg via ORAL
  Filled 2019-11-03 (×3): qty 2

## 2019-11-03 MED ORDER — THIAMINE HCL 100 MG/ML IJ SOLN
250.0000 mg | INTRAVENOUS | Status: DC
  Administered 2019-11-03 – 2019-11-04 (×2): 250 mg via INTRAVENOUS
  Filled 2019-11-03 (×3): qty 2.5

## 2019-11-03 MED ORDER — NOREPINEPHRINE 4 MG/250ML-% IV SOLN
0.0000 ug/min | INTRAVENOUS | Status: DC
  Administered 2019-11-03: 5 ug/min via INTRAVENOUS
  Administered 2019-11-03: 14 ug/min via INTRAVENOUS
  Administered 2019-11-04: 16 ug/min via INTRAVENOUS
  Administered 2019-11-04: 18 ug/min via INTRAVENOUS
  Filled 2019-11-03 (×4): qty 250

## 2019-11-03 MED ORDER — CHLORHEXIDINE GLUCONATE 0.12 % MT SOLN
15.0000 mL | Freq: Two times a day (BID) | OROMUCOSAL | Status: DC
  Administered 2019-11-03 – 2019-11-05 (×4): 15 mL via OROMUCOSAL
  Filled 2019-11-03: qty 15

## 2019-11-03 MED ORDER — CLEVIDIPINE BUTYRATE 0.5 MG/ML IV EMUL: 0.0000 mg/h | INTRAVENOUS | Status: DC

## 2019-11-03 MED ORDER — LINEZOLID 600 MG/300ML IV SOLN
600.0000 mg | Freq: Two times a day (BID) | INTRAVENOUS | Status: DC
  Administered 2019-11-03 – 2019-11-05 (×5): 600 mg via INTRAVENOUS
  Filled 2019-11-03 (×6): qty 300

## 2019-11-03 MED ORDER — ORAL CARE MOUTH RINSE
15.0000 mL | Freq: Two times a day (BID) | OROMUCOSAL | Status: DC
  Administered 2019-11-03 – 2019-11-04 (×3): 15 mL via OROMUCOSAL

## 2019-11-03 MED ORDER — ALBUMIN HUMAN 25 % IV SOLN
25.0000 g | Freq: Four times a day (QID) | INTRAVENOUS | Status: AC
  Administered 2019-11-03 – 2019-11-04 (×5): 25 g via INTRAVENOUS
  Filled 2019-11-03 (×8): qty 100

## 2019-11-03 MED ORDER — SODIUM CHLORIDE 0.9 % IV BOLUS
1000.0000 mL | Freq: Once | INTRAVENOUS | Status: AC
  Administered 2019-11-03: 1000 mL via INTRAVENOUS

## 2019-11-03 MED ORDER — SODIUM CHLORIDE 0.9 % IV SOLN
250.0000 mL | INTRAVENOUS | Status: DC
  Administered 2019-11-03: 250 mL via INTRAVENOUS

## 2019-11-03 MED ORDER — SODIUM CHLORIDE 0.9 % IV BOLUS
2000.0000 mL | Freq: Once | INTRAVENOUS | Status: AC
  Administered 2019-11-03: 2000 mL via INTRAVENOUS

## 2019-11-03 MED ORDER — SODIUM CHLORIDE 0.9 % IV SOLN: Freq: Once | INTRAVENOUS | Status: AC

## 2019-11-03 MED ORDER — SODIUM CHLORIDE 0.9 % IV SOLN
1.0000 g | Freq: Once | INTRAVENOUS | Status: AC
  Administered 2019-11-03: 1 g via INTRAVENOUS
  Filled 2019-11-03: qty 1

## 2019-11-03 MED ORDER — DOCUSATE SODIUM 100 MG PO CAPS: 100.0000 mg | ORAL_CAPSULE | Freq: Two times a day (BID) | ORAL | Status: DC | PRN

## 2019-11-03 MED ORDER — SODIUM CHLORIDE 0.9% FLUSH
10.0000 mL | Freq: Two times a day (BID) | INTRAVENOUS | Status: DC
  Administered 2019-11-04 (×2): 10 mL

## 2019-11-03 MED ORDER — SODIUM CHLORIDE 0.9% FLUSH: 10.0000 mL | INTRAVENOUS | Status: DC | PRN

## 2019-11-03 MED ORDER — POLYETHYLENE GLYCOL 3350 17 G PO PACK: 17.0000 g | PACK | Freq: Every day | ORAL | Status: DC | PRN

## 2019-11-03 MED ORDER — DEXTROSE 50 % IV SOLN
12.5000 g | INTRAVENOUS | Status: AC
  Filled 2019-11-03: qty 50

## 2019-11-03 MED ORDER — FAMOTIDINE IN NACL 20-0.9 MG/50ML-% IV SOLN
20.0000 mg | Freq: Every day | INTRAVENOUS | Status: DC
  Administered 2019-11-03 – 2019-11-05 (×3): 20 mg via INTRAVENOUS
  Filled 2019-11-03 (×3): qty 50

## 2019-11-03 NOTE — Progress Notes (Signed)
Patient's daughter arrived to visit patient and commented that her face appeared very swollen. Patient with a known history of medication allergies. ED nurse had just started infusing IV zyvox prior to transferring pt to [place]. Zyvox stopped at this time. Discussed with unit pharmacist and Dr. Valeta Harms - both believe the risk of allergic reaction is low. Zyvox resumed. Will monitor patient closely.  Joellen Jersey, RN

## 2019-11-03 NOTE — Progress Notes (Signed)
eLink Physician-Brief Progress Note Patient Name: [name] DOB: 08-03-1950 MRN: [id number]   Date of Service  11/19/2019  HPI/Events of Note  Notified of K 6.4. Slightly hemolyzed. No EKG changes. Still with altered sensorium.  eICU Interventions  Repeating electrolytes to verify. Hypoglycemia protocol ordered as still NPO        Shona Needles Jenita Rayfield 11/12/2019, 8:28 PM

## 2019-11-03 NOTE — H&P (Signed)
NAME:  [name], MRN:  [id number], DOB:  [birth year], LOS: 0 ADMISSION DATE:  11/12/2019, CONSULTATION DATE: 11/19/2019 REFERRING MD: Dr. Dondra Spry, CHIEF COMPLAINT: Septic shock  Brief History   69 year old female past medical history COVID-19 few months ago multiple hospital admissions for recurrent urinary tract infections in the past, has been at rehabilitation facilities throughout the past several months as well.  History of present illness   69 year old female past medical history of COVID-19 few months ago, recurrent hospital admissions for urinary tract infections, history of GERD, hyperlipidemia, hypertension, morbid obesity, history of , Sleep apnea.  Presents to the ED today with patient's daughter with confusion.  Patient found to have hypotension upon admission to the ER.  Systolic blood pressures in the 70s.  She is on midodrine at baseline for chronic hypotension.  Patient was given 3 L of fluid with no recovery of blood pressure.  She started on norepinephrine to maintain systolic blood pressure in the ED.  Patient has a prior culture results positive for resistant E. coli, vancomycin presents Enterococcus as well as ESBL E. coli.  Patient was started on meropenem.  Past Medical History   Past Medical History:  Diagnosis Date  . Asthma   . Degenerative joint disease   . Diabetes mellitus   . Dyspnea   . GERD (gastroesophageal reflux disease)   . Hyperlipidemia    no longer on medication for this  . Hypertension    patient denies  . Myocardial infarction ([place]) 08/2018  . Neuropathy    bilateral hands and feet  . Sickle cell trait ([place])   . Sleep apnea   . stomach ca dx'd 2010   chemo/xrt comp 05/2009  . TIA (transient ischemic attack) 07/2015     Significant Hospital Events   11/22/2019: ICU admission  Consults:  Pulmonary critical care  Procedures:  None  Significant Diagnostic Tests:  CT Abdpelvis: IMPRESSION: 1. Compared with the prior study from 10  days ago, worsening anasarca with increased edema throughout the soft tissues and increased pelvic ascites. Enlarging moderate left and small right dependent pleural effusions. 2. Underlying patchy ground-glass opacities at both lung bases, likely a combination of atelectasis and inflammation in this patient with history of COVID-19 infection. 3. Nonspecific bowel wall thickening, likely related to anasarca 4. Persistent gallbladder distention without calcified gallstones or focal surrounding inflammation. 5. Diffuse hepatic steatosis. 6. Aortic Atherosclerosis (ICD10-I70.0).  Micro Data:  March 2021: ESBL E. coli, VRE  Antimicrobials:  Meropenem Linezolid  Interim history/subjective:  See HPI above  Objective   Blood pressure (!) 84/56, resp. rate (!) 22.       No intake or output data in the 24 hours ending 10/27/2019 1439 There were no vitals filed for this visit.  Examination: General: Chronically ill elderly obese female resting in bed tachypneic, lethargic HENT: NCAT, sclera clear tracking appropriately, dry mucous membranes Lungs: Diminished in the bases, clear no crackles no wheeze Cardiovascular: Tachycardic, regular, S1-S2 Abdomen: Obese pannus, midline previous abdominal scar, no pain with palpation Extremities: Significant third spaced edema dependent edema in the flanks and lower extremities Neuro: Alert to voice, follows basic commands moves all 4 extremities GU: Deferred  Resolved Hospital Problem list     Assessment & Plan:   Septic shock Urinary tract infection, presumed ESBL E. coli history, vancomycin resistant Enterococcus history Lactic acidosis secondary to above -Continue fluid resuscitation, patient has received 30 cc/kg -If remains continued hypotension will start norepinephrine to maintain mean arterial pressure  in 65 mmHg. -Patient is chronically debilitated will give IV thiamine to help with lactate clearance -Continue meropenem -Start  linezolid for history of VRE -Repeat blood and urine cultures, pending  Acute renal failure -Secondary to sepsis syndrome as above -Continue to observe urine output and avoid nephrotoxic agents  Anasarca Bilateral extremity edema Chronic diastolic heart failure Mildly elevated pulmonary artery systolic pressure on previous echocardiogram Hypoalbuminemia -Repeat echocardiogram pending -Hold additional volume at this time, start pressors -Expect will need diuresis  Coagulopathy On apixaban PTA History of DVT -Continue to observe INR -No intervention at this time, no evidence of bleeding  Anemia of chronic disease -Patient has had multiple CBC draws in the past with evidence of a chronic normocytic anemia.  Low platelets, thrombocytopenia -Like related to sepsis syndrome as above, continue to observe  Morbidly obese   Best practice:  Diet: soft Pain/Anxiety/Delirium protocol (if indicated): none  VAP protocol (if indicated): na DVT prophylaxis: heparin  GI prophylaxis: H2B Glucose control: SSI Mobility: bed Code Status: FULL Family Communication: discssed with daughter Disposition: ICU   Labs   CBC: Recent Labs  Lab 10/28/19 0333 10/29/19 0354 11/21/2019 1208  WBC 12.6* 11.5* 14.9*  NEUTROABS  --   --  10.5*  HGB 8.3* 8.4* 9.0*  HCT 23.2* 23.6* 26.8*  MCV 83.8 84.6 89.0  PLT 184 169 122*    Basic Metabolic Panel: Recent Labs  Lab 10/28/19 0333 10/29/19 0354 10/30/19 0436 10/30/2019 1208  NA 144 144  --  139  K 4.0 3.8  --  5.0  CL 113* 113*  --  111  CO2 24 23  --  19*  GLUCOSE 140* 111*  --  73  BUN 10 10  --  17  CREATININE 1.25* 1.21*  --  2.39*  CALCIUM 6.6* 6.7*  --  7.2*  MG 1.9 1.8 2.1  --    GFR: Estimated Creatinine Clearance: 27.2 mL/min (A) (by C-G formula based on SCr of 2.39 mg/dL (H)). Recent Labs  Lab 10/28/19 0333 10/29/19 0354 11/09/2019 1208  WBC 12.6* 11.5* 14.9*  LATICACIDVEN  --   --  3.4*    Liver Function  Tests: Recent Labs  Lab 10/31/2019 1208  AST 122*  ALT 28  ALKPHOS 219*  BILITOT 1.4*  PROT 4.4*  ALBUMIN 1.0*   Recent Labs  Lab 11/02/2019 1208  LIPASE 11   No results for input(s): AMMONIA in the last 168 hours.  ABG    Component Value Date/Time   PHART 7.402 (H) 01/19/2009 1611   PCO2ART 42.4 01/19/2009 1611   PO2ART 91.0 01/19/2009 1611   HCO3 25.8 (H) 01/19/2009 1611   TCO2 28 12/17/2016 1539   O2SAT 97.1 01/19/2009 1611     Coagulation Profile: Recent Labs  Lab 11/04/2019 1208  INR 3.3*    Cardiac Enzymes: No results for input(s): CKTOTAL, CKMB, CKMBINDEX, TROPONINI in the last 168 hours.  HbA1C: Hemoglobin A1c  Date/Time Value Ref Range Status  06/27/2016 11:43 AM 7.1 (H) 4.8 - 5.6 % Final    Comment:             Pre-diabetes: 5.7 - 6.4          Diabetes: >6.4          Glycemic control for adults with diabetes: <7.0    Hgb A1c MFr Bld  Date/Time Value Ref Range Status  09/27/2019 08:10 AM 5.6 4.8 - 5.6 % Final    Comment:    (NOTE) Pre diabetes:  5.7%-6.4% Diabetes:              >6.4% Glycemic control for   <7.0% adults with diabetes   07/27/2019 02:40 AM 8.7 (H) 4.8 - 5.6 % Final    Comment:    (NOTE) Pre diabetes:          5.7%-6.4% Diabetes:              >6.4% Glycemic control for   <7.0% adults with diabetes     CBG: Recent Labs  Lab 10/29/19 1637 10/29/19 2111 10/30/19 0640 10/30/19 1132 10/30/19 1646  GLUCAP 148* 131* 120* 132* 204*    Review of Systems:   Unable to be completed hypotensive, confused in shock   Past Medical History  She,  has a past medical history of Asthma, Degenerative joint disease, Diabetes mellitus, Dyspnea, GERD (gastroesophageal reflux disease), Hyperlipidemia, Hypertension, Myocardial infarction ([place]) (08/2018), Neuropathy, Sickle cell trait ([place]), Sleep apnea, stomach ca (dx'd 2010), and TIA (transient ischemic attack) (07/2015).   Surgical History    Past Surgical History:  Procedure  Laterality Date  . APPENDECTOMY    . CESAREAN SECTION    . COLON SURGERY     colonscopy  . COLONOSCOPY WITH PROPOFOL N/A 06/01/2016   Procedure: COLONOSCOPY WITH PROPOFOL;  Surgeon: Wilford Corner, MD;  Location: [facility] ENDOSCOPY;  Service: Endoscopy;  Laterality: N/A;  . CORONARY STENT INTERVENTION N/A 08/29/2018   Procedure: CORONARY STENT INTERVENTION;  Surgeon: Wellington Hampshire, MD;  Location: [place] CV LAB;  Service: Cardiovascular;  Laterality: N/A;  . CORONARY/GRAFT ACUTE MI REVASCULARIZATION N/A 08/27/2018   Procedure: Coronary/Graft Acute MI Revascularization;  Surgeon: Leonie Man, MD;  Location: [facility] CV LAB;  Service: Cardiovascular;  Laterality: N/A;  . ESOPHAGOGASTRODUODENOSCOPY N/A 10/15/2012   Procedure: ESOPHAGOGASTRODUODENOSCOPY (EGD);  Surgeon: Lear Ng, MD;  Location: [facility] ENDOSCOPY;  Service: Endoscopy;  Laterality: N/A;  . ESOPHAGOGASTRODUODENOSCOPY N/A 01/15/2016   Procedure: ESOPHAGOGASTRODUODENOSCOPY (EGD);  Surgeon: Ronald Lobo, MD;  Location: [facility] ENDOSCOPY;  Service: Endoscopy;  Laterality: N/A;  . ESOPHAGOGASTRODUODENOSCOPY (EGD) WITH PROPOFOL N/A 09/04/2019   Procedure: ESOPHAGOGASTRODUODENOSCOPY (EGD) WITH PROPOFOL;  Surgeon: Ronald Lobo, MD;  Location: [place];  Service: Endoscopy;  Laterality: N/A;  possible esophageal dilatation  . HERNIA REPAIR    . LEFT HEART CATH AND CORONARY ANGIOGRAPHY N/A 08/27/2018   Procedure: LEFT HEART CATH AND CORONARY ANGIOGRAPHY;  Surgeon: Leonie Man, MD;  Location: [place] CV LAB;  Service: Cardiovascular;  Laterality: N/A;  . SHOULDER SURGERY     Left  . TOTAL HIP ARTHROPLASTY     Right  . TOTAL KNEE ARTHROPLASTY     Left     Social History   reports that she quit smoking about 31 years ago. Her smoking use included cigarettes. She started smoking about 46 years ago. She has a 15.00 pack-year smoking history. She has never used smokeless tobacco. She reports that she does not drink alcohol  or use drugs.   Family History   Her family history includes Alzheimer's disease (age of onset: 42) in her father; Cancer in her maternal grandfather; Cancer (age of onset: 67) in her mother; Diabetes in her brother and maternal grandmother; Healthy in her child. There is no history of Suicidality, Depression, Dementia, or Anxiety disorder.   Allergies Allergies  Allergen Reactions  . Cephalexin Itching, Rash and Other (See Comments)    Sensation of throat closing. Tolerated pip/tazo, cefepime, ceftriaxone before   . Adhesive [Tape] Other (See Comments)  Burn Skin  . Codeine Other (See Comments)    paranoid  . Zocor [Simvastatin - High Dose] Other (See Comments)    Muscle spasms     Home Medications  Prior to Admission medications   Medication Sig Start Date End Date Taking? Authorizing Provider  acetaminophen (TYLENOL) 500 MG tablet Take 2 tablets (1,000 mg total) by mouth every 8 (eight) hours. Patient taking differently: Take 1,000 mg by mouth every 8 (eight) hours as needed for moderate pain.  09/27/19 11/26/19  Mercy Riding, MD  albuterol (PROVENTIL HFA;VENTOLIN HFA) 108 (90 Base) MCG/ACT inhaler Inhale 2 puffs into the lungs every 6 (six) hours as needed for wheezing or shortness of breath. 11/05/17   Roxan Hockey, MD  albuterol (PROVENTIL) (2.5 MG/3ML) 0.083% nebulizer solution Take 3 mLs (2.5 mg total) by nebulization every 6 (six) hours as needed for wheezing or shortness of breath. 11/05/17   Roxan Hockey, MD  apixaban (ELIQUIS) 5 MG TABS tablet Take 1 tablet (5 mg total) by mouth 2 (two) times daily. 08/19/19   Bonnielee Haff, MD  gabapentin (NEURONTIN) 300 MG capsule Take 300 mg by mouth See admin instructions. 300 mg in the morning, 600 mg at bedtime 09/27/19   [provider]  glucose blood (ONETOUCH VERIO) test strip Use as instructed to check blood sugar 3 times a day Patient taking differently: 1 each by Other route See admin instructions. Use as instructed to  check blood sugar 3 times a day 09/04/18   Philemon Kingdom, MD  lidocaine-prilocaine (EMLA) cream Apply 1 application topically as needed. Patient taking differently: Apply 1 application topically as needed (to numb port site.).  02/28/19   Volanda Napoleon, MD  metFORMIN (GLUCOPHAGE) 500 MG tablet Take 1 tablet (500 mg total) by mouth 2 (two) times daily with a meal. 02/13/19   Philemon Kingdom, MD  metoCLOPramide (REGLAN) 5 MG tablet Take 1 tablet (5 mg total) by mouth every 8 (eight) hours as needed for nausea (before meals as needed for nausea.). 08/23/19   Arrien, Jimmy Picket, MD  metoprolol tartrate (LOPRESSOR) 25 MG tablet Take 0.5 tablets (12.5 mg total) by mouth 2 (two) times daily. 08/14/19   Swayze, Ava, DO  midodrine (PROAMATINE) 10 MG tablet Take 1 tablet (10 mg total) by mouth 3 (three) times daily with meals. 09/27/19   Mercy Riding, MD  Multiple Vitamins-Minerals (CENTRUM SILVER ADULT 50+) TABS Take 1 tablet once a day Patient taking differently: Take 1 tablet by mouth daily. Take 1 tablet once a day 02/20/19   Carollee Herter, Alferd Apa, DO  nitroGLYCERIN (NITROSTAT) 0.4 MG SL tablet Place 1 tablet (0.4 mg total) under the tongue every 5 (five) minutes as needed. Patient taking differently: Place 0.4 mg under the tongue every 5 (five) minutes as needed for chest pain.  01/14/19   Lorretta Harp, MD  nystatin (MYCOSTATIN/NYSTOP) powder Apply topically 3 (three) times daily. 08/14/19   Swayze, Ava, DO  ondansetron (ZOFRAN) 4 MG tablet Take 1 tablet (4 mg total) by mouth every 6 (six) hours as needed for nausea. 09/27/19   Mercy Riding, MD  oxybutynin (DITROPAN-XL) 5 MG 24 hr tablet Take 5 mg by mouth daily.    [provider]  pantoprazole (PROTONIX) 40 MG tablet Take 1 tablet (40 mg total) by mouth daily. 07/16/19   Ann Held, DO  rosuvastatin (CRESTOR) 20 MG tablet Take 1 tablet (20 mg total) by mouth daily at 6 PM. 08/14/19  Swayze, Ava, DO  sertraline (ZOLOFT) 50 MG  tablet Take 1 tablet (50 mg total) by mouth daily. 02/20/19   Ann Held, DO  traZODone (DESYREL) 50 MG tablet Take 0.5 tablets (25 mg total) by mouth at bedtime as needed for sleep. 08/14/19   Swayze, Ava, DO  triamcinolone ointment (KENALOG) 0.1 % Apply 1 application topically 2 (two) times daily. For 14 days beginning 10/08/2019    [provider]     Critical care time: 55    This patient is critically ill with multiple organ system failure; which, requires frequent high complexity decision making, assessment, support, evaluation, and titration of therapies. This was completed through the application of advanced monitoring technologies and extensive interpretation of multiple databases. During this encounter critical care time was devoted to patient care services described in this note for 38 minutes.   Garner Nash, DO [facility] 11/20/2019 3:07 PM

## 2019-11-03 NOTE — ED Triage Notes (Signed)
Pt here from [employer] with c/o aloc times two days , pt has hx of UTI's

## 2019-11-03 NOTE — Progress Notes (Signed)
Notified bedside nurse of need to draw repeat lactic acid. 

## 2019-11-03 NOTE — Progress Notes (Signed)
eLink Physician-Brief Progress Note Patient Name: [name] DOB: 26-Oct-1950 MRN: [id number]   Date of Service  10/26/2019  HPI/Events of Note  Repeat K 4.8. Also notified of need for foley cath due to perineal wounds and had required in and out cath earlier with output of 400 cc. Temp 93.5  eICU Interventions  Foley catheter ordered Warming blanket ordered     Intervention Category Major Interventions: Electrolyte abnormality - evaluation and management Intermediate Interventions: Oliguria - evaluation and management  Judd Lien 11/16/2019, 11:02 PM

## 2019-11-03 NOTE — ED Provider Notes (Signed)
[facility] EMERGENCY DEPARTMENT Provider Note   CSN: [id number] Arrival date & time: 11/09/2019  1133     History Chief Complaint  Patient presents with  . Altered Mental Status    [name] is a 69 y.o. female.  The history is provided by the patient.  Altered Mental Status Presenting symptoms: lethargy   Severity:  Moderate Most recent episode:  Today Episode history:  Multiple Timing:  Constant Chronicity:  Recurrent Context: nursing home resident, recent illness and recent infection (Multiple UTIs recently, bed bound, COVID last year. History now of low blood pressure, on midodrine)   Associated symptoms: nausea, rash and weakness   Associated symptoms: no abdominal pain, no fever, no headaches, no light-headedness, no palpitations, no seizures and no vomiting        Past Medical History:  Diagnosis Date  . Asthma   . Degenerative joint disease   . Diabetes mellitus   . Dyspnea   . GERD (gastroesophageal reflux disease)   . Hyperlipidemia    no longer on medication for this  . Hypertension    patient denies  . Myocardial infarction ([place]) 08/2018  . Neuropathy    bilateral hands and feet  . Sickle cell trait ([place])   . Sleep apnea   . stomach ca dx'd 2010   chemo/xrt comp 05/2009  . TIA (transient ischemic attack) 07/2015    Patient Active Problem List   Diagnosis Date Noted  . Decreased oral intake 10/27/2019  . Ground glass opacity present on imaging of lung 10/26/2019  . Acute respiratory failure with hypoxia ([place]) 10/26/2019  . Hypoxia 10/25/2019  . Acute metabolic encephalopathy 40/98/1191  . UTI due to extended-spectrum beta lactamase (ESBL) producing Escherichia coli 10/22/2019  . Goals of care, counseling/discussion   . Palliative care by specialist   . DNR (do not resuscitate) discussion   . Pressure injury of skin 09/18/2019  . Elevated lactic acid level   . Sepsis due to gram-negative UTI ([place]) 09/04/2019  . Urinary  tract infection due to extended-spectrum beta lactamase (ESBL) producing Escherichia coli 09/04/2019  . Acute lower UTI 09/02/2019  . History of DVT (deep vein thrombosis) 09/02/2019  . Drug-induced polyneuropathy ([place]) 08/18/2019  . FTT (failure to thrive) in adult 08/17/2019  . AKI (acute kidney injury) ([place]) 08/17/2019  . SOB (shortness of breath) 08/16/2019  . Lower extremity edema 08/16/2019  . Fall 08/08/2019  . Hypertension 08/08/2019  . COVID-19 virus infection 07/25/2019  . Elevated LFTs 07/25/2019  . COVID-19 07/25/2019  . Allergic contact dermatitis due to adhesives 05/23/2019  . Insomnia 05/23/2019  . SIRS (systemic inflammatory response syndrome) ([place]) 05/14/2019  . NSTEMI (non-ST elevated myocardial infarction) ([place]) 05/14/2019  . Hyperglycemia   . Neutropenia ([place])   . CAD (coronary artery disease)   . Obesity with serious comorbidity 04/16/2019  . Colitis 03/27/2019  . Falls frequently 03/27/2019  . Acute gastroenteritis 03/22/2019  . Hematoma of scalp 03/14/2019  . Vitamin D deficiency 11/09/2018  . Muscle spasm 10/19/2018  . Chest pain 09/07/2018  . Palpitations 09/07/2018  . Other fatigue 09/07/2018  . Acute ST elevation myocardial infarction (STEMI) of inferior wall ([place]) 08/27/2018    Class: Hospitalized for  . Atherosclerotic heart disease of native coronary artery with other forms of angina pectoris ([place]) 08/27/2018    Class: Diagnosis of  . Acute vaginitis 08/20/2018  . Hyperlipidemia associated with type 2 diabetes mellitus ([place]) 04/09/2018  . Bronchitis 11/02/2017  . Sepsis ([place])  11/02/2017  . Leucocytosis 11/02/2017  . Moderate persistent asthma with acute exacerbation 05/26/2017  . Influenza A 05/26/2017  . Acute pain of right shoulder 03/07/2017  . Acute bilateral low back pain without sciatica 03/07/2017  . Chronic pain 03/07/2017  . Fatty liver 03/24/2016  . Weakness 03/23/2016  . Unexplained weight loss 03/23/2016  . Melena 03/23/2016  .  Hypotension 02/03/2016  . Syncope 02/03/2016  . Hypomagnesemia 02/03/2016  . Acute renal failure superimposed on stage 3 chronic kidney disease ([place]) 02/03/2016  . Gastrointestinal dysmotility 01/20/2016  . CKD (chronic kidney disease) stage 3, GFR 30-59 ml/min (HCC) 01/20/2016  . Abscess of right thigh 01/20/2016  . Chronic pain syndrome 01/15/2016  . Diarrhea 01/15/2016  . Generalized abdominal pain 01/14/2016  . Type 2 diabetes with nephropathy ([place]) 12/30/2015  . TIA (transient ischemic attack) 07/2015  . Morbid obesity due to excess calories ([place]) 05/05/2015  . Diabetic polyneuropathy associated with type 2 diabetes mellitus ([place]) 01/02/2015  . Acute bacterial sinusitis 01/02/2015  . Onychomycosis 11/04/2014  . Pain in lower limb 11/04/2014  . Multinodular goiter (nontoxic) 10/16/2014  . Iron deficiency anemia 05/20/2014  . Major depressive disorder, single episode, moderate ([place]) 05/20/2014  . Dizziness of unknown cause 04/28/2014  . MDD (major depressive disorder), recurrent episode, moderate ([place]) 01/28/2014  . GERD (gastroesophageal reflux disease) 01/02/2014  . Nausea 10/15/2012  . Stomach cancer ([place]) 12/10/2008  . CHRONIC RESPIRATORY FAILURE 01/03/2008  . Hyperlipidemia 06/27/2007  . OBESITY, MORBID 06/27/2007  . SLEEP APNEA, OBSTRUCTIVE 06/27/2007  . Generalized idiopathic epilepsy and epileptic syndromes, not intractable, with status epilepticus ([place]) 06/27/2007  . Asthma 06/27/2007  . OSTEOARTHRITIS  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 06/27/2007    Past Surgical History:  Procedure Laterality Date  . APPENDECTOMY    . CESAREAN SECTION    . COLON SURGERY     colonscopy  . COLONOSCOPY WITH PROPOFOL N/A 06/01/2016   Procedure: COLONOSCOPY WITH PROPOFOL;  Surgeon: Wilford Corner, MD;  Location: [facility] ENDOSCOPY;  Service: Endoscopy;  Laterality: N/A;  . CORONARY STENT INTERVENTION N/A 08/29/2018   Procedure: CORONARY STENT INTERVENTION;  Surgeon: Wellington Hampshire, MD;   Location: [facility] CV LAB;  Service: Cardiovascular;  Laterality: N/A;  . CORONARY/GRAFT ACUTE MI REVASCULARIZATION N/A 08/27/2018   Procedure: Coronary/Graft Acute MI Revascularization;  Surgeon: Leonie Man, MD;  Location: [facility] CV LAB;  Service: Cardiovascular;  Laterality: N/A;  . ESOPHAGOGASTRODUODENOSCOPY N/A 10/15/2012   Procedure: ESOPHAGOGASTRODUODENOSCOPY (EGD);  Surgeon: Lear Ng, MD;  Location: [facility] ENDOSCOPY;  Service: Endoscopy;  Laterality: N/A;  . ESOPHAGOGASTRODUODENOSCOPY N/A 01/15/2016   Procedure: ESOPHAGOGASTRODUODENOSCOPY (EGD);  Surgeon: Ronald Lobo, MD;  Location: [facility] ENDOSCOPY;  Service: Endoscopy;  Laterality: N/A;  . ESOPHAGOGASTRODUODENOSCOPY (EGD) WITH PROPOFOL N/A 09/04/2019   Procedure: ESOPHAGOGASTRODUODENOSCOPY (EGD) WITH PROPOFOL;  Surgeon: Ronald Lobo, MD;  Location: [facility];  Service: Endoscopy;  Laterality: N/A;  possible esophageal dilatation  . HERNIA REPAIR    . LEFT HEART CATH AND CORONARY ANGIOGRAPHY N/A 08/27/2018   Procedure: LEFT HEART CATH AND CORONARY ANGIOGRAPHY;  Surgeon: Leonie Man, MD;  Location: [facility] CV LAB;  Service: Cardiovascular;  Laterality: N/A;  . SHOULDER SURGERY     Left  . TOTAL HIP ARTHROPLASTY     Right  . TOTAL KNEE ARTHROPLASTY     Left     OB History    Gravida  4   Para  4   Term      Preterm  AB      Living  4     SAB      TAB      Ectopic      Multiple      Live Births              Family History  Problem Relation Age of Onset  . Diabetes Brother   . Alzheimer's disease Father 20  . Cancer Mother 34  . Diabetes Maternal Grandmother   . Cancer Maternal Grandfather        lung  . Healthy Child   . Suicidality Neg Hx   . Depression Neg Hx   . Dementia Neg Hx   . Anxiety disorder Neg Hx     Social History   Tobacco Use  . Smoking status: Former Smoker    Packs/day: 1.00    Years: 15.00    Pack years: 15.00    Types: Cigarettes    Start  date: 09/27/1973    Quit date: 10/15/1988    Years since quitting: 31.0  . Smokeless tobacco: Never Used  . Tobacco comment: quit smoking 14 years ago  Substance Use Topics  . Alcohol use: No    Alcohol/week: 0.0 standard drinks  . Drug use: No    Home Medications Prior to Admission medications   Medication Sig Start Date End Date Taking? Authorizing Provider  acetaminophen (TYLENOL) 500 MG tablet Take 2 tablets (1,000 mg total) by mouth every 8 (eight) hours. Patient taking differently: Take 1,000 mg by mouth every 8 (eight) hours as needed for moderate pain.  09/27/19 11/26/19  Mercy Riding, MD  albuterol (PROVENTIL HFA;VENTOLIN HFA) 108 (90 Base) MCG/ACT inhaler Inhale 2 puffs into the lungs every 6 (six) hours as needed for wheezing or shortness of breath. 11/05/17   Roxan Hockey, MD  albuterol (PROVENTIL) (2.5 MG/3ML) 0.083% nebulizer solution Take 3 mLs (2.5 mg total) by nebulization every 6 (six) hours as needed for wheezing or shortness of breath. 11/05/17   Roxan Hockey, MD  apixaban (ELIQUIS) 5 MG TABS tablet Take 1 tablet (5 mg total) by mouth 2 (two) times daily. 08/19/19   Bonnielee Haff, MD  gabapentin (NEURONTIN) 300 MG capsule Take 300 mg by mouth See admin instructions. 300 mg in the morning, 600 mg at bedtime 09/27/19   [provider]  glucose blood (ONETOUCH VERIO) test strip Use as instructed to check blood sugar 3 times a day Patient taking differently: 1 each by Other route See admin instructions. Use as instructed to check blood sugar 3 times a day 09/04/18   Philemon Kingdom, MD  lidocaine-prilocaine (EMLA) cream Apply 1 application topically as needed. Patient taking differently: Apply 1 application topically as needed (to numb port site.).  02/28/19   Volanda Napoleon, MD  metFORMIN (GLUCOPHAGE) 500 MG tablet Take 1 tablet (500 mg total) by mouth 2 (two) times daily with a meal. 02/13/19   Philemon Kingdom, MD  metoCLOPramide (REGLAN) 5 MG tablet Take 1 tablet  (5 mg total) by mouth every 8 (eight) hours as needed for nausea (before meals as needed for nausea.). 08/23/19   Arrien, Jimmy Picket, MD  metoprolol tartrate (LOPRESSOR) 25 MG tablet Take 0.5 tablets (12.5 mg total) by mouth 2 (two) times daily. 08/14/19   Swayze, Ava, DO  midodrine (PROAMATINE) 10 MG tablet Take 1 tablet (10 mg total) by mouth 3 (three) times daily with meals. 09/27/19   Mercy Riding, MD  Multiple Vitamins-Minerals (CENTRUM SILVER ADULT  50+) TABS Take 1 tablet once a day Patient taking differently: Take 1 tablet by mouth daily. Take 1 tablet once a day 02/20/19   Carollee Herter, Alferd Apa, DO  nitroGLYCERIN (NITROSTAT) 0.4 MG SL tablet Place 1 tablet (0.4 mg total) under the tongue every 5 (five) minutes as needed. Patient taking differently: Place 0.4 mg under the tongue every 5 (five) minutes as needed for chest pain.  01/14/19   Lorretta Harp, MD  nystatin (MYCOSTATIN/NYSTOP) powder Apply topically 3 (three) times daily. 08/14/19   Swayze, Ava, DO  ondansetron (ZOFRAN) 4 MG tablet Take 1 tablet (4 mg total) by mouth every 6 (six) hours as needed for nausea. 09/27/19   Mercy Riding, MD  oxybutynin (DITROPAN-XL) 5 MG 24 hr tablet Take 5 mg by mouth daily.    [provider]  pantoprazole (PROTONIX) 40 MG tablet Take 1 tablet (40 mg total) by mouth daily. 07/16/19   Ann Held, DO  rosuvastatin (CRESTOR) 20 MG tablet Take 1 tablet (20 mg total) by mouth daily at 6 PM. 08/14/19   Swayze, Ava, DO  sertraline (ZOLOFT) 50 MG tablet Take 1 tablet (50 mg total) by mouth daily. 02/20/19   Ann Held, DO  traZODone (DESYREL) 50 MG tablet Take 0.5 tablets (25 mg total) by mouth at bedtime as needed for sleep. 08/14/19   Swayze, Ava, DO  triamcinolone ointment (KENALOG) 0.1 % Apply 1 application topically 2 (two) times daily. For 14 days beginning 10/08/2019    [provider]    Allergies    Cephalexin, Adhesive [tape], Codeine, and Zocor [simvastatin -  high dose]  Review of Systems   Review of Systems  Constitutional: Positive for fatigue. Negative for chills and fever.  HENT: Negative for ear pain and sore throat.   Eyes: Negative for pain and visual disturbance.  Respiratory: Negative for cough and shortness of breath.   Cardiovascular: Negative for chest pain and palpitations.  Gastrointestinal: Positive for nausea. Negative for abdominal pain and vomiting.  Genitourinary: Negative for dysuria and hematuria.  Musculoskeletal: Negative for arthralgias and back pain.  Skin: Positive for rash. Negative for color change.  Neurological: Positive for weakness. Negative for dizziness, tremors, seizures, syncope, facial asymmetry, speech difficulty, light-headedness, numbness and headaches.  All other systems reviewed and are negative.   Physical Exam Updated Vital Signs  ED Triage Vitals [11/12/2019 1200]  Enc Vitals Group     BP (!) 73/64     Pulse      Resp (!) 21     Temp      Temp src      SpO2      Weight      Height      Head Circumference      Peak Flow      Pain Score      Pain Loc      Pain Edu?      Excl. in [place]?     Physical Exam Vitals and nursing note reviewed.  Constitutional:      General: She is in acute distress.     Appearance: She is well-developed. She is obese. She is ill-appearing.  HENT:     Head: Normocephalic and atraumatic.     Nose: Nose normal.     Mouth/Throat:     Mouth: Mucous membranes are dry.  Eyes:     Extraocular Movements: Extraocular movements intact.     Conjunctiva/sclera: Conjunctivae normal.  Pupils: Pupils are equal, round, and reactive to light.  Cardiovascular:     Rate and Rhythm: Normal rate and regular rhythm.     Pulses: Normal pulses.     Heart sounds: Normal heart sounds. No murmur.  Pulmonary:     Effort: Pulmonary effort is normal. No respiratory distress.     Breath sounds: Normal breath sounds.  Abdominal:     Palpations: Abdomen is soft.     Tenderness:  There is abdominal tenderness. There is no guarding.  Musculoskeletal:     Cervical back: Normal range of motion and neck supple. No tenderness.  Skin:    General: Skin is warm and dry.     Findings: Rash (stage one ulcer on low back) present.  Neurological:     General: No focal deficit present.     Mental Status: She is alert.     ED Results / Procedures / Treatments   Labs (all labs ordered are listed, but only abnormal results are displayed) Labs Reviewed  CBC WITH DIFFERENTIAL/PLATELET - Abnormal; Notable for the following components:      Result Value   WBC 14.9 (*)    RBC 3.01 (*)    Hemoglobin 9.0 (*)    HCT 26.8 (*)    RDW 17.7 (*)    Platelets 122 (*)    Neutro Abs 10.5 (*)    Eosinophils Absolute 1.4 (*)    Abs Immature Granulocytes 0.11 (*)    All other components within normal limits  BASIC METABOLIC PANEL - Abnormal; Notable for the following components:   CO2 19 (*)    Creatinine, Ser 2.39 (*)    Calcium 7.2 (*)    GFR calc non Af Amer 20 (*)    GFR calc Af Amer 24 (*)    All other components within normal limits  HEPATIC FUNCTION PANEL - Abnormal; Notable for the following components:   Total Protein 4.4 (*)    Albumin 1.0 (*)    AST 122 (*)    Alkaline Phosphatase 219 (*)    Total Bilirubin 1.4 (*)    Bilirubin, Direct 0.6 (*)    All other components within normal limits  LACTIC ACID, PLASMA - Abnormal; Notable for the following components:   Lactic Acid, Venous 3.4 (*)    All other components within normal limits  PROTIME-INR - Abnormal; Notable for the following components:   Prothrombin Time 33.5 (*)    INR 3.3 (*)    All other components within normal limits  URINE CULTURE  CULTURE, BLOOD (ROUTINE X 2)  CULTURE, BLOOD (ROUTINE X 2)  URINE CULTURE  LIPASE, BLOOD  URINALYSIS, ROUTINE W REFLEX MICROSCOPIC  LACTIC ACID, PLASMA  CBC  COMPREHENSIVE METABOLIC PANEL  MAGNESIUM  PHOSPHORUS  PROCALCITONIN  BRAIN NATRIURETIC PEPTIDE  CBC WITH  DIFFERENTIAL/PLATELET  PROTIME-INR  URINALYSIS, ROUTINE W REFLEX MICROSCOPIC    EKG EKG Interpretation  Date/Time:  Sunday November 03 2019 12:22:47 EDT Ventricular Rate:  86 PR Interval:    QRS Duration: 52 QT Interval:  371 QTC Calculation: 444 R Axis:   52 Text Interpretation: Sinus rhythm Ventricular premature complex Low voltage, extremity and precordial leads Nonspecific T abnormalities, lateral leads Confirmed by Lennice Sites [phone]) on 11/22/2019 12:27:29 PM   Radiology CT ABDOMEN PELVIS WO CONTRAST  Result Date: 11/22/2019 CLINICAL DATA:  Abdominal distension. Altered level of consciousness. History of urinary tract infections. EXAM: CT ABDOMEN AND PELVIS WITHOUT CONTRAST TECHNIQUE: Multidetector CT imaging of the abdomen and pelvis  was performed following the standard protocol without IV contrast. COMPARISON:  Abdominopelvic CT 10/22/2019. Chest CT 10/25/2019. FINDINGS: Study is limited by the lack of intravenous and enteric contrast and diffuse soft tissue edema. Lower chest: Enlarging moderate left and small right dependent pleural effusions. Underlying patchy ground-glass opacities at both lung bases, likely a combination of atelectasis and inflammation in this patient with history of COVID-19 infection. Hepatobiliary: Diffuse hepatic steatosis without focal abnormality on noncontrast imaging. The gallbladder remains significantly distended without calcified gallstones or focal surrounding inflammation. No significant biliary dilatation. Pancreas: Diffusely atrophy. No evidence of focal surrounding inflammation or pancreatic ductal dilatation. Spleen: Remains small without focal abnormality. Adrenals/Urinary Tract: Both adrenal glands appear normal. Stable mild left renal atrophy and cortical thinning. No evidence of urinary tract calculus or hydronephrosis. The bladder is partly obscured by artifact from the right total hip arthroplasty, although demonstrates no abnormality.  Stomach/Bowel: Mild diffuse nonspecific bowel wall thickening, mildly increased from previous study. Postsurgical changes in the stomach and distal colon. No evidence of bowel obstruction. Vascular/Lymphatic: There are no enlarged abdominal or pelvic lymph nodes. Aortic and branch vessel atherosclerosis. Reproductive: The uterus and ovaries appear normal. No adnexal mass. Other: Anasarca with increased edema throughout the subcutaneous and intra-abdominal fat. Increasing pelvic ascites. No extraluminal air or focal fluid collection identified. Musculoskeletal: No acute or significant osseous findings. Previous right total hip arthroplasty. Mild lumbar spondylosis. IMPRESSION: 1. Compared with the prior study from 10 days ago, worsening anasarca with increased edema throughout the soft tissues and increased pelvic ascites. Enlarging moderate left and small right dependent pleural effusions. 2. Underlying patchy ground-glass opacities at both lung bases, likely a combination of atelectasis and inflammation in this patient with history of COVID-19 infection. 3. Nonspecific bowel wall thickening, likely related to anasarca 4. Persistent gallbladder distention without calcified gallstones or focal surrounding inflammation. 5. Diffuse hepatic steatosis. 6. Aortic Atherosclerosis (ICD10-I70.0). Electronically Signed   By: Richardean Sale M.D.   On: 11/01/2019 14:30   DG Chest Portable 1 View  Result Date: 11/11/2019 CLINICAL DATA:  Altered mental status. EXAM: PORTABLE CHEST 1 VIEW COMPARISON:  10/24/2019 FINDINGS: Right IJ Port-A-Cath unchanged with tip over the SVC. Lungs are adequately inflated with minimal hazy density over the left lung worse in the left base and perihilar region. Possible minimal opacification over the lateral right base. Cardiomediastinal silhouette and remainder the exam is unchanged. IMPRESSION: Opacification of the left base, left perihilar region and possibly lateral right base. Findings may  be due to infection with small associated left pleural effusion. Right IJ Port-A-Cath unchanged. Electronically Signed   By: Marin Olp M.D.   On: 11/04/2019 13:22    Procedures .Critical Care Performed by: Lennice Sites, DO Authorized by: Lennice Sites, DO   Critical care provider statement:    Critical care time (minutes):  51   Critical care was necessary to treat or prevent imminent or life-threatening deterioration of the following conditions:  Circulatory failure and sepsis   Critical care was time spent personally by me on the following activities:  Blood draw for specimens, development of treatment plan with patient or surrogate, discussions with primary provider, evaluation of patient's response to treatment, examination of patient, obtaining history from patient or surrogate, ordering and performing treatments and interventions, ordering and review of laboratory studies, ordering and review of radiographic studies, re-evaluation of patient's condition, pulse oximetry and review of old charts   I assumed direction of critical care for this patient from another provider in  my specialty: no     (including critical care time)  Medications Ordered in ED Medications  midodrine (PROAMATINE) tablet 10 mg (has no administration in time range)  0.9 %  sodium chloride infusion (has no administration in time range)  norepinephrine (LEVOPHED) 37m in 2547mpremix infusion (5 mcg/min Intravenous New Bag/Given 11/14/2019 1453)  docusate sodium (COLACE) capsule 100 mg (has no administration in time range)  polyethylene glycol (MIRALAX / GLYCOLAX) packet 17 g (has no administration in time range)  famotidine (PEPCID) IVPB 20 mg premix (has no administration in time range)  0.9 %  sodium chloride infusion (has no administration in time range)  albuterol (PROVENTIL) (2.5 MG/3ML) 0.083% nebulizer solution 2.5 mg (has no administration in time range)  linezolid (ZYVOX) IVPB 600 mg (has no  administration in time range)  thiamine (B-1) 250 mg in sodium chloride 0.9 % 50 mL IVPB (has no administration in time range)  albumin human 25 % solution 25 g (has no administration in time range)  sodium chloride 0.9 % bolus 2,000 mL (0 mLs Intravenous Stopped 11/13/2019 1435)  meropenem (MERREM) 1 g in sodium chloride 0.9 % 100 mL IVPB (0 g Intravenous Stopped 11/02/2019 1330)  sodium chloride 0.9 % bolus 1,000 mL (0 mLs Intravenous Stopped 11/07/2019 1359)    ED Course  I have reviewed the triage vital signs and the nursing notes.  Pertinent labs & imaging results that were available during my care of the patient were reviewed by me and considered in my medical decision making (see chart for details).    MDM Rules/Calculators/A&P                      MaEVERLINE MAHAFFYs a [age]ear old female with history of diabetes, hypertension, gastric cancer in remission who presents to the ED with altered mental status.  Patient found hypotensive in the 8028Bystolic upon arrival.  Recent admissions for urinary tract infections and low blood pressure.  She is on midodrine for low blood pressure.  Had Covid several months ago and has been in a steady decline ever since.  Comes from a nursing facility.  Overall does not complain of shortness of breath, cough, abdominal pain.  She appears clinically dry.  Rectal temperature was normal.  However hypotensive.  Suspect infectious process.  Sepsis work-up initiated.  Patient given 30 cc/kg IV fluids.  After chart review decided to give patient IV meropenem as she has had multiple drug-resistant UTIs in the past.  This will also cover for respiratory source.  Patient with white count of 14.9, elevated creatinine at 2.39, lactic acid of 3.4.  Patient likely with septic shock from urinary source.  However unable to get urinalysis at this time.  In and out catheterization only brought back some pus.  Chest x-ray showed possible infectious process however could be atelectasis or  scarring from her recent Covid.  CT scan of the abdomen and pelvis showed anasarca, pleural effusion, ongoing atelectasis and patchy groundglass opacities in the lung likely from Covid.  Otherwise no acute infectious process of the abdomen.  After 30 cc/kg IV fluids patient still hypotensive in the upper 80s.  At that time patient started on IV Levophed and admitted to the ICU for further care.  This chart was dictated using voice recognition software.  Despite best efforts to proofread,  errors can occur which can change the documentation meaning.    Final Clinical Impression(s) / ED Diagnoses Final diagnoses:  Sepsis, due  to unspecified organism, unspecified whether acute organ dysfunction present [facility])  Septic shock [facility])    Rx / DC Orders ED Discharge Orders    None       Lennice Sites, DO 11/12/2019 1508

## 2019-11-03 NOTE — Progress Notes (Signed)
CRITICAL VALUE ALERT  Critical Value:  K 6.4  Date & Time Notied:  11/10/2019 1923  Provider Notified: Will redraw K level as sample was partially hemolyzed and then will notify MD. Night shift RN aware.  Joellen Jersey, RN

## 2019-11-04 ENCOUNTER — Telehealth: Payer: Self-pay | Admitting: *Deleted

## 2019-11-04 ENCOUNTER — Inpatient Hospital Stay (HOSPITAL_COMMUNITY): Payer: Medicare HMO

## 2019-11-04 DIAGNOSIS — I361 Nonrheumatic tricuspid (valve) insufficiency: Secondary | ICD-10-CM

## 2019-11-04 LAB — GLUCOSE, CAPILLARY
Glucose-Capillary: 116 mg/dL — ABNORMAL HIGH (ref 70–99)
Glucose-Capillary: 124 mg/dL — ABNORMAL HIGH (ref 70–99)
Glucose-Capillary: 152 mg/dL — ABNORMAL HIGH (ref 70–99)
Glucose-Capillary: 178 mg/dL — ABNORMAL HIGH (ref 70–99)
Glucose-Capillary: 232 mg/dL — ABNORMAL HIGH (ref 70–99)
Glucose-Capillary: 98 mg/dL (ref 70–99)
Glucose-Capillary: 99 mg/dL (ref 70–99)

## 2019-11-04 LAB — AMMONIA: Ammonia: 163 umol/L — ABNORMAL HIGH (ref 9–35)

## 2019-11-04 LAB — POCT I-STAT 7, (LYTES, BLD GAS, ICA,H+H)
Acid-base deficit: 12 mmol/L — ABNORMAL HIGH (ref 0.0–2.0)
Acid-base deficit: 11 mmol/L — ABNORMAL HIGH (ref 0.0–2.0)
Acid-base deficit: 13 mmol/L — ABNORMAL HIGH (ref 0.0–2.0)
Acid-base deficit: 15 mmol/L — ABNORMAL HIGH (ref 0.0–2.0)
Bicarbonate: 12.8 mmol/L — ABNORMAL LOW (ref 20.0–28.0)
Bicarbonate: 14.8 mmol/L — ABNORMAL LOW (ref 20.0–28.0)
Bicarbonate: 11.4 mmol/L — ABNORMAL LOW (ref 20.0–28.0)
Bicarbonate: 10.7 mmol/L — ABNORMAL LOW (ref 20.0–28.0)
Calcium, Ion: 1.05 mmol/L — ABNORMAL LOW (ref 1.15–1.40)
Calcium, Ion: 0.99 mmol/L — ABNORMAL LOW (ref 1.15–1.40)
Calcium, Ion: 0.97 mmol/L — ABNORMAL LOW (ref 1.15–1.40)
Calcium, Ion: 0.97 mmol/L — ABNORMAL LOW (ref 1.15–1.40)
HCT: 27 % — ABNORMAL LOW (ref 36.0–46.0)
HCT: 27 % — ABNORMAL LOW (ref 36.0–46.0)
HCT: 26 % — ABNORMAL LOW (ref 36.0–46.0)
HCT: 22 % — ABNORMAL LOW (ref 36.0–46.0)
Hemoglobin: 9.2 g/dL — ABNORMAL LOW (ref 12.0–15.0)
Hemoglobin: 9.2 g/dL — ABNORMAL LOW (ref 12.0–15.0)
Hemoglobin: 8.8 g/dL — ABNORMAL LOW (ref 12.0–15.0)
Hemoglobin: 7.5 g/dL — ABNORMAL LOW (ref 12.0–15.0)
O2 Saturation: 81 %
O2 Saturation: 98 %
O2 Saturation: 95 %
O2 Saturation: 86 %
Patient temperature: 35.6
Patient temperature: 98.4
Patient temperature: 36.1
Patient temperature: 35.9
Potassium: 4.5 mmol/L (ref 3.5–5.1)
Potassium: 4.9 mmol/L (ref 3.5–5.1)
Potassium: 5 mmol/L (ref 3.5–5.1)
Potassium: 4.6 mmol/L (ref 3.5–5.1)
Sodium: 140 mmol/L (ref 135–145)
Sodium: 140 mmol/L (ref 135–145)
Sodium: 141 mmol/L (ref 135–145)
Sodium: 140 mmol/L (ref 135–145)
TCO2: 14 mmol/L — ABNORMAL LOW (ref 22–32)
TCO2: 16 mmol/L — ABNORMAL LOW (ref 22–32)
TCO2: 12 mmol/L — ABNORMAL LOW (ref 22–32)
TCO2: 11 mmol/L — ABNORMAL LOW (ref 22–32)
pCO2 arterial: 24.8 mmHg — ABNORMAL LOW (ref 32.0–48.0)
pCO2 arterial: 32.2 mmHg (ref 32.0–48.0)
pCO2 arterial: 22.2 mmHg — ABNORMAL LOW (ref 32.0–48.0)
pCO2 arterial: 22.7 mmHg — ABNORMAL LOW (ref 32.0–48.0)
pH, Arterial: 7.314 — ABNORMAL LOW (ref 7.350–7.450)
pH, Arterial: 7.269 — ABNORMAL LOW (ref 7.350–7.450)
pH, Arterial: 7.315 — ABNORMAL LOW (ref 7.350–7.450)
pH, Arterial: 7.276 — ABNORMAL LOW (ref 7.350–7.450)
pO2, Arterial: 45 mmHg — ABNORMAL LOW (ref 83.0–108.0)
pO2, Arterial: 128 mmHg — ABNORMAL HIGH (ref 83.0–108.0)
pO2, Arterial: 78 mmHg — ABNORMAL LOW (ref 83.0–108.0)
pO2, Arterial: 53 mmHg — ABNORMAL LOW (ref 83.0–108.0)

## 2019-11-04 LAB — BASIC METABOLIC PANEL
Anion gap: 15 (ref 5–15)
Anion gap: 19 — ABNORMAL HIGH (ref 5–15)
BUN: 15 mg/dL (ref 8–23)
BUN: 17 mg/dL (ref 8–23)
CO2: 14 mmol/L — ABNORMAL LOW (ref 22–32)
CO2: 10 mmol/L — ABNORMAL LOW (ref 22–32)
Calcium: 7.1 mg/dL — ABNORMAL LOW (ref 8.9–10.3)
Calcium: 6.5 mg/dL — ABNORMAL LOW (ref 8.9–10.3)
Chloride: 113 mmol/L — ABNORMAL HIGH (ref 98–111)
Chloride: 110 mmol/L (ref 98–111)
Creatinine, Ser: 2.54 mg/dL — ABNORMAL HIGH (ref 0.44–1.00)
Creatinine, Ser: 2.66 mg/dL — ABNORMAL HIGH (ref 0.44–1.00)
GFR calc Af Amer: 22 mL/min — ABNORMAL LOW (ref >60)
GFR calc Af Amer: 21 mL/min — ABNORMAL LOW (ref >60)
GFR calc non Af Amer: 19 mL/min — ABNORMAL LOW (ref >60)
GFR calc non Af Amer: 18 mL/min — ABNORMAL LOW (ref >60)
Glucose, Bld: 117 mg/dL — ABNORMAL HIGH (ref 70–99)
Glucose, Bld: 145 mg/dL — ABNORMAL HIGH (ref 70–99)
Potassium: 5.1 mmol/L (ref 3.5–5.1)
Potassium: 4.9 mmol/L (ref 3.5–5.1)
Sodium: 142 mmol/L (ref 135–145)
Sodium: 139 mmol/L (ref 135–145)

## 2019-11-04 LAB — HEPATIC FUNCTION PANEL
ALT: 30 U/L (ref 0–44)
AST: 136 U/L — ABNORMAL HIGH (ref 15–41)
Albumin: 1.6 g/dL — ABNORMAL LOW (ref 3.5–5.0)
Alkaline Phosphatase: 224 U/L — ABNORMAL HIGH (ref 38–126)
Bilirubin, Direct: 0.8 mg/dL — ABNORMAL HIGH (ref 0.0–0.2)
Indirect Bilirubin: 0.9 mg/dL (ref 0.3–0.9)
Total Bilirubin: 1.7 mg/dL — ABNORMAL HIGH (ref 0.3–1.2)
Total Protein: 5 g/dL — ABNORMAL LOW (ref 6.5–8.1)

## 2019-11-04 LAB — PROTIME-INR
INR: 3.6 — ABNORMAL HIGH (ref 0.8–1.2)
Prothrombin Time: 35.8 seconds — ABNORMAL HIGH (ref 11.4–15.2)

## 2019-11-04 LAB — CBC
HCT: 28.4 % — ABNORMAL LOW (ref 36.0–46.0)
Hemoglobin: 9.4 g/dL — ABNORMAL LOW (ref 12.0–15.0)
MCH: 30 pg (ref 26.0–34.0)
MCHC: 33.1 g/dL (ref 30.0–36.0)
MCV: 90.7 fL (ref 80.0–100.0)
Platelets: 108 10*3/uL — ABNORMAL LOW (ref 150–400)
RBC: 3.13 MIL/uL — ABNORMAL LOW (ref 3.87–5.11)
RDW: 17.7 % — ABNORMAL HIGH (ref 11.5–15.5)
WBC: 15.9 10*3/uL — ABNORMAL HIGH (ref 4.0–10.5)
nRBC: 0.2 % (ref 0.0–0.2)

## 2019-11-04 LAB — MAGNESIUM: Magnesium: 2.1 mg/dL (ref 1.7–2.4)

## 2019-11-04 LAB — PHOSPHORUS: Phosphorus: 4 mg/dL (ref 2.5–4.6)

## 2019-11-04 LAB — ECHOCARDIOGRAM COMPLETE

## 2019-11-04 LAB — LACTIC ACID, PLASMA
Lactic Acid, Venous: 5.4 mmol/L (ref 0.5–1.9)
Lactic Acid, Venous: 5.3 mmol/L (ref 0.5–1.9)
Lactic Acid, Venous: 5.5 mmol/L (ref 0.5–1.9)

## 2019-11-04 LAB — CK: Total CK: 830 U/L — ABNORMAL HIGH (ref 38–234)

## 2019-11-04 MED ORDER — PERFLUTREN LIPID MICROSPHERE
1.0000 mL | INTRAVENOUS | Status: AC | PRN
  Administered 2019-11-04: 5 mL via INTRAVENOUS
  Filled 2019-11-04: qty 10

## 2019-11-04 MED ORDER — NOREPINEPHRINE 4 MG/250ML-% IV SOLN
0.0000 ug/min | INTRAVENOUS | Status: DC
  Filled 2019-11-04: qty 250

## 2019-11-04 MED ORDER — SODIUM CHLORIDE 0.9 % IV BOLUS
500.0000 mL | Freq: Once | INTRAVENOUS | Status: AC
  Administered 2019-11-04: 500 mL via INTRAVENOUS

## 2019-11-04 MED ORDER — SODIUM CHLORIDE 0.9 % IV SOLN
1.0000 g | Freq: Two times a day (BID) | INTRAVENOUS | Status: DC
  Administered 2019-11-04 – 2019-11-05 (×3): 1 g via INTRAVENOUS
  Filled 2019-11-04 (×4): qty 1

## 2019-11-04 MED ORDER — FENTANYL 2500MCG IN NS 250ML (10MCG/ML) PREMIX INFUSION
0.0000 ug/h | INTRAVENOUS | Status: DC
  Administered 2019-11-04: 200 ug/h via INTRAVENOUS
  Filled 2019-11-04: qty 250

## 2019-11-04 MED ORDER — MIDAZOLAM HCL 2 MG/2ML IJ SOLN
INTRAMUSCULAR | Status: AC
  Filled 2019-11-04: qty 2

## 2019-11-04 MED ORDER — SODIUM BICARBONATE-DEXTROSE 150-5 MEQ/L-% IV SOLN
150.0000 meq | INTRAVENOUS | Status: DC
  Administered 2019-11-04 – 2019-11-05 (×4): 150 meq via INTRAVENOUS
  Filled 2019-11-04 (×6): qty 1000

## 2019-11-04 MED ORDER — HYDROCORTISONE NA SUCCINATE PF 100 MG IJ SOLR
100.0000 mg | Freq: Three times a day (TID) | INTRAMUSCULAR | Status: DC
  Administered 2019-11-04 – 2019-11-05 (×4): 100 mg via INTRAVENOUS
  Filled 2019-11-04 (×5): qty 2

## 2019-11-04 MED ORDER — NOREPINEPHRINE 16 MG/250ML-% IV SOLN
0.0000 ug/min | INTRAVENOUS | Status: DC
  Administered 2019-11-04: 45 ug/min via INTRAVENOUS
  Administered 2019-11-05: 55 ug/min via INTRAVENOUS
  Filled 2019-11-04 (×3): qty 250

## 2019-11-04 MED ORDER — LACTULOSE 10 GM/15ML PO SOLN
30.0000 g | Freq: Two times a day (BID) | ORAL | Status: DC
  Administered 2019-11-04 – 2019-11-05 (×3): 30 g via ORAL
  Filled 2019-11-04 (×3): qty 45

## 2019-11-04 MED ORDER — VASOPRESSIN 20 UNIT/ML IV SOLN
0.0300 [IU]/min | INTRAVENOUS | Status: DC
  Administered 2019-11-04 (×2): 0.03 [IU]/min via INTRAVENOUS
  Filled 2019-11-04 (×2): qty 2

## 2019-11-04 MED ORDER — SODIUM CHLORIDE 0.9 % IV SOLN: INTRAVENOUS | Status: DC | PRN

## 2019-11-04 MED ORDER — FENTANYL CITRATE (PF) 100 MCG/2ML IJ SOLN
25.0000 ug | Freq: Once | INTRAMUSCULAR | Status: AC
  Administered 2019-11-04: 25 ug via INTRAVENOUS
  Filled 2019-11-04: qty 2

## 2019-11-04 MED ORDER — FENTANYL CITRATE (PF) 100 MCG/2ML IJ SOLN
INTRAMUSCULAR | Status: AC
  Administered 2019-11-04: 25 ug
  Filled 2019-11-04: qty 2

## 2019-11-04 MED ORDER — GERHARDT'S BUTT CREAM
TOPICAL_CREAM | Freq: Two times a day (BID) | CUTANEOUS | Status: DC
  Administered 2019-11-05: 1 via TOPICAL
  Filled 2019-11-04: qty 1

## 2019-11-04 MED ORDER — NOREPINEPHRINE 16 MG/250ML-% IV SOLN
0.0000 ug/min | INTRAVENOUS | Status: DC
  Administered 2019-11-04: 24 ug/min via INTRAVENOUS
  Administered 2019-11-04: 50 ug/min via INTRAVENOUS
  Filled 2019-11-04 (×3): qty 250

## 2019-11-04 MED ORDER — LACTULOSE ENEMA
300.0000 mL | Freq: Once | ORAL | Status: DC
  Filled 2019-11-04: qty 300

## 2019-11-04 MED ORDER — INSULIN ASPART 100 UNIT/ML ~~LOC~~ SOLN
0.0000 [IU] | SUBCUTANEOUS | Status: DC
  Administered 2019-11-04 – 2019-11-05 (×2): 3 [IU] via SUBCUTANEOUS
  Administered 2019-11-05: 5 [IU] via SUBCUTANEOUS
  Administered 2019-11-05: 3 [IU] via SUBCUTANEOUS

## 2019-11-04 NOTE — Progress Notes (Signed)
eLink Physician-Brief Progress Note Patient Name: [name] DOB: [birth year] MRN: [id number]   Date of Service  11/04/2019  HPI/Events of Note  Multiple issues: 1. Ventilator asynchrony, 2. Hyperglycemia - Blood glucose = 178 and 3. Oliguria.   eICU Interventions  Will order: 1. Increase PRVC rate to 32.  2. Repeat ABG at 7:30 PM. 3. Q 4 hour sensitive Novolog SSI. 4. Monitor CVP now and Q 4 hours.      Intervention Category Major Interventions: Acid-Base disturbance - evaluation and management;Respiratory failure - evaluation and management;Hyperglycemia - active titration of insulin therapy  [name] 11/04/2019, 8:34 PM

## 2019-11-04 NOTE — Progress Notes (Signed)
Spoke with the patient's daughter, Mearl Harewood at [phone]. We discussed that we are treating the patient for septic shock and the elevated mortality associated with multiorgan failure. The last 4 months have been difficult for the patient and her family. Because of the multiple admission they have had conversations about code status and intubation. Currently based on prior conversations they believe the patient would want full aggressive care including intubation and dialysis if needed.   We will continue to keep the family updated and Alanna will discuss everything with her siblings.   Ina Homes, MD  IMTS PGY3  Pager: [phone]

## 2019-11-04 NOTE — Progress Notes (Signed)
eLink Physician-Brief Progress Note Patient Name: [name] DOB: 1950/12/15 MRN: [id number]   Date of Service  11/04/2019  HPI/Events of Note  Increasing pressor requirement overnight. On linezolid and meropenem due to history of VRE and ESBL E coli but none growing at this time. Also increasing lactate and more lethargic this morning. Still receiving albumin. CT abdomen yesterday without clear focus of infection. Urine with 6-10 WBC. Patient remains hypothermic. Glucose 124. Oligoanuric.  eICU Interventions  ABG ordered 7.31/24/45 Will give another 500 cc NS bolus but patient with significant 3rd spacing so will need to be judicious in volume resuscitation. Bedside CCM team informed.     Intervention Category Major Interventions: Hypotension - evaluation and management;Change in mental status - evaluation and management  Judd Lien 11/04/2019, 6:14 AM

## 2019-11-04 NOTE — Progress Notes (Signed)
Attempted to contact daughter, Ally Knodel at [phone]. No answer. Will try again later.   Ina Homes, MD  IMTS PGY3  Pager: [phone]

## 2019-11-04 NOTE — Telephone Encounter (Signed)
Call received from patient's daughter, [name] to notify Dr. Marin Olp that pt has been admitted to [facility] with sepsis.  Dr. Marin Olp notified.

## 2019-11-04 NOTE — Progress Notes (Signed)
Became more obtunded through day  Sonorous respirations  Worsening Bps  Discussed gravity of situation with family and they would like trial of full ICU scope of care understanding prognosis is grim.  To intubate and place on vent, recheck BMP, may need CRRT but not sure will tolerate.

## 2019-11-04 NOTE — Progress Notes (Signed)
Attempted to contact daughter, Lariya Kinzie at [phone]. No answer. Will try again later.   Ina Homes, MD  IMTS PGY3  Pager: [phone]

## 2019-11-04 NOTE — Procedures (Signed)
Intubation Procedure Note [name] [id number] 10-Jul-1951  Procedure: Intubation Indications: Airway protection and maintenance  Procedure Details Consent: Risks of procedure as well as the alternatives and risks of each were explained to the (patient/caregiver).  Consent for procedure obtained. Time Out: Verified patient identification, verified procedure, site/side was marked, verified correct patient position, special equipment/implants available, medications/allergies/relevent history reviewed, required imaging and test results available.  Performed  Maximum sterile technique was used including antiseptics, gloves, gown, hand hygiene and mask.  MAC  Evaluation Hemodynamic Status: Transient hypotension resolved spontaneously; O2 sats: stable throughout Patient's Current Condition: stable Complications: No apparent complications Patient did tolerate procedure well. Chest X-ray ordered to verify placement.  CXR: pending.   Dr. Jose Persia Internal Medicine PGY-1  Pager: [phone] 11/04/2019, 1:05 PM

## 2019-11-04 NOTE — Progress Notes (Signed)
RT obtained ABG on pt with the following results. RT will continue to monitor. Oxygen increased based on PaO2 on gas of 45.   Results for ADEOLA, DENNEN (MRN [id number]) as of 11/04/2019 06:44  Ref. Range 11/04/2019 06:26  Sample type Unknown ARTERIAL  pH, Arterial Latest Ref Range: 7.350 - 7.450  7.314 (L)  pCO2 arterial Latest Ref Range: 32.0 - 48.0 mmHg 24.8 (L)  pO2, Arterial Latest Ref Range: 83.0 - 108.0 mmHg 45.0 (L)  TCO2 Latest Ref Range: 22 - 32 mmol/L 14 (L)  Acid-base deficit Latest Ref Range: 0.0 - 2.0 mmol/L 12.0 (H)  Bicarbonate Latest Ref Range: 20.0 - 28.0 mmol/L 12.8 (L)  O2 Saturation Latest Units: % 81.0  Patient temperature Unknown 35.6 C  Collection site Unknown RADIAL, ALLEN'S TEST ACCEPTABLE

## 2019-11-04 NOTE — Progress Notes (Signed)
Attempted to contact daughter, Evvie Behrmann at [phone]. No answer. Will try again later.   Ina Homes, MD  IMTS PGY3  Pager: [phone]

## 2019-11-04 NOTE — Procedures (Signed)
Name: [name] MRN: [id number] DOB: 08-11-1950  DOS: 11/04/2019  PROCEDURE NOTE  Procedure:  Arterial catheter placement.  Indications:  Need for invasive hemodynamic monitoring / frequent arterial blood gases measurement.  Consent:  Consent was implied due to the emergency nature of the procedure.  Procedure summary:  The patient was identified as [name] and safety timeout was performed. Collateral arterial flow was confirmed by performing Allen's test. Sterile technique was used. The patient's left wrist was prepped using chlorhexidine / alcohol scrub and the field was draped in usual sterile fashion with protective barrier. The distal radial artery was cannulated without difficulty. Blood was aspirated and the catheter was flushed with normal saline without difficulty. Good arterial waveform was obtained. The catheter was secured into place with sterile dressing.  Complications:  No immediate complications were noted.  Estimated blood loss:  Less then 5 mL  Marianna Payment, D.O. IMTP, PGY-1 Phone: [phone] Pager: [phone]  11/04/2019, 1:46 PM

## 2019-11-04 NOTE — Procedures (Signed)
Central Venous Catheter Insertion Procedure Note KRISTIANNA SAPERSTEIN [id number] 04-14-1951  Procedure: Insertion of Central Venous Catheter Indications: Drug and/or fluid administration  Procedure Details Consent: Risks of procedure as well as the alternatives and risks of each were explained to the (patient/caregiver).  Consent for procedure obtained. Time Out: Verified patient identification, verified procedure, site/side was marked, verified correct patient position, special equipment/implants available, medications/allergies/relevent history reviewed, required imaging and test results available.  Performed  Maximum sterile technique was used including antiseptics, cap, gloves, gown, hand hygiene, mask and sheet. Skin prep: Chlorhexidine; local anesthetic administered A antimicrobial bonded/coated triple lumen catheter was placed in the left internal jugular vein using the Seldinger technique. Catheter placed to 20 cm. Blood aspirated via all 3 ports and then flushed x 3. Line sutured x 1 and dressing applied.  Ultrasound guidance used.Yes.    Evaluation Blood flow good Complications: No apparent complications Patient did tolerate procedure well. Chest X-ray ordered to verify placement.  CXR: pending.  Dr. Jose Persia Internal Medicine PGY-1  Pager: [phone] 11/04/2019, 1:06 PM

## 2019-11-04 NOTE — Progress Notes (Signed)
  Echocardiogram 2D Echocardiogram has been performed.  Geoffery Lyons Swaim 11/04/2019, 8:45 AM

## 2019-11-04 NOTE — Progress Notes (Signed)
Ok to add scheduled Merrem order per Dr. Tarri Abernethy. Scr 2.54 CrCl ~25 ml/min  Merrem 1g IV q12  Onnie Boer, PharmD, [place], AAHIVP, CPP Infectious Disease Pharmacist 11/04/2019 9:32 AM

## 2019-11-04 NOTE — Progress Notes (Signed)
NAME:  [name], MRN:  [id number], DOB:  Dec 16, 1950, LOS: 1 ADMISSION DATE:  11/07/2019, CONSULTATION DATE:  11/18/2019 REFERRING MD:  EDP, CHIEF COMPLAINT:  Septic Shock   Brief History   [name] is a 69 y.o female with a hx of COVID-19 PNA, chronic hypotension on midodrine, and multiple hospitalizations for recurrent UTIs who presented to the ED with encephalopathy. She was found to be hypotensive with acute on chronic renal failure and lactic acidosis. She received volume resuscitation with 30cc/kg but remained hypotensive requiring Levophed. We were subsequently consulted for for further evaluation/management of her septic shock.   Past Medical History   Past Medical History:  Diagnosis Date  . Asthma   . Degenerative joint disease   . Diabetes mellitus   . Dyspnea   . GERD (gastroesophageal reflux disease)   . Hyperlipidemia    no longer on medication for this  . Hypertension    patient denies  . Myocardial infarction ([place]) 08/2018  . Neuropathy    bilateral hands and feet  . Sickle cell trait ([place])   . Sleep apnea   . stomach ca dx'd 2010   chemo/xrt comp 05/2009  . TIA (transient ischemic attack) 07/2015   Significant Hospital Events   4/11: Admitted with septic shock   Consults:  None  Procedures:  None  Significant Diagnostic Tests:  4/11 CT Abdomen / Pelvis without Contrast >>  1. Compared with the prior study from 10 days ago, worsening anasarca with increased edema throughout the soft tissues and increased pelvic ascites. Enlarging moderate left and small right dependent pleural effusions. 2. Underlying patchy ground-glass opacities at both lung bases, likely a combination of atelectasis and inflammation in this patient with history of COVID-19 infection. 3. Nonspecific bowel wall thickening, likely related to anasarca 4. Persistent gallbladder distention without calcified gallstones or focal surrounding inflammation. 5. Diffuse hepatic steatosis.  6. Aortic Atherosclerosis (ICD10-I70.0).  Micro Data:  4/11 Blood cultures >> pending  4/11 Urine cultures >> pending  Antimicrobials:  Linezolid 4/11 >> Meropenem 4/11 >>  Interim history/subjective:  Worsening encephalopathy and hypotension.  Persistently elevated LA.   Objective   Blood pressure (!) 101/50, pulse 93, temperature (!) 96.3 F (35.7 C), resp. rate 19, SpO2 91 %.        Intake/Output Summary (Last 24 hours) at 11/04/2019 0641 Last data filed at 11/04/2019 0600 Gross per 24 hour  Intake 1993.22 ml  Output 440 ml  Net 1553.22 ml   There were no vitals filed for this visit.  Examination: General: Obese female, grimace to pain HENT: Normocephalic, atraumatic, moist mucus membranes Pulm: Good air movement with no wheezing or crackles  CV: Tachycardic but regular rhythm, no murmurs, no rubs  Abdomen: Active bowel sounds, soft, non-distended, no tenderness to palpation  Extremities: Pulses palpable in all extremities, 2+ pitting LE edema  Skin: Warm and dry  Neuro: Lethargic, groans and grimace to pain   Resolved Hospital Problem list   None  Assessment & Plan:   Septic shock ESBL E. coli history, vancomycin resistant Enterococcus history - Hypothermic overnight -- Lactic acid remains >5. Repeat -- Remains on Levo 34 mcg for MAP support. Wean as tolerated for MAP >65. May need to add another agent.  -- Place A-line for better BP monitoring  - Continue meropenem and linezolid. Will narrow based on cultures.   Acute renal failure -- Hemodynamically mediated. Persistent hypotension. Setting up for ATN -- Monitor UOP and avoid nephrotoxic medications.  Anasarca Bilateral extremity edema Chronic diastolic heart failure Mildly elevated pulmonary artery systolic pressure on previous echocardiogram Hypoalbuminemia - Repeat echocardiogram pending - Hold additional volume at this time, start pressors - Expect will need diuresis  Coagulopathy On  apixaban PTA History of DVT - No intervention at this time, no evidence of bleeding  Anemia of chronic disease - Continue to monitor   Low platelets, thrombocytopenia - Continue to monitor   Best practice:  Diet: soft Pain/Anxiety/Delirium protocol (if indicated): none  VAP protocol (if indicated): na DVT prophylaxis: heparin  GI prophylaxis: H2B Glucose control: SSI Mobility: bed Code Status: FULL Family Communication: discssed with daughter Disposition: ICU   Labs   CBC: Recent Labs  Lab 10/29/19 0354 11/13/2019 1208 11/18/2019 1802 11/04/2019 2022 11/04/19 0147  WBC 11.5* 14.9* 14.9* 15.0* 15.9*  NEUTROABS  --  10.5* 12.6* 12.6*  --   HGB 8.4* 9.0* 9.9* 8.7* 9.4*  HCT 23.6* 26.8* 29.2* 27.0* 28.4*  MCV 84.6 89.0 88.8 91.5 90.7  PLT 169 122* PLATELET CLUMPS NOTED ON SMEAR, UNABLE TO ESTIMATE 116* 108*    Basic Metabolic Panel: Recent Labs  Lab 10/29/19 0354 10/30/19 0436 11/22/2019 1208 11/19/2019 1802 11/17/2019 2022 11/04/19 0147  NA 144  --  139 134* 137 142  K 3.8  --  5.0 6.4* 4.8 5.1  CL 113*  --  111 107 106 113*  CO2 23  --  19* 14* 15* 14*  GLUCOSE 111*  --  73 84 107* 117*  BUN 10  --  17 15 17 15   CREATININE 1.21*  --  2.39* 2.46* 2.48* 2.54*  CALCIUM 6.7*  --  7.2* 7.1* 7.3* 7.1*  MG 1.8 2.1  --   --  2.2 2.1  PHOS  --   --   --   --  3.8 4.0   GFR: Estimated Creatinine Clearance: 25.5 mL/min (A) (by C-G formula based on SCr of 2.54 mg/dL (H)). Recent Labs  Lab 10/26/2019 1208 11/06/2019 1445 11/02/2019 1802 11/19/2019 2022 11/04/19 0147 11/04/19 0455  PROCALCITON  --   --   --  1.40  --   --   WBC 14.9*  --  14.9* 15.0* 15.9*  --   LATICACIDVEN 3.4* 2.8*  --   --   --  5.4*    Liver Function Tests: Recent Labs  Lab 11/10/2019 1208 11/06/2019 1802  AST 122* 141*  ALT 28 28  ALKPHOS 219* 244*  BILITOT 1.4* 1.5*  PROT 4.4* 5.4*  ALBUMIN 1.0* 1.6*   Recent Labs  Lab 10/28/2019 1208  LIPASE 11   No results for input(s): AMMONIA in the last  168 hours.  ABG    Component Value Date/Time   PHART 7.402 (H) 01/19/2009 1611   PCO2ART 42.4 01/19/2009 1611   PO2ART 91.0 01/19/2009 1611   HCO3 25.8 (H) 01/19/2009 1611   TCO2 28 12/17/2016 1539   O2SAT 97.1 01/19/2009 1611     Coagulation Profile: Recent Labs  Lab 10/26/2019 1208 10/31/2019 2359  INR 3.3* 3.6*    Cardiac Enzymes: No results for input(s): CKTOTAL, CKMB, CKMBINDEX, TROPONINI in the last 168 hours.  HbA1C: Hemoglobin A1c  Date/Time Value Ref Range Status  06/27/2016 11:43 AM 7.1 (H) 4.8 - 5.6 % Final    Comment:             Pre-diabetes: 5.7 - 6.4          Diabetes: >6.4  Glycemic control for adults with diabetes: <7.0    Hgb A1c MFr Bld  Date/Time Value Ref Range Status  09/27/2019 08:10 AM 5.6 4.8 - 5.6 % Final    Comment:    (NOTE) Pre diabetes:          5.7%-6.4% Diabetes:              >6.4% Glycemic control for   <7.0% adults with diabetes   07/27/2019 02:40 AM 8.7 (H) 4.8 - 5.6 % Final    Comment:    (NOTE) Pre diabetes:          5.7%-6.4% Diabetes:              >6.4% Glycemic control for   <7.0% adults with diabetes     CBG: Recent Labs  Lab 10/30/19 1132 10/30/19 1646 11/02/2019 2001 10/25/2019 2346 11/04/19 0410  GLUCAP 132* 204* 91 113* 116*    Review of Systems:   12 point ROS preformed. All negative aside from those mentioned in the HPI.  Past Medical History  She,  has a past medical history of Asthma, Degenerative joint disease, Diabetes mellitus, Dyspnea, GERD (gastroesophageal reflux disease), Hyperlipidemia, Hypertension, Myocardial infarction ([place]) (08/2018), Neuropathy, Sickle cell trait ([place]), Sleep apnea, stomach ca (dx'd 2010), and TIA (transient ischemic attack) (07/2015).   Surgical History    Past Surgical History:  Procedure Laterality Date  . APPENDECTOMY    . CESAREAN SECTION    . COLON SURGERY     colonscopy  . COLONOSCOPY WITH PROPOFOL N/A 06/01/2016   Procedure: COLONOSCOPY WITH PROPOFOL;   Surgeon: Wilford Corner, MD;  Location: [facility] ENDOSCOPY;  Service: Endoscopy;  Laterality: N/A;  . CORONARY STENT INTERVENTION N/A 08/29/2018   Procedure: CORONARY STENT INTERVENTION;  Surgeon: Wellington Hampshire, MD;  Location: [place] CV LAB;  Service: Cardiovascular;  Laterality: N/A;  . CORONARY/GRAFT ACUTE MI REVASCULARIZATION N/A 08/27/2018   Procedure: Coronary/Graft Acute MI Revascularization;  Surgeon: Leonie Man, MD;  Location: [place] CV LAB;  Service: Cardiovascular;  Laterality: N/A;  . ESOPHAGOGASTRODUODENOSCOPY N/A 10/15/2012   Procedure: ESOPHAGOGASTRODUODENOSCOPY (EGD);  Surgeon: Lear Ng, MD;  Location: [facility] ENDOSCOPY;  Service: Endoscopy;  Laterality: N/A;  . ESOPHAGOGASTRODUODENOSCOPY N/A 01/15/2016   Procedure: ESOPHAGOGASTRODUODENOSCOPY (EGD);  Surgeon: Ronald Lobo, MD;  Location: [facility] ENDOSCOPY;  Service: Endoscopy;  Laterality: N/A;  . ESOPHAGOGASTRODUODENOSCOPY (EGD) WITH PROPOFOL N/A 09/04/2019   Procedure: ESOPHAGOGASTRODUODENOSCOPY (EGD) WITH PROPOFOL;  Surgeon: Ronald Lobo, MD;  Location: [facility];  Service: Endoscopy;  Laterality: N/A;  possible esophageal dilatation  . HERNIA REPAIR    . LEFT HEART CATH AND CORONARY ANGIOGRAPHY N/A 08/27/2018   Procedure: LEFT HEART CATH AND CORONARY ANGIOGRAPHY;  Surgeon: Leonie Man, MD;  Location: [place] CV LAB;  Service: Cardiovascular;  Laterality: N/A;  . SHOULDER SURGERY     Left  . TOTAL HIP ARTHROPLASTY     Right  . TOTAL KNEE ARTHROPLASTY     Left     Social History   reports that she quit smoking about 31 years ago. Her smoking use included cigarettes. She started smoking about 46 years ago. She has a 15.00 pack-year smoking history. She has never used smokeless tobacco. She reports that she does not drink alcohol or use drugs.   Family History   Her family history includes Alzheimer's disease (age of onset: 37) in her father; Cancer in her maternal grandfather; Cancer (age of onset:  47) in her mother; Diabetes in her brother and maternal  grandmother; Healthy in her child. There is no history of Suicidality, Depression, Dementia, or Anxiety disorder.   Allergies Allergies  Allergen Reactions  . Cephalexin Itching, Rash and Other (See Comments)    Sensation of throat closing. Tolerated pip/tazo, cefepime, ceftriaxone before   . Adhesive [Tape] Other (See Comments)    Burn Skin  . Codeine Other (See Comments)    paranoid  . Zocor [Simvastatin - High Dose] Other (See Comments)    Muscle spasms     Home Medications  Prior to Admission medications   Medication Sig Start Date End Date Taking? Authorizing Provider  acetaminophen (TYLENOL) 500 MG tablet Take 2 tablets (1,000 mg total) by mouth every 8 (eight) hours. Patient taking differently: Take 1,000 mg by mouth every 8 (eight) hours as needed for moderate pain.  09/27/19 11/26/19 Yes Mercy Riding, MD  albuterol (PROVENTIL HFA;VENTOLIN HFA) 108 (90 Base) MCG/ACT inhaler Inhale 2 puffs into the lungs every 6 (six) hours as needed for wheezing or shortness of breath. 11/05/17  Yes Emokpae, Courage, MD  albuterol (PROVENTIL) (2.5 MG/3ML) 0.083% nebulizer solution Take 3 mLs (2.5 mg total) by nebulization every 6 (six) hours as needed for wheezing or shortness of breath. 11/05/17  Yes Emokpae, Courage, MD  apixaban (ELIQUIS) 5 MG TABS tablet Take 1 tablet (5 mg total) by mouth 2 (two) times daily. 08/19/19  Yes Bonnielee Haff, MD  gabapentin (NEURONTIN) 300 MG capsule Take 300 mg by mouth in the morning.  09/27/19  Yes [provider]  gabapentin (NEURONTIN) 600 MG tablet Take 600 mg by mouth at bedtime.   Yes [provider]  Infant Care Products (DERMACLOUD) CREA Apply 1 application topically in the morning and at bedtime.   Yes [provider]  lidocaine-prilocaine (EMLA) cream Apply 1 application topically as needed. Patient taking differently: Apply 1 application topically as needed (to numb port  site.).  02/28/19  Yes Volanda Napoleon, MD  metFORMIN (GLUCOPHAGE) 500 MG tablet Take 1 tablet (500 mg total) by mouth 2 (two) times daily with a meal. 02/13/19  Yes Philemon Kingdom, MD  metoCLOPramide (REGLAN) 5 MG tablet Take 1 tablet (5 mg total) by mouth every 8 (eight) hours as needed for nausea (before meals as needed for nausea.). 08/23/19  Yes Arrien, Jimmy Picket, MD  metoprolol tartrate (LOPRESSOR) 25 MG tablet Take 0.5 tablets (12.5 mg total) by mouth 2 (two) times daily. 08/14/19  Yes Swayze, Ava, DO  midodrine (PROAMATINE) 10 MG tablet Take 1 tablet (10 mg total) by mouth 3 (three) times daily with meals. Patient taking differently: Take 10 mg by mouth 3 (three) times daily.  09/27/19  Yes Mercy Riding, MD  Multiple Vitamins-Minerals (CENTRUM SILVER ADULT 50+) TABS Take 1 tablet once a day Patient taking differently: Take 1 tablet by mouth daily. Take 1 tablet once a day 02/20/19  Yes Lowne Lyndal Pulley R, DO  nitroGLYCERIN (NITROSTAT) 0.4 MG SL tablet Place 1 tablet (0.4 mg total) under the tongue every 5 (five) minutes as needed. Patient taking differently: Place 0.4 mg under the tongue every 5 (five) minutes as needed for chest pain.  01/14/19  Yes Lorretta Harp, MD  Nutritional Supplement LIQD Take 120 mLs by mouth 3 (three) times daily - between meals and at bedtime.   Yes [provider]  nystatin (MYCOSTATIN/NYSTOP) powder Apply topically 3 (three) times daily. Patient taking differently: Apply 1 application topically 2 (two) times daily.  08/14/19  Yes Swayze, Ava, DO  ondansetron (ZOFRAN) 4 MG tablet Take 1 tablet (4 mg total) by mouth every 6 (six) hours as needed for nausea. 09/27/19  Yes Mercy Riding, MD  oxybutynin (DITROPAN-XL) 5 MG 24 hr tablet Take 5 mg by mouth daily.   Yes [provider]  pantoprazole (PROTONIX) 40 MG tablet Take 1 tablet (40 mg total) by mouth daily. 07/16/19  Yes Ann Held, DO  promethazine (PHENERGAN) 25 MG/ML  injection Inject 25 mg into the vein every 6 (six) hours as needed for nausea or vomiting.   Yes [provider]  rosuvastatin (CRESTOR) 20 MG tablet Take 1 tablet (20 mg total) by mouth daily at 6 PM. Patient taking differently: Take 20 mg by mouth daily.  08/14/19  Yes Swayze, Ava, DO  sertraline (ZOLOFT) 50 MG tablet Take 1 tablet (50 mg total) by mouth daily. Patient taking differently: Take 75 mg by mouth daily.  02/20/19  Yes Ann Held, DO  traMADol (ULTRAM) 50 MG tablet Take 50 mg by mouth every 8 (eight) hours as needed for moderate pain.   Yes [provider]  traZODone (DESYREL) 50 MG tablet Take 0.5 tablets (25 mg total) by mouth at bedtime as needed for sleep. 08/14/19  Yes Swayze, Ava, DO  Triamcinolone Acetonide (TRIAMCINOLONE 0.1 % CREAM : EUCERIN) CREA Apply 1 application topically daily. To whole body   Yes [provider]  tuberculin 5 UNIT/0.1ML injection Inject 5 Units into the skin once a week.   Yes [provider]  glucose blood (ONETOUCH VERIO) test strip Use as instructed to check blood sugar 3 times a day Patient taking differently: 1 each by Other route See admin instructions. Use as instructed to check blood sugar 3 times a day 09/04/18   Philemon Kingdom, MD       Ina Homes, MD  IMTS PGY3  Pager: [phone]

## 2019-11-04 NOTE — Consult Note (Signed)
WOC Nurse Consult Note: Reason for Consult:progressive Moisture associated skin damage to gluteal folds and beneath breasts.  Continued anasarca and septic shock   Wound type:MASD from edema and incontinence. Purewick in place.  Pressure Injury POA: NA Measurement: 4 cm x 2 cm x 0.1 cm to each gluteal fold.  2 cm x 3.4 cm erythema beneath breasts.  Wound FJK:NIOC and moist Drainage (amount, consistency, odor) scant weeping Periwound:intact Dressing procedure/placement/frequency: Cleanse buttocks and beneath breasts with soap and water and pat dry.  Apply Gerhardts butt paste twice daily and PRN soilage. No disposable briefs or underpads.  Will not follow at this time.  Please re-consult if needed.  Domenic Moras MSN, RN, FNP-BC CWON Wound, Ostomy, Continence Nurse Pager [phone]

## 2019-11-04 NOTE — Progress Notes (Signed)
eLink Physician-Brief Progress Note Patient Name: [name] DOB: [birth year] MRN: [id number]   Date of Service  11/04/2019  HPI/Events of Note  ABG on 40%/PRVC 32/TV 470/P 5 = 7.276/22.7/53.0.   eICU Interventions  Will order: 1. Increase FiO2 to 60% and PEEP to 8.      Intervention Category Major Interventions: Acid-Base disturbance - evaluation and management;Respiratory failure - evaluation and management  [name] 11/04/2019, 10:20 PM

## 2019-11-04 NOTE — Progress Notes (Signed)
Acidotic on post intubation gas. MD paged. Awaiting return call.

## 2019-11-04 NOTE — Progress Notes (Signed)
Chaplain engaged in initial visit with [name] and her two daughters who were at bedside.  Chaplain explained her role and offered support to them.    Chaplain will also work to follow-up with [name]'s other siblings to offer them support and prayer as requested.

## 2019-11-04 NOTE — Progress Notes (Signed)
MD Tamala Julian okay to hold off on aline as of right now. RT will monitor.

## 2019-11-05 ENCOUNTER — Inpatient Hospital Stay (HOSPITAL_COMMUNITY): Payer: Medicare HMO

## 2019-11-05 ENCOUNTER — Other Ambulatory Visit: Payer: Self-pay

## 2019-11-05 DIAGNOSIS — E119 Type 2 diabetes mellitus without complications: Secondary | ICD-10-CM

## 2019-11-05 DIAGNOSIS — Z7189 Other specified counseling: Secondary | ICD-10-CM

## 2019-11-05 DIAGNOSIS — Z85028 Personal history of other malignant neoplasm of stomach: Secondary | ICD-10-CM

## 2019-11-05 DIAGNOSIS — Z515 Encounter for palliative care: Secondary | ICD-10-CM

## 2019-11-05 DIAGNOSIS — K7290 Hepatic failure, unspecified without coma: Secondary | ICD-10-CM

## 2019-11-05 LAB — CBC WITH DIFFERENTIAL/PLATELET
Abs Immature Granulocytes: 0.34 10*3/uL — ABNORMAL HIGH (ref 0.00–0.07)
Basophils Absolute: 0.1 10*3/uL (ref 0.0–0.1)
Basophils Relative: 0 %
Eosinophils Absolute: 0 10*3/uL (ref 0.0–0.5)
Eosinophils Relative: 0 %
HCT: 22.3 % — ABNORMAL LOW (ref 36.0–46.0)
Hemoglobin: 7.3 g/dL — ABNORMAL LOW (ref 12.0–15.0)
Immature Granulocytes: 2 %
Lymphocytes Relative: 8 %
Lymphs Abs: 1.5 10*3/uL (ref 0.7–4.0)
MCH: 30.7 pg (ref 26.0–34.0)
MCHC: 32.7 g/dL (ref 30.0–36.0)
MCV: 93.7 fL (ref 80.0–100.0)
Monocytes Absolute: 1.4 10*3/uL — ABNORMAL HIGH (ref 0.1–1.0)
Monocytes Relative: 8 %
Neutro Abs: 14.7 10*3/uL — ABNORMAL HIGH (ref 1.7–7.7)
Neutrophils Relative %: 82 %
Platelets: 82 10*3/uL — ABNORMAL LOW (ref 150–400)
RBC: 2.38 MIL/uL — ABNORMAL LOW (ref 3.87–5.11)
RDW: 17.3 % — ABNORMAL HIGH (ref 11.5–15.5)
WBC Morphology: INCREASED
WBC: 17.9 10*3/uL — ABNORMAL HIGH (ref 4.0–10.5)
nRBC: 0.3 % — ABNORMAL HIGH (ref 0.0–0.2)

## 2019-11-05 LAB — POCT I-STAT 7, (LYTES, BLD GAS, ICA,H+H)
Acid-base deficit: 14 mmol/L — ABNORMAL HIGH (ref 0.0–2.0)
Acid-base deficit: 13 mmol/L — ABNORMAL HIGH (ref 0.0–2.0)
Bicarbonate: 11.7 mmol/L — ABNORMAL LOW (ref 20.0–28.0)
Bicarbonate: 12.2 mmol/L — ABNORMAL LOW (ref 20.0–28.0)
Calcium, Ion: 0.96 mmol/L — ABNORMAL LOW (ref 1.15–1.40)
Calcium, Ion: 1 mmol/L — ABNORMAL LOW (ref 1.15–1.40)
HCT: 22 % — ABNORMAL LOW (ref 36.0–46.0)
HCT: 22 % — ABNORMAL LOW (ref 36.0–46.0)
Hemoglobin: 7.5 g/dL — ABNORMAL LOW (ref 12.0–15.0)
Hemoglobin: 7.5 g/dL — ABNORMAL LOW (ref 12.0–15.0)
O2 Saturation: 99 %
O2 Saturation: 99 %
Patient temperature: 36.1
Patient temperature: 36
Potassium: 4.4 mmol/L (ref 3.5–5.1)
Potassium: 4.3 mmol/L (ref 3.5–5.1)
Sodium: 141 mmol/L (ref 135–145)
Sodium: 141 mmol/L (ref 135–145)
TCO2: 12 mmol/L — ABNORMAL LOW (ref 22–32)
TCO2: 13 mmol/L — ABNORMAL LOW (ref 22–32)
pCO2 arterial: 23.3 mmHg — ABNORMAL LOW (ref 32.0–48.0)
pCO2 arterial: 23 mmHg — ABNORMAL LOW (ref 32.0–48.0)
pH, Arterial: 7.303 — ABNORMAL LOW (ref 7.350–7.450)
pH, Arterial: 7.327 — ABNORMAL LOW (ref 7.350–7.450)
pO2, Arterial: 135 mmHg — ABNORMAL HIGH (ref 83.0–108.0)
pO2, Arterial: 148 mmHg — ABNORMAL HIGH (ref 83.0–108.0)

## 2019-11-05 LAB — CBC
HCT: 21.3 % — ABNORMAL LOW (ref 36.0–46.0)
Hemoglobin: 6.8 g/dL — CL (ref 12.0–15.0)
MCH: 29.8 pg (ref 26.0–34.0)
MCHC: 31.9 g/dL (ref 30.0–36.0)
MCV: 93.4 fL (ref 80.0–100.0)
Platelets: 82 10*3/uL — ABNORMAL LOW (ref 150–400)
RBC: 2.28 MIL/uL — ABNORMAL LOW (ref 3.87–5.11)
RDW: 17.3 % — ABNORMAL HIGH (ref 11.5–15.5)
WBC: 18.4 10*3/uL — ABNORMAL HIGH (ref 4.0–10.5)
nRBC: 0.4 % — ABNORMAL HIGH (ref 0.0–0.2)

## 2019-11-05 LAB — COMPREHENSIVE METABOLIC PANEL
ALT: 23 U/L (ref 0–44)
AST: 149 U/L — ABNORMAL HIGH (ref 15–41)
Albumin: 1.7 g/dL — ABNORMAL LOW (ref 3.5–5.0)
Alkaline Phosphatase: 193 U/L — ABNORMAL HIGH (ref 38–126)
Anion gap: 19 — ABNORMAL HIGH (ref 5–15)
BUN: 16 mg/dL (ref 8–23)
CO2: 11 mmol/L — ABNORMAL LOW (ref 22–32)
Calcium: 6.7 mg/dL — ABNORMAL LOW (ref 8.9–10.3)
Chloride: 109 mmol/L (ref 98–111)
Creatinine, Ser: 2.99 mg/dL — ABNORMAL HIGH (ref 0.44–1.00)
GFR calc Af Amer: 18 mL/min — ABNORMAL LOW (ref >60)
GFR calc non Af Amer: 15 mL/min — ABNORMAL LOW (ref >60)
Glucose, Bld: 224 mg/dL — ABNORMAL HIGH (ref 70–99)
Potassium: 4.5 mmol/L (ref 3.5–5.1)
Sodium: 139 mmol/L (ref 135–145)
Total Bilirubin: 1.8 mg/dL — ABNORMAL HIGH (ref 0.3–1.2)
Total Protein: 4.6 g/dL — ABNORMAL LOW (ref 6.5–8.1)

## 2019-11-05 LAB — PHOSPHORUS: Phosphorus: 3.9 mg/dL (ref 2.5–4.6)

## 2019-11-05 LAB — GLUCOSE, CAPILLARY
Glucose-Capillary: 221 mg/dL — ABNORMAL HIGH (ref 70–99)
Glucose-Capillary: 239 mg/dL — ABNORMAL HIGH (ref 70–99)
Glucose-Capillary: 260 mg/dL — ABNORMAL HIGH (ref 70–99)
Glucose-Capillary: 233 mg/dL — ABNORMAL HIGH (ref 70–99)

## 2019-11-05 LAB — URINE CULTURE: Culture: NO GROWTH

## 2019-11-05 LAB — PREPARE RBC (CROSSMATCH)

## 2019-11-05 LAB — PROTIME-INR
INR: 5.6 (ref 0.8–1.2)
Prothrombin Time: 50.6 seconds — ABNORMAL HIGH (ref 11.4–15.2)

## 2019-11-05 LAB — MAGNESIUM
Magnesium: 2 mg/dL (ref 1.7–2.4)
Magnesium: 2 mg/dL (ref 1.7–2.4)

## 2019-11-05 MED ORDER — SODIUM CHLORIDE 0.9% IV SOLUTION: Freq: Once | INTRAVENOUS | Status: DC

## 2019-11-05 MED ORDER — ACETAMINOPHEN 650 MG RE SUPP: 650.0000 mg | Freq: Four times a day (QID) | RECTAL | Status: DC | PRN

## 2019-11-05 MED ORDER — POLYVINYL ALCOHOL 1.4 % OP SOLN
1.0000 [drp] | Freq: Four times a day (QID) | OPHTHALMIC | Status: DC | PRN
  Filled 2019-11-05: qty 15

## 2019-11-05 MED ORDER — HALOPERIDOL LACTATE 2 MG/ML PO CONC
0.5000 mg | ORAL | Status: DC | PRN
  Filled 2019-11-05: qty 0.3

## 2019-11-05 MED ORDER — CHLORHEXIDINE GLUCONATE 0.12% ORAL RINSE (MEDLINE KIT)
15.0000 mL | Freq: Two times a day (BID) | OROMUCOSAL | Status: DC
  Administered 2019-11-05 (×2): 15 mL via OROMUCOSAL

## 2019-11-05 MED ORDER — LORAZEPAM 2 MG/ML IJ SOLN
1.0000 mg | INTRAMUSCULAR | Status: DC | PRN
  Administered 2019-11-05: 2 mg via INTRAVENOUS
  Filled 2019-11-05: qty 1

## 2019-11-05 MED ORDER — ONDANSETRON 4 MG PO TBDP
4.0000 mg | ORAL_TABLET | Freq: Four times a day (QID) | ORAL | Status: DC | PRN
  Filled 2019-11-05: qty 1

## 2019-11-05 MED ORDER — FENTANYL BOLUS VIA INFUSION
50.0000 ug | INTRAVENOUS | Status: DC | PRN
  Filled 2019-11-05: qty 100

## 2019-11-05 MED ORDER — ONDANSETRON HCL 4 MG/2ML IJ SOLN: 4.0000 mg | Freq: Four times a day (QID) | INTRAMUSCULAR | Status: DC | PRN

## 2019-11-05 MED ORDER — GLYCOPYRROLATE 0.2 MG/ML IJ SOLN
0.2000 mg | INTRAMUSCULAR | Status: DC | PRN
  Administered 2019-11-05: 0.2 mg via INTRAVENOUS
  Filled 2019-11-05: qty 1

## 2019-11-05 MED ORDER — GLYCOPYRROLATE 1 MG PO TABS
1.0000 mg | ORAL_TABLET | ORAL | Status: DC | PRN
  Filled 2019-11-05: qty 1

## 2019-11-05 MED ORDER — VITAMIN K1 10 MG/ML IJ SOLN
5.0000 mg | Freq: Once | INTRAVENOUS | Status: DC
  Filled 2019-11-05: qty 0.5

## 2019-11-05 MED ORDER — CALCIUM GLUCONATE-NACL 1-0.675 GM/50ML-% IV SOLN
1.0000 g | Freq: Once | INTRAVENOUS | Status: AC
  Administered 2019-11-05: 1000 mg via INTRAVENOUS
  Filled 2019-11-05: qty 50

## 2019-11-05 MED ORDER — ORAL CARE MOUTH RINSE
15.0000 mL | OROMUCOSAL | Status: DC
  Administered 2019-11-05 (×4): 15 mL via OROMUCOSAL

## 2019-11-05 MED ORDER — HALOPERIDOL LACTATE 5 MG/ML IJ SOLN: 0.5000 mg | INTRAMUSCULAR | Status: DC | PRN

## 2019-11-05 MED ORDER — BIOTENE DRY MOUTH MT LIQD: 15.0000 mL | OROMUCOSAL | Status: DC | PRN

## 2019-11-05 MED ORDER — HALOPERIDOL 0.5 MG PO TABS
0.5000 mg | ORAL_TABLET | ORAL | Status: DC | PRN
  Filled 2019-11-05: qty 1

## 2019-11-05 MED ORDER — ACETAMINOPHEN 325 MG PO TABS: 650.0000 mg | ORAL_TABLET | Freq: Four times a day (QID) | ORAL | Status: DC | PRN

## 2019-11-05 MED ORDER — GLYCOPYRROLATE 0.2 MG/ML IJ SOLN: 0.2000 mg | INTRAMUSCULAR | Status: DC | PRN

## 2019-11-05 MED ORDER — VITAMIN K1 10 MG/ML IJ SOLN
10.0000 mg | Freq: Once | INTRAVENOUS | Status: DC
  Filled 2019-11-05: qty 1

## 2019-11-06 LAB — TYPE AND SCREEN
ABO/RH(D): O POS
Antibody Screen: NEGATIVE
Unit division: 0

## 2019-11-06 LAB — BPAM RBC
Blood Product Expiration Date: 202105172359
Unit Type and Rh: 5100

## 2019-11-06 LAB — MRSA PCR SCREENING: MRSA by PCR: NEGATIVE

## 2019-11-07 ENCOUNTER — Encounter: Payer: Self-pay | Admitting: Family Medicine

## 2019-11-08 LAB — CULTURE, BLOOD (ROUTINE X 2)
Culture: NO GROWTH
Culture: NO GROWTH
Special Requests: ADEQUATE

## 2019-11-11 IMAGING — MG DIGITAL SCREENING BILATERAL MAMMOGRAM WITH TOMO AND CAD
6 of 12 series · 6 of 36 positions shown · non-contrast
Comparison: Previous exam(s).

CLINICAL DATA: Screening.

EXAM:
DIGITAL SCREENING BILATERAL MAMMOGRAM WITH TOMO AND CAD

[R MLO synth-2D]
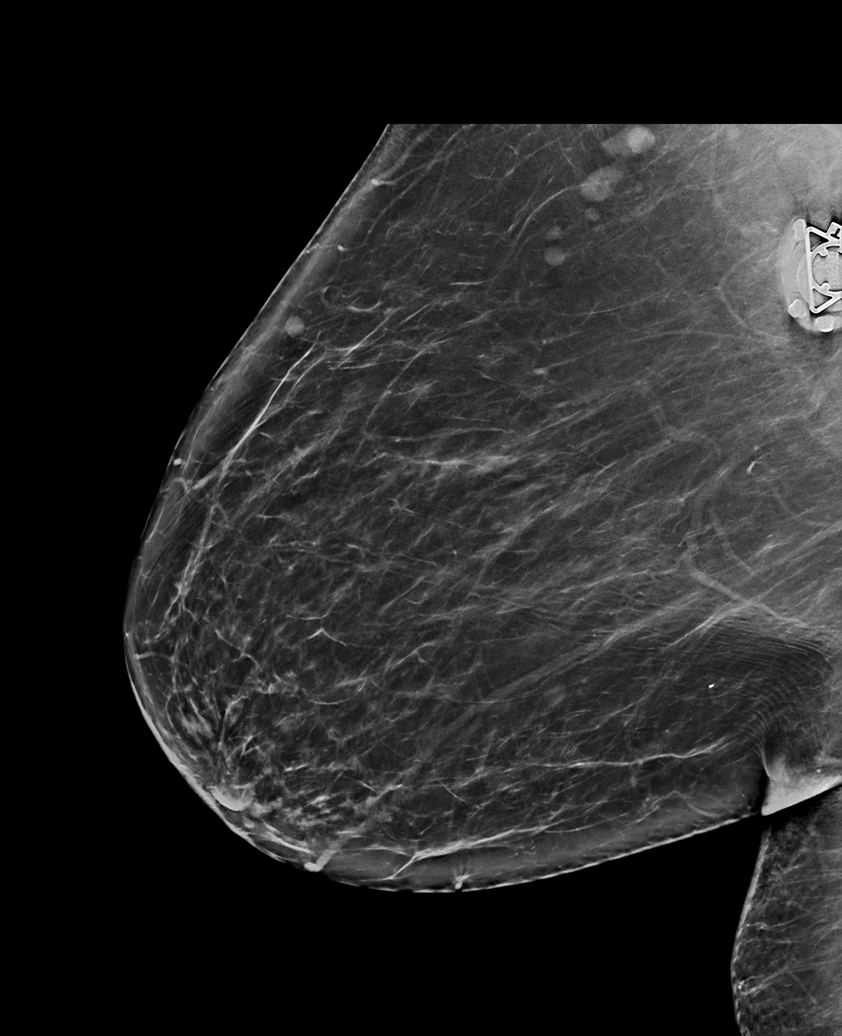

[L CC synth-2D]
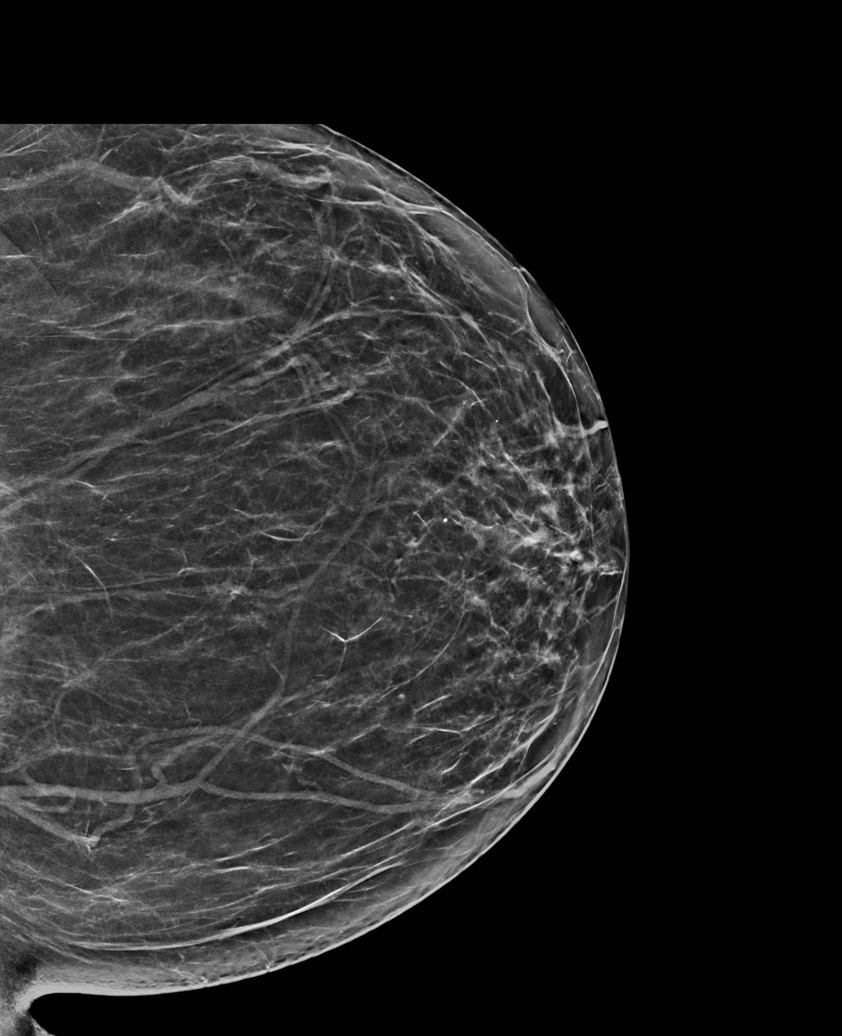

[L MLO synth-2D (1 of 2)]
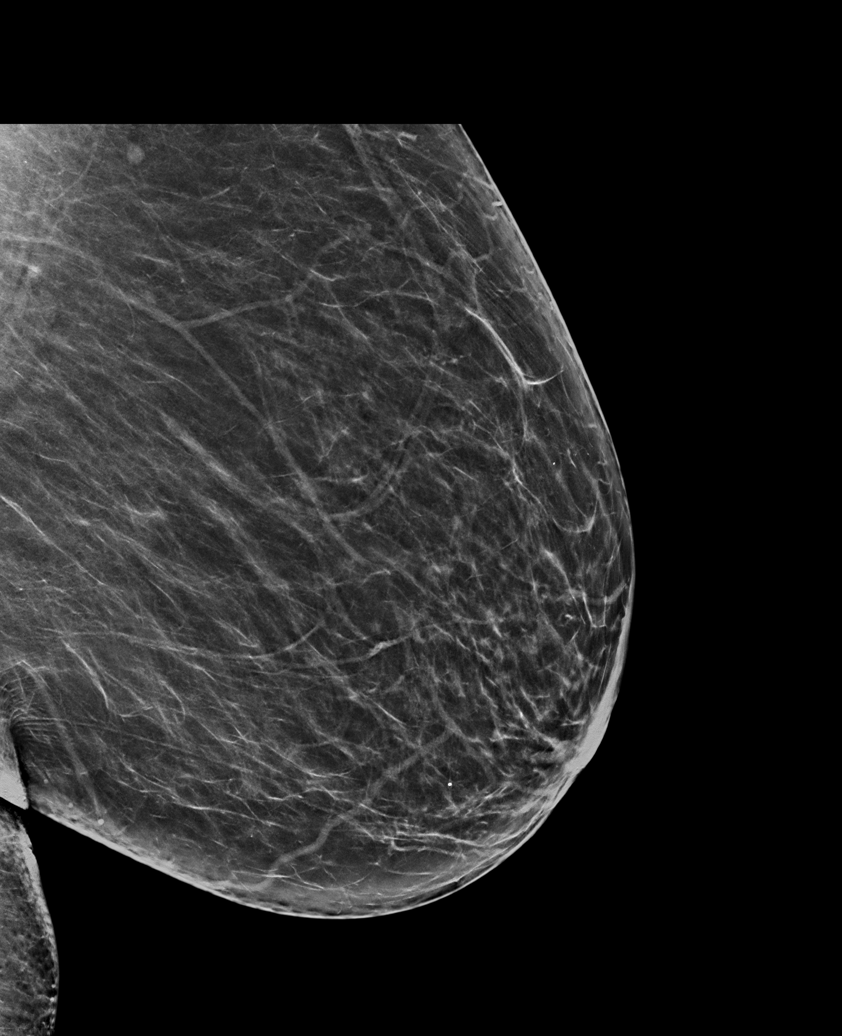

[L MLO synth-2D (2 of 2)]
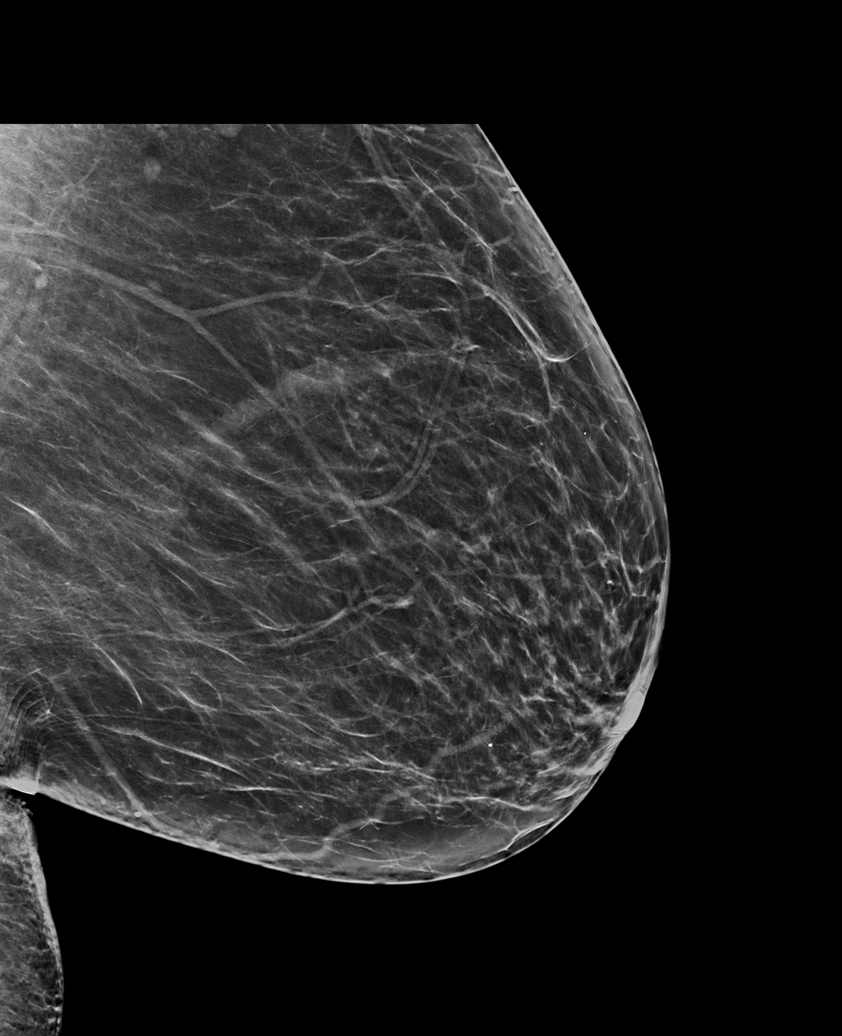

[R CV synth-2D]
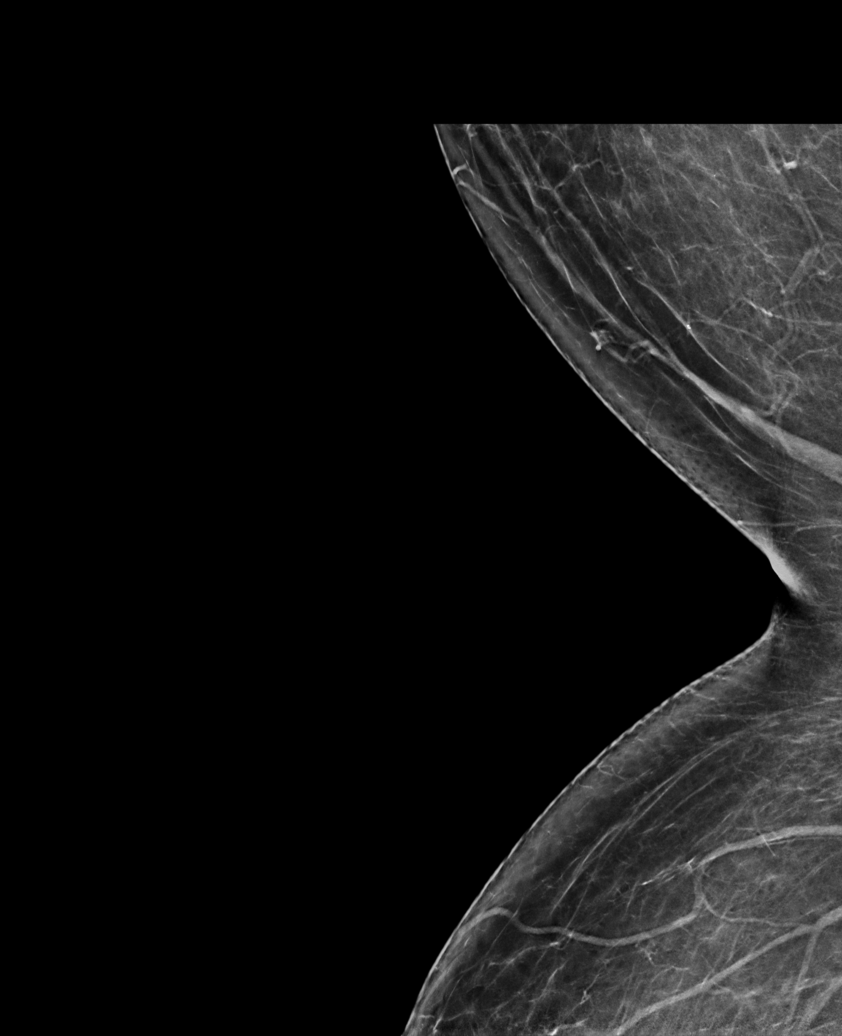

[R CC synth-2D]
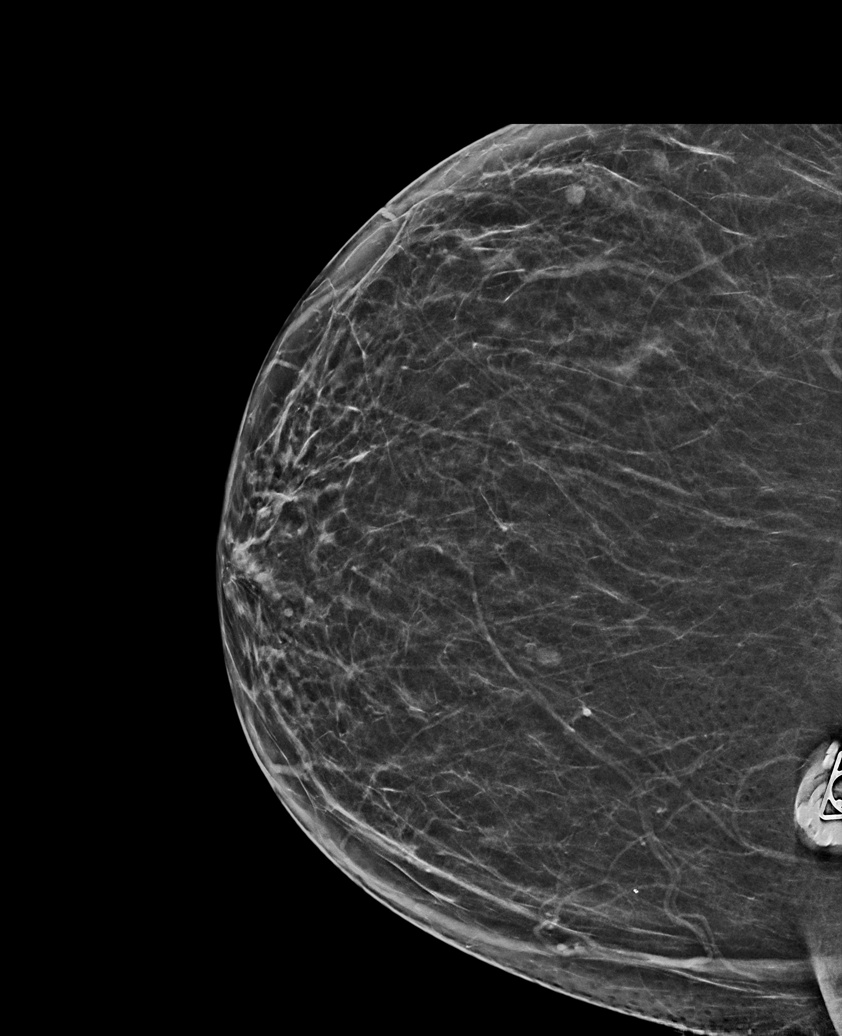

[6 of 36 positions shown; findings below may reference images not displayed]

ACR Breast Density Category b: There are scattered areas of
fibroglandular density.
FINDINGS: There are no findings suspicious for malignancy. Images were
processed with CAD.
IMPRESSION: No mammographic evidence of malignancy. A result letter of this
screening mammogram will be mailed directly to the patient.

RECOMMENDATION:
Screening mammogram in one year. (Code:[id number])

BI-RADS CATEGORY  1: Negative.

## 2019-11-23 NOTE — Consult Note (Signed)
Consultation Note Date: 11-29-19   Patient Name: [name]  DOB: [birth year]  MRN: [id number]  Age / Sex: 69 y.o., female  PCP: Carollee Herter, Alferd Apa, DO Referring Physician: Candee Furbish, MD  Reason for Consultation: Establishing goals of care and Withdrawal of life-sustaining treatment  HPI/Patient Profile: 69 y.o. female  admitted on 11/11/2019 with septic shock and multi-organ failure. Past medical history of asthma, DJD, DM, GERD, sleep apnea, DVT, and a remote history of stomach cancer. She has had 8 hospitalizations in the last 6 months, including recurrent hospitalizations for UTI. She was positive for Covid-19 in December 2020 and since then has had further decline in her health status.   Clinical Assessment and Goals of Care: This NP Elie Confer reviewed medical records, received report from team, assessed the patient and then met at the patient's bedside along with patient's daughters to discuss diagnosis, prognosis, GOC, EOL wishes, and options.  Patient is currently intubated, sedated, on vasopressors, and minimally responsive to stimuli. We discussed the difference between an aggressive medical intervention path and a palliative comfort care path for this patient.  Values and goals of care important to patient and family were attempted to be elicited.  Patient's daughters verbalized that their mother would not want aggressive interventions at this point and they would like to transition her to comfort care.  We discussed the process of one-way extubation and discontinuing vasopressors. We emphasized that medications will be given as needed to keep the patient comfortable. Questions and concerns were addressed.     PMT will continue to support holistically.  Patient's daughters are the primary decision makers and all are in agreement regarding plan of care.     SUMMARY OF  RECOMMENDATIONS   One-way extubation and transition to comfort care.  Code Status/Advance Care Planning:  DNR   Symptom Management:   Per comfort care order set  Continue fentanyl infusion for pain/shortness of breath management with boluses prn  Palliative Prophylaxis:   Frequent Pain Assessment  Additional Recommendations (Limitations, Scope, Preferences):  Full Comfort Care  Psycho-social/Spiritual:   Desire for further Chaplaincy support:yes  Additional Recommendations: Grief/Bereavement Support  Prognosis:   Likely hours  Discharge Planning: Anticipated Hospital Death      Primary Diagnoses: Present on Admission: **None**   I have reviewed the medical record, interviewed the patient and family, and examined the patient. The following aspects are pertinent.  Past Medical History:  Diagnosis Date  . Asthma   . Degenerative joint disease   . Diabetes mellitus   . Dyspnea   . GERD (gastroesophageal reflux disease)   . Hyperlipidemia    no longer on medication for this  . Hypertension    patient denies  . Myocardial infarction ([place]) 08/2018  . Neuropathy    bilateral hands and feet  . Sickle cell trait ([place])   . Sleep apnea   . stomach ca dx'd 2010   chemo/xrt comp 05/2009  . TIA (transient ischemic attack) 07/2015   Social History  Socioeconomic History  . Marital status: Divorced    Spouse name: Not on file  . Number of children: 4  . Years of education: Not on file  . Highest education level: High school graduate  Occupational History  . Occupation: Retired Product/process development scientist: [employer]  . Smoking status: Former Smoker    Packs/day: 1.00    Years: 15.00    Pack years: 15.00    Types: Cigarettes    Start date: 09/27/1973    Quit date: 10/15/1988    Years since quitting: 31.0  . Smokeless tobacco: Never Used  . Tobacco comment: quit smoking 14 years ago  Substance and Sexual Activity  . Alcohol use: No     Alcohol/week: 0.0 standard drinks  . Drug use: No  . Sexual activity: Not on file  Other Topics Concern  . Not on file  Social History Narrative   Lives alone, 4 children live near her. She would like to move to an independent retirement community   Social Determinants of Radio broadcast assistant Strain: Low Risk   . Difficulty of Paying Living Expenses: Not very hard  Food Insecurity: No Food Insecurity  . Worried About Charity fundraiser in the Last Year: Never true  . Ran Out of Food in the Last Year: Never true  Transportation Needs: No Transportation Needs  . Lack of Transportation (Medical): No  . Lack of Transportation (Non-Medical): No  Physical Activity: Insufficiently Active  . Days of Exercise per Week: 3 days  . Minutes of Exercise per Session: 30 min  Stress: No Stress Concern Present  . Feeling of Stress : Not at all  Social Connections: Somewhat Isolated  . Frequency of Communication with Friends and Family: More than three times a week  . Frequency of Social Gatherings with Friends and Family: More than three times a week  . Attends Religious Services: More than 4 times per year  . Active Member of Clubs or Organizations: No  . Attends Archivist Meetings: Never  . Marital Status: Divorced   Family History  Problem Relation Age of Onset  . Diabetes Brother   . Alzheimer's disease Father 79  . Cancer Mother 40  . Diabetes Maternal Grandmother   . Cancer Maternal Grandfather        lung  . Healthy Child   . Suicidality Neg Hx   . Depression Neg Hx   . Dementia Neg Hx   . Anxiety disorder Neg Hx    Scheduled Meds: . sodium chloride   Intravenous Once  . chlorhexidine gluconate (MEDLINE KIT)  15 mL Mouth Rinse BID  . Chlorhexidine Gluconate Cloth  6 each Topical Daily  . Gerhardt's butt cream   Topical BID  . mouth rinse  15 mL Mouth Rinse 10 times per day  . sodium chloride flush  10-40 mL Intracatheter Q12H   Continuous Infusions: .  sodium chloride Stopped (11/04/19 1225)  . fentaNYL infusion INTRAVENOUS Stopped (11/16/19 0730)  . phytonadione (VITAMIN K) IV     PRN Meds:.acetaminophen **OR** acetaminophen, albuterol, antiseptic oral rinse, fentaNYL, glycopyrrolate **OR** glycopyrrolate **OR** glycopyrrolate, haloperidol **OR** haloperidol **OR** haloperidol lactate, LORazepam, ondansetron **OR** ondansetron (ZOFRAN) IV, polyvinyl alcohol, sodium chloride flush Medications Prior to Admission:  Prior to Admission medications   Medication Sig Start Date End Date Taking? Authorizing Provider  acetaminophen (TYLENOL) 500 MG tablet Take 2 tablets (1,000 mg total) by mouth every 8 (eight) hours. Patient taking differently:  Take 1,000 mg by mouth every 8 (eight) hours as needed for moderate pain.  09/27/19 11/26/19 Yes Mercy Riding, MD  albuterol (PROVENTIL HFA;VENTOLIN HFA) 108 (90 Base) MCG/ACT inhaler Inhale 2 puffs into the lungs every 6 (six) hours as needed for wheezing or shortness of breath. 11/05/17  Yes Emokpae, Courage, MD  albuterol (PROVENTIL) (2.5 MG/3ML) 0.083% nebulizer solution Take 3 mLs (2.5 mg total) by nebulization every 6 (six) hours as needed for wheezing or shortness of breath. 11/05/17  Yes Emokpae, Courage, MD  apixaban (ELIQUIS) 5 MG TABS tablet Take 1 tablet (5 mg total) by mouth 2 (two) times daily. 08/19/19  Yes Bonnielee Haff, MD  gabapentin (NEURONTIN) 300 MG capsule Take 300 mg by mouth in the morning.  09/27/19  Yes [provider]  gabapentin (NEURONTIN) 600 MG tablet Take 600 mg by mouth at bedtime.   Yes [provider]  Infant Care Products (DERMACLOUD) CREA Apply 1 application topically in the morning and at bedtime.   Yes [provider]  lidocaine-prilocaine (EMLA) cream Apply 1 application topically as needed. Patient taking differently: Apply 1 application topically as needed (to numb port site.).  02/28/19  Yes Volanda Napoleon, MD  metFORMIN (GLUCOPHAGE) 500 MG tablet  Take 1 tablet (500 mg total) by mouth 2 (two) times daily with a meal. 02/13/19  Yes Philemon Kingdom, MD  metoCLOPramide (REGLAN) 5 MG tablet Take 1 tablet (5 mg total) by mouth every 8 (eight) hours as needed for nausea (before meals as needed for nausea.). 08/23/19  Yes Arrien, Jimmy Picket, MD  metoprolol tartrate (LOPRESSOR) 25 MG tablet Take 0.5 tablets (12.5 mg total) by mouth 2 (two) times daily. 08/14/19  Yes Swayze, Ava, DO  midodrine (PROAMATINE) 10 MG tablet Take 1 tablet (10 mg total) by mouth 3 (three) times daily with meals. Patient taking differently: Take 10 mg by mouth 3 (three) times daily.  09/27/19  Yes Mercy Riding, MD  Multiple Vitamins-Minerals (CENTRUM SILVER ADULT 50+) TABS Take 1 tablet once a day Patient taking differently: Take 1 tablet by mouth daily. Take 1 tablet once a day 02/20/19  Yes Lowne Lyndal Pulley R, DO  nitroGLYCERIN (NITROSTAT) 0.4 MG SL tablet Place 1 tablet (0.4 mg total) under the tongue every 5 (five) minutes as needed. Patient taking differently: Place 0.4 mg under the tongue every 5 (five) minutes as needed for chest pain.  01/14/19  Yes Lorretta Harp, MD  Nutritional Supplement LIQD Take 120 mLs by mouth 3 (three) times daily - between meals and at bedtime.   Yes [provider]  nystatin (MYCOSTATIN/NYSTOP) powder Apply topically 3 (three) times daily. Patient taking differently: Apply 1 application topically 2 (two) times daily.  08/14/19  Yes Swayze, Ava, DO  ondansetron (ZOFRAN) 4 MG tablet Take 1 tablet (4 mg total) by mouth every 6 (six) hours as needed for nausea. 09/27/19  Yes Mercy Riding, MD  oxybutynin (DITROPAN-XL) 5 MG 24 hr tablet Take 5 mg by mouth daily.   Yes [provider]  pantoprazole (PROTONIX) 40 MG tablet Take 1 tablet (40 mg total) by mouth daily. 07/16/19  Yes Ann Held, DO  promethazine (PHENERGAN) 25 MG/ML injection Inject 25 mg into the vein every 6 (six) hours as needed for nausea or  vomiting.   Yes [provider]  rosuvastatin (CRESTOR) 20 MG tablet Take 1 tablet (20 mg total) by mouth daily at 6 PM. Patient taking differently: Take 20 mg by  mouth daily.  08/14/19  Yes Swayze, Ava, DO  sertraline (ZOLOFT) 50 MG tablet Take 1 tablet (50 mg total) by mouth daily. Patient taking differently: Take 75 mg by mouth daily.  02/20/19  Yes Ann Held, DO  traMADol (ULTRAM) 50 MG tablet Take 50 mg by mouth every 8 (eight) hours as needed for moderate pain.   Yes [provider]  traZODone (DESYREL) 50 MG tablet Take 0.5 tablets (25 mg total) by mouth at bedtime as needed for sleep. 08/14/19  Yes Swayze, Ava, DO  Triamcinolone Acetonide (TRIAMCINOLONE 0.1 % CREAM : EUCERIN) CREA Apply 1 application topically daily. To whole body   Yes [provider]  tuberculin 5 UNIT/0.1ML injection Inject 5 Units into the skin once a week.   Yes [provider]  glucose blood (ONETOUCH VERIO) test strip Use as instructed to check blood sugar 3 times a day Patient taking differently: 1 each by Other route See admin instructions. Use as instructed to check blood sugar 3 times a day 09/04/18   Philemon Kingdom, MD   Allergies  Allergen Reactions  . Cephalexin Itching, Rash and Other (See Comments)    Sensation of throat closing. Tolerated pip/tazo, cefepime, ceftriaxone before   . Adhesive [Tape] Other (See Comments)    Burn Skin  . Codeine Other (See Comments)    paranoid  . Zocor [Simvastatin - High Dose] Other (See Comments)    Muscle spasms   Review of Systems  Unable to perform ROS: Intubated    Physical Exam Constitutional:      Interventions: She is sedated and intubated.  HENT:     Head: Normocephalic and atraumatic.  Cardiovascular:     Comments: Fluctuating HR with periods of bradycardia and PVCs Pulmonary:     Effort: She is intubated.  Musculoskeletal:     Right lower leg: 2+ Pitting Edema present.     Left lower leg: 2+ Pitting  Edema present.  Skin:    General: Skin is warm and dry.  Neurological:     GCS: GCS eye subscore is 1. GCS verbal subscore is 1. GCS motor subscore is 1.     Vital Signs: BP (!) 102/51   Pulse (!) 107   Temp (!) 97.3 F (36.3 C)   Resp (!) 21   Wt 102.3 kg   SpO2 100%   BMI 36.40 kg/m  Pain Scale: CPOT   Pain Score: 2    SpO2: SpO2: 100 % O2 Device:SpO2: 100 % O2 Flow Rate: .O2 Flow Rate (L/min): 60 L/min  IO: Intake/output summary:   Intake/Output Summary (Last 24 hours) at 2019/11/25 1436 Last data filed at Nov 25, 2019 1100 Gross per 24 hour  Intake 4717.48 ml  Output 340 ml  Net 4377.48 ml    LBM: Last BM Date: 11/06/2019 Baseline Weight: Weight: 102.3 kg Most recent weight: Weight: 102.3 kg     Palliative Assessment/Data: 10%     Time In: 13:30 Time Out: 14:40 Time Total: 70 minutes Greater than 50%  of this time was spent counseling and coordinating care related to the above assessment and plan.  Signed by: Lavena Bullion, NP   Please contact Palliative Medicine Team phone at [phone] for questions and concerns.  For individual provider: See Malachi Paradise, NP Palliative Medicine Team Pager [phone] (Please see [email or web address] for schedule) Team Phone [phone]

## 2019-11-23 NOTE — Progress Notes (Signed)
CRITICAL VALUE ALERT  Critical Value:  Hgb 6.8; INR 5.6  Date & Time Notied:  11-16-2019 @ 1000  Provider Notified: Dr. Tamala Julian  Orders Received/Actions taken: Awaiting orders to be placed

## 2019-11-23 NOTE — Progress Notes (Signed)
NAME:  [name], MRN:  [id number], DOB:  [birth year], LOS: 2 ADMISSION DATE:  11/18/2019, CONSULTATION DATE:  11/18/2019 REFERRING MD:  EDP, CHIEF COMPLAINT:  Septic Shock   Brief History   [name] is a 69 y.o female with a hx of COVID-19 PNA, chronic hypotension on midodrine, and multiple hospitalizations for recurrent UTIs who presented to the ED with encephalopathy. She was found to be hypotensive with acute on chronic renal failure and lactic acidosis. She received volume resuscitation with 30cc/kg but remained hypotensive requiring Levophed. We were subsequently consulted for for further evaluation/management of her septic shock.   Past Medical History   Past Medical History:  Diagnosis Date  . Asthma   . Degenerative joint disease   . Diabetes mellitus   . Dyspnea   . GERD (gastroesophageal reflux disease)   . Hyperlipidemia    no longer on medication for this  . Hypertension    patient denies  . Myocardial infarction ([place]) 08/2018  . Neuropathy    bilateral hands and feet  . Sickle cell trait ([place])   . Sleep apnea   . stomach ca dx'd 2010   chemo/xrt comp 05/2009  . TIA (transient ischemic attack) 07/2015   Significant Hospital Events   4/11: Admitted with septic shock   Consults:  None  Procedures:  4/12: ETT 4/12: Left IJ CVC  4/12: Left radial A-line  Significant Diagnostic Tests:  4/11 CT Abdomen / Pelvis without Contrast >>  1. Compared with the prior study from 10 days ago, worsening anasarca with increased edema throughout the soft tissues and increased pelvic ascites. Enlarging moderate left and small right dependent pleural effusions. 2. Underlying patchy ground-glass opacities at both lung bases, likely a combination of atelectasis and inflammation in this patient with history of COVID-19 infection. 3. Nonspecific bowel wall thickening, likely related to anasarca 4. Persistent gallbladder distention without calcified gallstones or focal  surrounding inflammation. 5. Diffuse hepatic steatosis. 6. Aortic Atherosclerosis (ICD10-I70.0).  Micro Data:  4/11 Blood cultures >> NGTD 4/11 Urine cultures >> pending  Antimicrobials:  Linezolid 4/11 >> Meropenem 4/11 >>  Interim history/subjective:  Worsening acidosis.  Remains anuric  Sedated  Objective   Blood pressure 113/69, pulse 96, temperature (!) 96.8 F (36 C), resp. rate 17, weight 102.3 kg, SpO2 100 %. CVP:  [5 mmHg-15 mmHg] 7 mmHg  Vent Mode: PRVC FiO2 (%):  [40 %-100 %] 60 % Set Rate:  [22 bmp-32 bmp] 32 bmp Vt Set:  [470 mL] 470 mL PEEP:  [5 cmH20-8 cmH20] 8 cmH20 Plateau Pressure:  [12 cmH20] 12 cmH20   Intake/Output Summary (Last 24 hours) at 11/29/19 0536 Last data filed at 11/29/2019 0300 Gross per 24 hour  Intake 3688.99 ml  Output 55 ml  Net 3633.99 ml   Filed Weights   11/29/2019 0500  Weight: 102.3 kg    Examination: General: Obese female, grimace to pain HENT: ETT in place Pulm: Good air movement with no wheezing or crackles  CV: Tachycardic but regular rhythm, no murmurs, no rubs  Abdomen: Active bowel sounds, soft, non-distended, no tenderness to palpation  Extremities: Pulses palpable in all extremities, 2+ pitting LE edema  Skin: Warm and dry  Neuro: Sedated   Resolved Hospital Problem list   None  Assessment & Plan:   Septic shock ESBL E. coli history, vancomycin resistant Enterococcus history - Remains acidotic  -- Remains on Levo 55 mcg and fixed- dose Vasopressin for MAP support. Wean as tolerated for MAP >  65.  - Continue meropenem and linezolid. Will narrow based on cultures.   Acute renal failure -- Likely ATN -- Worsening acidosis and hypervolemia  -- Monitor UOP and avoid nephrotoxic medications.   Anasarca Bilateral extremity edema Chronic diastolic heart failure Mildly elevated pulmonary artery systolic pressure on previous echocardiogram Hypoalbuminemia - Repeat echocardiogram with diastolic dysfunction   - Expect will need diuresis  Coagulopathy On apixaban PTA History of DVT - No intervention at this time, no evidence of bleeding  Anemia of chronic disease - Continue to monitor   Low platelets, thrombocytopenia - Continue to monitor   GOC: Will discuss trajectory with family. Spoke with Dr. Marin Olp at bedside. He will also communicate the prognosis with family.   Best practice:  Diet: soft Pain/Anxiety/Delirium protocol (if indicated): none  VAP protocol (if indicated): na DVT prophylaxis: heparin  GI prophylaxis: H2B Glucose control: SSI Mobility: bed Code Status: FULL Family Communication: discssed with daughter Disposition: ICU   Labs   CBC: Recent Labs  Lab 10/31/2019 1208 10/28/2019 1208 11/21/2019 1802 11/01/2019 1802 11/07/2019 2022 11/17/2019 2022 11/04/19 0147 11/04/19 0626 11/04/19 1311 11/04/19 1752 11/04/19 2154 11-25-19 0340 11-25-19 0350  WBC 14.9*  --  14.9*  --  15.0*  --  15.9*  --   --   --   --  17.9*  --   NEUTROABS 10.5*  --  12.6*  --  12.6*  --   --   --   --   --   --  14.7*  --   HGB 9.0*   < > 9.9*   < > 8.7*   < > 9.4*   < > 9.2* 8.8* 7.5* 7.3* 7.5*  HCT 26.8*   < > 29.2*   < > 27.0*   < > 28.4*   < > 27.0* 26.0* 22.0* 22.3* 22.0*  MCV 89.0  --  88.8  --  91.5  --  90.7  --   --   --   --  93.7  --   PLT 122*  --  PLATELET CLUMPS NOTED ON SMEAR, UNABLE TO ESTIMATE  --  116*  --  108*  --   --   --   --  82*  --    < > = values in this interval not displayed.    Basic Metabolic Panel: Recent Labs  Lab 10/30/19 0436 10/26/2019 1208 11/10/2019 1802 11/08/2019 1802 11/20/2019 2022 10/31/2019 2022 11/04/19 0147 11/04/19 0626 11/04/19 1444 11/04/19 1752 11/04/19 2154 11-25-19 0340 11-25-2019 0350  NA  --    < > 134*   < > 137   < > 142   < > 139 141 140 139 141  K  --    < > 6.4*   < > 4.8   < > 5.1   < > 4.9 5.0 4.6 4.5 4.4  CL  --    < > 107  --  106  --  113*  --  110  --   --  109  --   CO2  --    < > 14*  --  15*  --  14*  --  10*  --    --  11*  --   GLUCOSE  --    < > 84  --  107*  --  117*  --  145*  --   --  224*  --   BUN  --    < > 15  --  17  --  15  --  17  --   --  16  --   CREATININE  --    < > 2.46*  --  2.48*  --  2.54*  --  2.66*  --   --  2.99*  --   CALCIUM  --    < > 7.1*  --  7.3*  --  7.1*  --  6.5*  --   --  6.7*  --   MG 2.1  --   --   --  2.2  --  2.1  --   --   --   --  2.0  --   PHOS  --   --   --   --  3.8  --  4.0  --   --   --   --   --   --    < > = values in this interval not displayed.   GFR: Estimated Creatinine Clearance: 22 mL/min (A) (by C-G formula based on SCr of 2.99 mg/dL (H)). Recent Labs  Lab 10/27/2019 1208 11/10/2019 1445 11/06/2019 1802 11/17/2019 2022 11/04/19 0147 11/04/19 0455 11/04/19 0645 11/04/19 0906 Nov 09, 2019 0340  PROCALCITON  --   --   --  1.40  --   --   --   --   --   WBC   < >  --  14.9* 15.0* 15.9*  --   --   --  17.9*  LATICACIDVEN  --  2.8*  --   --   --  5.4* 5.3* 5.5*  --    < > = values in this interval not displayed.    Liver Function Tests: Recent Labs  Lab 11/02/2019 1208 10/28/2019 1802 11/04/19 0949 11-09-2019 0340  AST 122* 141* 136* 149*  ALT 28 28 30 23   ALKPHOS 219* 244* 224* 193*  BILITOT 1.4* 1.5* 1.7* 1.8*  PROT 4.4* 5.4* 5.0* 4.6*  ALBUMIN 1.0* 1.6* 1.6* 1.7*   Recent Labs  Lab 11/08/2019 1208  LIPASE 11   Recent Labs  Lab 11/04/19 0906  AMMONIA 163*    ABG    Component Value Date/Time   PHART 7.303 (L) 11/09/19 0350   PCO2ART 23.3 (L) 09-Nov-2019 0350   PO2ART 135.0 (H) Nov 09, 2019 0350   HCO3 11.7 (L) 09-Nov-2019 0350   TCO2 12 (L) November 09, 2019 0350   ACIDBASEDEF 14.0 (H) 11-09-2019 0350   O2SAT 99.0 11-09-2019 0350     Coagulation Profile: Recent Labs  Lab 11/16/2019 1208 11/10/2019 2359  INR 3.3* 3.6*    Cardiac Enzymes: Recent Labs  Lab 11/04/19 0147  CKTOTAL 830*    HbA1C: Hemoglobin A1c  Date/Time Value Ref Range Status  06/27/2016 11:43 AM 7.1 (H) 4.8 - 5.6 % Final    Comment:             Pre-diabetes: 5.7 -  6.4          Diabetes: >6.4          Glycemic control for adults with diabetes: <7.0    Hgb A1c MFr Bld  Date/Time Value Ref Range Status  09/27/2019 08:10 AM 5.6 4.8 - 5.6 % Final    Comment:    (NOTE) Pre diabetes:          5.7%-6.4% Diabetes:              >6.4% Glycemic control for   <7.0% adults with diabetes   07/27/2019 02:40 AM 8.7 (H) 4.8 - 5.6 %  Final    Comment:    (NOTE) Pre diabetes:          5.7%-6.4% Diabetes:              >6.4% Glycemic control for   <7.0% adults with diabetes     CBG: Recent Labs  Lab 11/04/19 1139 11/04/19 1530 11/04/19 2015 11/04/19 2343 2019/12/03 0347  GLUCAP 98 152* 178* 232* 221*    Review of Systems:   12 point ROS preformed. All negative aside from those mentioned in the HPI.  Past Medical History  She,  has a past medical history of Asthma, Degenerative joint disease, Diabetes mellitus, Dyspnea, GERD (gastroesophageal reflux disease), Hyperlipidemia, Hypertension, Myocardial infarction ([place]) (08/2018), Neuropathy, Sickle cell trait ([place]), Sleep apnea, stomach ca (dx'd 2010), and TIA (transient ischemic attack) (07/2015).   Surgical History    Past Surgical History:  Procedure Laterality Date  . APPENDECTOMY    . CESAREAN SECTION    . COLON SURGERY     colonscopy  . COLONOSCOPY WITH PROPOFOL N/A 06/01/2016   Procedure: COLONOSCOPY WITH PROPOFOL;  Surgeon: Wilford Corner, MD;  Location: [facility] ENDOSCOPY;  Service: Endoscopy;  Laterality: N/A;  . CORONARY STENT INTERVENTION N/A 08/29/2018   Procedure: CORONARY STENT INTERVENTION;  Surgeon: Wellington Hampshire, MD;  Location: [place] CV LAB;  Service: Cardiovascular;  Laterality: N/A;  . CORONARY/GRAFT ACUTE MI REVASCULARIZATION N/A 08/27/2018   Procedure: Coronary/Graft Acute MI Revascularization;  Surgeon: Leonie Man, MD;  Location: [place] CV LAB;  Service: Cardiovascular;  Laterality: N/A;  . ESOPHAGOGASTRODUODENOSCOPY N/A 10/15/2012   Procedure:  ESOPHAGOGASTRODUODENOSCOPY (EGD);  Surgeon: Lear Ng, MD;  Location: [facility] ENDOSCOPY;  Service: Endoscopy;  Laterality: N/A;  . ESOPHAGOGASTRODUODENOSCOPY N/A 01/15/2016   Procedure: ESOPHAGOGASTRODUODENOSCOPY (EGD);  Surgeon: Ronald Lobo, MD;  Location: [facility] ENDOSCOPY;  Service: Endoscopy;  Laterality: N/A;  . ESOPHAGOGASTRODUODENOSCOPY (EGD) WITH PROPOFOL N/A 09/04/2019   Procedure: ESOPHAGOGASTRODUODENOSCOPY (EGD) WITH PROPOFOL;  Surgeon: Ronald Lobo, MD;  Location: [place];  Service: Endoscopy;  Laterality: N/A;  possible esophageal dilatation  . HERNIA REPAIR    . LEFT HEART CATH AND CORONARY ANGIOGRAPHY N/A 08/27/2018   Procedure: LEFT HEART CATH AND CORONARY ANGIOGRAPHY;  Surgeon: Leonie Man, MD;  Location: [place] CV LAB;  Service: Cardiovascular;  Laterality: N/A;  . SHOULDER SURGERY     Left  . TOTAL HIP ARTHROPLASTY     Right  . TOTAL KNEE ARTHROPLASTY     Left     Social History   reports that she quit smoking about 31 years ago. Her smoking use included cigarettes. She started smoking about 46 years ago. She has a 15.00 pack-year smoking history. She has never used smokeless tobacco. She reports that she does not drink alcohol or use drugs.   Family History   Her family history includes Alzheimer's disease (age of onset: 64) in her father; Cancer in her maternal grandfather; Cancer (age of onset: 62) in her mother; Diabetes in her brother and maternal grandmother; Healthy in her child. There is no history of Suicidality, Depression, Dementia, or Anxiety disorder.   Allergies Allergies  Allergen Reactions  . Cephalexin Itching, Rash and Other (See Comments)    Sensation of throat closing. Tolerated pip/tazo, cefepime, ceftriaxone before   . Adhesive [Tape] Other (See Comments)    Burn Skin  . Codeine Other (See Comments)    paranoid  . Zocor [Simvastatin - High Dose] Other (See Comments)    Muscle spasms  Home Medications  Prior to Admission  medications   Medication Sig Start Date End Date Taking? Authorizing Provider  acetaminophen (TYLENOL) 500 MG tablet Take 2 tablets (1,000 mg total) by mouth every 8 (eight) hours. Patient taking differently: Take 1,000 mg by mouth every 8 (eight) hours as needed for moderate pain.  09/27/19 11/26/19 Yes Mercy Riding, MD  albuterol (PROVENTIL HFA;VENTOLIN HFA) 108 (90 Base) MCG/ACT inhaler Inhale 2 puffs into the lungs every 6 (six) hours as needed for wheezing or shortness of breath. 11/05/17  Yes Emokpae, Courage, MD  albuterol (PROVENTIL) (2.5 MG/3ML) 0.083% nebulizer solution Take 3 mLs (2.5 mg total) by nebulization every 6 (six) hours as needed for wheezing or shortness of breath. 11/05/17  Yes Emokpae, Courage, MD  apixaban (ELIQUIS) 5 MG TABS tablet Take 1 tablet (5 mg total) by mouth 2 (two) times daily. 08/19/19  Yes Bonnielee Haff, MD  gabapentin (NEURONTIN) 300 MG capsule Take 300 mg by mouth in the morning.  09/27/19  Yes [provider]  gabapentin (NEURONTIN) 600 MG tablet Take 600 mg by mouth at bedtime.   Yes [provider]  Infant Care Products (DERMACLOUD) CREA Apply 1 application topically in the morning and at bedtime.   Yes [provider]  lidocaine-prilocaine (EMLA) cream Apply 1 application topically as needed. Patient taking differently: Apply 1 application topically as needed (to numb port site.).  02/28/19  Yes Volanda Napoleon, MD  metFORMIN (GLUCOPHAGE) 500 MG tablet Take 1 tablet (500 mg total) by mouth 2 (two) times daily with a meal. 02/13/19  Yes Philemon Kingdom, MD  metoCLOPramide (REGLAN) 5 MG tablet Take 1 tablet (5 mg total) by mouth every 8 (eight) hours as needed for nausea (before meals as needed for nausea.). 08/23/19  Yes Arrien, Jimmy Picket, MD  metoprolol tartrate (LOPRESSOR) 25 MG tablet Take 0.5 tablets (12.5 mg total) by mouth 2 (two) times daily. 08/14/19  Yes Swayze, Ava, DO  midodrine (PROAMATINE) 10 MG tablet Take 1 tablet (10  mg total) by mouth 3 (three) times daily with meals. Patient taking differently: Take 10 mg by mouth 3 (three) times daily.  09/27/19  Yes Mercy Riding, MD  Multiple Vitamins-Minerals (CENTRUM SILVER ADULT 50+) TABS Take 1 tablet once a day Patient taking differently: Take 1 tablet by mouth daily. Take 1 tablet once a day 02/20/19  Yes Lowne Lyndal Pulley R, DO  nitroGLYCERIN (NITROSTAT) 0.4 MG SL tablet Place 1 tablet (0.4 mg total) under the tongue every 5 (five) minutes as needed. Patient taking differently: Place 0.4 mg under the tongue every 5 (five) minutes as needed for chest pain.  01/14/19  Yes Lorretta Harp, MD  Nutritional Supplement LIQD Take 120 mLs by mouth 3 (three) times daily - between meals and at bedtime.   Yes [provider]  nystatin (MYCOSTATIN/NYSTOP) powder Apply topically 3 (three) times daily. Patient taking differently: Apply 1 application topically 2 (two) times daily.  08/14/19  Yes Swayze, Ava, DO  ondansetron (ZOFRAN) 4 MG tablet Take 1 tablet (4 mg total) by mouth every 6 (six) hours as needed for nausea. 09/27/19  Yes Mercy Riding, MD  oxybutynin (DITROPAN-XL) 5 MG 24 hr tablet Take 5 mg by mouth daily.   Yes [provider]  pantoprazole (PROTONIX) 40 MG tablet Take 1 tablet (40 mg total) by mouth daily. 07/16/19  Yes Ann Held, DO  promethazine (PHENERGAN) 25 MG/ML injection Inject 25 mg into the vein every 6 (  six) hours as needed for nausea or vomiting.   Yes [provider]  rosuvastatin (CRESTOR) 20 MG tablet Take 1 tablet (20 mg total) by mouth daily at 6 PM. Patient taking differently: Take 20 mg by mouth daily.  08/14/19  Yes Swayze, Ava, DO  sertraline (ZOLOFT) 50 MG tablet Take 1 tablet (50 mg total) by mouth daily. Patient taking differently: Take 75 mg by mouth daily.  02/20/19  Yes Ann Held, DO  traMADol (ULTRAM) 50 MG tablet Take 50 mg by mouth every 8 (eight) hours as needed for moderate pain.   Yes  [provider]  traZODone (DESYREL) 50 MG tablet Take 0.5 tablets (25 mg total) by mouth at bedtime as needed for sleep. 08/14/19  Yes Swayze, Ava, DO  Triamcinolone Acetonide (TRIAMCINOLONE 0.1 % CREAM : EUCERIN) CREA Apply 1 application topically daily. To whole body   Yes [provider]  tuberculin 5 UNIT/0.1ML injection Inject 5 Units into the skin once a week.   Yes [provider]  glucose blood (ONETOUCH VERIO) test strip Use as instructed to check blood sugar 3 times a day Patient taking differently: 1 each by Other route See admin instructions. Use as instructed to check blood sugar 3 times a day 09/04/18   Philemon Kingdom, MD       Ina Homes, MD  [facility] PGY3  Pager: [phone]

## 2019-11-23 NOTE — Progress Notes (Signed)
Chaplain provided prayer around [place] with her four daughters.  Annitta was described by one daughter as being strong and doing everything she could for her children. Chirsty has been detailed as being a woman who did not take "no" for an answer especially when it concerned her children.  She has raised two children with chronic illnesses and a set of twins with little to no help.  One of her daughters spoke of not wanting her mother to suffer anymore and detailed the health journey she has been on within the last couple of months.  Chaplain affirmed the love they have for their mother and was thankful they could be there to surround her.  Chaplain will follow-up as needed.

## 2019-11-23 NOTE — Death Summary Note (Signed)
Patient transitioned to comfort care and made a DNR in collaboration with family, palliative care, and critical care team. Fentanyl gtt started for comfort. Patient terminally extubated at 1429. PRN robinul and ativan given at time of extubation. Patient's 4 daughters at bedside throughout. Patient went asystole at 1557. Death pronounced by this RN and Janith Lima RN. Patient's daughters given patient placement information. Post-mortem care completed and patient transported to morgue, patient placement notified. 100 mL fentanyl gtt wasted w/ Eloise Harman RN.  [employer] RN

## 2019-11-23 NOTE — Progress Notes (Signed)
eLink Physician-Brief Progress Note Patient Name: [name] DOB: [birth year] MRN: [id number]   Date of Service  11/08/2019  HPI/Events of Note  Request for AM lab orders.  eICU Interventions  Will order: 1. CBC with platelets, CMP and Mg++ level in AM.      Intervention Category Major Interventions: Other:  [name] 11/08/2019, 3:19 AM

## 2019-11-23 NOTE — Progress Notes (Signed)
Spoke with the patient's daughters. Discuss the grave prognosis with the patient in septic shock with multiorgan failure. Discussed that we have reached the limits of medical capabilities. The daughters would like to shift focus to comfort. Will start the patient on fentanyl and place end of life orders. Extubation order placed. Do not extubate until family has had a chance to see her.   Ina Homes, MD  [facility] PGY3  Pager: [phone]

## 2019-11-23 NOTE — Consult Note (Signed)
Referral MD  Reason for Referral: Sepsis, remote history of stomach cancer, diabetes  Chief Complaint  Patient presents with  . Altered Mental Status  : Patient is intubated.  HPI: [name] is well-known to me.  She is a 69 year old Afro-American female.  She has multiple medical problems.  I treated her about 11 years ago for localized stomach cancer.  She had surgery followed by radiation and chemotherapy.  She has been in remission.  She has poorly controlled diabetes.  This is been an ongoing problem for her.  She has multiple other health issues.  She has been hospitalized on several occasions recently.  I think she has had urinary tract infections.  She has been to nursing homes.  She now is readmitted.  I think she came in on 11/11/2019.  She came in from a nursing home.  She is hypotensive.  She was confused.  Her ammonia was 163.  She had albumin of 1.6.  Her INR was 3.5.  Cultures have been taken.  She unfortunately declined.  She now is on life support with mechanical ventilation.  She is on pressor support.  Today, her albumin is 1.7.  Bilirubin is 1.8.  BUN is 16 creatinine 3.  Glucose is 224.  Her CBC shows white cell count of 17.9.  Hemoglobin 7.3.  Platelet count 82,000.  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  I think that an ammonia of 846 is certainly highly indicative of hepatic dysfunction.  Her INR being quite high and albumin being incredibly low also shows that she does has a marginal hepatic function at best.  I know she has longstanding hepatic steatosis.  I suspect that she probably has cirrhosis.  I am not sure that she is going to be able to really pull out of this.  I just have a sense that she is developing multiorgan failure.  I know that she has lost quite a bit of weight.  It looks like she is being treated incredibly aggressively.  The staff in the cardiac ICU obviously is doing a great job with her.  I am just  not sure that her body is really going to be able to withstand much more.  I will try to speak to her daughter, who I have known for probably close to 20 years.     Past Medical History:  Diagnosis Date  . Asthma   . Degenerative joint disease   . Diabetes mellitus   . Dyspnea   . GERD (gastroesophageal reflux disease)   . Hyperlipidemia    no longer on medication for this  . Hypertension    patient denies  . Myocardial infarction ([place]) 08/2018  . Neuropathy    bilateral hands and feet  . Sickle cell trait ([place])   . Sleep apnea   . stomach ca dx'd 2010   chemo/xrt comp 05/2009  . TIA (transient ischemic attack) 07/2015  :  Past Surgical History:  Procedure Laterality Date  . APPENDECTOMY    . CESAREAN SECTION    . COLON SURGERY     colonscopy  . COLONOSCOPY WITH PROPOFOL N/A 06/01/2016   Procedure: COLONOSCOPY WITH PROPOFOL;  Surgeon: Wilford Corner, MD;  Location: [facility] ENDOSCOPY;  Service: Endoscopy;  Laterality: N/A;  . CORONARY STENT INTERVENTION N/A 08/29/2018   Procedure: CORONARY STENT INTERVENTION;  Surgeon: Wellington Hampshire, MD;  Location: [place] CV LAB;  Service: Cardiovascular;  Laterality: N/A;  . CORONARY/GRAFT ACUTE  MI REVASCULARIZATION N/A 08/27/2018   Procedure: Coronary/Graft Acute MI Revascularization;  Surgeon: Leonie Man, MD;  Location: [place] CV LAB;  Service: Cardiovascular;  Laterality: N/A;  . ESOPHAGOGASTRODUODENOSCOPY N/A 10/15/2012   Procedure: ESOPHAGOGASTRODUODENOSCOPY (EGD);  Surgeon: Lear Ng, MD;  Location: [facility];  Service: Endoscopy;  Laterality: N/A;  . ESOPHAGOGASTRODUODENOSCOPY N/A 01/15/2016   Procedure: ESOPHAGOGASTRODUODENOSCOPY (EGD);  Surgeon: Ronald Lobo, MD;  Location: [facility]OSCOPY;  Service: Endoscopy;  Laterality: N/A;  . ESOPHAGOGASTRODUODENOSCOPY (EGD) WITH PROPOFOL N/A 09/04/2019   Procedure: ESOPHAGOGASTRODUODENOSCOPY (EGD) WITH PROPOFOL;  Surgeon: Ronald Lobo, MD;  Location: [place];   Service: Endoscopy;  Laterality: N/A;  possible esophageal dilatation  . HERNIA REPAIR    . LEFT HEART CATH AND CORONARY ANGIOGRAPHY N/A 08/27/2018   Procedure: LEFT HEART CATH AND CORONARY ANGIOGRAPHY;  Surgeon: Leonie Man, MD;  Location: [place] CV LAB;  Service: Cardiovascular;  Laterality: N/A;  . SHOULDER SURGERY     Left  . TOTAL HIP ARTHROPLASTY     Right  . TOTAL KNEE ARTHROPLASTY     Left  :   Current Facility-Administered Medications:  .  0.9 %  sodium chloride infusion, 250 mL, Intravenous, Continuous, Icard, Bradley L, DO, Stopped at 11/04/19 1225 .  Place/Maintain arterial line, , , Until Discontinued **AND** 0.9 %  sodium chloride infusion, , Intra-arterial, PRN, Helberg, Justin, MD .  albuterol (PROVENTIL) (2.5 MG/3ML) 0.083% nebulizer solution 2.5 mg, 2.5 mg, Nebulization, Q2H PRN, Icard, Bradley L, DO .  chlorhexidine (PERIDEX) 0.12 % solution 15 mL, 15 mL, Mouth Rinse, BID, Icard, Bradley L, DO, 15 mL at 11/04/19 2142 .  chlorhexidine gluconate (MEDLINE KIT) (PERIDEX) 0.12 % solution 15 mL, 15 mL, Mouth Rinse, BID, Icard, Bradley L, DO, 15 mL at 11-25-19 0113 .  Chlorhexidine Gluconate Cloth 2 % PADS 6 each, 6 each, Topical, Daily, Icard, Bradley L, DO, 6 each at 11/04/19 2142 .  docusate sodium (COLACE) capsule 100 mg, 100 mg, Oral, BID PRN, Icard, Bradley L, DO .  famotidine (PEPCID) IVPB 20 mg premix, 20 mg, Intravenous, Daily, Icard, Bradley L, DO, Stopped at 11/04/19 1014 .  fentaNYL 2575mg in NS 2564m(1012mml) infusion-PREMIX, 0-200 mcg/hr, Intravenous, Titrated, SomAnders SimmondsD, Last Rate: 5 mL/hr at 04/May 03, 202100, 50 mcg/hr at 10/2019/11/298 .  Gerhardt's butt cream, , Topical, BID, Icard, Bradley L, DO, Given at 11/04/19 2143 .  hydrocortisone sodium succinate (SOLU-CORTEF) 100 MG injection 100 mg, 100 mg, Intravenous, Q8H, CoeMarianna PaymentD, 100 mg at 11/04/19 2341 .  insulin aspart (novoLOG) injection 0-9 Units, 0-9 Units, Subcutaneous, Q4H,  SomAnders SimmondsD, 3 Units at 04/May 03, 202148 .  lactulose (CHRONULAC) 10 GM/15ML solution 30 g, 30 g, Oral, BID, SmiCandee FurbishD, 30 g at 11/04/19 2225 .  lactulose (CHRONULAC) enema 200 gm, 300 mL, Rectal, Once, SmiCandee FurbishD .  linezolid (ZYVOX) IVPB 600 mg, 600 mg, Intravenous, Q12H, Icard, Bradley L, DO, Stopped at 11/04/19 2319 .  MEDLINE mouth rinse, 15 mL, Mouth Rinse, q12n4p, Icard, Bradley L, DO, 15 mL at 11/04/19 1605 .  MEDLINE mouth rinse, 15 mL, Mouth Rinse, 10 times per day, Icard, Bradley L, DO, 15 mL at 04/May 03, 202133 .  meropenem (MERREM) 1 g in sodium chloride 0.9 % 100 mL IVPB, 1 g, Intravenous, Q12H, Icard, Bradley L, DO, Stopped at 11/04/19 2209 .  midodrine (PROAMATINE) tablet 10 mg, 10 mg, Oral, TID WC, [place], Adam, DO, 10 mg at 11/04/19 1605 .  norepinephrine (LEVOPHED) 16 mg in 240m premix infusion, 0-60 mcg/min, Intravenous, Titrated, Icard, Bradley L, DO, Last Rate: 32.8 mL/hr at 011-May-20210600, 35 mcg/min at 02021/05/110600 .  sodium bicarbonate 150 mEq in dextrose 5% 1000 mL infusion, 150 mEq, Intravenous, Continuous, SCandee Furbish MD, Last Rate: 100 mL/hr at 005/11/210600, Rate Verify at 005/11/210600 .  sodium chloride flush (NS) 0.9 % injection 10-40 mL, 10-40 mL, Intracatheter, Q12H, Icard, Bradley L, DO, 10 mL at 11/04/19 2143 .  sodium chloride flush (NS) 0.9 % injection 10-40 mL, 10-40 mL, Intracatheter, PRN, Icard, Bradley L, DO .  thiamine (B-1) 250 mg in sodium chloride 0.9 % 50 mL IVPB, 250 mg, Intravenous, Q24H, Icard, Bradley L, DO, Stopped at 11/04/19 1727 .  vasopressin (PITRESSIN) 40 Units in sodium chloride 0.9 % 250 mL (0.16 Units/mL) infusion, 0.03 Units/min, Intravenous, Continuous, CMarianna Payment MD, Last Rate: 11.25 mL/hr at 0May 11, 20210600, 0.03 Units/min at 005/11/20210600  Facility-Administered Medications Ordered in Other Encounters:  .  sodium chloride flush (NS) 0.9 % injection 10 mL, 10 mL, Intravenous, PRN, [place], Sarah M, NP,  10 mL at 09/13/16 1146:  . chlorhexidine  15 mL Mouth Rinse BID  . chlorhexidine gluconate (MEDLINE KIT)  15 mL Mouth Rinse BID  . Chlorhexidine Gluconate Cloth  6 each Topical Daily  . Gerhardt's butt cream   Topical BID  . hydrocortisone sod succinate (SOLU-CORTEF) inj  100 mg Intravenous Q8H  . insulin aspart  0-9 Units Subcutaneous Q4H  . lactulose  30 g Oral BID  . lactulose  300 mL Rectal Once  . mouth rinse  15 mL Mouth Rinse q12n4p  . mouth rinse  15 mL Mouth Rinse 10 times per day  . midodrine  10 mg Oral TID WC  . sodium chloride flush  10-40 mL Intracatheter Q12H  :  Allergies  Allergen Reactions  . Cephalexin Itching, Rash and Other (See Comments)    Sensation of throat closing. Tolerated pip/tazo, cefepime, ceftriaxone before   . Adhesive [Tape] Other (See Comments)    Burn Skin  . Codeine Other (See Comments)    paranoid  . Zocor [Simvastatin - High Dose] Other (See Comments)    Muscle spasms  :  Family History  Problem Relation Age of Onset  . Diabetes Brother   . Alzheimer's disease Father 942 . Cancer Mother 748 . Diabetes Maternal Grandmother   . Cancer Maternal Grandfather        lung  . Healthy Child   . Suicidality Neg Hx   . Depression Neg Hx   . Dementia Neg Hx   . Anxiety disorder Neg Hx   :  Social History   Socioeconomic History  . Marital status: Divorced    Spouse name: Not on file  . Number of children: 4  . Years of education: Not on file  . Highest education level: High school graduate  Occupational History  . Occupation: Retired aProduct/process development scientist [employer]  . Smoking status: Former Smoker    Packs/day: 1.00    Years: 15.00    Pack years: 15.00    Types: Cigarettes    Start date: 09/27/1973    Quit date: 10/15/1988    Years since quitting: 31.0  . Smokeless tobacco: Never Used  . Tobacco comment: quit smoking 14 years ago  Substance and Sexual Activity  . Alcohol use: No    Alcohol/week: 0.0 standard  drinks  . Drug  use: No  . Sexual activity: Not on file  Other Topics Concern  . Not on file  Social History Narrative   Lives alone, 4 children live near her. She would like to move to an independent retirement community   Social Determinants of Radio broadcast assistant Strain: Low Risk   . Difficulty of Paying Living Expenses: Not very hard  Food Insecurity: No Food Insecurity  . Worried About Charity fundraiser in the Last Year: Never true  . Ran Out of Food in the Last Year: Never true  Transportation Needs: No Transportation Needs  . Lack of Transportation (Medical): No  . Lack of Transportation (Non-Medical): No  Physical Activity: Insufficiently Active  . Days of Exercise per Week: 3 days  . Minutes of Exercise per Session: 30 min  Stress: No Stress Concern Present  . Feeling of Stress : Not at all  Social Connections: Somewhat Isolated  . Frequency of Communication with Friends and Family: More than three times a week  . Frequency of Social Gatherings with Friends and Family: More than three times a week  . Attends Religious Services: More than 4 times per year  . Active Member of Clubs or Organizations: No  . Attends Archivist Meetings: Never  . Marital Status: Divorced  Human resources officer Violence: Not At Risk  . Fear of Current or Ex-Partner: No  . Emotionally Abused: No  . Physically Abused: No  . Sexually Abused: No  :  Review of Systems  Constitutional: Positive for weight loss.  HENT: Negative.   Eyes: Negative.   Respiratory: Negative.   Cardiovascular: Negative.   Gastrointestinal: Positive for abdominal pain and nausea.  Genitourinary: Negative.   Musculoskeletal: Negative.   Skin: Negative.   Neurological: Negative.   Endo/Heme/Allergies: Negative.   Psychiatric/Behavioral: Negative.      Exam:  As above Patient Vitals for the past 24 hrs:  BP Temp Temp src Pulse Resp SpO2 Weight  11/25/19 0645 -- (!) 96.8 F (36 C) -- -- -- --  --  2019-11-25 0630 -- (!) 96.8 F (36 C) -- -- -- -- --  2019-11-25 0615 -- (!) 96.8 F (36 C) -- -- -- -- --  2019-11-25 0600 107/64 (!) 96.8 F (36 C) -- -- -- -- --  Nov 25, 2019 0545 -- (!) 96.8 F (36 C) -- -- -- -- --  11-25-19 0530 -- (!) 96.8 F (36 C) -- -- -- -- --  11-25-2019 0515 -- (!) 96.8 F (36 C) -- -- 17 -- --  11/25/19 0500 113/69 (!) 96.8 F (36 C) -- -- (!) 32 -- 225 lb 8.5 oz (102.3 kg)  2019-11-25 0445 -- (!) 96.8 F (36 C) -- -- (!) 32 -- --  Nov 25, 2019 0430 -- (!) 96.8 F (36 C) -- -- (!) 32 -- --  11-25-2019 0415 -- (!) 96.8 F (36 C) -- -- (!) 29 -- --  Nov 25, 2019 0400 102/68 (!) 97 F (36.1 C) Bladder -- 18 100 % --  2019-11-25 0345 -- (!) 97 F (36.1 C) -- -- 18 -- --  2019/11/25 0330 -- (!) 97 F (36.1 C) -- -- 16 -- --  11/25/2019 0315 -- (!) 97.2 F (36.2 C) -- -- 16 -- --  11-25-2019 0308 -- -- -- -- (!) 32 100 % --  2019/11/25 0300 96/70 (!) 97.2 F (36.2 C) -- 96 17 96 % --  11-25-2019 0245 -- (!) 97.2 F (36.2 C) -- (!) 103 19 96 % --  11/14/2019 0230 -- (!) 97.2 F (36.2 C) -- (!) 103 (!) 26 100 % --  2019-11-14 0215 -- (!) 97 F (36.1 C) -- (!) 121 (!) 24 (!) 76 % --  Nov 14, 2019 0200 (!) 110/51 (!) 97 F (36.1 C) -- -- (!) 32 -- --  Nov 14, 2019 0145 -- (!) 97 F (36.1 C) -- -- (!) 32 -- --  11-14-19 0130 -- (!) 97 F (36.1 C) -- -- (!) 32 -- --  November 14, 2019 0115 -- (!) 97 F (36.1 C) -- -- (!) 32 -- --  14-Nov-2019 0100 106/70 (!) 96.8 F (36 C) -- -- (!) 32 -- --  Nov 14, 2019 0045 -- (!) 96.8 F (36 C) -- 86 (!) 32 100 % --  14-Nov-2019 0030 -- (!) 96.8 F (36 C) -- 86 (!) 32 100 % --  November 14, 2019 0015 -- (!) 96.8 F (36 C) -- -- (!) 32 -- --  11-14-19 0000 106/65 (!) 96.6 F (35.9 C) -- 89 (!) 32 100 % --  11/04/19 2345 -- (!) 96.6 F (35.9 C) -- 90 (!) 25 100 % --  11/04/19 2330 -- (!) 96.6 F (35.9 C) -- 92 (!) 26 100 % --  11/04/19 2315 -- (!) 96.8 F (36 C) -- 93 (!) 27 100 % --  11/04/19 2306 -- -- -- -- -- 94 % --  11/04/19 2300 99/65 (!) 96.8 F (36 C) -- 92  (!) 34 98 % --  11/04/19 2245 -- (!) 96.8 F (36 C) -- 93 (!) 28 100 % --  11/04/19 2230 -- (!) 96.6 F (35.9 C) -- 97 (!) 22 92 % --  11/04/19 2215 -- (!) 96.6 F (35.9 C) -- -- 17 -- --  11/04/19 2200 -- (!) 96.6 F (35.9 C) -- -- 19 -- --  11/04/19 2145 -- (!) 96.6 F (35.9 C) -- 92 18 98 % --  11/04/19 2130 -- (!) 96.6 F (35.9 C) -- -- 19 -- --  11/04/19 2115 -- (!) 96.6 F (35.9 C) -- -- 20 -- --  11/04/19 2100 -- (!) 96.6 F (35.9 C) -- 95 (!) 23 96 % --  11/04/19 2045 -- (!) 96.6 F (35.9 C) -- 97 (!) 23 96 % --  11/04/19 2030 -- (!) 96.6 F (35.9 C) -- 92 (!) 23 99 % --  11/04/19 2015 -- (!) 96.6 F (35.9 C) -- 100 (!) 23 96 % --  11/04/19 2000 -- (!) 96.6 F (35.9 C) Bladder 100 (!) 22 96 % --  11/04/19 1954 -- -- -- 100 -- 98 % --  11/04/19 1945 -- (!) 96.6 F (35.9 C) -- 96 (!) 23 (!) 89 % --  11/04/19 1930 -- (!) 96.6 F (35.9 C) -- (!) 102 (!) 23 97 % --  11/04/19 1915 -- (!) 96.6 F (35.9 C) -- -- (!) 25 -- --  11/04/19 1900 -- (!) 96.8 F (36 C) -- -- (!) 26 -- --  11/04/19 1845 -- (!) 96.8 F (36 C) -- -- (!) 26 -- --  11/04/19 1830 -- (!) 96.8 F (36 C) -- -- (!) 27 -- --  11/04/19 1815 -- (!) 96.8 F (36 C) -- (!) 101 (!) 27 99 % --  11/04/19 1800 -- (!) 96.8 F (36 C) -- 95 (!) 21 100 % --  11/04/19 1745 -- (!) 97 F (36.1 C) -- 96 20 100 % --  11/04/19 1730 -- (!) 97 F (36.1 C) -- 96 19 100 % --  11/04/19 1715 -- (!) 97.2 F (36.2 C) -- (!) 106 19 100 % --  11/04/19 1700 -- (!) 97.2 F (36.2 C) -- (!) 109 (!) 23 100 % --  11/04/19 1645 -- (!) 97.3 F (36.3 C) -- -- (!) 26 -- --  11/04/19 1630 -- (!) 97.3 F (36.3 C) -- -- (!) 21 -- --  11/04/19 1615 -- (!) 97.5 F (36.4 C) -- -- (!) 26 -- --  11/04/19 1600 -- (!) 97.5 F (36.4 C) -- -- 20 -- --  11/04/19 1545 -- (!) 97.3 F (36.3 C) -- -- (!) 22 -- --  11/04/19 1226 -- -- -- (!) 112 (!) 31 100 % --  11/04/19 1115 96/71 98.4 F (36.9 C) -- (!) 51 (!) 26 (!) 88 % --  11/04/19 1100 (!)  96/35 98.4 F (36.9 C) -- 62 (!) 26 95 % --  11/04/19 1015 (!) 89/76 97.7 F (36.5 C) -- (!) 28 (!) 27 (!) 87 % --  11/04/19 1000 (!) 82/51 (!) 96.4 F (35.8 C) -- -- (!) 24 -- --  11/04/19 0945 (!) 85/72 98.4 F (36.9 C) -- -- (!) 30 -- --  11/04/19 0930 94/68 98.2 F (36.8 C) -- -- 20 -- --  11/04/19 0915 95/73 98.2 F (36.8 C) -- -- (!) 37 -- --  11/04/19 0900 98/85 98.1 F (36.7 C) -- -- (!) 40 -- --  11/04/19 0845 (!) 96/56 97.9 F (36.6 C) -- -- (!) 32 -- --  11/04/19 0830 121/78 97.9 F (36.6 C) -- (!) 113 (!) 26 97 % --  11/04/19 0815 (!) 88/26 97.7 F (36.5 C) -- -- (!) 22 -- --  11/04/19 0800 (!) 85/69 (!) 97.5 F (36.4 C) -- (!) 103 (!) 26 98 % --  11/04/19 0730 91/67 (!) 97 F (36.1 C) -- 100 (!) 25 100 % --  11/04/19 0715 (!) 88/67 (!) 96.8 F (36 C) -- 100 18 98 % --     Recent Labs    11/04/19 0147 11/04/19 0626 2019/11/17 0340 11-17-19 0340 Nov 17, 2019 0350 2019-11-17 0644  WBC 15.9*  --  17.9*  --   --   --   HGB 9.4*   < > 7.3*   < > 7.5* 7.5*  HCT 28.4*   < > 22.3*   < > 22.0* 22.0*  PLT 108*  --  82*  --   --   --    < > = values in this interval not displayed.   Recent Labs    11/04/19 1444 11/04/19 1752 Nov 17, 2019 0340 11/17/19 0340 17-Nov-2019 0350 11-17-2019 0644  NA 139   < > 139   < > 141 141  K 4.9   < > 4.5   < > 4.4 4.3  CL 110  --  109  --   --   --   CO2 10*  --  11*  --   --   --   GLUCOSE 145*  --  224*  --   --   --   BUN 17  --  16  --   --   --   CREATININE 2.66*  --  2.99*  --   --   --   CALCIUM 6.5*  --  6.7*  --   --   --    < > = values in this interval not displayed.    Blood smear review: None  Pathology: None    Assessment and  Plan: Ms. Hitt is a nice 69 year old African-American female.  She has a remote history of stage IB adenocarcinoma of the stomach.  This, thankfully, is not a problem for her.  Again I believe that her problem is hepatic failure.  I know her bilirubin is not really that high.  However, I have to  believe that given the pneumonia and the albumin and the INR, all this is highly indicative of severe hepatic dysfunction.  She now looks like she is developing renal insufficiency and probable renal failure.  It could be that all this is just from poorly controlled diabetes and she has developed hepatic steatosis and possibly cirrhosis.  Again I am not sure how much she is going to be able to withstand.  I know she is incredibly weak and that she has been through quite a bit this year alone.  She has had multiple hospitalizations.  Again I will try to speak with one of her daughters and try to let her know what I feel and maybe come up with some ideas as to how we can best support her quality of life and likely end-of-life.  Jessica Haw, MD   Acts 3:16

## 2019-11-23 NOTE — Progress Notes (Signed)
[facility] Progress Note Patient Name: [name] DOB: February 04, 1951 MRN: [id number]   Date of Service  30-Nov-2019  HPI/Events of Note  Ca++ = 6.7 which corrects to 8.54 (Low) given albumin = 1.7.  eICU Interventions  Will replace Ca++.      Intervention Category Major Interventions: Electrolyte abnormality - evaluation and management  [name] 11-30-2019, 5:17 AM

## 2019-11-23 NOTE — Patient Outreach (Signed)
[facility]) Care Management  12/05/2019  [name] 15-Feb-1951 [id number]     Transition of Care Referral  Referral Date: 05-Dec-2019 Referral Source: [facility] Discharge Report Date of Discharge: 11/02/2019 Facility: [facility] Insurance: [facility]   Referral received. Transition of care calls being completed via EMMI-automated calls. RN CM will outreach patient for any red flags received.     Plan: RN CM will close case at this time.    Enzo Montgomery, RN,BSN,CCM [place] Management Telephonic Care Management Coordinator Direct Phone: [phone] Toll Free: [phone] Fax: [phone]

## 2019-11-23 NOTE — Procedures (Signed)
Extubation Procedure Note  Patient Details:   Name: [name] DOB: 10-30-1950 MRN: [id number]   Airway Documentation:    Vent end date: Nov 21, 2019 Vent end time: 1429   Evaluation   Pt extubated to RA per Withdrawal of Life Protocol  Jesse Sans 2019-11-21, 2:30 PM

## 2019-11-23 NOTE — Death Summary Note (Signed)
  Name: [name] MRN: [id number] DOB: 11/28/1950 69 y.o.  Date of Admission: 11/17/2019 11:33 AM Date of Discharge: 11-13-2019 Attending Physician: Candee Furbish, MD  Discharge Diagnosis: Active Problems:   Sepsis [facility])  Cause of death: Multiorgan failure secondary to septic shock Time of death: 3220 on November 13, 2019  Disposition and follow-up:   [name] was discharged from [facility] in expired condition.    Hospital Course: [name] is a 69 y.o female with a hx of COVID-19 PNA, chronic hypotension on midodrine, and multiple hospitalizations for recurrent UTIs who presented to the ED with encephalopathy on 11/13/2019. She was found to be hypotensive with acute on chronic renal failure and lactic acidosis. She received volume resuscitation with 30cc/kg but remained hypotensive requiring Levophed. She was admitted to the ICU but unfortunately developed progressive encephalopathy and hypotension. She was intubated for respiratory failure and started on additional pressors. Over the next 48 hours she developed worsening renal and hepatic failure. After discussion with the family she was transitioned to comfort care and expired at 1557 on 11/13/2019.   Signed: Ina Homes, MD 11/13/19, 1:33 PM

## 2019-11-23 DEATH — deceased

## 2019-11-24 IMAGING — CR DG CHEST 2V
2 series · 2 of 2 positions shown · non-contrast
Comparison: 12/05/2017

CLINICAL DATA: Pt. Complains of "heaviness" in chest since this AM
with SOB. Pain in bilateral shoulders with numbness in left
hand/fingers. Hx of Diabetes Mellitus.CP

EXAM:
CHEST - 2 VIEW

[chest pa]
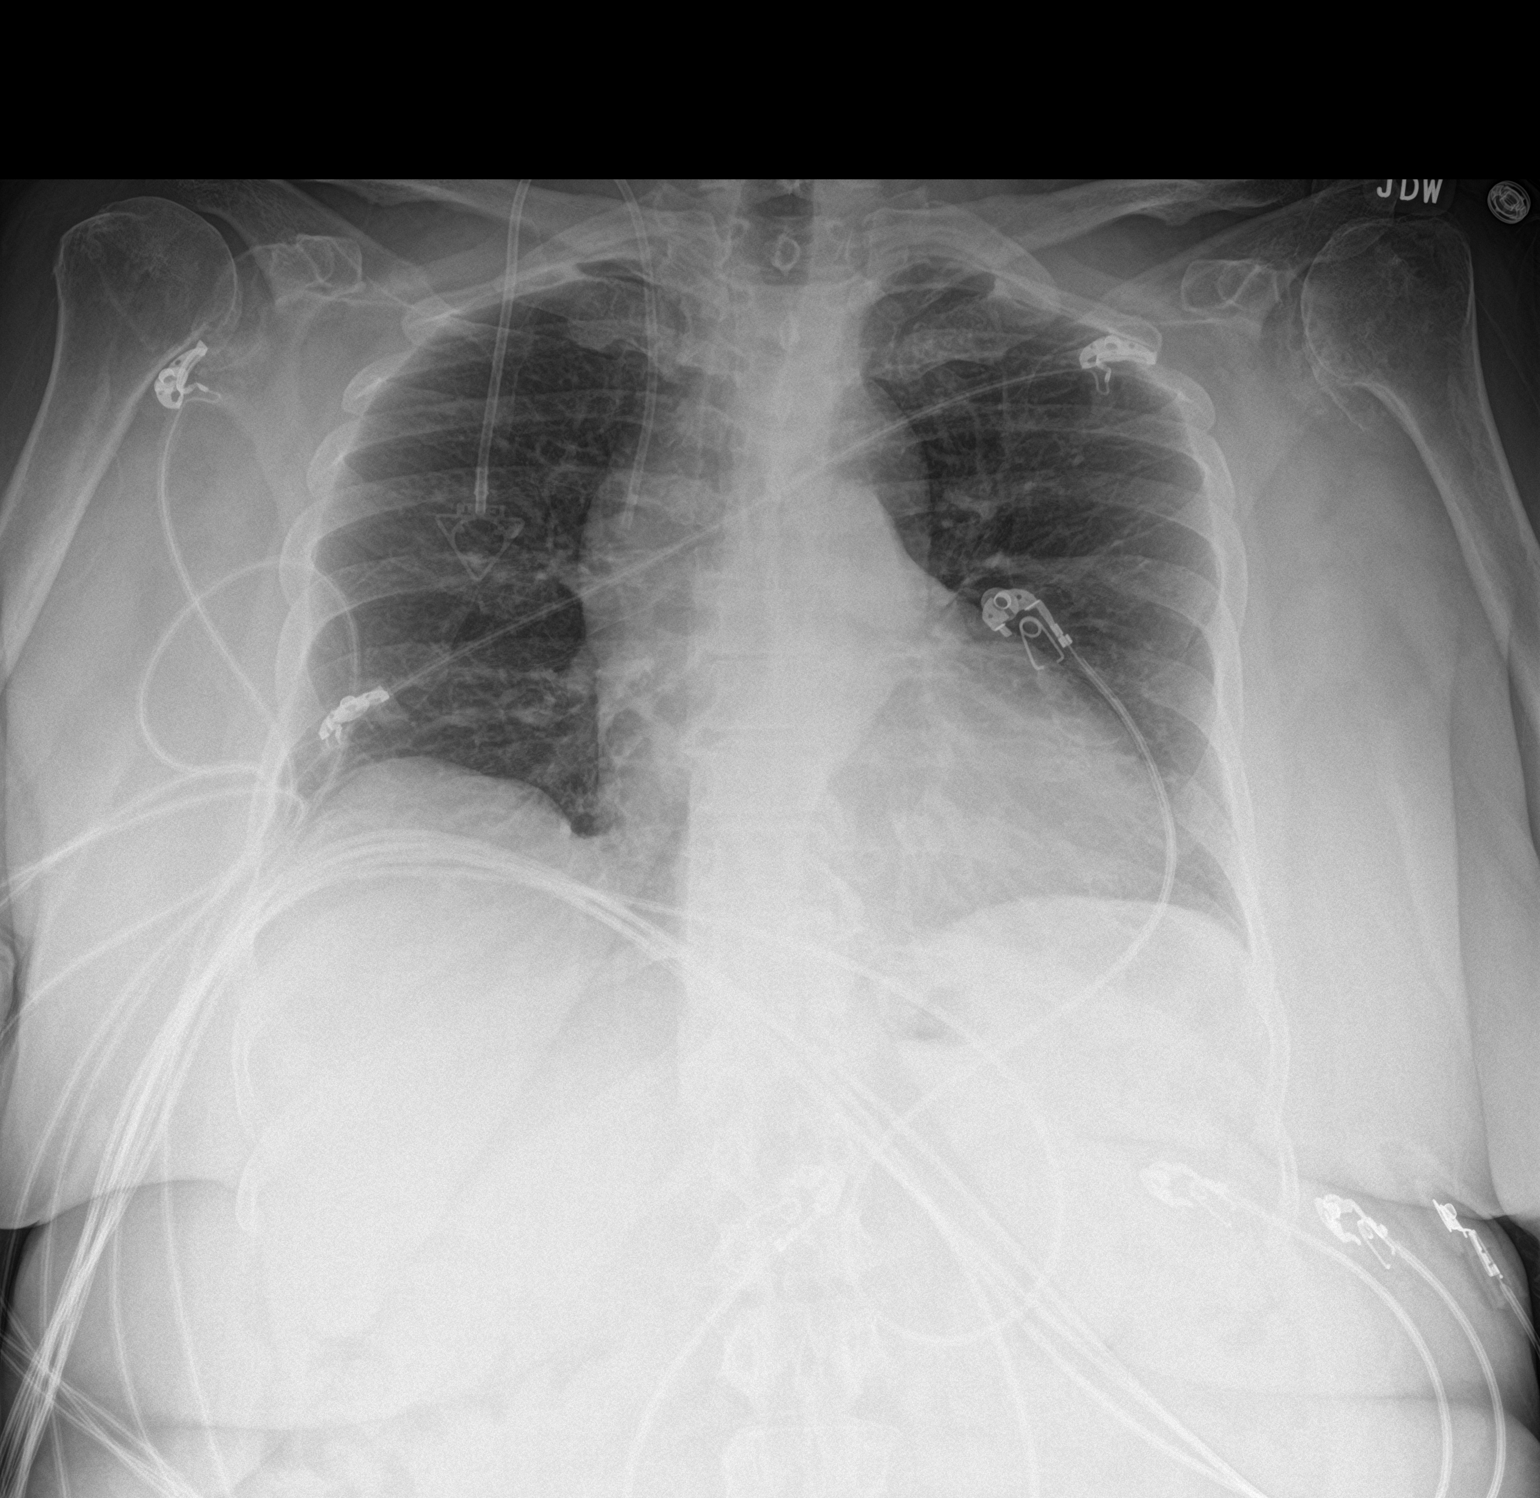

[chest lat]
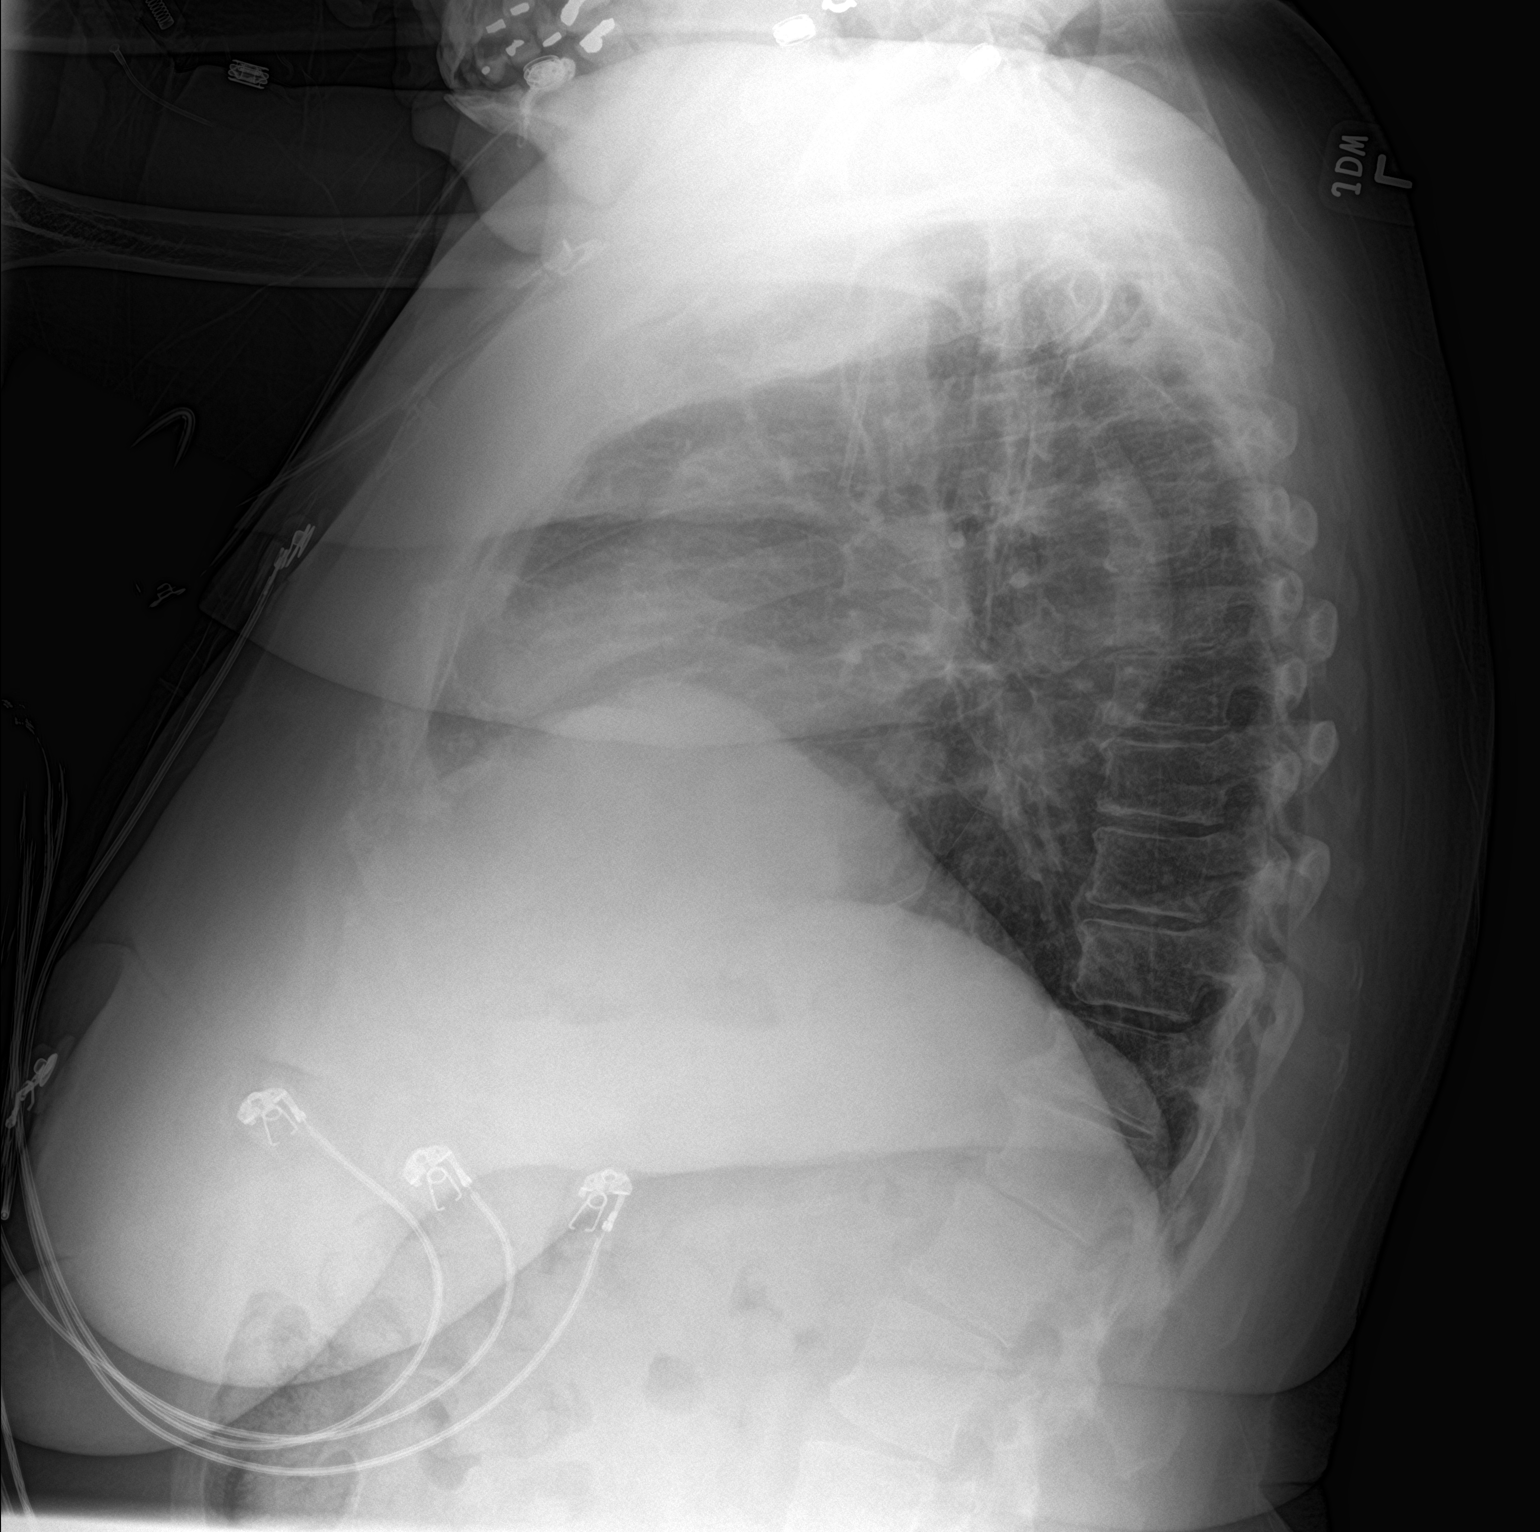

[2 of 2 positions shown; findings below may reference images not displayed]

FINDINGS: Port in the anterior chest wall with tip in distal SVC. Normal
cardiac silhouette ectatic aorta. Low lung volumes. No effusion,
infiltrate or pneumothorax. Degenerate changes shoulders.
IMPRESSION: No acute cardiopulmonary process.

## 2019-12-20 IMAGING — DX DG CHEST 2V
2 series · 2 of 2 positions shown · non-contrast
Comparison: 08/12/2018

CLINICAL DATA: Chest pain since MI 5 days ago.

EXAM:
CHEST - 2 VIEW

[chest pa]
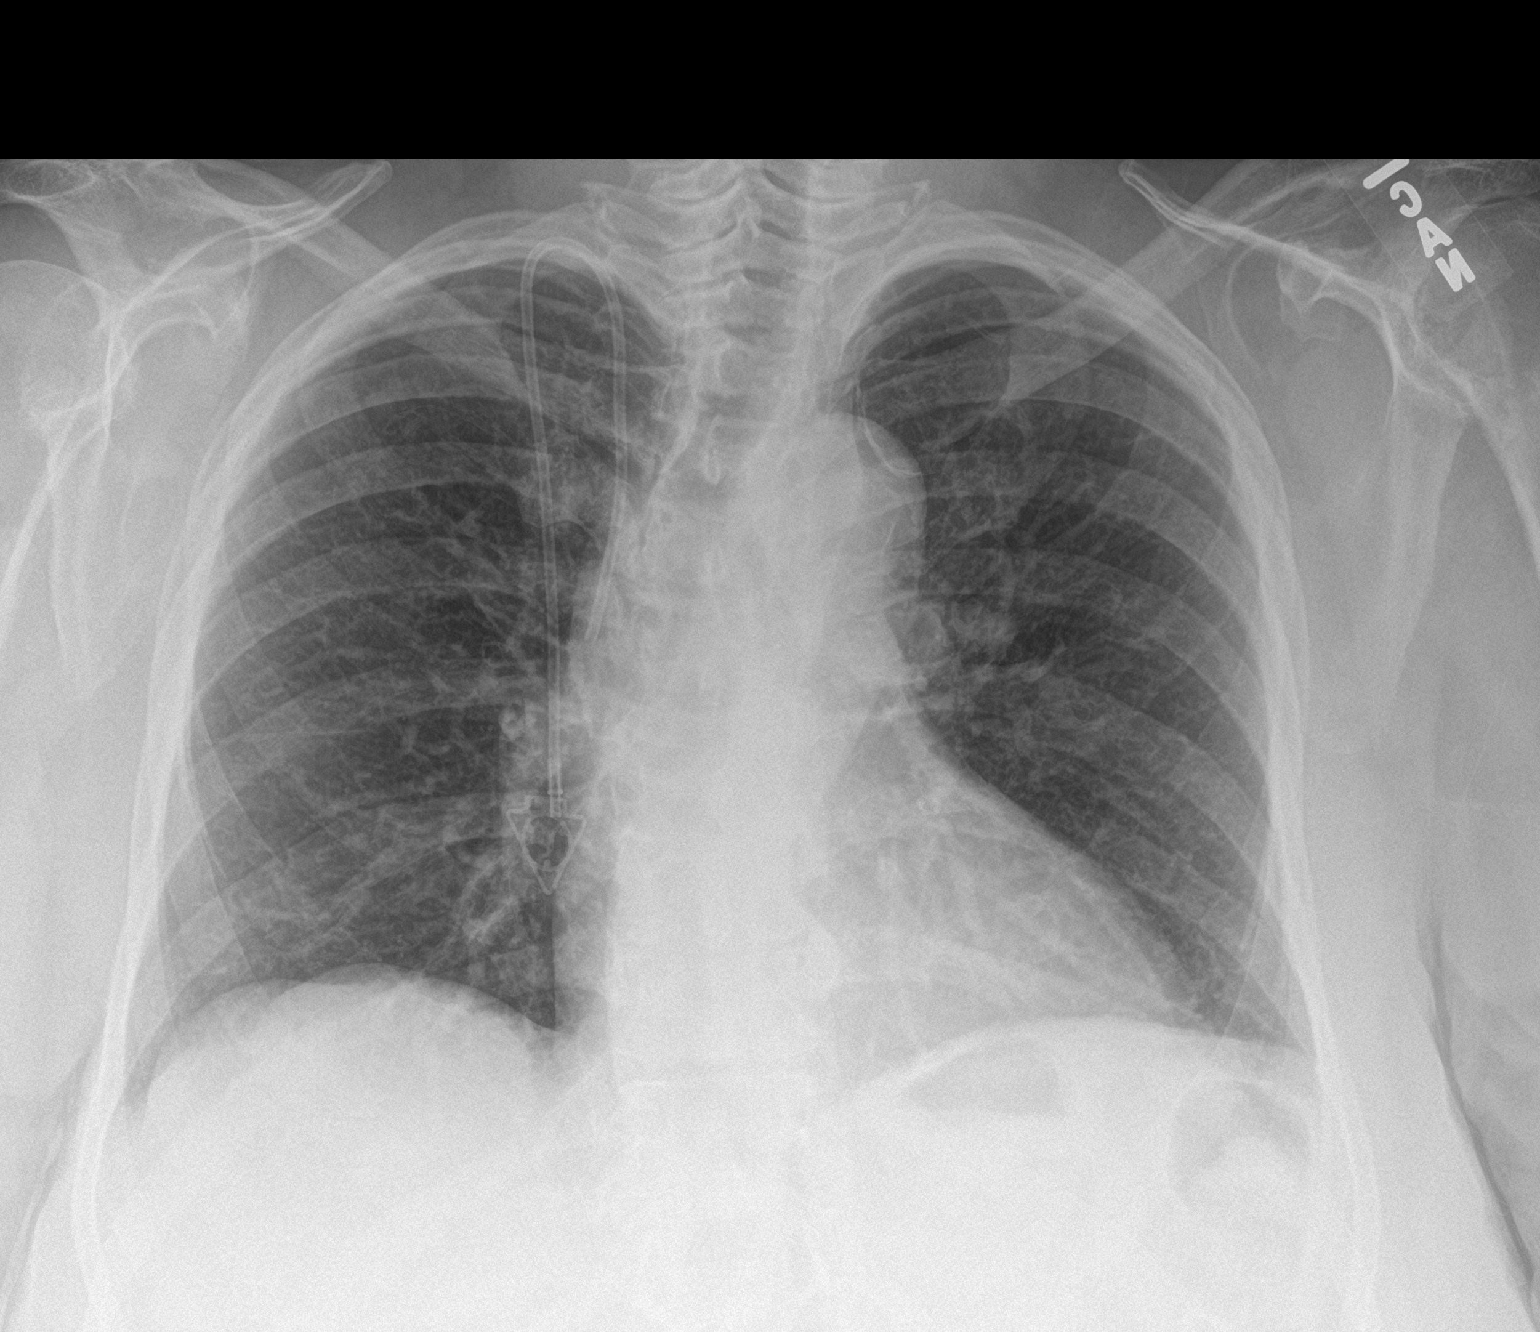

[chest lat]
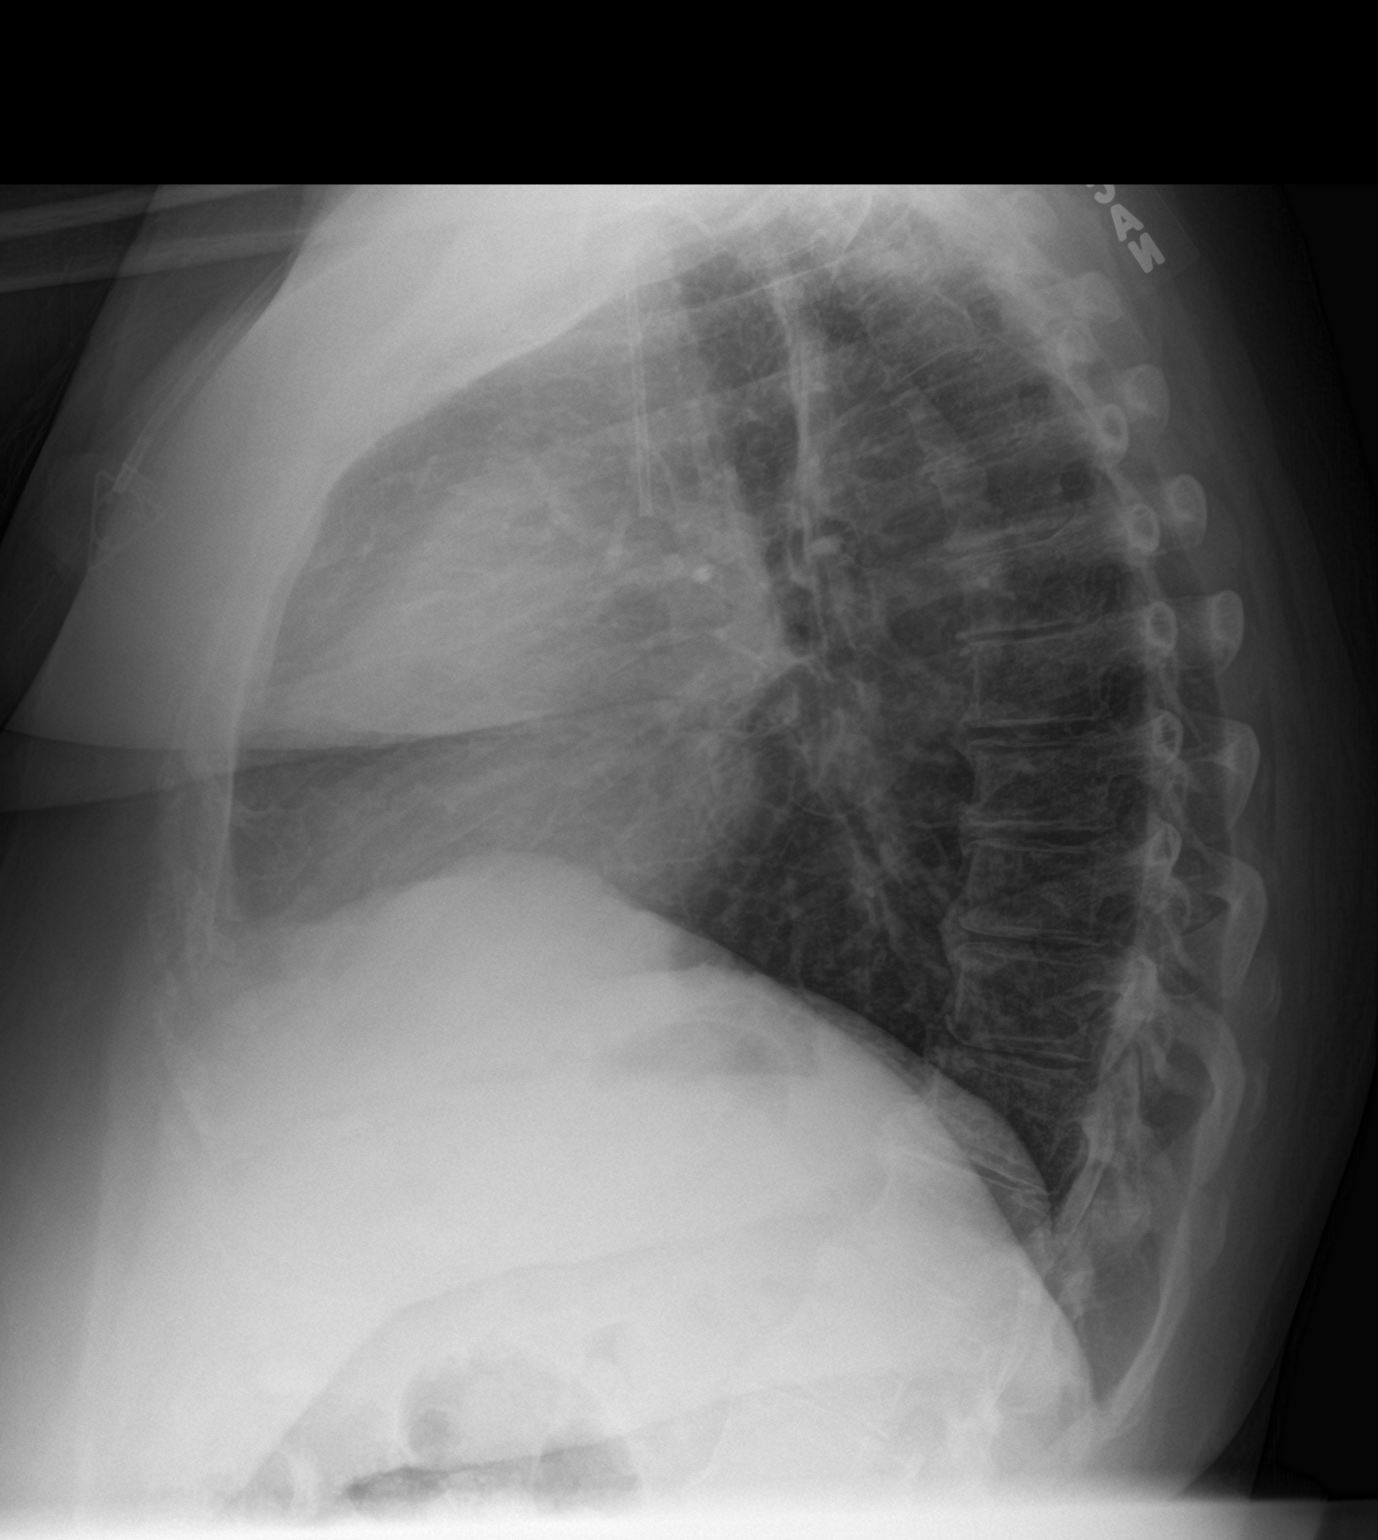

[2 of 2 positions shown; findings below may reference images not displayed]

FINDINGS: Right IJ Port-A-Cath unchanged with tip over the SVC. Lungs are
adequately inflated without focal airspace consolidation or
effusion. Cardiomediastinal silhouette and remainder of the exam is
unchanged.
IMPRESSION: No active cardiopulmonary disease.

## 2020-06-18 IMAGING — CT CT CERVICAL SPINE WITHOUT CONTRAST
4 of 7 series · 12 of 33 positions shown, 13 images · non-contrast
Comparison: Head CT 03/19/2018

CLINICAL DATA: Fall.  Head injury.

EXAM:
CT HEAD WITHOUT CONTRAST
CT CERVICAL SPINE WITHOUT CONTRAST
TECHNIQUE: Multidetector CT imaging of the head and cervical spine was
performed following the standard protocol without intravenous
contrast. Multiplanar CT image reconstructions of the cervical spine
were also generated.

[Series 7: c_spine 2.0 i30s 3 · axial · 0.29mm/px · z∈[-252,-182]mm · 3 of 71 slices shown]
[im 18/71  bone]
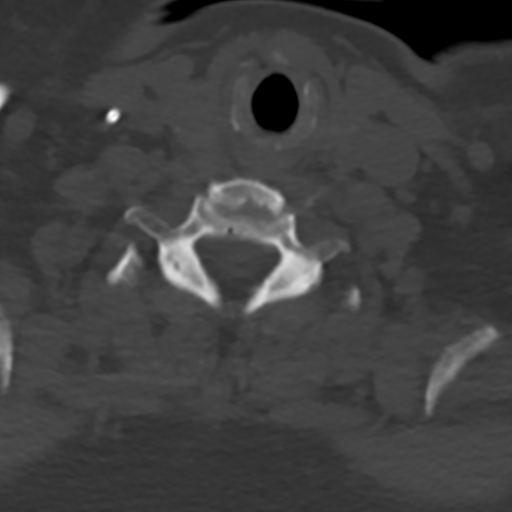
[im 36/71  bone]
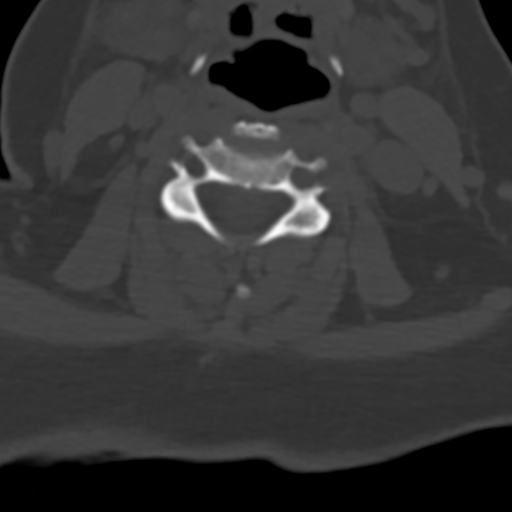
[im 53/71  bone]
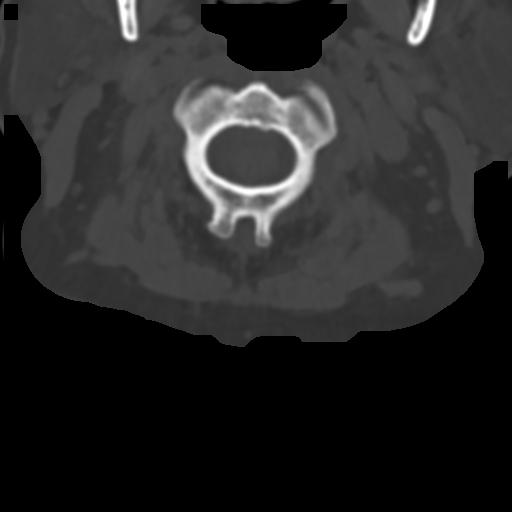

[Series 9: coronals · coronal · 0.20mm/px · 1 of 61 slices shown]
[im 31/61  bone]
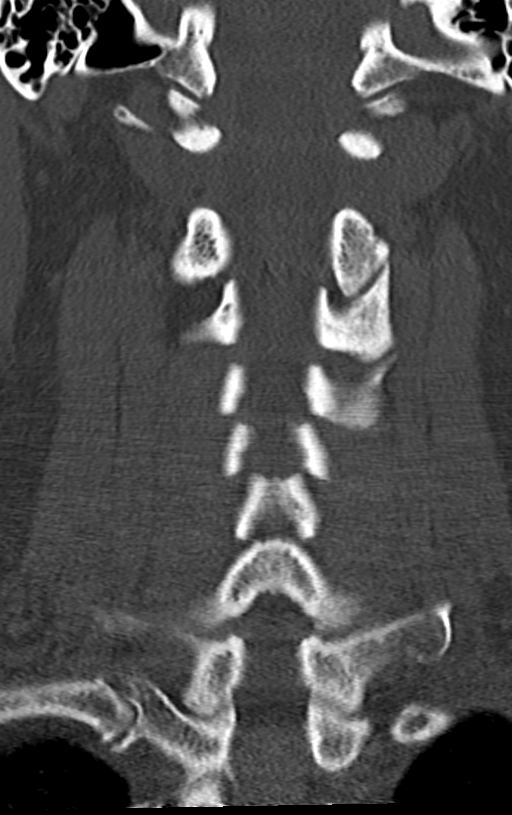

[Series 10: sagittals · sagittal · 0.20mm/px · 4 of 61 slices shown]
[im 13/61  bone]
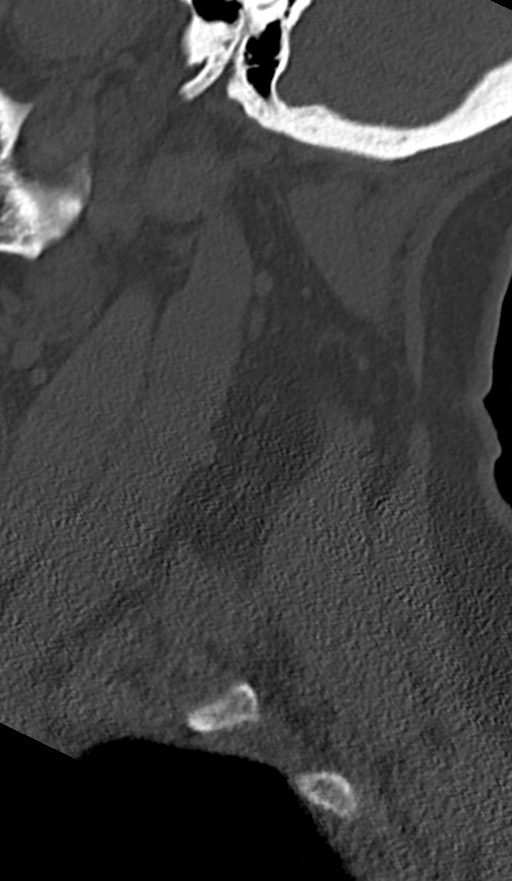
[im 25/61  bone]
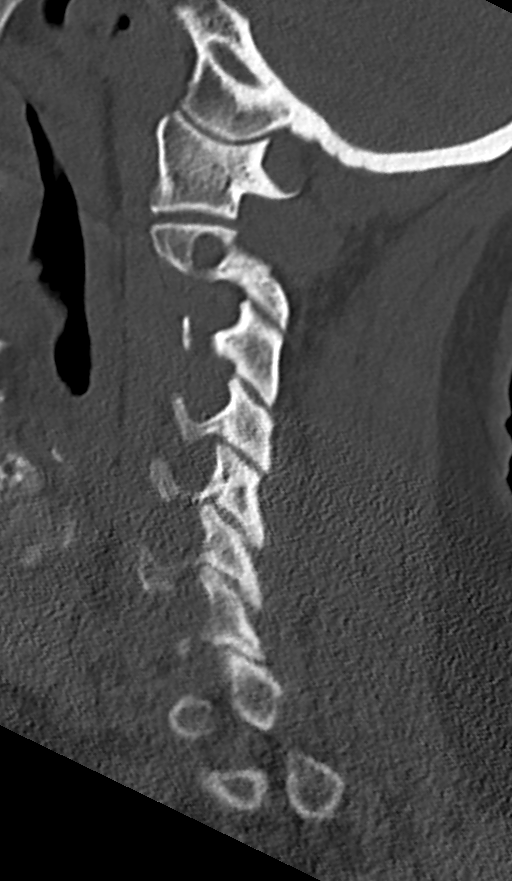
[im 37/61  bone]
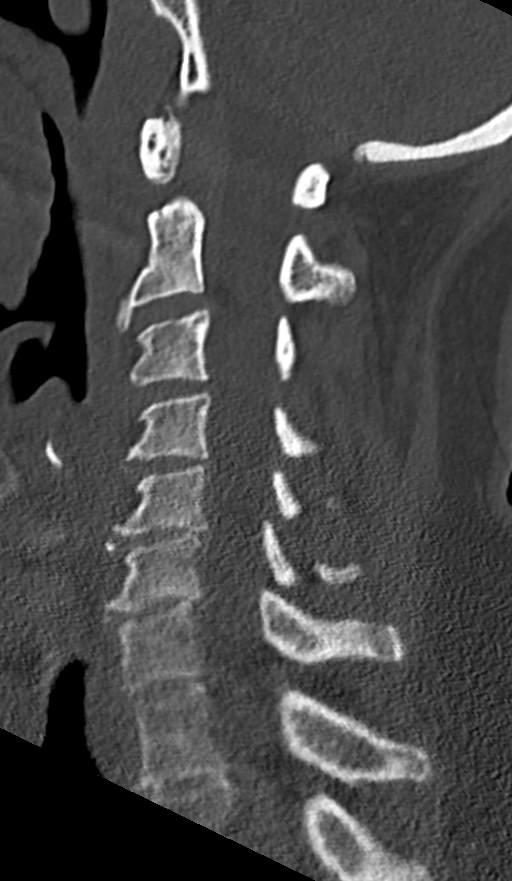
[im 49/61  bone]
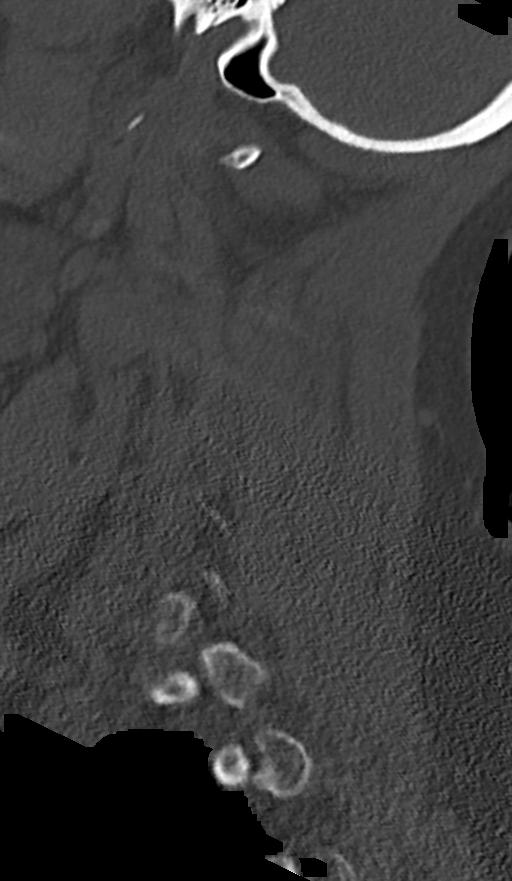

[Series 11: orthogonals · axial · 0.23mm/px · z∈[-280,-189]mm · 4 of 83 slices shown, 5 images]
[im 17/83  soft-tissue]
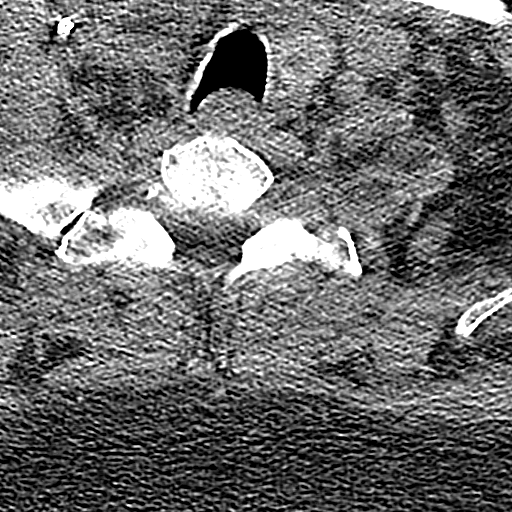
[im 17/83  bone]
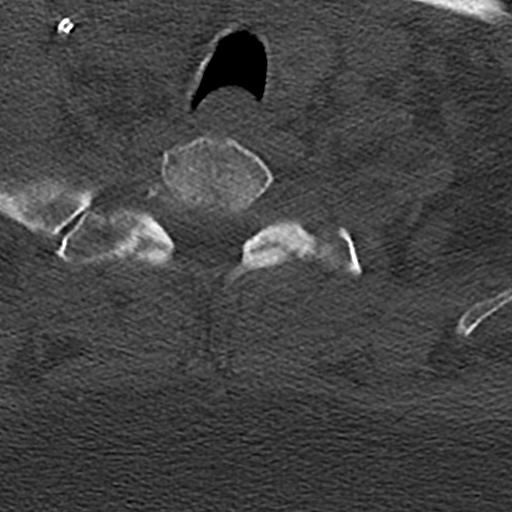
[im 33/83  bone]
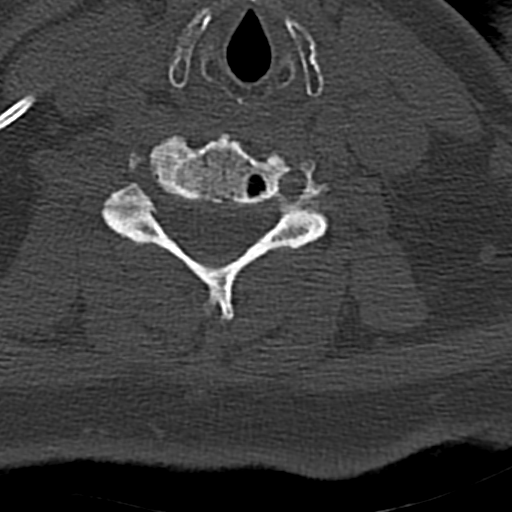
[im 50/83  bone]
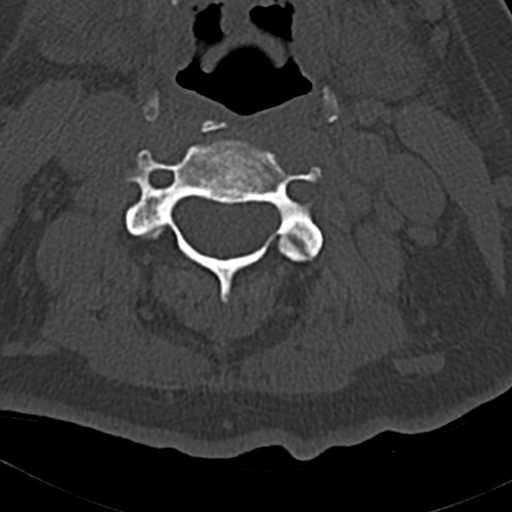
[im 66/83  bone]
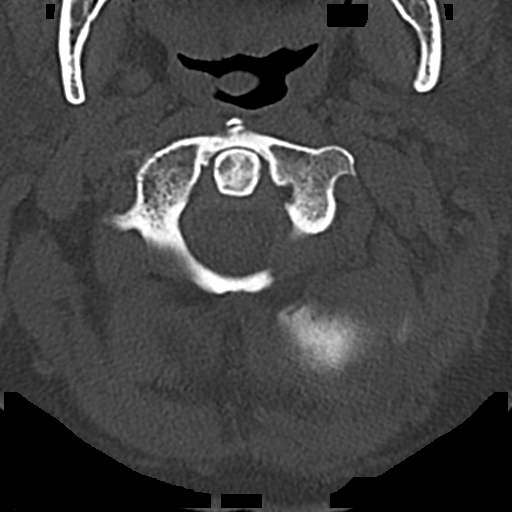

[12 of 33 positions shown; findings below may reference images not displayed]

FINDINGS: CT HEAD FINDINGS

Brain: There is no evidence for acute hemorrhage, hydrocephalus,
mass lesion, or abnormal extra-axial fluid collection. No definite
CT evidence for acute infarction.

Vascular: No hyperdense vessel or unexpected calcification.

Skull: No evidence for fracture. No worrisome lytic or sclerotic
lesion.

Sinuses/Orbits: The visualized paranasal sinuses and mastoid air
cells are clear. Visualized portions of the globes and intraorbital
fat are unremarkable.

Other: High left parietal scalp contusion with soft tissue gas
suggesting associated laceration.

CT CERVICAL SPINE FINDINGS

Alignment: Straightening of normal cervical lordosis.

Skull base and vertebrae: No acute fracture. No primary bone lesion
or focal pathologic process.

Soft tissues and spinal canal: No prevertebral fluid or swelling. No
visible canal hematoma. Bilateral thyroid nodules measuring up to
1.8 cm on the right.

Disc levels: Loss of disc height noted at C6-7. Facets are well
aligned bilaterally.

Upper chest: Unremarkable.

Other: None.
IMPRESSION: 1. No acute intracranial abnormality.
2. Left scalp contusion with associated laceration.
3. No cervical spine fracture. Loss of cervical lordosis. This can
be related to patient positioning, muscle spasm or soft tissue
injury.
4. Bilateral thyroid nodules measuring up to 1.8 cm in the right
lobe. Thyroid ultrasound recommended to further evaluate. This
follows ACR consensus guidelines: Managing Incidental Thyroid
Nodules Detected on Imaging: White Paper of [REDACTED]. [HOSPITAL] 0892; [DATE].

## 2020-06-18 IMAGING — DX LEFT KNEE - COMPLETE 4+ VIEW
4 series · 4 of 4 positions shown · non-contrast
Comparison: Radiographs January 14, 2015

CLINICAL DATA: Fall with abrasions anterior left knee, history of
left knee replacement

EXAM:
LEFT KNEE - COMPLETE 4+ VIEW

[knee ap]
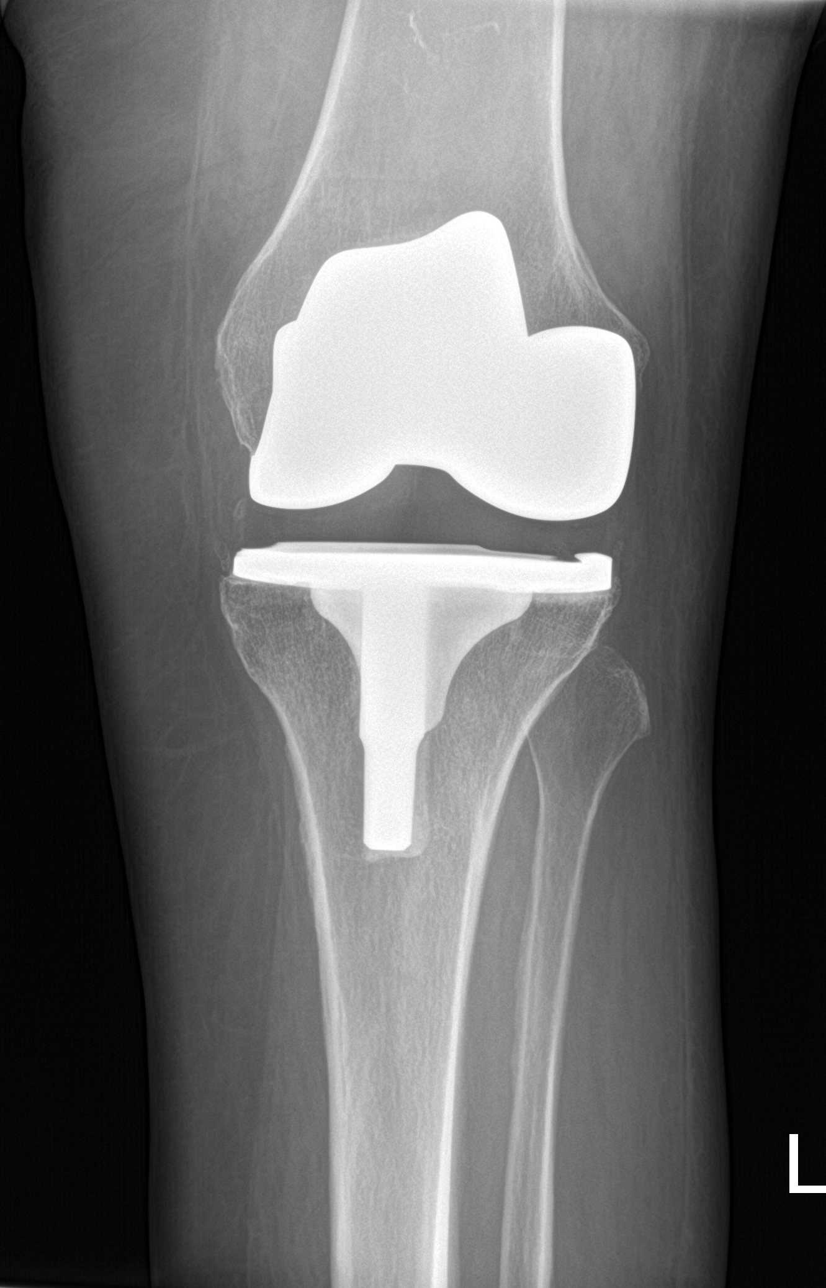

[knee lat]
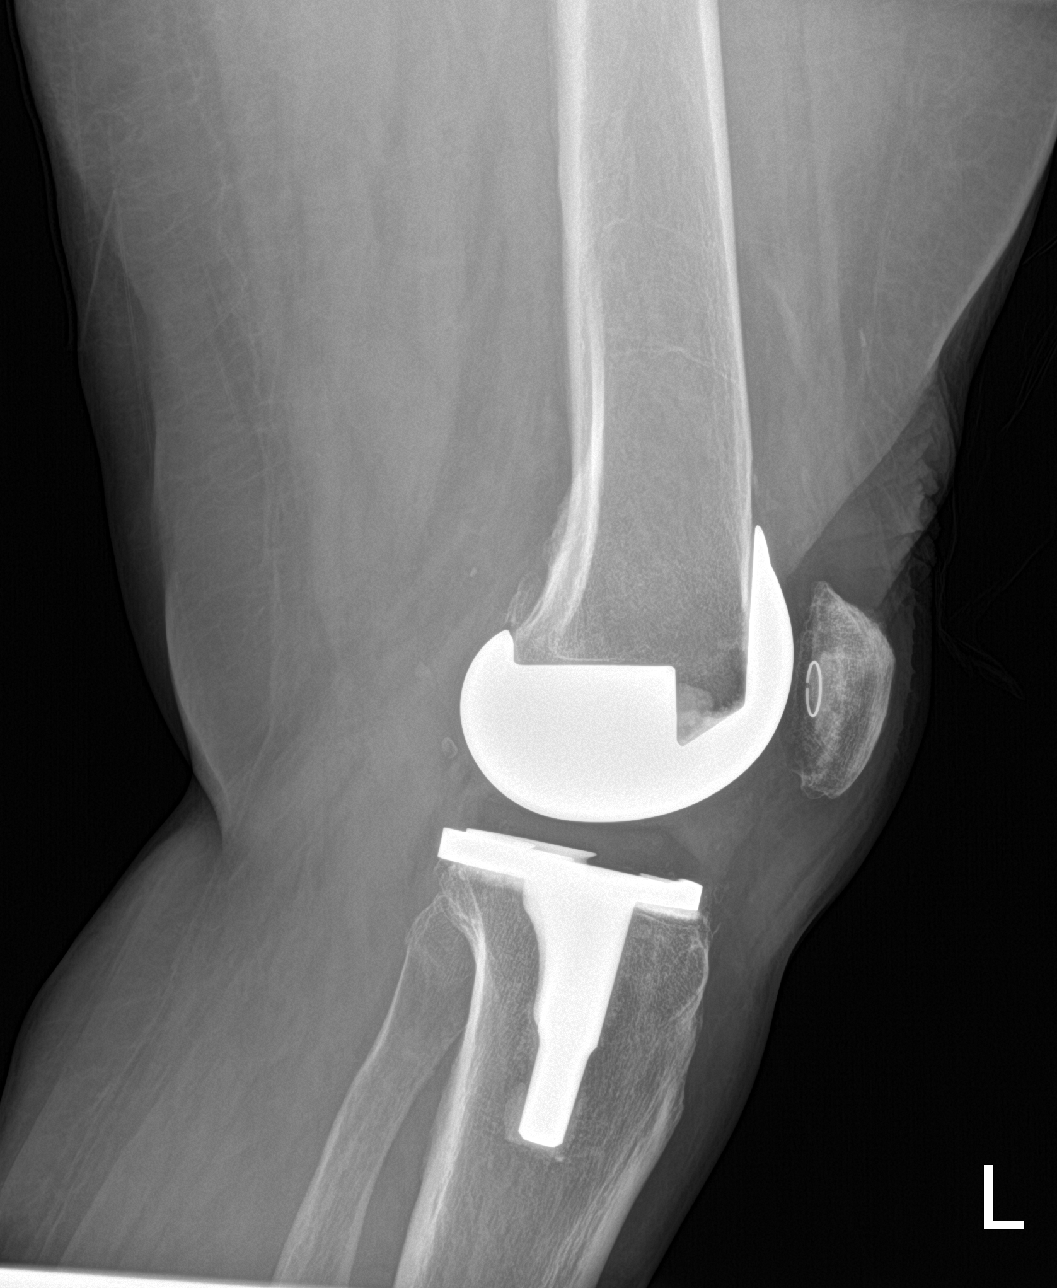

[knee obl (1 of 2)]
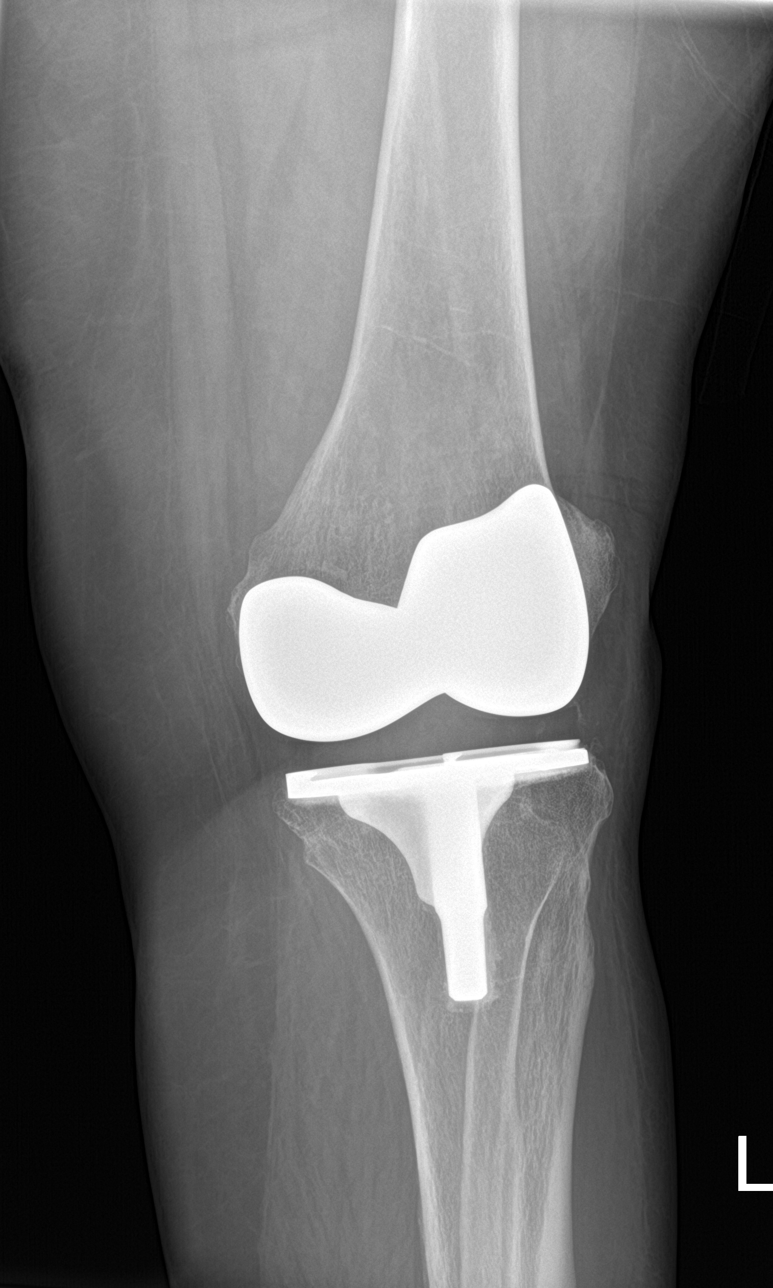

[knee obl (2 of 2)]
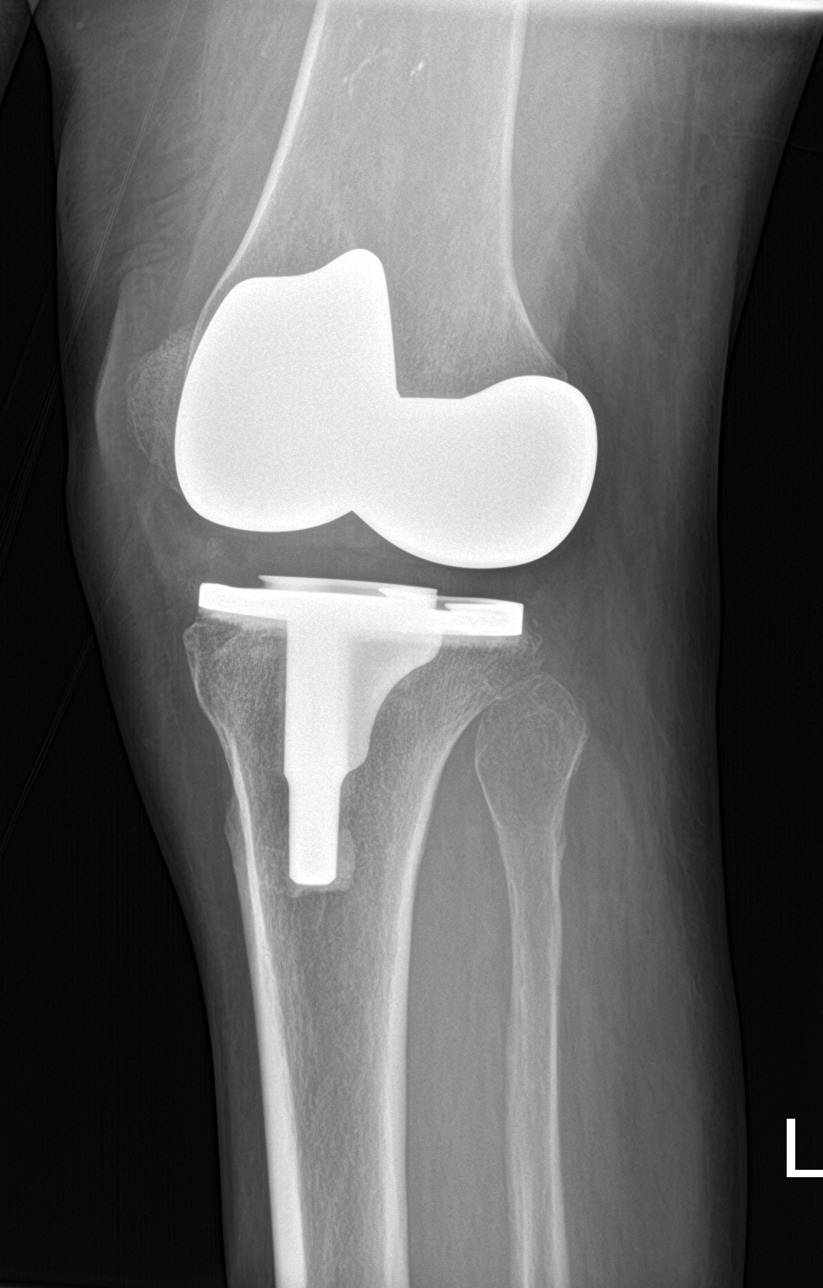

[4 of 4 positions shown; findings below may reference images not displayed]

FINDINGS: Postsurgical changes from prior total left knee arthroplasty with
posterior patellar resurfacing. No evidence of hardware loosening or
failure. Alignment of the prosthetic components is satisfactory. No
acute fracture or traumatic malalignment. Mild soft tissue
thickening seen anteriorly in the knee without sizable joint
effusion or other acute soft tissue abnormality
IMPRESSION: No acute osseous abnormality. Mild soft tissue swelling seen
anteriorly.

Prior total left knee arthroplasty without evidence of hardware
complication.

## 2020-06-18 IMAGING — DX DG HIP (WITH OR WITHOUT PELVIS) 2-3V LEFT
3 series · 3 of 3 positions shown · non-contrast
Comparison: None.

CLINICAL DATA: Fall

EXAM:
DG HIP (WITH OR WITHOUT PELVIS) 2-3V LEFT

[pelvis ap]
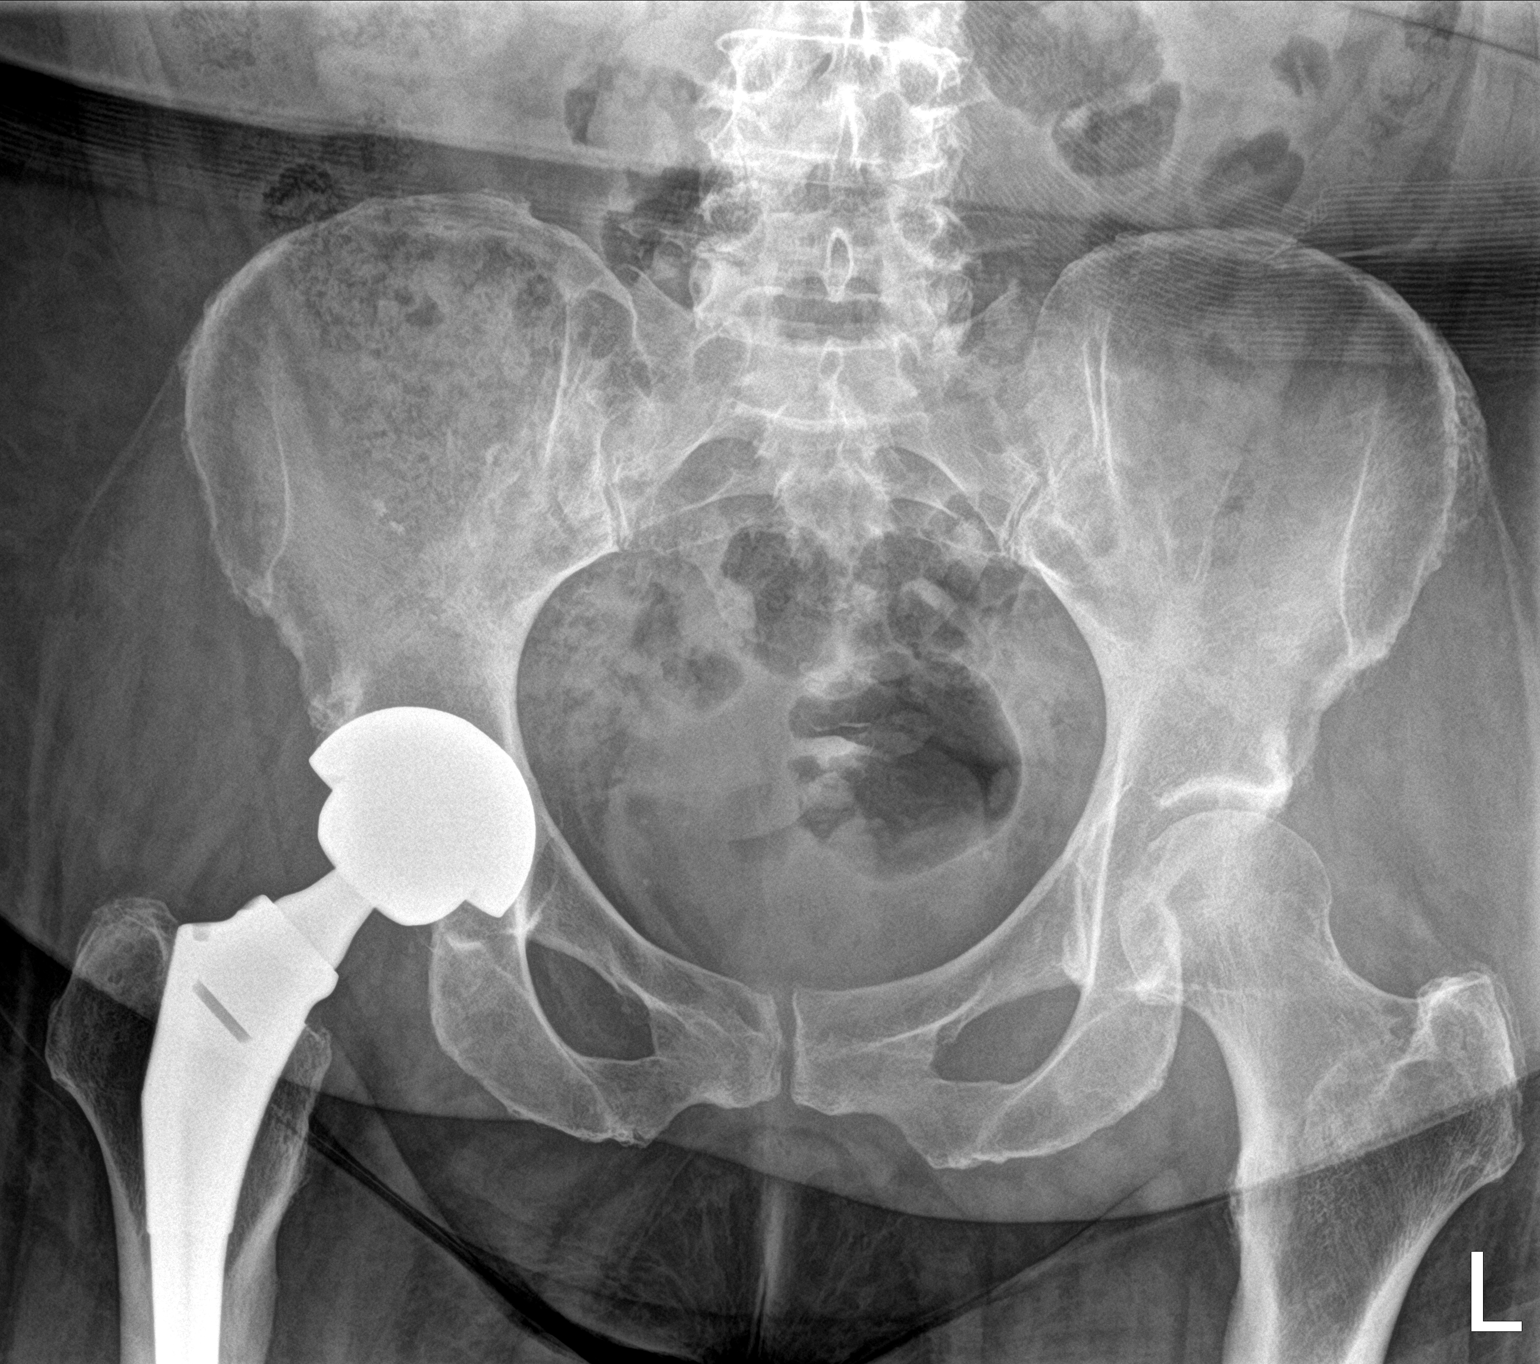

[hip ap]
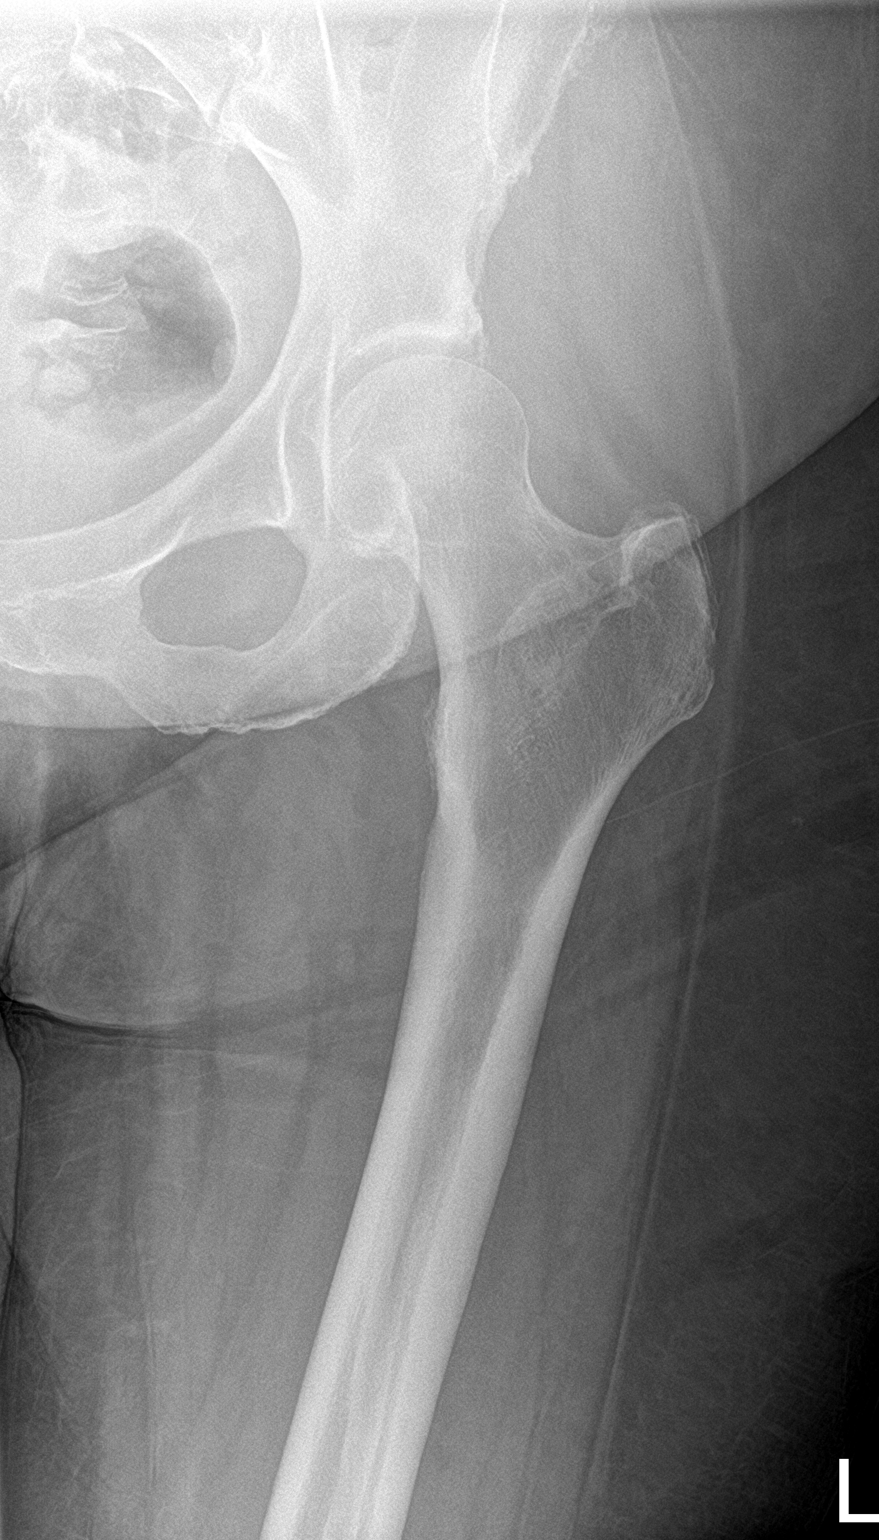

[hip lat]
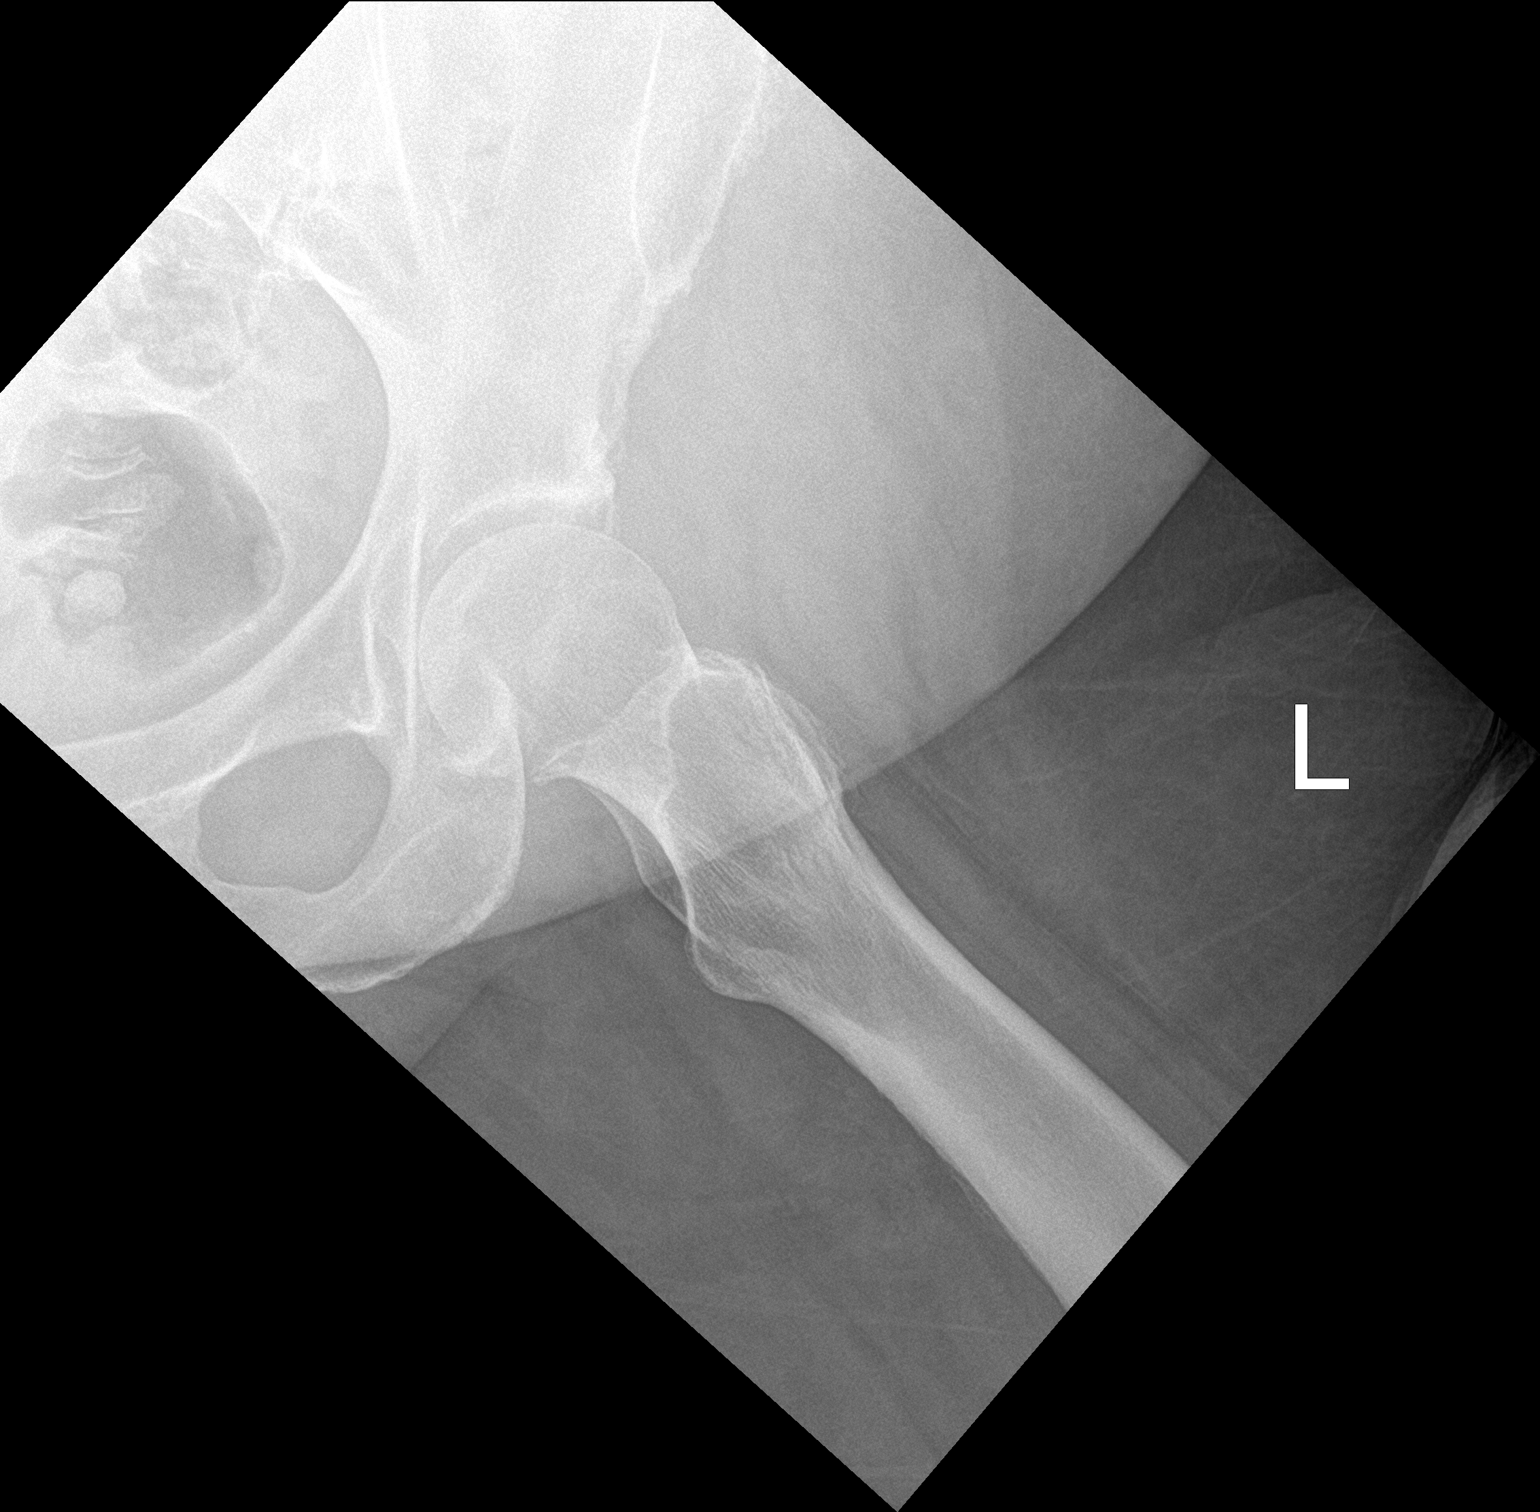

[3 of 3 positions shown; findings below may reference images not displayed]

FINDINGS: SI joints are non widened. Pubic symphysis is intact. Right hip
replacement with normal alignment. No fracture or dislocation.
IMPRESSION: No acute osseous abnormality

## 2020-07-03 IMAGING — DX PORTABLE CHEST - 1 VIEW
1 series · 1 of 1 positions shown · non-contrast
Comparison: PA and lateral chest 09/07/2018.

CLINICAL DATA: Vomiting, diarrhea and chills beginning yesterday.

EXAM:
PORTABLE CHEST 1 VIEW

[chest ap]
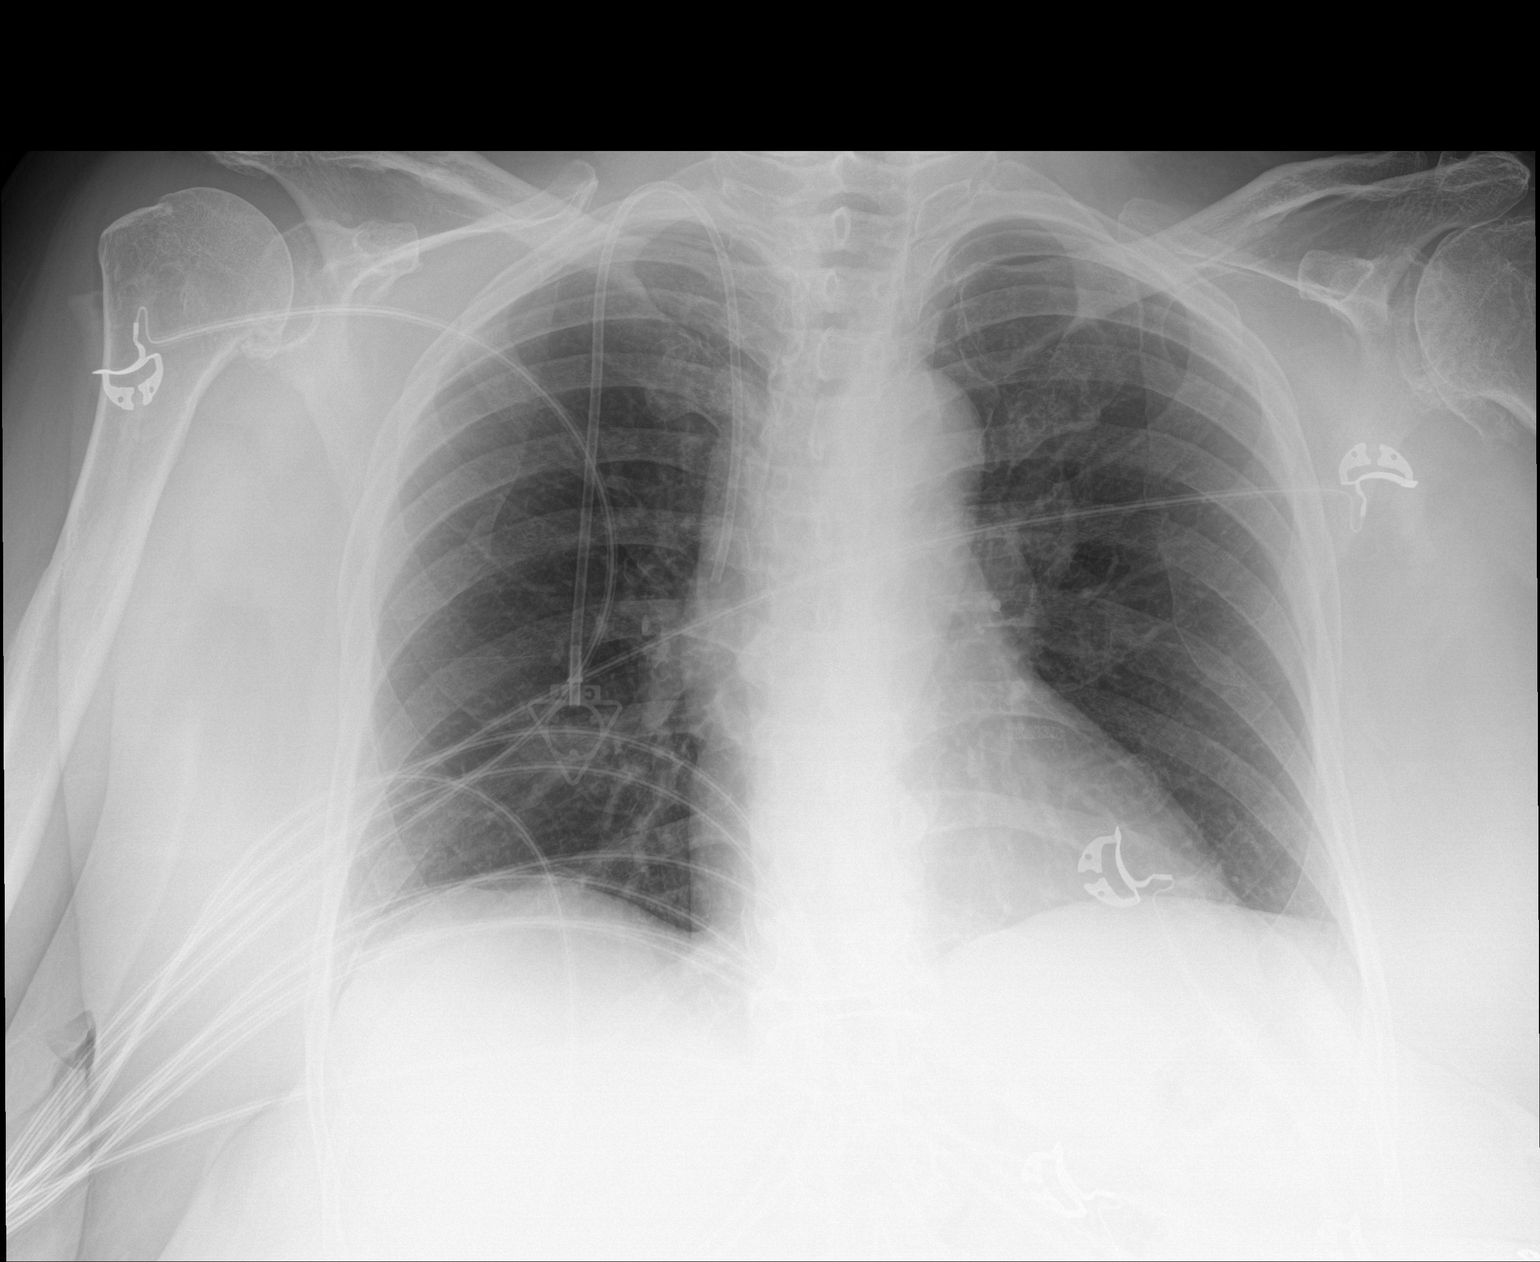

[1 of 1 positions shown; findings below may reference images not displayed]

FINDINGS: Right IJ Port-A-Cath is unchanged. Lungs clear. Heart size normal.
No pneumothorax or pleural fluid. No acute or bony abnormality.
IMPRESSION: No acute disease.

## 2020-07-03 IMAGING — CT CT ABDOMEN AND PELVIS WITH CONTRAST
2 of 5 series · 17 of 46 positions shown, 19 images · IV contrast (APPLIED)
Comparison: CT scan of December 17, 2016.

CLINICAL DATA: Acute abdominal pain, nausea, vomiting.

EXAM:
CT ABDOMEN AND PELVIS WITH CONTRAST
TECHNIQUE: Multidetector CT imaging of the abdomen and pelvis was performed
using the standard protocol following bolus administration of
intravenous contrast.
CONTRAST:  100mL OMNIPAQUE IOHEXOL 300 MG/ML  SOLN

[Series 2: axial st · axial · 0.98mm/px · z∈[-585,-160]mm · 14 of 95 slices shown, 16 images]
[im 5/95  soft-tissue]
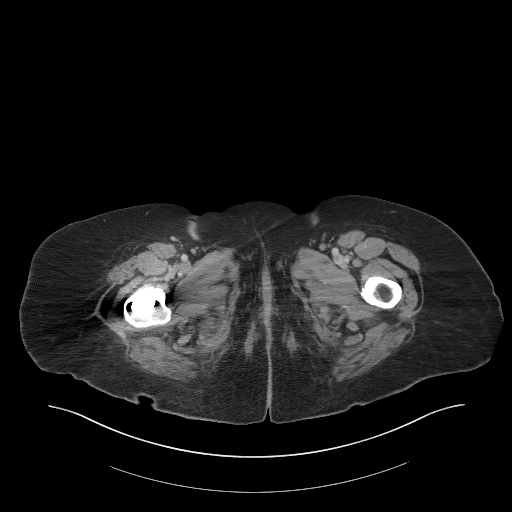
[im 5/95  bone]
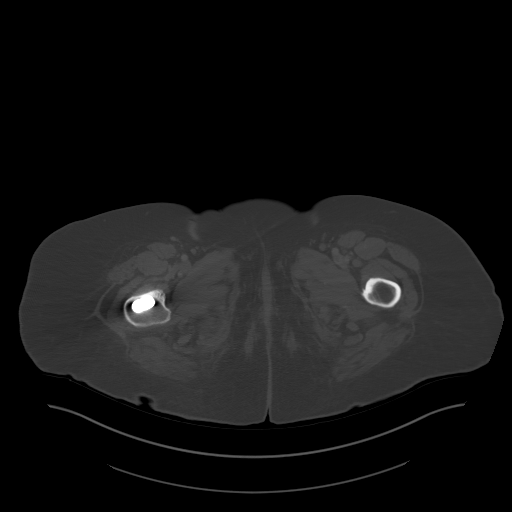
[im 10/95  soft-tissue]
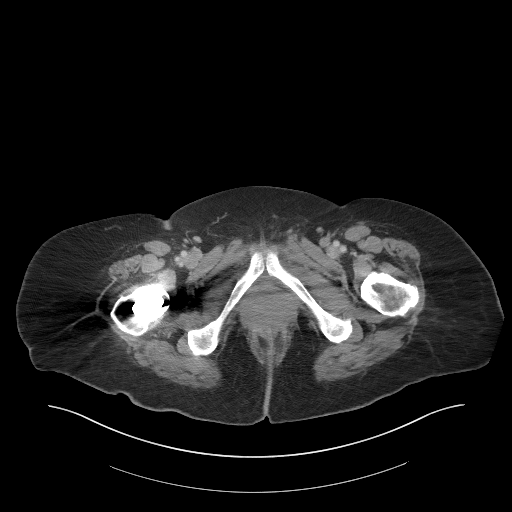
[im 20/95  soft-tissue]
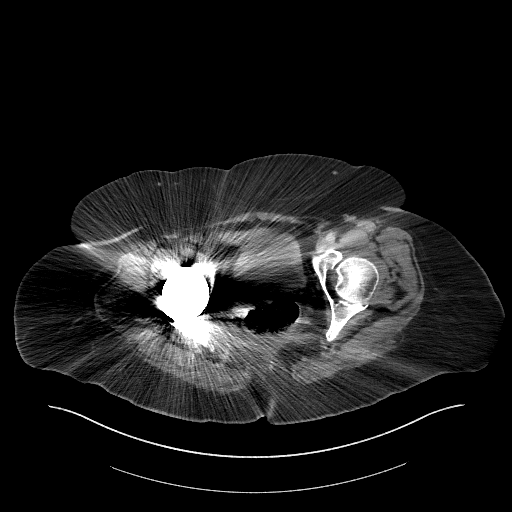
[im 25/95  soft-tissue]
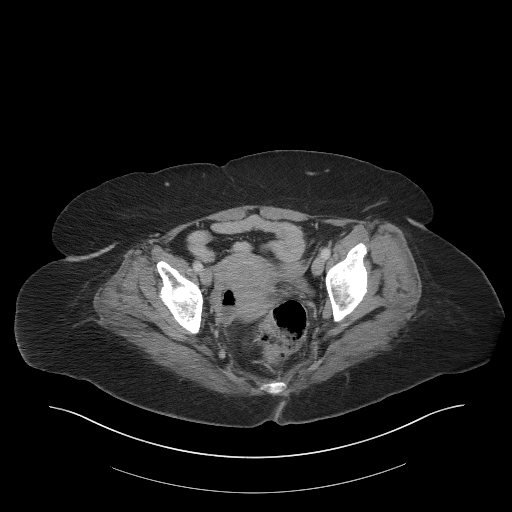
[im 30/95  soft-tissue]
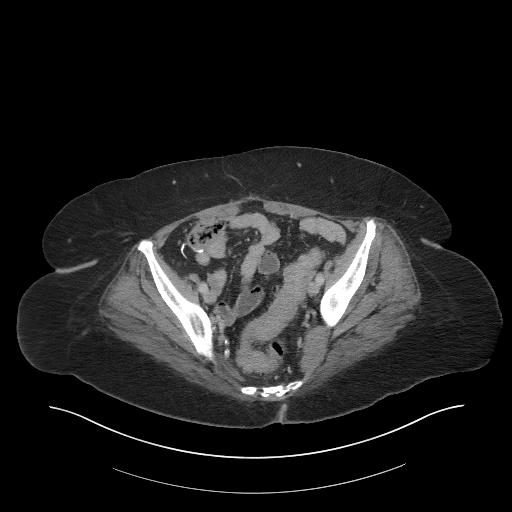
[im 40/95  soft-tissue]
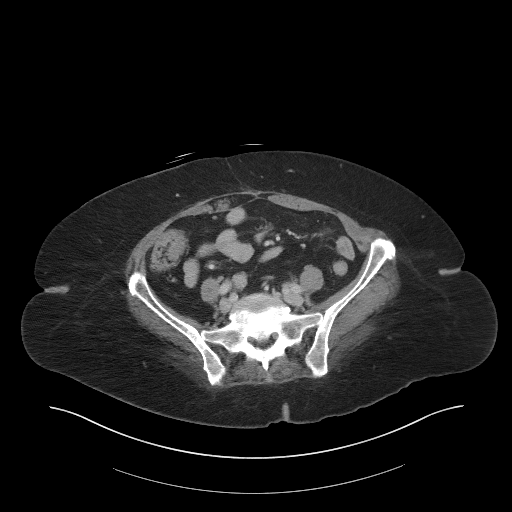
[im 45/95  soft-tissue]
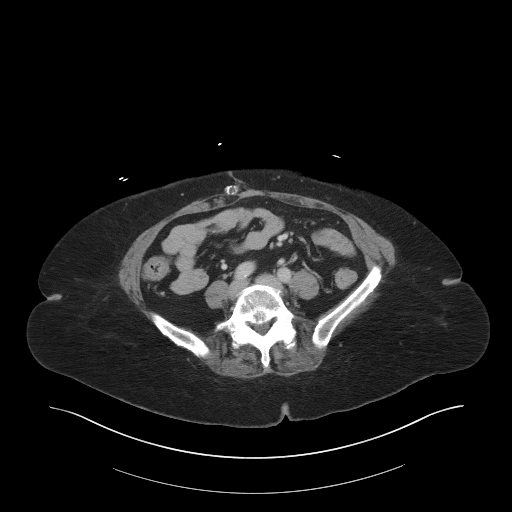
[im 50/95  soft-tissue]
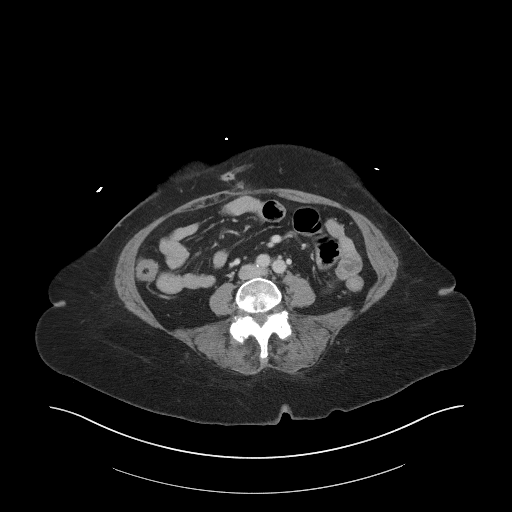
[im 55/95  soft-tissue]
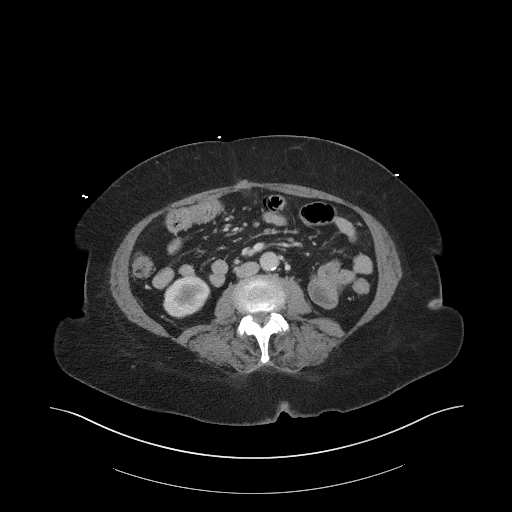
[im 55/95  bone]
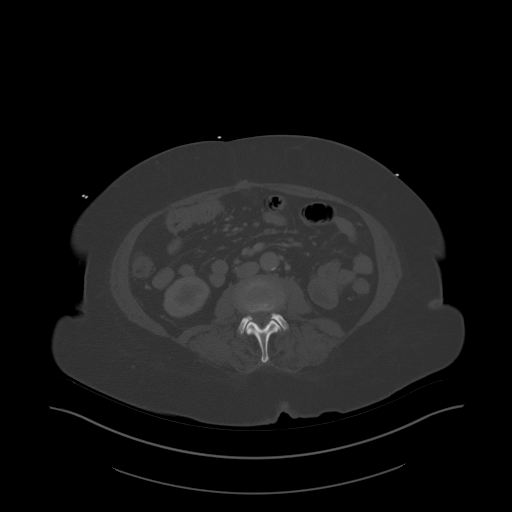
[im 65/95  soft-tissue]
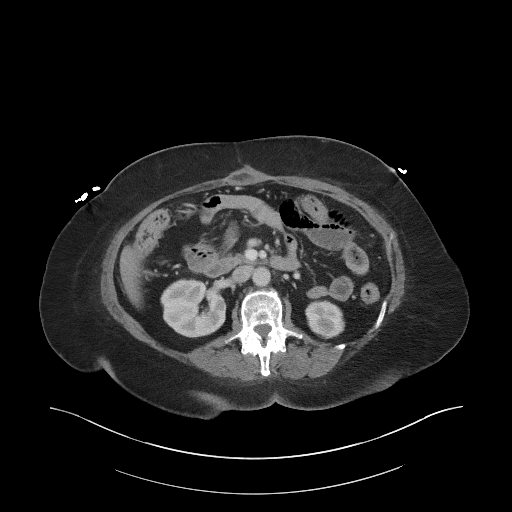
[im 70/95  soft-tissue]
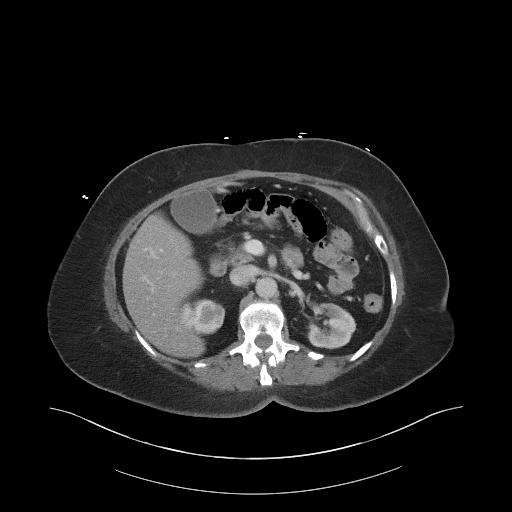
[im 75/95  soft-tissue]
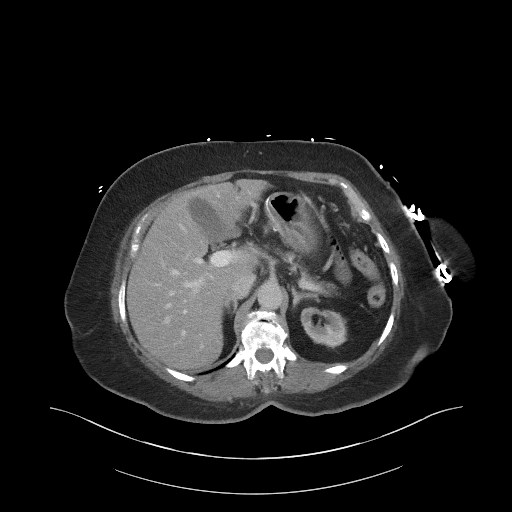
[im 85/95  soft-tissue]
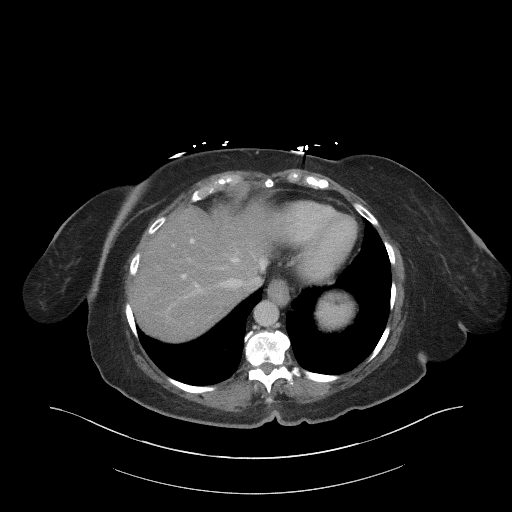
[im 90/95  soft-tissue]
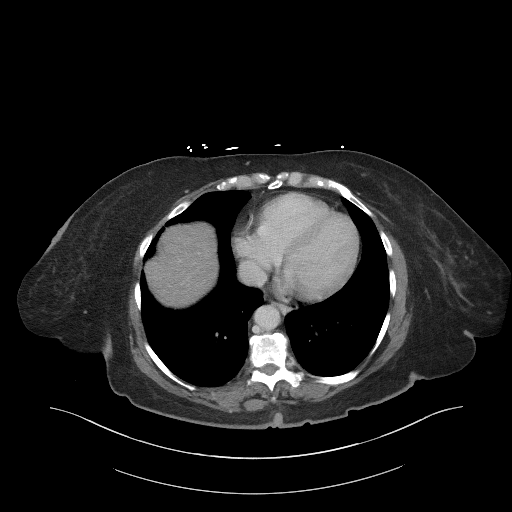

[Series 5: coronal st · coronal · 0.97mm/px · 3 of 92 slices shown]
[im 31/92  soft-tissue]
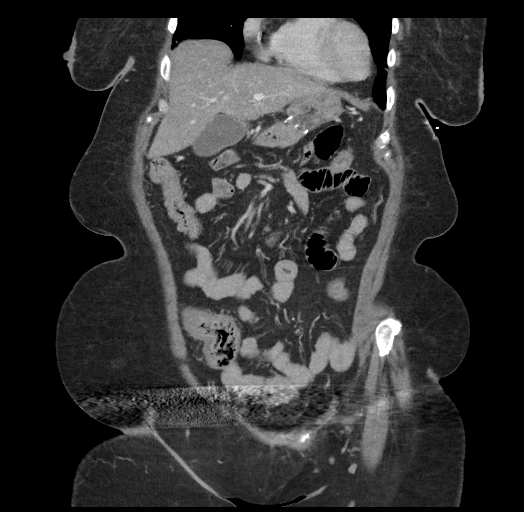
[im 41/92  soft-tissue]
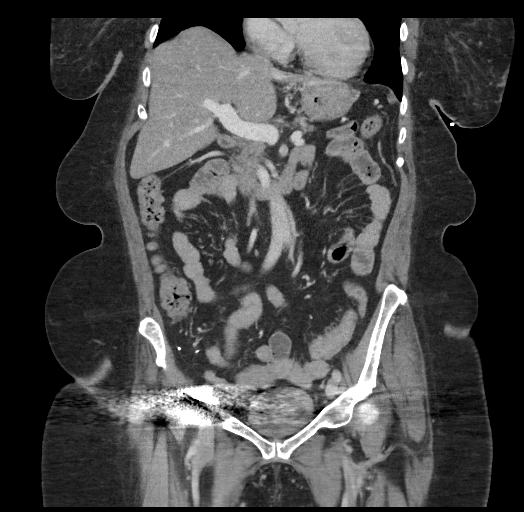
[im 51/92  soft-tissue]
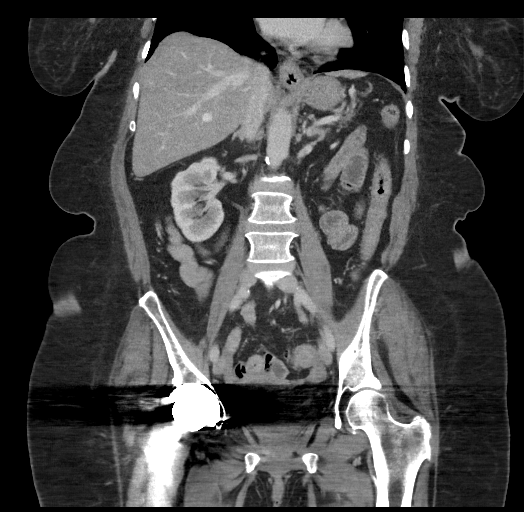

[17 of 46 positions shown; findings below may reference images not displayed]

FINDINGS: Lower chest: No acute abnormality.

Hepatobiliary: No focal liver abnormality is seen. No gallstones,
gallbladder wall thickening, or biliary dilatation.

Pancreas: Pancreatic atrophy is again noted. No acute abnormality or
ductal dilatation is noted.

Spleen: Normal in size without focal abnormality.

Adrenals/Urinary Tract: Adrenal glands appear normal. Right kidney
is unremarkable. Cortical thinning of upper pole of left kidney is
noted with overall mild left renal atrophy. No hydronephrosis or
renal obstruction is noted. No renal or ureteral calculi are noted.
Bladder is unremarkable.

Stomach/Bowel: Status post partial gastrectomy and
gastrojejunostomy. There is no evidence of bowel obstruction or
ileus. Status post appendectomy. Sigmoid diverticulosis is noted
without inflammation.

Vascular/Lymphatic: Aortic atherosclerosis. No enlarged abdominal or
pelvic lymph nodes.

Reproductive: Uterus and bilateral adnexa are unremarkable.

Other: No abdominal wall hernia or abnormality. No abdominopelvic
ascites.

Musculoskeletal: Status post right hip arthroplasty. No acute
abnormality is noted.
IMPRESSION: Status post partial gastrectomy and gastrojejunostomy.

Sigmoid diverticulosis without inflammation.

Stable left renal atrophy.

No acute abnormality seen in the abdomen or pelvis.

Aortic Atherosclerosis ([id number]).

## 2020-08-09 IMAGING — DX DG KNEE COMPLETE 4+V*L*
4 series · 4 of 4 positions shown · non-contrast
Comparison: 03/07/2019

CLINICAL DATA: Fall, left knee pain.

EXAM:
LEFT KNEE - COMPLETE 4+ VIEW

[knee ap]
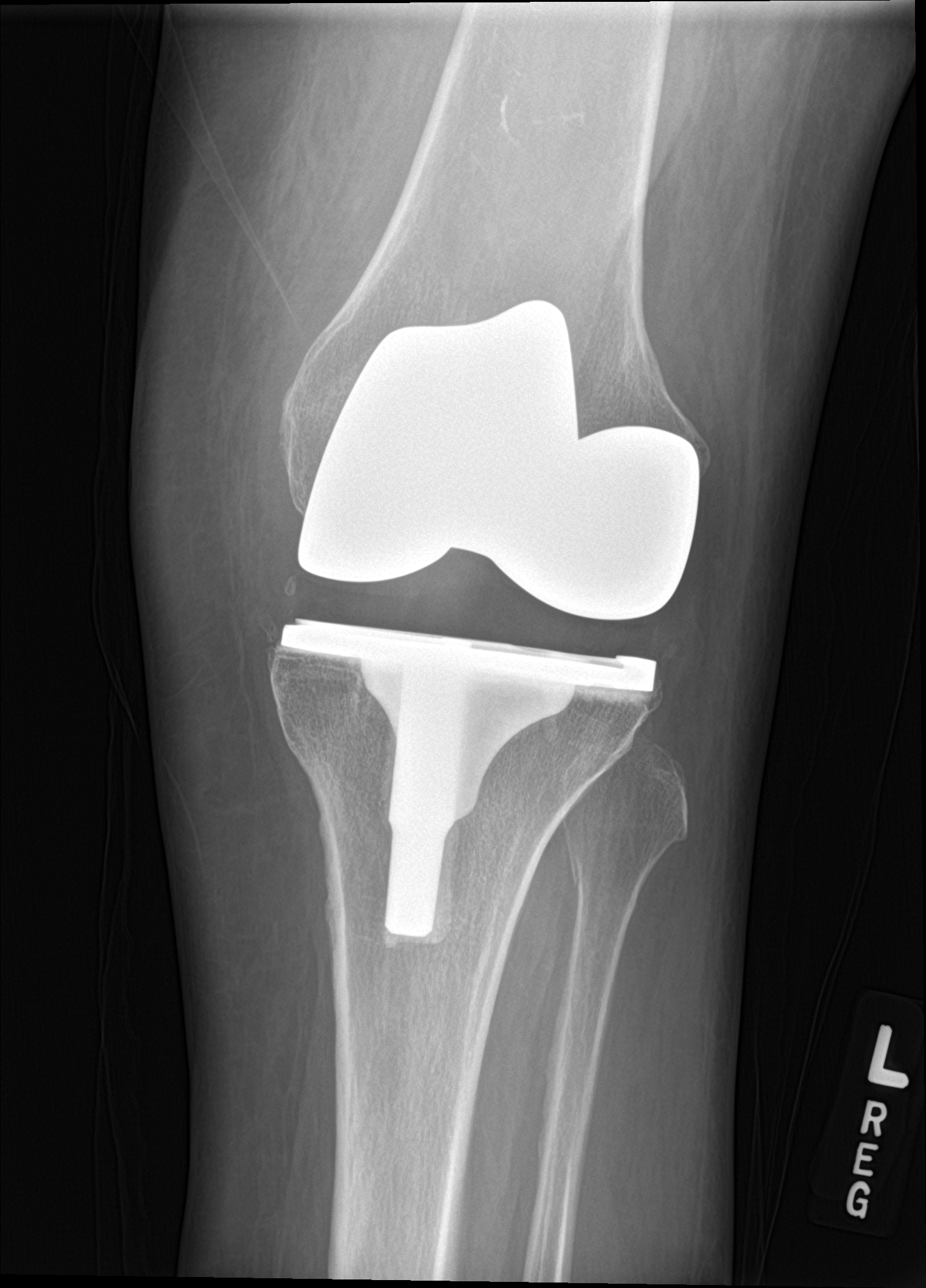

[tunnel]
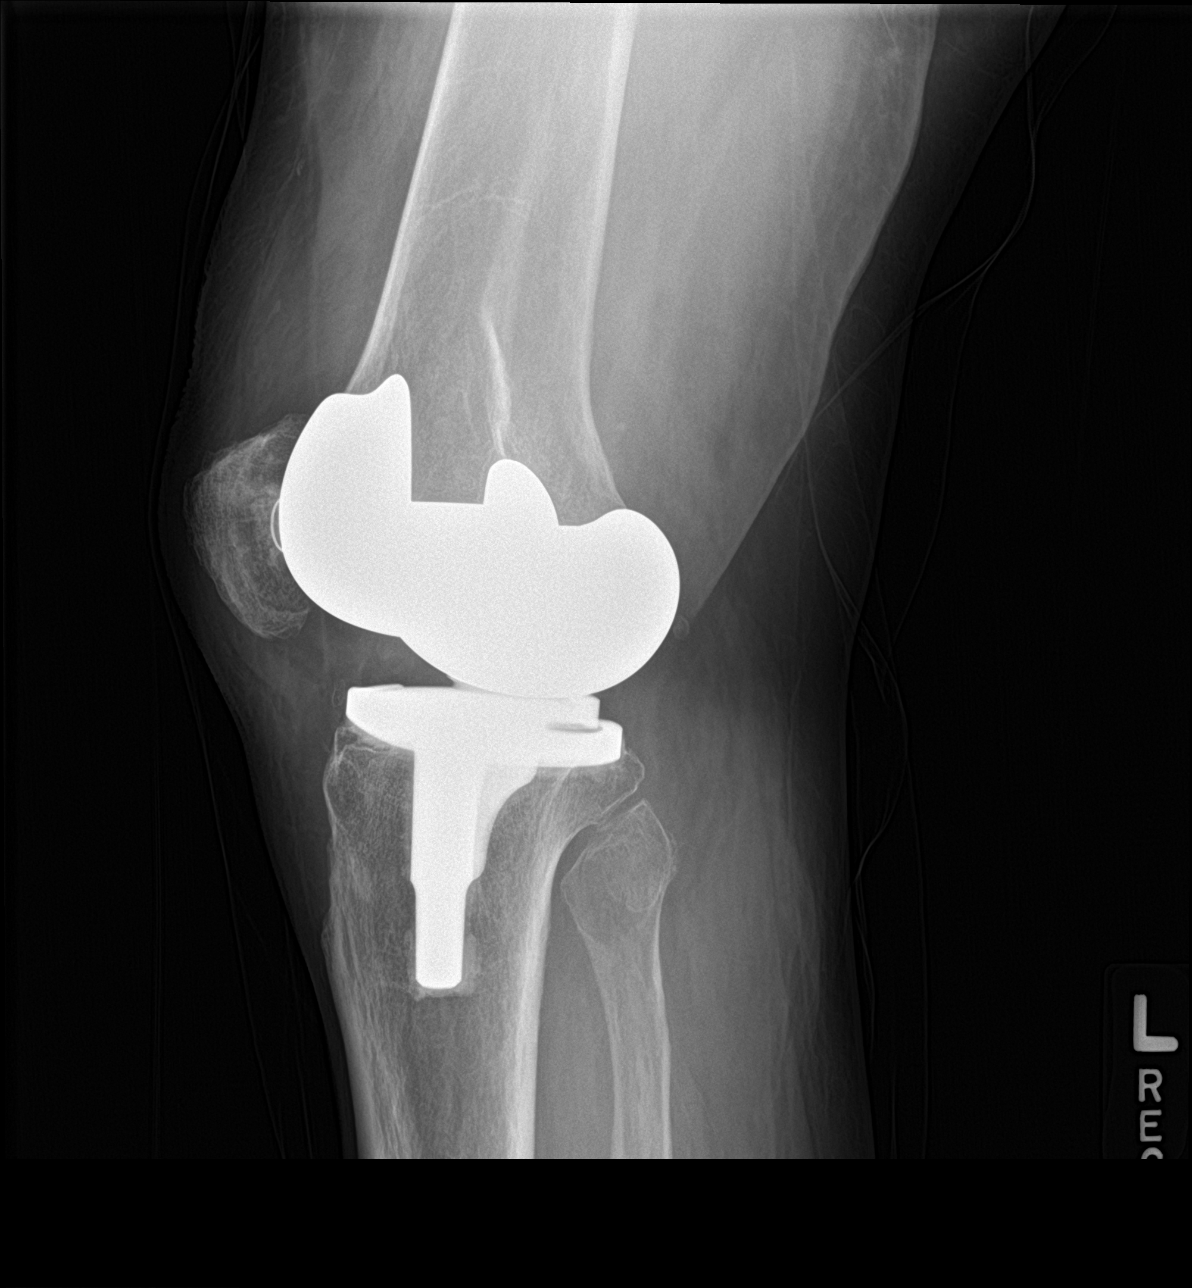

[knee lat]
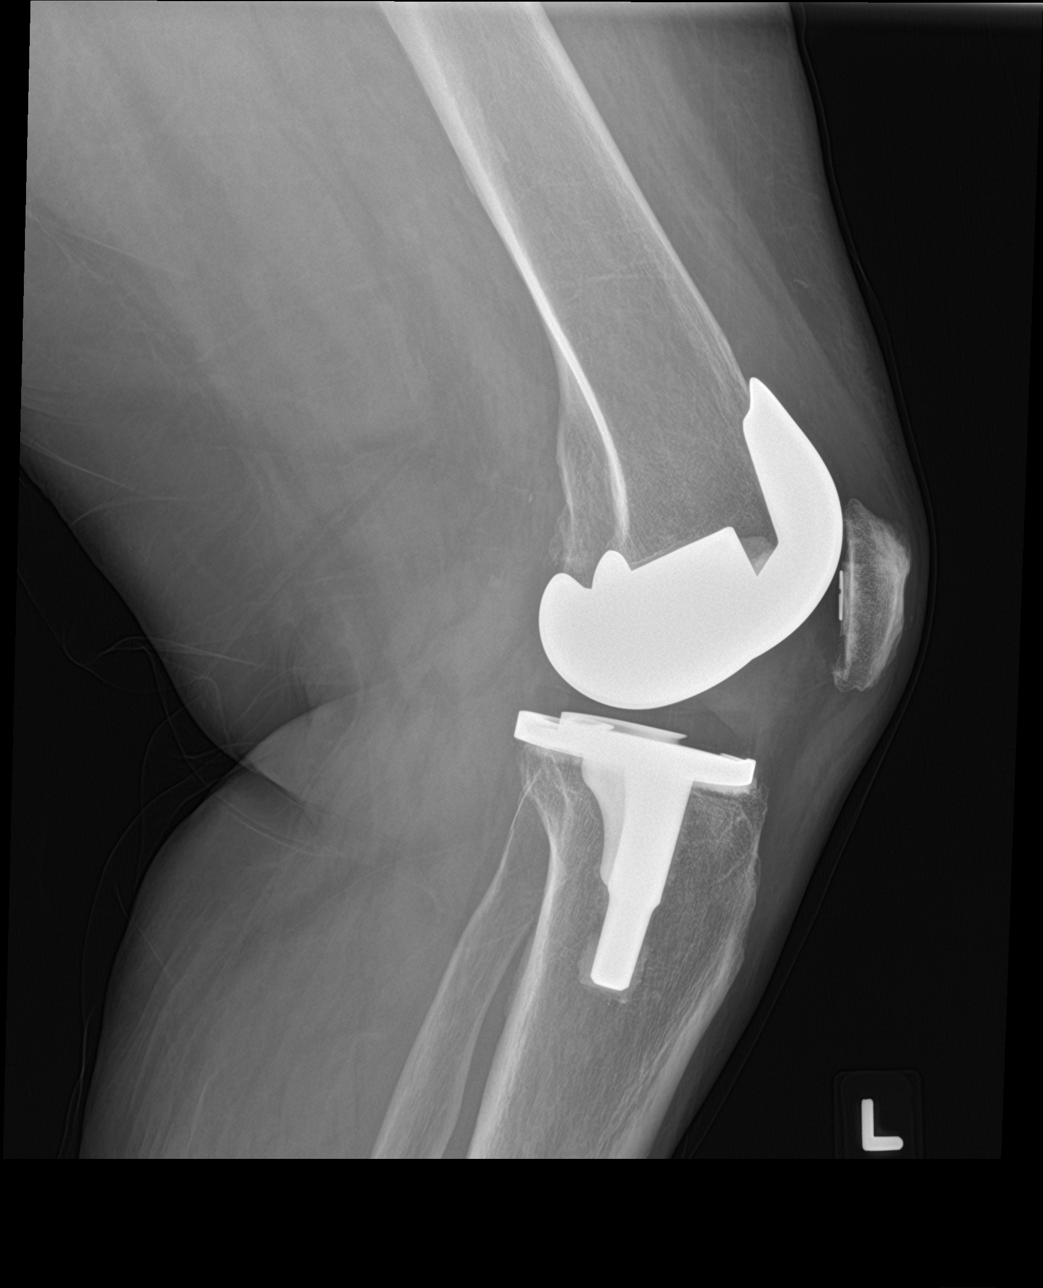

[knee obl]
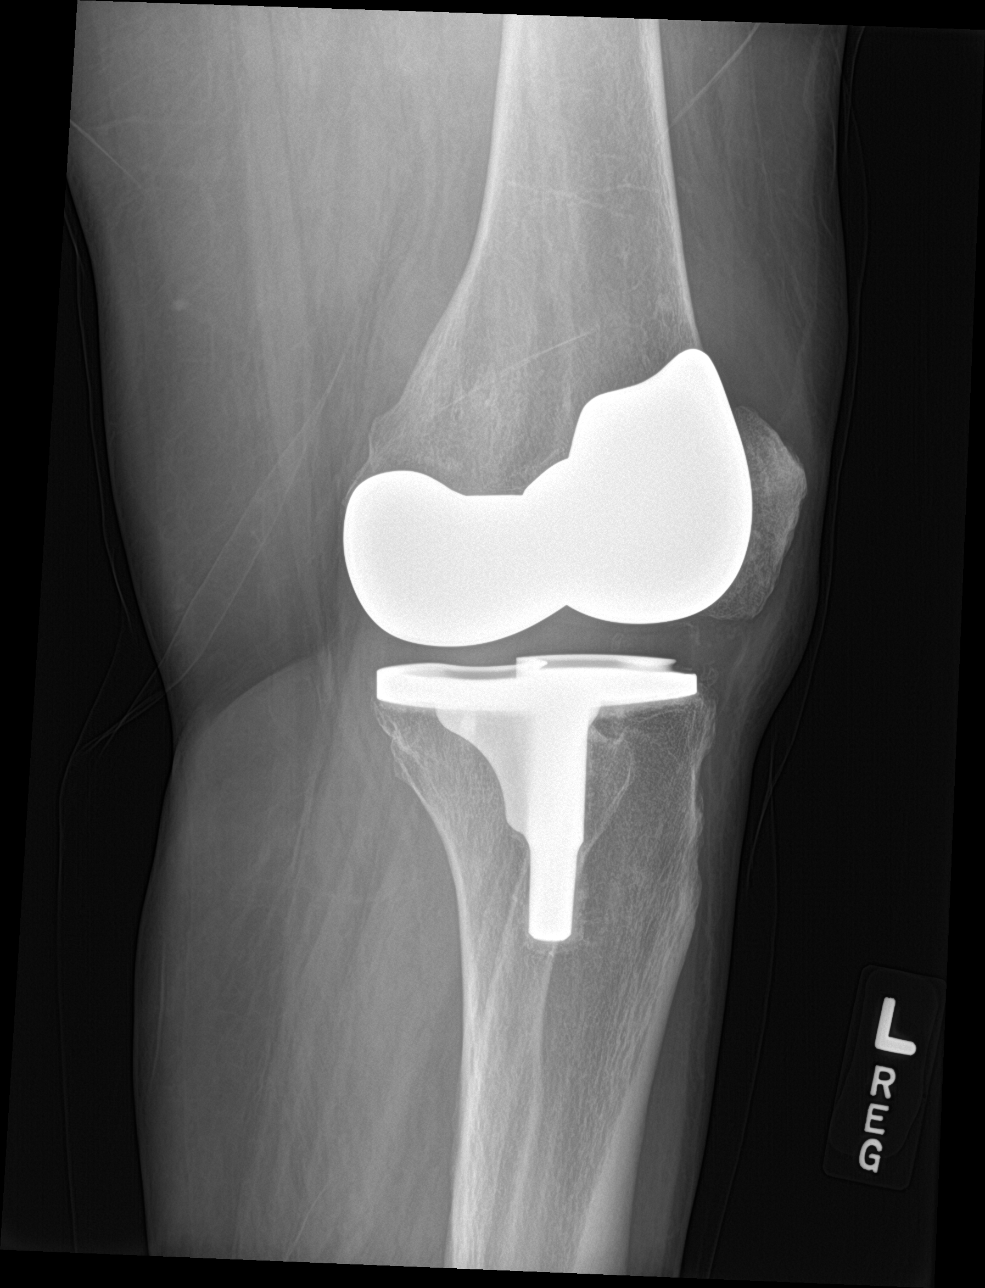

[4 of 4 positions shown; findings below may reference images not displayed]

FINDINGS: No periprosthetic fracture in the femur or tibia. There is some mild
cortical discontinuity in the proximal fibular metaphysis which
appears increased on the frontal projection compared 03/07/2019
suspicious for a nondisplaced proximal fibular fracture. Expected
postoperative appearance of the patella.
IMPRESSION: 1. Subtle cortical discontinuity along the proximal fibular
metaphysis suspicious for a nondisplaced fracture on the frontal
projection.
2. No tibial or femoral fracture, or specific complicating feature
of the prosthetic identified.

## 2020-08-23 IMAGING — DX DG CHEST 2V
2 series · 2 of 2 positions shown · non-contrast
Comparison: None.

CLINICAL DATA: Chest pain

EXAM:
CHEST - 2 VIEW

[chest lat]
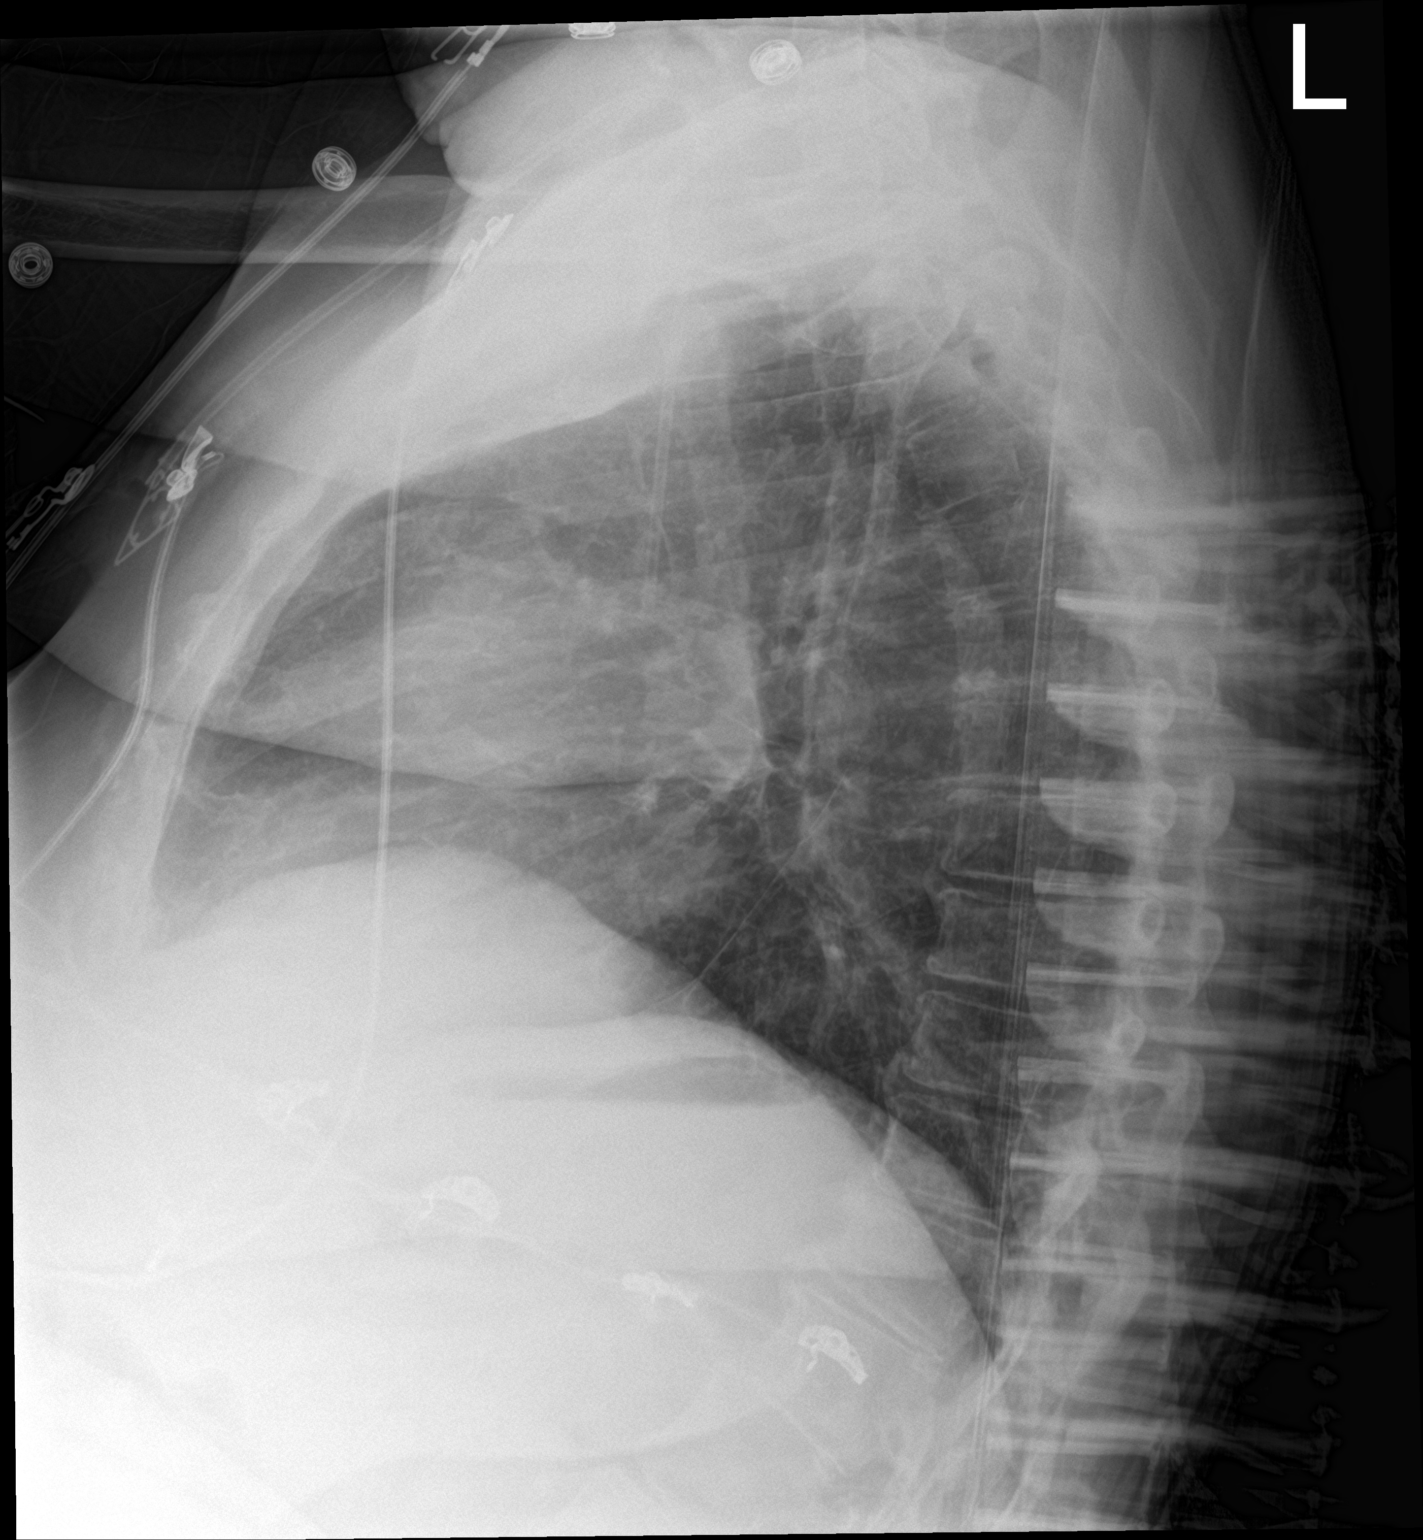

[chest ap]
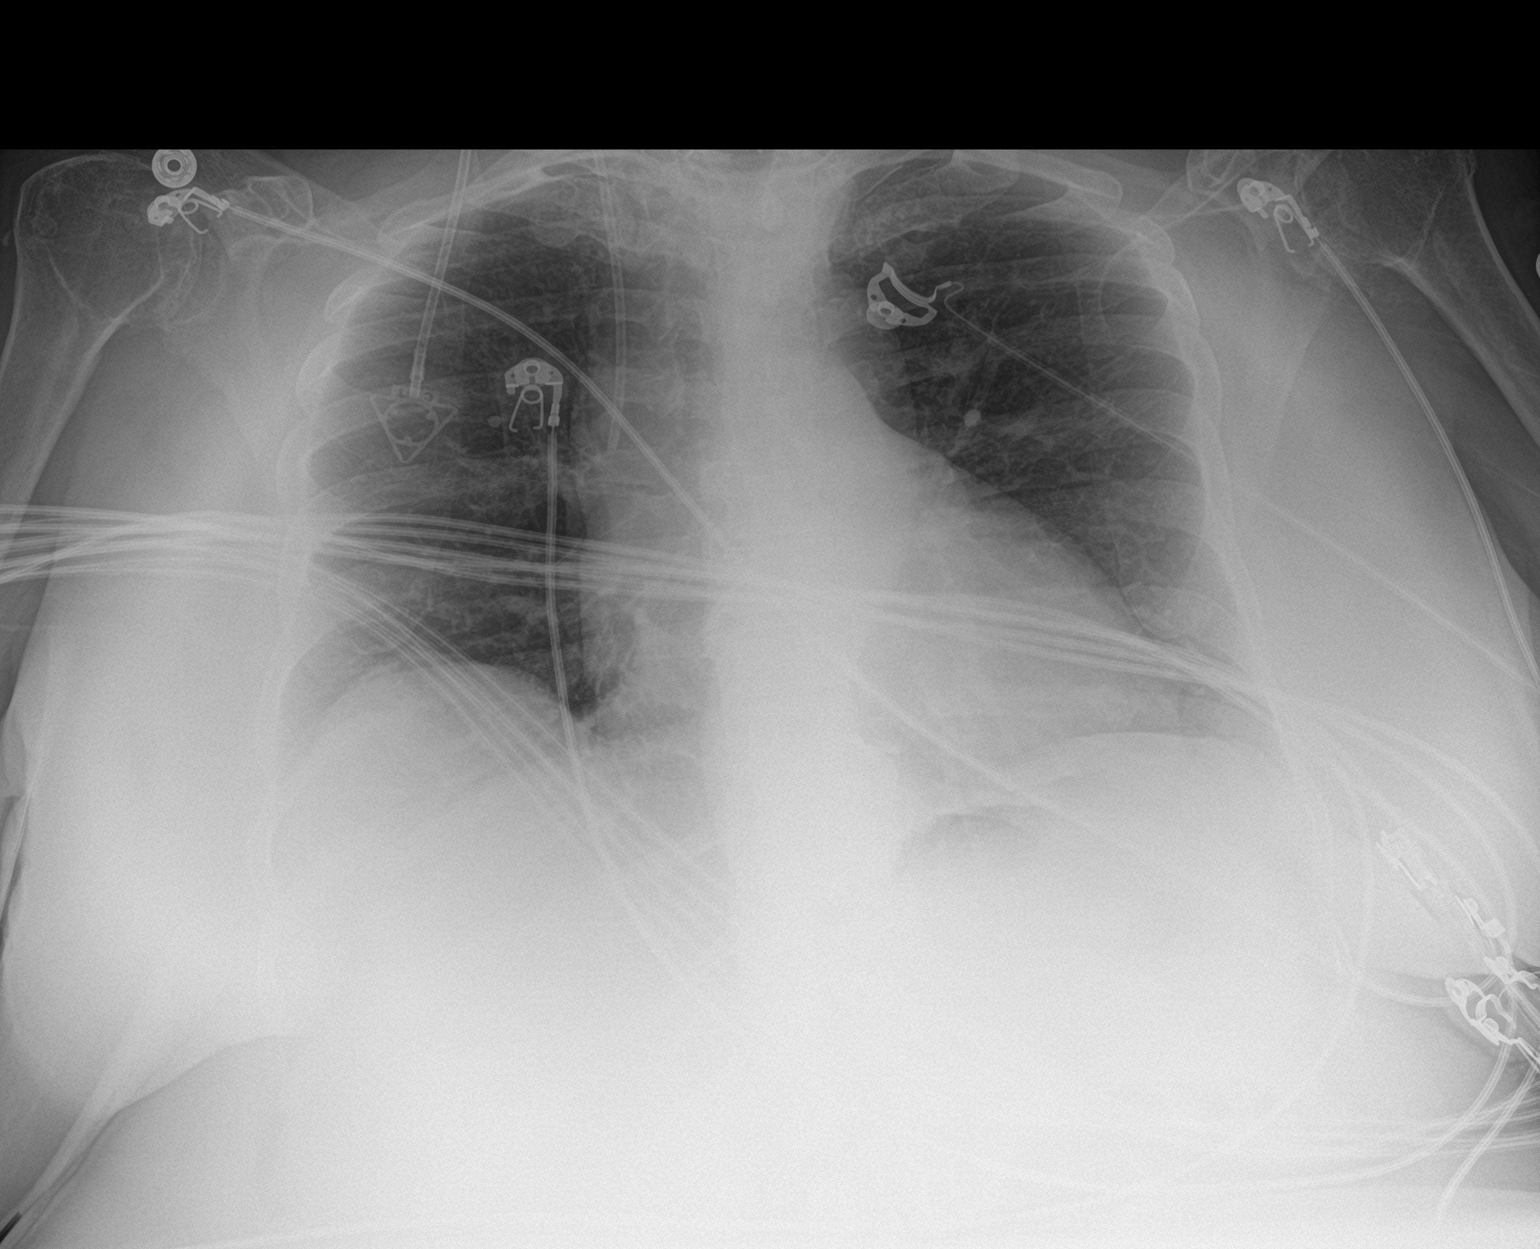

[2 of 2 positions shown; findings below may reference images not displayed]

FINDINGS: The heart size and mediastinal contours are within normal limits. A
right-sided MediPort catheter seen with the tip in mid SVC. Both
lungs are clear. The visualized skeletal structures are
unremarkable.
IMPRESSION: No acute cardiopulmonary process.

## 2020-08-25 IMAGING — CR DG CHEST 2V
2 series · 2 of 2 positions shown · non-contrast
Comparison: None.

CLINICAL DATA: Fever.

EXAM:
CHEST - 2 VIEW

[chest pa]
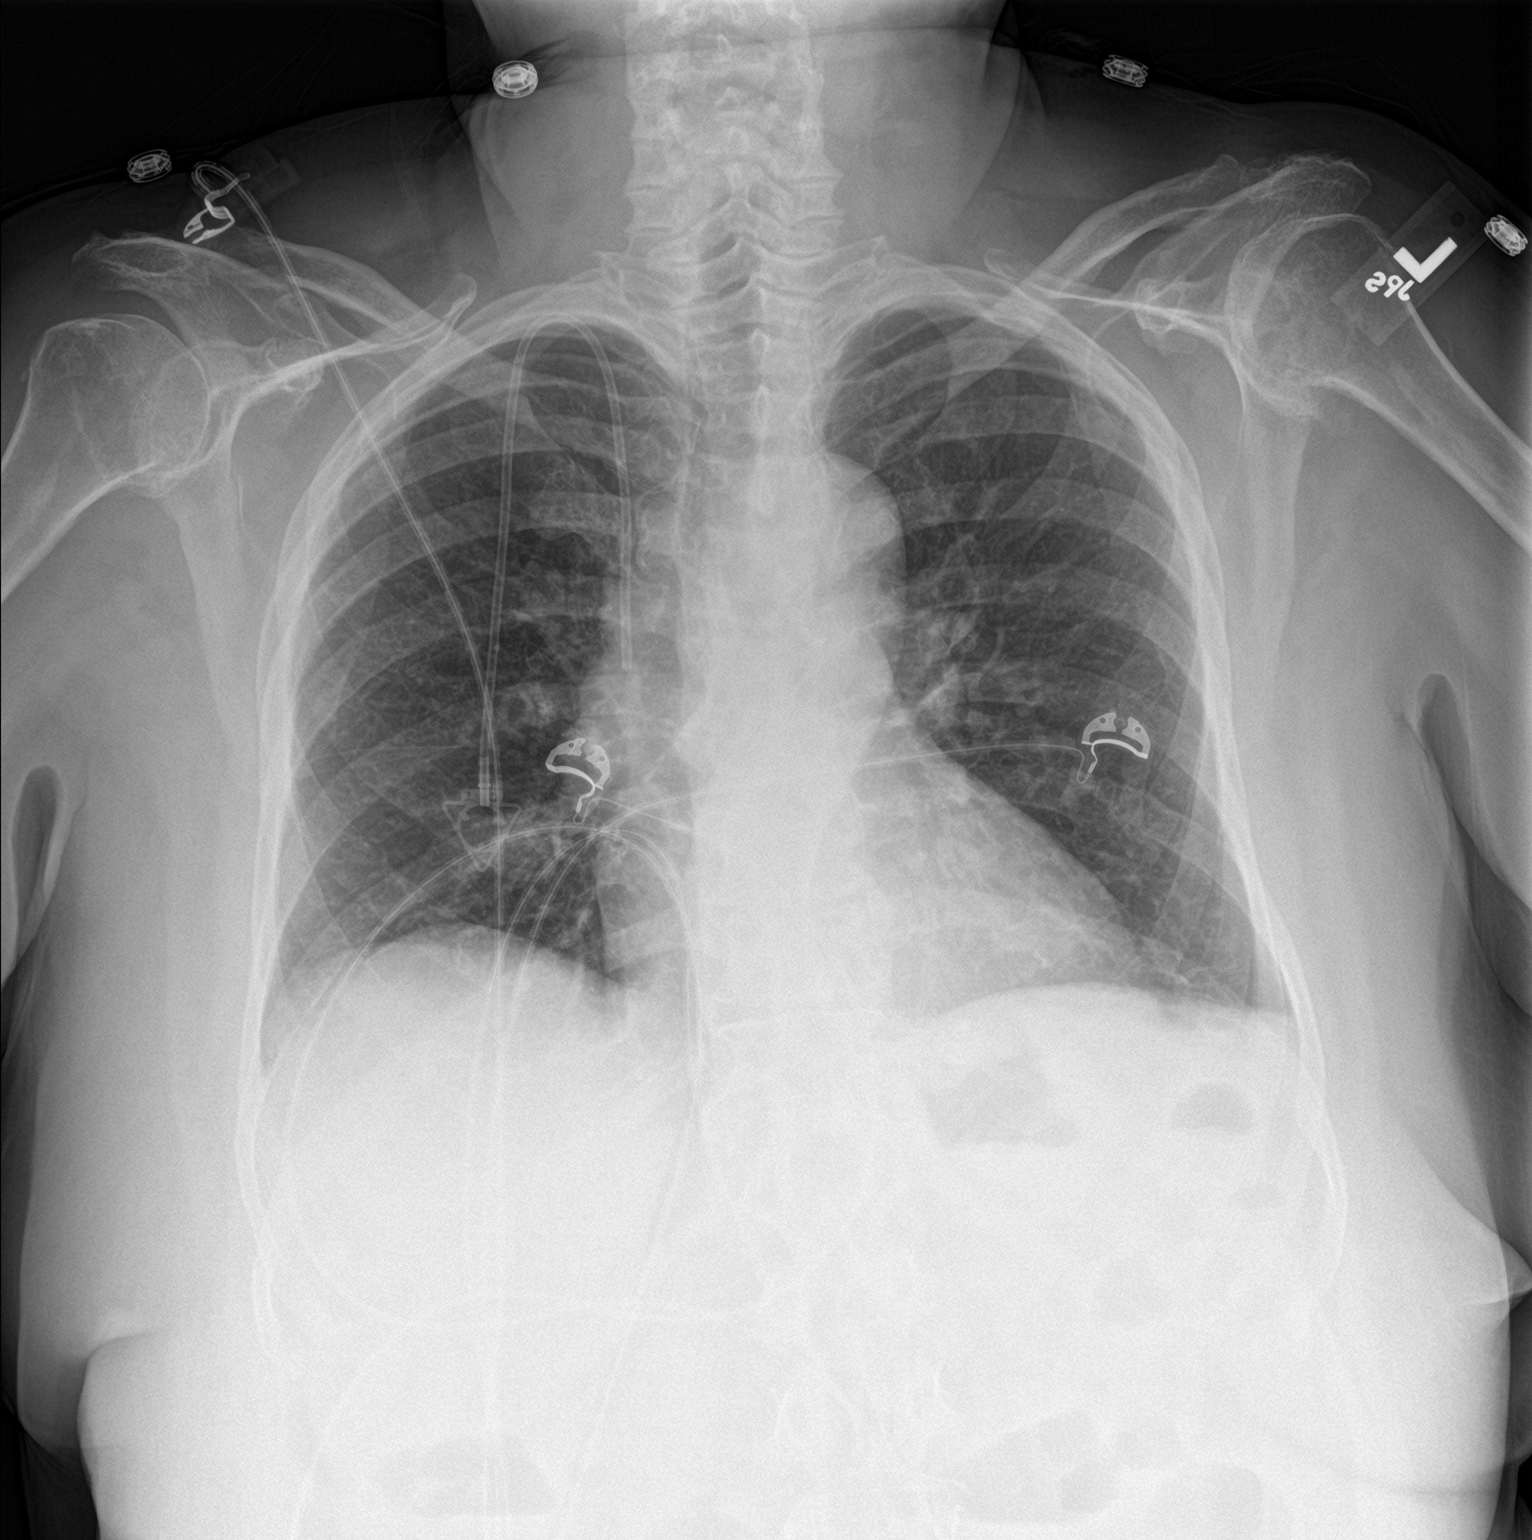

[chest lat]
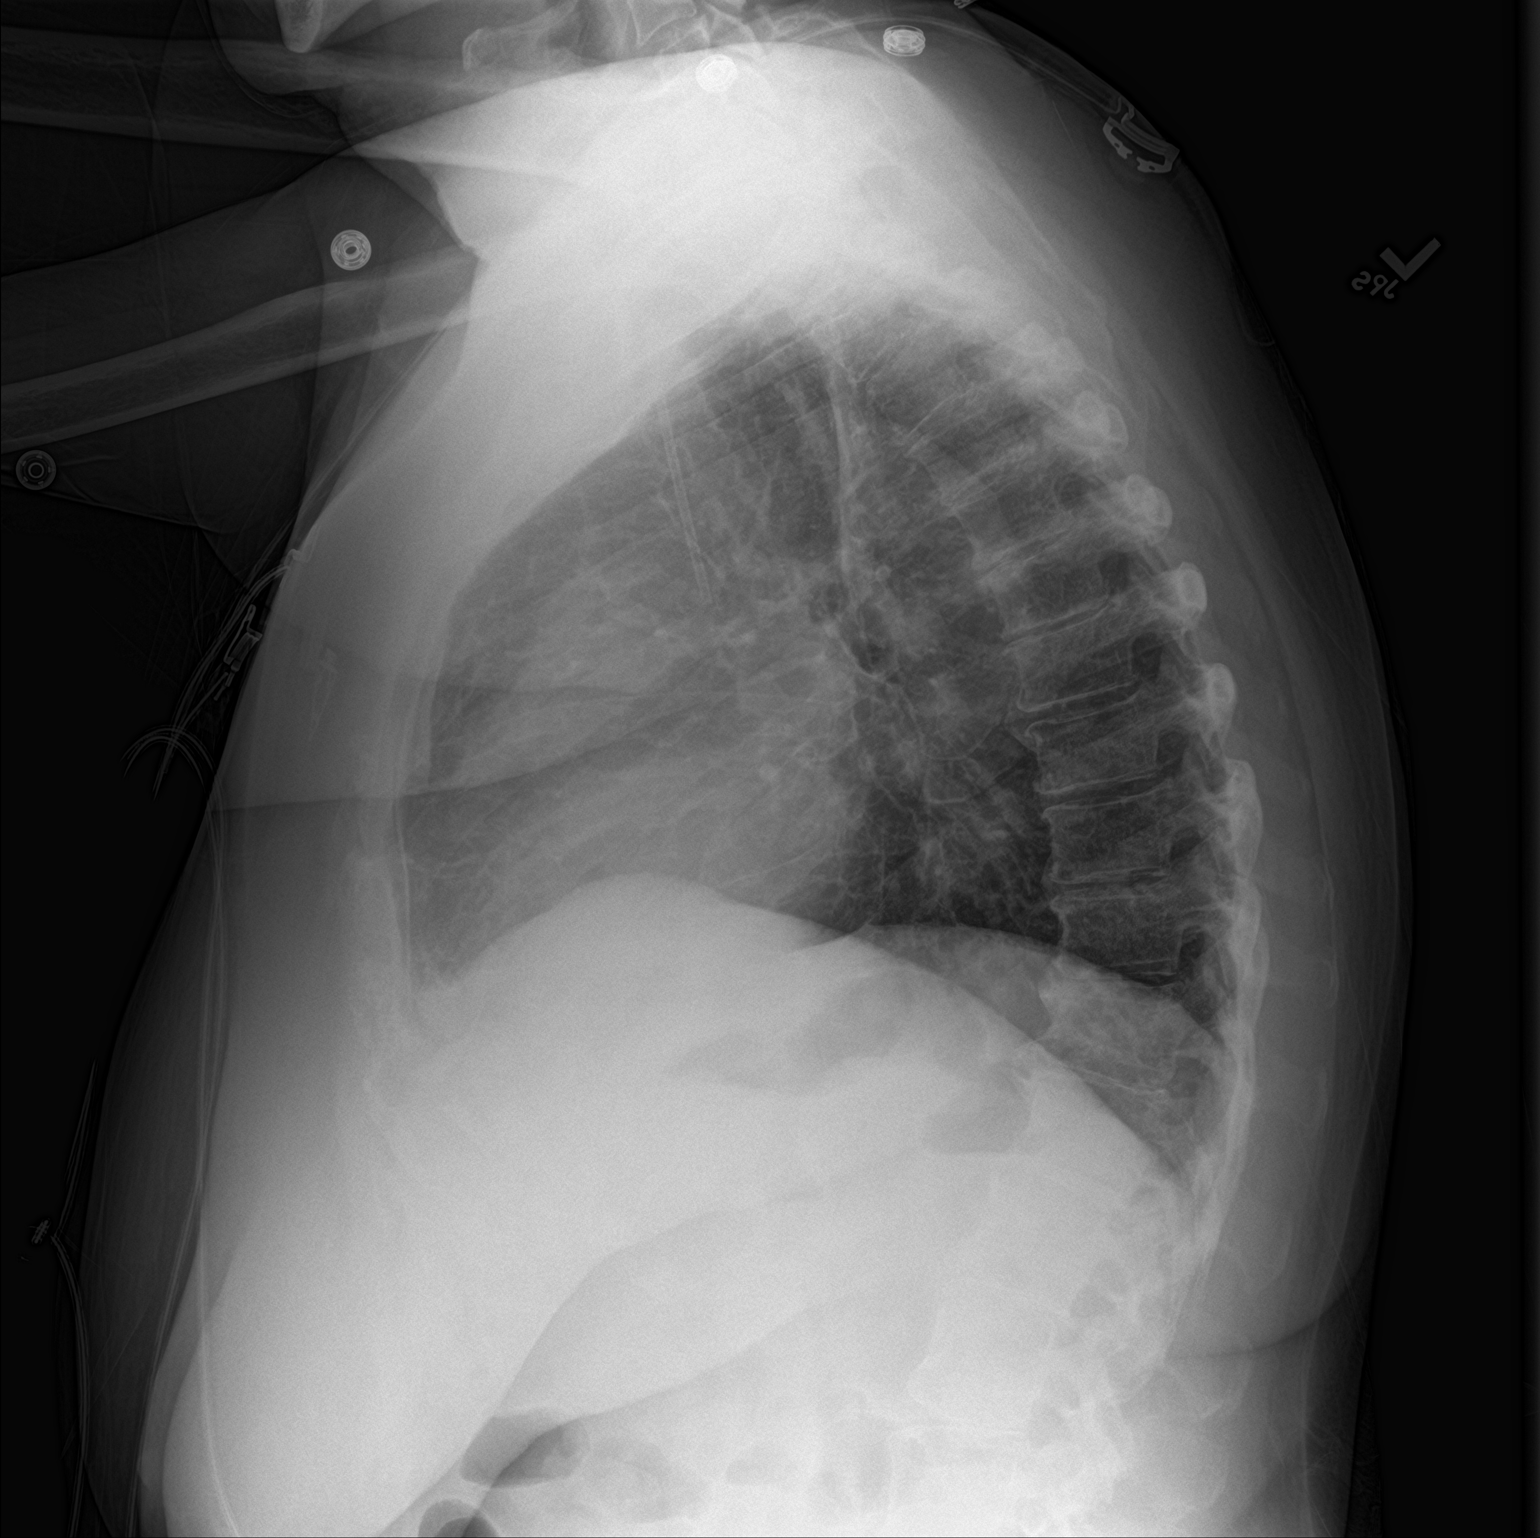

[2 of 2 positions shown; findings below may reference images not displayed]

FINDINGS: The heart size and mediastinal contours are within normal limits.
Right internal jugular Port-A-Cath is noted with tip in expected
position of the SVC. Both lungs are clear. The visualized skeletal
structures are unremarkable.
IMPRESSION: No active cardiopulmonary disease.

## 2020-11-05 IMAGING — DX DG CHEST 1V PORT
1 series · 1 of 1 positions shown · non-contrast
Comparison: 05/14/2019

CLINICAL DATA: Fever, chills

EXAM:
PORTABLE CHEST 1 VIEW

[chest ap]
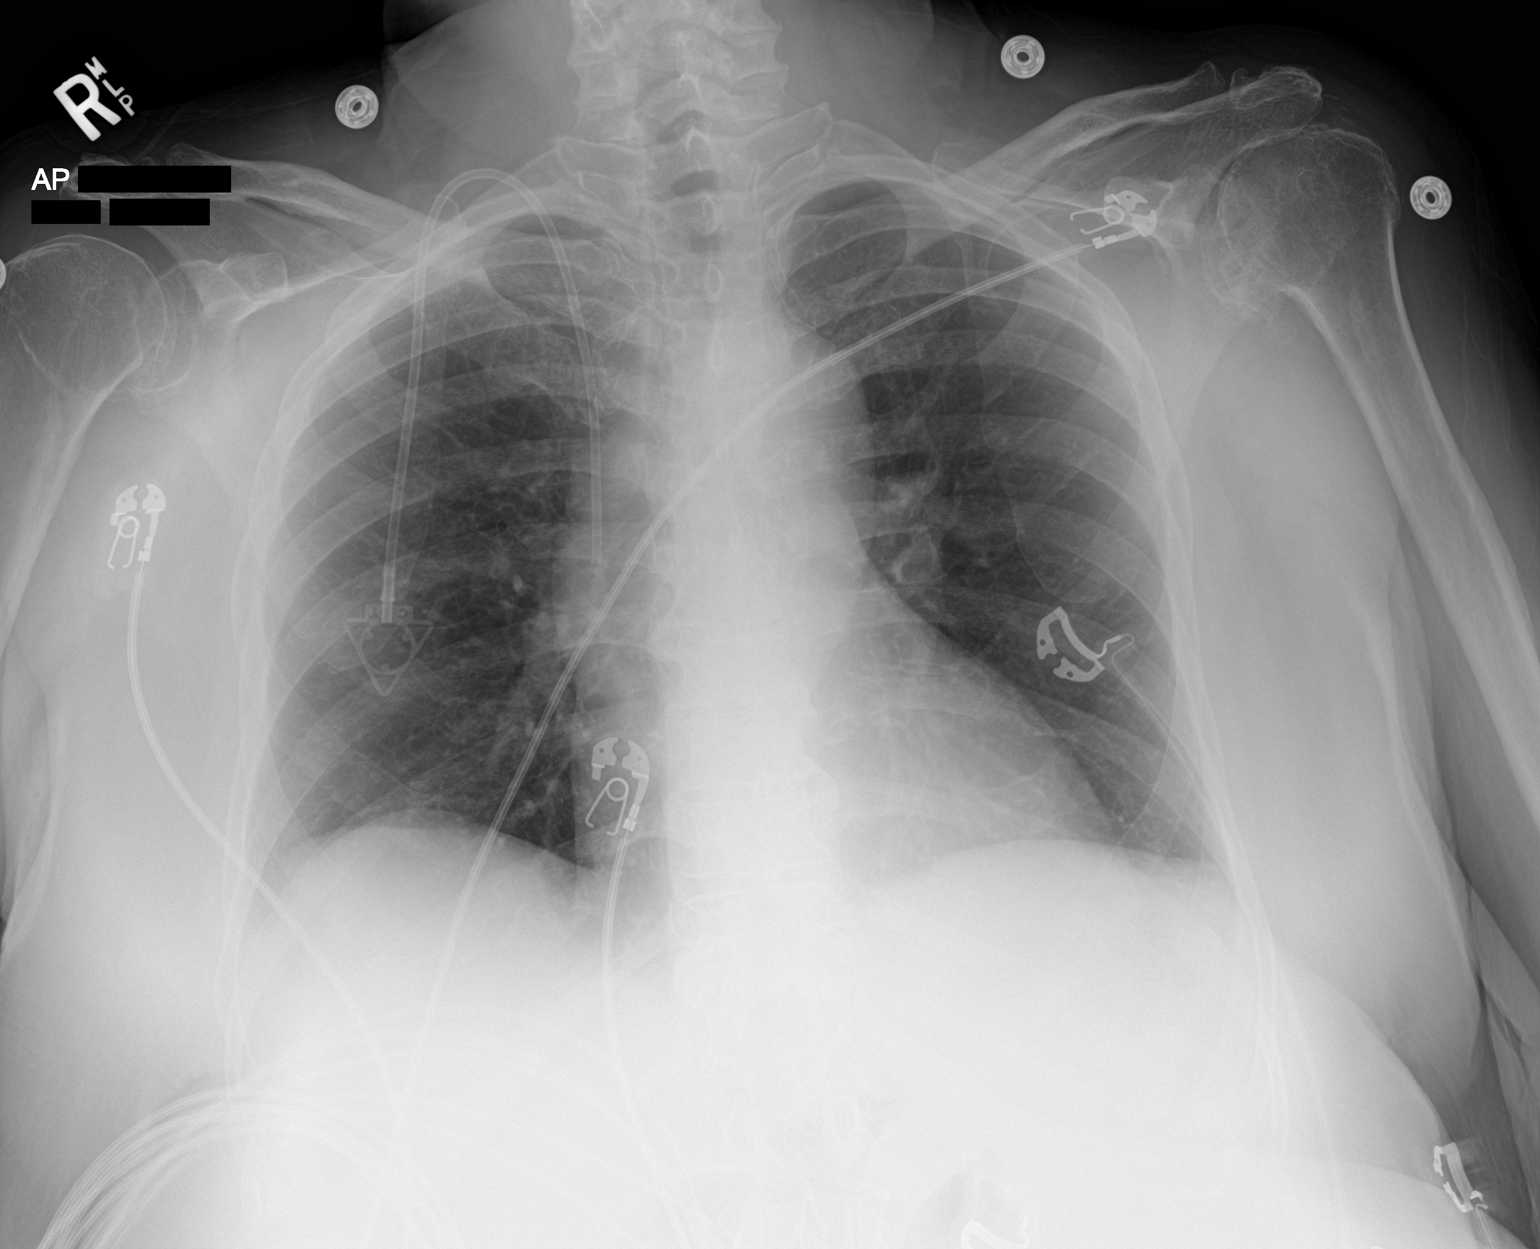

[1 of 1 positions shown; findings below may reference images not displayed]

FINDINGS: Right Port-A-Cath in place, unchanged. Heart and mediastinal
contours are within normal limits. No focal opacities or effusions.
No acute bony abnormality.
IMPRESSION: No active disease.

## 2020-11-07 IMAGING — DX DG CHEST 1V PORT
1 series · 1 of 1 positions shown · non-contrast
Comparison: Chest radiograph 08/24/2018

CLINICAL DATA: hypoxia

EXAM:
PORTABLE CHEST 1 VIEW

[chest]
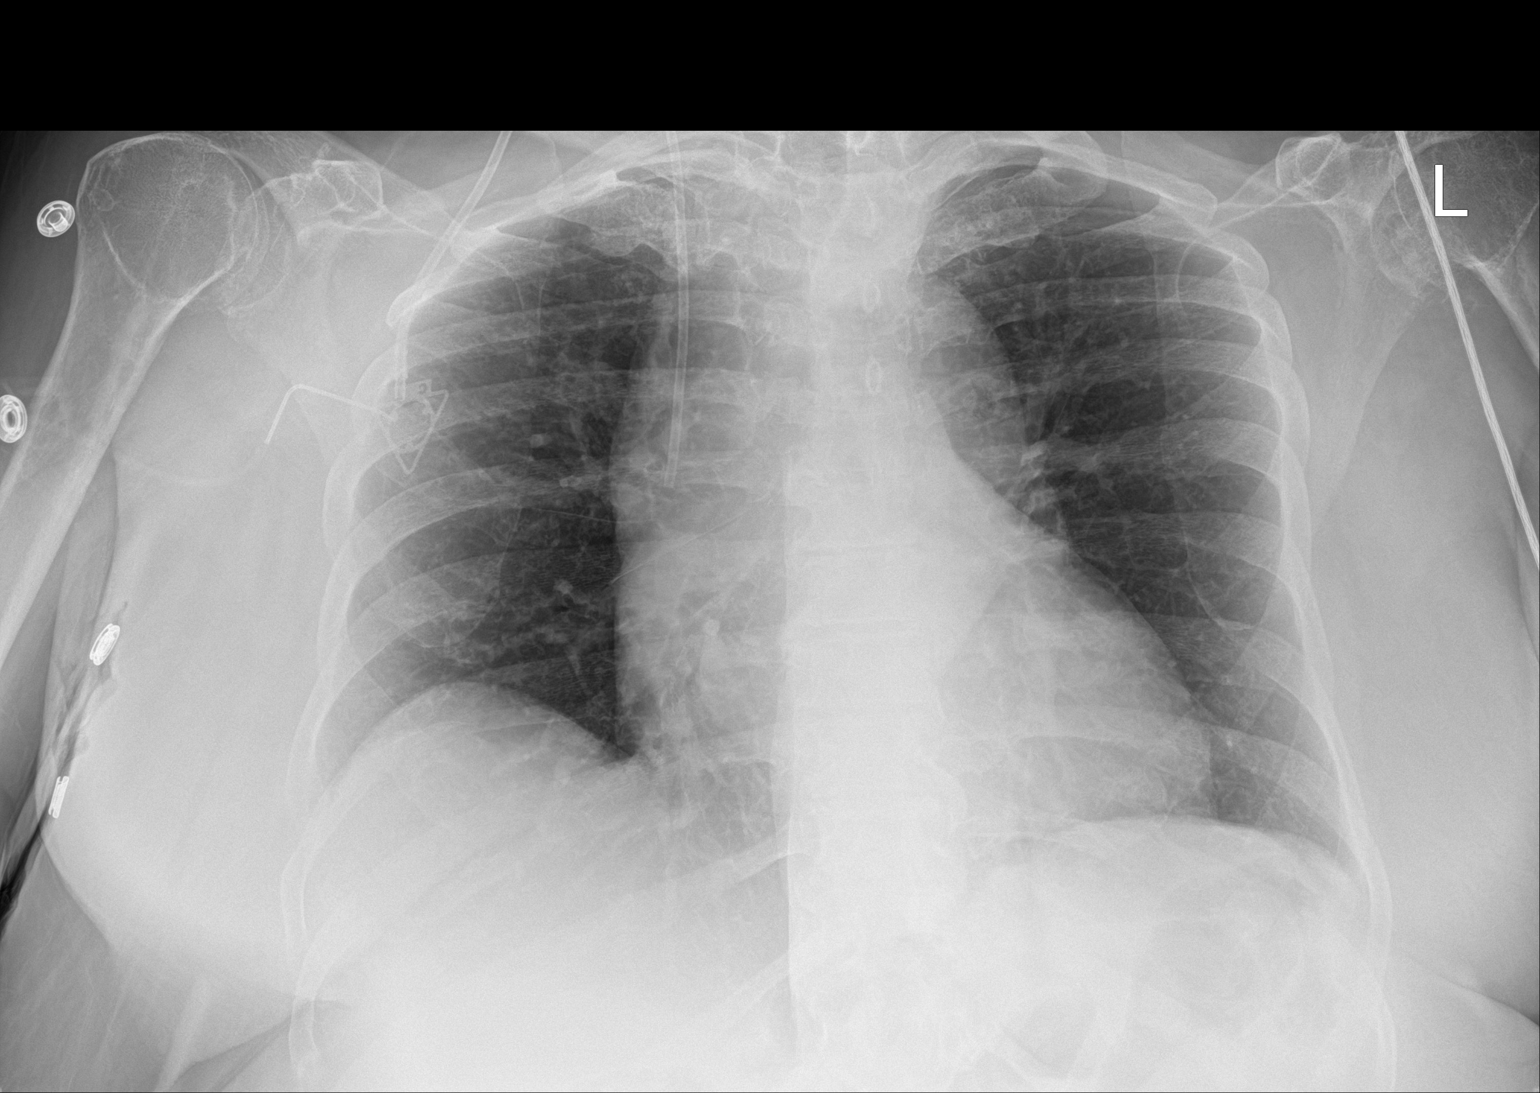

[1 of 1 positions shown; findings below may reference images not displayed]

FINDINGS: Enlargement of the cardiomediastinal contours likely secondary to AP
technique. Right central venous catheter is unchanged. The lungs are
clear. No pneumothorax or large pleural effusion. No acute finding
in the visualized skeleton.
IMPRESSION: No evidence of active disease.

## 2020-11-09 IMAGING — DX DG CHEST 1V PORT
1 series · 1 of 1 positions shown · non-contrast
Comparison: 07/27/2019

CLINICAL DATA: Hypoxia

EXAM:
PORTABLE CHEST 1 VIEW

[chest]
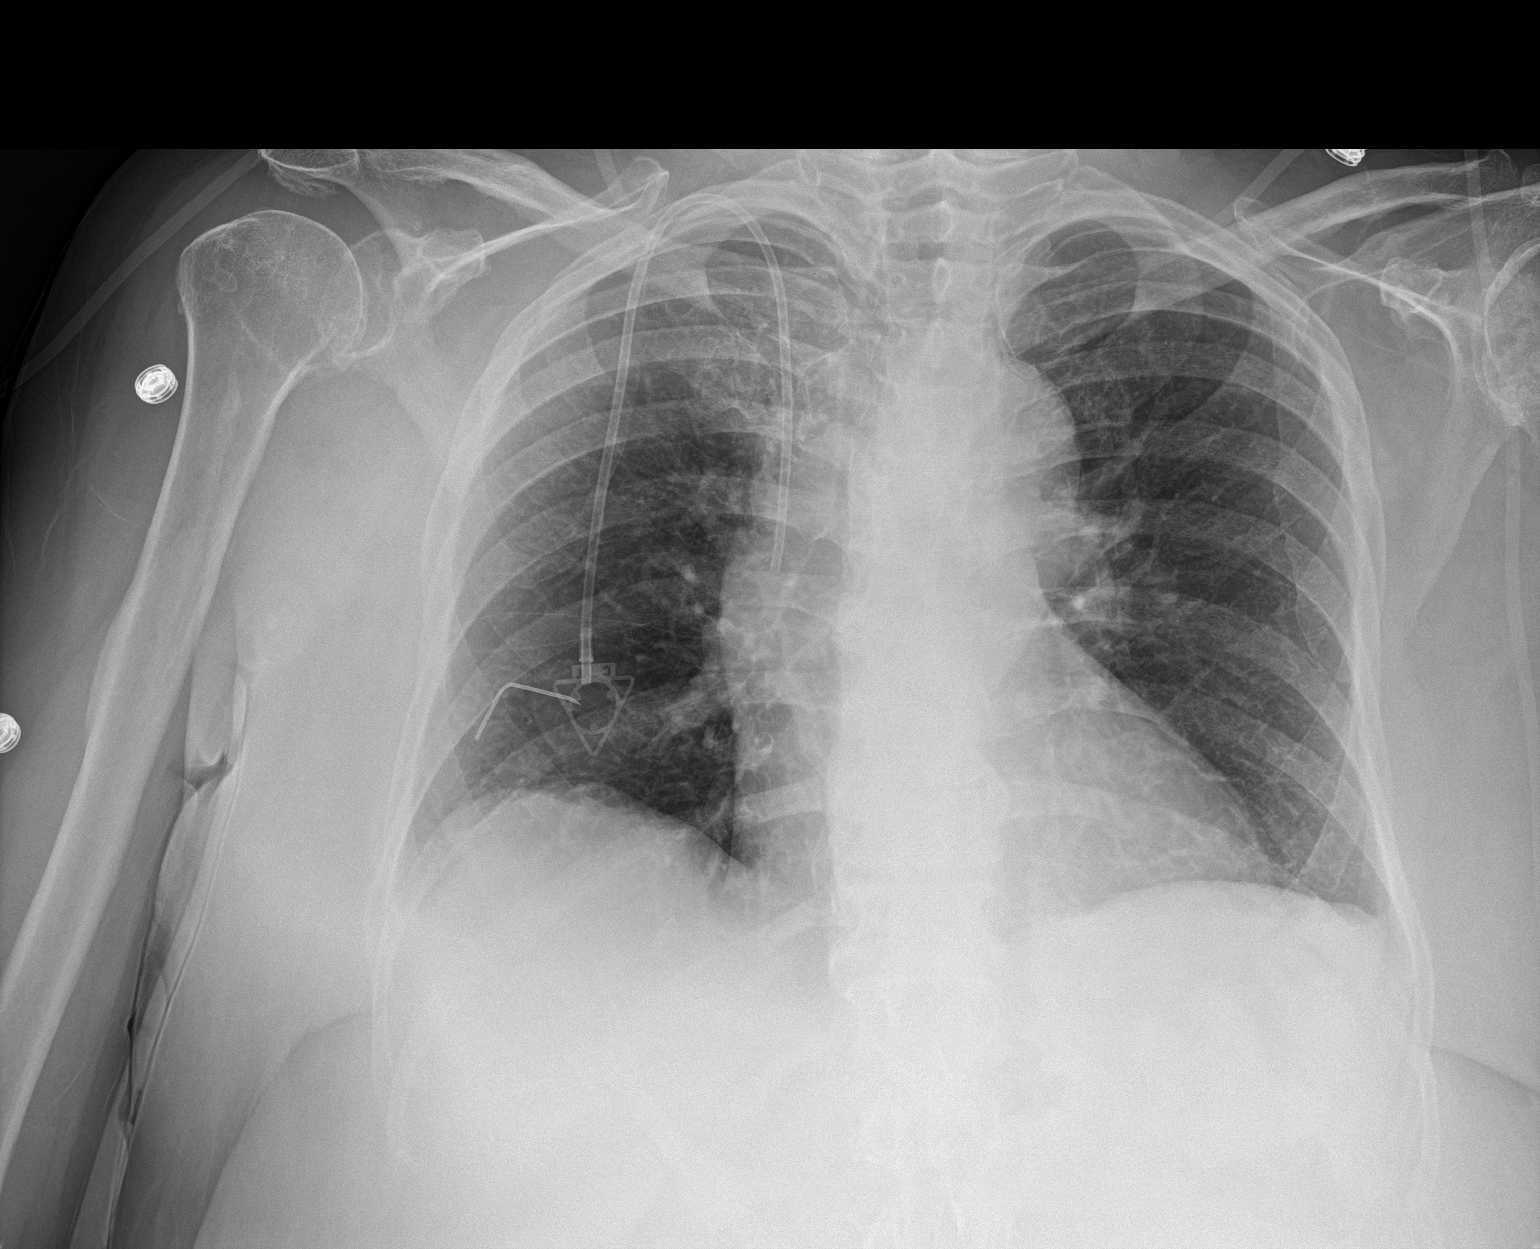

[1 of 1 positions shown; findings below may reference images not displayed]

FINDINGS: The heart size and mediastinal contours are within normal limits.
Right chest port catheter. Both lungs are clear. The visualized
skeletal structures are unremarkable.
IMPRESSION: No acute abnormality of the lungs in AP portable projection.

## 2020-11-19 IMAGING — DX DG CHEST 1V PORT
1 series · 1 of 1 positions shown · non-contrast
Comparison: July 29, 2019

CLINICAL DATA: Shortness of breath

EXAM:
PORTABLE CHEST 1 VIEW

[chest ap]
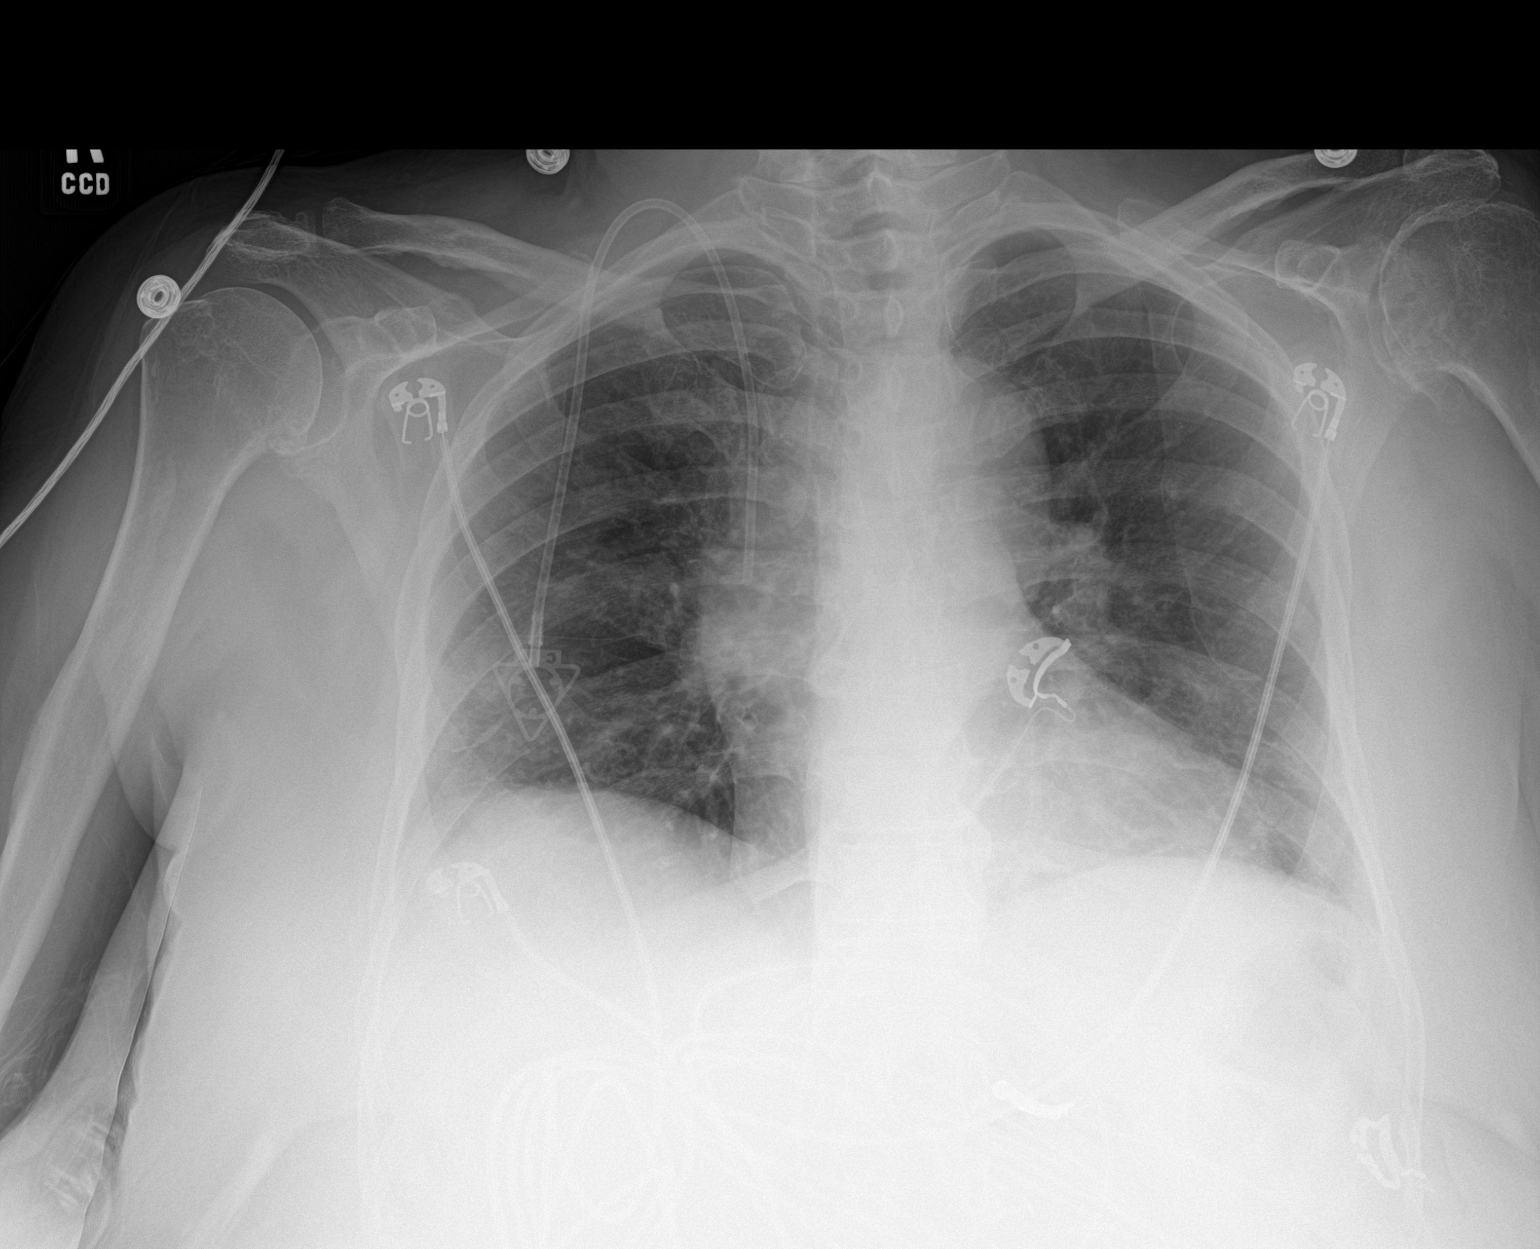

[1 of 1 positions shown; findings below may reference images not displayed]

FINDINGS: Port-A-Cath tip is in the superior vena cava. No pneumothorax. There
is a small right pleural effusion. There is no edema or
consolidation. Heart is upper normal in size with pulmonary
vascularity normal. No evident adenopathy. There is degenerative
change in the left shoulder.
IMPRESSION: Central catheter position unchanged without pneumothorax. Small
right pleural effusion. No edema or consolidation. Stable cardiac
silhouette. No demonstrable adenopathy.

## 2020-11-21 IMAGING — DX DG FEMUR 1V*R*
2 series · 2 of 2 positions shown · non-contrast
Comparison: None.

CLINICAL DATA: Pain status post fall.

EXAM:
RIGHT FEMUR 1 VIEW

[femur ap (1 of 2)]
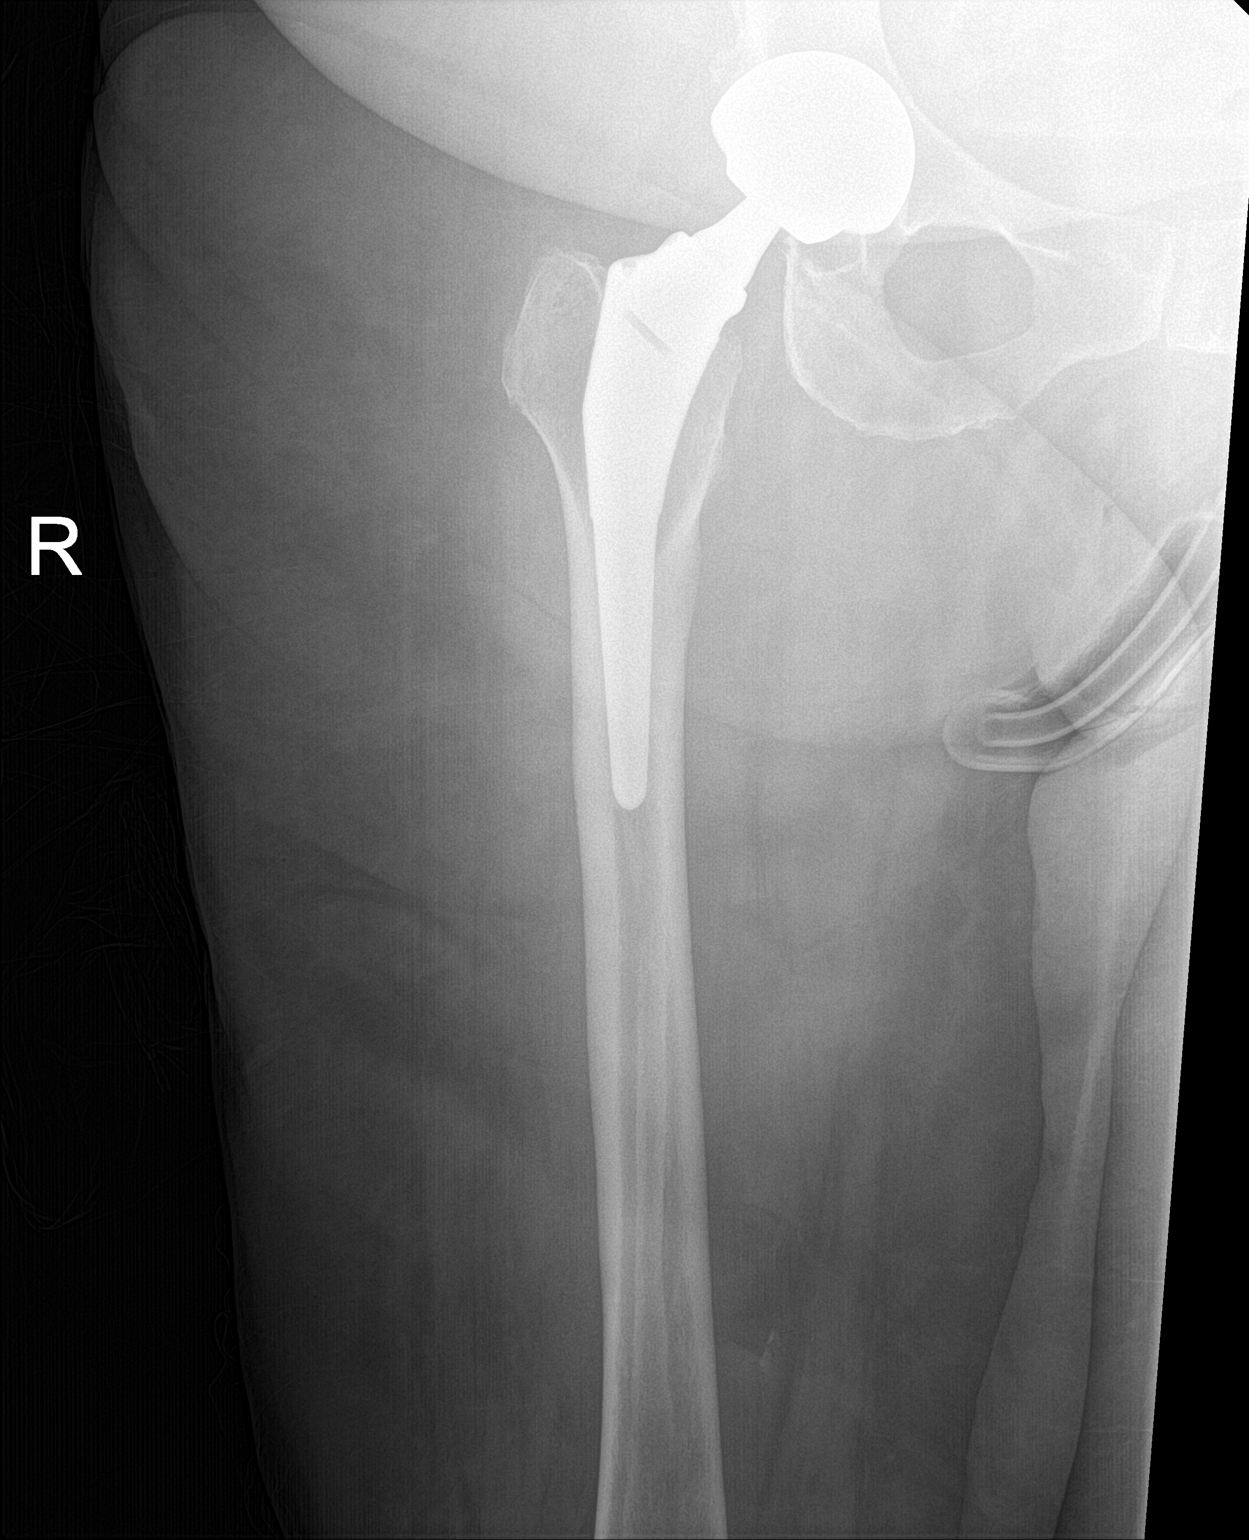

[femur ap (2 of 2)]
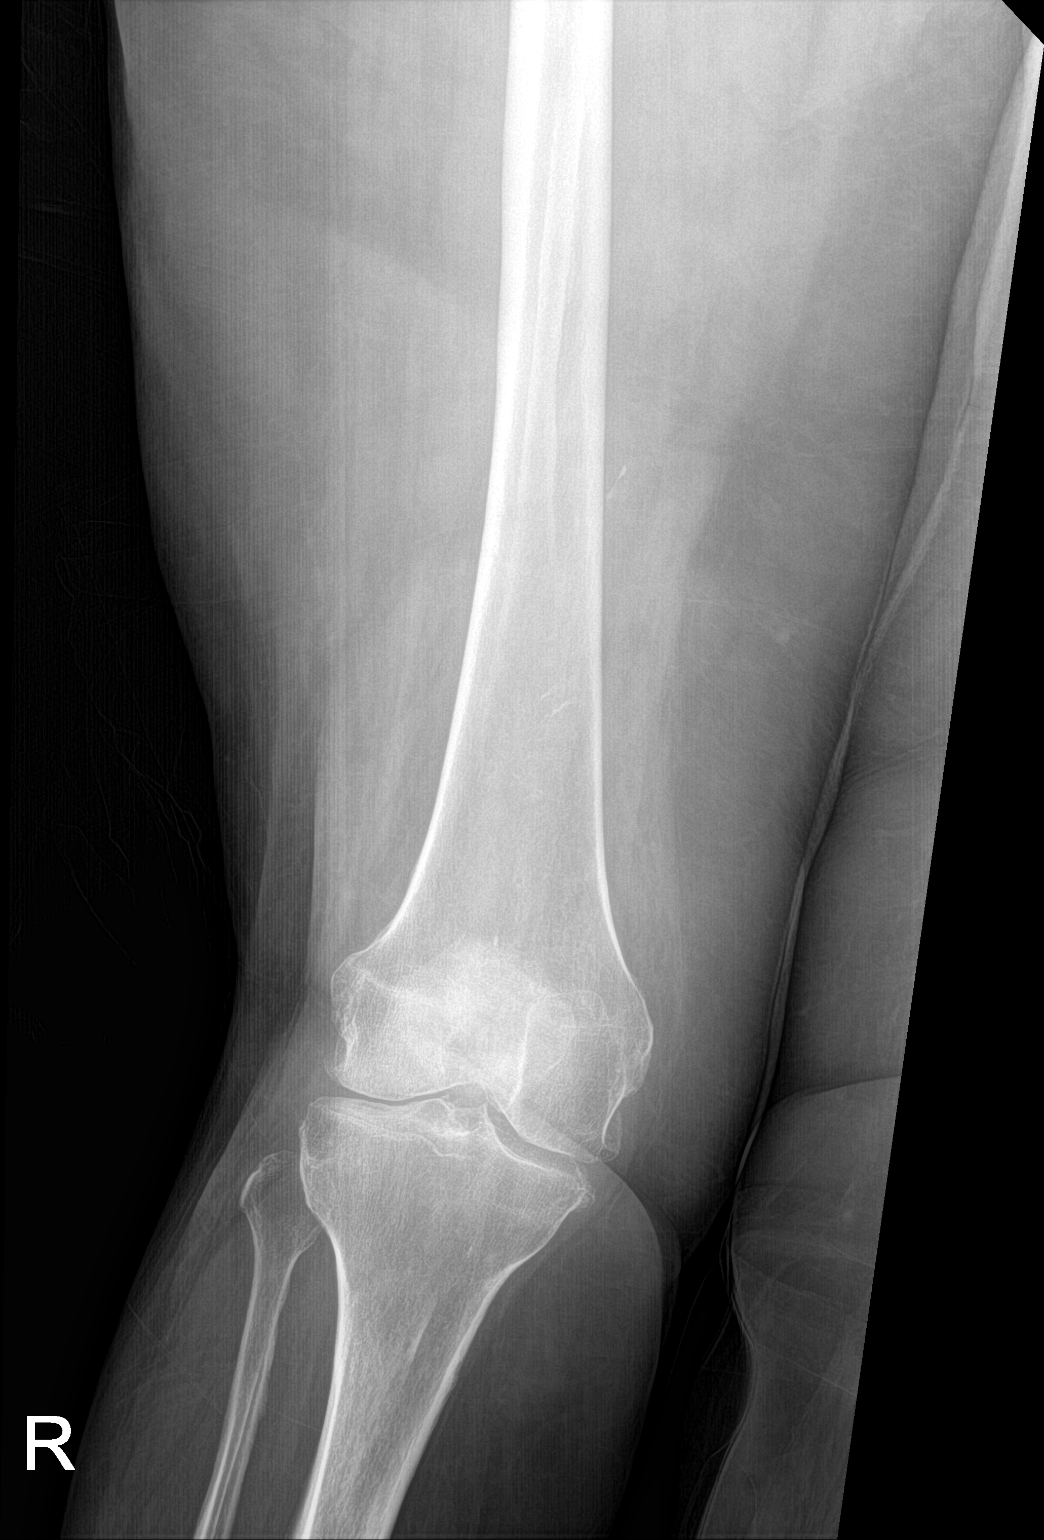

[2 of 2 positions shown; findings below may reference images not displayed]

FINDINGS: The patient is status post total hip arthroplasty on the right.
Vascular calcifications are noted. There is no displaced fracture.
No dislocation. There are few subtle lucencies coursing through the
greater trochanter. There are multicompartmental degenerative
changes of the right knee. Osteopenia is noted.
IMPRESSION: 1. Subtle lucencies are noted coursing through the greater
trochanter. If there is clinical concern for a fracture at this
level, consider dedicated right hip radiographs for further
evaluation.
2. Status post total hip arthroplasty.
3. Multicompartmental degenerative changes are noted of the knee.

## 2020-11-21 IMAGING — CT CT HEAD W/O CM
3 series · 16 of 47 positions shown, 19 images · non-contrast
Comparison: 03/19/2018

CLINICAL DATA: Headache.  Suspicion of intracranial hemorrhage.

EXAM:
CT HEAD WITHOUT CONTRAST
TECHNIQUE: Contiguous axial images were obtained from the base of the skull
through the vertex without intravenous contrast.

[Series 2: head wo · axial · 0.44mm/px · z∈[-153,-23]mm · 10 of 32 slices shown, 13 images]
[im 3/32  brain]
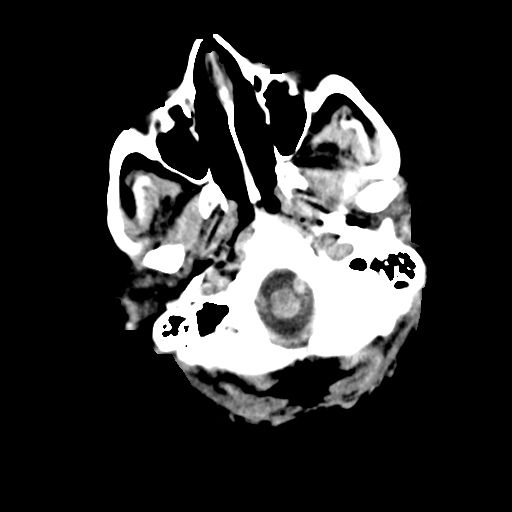
[im 3/32  bone]
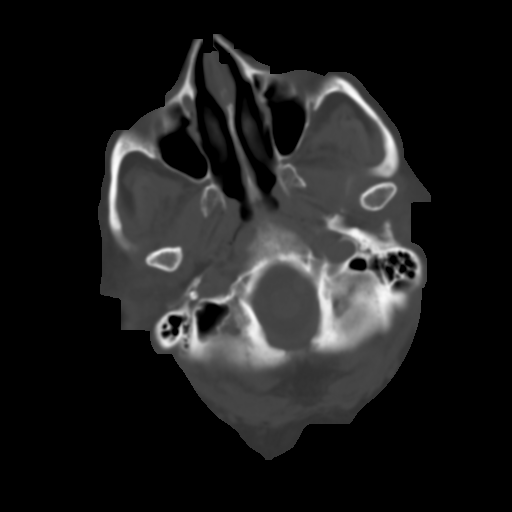
[im 6/32  brain]
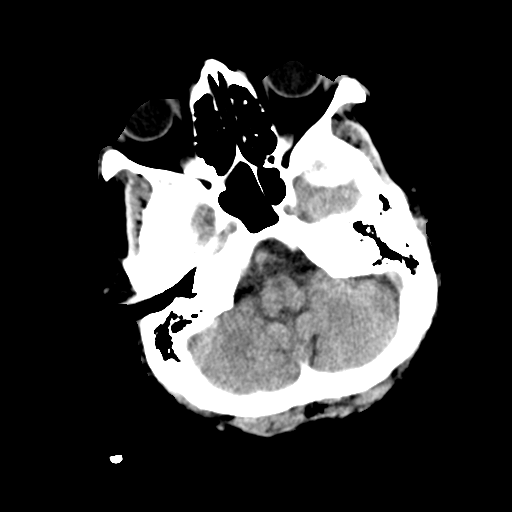
[im 9/32  brain]
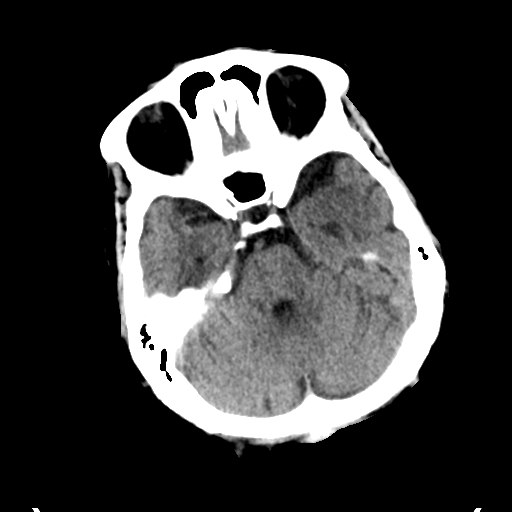
[im 11/32  brain]
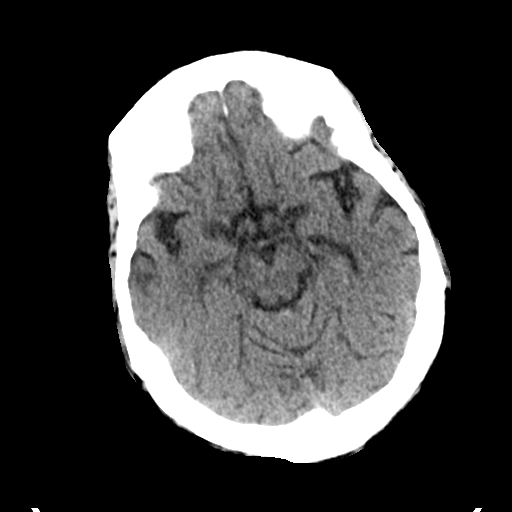
[im 14/32  brain]
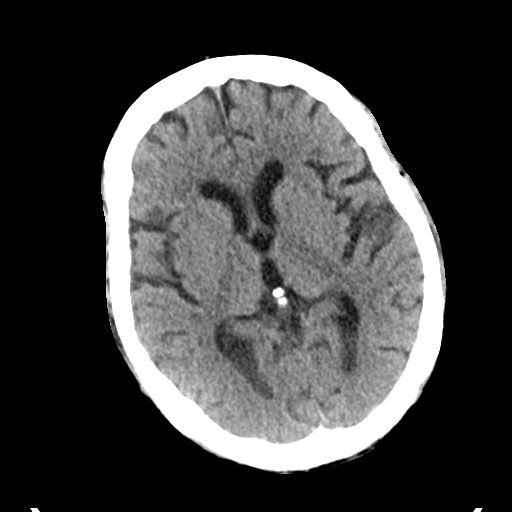
[im 14/32  bone]
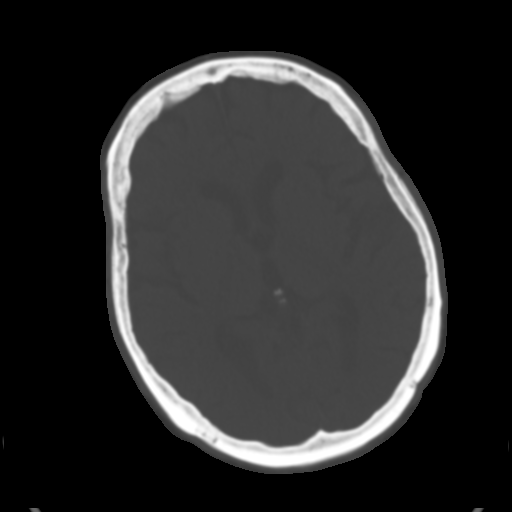
[im 18/32  brain]
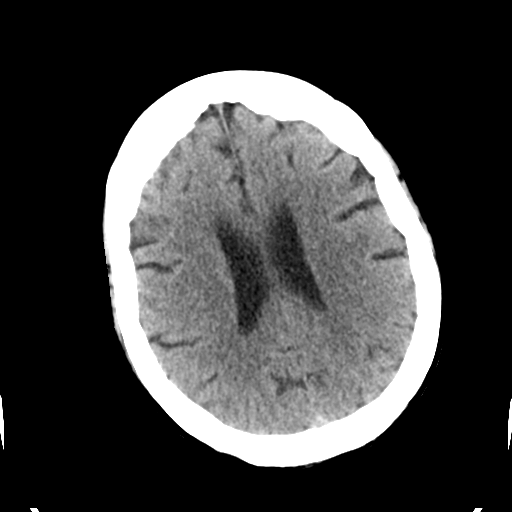
[im 21/32  brain]
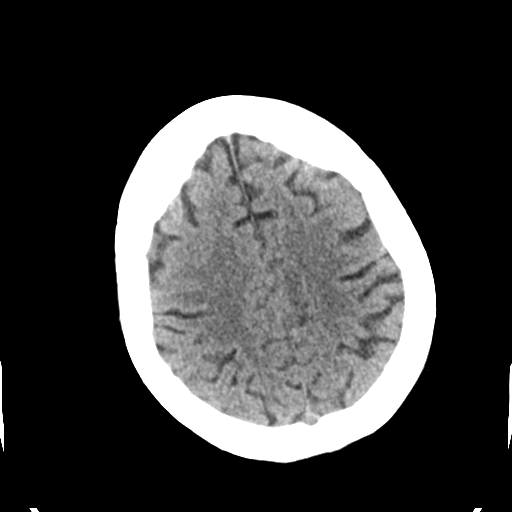
[im 24/32  brain]
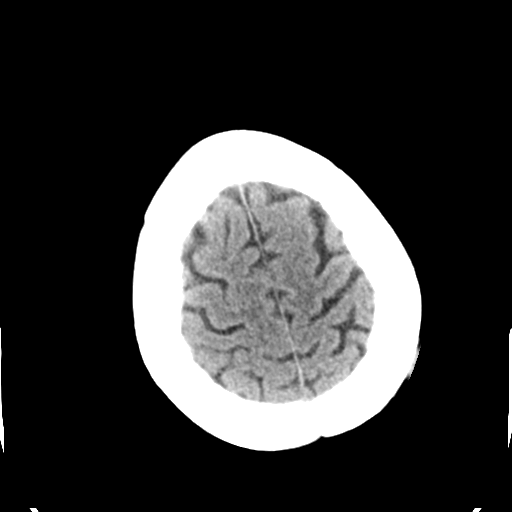
[im 26/32  brain]
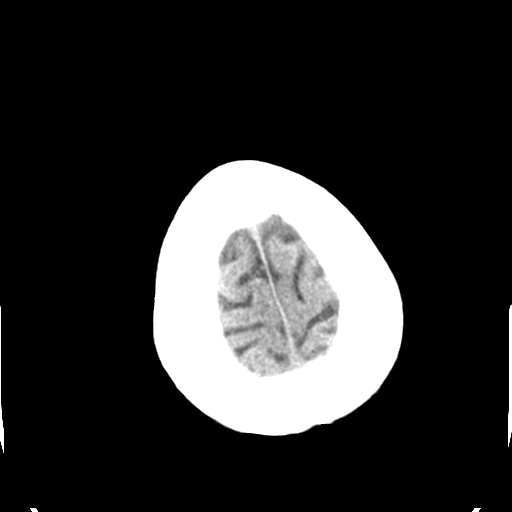
[im 26/32  bone]
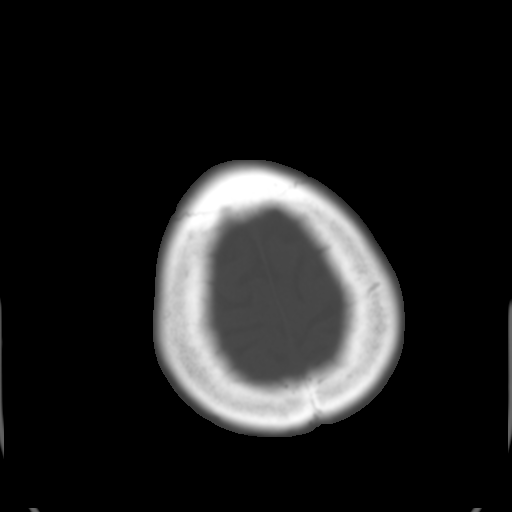
[im 29/32  brain]
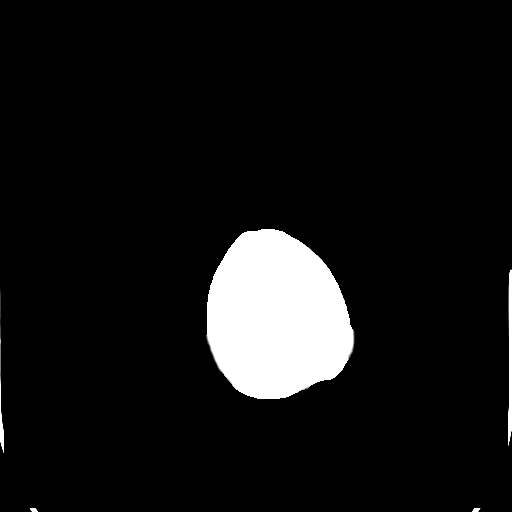

[Series 4: coronal soft tissue · coronal · 0.32mm/px · 3 of 70 slices shown]
[im 24/70  brain]
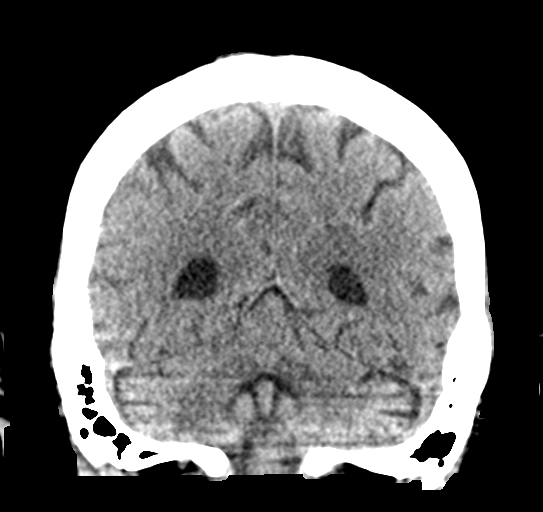
[im 31/70  brain]
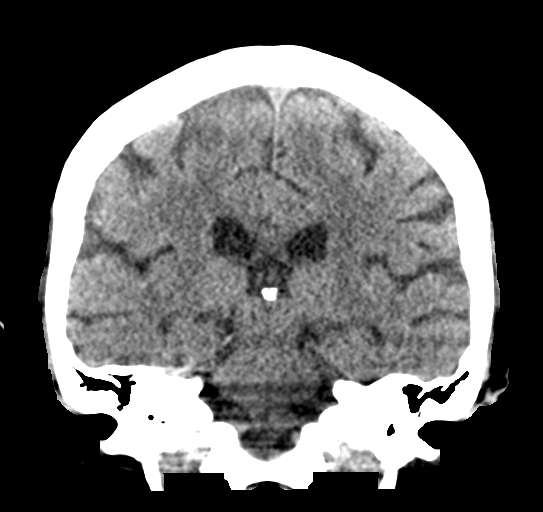
[im 39/70  brain]
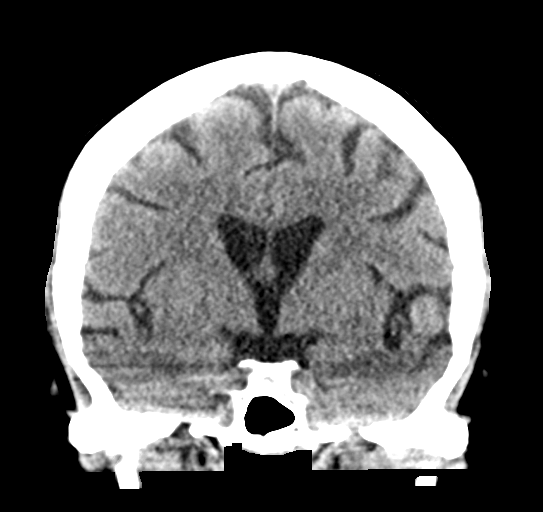

[Series 5: sagittal soft tissue · sagittal · 0.34mm/px · 3 of 56 slices shown]
[im 19/56  brain]
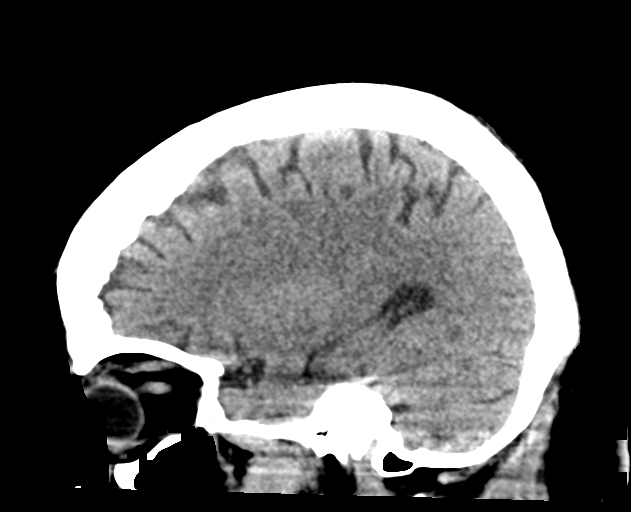
[im 28/56  brain]
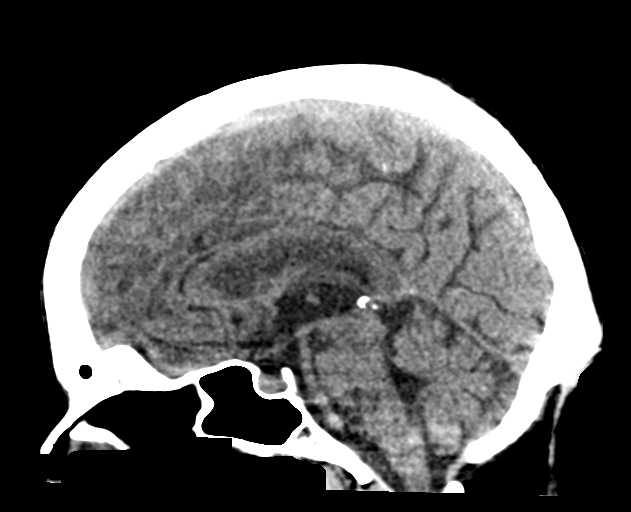
[im 37/56  brain]
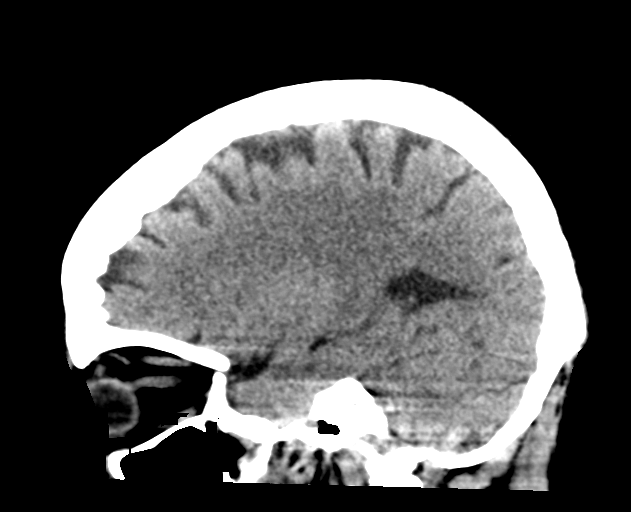

[16 of 47 positions shown; findings below may reference images not displayed]

FINDINGS: Brain: Mild age related atrophy. No evidence of old or acute
infarction, mass lesion, hemorrhage, hydrocephalus or extra-axial
collection.

Vascular: There is atherosclerotic calcification of the major
vessels at the base of the brain.

Skull: Negative

Sinuses/Orbits: Clear/normal

Other: None
IMPRESSION: Normal study for age.  No cause of headache identified.

## 2020-11-21 IMAGING — DX DG ABDOMEN 2V
3 series · 3 of 3 positions shown · non-contrast
Comparison: CT abdomen pelvis 08/08/2019

CLINICAL DATA: [id number] positive, acute onset lower abdominal pain.

EXAM:
ABDOMEN - 2 VIEW

[abdomen erect]
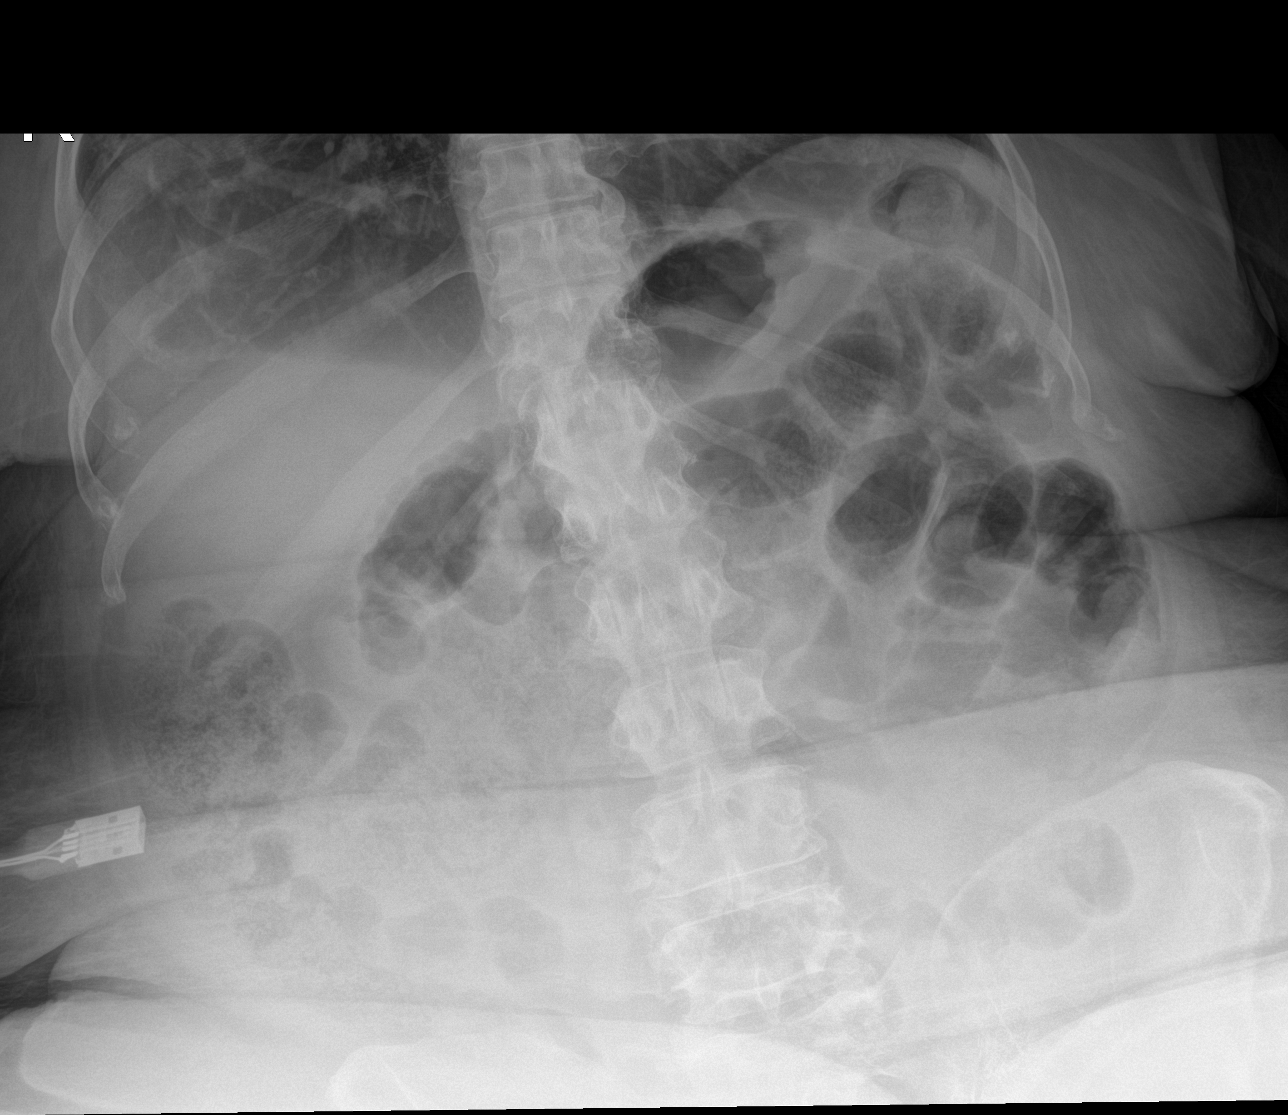

[abdomen supine (1 of 2)]
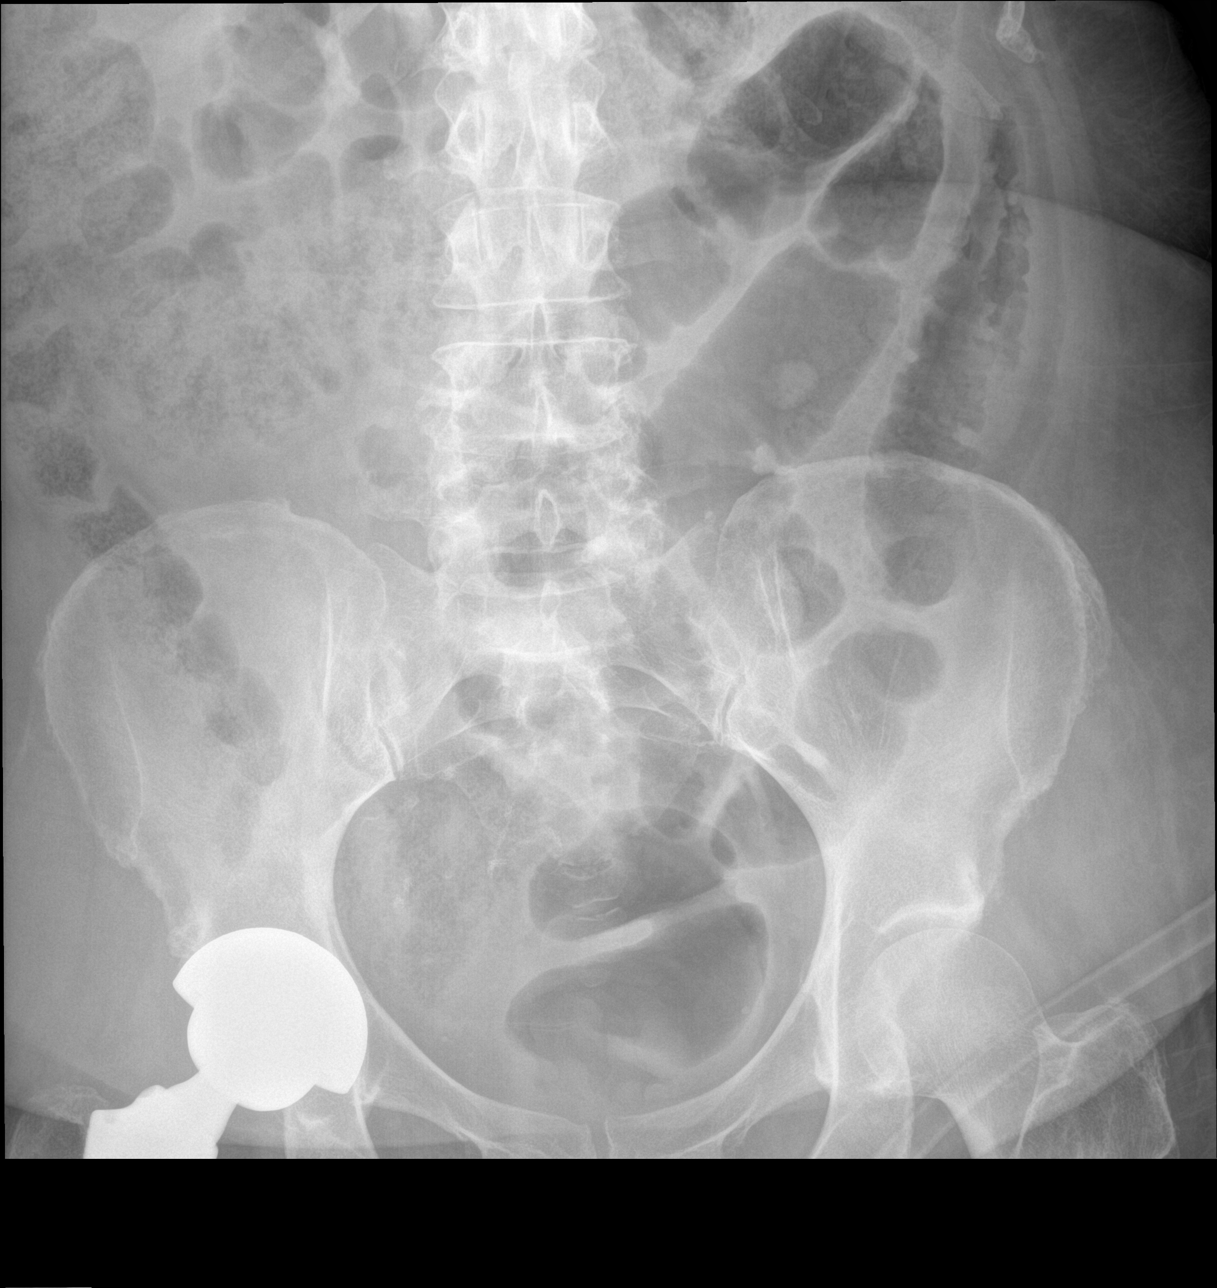

[abdomen supine (2 of 2)]
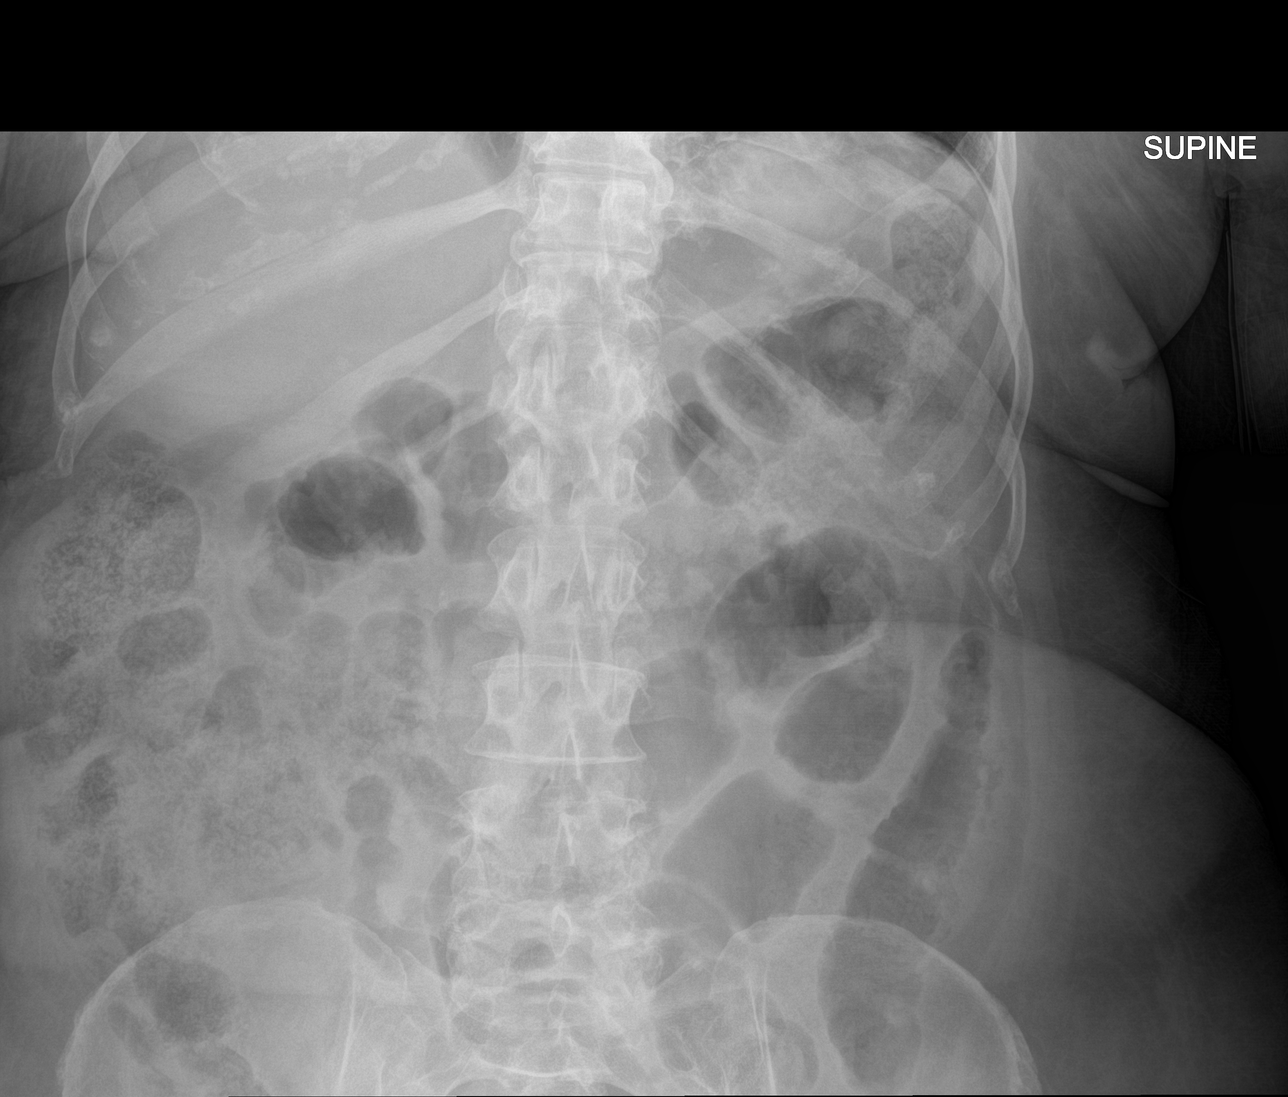

[3 of 3 positions shown; findings below may reference images not displayed]

FINDINGS: Few contiguous loops of borderline air distended small bowel in the
mid abdomen. Large volume of stool throughout the colon. Air and
stool overlying the rectal vault. No suspicious calcifications.
Prior right hip arthroplasty is noted. Degenerative changes noted in
the lumbar spine, pelvis and left hip.
IMPRESSION: Few contiguous loops of increasingly air distended small bowel in
the mid abdomen could reflect ileus or early obstruction.

## 2020-11-21 IMAGING — DX DG FEMUR 1V*L*
2 series · 2 of 2 positions shown · non-contrast
Comparison: None.

CLINICAL DATA: Pain status post fall

EXAM:
LEFT FEMUR 1 VIEW

[femur ap (1 of 2)]
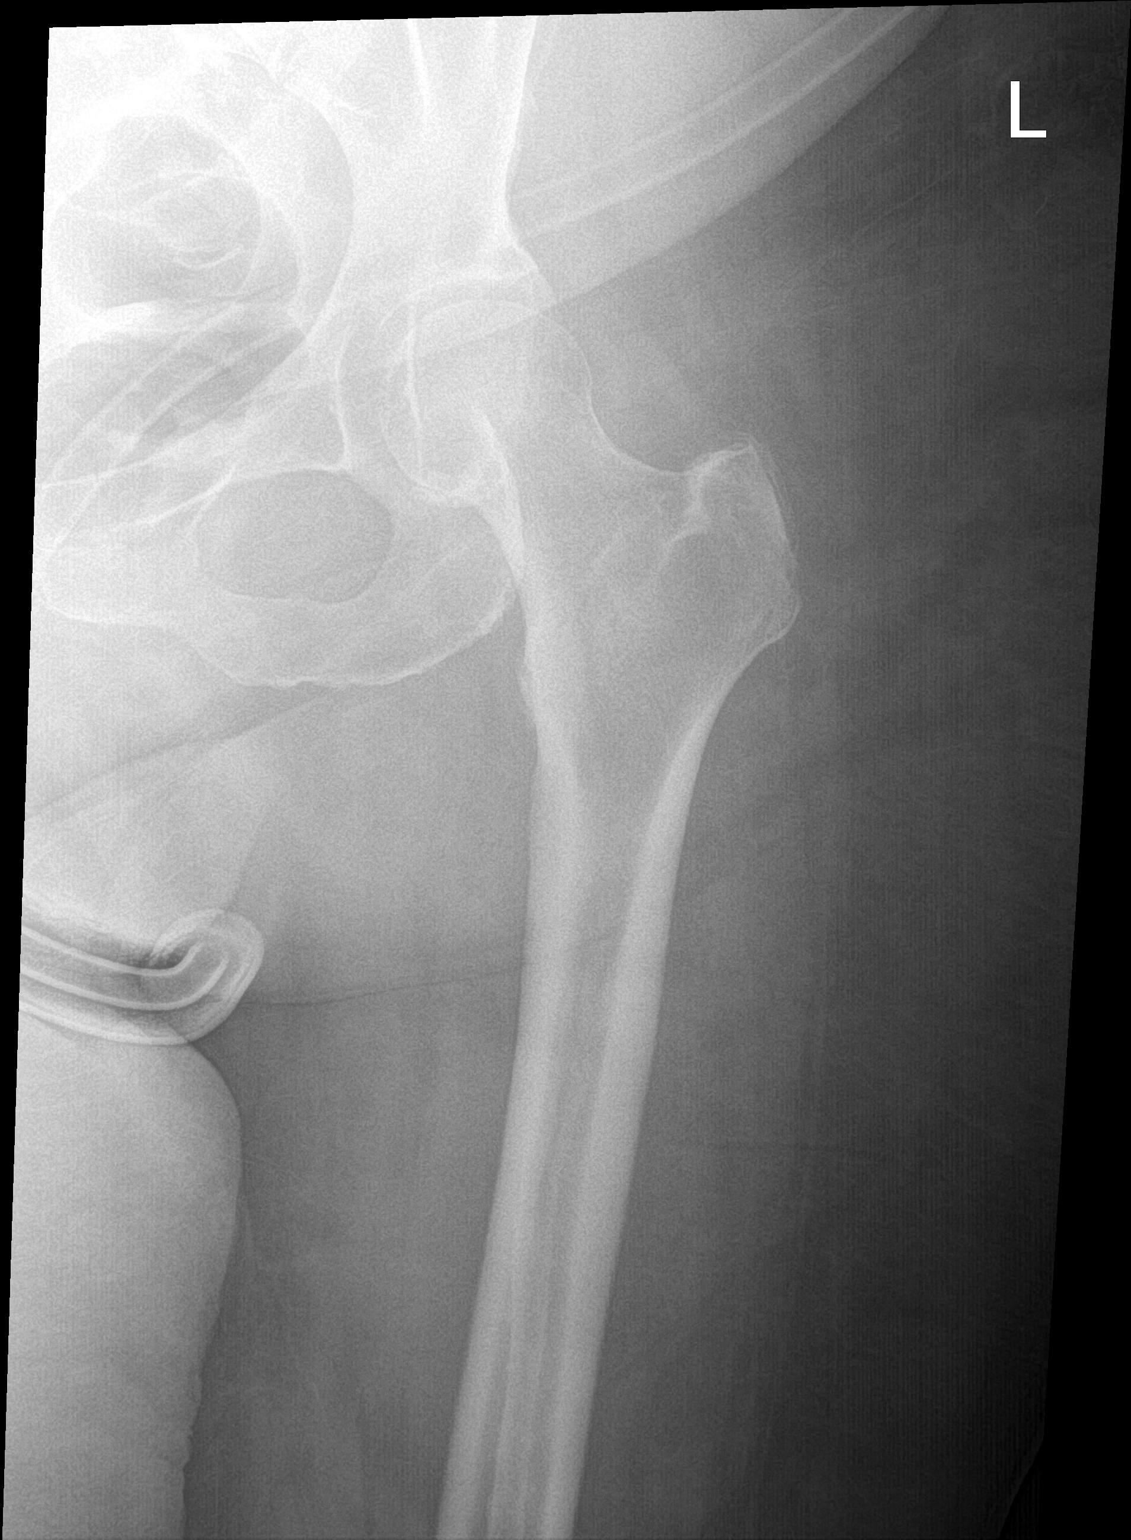

[femur ap (2 of 2)]
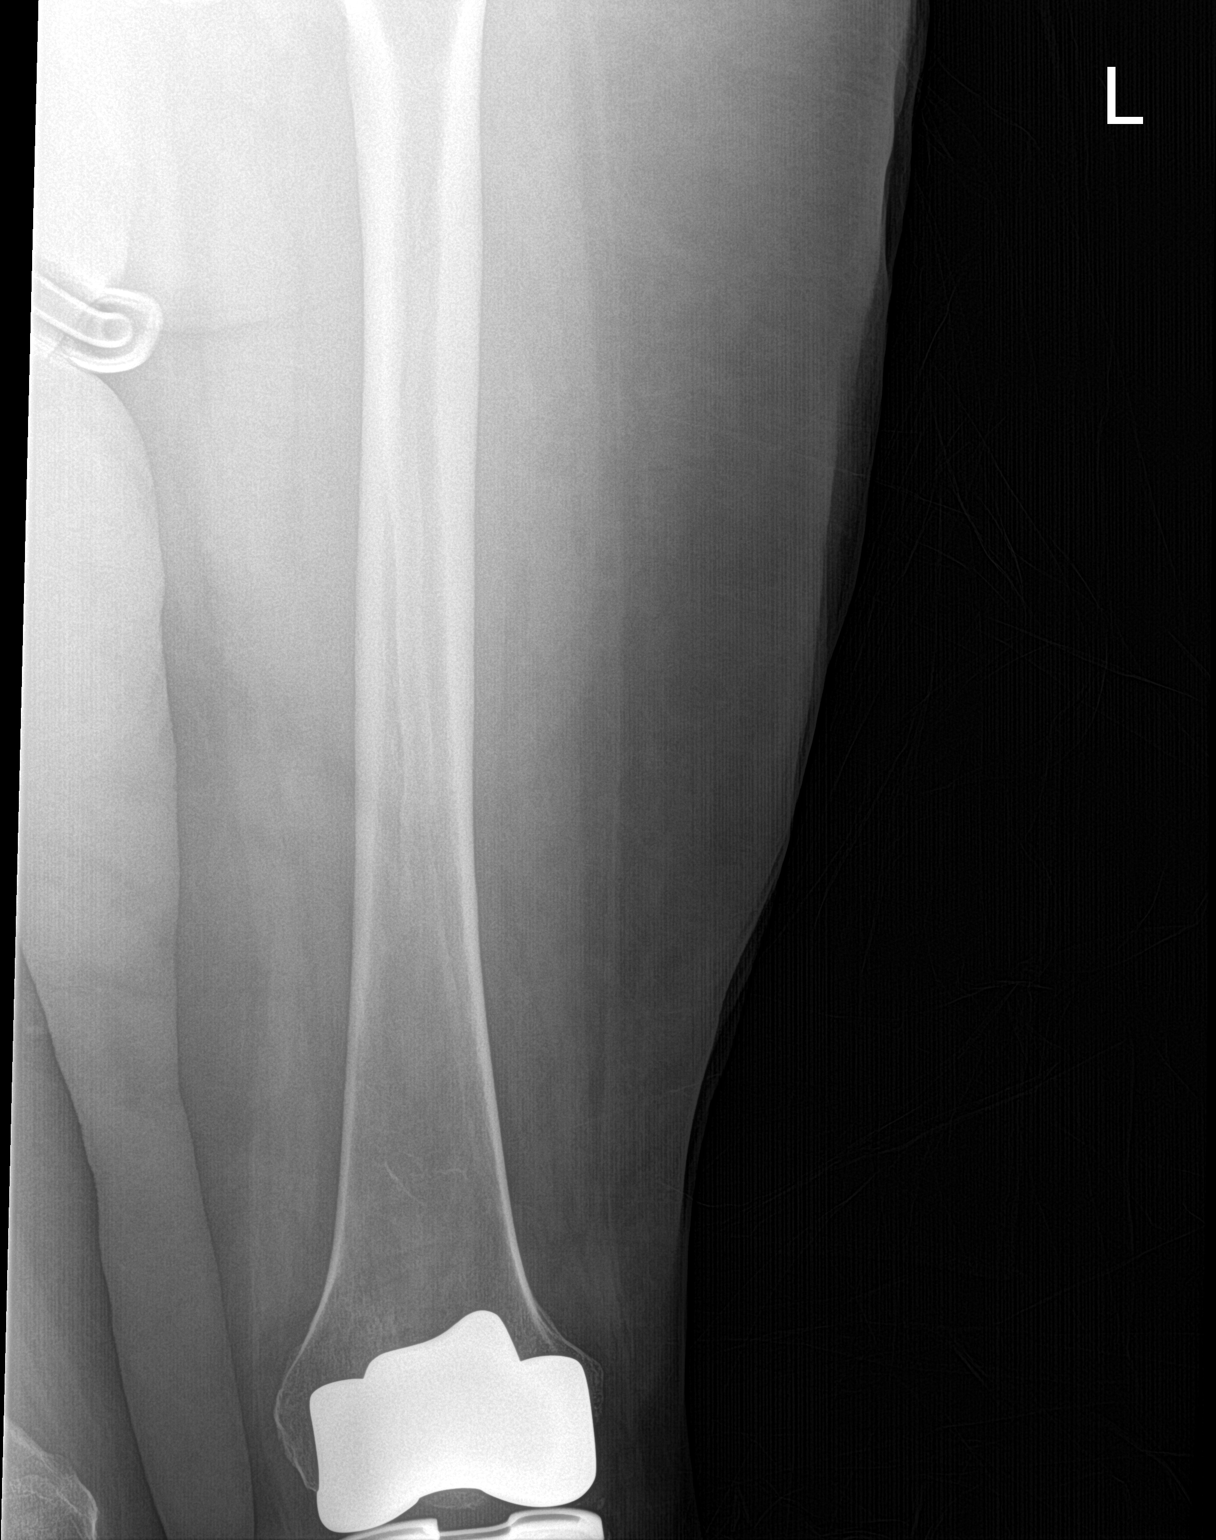

[2 of 2 positions shown; findings below may reference images not displayed]

FINDINGS: The patient is status post total knee arthroplasty on the left.
There is no acute displaced fracture. No dislocation. Evaluation is
limited by single view technique. Vascular calcifications are noted.
IMPRESSION: No definite acute displaced fracture.

## 2020-11-27 IMAGING — DX DG CHEST 1V PORT
1 series · 1 of 1 positions shown · non-contrast
Comparison: August 08, 2019

CLINICAL DATA: Hypoxia.

EXAM:
PORTABLE CHEST 1 VIEW

[chest ap]
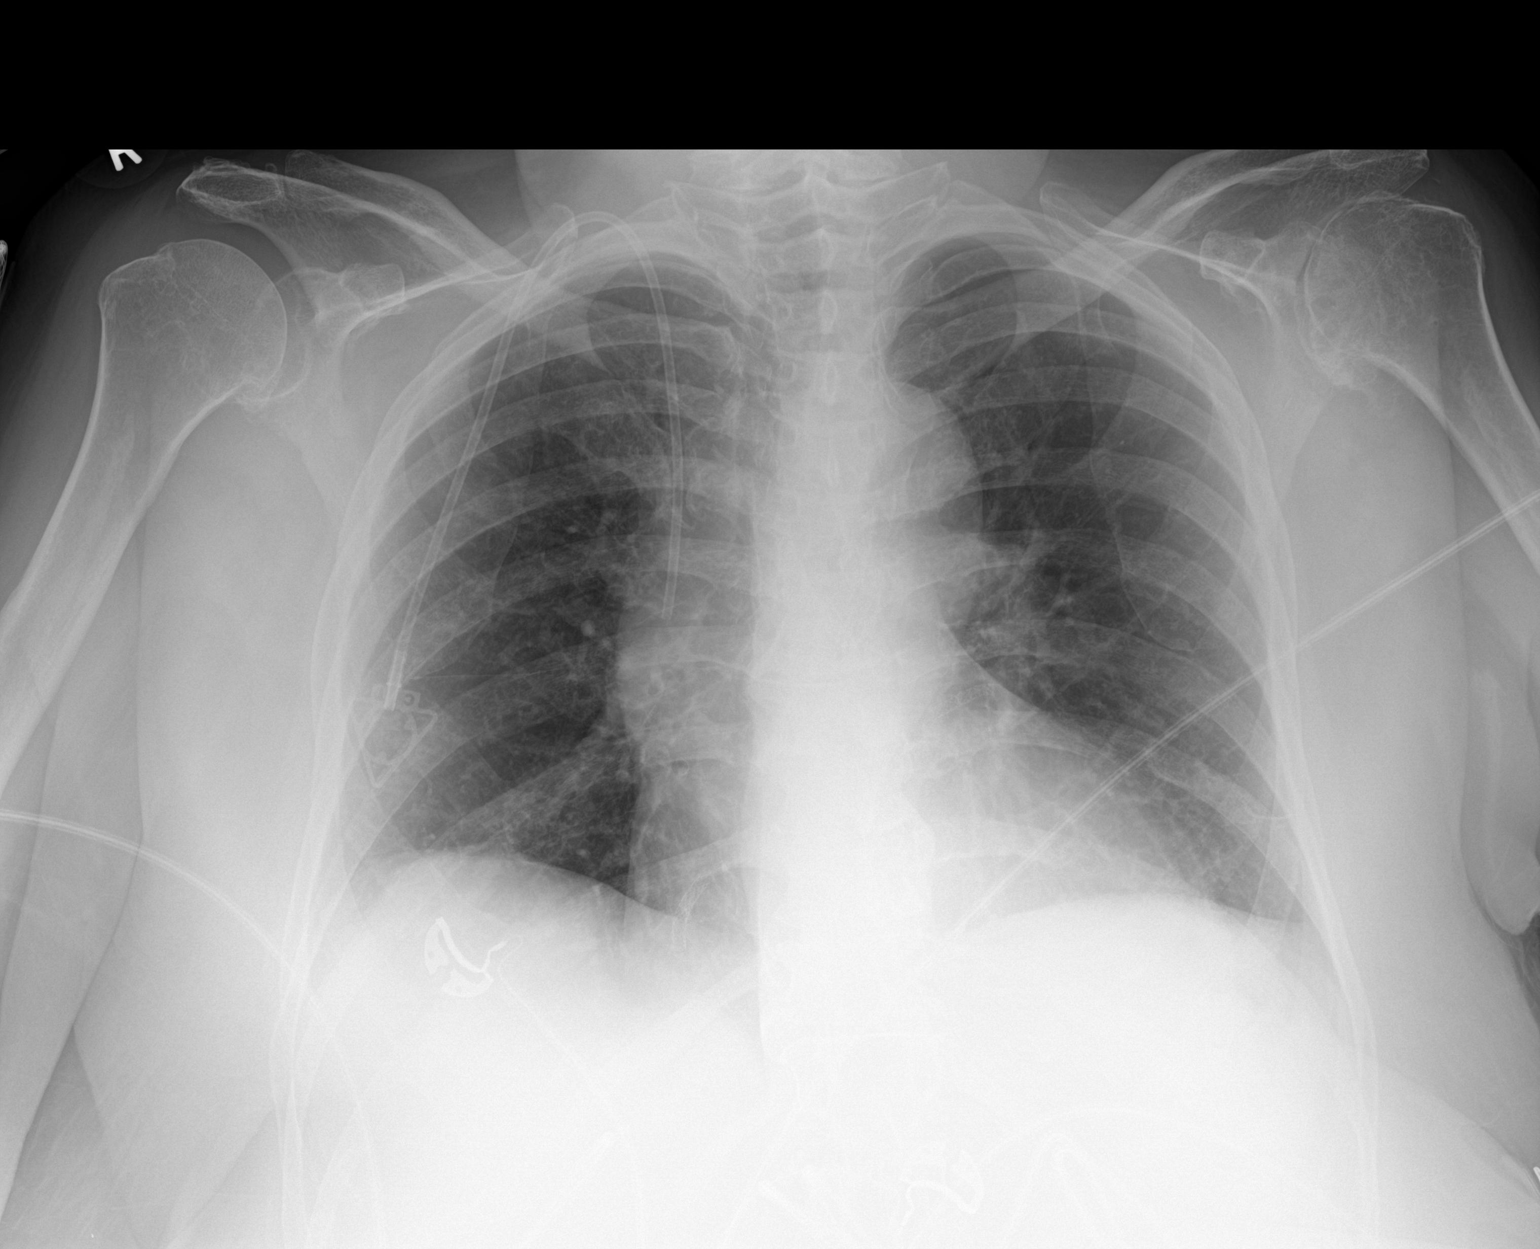

[1 of 1 positions shown; findings below may reference images not displayed]

FINDINGS: There is stable right-sided venous Port-A-Cath positioning.

There is no evidence of acute infiltrate, pleural effusion or
pneumothorax.

The heart size and mediastinal contours are within normal limits.

Degenerative changes seen within the thoracic spine.
IMPRESSION: No active disease.

## 2020-12-14 IMAGING — DX DG CHEST 1V PORT
1 series · 1 of 1 positions shown · non-contrast
Comparison: Chest x-ray dated August 16, 2019.

CLINICAL DATA: Chest pain.

EXAM:
PORTABLE CHEST 1 VIEW

[chest]
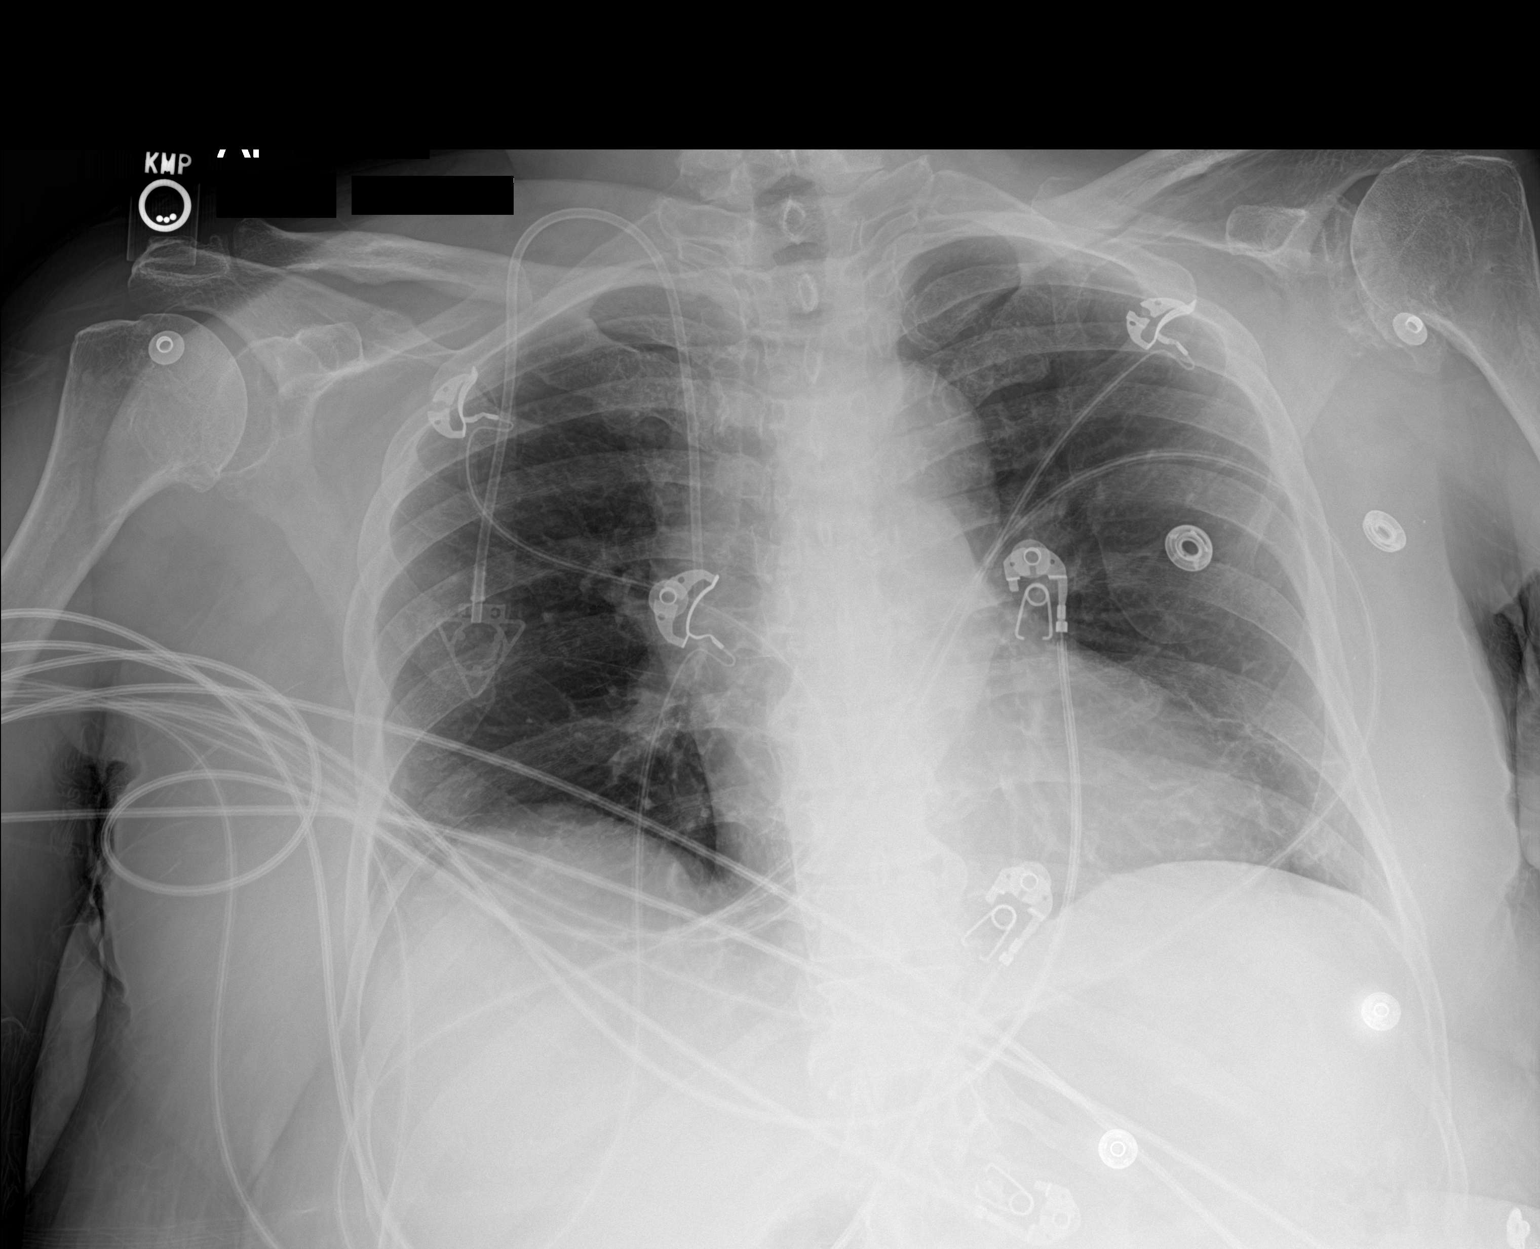

[1 of 1 positions shown; findings below may reference images not displayed]

FINDINGS: Unchanged right chest wall port catheter. The heart size and
mediastinal contours are within normal limits. Normal pulmonary
vascularity. No focal consolidation, pleural effusion, or
pneumothorax. No acute osseous abnormality.
IMPRESSION: No active disease.

## 2020-12-17 IMAGING — RF DG ESOPHAGUS
7 of 9 series · 12 of 24 positions shown · non-contrast
Comparison: None.

CLINICAL DATA: Concern for esophageal dysmotility and reflux, rule
out stricture. History of partial gastrectomy.

EXAM:
ESOPHOGRAM/BARIUM SWALLOW
TECHNIQUE: Single contrast examination was performed using thin barium and
tablet.
FLUOROSCOPY TIME:  Fluoroscopy Time:  4 minutes 6 seconds
Number of Acquired Spot Images: 1

[Series 2: cp_standard · 0.27mm/px · 1 of 1 slices shown (1 of 7)]
[im 1/1]
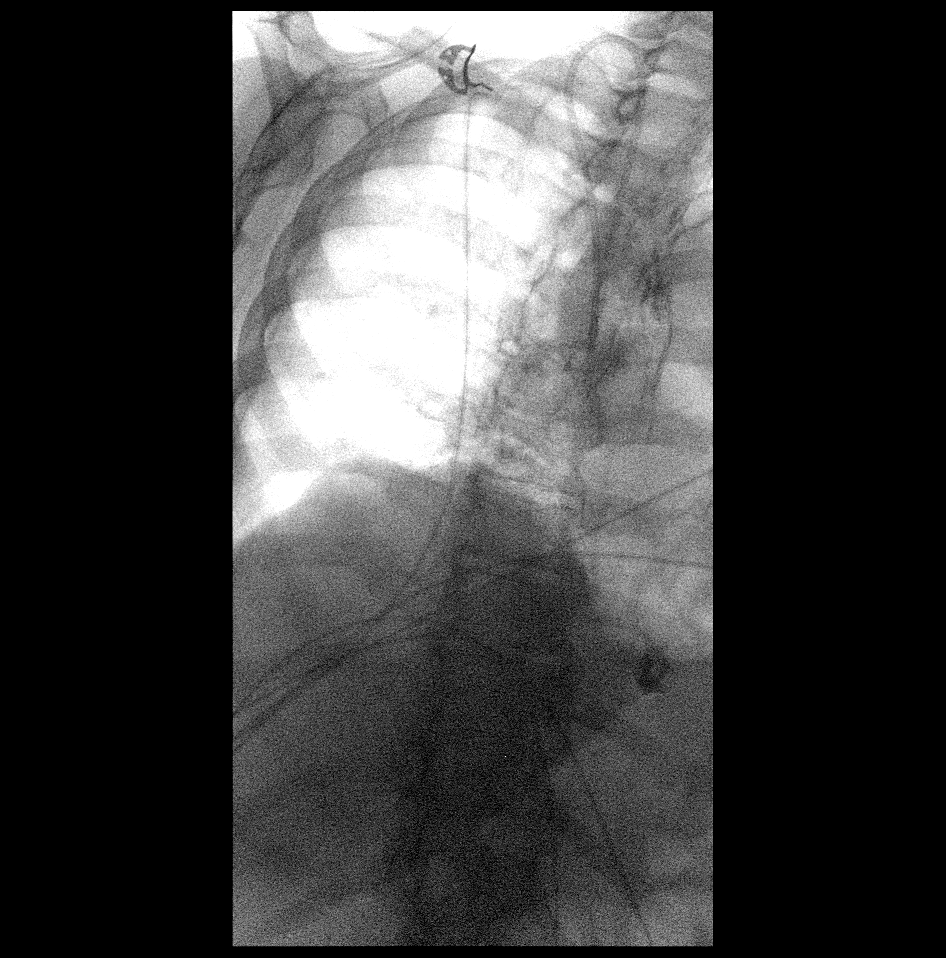

[Series 3: cp_standard · 0.54mm/px · 2 of 268 frames shown (2 of 7)]
[frame 41/268]
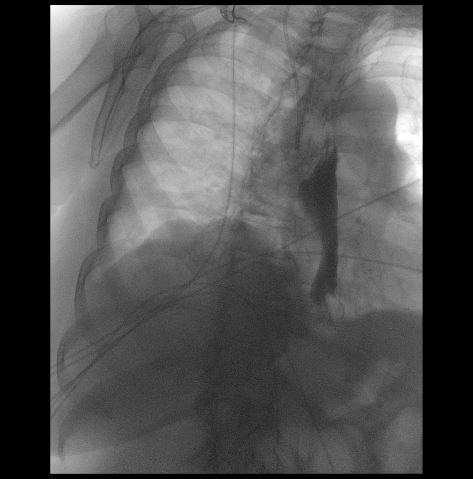
[frame 228/268]
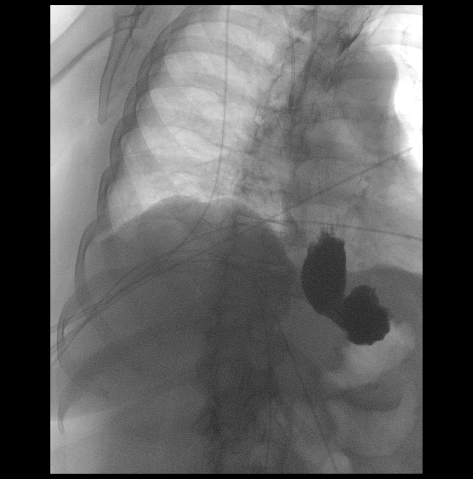

[Series 4: cp_standard · 0.54mm/px · 1 of 31 frames shown (3 of 7)]
[frame 16/31]
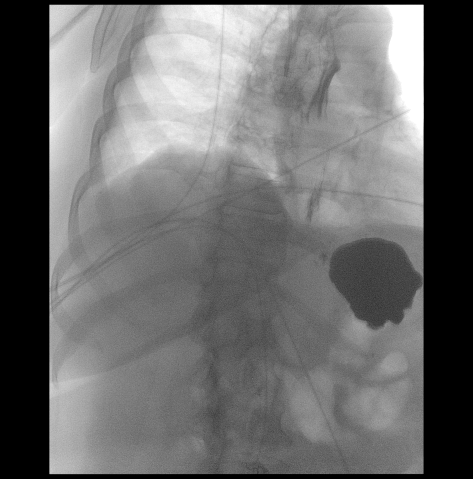

[Series 5: cp_standard · 0.53mm/px · 2 of 72 frames shown (4 of 7)]
[frame 11/72]
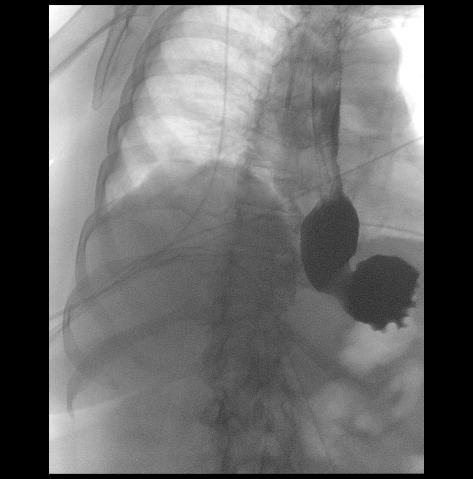
[frame 48/72]
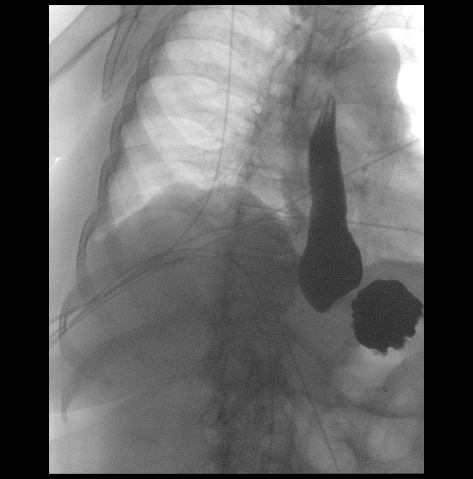

[Series 6: cp_standard · 0.26mm/px · 2 of 4 slices shown (5 of 7)]
[im 1/4]
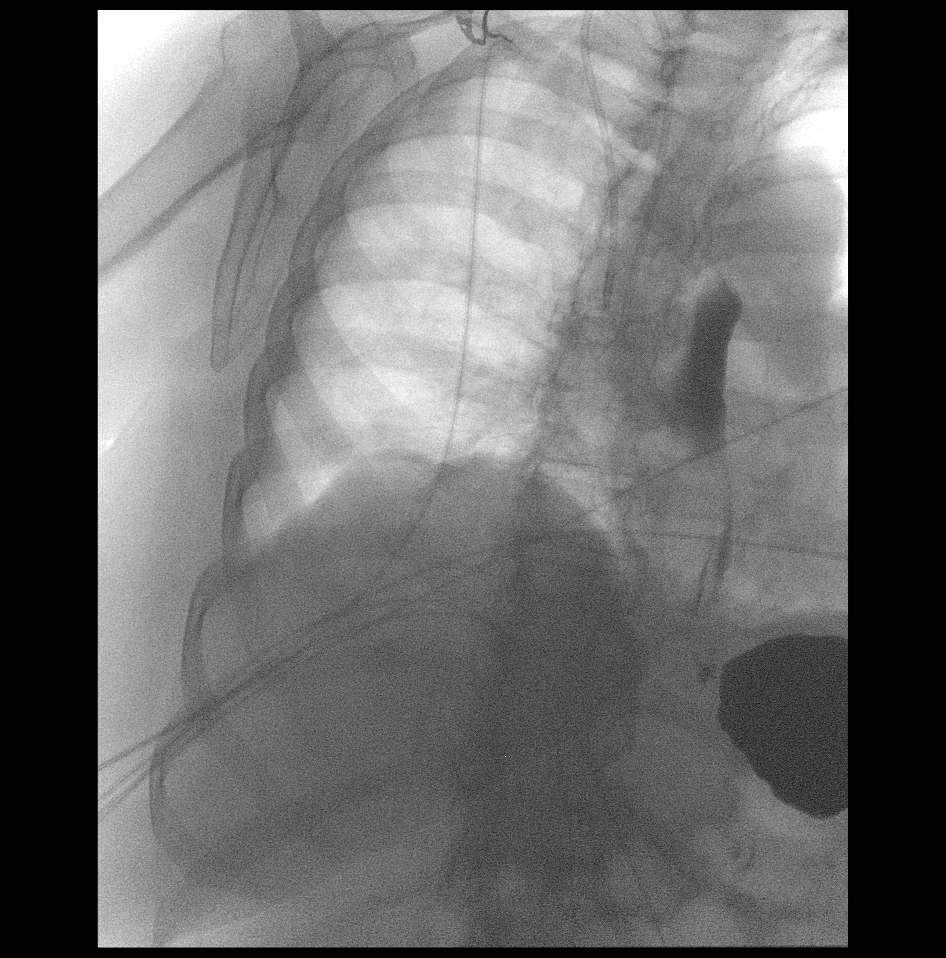
[im 3/4]
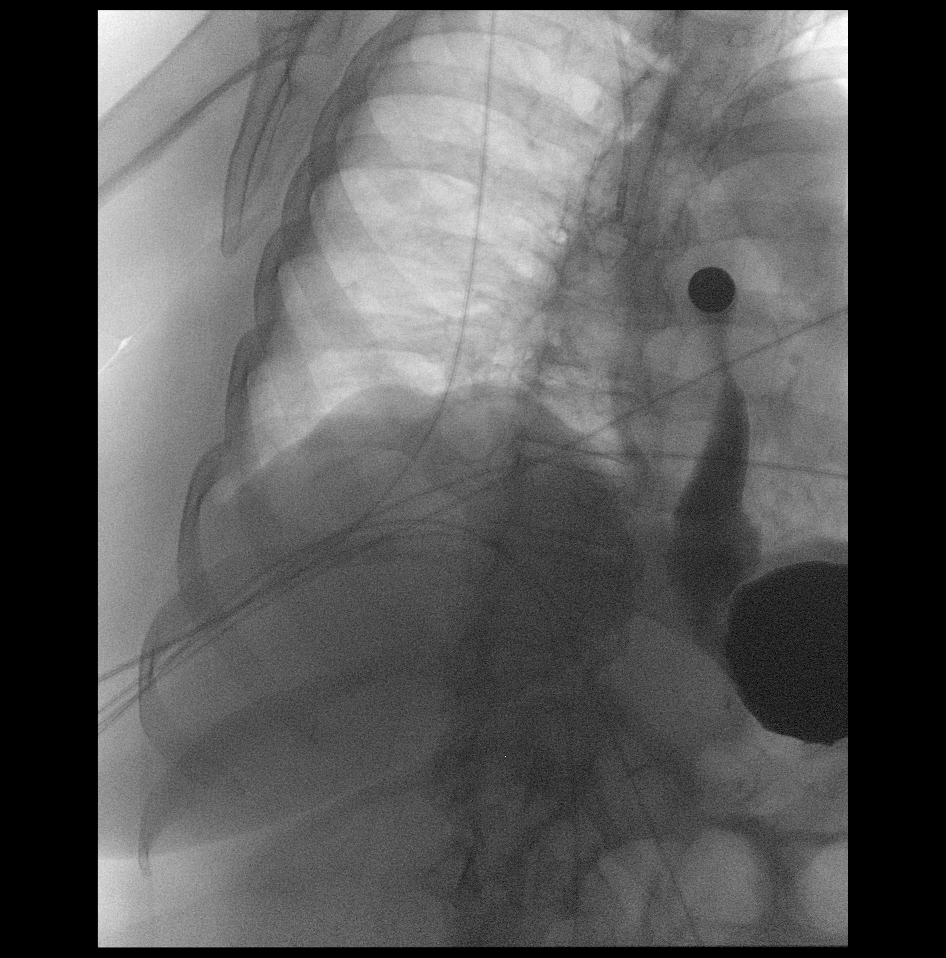

[Series 10: cp_standard · 0.53mm/px · 2 of 235 frames shown (6 of 7)]
[frame 36/235]
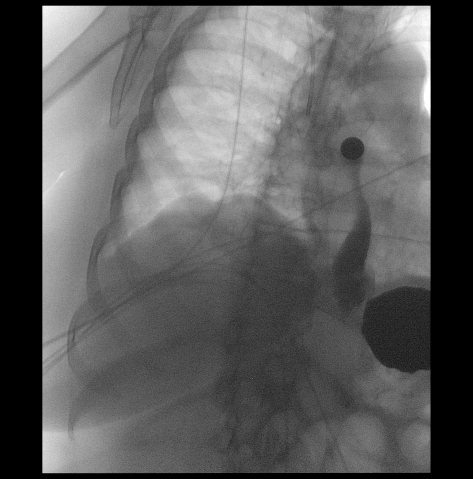
[frame 235/235  full-range]
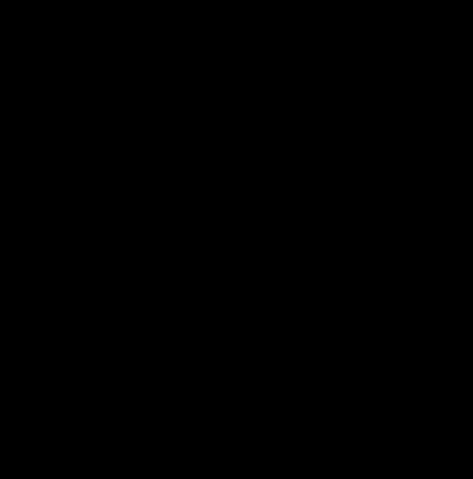

[Series 12: cp_standard · 0.51mm/px · 2 of 3 frames shown (7 of 7)]
[frame 1/3]
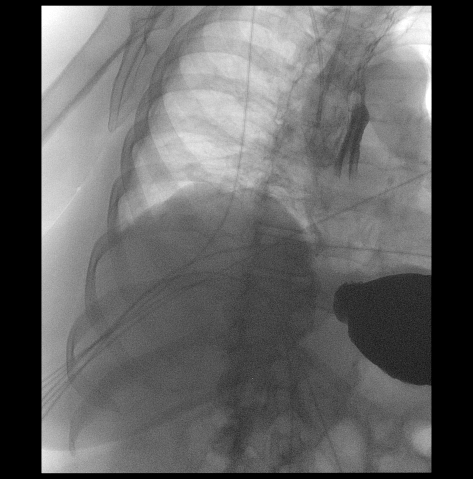
[frame 3/3]
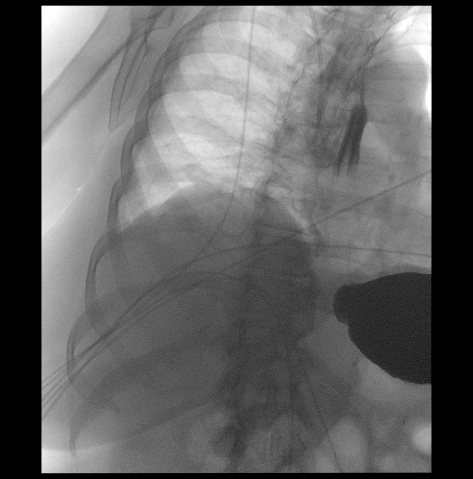

[12 of 24 positions shown; findings below may reference images not displayed]

FINDINGS: Thin barium was administered under fluoroscopic guidance to evaluate
esophageal motility. There is moderate to severe dysmotility with
slow clearance of contrast particularly in the mid to distal portion
of the esophagus. The esophagus completely distends without evidence
of stricture ring. There is evidence of spontaneous as well as
provoked reflux to the level of the mid esophagus. The patient
swallowed a barium tablet which became held up in the mid/distal
portion of the esophagus and did not pass with additional sips of
water. The tablet eventually passed with a sip of thick barium.
IMPRESSION: 1.  Moderate/severe esophageal dysmotility.

2. Spontaneous and provoked reflux to the level of the mid
esophagus.

3. Delayed passage of the barium tablet in the mid/distal esophagus.
The tablet did not pass with additional sips of water but eventually
passed with a sip of thick barium. The esophagus in this region
appeared to fully distend, no fixed stricture identified.

## 2020-12-29 IMAGING — CT CT ORBITS W/O CM
3 series · 14 of 47 positions shown, 16 images · non-contrast
Comparison: 08/10/2019

CLINICAL DATA: Headache, history of gastric cancer

EXAM:
CT ORBITS WITHOUT CONTRAST
TECHNIQUE: Multidetector CT images were obtained using the standard protocol
without intravenous contrast.

[Series 3: orbits wo 2.0 st · axial · 0.37mm/px · z∈[+1167,+1243]mm · 8 of 46 slices shown, 10 images]
[im 4/46  brain]
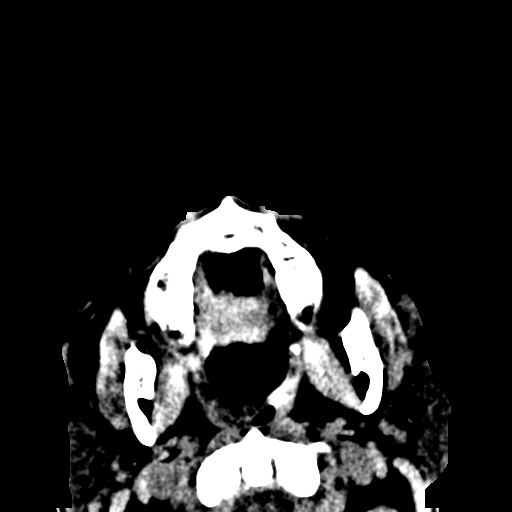
[im 4/46  bone]
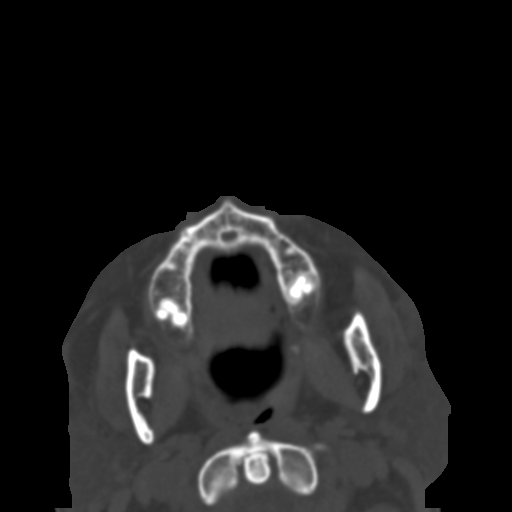
[im 10/46  bone]
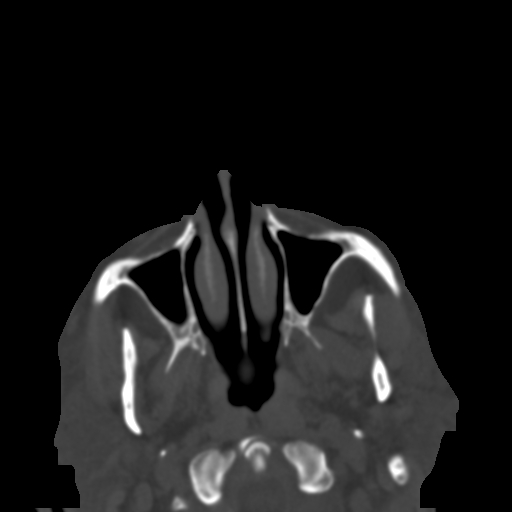
[im 14/46  bone]
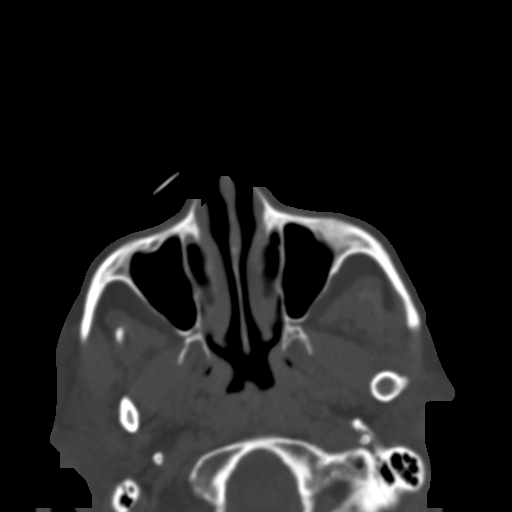
[im 21/46  bone]
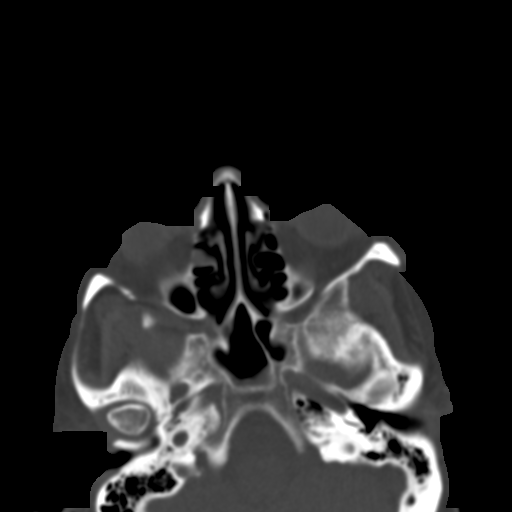
[im 25/46  brain]
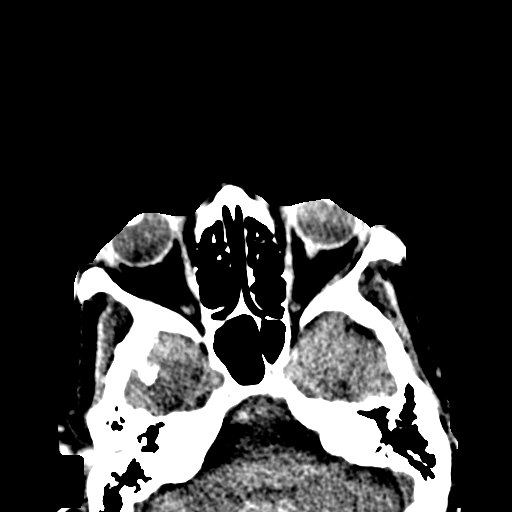
[im 25/46  bone]
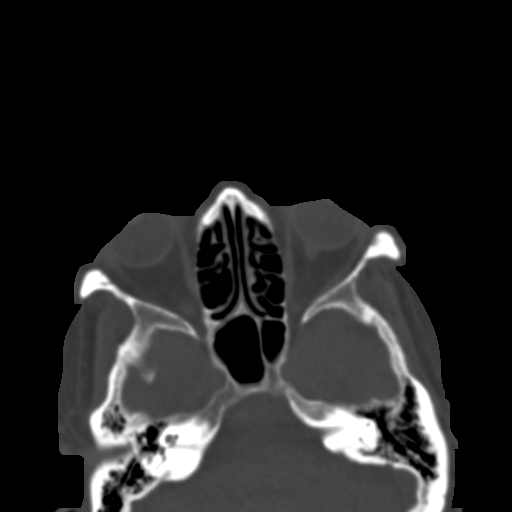
[im 32/46  bone]
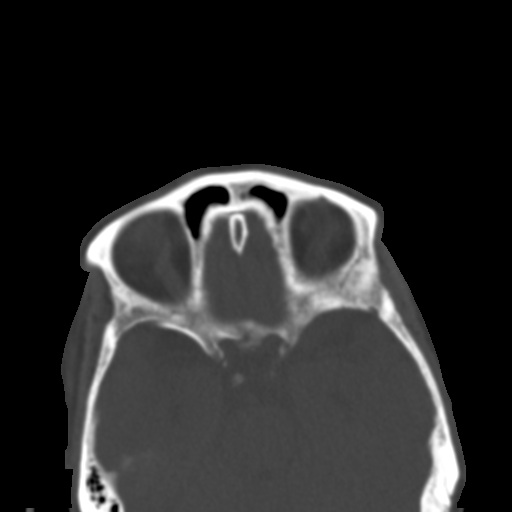
[im 36/46  bone]
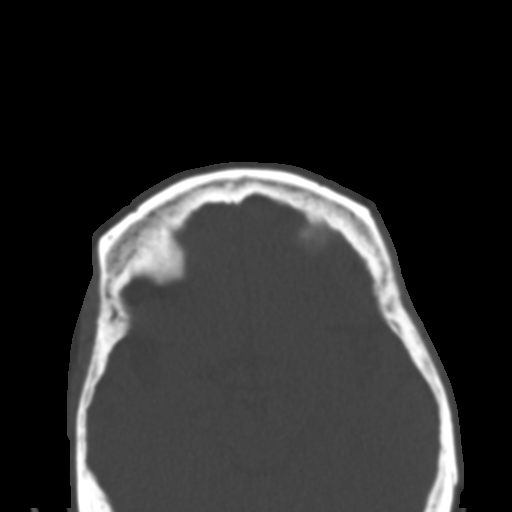
[im 42/46  bone]
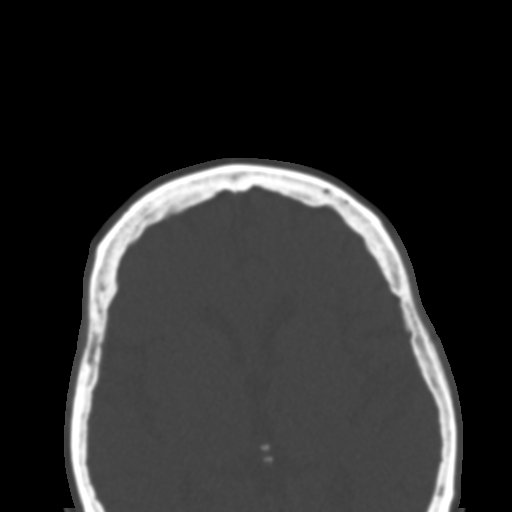

[Series 7: orbits wo 2.0 cor st · coronal · 0.18mm/px · 3 of 71 slices shown]
[im 24/71  bone]
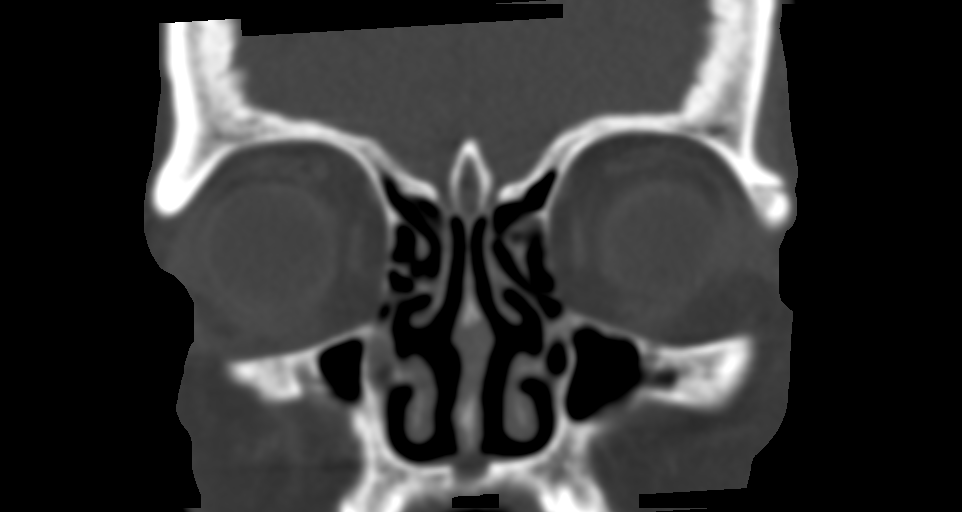
[im 32/71  bone]
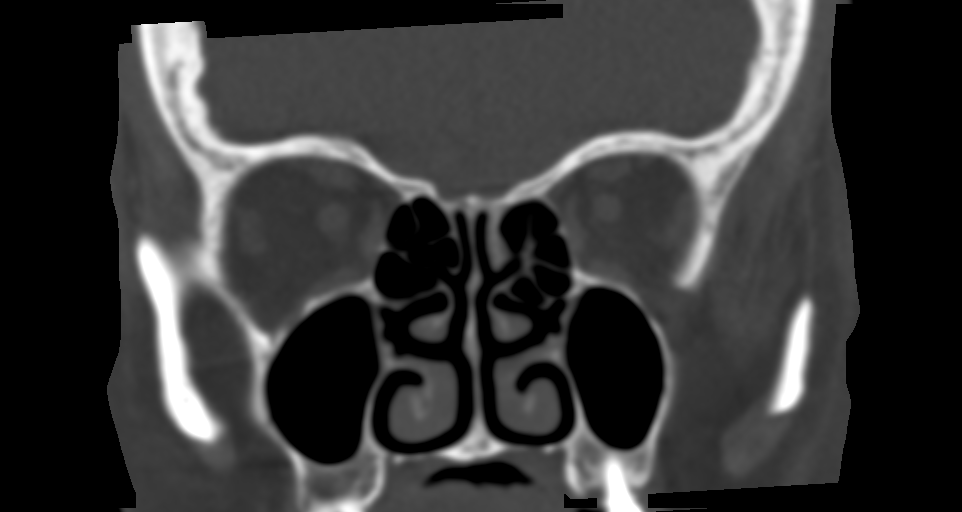
[im 39/71  bone]
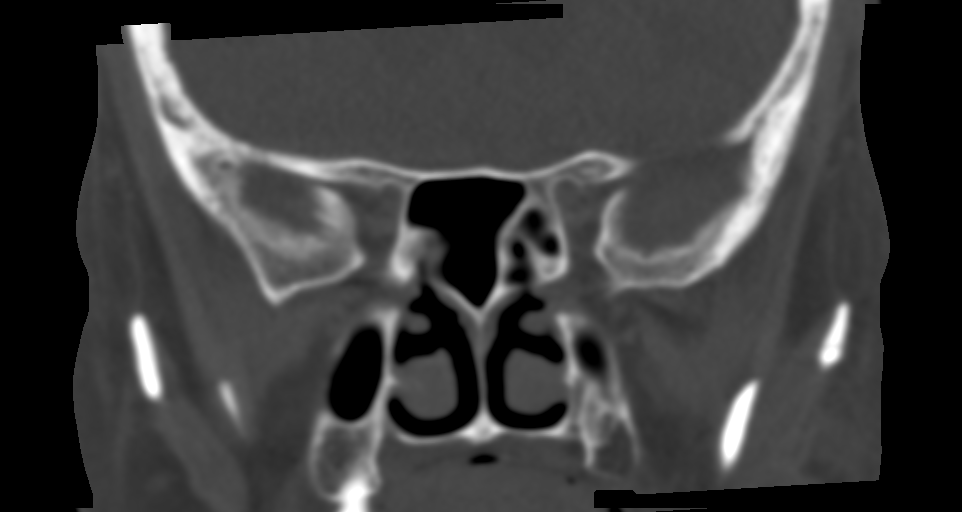

[Series 8: orbits wo 2.0 sag st · sagittal · 0.19mm/px · 3 of 88 slices shown]
[im 30/88  bone]
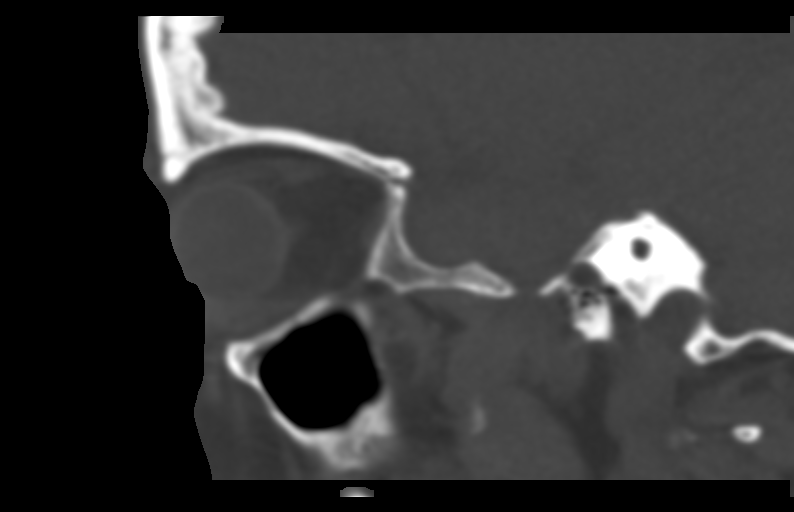
[im 44/88  bone]
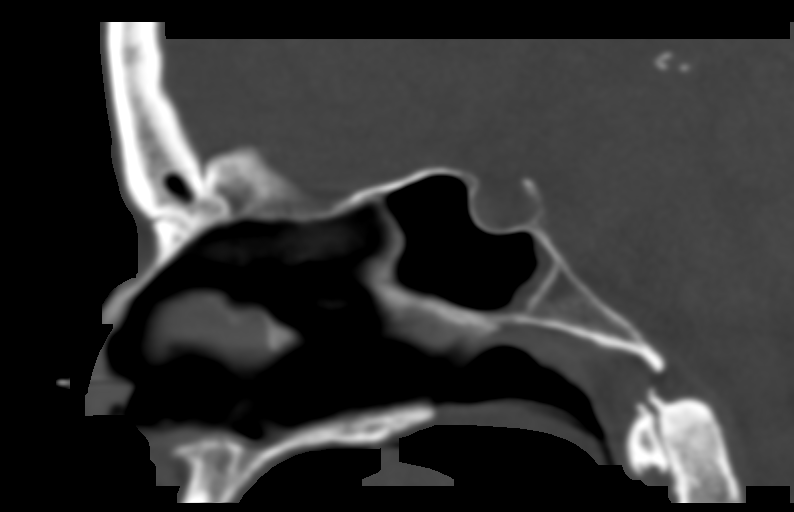
[im 59/88  bone]
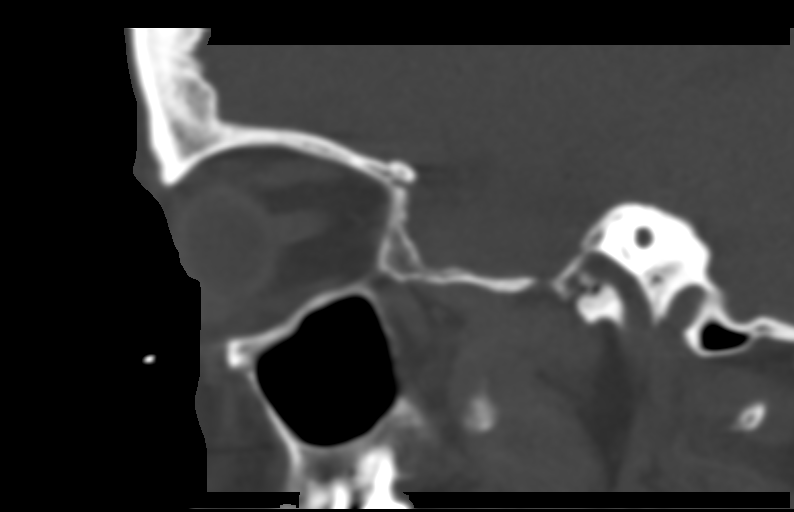

[14 of 47 positions shown; findings below may reference images not displayed]

FINDINGS: Orbits: No traumatic or inflammatory finding. Globes, optic nerves,
orbital fat, extraocular muscles, vascular structures, and lacrimal
glands are normal.

Visualized sinuses: Clear.

Soft tissues: Soft tissues are unremarkable.

Limited intracranial: No significant or unexpected finding.
IMPRESSION: No evidence of traumatic or inflammatory process.

## 2020-12-29 IMAGING — CT CT HEAD W/O CM
4 series · 16 of 47 positions shown, 18 images · non-contrast
Comparison: 08/10/2019

CLINICAL DATA: Ataxia, gastric cancer, sleep apnea

EXAM:
CT HEAD WITHOUT CONTRAST
TECHNIQUE: Contiguous axial images were obtained from the base of the skull
through the vertex without intravenous contrast.

[Series 3: head without · axial · non-contrast · 0.39mm/px · z∈[+1195,+1315]mm · 7 of 33 slices shown, 9 images]
[im 5/33  brain]
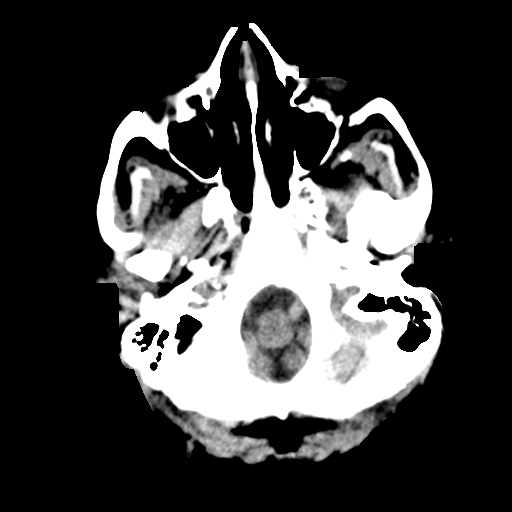
[im 5/33  bone]
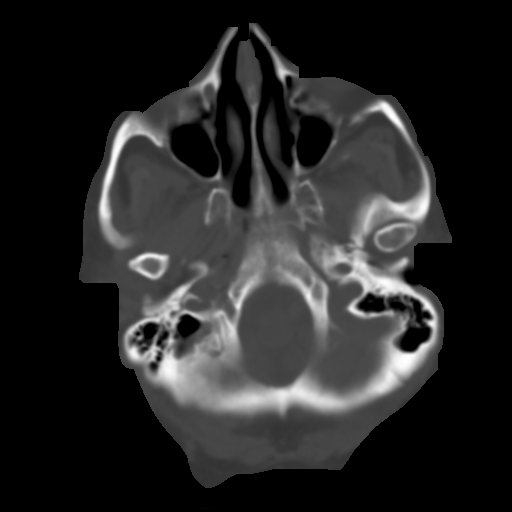
[im 9/33  brain]
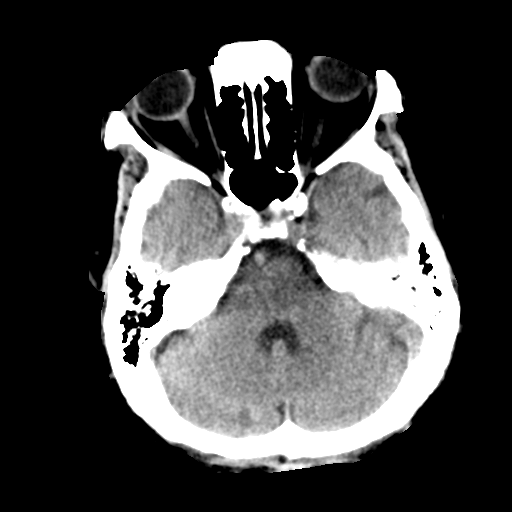
[im 13/33  brain]
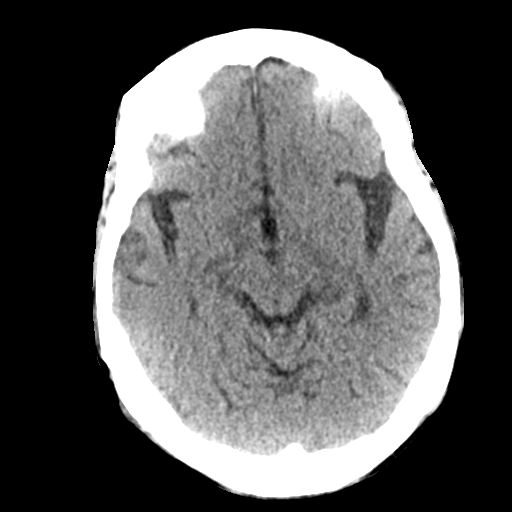
[im 17/33  brain]
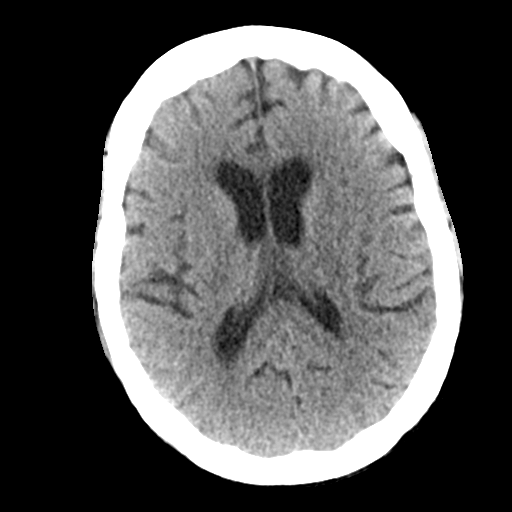
[im 21/33  brain]
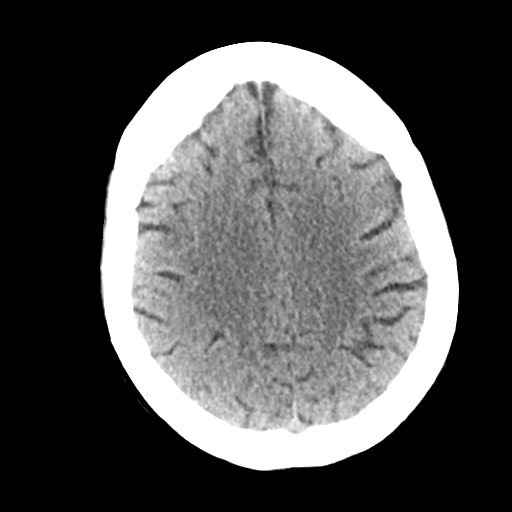
[im 21/33  bone]
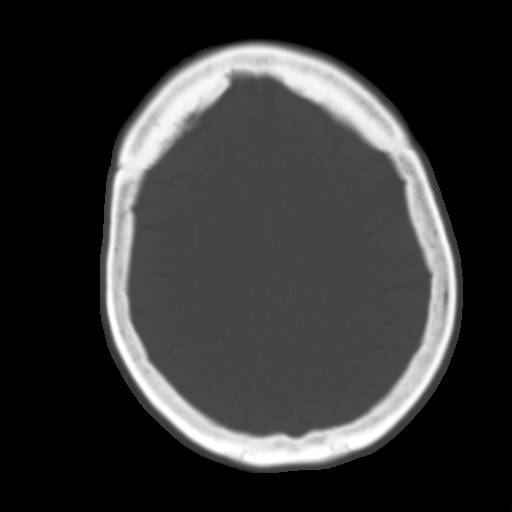
[im 25/33  brain]
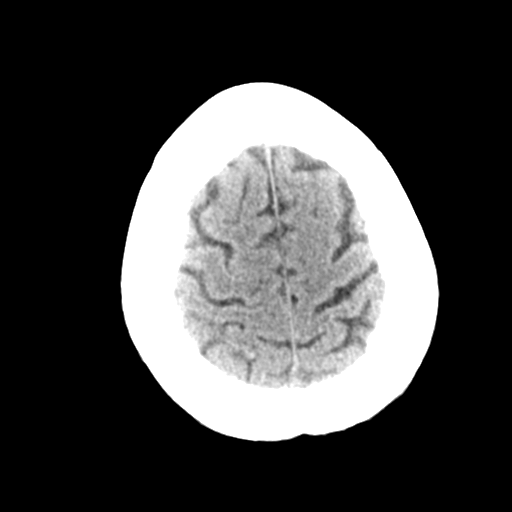
[im 29/33  brain]
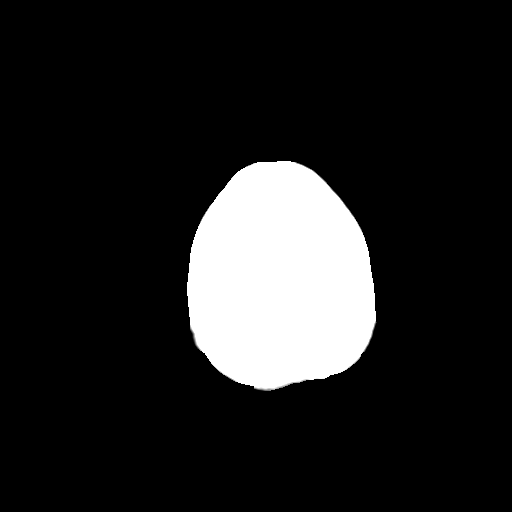

[Series 4: head bone · axial · 0.39mm/px · z∈[+1191,+1223]mm · 3 of 82 slices shown]
[im 9/82  bone]
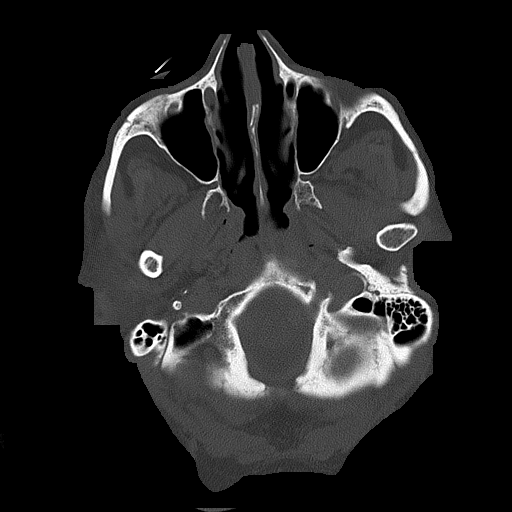
[im 17/82  bone]
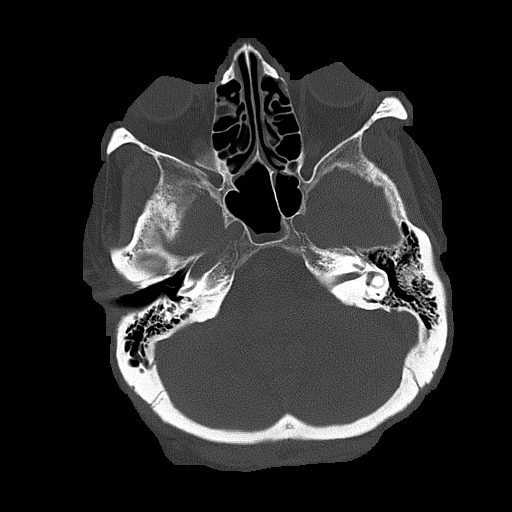
[im 25/82  bone]
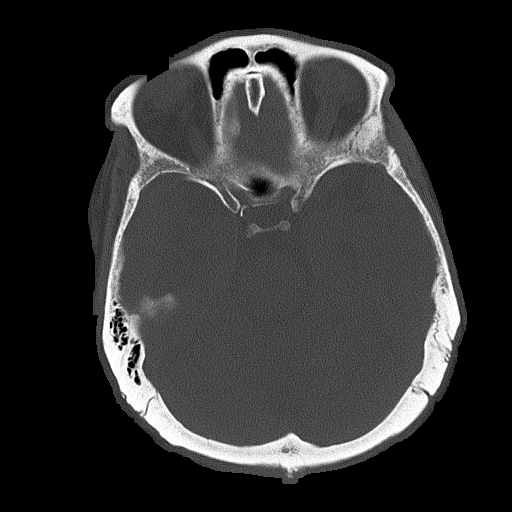

[Series 5: head without cor · coronal · non-contrast · 0.32mm/px · 3 of 67 slices shown]
[im 23/67  brain]
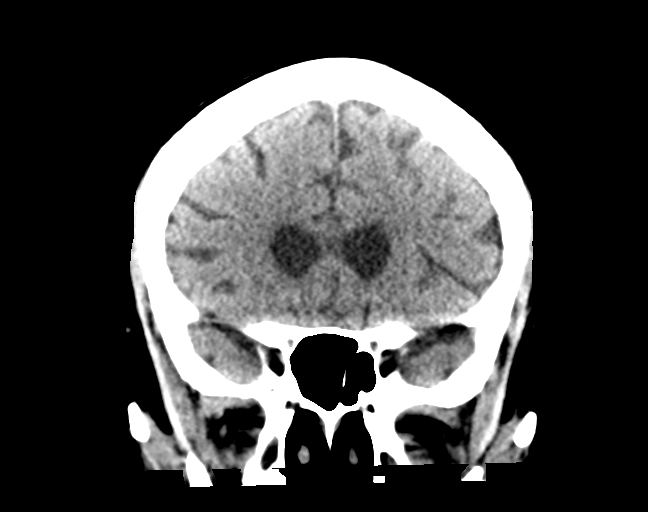
[im 30/67  brain]
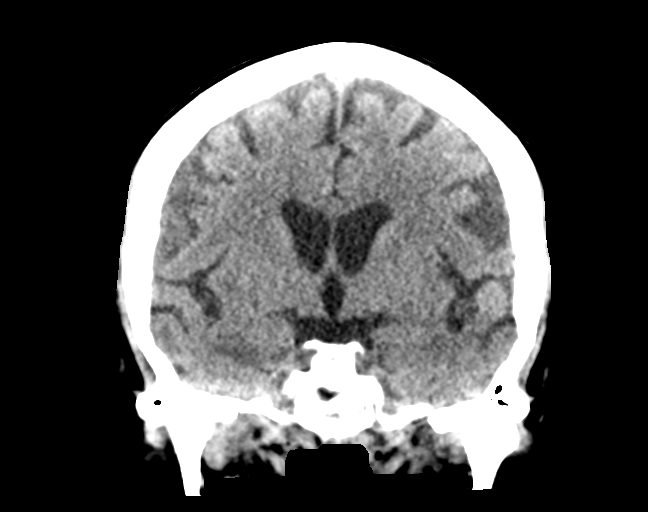
[im 37/67  brain]
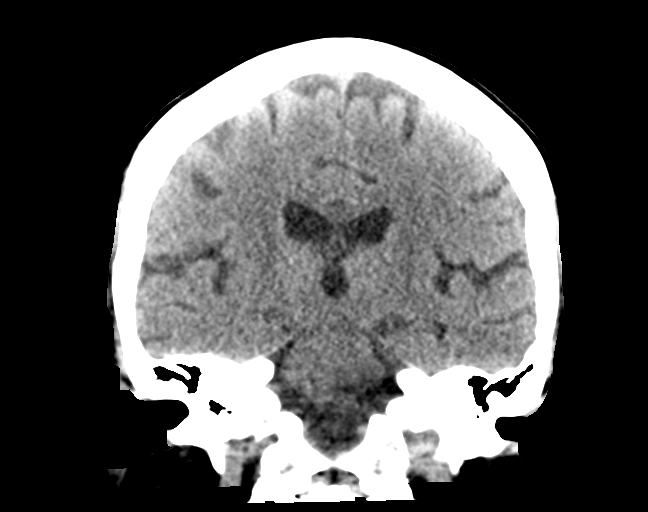

[Series 6: head without sag · sagittal · non-contrast · 0.33mm/px · 3 of 58 slices shown]
[im 20/58  brain]
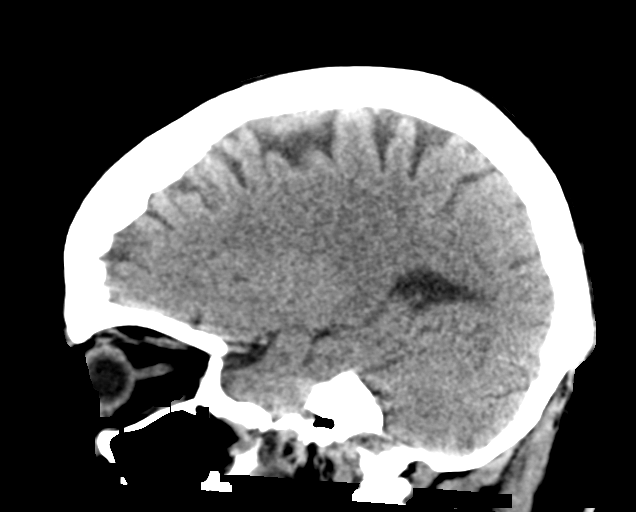
[im 29/58  brain]
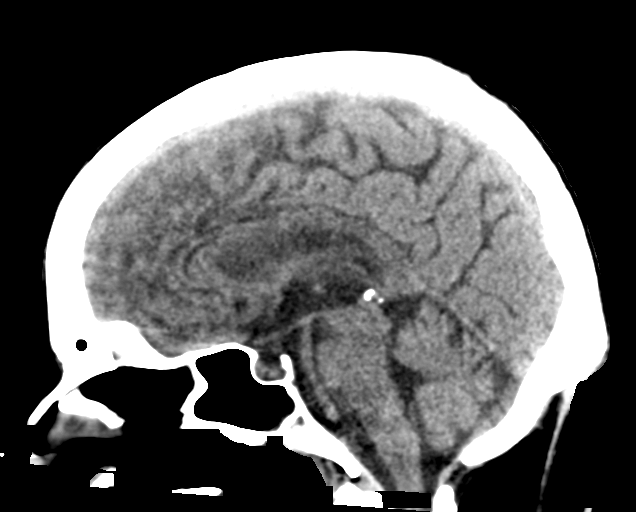
[im 39/58  brain]
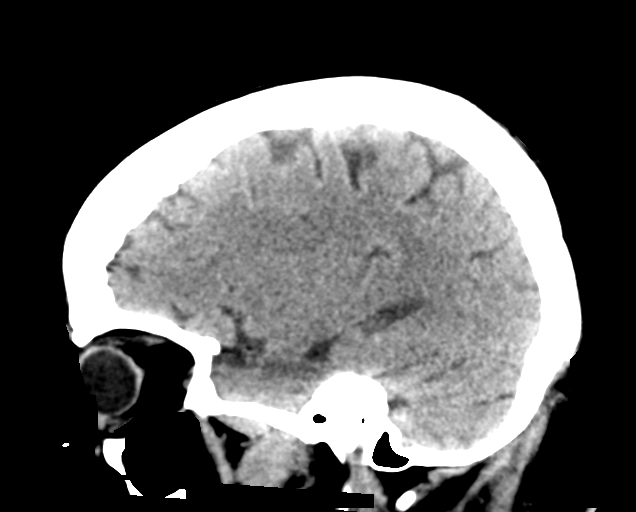

[16 of 47 positions shown; findings below may reference images not displayed]

FINDINGS: Brain: No acute infarct or hemorrhage. Lateral ventricles and
midline structures are stable. No acute extra-axial fluid
collections. No mass effect.

Vascular: No hyperdense vessel or unexpected calcification.

Skull: Normal. Negative for fracture or focal lesion.

Sinuses/Orbits: No acute finding.

Other: None
IMPRESSION: 1. Stable head CT, no acute intracranial process.

## 2020-12-29 IMAGING — DX DG CHEST 1V PORT
1 series · 1 of 1 positions shown · non-contrast
Comparison: Chest radiograph dated 09/02/2019.

CLINICAL DATA: 68-year-old female with hypotension

EXAM:
PORTABLE CHEST 1 VIEW

[chest]
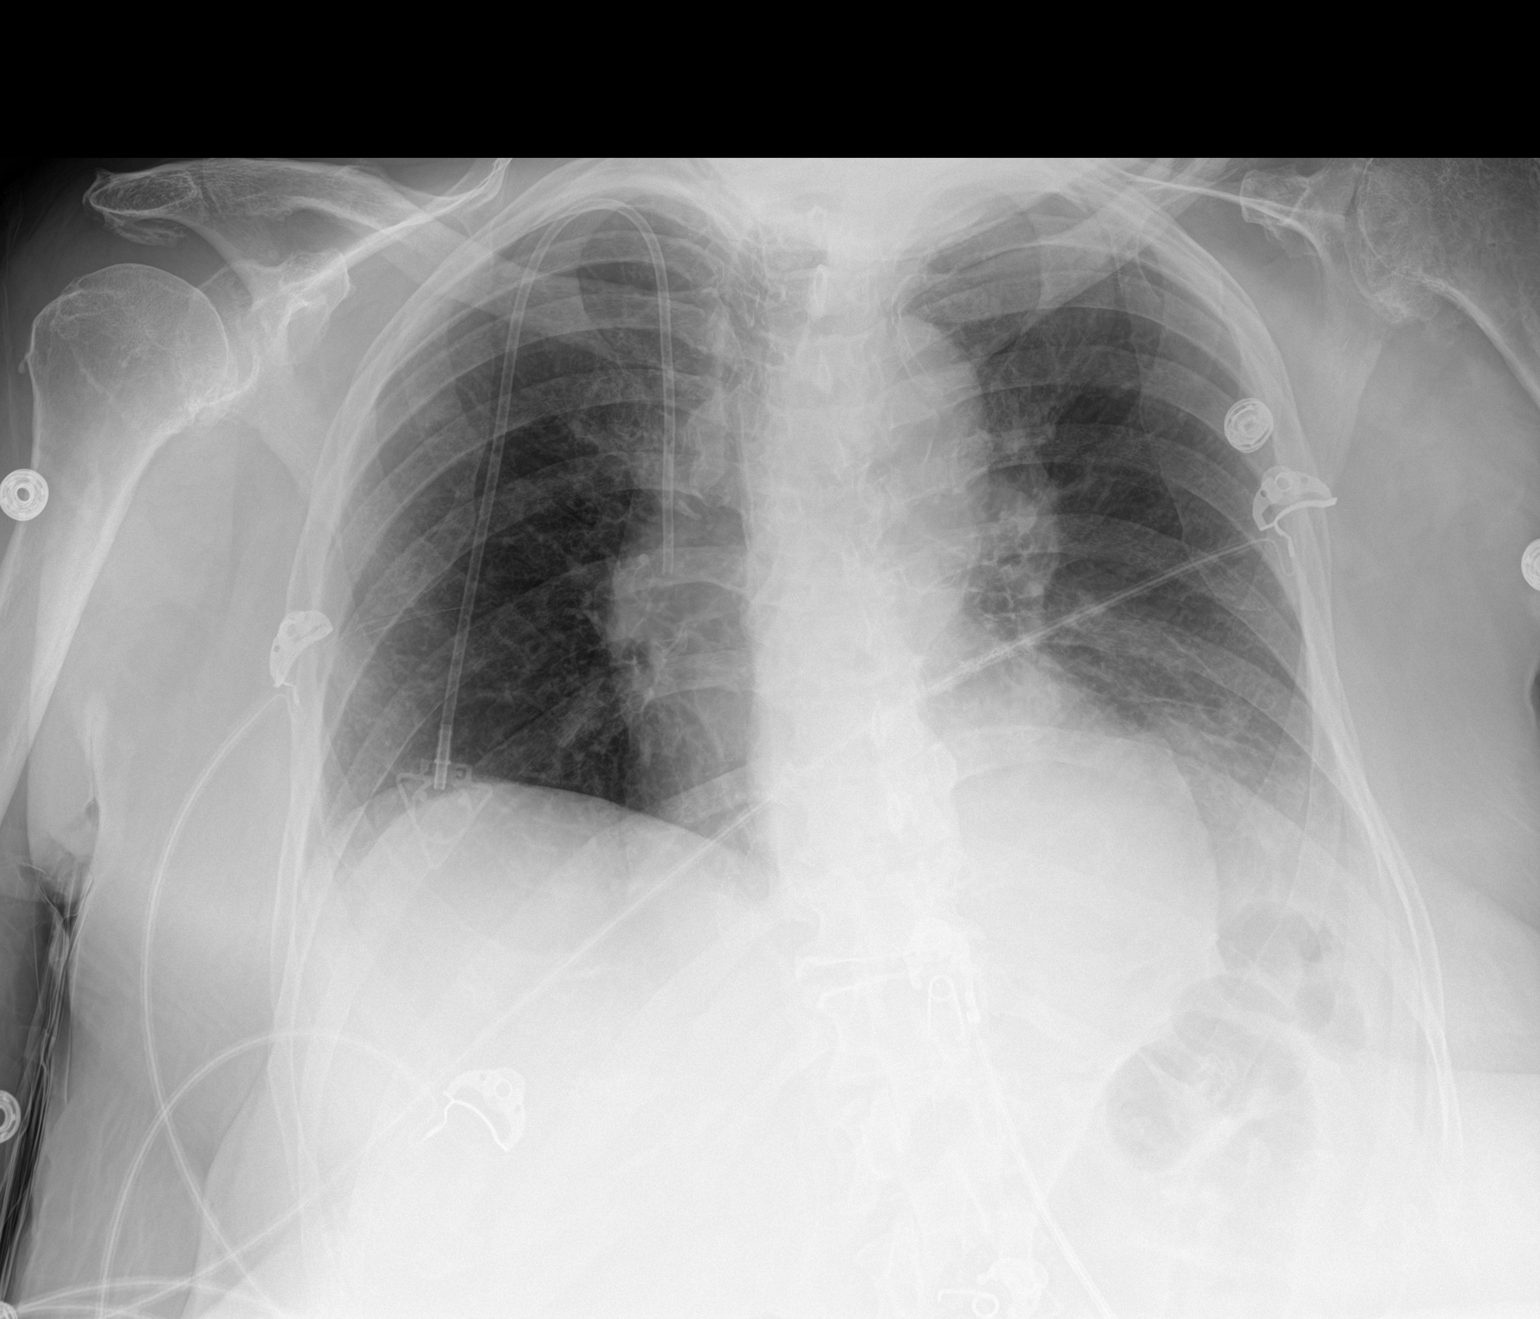

[1 of 1 positions shown; findings below may reference images not displayed]

FINDINGS: Right-sided PICC with tip over central SVC in similar position. New
small left pleural effusion and left lung base atelectasis.
Infiltrate is not excluded. Clinical correlation is recommended. The
right lung remains clear. No pneumothorax. Stable cardiomediastinal
silhouette. No acute osseous pathology.
IMPRESSION: Small left pleural effusion and left lung base atelectasis or
infiltrate. Clinical correlation is recommended. No other interval
change.

## 2021-01-01 IMAGING — DX DG ABDOMEN 1V
1 series · 1 of 1 positions shown · non-contrast
Comparison: Abdominal film 08/10/2019

CLINICAL DATA: Feeding tube placement

EXAM:
ABDOMEN - 1 VIEW

[abdomen]
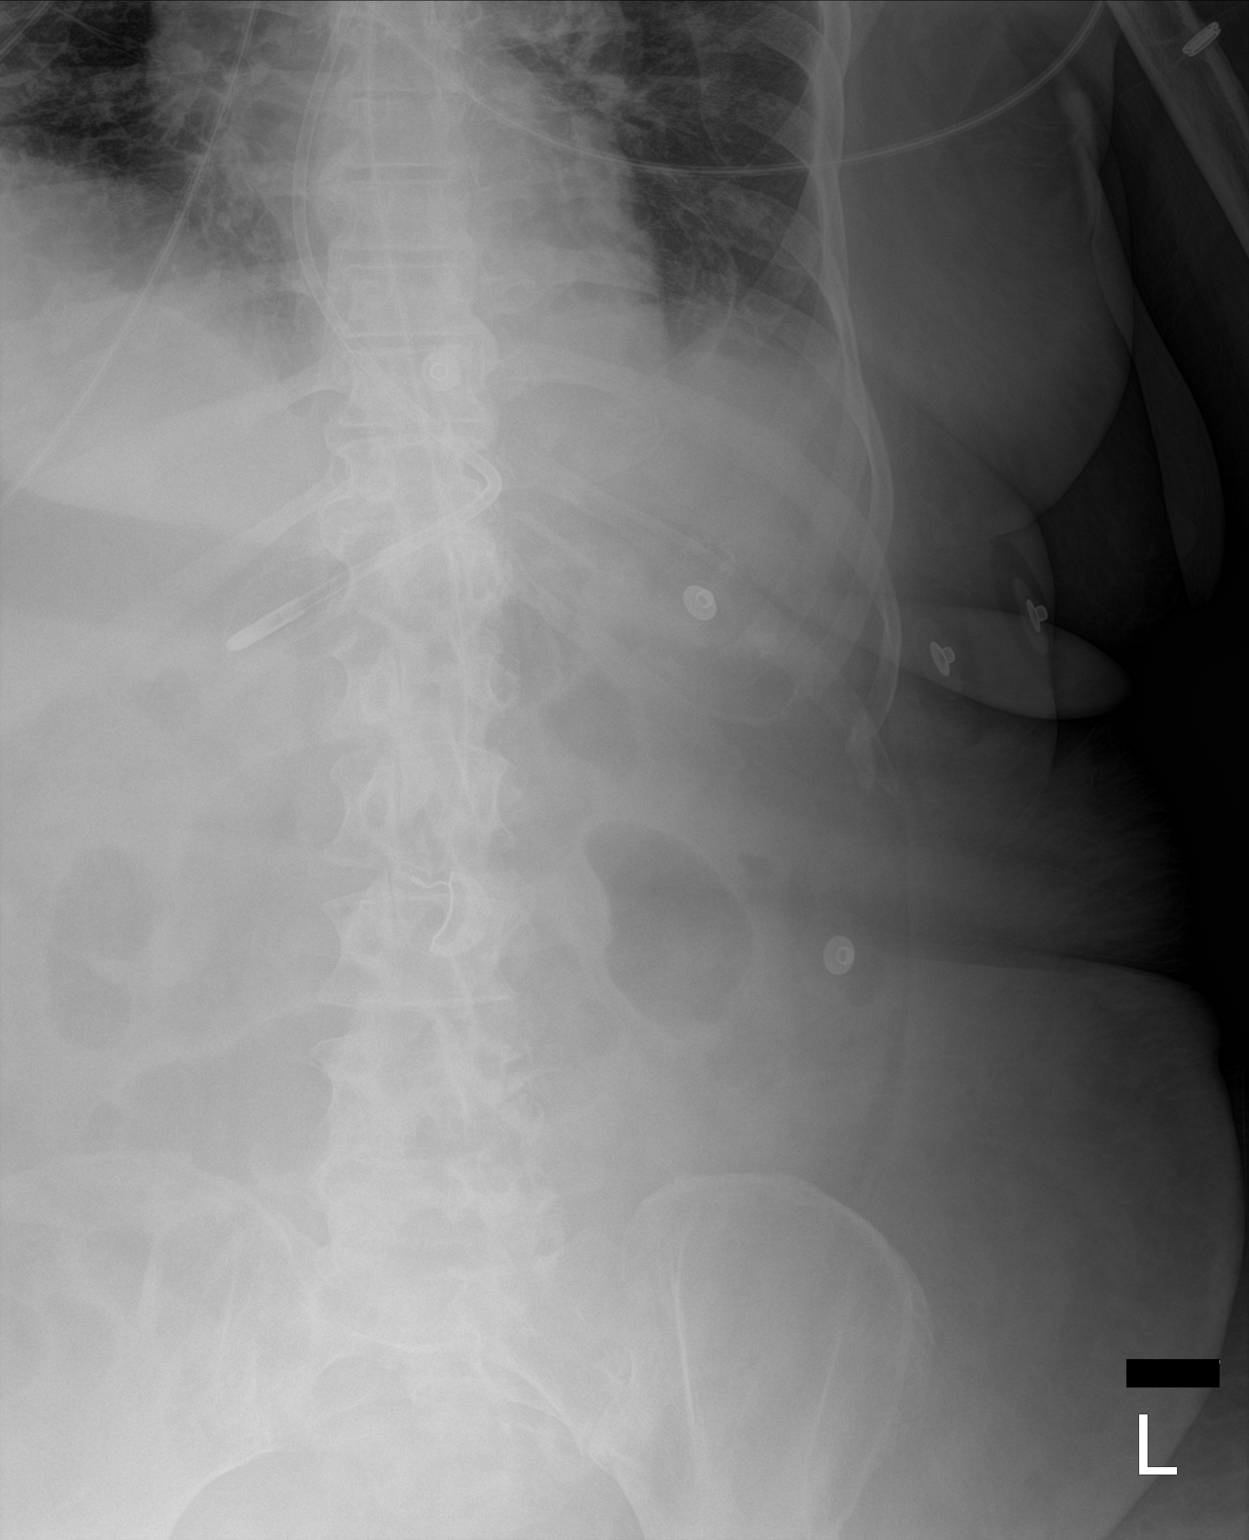

[1 of 1 positions shown; findings below may reference images not displayed]

FINDINGS: Feeding tube in the mid to distal stomach.

Limited assessment of the abdomen with scattered loops of gas-filled
bowel, potentially both large and small bowel, no overt signs of
obstruction with limited assessment, exclusion right flank from
today's examination. No acute bone finding. Potential small left
effusion.
IMPRESSION: 1. Feeding tube in the mid to distal stomach.
2. Potential small left effusion.

## 2021-01-06 IMAGING — MR MR HEAD W/O CM
7 of 12 series · 26 of 48 positions shown · non-contrast
Comparison: Head CT September 24, 2019

CLINICAL DATA: Vision loss, binocular.

EXAM:
MRI HEAD WITHOUT CONTRAST
TECHNIQUE: Multiplanar, multiecho pulse sequences of the brain and surrounding
structures were obtained without intravenous contrast.

[Series 2: DWI · axial · 3.0mm · 0.94mm/px · z∈[-75,+71]mm · 6 of 100 slices shown (1 of 3)]
[im 1/100]
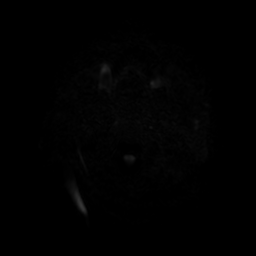
[im 20/100]
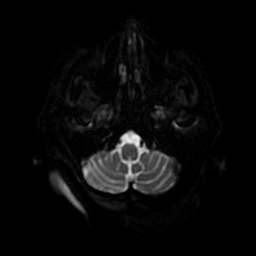
[im 40/100]
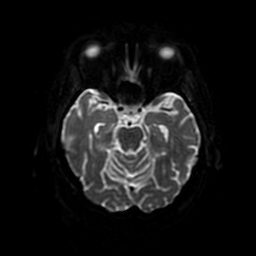
[im 60/100]
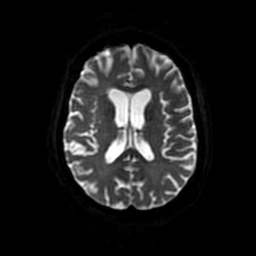
[im 80/100]
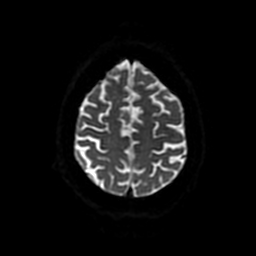
[im 100/100]
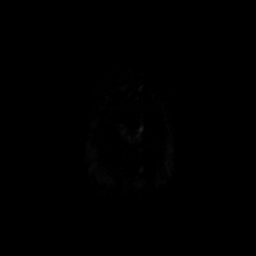

[Series 3: DWI · coronal · 4.0mm · 0.94mm/px · 4 of 70 slices shown (2 of 3)]
[im 1/70]
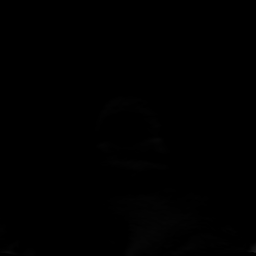
[im 24/70]
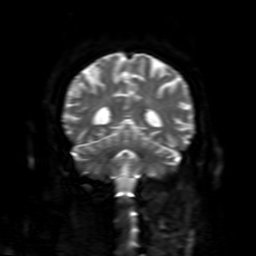
[im 47/70]
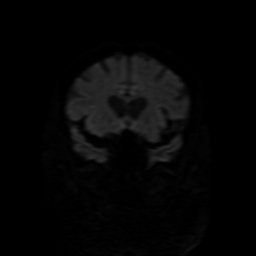
[im 70/70]
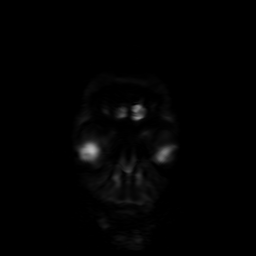

[Series 4: FLAIR · sagittal · 5.0mm · 0.23mm/px · 2 of 24 slices shown (1 of 2)]
[im 1/24]
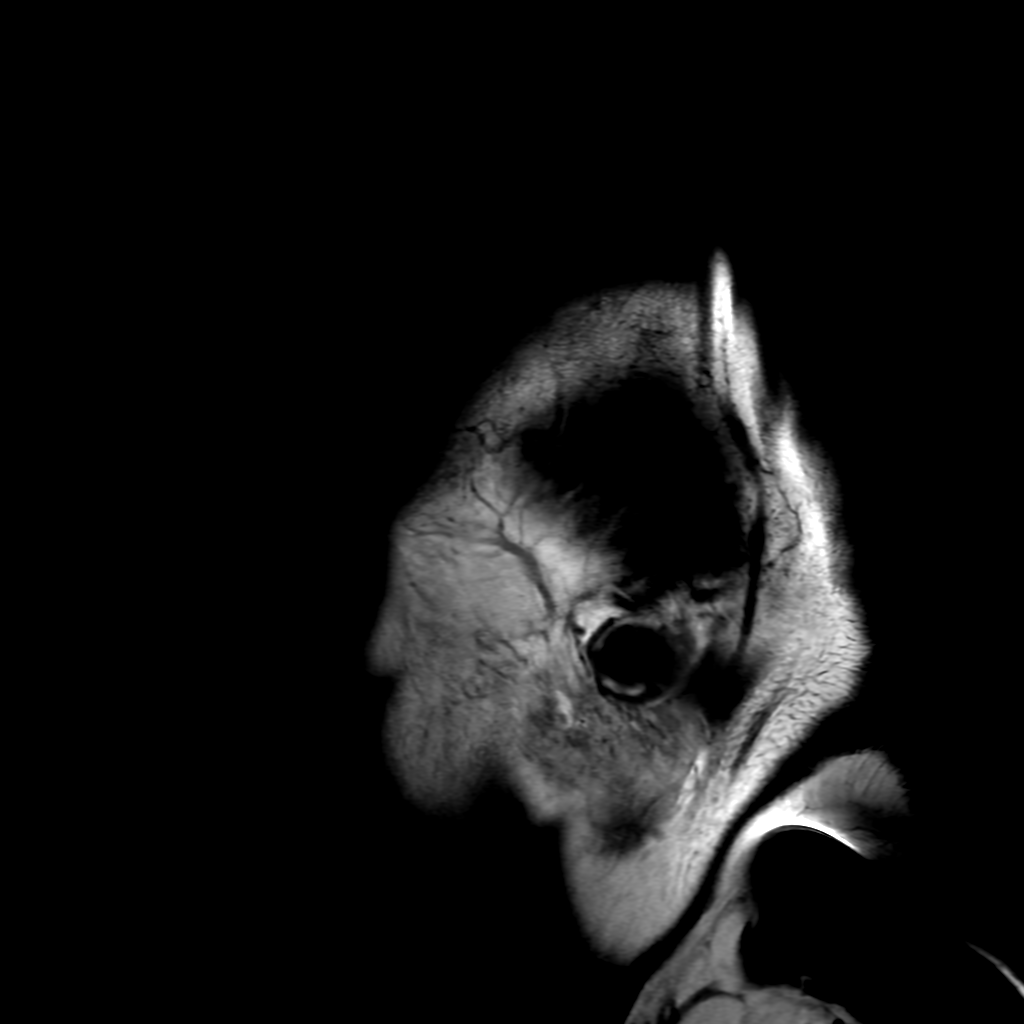
[im 24/24]
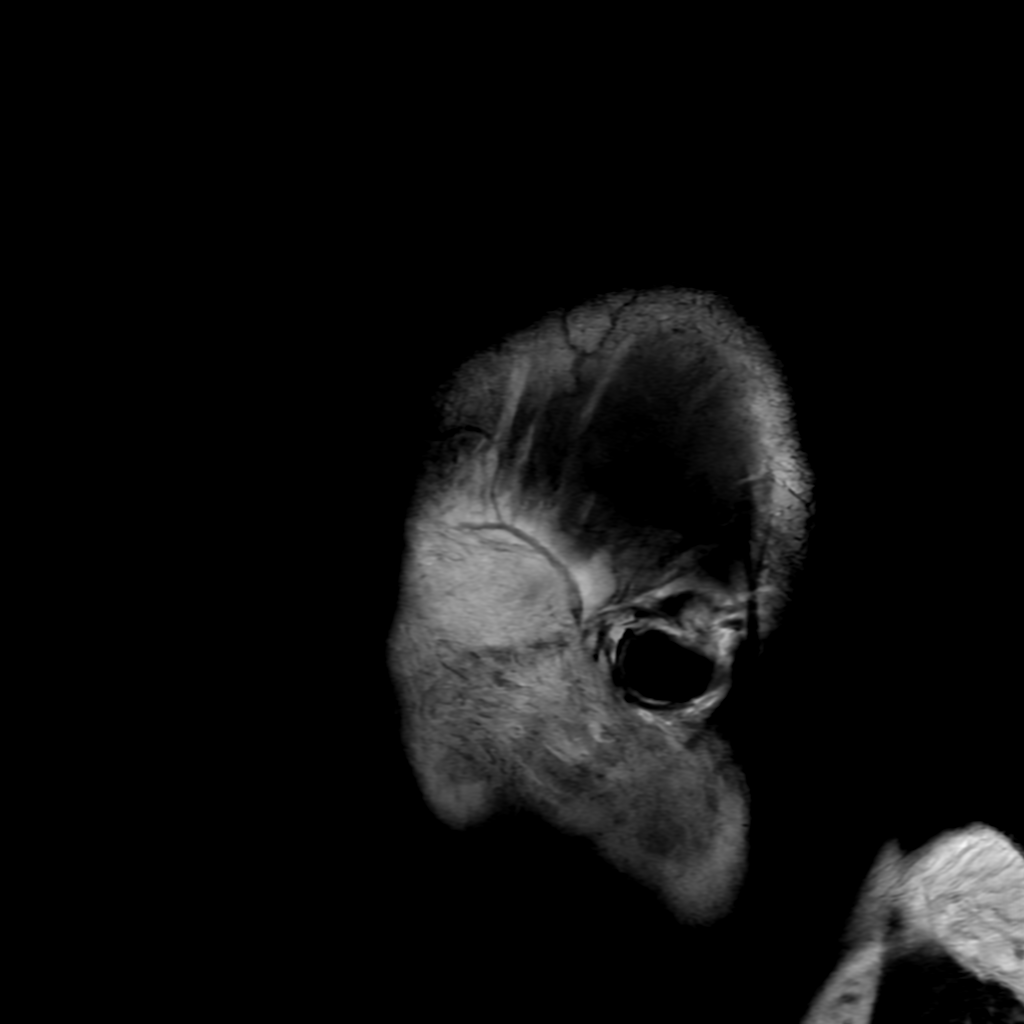

[Series 6: FLAIR · axial · 3.0mm · 0.41mm/px · z∈[-59,+73]mm · 2 of 23 slices shown (2 of 2)]
[im 1/23]
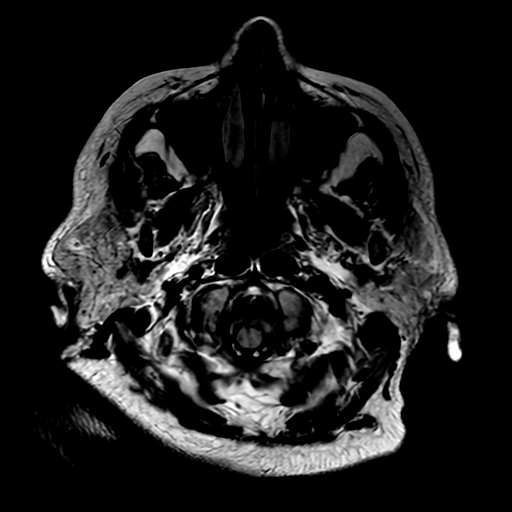
[im 23/23]
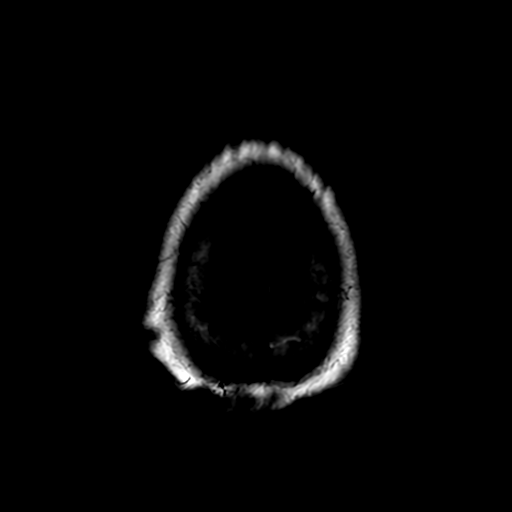

[Series 210: DWI · axial · 3.0mm · 0.94mm/px · z∈[-75,+71]mm · 7 of 100 slices shown (3 of 3)]
[im 1/100]
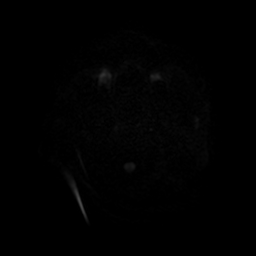
[im 17/100]
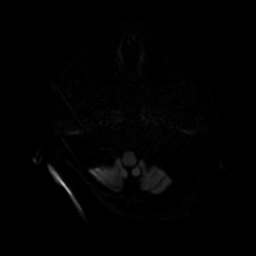
[im 34/100]
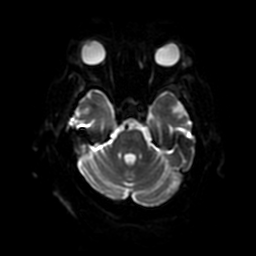
[im 50/100]
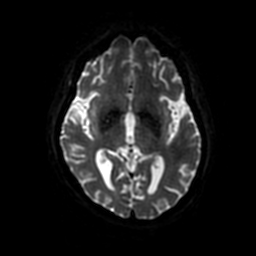
[im 67/100]
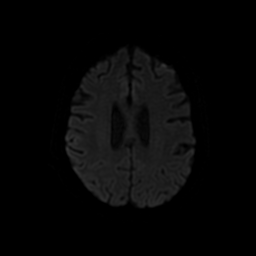
[im 83/100]
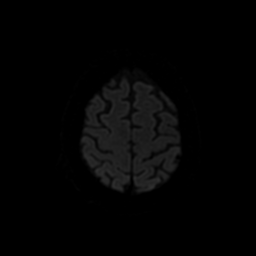
[im 100/100]
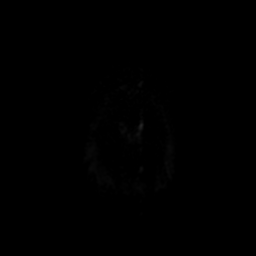

[Series 250: ADC · axial · 3.0mm · 0.94mm/px · z∈[-75,+71]mm · 3 of 50 slices shown (1 of 2)]
[im 1/50]
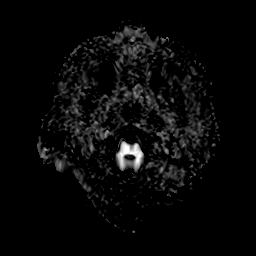
[im 25/50]
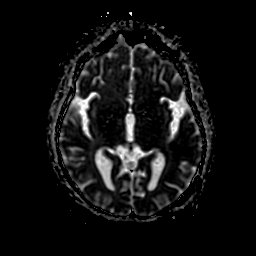
[im 50/50]
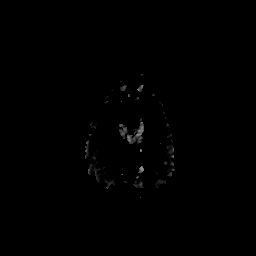

[Series 350: ADC · coronal · 4.0mm · 0.94mm/px · 2 of 35 slices shown (2 of 2)]
[im 1/35]
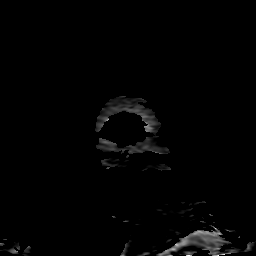
[im 35/35]
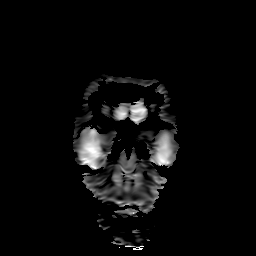

[26 of 48 positions shown; findings below may reference images not displayed]

FINDINGS: Brain: No acute infarction, hemorrhage, hydrocephalus, extra-axial
collection or mass lesion.

Remote lacunar infarcts seen in the right cerebellar hemisphere. A
few scattered foci of T2 hyperintensity are seen within the white
matter of the cerebral hemispheres, nonspecific, most likely related
to chronic small vessel disease.

Vascular: Normal flow voids.

Skull and upper cervical spine: Normal marrow signal.

Sinuses/Orbits: Mucosal thickening is noted in sphenoid sinus and
ethmoid cells. The orbits are maintained.

Other: None.
IMPRESSION: 1. No acute intracranial abnormality.
2. Remote lacunar infarcts in the right cerebellar hemisphere.
3. Mild chronic small vessel disease.

## 2021-02-02 IMAGING — CT CT ABD-PELV W/O CM
2 of 5 series · 16 of 46 positions shown, 18 images · non-contrast
Comparison: 09/20/2019, 08/08/2019

CLINICAL DATA: Acute nonlocalized abdominal pain

EXAM:
CT ABDOMEN AND PELVIS WITHOUT CONTRAST
TECHNIQUE: Multidetector CT imaging of the abdomen and pelvis was performed
following the standard protocol without IV contrast.

[Series 3: abd/ pelvis 5.0 i30f 2 · axial · 0.98mm/px · z∈[+886,+1281]mm · 13 of 87 slices shown, 15 images]
[im 4/87  soft-tissue]
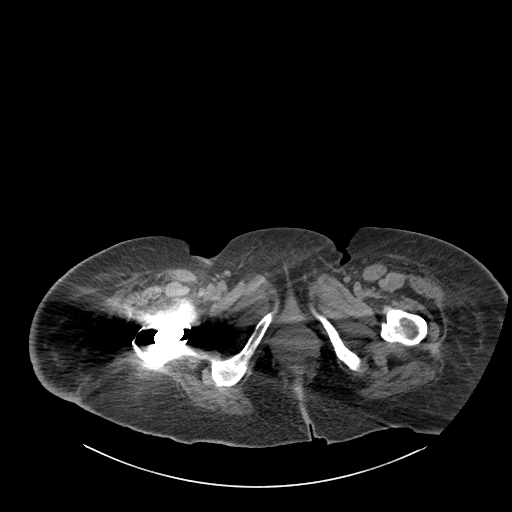
[im 4/87  bone]
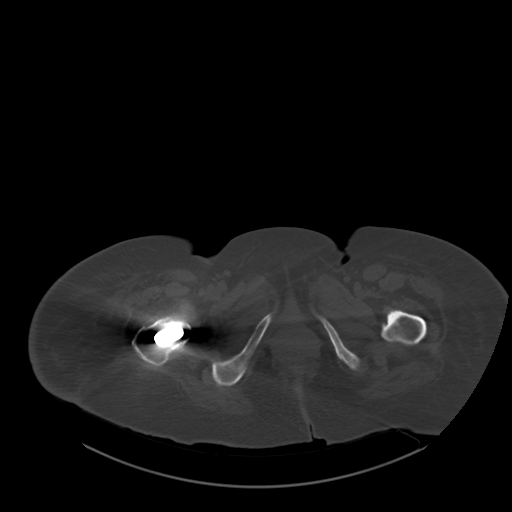
[im 12/87  soft-tissue]
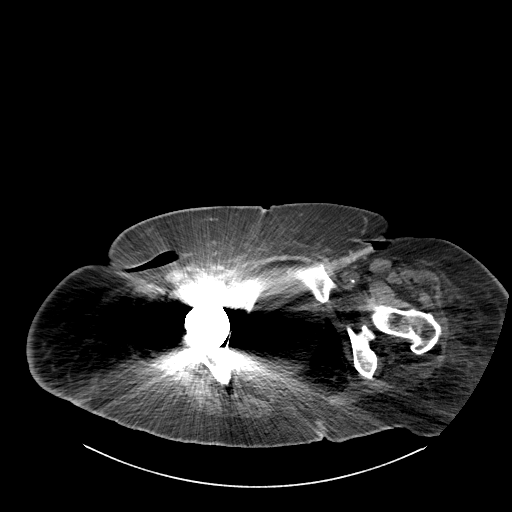
[im 19/87  soft-tissue]
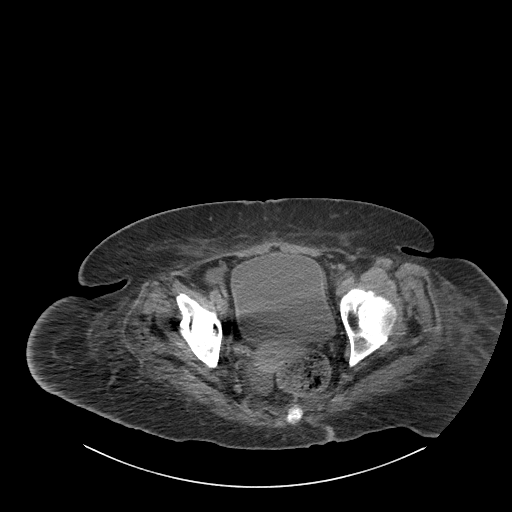
[im 23/87  soft-tissue]
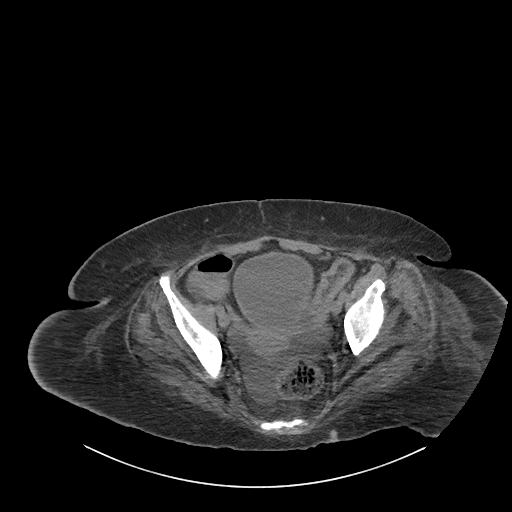
[im 30/87  soft-tissue]
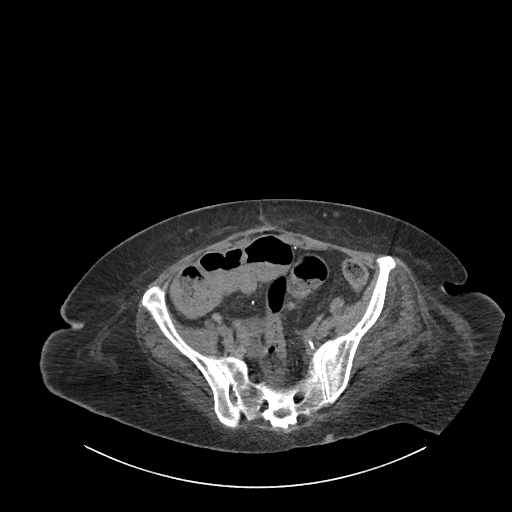
[im 38/87  soft-tissue]
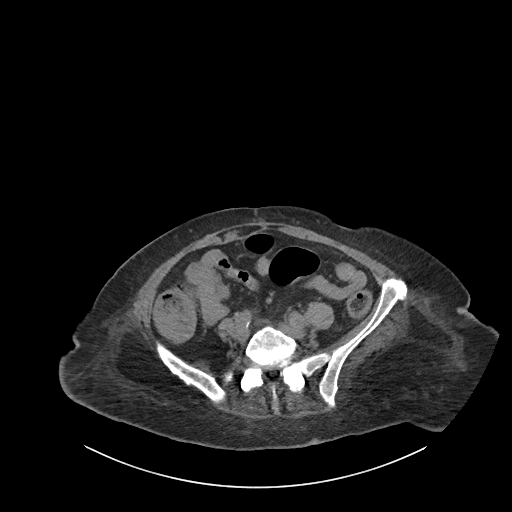
[im 45/87  soft-tissue]
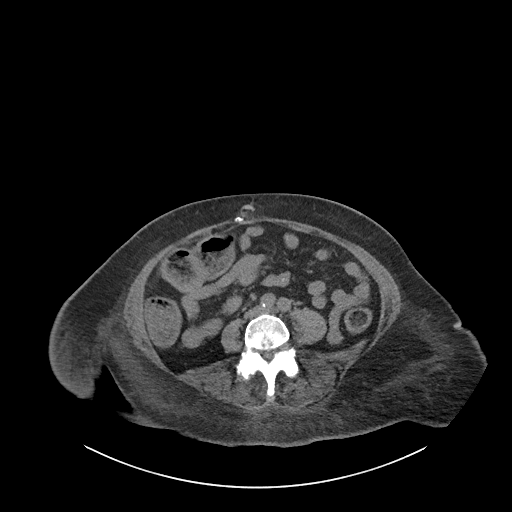
[im 49/87  soft-tissue]
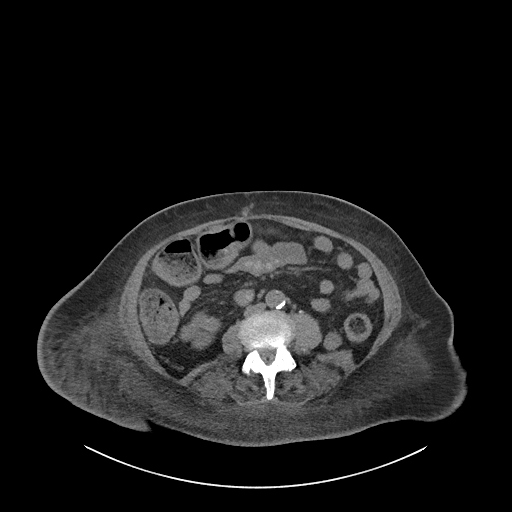
[im 57/87  soft-tissue]
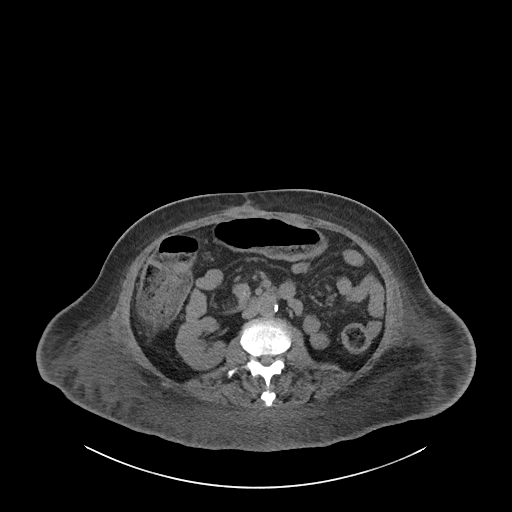
[im 57/87  bone]
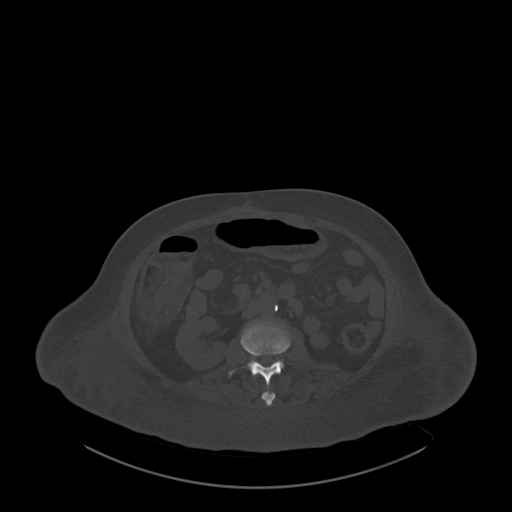
[im 64/87  soft-tissue]
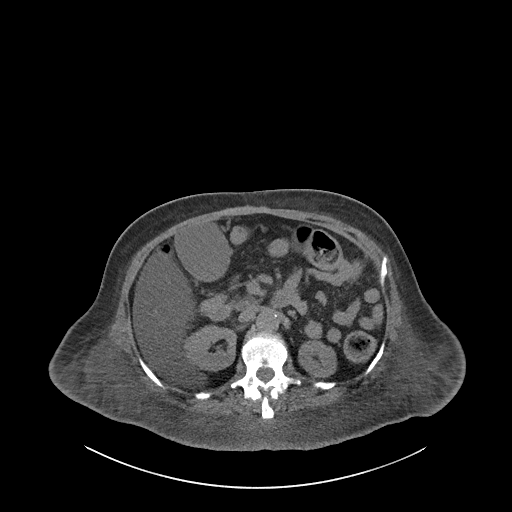
[im 68/87  soft-tissue]
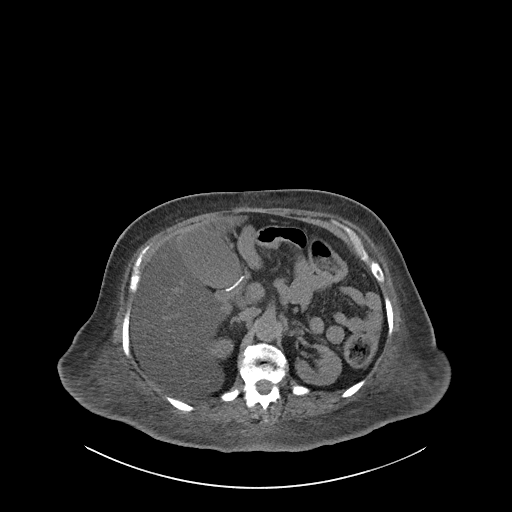
[im 75/87  soft-tissue]
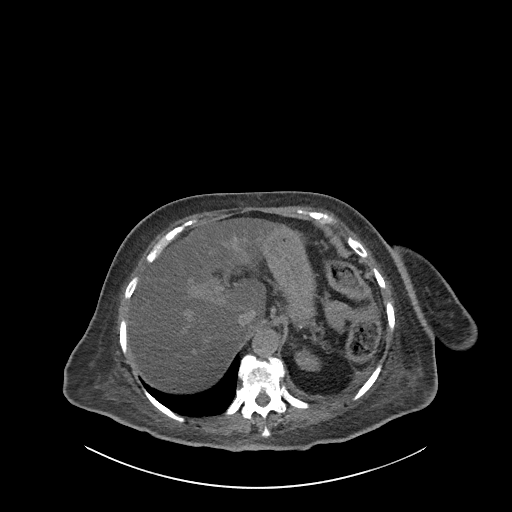
[im 83/87  soft-tissue]
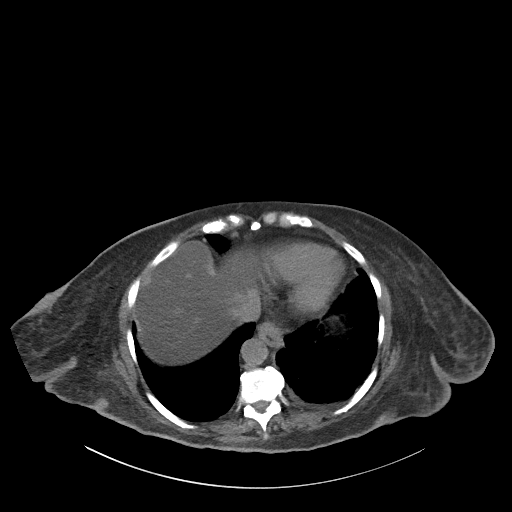

[Series 6: cor st · coronal · 0.71mm/px · 3 of 101 slices shown]
[im 34/101  soft-tissue]
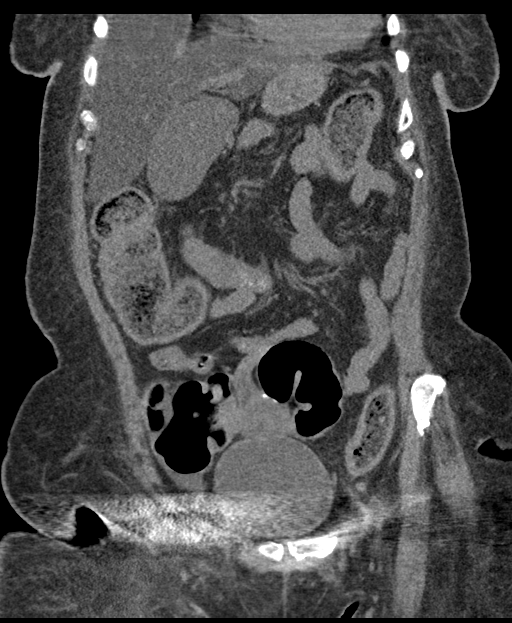
[im 45/101  soft-tissue]
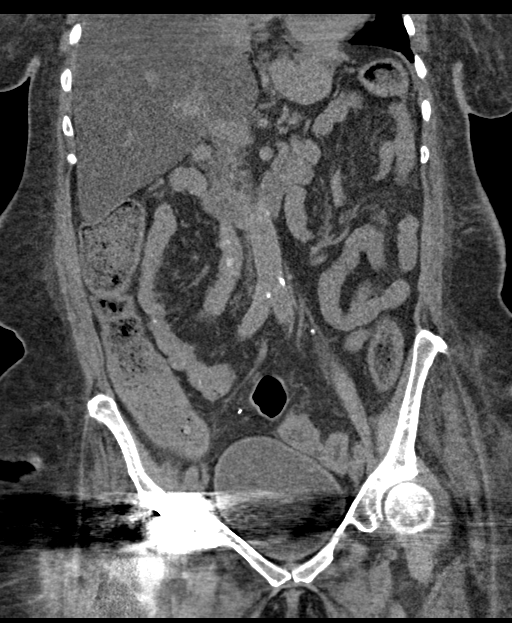
[im 56/101  soft-tissue]
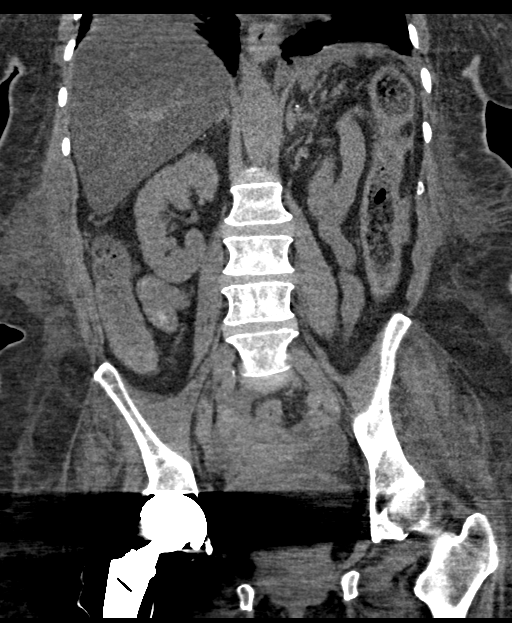

[16 of 46 positions shown; findings below may reference images not displayed]

FINDINGS: Lower chest: Patchy bilateral lower lobe ground-glass airspace
disease demonstrates minimal improvement. There is now a small left
pleural effusion.

Hepatobiliary: Stable severe fatty infiltration of the liver. No
focal abnormality on this unenhanced exam. The gallbladder is
distended without evidence of cholelithiasis or cholecystitis.

Pancreas: Diffuse atrophy, stable.  No focal abnormality.

Spleen: Normal in size without focal abnormality.

Adrenals/Urinary Tract: No urinary tract calculi or obstructive
uropathy. Mild left renal atrophy unchanged. Bladder is
unremarkable. The adrenals are normal.

Stomach/Bowel: There is mild diffuse colonic wall thickening which
may be inflammatory or infectious. No bowel obstruction or ileus.
Postsurgical changes are seen from distal gastrectomy, stable.

Vascular/Lymphatic: Aortic atherosclerosis. No enlarged abdominal or
pelvic lymph nodes.

Reproductive: Uterus and bilateral adnexa are unremarkable.

Other: There is a small amount of free fluid within the pelvis,
nonspecific. No free gas. Postsurgical changes are seen from
previous midline laparotomy.

Body wall edema is seen throughout the lower abdomen and pelvis.

Musculoskeletal: Right hip arthroplasty is identified. No acute or
destructive bony lesions. Reconstructed images demonstrate no
additional findings.
IMPRESSION: 1. Mild diffuse colonic wall thickening which may reflect
inflammatory or infectious colitis.
2. Persistent bibasilar ground-glass airspace disease compatible
with multifocal pneumonia. New small left effusion.
3. Small amount of free fluid within the pelvis, nonspecific.
4. Diffuse fatty infiltration of the liver.

## 2021-02-04 IMAGING — DX DG CHEST 1V PORT
1 series · 1 of 1 positions shown · non-contrast
Comparison: Chest x-ray dated September 17, 2019.

CLINICAL DATA: Hypoxia.

EXAM:
PORTABLE CHEST 1 VIEW

[chest]
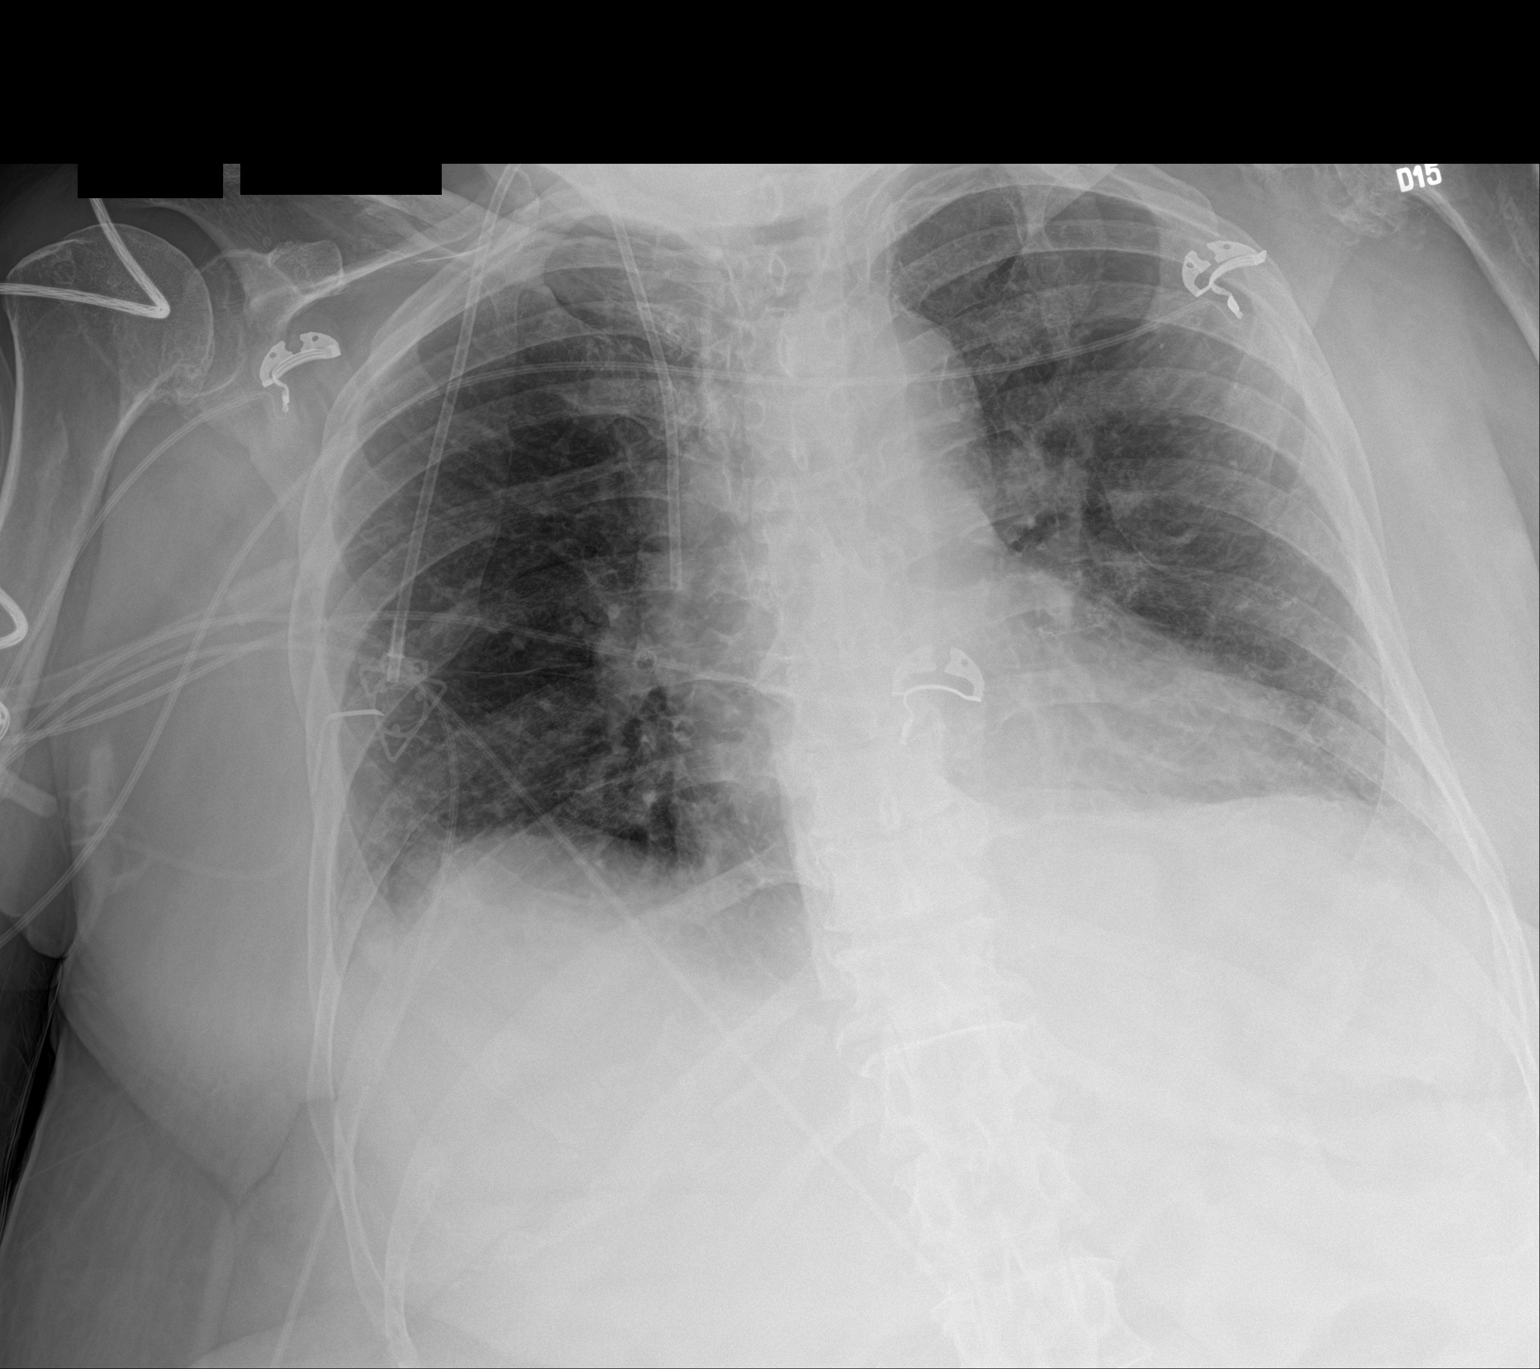

[1 of 1 positions shown; findings below may reference images not displayed]

FINDINGS: Unchanged right chest wall port catheter. The heart size and
mediastinal contours are within normal limits. Normal pulmonary
vascularity. Low lung volumes. No focal consolidation, pleural
effusion, or pneumothorax. No acute osseous abnormality.
IMPRESSION: No active disease.

## 2021-02-05 IMAGING — CT CT CHEST W/O CM
2 of 4 series · 15 of 36 positions shown, 18 images · non-contrast
Comparison: 10/24/2019, 10/22/2019, 11/02/2017

CLINICAL DATA: Hypoxia, multifocal pneumonia on abdominal CT,
history of COVID-13 August, 2019

EXAM:
CT CHEST WITHOUT CONTRAST
TECHNIQUE: Multidetector CT imaging of the chest was performed following the
standard protocol without IV contrast.

[Series 3: chest wo · axial · 0.85mm/px · z∈[+1184,+1404]mm · 12 of 132 slices shown, 15 images]
[im 11/132  mediastinal]
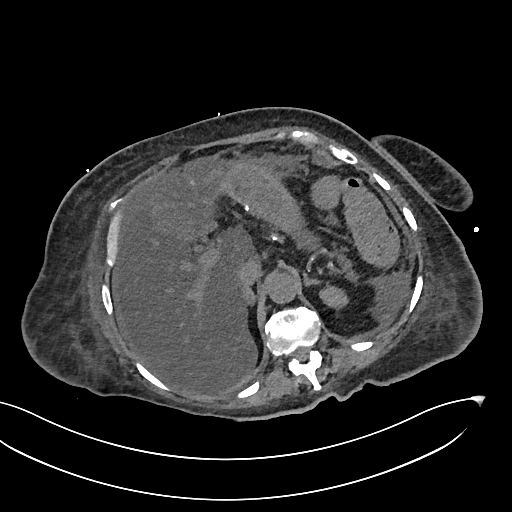
[im 11/132  lung]
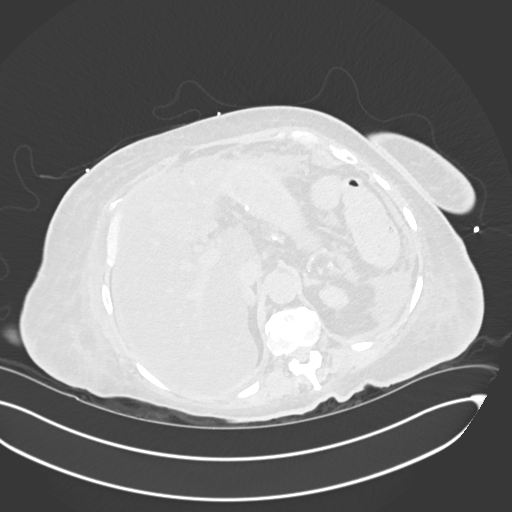
[im 21/132  lung]
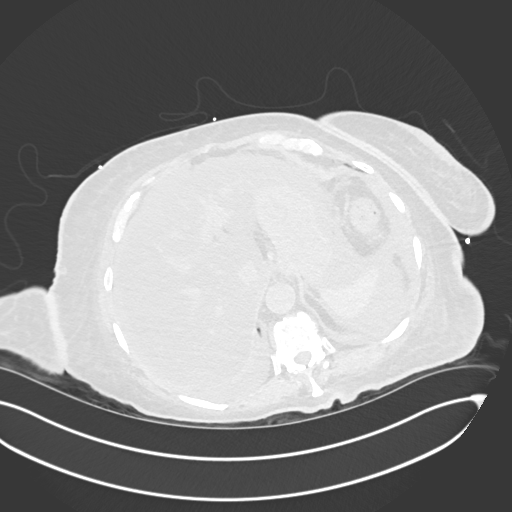
[im 31/132  lung]
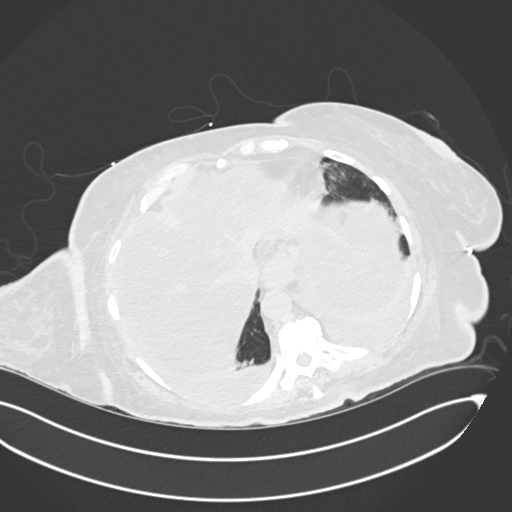
[im 41/132  lung]
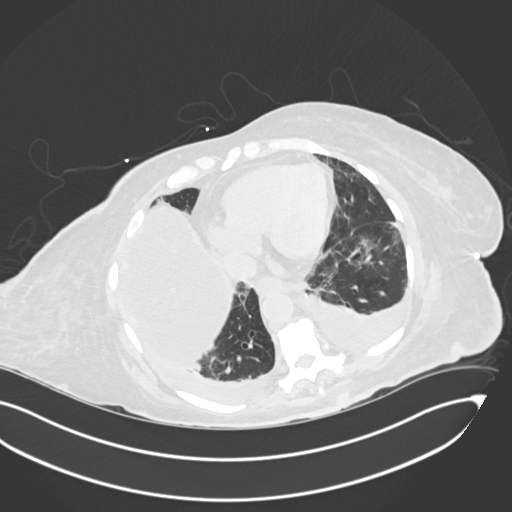
[im 51/132  mediastinal]
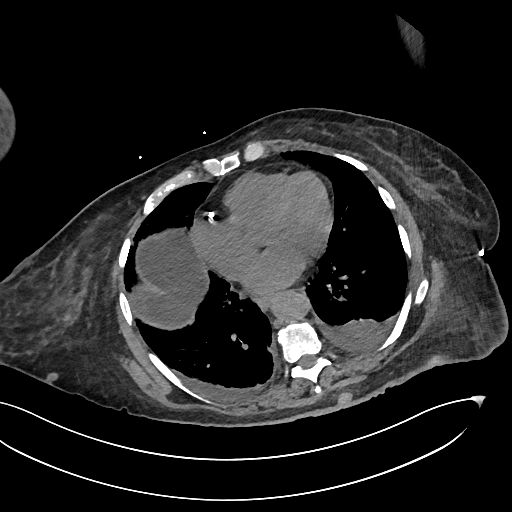
[im 51/132  lung]
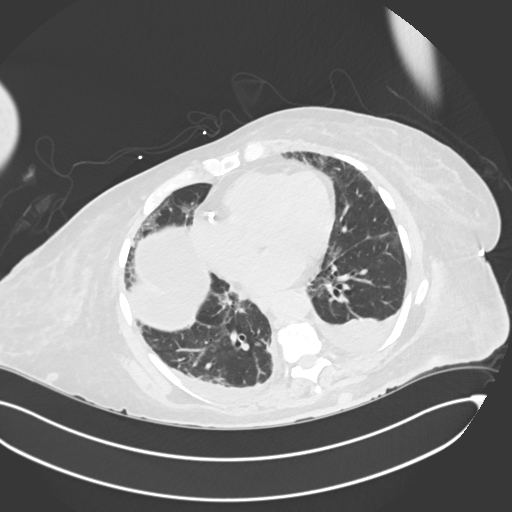
[im 61/132  lung]
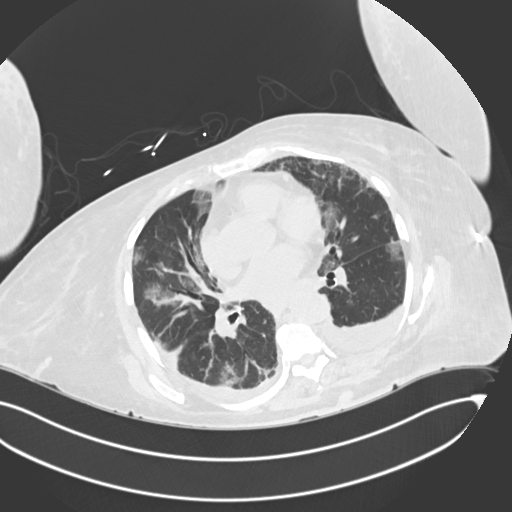
[im 71/132  lung]
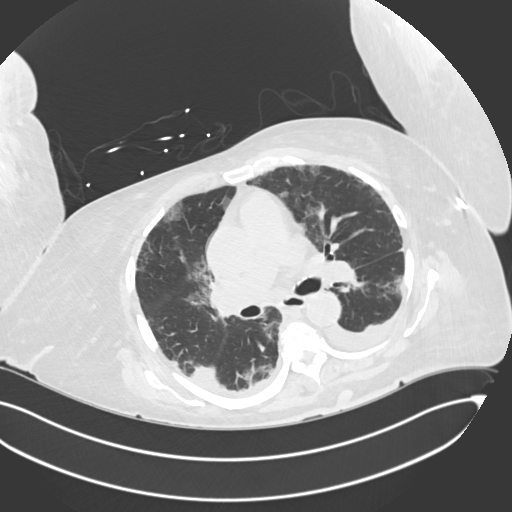
[im 81/132  lung]
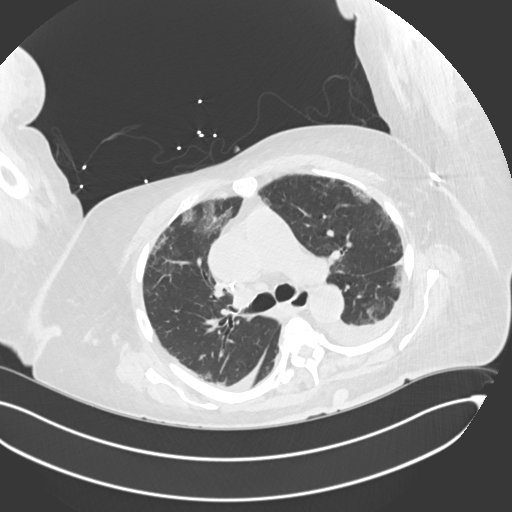
[im 91/132  mediastinal]
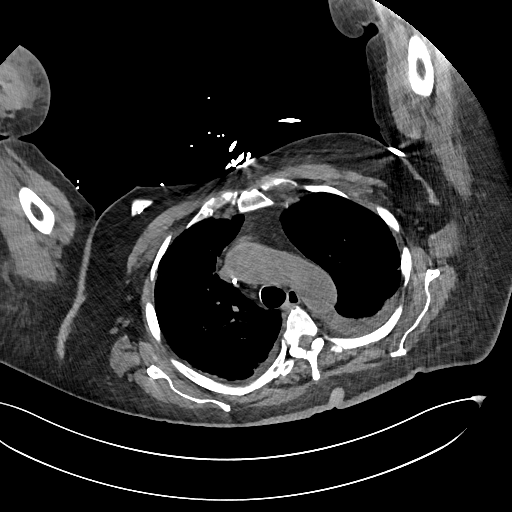
[im 91/132  lung]
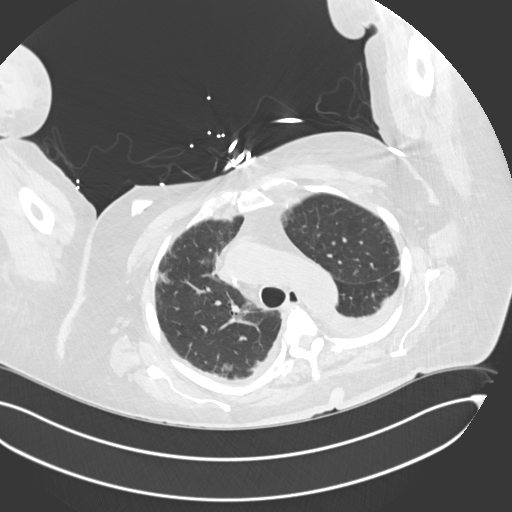
[im 101/132  lung]
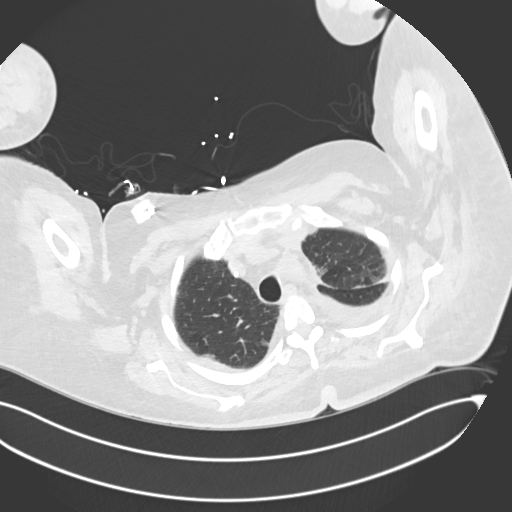
[im 111/132  lung]
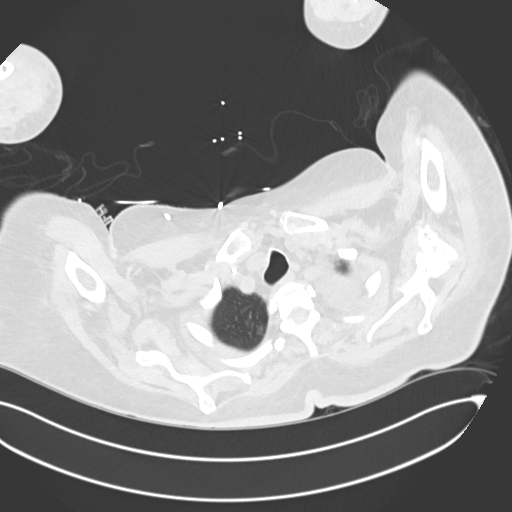
[im 121/132  lung]
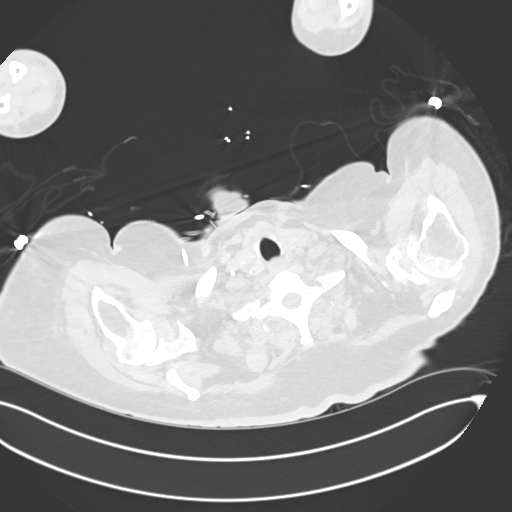

[Series 6: cor · coronal · 0.56mm/px · 3 of 129 slices shown]
[im 26/129  lung]
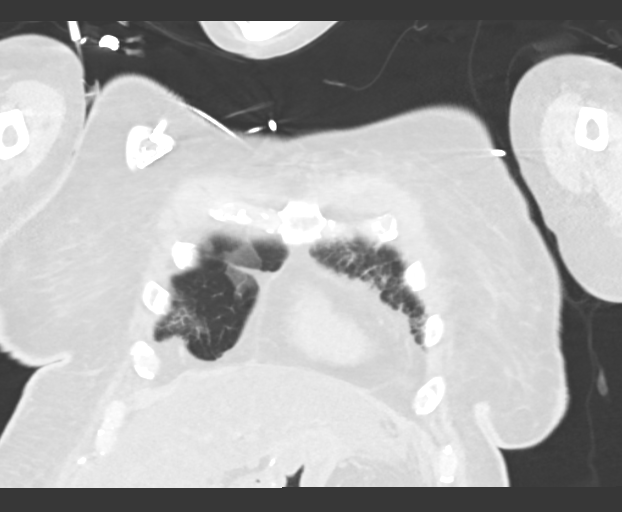
[im 52/129  lung]
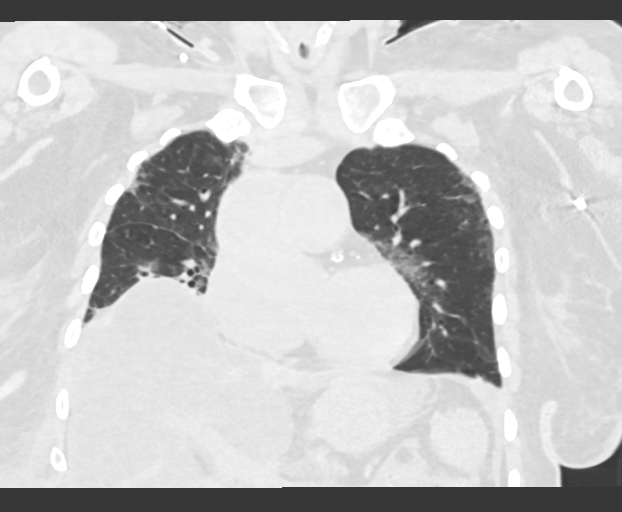
[im 77/129  lung]
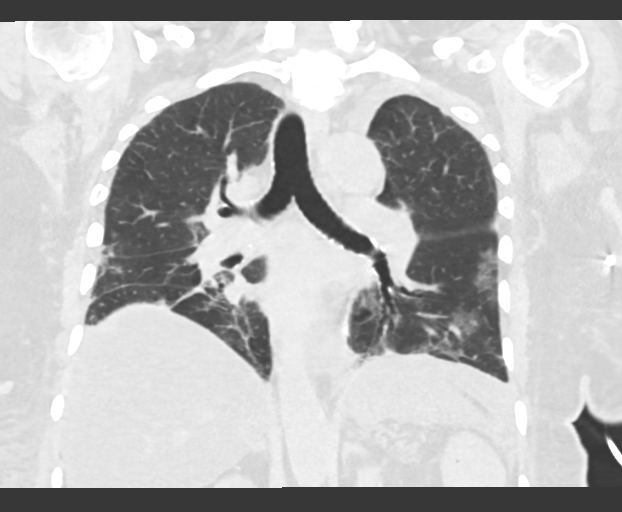

[15 of 36 positions shown; findings below may reference images not displayed]

FINDINGS: Cardiovascular: The heart is normal in size without pericardial
effusion. There is severe atherosclerosis of the coronary
vasculature. Mild atherosclerosis of the aortic arch. Thoracic aorta
is normal in caliber.

Mediastinum/Nodes: No pathologic adenopathy. Thyroid, trachea, and
esophagus are stable.

Lungs/Pleura: There are patchy bilateral areas of ground-glass
airspace disease. Overall, in comparison to abdominal CT 08/08/2019,
there is only minimal interval improvement. Small bilateral pleural
effusions have developed. No pneumothorax. The central airways are
patent.

Upper Abdomen: Stable fatty infiltration of the liver. Small amount
of ascites now seen within the upper abdomen.

Musculoskeletal: There is mild diffuse subcutaneous edema. No acute
displaced fracture. Reconstructed images demonstrate no additional
findings.
IMPRESSION: 1. Persistent multifocal areas of ground-glass airspace disease.
This could reflect persistent pneumonia or postinflammatory
scarring, given history of [id number] infection July 2019.
2. Interval development of small bilateral pleural effusions, volume
estimated less than 500 cc each.
3. Interval development of small volume ascites within the upper
abdomen.
4. Fatty liver.
5. Aortic Atherosclerosis ([id number]). Severe coronary
atherosclerosis.

## 2021-02-14 IMAGING — DX DG CHEST 1V PORT
1 series · 1 of 1 positions shown · non-contrast
Comparison: 10/24/2019

CLINICAL DATA: Altered mental status.

EXAM:
PORTABLE CHEST 1 VIEW

[chest ap]
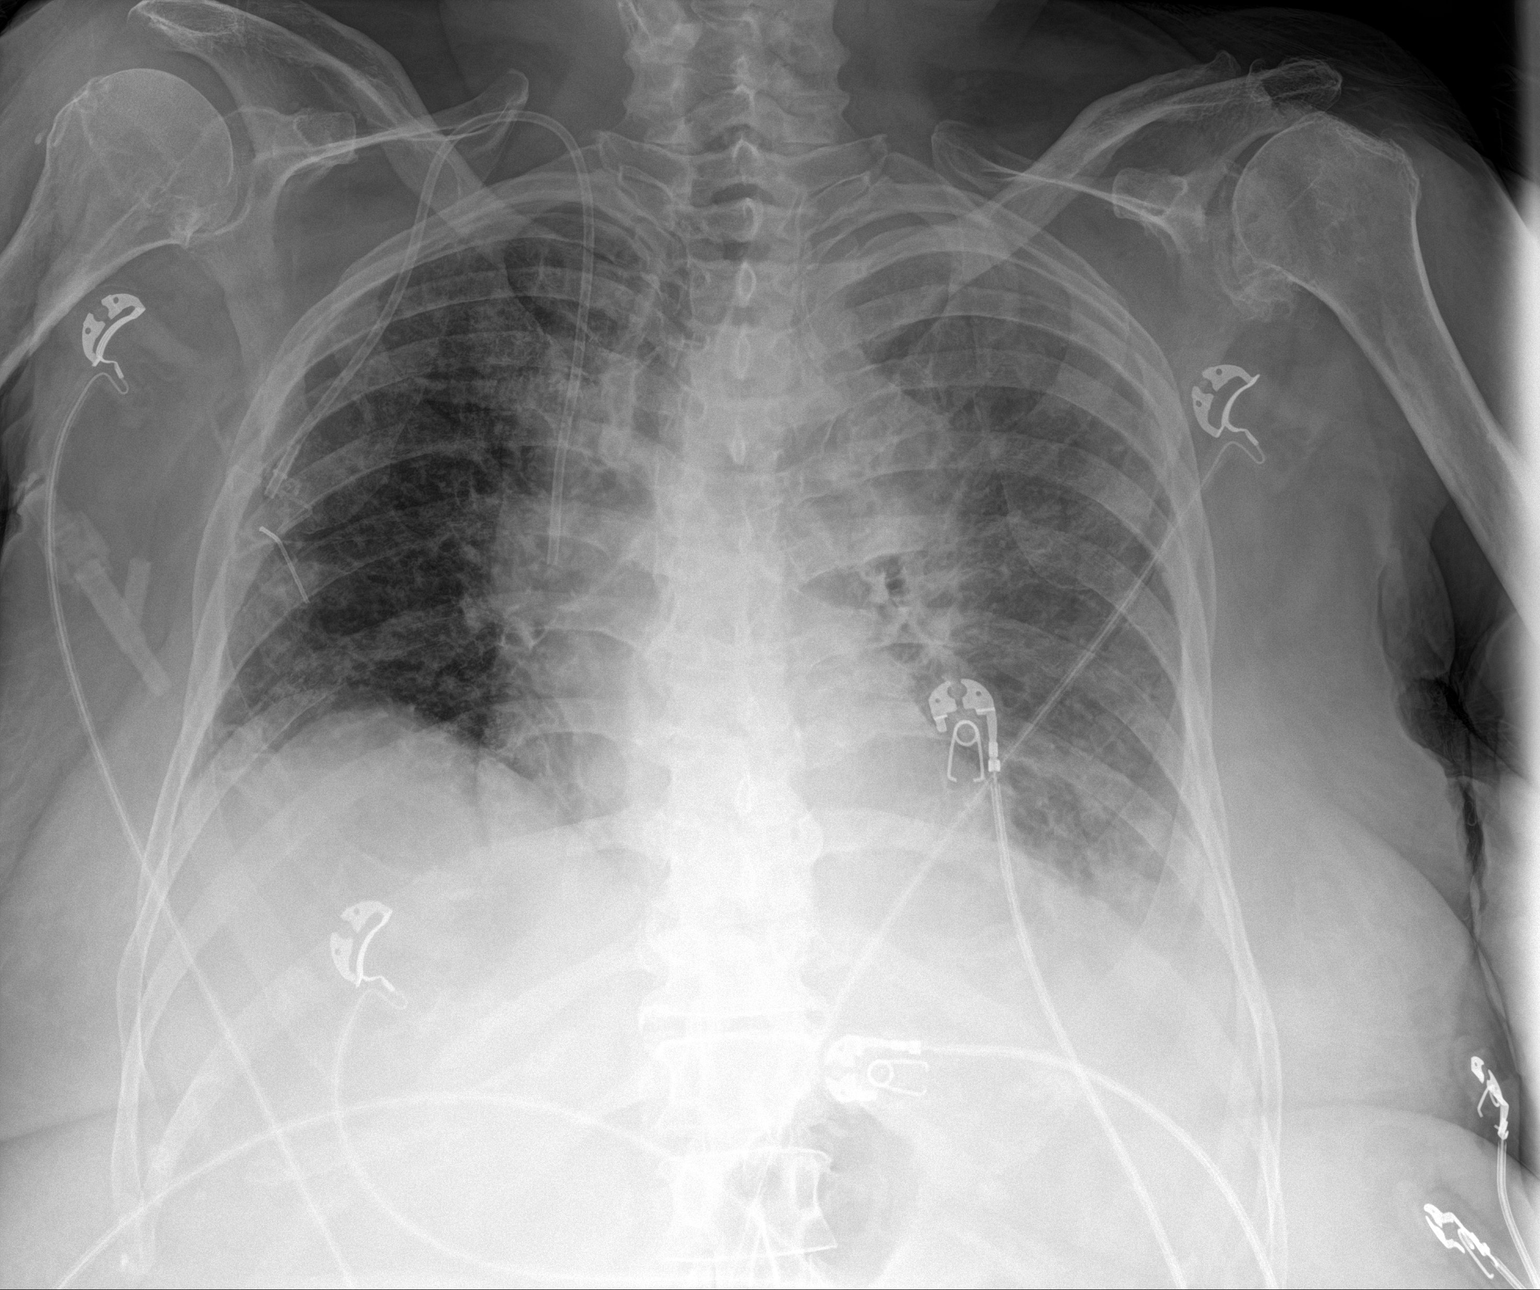

[1 of 1 positions shown; findings below may reference images not displayed]

FINDINGS: Right IJ Port-A-Cath unchanged with tip over the SVC. Lungs are
adequately inflated with minimal hazy density over the left lung
worse in the left base and perihilar region. Possible minimal
opacification over the lateral right base. Cardiomediastinal
silhouette and remainder the exam is unchanged.
IMPRESSION: Opacification of the left base, left perihilar region and possibly
lateral right base. Findings may be due to infection with small
associated left pleural effusion.

Right IJ Port-A-Cath unchanged.

## 2021-02-14 IMAGING — CT CT ABD-PELV W/O CM
2 of 5 series · 15 of 46 positions shown, 17 images · non-contrast
Comparison: Abdominopelvic CT 10/22/2019. Chest CT 10/25/2019.

CLINICAL DATA: Abdominal distension. Altered level of
consciousness. History of urinary tract infections.

EXAM:
CT ABDOMEN AND PELVIS WITHOUT CONTRAST
TECHNIQUE: Multidetector CT imaging of the abdomen and pelvis was performed
following the standard protocol without IV contrast.

[Series 3: a/p w/o 5mm · axial · non-contrast · 0.98mm/px · z∈[+956,+1392]mm · 12 of 103 slices shown, 14 images]
[im 8/103  soft-tissue]
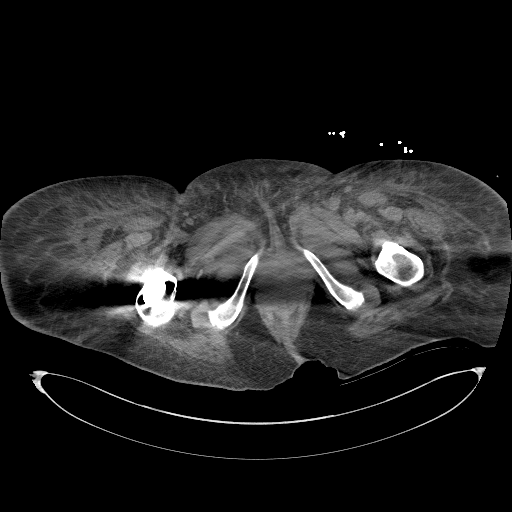
[im 8/103  bone]
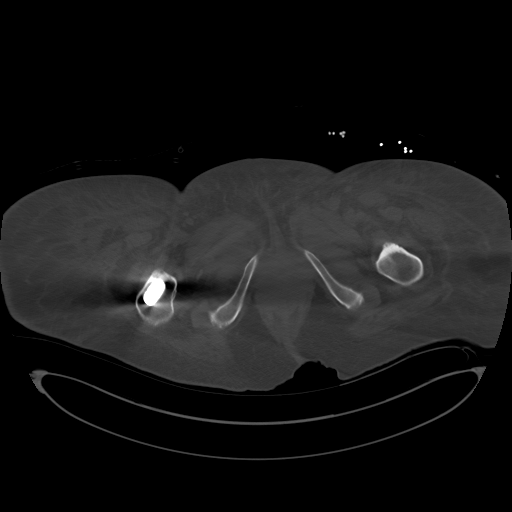
[im 16/103  soft-tissue]
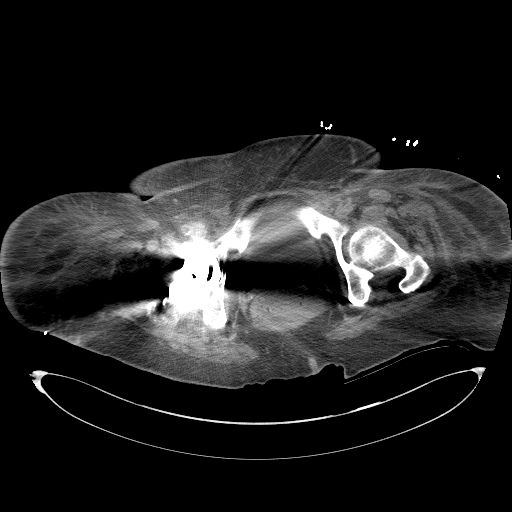
[im 24/103  soft-tissue]
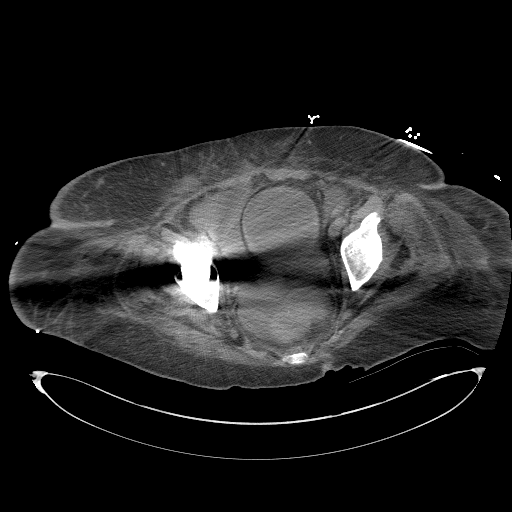
[im 32/103  soft-tissue]
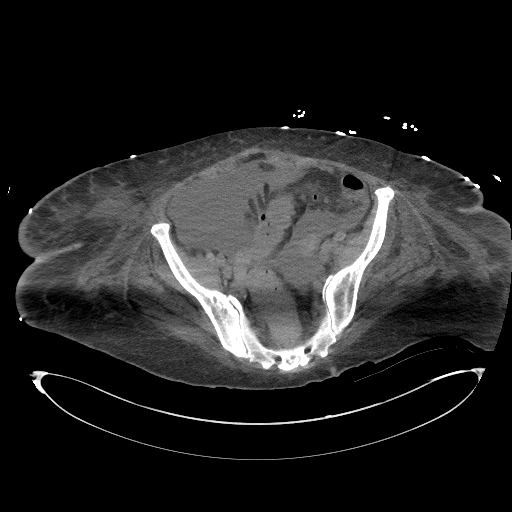
[im 40/103  soft-tissue]
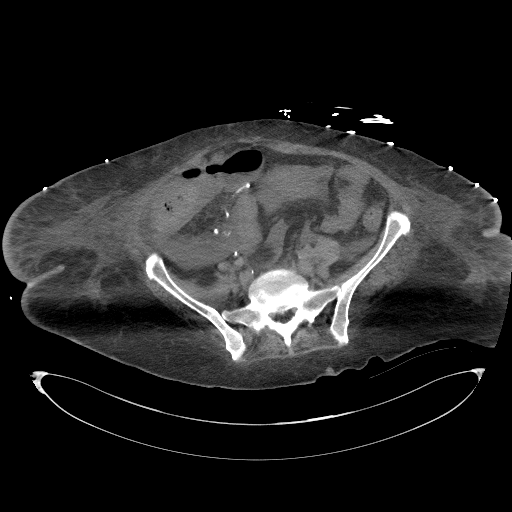
[im 48/103  soft-tissue]
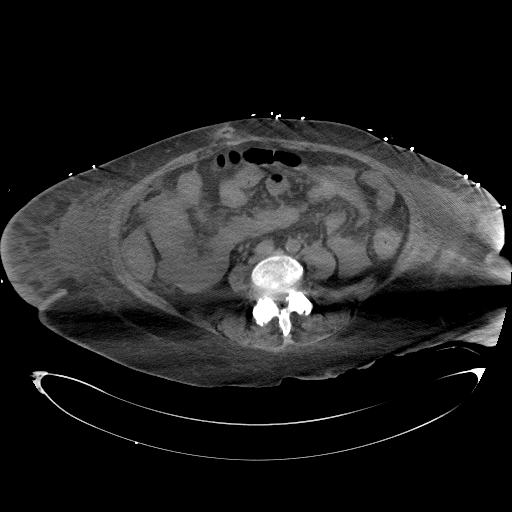
[im 55/103  soft-tissue]
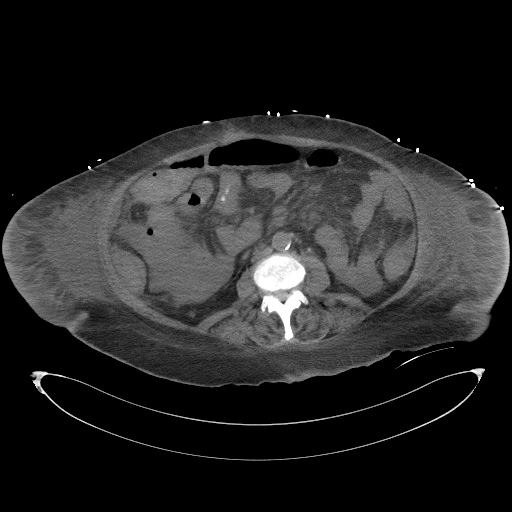
[im 63/103  soft-tissue]
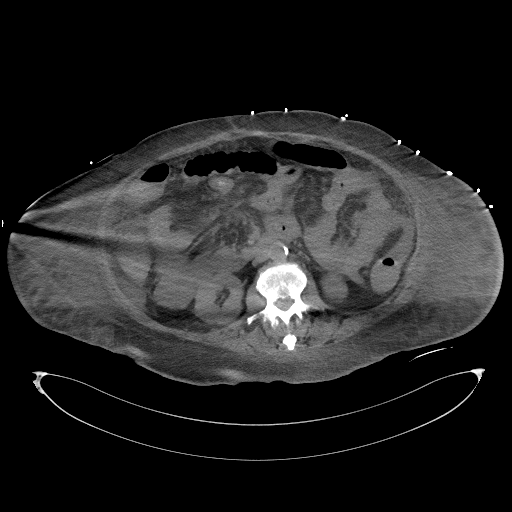
[im 71/103  soft-tissue]
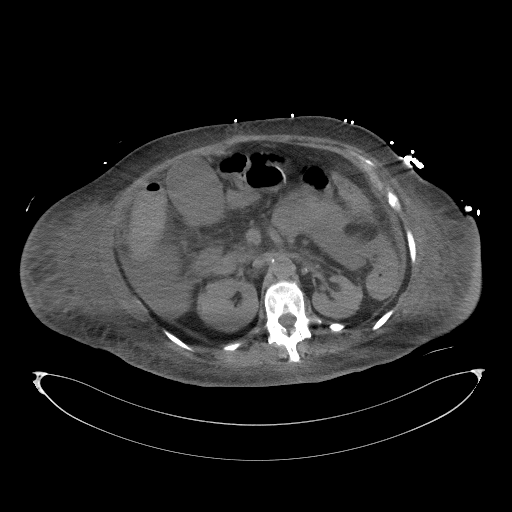
[im 71/103  bone]
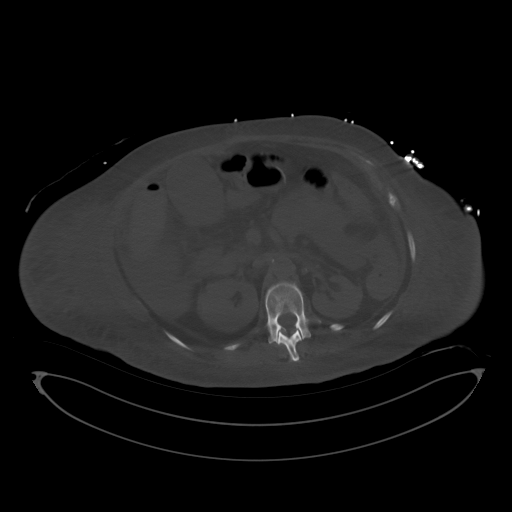
[im 79/103  soft-tissue]
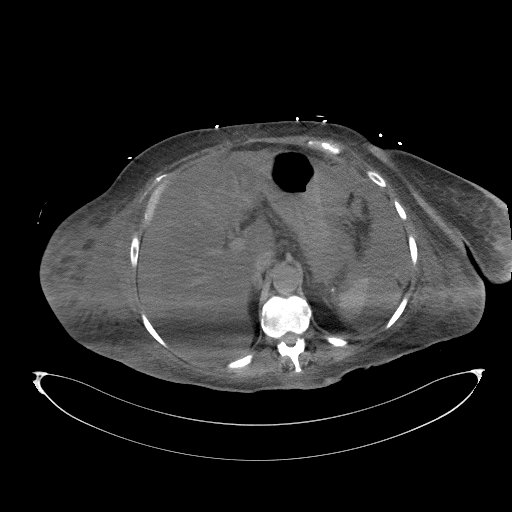
[im 87/103  soft-tissue]
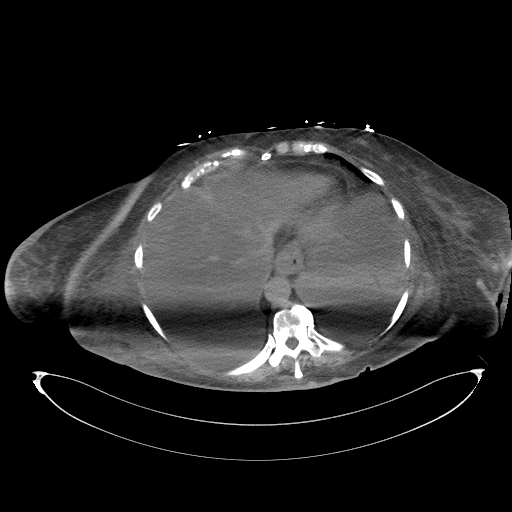
[im 95/103  soft-tissue]
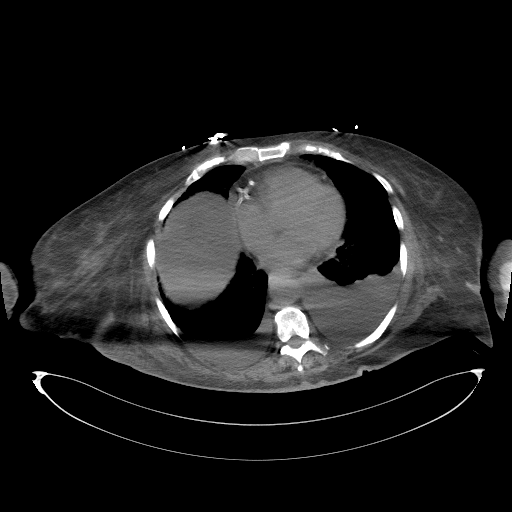

[Series 6: a/p w/o cor · coronal · non-contrast · 1.00mm/px · 3 of 151 slices shown]
[im 51/151  soft-tissue]
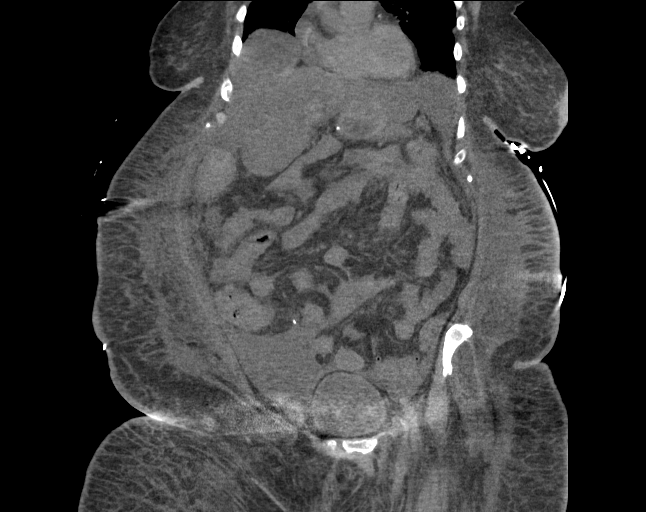
[im 67/151  soft-tissue]
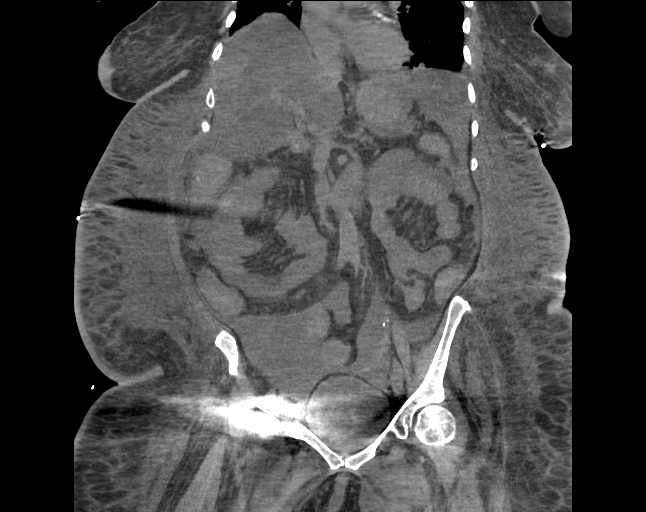
[im 84/151  soft-tissue]
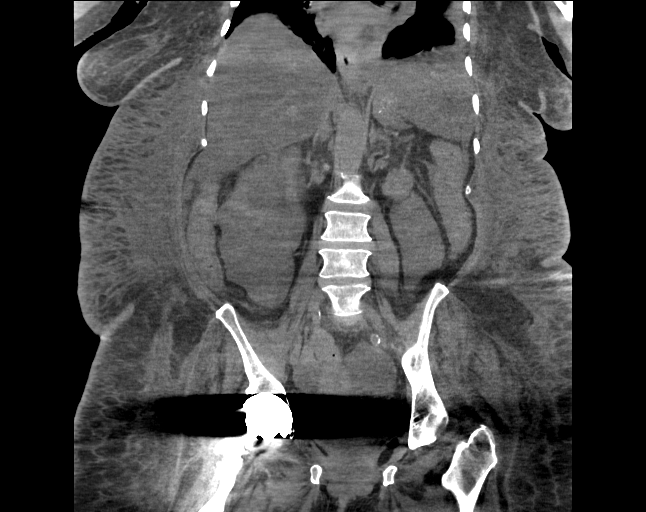

[15 of 46 positions shown; findings below may reference images not displayed]

FINDINGS: Study is limited by the lack of intravenous and enteric contrast and
diffuse soft tissue edema.

Lower chest: Enlarging moderate left and small right dependent
pleural effusions. Underlying patchy ground-glass opacities at both
lung bases, likely a combination of atelectasis and inflammation in
this patient with history of [id number] infection.

Hepatobiliary: Diffuse hepatic steatosis without focal abnormality
on noncontrast imaging. The gallbladder remains significantly
distended without calcified gallstones or focal surrounding
inflammation. No significant biliary dilatation.

Pancreas: Diffusely atrophy. No evidence of focal surrounding
inflammation or pancreatic ductal dilatation.

Spleen: Remains small without focal abnormality.

Adrenals/Urinary Tract: Both adrenal glands appear normal. Stable
mild left renal atrophy and cortical thinning. No evidence of
urinary tract calculus or hydronephrosis. The bladder is partly
obscured by artifact from the right total hip arthroplasty, although
demonstrates no abnormality.

Stomach/Bowel: Mild diffuse nonspecific bowel wall thickening,
mildly increased from previous study. Postsurgical changes in the
stomach and distal colon. No evidence of bowel obstruction.

Vascular/Lymphatic: There are no enlarged abdominal or pelvic lymph
nodes. Aortic and branch vessel atherosclerosis.

Reproductive: The uterus and ovaries appear normal. No adnexal mass.

Other: Anasarca with increased edema throughout the subcutaneous and
intra-abdominal fat. Increasing pelvic ascites. No extraluminal air
or focal fluid collection identified.

Musculoskeletal: No acute or significant osseous findings. Previous
right total hip arthroplasty. Mild lumbar spondylosis.
IMPRESSION: 1. Compared with the prior study from 10 days ago, worsening
anasarca with increased edema throughout the soft tissues and
increased pelvic ascites. Enlarging moderate left and small right
dependent pleural effusions.
2. Underlying patchy ground-glass opacities at both lung bases,
likely a combination of atelectasis and inflammation in this patient
with history of [id number] infection.
3. Nonspecific bowel wall thickening, likely related to anasarca
4. Persistent gallbladder distention without calcified gallstones or
focal surrounding inflammation.
5. Diffuse hepatic steatosis.
6. Aortic Atherosclerosis ([id number]).

## 2021-02-15 IMAGING — DX DG ABDOMEN 1V
1 series · 1 of 1 positions shown · non-contrast
Comparison: Portable exam 7644 hours compared to 09/20/2019

CLINICAL DATA: Orogastric tube placement

EXAM:
ABDOMEN - 1 VIEW

[abdomen kub]
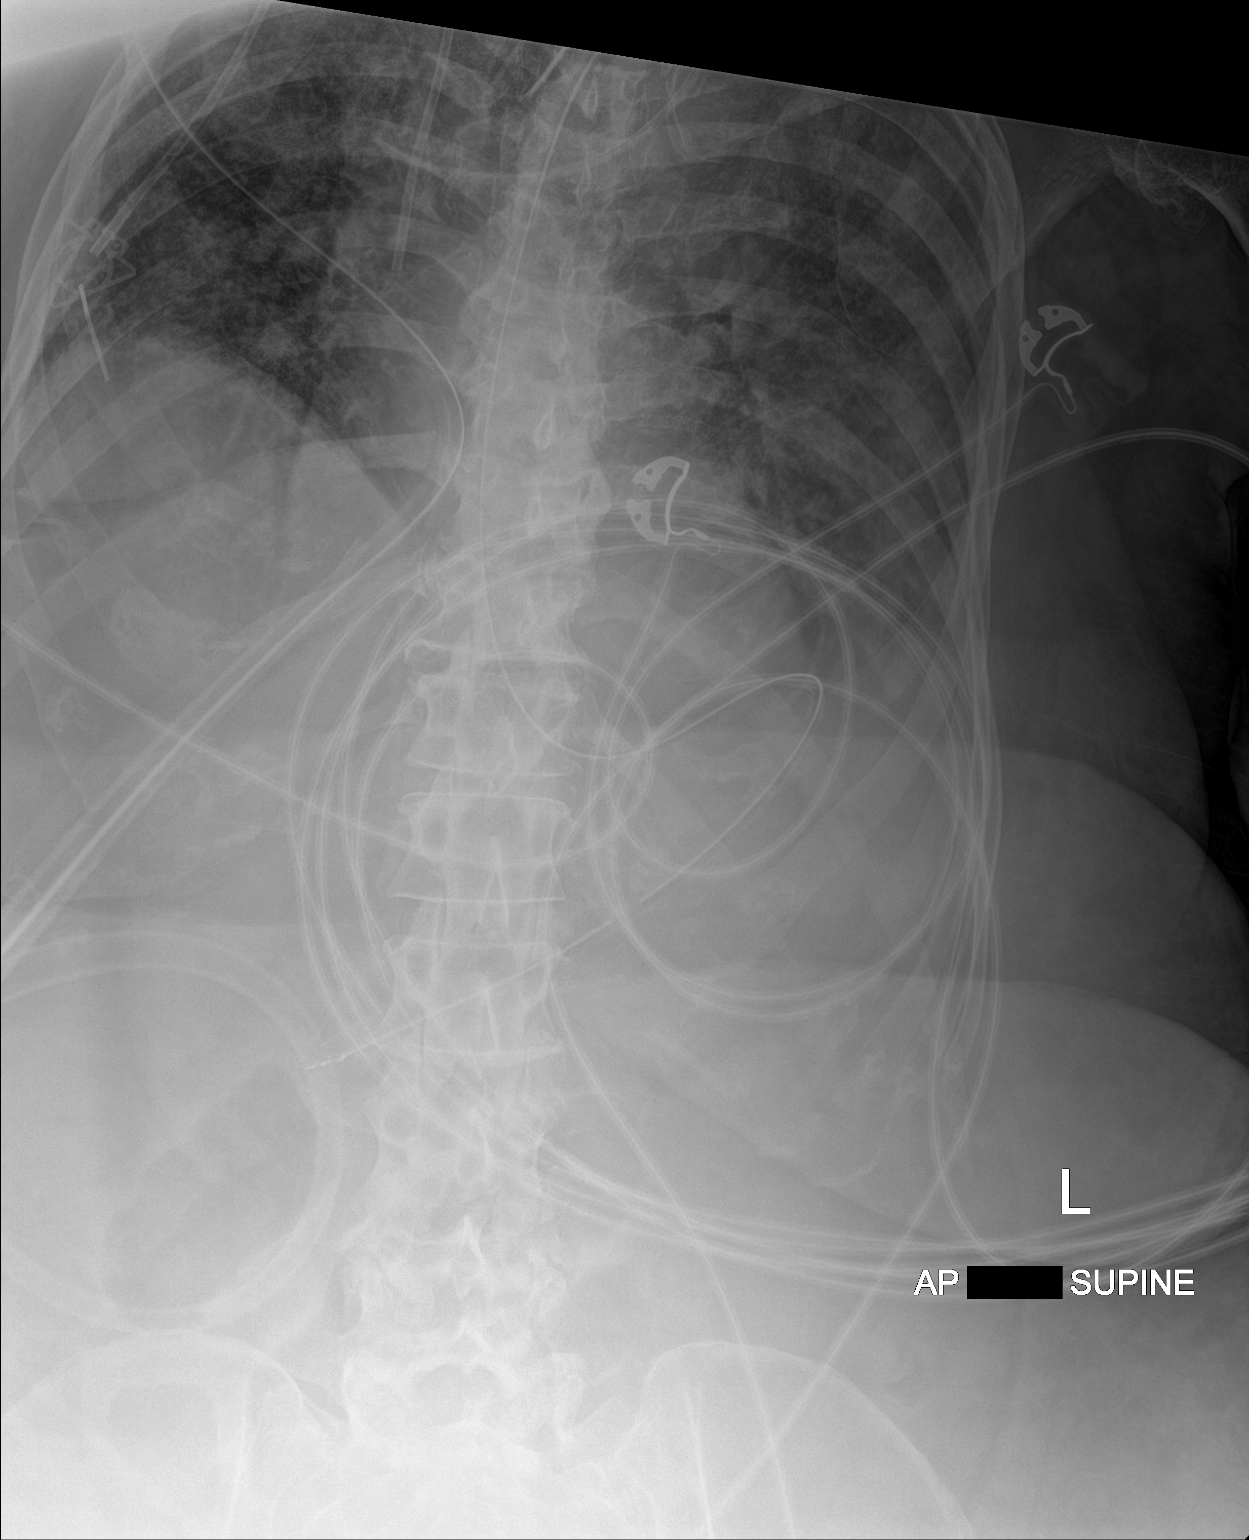

[1 of 1 positions shown; findings below may reference images not displayed]

FINDINGS: Tip of orogastric tube projects over distal gastric antrum.

Tip of endotracheal tube projects 4.2 cm above carina.

Tip of RIGHT side Port-A-Cath projects over SVC.

Numerous EKG leads project over abdomen.

Paucity of bowel gas.

BILATERAL pulmonary infiltrates.
IMPRESSION: Tip of orogastric tube projects over distal gastric antrum.

## 2021-02-15 IMAGING — DX DG CHEST 1V PORT
1 series · 1 of 1 positions shown · non-contrast
Comparison: 11/04/2019, [DATE] p.m.

CLINICAL DATA: Central line placement

EXAM:
PORTABLE CHEST 1 VIEW

[chest ap]
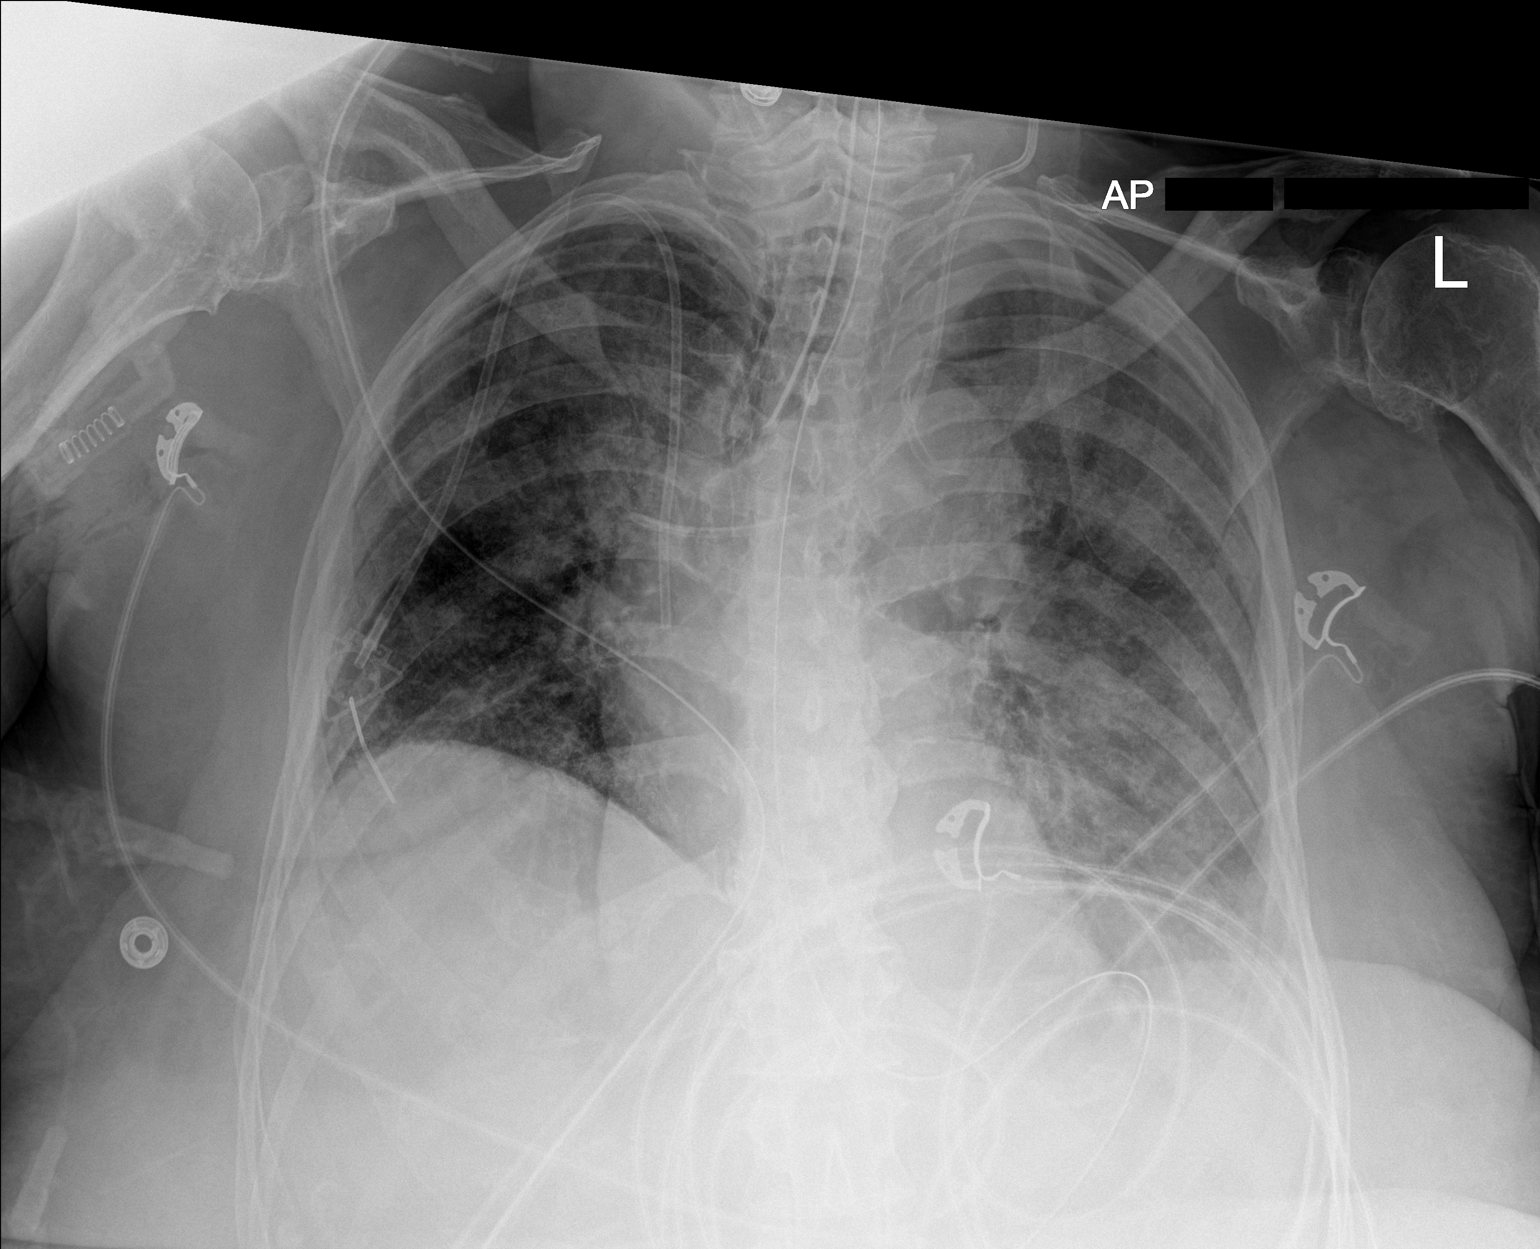

[1 of 1 positions shown; findings below may reference images not displayed]

FINDINGS: There has been interval repositioning of a left neck vascular
catheter, which now appears appropriately positioned, tip projecting
near the confluence of the brachiocephalic veins. Interval placement
of esophagogastric tube, tip and side port below the diaphragm.
Unchanged position of endotracheal tube, above the carina. Improved
aeration of the right lung base. Multifocal heterogeneous and
interstitial airspace opacity is otherwise unchanged.
IMPRESSION: 1. There has been interval repositioning of a left neck vascular
catheter, which now appears appropriately positioned, tip projecting
near the confluence of the brachiocephalic veins.

2. Interval placement of esophagogastric tube, tip and side port
below the diaphragm.

## 2021-02-15 IMAGING — DX DG CHEST 1V PORT
1 series · 1 of 1 positions shown · non-contrast
Comparison: 11/04/2019

CLINICAL DATA: Intubation.

EXAM:
PORTABLE CHEST 1 VIEW

[chest]
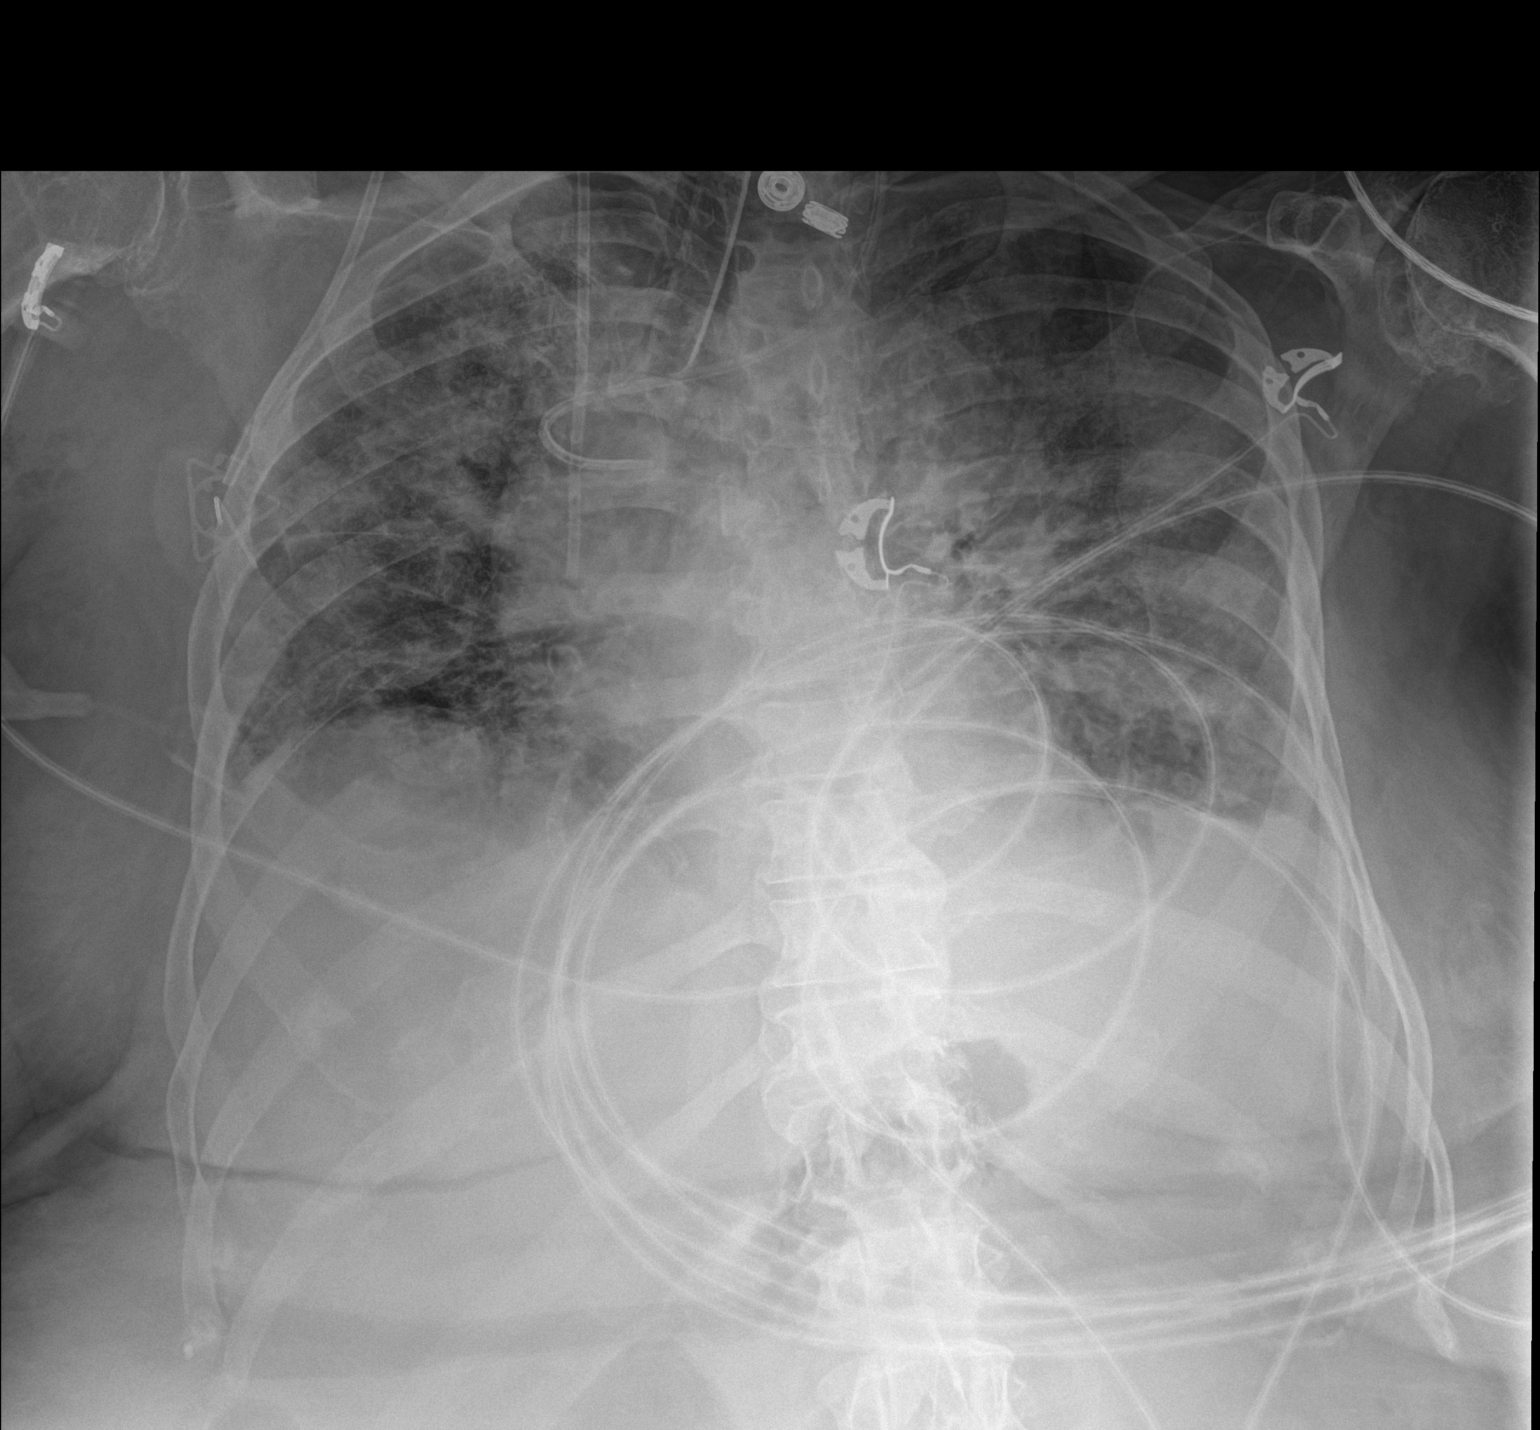

[1 of 1 positions shown; findings below may reference images not displayed]

FINDINGS: Endotracheal tube has tip approximately 4.5 cm above the carina.
Right IJ central venous catheter unchanged with tip over the SVC.
Left IJ central venous catheter is coiled over the SVC with tip
directed towards the azygos vein.

Lungs are somewhat hypoinflated with continued moderate patchy
bilateral airspace opacification with slight worsening over the
right upper lobe. No definite effusion. No pneumothorax.
Cardiomediastinal silhouette and remainder the exam is unchanged.
IMPRESSION: 1. Persistent patchy bilateral airspace opacification with slight
worsening over the right upper lobe. Findings likely due to
multifocal pneumonia.

2. Tubes and lines as described. Note that the left IJ central
venous catheter has tip coiled over the SVC with tip directed
towards the region of the azygos vein. Consider lateral chest x-ray
for better evaluation versus retraction of the catheter
approximately 3-4 cm.

These results were called by telephone at the time of interpretation
on 11/04/2019 at [DATE] to patient's nurse, Savio Locklear, who
verbally acknowledged these results.

## 2021-02-15 IMAGING — DX DG CHEST 1V PORT
1 series · 1 of 1 positions shown · non-contrast
Comparison: November 03, 2019

CLINICAL DATA: Shortness of breath

EXAM:
PORTABLE CHEST 1 VIEW

[chest ap]
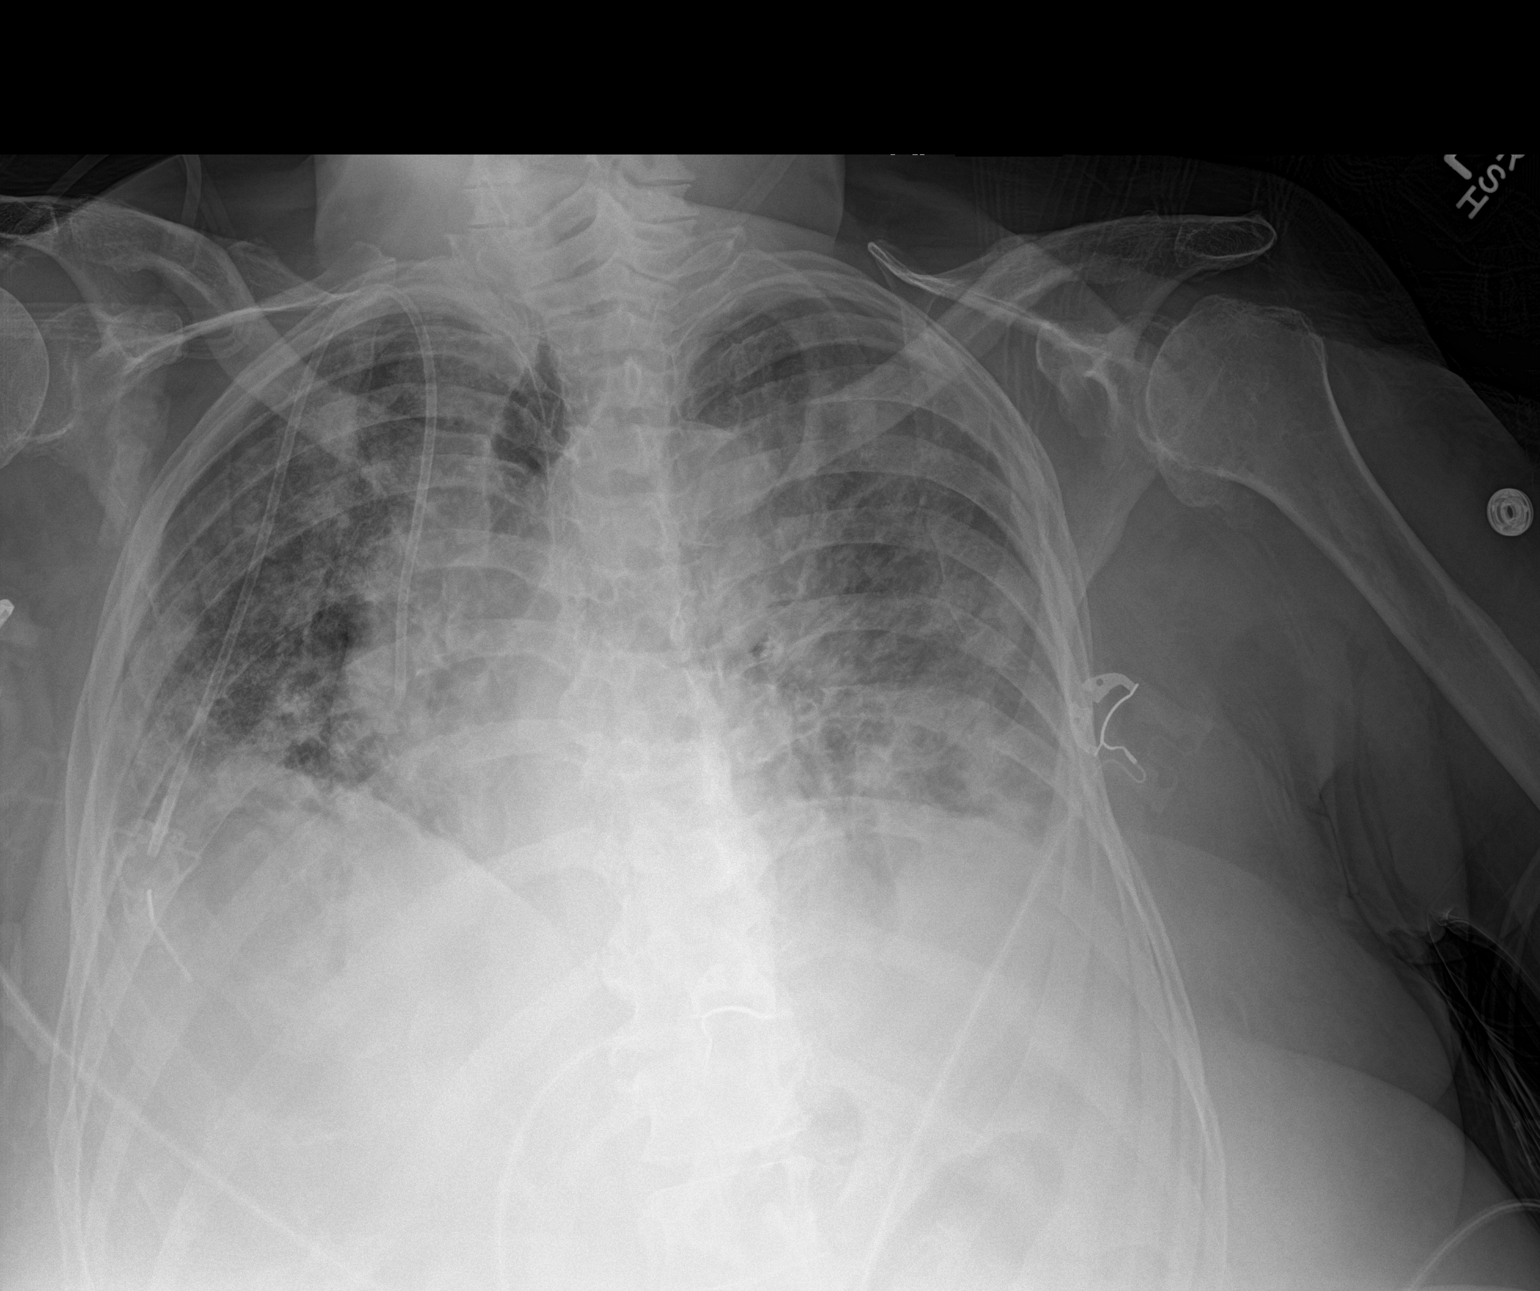

[1 of 1 positions shown; findings below may reference images not displayed]

FINDINGS: Port-A-Cath tip is in the superior vena cava near the cavoatrial
junction. No pneumothorax. There is airspace opacity throughout each
lung, consistent with multifocal pneumonia. Heart size and pulmonary
vascularity are normal. No adenopathy. There is degenerative change
in each shoulder.
IMPRESSION: Airspace opacity throughout the lungs bilaterally consistent with
multifocal pneumonia. Overall slight increase in opacity in the base
regions compared to 1 day prior. Stable cardiac silhouette.
Port-A-Cath tip in superior vena cava. No pneumothorax.
